# Patient Record
Sex: Male | Born: 1950 | Race: Black or African American | Hispanic: No | Marital: Single | Smoking: Former smoker
Health system: Southern US, Community
[De-identification: ages and names within clinical notes are randomized; demographics above are authoritative.]

## PROBLEM LIST (undated history)

## (undated) DIAGNOSIS — I219 Acute myocardial infarction, unspecified: Secondary | ICD-10-CM

## (undated) DIAGNOSIS — E785 Hyperlipidemia, unspecified: Secondary | ICD-10-CM

## (undated) DIAGNOSIS — I509 Heart failure, unspecified: Secondary | ICD-10-CM

## (undated) DIAGNOSIS — Z992 Dependence on renal dialysis: Secondary | ICD-10-CM

## (undated) DIAGNOSIS — M109 Gout, unspecified: Secondary | ICD-10-CM

## (undated) DIAGNOSIS — I10 Essential (primary) hypertension: Secondary | ICD-10-CM

## (undated) DIAGNOSIS — F419 Anxiety disorder, unspecified: Secondary | ICD-10-CM

## (undated) DIAGNOSIS — N189 Chronic kidney disease, unspecified: Secondary | ICD-10-CM

## (undated) DIAGNOSIS — Z89511 Acquired absence of right leg below knee: Secondary | ICD-10-CM

## (undated) DIAGNOSIS — D649 Anemia, unspecified: Secondary | ICD-10-CM

## (undated) DIAGNOSIS — Z972 Presence of dental prosthetic device (complete) (partial): Secondary | ICD-10-CM

## (undated) DIAGNOSIS — G709 Myoneural disorder, unspecified: Secondary | ICD-10-CM

## (undated) DIAGNOSIS — I739 Peripheral vascular disease, unspecified: Secondary | ICD-10-CM

---

## 2008-05-09 DIAGNOSIS — I219 Acute myocardial infarction, unspecified: Secondary | ICD-10-CM

## 2008-05-09 HISTORY — DX: Acute myocardial infarction, unspecified: I21.9

## 2009-06-12 ENCOUNTER — Inpatient Hospital Stay: Payer: Self-pay | Admitting: Internal Medicine

## 2009-06-24 ENCOUNTER — Emergency Department: Payer: Self-pay | Admitting: Emergency Medicine

## 2009-06-29 ENCOUNTER — Inpatient Hospital Stay: Payer: Self-pay | Admitting: Internal Medicine

## 2009-07-09 ENCOUNTER — Emergency Department: Payer: Self-pay | Admitting: Emergency Medicine

## 2009-07-31 ENCOUNTER — Emergency Department: Payer: Self-pay | Admitting: Emergency Medicine

## 2009-08-10 ENCOUNTER — Emergency Department: Payer: Self-pay | Admitting: Emergency Medicine

## 2009-08-18 ENCOUNTER — Inpatient Hospital Stay: Payer: Self-pay | Admitting: Internal Medicine

## 2009-08-25 ENCOUNTER — Inpatient Hospital Stay: Payer: Self-pay | Admitting: Internal Medicine

## 2009-09-17 ENCOUNTER — Ambulatory Visit: Payer: Self-pay | Admitting: Vascular Surgery

## 2009-09-24 ENCOUNTER — Ambulatory Visit: Payer: Self-pay | Admitting: Vascular Surgery

## 2009-10-14 ENCOUNTER — Emergency Department: Payer: Self-pay | Admitting: Emergency Medicine

## 2009-10-17 ENCOUNTER — Emergency Department: Payer: Self-pay | Admitting: Internal Medicine

## 2009-10-21 ENCOUNTER — Ambulatory Visit: Payer: Self-pay | Admitting: Vascular Surgery

## 2009-12-22 ENCOUNTER — Inpatient Hospital Stay: Payer: Self-pay | Admitting: Internal Medicine

## 2010-01-08 ENCOUNTER — Emergency Department: Payer: Self-pay | Admitting: Emergency Medicine

## 2010-01-21 ENCOUNTER — Ambulatory Visit: Payer: Self-pay | Admitting: Internal Medicine

## 2010-06-17 ENCOUNTER — Ambulatory Visit: Payer: Self-pay | Admitting: Vascular Surgery

## 2010-08-31 ENCOUNTER — Ambulatory Visit: Payer: Self-pay | Admitting: Vascular Surgery

## 2010-11-14 ENCOUNTER — Emergency Department: Payer: Self-pay | Admitting: Internal Medicine

## 2010-12-06 ENCOUNTER — Ambulatory Visit: Payer: Self-pay | Admitting: Vascular Surgery

## 2011-01-19 ENCOUNTER — Inpatient Hospital Stay: Payer: Self-pay | Admitting: Internal Medicine

## 2011-02-17 ENCOUNTER — Ambulatory Visit: Payer: Self-pay | Admitting: Vascular Surgery

## 2011-06-11 ENCOUNTER — Emergency Department: Payer: Self-pay | Admitting: Emergency Medicine

## 2011-06-11 LAB — BASIC METABOLIC PANEL
Anion Gap: 15 (ref 7–16)
BUN: 44 mg/dL — ABNORMAL HIGH (ref 7–18)
Chloride: 102 mmol/L (ref 98–107)
Creatinine: 8.77 mg/dL — ABNORMAL HIGH (ref 0.60–1.30)
EGFR (Non-African Amer.): 7 — ABNORMAL LOW
Glucose: 97 mg/dL (ref 65–99)
Potassium: 4.5 mmol/L (ref 3.5–5.1)
Sodium: 139 mmol/L (ref 136–145)

## 2011-06-11 LAB — PROTIME-INR
INR: 1
Prothrombin Time: 13.6 secs (ref 11.5–14.7)

## 2011-06-11 LAB — CBC WITH DIFFERENTIAL/PLATELET
Basophil #: 0 10*3/uL (ref 0.0–0.1)
Eosinophil #: 0 10*3/uL (ref 0.0–0.7)
Eosinophil %: 0.5 %
HCT: 29.6 % — ABNORMAL LOW (ref 40.0–52.0)
HGB: 9.9 g/dL — ABNORMAL LOW (ref 13.0–18.0)
Lymphocyte %: 7.8 %
MCH: 32.7 pg (ref 26.0–34.0)
MCHC: 33.3 g/dL (ref 32.0–36.0)
MCV: 98 fL (ref 80–100)
Monocyte #: 0.7 10*3/uL (ref 0.0–0.7)
Monocyte %: 7.4 %
Neutrophil #: 8.5 10*3/uL — ABNORMAL HIGH (ref 1.4–6.5)
Neutrophil %: 84.1 %
RDW: 16.1 % — ABNORMAL HIGH (ref 11.5–14.5)
WBC: 10.1 10*3/uL (ref 3.8–10.6)

## 2011-06-19 ENCOUNTER — Ambulatory Visit: Payer: Self-pay | Admitting: Nephrology

## 2011-06-20 LAB — CBC WITH DIFFERENTIAL/PLATELET
Basophil #: 0.1 10*3/uL (ref 0.0–0.1)
Eosinophil #: 0.2 10*3/uL (ref 0.0–0.7)
HCT: 26.1 % — ABNORMAL LOW (ref 40.0–52.0)
Lymphocyte #: 1.3 10*3/uL (ref 1.0–3.6)
Lymphocyte %: 18.2 %
MCH: 31.7 pg (ref 26.0–34.0)
MCV: 97 fL (ref 80–100)
Monocyte %: 9.6 %
Platelet: 238 10*3/uL (ref 150–440)
RBC: 2.71 10*6/uL — ABNORMAL LOW (ref 4.40–5.90)
RDW: 15.8 % — ABNORMAL HIGH (ref 11.5–14.5)
WBC: 7.3 10*3/uL (ref 3.8–10.6)

## 2011-06-20 LAB — COMPREHENSIVE METABOLIC PANEL
Alkaline Phosphatase: 48 U/L — ABNORMAL LOW (ref 50–136)
Anion Gap: 14 (ref 7–16)
Bilirubin,Total: 0.3 mg/dL (ref 0.2–1.0)
Chloride: 101 mmol/L (ref 98–107)
Co2: 23 mmol/L (ref 21–32)
Creatinine: 7.94 mg/dL — ABNORMAL HIGH (ref 0.60–1.30)
EGFR (African American): 9 — ABNORMAL LOW
EGFR (Non-African Amer.): 7 — ABNORMAL LOW
Osmolality: 281 (ref 275–301)
Potassium: 3.6 mmol/L (ref 3.5–5.1)
SGOT(AST): 33 U/L (ref 15–37)
SGPT (ALT): 22 U/L
Total Protein: 7.9 g/dL (ref 6.4–8.2)

## 2011-06-20 LAB — URIC ACID: Uric Acid: 2.8 mg/dL — ABNORMAL LOW (ref 3.5–7.2)

## 2011-06-21 ENCOUNTER — Inpatient Hospital Stay: Payer: Self-pay | Admitting: Internal Medicine

## 2011-06-21 LAB — VANCOMYCIN, RANDOM: Vancomycin, Random: 39 ug/mL

## 2011-06-22 LAB — CBC WITH DIFFERENTIAL/PLATELET
Basophil %: 0.9 %
Eosinophil #: 0.2 10*3/uL (ref 0.0–0.7)
HCT: 28 % — ABNORMAL LOW (ref 40.0–52.0)
Lymphocyte #: 1.8 10*3/uL (ref 1.0–3.6)
Lymphocyte %: 22.9 %
MCH: 31.5 pg (ref 26.0–34.0)
MCV: 97 fL (ref 80–100)
Monocyte #: 0.8 10*3/uL — ABNORMAL HIGH (ref 0.0–0.7)
Monocyte %: 10 %
Neutrophil %: 63.1 %
Platelet: 289 10*3/uL (ref 150–440)
RDW: 16 % — ABNORMAL HIGH (ref 11.5–14.5)
WBC: 8 10*3/uL (ref 3.8–10.6)

## 2011-06-22 LAB — PHOSPHORUS: Phosphorus: 6.6 mg/dL — ABNORMAL HIGH (ref 2.5–4.9)

## 2011-06-24 LAB — PHOSPHORUS: Phosphorus: 4.1 mg/dL (ref 2.5–4.9)

## 2011-06-26 LAB — CULTURE, BLOOD (SINGLE)

## 2011-07-21 ENCOUNTER — Ambulatory Visit: Payer: Self-pay | Admitting: Vascular Surgery

## 2011-08-02 ENCOUNTER — Encounter: Payer: Self-pay | Admitting: Nephrology

## 2011-08-08 ENCOUNTER — Encounter: Payer: Self-pay | Admitting: Nephrology

## 2011-09-07 ENCOUNTER — Encounter: Payer: Self-pay | Admitting: Nephrology

## 2011-12-13 ENCOUNTER — Ambulatory Visit: Payer: Self-pay | Admitting: Vascular Surgery

## 2012-01-31 ENCOUNTER — Ambulatory Visit: Payer: Self-pay | Admitting: Vascular Surgery

## 2012-09-15 ENCOUNTER — Emergency Department: Payer: Self-pay | Admitting: Emergency Medicine

## 2012-09-18 ENCOUNTER — Ambulatory Visit: Payer: Self-pay | Admitting: Vascular Surgery

## 2012-10-09 DIAGNOSIS — I10 Essential (primary) hypertension: Secondary | ICD-10-CM | POA: Diagnosis present

## 2012-10-09 DIAGNOSIS — N186 End stage renal disease: Secondary | ICD-10-CM

## 2012-10-09 DIAGNOSIS — M109 Gout, unspecified: Secondary | ICD-10-CM | POA: Diagnosis present

## 2013-02-19 ENCOUNTER — Ambulatory Visit: Payer: Self-pay | Admitting: Vascular Surgery

## 2013-12-24 ENCOUNTER — Ambulatory Visit: Payer: Self-pay | Admitting: Vascular Surgery

## 2014-01-10 ENCOUNTER — Ambulatory Visit: Payer: Self-pay | Admitting: Vascular Surgery

## 2014-01-16 ENCOUNTER — Inpatient Hospital Stay: Payer: Self-pay | Admitting: Internal Medicine

## 2014-01-16 LAB — RENAL FUNCTION PANEL
ANION GAP: 13 (ref 7–16)
Albumin: 3.1 g/dL — ABNORMAL LOW (ref 3.4–5.0)
BUN: 98 mg/dL — ABNORMAL HIGH (ref 7–18)
Calcium, Total: 8.6 mg/dL (ref 8.5–10.1)
Chloride: 107 mmol/L (ref 98–107)
Co2: 19 mmol/L — ABNORMAL LOW (ref 21–32)
Creatinine: 18.35 mg/dL — ABNORMAL HIGH (ref 0.60–1.30)
EGFR (African American): 3 — ABNORMAL LOW
GFR CALC NON AF AMER: 2 — AB
Glucose: 121 mg/dL — ABNORMAL HIGH (ref 65–99)
Osmolality: 309 (ref 275–301)
POTASSIUM: 6.2 mmol/L — AB (ref 3.5–5.1)
Phosphorus: 5.6 mg/dL — ABNORMAL HIGH (ref 2.5–4.9)
SODIUM: 139 mmol/L (ref 136–145)

## 2014-01-16 LAB — CBC WITH DIFFERENTIAL/PLATELET
Basophil #: 0 10*3/uL (ref 0.0–0.1)
Basophil %: 0.4 %
EOS ABS: 0 10*3/uL (ref 0.0–0.7)
Eosinophil %: 0.2 %
HCT: 30.2 % — AB (ref 40.0–52.0)
HGB: 9.5 g/dL — ABNORMAL LOW (ref 13.0–18.0)
LYMPHS ABS: 0.6 10*3/uL — AB (ref 1.0–3.6)
Lymphocyte %: 5.6 %
MCH: 29.6 pg (ref 26.0–34.0)
MCHC: 31.4 g/dL — ABNORMAL LOW (ref 32.0–36.0)
MCV: 94 fL (ref 80–100)
MONOS PCT: 0.8 %
Monocyte #: 0.1 x10 3/mm — ABNORMAL LOW (ref 0.2–1.0)
Neutrophil #: 9.1 10*3/uL — ABNORMAL HIGH (ref 1.4–6.5)
Neutrophil %: 93 %
PLATELETS: 229 10*3/uL (ref 150–440)
RBC: 3.21 10*6/uL — AB (ref 4.40–5.90)
RDW: 14.9 % — AB (ref 11.5–14.5)
WBC: 9.8 10*3/uL (ref 3.8–10.6)

## 2014-01-16 LAB — POTASSIUM
POTASSIUM: 6.3 mmol/L — AB (ref 3.5–5.1)
POTASSIUM: 4 mmol/L (ref 3.5–5.1)
Potassium: 7.3 mmol/L (ref 3.5–5.1)

## 2014-01-17 LAB — BASIC METABOLIC PANEL
Anion Gap: 10 (ref 7–16)
BUN: 62 mg/dL — ABNORMAL HIGH (ref 7–18)
CREATININE: 12.3 mg/dL — AB (ref 0.60–1.30)
Calcium, Total: 8.2 mg/dL — ABNORMAL LOW (ref 8.5–10.1)
Chloride: 102 mmol/L (ref 98–107)
Co2: 27 mmol/L (ref 21–32)
EGFR (African American): 4 — ABNORMAL LOW
GFR CALC NON AF AMER: 4 — AB
Glucose: 101 mg/dL — ABNORMAL HIGH (ref 65–99)
OSMOLALITY: 295 (ref 275–301)
Potassium: 4.8 mmol/L (ref 3.5–5.1)
Sodium: 139 mmol/L (ref 136–145)

## 2014-01-17 LAB — CBC WITH DIFFERENTIAL/PLATELET
BASOS PCT: 0.5 %
Basophil #: 0.1 10*3/uL (ref 0.0–0.1)
Eosinophil #: 0 10*3/uL (ref 0.0–0.7)
Eosinophil %: 0.1 %
HCT: 27.9 % — ABNORMAL LOW (ref 40.0–52.0)
HGB: 8.8 g/dL — ABNORMAL LOW (ref 13.0–18.0)
LYMPHS ABS: 1.5 10*3/uL (ref 1.0–3.6)
LYMPHS PCT: 12.1 %
MCH: 29.4 pg (ref 26.0–34.0)
MCHC: 31.5 g/dL — AB (ref 32.0–36.0)
MCV: 93 fL (ref 80–100)
MONO ABS: 1.2 x10 3/mm — AB (ref 0.2–1.0)
Monocyte %: 10 %
Neutrophil #: 9.4 10*3/uL — ABNORMAL HIGH (ref 1.4–6.5)
Neutrophil %: 77.3 %
PLATELETS: 212 10*3/uL (ref 150–440)
RBC: 3 10*6/uL — AB (ref 4.40–5.90)
RDW: 15.4 % — AB (ref 11.5–14.5)
WBC: 12.2 10*3/uL — AB (ref 3.8–10.6)

## 2014-05-27 ENCOUNTER — Ambulatory Visit: Payer: Self-pay | Admitting: Vascular Surgery

## 2014-08-26 NOTE — Op Note (Signed)
PATIENT NAME:  Johnny [name], Johnny [name] MR#:  [id number] DATE OF BIRTH:  Jun 21, 1950  DATE OF PROCEDURE:  12/13/2011  PREOPERATIVE DIAGNOSES:  1. Complication AV dialysis fistula.  2. Stricture stenosis AV fistula, left arm.  3. End-stage renal disease requiring hemodialysis.   POSTOPERATIVE DIAGNOSES: 1. Complication AV dialysis fistula.  2. Stricture stenosis AV fistula, left arm.  3. End-stage renal disease requiring hemodialysis.   PROCEDURES PERFORMED:  1. Contrast injection left brachiocephalic fistula.  2. Percutaneous transluminal angioplasty arterial anastomosis, left arm brachiocephalic fistula.   PROCEDURE PERFORMED BY: Katha Cabal, MD   SEDATION: Versed 3 mg plus fentanyl 100 mcg administered IV pain. Continuous ECG, pulse oximetry, and cardiopulmonary monitoring was performed throughout the entire procedure by the interventional radiology nurse. Total sedation time is 30 minutes.   ACCESS: 6 French sheath initially antegrade and then flipped retrograde.   CONTRAST USED: Isovue 34 mL.   FLUORO TIME: 2.7 minutes.   INDICATIONS: [name] is a 64 year old gentleman who has been having increasing difficulties at his dialysis center with poor flows from his fistula. He was sent back to the office where duplex ultrasound demonstrated a recurrence of his stricture and he is, therefore, undergoing angiography to confirm this and hopefully for intervention. Risks and benefits were reviewed. All questions answered. The patient agrees to proceed.   PROCEDURE: The patient is taken to Special Procedures and placed in the supine position. After adequate sedation is achieved, he is positioned with his left arm extended palm upward. Left arm is prepped and draped in a sterile fashion. 1% lidocaine is infiltrated in the soft tissues overlying the fistula and a micropuncture needle is used to access the fistula in an antegrade direction, microwire followed by micro sheath, J-wire followed by 6  French sheath. Hand injection of contrast is then used to demonstrate the fistula as well as the central veins. Both an AP as well as oblique view of the cephalic confluence is obtained. There does not appear to be any flow limiting stenosis at the cephalic confluence although there is some irregularity. This is re-examined in the steep oblique projection. Subsequently, compression is held upon the fistula and reflux imaging is obtained which demonstrates high-grade greater than 75% stenosis at the arterial anastomosis. This does correlate with the ultrasound and the velocities of 700 cm/sec.   Using a stiff angled Glidewire and a rim catheter, the 6 French sheath is flipped into the retrograde direction. Magic torque wire is then advanced out the distal brachial artery. 3000 units of heparin is given and a 6 x 6 Dorado balloon is advanced across the area of stenosis. Inflation is then made to 16 to 18 atmospheres for one minute. Follow-up angiography with reflux imaging and proximal compression demonstrates significant improvement with a lumen of approximately 5 to 6 mm throughout its course more than adequate as inflow for the fistula.   Purse-string suture is placed around the sheath. Sheath is pulled, pressure is held, and there are no immediate complications.   INTERPRETATION: Initial images of the fistula demonstrate the two areas of access for the fistula are mildly dilated measuring approximately 12 mm in diameter. There are no strictures within the cephalic vein itself. At the cephalic subclavian confluence there is some mild irregularity but there appears to be rapid flow without any evidence of a high-grade stricture or flow-limiting stenosis. Central veins are widely patent. Reflux images demonstrate high-grade inflow stenosis which would account for the low flows they have been seeing at dialysis.  Following angioplasty to 6 mm there is significant resolution with luminal gain to approximately 5.5  mm.   SUMMARY: Successful salvage of left arm brachiocephalic fistula with angioplasty to 6 mm at the arterial anastomosis.   ____________________________ Katha Cabal, MD ggs:drc D: 12/13/2011 10:51:30 ET T: 12/13/2011 13:23:48 ET JOB#: [id number]  cc: Katha Cabal, MD, <Dictator>, [name], MD, Munsoor Lilian Kapur, MD Katha Cabal MD ELECTRONICALLY SIGNED 12/18/2011 13:21

## 2014-08-26 NOTE — Op Note (Signed)
PATIENT NAME:  Johnny [name], Johnny [name] MR#:  [id number] DATE OF BIRTH:  July 16, 1950  DATE OF PROCEDURE:  01/31/2012  PREOPERATIVE DIAGNOSES:  1. Poorly functioning dialysis access with inability to cannulate.  2. End-stage renal disease requiring hemodialysis.   POSTOPERATIVE DIAGNOSES:  1. Poorly functioning dialysis access with inability to cannulate.  2. End-stage renal disease requiring hemodialysis.   PROCEDURES PERFORMED:  1. Contrast injection left arm brachiocephalic fistula.  2. Percutaneous transluminal angioplasty of the arterial anastomosis to 8 mm.  3. Percutaneous transluminal angioplasty of the venous portion of the fistula to 8 mm.  4. Percutaneous transluminal angioplasty of the cephalic confluence with the subclavian vein and the mid subclavian vein to 8 mm.  PROCEDURE PERFORMED BY: Katha Cabal, MD   SEDATION: Versed 4 mg plus fentanyl 150 mcg administered IV. Continuous ECG, pulse oximetry, and cardiopulmonary monitoring was performed throughout the entire procedure by the interventional radiology nurse. Total sedation time was one hour.   ACCESS:  1. 7 French sheath, left arm brachiocephalic fistula, retrograde direction.  2. 6 French sheath, left arm brachiocephalic fistula, antegrade direction.   CONTRAST USED: Isovue 65 mL.   FLUORO TIME: 5.0 minutes.   INDICATIONS: Johnny [name] is a 64 year old gentleman who is maintained on hemodialysis via a left arm brachiocephalic fistula. Recently they have been having increasing problems with cannulation and they have requested evaluation. Duplex ultrasound suggests high-grade strictures at both the arterial as well as venous portions and a suspicious area within the midportion of the fistula. The risks and benefits for angiography and intervention were reviewed. All questions answered. The patient agrees to proceed.   PROCEDURE: The patient is taken to Special Procedures and placed in the supine position. After adequate  sedation is achieved, he is positioned supine with his left arm extended palm upward. Left arm is prepped and draped in a sterile fashion. High up on the arm in a retrograde direction 1% lidocaine is infiltrated in the soft tissues and a micropuncture needle is used to access the graft, microwire followed by micro sheath, J-wire followed by 6 French sheath is inserted. The Magic torque wire is then negotiated into the brachial artery with the KMP catheter. KMP catheter is then used positioned at the anastomosis to demonstrate the arterial portion of the graft and the anastomosis itself. There is a recurrent high-grade stricture now measuring less than 3 mm in diameter at the level of the anastomosis and extending several centimeters into the fistula. A 7 x 6 Dorado balloon is then advanced over a wire which has been introduced through the KMP catheter. 3000 units of heparin has been given. Balloon inflation is to 16 to 18 atmospheres. Follow-up demonstrates modest improvement, however, it still appears to be quite undersized and, therefore, an 8 x 6 balloon is advanced through the 6 French sheath and inflated again extending from the anastomosis which is somewhat bulbous across this high-grade stricture and treating the arterial portion. Follow-up angiography with the KMP catheter reintroduced demonstrates wide patency now with the general diameter of this area to be 6 mm.   Imaging now more proximally on the arm within the zone of cannulation demonstrates a high-grade stricture and attempts at reintroducing the 8 mm balloon through the 6 French sheath are unsuccessful and, therefore, the 6 [place] sheath is exchanged for a 7 [place] sheath and the 8 mm balloon is reintroduced and this area between what appears to be the arterial cannulation, the venous cannulation is postdilated to 8 mm. Result  is quite good and attempts flipping the sheath from retrograde to antegrade are made, however, this does not work and  control of the puncture site for this sheath is obtained with an interrupted Monocryl suture.   Site is selected more distally on the arm just at the beginning of the pseudoaneurysm for the arterial cannulation and lidocaine is infiltrated. Micro sheath is inserted, microwire followed by micro sheath, J-wire followed by 6 French sheath is inserted in the antegrade direction.   Two more strictures are noted within the venous cannulation zone and these are treated with the 8 mm balloon. There is an area which appears to be stenotic at the confluence of the cephalic vein with the subclavian and this is treated by advancing the 8 x 6 balloon into the subclavian, crossing the confluence, and inflating it to 20 atmospheres for one minute. Follow-up angiography demonstrates wide patency of all these areas that have been angioplastied and palpation demonstrates a soft continuous thrill with minimal pulsatility. It should be noted the graft was extremely pulsatile by palpation at the beginning of the procedure.   After reviewing all the images, this second sheath is pulled after purse-string suture of Monocryl is placed. There are no immediate complications.   INTERPRETATION: As noted, the initial puncture was in the retrograde direction with the KMP catheter and the brachial artery images are obtained demonstrating recurrence at the arterial anastomosis. This is treated up to 8 mm. Areas within the zone of cannulation are then treated with the 8 mm balloon. Area within the confluence is treated with 8 mm inflation.   Central veins are all widely patent as are the visualized portions of the brachial, radial, and ulnar arteries.   SUMMARY: Successful salvage of the left arm brachiocephalic fistula with angioplasty at multiple sites as described above.   ____________________________ Katha Cabal, MD ggs:drc D: 01/31/2012 18:05:17 ET T: 02/01/2012 11:42:00 ET JOB#: [id number]  cc: Katha Cabal, MD,  <Dictator> [name], MD Katha Cabal MD ELECTRONICALLY SIGNED 02/07/2012 14:06

## 2014-08-29 NOTE — Op Note (Signed)
PATIENT NAME:  Johnny [name], Johnny [name] Johnny#:  [id number] DATE OF BIRTH:  1951-04-15  DATE OF PROCEDURE:  02/19/2013  PREOPERATIVE DIAGNOSES: 1.  Complication left arm arteriovenous fistula.  2.  End-stage renal disease requiring hemodialysis.   POSTOPERATIVE DIAGNOSES: 1.  Complication left arm arteriovenous fistula.  2.  End-stage renal disease requiring hemodialysis.   PROCEDURES PERFORMED: 1.  Contrast injection, left brachiocephalic fistula.  2.  Percutaneous transluminal angioplasty, left brachiocephalic fistula to 7 mm at the cephalic subclavian confluence.  3.  Placement of a Viabahn stent for failed angioplasty subclavian cephalic confluence.   SURGEON:  Hortencia Pilar, MD  SEDATION:  Versed 4 mg plus fentanyl 150 mcg administered IV. Continuous ECG, pulse oximetry and cardiopulmonary monitoring is performed throughout the entire procedure by the interventional radiology nurse. Total sedation time was one hour.   ACCESS: A 7 French sheath left brachiocephalic fistula.   CONTRAST USED: Isovue 24 mL.   FLUOROSCOPY TIME: 2.9 minutes.   INDICATIONS: Johnny [name] is a 64 year old gentleman who has been having increasing problems at dialysis with prolonged bleeding as well as poor dialysis runs. The risks and benefits for angiography and possible intervention were reviewed. All questions answered. The patient has agreed to proceed.   DESCRIPTION OF PROCEDURE: The patient is taken to special procedures and placed in the supine position. After adequate sedation is achieved, he is positioned supine with his left arm extended palm upward. The left arm is prepped and draped in sterile fashion. 1% lidocaine is infiltrated in the soft tissue and access to the fistula is obtained with a micropuncture needle, microwire followed by micro sheath, J-wire followed by a 6 French sheath.   Hand injection of contrast is then utilized to demonstrate the fistula itself as well as the central venous anatomy. Of  note, there is a greater than 90% stenosis at the subclavian cephalic confluence. Central veins are widely patent otherwise. The previously placed fluency stent in the venous portion of his fistula is widely patent without evidence of restenosis at either end.  Pseudoaneurysm is noted of the arterial cannulation site.   Heparin 3000 units is given. A Magic torque wire is then advanced into the central venous anatomy, and a 7 x 6 balloon is advanced across the lesion at the subclavian cephalic confluence and inflated to 16 atmospheres for 1 minute. Follow-up angiography demonstrates absolutely no change in the appearance of the lesion. Clinical examination of the fistula demonstrates the fistula remains strongly pulsatile with a staccato thrill to it.   The sheath is then upsized to a 7 [place] sheath, and the wire is exchanged through a KMP catheter for a 0.018 wire. An 8 x 50 Viabahn stent is then advanced across the lesion into the subclavian cephalic confluence deployed without difficulty, and an 8 x 40 Dorado balloon is used to postdilate the stenotic area. In order to fully expand the stent a 28 atmosphere inflation is needed and it is left inflated for approximately 1 minute. Followup angiography now demonstrates complete resolution of this stricture with good apposition of the stent and widely patent cephalic subclavian confluence. With manual pressure, reflux imaging is used to demonstrate the arterial anastomosis.   Pursestring suture, 4-0 Monocryl was placed, sheath is removed. Light pressure is held. There are no immediate complications.   INTERPRETATION: Initial images as described above. There is a previously placed fluency stent, which is widely patent. No evidence of recurrent stenosis at either end.  There is pseudoaneurysmal dilatation at the arterial cannulation  site and on reflux images, there is some scarring of the arterial portion at the level of the anastomosis, but this does not appear  to be flow-limiting at this time. Visualized portions of the brachial artery are widely patent. The subclavian cephalic confluence has a greater than 90% stenosis. This fails to respond to angioplasty and, therefore, Viabahn stent is placed and a Dorado balloon is utilized. Central veins are widely patent.   SUMMARY: Successful salvage of left brachiocephalic fistula with placement of a Viabahn stent at the subclavian cephalic confluence.     ____________________________ Katha Cabal, MD ggs:dp D: 02/20/2013 10:13:00 ET T: 02/20/2013 10:43:47 ET JOB#: [id number]  cc: [name], MD Katha Cabal, MD, <Dictator>  Katha Cabal MD ELECTRONICALLY SIGNED 03/15/2013 8:13

## 2014-08-29 NOTE — Op Note (Signed)
PATIENT NAME:  Johnny [name], Johnny [name] MR#:  [id number] DATE OF BIRTH:  03-Mar-1951  DATE OF PROCEDURE:  09/18/2012  PREOPERATIVE DIAGNOSES:  1. Complication of arteriovenous dialysis device.  2. End-stage renal disease requiring hemodialysis.  3. Painful left upper extremity.   POSTOPERATIVE DIAGNOSES:  1. Complication of arteriovenous dialysis device.  2. End-stage renal disease requiring hemodialysis.  3. Painful left upper extremity.   PROCEDURES PERFORMED:  1. Contrast injection, left brachiocephalic fistula.  2. Percutaneous transluminal angioplasty venous portion left brachiocephalic fistula.  3. Placement of a FLAIR stent for failed angioplasty of the left brachiocephalic fistula venous portion. 4. Percutaneous transluminal angioplasty of the cephalic subclavian confluence.   SURGEON: Katha Cabal, MD   SEDATION: Versed 4 mg plus fentanyl 150 mcg administered IV. Continuous ECG, pulse oximetry and cardiopulmonary monitoring is performed throughout the entire procedure by the interventional radiology nurse. Total sedation time was 45 minutes.   ACCESS: A 7-French sheath left brachiocephalic fistula antegrade direction.   CONTRAST USED: Isovue 35 mL.   FLUOROSCOPY TIME: 6.2 minutes.   INDICATIONS: Mr. Stjean is a 64 year old gentleman who has been having increasing difficulty at dialysis with difficult cannulations, poor KT/v and increasing pain in his arm while on dialysis. Risks and benefits for angiography with the hope for intervention and salvage of his fistula is reviewed. All questions answered. The patient agrees to proceed.   DESCRIPTION OF PROCEDURE: The patient is taken to special procedures and placed in the supine position. After adequate sedation is achieved, he is positioned supine with his left arm extended palm upward. The left arm is prepped and draped in sterile fashion. 1% lidocaine is infiltrated into the soft tissues overlying the palpable fistula near the  arterial anastomosis and in an antegrade direction, a micro needle is inserted, micro wire, followed by a MicroSheath, J-wire followed by 6-French sheath. Hand injection of contrast is then utilized to demonstrate the fistula as well as the central veins. Two separate and distinct lesions are noted, one in the venous cannulation site which appears to be 4 to 5 cm in length and approximately 90 degrees, the other at the cephalic confluence which appears to be focal, likely a sclerotic valve.   Heparin 3,000 units is given. Magic Torque wire is negotiated into the central venous system. Initially Glidewire, oblique view and a KMP catheter are required to get past the venous lesion.   A 7 x 6 Dorado balloon is then inflated across the venous lesion. There is very little improvement and therefore an 8 x 70 straight Flair stent is advanced over the wire. across the lesion and deployed without difficulty. It is then post dilated using initially an 8 mm Rival balloon, but then an 8 mm Dorado balloon.   Follow-up angiography demonstrates complete resolution of the stenotic area and subsequently, the 7 mm balloon is reintroduced and advanced up into the subclavian vein at the cephalic confluence and angioplasty is performed to 18 atmospheres for 1 full minute. Follow-up angiography demonstrates complete resolution of this lesion as well.   By palpation, there is now a continuous soft thrill with minimal pulsatility significantly improved clinically over the initial status of the fistula.   Pursestring suture of 4-0 Monocryl is placed and the sheath is removed. There are no immediate complications.   INTERPRETATION: Initial views demonstrate there is a 90% stenosis in the venous cannulation zone of the brachiocephalic fistula extending for 4 to 5 cm. There are several pseudoaneurysms identified as well. The more proximal  cephalic vein is widely patent, cephalic confluence has an 80% stricture that appears to be a  sclerotic valve, central veins are widely patent. Following angioplasty, stent placement and then post dilatation to 8 mm, there is resolution of the venous lesion. Following angioplasty of 7 mm of the cephalic confluence, there is resolution of this lesion as well.   SUMMARY: Successful salvage of left arm brachiocephalic fistula as described above.    ____________________________ Katha Cabal, MD ggs:es D: 09/18/2012 10:24:32 ET T: 09/18/2012 10:54:32 ET JOB#: [id number]  cc: Katha Cabal, MD, <Dictator> Munsoor Lilian Kapur, MD [name], MD Katha Cabal MD ELECTRONICALLY SIGNED 09/25/2012 9:26

## 2014-08-30 NOTE — Op Note (Signed)
PATIENT NAME:  Johnny [name], Johnny [name] MR#:  [id number] DATE OF BIRTH:  Sep 23, 1950  DATE OF PROCEDURE:  01/16/2014  PREOPERATIVE DIAGNOSIS:    1.  End-stage renal disease. 2.  Hyperkalemia precluding dialysis access declot. 3.  Hypertension.  POSTOPERATIVE DIAGNOSIS:  1.  End-stage renal disease. 2.  Hyperkalemia precluding dialysis access declot. 3.  Hypertension.  PROCEDURES:  1. Ultrasound guidance for vascular access to the right femoral vein.  2. Placement of non-tunneled dialysis catheter into the right femoral vein.  SURGEON:  Algernon Huxley, M.D.   ANESTHESIA: Local.   BLOOD LOSS: Minimal.   INDICATION FOR PROCEDURE: 64 year old African American gentleman who was scheduled for dialysis fistula declot today.  His potassium, however, was greater than 7 which precluded safe access declot.  He will have to have a temporary catheter placed and get emergent dialysis to lower his potassium and a declot will be scheduled at a later time.    DESCRIPTION OF THE PROCEDURE: The patient's right groin was sterilely prepped and draped and a sterile surgical field was created. The right femoral vein was visualized with ultrasound and found to be patent. It was then accessed under direct ultrasound guidance without difficulty with a Seldinger needle and a permanent image was recorded. A J-wire was placed after skin nick and dilatation. The 30 cm Trialysis dialysis catheter was then placed over the wire and the wire was removed. All lumens withdrew blood well and flushed easily with sterile saline. It was secured to the skin with 3 nylon sutures. A sterile dressing was placed. The patient tolerated the procedure well and remained in his bed in a stable condition.    ____________________________ Algernon Huxley, MD jsd:nr D: 01/16/2014 15:56:56 ET T: 01/16/2014 23:49:15 ET JOB#: [id number]  cc: Algernon Huxley, MD, <Dictator> Algernon Huxley MD ELECTRONICALLY SIGNED 01/20/2014 15:06

## 2014-08-30 NOTE — Op Note (Signed)
PATIENT NAME:  Johnny [name], SALONE MR#:  [id number] DATE OF BIRTH:  05-23-1950  DATE OF PROCEDURE:  01/10/2014  PREOPERATIVE DIAGNOSES:  1.  Poorly functioning dialysis access.  2.  End-stage renal disease requiring hemodialysis.    POSTOPERATIVE DIAGNOSES:  1.  Poorly functioning dialysis access.  2.  End-stage renal disease requiring hemodialysis.    PROCEDURES PERFORMED:  1.  Contrast injection, left brachiocephalic fistula.  2.  Percutaneous transluminal angioplasty and stent placement of the cephalic subclavian confluence.  3.  Percutaneous transluminal angioplasty of the arterial portion.   SURGEON:  Katha Cabal, MD    SEDATION:  Versed 4 mg plus fentanyl 150 mcg administered IV. Continuous ECG, pulse oximetry and cardiopulmonary monitoring was performed throughout the entire procedure by interventional radiology nurse. Total sedation time was 1 hour.   ACCESS:  A 7 French sheath, antegrade direction, left arm brachiocephalic fistula.   FLUOROSCOPY TIME: 6.6 minutes.   CONTRAST USED: Isovue 45 mL.   INDICATIONS: Johnny [name] is a 64 year old gentleman who was sent from dialysis with the presumption of thrombosis of his AV fistula. Upon evaluation the patient has a strongly pulsatile fistula, but it does not appear to be thrombosed. The risks and benefits for angiography and intervention were reviewed. All questions answered. The patient agrees to proceed.   DESCRIPTION OF PROCEDURE: The patient is taken to the special procedure suite, placed in the supine position. After adequate sedation is achieved, the patient is positioned supine with his left arm extended palm upward. The left arm is prepped and draped in a sterile fashion. Appropriate timeout is called.    Lidocaine 1% is infiltrated in the soft tissues overlying the arterial portion and a micropuncture needle is inserted, microwire followed by microsheath. Hand injection of contrast is used to verify patency of the fistula  and subsequently a wire is advanced, and a 6 [place] sheath is inserted. Formal hand injection is then utilized to demonstrate the entire fistula as well as the central veins.   The fistula demonstrates two lesions therefore 3000 units of heparin is given. The first lesion noted is at the cephalic subclavian confluence within the previously placed stent. This area was angioplastied approximately 1 month ago but I elected at that time not to introduce a second stent.   A second area was the leading edge of the previously placed Flair stents within the arterial portion of the graft approximately 2 cm above the anastomosis with the brachial artery. This appears to have been crushed or deformed.   Given the recent failed angioplasty, I will primarily stent  the cephalic subclavian confluence, therefore, a 6 French sheath is exchanged for a 7 French sheath and a 8 x 100 Viabahn stent is opened onto the field. Catheter is advanced over the wire after the 7 French sheath has been inserted and a V-18 wire is exchanged. Stent is then advanced over the V-18, positioned and deployed completely covering the previous stent. It is post dilated with an 8 x 6 Dorado. Followup imaging demonstrates complete resolution of this narrowing with treatment of the proximal lesion.   Attention is then turned to the area of stent deformation in the arterial portion. Initially the 8 mm Dorado balloon is used, but this is inadequate and subsequently a 9 x 2 balloon is advanced across this area and inflated to 30 atmospheres for 2 minutes. Followup imaging demonstrates significant improvement in the area. There now appears to be adequate re-expansion of the stent.   The  8 x 6 balloon is then used to just angioplasty the remaining portion of the Flair/Fluency stents to fully expand them as well.   Followup imaging demonstrates that there is now rapid flow through the fistula with excellent filling and therefore the wire is removed,  pursestring suture is placed around the sheath and the sheath is removed. There are no immediate complications.   INTERPRETATION: Initial views demonstrate a high-grade critical stenosis at the subclavian cephalic confluence and this is effectively treated with a second stent and then expanded or post dilated with an 8 mm Dorado balloon. Central veins are otherwise widely patent. The previously stented venous portion appears patent; however, in the arterial portion there appears to be a significant deformation of the leading edge of the stent and this is treated first with an 8 mm, but then a 9 mm balloon for fully re-expanding the stent. At the conclusion of the procedure there is a widely patent access.   ____________________________ Katha Cabal, MD ggs:aw D: 01/10/2014 17:36:39 ET T: 01/10/2014 23:12:39 ET JOB#: [id number]  cc: Katha Cabal, MD, <Dictator> Katha Cabal MD ELECTRONICALLY SIGNED 01/14/2014 22:40

## 2014-08-30 NOTE — Op Note (Signed)
PATIENT NAME:  Johnny [name], Johnny [name] MR#:  [id number] DATE OF BIRTH:  07/17/1950  DATE OF PROCEDURE:  12/24/2013  PREOPERATIVE DIAGNOSES:  1. Complication arteriovenous dialysis device with poor flows and difficult access.  2. 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  3. End-stage renal disease requiring hemodialysis.   POSTOPERATIVE DIAGNOSIS:  1. Complication arteriovenous dialysis device with poor flows and difficult access.  2. 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  3. End-stage renal disease requiring hemodialysis.   PROCEDURES PERFORMED:  1. Contrast injection, left brachiocephalic fistula.  2. Percutaneous transluminal angioplasty and stent placement of the arterial portion for treatment of a stricture stenosis combined with aneurysmal disease.  3. Percutaneous transluminal angioplasty of the subclavian cephalic confluence.   SURGEON: Katha Cabal, MD.  SEDATION: Versed plus fentanyl. Continuous ECG, pulse oximetry and cardiopulmonary monitoring is performed throughout the entire procedure by the interventional radiology nurse.   TOTAL SEDATION TIME: 1 hour.   ACCESS: A 7 French sheath, antegrade direction, left arm fistula.   CONTRAST USED: Isovue 40 mL.   FLUOROSCOPY TIME: 6.2 minutes.   INDICATIONS: Johnny [name] is a 64 year old gentleman who has been having increasing difficulties and more problems at dialysis related to his access. Risks and benefits for angiography and intervention for salvage of his access were reviewed. All questions answered. The patient agrees to proceed.   DESCRIPTION OF PROCEDURE: The patient is taken to Special Procedures Room and placed in the supine position. After adequate sedation is achieved, he is positioned supine with his left arm extended palm upward and left arm is prepped and draped in a sterile fashion. Appropriate timeout is called.   Lidocaine 1% is infiltrated in the soft tissues overlying  the fistula, just above the anastomosis and access with a micropuncture needle is obtained in an antegrade direction, microwire followed by micro sheath, J-wire followed by a 6=French sheath and hand injection of contrast is utilized to demonstrate the fistula. Multiple abnormalities are noted. There is a large aneurysm, which on physical examination does have deterioration of the skin in the arterial portion. This is associated with strictures, both pre- and post aneurysm. At the cephalic confluence in the previously stented segment, there is also a greater than 80% stenosis. Central veins are otherwise widely patent. Arterial anastomosis is imaged and it is widely patent as well.   Heparin 3000 units is given and a Magic torque wire is negotiated through the fistula. A total of 2 straight 8 mm diameter FLAIR stents are then deployed. One is an 8 x 70, 1 is an 8 x 40, and they completely cover and exclude the aneurysm. There are post dilated with an 8 mm Dorado balloon inflated up to 40 atmospheres. Inflations are for 1 minute. Several inflations are required. Attention is then turned to the subclavian cephalic lesion and this is angioplastied with an 8 mm Dorado balloon. Again, inflation is to approximately 30 atmospheres for 2 full minutes. Follow-up imaging now demonstrates that the aneurysm has been successfully occluded and the associated strictures have been well treated. At the subclavian cephalic confluence, there is now resolution of the lesion as well and the central veins remain widely patent. Pursestring suture is placed around the 7 French sheath, which was inserted after the stents were deployed and subsequently light pressure is held. There are no immediate complications.   INTERPRETATION: Initial images demonstrate multiple abnormalities, aneurysmal deterioration in the arterial portion associated with strictures, as well as a subcritical stenosis at the subclavian  cephalic confluence. Both of  these are treated as described above, with excellent result.   SUMMARY: Successful salvage of left brachiocephalic fistula.    ____________________________ Katha Cabal, MD ggs:ls D: 12/27/2013 07:55:54 ET T: 12/27/2013 08:16:03 ET JOB#: [id number]  cc: Katha Cabal, MD, <Dictator> Katha Cabal MD ELECTRONICALLY SIGNED 01/14/2014 22:37

## 2014-08-30 NOTE — H&P (Signed)
PATIENT NAME:  Johnny [name], Johnny [name] MR#:  [id number] DATE OF BIRTH:  1950-08-26  DATE OF ADMISSION:  01/16/2014  PRIMARY CARE PHYSICIAN: Dr. Holley Raring and Dr. Candiss Norse  NEPHROLOGIST: Dr. Candiss Norse   CARDIOLOGIST: Dr. Clayborn Bigness   CHIEF COMPLAINT: Hyperkalemia.   REFERRING PHYSICIAN: Dr. Lucky Cowboy  HISTORY OF PRESENT ILLNESS: This is a 64 year old male with history of cardiomyopathy, end-stage renal disease on hemodialysis for 6 years, hypertension and anemia of chronic disease. His dialysis days are Monday, Wednesday and Friday. Last dialysis was last week, Friday. On last Monday, which was 4 days ago, he went for hemodialysis and at the center they could not get any blood returned from his AV fistula. They tried to lyse the thrombus at the dialysis center but were not successful to get any blood returned so sent him home and advised him to get an appointment with vascular to have fistulogram, which he got an appointment today and so came to the hospital in the morning to have the procedure, for fistulogram done. He denies any shortness of breath, leg swelling or increase in the weight, was just feeling somewhat nauseous this morning, but denies any muscle weakness. Today, when he came for the procedure, before the procedure, vascular did his potassium level which was 7.3 and so they spoke to nephrology and instead of doing fistulogram just placed a temporary dialysis catheter in his right groin and suggested to get him admitted and start him on hemodialysis.   REVIEW OF SYSTEMS: CONSTITUTIONAL: Negative for fever, fatigue, weakness, pain or weight loss.  EYES: No blurring or double vision, discharge or redness.  EARS, NOSE, THROAT: No tinnitus, ear pain or hearing loss.  RESPIRATORY: No cough, wheezing, hemoptysis or shortness of breath.  CARDIOVASCULAR: No chest pain, orthopnea, edema, arrhythmia, palpitations.  GASTROINTESTINAL: No nausea, vomiting, diarrhea or abdominal pain.  GENITOURINARY: No dysuria, hematuria,  or increased frequency.  ENDOCRINE: No heat or cold intolerance.  SKIN: No acne, rashes, or lesions.  MUSCULOSKELETAL: No pain or swelling in the joints.  NEUROLOGIC: No numbness, weakness, tremor or vertigo.  PSYCHIATRIC: No anxiety, insomnia or bipolar disorder.   PAST MEDICAL HISTORY: 1.  C. diff in the past.  2.  Cardiomyopathy with ejection fraction 35%.  3.  Nonobstructive coronary artery disease. Cardiac catheterization in February 2011 showed ejection fraction 35% with some blockages, suggested to do medical management.  4.  Hypertension.  5.  End-stage renal disease on hemodialysis Monday, Wednesday an Friday for the last 6 years.  6.  Gout.  7.  Anemia of chronic disease.  8.  Secondary hyperparathyroidism.   PAST SURGICAL HISTORY: Left upper extremity fistula creation.   SOCIAL HISTORY: Denies drinking, smoking or illicit drug use. He is on disability. He used to be a Presenter, broadcasting.   FAMILY HISTORY: Positive for hypertension and heart disease in father. Mother had high blood pressure. Father died of unknown reason.  HOME MEDICATIONS:  1.  Sensipar 60 mg oral tablet once a day. 2.  Renvela 800 mg 3 tablets 3 times a day. 3.  Lamictal 2.5 mg oral once a day.  4.  Hydralazine 50 mg oral once a day.  5.  Dialysis vitamin C 1000 oral tablet once a day.  6.  Colcrys 0.6 mg oral tablet once a day. 7.  Clonazepam 0.5 mg oral tablet as needed to initiate sleep.  8.  Calcium acetate 667 mg oral tablet 2 times a day. 9.  Aspirin 81 mg once a day.  10.  Allopurinol  100 mg oral once a day.   PHYSICAL EXAMINATION: VITAL SIGNS: Temperature 97.9, pulse 67, respiration 18, blood pressure 215/99, and pulse ox 97 at rest on room air.  GENERAL: The patient is fully alert and oriented to time, place and person. Does not appear in any acute distress.  HEENT: Head and neck atraumatic. Conjunctivae pink. Oral mucosa moist.  NECK: Supple. No JVD.  LUNGS: Bilateral equal and clear air  entry.  HEART: S1 and S2 present, regular. No murmur.  ABDOMEN: Soft, nontender. Bowel sounds present. No organomegaly.  SKIN: No rashes.  LEGS: No edema.  NEUROLOGIC: Power 5/5. Follows commands. No tremor or rigidity.  JOINTS: No swelling or tenderness.  PSYCHIATRIC: Does not appear in any acute psychiatric illness at this time.  DIAGNOSTIC DATA: Important lab results: Potassium level 7.3, per vascular lab report. WBC 9.8, hemoglobin 9.5, platelet count 229,000, and MCV 94. All other lab reports are still pending.   ASSESSMENT AND PLAN: A 64 year old male with history of hypertension, end-stage renal disease on hemodialysis and coronary artery disease who came to the hospital to have fistulogram as his fistula was not working properly for dialysis and was found to have potassium of 7.3 so admitted for having urgent hemodialysis under medical service.  1.  Hyperkalemia. The patient is having temporary hemodialysis and started on hemodialysis during my examination. Currently having no complaints. Nephrology is following for this issue. Continue monitoring.  2.  End-stage renal disease on hemodialysis. As mentioned above, AV fistula is malfunctioning so will need to have evaluation of that likely tomorrow by vascular.  3.  Accelerated hypertension. Blood pressure is elevated, but did not receive medications in dialysis so that can explain. Currently receiving hemodialysis and pressure came under control.  4.  Anemia of chronic disease, so we will have to continue monitoring. 5.  Gout. Continue colchicine. 6.  History of hypertension, essential hypertension, with accelerated. Will continue Ramipril and hydralazine and we will give hydralazine IV as needed basis.   TOTAL TIME SPENT ON THIS ADMISSION: 50 minutes.  ____________________________ Ceasar Lund Anselm Jungling, MD vgv:sb D: 01/16/2014 15:33:35 ET T: 01/16/2014 16:42:33 ET JOB#: [id number]  cc: Ceasar Lund. Anselm Jungling, MD,  <Dictator> Mamie Levers, MD Vaughan Basta MD ELECTRONICALLY SIGNED 02/08/2014 12:46

## 2014-08-30 NOTE — Op Note (Signed)
PATIENT NAME:  Johnny [name], Johnny [name] MR#:  [id number] DATE OF BIRTH:  [birth year]  DATE OF PROCEDURE:  01/17/2014  PREOPERATIVE DIAGNOSES:  1.  Thrombosed left brachiocephalic fistula.  2.  Complication of vascular stent.  3.  End-stage renal disease requiring hemodialysis.   POSTOPERATIVE DIAGNOSES:  1.  Thrombosed left brachiocephalic fistula.  2.  Complication of vascular stent.  3.  End-stage renal disease requiring hemodialysis.   PROCEDURES PERFORMED:  1.  Contrast injection of left brachiocephalic fistula.  2.  Mechanical thrombectomy of arteriovenous fistula.  3.  Percutaneous transluminal angioplasty and stent placement of the arterial portion, left brachiocephalic fistula.  4.  Percutaneous transluminal angioplasty of the subclavian cephalic vein at the confluence.  5.  Percutaneous transluminal angioplasty of the venous portion at the proximal of the stent, venous access zone.   SURGEON: Katha Cabal, MD  SEDATION: Versed plus fentanyl. Continuous ECG, pulse oximetry, and cardiopulmonary monitoring was performed throughout the entire procedure by the interventional radiology nurse. Total sedation time is 1 hour 40 minutes.   ACCESS:  1.  A 7-French sheath, retrograde direction, left arm brachiocephalic fistula.  2.  A 6-French sheath, antegrade direction, left arm brachiocephalic fistula.   CONTRAST USED: Isovue 65 mL.   FLUOROSCOPY TIME: 21.3 minutes.   INDICATIONS: [name] is a 64 year old gentleman who has undergone several interventions on his fistula in the last month. He presented to dialysis and was found to have thrombosis and referred in for treatment. Risks and benefits are reviewed. All questions answered. The patient agrees to proceed.   DESCRIPTION OF PROCEDURE: The patient was taken to special procedures and placed in the supine position. After adequate sedation was achieved, the left arm was extended palm upward and prepped and draped in a sterile  fashion. Appropriate timeout was called.   Lidocaine 1% was infiltrated in the soft tissues overlying the pulsatile area of the fistula near the arterial anastomosis; however, nothing else could be felt in the fistula more proximally to this level. A microneedle was inserted, microwire, followed by a microsheath, J-wire followed by a 6-French sheath. Hand injection of contrast demonstrated thrombus within the fistula. Beyond the stents, excluding the aneurysm, the cephalic vein was patent. The Viabahn stents at the cephalic subclavian confluence were also patent, but there appeared to be a narrowing or filling defect at this level as well. Central veins were patent.   Heparin 4000 units was given. Ultrasound was placed in the sleeve and the vein was identified more proximally in the arm. Lidocaine 1% was infiltrated and micropuncture needle was inserted, microwire, followed by microsheath, J-wire, followed by a 7-French sheath. A floppy Glidewire and Kumpe catheter then negotiated down into the brachial artery and imaging was obtained demonstrating the arterial anastomosis was patent. There was a small cul-de-sac but the remaining portion of the vein between the arterial anastomosis and the stent was thrombosed. There also appeared to be an area within the stent at its very leading-most edge which was crushed as well.   A Trerotola device was then used to perform mechanical thrombectomy throughout the arterial portion of the fistula as well as the previously stented segments. Once the thrombus had been removed a V-18 wire was inserted and negotiated into the arterial system. An 8 x 50 Viabahn stent was then positioned from the arterial anastomosis more proximally bridging the leading edge of the previously placed stent and extending into the existing stent by approximately 1 cm was deployed and then postdilated 1st 8  mm with a Dorado balloon and subsequently the area of deformity previously noted was  postdilated to 9 mm with a Dorado balloon. The previously placed antegrade sheath was removed at the time of deployment.   Contrast injections now were once again performed demonstrating the arterial portion has now been well treated. There was no residual thrombus within the stented segment; however, there appeared to be residual narrowing at the proximal end of the stent. There was also the aforementioned narrowing at the subclavian cephalic confluence.   Once again, a microneedle was inserted into the stented segment in a new location in an antegrade direction, microwire, followed by microsheath, J-wire, followed by a 6-French sheath and an 8 Dorado balloon was used to angioplasty the outflow portion in the venous access zone and then the 9 balloon was used to angioplasty the subclavian cephalic confluence extending into the subclavian. Followup imaging now demonstrates rapid flow of contrast throughout the entire system. No evidence of residual stenosis or residual thrombus.   Sheaths were then removed after 4-0 pursestring sutures of Monocryl were placed. There were no immediate complications.   INTERPRETATION: Initial views of the AV fistula demonstrate there is thrombus extending from the arterial anastomosis through the stented segment. There appears to be deformity of the stent at its leading edge in the arterial portion. There is also identified a narrowing in the venous outflow as well as at the subclavian cephalic confluence.   Following mechanical thrombectomy and subsequent stent placement in the arterial portion with dilation to a maximum of 9, dilatation of the venous portion to 8, and dilatation of the subclavian cephalic confluence to 9, there is now wide patency with an excellent flow pattern. Continuous thrill is noted by palpation.   SUMMARY: Successful salvage of left arm brachiocephalic fistula with the addition of yet another stent, as well as angioplasty in the venous portions in  2 locations.   ____________________________ Katha Cabal, MD ggs:ts D: 01/19/2014 16:19:59 ET T: 01/19/2014 17:45:17 ET JOB#: [id number]  cc: Katha Cabal, MD, <Dictator> Katha Cabal MD ELECTRONICALLY SIGNED 02/24/2014 20:41

## 2014-08-30 NOTE — Discharge Summary (Signed)
PATIENT NAME:  Johnny [name], Johnny [name] MR#:  [id number] DATE OF BIRTH:  [birth year]  DATE OF ADMISSION:  01/16/2014 DATE OF DISCHARGE:  01/18/2014   ADMITTING PHYSICIAN: Ceasar Lund. Anselm Jungling, MD  DISCHARGING PHYSICIAN: Gladstone Lighter, MD  [place]:  1. Nephrology consultation by Dr. Juleen China. 2. Vascular consultation by Dr. Delana Meyer.  PRIMARY NEPHROLOGIST: Tama High, MD  PRIMARY CARDIOLOGIST: Dwayne D. Callwood, MD  DISCHARGE DIAGNOSES:  1. Hyperkalemia. 2. Clotting of his arteriovenous fistula.  3. Congestive heart failure, ejection fraction of [id number], systolic congestive heart failure.  4. Nonischemic cardiomyopathy with a normal cardiac catheterization in February 2011.  5. Hypertension.  6. End-stage renal disease, on Monday, Wednesday, Friday hemodialysis.  7. Gout.  8. Anemia of chronic disease.  9. Secondary hyperparathyroidism.   DISCHARGE MEDICATIONS: 1. Allopurinol 100 mg p.o. daily.  2. Aspirin 81 mg p.o. daily.  3. Renvela 800 mg tablet 3 tablets 3 times a day with meals.  4. Colcrys 0.6 mg p.o. daily.  5. PhosLo 1 capsule p.o. twice a day. 6. Dialyvite vitamin 1 tablet p.o. daily.  7. Klonopin 0.5 mg daily p.r.n. at bedtime for sleeping.  8. Ramipril 2.5 mg p.o. daily.  9. Hydralazine 50 mg p.o. daily.  10. Sensipar 60 mg p.o. daily.   DISCHARGE DIET: Renal diet.   DISCHARGE ACTIVITY: As tolerated.    FOLLOWUP INSTRUCTIONS:  1. Follow up for dialysis on Monday as per schedule.  2. PCP followup in 1 to 2 weeks. The patient's primary care physician is Dr. Frederich Cha.  LABORATORIES AND IMAGING STUDIES PRIOR TO DISCHARGE:  WBC 12.2, hemoglobin 8.8, hematocrit 27.9, platelet count 212.  Sodium 139, potassium 4.8, chloride 102, bicarbonate 27, BUN 62, creatinine 12.3, glucose of 101 and calcium of 8.2. Potassium on admission was 7.3.   BRIEF HOSPITAL COURSE: Johnny [name] is a 64 year old African-American male with past medical history significant for end-stage renal disease, on hemodialysis, nonischemic cardiomyopathy, systolic CHF, EF of [id number], hypertension and gout, who presented to the hospital secondary to poor functioning of dialysis access. The patient had a clotted AV fistula and needed to be declotted and had his procedure scheduled 01/16/2014; however, routine labs on that day revealed that his potassium was 7.3, so he was called to hospital for urgent hemodialysis.   1. Hyperkalemia secondary to missing dialysis from clotted AV fistula. Was admitted, had a temporary dialysis catheter placed. He was dialyzed, and his potassium has normalized. While he was here, he also had declotting of his AV fistula procedure done by Dr. Delana Meyer and had  dialysis after that using that AV fistula with minimal issues. The patient is asymptomatic. He was monitored on telemetry and is being discharged home.  2. Hypertension. His home medications of low-dose ramipril and hydralazine are being continued. 3. End-stage renal disease, on hemodialysis. As mentioned above, was dialyzed here. Last day of dialysis was 01/17/2014, and next dialysis is 01/20/2014 as outpatient. He was on supplements here. 4. His course has been otherwise uneventful in the hospital.   DISCHARGE CONDITION: Stable.   DISCHARGE DISPOSITION: Home.   TIME SPENT ON DISCHARGE: 40 minutes.   ____________________________ Gladstone Lighter, MD rk:lb D: 01/18/2014 11:18:24 ET T: 01/18/2014 11:41:28 ET JOB#: [id number]  cc: Gladstone Lighter, MD, <Dictator> [name], MD [place] Lilian Kapur, MD Katha Cabal, MD Dwayne D. Clayborn Bigness, MD Gladstone Lighter MD ELECTRONICALLY SIGNED 01/19/2014 11:09

## 2014-08-31 NOTE — Consult Note (Signed)
PATIENT NAME:  Johnny [name], Johnny [name] MR#:  [id number] DATE OF BIRTH:  28-Sep-1950  DATE OF CONSULTATION:  06/21/2011  REFERRING PHYSICIAN:  Cherre Huger, MD CONSULTING PHYSICIAN:  Heinz Knuckles. Theda Payer, MD  REASON FOR CONSULTATION: Possible septic arthritis.   HISTORY OF PRESENT ILLNESS: The patient is a 64 year old black man with a past history significant for hypertension, end-stage renal disease on hemodialysis, and gout who was admitted today with approximately one-week history of increasing swelling and pain in the left hand and wrist. The patient initially noticed some mild discomfort in his hand with moving his fingers. Over the course of the week, the pain increased and is mainly in the wrist. He describes the pain as being more in the dorsum than the palmar aspect of the wrist and hand. He has had some swelling as well. He has not noted significant erythema. He states that this sensation has been throbbing and achy in nature. It is getting worse over the course of the week. He feels that it is similar to his prior gout episodes but these have never occurred in his hand and have only occurred in his knees, ankles, and feet. He has had no trouble with dialysis, specifically he has had no episodes of hypotension recently. He has had no problems with his graft, although he says occasionally the tech has trouble cannulating the vessel. When the swelling was noticed in dialysis, he was given a dose of vancomycin last week, but it is unclear which day he actually received this. He has had a MRI which shows evidence for inflammation within the hand and wrist area. He has not had fevers, chills, or sweats. He denies any dizziness or confusion.   ALLERGIES: Shellfish.   PAST MEDICAL HISTORY:  1. Gout. 2. Hypertension.  3. End-stage renal disease, on hemodialysis.  4. Secondary hyperparathyroidism.  5. Clostridium difficile colitis.   SOCIAL HISTORY: The patient does not smoke. He does not drink. No  injecting drug use.   FAMILY HISTORY: Positive for hypertension and coronary artery disease.  REVIEW OF SYSTEMS: GENERAL: No fevers, chills, sweats, or malaise. HEENT: No headaches. No sinus congestion. No sore throat. NECK: No stiffness. No swollen glands. RESPIRATORY: No cough or shortness of breath. No sputum production. CARDIAC: No chest pain or palpitations. No peripheral edema, other than in the left hand. GI: No nausea, no vomiting, no abdominal pain, and no change in his bowels. GENITOURINARY: No new complaints. MUSCULOSKELETAL: He has had pain and swelling in the left hand. Pain is worse with moving his fingers or moving his wrist. He states the pain is mainly over the dorsum of the left wrist. No other joints have been bothering him. He has not had any recent gouty flares. SKIN: No rashes. NEUROLOGIC: No focal weakness. PSYCHIATRIC: No complaints. All other systems are negative.   PHYSICAL EXAMINATION:   VITAL SIGNS: T-max 98.6, pulse 91, blood pressure 164/86, and saturation 100% on room air.   GENERAL: A 64 year old black man in no acute distress.   HEENT: Normocephalic, atraumatic. Pupils equal and reactive to light. Extraocular motion intact. Sclerae, conjunctivae, and lids are without evidence for emboli or petechiae. Oropharynx shows no erythema or exudate. Teeth and gums are in fair condition.   NECK: Supple. Full range of motion. Midline trachea. No lymphadenopathy. No thyromegaly.   LUNGS: Clear to auscultation bilaterally with good air movement. No focal consolidation.   HEART: Regular rate and rhythm without murmur, rub, or gallop.   ABDOMEN: Soft, nontender, and nondistended. No  hepatosplenomegaly. No hernia is noted.   EXTREMITIES: He has a graft in place in the left upper extremity. There was an well-appreciated thrill. There was no erythema or drainage from the graft site. More distally in the left hand there was swelling compared to the right hand, all five fingers as  well and the dorsum of the hand and into the wrist. The area was warm to touch compared to the right and there was tenderness with palpation as well as with any movement of the left hand. All other joints were within normal limits.   SKIN: There is no significant rash. The area over the left wrist was not particularly red. He had no stigmata of endocarditis, specifically no Janeway lesions or Osler nodes.   NEUROLOGIC: The patient was awake and interactive, moving all four extremities.   PSYCHIATRIC: Mood and affect appeared normal.  LABS/STUDIES: BUN 25, creatinine 7.94, bicarbonate 23, and anion gap 14. LFTs were unremarkable. White count was 7.3 with a hemoglobin 8.6, platelet count 238, and ANC of 5.0. He had a white count drawn on 06/11/2011, which was 10.1, with an ANC of 8.5.   Blood cultures were obtained today and are pending.   An ultrasound obtained on 06/11/2011 showed the left cephalic vein had a nonocclusive thrombus. The dialysis graft was patent.  A MRI performed on 06/19/2011 showed moderate joint effusions involving the distal radial and ulnar joint, radiocarpal joint and mid carpal joint. There was mild marrow edema throughout the carpal bones which was concerning for septic arthritis. There was fluid present in the extensor tendon sheaths consistent with tenosynovitis. There was also a longitudinal split pair of the extensor carpi ulnaris and soft tissue edema along the dorsal aspect of the wrist within the subcutaneous fat.   IMPRESSION: A 64 year old black man with a history of hypertension, end-stage renal disease on hemodialysis and gout who was admitted with an inflammatory process of the left hand.   RECOMMENDATIONS:  1. This could be infection versus an inflammatory arthropathy. He has had gout in the past, but only in his lower extremities. The MRI is concerning for possible septic joint. I would obtain an aspirate to look for gout crystals and get cultures.  2. I  agree with using vancomycin for now as staph and strep species are most likely organisms. He has received a dose already and will not need further dosing for several days. The fact that he has been on antibiotics for several days prior to either an aspiration or the blood cultures may make these culture less likely to be positive.  3. If this is infectious, he will need recurrent aspirations or an open irrigation. Orthopedics is currently seeing him.  4. We will await the results of his aspiration.       This is a moderately complex infectious disease consult. Thank you very much for involving me in Mr. Singleton care.  ____________________________ Heinz Knuckles. Rozell Kettlewell, MD meb:slb D: 06/21/2011 14:23:43 ET T: 06/21/2011 14:37:08 ET JOB#: [id number]  cc: Heinz Knuckles. Aubriana Ravelo, MD, <Dictator> Britteney Ayotte E Emilyn Ruble MD ELECTRONICALLY SIGNED 06/22/2011 11:43

## 2014-08-31 NOTE — Consult Note (Signed)
PATIENT NAME:  Johnny [name], Johnny [name] MR#:  [id number] DATE OF BIRTH:  [birth year]  DATE OF CONSULTATION:  06/21/2011  REFERRING PHYSICIAN:  Dr. Fritzi Mandes  CONSULTING PHYSICIAN:  Laurice Record. Holley Bouche., MD  CHIEF COMPLAINT: Left hand and wrist pain and swelling.   HISTORY OF PRESENT ILLNESS: The patient is a 64 year old male who noted the onset of some stiffness and discomfort to the fingers of the left hand last Monday following his hemodialysis. The next several days he had the increase in swelling to the dorsum of the hand and wrist with increased pain. He reported pain with attempting to make a fist along with flexion or extension of the wrist. He denies any fevers, chills, or sweats. He denies any gross erythema to the site although has noted some slight increased warmth. He apparently received a dose of vancomycin at the dialysis center, although he is uncertain of the exact date. Pain and swelling persisted despite the dose of vancomycin and Dr. Holley Raring, his nephrologist, ordered an MRI of the left wrist and hand which demonstrated edema and some reactive changes to the carpus. He was admitted last night through the Emergency Room due to the persistent swelling and pain.   PAST MEDICAL HISTORY:  1. Syncope.  2. C. difficile. 3. Cardiomyopathy (ejection fraction of 35%). 4. Nonobstructive coronary artery disease. 5. Hypertension. 6. End-stage renal disease on dialysis. 7. Gout. 8. Anemia of chronic disease.  9. Secondary hyperparathyroidism.  10. Status post left upper extremity fistula.    ALLERGIES: Shellfish.   ADMISSION MEDICATIONS:  1. Allopurinol 100 mg daily.  2. Aspirin 81 mg daily.  3. Cinacalcet 30 mg daily.  4. Nephro-Vite daily.  5. Ramipril 2.5 mg daily.  6. Renvela 800 mg 3 tablets 3 times a day.  7. Percocet 1 to 2 tablets every six hours as needed for pain.   FAMILY HISTORY: Positive for coronary artery disease and hypertension in father and hypertension in mother.    SOCIAL HISTORY: Patient denies any current tobacco use, alcohol use, or use of illicit drugs. He is on disability. He was previously a Presenter, broadcasting.   REVIEW OF SYSTEMS: CONSTITUTIONAL: No fevers, chills, weakness, fatigue, or night sweats. HEENT: No blurred vision, double vision, tinnitus, ear pain, hearing loss. RESPIRATORY: No cough, wheeze, hemoptysis, or dyspnea. CARDIOVASCULAR: No chest pain, palpitations, dyspnea on exertion. GASTROINTESTINAL: No nausea, vomiting, diarrhea, melena, bright red blood per rectum, or abdominal pain. GENITOURINARY: No dysuria or hematuria. MUSCULOSKELETAL: Positive for pain to the left wrist joint and hand. Patient does have a history of gout but denies any recent flares. NEUROLOGIC: No CVA, transient ischemic attacks, numbness, weakness.   PHYSICAL EXAMINATION:  GENERAL: Patient is a pleasant, well-developed, well-nourished male seen in no acute distress.   HEENT: Atraumatic, normocephalic. Pupils are equal and reactive. Sclerae are clear. Extraocular motion is intact. Oropharynx is clear.  NECK: Supple, nontender, with good range of motion.   LUNGS: Clear to auscultation bilaterally.   CARDIAC: Regular rate and rhythm with normal S1, S2. No appreciable murmurs.   MUSCULOSKELETAL: Examination is notable for findings of the left upper extremity. There is soft tissue swelling to the dorsum of the left hand and wrist with tenderness to palpation along the carpus and radial ulnar joint. Pronation and supination is well tolerated. Pain is elicited with attempts at flexion or extension of the wrist. Some swelling is noted to the digits. No erythema, although slight increased warmth is noted. Kanavel  signs are negative. Good capillary refill  is appreciated.   NEUROLOGIC: Awake, alert, and oriented. Sensory function is grossly intact. Motor strength is grossly intact. No clonus or tremor.   LABORATORY, DIAGNOSTIC AND RADIOLOGICAL DATA: CBC obtained last night  demonstrated a white count 7.3 with a normal differential. Uric acid was 2.8.   MRI of the left wrist from [facility] dated 06/19/2011 was reviewed. Generalized soft tissue edema is noted within the subcutaneous soft tissues especially on the dorsum of the hand and wrist. Some mild marrow signal changes noted within the carpus felt to be secondary to reactive changes. Moderate joint effusion was noted to the radial carpal joint as well as to the carpal joints. Flexor tendons appear to be intact.   IMPRESSION: Left wrist and hand pain and swelling. Inflammatory versus infectious etiology.   PLAN: Findings were discussed in detail with the patient. He has been afebrile and lab studies are relatively benign with regards to his CBC. Blood cultures are pending at this time, although given the fact that he has already received vancomycin it is doubtful that the cultures will be of significant benefit.   Given the findings on the MRI, I had suggested attempt at aspiration of the left wrist. Risks and benefits were discussed with the patient. He expressed his understanding of the risks and benefits and agree with attempted aspiration. The dorsum of the left wrist was cleaned and prepped with Betadine. Skin was anesthetized with ethyl chloride. An 18-gauge needle was introduced dorsally and there was palpable penetration of the capsule. Attempted aspiration failed to deliver any substantial fluid. Needle was withdrawn and dressing was applied. Good hemostasis was noted.   It may be worthwhile to see if radiology may be able to attempt aspiration under ultrasound guidance. If so, a specimen should be submitted for cell count, culture, and evaluation for crystals. I have suggested continuation of ice and elevation.   ____________________________ Laurice Record. Holley Bouche., MD jph:cms D: 06/21/2011 22:54:43 ET T: 06/22/2011 09:22:29 ET JOB#: [id number]  cc: Laurice Record. Holley Bouche., MD,  <Dictator> Laurice Record Holley Bouche MD ELECTRONICALLY SIGNED 06/22/2011 19:01

## 2014-08-31 NOTE — Consult Note (Signed)
Impression: 64yo BM w/ h/o HTN, ESRD on HD, gout admitted with inflammatory process of the left hand.  This could infection vs an inflammatory arthropathy.  He has had gout in the past, but only in his lower extremities.  Would obtain an aspirate to look for gout crystals and get cultures. Agree with vancomycin for now as Staph and Strep spp are the most likely organisms.  He has received a dose already and will not need further dosing for several days. If this is infectious, he will either need recurrent aspirations or open irrigation.  Ortho is seeing him. Will await the results of his aspiration.  Electronic Signatures: Amilyah Nack, Heinz Knuckles (MD)  (Signed on 12-Feb-13 12:53)  Authored  Last Updated: 12-Feb-13 12:53 by Desmon Hitchner, Heinz Knuckles (MD)

## 2014-08-31 NOTE — Discharge Summary (Signed)
PATIENT NAME:  Johnny [name], Johnny [name] MR#:  [id number] DATE OF BIRTH:  April 02, 1951  DATE OF ADMISSION:  06/21/2011 DATE OF DISCHARGE:  06/24/2011  DIAGNOSES: 1. Left hand swelling and pain, possibly due to underlying inflammatory arthritis. 2. End-stage renal disease. 3. Accelerated hypertension. 4. Coronary artery disease with history of cardiomyopathy, ejection fraction 25% to 50%. 5. Gout. 6. Anemia of chronic disease. 7. Secondary hyperparathyroidism.   DISPOSITION: The patient is being discharged home. Follow up with primary care physician, Dr. Ellin Goodie, in 1 to 2 weeks after discharge. Follow up with nephrologist, Dr. Anthonette Legato in 1 to 2 weeks after discharge.   DIET: Low sodium, low phosphorus, no bananas, oranges, or orange juice.   ACTIVITY: As tolerated.   DISCHARGE MEDICATIONS:  1. Prednisone taper as prescribed.  2. The patient has been advised to restart allopurinol after completing steroid taper. Allopurinol 100 mg daily.  3. Aspirin 81 mg daily.  4. Sensipar 30 mg daily.  5. Nephro-Vite 1 tablet daily.  6. Renvela 800 mg 3 tablets t.i.d.  7. Ramipril 2.5 mg daily.   RESULTS: Blood cultures have been negative so far.  PTH 129, elevated.  Hepatitis B surface antigen negative. MRI of the left hand showed moderate joint effusion involving the distal radial ulnar joint, radiocarpal joint, and metacarpal joint, mild marrow edema throughout the carpal bone which could be reactive secondary to infectious and inflammatory etiology versus underlying degenerative joint disease. Hemoglobin 8.6 to 9.1. Normal white count, normal hemoglobin.  Creatinine 7.94.  Rest of labs were normal.   CONSULTATIONS:  Nephrology consultation with Dr Holley Raring.  ID consultation with Dr Clayborn Bigness.  Orthopedic consultation with Dr Marry Guan.    HOSPITAL COURSE: The patient is a 64 year old male with past medical history of anemia of chronic disease, nonobstructive coronary artery disease, cardiomyopathy with  ejection fraction of 25% to 30%, end-stage renal disease who presented with left hand pain and swelling. He was receiving vancomycin with dialysis as an outpatient by his primary nephrologist without any significant improvement in the symptoms.   An MRI showed inflammation and tenosynovitis.  Patient was continued on IV vancomycin with dialysis.  Orthopedic consultation with Dr. Marry Guan was obtained. Dr. Marry Guan tried to do a bedside joint aspiration; however, no fluid could be aspirated.  As per Dr. Marry Guan there was no evidence of pus and not enough fluid could be reaspirated even on the radiology without the risk of introducing infection. He felt this was more likely inflammatory arthritis rather than infectious.  Patient was also evaluated by Dr. Clayborn Bigness since patient was afebrile with normal white count, and her blood cultures were negative. In view of poor response to IV antibiotics, Dr. Clayborn Bigness also felt that the patient had inflammatory arthritis. After consultation with Dr Holley Raring, the patient was started on prednisone with significant improvement in the patient's presenting symptoms. The patient is being discharged home on tapering doses of steroids. He has been advised to restart allopurinol after completing the steroid taper. The patient had accelerated hypertension on presentation, which was possibly due to pain.  His blood pressure has been well controlled. He is being discharged home in a stable condition.   TIME SPENT: 45 minutes.      ____________________________ Cherre Huger, MD sp:vtd D: 06/24/2011 15:57:03 ET T: 06/25/2011 14:34:18 ET JOB#: [id number]  cc: Cherre Huger, MD, <Dictator> [name], MD Cherre Huger MD ELECTRONICALLY SIGNED 06/28/2011 11:26

## 2014-08-31 NOTE — H&P (Signed)
PATIENT NAME:  TRASE, Johnny [name] MR#:  [id number] DATE OF BIRTH:  1950-09-13  DATE OF ADMISSION:  06/21/2011  PRIMARY CARE PHYSICIAN: Kingsley Spittle, MD  NEPHROLOGIST: Anthonette Legato, MD / Murlean Iba, MD  CHIEF COMPLAINT: Left arm/wrist swelling and pain for several days.   HISTORY OF PRESENT ILLNESS: [name] is a 64 year old African American gentleman with history of end-stage renal disease on hemodialysis Monday, Wednesday, and Friday for the last 1-1/2 years who presents to the Emergency Room for direct admission, as suggested by Dr. Holley Raring. The patient has been having swelling of his left forearm, mainly at the wrist area, in his left hand, which he has not been able to move and is having significant pain in the joint. He denies any fever, nausea, or vomiting. He denies any shortness of breath. He denies any injury to the joint. The patient was started on vancomycin at the dialysis center. The pain and swelling was not improving, hence he was asked to come to the Emergency Room. Dr. Marry Guan from orthopedics has been notified, along with Dr. Holley Raring who knows the patient is being admitted to the hospital. The patient received IV vancomycin on arrival to the Emergency Room. His blood cultures were drawn.   PAST MEDICAL HISTORY:  1. History of syncope.  2. History of C. difficile.  3. Cardiomyopathy with ejection fraction of 35%.  4. Nonobstructive coronary artery disease, cardiac catheterization in February 2011 showed ejection fraction of 25 to 35%. Circumflex has 25% stenosis. Mid RCA has 25% stenosis.  5. History of hypertension.  6. End-stage renal disease on dialysis Monday, Wednesday, and Friday at the [facility].  7. Gout. 8. History of anemia of chronic disease.  9. Secondary hyperparathyroidism.   PAST SURGICAL HISTORY: Left upper extremity fistula creation.  ALLERGIES:  Shellfish.   MEDICATIONS:  1. Allopurinol 100 mg daily.  2. Aspirin 81 mg daily.   3. Cinacalcet 30 mg daily.  4. Nephro-Vite p.o. daily.  5. Ramipril 2.5 mg daily.  6. Renvela 800 mg 3 tablets three times daily. 7. Percocet 5/325 mg 1 to 2 every 6 hours p.r.n.   SOCIAL HISTORY: He denies drinking, smoking, or use of illicit drugs. He is on disability. He used to be a Presenter, broadcasting.  FAMILY HISTORY: Positive for hypertension and heart disease in father. Mother has high blood pressure. Father died of unknown reason.   REVIEW OF SYSTEMS: CONSTITUTIONAL: No fever, fatigue, or weakness. Positive for pain in the forearm.  EYES: No blurred or double vision. ENT: No tinnitus, ear pain, or hearing loss. RESPIRATORY: No cough, wheeze, hemoptysis, or dyspnea. CARDIOVASCULAR: No chest pain, orthopnea, or edema. GASTROINTESTINAL: No nausea, vomiting, diarrhea, or abdominal pain. GU: No dysuria or hematuria. ENDOCRINE: No polyuria or nocturia. HEMATOLOGY: Positive for chronic anemia. SKIN: No acne or rash. MUSCULOSKELETAL: Positive for pain in the left wrist joint and left hand. NEUROLOGIC: No cerebrovascular accident or transient ischemic attack. PSYCH: No anxiety or depression. All other systems are reviewed and negative.   PHYSICAL EXAMINATION:   GENERAL: The patient is awake, alert, and oriented x3. He is in mild to moderate distress due to left arm pain.   VITALS: He is afebrile, pulse 78, blood pressure 182/88, and saturations are 98% on room air.   HEENT: Atraumatic, normocephalic. Pupils equal, round, and reactive to light and accommodation. Extraocular movements intact. Oral mucosa is moist.   NECK: Supple. No JVD. No carotid bruit.   RESPIRATORY: Clear to auscultation bilaterally. No rales, rhonchi,  respiratory distress, or labored breathing.   HEART: Both heart sounds are normal. Rate and rhythm is regular. PMI is not lateralized. Chest is nontender.   EXTREMITIES: The patient does have significant swelling at the left wrist along with swelling in the left hand with  decreased range of motion of the left wrist which appears to be painful. Overlying cellulitis cannot be appreciated due to the patient's dark skin color. Appears to be mildly warm to touch.   LOWER EXTREMITIES: Good pedal pulses. Good femoral pulses. No lower extremity edema.   ABDOMEN: Soft, benign, and nontender. No organomegaly.   NEUROLOGIC: Cranial nerves II through XII grossly intact. No motor or sensory deficits.   PSYCH: The patient is awake, alert, and oriented x3.   LABS/STUDIES: Cardiac enzymes, first set negative. White count is 7.3, hemoglobin and hematocrit is 8.6 and 26.1, and platelet count 238. Glucose 109, BUN 25, creatinine 7.94, sodium 138, potassium 3.6, chloride 101, bicarbonate 23, and calcium 9.5. LFTs are within normal limits. Uric acid 2.8.   MRI of the left wrist, performed on 06/18/2012, showed moderate joint effusion involving the distal radial ulnar joint, radial carpal joint, and mid carpal joint. There is mild marrow edema throughout the carpal bones, which may be reactive secondary to infectious or inflammatory etiology versus underlying degenerative change. Given constellation of findings, overall appearance is concerning for septic arthritis. There is fluid present within the extensor tendon sheath concerning for tenosynovitis, which may be secondary to infectious or inflammatory etiology. Longitudinal split tear of extensor carpi ulnaris. There is soft tissue edema along the dorsal aspects of the wrist within the subcutaneous fat which may represent reactive edema versus cellulitis.   ASSESSMENT: 64 year old [name] with:  1. Left arm swelling and pain which appears to be suspected septic arthritis versus inflammatory joint disease. The patient has been on vancomycin since last Friday. MRI of the left wrist, as noted above. No fever and white count is normal. Blood culture is pending.  2. End-stage renal disease on hemodialysis Monday, Wednesday, and Friday.   3. Accelerated hypertension, more so precipitated by pain in the left wrist.  4. Coronary artery disease with history of cardiomyopathy, ejection fraction of 25 to 30%.   PLAN:  1. Admit the patient to the medical floor.  2. Renal diet.  3. IV fluids at low rate of 50 mL an hour.  4. We will continue vancomycin. The patient already received a dose in the Emergency Room today. We will have pharmacy dose.  5. Follow blood cultures and CBC.  6. Orthopedic consultation with Dr. Marry Guan. The ER physician has discussed the case with Dr. Marry Guan.  7. We will continue dialysis in-house. Dr. Holley Raring will be consulted.  8. Continue all home medications.  9. PRN morphine and Percocet for pain.  10. Heparin subcutaneous twice a day for deep vein thrombosis prophylaxis.   Further work-up according to the patient's clinical course. The hospital admission plan was discussed with the patient. The patient was explained there is a possibility the joint effusion will be tapped, arthrocentesis will be done by orthopedics; however, we will await their recommendations.  TIME SPENT: 50 minutes. ____________________________ Hart Rochester Posey Pronto, MD sap:slb D: 06/21/2011 00:26:51 ET T: 06/21/2011 07:33:48 ET JOB#: [id number]  cc: Srah Ake A. Posey Pronto, MD, <Dictator> [name], MD Ilda Basset MD ELECTRONICALLY SIGNED 06/21/2011 13:27

## 2014-08-31 NOTE — Consult Note (Signed)
Brief Consult Note: Diagnosis: left wrist swelling.   Patient was seen by consultant.   Comments: Patient seen & labs reviewed. No pain with pronation/supination. No erythema/cellulitis. STS to dorsum of hand & wrist. CBC normal. Blood cultures pending. MRI reviewed. Cold therapy and elevation to left wrist. Consult ID. Inflammatory vs infectious etiology.  FULL NOTE TO FOLLOW.  Electronic Signatures: Dereck Leep (MD)  (Signed 12-Feb-13 08:30)  Authored: Brief Consult Note   Last Updated: 12-Feb-13 08:30 by Dereck Leep (MD)

## 2014-08-31 NOTE — Op Note (Signed)
PATIENT NAME:  Johnny [name], Johnny [name] MR#:  [id number] DATE OF BIRTH:  Aug 25, 1950  DATE OF PROCEDURE:  07/21/2011  PREOPERATIVE DIAGNOSES:  1. End-stage renal disease.  2. Left upper extremity fistula which is poorly functioning.  3. Hypertension.   POSTOPERATIVE DIAGNOSES:  1. End-stage renal disease.  2. Left upper extremity fistula which is poorly functioning.  3. Hypertension.   PROCEDURES: 1. Ultrasound guidance for vascular access, left upper extremity brachiocephalic AV fistula.  2. Left upper extremity fistulogram.  3. Percutaneous transluminal angioplasty of cephalic vein stenosis with 7 and 8 mm diameter angioplasty balloon.   SURGEON: Algernon Huxley, MD  ANESTHESIA: Local with moderate conscious sedation.   ESTIMATED BLOOD LOSS: Minimal.   INDICATION FOR PROCEDURE: This is a 64 year old African American male with end-stage renal disease. We have followed him for some time for his dialysis access. A recent noninvasive study had showed a frozen vein valve with some stenosis and surrounding thrombus and his fistula flows have decreased. He is brought in for fistulogram with intervention. Risks and benefits are discussed. Informed consent was obtained.   DESCRIPTION OF PROCEDURE: Patient brought to the vascular interventional radiology suite. Left upper extremity was sterilely prepped and draped, sterile surgical field was created. Ultrasound was used to access the cephalic vein just beyond the brachiocephalic anastomosis which was done without difficulty. Imaging was then performed through the micropuncture sheath and when a significant stenosis was seen we upsized to a 6 French sheath and gave a small dose of intravenous heparin. There was an area between his arterial and venous access sites with a frozen vein valve and a moderate stenosis likely in the 60% range with what appeared to be some local thrombus. We crossed this lesion without difficulty with a Magic torque wire and ballooned it  with a 7 and then an 8 mm diameter angioplasty balloon with good angiographic result and less than 30% residual stenosis. At this point, I elected to terminate the procedure. The sheath was removed and 4-0 Monocryl pursestring suture was placed and pressure was held. Sterile dressing was placed. The patient tolerated procedure well and was taken to the recovery room in stable condition.   ____________________________ Algernon Huxley, MD jsd:cms D: 07/22/2011 16:22:10 ET T: 07/22/2011 16:47:54 ET JOB#: [id number]  cc: Algernon Huxley, MD, <Dictator> Algernon Huxley MD ELECTRONICALLY SIGNED 07/25/2011 17:18

## 2014-09-07 NOTE — Op Note (Signed)
PATIENT NAME:  Johnny [name], Johnny [name] MR#:  [id number] DATE OF BIRTH:  Mar 04, 1951  DATE OF PROCEDURE:  05/27/2014  PREOPERATIVE DIAGNOSES:  1. Complication of dialysis device.  2. End-stage renal disease requiring hemodialysis.   POSTOPERATIVE DIAGNOSES:  1. Complication of dialysis device.  2. End-stage renal disease requiring hemodialysis.   PROCEDURES PERFORMED:  1. Contrast injection of left arm brachiocephalic fistula.  2. Percutaneous transluminal angioplasty of the venous cannulation site, left arm brachiocephalic fistula.  3. Percutaneous transluminal angioplasty of the cephalic confluence, separate and distinct lesion.   SURGEON:  Katha Cabal, MD.    SEDATION:  Versed plus fentanyl.   CONTRAST USED: Isovue 35 mL.   FLUOROSCOPY TIME: 3.7 minutes.   INDICATIONS: Johnny [name] is a 64 year old gentleman who is having increasing problems with recirculation of bleeding post dialysis. Risks and benefits for angiography and possible intervention are reviewed. All questions answered. The patient agrees to proceed.   DESCRIPTION OF PROCEDURE: The patient is taken to special procedures and placed in the supine position. After adequate sedation is achieved, his left arm is prepped and draped in a sterile fashion and appropriate timeout is called.   Lidocaine 1% is infiltrated in the soft tissues near the arterial anastomosis in an antegrade direction.  A micropuncture needle is inserted, microwire followed by micro sheath, J-wire followed by a 6 French sheath. Hand injection of contrast is then utilized to image the fistula as well as central veins. Multiple stenoses are noted in the area of venous cannulation. There is also narrowing a greater than 80% associated with the cephalic confluence in the previously placed stent. These are separate and distinct lesions.   Initially, a 6 mm Lutonix balloon is used to dilate the venous cannulation site. Ultimately, an 8 mm Dorado balloon is used to  angioplasty this area as well with an excellent result and therefore, stenting is not performed. In a similar fashion, an 8 mm balloon is used to dilate the cephalic confluence with excellent result and therefore, no further stenting is performed.   Follow-up images demonstrate an excellent result. Pursestring suture is placed around the sheath. The sheath is removed, light pressure is held, and there are no immediate complications.   INTERPRETATION: Initial views of the fistula demonstrate problems within the venous cannulation site, several string signs noted and a second separate and distinct area at the cephalic confluence is also identified. Central veins are widely patent.  Arterial anastomosis is widely patent. Following angioplasty, there is near complete resolution of both lesions and therefore, no further stents are placed.    ____________________________ Katha Cabal, MD ggs:by D: 05/28/2014 17:42:54 ET T: 05/28/2014 20:46:53 ET JOB#: [id number]  cc: Katha Cabal, MD, <Dictator> Katha Cabal MD ELECTRONICALLY SIGNED 06/11/2014 17:42

## 2015-08-24 ENCOUNTER — Other Ambulatory Visit: Payer: Self-pay | Admitting: Vascular Surgery

## 2015-09-01 ENCOUNTER — Ambulatory Visit
Admission: RE | Admit: 2015-09-01 | Discharge: 2015-09-01 | Disposition: A | Discharge status: Home / Self Care | Payer: Medicare Other | Source: Ambulatory Visit | Attending: Vascular Surgery | Admitting: Vascular Surgery

## 2015-09-01 ENCOUNTER — Encounter: Payer: Self-pay | Admitting: *Deleted

## 2015-09-01 ENCOUNTER — Encounter
Admission: RE | Disposition: A | Discharge status: Home / Self Care | Payer: Self-pay | Source: Ambulatory Visit | Attending: Vascular Surgery

## 2015-09-01 DIAGNOSIS — Z79899 Other long term (current) drug therapy: Secondary | ICD-10-CM | POA: Insufficient documentation

## 2015-09-01 DIAGNOSIS — M109 Gout, unspecified: Secondary | ICD-10-CM | POA: Diagnosis not present

## 2015-09-01 DIAGNOSIS — N186 End stage renal disease: Secondary | ICD-10-CM | POA: Insufficient documentation

## 2015-09-01 DIAGNOSIS — I10 Essential (primary) hypertension: Secondary | ICD-10-CM | POA: Insufficient documentation

## 2015-09-01 DIAGNOSIS — I120 Hypertensive chronic kidney disease with stage 5 chronic kidney disease or end stage renal disease: Secondary | ICD-10-CM | POA: Insufficient documentation

## 2015-09-01 DIAGNOSIS — Y832 Surgical operation with anastomosis, bypass or graft as the cause of abnormal reaction of the patient, or of later complication, without mention of misadventure at the time of the procedure: Secondary | ICD-10-CM | POA: Diagnosis not present

## 2015-09-01 DIAGNOSIS — Z91013 Allergy to seafood: Secondary | ICD-10-CM | POA: Diagnosis not present

## 2015-09-01 DIAGNOSIS — Z992 Dependence on renal dialysis: Secondary | ICD-10-CM | POA: Diagnosis not present

## 2015-09-01 DIAGNOSIS — T82898A Other specified complication of vascular prosthetic devices, implants and grafts, initial encounter: Secondary | ICD-10-CM | POA: Diagnosis present

## 2015-09-01 HISTORY — PX: PERIPHERAL VASCULAR CATHETERIZATION: SHX172C

## 2015-09-01 HISTORY — DX: Chronic kidney disease, unspecified: N18.9

## 2015-09-01 HISTORY — DX: Essential (primary) hypertension: I10

## 2015-09-01 HISTORY — DX: Gout, unspecified: M10.9

## 2015-09-01 LAB — POTASSIUM (ARMC VASCULAR LAB ONLY): POTASSIUM (ARMC VASCULAR LAB): 5 (ref 3.5–5.1)

## 2015-09-01 SURGERY — A/V SHUNTOGRAM/FISTULAGRAM
Anesthesia: Moderate Sedation | Laterality: Left

## 2015-09-01 MED ORDER — HEPARIN (PORCINE) IN NACL 2-0.9 UNIT/ML-% IJ SOLN
INTRAMUSCULAR | Status: AC
  Filled 2015-09-01: qty 1000

## 2015-09-01 MED ORDER — METHYLPREDNISOLONE SODIUM SUCC 125 MG IJ SOLR
125.0000 mg | INTRAMUSCULAR | Status: DC | PRN
  Administered 2015-09-01: 125 mg via INTRAVENOUS

## 2015-09-01 MED ORDER — LIDOCAINE HCL (PF) 1 % IJ SOLN
INTRAMUSCULAR | Status: AC
  Filled 2015-09-01: qty 30

## 2015-09-01 MED ORDER — METHYLPREDNISOLONE SODIUM SUCC 125 MG IJ SOLR
INTRAMUSCULAR | Status: AC
  Filled 2015-09-01: qty 2

## 2015-09-01 MED ORDER — IOPAMIDOL (ISOVUE-300) INJECTION 61%
INTRAVENOUS | Status: DC | PRN
  Administered 2015-09-01: 15 mL via INTRA_ARTERIAL

## 2015-09-01 MED ORDER — HEPARIN SODIUM (PORCINE) 1000 UNIT/ML IJ SOLN
INTRAMUSCULAR | Status: AC
  Filled 2015-09-01: qty 1

## 2015-09-01 MED ORDER — FENTANYL CITRATE (PF) 100 MCG/2ML IJ SOLN
INTRAMUSCULAR | Status: AC
  Filled 2015-09-01: qty 2

## 2015-09-01 MED ORDER — ONDANSETRON HCL 4 MG/2ML IJ SOLN: 4.0000 mg | Freq: Four times a day (QID) | INTRAMUSCULAR | Status: DC | PRN

## 2015-09-01 MED ORDER — HYDROMORPHONE HCL 1 MG/ML IJ SOLN: 1.0000 mg | Freq: Once | INTRAMUSCULAR | Status: DC

## 2015-09-01 MED ORDER — FAMOTIDINE 20 MG PO TABS: 20.0000 mg | ORAL_TABLET | Freq: Once | ORAL | Status: DC

## 2015-09-01 MED ORDER — MIDAZOLAM HCL 2 MG/2ML IJ SOLN
INTRAMUSCULAR | Status: DC | PRN
  Administered 2015-09-01: 2 mg via INTRAVENOUS
  Administered 2015-09-01: 1 mg via INTRAVENOUS

## 2015-09-01 MED ORDER — METHYLPREDNISOLONE SODIUM SUCC 125 MG IJ SOLR: 125.0000 mg | Freq: Once | INTRAMUSCULAR | Status: DC

## 2015-09-01 MED ORDER — FAMOTIDINE 20 MG PO TABS
40.0000 mg | ORAL_TABLET | ORAL | Status: DC | PRN
  Administered 2015-09-01: 40 mg via ORAL

## 2015-09-01 MED ORDER — SODIUM CHLORIDE 0.9 % IV SOLN
INTRAVENOUS | Status: DC
  Administered 2015-09-01: 10:00:00 via INTRAVENOUS

## 2015-09-01 MED ORDER — FENTANYL CITRATE (PF) 100 MCG/2ML IJ SOLN
INTRAMUSCULAR | Status: DC | PRN
  Administered 2015-09-01 (×2): 50 ug via INTRAVENOUS

## 2015-09-01 MED ORDER — FAMOTIDINE 20 MG PO TABS
ORAL_TABLET | ORAL | Status: AC
  Filled 2015-09-01: qty 1

## 2015-09-01 MED ORDER — MIDAZOLAM HCL 5 MG/5ML IJ SOLN
INTRAMUSCULAR | Status: AC
  Filled 2015-09-01: qty 5

## 2015-09-01 MED ORDER — DEXTROSE 5 % IV SOLN
1.5000 g | INTRAVENOUS | Status: AC
  Administered 2015-09-01: 1.5 g via INTRAVENOUS

## 2015-09-01 SURGICAL SUPPLY — 5 items
DRAPE BRACHIAL (DRAPES) ×2 IMPLANT
PACK ANGIOGRAPHY (CUSTOM PROCEDURE TRAY) ×2 IMPLANT
SET INTRO CAPELLA COAXIAL (SET/KITS/TRAYS/PACK) ×2 IMPLANT
SHEATH BRITE TIP 6FRX5.5 (SHEATH) ×2 IMPLANT
TOWEL OR 17X26 4PK STRL BLUE (TOWEL DISPOSABLE) ×2 IMPLANT

## 2015-09-01 NOTE — Op Note (Signed)
OPERATIVE NOTE   PROCEDURE: 1. Contrast injection left brachiocephalic fistula  PRE-OPERATIVE DIAGNOSIS: Complication of dialysis access                                                       End Stage Renal Disease  POST-OPERATIVE DIAGNOSIS: same as above   SURGEON: Katha Cabal, M.D.  ANESTHESIA: Conscious sedation was administered under my direct supervision. IV Versed plus fentanyl were utilized. Continuous ECG, pulse oximetry and blood pressure was monitored throughout the entire procedure. A total of 3 milligrams of Versed and 100 micrograms of fentanyl were utilized.  Conscious sedation was for a total of 35 minutes.  ESTIMATED BLOOD LOSS: minimal  FINDING(S): 1. Occlusion of the cephalic vein in its proximal half  SPECIMEN(S):  None  CONTRAST: 15 cc  FLUOROSCOPY TIME: 0.9 minutes  INDICATIONS: Johnny [name]. is a 65 y.o. male who  presents with malfunctioning left brachiocephalic AV access.  The patient is scheduled for angiography with possible intervention of the AV access.  The patient is aware the risks include but are not limited to: bleeding, infection, thrombosis of the cannulated access, and possible anaphylactic reaction to the contrast.  The patient acknowledges if the access can not be salvaged a tunneled catheter will be needed and will be placed during this procedure.  The patient is aware of the risks of the procedure and elects to proceed with the angiogram and intervention.  DESCRIPTION: After full informed written consent was obtained, the patient was brought back to the Special Procedure suite and placed supine position.  Appropriate cardiopulmonary monitors were placed.  The left arm was prepped and draped in the standard fashion.  Appropriate timeout is called. The left brachiocephalic fistula  was cannulated with a micropuncture needle.  The microwire was advanced and the needle was exchanged for  a microsheath.  The J-wire was then advanced and a 6  Fr sheath inserted.  Hand injections were completed to image the access from the arterial anastomosis through the entire access.  The central venous structures were also imaged by hand injections.  INTERPRETATION: Based on the images,  there is occlusion of the proximal half of the cephalic vein including the previously stented subclavian cephalic confluence. The previously stented portions of the fistula which is the zone that is been accessed remains patent and the outflow was through several collaterals that then reconstituted a normal axillary vein. Subclavian vein is patent Viabahn stent extends only 2-3 mm into the lumen and is not flow limiting. The innominate and superior vena cava are widely patent. Reflux imaging demonstrates there are 2-3 cm of normal-appearing vein at the level of the anastomosis with the brachial artery.    A 4-0 Monocryl purse-string suture was sewn around the sheath.  The sheath was removed and light pressure was applied.  A sterile bandage was applied to the puncture site.  Based on the above images the patient is a good candidate for revision of his fistula into a brachial axillary AV graft. The origin of the existing fistula can be used 2 anastomosis to the new graft and the axillary vein appears more than adequate as the outflow area this will be discussed with the patient and I will plan for surgical revision and conversion of his existing access to a brachial axillary graft  COMPLICATIONS: None  CONDITION: Johnny [name], M.D [facility] Office: [phone]  09/01/2015 1:39 PM

## 2015-09-01 NOTE — H&P (Signed)
[facility] Admission History & Physical  MRN : [id number]  Johnny [name]. is a 65 y.o. (1950-07-17) male who presents with chief complaint of problems with my dialysis access.  History of Present Illness: The patient is sent for my evaluation by his dialysis center secondary to problems with the function of his dialysis access. There are having increasing problems with cannulation and his volume flow is markedly diminished. At this point he has not missed any dialysis runs but his dialysis parameters are all deteriorating. The access itself has had multiple interventions and has multiple stents placed.  Patient denies fever chills at home or while on dialysis. No pain or tenderness overlying his access. He denies symptoms consistent with steal syndrome. In particular he denies hand pain.  Current Facility-Administered Medications  Medication Dose Route Frequency Provider Last Rate Last Dose  . 0.9 %  sodium chloride infusion   Intravenous Continuous Janalyn Harder Stegmayer, PA-C 10 mL/hr at 09/01/15 0956    . cefUROXime (ZINACEF) 1.5 g in dextrose 5 % 50 mL IVPB  1.5 g Intravenous 30 min Pre-Op Kimberly A Stegmayer, PA-C      . famotidine (PEPCID) 20 MG tablet           . famotidine (PEPCID) tablet 20 mg  20 mg Oral Once Katha Cabal, MD      . famotidine (PEPCID) tablet 40 mg  40 mg Oral PRN Janalyn Harder Stegmayer, PA-C   40 mg at 09/01/15 0957  . HYDROmorphone (DILAUDID) injection 1 mg  1 mg Intravenous Once [employer], PA-C      . methylPREDNISolone sodium succinate (SOLU-MEDROL) 125 mg/2 mL injection 125 mg  125 mg Intravenous PRN Janalyn Harder Stegmayer, PA-C   125 mg at 09/01/15 0956  . methylPREDNISolone sodium succinate (SOLU-MEDROL) 125 mg/2 mL injection 125 mg  125 mg Intravenous Once Katha Cabal, MD      . methylPREDNISolone sodium succinate (SOLU-MEDROL) 125 mg/2 mL injection           . ondansetron (ZOFRAN) injection 4 mg  4 mg Intravenous  Q6H PRN Sela Hua, PA-C        Past Medical History  Diagnosis Date  . Hypertension   . Chronic kidney disease   . Gout     Past Surgical History  Procedure Laterality Date  . Dialysis fistula creation      Social History Social History  Substance Use Topics  . Smoking status: Not on file  . Smokeless tobacco: Not on file  . Alcohol Use: Not on file    Family History No family history on file. No family history of bleeding clotting disorders, porphyria, autoimmune disease  Allergies  Allergen Reactions  . Shellfish Allergy Swelling     REVIEW OF SYSTEMS (Negative unless checked)  Constitutional: [] Weight loss  [] Fever  [] Chills Cardiac: [] Chest pain   [] Chest pressure   [] Palpitations   [] Shortness of breath when laying flat   [] Shortness of breath at rest   [] Shortness of breath with exertion. Vascular:  [] Pain in legs with walking   [] Pain in legs at rest   [] Pain in legs when laying flat   [] Claudication   [] Pain in feet when walking  [] Pain in feet at rest  [] Pain in feet when laying flat   [] History of DVT   [] Phlebitis   [] Swelling in legs   [] Varicose veins   [] Non-healing ulcers Pulmonary:   [] Uses home oxygen   [] Productive cough   []   Hemoptysis   [] Wheeze  [] COPD   [] Asthma Neurologic:  [] Dizziness  [] Blackouts   [] Seizures   [] History of stroke   [] History of TIA  [] Aphasia   [] Temporary blindness   [] Dysphagia   [] Weakness or numbness in arms   [] Weakness or numbness in legs Musculoskeletal:  [] Arthritis   [] Joint swelling   [] Joint pain   [] Low back pain Hematologic:  [] Easy bruising  [] Easy bleeding   [] Hypercoagulable state   [] Anemic  [] Hepatitis Gastrointestinal:  [] Blood in stool   [] Vomiting blood  [] Gastroesophageal reflux/heartburn   [] Difficulty swallowing. Genitourinary:  [] Chronic kidney disease   [] Difficult urination  [] Frequent urination  [] Burning with urination   [] Blood in urine Skin:  [] Rashes   [] Ulcers   [] Wounds Psychological:   [] History of anxiety   []  History of major depression.  Physical Examination  Filed Vitals:   09/01/15 0934  BP: 148/98  Resp: 16  Height: 5\' 8"  (1.727 m)  Weight: 90.719 kg (200 lb)  SpO2: 100%   Body mass index is 30.42 kg/(m^2). Gen: WD/WN, NAD Head: [place]/AT, No temporalis wasting. Prominent temp pulse not noted. Ear/Nose/Throat: Hearing grossly intact, nares w/o erythema or drainage, oropharynx w/o Erythema/Exudate,  Eyes: PERRLA, EOMI.  Neck: Supple, no nuchal rigidity.  No bruit or JVD.  Pulmonary:  Good air movement, clear to auscultation bilaterally, no use of accessory muscles.  Cardiac: RRR, normal S1, S2, no Murmurs, rubs or gallops. Vascular: Left brachiocephalic fistula with extensive scarring moderate aneurysmal deterioration and significant skin changes. There is a soft thrill and a soft bruit noted. Vessel Right Left  Radial Palpable Palpable  Ulnar Not Palpable Not Palpable  Brachial Palpable Palpable  Gastrointestinal: soft, non-tender/non-distended. No guarding/reflex.  Musculoskeletal: M/S 5/5 throughout.  Extremities without ischemic changes.  No deformity or atrophy.  Neurologic: CN 2-12 intact. Pain and light touch intact in extremities.  Symmetrical.  Speech is fluent. Motor exam as listed above. Psychiatric: Judgment intact, Mood & affect appropriate for pt's clinical situation. Dermatologic: No rashes or ulcers noted.  No cellulitis or open wounds. Lymph : No Cervical, Axillary, or Inguinal lymphadenopathy.     CBC Lab Results  Component Value Date   WBC 12.2* 01/17/2014   HGB 8.8* 01/17/2014   HCT 27.9* 01/17/2014   MCV 93 01/17/2014   PLT 212 01/17/2014    BMET    Component Value Date/Time   NA 139 01/17/2014 0502   K 4.8 01/17/2014 0502   CL 102 01/17/2014 0502   CO2 27 01/17/2014 0502   GLUCOSE 101* 01/17/2014 0502   BUN 62* 01/17/2014 0502   CREATININE 12.30* 01/17/2014 0502   CALCIUM 8.2* 01/17/2014 0502   GFRNONAA 4* 01/17/2014  0502   GFRNONAA 7* 06/20/2011 2221   GFRAA 4* 01/17/2014 0502   GFRAA 9* 06/20/2011 2221   CrCl cannot be calculated (Patient has no serum creatinine result on file.).  COAG Lab Results  Component Value Date   INR 1.0 06/11/2011    Radiology No results found.  Assessment/Plan 1.  Complication dialysis device with poor function of his AV access:  Patient's left brachiocephalic dialysis access is not adequate to maintain appropriate hemodialysis. The patient will undergo angiography with the hope of salvage using interventional techniques. Potassium will be drawn to ensure that it is an appropriate level prior to performing thrombectomy. 2.  End-stage renal disease requiring hemodialysis:  Patient will continue dialysis therapy without further interruption if a successful salvage of his fistula is not achieved then a tunneled  catheter will be placed. Dialysis has already been arranged so that the patient doesn't miss any sessions 3.  Hypertension:  Patient will continue medical management; nephrology is following no changes in oral medications.     Schnier, Dolores Lory, MD  09/01/2015 10:43 AM

## 2015-09-02 ENCOUNTER — Encounter: Payer: Self-pay | Admitting: Vascular Surgery

## 2015-09-14 ENCOUNTER — Other Ambulatory Visit: Payer: Self-pay | Admitting: Vascular Surgery

## 2015-09-15 ENCOUNTER — Encounter
Admission: RE | Admit: 2015-09-15 | Discharge: 2015-09-15 | Disposition: A | Discharge status: Home / Self Care | Payer: Medicare Other | Source: Ambulatory Visit | Attending: Vascular Surgery | Admitting: Vascular Surgery

## 2015-09-15 DIAGNOSIS — Z9889 Other specified postprocedural states: Secondary | ICD-10-CM | POA: Diagnosis not present

## 2015-09-15 DIAGNOSIS — I120 Hypertensive chronic kidney disease with stage 5 chronic kidney disease or end stage renal disease: Secondary | ICD-10-CM | POA: Diagnosis present

## 2015-09-15 DIAGNOSIS — Z91013 Allergy to seafood: Secondary | ICD-10-CM | POA: Diagnosis not present

## 2015-09-15 DIAGNOSIS — N186 End stage renal disease: Secondary | ICD-10-CM | POA: Diagnosis not present

## 2015-09-15 DIAGNOSIS — Z8249 Family history of ischemic heart disease and other diseases of the circulatory system: Secondary | ICD-10-CM | POA: Diagnosis not present

## 2015-09-15 HISTORY — DX: Hyperlipidemia, unspecified: E78.5

## 2015-09-15 HISTORY — DX: Heart failure, unspecified: I50.9

## 2015-09-15 HISTORY — DX: Anemia, unspecified: D64.9

## 2015-09-15 LAB — BASIC METABOLIC PANEL
ANION GAP: 12 (ref 5–15)
BUN: 38 mg/dL — ABNORMAL HIGH (ref 6–20)
CALCIUM: 9.1 mg/dL (ref 8.9–10.3)
CO2: 27 mmol/L (ref 22–32)
CREATININE: 10.84 mg/dL — AB (ref 0.61–1.24)
Chloride: 100 mmol/L — ABNORMAL LOW (ref 101–111)
GFR, EST AFRICAN AMERICAN: 5 mL/min — AB (ref >60)
GFR, EST NON AFRICAN AMERICAN: 4 mL/min — AB (ref >60)
Glucose, Bld: 83 mg/dL (ref 65–99)
Potassium: 5.6 mmol/L — ABNORMAL HIGH (ref 3.5–5.1)
SODIUM: 139 mmol/L (ref 135–145)

## 2015-09-15 LAB — CBC WITH DIFFERENTIAL/PLATELET
BASOS ABS: 0.1 10*3/uL (ref 0–0.1)
BASOS PCT: 2 %
EOS ABS: 0.3 10*3/uL (ref 0–0.7)
Eosinophils Relative: 4 %
HEMATOCRIT: 34.7 % — AB (ref 40.0–52.0)
HEMOGLOBIN: 11.1 g/dL — AB (ref 13.0–18.0)
Lymphocytes Relative: 26 %
Lymphs Abs: 1.9 10*3/uL (ref 1.0–3.6)
MCH: 27.9 pg (ref 26.0–34.0)
MCHC: 31.9 g/dL — AB (ref 32.0–36.0)
MCV: 87.6 fL (ref 80.0–100.0)
MONOS PCT: 9 %
Monocytes Absolute: 0.6 10*3/uL (ref 0.2–1.0)
NEUTROS ABS: 4.4 10*3/uL (ref 1.4–6.5)
NEUTROS PCT: 59 %
Platelets: 153 10*3/uL (ref 150–440)
RBC: 3.96 MIL/uL — AB (ref 4.40–5.90)
RDW: 15.9 % — ABNORMAL HIGH (ref 11.5–14.5)
WBC: 7.4 10*3/uL (ref 3.8–10.6)

## 2015-09-15 LAB — SURGICAL PCR SCREEN
MRSA, PCR: NEGATIVE
Staphylococcus aureus: POSITIVE — AB

## 2015-09-15 LAB — TYPE AND SCREEN
ABO/RH(D): B NEG
ANTIBODY SCREEN: NEGATIVE

## 2015-09-15 LAB — ABO/RH: ABO/RH(D): B NEG

## 2015-09-15 LAB — PROTIME-INR
INR: 1.04
PROTHROMBIN TIME: 13.8 s (ref 11.4–15.0)

## 2015-09-15 LAB — APTT: aPTT: 31 seconds (ref 24–36)

## 2015-09-15 NOTE — Pre-Procedure Instructions (Signed)
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

## 2015-09-15 NOTE — Patient Instructions (Signed)
Your procedure is scheduled on: Friday 09/18/15 Report to Day Surgery. 2ND FLOOR MEDICAL MALL ENTRANCE To find out your arrival time please call [phone] between 1PM - 3PM on Thursday 09/17/15.  Remember: Instructions that are not followed completely may result in serious medical risk, up to and including death, or upon the discretion of your surgeon and anesthesiologist your surgery may need to be rescheduled.    __X__ 1. Do not eat food or drink liquids after midnight. No gum chewing or hard candies.     __X__ 2. No Alcohol for 24 hours before or after surgery.   ____ 3. Bring all medications with you on the day of surgery if instructed.    __X__ 4. Notify your doctor if there is any change in your medical condition     (cold, fever, infections).     Do not wear jewelry, make-up, hairpins, clips or nail polish.  Do not wear lotions, powders, or perfumes.   Do not shave 48 hours prior to surgery. Men may shave face and neck.  Do not bring valuables to the hospital.    [facility] is not responsible for any belongings or valuables.               Contacts, dentures or bridgework may not be worn into surgery.  Leave your suitcase in the car. After surgery it may be brought to your room.  For patients admitted to the hospital, discharge time is determined by your                treatment team.   Patients discharged the day of surgery will not be allowed to drive home.   Please read over the following fact sheets that you were given:   MRSA Information and Surgical Site Infection Prevention   __X__ Take these medicines the morning of surgery with A SIP OF WATER:    1. HYDRALAZINE  2. METOPROLOL  3. RAMIPRIL  4.  5.  6.  ____ Fleet Enema (as directed)   __X__ Use CHG Soap as directed  ____ Use inhalers on the day of surgery  ____ Stop metformin 2 days prior to surgery    ____ Take 1/2 of usual insulin dose the night before surgery and none on the morning of surgery.    ____ Stop Coumadin/Plavix/aspirin on   ____ Stop Anti-inflammatories on    ____ Stop supplements until after surgery.    ____ Bring C-Pap to the hospital.

## 2015-09-15 NOTE — Pre-Procedure Instructions (Signed)
Faxed labs and pcr screen to Dr Delana Meyer office

## 2015-09-18 ENCOUNTER — Ambulatory Visit
Admission: RE | Admit: 2015-09-18 | Discharge: 2015-09-18 | Disposition: A | Discharge status: Home / Self Care | Payer: Medicare Other | Source: Ambulatory Visit | Attending: Vascular Surgery | Admitting: Vascular Surgery

## 2015-09-18 ENCOUNTER — Encounter
Admission: RE | Disposition: A | Discharge status: Home / Self Care | Payer: Self-pay | Source: Ambulatory Visit | Attending: Vascular Surgery

## 2015-09-18 ENCOUNTER — Ambulatory Visit: Payer: Medicare Other | Admitting: Certified Registered"

## 2015-09-18 ENCOUNTER — Encounter: Payer: Self-pay | Admitting: *Deleted

## 2015-09-18 DIAGNOSIS — Z8249 Family history of ischemic heart disease and other diseases of the circulatory system: Secondary | ICD-10-CM | POA: Insufficient documentation

## 2015-09-18 DIAGNOSIS — I120 Hypertensive chronic kidney disease with stage 5 chronic kidney disease or end stage renal disease: Secondary | ICD-10-CM | POA: Insufficient documentation

## 2015-09-18 DIAGNOSIS — Z9889 Other specified postprocedural states: Secondary | ICD-10-CM | POA: Insufficient documentation

## 2015-09-18 DIAGNOSIS — N186 End stage renal disease: Secondary | ICD-10-CM | POA: Insufficient documentation

## 2015-09-18 DIAGNOSIS — Z91013 Allergy to seafood: Secondary | ICD-10-CM | POA: Insufficient documentation

## 2015-09-18 HISTORY — PX: AV FISTULA PLACEMENT: SHX1204

## 2015-09-18 LAB — POCT I-STAT 4, (NA,K, GLUC, HGB,HCT)
GLUCOSE: 79 mg/dL (ref 65–99)
HCT: 36 % — ABNORMAL LOW (ref 39.0–52.0)
HEMOGLOBIN: 12.2 g/dL — AB (ref 13.0–17.0)
POTASSIUM: 5.3 mmol/L — AB (ref 3.5–5.1)
Sodium: 139 mmol/L (ref 135–145)

## 2015-09-18 SURGERY — INSERTION OF ARTERIOVENOUS (AV) GORE-TEX GRAFT ARM
Anesthesia: General | Laterality: Left | Wound class: Clean

## 2015-09-18 MED ORDER — MAGNESIUM SULFATE 50 % IJ SOLN
INTRAMUSCULAR | Status: DC | PRN
  Administered 2015-09-18: 1 g via INTRAVENOUS

## 2015-09-18 MED ORDER — CEFAZOLIN SODIUM-DEXTROSE 2-4 GM/100ML-% IV SOLN
INTRAVENOUS | Status: AC
  Administered 2015-09-18: 2 g via INTRAVENOUS
  Filled 2015-09-18: qty 100

## 2015-09-18 MED ORDER — FAMOTIDINE 20 MG PO TABS
ORAL_TABLET | ORAL | Status: AC
  Administered 2015-09-18: 20 mg via ORAL
  Filled 2015-09-18: qty 1

## 2015-09-18 MED ORDER — CEFAZOLIN SODIUM-DEXTROSE 2-4 GM/100ML-% IV SOLN
2.0000 g | INTRAVENOUS | Status: AC
  Administered 2015-09-18: 2 g via INTRAVENOUS

## 2015-09-18 MED ORDER — HYDROMORPHONE HCL 1 MG/ML IJ SOLN
INTRAMUSCULAR | Status: AC
  Administered 2015-09-18: 0.25 mg via INTRAVENOUS
  Filled 2015-09-18: qty 1

## 2015-09-18 MED ORDER — SODIUM CHLORIDE 0.9 % IV SOLN
10000.0000 ug | INTRAVENOUS | Status: DC | PRN
  Administered 2015-09-18: 30 ug/min via INTRAVENOUS

## 2015-09-18 MED ORDER — ONDANSETRON HCL 4 MG/2ML IJ SOLN
INTRAMUSCULAR | Status: DC | PRN
  Administered 2015-09-18: 4 mg via INTRAVENOUS

## 2015-09-18 MED ORDER — FENTANYL CITRATE (PF) 100 MCG/2ML IJ SOLN
INTRAMUSCULAR | Status: DC | PRN
  Administered 2015-09-18 (×5): 50 ug via INTRAVENOUS

## 2015-09-18 MED ORDER — BUPIVACAINE HCL (PF) 0.5 % IJ SOLN
INTRAMUSCULAR | Status: AC
  Filled 2015-09-18: qty 30

## 2015-09-18 MED ORDER — HEPARIN SODIUM (PORCINE) 5000 UNIT/ML IJ SOLN
INTRAMUSCULAR | Status: AC
  Filled 2015-09-18: qty 1

## 2015-09-18 MED ORDER — HYDROMORPHONE HCL 1 MG/ML IJ SOLN
0.2500 mg | INTRAMUSCULAR | Status: DC | PRN
  Administered 2015-09-18 (×2): 0.25 mg via INTRAVENOUS

## 2015-09-18 MED ORDER — DEXAMETHASONE SODIUM PHOSPHATE 10 MG/ML IJ SOLN
8.0000 mg | Freq: Once | INTRAMUSCULAR | Status: AC
  Administered 2015-09-18: 8 mg via INTRAVENOUS

## 2015-09-18 MED ORDER — METOCLOPRAMIDE HCL 5 MG/ML IJ SOLN
10.0000 mg | Freq: Once | INTRAMUSCULAR | Status: AC | PRN
  Administered 2015-09-18: 10 mg via INTRAVENOUS

## 2015-09-18 MED ORDER — METOCLOPRAMIDE HCL 5 MG/ML IJ SOLN
INTRAMUSCULAR | Status: AC
  Administered 2015-09-18: 10 mg via INTRAVENOUS
  Filled 2015-09-18: qty 2

## 2015-09-18 MED ORDER — SODIUM CHLORIDE FLUSH 0.9 % IV SOLN
INTRAVENOUS | Status: AC
  Administered 2015-09-18: 10 mL
  Filled 2015-09-18: qty 6

## 2015-09-18 MED ORDER — FAMOTIDINE 20 MG PO TABS
20.0000 mg | ORAL_TABLET | Freq: Once | ORAL | Status: AC
  Administered 2015-09-18: 20 mg via ORAL

## 2015-09-18 MED ORDER — SODIUM CHLORIDE FLUSH 0.9 % IV SOLN
INTRAVENOUS | Status: AC
  Filled 2015-09-18: qty 6

## 2015-09-18 MED ORDER — SODIUM CHLORIDE 0.9 % IJ SOLN
INTRAMUSCULAR | Status: AC
  Administered 2015-09-18: 10 mL
  Filled 2015-09-18: qty 20

## 2015-09-18 MED ORDER — PROPOFOL 10 MG/ML IV BOLUS
INTRAVENOUS | Status: DC | PRN
  Administered 2015-09-18: 180 mg via INTRAVENOUS

## 2015-09-18 MED ORDER — BUPIVACAINE HCL (PF) 0.5 % IJ SOLN
INTRAMUSCULAR | Status: DC | PRN
  Administered 2015-09-18: 17 mL

## 2015-09-18 MED ORDER — PHENYLEPHRINE HCL 10 MG/ML IJ SOLN
INTRAMUSCULAR | Status: DC | PRN
  Administered 2015-09-18 (×2): 200 ug via INTRAVENOUS
  Administered 2015-09-18: 300 ug via INTRAVENOUS
  Administered 2015-09-18 (×2): 100 ug via INTRAVENOUS

## 2015-09-18 MED ORDER — DEXAMETHASONE SODIUM PHOSPHATE 10 MG/ML IJ SOLN
INTRAMUSCULAR | Status: AC
  Administered 2015-09-18: 8 mg via INTRAVENOUS
  Filled 2015-09-18: qty 1

## 2015-09-18 MED ORDER — ONDANSETRON HCL 4 MG/2ML IJ SOLN
INTRAMUSCULAR | Status: AC
  Filled 2015-09-18: qty 2

## 2015-09-18 MED ORDER — LIDOCAINE HCL (CARDIAC) 20 MG/ML IV SOLN
INTRAVENOUS | Status: DC | PRN
  Administered 2015-09-18: 60 mg via INTRAVENOUS

## 2015-09-18 MED ORDER — SODIUM CHLORIDE 0.9 % IV SOLN
INTRAVENOUS | Status: DC
  Administered 2015-09-18: 07:00:00 via INTRAVENOUS

## 2015-09-18 MED ORDER — PAPAVERINE HCL 30 MG/ML IJ SOLN
INTRAMUSCULAR | Status: AC
  Filled 2015-09-18: qty 2

## 2015-09-18 MED ORDER — EPHEDRINE SULFATE 50 MG/ML IJ SOLN
INTRAMUSCULAR | Status: DC | PRN
  Administered 2015-09-18 (×2): 10 mg via INTRAVENOUS

## 2015-09-18 MED ORDER — OXYCODONE-ACETAMINOPHEN 5-325 MG PO TABS: 1.0000 | ORAL_TABLET | Freq: Four times a day (QID) | ORAL | Status: DC | PRN

## 2015-09-18 MED ORDER — ONDANSETRON HCL 4 MG/2ML IJ SOLN: 4.0000 mg | Freq: Once | INTRAMUSCULAR | Status: DC

## 2015-09-18 SURGICAL SUPPLY — 57 items
APPLIER CLIP 11 MED OPEN (CLIP)
APPLIER CLIP 9.375 SM OPEN (CLIP)
BAG COUNTER SPONGE EZ (MISCELLANEOUS) IMPLANT
BAG DECANTER FOR FLEXI CONT (MISCELLANEOUS) ×2 IMPLANT
BLADE SURG SZ11 CARB STEEL (BLADE) ×2 IMPLANT
BOOT SUTURE AID YELLOW STND (SUTURE) ×2 IMPLANT
BRUSH SCRUB 4% CHG (MISCELLANEOUS) ×2 IMPLANT
CANISTER SUCT 1200ML W/VALVE (MISCELLANEOUS) ×2 IMPLANT
CHLORAPREP W/TINT 26ML (MISCELLANEOUS) ×2 IMPLANT
CLIP APPLIE 11 MED OPEN (CLIP) IMPLANT
CLIP APPLIE 9.375 SM OPEN (CLIP) IMPLANT
DRESSING SURGICEL FIBRLLR 1X2 (HEMOSTASIS) ×1 IMPLANT
DRSG SURGICEL FIBRILLAR 1X2 (HEMOSTASIS) ×2
ELECT CAUTERY BLADE 6.4 (BLADE) ×2 IMPLANT
ELECT REM PT RETURN 9FT ADLT (ELECTROSURGICAL) ×2
ELECTRODE REM PT RTRN 9FT ADLT (ELECTROSURGICAL) ×1 IMPLANT
GLOVE BIO SURGEON STRL SZ7 (GLOVE) ×6 IMPLANT
GLOVE INDICATOR 7.5 STRL GRN (GLOVE) ×4 IMPLANT
GLOVE SURG SYN 8.0 (GLOVE) ×2 IMPLANT
GOWN STRL REUS W/ TWL LRG LVL3 (GOWN DISPOSABLE) ×2 IMPLANT
GOWN STRL REUS W/ TWL XL LVL3 (GOWN DISPOSABLE) ×1 IMPLANT
GOWN STRL REUS W/TWL LRG LVL3 (GOWN DISPOSABLE) ×2
GOWN STRL REUS W/TWL XL LVL3 (GOWN DISPOSABLE) ×1
GRAFT VASC ACUSEAL 4-7X45 (Vascular Products) ×2 IMPLANT
IV NS 500ML (IV SOLUTION) ×1
IV NS 500ML BAXH (IV SOLUTION) ×1 IMPLANT
KIT RM TURNOVER STRD PROC AR (KITS) ×2 IMPLANT
LABEL OR SOLS (LABEL) ×2 IMPLANT
LIQUID BAND (GAUZE/BANDAGES/DRESSINGS) ×2 IMPLANT
LOOP RED MAXI  1X406MM (MISCELLANEOUS) ×1
LOOP VESSEL MAXI 1X406 RED (MISCELLANEOUS) ×1 IMPLANT
LOOP VESSEL MINI 0.8X406 BLUE (MISCELLANEOUS) ×2 IMPLANT
LOOPS BLUE MINI 0.8X406MM (MISCELLANEOUS) ×2
NEEDLE FILTER BLUNT 18X 1/2SAF (NEEDLE) ×1
NEEDLE FILTER BLUNT 18X1 1/2 (NEEDLE) ×1 IMPLANT
NS IRRIG 500ML POUR BTL (IV SOLUTION) ×2 IMPLANT
PACK EXTREMITY ARMC (MISCELLANEOUS) ×2 IMPLANT
PAD PREP 24X41 OB/GYN DISP (PERSONAL CARE ITEMS) IMPLANT
PUNCH SURGICAL ROTATE 2.7MM (MISCELLANEOUS) IMPLANT
STOCKINETTE STRL 4IN 9604848 (GAUZE/BANDAGES/DRESSINGS) ×2 IMPLANT
SUT ETHIBOND CT1 BRD #0 30IN (SUTURE) ×2 IMPLANT
SUT GORETEX CV-6TTC-13 36IN (SUTURE) ×2 IMPLANT
SUT GTX CV-6 30 (SUTURE) ×6 IMPLANT
SUT MNCRL+ 5-0 UNDYED PC-3 (SUTURE) ×1 IMPLANT
SUT MONOCRYL 5-0 (SUTURE) ×1
SUT PROLENE 6 0 BV (SUTURE) ×4 IMPLANT
SUT SILK 2 0 (SUTURE) ×1
SUT SILK 2 0 SH (SUTURE) ×2 IMPLANT
SUT SILK 2-0 18XBRD TIE 12 (SUTURE) ×1 IMPLANT
SUT SILK 3 0 (SUTURE) ×1
SUT SILK 3-0 18XBRD TIE 12 (SUTURE) ×1 IMPLANT
SUT SILK 4 0 (SUTURE) ×1
SUT SILK 4-0 18XBRD TIE 12 (SUTURE) ×1 IMPLANT
SUT VIC AB 3-0 SH 27 (SUTURE) ×3
SUT VIC AB 3-0 SH 27X BRD (SUTURE) ×3 IMPLANT
SYR 20CC LL (SYRINGE) ×2 IMPLANT
SYR 3ML LL SCALE MARK (SYRINGE) ×2 IMPLANT

## 2015-09-18 NOTE — OR Nursing (Signed)
Dialysis appt scheduled for patient for tomorrow.

## 2015-09-18 NOTE — Anesthesia Preprocedure Evaluation (Signed)
Anesthesia Evaluation  Patient identified by MRN, date of birth, ID band Patient awake    Reviewed: Allergy & Precautions, NPO status , Patient's Chart, lab work & pertinent test results  Airway Mallampati: III  TM Distance: >3 FB Neck ROM: Limited    Dental  (+) Upper Dentures, Lower Dentures   Pulmonary former smoker,    Pulmonary exam normal        Cardiovascular Exercise Tolerance: Poor hypertension, Pt. on medications and Pt. on home beta blockers +CHF  Normal cardiovascular exam     Neuro/Psych    GI/Hepatic   Endo/Other    Renal/GU ESRF and DialysisRenal disease     Musculoskeletal   Abdominal (+) + obese,  Abdomen: soft.    Peds  Hematology  (+) anemia , Hb 11.1.   Anesthesia Other Findings   Reproductive/Obstetrics                             Anesthesia Physical Anesthesia Plan  ASA: IV  Anesthesia Plan: General   Post-op Pain Management:    Induction: Intravenous  Airway Management Planned: LMA  Additional Equipment:   Intra-op Plan:   Post-operative Plan: Extubation in OR  Informed Consent: I have reviewed the patients History and Physical, chart, labs and discussed the procedure including the risks, benefits and alternatives for the proposed anesthesia with the patient or authorized representative who has indicated his/her understanding and acceptance.     Plan Discussed with: CRNA  Anesthesia Plan Comments:         Anesthesia Quick Evaluation

## 2015-09-18 NOTE — Anesthesia Procedure Notes (Signed)
Procedure Name: LMA Insertion Performed by: Lance Muss Pre-anesthesia Checklist: Emergency Drugs available, Patient identified, Suction available, Patient being monitored and Timeout performed Patient Re-evaluated:Patient Re-evaluated prior to inductionOxygen Delivery Method: Circle system utilized Preoxygenation: Pre-oxygenation with 100% oxygen Intubation Type: IV induction LMA: LMA inserted LMA Size: 4.0 Number of attempts: 1 Tube secured with: Tape Dental Injury: Teeth and Oropharynx as per pre-operative assessment

## 2015-09-18 NOTE — Anesthesia Postprocedure Evaluation (Signed)
Anesthesia Post Note  Patient: Johnny [name].  Procedure(s) Performed: Procedure(s) (LRB): INSERTION OF ARTERIOVENOUS (AV) GORE-TEX GRAFT ARM ( BRACH/AXILLARY GRAFT W/ INSTANT STICK GRAFT ) (Left)  Patient location during evaluation: PACU Anesthesia Type: General Level of consciousness: awake and alert Pain management: pain level controlled Vital Signs Assessment: post-procedure vital signs reviewed and stable Respiratory status: spontaneous breathing Cardiovascular status: blood pressure returned to baseline Postop Assessment: no headache Anesthetic complications: no    Last Vitals:  Filed Vitals:   09/18/15 1130 09/18/15 1145  BP: 134/71   Pulse: 61 63  Temp:    Resp: 14 12    Last Pain:  Filed Vitals:   09/18/15 1148  PainSc: 5                  Yudit Modesitt M

## 2015-09-18 NOTE — Op Note (Signed)
     OPERATIVE NOTE   PROCEDURE: left brachial axillary AV graft placement; Gore AcuSeal  PRE-OPERATIVE DIAGNOSIS: End Stage Renal Disease  POST-OPERATIVE DIAGNOSIS: End Stage Renal Disease  SURGEON: Civil engineer, contracting, Dolores Lory  ASSISTANT(S): Ms. Hezzie Bump  ANESTHESIA: general  ESTIMATED BLOOD LOSS: <50 cc  FINDING(S): Axillary vein proximally 10 mm  SPECIMEN(S):  none  INDICATIONS:   Denver Kibbey. is a 65 y.o. male who presents with end stage renal disease.  The patient is scheduled for left brachial axillary arteriovenous graft placement.  The patient is aware the risks include but are not limited to: bleeding, infection, steal syndrome, nerve damage, ischemic monomelic neuropathy, failure to mature, and need for additional procedures.  The patient is aware of the risks of the procedure and elects to proceed forward.  DESCRIPTION: After full informed written consent was obtained from the patient, the patient was brought back to the operating room and placed supine upon the operating table.  Prior to induction, the patient received IV antibiotics.   After obtaining adequate anesthesia, the patient was then prepped and draped in the standard fashion for a left arm access procedure.    A linear incision was then created through the previous incisional scar in the antecubital fossa. The anastomosis of the brachiocephalic fistula was then identified and dissected circumferentially. It was marked with a surgical marker.    The axillary vein was then exposed through a linear incision the anterior axillary line and dissected circumferentially. It was then looped with Silastic Vesseloops.  At this point, I reset my exposure of the brachial artery and controlled the artery with vessel loops proximally and distally.    Gore tunneler was then used to create a subcutaneous path and a 4-7 mm tapered AcuSeal graft was pulled subcutaneously.  Attention was then returned to the arterial  portion area did 0 Ethibond was used to ligate the fistula above the level of the presumed anastomosis. Profunda femoris clamp was then used to clamp the vein at the suture line and the vein was transected with Potts scissors. The AcuSeal graft was then beveled and an end graft to end vein anastomosis was then fashioned using running CV 6 suture. Flushing maneuvers were performed and the graft with subsequent irrigated with heparinized saline. Under arterial pressure of the graft was checked along its course and found to be easily palpable with a smooth contour.  The graft was then approximated to the axillary vein beveled axillary vein was controlled with Silastic Vesseloops venotomy was made extended with Potts scissors area 6-0 Prolene stay sutures were placed. End graft to side axillary vein anastomosis was then fashioned with running CV 6 suture. Flushing maneuvers were performed and flow was established through the graft. Excellent thrill was noted.  There was good  thrill in the venous outflow, and there was 1+ palpable radial pulse.  At this point, I irrigated out the surgical wound.  There was no further active bleeding.  The subcutaneous tissue was reapproximated with a running stitch of 3-0 Vicryl.  The skin was then reapproximated with a running subcuticular stitch of 4-0 Vicryl.  The skin was then cleaned, dried, and reinforced with Dermabond.    The patient tolerated this procedure well.   COMPLICATIONS: None  CONDITION: [facility]  Office: [phone]   09/18/2015, 10:02 AM

## 2015-09-18 NOTE — Transfer of Care (Signed)
Immediate Anesthesia Transfer of Care Note  Patient: Johnny [name].  Procedure(s) Performed: Procedure(s): INSERTION OF ARTERIOVENOUS (AV) GORE-TEX GRAFT ARM ( BRACH/AXILLARY GRAFT W/ INSTANT STICK GRAFT ) (Left)  Patient Location: PACU  Anesthesia Type:General  Level of Consciousness: awake, alert  and responds to stimulation  Airway & Oxygen Therapy: Patient Spontanous Breathing and Patient connected to face mask oxygen  Post-op Assessment: Report given to RN and Post -op Vital signs reviewed and stable  Post vital signs: Reviewed and stable  Last Vitals:  Filed Vitals:   09/18/15 0615 09/18/15 1021  BP: 185/88 165/87  Pulse: 69 66  Temp: 35.7 C   Resp: 16 17    Last Pain: There were no vitals filed for this visit.       Complications: No apparent anesthesia complications

## 2015-09-18 NOTE — Discharge Instructions (Signed)

## 2015-09-18 NOTE — H&P (Signed)
[facility] History & Physical Update  The patient was interviewed and re-examined.  The patient's previous History and Physical has been reviewed and is unchanged.  There is no change in the plan of care. We plan to proceed with the scheduled procedure.  Schnier, Dolores Lory, MD  09/18/2015, 7:32 AM

## 2015-09-28 ENCOUNTER — Inpatient Hospital Stay
Admission: EM | Admit: 2015-09-28 | Discharge: 2015-10-02 | DRG: 252 | Disposition: A | Discharge status: Home / Self Care | Payer: Medicare Other | Attending: Internal Medicine | Admitting: Internal Medicine

## 2015-09-28 ENCOUNTER — Emergency Department: Payer: Medicare Other

## 2015-09-28 ENCOUNTER — Encounter: Payer: Self-pay | Admitting: Emergency Medicine

## 2015-09-28 DIAGNOSIS — Z79899 Other long term (current) drug therapy: Secondary | ICD-10-CM

## 2015-09-28 DIAGNOSIS — I25110 Atherosclerotic heart disease of native coronary artery with unstable angina pectoris: Secondary | ICD-10-CM | POA: Diagnosis present

## 2015-09-28 DIAGNOSIS — T8249XA Other complication of vascular dialysis catheter, initial encounter: Secondary | ICD-10-CM

## 2015-09-28 DIAGNOSIS — T827XXA Infection and inflammatory reaction due to other cardiac and vascular devices, implants and grafts, initial encounter: Principal | ICD-10-CM | POA: Diagnosis present

## 2015-09-28 DIAGNOSIS — R778 Other specified abnormalities of plasma proteins: Secondary | ICD-10-CM

## 2015-09-28 DIAGNOSIS — Z8249 Family history of ischemic heart disease and other diseases of the circulatory system: Secondary | ICD-10-CM | POA: Diagnosis not present

## 2015-09-28 DIAGNOSIS — I255 Ischemic cardiomyopathy: Secondary | ICD-10-CM | POA: Diagnosis present

## 2015-09-28 DIAGNOSIS — Z992 Dependence on renal dialysis: Secondary | ICD-10-CM | POA: Diagnosis not present

## 2015-09-28 DIAGNOSIS — I5032 Chronic diastolic (congestive) heart failure: Secondary | ICD-10-CM | POA: Diagnosis present

## 2015-09-28 DIAGNOSIS — N186 End stage renal disease: Secondary | ICD-10-CM

## 2015-09-28 DIAGNOSIS — I214 Non-ST elevation (NSTEMI) myocardial infarction: Secondary | ICD-10-CM | POA: Diagnosis present

## 2015-09-28 DIAGNOSIS — Y848 Other medical procedures as the cause of abnormal reaction of the patient, or of later complication, without mention of misadventure at the time of the procedure: Secondary | ICD-10-CM | POA: Diagnosis present

## 2015-09-28 DIAGNOSIS — I10 Essential (primary) hypertension: Secondary | ICD-10-CM | POA: Diagnosis present

## 2015-09-28 DIAGNOSIS — R0789 Other chest pain: Secondary | ICD-10-CM | POA: Diagnosis present

## 2015-09-28 DIAGNOSIS — I132 Hypertensive heart and chronic kidney disease with heart failure and with stage 5 chronic kidney disease, or end stage renal disease: Secondary | ICD-10-CM | POA: Diagnosis present

## 2015-09-28 DIAGNOSIS — M109 Gout, unspecified: Secondary | ICD-10-CM | POA: Diagnosis present

## 2015-09-28 DIAGNOSIS — E785 Hyperlipidemia, unspecified: Secondary | ICD-10-CM | POA: Diagnosis present

## 2015-09-28 DIAGNOSIS — R079 Chest pain, unspecified: Secondary | ICD-10-CM

## 2015-09-28 DIAGNOSIS — Z87891 Personal history of nicotine dependence: Secondary | ICD-10-CM | POA: Diagnosis not present

## 2015-09-28 DIAGNOSIS — N2581 Secondary hyperparathyroidism of renal origin: Secondary | ICD-10-CM | POA: Diagnosis present

## 2015-09-28 DIAGNOSIS — D631 Anemia in chronic kidney disease: Secondary | ICD-10-CM | POA: Diagnosis present

## 2015-09-28 DIAGNOSIS — R7989 Other specified abnormal findings of blood chemistry: Secondary | ICD-10-CM

## 2015-09-28 LAB — CBC WITH DIFFERENTIAL/PLATELET
BASOS ABS: 0 10*3/uL (ref 0–0.1)
EOS ABS: 0.1 10*3/uL (ref 0–0.7)
Eosinophils Relative: 2 %
HEMATOCRIT: 31 % — AB (ref 40.0–52.0)
HEMOGLOBIN: 9.9 g/dL — AB (ref 13.0–18.0)
Lymphocytes Relative: 12 %
Lymphs Abs: 0.9 10*3/uL — ABNORMAL LOW (ref 1.0–3.6)
MCH: 27.7 pg (ref 26.0–34.0)
MCHC: 31.8 g/dL — AB (ref 32.0–36.0)
MCV: 87.1 fL (ref 80.0–100.0)
Monocytes Absolute: 1 10*3/uL (ref 0.2–1.0)
Monocytes Relative: 14 %
NEUTROS ABS: 5.4 10*3/uL (ref 1.4–6.5)
Platelets: 135 10*3/uL — ABNORMAL LOW (ref 150–440)
RBC: 3.56 MIL/uL — ABNORMAL LOW (ref 4.40–5.90)
RDW: 16.6 % — AB (ref 11.5–14.5)
WBC: 7.5 10*3/uL (ref 3.8–10.6)

## 2015-09-28 LAB — BASIC METABOLIC PANEL
Anion gap: 12 (ref 5–15)
BUN: 26 mg/dL — AB (ref 6–20)
CALCIUM: 9.8 mg/dL (ref 8.9–10.3)
CHLORIDE: 107 mmol/L (ref 101–111)
CO2: 21 mmol/L — ABNORMAL LOW (ref 22–32)
CREATININE: 8.51 mg/dL — AB (ref 0.61–1.24)
GFR calc non Af Amer: 6 mL/min — ABNORMAL LOW (ref >60)
GFR, EST AFRICAN AMERICAN: 7 mL/min — AB (ref >60)
Glucose, Bld: 69 mg/dL (ref 65–99)
Potassium: 5.3 mmol/L — ABNORMAL HIGH (ref 3.5–5.1)
SODIUM: 140 mmol/L (ref 135–145)

## 2015-09-28 LAB — PROTIME-INR
INR: 1.18
PROTHROMBIN TIME: 15.2 s — AB (ref 11.4–15.0)

## 2015-09-28 LAB — TROPONIN I: TROPONIN I: 0.07 ng/mL — AB (ref <0.031)

## 2015-09-28 LAB — APTT: APTT: 38 s — AB (ref 24–36)

## 2015-09-28 MED ORDER — HEPARIN BOLUS VIA INFUSION
3000.0000 [IU] | Freq: Once | INTRAVENOUS | Status: AC
  Administered 2015-09-28: 3000 [IU] via INTRAVENOUS
  Filled 2015-09-28: qty 3000

## 2015-09-28 MED ORDER — METHYLPREDNISOLONE SODIUM SUCC 125 MG IJ SOLR
125.0000 mg | Freq: Once | INTRAMUSCULAR | Status: AC
  Administered 2015-09-28: 125 mg via INTRAVENOUS
  Filled 2015-09-28: qty 2

## 2015-09-28 MED ORDER — CINACALCET HCL 30 MG PO TABS
90.0000 mg | ORAL_TABLET | Freq: Two times a day (BID) | ORAL | Status: DC
  Administered 2015-09-29 – 2015-10-02 (×6): 90 mg via ORAL
  Filled 2015-09-28 (×8): qty 3

## 2015-09-28 MED ORDER — SODIUM CHLORIDE 0.9% FLUSH
3.0000 mL | Freq: Two times a day (BID) | INTRAVENOUS | Status: DC
  Administered 2015-09-28 – 2015-10-01 (×7): 3 mL via INTRAVENOUS

## 2015-09-28 MED ORDER — CLONAZEPAM 0.5 MG PO TABS
0.5000 mg | ORAL_TABLET | Freq: Every day | ORAL | Status: DC
  Administered 2015-09-28 – 2015-10-01 (×4): 0.5 mg via ORAL
  Filled 2015-09-28 (×4): qty 1

## 2015-09-28 MED ORDER — HEPARIN (PORCINE) IN NACL 100-0.45 UNIT/ML-% IJ SOLN
1100.0000 [IU]/h | INTRAMUSCULAR | Status: DC
  Administered 2015-09-28: 1100 [IU]/h via INTRAVENOUS
  Filled 2015-09-28 (×2): qty 250

## 2015-09-28 MED ORDER — HYDRALAZINE HCL 25 MG PO TABS
25.0000 mg | ORAL_TABLET | Freq: Two times a day (BID) | ORAL | Status: DC
  Administered 2015-09-28 – 2015-10-01 (×7): 25 mg via ORAL
  Filled 2015-09-28 (×7): qty 1

## 2015-09-28 MED ORDER — SEVELAMER CARBONATE 800 MG PO TABS
2400.0000 mg | ORAL_TABLET | Freq: Three times a day (TID) | ORAL | Status: DC
  Administered 2015-09-29 – 2015-10-02 (×8): 2400 mg via ORAL
  Filled 2015-09-28 (×8): qty 3

## 2015-09-28 MED ORDER — COLCHICINE 0.6 MG PO TABS: 0.6000 mg | ORAL_TABLET | Freq: Every day | ORAL | Status: DC | PRN

## 2015-09-28 MED ORDER — ACETAMINOPHEN 325 MG PO TABS: 650.0000 mg | ORAL_TABLET | Freq: Four times a day (QID) | ORAL | Status: DC | PRN

## 2015-09-28 MED ORDER — MORPHINE SULFATE (PF) 2 MG/ML IV SOLN: 2.0000 mg | INTRAVENOUS | Status: DC | PRN

## 2015-09-28 MED ORDER — LABETALOL HCL 5 MG/ML IV SOLN
10.0000 mg | INTRAVENOUS | Status: DC | PRN
  Administered 2015-09-28: 10 mg via INTRAVENOUS
  Filled 2015-09-28: qty 4

## 2015-09-28 MED ORDER — ONDANSETRON HCL 4 MG/2ML IJ SOLN: 4.0000 mg | Freq: Four times a day (QID) | INTRAMUSCULAR | Status: DC | PRN

## 2015-09-28 MED ORDER — METOPROLOL TARTRATE 50 MG PO TABS
50.0000 mg | ORAL_TABLET | Freq: Two times a day (BID) | ORAL | Status: DC
  Administered 2015-09-28 – 2015-10-01 (×7): 50 mg via ORAL
  Filled 2015-09-28 (×7): qty 1

## 2015-09-28 MED ORDER — DIPHENHYDRAMINE HCL 50 MG/ML IJ SOLN
50.0000 mg | Freq: Once | INTRAMUSCULAR | Status: AC
  Administered 2015-09-28: 50 mg via INTRAVENOUS
  Filled 2015-09-28: qty 1

## 2015-09-28 MED ORDER — ASPIRIN 81 MG PO CHEW
324.0000 mg | CHEWABLE_TABLET | Freq: Once | ORAL | Status: AC
  Administered 2015-09-28: 324 mg via ORAL
  Filled 2015-09-28: qty 4

## 2015-09-28 MED ORDER — NITROGLYCERIN 0.4 MG SL SUBL
0.4000 mg | SUBLINGUAL_TABLET | SUBLINGUAL | Status: DC | PRN
  Administered 2015-09-28: 0.4 mg via SUBLINGUAL
  Filled 2015-09-28: qty 1

## 2015-09-28 MED ORDER — CALCIUM ACETATE (PHOS BINDER) 667 MG PO CAPS
1334.0000 mg | ORAL_CAPSULE | Freq: Three times a day (TID) | ORAL | Status: DC
  Administered 2015-09-29 – 2015-10-02 (×8): 1334 mg via ORAL
  Filled 2015-09-28 (×8): qty 2

## 2015-09-28 MED ORDER — RAMIPRIL 10 MG PO CAPS
10.0000 mg | ORAL_CAPSULE | Freq: Every day | ORAL | Status: DC
  Administered 2015-09-28 – 2015-10-01 (×4): 10 mg via ORAL
  Filled 2015-09-28 (×4): qty 1

## 2015-09-28 MED ORDER — FUROSEMIDE 40 MG PO TABS
40.0000 mg | ORAL_TABLET | ORAL | Status: DC
  Administered 2015-09-29 – 2015-10-01 (×2): 40 mg via ORAL
  Filled 2015-09-28 (×2): qty 1

## 2015-09-28 MED ORDER — ACETAMINOPHEN 650 MG RE SUPP
650.0000 mg | Freq: Four times a day (QID) | RECTAL | Status: DC | PRN
Start: 2015-09-28 — End: 2015-10-02

## 2015-09-28 MED ORDER — IOPAMIDOL (ISOVUE-370) INJECTION 76%
75.0000 mL | Freq: Once | INTRAVENOUS | Status: AC | PRN
  Administered 2015-09-28: 75 mL via INTRAVENOUS

## 2015-09-28 MED ORDER — ONDANSETRON HCL 4 MG PO TABS: 4.0000 mg | ORAL_TABLET | Freq: Four times a day (QID) | ORAL | Status: DC | PRN

## 2015-09-28 MED ORDER — ALLOPURINOL 100 MG PO TABS
100.0000 mg | ORAL_TABLET | Freq: Every day | ORAL | Status: DC
  Administered 2015-09-29 – 2015-10-02 (×4): 100 mg via ORAL
  Filled 2015-09-28 (×4): qty 1

## 2015-09-28 NOTE — ED Notes (Signed)
Patient transported to Ultrasound 

## 2015-09-28 NOTE — H&P (Signed)
[facility] [place] NAME: Johnny [name]    MR#:  [id number]  DATE OF BIRTH:  04/06/1951  DATE OF ADMISSION:  09/28/2015  PRIMARY CARE PHYSICIAN: [name], MD   REQUESTING/REFERRING PHYSICIAN: Joni Fears, MD  CHIEF COMPLAINT:   Chief Complaint  Patient presents with  . Fever    HISTORY OF PRESENT ILLNESS:  Johnny [name]  is a 65 y.o. male who presents with Chest pain. Patient states his pain has been ongoing for several days, steadily getting worse. It is central chest discomfort, it is exertional related. It is not radiating. It is not associated with nausea or vomiting, though he does state he may have had some episodes of diaphoresis. He is an end-stage renal disease patient on dialysis, with a known history of cardiac disease. On evaluation in the ED today he had some T-wave changes on his EKG, and a mildly elevated troponin 0.07. Given his symptoms and EKG changes he was started on IV heparin and hospitalists were called for admission.  PAST MEDICAL HISTORY:   Past Medical History  Diagnosis Date  . Hypertension   . Chronic kidney disease   . Gout   . Hyperlipidemia   . CHF (congestive heart failure) ([place])   . Anemia     PAST SURGICAL HISTORY:   Past Surgical History  Procedure Laterality Date  . Dialysis fistula creation    . Peripheral vascular catheterization Left 09/01/2015    Procedure: A/V Shuntogram/Fistulagram;  Surgeon: Katha Cabal, MD;  Location: [facility];  Service: Cardiovascular;  Laterality: Left;  . Av fistula placement Left 09/18/2015    Procedure: INSERTION OF ARTERIOVENOUS (AV) GORE-TEX GRAFT ARM ( BRACH/AXILLARY GRAFT W/ INSTANT STICK GRAFT );  Surgeon: Katha Cabal, MD;  Location: [facility];  Service: Vascular;  Laterality: Left;    SOCIAL HISTORY:   Social History  Substance Use Topics  . Smoking status: Former Research scientist (life sciences)  . Smokeless tobacco: Never Used  . Alcohol  Use: No    FAMILY HISTORY:   Family History  Problem Relation Age of Onset  . Hypertension    . Heart disease      DRUG ALLERGIES:   Allergies  Allergen Reactions  . Shellfish Allergy Anaphylaxis    MEDICATIONS AT HOME:   Prior to Admission medications   Medication Sig Start Date End Date Taking? Authorizing Provider  allopurinol (ZYLOPRIM) 100 MG tablet Take 100 mg by mouth daily.   Yes Historical Provider, MD  calcium acetate (PHOSLO) 667 MG capsule Take 1,334 mg by mouth 3 (three) times daily with meals.   Yes Historical Provider, MD  cinacalcet (SENSIPAR) 90 MG tablet Take 90 mg by mouth 2 (two) times daily.    Yes Historical Provider, MD  clonazePAM (KLONOPIN) 0.5 MG tablet Take 0.5 mg by mouth at bedtime.   Yes Historical Provider, MD  colchicine 0.6 MG tablet Take 0.6 mg by mouth daily as needed (for gout flares).    Yes Historical Provider, MD  folic acid-vitamin b complex-vitamin c-selenium-zinc (DIALYVITE) 3 MG TABS tablet Take 1 tablet by mouth every Monday, Wednesday, and Friday.   Yes Historical Provider, MD  furosemide (LASIX) 40 MG tablet Take 40 mg by mouth every Tuesday, Thursday, Saturday, and Sunday.    Yes Historical Provider, MD  hydrALAZINE (APRESOLINE) 25 MG tablet Take 25 mg by mouth 2 (two) times daily.    Yes Historical Provider, MD  metoprolol (LOPRESSOR) 50 MG tablet  Take 50 mg by mouth 2 (two) times daily.   Yes Historical Provider, MD  ramipril (ALTACE) 10 MG capsule Take 10 mg by mouth at bedtime.    Yes Historical Provider, MD  sevelamer carbonate (RENVELA) 800 MG tablet Take 2,400 mg by mouth 3 (three) times daily with meals.    Yes Historical Provider, MD  oxyCODONE-acetaminophen (ROXICET) 5-325 MG tablet Take 1-2 tablets by mouth every 6 (six) hours as needed for moderate pain or severe pain. Patient not taking: Reported on 09/28/2015 09/18/15   Katha Cabal, MD    REVIEW OF SYSTEMS:  Review of Systems  Constitutional: Negative for fever,  chills, weight loss and malaise/fatigue.  HENT: Negative for ear pain, hearing loss and tinnitus.   Eyes: Negative for blurred vision, double vision, pain and redness.  Respiratory: Positive for shortness of breath. Negative for cough and hemoptysis.   Cardiovascular: Positive for chest pain. Negative for palpitations, orthopnea and leg swelling.  Gastrointestinal: Negative for nausea, vomiting, abdominal pain, diarrhea and constipation.  Genitourinary: Negative for dysuria, frequency and hematuria.  Musculoskeletal: Negative for back pain, joint pain and neck pain.  Skin:       No acne, rash, or lesions  Neurological: Negative for dizziness, tremors, focal weakness and weakness.  Endo/Heme/Allergies: Negative for polydipsia. Does not bruise/bleed easily.  Psychiatric/Behavioral: Negative for depression. The patient is not nervous/anxious and does not have insomnia.      VITAL SIGNS:   Filed Vitals:   09/28/15 2030 09/28/15 2100 09/28/15 2115 09/28/15 2145  BP:  164/85    Pulse:  85 92 88  Temp:      TempSrc:      Resp: 17 19 21 20   Height:      Weight:      SpO2:  94% 98% 97%   Wt Readings from Last 3 Encounters:  09/28/15 88.451 kg (195 lb)  09/18/15 89.359 kg (197 lb)  09/15/15 89.359 kg (197 lb)    PHYSICAL EXAMINATION:  Physical Exam  Vitals reviewed. Constitutional: He is oriented to person, place, and time. He appears well-developed and well-nourished. No distress.  HENT:  Head: Normocephalic and atraumatic.  Mouth/Throat: Oropharynx is clear and moist.  Eyes: Conjunctivae and EOM are normal. Pupils are equal, round, and reactive to light. No scleral icterus.  Neck: Normal range of motion. Neck supple. No JVD present. No thyromegaly present.  Cardiovascular: Normal rate, regular rhythm and intact distal pulses.  Exam reveals no gallop and no friction rub.   No murmur heard. Respiratory: Effort normal and breath sounds normal. No respiratory distress. He has no  wheezes. He has no rales.  GI: Soft. Bowel sounds are normal. He exhibits no distension. There is no tenderness.  Musculoskeletal: Normal range of motion. He exhibits no edema.  No arthritis, no gout  Lymphadenopathy:    He has no cervical adenopathy.  Neurological: He is alert and oriented to person, place, and time. No cranial nerve deficit.  No dysarthria, no aphasia  Skin: Skin is warm and dry. No rash noted. No erythema.  Psychiatric: He has a normal mood and affect. His behavior is normal. Judgment and thought content normal.    LABORATORY PANEL:   CBC  Recent Labs Lab 09/28/15 1630  WBC 7.5  HGB 9.9*  HCT 31.0*  PLT 135*   ------------------------------------------------------------------------------------------------------------------  Chemistries   Recent Labs Lab 09/28/15 1630  NA 140  K 5.3*  CL 107  CO2 21*  GLUCOSE 69  BUN 26*  CREATININE 8.51*  CALCIUM 9.8   ------------------------------------------------------------------------------------------------------------------  Cardiac Enzymes  Recent Labs Lab 09/28/15 1630  TROPONINI 0.07*   ------------------------------------------------------------------------------------------------------------------  RADIOLOGY:  Dg Chest 2 View  09/28/2015  CLINICAL DATA:  Cough.  On dialysis. EXAM: CHEST  2 VIEW COMPARISON:  None. FINDINGS: Left jugular dual-lumen dialysis catheter in the upper right atrium in satisfactory position. Cardiac enlargement. Negative for edema or effusion. Negative for pneumonia. Left axillary stent IMPRESSION: Cardiac enlargement.  Negative for edema or pneumonia. Electronically Signed   By: Franchot Gallo M.D.   On: 09/28/2015 13:10   Ct Angio Chest Pe W/cm &/or Wo Cm  09/28/2015  CLINICAL DATA:  Intermittent cough with chest pain and fever. Hemodialysis patient. Evaluate for pulmonary embolism. EXAM: CT ANGIOGRAPHY CHEST WITH CONTRAST TECHNIQUE: Multidetector CT imaging of the  chest was performed using the standard protocol during bolus administration of intravenous contrast. Multiplanar CT image reconstructions and MIPs were obtained to evaluate the vascular anatomy. CONTRAST:  75 ml Isovue 370. COMPARISON:  Chest radiographs 09/28/2015.  Abdominal CT 11/14/2010. FINDINGS: Mediastinum: The pulmonary arteries are well opacified with contrast. There is no evidence of acute pulmonary embolism. There is atherosclerosis of the aorta, great vessels and coronary arteries.The heart is mildly enlarged. There is no pericardial effusion. Dialysis catheters extend to the lower SVC. There are no enlarged mediastinal, hilar or axillary lymph nodes. The thyroid gland, trachea and esophagus demonstrate no significant findings. Lungs/Pleura: There is no pleural effusion.Mild dependent atelectasis at both lung bases. There is no confluent airspace opacity, endobronchial lesion or suspicious pulmonary nodule. Upper abdomen: No suspicious findings within the visualized upper abdomen. 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 Bilateral gynecomastia noted. Review of the MIP images confirms the above findings. IMPRESSION: 1. No evidence of acute pulmonary embolism or other acute chest process. 2. Moderate atherosclerosis and mild cardiomegaly. 3. Mild dependent atelectasis at both lung bases. Electronically Signed   By: Richardean Sale M.D.   On: 09/28/2015 18:02    EKG:   Orders placed or performed during the hospital encounter of 09/28/15  . ED EKG  . ED EKG  . EKG 12-Lead  . EKG 12-Lead    IMPRESSION AND PLAN:  Principal Problem:   Unstable angina (HCC) - mildly elevated troponin, but this in setting of end-stage renal disease. However, given his typical symptoms, known history of cardiac disease, and EKG changes he is being treated for likely ACS. We will continue trending his cardiac enzymes  tonight. We'll get an echocardiogram and cardiology consult in the morning. Pain resolved with sublingual nitroglycerin, we'll keep this available when necessary. Active Problems:   ESRD on dialysis [facility]) - nephrology consult for dialysis support. Patient has a dialysis catheter in place right now given prior malfunction of his fistula.   Chronic diastolic CHF (congestive heart failure) (HCC) - continue home meds   HTN (hypertension) - currently elevated, continue home meds and we'll use when necessary additional antihypertensives with a goal blood pressure less than 160/100   Gout - continue home prophylactic meds  All the records are reviewed and case discussed with ED provider. Management plans discussed with the patient and/or family.  DVT PROPHYLAXIS: Systemic anticoagulation  GI PROPHYLAXIS: None  ADMISSION STATUS: Inpatient  CODE STATUS: Full Code Status History    This patient does not have a recorded code status. Please follow your organizational policy for patients in this situation.  TOTAL TIME TAKING CARE OF THIS PATIENT: 45 minutes.    Johnny [name] Alpha 09/28/2015, 9:59 PM  [facility]  Office  [phone]  CC: Primary care physician; [name], MD

## 2015-09-28 NOTE — Progress Notes (Signed)
ANTICOAGULATION CONSULT NOTE - Initial Consult  Pharmacy Consult for heparin drip Indication: chest pain/ACS  Allergies  Allergen Reactions  . Shellfish Allergy Anaphylaxis    Patient Measurements: Height: 5\' 8"  (172.7 cm) Weight: 195 lb (88.451 kg) IBW/kg (Calculated) : 68.4 Heparin Dosing Weight: 86 kg  Vital Signs: Temp: 98.8 F (37.1 C) (05/22 1929) Temp Source: Oral (05/22 1929) BP: 170/81 mmHg (05/22 1927) Pulse Rate: 81 (05/22 1927)  Labs:  Recent Labs  09/28/15 1630  HGB 9.9*  HCT 31.0*  PLT 135*  CREATININE 8.51*  TROPONINI 0.07*   Estimated Creatinine Clearance: 9.2 mL/min (by C-G formula based on Cr of 8.51).  Medical History: Past Medical History  Diagnosis Date  . Hypertension   . Chronic kidney disease   . Gout   . Hyperlipidemia   . CHF (congestive heart failure) ([place])   . Anemia    Assessment: Pharmacy consulted to dose and monitor heparin drip in this 65 year old male admitted with ACS/chest pain. Patient was not taking anticoagulants prior to admission. Patient is on HD.  Baseline labs: Hgb 9.9 Plt 135  Troponin 0.07  INR & APTT ordered   Goal of Therapy:  Heparin level 0.3-0.7 units/ml Monitor platelets by anticoagulation protocol: Yes   Plan:  Give 3000 units bolus x 1  Start heparin infusion at 1100 units/hr (~13 units/kg/hr) Check anti-Xa level in 8 hours and daily while on heparin Continue to monitor H&H and platelets  Lenis Noon, PharmD Clinical Pharmacist 09/28/2015,8:19 PM

## 2015-09-28 NOTE — ED Notes (Signed)
Says went to dialysis and has fever.  Has had recetn cough that comes and goes.  Also had a fistulagram on 5/12 and there was swelling of area--left arm.  They put in temporary dialysis cath until the fistula heals.

## 2015-09-28 NOTE — ED Provider Notes (Signed)
[facility] Emergency Department Provider Note  ____________________________________________  Time seen: 4:40 PM  I have reviewed the triage vital signs and the nursing notes.   HISTORY  Chief Complaint Fever    HPI Johnny Brilla. is a 65 y.o. male brought to the ED due to chest pain. The chest pain started about a week to 10 days ago after he had a fistulogram of his left upper extremity due to decreased flow and inability to dialyze out of it. He had a tunneled left IJ catheter placed for dialysis access for now. Since then is having sharp chest pain which is constant, pleuritic, nonradiating. Hurts to cough and breathe. Also feel short of breath.      Past Medical History  Diagnosis Date  . Hypertension   . Chronic kidney disease   . Gout   . Hyperlipidemia   . CHF (congestive heart failure) ([place])   . Anemia      There are no active problems to display for this patient.    Past Surgical History  Procedure Laterality Date  . Dialysis fistula creation    . Peripheral vascular catheterization Left 09/01/2015    Procedure: A/V Shuntogram/Fistulagram;  Surgeon: Katha Cabal, MD;  Location: [place] [facility];  Service: Cardiovascular;  Laterality: Left;  . Av fistula placement Left 09/18/2015    Procedure: INSERTION OF ARTERIOVENOUS (AV) GORE-TEX GRAFT ARM ( BRACH/AXILLARY GRAFT W/ INSTANT STICK GRAFT );  Surgeon: Katha Cabal, MD;  Location: [facility] ORS;  Service: Vascular;  Laterality: Left;     Current Outpatient Rx  Name  Route  Sig  Dispense  Refill  . allopurinol (ZYLOPRIM) 100 MG tablet   Oral   Take 100 mg by mouth daily.         . calcium acetate (PHOSLO) 667 MG capsule   Oral   Take 1,334 mg by mouth 3 (three) times daily with meals.         . cinacalcet (SENSIPAR) 90 MG tablet   Oral   Take 90 mg by mouth 2 (two) times daily.          . clonazePAM (KLONOPIN) 0.5 MG tablet   Oral   Take 0.5 mg by mouth at  bedtime.         . colchicine 0.6 MG tablet   Oral   Take 0.6 mg by mouth daily as needed (for gout flares).          . folic acid-vitamin b complex-vitamin c-selenium-zinc (DIALYVITE) 3 MG TABS tablet   Oral   Take 1 tablet by mouth every Monday, Wednesday, and Friday.         . furosemide (LASIX) 40 MG tablet   Oral   Take 40 mg by mouth every Tuesday, Thursday, Saturday, and Sunday.          . hydrALAZINE (APRESOLINE) 25 MG tablet   Oral   Take 25 mg by mouth 2 (two) times daily.          . metoprolol (LOPRESSOR) 50 MG tablet   Oral   Take 50 mg by mouth 2 (two) times daily.         . ramipril (ALTACE) 10 MG capsule   Oral   Take 10 mg by mouth at bedtime.          . sevelamer carbonate (RENVELA) 800 MG tablet   Oral   Take 2,400 mg by mouth 3 (three) times daily with meals.          [name]  oxyCODONE-acetaminophen (ROXICET) 5-325 MG tablet   Oral   Take 1-2 tablets by mouth every 6 (six) hours as needed for moderate pain or severe pain. Patient not taking: Reported on 09/28/2015   50 tablet   0      Allergies Shellfish allergy   No family history on file.  Social History Social History  Substance Use Topics  . Smoking status: Former Research scientist (life sciences)  . Smokeless tobacco: Never Used  . Alcohol Use: No    Review of Systems  Constitutional:   No fever or chills.  Eyes:   No vision changes.  ENT:   No sore throat. No rhinorrhea. Cardiovascular:   Positive chest pain. Respiratory:   Positive shortness of breath, no cough. Gastrointestinal:   Negative for abdominal pain, vomiting and diarrhea.  Genitourinary:   Negative for dysuria or difficulty urinating. Musculoskeletal:   Left arm swelling and pain Neurological:   Negative for headaches 10-point ROS otherwise negative.  ____________________________________________   PHYSICAL EXAM:  VITAL SIGNS: ED Triage Vitals  Enc Vitals Group     BP 09/28/15 1159 183/87 mmHg     Pulse Rate 09/28/15 1159  80     Resp 09/28/15 1159 18     Temp 09/28/15 1159 98.7 F (37.1 C)     Temp Source 09/28/15 1159 Oral     SpO2 09/28/15 1159 100 %     Weight 09/28/15 1159 195 lb (88.451 kg)     Height 09/28/15 1159 5\' 8"  (1.727 m)     Head Cir --      Peak Flow --      Pain Score 09/28/15 1217 3     Pain Loc --      Pain Edu? --      Excl. in [place]? --     Vital signs reviewed, nursing assessments reviewed.   Constitutional:   Alert and oriented. Well appearing and in no distress. Eyes:   No scleral icterus. No conjunctival pallor. PERRL. EOMI.  No nystagmus. ENT   Head:   Normocephalic and atraumatic.   Nose:   No congestion/rhinnorhea. No septal hematoma   Mouth/Throat:   MMM, no pharyngeal erythema. No peritonsillar mass.    Neck:   No stridor. No SubQ emphysema. No meningismus. Hematological/Lymphatic/Immunilogical:   No cervical lymphadenopathy. Cardiovascular:   RRR. Symmetric bilateral radial and DP pulses.  No murmurs.  Respiratory:   Normal work of breathing. Tachypnea.. Breath sounds are clear and equal bilaterally. No wheezes/rales/rhonchi. Gastrointestinal:   Soft and nontender. Non distended. There is no CVA tenderness.  No rebound, rigidity, or guarding. Genitourinary:   deferred Musculoskeletal:   Diffuse swelling of the left upper extremity. There is a weak thrill in the AV fistula in the upper left arm. There is a positive radial pulse and the hand is warm. Compartments are soft. Other extremities unremarkable. Full range of motion. Neurologic:   Normal speech and language.  CN 2-10 normal. Motor grossly intact. No gross focal neurologic deficits are appreciated.  Skin:    Skin is warm, dry and intact. There is a tunneled left internal jugular dialysis catheter in the left upper chest. The skin penetration site and subcutaneous tissue areas are noninflamed. There is no purulent drainage. Nontender. No rash noted.  No petechiae, purpura, or  bullae.  ____________________________________________    LABS (pertinent positives/negatives) (all labs ordered are listed, but only abnormal results are displayed) Labs Reviewed  BASIC METABOLIC PANEL - Abnormal; Notable for the following:  Potassium 5.3 (*)    CO2 21 (*)    BUN 26 (*)    Creatinine, Ser 8.51 (*)    GFR calc non Af Amer 6 (*)    GFR calc Af Amer 7 (*)    All other components within normal limits  CBC WITH DIFFERENTIAL/PLATELET - Abnormal; Notable for the following:    RBC 3.56 (*)    Hemoglobin 9.9 (*)    HCT 31.0 (*)    MCHC 31.8 (*)    RDW 16.6 (*)    Platelets 135 (*)    Lymphs Abs 0.9 (*)    All other components within normal limits  TROPONIN I - Abnormal; Notable for the following:    Troponin I 0.07 (*)    All other components within normal limits  CBC WITH DIFFERENTIAL/PLATELET  APTT  PROTIME-INR   ____________________________________________   EKG  Interpreted by me Normal sinus rhythm rate of 83, right axis, normal intervals. Poor R-wave progression in anterior precordial leads. Normal ST segments. T wave inversions in 1 and aVL. The T-wave inversions are new compared to 09/15/2015.  ____________________________________________    RADIOLOGY  CT angiogram of the chest unremarkable  ____________________________________________   PROCEDURES   ____________________________________________   INITIAL IMPRESSION / ASSESSMENT AND PLAN / ED COURSE  Pertinent labs & imaging results that were available during my care of the patient were reviewed by me and considered in my medical decision making (see chart for details).  Patient well appearing no acute distress, but presents with pleuritic chest pain after recent evaluation of malfunctioning AV fistula in the left arm. He also swelling in the area concerning for DVT. CT angiogram of the chest was performed which was negative for PE. Troponin was elevated at 0.07. No prior recent troponin  to compare to. EKG shows new T-wave inversions in high lateral leads, new compared to 2 weeks ago. Given this, I recommended admission and the patient is agreeable. I'll start him on heparin and give aspirin for possible non-ST elevation MI. Chest pain was resolved with sublingual nitroglycerin. Case discussed with hospitalist.     ____________________________________________   FINAL CLINICAL IMPRESSION(S) / ED DIAGNOSES  Final diagnoses:  NSTEMI (non-ST elevated myocardial infarction) ([place])  Chest pain, unspecified chest pain type       Portions of this note were generated with dragon dictation software. Dictation errors may occur despite best attempts at proofreading.   Carrie Mew, MD 09/28/15 2024

## 2015-09-29 ENCOUNTER — Inpatient Hospital Stay
Admit: 2015-09-29 | Discharge: 2015-09-29 | Disposition: A | Discharge status: Home / Self Care | Payer: Medicare Other | Attending: Internal Medicine | Admitting: Internal Medicine

## 2015-09-29 ENCOUNTER — Encounter
Admission: EM | Disposition: A | Discharge status: Home / Self Care | Payer: Self-pay | Source: Home / Self Care | Attending: Internal Medicine

## 2015-09-29 ENCOUNTER — Inpatient Hospital Stay: Admit: 2015-09-29 | Payer: Medicare Other

## 2015-09-29 LAB — BASIC METABOLIC PANEL
ANION GAP: 10 (ref 5–15)
BUN: 40 mg/dL — ABNORMAL HIGH (ref 6–20)
CO2: 22 mmol/L (ref 22–32)
CREATININE: 9.81 mg/dL — AB (ref 0.61–1.24)
Calcium: 9.7 mg/dL (ref 8.9–10.3)
Chloride: 105 mmol/L (ref 101–111)
GFR calc non Af Amer: 5 mL/min — ABNORMAL LOW (ref >60)
GFR, EST AFRICAN AMERICAN: 6 mL/min — AB (ref >60)
GLUCOSE: 148 mg/dL — AB (ref 65–99)
Potassium: 5 mmol/L (ref 3.5–5.1)
Sodium: 137 mmol/L (ref 135–145)

## 2015-09-29 LAB — CBC
HEMATOCRIT: 27.9 % — AB (ref 40.0–52.0)
HEMOGLOBIN: 8.9 g/dL — AB (ref 13.0–18.0)
MCH: 28.3 pg (ref 26.0–34.0)
MCHC: 32.1 g/dL (ref 32.0–36.0)
MCV: 88.2 fL (ref 80.0–100.0)
Platelets: 140 10*3/uL — ABNORMAL LOW (ref 150–440)
RBC: 3.16 MIL/uL — AB (ref 4.40–5.90)
RDW: 16.7 % — ABNORMAL HIGH (ref 11.5–14.5)
WBC: 8 10*3/uL (ref 3.8–10.6)

## 2015-09-29 LAB — HEPARIN LEVEL (UNFRACTIONATED): Heparin Unfractionated: 0.42 IU/mL (ref 0.30–0.70)

## 2015-09-29 LAB — TROPONIN I
TROPONIN I: 0.05 ng/mL — AB (ref <0.031)
TROPONIN I: 0.06 ng/mL — AB (ref <0.031)
TROPONIN I: 0.06 ng/mL — AB (ref <0.031)

## 2015-09-29 SURGERY — DIALYSIS/PERMA CATHETER REMOVAL
Anesthesia: LOCAL

## 2015-09-29 MED ORDER — VANCOMYCIN HCL IN DEXTROSE 1-5 GM/200ML-% IV SOLN
1000.0000 mg | Freq: Once | INTRAVENOUS | Status: AC
  Administered 2015-09-29: 1000 mg via INTRAVENOUS
  Filled 2015-09-29: qty 200

## 2015-09-29 MED ORDER — COLCHICINE 0.6 MG PO TABS
0.6000 mg | ORAL_TABLET | ORAL | Status: AC
  Administered 2015-09-29: 0.6 mg via ORAL
  Filled 2015-09-29: qty 1

## 2015-09-29 MED ORDER — OXYCODONE-ACETAMINOPHEN 5-325 MG PO TABS: 1.0000 | ORAL_TABLET | Freq: Four times a day (QID) | ORAL | Status: DC | PRN

## 2015-09-29 MED ORDER — VANCOMYCIN HCL IN DEXTROSE 750-5 MG/150ML-% IV SOLN
750.0000 mg | INTRAVENOUS | Status: DC
  Administered 2015-09-30 – 2015-10-02 (×2): 750 mg via INTRAVENOUS
  Filled 2015-09-29 (×4): qty 150

## 2015-09-29 MED ORDER — HYDROCODONE-ACETAMINOPHEN 5-325 MG PO TABS: 1.0000 | ORAL_TABLET | ORAL | Status: DC | PRN

## 2015-09-29 MED ORDER — SODIUM CHLORIDE 0.9 % IV SOLN: INTRAVENOUS | Status: DC

## 2015-09-29 MED ORDER — VANCOMYCIN HCL 10 G IV SOLR
2000.0000 mg | Freq: Once | INTRAVENOUS | Status: DC
  Filled 2015-09-29: qty 2000

## 2015-09-29 MED ORDER — HEPARIN SODIUM (PORCINE) 5000 UNIT/ML IJ SOLN
5000.0000 [IU] | Freq: Three times a day (TID) | INTRAMUSCULAR | Status: DC
  Administered 2015-09-29 – 2015-10-02 (×7): 5000 [IU] via SUBCUTANEOUS
  Filled 2015-09-29 (×7): qty 1

## 2015-09-29 MED ORDER — CHLORHEXIDINE GLUCONATE CLOTH 2 % EX PADS
6.0000 | MEDICATED_PAD | Freq: Once | CUTANEOUS | Status: AC
  Administered 2015-09-29: 6 via TOPICAL

## 2015-09-29 NOTE — Progress Notes (Signed)
A & O. Ambulated around the nurses station and tolerated it well. Room air. NSR. Pt reports no pain. Takes meds ok. Heparin gtt was d/c. Possible permcath removal 5/24. Pt has no further concerns at this time.

## 2015-09-29 NOTE — Consult Note (Signed)
[place]  CARDIOLOGY CONSULT NOTE  Patient ID: Johnny [name]. MRN: [id number] DOB/AGE: 01-16-1951 65 y.o.  Admit date: 09/28/2015 Referring Physician Dr. Tressia Miners Primary Physician  DR. Harriett Burns Primary Cardiologist Dr. 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 Circumflex has 25% stenosis. Mid RCA has 25% stenosis who was admitted with complaints of chest pain. Patient described as occurring for several days. He stated was worse when breathing deeply. He had a Port-A-Cath placed in his left chest and symptoms worsened at that time. He has some minimal troponin elevation of 0.07. He has no further chest pain. Electrocautery gram showed no ischemic changes. Patient had some chest pain during dialysis. He dilates on Monday Wednesday and Friday. Pain was nonexertional. Functional study done less than a year [facility] revealed ejection fraction of 58% with no evidence of ischemia. Patient is compliant with his medications including Klonopin, aspirin, furosemide, labetalol, hydralazine, ramipril. Patient was placed on IV heparin overnight. His troponins remained unremarkable secondary to renal insufficiency.  Review of Systems  Constitutional: Negative.   HENT: Negative.   Eyes: Negative.   Respiratory: Positive for shortness of breath.   Cardiovascular: Positive for chest pain.  Gastrointestinal: Negative.   Genitourinary: Negative.   Musculoskeletal: Positive for myalgias.  Skin: Negative.   Neurological: Negative.   Endo/Heme/Allergies: Negative.   Psychiatric/Behavioral: Negative.     Past Medical History  Diagnosis Date  . Hypertension   . Chronic kidney  disease   . Gout   . Hyperlipidemia   . CHF (congestive heart failure) ([place])   . Anemia     Family History  Problem Relation Age of Onset  . Hypertension    . Heart disease      Social History   Social History  . Marital Status: Single    Spouse Name: N/A  . Number of Children: N/A  . Years of Education: N/A   Occupational History  . Not on file.   Social History Main Topics  . Smoking status: Former Research scientist (life sciences)  . Smokeless tobacco: Never Used  . Alcohol Use: No  . Drug Use: No  . Sexual Activity: Not on file   Other Topics Concern  . Not on file   Social History Narrative    Past Surgical History  Procedure Laterality Date  . Dialysis fistula creation    . Peripheral vascular catheterization Left 09/01/2015    Procedure: A/V Shuntogram/Fistulagram;  Surgeon: Katha Cabal, MD;  Location: [place] [facility];  Service: Cardiovascular;  Laterality: Left;  . Av fistula placement Left 09/18/2015    Procedure: INSERTION OF ARTERIOVENOUS (AV) GORE-TEX GRAFT ARM ( BRACH/AXILLARY GRAFT W/ INSTANT STICK GRAFT );  Surgeon: Katha Cabal, MD;  Location: [facility] ORS;  Service: Vascular;  Laterality: Left;     Prescriptions prior to admission  Medication Sig Dispense Refill Last Dose  . allopurinol (ZYLOPRIM) 100 MG tablet Take 100 mg by mouth daily.   09/28/2015 at Unknown time  . calcium acetate (PHOSLO) 667 MG capsule Take 1,334 mg by mouth 3 (three) times daily with meals.   09/28/2015 at Unknown time  . cinacalcet (SENSIPAR) 90 MG tablet Take 90 mg by mouth 2 (two) times daily.    09/28/2015 at  Unknown time  . clonazePAM (KLONOPIN) 0.5 MG tablet Take 0.5 mg by mouth at bedtime.   09/27/2015 at Unknown time  . colchicine 0.6 MG tablet Take 0.6 mg by mouth daily as needed (for gout flares).    09/27/2015 at Unknown time  . folic acid-vitamin b complex-vitamin c-selenium-zinc (DIALYVITE) 3 MG TABS tablet Take 1 tablet by mouth every Monday, Wednesday, and Friday.   09/28/2015 at  Unknown time  . furosemide (LASIX) 40 MG tablet Take 40 mg by mouth every Tuesday, Thursday, Saturday, and Sunday.    09/27/2015 at Unknown time  . hydrALAZINE (APRESOLINE) 25 MG tablet Take 25 mg by mouth 2 (two) times daily.    09/28/2015 at Unknown time  . metoprolol (LOPRESSOR) 50 MG tablet Take 50 mg by mouth 2 (two) times daily.   09/28/2015 at 0430  . ramipril (ALTACE) 10 MG capsule Take 10 mg by mouth at bedtime.    09/27/2015 at Unknown time  . sevelamer carbonate (RENVELA) 800 MG tablet Take 2,400 mg by mouth 3 (three) times daily with meals.    09/28/2015 at Unknown time  . oxyCODONE-acetaminophen (ROXICET) 5-325 MG tablet Take 1-2 tablets by mouth every 6 (six) hours as needed for moderate pain or severe pain. (Patient not taking: Reported on 09/28/2015) 50 tablet 0     Physical Exam: Blood pressure 131/54, pulse 62, temperature 98 F (36.7 C), temperature source Oral, resp. rate 20, height 5\' 8"  (1.727 m), weight 89.812 kg (198 lb), SpO2 96 %.   Wt Readings from Last 1 Encounters:  09/29/15 89.812 kg (198 lb)     General appearance: alert and cooperative Resp: clear to auscultation bilaterally Chest wall: left sided chest wall tenderness Cardio: regular rate and rhythm GI: soft, non-tender; bowel sounds normal; no masses,  no organomegaly Extremities: extremities normal, atraumatic, no cyanosis or edema Neurologic: Grossly normal  Labs:   Lab Results  Component Value Date   WBC 8.0 09/29/2015   HGB 8.9* 09/29/2015   HCT 27.9* 09/29/2015   MCV 88.2 09/29/2015   PLT 140* 09/29/2015    Recent Labs Lab 09/29/15 0448  NA 137  K 5.0  CL 105  CO2 22  BUN 40*  CREATININE 9.81*  CALCIUM 9.7  GLUCOSE 148*   Lab Results  Component Value Date   CKTOTAL 77 06/20/2011   CKMB 0.9 06/20/2011   TROPONINI 0.05* 09/29/2015      Radiology: Chest x-ray showed cardiac enlargement with no edema or pneumonia. Chest CT revealed no pulmonary embolism or other acute chest process.  There was mild cardiomegaly. No evidence of deep vein thrombosis on left upper extremity ultrasound. EKG: EKG revealed ectopic atrial rhythm with no obvious ischemia.  ASSESSMENT AND PLAN:  Patient is a 65 year old male with history of insignificant coronary disease by cardiac catheterization lab showing less than 25% stenosis in the right coronary artery and left circumflex. He has a history of end-stage renal disease on hemodialysis. He had a functional study done less than one year ago showing anemia with EF of 58%. He had a reported cardiomyopathy with an EF of 35% previously however this appears to have improved. Chest pain was atypical and had a pleuritic component. Chest CT was negative for pulmonary embolus. He recently had a procedure in his left chest which did not result in any deep vein thromboses by ultrasound. His pain appears to be atypical ischemia. His elevated troponin 0.07. Be secondary to renal insufficiency. Will discontinue heparin and ambulate the patient  on current medical regimen. Would agree with treatment with colchicine for possible inflammatory chest wall disease, pericarditis or costochondritis. We continue with metoprolol at 50 mg twice daily, hydralazine 25 mg twice daily. If stable would consider discharge with outpatient follow-up per Dr. Towanda Malkin. Signed: Teodoro Spray MD, [facility] 09/29/2015, 8:43 AM

## 2015-09-29 NOTE — Progress Notes (Signed)
ANTICOAGULATION CONSULT NOTE - Initial Consult  Pharmacy Consult for heparin drip Indication: chest pain/ACS  Allergies  Allergen Reactions  . Shellfish Allergy Anaphylaxis    Patient Measurements: Height: 5\' 8"  (172.7 cm) Weight: 199 lb (90.266 kg) IBW/kg (Calculated) : 68.4 Heparin Dosing Weight: 86 kg  Vital Signs: Temp: 98 F (36.7 C) (05/23 0431) Temp Source: Oral (05/22 2307) BP: 116/73 mmHg (05/23 0431) Pulse Rate: 70 (05/23 0431)  Labs:  Recent Labs  09/28/15 1630 09/28/15 2344 09/29/15 0448 09/29/15 0450  HGB 9.9*  --   --  8.9*  HCT 31.0*  --   --  27.9*  PLT 135*  --   --  140*  APTT 38*  --   --   --   LABPROT 15.2*  --   --   --   INR 1.18  --   --   --   HEPARINUNFRC  --   --  0.42  --   CREATININE 8.51*  --   --   --   TROPONINI 0.07* 0.06*  --   --    Estimated Creatinine Clearance: 9.3 mL/min (by C-G formula based on Cr of 8.51).  Medical History: Past Medical History  Diagnosis Date  . Hypertension   . Chronic kidney disease   . Gout   . Hyperlipidemia   . CHF (congestive heart failure) ([place])   . Anemia    Assessment: Pharmacy consulted to dose and monitor heparin drip in this 65 year old male admitted with ACS/chest pain. Patient was not taking anticoagulants prior to admission. Patient is on HD.  Baseline labs: Hgb 9.9 Plt 135  Troponin 0.07  INR & APTT ordered   Goal of Therapy:  Heparin level 0.3-0.7 units/ml Monitor platelets by anticoagulation protocol: Yes   Plan:  First heparin level therapeutic. Continue current rate. Will recheck heparin level in 8 hours.  Laural Benes, Pharm.D., BCPS Clinical Pharmacist 09/29/2015,5:34 AM

## 2015-09-29 NOTE — Progress Notes (Signed)
Patient arrived to 2A Room 246. Patient denies pain and all questions answered. Patient oriented to unit and Fall Safety Plan signed. Skin assessment completed with Lexi RN and skin intact. A&Ox4, VSS, and NSR on verified tele box #[id number]. Nursing staff will continue to monitor. Earleen Reaper, RN

## 2015-09-29 NOTE — Consult Note (Signed)
Pharmacy Antibiotic Note  Johnny [name]. is a 65 y.o. male admitted on 09/28/2015 with fever during HD.  Pharmacy has been consulted for vancomycin dosing.  Plan: pt recieved 1g of vancomycin yesterday at HD according to nephrology. Will give additional g today for a total of 2g load. Will then give 750mg  after HD on MWF Will draw level level prior to the third HD session on 5/29 Height: 5\' 8"  (172.7 cm) Weight: 198 lb (89.812 kg) IBW/kg (Calculated) : 68.4  Temp (24hrs), Avg:98.1 F (36.7 C), Min:97.6 F (36.4 C), Max:98.8 F (37.1 C)   Recent Labs Lab 09/28/15 1630 09/29/15 0448 09/29/15 0450  WBC 7.5  --  8.0  CREATININE 8.51* 9.81*  --     Estimated Creatinine Clearance: 8.1 mL/min (by C-G formula based on Cr of 9.81).    Allergies  Allergen Reactions  . Shellfish Allergy Anaphylaxis    Antimicrobials this admission: vancomycin 5/22 >>    Dose adjustments this admission:   Microbiology results: 5/23 BCx: pending  5/9 MRSA PCR: positive  Thank you for allowing pharmacy to be a part of this patient's care.  Ramond Dial, Pharm.D Clinical Pharmacist   09/29/2015 2:17 PM

## 2015-09-29 NOTE — Progress Notes (Signed)
[facility] [place] NAME: Johnny [name]    MR#:  [id number]  DATE OF BIRTH:  July 13, 1950  SUBJECTIVE:  CHIEF COMPLAINT:   Chief Complaint  Patient presents with  . Fever   -Dialysis patient sent in after dialysis for fever and chills, also having chest pain. -Pleuritic chest pain has resolved. Troponins are stable. Heparin drip discontinued. -Blood cultures pending. No further fevers here  REVIEW OF SYSTEMS:  Review of Systems  Constitutional: Positive for fever and chills.  HENT: Negative for ear discharge, ear pain and nosebleeds.   Eyes: Negative for blurred vision and double vision.  Respiratory: Negative for cough, shortness of breath and wheezing.   Cardiovascular: Positive for chest pain. Negative for palpitations and leg swelling.       Chest pain is resolved  Gastrointestinal: Negative for nausea, vomiting, abdominal pain, diarrhea and constipation.  Genitourinary: Negative for dysuria and urgency.  Musculoskeletal: Negative for myalgias, back pain and joint pain.  Neurological: Negative for dizziness, sensory change, speech change, focal weakness, seizures and headaches.  Psychiatric/Behavioral: Negative for depression.    DRUG ALLERGIES:   Allergies  Allergen Reactions  . Shellfish Allergy Anaphylaxis    VITALS:  Blood pressure 136/69, pulse 73, temperature 97.6 F (36.4 C), temperature source Oral, resp. rate 19, height 5\' 8"  (1.727 m), weight 89.812 kg (198 lb), SpO2 97 %.  PHYSICAL EXAMINATION:  Physical Exam  GENERAL:  65 y.o.-year-old patient lying in the bed with no acute distress.  EYES: Pupils equal, round, reactive to light and accommodation. No scleral icterus. Extraocular muscles intact.  HEENT: Head atraumatic, normocephalic. Oropharynx and nasopharynx clear.  NECK:  Supple, no jugular venous distention. No thyroid enlargement, no tenderness.  LUNGS: Normal breath sounds bilaterally, no  wheezing, rales,rhonchi or crepitation. No use of accessory muscles of respiration.  CARDIOVASCULAR: S1, S2 normal. No murmurs, rubs, or gallops.  Patient has a left chest permacath present. ABDOMEN: Soft, nontender, nondistended. Bowel sounds present. No organomegaly or mass.  EXTREMITIES: No pedal edema, cyanosis, or clubbing.  Right forearm has AV fistula with palpable thrill. Some swelling around the fistula noted. No tenderness. NEUROLOGIC: Cranial nerves II through XII are intact. Muscle strength 5/5 in all extremities. Sensation intact. Gait not checked.  PSYCHIATRIC: The patient is alert and oriented x 3.  SKIN: No obvious rash, lesion, or ulcer.    LABORATORY PANEL:   CBC  Recent Labs Lab 09/29/15 0450  WBC 8.0  HGB 8.9*  HCT 27.9*  PLT 140*   ------------------------------------------------------------------------------------------------------------------  Chemistries   Recent Labs Lab 09/29/15 0448  NA 137  K 5.0  CL 105  CO2 22  GLUCOSE 148*  BUN 40*  CREATININE 9.81*  CALCIUM 9.7   ------------------------------------------------------------------------------------------------------------------  Cardiac Enzymes  Recent Labs Lab 09/29/15 1118  TROPONINI 0.06*   ------------------------------------------------------------------------------------------------------------------  RADIOLOGY:  Dg Chest 2 View  09/28/2015  CLINICAL DATA:  Cough.  On dialysis. EXAM: CHEST  2 VIEW COMPARISON:  None. FINDINGS: Left jugular dual-lumen dialysis catheter in the upper right atrium in satisfactory position. Cardiac enlargement. Negative for edema or effusion. Negative for pneumonia. Left axillary stent IMPRESSION: Cardiac enlargement.  Negative for edema or pneumonia. Electronically Signed   By: Franchot Gallo M.D.   On: 09/28/2015 13:10   Ct Angio Chest Pe W/cm &/or Wo Cm  09/28/2015  CLINICAL DATA:  Intermittent cough with chest pain and fever. Hemodialysis  patient. Evaluate for pulmonary embolism. EXAM: CT ANGIOGRAPHY CHEST WITH  CONTRAST TECHNIQUE: Multidetector CT imaging of the chest was performed using the standard protocol during bolus administration of intravenous contrast. Multiplanar CT image reconstructions and MIPs were obtained to evaluate the vascular anatomy. CONTRAST:  75 ml Isovue 370. COMPARISON:  Chest radiographs 09/28/2015.  Abdominal CT 11/14/2010. FINDINGS: Mediastinum: The pulmonary arteries are well opacified with contrast. There is no evidence of acute pulmonary embolism. There is atherosclerosis of the aorta, great vessels and coronary arteries.The heart is mildly enlarged. There is no pericardial effusion. Dialysis catheters extend to the lower SVC. There are no enlarged mediastinal, hilar or axillary lymph nodes. The thyroid gland, trachea and esophagus demonstrate no significant findings. Lungs/Pleura: There is no pleural effusion.Mild dependent atelectasis at both lung bases. There is no confluent airspace opacity, endobronchial lesion or suspicious pulmonary nodule. Upper abdomen: No suspicious findings within the visualized upper abdomen. 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 Bilateral gynecomastia noted. Review of the MIP images confirms the above findings. IMPRESSION: 1. No evidence of acute pulmonary embolism or other acute chest process. 2. Moderate atherosclerosis and mild cardiomegaly. 3. Mild dependent atelectasis at both lung bases. Electronically Signed   By: Richardean Sale M.D.   On: 09/28/2015 18:02   US Venous Img Upper Uni Left  09/28/2015  CLINICAL DATA:  Left upper extremity pain and swelling 10 days with fever. Fistulogram 09/18/2015. Patient on heparin. Diabetic. EXAM: LEFT UPPER EXTREMITY VENOUS DOPPLER ULTRASOUND TECHNIQUE: Gray-scale sonography with graded compression, as well as color Doppler and duplex  ultrasound were performed to evaluate the upper extremity deep venous system from the level of the subclavian vein and including the jugular, axillary, basilic, radial, ulnar and upper cephalic vein. Spectral Doppler was utilized to evaluate flow at rest and with distal augmentation maneuvers. COMPARISON:  None. FINDINGS: Examination somewhat difficult due to patient's restlessness as well as scarring from prior dialysis graft. Patient has tunnel left IJ catheter for dialysis. Contralateral Subclavian Vein: Respiratory phasicity is normal and symmetric with the symptomatic side. No evidence of thrombus. Normal compressibility. Internal Jugular Vein: No evidence of thrombus. Normal compressibility, respiratory phasicity and response to augmentation. Subclavian Vein: No evidence of thrombus. Normal compressibility, respiratory phasicity and response to augmentation. Axillary Vein: No evidence of thrombus. Normal compressibility, respiratory phasicity and response to augmentation. Cephalic Vein: No evidence of thrombus. Normal compressibility, respiratory phasicity and response to augmentation. Basilic Vein: No evidence of thrombus. Normal compressibility, respiratory phasicity and response to augmentation. Brachial Veins: No evidence of thrombus. Normal compressibility, respiratory phasicity and response to augmentation. Radial Veins: No evidence of thrombus. Normal compressibility, respiratory phasicity and response to augmentation. Ulnar Veins: No evidence of thrombus. Normal compressibility, respiratory phasicity and response to augmentation. Venous Reflux:  None visualized. Other Findings:  None visualized. IMPRESSION: No evidence of deep venous thrombosis. Electronically Signed   By: Marin Olp M.D.   On: 09/28/2015 23:02    EKG:   Orders placed or performed during the hospital encounter of 09/28/15  . ED EKG  . ED EKG  . EKG 12-Lead  . EKG 12-Lead    ASSESSMENT AND PLAN:   65 year old male with  past medical history significant for end-stage renal disease on Monday Wednesday Friday hemodialysis, known ischemic cardiomyopathy with EF of [id number], diastolic CHF, hypertension sent in from dialysis for chest pain and also fever.  #1 chest pain-likely musculoskeletal chest pain. Ruled out for MI -Discontinue heparin drip. Last stress test was in August  2016 which was negative. -Appreciate cardiology consult. Chest pain has resolved at this time. Continue colchicine when necessary for any inflammatory myocarditis  #2 fever-patient had a recent procedure done for declotting his AV fistula. Some swelling is present on the left arm. -No further fevers in the hospital. -Blood cultures were drawn yesterday from dialysis center. Repeat blood cultures today ordered. -Vascular consult for this same. Received a dose of vancomycin yesterday with dialysis.  #3 end-stage renal disease on Monday Wednesday Friday hemodialysis-last dialysis yesterday. Patient is stable. -Nephrology consulted. Due for dialysis tomorrow per schedule  #4 hypertension-continue home medications. Patient on metoprolol, Altace, hydralazine and Lasix  #5 DVT prophylaxis-start subcutaneous heparin    All the records are reviewed and case discussed with Care Management/Social Workerr. Management plans discussed with the patient, family and they are in agreement.  CODE STATUS: Full code  TOTAL TIME TAKING CARE OF THIS PATIENT: 37 minutes.   POSSIBLE D/C IN 1-2 DAYS, DEPENDING ON CLINICAL CONDITION.   Gladstone Lighter M.D on 09/29/2015 at 1:29 PM  Between 7am to 6pm - Pager - [phone]  After 6pm go to [email or web address] - password [email or web address] [facility]  Office  [phone]  CC: Primary care physician; [name], MD

## 2015-09-29 NOTE — Progress Notes (Signed)
[facility]  ROUNDING NOTE   Subjective:  Patient well known to [name] as an outpatient. He had recent left upper extremity quick stick graft placed. He also has a left internal jugular PermCath. He was sent over for dialysis for fever and chills. He also was having some left-sided chest pain and CT chest PE protocol was negative.  Objective:  Vital signs in last 24 hours:  Temp:  [97.6 F (36.4 C)-98.8 F (37.1 C)] 97.6 F (36.4 C) (05/23 1144) Pulse Rate:  [62-92] 73 (05/23 1144) Resp:  [15-28] 19 (05/23 1144) BP: (116-215)/(54-90) 136/69 mmHg (05/23 1144) SpO2:  [94 %-100 %] 97 % (05/23 1144) Weight:  [89.812 kg (198 lb)-90.266 kg (199 lb)] 89.812 kg (198 lb) (05/23 0431)  Weight change:  Filed Weights   09/28/15 1159 09/28/15 2310 09/29/15 0431  Weight: 88.451 kg (195 lb) 90.266 kg (199 lb) 89.812 kg (198 lb)    Intake/Output:     Intake/Output this shift:     Physical Exam: General: NAD, laying comfortably in bed  Head: Normocephalic, atraumatic. Moist oral mucosal membranes  Eyes: Anicteric, PERRL  Neck: Supple, trachea midline  Lungs:  Clear to auscultation normal effort  Heart: Regular rate and rhythm no rubs  Abdomen:  Soft, nontender, BS present  Extremities: trace peripheral edema.  Neurologic: Nonfocal, moving all four extremities  Skin: No lesions  Access: L IJ PC, no drainage from tract, Left upper swelling noted.    Basic Metabolic Panel:  Recent Labs Lab 09/28/15 1630 09/29/15 0448  NA 140 137  K 5.3* 5.0  CL 107 105  CO2 21* 22  GLUCOSE 69 148*  BUN 26* 40*  CREATININE 8.51* 9.81*  CALCIUM 9.8 9.7    Liver Function Tests: No results for input(s): AST, ALT, ALKPHOS, BILITOT, PROT, ALBUMIN in the last 168 hours. No results for input(s): LIPASE, AMYLASE in the last 168 hours. No results for input(s): AMMONIA in the last 168 hours.  CBC:  Recent Labs Lab 09/28/15 1630 09/29/15 0450  WBC 7.5 8.0  NEUTROABS 5.4  --    HGB 9.9* 8.9*  HCT 31.0* 27.9*  MCV 87.1 88.2  PLT 135* 140*    Cardiac Enzymes:  Recent Labs Lab 09/28/15 1630 09/28/15 2344 09/29/15 0448 09/29/15 1118  TROPONINI 0.07* 0.06* 0.05* 0.06*    BNP: Invalid input(s): POCBNP  CBG: No results for input(s): GLUCAP in the last 168 hours.  Microbiology: Results for orders placed or performed during the hospital encounter of 09/15/15  Surgical pcr screen     Status: Abnormal   Collection Time: 09/15/15 11:59 AM  Result Value Ref Range Status   MRSA, PCR NEGATIVE NEGATIVE Final   Staphylococcus aureus POSITIVE (A) NEGATIVE Final    Coagulation Studies:  Recent Labs  09/28/15 1630  LABPROT 15.2*  INR 1.18    Urinalysis: No results for input(s): COLORURINE, LABSPEC, PHURINE, GLUCOSEU, HGBUR, BILIRUBINUR, KETONESUR, PROTEINUR, UROBILINOGEN, NITRITE, LEUKOCYTESUR in the last 72 hours.  Invalid input(s): APPERANCEUR    Imaging: Dg Chest 2 View  09/28/2015  CLINICAL DATA:  Cough.  On dialysis. EXAM: CHEST  2 VIEW COMPARISON:  None. FINDINGS: Left jugular dual-lumen dialysis catheter in the upper right atrium in satisfactory position. Cardiac enlargement. Negative for edema or effusion. Negative for pneumonia. Left axillary stent IMPRESSION: Cardiac enlargement.  Negative for edema or pneumonia. Electronically Signed   By: Franchot Gallo M.D.   On: 09/28/2015 13:10   Ct Angio Chest Pe W/cm &/or Wo Cm  09/28/2015  CLINICAL DATA:  Intermittent cough with chest pain and fever. Hemodialysis patient. Evaluate for pulmonary embolism. EXAM: CT ANGIOGRAPHY CHEST WITH CONTRAST TECHNIQUE: Multidetector CT imaging of the chest was performed using the standard protocol during bolus administration of intravenous contrast. Multiplanar CT image reconstructions and MIPs were obtained to evaluate the vascular anatomy. CONTRAST:  75 ml Isovue 370. COMPARISON:  Chest radiographs 09/28/2015.  Abdominal CT 11/14/2010. FINDINGS: Mediastinum: The  pulmonary arteries are well opacified with contrast. There is no evidence of acute pulmonary embolism. There is atherosclerosis of the aorta, great vessels and coronary arteries.The heart is mildly enlarged. There is no pericardial effusion. Dialysis catheters extend to the lower SVC. There are no enlarged mediastinal, hilar or axillary lymph nodes. The thyroid gland, trachea and esophagus demonstrate no significant findings. Lungs/Pleura: There is no pleural effusion.Mild dependent atelectasis at both lung bases. There is no confluent airspace opacity, endobronchial lesion or suspicious pulmonary nodule. Upper abdomen: No suspicious findings within the visualized upper abdomen. 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 Bilateral gynecomastia noted. Review of the MIP images confirms the above findings. IMPRESSION: 1. No evidence of acute pulmonary embolism or other acute chest process. 2. Moderate atherosclerosis and mild cardiomegaly. 3. Mild dependent atelectasis at both lung bases. Electronically Signed   By: Richardean Sale M.D.   On: 09/28/2015 18:02   US Venous Img Upper Uni Left  09/28/2015  CLINICAL DATA:  Left upper extremity pain and swelling 10 days with fever. Fistulogram 09/18/2015. Patient on heparin. Diabetic. EXAM: LEFT UPPER EXTREMITY VENOUS DOPPLER ULTRASOUND TECHNIQUE: Gray-scale sonography with graded compression, as well as color Doppler and duplex ultrasound were performed to evaluate the upper extremity deep venous system from the level of the subclavian vein and including the jugular, axillary, basilic, radial, ulnar and upper cephalic vein. Spectral Doppler was utilized to evaluate flow at rest and with distal augmentation maneuvers. COMPARISON:  None. FINDINGS: Examination somewhat difficult due to patient's restlessness as well as scarring from prior dialysis graft. Patient has tunnel  left IJ catheter for dialysis. Contralateral Subclavian Vein: Respiratory phasicity is normal and symmetric with the symptomatic side. No evidence of thrombus. Normal compressibility. Internal Jugular Vein: No evidence of thrombus. Normal compressibility, respiratory phasicity and response to augmentation. Subclavian Vein: No evidence of thrombus. Normal compressibility, respiratory phasicity and response to augmentation. Axillary Vein: No evidence of thrombus. Normal compressibility, respiratory phasicity and response to augmentation. Cephalic Vein: No evidence of thrombus. Normal compressibility, respiratory phasicity and response to augmentation. Basilic Vein: No evidence of thrombus. Normal compressibility, respiratory phasicity and response to augmentation. Brachial Veins: No evidence of thrombus. Normal compressibility, respiratory phasicity and response to augmentation. Radial Veins: No evidence of thrombus. Normal compressibility, respiratory phasicity and response to augmentation. Ulnar Veins: No evidence of thrombus. Normal compressibility, respiratory phasicity and response to augmentation. Venous Reflux:  None visualized. Other Findings:  None visualized. IMPRESSION: No evidence of deep venous thrombosis. Electronically Signed   By: Marin Olp M.D.   On: 09/28/2015 23:02     Medications:   . sodium chloride     . allopurinol  100 mg Oral Daily  . calcium acetate  1,334 mg Oral TID WC  . Chlorhexidine Gluconate Cloth  6 each Topical Once  . cinacalcet  90 mg Oral BID WC  . clonazePAM  0.5 mg Oral QHS  . furosemide  40 mg Oral Q T,Th,S,Su  . heparin subcutaneous  5,000  Units Subcutaneous Q8H  . hydrALAZINE  25 mg Oral BID  . metoprolol  50 mg Oral BID  . ramipril  10 mg Oral QHS  . sevelamer carbonate  2,400 mg Oral TID WC  . sodium chloride flush  3 mL Intravenous Q12H   acetaminophen **OR** acetaminophen, colchicine, HYDROcodone-acetaminophen, labetalol, morphine injection,  nitroGLYCERIN, ondansetron **OR** ondansetron (ZOFRAN) IV  Assessment/ Plan:  65 y.o. male with pmhx of ESRD, cardiomyopathy, hypertension, anemia of ckd, shpth, e. coli bacteremia 9/12, left hand inflammatory arthritis resolved, angioplasty cephalic vein [id number], access declot 9/15. colonoscopy 02/2015, admitted for fever/chills 09/28/15.  [facility]/MWF  1. End-stage renal disease on hemodialysis Monday, Wednesday, Friday.  Patient completed hemodialysis yesterday. He tolerated this well. No acute indication for dialysis today.  2. Fevers. Patient was found to have fever yesterday. He was administered vancomycin 1 g IV. We will request pharmacy to continue this for now until blood cultures are back. His PermCath does not appear to be obviously infected however we have consulted with vascular surgery and this will likely be removed.  3. Anemia of chronic kidney disease. Hemoglobin currently 8.9. We will start the patient on Epogen 10,000 units IV with dialysis.  4. Secondary hyperparathyroidism. Continue Renvela as well as Sensipar. Follow-up PTH as well as phosphorus tomorrow with dialysis.     LOS: 1 Adeja Sarratt 5/23/20171:41 PM

## 2015-09-30 ENCOUNTER — Encounter
Admission: EM | Disposition: A | Discharge status: Home / Self Care | Payer: Self-pay | Source: Home / Self Care | Attending: Internal Medicine

## 2015-09-30 ENCOUNTER — Encounter: Payer: Self-pay | Admitting: Vascular Surgery

## 2015-09-30 HISTORY — PX: PERIPHERAL VASCULAR CATHETERIZATION: SHX172C

## 2015-09-30 LAB — ECHOCARDIOGRAM COMPLETE
Height: 68 in
Weight: 3168 oz

## 2015-09-30 LAB — PHOSPHORUS: PHOSPHORUS: 6.5 mg/dL — AB (ref 2.5–4.6)

## 2015-09-30 SURGERY — A/V SHUNT INTERVENTION

## 2015-09-30 MED ORDER — PENTAFLUOROPROP-TETRAFLUOROETH EX AERO
1.0000 "application " | INHALATION_SPRAY | CUTANEOUS | Status: DC | PRN
  Filled 2015-09-30: qty 30

## 2015-09-30 MED ORDER — MIDAZOLAM HCL 5 MG/5ML IJ SOLN
INTRAMUSCULAR | Status: AC
  Filled 2015-09-30: qty 5

## 2015-09-30 MED ORDER — FENTANYL CITRATE (PF) 100 MCG/2ML IJ SOLN
INTRAMUSCULAR | Status: AC
  Filled 2015-09-30: qty 2

## 2015-09-30 MED ORDER — LIDOCAINE-EPINEPHRINE (PF) 1 %-1:200000 IJ SOLN
INTRAMUSCULAR | Status: AC
  Filled 2015-09-30: qty 30

## 2015-09-30 MED ORDER — DEXTROSE 5 % IV SOLN
INTRAVENOUS | Status: AC
  Filled 2015-09-30 (×27): qty 1.5

## 2015-09-30 MED ORDER — SODIUM CHLORIDE 0.9 % IV SOLN: 100.0000 mL | INTRAVENOUS | Status: DC | PRN

## 2015-09-30 MED ORDER — IOPAMIDOL (ISOVUE-300) INJECTION 61%
INTRAVENOUS | Status: DC | PRN
  Administered 2015-09-30: 30 mL via INTRA_ARTERIAL

## 2015-09-30 MED ORDER — EPOETIN ALFA 10000 UNIT/ML IJ SOLN
10000.0000 [IU] | INTRAMUSCULAR | Status: DC
  Administered 2015-10-02: 10000 [IU] via INTRAVENOUS

## 2015-09-30 MED ORDER — MIDAZOLAM HCL 2 MG/2ML IJ SOLN
INTRAMUSCULAR | Status: DC | PRN
  Administered 2015-09-30: 2 mg via INTRAVENOUS

## 2015-09-30 MED ORDER — LIDOCAINE-EPINEPHRINE (PF) 1 %-1:200000 IJ SOLN
INTRAMUSCULAR | Status: DC | PRN
  Administered 2015-09-30: 10 mL via INTRADERMAL

## 2015-09-30 MED ORDER — HEPARIN SODIUM (PORCINE) 1000 UNIT/ML IJ SOLN
INTRAMUSCULAR | Status: DC | PRN
  Administered 2015-09-30: 3000 [IU] via INTRAVENOUS

## 2015-09-30 MED ORDER — ALTEPLASE 2 MG IJ SOLR: 2.0000 mg | Freq: Once | INTRAMUSCULAR | Status: DC | PRN

## 2015-09-30 MED ORDER — HEPARIN (PORCINE) IN NACL 2-0.9 UNIT/ML-% IJ SOLN
INTRAMUSCULAR | Status: AC
  Filled 2015-09-30: qty 1000

## 2015-09-30 MED ORDER — LIDOCAINE HCL (PF) 1 % IJ SOLN
5.0000 mL | INTRAMUSCULAR | Status: DC | PRN
  Filled 2015-09-30: qty 5

## 2015-09-30 MED ORDER — HEPARIN SODIUM (PORCINE) 1000 UNIT/ML IJ SOLN
INTRAMUSCULAR | Status: AC
  Filled 2015-09-30: qty 1

## 2015-09-30 MED ORDER — FENTANYL CITRATE (PF) 100 MCG/2ML IJ SOLN
INTRAMUSCULAR | Status: DC | PRN
  Administered 2015-09-30 (×2): 50 ug via INTRAVENOUS

## 2015-09-30 MED ORDER — METHYLPREDNISOLONE SODIUM SUCC 125 MG IJ SOLR
INTRAMUSCULAR | Status: AC
  Administered 2015-09-30: 125 mg
  Filled 2015-09-30: qty 2

## 2015-09-30 MED ORDER — HEPARIN SODIUM (PORCINE) 1000 UNIT/ML DIALYSIS
1000.0000 [IU] | INTRAMUSCULAR | Status: DC | PRN
  Filled 2015-09-30: qty 1

## 2015-09-30 MED ORDER — FAMOTIDINE 20 MG PO TABS
ORAL_TABLET | ORAL | Status: AC
  Administered 2015-09-30: 40 mg
  Filled 2015-09-30: qty 2

## 2015-09-30 MED ORDER — LIDOCAINE-PRILOCAINE 2.5-2.5 % EX CREA
1.0000 "application " | TOPICAL_CREAM | CUTANEOUS | Status: DC | PRN
  Filled 2015-09-30: qty 5

## 2015-09-30 SURGICAL SUPPLY — 12 items
BALLN ARMADA 12X60X80 (BALLOONS) ×3
BALLOON ARMADA 12X60X80 (BALLOONS) ×2 IMPLANT
CANNULA 5F STIFF (CANNULA) ×3 IMPLANT
DEVICE PRESTO INFLATION (MISCELLANEOUS) ×3 IMPLANT
DRAPE BRACHIAL (DRAPES) ×3 IMPLANT
PACK ANGIOGRAPHY (CUSTOM PROCEDURE TRAY) ×3 IMPLANT
SHEATH BRITE TIP 6FRX5.5 (SHEATH) ×6 IMPLANT
SUT MNCRL 4-0 (SUTURE) ×1
SUT MNCRL 4-0 27XMFL (SUTURE) ×2
SUTURE MNCRL 4-0 27XMF (SUTURE) ×2 IMPLANT
TOWEL OR 17X26 4PK STRL BLUE (TOWEL DISPOSABLE) ×3 IMPLANT
WIRE MAGIC TOR.035 180C (WIRE) ×3 IMPLANT

## 2015-09-30 NOTE — Progress Notes (Signed)
Pre Dialysis 

## 2015-09-30 NOTE — Progress Notes (Signed)
Post dialysis 

## 2015-09-30 NOTE — Care Management Note (Signed)
Patient is active at [facility].  MWF 1st shift.  Admission records are being sent and adttional records will be sent at discharge.  Iran Sizer  Dialysis Coordinator  [phone]

## 2015-09-30 NOTE — Op Note (Signed)
[facility]    OPERATIVE NOTE   PROCEDURE: 1.  Left brachial artery to axillary vein arteriovenous graft cannulation under ultrasound guidance 2.  Left arm shuntogram 3.  Percutaneous transluminal angioplasty of left innominate vein with 12 mm diameter by 6 cm length angioplasty balloon 4. Removal of left jugular PermCath  PRE-OPERATIVE DIAGNOSIS: 1. ESRD 2. Malfunctioning left arm arteriovenous graft was marked arm swelling 3. Fever with suspected PermCath infection  POST-OPERATIVE DIAGNOSIS: same as above   SURGEON: Leotis Pain, MD  ANESTHESIA: local with MCS  ESTIMATED BLOOD LOSS: Minimal  FINDING(S): 1. 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  SPECIMEN(S):  None  CONTRAST: 30 cc  FLUORO TIME: 1.5 minutes  MODERATE CONSCIOUS SEDATION TIME:  Approximately 30 minutes using 2 mg of Versed and 100 mcg of Fentanyl  INDICATIONS: Johnny [name]. is a 65 y.o. male who presents with malfunctioning left brachial artery to axillary vein arteriovenous graft. He has marked arm swelling. He also has fever and it is suspected that his PermCath may be infected. The patient is scheduled for  removal of his PermCath as well as left arm shuntogram.  The patient is aware the risks include but are not limited to: bleeding, infection, thrombosis of the cannulated access, and possible anaphylactic reaction to the contrast.  The patient is aware of the risks of the procedure and elects to proceed forward.  DESCRIPTION: After full informed written consent was obtained, the patient was brought back to the angiography suite and placed supine upon the angiography table.  The patient was connected to monitoring equipment. Moderate conscious sedation was administered during a face to face encounter throughout the procedure with my supervision of the RN administering medicines and monitoring the patient's vital signs, pulse oximetry,  telemetry and mental status throughout from the start of the procedure until the patient was taken to the recovery room The  left chest neck and existing catheter and left arm was prepped and draped in the standard fashion for a percutaneous access intervention.  Under ultrasound guidance, the left brachial artery to axillary vein arteriovenous graft was cannulated with a micropuncture needle under direct ultrasound guidance and a permanent image was performed.  The microwire was advanced into the fistula and the needle was exchanged for the a microsheath.  I then upsized to a 6 Fr Sheath and imaging was performed.  Hand injections were completed to image the access including the central venous system as well as the arterial anastomosis with compression of the graft. 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  Based on the images, this patient will need angioplasty of the central venous stenosis and I plan to do this after the catheter was removed. I then turned my attention to the existing catheter. The area around the catheter was anesthetized with 1% lidocaine in the pursestring suture was removed. The catheter was then dissected free and the minimal fiber sheath that was adherent to the cuff was removed easily with gentle traction. The catheter was then removed in its entirety without difficulty and the catheter tip sent for culture.  I then gave the patient 3000 units of intravenous heparin. I then crossed the stenosis with a Magic Tourqe wire.  Based on the imaging, a 12 mm x 6 cm  standard angioplasty balloon was selected.  The balloon was centered around the left innominate vein stenosis and inflated to 8 ATM  for 1 minute(s).  On completion imaging, a 20-25 % residual stenosis was present.    Based on the completion imaging, no further intervention is necessary.  The wire and balloon were removed from the sheath.  A 4-0 Monocryl purse-string  suture was sewn around the sheath.  The sheath was removed while tying down the suture.  A sterile bandage was applied to the puncture site.  COMPLICATIONS: None  CONDITION: Stable   Johnny [name]  09/30/2015 2:47 PM

## 2015-09-30 NOTE — Care Management Important Message (Signed)
Important Message  Patient Details  Name: Johnny [name]. MRN: [id number] Date of Birth: 12/04/1950   Medicare Important Message Given:  Yes    [name] 09/30/2015, 11:50 AM

## 2015-09-30 NOTE — Progress Notes (Signed)
Pre dialysis  

## 2015-09-30 NOTE — Progress Notes (Signed)
Dialysis complete

## 2015-09-30 NOTE — H&P (Signed)
  [facility] History & Physical Update  The patient was interviewed and re-examined.  The patient's previous History and Physical has been reviewed and is unchanged.  There is no change in the plan of care. We plan to proceed with the scheduled procedure.  Glynda Soliday, MD  09/30/2015, 2:04 PM

## 2015-09-30 NOTE — Consult Note (Signed)
[facility] Consult Note  MRN : [id number]  Johnny Jha. is a 65 y.o. (07-29-1950) male who presents with chief complaint of  Chief Complaint  Patient presents with  . Fever  .  History of Present Illness: I am asked by Dr. Holley Raring and nephrology to see the patient regarding his dialysis access and fever. He has been admitted with fever worsening with dialysis which would be concerning for a PermCath infection. He had an AV graft placed about 2 weeks ago which has not yet been used for dialysis. His left arm is significantly swollen and no postoperative evaluation had been done on the graft. Given his fever, with PermCath infection being the most likely source we are asked to consider removal of the catheter. He has some mild fatigue and generalized malaise but otherwise no major symptoms other than the fever.  Current Facility-Administered Medications  Medication Dose Route Frequency Provider Last Rate Last Dose  . 0.9 %  sodium chloride infusion   Intravenous Continuous Algernon Huxley, MD      . Doug Sou Hold] acetaminophen (TYLENOL) tablet 650 mg  650 mg Oral Q6H PRN Lance Coon, MD       Or  . Doug Sou Hold] acetaminophen (TYLENOL) suppository 650 mg  650 mg Rectal Q6H PRN Lance Coon, MD      . Doug Sou Hold] allopurinol (ZYLOPRIM) tablet 100 mg  100 mg Oral Daily Lance Coon, MD   100 mg at 09/29/15 0953  . [MAR Hold] calcium acetate (PHOSLO) capsule 1,334 mg  1,334 mg Oral TID WC Lance Coon, MD   1,334 mg at 09/29/15 1700  . [MAR Hold] cinacalcet (SENSIPAR) tablet 90 mg  90 mg Oral BID WC Lance Coon, MD   90 mg at 09/29/15 1700  . [MAR Hold] clonazePAM (KLONOPIN) tablet 0.5 mg  0.5 mg Oral QHS Lance Coon, MD   0.5 mg at 09/29/15 2112  . [MAR Hold] colchicine tablet 0.6 mg  0.6 mg Oral Daily PRN Lance Coon, MD      . Doug Sou Hold] epoetin alfa (EPOGEN,PROCRIT) injection 10,000 Units  10,000 Units Intravenous Q M,W,F-HD Munsoor Lateef, MD      . Doug Sou Hold]  furosemide (LASIX) tablet 40 mg  40 mg Oral Q T,Th,S,Su Lance Coon, MD   40 mg at 09/29/15 0954  . [MAR Hold] heparin injection 5,000 Units  5,000 Units Subcutaneous Q8H Gladstone Lighter, MD   5,000 Units at 09/29/15 2112  . [MAR Hold] hydrALAZINE (APRESOLINE) tablet 25 mg  25 mg Oral BID Lance Coon, MD   25 mg at 09/29/15 2112  . [MAR Hold] HYDROcodone-acetaminophen (NORCO/VICODIN) 5-325 MG per tablet 1 tablet  1 tablet Oral Q4H PRN Gladstone Lighter, MD      . Doug Sou Hold] labetalol (NORMODYNE,TRANDATE) injection 10 mg  10 mg Intravenous Q2H PRN Lance Coon, MD   10 mg at 09/28/15 2350  . [MAR Hold] metoprolol (LOPRESSOR) tablet 50 mg  50 mg Oral BID Lance Coon, MD   50 mg at 09/29/15 2112  . [MAR Hold] morphine 2 MG/ML injection 2 mg  2 mg Intravenous Q4H PRN Lance Coon, MD      . Doug Sou Hold] nitroGLYCERIN (NITROSTAT) SL tablet 0.4 mg  0.4 mg Sublingual Q5 min PRN Carrie Mew, MD   0.4 mg at 09/28/15 1740  . [MAR Hold] ondansetron (ZOFRAN) tablet 4 mg  4 mg Oral Q6H PRN Lance Coon, MD       Or  . Doug Sou  Hold] ondansetron (ZOFRAN) injection 4 mg  4 mg Intravenous Q6H PRN Lance Coon, MD      . Doug Sou Hold] ramipril (ALTACE) capsule 10 mg  10 mg Oral QHS Lance Coon, MD   10 mg at 09/29/15 2113  . [MAR Hold] sevelamer carbonate (RENVELA) tablet 2,400 mg  2,400 mg Oral TID WC Lance Coon, MD   2,400 mg at 09/29/15 1700  . [MAR Hold] sodium chloride flush (NS) 0.9 % injection 3 mL  3 mL Intravenous Q12H Lance Coon, MD   3 mL at 09/30/15 1000  . [MAR Hold] vancomycin (VANCOCIN) IVPB 750 mg/150 ml premix  750 mg Intravenous Q M,W,F-HD Gladstone Lighter, MD   750 mg at 09/30/15 1128    Past Medical History  Diagnosis Date  . Hypertension   . Chronic kidney disease   . Gout   . Hyperlipidemia   . CHF (congestive heart failure) ([place])   . Anemia     Past Surgical History  Procedure Laterality Date  . Dialysis fistula creation    . Peripheral vascular catheterization Left  09/01/2015    Procedure: A/V Shuntogram/Fistulagram;  Surgeon: Katha Cabal, MD;  Location: [facility];  Service: Cardiovascular;  Laterality: Left;  . Av fistula placement Left 09/18/2015    Procedure: INSERTION OF ARTERIOVENOUS (AV) GORE-TEX GRAFT ARM ( BRACH/AXILLARY GRAFT W/ INSTANT STICK GRAFT );  Surgeon: Katha Cabal, MD;  Location: [facility];  Service: Vascular;  Laterality: Left;    Social History Social History  Substance Use Topics  . Smoking status: Former Research scientist (life sciences)  . Smokeless tobacco: Never Used  . Alcohol Use: No  No IV drug use  Family History Family History  Problem Relation Age of Onset  . Hypertension    . Heart disease    No bleeding disorders, clotting disorders, or autoimmune diseases  Allergies  Allergen Reactions  . Shellfish Allergy Anaphylaxis     REVIEW OF SYSTEMS (Negative unless checked)  Constitutional: [] Weight loss  [x] Fever  [x] Chills Cardiac: [] Chest pain   [] Chest pressure   [] Palpitations   [] Shortness of breath when laying flat   [] Shortness of breath at rest   [] Shortness of breath with exertion. Vascular:  [] Pain in legs with walking   [] Pain in legs at rest   [] Pain in legs when laying flat   [] Claudication   [] Pain in feet when walking  [] Pain in feet at rest  [] Pain in feet when laying flat   [] History of DVT   [] Phlebitis   [x] Swelling in legs   [] Varicose veins   [] Non-healing ulcers Pulmonary:   [] Uses home oxygen   [] Productive cough   [] Hemoptysis   [] Wheeze  [] COPD   [] Asthma Neurologic:  [] Dizziness  [] Blackouts   [] Seizures   [] History of stroke   [] History of TIA  [] Aphasia   [] Temporary blindness   [] Dysphagia   [] Weakness or numbness in arms   [] Weakness or numbness in legs Musculoskeletal:  [] Arthritis   [] Joint swelling   [] Joint pain   [] Low back pain Hematologic:  [] Easy bruising  [] Easy bleeding   [] Hypercoagulable state   [] Anemic  [] Hepatitis Gastrointestinal:  [] Blood in stool   [] Vomiting blood   [] Gastroesophageal reflux/heartburn   [] Difficulty swallowing. Genitourinary:  [x] Chronic kidney disease   [] Difficult urination  [] Frequent urination  [] Burning with urination   [] Blood in urine Skin:  [] Rashes   [] Ulcers   [] Wounds Psychological:  [] History of anxiety   []  History of major depression.  Physical Examination  Filed Vitals:   09/30/15 1230 09/30/15 1233 09/30/15 1236 09/30/15 1311  BP: 112/64 133/67 127/69 155/68  Pulse: 65 66 62 64  Temp:  97.5 F (36.4 C)  97.5 F (36.4 C)  TempSrc:  Oral    Resp: 23 13 22 15   Height:      Weight:   90.3 kg (199 lb 1.2 oz)   SpO2: 100% 93% 99% 100%   Body mass index is 30.28 kg/(m^2). Gen:  WD/WN, NAD Head: [place]/AT, No temporalis wasting. Prominent temp pulse not noted. Ear/Nose/Throat: Hearing grossly intact, nares w/o erythema or drainage, oropharynx w/o Erythema/Exudate Eyes: PERRLA, EOMI.  Neck: Supple, no nuchal rigidity.  No JVD.  Pulmonary:  Good air movement, equal bilaterally.  Cardiac: RRR, normal S1, S2 Vascular: PermCath present in the left neck. AV graft in left arm with good bruit. Thrill not as easy to palpate due to swelling. Vessel Right Left  Radial Palpable Palpable                                   Gastrointestinal: soft, non-tender/non-distended. No guarding/reflex. No masses, surgical incisions, or scars. Musculoskeletal: M/S 5/5 throughout.  Extremities without ischemic changes.  No deformity or atrophy. Left arm with 2+ edema. Neurologic: CN 2-12 intact. Pain and light touch intact in extremities.  Symmetrical.  Speech is fluent. Motor exam as listed above. Psychiatric: Judgment intact, Mood & affect appropriate for pt's clinical situation. Dermatologic: No rashes or ulcers noted.  No cellulitis or open wounds. Lymph : No Cervical, Axillary, or Inguinal lymphadenopathy.       CBC Lab Results  Component Value Date   WBC 8.0 09/29/2015   HGB 8.9* 09/29/2015   HCT 27.9* 09/29/2015   MCV  88.2 09/29/2015   PLT 140* 09/29/2015    BMET    Component Value Date/Time   NA 137 09/29/2015 0448   NA 139 01/17/2014 0502   K 5.0 09/29/2015 0448   K 4.8 01/17/2014 0502   CL 105 09/29/2015 0448   CL 102 01/17/2014 0502   CO2 22 09/29/2015 0448   CO2 27 01/17/2014 0502   GLUCOSE 148* 09/29/2015 0448   GLUCOSE 101* 01/17/2014 0502   BUN 40* 09/29/2015 0448   BUN 62* 01/17/2014 0502   CREATININE 9.81* 09/29/2015 0448   CREATININE 12.30* 01/17/2014 0502   CALCIUM 9.7 09/29/2015 0448   CALCIUM 8.2* 01/17/2014 0502   GFRNONAA 5* 09/29/2015 0448   GFRNONAA 4* 01/17/2014 0502   GFRNONAA 7* 06/20/2011 2221   GFRAA 6* 09/29/2015 0448   GFRAA 4* 01/17/2014 0502   GFRAA 9* 06/20/2011 2221   Estimated Creatinine Clearance: 8.1 mL/min (by C-G formula based on Cr of 9.81).  COAG Lab Results  Component Value Date   INR 1.18 09/28/2015   INR 1.04 09/15/2015   INR 1.0 06/11/2011    Radiology Dg Chest 2 View  09/28/2015  CLINICAL DATA:  Cough.  On dialysis. EXAM: CHEST  2 VIEW COMPARISON:  None. FINDINGS: Left jugular dual-lumen dialysis catheter in the upper right atrium in satisfactory position. Cardiac enlargement. Negative for edema or effusion. Negative for pneumonia. Left axillary stent IMPRESSION: Cardiac enlargement.  Negative for edema or pneumonia. Electronically Signed   By: Franchot Gallo M.D.   On: 09/28/2015 13:10   Ct Angio Chest Pe W/cm &/or Wo Cm  09/28/2015  CLINICAL DATA:  Intermittent cough with chest pain and fever. Hemodialysis patient. Evaluate  for pulmonary embolism. EXAM: CT ANGIOGRAPHY CHEST WITH CONTRAST TECHNIQUE: Multidetector CT imaging of the chest was performed using the standard protocol during bolus administration of intravenous contrast. Multiplanar CT image reconstructions and MIPs were obtained to evaluate the vascular anatomy. CONTRAST:  75 ml Isovue 370. COMPARISON:  Chest radiographs 09/28/2015.  Abdominal CT 11/14/2010. FINDINGS: Mediastinum: The  pulmonary arteries are well opacified with contrast. There is no evidence of acute pulmonary embolism. There is atherosclerosis of the aorta, great vessels and coronary arteries.The heart is mildly enlarged. There is no pericardial effusion. Dialysis catheters extend to the lower SVC. There are no enlarged mediastinal, hilar or axillary lymph nodes. The thyroid gland, trachea and esophagus demonstrate no significant findings. Lungs/Pleura: There is no pleural effusion.Mild dependent atelectasis at both lung bases. There is no confluent airspace opacity, endobronchial lesion or suspicious pulmonary nodule. Upper abdomen: No suspicious findings within the visualized upper abdomen. 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 Bilateral gynecomastia noted. Review of the MIP images confirms the above findings. IMPRESSION: 1. No evidence of acute pulmonary embolism or other acute chest process. 2. Moderate atherosclerosis and mild cardiomegaly. 3. Mild dependent atelectasis at both lung bases. Electronically Signed   By: Richardean Sale M.D.   On: 09/28/2015 18:02   US Venous Img Upper Uni Left  09/28/2015  CLINICAL DATA:  Left upper extremity pain and swelling 10 days with fever. Fistulogram 09/18/2015. Patient on heparin. Diabetic. EXAM: LEFT UPPER EXTREMITY VENOUS DOPPLER ULTRASOUND TECHNIQUE: Gray-scale sonography with graded compression, as well as color Doppler and duplex ultrasound were performed to evaluate the upper extremity deep venous system from the level of the subclavian vein and including the jugular, axillary, basilic, radial, ulnar and upper cephalic vein. Spectral Doppler was utilized to evaluate flow at rest and with distal augmentation maneuvers. COMPARISON:  None. FINDINGS: Examination somewhat difficult due to patient's restlessness as well as scarring from prior dialysis graft. Patient has tunnel  left IJ catheter for dialysis. Contralateral Subclavian Vein: Respiratory phasicity is normal and symmetric with the symptomatic side. No evidence of thrombus. Normal compressibility. Internal Jugular Vein: No evidence of thrombus. Normal compressibility, respiratory phasicity and response to augmentation. Subclavian Vein: No evidence of thrombus. Normal compressibility, respiratory phasicity and response to augmentation. Axillary Vein: No evidence of thrombus. Normal compressibility, respiratory phasicity and response to augmentation. Cephalic Vein: No evidence of thrombus. Normal compressibility, respiratory phasicity and response to augmentation. Basilic Vein: No evidence of thrombus. Normal compressibility, respiratory phasicity and response to augmentation. Brachial Veins: No evidence of thrombus. Normal compressibility, respiratory phasicity and response to augmentation. Radial Veins: No evidence of thrombus. Normal compressibility, respiratory phasicity and response to augmentation. Ulnar Veins: No evidence of thrombus. Normal compressibility, respiratory phasicity and response to augmentation. Venous Reflux:  None visualized. Other Findings:  None visualized. IMPRESSION: No evidence of deep venous thrombosis. Electronically Signed   By: Marin Olp M.D.   On: 09/28/2015 23:02      Assessment/Plan 1. Fever. PermCath infection is the most likely source although it is not obviously purulent or erythematous. At this point, I believe the PermCath should be removed and we will do this today. I have discussed with Dr. Holley Raring from nephrology and giving him his dialysis through the catheter they will allow [name] to be catheter free for several days. In addition, a shuntogram will be performed to evaluate the graft and see if this would be usable which may also avoid  another catheter. 2. End-stage renal disease. Status post hemodialysis today. Access issues as described above. 3. Hypertension. Stable. On  outpatient medications.   DEW,JASON, MD  09/30/2015 2:10 PM

## 2015-09-30 NOTE — Progress Notes (Signed)
Tx started 

## 2015-09-30 NOTE — Progress Notes (Signed)
[facility]  ROUNDING NOTE   Subjective:  Patient completed dialysis today. Dialysis catheter to be removed by vascular surgery today.  Objective:  Vital signs in last 24 hours:  Temp:  [97.5 F (36.4 C)-98 F (36.7 C)] 97.5 F (36.4 C) (05/24 1233) Pulse Rate:  [59-66] 62 (05/24 1236) Resp:  [0-26] 22 (05/24 1236) BP: (112-153)/(61-75) 127/69 mmHg (05/24 1236) SpO2:  [93 %-100 %] 99 % (05/24 1236) Weight:  [90.3 kg (199 lb 1.2 oz)-91.8 kg (202 lb 6.1 oz)] 90.3 kg (199 lb 1.2 oz) (05/24 1236)  Weight change:  Filed Weights   09/29/15 0431 09/30/15 0959 09/30/15 1236  Weight: 89.812 kg (198 lb) 91.8 kg (202 lb 6.1 oz) 90.3 kg (199 lb 1.2 oz)    Intake/Output: I/O last 3 completed shifts: In: 240 [P.O.:240] Out: 0    Intake/Output this shift:  Total I/O In: -  Out: 1500 [Other:1500]  Physical Exam: General: NAD, laying comfortably in bed  Head: Normocephalic, atraumatic. Moist oral mucosal membranes  Eyes: Anicteric  Neck: Supple, trachea midline  Lungs:  Clear to auscultation normal effort  Heart: Regular rate and rhythm no rubs  Abdomen:  Soft, nontender, BS present  Extremities: trace peripheral edema.  Neurologic: Nonfocal, moving all four extremities  Skin: No lesions  Access: L IJ PC, Left upper swelling noted.    Basic Metabolic Panel:  Recent Labs Lab 09/28/15 1630 09/29/15 0448 09/30/15 1000  NA 140 137  --   K 5.3* 5.0  --   CL 107 105  --   CO2 21* 22  --   GLUCOSE 69 148*  --   BUN 26* 40*  --   CREATININE 8.51* 9.81*  --   CALCIUM 9.8 9.7  --   PHOS  --   --  6.5*    Liver Function Tests: No results for input(s): AST, ALT, ALKPHOS, BILITOT, PROT, ALBUMIN in the last 168 hours. No results for input(s): LIPASE, AMYLASE in the last 168 hours. No results for input(s): AMMONIA in the last 168 hours.  CBC:  Recent Labs Lab 09/28/15 1630 09/29/15 0450  WBC 7.5 8.0  NEUTROABS 5.4  --   HGB 9.9* 8.9*  HCT 31.0* 27.9*   MCV 87.1 88.2  PLT 135* 140*    Cardiac Enzymes:  Recent Labs Lab 09/28/15 1630 09/28/15 2344 09/29/15 0448 09/29/15 1118  TROPONINI 0.07* 0.06* 0.05* 0.06*    BNP: Invalid input(s): POCBNP  CBG: No results for input(s): GLUCAP in the last 168 hours.  Microbiology: Results for orders placed or performed during the hospital encounter of 09/28/15  CULTURE, BLOOD (ROUTINE X 2) w Reflex to ID Panel     Status: None (Preliminary result)   Collection Time: 09/29/15 11:18 AM  Result Value Ref Range Status   Specimen Description BLOOD RIGHT AC  Final   Special Requests   Final    BOTTLES DRAWN AEROBIC AND ANAEROBIC AER 10ML ANA 5ML   Culture NO GROWTH < 24 HOURS  Final   Report Status PENDING  Incomplete  CULTURE, BLOOD (ROUTINE X 2) w Reflex to ID Panel     Status: None (Preliminary result)   Collection Time: 09/29/15 11:23 AM  Result Value Ref Range Status   Specimen Description BLOOD RIGHT WRIST  Final   Special Requests   Final    BOTTLES DRAWN AEROBIC AND ANAEROBIC AER 8ML ANA 4ML   Culture NO GROWTH < 24 HOURS  Final   Report Status PENDING  Incomplete    Coagulation Studies:  Recent Labs  09/28/15 1630  LABPROT 15.2*  INR 1.18    Urinalysis: No results for input(s): COLORURINE, LABSPEC, PHURINE, GLUCOSEU, HGBUR, BILIRUBINUR, KETONESUR, PROTEINUR, UROBILINOGEN, NITRITE, LEUKOCYTESUR in the last 72 hours.  Invalid input(s): APPERANCEUR    Imaging: Ct Angio Chest Pe W/cm &/or Wo Cm  09/28/2015  CLINICAL DATA:  Intermittent cough with chest pain and fever. Hemodialysis patient. Evaluate for pulmonary embolism. EXAM: CT ANGIOGRAPHY CHEST WITH CONTRAST TECHNIQUE: Multidetector CT imaging of the chest was performed using the standard protocol during bolus administration of intravenous contrast. Multiplanar CT image reconstructions and MIPs were obtained to evaluate the vascular anatomy. CONTRAST:  75 ml Isovue 370. COMPARISON:  Chest radiographs 09/28/2015.   Abdominal CT 11/14/2010. FINDINGS: Mediastinum: The pulmonary arteries are well opacified with contrast. There is no evidence of acute pulmonary embolism. There is atherosclerosis of the aorta, great vessels and coronary arteries.The heart is mildly enlarged. There is no pericardial effusion. Dialysis catheters extend to the lower SVC. There are no enlarged mediastinal, hilar or axillary lymph nodes. The thyroid gland, trachea and esophagus demonstrate no significant findings. Lungs/Pleura: There is no pleural effusion.Mild dependent atelectasis at both lung bases. There is no confluent airspace opacity, endobronchial lesion or suspicious pulmonary nodule. Upper abdomen: No suspicious findings within the visualized upper abdomen. 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 Bilateral gynecomastia noted. Review of the MIP images confirms the above findings. IMPRESSION: 1. No evidence of acute pulmonary embolism or other acute chest process. 2. Moderate atherosclerosis and mild cardiomegaly. 3. Mild dependent atelectasis at both lung bases. Electronically Signed   By: Richardean Sale M.D.   On: 09/28/2015 18:02   US Venous Img Upper Uni Left  09/28/2015  CLINICAL DATA:  Left upper extremity pain and swelling 10 days with fever. Fistulogram 09/18/2015. Patient on heparin. Diabetic. EXAM: LEFT UPPER EXTREMITY VENOUS DOPPLER ULTRASOUND TECHNIQUE: Gray-scale sonography with graded compression, as well as color Doppler and duplex ultrasound were performed to evaluate the upper extremity deep venous system from the level of the subclavian vein and including the jugular, axillary, basilic, radial, ulnar and upper cephalic vein. Spectral Doppler was utilized to evaluate flow at rest and with distal augmentation maneuvers. COMPARISON:  None. FINDINGS: Examination somewhat difficult due to patient's restlessness as well as  scarring from prior dialysis graft. Patient has tunnel left IJ catheter for dialysis. Contralateral Subclavian Vein: Respiratory phasicity is normal and symmetric with the symptomatic side. No evidence of thrombus. Normal compressibility. Internal Jugular Vein: No evidence of thrombus. Normal compressibility, respiratory phasicity and response to augmentation. Subclavian Vein: No evidence of thrombus. Normal compressibility, respiratory phasicity and response to augmentation. Axillary Vein: No evidence of thrombus. Normal compressibility, respiratory phasicity and response to augmentation. Cephalic Vein: No evidence of thrombus. Normal compressibility, respiratory phasicity and response to augmentation. Basilic Vein: No evidence of thrombus. Normal compressibility, respiratory phasicity and response to augmentation. Brachial Veins: No evidence of thrombus. Normal compressibility, respiratory phasicity and response to augmentation. Radial Veins: No evidence of thrombus. Normal compressibility, respiratory phasicity and response to augmentation. Ulnar Veins: No evidence of thrombus. Normal compressibility, respiratory phasicity and response to augmentation. Venous Reflux:  None visualized. Other Findings:  None visualized. IMPRESSION: No evidence of deep venous thrombosis. Electronically Signed   By: Marin Olp M.D.   On: 09/28/2015 23:02     Medications:   . sodium chloride     .  allopurinol  100 mg Oral Daily  . calcium acetate  1,334 mg Oral TID WC  . cinacalcet  90 mg Oral BID WC  . clonazePAM  0.5 mg Oral QHS  . furosemide  40 mg Oral Q T,Th,S,Su  . heparin subcutaneous  5,000 Units Subcutaneous Q8H  . hydrALAZINE  25 mg Oral BID  . metoprolol  50 mg Oral BID  . ramipril  10 mg Oral QHS  . sevelamer carbonate  2,400 mg Oral TID WC  . sodium chloride flush  3 mL Intravenous Q12H  . vancomycin  750 mg Intravenous Q M,W,F-HD   acetaminophen **OR** acetaminophen, colchicine,  HYDROcodone-acetaminophen, labetalol, morphine injection, nitroGLYCERIN, ondansetron **OR** ondansetron (ZOFRAN) IV  Assessment/ Plan:  64 y.o. male with pmhx of ESRD, cardiomyopathy, hypertension, anemia of ckd, shpth, e. coli bacteremia 9/12, left hand inflammatory arthritis resolved, angioplasty cephalic vein [id number], access declot 9/15. colonoscopy 02/2015, admitted for fever/chills 09/28/15.  [facility]/MWF  1. End-stage renal disease on hemodialysis Monday, Wednesday, Friday.  Patient had short hemodialysis treatment today to accommodate [facility] surgery removing dialysis catheter. We will attempt to keep the patientcatheter free for short period of time.  So far however blood cultures are negative.  2. Fevers. Blood cultures remained negativeat the moment.  Continue vancomycin as ordered for now.  3. Anemia of chronic kidney disease. Continue Epogen 10,000 units IV with dialysis.  4. Secondary hyperparathyroidism. Phosphorus 6.5.  Continue Renvela as well as Sensipar.     LOS: 2 Cloe Sockwell 5/24/20171:05 PM

## 2015-09-30 NOTE — Progress Notes (Signed)
[facility] [place] NAME: Johnny [name]    MR#:  [id number]  DATE OF BIRTH:  [birth year]  SUBJECTIVE:  CHIEF COMPLAINT:   Chief Complaint  Patient presents with  . Fever   -Dialysis patient sent in after dialysis for fever and chills, also having chest pain. -Pleuritic chest pain has resolved. Troponins are stable. Heparin drip discontinued. -Patient was seen and examined during hemodialysis today. No fevers overnight. No other complaints  REVIEW OF SYSTEMS:  Review of Systems  Constitutional: Positive for fever and chills.  HENT: Negative for ear discharge, ear pain and nosebleeds.   Eyes: Negative for blurred vision and double vision.  Respiratory: Negative for cough, shortness of breath and wheezing.   Cardiovascular: Negative for chest pain, palpitations and leg swelling.       Chest pain is resolved  Gastrointestinal: Negative for nausea, vomiting, abdominal pain, diarrhea and constipation.  Genitourinary: Negative for dysuria and urgency.  Musculoskeletal: Negative for myalgias, back pain and joint pain.  Neurological: Negative for dizziness, sensory change, speech change, focal weakness, seizures and headaches.  Psychiatric/Behavioral: Negative for depression.    DRUG ALLERGIES:   Allergies  Allergen Reactions  . Shellfish Allergy Anaphylaxis    VITALS:  Blood pressure 162/81, pulse 69, temperature 97.5 F (36.4 C), temperature source Oral, resp. rate 10, height 5\' 8"  (1.727 m), weight 90.3 kg (199 lb 1.2 oz), SpO2 100 %.  PHYSICAL EXAMINATION:  Physical Exam  GENERAL:  65 y.o.-year-old patient lying in the bed with no acute distress.  EYES: Pupils equal, round, reactive to light and accommodation. No scleral icterus. Extraocular muscles intact.  HEENT: Head atraumatic, normocephalic. Oropharynx and nasopharynx clear.  NECK:  Supple, no jugular venous distention. No thyroid enlargement, no tenderness.  LUNGS:  Normal breath sounds bilaterally, no wheezing, rales,rhonchi or crepitation. No use of accessory muscles of respiration.  CARDIOVASCULAR: S1, S2 normal. No murmurs, rubs, or gallops.  Patient has a left chest permacath present. ABDOMEN: Soft, nontender, nondistended. Bowel sounds present. No organomegaly or mass.  EXTREMITIES: No pedal edema, cyanosis, or clubbing. Permacath on the left upper extremity erythematous and tender Right forearm has AV fistula with palpable thrill. Some swelling around the fistula noted. No tenderness. NEUROLOGIC: Cranial nerves II through XII are intact. Muscle strength 5/5 in all extremities. Sensation intact. Gait not checked.  PSYCHIATRIC: The patient is alert and oriented x 3.  SKIN: No obvious rash, lesion, or ulcer.    LABORATORY PANEL:   CBC  Recent Labs Lab 09/29/15 0450  WBC 8.0  HGB 8.9*  HCT 27.9*  PLT 140*   ------------------------------------------------------------------------------------------------------------------  Chemistries   Recent Labs Lab 09/29/15 0448  NA 137  K 5.0  CL 105  CO2 22  GLUCOSE 148*  BUN 40*  CREATININE 9.81*  CALCIUM 9.7   ------------------------------------------------------------------------------------------------------------------  Cardiac Enzymes  Recent Labs Lab 09/29/15 1118  TROPONINI 0.06*   ------------------------------------------------------------------------------------------------------------------  RADIOLOGY:  Ct Angio Chest Pe W/cm &/or Wo Cm  09/28/2015  CLINICAL DATA:  Intermittent cough with chest pain and fever. Hemodialysis patient. Evaluate for pulmonary embolism. EXAM: CT ANGIOGRAPHY CHEST WITH CONTRAST TECHNIQUE: Multidetector CT imaging of the chest was performed using the standard protocol during bolus administration of intravenous contrast. Multiplanar CT image reconstructions and MIPs were obtained to evaluate the vascular anatomy. CONTRAST:  75 ml Isovue 370.  COMPARISON:  Chest radiographs 09/28/2015.  Abdominal CT 11/14/2010. FINDINGS: Mediastinum: The pulmonary arteries are well opacified with contrast. There  is no evidence of acute pulmonary embolism. There is atherosclerosis of the aorta, great vessels and coronary arteries.The heart is mildly enlarged. There is no pericardial effusion. Dialysis catheters extend to the lower SVC. There are no enlarged mediastinal, hilar or axillary lymph nodes. The thyroid gland, trachea and esophagus demonstrate no significant findings. Lungs/Pleura: There is no pleural effusion.Mild dependent atelectasis at both lung bases. There is no confluent airspace opacity, endobronchial lesion or suspicious pulmonary nodule. Upper abdomen: No suspicious findings within the visualized upper abdomen. 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 Bilateral gynecomastia noted. Review of the MIP images confirms the above findings. IMPRESSION: 1. No evidence of acute pulmonary embolism or other acute chest process. 2. Moderate atherosclerosis and mild cardiomegaly. 3. Mild dependent atelectasis at both lung bases. Electronically Signed   By: Richardean Sale M.D.   On: 09/28/2015 18:02   US Venous Img Upper Uni Left  09/28/2015  CLINICAL DATA:  Left upper extremity pain and swelling 10 days with fever. Fistulogram 09/18/2015. Patient on heparin. Diabetic. EXAM: LEFT UPPER EXTREMITY VENOUS DOPPLER ULTRASOUND TECHNIQUE: Gray-scale sonography with graded compression, as well as color Doppler and duplex ultrasound were performed to evaluate the upper extremity deep venous system from the level of the subclavian vein and including the jugular, axillary, basilic, radial, ulnar and upper cephalic vein. Spectral Doppler was utilized to evaluate flow at rest and with distal augmentation maneuvers. COMPARISON:  None. FINDINGS: Examination somewhat difficult  due to patient's restlessness as well as scarring from prior dialysis graft. Patient has tunnel left IJ catheter for dialysis. Contralateral Subclavian Vein: Respiratory phasicity is normal and symmetric with the symptomatic side. No evidence of thrombus. Normal compressibility. Internal Jugular Vein: No evidence of thrombus. Normal compressibility, respiratory phasicity and response to augmentation. Subclavian Vein: No evidence of thrombus. Normal compressibility, respiratory phasicity and response to augmentation. Axillary Vein: No evidence of thrombus. Normal compressibility, respiratory phasicity and response to augmentation. Cephalic Vein: No evidence of thrombus. Normal compressibility, respiratory phasicity and response to augmentation. Basilic Vein: No evidence of thrombus. Normal compressibility, respiratory phasicity and response to augmentation. Brachial Veins: No evidence of thrombus. Normal compressibility, respiratory phasicity and response to augmentation. Radial Veins: No evidence of thrombus. Normal compressibility, respiratory phasicity and response to augmentation. Ulnar Veins: No evidence of thrombus. Normal compressibility, respiratory phasicity and response to augmentation. Venous Reflux:  None visualized. Other Findings:  None visualized. IMPRESSION: No evidence of deep venous thrombosis. Electronically Signed   By: Marin Olp M.D.   On: 09/28/2015 23:02    EKG:   Orders placed or performed during the hospital encounter of 09/28/15  . ED EKG  . ED EKG  . EKG 12-Lead  . EKG 12-Lead    ASSESSMENT AND PLAN:   65 year old male with past medical history significant for end-stage renal disease on Monday Wednesday Friday hemodialysis, known ischemic cardiomyopathy with EF of [id number], diastolic CHF, hypertension sent in from dialysis for chest pain and also fever.   # fever-patient had a recent procedure done for declotting his AV fistula. Some swelling is present on the left  arm. -Mostly from infected dialysis catheter. Vascular surgery is recommending removal of the left jugular permacath -Left arm shuntogram is scheduled for today -Appreciate nephrology and vascular surgery recommendations -Blood cultures were drawn from 5/23 have revealed no growth so far -continue vancomycin during hemodialysis  # chest pain-likely musculoskeletal chest pain. Ruled out for MI -Discontinued heparin  drip. Last stress test was in August 2016 which was negative. -Appreciate cardiology consult. Chest pain has resolved at this time. Continue colchicine when necessary for any residual  inflammatory myocarditis  # end-stage renal disease on Monday Wednesday Friday hemodialysis-last dialysis yesterday. Patient is stable. -Nephrology consulted. had hemodialysis today   # hypertension-continue home medications. Patient on metoprolol, Altace, hydralazine and Lasix  # DVT prophylaxis-start subcutaneous heparin    All the records are reviewed and case discussed with Care Management/Social Workerr. Management plans discussed with the patient, family and they are in agreement.  CODE STATUS: Full code  TOTAL TIME TAKING CARE OF THIS PATIENT: 37 minutes.   POSSIBLE D/C IN 1-2 DAYS, DEPENDING ON CLINICAL CONDITION.   Nicholes Mango M.D on 09/30/2015 at 3:25 PM  Between 7am to 6pm - Pager - [phone]  After 6pm go to [email or web address] - password [email or web address] [facility]  Office  [phone]  CC: Primary care physician; [name], MD

## 2015-10-01 ENCOUNTER — Encounter: Payer: Self-pay | Admitting: Vascular Surgery

## 2015-10-01 LAB — CBC WITH DIFFERENTIAL/PLATELET
BASOS ABS: 0 10*3/uL (ref 0–0.1)
EOS ABS: 0 10*3/uL (ref 0–0.7)
Eosinophils Relative: 0 %
HEMATOCRIT: 29 % — AB (ref 40.0–52.0)
HEMOGLOBIN: 9.1 g/dL — AB (ref 13.0–18.0)
Lymphocytes Relative: 7 %
Lymphs Abs: 1 10*3/uL (ref 1.0–3.6)
MCH: 27.4 pg (ref 26.0–34.0)
MCHC: 31.3 g/dL — AB (ref 32.0–36.0)
MCV: 87.5 fL (ref 80.0–100.0)
Monocytes Absolute: 0.7 10*3/uL (ref 0.2–1.0)
NEUTROS ABS: 13 10*3/uL — AB (ref 1.4–6.5)
Platelets: 194 10*3/uL (ref 150–440)
RBC: 3.31 MIL/uL — ABNORMAL LOW (ref 4.40–5.90)
RDW: 16.9 % — ABNORMAL HIGH (ref 11.5–14.5)
WBC: 14.8 10*3/uL — AB (ref 3.8–10.6)

## 2015-10-01 LAB — BASIC METABOLIC PANEL
ANION GAP: 13 (ref 5–15)
BUN: 43 mg/dL — ABNORMAL HIGH (ref 6–20)
CALCIUM: 8.5 mg/dL — AB (ref 8.9–10.3)
CHLORIDE: 101 mmol/L (ref 101–111)
CO2: 24 mmol/L (ref 22–32)
CREATININE: 9.64 mg/dL — AB (ref 0.61–1.24)
GFR calc non Af Amer: 5 mL/min — ABNORMAL LOW (ref >60)
GFR, EST AFRICAN AMERICAN: 6 mL/min — AB (ref >60)
Glucose, Bld: 107 mg/dL — ABNORMAL HIGH (ref 65–99)
Potassium: 4.9 mmol/L (ref 3.5–5.1)
SODIUM: 138 mmol/L (ref 135–145)

## 2015-10-01 LAB — HEPATITIS B SURFACE ANTIGEN: Hepatitis B Surface Ag: NEGATIVE

## 2015-10-01 LAB — PARATHYROID HORMONE, INTACT (NO CA): PTH: 553 pg/mL — ABNORMAL HIGH (ref 15–65)

## 2015-10-01 NOTE — Progress Notes (Signed)
[facility] [place] NAME: Johnny [name]    MR#:  [id number]  DATE OF BIRTH:  25-Apr-1951  SUBJECTIVE:  CHIEF COMPLAINT:   Chief Complaint  Patient presents with  . Fever   -Dialysis patient sent in after dialysis for fever and chills, also having chest pain. -Pleuritic chest pain has resolved. Troponins are stable. Heparin drip discontinued. -Patient was seen and examined during hemodialysis today. No fevers overnight. No other complaints  REVIEW OF SYSTEMS:  Review of Systems  Constitutional: Positive for fever and chills.  HENT: Negative for ear discharge, ear pain and nosebleeds.   Eyes: Negative for blurred vision and double vision.  Respiratory: Negative for cough, shortness of breath and wheezing.   Cardiovascular: Negative for chest pain, palpitations and leg swelling.       Chest pain is resolved  Gastrointestinal: Negative for nausea, vomiting, abdominal pain, diarrhea and constipation.  Genitourinary: Negative for dysuria and urgency.  Musculoskeletal: Negative for myalgias, back pain and joint pain.  Neurological: Negative for dizziness, sensory change, speech change, focal weakness, seizures and headaches.  Psychiatric/Behavioral: Negative for depression.    DRUG ALLERGIES:   Allergies  Allergen Reactions  . Shellfish Allergy Anaphylaxis    VITALS:  Blood pressure 137/50, pulse 62, temperature 97.3 F (36.3 C), temperature source Oral, resp. rate 20, height 5\' 8"  (1.727 m), weight 91.037 kg (200 lb 11.2 oz), SpO2 97 %.  PHYSICAL EXAMINATION:  Physical Exam  GENERAL:  65 y.o.-year-old patient lying in the bed with no acute distress.  EYES: Pupils equal, round, reactive to light and accommodation. No scleral icterus. Extraocular muscles intact.  HEENT: Head atraumatic, normocephalic. Oropharynx and nasopharynx clear.  NECK:  Supple, no jugular venous distention. No thyroid enlargement, no tenderness.  LUNGS:  Normal breath sounds bilaterally, no wheezing, rales,rhonchi or crepitation. No use of accessory muscles of respiration.  CARDIOVASCULAR: S1, S2 normal. No murmurs, rubs, or gallops.  Patient has a left chest permacath present. ABDOMEN: Soft, nontender, nondistended. Bowel sounds present. No organomegaly or mass.  EXTREMITIES: No pedal edema, cyanosis, or clubbing. Permacath on the left upper chest is removed  Right forearm has AV fistula with palpable thrill. Some swelling around the fistula noted. No tenderness. NEUROLOGIC: Cranial nerves II through XII are intact. Muscle strength 5/5 in all extremities. Sensation intact. Gait not checked.  PSYCHIATRIC: The patient is alert and oriented x 3.  SKIN: No obvious rash, lesion, or ulcer.    LABORATORY PANEL:   CBC  Recent Labs Lab 10/01/15 0515  WBC 14.8*  HGB 9.1*  HCT 29.0*  PLT 194   ------------------------------------------------------------------------------------------------------------------  Chemistries   Recent Labs Lab 10/01/15 0515  NA 138  K 4.9  CL 101  CO2 24  GLUCOSE 107*  BUN 43*  CREATININE 9.64*  CALCIUM 8.5*   ------------------------------------------------------------------------------------------------------------------  Cardiac Enzymes  Recent Labs Lab 09/29/15 1118  TROPONINI 0.06*   ------------------------------------------------------------------------------------------------------------------  RADIOLOGY:  No results found.  EKG:   Orders placed or performed during the hospital encounter of 09/28/15  . ED EKG  . ED EKG  . EKG 12-Lead  . EKG 12-Lead    ASSESSMENT AND PLAN:   65 year old male with past medical history significant for end-stage renal disease on Monday Wednesday Friday hemodialysis, known ischemic cardiomyopathy with EF of [id number], diastolic CHF, hypertension sent in from dialysis for chest pain and also fever.   # fever-patient had a recent procedure done for  declotting his AV fistula.  - -  Mostly from infected dialysis catheter. Vascular surgery has removed the left jugular permacath removed from left upper chest on 5/24 -Left arm shuntogram is done yesterday on May 24 and okay to use left upper extremity AV graft -Nephrology is planning to do dialysis tomorrow using AV graft and if there are no technical difficulties planning to discharge in after dialysis -Follow up cultures from the dialysis catheter which is pending -Appreciate nephrology and vascular surgery recommendations -Blood cultures were drawn from 5/23 have revealed no growth so far -continue vancomycin during hemodialysis  # chest pain-likely musculoskeletal chest pain. Ruled out for MI -Discontinued heparin drip. Last stress test was in August 2016 which was negative. -Appreciate cardiology consult. Chest pain has resolved at this time. Continue colchicine when necessary for any residual  inflammatory myocarditis  # end-stage renal disease on Monday Wednesday Friday hemodialysis-last dialysis yesterday. Patient is stable. -Nephrology consulted. had hemodialysis today   # hypertension-continue home medications. Patient on metoprolol, Altace, hydralazine and Lasix  # DVT prophylaxis-start subcutaneous heparin    All the records are reviewed and case discussed with Care Management/Social Workerr. Management plans discussed with the patient, family and they are in agreement.  CODE STATUS: Full code  TOTAL TIME TAKING CARE OF THIS PATIENT: 37 minutes.   POSSIBLE D/C IN 1 DAYS, DEPENDING ON CLINICAL CONDITION.   Nicholes Mango M.D on 10/01/2015 at 4:22 PM  Between 7am to 6pm - Pager - [phone]  After 6pm go to [email or web address] - password [email or web address] [facility]  Office  [phone]  CC: Primary care physician; [name], MD

## 2015-10-01 NOTE — Progress Notes (Signed)
[facility]  ROUNDING NOTE   Subjective:  Pt seen at bedside.  Permcath removed. Blood cultures negative.  Had HD yesterday. Hopefully we can use his LUE AVG tomorrow with success.  Objective:  Vital signs in last 24 hours:  Temp:  [97.5 F (36.4 C)-98 F (36.7 C)] 98 F (36.7 C) (05/25 0415) Pulse Rate:  [59-97] 63 (05/25 0735) Resp:  [10-23] 18 (05/25 0735) BP: (112-175)/(64-81) 146/64 mmHg (05/25 0735) SpO2:  [64 %-100 %] 99 % (05/25 0735) Weight:  [90.3 kg (199 lb 1.2 oz)-91.037 kg (200 lb 11.2 oz)] 91.037 kg (200 lb 11.2 oz) (05/25 0415)  Weight change:  Filed Weights   09/30/15 0959 09/30/15 1236 10/01/15 0415  Weight: 91.8 kg (202 lb 6.1 oz) 90.3 kg (199 lb 1.2 oz) 91.037 kg (200 lb 11.2 oz)    Intake/Output: I/O last 3 completed shifts: In: 240 [P.O.:240] Out: 1500 [Other:1500]   Intake/Output this shift:     Physical Exam: General: NAD, laying comfortably in bed  Head: Normocephalic, atraumatic. Moist oral mucosal membranes  Eyes: Anicteric  Neck: Supple, trachea midline  Lungs:  Clear to auscultation normal effort  Heart: Regular rate and rhythm no rubs  Abdomen:  Soft, nontender, BS present  Extremities: trace peripheral edema.  Neurologic: Nonfocal, moving all four extremities  Skin: No lesions  Access: L IJ PC, Left UE AVG    Basic Metabolic Panel:  Recent Labs Lab 09/28/15 1630 09/29/15 0448 09/30/15 1000 10/01/15 0515  NA 140 137  --  138  K 5.3* 5.0  --  4.9  CL 107 105  --  101  CO2 21* 22  --  24  GLUCOSE 69 148*  --  107*  BUN 26* 40*  --  43*  CREATININE 8.51* 9.81*  --  9.64*  CALCIUM 9.8 9.7  --  8.5*  PHOS  --   --  6.5*  --     Liver Function Tests: No results for input(s): AST, ALT, ALKPHOS, BILITOT, PROT, ALBUMIN in the last 168 hours. No results for input(s): LIPASE, AMYLASE in the last 168 hours. No results for input(s): AMMONIA in the last 168 hours.  CBC:  Recent Labs Lab 09/28/15 1630  09/29/15 0450 10/01/15 0515  WBC 7.5 8.0 14.8*  NEUTROABS 5.4  --  13.0*  HGB 9.9* 8.9* 9.1*  HCT 31.0* 27.9* 29.0*  MCV 87.1 88.2 87.5  PLT 135* 140* 194    Cardiac Enzymes:  Recent Labs Lab 09/28/15 1630 09/28/15 2344 09/29/15 0448 09/29/15 1118  TROPONINI 0.07* 0.06* 0.05* 0.06*    BNP: Invalid input(s): POCBNP  CBG: No results for input(s): GLUCAP in the last 168 hours.  Microbiology: Results for orders placed or performed during the hospital encounter of 09/28/15  CULTURE, BLOOD (ROUTINE X 2) w Reflex to ID Panel     Status: None (Preliminary result)   Collection Time: 09/29/15 11:18 AM  Result Value Ref Range Status   Specimen Description BLOOD RIGHT AC  Final   Special Requests   Final    BOTTLES DRAWN AEROBIC AND ANAEROBIC AER 10ML ANA 5ML   Culture NO GROWTH 2 DAYS  Final   Report Status PENDING  Incomplete  CULTURE, BLOOD (ROUTINE X 2) w Reflex to ID Panel     Status: None (Preliminary result)   Collection Time: 09/29/15 11:23 AM  Result Value Ref Range Status   Specimen Description BLOOD RIGHT WRIST  Final   Special Requests   Final  BOTTLES DRAWN AEROBIC AND ANAEROBIC AER 8ML ANA 4ML   Culture NO GROWTH 2 DAYS  Final   Report Status PENDING  Incomplete  Cath Tip Culture     Status: None (Preliminary result)   Collection Time: 09/30/15  2:58 PM  Result Value Ref Range Status   Specimen Description CATH TIP  Final   Special Requests NONE  Final   Culture NO GROWTH < 24 HOURS  Final   Report Status PENDING  Incomplete    Coagulation Studies:  Recent Labs  09/28/15 1630  LABPROT 15.2*  INR 1.18    Urinalysis: No results for input(s): COLORURINE, LABSPEC, PHURINE, GLUCOSEU, HGBUR, BILIRUBINUR, KETONESUR, PROTEINUR, UROBILINOGEN, NITRITE, LEUKOCYTESUR in the last 72 hours.  Invalid input(s): APPERANCEUR    Imaging: No results found.   Medications:     . allopurinol  100 mg Oral Daily  . calcium acetate  1,334 mg Oral TID WC  .  cinacalcet  90 mg Oral BID WC  . clonazePAM  0.5 mg Oral QHS  . [START ON 10/02/2015] epoetin (EPOGEN/PROCRIT) injection  10,000 Units Intravenous Q M,W,F-HD  . furosemide  40 mg Oral Q T,Th,S,Su  . heparin subcutaneous  5,000 Units Subcutaneous Q8H  . hydrALAZINE  25 mg Oral BID  . metoprolol  50 mg Oral BID  . ramipril  10 mg Oral QHS  . sevelamer carbonate  2,400 mg Oral TID WC  . sodium chloride flush  3 mL Intravenous Q12H  . vancomycin  750 mg Intravenous Q M,W,F-HD   acetaminophen **OR** acetaminophen, colchicine, HYDROcodone-acetaminophen, labetalol, morphine injection, nitroGLYCERIN, ondansetron **OR** ondansetron (ZOFRAN) IV  Assessment/ Plan:  65 y.o. male with pmhx of ESRD, cardiomyopathy, hypertension, anemia of ckd, shpth, e. coli bacteremia 9/12, left hand inflammatory arthritis resolved, angioplasty cephalic vein [id number], access declot 9/15. colonoscopy 02/2015, admitted for fever/chills 09/28/15.  [facility]/MWF  1. End-stage renal disease on hemodialysis Monday, Wednesday, Friday.  Patient completed hemodialysis yesterday. No acute indication for dialysis today. We will plan to perform dialysis tomorrow using his left upper extremity AV graft. If this is functional he may likely be discharged tomorrow.  2. Fevers. Blood cultures remain negative at this time. We will maintain the patient on vancomycin for now.  3. Anemia of chronic kidney disease. Hemoglobin 9.1 at last check. Continue Epogen 10,000 units IV with dialysis.  4. Secondary hyperparathyroidism. Phosphorus 6.5.  Continue Renvela as well as Sensipar.  Continue to periodically monitor phosphorus.     LOS: 3 Nusayba Cadenas 5/25/201711:00 AM

## 2015-10-02 DIAGNOSIS — R778 Other specified abnormalities of plasma proteins: Secondary | ICD-10-CM

## 2015-10-02 DIAGNOSIS — R7989 Other specified abnormal findings of blood chemistry: Secondary | ICD-10-CM

## 2015-10-02 DIAGNOSIS — T8249XA Other complication of vascular dialysis catheter, initial encounter: Secondary | ICD-10-CM

## 2015-10-02 LAB — CBC
HCT: 27.8 % — ABNORMAL LOW (ref 40.0–52.0)
Hemoglobin: 8.7 g/dL — ABNORMAL LOW (ref 13.0–18.0)
MCH: 27.6 pg (ref 26.0–34.0)
MCHC: 31.3 g/dL — ABNORMAL LOW (ref 32.0–36.0)
MCV: 88.1 fL (ref 80.0–100.0)
PLATELETS: 164 10*3/uL (ref 150–440)
RBC: 3.15 MIL/uL — ABNORMAL LOW (ref 4.40–5.90)
RDW: 17.3 % — AB (ref 11.5–14.5)
WBC: 11.4 10*3/uL — AB (ref 3.8–10.6)

## 2015-10-02 LAB — BASIC METABOLIC PANEL
Anion gap: 12 (ref 5–15)
BUN: 54 mg/dL — AB (ref 6–20)
CALCIUM: 8 mg/dL — AB (ref 8.9–10.3)
CO2: 25 mmol/L (ref 22–32)
CREATININE: 11.56 mg/dL — AB (ref 0.61–1.24)
Chloride: 102 mmol/L (ref 101–111)
GFR calc Af Amer: 5 mL/min — ABNORMAL LOW (ref >60)
GFR, EST NON AFRICAN AMERICAN: 4 mL/min — AB (ref >60)
Glucose, Bld: 79 mg/dL (ref 65–99)
Potassium: 4.6 mmol/L (ref 3.5–5.1)
SODIUM: 139 mmol/L (ref 135–145)

## 2015-10-02 NOTE — Discharge Summary (Signed)
[facility] NAME: Johnny [name]    MR#:  [id number]  DATE OF BIRTH:  Dec 22, 1950  DATE OF ADMISSION:  09/28/2015 ADMITTING PHYSICIAN: Lance Coon, MD  DATE OF DISCHARGE: 10/02/2015  2:38 PM  PRIMARY CARE PHYSICIAN: [name], MD     ADMISSION DIAGNOSIS:  NSTEMI (non-ST elevated myocardial infarction) ([place]) [I21.4] Chest pain, unspecified chest pain type [R07.9]  DISCHARGE DIAGNOSIS:  Principal Problem:   Musculoskeletal chest pain Active Problems:   Chronic diastolic CHF (congestive heart failure) (HCC)   Elevated troponin   Complications, dialysis, catheter, mechanical (HCC)   ESRD on dialysis ([place])   HTN (hypertension)   Gout   SECONDARY DIAGNOSIS:   Past Medical History  Diagnosis Date  . Hypertension   . Chronic kidney disease   . Gout   . Hyperlipidemia   . CHF (congestive heart failure) ([place])   . Anemia     .pro HOSPITAL COURSE:   The patient is 65 year old male with past medical history significant for ESRD, HTN, Hyperlipidemia, gout, anemia of CKD who presented with complaints of Chest pain. In ER he was noted to have minimal changes of T waves,  Mild elevation of troponin, patient was admitted. He was initiated on heparin intravenously and Echo was performed, which was found to be normal. However, it appeared that patient had been febrile, chilly. His hemodialysis catheter was removed. Blood , catheter tip culture remained negative. Patient received a few doses of vancomycin while awaiting for culture results, but then it was felt that vancomycin should not be continued due to negative blood cultures. The patient underwent Left brachial artery to axillary vein arteriovenous graft cannulation under ultrasound guidance,Left arm shuntogram, Percutaneous transluminal angioplasty of left innominate vein with 12 mm diameter by 6 cm length angioplasty balloon, Removal of left jugular PermaCath due to  malfunctioning left arm arteriovenous graft with marked arm swelling . He had dialysis done via AV shunt 10/02/15 and was felt to be stable to be discharged home. 1. Fever, likely infected dialysis catheter, now removed, no bacteremia noted, patient received few doses of antibiotics, not recommended to be continued, discussed with DR Lateef 2. Dialysis catheter complications, s/p PTCA [id number] by DR Lucky Cowboy, patient had dialysis on the day of discharge via AV shunt, no complications, uneventful 3. Musculoskeletal chest pain, likely due to infection, resolved by the day of discharge, normal echo 4. Essential HTN, well controled, continue home medications. DISCHARGE CONDITIONS:   stable  CONSULTS OBTAINED:  Treatment Team:  Teodoro Spray, MD Anthonette Legato, MD Nicholes Mango, MD  DRUG ALLERGIES:   Allergies  Allergen Reactions  . Shellfish Allergy Anaphylaxis    DISCHARGE MEDICATIONS:   Discharge Medication List as of 10/02/2015  2:15 PM    CONTINUE these medications which have CHANGED   Details  oxyCODONE-acetaminophen (ROXICET) 5-325 MG tablet Take 1 tablet by mouth every 6 (six) hours as needed for moderate pain or severe pain., Starting 09/29/2015, Until Discontinued, Print      CONTINUE these medications which have NOT CHANGED   Details  allopurinol (ZYLOPRIM) 100 MG tablet Take 100 mg by mouth daily., Until Discontinued, Historical Med    calcium acetate (PHOSLO) 667 MG capsule Take 1,334 mg by mouth 3 (three) times daily with meals., Until Discontinued, Historical Med    cinacalcet (SENSIPAR) 90 MG tablet Take 90 mg by mouth 2 (two) times daily. , Until Discontinued, Historical Med    clonazePAM (  KLONOPIN) 0.5 MG tablet Take 0.5 mg by mouth at bedtime., Until Discontinued, Historical Med    colchicine 0.6 MG tablet Take 0.6 mg by mouth daily as needed (for gout flares). , Until Discontinued, Historical Med    folic acid-vitamin b complex-vitamin c-selenium-zinc (DIALYVITE) 3 MG  TABS tablet Take 1 tablet by mouth every Monday, Wednesday, and Friday., Until Discontinued, Historical Med    furosemide (LASIX) 40 MG tablet Take 40 mg by mouth every Tuesday, Thursday, Saturday, and Sunday. , Until Discontinued, Historical Med    hydrALAZINE (APRESOLINE) 25 MG tablet Take 25 mg by mouth 2 (two) times daily. , Until Discontinued, Historical Med    metoprolol (LOPRESSOR) 50 MG tablet Take 50 mg by mouth 2 (two) times daily., Until Discontinued, Historical Med    ramipril (ALTACE) 10 MG capsule Take 10 mg by mouth at bedtime. , Until Discontinued, Historical Med    sevelamer carbonate (RENVELA) 800 MG tablet Take 2,400 mg by mouth 3 (three) times daily with meals. , Until Discontinued, Historical Med         DISCHARGE INSTRUCTIONS:    Patient is to folow up with PCP, nephrology, hd center as previously schedulled  If you experience worsening of your admission symptoms, develop shortness of breath, life threatening emergency, suicidal or homicidal thoughts you must seek medical attention immediately by calling 911 or calling your MD immediately  if symptoms less severe.  You Must read complete instructions/literature along with all the possible adverse reactions/side effects for all the Medicines you take and that have been prescribed to you. Take any new Medicines after you have completely understood and accept all the possible adverse reactions/side effects.   Please note  You were cared for by a hospitalist during your hospital stay. If you have any questions about your discharge medications or the care you received while you were in the hospital after you are discharged, you can call the unit and asked to speak with the hospitalist on call if the hospitalist that took care of you is not available. Once you are discharged, your primary care physician will handle any further medical issues. Please note that NO REFILLS for any discharge medications will be authorized once  you are discharged, as it is imperative that you return to your primary care physician (or establish a relationship with a primary care physician if you do not have one) for your aftercare needs so that they can reassess your need for medications and monitor your lab values.    Today   CHIEF COMPLAINT:   Chief Complaint  Patient presents with  . Fever    HISTORY OF PRESENT ILLNESS:  Johnny [name]  is a 65 y.o. male with a known history of ESRD, HTN, Hyperlipidemia, gout, anemia of CKD who presented with complaints of Chest pain. In ER he was noted to have minimal changes of T waves,  Mild elevation of troponin, patient was admitted. He was initiated on heparin intravenously and Echo was performed, which was found to be normal. However, it appeared that patient had been febrile, chilly. His hemodialysis catheter was removed. Blood , catheter tip culture remained negative. Patient received a few doses of vancomycin while awaiting for culture results, but then it was felt that vancomycin should not be continued due to negative blood cultures. The patient underwent Left brachial artery to axillary vein arteriovenous graft cannulation under ultrasound guidance,Left arm shuntogram, Percutaneous transluminal angioplasty of left innominate vein with 12 mm diameter by 6 cm length angioplasty  balloon, Removal of left jugular PermaCath due to malfunctioning left arm arteriovenous graft with marked arm swelling . He had dialysis done via AV shunt 10/02/15 and was felt to be stable to be discharged home. 1. Fever, likely infected dialysis catheter, now removed, no bacteremia noted, patient received few doses of antibiotics, not recommended to be continued, discussed with DR Lateef 2. Dialysis catheter complications, s/p PTCA [id number] by DR Lucky Cowboy, patient had dialysis on the day of discharge via AV shunt, no complications, uneventful 3. Musculoskeletal chest pain, likely due to infection, resolved by the day of  discharge, normal echo 4. Essential HTN, well controled, continue home medications.    VITAL SIGNS:  Blood pressure 115/61, pulse 72, temperature 97.4 F (36.3 C), temperature source Oral, resp. rate 21, height 5\' 8"  (1.727 m), weight 90.7 kg (199 lb 15.3 oz), SpO2 100 %.  I/O:   Intake/Output Summary (Last 24 hours) at 10/02/15 1740 Last data filed at 10/02/15 1331  Gross per 24 hour  Intake      0 ml  Output   2500 ml  Net  -2500 ml    PHYSICAL EXAMINATION:  GENERAL:  65 y.o.-year-old patient lying in the bed with no acute distress.  EYES: Pupils equal, round, reactive to light and accommodation. No scleral icterus. Extraocular muscles intact.  HEENT: Head atraumatic, normocephalic. Oropharynx and nasopharynx clear.  NECK:  Supple, no jugular venous distention. No thyroid enlargement, no tenderness.  LUNGS: Normal breath sounds bilaterally, no wheezing, rales,rhonchi or crepitation. No use of accessory muscles of respiration. Left upper chest dialysis catheter entrance site is covered with gauze and Duoderm, no swelling, bleeding, drainage, pain on palpation CARDIOVASCULAR: S1, S2 normal. No murmurs, rubs, or gallops.  ABDOMEN: Soft, non-tender, non-distended. Bowel sounds present. No organomegaly or mass.  EXTREMITIES: No pedal edema, cyanosis, or clubbing. AV shunt in LUE, receiving dialysis during my evaluation. NEUROLOGIC: Cranial nerves II through XII are intact. Muscle strength 5/5 in all extremities. Sensation intact. Gait not checked.  PSYCHIATRIC: The patient is alert and oriented x 3.  SKIN: No obvious rash, lesion, or ulcer.   DATA REVIEW:   CBC  Recent Labs Lab 10/02/15 0512  WBC 11.4*  HGB 8.7*  HCT 27.8*  PLT 164    Chemistries   Recent Labs Lab 10/02/15 0512  NA 139  K 4.6  CL 102  CO2 25  GLUCOSE 79  BUN 54*  CREATININE 11.56*  CALCIUM 8.0*    Cardiac Enzymes  Recent Labs Lab 09/29/15 1118  TROPONINI 0.06*    Microbiology Results   Results for orders placed or performed during the hospital encounter of 09/28/15  CULTURE, BLOOD (ROUTINE X 2) w Reflex to ID Panel     Status: None (Preliminary result)   Collection Time: 09/29/15 11:18 AM  Result Value Ref Range Status   Specimen Description BLOOD RIGHT AC  Final   Special Requests   Final    BOTTLES DRAWN AEROBIC AND ANAEROBIC AER 10ML ANA 5ML   Culture NO GROWTH 3 DAYS  Final   Report Status PENDING  Incomplete  CULTURE, BLOOD (ROUTINE X 2) w Reflex to ID Panel     Status: None (Preliminary result)   Collection Time: 09/29/15 11:23 AM  Result Value Ref Range Status   Specimen Description BLOOD RIGHT WRIST  Final   Special Requests   Final    BOTTLES DRAWN AEROBIC AND ANAEROBIC AER 8ML ANA 4ML   Culture NO GROWTH 3 DAYS  Final  Report Status PENDING  Incomplete  Cath Tip Culture     Status: None (Preliminary result)   Collection Time: 09/30/15  2:58 PM  Result Value Ref Range Status   Specimen Description CATH TIP  Final   Special Requests NONE  Final   Culture NO GROWTH 2 DAYS  Final   Report Status PENDING  Incomplete    RADIOLOGY:  No results found.  EKG:   Orders placed or performed during the hospital encounter of 09/28/15  . ED EKG  . ED EKG  . EKG 12-Lead  . EKG 12-Lead      Management plans discussed with the patient, family and they are in agreement.  CODE STATUS:  Code Status History    Date Active Date Inactive Code Status Order ID Comments User Context   09/28/2015 11:06 PM 10/02/2015  5:38 PM Full Code [id number]  Lance Coon, MD Inpatient      TOTAL TIME TAKING CARE OF THIS PATIENT: 40  minutes.    Theodoro Grist M.D on 10/02/2015 at 5:40 PM  Between 7am to 6pm - Pager - [phone]  After 6pm go to [email or web address] - password [email or web address] [facility]  Office  [phone]  CC: Primary care physician; [name], MD

## 2015-10-02 NOTE — Progress Notes (Signed)
Per Dr. Clayton Bibles okay for patient to discharge home. Patient received discharge instructions, pt verbalized understanding. IVs was removed with no signs of infection. Dressing clean, dry intact. No skin tears or wounds present. Prescription was printed and given to patient. Patient was escorted out with staff member via wheelchair via private auto. Patient had no complaints after dialysis. No further needs from care management team.

## 2015-10-02 NOTE — Progress Notes (Signed)
Pre Dialysis 

## 2015-10-02 NOTE — Progress Notes (Signed)
Dialysis started 

## 2015-10-02 NOTE — Progress Notes (Signed)
[facility]  ROUNDING NOTE   Subjective:  Patient seen and evaluated during hemodialysis.  He appears to be toleratingwell. His left upper extremity AV graft is functioning well.   Objective:  Vital signs in last 24 hours:  Temp:  [97.5 F (36.4 C)-97.9 F (36.6 C)] 97.5 F (36.4 C) (05/26 0946) Pulse Rate:  [61-81] 64 (05/26 1130) Resp:  [14-24] 17 (05/26 1130) BP: (105-180)/(57-75) 139/65 mmHg (05/26 1130) SpO2:  [96 %-100 %] 97 % (05/26 1130) Weight:  [90.402 kg (199 lb 4.8 oz)-93.2 kg (205 lb 7.5 oz)] 93.2 kg (205 lb 7.5 oz) (05/26 0946)  Weight change: -1.398 kg (-3 lb 1.3 oz) Filed Weights   10/01/15 0415 10/02/15 0551 10/02/15 0946  Weight: 91.037 kg (200 lb 11.2 oz) 90.402 kg (199 lb 4.8 oz) 93.2 kg (205 lb 7.5 oz)    Intake/Output:     Intake/Output this shift:     Physical Exam: General: NAD, laying comfortably in bed  Head: Normocephalic, atraumatic. Moist oral mucosal membranes  Eyes: Anicteric  Neck: Supple, trachea midline  Lungs:  Clear to auscultation normal effort  Heart: Regular rate and rhythm no rubs  Abdomen:  Soft, nontender, BS present  Extremities: trace peripheral edema.  Neurologic: Nonfocal, moving all four extremities  Skin: No lesions  Access: Left UE AVG    Basic Metabolic Panel:  Recent Labs Lab 09/28/15 1630 09/29/15 0448 09/30/15 1000 10/01/15 0515 10/02/15 0512  NA 140 137  --  138 139  K 5.3* 5.0  --  4.9 4.6  CL 107 105  --  101 102  CO2 21* 22  --  24 25  GLUCOSE 69 148*  --  107* 79  BUN 26* 40*  --  43* 54*  CREATININE 8.51* 9.81*  --  9.64* 11.56*  CALCIUM 9.8 9.7  --  8.5* 8.0*  PHOS  --   --  6.5*  --   --     Liver Function Tests: No results for input(s): AST, ALT, ALKPHOS, BILITOT, PROT, ALBUMIN in the last 168 hours. No results for input(s): LIPASE, AMYLASE in the last 168 hours. No results for input(s): AMMONIA in the last 168 hours.  CBC:  Recent Labs Lab 09/28/15 1630  09/29/15 0450 10/01/15 0515 10/02/15 0512  WBC 7.5 8.0 14.8* 11.4*  NEUTROABS 5.4  --  13.0*  --   HGB 9.9* 8.9* 9.1* 8.7*  HCT 31.0* 27.9* 29.0* 27.8*  MCV 87.1 88.2 87.5 88.1  PLT 135* 140* 194 164    Cardiac Enzymes:  Recent Labs Lab 09/28/15 1630 09/28/15 2344 09/29/15 0448 09/29/15 1118  TROPONINI 0.07* 0.06* 0.05* 0.06*    BNP: Invalid input(s): POCBNP  CBG: No results for input(s): GLUCAP in the last 168 hours.  Microbiology: Results for orders placed or performed during the hospital encounter of 09/28/15  CULTURE, BLOOD (ROUTINE X 2) w Reflex to ID Panel     Status: None (Preliminary result)   Collection Time: 09/29/15 11:18 AM  Result Value Ref Range Status   Specimen Description BLOOD RIGHT AC  Final   Special Requests   Final    BOTTLES DRAWN AEROBIC AND ANAEROBIC AER 10ML ANA 5ML   Culture NO GROWTH 2 DAYS  Final   Report Status PENDING  Incomplete  CULTURE, BLOOD (ROUTINE X 2) w Reflex to ID Panel     Status: None (Preliminary result)   Collection Time: 09/29/15 11:23 AM  Result Value Ref Range Status   Specimen Description  BLOOD RIGHT WRIST  Final   Special Requests   Final    BOTTLES DRAWN AEROBIC AND ANAEROBIC AER 8ML ANA 4ML   Culture NO GROWTH 2 DAYS  Final   Report Status PENDING  Incomplete  Cath Tip Culture     Status: None (Preliminary result)   Collection Time: 09/30/15  2:58 PM  Result Value Ref Range Status   Specimen Description CATH TIP  Final   Special Requests NONE  Final   Culture NO GROWTH 2 DAYS  Final   Report Status PENDING  Incomplete    Coagulation Studies: No results for input(s): LABPROT, INR in the last 72 hours.  Urinalysis: No results for input(s): COLORURINE, LABSPEC, PHURINE, GLUCOSEU, HGBUR, BILIRUBINUR, KETONESUR, PROTEINUR, UROBILINOGEN, NITRITE, LEUKOCYTESUR in the last 72 hours.  Invalid input(s): APPERANCEUR    Imaging: No results found.   Medications:     . allopurinol  100 mg Oral Daily  .  calcium acetate  1,334 mg Oral TID WC  . cinacalcet  90 mg Oral BID WC  . clonazePAM  0.5 mg Oral QHS  . epoetin (EPOGEN/PROCRIT) injection  10,000 Units Intravenous Q M,W,F-HD  . furosemide  40 mg Oral Q T,Th,S,Su  . heparin subcutaneous  5,000 Units Subcutaneous Q8H  . hydrALAZINE  25 mg Oral BID  . metoprolol  50 mg Oral BID  . ramipril  10 mg Oral QHS  . sevelamer carbonate  2,400 mg Oral TID WC  . sodium chloride flush  3 mL Intravenous Q12H  . vancomycin  750 mg Intravenous Q M,W,F-HD   acetaminophen **OR** acetaminophen, colchicine, HYDROcodone-acetaminophen, labetalol, morphine injection, nitroGLYCERIN, ondansetron **OR** ondansetron (ZOFRAN) IV  Assessment/ Plan:  65 y.o. male with pmhx of ESRD, cardiomyopathy, hypertension, anemia of ckd, shpth, e. coli bacteremia 9/12, left hand inflammatory arthritis resolved, angioplasty cephalic vein [id number], access declot 9/15. colonoscopy 02/2015, admitted for fever/chills 09/28/15.  [facility]/MWF  1. End-stage renal disease on hemodialysis Monday, Wednesday, Friday.  Patient seen and evaluated during dialysis.  His left upper extremity AV graft appears to be functioning well.  Complete dialysis today.  2. Fevers. Blood cultures and catheter tip culture are all negative.  Therefore we don't feel he needs any further antibiotic treatment as it was empiric in nature  3. Anemia of chronic kidney disease. continue Epogen with dialysis.  4. Secondary hyperparathyroidism. Continue to monitor PTH, calcium, and phosphorus as an outpatient.  Continue the patient on Renvela as well as Sensipar.    LOS: 4 Johnny [name] 5/26/201711:46 AM

## 2015-10-04 LAB — CULTURE, BLOOD (ROUTINE X 2)
CULTURE: NO GROWTH
Culture: NO GROWTH

## 2015-10-04 LAB — CATH TIP CULTURE: CULTURE: NO GROWTH

## 2015-12-02 ENCOUNTER — Other Ambulatory Visit: Payer: Self-pay | Admitting: Vascular Surgery

## 2015-12-02 ENCOUNTER — Other Ambulatory Visit
Admission: RE | Admit: 2015-12-02 | Discharge: 2015-12-02 | Disposition: A | Discharge status: Home / Self Care | Payer: Medicare Other | Source: Ambulatory Visit | Attending: Internal Medicine | Admitting: Internal Medicine

## 2015-12-02 DIAGNOSIS — N185 Chronic kidney disease, stage 5: Secondary | ICD-10-CM | POA: Insufficient documentation

## 2015-12-02 LAB — POTASSIUM: Potassium: 4.5 mmol/L (ref 3.5–5.1)

## 2015-12-03 ENCOUNTER — Encounter
Admission: RE | Disposition: A | Discharge status: Home / Self Care | Payer: Self-pay | Source: Ambulatory Visit | Attending: Vascular Surgery

## 2015-12-03 ENCOUNTER — Ambulatory Visit
Admission: RE | Admit: 2015-12-03 | Discharge: 2015-12-03 | Disposition: A | Discharge status: Home / Self Care | Payer: Medicare Other | Source: Ambulatory Visit | Attending: Vascular Surgery | Admitting: Vascular Surgery

## 2015-12-03 DIAGNOSIS — I132 Hypertensive heart and chronic kidney disease with heart failure and with stage 5 chronic kidney disease, or end stage renal disease: Secondary | ICD-10-CM | POA: Insufficient documentation

## 2015-12-03 DIAGNOSIS — Z91013 Allergy to seafood: Secondary | ICD-10-CM | POA: Diagnosis not present

## 2015-12-03 DIAGNOSIS — I509 Heart failure, unspecified: Secondary | ICD-10-CM | POA: Insufficient documentation

## 2015-12-03 DIAGNOSIS — D649 Anemia, unspecified: Secondary | ICD-10-CM | POA: Diagnosis not present

## 2015-12-03 DIAGNOSIS — Y832 Surgical operation with anastomosis, bypass or graft as the cause of abnormal reaction of the patient, or of later complication, without mention of misadventure at the time of the procedure: Secondary | ICD-10-CM | POA: Insufficient documentation

## 2015-12-03 DIAGNOSIS — T82858A Stenosis of vascular prosthetic devices, implants and grafts, initial encounter: Secondary | ICD-10-CM | POA: Insufficient documentation

## 2015-12-03 DIAGNOSIS — E785 Hyperlipidemia, unspecified: Secondary | ICD-10-CM | POA: Insufficient documentation

## 2015-12-03 DIAGNOSIS — Z992 Dependence on renal dialysis: Secondary | ICD-10-CM | POA: Insufficient documentation

## 2015-12-03 DIAGNOSIS — M109 Gout, unspecified: Secondary | ICD-10-CM | POA: Insufficient documentation

## 2015-12-03 DIAGNOSIS — Z87891 Personal history of nicotine dependence: Secondary | ICD-10-CM | POA: Diagnosis not present

## 2015-12-03 DIAGNOSIS — N186 End stage renal disease: Secondary | ICD-10-CM | POA: Insufficient documentation

## 2015-12-03 DIAGNOSIS — Z8249 Family history of ischemic heart disease and other diseases of the circulatory system: Secondary | ICD-10-CM | POA: Diagnosis not present

## 2015-12-03 HISTORY — PX: PERIPHERAL VASCULAR CATHETERIZATION: SHX172C

## 2015-12-03 LAB — POTASSIUM (ARMC VASCULAR LAB ONLY): Potassium (ARMC vascular lab): 4.8 (ref 3.5–5.1)

## 2015-12-03 SURGERY — THROMBECTOMY
Anesthesia: Moderate Sedation | Site: Arm Upper | Laterality: Left

## 2015-12-03 MED ORDER — ASPIRIN EC 81 MG PO TBEC: 81.0000 mg | DELAYED_RELEASE_TABLET | Freq: Every day | ORAL | 2 refills | Status: DC

## 2015-12-03 MED ORDER — HYDROMORPHONE HCL 1 MG/ML IJ SOLN: 1.0000 mg | Freq: Once | INTRAMUSCULAR | Status: DC

## 2015-12-03 MED ORDER — IOPAMIDOL (ISOVUE-300) INJECTION 61%
INTRAVENOUS | Status: DC | PRN
  Administered 2015-12-03: 65 mL via INTRA_ARTERIAL

## 2015-12-03 MED ORDER — CLOPIDOGREL BISULFATE 75 MG PO TABS: 75.0000 mg | ORAL_TABLET | Freq: Once | ORAL | Status: DC

## 2015-12-03 MED ORDER — METHYLPREDNISOLONE SODIUM SUCC 125 MG IJ SOLR
125.0000 mg | INTRAMUSCULAR | Status: DC | PRN
  Administered 2015-12-03: 125 mg via INTRAVENOUS

## 2015-12-03 MED ORDER — ALTEPLASE 2 MG IJ SOLR
INTRAMUSCULAR | Status: AC
  Filled 2015-12-03: qty 4

## 2015-12-03 MED ORDER — CLOPIDOGREL BISULFATE 75 MG PO TABS
ORAL_TABLET | ORAL | Status: AC
  Filled 2015-12-03: qty 1

## 2015-12-03 MED ORDER — HEPARIN SODIUM (PORCINE) 1000 UNIT/ML IJ SOLN
INTRAMUSCULAR | Status: DC | PRN
  Administered 2015-12-03: 4000 [IU] via INTRAVENOUS

## 2015-12-03 MED ORDER — SODIUM CHLORIDE 0.9 % IV SOLN
INTRAVENOUS | Status: DC
  Administered 2015-12-03: 15:00:00 via INTRAVENOUS

## 2015-12-03 MED ORDER — MIDAZOLAM HCL 2 MG/2ML IJ SOLN
INTRAMUSCULAR | Status: AC
  Filled 2015-12-03: qty 4

## 2015-12-03 MED ORDER — DEXTROSE 5 % IV SOLN
INTRAVENOUS | Status: AC
  Filled 2015-12-03: qty 1.5

## 2015-12-03 MED ORDER — FENTANYL CITRATE (PF) 100 MCG/2ML IJ SOLN
INTRAMUSCULAR | Status: AC
  Filled 2015-12-03: qty 2

## 2015-12-03 MED ORDER — ONDANSETRON HCL 4 MG/2ML IJ SOLN: 4.0000 mg | Freq: Four times a day (QID) | INTRAMUSCULAR | Status: DC | PRN

## 2015-12-03 MED ORDER — FENTANYL CITRATE (PF) 100 MCG/2ML IJ SOLN
INTRAMUSCULAR | Status: DC | PRN
  Administered 2015-12-03: 50 ug via INTRAVENOUS
  Administered 2015-12-03: 25 ug via INTRAVENOUS

## 2015-12-03 MED ORDER — FAMOTIDINE 20 MG PO TABS
40.0000 mg | ORAL_TABLET | ORAL | Status: DC | PRN
  Administered 2015-12-03: 40 mg via ORAL

## 2015-12-03 MED ORDER — HEPARIN SODIUM (PORCINE) 1000 UNIT/ML IJ SOLN
INTRAMUSCULAR | Status: AC
  Filled 2015-12-03: qty 1

## 2015-12-03 MED ORDER — ALTEPLASE 2 MG IJ SOLR
INTRAMUSCULAR | Status: DC | PRN
  Administered 2015-12-03: 4 mg

## 2015-12-03 MED ORDER — METHYLPREDNISOLONE SODIUM SUCC 125 MG IJ SOLR
INTRAMUSCULAR | Status: AC
  Administered 2015-12-03: 125 mg via INTRAVENOUS
  Filled 2015-12-03: qty 2

## 2015-12-03 MED ORDER — HEPARIN (PORCINE) IN NACL 2-0.9 UNIT/ML-% IJ SOLN
INTRAMUSCULAR | Status: AC
  Filled 2015-12-03: qty 1000

## 2015-12-03 MED ORDER — MIDAZOLAM HCL 2 MG/2ML IJ SOLN
INTRAMUSCULAR | Status: DC | PRN
  Administered 2015-12-03: 2 mg via INTRAVENOUS
  Administered 2015-12-03: 1 mg via INTRAVENOUS

## 2015-12-03 MED ORDER — CLOPIDOGREL BISULFATE 75 MG PO TABS: 75.0000 mg | ORAL_TABLET | Freq: Every day | ORAL | 11 refills | Status: DC

## 2015-12-03 MED ORDER — LIDOCAINE HCL (PF) 1 % IJ SOLN
INTRAMUSCULAR | Status: AC
  Filled 2015-12-03: qty 30

## 2015-12-03 MED ORDER — FAMOTIDINE 20 MG PO TABS
ORAL_TABLET | ORAL | Status: AC
  Administered 2015-12-03: 40 mg via ORAL
  Filled 2015-12-03: qty 1

## 2015-12-03 SURGICAL SUPPLY — 17 items
BALLN DORADO 7X120X80 (BALLOONS) ×2 IMPLANT
BALLN ULTRVRSE 8X60X75C (BALLOONS) ×2
BALLOON ULTRVRSE 8X60X75C (BALLOONS) ×1 IMPLANT
CANNULA 5F STIFF (CANNULA) ×2 IMPLANT
CATH EMBOLECTOMY 5FR (BALLOONS) ×2 IMPLANT
CATH TORCON 5FR 0.38 (CATHETERS) ×2 IMPLANT
DEVICE PRESTO INFLATION (MISCELLANEOUS) ×2 IMPLANT
GLIDEWIRE ADV .035X180CM (WIRE) ×2 IMPLANT
PACK ANGIOGRAPHY (CUSTOM PROCEDURE TRAY) ×2 IMPLANT
SET AVX THROMB ULT (MISCELLANEOUS) ×2 IMPLANT
SHEATH BRITE TIP 6FRX5.5 (SHEATH) ×4 IMPLANT
SHEATH BRITE TIP 7FRX5.5 (SHEATH) ×2 IMPLANT
STENT VIABAHN 8X50X120 (Permanent Stent) ×4 IMPLANT
SUT MNCRL AB 4-0 PS2 18 (SUTURE) ×2 IMPLANT
TOWEL OR 17X26 4PK STRL BLUE (TOWEL DISPOSABLE) ×2 IMPLANT
WIRE G 018X200 V18 (WIRE) ×2 IMPLANT
WIRE MAGIC TOR.035 180C (WIRE) ×4 IMPLANT

## 2015-12-03 NOTE — Op Note (Signed)
[facility]    OPERATIVE NOTE   PROCEDURE: 1.  Left brachial artery to axillary vein arteriovenous graft cannulation under ultrasound guidance in both a retrograde and then antegrade fashion crossing 2.  Left arm shuntogram and central venogram 3.  Catheter directed thrombolysis with 4 mg of TPA delivered with the AngioJet AVX catheter 4.  Mechanical rheolytic thrombectomy to the left arm AV graft and axillary vein with the AngioJet AVX catheter 5.  Fogarty embolectomy for residual arterial plug 6.  Percutaneous transluminal angioplasty of venous anastomosis with 7 mm diameter by 12 cm length high pressure angioplasty balloon 7.  Percutaneous transluminal angioplasty of the mid graft with 7 mm diameter by 12 cm length high pressure angioplasty balloon 8.  Viabahn covered stent placement to the venous anastomosis for residual stenosis and thrombus after angioplasty with an 8 mm diameter by 5 cm length stent 9.  Viabahn covered stent placement to the mid graft for residual stenosis and thrombus after angioplasty with 67m diameter by 5 cm length stent  PRE-OPERATIVE DIAGNOSIS: 1. ESRD 2.  Thrombosed left brachial artery to axillary vein arteriovenous graft  POST-OPERATIVE DIAGNOSIS: same as above   SURGEON: JLeotis Pain MD  ANESTHESIA: local with Moderate Conscious Sedation for approximately 45 minutes using 3 mg of Versed and 75 mcg of Fentanyl  ESTIMATED BLOOD LOSS: Minimal  FINDING(S): 1. Near occlusive venous anastomotic stenosis with thrombosis of the graft  SPECIMEN(S):  None  CONTRAST: 65 cc  FLUORO TIME:  7.4 minutes  INDICATIONS: Patient is a 65y.o.male who presents with a thrombosed left brachial artery to axillary vein arteriovenous graft.  The patient is scheduled for an attempted declot and shuntogram.  The patient is aware the risks include but are not limited to: bleeding, infection, thrombosis of the cannulated access, and possible  anaphylactic reaction to the contrast.  The patient is aware of the risks of the procedure and elects to proceed forward.  DESCRIPTION: After full informed written consent was obtained, the patient was brought back to the angiography suite and placed supine upon the angiography table.  The patient was connected to monitoring equipment. Moderate conscious sedation was administered with a face to face encounter with the patient throughout the procedure with my supervision of the RN administering medicines and monitoring the patient's vital signs, pulse oximetry, telemetry and mental status throughout from the start of the procedure until the patient was taken to the recovery room. The left arm was prepped and draped in the standard fashion for a percutaneous access intervention.  Under ultrasound guidance, the left brachial artery to axillary vein arteriovenous graft was cannulated with a micropuncture needle under direct ultrasound guidance due to the pulseless nature of the graft in both an antegrade and a retrograde fashion crossing, and permanent images were performed.  The microwire was advanced and the needle was exchanged for the a microsheath.  I then upsized to a 6 Fr Sheath and imaging was performed.  Hand injections were completed to image the access including the central venous system. This demonstrated no flow within the AV graft.  Based on the images, this patient will need extensive treatment to salvage the graft. I then gave the patient 4000 units of intravenous heparin.  I then placed a Magic torque wire into the brachial artery from the retrograde sheath and into the axillary vein from the antegrade sheath. A Kumpe catheter and the advantage wire were required to get into the axillary vein due to the  near occlusive stenosis at the venous anastomosis. 4 mg of TPA were deployed throughout the entirety of the graft and into the axillary vein. This was allowed to dwell for 10-15 minutes. Mechanical  rheolytic thrombectomy was then performed throughout the graft and into the axillary vein. This uncovered significant residual thrombus in the mid graft that was flow-limiting as well as stenosis and thrombus at the venous anastomosis that also appeared flow limiting.  A residual arterial plug was also seen at the arterial anastomosis. An attempt to clear the arterial plug was done with 3 passes of the Fogarty embolectomy balloon. This resulted in resolution of the arterial plug, and clearance of the arterial side of the graft. The arterial outflow was seen to be intact through the radial and ulnar arteries as well on these images. The retrograde sheath was removed. I then turned my attention to the thrombus in the distal graft and the axillary vein. Mechanical rheolytic thrombectomy was performed. This resulted in no change from the above findings.  I then elected to treat this with angioplasty. I used a 7 mm diameter by 12 cm length high pressure angioplasty balloon first at the venous anastomosis and into the axillary vein. This was inflated to 14 atm for 1 minute. Was then used to treat the mid and distal graft and inflated to 20 atm for 1 minute. Completion ol angiogram flowing this showed very little improvement and so I elected to place covered stents..  As these areas were significantly separated, I elected to place 2 shorter covered stents. I upsized to a 7 [place] sheath and exchanged for a 0.018 wire. The venous anastomotic stent was deployed with its proximal extent about 1-1/2 cm into the axillary vein. This was an 8 mm diameter by 5 cm length stent This encompassed the lesion and thrombus. It was postdilated with an 8 mm balloon. The mid graft lesion was also treated with an 8 mm diameter by 5 cm length stent. This was postdilated with an 8 mm balloon as well. After these treatments, no significant residual thrombus or stenosis was identified and brisk flow was seen through the graft.   Based on the  completion imaging, no further intervention is necessary.  The wire and balloon were removed from the sheath.  A 4-0 Monocryl purse-string suture was sewn around the sheath.  The sheath was removed while tying down the suture.  A sterile bandage was applied to the puncture site.  COMPLICATIONS: None  CONDITION: Stable   Tabetha Haraway 12/03/2015 4:37 PM

## 2015-12-03 NOTE — H&P (Signed)
[facility] Admission History & Physical  MRN : [id number]  Johnny [name]. is a 65 y.o. (1951-04-11) male who presents with chief complaint of No chief complaint on file. [name]  History of Present Illness: patient sent from dialysis center with clotted access.  No other complaints.  Was working fine until this week.  Denies fever or chills  Current Facility-Administered Medications  Medication Dose Route Frequency Provider Last Rate Last Dose  . 0.9 %  sodium chloride infusion   Intravenous Continuous Kimberly A Stegmayer, PA-C      . famotidine (PEPCID) tablet 40 mg  40 mg Oral PRN Janalyn Harder Stegmayer, PA-C      . HYDROmorphone (DILAUDID) injection 1 mg  1 mg Intravenous Once [employer], PA-C      . methylPREDNISolone sodium succinate (SOLU-MEDROL) 125 mg/2 mL injection 125 mg  125 mg Intravenous PRN Kimberly A Stegmayer, PA-C      . ondansetron (ZOFRAN) injection 4 mg  4 mg Intravenous Q6H PRN Sela Hua, PA-C        Past Medical History:  Diagnosis Date  . Anemia   . CHF (congestive heart failure) ([place])   . Chronic kidney disease   . Gout   . Hyperlipidemia   . Hypertension     Past Surgical History:  Procedure Laterality Date  . AV FISTULA PLACEMENT Left 09/18/2015   Procedure: INSERTION OF ARTERIOVENOUS (AV) GORE-TEX GRAFT ARM ( BRACH/AXILLARY GRAFT W/ INSTANT STICK GRAFT );  Surgeon: Katha Cabal, MD;  Location: [facility];  Service: Vascular;  Laterality: Left;  . DIALYSIS FISTULA CREATION    . PERIPHERAL VASCULAR CATHETERIZATION Left 09/01/2015   Procedure: A/V Shuntogram/Fistulagram;  Surgeon: Katha Cabal, MD;  Location: [place] [facility];  Service: Cardiovascular;  Laterality: Left;  . PERIPHERAL VASCULAR CATHETERIZATION N/A 09/30/2015   Procedure: A/V Shuntogram/Fistulagram with perm cathether removal;  Surgeon: Algernon Huxley, MD;  Location: [place] [facility];  Service: Cardiovascular;  Laterality: N/A;  .  PERIPHERAL VASCULAR CATHETERIZATION Left 09/30/2015   Procedure: A/V Shunt Intervention;  Surgeon: Algernon Huxley, MD;  Location: [facility];  Service: Cardiovascular;  Laterality: Left;    Social History Social History  Substance Use Topics  . Smoking status: Former Research scientist (life sciences)  . Smokeless tobacco: Never Used  . Alcohol use No    Family History Family History  Problem Relation Age of Onset  . Hypertension    . Heart disease    no bleeding disorders, clotting disorders, or autoimmune diseases  Allergies  Allergen Reactions  . Shellfish Allergy Anaphylaxis     REVIEW OF SYSTEMS (Negative unless checked)  Constitutional: [] Weight loss  [] Fever  [] Chills Cardiac: [] Chest pain   [] Chest pressure   [] Palpitations   [] Shortness of breath when laying flat   [] Shortness of breath at rest   [] Shortness of breath with exertion. Vascular:  [] Pain in legs with walking   [] Pain in legs at rest   [] Pain in legs when laying flat   [] Claudication   [] Pain in feet when walking  [] Pain in feet at rest  [] Pain in feet when laying flat   [] History of DVT   [] Phlebitis   [] Swelling in legs   [] Varicose veins   [] Non-healing ulcers Pulmonary:   [] Uses home oxygen   [] Productive cough   [] Hemoptysis   [] Wheeze  [] COPD   [] Asthma Neurologic:  [] Dizziness  [] Blackouts   [] Seizures   [] History of stroke   []   History of TIA  [] Aphasia   [] Temporary blindness   [] Dysphagia   [] Weakness or numbness in arms   [] Weakness or numbness in legs Musculoskeletal:  [] Arthritis   [] Joint swelling   [] Joint pain   [] Low back pain Hematologic:  [] Easy bruising  [] Easy bleeding   [] Hypercoagulable state   [] Anemic  [] Hepatitis Gastrointestinal:  [] Blood in stool   [] Vomiting blood  [] Gastroesophageal reflux/heartburn   [] Difficulty swallowing. Genitourinary:  [x] Chronic kidney disease   [] Difficult urination  [] Frequent urination  [] Burning with urination   [] Blood in urine Skin:  [] Rashes   [] Ulcers    [] Wounds Psychological:  [] History of anxiety   []  History of major depression.  Physical Examination  Vitals:   12/03/15 1423  BP: (!) 154/86  Pulse: 65  Resp: 17  Temp: 98.3 F (36.8 C)  TempSrc: Oral  SpO2: 97%  Weight: 81.6 kg (180 lb)  Height: 5\' 8"  (1.727 m)   Body mass index is 27.37 kg/m. Gen: WD/WN, NAD Head: [place]/AT, No temporalis wasting. Prominent temp pulse not noted. Ear/Nose/Throat: Hearing grossly intact, nares w/o erythema or drainage, oropharynx w/o Erythema/Exudate,  Eyes: PERRLA, EOMI.  Neck: Supple, no nuchal rigidity.  No JVD.  Pulmonary:  Good air movement, no use of accessory muscles.  Cardiac: RRR, normal S1, S2 Vascular:  Vessel Right Left  Radial Palpable Palpable                                   Gastrointestinal: soft, non-tender/non-distended. No guarding/reflex.  Musculoskeletal: M/S 5/5 throughout.  Extremities without ischemic changes.  No deformity or atrophy.  Neurologic: CN 2-12 intact. Pain and light touch intact in extremities.  Symmetrical.  Speech is fluent. Motor exam as listed above. Psychiatric: Judgment intact, Mood & affect appropriate for pt's clinical situation. Dermatologic: No rashes or ulcers noted.  No cellulitis or open wounds. Lymph : No Cervical, Axillary, or Inguinal lymphadenopathy.     CBC Lab Results  Component Value Date   WBC 11.4 (H) 10/02/2015   HGB 8.7 (L) 10/02/2015   HCT 27.8 (L) 10/02/2015   MCV 88.1 10/02/2015   PLT 164 10/02/2015    BMET    Component Value Date/Time   NA 139 10/02/2015 0512   NA 139 01/17/2014 0502   K 4.5 12/02/2015 0809   K 4.8 01/17/2014 0502   CL 102 10/02/2015 0512   CL 102 01/17/2014 0502   CO2 25 10/02/2015 0512   CO2 27 01/17/2014 0502   GLUCOSE 79 10/02/2015 0512   GLUCOSE 101 (H) 01/17/2014 0502   BUN 54 (H) 10/02/2015 0512   BUN 62 (H) 01/17/2014 0502   CREATININE 11.56 (H) 10/02/2015 0512   CREATININE 12.30 (H) 01/17/2014 0502   CALCIUM 8.0 (L)  10/02/2015 0512   CALCIUM 8.2 (L) 01/17/2014 0502   GFRNONAA 4 (L) 10/02/2015 0512   GFRNONAA 4 (L) 01/17/2014 0502   GFRAA 5 (L) 10/02/2015 0512   GFRAA 4 (L) 01/17/2014 0502   CrCl cannot be calculated (Patient's most recent lab result is older than the maximum 21 days allowed.).  COAG Lab Results  Component Value Date   INR 1.18 09/28/2015   INR 1.04 09/15/2015   INR 1.0 06/11/2011    Radiology No results found.    Assessment/Plan 1. Dysfunction of dialysis access.  Will try to salvage with declot today 2. ESRD. Long standing 3. HTN. Stable on outpatient medications.   Ramsey Guadamuz,  MD  12/03/2015 2:25 PM

## 2015-12-03 NOTE — Discharge Instructions (Signed)

## 2015-12-03 NOTE — OR Nursing (Signed)
ekg changes, axis change and increased BBB, (EKG saved) noted during angiojet, pt arousable and denies discomfort. Dr Lucky Cowboy aware.

## 2015-12-04 ENCOUNTER — Encounter: Payer: Self-pay | Admitting: Vascular Surgery

## 2016-01-27 ENCOUNTER — Other Ambulatory Visit: Payer: Self-pay | Admitting: Vascular Surgery

## 2016-01-27 MED ORDER — DEXTROSE 5 % IV SOLN
1.5000 g | INTRAVENOUS | Status: AC
  Administered 2016-01-28: 1.5 g via INTRAVENOUS

## 2016-01-28 ENCOUNTER — Encounter
Admission: RE | Disposition: A | Discharge status: Home / Self Care | Payer: Self-pay | Source: Ambulatory Visit | Attending: Vascular Surgery

## 2016-01-28 ENCOUNTER — Ambulatory Visit
Admission: RE | Admit: 2016-01-28 | Discharge: 2016-01-28 | Disposition: A | Discharge status: Home / Self Care | Payer: Medicare Other | Source: Ambulatory Visit | Attending: Vascular Surgery | Admitting: Vascular Surgery

## 2016-01-28 DIAGNOSIS — E785 Hyperlipidemia, unspecified: Secondary | ICD-10-CM | POA: Insufficient documentation

## 2016-01-28 DIAGNOSIS — D649 Anemia, unspecified: Secondary | ICD-10-CM | POA: Insufficient documentation

## 2016-01-28 DIAGNOSIS — Y832 Surgical operation with anastomosis, bypass or graft as the cause of abnormal reaction of the patient, or of later complication, without mention of misadventure at the time of the procedure: Secondary | ICD-10-CM | POA: Insufficient documentation

## 2016-01-28 DIAGNOSIS — T82868A Thrombosis of vascular prosthetic devices, implants and grafts, initial encounter: Secondary | ICD-10-CM | POA: Insufficient documentation

## 2016-01-28 DIAGNOSIS — I132 Hypertensive heart and chronic kidney disease with heart failure and with stage 5 chronic kidney disease, or end stage renal disease: Secondary | ICD-10-CM | POA: Insufficient documentation

## 2016-01-28 DIAGNOSIS — M109 Gout, unspecified: Secondary | ICD-10-CM | POA: Diagnosis not present

## 2016-01-28 DIAGNOSIS — Z7982 Long term (current) use of aspirin: Secondary | ICD-10-CM | POA: Diagnosis not present

## 2016-01-28 DIAGNOSIS — N186 End stage renal disease: Secondary | ICD-10-CM | POA: Diagnosis not present

## 2016-01-28 DIAGNOSIS — Z91013 Allergy to seafood: Secondary | ICD-10-CM | POA: Diagnosis not present

## 2016-01-28 DIAGNOSIS — Z87891 Personal history of nicotine dependence: Secondary | ICD-10-CM | POA: Insufficient documentation

## 2016-01-28 DIAGNOSIS — I509 Heart failure, unspecified: Secondary | ICD-10-CM | POA: Diagnosis not present

## 2016-01-28 DIAGNOSIS — Z8249 Family history of ischemic heart disease and other diseases of the circulatory system: Secondary | ICD-10-CM | POA: Insufficient documentation

## 2016-01-28 DIAGNOSIS — Z992 Dependence on renal dialysis: Secondary | ICD-10-CM | POA: Insufficient documentation

## 2016-01-28 HISTORY — PX: PERIPHERAL VASCULAR CATHETERIZATION: SHX172C

## 2016-01-28 LAB — POTASSIUM (ARMC VASCULAR LAB ONLY): POTASSIUM (ARMC VASCULAR LAB): 5.2 — AB (ref 3.5–5.1)

## 2016-01-28 SURGERY — THROMBECTOMY
Anesthesia: Moderate Sedation | Site: Arm Upper

## 2016-01-28 MED ORDER — FAMOTIDINE 20 MG PO TABS
ORAL_TABLET | ORAL | Status: AC
  Filled 2016-01-28: qty 2

## 2016-01-28 MED ORDER — METHYLPREDNISOLONE SODIUM SUCC 125 MG IJ SOLR
INTRAMUSCULAR | Status: AC
  Administered 2016-01-28: 125 mg via INTRAVENOUS
  Filled 2016-01-28: qty 2

## 2016-01-28 MED ORDER — HEPARIN SODIUM (PORCINE) 1000 UNIT/ML IJ SOLN
INTRAMUSCULAR | Status: AC
  Filled 2016-01-28: qty 1

## 2016-01-28 MED ORDER — SODIUM CHLORIDE 0.9 % IV SOLN
INTRAVENOUS | Status: DC
  Administered 2016-01-28: 10:00:00 via INTRAVENOUS

## 2016-01-28 MED ORDER — IOPAMIDOL (ISOVUE-300) INJECTION 61%
INTRAVENOUS | Status: DC | PRN
  Administered 2016-01-28: 30 mL via INTRA_ARTERIAL

## 2016-01-28 MED ORDER — LIDOCAINE-EPINEPHRINE (PF) 1 %-1:200000 IJ SOLN
INTRAMUSCULAR | Status: AC
  Filled 2016-01-28: qty 30

## 2016-01-28 MED ORDER — SODIUM CHLORIDE 0.9 % IV SOLN: 500.0000 mL | Freq: Once | INTRAVENOUS | Status: DC | PRN

## 2016-01-28 MED ORDER — LABETALOL HCL 5 MG/ML IV SOLN: 10.0000 mg | INTRAVENOUS | Status: DC | PRN

## 2016-01-28 MED ORDER — METOPROLOL TARTRATE 5 MG/5ML IV SOLN
2.0000 mg | INTRAVENOUS | Status: DC | PRN
Start: 2016-01-28 — End: 2016-01-28

## 2016-01-28 MED ORDER — HEPARIN SODIUM (PORCINE) 1000 UNIT/ML IJ SOLN
INTRAMUSCULAR | Status: DC | PRN
  Administered 2016-01-28: 3000 [IU] via INTRAVENOUS

## 2016-01-28 MED ORDER — ONDANSETRON HCL 4 MG/2ML IJ SOLN: 4.0000 mg | Freq: Four times a day (QID) | INTRAMUSCULAR | Status: DC | PRN

## 2016-01-28 MED ORDER — FENTANYL CITRATE (PF) 100 MCG/2ML IJ SOLN
INTRAMUSCULAR | Status: AC
  Filled 2016-01-28: qty 2

## 2016-01-28 MED ORDER — PHENOL 1.4 % MT LIQD: 1.0000 | OROMUCOSAL | Status: DC | PRN

## 2016-01-28 MED ORDER — MIDAZOLAM HCL 5 MG/5ML IJ SOLN
INTRAMUSCULAR | Status: AC
  Filled 2016-01-28: qty 5

## 2016-01-28 MED ORDER — HYDRALAZINE HCL 20 MG/ML IJ SOLN
INTRAMUSCULAR | Status: AC
  Filled 2016-01-28: qty 1

## 2016-01-28 MED ORDER — ALTEPLASE 2 MG IJ SOLR
INTRAMUSCULAR | Status: AC
  Filled 2016-01-28: qty 4

## 2016-01-28 MED ORDER — HEPARIN (PORCINE) IN NACL 2-0.9 UNIT/ML-% IJ SOLN
INTRAMUSCULAR | Status: AC
  Filled 2016-01-28: qty 1500

## 2016-01-28 MED ORDER — FAMOTIDINE 20 MG PO TABS: 40.0000 mg | ORAL_TABLET | ORAL | Status: DC | PRN

## 2016-01-28 MED ORDER — HYDRALAZINE HCL 20 MG/ML IJ SOLN: 5.0000 mg | INTRAMUSCULAR | Status: DC | PRN

## 2016-01-28 MED ORDER — FENTANYL CITRATE (PF) 100 MCG/2ML IJ SOLN
INTRAMUSCULAR | Status: DC | PRN
  Administered 2016-01-28: 50 ug via INTRAVENOUS
  Administered 2016-01-28: 25 ug via INTRAVENOUS
  Administered 2016-01-28: 50 ug via INTRAVENOUS

## 2016-01-28 MED ORDER — MIDAZOLAM HCL 2 MG/2ML IJ SOLN
INTRAMUSCULAR | Status: DC | PRN
  Administered 2016-01-28: 1 mg via INTRAVENOUS
  Administered 2016-01-28: 2 mg via INTRAVENOUS
  Administered 2016-01-28 (×2): 1 mg via INTRAVENOUS

## 2016-01-28 MED ORDER — HYDROMORPHONE HCL 1 MG/ML IJ SOLN
1.0000 mg | Freq: Once | INTRAMUSCULAR | Status: DC
Start: 2016-01-28 — End: 2016-01-28

## 2016-01-28 MED ORDER — HYDROMORPHONE HCL 1 MG/ML IJ SOLN: 0.5000 mg | INTRAMUSCULAR | Status: DC | PRN

## 2016-01-28 MED ORDER — METHYLPREDNISOLONE SODIUM SUCC 125 MG IJ SOLR
125.0000 mg | INTRAMUSCULAR | Status: DC | PRN
  Administered 2016-01-28: 125 mg via INTRAVENOUS

## 2016-01-28 MED ORDER — HYDRALAZINE HCL 20 MG/ML IJ SOLN
INTRAMUSCULAR | Status: DC | PRN
  Administered 2016-01-28: 20 mg via INTRAVENOUS

## 2016-01-28 MED ORDER — ACETAMINOPHEN 325 MG PO TABS: 325.0000 mg | ORAL_TABLET | ORAL | Status: DC | PRN

## 2016-01-28 MED ORDER — OXYCODONE-ACETAMINOPHEN 5-325 MG PO TABS: 1.0000 | ORAL_TABLET | ORAL | Status: DC | PRN

## 2016-01-28 MED ORDER — GUAIFENESIN-DM 100-10 MG/5ML PO SYRP: 15.0000 mL | ORAL_SOLUTION | ORAL | Status: DC | PRN

## 2016-01-28 MED ORDER — ACETAMINOPHEN 325 MG RE SUPP
325.0000 mg | RECTAL | Status: DC | PRN
Start: 2016-01-28 — End: 2016-01-28
  Filled 2016-01-28: qty 2

## 2016-01-28 SURGICAL SUPPLY — 16 items
BALLN LUTONIX DCB 7X60X130 (BALLOONS) ×3
BALLN ULTRVRSE 8X60X75C (BALLOONS) ×3
BALLOON LUTONIX DCB 7X60X130 (BALLOONS) ×2 IMPLANT
BALLOON ULTRVRSE 8X60X75C (BALLOONS) ×2 IMPLANT
CANNULA 5F STIFF (CANNULA) ×3 IMPLANT
CATH TORCON 5FR 0.38 (CATHETERS) ×3 IMPLANT
DEVICE PRESTO INFLATION (MISCELLANEOUS) ×3 IMPLANT
PACK ANGIOGRAPHY (CUSTOM PROCEDURE TRAY) ×3 IMPLANT
SET AVX THROMB ULT (MISCELLANEOUS) ×3 IMPLANT
SHEATH BRITE TIP 6FRX5.5 (SHEATH) ×3 IMPLANT
SHEATH BRITE TIP 7FRX5.5 (SHEATH) ×3 IMPLANT
STENT VIABAHN 8X50X120 (Permanent Stent) ×6 IMPLANT
STENT VIABAHN5X120X8X (Permanent Stent) ×4 IMPLANT
TOWEL OR 17X26 4PK STRL BLUE (TOWEL DISPOSABLE) ×3 IMPLANT
WIRE G 018X200 V18 (WIRE) ×3 IMPLANT
WIRE MAGIC TORQUE 260C (WIRE) ×3 IMPLANT

## 2016-01-28 NOTE — H&P (Signed)
[facility] History & Physical Update  The patient was interviewed and re-examined.  The patient's previous History and Physical has been reviewed and is unchanged.  There is no change in the plan of care. We plan to proceed with the scheduled procedure.  Payne Garske, MD  01/28/2016, 9:11 AM

## 2016-01-28 NOTE — Op Note (Signed)
[facility]    OPERATIVE NOTE   PROCEDURE: 1.  Left upper arm arteriovenous graft cannulation under ultrasound guidance in an antegrade fashion crossing 2.  Left arm shuntogram and central venogram 3.  Catheter directed thrombolysis with 4 mg of TPA delivered with the AngioJet AVX catheter 4.  Mechanical rheolytic thrombectomy to the left upper arm AV graft and axillary vein with the AngioJet AVX catheter 5.  Percutaneous transluminal angioplasty of the mid graft and venous anastomosis with 7 mm diameter angioplasty balloon including a Lutonix angioplasty balloon at the venous anastomosis 6.  Viabahn covered stent placement at the venous anastomosis and at the mid graft for residual stenosis and thrombus after above procedures using 28 mm diameter by 5 cm length stents  PRE-OPERATIVE DIAGNOSIS: 1. ESRD 2.  Thrombosed left upper arm arteriovenous graft  POST-OPERATIVE DIAGNOSIS: same as above   SURGEON: Leotis Pain, MD  ANESTHESIA: local with Moderate Conscious Sedation for approximately 35 minutes using 5 mg of Versed and 125 mcg of Fentanyl  ESTIMATED BLOOD LOSS: 10 cc  FINDING(S): 1. Thrombus causing high-grade stenosis at the venous anastomosis as well as thrombus in the mid graft stent with very little flow around this.  SPECIMEN(S):  None  CONTRAST: 30 cc  FLUORO TIME:  4.4 minutes  INDICATIONS: Patient is a 65 y.o.male who presents with a thrombosed left upper arm arteriovenous graft.  The patient is scheduled for an attempted declot and shuntogram.  The patient is aware the risks include but are not limited to: bleeding, infection, thrombosis of the cannulated access, and possible anaphylactic reaction to the contrast.  The patient is aware of the risks of the procedure and elects to proceed forward.  DESCRIPTION: After full informed written consent was obtained, the patient was brought back to the angiography suite and placed supine upon the  angiography table.  The patient was connected to monitoring equipment. Moderate conscious sedation was administered with a face to face encounter with the patient throughout the procedure with my supervision of the RN administering medicines and monitoring the patient's vital signs, pulse oximetry, telemetry and mental status throughout from the start of the procedure until the patient was taken to the recovery room. The left arm was prepped and draped in the standard fashion for a percutaneous access intervention.  Under ultrasound guidance, the left arteriovenous graft was cannulated with a micropuncture needle under direct ultrasound guidance in an antegrade fashion near the arterial anastomosis and permanent images were performed.  The microwire was advanced and the needle was exchanged for the a microsheath.  I then upsized to a 6 Fr Sheath and imaging was performed.  Hand injections were completed to image the access including the central venous system. This demonstrated thrombus causing high-grade stenosis at the venous anastomosis as well as thrombus in the mid graft stent with very little flow around this.  Based on the images, this patient will need extensive treatment to salvage the graft. I then gave the patient 3000 units of intravenous heparin.  I then placed a Magic torque wire into the axillary/subclavian vein from the antegrade sheath. 4 mg of TPA were deployed throughout the entirety of the graft and into the axillary vein. This was allowed to dwell for 5 minutes. Mechanical rheolytic thrombectomy was then performed throughout the graft and into the axillary vein. This uncovered residual thrombus and stenosis both at the venous anastomosis as well as in the stent at its leading edge in the mid graft.  Flow limitation was still seen worse in the mid graft but at both locations. I treated both areas with angioplasty. A 7 mm diameter by 6 cm length Lutonix drug-coated angioplasty balloon was  selected. This was first inflated at the venous anastomosis to 12 atm for 1 minute. Was then pulled back and used to treat the mid graft thrombus and stenosis. It was inflated to 10-12 atm for 1 minute as well. This resulted in minimal improvement in both locations with residual thrombus and stenosis seen.  I then elected to treat this with a covered stent in each location. I exchanged for a 7 French sheath and a 0.018 wire. An 8 mm diameter by 5 cm length stent was deployed first in the venous anastomosis going just back into the graft to encompass the thrombus and stenosis. Another 40m diameter by 5 cm length Viabahn stent was then deployed in the mid graft going back into the graft and covering the thrombus and stenosis at the leading edge of the previous stent. These were then postdilated with an 8 mm balloon with excellent angiographic completion result and less than 10% residual stenosis and no intraluminal thrombus identified.   Based on the completion imaging, no further intervention is necessary.  The wire and balloon were removed from the sheath.  A 4-0 Monocryl purse-string suture was sewn around the sheath.  The sheath was removed while tying down the suture.  A sterile bandage was applied to the puncture site.  COMPLICATIONS: None  CONDITION: Stable   Johnny [name] 01/28/2016 10:48 AM

## 2016-01-29 ENCOUNTER — Encounter: Payer: Self-pay | Admitting: Vascular Surgery

## 2016-02-03 NOTE — H&P (Signed)
[facility] Admission History & Physical  MRN : [id number]  Johnny [name]. is a 65 y.o. (17-May-1950) male who presents with chief complaint of No chief complaint on file. [name]  History of Present Illness: Patient with ESRD send from his dialysis access center with a clotted access.  No other complaints  No current facility-administered medications for this encounter.    Current Outpatient Prescriptions  Medication Sig Dispense Refill  . allopurinol (ZYLOPRIM) 100 MG tablet Take 100 mg by mouth daily.    [name] aspirin EC 81 MG tablet Take 1 tablet (81 mg total) by mouth daily. 150 tablet 2  . calcium acetate (PHOSLO) 667 MG capsule Take 1,334 mg by mouth 3 (three) times daily with meals.    . cinacalcet (SENSIPAR) 90 MG tablet Take 90 mg by mouth 2 (two) times daily.     . clonazePAM (KLONOPIN) 0.5 MG tablet Take 0.5 mg by mouth at bedtime.    . clopidogrel (PLAVIX) 75 MG tablet Take 1 tablet (75 mg total) by mouth daily. 30 tablet 11  . colchicine 0.6 MG tablet Take 0.6 mg by mouth daily as needed (for gout flares).     . folic acid-vitamin b complex-vitamin c-selenium-zinc (DIALYVITE) 3 MG TABS tablet Take 1 tablet by mouth every Monday, Wednesday, and Friday.    . furosemide (LASIX) 40 MG tablet Take 40 mg by mouth every Tuesday, Thursday, Saturday, and Sunday.     . hydrALAZINE (APRESOLINE) 25 MG tablet Take 25 mg by mouth 2 (two) times daily.     . metoprolol (LOPRESSOR) 50 MG tablet Take 50 mg by mouth 2 (two) times daily.    [name] oxyCODONE-acetaminophen (ROXICET) 5-325 MG tablet Take 1 tablet by mouth every 6 (six) hours as needed for moderate pain or severe pain. 20 tablet 0  . ramipril (ALTACE) 10 MG capsule Take 10 mg by mouth at bedtime.     . sevelamer carbonate (RENVELA) 800 MG tablet Take 2,400 mg by mouth 3 (three) times daily with meals.       Past Medical History:  Diagnosis Date  . Anemia   . CHF (congestive heart failure) ([place])   . Chronic kidney  disease   . Gout   . Hyperlipidemia   . Hypertension     Past Surgical History:  Procedure Laterality Date  . AV FISTULA PLACEMENT Left 09/18/2015   Procedure: INSERTION OF ARTERIOVENOUS (AV) GORE-TEX GRAFT ARM ( BRACH/AXILLARY GRAFT W/ INSTANT STICK GRAFT );  Surgeon: Katha Cabal, MD;  Location: [facility];  Service: Vascular;  Laterality: Left;  . DIALYSIS FISTULA CREATION    . PERIPHERAL VASCULAR CATHETERIZATION Left 09/01/2015   Procedure: A/V Shuntogram/Fistulagram;  Surgeon: Katha Cabal, MD;  Location: [place] [facility];  Service: Cardiovascular;  Laterality: Left;  . PERIPHERAL VASCULAR CATHETERIZATION N/A 09/30/2015   Procedure: A/V Shuntogram/Fistulagram with perm cathether removal;  Surgeon: Algernon Huxley, MD;  Location: [place] [facility];  Service: Cardiovascular;  Laterality: N/A;  . PERIPHERAL VASCULAR CATHETERIZATION Left 09/30/2015   Procedure: A/V Shunt Intervention;  Surgeon: Algernon Huxley, MD;  Location: [place] [facility];  Service: Cardiovascular;  Laterality: Left;  . PERIPHERAL VASCULAR CATHETERIZATION Left 12/03/2015   Procedure: Thrombectomy;  Surgeon: Algernon Huxley, MD;  Location: [place] [facility];  Service: Cardiovascular;  Laterality: Left;  . PERIPHERAL VASCULAR CATHETERIZATION Left 01/28/2016   Procedure: Thrombectomy;  Surgeon: Algernon Huxley, MD;  Location: [place] [facility];  Service:  Cardiovascular;  Laterality: Left;  . PERIPHERAL VASCULAR CATHETERIZATION N/A 01/28/2016   Procedure: A/V Shuntogram/Fistulagram;  Surgeon: Algernon Huxley, MD;  Location: [place] [facility];  Service: Cardiovascular;  Laterality: N/A;    Social History Social History  Substance Use Topics  . Smoking status: Former Research scientist (life sciences)  . Smokeless tobacco: Never Used  . Alcohol use No    Family History Family History  Problem Relation Age of Onset  . Hypertension    . Heart disease    no bleeding disorders, clotting disorders, or autoimmune diseases  Allergies  Allergen  Reactions  . Shellfish Allergy Anaphylaxis     REVIEW OF SYSTEMS (Negative unless checked)  Constitutional: [] Weight loss  [] Fever  [] Chills Cardiac: [] Chest pain   [] Chest pressure   [] Palpitations   [] Shortness of breath when laying flat   [] Shortness of breath at rest   [] Shortness of breath with exertion. Vascular:  [] Pain in legs with walking   [] Pain in legs at rest   [] Pain in legs when laying flat   [] Claudication   [] Pain in feet when walking  [] Pain in feet at rest  [] Pain in feet when laying flat   [] History of DVT   [] Phlebitis   [] Swelling in legs   [] Varicose veins   [] Non-healing ulcers Pulmonary:   [] Uses home oxygen   [] Productive cough   [] Hemoptysis   [] Wheeze  [] COPD   [] Asthma Neurologic:  [] Dizziness  [] Blackouts   [] Seizures   [] History of stroke   [] History of TIA  [] Aphasia   [] Temporary blindness   [] Dysphagia   [] Weakness or numbness in arms   [] Weakness or numbness in legs Musculoskeletal:  [] Arthritis   [] Joint swelling   [] Joint pain   [] Low back pain Hematologic:  [] Easy bruising  [] Easy bleeding   [] Hypercoagulable state   [] Anemic  [] Hepatitis Gastrointestinal:  [] Blood in stool   [] Vomiting blood  [] Gastroesophageal reflux/heartburn   [] Difficulty swallowing. Genitourinary:  [x] Chronic kidney disease   [] Difficult urination  [] Frequent urination  [] Burning with urination   [] Blood in urine Skin:  [] Rashes   [] Ulcers   [] Wounds Psychological:  [] History of anxiety   []  History of major depression.  Physical Examination  Vitals:   01/28/16 1050 01/28/16 1100 01/28/16 1115 01/28/16 1130  BP: 134/72 129/74 (!) 153/78 (!) 167/88  Pulse: 79 72 80 78  Resp: (!) 26 (!) 23 (!) 26 20  Temp:      TempSrc:      SpO2: 99% 99% 98% 100%  Weight:      Height:       Body mass index is 27.37 kg/m. Gen: WD/WN, NAD Head: [place]/AT, No temporalis wasting. Prominent temp pulse not noted. Ear/Nose/Throat: Hearing grossly intact, nares w/o erythema or drainage, oropharynx  w/o Erythema/Exudate,  Eyes: PERRLA, EOMI.  Neck: Supple, no nuchal rigidity.  No bruit or JVD.  Pulmonary:  Good air movement, clear to auscultation bilaterally, no use of accessory muscles.  Cardiac: RRR, normal S1, S2, no Murmurs, rubs or gallops. Vascular: no bruit or thrill present, is somewhat pulsatile near the arterial anastomosis Vessel Right Left  Radial Palpable Palpable                                   Gastrointestinal: soft, non-tender/non-distended. No guarding/reflex.  Musculoskeletal: M/S 5/5 throughout.  Extremities without ischemic changes.  No deformity or atrophy.  Neurologic: CN 2-12 intact. Pain and light touch  intact in extremities.  Symmetrical.  Speech is fluent. Motor exam as listed above. Psychiatric: Judgment intact, Mood & affect appropriate for pt's clinical situation. Dermatologic: No rashes or ulcers noted.  No cellulitis or open wounds. Lymph : No Cervical, Axillary, or Inguinal lymphadenopathy.     CBC Lab Results  Component Value Date   WBC 11.4 (H) 10/02/2015   HGB 8.7 (L) 10/02/2015   HCT 27.8 (L) 10/02/2015   MCV 88.1 10/02/2015   PLT 164 10/02/2015    BMET    Component Value Date/Time   NA 139 10/02/2015 0512   NA 139 01/17/2014 0502   K 4.5 12/02/2015 0809   K 4.8 01/17/2014 0502   CL 102 10/02/2015 0512   CL 102 01/17/2014 0502   CO2 25 10/02/2015 0512   CO2 27 01/17/2014 0502   GLUCOSE 79 10/02/2015 0512   GLUCOSE 101 (H) 01/17/2014 0502   BUN 54 (H) 10/02/2015 0512   BUN 62 (H) 01/17/2014 0502   CREATININE 11.56 (H) 10/02/2015 0512   CREATININE 12.30 (H) 01/17/2014 0502   CALCIUM 8.0 (L) 10/02/2015 0512   CALCIUM 8.2 (L) 01/17/2014 0502   GFRNONAA 4 (L) 10/02/2015 0512   GFRNONAA 4 (L) 01/17/2014 0502   GFRAA 5 (L) 10/02/2015 0512   GFRAA 4 (L) 01/17/2014 0502   CrCl cannot be calculated (Patient's most recent lab result is older than the maximum 21 days allowed.).  COAG Lab Results  Component Value Date    INR 1.18 09/28/2015   INR 1.04 09/15/2015   INR 1.0 06/11/2011    Radiology No results found.    Assessment/Plan 1. Clotted access.  Will plan angiogram and attempt at salvage 2. ESRD. Needs access as above 3. HTN. Stable on outpatient medications   DEW,JASON, MD  02/03/2016 4:01 PM

## 2016-02-11 ENCOUNTER — Encounter (INDEPENDENT_AMBULATORY_CARE_PROVIDER_SITE_OTHER): Payer: Self-pay

## 2016-02-11 ENCOUNTER — Ambulatory Visit (INDEPENDENT_AMBULATORY_CARE_PROVIDER_SITE_OTHER): Payer: Self-pay | Admitting: Vascular Surgery

## 2016-03-07 ENCOUNTER — Other Ambulatory Visit (INDEPENDENT_AMBULATORY_CARE_PROVIDER_SITE_OTHER): Payer: Self-pay | Admitting: Vascular Surgery

## 2016-03-07 DIAGNOSIS — Z992 Dependence on renal dialysis: Secondary | ICD-10-CM

## 2016-03-07 DIAGNOSIS — N186 End stage renal disease: Secondary | ICD-10-CM

## 2016-03-07 DIAGNOSIS — T829XXA Unspecified complication of cardiac and vascular prosthetic device, implant and graft, initial encounter: Secondary | ICD-10-CM

## 2016-03-08 ENCOUNTER — Ambulatory Visit (INDEPENDENT_AMBULATORY_CARE_PROVIDER_SITE_OTHER): Payer: Medicare Other

## 2016-03-08 ENCOUNTER — Ambulatory Visit (INDEPENDENT_AMBULATORY_CARE_PROVIDER_SITE_OTHER): Payer: Medicare Other | Admitting: Vascular Surgery

## 2016-03-08 ENCOUNTER — Encounter (INDEPENDENT_AMBULATORY_CARE_PROVIDER_SITE_OTHER): Payer: Self-pay | Admitting: Vascular Surgery

## 2016-03-08 VITALS — BP 153/74 | HR 74 | Resp 16 | Ht 69.0 in | Wt 186.0 lb

## 2016-03-08 DIAGNOSIS — Z992 Dependence on renal dialysis: Secondary | ICD-10-CM | POA: Diagnosis not present

## 2016-03-08 DIAGNOSIS — T829XXA Unspecified complication of cardiac and vascular prosthetic device, implant and graft, initial encounter: Secondary | ICD-10-CM

## 2016-03-08 DIAGNOSIS — T8249XD Other complication of vascular dialysis catheter, subsequent encounter: Secondary | ICD-10-CM

## 2016-03-08 DIAGNOSIS — N186 End stage renal disease: Secondary | ICD-10-CM | POA: Diagnosis not present

## 2016-03-08 DIAGNOSIS — I10 Essential (primary) hypertension: Secondary | ICD-10-CM

## 2016-03-08 NOTE — Progress Notes (Signed)
Subjective:    Patient ID: Johnny [name]., male    DOB: May 02, 1951, 65 y.o.   MRN: [id number] Chief Complaint  Patient presents with  . Re-evaluation    Ultrasound follow up    Patient presents for his first post-procedure follow up. He is s/p thrombectomy with stent placement to his graft on 01/28/16. His post-procedure course has been unremarkable. He is without complaint. States improvement in the function of his graft. The patient denies any issues with hemodialysis such as cannulation problems, increased bleeding, decrease in doppler flow or recirculation. The patient also denies any graft skin breakdown, pain, edema, pallor or ulceration of the arm / hand. The patient underwent a duplex ultrasound of the AV access which was notable for a patent graft / stent without any significant hemodynamic stenosis.    Review of Systems  Constitutional: Negative.   HENT: Negative.   Eyes: Negative.   Respiratory: Negative.   Cardiovascular: Negative.   Gastrointestinal: Negative.   Genitourinary:       ESRD  Musculoskeletal: Negative.   Skin: Negative.   Allergic/Immunologic: Negative.   Neurological: Negative.   Hematological: Negative.   Psychiatric/Behavioral: Negative.        Objective:   Physical Exam  Constitutional: He is oriented to person, place, and time. He appears well-developed and well-nourished.  HENT:  Head: Normocephalic and atraumatic.  Eyes: Conjunctivae and EOM are normal. Pupils are equal, round, and reactive to light.  Neck: Normal range of motion.  Cardiovascular: Normal rate, regular rhythm, normal heart sounds and intact distal pulses.   Pulses:      Radial pulses are 2+ on the right side, and 2+ on the left side.       Dorsalis pedis pulses are 1+ on the right side, and 1+ on the left side.       Posterior tibial pulses are 1+ on the right side, and 1+ on the left side.  Left Upper Extremity Graft: Good bruit and thrill  Pulmonary/Chest: Effort normal  and breath sounds normal.  Abdominal: Soft. Bowel sounds are normal.  Musculoskeletal: Normal range of motion. He exhibits no edema.  Neurological: He is alert and oriented to person, place, and time.  Skin: Skin is warm and dry.  Psychiatric: He has a normal mood and affect. His behavior is normal. Judgment and thought content normal.   BP (!) 153/74 (BP Location: Right Arm)   Pulse 74   Resp 16   Ht 5\' 9"  (1.753 m)   Wt 186 lb (84.4 kg)   BMI 27.47 kg/m   Past Medical History:  Diagnosis Date  . Anemia   . CHF (congestive heart failure) ([place])   . Chronic kidney disease   . Gout   . Hyperlipidemia   . Hypertension    Social History   Social History  . Marital status: Single    Spouse name: N/A  . Number of children: N/A  . Years of education: N/A   Occupational History  . Not on file.   Social History Main Topics  . Smoking status: Former Research scientist (life sciences)  . Smokeless tobacco: Never Used  . Alcohol use No  . Drug use: No  . Sexual activity: Not on file   Other Topics Concern  . Not on file   Social History Narrative  . No narrative on file   Past Surgical History:  Procedure Laterality Date  . AV FISTULA PLACEMENT Left 09/18/2015   Procedure: INSERTION OF ARTERIOVENOUS (AV) GORE-TEX GRAFT  ARM ( BRACH/AXILLARY GRAFT W/ INSTANT STICK GRAFT );  Surgeon: Katha Cabal, MD;  Location: [facility];  Service: Vascular;  Laterality: Left;  . DIALYSIS FISTULA CREATION    . PERIPHERAL VASCULAR CATHETERIZATION Left 09/01/2015   Procedure: A/V Shuntogram/Fistulagram;  Surgeon: Katha Cabal, MD;  Location: [place] [facility];  Service: Cardiovascular;  Laterality: Left;  . PERIPHERAL VASCULAR CATHETERIZATION N/A 09/30/2015   Procedure: A/V Shuntogram/Fistulagram with perm cathether removal;  Surgeon: Algernon Huxley, MD;  Location: [place] [facility];  Service: Cardiovascular;  Laterality: N/A;  . PERIPHERAL VASCULAR CATHETERIZATION Left 09/30/2015   Procedure: A/V Shunt  Intervention;  Surgeon: Algernon Huxley, MD;  Location: [place] [facility];  Service: Cardiovascular;  Laterality: Left;  . PERIPHERAL VASCULAR CATHETERIZATION Left 12/03/2015   Procedure: Thrombectomy;  Surgeon: Algernon Huxley, MD;  Location: [place] [facility];  Service: Cardiovascular;  Laterality: Left;  . PERIPHERAL VASCULAR CATHETERIZATION Left 01/28/2016   Procedure: Thrombectomy;  Surgeon: Algernon Huxley, MD;  Location: [place] [facility];  Service: Cardiovascular;  Laterality: Left;  . PERIPHERAL VASCULAR CATHETERIZATION N/A 01/28/2016   Procedure: A/V Shuntogram/Fistulagram;  Surgeon: Algernon Huxley, MD;  Location: [place] [facility];  Service: Cardiovascular;  Laterality: N/A;   Family History  Problem Relation Age of Onset  . Hypertension    . Heart disease     Allergies  Allergen Reactions  . Shellfish Allergy Anaphylaxis      Assessment & Plan:  Patient presents for his first post-procedure follow up. He is s/p thrombectomy with stent placement to his graft on 01/28/16. His post-procedure course has been unremarkable. He is without complaint. States improvement in the function of his graft. The patient denies any issues with hemodialysis such as cannulation problems, increased bleeding, decrease in doppler flow or recirculation. The patient also denies any graft skin breakdown, pain, edema, pallor or ulceration of the arm / hand. The patient underwent a duplex ultrasound of the AV access which was notable for a patent graft / stent without any significant hemodynamic stenosis.   1. Complications, mechanical, catheter, dialysis, subsequent encounter ([place]) - Improved Improved s/p intervention.   2. ESRD on dialysis ([place]) - Stable The patient is doing well and currently has adequate dialysis access. Duplex ultrasound of the AV access shows a patent access with no evidence of hemodynamically significant strictures or stenosis.  The patient should continue to have duplex ultrasounds of the  dialysis access every six months. The patient was instructed to call the office in the interim if any issues with dialysis access / doppler flow, pain, edema, pallor, fistula skin breakdown or ulceration of the arm / hand occur. The patient expressed their understanding.  3. Hypertension, unspecified type - Stable Encouraged good control as its slows the progression of atherosclerotic disease  Current Outpatient Prescriptions on File Prior to Visit  Medication Sig Dispense Refill  . allopurinol (ZYLOPRIM) 100 MG tablet Take 100 mg by mouth daily.    [name] aspirin EC 81 MG tablet Take 1 tablet (81 mg total) by mouth daily. 150 tablet 2  . calcium acetate (PHOSLO) 667 MG capsule Take 1,334 mg by mouth 3 (three) times daily with meals.    . cinacalcet (SENSIPAR) 90 MG tablet Take 90 mg by mouth 2 (two) times daily.     . clonazePAM (KLONOPIN) 0.5 MG tablet Take 0.5 mg by mouth at bedtime.    . clopidogrel (PLAVIX) 75 MG tablet Take 1 tablet (75 mg  total) by mouth daily. 30 tablet 11  . colchicine 0.6 MG tablet Take 0.6 mg by mouth daily as needed (for gout flares).     . folic acid-vitamin b complex-vitamin c-selenium-zinc (DIALYVITE) 3 MG TABS tablet Take 1 tablet by mouth every Monday, Wednesday, and Friday.    . furosemide (LASIX) 40 MG tablet Take 40 mg by mouth every Tuesday, Thursday, Saturday, and Sunday.     . hydrALAZINE (APRESOLINE) 25 MG tablet Take 25 mg by mouth 2 (two) times daily.     . metoprolol (LOPRESSOR) 50 MG tablet Take 50 mg by mouth 2 (two) times daily.    . ramipril (ALTACE) 10 MG capsule Take 10 mg by mouth at bedtime.     . sevelamer carbonate (RENVELA) 800 MG tablet Take 2,400 mg by mouth 3 (three) times daily with meals.      No current facility-administered medications on file prior to visit.     There are no Patient Instructions on file for this visit. Return for Six Month Follow Up.   Philomena Buttermore A Arali Somera, PA-C

## 2016-07-12 ENCOUNTER — Ambulatory Visit (INDEPENDENT_AMBULATORY_CARE_PROVIDER_SITE_OTHER): Payer: Self-pay | Admitting: Vascular Surgery

## 2016-07-12 ENCOUNTER — Encounter (INDEPENDENT_AMBULATORY_CARE_PROVIDER_SITE_OTHER): Payer: Self-pay

## 2016-09-01 ENCOUNTER — Encounter (INDEPENDENT_AMBULATORY_CARE_PROVIDER_SITE_OTHER): Payer: Self-pay | Admitting: Vascular Surgery

## 2016-09-01 ENCOUNTER — Ambulatory Visit (INDEPENDENT_AMBULATORY_CARE_PROVIDER_SITE_OTHER): Payer: Medicare Other | Admitting: Vascular Surgery

## 2016-09-01 ENCOUNTER — Ambulatory Visit (INDEPENDENT_AMBULATORY_CARE_PROVIDER_SITE_OTHER): Payer: Medicare Other

## 2016-09-01 VITALS — BP 149/77 | HR 59 | Resp 16 | Ht 68.0 in | Wt 181.0 lb

## 2016-09-01 DIAGNOSIS — N186 End stage renal disease: Secondary | ICD-10-CM

## 2016-09-01 DIAGNOSIS — T8249XD Other complication of vascular dialysis catheter, subsequent encounter: Secondary | ICD-10-CM | POA: Diagnosis not present

## 2016-09-01 DIAGNOSIS — I10 Essential (primary) hypertension: Secondary | ICD-10-CM

## 2016-09-01 DIAGNOSIS — Z992 Dependence on renal dialysis: Secondary | ICD-10-CM | POA: Diagnosis not present

## 2016-10-31 NOTE — Progress Notes (Signed)
Subjective:    Patient ID: Johnny Bucco., male    DOB: 25-Sep-1950, 66 y.o.   MRN: [id number] Chief Complaint  Patient presents with  . Re-evaluation    6 month ultrasound follow up     Patient presents for a 6 month hemodialysis access follow up. The patient underwent a duplex ultrasound of the AV access which was notable for a patent graft without any significant hemodynamic stenosis. Patient reports his hemodialysis doppler flow is unchanged. The patient denies any issues with hemodialysis such as cannulation problems, increased bleeding, decrease in doppler flow or recirculation. The patient also denies any fistula skin breakdown, pain, edema, pallor or ulceration of the arm / hand.    Review of Systems  Constitutional: Negative.   HENT: Negative.   Eyes: Negative.   Respiratory: Negative.   Cardiovascular: Negative.   Gastrointestinal: Negative.   Endocrine: Negative.   Genitourinary: Negative.   Musculoskeletal: Negative.   Skin: Negative.   Allergic/Immunologic: Negative.   Neurological: Negative.   Hematological: Negative.   Psychiatric/Behavioral: Negative.        Objective:   Physical Exam  Constitutional: He is oriented to person, place, and time. He appears well-developed and well-nourished. No distress.  HENT:  Head: Normocephalic and atraumatic.  Eyes: Conjunctivae are normal. Pupils are equal, round, and reactive to light.  Neck: Normal range of motion.  Cardiovascular: Normal rate, regular rhythm, normal heart sounds and intact distal pulses.   Pulses:      Radial pulses are 2+ on the right side, and 2+ on the left side.  Left upper extremity access good bruit and thrill. Skin is intact.  Pulmonary/Chest: Effort normal.  Musculoskeletal: Normal range of motion. He exhibits no edema.  Neurological: He is alert and oriented to person, place, and time.  Skin: Skin is warm and dry. He is not diaphoretic.  Psychiatric: He has a normal mood and affect. His  behavior is normal. Judgment and thought content normal.  Vitals reviewed.  BP (!) 149/77 (BP Location: Right Arm)   Pulse (!) 59   Resp 16   Ht 5\' 8"  (1.727 m)   Wt 181 lb (82.1 kg)   BMI 27.52 kg/m   Past Medical History:  Diagnosis Date  . Anemia   . CHF (congestive heart failure) ([place])   . Chronic kidney disease   . Gout   . Hyperlipidemia   . Hypertension     Social History   Social History  . Marital status: Single    Spouse name: N/A  . Number of children: N/A  . Years of education: N/A   Occupational History  . Not on file.   Social History Main Topics  . Smoking status: Former Research scientist (life sciences)  . Smokeless tobacco: Never Used  . Alcohol use No  . Drug use: No  . Sexual activity: Not on file   Other Topics Concern  . Not on file   Social History Narrative  . No narrative on file    Past Surgical History:  Procedure Laterality Date  . AV FISTULA PLACEMENT Left 09/18/2015   Procedure: INSERTION OF ARTERIOVENOUS (AV) GORE-TEX GRAFT ARM ( BRACH/AXILLARY GRAFT W/ INSTANT STICK GRAFT );  Surgeon: Katha Cabal, MD;  Location: [facility];  Service: Vascular;  Laterality: Left;  . DIALYSIS FISTULA CREATION    . PERIPHERAL VASCULAR CATHETERIZATION Left 09/01/2015   Procedure: A/V Shuntogram/Fistulagram;  Surgeon: Katha Cabal, MD;  Location: [place] [facility];  Service: Cardiovascular;  Laterality:  Left;  . PERIPHERAL VASCULAR CATHETERIZATION N/A 09/30/2015   Procedure: A/V Shuntogram/Fistulagram with perm cathether removal;  Surgeon: Algernon Huxley, MD;  Location: [place] [facility];  Service: Cardiovascular;  Laterality: N/A;  . PERIPHERAL VASCULAR CATHETERIZATION Left 09/30/2015   Procedure: A/V Shunt Intervention;  Surgeon: Algernon Huxley, MD;  Location: [place] [facility];  Service: Cardiovascular;  Laterality: Left;  . PERIPHERAL VASCULAR CATHETERIZATION Left 12/03/2015   Procedure: Thrombectomy;  Surgeon: Algernon Huxley, MD;  Location: [place] [facility];   Service: Cardiovascular;  Laterality: Left;  . PERIPHERAL VASCULAR CATHETERIZATION Left 01/28/2016   Procedure: Thrombectomy;  Surgeon: Algernon Huxley, MD;  Location: [place] [facility];  Service: Cardiovascular;  Laterality: Left;  . PERIPHERAL VASCULAR CATHETERIZATION N/A 01/28/2016   Procedure: A/V Shuntogram/Fistulagram;  Surgeon: Algernon Huxley, MD;  Location: [place] [facility];  Service: Cardiovascular;  Laterality: N/A;    Family History  Problem Relation Age of Onset  . Hypertension Unknown   . Heart disease Unknown     Allergies  Allergen Reactions  . Shellfish Allergy Anaphylaxis       Assessment & Plan:  Patient presents for a 6 month hemodialysis access follow up. The patient underwent a duplex ultrasound of the AV access which was notable for a patent graft without any significant hemodynamic stenosis. Patient reports his hemodialysis doppler flow is unchanged. The patient denies any issues with hemodialysis such as cannulation problems, increased bleeding, decrease in doppler flow or recirculation. The patient also denies any fistula skin breakdown, pain, edema, pallor or ulceration of the arm / hand.   1. ESRD on dialysis ([place]) - Stable Studies reviewed with patient. The patient is doing well and currently has adequate dialysis access. Duplex ultrasound of the AV access shows a patent access with no evidence of hemodynamically significant strictures or stenosis.  The patient should continue to have duplex ultrasounds of the dialysis access every 6 months. The patient was instructed to call the office in the interim if any issues with dialysis access / doppler flow, pain, edema, pallor, fistula skin breakdown or ulceration of the arm / hand occur. The patient expressed their understanding.  - VAS US DUPLEX DIALYSIS ACCESS (AVF,AVG); Future  2. Hypertension, unspecified type - stable Encouraged good control as its slows the progression of atherosclerotic  disease   Current Outpatient Prescriptions on File Prior to Visit  Medication Sig Dispense Refill  . allopurinol (ZYLOPRIM) 100 MG tablet Take 100 mg by mouth daily.    [name] aspirin EC 81 MG tablet Take 1 tablet (81 mg total) by mouth daily. 150 tablet 2  . calcium acetate (PHOSLO) 667 MG capsule Take 1,334 mg by mouth 3 (three) times daily with meals.    . cinacalcet (SENSIPAR) 90 MG tablet Take 90 mg by mouth 2 (two) times daily.     . clonazePAM (KLONOPIN) 0.5 MG tablet Take 0.5 mg by mouth at bedtime.    . clopidogrel (PLAVIX) 75 MG tablet Take 1 tablet (75 mg total) by mouth daily. 30 tablet 11  . colchicine 0.6 MG tablet Take 0.6 mg by mouth daily as needed (for gout flares).     . folic acid-vitamin b complex-vitamin c-selenium-zinc (DIALYVITE) 3 MG TABS tablet Take 1 tablet by mouth every Monday, Wednesday, and Friday.    . furosemide (LASIX) 40 MG tablet Take 40 mg by mouth every Tuesday, Thursday, Saturday, and Sunday.     . hydrALAZINE (APRESOLINE) 25 MG tablet Take 25  mg by mouth 2 (two) times daily.     . metoprolol (LOPRESSOR) 50 MG tablet Take 50 mg by mouth 2 (two) times daily.    . ramipril (ALTACE) 10 MG capsule Take 10 mg by mouth at bedtime.     . sevelamer carbonate (RENVELA) 800 MG tablet Take 2,400 mg by mouth 3 (three) times daily with meals.      No current facility-administered medications on file prior to visit.     There are no Patient Instructions on file for this visit. No Follow-up on file.   KIMBERLY A STEGMAYER, PA-C

## 2017-01-03 DIAGNOSIS — R253 Fasciculation: Secondary | ICD-10-CM | POA: Insufficient documentation

## 2017-03-07 ENCOUNTER — Encounter (INDEPENDENT_AMBULATORY_CARE_PROVIDER_SITE_OTHER): Payer: Self-pay | Admitting: Vascular Surgery

## 2017-03-07 ENCOUNTER — Ambulatory Visit (INDEPENDENT_AMBULATORY_CARE_PROVIDER_SITE_OTHER): Payer: Medicare Other

## 2017-03-07 ENCOUNTER — Ambulatory Visit (INDEPENDENT_AMBULATORY_CARE_PROVIDER_SITE_OTHER): Payer: Medicare Other | Admitting: Vascular Surgery

## 2017-03-07 VITALS — BP 229/97 | HR 74 | Resp 16 | Ht 68.0 in | Wt 185.0 lb

## 2017-03-07 DIAGNOSIS — Z992 Dependence on renal dialysis: Secondary | ICD-10-CM | POA: Diagnosis not present

## 2017-03-07 DIAGNOSIS — N186 End stage renal disease: Secondary | ICD-10-CM | POA: Diagnosis not present

## 2017-03-07 DIAGNOSIS — I10 Essential (primary) hypertension: Secondary | ICD-10-CM | POA: Diagnosis not present

## 2017-03-07 NOTE — Patient Instructions (Signed)
Vascular Access for Hemodialysis A vascular access is a connection between two blood vessels that allows blood to be easily removed from the body and returned to the body during hemodialysis. Hemodialysis is a procedure in which a machine outside of the body filters the blood. There are three types of vascular accesses:  Arteriovenous fistula. This is a connection between an artery and a vein (usually in the arm) that is made by sewing them together. Blood in the artery flows directly into the vein, causing it to get larger over time. This makes it easier for the vein to be used for hemodialysis. An arteriovenous fistula takes 1-6 months to develop after surgery.  Arteriovenous graft. This is a connection between an artery and a vein in the arm that is made with a tube. An arteriovenous graft can be used within 2-3 weeks of surgery.  Venous catheter. This is a thin, flexible tube that is placed in a large vein (usually in the neck, chest, or groin). A venous catheter for hemodialysis contains two tubes that come out of the skin. A venous catheter can be used right away. It is usually used as a temporary access if you need hemodialysis before a fistula or graft has developed. It may also be used as a permanent access if a fistula or graft cannot be created.  Which type of access is best for me? The type of access that is best for you depends on the size and strength of your veins. A fistula is usually the preferred type of access. It can last several years and is less likely than the other types of accesses to become infected or to cause blood clots within a blood vessel (thrombosis). However, a fistula is not an option for everyone. If your veins are not the right size, a graft may be used instead. Grafts require you to have strong veins. If your veins are not strong enough for a graft, a catheter may be used. Catheters are more likely than fistulas and grafts to become infected or to have  thrombosis. Sometimes, only one type of access is an option. Your health care provider will help you determine which type of access is best for you. How is a vascular access used? The way the access is used depends on the type of access:  If the access is a fistula or graft, two needles are inserted through the skin into the access before each hemodialysis session. Blood leaves the body through one of the needles and travels through a tube to the hemodialysis machine (dialyzer). It then flows through another tube and returns to the body through the second needle.  If the access is a catheter, one tube is connected directly to the tube that leads to the dialyzer and the other is connected to a tube that leads away from the dialyzer. Blood leaves the body through one tube and returns to the body through the other.  What kind of problems can occur with vascular accesses?  Blood clots within a blood vessel (thrombosis). Thrombosis can lead to a narrowing of a blood vessel or tube (stenosis). If thrombosis occurs frequently, another access site may be created as a backup.  Infection. These problems are most likely to occur with a venous catheter and least likely to occur with an arteriovenous fistula. How do I care for my vascular access? Wear a medical alert bracelet. This tells health care providers that you are a dialysis patient in the case of an emergency and   allows them to care for your veins appropriately. If you have a graft or fistula:  A "bruit" is a noise that is heard with a stethoscope and a "thrill" is a vibration felt over the graft or fistula. The presence of the bruit and thrill indicates that the access is working. You will be taught to feel for the thrill each day. If this is not felt, the access may be clotted. Call your health care provider.  You may use the arm where your vascular access is located freely after the site heals. Keep the following in mind: ? Avoid pressure on the  arm. ? Avoid lifting heavy objects with the arm. ? Avoid sleeping on the arm. ? Avoid wearing tight-sleeved shirts or jewelry around the graft or fistula.  Do not allow blood pressure monitoring or needle punctures on the side where the graft or fistula is located.  With permission from your health care provider, you may do exercises to help with blood flow through a fistula. These exercises involve squeezing a rubber ball or other soft objects as instructed.  Contact a health care provider if:  Chills develop.  You have an oral temperature above 102 F (38.9 C).  Swelling around the graft or fistula gets worse.  New pain develops.  Pus or other fluid (drainage) is seen at the vascular access site.  Skin redness or red streaking is seen on the skin around, above, or below the vascular access. Get help right away if:  Pain, numbness, or an unusual pale skin color develops in the hand on the side of your fistula.  Dizziness or weakness develops that you have not had before.  The vascular access has bleeding that cannot be easily controlled. This information is not intended to replace advice given to you by your health care provider. Make sure you discuss any questions you have with your health care provider. Document Released: 07/16/2002 Document Revised: 10/01/2015 Document Reviewed: 09/11/2012 Elsevier Interactive Patient Education  2017 [employer]  

## 2017-03-07 NOTE — Progress Notes (Signed)
MRN : [id number]  Johnny [name]. is a 66 y.o. (1951/04/16) male who presents with chief complaint of  Chief Complaint  Patient presents with  . Follow-up    9mos HDA  .  History of Present Illness: Patient returns today in follow up of his dialysis access.  He says this is working fine at dialysis.  They occasionally have some mild difficulties with access, but the flow rates are good and there has not been prolonged bleeding.  His duplex today does show elevated velocities at the venous anastomosis that would follow at least in the moderate range.  There is also some questionable elevated velocities in his arterial circuit.  Current Outpatient Prescriptions  Medication Sig Dispense Refill  . allopurinol (ZYLOPRIM) 100 MG tablet Take 100 mg by mouth daily.    [name] aspirin EC 81 MG tablet Take 1 tablet (81 mg total) by mouth daily. 150 tablet 2  . calcium acetate (PHOSLO) 667 MG capsule Take 1,334 mg by mouth 3 (three) times daily with meals.    . cinacalcet (SENSIPAR) 90 MG tablet Take 90 mg by mouth 2 (two) times daily.     . clonazePAM (KLONOPIN) 0.5 MG tablet Take 0.5 mg by mouth at bedtime.    . clopidogrel (PLAVIX) 75 MG tablet Take 1 tablet (75 mg total) by mouth daily. 30 tablet 11  . colchicine 0.6 MG tablet Take 0.6 mg by mouth daily as needed (for gout flares).     . folic acid-vitamin b complex-vitamin c-selenium-zinc (DIALYVITE) 3 MG TABS tablet Take 1 tablet by mouth every Monday, Wednesday, and Friday.    . furosemide (LASIX) 40 MG tablet Take 40 mg by mouth every Tuesday, Thursday, Saturday, and Sunday.     . hydrALAZINE (APRESOLINE) 25 MG tablet Take 25 mg by mouth 2 (two) times daily.     . metoprolol (LOPRESSOR) 50 MG tablet Take 50 mg by mouth 2 (two) times daily.    . ramipril (ALTACE) 10 MG capsule Take 10 mg by mouth at bedtime.     . sevelamer carbonate (RENVELA) 800 MG tablet Take 2,400 mg by mouth 3 (three) times daily with meals.      No current  facility-administered medications for this visit.     Past Medical History:  Diagnosis Date  . Anemia   . CHF (congestive heart failure) ([place])   . Chronic kidney disease   . Gout   . Hyperlipidemia   . Hypertension     Past Surgical History:  Procedure Laterality Date  . AV FISTULA PLACEMENT Left 09/18/2015   Procedure: INSERTION OF ARTERIOVENOUS (AV) GORE-TEX GRAFT ARM ( BRACH/AXILLARY GRAFT W/ INSTANT STICK GRAFT );  Surgeon: Katha Cabal, MD;  Location: [facility];  Service: Vascular;  Laterality: Left;  . DIALYSIS FISTULA CREATION    . PERIPHERAL VASCULAR CATHETERIZATION Left 09/01/2015   Procedure: A/V Shuntogram/Fistulagram;  Surgeon: Katha Cabal, MD;  Location: [place] [facility];  Service: Cardiovascular;  Laterality: Left;  . PERIPHERAL VASCULAR CATHETERIZATION N/A 09/30/2015   Procedure: A/V Shuntogram/Fistulagram with perm cathether removal;  Surgeon: Algernon Huxley, MD;  Location: [place] [facility];  Service: Cardiovascular;  Laterality: N/A;  . PERIPHERAL VASCULAR CATHETERIZATION Left 09/30/2015   Procedure: A/V Shunt Intervention;  Surgeon: Algernon Huxley, MD;  Location: [place] [facility];  Service: Cardiovascular;  Laterality: Left;  . PERIPHERAL VASCULAR CATHETERIZATION Left 12/03/2015   Procedure: Thrombectomy;  Surgeon: Algernon Huxley, MD;  Location: [place]  [facility];  Service: Cardiovascular;  Laterality: Left;  . PERIPHERAL VASCULAR CATHETERIZATION Left 01/28/2016   Procedure: Thrombectomy;  Surgeon: Algernon Huxley, MD;  Location: [place] [facility];  Service: Cardiovascular;  Laterality: Left;  . PERIPHERAL VASCULAR CATHETERIZATION N/A 01/28/2016   Procedure: A/V Shuntogram/Fistulagram;  Surgeon: Algernon Huxley, MD;  Location: [place] [facility];  Service: Cardiovascular;  Laterality: N/A;    Social History Social History  Substance Use Topics  . Smoking status: Former Research scientist (life sciences)  . Smokeless tobacco: Never Used  . Alcohol use No     Family History Family  History  Problem Relation Age of Onset  . Hypertension Unknown   . Heart disease Unknown      Allergies  Allergen Reactions  . Shellfish Allergy Anaphylaxis     REVIEW OF SYSTEMS (Negative unless checked)  Constitutional: [] Weight loss  [] Fever  [] Chills Cardiac: [] Chest pain   [] Chest pressure   [] Palpitations   [] Shortness of breath when laying flat   [] Shortness of breath at rest   [] Shortness of breath with exertion. Vascular:  [] Pain in legs with walking   [] Pain in legs at rest   [] Pain in legs when laying flat   [] Claudication   [] Pain in feet when walking  [] Pain in feet at rest  [] Pain in feet when laying flat   [] History of DVT   [] Phlebitis   [] Swelling in legs   [] Varicose veins   [] Non-healing ulcers Pulmonary:   [] Uses home oxygen   [] Productive cough   [] Hemoptysis   [] Wheeze  [] COPD   [] Asthma Neurologic:  [] Dizziness  [] Blackouts   [] Seizures   [] History of stroke   [] History of TIA  [] Aphasia   [] Temporary blindness   [] Dysphagia   [] Weakness or numbness in arms   [] Weakness or numbness in legs Musculoskeletal:  [x] Arthritis   [] Joint swelling   [] Joint pain   [] Low back pain Hematologic:  [] Easy bruising  [] Easy bleeding   [] Hypercoagulable state   [x] Anemic   Gastrointestinal:  [] Blood in stool   [] Vomiting blood  [] Gastroesophageal reflux/heartburn   [] Abdominal pain Genitourinary:  [x] Chronic kidney disease   [] Difficult urination  [] Frequent urination  [] Burning with urination   [] Hematuria Skin:  [] Rashes   [] Ulcers   [] Wounds Psychological:  [] History of anxiety   []  History of major depression.  Physical Examination  BP (!) 229/97 (BP Location: Right Arm)   Pulse 74   Resp 16   Ht 5\' 8"  (1.727 m)   Wt 83.9 kg (185 lb)   BMI 28.13 kg/m  Gen:  WD/WN, NAD Head: [place]/AT, No temporalis wasting. Ear/Nose/Throat: Hearing grossly intact, nares w/o erythema or drainage, trachea midline Eyes: Conjunctiva clear. Sclera non-icteric Neck: Supple.  No JVD.    Pulmonary:  Good air movement, no use of accessory muscles.  Cardiac: RRR, normal S1, S2 Vascular: Good thrill in left upper arm AV graft Vessel Right Left  Radial Palpable Palpable                                    Musculoskeletal: M/S 5/5 throughout.  No deformity or atrophy. 1+ BLE edema. Neurologic: Sensation grossly intact in extremities.  Symmetrical.  Speech is fluent.  Psychiatric: Judgment intact, Mood & affect appropriate for pt's clinical situation. Dermatologic: No rashes or ulcers noted.  No cellulitis or open wounds.       Labs No results found for this or  any previous visit (from the past 2160 hour(s)).  Radiology No results found.    Assessment/Plan  HTN (hypertension) Quite high today.  His control is suboptimal and blood pressure control important in reducing the progression of atherosclerotic disease. On appropriate oral medications.   ESRD on dialysis [facility]) His duplex today does show elevated velocities at the venous anastomosis that would follow at least in the moderate range.  There is also some questionable elevated velocities in his arterial circuit. Clinically, his axis is working okay but with these elevated velocities I believe we need to shorten his follow-up.  I will plan to see him back in about 3-4 months with a duplex.  Should clinical issues develop in the interim, a shuntogram with intervention would be recommended.    Leotis Pain, MD  03/07/2017 1:02 PM    This note was created with Dragon medical transcription system.  Any errors from dictation are purely unintentional

## 2017-03-07 NOTE — Assessment & Plan Note (Signed)
Quite high today.  His control is suboptimal and blood pressure control important in reducing the progression of atherosclerotic disease. On appropriate oral medications.

## 2017-03-07 NOTE — Assessment & Plan Note (Signed)
His duplex today does show elevated velocities at the venous anastomosis that would follow at least in the moderate range.  There is also some questionable elevated velocities in his arterial circuit. Clinically, his axis is working okay but with these elevated velocities I believe we need to shorten his follow-up.  I will plan to see him back in about 3-4 months with a duplex.  Should clinical issues develop in the interim, a shuntogram with intervention would be recommended.

## 2017-06-02 ENCOUNTER — Encounter (INDEPENDENT_AMBULATORY_CARE_PROVIDER_SITE_OTHER): Payer: Self-pay

## 2017-06-07 ENCOUNTER — Other Ambulatory Visit (INDEPENDENT_AMBULATORY_CARE_PROVIDER_SITE_OTHER): Payer: Self-pay | Admitting: Vascular Surgery

## 2017-06-14 MED ORDER — CEFAZOLIN SODIUM-DEXTROSE 1-4 GM/50ML-% IV SOLN: 1.0000 g | Freq: Once | INTRAVENOUS | Status: DC

## 2017-06-15 ENCOUNTER — Encounter (INDEPENDENT_AMBULATORY_CARE_PROVIDER_SITE_OTHER): Payer: Self-pay

## 2017-06-15 ENCOUNTER — Other Ambulatory Visit (INDEPENDENT_AMBULATORY_CARE_PROVIDER_SITE_OTHER): Payer: Self-pay | Admitting: Vascular Surgery

## 2017-06-20 ENCOUNTER — Ambulatory Visit (INDEPENDENT_AMBULATORY_CARE_PROVIDER_SITE_OTHER): Payer: Medicare Other | Admitting: Vascular Surgery

## 2017-06-20 ENCOUNTER — Encounter (INDEPENDENT_AMBULATORY_CARE_PROVIDER_SITE_OTHER): Payer: Medicare Other

## 2017-06-21 ENCOUNTER — Encounter: Payer: Self-pay | Admitting: *Deleted

## 2017-06-21 ENCOUNTER — Ambulatory Visit
Admission: RE | Admit: 2017-06-21 | Discharge: 2017-06-21 | Disposition: A | Discharge status: Home / Self Care | Payer: Medicare Other | Source: Ambulatory Visit | Attending: Vascular Surgery | Admitting: Vascular Surgery

## 2017-06-21 ENCOUNTER — Encounter
Admission: RE | Disposition: A | Discharge status: Home / Self Care | Payer: Self-pay | Source: Ambulatory Visit | Attending: Vascular Surgery

## 2017-06-21 DIAGNOSIS — N186 End stage renal disease: Secondary | ICD-10-CM | POA: Diagnosis not present

## 2017-06-21 DIAGNOSIS — Z87891 Personal history of nicotine dependence: Secondary | ICD-10-CM | POA: Insufficient documentation

## 2017-06-21 DIAGNOSIS — Z992 Dependence on renal dialysis: Secondary | ICD-10-CM | POA: Diagnosis not present

## 2017-06-21 DIAGNOSIS — T82858A Stenosis of vascular prosthetic devices, implants and grafts, initial encounter: Secondary | ICD-10-CM | POA: Diagnosis present

## 2017-06-21 DIAGNOSIS — I509 Heart failure, unspecified: Secondary | ICD-10-CM | POA: Insufficient documentation

## 2017-06-21 DIAGNOSIS — Z91013 Allergy to seafood: Secondary | ICD-10-CM | POA: Insufficient documentation

## 2017-06-21 DIAGNOSIS — Y832 Surgical operation with anastomosis, bypass or graft as the cause of abnormal reaction of the patient, or of later complication, without mention of misadventure at the time of the procedure: Secondary | ICD-10-CM | POA: Diagnosis not present

## 2017-06-21 DIAGNOSIS — M109 Gout, unspecified: Secondary | ICD-10-CM | POA: Diagnosis not present

## 2017-06-21 DIAGNOSIS — I10 Essential (primary) hypertension: Secondary | ICD-10-CM | POA: Diagnosis not present

## 2017-06-21 DIAGNOSIS — I132 Hypertensive heart and chronic kidney disease with heart failure and with stage 5 chronic kidney disease, or end stage renal disease: Secondary | ICD-10-CM | POA: Diagnosis not present

## 2017-06-21 DIAGNOSIS — E785 Hyperlipidemia, unspecified: Secondary | ICD-10-CM | POA: Insufficient documentation

## 2017-06-21 DIAGNOSIS — Z8249 Family history of ischemic heart disease and other diseases of the circulatory system: Secondary | ICD-10-CM | POA: Diagnosis not present

## 2017-06-21 DIAGNOSIS — T82868A Thrombosis of vascular prosthetic devices, implants and grafts, initial encounter: Secondary | ICD-10-CM | POA: Diagnosis not present

## 2017-06-21 DIAGNOSIS — Z9889 Other specified postprocedural states: Secondary | ICD-10-CM | POA: Diagnosis not present

## 2017-06-21 HISTORY — PX: A/V SHUNTOGRAM: CATH118297

## 2017-06-21 LAB — POTASSIUM (ARMC VASCULAR LAB ONLY): POTASSIUM (ARMC VASCULAR LAB): 3.5 (ref 3.5–5.1)

## 2017-06-21 SURGERY — A/V SHUNTOGRAM
Anesthesia: Moderate Sedation | Laterality: Left

## 2017-06-21 MED ORDER — FAMOTIDINE 20 MG PO TABS
ORAL_TABLET | ORAL | Status: AC
  Filled 2017-06-21: qty 2

## 2017-06-21 MED ORDER — CEFAZOLIN SODIUM-DEXTROSE 1-4 GM/50ML-% IV SOLN
INTRAVENOUS | Status: DC
Start: 2017-06-21 — End: 2017-06-21
  Filled 2017-06-21: qty 50

## 2017-06-21 MED ORDER — SODIUM CHLORIDE 0.9 % IV SOLN
INTRAVENOUS | Status: DC
  Administered 2017-06-21: 11:00:00 via INTRAVENOUS

## 2017-06-21 MED ORDER — FENTANYL CITRATE (PF) 100 MCG/2ML IJ SOLN
INTRAMUSCULAR | Status: AC
  Filled 2017-06-21: qty 2

## 2017-06-21 MED ORDER — FENTANYL CITRATE (PF) 100 MCG/2ML IJ SOLN
INTRAMUSCULAR | Status: DC | PRN
  Administered 2017-06-21: 50 ug via INTRAVENOUS
  Administered 2017-06-21: 25 ug via INTRAVENOUS

## 2017-06-21 MED ORDER — HYDROMORPHONE HCL 1 MG/ML IJ SOLN: 1.0000 mg | Freq: Once | INTRAMUSCULAR | Status: DC | PRN

## 2017-06-21 MED ORDER — IOPAMIDOL (ISOVUE-300) INJECTION 61%
INTRAVENOUS | Status: DC | PRN
  Administered 2017-06-21: 25 mL via INTRA_ARTERIAL

## 2017-06-21 MED ORDER — MIDAZOLAM HCL 5 MG/5ML IJ SOLN
INTRAMUSCULAR | Status: AC
  Filled 2017-06-21: qty 5

## 2017-06-21 MED ORDER — HEPARIN SODIUM (PORCINE) 1000 UNIT/ML IJ SOLN
INTRAMUSCULAR | Status: AC
  Filled 2017-06-21: qty 1

## 2017-06-21 MED ORDER — LIDOCAINE HCL (PF) 1 % IJ SOLN
INTRAMUSCULAR | Status: AC
  Filled 2017-06-21: qty 30

## 2017-06-21 MED ORDER — METHYLPREDNISOLONE SODIUM SUCC 125 MG IJ SOLR
125.0000 mg | INTRAMUSCULAR | Status: DC | PRN
  Administered 2017-06-21: 125 mg via INTRAVENOUS

## 2017-06-21 MED ORDER — HEPARIN SODIUM (PORCINE) 1000 UNIT/ML IJ SOLN
INTRAMUSCULAR | Status: DC | PRN
  Administered 2017-06-21: 3000 [IU] via INTRAVENOUS

## 2017-06-21 MED ORDER — HEPARIN (PORCINE) IN NACL 2-0.9 UNIT/ML-% IJ SOLN
INTRAMUSCULAR | Status: AC
  Filled 2017-06-21: qty 1000

## 2017-06-21 MED ORDER — FAMOTIDINE 20 MG PO TABS
40.0000 mg | ORAL_TABLET | ORAL | Status: DC | PRN
  Administered 2017-06-21: 40 mg via ORAL

## 2017-06-21 MED ORDER — ONDANSETRON HCL 4 MG/2ML IJ SOLN: 4.0000 mg | Freq: Four times a day (QID) | INTRAMUSCULAR | Status: DC | PRN

## 2017-06-21 MED ORDER — MIDAZOLAM HCL 2 MG/2ML IJ SOLN
INTRAMUSCULAR | Status: DC | PRN
  Administered 2017-06-21: 2 mg via INTRAVENOUS
  Administered 2017-06-21: 1 mg via INTRAVENOUS

## 2017-06-21 MED ORDER — METHYLPREDNISOLONE SODIUM SUCC 125 MG IJ SOLR
INTRAMUSCULAR | Status: AC
  Filled 2017-06-21: qty 2

## 2017-06-21 SURGICAL SUPPLY — 17 items
BALLN DORADO 9X40X80 (BALLOONS) ×2
BALLN LUTONIX AV 12X40X75 (BALLOONS) ×2
BALLOON DORADO 9X40X80 (BALLOONS) ×1 IMPLANT
BALLOON LUTONIX AV 12X40X75 (BALLOONS) ×1 IMPLANT
DEVICE PRESTO INFLATION (MISCELLANEOUS) ×2 IMPLANT
DRAPE BRACHIAL (DRAPES) ×2 IMPLANT
NEEDLE ENTRY 21GA 7CM ECHOTIP (NEEDLE) ×2 IMPLANT
PACK ANGIOGRAPHY (CUSTOM PROCEDURE TRAY) ×2 IMPLANT
SET INTRO CAPELLA COAXIAL (SET/KITS/TRAYS/PACK) ×2 IMPLANT
SHEATH BRITE TIP 6FRX5.5 (SHEATH) ×2 IMPLANT
SHEATH BRITE TIP 7FRX5.5 (SHEATH) ×4 IMPLANT
SHEATH BRITE TIP 8FRX11 (SHEATH) ×2 IMPLANT
STENT COVERA FLARED 9X60X80 (Permanent Stent) ×2 IMPLANT
SUT MNCRL AB 4-0 PS2 18 (SUTURE) ×2 IMPLANT
TOWEL OR 17X26 4PK STRL BLUE (TOWEL DISPOSABLE) ×2 IMPLANT
WIRE MAGIC TOR.035 180C (WIRE) ×2 IMPLANT
WIRE MAGIC TORQUE 260C (WIRE) ×2 IMPLANT

## 2017-06-21 NOTE — Op Note (Signed)
OPERATIVE NOTE   PROCEDURE: 1. Contrast injection left brachial axillary AV access 2. Percutaneous transluminal angioplasty and stent placement peripheral segment to 9 mm 3. Percutaneous transluminal angioplasty central venous segment to 12 mm with a Lutonix drug-eluting balloon  PRE-OPERATIVE DIAGNOSIS: Complication of dialysis access                                                       End Stage Renal Disease  POST-OPERATIVE DIAGNOSIS: same as above   SURGEON: Katha Cabal, M.D.  ANESTHESIA: Conscious sedation was administered under my direct supervision by the interventional radiology RN. IV Versed plus fentanyl were utilized. Continuous ECG, pulse oximetry and blood pressure was monitored throughout the entire procedure.  Conscious sedation was for a total of 31.  ESTIMATED BLOOD LOSS: minimal  FINDING(S): Stricture of the AV graft within the peripheral segment as well as a stricture within the central venous portion  SPECIMEN(S):  None  CONTRAST: 30 cc  FLUOROSCOPY TIME: 3 minutes  INDICATIONS: Johnny [name]. is a 67 y.o. male who  presents with malfunctioning left arm AV access.  The patient is scheduled for angiography with possible intervention of the AV access.  The patient is aware the risks include but are not limited to: bleeding, infection, thrombosis of the cannulated access, and possible anaphylactic reaction to the contrast.  The patient acknowledges if the access can not be salvaged a tunneled catheter will be needed and will be placed during this procedure.  The patient is aware of the risks of the procedure and elects to proceed with the angiogram and intervention.  DESCRIPTION: After full informed written consent was obtained, the patient was brought back to the Special Procedure suite and placed supine position.  Appropriate cardiopulmonary monitors were placed.  The left arm was prepped and draped in the standard fashion.  Appropriate timeout is  called. The left brachial axillary AV graft was cannulated with a micropuncture needle in an antegrade direction.  Cannulation was performed with ultrasound guidance. Ultrasound was placed in a sterile sleeve, the AV access was interrogated and noted to be echolucent and compressible indicating patency. Image was recorded for the permanent record. The puncture is performed under continuous ultrasound visualization.   The microwire was advanced and the needle was exchanged for  a microsheath.  The J-wire was then advanced and a 6 Fr sheath inserted.  Hand injections were completed to image the access from the arterial anastomosis through the entire access.  The central venous structures were also imaged by hand injections.  Based on the images,  3000 units of heparin was given and a wire was negotiated through the strictures within the venous portion of the graft as well as the central stenosis. The sheath was then upsized to a 7 [place] sheath and subsequently an 8 [place] sheath later in the case.  The 90% stricture of the venous outflow of the AV graft was addressed first.  A 9 mm x 60 mm Covera flared stent was then deployed across the venous outflow.  An 9 mm x 40 mm [place] balloon was used.  Inflation was to 26 atm for 1 minute.  The detector was then repositioned over the central portion 9 mm [place] balloon balloon was used to treat the stricture within the innominate vein.  Follow-up imaging demonstrated greater  than 50% residual stenosis and it was at this point that the sheath was upsized to an 8 [place] sheath.  A 12 mm x 40 mm Lutonix drug-eluting balloon was then advanced across the left innominate vein.  Inflation was to 12 atm for 2 minutes.  Follow-up imaging demonstrates significant improvement with a marked reduction of the strictures with less than 20% residual stenosis within the left innominate and less than 5% residual stenosis at the venous outflow.  There is now rapid flow of contrast through  the graft and the central veins.   A 4-0 Monocryl purse-string suture was sewn around the sheath.  The sheath was removed and light pressure was applied.  A sterile bandage was applied to the puncture site.    COMPLICATIONS: None  CONDITION: Johnny [name], M.D [facility] Office: [phone]  06/21/2017 1:21 PM

## 2017-06-21 NOTE — Discharge Instructions (Signed)

## 2017-06-21 NOTE — H&P (Signed)
[facility] Admission History & Physical  MRN : [id number]  Johnny [name]. is a 67 y.o. ([birth year]) male who presents with chief complaint of No chief complaint on file. [name]  History of Present Illness: I am asked to evaluate the patient by the dialysis center. The patient was sent here because they were unable to achieve adequate dialysis this morning. Furthermore the Center states there is very poor thrill and bruit. The patient states there there have been increasing problems with the access, such as "pulling clots" during dialysis and prolonged bleeding after decannulation. The patient estimates these problems have been going on for several weeks. The patient is unaware of any other change.  Patient denies pain or tenderness overlying the access.  There is no pain with dialysis.  The patient denies hand pain or finger pain consistent with steal syndrome.   There have been several past interventions and declots of this access.  The patient is not chronically hypotensive on dialysis.  Current Facility-Administered Medications  Medication Dose Route Frequency Provider Last Rate Last Dose  . ceFAZolin (ANCEF) 1-4 GM/50ML-% IVPB           . famotidine (PEPCID) 20 MG tablet           . methylPREDNISolone sodium succinate (SOLU-MEDROL) 125 mg/2 mL injection           . 0.9 %  sodium chloride infusion   Intravenous Continuous Stegmayer, Kimberly A, PA-C 10 mL/hr at 06/21/17 1118    . famotidine (PEPCID) tablet 40 mg  40 mg Oral PRN Stegmayer, Kimberly A, PA-C   40 mg at 06/21/17 1117  . HYDROmorphone (DILAUDID) injection 1 mg  1 mg Intravenous Once PRN Stegmayer, Kimberly A, PA-C      . methylPREDNISolone sodium succinate (SOLU-MEDROL) 125 mg/2 mL injection 125 mg  125 mg Intravenous PRN Stegmayer, Kimberly A, PA-C   125 mg at 06/21/17 1117  . ondansetron (ZOFRAN) injection 4 mg  4 mg Intravenous Q6H PRN Stegmayer, Janalyn Harder, PA-C        Past Medical History:   Diagnosis Date  . Anemia   . CHF (congestive heart failure) ([place])   . Chronic kidney disease   . Gout   . Hyperlipidemia   . Hypertension     Past Surgical History:  Procedure Laterality Date  . AV FISTULA PLACEMENT Left 09/18/2015   Procedure: INSERTION OF ARTERIOVENOUS (AV) GORE-TEX GRAFT ARM ( BRACH/AXILLARY GRAFT W/ INSTANT STICK GRAFT );  Surgeon: Katha Cabal, MD;  Location: [facility];  Service: Vascular;  Laterality: Left;  . DIALYSIS FISTULA CREATION    . PERIPHERAL VASCULAR CATHETERIZATION Left 09/01/2015   Procedure: A/V Shuntogram/Fistulagram;  Surgeon: Katha Cabal, MD;  Location: [place] [facility];  Service: Cardiovascular;  Laterality: Left;  . PERIPHERAL VASCULAR CATHETERIZATION N/A 09/30/2015   Procedure: A/V Shuntogram/Fistulagram with perm cathether removal;  Surgeon: Algernon Huxley, MD;  Location: [place] [facility];  Service: Cardiovascular;  Laterality: N/A;  . PERIPHERAL VASCULAR CATHETERIZATION Left 09/30/2015   Procedure: A/V Shunt Intervention;  Surgeon: Algernon Huxley, MD;  Location: [place] [facility];  Service: Cardiovascular;  Laterality: Left;  . PERIPHERAL VASCULAR CATHETERIZATION Left 12/03/2015   Procedure: Thrombectomy;  Surgeon: Algernon Huxley, MD;  Location: [place] [facility];  Service: Cardiovascular;  Laterality: Left;  . PERIPHERAL VASCULAR CATHETERIZATION Left 01/28/2016   Procedure: Thrombectomy;  Surgeon: Algernon Huxley, MD;  Location: [place] [facility];  Service: Cardiovascular;  Laterality: Left;  . PERIPHERAL VASCULAR CATHETERIZATION N/A 01/28/2016   Procedure: A/V Shuntogram/Fistulagram;  Surgeon: Algernon Huxley, MD;  Location: [place] [facility];  Service: Cardiovascular;  Laterality: N/A;    Social History Social History   Tobacco Use  . Smoking status: Former Research scientist (life sciences)  . Smokeless tobacco: Never Used  Substance Use Topics  . Alcohol use: No  . Drug use: No    Family History Family History  Problem Relation Age of Onset  .  Hypertension Unknown   . Heart disease Unknown     No family history of bleeding or clotting disorders, autoimmune disease or porphyria  Allergies  Allergen Reactions  . Shellfish Allergy Anaphylaxis     REVIEW OF SYSTEMS (Negative unless checked)  Constitutional: [] Weight loss  [] Fever  [] Chills Cardiac: [] Chest pain   [] Chest pressure   [] Palpitations   [] Shortness of breath when laying flat   [] Shortness of breath at rest   [x] Shortness of breath with exertion. Vascular:  [] Pain in legs with walking   [] Pain in legs at rest   [] Pain in legs when laying flat   [] Claudication   [] Pain in feet when walking  [] Pain in feet at rest  [] Pain in feet when laying flat   [] History of DVT   [] Phlebitis   [] Swelling in legs   [] Varicose veins   [] Non-healing ulcers Pulmonary:   [] Uses home oxygen   [] Productive cough   [] Hemoptysis   [] Wheeze  [] COPD   [] Asthma Neurologic:  [] Dizziness  [] Blackouts   [] Seizures   [] History of stroke   [] History of TIA  [] Aphasia   [] Temporary blindness   [] Dysphagia   [] Weakness or numbness in arms   [] Weakness or numbness in legs Musculoskeletal:  [] Arthritis   [] Joint swelling   [] Joint pain   [] Low back pain Hematologic:  [] Easy bruising  [] Easy bleeding   [] Hypercoagulable state   [] Anemic  [] Hepatitis Gastrointestinal:  [] Blood in stool   [] Vomiting blood  [] Gastroesophageal reflux/heartburn   [] Difficulty swallowing. Genitourinary:  [x] Chronic kidney disease   [] Difficult urination  [] Frequent urination  [] Burning with urination   [] Blood in urine Skin:  [] Rashes   [] Ulcers   [] Wounds Psychological:  [] History of anxiety   []  History of major depression.  Physical Examination  Vitals:   06/21/17 1045  BP: (!) 174/77  Pulse: 73  Resp: 20  Temp: 97.9 F (36.6 C)  TempSrc: Oral  SpO2: 99%  Weight: 83.9 kg (185 lb)  Height: 5\' 8"  (1.727 m)   Body mass index is 28.13 kg/m. Gen: WD/WN, NAD Head: [place]/AT, No temporalis wasting. Prominent temp pulse not  noted. Ear/Nose/Throat: Hearing grossly intact, nares w/o erythema or drainage, oropharynx w/o Erythema/Exudate,  Eyes: Conjunctiva clear, sclera non-icteric Neck: Trachea midline.  No JVD.  Pulmonary:  Good air movement, respirations not labored, no use of accessory muscles.  Cardiac: RRR, normal S1, S2. Vascular: Left arm brachial axillary AV graft poor thrill poor bruit Vessel Right Left  Radial Palpable Palpable  Ulnar Not Palpable Not Palpable  Brachial Palpable Palpable  Carotid Palpable, without bruit Palpable, without bruit  Gastrointestinal: soft, non-tender/non-distended. No guarding/reflex.  Musculoskeletal: M/S 5/5 throughout.  Extremities without ischemic changes.  No deformity or atrophy.  Neurologic: Sensation grossly intact in extremities.  Symmetrical.  Speech is fluent. Motor exam as listed above. Psychiatric: Judgment intact, Mood & affect appropriate for pt's clinical situation. Dermatologic: No rashes or ulcers noted.  No cellulitis or open wounds. Lymph : No Cervical, Axillary, or  Inguinal lymphadenopathy.   CBC Lab Results  Component Value Date   WBC 11.4 (H) 10/02/2015   HGB 8.7 (L) 10/02/2015   HCT 27.8 (L) 10/02/2015   MCV 88.1 10/02/2015   PLT 164 10/02/2015    BMET    Component Value Date/Time   NA 139 10/02/2015 0512   NA 139 01/17/2014 0502   K 4.5 12/02/2015 0809   K 4.8 01/17/2014 0502   CL 102 10/02/2015 0512   CL 102 01/17/2014 0502   CO2 25 10/02/2015 0512   CO2 27 01/17/2014 0502   GLUCOSE 79 10/02/2015 0512   GLUCOSE 101 (H) 01/17/2014 0502   BUN 54 (H) 10/02/2015 0512   BUN 62 (H) 01/17/2014 0502   CREATININE 11.56 (H) 10/02/2015 0512   CREATININE 12.30 (H) 01/17/2014 0502   CALCIUM 8.0 (L) 10/02/2015 0512   CALCIUM 8.2 (L) 01/17/2014 0502   GFRNONAA 4 (L) 10/02/2015 0512   GFRNONAA 4 (L) 01/17/2014 0502   GFRAA 5 (L) 10/02/2015 0512   GFRAA 4 (L) 01/17/2014 0502   CrCl cannot be calculated (Patient's most recent lab result  is older than the maximum 21 days allowed.).  COAG Lab Results  Component Value Date   INR 1.18 09/28/2015   INR 1.04 09/15/2015   INR 1.0 06/11/2011    Radiology No results found.  Assessment/Plan 1.  Complication dialysis device with thrombosis AV access:  Patient's left arm brachial axillary dialysis access is malfunctioning. The patient will undergo angiography and correction of any problems using interventional techniques with the hope of restoring function to the access.  The risks and benefits were described to the patient.  All questions were answered.  The patient agrees to proceed with angiography and intervention. Potassium will be drawn to ensure that it is an appropriate level prior to performing intervention. 2.  End-stage renal disease requiring hemodialysis:  Patient will continue dialysis therapy without further interruption if a successful intervention is not achieved then a tunneled catheter will be placed. Dialysis has already been arranged. 3.  Hypertension:  Patient will continue medical management; nephrology is following no changes in oral medications. 4.  Congestive heart failure:  EKG will be monitored. Nitrates will be used if needed. The patient's oral cardiac medications will be continued.    Hortencia Pilar, MD  06/21/2017 11:48 AM

## 2017-06-21 NOTE — Progress Notes (Signed)
Patient clinically stable post procedure. Eating lunch, discharge instructions given with questions answered. Dr Delana Meyer out to speak with patient with questions answered.

## 2017-06-23 ENCOUNTER — Encounter: Payer: Self-pay | Admitting: Vascular Surgery

## 2017-07-13 ENCOUNTER — Encounter (INDEPENDENT_AMBULATORY_CARE_PROVIDER_SITE_OTHER): Payer: Self-pay | Admitting: Vascular Surgery

## 2017-07-13 ENCOUNTER — Ambulatory Visit (INDEPENDENT_AMBULATORY_CARE_PROVIDER_SITE_OTHER): Payer: Medicare Other | Admitting: Vascular Surgery

## 2017-07-13 ENCOUNTER — Ambulatory Visit (INDEPENDENT_AMBULATORY_CARE_PROVIDER_SITE_OTHER): Payer: Medicare Other

## 2017-07-13 VITALS — BP 190/87 | HR 78 | Resp 17 | Ht 68.0 in | Wt 178.0 lb

## 2017-07-13 DIAGNOSIS — T8249XD Other complication of vascular dialysis catheter, subsequent encounter: Secondary | ICD-10-CM | POA: Diagnosis not present

## 2017-07-13 DIAGNOSIS — N186 End stage renal disease: Secondary | ICD-10-CM | POA: Diagnosis not present

## 2017-07-13 DIAGNOSIS — Z992 Dependence on renal dialysis: Secondary | ICD-10-CM | POA: Diagnosis not present

## 2017-07-13 NOTE — Progress Notes (Signed)
Subjective:    Patient ID: Johnny [name]., male    DOB: Jan 17, 1951, 67 y.o.   MRN: [id number] Chief Complaint  Patient presents with  . Follow-up    2 week HDA   The patient presents for his first post procedure follow-up.  The patient is status post a left upper extremity fistulogram for poorly functioning dialysis access.  The patient presents today without complaint.  The patient notes improvement in the function of his dialysis access since his recent intervention.  The patient underwent a left upper extremity HDA which was notable for a flow volume of 1731.  Patent left brachial axillary graft without evidence of narrowing or stenosis.  Patent stent. The patient denies any issues with hemodialysis such as cannulation problems, increased bleeding, decrease in doppler flow or recirculation. The patient also denies any fistula skin breakdown, pain, edema, pallor or ulceration of the arm / hand.   Review of Systems  Constitutional: Negative.   HENT: Negative.   Eyes: Negative.   Respiratory: Negative.   Cardiovascular: Negative.   Gastrointestinal: Negative.   Endocrine: Negative.   Genitourinary:       ESRD  Musculoskeletal: Negative.   Skin: Negative.   Allergic/Immunologic: Negative.   Neurological: Negative.   Hematological: Negative.   Psychiatric/Behavioral: Negative.       Objective:   Physical Exam  Constitutional: He is oriented to person, place, and time. He appears well-developed and well-nourished. No distress.  HENT:  Head: Normocephalic and atraumatic.  Eyes: Conjunctivae are normal. Pupils are equal, round, and reactive to light.  Neck: Normal range of motion.  Cardiovascular: Normal rate, regular rhythm, normal heart sounds and intact distal pulses.  Pulses:      Radial pulses are 2+ on the right side, and 2+ on the left side.  Left upper extremity dialysis access: Good bruit and thrill noted on exam.  Skin is intact.  Pulmonary/Chest: Effort normal and  breath sounds normal.  Musculoskeletal: Normal range of motion. He exhibits no edema.  Neurological: He is alert and oriented to person, place, and time.  Skin: Skin is warm and dry. He is not diaphoretic.  Psychiatric: He has a normal mood and affect. His behavior is normal. Judgment and thought content normal.  Vitals reviewed.  BP (!) 190/87 (BP Location: Right Arm, Patient Position: Sitting)   Pulse 78   Resp 17   Ht 5\' 8"  (1.727 m)   Wt 178 lb (80.7 kg)   BMI 27.06 kg/m   Past Medical History:  Diagnosis Date  . Anemia   . CHF (congestive heart failure) ([place])   . Chronic kidney disease   . Gout   . Hyperlipidemia   . Hypertension    Social History   Socioeconomic History  . Marital status: Single    Spouse name: Not on file  . Number of children: Not on file  . Years of education: Not on file  . Highest education level: Not on file  Social Needs  . Financial resource strain: Not on file  . Food insecurity - worry: Not on file  . Food insecurity - inability: Not on file  . Transportation needs - medical: Not on file  . Transportation needs - non-medical: Not on file  Occupational History  . Not on file  Tobacco Use  . Smoking status: Former Research scientist (life sciences)  . Smokeless tobacco: Never Used  Substance and Sexual Activity  . Alcohol use: No  . Drug use: No  . Sexual activity:  Not on file  Other Topics Concern  . Not on file  Social History Narrative  . Not on file   Past Surgical History:  Procedure Laterality Date  . A/V SHUNTOGRAM Left 06/21/2017   Procedure: A/V SHUNTOGRAM;  Surgeon: Katha Cabal, MD;  Location: [facility];  Service: Cardiovascular;  Laterality: Left;  . AV FISTULA PLACEMENT Left 09/18/2015   Procedure: INSERTION OF ARTERIOVENOUS (AV) GORE-TEX GRAFT ARM ( BRACH/AXILLARY GRAFT W/ INSTANT STICK GRAFT );  Surgeon: Katha Cabal, MD;  Location: [facility];  Service: Vascular;  Laterality: Left;  . DIALYSIS FISTULA CREATION    .  PERIPHERAL VASCULAR CATHETERIZATION Left 09/01/2015   Procedure: A/V Shuntogram/Fistulagram;  Surgeon: Katha Cabal, MD;  Location: [facility] [facility];  Service: Cardiovascular;  Laterality: Left;  . PERIPHERAL VASCULAR CATHETERIZATION N/A 09/30/2015   Procedure: A/V Shuntogram/Fistulagram with perm cathether removal;  Surgeon: Algernon Huxley, MD;  Location: [facility];  Service: Cardiovascular;  Laterality: N/A;  . PERIPHERAL VASCULAR CATHETERIZATION Left 09/30/2015   Procedure: A/V Shunt Intervention;  Surgeon: Algernon Huxley, MD;  Location: [place] [facility];  Service: Cardiovascular;  Laterality: Left;  . PERIPHERAL VASCULAR CATHETERIZATION Left 12/03/2015   Procedure: Thrombectomy;  Surgeon: Algernon Huxley, MD;  Location: [facility];  Service: Cardiovascular;  Laterality: Left;  . PERIPHERAL VASCULAR CATHETERIZATION Left 01/28/2016   Procedure: Thrombectomy;  Surgeon: Algernon Huxley, MD;  Location: [place] [facility];  Service: Cardiovascular;  Laterality: Left;  . PERIPHERAL VASCULAR CATHETERIZATION N/A 01/28/2016   Procedure: A/V Shuntogram/Fistulagram;  Surgeon: Algernon Huxley, MD;  Location: [place] [facility];  Service: Cardiovascular;  Laterality: N/A;   Family History  Problem Relation Age of Onset  . Hypertension Unknown   . Heart disease Unknown    Allergies  Allergen Reactions  . Shellfish Allergy Anaphylaxis      Assessment & Plan:  The patient presents for his first post procedure follow-up.  The patient is status post a left upper extremity fistulogram for poorly functioning dialysis access.  The patient presents today without complaint.  The patient notes improvement in the function of his dialysis access since his recent intervention.  The patient underwent a left upper extremity HDA which was notable for a flow volume of 1731.  Patent left brachial axillary graft without evidence of narrowing or stenosis.  Patent stent. The patient denies any issues with  hemodialysis such as cannulation problems, increased bleeding, decrease in doppler flow or recirculation. The patient also denies any fistula skin breakdown, pain, edema, pallor or ulceration of the arm / hand.  1. ESRD on dialysis ([place]) - Stable Studies reviewed with patient. The patient is doing well and currently has adequate dialysis access. Duplex ultrasound of the AV access shows a patent access with no evidence of hemodynamically significant strictures or stenosis.  The patient should continue to have duplex ultrasounds of the dialysis access every three to four months. The patient was instructed to call the office in the interim if any issues with dialysis access / doppler flow, pain, edema, pallor, fistula skin breakdown or ulceration of the arm / hand occur. The patient expressed their understanding.  - VAS US DUPLEX DIALYSIS ACCESS (AVF,AVG); Future  2. Complications, mechanical, catheter, dialysis, subsequent encounter ([place]) - Stable As above  Current Outpatient Medications on File Prior to Visit  Medication Sig Dispense Refill  . allopurinol (ZYLOPRIM) 100 MG tablet Take 100 mg by mouth daily.    [name]  amLODipine (NORVASC) 10 MG tablet Take 10 mg by mouth.    [name] aspirin EC 81 MG tablet Take 1 tablet (81 mg total) by mouth daily. 150 tablet 2  . B-Complex-C-Biotin-Fe & FA (DIALYVITE 800/IRON PO) Take by mouth.    . calcium acetate (PHOSLO) 667 MG capsule Take 1,334 mg by mouth 3 (three) times daily with meals.    . cinacalcet (SENSIPAR) 90 MG tablet Take 90 mg by mouth daily.     . clonazePAM (KLONOPIN) 0.5 MG tablet Take 0.5 mg by mouth at bedtime.    . clopidogrel (PLAVIX) 75 MG tablet Take 1 tablet (75 mg total) by mouth daily. 30 tablet 11  . colchicine 0.6 MG tablet Take 0.6 mg by mouth daily as needed (for gout flares).     . furosemide (LASIX) 40 MG tablet Take 40 mg by mouth See admin instructions. Take 40 mg once daily on non dialysis days Tues, Fri, Sat, and Sun    .  gabapentin (NEURONTIN) 100 MG capsule Take 200 mg by mouth 2 (two) times daily.    . hydrALAZINE (APRESOLINE) 25 MG tablet Take 25 mg by mouth daily.     [name] ibuprofen (ADVIL,MOTRIN) 600 MG tablet Take 600 mg by mouth.    . irbesartan (AVAPRO) 150 MG tablet     . lidocaine-prilocaine (EMLA) cream Apply 1 application topically as needed (port access).    . metoprolol succinate (TOPROL-XL) 50 MG 24 hr tablet Take by mouth.    . ramipril (ALTACE) 10 MG capsule Take 10 mg by mouth at bedtime.     . sevelamer carbonate (RENVELA) 800 MG tablet Take 2,400 mg by mouth 3 (three) times daily with meals.     . Calcium Gluconate 50 MG TABS Take 50 mg by mouth.     No current facility-administered medications on file prior to visit.    There are no Patient Instructions on file for this visit. No Follow-up on file.  Lanijah Warzecha A Mildred Bollard, PA-C

## 2017-07-27 ENCOUNTER — Ambulatory Visit (INDEPENDENT_AMBULATORY_CARE_PROVIDER_SITE_OTHER): Payer: Medicare Other | Admitting: Vascular Surgery

## 2017-07-27 ENCOUNTER — Encounter (INDEPENDENT_AMBULATORY_CARE_PROVIDER_SITE_OTHER): Payer: Medicare Other

## 2017-10-07 HISTORY — PX: DIALYSIS FISTULA CREATION: SHX611

## 2017-10-14 ENCOUNTER — Emergency Department: Payer: Medicare Other

## 2017-10-14 ENCOUNTER — Emergency Department
Admission: EM | Admit: 2017-10-14 | Discharge: 2017-10-15 | Disposition: A | Discharge status: Home / Self Care | Payer: Medicare Other | Attending: Emergency Medicine | Admitting: Emergency Medicine

## 2017-10-14 ENCOUNTER — Encounter: Payer: Self-pay | Admitting: Emergency Medicine

## 2017-10-14 DIAGNOSIS — Z87891 Personal history of nicotine dependence: Secondary | ICD-10-CM | POA: Diagnosis not present

## 2017-10-14 DIAGNOSIS — Z992 Dependence on renal dialysis: Secondary | ICD-10-CM | POA: Diagnosis not present

## 2017-10-14 DIAGNOSIS — N186 End stage renal disease: Secondary | ICD-10-CM | POA: Diagnosis not present

## 2017-10-14 DIAGNOSIS — L03116 Cellulitis of left lower limb: Secondary | ICD-10-CM | POA: Diagnosis not present

## 2017-10-14 DIAGNOSIS — Z7982 Long term (current) use of aspirin: Secondary | ICD-10-CM | POA: Diagnosis not present

## 2017-10-14 DIAGNOSIS — M79672 Pain in left foot: Secondary | ICD-10-CM

## 2017-10-14 DIAGNOSIS — Z79899 Other long term (current) drug therapy: Secondary | ICD-10-CM | POA: Insufficient documentation

## 2017-10-14 DIAGNOSIS — Z7902 Long term (current) use of antithrombotics/antiplatelets: Secondary | ICD-10-CM | POA: Diagnosis not present

## 2017-10-14 DIAGNOSIS — I5032 Chronic diastolic (congestive) heart failure: Secondary | ICD-10-CM | POA: Diagnosis not present

## 2017-10-14 DIAGNOSIS — I132 Hypertensive heart and chronic kidney disease with heart failure and with stage 5 chronic kidney disease, or end stage renal disease: Secondary | ICD-10-CM | POA: Diagnosis not present

## 2017-10-14 NOTE — ED Notes (Signed)
Patient to ED for foot pain x 1 week. States it is swollen and "feels like something is in there like it's moving around."

## 2017-10-14 NOTE — ED Triage Notes (Signed)
Patient with complaint of left foot pain times one week. Patient reports that the pain has become worse. Patient does not recall any injury.

## 2017-10-14 NOTE — ED Notes (Signed)
Outer aspect of left foot has some numbness and sharp pain intermittently x 1 week. Worse when walk.

## 2017-10-15 MED ORDER — HYDROCODONE-ACETAMINOPHEN 5-325 MG PO TABS
ORAL_TABLET | ORAL | Status: AC
  Filled 2017-10-15: qty 1

## 2017-10-15 MED ORDER — HYDROCODONE-ACETAMINOPHEN 5-325 MG PO TABS: 1.0000 | ORAL_TABLET | ORAL | 0 refills | Status: DC | PRN

## 2017-10-15 MED ORDER — CEPHALEXIN 500 MG PO CAPS
500.0000 mg | ORAL_CAPSULE | Freq: Every day | ORAL | 0 refills | Status: AC
Start: 2017-10-15 — End: 2017-10-22

## 2017-10-15 MED ORDER — CEPHALEXIN 500 MG PO CAPS
500.0000 mg | ORAL_CAPSULE | Freq: Once | ORAL | Status: AC
  Administered 2017-10-15: 500 mg via ORAL

## 2017-10-15 MED ORDER — CEPHALEXIN 500 MG PO CAPS
ORAL_CAPSULE | ORAL | Status: AC
  Filled 2017-10-15: qty 1

## 2017-10-15 MED ORDER — HYDROCODONE-ACETAMINOPHEN 5-325 MG PO TABS
1.0000 | ORAL_TABLET | Freq: Once | ORAL | Status: AC
  Administered 2017-10-15: 1 via ORAL

## 2017-10-15 NOTE — Discharge Instructions (Signed)
Please take your antibiotic as prescribed and pain medication as written, as needed.  Return to the emergency department for any fever or worsening pain.  Otherwise please follow-up with your primary care doctor as well as podiatrist.

## 2017-10-15 NOTE — ED Provider Notes (Signed)
[facility] Emergency Department Provider Note  Time seen: 12:14 AM  I have reviewed the triage vital signs and the nursing notes.   HISTORY  Chief Complaint Foot Pain    HPI Johnny [name]. is a 67 y.o. male with a past medical history of anemia, CHF, end-stage renal disease on hemodialysis Monday, Wednesday, Friday, hypertension, hyperlipidemia presents to the emergency department for left foot pain.  According to the patient for the past week or so he has been experiencing left foot pain but the pain has become worse over the past 2 to 3 days.  Patient denies any fever, nausea or vomiting.  Patient states her small lesion to his little toe which only showed up over the past few days.  Describes the pain as moderate aching pain.   Past Medical History:  Diagnosis Date  . Anemia   . CHF (congestive heart failure) ([place])   . Chronic kidney disease   . Gout   . Hyperlipidemia   . Hypertension     Patient Active Problem List   Diagnosis Date Noted  . Elevated troponin 10/02/2015  . Complications, dialysis, catheter, mechanical ([place]) 10/02/2015  . Musculoskeletal chest pain 09/28/2015  . ESRD on dialysis ([place]) 09/28/2015  . HTN (hypertension) 09/28/2015  . Chronic diastolic CHF (congestive heart failure) ([place]) 09/28/2015  . Gout 09/28/2015    Past Surgical History:  Procedure Laterality Date  . A/V SHUNTOGRAM Left 06/21/2017   Procedure: A/V SHUNTOGRAM;  Surgeon: Katha Cabal, MD;  Location: [place] [facility];  Service: Cardiovascular;  Laterality: Left;  . AV FISTULA PLACEMENT Left 09/18/2015   Procedure: INSERTION OF ARTERIOVENOUS (AV) GORE-TEX GRAFT ARM ( BRACH/AXILLARY GRAFT W/ INSTANT STICK GRAFT );  Surgeon: Katha Cabal, MD;  Location: [facility];  Service: Vascular;  Laterality: Left;  . DIALYSIS FISTULA CREATION    . PERIPHERAL VASCULAR CATHETERIZATION Left 09/01/2015   Procedure: A/V Shuntogram/Fistulagram;  Surgeon: Katha Cabal, MD;  Location: [place] [facility];  Service: Cardiovascular;  Laterality: Left;  . PERIPHERAL VASCULAR CATHETERIZATION N/A 09/30/2015   Procedure: A/V Shuntogram/Fistulagram with perm cathether removal;  Surgeon: Algernon Huxley, MD;  Location: [place] [facility];  Service: Cardiovascular;  Laterality: N/A;  . PERIPHERAL VASCULAR CATHETERIZATION Left 09/30/2015   Procedure: A/V Shunt Intervention;  Surgeon: Algernon Huxley, MD;  Location: [place] [facility];  Service: Cardiovascular;  Laterality: Left;  . PERIPHERAL VASCULAR CATHETERIZATION Left 12/03/2015   Procedure: Thrombectomy;  Surgeon: Algernon Huxley, MD;  Location: [place] [facility];  Service: Cardiovascular;  Laterality: Left;  . PERIPHERAL VASCULAR CATHETERIZATION Left 01/28/2016   Procedure: Thrombectomy;  Surgeon: Algernon Huxley, MD;  Location: [facility];  Service: Cardiovascular;  Laterality: Left;  . PERIPHERAL VASCULAR CATHETERIZATION N/A 01/28/2016   Procedure: A/V Shuntogram/Fistulagram;  Surgeon: Algernon Huxley, MD;  Location: [place] [facility];  Service: Cardiovascular;  Laterality: N/A;    Prior to Admission medications   Medication Sig Start Date End Date Taking? Authorizing Provider  allopurinol (ZYLOPRIM) 100 MG tablet Take 100 mg by mouth daily.    [provider]  amLODipine (NORVASC) 10 MG tablet Take 10 mg by mouth.    [provider]  aspirin EC 81 MG tablet Take 1 tablet (81 mg total) by mouth daily. 12/03/15   Algernon Huxley, MD  B-Complex-C-Biotin-Fe & FA (DIALYVITE 800/IRON PO) Take by mouth.    [provider]  calcium acetate (PHOSLO) [phone]  MG capsule Take 1,334 mg by mouth 3 (three) times daily with meals.    [provider]  Calcium Gluconate 50 MG TABS Take 50 mg by mouth. 05/19/11   [provider]  cinacalcet (SENSIPAR) 90 MG tablet Take 90 mg by mouth daily.     [provider]  clonazePAM (KLONOPIN) 0.5 MG tablet Take 0.5 mg by mouth at bedtime.     [provider]  clopidogrel (PLAVIX) 75 MG tablet Take 1 tablet (75 mg total) by mouth daily. 12/03/15   Algernon Huxley, MD  colchicine 0.6 MG tablet Take 0.6 mg by mouth daily as needed (for gout flares).     [provider]  furosemide (LASIX) 40 MG tablet Take 40 mg by mouth See admin instructions. Take 40 mg once daily on non dialysis days Tues, Fri, Sat, and Sun    [provider]  gabapentin (NEURONTIN) 100 MG capsule Take 200 mg by mouth 2 (two) times daily.    [provider]  hydrALAZINE (APRESOLINE) 25 MG tablet Take 25 mg by mouth daily.     [provider]  ibuprofen (ADVIL,MOTRIN) 600 MG tablet Take 600 mg by mouth. 02/28/13   [provider]  irbesartan (AVAPRO) 150 MG tablet  05/31/17   [provider]  lidocaine-prilocaine (EMLA) cream Apply 1 application topically as needed (port access).    [provider]  metoprolol succinate (TOPROL-XL) 50 MG 24 hr tablet Take by mouth.    [provider]  ramipril (ALTACE) 10 MG capsule Take 10 mg by mouth at bedtime.     [provider]  sevelamer carbonate (RENVELA) 800 MG tablet Take 2,400 mg by mouth 3 (three) times daily with meals.     [provider]    Allergies  Allergen Reactions  . Shellfish Allergy Anaphylaxis    Family History  Problem Relation Age of Onset  . Hypertension Unknown   . Heart disease Unknown     Social History Social History   Tobacco Use  . Smoking status: Former Research scientist (life sciences)  . Smokeless tobacco: Never Used  Substance Use Topics  . Alcohol use: No  . Drug use: No    Review of Systems Constitutional: Negative for fever. Cardiovascular: Negative for chest pain. Respiratory: Negative for shortness of breath. Gastrointestinal: Negative for abdominal pain, vomiting Musculoskeletal: Left foot pain Skin: Small lesion to left little toe. Neurological: Negative for headache All other ROS  negative  ____________________________________________   PHYSICAL EXAM:  VITAL SIGNS: ED Triage Vitals  Enc Vitals Group     BP 10/14/17 2025 (!) 248/93     Pulse Rate 10/14/17 2023 88     Resp 10/14/17 2023 18     Temp 10/14/17 2023 98.4 F (36.9 C)     Temp Source 10/14/17 2023 Oral     SpO2 10/14/17 2023 100 %     Weight 10/14/17 2024 175 lb (79.4 kg)     Height 10/14/17 2024 5\' 8"  (1.727 m)     Head Circumference --      Peak Flow --      Pain Score 10/14/17 2024 10     Pain Loc --      Pain Edu? --      Excl. in [place]? --    Constitutional: Alert and oriented. Well appearing and in no distress. Eyes: Normal exam ENT   Head: Normocephalic and atraumatic.   Mouth/Throat: Mucous membranes are moist. Cardiovascular: Normal rate, regular rhythm.  No murmur Respiratory: Normal respiratory effort without tachypnea nor retractions. Breath sounds are clear  Gastrointestinal: Soft and nontender. No distention.  Musculoskeletal: Patient has a lesion to his left little toe that looks consistent with a mild ulceration.  Has tenderness along the lateral aspect of the left foot where there is also a small lesion more consistent with a skin plaque.  No edema noted.  No sniffing erythema noted. Neurologic:  Normal speech and language. No gross focal neurologic deficits Skin:  Skin is warm, dry and intact.  Psychiatric: Mood and affect are normal.   ____________________________________________   RADIOLOGY  X-ray negative  ____________________________________________   INITIAL IMPRESSION / ASSESSMENT AND PLAN / ED COURSE  Pertinent labs & imaging results that were available during my care of the patient were reviewed by me and considered in my medical decision making (see chart for details).  Patient presents emergency department for left foot pain ongoing for the past 1 to 2 weeks but worse over the past 2 to 3 days.  On examination patient does have a small ulceration to  his left little toe which he states just happened within the past several days.  He also has a small plaque to his left lateral foot that is tender to palpation.  Patient has an appointment with podiatry on the 20th.  Given the likely mild infection we will place the patient on antibiotics as well as pain medication.  We will have the patient follow-up with his primary care doctor this week and with podiatry next week as scheduled.  Overall the patient appears well.  Afebrile with no red flags on examination.  ____________________________________________   FINAL CLINICAL IMPRESSION(S) / ED DIAGNOSES  Cellulitis Ulceration    Harvest Dark, MD 10/15/17 0030

## 2017-11-07 ENCOUNTER — Ambulatory Visit (INDEPENDENT_AMBULATORY_CARE_PROVIDER_SITE_OTHER): Payer: Medicare Other | Admitting: Vascular Surgery

## 2017-11-07 ENCOUNTER — Encounter (INDEPENDENT_AMBULATORY_CARE_PROVIDER_SITE_OTHER): Payer: Self-pay | Admitting: Vascular Surgery

## 2017-11-07 ENCOUNTER — Encounter (INDEPENDENT_AMBULATORY_CARE_PROVIDER_SITE_OTHER): Payer: Self-pay

## 2017-11-07 VITALS — BP 238/95 | HR 85 | Resp 16 | Ht 68.0 in | Wt 171.8 lb

## 2017-11-07 DIAGNOSIS — Z992 Dependence on renal dialysis: Secondary | ICD-10-CM

## 2017-11-07 DIAGNOSIS — N186 End stage renal disease: Secondary | ICD-10-CM | POA: Diagnosis not present

## 2017-11-07 DIAGNOSIS — I70212 Atherosclerosis of native arteries of extremities with intermittent claudication, left leg: Secondary | ICD-10-CM | POA: Diagnosis not present

## 2017-11-07 DIAGNOSIS — I7025 Atherosclerosis of native arteries of other extremities with ulceration: Secondary | ICD-10-CM | POA: Insufficient documentation

## 2017-11-07 DIAGNOSIS — I10 Essential (primary) hypertension: Secondary | ICD-10-CM

## 2017-11-07 NOTE — Assessment & Plan Note (Signed)
Access working well.  Increases the risk of wound healing issues.

## 2017-11-07 NOTE — Patient Instructions (Signed)

## 2017-11-07 NOTE — Assessment & Plan Note (Signed)
blood pressure control important in reducing the progression of atherosclerotic disease. On appropriate oral medications.  

## 2017-11-07 NOTE — Progress Notes (Signed)
MRN : [id number]  Johnny [name]. is a 67 y.o. (22-Sep-1950) male who presents with chief complaint of  Chief Complaint  Patient presents with  . Follow-up    ref Burns Left foot ulcer  .  History of Present Illness: Patient returns today in follow up.  He is well-known to [name] for his dialysis access needs.  Recently, he has developed a nonhealing ulceration on the left lateral foot just in front of the heel.  No trauma or injury or cause of the wound.  The patient denies any fevers or chills.  The wound has been poorly healing.  He describes symptoms consistent with rest pain in the left foot having to dangle his foot at night.  He has a long-standing history of renal failure and other atherosclerotic risk factors. No right leg symptoms.  Does have claudication as well in the left leg. No fever or chills.  Current Outpatient Medications  Medication Sig Dispense Refill  . allopurinol (ZYLOPRIM) 100 MG tablet Take 100 mg by mouth daily.    [name] amLODipine (NORVASC) 10 MG tablet Take 10 mg by mouth.    [name] aspirin EC 81 MG tablet Take 1 tablet (81 mg total) by mouth daily. 150 tablet 2  . B-Complex-C-Biotin-Fe & FA (DIALYVITE 800/IRON PO) Take by mouth.    . calcium acetate (PHOSLO) 667 MG capsule Take 1,334 mg by mouth 3 (three) times daily with meals.    . Calcium Gluconate 50 MG TABS Take 50 mg by mouth.    . cinacalcet (SENSIPAR) 90 MG tablet Take 90 mg by mouth daily.     . clonazePAM (KLONOPIN) 0.5 MG tablet Take 0.5 mg by mouth at bedtime.    . clopidogrel (PLAVIX) 75 MG tablet Take 1 tablet (75 mg total) by mouth daily. 30 tablet 11  . colchicine 0.6 MG tablet Take 0.6 mg by mouth daily as needed (for gout flares).     . furosemide (LASIX) 40 MG tablet Take 40 mg by mouth See admin instructions. Take 40 mg once daily on non dialysis days Tues, Fri, Sat, and Sun    . gabapentin (NEURONTIN) 100 MG capsule Take 200 mg by mouth 2 (two) times daily.    . hydrALAZINE (APRESOLINE) 25 MG tablet  Take 25 mg by mouth daily.     [name] HYDROcodone-acetaminophen (NORCO/VICODIN) 5-325 MG tablet Take 1 tablet by mouth every 4 (four) hours as needed. 10 tablet 0  . ibuprofen (ADVIL,MOTRIN) 600 MG tablet Take 600 mg by mouth.    . irbesartan (AVAPRO) 150 MG tablet     . lidocaine-prilocaine (EMLA) cream Apply 1 application topically as needed (port access).    . metoprolol succinate (TOPROL-XL) 50 MG 24 hr tablet Take by mouth.    . ramipril (ALTACE) 10 MG capsule Take 10 mg by mouth at bedtime.     . sevelamer carbonate (RENVELA) 800 MG tablet Take 2,400 mg by mouth 3 (three) times daily with meals.      No current facility-administered medications for this visit.     Past Medical History:  Diagnosis Date  . Anemia   . CHF (congestive heart failure) ([place])   . Chronic kidney disease   . Gout   . Hyperlipidemia   . Hypertension     Past Surgical History:  Procedure Laterality Date  . A/V SHUNTOGRAM Left 06/21/2017   Procedure: A/V SHUNTOGRAM;  Surgeon: Katha Cabal, MD;  Location: [facility];  Service: Cardiovascular;  Laterality: Left;  . AV FISTULA PLACEMENT Left 09/18/2015   Procedure: INSERTION OF ARTERIOVENOUS (AV) GORE-TEX GRAFT ARM ( BRACH/AXILLARY GRAFT W/ INSTANT STICK GRAFT );  Surgeon: Katha Cabal, MD;  Location: [facility];  Service: Vascular;  Laterality: Left;  . DIALYSIS FISTULA CREATION    . PERIPHERAL VASCULAR CATHETERIZATION Left 09/01/2015   Procedure: A/V Shuntogram/Fistulagram;  Surgeon: Katha Cabal, MD;  Location: [facility];  Service: Cardiovascular;  Laterality: Left;  . PERIPHERAL VASCULAR CATHETERIZATION N/A 09/30/2015   Procedure: A/V Shuntogram/Fistulagram with perm cathether removal;  Surgeon: Algernon Huxley, MD;  Location: [facility];  Service: Cardiovascular;  Laterality: N/A;  . PERIPHERAL VASCULAR CATHETERIZATION Left 09/30/2015   Procedure: A/V Shunt Intervention;  Surgeon: Algernon Huxley, MD;  Location: [facility];  Service: Cardiovascular;  Laterality: Left;  . PERIPHERAL VASCULAR CATHETERIZATION Left 12/03/2015   Procedure: Thrombectomy;  Surgeon: Algernon Huxley, MD;  Location: [facility];  Service: Cardiovascular;  Laterality: Left;  . PERIPHERAL VASCULAR CATHETERIZATION Left 01/28/2016   Procedure: Thrombectomy;  Surgeon: Algernon Huxley, MD;  Location: [facility];  Service: Cardiovascular;  Laterality: Left;  . PERIPHERAL VASCULAR CATHETERIZATION N/A 01/28/2016   Procedure: A/V Shuntogram/Fistulagram;  Surgeon: Algernon Huxley, MD;  Location: [facility];  Service: Cardiovascular;  Laterality: N/A;    Social History Social History   Tobacco Use  . Smoking status: Former Research scientist (life sciences)  . Smokeless tobacco: Never Used  Substance Use Topics  . Alcohol use: No  . Drug use: No     Family History Family History  Problem Relation Age of Onset  . Hypertension Unknown   . Heart disease Unknown   no bleeding disorders, clotting disorders, aneurysms, or autoimmune diseases  Allergies  Allergen Reactions  . Shellfish Allergy Anaphylaxis     REVIEW OF SYSTEMS (Negative unless checked)  Constitutional: [] Weight loss  [] Fever  [] Chills Cardiac: [] Chest pain   [] Chest pressure   [] Palpitations   [] Shortness of breath when laying flat   [] Shortness of breath at rest   [] Shortness of breath with exertion. Vascular:  [x] Pain in legs with walking   [x] Pain in legs at rest   [] Pain in legs when laying flat   [] Claudication   [] Pain in feet when walking  [] Pain in feet at rest  [] Pain in feet when laying flat   [] History of DVT   [] Phlebitis   [] Swelling in legs   [] Varicose veins   [x] Non-healing ulcers Pulmonary:   [] Uses home oxygen   [] Productive cough   [] Hemoptysis   [] Wheeze  [] COPD   [] Asthma Neurologic:  [] Dizziness  [] Blackouts   [] Seizures   [] History of stroke   [] History of TIA  [] Aphasia   [] Temporary blindness   [] Dysphagia   [] Weakness or numbness in arms   [] Weakness or  numbness in legs Musculoskeletal:  [x] Arthritis   [] Joint swelling   [] Joint pain   [] Low back pain Hematologic:  [] Easy bruising  [] Easy bleeding   [] Hypercoagulable state   [x] Anemic   Gastrointestinal:  [] Blood in stool   [] Vomiting blood  [] Gastroesophageal reflux/heartburn   [] Abdominal pain Genitourinary:  [x] Chronic kidney disease   [] Difficult urination  [] Frequent urination  [] Burning with urination   [] Hematuria Skin:  [] Rashes   [x] Ulcers   [x] Wounds Psychological:  [] History of anxiety   []  History of major depression.     Physical Examination  BP (!) 238/95 (BP Location: Right Arm)   Pulse  85   Resp 16   Ht 5\' 8"  (1.727 m)   Wt 171 lb 12.8 oz (77.9 kg)   BMI 26.12 kg/m  Gen:  WD/WN, NAD Head: [place]/AT, No temporalis wasting. Ear/Nose/Throat: Hearing grossly intact, nares w/o erythema or drainage Eyes: Conjunctiva clear. Sclera non-icteric Neck: Supple.  Trachea midline Pulmonary:  Good air movement, no use of accessory muscles.  Cardiac: irregular Vascular: good thrill in left arm access Vessel Right Left  Radial Palpable Palpable                          PT 1+ Palpable Not Palpable  DP Not Palpable Trace Palpable   Gastrointestinal: soft, non-tender/non-distended. No guarding/reflex.  Musculoskeletal: M/S 5/5 throughout.  Roughly 1 cm circular ulceration on the lateral left foot just distal to the heel.  This is below the ankle.  This is pale without significant granulation tissue.  No redness or drainage.  Mild swelling Neurologic: Sensation grossly intact in extremities.  Symmetrical.  Speech is fluent.  Psychiatric: Judgment intact, Mood & affect appropriate for pt's clinical situation. Dermatologic: Left foot ulceration as above       Labs No results found for this or any previous visit (from the past 2160 hour(s)).  Radiology Dg Foot Complete Left  Result Date: 10/14/2017 CLINICAL DATA:  Acute pain laterally for 1 week, no known injury EXAM: LEFT  FOOT - COMPLETE 3+ VIEW COMPARISON:  None FINDINGS: Osseous demineralization. Advanced degenerative changes first MTP joint with joint space narrowing and spur formation. No acute fracture, dislocation, or bone destruction. Extensive small vessel vascular calcification question due to renal failure or diabetes mellitus. IMPRESSION: Degenerative changes first MTP joint. No acute abnormalities. Electronically Signed   By: Lavonia Dana M.D.   On: 10/14/2017 23:49    Assessment/Plan  HTN (hypertension) blood pressure control important in reducing the progression of atherosclerotic disease. On appropriate oral medications.   ESRD on dialysis [facility]) Access working well.  Increases the risk of wound healing issues.   Atherosclerosis of native arteries of extremity with intermittent claudication (HCC)  Recommend:  The patient has evidence of severe atherosclerotic changes of the left lower extremity associated with ulceration and tissue loss of the foot.  This represents a limb threatening ischemia and places the patient at the risk for limb loss.  Patient should undergo angiography of the lower extremities with the hope for intervention for limb salvage.  The risks and benefits as well as the alternative therapies was discussed in detail with the patient.  All questions were answered.  Patient agrees to proceed with angiography.  The patient will follow up with me in the office after the procedure.      Leotis Pain, MD  11/07/2017 10:30 AM    This note was created with Dragon medical transcription system.  Any errors from dictation are purely unintentional

## 2017-11-07 NOTE — Assessment & Plan Note (Signed)
Recommend:  The patient has evidence of severe atherosclerotic changes of the left lower extremity associated with ulceration and tissue loss of the foot.  This represents a limb threatening ischemia and places the patient at the risk for limb loss.  Patient should undergo angiography of the lower extremities with the hope for intervention for limb salvage.  The risks and benefits as well as the alternative therapies was discussed in detail with the patient.  All questions were answered.  Patient agrees to proceed with angiography.  The patient will follow up with me in the office after the procedure.

## 2017-11-13 ENCOUNTER — Other Ambulatory Visit (INDEPENDENT_AMBULATORY_CARE_PROVIDER_SITE_OTHER): Payer: Self-pay | Admitting: Vascular Surgery

## 2017-11-15 ENCOUNTER — Inpatient Hospital Stay: Admission: RE | Admit: 2017-11-15 | Payer: Medicare Other | Source: Ambulatory Visit

## 2017-11-16 ENCOUNTER — Encounter
Admission: RE | Disposition: A | Discharge status: Home / Self Care | Payer: Self-pay | Source: Ambulatory Visit | Attending: Vascular Surgery

## 2017-11-16 ENCOUNTER — Ambulatory Visit
Admission: RE | Admit: 2017-11-16 | Discharge: 2017-11-16 | Disposition: A | Discharge status: Home / Self Care | Payer: Medicare Other | Source: Ambulatory Visit | Attending: Vascular Surgery | Admitting: Vascular Surgery

## 2017-11-16 ENCOUNTER — Other Ambulatory Visit: Payer: Self-pay

## 2017-11-16 ENCOUNTER — Other Ambulatory Visit (INDEPENDENT_AMBULATORY_CARE_PROVIDER_SITE_OTHER): Payer: Self-pay

## 2017-11-16 DIAGNOSIS — Z992 Dependence on renal dialysis: Secondary | ICD-10-CM | POA: Insufficient documentation

## 2017-11-16 DIAGNOSIS — Z7901 Long term (current) use of anticoagulants: Secondary | ICD-10-CM | POA: Diagnosis not present

## 2017-11-16 DIAGNOSIS — I7025 Atherosclerosis of native arteries of other extremities with ulceration: Secondary | ICD-10-CM | POA: Insufficient documentation

## 2017-11-16 DIAGNOSIS — Z8249 Family history of ischemic heart disease and other diseases of the circulatory system: Secondary | ICD-10-CM | POA: Diagnosis not present

## 2017-11-16 DIAGNOSIS — Z9889 Other specified postprocedural states: Secondary | ICD-10-CM | POA: Insufficient documentation

## 2017-11-16 DIAGNOSIS — I70299 Other atherosclerosis of native arteries of extremities, unspecified extremity: Secondary | ICD-10-CM

## 2017-11-16 DIAGNOSIS — I509 Heart failure, unspecified: Secondary | ICD-10-CM | POA: Diagnosis not present

## 2017-11-16 DIAGNOSIS — Z87891 Personal history of nicotine dependence: Secondary | ICD-10-CM | POA: Diagnosis not present

## 2017-11-16 DIAGNOSIS — Z79899 Other long term (current) drug therapy: Secondary | ICD-10-CM | POA: Insufficient documentation

## 2017-11-16 DIAGNOSIS — I132 Hypertensive heart and chronic kidney disease with heart failure and with stage 5 chronic kidney disease, or end stage renal disease: Secondary | ICD-10-CM | POA: Diagnosis not present

## 2017-11-16 DIAGNOSIS — I70212 Atherosclerosis of native arteries of extremities with intermittent claudication, left leg: Secondary | ICD-10-CM

## 2017-11-16 DIAGNOSIS — Z7982 Long term (current) use of aspirin: Secondary | ICD-10-CM | POA: Insufficient documentation

## 2017-11-16 DIAGNOSIS — E785 Hyperlipidemia, unspecified: Secondary | ICD-10-CM | POA: Insufficient documentation

## 2017-11-16 DIAGNOSIS — M109 Gout, unspecified: Secondary | ICD-10-CM | POA: Diagnosis not present

## 2017-11-16 DIAGNOSIS — I70248 Atherosclerosis of native arteries of left leg with ulceration of other part of lower left leg: Secondary | ICD-10-CM | POA: Diagnosis not present

## 2017-11-16 DIAGNOSIS — L97521 Non-pressure chronic ulcer of other part of left foot limited to breakdown of skin: Secondary | ICD-10-CM | POA: Insufficient documentation

## 2017-11-16 DIAGNOSIS — N186 End stage renal disease: Secondary | ICD-10-CM | POA: Diagnosis not present

## 2017-11-16 DIAGNOSIS — Z91013 Allergy to seafood: Secondary | ICD-10-CM | POA: Insufficient documentation

## 2017-11-16 DIAGNOSIS — L97909 Non-pressure chronic ulcer of unspecified part of unspecified lower leg with unspecified severity: Secondary | ICD-10-CM

## 2017-11-16 HISTORY — PX: LOWER EXTREMITY ANGIOGRAPHY: CATH118251

## 2017-11-16 SURGERY — LOWER EXTREMITY ANGIOGRAPHY
Anesthesia: Moderate Sedation | Laterality: Left

## 2017-11-16 MED ORDER — HYDRALAZINE HCL 20 MG/ML IJ SOLN: 5.0000 mg | INTRAMUSCULAR | Status: DC | PRN

## 2017-11-16 MED ORDER — LABETALOL HCL 5 MG/ML IV SOLN: 10.0000 mg | INTRAVENOUS | Status: DC | PRN

## 2017-11-16 MED ORDER — ACETAMINOPHEN 325 MG PO TABS: 650.0000 mg | ORAL_TABLET | ORAL | Status: DC | PRN

## 2017-11-16 MED ORDER — FENTANYL CITRATE (PF) 100 MCG/2ML IJ SOLN
INTRAMUSCULAR | Status: AC
  Filled 2017-11-16: qty 2

## 2017-11-16 MED ORDER — ATORVASTATIN CALCIUM 10 MG PO TABS: 10.0000 mg | ORAL_TABLET | Freq: Every day | ORAL | Status: DC

## 2017-11-16 MED ORDER — MIDAZOLAM HCL 2 MG/2ML IJ SOLN
INTRAMUSCULAR | Status: AC
  Filled 2017-11-16: qty 2

## 2017-11-16 MED ORDER — HEPARIN SODIUM (PORCINE) 1000 UNIT/ML IJ SOLN
INTRAMUSCULAR | Status: AC
  Filled 2017-11-16: qty 1

## 2017-11-16 MED ORDER — HYDROMORPHONE HCL 1 MG/ML IJ SOLN: 1.0000 mg | Freq: Once | INTRAMUSCULAR | Status: DC | PRN

## 2017-11-16 MED ORDER — SODIUM CHLORIDE 0.9 % IV SOLN
INTRAVENOUS | Status: DC
  Administered 2017-11-16: 10:00:00 via INTRAVENOUS

## 2017-11-16 MED ORDER — HEPARIN (PORCINE) IN NACL 1000-0.9 UT/500ML-% IV SOLN
INTRAVENOUS | Status: AC
  Filled 2017-11-16: qty 1000

## 2017-11-16 MED ORDER — FAMOTIDINE 20 MG PO TABS: 40.0000 mg | ORAL_TABLET | ORAL | Status: DC | PRN

## 2017-11-16 MED ORDER — SODIUM CHLORIDE 0.9 % IV SOLN: 250.0000 mL | INTRAVENOUS | Status: DC | PRN

## 2017-11-16 MED ORDER — MIDAZOLAM HCL 2 MG/2ML IJ SOLN
INTRAMUSCULAR | Status: DC | PRN
  Administered 2017-11-16 (×3): 2 mg via INTRAVENOUS

## 2017-11-16 MED ORDER — METHYLPREDNISOLONE SODIUM SUCC 125 MG IJ SOLR
INTRAMUSCULAR | Status: AC
  Filled 2017-11-16: qty 2

## 2017-11-16 MED ORDER — ONDANSETRON HCL 4 MG/2ML IJ SOLN: 4.0000 mg | Freq: Four times a day (QID) | INTRAMUSCULAR | Status: DC | PRN

## 2017-11-16 MED ORDER — IOPAMIDOL (ISOVUE-300) INJECTION 61%
INTRAVENOUS | Status: DC | PRN
  Administered 2017-11-16: 95 mL via INTRAVENOUS

## 2017-11-16 MED ORDER — CEFAZOLIN SODIUM-DEXTROSE 2-4 GM/100ML-% IV SOLN
INTRAVENOUS | Status: AC
  Filled 2017-11-16: qty 100

## 2017-11-16 MED ORDER — ATORVASTATIN CALCIUM 10 MG PO TABS: 10.0000 mg | ORAL_TABLET | Freq: Every day | ORAL | 11 refills | Status: DC

## 2017-11-16 MED ORDER — DIPHENHYDRAMINE HCL 50 MG/ML IJ SOLN
INTRAMUSCULAR | Status: AC
  Filled 2017-11-16: qty 1

## 2017-11-16 MED ORDER — CEFAZOLIN SODIUM-DEXTROSE 2-4 GM/100ML-% IV SOLN
2.0000 g | Freq: Once | INTRAVENOUS | Status: AC
  Administered 2017-11-16: 2 g via INTRAVENOUS

## 2017-11-16 MED ORDER — DIPHENHYDRAMINE HCL 50 MG/ML IJ SOLN
25.0000 mg | Freq: Once | INTRAMUSCULAR | Status: AC
  Administered 2017-11-16: 25 mg via INTRAVENOUS

## 2017-11-16 MED ORDER — METHYLPREDNISOLONE SODIUM SUCC 125 MG IJ SOLR: 125.0000 mg | INTRAMUSCULAR | Status: DC | PRN

## 2017-11-16 MED ORDER — MIDAZOLAM HCL 2 MG/2ML IJ SOLN
INTRAMUSCULAR | Status: AC
  Filled 2017-11-16: qty 4

## 2017-11-16 MED ORDER — SODIUM CHLORIDE 0.9 % IV SOLN: INTRAVENOUS | Status: DC

## 2017-11-16 MED ORDER — SODIUM CHLORIDE 0.9% FLUSH: 3.0000 mL | INTRAVENOUS | Status: DC | PRN

## 2017-11-16 MED ORDER — SODIUM CHLORIDE 0.9% FLUSH: 3.0000 mL | Freq: Two times a day (BID) | INTRAVENOUS | Status: DC

## 2017-11-16 MED ORDER — FENTANYL CITRATE (PF) 100 MCG/2ML IJ SOLN
INTRAMUSCULAR | Status: DC | PRN
  Administered 2017-11-16 (×4): 50 ug via INTRAVENOUS

## 2017-11-16 MED ORDER — SODIUM CHLORIDE FLUSH 0.9 % IV SOLN
INTRAVENOUS | Status: AC
  Filled 2017-11-16: qty 10

## 2017-11-16 MED ORDER — LIDOCAINE-EPINEPHRINE (PF) 1 %-1:200000 IJ SOLN
INTRAMUSCULAR | Status: AC
  Filled 2017-11-16: qty 30

## 2017-11-16 MED ORDER — LABETALOL HCL 5 MG/ML IV SOLN
INTRAVENOUS | Status: DC | PRN
  Administered 2017-11-16 (×3): 10 mg via INTRAVENOUS

## 2017-11-16 MED ORDER — METHYLPREDNISOLONE SODIUM SUCC 125 MG IJ SOLR
125.0000 mg | Freq: Once | INTRAMUSCULAR | Status: AC
  Administered 2017-11-16: 125 mg via INTRAVENOUS

## 2017-11-16 MED ORDER — HEPARIN SODIUM (PORCINE) 1000 UNIT/ML IJ SOLN
INTRAMUSCULAR | Status: DC | PRN
  Administered 2017-11-16: 5000 [IU] via INTRAVENOUS

## 2017-11-16 MED ORDER — LABETALOL HCL 5 MG/ML IV SOLN
INTRAVENOUS | Status: AC
  Filled 2017-11-16: qty 4

## 2017-11-16 SURGICAL SUPPLY — 32 items
BALLN DORADO 5X200X135 (BALLOONS) ×2
BALLN LUTONIX 018 4X100X130 (BALLOONS) ×2
BALLN LUTONIX 018 5X150X130 (BALLOONS) ×2
BALLN LUTONIX 018 5X220X130 (BALLOONS) ×2
BALLN LUTONIX DCB 6X80X130 (BALLOONS) ×2
BALLN ULTRVRSE 2.5X300X150 (BALLOONS) ×2
BALLN ULTRVRSE 2X300X150 (BALLOONS) ×1
BALLN ULTRVRSE 2X300X150 OTW (BALLOONS) ×1
BALLOON DORADO 5X200X135 (BALLOONS) ×1 IMPLANT
BALLOON LUTONIX 018 4X100X130 (BALLOONS) ×1 IMPLANT
BALLOON LUTONIX 018 5X150X130 (BALLOONS) ×1 IMPLANT
BALLOON LUTONIX 018 5X220X130 (BALLOONS) ×1 IMPLANT
BALLOON LUTONIX DCB 6X80X130 (BALLOONS) ×1 IMPLANT
BALLOON ULTRVRSE 2.5X300X150 (BALLOONS) ×1 IMPLANT
BALLOON ULTRVRSE 2X300X150 OTW (BALLOONS) ×1 IMPLANT
CATH BEACON 5 .038 100 VERT TP (CATHETERS) ×2 IMPLANT
CATH CXI SUPP ANG 2.6FR 150CM (CATHETERS) ×2 IMPLANT
CATH CXI SUPP ANG 4FR 135 (CATHETERS) ×1 IMPLANT
CATH CXI SUPP ANG 4FR 135CM (CATHETERS) ×2
CATH PIG 70CM (CATHETERS) ×2 IMPLANT
DEVICE PRESTO INFLATION (MISCELLANEOUS) ×2 IMPLANT
DEVICE STARCLOSE SE CLOSURE (Vascular Products) ×2 IMPLANT
GLIDEWIRE ADV .035X260CM (WIRE) ×2 IMPLANT
GUIDEWIRE PFTE-COATED .018X300 (WIRE) ×2 IMPLANT
PACK ANGIOGRAPHY (CUSTOM PROCEDURE TRAY) ×2 IMPLANT
SHEATH ANL2 6FRX45 HC (SHEATH) ×2 IMPLANT
SHEATH BRITE TIP 5FRX11 (SHEATH) ×2 IMPLANT
STENT VIABAHN 6X250X120 (Permanent Stent) ×2 IMPLANT
SYR MEDRAD MARK V 150ML (SYRINGE) ×2 IMPLANT
TUBING CONTRAST HIGH PRESS 72 (TUBING) ×2 IMPLANT
WIRE G V18X300CM (WIRE) ×4 IMPLANT
WIRE J 3MM .035X145CM (WIRE) ×2 IMPLANT

## 2017-11-16 NOTE — Op Note (Signed)
[facility]  Percutaneous Study/Intervention Procedural Note   Date of Surgery: 11/16/2017  Surgeon(s):DEW,JASON    Assistants:none  Pre-operative Diagnosis: PAD with ulceration left lower extremity  Post-operative diagnosis:  Same  Procedure(s) Performed:             1.  Ultrasound guidance for vascular access right femoral artery             2.  Catheter placement into left anterior tibial artery and left posterior tibial artery from right femoral approach             3.  Aortogram and selective left lower extremity angiogram             4.  Percutaneous transluminal angioplasty of left anterior tibial artery with 2 mm diameter by 30 cm length angioplasty balloon             5.   Percutaneous transluminal angioplasty of the left popliteal artery and SFA with 4 mm diameter by 10 cm length Lutonix drug-coated angioplasty balloon a 5 mm diameter by 22 cm length and a 5 mm diameter by 15 cm length Lutonix drug-coated angioplasty balloon  6.  Percutaneous transluminal angioplasty of the left common femoral artery with a 6 mm diameter by 8 cm length Lutonix drug-coated angioplasty balloon  7.  Percutaneous transluminal angioplasty of the left tibioperoneal trunk and posterior tibial artery with 2 inflations with a 2.5 mm diameter by 30 cm length angioplasty balloon  8.  Viabahn stent placement to the left distal SFA and popliteal artery for greater than 50% residual stenosis after angioplasty using a 6 mm diameter by 25 cm length stent             9.  StarClose closure device right femoral artery  EBL: 10 cc  Contrast: 95 cc  Fluoro Time: 21.6 minutes  Moderate Conscious Sedation Time: approximately 75 minutes using 6 mg of Versed and 200 mcg of Fentanyl              Indications:  Patient is a 67 y.o.male with ulceration and gangrenous changes to the left foot. The patient is brought in for angiography for further evaluation and potential treatment.  Due to the  limb threatening nature of the situation, angiogram was performed for attempted limb salvage. The patient is aware that if the procedure fails, amputation would be expected.  The patient also understands that even with successful revascularization, amputation may still be required due to the severity of the situation.  Risks and benefits are discussed and informed consent is obtained.   Procedure:  The patient was identified and appropriate procedural time out was performed.  The patient was then placed supine on the table and prepped and draped in the usual sterile fashion. Moderate conscious sedation was administered during a face to face encounter with the patient throughout the procedure with my supervision of the RN administering medicines and monitoring the patient's vital signs, pulse oximetry, telemetry and mental status throughout from the start of the procedure until the patient was taken to the recovery room. Ultrasound was used to evaluate the right common femoral artery.  It was patent and highly calcific.  A digital ultrasound image was acquired.  A Seldinger needle was used to access the right common femoral artery under direct ultrasound guidance and a permanent image was performed.  A 0.035 J wire was advanced without resistance and a 5Fr sheath was placed.  Pigtail catheter was placed into  the aorta and an AP aortogram was performed. This demonstrated normal renal arteries and normal aorta and iliac segments without significant stenosis. I then crossed the aortic bifurcation and advanced to the left femoral head. Selective left lower extremity angiogram was then performed. This demonstrated a heavily calcific and diseased common femoral artery with about an 80 to 85% stenosis.  The profunda femoris artery was calcific and small.  The SFA was diffusely diseased with a high-grade stenosis about 5 cm beyond its origin, and then multiple greater than 70% lesions down to a distal SFA occlusion.  The  below-knee popliteal artery then reconstituted feet.  There were then heavily diseased anterior tibial artery with multiple areas of occlusion, a chronically occluded peroneal artery, and a left posterior tibial artery which was the best runoff but had about an 80% stenosis near its origin, another stenosis of about 70 to 75% in the mid segment, and a greater than 80% stenosis in the mid to distal segment. The patient was systemically heparinized and a 6 [place] Ansell sheath was then placed over the [employer] wire. I then used a Kumpe catheter and the advantage wire to navigate down into the SFA and across the SFA and popliteal occlusions confirming intraluminal flow in the proximal anterior tibial artery.  I then exchanged for a 0.018 wire and tried to cross the occlusions and stenosis in the anterior tibial artery.  I was able to gain access to the mid to distal anterior tibial artery but could not get a catheter to track any further.  I used a 2 mm diameter by 30 cm length angioplasty balloon from the mid to distal anterior tibial artery up through its origin inflating this to 12 atm for 1 minute.  Although the area treated was improved with less than 50% residual stenosis, there remained distal occlusion and I was never able to gain access to the distal anterior tibial artery.  Angioplasty was then performed in the SFA and popliteal lesions.  A 5 mm diameter by 22 cm length Lutonix drug-coated angioplasty balloon was used to treat much of the SFA.  This was inflated to 10 atm for 1 minute.  A 5 mm diameter by 15 cm length Lutonix drug-coated angioplasty balloon was used to treat the distal SFA and popliteal artery to the knee.  This was inflated to 8 atm for 1 minute.  A 4 mm diameter by 10 cm length Lutonix drug-coated angioplasty balloon was inflated to 12 atm in the below-knee popliteal artery.  Completion angiogram showed the proximal and mid SFA to be markedly improved with less than 20% residual  stenosis but there remained high-grade residual stenosis and irregularity throughout the Hunter canal and above-knee popliteal artery region.  The below-knee popliteal artery had less than 10% residual stenosis.  Using the CXI catheter and the 0.018 wire I was able to navigate into the posterior tibial artery with a mild amount of difficulty.  I was then able to cross the lesion confirming intraluminal flow in the distal posterior tibial artery with the CXI catheter before removing this.  A 2.5 mm diameter by 30 cm length angioplasty balloon was then inflated from the distal posterior tibial artery at the ankle up to the proximal posterior tibial artery.  A second inflation in the tibioperoneal trunk and proximal posterior tibial artery was used to treat the proximal portion.  The distal inflation was 12 atm for 1 minute and the proximal inflation was 14 atm for 1 minute less  than 30% residual stenosis was seen in the posterior tibial artery after treatment.  I then stented the distal SFA and popliteal artery down to just below the knee with a 6 mm diameter by 25 cm length Viabahn stent.  This was postdilated with a 5 mm diameter high-pressure angioplasty balloon with less than 10% residual stenosis.  I elected to treat the common femoral lesion with a 6 mm diameter by 8 cm length Lutonix drug-coated angioplasty balloon inflated to 10 atm for 1 minute.  He was a poor surgical candidate with his renal failure and multiple comorbidities, and I felt treating this lesion was reasonable for limb salvage.  Completion angiogram after this inflation showed only about a 30% residual stenosis in the common femoral artery and origin of the SFA which was not flow-limiting and I was pleased with the result. I elected to terminate the procedure. The sheath was removed and StarClose closure device was deployed in the right femoral artery with excellent hemostatic result. The patient was taken to the recovery room in stable  condition having tolerated the procedure well.  Findings:               Aortogram:  Normal renal arteries although the flow was somewhat sluggish with his chronic dialysis dependent renal failure.  There were not focal proximal stenoses.  The aorta and iliac arteries were somewhat calcific but no stenosis was identified             Left lower Extremity:  There was a heavily calcific and diseased common femoral artery with about an 80 to 85% stenosis.  The profunda femoris artery was calcific and small.  The SFA was diffusely diseased with a high-grade stenosis about 5 cm beyond its origin, and then multiple greater than 70% lesions down to a distal SFA occlusion.  The below-knee popliteal artery then reconstituted feet.  There were then heavily diseased anterior tibial artery with multiple areas of occlusion, a chronically occluded peroneal artery, and a left posterior tibial artery which was the best runoff but had about an 80% stenosis near its origin, another stenosis of about 70 to 75% in the mid segment, and a greater than 80% stenosis in the mid to distal segment.   Disposition: Patient was taken to the recovery room in stable condition having tolerated the procedure well.  Complications: None  Leotis Pain 11/16/2017 11:55 AM   This note was created with Dragon Medical transcription system. Any errors in dictation are purely unintentional.

## 2017-11-16 NOTE — H&P (Signed)
[facility] History & Physical Update  The patient was interviewed and re-examined.  The patient's previous History and Physical has been reviewed and is unchanged.  There is no change in the plan of care. We plan to proceed with the scheduled procedure.  Leotis Pain, MD  11/16/2017, 9:34 AM

## 2017-11-16 NOTE — Discharge Instructions (Signed)
Femoral Site Care Refer to this sheet in the next few weeks. These instructions provide you with information about caring for yourself after your procedure. Your health care provider may also give you more specific instructions. Your treatment has been planned according to current medical practices, but problems sometimes occur. Call your health care provider if you have any problems or questions after your procedure. What can I expect after the procedure? After your procedure, it is typical to have the following:  Bruising at the site that usually fades within 1-2 weeks.  Blood collecting in the tissue (hematoma) that may be painful to the touch. It should usually decrease in size and tenderness within 1-2 weeks.  Follow these instructions at home:   Take medicines only as directed by your health care provider.  If you take Metformin, hold for 48hours after your procedure.  The x-ray dye causes you to pass a considerate amount of urine.  For this reason, you will be asked to drink plenty of liquids after the procedure to prevent dehydration.  You may resume you regular diet.  Avoid caffeine products.    You may shower 24 hours after procedure. Leave the bandage on your access site.  You may wash around your dressing but do not rub the site, this may cause bleeding.  Pat the area dry with a clean towel.   Do not take baths, swim, or use a hot tub for 7 days.  Check your insertion site every day for redness, swelling, or drainage.  If you lose feeling or develop tingling or pain in your leg or foot after the procedure, please walk around first.  If the discomfort does not improve , contact your physician and proceed to the nearest emergency room.  Loss of feeling in your leg might mean that a blockage has formed in the artery and this can be appropriately treated.  Limit your activity for the next two days after your procedure.  Avoid stooping, bending, heavy lifting or exertion as this may put  pressure on the insertion site.  Resume normal activities in 48 hours.   check the insertion site occasionally.  If any oozing occurs or there is apparent swelling, firm pressure over the site will prevent a bruise from forming.  You can not hurt anything by pressing directly on the site.  The pressure stops the bleeding by allowing a small clot to form.  If the bleeding continues after the pressure has been applied for more than 15 minutes, call 911 or go to the nearest emergency room.     Apply pressure to access site if you have to laugh, cough, or sneeze.  Do not apply powder or lotion to the site.  Limit use of stairs to twice a day for the first 2-3 days or as directed by your health care provider.  Do not squat for the first 2-3 days or as directed by your health care provider.  Do not lift over 10 lb (4.5 kg) for 5 days after your procedure or as directed by your health care provider.  Ask your health care provider when it is okay to: ? Return to work or school. ? Resume usual physical activities or sports. ? Resume sexual activity.  Do not drive home if you are discharged the same day as the procedure. Have someone else drive you.  You may drive 48 hours after the procedure unless otherwise instructed by your health care provider.  Do not operate machinery or power tools  for 24 hours after the procedure or as directed by your health care provider.  If your procedure was done as an outpatient procedure, which means that you went home the same day as your procedure, a responsible adult should be with you for the first 24 hours after you arrive home.  Keep all follow-up visits as directed by your health care provider. This is important. Contact a health care provider if:  You have a fever.  You have chills.  You have increased bleeding from the site. Hold pressure on the site. Get help right away if:  You have unusual pain at the site.  You have redness, warmth, or  swelling at the site.  You have drainage (other than a small amount of blood on the dressing) from the site.  The site is bleeding, and the bleeding does not stop after 30 minutes of holding steady pressure on the site.  Your leg or foot becomes pale, cool, tingly, or numb. This information is not intended to replace advice given to you by your health care provider. Make sure you discuss any questions you have with your health care provider. Document Released: 12/27/2013 Document Revised: 10/01/2015 Document Reviewed: 11/12/2013 Elsevier Interactive Patient Education  2018 [employer]Femoral Site Care Refer to this sheet in the next few weeks. These instructions provide you with information about caring for yourself after your procedure. Your health care provider may also give you more specific instructions. Your treatment has been planned according to current medical practices, but problems sometimes occur. Call your health care provider if you have any problems or questions after your procedure. What can I expect after the procedure? After your procedure, it is typical to have the following:  Bruising at the site that usually fades within 1-2 weeks.  Blood collecting in the tissue (hematoma) that may be painful to the touch. It should usually decrease in size and tenderness within 1-2 weeks.  Follow these instructions at home:   Take medicines only as directed by your health care provider.  If you take Metformin, hold for 48hours after your procedure.  The x-ray dye causes you to pass a considerate amount of urine.  For this reason, you will be asked to drink plenty of liquids after the procedure to prevent dehydration.  You may resume you regular diet.  Avoid caffeine products.    You may shower 24 hours after procedure. Leave the bandage on your access site.  You may wash around your dressing but do not rub the site, this may cause bleeding.  Pat the area dry with a clean towel.    Do not take baths, swim, or use a hot tub for 7 days.  Check your insertion site every day for redness, swelling, or drainage.  If you lose feeling or develop tingling or pain in your leg or foot after the procedure, please walk around first.  If the discomfort does not improve , contact your physician and proceed to the nearest emergency room.  Loss of feeling in your leg might mean that a blockage has formed in the artery and this can be appropriately treated.  Limit your activity for the next two days after your procedure.  Avoid stooping, bending, heavy lifting or exertion as this may put pressure on the insertion site.  Resume normal activities in 48 hours.   check the insertion site occasionally.  If any oozing occurs or there is apparent swelling, firm pressure over the site will prevent a bruise from  forming.  You can not hurt anything by pressing directly on the site.  The pressure stops the bleeding by allowing a small clot to form.  If the bleeding continues after the pressure has been applied for more than 15 minutes, call 911 or go to the nearest emergency room.     Apply pressure to access site if you have to laugh, cough, or sneeze.  Do not apply powder or lotion to the site.  Limit use of stairs to twice a day for the first 2-3 days or as directed by your health care provider.  Do not squat for the first 2-3 days or as directed by your health care provider.  Do not lift over 10 lb (4.5 kg) for 5 days after your procedure or as directed by your health care provider.  Ask your health care provider when it is okay to: ? Return to work or school. ? Resume usual physical activities or sports. ? Resume sexual activity.  Do not drive home if you are discharged the same day as the procedure. Have someone else drive you.  You may drive 48 hours after the procedure unless otherwise instructed by your health care provider.  Do not operate machinery or power tools for 24 hours  after the procedure or as directed by your health care provider.  If your procedure was done as an outpatient procedure, which means that you went home the same day as your procedure, a responsible adult should be with you for the first 24 hours after you arrive home.  Keep all follow-up visits as directed by your health care provider. This is important. Contact a health care provider if:  You have a fever.  You have chills.  You have increased bleeding from the site. Hold pressure on the site. Get help right away if:  You have unusual pain at the site.  You have redness, warmth, or swelling at the site.  You have drainage (other than a small amount of blood on the dressing) from the site.  The site is bleeding, and the bleeding does not stop after 30 minutes of holding steady pressure on the site.  Your leg or foot becomes pale, cool, tingly, or numb. This information is not intended to replace advice given to you by your health care provider. Make sure you discuss any questions you have with your health care provider. Document Released: 12/27/2013 Document Revised: 10/01/2015 Document Reviewed: 11/12/2013 Elsevier Interactive Patient Education  [employer].

## 2017-11-17 ENCOUNTER — Other Ambulatory Visit: Payer: Self-pay

## 2017-11-17 ENCOUNTER — Encounter: Payer: Self-pay | Admitting: Vascular Surgery

## 2017-11-17 ENCOUNTER — Emergency Department
Admission: EM | Admit: 2017-11-17 | Discharge: 2017-11-18 | Disposition: A | Discharge status: Home / Self Care | Payer: Medicare Other | Attending: Emergency Medicine | Admitting: Emergency Medicine

## 2017-11-17 DIAGNOSIS — I5032 Chronic diastolic (congestive) heart failure: Secondary | ICD-10-CM | POA: Insufficient documentation

## 2017-11-17 DIAGNOSIS — Z7982 Long term (current) use of aspirin: Secondary | ICD-10-CM | POA: Insufficient documentation

## 2017-11-17 DIAGNOSIS — Z79899 Other long term (current) drug therapy: Secondary | ICD-10-CM | POA: Insufficient documentation

## 2017-11-17 DIAGNOSIS — Z87891 Personal history of nicotine dependence: Secondary | ICD-10-CM | POA: Diagnosis not present

## 2017-11-17 DIAGNOSIS — I130 Hypertensive heart and chronic kidney disease with heart failure and stage 1 through stage 4 chronic kidney disease, or unspecified chronic kidney disease: Secondary | ICD-10-CM | POA: Insufficient documentation

## 2017-11-17 DIAGNOSIS — N189 Chronic kidney disease, unspecified: Secondary | ICD-10-CM | POA: Insufficient documentation

## 2017-11-17 DIAGNOSIS — M79605 Pain in left leg: Secondary | ICD-10-CM | POA: Insufficient documentation

## 2017-11-17 NOTE — ED Triage Notes (Addendum)
Pt had two stents placed in left leg on Thursday for arterial clot. Pt complains of left leg pain from thigh to ankle. Pt with warm left foot, no palpable pedal pulse, but does have a palpable posterior tibial, marked. Pt with dialysis shunt to left arm.

## 2017-11-18 ENCOUNTER — Emergency Department: Payer: Medicare Other

## 2017-11-18 LAB — BASIC METABOLIC PANEL
Anion gap: 13 (ref 5–15)
BUN: 22 mg/dL (ref 8–23)
CO2: 28 mmol/L (ref 22–32)
CREATININE: 6.32 mg/dL — AB (ref 0.61–1.24)
Calcium: 8.9 mg/dL (ref 8.9–10.3)
Chloride: 97 mmol/L — ABNORMAL LOW (ref 98–111)
GFR, EST AFRICAN AMERICAN: 9 mL/min — AB (ref >60)
GFR, EST NON AFRICAN AMERICAN: 8 mL/min — AB (ref >60)
Glucose, Bld: 86 mg/dL (ref 70–99)
POTASSIUM: 4.5 mmol/L (ref 3.5–5.1)
SODIUM: 138 mmol/L (ref 135–145)

## 2017-11-18 LAB — CBC
HCT: 30 % — ABNORMAL LOW (ref 40.0–52.0)
Hemoglobin: 10.1 g/dL — ABNORMAL LOW (ref 13.0–18.0)
MCH: 31 pg (ref 26.0–34.0)
MCHC: 33.6 g/dL (ref 32.0–36.0)
MCV: 92.3 fL (ref 80.0–100.0)
Platelets: 155 10*3/uL (ref 150–440)
RBC: 3.25 MIL/uL — AB (ref 4.40–5.90)
RDW: 14.9 % — ABNORMAL HIGH (ref 11.5–14.5)
WBC: 9.3 10*3/uL (ref 3.8–10.6)

## 2017-11-18 MED ORDER — MORPHINE SULFATE (PF) 4 MG/ML IV SOLN
4.0000 mg | Freq: Once | INTRAVENOUS | Status: AC
  Administered 2017-11-18: 4 mg via INTRAVENOUS

## 2017-11-18 MED ORDER — CLINDAMYCIN HCL 150 MG PO CAPS
300.0000 mg | ORAL_CAPSULE | Freq: Once | ORAL | Status: AC
  Administered 2017-11-18: 300 mg via ORAL
  Filled 2017-11-18: qty 2

## 2017-11-18 MED ORDER — ONDANSETRON HCL 4 MG/2ML IJ SOLN
4.0000 mg | Freq: Once | INTRAMUSCULAR | Status: AC
  Administered 2017-11-18: 4 mg via INTRAVENOUS
  Filled 2017-11-18: qty 2

## 2017-11-18 MED ORDER — MORPHINE SULFATE (PF) 4 MG/ML IV SOLN
4.0000 mg | Freq: Once | INTRAVENOUS | Status: AC
  Administered 2017-11-18: 4 mg via INTRAVENOUS
  Filled 2017-11-18: qty 1

## 2017-11-18 MED ORDER — TRAMADOL HCL 50 MG PO TABS: 50.0000 mg | ORAL_TABLET | Freq: Four times a day (QID) | ORAL | 0 refills | Status: DC | PRN

## 2017-11-18 MED ORDER — MORPHINE SULFATE (PF) 4 MG/ML IV SOLN
INTRAVENOUS | Status: AC
  Filled 2017-11-18: qty 1

## 2017-11-18 NOTE — Discharge Instructions (Addendum)
Your blood work and ultrasounds are unremarkable.  Dr. Lucky Cowboy believes that this pain is due to reperfusion.  Please follow-up in the vascular surgery office for further evaluation of your leg pain.

## 2017-11-18 NOTE — ED Notes (Signed)
Butch, rn unsuccessful x2. Shanon Brow, rn to attempt ultrasound guided iv insertion.

## 2017-11-18 NOTE — ED Notes (Signed)
Patient transported to Ultrasound 

## 2017-11-18 NOTE — ED Notes (Addendum)
Butch, rn in to attempt ultrasound guided iv insertion. Eliezer Lofts, rn attempt iv insertion x1 without success. Pt receives dialysis MWF, last dialysis 11/17/2017. Not able to doppler pedal pulse to left foot. Pt with ulceration, size of a dime,  noted to left lateral foot with white wound bed noted, no drainage noted from ulcer.pt states area around ulcer is painful to touch. Pt states blister is painful.

## 2017-11-18 NOTE — ED Notes (Signed)
2 unsuccessful US-guided PIV attempts by this RN (right AC and right forearm); blood collection successful with samples sent to lab.

## 2017-11-18 NOTE — ED Provider Notes (Signed)
[facility] Emergency Department Provider Note   ____________________________________________   First MD Initiated Contact with Patient 11/18/17 0005     (approximate)  I have reviewed the triage vital signs and the nursing notes.   HISTORY  Chief Complaint Leg Pain    HPI [place]. is a 67 y.o. male who comes into the hospital today after having stents placed in his left femoral and popliteal arteries yesterday.  The patient states that since then he is developed some sharp pain in his left leg.  The patient has a blister to his left foot and he states that that is where the pain is primarily located but he did tell the EMS and the nurse that he was having pain up and down his entire left leg.  The patient denies having pain there previously.  He states that the pain is sharp and he has had some fever at home although he has not taken his temperature.  The patient had dialysis this morning and he did take his blood pressure medicines.  He was also concerned because his blood pressure is high.  The patient rates his pain a 9 out of 10 in intensity.   Past Medical History:  Diagnosis Date  . Anemia   . CHF (congestive heart failure) ([place])   . Chronic kidney disease   . Gout   . Hyperlipidemia   . Hypertension     Patient Active Problem List   Diagnosis Date Noted  . Atherosclerosis of native arteries of extremity with intermittent claudication ([place]) 11/07/2017  . Elevated troponin 10/02/2015  . Complications, dialysis, catheter, mechanical ([place]) 10/02/2015  . Musculoskeletal chest pain 09/28/2015  . ESRD on dialysis ([place]) 09/28/2015  . HTN (hypertension) 09/28/2015  . Chronic diastolic CHF (congestive heart failure) ([place]) 09/28/2015  . Gout 09/28/2015    Past Surgical History:  Procedure Laterality Date  . A/V SHUNTOGRAM Left 06/21/2017   Procedure: A/V SHUNTOGRAM;  Surgeon: Katha Cabal, MD;  Location: [place] [facility];   Service: Cardiovascular;  Laterality: Left;  . AV FISTULA PLACEMENT Left 09/18/2015   Procedure: INSERTION OF ARTERIOVENOUS (AV) GORE-TEX GRAFT ARM ( BRACH/AXILLARY GRAFT W/ INSTANT STICK GRAFT );  Surgeon: Katha Cabal, MD;  Location: [facility] OR[facility];  Service: Vascular;  Laterality: Left;  . DIALYSIS FISTULA CREATION    . LOWER EXTREMITY ANGIOGRAPHY Left 11/16/2017   Procedure: LOWER EXTREMITY ANGIOGRAPHY;  Surgeon: Algernon Huxley, MD;  Location: [place] [facility];  Service: Cardiovascular;  Laterality: Left;  . PERIPHERAL VASCULAR CATHETERIZATION Left 09/01/2015   Procedure: A/V Shuntogram/Fistulagram;  Surgeon: Katha Cabal, MD;  Location: [place] [facility];  Service: Cardiovascular;  Laterality: Left;  . PERIPHERAL VASCULAR CATHETERIZATION N/A 09/30/2015   Procedure: A/V Shuntogram/Fistulagram with perm cathether removal;  Surgeon: Algernon Huxley, MD;  Location: [place] [facility];  Service: Cardiovascular;  Laterality: N/A;  . PERIPHERAL VASCULAR CATHETERIZATION Left 09/30/2015   Procedure: A/V Shunt Intervention;  Surgeon: Algernon Huxley, MD;  Location: [place] [facility];  Service: Cardiovascular;  Laterality: Left;  . PERIPHERAL VASCULAR CATHETERIZATION Left 12/03/2015   Procedure: Thrombectomy;  Surgeon: Algernon Huxley, MD;  Location: [place] [facility];  Service: Cardiovascular;  Laterality: Left;  . PERIPHERAL VASCULAR CATHETERIZATION Left 01/28/2016   Procedure: Thrombectomy;  Surgeon: Algernon Huxley, MD;  Location: [place] [facility];  Service: Cardiovascular;  Laterality: Left;  . PERIPHERAL VASCULAR CATHETERIZATION N/A 01/28/2016   Procedure: A/V Shuntogram/Fistulagram;  Surgeon: Algernon Huxley, MD;  Location: [place] [facility];  Service: Cardiovascular;  Laterality: N/A;    Prior to Admission medications   Medication Sig Start Date End Date Taking? Authorizing Provider  allopurinol (ZYLOPRIM) 100 MG tablet Take 100 mg by mouth daily.    [provider]  amLODipine  (NORVASC) 10 MG tablet Take 10 mg by mouth.    [provider]  aspirin EC 81 MG tablet Take 1 tablet (81 mg total) by mouth daily. Patient not taking: Reported on 11/16/2017 12/03/15   Algernon Huxley, MD  atorvastatin (LIPITOR) 10 MG tablet Take 1 tablet (10 mg total) by mouth daily. 11/16/17 11/16/18  Algernon Huxley, MD  B-Complex-C-Biotin-Fe & FA (DIALYVITE 800/IRON PO) Take by mouth.    [provider]  calcium acetate (PHOSLO) 667 MG capsule Take 1,334 mg by mouth 3 (three) times daily with meals.    [provider]  Calcium Gluconate 50 MG TABS Take 50 mg by mouth. 05/19/11   [provider]  cinacalcet (SENSIPAR) 90 MG tablet Take 90 mg by mouth daily.     [provider]  clonazePAM (KLONOPIN) 0.5 MG tablet Take 0.5 mg by mouth at bedtime.    [provider]  clopidogrel (PLAVIX) 75 MG tablet Take 1 tablet (75 mg total) by mouth daily. 12/03/15   Algernon Huxley, MD  colchicine 0.6 MG tablet Take 0.6 mg by mouth daily as needed (for gout flares).     [provider]  furosemide (LASIX) 40 MG tablet Take 40 mg by mouth See admin instructions. Take 40 mg once daily on non dialysis days Tues, Fri, Sat, and Sun    [provider]  gabapentin (NEURONTIN) 100 MG capsule Take 200 mg by mouth 2 (two) times daily.    [provider]  hydrALAZINE (APRESOLINE) 25 MG tablet Take 25 mg by mouth daily.     [provider]  HYDROcodone-acetaminophen (NORCO/VICODIN) 5-325 MG tablet Take 1 tablet by mouth every 4 (four) hours as needed. Patient not taking: Reported on 11/16/2017 10/15/17   Harvest Dark, MD  ibuprofen (ADVIL,MOTRIN) 600 MG tablet Take 600 mg by mouth. 02/28/13   [provider]  irbesartan (AVAPRO) 150 MG tablet  05/31/17   [provider]  lidocaine-prilocaine (EMLA) cream Apply 1 application topically as needed (port access).    [provider]  metoprolol succinate (TOPROL-XL) 50 MG  24 hr tablet Take by mouth.    [provider]  ramipril (ALTACE) 10 MG capsule Take 10 mg by mouth at bedtime.     [provider]  sevelamer carbonate (RENVELA) 800 MG tablet Take 2,400 mg by mouth 3 (three) times daily with meals.     [provider]  traMADol (ULTRAM) 50 MG tablet Take 1 tablet (50 mg total) by mouth every 6 (six) hours as needed. 11/18/17   Loney Hering, MD    Allergies Shellfish allergy  Family History  Problem Relation Age of Onset  . Hypertension Unknown   . Heart disease Unknown     Social History Social History   Tobacco Use  . Smoking status: Former Research scientist (life sciences)  . Smokeless tobacco: Never Used  Substance Use Topics  . Alcohol use: No  . Drug use: No    Review of Systems  Constitutional: No fever/chills Eyes: No visual changes. ENT: No sore throat. Cardiovascular: Denies chest pain. Respiratory: Denies shortness of breath. Gastrointestinal: No abdominal pain.  No nausea,  no vomiting.  No diarrhea.  No constipation. Genitourinary: Negative for dysuria. Musculoskeletal: Left leg pain Skin: Ulcer to left lateral foot Neurological: Negative for headaches, focal weakness or numbness.   ____________________________________________   PHYSICAL EXAM:  VITAL SIGNS: ED Triage Vitals  Enc Vitals Group     BP 11/17/17 2357 (!) 210/89     Pulse Rate 11/17/17 2355 95     Resp 11/17/17 2355 20     Temp 11/17/17 2355 99 F (37.2 C)     Temp Source 11/17/17 2355 Oral     SpO2 11/17/17 2355 100 %     Weight 11/17/17 2356 176 lb (79.8 kg)     Height 11/17/17 2356 5\' 8"  (1.727 m)     Head Circumference --      Peak Flow --      Pain Score 11/17/17 2355 9     Pain Loc --      Pain Edu? --      Excl. in [place]? --     Constitutional: Alert and oriented. Well appearing and in moderate distress. Eyes: Conjunctivae are normal. PERRL. EOMI. Head: Atraumatic. Nose: No congestion/rhinnorhea. Mouth/Throat: Mucous membranes are  moist.  Oropharynx non-erythematous. Cardiovascular: Normal rate, regular rhythm.  Systolic murmur.  Unable to palpate a left dorsalis pedis pulse but posterior tibialis intact.  Left leg warm to the touch Respiratory: Normal respiratory effort.  No retractions. Lungs CTAB. Gastrointestinal: Soft and nontender. No distention.  Positive bowel sounds Musculoskeletal: No lower extremity tenderness nor edema.   Neurologic:  Normal speech and language.  Skin:  Skin is warm, dry and intact.  Nonhealing ulcer noted to left lateral foot with no drainage and no redness, also there is no fluctuance or swelling. Psychiatric: Mood and affect are normal.   ____________________________________________   LABS (all labs ordered are listed, but only abnormal results are displayed)  Labs Reviewed  BASIC METABOLIC PANEL - Abnormal; Notable for the following components:      Result Value   Chloride 97 (*)    Creatinine, Ser 6.32 (*)    GFR calc non Af Amer 8 (*)    GFR calc Af Amer 9 (*)    All other components within normal limits  CBC - Abnormal; Notable for the following components:   RBC 3.25 (*)    Hemoglobin 10.1 (*)    HCT 30.0 (*)    RDW 14.9 (*)    All other components within normal limits   ____________________________________________  EKG  none ____________________________________________  RADIOLOGY  ED MD interpretation: Ultrasound left lower extremity arterial: Patent appearing stent on limited exam  Left foot x-ray: No acute abnormality of the left foot, small focus of lateral soft tissue ulceration, unchanged first metatarsophalangeal joint arthritis.  Official radiology report(s): [name] Lower Ext Art Left  Result Date: 11/18/2017 CLINICAL DATA:  67 year old male with left leg pain and swelling. Recent left distal SFA stent placement on 11/16/2017. Evaluate for stent patency. EXAM: Left LOWER EXTREMITY ARTERIAL DUPLEX SCAN TECHNIQUE: Limited Gray-scale sonography as well as color  Doppler and duplex ultrasound was performed to evaluate the lower extremity arterial stent . COMPARISON:  None. FINDINGS: Limited evaluation of the left lower extremity arteries in the region of the stent demonstrate patency of the stent and vascular segment immediately proximal and distal to the stent. The peak systolic velocity within the stent is in the range of 74-115 centimeter/second. There is no parvus tardus. IMPRESSION: Patent appearing stent on limited exam. Electronically Signed  By: Anner Crete M.D.   On: 11/18/2017 03:50   Dg Foot Complete Left  Result Date: 11/18/2017 CLINICAL DATA:  Left foot ulceration EXAM: LEFT FOOT - COMPLETE 3+ VIEW COMPARISON:  None. 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 Extensive vascular calcification. No acute fracture or dislocation. Small focus of soft tissue ulceration along the lateral surface of the foot, at approximately the level of the metatarsal shaft. IMPRESSION: 1. No acute abnormality of the left foot. Small focus of lateral soft tissue ulceration. 2. Unchanged first metatarsophalangeal joint osteoarthrosis. Electronically Signed   By: Ulyses Jarred M.D.   On: 11/18/2017 05:36    ____________________________________________   PROCEDURES  Procedure(s) performed: None  Procedures  Critical Care performed: No  ____________________________________________   INITIAL IMPRESSION / ASSESSMENT AND PLAN / ED COURSE  As part of my medical decision making, I reviewed the following data within the electronic MEDICAL RECORD NUMBER Notes from prior ED visits and [place] Controlled Substance Database   This is a 67 year old male who comes into the hospital today with some leg pain.  My first concern was whether the patient's stents were still patent.  We were able to obtain an arterial ultrasound which showed that the patient stents were patent.  We also check some blood work on the patient which was  unremarkable.  I did give the patient 2 doses of morphine 4 mg IV.  I did contact Dr. Lucky Cowboy since the patient had recent surgery and he recommended that the patient was likely having some reperfusion pain.  He states that the patient had some significant disease and he can have pain with reperfusion.  He does recommend giving the patient tramadol.  The patient did receive a dose of tramadol emergency department.  He will be discharged home.      ____________________________________________   FINAL CLINICAL IMPRESSION(S) / ED DIAGNOSES  Final diagnoses:  Left leg pain     ED Discharge Orders        Ordered    traMADol (ULTRAM) 50 MG tablet  Every 6 hours PRN     11/18/17 0558       Note:  This document was prepared using Dragon voice recognition software and may include unintentional dictation errors.    Loney Hering, MD 11/18/17 [phone]

## 2017-11-18 NOTE — ED Notes (Signed)
Unsuccessful [name] IV attempt x1, patient has sclerotic veins and is not tolerating attempt well.  Primary nurse is putting in an IV team consult.

## 2017-11-18 NOTE — ED Notes (Signed)
Pt requesting additional pain medication. md notified.

## 2017-11-18 NOTE — ED Notes (Signed)
Pt requesting more pain medication. md notified.

## 2017-11-18 NOTE — ED Notes (Signed)
Iv team here 

## 2017-11-18 NOTE — ED Notes (Signed)
X-ray in progress 

## 2017-11-18 NOTE — ED Notes (Signed)
Lab here to venipuncture for cbc.

## 2017-11-19 ENCOUNTER — Emergency Department: Payer: Medicare Other

## 2017-11-19 ENCOUNTER — Other Ambulatory Visit: Payer: Self-pay

## 2017-11-19 ENCOUNTER — Observation Stay
Admission: EM | Admit: 2017-11-19 | Discharge: 2017-11-20 | Disposition: A | Discharge status: Home / Self Care | Payer: Medicare Other | Attending: Specialist | Admitting: Specialist

## 2017-11-19 DIAGNOSIS — N186 End stage renal disease: Secondary | ICD-10-CM | POA: Insufficient documentation

## 2017-11-19 DIAGNOSIS — R0602 Shortness of breath: Secondary | ICD-10-CM | POA: Diagnosis present

## 2017-11-19 DIAGNOSIS — I70244 Atherosclerosis of native arteries of left leg with ulceration of heel and midfoot: Secondary | ICD-10-CM | POA: Diagnosis not present

## 2017-11-19 DIAGNOSIS — D631 Anemia in chronic kidney disease: Secondary | ICD-10-CM | POA: Diagnosis not present

## 2017-11-19 DIAGNOSIS — Z992 Dependence on renal dialysis: Secondary | ICD-10-CM | POA: Diagnosis not present

## 2017-11-19 DIAGNOSIS — Z87891 Personal history of nicotine dependence: Secondary | ICD-10-CM | POA: Insufficient documentation

## 2017-11-19 DIAGNOSIS — M109 Gout, unspecified: Secondary | ICD-10-CM | POA: Insufficient documentation

## 2017-11-19 DIAGNOSIS — L97429 Non-pressure chronic ulcer of left heel and midfoot with unspecified severity: Secondary | ICD-10-CM | POA: Insufficient documentation

## 2017-11-19 DIAGNOSIS — E785 Hyperlipidemia, unspecified: Secondary | ICD-10-CM | POA: Insufficient documentation

## 2017-11-19 DIAGNOSIS — I132 Hypertensive heart and chronic kidney disease with heart failure and with stage 5 chronic kidney disease, or end stage renal disease: Secondary | ICD-10-CM | POA: Diagnosis not present

## 2017-11-19 DIAGNOSIS — I739 Peripheral vascular disease, unspecified: Secondary | ICD-10-CM | POA: Diagnosis present

## 2017-11-19 DIAGNOSIS — I429 Cardiomyopathy, unspecified: Secondary | ICD-10-CM | POA: Diagnosis not present

## 2017-11-19 DIAGNOSIS — Z7902 Long term (current) use of antithrombotics/antiplatelets: Secondary | ICD-10-CM | POA: Diagnosis not present

## 2017-11-19 DIAGNOSIS — R079 Chest pain, unspecified: Secondary | ICD-10-CM | POA: Diagnosis present

## 2017-11-19 DIAGNOSIS — I5042 Chronic combined systolic (congestive) and diastolic (congestive) heart failure: Secondary | ICD-10-CM | POA: Insufficient documentation

## 2017-11-19 DIAGNOSIS — N2581 Secondary hyperparathyroidism of renal origin: Secondary | ICD-10-CM | POA: Diagnosis not present

## 2017-11-19 DIAGNOSIS — Z95820 Peripheral vascular angioplasty status with implants and grafts: Secondary | ICD-10-CM | POA: Insufficient documentation

## 2017-11-19 LAB — CBC WITH DIFFERENTIAL/PLATELET
BASOS ABS: 0 10*3/uL (ref 0–0.1)
BASOS PCT: 0 %
EOS ABS: 0.1 10*3/uL (ref 0–0.7)
EOS PCT: 1 %
HCT: 31.4 % — ABNORMAL LOW (ref 40.0–52.0)
HEMOGLOBIN: 10.6 g/dL — AB (ref 13.0–18.0)
Lymphocytes Relative: 7 %
Lymphs Abs: 0.6 10*3/uL — ABNORMAL LOW (ref 1.0–3.6)
MCH: 31.5 pg (ref 26.0–34.0)
MCHC: 33.8 g/dL (ref 32.0–36.0)
MCV: 92.9 fL (ref 80.0–100.0)
Monocytes Absolute: 1 10*3/uL (ref 0.2–1.0)
Monocytes Relative: 11 %
NEUTROS PCT: 81 %
Neutro Abs: 7.6 10*3/uL — ABNORMAL HIGH (ref 1.4–6.5)
PLATELETS: 169 10*3/uL (ref 150–440)
RBC: 3.38 MIL/uL — ABNORMAL LOW (ref 4.40–5.90)
RDW: 15.2 % — ABNORMAL HIGH (ref 11.5–14.5)
WBC: 9.3 10*3/uL (ref 3.8–10.6)

## 2017-11-19 LAB — COMPREHENSIVE METABOLIC PANEL
ALBUMIN: 3.7 g/dL (ref 3.5–5.0)
ALT: <5 U/L (ref 0–44)
AST: 19 U/L (ref 15–41)
Alkaline Phosphatase: 46 U/L (ref 38–126)
Anion gap: 15 (ref 5–15)
BUN: 35 mg/dL — ABNORMAL HIGH (ref 8–23)
CHLORIDE: 96 mmol/L — AB (ref 98–111)
CO2: 26 mmol/L (ref 22–32)
CREATININE: 9.36 mg/dL — AB (ref 0.61–1.24)
Calcium: 8.8 mg/dL — ABNORMAL LOW (ref 8.9–10.3)
GFR calc non Af Amer: 5 mL/min — ABNORMAL LOW (ref >60)
GFR, EST AFRICAN AMERICAN: 6 mL/min — AB (ref >60)
GLUCOSE: 69 mg/dL — AB (ref 70–99)
Potassium: 4.3 mmol/L (ref 3.5–5.1)
SODIUM: 137 mmol/L (ref 135–145)
Total Bilirubin: 0.6 mg/dL (ref 0.3–1.2)
Total Protein: 7.6 g/dL (ref 6.5–8.1)

## 2017-11-19 LAB — TROPONIN I
Troponin I: 0.08 ng/mL (ref <0.03)
Troponin I: 0.08 ng/mL
Troponin I: 0.09 ng/mL (ref <0.03)
Troponin I: 0.08 ng/mL

## 2017-11-19 LAB — LACTIC ACID, PLASMA
Lactic Acid, Venous: 2 mmol/L (ref 0.5–1.9)
Lactic Acid, Venous: 1.1 mmol/L (ref 0.5–1.9)

## 2017-11-19 LAB — TSH: TSH: 1.466 u[IU]/mL (ref 0.350–4.500)

## 2017-11-19 MED ORDER — ONDANSETRON HCL 4 MG/2ML IJ SOLN
4.0000 mg | Freq: Four times a day (QID) | INTRAMUSCULAR | Status: DC | PRN
  Administered 2017-11-19: 4 mg via INTRAVENOUS
  Filled 2017-11-19: qty 2

## 2017-11-19 MED ORDER — MORPHINE SULFATE (PF) 4 MG/ML IV SOLN
INTRAVENOUS | Status: AC
  Filled 2017-11-19: qty 1

## 2017-11-19 MED ORDER — FUROSEMIDE 40 MG PO TABS
40.0000 mg | ORAL_TABLET | ORAL | Status: DC
  Administered 2017-11-19: 40 mg via ORAL
  Filled 2017-11-19: qty 1

## 2017-11-19 MED ORDER — NITROGLYCERIN 2 % TD OINT
1.0000 [in_us] | TOPICAL_OINTMENT | Freq: Once | TRANSDERMAL | Status: AC
Start: 2017-11-19 — End: 2017-11-19
  Administered 2017-11-19: 1 [in_us] via TOPICAL

## 2017-11-19 MED ORDER — CLONAZEPAM 0.5 MG PO TABS
0.5000 mg | ORAL_TABLET | Freq: Every day | ORAL | Status: DC
  Filled 2017-11-19: qty 1

## 2017-11-19 MED ORDER — LIDOCAINE-PRILOCAINE 2.5-2.5 % EX CREA
1.0000 "application " | TOPICAL_CREAM | CUTANEOUS | Status: DC | PRN
  Filled 2017-11-19: qty 5

## 2017-11-19 MED ORDER — MORPHINE SULFATE (PF) 4 MG/ML IV SOLN
4.0000 mg | Freq: Once | INTRAVENOUS | Status: AC
  Administered 2017-11-19: 4 mg via INTRAVENOUS

## 2017-11-19 MED ORDER — GABAPENTIN 100 MG PO CAPS
200.0000 mg | ORAL_CAPSULE | Freq: Two times a day (BID) | ORAL | Status: DC
  Administered 2017-11-19 – 2017-11-20 (×2): 200 mg via ORAL
  Filled 2017-11-19 (×3): qty 2

## 2017-11-19 MED ORDER — NITROGLYCERIN 2 % TD OINT
TOPICAL_OINTMENT | TRANSDERMAL | Status: AC
  Filled 2017-11-19: qty 1

## 2017-11-19 MED ORDER — COLCHICINE 0.6 MG PO TABS: 0.6000 mg | ORAL_TABLET | Freq: Every day | ORAL | Status: DC | PRN

## 2017-11-19 MED ORDER — MORPHINE SULFATE (PF) 2 MG/ML IV SOLN
1.0000 mg | INTRAVENOUS | Status: DC | PRN
  Administered 2017-11-19: 1 mg via INTRAVENOUS
  Filled 2017-11-19: qty 1

## 2017-11-19 MED ORDER — IRBESARTAN 150 MG PO TABS
300.0000 mg | ORAL_TABLET | Freq: Every day | ORAL | Status: DC
  Administered 2017-11-19 – 2017-11-20 (×2): 300 mg via ORAL
  Filled 2017-11-19 (×3): qty 2

## 2017-11-19 MED ORDER — CALCIUM ACETATE (PHOS BINDER) 667 MG PO CAPS
1334.0000 mg | ORAL_CAPSULE | Freq: Three times a day (TID) | ORAL | Status: DC
  Administered 2017-11-19 – 2017-11-20 (×4): 1334 mg via ORAL
  Filled 2017-11-19 (×4): qty 2

## 2017-11-19 MED ORDER — ALLOPURINOL 100 MG PO TABS
100.0000 mg | ORAL_TABLET | Freq: Every day | ORAL | Status: DC
  Administered 2017-11-19 – 2017-11-20 (×2): 100 mg via ORAL
  Filled 2017-11-19 (×2): qty 1

## 2017-11-19 MED ORDER — FUROSEMIDE 40 MG PO TABS: 40.0000 mg | ORAL_TABLET | ORAL | Status: DC

## 2017-11-19 MED ORDER — CALCIUM GLUCONATE 50 MG PO TABS: 50.0000 mg | ORAL_TABLET | Freq: Every day | ORAL | Status: DC

## 2017-11-19 MED ORDER — DOCUSATE SODIUM 100 MG PO CAPS
100.0000 mg | ORAL_CAPSULE | Freq: Two times a day (BID) | ORAL | Status: DC
  Administered 2017-11-19 – 2017-11-20 (×2): 100 mg via ORAL
  Filled 2017-11-19 (×3): qty 1

## 2017-11-19 MED ORDER — HEPARIN SODIUM (PORCINE) 5000 UNIT/ML IJ SOLN
5000.0000 [IU] | Freq: Three times a day (TID) | INTRAMUSCULAR | Status: DC
  Administered 2017-11-19 – 2017-11-20 (×3): 5000 [IU] via SUBCUTANEOUS
  Filled 2017-11-19 (×4): qty 1

## 2017-11-19 MED ORDER — AMLODIPINE BESYLATE 10 MG PO TABS
10.0000 mg | ORAL_TABLET | Freq: Every day | ORAL | Status: DC
  Administered 2017-11-19 – 2017-11-20 (×2): 10 mg via ORAL
  Filled 2017-11-19 (×2): qty 1

## 2017-11-19 MED ORDER — CLOPIDOGREL BISULFATE 75 MG PO TABS
75.0000 mg | ORAL_TABLET | Freq: Every day | ORAL | Status: DC
  Administered 2017-11-19 – 2017-11-20 (×2): 75 mg via ORAL
  Filled 2017-11-19 (×2): qty 1

## 2017-11-19 MED ORDER — ACETAMINOPHEN 325 MG PO TABS
650.0000 mg | ORAL_TABLET | Freq: Four times a day (QID) | ORAL | Status: DC | PRN
  Administered 2017-11-19: 650 mg via ORAL
  Filled 2017-11-19: qty 2

## 2017-11-19 MED ORDER — RENA-VITE PO TABS
1.0000 | ORAL_TABLET | Freq: Every day | ORAL | Status: DC
  Administered 2017-11-19 – 2017-11-20 (×2): 1 via ORAL
  Filled 2017-11-19 (×2): qty 1

## 2017-11-19 MED ORDER — ONDANSETRON HCL 4 MG PO TABS: 4.0000 mg | ORAL_TABLET | Freq: Four times a day (QID) | ORAL | Status: DC | PRN

## 2017-11-19 MED ORDER — ACETAMINOPHEN 650 MG RE SUPP: 650.0000 mg | Freq: Four times a day (QID) | RECTAL | Status: DC | PRN

## 2017-11-19 MED ORDER — HYDRALAZINE HCL 25 MG PO TABS
25.0000 mg | ORAL_TABLET | Freq: Every day | ORAL | Status: DC
  Administered 2017-11-19 – 2017-11-20 (×2): 25 mg via ORAL
  Filled 2017-11-19 (×2): qty 1

## 2017-11-19 MED ORDER — METOPROLOL SUCCINATE ER 50 MG PO TB24
50.0000 mg | ORAL_TABLET | Freq: Every day | ORAL | Status: DC
  Administered 2017-11-19 – 2017-11-20 (×2): 50 mg via ORAL
  Filled 2017-11-19 (×2): qty 1

## 2017-11-19 MED ORDER — TRAMADOL HCL 50 MG PO TABS
50.0000 mg | ORAL_TABLET | Freq: Four times a day (QID) | ORAL | Status: DC | PRN
  Administered 2017-11-20: 50 mg via ORAL
  Filled 2017-11-19: qty 1

## 2017-11-19 MED ORDER — PREGABALIN 50 MG PO CAPS
50.0000 mg | ORAL_CAPSULE | Freq: Every day | ORAL | Status: DC
  Administered 2017-11-19 – 2017-11-20 (×2): 50 mg via ORAL
  Filled 2017-11-19 (×2): qty 1

## 2017-11-19 MED ORDER — MORPHINE SULFATE (PF) 2 MG/ML IV SOLN
1.0000 mg | Freq: Four times a day (QID) | INTRAVENOUS | Status: DC | PRN
  Administered 2017-11-19 – 2017-11-20 (×3): 1 mg via INTRAVENOUS
  Filled 2017-11-19 (×3): qty 1

## 2017-11-19 MED ORDER — SEVELAMER CARBONATE 800 MG PO TABS
2400.0000 mg | ORAL_TABLET | Freq: Three times a day (TID) | ORAL | Status: DC
  Administered 2017-11-19 – 2017-11-20 (×4): 2400 mg via ORAL
  Filled 2017-11-19 (×4): qty 3

## 2017-11-19 MED ORDER — CINACALCET HCL 30 MG PO TABS
90.0000 mg | ORAL_TABLET | Freq: Every day | ORAL | Status: DC
  Administered 2017-11-19: 90 mg via ORAL
  Filled 2017-11-19 (×3): qty 3

## 2017-11-19 NOTE — ED Notes (Signed)
Critical lactic acid of 2.0 called from lab. Dr. Beather Arbour notified.

## 2017-11-19 NOTE — H&P (Signed)
Johnny [name]. is an 67 y.o. male.   Chief Complaint: Chest pain HPI: The patient with past medical history of CHF, ESRD and hypertension presents to the emergency department complaining of chest pain.  The patient states that he was walking to the restroom when he began to have centralized chest pressure associated with shortness of breath, nausea and diaphoresis.  The patient states that the symptoms have been intermittent but have eased off quite a bit since application of Nitropaste to his chest in the emergency department.  Opponent was noted to be slightly higher than baseline which prompted the emergency department staff to call hospitalist service for admission.  Past Medical History:  Diagnosis Date  . Anemia   . CHF (congestive heart failure) ([place])   . Chronic kidney disease   . Gout   . Hyperlipidemia   . Hypertension     Past Surgical History:  Procedure Laterality Date  . A/V SHUNTOGRAM Left 06/21/2017   Procedure: A/V SHUNTOGRAM;  Surgeon: Katha Cabal, MD;  Location: [place] [facility];  Service: Cardiovascular;  Laterality: Left;  . AV FISTULA PLACEMENT Left 09/18/2015   Procedure: INSERTION OF ARTERIOVENOUS (AV) GORE-TEX GRAFT ARM ( BRACH/AXILLARY GRAFT W/ INSTANT STICK GRAFT );  Surgeon: Katha Cabal, MD;  Location: [facility];  Service: Vascular;  Laterality: Left;  . DIALYSIS FISTULA CREATION    . LOWER EXTREMITY ANGIOGRAPHY Left 11/16/2017   Procedure: LOWER EXTREMITY ANGIOGRAPHY;  Surgeon: Algernon Huxley, MD;  Location: [place] [facility];  Service: Cardiovascular;  Laterality: Left;  . PERIPHERAL VASCULAR CATHETERIZATION Left 09/01/2015   Procedure: A/V Shuntogram/Fistulagram;  Surgeon: Katha Cabal, MD;  Location: [facility] [facility];  Service: Cardiovascular;  Laterality: Left;  . PERIPHERAL VASCULAR CATHETERIZATION N/A 09/30/2015   Procedure: A/V Shuntogram/Fistulagram with perm cathether removal;  Surgeon: Algernon Huxley, MD;  Location: [place]  [facility];  Service: Cardiovascular;  Laterality: N/A;  . PERIPHERAL VASCULAR CATHETERIZATION Left 09/30/2015   Procedure: A/V Shunt Intervention;  Surgeon: Algernon Huxley, MD;  Location: [place] [facility];  Service: Cardiovascular;  Laterality: Left;  . PERIPHERAL VASCULAR CATHETERIZATION Left 12/03/2015   Procedure: Thrombectomy;  Surgeon: Algernon Huxley, MD;  Location: [place] [facility];  Service: Cardiovascular;  Laterality: Left;  . PERIPHERAL VASCULAR CATHETERIZATION Left 01/28/2016   Procedure: Thrombectomy;  Surgeon: Algernon Huxley, MD;  Location: [facility] [facility];  Service: Cardiovascular;  Laterality: Left;  . PERIPHERAL VASCULAR CATHETERIZATION N/A 01/28/2016   Procedure: A/V Shuntogram/Fistulagram;  Surgeon: Algernon Huxley, MD;  Location: [facility] [facility];  Service: Cardiovascular;  Laterality: N/A;    Family History  Problem Relation Age of Onset  . Hypertension Unknown   . Heart disease Unknown    Social History:  reports that he has quit smoking. He has never used smokeless tobacco. He reports that he does not drink alcohol or use drugs.  Allergies:  Allergies  Allergen Reactions  . Shellfish Allergy Anaphylaxis    Medications Prior to Admission  Medication Sig Dispense Refill  . allopurinol (ZYLOPRIM) 100 MG tablet Take 100 mg by mouth daily.    [name] amLODipine (NORVASC) 10 MG tablet Take 10 mg by mouth.    . B-Complex-C-Biotin-Fe & FA (DIALYVITE 800/IRON PO) Take by mouth.    . calcium acetate (PHOSLO) 667 MG capsule Take 1,334 mg by mouth 3 (three) times daily with meals.    . Calcium Gluconate 50 MG TABS Take 50 mg by mouth.    [name]  cinacalcet (SENSIPAR) 90 MG tablet Take 90 mg by mouth daily.     . clonazePAM (KLONOPIN) 0.5 MG tablet Take 0.5 mg by mouth at bedtime.    . clopidogrel (PLAVIX) 75 MG tablet Take 1 tablet (75 mg total) by mouth daily. 30 tablet 11  . colchicine 0.6 MG tablet Take 0.6 mg by mouth daily as needed (for gout flares).     . furosemide (LASIX) 40 MG  tablet Take 40 mg by mouth See admin instructions. Take 40 mg once daily on non dialysis days Tues, Fri, Sat, and Sun    . gabapentin (NEURONTIN) 100 MG capsule Take 200 mg by mouth 2 (two) times daily.    . hydrALAZINE (APRESOLINE) 25 MG tablet Take 25 mg by mouth daily.     . irbesartan (AVAPRO) 150 MG tablet Take 300 mg by mouth daily.     . metoprolol succinate (TOPROL-XL) 50 MG 24 hr tablet Take 50 mg by mouth daily.     . pregabalin (LYRICA) 50 MG capsule Take 50 mg by mouth daily. After each dialysis session    . sevelamer carbonate (RENVELA) 800 MG tablet Take 2,400 mg by mouth 3 (three) times daily with meals.     . traMADol (ULTRAM) 50 MG tablet Take 1 tablet (50 mg total) by mouth every 6 (six) hours as needed. 12 tablet 0  . ibuprofen (ADVIL,MOTRIN) 600 MG tablet Take 600 mg by mouth.    . lidocaine-prilocaine (EMLA) cream Apply 1 application topically as needed (port access).      Results for orders placed or performed during the hospital encounter of 11/19/17 (from the past 48 hour(s))  CBC with Differential     Status: Abnormal   Collection Time: 11/19/17  1:46 AM  Result Value Ref Range   WBC 9.3 3.8 - 10.6 K/uL   RBC 3.38 (L) 4.40 - 5.90 MIL/uL   Hemoglobin 10.6 (L) 13.0 - 18.0 g/dL   HCT 31.4 (L) 40.0 - 52.0 %   MCV 92.9 80.0 - 100.0 fL   MCH 31.5 26.0 - 34.0 pg   MCHC 33.8 32.0 - 36.0 g/dL   RDW 15.2 (H) 11.5 - 14.5 %   Platelets 169 150 - 440 K/uL   Neutrophils Relative % 81 %   Neutro Abs 7.6 (H) 1.4 - 6.5 K/uL   Lymphocytes Relative 7 %   Lymphs Abs 0.6 (L) 1.0 - 3.6 K/uL   Monocytes Relative 11 %   Monocytes Absolute 1.0 0.2 - 1.0 K/uL   Eosinophils Relative 1 %   Eosinophils Absolute 0.1 0 - 0.7 K/uL   Basophils Relative 0 %   Basophils Absolute 0.0 0 - 0.1 K/uL    Comment: Performed at [facility], [address] [place], [place] [address]  Comprehensive metabolic panel     Status: Abnormal   Collection Time: 11/19/17  1:46 AM  Result Value  Ref Range   Sodium 137 135 - 145 mmol/L   Potassium 4.3 3.5 - 5.1 mmol/L   Chloride 96 (L) 98 - 111 mmol/L    Comment: Please note change in reference range.   CO2 26 22 - 32 mmol/L   Glucose, Bld 69 (L) 70 - 99 mg/dL    Comment: Please note change in reference range.   BUN 35 (H) 8 - 23 mg/dL    Comment: Please note change in reference range.   Creatinine, Ser 9.36 (H) 0.61 - 1.24 mg/dL   Calcium 8.8 (L) 8.9 -  10.3 mg/dL   Total Protein 7.6 6.5 - 8.1 g/dL   Albumin 3.7 3.5 - 5.0 g/dL   AST 19 15 - 41 U/L   ALT <5 0 - 44 U/L    Comment: Please note change in reference range.   Alkaline Phosphatase 46 38 - 126 U/L   Total Bilirubin 0.6 0.3 - 1.2 mg/dL   GFR calc non Af Amer 5 (L) >60 mL/min   GFR calc Af Amer 6 (L) >60 mL/min    Comment: (NOTE) The eGFR has been calculated using the CKD EPI equation. This calculation has not been validated in all clinical situations. eGFR's persistently <60 mL/min signify possible Chronic Kidney Disease.    Anion gap 15 5 - 15    Comment: Performed at [facility], [address] [place], [place] [address]  Troponin I     Status: Abnormal   Collection Time: 11/19/17  1:46 AM  Result Value Ref Range   Troponin I 0.08 (HH) <0.03 ng/mL    Comment: CRITICAL RESULT CALLED TO, READ BACK BY AND VERIFIED WITH APRIL BRUMGARD AT 0219 11/19/17.PMH Performed at [facility], [address] [place], [place] [address]   Lactic acid, plasma     Status: Abnormal   Collection Time: 11/19/17  1:46 AM  Result Value Ref Range   Lactic Acid, Venous 2.0 (HH) 0.5 - 1.9 mmol/L    Comment: CRITICAL RESULT CALLED TO, READ BACK BY AND VERIFIED WITH APRIL BRUMGARD AT 0255 11/19/17.PMH Performed at [facility], [place], [place] [address]   Lactic acid, plasma     Status: None   Collection Time: 11/19/17  3:24 AM  Result Value Ref Range   Lactic Acid, Venous 1.1 0.5 - 1.9 mmol/L    Comment: Performed at [facility], [place], [place] [address]   [name] Lower Ext Art Left  Result Date: 11/18/2017 CLINICAL DATA:  67 year old male with left leg pain and swelling. Recent left distal SFA stent placement on 11/16/2017. Evaluate for stent patency. EXAM: Left LOWER EXTREMITY ARTERIAL DUPLEX SCAN TECHNIQUE: Limited Gray-scale sonography as well as color Doppler and duplex ultrasound was performed to evaluate the lower extremity arterial stent . COMPARISON:  None. FINDINGS: Limited evaluation of the left lower extremity arteries in the region of the stent demonstrate patency of the stent and vascular segment immediately proximal and distal to the stent. The peak systolic velocity within the stent is in the range of 74-115 centimeter/second. There is no parvus tardus. IMPRESSION: Patent appearing stent on limited exam. Electronically Signed   By: Anner Crete M.D.   On: 11/18/2017 03:50   Dg Chest Port 1 View  Result Date: 11/19/2017 CLINICAL DATA:  Shortness of breath EXAM: PORTABLE CHEST 1 VIEW COMPARISON:  None. FINDINGS: The heart size and mediastinal contours are within normal limits. Both lungs are clear. The visualized skeletal structures are unremarkable. IMPRESSION: No active disease. Electronically Signed   By: Ulyses Jarred M.D.   On: 11/19/2017 02:39   Dg Foot Complete Left  Result Date: 11/19/2017 CLINICAL DATA:  Foot ulcer EXAM: LEFT FOOT - COMPLETE 3+ VIEW COMPARISON:  11/18/2017 FINDINGS: Unchanged soft tissue ulceration at the lateral aspect of the left hindfoot. No evidence of active osteomyelitis. IMPRESSION: Unchanged examination with soft tissue ulceration at the lateral left hindfoot. No evidence of active infectious osteitis. Electronically Signed   By: Ulyses Jarred M.D.   On: 11/19/2017 02:58   Dg Foot  Complete Left  Result Date: 11/18/2017 CLINICAL DATA:  Left foot ulceration EXAM: LEFT FOOT - COMPLETE 3+ VIEW COMPARISON:  None. 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 Extensive vascular calcification. No acute fracture or dislocation. Small focus of soft tissue ulceration along the lateral surface of the foot, at approximately the level of the metatarsal shaft. IMPRESSION: 1. No acute abnormality of the left foot. Small focus of lateral soft tissue ulceration. 2. Unchanged first metatarsophalangeal joint osteoarthrosis. Electronically Signed   By: Ulyses Jarred M.D.   On: 11/18/2017 05:36    Review of Systems  Constitutional: Positive for diaphoresis. Negative for chills and fever.  HENT: Negative for sore throat and tinnitus.   Eyes: Negative for blurred vision and redness.  Respiratory: Positive for shortness of breath. Negative for cough.   Cardiovascular: Positive for chest pain. Negative for palpitations, orthopnea and PND.  Gastrointestinal: Positive for nausea. Negative for abdominal pain, diarrhea and vomiting.  Genitourinary: Negative for dysuria, frequency and urgency.  Musculoskeletal: Negative for joint pain and myalgias.  Skin: Negative for rash.       No lesions  Neurological: Negative for speech change, focal weakness and weakness.  Endo/Heme/Allergies: Does not bruise/bleed easily.       No temperature intolerance  Psychiatric/Behavioral: Negative for depression and suicidal ideas.    Blood pressure (!) 183/89, pulse 98, temperature 100.2 F (37.9 C), temperature source Oral, resp. rate 18, height _0  (1.727 m), weight 79.8 kg (176 lb), SpO2 100 %. Physical Exam  Vitals reviewed. Constitutional: He is oriented to person, place, and time. He appears well-developed and well-nourished. No distress.  HENT:  Head: Normocephalic and atraumatic.  Mouth/Throat: Oropharynx is clear and moist.  Eyes: Pupils are equal, round, and reactive to light. Conjunctivae and EOM are normal. No scleral icterus.  Neck: Normal range of motion. Neck supple. No JVD present. No tracheal deviation present. No  thyromegaly present.  Cardiovascular: Normal rate and regular rhythm. Exam reveals no gallop and no friction rub.  No murmur heard. Respiratory: Effort normal and breath sounds normal. No respiratory distress.  GI: Soft. Bowel sounds are normal. He exhibits no distension. There is no tenderness.  Genitourinary:  Genitourinary Comments: Deferred  Musculoskeletal: Normal range of motion. He exhibits no edema.  Lymphadenopathy:    He has no cervical adenopathy.  Neurological: He is alert and oriented to person, place, and time. No cranial nerve deficit.  Skin: Skin is warm and dry. No rash noted. No erythema.  Psychiatric: He has a normal mood and affect. His behavior is normal. Judgment and thought content normal.     Assessment/Plan This is a 67 year old male admitted for chest pain. 1.  Chest pain: Elevated troponin and typical anginal symptoms.  Follow cardiac enzymes.  Consult cardiology.  Monitor telemetry. 2.  ESRD: On dialysis Monday Wednesday Friday.  Consult nephrology for continuation of dialysis.  Continue Sensipar, Renvela and PhosLo 3.  PAD: Status post femoral stenting earlier this week.  Continue Lasix 4.  CHF: Chronic; diastolic.  Continue metoprolol and ARB.  Lasix per home regimen.  The patient is also on amlodipine 5.  Gout: Stable; continue allopurinol 6.  DVT prophylaxis: Heparin 7.  GI prophylaxis: None Patient is full code.  Time spent on admission orders and patient care approximately 45 minutes  Harrie Foreman, MD 11/19/2017, 5:49 AM

## 2017-11-19 NOTE — ED Notes (Signed)
Lab to return at 0330 for second set of blood cultures and repeat lactic acid.

## 2017-11-19 NOTE — ED Notes (Signed)
Dr. Diamond in to see pt.  

## 2017-11-19 NOTE — Care Management Obs Status (Signed)
[facility] NOTIFICATION   Patient Details  Name: Johnny [name]. MRN: [id number] Date of Birth: [birth year]   Medicare Observation Status Notification Given:  Yes    Yida Hyams A Pattiann Solanki, RN 11/19/2017, 8:13 AM

## 2017-11-19 NOTE — Progress Notes (Signed)
[facility]  ROUNDING NOTE   Subjective:  Patient well-known to [name] as we follow him for hemodialysis on MWF. He states that he did have dialysis on Monday. He presents now with chest pain. Cardiology has been consulted. Earlier last week he had arterial stent placement in his left lower extremity.   Objective:  Vital signs in last 24 hours:  Temp:  [98.2 F (36.8 C)-100.2 F (37.9 C)] 98.2 F (36.8 C) (07/14 0729) Pulse Rate:  [94-113] 94 (07/14 0729) Resp:  [15-25] 18 (07/14 0507) BP: (145-189)/(70-89) 145/77 (07/14 0729) SpO2:  [99 %-100 %] 100 % (07/14 0729) Weight:  [79.8 kg (176 lb)] 79.8 kg (176 lb) (07/14 0107)  Weight change:  Filed Weights   11/19/17 0107  Weight: 79.8 kg (176 lb)    Intake/Output: No intake/output data recorded.   Intake/Output this shift:  Total I/O In: 120 [P.O.:120] Out: 0   Physical Exam: General: No acute distress  Head: Normocephalic, atraumatic. Moist oral mucosal membranes  Eyes: Anicteric  Neck: Supple, trachea midline  Lungs:  Clear to auscultation, normal effort  Heart: S1S2 no rubs  Abdomen:  Soft, nontender, bowel sounds present  Extremities: No peripheral edema.  Neurologic: Awake, alert, following commands  Skin: Ulceration noted in left foot  Access: LUE AVG    Basic Metabolic Panel: Recent Labs  Lab 11/18/17 0016 11/19/17 0146  NA 138 137  K 4.5 4.3  CL 97* 96*  CO2 28 26  GLUCOSE 86 69*  BUN 22 35*  CREATININE 6.32* 9.36*  CALCIUM 8.9 8.8*    Liver Function Tests: Recent Labs  Lab 11/19/17 0146  AST 19  ALT <5  ALKPHOS 46  BILITOT 0.6  PROT 7.6  ALBUMIN 3.7   No results for input(s): LIPASE, AMYLASE in the last 168 hours. No results for input(s): AMMONIA in the last 168 hours.  CBC: Recent Labs  Lab 11/18/17 0056 11/19/17 0146  WBC 9.3 9.3  NEUTROABS  --  7.6*  HGB 10.1* 10.6*  HCT 30.0* 31.4*  MCV 92.3 92.9  PLT 155 169    Cardiac Enzymes: Recent Labs  Lab  11/19/17 0146 11/19/17 0528 11/19/17 1113  TROPONINI 0.08* 0.08* 0.09*    BNP: Invalid input(s): POCBNP  CBG: No results for input(s): GLUCAP in the last 168 hours.  Microbiology: Results for orders placed or performed during the hospital encounter of 11/19/17  Culture, blood (routine x 2)     Status: None (Preliminary result)   Collection Time: 11/19/17  1:46 AM  Result Value Ref Range Status   Specimen Description BLOOD RIGHT HAND  Final   Special Requests   Final    BOTTLES DRAWN AEROBIC AND ANAEROBIC Blood Culture adequate volume   Culture   Final    NO GROWTH < 12 HOURS Performed at [facility], [address] [place], [place] [address]    Report Status PENDING  Incomplete  Culture, blood (routine x 2)     Status: None (Preliminary result)   Collection Time: 11/19/17  3:24 AM  Result Value Ref Range Status   Specimen Description BLOOD BLOOD RIGHT HAND  Final   Special Requests   Final    BOTTLES DRAWN AEROBIC AND ANAEROBIC Blood Culture adequate volume   Culture   Final    NO GROWTH < 12 HOURS Performed at [facility], [address], [place], [place] [address]    Report Status PENDING  Incomplete    Coagulation Studies: No results  for input(s): LABPROT, INR in the last 72 hours.  Urinalysis: No results for input(s): COLORURINE, LABSPEC, PHURINE, GLUCOSEU, HGBUR, BILIRUBINUR, KETONESUR, PROTEINUR, UROBILINOGEN, NITRITE, LEUKOCYTESUR in the last 72 hours.  Invalid input(s): APPERANCEUR    Imaging: [name] Lower Ext Art Left  Result Date: 11/18/2017 CLINICAL DATA:  67 year old male with left leg pain and swelling. Recent left distal SFA stent placement on 11/16/2017. Evaluate for stent patency. EXAM: Left LOWER EXTREMITY ARTERIAL DUPLEX SCAN TECHNIQUE: Limited Gray-scale sonography as well as color Doppler and duplex ultrasound was performed to evaluate the lower extremity arterial stent . COMPARISON:  None. FINDINGS: Limited evaluation of the  left lower extremity arteries in the region of the stent demonstrate patency of the stent and vascular segment immediately proximal and distal to the stent. The peak systolic velocity within the stent is in the range of 74-115 centimeter/second. There is no parvus tardus. IMPRESSION: Patent appearing stent on limited exam. Electronically Signed   By: Anner Crete M.D.   On: 11/18/2017 03:50   Dg Chest Port 1 View  Result Date: 11/19/2017 CLINICAL DATA:  Shortness of breath EXAM: PORTABLE CHEST 1 VIEW COMPARISON:  None. FINDINGS: The heart size and mediastinal contours are within normal limits. Both lungs are clear. The visualized skeletal structures are unremarkable. IMPRESSION: No active disease. Electronically Signed   By: Ulyses Jarred M.D.   On: 11/19/2017 02:39   Dg Foot Complete Left  Result Date: 11/19/2017 CLINICAL DATA:  Foot ulcer EXAM: LEFT FOOT - COMPLETE 3+ VIEW COMPARISON:  11/18/2017 FINDINGS: Unchanged soft tissue ulceration at the lateral aspect of the left hindfoot. No evidence of active osteomyelitis. IMPRESSION: Unchanged examination with soft tissue ulceration at the lateral left hindfoot. No evidence of active infectious osteitis. Electronically Signed   By: Ulyses Jarred M.D.   On: 11/19/2017 02:58   Dg Foot Complete Left  Result Date: 11/18/2017 CLINICAL DATA:  Left foot ulceration EXAM: LEFT FOOT - COMPLETE 3+ VIEW COMPARISON:  None. 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 Extensive vascular calcification. No acute fracture or dislocation. Small focus of soft tissue ulceration along the lateral surface of the foot, at approximately the level of the metatarsal shaft. IMPRESSION: 1. No acute abnormality of the left foot. Small focus of lateral soft tissue ulceration. 2. Unchanged first metatarsophalangeal joint osteoarthrosis. Electronically Signed   By: Ulyses Jarred M.D.   On: 11/18/2017 05:36     Medications:    .  allopurinol  100 mg Oral Daily  . amLODipine  10 mg Oral Daily  . calcium acetate  1,334 mg Oral TID WC  . cinacalcet  90 mg Oral Q breakfast  . clonazePAM  0.5 mg Oral QHS  . clopidogrel  75 mg Oral Daily  . docusate sodium  100 mg Oral BID  . furosemide  40 mg Oral Once per day on Sun Tue Thu Sat  . gabapentin  200 mg Oral BID  . heparin  5,000 Units Subcutaneous Q8H  . hydrALAZINE  25 mg Oral Daily  . irbesartan  300 mg Oral Daily  . metoprolol succinate  50 mg Oral Daily  . multivitamin  1 tablet Oral Daily  . pregabalin  50 mg Oral Daily  . sevelamer carbonate  2,400 mg Oral TID WC   acetaminophen **OR** acetaminophen, colchicine, lidocaine-prilocaine, morphine injection, ondansetron **OR** ondansetron (ZOFRAN) IV, traMADol  Assessment/ Plan:  67 y.o. male  with pmhx of ESRD, cardiomyopathy, hypertension, anemia of ckd, shpth, e. coli bacteremia  9/12, left hand inflammatory arthritis resolved, angioplasty cephalic vein 3/70, access declot 9/15. colonoscopy 02/2015, admission for chest pain 11/18/17.   [facility]/MWF/LUE AVG/EDW 76.5  1.  ESRD on HD MWF.  Patient due for hemodialysis tomorrow.  No urgent indication for dialysis at the moment as he does not appear to be volume overloaded.  2.  Anemia of chronic kidney disease.  Hemoglobin currently 10.6.  Hold off on Epogen for now.  3.  Secondary hyperparathyroidism.  Check PTH and phosphorus with next dialysis treatment.  Otherwise continue current doses of Renvela as well as Sensipar.  4.  Hypertension.  Continue amlodipine, hydralazine, irbesartan, and metoprolol.   LOS: 0 Kelita Wallis 7/14/201912:46 PM

## 2017-11-19 NOTE — ED Notes (Signed)
Iv team here for iv insertion.

## 2017-11-19 NOTE — Progress Notes (Signed)
[facility] NAME: Johnny [name]    MR#:  [id number]  DATE OF BIRTH:  [birth year]  SUBJECTIVE:   Came in with chest pain and shortness of breath on and off for 1 to 2 days. He had angiogram of his left lower extremity with angioplasty. Complains of some leg pain on the left. No chest pain today REVIEW OF SYSTEMS:   Review of Systems  Constitutional: Negative for chills, fever and weight loss.  HENT: Negative for ear discharge, ear pain and nosebleeds.   Eyes: Negative for blurred vision, pain and discharge.  Respiratory: Negative for sputum production, shortness of breath, wheezing and stridor.   Cardiovascular: Negative for chest pain, palpitations, orthopnea and PND.  Gastrointestinal: Negative for abdominal pain, diarrhea, nausea and vomiting.  Genitourinary: Negative for frequency and urgency.  Musculoskeletal: Negative for back pain and joint pain.  Neurological: Negative for sensory change, speech change, focal weakness and weakness.  Psychiatric/Behavioral: Negative for depression and hallucinations. The patient is not nervous/anxious.    Tolerating Diet:yes Tolerating PT: pending  DRUG ALLERGIES:   Allergies  Allergen Reactions  . Shellfish Allergy Anaphylaxis    VITALS:  Blood pressure (!) 145/77, pulse 94, temperature 98.2 F (36.8 C), temperature source Oral, resp. rate 18, height 5\' 8"  (1.727 m), weight 79.8 kg (176 lb), SpO2 100 %.  PHYSICAL EXAMINATION:   Physical Exam  GENERAL:  67 y.o.-year-old patient lying in the bed with no acute distress.  EYES: Pupils equal, round, reactive to light and accommodation. No scleral icterus. Extraocular muscles intact.  HEENT: Head atraumatic, normocephalic. Oropharynx and nasopharynx clear.  NECK:  Supple, no jugular venous distention. No thyroid enlargement, no tenderness.  LUNGS: Normal breath sounds bilaterally, no wheezing, rales, rhonchi. No use of accessory  muscles of respiration.  CARDIOVASCULAR: S1, S2 normal. No murmurs, rubs, or gallops.  ABDOMEN: Soft, nontender, nondistended. Bowel sounds present. No organomegaly or mass.  EXTREMITIES: No cyanosis, clubbing or edema b/l.    NEUROLOGIC: Cranial nerves II through XII are intact. No focal Motor or sensory deficits b/l.   PSYCHIATRIC:  patient is alert and oriented x 3.  SKIN: No obvious rash, lesion, or ulcer.   LABORATORY PANEL:  CBC Recent Labs  Lab 11/19/17 0146  WBC 9.3  HGB 10.6*  HCT 31.4*  PLT 169    Chemistries  Recent Labs  Lab 11/19/17 0146  NA 137  K 4.3  CL 96*  CO2 26  GLUCOSE 69*  BUN 35*  CREATININE 9.36*  CALCIUM 8.8*  AST 19  ALT <5  ALKPHOS 46  BILITOT 0.6   Cardiac Enzymes Recent Labs  Lab 11/19/17 1113  TROPONINI 0.09*   RADIOLOGY:  [name] Lower Ext Art Left  Result Date: 11/18/2017 CLINICAL DATA:  67 year old male with left leg pain and swelling. Recent left distal SFA stent placement on 11/16/2017. Evaluate for stent patency. EXAM: Left LOWER EXTREMITY ARTERIAL DUPLEX SCAN TECHNIQUE: Limited Gray-scale sonography as well as color Doppler and duplex ultrasound was performed to evaluate the lower extremity arterial stent . COMPARISON:  None. FINDINGS: Limited evaluation of the left lower extremity arteries in the region of the stent demonstrate patency of the stent and vascular segment immediately proximal and distal to the stent. The peak systolic velocity within the stent is in the range of 74-115 centimeter/second. There is no parvus tardus. IMPRESSION: Patent appearing stent on limited exam. Electronically Signed   By: Laren Everts.D.  On: 11/18/2017 03:50   Dg Chest Port 1 View  Result Date: 11/19/2017 CLINICAL DATA:  Shortness of breath EXAM: PORTABLE CHEST 1 VIEW COMPARISON:  None. FINDINGS: The heart size and mediastinal contours are within normal limits. Both lungs are clear. The visualized skeletal structures are unremarkable.  IMPRESSION: No active disease. Electronically Signed   By: Ulyses Jarred M.D.   On: 11/19/2017 02:39   Dg Foot Complete Left  Result Date: 11/19/2017 CLINICAL DATA:  Foot ulcer EXAM: LEFT FOOT - COMPLETE 3+ VIEW COMPARISON:  11/18/2017 FINDINGS: Unchanged soft tissue ulceration at the lateral aspect of the left hindfoot. No evidence of active osteomyelitis. IMPRESSION: Unchanged examination with soft tissue ulceration at the lateral left hindfoot. No evidence of active infectious osteitis. Electronically Signed   By: Ulyses Jarred M.D.   On: 11/19/2017 02:58   Dg Foot Complete Left  Result Date: 11/18/2017 CLINICAL DATA:  Left foot ulceration EXAM: LEFT FOOT - COMPLETE 3+ VIEW COMPARISON:  None. 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 Extensive vascular calcification. No acute fracture or dislocation. Small focus of soft tissue ulceration along the lateral surface of the foot, at approximately the level of the metatarsal shaft. IMPRESSION: 1. No acute abnormality of the left foot. Small focus of lateral soft tissue ulceration. 2. Unchanged first metatarsophalangeal joint osteoarthrosis. Electronically Signed   By: Ulyses Jarred M.D.   On: 11/18/2017 05:36   ASSESSMENT AND PLAN:   67 year old black male with end-stage renal disease peripheral vascular disease nonhealing left leg ulcer recent peripheral vascular disease intervention to help with circulation patient has history of mild cardiomyopathy with congestive heart failure, dialysis for end-stage renal disease has hyperlipidemia and hypertension states that over the past few days he started having chest discomfort shortness of breath tightness dyspnea on exertion with minimal activity.   1.  Chest pain: Elevated troponin and typical anginal symptoms.   -Follow cardiac enzymes. -  Consult cardiology with Dr Clayborn Bigness. myoview stress test tomorrow  2.  ESRD: On dialysis Monday Wednesday Friday.    -Consult nephrology for continuation of dialysis.  Continue Sensipar, Renvela and PhosLo  3.  PAD: Status post femoral stenting earlier last week.  Continue plavix  4.  CHF: Chronic; diastolic.  Continue amlodipine, metoprolol and ARB.  Lasix per home regimen.   5.  Gout: Stable; continue allopurinol  6.  DVT prophylaxis: Heparin  PT to see pt  Case discussed with Care Management/Social Worker. Management plans discussed with the patient, family and they are in agreement.  CODE STATUS: full  DVT Prophylaxis: heparin  TOTAL TIME TAKING CARE OF THIS PATIENT: *30* minutes.  >50% time spent on counselling and coordination of care  POSSIBLE D/C IN *1-2* DAYS, DEPENDING ON CLINICAL CONDITION.  Note: This dictation was prepared with Dragon dictation along with smaller phrase technology. Any transcriptional errors that result from this process are unintentional.  Fritzi Mandes M.D on 11/19/2017 at 12:36 PM  Between 7am to 6pm - Pager - [phone]  After 6pm go to [email or web address] - Proofreader  [facility]  Office  [phone]  CC: Primary care physician; [name], MDPatient ID: [name], male   DOB: 10-Dec-1950, 67 y.o.   MRN: [id number]

## 2017-11-19 NOTE — ED Provider Notes (Signed)
[facility] Emergency Department Provider Note   ____________________________________________   First MD Initiated Contact with Patient 11/19/17 0240     (approximate)  I have reviewed the triage vital signs and the nursing notes.   HISTORY  Chief Complaint Shortness of Breath and Leg Pain    HPI Johnny [name]. is a 67 y.o. male brought to the ED from home via EMS with a chief complaint of chest pain and shortness of breath.  Patient has a history of ESRD on HD M/W/F who was dialyzed on Friday.  Still with a history of PAD status post stent placement this week and his left femoral and popliteal arteries.  He was seen last night in the ED for reperfusion pain in that leg.  Tonight patient reports he was at rest when he felt left-sided chest tightness associated with shortness of breath.  Denies recent fever, chills, abdominal pain, nausea, vomiting, diaphoresis.  Patient is oliguric.   Past Medical History:  Diagnosis Date  . Anemia   . CHF (congestive heart failure) ([place])   . Chronic kidney disease   . Gout   . Hyperlipidemia   . Hypertension     Patient Active Problem List   Diagnosis Date Noted  . Chest pain 11/19/2017  . Atherosclerosis of native arteries of extremity with intermittent claudication ([place]) 11/07/2017  . Elevated troponin 10/02/2015  . Complications, dialysis, catheter, mechanical ([place]) 10/02/2015  . Musculoskeletal chest pain 09/28/2015  . ESRD on dialysis ([place]) 09/28/2015  . HTN (hypertension) 09/28/2015  . Chronic diastolic CHF (congestive heart failure) ([place]) 09/28/2015  . Gout 09/28/2015    Past Surgical History:  Procedure Laterality Date  . A/V SHUNTOGRAM Left 06/21/2017   Procedure: A/V SHUNTOGRAM;  Surgeon: Katha Cabal, MD;  Location: [place] [facility];  Service: Cardiovascular;  Laterality: Left;  . AV FISTULA PLACEMENT Left 09/18/2015   Procedure: INSERTION OF ARTERIOVENOUS (AV) GORE-TEX GRAFT ARM (  BRACH/AXILLARY GRAFT W/ INSTANT STICK GRAFT );  Surgeon: Katha Cabal, MD;  Location: [facility] ORS;  Service: Vascular;  Laterality: Left;  . DIALYSIS FISTULA CREATION    . LOWER EXTREMITY ANGIOGRAPHY Left 11/16/2017   Procedure: LOWER EXTREMITY ANGIOGRAPHY;  Surgeon: Algernon Huxley, MD;  Location: [place] [facility];  Service: Cardiovascular;  Laterality: Left;  . PERIPHERAL VASCULAR CATHETERIZATION Left 09/01/2015   Procedure: A/V Shuntogram/Fistulagram;  Surgeon: Katha Cabal, MD;  Location: [place] [facility];  Service: Cardiovascular;  Laterality: Left;  . PERIPHERAL VASCULAR CATHETERIZATION N/A 09/30/2015   Procedure: A/V Shuntogram/Fistulagram with perm cathether removal;  Surgeon: Algernon Huxley, MD;  Location: [place] [facility];  Service: Cardiovascular;  Laterality: N/A;  . PERIPHERAL VASCULAR CATHETERIZATION Left 09/30/2015   Procedure: A/V Shunt Intervention;  Surgeon: Algernon Huxley, MD;  Location: [place] [facility];  Service: Cardiovascular;  Laterality: Left;  . PERIPHERAL VASCULAR CATHETERIZATION Left 12/03/2015   Procedure: Thrombectomy;  Surgeon: Algernon Huxley, MD;  Location: [place] [facility];  Service: Cardiovascular;  Laterality: Left;  . PERIPHERAL VASCULAR CATHETERIZATION Left 01/28/2016   Procedure: Thrombectomy;  Surgeon: Algernon Huxley, MD;  Location: [place] [facility];  Service: Cardiovascular;  Laterality: Left;  . PERIPHERAL VASCULAR CATHETERIZATION N/A 01/28/2016   Procedure: A/V Shuntogram/Fistulagram;  Surgeon: Algernon Huxley, MD;  Location: [place] [facility];  Service: Cardiovascular;  Laterality: N/A;    Prior to Admission medications   Medication Sig Start Date End Date Taking? Authorizing Provider  allopurinol (  ZYLOPRIM) 100 MG tablet Take 100 mg by mouth daily.   Yes [provider]  amLODipine (NORVASC) 10 MG tablet Take 10 mg by mouth.   Yes [provider]  B-Complex-C-Biotin-Fe & FA (DIALYVITE 800/IRON PO) Take by mouth.   Yes  [provider]  calcium acetate (PHOSLO) 667 MG capsule Take 1,334 mg by mouth 3 (three) times daily with meals.   Yes [provider]  Calcium Gluconate 50 MG TABS Take 50 mg by mouth. 05/19/11  Yes [provider]  cinacalcet (SENSIPAR) 90 MG tablet Take 90 mg by mouth daily.    Yes [provider]  clonazePAM (KLONOPIN) 0.5 MG tablet Take 0.5 mg by mouth at bedtime.   Yes [provider]  clopidogrel (PLAVIX) 75 MG tablet Take 1 tablet (75 mg total) by mouth daily. 12/03/15  Yes Dew, Erskine Squibb, MD  colchicine 0.6 MG tablet Take 0.6 mg by mouth daily as needed (for gout flares).    Yes [provider]  furosemide (LASIX) 40 MG tablet Take 40 mg by mouth See admin instructions. Take 40 mg once daily on non dialysis days Tues, Fri, Sat, and Sun   Yes [provider]  gabapentin (NEURONTIN) 100 MG capsule Take 200 mg by mouth 2 (two) times daily.   Yes [provider]  hydrALAZINE (APRESOLINE) 25 MG tablet Take 25 mg by mouth daily.    Yes [provider]  irbesartan (AVAPRO) 150 MG tablet Take 300 mg by mouth daily.    Yes [provider]  metoprolol succinate (TOPROL-XL) 50 MG 24 hr tablet Take 50 mg by mouth daily.    Yes [provider]  pregabalin (LYRICA) 50 MG capsule Take 50 mg by mouth daily. After each dialysis session   Yes [provider]  ramipril (ALTACE) 10 MG capsule Take 10 mg by mouth at bedtime.    Yes [provider]  sevelamer carbonate (RENVELA) 800 MG tablet Take 2,400 mg by mouth 3 (three) times daily with meals.    Yes [provider]  traMADol (ULTRAM) 50 MG tablet Take 1 tablet (50 mg total) by mouth every 6 (six) hours as needed. 11/18/17  Yes Loney Hering, MD  aspirin EC 81 MG tablet Take 1 tablet (81 mg total) by mouth daily. Patient not taking: Reported on 11/16/2017 12/03/15   Algernon Huxley, MD  atorvastatin (LIPITOR) 10 MG tablet Take 1 tablet  (10 mg total) by mouth daily. Patient not taking: Reported on 11/19/2017 11/16/17 11/16/18  Algernon Huxley, MD  HYDROcodone-acetaminophen (NORCO/VICODIN) 5-325 MG tablet Take 1 tablet by mouth every 4 (four) hours as needed. Patient not taking: Reported on 11/16/2017 10/15/17   Harvest Dark, MD  ibuprofen (ADVIL,MOTRIN) 600 MG tablet Take 600 mg by mouth. 02/28/13   [provider]  lidocaine-prilocaine (EMLA) cream Apply 1 application topically as needed (port access).    [provider]    Allergies Shellfish allergy  Family History  Problem Relation Age of Onset  . Hypertension Unknown   . Heart disease Unknown     Social History Social History   Tobacco Use  . Smoking status: Former Research scientist (life sciences)  . Smokeless tobacco: Never Used  Substance Use Topics  . Alcohol use: No  . Drug use: No    Review of Systems  Constitutional: No fever/chills Eyes: No visual changes. ENT: No sore throat. Cardiovascular: Positive for chest pain. Respiratory: Positive for shortness of breath. Gastrointestinal: No abdominal pain.  No nausea, no vomiting.  No diarrhea.  No constipation. Genitourinary: Negative for dysuria. Musculoskeletal: Negative for back pain. Skin: Negative for rash. Neurological: Negative for headaches, focal weakness or numbness.   ____________________________________________   PHYSICAL EXAM:  VITAL SIGNS: ED Triage Vitals  Enc Vitals Group     BP 11/19/17 0106 (!) 189/86     Pulse Rate 11/19/17 0106 (!) 101     Resp 11/19/17 0106 20     Temp 11/19/17 0108 100.1 F (37.8 C)     Temp Source 11/19/17 0108 Oral     SpO2 11/19/17 0106 100 %     Weight 11/19/17 0107 176 lb (79.8 kg)     Height 11/19/17 0107 5\' 8"  (1.727 m)     Head Circumference --      Peak Flow --      Pain Score 11/19/17 0107 8     Pain Loc --      Pain Edu? --      Excl. in [place]? --     Constitutional: Alert and oriented. Well appearing and in mild acute distress. Eyes:  Conjunctivae are normal. PERRL. EOMI. Head: Atraumatic. Nose: No congestion/rhinnorhea. Mouth/Throat: Mucous membranes are moist.  Oropharynx non-erythematous. Neck: No stridor.   Cardiovascular: Normal rate, regular rhythm.  2/6 systolic ejection murmur.  Good peripheral circulation. Respiratory: Normal respiratory effort.  No retractions. Lungs with faint bibasilar rales. Gastrointestinal: Soft and nontender. No distention. No abdominal bruits. No CVA tenderness. Musculoskeletal: No lower extremity tenderness nor edema.  No joint effusions.  Limbs are symmetrically warm in temperature.  Patient has a dopplerable and palpable PT pulse which is the same as he did last night in the ED and also in the vascular surgery office.  Chronic nonhealing left lateral foot ulcer. Neurologic:  Normal speech and language. No gross focal neurologic deficits are appreciated.  Skin:  Skin is warm, dry and intact. No rash noted. Psychiatric: Mood and affect are normal. Speech and behavior are normal.  ____________________________________________   LABS (all labs ordered are listed, but only abnormal results are displayed)  Labs Reviewed  CBC WITH DIFFERENTIAL/PLATELET - Abnormal; Notable for the following components:      Result Value   RBC 3.38 (*)    Hemoglobin 10.6 (*)    HCT 31.4 (*)    RDW 15.2 (*)    Neutro Abs 7.6 (*)    Lymphs Abs 0.6 (*)    All other components within normal limits  COMPREHENSIVE METABOLIC PANEL - Abnormal; Notable for the following components:   Chloride 96 (*)    Glucose, Bld 69 (*)    BUN 35 (*)    Creatinine, Ser 9.36 (*)    Calcium 8.8 (*)    GFR calc non Af Amer 5 (*)    GFR calc Af Amer 6 (*)    All other components within normal limits  TROPONIN I - Abnormal; Notable for the following components:   Troponin I 0.08 (*)    All other components within normal limits  LACTIC ACID, PLASMA - Abnormal; Notable for the following components:   Lactic Acid, Venous 2.0  (*)    All other components within normal limits  CULTURE, BLOOD (ROUTINE X 2)  CULTURE, BLOOD (ROUTINE X 2)  LACTIC ACID, PLASMA   ____________________________________________  EKG  ED ECG REPORT I, Mendi Constable J, the attending physician, personally viewed and interpreted this ECG.   Date: 11/19/2017  EKG Time: 0112  Rate: 96  Rhythm: normal EKG,  normal sinus rhythm  Axis: Normal  Intervals:none  ST&T Change: Nonspecific  ____________________________________________  RADIOLOGY  ED MD interpretation: No active cardiopulmonary disease; no osteomyelitis  Official radiology report(s): Dg Chest Port 1 View  Result Date: 11/19/2017 CLINICAL DATA:  Shortness of breath EXAM: PORTABLE CHEST 1 VIEW COMPARISON:  None. FINDINGS: The heart size and mediastinal contours are within normal limits. Both lungs are clear. The visualized skeletal structures are unremarkable. IMPRESSION: No active disease. Electronically Signed   By: Ulyses Jarred M.D.   On: 11/19/2017 02:39   Dg Foot Complete Left  Result Date: 11/19/2017 CLINICAL DATA:  Foot ulcer EXAM: LEFT FOOT - COMPLETE 3+ VIEW COMPARISON:  11/18/2017 FINDINGS: Unchanged soft tissue ulceration at the lateral aspect of the left hindfoot. No evidence of active osteomyelitis. IMPRESSION: Unchanged examination with soft tissue ulceration at the lateral left hindfoot. No evidence of active infectious osteitis. Electronically Signed   By: Ulyses Jarred M.D.   On: 11/19/2017 02:58   Dg Foot Complete Left  Result Date: 11/18/2017 CLINICAL DATA:  Left foot ulceration EXAM: LEFT FOOT - COMPLETE 3+ VIEW COMPARISON:  None. 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 Extensive vascular calcification. No acute fracture or dislocation. Small focus of soft tissue ulceration along the lateral surface of the foot, at approximately the level of the metatarsal shaft. IMPRESSION: 1. No acute abnormality of the left  foot. Small focus of lateral soft tissue ulceration. 2. Unchanged first metatarsophalangeal joint osteoarthrosis. Electronically Signed   By: Ulyses Jarred M.D.   On: 11/18/2017 05:36    ____________________________________________   PROCEDURES  Procedure(s) performed: None  Procedures  Critical Care performed:   CRITICAL CARE Performed by: Paulette Blanch   Total critical care time: 30 minutes  Critical care time was exclusive of separately billable procedures and treating other patients.  Critical care was necessary to treat or prevent imminent or life-threatening deterioration.  Critical care was time spent personally by me on the following activities: development of treatment plan with patient and/or surrogate as well as nursing, discussions with consultants, evaluation of patient's response to treatment, examination of patient, obtaining history from patient or surrogate, ordering and performing treatments and interventions, ordering and review of laboratory studies, ordering and review of radiographic studies, pulse oximetry and re-evaluation of patient's condition.  ____________________________________________   INITIAL IMPRESSION / ASSESSMENT AND PLAN / ED COURSE  As part of my medical decision making, I reviewed the following data within the [facility] notes reviewed and incorporated, Labs reviewed, EKG interpreted, Old chart reviewed, Radiograph reviewed, Discussed with admitting physician and Notes from prior ED visits   67 year old male with ESRD, hypertension, PAD who presents with chest pain and shortness of breath. Differential diagnosis includes, but is not limited to, ACS, aortic dissection, pulmonary embolism, cardiac tamponade, pneumothorax, pneumonia, pericarditis, myocarditis, GI-related causes including esophagitis/gastritis, and musculoskeletal chest wall pain.    Patient is chest pain-free after IV morphine and nitroglycerin paste.   Although patient's troponin is chronically elevated most likely secondary to his renal failure, it is more elevated tonight compared to his baseline.  Low-grade temperature without fever; lactic acid is 2.  Will discuss with hospitalist to evaluate patient in the emergency department for admission.  Clinical Course as of Nov 19 498  Sun Nov 19, 2017  0500 Repeat lactate normalized   [JS]    Clinical Course User Index [JS] Paulette Blanch, MD     ____________________________________________   FINAL CLINICAL  IMPRESSION(S) / ED DIAGNOSES  Final diagnoses:  Chest pain, unspecified type  PAD (peripheral artery disease) ([place])  Stage 5 chronic kidney disease on chronic dialysis [facility])     ED Discharge Orders    None       Note:  This document was prepared using Dragon voice recognition software and may include unintentional dictation errors.    Paulette Blanch, MD 11/19/17 0500

## 2017-11-19 NOTE — ED Notes (Signed)
Ulceration noted to left lateral foot unchanged from assessment on 11/18/2017.

## 2017-11-19 NOTE — Consult Note (Signed)
Reason for Consult: Chest pain angina Referring Physician: Dr. Rosilyn Mings hospitalist Cardiologist Dr. Roderic Scarce Johnny [name]. is an 67 y.o. male.  HPI: Patient is a 67 year old black male with end-stage renal disease peripheral vascular disease nonhealing left leg ulcer recent peripheral vascular disease intervention to help with circulation patient has history of mild cardiomyopathy with congestive heart failure with mild systolic dysfunction patient is on dialysis for end-stage renal disease has hyperlipidemia and hypertension states that over the past few days he started having chest discomfort shortness of breath tightness dyspnea on exertion with minimal activity so finally came to the emergency room for further assessment and was advised to be admitted.  Patient still has some mild dyspnea chest pain is improved no leg swelling.  Denies previous cardiac catheterization patient states symptoms are somewhat better after taking nitroglycerin paste  Past Medical History:  Diagnosis Date  . Anemia   . CHF (congestive heart failure) ([place])   . Chronic kidney disease   . Gout   . Hyperlipidemia   . Hypertension     Past Surgical History:  Procedure Laterality Date  . A/V SHUNTOGRAM Left 06/21/2017   Procedure: A/V SHUNTOGRAM;  Surgeon: Katha Cabal, MD;  Location: [place] [facility];  Service: Cardiovascular;  Laterality: Left;  . AV FISTULA PLACEMENT Left 09/18/2015   Procedure: INSERTION OF ARTERIOVENOUS (AV) GORE-TEX GRAFT ARM ( BRACH/AXILLARY GRAFT W/ INSTANT STICK GRAFT );  Surgeon: Katha Cabal, MD;  Location: [facility];  Service: Vascular;  Laterality: Left;  . DIALYSIS FISTULA CREATION    . LOWER EXTREMITY ANGIOGRAPHY Left 11/16/2017   Procedure: LOWER EXTREMITY ANGIOGRAPHY;  Surgeon: Algernon Huxley, MD;  Location: [place] [facility];  Service: Cardiovascular;  Laterality: Left;  . PERIPHERAL VASCULAR CATHETERIZATION Left 09/01/2015   Procedure: A/V  Shuntogram/Fistulagram;  Surgeon: Katha Cabal, MD;  Location: [place] [facility];  Service: Cardiovascular;  Laterality: Left;  . PERIPHERAL VASCULAR CATHETERIZATION N/A 09/30/2015   Procedure: A/V Shuntogram/Fistulagram with perm cathether removal;  Surgeon: Algernon Huxley, MD;  Location: [place] [facility];  Service: Cardiovascular;  Laterality: N/A;  . PERIPHERAL VASCULAR CATHETERIZATION Left 09/30/2015   Procedure: A/V Shunt Intervention;  Surgeon: Algernon Huxley, MD;  Location: [place];  Service: Cardiovascular;  Laterality: Left;  . PERIPHERAL VASCULAR CATHETERIZATION Left 12/03/2015   Procedure: Thrombectomy;  Surgeon: Algernon Huxley, MD;  Location: [place] [facility];  Service: Cardiovascular;  Laterality: Left;  . PERIPHERAL VASCULAR CATHETERIZATION Left 01/28/2016   Procedure: Thrombectomy;  Surgeon: Algernon Huxley, MD;  Location: [place] [facility];  Service: Cardiovascular;  Laterality: Left;  . PERIPHERAL VASCULAR CATHETERIZATION N/A 01/28/2016   Procedure: A/V Shuntogram/Fistulagram;  Surgeon: Algernon Huxley, MD;  Location: [place] [facility];  Service: Cardiovascular;  Laterality: N/A;    Family History  Problem Relation Age of Onset  . Hypertension Unknown   . Heart disease Unknown     Social History:  reports that he has quit smoking. He has never used smokeless tobacco. He reports that he does not drink alcohol or use drugs.  Allergies:  Allergies  Allergen Reactions  . Shellfish Allergy Anaphylaxis    Medications: I have reviewed the patient's current medications.  Results for orders placed or performed during the hospital encounter of 11/19/17 (from the past 48 hour(s))  CBC with Differential     Status: Abnormal   Collection Time: 11/19/17  1:46 AM  Result Value Ref Range  WBC 9.3 3.8 - 10.6 K/uL   RBC 3.38 (L) 4.40 - 5.90 MIL/uL   Hemoglobin 10.6 (L) 13.0 - 18.0 g/dL   HCT 31.4 (L) 40.0 - 52.0 %   MCV 92.9 80.0 - 100.0 fL   MCH 31.5 26.0 - 34.0 pg    MCHC 33.8 32.0 - 36.0 g/dL   RDW 15.2 (H) 11.5 - 14.5 %   Platelets 169 150 - 440 K/uL   Neutrophils Relative % 81 %   Neutro Abs 7.6 (H) 1.4 - 6.5 K/uL   Lymphocytes Relative 7 %   Lymphs Abs 0.6 (L) 1.0 - 3.6 K/uL   Monocytes Relative 11 %   Monocytes Absolute 1.0 0.2 - 1.0 K/uL   Eosinophils Relative 1 %   Eosinophils Absolute 0.1 0 - 0.7 K/uL   Basophils Relative 0 %   Basophils Absolute 0.0 0 - 0.1 K/uL    Comment: Performed at [facility], [address] [place], [place] [address]  Comprehensive metabolic panel     Status: Abnormal   Collection Time: 11/19/17  1:46 AM  Result Value Ref Range   Sodium 137 135 - 145 mmol/L   Potassium 4.3 3.5 - 5.1 mmol/L   Chloride 96 (L) 98 - 111 mmol/L    Comment: Please note change in reference range.   CO2 26 22 - 32 mmol/L   Glucose, Bld 69 (L) 70 - 99 mg/dL    Comment: Please note change in reference range.   BUN 35 (H) 8 - 23 mg/dL    Comment: Please note change in reference range.   Creatinine, Ser 9.36 (H) 0.61 - 1.24 mg/dL   Calcium 8.8 (L) 8.9 - 10.3 mg/dL   Total Protein 7.6 6.5 - 8.1 g/dL   Albumin 3.7 3.5 - 5.0 g/dL   AST 19 15 - 41 U/L   ALT <5 0 - 44 U/L    Comment: Please note change in reference range.   Alkaline Phosphatase 46 38 - 126 U/L   Total Bilirubin 0.6 0.3 - 1.2 mg/dL   GFR calc non Af Amer 5 (L) >60 mL/min   GFR calc Af Amer 6 (L) >60 mL/min    Comment: (NOTE) The eGFR has been calculated using the CKD EPI equation. This calculation has not been validated in all clinical situations. eGFR's persistently <60 mL/min signify possible Chronic Kidney Disease.    Anion gap 15 5 - 15    Comment: Performed at [facility], [address] [place], [place] [address]  Troponin I     Status: Abnormal   Collection Time: 11/19/17  1:46 AM  Result Value Ref Range   Troponin I 0.08 (HH) <0.03 ng/mL    Comment: CRITICAL RESULT CALLED TO, READ BACK BY AND VERIFIED WITH APRIL BRUMGARD AT 0219  11/19/17.PMH Performed at [facility], [address] [place], [place] [address]   Culture, blood (routine x 2)     Status: None (Preliminary result)   Collection Time: 11/19/17  1:46 AM  Result Value Ref Range   Specimen Description BLOOD RIGHT HAND    Special Requests      BOTTLES DRAWN AEROBIC AND ANAEROBIC Blood Culture adequate volume   Culture      NO GROWTH < 12 HOURS Performed at [facility], [address], [place], [place] [address]    Report Status PENDING   Lactic acid, plasma     Status: Abnormal   Collection Time: 11/19/17  1:46 AM  Result Value Ref Range   Lactic Acid, Venous 2.0 (HH) 0.5 - 1.9 mmol/L    Comment: CRITICAL RESULT CALLED TO, READ BACK BY AND VERIFIED WITH APRIL BRUMGARD AT 0255 11/19/17.PMH Performed at [facility], [address] [place], [place] [address]   Culture, blood (routine x 2)     Status: None (Preliminary result)   Collection Time: 11/19/17  3:24 AM  Result Value Ref Range   Specimen Description BLOOD BLOOD RIGHT HAND    Special Requests      BOTTLES DRAWN AEROBIC AND ANAEROBIC Blood Culture adequate volume   Culture      NO GROWTH < 12 HOURS Performed at [facility], [address] [place], [place] [address]    Report Status PENDING   Lactic acid, plasma     Status: None   Collection Time: 11/19/17  3:24 AM  Result Value Ref Range   Lactic Acid, Venous 1.1 0.5 - 1.9 mmol/L    Comment: Performed at [facility], [address] [place], [place] [address]  TSH     Status: None   Collection Time: 11/19/17  5:28 AM  Result Value Ref Range   TSH 1.466 0.350 - 4.500 uIU/mL    Comment: Performed by a 3rd Generation assay with a functional sensitivity of <=0.01 uIU/mL. Performed at [facility], [address] [place], [place] [address]   Troponin I     Status: Abnormal   Collection Time: 11/19/17  5:28 AM  Result Value Ref Range   Troponin I 0.08 (HH) <0.03 ng/mL     Comment: CRITICAL VALUE NOTED. VALUE IS CONSISTENT WITH PREVIOUSLY REPORTED/CALLED VALUE SNJ Performed at [facility], [address] [place], [place] [address]   Troponin I     Status: Abnormal   Collection Time: 11/19/17 11:13 AM  Result Value Ref Range   Troponin I 0.09 (HH) <0.03 ng/mL    Comment: CRITICAL VALUE NOTED. VALUE IS CONSISTENT WITH PREVIOUSLY REPORTED/CALLED VALUE SNJ Performed at [facility], [place], [place] [address]     [name] Lower Ext Art Left  Result Date: 11/18/2017 CLINICAL DATA:  67 year old male with left leg pain and swelling. Recent left distal SFA stent placement on 11/16/2017. Evaluate for stent patency. EXAM: Left LOWER EXTREMITY ARTERIAL DUPLEX SCAN TECHNIQUE: Limited Gray-scale sonography as well as color Doppler and duplex ultrasound was performed to evaluate the lower extremity arterial stent . COMPARISON:  None. FINDINGS: Limited evaluation of the left lower extremity arteries in the region of the stent demonstrate patency of the stent and vascular segment immediately proximal and distal to the stent. The peak systolic velocity within the stent is in the range of 74-115 centimeter/second. There is no parvus tardus. IMPRESSION: Patent appearing stent on limited exam. Electronically Signed   By: Anner Crete M.D.   On: 11/18/2017 03:50   Dg Chest Port 1 View  Result Date: 11/19/2017 CLINICAL DATA:  Shortness of breath EXAM: PORTABLE CHEST 1 VIEW COMPARISON:  None. FINDINGS: The heart size and mediastinal contours are within normal limits. Both lungs are clear. The visualized skeletal structures are unremarkable. IMPRESSION: No active disease. Electronically Signed   By: Ulyses Jarred M.D.   On: 11/19/2017 02:39   Dg Foot Complete Left  Result Date: 11/19/2017 CLINICAL DATA:  Foot ulcer EXAM: LEFT FOOT - COMPLETE 3+ VIEW COMPARISON:  11/18/2017 FINDINGS: Unchanged soft tissue ulceration at the lateral aspect of the left  hindfoot. No evidence of  active osteomyelitis. IMPRESSION: Unchanged examination with soft tissue ulceration at the lateral left hindfoot. No evidence of active infectious osteitis. Electronically Signed   By: Ulyses Jarred M.D.   On: 11/19/2017 02:58   Dg Foot Complete Left  Result Date: 11/18/2017 CLINICAL DATA:  Left foot ulceration EXAM: LEFT FOOT - COMPLETE 3+ VIEW COMPARISON:  None. 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 Extensive vascular calcification. No acute fracture or dislocation. Small focus of soft tissue ulceration along the lateral surface of the foot, at approximately the level of the metatarsal shaft. IMPRESSION: 1. No acute abnormality of the left foot. Small focus of lateral soft tissue ulceration. 2. Unchanged first metatarsophalangeal joint osteoarthrosis. Electronically Signed   By: Ulyses Jarred M.D.   On: 11/18/2017 05:36    Review of Systems  Constitutional: Positive for diaphoresis, malaise/fatigue and weight loss.  HENT: Positive for congestion.   Eyes: Negative.   Respiratory: Positive for shortness of breath.   Cardiovascular: Positive for chest pain and claudication.  Gastrointestinal: Negative.   Genitourinary: Negative.   Musculoskeletal: Positive for myalgias.  Skin: Negative.   Neurological: Positive for weakness.  Endo/Heme/Allergies: Negative.   Psychiatric/Behavioral: Negative.    Blood pressure (!) 145/77, pulse 94, temperature 98.2 F (36.8 C), temperature source Oral, resp. rate 18, height '5\' 8"'  (1.727 m), weight 176 lb (79.8 kg), SpO2 100 %. Physical Exam  Nursing note and vitals reviewed. Constitutional: He is oriented to person, place, and time. He appears well-developed and well-nourished.  HENT:  Head: Normocephalic and atraumatic.  Eyes: Pupils are equal, round, and reactive to light. Conjunctivae and EOM are normal.  Neck: Neck supple.  Cardiovascular: Normal rate, regular rhythm and  normal heart sounds.  Respiratory: Effort normal and breath sounds normal.  GI: Soft. Bowel sounds are normal. There is tenderness.  Musculoskeletal: Normal range of motion.  Neurological: He is alert and oriented to person, place, and time. He has normal reflexes.  Skin: Skin is warm and dry.  Psychiatric: He has a normal mood and affect.    Assessment/Plan: Unstable angina Shortness of breath Peripheral vascular disease Hypertension End-stage renal disease Congestive heart failure systolic Gout Hyperlipidemia Nonhealing left foot ulcer . Plan Agree to admit the telemetry Rule out for myocardial infarction Follow-up troponins EKGs Consider echocardiogram for assessment of LV function Continue blood pressure control Continue hypertension management Recommend continue dialysis treatment Consider functional study possibly Lexiscan Myoview in the morning  Johnny [name] 11/19/2017, 11:48 AM

## 2017-11-19 NOTE — ED Notes (Signed)
Critical troponin of 0.08 called from lab. Dr. Beather Arbour notified.

## 2017-11-19 NOTE — ED Notes (Signed)
Lab here to draw am labs.

## 2017-11-19 NOTE — Progress Notes (Signed)
Pt with medications in pharmacy

## 2017-11-19 NOTE — ED Triage Notes (Signed)
Pt complains of shob. Pt states he stood up at midnight and became shob. Pt also complains of left leg pain and states "that leg gave out on me". Pt was here last pm for left leg pain post leg stent placement for arterial occlusion.

## 2017-11-19 NOTE — ED Notes (Signed)
Pt with ra pox of 88% while sleeping. Pt placed on 2lpm via [place].

## 2017-11-19 NOTE — Plan of Care (Signed)
  Problem: Education: Goal: Knowledge of General Education information will improve Outcome: Progressing   Problem: Clinical Measurements: Goal: Will remain free from infection Outcome: Progressing Goal: Respiratory complications will improve Outcome: Progressing   Problem: Activity: Goal: Risk for activity intolerance will decrease Outcome: Progressing   Problem: Pain Managment: Goal: General experience of comfort will improve Outcome: Progressing   Problem: Safety: Goal: Ability to remain free from injury will improve Outcome: Progressing

## 2017-11-19 NOTE — Care Management Note (Signed)
Case Management Note  Patient Details  Name: Johnny [name]. MRN: [id number] Date of Birth: Jan 25, 1951  Subjective/Objective:  Patient admitted to [facility] under observation status for chest pain. RNCM consulted on patient to provide MOON letter and complete assessment. Patient tells me he currently lives in an apartment community that provides certain services such as transport and assisting with grocery shopping, etc. They however do not provide in home services or physical care. He names it "assisted living" but it is not an organization or company it is just an apartment complex with some additional benefits, none the less, the patient lives alone but does receive help from his neighbor Johnny [name] [phone]. The apartment complex has a Lucianne Lei that provides him with transportation to appointments and dialysis. He is on HD M,W,F. Reports and increase in weakness due to poor sensation in his lower extremities from re occurring vascular issues. PCP is Johnny [name] and he uses the [facility] [facility] and obtains medications without concern.                   Action/Plan: Will notify HD liaison of admission. Patient would like a walker if possible at discharge, will leave sticky note for attending MD. On acute oxygen, primary RN will attempt to wean.  Expected Discharge Date:                  Expected Discharge Plan:     In-House Referral:     Discharge planning Services     Post Acute Care Choice:    Choice offered to:     DME Arranged:    DME Agency:     HH Arranged:    HH Agency:     Status of Service:     If discussed at [employer], dates discussed:    Additional Comments:  Latanya Maudlin, RN 11/19/2017, 8:19 AM

## 2017-11-20 ENCOUNTER — Observation Stay: Payer: Medicare Other

## 2017-11-20 ENCOUNTER — Encounter: Payer: Self-pay | Admitting: *Deleted

## 2017-11-20 ENCOUNTER — Ambulatory Visit: Payer: Medicare Other

## 2017-11-20 LAB — NM MYOCAR MULTI W/SPECT W/WALL MOTION / EF
CHL CUP MPHR: 152 {beats}/min
CHL CUP NUCLEAR SDS: 1
CHL CUP NUCLEAR SRS: 2
CHL CUP NUCLEAR SSS: 3
CSEPED: 1 min
Estimated workload: 1 METS
Exercise duration (sec): 0 s
LV dias vol: 413 mL (ref 62–150)
LV sys vol: 63 mL
Peak HR: 90 {beats}/min
Percent HR: 59 %
Rest HR: 77 {beats}/min
TID: 1.01

## 2017-11-20 LAB — MRSA PCR SCREENING: MRSA by PCR: NEGATIVE

## 2017-11-20 MED ORDER — TECHNETIUM TC 99M TETROFOSMIN IV KIT
30.1000 | PACK | Freq: Once | INTRAVENOUS | Status: AC | PRN
  Administered 2017-11-20: 30.1 via INTRAVENOUS

## 2017-11-20 MED ORDER — TECHNETIUM TC 99M TETROFOSMIN IV KIT
13.0900 | PACK | Freq: Once | INTRAVENOUS | Status: AC | PRN
  Administered 2017-11-20: 13.09 via INTRAVENOUS

## 2017-11-20 MED ORDER — REGADENOSON 0.4 MG/5ML IV SOLN
0.4000 mg | Freq: Once | INTRAVENOUS | Status: AC
  Administered 2017-11-20: 0.4 mg via INTRAVENOUS

## 2017-11-20 NOTE — Progress Notes (Signed)
[facility]  ROUNDING NOTE   Subjective:   Seen and examined on hemodialysis. Patient with decreased blood flows and thrombotic lines.    HEMODIALYSIS FLOWSHEET:  Blood Flow Rate (mL/min): 300 mL/min Arterial Pressure (mmHg): -130 mmHg Venous Pressure (mmHg): 120 mmHg Transmembrane Pressure (mmHg): 80 mmHg Ultrafiltration Rate (mL/min): 1000 mL/min Dialysate Flow Rate (mL/min): 800 ml/min Conductivity: Machine : 13.8 Conductivity: Machine : 13.8 Dialysis Fluid Bolus: Normal Saline Bolus Amount (mL): 250 mL     Objective:  Vital signs in last 24 hours:  Temp:  [97.8 F (36.6 C)-98.6 F (37 C)] 97.8 F (36.6 C) (07/15 1330) Pulse Rate:  [74-87] 86 (07/15 1330) Resp:  [13-22] 22 (07/15 1445) BP: (105-166)/(59-90) 110/90 (07/15 1445) SpO2:  [96 %-100 %] 100 % (07/15 1330) Weight:  [74.9 kg (165 lb 1.6 oz)-74.9 kg (165 lb 2 oz)] 74.9 kg (165 lb 2 oz) (07/15 1330)  Weight change: -4.944 kg (-10 lb 14.4 oz) Filed Weights   11/19/17 0107 11/20/17 0328 11/20/17 1330  Weight: 79.8 kg (176 lb) 74.9 kg (165 lb 1.6 oz) 74.9 kg (165 lb 2 oz)    Intake/Output: I/O last 3 completed shifts: In: 360 [P.O.:360] Out: 3 [Emesis/NG output:3]   Intake/Output this shift:  Total I/O In: 240 [P.O.:240] Out: -   Physical Exam: General: No acute distress  Head: Normocephalic, atraumatic. Moist oral mucosal membranes  Eyes: Anicteric  Neck: Supple, trachea midline  Lungs:  Clear to auscultation, normal effort  Heart: S1S2 no rubs  Abdomen:  Soft, nontender, bowel sounds present  Extremities: No peripheral edema.  Neurologic: Awake, alert, following commands  Skin: Ulceration in left foot  Access: LUE AVG    Basic Metabolic Panel: Recent Labs  Lab 11/18/17 0016 11/19/17 0146  NA 138 137  K 4.5 4.3  CL 97* 96*  CO2 28 26  GLUCOSE 86 69*  BUN 22 35*  CREATININE 6.32* 9.36*  CALCIUM 8.9 8.8*    Liver Function Tests: Recent Labs  Lab 11/19/17 0146  AST  19  ALT <5  ALKPHOS 46  BILITOT 0.6  PROT 7.6  ALBUMIN 3.7   No results for input(s): LIPASE, AMYLASE in the last 168 hours. No results for input(s): AMMONIA in the last 168 hours.  CBC: Recent Labs  Lab 11/18/17 0056 11/19/17 0146  WBC 9.3 9.3  NEUTROABS  --  7.6*  HGB 10.1* 10.6*  HCT 30.0* 31.4*  MCV 92.3 92.9  PLT 155 169    Cardiac Enzymes: Recent Labs  Lab 11/19/17 0146 11/19/17 0528 11/19/17 1113 11/19/17 1945  TROPONINI 0.08* 0.08* 0.09* 0.08*    BNP: Invalid input(s): POCBNP  CBG: No results for input(s): GLUCAP in the last 168 hours.  Microbiology: Results for orders placed or performed during the hospital encounter of 11/19/17  Culture, blood (routine x 2)     Status: None (Preliminary result)   Collection Time: 11/19/17  1:46 AM  Result Value Ref Range Status   Specimen Description BLOOD RIGHT HAND  Final   Special Requests   Final    BOTTLES DRAWN AEROBIC AND ANAEROBIC Blood Culture adequate volume   Culture   Final    NO GROWTH 1 DAY Performed at [facility], [address] [place], [place] [address]    Report Status PENDING  Incomplete  Culture, blood (routine x 2)     Status: None (Preliminary result)   Collection Time: 11/19/17  3:24 AM  Result Value Ref Range Status   Specimen  Description BLOOD BLOOD RIGHT HAND  Final   Special Requests   Final    BOTTLES DRAWN AEROBIC AND ANAEROBIC Blood Culture adequate volume   Culture   Final    NO GROWTH 1 DAY Performed at [facility], [address] [place], [place] [address]    Report Status PENDING  Incomplete  MRSA PCR Screening     Status: None   Collection Time: 11/20/17  3:32 AM  Result Value Ref Range Status   MRSA by PCR NEGATIVE NEGATIVE Final    Comment:        The GeneXpert MRSA Assay (FDA approved for NASAL specimens only), is one component of a comprehensive MRSA colonization surveillance program. It is not intended to diagnose MRSA infection nor to  guide or monitor treatment for MRSA infections. Performed at [facility], [address] [place],  [address]     Coagulation Studies: No results for input(s): LABPROT, INR in the last 72 hours.  Urinalysis: No results for input(s): COLORURINE, LABSPEC, PHURINE, GLUCOSEU, HGBUR, BILIRUBINUR, KETONESUR, PROTEINUR, UROBILINOGEN, NITRITE, LEUKOCYTESUR in the last 72 hours.  Invalid input(s): APPERANCEUR    Imaging: Nm Myocar Multi W/spect W/wall Motion / Ef  Result Date: 11/20/2017  The study is normal.  This is a low risk study.  The left ventricular ejection fraction is normal (55-65%).  Blood pressure demonstrated a normal response to exercise.  There was no ST segment deviation noted during stress.    Dg Chest Port 1 View  Result Date: 11/19/2017 CLINICAL DATA:  Shortness of breath EXAM: PORTABLE CHEST 1 VIEW COMPARISON:  None. FINDINGS: The heart size and mediastinal contours are within normal limits. Both lungs are clear. The visualized skeletal structures are unremarkable. IMPRESSION: No active disease. Electronically Signed   By: Ulyses Jarred M.D.   On: 11/19/2017 02:39   Dg Foot Complete Left  Result Date: 11/19/2017 CLINICAL DATA:  Foot ulcer EXAM: LEFT FOOT - COMPLETE 3+ VIEW COMPARISON:  11/18/2017 FINDINGS: Unchanged soft tissue ulceration at the lateral aspect of the left hindfoot. No evidence of active osteomyelitis. IMPRESSION: Unchanged examination with soft tissue ulceration at the lateral left hindfoot. No evidence of active infectious osteitis. Electronically Signed   By: Ulyses Jarred M.D.   On: 11/19/2017 02:58     Medications:    . allopurinol  100 mg Oral Daily  . amLODipine  10 mg Oral Daily  . calcium acetate  1,334 mg Oral TID WC  . cinacalcet  90 mg Oral Q breakfast  . clonazePAM  0.5 mg Oral QHS  . clopidogrel  75 mg Oral Daily  . docusate sodium  100 mg Oral BID  . furosemide  40 mg Oral Once per day on Sun Tue Thu Sat  .  gabapentin  200 mg Oral BID  . heparin  5,000 Units Subcutaneous Q8H  . hydrALAZINE  25 mg Oral Daily  . irbesartan  300 mg Oral Daily  . metoprolol succinate  50 mg Oral Daily  . multivitamin  1 tablet Oral Daily  . pregabalin  50 mg Oral Daily  . sevelamer carbonate  2,400 mg Oral TID WC   acetaminophen **OR** acetaminophen, colchicine, lidocaine-prilocaine, morphine injection, ondansetron **OR** ondansetron (ZOFRAN) IV, traMADol  Assessment/ Plan:  67 y.o. black male  with end stage renal disease on hemodialysis, hypertension, hyperlipidemia, congestive heart failure, peripheral vascular disease, gout  [facility]/MWF/LUE AVG/EDW 76.5  1.  ESRD on HD MWF.  Seen and examined on hemodialysis.  Issues with blood lines. Will continue treatment until system clots. Patient to get at least 2 hours of treatment.   2.  Anemia of chronic kidney disease.  Hemoglobin 10.6. - EPO as outpatient  3.  Secondary hyperparathyroidism - Continue cinacalcet - Continue calcium acetate and sevelamer with meals.   4.  Hypertension.   - Continue amlodipine, hydralazine, irbesartan, and metoprolol.   LOS: 0 Johnny [name] 7/15/20193:48 PM

## 2017-11-20 NOTE — Progress Notes (Signed)
Home meds returned to pt.

## 2017-11-20 NOTE — Progress Notes (Signed)
Post HD  

## 2017-11-20 NOTE — Care Management (Signed)
Spoke with Dr. Verdell Carmine. States that he will be discharging Mr. Bosket to home today. Patient requested a wheelchair. Leroy Sea, [place] representative updated. Shelbie Ammons RN MSN CCM Care Management [phone]

## 2017-11-20 NOTE — Progress Notes (Signed)
Patient suffers from Severe PVD with ulceration and poor blood flow to his LE which impairs their ability to perform daily activities like dressing, grooming and toileting in the home.  A walker will not resolve  issue with performing activities of daily living. A wheelchair will allow patient to safely perform daily activities. Patient can safely propel the wheelchair in the home or has a caregiver who can provide assistance.  Accessories: elevating leg rests (ELRs), wheel locks, extensions and anti-tippers.

## 2017-11-20 NOTE — Progress Notes (Signed)
HD tx ended 

## 2017-11-20 NOTE — Progress Notes (Signed)
Discharge paperwork gone over with pt, no changes to medication, pt has follow up appointment tomorrow, made pt aware.  IV out, telemetry off. Healthcare power of attorney, here to take pt home. Pt pushed out in personal Wheelchair. All questions answered.   Conley Simmonds, RN, BSN

## 2017-11-20 NOTE — Progress Notes (Signed)
Patient signed consents this morning for hemodialysis, Lexiscan, and stress test.

## 2017-11-20 NOTE — Progress Notes (Signed)
HD tx started  

## 2017-11-21 ENCOUNTER — Ambulatory Visit
Admission: RE | Admit: 2017-11-21 | Discharge: 2017-11-21 | Disposition: A | Discharge status: Home / Self Care | Payer: Medicare Other | Source: Ambulatory Visit | Attending: Vascular Surgery | Admitting: Vascular Surgery

## 2017-11-21 DIAGNOSIS — I70212 Atherosclerosis of native arteries of extremities with intermittent claudication, left leg: Secondary | ICD-10-CM | POA: Diagnosis present

## 2017-11-21 NOTE — Discharge Summary (Signed)
[facility] NAME: Johnny [name]    MR#:  [id number]  DATE OF BIRTH:  1950/06/01  DATE OF ADMISSION:  11/19/2017 ADMITTING PHYSICIAN: Harrie Foreman, MD  DATE OF DISCHARGE: 11/20/2017  9:04 PM  PRIMARY CARE PHYSICIAN: [name], MD    ADMISSION DIAGNOSIS:  SOB (shortness of breath) [R06.02] PAD (peripheral artery disease) (HCC) [I73.9] Chest pain, unspecified type [R07.9] Stage 5 chronic kidney disease on chronic dialysis ([place]) [N18.6, Z99.2]  DISCHARGE DIAGNOSIS:  Active Problems:   Chest pain   SECONDARY DIAGNOSIS:   Past Medical History:  Diagnosis Date  . Anemia   . CHF (congestive heart failure) ([place])   . Chronic kidney disease   . Gout   . Hyperlipidemia   . Hypertension     HOSPITAL COURSE:   67 year old black male with end-stage renal disease peripheral vascular disease nonhealing left leg ulcer recent peripheral vascular disease intervention to help with circulation patient has history of mild cardiomyopathy with congestive heart failure, dialysis for end-stage renal disease has hyperlipidemia and hypertension states that over the past few days he started having chest discomfort shortness of breath tightness dyspnea on exertion with minimal activity.   1. Chest pain: Elevated troponin and typical anginal symptoms. -Patient was observed overnight on telemetry, his troponins did not trend upwards.  He was seen by cardiology who recommended a nuclear medicine stress test.  Patient underwent stress test which showed no evidence of acute ischemia with normal ejection fraction.  Patient is therefore discharged home with follow-up as an outpatient.  He will continue his Plavix, statin, Avapro.    2. ESRD: Patient normally gets dialysis on Monday Wednesday Friday. - He received his dialysis on his schedule as mentioned above.  3.  History of secondary hyperparathyroidism-patient will resume his Sensipar,  PhosLo.  3. PAD: Status post femoral stenting earlier last week.  -Patient will continue his Plavix, statin.  He was complaining of some left leg pain and discussed with vascular surgery which is typical for post reperfusion pain.  Patient is to follow-up with vascular surgery as an outpatient.  4. CHF: Chronic; diastolic. Continue amlodipine, metoprolol and ARB.  He will cont.Lasix per home regimen.   5. Gout: no acute attach and he will continue allopurinol    DISCHARGE CONDITIONS:   stable  CONSULTS OBTAINED:  Treatment Team:  Yolonda Kida, MD Anthonette Legato, MD  DRUG ALLERGIES:   Allergies  Allergen Reactions  . Shellfish Allergy Anaphylaxis    DISCHARGE MEDICATIONS:   Allergies as of 11/20/2017      Reactions   Shellfish Allergy Anaphylaxis      Medication List    TAKE these medications   allopurinol 100 MG tablet Commonly known as:  ZYLOPRIM Take 100 mg by mouth daily.   amLODipine 10 MG tablet Commonly known as:  NORVASC Take 10 mg by mouth.   calcium acetate 667 MG capsule Commonly known as:  PHOSLO Take 1,334 mg by mouth 3 (three) times daily with meals.   Calcium Gluconate 50 MG Tabs Take 50 mg by mouth.   cinacalcet 90 MG tablet Commonly known as:  SENSIPAR Take 90 mg by mouth daily.   clonazePAM 0.5 MG tablet Commonly known as:  KLONOPIN Take 0.5 mg by mouth at bedtime.   clopidogrel 75 MG tablet Commonly known as:  PLAVIX Take 1 tablet (75 mg total) by mouth daily.   colchicine 0.6 MG tablet Take 0.6 mg by  mouth daily as needed (for gout flares).   DIALYVITE 800/IRON PO Take by mouth.   furosemide 40 MG tablet Commonly known as:  LASIX Take 40 mg by mouth See admin instructions. Take 40 mg once daily on non dialysis days Tues, Fri, Sat, and Sun   gabapentin 100 MG capsule Commonly known as:  NEURONTIN Take 200 mg by mouth 2 (two) times daily.   hydrALAZINE 25 MG tablet Commonly known as:  APRESOLINE Take 25 mg  by mouth daily.   ibuprofen 600 MG tablet Commonly known as:  ADVIL,MOTRIN Take 600 mg by mouth.   irbesartan 150 MG tablet Commonly known as:  AVAPRO Take 300 mg by mouth daily.   lidocaine-prilocaine cream Commonly known as:  EMLA Apply 1 application topically as needed (port access).   metoprolol succinate 50 MG 24 hr tablet Commonly known as:  TOPROL-XL Take 50 mg by mouth daily.   pregabalin 50 MG capsule Commonly known as:  LYRICA Take 50 mg by mouth daily. After each dialysis session   sevelamer carbonate 800 MG tablet Commonly known as:  RENVELA Take 2,400 mg by mouth 3 (three) times daily with meals.   traMADol 50 MG tablet Commonly known as:  ULTRAM Take 1 tablet (50 mg total) by mouth every 6 (six) hours as needed.         DISCHARGE INSTRUCTIONS:   DIET:  Cardiac diet and Diabetic diet  DISCHARGE CONDITION:  Good  ACTIVITY:  Activity as tolerated  OXYGEN:  Home Oxygen: No.   Oxygen Delivery: room air  DISCHARGE LOCATION:  home   If you experience worsening of your admission symptoms, develop shortness of breath, life threatening emergency, suicidal or homicidal thoughts you must seek medical attention immediately by calling 911 or calling your MD immediately  if symptoms less severe.  You Must read complete instructions/literature along with all the possible adverse reactions/side effects for all the Medicines you take and that have been prescribed to you. Take any new Medicines after you have completely understood and accpet all the possible adverse reactions/side effects.   Please note  You were cared for by a hospitalist during your hospital stay. If you have any questions about your discharge medications or the care you received while you were in the hospital after you are discharged, you can call the unit and asked to speak with the hospitalist on call if the hospitalist that took care of you is not available. Once you are discharged, your  primary care physician will handle any further medical issues. Please note that NO REFILLS for any discharge medications will be authorized once you are discharged, as it is imperative that you return to your primary care physician (or establish a relationship with a primary care physician if you do not have one) for your aftercare needs so that they can reassess your need for medications and monitor your lab values.     Today   Still complaining of left leg pain.  No chest pain.  Will d/c home after HD today.   VITAL SIGNS:  Blood pressure 114/60, pulse 67, temperature 98.4 F (36.9 C), temperature source Oral, resp. rate 18, height 5\' 8"  (1.727 m), weight 74.1 kg (163 lb 5.8 oz), SpO2 98 %.  I/O:  No intake or output data in the 24 hours ending 11/21/17 1604  PHYSICAL EXAMINATION:   GENERAL:  67 y.o.-year-old patient lying in the bed with no acute distress.  EYES: Pupils equal, round, reactive to light and accommodation.  No scleral icterus. Extraocular muscles intact.  HEENT: Head atraumatic, normocephalic. Oropharynx and nasopharynx clear.  NECK:  Supple, no jugular venous distention. No thyroid enlargement, no tenderness.  LUNGS: Normal breath sounds bilaterally, no wheezing, rales, rhonchi. No use of accessory muscles of respiration.  CARDIOVASCULAR: S1, S2 normal. No murmurs, rubs, or gallops.  ABDOMEN: Soft, nontender, nondistended. Bowel sounds present. No organomegaly or mass.  EXTREMITIES: No cyanosis, clubbing or edema b/l.    NEUROLOGIC: Cranial nerves II through XII are intact. No focal Motor or sensory deficits b/l.   PSYCHIATRIC:  patient is alert and oriented x 3.  SKIN: No obvious rash, lesion, or ulcer.   DATA REVIEW:   CBC Recent Labs  Lab 11/19/17 0146  WBC 9.3  HGB 10.6*  HCT 31.4*  PLT 169    Chemistries  Recent Labs  Lab 11/19/17 0146  NA 137  K 4.3  CL 96*  CO2 26  GLUCOSE 69*  BUN 35*  CREATININE 9.36*  CALCIUM 8.8*  AST 19  ALT <5   ALKPHOS 46  BILITOT 0.6    Cardiac Enzymes Recent Labs  Lab 11/19/17 1945  TROPONINI 0.08*    Microbiology Results  Results for orders placed or performed during the hospital encounter of 11/19/17  Culture, blood (routine x 2)     Status: None (Preliminary result)   Collection Time: 11/19/17  1:46 AM  Result Value Ref Range Status   Specimen Description BLOOD RIGHT HAND  Final   Special Requests   Final    BOTTLES DRAWN AEROBIC AND ANAEROBIC Blood Culture adequate volume   Culture   Final    NO GROWTH 2 DAYS Performed at [facility], [address] [place], [place] [address]    Report Status PENDING  Incomplete  Culture, blood (routine x 2)     Status: None (Preliminary result)   Collection Time: 11/19/17  3:24 AM  Result Value Ref Range Status   Specimen Description BLOOD BLOOD RIGHT HAND  Final   Special Requests   Final    BOTTLES DRAWN AEROBIC AND ANAEROBIC Blood Culture adequate volume   Culture   Final    NO GROWTH 2 DAYS Performed at [facility], [address], [place], [place] [address]    Report Status PENDING  Incomplete  MRSA PCR Screening     Status: None   Collection Time: 11/20/17  3:32 AM  Result Value Ref Range Status   MRSA by PCR NEGATIVE NEGATIVE Final    Comment:        The GeneXpert MRSA Assay (FDA approved for NASAL specimens only), is one component of a comprehensive MRSA colonization surveillance program. It is not intended to diagnose MRSA infection nor to guide or monitor treatment for MRSA infections. Performed at [facility], [address] [place],  [address]     RADIOLOGY:  Nm Myocar Multi W/spect Tamela Oddi Motion / Ef  Result Date: 11/20/2017  The study is normal.  This is a low risk study.  The left ventricular ejection fraction is normal (55-65%).  Blood pressure demonstrated a normal response to exercise.  There was no ST segment deviation noted during stress.       Management  plans discussed with the patient, family and they are in agreement.  CODE STATUS:  Code Status History    Date Active Date Inactive Code Status Order ID Comments User Context   11/19/2017 0507 11/21/2017 0009 Full Code [id number]  Harrie Foreman, MD  Inpatient   TOTAL TIME TAKING CARE OF THIS PATIENT: 40 minutes.    Henreitta Leber M.D on 11/21/2017 at 4:04 PM  Between 7am to 6pm - Pager - [phone]  After 6pm go to [email or web address] - Proofreader  [facility]  Office  [phone]  CC: Primary care physician; [name], MD

## 2017-11-23 ENCOUNTER — Ambulatory Visit (INDEPENDENT_AMBULATORY_CARE_PROVIDER_SITE_OTHER): Payer: Medicare Other | Admitting: Vascular Surgery

## 2017-11-23 ENCOUNTER — Encounter (INDEPENDENT_AMBULATORY_CARE_PROVIDER_SITE_OTHER): Payer: Self-pay | Admitting: Vascular Surgery

## 2017-11-23 VITALS — BP 195/88 | HR 91 | Resp 12 | Ht 66.5 in | Wt 163.0 lb

## 2017-11-23 DIAGNOSIS — I70212 Atherosclerosis of native arteries of extremities with intermittent claudication, left leg: Secondary | ICD-10-CM

## 2017-11-23 DIAGNOSIS — Z992 Dependence on renal dialysis: Secondary | ICD-10-CM | POA: Diagnosis not present

## 2017-11-23 DIAGNOSIS — N186 End stage renal disease: Secondary | ICD-10-CM | POA: Diagnosis not present

## 2017-11-23 MED ORDER — TRAMADOL HCL 50 MG PO TABS: ORAL_TABLET | ORAL | 1 refills | Status: DC

## 2017-11-23 NOTE — Progress Notes (Signed)
Subjective:    Patient ID: Johnny [name]., male    DOB: 04/10/1951, 67 y.o.   MRN: [id number] Chief Complaint  Patient presents with  . Follow-up    ABI U/S follow up   Patient presents for his first post procedure follow-up.  The patient is status post a left lower extremity angiogram with extensive intervention for atherosclerotic disease with claudication on November 16, 2017.  Since his recent intervention the patient has been experiencing increased left lower extremity pain which he sought medical attention at [facility] emergency department.  An ABI and left lower extremity arterial duplex 1 completed on July 13 and one completed on July 16 both with normal ABI and patent stent.  Both duplexes state that they were limited.  The patient denies any rest pain or ulcer formation to the left lower extremity.  The patient denies any pallor or paresthesia of the left lower extremity.  The patient denies any issues with dialysis access at this time.  The patient denies any fever, nausea vomiting.  Review of Systems  Constitutional: Negative.   HENT: Negative.   Eyes: Negative.   Respiratory: Negative.   Cardiovascular:       Left lower extremity pain  Gastrointestinal: Negative.   Endocrine: Negative.   Genitourinary: Negative.   Musculoskeletal: Negative.   Skin: Negative.   Allergic/Immunologic: Negative.   Neurological: Negative.   Hematological: Negative.   Psychiatric/Behavioral: Negative.       Objective:   Physical Exam  Constitutional: He is oriented to person, place, and time. He appears well-developed and well-nourished. No distress.  HENT:  Head: Normocephalic and atraumatic.  Right Ear: External ear normal.  Left Ear: External ear normal.  Eyes: Pupils are equal, round, and reactive to light. Conjunctivae and EOM are normal.  Neck: Normal range of motion.  Cardiovascular: Normal rate, regular rhythm, normal heart sounds and intact distal pulses.   Pulses:      Radial pulses are 2+ on the right side, and 2+ on the left side.       Dorsalis pedis pulses are 2+ on the left side.       Posterior tibial pulses are 2+ on the left side.  Patient with palpable pedal pulses to left lower extremity.  The left foot is warm.  Pulmonary/Chest: Effort normal and breath sounds normal.  Musculoskeletal: Normal range of motion. He exhibits edema (Very mild edema to the left lower extremity).  Neurological: He is alert and oriented to person, place, and time.  Skin: Skin is warm and dry. He is not diaphoretic.  Psychiatric: He has a normal mood and affect. His behavior is normal. Judgment and thought content normal.  Vitals reviewed.  BP (!) 195/88 (BP Location: Right Arm, Patient Position: Sitting)   Pulse 91   Resp 12   Ht 5' 6.5" (1.689 m)   Wt 163 lb (73.9 kg)   BMI 25.91 kg/m   Past Medical History:  Diagnosis Date  . Anemia   . CHF (congestive heart failure) ([place])   . Chronic kidney disease   . Gout   . Hyperlipidemia   . Hypertension    Social History   Socioeconomic History  . Marital status: Single    Spouse name: Not on file  . Number of children: Not on file  . Years of education: Not on file  . Highest education level: Not on file  Occupational History  . Not on file  Social Needs  .  Financial resource strain: Not on file  . Food insecurity:    Worry: Not on file    Inability: Not on file  . Transportation needs:    Medical: Not on file    Non-medical: Not on file  Tobacco Use  . Smoking status: Former Research scientist (life sciences)  . Smokeless tobacco: Never Used  Substance and Sexual Activity  . Alcohol use: No  . Drug use: No  . Sexual activity: Not on file  Lifestyle  . Physical activity:    Days per week: Not on file    Minutes per session: Not on file  . Stress: Not on file  Relationships  . Social connections:    Talks on phone: Not on file    Gets together: Not on file    Attends religious service: Not on file     Active member of club or organization: Not on file    Attends meetings of clubs or organizations: Not on file    Relationship status: Not on file  . Intimate partner violence:    Fear of current or ex partner: Not on file    Emotionally abused: Not on file    Physically abused: Not on file    Forced sexual activity: Not on file  Other Topics Concern  . Not on file  Social History Narrative  . Not on file   Past Surgical History:  Procedure Laterality Date  . A/V SHUNTOGRAM Left 06/21/2017   Procedure: A/V SHUNTOGRAM;  Surgeon: Katha Cabal, MD;  Location: [place] [facility];  Service: Cardiovascular;  Laterality: Left;  . AV FISTULA PLACEMENT Left 09/18/2015   Procedure: INSERTION OF ARTERIOVENOUS (AV) GORE-TEX GRAFT ARM ( BRACH/AXILLARY GRAFT W/ INSTANT STICK GRAFT );  Surgeon: Katha Cabal, MD;  Location: [facility];  Service: Vascular;  Laterality: Left;  . DIALYSIS FISTULA CREATION    . LOWER EXTREMITY ANGIOGRAPHY Left 11/16/2017   Procedure: LOWER EXTREMITY ANGIOGRAPHY;  Surgeon: Algernon Huxley, MD;  Location: [place] [facility];  Service: Cardiovascular;  Laterality: Left;  . PERIPHERAL VASCULAR CATHETERIZATION Left 09/01/2015   Procedure: A/V Shuntogram/Fistulagram;  Surgeon: Katha Cabal, MD;  Location: [place] [facility];  Service: Cardiovascular;  Laterality: Left;  . PERIPHERAL VASCULAR CATHETERIZATION N/A 09/30/2015   Procedure: A/V Shuntogram/Fistulagram with perm cathether removal;  Surgeon: Algernon Huxley, MD;  Location: [place] [facility];  Service: Cardiovascular;  Laterality: N/A;  . PERIPHERAL VASCULAR CATHETERIZATION Left 09/30/2015   Procedure: A/V Shunt Intervention;  Surgeon: Algernon Huxley, MD;  Location: [place] [facility];  Service: Cardiovascular;  Laterality: Left;  . PERIPHERAL VASCULAR CATHETERIZATION Left 12/03/2015   Procedure: Thrombectomy;  Surgeon: Algernon Huxley, MD;  Location: [place] [facility];  Service: Cardiovascular;  Laterality: Left;    . PERIPHERAL VASCULAR CATHETERIZATION Left 01/28/2016   Procedure: Thrombectomy;  Surgeon: Algernon Huxley, MD;  Location: [place] [facility];  Service: Cardiovascular;  Laterality: Left;  . PERIPHERAL VASCULAR CATHETERIZATION N/A 01/28/2016   Procedure: A/V Shuntogram/Fistulagram;  Surgeon: Algernon Huxley, MD;  Location: [place] [facility];  Service: Cardiovascular;  Laterality: N/A;   Family History  Problem Relation Age of Onset  . Hypertension Unknown   . Heart disease Unknown    Allergies  Allergen Reactions  . Shellfish Allergy Anaphylaxis      Assessment & Plan:  Patient presents for his first post procedure follow-up.  The patient is status post a left lower extremity angiogram with extensive intervention for  atherosclerotic disease with claudication on November 16, 2017.  Since his recent intervention the patient has been experiencing increased left lower extremity pain which he sought medical attention at [facility] emergency department.  An ABI and left lower extremity arterial duplex 1 completed on July 13 and one completed on July 16 both with normal ABI and patent stent.  Both duplexes state that they were limited.  The patient denies any rest pain or ulcer formation to the left lower extremity.  The patient denies any pallor or paresthesia of the left lower extremity.  The patient denies any issues with dialysis access at this time.  The patient denies any fever, nausea vomiting.  1. Atherosclerosis of native artery of left lower extremity with intermittent claudication (HCC) - Stable The patient is status post a left lower extremity angiogram with extensive intervention on November 16, 2017 Recent ABI and left lower extremity arterial duplex even though the [facility]'s ultrasound department states the exams were "limited" were notable for normal ABI and patent stents. The patient does have palpable pedal pulses to left lower extremity I  believe the patient is experiencing significant reperfusion syndrome I have prescribed him tramadol 50 mg 1-2 tabs every 6 hours as needed for pain #60 with 1 refill to help with his discomfort Because the test completed at [facility] were "limited" I will bring the patient back to ease his concern and repeat the tests. The patient understands that reperfusion syndrome can last 6 to 8 weeks however should slowly get better.  - VAS [name] ABI WITH/WO TBI; Future - VAS [name] LOWER EXTREMITY ARTERIAL DUPLEX; Future  2. ESRD on dialysis [facility]) - Stable Patient denies any issues with his dialysis access at this time Is followed on a regular basis with an HDA  Current Outpatient Medications on File Prior to Visit  Medication Sig Dispense Refill  . allopurinol (ZYLOPRIM) 100 MG tablet Take 100 mg by mouth daily.    [name] amLODipine (NORVASC) 10 MG tablet Take 10 mg by mouth.    . B-Complex-C-Biotin-Fe & FA (DIALYVITE 800/IRON PO) Take by mouth.    . calcium acetate (PHOSLO) 667 MG capsule Take 1,334 mg by mouth 3 (three) times daily with meals.    . Calcium Gluconate 50 MG TABS Take 50 mg by mouth.    . cinacalcet (SENSIPAR) 90 MG tablet Take 90 mg by mouth daily.     . clonazePAM (KLONOPIN) 0.5 MG tablet Take 0.5 mg by mouth at bedtime.    . clopidogrel (PLAVIX) 75 MG tablet Take 1 tablet (75 mg total) by mouth daily. 30 tablet 11  . colchicine 0.6 MG tablet Take 0.6 mg by mouth daily as needed (for gout flares).     . furosemide (LASIX) 40 MG tablet Take 40 mg by mouth See admin instructions. Take 40 mg once daily on non dialysis days Tues, Fri, Sat, and Sun    . gabapentin (NEURONTIN) 100 MG capsule Take 200 mg by mouth 2 (two) times daily.    . hydrALAZINE (APRESOLINE) 25 MG tablet Take 25 mg by mouth daily.     [name] ibuprofen (ADVIL,MOTRIN) 600 MG tablet Take 600 mg by mouth.    . irbesartan (AVAPRO) 150 MG tablet Take 300 mg by mouth daily.     [name] lidocaine-prilocaine (EMLA) cream  Apply 1 application topically as needed (port access).    . metoprolol succinate (TOPROL-XL) 50 MG 24 hr tablet Take 50 mg by  mouth daily.     . pregabalin (LYRICA) 50 MG capsule Take 50 mg by mouth daily. After each dialysis session    . sevelamer carbonate (RENVELA) 800 MG tablet Take 2,400 mg by mouth 3 (three) times daily with meals.      No current facility-administered medications on file prior to visit.    There are no Patient Instructions on file for this visit. No follow-ups on file.  Ghassan Coggeshall A Rayan Ines, PA-C

## 2017-11-24 LAB — CULTURE, BLOOD (ROUTINE X 2)
Culture: NO GROWTH
Culture: NO GROWTH
SPECIAL REQUESTS: ADEQUATE
SPECIAL REQUESTS: ADEQUATE

## 2017-11-27 ENCOUNTER — Encounter: Payer: Self-pay | Admitting: Emergency Medicine

## 2017-11-27 ENCOUNTER — Emergency Department: Payer: Medicare Other

## 2017-11-27 ENCOUNTER — Other Ambulatory Visit: Payer: Self-pay

## 2017-11-27 ENCOUNTER — Observation Stay
Admission: EM | Admit: 2017-11-27 | Discharge: 2017-12-01 | Disposition: A | Discharge status: Skilled Nursing Facility | Payer: Medicare Other | Attending: Internal Medicine | Admitting: Internal Medicine

## 2017-11-27 DIAGNOSIS — Z9889 Other specified postprocedural states: Secondary | ICD-10-CM | POA: Diagnosis not present

## 2017-11-27 DIAGNOSIS — Z91013 Allergy to seafood: Secondary | ICD-10-CM | POA: Diagnosis not present

## 2017-11-27 DIAGNOSIS — R609 Edema, unspecified: Secondary | ICD-10-CM | POA: Insufficient documentation

## 2017-11-27 DIAGNOSIS — Z992 Dependence on renal dialysis: Secondary | ICD-10-CM | POA: Diagnosis not present

## 2017-11-27 DIAGNOSIS — I132 Hypertensive heart and chronic kidney disease with heart failure and with stage 5 chronic kidney disease, or end stage renal disease: Secondary | ICD-10-CM | POA: Diagnosis not present

## 2017-11-27 DIAGNOSIS — L97529 Non-pressure chronic ulcer of other part of left foot with unspecified severity: Secondary | ICD-10-CM | POA: Insufficient documentation

## 2017-11-27 DIAGNOSIS — E11621 Type 2 diabetes mellitus with foot ulcer: Secondary | ICD-10-CM | POA: Insufficient documentation

## 2017-11-27 DIAGNOSIS — D631 Anemia in chronic kidney disease: Secondary | ICD-10-CM | POA: Diagnosis not present

## 2017-11-27 DIAGNOSIS — L03032 Cellulitis of left toe: Secondary | ICD-10-CM | POA: Diagnosis not present

## 2017-11-27 DIAGNOSIS — Z79899 Other long term (current) drug therapy: Secondary | ICD-10-CM | POA: Insufficient documentation

## 2017-11-27 DIAGNOSIS — Z7902 Long term (current) use of antithrombotics/antiplatelets: Secondary | ICD-10-CM | POA: Insufficient documentation

## 2017-11-27 DIAGNOSIS — L0390 Cellulitis, unspecified: Secondary | ICD-10-CM | POA: Diagnosis present

## 2017-11-27 DIAGNOSIS — Z66 Do not resuscitate: Secondary | ICD-10-CM | POA: Insufficient documentation

## 2017-11-27 DIAGNOSIS — N186 End stage renal disease: Secondary | ICD-10-CM | POA: Insufficient documentation

## 2017-11-27 DIAGNOSIS — M109 Gout, unspecified: Secondary | ICD-10-CM | POA: Diagnosis not present

## 2017-11-27 DIAGNOSIS — R112 Nausea with vomiting, unspecified: Secondary | ICD-10-CM

## 2017-11-27 DIAGNOSIS — M79605 Pain in left leg: Secondary | ICD-10-CM | POA: Diagnosis present

## 2017-11-27 DIAGNOSIS — E785 Hyperlipidemia, unspecified: Secondary | ICD-10-CM | POA: Insufficient documentation

## 2017-11-27 DIAGNOSIS — I5032 Chronic diastolic (congestive) heart failure: Secondary | ICD-10-CM | POA: Diagnosis not present

## 2017-11-27 DIAGNOSIS — L97501 Non-pressure chronic ulcer of other part of unspecified foot limited to breakdown of skin: Secondary | ICD-10-CM

## 2017-11-27 DIAGNOSIS — M79672 Pain in left foot: Secondary | ICD-10-CM | POA: Insufficient documentation

## 2017-11-27 LAB — COMPREHENSIVE METABOLIC PANEL
ALT: 5 U/L (ref 0–44)
AST: 21 U/L (ref 15–41)
Albumin: 3.5 g/dL (ref 3.5–5.0)
Alkaline Phosphatase: 51 U/L (ref 38–126)
Anion gap: 14 (ref 5–15)
BILIRUBIN TOTAL: 0.7 mg/dL (ref 0.3–1.2)
BUN: 19 mg/dL (ref 8–23)
CO2: 30 mmol/L (ref 22–32)
Calcium: 9.2 mg/dL (ref 8.9–10.3)
Chloride: 94 mmol/L — ABNORMAL LOW (ref 98–111)
Creatinine, Ser: 6.89 mg/dL — ABNORMAL HIGH (ref 0.61–1.24)
GFR calc Af Amer: 8 mL/min — ABNORMAL LOW (ref >60)
GFR, EST NON AFRICAN AMERICAN: 7 mL/min — AB (ref >60)
Glucose, Bld: 95 mg/dL (ref 70–99)
POTASSIUM: 3.9 mmol/L (ref 3.5–5.1)
Sodium: 138 mmol/L (ref 135–145)
TOTAL PROTEIN: 7.8 g/dL (ref 6.5–8.1)

## 2017-11-27 LAB — CBC WITH DIFFERENTIAL/PLATELET
Basophils Absolute: 0.1 10*3/uL (ref 0–0.1)
Basophils Relative: 1 %
Eosinophils Absolute: 0.1 10*3/uL (ref 0–0.7)
Eosinophils Relative: 1 %
HCT: 30.1 % — ABNORMAL LOW (ref 40.0–52.0)
Hemoglobin: 10.2 g/dL — ABNORMAL LOW (ref 13.0–18.0)
LYMPHS PCT: 8 %
Lymphs Abs: 1 10*3/uL (ref 1.0–3.6)
MCH: 31.5 pg (ref 26.0–34.0)
MCHC: 33.7 g/dL (ref 32.0–36.0)
MCV: 93.3 fL (ref 80.0–100.0)
Monocytes Absolute: 1 10*3/uL (ref 0.2–1.0)
Monocytes Relative: 8 %
NEUTROS PCT: 82 %
Neutro Abs: 10.2 10*3/uL — ABNORMAL HIGH (ref 1.4–6.5)
PLATELETS: 275 10*3/uL (ref 150–440)
RBC: 3.23 MIL/uL — ABNORMAL LOW (ref 4.40–5.90)
RDW: 15.1 % — ABNORMAL HIGH (ref 11.5–14.5)
WBC: 12.4 10*3/uL — AB (ref 3.8–10.6)

## 2017-11-27 LAB — MRSA PCR SCREENING: MRSA by PCR: NEGATIVE

## 2017-11-27 LAB — SEDIMENTATION RATE: Sed Rate: 67 mm/hr — ABNORMAL HIGH (ref 0–20)

## 2017-11-27 MED ORDER — CINACALCET HCL 30 MG PO TABS
90.0000 mg | ORAL_TABLET | Freq: Every day | ORAL | Status: DC
  Administered 2017-11-28 – 2017-12-01 (×4): 90 mg via ORAL
  Filled 2017-11-27 (×5): qty 3

## 2017-11-27 MED ORDER — IRBESARTAN 150 MG PO TABS
300.0000 mg | ORAL_TABLET | Freq: Every day | ORAL | Status: DC
  Administered 2017-11-28: 300 mg via ORAL
  Filled 2017-11-27: qty 2

## 2017-11-27 MED ORDER — SODIUM CHLORIDE 0.9% FLUSH
3.0000 mL | Freq: Two times a day (BID) | INTRAVENOUS | Status: DC
  Administered 2017-11-27 – 2017-12-01 (×8): 3 mL via INTRAVENOUS

## 2017-11-27 MED ORDER — COLCHICINE 0.6 MG PO TABS
0.6000 mg | ORAL_TABLET | Freq: Every day | ORAL | Status: DC | PRN
  Administered 2017-11-28 – 2017-11-29 (×2): 0.6 mg via ORAL
  Filled 2017-11-27 (×2): qty 1

## 2017-11-27 MED ORDER — HYDRALAZINE HCL 25 MG PO TABS
25.0000 mg | ORAL_TABLET | Freq: Every day | ORAL | Status: DC
  Administered 2017-11-28 – 2017-12-01 (×4): 25 mg via ORAL
  Filled 2017-11-27 (×4): qty 1

## 2017-11-27 MED ORDER — ATORVASTATIN CALCIUM 10 MG PO TABS
10.0000 mg | ORAL_TABLET | Freq: Every day | ORAL | Status: DC
Start: 2017-11-28 — End: 2017-12-01
  Administered 2017-11-28 – 2017-12-01 (×4): 10 mg via ORAL
  Filled 2017-11-27 (×4): qty 1

## 2017-11-27 MED ORDER — MORPHINE SULFATE (PF) 4 MG/ML IV SOLN
4.0000 mg | Freq: Once | INTRAVENOUS | Status: AC
  Administered 2017-11-27: 4 mg via INTRAVENOUS
  Filled 2017-11-27: qty 1

## 2017-11-27 MED ORDER — ACETAMINOPHEN 650 MG RE SUPP: 650.0000 mg | Freq: Four times a day (QID) | RECTAL | Status: DC | PRN

## 2017-11-27 MED ORDER — ONDANSETRON HCL 4 MG/2ML IJ SOLN
4.0000 mg | Freq: Four times a day (QID) | INTRAMUSCULAR | Status: DC | PRN
  Administered 2017-11-29 – 2017-11-30 (×3): 4 mg via INTRAVENOUS
  Filled 2017-11-27 (×3): qty 2

## 2017-11-27 MED ORDER — VANCOMYCIN HCL IN DEXTROSE 1-5 GM/200ML-% IV SOLN: 1000.0000 mg | Freq: Once | INTRAVENOUS | Status: DC

## 2017-11-27 MED ORDER — CALCIUM ACETATE (PHOS BINDER) 667 MG PO CAPS
1334.0000 mg | ORAL_CAPSULE | Freq: Three times a day (TID) | ORAL | Status: DC
  Administered 2017-11-28 – 2017-12-01 (×9): 1334 mg via ORAL
  Filled 2017-11-27 (×8): qty 2

## 2017-11-27 MED ORDER — SODIUM CHLORIDE 0.9 % IV SOLN: 1.0000 g | INTRAVENOUS | Status: DC

## 2017-11-27 MED ORDER — METOPROLOL SUCCINATE ER 50 MG PO TB24
50.0000 mg | ORAL_TABLET | Freq: Every day | ORAL | Status: DC
  Administered 2017-11-27 – 2017-12-01 (×5): 50 mg via ORAL
  Filled 2017-11-27 (×5): qty 1

## 2017-11-27 MED ORDER — VANCOMYCIN VARIABLE DOSE PER UNSTABLE RENAL FUNCTION (PHARMACIST DOSING): Status: DC

## 2017-11-27 MED ORDER — CLOPIDOGREL BISULFATE 75 MG PO TABS
75.0000 mg | ORAL_TABLET | Freq: Every day | ORAL | Status: DC
  Administered 2017-11-28 – 2017-12-01 (×4): 75 mg via ORAL
  Filled 2017-11-27 (×4): qty 1

## 2017-11-27 MED ORDER — SODIUM CHLORIDE 0.9 % IV SOLN: 250.0000 mL | INTRAVENOUS | Status: DC | PRN

## 2017-11-27 MED ORDER — ONDANSETRON HCL 4 MG/2ML IJ SOLN
4.0000 mg | Freq: Once | INTRAMUSCULAR | Status: AC
  Administered 2017-11-27: 4 mg via INTRAVENOUS
  Filled 2017-11-27: qty 2

## 2017-11-27 MED ORDER — HEPARIN SODIUM (PORCINE) 5000 UNIT/ML IJ SOLN
5000.0000 [IU] | Freq: Three times a day (TID) | INTRAMUSCULAR | Status: DC
  Administered 2017-11-27 – 2017-12-01 (×11): 5000 [IU] via SUBCUTANEOUS
  Filled 2017-11-27 (×11): qty 1

## 2017-11-27 MED ORDER — VANCOMYCIN HCL IN DEXTROSE 750-5 MG/150ML-% IV SOLN: 750.0000 mg | INTRAVENOUS | Status: DC

## 2017-11-27 MED ORDER — SEVELAMER CARBONATE 800 MG PO TABS
2400.0000 mg | ORAL_TABLET | Freq: Three times a day (TID) | ORAL | Status: DC
  Administered 2017-11-28 – 2017-12-01 (×9): 2400 mg via ORAL
  Filled 2017-11-27 (×9): qty 3

## 2017-11-27 MED ORDER — AMLODIPINE BESYLATE 10 MG PO TABS
10.0000 mg | ORAL_TABLET | Freq: Every day | ORAL | Status: DC
  Administered 2017-11-28 – 2017-12-01 (×4): 10 mg via ORAL
  Filled 2017-11-27 (×4): qty 1

## 2017-11-27 MED ORDER — TRAMADOL HCL 50 MG PO TABS
50.0000 mg | ORAL_TABLET | Freq: Four times a day (QID) | ORAL | Status: DC | PRN
  Administered 2017-11-27 – 2017-12-01 (×10): 50 mg via ORAL
  Filled 2017-11-27 (×10): qty 1

## 2017-11-27 MED ORDER — GABAPENTIN 100 MG PO CAPS
200.0000 mg | ORAL_CAPSULE | Freq: Two times a day (BID) | ORAL | Status: DC
  Administered 2017-11-27 – 2017-12-01 (×8): 200 mg via ORAL
  Filled 2017-11-27 (×8): qty 2

## 2017-11-27 MED ORDER — ALLOPURINOL 100 MG PO TABS
100.0000 mg | ORAL_TABLET | Freq: Every day | ORAL | Status: DC
  Administered 2017-11-28 – 2017-12-01 (×4): 100 mg via ORAL
  Filled 2017-11-27 (×4): qty 1

## 2017-11-27 MED ORDER — SODIUM CHLORIDE 0.9 % IV SOLN
1.0000 g | Freq: Once | INTRAVENOUS | Status: AC
  Administered 2017-11-27: 1 g via INTRAVENOUS
  Filled 2017-11-27: qty 10

## 2017-11-27 MED ORDER — PREGABALIN 50 MG PO CAPS
50.0000 mg | ORAL_CAPSULE | Freq: Every day | ORAL | Status: DC
  Administered 2017-11-28 – 2017-12-01 (×4): 50 mg via ORAL
  Filled 2017-11-27 (×4): qty 1

## 2017-11-27 MED ORDER — CALCIUM GLUCONATE 50 MG PO TABS: 50.0000 mg | ORAL_TABLET | Freq: Every day | ORAL | Status: DC

## 2017-11-27 MED ORDER — ACETAMINOPHEN 325 MG PO TABS: 650.0000 mg | ORAL_TABLET | Freq: Four times a day (QID) | ORAL | Status: DC | PRN

## 2017-11-27 MED ORDER — FUROSEMIDE 40 MG PO TABS
40.0000 mg | ORAL_TABLET | ORAL | Status: DC
  Administered 2017-11-30: 40 mg via ORAL
  Filled 2017-11-27: qty 1

## 2017-11-27 MED ORDER — SODIUM CHLORIDE 0.9% FLUSH: 3.0000 mL | INTRAVENOUS | Status: DC | PRN

## 2017-11-27 MED ORDER — VANCOMYCIN HCL 10 G IV SOLR: 1.0000 g | Freq: Once | INTRAVENOUS | Status: DC

## 2017-11-27 MED ORDER — ONDANSETRON HCL 4 MG PO TABS: 4.0000 mg | ORAL_TABLET | Freq: Four times a day (QID) | ORAL | Status: DC | PRN

## 2017-11-27 MED ORDER — PIPERACILLIN-TAZOBACTAM 3.375 G IVPB: 3.3750 g | Freq: Two times a day (BID) | INTRAVENOUS | Status: DC

## 2017-11-27 MED ORDER — SENNOSIDES-DOCUSATE SODIUM 8.6-50 MG PO TABS: 1.0000 | ORAL_TABLET | Freq: Every evening | ORAL | Status: DC | PRN

## 2017-11-27 MED ORDER — SODIUM CHLORIDE 0.9 % IV SOLN
1500.0000 mg | Freq: Once | INTRAVENOUS | Status: DC
  Filled 2017-11-27: qty 1500

## 2017-11-27 MED ORDER — CLONAZEPAM 0.5 MG PO TABS
0.5000 mg | ORAL_TABLET | Freq: Every day | ORAL | Status: DC
  Administered 2017-11-27 – 2017-11-30 (×4): 0.5 mg via ORAL
  Filled 2017-11-27 (×4): qty 1

## 2017-11-27 NOTE — ED Notes (Signed)
Patient transported to X-ray 

## 2017-11-27 NOTE — ED Notes (Signed)
Admitting doctor at bedside at this time.

## 2017-11-27 NOTE — Progress Notes (Signed)
Advanced care plan. Purpose of the Encounter: CODE STATUS Parties in Attendance: Patient Patient's Decision Capacity: Good Subjective/Patient's story: Presented to emergency room for an ulcer in the left foot Objective/Medical story Has left foot ulcer and pain Needs IV antibiotics and vascular surgery evaluation Goals of care determination:  Advance care directives and goals of care discussed For now patient wants everything done which includes CPR, intubation and ventilator if the need arises CODE STATUS: Full code Time spent discussing advanced care planning: 16 minutes

## 2017-11-27 NOTE — H&P (Signed)
[name]    MR#:  [id number]  DATE OF BIRTH:  [birth year]  DATE OF ADMISSION:  11/27/2017  PRIMARY CARE PHYSICIAN: Johnny Sia, MD   REQUESTING/REFERRING PHYSICIAN:   CHIEF COMPLAINT:   Chief Complaint  Patient presents with  . Leg Pain    HISTORY OF PRESENT ILLNESS: Johnny [name]  is a 67 y.o. male with a known history of end-stage renal disease on dialysis, congestive heart failure, gout, hyperlipidemia, hypertension, peripheral arterial disease recently had femoral stents placed in the left lower extremity for arterial occlusion.  Patient has left foot ulcer which is nonhealing and has pain in the left lower extremity according to vascular surgery the pain is typical for post reperfusion.  No evidence of any fever.  No complaints of any chest pain, shortness of breath.  PAST MEDICAL HISTORY:   Past Medical History:  Diagnosis Date  . Anemia   . CHF (congestive heart failure) ([place])   . Chronic kidney disease   . Gout   . Hyperlipidemia   . Hypertension     PAST SURGICAL HISTORY:  Past Surgical History:  Procedure Laterality Date  . A/V SHUNTOGRAM Left 06/21/2017   Procedure: A/V SHUNTOGRAM;  Surgeon: Katha Cabal, MD;  Location: [place];  Service: Cardiovascular;  Laterality: Left;  . AV FISTULA PLACEMENT Left 09/18/2015   Procedure: INSERTION OF ARTERIOVENOUS (AV) GORE-TEX GRAFT ARM ( BRACH/AXILLARY GRAFT W/ INSTANT STICK GRAFT );  Surgeon: Katha Cabal, MD;  Location: [facility];  Service: Vascular;  Laterality: Left;  . DIALYSIS FISTULA CREATION    . LOWER EXTREMITY ANGIOGRAPHY Left 11/16/2017   Procedure: LOWER EXTREMITY ANGIOGRAPHY;  Surgeon: Algernon Huxley, MD;  Location: [place] [facility];  Service: Cardiovascular;  Laterality: Left;  . PERIPHERAL VASCULAR CATHETERIZATION Left 09/01/2015   Procedure: A/V Shuntogram/Fistulagram;  Surgeon: Katha Cabal, MD;   Location: [place] [facility];  Service: Cardiovascular;  Laterality: Left;  . PERIPHERAL VASCULAR CATHETERIZATION N/A 09/30/2015   Procedure: A/V Shuntogram/Fistulagram with perm cathether removal;  Surgeon: Algernon Huxley, MD;  Location: [facility] [facility];  Service: Cardiovascular;  Laterality: N/A;  . PERIPHERAL VASCULAR CATHETERIZATION Left 09/30/2015   Procedure: A/V Shunt Intervention;  Surgeon: Algernon Huxley, MD;  Location: [place] [facility];  Service: Cardiovascular;  Laterality: Left;  . PERIPHERAL VASCULAR CATHETERIZATION Left 12/03/2015   Procedure: Thrombectomy;  Surgeon: Algernon Huxley, MD;  Location: [place] [facility];  Service: Cardiovascular;  Laterality: Left;  . PERIPHERAL VASCULAR CATHETERIZATION Left 01/28/2016   Procedure: Thrombectomy;  Surgeon: Algernon Huxley, MD;  Location: [place] [facility];  Service: Cardiovascular;  Laterality: Left;  . PERIPHERAL VASCULAR CATHETERIZATION N/A 01/28/2016   Procedure: A/V Shuntogram/Fistulagram;  Surgeon: Algernon Huxley, MD;  Location: [place] [facility];  Service: Cardiovascular;  Laterality: N/A;    SOCIAL HISTORY:  Social History   Tobacco Use  . Smoking status: Former Research scientist (life sciences)  . Smokeless tobacco: Never Used  Substance Use Topics  . Alcohol use: No    FAMILY HISTORY:  Family History  Problem Relation Age of Onset  . Hypertension Unknown   . Heart disease Unknown     DRUG ALLERGIES:  Allergies  Allergen Reactions  . Shellfish Allergy Anaphylaxis    REVIEW OF SYSTEMS:   CONSTITUTIONAL: No fever, fatigue or weakness.  EYES: No blurred or double vision.  EARS, NOSE, AND THROAT: No  tinnitus or ear pain.  RESPIRATORY: No cough, shortness of breath, wheezing or hemoptysis.  CARDIOVASCULAR: No chest pain, orthopnea, edema.  GASTROINTESTINAL: No nausea, vomiting, diarrhea or abdominal pain.  GENITOURINARY: No dysuria, hematuria.  ENDOCRINE: No polyuria, nocturia,  HEMATOLOGY: No anemia, easy bruising or bleeding SKIN:  Left foot ulcer MUSCULOSKELETAL: Left leg pain NEUROLOGIC: No tingling, numbness, weakness.  PSYCHIATRY: No anxiety or depression.   MEDICATIONS AT HOME:  Prior to Admission medications   Medication Sig Start Date End Date Taking? Authorizing Provider  allopurinol (ZYLOPRIM) 100 MG tablet Take 100 mg by mouth daily.   Yes [provider]  amLODipine (NORVASC) 10 MG tablet Take 10 mg by mouth daily.    Yes [provider]  atorvastatin (LIPITOR) 10 MG tablet Take 1 tablet by mouth daily. 11/23/17  Yes [provider]  calcium acetate (PHOSLO) 667 MG capsule Take 1,334 mg by mouth 3 (three) times daily with meals.   Yes [provider]  Calcium Gluconate 50 MG TABS Take 50 mg by mouth 3 (three) times daily.  05/19/11  Yes [provider]  cinacalcet (SENSIPAR) 90 MG tablet Take 90 mg by mouth daily.    Yes [provider]  clonazePAM (KLONOPIN) 0.5 MG tablet Take 0.5 mg by mouth at bedtime.   Yes [provider]  clopidogrel (PLAVIX) 75 MG tablet Take 1 tablet (75 mg total) by mouth daily. 12/03/15  Yes Dew, Erskine Squibb, MD  furosemide (LASIX) 40 MG tablet Take 40 mg by mouth daily.    Yes [provider]  gabapentin (NEURONTIN) 100 MG capsule Take 200 mg by mouth 2 (two) times daily.   Yes [provider]  hydrALAZINE (APRESOLINE) 25 MG tablet Take 25 mg by mouth daily.    Yes [provider]  irbesartan (AVAPRO) 300 MG tablet Take 300 mg by mouth daily.    Yes [provider]  pregabalin (LYRICA) 50 MG capsule Take 50 mg by mouth daily. After each dialysis session   Yes [provider]  sevelamer carbonate (RENVELA) 800 MG tablet Take 2,400 mg by mouth 3 (three) times daily with meals.    Yes [provider]  colchicine 0.6 MG tablet Take 0.6 mg by mouth daily as needed (for gout flares).     [provider]  lidocaine-prilocaine (EMLA) cream Apply 1 application topically as  needed (port access).    [provider]  traMADol Veatrice Bourbon) 50 MG tablet One to Two Tabs By Mouth Every Six Hours As Needed For Pain 11/23/17   Stegmayer, Joelene Millin A, PA-C      PHYSICAL EXAMINATION:   VITAL SIGNS: Blood pressure (!) 164/72, pulse 92, temperature 98.2 F (36.8 C), temperature source Oral, resp. rate 16, height 5\' 7"  (1.702 m), weight 74.8 kg (165 lb), SpO2 99 %.  GENERAL:  67 y.o.-year-old patient lying in the bed with no acute distress.  EYES: Pupils equal, round, reactive to light and accommodation. No scleral icterus. Extraocular muscles intact.  HEENT: Head atraumatic, normocephalic. Oropharynx and nasopharynx clear.  NECK:  Supple, no jugular venous distention. No thyroid enlargement, no tenderness.  LUNGS: Normal breath sounds bilaterally, no wheezing, rales,rhonchi or crepitation. No use of accessory muscles of respiration.  CARDIOVASCULAR: S1, S2 normal. No murmurs, rubs, or gallops.  ABDOMEN: Soft, nontender, nondistended. Bowel sounds present. No organomegaly or mass.  EXTREMITIES: No pedal edema, cyanosis, or clubbing.  NEUROLOGIC: Cranial nerves II through XII are intact. Muscle strength 5/5 in all extremities. Sensation  intact. Gait not checked.  PSYCHIATRIC: The patient is alert and oriented x 3.  SKIN: left foot ulcer 3/3 cm  LABORATORY PANEL:   CBC Recent Labs  Lab 11/27/17 1526  WBC 12.4*  HGB 10.2*  HCT 30.1*  PLT 275  MCV 93.3  MCH 31.5  MCHC 33.7  RDW 15.1*  LYMPHSABS 1.0  MONOABS 1.0  EOSABS 0.1  BASOSABS 0.1   ------------------------------------------------------------------------------------------------------------------  Chemistries  Recent Labs  Lab 11/27/17 1526  NA 138  K 3.9  CL 94*  CO2 30  GLUCOSE 95  BUN 19  CREATININE 6.89*  CALCIUM 9.2  AST 21  ALT 5  ALKPHOS 51  BILITOT 0.7   ------------------------------------------------------------------------------------------------------------------ estimated  creatinine clearance is 9.6 mL/min (A) (by C-G formula based on SCr of 6.89 mg/dL (H)). ------------------------------------------------------------------------------------------------------------------ No results for input(s): TSH, T4TOTAL, T3FREE, THYROIDAB in the last 72 hours.  Invalid input(s): FREET3   Coagulation profile No results for input(s): INR, PROTIME in the last 168 hours. ------------------------------------------------------------------------------------------------------------------- No results for input(s): DDIMER in the last 72 hours. -------------------------------------------------------------------------------------------------------------------  Cardiac Enzymes No results for input(s): CKMB, TROPONINI, MYOGLOBIN in the last 168 hours.  Invalid input(s): CK ------------------------------------------------------------------------------------------------------------------ Invalid input(s): POCBNP  ---------------------------------------------------------------------------------------------------------------  Urinalysis No results found for: COLORURINE, APPEARANCEUR, LABSPEC, PHURINE, GLUCOSEU, HGBUR, BILIRUBINUR, KETONESUR, PROTEINUR, UROBILINOGEN, NITRITE, LEUKOCYTESUR   RADIOLOGY: Dg Foot Complete Left  Result Date: 11/27/2017 CLINICAL DATA:  Left leg pain and swelling, history of left leg revascularization EXAM: LEFT FOOT - COMPLETE 3+ VIEW COMPARISON:  11/19/2017 FINDINGS: Degenerative changes of the first MTP joint are again seen. No acute fracture or dislocation is noted. Diffuse vascular calcifications are seen. No other gross soft tissue abnormality is noted. IMPRESSION: Chronic changes without acute abnormality. Electronically Signed   By: Inez Catalina M.D.   On: 11/27/2017 15:31    EKG: Orders placed or performed during the hospital encounter of 11/19/17  . EKG 12-Lead  . EKG 12-Lead    IMPRESSION AND PLAN:  67 year old male patient with history  of end-stage renal disease on dialysis, peripheral arterial disease, femoral stent placement, hyperlipidemia, gout, congestive heart failure presented to the emergency room with left lower extremity pain and nonhealing ulcer  -Left lower extremity nonhealing ulcer Vascular surgery follow-up Wound care IV antibiotics  -Left leg pain which could be secondary to reperfusion/cellulitis  -End-stage renal disease on dialysis  nephrology follow-up for dialysis  -Peripheral arterial disease Continue statin and Plavix  All the records are reviewed and case discussed with ED provider. Management plans discussed with the patient, family and they are in agreement.  CODE STATUS:Full code Code Status History    Date Active Date Inactive Code Status Order ID Comments User Context   11/19/2017 0507 11/21/2017 0009 Full Code [id number]  Harrie Foreman, MD Inpatient   11/16/2017 1205 11/16/2017 1717 Full Code [id number]  Algernon Huxley, MD Inpatient   01/28/2016 1050 01/28/2016 1456 Full Code [id number]  Algernon Huxley, MD Inpatient   09/28/2015 2306 10/02/2015 1738 Full Code [id number]  Lance Coon, MD Inpatient    Advance Directive Documentation     Most Recent Value  Type of Advance Directive  Healthcare Power of [place], Living will  Pre-existing out of facility DNR order (yellow form or pink MOST form)  -  "MOST" Form in Place?  -       TOTAL TIME TAKING CARE OF THIS PATIENT: 52 minutes.    Saundra Shelling M.D on 11/27/2017 at 5:25 PM  Between 7am to 6pm -  Pager - [phone] After 6pm go to [email or web address] - password [email or web address] [facility]  Office  [phone]  CC: Primary care physician; Johnny Sia, MD

## 2017-11-27 NOTE — ED Triage Notes (Signed)
Pt arrives via [facility] with complaints of severe left leg pain with swelling. Reports procedure 7/16 in which he had clot removed. Followed up with vascular 7/18, reported his severe pain and they just told him to follow up in 6-8 weeks. Decubitus ulcer noted to lateral aspect of left foot.

## 2017-11-27 NOTE — ED Provider Notes (Signed)
[facility] Emergency Department Provider Note   ____________________________________________   First MD Initiated Contact with Patient 11/27/17 1447     (approximate)  I have reviewed the triage vital signs and the nursing notes.   HISTORY  Chief Complaint Leg Pain    HPI Johnny [name]. is a 67 y.o. male who had clot removed from his leg.  He had increasing pain afterwards and was seen in the office he had a good ankle-brachial index in the office apparently had a good arterial ultrasound and was diagnosed with reperfusion syndrome.  He comes in today with continually increasing pain in the left leg he has developed an ulcer in the lateral side of his left foot the ulcer looks necrotic but the rest of the foot is red and warm read patient reports the pain goes up to his bellybutton.  Pain is increasingly severe and has been present since the surgery on the 16th of the month.   Past Medical History:  Diagnosis Date  . Anemia   . CHF (congestive heart failure) ([place])   . Chronic kidney disease   . Gout   . Hyperlipidemia   . Hypertension     Patient Active Problem List   Diagnosis Date Noted  . Chest pain 11/19/2017  . Atherosclerosis of native arteries of extremity with intermittent claudication ([place]) 11/07/2017  . Elevated troponin 10/02/2015  . Complications, dialysis, catheter, mechanical ([place]) 10/02/2015  . Musculoskeletal chest pain 09/28/2015  . ESRD on dialysis ([place]) 09/28/2015  . HTN (hypertension) 09/28/2015  . Chronic diastolic CHF (congestive heart failure) ([place]) 09/28/2015  . Gout 09/28/2015    Past Surgical History:  Procedure Laterality Date  . A/V SHUNTOGRAM Left 06/21/2017   Procedure: A/V SHUNTOGRAM;  Surgeon: Katha Cabal, MD;  Location: [place] [facility];  Service: Cardiovascular;  Laterality: Left;  . AV FISTULA PLACEMENT Left 09/18/2015   Procedure: INSERTION OF ARTERIOVENOUS (AV) GORE-TEX GRAFT ARM (  BRACH/AXILLARY GRAFT W/ INSTANT STICK GRAFT );  Surgeon: Katha Cabal, MD;  Location: [facility];  Service: Vascular;  Laterality: Left;  . DIALYSIS FISTULA CREATION    . LOWER EXTREMITY ANGIOGRAPHY Left 11/16/2017   Procedure: LOWER EXTREMITY ANGIOGRAPHY;  Surgeon: Algernon Huxley, MD;  Location: [place] [facility];  Service: Cardiovascular;  Laterality: Left;  . PERIPHERAL VASCULAR CATHETERIZATION Left 09/01/2015   Procedure: A/V Shuntogram/Fistulagram;  Surgeon: Katha Cabal, MD;  Location: [place] [facility];  Service: Cardiovascular;  Laterality: Left;  . PERIPHERAL VASCULAR CATHETERIZATION N/A 09/30/2015   Procedure: A/V Shuntogram/Fistulagram with perm cathether removal;  Surgeon: Algernon Huxley, MD;  Location: [place] [facility];  Service: Cardiovascular;  Laterality: N/A;  . PERIPHERAL VASCULAR CATHETERIZATION Left 09/30/2015   Procedure: A/V Shunt Intervention;  Surgeon: Algernon Huxley, MD;  Location: [place] [facility];  Service: Cardiovascular;  Laterality: Left;  . PERIPHERAL VASCULAR CATHETERIZATION Left 12/03/2015   Procedure: Thrombectomy;  Surgeon: Algernon Huxley, MD;  Location: [place] [facility];  Service: Cardiovascular;  Laterality: Left;  . PERIPHERAL VASCULAR CATHETERIZATION Left 01/28/2016   Procedure: Thrombectomy;  Surgeon: Algernon Huxley, MD;  Location: [place] [facility];  Service: Cardiovascular;  Laterality: Left;  . PERIPHERAL VASCULAR CATHETERIZATION N/A 01/28/2016   Procedure: A/V Shuntogram/Fistulagram;  Surgeon: Algernon Huxley, MD;  Location: [place] [facility];  Service: Cardiovascular;  Laterality: N/A;    Prior to Admission medications   Medication Sig Start Date End Date Taking?  Authorizing Provider  allopurinol (ZYLOPRIM) 100 MG tablet Take 100 mg by mouth daily.    [provider]  amLODipine (NORVASC) 10 MG tablet Take 10 mg by mouth.    [provider]  atorvastatin (LIPITOR) 10 MG tablet Take 1 tablet by mouth daily. 11/23/17   [provider]  B-Complex-C-Biotin-Fe & FA (DIALYVITE 800/IRON PO) Take by mouth.    [provider]  calcium acetate (PHOSLO) 667 MG capsule Take 1,334 mg by mouth 3 (three) times daily with meals.    [provider]  Calcium Gluconate 50 MG TABS Take 50 mg by mouth. 05/19/11   [provider]  cinacalcet (SENSIPAR) 90 MG tablet Take 90 mg by mouth daily.     [provider]  clonazePAM (KLONOPIN) 0.5 MG tablet Take 0.5 mg by mouth at bedtime.    [provider]  clopidogrel (PLAVIX) 75 MG tablet Take 1 tablet (75 mg total) by mouth daily. 12/03/15   Algernon Huxley, MD  colchicine 0.6 MG tablet Take 0.6 mg by mouth daily as needed (for gout flares).     [provider]  furosemide (LASIX) 40 MG tablet Take 40 mg by mouth See admin instructions. Take 40 mg once daily on non dialysis days Tues, Fri, Sat, and Sun    [provider]  gabapentin (NEURONTIN) 100 MG capsule Take 200 mg by mouth 2 (two) times daily.    [provider]  hydrALAZINE (APRESOLINE) 25 MG tablet Take 25 mg by mouth daily.     [provider]  ibuprofen (ADVIL,MOTRIN) 600 MG tablet Take 600 mg by mouth. 02/28/13   [provider]  irbesartan (AVAPRO) 150 MG tablet Take 300 mg by mouth daily.     [provider]  lidocaine-prilocaine (EMLA) cream Apply 1 application topically as needed (port access).    [provider]  metoprolol succinate (TOPROL-XL) 50 MG 24 hr tablet Take 50 mg by mouth daily.     [provider]  pregabalin (LYRICA) 50 MG capsule Take 50 mg by mouth daily. After each dialysis session    [provider]  sevelamer carbonate (RENVELA) 800 MG tablet Take 2,400 mg by mouth 3 (three) times daily with meals.     [provider]  traMADol Veatrice Bourbon) 50 MG tablet One to Two Tabs By Mouth Every Six Hours As Needed For Pain 11/23/17   Stegmayer, Joelene Millin A, PA-C    Allergies Shellfish  allergy  Family History  Problem Relation Age of Onset  . Hypertension Unknown   . Heart disease Unknown     Social History Social History   Tobacco Use  . Smoking status: Former Research scientist (life sciences)  . Smokeless tobacco: Never Used  Substance Use Topics  . Alcohol use: No  . Drug use: No    Review of Systems  Constitutional: No fever/chills Eyes: No visual changes. ENT: No sore throat. Cardiovascular: Denies chest pain. Respiratory: Denies shortness of breath. Gastrointestinal: No abdominal pain.  No nausea, no vomiting.  No diarrhea.  No constipation. Genitourinary: Negative for dysuria. Musculoskeletal: Negative for back pain. Skin: Negative for rash. Neurological: Negative for headaches, focal weakness   ____________________________________________   PHYSICAL EXAM:  VITAL SIGNS: ED Triage Vitals  Enc Vitals Group     BP 11/27/17 1446 (!) 164/72     Pulse Rate 11/27/17 1446 92     Resp 11/27/17 1446 16     Temp 11/27/17 1521 98.2 F (36.8 C)  Temp Source 11/27/17 1521 Oral     SpO2 11/27/17 1446 99 %     Weight 11/27/17 1447 165 lb (74.8 kg)     Height 11/27/17 1447 5\' 7"  (1.702 m)     Head Circumference --      Peak Flow --      Pain Score 11/27/17 1447 10     Pain Loc --      Pain Edu? --      Excl. in [place]? --     Constitutional: Alert and oriented. Well appearing and in no acute distress. Eyes: Conjunctivae are normal. Head: Atraumatic. Nose: No congestion/rhinnorhea. Mouth/Throat: Mucous membranes are moist.  Oropharynx non-erythematous. Neck: No stridor.  Cardiovascular: Normal rate, regular rhythm. Grossly normal heart sounds.  Good peripheral circulation. Respiratory: Normal respiratory effort.  No retractions. Lungs CTAB. Gastrointestinal: Soft and nontender. No distention. No abdominal bruits. No CVA tenderness. Musculoskeletal: Good distal pulse left foot is warmer than the right there is redness there as well not sure if this is reperfusion or  cellulitis discussed with Dr. Delana Meyer Neurologic:  Normal speech and language. No gross focal neurologic deficits are appreciated.  Skin:  Skin is warm, dry and intact.  Foot is red and warm there is an ulcer on the side of the foot laterally which is necrotic Psychiatric: Mood and affect are normal. Speech and behavior are normal.  ____________________________________________   LABS (all labs ordered are listed, but only abnormal results are displayed)  Labs Reviewed  COMPREHENSIVE METABOLIC PANEL - Abnormal; Notable for the following components:      Result Value   Chloride 94 (*)    Creatinine, Ser 6.89 (*)    GFR calc non Af Amer 7 (*)    GFR calc Af Amer 8 (*)    All other components within normal limits  CBC WITH DIFFERENTIAL/PLATELET - Abnormal; Notable for the following components:   WBC 12.4 (*)    RBC 3.23 (*)    Hemoglobin 10.2 (*)    HCT 30.1 (*)    RDW 15.1 (*)    Neutro Abs 10.2 (*)    All other components within normal limits  SEDIMENTATION RATE - Abnormal; Notable for the following components:   Sed Rate 67 (*)    All other components within normal limits   ____________________________________________  EKG   ____________________________________________  RADIOLOGY  ED MD interpretation: EKG reviewed by me read by radiology shows no acute disease  Official radiology report(s): Dg Foot Complete Left  Result Date: 11/27/2017 CLINICAL DATA:  Left leg pain and swelling, history of left leg revascularization EXAM: LEFT FOOT - COMPLETE 3+ VIEW COMPARISON:  11/19/2017 FINDINGS: Degenerative changes of the first MTP joint are again seen. No acute fracture or dislocation is noted. Diffuse vascular calcifications are seen. No other gross soft tissue abnormality is noted. IMPRESSION: Chronic changes without acute abnormality. Electronically Signed   By: Inez Catalina M.D.   On: 11/27/2017 15:31     ____________________________________________   PROCEDURES  Procedure(s) performed:   Procedures  Critical Care performed:   ____________________________________________   INITIAL IMPRESSION / ASSESSMENT AND PLAN / ED COURSE  Discussed with Dr. Delana Meyer on-call for vascular.  Dr. Delana Meyer wants me to admit the patient for IV antibiotics he will see him to check on him and possibly get a CT angiogram.  Patient gets dialysis.  He had his dialysis today.      ____________________________________________   FINAL CLINICAL IMPRESSION(S) / ED DIAGNOSES  Final diagnoses:  Left leg pain  Cellulitis of toe of left foot  Ulcer of foot, limited to breakdown of skin, unspecified laterality [facility])     ED Discharge Orders    None       Note:  This document was prepared using Dragon voice recognition software and may include unintentional dictation errors.    Nena Polio, MD 11/27/17 1630

## 2017-11-27 NOTE — Progress Notes (Signed)
Pharmacy Antibiotic Note  Johnny [name]. is a 67 y.o. male admitted on 11/27/2017 with cellulitis.  Pharmacy has been consulted for Zosyn and vancomycin dosing.  Plan: 1. Zosyn 3.375 gm IV Q12H EI 2. Vancomycin 1.5 gm IV x 1 in ED followed by vancomycin 750 mg IV Q-dialysis MWF, will check first level Friday AM.   Height: 5\' 7"  (170.2 cm) Weight: 165 lb (74.8 kg) IBW/kg (Calculated) : 66.1  Temp (24hrs), Avg:98.2 F (36.8 C), Min:98.2 F (36.8 C), Max:98.2 F (36.8 C)  Recent Labs  Lab 11/27/17 1526  WBC 12.4*  CREATININE 6.89*    Estimated Creatinine Clearance: 9.6 mL/min (A) (by C-G formula based on SCr of 6.89 mg/dL (H)).    Allergies  Allergen Reactions  . Shellfish Allergy Anaphylaxis    Antimicrobials this admission:   Dose adjustments this admission:   Microbiology results:  BCx:   UCx:    Sputum:    MRSA PCR:   Thank you for allowing pharmacy to be a part of this patient's care.  Laural Benes, Pharm.D., BCPS Clinical Pharmacist 11/27/2017 4:50 PM

## 2017-11-27 NOTE — H&P (Signed)
[facility] [place] NAME: Johnny [name]    MR#:  [id number]  DATE OF BIRTH:  November 02, 1950  DATE OF ADMISSION:  11/27/2017  PRIMARY CARE PHYSICIAN: [name], MD   REQUESTING/REFERRING PHYSICIAN:   CHIEF COMPLAINT:   Chief Complaint  Patient presents with  . Leg Pain    HISTORY OF PRESENT ILLNESS: Johnny [name]  is a 67 y.o. male with a known history of end-stage renal disease on dialysis, congestive heart failure, gout, hyperlipidemia, hypertension, peripheral arterial disease recently had femoral stents placed in the left lower extremity for arterial occlusion.  Patient has left foot ulcer which is nonhealing and has pain in the left lower extremity according to vascular surgery the pain is typical for post reperfusion.  No evidence of any fever.  No complaints of any chest pain, shortness of breath.  PAST MEDICAL HISTORY:   Past Medical History:  Diagnosis Date  . Anemia   . CHF (congestive heart failure) ([place])   . Chronic kidney disease   . Gout   . Hyperlipidemia   . Hypertension     PAST SURGICAL HISTORY:  Past Surgical History:  Procedure Laterality Date  . A/V SHUNTOGRAM Left 06/21/2017   Procedure: A/V SHUNTOGRAM;  Surgeon: Katha Cabal, MD;  Location: [place] [facility];  Service: Cardiovascular;  Laterality: Left;  . AV FISTULA PLACEMENT Left 09/18/2015   Procedure: INSERTION OF ARTERIOVENOUS (AV) GORE-TEX GRAFT ARM ( BRACH/AXILLARY GRAFT W/ INSTANT STICK GRAFT );  Surgeon: Katha Cabal, MD;  Location: [facility];  Service: Vascular;  Laterality: Left;  . DIALYSIS FISTULA CREATION    . LOWER EXTREMITY ANGIOGRAPHY Left 11/16/2017   Procedure: LOWER EXTREMITY ANGIOGRAPHY;  Surgeon: Algernon Huxley, MD;  Location: [place] [facility];  Service: Cardiovascular;  Laterality: Left;  . PERIPHERAL VASCULAR CATHETERIZATION Left 09/01/2015   Procedure: A/V Shuntogram/Fistulagram;  Surgeon: Katha Cabal, MD;   Location: [place] [facility];  Service: Cardiovascular;  Laterality: Left;  . PERIPHERAL VASCULAR CATHETERIZATION N/A 09/30/2015   Procedure: A/V Shuntogram/Fistulagram with perm cathether removal;  Surgeon: Algernon Huxley, MD;  Location: [place] [facility];  Service: Cardiovascular;  Laterality: N/A;  . PERIPHERAL VASCULAR CATHETERIZATION Left 09/30/2015   Procedure: A/V Shunt Intervention;  Surgeon: Algernon Huxley, MD;  Location: [place] [facility];  Service: Cardiovascular;  Laterality: Left;  . PERIPHERAL VASCULAR CATHETERIZATION Left 12/03/2015   Procedure: Thrombectomy;  Surgeon: Algernon Huxley, MD;  Location: [place] [facility];  Service: Cardiovascular;  Laterality: Left;  . PERIPHERAL VASCULAR CATHETERIZATION Left 01/28/2016   Procedure: Thrombectomy;  Surgeon: Algernon Huxley, MD;  Location: [place] [facility];  Service: Cardiovascular;  Laterality: Left;  . PERIPHERAL VASCULAR CATHETERIZATION N/A 01/28/2016   Procedure: A/V Shuntogram/Fistulagram;  Surgeon: Algernon Huxley, MD;  Location: [place] [facility];  Service: Cardiovascular;  Laterality: N/A;    SOCIAL HISTORY:  Social History   Tobacco Use  . Smoking status: Former Research scientist (life sciences)  . Smokeless tobacco: Never Used  Substance Use Topics  . Alcohol use: No    FAMILY HISTORY:  Family History  Problem Relation Age of Onset  . Hypertension Unknown   . Heart disease Unknown     DRUG ALLERGIES:  Allergies  Allergen Reactions  . Shellfish Allergy Anaphylaxis    REVIEW OF SYSTEMS:   CONSTITUTIONAL: No fever, fatigue or weakness.  EYES: No blurred or double vision.  EARS, NOSE, AND THROAT: No  tinnitus or ear pain.  RESPIRATORY: No cough, shortness of breath, wheezing or hemoptysis.  CARDIOVASCULAR: No chest pain, orthopnea, edema.  GASTROINTESTINAL: No nausea, vomiting, diarrhea or abdominal pain.  GENITOURINARY: No dysuria, hematuria.  ENDOCRINE: No polyuria, nocturia,  HEMATOLOGY: No anemia, easy bruising or bleeding SKIN:  Left foot ulcer MUSCULOSKELETAL: Left leg pain NEUROLOGIC: No tingling, numbness, weakness.  PSYCHIATRY: No anxiety or depression.   MEDICATIONS AT HOME:  Prior to Admission medications   Medication Sig Start Date End Date Taking? Authorizing Provider  allopurinol (ZYLOPRIM) 100 MG tablet Take 100 mg by mouth daily.   Yes [provider]  amLODipine (NORVASC) 10 MG tablet Take 10 mg by mouth daily.    Yes [provider]  atorvastatin (LIPITOR) 10 MG tablet Take 1 tablet by mouth daily. 11/23/17  Yes [provider]  calcium acetate (PHOSLO) 667 MG capsule Take 1,334 mg by mouth 3 (three) times daily with meals.   Yes [provider]  Calcium Gluconate 50 MG TABS Take 50 mg by mouth 3 (three) times daily.  05/19/11  Yes [provider]  cinacalcet (SENSIPAR) 90 MG tablet Take 90 mg by mouth daily.    Yes [provider]  clonazePAM (KLONOPIN) 0.5 MG tablet Take 0.5 mg by mouth at bedtime.   Yes [provider]  clopidogrel (PLAVIX) 75 MG tablet Take 1 tablet (75 mg total) by mouth daily. 12/03/15  Yes Dew, Erskine Squibb, MD  furosemide (LASIX) 40 MG tablet Take 40 mg by mouth daily.    Yes [provider]  gabapentin (NEURONTIN) 100 MG capsule Take 200 mg by mouth 2 (two) times daily.   Yes [provider]  hydrALAZINE (APRESOLINE) 25 MG tablet Take 25 mg by mouth daily.    Yes [provider]  irbesartan (AVAPRO) 300 MG tablet Take 300 mg by mouth daily.    Yes [provider]  pregabalin (LYRICA) 50 MG capsule Take 50 mg by mouth daily. After each dialysis session   Yes [provider]  sevelamer carbonate (RENVELA) 800 MG tablet Take 2,400 mg by mouth 3 (three) times daily with meals.    Yes [provider]  colchicine 0.6 MG tablet Take 0.6 mg by mouth daily as needed (for gout flares).     [provider]  lidocaine-prilocaine (EMLA) cream Apply 1 application topically as  needed (port access).    [provider]  traMADol Veatrice Bourbon) 50 MG tablet One to Two Tabs By Mouth Every Six Hours As Needed For Pain 11/23/17   Stegmayer, Joelene Millin A, PA-C      PHYSICAL EXAMINATION:   VITAL SIGNS: Blood pressure (!) 164/72, pulse 92, temperature 98.2 F (36.8 C), temperature source Oral, resp. rate 16, height 5\' 7"  (1.702 m), weight 74.8 kg (165 lb), SpO2 99 %.  GENERAL:  67 y.o.-year-old patient lying in the bed with no acute distress.  EYES: Pupils equal, round, reactive to light and accommodation. No scleral icterus. Extraocular muscles intact.  HEENT: Head atraumatic, normocephalic. Oropharynx and nasopharynx clear.  NECK:  Supple, no jugular venous distention. No thyroid enlargement, no tenderness.  LUNGS: Normal breath sounds bilaterally, no wheezing, rales,rhonchi or crepitation. No use of accessory muscles of respiration.  CARDIOVASCULAR: S1, S2 normal. No murmurs, rubs, or gallops.  ABDOMEN: Soft, nontender, nondistended. Bowel sounds present. No organomegaly or mass.  EXTREMITIES: No pedal edema, cyanosis, or clubbing.  NEUROLOGIC: Cranial nerves II through XII are intact. Muscle strength 5/5 in all extremities. Sensation  intact. Gait not checked.  PSYCHIATRIC: The patient is alert and oriented x 3.  SKIN: left foot ulcer 3/3 cm  LABORATORY PANEL:   CBC Recent Labs  Lab 11/27/17 1526  WBC 12.4*  HGB 10.2*  HCT 30.1*  PLT 275  MCV 93.3  MCH 31.5  MCHC 33.7  RDW 15.1*  LYMPHSABS 1.0  MONOABS 1.0  EOSABS 0.1  BASOSABS 0.1   ------------------------------------------------------------------------------------------------------------------  Chemistries  Recent Labs  Lab 11/27/17 1526  NA 138  K 3.9  CL 94*  CO2 30  GLUCOSE 95  BUN 19  CREATININE 6.89*  CALCIUM 9.2  AST 21  ALT 5  ALKPHOS 51  BILITOT 0.7   ------------------------------------------------------------------------------------------------------------------ estimated  creatinine clearance is 9.6 mL/min (A) (by C-G formula based on SCr of 6.89 mg/dL (H)). ------------------------------------------------------------------------------------------------------------------ No results for input(s): TSH, T4TOTAL, T3FREE, THYROIDAB in the last 72 hours.  Invalid input(s): FREET3   Coagulation profile No results for input(s): INR, PROTIME in the last 168 hours. ------------------------------------------------------------------------------------------------------------------- No results for input(s): DDIMER in the last 72 hours. -------------------------------------------------------------------------------------------------------------------  Cardiac Enzymes No results for input(s): CKMB, TROPONINI, MYOGLOBIN in the last 168 hours.  Invalid input(s): CK ------------------------------------------------------------------------------------------------------------------ Invalid input(s): POCBNP  ---------------------------------------------------------------------------------------------------------------  Urinalysis No results found for: COLORURINE, APPEARANCEUR, LABSPEC, PHURINE, GLUCOSEU, HGBUR, BILIRUBINUR, KETONESUR, PROTEINUR, UROBILINOGEN, NITRITE, LEUKOCYTESUR   RADIOLOGY: Dg Foot Complete Left  Result Date: 11/27/2017 CLINICAL DATA:  Left leg pain and swelling, history of left leg revascularization EXAM: LEFT FOOT - COMPLETE 3+ VIEW COMPARISON:  11/19/2017 FINDINGS: Degenerative changes of the first MTP joint are again seen. No acute fracture or dislocation is noted. Diffuse vascular calcifications are seen. No other gross soft tissue abnormality is noted. IMPRESSION: Chronic changes without acute abnormality. Electronically Signed   By: Inez Catalina M.D.   On: 11/27/2017 15:31    EKG: Orders placed or performed during the hospital encounter of 11/19/17  . EKG 12-Lead  . EKG 12-Lead    IMPRESSION AND PLAN:  67 year old male patient with history  of end-stage renal disease on dialysis, peripheral arterial disease, femoral stent placement, hyperlipidemia, gout, congestive heart failure presented to the emergency room with left lower extremity pain and nonhealing ulcer  -Left lower extremity nonhealing ulcer Vascular surgery follow-up Wound care IV anabiotics  -Left leg pain which could be secondary to reperfusion/cellulitis  -End-stage renal disease on dialysis  nephrology follow-up for dialysis  -Peripheral arterial disease Continue statin and Plavix  All the records are reviewed and case discussed with ED provider. Management plans discussed with the patient, family and they are in agreement.  CODE STATUS:Full code Code Status History    Date Active Date Inactive Code Status Order ID Comments User Context   11/19/2017 0507 11/21/2017 0009 Full Code [id number]  Harrie Foreman, MD Inpatient   11/16/2017 1205 11/16/2017 1717 Full Code [id number]  Algernon Huxley, MD Inpatient   01/28/2016 1050 01/28/2016 1456 Full Code [id number]  Algernon Huxley, MD Inpatient   09/28/2015 2306 10/02/2015 1738 Full Code [id number]  Lance Coon, MD Inpatient    Advance Directive Documentation     Most Recent Value  Type of Advance Directive  Healthcare Power of [place], Living will  Pre-existing out of facility DNR order (yellow form or pink MOST form)  -  "MOST" Form in Place?  -       TOTAL TIME TAKING CARE OF THIS PATIENT: 52 minutes.    Saundra Shelling M.D on 11/27/2017 at 5:06 PM  Between 7am to 6pm -  Pager - [phone] After 6pm go to [email or web address] - password [email or web address] [facility]  Office  [phone]  CC: Primary care physician; [name], MD

## 2017-11-28 ENCOUNTER — Inpatient Hospital Stay: Payer: Medicare Other

## 2017-11-28 DIAGNOSIS — L03116 Cellulitis of left lower limb: Secondary | ICD-10-CM

## 2017-11-28 DIAGNOSIS — M79605 Pain in left leg: Secondary | ICD-10-CM

## 2017-11-28 DIAGNOSIS — N186 End stage renal disease: Secondary | ICD-10-CM

## 2017-11-28 LAB — BASIC METABOLIC PANEL
ANION GAP: 9 (ref 5–15)
BUN: 22 mg/dL (ref 8–23)
CALCIUM: 9.6 mg/dL (ref 8.9–10.3)
CHLORIDE: 98 mmol/L (ref 98–111)
CO2: 34 mmol/L — AB (ref 22–32)
Creatinine, Ser: 8.33 mg/dL — ABNORMAL HIGH (ref 0.61–1.24)
GFR calc non Af Amer: 6 mL/min — ABNORMAL LOW (ref >60)
GFR, EST AFRICAN AMERICAN: 7 mL/min — AB (ref >60)
GLUCOSE: 92 mg/dL (ref 70–99)
POTASSIUM: 4.5 mmol/L (ref 3.5–5.1)
Sodium: 141 mmol/L (ref 135–145)

## 2017-11-28 LAB — CBC
HCT: 29.9 % — ABNORMAL LOW (ref 40.0–52.0)
HEMOGLOBIN: 10.1 g/dL — AB (ref 13.0–18.0)
MCH: 31.6 pg (ref 26.0–34.0)
MCHC: 33.9 g/dL (ref 32.0–36.0)
MCV: 93.5 fL (ref 80.0–100.0)
Platelets: 248 10*3/uL (ref 150–440)
RBC: 3.2 MIL/uL — AB (ref 4.40–5.90)
RDW: 15.2 % — ABNORMAL HIGH (ref 11.5–14.5)
WBC: 10.3 10*3/uL (ref 3.8–10.6)

## 2017-11-28 MED ORDER — VANCOMYCIN HCL 10 G IV SOLR
1500.0000 mg | Freq: Once | INTRAVENOUS | Status: AC
  Administered 2017-11-28: 1500 mg via INTRAVENOUS
  Filled 2017-11-28: qty 1500

## 2017-11-28 MED ORDER — RENA-VITE PO TABS
1.0000 | ORAL_TABLET | Freq: Every day | ORAL | Status: DC
  Administered 2017-11-28 – 2017-11-30 (×3): 1 via ORAL
  Filled 2017-11-28 (×4): qty 1

## 2017-11-28 MED ORDER — VANCOMYCIN HCL IN DEXTROSE 750-5 MG/150ML-% IV SOLN
750.0000 mg | INTRAVENOUS | Status: DC
Start: 2017-11-29 — End: 2017-12-01
  Administered 2017-11-29: 750 mg via INTRAVENOUS
  Filled 2017-11-28 (×3): qty 150

## 2017-11-28 MED ORDER — COLLAGENASE 250 UNIT/GM EX OINT
TOPICAL_OINTMENT | Freq: Every day | CUTANEOUS | Status: DC
  Administered 2017-11-28 – 2017-12-01 (×4): via TOPICAL
  Filled 2017-11-28: qty 30

## 2017-11-28 MED ORDER — PIPERACILLIN-TAZOBACTAM 3.375 G IVPB
3.3750 g | Freq: Two times a day (BID) | INTRAVENOUS | Status: DC
  Administered 2017-11-28 – 2017-12-01 (×7): 3.375 g via INTRAVENOUS
  Filled 2017-11-28 (×6): qty 50

## 2017-11-28 MED ORDER — NEPRO/CARBSTEADY PO LIQD
237.0000 mL | Freq: Two times a day (BID) | ORAL | Status: DC
Start: 2017-11-28 — End: 2017-12-01
  Administered 2017-11-28 – 2017-12-01 (×4): 237 mL via ORAL

## 2017-11-28 MED ORDER — CHLORHEXIDINE GLUCONATE CLOTH 2 % EX PADS
6.0000 | MEDICATED_PAD | Freq: Every day | CUTANEOUS | Status: DC
  Administered 2017-11-30 – 2017-12-01 (×2): 6 via TOPICAL

## 2017-11-28 MED ORDER — OCUVITE-LUTEIN PO CAPS
1.0000 | ORAL_CAPSULE | Freq: Every day | ORAL | Status: DC
  Administered 2017-11-28 – 2017-11-30 (×2): 1 via ORAL
  Filled 2017-11-28 (×4): qty 1

## 2017-11-28 NOTE — Progress Notes (Signed)
Pharmacy Antibiotic Note  Johnny [name]. is a 67 y.o. male admitted on 11/27/2017 with cellulitis.  Pharmacy has been consulted for Zosyn and vancomycin dosing.  Plan: 1. Zosyn 3.375 g IV Q12H EI 2. Vancomycin 1500 mg IV x 1 followed by vancomycin 750 mg IV Q-dialysis MWF, will check first level next Monday AM before 3rd HD session.   Height: 5\' 7"  (170.2 cm) Weight: 165 lb (74.8 kg) IBW/kg (Calculated) : 66.1  Temp (24hrs), Avg:98.3 F (36.8 C), Min:97.8 F (36.6 C), Max:99.2 F (37.3 C)  Recent Labs  Lab 11/27/17 1526 11/28/17 0425  WBC 12.4* 10.3  CREATININE 6.89* 8.33*    Estimated Creatinine Clearance: 7.9 mL/min (A) (by C-G formula based on SCr of 8.33 mg/dL (H)).    Allergies  Allergen Reactions  . Shellfish Allergy Anaphylaxis    Antimicrobials this admission: Ceftriaxone 7/22>>7/23 Vanc/Zosyn 7/23>>  Dose adjustments this admission:   Microbiology results: MRSA PCR: neg  Thank you for allowing pharmacy to be a part of this patient's care.  Rocky Morel, Pharm.D., BCPS Clinical Pharmacist 11/28/2017 10:55 AM

## 2017-11-28 NOTE — Consult Note (Signed)
[facility] Consult Note  MRN : [id number]  Johnny [name]. is a 67 y.o. (1950/05/29) male who presents with chief complaint of  Chief Complaint  Patient presents with  . Leg Pain  .  History of Present Illness:   I am asked to see the patient by Dr Estanislado Pandy.  The patient is status post angiogram with intervention on 11/16/2017. The patient notes increased pain in the left lower extremity. He states he has rest pain symptoms. Previous wounds have not healed and states he has increased drainage.  No new ulcers or wounds have occurred since the last visit.  There have been no significant changes to the patient's overall health care.  The patient denies amaurosis fugax or recent TIA symptoms. There are no recent neurological changes noted. The patient denies history of DVT, PE or superficial thrombophlebitis. The patient denies recent episodes of angina or shortness of breath.     Current Facility-Administered Medications  Medication Dose Route Frequency Provider Last Rate Last Dose  . 0.9 %  sodium chloride infusion  250 mL Intravenous PRN Pyreddy, Reatha Harps, MD      . acetaminophen (TYLENOL) tablet 650 mg  650 mg Oral Q6H PRN Pyreddy, Reatha Harps, MD       Or  . acetaminophen (TYLENOL) suppository 650 mg  650 mg Rectal Q6H PRN Pyreddy, Reatha Harps, MD      . allopurinol (ZYLOPRIM) tablet 100 mg  100 mg Oral Daily Pyreddy, Reatha Harps, MD   100 mg at 11/28/17 0958  . amLODipine (NORVASC) tablet 10 mg  10 mg Oral Daily Pyreddy, Reatha Harps, MD   10 mg at 11/28/17 0958  . atorvastatin (LIPITOR) tablet 10 mg  10 mg Oral Daily Pyreddy, Reatha Harps, MD   10 mg at 11/28/17 0959  . calcium acetate (PHOSLO) capsule 1,334 mg  1,334 mg Oral TID WC Saundra Shelling, MD   1,334 mg at 11/28/17 1812  . Calcium Gluconate TABS 50 mg  50 mg Oral Daily Pyreddy, Reatha Harps, MD      . Derrill Memo ON 11/29/2017] Chlorhexidine Gluconate Cloth 2 % PADS 6 each  6 each Topical Q0600 Murlean Iba, MD      . cinacalcet  (SENSIPAR) tablet 90 mg  90 mg Oral Daily Pyreddy, Reatha Harps, MD   90 mg at 11/28/17 0958  . clonazePAM (KLONOPIN) tablet 0.5 mg  0.5 mg Oral QHS Pyreddy, Reatha Harps, MD   0.5 mg at 11/27/17 2128  . clopidogrel (PLAVIX) tablet 75 mg  75 mg Oral Daily Saundra Shelling, MD   75 mg at 11/28/17 0958  . colchicine tablet 0.6 mg  0.6 mg Oral Daily PRN Saundra Shelling, MD   0.6 mg at 11/28/17 0017  . collagenase (SANTYL) ointment   Topical Daily Samara Deist, DPM      . feeding supplement (NEPRO CARB STEADY) liquid 237 mL  237 mL Oral BID BM Mody, Sital, MD   237 mL at 11/28/17 1743  . furosemide (LASIX) tablet 40 mg  40 mg Oral See admin instructions Pyreddy, Reatha Harps, MD      . gabapentin (NEURONTIN) capsule 200 mg  200 mg Oral BID Pyreddy, Reatha Harps, MD   200 mg at 11/28/17 0959  . heparin injection 5,000 Units  5,000 Units Subcutaneous Q8H Pyreddy, Reatha Harps, MD   5,000 Units at 11/28/17 1416  . hydrALAZINE (APRESOLINE) tablet 25 mg  25 mg Oral Daily Saundra Shelling, MD   25 mg at 11/28/17 0959  . irbesartan (AVAPRO) tablet 300 mg  300  mg Oral Daily Saundra Shelling, MD   300 mg at 11/28/17 0958  . metoprolol succinate (TOPROL-XL) 24 hr tablet 50 mg  50 mg Oral Daily Pyreddy, Reatha Harps, MD   50 mg at 11/28/17 0958  . multivitamin (RENA-VIT) tablet 1 tablet  1 tablet Oral QHS Mody, Sital, MD      . multivitamin-lutein (OCUVITE-LUTEIN) capsule 1 capsule  1 capsule Oral Daily Bettey Costa, MD   1 capsule at 11/28/17 1743  . ondansetron (ZOFRAN) tablet 4 mg  4 mg Oral Q6H PRN Pyreddy, Reatha Harps, MD       Or  . ondansetron (ZOFRAN) injection 4 mg  4 mg Intravenous Q6H PRN Pyreddy, Pavan, MD      . piperacillin-tazobactam (ZOSYN) IVPB 3.375 g  3.375 g Intravenous Q12H Rocky Morel, RPH   Stopped at 11/28/17 1557  . pregabalin (LYRICA) capsule 50 mg  50 mg Oral Daily Pyreddy, Reatha Harps, MD   50 mg at 11/28/17 0958  . senna-docusate (Senokot-S) tablet 1 tablet  1 tablet Oral QHS PRN Pyreddy, Reatha Harps, MD      . sevelamer carbonate (RENVELA)  tablet 2,400 mg  2,400 mg Oral TID WC Pyreddy, Reatha Harps, MD   2,400 mg at 11/28/17 1812  . sodium chloride flush (NS) 0.9 % injection 3 mL  3 mL Intravenous Q12H Pyreddy, Reatha Harps, MD   3 mL at 11/28/17 1158  . sodium chloride flush (NS) 0.9 % injection 3 mL  3 mL Intravenous PRN Pyreddy, Reatha Harps, MD      . traMADol (ULTRAM) tablet 50 mg  50 mg Oral Q6H PRN Saundra Shelling, MD   50 mg at 11/28/17 1748  . [START ON 11/29/2017] vancomycin (VANCOCIN) IVPB 750 mg/150 ml premix  750 mg Intravenous Q M,W,F-HD Rocky Morel, [facility]        Past Medical History:  Diagnosis Date  . Anemia   . CHF (congestive heart failure) ([place])   . Chronic kidney disease   . Gout   . Hyperlipidemia   . Hypertension     Past Surgical History:  Procedure Laterality Date  . A/V SHUNTOGRAM Left 06/21/2017   Procedure: A/V SHUNTOGRAM;  Surgeon: Katha Cabal, MD;  Location: [place] [facility];  Service: Cardiovascular;  Laterality: Left;  . AV FISTULA PLACEMENT Left 09/18/2015   Procedure: INSERTION OF ARTERIOVENOUS (AV) GORE-TEX GRAFT ARM ( BRACH/AXILLARY GRAFT W/ INSTANT STICK GRAFT );  Surgeon: Katha Cabal, MD;  Location: [facility];  Service: Vascular;  Laterality: Left;  . DIALYSIS FISTULA CREATION    . LOWER EXTREMITY ANGIOGRAPHY Left 11/16/2017   Procedure: LOWER EXTREMITY ANGIOGRAPHY;  Surgeon: Algernon Huxley, MD;  Location: [place] [facility];  Service: Cardiovascular;  Laterality: Left;  . PERIPHERAL VASCULAR CATHETERIZATION Left 09/01/2015   Procedure: A/V Shuntogram/Fistulagram;  Surgeon: Katha Cabal, MD;  Location: [place] [facility];  Service: Cardiovascular;  Laterality: Left;  . PERIPHERAL VASCULAR CATHETERIZATION N/A 09/30/2015   Procedure: A/V Shuntogram/Fistulagram with perm cathether removal;  Surgeon: Algernon Huxley, MD;  Location: [place] [facility];  Service: Cardiovascular;  Laterality: N/A;  . PERIPHERAL VASCULAR CATHETERIZATION Left 09/30/2015   Procedure: A/V Shunt Intervention;  Surgeon:  Algernon Huxley, MD;  Location: [place] [facility];  Service: Cardiovascular;  Laterality: Left;  . PERIPHERAL VASCULAR CATHETERIZATION Left 12/03/2015   Procedure: Thrombectomy;  Surgeon: Algernon Huxley, MD;  Location: [facility];  Service: Cardiovascular;  Laterality: Left;  . PERIPHERAL VASCULAR CATHETERIZATION Left 01/28/2016  Procedure: Thrombectomy;  Surgeon: Algernon Huxley, MD;  Location: [place];  Service: Cardiovascular;  Laterality: Left;  . PERIPHERAL VASCULAR CATHETERIZATION N/A 01/28/2016   Procedure: A/V Shuntogram/Fistulagram;  Surgeon: Algernon Huxley, MD;  Location: [place] [facility];  Service: Cardiovascular;  Laterality: N/A;    Social History Social History   Tobacco Use  . Smoking status: Former Research scientist (life sciences)  . Smokeless tobacco: Never Used  Substance Use Topics  . Alcohol use: No  . Drug use: No    Family History Family History  Problem Relation Age of Onset  . Hypertension Unknown   . Heart disease Unknown   No family history of bleeding/clotting disorders, porphyria or autoimmune disease   Allergies  Allergen Reactions  . Shellfish Allergy Anaphylaxis     REVIEW OF SYSTEMS (Negative unless checked)  Constitutional: [] Weight loss  [] Fever  [] Chills Cardiac: [] Chest pain   [] Chest pressure   [] Palpitations   [] Shortness of breath when laying flat   [] Shortness of breath at rest   [] Shortness of breath with exertion. Vascular:  [x] Pain in legs with walking   [x] Pain in legs at rest   [] Pain in legs when laying flat   [] Claudication   [] Pain in feet when walking  [] Pain in feet at rest  [] Pain in feet when laying flat   [] History of DVT   [] Phlebitis   [] Swelling in legs   [] Varicose veins   [x] Non-healing ulcers Pulmonary:   [] Uses home oxygen   [] Productive cough   [] Hemoptysis   [] Wheeze  [] COPD   [] Asthma Neurologic:  [] Dizziness  [] Blackouts   [] Seizures   [] History of stroke   [] History of TIA  [] Aphasia   [] Temporary blindness   [] Dysphagia    [] Weakness or numbness in arms   [] Weakness or numbness in legs Musculoskeletal:  [] Arthritis   [] Joint swelling   [] Joint pain   [] Low back pain Hematologic:  [] Easy bruising  [] Easy bleeding   [] Hypercoagulable state   [] Anemic  [] Hepatitis Gastrointestinal:  [] Blood in stool   [] Vomiting blood  [] Gastroesophageal reflux/heartburn   [] Difficulty swallowing. Genitourinary:  [x] Chronic kidney disease   [] Difficult urination  [] Frequent urination  [] Burning with urination   [] Blood in urine Skin:  [] Rashes   [x] Ulcers   [x] Wounds Psychological:  [] History of anxiety   []  History of major depression.   Physical Examination  Vitals:   11/28/17 0956 11/28/17 1352 11/28/17 1515 11/28/17 1916  BP: 131/66 (!) 157/74  126/61  Pulse: 70 68  (!) 58  Resp:    20  Temp:  98 F (36.7 C)  98.4 F (36.9 C)  TempSrc:    Oral  SpO2: 98% 100%  99%  Weight:   160 lb 12.8 oz (72.9 kg)   Height:       Body mass index is 25.18 kg/m.  Head: [place]/AT, No temporalis wasting. Prominent temp pulse not noted. Ear/Nose/Throat: Nares w/o erythema or drainage, oropharynx w/o obsrtuction,  Eyes: PERRLA, Sclera nonicteric.  Neck: Supple, no nuchal rigidity.  No bruit or JVD.  Pulmonary:  Breath sounds equal bilaterally, no use of accessory muscles.  Cardiac: RRR, normal S1, S2, no Murmurs, rubs or gallops. Vascular: palpable left PT nonpalpable left DP  Trace palpable right pedal pulses. Left arm AV access good thrill good bruit Gastrointestinal: soft, non-tender, non-distended.  Musculoskeletal: Moves all extremities.  No deformity or atrophy. No edema. Neurologic: CN 2-12 intact. Symmetrical.  Speech is fluent.  Psychiatric: Judgment intact, Mood & affect appropriate  for pt's clinical situation. Dermatologic: No rashes + ulcers noted left lateral ankle.  + cellulitis with left open wounds. Lymph : No Cervical,  or Inguinal lymphadenopathy.      CBC Lab Results  Component Value Date   WBC 10.3 11/28/2017    HGB 10.1 (L) 11/28/2017   HCT 29.9 (L) 11/28/2017   MCV 93.5 11/28/2017   PLT 248 11/28/2017    BMET    Component Value Date/Time   NA 141 11/28/2017 0425   NA 139 01/17/2014 0502   K 4.5 11/28/2017 0425   K 4.8 01/17/2014 0502   CL 98 11/28/2017 0425   CL 102 01/17/2014 0502   CO2 34 (H) 11/28/2017 0425   CO2 27 01/17/2014 0502   GLUCOSE 92 11/28/2017 0425   GLUCOSE 101 (H) 01/17/2014 0502   BUN 22 11/28/2017 0425   BUN 62 (H) 01/17/2014 0502   CREATININE 8.33 (H) 11/28/2017 0425   CREATININE 12.30 (H) 01/17/2014 0502   CALCIUM 9.6 11/28/2017 0425   CALCIUM 8.2 (L) 01/17/2014 0502   GFRNONAA 6 (L) 11/28/2017 0425   GFRNONAA 4 (L) 01/17/2014 0502   GFRAA 7 (L) 11/28/2017 0425   GFRAA 4 (L) 01/17/2014 0502   Estimated Creatinine Clearance: 7.9 mL/min (A) (by C-G formula based on SCr of 8.33 mg/dL (H)).  COAG Lab Results  Component Value Date   INR 1.18 09/28/2015   INR 1.04 09/15/2015   INR 1.0 06/11/2011     Assessment/Plan 1. Leg pain with cellulitis: Continue antibiotics, I will order a MRI to evaluate for deep infection 2. Atherosclerosis bilateral lower extremities: The patient has a strongly palpable left PT pulse and therefore his intervention is open.  No angio or CTA at this time. Continue antiplatelets 3.  End stage renal disease: Continue dialysis as ordered Nephrology on consult AV access working  Hortencia Pilar, MD  11/28/2017 9:15 PM

## 2017-11-28 NOTE — Progress Notes (Signed)
Initial Nutrition Assessment  DOCUMENTATION CODES:   Not applicable  INTERVENTION:   Nepro Shake po BID, each supplement provides 425 kcal and 19 grams protein  Ocuvite daily for wound healing (provides zinc, vitamin A, vitamin C, Vitamin E, copper, and selenium)  Rena-vite daily  Liberalize diet  NUTRITION DIAGNOSIS:   Increased nutrient needs related to wound healing, chronic illness(ESRD on HD) as evidenced by increased estimated needs.  GOAL:   Patient will meet greater than or equal to 90% of their needs  MONITOR:   PO intake, Supplement acceptance, Labs, Weight trends, Skin, I & O's  REASON FOR ASSESSMENT:   Malnutrition Screening Tool    ASSESSMENT:   67 year old male with history of end-stage renal disease on hemodialysis, chronic diastolic heart failure who recently had femoral stent placed in the left lower extremity for PAD who presents with left foot pain.   Met with pt in room today. Pt reports poor appetite and oral intake for several months pta. Pt reports his appetite is improving today. Pt reports eating about 50% of meals. Pt is willing to drink vanilla supplements. Discussed with pt the importance of adequate protein and vitamins needed with HD losses and for wound healing. RD will order Nepro and rena-vite. RD will also liberalize pt's diet to encourage oral intake. Per chart, pt with 25lb(14%) wt loss in 9 months; this is worth noting given pt's history.   Medications reviewed and include: allopurinol, phoslo, calcium gluconate, cinacalcet, plavix, lasix, heparin, renvela, tramadol  Labs reviewed: Hgb 10.1(L), Hct 29.9(L) iPTH- 553(H)- 09/2015  NUTRITION - FOCUSED PHYSICAL EXAM:    Most Recent Value  Orbital Region  No depletion  Upper Arm Region  No depletion  Thoracic and Lumbar Region  No depletion  Buccal Region  No depletion  Temple Region  No depletion  Clavicle Bone Region  Mild depletion  Clavicle and Acromion Bone Region  Mild  depletion  Scapular Bone Region  No depletion  Dorsal Hand  No depletion  Patellar Region  Moderate depletion  Anterior Thigh Region  Moderate depletion  Posterior Calf Region  Moderate depletion  Edema (RD Assessment)  Mild  Hair  Reviewed  Eyes  Reviewed  Mouth  Reviewed  Skin  Reviewed  Nails  Reviewed     Diet Order:   Diet Order           Diet Heart Room service appropriate? Yes; Fluid consistency: Thin  Diet effective now         EDUCATION NEEDS:   Education needs have been addressed  Skin:  Skin Assessment: Reviewed RN Assessment(FUll thickness wound on left lateral foot. 1.4cm x 3cm with depth undetermined due to the presence of nonviable tissue in wound bed)  Last BM:     Height:   Ht Readings from Last 1 Encounters:  11/27/17 '5\' 7"'  (1.702 m)    Weight:   Wt Readings from Last 1 Encounters:  11/28/17 160 lb 12.8 oz (72.9 kg)    Ideal Body Weight:  67 kg  BMI:  Body mass index is 25.18 kg/m.  Estimated Nutritional Needs:   Kcal:  1900-2200kcal/day   Protein:  95-109g/day   Fluid:  per MD  Koleen Distance MS, RD, LDN Pager #- [phone] Office#- [phone] After Hours Pager: [phone]

## 2017-11-28 NOTE — Progress Notes (Signed)
[place], [place] 11/28/17  Subjective:   Patient known to our practice from outpatient dialysis.  He was admitted last week for left leg pain and underwent angioplasty of left leg arteries.  He presents today for worsening left leg pain and also pain and numbness in the right leg Had his normal dialysis treatment yesterday. No shortness of breath   Objective:  Vital signs in last 24 hours:  Temp:  [97.8 F (36.6 C)-99.2 F (37.3 C)] 98 F (36.7 C) (07/23 1352) Pulse Rate:  [68-95] 68 (07/23 1352) Resp:  [16-20] 18 (07/23 0457) BP: (131-164)/(66-87) 157/74 (07/23 1352) SpO2:  [98 %-100 %] 100 % (07/23 1352) Weight:  [74.8 kg (165 lb)] 74.8 kg (165 lb) (07/22 1447)  Weight change:  Filed Weights   11/27/17 1447  Weight: 74.8 kg (165 lb)    Intake/Output:    Intake/Output Summary (Last 24 hours) at 11/28/2017 1411 Last data filed at 11/28/2017 0516 Gross per 24 hour  Intake 160 ml  Output 0 ml  Net 160 ml     Physical Exam: General:  No acute distress, laying in the bed  HEENT  anicteric, moist oral mucous membranes  Neck  supple  Pulm/lungs  normal breathing effort, clear to auscultation  CVS/Heart  regular rhythm   Abdomen:   soft, nontender   Extremities:  no peripheral edema  Neurologic:  Alert, oriented  Skin:  Left ankle ulcer  Access:  Left upper arm AVG       Basic Metabolic Panel:  Recent Labs  Lab 11/27/17 1526 11/28/17 0425  NA 138 141  K 3.9 4.5  CL 94* 98  CO2 30 34*  GLUCOSE 95 92  BUN 19 22  CREATININE 6.89* 8.33*  CALCIUM 9.2 9.6     CBC: Recent Labs  Lab 11/27/17 1526 11/28/17 0425  WBC 12.4* 10.3  NEUTROABS 10.2*  --   HGB 10.2* 10.1*  HCT 30.1* 29.9*  MCV 93.3 93.5  PLT 275 248      Lab Results  Component Value Date   HEPBSAG Negative 09/30/2015      Microbiology:  Recent Results (from the past 240 hour(s))  Culture, blood (routine x 2)     Status: None   Collection Time:  11/19/17  1:46 AM  Result Value Ref Range Status   Specimen Description BLOOD RIGHT HAND  Final   Special Requests   Final    BOTTLES DRAWN AEROBIC AND ANAEROBIC Blood Culture adequate volume   Culture   Final    NO GROWTH 5 DAYS Performed at [facility], [address] [place], [place] [address]    Report Status 11/24/2017 FINAL  Final  Culture, blood (routine x 2)     Status: None   Collection Time: 11/19/17  3:24 AM  Result Value Ref Range Status   Specimen Description BLOOD BLOOD RIGHT HAND  Final   Special Requests   Final    BOTTLES DRAWN AEROBIC AND ANAEROBIC Blood Culture adequate volume   Culture   Final    NO GROWTH 5 DAYS Performed at [facility], [address], [place],  [address]    Report Status 11/24/2017 FINAL  Final  MRSA PCR Screening     Status: None   Collection Time: 11/20/17  3:32 AM  Result Value Ref Range Status   MRSA by PCR NEGATIVE NEGATIVE Final    Comment:        The GeneXpert MRSA Assay (FDA  approved for NASAL specimens only), is one component of a comprehensive MRSA colonization surveillance program. It is not intended to diagnose MRSA infection nor to guide or monitor treatment for MRSA infections. Performed at [facility], [address] [place], [place] [address]   MRSA PCR Screening     Status: None   Collection Time: 11/27/17  6:37 PM  Result Value Ref Range Status   MRSA by PCR NEGATIVE NEGATIVE Final    Comment:        The GeneXpert MRSA Assay (FDA approved for NASAL specimens only), is one component of a comprehensive MRSA colonization surveillance program. It is not intended to diagnose MRSA infection nor to guide or monitor treatment for MRSA infections. Performed at [facility], [address] [place], [place] [address]     Coagulation Studies: No results for input(s): LABPROT, INR in the last 72 hours.  Urinalysis: No results for input(s): COLORURINE, LABSPEC,  PHURINE, GLUCOSEU, HGBUR, BILIRUBINUR, KETONESUR, PROTEINUR, UROBILINOGEN, NITRITE, LEUKOCYTESUR in the last 72 hours.  Invalid input(s): APPERANCEUR    Imaging: Dg Foot Complete Left  Result Date: 11/27/2017 CLINICAL DATA:  Left leg pain and swelling, history of left leg revascularization EXAM: LEFT FOOT - COMPLETE 3+ VIEW COMPARISON:  11/19/2017 FINDINGS: Degenerative changes of the first MTP joint are again seen. No acute fracture or dislocation is noted. Diffuse vascular calcifications are seen. No other gross soft tissue abnormality is noted. IMPRESSION: Chronic changes without acute abnormality. Electronically Signed   By: Inez Catalina M.D.   On: 11/27/2017 15:31     Medications:   . sodium chloride    . piperacillin-tazobactam (ZOSYN)  IV 3.375 g (11/28/17 1157)  . [START ON 11/29/2017] vancomycin     . allopurinol  100 mg Oral Daily  . amLODipine  10 mg Oral Daily  . atorvastatin  10 mg Oral Daily  . calcium acetate  1,334 mg Oral TID WC  . Calcium Gluconate  50 mg Oral Daily  . cinacalcet  90 mg Oral Daily  . clonazePAM  0.5 mg Oral QHS  . clopidogrel  75 mg Oral Daily  . furosemide  40 mg Oral See admin instructions  . gabapentin  200 mg Oral BID  . heparin  5,000 Units Subcutaneous Q8H  . hydrALAZINE  25 mg Oral Daily  . irbesartan  300 mg Oral Daily  . metoprolol succinate  50 mg Oral Daily  . pregabalin  50 mg Oral Daily  . sevelamer carbonate  2,400 mg Oral TID WC  . sodium chloride flush  3 mL Intravenous Q12H   sodium chloride, acetaminophen **OR** acetaminophen, colchicine, ondansetron **OR** ondansetron (ZOFRAN) IV, senna-docusate, sodium chloride flush, traMADol  Assessment/ Plan:  67 y.o. male with pmhx of ESRD, cardiomyopathy, hypertension, anemia of ckd, shpth, e. coli bacteremia 9/12, left hand inflammatory arthritis resolved, angioplasty cephalic vein 6/81, access declot 9/15. colonoscopy 02/2015, admission for chest pain 11/18/17.   [facility]  [place]/MWF/LUE AVG/EDW kg  1.  End-stage renal disease We will arrange for his routine hemodialysis on Wednesday Electrolytes and volume status are acceptable.  No acute indication for dialysis at present 2.  Anemia of chronic kidney disease We will continue Procrit with hemodialysis 3.  Secondary hyperparathyroidism We will monitor phosphorus and calcium during hospitalization 4.  Peripheral arterial disease.  Vascular surgery evaluation in progress     LOS: [place] 7/23/20192:11 PM  [place], [place]  Note: This note was prepared with  Sales executive. Any transcription errors are unintentional

## 2017-11-28 NOTE — Progress Notes (Signed)
[place] [facility] [place] NAME: Johnny [name]    MR#:  [id number]  DATE OF BIRTH:  July 07, 1950  SUBJECTIVE:  Left heel ulcer  REVIEW OF SYSTEMS:    Review of Systems  Constitutional: Negative for fever, chills weight loss HENT: Negative for ear pain, nosebleeds, congestion, facial swelling, rhinorrhea, neck pain, neck stiffness and ear discharge.   Respiratory: Negative for cough, shortness of breath, wheezing  Cardiovascular: Negative for chest pain, palpitations and leg swelling.  Gastrointestinal: Negative for heartburn, abdominal pain, vomiting, diarrhea or consitpation Genitourinary: Negative for dysuria, urgency, frequency, hematuria Musculoskeletal: Negative for back pain or joint pain Neurological: Negative for dizziness, seizures, syncope, focal weakness,  numbness and headaches.  Hematological: Does not bruise/bleed easily.  Psychiatric/Behavioral: Negative for hallucinations, confusion, dysphoric mood    Tolerating Diet: yes      DRUG ALLERGIES:   Allergies  Allergen Reactions  . Shellfish Allergy Anaphylaxis    VITALS:  Blood pressure 131/66, pulse 70, temperature 97.8 F (36.6 C), temperature source Oral, resp. rate 18, height 5\' 7"  (1.702 m), weight 74.8 kg (165 lb), SpO2 98 %.  PHYSICAL EXAMINATION:  Constitutional: Appears well-developed and well-nourished. No distress. HENT: Normocephalic. [name] Oropharynx is clear and moist.  Eyes: Conjunctivae and EOM are normal. PERRLA, no scleral icterus.  Neck: Normal ROM. Neck supple. No JVD. No tracheal deviation. CVS: RRR, S1/S2 +, no murmurs, no gallops, no carotid bruit.  Pulmonary: Effort and breath sounds normal, no stridor, rhonchi, wheezes, rales.  Abdominal: Soft. BS +,  no distension, tenderness, rebound or guarding.  Musculoskeletal: Normal range of motion. No edema and no tenderness.  Neuro: Alert. CN 2-12 grossly intact. No focal deficits. Skin: small left lateral  heel ulcer noted Psychiatric: Normal mood and affect.      LABORATORY PANEL:   CBC Recent Labs  Lab 11/28/17 0425  WBC 10.3  HGB 10.1*  HCT 29.9*  PLT 248   ------------------------------------------------------------------------------------------------------------------  Chemistries  Recent Labs  Lab 11/27/17 1526 11/28/17 0425  NA 138 141  K 3.9 4.5  CL 94* 98  CO2 30 34*  GLUCOSE 95 92  BUN 19 22  CREATININE 6.89* 8.33*  CALCIUM 9.2 9.6  AST 21  --   ALT 5  --   ALKPHOS 51  --   BILITOT 0.7  --    ------------------------------------------------------------------------------------------------------------------  Cardiac Enzymes No results for input(s): TROPONINI in the last 168 hours. ------------------------------------------------------------------------------------------------------------------  RADIOLOGY:  Dg Foot Complete Left  Result Date: 11/27/2017 CLINICAL DATA:  Left leg pain and swelling, history of left leg revascularization EXAM: LEFT FOOT - COMPLETE 3+ VIEW COMPARISON:  11/19/2017 FINDINGS: Degenerative changes of the first MTP joint are again seen. No acute fracture or dislocation is noted. Diffuse vascular calcifications are seen. No other gross soft tissue abnormality is noted. IMPRESSION: Chronic changes without acute abnormality. Electronically Signed   By: Inez Catalina M.D.   On: 11/27/2017 15:31     ASSESSMENT AND PLAN:   67 year old male with history of end-stage renal disease on hemodialysis, chronic diastolic heart failure who recently had femoral stent placed in the left lower extremity for PAD who presents with left foot pain.  1.  Left foot nonhealing ulcer: Dietary and vascular surgery consultation Start Zosyn and vancomycin  2.  End-stage renal disease on hemodialysis: Nephrology consultation  3.  PAD: Continue statin and Plavix  4.  Essential hypertension: Continue Norvasc, hydralazine, Avapro 5.  Chronic diastolic heart  failure with  preserved ejection fraction without signs of exacerbation  Management plans discussed with the patient and he is in agreement.  CODE STATUS: Full  TOTAL TIME TAKING CARE OF THIS PATIENT: 27 minutes.     POSSIBLE D/C 1 to 2 days, DEPENDING ON CLINICAL CONDITION.   Reanna Scoggin M.D on 11/28/2017 at 12:50 PM  Between 7am to 6pm - Pager - [phone] After 6pm go to [email or web address] - password [email or web address] [facility]  Office  [phone]  CC: Primary care physician; [name], MD  Note: This dictation was prepared with Dragon dictation along with smaller phrase technology. Any transcriptional errors that result from this process are unintentional.

## 2017-11-28 NOTE — Care Management (Signed)
Johnny [name] dialysis liaison notified of admission.    

## 2017-11-28 NOTE — Consult Note (Signed)
[facility] Nurse wound consult note Reason for Consult:FUll thickness wound on left lateral foot.  Patient is status post stent placement by Vascular surgery and that service has been requested simutaneously to see patient.  Wound is painful. Wound type: history of arterial insufficiency Pressure Injury POA: NA Measurement: 1.4cm x 3cm with depth undetermined due to the presence of nonviable tissue in wound bed.  Wound IQN:VVYX red wound bed with no moisture Drainage (amount, consistency, odor)  Periwound: Dry, flaking tissue in the periwound area.  Too painful to patient to have this writer gently remove. Dressing procedure/placement/frequency: A conservative POC will be placed until Vascular surgery sees using twice daily xeroform antimicrobial and nonadherent dressings with light securement.   [facility] nursing team will not follow, but will remain available to this patient, the nursing and medical teams.  Please re-consult if needed. Thanks, Maudie Flakes, MSN, RN, [place], Ar[facility] Abbott  Pager# [phone]

## 2017-11-28 NOTE — Consult Note (Signed)
ORTHOPAEDIC CONSULTATION  REQUESTING PHYSICIAN: Bettey Costa, MD  Chief Complaint: Left heel ulcer  HPI: Johnny [name]. is a 67 y.o. male who complains of left heel ulcer.  Has been followed by vascular surgery.  He had intervention recently with continued pain into his left foot.  Podiatry asked to give opinion in regards to the ulceration on the lateral aspect of his left heel.  Past Medical History:  Diagnosis Date  . Anemia   . CHF (congestive heart failure) ([place])   . Chronic kidney disease   . Gout   . Hyperlipidemia   . Hypertension    Past Surgical History:  Procedure Laterality Date  . A/V SHUNTOGRAM Left 06/21/2017   Procedure: A/V SHUNTOGRAM;  Surgeon: Katha Cabal, MD;  Location: [facility];  Service: Cardiovascular;  Laterality: Left;  . AV FISTULA PLACEMENT Left 09/18/2015   Procedure: INSERTION OF ARTERIOVENOUS (AV) GORE-TEX GRAFT ARM ( BRACH/AXILLARY GRAFT W/ INSTANT STICK GRAFT );  Surgeon: Katha Cabal, MD;  Location: [facility];  Service: Vascular;  Laterality: Left;  . DIALYSIS FISTULA CREATION    . LOWER EXTREMITY ANGIOGRAPHY Left 11/16/2017   Procedure: LOWER EXTREMITY ANGIOGRAPHY;  Surgeon: Algernon Huxley, MD;  Location: [facility];  Service: Cardiovascular;  Laterality: Left;  . PERIPHERAL VASCULAR CATHETERIZATION Left 09/01/2015   Procedure: A/V Shuntogram/Fistulagram;  Surgeon: Katha Cabal, MD;  Location: [facility];  Service: Cardiovascular;  Laterality: Left;  . PERIPHERAL VASCULAR CATHETERIZATION N/A 09/30/2015   Procedure: A/V Shuntogram/Fistulagram with perm cathether removal;  Surgeon: Algernon Huxley, MD;  Location: [facility];  Service: Cardiovascular;  Laterality: N/A;  . PERIPHERAL VASCULAR CATHETERIZATION Left 09/30/2015   Procedure: A/V Shunt Intervention;  Surgeon: Algernon Huxley, MD;  Location: [facility];  Service: Cardiovascular;  Laterality: Left;  . PERIPHERAL VASCULAR CATHETERIZATION  Left 12/03/2015   Procedure: Thrombectomy;  Surgeon: Algernon Huxley, MD;  Location: [facility];  Service: Cardiovascular;  Laterality: Left;  . PERIPHERAL VASCULAR CATHETERIZATION Left 01/28/2016   Procedure: Thrombectomy;  Surgeon: Algernon Huxley, MD;  Location: [facility];  Service: Cardiovascular;  Laterality: Left;  . PERIPHERAL VASCULAR CATHETERIZATION N/A 01/28/2016   Procedure: A/V Shuntogram/Fistulagram;  Surgeon: Algernon Huxley, MD;  Location: [facility];  Service: Cardiovascular;  Laterality: N/A;   Social History   Socioeconomic History  . Marital status: Single    Spouse name: Not on file  . Number of children: Not on file  . Years of education: Not on file  . Highest education level: Not on file  Occupational History  . Occupation: retired  Scientific laboratory technician  . Financial resource strain: Not on file  . Food insecurity:    Worry: Not on file    Inability: Not on file  . Transportation needs:    Medical: Not on file    Non-medical: Not on file  Tobacco Use  . Smoking status: Former Research scientist (life sciences)  . Smokeless tobacco: Never Used  Substance and Sexual Activity  . Alcohol use: No  . Drug use: No  . Sexual activity: Not on file  Lifestyle  . Physical activity:    Days per week: Not on file    Minutes per session: Not on file  . Stress: Not on file  Relationships  . Social connections:    Talks on phone: Not on file    Gets together: Not on file    Attends religious  service: Not on file    Active member of club or organization: Not on file    Attends meetings of clubs or organizations: Not on file    Relationship status: Not on file  Other Topics Concern  . Not on file  Social History Narrative  . Not on file   Family History  Problem Relation Age of Onset  . Hypertension Unknown   . Heart disease Unknown    Allergies  Allergen Reactions  . Shellfish Allergy Anaphylaxis   Prior to Admission medications   Medication Sig Start Date End Date  Taking? Authorizing Provider  allopurinol (ZYLOPRIM) 100 MG tablet Take 100 mg by mouth daily.   Yes [provider]  amLODipine (NORVASC) 10 MG tablet Take 10 mg by mouth daily.    Yes [provider]  atorvastatin (LIPITOR) 10 MG tablet Take 1 tablet by mouth daily. 11/23/17  Yes [provider]  calcium acetate (PHOSLO) 667 MG capsule Take 1,334 mg by mouth 3 (three) times daily with meals.   Yes [provider]  Calcium Gluconate 50 MG TABS Take 50 mg by mouth 3 (three) times daily.  05/19/11  Yes [provider]  cinacalcet (SENSIPAR) 90 MG tablet Take 90 mg by mouth daily.    Yes [provider]  clonazePAM (KLONOPIN) 0.5 MG tablet Take 0.5 mg by mouth at bedtime.   Yes [provider]  clopidogrel (PLAVIX) 75 MG tablet Take 1 tablet (75 mg total) by mouth daily. 12/03/15  Yes Dew, Erskine Squibb, MD  furosemide (LASIX) 40 MG tablet Take 40 mg by mouth daily.    Yes [provider]  gabapentin (NEURONTIN) 100 MG capsule Take 200 mg by mouth 2 (two) times daily.   Yes [provider]  hydrALAZINE (APRESOLINE) 25 MG tablet Take 25 mg by mouth daily.    Yes [provider]  irbesartan (AVAPRO) 300 MG tablet Take 300 mg by mouth daily.    Yes [provider]  pregabalin (LYRICA) 50 MG capsule Take 50 mg by mouth daily. After each dialysis session   Yes [provider]  sevelamer carbonate (RENVELA) 800 MG tablet Take 2,400 mg by mouth 3 (three) times daily with meals.    Yes [provider]  colchicine 0.6 MG tablet Take 0.6 mg by mouth daily as needed (for gout flares).     [provider]  lidocaine-prilocaine (EMLA) cream Apply 1 application topically as needed (port access).    [provider]  traMADol Veatrice Bourbon) 50 MG tablet One to Two Tabs By Mouth Every Six Hours As Needed For Pain 11/23/17   Ste[place]ayer, Janalyn Harder, PA-C   Dg Foot Complete Left  Result Date:  11/27/2017 CLINICAL DATA:  Left leg pain and swelling, history of left leg revascularization EXAM: LEFT FOOT - COMPLETE 3+ VIEW COMPARISON:  11/19/2017 FINDINGS: Degenerative changes of the first MTP joint are again seen. No acute fracture or dislocation is noted. Diffuse vascular calcifications are seen. No other gross soft tissue abnormality is noted. IMPRESSION: Chronic changes without acute abnormality. Electronically Signed   By: Inez Catalina M.D.   On: 11/27/2017 15:31    Positive ROS: All other systems have been reviewed and were otherwise negative with the exception of those mentioned in the HPI and as above.  12 point ROS was performed.  Physical Exam: General: Alert and oriented.  No apparent distress.  Vascular:  Left foot:Dorsalis Pedis:  thready Posterior Tibial:  present  Neuro:  Gross sensations intact but diminished light touch and protective sensation.  Derm: On the lateral aspect of the left heel was superficial loose skin overlying a ulcer on the lateral heel.  The central aspect of the ulcer had scant necrotic tissue measured approximately 1 cm in diameter.  Surrounding healthy granular tissue was noted.  No purulence.  This did not probe deep.  No surrounding erythema.  Ortho/MS: He is guarded with range of motion and pressure to his left lower extremity.  He has had continued pain to this region.  Assessment: Superficial necrotic lateral calcaneal ulceration  Plan: The central aspect of the ulcer measures approximately 1 cm in diameter.  I would recommend starting Santyl ointment to superficially enzymatically debride the central aspect.  He is awaiting further evaluation by vascular surgery.  He had a palpable posterior tibial pulse.  His foot was warm.  He was not terribly painful with palpation to the ulceration itself.  Continue to bandage on a regular basis.  I am happy to follow-up outpatient if needed.    Elesa Hacker, DPM Cell [phone]   11/28/2017 5:22 PM

## 2017-11-29 LAB — CBC
HEMATOCRIT: 27.4 % — AB (ref 40.0–52.0)
HEMOGLOBIN: 9.5 g/dL — AB (ref 13.0–18.0)
MCH: 32.6 pg (ref 26.0–34.0)
MCHC: 34.8 g/dL (ref 32.0–36.0)
MCV: 93.6 fL (ref 80.0–100.0)
Platelets: 230 10*3/uL (ref 150–440)
RBC: 2.93 MIL/uL — ABNORMAL LOW (ref 4.40–5.90)
RDW: 14.9 % — ABNORMAL HIGH (ref 11.5–14.5)
WBC: 10.6 10*3/uL (ref 3.8–10.6)

## 2017-11-29 LAB — RENAL FUNCTION PANEL
ANION GAP: 11 (ref 5–15)
Albumin: 2.9 g/dL — ABNORMAL LOW (ref 3.5–5.0)
BUN: 34 mg/dL — ABNORMAL HIGH (ref 8–23)
CO2: 32 mmol/L (ref 22–32)
Calcium: 9.2 mg/dL (ref 8.9–10.3)
Chloride: 97 mmol/L — ABNORMAL LOW (ref 98–111)
Creatinine, Ser: 10.65 mg/dL — ABNORMAL HIGH (ref 0.61–1.24)
GFR calc Af Amer: 5 mL/min — ABNORMAL LOW (ref >60)
GFR calc non Af Amer: 4 mL/min — ABNORMAL LOW (ref >60)
GLUCOSE: 83 mg/dL (ref 70–99)
POTASSIUM: 4.4 mmol/L (ref 3.5–5.1)
Phosphorus: 6.3 mg/dL — ABNORMAL HIGH (ref 2.5–4.6)
SODIUM: 140 mmol/L (ref 135–145)

## 2017-11-29 LAB — HIV ANTIBODY (ROUTINE TESTING W REFLEX): HIV SCREEN 4TH GENERATION: NONREACTIVE

## 2017-11-29 MED ORDER — HEPARIN SODIUM (PORCINE) 1000 UNIT/ML DIALYSIS: 20.0000 [IU]/kg | INTRAMUSCULAR | Status: DC | PRN

## 2017-11-29 MED ORDER — COLLAGENASE 250 UNIT/GM EX OINT: TOPICAL_OINTMENT | Freq: Every day | CUTANEOUS | 0 refills | Status: DC

## 2017-11-29 MED ORDER — IRBESARTAN 150 MG PO TABS
300.0000 mg | ORAL_TABLET | Freq: Every day | ORAL | Status: DC
  Administered 2017-11-29 – 2017-11-30 (×2): 300 mg via ORAL
  Filled 2017-11-29 (×3): qty 2

## 2017-11-29 MED ORDER — TRAMADOL HCL 50 MG PO TABS: ORAL_TABLET | ORAL | 0 refills | Status: DC

## 2017-11-29 MED ORDER — AMOXICILLIN-POT CLAVULANATE 500-125 MG PO TABS: 1.0000 | ORAL_TABLET | Freq: Every day | ORAL | 0 refills | Status: AC

## 2017-11-29 MED ORDER — EPOETIN ALFA 10000 UNIT/ML IJ SOLN
4000.0000 [IU] | INTRAMUSCULAR | Status: DC
  Administered 2017-11-29 – 2017-12-01 (×2): 4000 [IU] via INTRAVENOUS

## 2017-11-29 NOTE — Discharge Summary (Addendum)
[facility] [place] NAME: Johnny [name]    MR#:  [id number]  DATE OF BIRTH:  July 01, 1950  DATE OF ADMISSION:  11/27/2017 ADMITTING PHYSICIAN: Saundra Shelling, MD  DATE OF DISCHARGE: 12/01/2017 PRIMARY CARE PHYSICIAN: [name], MD    ADMISSION DIAGNOSIS:  Left leg pain [M79.605] Cellulitis of toe of left foot [T01.779] Ulcer of foot, limited to breakdown of skin, unspecified laterality ([place]) [L97.501]  DISCHARGE DIAGNOSIS:  Active Problems:   Cellulitis   SECONDARY DIAGNOSIS:   Past Medical History:  Diagnosis Date  . Anemia   . CHF (congestive heart failure) ([place])   . Chronic kidney disease   . Gout   . Hyperlipidemia   . Hypertension     HOSPITAL COURSE:   67 year old male with history of end-stage renal disease on hemodialysis, chronic diastolic heart failure who recently had femoral stent placed in the left lower extremity for PAD who presents with left foot pain.  1.  Left foot nonhealing ulcer: MRI shows no evidence of osteomyelitis.  Patient evaluated by podiatry and vascular surgery. Patient will be discharged on oral AUGMENTIN. He will have outpatient follow-up with vascular surgery and podiatry. Continue Santyl ointment to superficially enzymatically debride the central aspect    2.  End-stage renal disease on hemodialysis: He will continue on outpatient regimen of dialysis  3.  PAD: Continue statin and Plavix  4.  Essential hypertension: Continue Norvasc, hydralazine, Avapro 5.  Chronic diastolic heart failure with preserved ejection fraction without signs of exacerbation     DISCHARGE CONDITIONS AND DIET:   Stable  Renal diet  CONSULTS OBTAINED:  Treatment Team:  Murlean Iba, MD Samara Deist, DPM  DRUG ALLERGIES:   Allergies  Allergen Reactions  . Shellfish Allergy Anaphylaxis    DISCHARGE MEDICATIONS:   Allergies as of 12/01/2017      Reactions   Shellfish Allergy  Anaphylaxis      Medication List    TAKE these medications   allopurinol 100 MG tablet Commonly known as:  ZYLOPRIM Take 100 mg by mouth daily.   amLODipine 10 MG tablet Commonly known as:  NORVASC Take 10 mg by mouth daily.   amoxicillin-clavulanate 500-125 MG tablet Commonly known as:  AUGMENTIN Take 1 tablet (500 mg total) by mouth daily for 7 days.   atorvastatin 10 MG tablet Commonly known as:  LIPITOR Take 1 tablet by mouth daily.   calcium acetate 667 MG capsule Commonly known as:  PHOSLO Take 1,334 mg by mouth 3 (three) times daily with meals.   Calcium Gluconate 50 MG Tabs Take 50 mg by mouth 3 (three) times daily.   cinacalcet 90 MG tablet Commonly known as:  SENSIPAR Take 90 mg by mouth daily.   clonazePAM 0.5 MG tablet Commonly known as:  KLONOPIN Take 0.5 mg by mouth at bedtime.   clopidogrel 75 MG tablet Commonly known as:  PLAVIX Take 1 tablet (75 mg total) by mouth daily.   colchicine 0.6 MG tablet Take 0.6 mg by mouth daily as needed (for gout flares).   collagenase ointment Commonly known as:  SANTYL Apply topically daily.   furosemide 40 MG tablet Commonly known as:  LASIX Take 40 mg by mouth daily.   gabapentin 100 MG capsule Commonly known as:  NEURONTIN Take 200 mg by mouth 2 (two) times daily.   hydrALAZINE 25 MG tablet Commonly known as:  APRESOLINE Take 25 mg by mouth daily.   irbesartan 300 MG tablet  Commonly known as:  AVAPRO Take 300 mg by mouth daily.   lidocaine-prilocaine cream Commonly known as:  EMLA Apply 1 application topically as needed (port access).   ondansetron 4 MG tablet Commonly known as:  ZOFRAN Take 1 tablet (4 mg total) by mouth every 6 (six) hours as needed for nausea.   pregabalin 50 MG capsule Commonly known as:  LYRICA Take 50 mg by mouth daily. After each dialysis session   sevelamer carbonate 800 MG tablet Commonly known as:  RENVELA Take 2,400 mg by mouth 3 (three) times daily with  meals.   traMADol 50 MG tablet Commonly known as:  ULTRAM One to Two Tabs By Mouth Every Six Hours As Needed For Pain            Discharge Care Instructions  (From admission, onward)        Start     Ordered   11/29/17 0000  Discharge wound care:    Comments:  Santyl ointment to superficially enzymatically debride the central aspect   11/29/17 1136        Today   CHIEF COMPLAINT:  Pain better    VITAL SIGNS:  Blood pressure (!) 169/81, pulse 81, temperature 97.9 F (36.6 C), temperature source Oral, resp. rate 18, height 5\' 7"  (1.702 m), weight 154 lb 11.2 oz (70.2 kg), SpO2 100 %.   REVIEW OF SYSTEMS:  Review of Systems  Constitutional: Negative.  Negative for chills, fever and malaise/fatigue.  HENT: Negative.  Negative for ear discharge, ear pain, hearing loss, nosebleeds and sore throat.   Eyes: Negative.  Negative for blurred vision and pain.  Respiratory: Negative.  Negative for cough, hemoptysis, shortness of breath and wheezing.   Cardiovascular: Negative.  Negative for chest pain, palpitations and leg swelling.  Gastrointestinal: Negative.  Negative for abdominal pain, blood in stool, diarrhea, nausea and vomiting.  Genitourinary: Negative.  Negative for dysuria.  Musculoskeletal: Positive for joint pain. Negative for back pain.  Skin: Negative.   Neurological: Negative for dizziness, tremors, speech change, focal weakness, seizures and headaches.  Endo/Heme/Allergies: Negative.  Does not bruise/bleed easily.  Psychiatric/Behavioral: Negative.  Negative for depression, hallucinations and suicidal ideas.     PHYSICAL EXAMINATION:  GENERAL:  67 y.o.-year-old patient lying in the bed with no acute distress.  NECK:  Supple, no jugular venous distention. No thyroid enlargement, no tenderness.  LUNGS: Normal breath sounds bilaterally, no wheezing, rales,rhonchi  No use of accessory muscles of respiration.  CARDIOVASCULAR: S1, S2 normal. No murmurs, rubs,  or gallops.  ABDOMEN: Soft, non-tender, non-distended. Bowel sounds present. No organomegaly or mass.  EXTREMITIES: No pedal edema, cyanosis, or clubbing.  PSYCHIATRIC: The patient is alert and oriented x 3.  SKIN: small ulcer no discharge.   DATA REVIEW:   CBC Recent Labs  Lab 11/29/17 0802  WBC 10.6  HGB 9.5*  HCT 27.4*  PLT 230    Chemistries  Recent Labs  Lab 11/27/17 1526  11/29/17 0802  NA 138   < > 140  K 3.9   < > 4.4  CL 94*   < > 97*  CO2 30   < > 32  GLUCOSE 95   < > 83  BUN 19   < > 34*  CREATININE 6.89*   < > 10.65*  CALCIUM 9.2   < > 9.2  AST 21  --   --   ALT 5  --   --   ALKPHOS 51  --   --  BILITOT 0.7  --   --    < > = values in this interval not displayed.    Cardiac Enzymes No results for input(s): TROPONINI in the last 168 hours.  Microbiology Results  @[email or web address] @  RADIOLOGY:  Dg Abd 1 View  Result Date: 11/30/2017 CLINICAL DATA:  Nausea, vomiting since yesterday EXAM: ABDOMEN - 1 VIEW COMPARISON:  None. FINDINGS: There is a non obstructive bowel gas pattern. No supine evidence of free air. No organomegaly or suspicious calcification. No acute bony abnormality. Vascular calcifications. No visible aneurysm. IMPRESSION: No acute findings.  Extensive vascular calcifications. Electronically Signed   By: Rolm Baptise M.D.   On: 11/30/2017 18:28      Allergies as of 12/01/2017      Reactions   Shellfish Allergy Anaphylaxis      Medication List    TAKE these medications   allopurinol 100 MG tablet Commonly known as:  ZYLOPRIM Take 100 mg by mouth daily.   amLODipine 10 MG tablet Commonly known as:  NORVASC Take 10 mg by mouth daily.   amoxicillin-clavulanate 500-125 MG tablet Commonly known as:  AUGMENTIN Take 1 tablet (500 mg total) by mouth daily for 7 days.   atorvastatin 10 MG tablet Commonly known as:  LIPITOR Take 1 tablet by mouth daily.   calcium acetate 667 MG capsule Commonly known as:  PHOSLO Take 1,334 mg by  mouth 3 (three) times daily with meals.   Calcium Gluconate 50 MG Tabs Take 50 mg by mouth 3 (three) times daily.   cinacalcet 90 MG tablet Commonly known as:  SENSIPAR Take 90 mg by mouth daily.   clonazePAM 0.5 MG tablet Commonly known as:  KLONOPIN Take 0.5 mg by mouth at bedtime.   clopidogrel 75 MG tablet Commonly known as:  PLAVIX Take 1 tablet (75 mg total) by mouth daily.   colchicine 0.6 MG tablet Take 0.6 mg by mouth daily as needed (for gout flares).   collagenase ointment Commonly known as:  SANTYL Apply topically daily.   furosemide 40 MG tablet Commonly known as:  LASIX Take 40 mg by mouth daily.   gabapentin 100 MG capsule Commonly known as:  NEURONTIN Take 200 mg by mouth 2 (two) times daily.   hydrALAZINE 25 MG tablet Commonly known as:  APRESOLINE Take 25 mg by mouth daily.   irbesartan 300 MG tablet Commonly known as:  AVAPRO Take 300 mg by mouth daily.   lidocaine-prilocaine cream Commonly known as:  EMLA Apply 1 application topically as needed (port access).   ondansetron 4 MG tablet Commonly known as:  ZOFRAN Take 1 tablet (4 mg total) by mouth every 6 (six) hours as needed for nausea.   pregabalin 50 MG capsule Commonly known as:  LYRICA Take 50 mg by mouth daily. After each dialysis session   sevelamer carbonate 800 MG tablet Commonly known as:  RENVELA Take 2,400 mg by mouth 3 (three) times daily with meals.   traMADol 50 MG tablet Commonly known as:  ULTRAM One to Two Tabs By Mouth Every Six Hours As Needed For Pain            Discharge Care Instructions  (From admission, onward)        Start     Ordered   11/29/17 0000  Discharge wound care:    Comments:  Santyl ointment to superficially enzymatically debride the central aspect   11/29/17 1136         Management plans discussed with the  patient and he is in agreement. Stable for discharge snf  Patient should follow up with pcp  CODE STATUS:     Code  Status Orders  (From admission, onward)        Start     Ordered   11/27/17 1832  Full code  Continuous     11/27/17 1831    Code Status History    Date Active Date Inactive Code Status Order ID Comments User Context   11/19/2017 0507 11/21/2017 0009 Full Code [id number]  Harrie Foreman, MD Inpatient   11/16/2017 1205 11/16/2017 1717 Full Code [id number]  Algernon Huxley, MD Inpatient   01/28/2016 1050 01/28/2016 1456 Full Code [id number]  Algernon Huxley, MD Inpatient   09/28/2015 2306 10/02/2015 1738 Full Code [id number]  Lance Coon, MD Inpatient    Advance Directive Documentation     Most Recent Value  Type of Advance Directive  Healthcare Power of Attorney  Pre-existing out of facility DNR order (yellow form or pink MOST form)  -  "MOST" Form in Place?  -      TOTAL TIME TAKING CARE OF THIS PATIENT: 38 minutes.    Note: This dictation was prepared with Dragon dictation along with smaller phrase technology. Any transcriptional errors that result from this process are unintentional.  Natalya Domzalski M.D on 12/01/2017 at 11:02 AM  Between 7am to 6pm - Pager - [phone] After 6pm go to [email or web address] - password [email or web address] [facility]  Office  [phone]  CC: Primary care physician; [name], MD

## 2017-11-29 NOTE — Progress Notes (Signed)
[facility] [place] NAME: Johnny [name]    MR#:  [id number]  DATE OF BIRTH:  1951/01/30  SUBJECTIVE:  Patient seen and dialysis.  Pain has improved  REVIEW OF SYSTEMS:    Review of Systems  Constitutional: Negative for fever, chills weight loss HENT: Negative for ear pain, nosebleeds, congestion, facial swelling, rhinorrhea, neck pain, neck stiffness and ear discharge.   Respiratory: Negative for cough, shortness of breath, wheezing  Cardiovascular: Negative for chest pain, palpitations and leg swelling.  Gastrointestinal: Negative for heartburn, abdominal pain, vomiting, diarrhea or consitpation Genitourinary: Negative for dysuria, urgency, frequency, hematuria Musculoskeletal: Negative for back pain or joint pain Neurological: Negative for dizziness, seizures, syncope, focal weakness,  numbness and headaches.  Hematological: Does not bruise/bleed easily.  Psychiatric/Behavioral: Negative for hallucinations, confusion, dysphoric mood    Tolerating Diet: yes      DRUG ALLERGIES:   Allergies  Allergen Reactions  . Shellfish Allergy Anaphylaxis    VITALS:  Blood pressure (!) 112/58, pulse 79, temperature 98.1 F (36.7 C), temperature source Oral, resp. rate 16, height 5\' 7"  (1.702 m), weight 73.1 kg (161 lb 2.5 oz), SpO2 100 %.  PHYSICAL EXAMINATION:  Constitutional: Appears well-developed and well-nourished. No distress. HENT: Normocephalic. [name] Oropharynx is clear and moist.  Eyes: Conjunctivae and EOM are normal. PERRLA, no scleral icterus.  Neck: Normal ROM. Neck supple. No JVD. No tracheal deviation. CVS: RRR, S1/S2 +, no murmurs, no gallops, no carotid bruit.  Pulmonary: Effort and breath sounds normal, no stridor, rhonchi, wheezes, rales.  Abdominal: Soft. BS +,  no distension, tenderness, rebound or guarding.  Musculoskeletal: Normal range of motion. No edema and no tenderness.  Neuro: Alert. CN 2-12 grossly intact. No  focal deficits. Skin: small left lateral heel ulcer noted Psychiatric: Normal mood and affect.      LABORATORY PANEL:   CBC Recent Labs  Lab 11/29/17 0802  WBC 10.6  HGB 9.5*  HCT 27.4*  PLT 230   ------------------------------------------------------------------------------------------------------------------  Chemistries  Recent Labs  Lab 11/27/17 1526  11/29/17 0802  NA 138   < > 140  K 3.9   < > 4.4  CL 94*   < > 97*  CO2 30   < > 32  GLUCOSE 95   < > 83  BUN 19   < > 34*  CREATININE 6.89*   < > 10.65*  CALCIUM 9.2   < > 9.2  AST 21  --   --   ALT 5  --   --   ALKPHOS 51  --   --   BILITOT 0.7  --   --    < > = values in this interval not displayed.   ------------------------------------------------------------------------------------------------------------------  Cardiac Enzymes No results for input(s): TROPONINI in the last 168 hours. ------------------------------------------------------------------------------------------------------------------  RADIOLOGY:  Mr Ankle Left Wo Contrast  Result Date: 11/29/2017 CLINICAL DATA:  Nonhealing left heel ulcer. Evaluate for osteomyelitis. Recent left femoral stent placement for arterial occlusion. EXAM: MRI OF THE LEFT ANKLE WITHOUT CONTRAST TECHNIQUE: Multiplanar, multisequence MR imaging of the ankle was performed. No intravenous contrast was administered. COMPARISON:  Left foot x-rays from yesterday. FINDINGS: TENDONS Peroneal: Peroneal longus tendon intact. Peroneal brevis intact. Posteromedial: Posterior tibial tendon intact. Flexor hallucis longus tendon intact. Flexor digitorum longus tendon intact. Anterior: Tibialis anterior tendon intact. Extensor hallucis longus tendon intact Extensor digitorum longus tendon intact. Achilles:  Intact. Mild thickening of the Achilles tendon. Plantar Fascia: Intact. LIGAMENTS Lateral:  Anterior talofibular ligament intact. Calcaneofibular ligament intact. Posterior talofibular  ligament intact. Anterior and posterior tibiofibular ligaments intact. Medial: Deltoid ligament intact. Spring ligament intact. Lisfranc ligament intact. CARTILAGE Ankle Joint: No joint effusion. Normal ankle mortise. No chondral defect. Subtalar Joints/Sinus Tarsi: Normal subtalar joints. No subtalar joint effusion. Normal sinus tarsi. Bones: No marrow signal abnormality.  No fracture or dislocation. Soft Tissue: Increased T2 signal within the intrinsic muscles of the foot, nonspecific, but possibly related to reperfusion injury. No fluid collection or hematoma. No soft tissue mass. Mild soft tissue swelling over the medial and lateral malleoli. IMPRESSION: 1.  No acute osseous abnormality.  No evidence of osteomyelitis. 2. Mild Achilles tendinosis. 3. Increased edema within the intrinsic muscles of the foot is nonspecific but may be related to reperfusion injury given clinical history. Electronically Signed   By: Titus Dubin M.D.   On: 11/29/2017 10:14   Dg Foot Complete Left  Result Date: 11/27/2017 CLINICAL DATA:  Left leg pain and swelling, history of left leg revascularization EXAM: LEFT FOOT - COMPLETE 3+ VIEW COMPARISON:  11/19/2017 FINDINGS: Degenerative changes of the first MTP joint are again seen. No acute fracture or dislocation is noted. Diffuse vascular calcifications are seen. No other gross soft tissue abnormality is noted. IMPRESSION: Chronic changes without acute abnormality. Electronically Signed   By: Inez Catalina M.D.   On: 11/27/2017 15:31     ASSESSMENT AND PLAN:   67 year old male with history of end-stage renal disease on hemodialysis, chronic diastolic heart failure who recently had femoral stent placed in the left lower extremity for PAD who presents with left foot pain.  1.  Left foot nonhealing ulcer: MRI shows no evidence of osteomyelitis.  Patient evaluated by podiatry and vascular surgery. Patient can be changed to oral antibiotics upon discharge.. Outpatient  follow-up with vascular surgery and podiatry. Continue Santyl ointment to superficially enzymatically debride the central aspect    2.  End-stage renal disease on hemodialysis: He will continue on outpatient regimen of dialysis  3.  PAD: Continue statin and Plavix  4.  Essential hypertension: Continue Norvasc, hydralazine, Avapro 5.  Chronic diastolic heart failure with preserved ejection fraction without signs of exacerbation   PT consultation for discharge planning Management plans discussed with the patient and he is in agreement.  CODE STATUS: Full  TOTAL TIME TAKING CARE OF THIS PATIENT: 24 minutes.     POSSIBLE D/C today, DEPENDING ON CLINICAL CONDITION.   Johnny [name] M.D on 11/29/2017 at 11:31 AM  Between 7am to 6pm - Pager - [phone] After 6pm go to [email or web address] - password [email or web address] [facility]  Office  [phone]  CC: Primary care physician; [name], MD  Note: This dictation was prepared with Dragon dictation along with smaller phrase technology. Any transcriptional errors that result from this process are unintentional.

## 2017-11-29 NOTE — Progress Notes (Signed)
Post HD Assessment    11/29/17 1015  Neurological  Level of Consciousness Alert  Orientation Level Oriented X4  Respiratory  Respiratory Pattern Regular;Unlabored  Chest Assessment Chest expansion symmetrical  Bilateral Breath Sounds Diminished  Cough None  Cardiac  Pulse Regular  Heart Sounds S1, S2  Vascular  R Radial Pulse +2  L Radial Pulse +2  Edema Generalized  Generalized Edema +1  Psychosocial  Psychosocial (WDL) WDL

## 2017-11-29 NOTE — Progress Notes (Signed)
[facility], [place] 11/29/17  Subjective:   Seen during HD Tolerating well Reports nausea this morning. Had small amount of vomiting   HEMODIALYSIS FLOWSHEET:  Blood Flow Rate (mL/min): 400 mL/min     Objective:  Vital signs in last 24 hours:  Temp:  [97.8 F (36.6 C)-98.4 F (36.9 C)] 97.8 F (36.6 C) (07/24 0700) Pulse Rate:  [58-78] 72 (07/24 0753) Resp:  [15-20] 16 (07/24 0753) BP: (126-157)/(61-80) 151/80 (07/24 0746) SpO2:  [96 %-100 %] 99 % (07/24 0753) Weight:  [72.9 kg (160 lb 12.8 oz)-73.1 kg (161 lb 2.5 oz)] 73.1 kg (161 lb 2.5 oz) (07/24 0700)  Weight change: -1.905 kg (-4 lb 3.2 oz) Filed Weights   11/27/17 1447 11/28/17 1515 11/29/17 0700  Weight: 74.8 kg (165 lb) 72.9 kg (160 lb 12.8 oz) 73.1 kg (161 lb 2.5 oz)    Intake/Output:    Intake/Output Summary (Last 24 hours) at 11/29/2017 3557 Last data filed at 11/29/2017 0210 Gross per 24 hour  Intake 351 ml  Output 0 ml  Net 351 ml     Physical Exam: General:  No acute distress, laying in the bed  HEENT  anicteric, moist oral mucous membranes  Neck  supple  Pulm/lungs  normal breathing effort, clear to auscultation  CVS/Heart  regular rhythm   Abdomen:   soft, nontender   Extremities:  no peripheral edema  Neurologic:  Alert, oriented  Skin:  Left lateral ankle ulcer  Access:  Left upper arm AVG       Basic Metabolic Panel:  Recent Labs  Lab 11/27/17 1526 11/28/17 0425  NA 138 141  K 3.9 4.5  CL 94* 98  CO2 30 34*  GLUCOSE 95 92  BUN 19 22  CREATININE 6.89* 8.33*  CALCIUM 9.2 9.6     CBC: Recent Labs  Lab 11/27/17 1526 11/28/17 0425  WBC 12.4* 10.3  NEUTROABS 10.2*  --   HGB 10.2* 10.1*  HCT 30.1* 29.9*  MCV 93.3 93.5  PLT 275 248      Lab Results  Component Value Date   HEPBSAG Negative 09/30/2015      Microbiology:  Recent Results (from the past 240 hour(s))  MRSA PCR Screening     Status: None   Collection Time: 11/20/17   3:32 AM  Result Value Ref Range Status   MRSA by PCR NEGATIVE NEGATIVE Final    Comment:        The GeneXpert MRSA Assay (FDA approved for NASAL specimens only), is one component of a comprehensive MRSA colonization surveillance program. It is not intended to diagnose MRSA infection nor to guide or monitor treatment for MRSA infections. Performed at [facility], [address] [place], [place] [address]   MRSA PCR Screening     Status: None   Collection Time: 11/27/17  6:37 PM  Result Value Ref Range Status   MRSA by PCR NEGATIVE NEGATIVE Final    Comment:        The GeneXpert MRSA Assay (FDA approved for NASAL specimens only), is one component of a comprehensive MRSA colonization surveillance program. It is not intended to diagnose MRSA infection nor to guide or monitor treatment for MRSA infections. Performed at [facility], [address] [place], [place] [address]     Coagulation Studies: No results for input(s): LABPROT, INR in the last 72 hours.  Urinalysis: No results for input(s): COLORURINE, LABSPEC, PHURINE, GLUCOSEU, HGBUR, BILIRUBINUR, KETONESUR, PROTEINUR, UROBILINOGEN, NITRITE, LEUKOCYTESUR in  the last 72 hours.  Invalid input(s): APPERANCEUR    Imaging: Dg Foot Complete Left  Result Date: 11/27/2017 CLINICAL DATA:  Left leg pain and swelling, history of left leg revascularization EXAM: LEFT FOOT - COMPLETE 3+ VIEW COMPARISON:  11/19/2017 FINDINGS: Degenerative changes of the first MTP joint are again seen. No acute fracture or dislocation is noted. Diffuse vascular calcifications are seen. No other gross soft tissue abnormality is noted. IMPRESSION: Chronic changes without acute abnormality. Electronically Signed   By: Inez Catalina M.D.   On: 11/27/2017 15:31     Medications:   . sodium chloride    . piperacillin-tazobactam (ZOSYN)  IV Stopped (11/29/17 0210)  . vancomycin     . allopurinol  100 mg Oral Daily  . amLODipine   10 mg Oral Daily  . atorvastatin  10 mg Oral Daily  . calcium acetate  1,334 mg Oral TID WC  . Calcium Gluconate  50 mg Oral Daily  . Chlorhexidine Gluconate Cloth  6 each Topical Q0600  . cinacalcet  90 mg Oral Daily  . clonazePAM  0.5 mg Oral QHS  . clopidogrel  75 mg Oral Daily  . collagenase   Topical Daily  . epoetin (EPOGEN/PROCRIT) injection  4,000 Units Intravenous Q M,W,F-HD  . feeding supplement (NEPRO CARB STEADY)  237 mL Oral BID BM  . furosemide  40 mg Oral See admin instructions  . gabapentin  200 mg Oral BID  . heparin  5,000 Units Subcutaneous Q8H  . hydrALAZINE  25 mg Oral Daily  . irbesartan  300 mg Oral QHS  . metoprolol succinate  50 mg Oral Daily  . multivitamin  1 tablet Oral QHS  . multivitamin-lutein  1 capsule Oral Daily  . pregabalin  50 mg Oral Daily  . sevelamer carbonate  2,400 mg Oral TID WC  . sodium chloride flush  3 mL Intravenous Q12H   sodium chloride, acetaminophen **OR** acetaminophen, colchicine, heparin, ondansetron **OR** ondansetron (ZOFRAN) IV, senna-docusate, sodium chloride flush, traMADol  Assessment/ Plan:  67 y.o. male with pmhx of ESRD, cardiomyopathy, hypertension, anemia of ckd, shpth, e. coli bacteremia 9/12, left hand inflammatory arthritis resolved, angioplasty cephalic vein 3/29, access declot 9/15. colonoscopy 02/2015, admission for chest pain 11/18/17.   [facility]/MWF/LUE AVG/EDW kg  1.  End-stage renal disease Continue HD as per schedule.  2.  Anemia of chronic kidney disease We will continue Procrit with hemodialysis  3.  Secondary hyperparathyroidism We will monitor phosphorus and calcium during hospitalization  4.  Peripheral arterial disease.  Vascular surgery evaluation in progress MRI of left foot has been done, Results are pending Vanc/Zosyn as per IM team.  5. Ongoing weight loss - patient is requesting to go to rehab as he has no help at home and it is difficult for him to manage by  himself      LOS: 2 Eilis Chestnutt Candiss Norse 7/24/20198:07 AM  [facility], [place]  Note: This note was prepared with Dragon dictation. Any transcription errors are unintentional

## 2017-11-29 NOTE — Progress Notes (Signed)
HD Tx complete, stopped due to clotted system, MD aware. Just under UF goal.    11/29/17 1000  Vital Signs  Pulse Rate 66  Pulse Rate Source Monitor  Resp 15  BP (!) 183/83  BP Location Right Arm  BP Method Automatic  Patient Position (if appropriate) Lying  Oxygen Therapy  SpO2 98 %  O2 Device Room Air  During Hemodialysis Assessment  HD Safety Checks Performed Yes  Dialysis Fluid Bolus Normal Saline  Bolus Amount (mL) 250 mL  Intra-Hemodialysis Comments Tolerated well;Tx completed

## 2017-11-29 NOTE — Evaluation (Signed)
Physical Therapy Evaluation Patient Details Name: Johnny [name]. MRN: [id number] DOB: July 02, 1950 Today's Date: 11/29/2017   History of Present Illness   Abrahim Sargent  is a 67 y.o. male with a known history of end-stage renal disease on dialysis, congestive heart failure, gout, hyperlipidemia, hypertension, peripheral arterial disease recently had femoral stents placed in the left lower extremity for arterial occlusion.  Patient has left foot ulcer which is nonhealing and has pain in the left lower extremity according to vascular surgery the pain is typical for post reperfusion  Clinical Impression  Pt wassupine in bed on PT arrival, and reported feeling "a little tired" and having some pain in LLE ( 7/10). Pt had received dialysis in AM, and had recently received medication from nursing. Pt performed AROM exercises in bed and was engaging in conversation with no reported symptoms. Pt home information was collected ( see note below), and it was determined chair transfers would be integral part of patient ability to d/c home safely. Pt performed supine to sit mod assist and very quickly expressed dizziness and lightheadedness in sitting. Pt was returned to supine per pt request and blood pressure was taken and recorded at 98/57. PT lowered head of bed and monitored pt's symptoms. Pt was able to continue conversing and maintained eyes open, but still complained of the room spinning.  Nursing staff was notified of situation. Pt BP was rechecked and recorded at 81/54. Pt was then moved in to Trendelenburg position and again pt symptoms were monitored. Pt continued to be cognitively alert and aware. Nursing staff arrived and began care for hypotensive event. Pt insisted on ambulating to bathroom toilet for BM, and nurse and therapist gave mod assist 2+ w/RW for sit to/from stand and ambulation to/from toilet. Therapy was then discontinued for today. Further evaluation of pt will be necessary for full therapy  assessment.  Would benefit from skilled PT to address deficits that were abserved. Recommend transition to SNF upon discharge from acute hospitalization.  Follow Up Recommendations SNF    Equipment Recommendations  Rolling walker with 5" wheels    Recommendations for Other Services OT consult     Precautions / Restrictions Precautions Precautions: Fall Precaution Comments: Pt reports hx of 4 falls over past 6 months      Mobility  Bed Mobility Overal bed mobility: Needs Assistance Bed Mobility: Supine to Sit     Supine to sit: Mod assist        Transfers Overall transfer level: Needs assistance Equipment used: Rolling walker (2 wheeled) Transfers: Sit to/from Stand Sit to Stand: Mod assist;+2 physical assistance         General transfer comment: Pt was undergoing hypotensive event-- was not able to accurately assess sit to stand ability due to situation  Ambulation/Gait Ambulation/Gait assistance: Mod assist;+2 physical assistance Gait Distance (Feet): 10 Feet Assistive device: Rolling walker (2 wheeled)       General Gait Details: Pt was undergoing hypotensive event and ambulating to bathroom with help from therapist and nursing staff-- was not able to accurately assess due to situation  Stairs            Wheelchair Mobility    Modified Rankin (Stroke Patients Only)       Balance Overall balance assessment: Mild deficits observed, not formally tested  Pertinent Vitals/Pain Pain Assessment: 0-10 Pain Score: 7  Pain Location: LLE Pain Descriptors / Indicators: Constant;Burning Pain Intervention(s): Limited activity within patient's tolerance;Monitored during session;Premedicated before session;Utilized relaxation techniques    Home Living Family/patient expects to be discharged to:: Private residence Living Arrangements: Alone Available Help at Discharge: Other (Comment)(Comments he  has no friends or family to help) Type of Home: Apartment Home Access: Elevator(Apartment on 3rd floor)     Home Layout: One level Home Equipment: Cane - single point;Wheelchair - manual      Prior Function Level of Independence: Independent with assistive device(s)         Comments: Pt reports using wheelchair in community and for transport to and from elevator, and cane for home ambulation. Reports he does all his own cooking, cleaning, and other ADL's     Hand Dominance        Extremity/Trunk Assessment   Upper Extremity Assessment Upper Extremity Assessment: Overall WFL for tasks assessed    Lower Extremity Assessment Lower Extremity Assessment: Overall WFL for tasks assessed    Cervical / Trunk Assessment Cervical / Trunk Assessment: Normal  Communication   Communication: No difficulties  Cognition Arousal/Alertness: Awake/alert Behavior During Therapy: WFL for tasks assessed/performed Overall Cognitive Status: Within Functional Limits for tasks assessed                                        General Comments General comments (skin integrity, edema, etc.): Not able to assess at this time due to pt's inabilty to sit up or stand secondary to hypotensive event    Exercises Other Exercises Other Exercises: AROM: BUE-shoulder flex/ext, elbow flex/ext; BLE: Hip flex/ext, Knee flex/ext, ankle flex/ext Other Exercises: Bed mobility; Supine to sit mod assist, Trasnfers; sit to stand mod asssit 2+; ambulation: 10 feet mod assist 2+   Assessment/Plan    PT Assessment Patient needs continued PT services  PT Problem List Decreased strength;Decreased mobility;Decreased safety awareness;Decreased range of motion;Decreased activity tolerance;Decreased balance;Pain       PT Treatment Interventions DME instruction;Gait training;Functional mobility training;Balance training;Therapeutic exercise;Therapeutic activities;Patient/family education;Wheelchair  mobility training    PT Goals (Current goals can be found in the Care Plan section)       Frequency Min 2X/week   Barriers to discharge        Co-evaluation               AM-PAC PT "6 Clicks" Daily Activity  Outcome Measure Difficulty turning over in bed (including adjusting bedclothes, sheets and blankets)?: Unable Difficulty moving from lying on back to sitting on the side of the bed? : Unable Difficulty sitting down on and standing up from a chair with arms (e.g., wheelchair, bedside commode, etc,.)?: Unable Help needed moving to and from a bed to chair (including a wheelchair)?: A Lot Help needed walking in hospital room?: A Lot Help needed climbing 3-5 steps with a railing? : Total 6 Click Score: 8    End of Session Equipment Utilized During Treatment: Gait belt Activity Tolerance: Treatment limited secondary to medical complications (Comment)(Pt had orhtostatic hypotensive event, and therapy was discontinued) Patient left: in bed;with bed alarm set;with nursing/sitter in room;with call bell/phone within reach Nurse Communication: Other (comment)(Nurse was called after pt blood pressure dropped precipitously when going supine ot sit, and remained low with return to supine) PT Visit Diagnosis: Unsteadiness on feet (R26.81);Repeated falls (R29.6);Difficulty in walking, not  elsewhere classified (R26.2);History of falling (Z91.81);Pain Pain - Right/Left: Left Pain - part of body: Leg    Time: 9485-4627 PT Time Calculation (min) (ACUTE ONLY): 44 min   Charges:         PT G Codes:        Hortencia Conradi, SPT 11/29/17,5:05 PM

## 2017-11-29 NOTE — Progress Notes (Signed)
Pre HD Assessment    11/29/17 0645  Neurological  Level of Consciousness Alert  Orientation Level Oriented X4  Respiratory  Respiratory Pattern Regular;Unlabored  Chest Assessment Chest expansion symmetrical  Bilateral Breath Sounds Diminished  Cough None  Cardiac  Pulse Regular  Heart Sounds S1, S2  Vascular  R Radial Pulse +2  L Radial Pulse +2  Edema Generalized  Generalized Edema +1  Psychosocial  Psychosocial (WDL) WDL

## 2017-11-29 NOTE — Progress Notes (Signed)
Post HD [facility]    11/29/17 1015  Vital Signs  Temp 98.1 F (36.7 C)  Temp Source Oral  Pulse Rate 77  Pulse Rate Source Monitor  Resp 17  BP (!) 170/79  BP Location Right Arm  BP Method Automatic  Patient Position (if appropriate) Lying  Oxygen Therapy  SpO2 100 %  O2 Device Room Air  Pain Assessment  Pain Scale 0-10  Pain Score 0  Dialysis Weight  Weight 72.3 kg (159 lb 6.3 oz)  Type of Weight Post-Dialysis  Post-Hemodialysis Assessment  Rinseback Volume (mL) 250 mL  Dialyzer Clearance Clotted  Duration of HD Treatment -hour(s) 3 hour(s)  Hemodialysis Intake (mL) 500 mL  UF Total -Machine (mL) 1472 mL  Net UF (mL) 972 mL  Tolerated HD Treatment Yes  AVG/AVF Arterial Site Held (minutes) 10 minutes  AVG/AVF Venous Site Held (minutes) 10 minutes  Fistula / Graft Left Upper arm  No Placement Date or Time found.   Orientation: Left  Access Location: Upper arm  Site Condition No complications  Fistula / Graft Assessment Present;Thrill;Bruit  Status Deaccessed

## 2017-11-29 NOTE — Progress Notes (Signed)
PT was working with the patient and he was feeling dizzy while sitting on the side of the bed.  BP was as low as 81/54. (see VS flow sheet).  Just a few minutes after laying the patient down he insisted on going to the bathroom to have a BM.  I informed him that this was not a safe idea.  He still insisted on getting up.  He continued to feel dizzy while going to and from the bathroom.  BP did come up to 134/70.  Patient vomited 697ml after returning to the bed after leaving the bathroom.  Dr Benjie Karvonen informed and will not be discharging the patient today

## 2017-11-29 NOTE — Progress Notes (Signed)
PT Cancellation Note  Patient Details Name: Johnny [name]. MRN: [id number] DOB: 1950-08-21   Cancelled Treatment:    Reason Eval/Treat Not Completed: Patient at procedure or test/unavailable(Consult received and chart reviewed.  Patient currently off unit for dialysis. Will re-attempt at later time/date as medically appropriate and available.)   Dorothea Yow H. Owens Shark, PT, DPT, NCS 11/29/17, 8:52 AM [phone]

## 2017-11-29 NOTE — Progress Notes (Signed)
Patient arrived recently from dialysis

## 2017-11-29 NOTE — Progress Notes (Signed)
Pre HD Tx    11/29/17 0700  Vital Signs  Temp 97.8 F (36.6 C)  Temp Source Oral  Pulse Rate 66  Pulse Rate Source Monitor  Resp (!) 7  BP 138/76  BP Location Right Arm  BP Method Automatic  Patient Position (if appropriate) Lying  Oxygen Therapy  SpO2 99 %  O2 Device Room Air  Pain Assessment  Pain Scale 0-10  Pain Score 0  Dialysis Weight  Weight 73.1 kg (161 lb 2.5 oz)  Type of Weight Pre-Dialysis  Time-Out for Hemodialysis  What Procedure? HD   Pt Identifiers(min of two) First/Last Name;MRN/Account#  Correct Site? Yes  Correct Side? Yes  Correct Procedure? Yes  Consents Verified? Yes  Rad Studies Available? N/A  Safety Precautions Reviewed? Yes  [employer] Number [id number]  Station Number 1  UF/Alarm Test Passed  Conductivity: Meter 14  Conductivity: Machine  14.2  pH 7.4  Reverse Osmosis Main  Normal Saline Lot Number [id number]  Dialyzer Lot Number [id number]  Disposable Set Lot Number [id number]  Machine Temperature 98.6 F (37 C)  Musician and Audible Yes  Blood Lines Intact and Secured Yes  Pre Treatment Patient Checks  Vascular access used during treatment Fistula  Hepatitis B Surface Antigen Results Negative  Date Hepatitis B Surface Antigen Drawn 04/26/17  Hepatitis B Surface Antibody 37  Date Hepatitis B Surface Antibody Drawn 04/26/17  Hemodialysis Consent Verified Yes  Hemodialysis Standing Orders Initiated Yes  ECG (Telemetry) Monitor On Yes  Prime Ordered Normal Saline  Length of  DialysisTreatment -hour(s) 3.5 Hour(s)  Dialysis Treatment Comments Na 140  Dialyzer Elisio 17H NR  Dialysate 3K, 2.5 Ca  Dialysis Anticoagulant None  Dialysate Flow Ordered 800  Blood Flow Rate Ordered 400 mL/min  Ultrafiltration Goal 1 Liters  Pre Treatment Labs Renal panel;CBC  Dialysis Blood Pressure Support Ordered Normal Saline  Education / Care Plan  Dialysis Education Provided Yes  Documented Education in Care Plan Yes  Fistula / Graft  Left Upper arm  No Placement Date or Time found.   Orientation: Left  Access Location: Upper arm  Site Condition No complications  Fistula / Graft Assessment Present;Thrill;Bruit  Status Patent

## 2017-11-30 ENCOUNTER — Observation Stay: Payer: Medicare Other

## 2017-11-30 MED ORDER — PROMETHAZINE HCL 25 MG/ML IJ SOLN
12.5000 mg | Freq: Three times a day (TID) | INTRAMUSCULAR | Status: DC | PRN
  Administered 2017-11-30: 12.5 mg via INTRAVENOUS
  Filled 2017-11-30: qty 1

## 2017-11-30 MED ORDER — TRAMADOL HCL 50 MG PO TABS: ORAL_TABLET | ORAL | 0 refills | Status: DC

## 2017-11-30 NOTE — Care Management Obs Status (Addendum)
[place] NOTIFICATION   Patient Details  Name: Johnny [name]. MRN: [id number] Date of Birth: [birth year]   Medicare Observation Status Notification Given:  Yes  Form reviewed with patient.  Patient was nauseated at this time.  He requested that RNCM sign form on his behalf.  Copy provided to patient     Beverly Sessions, RN 11/30/2017, 12:21 PM

## 2017-11-30 NOTE — Progress Notes (Signed)
Patient vomited a large amount.  Dr Benjie Karvonen notified and said to order a KUB.  If the KUB is negative then discharge the patient to [place]

## 2017-11-30 NOTE — NC FL2 (Signed)
[place] LEVEL OF CARE SCREENING TOOL     IDENTIFICATION  Patient Name: Johnny [name]. Birthdate: 01/28/1951 Sex: male Admission Date (Current Location): 11/27/2017  [place] and [place] Number:  Engineering geologist and Address:  [facility], [address] [place], [place] [address]      Provider Number: [id number]  Attending Physician Name and Address:  Bettey Costa, MD  Relative Name and Phone Number:       Current Level of Care: Hospital Recommended Level of Care: [place] Prior Approval Number:    Date Approved/Denied:   PASRR Number:    Discharge Plan: SNF    Current Diagnoses: Patient Active Problem List   Diagnosis Date Noted  . Cellulitis 11/27/2017  . Chest pain 11/19/2017  . Atherosclerosis of native arteries of extremity with intermittent claudication ([place]) 11/07/2017  . Elevated troponin 10/02/2015  . Complications, dialysis, catheter, mechanical ([place]) 10/02/2015  . Musculoskeletal chest pain 09/28/2015  . ESRD on dialysis ([place]) 09/28/2015  . HTN (hypertension) 09/28/2015  . Chronic diastolic CHF (congestive heart failure) ([place]) 09/28/2015  . Gout 09/28/2015    Orientation RESPIRATION BLADDER Height & Weight     Self, Time, Situation, Place  Normal Continent Weight: 152 lb 8.9 oz (69.2 kg) Height:  5\' 7"  (170.2 cm)  BEHAVIORAL SYMPTOMS/MOOD NEUROLOGICAL BOWEL NUTRITION STATUS  (none) (none) Continent Diet  AMBULATORY STATUS COMMUNICATION OF NEEDS Skin   Limited Assist Verbally Surgical wounds                       Personal Care Assistance Level of Assistance  Bathing, Dressing Bathing Assistance: Independent   Dressing Assistance: Independent     Functional Limitations Info  (no issues)          SPECIAL CARE FACTORS FREQUENCY  PT (By licensed PT)                    Contractures Contractures Info: Not present    Additional Factors Info  Allergies   Allergies  Info: shellfish           Current Medications (11/30/2017):  This is the current hospital active medication list Current Facility-Administered Medications  Medication Dose Route Frequency Provider Last Rate Last Dose  . 0.9 %  sodium chloride infusion  250 mL Intravenous PRN Pyreddy, Reatha Harps, MD      . acetaminophen (TYLENOL) tablet 650 mg  650 mg Oral Q6H PRN Pyreddy, Reatha Harps, MD       Or  . acetaminophen (TYLENOL) suppository 650 mg  650 mg Rectal Q6H PRN Pyreddy, Reatha Harps, MD      . allopurinol (ZYLOPRIM) tablet 100 mg  100 mg Oral Daily Pyreddy, Reatha Harps, MD   100 mg at 11/29/17 1054  . amLODipine (NORVASC) tablet 10 mg  10 mg Oral Daily Pyreddy, Reatha Harps, MD   10 mg at 11/29/17 1055  . atorvastatin (LIPITOR) tablet 10 mg  10 mg Oral Daily Pyreddy, Reatha Harps, MD   10 mg at 11/29/17 1055  . calcium acetate (PHOSLO) capsule 1,334 mg  1,334 mg Oral TID WC Saundra Shelling, MD   1,334 mg at 11/30/17 0728  . Chlorhexidine Gluconate Cloth 2 % PADS 6 each  6 each Topical Q0600 Murlean Iba, MD   6 each at 11/30/17 0654  . cinacalcet (SENSIPAR) tablet 90 mg  90 mg Oral Daily Pyreddy, Reatha Harps, MD   90 mg at 11/29/17 1057  . clonazePAM (KLONOPIN) tablet 0.5 mg  0.5 mg Oral QHS Saundra Shelling, MD   0.5 mg at 11/29/17 2231  . clopidogrel (PLAVIX) tablet 75 mg  75 mg Oral Daily Pyreddy, Reatha Harps, MD   75 mg at 11/29/17 1054  . colchicine tablet 0.6 mg  0.6 mg Oral Daily PRN Saundra Shelling, MD   0.6 mg at 11/29/17 0528  . collagenase (SANTYL) ointment   Topical Daily Samara Deist, DPM      . epoetin alfa (EPOGEN,PROCRIT) injection 4,000 Units  4,000 Units Intravenous Q M,W,F-HD Murlean Iba, MD   4,000 Units at 11/29/17 0910  . feeding supplement (NEPRO CARB STEADY) liquid 237 mL  237 mL Oral BID BM Mody, Sital, MD   237 mL at 11/29/17 1058  . furosemide (LASIX) tablet 40 mg  40 mg Oral See admin instructions Pyreddy, Reatha Harps, MD      . gabapentin (NEURONTIN) capsule 200 mg  200 mg Oral BID Pyreddy, Reatha Harps, MD   200 mg at  11/29/17 2231  . heparin injection 5,000 Units  5,000 Units Subcutaneous Q8H Pyreddy, Reatha Harps, MD   5,000 Units at 11/30/17 9518  . hydrALAZINE (APRESOLINE) tablet 25 mg  25 mg Oral Daily Pyreddy, Reatha Harps, MD   25 mg at 11/29/17 1055  . irbesartan (AVAPRO) tablet 300 mg  300 mg Oral QHS Murlean Iba, MD   300 mg at 11/29/17 2232  . metoprolol succinate (TOPROL-XL) 24 hr tablet 50 mg  50 mg Oral Daily Pyreddy, Pavan, MD   50 mg at 11/29/17 1055  . multivitamin (RENA-VIT) tablet 1 tablet  1 tablet Oral QHS Bettey Costa, MD   1 tablet at 11/29/17 2231  . multivitamin-lutein (OCUVITE-LUTEIN) capsule 1 capsule  1 capsule Oral Daily Bettey Costa, MD   1 capsule at 11/28/17 1743  . ondansetron (ZOFRAN) tablet 4 mg  4 mg Oral Q6H PRN Saundra Shelling, MD       Or  . ondansetron (ZOFRAN) injection 4 mg  4 mg Intravenous Q6H PRN Saundra Shelling, MD   4 mg at 11/29/17 1754  . piperacillin-tazobactam (ZOSYN) IVPB 3.375 g  3.375 g Intravenous Q12H Rocky Morel, RPH   Stopped at 11/30/17 0230  . pregabalin (LYRICA) capsule 50 mg  50 mg Oral Daily Pyreddy, Pavan, MD   50 mg at 11/29/17 1055  . senna-docusate (Senokot-S) tablet 1 tablet  1 tablet Oral QHS PRN Pyreddy, Reatha Harps, MD      . sevelamer carbonate (RENVELA) tablet 2,400 mg  2,400 mg Oral TID WC Pyreddy, Reatha Harps, MD   2,400 mg at 11/30/17 0728  . sodium chloride flush (NS) 0.9 % injection 3 mL  3 mL Intravenous Q12H Pyreddy, Reatha Harps, MD   3 mL at 11/29/17 2231  . sodium chloride flush (NS) 0.9 % injection 3 mL  3 mL Intravenous PRN Pyreddy, Reatha Harps, MD      . traMADol (ULTRAM) tablet 50 mg  50 mg Oral Q6H PRN Saundra Shelling, MD   50 mg at 11/30/17 0728  . vancomycin (VANCOCIN) IVPB 750 mg/150 ml premix  750 mg Intravenous Q M,W,F-HD Rocky Morel, [facility]   Stopped at 11/29/17 1011     Discharge Medications: Please see discharge summary for a list of discharge medications.  Relevant Imaging Results:  Relevant Lab Results:   Additional Information ss:  [id number]  Shela Leff, LCSW

## 2017-11-30 NOTE — Care Management CC44 (Signed)
Condition Code 44 Documentation Completed  Patient Details  Name: Johnny [name]. MRN: [id number] Date of Birth: Jul 08, 1950   Condition Code 44 given:  Yes Patient signature on Condition Code 44 notice:  Yes Documentation of 2 MD's agreement:  Yes Code 44 added to claim:  Yes    Beverly Sessions, RN 11/30/2017, 12:21 PM

## 2017-11-30 NOTE — Clinical Social Work Note (Signed)
Clinical Social Work Assessment  Patient Details  Name: Johnny [name]. MRN: [id number] Date of Birth: 07/06/1950  Date of referral:  11/30/17               Reason for consult:  Discharge Planning                Permission sought to share information with:  Facility Art therapist granted to share information::  Yes, Verbal Permission Granted  Name::        Agency::     Relationship::     Contact Information:     Housing/Transportation Living arrangements for the past 2 months:  Single Family Home Source of Information:  Patient Patient Interpreter Needed:  None Criminal Activity/Legal Involvement Pertinent to Current Situation/Hospitalization:  No - Comment as needed Significant Relationships:  None Lives with:    Do you feel safe going back to the place where you live?  Yes Need for family participation in patient care:  No (Coment)  Care giving concerns:  Patient resides at home alone.   Social Worker assessment / plan:  Patient since admission has been requesting to go to rehab for various reasons. Mainly because he says he can't cook for himself or get groceries. PT assessed patient and they are recommending short term rehab.   CSW spoke with patient this morning and he is in agreement with placing him for short term rehab. He has chosen [facility] from the bed offers given to him. Tammy at [facility] is aware and will begin authorization with insurance.  Employment status:  Retired Nurse, adult PT Recommendations:  [place] / Referral to community resources:     Patient/Family's Response to care:  Patient expressed appreciation for CSW assistance.  Patient/Family's Understanding of and Emotional Response to Diagnosis, Current Treatment, and Prognosis:  Patient is stating he is weak and needs assistance.  Emotional Assessment Appearance:  Appears stated age Attitude/Demeanor/Rapport:  (pleasant and  cooperative) Affect (typically observed):  Adaptable, Calm Orientation:  Oriented to Self, Oriented to Place, Oriented to  Time, Oriented to Situation Alcohol / Substance use:  Not Applicable Psych involvement (Current and /or in the community):  No (Comment)  Discharge Needs  Concerns to be addressed:  Care Coordination Readmission within the last 30 days:  No Current discharge risk:  None Barriers to Discharge:  No Barriers Identified   Shela Leff, LCSW 11/30/2017, 10:34 AM

## 2017-11-30 NOTE — Progress Notes (Signed)
Dr Benjie Karvonen called to inform me that the KUB was negative.  I let her know that the patient was currently vomiting again.  Patient will not be discharged to [facility].  [facility] notified that the patient would not be coming tonight

## 2017-11-30 NOTE — Progress Notes (Signed)
[facility], [place] 11/30/17  Subjective:   States weakness in legs Ate less than 50% of breakfast    Objective:  Vital signs in last 24 hours:  Temp:  [97.7 F (36.5 C)-98.4 F (36.9 C)] 98.4 F (36.9 C) (07/25 0324) Pulse Rate:  [66-90] 70 (07/25 0324) Resp:  [13-17] 16 (07/25 0324) BP: (88-193)/(56-85) 111/57 (07/25 0324) SpO2:  [98 %-100 %] 99 % (07/25 0324) Weight:  [69.2 kg (152 lb 8.9 oz)-72.3 kg (159 lb 6.3 oz)] 69.2 kg (152 lb 8.9 oz) (07/25 0654)  Weight change: -0.638 kg (-1 lb 6.5 oz) Filed Weights   11/29/17 0700 11/29/17 1015 11/30/17 0654  Weight: 73.1 kg (161 lb 2.5 oz) 72.3 kg (159 lb 6.3 oz) 69.2 kg (152 lb 8.9 oz)    Intake/Output:    Intake/Output Summary (Last 24 hours) at 11/30/2017 0936 Last data filed at 11/30/2017 0324 Gross per 24 hour  Intake 527.29 ml  Output 1573 ml  Net -1045.71 ml     Physical Exam: General:  No acute distress, laying in the bed  HEENT  anicteric, moist oral mucous membranes  Neck  supple  Pulm/lungs  normal breathing effort, clear to auscultation  CVS/Heart  regular rhythm   Abdomen:   soft, nontender   Extremities:  no peripheral edema  Neurologic:  Alert, oriented  Skin:  Left lateral ankle ulcer  Access:  Left upper arm AVG       Basic Metabolic Panel:  Recent Labs  Lab 11/27/17 1526 11/28/17 0425 11/29/17 0802  NA 138 141 140  K 3.9 4.5 4.4  CL 94* 98 97*  CO2 30 34* 32  GLUCOSE 95 92 83  BUN 19 22 34*  CREATININE 6.89* 8.33* 10.65*  CALCIUM 9.2 9.6 9.2  PHOS  --   --  6.3*     CBC: Recent Labs  Lab 11/27/17 1526 11/28/17 0425 11/29/17 0802  WBC 12.4* 10.3 10.6  NEUTROABS 10.2*  --   --   HGB 10.2* 10.1* 9.5*  HCT 30.1* 29.9* 27.4*  MCV 93.3 93.5 93.6  PLT 275 248 230      Lab Results  Component Value Date   HEPBSAG Negative 09/30/2015      Microbiology:  Recent Results (from the past 240 hour(s))  MRSA PCR Screening     Status: None    Collection Time: 11/27/17  6:37 PM  Result Value Ref Range Status   MRSA by PCR NEGATIVE NEGATIVE Final    Comment:        The GeneXpert MRSA Assay (FDA approved for NASAL specimens only), is one component of a comprehensive MRSA colonization surveillance program. It is not intended to diagnose MRSA infection nor to guide or monitor treatment for MRSA infections. Performed at [facility], [address] [place], [place] [address]     Coagulation Studies: No results for input(s): LABPROT, INR in the last 72 hours.  Urinalysis: No results for input(s): COLORURINE, LABSPEC, PHURINE, GLUCOSEU, HGBUR, BILIRUBINUR, KETONESUR, PROTEINUR, UROBILINOGEN, NITRITE, LEUKOCYTESUR in the last 72 hours.  Invalid input(s): APPERANCEUR    Imaging: Mr Ankle Left Wo Contrast  Result Date: 11/29/2017 CLINICAL DATA:  Nonhealing left heel ulcer. Evaluate for osteomyelitis. Recent left femoral stent placement for arterial occlusion. EXAM: MRI OF THE LEFT ANKLE WITHOUT CONTRAST TECHNIQUE: Multiplanar, multisequence MR imaging of the ankle was performed. No intravenous contrast was administered. COMPARISON:  Left foot x-rays from yesterday. FINDINGS: TENDONS Peroneal: Peroneal longus tendon intact. Peroneal brevis  intact. Posteromedial: Posterior tibial tendon intact. Flexor hallucis longus tendon intact. Flexor digitorum longus tendon intact. Anterior: Tibialis anterior tendon intact. Extensor hallucis longus tendon intact Extensor digitorum longus tendon intact. Achilles:  Intact. Mild thickening of the Achilles tendon. Plantar Fascia: Intact. LIGAMENTS Lateral: Anterior talofibular ligament intact. Calcaneofibular ligament intact. Posterior talofibular ligament intact. Anterior and posterior tibiofibular ligaments intact. Medial: Deltoid ligament intact. Spring ligament intact. Lisfranc ligament intact. CARTILAGE Ankle Joint: No joint effusion. Normal ankle mortise. No chondral defect. Subtalar  Joints/Sinus Tarsi: Normal subtalar joints. No subtalar joint effusion. Normal sinus tarsi. Bones: No marrow signal abnormality.  No fracture or dislocation. Soft Tissue: Increased T2 signal within the intrinsic muscles of the foot, nonspecific, but possibly related to reperfusion injury. No fluid collection or hematoma. No soft tissue mass. Mild soft tissue swelling over the medial and lateral malleoli. IMPRESSION: 1.  No acute osseous abnormality.  No evidence of osteomyelitis. 2. Mild Achilles tendinosis. 3. Increased edema within the intrinsic muscles of the foot is nonspecific but may be related to reperfusion injury given clinical history. Electronically Signed   By: Titus Dubin M.D.   On: 11/29/2017 10:14     Medications:   . sodium chloride    . piperacillin-tazobactam (ZOSYN)  IV Stopped (11/30/17 0230)  . vancomycin Stopped (11/29/17 1011)   . allopurinol  100 mg Oral Daily  . amLODipine  10 mg Oral Daily  . atorvastatin  10 mg Oral Daily  . calcium acetate  1,334 mg Oral TID WC  . Chlorhexidine Gluconate Cloth  6 each Topical Q0600  . cinacalcet  90 mg Oral Daily  . clonazePAM  0.5 mg Oral QHS  . clopidogrel  75 mg Oral Daily  . collagenase   Topical Daily  . epoetin (EPOGEN/PROCRIT) injection  4,000 Units Intravenous Q M,W,F-HD  . feeding supplement (NEPRO CARB STEADY)  237 mL Oral BID BM  . furosemide  40 mg Oral See admin instructions  . gabapentin  200 mg Oral BID  . heparin  5,000 Units Subcutaneous Q8H  . hydrALAZINE  25 mg Oral Daily  . irbesartan  300 mg Oral QHS  . metoprolol succinate  50 mg Oral Daily  . multivitamin  1 tablet Oral QHS  . multivitamin-lutein  1 capsule Oral Daily  . pregabalin  50 mg Oral Daily  . sevelamer carbonate  2,400 mg Oral TID WC  . sodium chloride flush  3 mL Intravenous Q12H   sodium chloride, acetaminophen **OR** acetaminophen, colchicine, ondansetron **OR** ondansetron (ZOFRAN) IV, senna-docusate, sodium chloride flush,  traMADol  Assessment/ Plan:  67 y.o. male with pmhx of ESRD, cardiomyopathy, hypertension, anemia of ckd, shpth, e. coli bacteremia 9/12, left hand inflammatory arthritis resolved, angioplasty cephalic vein 3/61, access declot 9/15. colonoscopy 02/2015, admission for chest pain 11/18/17.   [facility]/MWF/LUE AVG/EDW kg  1.  End-stage renal disease Continue HD as per schedule. [facility] tomorrow  2.  Anemia of chronic kidney disease We will continue Procrit with hemodialysis  3.  Secondary hyperparathyroidism We will monitor phosphorus and calcium during hospitalization  4.  Peripheral arterial disease.  Vascular surgery evaluation in progress MRI of left foot shows edema in intrinsic muscles of foot Currently on Abx  5. Ongoing weight loss - patient is requesting to go to rehab as he has no help at home and it is difficult for him to manage by himself    LOS: [place] 7/25/20199:36 AM  [place], [place]  Note: This note was prepared with Dragon dictation. Any transcription errors are unintentional

## 2017-11-30 NOTE — Clinical Social Work Note (Signed)
[facility] has received auth from insurance. MD notified. Tammy at facility is aware and discharge information sent.Patient aware. Shela Leff MSW,LCSW [phone]

## 2017-11-30 NOTE — Progress Notes (Signed)
Patient was nauseated at shift change during bedside report.  It is not time for zofran again.  Dr Marcille Blanco notified and will be ordering something for nausea

## 2017-12-01 LAB — CBC
HCT: 27.6 % — ABNORMAL LOW (ref 40.0–52.0)
HEMOGLOBIN: 9.1 g/dL — AB (ref 13.0–18.0)
MCH: 31.3 pg (ref 26.0–34.0)
MCHC: 33 g/dL (ref 32.0–36.0)
MCV: 94.7 fL (ref 80.0–100.0)
PLATELETS: 285 10*3/uL (ref 150–440)
RBC: 2.91 MIL/uL — AB (ref 4.40–5.90)
RDW: 15.1 % — ABNORMAL HIGH (ref 11.5–14.5)
WBC: 12.5 10*3/uL — AB (ref 3.8–10.6)

## 2017-12-01 LAB — GLUCOSE, CAPILLARY: Glucose-Capillary: 115 mg/dL — ABNORMAL HIGH (ref 70–99)

## 2017-12-01 MED ORDER — ONDANSETRON HCL 4 MG PO TABS: 4.0000 mg | ORAL_TABLET | Freq: Four times a day (QID) | ORAL | 0 refills | Status: DC | PRN

## 2017-12-01 NOTE — Care Management (Signed)
Patient to discharge to SNF today.  CSW facilitating.  Johnny [name] dialysis liaison notified of discharge.  RNCM signing off

## 2017-12-01 NOTE — Progress Notes (Signed)
[place] [facility] [place] NAME: Johnny [name]    MR#:  [id number]  DATE OF BIRTH:  August 15, 1950  SUBJECTIVE:  No issues  REVIEW OF SYSTEMS:    Review of Systems  Constitutional: Negative for fever, chills weight loss HENT: Negative for ear pain, nosebleeds, congestion, facial swelling, rhinorrhea, neck pain, neck stiffness and ear discharge.   Respiratory: Negative for cough, shortness of breath, wheezing  Cardiovascular: Negative for chest pain, palpitations and leg swelling.  Gastrointestinal: Negative for heartburn, abdominal pain, vomiting, diarrhea or consitpation Genitourinary: Negative for dysuria, urgency, frequency, hematuria Musculoskeletal: Negative for back pain or joint pain Neurological: Negative for dizziness, seizures, syncope, focal weakness,  numbness and headaches.  Hematological: Does not bruise/bleed easily.  Psychiatric/Behavioral: Negative for hallucinations, confusion, dysphoric mood    Tolerating Diet: yes      DRUG ALLERGIES:   Allergies  Allergen Reactions  . Shellfish Allergy Anaphylaxis    VITALS:  Blood pressure (!) 169/81, pulse 68, temperature 97.9 F (36.6 C), temperature source Oral, resp. rate 18, height 5\' 7"  (1.702 m), weight 154 lb 11.2 oz (70.2 kg), SpO2 100 %.  PHYSICAL EXAMINATION:  Constitutional: Appears well-developed and well-nourished. No distress. HENT: Normocephalic. [name] Oropharynx is clear and moist.  Eyes: Conjunctivae and EOM are normal. PERRLA, no scleral icterus.  Neck: Normal ROM. Neck supple. No JVD. No tracheal deviation. CVS: RRR, S1/S2 +, no murmurs, no gallops, no carotid bruit.  Pulmonary: Effort and breath sounds normal, no stridor, rhonchi, wheezes, rales.  Abdominal: Soft. BS +,  no distension, tenderness, rebound or guarding.  Musculoskeletal: Normal range of motion. No edema and no tenderness.  Neuro: Alert. CN 2-12 grossly intact. No focal deficits. Skin: small left  lateral heel ulcer noted Psychiatric: Normal mood and affect.      LABORATORY PANEL:   CBC Recent Labs  Lab 11/29/17 0802  WBC 10.6  HGB 9.5*  HCT 27.4*  PLT 230   ------------------------------------------------------------------------------------------------------------------  Chemistries  Recent Labs  Lab 11/27/17 1526  11/29/17 0802  NA 138   < > 140  K 3.9   < > 4.4  CL 94*   < > 97*  CO2 30   < > 32  GLUCOSE 95   < > 83  BUN 19   < > 34*  CREATININE 6.89*   < > 10.65*  CALCIUM 9.2   < > 9.2  AST 21  --   --   ALT 5  --   --   ALKPHOS 51  --   --   BILITOT 0.7  --   --    < > = values in this interval not displayed.   ------------------------------------------------------------------------------------------------------------------  Cardiac Enzymes No results for input(s): TROPONINI in the last 168 hours. ------------------------------------------------------------------------------------------------------------------  RADIOLOGY:  Dg Abd 1 View  Result Date: 11/30/2017 CLINICAL DATA:  Nausea, vomiting since yesterday EXAM: ABDOMEN - 1 VIEW COMPARISON:  None. FINDINGS: There is a non obstructive bowel gas pattern. No supine evidence of free air. No organomegaly or suspicious calcification. No acute bony abnormality. Vascular calcifications. No visible aneurysm. IMPRESSION: No acute findings.  Extensive vascular calcifications. Electronically Signed   By: Rolm Baptise M.D.   On: 11/30/2017 18:28     ASSESSMENT AND PLAN:   67 year old male with history of end-stage renal disease on hemodialysis, chronic diastolic heart failure who recently had femoral stent placed in the left lower extremity for PAD who presents with left foot pain.  1. Left foot nonhealing ulcer: MRI shows no evidence of osteomyelitis. Patient evaluated by podiatry and vascular surgery. Patient will be discharged on oral AUGMENTIN. He will have outpatient follow-up with vascular surgery  and podiatry. ContinueSantyl ointment to superficially enzymatically debride the central aspect    2. End-stage renal disease on hemodialysis: He will continue on outpatient regimen of dialysis  3. PAD: Continue statin and Plavix  4. Essential hypertension: Continue Norvasc, hydralazine, Avapro 5. Chronic diastolic heart failure with preserved ejection fraction without signs of exacerbation   Management plans discussed with the patient and he is in agreement.  CODE STATUS: Full  TOTAL TIME TAKING CARE OF THIS PATIENT: 24 minutes.     POSSIBLE D/C today, DEPENDING ON CLINICAL CONDITION.   Yarieliz Wasser M.D on 12/01/2017 at 8:09 AM  Between 7am to 6pm - Pager - [phone] After 6pm go to [email or web address] - password [email or web address] [facility]  Office  [phone]  CC: Primary care physician; [name], MD  Note: This dictation was prepared with Dragon dictation along with smaller phrase technology. Any transcriptional errors that result from this process are unintentional.

## 2017-12-01 NOTE — Progress Notes (Signed)
This note also relates to the following rows which could not be included: Pulse Rate - Cannot attach notes to unvalidated device data Resp - Cannot attach notes to unvalidated device data BP - Cannot attach notes to unvalidated device data  Hd started  

## 2017-12-01 NOTE — Progress Notes (Signed)
Patient  unable to stand on scale post treatment

## 2017-12-01 NOTE — Progress Notes (Signed)
Post dialysis assessment 

## 2017-12-01 NOTE — Progress Notes (Signed)
Discontinued dialysis 20 mins early due to low bp.

## 2017-12-01 NOTE — Progress Notes (Signed)
[facility], [place] 12/01/17  Subjective:   Discharge cancelled yesterday because patient had vomiting yesterday States no vomiting today Ate less than half of his breakfast Still weak in legs  Objective:  Vital signs in last 24 hours:  Temp:  [97.7 F (36.5 C)-97.9 F (36.6 C)] 97.9 F (36.6 C) (07/26 0546) Pulse Rate:  [68-87] 81 (07/26 0907) Resp:  [16-18] 18 (07/26 0546) BP: (135-169)/(62-81) 169/81 (07/26 0907) SpO2:  [91 %-100 %] 100 % (07/26 0546) Weight:  [70.2 kg (154 lb 11.2 oz)] 70.2 kg (154 lb 11.2 oz) (07/26 0546)  Weight change: -2.129 kg (-4 lb 11.1 oz) Filed Weights   11/29/17 1015 11/30/17 0654 12/01/17 0546  Weight: 72.3 kg (159 lb 6.3 oz) 69.2 kg (152 lb 8.9 oz) 70.2 kg (154 lb 11.2 oz)    Intake/Output:    Intake/Output Summary (Last 24 hours) at 12/01/2017 0954 Last data filed at 12/01/2017 0546 Gross per 24 hour  Intake 413 ml  Output 650 ml  Net -237 ml     Physical Exam: General:  No acute distress, laying in the bed  HEENT  anicteric, moist oral mucous membranes  Neck  supple  Pulm/lungs  normal breathing effort, clear to auscultation  CVS/Heart  regular rhythm   Abdomen:   soft, nontender   Extremities:  no peripheral edema  Neurologic:  Alert, oriented  Skin:  Left lateral ankle ulcer  Access:  Left upper arm AVG       Basic Metabolic Panel:  Recent Labs  Lab 11/27/17 1526 11/28/17 0425 11/29/17 0802  NA 138 141 140  K 3.9 4.5 4.4  CL 94* 98 97*  CO2 30 34* 32  GLUCOSE 95 92 83  BUN 19 22 34*  CREATININE 6.89* 8.33* 10.65*  CALCIUM 9.2 9.6 9.2  PHOS  --   --  6.3*     CBC: Recent Labs  Lab 11/27/17 1526 11/28/17 0425 11/29/17 0802  WBC 12.4* 10.3 10.6  NEUTROABS 10.2*  --   --   HGB 10.2* 10.1* 9.5*  HCT 30.1* 29.9* 27.4*  MCV 93.3 93.5 93.6  PLT 275 248 230      Lab Results  Component Value Date   HEPBSAG Negative 09/30/2015      Microbiology:  Recent Results (from  the past 240 hour(s))  MRSA PCR Screening     Status: None   Collection Time: 11/27/17  6:37 PM  Result Value Ref Range Status   MRSA by PCR NEGATIVE NEGATIVE Final    Comment:        The GeneXpert MRSA Assay (FDA approved for NASAL specimens only), is one component of a comprehensive MRSA colonization surveillance program. It is not intended to diagnose MRSA infection nor to guide or monitor treatment for MRSA infections. Performed at [facility], [address] [place], [place] [address]     Coagulation Studies: No results for input(s): LABPROT, INR in the last 72 hours.  Urinalysis: No results for input(s): COLORURINE, LABSPEC, PHURINE, GLUCOSEU, HGBUR, BILIRUBINUR, KETONESUR, PROTEINUR, UROBILINOGEN, NITRITE, LEUKOCYTESUR in the last 72 hours.  Invalid input(s): APPERANCEUR    Imaging: Dg Abd 1 View  Result Date: 11/30/2017 CLINICAL DATA:  Nausea, vomiting since yesterday EXAM: ABDOMEN - 1 VIEW COMPARISON:  None. FINDINGS: There is a non obstructive bowel gas pattern. No supine evidence of free air. No organomegaly or suspicious calcification. No acute bony abnormality. Vascular calcifications. No visible aneurysm. IMPRESSION: No acute findings.  Extensive vascular calcifications.  Electronically Signed   By: Rolm Baptise M.D.   On: 11/30/2017 18:28     Medications:   . sodium chloride    . piperacillin-tazobactam (ZOSYN)  IV 3.375 g (12/01/17 0909)  . vancomycin Stopped (11/29/17 1011)   . allopurinol  100 mg Oral Daily  . amLODipine  10 mg Oral Daily  . atorvastatin  10 mg Oral Daily  . Chlorhexidine Gluconate Cloth  6 each Topical Q0600  . clonazePAM  0.5 mg Oral QHS  . clopidogrel  75 mg Oral Daily  . collagenase   Topical Daily  . epoetin (EPOGEN/PROCRIT) injection  4,000 Units Intravenous Q M,W,F-HD  . feeding supplement (NEPRO CARB STEADY)  237 mL Oral BID BM  . furosemide  40 mg Oral See admin instructions  . gabapentin  200 mg Oral BID  .  heparin  5,000 Units Subcutaneous Q8H  . hydrALAZINE  25 mg Oral Daily  . irbesartan  300 mg Oral QHS  . metoprolol succinate  50 mg Oral Daily  . multivitamin  1 tablet Oral QHS  . multivitamin-lutein  1 capsule Oral Daily  . pregabalin  50 mg Oral Daily  . sodium chloride flush  3 mL Intravenous Q12H   sodium chloride, acetaminophen **OR** acetaminophen, colchicine, ondansetron **OR** ondansetron (ZOFRAN) IV, promethazine, senna-docusate, sodium chloride flush, traMADol  Assessment/ Plan:  67 y.o. male with pmhx of ESRD, cardiomyopathy, hypertension, anemia of ckd, shpth, e. coli bacteremia 9/12, left hand inflammatory arthritis resolved, angioplasty cephalic vein 4/09, access declot 9/15. colonoscopy 02/2015, admission for chest pain 11/18/17.   [facility]/ [facility]/MWF/LUE AVG/EDW kg  1.  Nausea and vomiting - Hold binders   2.  Anemia of chronic kidney disease We will continue Procrit with hemodialysis  3.  Secondary hyperparathyroidism We will monitor phosphorus and calcium during hospitalization Hold binders for now due to nausea / vomting  4.  Peripheral arterial disease.  Vascular surgery evaluation in progress MRI of left foot shows edema in intrinsic muscles of foot Currently on Abx  5. Ongoing weight loss - d/c planned to [facility]  6. End-stage renal disease Continue HD as per schedule.  HD today    LOS: [place] 7/26/20199:54 AM  [place], [place]  Note: This note was prepared with Dragon dictation. Any transcription errors are unintentional

## 2017-12-01 NOTE — Progress Notes (Signed)
This note also relates to the following rows which could not be included: Pulse Rate - Cannot attach notes to unvalidated device data Resp - Cannot attach notes to unvalidated device data BP - Cannot attach notes to unvalidated device data  Hd completed  

## 2017-12-01 NOTE — Progress Notes (Signed)
Report called to facility, [facility] LPN. Will request transfer as soon as patient is done with HD.

## 2017-12-01 NOTE — Progress Notes (Signed)
Pharmacy Antibiotic Note  Johnny [name]. is a 67 y.o. male admitted on 11/27/2017 with cellulitis.  Pharmacy has been consulted for Zosyn and vancomycin dosing.  Plan: 1. Zosyn 3.375 g IV Q12H EI 2. Vancomycin 1500 mg IV x 1 followed by vancomycin 750 mg IV Q-dialysis MWF, will check first level next Monday AM before 3rd HD session.   Vancomycin Doses: 7/23 - 1500 mg IV x1 loading dose 7/24 - 750 mg dose with HD 7/26 - due for HD today  Height: 5\' 7"  (170.2 cm) Weight: 154 lb 11.2 oz (70.2 kg) IBW/kg (Calculated) : 66.1  Temp (24hrs), Avg:97.8 F (36.6 C), Min:97.7 F (36.5 C), Max:97.9 F (36.6 C)  Recent Labs  Lab 11/27/17 1526 11/28/17 0425 11/29/17 0802  WBC 12.4* 10.3 10.6  CREATININE 6.89* 8.33* 10.65*    Estimated Creatinine Clearance: 6.2 mL/min (A) (by C-G formula based on SCr of 10.65 mg/dL (H)).    Allergies  Allergen Reactions  . Shellfish Allergy Anaphylaxis    Antimicrobials this admission: Ceftriaxone 7/22>>7/23 Vanc/Zosyn 7/23>>  Dose adjustments this admission:   Microbiology results: MRSA PCR: neg  Thank you for allowing pharmacy to be a part of this patient's care.  Rocky Morel, Pharm.D., BCPS Clinical Pharmacist 12/01/2017 10:47 AM

## 2017-12-01 NOTE — Clinical Social Work Note (Signed)
Patient's discharge was held last evening due to patient having vomiting. Facility made aware that plan is for patient to discharge after dialysis. Shela Leff MSW,LCSW [phone]

## 2017-12-07 ENCOUNTER — Encounter (INDEPENDENT_AMBULATORY_CARE_PROVIDER_SITE_OTHER): Payer: Self-pay | Admitting: Vascular Surgery

## 2017-12-07 ENCOUNTER — Ambulatory Visit (INDEPENDENT_AMBULATORY_CARE_PROVIDER_SITE_OTHER): Payer: Medicare Other | Admitting: Vascular Surgery

## 2017-12-07 VITALS — BP 188/86 | HR 83 | Resp 16

## 2017-12-07 DIAGNOSIS — I10 Essential (primary) hypertension: Secondary | ICD-10-CM

## 2017-12-07 DIAGNOSIS — N186 End stage renal disease: Secondary | ICD-10-CM | POA: Diagnosis not present

## 2017-12-07 DIAGNOSIS — I7025 Atherosclerosis of native arteries of other extremities with ulceration: Secondary | ICD-10-CM

## 2017-12-07 DIAGNOSIS — I5032 Chronic diastolic (congestive) heart failure: Secondary | ICD-10-CM

## 2017-12-07 DIAGNOSIS — Z992 Dependence on renal dialysis: Secondary | ICD-10-CM

## 2017-12-07 NOTE — Progress Notes (Signed)
MRN : [id number]  Johnny [name]. is a 67 y.o. (05-Aug-1950) male who presents with chief complaint of  Chief Complaint  Patient presents with  . Follow-up    [facility] 1week   .  History of Present Illness: The patient returns to the office for followup and review status post angiogram with intervention. The patient notes improvement in the lower extremity symptoms. No interval shortening of the patient's claudication distance or rest pain symptoms. Previous wounds have been stable, noninfected.  No new ulcers or wounds have occurred since the last visit.  There have been no significant changes to the patient's overall health care.  The patient denies amaurosis fugax or recent TIA symptoms. There are no recent neurological changes noted. The patient denies history of DVT, PE or superficial thrombophlebitis. The patient denies recent episodes of angina or shortness of breath.     No outpatient medications have been marked as taking for the 12/07/17 encounter (Office Visit) with Delana Meyer, Dolores Lory, MD.    Past Medical History:  Diagnosis Date  . Anemia   . CHF (congestive heart failure) ([place])   . Chronic kidney disease   . Gout   . Hyperlipidemia   . Hypertension     Past Surgical History:  Procedure Laterality Date  . A/V SHUNTOGRAM Left 06/21/2017   Procedure: A/V SHUNTOGRAM;  Surgeon: Katha Cabal, MD;  Location: [place] [facility];  Service: Cardiovascular;  Laterality: Left;  . AV FISTULA PLACEMENT Left 09/18/2015   Procedure: INSERTION OF ARTERIOVENOUS (AV) GORE-TEX GRAFT ARM ( BRACH/AXILLARY GRAFT W/ INSTANT STICK GRAFT );  Surgeon: Katha Cabal, MD;  Location: [facility];  Service: Vascular;  Laterality: Left;  . DIALYSIS FISTULA CREATION    . LOWER EXTREMITY ANGIOGRAPHY Left 11/16/2017   Procedure: LOWER EXTREMITY ANGIOGRAPHY;  Surgeon: Algernon Huxley, MD;  Location: [place] [facility];  Service: Cardiovascular;  Laterality: Left;  . PERIPHERAL VASCULAR  CATHETERIZATION Left 09/01/2015   Procedure: A/V Shuntogram/Fistulagram;  Surgeon: Katha Cabal, MD;  Location: [place] [facility];  Service: Cardiovascular;  Laterality: Left;  . PERIPHERAL VASCULAR CATHETERIZATION N/A 09/30/2015   Procedure: A/V Shuntogram/Fistulagram with perm cathether removal;  Surgeon: Algernon Huxley, MD;  Location: [place] [facility];  Service: Cardiovascular;  Laterality: N/A;  . PERIPHERAL VASCULAR CATHETERIZATION Left 09/30/2015   Procedure: A/V Shunt Intervention;  Surgeon: Algernon Huxley, MD;  Location: [place] [facility];  Service: Cardiovascular;  Laterality: Left;  . PERIPHERAL VASCULAR CATHETERIZATION Left 12/03/2015   Procedure: Thrombectomy;  Surgeon: Algernon Huxley, MD;  Location: [place] [facility];  Service: Cardiovascular;  Laterality: Left;  . PERIPHERAL VASCULAR CATHETERIZATION Left 01/28/2016   Procedure: Thrombectomy;  Surgeon: Algernon Huxley, MD;  Location: [place] [facility];  Service: Cardiovascular;  Laterality: Left;  . PERIPHERAL VASCULAR CATHETERIZATION N/A 01/28/2016   Procedure: A/V Shuntogram/Fistulagram;  Surgeon: Algernon Huxley, MD;  Location: [place] [facility];  Service: Cardiovascular;  Laterality: N/A;    Social History Social History   Tobacco Use  . Smoking status: Former Research scientist (life sciences)  . Smokeless tobacco: Never Used  Substance Use Topics  . Alcohol use: No  . Drug use: No    Family History Family History  Problem Relation Age of Onset  . Hypertension Unknown   . Heart disease Unknown     Allergies  Allergen Reactions  . Shellfish Allergy Anaphylaxis     REVIEW OF SYSTEMS (Negative unless checked)  Constitutional: [] Weight loss  []   Fever  [] Chills Cardiac: [] Chest pain   [] Chest pressure   [] Palpitations   [] Shortness of breath when laying flat   [] Shortness of breath with exertion. Vascular:  [x] Pain in legs with walking   [] Pain in legs at rest  [] History of DVT   [] Phlebitis   [x] Swelling in legs   [] Varicose veins    [x] Non-healing ulcers Pulmonary:   [] Uses home oxygen   [] Productive cough   [] Hemoptysis   [] Wheeze  [] COPD   [] Asthma Neurologic:  [] Dizziness   [] Seizures   [] History of stroke   [] History of TIA  [] Aphasia   [] Vissual changes   [] Weakness or numbness in arm   [] Weakness or numbness in leg Musculoskeletal:   [x] Joint swelling   [] Joint pain   [] Low back pain Hematologic:  [] Easy bruising  [] Easy bleeding   [] Hypercoagulable state   [] Anemic Gastrointestinal:  [] Diarrhea   [] Vomiting  [] Gastroesophageal reflux/heartburn   [] Difficulty swallowing. Genitourinary:  [x] Chronic kidney disease   [] Difficult urination  [] Frequent urination   [] Blood in urine Skin:  [] Rashes   [] Ulcers  Psychological:  [] History of anxiety   []  History of major depression.  Physical Examination  There were no vitals filed for this visit. There is no height or weight on file to calculate BMI. Gen: WD/WN, NAD Head: [place]/AT, No temporalis wasting.  Ear/Nose/Throat: Hearing grossly intact, nares w/o erythema or drainage Eyes: PER, EOMI, sclera nonicteric.  Neck: Supple, no large masses.   Pulmonary:  Good air movement, no audible wheezing bilaterally, no use of accessory muscles.  Cardiac: RRR, no JVD Vascular: Left arm brachial axillary access good thrill good bruit.  3+ edema of the forefoot ankle area left leg Vessel Right Left  Radial Palpable Palpable  Ulnar Palpable Palpable  Brachial Palpable Palpable  PT  not palpable  2+ palpable  DP  not palpable  not palpable  Gastrointestinal: Non-distended. No guarding/no peritoneal signs.  Musculoskeletal: M/S 5/5 throughout.  No deformity or atrophy.  Neurologic: CN 2-12 intact. Symmetrical.  Speech is fluent. Motor exam as listed above. Psychiatric: Judgment intact, Mood & affect appropriate for pt's clinical situation. Dermatologic: Moderate venous rashes left lateral heel ulcers noted with granulation appears mixed arterial and venous in character.  No changes  consistent with cellulitis. Lymph : No lichenification or skin changes of chronic lymphedema.  CBC Lab Results  Component Value Date   WBC 12.5 (H) 12/01/2017   HGB 9.1 (L) 12/01/2017   HCT 27.6 (L) 12/01/2017   MCV 94.7 12/01/2017   PLT 285 12/01/2017    BMET    Component Value Date/Time   NA 140 11/29/2017 0802   NA 139 01/17/2014 0502   K 4.4 11/29/2017 0802   K 4.8 01/17/2014 0502   CL 97 (L) 11/29/2017 0802   CL 102 01/17/2014 0502   CO2 32 11/29/2017 0802   CO2 27 01/17/2014 0502   GLUCOSE 83 11/29/2017 0802   GLUCOSE 101 (H) 01/17/2014 0502   BUN 34 (H) 11/29/2017 0802   BUN 62 (H) 01/17/2014 0502   CREATININE 10.65 (H) 11/29/2017 0802   CREATININE 12.30 (H) 01/17/2014 0502   CALCIUM 9.2 11/29/2017 0802   CALCIUM 8.2 (L) 01/17/2014 0502   GFRNONAA 4 (L) 11/29/2017 0802   GFRNONAA 4 (L) 01/17/2014 0502   GFRAA 5 (L) 11/29/2017 0802   GFRAA 4 (L) 01/17/2014 0502   Estimated Creatinine Clearance: 6.2 mL/min (A) (by C-G formula based on SCr of 10.65 mg/dL (H)).  COAG Lab Results  Component  Value Date   INR 1.18 09/28/2015   INR 1.04 09/15/2015   INR 1.0 06/11/2011    Radiology Dg Abd 1 View  Result Date: 11/30/2017 CLINICAL DATA:  Nausea, vomiting since yesterday EXAM: ABDOMEN - 1 VIEW COMPARISON:  None. FINDINGS: There is a non obstructive bowel gas pattern. No supine evidence of free air. No organomegaly or suspicious calcification. No acute bony abnormality. Vascular calcifications. No visible aneurysm. IMPRESSION: No acute findings.  Extensive vascular calcifications. Electronically Signed   By: Rolm Baptise M.D.   On: 11/30/2017 18:28   Mr Ankle Left Wo Contrast  Result Date: 11/29/2017 CLINICAL DATA:  Nonhealing left heel ulcer. Evaluate for osteomyelitis. Recent left femoral stent placement for arterial occlusion. EXAM: MRI OF THE LEFT ANKLE WITHOUT CONTRAST TECHNIQUE: Multiplanar, multisequence MR imaging of the ankle was performed. No intravenous  contrast was administered. COMPARISON:  Left foot x-rays from yesterday. FINDINGS: TENDONS Peroneal: Peroneal longus tendon intact. Peroneal brevis intact. Posteromedial: Posterior tibial tendon intact. Flexor hallucis longus tendon intact. Flexor digitorum longus tendon intact. Anterior: Tibialis anterior tendon intact. Extensor hallucis longus tendon intact Extensor digitorum longus tendon intact. Achilles:  Intact. Mild thickening of the Achilles tendon. Plantar Fascia: Intact. LIGAMENTS Lateral: Anterior talofibular ligament intact. Calcaneofibular ligament intact. Posterior talofibular ligament intact. Anterior and posterior tibiofibular ligaments intact. Medial: Deltoid ligament intact. Spring ligament intact. Lisfranc ligament intact. CARTILAGE Ankle Joint: No joint effusion. Normal ankle mortise. No chondral defect. Subtalar Joints/Sinus Tarsi: Normal subtalar joints. No subtalar joint effusion. Normal sinus tarsi. Bones: No marrow signal abnormality.  No fracture or dislocation. Soft Tissue: Increased T2 signal within the intrinsic muscles of the foot, nonspecific, but possibly related to reperfusion injury. No fluid collection or hematoma. No soft tissue mass. Mild soft tissue swelling over the medial and lateral malleoli. IMPRESSION: 1.  No acute osseous abnormality.  No evidence of osteomyelitis. 2. Mild Achilles tendinosis. 3. Increased edema within the intrinsic muscles of the foot is nonspecific but may be related to reperfusion injury given clinical history. Electronically Signed   By: Titus Dubin M.D.   On: 11/29/2017 10:14   Nm Myocar Multi W/spect W/wall Motion / Ef  Result Date: 11/20/2017  The study is normal.  This is a low risk study.  The left ventricular ejection fraction is normal (55-65%).  Blood pressure demonstrated a normal response to exercise.  There was no ST segment deviation noted during stress.    US Arterial Abi (screening Lower Extremity)  Result Date:  11/22/2017 CLINICAL DATA:  67 year old male with left lower extremity intermittent claudication EXAM: NONINVASIVE PHYSIOLOGIC VASCULAR STUDY OF LOWER EXTREMITY TECHNIQUE: Evaluation of both lower extremities were performed at rest, including calculation of ankle-brachial indices with single level Doppler, pressure and pulse volume recording. COMPARISON:  None. FINDINGS: Left ABI:  1.1 Left Lower Extremity: The dorsalis pedis is noncompressible. The arterial waveforms are abnormal, blunted and biphasic. IMPRESSION: Very limited evaluation. Although the resting left ankle-brachial index measures within normal range, it may be falsely elevated secondary to underlying poor compressibility of the arteries. The arterial waveforms appear abnormal. If clinical concern persists for lower extremity peripheral arterial disease, recommend further evaluation with CT arteriogram including runoff. Electronically Signed   By: Jacqulynn Cadet M.D.   On: 11/22/2017 07:53   [name] Lower Ext Art Left  Result Date: 11/18/2017 CLINICAL DATA:  67 year old male with left leg pain and swelling. Recent left distal SFA stent placement on 11/16/2017. Evaluate for stent patency. EXAM: Left LOWER EXTREMITY ARTERIAL DUPLEX  SCAN TECHNIQUE: Limited Gray-scale sonography as well as color Doppler and duplex ultrasound was performed to evaluate the lower extremity arterial stent . COMPARISON:  None. FINDINGS: Limited evaluation of the left lower extremity arteries in the region of the stent demonstrate patency of the stent and vascular segment immediately proximal and distal to the stent. The peak systolic velocity within the stent is in the range of 74-115 centimeter/second. There is no parvus tardus. IMPRESSION: Patent appearing stent on limited exam. Electronically Signed   By: Anner Crete M.D.   On: 11/18/2017 03:50   Dg Chest Port 1 View  Result Date: 11/19/2017 CLINICAL DATA:  Shortness of breath EXAM: PORTABLE CHEST 1 VIEW  COMPARISON:  None. FINDINGS: The heart size and mediastinal contours are within normal limits. Both lungs are clear. The visualized skeletal structures are unremarkable. IMPRESSION: No active disease. Electronically Signed   By: Ulyses Jarred M.D.   On: 11/19/2017 02:39   Dg Foot Complete Left  Result Date: 11/27/2017 CLINICAL DATA:  Left leg pain and swelling, history of left leg revascularization EXAM: LEFT FOOT - COMPLETE 3+ VIEW COMPARISON:  11/19/2017 FINDINGS: Degenerative changes of the first MTP joint are again seen. No acute fracture or dislocation is noted. Diffuse vascular calcifications are seen. No other gross soft tissue abnormality is noted. IMPRESSION: Chronic changes without acute abnormality. Electronically Signed   By: Inez Catalina M.D.   On: 11/27/2017 15:31   Dg Foot Complete Left  Result Date: 11/19/2017 CLINICAL DATA:  Foot ulcer EXAM: LEFT FOOT - COMPLETE 3+ VIEW COMPARISON:  11/18/2017 FINDINGS: Unchanged soft tissue ulceration at the lateral aspect of the left hindfoot. No evidence of active osteomyelitis. IMPRESSION: Unchanged examination with soft tissue ulceration at the lateral left hindfoot. No evidence of active infectious osteitis. Electronically Signed   By: Ulyses Jarred M.D.   On: 11/19/2017 02:58   Dg Foot Complete Left  Result Date: 11/18/2017 CLINICAL DATA:  Left foot ulceration EXAM: LEFT FOOT - COMPLETE 3+ VIEW COMPARISON:  None. 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 Extensive vascular calcification. No acute fracture or dislocation. Small focus of soft tissue ulceration along the lateral surface of the foot, at approximately the level of the metatarsal shaft. IMPRESSION: 1. No acute abnormality of the left foot. Small focus of lateral soft tissue ulceration. 2. Unchanged first metatarsophalangeal joint osteoarthrosis. Electronically Signed   By: Ulyses Jarred M.D.   On: 11/18/2017 05:36      Assessment/Plan 1. Atherosclerosis of native arteries of the extremities with ulceration ([place]) Recommend:  The patient is status post successful angiogram with intervention.  The patient reports that the claudication symptoms and leg pain is essentially gone.   The patient denies lifestyle limiting changes at this point in time.  No further invasive studies, angiography or surgery at this time The patient should continue walking and begin a more formal exercise program.  The patient should continue antiplatelet therapy and aggressive treatment of the lipid abnormalities  No further surgery or intervention at this point in time with respect to the left leg.    I have also had a long discussion with the patient regarding venous insufficiency and why it  causes symptoms, specifically venous ulceration . I have discussed with the patient the chronic skin changes that accompany venous insufficiency and the long term sequela such as infection and recurring  ulceration.  Patient will be placed in [employer] which will be changed weekly drainage permitting.  In addition,  behavioral modification including several periods of elevation of the lower extremities during the day will be continued. Achieving a position with the ankles at heart level was stressed to the patient  The patient is instructed to begin routine exercise, especially walking on a daily basis  The patient will be placed in an Unna wrap left leg today  Following the review of the ultrasound the patient will follow up in one week to reassess the degree of swelling and the control that Unna therapy is offering.  The patient can be assessed for graduated compression stockings or wraps as well as a Lymph Pump once the ulcers are healed.  Patient should undergo noninvasive studies as ordered. The patient will follow up with me after the studies.   - VAS [name] LOWER EXTREMITY ARTERIAL DUPLEX; Future - VAS [name] ABI WITH/WO TBI;  Future  2. ESRD on dialysis [facility]) Continue hemodialysis without interruption currently his access is functioning well follow-up regarding surveillance with duplex ultrasound as already arranged.  3. Hypertension, unspecified type Continue antihypertensive medications as already ordered, these medications have been reviewed and there are no changes at this time.   4. Chronic diastolic CHF (congestive heart failure) ([facility]) Continue cardiac and antihypertensive medications as already ordered and reviewed, no changes at this time.  Continue statin as ordered and reviewed, no changes at this time   Hortencia Pilar, MD  12/07/2017 10:15 AM

## 2017-12-08 ENCOUNTER — Encounter (INDEPENDENT_AMBULATORY_CARE_PROVIDER_SITE_OTHER): Payer: Self-pay | Admitting: Vascular Surgery

## 2017-12-13 ENCOUNTER — Encounter (INDEPENDENT_AMBULATORY_CARE_PROVIDER_SITE_OTHER): Payer: Self-pay

## 2017-12-13 ENCOUNTER — Ambulatory Visit (INDEPENDENT_AMBULATORY_CARE_PROVIDER_SITE_OTHER): Payer: Medicare Other | Admitting: Vascular Surgery

## 2017-12-13 DIAGNOSIS — I890 Lymphedema, not elsewhere classified: Secondary | ICD-10-CM | POA: Diagnosis not present

## 2017-12-13 NOTE — Progress Notes (Signed)
History of Present Illness  There is no documented history at this time  Assessments & Plan   There are no diagnoses linked to this encounter.    Additional instructions  Subjective:  Patient presents with venous ulcer of the Left lower extremity.    Procedure:  3 layer unna wrap was placed Left lower extremity.   Plan:   Follow up in one week.  

## 2017-12-18 ENCOUNTER — Other Ambulatory Visit: Payer: Self-pay

## 2017-12-18 ENCOUNTER — Emergency Department: Payer: Medicare Other

## 2017-12-18 ENCOUNTER — Inpatient Hospital Stay
Admission: EM | Admit: 2017-12-18 | Discharge: 2017-12-25 | DRG: 073 | Disposition: A | Discharge status: Skilled Nursing Facility | Payer: Medicare Other | Attending: Internal Medicine | Admitting: Internal Medicine

## 2017-12-18 ENCOUNTER — Encounter: Payer: Self-pay | Admitting: Radiology

## 2017-12-18 DIAGNOSIS — Z833 Family history of diabetes mellitus: Secondary | ICD-10-CM

## 2017-12-18 DIAGNOSIS — I5032 Chronic diastolic (congestive) heart failure: Secondary | ICD-10-CM | POA: Diagnosis present

## 2017-12-18 DIAGNOSIS — E1143 Type 2 diabetes mellitus with diabetic autonomic (poly)neuropathy: Secondary | ICD-10-CM | POA: Diagnosis not present

## 2017-12-18 DIAGNOSIS — Z955 Presence of coronary angioplasty implant and graft: Secondary | ICD-10-CM

## 2017-12-18 DIAGNOSIS — D631 Anemia in chronic kidney disease: Secondary | ICD-10-CM | POA: Diagnosis present

## 2017-12-18 DIAGNOSIS — I132 Hypertensive heart and chronic kidney disease with heart failure and with stage 5 chronic kidney disease, or end stage renal disease: Secondary | ICD-10-CM | POA: Diagnosis present

## 2017-12-18 DIAGNOSIS — L97529 Non-pressure chronic ulcer of other part of left foot with unspecified severity: Secondary | ICD-10-CM | POA: Diagnosis present

## 2017-12-18 DIAGNOSIS — Z992 Dependence on renal dialysis: Secondary | ICD-10-CM

## 2017-12-18 DIAGNOSIS — E785 Hyperlipidemia, unspecified: Secondary | ICD-10-CM | POA: Diagnosis present

## 2017-12-18 DIAGNOSIS — Z87891 Personal history of nicotine dependence: Secondary | ICD-10-CM

## 2017-12-18 DIAGNOSIS — R1033 Periumbilical pain: Secondary | ICD-10-CM

## 2017-12-18 DIAGNOSIS — E43 Unspecified severe protein-calorie malnutrition: Secondary | ICD-10-CM

## 2017-12-18 DIAGNOSIS — B353 Tinea pedis: Secondary | ICD-10-CM | POA: Diagnosis not present

## 2017-12-18 DIAGNOSIS — R112 Nausea with vomiting, unspecified: Secondary | ICD-10-CM | POA: Diagnosis present

## 2017-12-18 DIAGNOSIS — I10 Essential (primary) hypertension: Secondary | ICD-10-CM | POA: Diagnosis present

## 2017-12-18 DIAGNOSIS — R509 Fever, unspecified: Secondary | ICD-10-CM

## 2017-12-18 DIAGNOSIS — R410 Disorientation, unspecified: Secondary | ICD-10-CM

## 2017-12-18 DIAGNOSIS — L97329 Non-pressure chronic ulcer of left ankle with unspecified severity: Secondary | ICD-10-CM | POA: Diagnosis present

## 2017-12-18 DIAGNOSIS — R7989 Other specified abnormal findings of blood chemistry: Secondary | ICD-10-CM

## 2017-12-18 DIAGNOSIS — I429 Cardiomyopathy, unspecified: Secondary | ICD-10-CM | POA: Diagnosis present

## 2017-12-18 DIAGNOSIS — Z7902 Long term (current) use of antithrombotics/antiplatelets: Secondary | ICD-10-CM

## 2017-12-18 DIAGNOSIS — N2581 Secondary hyperparathyroidism of renal origin: Secondary | ICD-10-CM | POA: Diagnosis present

## 2017-12-18 DIAGNOSIS — E11621 Type 2 diabetes mellitus with foot ulcer: Secondary | ICD-10-CM | POA: Diagnosis present

## 2017-12-18 DIAGNOSIS — Z91013 Allergy to seafood: Secondary | ICD-10-CM

## 2017-12-18 DIAGNOSIS — E1122 Type 2 diabetes mellitus with diabetic chronic kidney disease: Secondary | ICD-10-CM | POA: Diagnosis present

## 2017-12-18 DIAGNOSIS — E1151 Type 2 diabetes mellitus with diabetic peripheral angiopathy without gangrene: Secondary | ICD-10-CM | POA: Diagnosis present

## 2017-12-18 DIAGNOSIS — Z79899 Other long term (current) drug therapy: Secondary | ICD-10-CM

## 2017-12-18 DIAGNOSIS — K2970 Gastritis, unspecified, without bleeding: Secondary | ICD-10-CM | POA: Diagnosis present

## 2017-12-18 DIAGNOSIS — Z8249 Family history of ischemic heart disease and other diseases of the circulatory system: Secondary | ICD-10-CM

## 2017-12-18 DIAGNOSIS — N186 End stage renal disease: Secondary | ICD-10-CM

## 2017-12-18 DIAGNOSIS — K5900 Constipation, unspecified: Secondary | ICD-10-CM | POA: Diagnosis present

## 2017-12-18 DIAGNOSIS — M064 Inflammatory polyarthropathy: Secondary | ICD-10-CM | POA: Diagnosis present

## 2017-12-18 DIAGNOSIS — K3184 Gastroparesis: Secondary | ICD-10-CM | POA: Diagnosis present

## 2017-12-18 DIAGNOSIS — I7025 Atherosclerosis of native arteries of other extremities with ulceration: Secondary | ICD-10-CM | POA: Diagnosis present

## 2017-12-18 LAB — CBC WITH DIFFERENTIAL/PLATELET
BASOS ABS: 0.1 10*3/uL (ref 0–0.1)
BASOS PCT: 1 %
Eosinophils Absolute: 0.2 10*3/uL (ref 0–0.7)
Eosinophils Relative: 2 %
HEMATOCRIT: 33.4 % — AB (ref 40.0–52.0)
Hemoglobin: 11 g/dL — ABNORMAL LOW (ref 13.0–18.0)
LYMPHS PCT: 11 %
Lymphs Abs: 0.9 10*3/uL — ABNORMAL LOW (ref 1.0–3.6)
MCH: 31.8 pg (ref 26.0–34.0)
MCHC: 32.8 g/dL (ref 32.0–36.0)
MCV: 97.1 fL (ref 80.0–100.0)
MONO ABS: 0.8 10*3/uL (ref 0.2–1.0)
Monocytes Relative: 11 %
NEUTROS ABS: 5.9 10*3/uL (ref 1.4–6.5)
Neutrophils Relative %: 75 %
PLATELETS: 217 10*3/uL (ref 150–440)
RBC: 3.44 MIL/uL — ABNORMAL LOW (ref 4.40–5.90)
RDW: 17.4 % — AB (ref 11.5–14.5)
WBC: 7.8 10*3/uL (ref 3.8–10.6)

## 2017-12-18 LAB — COMPREHENSIVE METABOLIC PANEL
ALT: 6 U/L (ref 0–44)
AST: 24 U/L (ref 15–41)
Albumin: 3.8 g/dL (ref 3.5–5.0)
Alkaline Phosphatase: 55 U/L (ref 38–126)
Anion gap: 11 (ref 5–15)
BILIRUBIN TOTAL: 0.7 mg/dL (ref 0.3–1.2)
BUN: 11 mg/dL (ref 8–23)
CHLORIDE: 96 mmol/L — AB (ref 98–111)
CO2: 29 mmol/L (ref 22–32)
Calcium: 9.7 mg/dL (ref 8.9–10.3)
Creatinine, Ser: 5.74 mg/dL — ABNORMAL HIGH (ref 0.61–1.24)
GFR calc Af Amer: 10 mL/min — ABNORMAL LOW (ref >60)
GFR, EST NON AFRICAN AMERICAN: 9 mL/min — AB (ref >60)
Glucose, Bld: 91 mg/dL (ref 70–99)
POTASSIUM: 3.5 mmol/L (ref 3.5–5.1)
Sodium: 136 mmol/L (ref 135–145)
Total Protein: 7.8 g/dL (ref 6.5–8.1)

## 2017-12-18 LAB — LIPASE, BLOOD: Lipase: 30 U/L (ref 11–51)

## 2017-12-18 LAB — TROPONIN I: Troponin I: 0.04 ng/mL (ref <0.03)

## 2017-12-18 MED ORDER — IOHEXOL 350 MG/ML SOLN
100.0000 mL | Freq: Once | INTRAVENOUS | Status: AC | PRN
  Administered 2017-12-18: 100 mL via INTRAVENOUS

## 2017-12-18 MED ORDER — ONDANSETRON 4 MG PO TBDP: 4.0000 mg | ORAL_TABLET | Freq: Three times a day (TID) | ORAL | 0 refills | Status: DC | PRN

## 2017-12-18 MED ORDER — LABETALOL HCL 5 MG/ML IV SOLN
10.0000 mg | Freq: Once | INTRAVENOUS | Status: AC
  Administered 2017-12-18: 10 mg via INTRAVENOUS
  Filled 2017-12-18: qty 4

## 2017-12-18 MED ORDER — MORPHINE SULFATE (PF) 2 MG/ML IV SOLN
2.0000 mg | Freq: Once | INTRAVENOUS | Status: AC
  Administered 2017-12-18: 2 mg via INTRAVENOUS
  Filled 2017-12-18: qty 1

## 2017-12-18 NOTE — ED Provider Notes (Addendum)
[facility] Emergency Department Provider Note       Time seen: ----------------------------------------- 7:01 PM on 12/18/2017 -----------------------------------------   I have reviewed the triage vital signs and the nursing notes.  HISTORY   Chief Complaint No chief complaint on file.    HPI Johnny Thang. is a 67 y.o. male with a history of anemia, CHF, chronic kidney disease, gout, hyperlipidemia, hypertension who presents to the ED for abdominal pain and vomiting.  Patient had abdominal pain which has been central in the abdomen and in the periumbilical area on and off for several months.  He is also had some vomiting associated with it and possibly some constipation.  He is on dialysis, last dialysis treatment was normal.  He has not had pain during dialysis.  Family describes significant weight loss.  Past Medical History:  Diagnosis Date  . Anemia   . CHF (congestive heart failure) ([place])   . Chronic kidney disease   . Gout   . Hyperlipidemia   . Hypertension     Patient Active Problem List   Diagnosis Date Noted  . Lymphedema 12/13/2017  . Cellulitis 11/27/2017  . Chest pain 11/19/2017  . Atherosclerosis of native arteries of the extremities with ulceration ([place]) 11/07/2017  . Elevated troponin 10/02/2015  . Complications, dialysis, catheter, mechanical ([place]) 10/02/2015  . Musculoskeletal chest pain 09/28/2015  . ESRD on dialysis ([place]) 09/28/2015  . HTN (hypertension) 09/28/2015  . Chronic diastolic CHF (congestive heart failure) ([place]) 09/28/2015  . Gout 09/28/2015    Past Surgical History:  Procedure Laterality Date  . A/V SHUNTOGRAM Left 06/21/2017   Procedure: A/V SHUNTOGRAM;  Surgeon: Katha Cabal, MD;  Location: [place] [facility];  Service: Cardiovascular;  Laterality: Left;  . AV FISTULA PLACEMENT Left 09/18/2015   Procedure: INSERTION OF ARTERIOVENOUS (AV) GORE-TEX GRAFT ARM ( BRACH/AXILLARY GRAFT W/ INSTANT STICK  GRAFT );  Surgeon: Katha Cabal, MD;  Location: [facility];  Service: Vascular;  Laterality: Left;  . DIALYSIS FISTULA CREATION    . LOWER EXTREMITY ANGIOGRAPHY Left 11/16/2017   Procedure: LOWER EXTREMITY ANGIOGRAPHY;  Surgeon: Algernon Huxley, MD;  Location: [place] [facility];  Service: Cardiovascular;  Laterality: Left;  . PERIPHERAL VASCULAR CATHETERIZATION Left 09/01/2015   Procedure: A/V Shuntogram/Fistulagram;  Surgeon: Katha Cabal, MD;  Location: [place] [facility];  Service: Cardiovascular;  Laterality: Left;  . PERIPHERAL VASCULAR CATHETERIZATION N/A 09/30/2015   Procedure: A/V Shuntogram/Fistulagram with perm cathether removal;  Surgeon: Algernon Huxley, MD;  Location: [place] [facility];  Service: Cardiovascular;  Laterality: N/A;  . PERIPHERAL VASCULAR CATHETERIZATION Left 09/30/2015   Procedure: A/V Shunt Intervention;  Surgeon: Algernon Huxley, MD;  Location: [place] [facility];  Service: Cardiovascular;  Laterality: Left;  . PERIPHERAL VASCULAR CATHETERIZATION Left 12/03/2015   Procedure: Thrombectomy;  Surgeon: Algernon Huxley, MD;  Location: [place] [facility];  Service: Cardiovascular;  Laterality: Left;  . PERIPHERAL VASCULAR CATHETERIZATION Left 01/28/2016   Procedure: Thrombectomy;  Surgeon: Algernon Huxley, MD;  Location: [place] [facility];  Service: Cardiovascular;  Laterality: Left;  . PERIPHERAL VASCULAR CATHETERIZATION N/A 01/28/2016   Procedure: A/V Shuntogram/Fistulagram;  Surgeon: Algernon Huxley, MD;  Location: [place] [facility];  Service: Cardiovascular;  Laterality: N/A;    Allergies Shellfish allergy  Social History Social History   Tobacco Use  . Smoking status: Former Research scientist (life sciences)  . Smokeless tobacco: Never Used  Substance Use Topics  . Alcohol use: No  .  Drug use: No   Review of Systems Constitutional: Negative for fever.  Positive for weight loss Cardiovascular: Negative for chest pain. Respiratory: Negative for shortness of breath. Gastrointestinal:  Positive of her abdominal pain, vomiting Musculoskeletal: Negative for back pain. Skin: Negative for rash. Neurological: Negative for headaches, focal weakness or numbness.  All systems negative/normal/unremarkable except as stated in the HPI  ____________________________________________   PHYSICAL EXAM:  VITAL SIGNS: ED Triage Vitals  Enc Vitals Group     BP      Pulse      Resp      Temp      Temp src      SpO2      Weight      Height      Head Circumference      Peak Flow      Pain Score      Pain Loc      Pain Edu?      Excl. in [place]?    Constitutional: Alert and oriented. Well appearing and in no distress. Eyes: Conjunctivae are normal. Normal extraocular movements. Cardiovascular: Normal rate, regular rhythm. No murmurs, rubs, or gallops. Respiratory: Normal respiratory effort without tachypnea nor retractions. Breath sounds are clear and equal bilaterally. No wheezes/rales/rhonchi. Gastrointestinal: Soft and nontender. Normal bowel sounds Musculoskeletal: Nontender with normal range of motion in extremities. No lower extremity tenderness nor edema. Neurologic:  Normal speech and language. No gross focal neurologic deficits are appreciated.  Skin:  Skin is warm, dry and intact. No rash noted. Psychiatric: Mood and affect are normal. Speech and behavior are normal.  ____________________________________________  ED COURSE:  As part of my medical decision making, I reviewed the following data within the [place] History obtained from family if available, nursing notes, old chart and ekg, as well as notes from prior ED visits. Patient presented for abdominal pain and vomiting which is acute on chronic, we will assess with labs and imaging as indicated at this time. Clinical Course as of Dec 19 2107  Mon Dec 18, 2017  1947 CT Angio Abd/Pel W and/or Wo Contrast [JW]    Clinical Course User Index [JW] Earleen Newport, MD    Procedures ____________________________________________   LABS (pertinent positives/negatives)  Labs Reviewed  CBC WITH DIFFERENTIAL/PLATELET - Abnormal; Notable for the following components:      Result Value   RBC 3.44 (*)    Hemoglobin 11.0 (*)    HCT 33.4 (*)    RDW 17.4 (*)    Lymphs Abs 0.9 (*)    All other components within normal limits  COMPREHENSIVE METABOLIC PANEL - Abnormal; Notable for the following components:   Chloride 96 (*)    Creatinine, Ser 5.74 (*)    GFR calc non Af Amer 9 (*)    GFR calc Af Amer 10 (*)    All other components within normal limits  TROPONIN I - Abnormal; Notable for the following components:   Troponin I 0.04 (*)    All other components within normal limits  LIPASE, BLOOD    RADIOLOGY Images were viewed by me  CT Angio of the abdomen/pelvis IMPRESSION: VASCULAR  1. 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 2. Possible hemodynamically significant stenosis involving the origin of the RIGHT main renal artery. Please note that there is a small accessory RIGHT UPPER pole renal artery which originates from the aorta just above the main renal artery. 3. Severe BILATERAL iliofemoral atherosclerosis with possible hemodynamically  significant stenoses involving both common femoral arteries. 4. 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  NON-VASCULAR  1. No acute abnormalities involving the abdomen or pelvis. 2. 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 3. Large BILATERAL inguinal hernias containing fat. High riding RIGHT testicle versus mass in the RIGHT inguinal canal. Please correlate with physical examination as to the location of the RIGHT testicle.   ____________________________________________  DIFFERENTIAL DIAGNOSIS   Mesenteric ischemia, obstruction, diverticulitis, gastroenteritis,  dehydration  FINAL ASSESSMENT AND PLAN  Abdominal pain, vomiting, end-stage renal disease on dialysis   Plan: The patient had presented for abdominal pain and vomiting. Patient's labs revealed chronic kidney disease but no other acute process. Patient's imaging did display known significant atherosclerosis but no stenosis of the celiac artery or SMA.  I have discussed with nephrology at this time.  Friend is concerned that he has not kept anything down essentially since July 27.  Patient states he does not want anything to eat or drink.  Perhaps we can admit him and have gastroenterology look at him.  I do not see a cause for his intractable vomiting and pain at this time.   Laurence Aly, MD   Note: This note was generated in part or whole with voice recognition software. Voice recognition is usually quite accurate but there are transcription errors that can and very often do occur. I apologize for any typographical errors that were not detected and corrected.     Earleen Newport, MD 12/18/17 2111    Earleen Newport, MD 12/18/17 2134

## 2017-12-18 NOTE — ED Notes (Addendum)
Pt IV blew at CT. This nurse attempted IV sticks twice and Larene Beach attempted IV with success.

## 2017-12-18 NOTE — H&P (Signed)
[facility] NAME: Lynton Crescenzo    MR#:  [id number]  DATE OF BIRTH:  09-Feb-1951  DATE OF ADMISSION:  12/18/2017  PRIMARY CARE PHYSICIAN: [name], MD   REQUESTING/REFERRING PHYSICIAN: Jimmye Norman, MD  CHIEF COMPLAINT:   Chief Complaint  Patient presents with  . Abdominal Pain    HISTORY OF PRESENT ILLNESS:  Stellan Vick  is a 67 y.o. male who presents with chief complaint as above.  Patient states that he has had significant nausea and vomiting for the past 2 weeks.  He had a revascularization procedure in his lower extremities a couple weeks ago with tenting.  Since that time he has had persistent nausea even just with the smell of food or the site of food, and episodes of significant vomiting with bilious emesis and significant retching.  Hospitalist were called for admission and further evaluation  PAST MEDICAL HISTORY:   Past Medical History:  Diagnosis Date  . Anemia   . CHF (congestive heart failure) ([facility])   . Chronic kidney disease   . Gout   . Hyperlipidemia   . Hypertension      PAST SURGICAL HISTORY:   Past Surgical History:  Procedure Laterality Date  . A/V SHUNTOGRAM Left 06/21/2017   Procedure: A/V SHUNTOGRAM;  Surgeon: Katha Cabal, MD;  Location: [facility];  Service: Cardiovascular;  Laterality: Left;  . AV FISTULA PLACEMENT Left 09/18/2015   Procedure: INSERTION OF ARTERIOVENOUS (AV) GORE-TEX GRAFT ARM ( BRACH/AXILLARY GRAFT W/ INSTANT STICK GRAFT );  Surgeon: Katha Cabal, MD;  Location: [facility];  Service: Vascular;  Laterality: Left;  . DIALYSIS FISTULA CREATION    . LOWER EXTREMITY ANGIOGRAPHY Left 11/16/2017   Procedure: LOWER EXTREMITY ANGIOGRAPHY;  Surgeon: Algernon Huxley, MD;  Location: [facility];  Service: Cardiovascular;  Laterality: Left;  . PERIPHERAL VASCULAR CATHETERIZATION Left 09/01/2015   Procedure: A/V Shuntogram/Fistulagram;  Surgeon: Katha Cabal, MD;  Location: [facility];  Service: Cardiovascular;  Laterality: Left;  . PERIPHERAL VASCULAR CATHETERIZATION N/A 09/30/2015   Procedure: A/V Shuntogram/Fistulagram with perm cathether removal;  Surgeon: Algernon Huxley, MD;  Location: [facility];  Service: Cardiovascular;  Laterality: N/A;  . PERIPHERAL VASCULAR CATHETERIZATION Left 09/30/2015   Procedure: A/V Shunt Intervention;  Surgeon: Algernon Huxley, MD;  Location: [facility];  Service: Cardiovascular;  Laterality: Left;  . PERIPHERAL VASCULAR CATHETERIZATION Left 12/03/2015   Procedure: Thrombectomy;  Surgeon: Algernon Huxley, MD;  Location: [place] [facility] [facility];  Service: Cardiovascular;  Laterality: Left;  . PERIPHERAL VASCULAR CATHETERIZATION Left 01/28/2016   Procedure: Thrombectomy;  Surgeon: Algernon Huxley, MD;  Location: [facility];  Service: Cardiovascular;  Laterality: Left;  . PERIPHERAL VASCULAR CATHETERIZATION N/A 01/28/2016   Procedure: A/V Shuntogram/Fistulagram;  Surgeon: Algernon Huxley, MD;  Location: [facility];  Service: Cardiovascular;  Laterality: N/A;     SOCIAL HISTORY:   Social History   Tobacco Use  . Smoking status: Former Research scientist (life sciences)  . Smokeless tobacco: Never Used  Substance Use Topics  . Alcohol use: No     FAMILY HISTORY:   Family History  Problem Relation Age of Onset  . Hypertension Unknown   . Heart disease Unknown      DRUG ALLERGIES:   Allergies  Allergen Reactions  . Shellfish Allergy Anaphylaxis    MEDICATIONS AT HOME:   Prior to Admission medications  Medication Sig Start Date End Date Taking? Authorizing Provider  allopurinol (ZYLOPRIM) 100 MG tablet Take 100 mg by mouth daily.   Yes [provider]  amLODipine (NORVASC) 10 MG tablet Take 10 mg by mouth daily.    Yes [provider]  atorvastatin (LIPITOR) 10 MG tablet Take 1 tablet by mouth daily. 11/23/17  Yes [provider]  calcium acetate (PHOSLO) 667 MG  capsule Take 1,334 mg by mouth 3 (three) times daily with meals.   Yes [provider]  Calcium Gluconate 50 MG TABS Take 50 mg by mouth 3 (three) times daily.  05/19/11  Yes [provider]  cinacalcet (SENSIPAR) 90 MG tablet Take 90 mg by mouth daily.    Yes [provider]  clonazePAM (KLONOPIN) 0.5 MG tablet Take 0.25 mg by mouth at bedtime.    Yes [provider]  clopidogrel (PLAVIX) 75 MG tablet Take 1 tablet (75 mg total) by mouth daily. 12/03/15  Yes Dew, Erskine Squibb, MD  colchicine 0.6 MG tablet Take 0.6 mg by mouth daily as needed (for gout flares).    Yes [provider]  collagenase (SANTYL) ointment Apply topically daily. 11/30/17  Yes Mody, Ulice Bold, MD  furosemide (LASIX) 40 MG tablet Take 40 mg by mouth daily.    Yes [provider]  gabapentin (NEURONTIN) 100 MG capsule Take 200 mg by mouth 2 (two) times daily.   Yes [provider]  hydrALAZINE (APRESOLINE) 25 MG tablet Take 25 mg by mouth daily.    Yes [provider]  irbesartan (AVAPRO) 300 MG tablet Take 300 mg by mouth daily.    Yes [provider]  lidocaine-prilocaine (EMLA) cream Apply 1 application topically as needed (port access).   Yes [provider]  pregabalin (LYRICA) 50 MG capsule Take 50 mg by mouth daily. After each dialysis session   Yes [provider]  sevelamer carbonate (RENVELA) 800 MG tablet Take 2,400 mg by mouth 3 (three) times daily with meals.    Yes [provider]  ondansetron (ZOFRAN ODT) 4 MG disintegrating tablet Take 1 tablet (4 mg total) by mouth every 8 (eight) hours as needed for nausea or vomiting. 12/18/17   Earleen Newport, MD  ondansetron (ZOFRAN) 4 MG tablet Take 1 tablet (4 mg total) by mouth every 6 (six) hours as needed for nausea. 12/01/17   Bettey Costa, MD  traMADol Veatrice Bourbon) 50 MG tablet One to Two Tabs By Mouth Every Six Hours As Needed For Pain 11/30/17   Bettey Costa, MD    REVIEW  OF SYSTEMS:  Review of Systems  Constitutional: Negative for chills, fever, malaise/fatigue and weight loss.  HENT: Negative for ear pain, hearing loss and tinnitus.   Eyes: Negative for blurred vision, double vision, pain and redness.  Respiratory: Negative for cough, hemoptysis and shortness of breath.   Cardiovascular: Negative for chest pain, palpitations, orthopnea and leg swelling.  Gastrointestinal: Positive for nausea and vomiting. Negative for abdominal pain, constipation and diarrhea.  Genitourinary: Negative for dysuria, frequency and hematuria.  Musculoskeletal: Negative for back pain, joint pain and neck pain.  Skin:       No acne, rash, or lesions  Neurological: Negative for dizziness, tremors, focal weakness and weakness.  Endo/Heme/Allergies: Negative for polydipsia. Does not bruise/bleed easily.  Psychiatric/Behavioral: Negative for depression. The patient is not nervous/anxious and does not have insomnia.      VITAL SIGNS:   Vitals:   12/18/17 1916 12/18/17 2034 12/18/17 2200  BP: (!) 197/84 (!) 193/88 (!) 196/87  Pulse: 86 85 82  Resp: (!) 22 14 (!) 7  Temp: 98.3 F (36.8 C)    TempSrc: Oral    SpO2: 100% 97% 100%  Weight: 70 kg    Height: 5\' 8"  (1.727 m)     Wt Readings from Last 3 Encounters:  12/18/17 70 kg  12/01/17 70.2 kg  11/23/17 73.9 kg    PHYSICAL EXAMINATION:  Physical Exam  Vitals reviewed. Constitutional: He is oriented to person, place, and time. He appears well-developed and well-nourished. No distress.  HENT:  Head: Normocephalic and atraumatic.  Dry mucous membranes  Eyes: Pupils are equal, round, and reactive to light. Conjunctivae and EOM are normal. No scleral icterus.  Neck: Normal range of motion. Neck supple. No JVD present. No thyromegaly present.  Cardiovascular: Normal rate, regular rhythm and intact distal pulses. Exam reveals no gallop and no friction rub.  No murmur heard. Respiratory: Effort normal and breath sounds  normal. No respiratory distress. He has no wheezes. He has no rales.  GI: Soft. Bowel sounds are normal. He exhibits no distension. There is no tenderness.  Musculoskeletal: Normal range of motion. He exhibits no edema.  No arthritis, no gout  Lymphadenopathy:    He has no cervical adenopathy.  Neurological: He is alert and oriented to person, place, and time. No cranial nerve deficit.  No dysarthria, no aphasia  Skin: Skin is warm and dry. No rash noted. No erythema.  Psychiatric: He has a normal mood and affect. His behavior is normal. Judgment and thought content normal.    LABORATORY PANEL:   CBC Recent Labs  Lab 12/18/17 1904  WBC 7.8  HGB 11.0*  HCT 33.4*  PLT 217   ------------------------------------------------------------------------------------------------------------------  Chemistries  Recent Labs  Lab 12/18/17 1956  NA 136  K 3.5  CL 96*  CO2 29  GLUCOSE 91  BUN 11  CREATININE 5.74*  CALCIUM 9.7  AST 24  ALT 6  ALKPHOS 55  BILITOT 0.7   ------------------------------------------------------------------------------------------------------------------  Cardiac Enzymes Recent Labs  Lab 12/18/17 1956  TROPONINI 0.04*   ------------------------------------------------------------------------------------------------------------------  RADIOLOGY:  Ct Angio Abd/pel W And/or Wo Contrast  Result Date: 12/18/2017 CLINICAL DATA:  67 year old with end-stage renal disease on hemodialysis with 6 week history of abdominal pain that acutely worsened after dialysis today. Unexplained 30 pound weight loss over the past 1 month. EXAM: CTA ABDOMEN AND PELVIS WITH CONTRAST TECHNIQUE: Multidetector CT imaging of the abdomen and pelvis was performed using the standard protocol during bolus administration of intravenous contrast. Multiplanar reconstructed images and MIPs were obtained and reviewed to evaluate the vascular anatomy. CONTRAST:  116mL OMNIPAQUE IOHEXOL 350  MG/ML IV. COMPARISON:  None. FINDINGS: VASCULAR Aorta: Severe atherosclerosis with calcified and noncalcified plaque. No evidence of aneurysm. Celiac: Atherosclerosis at its origin without evidence of hemodynamically significant stenosis. SMA: Atherosclerosis at its origin and in its descending portion without evidence of significant stenosis. Renals: 2 RIGHT renal arteries with possible hemodynamically significant stenosis at the origin of the RIGHT main renal artery very small accessory UPPER pole renal artery which arises from the aorta just proximal to the main renal artery. Single LEFT renal artery with proximal atherosclerosis though no evidence of hemodynamically significant stenosis. IMA: Patent. Inflow: Severe BILATERAL common iliac artery atherosclerosis without evidence of hemodynamically significant stenosis. Proximal Outflow: Severe BILATERAL common femoral and proximal superficial femoral artery atherosclerosis with possible hemodynamically significant stenoses in common femoral arteries. Veins: Not evaluated. Review of the  MIP images confirms the above findings. NON-VASCULAR Lower chest: Minimal scarring involving the LEFT LOWER LOBE. Visualized lung bases otherwise clear. Heart markedly enlarged with LAD and RIGHT coronary atherosclerosis and mitral annular calcification. Hepatobiliary: Liver normal in size and appearance. Gallbladder normal in appearance without calcified gallstones. No biliary ductal dilation. Pancreas: Normal in appearance without evidence of mass, ductal dilation, or inflammation. Spleen: Normal in size and appearance. Small focus of accessory splenic tissue MEDIAL to the LOWER pole adjacent the pancreatic tail. Adrenals/Urinary Tract: Normal appearing adrenal glands. Both kidneys demonstrate contrast enhancement, though are atrophic and contain numerous cortical cysts. No urinary tract calculi. No hydronephrosis. Urinary bladder unremarkable. Stomach/Bowel: Stomach normal in  appearance for the degree of distention. Normal-appearing small bowel. Sigmoid colon elongated without evidence of diverticulosis. Remainder of the colon normal in appearance. Normal appendix in the RIGHT mid abdomen and RIGHT UPPER pelvis just ANTERIOR to the RIGHT psoas muscle. Lymphatic: No pathologic lymphadenopathy. Reproductive: Moderate prostate gland enlargement, particularly the median lobe. Scattered seminal vesicle calcifications. Other: Large BILATERAL inguinal hernias containing fat. High riding RIGHT testicle versus mass in the RIGHT inguinal canal. Small umbilical hernia containing fat. Musculoskeletal: DISH involving the LOWER thoracic spine. Large Schmorl's nodes involving L1, L2 and L4. Severe facet degenerative changes throughout the lumbar spine. Ankylosis of the sacroiliac joints. No acute findings. IMPRESSION: VASCULAR 1. 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 2. Possible hemodynamically significant stenosis involving the origin of the RIGHT main renal artery. Please note that there is a small accessory RIGHT UPPER pole renal artery which originates from the aorta just above the main renal artery. 3. Severe BILATERAL iliofemoral atherosclerosis with possible hemodynamically significant stenoses involving both common femoral arteries. 4. 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 NON-VASCULAR 1. No acute abnormalities involving the abdomen or pelvis. 2. 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 3. Large BILATERAL inguinal hernias containing fat. High riding RIGHT testicle versus mass in the RIGHT inguinal canal. Please correlate with physical examination as to the location of the RIGHT testicle. Electronically Signed   By: Evangeline Dakin M.D.   On: 12/18/2017 20:55    EKG:   Orders placed or performed during the hospital encounter of 11/19/17  . EKG 12-Lead  .  EKG 12-Lead    IMPRESSION AND PLAN:  Principal Problem:   Intractable nausea and vomiting -unclear etiology, PRN antiemetics, GI consult for further recommendations Active Problems:   ESRD on dialysis [facility]) -nephrology consult for dialysis support, continue home meds   Chronic diastolic CHF (congestive heart failure) ([place]) -continue home medications   HTN (hypertension) -home dose antihypertensives   Atherosclerosis of native arteries of the extremities with ulceration ([place]) -vascular surgery consult the patient states he was scheduled to follow-up with them status post revascularization procedure, and he states that he is still having difficulty ambulating  Chart review performed and case discussed with ED provider. Labs, imaging and/or ECG reviewed by provider and discussed with patient/family. Management plans discussed with the patient and/or family.  DVT PROPHYLAXIS: SubQ heparin  GI PROPHYLAXIS:  None  ADMISSION STATUS: Observation  CODE STATUS: Full Code Status History    Date Active Date Inactive Code Status Order ID Comments User Context   11/27/2017 1831 12/01/2017 2320 Full Code [id number]  Saundra Shelling, MD Inpatient   11/19/2017 0507 11/21/2017 0009 Full Code [id number]  Harrie Foreman, MD Inpatient   11/16/2017 1205 11/16/2017 1717 Full Code [id number]  Dew,  Erskine Squibb, MD Inpatient   01/28/2016 1050 01/28/2016 1456 Full Code [id number]  Algernon Huxley, MD Inpatient   09/28/2015 2306 10/02/2015 1738 Full Code [id number]  Lance Coon, MD Inpatient    Advance Directive Documentation     Most Recent Value  Type of Advance Directive  Healthcare Power of Attorney  Pre-existing out of facility DNR order (yellow form or pink MOST form)  -  "MOST" Form in Place?  -      TOTAL TIME TAKING CARE OF THIS PATIENT: 40 minutes.   Zayli Villafuerte Escalante 12/18/2017, 11:43 PM  [facility]  Office  [phone]  CC: Primary care physician; [name], MD  Note:   This document was prepared using Dragon voice recognition software and may include unintentional dictation errors.

## 2017-12-18 NOTE — ED Notes (Signed)
Patient transported to CT 

## 2017-12-18 NOTE — ED Triage Notes (Signed)
Pt arrived via [facility] from home with c/o abdominal pain. EMS states that pt has had abdominal pain for 6 weeks and had dialysis today that just made him feel worse. EMS states that pt family said that he has lost 30 lbs since July and has not been able to eat. PT given 4mg  zofran enroute.

## 2017-12-19 ENCOUNTER — Observation Stay: Payer: Medicare Other | Admitting: Anesthesiology

## 2017-12-19 ENCOUNTER — Encounter (INDEPENDENT_AMBULATORY_CARE_PROVIDER_SITE_OTHER): Payer: Medicare Other

## 2017-12-19 ENCOUNTER — Encounter
Admission: EM | Disposition: A | Discharge status: Skilled Nursing Facility | Payer: Self-pay | Source: Home / Self Care | Attending: Internal Medicine

## 2017-12-19 ENCOUNTER — Encounter: Payer: Self-pay | Admitting: Anesthesiology

## 2017-12-19 DIAGNOSIS — K2970 Gastritis, unspecified, without bleeding: Secondary | ICD-10-CM

## 2017-12-19 DIAGNOSIS — K3189 Other diseases of stomach and duodenum: Secondary | ICD-10-CM

## 2017-12-19 DIAGNOSIS — R112 Nausea with vomiting, unspecified: Secondary | ICD-10-CM

## 2017-12-19 DIAGNOSIS — K3184 Gastroparesis: Secondary | ICD-10-CM | POA: Diagnosis not present

## 2017-12-19 DIAGNOSIS — E43 Unspecified severe protein-calorie malnutrition: Secondary | ICD-10-CM

## 2017-12-19 HISTORY — PX: ESOPHAGOGASTRODUODENOSCOPY: SHX5428

## 2017-12-19 LAB — BASIC METABOLIC PANEL
ANION GAP: 9 (ref 5–15)
BUN: 12 mg/dL (ref 8–23)
CALCIUM: 9.6 mg/dL (ref 8.9–10.3)
CO2: 31 mmol/L (ref 22–32)
Chloride: 95 mmol/L — ABNORMAL LOW (ref 98–111)
Creatinine, Ser: 6.19 mg/dL — ABNORMAL HIGH (ref 0.61–1.24)
GFR calc Af Amer: 10 mL/min — ABNORMAL LOW (ref >60)
GFR, EST NON AFRICAN AMERICAN: 8 mL/min — AB (ref >60)
Glucose, Bld: 72 mg/dL (ref 70–99)
Potassium: 3.8 mmol/L (ref 3.5–5.1)
Sodium: 135 mmol/L (ref 135–145)

## 2017-12-19 LAB — CBC
HCT: 31.2 % — ABNORMAL LOW (ref 40.0–52.0)
HEMOGLOBIN: 10.4 g/dL — AB (ref 13.0–18.0)
MCH: 32.2 pg (ref 26.0–34.0)
MCHC: 33.2 g/dL (ref 32.0–36.0)
MCV: 97.1 fL (ref 80.0–100.0)
PLATELETS: 208 10*3/uL (ref 150–440)
RBC: 3.22 MIL/uL — AB (ref 4.40–5.90)
RDW: 17.4 % — ABNORMAL HIGH (ref 11.5–14.5)
WBC: 6.9 10*3/uL (ref 3.8–10.6)

## 2017-12-19 LAB — PHOSPHORUS: PHOSPHORUS: 4.6 mg/dL (ref 2.5–4.6)

## 2017-12-19 SURGERY — EGD (ESOPHAGOGASTRODUODENOSCOPY)
Anesthesia: General

## 2017-12-19 MED ORDER — SEVELAMER CARBONATE 800 MG PO TABS
2400.0000 mg | ORAL_TABLET | Freq: Three times a day (TID) | ORAL | Status: DC
  Administered 2017-12-19: 2400 mg via ORAL
  Filled 2017-12-19: qty 3

## 2017-12-19 MED ORDER — CLOPIDOGREL BISULFATE 75 MG PO TABS
75.0000 mg | ORAL_TABLET | Freq: Every day | ORAL | Status: DC
  Administered 2017-12-19 – 2017-12-25 (×7): 75 mg via ORAL
  Filled 2017-12-19 (×8): qty 1

## 2017-12-19 MED ORDER — HYDRALAZINE HCL 25 MG PO TABS
25.0000 mg | ORAL_TABLET | Freq: Every day | ORAL | Status: DC
  Administered 2017-12-19 – 2017-12-25 (×7): 25 mg via ORAL
  Filled 2017-12-19 (×8): qty 1

## 2017-12-19 MED ORDER — GLYCOPYRROLATE 0.2 MG/ML IJ SOLN
INTRAMUSCULAR | Status: DC | PRN
  Administered 2017-12-19: 0.2 mg via INTRAVENOUS

## 2017-12-19 MED ORDER — CALCIUM ACETATE (PHOS BINDER) 667 MG PO CAPS
1334.0000 mg | ORAL_CAPSULE | Freq: Three times a day (TID) | ORAL | Status: DC
  Administered 2017-12-19: 1334 mg via ORAL
  Filled 2017-12-19: qty 2

## 2017-12-19 MED ORDER — CLONAZEPAM 0.125 MG PO TBDP
0.2500 mg | ORAL_TABLET | Freq: Every day | ORAL | Status: DC
  Administered 2017-12-19 – 2017-12-24 (×7): 0.25 mg via ORAL
  Filled 2017-12-19 (×7): qty 2

## 2017-12-19 MED ORDER — IRBESARTAN 150 MG PO TABS
300.0000 mg | ORAL_TABLET | Freq: Every day | ORAL | Status: DC
  Administered 2017-12-19 – 2017-12-25 (×7): 300 mg via ORAL
  Filled 2017-12-19 (×8): qty 2

## 2017-12-19 MED ORDER — METOCLOPRAMIDE HCL 5 MG/ML IJ SOLN
5.0000 mg | Freq: Four times a day (QID) | INTRAMUSCULAR | Status: DC | PRN
Start: 2017-12-19 — End: 2017-12-25

## 2017-12-19 MED ORDER — ONDANSETRON HCL 4 MG/2ML IJ SOLN
4.0000 mg | Freq: Four times a day (QID) | INTRAMUSCULAR | Status: DC | PRN
  Administered 2017-12-19: 4 mg via INTRAVENOUS
  Filled 2017-12-19: qty 2

## 2017-12-19 MED ORDER — FAMOTIDINE IN NACL 20-0.9 MG/50ML-% IV SOLN
20.0000 mg | Freq: Two times a day (BID) | INTRAVENOUS | Status: DC
  Administered 2017-12-19 – 2017-12-20 (×3): 20 mg via INTRAVENOUS
  Filled 2017-12-19 (×3): qty 50

## 2017-12-19 MED ORDER — PROPOFOL 500 MG/50ML IV EMUL
INTRAVENOUS | Status: DC | PRN
  Administered 2017-12-19: 140 ug/kg/min via INTRAVENOUS

## 2017-12-19 MED ORDER — ACETAMINOPHEN 650 MG RE SUPP: 650.0000 mg | Freq: Four times a day (QID) | RECTAL | Status: DC | PRN

## 2017-12-19 MED ORDER — HEPARIN SODIUM (PORCINE) 5000 UNIT/ML IJ SOLN
5000.0000 [IU] | Freq: Three times a day (TID) | INTRAMUSCULAR | Status: DC
  Administered 2017-12-19 – 2017-12-25 (×18): 5000 [IU] via SUBCUTANEOUS
  Filled 2017-12-19 (×18): qty 1

## 2017-12-19 MED ORDER — FUROSEMIDE 40 MG PO TABS
40.0000 mg | ORAL_TABLET | Freq: Every day | ORAL | Status: DC
  Administered 2017-12-19 – 2017-12-25 (×7): 40 mg via ORAL
  Filled 2017-12-19 (×8): qty 1

## 2017-12-19 MED ORDER — MORPHINE SULFATE (PF) 2 MG/ML IV SOLN
2.0000 mg | INTRAVENOUS | Status: DC | PRN
  Administered 2017-12-19 – 2017-12-25 (×7): 2 mg via INTRAVENOUS
  Filled 2017-12-19 (×6): qty 1

## 2017-12-19 MED ORDER — ALLOPURINOL 100 MG PO TABS
100.0000 mg | ORAL_TABLET | Freq: Every day | ORAL | Status: DC
  Administered 2017-12-19 – 2017-12-25 (×7): 100 mg via ORAL
  Filled 2017-12-19 (×8): qty 1

## 2017-12-19 MED ORDER — LIDOCAINE HCL (CARDIAC) PF 100 MG/5ML IV SOSY
PREFILLED_SYRINGE | INTRAVENOUS | Status: DC | PRN
  Administered 2017-12-19: 60 mg via INTRAVENOUS

## 2017-12-19 MED ORDER — ATORVASTATIN CALCIUM 10 MG PO TABS
10.0000 mg | ORAL_TABLET | Freq: Every day | ORAL | Status: DC
  Administered 2017-12-19 – 2017-12-24 (×6): 10 mg via ORAL
  Filled 2017-12-19 (×6): qty 1

## 2017-12-19 MED ORDER — TRAMADOL HCL 50 MG PO TABS
50.0000 mg | ORAL_TABLET | Freq: Two times a day (BID) | ORAL | Status: DC | PRN
  Administered 2017-12-19 – 2017-12-25 (×7): 50 mg via ORAL
  Filled 2017-12-19 (×7): qty 1

## 2017-12-19 MED ORDER — AMLODIPINE BESYLATE 10 MG PO TABS
10.0000 mg | ORAL_TABLET | Freq: Every day | ORAL | Status: DC
  Administered 2017-12-19 – 2017-12-25 (×7): 10 mg via ORAL
  Filled 2017-12-19 (×8): qty 1

## 2017-12-19 MED ORDER — PROMETHAZINE HCL 25 MG/ML IJ SOLN: 12.5000 mg | Freq: Four times a day (QID) | INTRAMUSCULAR | Status: DC | PRN

## 2017-12-19 MED ORDER — SODIUM CHLORIDE 0.9 % IV SOLN
INTRAVENOUS | Status: AC
  Administered 2017-12-19: 02:00:00 via INTRAVENOUS

## 2017-12-19 MED ORDER — ONDANSETRON HCL 4 MG PO TABS
4.0000 mg | ORAL_TABLET | Freq: Four times a day (QID) | ORAL | Status: DC | PRN
  Administered 2017-12-24 – 2017-12-25 (×2): 4 mg via ORAL
  Filled 2017-12-19 (×2): qty 1

## 2017-12-19 MED ORDER — CINACALCET HCL 30 MG PO TABS
90.0000 mg | ORAL_TABLET | Freq: Every day | ORAL | Status: DC
  Administered 2017-12-19: 90 mg via ORAL
  Filled 2017-12-19: qty 3

## 2017-12-19 MED ORDER — PHENYLEPHRINE HCL 10 MG/ML IJ SOLN
INTRAMUSCULAR | Status: DC | PRN
  Administered 2017-12-19 (×3): 200 ug via INTRAVENOUS

## 2017-12-19 MED ORDER — GABAPENTIN 100 MG PO CAPS
200.0000 mg | ORAL_CAPSULE | Freq: Two times a day (BID) | ORAL | Status: DC
  Administered 2017-12-19 – 2017-12-25 (×14): 200 mg via ORAL
  Filled 2017-12-19 (×14): qty 2

## 2017-12-19 MED ORDER — ACETAMINOPHEN 325 MG PO TABS: 650.0000 mg | ORAL_TABLET | Freq: Four times a day (QID) | ORAL | Status: DC | PRN

## 2017-12-19 MED ORDER — PROPOFOL 10 MG/ML IV BOLUS
INTRAVENOUS | Status: DC | PRN
  Administered 2017-12-19: 70 mg via INTRAVENOUS

## 2017-12-19 MED ORDER — SODIUM CHLORIDE 0.9 % IV SOLN
INTRAVENOUS | Status: DC
  Administered 2017-12-19: 12:00:00 via INTRAVENOUS

## 2017-12-19 NOTE — Progress Notes (Signed)
[facility] [place] NAME: Johnny [name]    MR#:  [id number]  DATE OF BIRTH:  26-Feb-1951  SUBJECTIVE:  CHIEF COMPLAINT: Patient is reporting intractable nausea and vomiting for the past 2 weeks.  Reports that he vomits immediately he eats or drinks anything.  Reporting epigastric abdominal pain.  Denies any EGD  in the past, reporting significant weight loss of 30 pounds in the past 2 weeks REVIEW OF SYSTEMS:  CONSTITUTIONAL: No fever, fatigue or weakness.  EYES: No blurred or double vision.  EARS, NOSE, AND THROAT: No tinnitus or ear pain.  RESPIRATORY: No cough, shortness of breath, wheezing or hemoptysis.  CARDIOVASCULAR: No chest pain, orthopnea, edema.  GASTROINTESTINAL: Reporting 2-week history of nausea, vomiting, reporting epigastric abdominal pain, denies hematemesis or diarrhea GENITOURINARY: No dysuria, hematuria.  ENDOCRINE: No polyuria, nocturia,  HEMATOLOGY: No anemia, easy bruising or bleeding SKIN: No rash or lesion. MUSCULOSKELETAL: No joint pain or arthritis.   NEUROLOGIC: No tingling, numbness, weakness.  PSYCHIATRY: No anxiety or depression.   DRUG ALLERGIES:   Allergies  Allergen Reactions  . Shellfish Allergy Anaphylaxis    VITALS:  Blood pressure (!) 159/76, pulse 79, temperature (!) 97.4 F (36.3 C), temperature source Tympanic, resp. rate 20, height 5\' 8"  (1.727 m), weight 70 kg, SpO2 99 %.  PHYSICAL EXAMINATION:  GENERAL:  67 y.o.-year-old patient lying in the bed with no acute distress.  EYES: Pupils equal, round, reactive to light and accommodation. No scleral icterus. Extraocular muscles intact.  HEENT: Head atraumatic, normocephalic. Oropharynx and nasopharynx clear.  NECK:  Supple, no jugular venous distention. No thyroid enlargement, no tenderness.  LUNGS: Normal breath sounds bilaterally, no wheezing, rales,rhonchi or crepitation. No use of accessory muscles of respiration.  CARDIOVASCULAR: S1,  S2 normal. No murmurs, rubs, or gallops.  ABDOMEN: Soft, epigastric tenderness is present, no rebound tenderness, nondistended. Bowel sounds present. EXTREMITIES: No pedal edema, cyanosis, or clubbing.  NEUROLOGIC: Cranial nerves II through XII are intact. Muscle strength 5/5 in all extremities. Sensation intact. Gait not checked.  PSYCHIATRIC: The patient is alert and oriented x 3.  SKIN: No obvious rash, lesion, or ulcer.    LABORATORY PANEL:   CBC Recent Labs  Lab 12/19/17 0219  WBC 6.9  HGB 10.4*  HCT 31.2*  PLT 208   ------------------------------------------------------------------------------------------------------------------  Chemistries  Recent Labs  Lab 12/18/17 1956 12/19/17 0219  NA 136 135  K 3.5 3.8  CL 96* 95*  CO2 29 31  GLUCOSE 91 72  BUN 11 12  CREATININE 5.74* 6.19*  CALCIUM 9.7 9.6  AST 24  --   ALT 6  --   ALKPHOS 55  --   BILITOT 0.7  --    ------------------------------------------------------------------------------------------------------------------  Cardiac Enzymes Recent Labs  Lab 12/18/17 1956  TROPONINI 0.04*   ------------------------------------------------------------------------------------------------------------------  RADIOLOGY:  Ct Angio Abd/pel W And/or Wo Contrast  Result Date: 12/18/2017 CLINICAL DATA:  67 year old with end-stage renal disease on hemodialysis with 6 week history of abdominal pain that acutely worsened after dialysis today. Unexplained 30 pound weight loss over the past 1 month. EXAM: CTA ABDOMEN AND PELVIS WITH CONTRAST TECHNIQUE: Multidetector CT imaging of the abdomen and pelvis was performed using the standard protocol during bolus administration of intravenous contrast. Multiplanar reconstructed images and MIPs were obtained and reviewed to evaluate the vascular anatomy. CONTRAST:  151mL OMNIPAQUE IOHEXOL 350 MG/ML IV. COMPARISON:  None. FINDINGS: VASCULAR Aorta: Severe atherosclerosis with calcified  and noncalcified plaque. No evidence  of aneurysm. Celiac: Atherosclerosis at its origin without evidence of hemodynamically significant stenosis. SMA: Atherosclerosis at its origin and in its descending portion without evidence of significant stenosis. Renals: 2 RIGHT renal arteries with possible hemodynamically significant stenosis at the origin of the RIGHT main renal artery very small accessory UPPER pole renal artery which arises from the aorta just proximal to the main renal artery. Single LEFT renal artery with proximal atherosclerosis though no evidence of hemodynamically significant stenosis. IMA: Patent. Inflow: Severe BILATERAL common iliac artery atherosclerosis without evidence of hemodynamically significant stenosis. Proximal Outflow: Severe BILATERAL common femoral and proximal superficial femoral artery atherosclerosis with possible hemodynamically significant stenoses in common femoral arteries. Veins: Not evaluated. Review of the MIP images confirms the above findings. NON-VASCULAR Lower chest: Minimal scarring involving the LEFT LOWER LOBE. Visualized lung bases otherwise clear. Heart markedly enlarged with LAD and RIGHT coronary atherosclerosis and mitral annular calcification. Hepatobiliary: Liver normal in size and appearance. Gallbladder normal in appearance without calcified gallstones. No biliary ductal dilation. Pancreas: Normal in appearance without evidence of mass, ductal dilation, or inflammation. Spleen: Normal in size and appearance. Small focus of accessory splenic tissue MEDIAL to the LOWER pole adjacent the pancreatic tail. Adrenals/Urinary Tract: Normal appearing adrenal glands. Both kidneys demonstrate contrast enhancement, though are atrophic and contain numerous cortical cysts. No urinary tract calculi. No hydronephrosis. Urinary bladder unremarkable. Stomach/Bowel: Stomach normal in appearance for the degree of distention. Normal-appearing small bowel. Sigmoid colon elongated  without evidence of diverticulosis. Remainder of the colon normal in appearance. Normal appendix in the RIGHT mid abdomen and RIGHT UPPER pelvis just ANTERIOR to the RIGHT psoas muscle. Lymphatic: No pathologic lymphadenopathy. Reproductive: Moderate prostate gland enlargement, particularly the median lobe. Scattered seminal vesicle calcifications. Other: Large BILATERAL inguinal hernias containing fat. High riding RIGHT testicle versus mass in the RIGHT inguinal canal. Small umbilical hernia containing fat. Musculoskeletal: DISH involving the LOWER thoracic spine. Large Schmorl's nodes involving L1, L2 and L4. Severe facet degenerative changes throughout the lumbar spine. Ankylosis of the sacroiliac joints. No acute findings. IMPRESSION: VASCULAR 1. 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 2. Possible hemodynamically significant stenosis involving the origin of the RIGHT main renal artery. Please note that there is a small accessory RIGHT UPPER pole renal artery which originates from the aorta just above the main renal artery. 3. Severe BILATERAL iliofemoral atherosclerosis with possible hemodynamically significant stenoses involving both common femoral arteries. 4. 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 NON-VASCULAR 1. No acute abnormalities involving the abdomen or pelvis. 2. 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 3. Large BILATERAL inguinal hernias containing fat. High riding RIGHT testicle versus mass in the RIGHT inguinal canal. Please correlate with physical examination as to the location of the RIGHT testicle. Electronically Signed   By: Evangeline Dakin M.D.   On: 12/18/2017 20:55    EKG:   Orders placed or performed during the hospital encounter of 11/19/17  . EKG 12-Lead  . EKG 12-Lead    ASSESSMENT AND PLAN:     #Intractable nausea and vomiting with  significant unintentional weight loss N.p.o.,, IV fluids Pepcid IV Seen by GI patient is getting EGD today  #Severe protein calorie malnutrition secondary to acute illness of intractable nausea and vomiting for 2 weeks Currently patient is n.p.o. Consult dietitian  #End-stage renal disease on hemodialysis continue hemodialysis per nephrology Dr. Candiss Norse is following  #Chronic diastolic congestive heart failure not volume overloaded at this  time Currently patient is n.p.o., will resume home medications once patient tolerates diet  #Chronic peripheral vascular disease with venous ulcers Patient is reporting difficulty with ambulation.   Vascular surgery consult placed in regards to the follow-up of revascularization procedure   All the records are reviewed and case discussed with Care Management/Social Workerr. Management plans discussed with the patient, family and they are in agreement.  CODE STATUS:   TOTAL TIME TAKING CARE OF THIS PATIENT: 36  minutes.   POSSIBLE D/C IN 2  DAYS, DEPENDING ON CLINICAL CONDITION.  Note: This dictation was prepared with Dragon dictation along with smaller phrase technology. Any transcriptional errors that result from this process are unintentional.   Nicholes Mango M.D on 12/19/2017 at 3:00 PM  Between 7am to 6pm - Pager - [phone] After 6pm go to [email or web address] - password [email or web address] [facility]  Office  [phone]  CC: Primary care physician; [name], MD

## 2017-12-19 NOTE — Care Management Obs Status (Signed)
[place] NOTIFICATION   Patient Details  Name: Johnny [name]. MRN: [id number] Date of Birth: January 19, 1951   Medicare Observation Status Notification Given:  No(Patient off the floor )    Beverly Sessions, RN 12/19/2017, 3:37 PM

## 2017-12-19 NOTE — Anesthesia Preprocedure Evaluation (Signed)
Anesthesia Evaluation  Patient identified by MRN, date of birth, ID band Patient awake    Reviewed: Allergy & Precautions, NPO status , Patient's Chart, lab work & pertinent test results, reviewed documented beta blocker date and time   Airway Mallampati: II  TM Distance: >3 FB     Dental  (+) Chipped   Pulmonary former smoker,           Cardiovascular hypertension, Pt. on medications + Peripheral Vascular Disease and +CHF       Neuro/Psych    GI/Hepatic   Endo/Other    Renal/GU ESRFRenal disease     Musculoskeletal   Abdominal   Peds  Hematology  (+) anemia ,   Anesthesia Other Findings Gout. EKG ok. Hb 10.4.  Reproductive/Obstetrics                             Anesthesia Physical Anesthesia Plan  ASA: III  Anesthesia Plan: General   Post-op Pain Management:    Induction: Intravenous  PONV Risk Score and Plan:   Airway Management Planned:   Additional Equipment:   Intra-op Plan:   Post-operative Plan:   Informed Consent: I have reviewed the patients History and Physical, chart, labs and discussed the procedure including the risks, benefits and alternatives for the proposed anesthesia with the patient or authorized representative who has indicated his/her understanding and acceptance.     Plan Discussed with: CRNA  Anesthesia Plan Comments:         Anesthesia Quick Evaluation

## 2017-12-19 NOTE — Progress Notes (Signed)
Patient with is well-known to the vascular service.  Patient was most recently seen by me in the office on December 07, 2017 for follow-up.  He has had successful revascularization and maintains a 2+ palpable left posterior tibial pulse.  His difficulty walking and the pain in his foot remains ill-defined and very hard to explain from a vascular standpoint.  Podiatry has also been involved as this appears to be a musculoskeletal issue.  Recent MRI of his foot does not show any pathology except some mild edema  No further vascular interventions at this time.  Full consult to follow

## 2017-12-19 NOTE — Anesthesia Postprocedure Evaluation (Signed)
Anesthesia Post Note  Patient: Johnny [name].  Procedure(s) Performed: ESOPHAGOGASTRODUODENOSCOPY (EGD) (N/A )  Patient location during evaluation: Endoscopy Anesthesia Type: General Level of consciousness: awake and alert Pain management: pain level controlled Vital Signs Assessment: post-procedure vital signs reviewed and stable Respiratory status: spontaneous breathing, nonlabored ventilation, respiratory function stable and patient connected to nasal cannula oxygen Cardiovascular status: blood pressure returned to baseline and stable Postop Assessment: no apparent nausea or vomiting Anesthetic complications: no     Last Vitals:  Vitals:   12/19/17 1544 12/19/17 1915  BP: 137/68 (!) 101/58  Pulse: 96 79  Resp: 17 20  Temp:  36.5 C  SpO2: 100% 97%    Last Pain:  Vitals:   12/19/17 1915  TempSrc: Oral  PainSc:                  [name] S

## 2017-12-19 NOTE — Op Note (Signed)
[facility] Gastroenterology Patient Name: Johnny [name] Procedure Date: 12/19/2017 3:03 PM MRN: [id number] Account #: [id number] Date of Birth: January 29, 1951 Admit Type: Outpatient Age: 67 Room: [facility] ENDO ROOM 1 Gender: Male Note Status: Finalized Procedure:            Upper GI endoscopy Indications:          Upper abdominal pain, Nausea with vomiting Providers:            Lin Landsman MD, MD Medicines:            Monitored Anesthesia Care Complications:        No immediate complications. Estimated blood loss: None. Procedure:            Pre-Anesthesia Assessment:                       - Prior to the procedure, a History and Physical was                        performed, and patient medications and allergies were                        reviewed. The patient is competent. The risks and                        benefits of the procedure and the sedation options and                        risks were discussed with the patient. All questions                        were answered and informed consent was obtained.                        Patient identification and proposed procedure were                        verified by the physician, the nurse, the                        anesthesiologist, the anesthetist and the technician in                        the pre-procedure area in the procedure room in the                        endoscopy suite. Mental Status Examination: alert and                        oriented. Airway Examination: normal oropharyngeal                        airway and neck mobility. Respiratory Examination:                        clear to auscultation. CV Examination: normal.                        Prophylactic Antibiotics: The patient does not require  prophylactic antibiotics. Prior Anticoagulants: The                        patient has taken Plavix (clopidogrel), last dose was                        day of procedure. ASA Grade  Assessment: III - A patient                        with severe systemic disease. After reviewing the risks                        and benefits, the patient was deemed in satisfactory                        condition to undergo the procedure. The anesthesia plan                        was to use monitored anesthesia care (MAC). Immediately                        prior to administration of medications, the patient was                        re-assessed for adequacy to receive sedatives. The                        heart rate, respiratory rate, oxygen saturations, blood                        pressure, adequacy of pulmonary ventilation, and                        response to care were monitored throughout the                        procedure. The physical status of the patient was                        re-assessed after the procedure.                       After obtaining informed consent, the endoscope was                        passed under direct vision. Throughout the procedure,                        the patient's blood pressure, pulse, and oxygen                        saturations were monitored continuously. The Endoscope                        was introduced through the mouth, and advanced to the                        second part of duodenum. The upper GI endoscopy was  accomplished without difficulty. The patient tolerated                        the procedure well. Findings:      The gastroesophageal junction and examined esophagus were normal.      Suspect gastroparesis due to retained gastric contents, moderate amounts       of fluid with pills.      Scattered mild inflammation was found in the gastric fundus, in the       gastric body and in the gastric antrum. Biopsies were taken with a cold       forceps for Helicobacter pylori testing. Likely pill induced gastritis      The duodenal bulb and second portion of the duodenum were normal.      A single  umbilicated lesion measuring 6 mm in diameter was found on the       greater curvature of the gastric antrum c/w pancreatic rest. Impression:           - Normal gastroesophageal junction and esophagus.                       - Gastroparesis, secondary to diabetes mellitus type II.                       - Gastritis. Biopsied.                       - Normal duodenal bulb and second portion of the                        duodenum.                       - A single lesion consistent with aberrant pancreas was                        found in the stomach. Recommendation:       - Await pathology results.                       - Return patient to hospital ward for ongoing care.                       - Diabetic (ADA) diet, low sodium diet and low residue                        diet.                       - Continue present medications.                       - Treat constipation                       - Trial of reglan                       - Return to my office in 4 weeks. 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 All rights reserved. The codes documented in this report are preliminary and upon coder review may  be revised to meet current compliance requirements. Dr. Ulyess Mort Lin Landsman MD, MD 12/19/2017 3:25:25 PM This report has been  signed electronically. Number of Addenda: 0 Note Initiated On: 12/19/2017 3:03 PM      [facility]

## 2017-12-19 NOTE — Progress Notes (Signed)
[facility], [place] 12/19/17  Subjective:   Patient known to our practice from outpatient dialysis This time he is admitted for ongoing nausea and vomiting CT scan of the abdomen done yesterday shows severe vascular disease but no hemodynamically significant stenosis  Patient continues to report nausea and vomiting this morning  Objective:  Vital signs in last 24 hours:  Temp:  [97.5 F (36.4 C)-98.3 F (36.8 C)] 98.2 F (36.8 C) (08/13 1016) Pulse Rate:  [62-86] 82 (08/13 1016) Resp:  [7-22] 16 (08/13 1016) BP: (138-197)/(69-88) 162/84 (08/13 1016) SpO2:  [97 %-100 %] 98 % (08/13 1016) Weight:  [70 kg] 70 kg (08/12 1916)  Weight change:  Filed Weights   12/18/17 1916  Weight: 70 kg    Intake/Output:    Intake/Output Summary (Last 24 hours) at 12/19/2017 1412 Last data filed at 12/19/2017 0551 Gross per 24 hour  Intake 292 ml  Output 0 ml  Net 292 ml     Physical Exam: General:  No acute distress, laying in the bed  HEENT  anicteric, moist oral mucous membranes  Neck  supple  Pulm/lungs  normal breathing effort, clear to auscultation, nasal cannula oxygen  CVS/Heart  regular, no rub  Abdomen:   Soft, mild midepigastric tenderness  Extremities:  Left leg bandaged, right trace edema  Neurologic:  Alert, oriented  Access:  AV fistula       Basic Metabolic Panel:  Recent Labs  Lab 12/18/17 1956 12/19/17 0219  NA 136 135  K 3.5 3.8  CL 96* 95*  CO2 29 31  GLUCOSE 91 72  BUN 11 12  CREATININE 5.74* 6.19*  CALCIUM 9.7 9.6  PHOS  --  4.6     CBC: Recent Labs  Lab 12/18/17 1904 12/19/17 0219  WBC 7.8 6.9  NEUTROABS 5.9  --   HGB 11.0* 10.4*  HCT 33.4* 31.2*  MCV 97.1 97.1  PLT 217 208      Lab Results  Component Value Date   HEPBSAG Negative 09/30/2015      Microbiology:  No results found for this or any previous visit (from the past 240 hour(s)).  Coagulation Studies: No results for input(s): LABPROT,  INR in the last 72 hours.  Urinalysis: No results for input(s): COLORURINE, LABSPEC, PHURINE, GLUCOSEU, HGBUR, BILIRUBINUR, KETONESUR, PROTEINUR, UROBILINOGEN, NITRITE, LEUKOCYTESUR in the last 72 hours.  Invalid input(s): APPERANCEUR    Imaging: Ct Angio Abd/pel W And/or Wo Contrast  Result Date: 12/18/2017 CLINICAL DATA:  67 year old with end-stage renal disease on hemodialysis with 6 week history of abdominal pain that acutely worsened after dialysis today. Unexplained 30 pound weight loss over the past 1 month. EXAM: CTA ABDOMEN AND PELVIS WITH CONTRAST TECHNIQUE: Multidetector CT imaging of the abdomen and pelvis was performed using the standard protocol during bolus administration of intravenous contrast. Multiplanar reconstructed images and MIPs were obtained and reviewed to evaluate the vascular anatomy. CONTRAST:  190mL OMNIPAQUE IOHEXOL 350 MG/ML IV. COMPARISON:  None. FINDINGS: VASCULAR Aorta: Severe atherosclerosis with calcified and noncalcified plaque. No evidence of aneurysm. Celiac: Atherosclerosis at its origin without evidence of hemodynamically significant stenosis. SMA: Atherosclerosis at its origin and in its descending portion without evidence of significant stenosis. Renals: 2 RIGHT renal arteries with possible hemodynamically significant stenosis at the origin of the RIGHT main renal artery very small accessory UPPER pole renal artery which arises from the aorta just proximal to the main renal artery. Single LEFT renal artery with proximal atherosclerosis though no evidence  of hemodynamically significant stenosis. IMA: Patent. Inflow: Severe BILATERAL common iliac artery atherosclerosis without evidence of hemodynamically significant stenosis. Proximal Outflow: Severe BILATERAL common femoral and proximal superficial femoral artery atherosclerosis with possible hemodynamically significant stenoses in common femoral arteries. Veins: Not evaluated. Review of the MIP images confirms  the above findings. NON-VASCULAR Lower chest: Minimal scarring involving the LEFT LOWER LOBE. Visualized lung bases otherwise clear. Heart markedly enlarged with LAD and RIGHT coronary atherosclerosis and mitral annular calcification. Hepatobiliary: Liver normal in size and appearance. Gallbladder normal in appearance without calcified gallstones. No biliary ductal dilation. Pancreas: Normal in appearance without evidence of mass, ductal dilation, or inflammation. Spleen: Normal in size and appearance. Small focus of accessory splenic tissue MEDIAL to the LOWER pole adjacent the pancreatic tail. Adrenals/Urinary Tract: Normal appearing adrenal glands. Both kidneys demonstrate contrast enhancement, though are atrophic and contain numerous cortical cysts. No urinary tract calculi. No hydronephrosis. Urinary bladder unremarkable. Stomach/Bowel: Stomach normal in appearance for the degree of distention. Normal-appearing small bowel. Sigmoid colon elongated without evidence of diverticulosis. Remainder of the colon normal in appearance. Normal appendix in the RIGHT mid abdomen and RIGHT UPPER pelvis just ANTERIOR to the RIGHT psoas muscle. Lymphatic: No pathologic lymphadenopathy. Reproductive: Moderate prostate gland enlargement, particularly the median lobe. Scattered seminal vesicle calcifications. Other: Large BILATERAL inguinal hernias containing fat. High riding RIGHT testicle versus mass in the RIGHT inguinal canal. Small umbilical hernia containing fat. Musculoskeletal: DISH involving the LOWER thoracic spine. Large Schmorl's nodes involving L1, L2 and L4. Severe facet degenerative changes throughout the lumbar spine. Ankylosis of the sacroiliac joints. No acute findings. IMPRESSION: VASCULAR 1. 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 2. Possible hemodynamically significant stenosis involving the origin of the RIGHT main renal artery. Please note that there is a small  accessory RIGHT UPPER pole renal artery which originates from the aorta just above the main renal artery. 3. Severe BILATERAL iliofemoral atherosclerosis with possible hemodynamically significant stenoses involving both common femoral arteries. 4. 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 NON-VASCULAR 1. No acute abnormalities involving the abdomen or pelvis. 2. 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 3. Large BILATERAL inguinal hernias containing fat. High riding RIGHT testicle versus mass in the RIGHT inguinal canal. Please correlate with physical examination as to the location of the RIGHT testicle. Electronically Signed   By: Evangeline Dakin M.D.   On: 12/18/2017 20:55     Medications:   . sodium chloride 100 mL/hr at 12/19/17 1217  . famotidine (PEPCID) IV 20 mg (12/19/17 1225)   . allopurinol  100 mg Oral Daily  . amLODipine  10 mg Oral Daily  . atorvastatin  10 mg Oral Daily  . calcium acetate  1,334 mg Oral TID WC  . cinacalcet  90 mg Oral Q breakfast  . clonazepam  0.25 mg Oral QHS  . clopidogrel  75 mg Oral Daily  . furosemide  40 mg Oral Daily  . gabapentin  200 mg Oral BID  . heparin  5,000 Units Subcutaneous Q8H  . hydrALAZINE  25 mg Oral Daily  . irbesartan  300 mg Oral Daily  . sevelamer carbonate  2,400 mg Oral TID WC   acetaminophen **OR** acetaminophen, morphine injection, ondansetron **OR** ondansetron (ZOFRAN) IV, promethazine, traMADol  Assessment/ Plan:  67 y.o. African-American male ESRD, cardiomyopathy, hypertension, anemia of ckd, shpth, e. coli bacteremia 9/12, left hand inflammatory arthritis resolved, angioplasty cephalic vein 7/90, access declot 9/15. colonoscopy  02/2015, admission for chest pain 11/18/17.  [facility]/ [facility]/MWF/LUE AVG/EDW kg  1.  End-stage renal disease 2.  Nausea and vomiting  3.  Secondary hyperparathyroidism 4.  Anemia chronic kidney  disease 5.  Peripheral vascular disease  Plan: Hemodialysis tomorrow Trial of Nepro Hold sevelamer, calcium acetate and cinacalcet until nausea and vomiting is resolved epo with HD    LOS: 0 Glenisha Gundry 8/13/20192:12 PM  [place], [place]  Note: This note was prepared with Dragon dictation. Any transcription errors are unintentional

## 2017-12-19 NOTE — Progress Notes (Addendum)
Initial Nutrition Assessment  DOCUMENTATION CODES:   Severe malnutrition in context of acute illness/injury  INTERVENTION:   RD will order supplements and vitamins when diet advanced  Recommend Ocuvite daily for wound healing (provides zinc, vitamin A, vitamin C, Vitamin E, copper, and selenium)  Recommend Rena-vite daily  Recommend Ensure Enlive po BID, each supplement provides 350 kcal and 20 grams of protein  Recommend GI soft diet with advancement   NUTRITION DIAGNOSIS:   Severe Malnutrition related to acute illness as evidenced by 12 percent weight loss in 1 month, energy intake < or equal to 50% for > or equal to 5 days.  GOAL:   Patient will meet greater than or equal to 90% of their needs  MONITOR:   Labs, Weight trends, Skin, I & O's, Diet advancement  REASON FOR ASSESSMENT:   Malnutrition Screening Tool    ASSESSMENT:   67 y.o. African-American male with end-stage renal disease on hemodialysis, peripheral artery disease, with femoral stent placement, on Plavix admitted with unintentional weight loss, severe constipation and intractable nausea and vomiting.  Met with pt in room today. RD familiar with this pt from previous admit ~3 weeks ago. Pt eating about 30% of meals prior to last discharge on 7/26. Pt reports that his appetite has just continued to decline and he has developed severe abdominal pain, nausea and vomiting; pt reports he is unable to keep anything down. Pt looks more muscle wasted and weak since last RD visit. Per chart, pt has lost 21lbs(12%) in one month; this is severe weight loss. Pt reports he has gotten so weak, he has to use a walker to get around. Pt has developed severe muscle wasting of his BLE. Pt currently NPO for EGD today. Pt is willing to drink vanilla supplements when diet advanced. RD will order supplements and vitamins once diet advanced to help pt meet his estimated needs and replace losses from HD. Will add ocuvite for wound  healing; pt with non healing wound on his L foot that he reports gets checked once per week. Pt reports intermittent diarrhea pta and only has a BM one time per week that he reports is hard. Recommend GI soft diet once advanced.    Medications reviewed and include: allopurinol, plavix, lasix, heparin, NaCl @100ml/hr, pepcid  Labs reviewed: K 3.8 wnl, P 4.6 wnl Hgb 10.4(L), Hct 31.2(L)  NUTRITION - FOCUSED PHYSICAL EXAM:    Most Recent Value  Orbital Region  No depletion  Upper Arm Region  No depletion  Thoracic and Lumbar Region  No depletion  Buccal Region  No depletion  Temple Region  Mild depletion  Clavicle Bone Region  Mild depletion  Clavicle and Acromion Bone Region  Mild depletion  Scapular Bone Region  No depletion  Dorsal Hand  No depletion  Patellar Region  Severe depletion  Anterior Thigh Region  Severe depletion  Posterior Calf Region  Severe depletion  Edema (RD Assessment)  None  Hair  Reviewed  Eyes  Reviewed  Mouth  Reviewed  Skin  Reviewed  Nails  Reviewed     Diet Order:   Diet Order            Diet NPO time specified  Diet effective now             EDUCATION NEEDS:   Education needs have been addressed  Skin:  Skin Assessment: Reviewed RN Assessment(non healing wound L foot )  Last BM:  pta  Height:   Ht Readings   from Last 1 Encounters:  12/19/17 5' 8" (1.727 m)    Weight:   Wt Readings from Last 1 Encounters:  12/19/17 70 kg    Ideal Body Weight:  70 kg  BMI:  Body mass index is 23.46 kg/m.  Estimated Nutritional Needs:   Kcal:  1900-2200kcal/day   Protein:  95-110g/day   Fluid:  UOP + 1 L    MS, RD, LDN Pager #- [phone] Office#- [phone] After Hours Pager: [phone]  

## 2017-12-19 NOTE — Transfer of Care (Signed)
Immediate Anesthesia Transfer of Care Note  Patient: Johnny [name].  Procedure(s) Performed: ESOPHAGOGASTRODUODENOSCOPY (EGD) (N/A )  Patient Location: PACU  Anesthesia Type:General  Level of Consciousness: sedated  Airway & Oxygen Therapy: Patient Spontanous Breathing and Patient connected to nasal cannula oxygen  Post-op Assessment: Report given to RN and Post -op Vital signs reviewed and stable  Post vital signs: Reviewed and stable  Last Vitals:  Vitals Value Taken Time  BP 95/54 12/19/2017  3:27 PM  Temp 36.2 C 12/19/2017  3:27 PM  Pulse 87 12/19/2017  3:27 PM  Resp 12 12/19/2017  3:27 PM  SpO2 100 % 12/19/2017  3:27 PM    Last Pain:  Vitals:   12/19/17 1524  TempSrc:   PainSc: 0-No pain      Patients Stated Pain Goal: 0 (09/92/78 0044)  Complications: No apparent anesthesia complications

## 2017-12-19 NOTE — Anesthesia Post-op Follow-up Note (Signed)
Anesthesia QCDR form completed.        

## 2017-12-19 NOTE — Care Management (Signed)
Amanda Morris dialysis liaison notified of admission.    

## 2017-12-19 NOTE — OR Nursing (Signed)
Johnny [name] with transportation here to take pt back to his room.

## 2017-12-19 NOTE — Consult Note (Addendum)
Cephas Darby, MD [address]  [place]  [place], [place] [address]  Main: [phone]  Fax: [phone] Pager: [phone]   Consultation  Referring Provider:     No ref. provider found Primary Care Physician:  [name], MD Primary Gastroenterologist:  Dr. Sherri Sear         Reason for Consultation:     Unintentional weight loss, intractable nausea and vomiting  Date of Admission:  12/18/2017 Date of Consultation:  12/19/2017         HPI:   Johnny Kagel. is a 67 y.o. African-American male history of severe peripheral artery disease status post femoral stent placement, on Plavix, end-stage renal disease on hemodialysis who presented from outpatient hemodialysis yesterday due to worsening upper abdominal pain. Reported history of nausea and vomiting and 30 point weight loss July 2019. Patient underwent a CT angiogram protocol yesterday and there was no evidence of significant vascular stenosis to suspect ischemia. He does have diffuse atherosclerosis. Patient reports that his bowel movements are infrequent, goes about once a week and generally stools are hard. Patient has chronic normocytic anemia, no evidence of chronic liver disease. His electrolytes were normal on admission. There is no clinical suspicion for infection. Therefore, GI is consulted for further evaluation   NSAIDs: None  Antiplts/Anticoagulants/Anti thrombotics: Plavix 75 mg daily  GI Procedures: Colonoscopy in 02/2015 at [facility] - Four 6 to 8 mm polyps in the transverse colon and in the   ascending colon. Resected and retrieved.  - One 4 mm polyp in the ascending colon. Resected and   retrieved.  - The distal rectum and anal verge are normal on   retroflexion view.  A: Colon, random, biopsy  - One fragment of adenomatous polyp with high-grade dysplasia (36m) - Multiple fragments of adenomatous polyp  without high-grade dysplasia  [facility] not available Diagnosis: A: Colon, ascending, biopsy - Adenomatous polyp (2 fragments) - Lymphoid nodule (3 fragments)  B: Colon, transverse, biopsy - Adenomatous polyp (2 fragments)  Past Medical History:  Diagnosis Date  . Anemia   . CHF (congestive heart failure) ([place]   . Chronic kidney disease   . Gout   . Hyperlipidemia   . Hypertension     Past Surgical History:  Procedure Laterality Date  . A/V SHUNTOGRAM Left 06/21/2017   Procedure: A/V SHUNTOGRAM;  Surgeon: SKatha Cabal MD;  [place] [facility];  Service: Cardiovascular;  Laterality: Left;  . AV FISTULA PLACEMENT Left 09/18/2015   Procedure: INSERTION OF ARTERIOVENOUS (AV) GORE-TEX GRAFT ARM ( BRACH/AXILLARY GRAFT W/ INSTANT STICK GRAFT );  Surgeon: GKatha Cabal MD;  Location: [facility];  Service: Vascular;  Laterality: Left;  . DIALYSIS FISTULA CREATION    . LOWER EXTREMITY ANGIOGRAPHY Left 11/16/2017   Procedure: LOWER EXTREMITY ANGIOGRAPHY;  Surgeon: DAlgernon Huxley MD;  [facility];  Service: Cardiovascular;  Laterality: Left;  . PERIPHERAL VASCULAR CATHETERIZATION Left 09/01/2015   Procedure: A/V Shuntogram/Fistulagram;  Surgeon: GKatha Cabal MD;  [facility];  Service: Cardiovascular;  Laterality: Left;  . PERIPHERAL VASCULAR CATHETERIZATION N/A 09/30/2015   Procedure: A/V Shuntogram/Fistulagram with perm cathether removal;  Surgeon: JAlgernon Huxley MD;  [facility];  Service: Cardiovascular;  Laterality: N/A;  . PERIPHERAL VASCULAR CATHETERIZATION Left 09/30/2015   Procedure: A/V Shunt Intervention;  Surgeon: JAlgernon Huxley MD;  [facility];  Service: Cardiovascular;  Laterality: Left;  . PERIPHERAL VASCULAR CATHETERIZATION Left 12/03/2015   Procedure: Thrombectomy;  Surgeon: Algernon Huxley, MD;  Location: [place] [facility];  Service: Cardiovascular;   Laterality: Left;  . PERIPHERAL VASCULAR CATHETERIZATION Left 01/28/2016   Procedure: Thrombectomy;  Surgeon: Algernon Huxley, MD;  Location: [place] [facility];  Service: Cardiovascular;  Laterality: Left;  . PERIPHERAL VASCULAR CATHETERIZATION N/A 01/28/2016   Procedure: A/V Shuntogram/Fistulagram;  Surgeon: Algernon Huxley, MD;  Location: [place] [facility];  Service: Cardiovascular;  Laterality: N/A;    Prior to Admission medications   Medication Sig Start Date End Date Taking? Authorizing Provider  allopurinol (ZYLOPRIM) 100 MG tablet Take 100 mg by mouth daily.   Yes [provider]  amLODipine (NORVASC) 10 MG tablet Take 10 mg by mouth daily.    Yes [provider]  atorvastatin (LIPITOR) 10 MG tablet Take 1 tablet by mouth daily. 11/23/17  Yes [provider]  calcium acetate (PHOSLO) 667 MG capsule Take 1,334 mg by mouth 3 (three) times daily with meals.   Yes [provider]  Calcium Gluconate 50 MG TABS Take 50 mg by mouth 3 (three) times daily.  05/19/11  Yes [provider]  cinacalcet (SENSIPAR) 90 MG tablet Take 90 mg by mouth daily.    Yes [provider]  clonazePAM (KLONOPIN) 0.5 MG tablet Take 0.25 mg by mouth at bedtime.    Yes [provider]  clopidogrel (PLAVIX) 75 MG tablet Take 1 tablet (75 mg total) by mouth daily. 12/03/15  Yes Dew, Erskine Squibb, MD  colchicine 0.6 MG tablet Take 0.6 mg by mouth daily as needed (for gout flares).    Yes [provider]  collagenase (SANTYL) ointment Apply topically daily. 11/30/17  Yes Mody, Ulice Bold, MD  furosemide (LASIX) 40 MG tablet Take 40 mg by mouth daily.    Yes [provider]  gabapentin (NEURONTIN) 100 MG capsule Take 200 mg by mouth 2 (two) times daily.   Yes [provider]  hydrALAZINE (APRESOLINE) 25 MG tablet Take 25 mg by mouth daily.    Yes [provider]  irbesartan (AVAPRO) 300 MG tablet Take 300 mg by mouth daily.    Yes [provider]  lidocaine-prilocaine (EMLA) cream Apply 1 application topically as needed (port access).   Yes [provider]  pregabalin (LYRICA) 50 MG capsule Take 50 mg by mouth daily. After each dialysis session   Yes [provider]  sevelamer carbonate (RENVELA) 800 MG tablet Take 2,400 mg by mouth 3 (three) times daily with meals.    Yes [provider]  ondansetron (ZOFRAN ODT) 4 MG disintegrating tablet Take 1 tablet (4 mg total) by mouth every 8 (eight) hours as needed for nausea or vomiting. 12/18/17   Earleen Newport, MD  ondansetron (ZOFRAN) 4 MG tablet Take 1 tablet (4 mg total) by mouth every 6 (six) hours as needed for nausea. 12/01/17   Bettey Costa, MD  traMADol Veatrice Bourbon) 50 MG tablet One to Two Tabs By Mouth Every Six Hours As Needed For Pain 11/30/17   Bettey Costa, MD    Current Facility-Administered Medications:  .  0.9 %  sodium chloride infusion, , Intravenous, Continuous, Gouru, Aruna, MD, Last Rate: 100 mL/hr at 12/19/17 1217 .  acetaminophen (TYLENOL) tablet 650 mg, 650 mg, Oral, Q6H PRN **OR** acetaminophen (TYLENOL) suppository 650 mg, 650 mg, Rectal, Q6H PRN, Lance Coon, MD .  allopurinol (ZYLOPRIM) tablet 100 mg, 100 mg,  Oral, Daily, Lance Coon, MD, 100 mg at 12/19/17 1017 .  amLODipine (NORVASC) tablet 10 mg, 10 mg, Oral, Daily, Lance Coon, MD, 10 mg at 12/19/17 1016 .  atorvastatin (LIPITOR) tablet 10 mg, 10 mg, Oral, Daily, Lance Coon, MD .  clonazePAM Bobbye Charleston) disintegrating tablet 0.25 mg, 0.25 mg, Oral, Corwin Levins, MD, 0.25 mg at 12/19/17 0139 .  clopidogrel (PLAVIX) tablet 75 mg, 75 mg, Oral, Daily, Lance Coon, MD, 75 mg at 12/19/17 1017 .  famotidine (PEPCID) IVPB 20 mg premix, 20 mg, Intravenous, Q12H, Gouru, Aruna, MD, Last Rate: 100 mL/hr at 12/19/17 1225, 20 mg at 12/19/17 1225 .  furosemide (LASIX) tablet 40 mg, 40 mg, Oral, Daily, Lance Coon, MD, 40 mg at 12/19/17 1016 .  gabapentin (NEURONTIN)  capsule 200 mg, 200 mg, Oral, BID, Lance Coon, MD, 200 mg at 12/19/17 1017 .  heparin injection 5,000 Units, 5,000 Units, Subcutaneous, Q8H, Lance Coon, MD, 5,000 Units at 12/19/17 1157 .  hydrALAZINE (APRESOLINE) tablet 25 mg, 25 mg, Oral, Daily, Lance Coon, MD, 25 mg at 12/19/17 1016 .  irbesartan (AVAPRO) tablet 300 mg, 300 mg, Oral, Daily, Lance Coon, MD, 300 mg at 12/19/17 1017 .  morphine 2 MG/ML injection 2 mg, 2 mg, Intravenous, Q4H PRN, Lance Coon, MD, 2 mg at 12/19/17 1217 .  ondansetron (ZOFRAN) tablet 4 mg, 4 mg, Oral, Q6H PRN **OR** ondansetron (ZOFRAN) injection 4 mg, 4 mg, Intravenous, Q6H PRN, Lance Coon, MD, 4 mg at 12/19/17 1025 .  promethazine (PHENERGAN) injection 12.5 mg, 12.5 mg, Intravenous, Q6H PRN, Lance Coon, MD .  traMADol Veatrice Bourbon) tablet 50 mg, 50 mg, Oral, Q12H PRN, Lance Coon, MD  Family History  Problem Relation Age of Onset  . Hypertension Unknown   . Heart disease Unknown      Social History   Tobacco Use  . Smoking status: Former Research scientist (life sciences)  . Smokeless tobacco: Never Used  Substance Use Topics  . Alcohol use: No  . Drug use: No    Allergies as of 12/18/2017 - Review Complete 12/18/2017  Allergen Reaction Noted  . Shellfish allergy Anaphylaxis 09/01/2015    Review of Systems:    All systems reviewed and negative except where noted in HPI.   Physical Exam:  Vital signs in last 24 hours: Temp:  [97.5 F (36.4 C)-98.3 F (36.8 C)] 98.2 F (36.8 C) (08/13 1016) Pulse Rate:  [62-86] 82 (08/13 1016) Resp:  [7-22] 16 (08/13 1016) BP: (138-197)/(69-88) 162/84 (08/13 1016) SpO2:  [97 %-100 %] 98 % (08/13 1016) Weight:  [70 kg] 70 kg (08/12 1916)   General:   Pleasant, cooperative in NAD Head:  Normocephalic and atraumatic. Eyes:   No icterus.   Conjunctiva pink. PERRLA. Ears:  Normal auditory acuity. Neck:  Supple; no masses or thyroidomegaly Lungs: Respirations even and unlabored. Lungs clear to auscultation bilaterally.    No wheezes, crackles, or rhonchi.  Heart:  Regular rate and rhythm;  Without murmur, clicks, rubs or gallops Abdomen:  Soft, mildly distended, dull to percussion, nontender. Normal bowel sounds. No appreciable masses or hepatomegaly.  No rebound or guarding.  Rectal:  Not performed. Msk:  Symmetrical without gross deformities.  Strength bilateral lower extremity weakness  Extremities: Muscle wasting from peripheral artery disease, left lower extremity wrapped in Ace bandage, no edema Neurologic:  Alert and oriented x3;  grossly normal neurologically. Skin:  Intact without significant lesions or rashes. Cervical Nodes:  No significant cervical adenopathy. Psych:  Alert and cooperative. Normal affect.  LAB RESULTS: CBC Latest Ref Rng & Units 12/19/2017 12/18/2017 12/01/2017  WBC 3.8 - 10.6 K/uL 6.9 7.8 12.5(H)  Hemoglobin 13.0 - 18.0 g/dL 10.4(L) 11.0(L) 9.1(L)  Hematocrit 40.0 - 52.0 % 31.2(L) 33.4(L) 27.6(L)  Platelets 150 - 440 K/uL 208 217 285    BMET BMP Latest Ref Rng & Units 12/19/2017 12/18/2017 11/29/2017  Glucose 70 - 99 mg/dL 72 91 83  BUN 8 - 23 mg/dL 12 11 34(H)  Creatinine 0.61 - 1.24 mg/dL 6.19(H) 5.74(H) 10.65(H)  Sodium 135 - 145 mmol/L 135 136 140  Potassium 3.5 - 5.1 mmol/L 3.8 3.5 4.4  Chloride 98 - 111 mmol/L 95(L) 96(L) 97(L)  CO2 22 - 32 mmol/L 31 29 32  Calcium 8.9 - 10.3 mg/dL 9.6 9.7 9.2    LFT Hepatic Function Latest Ref Rng & Units 12/18/2017 11/29/2017 11/27/2017  Total Protein 6.5 - 8.1 g/dL 7.8 - 7.8  Albumin 3.5 - 5.0 g/dL 3.8 2.9(L) 3.5  AST 15 - 41 U/L 24 - 21  ALT 0 - 44 U/L 6 - 5  Alk Phosphatase 38 - 126 U/L 55 - 51  Total Bilirubin 0.3 - 1.2 mg/dL 0.7 - 0.7     STUDIES: Ct Angio Abd/pel W And/or Wo Contrast  Result Date: 12/18/2017 CLINICAL DATA:  67 year old with end-stage renal disease on hemodialysis with 6 week history of abdominal pain that acutely worsened after dialysis today. Unexplained 30 pound weight loss over the past 1 month. EXAM:  CTA ABDOMEN AND PELVIS WITH CONTRAST TECHNIQUE: Multidetector CT imaging of the abdomen and pelvis was performed using the standard protocol during bolus administration of intravenous contrast. Multiplanar reconstructed images and MIPs were obtained and reviewed to evaluate the vascular anatomy. CONTRAST:  158m OMNIPAQUE IOHEXOL 350 MG/ML IV. COMPARISON:  None. FINDINGS: VASCULAR Aorta: Severe atherosclerosis with calcified and noncalcified plaque. No evidence of aneurysm. Celiac: Atherosclerosis at its origin without evidence of hemodynamically significant stenosis. SMA: Atherosclerosis at its origin and in its descending portion without evidence of significant stenosis. Renals: 2 RIGHT renal arteries with possible hemodynamically significant stenosis at the origin of the RIGHT main renal artery very small accessory UPPER pole renal artery which arises from the aorta just proximal to the main renal artery. Single LEFT renal artery with proximal atherosclerosis though no evidence of hemodynamically significant stenosis. IMA: Patent. Inflow: Severe BILATERAL common iliac artery atherosclerosis without evidence of hemodynamically significant stenosis. Proximal Outflow: Severe BILATERAL common femoral and proximal superficial femoral artery atherosclerosis with possible hemodynamically significant stenoses in common femoral arteries. Veins: Not evaluated. Review of the MIP images confirms the above findings. NON-VASCULAR Lower chest: Minimal scarring involving the LEFT LOWER LOBE. Visualized lung bases otherwise clear. Heart markedly enlarged with LAD and RIGHT coronary atherosclerosis and mitral annular calcification. Hepatobiliary: Liver normal in size and appearance. Gallbladder normal in appearance without calcified gallstones. No biliary ductal dilation. Pancreas: Normal in appearance without evidence of mass, ductal dilation, or inflammation. Spleen: Normal in size and appearance. Small focus of accessory  splenic tissue MEDIAL to the LOWER pole adjacent the pancreatic tail. Adrenals/Urinary Tract: Normal appearing adrenal glands. Both kidneys demonstrate contrast enhancement, though are atrophic and contain numerous cortical cysts. No urinary tract calculi. No hydronephrosis. Urinary bladder unremarkable. Stomach/Bowel: Stomach normal in appearance for the degree of distention. Normal-appearing small bowel. Sigmoid colon elongated without evidence of diverticulosis. Remainder of the colon normal in appearance. Normal appendix in the RIGHT mid abdomen and RIGHT UPPER pelvis just ANTERIOR to the RIGHT psoas muscle. Lymphatic:  No pathologic lymphadenopathy. Reproductive: Moderate prostate gland enlargement, particularly the median lobe. Scattered seminal vesicle calcifications. Other: Large BILATERAL inguinal hernias containing fat. High riding RIGHT testicle versus mass in the RIGHT inguinal canal. Small umbilical hernia containing fat. Musculoskeletal: DISH involving the LOWER thoracic spine. Large Schmorl's nodes involving L1, L2 and L4. Severe facet degenerative changes throughout the lumbar spine. Ankylosis of the sacroiliac joints. No acute findings. IMPRESSION: VASCULAR 1. 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 2. Possible hemodynamically significant stenosis involving the origin of the RIGHT main renal artery. Please note that there is a small accessory RIGHT UPPER pole renal artery which originates from the aorta just above the main renal artery. 3. Severe BILATERAL iliofemoral atherosclerosis with possible hemodynamically significant stenoses involving both common femoral arteries. 4. 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 NON-VASCULAR 1. No acute abnormalities involving the abdomen or pelvis. 2. 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 3. Large BILATERAL inguinal hernias  containing fat. High riding RIGHT testicle versus mass in the RIGHT inguinal canal. Please correlate with physical examination as to the location of the RIGHT testicle. Electronically Signed   By: Evangeline Dakin M.D.   On: 12/18/2017 20:55      Impression / Plan:   Johnny Cauthon. is a 67 y.o. African-American male with end-stage renal disease on hemodialysis, peripheral artery disease, with femoral stent placement, on Plavix admitted with unintentional weight loss, severe constipation and intractable nausea and vomiting. Differentials include gastroparesis or H pyloric gastritis or peptic ulcer disease or malignancy or in the setting of severe constipation or  - With history of unintentional weight loss, recommend EGD for further evaluation - If EGD is unremarkable, recommend aggressive bowel regimen and advance diet as tolerated  High-grade dysplasia of the colon polyp: Patient had a colonoscopy in 2016 at [facility] and had random colon biopsies. One of the biopsy fragment had adenomatous polyp with high-grade dysplasia - Therefore, recommend repeat colonoscopy as outpatient. Patient has to be off Plavix for 5 days prior to the procedure  Thank you for involving me in the care of this patient.      LOS: 0 days   Sherri Sear, MD  12/19/2017, 2:25 PM   Note: This dictation was prepared with Dragon dictation along with smaller phrase technology. Any transcriptional errors that result from this process are unintentional.

## 2017-12-19 NOTE — Plan of Care (Signed)
Resting quietly in bed,No complaints voiced at present,will continue planned regimen,callbell within reach.

## 2017-12-20 DIAGNOSIS — M79605 Pain in left leg: Secondary | ICD-10-CM | POA: Diagnosis not present

## 2017-12-20 DIAGNOSIS — R112 Nausea with vomiting, unspecified: Secondary | ICD-10-CM | POA: Diagnosis not present

## 2017-12-20 MED ORDER — RENA-VITE PO TABS
1.0000 | ORAL_TABLET | Freq: Every day | ORAL | Status: DC
  Administered 2017-12-20 – 2017-12-24 (×5): 1 via ORAL
  Filled 2017-12-20 (×5): qty 1

## 2017-12-20 MED ORDER — ENSURE ENLIVE PO LIQD
237.0000 mL | Freq: Two times a day (BID) | ORAL | Status: DC
  Administered 2017-12-20: 237 mL via ORAL

## 2017-12-20 MED ORDER — OCUVITE-LUTEIN PO CAPS
1.0000 | ORAL_CAPSULE | Freq: Every day | ORAL | Status: DC
  Administered 2017-12-20 – 2017-12-25 (×6): 1 via ORAL
  Filled 2017-12-20 (×6): qty 1

## 2017-12-20 MED ORDER — NEPRO/CARBSTEADY PO LIQD
237.0000 mL | Freq: Two times a day (BID) | ORAL | Status: DC
  Administered 2017-12-20 – 2017-12-24 (×7): 237 mL via ORAL

## 2017-12-20 MED ORDER — CHLORHEXIDINE GLUCONATE CLOTH 2 % EX PADS
6.0000 | MEDICATED_PAD | Freq: Every day | CUTANEOUS | Status: DC
  Administered 2017-12-20 – 2017-12-25 (×3): 6 via TOPICAL

## 2017-12-20 MED ORDER — HEPARIN SODIUM (PORCINE) 1000 UNIT/ML DIALYSIS
20.0000 [IU]/kg | INTRAMUSCULAR | Status: DC | PRN
  Filled 2017-12-20: qty 2

## 2017-12-20 MED ORDER — FAMOTIDINE 20 MG PO TABS
20.0000 mg | ORAL_TABLET | Freq: Every day | ORAL | Status: DC
  Administered 2017-12-20 – 2017-12-25 (×6): 20 mg via ORAL
  Filled 2017-12-20 (×7): qty 1

## 2017-12-20 MED ORDER — ACETAMINOPHEN 325 MG PO TABS
650.0000 mg | ORAL_TABLET | Freq: Four times a day (QID) | ORAL | Status: DC
Start: 2017-12-20 — End: 2017-12-25
  Administered 2017-12-20 – 2017-12-25 (×18): 650 mg via ORAL
  Filled 2017-12-20 (×18): qty 2

## 2017-12-20 NOTE — Progress Notes (Signed)
Pre HD assessment    12/20/17 1627  Vital Signs  Temp 98.7 F (37.1 C)  Temp Source Oral  Pulse Rate 88  Pulse Rate Source Monitor  Resp 14  BP (!) 170/68  BP Location Right Arm  BP Method Automatic  Patient Position (if appropriate) Lying  Oxygen Therapy  SpO2 98 %  O2 Device Room Air  Pain Assessment  Pain Scale 0-10  Pain Score 0  Dialysis Weight  Weight 73.2 kg  Type of Weight Pre-Dialysis  Time-Out for Hemodialysis  What Procedure? HD  Pt Identifiers(min of two) First/Last Name;MRN/Account#  Correct Site? Yes  Correct Side? Yes  Correct Procedure? Yes  Consents Verified? Yes  Rad Studies Available? N/A  Safety Precautions Reviewed? Yes  Engineer, civil (consulting) Number  ([id number])  Station Number 1  UF/Alarm Test Passed  Conductivity: Meter 13.8  Conductivity: Machine  13.6  pH 7.6  Reverse Osmosis main  Normal Saline Lot Number '[id number]  Dialyzer Lot Number [id number]  Disposable Set Lot Number [id number]  Machine Temperature 98.6 F (37 C)  Musician and Audible Yes  Blood Lines Intact and Secured Yes  Pre Treatment Patient Checks  Vascular access used during treatment Graft  Hepatitis B Surface Antigen Results Negative  Date Hepatitis B Surface Antigen Drawn 04/26/17  Hepatitis B Surface Antibody  (>10)  Date Hepatitis B Surface Antibody Drawn 04/26/17  Hemodialysis Consent Verified Yes  Hemodialysis Standing Orders Initiated Yes  ECG (Telemetry) Monitor On Yes  Prime Ordered Normal Saline  Length of  DialysisTreatment -hour(s) 3 Hour(s)  Dialyzer Elisio 17H NR  Dialysate 3K, 2.5 Ca  Dialysis Anticoagulant None  Dialysate Flow Ordered 800  Blood Flow Rate Ordered 400 mL/min  Ultrafiltration Goal 0.5 Liters  Dialysis Blood Pressure Support Ordered Normal Saline  Education / Care Plan  Dialysis Education Provided Yes  Documented Education in Care Plan Yes  Fistula / Graft Left Upper arm  No Placement Date or Time found.   Orientation: Left   Access Location: Upper arm  Site Condition No complications  Fistula / Graft Assessment Present;Thrill;Bruit  Drainage Description None

## 2017-12-20 NOTE — Progress Notes (Deleted)
Post HD assessment    12/20/17 1900  Neurological  Level of Consciousness Alert  Orientation Level Oriented to person;Oriented to situation  Respiratory  Respiratory Pattern Regular;Unlabored  Chest Assessment Chest expansion symmetrical  Cardiac  ECG Monitor Yes  Cardiac Rhythm ST  Ectopy Multifocal PVC's  Ectopy Frequency Frequent  Antiarrhythmic device No  Vascular  R Radial Pulse +2  L Radial Pulse +2  Integumentary  Integumentary (WDL) X  Skin Color Appropriate for ethnicity  Musculoskeletal  Musculoskeletal (WDL) X  Generalized Weakness Yes  Assistive Device None  GU Assessment  Genitourinary (WDL) X  Genitourinary Symptoms  (HD)  Psychosocial  Psychosocial (WDL) WDL

## 2017-12-20 NOTE — Progress Notes (Signed)
[facility] NAME: Johnny [name]    MR#:  [id number]  DATE OF BIRTH:  1950-07-11  SUBJECTIVE:  CHIEF COMPLAINT: Patient is tolerating liquid diet fine.  Denies any abdominal pain.  Will advance diet as tolerated REVIEW OF SYSTEMS:  CONSTITUTIONAL: No fever, fatigue or weakness.  EYES: No blurred or double vision.  EARS, NOSE, AND THROAT: No tinnitus or ear pain.  RESPIRATORY: No cough, shortness of breath, wheezing or hemoptysis.  CARDIOVASCULAR: No chest pain, orthopnea, edema.  GASTROINTESTINAL: Reporting 2-week history of nausea, vomiting, reporting epigastric abdominal pain, denies hematemesis or diarrhea GENITOURINARY: No dysuria, hematuria.  ENDOCRINE: No polyuria, nocturia,  HEMATOLOGY: No anemia, easy bruising or bleeding SKIN: No rash or lesion. MUSCULOSKELETAL: No joint pain or arthritis.   NEUROLOGIC: No tingling, numbness, weakness.  PSYCHIATRY: No anxiety or depression.   DRUG ALLERGIES:   Allergies  Allergen Reactions  . Shellfish Allergy Anaphylaxis    VITALS:  Blood pressure (!) 155/62, pulse 72, temperature (!) 97.5 F (36.4 C), temperature source Axillary, resp. rate 17, height 5\' 8"  (1.727 m), weight 70 kg, SpO2 96 %.  PHYSICAL EXAMINATION:  GENERAL:  67 y.o.-year-old patient lying in the bed with no acute distress.  EYES: Pupils equal, round, reactive to light and accommodation. No scleral icterus. Extraocular muscles intact.  HEENT: Head atraumatic, normocephalic. Oropharynx and nasopharynx clear.  NECK:  Supple, no jugular venous distention. No thyroid enlargement, no tenderness.  LUNGS: Normal breath sounds bilaterally, no wheezing, rales,rhonchi or crepitation. No use of accessory muscles of respiration.  CARDIOVASCULAR: S1, S2 normal. No murmurs, rubs, or gallops.  ABDOMEN: Soft, epigastric tenderness is present, no rebound tenderness, nondistended. Bowel sounds present. EXTREMITIES: No pedal  edema, cyanosis, or clubbing.  NEUROLOGIC: Cranial nerves II through XII are intact. Muscle strength 5/5 in all extremities. Sensation intact. Gait not checked.  PSYCHIATRIC: The patient is alert and oriented x 3.  SKIN: No obvious rash, lesion, or ulcer.    LABORATORY PANEL:   CBC Recent Labs  Lab 12/19/17 0219  WBC 6.9  HGB 10.4*  HCT 31.2*  PLT 208   ------------------------------------------------------------------------------------------------------------------  Chemistries  Recent Labs  Lab 12/18/17 1956 12/19/17 0219  NA 136 135  K 3.5 3.8  CL 96* 95*  CO2 29 31  GLUCOSE 91 72  BUN 11 12  CREATININE 5.74* 6.19*  CALCIUM 9.7 9.6  AST 24  --   ALT 6  --   ALKPHOS 55  --   BILITOT 0.7  --    ------------------------------------------------------------------------------------------------------------------  Cardiac Enzymes Recent Labs  Lab 12/18/17 1956  TROPONINI 0.04*   ------------------------------------------------------------------------------------------------------------------  RADIOLOGY:  Ct Angio Abd/pel W And/or Wo Contrast  Result Date: 12/18/2017 CLINICAL DATA:  67 year old with end-stage renal disease on hemodialysis with 6 week history of abdominal pain that acutely worsened after dialysis today. Unexplained 30 pound weight loss over the past 1 month. EXAM: CTA ABDOMEN AND PELVIS WITH CONTRAST TECHNIQUE: Multidetector CT imaging of the abdomen and pelvis was performed using the standard protocol during bolus administration of intravenous contrast. Multiplanar reconstructed images and MIPs were obtained and reviewed to evaluate the vascular anatomy. CONTRAST:  180mL OMNIPAQUE IOHEXOL 350 MG/ML IV. COMPARISON:  None. FINDINGS: VASCULAR Aorta: Severe atherosclerosis with calcified and noncalcified plaque. No evidence of aneurysm. Celiac: Atherosclerosis at its origin without evidence of hemodynamically significant stenosis. SMA: Atherosclerosis at its  origin and in its descending portion without evidence of significant stenosis. Renals: 2 RIGHT  renal arteries with possible hemodynamically significant stenosis at the origin of the RIGHT main renal artery very small accessory UPPER pole renal artery which arises from the aorta just proximal to the main renal artery. Single LEFT renal artery with proximal atherosclerosis though no evidence of hemodynamically significant stenosis. IMA: Patent. Inflow: Severe BILATERAL common iliac artery atherosclerosis without evidence of hemodynamically significant stenosis. Proximal Outflow: Severe BILATERAL common femoral and proximal superficial femoral artery atherosclerosis with possible hemodynamically significant stenoses in common femoral arteries. Veins: Not evaluated. Review of the MIP images confirms the above findings. NON-VASCULAR Lower chest: Minimal scarring involving the LEFT LOWER LOBE. Visualized lung bases otherwise clear. Heart markedly enlarged with LAD and RIGHT coronary atherosclerosis and mitral annular calcification. Hepatobiliary: Liver normal in size and appearance. Gallbladder normal in appearance without calcified gallstones. No biliary ductal dilation. Pancreas: Normal in appearance without evidence of mass, ductal dilation, or inflammation. Spleen: Normal in size and appearance. Small focus of accessory splenic tissue MEDIAL to the LOWER pole adjacent the pancreatic tail. Adrenals/Urinary Tract: Normal appearing adrenal glands. Both kidneys demonstrate contrast enhancement, though are atrophic and contain numerous cortical cysts. No urinary tract calculi. No hydronephrosis. Urinary bladder unremarkable. Stomach/Bowel: Stomach normal in appearance for the degree of distention. Normal-appearing small bowel. Sigmoid colon elongated without evidence of diverticulosis. Remainder of the colon normal in appearance. Normal appendix in the RIGHT mid abdomen and RIGHT UPPER pelvis just ANTERIOR to the RIGHT  psoas muscle. Lymphatic: No pathologic lymphadenopathy. Reproductive: Moderate prostate gland enlargement, particularly the median lobe. Scattered seminal vesicle calcifications. Other: Large BILATERAL inguinal hernias containing fat. High riding RIGHT testicle versus mass in the RIGHT inguinal canal. Small umbilical hernia containing fat. Musculoskeletal: DISH involving the LOWER thoracic spine. Large Schmorl's nodes involving L1, L2 and L4. Severe facet degenerative changes throughout the lumbar spine. Ankylosis of the sacroiliac joints. No acute findings. IMPRESSION: VASCULAR 1. 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 2. Possible hemodynamically significant stenosis involving the origin of the RIGHT main renal artery. Please note that there is a small accessory RIGHT UPPER pole renal artery which originates from the aorta just above the main renal artery. 3. Severe BILATERAL iliofemoral atherosclerosis with possible hemodynamically significant stenoses involving both common femoral arteries. 4. 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 NON-VASCULAR 1. No acute abnormalities involving the abdomen or pelvis. 2. 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 3. Large BILATERAL inguinal hernias containing fat. High riding RIGHT testicle versus mass in the RIGHT inguinal canal. Please correlate with physical examination as to the location of the RIGHT testicle. Electronically Signed   By: Evangeline Dakin M.D.   On: 12/18/2017 20:55    EKG:   Orders placed or performed during the hospital encounter of 11/19/17  . EKG 12-Lead  . EKG 12-Lead    ASSESSMENT AND PLAN:     #Intractable nausea and vomiting with significant unintentional weight loss EGD on 12/19/2017 has revealed gastroparesis and gastritis Nausea vomiting resolved with IV Reglan and Pepcid Tolerating liquid diet will  advance as tolerated IV fluids  #Severe protein calorie malnutrition secondary to acute illness of intractable nausea and vomiting for 2 weeks Seen by dietitian  #End-stage renal disease on hemodialysis continue hemodialysis per nephrology Dr. Candiss Norse is following Today morning patient is confused and dialysis is not done but patient is awake alert and oriented x3 during my examination  #Chronic diastolic congestive heart failure not volume overloaded at this time  resume home medications once patient tolerates diet  #Chronic peripheral vascular disease with venous ulcers Patient is reporting difficulty with ambulation.   Vascular surgery consult placed seen by Dr. Delana Meyer.  Recent MRI looks fine and no surgical interventions at this time.  Patient still feeling cold and cyanotic in his extremities and wants to talk to vascular in regards to his revascularization procedure.  Discussed with Dr. Delana Meyer he will talk to the patient  All the records are reviewed and case discussed with Care Management/Social Workerr. Management plans discussed with the patient, family and they are in agreement.  CODE STATUS:   TOTAL TIME TAKING CARE OF THIS PATIENT: 36  minutes.   POSSIBLE D/C IN 2  DAYS, DEPENDING ON CLINICAL CONDITION.  Note: This dictation was prepared with Dragon dictation along with smaller phrase technology. Any transcriptional errors that result from this process are unintentional.   Nicholes Mango M.D on 12/20/2017 at 2:05 PM  Between 7am to 6pm - Pager - [phone] After 6pm go to [email or web address] - password [email or web address] [facility]  Office  [phone]  CC: Primary care physician; [name], MD

## 2017-12-20 NOTE — Progress Notes (Signed)
[facility]  Daily Progress Note   Subjective  - 1 Day Post-Op  I am asked to evaluate the patient at the patient's insistence.  He continues to have pain in his foot which he describes as pain in the ankle that seems to radiate or include his foot.  He is now been wrapped in an Unna boot by the wound center.  He states that his leg was very swollen when he was home.  Objective Vitals:   12/20/17 0837 12/20/17 0930 12/20/17 0945 12/20/17 1122  BP: (!) 124/58 (!) 143/81 (!) 163/61 (!) 155/62  Pulse: 63 72 79 72  Resp: 20 16 12 17   Temp: (!) 97.4 F (36.3 C) (!) 94.5 F (34.7 C)  (!) 97.5 F (36.4 C)  TempSrc: Oral Axillary  Axillary  SpO2: 100% 100% 94% 96%  Weight:      Height:        Intake/Output Summary (Last 24 hours) at 12/20/2017 1620 Last data filed at 12/20/2017 0914 Gross per 24 hour  Intake 220 ml  Output -  Net 220 ml    PULM  Normal effort , no use of accessory muscles CV  No JVD, RRR Abd      No distended, nontender VASC  the patient maintains a 2+ very strong posterior tibial pulse.  The ankle is wrapped with an Unna boot that extends up to the knee.  There is a small area of drainage noted on the Unna boot over the lateral malleolus where his ulcer is.  His toes are warm with brisk capillary refill his toes and forefoot are also quite wrinkled consistent with someone who was recently very swollen but now after being in the hospital for 2 days has a significant reduction in his edema.  Laboratory CBC    Component Value Date/Time   WBC 6.9 12/19/2017 0219   HGB 10.4 (L) 12/19/2017 0219   HGB 8.8 (L) 01/17/2014 0502   HCT 31.2 (L) 12/19/2017 0219   HCT 27.9 (L) 01/17/2014 0502   PLT 208 12/19/2017 0219   PLT 212 01/17/2014 0502    BMET    Component Value Date/Time   NA 135 12/19/2017 0219   NA 139 01/17/2014 0502   K 3.8 12/19/2017 0219   K 4.8 01/17/2014 0502   CL 95 (L) 12/19/2017 0219   CL 102 01/17/2014 0502   CO2 31  12/19/2017 0219   CO2 27 01/17/2014 0502   GLUCOSE 72 12/19/2017 0219   GLUCOSE 101 (H) 01/17/2014 0502   BUN 12 12/19/2017 0219   BUN 62 (H) 01/17/2014 0502   CREATININE 6.19 (H) 12/19/2017 0219   CREATININE 12.30 (H) 01/17/2014 0502   CALCIUM 9.6 12/19/2017 0219   CALCIUM 8.2 (L) 01/17/2014 0502   GFRNONAA 8 (L) 12/19/2017 0219   GFRNONAA 4 (L) 01/17/2014 0502   GFRAA 10 (L) 12/19/2017 0219   GFRAA 4 (L) 01/17/2014 0502    Assessment/Planning:  Mixed ulceration left lateral malleolus: The patient maintains an excellent pulse his foot is clearly well perfused.  Again I do not see the advantage of vascular intervention at this time.  Given the patient's persistent complaint I did discuss the possibility of angiography to try to recruit the anterior tibial artery which clearly would be a more direct path to the ulcer.  I also discussed that under the vast majority of circumstances having a well-perfused foot requires one good tibial which he does have.  However, I stressed that this  would be more of a plan if his wound healing were an issue.  Considering the degree of swelling that he presented with it seems quite clear that he is not managing his venous insufficiency well at home but he does not acknowledge this.  At the present time an Unna boot seems to be appropriate therapy I did discuss the possibility of a [facility] ulcer care system as an alternative where he is wearing a 20-30 graduated compression stocking over a liner that holds his dressing in place.  Based on our discussion he did not feel this was an acceptable option.  He is admitted with issues unrelated to his leg and when he is ready for discharge based on his intractable nausea I would recommend discharge with follow-up in the office.  He should follow-up with the wound care center regarding wound care and he should follow-up with podiatry regarding his ankle pain.  Again I do not believe his current complaints are related to his  atherosclerotic occlusive disease as he has a palpable pulse and his foot is well-perfused.   Hortencia Pilar  12/20/2017, 4:20 PM

## 2017-12-20 NOTE — Progress Notes (Signed)
Post HD assessment. Pt tolerated tx well without c/o. PT experienced elevated HR and frequent multiform PVCs, with instances of SVT, MD aware. BFR was decreased, UF turned off and MD notified. Net UF -10, goal not met. Pt was non-symptomatic, MD gave verbal orders to d/c IV fluids.    12/20/17 2011  Vital Signs  Temp 99 F (37.2 C)  Temp Source Oral  Pulse Rate (!) 102  Pulse Rate Source Monitor  Resp 13  BP (!) 155/80  BP Location Right Arm  BP Method Automatic  Patient Position (if appropriate) Lying  Oxygen Therapy  SpO2 98 %  O2 Device Room Air  Dialysis Weight  Weight 73.1 kg  Type of Weight Post-Dialysis  Post-Hemodialysis Assessment  Rinseback Volume (mL) 250 mL  KECN 49.2 V  Dialyzer Clearance Lightly streaked  Duration of HD Treatment -hour(s) 3 hour(s)  Hemodialysis Intake (mL) 500 mL  UF Total -Machine (mL) 490 mL  Net UF (mL) -10 mL  Tolerated HD Treatment Yes  AVG/AVF Arterial Site Held (minutes) 10 minutes  AVG/AVF Venous Site Held (minutes) 10 minutes  Fistula / Graft Left Upper arm  No Placement Date or Time found.   Orientation: Left  Access Location: Upper arm  Site Condition No complications  Fistula / Graft Assessment Present;Thrill;Bruit  Status Deaccessed  Drainage Description None

## 2017-12-20 NOTE — Plan of Care (Signed)
Patient arrived to dialysis center. Patient is confused. He is alert but only oriented to self. He refuses dialysis. MD aware. MD states to attempt dialysis after lunch.

## 2017-12-20 NOTE — Progress Notes (Signed)
Post HD assessment    12/20/17 2009  Neurological  Level of Consciousness Alert  Orientation Level Oriented to person;Oriented to situation  Cardiac  ECG Monitor Yes  Cardiac Rhythm ST  Ectopy Multifocal PVC's  Ectopy Frequency Frequent  Antiarrhythmic device No  Vascular  R Radial Pulse +2  L Radial Pulse +2  Integumentary  Integumentary (WDL) X  Skin Color Appropriate for ethnicity  Musculoskeletal  Musculoskeletal (WDL) X  Generalized Weakness Yes  GU Assessment  Genitourinary (WDL) X  Genitourinary Symptoms  (HD)  Psychosocial  Psychosocial (WDL) WDL

## 2017-12-20 NOTE — Progress Notes (Addendum)
Johnny Darby, MD [address]  [place]  [place], [place] [address]  Main: [phone]  Fax: [phone] Pager: [phone]   Subjective: Patient is tolerating clear liquid diet. Underwent EGD yesterday which revealed moderate amount retained gastric contents.   Objective: Vital signs in last 24 hours: Vitals:   12/20/17 0945 12/20/17 1122 12/20/17 1627 12/20/17 1641  BP: (!) 163/61 (!) 155/62 (!) 170/68 (!) 194/83  Pulse: 79 72 88 87  Resp: 12 17 14 13   Temp:  (!) 97.5 F (36.4 C) 98.7 F (37.1 C)   TempSrc:  Axillary Oral   SpO2: 94% 96% 98% 97%  Weight:      Height:       Weight change: 0 kg  Intake/Output Summary (Last 24 hours) at 12/20/2017 1703 Last data filed at 12/20/2017 7893 Gross per 24 hour  Intake 220 ml  Output -  Net 220 ml     Exam: Heart:: Regular rate and rhythm or S1S2 present Lungs: clear to auscultation Abdomen: soft, nontender, normal bowel sounds   Lab Results: @LABTEST2 @ Micro Results: No results found for this or any previous visit (from the past 240 hour(s)). Studies/Results: Ct Angio Abd/pel W And/or Wo Contrast  Result Date: 12/18/2017 CLINICAL DATA:  67 year old with end-stage renal disease on hemodialysis with 6 week history of abdominal pain that acutely worsened after dialysis today. Unexplained 30 pound weight loss over the past 1 month. EXAM: CTA ABDOMEN AND PELVIS WITH CONTRAST TECHNIQUE: Multidetector CT imaging of the abdomen and pelvis was performed using the standard protocol during bolus administration of intravenous contrast. Multiplanar reconstructed images and MIPs were obtained and reviewed to evaluate the vascular anatomy. CONTRAST:  138mL OMNIPAQUE IOHEXOL 350 MG/ML IV. COMPARISON:  None. FINDINGS: VASCULAR Aorta: Severe atherosclerosis with calcified and noncalcified plaque. No evidence of aneurysm. Celiac: Atherosclerosis at its origin without evidence of hemodynamically significant stenosis. SMA:  Atherosclerosis at its origin and in its descending portion without evidence of significant stenosis. Renals: 2 RIGHT renal arteries with possible hemodynamically significant stenosis at the origin of the RIGHT main renal artery very small accessory UPPER pole renal artery which arises from the aorta just proximal to the main renal artery. Single LEFT renal artery with proximal atherosclerosis though no evidence of hemodynamically significant stenosis. IMA: Patent. Inflow: Severe BILATERAL common iliac artery atherosclerosis without evidence of hemodynamically significant stenosis. Proximal Outflow: Severe BILATERAL common femoral and proximal superficial femoral artery atherosclerosis with possible hemodynamically significant stenoses in common femoral arteries. Veins: Not evaluated. Review of the MIP images confirms the above findings. NON-VASCULAR Lower chest: Minimal scarring involving the LEFT LOWER LOBE. Visualized lung bases otherwise clear. Heart markedly enlarged with LAD and RIGHT coronary atherosclerosis and mitral annular calcification. Hepatobiliary: Liver normal in size and appearance. Gallbladder normal in appearance without calcified gallstones. No biliary ductal dilation. Pancreas: Normal in appearance without evidence of mass, ductal dilation, or inflammation. Spleen: Normal in size and appearance. Small focus of accessory splenic tissue MEDIAL to the LOWER pole adjacent the pancreatic tail. Adrenals/Urinary Tract: Normal appearing adrenal glands. Both kidneys demonstrate contrast enhancement, though are atrophic and contain numerous cortical cysts. No urinary tract calculi. No hydronephrosis. Urinary bladder unremarkable. Stomach/Bowel: Stomach normal in appearance for the degree of distention. Normal-appearing small bowel. Sigmoid colon elongated without evidence of diverticulosis. Remainder of the colon normal in appearance. Normal appendix in the RIGHT mid abdomen and RIGHT UPPER pelvis just  ANTERIOR to the RIGHT psoas muscle. Lymphatic: No pathologic lymphadenopathy. Reproductive:  Moderate prostate gland enlargement, particularly the median lobe. Scattered seminal vesicle calcifications. Other: Large BILATERAL inguinal hernias containing fat. High riding RIGHT testicle versus mass in the RIGHT inguinal canal. Small umbilical hernia containing fat. Musculoskeletal: DISH involving the LOWER thoracic spine. Large Schmorl's nodes involving L1, L2 and L4. Severe facet degenerative changes throughout the lumbar spine. Ankylosis of the sacroiliac joints. No acute findings. IMPRESSION: VASCULAR 1. 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 2. Possible hemodynamically significant stenosis involving the origin of the RIGHT main renal artery. Please note that there is a small accessory RIGHT UPPER pole renal artery which originates from the aorta just above the main renal artery. 3. Severe BILATERAL iliofemoral atherosclerosis with possible hemodynamically significant stenoses involving both common femoral arteries. 4. 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 NON-VASCULAR 1. No acute abnormalities involving the abdomen or pelvis. 2. 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 3. Large BILATERAL inguinal hernias containing fat. High riding RIGHT testicle versus mass in the RIGHT inguinal canal. Please correlate with physical examination as to the location of the RIGHT testicle. Electronically Signed   By: Evangeline Dakin M.D.   On: 12/18/2017 20:55   Medications: I have reviewed the patient's current medications. Scheduled Meds: . acetaminophen  650 mg Oral Q6H  . allopurinol  100 mg Oral Daily  . amLODipine  10 mg Oral Daily  . atorvastatin  10 mg Oral Daily  . Chlorhexidine Gluconate Cloth  6 each Topical Q0600  . clonazepam  0.25 mg Oral QHS  . clopidogrel  75 mg Oral Daily  .  famotidine  20 mg Oral Daily  . feeding supplement (NEPRO CARB STEADY)  237 mL Oral BID BM  . furosemide  40 mg Oral Daily  . gabapentin  200 mg Oral BID  . heparin  5,000 Units Subcutaneous Q8H  . hydrALAZINE  25 mg Oral Daily  . irbesartan  300 mg Oral Daily  . multivitamin  1 tablet Oral QHS  . multivitamin-lutein  1 capsule Oral Daily   Continuous Infusions: . sodium chloride 100 mL/hr at 12/19/17 1217   PRN Meds:.metoCLOPramide (REGLAN) injection, morphine injection, ondansetron **OR** ondansetron (ZOFRAN) IV, promethazine, traMADol   Assessment: Principal Problem:   Intractable nausea and vomiting Active Problems:   ESRD on dialysis (HCC)   HTN (hypertension)   Chronic diastolic CHF (congestive heart failure) (HCC)   Atherosclerosis of native arteries of the extremities with ulceration ([place])   Protein-calorie malnutrition, severe  Status post EGD on 12/19/2017 which revealed gastritis and retained gastric contents concerning for gastroparesis  Plan: Continue Zofran and Phenergan every 6-8 hours Reglan if needed Advance diet as tolerated to low residue diet only Recommend gastric emptying study upon discharge Follow up with GI as outpatient  Please call GI back with questions/concerns   LOS: 0 days   Rohini Vanga 12/20/2017, 5:03 PM

## 2017-12-20 NOTE — Progress Notes (Signed)
Per MD okay for RN to stop iv Fluids.

## 2017-12-20 NOTE — Progress Notes (Signed)
HD tx end    12/20/17 2000  Vital Signs  Pulse Rate (!) 110  Pulse Rate Source Monitor  Resp 13  BP (!) 164/91  BP Location Right Arm  BP Method Automatic  Patient Position (if appropriate) Lying  Oxygen Therapy  SpO2 98 %  O2 Device Room Air  During Hemodialysis Assessment  Dialysis Fluid Bolus Normal Saline  Bolus Amount (mL) 250 mL  Intra-Hemodialysis Comments Tx completed

## 2017-12-20 NOTE — Progress Notes (Signed)
HD tx start    12/20/17 1641  Vital Signs  Pulse Rate 87  Pulse Rate Source Monitor  Resp 13  BP (!) 194/83  BP Location Right Arm  BP Method Automatic  Patient Position (if appropriate) Lying  Oxygen Therapy  SpO2 97 %  O2 Device Room Air  During Hemodialysis Assessment  Blood Flow Rate (mL/min) 400 mL/min  Arterial Pressure (mmHg) -190 mmHg  Venous Pressure (mmHg) 190 mmHg  Transmembrane Pressure (mmHg) 70 mmHg  Ultrafiltration Rate (mL/min) 340 mL/min  Dialysate Flow Rate (mL/min) 800 ml/min  Conductivity: Machine  14.3  HD Safety Checks Performed Yes  Dialysis Fluid Bolus Normal Saline  Bolus Amount (mL) 250 mL  Intra-Hemodialysis Comments Tx initiated  Fistula / Graft Left Upper arm  No Placement Date or Time found.   Orientation: Left  Access Location: Upper arm  Status Accessed  Needle Size 15

## 2017-12-20 NOTE — Progress Notes (Signed)
[facility], [place] 12/20/17  Subjective:   Patient known to our practice from outpatient dialysis This time he is admitted for ongoing nausea and vomiting CT scan of the abdomen done yesterday shows severe vascular disease but no hemodynamically significant stenosis  He underwent EGD yesterday.  Found to have gastroparesis and pill induced gastritis This morning when he went down for dialysis, he was confused therefore he was sent back to his room When seen this morning, he was able to answer most questions appropriately.  Objective:  Vital signs in last 24 hours:  Temp:  [94.5 F (34.7 C)-97.7 F (36.5 C)] 94.5 F (34.7 C) (08/14 0930) Pulse Rate:  [63-96] 79 (08/14 0945) Resp:  [12-20] 12 (08/14 0945) BP: (95-163)/(54-81) 163/61 (08/14 0945) SpO2:  [94 %-100 %] 94 % (08/14 0945) Weight:  [70 kg] 70 kg (08/13 1451)  Weight change: 0 kg Filed Weights   12/18/17 1916 12/19/17 1451  Weight: 70 kg 70 kg    Intake/Output:    Intake/Output Summary (Last 24 hours) at 12/20/2017 1059 Last data filed at 12/20/2017 0914 Gross per 24 hour  Intake 1050 ml  Output -  Net 1050 ml     Physical Exam: General:  No acute distress, laying in the bed  HEENT  anicteric, moist oral mucous membranes  Neck  supple  Pulm/lungs  normal breathing effort, clear to auscultation, nasal cannula oxygen  CVS/Heart  regular, no rub  Abdomen:   Soft, mild midepigastric tenderness  Extremities:  Left leg bandaged, right trace edema  Neurologic:  Alert, able to answer questions appropriately  Access:  AV fistula       Basic Metabolic Panel:  Recent Labs  Lab 12/18/17 1956 12/19/17 0219  NA 136 135  K 3.5 3.8  CL 96* 95*  CO2 29 31  GLUCOSE 91 72  BUN 11 12  CREATININE 5.74* 6.19*  CALCIUM 9.7 9.6  PHOS  --  4.6     CBC: Recent Labs  Lab 12/18/17 1904 12/19/17 0219  WBC 7.8 6.9  NEUTROABS 5.9  --   HGB 11.0* 10.4*  HCT 33.4* 31.2*  MCV 97.1  97.1  PLT 217 208      Lab Results  Component Value Date   HEPBSAG Negative 09/30/2015      Microbiology:  No results found for this or any previous visit (from the past 240 hour(s)).  Coagulation Studies: No results for input(s): LABPROT, INR in the last 72 hours.  Urinalysis: No results for input(s): COLORURINE, LABSPEC, PHURINE, GLUCOSEU, HGBUR, BILIRUBINUR, KETONESUR, PROTEINUR, UROBILINOGEN, NITRITE, LEUKOCYTESUR in the last 72 hours.  Invalid input(s): APPERANCEUR    Imaging: Ct Angio Abd/pel W And/or Wo Contrast  Result Date: 12/18/2017 CLINICAL DATA:  67 year old with end-stage renal disease on hemodialysis with 6 week history of abdominal pain that acutely worsened after dialysis today. Unexplained 30 pound weight loss over the past 1 month. EXAM: CTA ABDOMEN AND PELVIS WITH CONTRAST TECHNIQUE: Multidetector CT imaging of the abdomen and pelvis was performed using the standard protocol during bolus administration of intravenous contrast. Multiplanar reconstructed images and MIPs were obtained and reviewed to evaluate the vascular anatomy. CONTRAST:  134mL OMNIPAQUE IOHEXOL 350 MG/ML IV. COMPARISON:  None. FINDINGS: VASCULAR Aorta: Severe atherosclerosis with calcified and noncalcified plaque. No evidence of aneurysm. Celiac: Atherosclerosis at its origin without evidence of hemodynamically significant stenosis. SMA: Atherosclerosis at its origin and in its descending portion without evidence of significant stenosis. Renals: 2 RIGHT renal arteries  with possible hemodynamically significant stenosis at the origin of the RIGHT main renal artery very small accessory UPPER pole renal artery which arises from the aorta just proximal to the main renal artery. Single LEFT renal artery with proximal atherosclerosis though no evidence of hemodynamically significant stenosis. IMA: Patent. Inflow: Severe BILATERAL common iliac artery atherosclerosis without evidence of hemodynamically  significant stenosis. Proximal Outflow: Severe BILATERAL common femoral and proximal superficial femoral artery atherosclerosis with possible hemodynamically significant stenoses in common femoral arteries. Veins: Not evaluated. Review of the MIP images confirms the above findings. NON-VASCULAR Lower chest: Minimal scarring involving the LEFT LOWER LOBE. Visualized lung bases otherwise clear. Heart markedly enlarged with LAD and RIGHT coronary atherosclerosis and mitral annular calcification. Hepatobiliary: Liver normal in size and appearance. Gallbladder normal in appearance without calcified gallstones. No biliary ductal dilation. Pancreas: Normal in appearance without evidence of mass, ductal dilation, or inflammation. Spleen: Normal in size and appearance. Small focus of accessory splenic tissue MEDIAL to the LOWER pole adjacent the pancreatic tail. Adrenals/Urinary Tract: Normal appearing adrenal glands. Both kidneys demonstrate contrast enhancement, though are atrophic and contain numerous cortical cysts. No urinary tract calculi. No hydronephrosis. Urinary bladder unremarkable. Stomach/Bowel: Stomach normal in appearance for the degree of distention. Normal-appearing small bowel. Sigmoid colon elongated without evidence of diverticulosis. Remainder of the colon normal in appearance. Normal appendix in the RIGHT mid abdomen and RIGHT UPPER pelvis just ANTERIOR to the RIGHT psoas muscle. Lymphatic: No pathologic lymphadenopathy. Reproductive: Moderate prostate gland enlargement, particularly the median lobe. Scattered seminal vesicle calcifications. Other: Large BILATERAL inguinal hernias containing fat. High riding RIGHT testicle versus mass in the RIGHT inguinal canal. Small umbilical hernia containing fat. Musculoskeletal: DISH involving the LOWER thoracic spine. Large Schmorl's nodes involving L1, L2 and L4. Severe facet degenerative changes throughout the lumbar spine. Ankylosis of the sacroiliac joints.  No acute findings. IMPRESSION: VASCULAR 1. 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 2. Possible hemodynamically significant stenosis involving the origin of the RIGHT main renal artery. Please note that there is a small accessory RIGHT UPPER pole renal artery which originates from the aorta just above the main renal artery. 3. Severe BILATERAL iliofemoral atherosclerosis with possible hemodynamically significant stenoses involving both common femoral arteries. 4. 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 NON-VASCULAR 1. No acute abnormalities involving the abdomen or pelvis. 2. 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 3. Large BILATERAL inguinal hernias containing fat. High riding RIGHT testicle versus mass in the RIGHT inguinal canal. Please correlate with physical examination as to the location of the RIGHT testicle. Electronically Signed   By: Evangeline Dakin M.D.   On: 12/18/2017 20:55     Medications:   . sodium chloride 100 mL/hr at 12/19/17 1217  . famotidine (PEPCID) IV Stopped (12/20/17 0914)   . acetaminophen  650 mg Oral Q6H  . allopurinol  100 mg Oral Daily  . amLODipine  10 mg Oral Daily  . atorvastatin  10 mg Oral Daily  . Chlorhexidine Gluconate Cloth  6 each Topical Q0600  . clonazepam  0.25 mg Oral QHS  . clopidogrel  75 mg Oral Daily  . feeding supplement (NEPRO CARB STEADY)  237 mL Oral BID BM  . furosemide  40 mg Oral Daily  . gabapentin  200 mg Oral BID  . heparin  5,000 Units Subcutaneous Q8H  . hydrALAZINE  25 mg Oral Daily  . irbesartan  300 mg Oral Daily  .  multivitamin  1 tablet Oral QHS  . multivitamin-lutein  1 capsule Oral Daily   heparin, metoCLOPramide (REGLAN) injection, morphine injection, ondansetron **OR** ondansetron (ZOFRAN) IV, promethazine, traMADol  Assessment/ Plan:  67 y.o. African-American male ESRD, cardiomyopathy,  hypertension, anemia of ckd, shpth, e. coli bacteremia 9/12, left hand inflammatory arthritis resolved, angioplasty cephalic vein 3/14, access declot 9/15. colonoscopy 02/2015, admission for chest pain 11/18/17.  [facility]/ [facility]/MWF/LUE AVG/EDW kg  1.  End-stage renal disease 2.  Nausea and vomiting  3.  Secondary hyperparathyroidism 4.  Anemia chronic kidney disease 5.  Peripheral vascular disease 6.  Gastroparesis and gastritis.  EGD 12/19/2017  Plan: Reattempt hemodialysis later today Trial of Nepro Hold sevelamer, calcium acetate and cinacalcet until nausea and vomiting is resolved epo with HD Currently getting IV metoclopramide for gastroparesis.  Assess response     LOS: 0 Shakeerah Gradel Candiss Norse 8/14/201910:59 AM  [place], [place]  Note: This note was prepared with Dragon dictation. Any transcription errors are unintentional

## 2017-12-20 NOTE — Progress Notes (Signed)
Pre HD assessment    12/20/17 1628  Neurological  Level of Consciousness Alert  Orientation Level Oriented to person;Oriented to situation  Respiratory  Respiratory Pattern Regular;Unlabored  Chest Assessment Chest expansion symmetrical  Cardiac  ECG Monitor Yes  Vascular  R Radial Pulse +2  L Radial Pulse +2  Integumentary  Integumentary (WDL) X  Skin Color Appropriate for ethnicity  Musculoskeletal  Musculoskeletal (WDL) X  Generalized Weakness Yes  Assistive Device None  GU Assessment  Genitourinary (WDL) X  Genitourinary Symptoms  (HD)  Psychosocial  Psychosocial (WDL) WDL

## 2017-12-21 ENCOUNTER — Observation Stay: Payer: Medicare Other

## 2017-12-21 DIAGNOSIS — R410 Disorientation, unspecified: Secondary | ICD-10-CM

## 2017-12-21 LAB — SURGICAL PATHOLOGY

## 2017-12-21 LAB — URIC ACID: Uric Acid, Serum: 2.7 mg/dL — ABNORMAL LOW (ref 3.7–8.6)

## 2017-12-21 LAB — LACTIC ACID, PLASMA: Lactic Acid, Venous: 2.1 mmol/L (ref 0.5–1.9)

## 2017-12-21 LAB — PROCALCITONIN: PROCALCITONIN: 4.51 ng/mL

## 2017-12-21 MED ORDER — HALOPERIDOL LACTATE 5 MG/ML IJ SOLN
1.0000 mg | Freq: Four times a day (QID) | INTRAMUSCULAR | Status: DC | PRN
  Filled 2017-12-21: qty 0.2

## 2017-12-21 MED ORDER — VANCOMYCIN HCL 10 G IV SOLR
1500.0000 mg | Freq: Once | INTRAVENOUS | Status: AC
  Administered 2017-12-21: 1500 mg via INTRAVENOUS
  Filled 2017-12-21: qty 1500

## 2017-12-21 MED ORDER — VANCOMYCIN HCL IN DEXTROSE 750-5 MG/150ML-% IV SOLN
750.0000 mg | INTRAVENOUS | Status: DC
  Administered 2017-12-22: 750 mg via INTRAVENOUS
  Filled 2017-12-21 (×2): qty 150

## 2017-12-21 MED ORDER — PIPERACILLIN-TAZOBACTAM 3.375 G IVPB
3.3750 g | Freq: Two times a day (BID) | INTRAVENOUS | Status: DC
  Administered 2017-12-21 – 2017-12-24 (×6): 3.375 g via INTRAVENOUS
  Filled 2017-12-21 (×7): qty 50

## 2017-12-21 NOTE — Clinical Social Work Note (Signed)
Clinical Social Work Assessment  Patient Details  Name: Johnny [name]. MRN: [id number] Date of Birth: Mar 16, 1951  Date of referral:  12/21/17               Reason for consult:  Discharge Planning                Permission sought to share information with:    Permission granted to share information::     Name::        Agency::     Relationship::     Contact Information:     Housing/Transportation Living arrangements for the past 2 months:  [place] of Information:  Other (Comment Required) Patient Interpreter Needed:  None Criminal Activity/Legal Involvement Pertinent to Current Situation/Hospitalization:  No - Comment as needed Significant Relationships:  Other Family Members Lives with:  Facility Resident Do you feel safe going back to the place where you live?  Yes Need for family participation in patient care:  Yes (Comment)  Care giving concerns:  Patient has been at [facility] for short term rehab.   Social Worker assessment / plan: It was not known that patient was from [facility] until today when nephrology informed us. CSW confirmed with [facility] that is a resident and that he can return but they will have to get re-auth. Patient is confused and unable to complete assessment with patient. CSW contacted patient's contact: [name]: [phone]. She confirmed that patient will return to [facility] at discharge.  Employment status:    Insurance information:    PT Recommendations:    Information / Referral to community resources:     Patient/Family's Response to care:  Patient expressed appreciation for CSW assistance.  Patient/Family's Understanding of and Emotional Response to Diagnosis, Current Treatment, and Prognosis:  Patient's friend was very receptive to call and very pleasant.  Emotional Assessment Appearance:  Appears stated age Attitude/Demeanor/Rapport:  (Patient confused X4) Affect (typically observed):    Orientation:     Alcohol / Substance use:  Not Applicable Psych involvement (Current and /or in the community):  No (Comment)  Discharge Needs  Concerns to be addressed:  Care Coordination Readmission within the last 30 days:  No Current discharge risk:  None Barriers to Discharge:  Continued Medical Work up   [employer], [place] 12/21/2017, 5:07 PM

## 2017-12-21 NOTE — Consult Note (Signed)
[facility] Face-to-Face Psychiatry Consult   Reason for Consult: Consult for this 67 year old man in the hospital with cellulitis and multiple complications of atherosclerosis.  Concern about confusion. Referring Physician: Gouru Patient Identification: Johnny [name]. MRN:  [id number] Principal Diagnosis: Acute delirium Diagnosis:   Patient Active Problem List   Diagnosis Date Noted  . Acute delirium [R41.0] 12/21/2017  . Protein-calorie malnutrition, severe [E43] 12/19/2017  . Intractable nausea and vomiting [R11.2] 12/18/2017  . Lymphedema [I89.0] 12/13/2017  . Cellulitis [L03.90] 11/27/2017  . Chest pain [R07.9] 11/19/2017  . Atherosclerosis of native arteries of the extremities with ulceration ([place]) [I70.25] 11/07/2017  . Elevated troponin [R74.8] 10/02/2015  . Complications, dialysis, catheter, mechanical ([place]) [T82.49XA] 10/02/2015  . Musculoskeletal chest pain [R07.89] 09/28/2015  . ESRD on dialysis ([name]) [N18.6, Z99.2] 09/28/2015  . HTN (hypertension) [I10] 09/28/2015  . Chronic diastolic CHF (congestive heart failure) ([place]) [I50.32] 09/28/2015  . Gout [M10.9] 09/28/2015    Total Time spent with patient: 1 hour  Subjective:   Johnny [name]. is a 67 y.o. male patient admitted with "it was originally about my leg".  HPI: Patient seen chart reviewed.  This is a 67 year old man with multiple medical problems who is being pended for discharge today.  Apparently there was some concern about confusion.  Note from hospitalist describes the patient as being very confused this afternoon.  I saw the patient this evening and he was awake and visiting appropriately with his sister and another visitor.  He was cooperative and pleasant during the interview.  Patient was oriented to his place the time and the situation.  He was able to describe his medical problems reasonably well.  Patient says that he thinks earlier today he was feeling a little confused but claims that now he feels like he  is mentally at his baseline.  Denies that he has had any awareness of any hallucinations.  Denies any depression denies any psychotic symptoms completely denies any suicidal or homicidal ideation.  Does admit that he may have some problems intermittently with his memory.  Social history: Patient lives in [place].  Not married no children.  A sister seems to be a close relative.  Medical history: Long-standing dialysis patient hypertension CHF intractable recent nausea and vomiting cellulitis.  Substance abuse history: Denies alcohol or drug abuse or any past substance abuse problems  Past Psychiatric History: Denies any past psychiatric treatment.  Never seen a psychiatrist therapist or mental health provider.  No history of suicide attempts or violence.  Denies any history of psychiatric medicine.  Risk to Self:   Risk to Others:   Prior Inpatient Therapy:   Prior Outpatient Therapy:    Past Medical History:  Past Medical History:  Diagnosis Date  . Anemia   . CHF (congestive heart failure) ([place])   . Chronic kidney disease   . Gout   . Hyperlipidemia   . Hypertension     Past Surgical History:  Procedure Laterality Date  . A/V SHUNTOGRAM Left 06/21/2017   Procedure: A/V SHUNTOGRAM;  Surgeon: Katha Cabal, MD;  Location: [place] [facility];  Service: Cardiovascular;  Laterality: Left;  . AV FISTULA PLACEMENT Left 09/18/2015   Procedure: INSERTION OF ARTERIOVENOUS (AV) GORE-TEX GRAFT ARM ( BRACH/AXILLARY GRAFT W/ INSTANT STICK GRAFT );  Surgeon: Katha Cabal, MD;  Location: [facility];  Service: Vascular;  Laterality: Left;  . DIALYSIS FISTULA CREATION    . LOWER EXTREMITY ANGIOGRAPHY Left 11/16/2017   Procedure: LOWER EXTREMITY ANGIOGRAPHY;  Surgeon: Algernon Huxley, MD;  Location: [place] [facility];  Service: Cardiovascular;  Laterality: Left;  . PERIPHERAL VASCULAR CATHETERIZATION Left 09/01/2015   Procedure: A/V Shuntogram/Fistulagram;  Surgeon: Katha Cabal, MD;  Location: [place] [facility];  Service: Cardiovascular;  Laterality: Left;  . PERIPHERAL VASCULAR CATHETERIZATION N/A 09/30/2015   Procedure: A/V Shuntogram/Fistulagram with perm cathether removal;  Surgeon: Algernon Huxley, MD;  Location: [place] [facility];  Service: Cardiovascular;  Laterality: N/A;  . PERIPHERAL VASCULAR CATHETERIZATION Left 09/30/2015   Procedure: A/V Shunt Intervention;  Surgeon: Algernon Huxley, MD;  Location: [place] [facility];  Service: Cardiovascular;  Laterality: Left;  . PERIPHERAL VASCULAR CATHETERIZATION Left 12/03/2015   Procedure: Thrombectomy;  Surgeon: Algernon Huxley, MD;  Location: [place] [facility];  Service: Cardiovascular;  Laterality: Left;  . PERIPHERAL VASCULAR CATHETERIZATION Left 01/28/2016   Procedure: Thrombectomy;  Surgeon: Algernon Huxley, MD;  Location: [place] [facility];  Service: Cardiovascular;  Laterality: Left;  . PERIPHERAL VASCULAR CATHETERIZATION N/A 01/28/2016   Procedure: A/V Shuntogram/Fistulagram;  Surgeon: Algernon Huxley, MD;  Location: [place] [facility];  Service: Cardiovascular;  Laterality: N/A;   Family History:  Family History  Problem Relation Age of Onset  . Hypertension Unknown   . Heart disease Unknown    Family Psychiatric  History: Says his father was a drinker. Social History:  Social History   Substance and Sexual Activity  Alcohol Use No     Social History   Substance and Sexual Activity  Drug Use No    Social History   Socioeconomic History  . Marital status: Single    Spouse name: Not on file  . Number of children: Not on file  . Years of education: Not on file  . Highest education level: Not on file  Occupational History  . Occupation: retired  Scientific laboratory technician  . Financial resource strain: Not on file  . Food insecurity:    Worry: Not on file    Inability: Not on file  . Transportation needs:    Medical: Not on file    Non-medical: Not on file  Tobacco Use  . Smoking status: Former  Research scientist (life sciences)  . Smokeless tobacco: Never Used  Substance and Sexual Activity  . Alcohol use: No  . Drug use: No  . Sexual activity: Not on file  Lifestyle  . Physical activity:    Days per week: Not on file    Minutes per session: Not on file  . Stress: Not on file  Relationships  . Social connections:    Talks on phone: Not on file    Gets together: Not on file    Attends religious service: Not on file    Active member of club or organization: Not on file    Attends meetings of clubs or organizations: Not on file    Relationship status: Not on file  Other Topics Concern  . Not on file  Social History Narrative  . Not on file   Additional Social History:    Allergies:   Allergies  Allergen Reactions  . Shellfish Allergy Anaphylaxis    Labs:  Results for orders placed or performed during the hospital encounter of 12/18/17 (from the past 48 hour(s))  Uric acid     Status: Abnormal   Collection Time: 12/21/17  2:03 PM  Result Value Ref Range   Uric Acid, Serum 2.7 (L) 3.7 - 8.6 mg/dL    Comment: Performed at [facility], [place], [place] [address]  Lactic acid, plasma     Status: Abnormal   Collection Time: 12/21/17  2:03 PM  Result Value Ref Range   Lactic Acid, Venous 2.1 (HH) 0.5 - 1.9 mmol/L    Comment: CRITICAL RESULT CALLED TO, READ BACK BY AND VERIFIED WITH GINA BENTON @1511  12/21/17 AKT Performed at [facility], [address] [place], [place] [address]   Procalcitonin - Baseline     Status: None   Collection Time: 12/21/17  2:03 PM  Result Value Ref Range   Procalcitonin 4.51 ng/mL    Comment:        Interpretation: PCT > 2 ng/mL: Systemic infection (sepsis) is likely, unless other causes are known. (NOTE)       Sepsis PCT Algorithm           Lower Respiratory Tract                                      Infection PCT Algorithm    ----------------------------     ----------------------------         PCT < 0.25 ng/mL                 PCT < 0.10 ng/mL         Strongly encourage             Strongly discourage   discontinuation of antibiotics    initiation of antibiotics    ----------------------------     -----------------------------       PCT 0.25 - 0.50 ng/mL            PCT 0.10 - 0.25 ng/mL               OR       >80% decrease in PCT            Discourage initiation of                                            antibiotics      Encourage discontinuation           of antibiotics    ----------------------------     -----------------------------         PCT >= 0.50 ng/mL              PCT 0.26 - 0.50 ng/mL               AND       <80% decrease in PCT              Encourage initiation of                                             antibiotics       Encourage continuation           of antibiotics    ----------------------------     -----------------------------        PCT >= 0.50 ng/mL                  PCT > 0.50 ng/mL  AND         increase in PCT                  Strongly encourage                                      initiation of antibiotics    Strongly encourage escalation           of antibiotics                                     -----------------------------                                           PCT <= 0.25 ng/mL                                                 OR                                        > 80% decrease in PCT                                     Discontinue / Do not initiate                                             antibiotics Performed at [facility], [address] [place], [place] [address]     Current Facility-Administered Medications  Medication Dose Route Frequency Provider Last Rate Last Dose  . acetaminophen (TYLENOL) tablet 650 mg  650 mg Oral Q6H Murlean Iba, MD   650 mg at 12/21/17 1740  . allopurinol (ZYLOPRIM) tablet 100 mg  100 mg Oral Daily Lance Coon, MD   100 mg at 12/21/17 0943  . amLODipine (NORVASC) tablet 10 mg  10 mg  Oral Daily Lance Coon, MD   10 mg at 12/21/17 0943  . atorvastatin (LIPITOR) tablet 10 mg  10 mg Oral Daily Lance Coon, MD   10 mg at 12/21/17 1740  . Chlorhexidine Gluconate Cloth 2 % PADS 6 each  6 each Topical Q0600 Murlean Iba, MD   6 each at 12/20/17 0854  . clonazePAM (KLONOPIN) disintegrating tablet 0.25 mg  0.25 mg Oral Corwin Levins, MD   0.25 mg at 12/20/17 2132  . clopidogrel (PLAVIX) tablet 75 mg  75 mg Oral Daily Lance Coon, MD   75 mg at 12/21/17 0943  . famotidine (PEPCID) tablet 20 mg  20 mg Oral Daily Gouru, Aruna, MD   20 mg at 12/21/17 0943  . feeding supplement (NEPRO CARB STEADY) liquid 237 mL  237 mL Oral BID BM Candiss Norse, Harmeet, MD   237 mL at 12/20/17 1353  . furosemide (LASIX) tablet 40 mg  40 mg Oral Daily Willis,  Shanon Brow, MD   40 mg at 12/21/17 0943  . gabapentin (NEURONTIN) capsule 200 mg  200 mg Oral BID Lance Coon, MD   200 mg at 12/21/17 0944  . heparin injection 5,000 Units  5,000 Units Subcutaneous Driscilla Moats, MD   5,000 Units at 12/21/17 0867  . hydrALAZINE (APRESOLINE) tablet 25 mg  25 mg Oral Daily Lance Coon, MD   25 mg at 12/21/17 0943  . irbesartan (AVAPRO) tablet 300 mg  300 mg Oral Daily Lance Coon, MD   300 mg at 12/21/17 0942  . metoCLOPramide (REGLAN) injection 5 mg  5 mg Intravenous Q6H PRN Gouru, Aruna, MD      . morphine 2 MG/ML injection 2 mg  2 mg Intravenous Q4H PRN Lance Coon, MD   2 mg at 12/19/17 1217  . multivitamin (RENA-VIT) tablet 1 tablet  1 tablet Oral QHS Nicholes Mango, MD   1 tablet at 12/20/17 2131  . multivitamin-lutein (OCUVITE-LUTEIN) capsule 1 capsule  1 capsule Oral Daily Gouru, Aruna, MD   1 capsule at 12/21/17 0943  . ondansetron (ZOFRAN) tablet 4 mg  4 mg Oral Q6H PRN Lance Coon, MD       Or  . ondansetron [facility]) injection 4 mg  4 mg Intravenous Q6H PRN Lance Coon, MD   4 mg at 12/19/17 1025  . piperacillin-tazobactam (ZOSYN) IVPB 3.375 g  3.375 g Intravenous Q12H Hallaji, Dani Gobble, RPH 12.5  mL/hr at 12/21/17 1810 3.375 g at 12/21/17 1810  . promethazine (PHENERGAN) injection 12.5 mg  12.5 mg Intravenous Q6H PRN Lance Coon, MD      . traMADol Veatrice Bourbon) tablet 50 mg  50 mg Oral Q12H PRN Lance Coon, MD   50 mg at 12/21/17 0758  . vancomycin (VANCOCIN) 1,500 mg in sodium chloride 0.9 % 500 mL IVPB  1,500 mg Intravenous Once Pernell Dupre, RPH 250 mL/hr at 12/21/17 1811 1,500 mg at 12/21/17 1811  . [START ON 12/22/2017] vancomycin (VANCOCIN) IVPB 750 mg/150 ml premix  750 mg Intravenous Q M,W,F-HD Hallaji, Dani Gobble, RPH        Musculoskeletal: Strength & Muscle Tone: decreased Gait & Station: unsteady Patient leans: N/A  Psychiatric Specialty Exam: Physical Exam  Nursing note and vitals reviewed. Constitutional: He appears well-developed and well-nourished.  HENT:  Head: Normocephalic and atraumatic.  Eyes: Pupils are equal, round, and reactive to light. Conjunctivae are normal.  Neck: Normal range of motion.  Cardiovascular: Regular rhythm and normal heart sounds.  Respiratory: Effort normal. No respiratory distress.  GI: Soft.  Musculoskeletal: Normal range of motion.  Neurological: He is alert.  Skin: Skin is warm and dry.  Psychiatric: His behavior is normal. Judgment and thought content normal. His mood appears anxious. His speech is delayed. He exhibits abnormal recent memory.    Review of Systems  Constitutional: Negative.   HENT: Negative.   Eyes: Negative.   Respiratory: Negative.   Cardiovascular: Negative.   Gastrointestinal: Negative.   Musculoskeletal: Negative.   Skin: Negative.   Neurological: Negative.   Psychiatric/Behavioral: Positive for memory loss. Negative for depression, hallucinations, substance abuse and suicidal ideas. The patient is nervous/anxious. The patient does not have insomnia.     Blood pressure 126/70, pulse 98, temperature 98 F (36.7 C), temperature source Oral, resp. rate 19, height 5\' 8"  (1.727 m), weight 73.1 kg, SpO2  99 %.Body mass index is 24.5 kg/m.  General Appearance: Fairly Groomed  Eye Contact:  Good  Speech:  Slow  Volume:  Decreased  Mood:  Euthymic  Affect:  Constricted  Thought Process:  Goal Directed  Orientation:  Full (Time, Place, and Person)  Thought Content:  Logical  Suicidal Thoughts:  No  Homicidal Thoughts:  No  Memory:  Immediate;   Fair Recent;   Poor Remote;   Fair  Judgement:  Fair  Insight:  Fair  Psychomotor Activity:  Normal  Concentration:  Concentration: Fair  Recall:  Poor  Fund of Knowledge:  Fair  Language:  Fair  Akathisia:  No  Handed:  Right  AIMS (if indicated):     Assets:  Desire for Improvement Housing Resilience Social Support  ADL's:  Impaired  Cognition:  Impaired,  Mild  Sleep:        Treatment Plan Summary: Daily contact with patient to assess and evaluate symptoms and progress in treatment, Medication management and Plan Patient was showing no signs of delirium during the interview.  Denies any mood symptoms.  No sign of depression or psychotic symptoms.  He does have significantly impaired short-term memory although his overall orientation to his situation is pretty good.  Not sure if he has had memory problems or any concern about dementia in the past.  With his medical problems it would certainly be expected that he would have some degree of vascular dementia.  Current level of dementia or confusion does not seem to be particularly clinically significant.  May have had more confusion earlier in the day due to his illness.  I will go ahead and put in as needed orders for Ativan but I doubt that he will need them.  No other psychiatric intervention.  I will check back if he is still here tomorrow.  Disposition: No evidence of imminent risk to self or others at present.   Patient does not meet criteria for psychiatric inpatient admission. Supportive therapy provided about ongoing stressors.  Alethia Berthold, MD 12/21/2017 8:04 PM

## 2017-12-21 NOTE — Progress Notes (Signed)
Lab unable to get [facility] d/t limited venous access.  Will start antibiotics without getting cultures.

## 2017-12-21 NOTE — Progress Notes (Signed)
[facility], [place] 12/21/17  Subjective:   Patient known to our practice from outpatient dialysis This time he is admitted for ongoing nausea and vomiting CT scan of the abdomen done yesterday shows severe vascular disease but no hemodynamically significant stenosis  He underwent EGD yesterday.  Found to have gastroparesis and pill induced gastritis Doing well today.  States that he is able to eat some food appetite remains low Able to ambulate in the room. Concerned about his belongings.  Objective:  Vital signs in last 24 hours:  Temp:  [97.5 F (36.4 C)-99.9 F (37.7 C)] 98.3 F (36.8 C) (08/15 0343) Pulse Rate:  [72-117] 97 (08/15 0343) Resp:  [10-23] 20 (08/15 0343) BP: (97-201)/(62-166) 132/75 (08/15 0343) SpO2:  [93 %-100 %] 100 % (08/15 0343) Weight:  [73.1 kg-73.2 kg] 73.1 kg (08/14 2011)  Weight change: 3.2 kg Filed Weights   12/19/17 1451 12/20/17 1627 12/20/17 2011  Weight: 70 kg 73.2 kg 73.1 kg    Intake/Output:    Intake/Output Summary (Last 24 hours) at 12/21/2017 1012 Last data filed at 12/20/2017 2011 Gross per 24 hour  Intake -  Output -10 ml  Net 10 ml     Physical Exam: General:  No acute distress, laying in the bed  HEENT  anicteric, moist oral mucous membranes  Neck  supple  Pulm/lungs  normal breathing effort, clear to auscultation, nasal cannula oxygen  CVS/Heart  regular, no rub  Abdomen:   Soft, mild midepigastric tenderness  Extremities:  Left leg bandaged, right trace edema  Neurologic:  Alert, able to answer questions appropriately  Access:  AV fistula       Basic Metabolic Panel:  Recent Labs  Lab 12/18/17 1956 12/19/17 0219  NA 136 135  K 3.5 3.8  CL 96* 95*  CO2 29 31  GLUCOSE 91 72  BUN 11 12  CREATININE 5.74* 6.19*  CALCIUM 9.7 9.6  PHOS  --  4.6     CBC: Recent Labs  Lab 12/18/17 1904 12/19/17 0219  WBC 7.8 6.9  NEUTROABS 5.9  --   HGB 11.0* 10.4*  HCT 33.4* 31.2*  MCV  97.1 97.1  PLT 217 208      Lab Results  Component Value Date   HEPBSAG Negative 09/30/2015      Microbiology:  No results found for this or any previous visit (from the past 240 hour(s)).  Coagulation Studies: No results for input(s): LABPROT, INR in the last 72 hours.  Urinalysis: No results for input(s): COLORURINE, LABSPEC, PHURINE, GLUCOSEU, HGBUR, BILIRUBINUR, KETONESUR, PROTEINUR, UROBILINOGEN, NITRITE, LEUKOCYTESUR in the last 72 hours.  Invalid input(s): APPERANCEUR    Imaging: No results found.   Medications:    . acetaminophen  650 mg Oral Q6H  . allopurinol  100 mg Oral Daily  . amLODipine  10 mg Oral Daily  . atorvastatin  10 mg Oral Daily  . Chlorhexidine Gluconate Cloth  6 each Topical Q0600  . clonazepam  0.25 mg Oral QHS  . clopidogrel  75 mg Oral Daily  . famotidine  20 mg Oral Daily  . feeding supplement (NEPRO CARB STEADY)  237 mL Oral BID BM  . furosemide  40 mg Oral Daily  . gabapentin  200 mg Oral BID  . heparin  5,000 Units Subcutaneous Q8H  . hydrALAZINE  25 mg Oral Daily  . irbesartan  300 mg Oral Daily  . multivitamin  1 tablet Oral QHS  . multivitamin-lutein  1 capsule Oral  Daily   metoCLOPramide (REGLAN) injection, morphine injection, ondansetron **OR** ondansetron (ZOFRAN) IV, promethazine, traMADol  Assessment/ Plan:  67 y.o. African-American male ESRD, cardiomyopathy, hypertension, anemia of ckd, shpth, e. coli bacteremia 9/12, left hand inflammatory arthritis resolved, angioplasty cephalic vein 3/56, access declot 9/15. colonoscopy 02/2015, admission for chest pain 11/18/17.  [facility]/ [facility]/MWF/LUE AVG/EDW kg  1.  Nausea and vomiting 2.  End-stage renal disease 3.  Secondary hyperparathyroidism 4.  Anemia chronic kidney disease 5.  Peripheral vascular disease 6.  Gastroparesis and gastritis.  EGD 12/19/2017  Plan: Next hemodialysis planned for tomorrow if patient is still in the hospital. Trial of Nepro Hold  sevelamer, calcium acetate and cinacalcet until nausea and vomiting is resolved.  We will reevaluate outpatient and restart binders 1 at a time epo with HD      LOS: 0 Mariabella Nilsen Candiss Norse 8/15/201910:12 AM  [place], [place]  Note: This note was prepared with Dragon dictation. Any transcription errors are unintentional

## 2017-12-21 NOTE — Progress Notes (Signed)
[place] at [place] NAME: Johnny [name]    MR#:  [id number]  DATE OF BIRTH:  1950/05/31  SUBJECTIVE:  CHIEF COMPLAINT: Patient is very confused today. REVIEW OF SYSTEMS:  Review of system unobtainable as the patient is confused DRUG ALLERGIES:   Allergies  Allergen Reactions  . Shellfish Allergy Anaphylaxis    VITALS:  Blood pressure 126/70, pulse 98, temperature 98 F (36.7 C), temperature source Oral, resp. rate 19, height 5\' 8"  (1.727 m), weight 73.1 kg, SpO2 99 %.  PHYSICAL EXAMINATION:  GENERAL:  67 y.o.-year-old patient lying in the bed with no acute distress.  EYES: Pupils equal, round, reactive to light and accommodation. No scleral icterus. Extraocular muscles intact.  HEENT: Head atraumatic, normocephalic. Oropharynx and nasopharynx clear.  NECK:  Supple, no jugular venous distention. No thyroid enlargement, no tenderness.  LUNGS: Normal breath sounds bilaterally, no wheezing, rales,rhonchi or crepitation. No use of accessory muscles of respiration.  CARDIOVASCULAR: S1, S2 normal. No murmurs, rubs, or gallops.  ABDOMEN: Soft, epigastric tenderness is present, no rebound tenderness, nondistended. Bowel sounds present. EXTREMITIES: No pedal edema, cyanosis, or clubbing.  NEUROLOGIC: ,Sensation intact. Gait not checked.  PSYCHIATRIC: The patient is alert and oriented x1-2 SKIN: No obvious rash, lesion, or ulcer.    LABORATORY PANEL:   CBC Recent Labs  Lab 12/19/17 0219  WBC 6.9  HGB 10.4*  HCT 31.2*  PLT 208   ------------------------------------------------------------------------------------------------------------------  Chemistries  Recent Labs  Lab 12/18/17 1956 12/19/17 0219  NA 136 135  K 3.5 3.8  CL 96* 95*  CO2 29 31  GLUCOSE 91 72  BUN 11 12  CREATININE 5.74* 6.19*  CALCIUM 9.7 9.6  AST 24  --   ALT 6  --   ALKPHOS 55  --   BILITOT 0.7  --     ------------------------------------------------------------------------------------------------------------------  Cardiac Enzymes Recent Labs  Lab 12/18/17 1956  TROPONINI 0.04*   ------------------------------------------------------------------------------------------------------------------  RADIOLOGY:  No results found.  EKG:   Orders placed or performed during the hospital encounter of 11/19/17  . EKG 12-Lead  . EKG 12-Lead    ASSESSMENT AND PLAN:   #Sepsis with elevated lactic acid, altered mental status and procalcitonin source unclear at this time We will get blood cultures, urine cultures and chest x-ray Wound culture of the left leg wound Wound care consult Broad-spectrum IV antibiotics Zosyn and vancomycin pharmacy to dose   #Intractable nausea and vomiting with significant unintentional weight loss EGD on 12/19/2017 has revealed gastroparesis and gastritis Nausea vomiting resolved with IV Reglan and Pepcid Tolerating advanced diet GI is recommending gastric emptying study upon discharge and follow-up with GI in 1 to 2 weeks after discharge IV fluids  #Severe protein calorie malnutrition secondary to acute illness of intractable nausea and vomiting for 2 weeks Seen by dietitian  #End-stage renal disease on hemodialysis continue hemodialysis per nephrology Dr. Candiss Norse is following Today morning patient is confused and dialysis is not done but patient is awake alert and oriented x3 during my examination  #Chronic diastolic congestive heart failure not volume overloaded at this time  resume home medications once patient tolerates diet  #Chronic peripheral vascular disease with venous ulcers Patient is reporting difficulty with ambulation.   Vascular surgery consult placed seen by Dr. Delana Meyer.  Recent MRI looks fine and no surgical interventions at this time.     All the records are reviewed and case discussed with Care Management/Social  Workerr. Management plans discussed with  the patient, family and they are in agreement.  CODE STATUS:   TOTAL TIME TAKING CARE OF THIS PATIENT: 36  minutes.   POSSIBLE D/C IN 2  DAYS, DEPENDING ON CLINICAL CONDITION.  Note: This dictation was prepared with Dragon dictation along with smaller phrase technology. Any transcriptional errors that result from this process are unintentional.   Nicholes Mango M.D on 12/21/2017 at 3:37 PM  Between 7am to 6pm - Pager - [phone] After 6pm go to [email or web address] - password [email or web address] [facility]  Office  [phone]  CC: Primary care physician; [name], MD

## 2017-12-21 NOTE — Progress Notes (Signed)
CRITICAL VALUE ALERT  Critical Value:  Lactic acid 2.1  Date & Time Notied: 12/21/2017 3:33 PM   Provider Notified: gouru  Orders Received/Actions taken: BC, UC, DG CHEST

## 2017-12-21 NOTE — Consult Note (Signed)
Pharmacy Antibiotic Note  Johnny [name]. is a 67 y.o. male admitted on 12/18/2017 with Intractable nausea and vomiting with significant unintentional weight loss. Patient now diagnosed with Sepsis (elvated lactic acid, AMS, and PCT 4.51).   Pharmacy has been consulted for Vancomycin and Zosyn dosing. Patient had PMH of ESRD and received HD on MWF.   Plan: Will order Vancomycin 1500mg  IV x 1 dose, and Vancomycin 750mg  IV with every HD session. Target Pre-HD level 15-25 mcg/mL. Level ordered prior to 3rd HD level.   Start Zosyn 3.375 IV EI every 12 hours.   Height: 5\' 8"  (172.7 cm) Weight: 161 lb 2.5 oz (73.1 kg) IBW/kg (Calculated) : 68.4  Temp (24hrs), Avg:98.8 F (37.1 C), Min:98 F (36.7 C), Max:99.9 F (37.7 C)  Recent Labs  Lab 12/18/17 1904 12/18/17 1956 12/19/17 0219 12/21/17 1403  WBC 7.8  --  6.9  --   CREATININE  --  5.74* 6.19*  --   LATICACIDVEN  --   --   --  2.1*    Estimated Creatinine Clearance: 10.9 mL/min (A) (by C-G formula based on SCr of 6.19 mg/dL (H)).    Allergies  Allergen Reactions  . Shellfish Allergy Anaphylaxis    Antimicrobials this admission: 8/15 Zosyn  >>  8/15 Vancomycin >>   Microbiology results: 8/15 BCx: Pending  8/15 UCx: Pending   Thank you for allowing pharmacy to be a part of this patient's care.  Pernell Dupre, PharmD, BCPS Clinical Pharmacist 12/21/2017 3:57 PM

## 2017-12-21 NOTE — Care Management (Signed)
Amanda Morris dialysis liaison notified of discharge  

## 2017-12-21 NOTE — NC FL2 (Signed)
[place] LEVEL OF CARE SCREENING TOOL     IDENTIFICATION  Patient Name: Johnny [name]. Birthdate: [birth year] Sex: male Admission Date (Current Location): 12/18/2017  [place] and [place] Number:  Engineering geologist and Address:  [facility], [address], [place], [place] [address]      Provider Number: [id number]  Attending Physician Name and Address:  Nicholes Mango, MD  Relative Name and Phone Number:       Current Level of Care: Hospital Recommended Level of Care: [place] Prior Approval Number:    Date Approved/Denied:   PASRR Number:    Discharge Plan: SNF    Current Diagnoses: Patient Active Problem List   Diagnosis Date Noted  . Protein-calorie malnutrition, severe 12/19/2017  . Intractable nausea and vomiting 12/18/2017  . Lymphedema 12/13/2017  . Cellulitis 11/27/2017  . Chest pain 11/19/2017  . Atherosclerosis of native arteries of the extremities with ulceration ([place]) 11/07/2017  . Elevated troponin 10/02/2015  . Complications, dialysis, catheter, mechanical ([place]) 10/02/2015  . Musculoskeletal chest pain 09/28/2015  . ESRD on dialysis ([place]) 09/28/2015  . HTN (hypertension) 09/28/2015  . Chronic diastolic CHF (congestive heart failure) ([place]) 09/28/2015  . Gout 09/28/2015    Orientation RESPIRATION BLADDER Height & Weight     Self, Time, Situation, Place  Normal Continent Weight: 161 lb 2.5 oz (73.1 kg) Height:  5\' 8"  (172.7 cm)  BEHAVIORAL SYMPTOMS/MOOD NEUROLOGICAL BOWEL NUTRITION STATUS  (none) (none) Incontinent Diet(renal)  AMBULATORY STATUS COMMUNICATION OF NEEDS Skin   Limited Assist Verbally (dehisced wound)                       Personal Care Assistance Level of Assistance  Bathing, Feeding, Dressing Bathing Assistance: Limited assistance Feeding assistance: Limited assistance Dressing Assistance: Limited assistance     Functional Limitations Info  (none)           SPECIAL CARE FACTORS FREQUENCY  PT (By licensed PT)                    Contractures Contractures Info: Not present    Additional Factors Info  Code Status, Allergies Code Status Info: full Allergies Info: shellfish           Current Medications (12/21/2017):  This is the current hospital active medication list Current Facility-Administered Medications  Medication Dose Route Frequency Provider Last Rate Last Dose  . acetaminophen (TYLENOL) tablet 650 mg  650 mg Oral Q6H Murlean Iba, MD   650 mg at 12/21/17 0943  . allopurinol (ZYLOPRIM) tablet 100 mg  100 mg Oral Daily Lance Coon, MD   100 mg at 12/21/17 0943  . amLODipine (NORVASC) tablet 10 mg  10 mg Oral Daily Lance Coon, MD   10 mg at 12/21/17 0943  . atorvastatin (LIPITOR) tablet 10 mg  10 mg Oral Daily Lance Coon, MD   10 mg at 12/20/17 2131  . Chlorhexidine Gluconate Cloth 2 % PADS 6 each  6 each Topical Q0600 Murlean Iba, MD   6 each at 12/20/17 0854  . clonazePAM (KLONOPIN) disintegrating tablet 0.25 mg  0.25 mg Oral Corwin Levins, MD   0.25 mg at 12/20/17 2132  . clopidogrel (PLAVIX) tablet 75 mg  75 mg Oral Daily Lance Coon, MD   75 mg at 12/21/17 0943  . famotidine (PEPCID) tablet 20 mg  20 mg Oral Daily Gouru, Aruna, MD   20 mg at 12/21/17 0943  .  feeding supplement (NEPRO CARB STEADY) liquid 237 mL  237 mL Oral BID BM Candiss Norse, Harmeet, MD   237 mL at 12/20/17 1353  . furosemide (LASIX) tablet 40 mg  40 mg Oral Daily Lance Coon, MD   40 mg at 12/21/17 0943  . gabapentin (NEURONTIN) capsule 200 mg  200 mg Oral BID Lance Coon, MD   200 mg at 12/21/17 0944  . heparin injection 5,000 Units  5,000 Units Subcutaneous Driscilla Moats, MD   5,000 Units at 12/21/17 7253  . hydrALAZINE (APRESOLINE) tablet 25 mg  25 mg Oral Daily Lance Coon, MD   25 mg at 12/21/17 0943  . irbesartan (AVAPRO) tablet 300 mg  300 mg Oral Daily Lance Coon, MD   300 mg at 12/21/17 0942  . metoCLOPramide  (REGLAN) injection 5 mg  5 mg Intravenous Q6H PRN Gouru, Aruna, MD      . morphine 2 MG/ML injection 2 mg  2 mg Intravenous Q4H PRN Lance Coon, MD   2 mg at 12/19/17 1217  . multivitamin (RENA-VIT) tablet 1 tablet  1 tablet Oral QHS Nicholes Mango, MD   1 tablet at 12/20/17 2131  . multivitamin-lutein (OCUVITE-LUTEIN) capsule 1 capsule  1 capsule Oral Daily Gouru, Aruna, MD   1 capsule at 12/21/17 0943  . ondansetron (ZOFRAN) tablet 4 mg  4 mg Oral Q6H PRN Lance Coon, MD       Or  . ondansetron [facility]) injection 4 mg  4 mg Intravenous Q6H PRN Lance Coon, MD   4 mg at 12/19/17 1025  . promethazine (PHENERGAN) injection 12.5 mg  12.5 mg Intravenous Q6H PRN Lance Coon, MD      . traMADol Veatrice Bourbon) tablet 50 mg  50 mg Oral Q12H PRN Lance Coon, MD   50 mg at 12/21/17 6644     Discharge Medications: Please see discharge summary for a list of discharge medications.  Relevant Imaging Results:  Relevant Lab Results:   Additional Information    Shela Leff, LCSW

## 2017-12-22 DIAGNOSIS — N2581 Secondary hyperparathyroidism of renal origin: Secondary | ICD-10-CM | POA: Diagnosis present

## 2017-12-22 DIAGNOSIS — Z8249 Family history of ischemic heart disease and other diseases of the circulatory system: Secondary | ICD-10-CM | POA: Diagnosis not present

## 2017-12-22 DIAGNOSIS — E1143 Type 2 diabetes mellitus with diabetic autonomic (poly)neuropathy: Secondary | ICD-10-CM | POA: Diagnosis present

## 2017-12-22 DIAGNOSIS — I429 Cardiomyopathy, unspecified: Secondary | ICD-10-CM | POA: Diagnosis present

## 2017-12-22 DIAGNOSIS — M064 Inflammatory polyarthropathy: Secondary | ICD-10-CM | POA: Diagnosis present

## 2017-12-22 DIAGNOSIS — R509 Fever, unspecified: Secondary | ICD-10-CM | POA: Diagnosis not present

## 2017-12-22 DIAGNOSIS — Z79899 Other long term (current) drug therapy: Secondary | ICD-10-CM | POA: Diagnosis not present

## 2017-12-22 DIAGNOSIS — N186 End stage renal disease: Secondary | ICD-10-CM | POA: Diagnosis present

## 2017-12-22 DIAGNOSIS — R410 Disorientation, unspecified: Secondary | ICD-10-CM | POA: Diagnosis not present

## 2017-12-22 DIAGNOSIS — I132 Hypertensive heart and chronic kidney disease with heart failure and with stage 5 chronic kidney disease, or end stage renal disease: Secondary | ICD-10-CM | POA: Diagnosis present

## 2017-12-22 DIAGNOSIS — K2970 Gastritis, unspecified, without bleeding: Secondary | ICD-10-CM | POA: Diagnosis present

## 2017-12-22 DIAGNOSIS — E1151 Type 2 diabetes mellitus with diabetic peripheral angiopathy without gangrene: Secondary | ICD-10-CM | POA: Diagnosis present

## 2017-12-22 DIAGNOSIS — Z833 Family history of diabetes mellitus: Secondary | ICD-10-CM | POA: Diagnosis not present

## 2017-12-22 DIAGNOSIS — E11621 Type 2 diabetes mellitus with foot ulcer: Secondary | ICD-10-CM | POA: Diagnosis present

## 2017-12-22 DIAGNOSIS — K5900 Constipation, unspecified: Secondary | ICD-10-CM | POA: Diagnosis present

## 2017-12-22 DIAGNOSIS — L97329 Non-pressure chronic ulcer of left ankle with unspecified severity: Secondary | ICD-10-CM | POA: Diagnosis present

## 2017-12-22 DIAGNOSIS — E43 Unspecified severe protein-calorie malnutrition: Secondary | ICD-10-CM | POA: Diagnosis present

## 2017-12-22 DIAGNOSIS — Z87891 Personal history of nicotine dependence: Secondary | ICD-10-CM | POA: Diagnosis not present

## 2017-12-22 DIAGNOSIS — Z7902 Long term (current) use of antithrombotics/antiplatelets: Secondary | ICD-10-CM | POA: Diagnosis not present

## 2017-12-22 DIAGNOSIS — I5032 Chronic diastolic (congestive) heart failure: Secondary | ICD-10-CM | POA: Diagnosis present

## 2017-12-22 DIAGNOSIS — D631 Anemia in chronic kidney disease: Secondary | ICD-10-CM | POA: Diagnosis present

## 2017-12-22 DIAGNOSIS — R109 Unspecified abdominal pain: Secondary | ICD-10-CM | POA: Diagnosis not present

## 2017-12-22 DIAGNOSIS — Z992 Dependence on renal dialysis: Secondary | ICD-10-CM | POA: Diagnosis not present

## 2017-12-22 DIAGNOSIS — R42 Dizziness and giddiness: Secondary | ICD-10-CM | POA: Diagnosis not present

## 2017-12-22 DIAGNOSIS — E1122 Type 2 diabetes mellitus with diabetic chronic kidney disease: Secondary | ICD-10-CM | POA: Diagnosis present

## 2017-12-22 DIAGNOSIS — E785 Hyperlipidemia, unspecified: Secondary | ICD-10-CM | POA: Diagnosis present

## 2017-12-22 DIAGNOSIS — K3184 Gastroparesis: Secondary | ICD-10-CM | POA: Diagnosis present

## 2017-12-22 DIAGNOSIS — L97529 Non-pressure chronic ulcer of other part of left foot with unspecified severity: Secondary | ICD-10-CM | POA: Diagnosis present

## 2017-12-22 DIAGNOSIS — R1033 Periumbilical pain: Secondary | ICD-10-CM | POA: Diagnosis present

## 2017-12-22 LAB — LACTIC ACID, PLASMA
Lactic Acid, Venous: 1 mmol/L (ref 0.5–1.9)
Lactic Acid, Venous: 0.7 mmol/L (ref 0.5–1.9)

## 2017-12-22 LAB — PROCALCITONIN: PROCALCITONIN: 4.1 ng/mL

## 2017-12-22 MED ORDER — SENNOSIDES-DOCUSATE SODIUM 8.6-50 MG PO TABS
1.0000 | ORAL_TABLET | Freq: Two times a day (BID) | ORAL | Status: DC
  Administered 2017-12-22 – 2017-12-25 (×7): 1 via ORAL
  Filled 2017-12-22 (×7): qty 1

## 2017-12-22 MED ORDER — PENTAFLUOROPROP-TETRAFLUOROETH EX AERO
INHALATION_SPRAY | Freq: Once | CUTANEOUS | Status: DC
  Filled 2017-12-22 (×2): qty 103.5

## 2017-12-22 MED ORDER — BISACODYL 5 MG PO TBEC
10.0000 mg | DELAYED_RELEASE_TABLET | Freq: Every day | ORAL | Status: DC
  Administered 2017-12-22 – 2017-12-25 (×4): 10 mg via ORAL
  Filled 2017-12-22 (×5): qty 2

## 2017-12-22 NOTE — Progress Notes (Signed)
[place] at [place] NAME: Johnny [name]    MR#:  [id number]  DATE OF BIRTH:  [birth year]  SUBJECTIVE:  CHIEF COMPLAINT: Patient is still confused REVIEW OF SYSTEMS:  Review of system unobtainable as the patient is confused DRUG ALLERGIES:   Allergies  Allergen Reactions  . Shellfish Allergy Anaphylaxis    VITALS:  Blood pressure (!) 158/71, pulse 91, temperature 98.4 F (36.9 C), temperature source Oral, resp. rate 16, height 5\' 8"  (1.727 m), weight 73.1 kg, SpO2 100 %.  PHYSICAL EXAMINATION:  GENERAL:  67 y.o.-year-old patient lying in the bed with no acute distress.  EYES: Pupils equal, round, reactive to light and accommodation. No scleral icterus. Extraocular muscles intact.  HEENT: Head atraumatic, normocephalic. Oropharynx and nasopharynx clear.  NECK:  Supple, no jugular venous distention. No thyroid enlargement, no tenderness.  LUNGS: Normal breath sounds bilaterally, no wheezing, rales,rhonchi or crepitation. No use of accessory muscles of respiration.  CARDIOVASCULAR: S1, S2 normal. No murmurs, rubs, or gallops.  ABDOMEN: Soft, epigastric tenderness is present, no rebound tenderness, nondistended. Bowel sounds present. EXTREMITIES: No pedal edema, cyanosis, or clubbing.  NEUROLOGIC: ,Sensation intact. Gait not checked.  PSYCHIATRIC: The patient is alert and oriented x1-2 SKIN: No obvious rash, lesion, or ulcer.    LABORATORY PANEL:   CBC Recent Labs  Lab 12/19/17 0219  WBC 6.9  HGB 10.4*  HCT 31.2*  PLT 208   ------------------------------------------------------------------------------------------------------------------  Chemistries  Recent Labs  Lab 12/18/17 1956 12/19/17 0219  NA 136 135  K 3.5 3.8  CL 96* 95*  CO2 29 31  GLUCOSE 91 72  BUN 11 12  CREATININE 5.74* 6.19*  CALCIUM 9.7 9.6  AST 24  --   ALT 6  --   ALKPHOS 55  --   BILITOT 0.7  --     ------------------------------------------------------------------------------------------------------------------  Cardiac Enzymes Recent Labs  Lab 12/18/17 1956  TROPONINI 0.04*   ------------------------------------------------------------------------------------------------------------------  RADIOLOGY:  Dg Chest 2 View  Result Date: 12/21/2017 CLINICAL DATA:  Elevated lactic acid level EXAM: CHEST - 2 VIEW COMPARISON:  11/19/2017 FINDINGS: Normal heart size and mediastinal contours. There is no edema, consolidation, effusion, or pneumothorax. Extensive atherosclerotic calcification the subclavians. There is venous stenting on the left is patient with dialysis access. IMPRESSION: No evidence of active disease. Electronically Signed   By: Monte Fantasia M.D.   On: 12/21/2017 69:11    EKG:   Orders placed or performed during the hospital encounter of 11/19/17  . EKG 12-Lead  . EKG 12-Lead    ASSESSMENT AND PLAN:   #Sepsis with elevated lactic acid, altered mental status and procalcitonin source unclear at this time We will get blood cultures, urine cultures and chest x-ray Wound culture of the left leg wound Wound care consult Broad-spectrum IV antibiotics Zosyn and vancomycin pharmacy to dose   #Intractable nausea and vomiting with significant unintentional weight loss EGD on 12/19/2017 has revealed gastroparesis and gastritis Nausea vomiting resolved with IV Reglan and Pepcid Tolerating advanced diet GI is recommending gastric emptying study upon discharge and follow-up with GI in 1 to 2 weeks after discharge IV fluids  #Severe protein calorie malnutrition secondary to acute illness of intractable nausea and vomiting for 2 weeks Seen by dietitian  #End-stage renal disease on hemodialysis continue hemodialysis per nephrology Dr. 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   Vascular surgery consult placed seen by Dr. Delana Meyer.  Recent MRI looks fine and no surgical interventions at this time.     All the records are reviewed and case discussed with Care Management/Social Workerr. Management plans discussed with the patient, family and they are in agreement.  CODE STATUS:   TOTAL TIME TAKING CARE OF THIS PATIENT: 36  minutes.   POSSIBLE D/C IN 2  DAYS, DEPENDING ON CLINICAL CONDITION.  Note: This dictation was prepared with Dragon dictation along with smaller phrase technology. Any transcriptional errors that result from this process are unintentional.   Nicholes Mango M.D on 12/22/2017 at 1:13 PM  Between 7am to 6pm - Pager - [phone] After 6pm go to [email or web address] - password [email or web address] [place] [facility]  Office  [phone]  CC: Primary care physician; [name], MD

## 2017-12-22 NOTE — Progress Notes (Signed)
[place] at [place] NAME: Johnny [name]    MR#:  [id number]  DATE OF BIRTH:  11-29-1950  SUBJECTIVE:  CHIEF COMPLAINT: Patient is still confused.  For hemodialysis today REVIEW OF SYSTEMS:  Review of system unobtainable as the patient is confused DRUG ALLERGIES:   Allergies  Allergen Reactions  . Shellfish Allergy Anaphylaxis    VITALS:  Blood pressure (!) 167/80, pulse 88, temperature 98.4 F (36.9 C), temperature source Oral, resp. rate 13, height 5\' 8"  (1.727 m), weight 73.1 kg, SpO2 100 %.  PHYSICAL EXAMINATION:  GENERAL:  67 y.o.-year-old patient lying in the bed with no acute distress.  EYES: Pupils equal, round, reactive to light and accommodation. No scleral icterus. Extraocular muscles intact.  HEENT: Head atraumatic, normocephalic. Oropharynx and nasopharynx clear.  NECK:  Supple, no jugular venous distention. No thyroid enlargement, no tenderness.  LUNGS: Normal breath sounds bilaterally, no wheezing, rales,rhonchi or crepitation. No use of accessory muscles of respiration.  CARDIOVASCULAR: S1, S2 normal. No murmurs, rubs, or gallops.  ABDOMEN: Soft, epigastric tenderness is present, no rebound tenderness, nondistended. Bowel sounds present. EXTREMITIES: No pedal edema, cyanosis, or clubbing.  NEUROLOGIC: ,Sensation intact. Gait not checked.  PSYCHIATRIC: The patient is alert and oriented x1-2 SKIN: No obvious rash, lesion, or ulcer.    LABORATORY PANEL:   CBC Recent Labs  Lab 12/19/17 0219  WBC 6.9  HGB 10.4*  HCT 31.2*  PLT 208   ------------------------------------------------------------------------------------------------------------------  Chemistries  Recent Labs  Lab 12/18/17 1956 12/19/17 0219  NA 136 135  K 3.5 3.8  CL 96* 95*  CO2 29 31  GLUCOSE 91 72  BUN 11 12  CREATININE 5.74* 6.19*  CALCIUM 9.7 9.6  AST 24  --   ALT 6  --   ALKPHOS 55  --   BILITOT 0.7  --     ------------------------------------------------------------------------------------------------------------------  Cardiac Enzymes Recent Labs  Lab 12/18/17 1956  TROPONINI 0.04*   ------------------------------------------------------------------------------------------------------------------  RADIOLOGY:  Dg Chest 2 View  Result Date: 12/21/2017 CLINICAL DATA:  Elevated lactic acid level EXAM: CHEST - 2 VIEW COMPARISON:  11/19/2017 FINDINGS: Normal heart size and mediastinal contours. There is no edema, consolidation, effusion, or pneumothorax. Extensive atherosclerotic calcification the subclavians. There is venous stenting on the left is patient with dialysis access. IMPRESSION: No evidence of active disease. Electronically Signed   By: Monte Fantasia M.D.   On: 12/21/2017 16:11    EKG:   Orders placed or performed during the hospital encounter of 11/19/17  . EKG 12-Lead  . EKG 12-Lead    ASSESSMENT AND PLAN:   #Sepsis with elevated lactic acid, altered mental status and procalcitonin source unclear at this time We will get blood cultures, urine cultures ordered.  Unable to get urine cultures as the patient could not urinate.  Patient is a dialysis patient and does not make much urine  chest x-ray neg Wound culture of the left leg wound with gram-negative rods and gram-positive rods Wound care consult placed Broad-spectrum IV antibiotics Zosyn and vancomycin pharmacy to dose   #Intractable nausea and vomiting with significant unintentional weight loss EGD on 12/19/2017 has revealed gastroparesis and gastritis Nausea vomiting resolved with IV Reglan and Pepcid Tolerating advanced diet GI is recommending gastric emptying study upon discharge and follow-up with GI in 1 to 2 weeks after discharge   #Severe protein calorie malnutrition secondary to acute illness of intractable nausea and vomiting for 2 weeks Seen by dietitian  #End-stage  renal disease on hemodialysis  continue hemodialysis per nephrology Dr. Candiss Norse is following Uric acid levels are below normal range.  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   Vascular surgery consult placed seen by Dr. Delana Meyer.  Recent MRI looks fine and no surgical interventions at this time.     All the records are reviewed and case discussed with Care Management/Social Workerr. Management plans discussed with the patient, family and they are in agreement.  CODE STATUS:   TOTAL TIME TAKING CARE OF THIS PATIENT: 36  minutes.   POSSIBLE D/C IN 2  DAYS, DEPENDING ON CLINICAL CONDITION.  Note: This dictation was prepared with Dragon dictation along with smaller phrase technology. Any transcriptional errors that result from this process are unintentional.   Nicholes Mango M.D on 12/22/2017 at 1:46 PM  Between 7am to 6pm - Pager - [phone] After 6pm go to [email or web address] - password [email or web address] [facility]  Office  [phone]  CC: Primary care physician; [name], MD

## 2017-12-22 NOTE — Consult Note (Signed)
Pharmacy Antibiotic Note  Johnny [name]. is a 67 y.o. male admitted on 12/18/2017 with Intractable nausea and vomiting with significant unintentional weight loss. Patient now diagnosed with Sepsis (elvated lactic acid, AMS, and PCT 4.51).   Pharmacy has been consulted for Vancomycin and Zosyn dosing. Patient had PMH of ESRD and received HD on MWF.   Plan: Will order Vancomycin 1500mg  IV x 1 dose, and Vancomycin 750mg  IV with every HD session. Target Pre-HD level 15-25 mcg/mL. Level ordered prior to 3rd HD level.   Start Zosyn 3.375 IV EI every 12 hours.     Height: 5\' 8"  (172.7 cm) Weight: 161 lb 2.5 oz (73.1 kg) IBW/kg (Calculated) : 68.4  Temp (24hrs), Avg:98.3 F (36.8 C), Min:98 F (36.7 C), Max:98.7 F (37.1 C)  Recent Labs  Lab 12/18/17 1904 12/18/17 1956 12/19/17 0219 12/21/17 1403 12/22/17 0943  WBC 7.8  --  6.9  --   --   CREATININE  --  5.74* 6.19*  --   --   LATICACIDVEN  --   --   --  2.1* 1.0    Estimated Creatinine Clearance: 10.9 mL/min (A) (by C-G formula based on SCr of 6.19 mg/dL (H)).    Allergies  Allergen Reactions  . Shellfish Allergy Anaphylaxis    Antimicrobials this admission: 8/15 Zosyn  >>  8/15 Vancomycin >>   Microbiology results: 8/15 BCx: Pending  8/15 UCx: Pending  8/15 Wound cx= GNR, GPR CXR neg.  Thank you for allowing pharmacy to be a part of this patient's care.  Noralee Space, PharmD, BCPS Clinical Pharmacist 12/22/2017 12:57 PM

## 2017-12-22 NOTE — Consult Note (Signed)
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  Unclear etiology.  Wound type:unclear Pressure Injury POA: NA Measurement: 1.4 cm x 2 cmx 0.3 cm foot Plantar foot at thrid toe: 0.5 cm  nonintact lesion Wound TGA:IDKS adherent fibrin slough.  Most wipes away with cleansing Drainage (amount, consistency, odor) minimal serosanguinous  Periwound:intact   Dry skin to lower legs Dressing procedure/placement/frequency:Cleanse wound to left lateral foot and plantar foot at t oes with NS.  Apply NS Moist Aquacel Ag to wound bed. Will not follow at this time.  Please re-consult if needed.  Domenic Moras RN BSN [place] Pager [phone]

## 2017-12-22 NOTE — Clinical Social Work Note (Addendum)
CSW spoke with Tammy at [facility] and they are still waiting on re auth for patient. She stated that they stated they may get it over the weekend and they will call her. If patient does not get re auth over weekend then patient cannot return. Re Josem Kaufmann has to be received. Shela Leff MSW,LCSW [phone]

## 2017-12-22 NOTE — Progress Notes (Signed)
This note also relates to the following rows which could not be included: Pulse Rate - Cannot attach notes to unvalidated device data Resp - Cannot attach notes to unvalidated device data  Hd completed  

## 2017-12-22 NOTE — Progress Notes (Signed)
Hd started  

## 2017-12-22 NOTE — Progress Notes (Signed)
[place], [place] 12/22/17  Subjective:   Patient known to our practice from outpatient dialysis. This time he is admitted for ongoing nausea and vomiting CT scan of the abdomen done at admission shows severe vascular disease but no hemodynamically significant stenosis  He underwent EGD this admission.  Found to have gastroparesis and pill induced gastritis Discharge held yesterday. Wound culture with some growth.  States he ate breakfast. No nausea or vomiting   Objective:  Vital signs in last 24 hours:  Temp:  [98 F (36.7 C)-98.7 F (37.1 C)] 98 F (36.7 C) (08/16 0434) Pulse Rate:  [89-98] 89 (08/16 1009) Resp:  [19-20] 20 (08/16 0434) BP: (126-174)/(70-88) 174/77 (08/16 1009) SpO2:  [99 %-100 %] 100 % (08/16 0434)  Weight change:  Filed Weights   12/19/17 1451 12/20/17 1627 12/20/17 2011  Weight: 70 kg 73.2 kg 73.1 kg    Intake/Output:    Intake/Output Summary (Last 24 hours) at 12/22/2017 1043 Last data filed at 12/21/2017 1602 Gross per 24 hour  Intake -  Output 0 ml  Net 0 ml     Physical Exam: General:  No acute distress, laying in the bed  HEENT  anicteric, moist oral mucous membranes  Neck  supple  Pulm/lungs  normal breathing effort, clear to auscultation,   CVS/Heart  regular, no rub  Abdomen:   Soft, mild midepigastric tenderness  Extremities:  Left leg bandaged, right trace edema  Neurologic:  Alert, able to answer questions appropriately  Access:  AV fistula       Basic Metabolic Panel:  Recent Labs  Lab 12/18/17 1956 12/19/17 0219  NA 136 135  K 3.5 3.8  CL 96* 95*  CO2 29 31  GLUCOSE 91 72  BUN 11 12  CREATININE 5.74* 6.19*  CALCIUM 9.7 9.6  PHOS  --  4.6     CBC: Recent Labs  Lab 12/18/17 1904 12/19/17 0219  WBC 7.8 6.9  NEUTROABS 5.9  --   HGB 11.0* 10.4*  HCT 33.4* 31.2*  MCV 97.1 97.1  PLT 217 208      Lab Results  Component Value Date   HEPBSAG Negative 09/30/2015       Microbiology:  Recent Results (from the past 240 hour(s))  Aerobic Culture (superficial specimen)     Status: None (Preliminary result)   Collection Time: 12/21/17  5:15 PM  Result Value Ref Range Status   Specimen Description   Final    WOUND Performed at [facility], [address] [place], [place] [address]    Special Requests   Final    Immunocompromised Performed at [facility], [address] [place], [place] [address]    Gram Stain   Final    FEW WBC PRESENT, PREDOMINANTLY PMN FEW GRAM NEGATIVE RODS FEW GRAM POSITIVE RODS Performed at [facility], [place] [address] [place], [place] [address]    Culture PENDING  Incomplete   Report Status PENDING  Incomplete    Coagulation Studies: No results for input(s): LABPROT, INR in the last 72 hours.  Urinalysis: No results for input(s): COLORURINE, LABSPEC, PHURINE, GLUCOSEU, HGBUR, BILIRUBINUR, KETONESUR, PROTEINUR, UROBILINOGEN, NITRITE, LEUKOCYTESUR in the last 72 hours.  Invalid input(s): APPERANCEUR    Imaging: Dg Chest 2 View  Result Date: 12/21/2017 CLINICAL DATA:  Elevated lactic acid level EXAM: CHEST - 2 VIEW COMPARISON:  11/19/2017 FINDINGS: Normal heart size and mediastinal contours. There is no edema, consolidation, effusion, or pneumothorax. Extensive atherosclerotic  calcification the subclavians. There is venous stenting on the left is patient with dialysis access. IMPRESSION: No evidence of active disease. Electronically Signed   By: Monte Fantasia M.D.   On: 12/21/2017 16:11     Medications:   . piperacillin-tazobactam (ZOSYN)  IV 3.375 g (12/22/17 1029)  . vancomycin     . acetaminophen  650 mg Oral Q6H  . allopurinol  100 mg Oral Daily  . amLODipine  10 mg Oral Daily  . atorvastatin  10 mg Oral Daily  . Chlorhexidine Gluconate Cloth  6 each Topical Q0600  . clonazepam  0.25 mg Oral QHS  . clopidogrel  75 mg Oral Daily  . famotidine  20 mg Oral Daily  .  feeding supplement (NEPRO CARB STEADY)  237 mL Oral BID BM  . furosemide  40 mg Oral Daily  . gabapentin  200 mg Oral BID  . heparin  5,000 Units Subcutaneous Q8H  . hydrALAZINE  25 mg Oral Daily  . irbesartan  300 mg Oral Daily  . multivitamin  1 tablet Oral QHS  . multivitamin-lutein  1 capsule Oral Daily   haloperidol lactate, metoCLOPramide (REGLAN) injection, morphine injection, ondansetron **OR** ondansetron (ZOFRAN) IV, promethazine, traMADol  Assessment/ Plan:  67 y.o. African-American male ESRD, cardiomyopathy, hypertension, anemia of ckd, shpth, e. coli bacteremia 9/12, left hand inflammatory arthritis resolved, angioplasty cephalic vein 5/59, access declot 9/15. colonoscopy 02/2015, admission for chest pain 11/18/17.  [facility]/ [facility]/MWF/LUE AVG/EDW kg  1.  Nausea and vomiting 2.  End-stage renal disease 3.  Secondary hyperparathyroidism 4.  Anemia chronic kidney disease 5.  Peripheral vascular disease 6.  Gastroparesis and gastritis.  EGD 12/19/2017  Plan: Dialysis today Trial of Nepro Hold sevelamer, calcium acetate and cinacalcet until nausea and vomiting is resolved.  We will reevaluate outpatient and restart binders one at a time epo with HD Wound management as per hospitalist and vascular team.     LOS: 0 Delisa Finck 8/16/201910:43 AM  [place], [place]  Note: This note was prepared with Dragon dictation. Any transcription errors are unintentional

## 2017-12-23 ENCOUNTER — Inpatient Hospital Stay: Payer: Medicare Other

## 2017-12-23 LAB — CBC
HCT: 27.3 % — ABNORMAL LOW (ref 40.0–52.0)
HEMOGLOBIN: 9.2 g/dL — AB (ref 13.0–18.0)
MCH: 32.4 pg (ref 26.0–34.0)
MCHC: 33.7 g/dL (ref 32.0–36.0)
MCV: 96 fL (ref 80.0–100.0)
Platelets: 176 10*3/uL (ref 150–440)
RBC: 2.84 MIL/uL — ABNORMAL LOW (ref 4.40–5.90)
RDW: 17 % — ABNORMAL HIGH (ref 11.5–14.5)
WBC: 7.1 10*3/uL (ref 3.8–10.6)

## 2017-12-23 LAB — PROCALCITONIN
Procalcitonin: 3.65 ng/mL
Procalcitonin: 3.94 ng/mL

## 2017-12-23 LAB — LACTIC ACID, PLASMA
LACTIC ACID, VENOUS: 1.2 mmol/L (ref 0.5–1.9)
Lactic Acid, Venous: 2.2 mmol/L (ref 0.5–1.9)

## 2017-12-23 LAB — BASIC METABOLIC PANEL
Anion gap: 10 (ref 5–15)
BUN: 15 mg/dL (ref 8–23)
CALCIUM: 9.4 mg/dL (ref 8.9–10.3)
CHLORIDE: 102 mmol/L (ref 98–111)
CO2: 30 mmol/L (ref 22–32)
CREATININE: 5.1 mg/dL — AB (ref 0.61–1.24)
GFR calc non Af Amer: 10 mL/min — ABNORMAL LOW (ref >60)
GFR, EST AFRICAN AMERICAN: 12 mL/min — AB (ref >60)
Glucose, Bld: 77 mg/dL (ref 70–99)
Potassium: 3.6 mmol/L (ref 3.5–5.1)
Sodium: 142 mmol/L (ref 135–145)

## 2017-12-23 NOTE — Clinical Social Work Note (Signed)
The CSW contacted Johnny [name], admissions coordinator from [facility], who confirmed that the [facility] authorization has been approved. The patient can discharge to [facility] when he is medically stable. CSW will continue to follow for discharge facilitation.  Johnny [name], MSW, Latanya Presser [phone]

## 2017-12-23 NOTE — Progress Notes (Signed)
[place], [place] 12/23/17  Subjective:   Patient known to our practice from outpatient dialysis. This time he is admitted for ongoing nausea and vomiting CT scan of the abdomen done at admission shows severe vascular disease but no hemodynamically significant stenosis  He underwent EGD this admission.  Found to have gastroparesis and pill induced gastritis Wound culture with some growth. Now has a fever  No nausea or vomiting reported  Objective:  Vital signs in last 24 hours:  Temp:  [97.4 F (36.3 C)-100.9 F (38.3 C)] 97.4 F (36.3 C) (08/17 0507) Pulse Rate:  [83-96] 84 (08/17 0507) Resp:  [12-20] 20 (08/17 0507) BP: (123-171)/(62-87) 161/62 (08/17 0507) SpO2:  [100 %] 100 % (08/17 0507)  Weight change:  Filed Weights   12/19/17 1451 12/20/17 1627 12/20/17 2011  Weight: 70 kg 73.2 kg 73.1 kg    Intake/Output:    Intake/Output Summary (Last 24 hours) at 12/23/2017 1134 Last data filed at 12/22/2017 1830 Gross per 24 hour  Intake 320 ml  Output 500 ml  Net -180 ml     Physical Exam: General:  No acute distress, laying in the bed  HEENT  anicteric, moist oral mucous membranes  Neck  supple  Pulm/lungs  normal breathing effort, clear to auscultation,   CVS/Heart  regular, no rub  Abdomen:   Soft, mild midepigastric tenderness  Extremities:  Left leg bandaged, right trace edema  Neurologic:  Alert, able to answer questions appropriately  Access:  AV fistula       Basic Metabolic Panel:  Recent Labs  Lab 12/18/17 1956 12/19/17 0219 12/23/17 0341  NA 136 135 142  K 3.5 3.8 3.6  CL 96* 95* 102  CO2 29 31 30   GLUCOSE 91 72 77  BUN 11 12 15   CREATININE 5.74* 6.19* 5.10*  CALCIUM 9.7 9.6 9.4  PHOS  --  4.6  --      CBC: Recent Labs  Lab 12/18/17 1904 12/19/17 0219 12/23/17 0341  WBC 7.8 6.9 7.1  NEUTROABS 5.9  --   --   HGB 11.0* 10.4* 9.2*  HCT 33.4* 31.2* 27.3*  MCV 97.1 97.1 96.0  PLT 217 208 176      Lab  Results  Component Value Date   HEPBSAG Negative 09/30/2015      Microbiology:  Recent Results (from the past 240 hour(s))  Aerobic Culture (superficial specimen)     Status: None (Preliminary result)   Collection Time: 12/21/17  5:15 PM  Result Value Ref Range Status   Specimen Description   Final    WOUND Performed at [facility], [address] [place], [place] [address]    Special Requests   Final    Immunocompromised Performed at [facility], [address] [place], [place] [address]    Gram Stain   Final    FEW WBC PRESENT, PREDOMINANTLY PMN FEW GRAM NEGATIVE RODS FEW GRAM POSITIVE RODS    Culture   Final    CULTURE REINCUBATED FOR BETTER GROWTH Performed at [facility], [place] [address] [place], [place] [address]    Report Status PENDING  Incomplete  CULTURE, BLOOD (ROUTINE X 2) w Reflex to ID Panel     Status: None (Preliminary result)   Collection Time: 12/22/17  4:07 AM  Result Value Ref Range Status   Specimen Description BLOOD BLOOD RIGHT WRIST  Final   Special Requests   Final    BOTTLES DRAWN AEROBIC AND  ANAEROBIC Blood Culture adequate volume   Culture   Final    NO GROWTH 1 DAY Performed at [facility], [address] [place], [place] [address]    Report Status PENDING  Incomplete  CULTURE, BLOOD (ROUTINE X 2) w Reflex to ID Panel     Status: None (Preliminary result)   Collection Time: 12/22/17  4:15 AM  Result Value Ref Range Status   Specimen Description BLOOD BLOOD RIGHT HAND  Final   Special Requests   Final    BOTTLES DRAWN AEROBIC AND ANAEROBIC Blood Culture results may not be optimal due to an excessive volume of blood received in culture bottles   Culture   Final    NO GROWTH 1 DAY Performed at [facility], [address], [place], [place] [address]    Report Status PENDING  Incomplete    Coagulation Studies: No results for input(s): LABPROT, INR in the last 72  hours.  Urinalysis: No results for input(s): COLORURINE, LABSPEC, PHURINE, GLUCOSEU, HGBUR, BILIRUBINUR, KETONESUR, PROTEINUR, UROBILINOGEN, NITRITE, LEUKOCYTESUR in the last 72 hours.  Invalid input(s): APPERANCEUR    Imaging: Dg Chest 2 View  Result Date: 12/21/2017 CLINICAL DATA:  Elevated lactic acid level EXAM: CHEST - 2 VIEW COMPARISON:  11/19/2017 FINDINGS: Normal heart size and mediastinal contours. There is no edema, consolidation, effusion, or pneumothorax. Extensive atherosclerotic calcification the subclavians. There is venous stenting on the left is patient with dialysis access. IMPRESSION: No evidence of active disease. Electronically Signed   By: Monte Fantasia M.D.   On: 12/21/2017 16:11     Medications:   . piperacillin-tazobactam (ZOSYN)  IV 3.375 g (12/23/17 1021)  . vancomycin Stopped (12/22/17 1418)   . acetaminophen  650 mg Oral Q6H  . allopurinol  100 mg Oral Daily  . amLODipine  10 mg Oral Daily  . atorvastatin  10 mg Oral Daily  . bisacodyl  10 mg Oral Daily  . Chlorhexidine Gluconate Cloth  6 each Topical Q0600  . clonazepam  0.25 mg Oral QHS  . clopidogrel  75 mg Oral Daily  . famotidine  20 mg Oral Daily  . feeding supplement (NEPRO CARB STEADY)  237 mL Oral BID BM  . furosemide  40 mg Oral Daily  . gabapentin  200 mg Oral BID  . heparin  5,000 Units Subcutaneous Q8H  . hydrALAZINE  25 mg Oral Daily  . irbesartan  300 mg Oral Daily  . multivitamin  1 tablet Oral QHS  . multivitamin-lutein  1 capsule Oral Daily  . pentafluoroprop-tetrafluoroeth   Topical Once  . senna-docusate  1 tablet Oral BID   haloperidol lactate, metoCLOPramide (REGLAN) injection, morphine injection, ondansetron **OR** ondansetron (ZOFRAN) IV, promethazine, traMADol  Assessment/ Plan:  67 y.o. African-American male ESRD, cardiomyopathy, hypertension, anemia of ckd, shpth, e. coli bacteremia 9/12, left hand inflammatory arthritis resolved, angioplasty cephalic vein 0/24,  access declot 9/15. colonoscopy 02/2015, admission for chest pain 11/18/17.  [facility]/MWF/LUE AVG/EDW kg  1.  End-stage renal disease 2.  Nausea and vomiting 3.  Secondary hyperparathyroidism 4.  Anemia chronic kidney disease 5.  Peripheral vascular disease 6.  Gastroparesis and gastritis.  EGD 12/19/2017 7.  Fever, wound infection  Plan: Next HD on monday Trial of Nepro Hold sevelamer, calcium acetate and cinacalcet until nausea and vomiting is resolved.  We will reevaluate outpatient and restart binders one at a time epo with HD Wound management as per hospitalist and vascular team.     LOS:  [place] 8/17/201911:34 [place], [place]  Note: This note was prepared with Dragon dictation. Any transcription errors are unintentional

## 2017-12-23 NOTE — Progress Notes (Signed)
[place] at [place] NAME: Johnny [name]    MR#:  [id number]  DATE OF BIRTH:  [birth year]  SUBJECTIVE:  CHIEF COMPLAINT: Patient is pleasantly confused today but answers few questions.  Febrile last night while on Zosyn and vancomycin REVIEW OF SYSTEMS:  Review of system unobtainable as the patient is confused DRUG ALLERGIES:   Allergies  Allergen Reactions  . Shellfish Allergy Anaphylaxis    VITALS:  Blood pressure (!) 161/62, pulse 84, temperature (!) 97.4 F (36.3 C), temperature source Oral, resp. rate 20, height 5\' 8"  (1.727 m), weight 73.1 kg, SpO2 100 %.  PHYSICAL EXAMINATION:  GENERAL:  67 y.o.-year-old patient lying in the bed with no acute distress.  EYES: Pupils equal, round, reactive to light and accommodation. No scleral icterus. Extraocular muscles intact.  HEENT: Head atraumatic, normocephalic. Oropharynx and nasopharynx clear.  NECK:  Supple, no jugular venous distention. No thyroid enlargement, no tenderness.  LUNGS: Normal breath sounds bilaterally, no wheezing, rales,rhonchi or crepitation. No use of accessory muscles of respiration.  CARDIOVASCULAR: S1, S2 normal. No murmurs, rubs, or gallops.  ABDOMEN: Soft, epigastric tenderness is present, no rebound tenderness, nondistended. Bowel sounds present. EXTREMITIES: No pedal edema, cyanosis, or clubbing.  NEUROLOGIC: ,Sensation intact. Gait not checked.  PSYCHIATRIC: The patient is alert and oriented x1-2 SKIN: No obvious rash, lesion, or ulcer.    LABORATORY PANEL:   CBC Recent Labs  Lab 12/23/17 0341  WBC 7.1  HGB 9.2*  HCT 27.3*  PLT 176   ------------------------------------------------------------------------------------------------------------------  Chemistries  Recent Labs  Lab 12/18/17 1956  12/23/17 0341  NA 136   < > 142  K 3.5   < > 3.6  CL 96*   < > 102  CO2 29   < > 30  GLUCOSE 91   < > 77  BUN 11   < > 15  CREATININE 5.74*   <  > 5.10*  CALCIUM 9.7   < > 9.4  AST 24  --   --   ALT 6  --   --   ALKPHOS 55  --   --   BILITOT 0.7  --   --    < > = values in this interval not displayed.   ------------------------------------------------------------------------------------------------------------------  Cardiac Enzymes Recent Labs  Lab 12/18/17 1956  TROPONINI 0.04*   ------------------------------------------------------------------------------------------------------------------  RADIOLOGY:  Dg Chest 2 View  Result Date: 12/21/2017 CLINICAL DATA:  Elevated lactic acid level EXAM: CHEST - 2 VIEW COMPARISON:  11/19/2017 FINDINGS: Normal heart size and mediastinal contours. There is no edema, consolidation, effusion, or pneumothorax. Extensive atherosclerotic calcification the subclavians. There is venous stenting on the left is patient with dialysis access. IMPRESSION: No evidence of active disease. Electronically Signed   By: Monte Fantasia M.D.   On: 12/21/2017 16:11    EKG:   Orders placed or performed during the hospital encounter of 11/19/17  . EKG 12-Lead  . EKG 12-Lead    ASSESSMENT AND PLAN:   #Sepsis with elevated lactic acid, altered mental status and procalcitonin source unclear at this time Blood cultures are negative so far.  Urine culture cannot be obtained as the patient is a dialysis patient and makes very little urine Repeat chest x-ray Wound culture of the left leg wound with gram-positive and gram-negative rods Wound care consult Broad-spectrum IV antibiotics Zosyn and vancomycin pharmacy to dose ID consult is placed as the patient is febrile on IV Zosyn and vancomycin   #  Intractable nausea and vomiting with significant unintentional weight loss EGD on 12/19/2017 has revealed gastroparesis and gastritis Nausea vomiting resolved with IV Reglan and Pepcid Tolerating advanced diet GI is recommending gastric emptying study upon discharge and follow-up with GI in 1 to 2 weeks after  discharge   #Severe protein calorie malnutrition secondary to acute illness of intractable nausea and vomiting for 2 weeks Seen by dietitian-dietary supplements  #End-stage renal disease on hemodialysis continue hemodialysis per nephrology Dr. Candiss Norse is following Today morning patient is confused and dialysis is not done but patient is awake alert and oriented x3 during my examination  #Chronic diastolic congestive heart failure not volume overloaded at this time  resume home medications once patient tolerates diet  #Chronic peripheral vascular disease with venous ulcers Patient is reporting difficulty with ambulation.   Vascular surgery consult placed seen by Dr. Delana Meyer.  Recent MRI looks fine and no surgical interventions at this time.     All the records are reviewed and case discussed with Care Management/Social Workerr. Management plans discussed with the patient, family and they are in agreement.  CODE STATUS:   TOTAL TIME TAKING CARE OF THIS PATIENT: 36  minutes.   POSSIBLE D/C IN 2  DAYS, DEPENDING ON CLINICAL CONDITION.  Note: This dictation was prepared with Dragon dictation along with smaller phrase technology. Any transcriptional errors that result from this process are unintentional.   Nicholes Mango M.D on 12/23/2017 at 69:04 PM  Between 7am to 6pm - Pager - [phone] After 6pm go to [email or web address] - password [email or web address]  Office  [phone]  CC: Primary care physician; [name], MD

## 2017-12-24 DIAGNOSIS — I739 Peripheral vascular disease, unspecified: Secondary | ICD-10-CM

## 2017-12-24 DIAGNOSIS — R634 Abnormal weight loss: Secondary | ICD-10-CM

## 2017-12-24 DIAGNOSIS — B353 Tinea pedis: Secondary | ICD-10-CM

## 2017-12-24 DIAGNOSIS — Z87891 Personal history of nicotine dependence: Secondary | ICD-10-CM

## 2017-12-24 DIAGNOSIS — N186 End stage renal disease: Secondary | ICD-10-CM

## 2017-12-24 DIAGNOSIS — I700 Atherosclerosis of aorta: Secondary | ICD-10-CM

## 2017-12-24 DIAGNOSIS — R1033 Periumbilical pain: Secondary | ICD-10-CM

## 2017-12-24 DIAGNOSIS — R42 Dizziness and giddiness: Secondary | ICD-10-CM

## 2017-12-24 DIAGNOSIS — K3184 Gastroparesis: Secondary | ICD-10-CM

## 2017-12-24 DIAGNOSIS — L97529 Non-pressure chronic ulcer of other part of left foot with unspecified severity: Secondary | ICD-10-CM

## 2017-12-24 DIAGNOSIS — K4020 Bilateral inguinal hernia, without obstruction or gangrene, not specified as recurrent: Secondary | ICD-10-CM

## 2017-12-24 DIAGNOSIS — I5032 Chronic diastolic (congestive) heart failure: Secondary | ICD-10-CM

## 2017-12-24 DIAGNOSIS — R509 Fever, unspecified: Secondary | ICD-10-CM

## 2017-12-24 DIAGNOSIS — K2970 Gastritis, unspecified, without bleeding: Secondary | ICD-10-CM

## 2017-12-24 DIAGNOSIS — K5900 Constipation, unspecified: Secondary | ICD-10-CM

## 2017-12-24 DIAGNOSIS — R112 Nausea with vomiting, unspecified: Secondary | ICD-10-CM

## 2017-12-24 DIAGNOSIS — Z992 Dependence on renal dialysis: Secondary | ICD-10-CM

## 2017-12-24 DIAGNOSIS — Z955 Presence of coronary angioplasty implant and graft: Secondary | ICD-10-CM

## 2017-12-24 DIAGNOSIS — Z79899 Other long term (current) drug therapy: Secondary | ICD-10-CM

## 2017-12-24 DIAGNOSIS — R109 Unspecified abdominal pain: Secondary | ICD-10-CM

## 2017-12-24 DIAGNOSIS — R410 Disorientation, unspecified: Secondary | ICD-10-CM

## 2017-12-24 DIAGNOSIS — I2510 Atherosclerotic heart disease of native coronary artery without angina pectoris: Secondary | ICD-10-CM

## 2017-12-24 DIAGNOSIS — R531 Weakness: Secondary | ICD-10-CM

## 2017-12-24 DIAGNOSIS — I132 Hypertensive heart and chronic kidney disease with heart failure and with stage 5 chronic kidney disease, or end stage renal disease: Secondary | ICD-10-CM

## 2017-12-24 LAB — CBC WITH DIFFERENTIAL/PLATELET
Basophils Absolute: 0.1 10*3/uL (ref 0–0.1)
Basophils Relative: 1 %
EOS ABS: 0.3 10*3/uL (ref 0–0.7)
EOS PCT: 4 %
HCT: 26.7 % — ABNORMAL LOW (ref 40.0–52.0)
Hemoglobin: 8.8 g/dL — ABNORMAL LOW (ref 13.0–18.0)
LYMPHS ABS: 1.2 10*3/uL (ref 1.0–3.6)
Lymphocytes Relative: 18 %
MCH: 31.9 pg (ref 26.0–34.0)
MCHC: 33 g/dL (ref 32.0–36.0)
MCV: 96.6 fL (ref 80.0–100.0)
MONO ABS: 0.8 10*3/uL (ref 0.2–1.0)
Monocytes Relative: 11 %
Neutro Abs: 4.4 10*3/uL (ref 1.4–6.5)
Neutrophils Relative %: 66 %
PLATELETS: 185 10*3/uL (ref 150–440)
RBC: 2.76 MIL/uL — AB (ref 4.40–5.90)
RDW: 17.4 % — AB (ref 11.5–14.5)
WBC: 6.7 10*3/uL (ref 3.8–10.6)

## 2017-12-24 LAB — AEROBIC CULTURE  (SUPERFICIAL SPECIMEN)

## 2017-12-24 LAB — AEROBIC CULTURE W GRAM STAIN (SUPERFICIAL SPECIMEN)

## 2017-12-24 LAB — MRSA PCR SCREENING: MRSA BY PCR: POSITIVE — AB

## 2017-12-24 LAB — PROCALCITONIN: Procalcitonin: 4.13 ng/mL

## 2017-12-24 NOTE — Progress Notes (Signed)
[place] at [place] NAME: Johnny [name]    MR#:  [id number]  DATE OF BIRTH:  Apr 23, 1951  SUBJECTIVE:  patient mentation much improved. No fever. Denies any cough.  REVIEW OF SYSTEMS:  Review of system unobtainable as the patient is confused DRUG ALLERGIES:   Allergies  Allergen Reactions  . Shellfish Allergy Anaphylaxis    VITALS:  Blood pressure (!) 174/86, pulse 93, temperature 97.9 F (36.6 C), temperature source Oral, resp. rate 16, height 5\' 8"  (1.727 m), weight 73.1 kg, SpO2 98 %.  PHYSICAL EXAMINATION:  GENERAL:  67 y.o.-year-old patient lying in the bed with no acute distress.  EYES: Pupils equal, round, reactive to light and accommodation. No scleral icterus. Extraocular muscles intact.  HEENT: Head atraumatic, normocephalic. Oropharynx and nasopharynx clear.  NECK:  Supple, no jugular venous distention. No thyroid enlargement, no tenderness.  LUNGS: Normal breath sounds bilaterally, no wheezing, rales,rhonchi or crepitation. No use of accessory muscles of respiration.  CARDIOVASCULAR: S1, S2 normal. No murmurs, rubs, or gallops.  ABDOMEN: Soft, epigastric tenderness is present, no rebound tenderness, nondistended. Bowel sounds present. EXTREMITIES: No pedal edema, cyanosis, or clubbing. Chronic nonhealing ulcers in both lower extremities. No foul smelling discharge. NEUROLOGIC: ,Sensation intact. Gait not checked.  PSYCHIATRIC: The patient is alert and oriented x1-2 SKIN: No obvious rash, lesion, or ulcer.    LABORATORY PANEL:   CBC Recent Labs  Lab 12/24/17 0609  WBC 6.7  HGB 8.8*  HCT 26.7*  PLT 185   ------------------------------------------------------------------------------------------------------------------  Chemistries  Recent Labs  Lab 12/18/17 1956  12/23/17 0341  NA 136   < > 142  K 3.5   < > 3.6  CL 96*   < > 102  CO2 29   < > 30  GLUCOSE 91   < > 77  BUN 11   < > 15  CREATININE  5.74*   < > 5.10*  CALCIUM 9.7   < > 9.4  AST 24  --   --   ALT 6  --   --   ALKPHOS 55  --   --   BILITOT 0.7  --   --    < > = values in this interval not displayed.   ------------------------------------------------------------------------------------------------------------------  Cardiac Enzymes Recent Labs  Lab 12/18/17 1956  TROPONINI 0.04*   ------------------------------------------------------------------------------------------------------------------  RADIOLOGY:  Dg Chest 2 View  Result Date: 12/23/2017 CLINICAL DATA:  Possible sepsis. EXAM: CHEST - 2 VIEW COMPARISON:  12/21/2017 FINDINGS: Enlarged cardiac silhouette.  Mediastinal contours appear intact. Peribronchial airspace consolidation, left more than right lower lobe. No evidence of pneumothorax or pleural effusion. Osseous structures are without acute abnormality. Soft tissues are grossly normal. IMPRESSION: Enlarged cardiac silhouette. Bilateral lower lobe peribronchial airspace consolidation may represent bronchitic changes. Electronically Signed   By: Fidela Salisbury M.D.   On: 12/23/2017 16:13    EKG:   Orders placed or performed during the hospital encounter of 11/19/17  . EKG 12-Lead  . EKG 12-Lead    ASSESSMENT AND PLAN:   #Sepsis with elevated lactic acid, altered mental status and procalcitonin source unclear at this time Blood cultures are negative so far.  Urine culture cannot be obtained as the patient is a dialysis patient and makes very little urine Repeat chest x-ray-- shows bronchitis/bronchopneumonia changes although patient denies any respiratory symptoms Wound culture of the left leg wound with gram-positive and gram-negative rods Wound care consult Broad-spectrum IV antibiotics Zosyn and vancomycin pharmacy  to dose -MRSA PCR pending -will de-escalate antibiotics tomorrow pending culture   #Intractable nausea and vomiting with significant unintentional weight loss EGD on 12/19/2017  has revealed gastroparesis and gastritis Nausea vomiting resolved with IV Reglan and Pepcid Tolerating advanced diet GI is recommending gastric emptying study upon discharge and follow-up with GI in 1 to 2 weeks after discharge   #Severe protein calorie malnutrition secondary to acute illness of intractable nausea and vomiting for 2 weeks Seen by dietitian-dietary supplements  #End-stage renal disease on hemodialysis continue hemodialysis per nephrology Dr. Candiss Norse is following Today morning patient is confused and dialysis is not done but patient is awake alert and oriented x3 during my examination  #Chronic diastolic congestive heart failure not volume overloaded at this time  resume home medications once patient tolerates diet  #Chronic peripheral vascular disease with venous ulcers Patient is reporting difficulty with ambulation.   Vascular surgery consult placed seen by Dr. Delana Meyer.  Recent MRI looks fine and no surgical interventions at this time.   -f/u out pt with Vascular  CSW for d/c planning [facility]--likely on monday  CODE STATUS: Full  TOTAL TIME TAKING CARE OF THIS PATIENT: 30  minutes.   POSSIBLE D/C IN 1-2  DAYS, DEPENDING ON CLINICAL CONDITION.  Note: This dictation was prepared with Dragon dictation along with smaller phrase technology. Any transcriptional errors that result from this process are unintentional.   Fritzi Mandes M.D on 12/24/2017 at 10:52 AM  Between 7am to 6pm - Pager - [phone] After 6pm go to [email or web address] - password [email or web address] [place] [facility]  Office  [phone]  CC: Primary care physician; [name], MD

## 2017-12-24 NOTE — Progress Notes (Signed)
Physical Therapy Treatment Patient Details Name: Johnny [name]. MRN: [id number] DOB: [birth year] Today's Date: 12/24/2017    History of Present Illness Johnny [name]  is a 67 y.o. male who presents with abdominal pain.  Patient states that he has had significant nausea and vomiting for the past 2 weeks.  He had a revascularization procedure in his lower extremities a couple weeks ago with stenting.  Since that time he has had persistent nausea even just with the smell of food or the site of food, and episodes of significant vomiting with bilious emesis and significant retching.  Hospitalist were called for admission and further evaluation. He is currently admitted for sepsis without clear source, intractable nausea/vomiting, and protein malnutrition. PMH includes chronic CHF, PVD with ulcers, HTN, and ESRD on HD.    PT Comments    Pt admitted with above diagnosis. Pt currently with functional limitations due to the deficits listed below (see PT Problem List). No external assistance required for bed mobility. Pt requires a couple attempts initially to transfer but with repeat transfers he performs with greater ease. Steady in standing with UE support on walker. Pt is able to complete a full lap around RN station. VSS throughout ambulation and pt denies DOE. Reciprocal pattern demonstrated and fatigue monitored throughout. No instability noted during ambulation. Pt is safe to return home at discharge with [facility] PT. Pt will benefit from PT services to address deficits in strength, balance, and mobility in order to return to full function at home.    Follow Up Recommendations  Home health PT     Equipment Recommendations  None recommended by PT    Recommendations for Other Services       Precautions / Restrictions Precautions Precautions: Fall Restrictions Weight Bearing Restrictions: No    Mobility  Bed Mobility Overal bed mobility: Independent             General bed mobility  comments: No external assistance required  Transfers Overall transfer level: Needs assistance Equipment used: Rolling walker (2 wheeled) Transfers: Sit to/from Stand Sit to Stand: Min guard         General transfer comment: Pt requires a couple attempts initially but with repeat transfers he performs with more ease. Steady in standing with UE support on walker  Ambulation/Gait Ambulation/Gait assistance: Min guard Gait Distance (Feet): 200 Feet Assistive device: Rolling walker (2 wheeled)       General Gait Details: Pt is able to complete a full lap around RN station. VSS throughout ambulation and pt denies DOE. Reciprocal pattern demonstrated and fatigue monitored throughout. No instability noted during ambulation   Stairs             Wheelchair Mobility    Modified Rankin (Stroke Patients Only)       Balance Overall balance assessment: Needs assistance Sitting-balance support: No upper extremity supported Sitting balance-Leahy Scale: Good     Standing balance support: No upper extremity supported Standing balance-Leahy Scale: Fair Standing balance comment: Able to maintain feet apart/together balance without UE support. Single leg balance is less than 3s on each LE                            Cognition Arousal/Alertness: Awake/alert Behavior During Therapy: WFL for tasks assessed/performed Overall Cognitive Status: Within Functional Limits for tasks assessed  General Comments: Pt does appear somewhat distracted and occasionally slow to respond      Exercises      General Comments        Pertinent Vitals/Pain Pain Assessment: 0-10 Pain Score: 5  Pain Location: R foot Pain Intervention(s): Monitored during session    Home Living Family/patient expects to be discharged to:: Private residence Living Arrangements: Alone Available Help at Discharge: Friend(s);Other (Comment)(Pt states he has a  friend who can help) Type of Home: Apartment Home Access: Elevator(Apartment on third floor)   Home Layout: One level Home Equipment: Cane - single point;Walker - 4 wheels;Shower seat - built in      Prior Function Level of Independence: Independent with assistive device(s)      Comments: Pt reports using single point cane for his ambulation needs. Reports independence with ADLs/IADLs. Reports he does all his own cooking, cleaning, and other ADL's   PT Goals (current goals can now be found in the care plan section) Acute Rehab PT Goals Patient Stated Goal: Return to prior level of function PT Goal Formulation: With patient Time For Goal Achievement: 01/07/18 Potential to Achieve Goals: Good    Frequency    Min 2X/week      PT Plan      Co-evaluation              AM-PAC PT "6 Clicks" Daily Activity  Outcome Measure  Difficulty turning over in bed (including adjusting bedclothes, sheets and blankets)?: None Difficulty moving from lying on back to sitting on the side of the bed? : None Difficulty sitting down on and standing up from a chair with arms (e.g., wheelchair, bedside commode, etc,.)?: A Little Help needed moving to and from a bed to chair (including a wheelchair)?: None Help needed walking in hospital room?: None Help needed climbing 3-5 steps with a railing? : A Little 6 Click Score: 22    End of Session Equipment Utilized During Treatment: Gait belt Activity Tolerance: Patient tolerated treatment well Patient left: in bed;with call bell/phone within reach;with bed alarm set   PT Visit Diagnosis: Unsteadiness on feet (R26.81);Muscle weakness (generalized) (M62.81) Pain - Right/Left: Right Pain - part of body: Ankle and joints of foot     Time: 1210-1220(Split eval: 1411-1425) PT Time Calculation (min) (ACUTE ONLY): 10 min  Charges:  $Gait Training: 8-22 mins                     Lyndel Safe  PT, DPT, GCS    , 12/24/2017, 2:50  PM

## 2017-12-24 NOTE — Consult Note (Signed)
Date of Admission:  12/18/2017                 Reason for Consult: Fever   Referring Provider: Gouru    HPI: Johnny [name]. is a 67 y.o. 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  History obtained from the patient and also chart reviewed.  Patient underwent on 11/16/2017 a procedure on his left leg by vascular surgery.  He had percutaneous transluminal angioplasty of the left popliteal artery, left common femoral artery and also had a stent placement to the left distal SFA and popliteal artery.  [place] [facility]  Since then he was taking a lot of pain medicines for the pain in the leg.  He presented with abdominal pain nausea and vomiting.  He had a CT abdomen which showed severe atherosclerotic disease of the aorta and other arteries.  But there was no other acute abdominal presentation.  He has bilateral inguinal hernias but there was no obstruction.  He was seen by GI and endoscopy was done which showed incomplete gastric emptying.  Also he was being treated for constipation.  Been on 12/21/2017 he was noted to be confused.  Psychiatrist had seen the patient and and there was no acute psychosis noted.  As he was tachycardic with low-grade fever, labs were sent and he had a procalcitonin of 4.51 and lactic acid of 2.1 and it was thought he was having sepsis and he was started on vancomycin and Zosyn.  He has had a chronic left leg wound now for many months.  A culture was sent from the wound as well.  I am asked to see the patient for fever. Patient currently states he is feeling better except for the left leg pain which is constant.  In the hospital he was given morphine, tramadol.  The nausea vomiting and abdominal pain is much better and is able to eat again.   He apparently had lost close to 12 pounds since July.  Past Medical History:  Diagnosis Date  . Anemia   . CHF (congestive heart failure) ([place])   . Chronic kidney disease   . Gout   . Hyperlipidemia   . Hypertension     Past Surgical History:  Procedure Laterality Date  . A/V SHUNTOGRAM Left 06/21/2017   Procedure: A/V SHUNTOGRAM;  Surgeon: Katha Cabal, MD;  Location: [facility];  Service: Cardiovascular;  Laterality: Left;  . AV FISTULA PLACEMENT Left 09/18/2015   Procedure: INSERTION OF ARTERIOVENOUS (AV) GORE-TEX GRAFT ARM ( BRACH/AXILLARY GRAFT W/ INSTANT STICK GRAFT );  Surgeon: Katha Cabal, MD;  Location: [facility] ORS;  Service: Vascular;  Laterality: Left;  . DIALYSIS FISTULA CREATION    . LOWER EXTREMITY ANGIOGRAPHY Left 11/16/2017   Procedure: LOWER EXTREMITY ANGIOGRAPHY;  Surgeon: Algernon Huxley, MD;  Location: [place] [facility];  Service: Cardiovascular;  Laterality: Left;  . PERIPHERAL VASCULAR CATHETERIZATION Left 09/01/2015   Procedure: A/V Shuntogram/Fistulagram;  Surgeon: Katha Cabal, MD;  Location: [place] [facility];  Service: Cardiovascular;  Laterality: Left;  . PERIPHERAL VASCULAR CATHETERIZATION N/A 09/30/2015   Procedure: A/V Shuntogram/Fistulagram with perm cathether removal;  Surgeon: Algernon Huxley, MD;  Location: [place] [facility];  Service: Cardiovascular;  Laterality: N/A;  . PERIPHERAL VASCULAR CATHETERIZATION Left 09/30/2015  Procedure: A/V Shunt Intervention;  Surgeon: Algernon Huxley, MD;  Location: [place] [facility];  Service: Cardiovascular;  Laterality: Left;  . PERIPHERAL VASCULAR CATHETERIZATION Left 12/03/2015   Procedure: Thrombectomy;  Surgeon: Algernon Huxley, MD;  Location: [place] [facility];  Service: Cardiovascular;  Laterality: Left;  . PERIPHERAL VASCULAR CATHETERIZATION Left 01/28/2016   Procedure: Thrombectomy;  Surgeon: Algernon Huxley, MD;  Location: [place] [facility];  Service: Cardiovascular;  Laterality: Left;  .  PERIPHERAL VASCULAR CATHETERIZATION N/A 01/28/2016   Procedure: A/V Shuntogram/Fistulagram;  Surgeon: Algernon Huxley, MD;  Location: [place] [facility] LAB;  Service: Cardiovascular;  Laterality: N/A;   1. Ultrasound guidance for vascular access right femoral artery 2. Catheter placement into left anterior tibial artery and left posterior tibial artery from right femoral approach 3. Aortogram and selective left lower extremity angiogram 4. Percutaneous transluminal angioplasty of left anterior tibial artery with 2 mm diameter by 30 cm length angioplasty balloon 5.  Percutaneous transluminal angioplasty of the left popliteal artery and SFA with 4 mm diameter by 10 cm length Lutonix drug-coated angioplasty balloon a 5 mm diameter by 22 cm length and a 5 mm diameter by 15 cm length Lutonix drug-coated angioplasty balloon             6.  Percutaneous transluminal angioplasty of the left common femoral artery with a 6 mm diameter by 8 cm length Lutonix drug-coated angioplasty balloon             7.  Percutaneous transluminal angioplasty of the left tibioperoneal trunk and posterior tibial artery with 2 inflations with a 2.5 mm diameter by 30 cm length angioplasty balloon             8.  Viabahn stent placement to the left distal SFA and popliteal artery for greater than 50% residual stenosis after angioplasty using a 6 mm diameter by 25 cm length stent 9. StarClose closure device right femoral artery Social History   Tobacco Use  . Smoking status: Former Research scientist (life sciences)  . Smokeless tobacco: Never Used  Substance Use Topics  . Alcohol use: No  . Drug use: No   Family history mother hypertension Sister diabetes mellitus   . acetaminophen  650 mg Oral Q6H  . allopurinol  100 mg Oral Daily  . amLODipine  10 mg Oral Daily  . atorvastatin  10 mg Oral Daily  . bisacodyl  10 mg Oral Daily  . Chlorhexidine Gluconate Cloth  6 each Topical Q0600  .  clonazepam  0.25 mg Oral QHS  . clopidogrel  75 mg Oral Daily  . famotidine  20 mg Oral Daily  . feeding supplement (NEPRO CARB STEADY)  237 mL Oral BID BM  . furosemide  40 mg Oral Daily  . gabapentin  200 mg Oral BID  . heparin  5,000 Units Subcutaneous Q8H  . hydrALAZINE  25 mg Oral Daily  . irbesartan  300 mg Oral Daily  . multivitamin  1 tablet Oral QHS  . multivitamin-lutein  1 capsule Oral Daily  . pentafluoroprop-tetrafluoroeth   Topical Once  . senna-docusate  1 tablet Oral BID      Abtx:  Anti-infectives (From admission, onward)   Start     Dose/Rate Route Frequency Ordered Stop   12/22/17 1200  vancomycin (VANCOCIN) IVPB 750 mg/150 ml premix     750 mg 150 mL/hr over 60 Minutes Intravenous Every M-W-F (Hemodialysis) 12/21/17 1559     12/21/17 1600  piperacillin-tazobactam (ZOSYN) IVPB  3.375 g     3.375 g 12.5 mL/hr over 240 Minutes Intravenous Every 12 hours 12/21/17 1548     12/21/17 1600  vancomycin (VANCOCIN) 1,500 mg in sodium chloride 0.9 % 500 mL IVPB     1,500 mg 250 mL/hr over 120 Minutes Intravenous  Once 12/21/17 1548 12/21/17 2021       Review of Systems: Review of Systems  Constitutional: Positive for malaise/fatigue and weight loss. Negative for chills and fever (he does not experience any fever eventhough a temp of 100.9 was noted in the hospital).  HENT: Negative for congestion and sore throat.   Eyes: Negative for double vision and photophobia.  Respiratory: Negative for cough and shortness of breath.   Cardiovascular: Positive for leg swelling. Negative for chest pain and palpitations.  Gastrointestinal: Positive for abdominal pain, constipation, nausea and vomiting.  Genitourinary: Negative for dysuria (makes very little urine).  Musculoskeletal: Positive for myalgias.  Skin: Negative for itching and rash.  Neurological: Positive for dizziness (confusion intermittently in the hospital) and weakness. Negative for headaches.    Endo/Heme/Allergies: Negative for polydipsia. Does not bruise/bleed easily.    OBJECTIVE: Blood pressure (!) 174/86, pulse 93, temperature 97.9 F (36.6 C), temperature source Oral, resp. rate 16, height 5\' 8"  (1.727 m), weight 73.1 kg, SpO2 98 %.  Physical Exam Constitutional; no distress, well-developed Eyes; full range of eye movements, PERRLA, edematous upper eyelids, prominent bulbar region ENT: Tongue moist  CVS: Q9-U7, 2 x 6 systolic murmur RS: Clear to auscultation no rhonchi or wheezes GI: Soft, no tenderness, bowel sounds present GU: Inguinal area soft, cough impulse present  MSK: Moves all limbs SKIN: Left leg there is ulcer on the lateral aspect of the left foot it measures about 272.5 x 3 cm scored a light pink base, no exudate no erythema The webspaces have superficial ulceration        DP pulse not felt on the left side- rt side ?? Neurologic: Oriented in time, place, person.  Slow to respond but fully oriented, nonfocal, able to stand up and transfer to a chair Psychiatric: Flat affect Lymphatic: No obvious lymphadenopathy   Lab Results CBC    Component Value Date/Time   WBC 6.7 12/24/2017 0609   RBC 2.76 (L) 12/24/2017 0609   HGB 8.8 (L) 12/24/2017 0609   HGB 8.8 (L) 01/17/2014 0502   HCT 26.7 (L) 12/24/2017 0609   HCT 27.9 (L) 01/17/2014 0502   PLT 185 12/24/2017 0609   PLT 212 01/17/2014 0502   MCV 96.6 12/24/2017 0609   MCV 93 01/17/2014 0502   MCH 31.9 12/24/2017 0609   MCHC 33.0 12/24/2017 0609   RDW 17.4 (H) 12/24/2017 0609   RDW 15.4 (H) 01/17/2014 0502   LYMPHSABS 1.2 12/24/2017 0609   LYMPHSABS 1.5 01/17/2014 0502   MONOABS 0.8 12/24/2017 0609   MONOABS 1.2 (H) 01/17/2014 0502   EOSABS 0.3 12/24/2017 0609   EOSABS 0.0 01/17/2014 0502   BASOSABS 0.1 12/24/2017 0609   BASOSABS 0.1 01/17/2014 0502    CMP Latest Ref Rng & Units 12/23/2017 12/19/2017 12/18/2017  Glucose 70 - 99 mg/dL 77 72 91  BUN 8 - 23 mg/dL 15 12 11   Creatinine  0.61 - 1.24 mg/dL 5.10(H) 6.19(H) 5.74(H)  Sodium 135 - 145 mmol/L 142 135 136  Potassium 3.5 - 5.1 mmol/L 3.6 3.8 3.5  Chloride 98 - 111 mmol/L 102 95(L) 96(L)  CO2 22 - 32 mmol/L 30 31 29   Calcium 8.9 - 10.3 mg/dL 9.4  9.6 9.7  Total Protein 6.5 - 8.1 g/dL - - 7.8  Total Bilirubin 0.3 - 1.2 mg/dL - - 0.7  Alkaline Phos 38 - 126 U/L - - 55  AST 15 - 41 U/L - - 24  ALT 0 - 44 U/L - - 6      Microbiology: Recent Results (from the past 240 hour(s))  Aerobic Culture (superficial specimen)     Status: None (Preliminary result)   Collection Time: 12/21/17  5:15 PM  Result Value Ref Range Status   Specimen Description   Final    WOUND Performed at [facility], [address] [place], [place] [address]    Special Requests   Final    Immunocompromised Performed at [facility], [address] [place], [place] [address]    Gram Stain   Final    FEW WBC PRESENT, PREDOMINANTLY PMN FEW GRAM NEGATIVE RODS FEW GRAM POSITIVE RODS    Culture   Final    MODERATE PSEUDOMONAS AERUGINOSA MODERATE ENTEROBACTER SPECIES SUSCEPTIBILITIES TO FOLLOW Performed at [facility], [place] [address], [place], [place] [address]    Report Status PENDING  Incomplete  CULTURE, BLOOD (ROUTINE X 2) w Reflex to ID Panel     Status: None (Preliminary result)   Collection Time: 12/22/17  4:07 AM  Result Value Ref Range Status   Specimen Description BLOOD BLOOD RIGHT WRIST  Final   Special Requests   Final    BOTTLES DRAWN AEROBIC AND ANAEROBIC Blood Culture adequate volume   Culture   Final    NO GROWTH 2 DAYS Performed at [facility], [address], [place], [place] [address]    Report Status PENDING  Incomplete  CULTURE, BLOOD (ROUTINE X 2) w Reflex to ID Panel     Status: None (Preliminary result)   Collection Time: 12/22/17  4:15 AM  Result Value Ref Range Status   Specimen Description BLOOD BLOOD RIGHT HAND  Final   Special Requests   Final    BOTTLES DRAWN  AEROBIC AND ANAEROBIC Blood Culture results may not be optimal due to an excessive volume of blood received in culture bottles   Culture   Final    NO GROWTH 2 DAYS Performed at [facility], [address] [place], [place] [address]    Report Status PENDING  Incomplete    Radiographs and labs were personally reviewed by me.   abdomen 1. 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 2. Possible hemodynamically significant stenosis involving the origin of the RIGHT main renal artery. Please note that there is a small accessory RIGHT UPPER pole renal artery which originates from the aorta just above the main renal artery. 3. Severe BILATERAL iliofemoral atherosclerosis with possible hemodynamically significant stenoses involving both common femoral arteries. 4. 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  NON-VASCULAR  1. No acute abnormalities involving the abdomen or pelvis. 2. 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 3. Large BILATERAL inguinal hernias containing fat. High riding RIGHT testicle versus mass in the RIGHT inguinal canal. Please correlate with physical examination as to the location of the RIGHT Testicle.   Assessment and Plan  67 y.o. 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  History obtained  from the patient and also chart reviewed.  Patient underwent on 11/16/2017 a procedure on his left leg by vascular surgery.  He had percutaneous transluminal angioplasty of the left popliteal artery, left common femoral artery and also had a stent placement to the left distal SFA and popliteal  artery.  Intractable nausea and vomiting and abdominal pain with significant unintentional weight loss: EGD showed gastroparesis and gastritis.  He also has extensive atherosclerotic disease. No acute abdomen.  Would be mesenteric ischemia, tramadol induced gastroparesis?  Confusion/delirium while in the hospital.  Could be related to morphine, tramadol and Klonopin.  Resolved now  Low-grade fever, increase in procalcitonin and very mildly elevated lactate.  Patient clinically does not have any evidence of sepsis.  The left foot ulcer does not look infected.  He does not have pneumonia.  The dialysis site on his left arm which is a graft does not have any tenderness and the blood cultures are negative.  Urine should not be tested in a patient with end-stage renal disease as he does not enough urine to be tested and the sediment falsely indicate a UTI.  procalcitonin is not a reliable marker in end-stage renal disease patient as it is elevated usually in end-stage renal disease. Recommend discontinuing antibiotics and observing patient.  Left foot ulceration secondary to peripheral vascular disease.  Does not look infected.  Colonized with Pseudomonas and Enterobacter.  Does not need antibiotics.  Continue topical care with Aquacel Ag.  Wound care may revisit the patient. He also has athlete's foot between his toes  May benefit from nystatin.  Reconsult wound care please  Severe atherosclerotic vascular disease.  PAD left status post PTCA and stenting.  End-stage renal disease on dialysis  Chronic diastolic CHF /hypertension on Norvasc, Lasix, hydralazine and irbesartan.  Discussed the management with the patient the hospitalist.     Tsosie Billing, MD  12/24/2017, 11:18 AM

## 2017-12-24 NOTE — Progress Notes (Signed)
[place], [place] 12/24/17  Subjective:   Patient known to our practice from outpatient dialysis. This time he is admitted for ongoing nausea and vomiting CT scan of the abdomen done at admission shows severe vascular disease but no hemodynamically significant stenosis  He underwent EGD this admission.  Found to have gastroparesis and pill induced gastritis Wound culture with some growth.  No new fever last 24 hrs No nausea or vomiting reported  Objective:  Vital signs in last 24 hours:  Temp:  [97.5 F (36.4 C)-98.9 F (37.2 C)] 97.9 F (36.6 C) (08/18 0452) Pulse Rate:  [81-97] 93 (08/18 0452) Resp:  [16-18] 16 (08/18 0452) BP: (128-174)/(67-86) 174/86 (08/18 0452) SpO2:  [98 %-100 %] 98 % (08/18 0452)  Weight change:  Filed Weights   12/19/17 1451 12/20/17 1627 12/20/17 2011  Weight: 70 kg 73.2 kg 73.1 kg    Intake/Output:    Intake/Output Summary (Last 24 hours) at 12/24/2017 1147 Last data filed at 12/24/2017 0207 Gross per 24 hour  Intake 983.55 ml  Output -  Net 983.55 ml     Physical Exam: General:  No acute distress, laying in the bed  HEENT  anicteric, moist oral mucous membranes  Neck  supple  Pulm/lungs  normal breathing effort, clear to auscultation,   CVS/Heart  regular, no rub  Abdomen:   Soft, mild midepigastric tenderness  Extremities:  Left leg bandaged, right trace edema  Neurologic:  Alert, able to answer questions appropriately  Access:  AV fistula       Basic Metabolic Panel:  Recent Labs  Lab 12/18/17 1956 12/19/17 0219 12/23/17 0341  NA 136 135 142  K 3.5 3.8 3.6  CL 96* 95* 102  CO2 29 31 30   GLUCOSE 91 72 77  BUN 11 12 15   CREATININE 5.74* 6.19* 5.10*  CALCIUM 9.7 9.6 9.4  PHOS  --  4.6  --      CBC: Recent Labs  Lab 12/18/17 1904 12/19/17 0219 12/23/17 0341 12/24/17 0609  WBC 7.8 6.9 7.1 6.7  NEUTROABS 5.9  --   --  4.4  HGB 11.0* 10.4* 9.2* 8.8*  HCT 33.4* 31.2* 27.3* 26.7*  MCV  97.1 97.1 96.0 96.6  PLT 217 208 176 185      Lab Results  Component Value Date   HEPBSAG Negative 09/30/2015      Microbiology:  Recent Results (from the past 240 hour(s))  Aerobic Culture (superficial specimen)     Status: None (Preliminary result)   Collection Time: 12/21/17  5:15 PM  Result Value Ref Range Status   Specimen Description   Final    WOUND Performed at [facility], [address] [place], [place] [address]    Special Requests   Final    Immunocompromised Performed at [facility], [address] [place], [place] [address]    Gram Stain   Final    FEW WBC PRESENT, PREDOMINANTLY PMN FEW GRAM NEGATIVE RODS FEW GRAM POSITIVE RODS    Culture   Final    MODERATE PSEUDOMONAS AERUGINOSA MODERATE ENTEROBACTER SPECIES SUSCEPTIBILITIES TO FOLLOW Performed at [facility], [place] [address] [place], [place] [address]    Report Status PENDING  Incomplete  CULTURE, BLOOD (ROUTINE X 2) w Reflex to ID Panel     Status: None (Preliminary result)   Collection Time: 12/22/17  4:07 AM  Result Value Ref Range Status   Specimen Description BLOOD BLOOD RIGHT WRIST  Final  Special Requests   Final    BOTTLES DRAWN AEROBIC AND ANAEROBIC Blood Culture adequate volume   Culture   Final    NO GROWTH 2 DAYS Performed at [facility], [address] [place], [place] [address]    Report Status PENDING  Incomplete  CULTURE, BLOOD (ROUTINE X 2) w Reflex to ID Panel     Status: None (Preliminary result)   Collection Time: 12/22/17  4:15 AM  Result Value Ref Range Status   Specimen Description BLOOD BLOOD RIGHT HAND  Final   Special Requests   Final    BOTTLES DRAWN AEROBIC AND ANAEROBIC Blood Culture results may not be optimal due to an excessive volume of blood received in culture bottles   Culture   Final    NO GROWTH 2 DAYS Performed at [facility], [address], [place], [place] [address]    Report Status PENDING   Incomplete    Coagulation Studies: No results for input(s): LABPROT, INR in the last 72 hours.  Urinalysis: No results for input(s): COLORURINE, LABSPEC, PHURINE, GLUCOSEU, HGBUR, BILIRUBINUR, KETONESUR, PROTEINUR, UROBILINOGEN, NITRITE, LEUKOCYTESUR in the last 72 hours.  Invalid input(s): APPERANCEUR    Imaging: Dg Chest 2 View  Result Date: 12/23/2017 CLINICAL DATA:  Possible sepsis. EXAM: CHEST - 2 VIEW COMPARISON:  12/21/2017 FINDINGS: Enlarged cardiac silhouette.  Mediastinal contours appear intact. Peribronchial airspace consolidation, left more than right lower lobe. No evidence of pneumothorax or pleural effusion. Osseous structures are without acute abnormality. Soft tissues are grossly normal. IMPRESSION: Enlarged cardiac silhouette. Bilateral lower lobe peribronchial airspace consolidation may represent bronchitic changes. Electronically Signed   By: Fidela Salisbury M.D.   On: 12/23/2017 16:13     Medications:   . piperacillin-tazobactam (ZOSYN)  IV 3.375 g (12/24/17 1013)  . vancomycin Stopped (12/22/17 1418)   . acetaminophen  650 mg Oral Q6H  . allopurinol  100 mg Oral Daily  . amLODipine  10 mg Oral Daily  . atorvastatin  10 mg Oral Daily  . bisacodyl  10 mg Oral Daily  . Chlorhexidine Gluconate Cloth  6 each Topical Q0600  . clonazepam  0.25 mg Oral QHS  . clopidogrel  75 mg Oral Daily  . famotidine  20 mg Oral Daily  . feeding supplement (NEPRO CARB STEADY)  237 mL Oral BID BM  . furosemide  40 mg Oral Daily  . gabapentin  200 mg Oral BID  . heparin  5,000 Units Subcutaneous Q8H  . hydrALAZINE  25 mg Oral Daily  . irbesartan  300 mg Oral Daily  . multivitamin  1 tablet Oral QHS  . multivitamin-lutein  1 capsule Oral Daily  . pentafluoroprop-tetrafluoroeth   Topical Once  . senna-docusate  1 tablet Oral BID   haloperidol lactate, metoCLOPramide (REGLAN) injection, morphine injection, ondansetron **OR** ondansetron (ZOFRAN) IV, promethazine,  traMADol  Assessment/ Plan:  67 y.o. African-American male ESRD, cardiomyopathy, hypertension, anemia of ckd, shpth, e. coli bacteremia 9/12, left hand inflammatory arthritis resolved, angioplasty cephalic vein 0/08, access declot 9/15. colonoscopy 02/2015, admission for chest pain 11/18/17.  [facility]/ [facility]/MWF/LUE AVG/EDW kg  1.  End-stage renal disease 2.  Nausea and vomiting 3.  Secondary hyperparathyroidism 4.  Anemia chronic kidney disease 5.  Peripheral vascular disease 6.  Gastroparesis and gastritis.  EGD 12/19/2017 7.  Fever, wound infection  Plan: Next HD on monday Trial of Nepro Hold sevelamer, calcium acetate and cinacalcet until nausea and vomiting is resolved.  We will reevaluate outpatient and  restart binders one at a time epo with HD Wound management as per hospitalist and vascular team.     LOS: [place] 8/18/201911:47 [place], [place]  Note: This note was prepared with Dragon dictation. Any transcription errors are unintentional

## 2017-12-25 ENCOUNTER — Encounter: Payer: Self-pay | Admitting: Gastroenterology

## 2017-12-25 LAB — RENAL FUNCTION PANEL
ALBUMIN: 2.9 g/dL — AB (ref 3.5–5.0)
Anion gap: 14 (ref 5–15)
BUN: 31 mg/dL — AB (ref 8–23)
CO2: 27 mmol/L (ref 22–32)
Calcium: 10 mg/dL (ref 8.9–10.3)
Chloride: 101 mmol/L (ref 98–111)
Creatinine, Ser: 9.32 mg/dL — ABNORMAL HIGH (ref 0.61–1.24)
GFR calc Af Amer: 6 mL/min — ABNORMAL LOW (ref >60)
GFR calc non Af Amer: 5 mL/min — ABNORMAL LOW (ref >60)
GLUCOSE: 61 mg/dL — AB (ref 70–99)
PHOSPHORUS: 4.4 mg/dL (ref 2.5–4.6)
POTASSIUM: 4 mmol/L (ref 3.5–5.1)
SODIUM: 142 mmol/L (ref 135–145)

## 2017-12-25 LAB — PROCALCITONIN: Procalcitonin: 4.74 ng/mL

## 2017-12-25 MED ORDER — FAMOTIDINE 20 MG PO TABS: 20.0000 mg | ORAL_TABLET | Freq: Every day | ORAL | 0 refills | Status: DC

## 2017-12-25 MED ORDER — NEPRO/CARBSTEADY PO LIQD: 237.0000 mL | Freq: Two times a day (BID) | ORAL | 0 refills | Status: DC

## 2017-12-25 MED ORDER — OCUVITE-LUTEIN PO CAPS: 1.0000 | ORAL_CAPSULE | Freq: Every day | ORAL | 0 refills | Status: DC

## 2017-12-25 MED ORDER — TRAMADOL HCL 50 MG PO TABS: ORAL_TABLET | ORAL | 0 refills | Status: DC

## 2017-12-25 MED ORDER — EPOETIN ALFA 10000 UNIT/ML IJ SOLN
4000.0000 [IU] | INTRAMUSCULAR | Status: DC
  Administered 2017-12-25: 4000 [IU] via INTRAVENOUS

## 2017-12-25 MED ORDER — RENA-VITE PO TABS: 1.0000 | ORAL_TABLET | Freq: Every day | ORAL | 0 refills | Status: DC

## 2017-12-25 NOTE — Progress Notes (Deleted)
Post HD Treatment    12/25/17 1343  Hand-Off documentation  Report given to (Full Name) Telford Nab, RN  Report received from (Full Name) Stephannie Peters, RN  Vital Signs  Pulse Rate 92  Pulse Rate Source Monitor  Resp 13  BP (!) 156/84  BP Location Right Arm  BP Method Automatic  Patient Position (if appropriate) Lying  Oxygen Therapy  SpO2 100 %  O2 Device Room Air  Post-Hemodialysis Assessment  Rinseback Volume (mL) 250 mL  KECN 79.3 V  Dialyzer Clearance Lightly streaked  Duration of HD Treatment -hour(s) 3.5 hour(s)  Hemodialysis Intake (mL) 500 mL  UF Total -Machine (mL) 2010 mL  Net UF (mL) 1510 mL  Tolerated HD Treatment Yes  Post-Hemodialysis Comments  (Pt awake, speaking with Dialysis Care Manager)  AVG/AVF Arterial Site Held (minutes) 10 minutes  AVG/AVF Venous Site Held (minutes) 10 minutes  Fistula / Graft Left Upper arm  No Placement Date or Time found.   Orientation: Left  Access Location: Upper arm  Site Condition No complications  Fistula / Graft Assessment Present;Thrill;Bruit  Status Deaccessed  Drainage Description None

## 2017-12-25 NOTE — Progress Notes (Signed)
Report called to [facility]. EMS will transport to facility.

## 2017-12-25 NOTE — Clinical Social Work Note (Signed)
[facility] has received reauth and discharge information sent to [facility]. [facility] is aware of discharge after hemodialysis today. Shela Leff MSW,LCSW [phone]

## 2017-12-25 NOTE — Progress Notes (Addendum)
Pre HD Treatment    12/25/17 0917  Vital Signs  Temp 98.5 F (36.9 C)  Temp Source Oral  Pulse Rate 92  Pulse Rate Source Monitor  Resp 13  BP (!) 199/82  BP Location Right Arm  BP Method Automatic  Patient Position (if appropriate) Lying  Oxygen Therapy  SpO2 98 %  O2 Device Room Air  Pain Assessment  Pain Scale 0-10  Pain Score 0  POSS Scale ([place] Opioid Sedation Scale)  POSS *See Group Information* 1-Acceptable,Awake and alert  Dialysis Weight  Weight 70.8 kg  Type of Weight Pre-Dialysis  Time-Out for Hemodialysis  What Procedure? HD  Pt Identifiers(min of two) First/Last Name;MRN/Account#;Pt's DOB(use if MRN/Acct# not available  Correct Site? Yes  Correct Side? Yes  Correct Procedure? Yes  Consents Verified? Yes  Rad Studies Available? N/A  Safety Precautions Reviewed? Yes  Engineer, civil (consulting) Number [phone] (6A)  Station Number 4  UF/Alarm Test Passed  Conductivity: Meter 14  Conductivity: Machine  13.9  pH 7.4  Reverse Osmosis Main  Normal Saline Lot Number [id number]  Dialyzer Lot Number [id number]  Disposable Set Lot Number [id number]  Machine Temperature 98.6 F (37 C)  Musician and Audible Yes  Blood Lines Intact and Secured Yes  Pre Treatment Patient Checks  Vascular access used during treatment Graft  Hepatitis B Surface Antigen Results Negative  Date Hepatitis B Surface Antigen Drawn 04/26/17  Hepatitis B Surface Antibody  (>10)  Date Hepatitis B Surface Antibody Drawn 04/26/17  Hemodialysis Consent Verified Yes  Hemodialysis Standing Orders Initiated Yes  ECG (Telemetry) Monitor On Yes  Prime Ordered Normal Saline  Length of  DialysisTreatment -hour(s) 3.5 Hour(s)  Dialysis Treatment Comments Na 140  Dialyzer Elisio 17H NR  Dialysate 2K, 2.5 Ca  Dialysis Anticoagulant None  Dialysate Flow Ordered 800  Blood Flow Rate Ordered 400 mL/min  Ultrafiltration Goal 1.5 Liters  Pre Treatment Labs Renal panel;Phosphorus  Dialysis Blood  Pressure Support Ordered Normal Saline  Education / Care Plan  Dialysis Education Provided Yes  Documented Education in Care Plan Yes  Fistula / Graft Left Upper arm  No Placement Date or Time found.   Orientation: Left  Access Location: Upper arm  Site Condition No complications  Fistula / Graft Assessment Present;Thrill;Bruit  Drainage Description None

## 2017-12-25 NOTE — Progress Notes (Signed)
Post HD Treatment    12/25/17 1343  Hand-Off documentation  Report given to (Full Name) Telford Nab, RN  Report received from (Full Name) Stephannie Peters, RN  Vital Signs  Temp 98.5 F (36.9 C)  Temp Source Oral  Pulse Rate 92  Pulse Rate Source Monitor  Resp 13  BP (!) 156/84  BP Location Right Arm  BP Method Automatic  Patient Position (if appropriate) Lying  Oxygen Therapy  SpO2 100 %  O2 Device Room Air  Dialysis Weight  Weight 69.7 kg  Type of Weight Post-Dialysis  Post-Hemodialysis Assessment  Rinseback Volume (mL) 250 mL  KECN 79.3 V  Dialyzer Clearance Lightly streaked  Duration of HD Treatment -hour(s) 3.5 hour(s)  Hemodialysis Intake (mL) 500 mL  UF Total -Machine (mL) 2010 mL  Net UF (mL) 1510 mL  Tolerated HD Treatment Yes  Post-Hemodialysis Comments  (Pt awake, speaking with Dialysis Care Manager)  AVG/AVF Arterial Site Held (minutes) 10 minutes  AVG/AVF Venous Site Held (minutes) 10 minutes  Fistula / Graft Left Upper arm  No Placement Date or Time found.   Orientation: Left  Access Location: Upper arm  Site Condition No complications  Fistula / Graft Assessment Present;Thrill;Bruit  Status Deaccessed  Drainage Description None

## 2017-12-25 NOTE — Progress Notes (Signed)
HD Treatment Completed    12/25/17 1325  Vital Signs  Pulse Rate 92  Pulse Rate Source Monitor  Resp 10  BP (!) 177/85  BP Location Right Arm  BP Method Automatic  Patient Position (if appropriate) Lying  Oxygen Therapy  SpO2 100 %  O2 Device Room Air  During Hemodialysis Assessment  Intra-Hemodialysis Comments Tx completed

## 2017-12-25 NOTE — Progress Notes (Signed)
HD Post Assessment    12/25/17 1345  Neurological  Level of Consciousness Alert  Orientation Level Oriented X4  Respiratory  Respiratory Pattern Regular;Unlabored;Symmetrical  Chest Assessment Chest expansion symmetrical  Cardiac  Pulse Regular  Heart Sounds S1, S2  ECG Monitor Yes  Cardiac Rhythm NSR  Vascular  R Radial Pulse +2  L Radial Pulse +2  Edema Generalized  Integumentary  Integumentary (WDL) X  Skin Color Appropriate for ethnicity  Skin Condition Dry;Flaky  Musculoskeletal  Musculoskeletal (WDL) X  Generalized Weakness Yes  Gastrointestinal  Bowel Sounds Assessment Active  GU Assessment  Genitourinary (WDL) X (HD Pt)  Psychosocial  Psychosocial (WDL) WDL

## 2017-12-25 NOTE — Discharge Instructions (Signed)
Resume your HD MWF as before 

## 2017-12-25 NOTE — Discharge Summary (Signed)
[facility] [place] NAME: Johnny [name]    MR#:  [id number]  DATE OF BIRTH:  03-May-1951  DATE OF ADMISSION:  12/18/2017 ADMITTING PHYSICIAN: Nicholes Mango, MD  DATE OF DISCHARGE: 12/25/2017  PRIMARY CARE PHYSICIAN: [name], MD    ADMISSION DIAGNOSIS:  Periumbilical abdominal pain [R10.33] Intractable vomiting with nausea, unspecified vomiting type [R11.2]  DISCHARGE DIAGNOSIS:  *intractable nausea and vomiting resolved-- suspected due to gastroparesis and gastritis * chronic bilateral lower extremity venous ulcers secondary to severe peripheral vascular disease-- follows with vascular surgery * End stage renal disease on hemodialysis SECONDARY DIAGNOSIS:   Past Medical History:  Diagnosis Date  . Anemia   . CHF (congestive heart failure) ([place])   . Chronic kidney disease   . Gout   . Hyperlipidemia   . Hypertension     HOSPITAL COURSE:  # Suspected Sepsis with elevated lactic acid, altered mental status and procalcitonin source unclear at this time--No clinical sepsis so far -Blood cultures are negative so far.  Urine culture cannot be obtained as the patient is a dialysis patient and makes very little urine -Repeat chest x-ray-- shows bronchitis/bronchopneumonia changes although patient denies any respiratory symptoms--no pneumonia clincally -Wound culture of the left leg wound with gram-positive and gram-negative rods--appears chronic and does not look infected. Seen by ID -Wound care consult appreciated. F/u at wound clinic and cont  Care with Cleanse wound to left lateral foot and plantar foot at t oes with NS.  Apply NS Moist Aquacel Ag to wound bed.cover with dry gauze and kerlix/tape. Change daily. -was on Broad-spectrum IV antibiotics Zosyn and vancomycin --discontinued by ID #Intractable nausea and vomiting with significant unintentional weight loss EGD on 12/19/2017 has revealed gastroparesis and  gastritis Nausea vomiting resolved with IV Reglan and Pepcid Tolerating advanced diet GI is recommending gastric emptying study upon discharge and follow-up with GI in 1 to 2 weeks after discharge  #Severe protein calorie malnutrition secondary to acute illness of intractable nausea and vomiting for 2 weeks Seen by dietitian-dietary supplements  #End-stage renal disease on hemodialysis continue hemodialysis per nephrology  -Holding Binders per Dr Keturah Barre recommendation--and will be resumed by  Nephrology one at a time as out pt  #Chronic diastolic congestive heart failure not volume overloaded at this time  resume home medications once patient tolerates diet  #Chronic peripheral vascular disease with venous ulcers  Vascular surgery consult placed seen by Dr. Delana Meyer.  Recent MRI looks fine and no surgical interventions at this time.   -f/u out pt with Vascular  CS[facility] for [facility]--today after HD. Pt agreeable  CONSULTS OBTAINED:  Treatment Team:  Murlean Iba, MD Schnier, Dolores Lory, MD Clapacs, Madie Reno, MD Tsosie Billing, MD Fritzi Mandes, MD  DRUG ALLERGIES:   Allergies  Allergen Reactions  . Shellfish Allergy Anaphylaxis    DISCHARGE MEDICATIONS:   Allergies as of 12/25/2017      Reactions   Shellfish Allergy Anaphylaxis      Medication List    STOP taking these medications   calcium acetate 667 MG capsule Commonly known as:  PHOSLO   cinacalcet 90 MG tablet Commonly known as:  SENSIPAR   ondansetron 4 MG tablet Commonly known as:  ZOFRAN   sevelamer carbonate 800 MG tablet Commonly known as:  RENVELA     TAKE these medications   allopurinol 100 MG tablet Commonly known as:  ZYLOPRIM Take 100 mg by mouth daily.  amLODipine 10 MG tablet Commonly known as:  NORVASC Take 10 mg by mouth daily.   atorvastatin 10 MG tablet Commonly known as:  LIPITOR Take 1 tablet by mouth daily.   Calcium Gluconate 50 MG Tabs Take 50  mg by mouth 3 (three) times daily.   clonazePAM 0.5 MG tablet Commonly known as:  KLONOPIN Take 0.25 mg by mouth at bedtime.   clopidogrel 75 MG tablet Commonly known as:  PLAVIX Take 1 tablet (75 mg total) by mouth daily.   colchicine 0.6 MG tablet Take 0.6 mg by mouth daily as needed (for gout flares).   collagenase ointment Commonly known as:  SANTYL Apply topically daily.   famotidine 20 MG tablet Commonly known as:  PEPCID Take 1 tablet (20 mg total) by mouth daily.   feeding supplement (NEPRO CARB STEADY) Liqd Take 237 mLs by mouth 2 (two) times daily between meals.   furosemide 40 MG tablet Commonly known as:  LASIX Take 40 mg by mouth daily.   gabapentin 100 MG capsule Commonly known as:  NEURONTIN Take 200 mg by mouth 2 (two) times daily.   hydrALAZINE 25 MG tablet Commonly known as:  APRESOLINE Take 25 mg by mouth daily.   irbesartan 300 MG tablet Commonly known as:  AVAPRO Take 300 mg by mouth daily.   lidocaine-prilocaine cream Commonly known as:  EMLA Apply 1 application topically as needed (port access).   multivitamin Tabs tablet Take 1 tablet by mouth at bedtime.   multivitamin-lutein Caps capsule Take 1 capsule by mouth daily.   ondansetron 4 MG disintegrating tablet Commonly known as:  ZOFRAN-ODT Take 1 tablet (4 mg total) by mouth every 8 (eight) hours as needed for nausea or vomiting.   pregabalin 50 MG capsule Commonly known as:  LYRICA Take 50 mg by mouth daily. After each dialysis session   traMADol 50 MG tablet Commonly known as:  ULTRAM One to Two Tabs By Mouth Every 8 Hours As Needed For Pain What changed:  additional instructions       If you experience worsening of your admission symptoms, develop shortness of breath, life threatening emergency, suicidal or homicidal thoughts you must seek medical attention immediately by calling 911 or calling your MD immediately  if symptoms less severe.  You Must read complete  instructions/literature along with all the possible adverse reactions/side effects for all the Medicines you take and that have been prescribed to you. Take any new Medicines after you have completely understood and accept all the possible adverse reactions/side effects.   Please note  You were cared for by a hospitalist during your hospital stay. If you have any questions about your discharge medications or the care you received while you were in the hospital after you are discharged, you can call the unit and asked to speak with the hospitalist on call if the hospitalist that took care of you is not available. Once you are discharged, your primary care physician will handle any further medical issues. Please note that NO REFILLS for any discharge medications will be authorized once you are discharged, as it is imperative that you return to your primary care physician (or establish a relationship with a primary care physician if you do not have one) for your aftercare needs so that they can reassess your need for medications and monitor your lab values. Today   SUBJECTIVE   Overall ok. No fever no vomiting  VITAL SIGNS:  Blood pressure (!) 179/79, pulse 87, temperature 98.5 F (  36.9 C), temperature source Oral, resp. rate 20, height 5\' 8"  (1.727 m), weight 73.1 kg, SpO2 100 %.  I/O:    Intake/Output Summary (Last 24 hours) at 12/25/2017 0849 Last data filed at 12/24/2017 2200 Gross per 24 hour  Intake 240 ml  Output -  Net 240 ml    PHYSICAL EXAMINATION:  GENERAL:  67 y.o.-year-old patient lying in the bed with no acute distress.  EYES: Pupils equal, round, reactive to light and accommodation. No scleral icterus. Extraocular muscles intact.  HEENT: Head atraumatic, normocephalic. Oropharynx and nasopharynx clear.  NECK:  Supple, no jugular venous distention. No thyroid enlargement, no tenderness.  LUNGS: Normal breath sounds bilaterally, no wheezing, rales,rhonchi or crepitation. No use  of accessory muscles of respiration.  CARDIOVASCULAR: S1, S2 normal. No murmurs, rubs, or gallops.  ABDOMEN: Soft, non-tender, non-distended. Bowel sounds present. No organomegaly or mass.  EXTREMITIES: chronic bilateral lower extremity venous ulcer.dressing Present NEUROLOGIC: Cranial nerves II through XII are intact. Muscle strength 5/5 in all extremities. Sensation intact. Gait not checked.  PSYCHIATRIC: The patient is alert and oriented x 3.  SKIN:as above  DATA REVIEW:   CBC  Recent Labs  Lab 12/24/17 0609  WBC 6.7  HGB 8.8*  HCT 26.7*  PLT 185    Chemistries  Recent Labs  Lab 12/18/17 1956  12/23/17 0341  NA 136   < > 142  K 3.5   < > 3.6  CL 96*   < > 102  CO2 29   < > 30  GLUCOSE 91   < > 77  BUN 11   < > 15  CREATININE 5.74*   < > 5.10*  CALCIUM 9.7   < > 9.4  AST 24  --   --   ALT 6  --   --   ALKPHOS 55  --   --   BILITOT 0.7  --   --    < > = values in this interval not displayed.    Microbiology Results   Recent Results (from the past 240 hour(s))  Aerobic Culture (superficial specimen)     Status: None   Collection Time: 12/21/17  5:15 PM  Result Value Ref Range Status   Specimen Description   Final    WOUND Performed at [facility], [address] [place], [place] [address]    Special Requests   Final    Immunocompromised Performed at [facility], [address] [place], [place] [address]    Gram Stain   Final    FEW WBC PRESENT, PREDOMINANTLY PMN FEW GRAM NEGATIVE RODS FEW GRAM POSITIVE RODS Performed at [facility], [place] [address], [place], [place] [address]    Culture   Final    MODERATE PSEUDOMONAS AERUGINOSA MODERATE ENTEROBACTER SPECIES    Report Status 12/24/2017 FINAL  Final   Organism ID, Bacteria PSEUDOMONAS AERUGINOSA  Final   Organism ID, Bacteria ENTEROBACTER SPECIES  Final      Susceptibility   Enterobacter species - MIC*    CEFAZOLIN >=64 RESISTANT Resistant     CEFEPIME <=1 SENSITIVE  Sensitive     CEFTAZIDIME <=1 SENSITIVE Sensitive     CEFTRIAXONE <=1 SENSITIVE Sensitive     CIPROFLOXACIN <=0.25 SENSITIVE Sensitive     GENTAMICIN <=1 SENSITIVE Sensitive     IMIPENEM 0.5 SENSITIVE Sensitive     TRIMETH/SULFA <=20 SENSITIVE Sensitive     PIP/TAZO <=4 SENSITIVE Sensitive     * MODERATE ENTEROBACTER SPECIES  Pseudomonas aeruginosa - MIC*    CEFTAZIDIME <=1 SENSITIVE Sensitive     CIPROFLOXACIN <=0.25 SENSITIVE Sensitive     GENTAMICIN <=1 SENSITIVE Sensitive     IMIPENEM 2 SENSITIVE Sensitive     PIP/TAZO <=4 SENSITIVE Sensitive     CEFEPIME <=1 SENSITIVE Sensitive     * MODERATE PSEUDOMONAS AERUGINOSA  CULTURE, BLOOD (ROUTINE X 2) w Reflex to ID Panel     Status: None (Preliminary result)   Collection Time: 12/22/17  4:07 AM  Result Value Ref Range Status   Specimen Description BLOOD BLOOD RIGHT WRIST  Final   Special Requests   Final    BOTTLES DRAWN AEROBIC AND ANAEROBIC Blood Culture adequate volume   Culture   Final    NO GROWTH 2 DAYS Performed at [facility], [address], [place], [place] [address]    Report Status PENDING  Incomplete  CULTURE, BLOOD (ROUTINE X 2) w Reflex to ID Panel     Status: None (Preliminary result)   Collection Time: 12/22/17  4:15 AM  Result Value Ref Range Status   Specimen Description BLOOD BLOOD RIGHT HAND  Final   Special Requests   Final    BOTTLES DRAWN AEROBIC AND ANAEROBIC Blood Culture results may not be optimal due to an excessive volume of blood received in culture bottles   Culture   Final    NO GROWTH 2 DAYS Performed at [facility], [address] [place], [place] [address]    Report Status PENDING  Incomplete  MRSA PCR Screening     Status: Abnormal   Collection Time: 12/24/17  5:43 PM  Result Value Ref Range Status   MRSA by PCR POSITIVE (A) NEGATIVE Final    Comment:        The GeneXpert MRSA Assay (FDA approved for NASAL specimens only), is one component of a comprehensive  MRSA colonization surveillance program. It is not intended to diagnose MRSA infection nor to guide or monitor treatment for MRSA infections. RESULT CALLED TO, READ BACK BY AND VERIFIED WITH: CHARLOTTE GADDY AT 1936 12/24/17.PMH Performed at [facility], [address] [place], [place] [address]     RADIOLOGY:  Dg Chest 2 View  Result Date: 12/23/2017 CLINICAL DATA:  Possible sepsis. EXAM: CHEST - 2 VIEW COMPARISON:  12/21/2017 FINDINGS: Enlarged cardiac silhouette.  Mediastinal contours appear intact. Peribronchial airspace consolidation, left more than right lower lobe. No evidence of pneumothorax or pleural effusion. Osseous structures are without acute abnormality. Soft tissues are grossly normal. IMPRESSION: Enlarged cardiac silhouette. Bilateral lower lobe peribronchial airspace consolidation may represent bronchitic changes. Electronically Signed   By: Fidela Salisbury M.D.   On: 12/23/2017 16:13     Management plans discussed with the patient, family and they are in agreement.  CODE STATUS:     Code Status Orders  (From admission, onward)         Start     Ordered   12/19/17 0111  Full code  Continuous     12/19/17 0112        Code Status History    Date Active Date Inactive Code Status Order ID Comments User Context   11/27/2017 1831 12/01/2017 2320 Full Code [id number]  Saundra Shelling, MD Inpatient   11/19/2017 0507 11/21/2017 0009 Full Code [id number]  Harrie Foreman, MD Inpatient   11/16/2017 1205 11/16/2017 1717 Full Code [id number]  Algernon Huxley, MD Inpatient   01/28/2016 1050 01/28/2016 1456 Full Code [id number]  Dew,  Erskine Squibb, MD Inpatient   09/28/2015 2306 10/02/2015 1738 Full Code [id number]  Lance Coon, MD Inpatient    Advance Directive Documentation     Most Recent Value  Type of Advance Directive  Healthcare Power of Attorney  Pre-existing out of facility DNR order (yellow form or pink MOST form)  -  "MOST" Form in Place?  -      TOTAL TIME  TAKING CARE OF THIS PATIENT: *40* minutes.    Fritzi Mandes M.D on 12/25/2017 at 8:49 AM  Between 7am to 6pm - Pager - [phone] After 6pm go to [email or web address] - password [email or web address]  Office  [phone]  CC: Primary care physician; [name], MD

## 2017-12-25 NOTE — Progress Notes (Signed)
[facility] [place], [place] 12/25/17  Subjective:  Patient seen and evaluated during hemodialysis. Tolerating well. Blood flow rate 400 with ultrafiltration target of 1.5 kg.   Objective:  Vital signs in last 24 hours:  Temp:  [98.4 F (36.9 C)-98.8 F (37.1 C)] 98.5 F (36.9 C) (08/19 0917) Pulse Rate:  [80-97] 89 (08/19 1200) Resp:  [10-20] 10 (08/19 1200) BP: (133-199)/(68-144) 154/73 (08/19 1200) SpO2:  [97 %-100 %] 99 % (08/19 1200) Weight:  [70.8 kg] 70.8 kg (08/19 0917)  Weight change:  Filed Weights   12/20/17 1627 12/20/17 2011 12/25/17 0917  Weight: 73.2 kg 73.1 kg 70.8 kg    Intake/Output:    Intake/Output Summary (Last 24 hours) at 12/25/2017 1220 Last data filed at 12/24/2017 2200 Gross per 24 hour  Intake 240 ml  Output -  Net 240 ml     Physical Exam: General:  No acute distress, laying in the bed  HEENT  anicteric, moist oral mucous membranes  Neck  supple  Pulm/lungs  normal breathing effort, clear to auscultation  CVS/Heart  regular, no rub  Abdomen:   Soft, NT, BS present  Extremities:  trace LE edema  Neurologic:  resting comfortably  Access:  AV fistula       Basic Metabolic Panel:  Recent Labs  Lab 12/18/17 1956 12/19/17 0219 12/23/17 0341 12/25/17 0607  NA 136 135 142 142  K 3.5 3.8 3.6 4.0  CL 96* 95* 102 101  CO2 29 31 30 27   GLUCOSE 91 72 77 61*  BUN 11 12 15  31*  CREATININE 5.74* 6.19* 5.10* 9.32*  CALCIUM 9.7 9.6 9.4 10.0  PHOS  --  4.6  --  4.4     CBC: Recent Labs  Lab 12/18/17 1904 12/19/17 0219 12/23/17 0341 12/24/17 0609  WBC 7.8 6.9 7.1 6.7  NEUTROABS 5.9  --   --  4.4  HGB 11.0* 10.4* 9.2* 8.8*  HCT 33.4* 31.2* 27.3* 26.7*  MCV 97.1 97.1 96.0 96.6  PLT 217 208 176 185      Lab Results  Component Value Date   HEPBSAG Negative 09/30/2015      Microbiology:  Recent Results (from the past 240 hour(s))  Aerobic Culture (superficial specimen)     Status: None   Collection Time: 12/21/17  5:15 PM  Result Value Ref Range Status   Specimen Description   Final    WOUND Performed at [facility], [address], [place], [place] [address]    Special Requests   Final    Immunocompromised Performed at [facility], [address] [place], [place] [address]    Gram Stain   Final    FEW WBC PRESENT, PREDOMINANTLY PMN FEW GRAM NEGATIVE RODS FEW GRAM POSITIVE RODS Performed at [facility], [place] [address], [place], [place] [address]    Culture   Final    MODERATE PSEUDOMONAS AERUGINOSA MODERATE ENTEROBACTER SPECIES    Report Status 12/24/2017 FINAL  Final   Organism ID, Bacteria PSEUDOMONAS AERUGINOSA  Final   Organism ID, Bacteria ENTEROBACTER SPECIES  Final      Susceptibility   Enterobacter species - MIC*    CEFAZOLIN >=64 RESISTANT Resistant     CEFEPIME <=1 SENSITIVE Sensitive     CEFTAZIDIME <=1 SENSITIVE Sensitive     CEFTRIAXONE <=1 SENSITIVE Sensitive     CIPROFLOXACIN <=0.25 SENSITIVE Sensitive     GENTAMICIN <=1 SENSITIVE Sensitive     IMIPENEM 0.5 SENSITIVE Sensitive  TRIMETH/SULFA <=20 SENSITIVE Sensitive     PIP/TAZO <=4 SENSITIVE Sensitive     * MODERATE ENTEROBACTER SPECIES   Pseudomonas aeruginosa - MIC*    CEFTAZIDIME <=1 SENSITIVE Sensitive     CIPROFLOXACIN <=0.25 SENSITIVE Sensitive     GENTAMICIN <=1 SENSITIVE Sensitive     IMIPENEM 2 SENSITIVE Sensitive     PIP/TAZO <=4 SENSITIVE Sensitive     CEFEPIME <=1 SENSITIVE Sensitive     * MODERATE PSEUDOMONAS AERUGINOSA  CULTURE, BLOOD (ROUTINE X 2) w Reflex to ID Panel     Status: None (Preliminary result)   Collection Time: 12/22/17  4:07 AM  Result Value Ref Range Status   Specimen Description BLOOD BLOOD RIGHT WRIST  Final   Special Requests   Final    BOTTLES DRAWN AEROBIC AND ANAEROBIC Blood Culture adequate volume   Culture   Final    NO GROWTH 3 DAYS Performed at [facility], [address] [place], [place] [address]     Report Status PENDING  Incomplete  CULTURE, BLOOD (ROUTINE X 2) w Reflex to ID Panel     Status: None (Preliminary result)   Collection Time: 12/22/17  4:15 AM  Result Value Ref Range Status   Specimen Description BLOOD BLOOD RIGHT HAND  Final   Special Requests   Final    BOTTLES DRAWN AEROBIC AND ANAEROBIC Blood Culture results may not be optimal due to an excessive volume of blood received in culture bottles   Culture   Final    NO GROWTH 3 DAYS Performed at [facility], [address] [place], [place] [address]    Report Status PENDING  Incomplete  MRSA PCR Screening     Status: Abnormal   Collection Time: 12/24/17  5:43 PM  Result Value Ref Range Status   MRSA by PCR POSITIVE (A) NEGATIVE Final    Comment:        The GeneXpert MRSA Assay (FDA approved for NASAL specimens only), is one component of a comprehensive MRSA colonization surveillance program. It is not intended to diagnose MRSA infection nor to guide or monitor treatment for MRSA infections. RESULT CALLED TO, READ BACK BY AND VERIFIED WITH: CHARLOTTE GADDY AT 1936 12/24/17.PMH Performed at [facility], [address] [place], [place] [address]     Coagulation Studies: No results for input(s): LABPROT, INR in the last 72 hours.  Urinalysis: No results for input(s): COLORURINE, LABSPEC, PHURINE, GLUCOSEU, HGBUR, BILIRUBINUR, KETONESUR, PROTEINUR, UROBILINOGEN, NITRITE, LEUKOCYTESUR in the last 72 hours.  Invalid input(s): APPERANCEUR    Imaging: Dg Chest 2 View  Result Date: 12/23/2017 CLINICAL DATA:  Possible sepsis. EXAM: CHEST - 2 VIEW COMPARISON:  12/21/2017 FINDINGS: Enlarged cardiac silhouette.  Mediastinal contours appear intact. Peribronchial airspace consolidation, left more than right lower lobe. No evidence of pneumothorax or pleural effusion. Osseous structures are without acute abnormality. Soft tissues are grossly normal. IMPRESSION: Enlarged cardiac silhouette.  Bilateral lower lobe peribronchial airspace consolidation may represent bronchitic changes. Electronically Signed   By: Fidela Salisbury M.D.   On: 12/23/2017 16:13     Medications:    . acetaminophen  650 mg Oral Q6H  . allopurinol  100 mg Oral Daily  . amLODipine  10 mg Oral Daily  . atorvastatin  10 mg Oral Daily  . bisacodyl  10 mg Oral Daily  . Chlorhexidine Gluconate Cloth  6 each Topical Q0600  . clonazepam  0.25 mg Oral QHS  . clopidogrel  75 mg Oral Daily  .  famotidine  20 mg Oral Daily  . feeding supplement (NEPRO CARB STEADY)  237 mL Oral BID BM  . furosemide  40 mg Oral Daily  . gabapentin  200 mg Oral BID  . heparin  5,000 Units Subcutaneous Q8H  . hydrALAZINE  25 mg Oral Daily  . irbesartan  300 mg Oral Daily  . multivitamin  1 tablet Oral QHS  . multivitamin-lutein  1 capsule Oral Daily  . pentafluoroprop-tetrafluoroeth   Topical Once  . senna-docusate  1 tablet Oral BID   metoCLOPramide (REGLAN) injection, ondansetron **OR** ondansetron (ZOFRAN) IV, promethazine, traMADol  Assessment/ Plan:  67 y.o. African-American male ESRD, cardiomyopathy, hypertension, anemia of ckd, shpth, e. coli bacteremia 9/12, left hand inflammatory arthritis resolved, angioplasty cephalic vein 1/01, access declot 9/15. colonoscopy 02/2015, admission for chest pain 11/18/17.  [facility]/ [facility]/MWF/LUE AVG/EDW kg  1.  End-stage renal disease 2.  Nausea and vomiting 3.  Secondary hyperparathyroidism, off binders and cinacalcet 4.  Anemia chronic kidney disease, Hgb 8.8 5.  Peripheral vascular disease 6.  Gastroparesis and gastritis.  EGD 12/19/2017 7.  Fever, wound infection  Plan: Patient seen and evaluated during hemodialysis and tolerating well.  Ultrafiltration target 1.5 kg.  Hemoglobin down to 8.8.  Start the patient on Epogen 4000 units IV with dialysis.  Binders and cinacalcet currently on hold given nausea and vomiting.  Management of lower extremity wound as per  hospitalist.    LOS: 3 Michaeleen Down 8/19/201912:20 PM  [place], [place]  Note: This note was prepared with Dragon dictation. Any transcription errors are unintentional

## 2017-12-25 NOTE — Progress Notes (Signed)
Nutrition Follow Up Note   DOCUMENTATION CODES:   Severe malnutrition in context of acute illness/injury  INTERVENTION:   Ocuvite daily for wound healing (provides zinc, vitamin A, vitamin C, Vitamin E, copper, and selenium)  Rena-vite daily  Nepro Shake po BID, each supplement provides 425 kcal and 19 grams protein  NUTRITION DIAGNOSIS:   Severe Malnutrition related to acute illness as evidenced by 12 percent weight loss in 1 month, energy intake < or equal to 50% for > or equal to 5 days.  GOAL:   Patient will meet greater than or equal to 90% of their needs  -progressing   MONITOR:   PO intake, Supplement acceptance, Labs, Weight trends, Skin, I & O's  ASSESSMENT:   67 y.o. African-American male with end-stage renal disease on hemodialysis, peripheral artery disease, with femoral stent placement, on Plavix admitted with unintentional weight loss, severe constipation and intractable nausea and vomiting.  Pt with improved appetite and oral intake; eating 100% of meals and drinking some Nepro. Per chart, pt is weight stable since admit. Pt s/p EGD 8/13; found to have suspected gastroparesis, gastritis and aberrant pancreas. Nausea and vomiting resolved with addition of reglan. GI recommending outpatient gastric emptying study. Recommend continue supplements and vitamins to encourage wound healing. Pt to discharge today.   Medications reviewed and include: allopurinol, dulcolax, plavix, pepcid, heparin, ocuvite, rena-vite, senokot  Labs reviewed: BUN 31(H), creat 9.32(H)  Diet Order:   Diet Order            DIET SOFT Room service appropriate? Yes; Fluid consistency: Thin  Diet effective now             EDUCATION NEEDS:   Education needs have been addressed  Skin:  Skin Assessment: Reviewed RN Assessment(1.4 cm x 2 cmx 0.3 cm LEFT foot)  Last BM:  8/16- TYPE 6  Height:   Ht Readings from Last 1 Encounters:  12/19/17 5\' 8"  (1.727 m)    Weight:   Wt Readings  from Last 1 Encounters:  12/25/17 70.8 kg    Ideal Body Weight:  70 kg  BMI:  Body mass index is 23.73 kg/m.  Estimated Nutritional Needs:   Kcal:  1900-2200kcal/day   Protein:  95-110g/day   Fluid:  UOP + 1 L  Koleen Distance MS, RD, LDN Pager #- [phone] Office#- [phone] After Hours Pager: [phone]

## 2017-12-25 NOTE — Progress Notes (Signed)
HD Treatment Initiated    12/25/17 0947  Vital Signs  Pulse Rate 95  Pulse Rate Source Monitor  Resp 10  BP (!) 159/144  BP Location Right Arm  BP Method Automatic  Patient Position (if appropriate) Lying  Oxygen Therapy  SpO2 100 %  O2 Device Room Air  During Hemodialysis Assessment  Blood Flow Rate (mL/min) 400 mL/min  Arterial Pressure (mmHg) -180 mmHg  Venous Pressure (mmHg) 130 mmHg  Transmembrane Pressure (mmHg) 70 mmHg  Ultrafiltration Rate (mL/min) 570 mL/min  Dialysate Flow Rate (mL/min) 800 ml/min  Conductivity: Machine  13.8  HD Safety Checks Performed Yes  Dialysis Fluid Bolus Normal Saline  Bolus Amount (mL) 250 mL  Intra-Hemodialysis Comments Tx initiated  Education / Care Plan  Dialysis Education Provided Yes  Documented Education in Care Plan Yes  Fistula / Graft Left Upper arm  No Placement Date or Time found.   Orientation: Left  Access Location: Upper arm  Status Accessed  Needle Size 15

## 2017-12-25 NOTE — Progress Notes (Deleted)
Pre HD Treatment    12/25/17 0917  Vital Signs  Temp 98.5 F (36.9 C)  Temp Source Oral  Pulse Rate 92  Pulse Rate Source Monitor  Resp 13  BP (!) 199/82  BP Location Right Arm  BP Method Automatic  Patient Position (if appropriate) Lying  Oxygen Therapy  SpO2 98 %  O2 Device Room Air  Pain Assessment  Pain Scale 0-10  Pain Score 0  POSS Scale ([place] Opioid Sedation Scale)  POSS *See Group Information* 1-Acceptable,Awake and alert  Dialysis Weight  Weight 70.8 kg  Type of Weight Pre-Dialysis  Time-Out for Hemodialysis  What Procedure? HD  Pt Identifiers(min of two) First/Last Name;MRN/Account#;Pt's DOB(use if MRN/Acct# not available  Correct Site? Yes  Correct Side? Yes  Correct Procedure? Yes  Consents Verified? Yes  Rad Studies Available? N/A  Safety Precautions Reviewed? Yes  Engineer, civil (consulting) Number [phone] (6A)  Station Number 4  UF/Alarm Test Passed  Conductivity: Meter 14  Conductivity: Machine  13.9  pH 7.4  Reverse Osmosis Main  Normal Saline Lot Number [id number]  Dialyzer Lot Number [id number]  Disposable Set Lot Number [id number]  Machine Temperature 98.6 F (37 C)  Musician and Audible Yes  Blood Lines Intact and Secured Yes  Pre Treatment Patient Checks  Vascular access used during treatment Graft  Hepatitis B Surface Antigen Results Negative  Date Hepatitis B Surface Antigen Drawn 04/26/17  Hepatitis B Surface Antibody  (>10)  Date Hepatitis B Surface Antibody Drawn 04/26/17  Hemodialysis Consent Verified Yes  Hemodialysis Standing Orders Initiated Yes  ECG (Telemetry) Monitor On Yes  Prime Ordered Normal Saline  Length of  DialysisTreatment -hour(s) 3.5 Hour(s)  Dialysis Treatment Comments Na 140  Dialyzer Elisio 17H NR  Dialysate 2K, 2.5 Ca  Dialysis Anticoagulant None  Dialysate Flow Ordered 800  Blood Flow Rate Ordered 400 mL/min  Ultrafiltration Goal 1.5 Liters  Pre Treatment Labs Renal panel;Phosphorus  Dialysis Blood  Pressure Support Ordered Normal Saline  Fistula / Graft Left Upper arm  No Placement Date or Time found.   Orientation: Left  Access Location: Upper arm  Site Condition No complications  Fistula / Graft Assessment Present;Thrill;Bruit  Drainage Description None

## 2017-12-25 NOTE — Progress Notes (Signed)
HD Assessment    12/25/17 0917  Neurological  Level of Consciousness Alert  Orientation Level Oriented X4  Respiratory  Respiratory Pattern Regular;Unlabored;Symmetrical  Chest Assessment Chest expansion symmetrical  Bilateral Breath Sounds Diminished  Cardiac  Pulse Regular  Heart Sounds S1, S2  ECG Monitor Yes  Cardiac Rhythm NSR  Vascular  R Radial Pulse +2  L Radial Pulse +2  Edema Generalized  Integumentary  Integumentary (WDL) X  Skin Color Appropriate for ethnicity  Skin Condition Dry;Flaky  Musculoskeletal  Musculoskeletal (WDL) X  Generalized Weakness Yes  Gastrointestinal  Bowel Sounds Assessment Active  GU Assessment  Genitourinary (WDL) X (HD pt)  Psychosocial  Psychosocial (WDL) WDL

## 2017-12-26 ENCOUNTER — Encounter (INDEPENDENT_AMBULATORY_CARE_PROVIDER_SITE_OTHER): Payer: Self-pay

## 2017-12-26 ENCOUNTER — Ambulatory Visit (INDEPENDENT_AMBULATORY_CARE_PROVIDER_SITE_OTHER): Payer: Medicare Other | Admitting: Vascular Surgery

## 2017-12-26 VITALS — BP 143/80 | HR 103 | Resp 16 | Wt 153.1 lb

## 2017-12-26 DIAGNOSIS — I890 Lymphedema, not elsewhere classified: Secondary | ICD-10-CM | POA: Diagnosis not present

## 2017-12-26 MED FILL — Epoetin Alfa Inj 10000 Unit/ML: INTRAMUSCULAR | Qty: 1 | Status: AC

## 2017-12-26 NOTE — Progress Notes (Signed)
History of Present Illness  There is no documented history at this time  Assessments & Plan   There are no diagnoses linked to this encounter.    Additional instructions  Subjective:  Patient presents with venous ulcer of the Left lower extremity.    Procedure:  3 layer unna wrap was placed Left lower extremity.   Plan:   Follow up in one week.  

## 2017-12-27 ENCOUNTER — Telehealth: Payer: Self-pay | Admitting: Gastroenterology

## 2017-12-27 LAB — CULTURE, BLOOD (ROUTINE X 2)
Culture: NO GROWTH
Culture: NO GROWTH
Special Requests: ADEQUATE

## 2017-12-27 NOTE — Telephone Encounter (Signed)
Left vm for pt to call office and schedule Fu with Dr. Marius Ditch

## 2017-12-27 NOTE — Telephone Encounter (Signed)
-----   Message from [name], RMA sent at 12/26/2017  4:38 PM EDT ----- Regarding: follow up appt Please call pt and schedule 4 week follow up

## 2017-12-28 ENCOUNTER — Encounter: Payer: Self-pay | Admitting: Gastroenterology

## 2017-12-28 ENCOUNTER — Telehealth: Payer: Self-pay | Admitting: Gastroenterology

## 2017-12-28 NOTE — Telephone Encounter (Signed)
-----   Message from [name], RMA sent at 12/26/2017  4:38 PM EDT ----- Regarding: follow up appt Please call pt and schedule 4 week follow up

## 2017-12-28 NOTE — Telephone Encounter (Signed)
Left vm for pt to call office and schedule fu apt with Dr. Marius Ditch per note

## 2018-01-02 ENCOUNTER — Ambulatory Visit (INDEPENDENT_AMBULATORY_CARE_PROVIDER_SITE_OTHER): Payer: Medicare Other | Admitting: Nurse Practitioner

## 2018-01-02 ENCOUNTER — Encounter (INDEPENDENT_AMBULATORY_CARE_PROVIDER_SITE_OTHER): Payer: Self-pay

## 2018-01-02 DIAGNOSIS — I890 Lymphedema, not elsewhere classified: Secondary | ICD-10-CM | POA: Diagnosis not present

## 2018-01-02 NOTE — Progress Notes (Signed)
History of Present Illness  There is no documented history at this time  Assessments & Plan   There are no diagnoses linked to this encounter.    Additional instructions  Subjective:  Patient presents with venous ulcer of the Left lower extremity.    Procedure:  3 layer unna wrap was placed Left lower extremity.   Plan:   Follow up in one week.  

## 2018-01-05 ENCOUNTER — Encounter (INDEPENDENT_AMBULATORY_CARE_PROVIDER_SITE_OTHER): Payer: Self-pay | Admitting: Nurse Practitioner

## 2018-01-09 ENCOUNTER — Ambulatory Visit (INDEPENDENT_AMBULATORY_CARE_PROVIDER_SITE_OTHER): Payer: Medicare Other | Admitting: Vascular Surgery

## 2018-01-09 ENCOUNTER — Encounter (INDEPENDENT_AMBULATORY_CARE_PROVIDER_SITE_OTHER): Payer: Self-pay | Admitting: Vascular Surgery

## 2018-01-09 ENCOUNTER — Other Ambulatory Visit: Payer: Self-pay

## 2018-01-09 VITALS — BP 105/52 | HR 96 | Ht 68.0 in | Wt 156.0 lb

## 2018-01-09 DIAGNOSIS — I7025 Atherosclerosis of native arteries of other extremities with ulceration: Secondary | ICD-10-CM

## 2018-01-09 DIAGNOSIS — N186 End stage renal disease: Secondary | ICD-10-CM

## 2018-01-09 DIAGNOSIS — I10 Essential (primary) hypertension: Secondary | ICD-10-CM | POA: Diagnosis not present

## 2018-01-09 DIAGNOSIS — I890 Lymphedema, not elsewhere classified: Secondary | ICD-10-CM | POA: Diagnosis not present

## 2018-01-09 DIAGNOSIS — Z992 Dependence on renal dialysis: Secondary | ICD-10-CM

## 2018-01-09 NOTE — Progress Notes (Signed)
MRN : [id number]  Johnny [name]. is a 67 y.o. (1950-11-02) male who presents with chief complaint of  Chief Complaint  Patient presents with  . Follow-up    left and right leg pain and wound. Unna wrap check  .  History of Present Illness: Patient returns today in follow up of PAD and ulceration.  His left foot ulcerations are slowly improving and the swelling is much better with weekly Unna boots.  He has been in and out of the hospital and was seen by my partner last month.  Unfortunately, he has developed ulcerations on the right foot now.  He has one on the right heel/Achilles area and another on the medial aspect of the right heel/midfoot.  He is also having a lot more right foot pain.  The left foot pain and swelling have improved but not entirely resolved.  No fevers or chills. He continues to get dialysis on Mondays, Wednesdays, and Fridays.  His access is currently working well without any major issues.  Current Outpatient Medications  Medication Sig Dispense Refill  . allopurinol (ZYLOPRIM) 100 MG tablet Take 100 mg by mouth daily.    [name] amLODipine (NORVASC) 10 MG tablet Take 10 mg by mouth daily.     [name] atorvastatin (LIPITOR) 10 MG tablet Take 1 tablet by mouth daily.    . Calcium Gluconate 50 MG TABS Take 50 mg by mouth 3 (three) times daily.     . clonazePAM (KLONOPIN) 0.5 MG tablet Take 0.25 mg by mouth at bedtime.     . clopidogrel (PLAVIX) 75 MG tablet Take 1 tablet (75 mg total) by mouth daily. 30 tablet 11  . colchicine 0.6 MG tablet Take 0.6 mg by mouth daily as needed (for gout flares).     . collagenase (SANTYL) ointment Apply topically daily. 15 g 0  . folic acid-vitamin b complex-vitamin c-selenium-zinc (DIALYVITE) 3 MG TABS tablet Take by mouth.    . furosemide (LASIX) 40 MG tablet Take 40 mg by mouth daily.     [name] gabapentin (NEURONTIN) 100 MG capsule Take 200 mg by mouth 2 (two) times daily.    . hydrALAZINE (APRESOLINE) 25 MG tablet Take 25 mg by mouth daily.      . irbesartan (AVAPRO) 300 MG tablet Take 300 mg by mouth daily.     [name] lidocaine-prilocaine (EMLA) cream Apply 1 application topically as needed (port access).    . multivitamin (RENA-VIT) TABS tablet Take 1 tablet by mouth at bedtime. 30 tablet 0  . multivitamin-lutein (OCUVITE-LUTEIN) CAPS capsule Take 1 capsule by mouth daily. 30 capsule 0  . Nutritional Supplements (FEEDING SUPPLEMENT, NEPRO CARB STEADY,) LIQD Take 237 mLs by mouth 2 (two) times daily between meals. 30 Can 0  . ondansetron (ZOFRAN ODT) 4 MG disintegrating tablet Take 1 tablet (4 mg total) by mouth every 8 (eight) hours as needed for nausea or vomiting. 20 tablet 0  . pregabalin (LYRICA) 50 MG capsule Take 50 mg by mouth daily. After each dialysis session    . traMADol (ULTRAM) 50 MG tablet One to Two Tabs By Mouth Every 8 Hours As Needed For Pain 25 tablet 0  . famotidine (PEPCID) 20 MG tablet Take 1 tablet (20 mg total) by mouth daily. 30 tablet 0   No current facility-administered medications for this visit.     Past Medical History:  Diagnosis Date  . Anemia   . CHF (congestive heart failure) ([place])   . Chronic kidney disease   .  Gout   . Hyperlipidemia   . Hypertension     Past Surgical History:  Procedure Laterality Date  . A/V SHUNTOGRAM Left 06/21/2017   Procedure: A/V SHUNTOGRAM;  Surgeon: Katha Cabal, MD;  Location: [place] [facility];  Service: Cardiovascular;  Laterality: Left;  . AV FISTULA PLACEMENT Left 09/18/2015   Procedure: INSERTION OF ARTERIOVENOUS (AV) GORE-TEX GRAFT ARM ( BRACH/AXILLARY GRAFT W/ INSTANT STICK GRAFT );  Surgeon: Katha Cabal, MD;  Location: [facility];  Service: Vascular;  Laterality: Left;  . DIALYSIS FISTULA CREATION    . ESOPHAGOGASTRODUODENOSCOPY N/A 12/19/2017   Procedure: ESOPHAGOGASTRODUODENOSCOPY (EGD);  Surgeon: Lin Landsman, MD;  Location: [facility];  Service: Gastroenterology;  Laterality: N/A;  . LOWER EXTREMITY ANGIOGRAPHY Left 11/16/2017    Procedure: LOWER EXTREMITY ANGIOGRAPHY;  Surgeon: Algernon Huxley, MD;  Location: [place] [facility];  Service: Cardiovascular;  Laterality: Left;  . PERIPHERAL VASCULAR CATHETERIZATION Left 09/01/2015   Procedure: A/V Shuntogram/Fistulagram;  Surgeon: Katha Cabal, MD;  Location: [place] [facility];  Service: Cardiovascular;  Laterality: Left;  . PERIPHERAL VASCULAR CATHETERIZATION N/A 09/30/2015   Procedure: A/V Shuntogram/Fistulagram with perm cathether removal;  Surgeon: Algernon Huxley, MD;  Location: [place] [facility];  Service: Cardiovascular;  Laterality: N/A;  . PERIPHERAL VASCULAR CATHETERIZATION Left 09/30/2015   Procedure: A/V Shunt Intervention;  Surgeon: Algernon Huxley, MD;  Location: [place] [facility];  Service: Cardiovascular;  Laterality: Left;  . PERIPHERAL VASCULAR CATHETERIZATION Left 12/03/2015   Procedure: Thrombectomy;  Surgeon: Algernon Huxley, MD;  Location: [place] [facility];  Service: Cardiovascular;  Laterality: Left;  . PERIPHERAL VASCULAR CATHETERIZATION Left 01/28/2016   Procedure: Thrombectomy;  Surgeon: Algernon Huxley, MD;  Location: [place] [facility];  Service: Cardiovascular;  Laterality: Left;  . PERIPHERAL VASCULAR CATHETERIZATION N/A 01/28/2016   Procedure: A/V Shuntogram/Fistulagram;  Surgeon: Algernon Huxley, MD;  Location: [place] [facility];  Service: Cardiovascular;  Laterality: N/A;    Social History       Tobacco Use  . Smoking status: Former Research scientist (life sciences)  . Smokeless tobacco: Never Used  Substance Use Topics  . Alcohol use: No  . Drug use: No     Family History      Family History  Problem Relation Age of Onset  . Hypertension Unknown   . Heart disease Unknown   no bleeding disorders, clotting disorders, aneurysms, or autoimmune diseases      Allergies  Allergen Reactions  . Shellfish Allergy Anaphylaxis     REVIEW OF SYSTEMS(Negative unless checked)  Constitutional: _0 Weight loss_1 Fever_2 Chills Cardiac:_3 Chest  pain_4 Chest pressure_5 Palpitations _6 Shortness of breath when laying flat _7 Shortness of breath at rest _8 Shortness of breath with exertion. Vascular: _9 Pain in legs with walking_10 Pain in legsat rest_11 Pain in legs when laying flat _12 Claudication _13 Pain in feet when walking _14 Pain in feet at rest _15 Pain in feet when laying flat _16 History of DVT _17 Phlebitis _18 Swelling in legs _19 Varicose veins _20 Non-healing ulcers Pulmonary: _21 Uses home oxygen _22 Productive cough_23 Hemoptysis _24 Wheeze _25 COPD _26 Asthma Neurologic: _27 Dizziness _28 Blackouts _29 Seizures _30 History of stroke _31 History of TIA_32 Aphasia _33 Temporary blindness_34 Dysphagia _35 Weaknessor numbness in arms _36 Weakness or numbnessin legs Musculoskeletal: _37 Arthritis _38 Joint swelling _39 Joint pain _40 Low back pain Hematologic:_41 Easy bruising_42 Easy bleeding _43 Hypercoagulable state _44 Anemic  Gastrointestinal:_45 Blood in stool_46 Vomiting blood_47 Gastroesophageal reflux/heartburn_48 Abdominal pain Genitourinary: _49 Chronic kidney disease _50 Difficulturination _51 Frequenturination _52 Burning with urination_53 Hematuria Skin: _54 Rashes _55 Ulcers _56 Wounds Psychological: _57 History of anxiety_58 History of major depression.    Physical Examination  BP (!) 105/52 (BP Location: Right Arm)   Pulse 96   Ht _59  (1.727  m)   Wt 156 lb (70.8 kg)   BMI 23.72 kg/m  Gen:  WD/WN, NAD Head: [place]/AT, No temporalis wasting. Ear/Nose/Throat: Hearing grossly intact, nares w/o erythema or drainage Eyes: Conjunctiva clear. Sclera non-icteric Neck: Supple.  Trachea midline Pulmonary:  Good air movement, no use of accessory muscles.  Cardiac: Irregular Vascular: thrill present in the left arm access Vessel Right Left  Radial Palpable Palpable                          PT Not Palpable 1+ Palpable  DP Trace Palpable 1+ Palpable   Gastrointestinal: soft,  non-tender/non-distended.  Musculoskeletal: In a wheelchair today.  Scabs on the right medial heel/midfoot as well as the upper heel and Achilles area posterior.  1+ right lower extremity edema.  The left lower extremity edema is now quite mild with the [employer].  The left lateral foot ulceration has decent granulation tissue and is improving.  Scab still present on toes 3 and 5 on the left foot with dark discoloration Neurologic: Sensation managed in the feet, right worse than left.  Symmetrical.  Speech is fluent.  Psychiatric: Judgment intact, Mood & affect appropriate for pt's clinical situation. Dermatologic: No rashes or ulcers noted.  No cellulitis or open wounds.       Labs Recent Results (from the past 2160 hour(s))  Basic metabolic panel     Status: Abnormal   Collection Time: 11/18/17 12:16 AM  Result Value Ref Range   Sodium 138 135 - 145 mmol/L   Potassium 4.5 3.5 - 5.1 mmol/L    Comment: HEMOLYSIS AT THIS LEVEL MAY AFFECT RESULT   Chloride 97 (L) 98 - 111 mmol/L    Comment: Please note change in reference range.   CO2 28 22 - 32 mmol/L   Glucose, Bld 86 70 - 99 mg/dL    Comment: Please note change in reference range.   BUN 22 8 - 23 mg/dL    Comment: Please note change in reference range.   Creatinine, Ser 6.32 (H) 0.61 - 1.24 mg/dL   Calcium 8.9 8.9 - 10.3 mg/dL   GFR calc non Af Amer 8 (L) >60 mL/min   GFR calc Af Amer 9 (L) >60 mL/min    Comment: (NOTE) The eGFR has been calculated using the CKD EPI equation. This calculation has not been validated in all clinical situations. eGFR's persistently <60 mL/min signify possible Chronic Kidney Disease.    Anion gap 13 5 - 15    Comment: Performed at [facility], [address] [place], [place] [address]  CBC     Status: Abnormal   Collection Time: 11/18/17 12:56 AM  Result Value Ref Range   WBC 9.3 3.8 - 10.6 K/uL   RBC 3.25 (L) 4.40 - 5.90 MIL/uL   Hemoglobin 10.1 (L) 13.0 - 18.0 g/dL   HCT 30.0  (L) 40.0 - 52.0 %   MCV 92.3 80.0 - 100.0 fL   MCH 31.0 26.0 - 34.0 pg   MCHC 33.6 32.0 - 36.0 g/dL   RDW 14.9 (H) 11.5 - 14.5 %   Platelets 155 150 - 440 K/uL    Comment: Performed at [facility], [address] [place], [place] [address]  CBC with Differential     Status: Abnormal   Collection Time: 11/19/17  1:46 AM  Result Value Ref Range   WBC 9.3 3.8 - 10.6 K/uL   RBC 3.38 (L) 4.40 -  5.90 MIL/uL   Hemoglobin 10.6 (L) 13.0 - 18.0 g/dL   HCT 31.4 (L) 40.0 - 52.0 %   MCV 92.9 80.0 - 100.0 fL   MCH 31.5 26.0 - 34.0 pg   MCHC 33.8 32.0 - 36.0 g/dL   RDW 15.2 (H) 11.5 - 14.5 %   Platelets 169 150 - 440 K/uL   Neutrophils Relative % 81 %   Neutro Abs 7.6 (H) 1.4 - 6.5 K/uL   Lymphocytes Relative 7 %   Lymphs Abs 0.6 (L) 1.0 - 3.6 K/uL   Monocytes Relative 11 %   Monocytes Absolute 1.0 0.2 - 1.0 K/uL   Eosinophils Relative 1 %   Eosinophils Absolute 0.1 0 - 0.7 K/uL   Basophils Relative 0 %   Basophils Absolute 0.0 0 - 0.1 K/uL    Comment: Performed at [facility], [address] [place], [place] [address]  Comprehensive metabolic panel     Status: Abnormal   Collection Time: 11/19/17  1:46 AM  Result Value Ref Range   Sodium 137 135 - 145 mmol/L   Potassium 4.3 3.5 - 5.1 mmol/L   Chloride 96 (L) 98 - 111 mmol/L    Comment: Please note change in reference range.   CO2 26 22 - 32 mmol/L   Glucose, Bld 69 (L) 70 - 99 mg/dL    Comment: Please note change in reference range.   BUN 35 (H) 8 - 23 mg/dL    Comment: Please note change in reference range.   Creatinine, Ser 9.36 (H) 0.61 - 1.24 mg/dL   Calcium 8.8 (L) 8.9 - 10.3 mg/dL   Total Protein 7.6 6.5 - 8.1 g/dL   Albumin 3.7 3.5 - 5.0 g/dL   AST 19 15 - 41 U/L   ALT <5 0 - 44 U/L    Comment: Please note change in reference range.   Alkaline Phosphatase 46 38 - 126 U/L   Total Bilirubin 0.6 0.3 - 1.2 mg/dL   GFR calc non Af Amer 5 (L) >60 mL/min   GFR calc Af Amer 6 (L) >60 mL/min    Comment:  (NOTE) The eGFR has been calculated using the CKD EPI equation. This calculation has not been validated in all clinical situations. eGFR's persistently <60 mL/min signify possible Chronic Kidney Disease.    Anion gap 15 5 - 15    Comment: Performed at [facility], [address] [place], [place] [address]  Troponin I     Status: Abnormal   Collection Time: 11/19/17  1:46 AM  Result Value Ref Range   Troponin I 0.08 (HH) <0.03 ng/mL    Comment: CRITICAL RESULT CALLED TO, READ BACK BY AND VERIFIED WITH APRIL BRUMGARD AT 0219 11/19/17.PMH Performed at [facility], [address] [place], [place] [address]   Culture, blood (routine x 2)     Status: None   Collection Time: 11/19/17  1:46 AM  Result Value Ref Range   Specimen Description BLOOD RIGHT HAND    Special Requests      BOTTLES DRAWN AEROBIC AND ANAEROBIC Blood Culture adequate volume   Culture      NO GROWTH 5 DAYS Performed at [facility], [address] [place], [place] [address]    Report Status 11/24/2017 FINAL   Lactic acid, plasma     Status: Abnormal   Collection Time: 11/19/17  1:46 AM  Result Value Ref Range   Lactic Acid, Venous 2.0 (HH) 0.5 - 1.9 mmol/L  Comment: CRITICAL RESULT CALLED TO, READ BACK BY AND VERIFIED WITH APRIL BRUMGARD AT 0255 11/19/17.PMH Performed at [facility], [address] [place], [place] [address]   Culture, blood (routine x 2)     Status: None   Collection Time: 11/19/17  3:24 AM  Result Value Ref Range   Specimen Description BLOOD BLOOD RIGHT HAND    Special Requests      BOTTLES DRAWN AEROBIC AND ANAEROBIC Blood Culture adequate volume   Culture      NO GROWTH 5 DAYS Performed at [facility], [address] [place], [place] [address]    Report Status 11/24/2017 FINAL   Lactic acid, plasma     Status: None   Collection Time: 11/19/17  3:24 AM  Result Value Ref Range   Lactic Acid, Venous 1.1 0.5 - 1.9 mmol/L     Comment: Performed at [facility], [address] [place], [place] [address]  TSH     Status: None   Collection Time: 11/19/17  5:28 AM  Result Value Ref Range   TSH 1.466 0.350 - 4.500 uIU/mL    Comment: Performed by a 3rd Generation assay with a functional sensitivity of <=0.01 uIU/mL. Performed at [facility], [address] [place], [place] [address]   Troponin I     Status: Abnormal   Collection Time: 11/19/17  5:28 AM  Result Value Ref Range   Troponin I 0.08 (HH) <0.03 ng/mL    Comment: CRITICAL VALUE NOTED. VALUE IS CONSISTENT WITH PREVIOUSLY REPORTED/CALLED VALUE SNJ Performed at [facility], [address] [place], [place] [address]   Troponin I     Status: Abnormal   Collection Time: 11/19/17 11:13 AM  Result Value Ref Range   Troponin I 0.09 (HH) <0.03 ng/mL    Comment: CRITICAL VALUE NOTED. VALUE IS CONSISTENT WITH PREVIOUSLY REPORTED/CALLED VALUE SNJ Performed at [facility], [address] [place], [place] [address]   Troponin I     Status: Abnormal   Collection Time: 11/19/17  7:45 PM  Result Value Ref Range   Troponin I 0.08 (HH) <0.03 ng/mL    Comment: CRITICAL VALUE NOTED. VALUE IS CONSISTENT WITH PREVIOUSLY REPORTED/CALLED VALUE.PMH Performed at [facility], [address] [place], [place] [address]   MRSA PCR Screening     Status: None   Collection Time: 11/20/17  3:32 AM  Result Value Ref Range   MRSA by PCR NEGATIVE NEGATIVE    Comment:        The GeneXpert MRSA Assay (FDA approved for NASAL specimens only), is one component of a comprehensive MRSA colonization surveillance program. It is not intended to diagnose MRSA infection nor to guide or monitor treatment for MRSA infections. Performed at [facility], [address] [place], [place] [address]   NM Myocar Multi W/Spect Tamela Oddi Motion / EF     Status: None   Collection Time: 11/20/17 12:05 PM  Result Value Ref Range   Rest  HR 77 bpm   Rest BP 152/70 mmHg   Exercise duration (sec) 0 sec   Percent HR 59 %   Exercise duration (min) 1 min   Estimated workload 1.0 METS   Peak HR 90 bpm   Peak BP 137/62 mmHg   MPHR 152 bpm   SSS 3    SRS 2    SDS 1    TID 1.01    LV sys vol 63 mL   LV dias vol  413 62 - 150 mL  Comprehensive metabolic panel     Status: Abnormal   Collection Time: 11/27/17  3:26 PM  Result Value Ref Range   Sodium 138 135 - 145 mmol/L   Potassium 3.9 3.5 - 5.1 mmol/L   Chloride 94 (L) 98 - 111 mmol/L   CO2 30 22 - 32 mmol/L   Glucose, Bld 95 70 - 99 mg/dL   BUN 19 8 - 23 mg/dL   Creatinine, Ser 6.89 (H) 0.61 - 1.24 mg/dL   Calcium 9.2 8.9 - 10.3 mg/dL   Total Protein 7.8 6.5 - 8.1 g/dL   Albumin 3.5 3.5 - 5.0 g/dL   AST 21 15 - 41 U/L   ALT 5 0 - 44 U/L   Alkaline Phosphatase 51 38 - 126 U/L   Total Bilirubin 0.7 0.3 - 1.2 mg/dL   GFR calc non Af Amer 7 (L) >60 mL/min   GFR calc Af Amer 8 (L) >60 mL/min    Comment: (NOTE) The eGFR has been calculated using the CKD EPI equation. This calculation has not been validated in all clinical situations. eGFR's persistently <60 mL/min signify possible Chronic Kidney Disease.    Anion gap 14 5 - 15    Comment: Performed at [facility], [address] [place], [place] [address]  CBC with Differential     Status: Abnormal   Collection Time: 11/27/17  3:26 PM  Result Value Ref Range   WBC 12.4 (H) 3.8 - 10.6 K/uL   RBC 3.23 (L) 4.40 - 5.90 MIL/uL   Hemoglobin 10.2 (L) 13.0 - 18.0 g/dL   HCT 30.1 (L) 40.0 - 52.0 %   MCV 93.3 80.0 - 100.0 fL   MCH 31.5 26.0 - 34.0 pg   MCHC 33.7 32.0 - 36.0 g/dL   RDW 15.1 (H) 11.5 - 14.5 %   Platelets 275 150 - 440 K/uL   Neutrophils Relative % 82 %   Neutro Abs 10.2 (H) 1.4 - 6.5 K/uL   Lymphocytes Relative 8 %   Lymphs Abs 1.0 1.0 - 3.6 K/uL   Monocytes Relative 8 %   Monocytes Absolute 1.0 0.2 - 1.0 K/uL   Eosinophils Relative 1 %   Eosinophils Absolute 0.1 0 - 0.7 K/uL   Basophils  Relative 1 %   Basophils Absolute 0.1 0 - 0.1 K/uL    Comment: Performed at [facility], [address] [place], [place] [address]  Sedimentation rate     Status: Abnormal   Collection Time: 11/27/17  3:26 PM  Result Value Ref Range   Sed Rate 67 (H) 0 - 20 mm/hr    Comment: Performed at [facility], [address], [place], [place] [address]  MRSA PCR Screening     Status: None   Collection Time: 11/27/17  6:37 PM  Result Value Ref Range   MRSA by PCR NEGATIVE NEGATIVE    Comment:        The GeneXpert MRSA Assay (FDA approved for NASAL specimens only), is one component of a comprehensive MRSA colonization surveillance program. It is not intended to diagnose MRSA infection nor to guide or monitor treatment for MRSA infections. Performed at [facility], [address] [place], [place] [address]   HIV antibody (Routine Testing)     Status: None   Collection Time: 11/28/17  4:25 AM  Result Value Ref Range   HIV Screen 4th Generation wRfx Non Reactive Non Reactive    Comment: (NOTE)  Performed At: [facility] [place], [place] [id number] Rush Farmer MD [id number]   Basic metabolic panel     Status: Abnormal   Collection Time: 11/28/17  4:25 AM  Result Value Ref Range   Sodium 141 135 - 145 mmol/L   Potassium 4.5 3.5 - 5.1 mmol/L   Chloride 98 98 - 111 mmol/L   CO2 34 (H) 22 - 32 mmol/L   Glucose, Bld 92 70 - 99 mg/dL   BUN 22 8 - 23 mg/dL   Creatinine, Ser 8.33 (H) 0.61 - 1.24 mg/dL   Calcium 9.6 8.9 - 10.3 mg/dL   GFR calc non Af Amer 6 (L) >60 mL/min   GFR calc Af Amer 7 (L) >60 mL/min    Comment: (NOTE) The eGFR has been calculated using the CKD EPI equation. This calculation has not been validated in all clinical situations. eGFR's persistently <60 mL/min signify possible Chronic Kidney Disease.    Anion gap 9 5 - 15    Comment: Performed at [facility], [address] [place], [place]  [address]  CBC     Status: Abnormal   Collection Time: 11/28/17  4:25 AM  Result Value Ref Range   WBC 10.3 3.8 - 10.6 K/uL   RBC 3.20 (L) 4.40 - 5.90 MIL/uL   Hemoglobin 10.1 (L) 13.0 - 18.0 g/dL   HCT 29.9 (L) 40.0 - 52.0 %   MCV 93.5 80.0 - 100.0 fL   MCH 31.6 26.0 - 34.0 pg   MCHC 33.9 32.0 - 36.0 g/dL   RDW 15.2 (H) 11.5 - 14.5 %   Platelets 248 150 - 440 K/uL    Comment: Performed at [facility], [address] [place], [place] [address]  Renal function panel     Status: Abnormal   Collection Time: 11/29/17  8:02 AM  Result Value Ref Range   Sodium 140 135 - 145 mmol/L   Potassium 4.4 3.5 - 5.1 mmol/L   Chloride 97 (L) 98 - 111 mmol/L   CO2 32 22 - 32 mmol/L   Glucose, Bld 83 70 - 99 mg/dL   BUN 34 (H) 8 - 23 mg/dL   Creatinine, Ser 10.65 (H) 0.61 - 1.24 mg/dL   Calcium 9.2 8.9 - 10.3 mg/dL   Phosphorus 6.3 (H) 2.5 - 4.6 mg/dL   Albumin 2.9 (L) 3.5 - 5.0 g/dL   GFR calc non Af Amer 4 (L) >60 mL/min   GFR calc Af Amer 5 (L) >60 mL/min    Comment: (NOTE) The eGFR has been calculated using the CKD EPI equation. This calculation has not been validated in all clinical situations. eGFR's persistently <60 mL/min signify possible Chronic Kidney Disease.    Anion gap 11 5 - 15    Comment: Performed at [facility], [address] [place], [place] [address]  CBC     Status: Abnormal   Collection Time: 11/29/17  8:02 AM  Result Value Ref Range   WBC 10.6 3.8 - 10.6 K/uL   RBC 2.93 (L) 4.40 - 5.90 MIL/uL   Hemoglobin 9.5 (L) 13.0 - 18.0 g/dL   HCT 27.4 (L) 40.0 - 52.0 %   MCV 93.6 80.0 - 100.0 fL   MCH 32.6 26.0 - 34.0 pg   MCHC 34.8 32.0 - 36.0 g/dL   RDW 14.9 (H) 11.5 - 14.5 %   Platelets 230 150 - 440 K/uL    Comment: Performed at [facility], [address] [place], [place] [address]  CBC     Status: Abnormal   Collection Time: 12/01/17  2:59 PM  Result Value Ref Range   WBC 12.5 (H) 3.8 - 10.6 K/uL   RBC 2.91 (L) 4.40 - 5.90 MIL/uL     Hemoglobin 9.1 (L) 13.0 - 18.0 g/dL   HCT 27.6 (L) 40.0 - 52.0 %   MCV 94.7 80.0 - 100.0 fL   MCH 31.3 26.0 - 34.0 pg   MCHC 33.0 32.0 - 36.0 g/dL   RDW 15.1 (H) 11.5 - 14.5 %   Platelets 285 150 - 440 K/uL    Comment: Performed at [facility], [address] [place], [place] [address]  Glucose, capillary     Status: Abnormal   Collection Time: 12/01/17  4:04 PM  Result Value Ref Range   Glucose-Capillary 115 (H) 70 - 99 mg/dL  CBC with Differential     Status: Abnormal   Collection Time: 12/18/17  7:04 PM  Result Value Ref Range   WBC 7.8 3.8 - 10.6 K/uL   RBC 3.44 (L) 4.40 - 5.90 MIL/uL   Hemoglobin 11.0 (L) 13.0 - 18.0 g/dL   HCT 33.4 (L) 40.0 - 52.0 %   MCV 97.1 80.0 - 100.0 fL   MCH 31.8 26.0 - 34.0 pg   MCHC 32.8 32.0 - 36.0 g/dL   RDW 17.4 (H) 11.5 - 14.5 %   Platelets 217 150 - 440 K/uL   Neutrophils Relative % 75 %   Neutro Abs 5.9 1.4 - 6.5 K/uL   Lymphocytes Relative 11 %   Lymphs Abs 0.9 (L) 1.0 - 3.6 K/uL   Monocytes Relative 11 %   Monocytes Absolute 0.8 0.2 - 1.0 K/uL   Eosinophils Relative 2 %   Eosinophils Absolute 0.2 0 - 0.7 K/uL   Basophils Relative 1 %   Basophils Absolute 0.1 0 - 0.1 K/uL    Comment: Performed at [facility], [address] [place], [place] [address]  Comprehensive metabolic panel     Status: Abnormal   Collection Time: 12/18/17  7:56 PM  Result Value Ref Range   Sodium 136 135 - 145 mmol/L   Potassium 3.5 3.5 - 5.1 mmol/L   Chloride 96 (L) 98 - 111 mmol/L   CO2 29 22 - 32 mmol/L   Glucose, Bld 91 70 - 99 mg/dL   BUN 11 8 - 23 mg/dL   Creatinine, Ser 5.74 (H) 0.61 - 1.24 mg/dL   Calcium 9.7 8.9 - 10.3 mg/dL   Total Protein 7.8 6.5 - 8.1 g/dL   Albumin 3.8 3.5 - 5.0 g/dL   AST 24 15 - 41 U/L   ALT 6 0 - 44 U/L   Alkaline Phosphatase 55 38 - 126 U/L   Total Bilirubin 0.7 0.3 - 1.2 mg/dL   GFR calc non Af Amer 9 (L) >60 mL/min   GFR calc Af Amer 10 (L) >60 mL/min    Comment: (NOTE) The eGFR has been  calculated using the CKD EPI equation. This calculation has not been validated in all clinical situations. eGFR's persistently <60 mL/min signify possible Chronic Kidney Disease.    Anion gap 11 5 - 15    Comment: Performed at [facility], [address] [place], [place] [address]  Lipase, blood     Status: None   Collection Time: 12/18/17  7:56 PM  Result Value Ref Range   Lipase 30 11 - 51 U/L    Comment:  Performed at [facility], [address] [place], [place] [address]  Troponin I     Status: Abnormal   Collection Time: 12/18/17  7:56 PM  Result Value Ref Range   Troponin I 0.04 (HH) <0.03 ng/mL    Comment: CRITICAL RESULT CALLED TO, READ BACK BY AND VERIFIED WITH COLE AMORIELLO 12/18/17 @ 2033  [place] Performed at [facility], [address] [place], [place] [address]   Basic metabolic panel     Status: Abnormal   Collection Time: 12/19/17  2:19 AM  Result Value Ref Range   Sodium 135 135 - 145 mmol/L   Potassium 3.8 3.5 - 5.1 mmol/L   Chloride 95 (L) 98 - 111 mmol/L   CO2 31 22 - 32 mmol/L   Glucose, Bld 72 70 - 99 mg/dL   BUN 12 8 - 23 mg/dL   Creatinine, Ser 6.19 (H) 0.61 - 1.24 mg/dL   Calcium 9.6 8.9 - 10.3 mg/dL   GFR calc non Af Amer 8 (L) >60 mL/min   GFR calc Af Amer 10 (L) >60 mL/min    Comment: (NOTE) The eGFR has been calculated using the CKD EPI equation. This calculation has not been validated in all clinical situations. eGFR's persistently <60 mL/min signify possible Chronic Kidney Disease.    Anion gap 9 5 - 15    Comment: Performed at [facility], [address] [place], [place] [address]  CBC     Status: Abnormal   Collection Time: 12/19/17  2:19 AM  Result Value Ref Range   WBC 6.9 3.8 - 10.6 K/uL   RBC 3.22 (L) 4.40 - 5.90 MIL/uL   Hemoglobin 10.4 (L) 13.0 - 18.0 g/dL   HCT 31.2 (L) 40.0 - 52.0 %   MCV 97.1 80.0 - 100.0 fL   MCH 32.2 26.0 - 34.0 pg   MCHC 33.2 32.0 - 36.0 g/dL   RDW 17.4 (H) 11.5  - 14.5 %   Platelets 208 150 - 440 K/uL    Comment: Performed at [facility], [address] [place], [place] [address]  Phosphorus     Status: None   Collection Time: 12/19/17  2:19 AM  Result Value Ref Range   Phosphorus 4.6 2.5 - 4.6 mg/dL    Comment: Performed at [facility], [address] [place], [place] [address]  Surgical pathology     Status: None   Collection Time: 12/19/17  3:17 PM  Result Value Ref Range   SURGICAL PATHOLOGY      Surgical Pathology CASE: [id number] PATIENT: [name] Surgical Pathology Report     SPECIMEN SUBMITTED: A. Stomach,random;cbx  CLINICAL HISTORY: None provided  PRE-OPERATIVE DIAGNOSIS: Intractable nausea/vomiting  POST-OPERATIVE DIAGNOSIS: Gastric, retained food in stomach     DIAGNOSIS: A. STOMACH; RANDOM COLD BIOPSY: - MILD REACTIVE GASTROPATHY. - NEGATIVE FOR ACTIVE INFLAMMATION, H. PYLORI, INTESTINAL METAPLASIA, DYSPLASIA, AND MALIGNANCY.   GROSS DESCRIPTION: A. Labeled: Cbx random stomach Received: In formalin Tissue fragment(s): 4 Size: 0.1-0.4 cm Description: Shaggy Tan fragments Entirely submitted in one cassette.   Final Diagnosis performed by Hinsch Lemma, MD.   Electronically signed 12/21/2017 5:04:41PM The electronic signature indicates that the named Attending Pathologist has evaluated the specimen  Technical component performed at [facility], [address] [place], [place] [address] Lab: [phone] Dir: Rush Farmer, MD, MM M  Professional component performed at [facility], [facility], [address], [place], [place] [address] Lab: [phone] Dir: Dellia Nims. Reuel Derby, MD  Uric acid     Status: Abnormal   Collection Time: 12/21/17  2:03 PM  Result Value Ref Range   Uric Acid, Serum 2.7 (L) 3.7 - 8.6 mg/dL    Comment: Performed at [facility], [address] [place], [place] [address]  Lactic acid, plasma     Status: Abnormal   Collection  Time: 12/21/17  2:03 PM  Result Value Ref Range   Lactic Acid, Venous 2.1 (HH) 0.5 - 1.9 mmol/L    Comment: CRITICAL RESULT CALLED TO, READ BACK BY AND VERIFIED WITH GINA BENTON _0  12/21/17 AKT Performed at [facility], [address] [place], [place] [address]   Procalcitonin - Baseline     Status: None   Collection Time: 12/21/17  2:03 PM  Result Value Ref Range   Procalcitonin 4.51 ng/mL    Comment:        Interpretation: PCT > 2 ng/mL: Systemic infection (sepsis) is likely, unless other causes are known. (NOTE)       Sepsis PCT Algorithm           Lower Respiratory Tract                                      Infection PCT Algorithm    ----------------------------     ----------------------------         PCT < 0.25 ng/mL                PCT < 0.10 ng/mL         Strongly encourage             Strongly discourage   discontinuation of antibiotics    initiation of antibiotics    ----------------------------     -----------------------------       PCT 0.25 - 0.50 ng/mL            PCT 0.10 - 0.25 ng/mL               OR       >80% decrease in PCT            Discourage initiation of                                            antibiotics      Encourage discontinuation           of antibiotics    ----------------------------     -----------------------------         PCT >= 0.50 ng/mL              PCT 0.26 - 0.50 ng/mL               AND       <80% decrease in PCT              Encourage initiation of                                             antibiotics       Encourage continuation           of antibiotics    ----------------------------     -----------------------------  PCT >= 0.50 ng/mL                  PCT > 0.50 ng/mL               AND         increase in PCT                  Strongly encourage                                      initiation of antibiotics    Strongly encourage escalation           of antibiotics                                      -----------------------------                                           PCT <= 0.25 ng/mL                                                 OR                                        > 80% decrease in PCT                                     Discontinue / Do not initiate                                             antibiotics Performed at [facility], [address], [place], [place] [address]   Aerobic Culture (superficial specimen)     Status: None   Collection Time: 12/21/17  5:15 PM  Result Value Ref Range   Specimen Description      WOUND Performed at [facility], [address], [place], [place] [address]    Special Requests      Immunocompromised Performed at [facility], [place], [place] [address]    Gram Stain      FEW WBC PRESENT, PREDOMINANTLY PMN FEW GRAM NEGATIVE RODS FEW GRAM POSITIVE RODS Performed at [facility], [place] [address], [place], [place] [address]    Culture      MODERATE PSEUDOMONAS AERUGINOSA MODERATE ENTEROBACTER SPECIES    Report Status 12/24/2017 FINAL    Organism ID, Bacteria PSEUDOMONAS AERUGINOSA    Organism ID, Bacteria ENTEROBACTER SPECIES       Susceptibility   Enterobacter species - MIC*    CEFAZOLIN >=64 RESISTANT Resistant     CEFEPIME <=1 SENSITIVE Sensitive     CEFTAZIDIME <=1 SENSITIVE Sensitive     CEFTRIAXONE <=1 SENSITIVE Sensitive     CIPROFLOXACIN <=0.25 SENSITIVE Sensitive  GENTAMICIN <=1 SENSITIVE Sensitive     IMIPENEM 0.5 SENSITIVE Sensitive     TRIMETH/SULFA <=20 SENSITIVE Sensitive     PIP/TAZO <=4 SENSITIVE Sensitive     * MODERATE ENTEROBACTER SPECIES   Pseudomonas aeruginosa - MIC*    CEFTAZIDIME <=1 SENSITIVE Sensitive     CIPROFLOXACIN <=0.25 SENSITIVE Sensitive     GENTAMICIN <=1 SENSITIVE Sensitive     IMIPENEM 2 SENSITIVE Sensitive     PIP/TAZO <=4 SENSITIVE Sensitive     CEFEPIME <=1 SENSITIVE Sensitive     * MODERATE PSEUDOMONAS AERUGINOSA    CULTURE, BLOOD (ROUTINE X 2) w Reflex to ID Panel     Status: None   Collection Time: 12/22/17  4:07 AM  Result Value Ref Range   Specimen Description BLOOD BLOOD RIGHT WRIST    Special Requests      BOTTLES DRAWN AEROBIC AND ANAEROBIC Blood Culture adequate volume   Culture      NO GROWTH 5 DAYS Performed at [facility], [address] [place], [place] [address]    Report Status 12/27/2017 FINAL   Procalcitonin     Status: None   Collection Time: 12/22/17  4:07 AM  Result Value Ref Range   Procalcitonin 4.10 ng/mL    Comment:        Interpretation: PCT > 2 ng/mL: Systemic infection (sepsis) is likely, unless other causes are known. (NOTE)       Sepsis PCT Algorithm           Lower Respiratory Tract                                      Infection PCT Algorithm    ----------------------------     ----------------------------         PCT < 0.25 ng/mL                PCT < 0.10 ng/mL         Strongly encourage             Strongly discourage   discontinuation of antibiotics    initiation of antibiotics    ----------------------------     -----------------------------       PCT 0.25 - 0.50 ng/mL            PCT 0.10 - 0.25 ng/mL               OR       >80% decrease in PCT            Discourage initiation of                                            antibiotics      Encourage discontinuation           of antibiotics    ----------------------------     -----------------------------         PCT >= 0.50 ng/mL              PCT 0.26 - 0.50 ng/mL               AND       <80% decrease in PCT              Encourage initiation of  antibiotics       Encourage continuation           of antibiotics    ----------------------------     -----------------------------        PCT >= 0.50 ng/mL                  PCT > 0.50 ng/mL               AND         increase in PCT                  Strongly encourage                                       initiation of antibiotics    Strongly encourage escalation           of antibiotics                                     -----------------------------                                           PCT <= 0.25 ng/mL                                                 OR                                        > 80% decrease in PCT                                     Discontinue / Do not initiate                                             antibiotics Performed at [facility], [address] [place], [place] [address]   CULTURE, BLOOD (ROUTINE X 2) w Reflex to ID Panel     Status: None   Collection Time: 12/22/17  4:15 AM  Result Value Ref Range   Specimen Description BLOOD BLOOD RIGHT HAND    Special Requests      BOTTLES DRAWN AEROBIC AND ANAEROBIC Blood Culture results may not be optimal due to an excessive volume of blood received in culture bottles   Culture      NO GROWTH 5 DAYS Performed at [facility], [address] [place], [place] [address]    Report Status 12/27/2017 FINAL   Lactic acid, plasma     Status: None   Collection Time: 12/22/17  9:43 AM  Result Value Ref Range   Lactic Acid, Venous 1.0 0.5 - 1.9 mmol/L    Comment: Performed at [facility], [address] [place], [place] [address]  Lactic acid, plasma  Status: None   Collection Time: 12/22/17  3:13 PM  Result Value Ref Range   Lactic Acid, Venous 0.7 0.5 - 1.9 mmol/L    Comment: Performed at [facility], [address] [place],  [address]  Procalcitonin     Status: None   Collection Time: 12/23/17  3:41 AM  Result Value Ref Range   Procalcitonin 3.65 ng/mL    Comment:        Interpretation: PCT > 2 ng/mL: Systemic infection (sepsis) is likely, unless other causes are known. (NOTE)       Sepsis PCT Algorithm           Lower Respiratory Tract                                      Infection PCT Algorithm    ----------------------------      ----------------------------         PCT < 0.25 ng/mL                PCT < 0.10 ng/mL         Strongly encourage             Strongly discourage   discontinuation of antibiotics    initiation of antibiotics    ----------------------------     -----------------------------       PCT 0.25 - 0.50 ng/mL            PCT 0.10 - 0.25 ng/mL               OR       >80% decrease in PCT            Discourage initiation of                                            antibiotics      Encourage discontinuation           of antibiotics    ----------------------------     -----------------------------         PCT >= 0.50 ng/mL              PCT 0.26 - 0.50 ng/mL               AND       <80% decrease in PCT              Encourage initiation of                                             antibiotics       Encourage continuation           of antibiotics    ----------------------------     -----------------------------        PCT >= 0.50 ng/mL                  PCT > 0.50 ng/mL               AND         increase in PCT                  Strongly  encourage                                      initiation of antibiotics    Strongly encourage escalation           of antibiotics                                     -----------------------------                                           PCT <= 0.25 ng/mL                                                 OR                                        > 80% decrease in PCT                                     Discontinue / Do not initiate                                             antibiotics Performed at [facility], [address] [place], [place] [address]   CBC     Status: Abnormal   Collection Time: 12/23/17  3:41 AM  Result Value Ref Range   WBC 7.1 3.8 - 10.6 K/uL   RBC 2.84 (L) 4.40 - 5.90 MIL/uL   Hemoglobin 9.2 (L) 13.0 - 18.0 g/dL   HCT 27.3 (L) 40.0 - 52.0 %   MCV 96.0 80.0 - 100.0 fL   MCH 32.4 26.0 - 34.0 pg   MCHC 33.7 32.0 - 36.0 g/dL     RDW 17.0 (H) 11.5 - 14.5 %   Platelets 176 150 - 440 K/uL    Comment: Performed at [facility], [address] [place], [place] [address]  Basic metabolic panel     Status: Abnormal   Collection Time: 12/23/17  3:41 AM  Result Value Ref Range   Sodium 142 135 - 145 mmol/L   Potassium 3.6 3.5 - 5.1 mmol/L   Chloride 102 98 - 111 mmol/L   CO2 30 22 - 32 mmol/L   Glucose, Bld 77 70 - 99 mg/dL   BUN 15 8 - 23 mg/dL   Creatinine, Ser 5.10 (H) 0.61 - 1.24 mg/dL   Calcium 9.4 8.9 - 10.3 mg/dL   GFR calc non Af Amer 10 (L) >60 mL/min   GFR calc Af Amer 12 (L) >60 mL/min    Comment: (NOTE) The eGFR has been calculated using the CKD EPI equation. This calculation has not been validated in all clinical situations. eGFR's persistently <60 mL/min signify possible Chronic Kidney Disease.    Anion gap 10  5 - 15    Comment: Performed at [facility], [address] [place], [place] [address]  Procalcitonin - Baseline     Status: None   Collection Time: 12/23/17 11:03 AM  Result Value Ref Range   Procalcitonin 3.94 ng/mL    Comment:        Interpretation: PCT > 2 ng/mL: Systemic infection (sepsis) is likely, unless other causes are known. (NOTE)       Sepsis PCT Algorithm           Lower Respiratory Tract                                      Infection PCT Algorithm    ----------------------------     ----------------------------         PCT < 0.25 ng/mL                PCT < 0.10 ng/mL         Strongly encourage             Strongly discourage   discontinuation of antibiotics    initiation of antibiotics    ----------------------------     -----------------------------       PCT 0.25 - 0.50 ng/mL            PCT 0.10 - 0.25 ng/mL               OR       >80% decrease in PCT            Discourage initiation of                                            antibiotics      Encourage discontinuation           of antibiotics    ----------------------------      -----------------------------         PCT >= 0.50 ng/mL              PCT 0.26 - 0.50 ng/mL               AND       <80% decrease in PCT              Encourage initiation of                                             antibiotics       Encourage continuation           of antibiotics    ----------------------------     -----------------------------        PCT >= 0.50 ng/mL                  PCT > 0.50 ng/mL               AND         increase in PCT                  Strongly encourage  initiation of antibiotics    Strongly encourage escalation           of antibiotics                                     -----------------------------                                           PCT <= 0.25 ng/mL                                                 OR                                        > 80% decrease in PCT                                     Discontinue / Do not initiate                                             antibiotics Performed at [facility], [address] [place], [place] [address]   Lactic acid, plasma     Status: None   Collection Time: 12/23/17 11:03 AM  Result Value Ref Range   Lactic Acid, Venous 1.2 0.5 - 1.9 mmol/L    Comment: Performed at [facility], [address] [place], [place] [address]  Lactic acid, plasma     Status: Abnormal   Collection Time: 12/23/17  3:16 PM  Result Value Ref Range   Lactic Acid, Venous 2.2 (HH) 0.5 - 1.9 mmol/L    Comment: CRITICAL RESULT CALLED TO, READ BACK BY AND VERIFIED WITH TERRY TURNER 12/23/17 @ [place] Performed at [facility], [address] [place], [place] [address]   Procalcitonin     Status: None   Collection Time: 12/24/17  6:09 AM  Result Value Ref Range   Procalcitonin 4.13 ng/mL    Comment:        Interpretation: PCT > 2 ng/mL: Systemic infection (sepsis) is likely, unless other causes are known. (NOTE)       Sepsis PCT Algorithm            Lower Respiratory Tract                                      Infection PCT Algorithm    ----------------------------     ----------------------------         PCT < 0.25 ng/mL                PCT < 0.10 ng/mL         Strongly encourage             Strongly discourage   discontinuation of antibiotics  initiation of antibiotics    ----------------------------     -----------------------------       PCT 0.25 - 0.50 ng/mL            PCT 0.10 - 0.25 ng/mL               OR       >80% decrease in PCT            Discourage initiation of                                            antibiotics      Encourage discontinuation           of antibiotics    ----------------------------     -----------------------------         PCT >= 0.50 ng/mL              PCT 0.26 - 0.50 ng/mL               AND       <80% decrease in PCT              Encourage initiation of                                             antibiotics       Encourage continuation           of antibiotics    ----------------------------     -----------------------------        PCT >= 0.50 ng/mL                  PCT > 0.50 ng/mL               AND         increase in PCT                  Strongly encourage                                      initiation of antibiotics    Strongly encourage escalation           of antibiotics                                     -----------------------------                                           PCT <= 0.25 ng/mL                                                 OR                                        >  80% decrease in PCT                                     Discontinue / Do not initiate                                             antibiotics Performed at [facility], [address] [place], [place] [address]   CBC with Differential/Platelet     Status: Abnormal   Collection Time: 12/24/17  6:09 AM  Result Value Ref Range   WBC 6.7 3.8 - 10.6 K/uL   RBC 2.76 (L) 4.40 - 5.90 MIL/uL    Hemoglobin 8.8 (L) 13.0 - 18.0 g/dL   HCT 26.7 (L) 40.0 - 52.0 %   MCV 96.6 80.0 - 100.0 fL   MCH 31.9 26.0 - 34.0 pg   MCHC 33.0 32.0 - 36.0 g/dL   RDW 17.4 (H) 11.5 - 14.5 %   Platelets 185 150 - 440 K/uL   Neutrophils Relative % 66 %   Neutro Abs 4.4 1.4 - 6.5 K/uL   Lymphocytes Relative 18 %   Lymphs Abs 1.2 1.0 - 3.6 K/uL   Monocytes Relative 11 %   Monocytes Absolute 0.8 0.2 - 1.0 K/uL   Eosinophils Relative 4 %   Eosinophils Absolute 0.3 0 - 0.7 K/uL   Basophils Relative 1 %   Basophils Absolute 0.1 0 - 0.1 K/uL    Comment: Performed at [facility], [address] [place], [place] [address]  MRSA PCR Screening     Status: Abnormal   Collection Time: 12/24/17  5:43 PM  Result Value Ref Range   MRSA by PCR POSITIVE (A) NEGATIVE    Comment:        The GeneXpert MRSA Assay (FDA approved for NASAL specimens only), is one component of a comprehensive MRSA colonization surveillance program. It is not intended to diagnose MRSA infection nor to guide or monitor treatment for MRSA infections. RESULT CALLED TO, READ BACK BY AND VERIFIED WITH: CHARLOTTE GADDY AT 1936 12/24/17.PMH Performed at [facility], [address] [place], [place] [address]   Procalcitonin     Status: None   Collection Time: 12/25/17  6:07 AM  Result Value Ref Range   Procalcitonin 4.74 ng/mL    Comment:        Interpretation: PCT > 2 ng/mL: Systemic infection (sepsis) is likely, unless other causes are known. (NOTE)       Sepsis PCT Algorithm           Lower Respiratory Tract                                      Infection PCT Algorithm    ----------------------------     ----------------------------         PCT < 0.25 ng/mL                PCT < 0.10 ng/mL         Strongly encourage             Strongly discourage   discontinuation of antibiotics    initiation of antibiotics    ----------------------------     -----------------------------  PCT 0.25 - 0.50 ng/mL             PCT 0.10 - 0.25 ng/mL               OR       >80% decrease in PCT            Discourage initiation of                                            antibiotics      Encourage discontinuation           of antibiotics    ----------------------------     -----------------------------         PCT >= 0.50 ng/mL              PCT 0.26 - 0.50 ng/mL               AND       <80% decrease in PCT              Encourage initiation of                                             antibiotics       Encourage continuation           of antibiotics    ----------------------------     -----------------------------        PCT >= 0.50 ng/mL                  PCT > 0.50 ng/mL               AND         increase in PCT                  Strongly encourage                                      initiation of antibiotics    Strongly encourage escalation           of antibiotics                                     -----------------------------                                           PCT <= 0.25 ng/mL                                                 OR                                        > 80% decrease in PCT  Discontinue / Do not initiate                                             antibiotics Performed at [facility], [address] [place], [place] [address]   Renal function panel     Status: Abnormal   Collection Time: 12/25/17  6:07 AM  Result Value Ref Range   Sodium 142 135 - 145 mmol/L   Potassium 4.0 3.5 - 5.1 mmol/L   Chloride 101 98 - 111 mmol/L   CO2 27 22 - 32 mmol/L   Glucose, Bld 61 (L) 70 - 99 mg/dL   BUN 31 (H) 8 - 23 mg/dL   Creatinine, Ser 9.32 (H) 0.61 - 1.24 mg/dL   Calcium 10.0 8.9 - 10.3 mg/dL   Phosphorus 4.4 2.5 - 4.6 mg/dL   Albumin 2.9 (L) 3.5 - 5.0 g/dL   GFR calc non Af Amer 5 (L) >60 mL/min   GFR calc Af Amer 6 (L) >60 mL/min    Comment: (NOTE) The eGFR has been calculated using the CKD EPI equation. This calculation has  not been validated in all clinical situations. eGFR's persistently <60 mL/min signify possible Chronic Kidney Disease.    Anion gap 14 5 - 15    Comment: Performed at [facility], [address], [place], [place] [address]    Radiology Dg Chest 2 View  Result Date: 12/23/2017 CLINICAL DATA:  Possible sepsis. EXAM: CHEST - 2 VIEW COMPARISON:  12/21/2017 FINDINGS: Enlarged cardiac silhouette.  Mediastinal contours appear intact. Peribronchial airspace consolidation, left more than right lower lobe. No evidence of pneumothorax or pleural effusion. Osseous structures are without acute abnormality. Soft tissues are grossly normal. IMPRESSION: Enlarged cardiac silhouette. Bilateral lower lobe peribronchial airspace consolidation may represent bronchitic changes. Electronically Signed   By: Fidela Salisbury M.D.   On: 12/23/2017 16:13   Dg Chest 2 View  Result Date: 12/21/2017 CLINICAL DATA:  Elevated lactic acid level EXAM: CHEST - 2 VIEW COMPARISON:  11/19/2017 FINDINGS: Normal heart size and mediastinal contours. There is no edema, consolidation, effusion, or pneumothorax. Extensive atherosclerotic calcification the subclavians. There is venous stenting on the left is patient with dialysis access. IMPRESSION: No evidence of active disease. Electronically Signed   By: Monte Fantasia M.D.   On: 12/21/2017 16:11   Ct Angio Abd/pel W And/or Wo Contrast  Result Date: 12/18/2017 CLINICAL DATA:  67 year old with end-stage renal disease on hemodialysis with 6 week history of abdominal pain that acutely worsened after dialysis today. Unexplained 30 pound weight loss over the past 1 month. EXAM: CTA ABDOMEN AND PELVIS WITH CONTRAST TECHNIQUE: Multidetector CT imaging of the abdomen and pelvis was performed using the standard protocol during bolus administration of intravenous contrast. Multiplanar reconstructed images and MIPs were obtained and reviewed to evaluate the vascular anatomy. CONTRAST:   116m OMNIPAQUE IOHEXOL 350 MG/ML IV. COMPARISON:  None. FINDINGS: VASCULAR Aorta: Severe atherosclerosis with calcified and noncalcified plaque. No evidence of aneurysm. Celiac: Atherosclerosis at its origin without evidence of hemodynamically significant stenosis. SMA: Atherosclerosis at its origin and in its descending portion without evidence of significant stenosis. Renals: 2 RIGHT renal arteries with possible hemodynamically significant stenosis at the origin of the RIGHT main renal artery very small accessory UPPER pole renal artery which arises from the aorta just proximal to the main renal artery. Single LEFT renal  artery with proximal atherosclerosis though no evidence of hemodynamically significant stenosis. IMA: Patent. Inflow: Severe BILATERAL common iliac artery atherosclerosis without evidence of hemodynamically significant stenosis. Proximal Outflow: Severe BILATERAL common femoral and proximal superficial femoral artery atherosclerosis with possible hemodynamically significant stenoses in common femoral arteries. Veins: Not evaluated. Review of the MIP images confirms the above findings. NON-VASCULAR Lower chest: Minimal scarring involving the LEFT LOWER LOBE. Visualized lung bases otherwise clear. Heart markedly enlarged with LAD and RIGHT coronary atherosclerosis and mitral annular calcification. Hepatobiliary: Liver normal in size and appearance. Gallbladder normal in appearance without calcified gallstones. No biliary ductal dilation. Pancreas: Normal in appearance without evidence of mass, ductal dilation, or inflammation. Spleen: Normal in size and appearance. Small focus of accessory splenic tissue MEDIAL to the LOWER pole adjacent the pancreatic tail. Adrenals/Urinary Tract: Normal appearing adrenal glands. Both kidneys demonstrate contrast enhancement, though are atrophic and contain numerous cortical cysts. No urinary tract calculi. No hydronephrosis. Urinary bladder unremarkable.  Stomach/Bowel: Stomach normal in appearance for the degree of distention. Normal-appearing small bowel. Sigmoid colon elongated without evidence of diverticulosis. Remainder of the colon normal in appearance. Normal appendix in the RIGHT mid abdomen and RIGHT UPPER pelvis just ANTERIOR to the RIGHT psoas muscle. Lymphatic: No pathologic lymphadenopathy. Reproductive: Moderate prostate gland enlargement, particularly the median lobe. Scattered seminal vesicle calcifications. Other: Large BILATERAL inguinal hernias containing fat. High riding RIGHT testicle versus mass in the RIGHT inguinal canal. Small umbilical hernia containing fat. Musculoskeletal: DISH involving the LOWER thoracic spine. Large Schmorl's nodes involving L1, L2 and L4. Severe facet degenerative changes throughout the lumbar spine. Ankylosis of the sacroiliac joints. No acute findings. IMPRESSION: VASCULAR 1. 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 2. Possible hemodynamically significant stenosis involving the origin of the RIGHT main renal artery. Please note that there is a small accessory RIGHT UPPER pole renal artery which originates from the aorta just above the main renal artery. 3. Severe BILATERAL iliofemoral atherosclerosis with possible hemodynamically significant stenoses involving both common femoral arteries. 4. 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 NON-VASCULAR 1. No acute abnormalities involving the abdomen or pelvis. 2. 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 3. Large BILATERAL inguinal hernias containing fat. High riding RIGHT testicle versus mass in the RIGHT inguinal canal. Please correlate with physical examination as to the location of the RIGHT testicle. Electronically Signed   By: Evangeline Dakin M.D.   On: 12/18/2017 20:55    Assessment/Plan HTN (hypertension) blood pressure control  important in reducing the progression of atherosclerotic disease. On appropriate oral medications.   ESRD on dialysis [facility]) Access working well.  Increases the risk of wound healing issues.   Lymphedema Continue Unna boots on the left leg.  This is helping the swelling.  Atherosclerosis of native arteries of the extremities with ulceration ([place]) This is now a bilateral problem.  This was discussed with the patient and his family that this is a difficult and limb threatening situation.  His left leg is improving after revascularization but the wounds are still fairly extensive.  He and his wife understand that this could happen on the right leg, and they are interested in proceeding with angiogram to see if perfusion could be improved so that the wounds do not get as bad on the right leg as the left leg.  That certainly sounds like a reasonable option.  Risks and benefits are discussed.  They are agreeable to proceed  Leotis Pain, MD  01/09/2018 2:45 PM    This note was created with Dragon medical transcription system.  Any errors from dictation are purely unintentional

## 2018-01-09 NOTE — Patient Instructions (Signed)

## 2018-01-09 NOTE — Assessment & Plan Note (Signed)
Continue Unna boots on the left leg.  This is helping the swelling.

## 2018-01-09 NOTE — Assessment & Plan Note (Signed)
This is now a bilateral problem.  This was discussed with the patient and his family that this is a difficult and limb threatening situation.  His left leg is improving after revascularization but the wounds are still fairly extensive.  He and his wife understand that this could happen on the right leg, and they are interested in proceeding with angiogram to see if perfusion could be improved so that the wounds do not get as bad on the right leg as the left leg.  That certainly sounds like a reasonable option.  Risks and benefits are discussed.  They are agreeable to proceed

## 2018-01-11 ENCOUNTER — Encounter (INDEPENDENT_AMBULATORY_CARE_PROVIDER_SITE_OTHER): Payer: Self-pay

## 2018-01-12 ENCOUNTER — Other Ambulatory Visit (INDEPENDENT_AMBULATORY_CARE_PROVIDER_SITE_OTHER): Payer: Self-pay | Admitting: Nurse Practitioner

## 2018-01-16 ENCOUNTER — Encounter (INDEPENDENT_AMBULATORY_CARE_PROVIDER_SITE_OTHER): Payer: Medicare Other

## 2018-01-16 ENCOUNTER — Telehealth (INDEPENDENT_AMBULATORY_CARE_PROVIDER_SITE_OTHER): Payer: Self-pay | Admitting: Vascular Surgery

## 2018-01-16 ENCOUNTER — Encounter
Admission: RE | Admit: 2018-01-16 | Discharge: 2018-01-16 | Disposition: A | Discharge status: Home / Self Care | Payer: Medicare Other | Source: Ambulatory Visit | Attending: Vascular Surgery | Admitting: Vascular Surgery

## 2018-01-16 ENCOUNTER — Ambulatory Visit (INDEPENDENT_AMBULATORY_CARE_PROVIDER_SITE_OTHER): Payer: Medicare Other | Admitting: Vascular Surgery

## 2018-01-16 DIAGNOSIS — Z8249 Family history of ischemic heart disease and other diseases of the circulatory system: Secondary | ICD-10-CM | POA: Diagnosis not present

## 2018-01-16 DIAGNOSIS — I70235 Atherosclerosis of native arteries of right leg with ulceration of other part of foot: Secondary | ICD-10-CM | POA: Diagnosis present

## 2018-01-16 DIAGNOSIS — Z91013 Allergy to seafood: Secondary | ICD-10-CM | POA: Diagnosis not present

## 2018-01-16 DIAGNOSIS — Z9889 Other specified postprocedural states: Secondary | ICD-10-CM | POA: Diagnosis not present

## 2018-01-16 DIAGNOSIS — Z87891 Personal history of nicotine dependence: Secondary | ICD-10-CM | POA: Diagnosis not present

## 2018-01-16 DIAGNOSIS — Z7902 Long term (current) use of antithrombotics/antiplatelets: Secondary | ICD-10-CM | POA: Diagnosis not present

## 2018-01-16 DIAGNOSIS — Z992 Dependence on renal dialysis: Secondary | ICD-10-CM | POA: Diagnosis not present

## 2018-01-16 DIAGNOSIS — M109 Gout, unspecified: Secondary | ICD-10-CM | POA: Diagnosis not present

## 2018-01-16 DIAGNOSIS — L97511 Non-pressure chronic ulcer of other part of right foot limited to breakdown of skin: Secondary | ICD-10-CM | POA: Diagnosis not present

## 2018-01-16 DIAGNOSIS — Z79899 Other long term (current) drug therapy: Secondary | ICD-10-CM | POA: Diagnosis not present

## 2018-01-16 DIAGNOSIS — E785 Hyperlipidemia, unspecified: Secondary | ICD-10-CM | POA: Diagnosis not present

## 2018-01-16 DIAGNOSIS — I130 Hypertensive heart and chronic kidney disease with heart failure and stage 1 through stage 4 chronic kidney disease, or unspecified chronic kidney disease: Secondary | ICD-10-CM | POA: Diagnosis not present

## 2018-01-16 DIAGNOSIS — N186 End stage renal disease: Secondary | ICD-10-CM | POA: Diagnosis not present

## 2018-01-16 DIAGNOSIS — I509 Heart failure, unspecified: Secondary | ICD-10-CM | POA: Diagnosis not present

## 2018-01-16 DIAGNOSIS — I890 Lymphedema, not elsewhere classified: Secondary | ICD-10-CM | POA: Diagnosis not present

## 2018-01-16 HISTORY — DX: Anxiety disorder, unspecified: F41.9

## 2018-01-16 HISTORY — DX: Peripheral vascular disease, unspecified: I73.9

## 2018-01-16 NOTE — Pre-Procedure Instructions (Signed)
4 attempts at blood draw were unsuccessful.Patient refused further sticks. Will need BUN Creatinine and K on day of procedure.

## 2018-01-16 NOTE — Telephone Encounter (Signed)
Lucky Cowboy has been informed

## 2018-01-17 MED ORDER — CLINDAMYCIN PHOSPHATE 300 MG/50ML IV SOLN
300.0000 mg | Freq: Once | INTRAVENOUS | Status: AC
  Administered 2018-01-18: 300 mg via INTRAVENOUS

## 2018-01-18 ENCOUNTER — Ambulatory Visit
Admission: RE | Admit: 2018-01-18 | Discharge: 2018-01-18 | Disposition: A | Discharge status: Home / Self Care | Payer: Medicare Other | Source: Ambulatory Visit | Attending: Vascular Surgery | Admitting: Vascular Surgery

## 2018-01-18 ENCOUNTER — Encounter
Admission: RE | Disposition: A | Discharge status: Home / Self Care | Payer: Self-pay | Source: Ambulatory Visit | Attending: Vascular Surgery

## 2018-01-18 ENCOUNTER — Telehealth (INDEPENDENT_AMBULATORY_CARE_PROVIDER_SITE_OTHER): Payer: Self-pay | Admitting: Vascular Surgery

## 2018-01-18 ENCOUNTER — Encounter: Payer: Self-pay | Admitting: *Deleted

## 2018-01-18 DIAGNOSIS — Z8249 Family history of ischemic heart disease and other diseases of the circulatory system: Secondary | ICD-10-CM | POA: Insufficient documentation

## 2018-01-18 DIAGNOSIS — I70235 Atherosclerosis of native arteries of right leg with ulceration of other part of foot: Secondary | ICD-10-CM | POA: Diagnosis not present

## 2018-01-18 DIAGNOSIS — Z79899 Other long term (current) drug therapy: Secondary | ICD-10-CM | POA: Insufficient documentation

## 2018-01-18 DIAGNOSIS — Z9889 Other specified postprocedural states: Secondary | ICD-10-CM | POA: Insufficient documentation

## 2018-01-18 DIAGNOSIS — Z992 Dependence on renal dialysis: Secondary | ICD-10-CM | POA: Insufficient documentation

## 2018-01-18 DIAGNOSIS — N186 End stage renal disease: Secondary | ICD-10-CM | POA: Insufficient documentation

## 2018-01-18 DIAGNOSIS — L97511 Non-pressure chronic ulcer of other part of right foot limited to breakdown of skin: Secondary | ICD-10-CM | POA: Insufficient documentation

## 2018-01-18 DIAGNOSIS — I70238 Atherosclerosis of native arteries of right leg with ulceration of other part of lower right leg: Secondary | ICD-10-CM

## 2018-01-18 DIAGNOSIS — Z87891 Personal history of nicotine dependence: Secondary | ICD-10-CM | POA: Insufficient documentation

## 2018-01-18 DIAGNOSIS — L97909 Non-pressure chronic ulcer of unspecified part of unspecified lower leg with unspecified severity: Secondary | ICD-10-CM

## 2018-01-18 DIAGNOSIS — Z7902 Long term (current) use of antithrombotics/antiplatelets: Secondary | ICD-10-CM | POA: Insufficient documentation

## 2018-01-18 DIAGNOSIS — I509 Heart failure, unspecified: Secondary | ICD-10-CM | POA: Insufficient documentation

## 2018-01-18 DIAGNOSIS — I70299 Other atherosclerosis of native arteries of extremities, unspecified extremity: Secondary | ICD-10-CM

## 2018-01-18 DIAGNOSIS — I890 Lymphedema, not elsewhere classified: Secondary | ICD-10-CM | POA: Insufficient documentation

## 2018-01-18 DIAGNOSIS — E785 Hyperlipidemia, unspecified: Secondary | ICD-10-CM | POA: Insufficient documentation

## 2018-01-18 DIAGNOSIS — Z91013 Allergy to seafood: Secondary | ICD-10-CM | POA: Insufficient documentation

## 2018-01-18 DIAGNOSIS — I130 Hypertensive heart and chronic kidney disease with heart failure and stage 1 through stage 4 chronic kidney disease, or unspecified chronic kidney disease: Secondary | ICD-10-CM | POA: Insufficient documentation

## 2018-01-18 DIAGNOSIS — M109 Gout, unspecified: Secondary | ICD-10-CM | POA: Insufficient documentation

## 2018-01-18 HISTORY — PX: LOWER EXTREMITY ANGIOGRAPHY: CATH118251

## 2018-01-18 SURGERY — LOWER EXTREMITY ANGIOGRAPHY
Anesthesia: Moderate Sedation | Site: Leg Lower | Laterality: Right

## 2018-01-18 MED ORDER — HEPARIN (PORCINE) IN NACL 1000-0.9 UT/500ML-% IV SOLN
INTRAVENOUS | Status: AC
  Filled 2018-01-18: qty 1000

## 2018-01-18 MED ORDER — ONDANSETRON HCL 4 MG/2ML IJ SOLN: 4.0000 mg | Freq: Four times a day (QID) | INTRAMUSCULAR | Status: DC | PRN

## 2018-01-18 MED ORDER — SODIUM CHLORIDE 0.9 % IV SOLN: 250.0000 mL | INTRAVENOUS | Status: DC | PRN

## 2018-01-18 MED ORDER — SODIUM CHLORIDE 0.9 % IV SOLN: INTRAVENOUS | Status: DC

## 2018-01-18 MED ORDER — SODIUM CHLORIDE 0.9 % IJ SOLN
INTRAMUSCULAR | Status: AC
  Filled 2018-01-18: qty 50

## 2018-01-18 MED ORDER — SODIUM CHLORIDE 0.9% FLUSH: 3.0000 mL | Freq: Two times a day (BID) | INTRAVENOUS | Status: DC

## 2018-01-18 MED ORDER — HYDROMORPHONE HCL 1 MG/ML IJ SOLN: 1.0000 mg | Freq: Once | INTRAMUSCULAR | Status: DC | PRN

## 2018-01-18 MED ORDER — FENTANYL CITRATE (PF) 100 MCG/2ML IJ SOLN
INTRAMUSCULAR | Status: AC
  Filled 2018-01-18: qty 2

## 2018-01-18 MED ORDER — ACETAMINOPHEN 325 MG PO TABS: 650.0000 mg | ORAL_TABLET | ORAL | Status: DC | PRN

## 2018-01-18 MED ORDER — METHYLPREDNISOLONE SODIUM SUCC 125 MG IJ SOLR
125.0000 mg | Freq: Once | INTRAMUSCULAR | Status: AC
  Administered 2018-01-18: 125 mg via INTRAVENOUS

## 2018-01-18 MED ORDER — METHYLPREDNISOLONE SODIUM SUCC 125 MG IJ SOLR
INTRAMUSCULAR | Status: AC
  Administered 2018-01-18: 125 mg via INTRAVENOUS
  Filled 2018-01-18: qty 2

## 2018-01-18 MED ORDER — ASPIRIN EC 81 MG PO TBEC: 81.0000 mg | DELAYED_RELEASE_TABLET | Freq: Every day | ORAL | Status: DC

## 2018-01-18 MED ORDER — ASPIRIN EC 81 MG PO TBEC: 81.0000 mg | DELAYED_RELEASE_TABLET | Freq: Every day | ORAL | 2 refills | Status: DC

## 2018-01-18 MED ORDER — LABETALOL HCL 5 MG/ML IV SOLN
INTRAVENOUS | Status: AC
  Filled 2018-01-18: qty 4

## 2018-01-18 MED ORDER — LABETALOL HCL 5 MG/ML IV SOLN: 10.0000 mg | INTRAVENOUS | Status: DC | PRN

## 2018-01-18 MED ORDER — FENTANYL CITRATE (PF) 100 MCG/2ML IJ SOLN
INTRAMUSCULAR | Status: DC | PRN
  Administered 2018-01-18: 50 ug via INTRAVENOUS
  Administered 2018-01-18: 25 ug via INTRAVENOUS
  Administered 2018-01-18 (×2): 50 ug via INTRAVENOUS

## 2018-01-18 MED ORDER — FAMOTIDINE 20 MG PO TABS
40.0000 mg | ORAL_TABLET | Freq: Once | ORAL | Status: AC
  Administered 2018-01-18: 40 mg via ORAL

## 2018-01-18 MED ORDER — SODIUM CHLORIDE 0.9% FLUSH: 3.0000 mL | INTRAVENOUS | Status: DC | PRN

## 2018-01-18 MED ORDER — DIPHENHYDRAMINE HCL 25 MG PO CAPS
ORAL_CAPSULE | ORAL | Status: AC
  Filled 2018-01-18: qty 2

## 2018-01-18 MED ORDER — MIDAZOLAM HCL 2 MG/2ML IJ SOLN
INTRAMUSCULAR | Status: AC
  Filled 2018-01-18: qty 2

## 2018-01-18 MED ORDER — IOPAMIDOL (ISOVUE-300) INJECTION 61%
INTRAVENOUS | Status: DC | PRN
  Administered 2018-01-18: 65 mL via INTRA_ARTERIAL

## 2018-01-18 MED ORDER — LIDOCAINE-EPINEPHRINE (PF) 1 %-1:200000 IJ SOLN
INTRAMUSCULAR | Status: AC
  Filled 2018-01-18: qty 30

## 2018-01-18 MED ORDER — NITROGLYCERIN 1 MG/10 ML FOR IR/CATH LAB
INTRA_ARTERIAL | Status: DC | PRN
  Administered 2018-01-18: 500 ug

## 2018-01-18 MED ORDER — HEPARIN SODIUM (PORCINE) 1000 UNIT/ML IJ SOLN
INTRAMUSCULAR | Status: AC
  Filled 2018-01-18: qty 1

## 2018-01-18 MED ORDER — MIDAZOLAM HCL 5 MG/5ML IJ SOLN
INTRAMUSCULAR | Status: AC
  Filled 2018-01-18: qty 5

## 2018-01-18 MED ORDER — MIDAZOLAM HCL 2 MG/2ML IJ SOLN
INTRAMUSCULAR | Status: DC | PRN
  Administered 2018-01-18 (×2): 1 mg via INTRAVENOUS
  Administered 2018-01-18 (×2): 2 mg via INTRAVENOUS

## 2018-01-18 MED ORDER — DIPHENHYDRAMINE HCL 50 MG/ML IJ SOLN: 50.0000 mg | Freq: Once | INTRAMUSCULAR | Status: AC

## 2018-01-18 MED ORDER — HYDRALAZINE HCL 20 MG/ML IJ SOLN: 5.0000 mg | INTRAMUSCULAR | Status: DC | PRN

## 2018-01-18 MED ORDER — ASPIRIN 81 MG PO CHEW
CHEWABLE_TABLET | ORAL | Status: AC
  Administered 2018-01-18: 81 mg
  Filled 2018-01-18: qty 1

## 2018-01-18 MED ORDER — DIPHENHYDRAMINE HCL 25 MG PO CAPS
50.0000 mg | ORAL_CAPSULE | Freq: Once | ORAL | Status: AC
  Administered 2018-01-18: 50 mg via ORAL

## 2018-01-18 MED ORDER — HEPARIN SODIUM (PORCINE) 1000 UNIT/ML IJ SOLN
INTRAMUSCULAR | Status: DC | PRN
  Administered 2018-01-18: 5000 [IU] via INTRAVENOUS

## 2018-01-18 MED ORDER — CLINDAMYCIN PHOSPHATE 300 MG/50ML IV SOLN
INTRAVENOUS | Status: AC
  Administered 2018-01-18: 300 mg via INTRAVENOUS
  Filled 2018-01-18: qty 50

## 2018-01-18 MED ORDER — FAMOTIDINE 20 MG PO TABS
ORAL_TABLET | ORAL | Status: AC
  Filled 2018-01-18: qty 2

## 2018-01-18 MED ORDER — LABETALOL HCL 5 MG/ML IV SOLN
INTRAVENOUS | Status: DC | PRN
  Administered 2018-01-18 (×2): 20 mg via INTRAVENOUS

## 2018-01-18 SURGICAL SUPPLY — 28 items
BALLN DORADO 5X200X135 (BALLOONS) ×2
BALLN LUTONIX 5X220X130 (BALLOONS) ×4
BALLN ULTRVRSE 3X300X150 (BALLOONS) ×1
BALLN ULTRVRSE 3X300X150 OTW (BALLOONS) ×1
BALLOON DORADO 5X200X135 (BALLOONS) ×1 IMPLANT
BALLOON LUTONIX 5X220X130 (BALLOONS) ×2 IMPLANT
BALLOON ULTRVRSE 3X300X150 OTW (BALLOONS) ×1 IMPLANT
CATH BEACON 5 .035 100 C2 TIP (CATHETERS) ×2 IMPLANT
CATH BEACON 5 .035 65 RIM TIP (CATHETERS) ×2 IMPLANT
CATH BEACON 5 .038 100 VERT TP (CATHETERS) ×2 IMPLANT
CATH CXI SUPP ANG 4FR 135 (CATHETERS) ×1 IMPLANT
CATH CXI SUPP ANG 4FR 135CM (CATHETERS) ×2
COVER PROBE U/S 5X48 (MISCELLANEOUS) ×2 IMPLANT
DEVICE PRESTO INFLATION (MISCELLANEOUS) ×2 IMPLANT
DEVICE SAFEGUARD 24CM (GAUZE/BANDAGES/DRESSINGS) ×2 IMPLANT
DEVICE STARCLOSE SE CLOSURE (Vascular Products) ×2 IMPLANT
DEVICE TORQUE .014-.018 (MISCELLANEOUS) ×1 IMPLANT
DEVICE TORQUE .025-.038 (MISCELLANEOUS) ×2 IMPLANT
GLIDEWIRE ADV .035X260CM (WIRE) ×2 IMPLANT
PACK ANGIOGRAPHY (CUSTOM PROCEDURE TRAY) ×2 IMPLANT
PREP CHG 10.5 TEAL (MISCELLANEOUS) ×2 IMPLANT
SHEATH ANL2 6FRX45 HC (SHEATH) ×2 IMPLANT
SHEATH BRITE TIP 6FRX11 (SHEATH) ×2 IMPLANT
STENT VIABAHN 6X150X120 (Permanent Stent) ×2 IMPLANT
TORQUE DEVICE .014-.018 (MISCELLANEOUS) ×2
TOWEL OR 17X26 4PK STRL BLUE (TOWEL DISPOSABLE) ×2 IMPLANT
WIRE G V18X300CM (WIRE) ×2 IMPLANT
WIRE J 3MM .035X145CM (WIRE) ×2 IMPLANT

## 2018-01-18 NOTE — Discharge Instructions (Signed)

## 2018-01-18 NOTE — H&P (Signed)
[facility] History & Physical Update  The patient was interviewed and re-examined.  The patient's previous History and Physical has been reviewed and is unchanged.  There is no change in the plan of care. We plan to proceed with the scheduled procedure.  Leotis Pain, MD  01/18/2018, 9:42 AM

## 2018-01-18 NOTE — Progress Notes (Signed)
Friend Lauralyn Primes) does not want to take the patient home that there is no one to care for him over night. She is requesting he be admitted.,Dr. Lucky Cowboy notified and stated he had. spoken  to her post  procedure and there was no medical reason to admit.  Discharged home with friend, taken out by wheelchair.

## 2018-01-18 NOTE — Op Note (Signed)
[facility]  Percutaneous Study/Intervention Procedural Note   Date of Surgery: 01/18/2018  Surgeon(s):Burech Mcfarland    Assistants:none  Pre-operative Diagnosis: PAD with ulceration RLE  Post-operative diagnosis:  Same  Procedure(s) Performed:             1.  Ultrasound guidance for vascular access left femoral artery             2.  Catheter placement into right peroneal artery and anterior tibial artery             3.  Aortogram and selective right lower extremity angiogram             4.  Percutaneous transluminal angioplasty of right peroneal artery and TP trunk with 3 mm x 30 cm angioplasty balloon             5.  Percutaneous transluminal angioplasty of right anterior tibial artery with 3 mm x 30 cm angioplasty balloon  6.  Percutaneous transluminal angioplasty of the entire right SFA and above-knee popliteal artery with two 5 mm diameter by 22 cm length Lutonix drug-coated angioplasty balloons  7.  Viabahn stent placement to the right above-knee popliteal artery and distal SFA for greater than 50% residual stenosis in these locations with a 6 mm diameter by 15 cm length stent             8.  StarClose closure device left femoral artery  EBL: 10 cc  Contrast: 65 cc  Fluoro Time: 14 minutes  Moderate Conscious Sedation Time: approximately 75 minutes using 6 mg of Versed and 175 Mcg of Fentanyl              Indications:  Patient is a 67 y.o.male with known severe peripheral arterial disease and nonhealing ulcerations on the right foot which are worsening.  He has already undergone left lower extremity intervention. The patient is brought in for angiography for further evaluation and potential treatment.  Due to the limb threatening nature of the situation, angiogram was performed for attempted limb salvage. The patient is aware that if the procedure fails, amputation would be expected.  The patient also understands that even with successful revascularization,  amputation may still be required due to the severity of the situation. Risks and benefits are discussed and informed consent is obtained.   Procedure:  The patient was identified and appropriate procedural time out was performed.  The patient was then placed supine on the table and prepped and draped in the usual sterile fashion. Moderate conscious sedation was administered during a face to face encounter with the patient throughout the procedure with my supervision of the RN administering medicines and monitoring the patient's vital signs, pulse oximetry, telemetry and mental status throughout from the start of the procedure until the patient was taken to the recovery room. Ultrasound was used to evaluate the left common femoral artery.  It was patent .  A digital ultrasound image was acquired.  A Seldinger needle was used to access the left common femoral artery under direct ultrasound guidance and a permanent image was performed.  A 0.035 J wire was advanced without resistance and a 5Fr sheath was placed.   Aortogram was not performed as one was done within the past several months and no intervention was required in these locations.  I then crossed the aortic bifurcation and advanced to the right femoral head. Selective right lower extremity angiogram was then performed. This demonstrated calcific vessels with no significant stenosis  in the common femoral artery but a very small diseased profunda femoris artery.  The superficial femoral artery was diffusely diseased with about a 70% stenosis proximally, a 60 to 70% stenosis in the proximal to mid segment, and then an occlusion in the mid to distal superficial femoral artery and above-knee popliteal artery.  The below-knee popliteal artery normalized to a typical tibial trifurcation.  The peroneal artery had dense disease in the proximal and mid segments with a short segment occlusion.  The anterior tibial artery had about a 70% stenosis near its origin and then  about a 70 to 80% stenosis in the mid segment.  The posterior tibial artery was chronically occluded. The patient was systemically heparinized and a 6 [place] [employer] sheath was then placed over the [employer] wire. I then used a Kumpe catheter and the advantage wire to navigate through the SFA and popliteal occlusions and confirm intraluminal flow in the below-knee popliteal artery.  I then exchanged for a 0.018 wire and a CXI catheter and proceeded to cross the peroneal artery stenoses and occlusions and confirm intraluminal flow in the mid to distal peroneal artery below the disease.  I then elected to proceed with treatment at this setting.  The diagnostic catheter was removed and the 0.018 wire was replaced.  A 3 mm diameter by 30 cm length angioplasty balloon was then inflated to 10 atm in the tibioperoneal trunk and proximal and mid peroneal arteries to encompass the disease.  To treat the SFA and popliteal arteries, I used a pair of 5 mm diameter by 22 cm length Lutonix drug-coated angioplasty balloons.  These were inflated both 8 to 10 atm for a minute.  Completion imaging showed less than 30% residual stenosis of the peroneal artery and tibioperoneal trunk.  The majority of the SFA intervention had less than 20% stenosis but the distal SFA and above-knee popliteal artery had 2 areas of residual greater than 50% stenosis.  I also wanted to improve his distal runoff and using a C2 catheter and the advantage wire was able to cannulate the anterior tibial artery.  I then exchanged for a 0.018 wire and crossed both areas of stenosis in the anterior tibial artery.  The 3 mm diameter by 30 cm length angioplasty balloon was inflated in the proximal and mid anterior tibial arteries down to the mid to distal anterior tibial artery to encompass both lesions.  This was inflated to 6 atm for 1 minute.  Completion imaging showed less than 25% residual stenosis in the anterior tibial artery.  I then placed a 6 mm  diameter by 15 cm length Viabahn stent in the above-knee popliteal artery up to the distal SFA and postdilated this with a 5 mm diameter high-pressure angioplasty balloon.  Completion imaging of the distal SFA and above-knee popliteal artery showed excellent flow with less than 10% residual stenosis. I elected to terminate the procedure. The sheath was removed and StarClose closure device was deployed in the left femoral artery with excellent hemostatic result. The patient was taken to the recovery room in stable condition having tolerated the procedure well.  Findings:                            Right Lower Extremity:  Highly calcific vessels with no significant stenosis in the common femoral artery but a very small diseased profunda femoris artery.  The superficial femoral artery was diffusely diseased with about a 70% stenosis  proximally, a 60 to 70% stenosis in the proximal to mid segment, and then an occlusion in the mid to distal superficial femoral artery and above-knee popliteal artery.  The below-knee popliteal artery normalized to a typical tibial trifurcation.  The peroneal artery had dense disease in the proximal and mid segments with a short segment occlusion.  The anterior tibial artery had about a 70% stenosis near its origin and then about a 70 to 80% stenosis in the mid segment.  The posterior tibial artery was chronically occluded.   Disposition: Patient was taken to the recovery room in stable condition having tolerated the procedure well.  Complications: None  Leotis Pain 01/18/2018 12:20 PM   This note was created with Dragon Medical transcription system. Any errors in dictation are purely unintentional.

## 2018-01-19 ENCOUNTER — Telehealth (INDEPENDENT_AMBULATORY_CARE_PROVIDER_SITE_OTHER): Payer: Self-pay

## 2018-01-19 NOTE — Telephone Encounter (Signed)
I called Johnny [name] back to give her the new order of the wet-to-dry daily changes to the feet bilaterally daily. I am also going to fax her the same orders over to the office.

## 2018-01-19 NOTE — Telephone Encounter (Signed)
Nurse called to see if there are any specific orders that you would like carried out with this patient since he's had the stents placed in his legs or if he'll need anything at all?  Dressing change? Unna boot? And she would like to know what to use and the duration.

## 2018-01-20 ENCOUNTER — Observation Stay
Admission: EM | Admit: 2018-01-20 | Discharge: 2018-01-24 | Disposition: A | Discharge status: Skilled Nursing Facility | Payer: Medicare Other | Attending: Internal Medicine | Admitting: Internal Medicine

## 2018-01-20 ENCOUNTER — Observation Stay: Payer: Medicare Other

## 2018-01-20 ENCOUNTER — Other Ambulatory Visit: Payer: Self-pay

## 2018-01-20 ENCOUNTER — Emergency Department: Payer: Medicare Other

## 2018-01-20 DIAGNOSIS — I472 Ventricular tachycardia, unspecified: Secondary | ICD-10-CM

## 2018-01-20 DIAGNOSIS — M109 Gout, unspecified: Secondary | ICD-10-CM | POA: Diagnosis not present

## 2018-01-20 DIAGNOSIS — I132 Hypertensive heart and chronic kidney disease with heart failure and with stage 5 chronic kidney disease, or end stage renal disease: Secondary | ICD-10-CM | POA: Insufficient documentation

## 2018-01-20 DIAGNOSIS — D72829 Elevated white blood cell count, unspecified: Secondary | ICD-10-CM | POA: Diagnosis not present

## 2018-01-20 DIAGNOSIS — I5032 Chronic diastolic (congestive) heart failure: Secondary | ICD-10-CM | POA: Insufficient documentation

## 2018-01-20 DIAGNOSIS — R748 Abnormal levels of other serum enzymes: Secondary | ICD-10-CM | POA: Diagnosis not present

## 2018-01-20 DIAGNOSIS — E876 Hypokalemia: Secondary | ICD-10-CM | POA: Insufficient documentation

## 2018-01-20 DIAGNOSIS — Z87891 Personal history of nicotine dependence: Secondary | ICD-10-CM | POA: Insufficient documentation

## 2018-01-20 DIAGNOSIS — R531 Weakness: Secondary | ICD-10-CM | POA: Diagnosis present

## 2018-01-20 DIAGNOSIS — Z79899 Other long term (current) drug therapy: Secondary | ICD-10-CM | POA: Insufficient documentation

## 2018-01-20 DIAGNOSIS — I739 Peripheral vascular disease, unspecified: Secondary | ICD-10-CM | POA: Insufficient documentation

## 2018-01-20 DIAGNOSIS — N2581 Secondary hyperparathyroidism of renal origin: Secondary | ICD-10-CM | POA: Diagnosis not present

## 2018-01-20 DIAGNOSIS — Z7902 Long term (current) use of antithrombotics/antiplatelets: Secondary | ICD-10-CM | POA: Diagnosis not present

## 2018-01-20 DIAGNOSIS — E785 Hyperlipidemia, unspecified: Secondary | ICD-10-CM | POA: Insufficient documentation

## 2018-01-20 DIAGNOSIS — R29898 Other symptoms and signs involving the musculoskeletal system: Secondary | ICD-10-CM | POA: Diagnosis present

## 2018-01-20 DIAGNOSIS — Z992 Dependence on renal dialysis: Secondary | ICD-10-CM | POA: Insufficient documentation

## 2018-01-20 DIAGNOSIS — L97519 Non-pressure chronic ulcer of other part of right foot with unspecified severity: Secondary | ICD-10-CM | POA: Diagnosis not present

## 2018-01-20 DIAGNOSIS — R2689 Other abnormalities of gait and mobility: Secondary | ICD-10-CM | POA: Diagnosis not present

## 2018-01-20 DIAGNOSIS — I248 Other forms of acute ischemic heart disease: Secondary | ICD-10-CM | POA: Insufficient documentation

## 2018-01-20 DIAGNOSIS — N186 End stage renal disease: Secondary | ICD-10-CM | POA: Insufficient documentation

## 2018-01-20 DIAGNOSIS — D631 Anemia in chronic kidney disease: Secondary | ICD-10-CM | POA: Diagnosis not present

## 2018-01-20 LAB — COMPREHENSIVE METABOLIC PANEL
ALT: 8 U/L (ref 0–44)
AST: 26 U/L (ref 15–41)
Albumin: 3.7 g/dL (ref 3.5–5.0)
Alkaline Phosphatase: 55 U/L (ref 38–126)
Anion gap: 13 (ref 5–15)
BUN: 17 mg/dL (ref 8–23)
CHLORIDE: 98 mmol/L (ref 98–111)
CO2: 29 mmol/L (ref 22–32)
CREATININE: 5.66 mg/dL — AB (ref 0.61–1.24)
Calcium: 10.3 mg/dL (ref 8.9–10.3)
GFR calc Af Amer: 11 mL/min — ABNORMAL LOW (ref >60)
GFR calc non Af Amer: 9 mL/min — ABNORMAL LOW (ref >60)
Glucose, Bld: 90 mg/dL (ref 70–99)
POTASSIUM: 3 mmol/L — AB (ref 3.5–5.1)
SODIUM: 140 mmol/L (ref 135–145)
Total Bilirubin: 0.6 mg/dL (ref 0.3–1.2)
Total Protein: 7.9 g/dL (ref 6.5–8.1)

## 2018-01-20 LAB — TROPONIN I
TROPONIN I: 0.17 ng/mL — AB (ref <0.03)
TROPONIN I: 0.17 ng/mL — AB (ref <0.03)
Troponin I: 0.17 ng/mL (ref <0.03)

## 2018-01-20 LAB — GLUCOSE, CAPILLARY: GLUCOSE-CAPILLARY: 68 mg/dL — AB (ref 70–99)

## 2018-01-20 LAB — MRSA PCR SCREENING: MRSA BY PCR: NEGATIVE

## 2018-01-20 LAB — CBC
HEMATOCRIT: 36.4 % — AB (ref 40.0–52.0)
Hemoglobin: 11.7 g/dL — ABNORMAL LOW (ref 13.0–18.0)
MCH: 31.1 pg (ref 26.0–34.0)
MCHC: 32.1 g/dL (ref 32.0–36.0)
MCV: 97 fL (ref 80.0–100.0)
PLATELETS: 205 10*3/uL (ref 150–440)
RBC: 3.75 MIL/uL — ABNORMAL LOW (ref 4.40–5.90)
RDW: 15.3 % — AB (ref 11.5–14.5)
WBC: 11.5 10*3/uL — ABNORMAL HIGH (ref 3.8–10.6)

## 2018-01-20 MED ORDER — IRBESARTAN 150 MG PO TABS
300.0000 mg | ORAL_TABLET | Freq: Every day | ORAL | Status: DC
  Administered 2018-01-20 – 2018-01-24 (×4): 300 mg via ORAL
  Filled 2018-01-20 (×4): qty 2

## 2018-01-20 MED ORDER — CLOPIDOGREL BISULFATE 75 MG PO TABS
75.0000 mg | ORAL_TABLET | Freq: Every day | ORAL | Status: DC
  Administered 2018-01-20 – 2018-01-24 (×5): 75 mg via ORAL
  Filled 2018-01-20 (×5): qty 1

## 2018-01-20 MED ORDER — RENA-VITE PO TABS
1.0000 | ORAL_TABLET | Freq: Every day | ORAL | Status: DC
  Administered 2018-01-20 – 2018-01-23 (×4): 1 via ORAL
  Filled 2018-01-20 (×5): qty 1

## 2018-01-20 MED ORDER — AMLODIPINE BESYLATE 10 MG PO TABS
10.0000 mg | ORAL_TABLET | Freq: Every day | ORAL | Status: DC
  Administered 2018-01-20 – 2018-01-24 (×4): 10 mg via ORAL
  Filled 2018-01-20 (×4): qty 1

## 2018-01-20 MED ORDER — ACETAMINOPHEN 325 MG PO TABS
650.0000 mg | ORAL_TABLET | Freq: Four times a day (QID) | ORAL | Status: DC | PRN
  Administered 2018-01-22: 650 mg via ORAL
  Filled 2018-01-20: qty 2

## 2018-01-20 MED ORDER — GABAPENTIN 100 MG PO CAPS: 200.0000 mg | ORAL_CAPSULE | Freq: Two times a day (BID) | ORAL | Status: DC

## 2018-01-20 MED ORDER — HEPARIN SODIUM (PORCINE) 5000 UNIT/ML IJ SOLN
5000.0000 [IU] | Freq: Three times a day (TID) | INTRAMUSCULAR | Status: DC
  Administered 2018-01-20 – 2018-01-24 (×12): 5000 [IU] via SUBCUTANEOUS
  Filled 2018-01-20 (×12): qty 1

## 2018-01-20 MED ORDER — OCUVITE-LUTEIN PO CAPS
1.0000 | ORAL_CAPSULE | Freq: Every day | ORAL | Status: DC
  Administered 2018-01-21 – 2018-01-23 (×3): 1 via ORAL
  Filled 2018-01-20 (×4): qty 1

## 2018-01-20 MED ORDER — NEPRO/CARBSTEADY PO LIQD
237.0000 mL | Freq: Two times a day (BID) | ORAL | Status: DC
  Administered 2018-01-20 – 2018-01-24 (×7): 237 mL via ORAL

## 2018-01-20 MED ORDER — POLYETHYLENE GLYCOL 3350 17 G PO PACK
17.0000 g | PACK | Freq: Every day | ORAL | Status: DC | PRN
  Filled 2018-01-20: qty 1

## 2018-01-20 MED ORDER — ALLOPURINOL 100 MG PO TABS
100.0000 mg | ORAL_TABLET | Freq: Every day | ORAL | Status: DC
  Administered 2018-01-20 – 2018-01-24 (×5): 100 mg via ORAL
  Filled 2018-01-20 (×5): qty 1

## 2018-01-20 MED ORDER — ACETAMINOPHEN 650 MG RE SUPP: 650.0000 mg | Freq: Four times a day (QID) | RECTAL | Status: DC | PRN

## 2018-01-20 MED ORDER — ASPIRIN EC 81 MG PO TBEC
81.0000 mg | DELAYED_RELEASE_TABLET | Freq: Every day | ORAL | Status: DC
  Administered 2018-01-20 – 2018-01-24 (×5): 81 mg via ORAL
  Filled 2018-01-20 (×5): qty 1

## 2018-01-20 MED ORDER — FUROSEMIDE 40 MG PO TABS
40.0000 mg | ORAL_TABLET | ORAL | Status: DC
  Administered 2018-01-21 – 2018-01-23 (×2): 40 mg via ORAL
  Filled 2018-01-20 (×2): qty 1

## 2018-01-20 MED ORDER — MORPHINE SULFATE (PF) 2 MG/ML IV SOLN
2.0000 mg | INTRAVENOUS | Status: DC | PRN
  Administered 2018-01-20 – 2018-01-21 (×4): 2 mg via INTRAVENOUS
  Filled 2018-01-20 (×5): qty 1

## 2018-01-20 MED ORDER — POTASSIUM CHLORIDE CRYS ER 20 MEQ PO TBCR
40.0000 meq | EXTENDED_RELEASE_TABLET | Freq: Once | ORAL | Status: AC
  Administered 2018-01-20: 40 meq via ORAL
  Filled 2018-01-20: qty 2

## 2018-01-20 MED ORDER — CALCIUM CARBONATE ANTACID 500 MG PO CHEW
200.0000 mg | CHEWABLE_TABLET | Freq: Three times a day (TID) | ORAL | Status: DC
  Administered 2018-01-20 – 2018-01-24 (×11): 200 mg via ORAL
  Filled 2018-01-20 (×11): qty 1

## 2018-01-20 MED ORDER — ONDANSETRON HCL 4 MG PO TABS: 4.0000 mg | ORAL_TABLET | Freq: Four times a day (QID) | ORAL | Status: DC | PRN

## 2018-01-20 MED ORDER — ATORVASTATIN CALCIUM 10 MG PO TABS
10.0000 mg | ORAL_TABLET | Freq: Every day | ORAL | Status: DC
  Administered 2018-01-20 – 2018-01-24 (×4): 10 mg via ORAL
  Filled 2018-01-20 (×5): qty 1

## 2018-01-20 MED ORDER — CLONAZEPAM 0.5 MG PO TABS
0.2500 mg | ORAL_TABLET | Freq: Every evening | ORAL | Status: DC | PRN
  Administered 2018-01-20: 0.25 mg via ORAL
  Filled 2018-01-20: qty 1

## 2018-01-20 MED ORDER — DIALYVITE 3000 3 MG PO TABS: 1.0000 | ORAL_TABLET | Freq: Every day | ORAL | Status: DC

## 2018-01-20 MED ORDER — HYDRALAZINE HCL 25 MG PO TABS
25.0000 mg | ORAL_TABLET | Freq: Every day | ORAL | Status: DC
  Administered 2018-01-20 – 2018-01-24 (×4): 25 mg via ORAL
  Filled 2018-01-20 (×4): qty 1

## 2018-01-20 MED ORDER — PREGABALIN 50 MG PO CAPS: 50.0000 mg | ORAL_CAPSULE | Freq: Every day | ORAL | Status: DC

## 2018-01-20 MED ORDER — FAMOTIDINE 20 MG PO TABS
20.0000 mg | ORAL_TABLET | Freq: Every day | ORAL | Status: DC
  Administered 2018-01-20 – 2018-01-24 (×5): 20 mg via ORAL
  Filled 2018-01-20 (×5): qty 1

## 2018-01-20 MED ORDER — LABETALOL HCL 5 MG/ML IV SOLN: 10.0000 mg | INTRAVENOUS | Status: DC | PRN

## 2018-01-20 MED ORDER — ONDANSETRON HCL 4 MG/2ML IJ SOLN: 4.0000 mg | Freq: Four times a day (QID) | INTRAMUSCULAR | Status: DC | PRN

## 2018-01-20 MED ORDER — CALCIUM GLUCONATE 50 MG PO TABS: 50.0000 mg | ORAL_TABLET | Freq: Three times a day (TID) | ORAL | Status: DC

## 2018-01-20 NOTE — ED Notes (Signed)
Pt had 4 beat run of V tach. Strip printed and given to Dr. Kerman Passey.

## 2018-01-20 NOTE — ED Notes (Signed)
Hunter, RN to transport pt to the floor

## 2018-01-20 NOTE — ED Triage Notes (Signed)
Pt presetns today via [facility] from home. Pt is a dialysis pt and goes MWF. Pt presents weakness. Pt had a catheratzation on the Left leg last weeka nd now has a knot on the right knee. Pt i sA/Ox4 180/81,98 HR, 99% RA.

## 2018-01-20 NOTE — ED Notes (Signed)
Hospitalist at bedside seeing pt

## 2018-01-20 NOTE — H&P (Addendum)
[facility] [place] NAME: Johnny [name]    MR#:  [id number]  DATE OF BIRTH:  [birth year]  DATE OF ADMISSION:  01/20/2018  PRIMARY CARE PHYSICIAN: [name], MD   REQUESTING/REFERRING PHYSICIAN: Harvest Dark, MD  CHIEF COMPLAINT:   Chief Complaint  Patient presents with  . Weakness    HISTORY OF PRESENT ILLNESS:  Johnny [name]  is a 67 y.o. male with a known history of HTN, chronic diastolic heart failure, ESRD on HD, HLD, PAD, anxiety presenting to the ED with generalized weakness starting today. Patient just underwent right lower extremity angiography on 01/18/18 due to a chronic ulcer of the right 5th toe. Per his caregiver, he had generalized weakness and some mild confusion this morning. He denies any chest pain, shortness of breath, nausea, or diaphoresis. At baseline, he ambulates with a cane.  In the ED, labs were significant for trop 0.17. EKG without acute ischemic changes. CXR negative. He did have a 5 beat run of v-tach. Hospitalists were called for admission.  PAST MEDICAL HISTORY:   Past Medical History:  Diagnosis Date  . Anemia   . Anxiety   . CHF (congestive heart failure) ([place])   . Chronic kidney disease   . Gout   . Hyperlipidemia   . Hypertension   . Peripheral vascular disease ([place])     PAST SURGICAL HISTORY:   Past Surgical History:  Procedure Laterality Date  . A/V SHUNTOGRAM Left 06/21/2017   Procedure: A/V SHUNTOGRAM;  Surgeon: Katha Cabal, MD;  Location: [place] [facility];  Service: Cardiovascular;  Laterality: Left;  . AV FISTULA PLACEMENT Left 09/18/2015   Procedure: INSERTION OF ARTERIOVENOUS (AV) GORE-TEX GRAFT ARM ( BRACH/AXILLARY GRAFT W/ INSTANT STICK GRAFT );  Surgeon: Katha Cabal, MD;  Location: [facility] ORS;  Service: Vascular;  Laterality: Left;  . DIALYSIS FISTULA CREATION    . ESOPHAGOGASTRODUODENOSCOPY N/A 12/19/2017   Procedure: ESOPHAGOGASTRODUODENOSCOPY (EGD);   Surgeon: Lin Landsman, MD;  Location: [facility] ENDOSCOPY;  Service: Gastroenterology;  Laterality: N/A;  . LOWER EXTREMITY ANGIOGRAPHY Left 11/16/2017   Procedure: LOWER EXTREMITY ANGIOGRAPHY;  Surgeon: Algernon Huxley, MD;  Location: [place] [facility];  Service: Cardiovascular;  Laterality: Left;  . LOWER EXTREMITY ANGIOGRAPHY Right 01/18/2018   Procedure: LOWER EXTREMITY ANGIOGRAPHY;  Surgeon: Algernon Huxley, MD;  Location: [place] [facility];  Service: Cardiovascular;  Laterality: Right;  . PERIPHERAL VASCULAR CATHETERIZATION Left 09/01/2015   Procedure: A/V Shuntogram/Fistulagram;  Surgeon: Katha Cabal, MD;  Location: [place] [facility];  Service: Cardiovascular;  Laterality: Left;  . PERIPHERAL VASCULAR CATHETERIZATION N/A 09/30/2015   Procedure: A/V Shuntogram/Fistulagram with perm cathether removal;  Surgeon: Algernon Huxley, MD;  Location: [place] [facility];  Service: Cardiovascular;  Laterality: N/A;  . PERIPHERAL VASCULAR CATHETERIZATION Left 09/30/2015   Procedure: A/V Shunt Intervention;  Surgeon: Algernon Huxley, MD;  Location: [place] [facility];  Service: Cardiovascular;  Laterality: Left;  . PERIPHERAL VASCULAR CATHETERIZATION Left 12/03/2015   Procedure: Thrombectomy;  Surgeon: Algernon Huxley, MD;  Location: [place] [facility];  Service: Cardiovascular;  Laterality: Left;  . PERIPHERAL VASCULAR CATHETERIZATION Left 01/28/2016   Procedure: Thrombectomy;  Surgeon: Algernon Huxley, MD;  Location: [place] [facility];  Service: Cardiovascular;  Laterality: Left;  . PERIPHERAL VASCULAR CATHETERIZATION N/A 01/28/2016   Procedure: A/V Shuntogram/Fistulagram;  Surgeon: Algernon Huxley, MD;  Location: [place] [facility];  Service: Cardiovascular;  Laterality:  N/A;    SOCIAL HISTORY:   Social History   Tobacco Use  . Smoking status: Former Research scientist (life sciences)  . Smokeless tobacco: Never Used  Substance Use Topics  . Alcohol use: No    FAMILY HISTORY:   Family History  Problem Relation Age of  Onset  . Hypertension Unknown   . Heart disease Unknown     DRUG ALLERGIES:   Allergies  Allergen Reactions  . Shellfish Allergy Anaphylaxis    REVIEW OF SYSTEMS:   Review of Systems  Constitutional: Negative for chills and fever.  HENT: Negative for congestion and sore throat.   Eyes: Negative for blurred vision and double vision.  Respiratory: Negative for cough and shortness of breath.   Cardiovascular: Negative for chest pain, palpitations and leg swelling.  Gastrointestinal: Negative for abdominal pain, nausea and vomiting.  Genitourinary: Negative for dysuria and frequency.  Musculoskeletal: Negative for back pain, falls and myalgias.  Neurological: Positive for weakness. Negative for dizziness and headaches.  Psychiatric/Behavioral: Negative for depression. The patient is not nervous/anxious.       MEDICATIONS AT HOME:   Prior to Admission medications   Medication Sig Start Date End Date Taking? Authorizing Provider  allopurinol (ZYLOPRIM) 100 MG tablet Take 100 mg by mouth daily.    [provider]  amLODipine (NORVASC) 10 MG tablet Take 10 mg by mouth daily.     [provider]  aspirin EC 81 MG tablet Take 1 tablet (81 mg total) by mouth daily. Patient not taking: Reported on 01/20/2018 01/18/18   Algernon Huxley, MD  atorvastatin (LIPITOR) 10 MG tablet Take 1 tablet by mouth daily. 11/23/17   [provider]  Calcium Gluconate 50 MG TABS Take 50 mg by mouth 3 (three) times daily.  05/19/11   [provider]  clonazePAM (KLONOPIN) 0.5 MG tablet Take 0.25 mg by mouth at bedtime.     [provider]  clopidogrel (PLAVIX) 75 MG tablet Take 1 tablet (75 mg total) by mouth daily. Patient not taking: Reported on 01/20/2018 12/03/15   Algernon Huxley, MD  colchicine 0.6 MG tablet Take 0.6 mg by mouth daily as needed (for gout flares).     [provider]  collagenase (SANTYL) ointment Apply topically daily. Patient not taking:  Reported on 01/20/2018 11/30/17   Bettey Costa, MD  famotidine (PEPCID) 20 MG tablet Take 1 tablet (20 mg total) by mouth daily. Patient not taking: Reported on 01/20/2018 12/25/17   Fritzi Mandes, MD  folic acid-vitamin b complex-vitamin c-selenium-zinc (DIALYVITE) 3 MG TABS tablet Take by mouth.    [provider]  furosemide (LASIX) 40 MG tablet Take 40 mg by mouth daily.     [provider]  gabapentin (NEURONTIN) 100 MG capsule Take 200 mg by mouth 2 (two) times daily.    [provider]  hydrALAZINE (APRESOLINE) 25 MG tablet Take 25 mg by mouth daily.     [provider]  irbesartan (AVAPRO) 300 MG tablet Take 300 mg by mouth daily.     [provider]  lidocaine-prilocaine (EMLA) cream Apply 1 application topically as needed (port access).    [provider]  multivitamin (RENA-VIT) TABS tablet Take 1 tablet by mouth at bedtime. Patient not taking: Reported on 01/20/2018 12/25/17   Fritzi Mandes, MD  multivitamin-lutein [facility]) CAPS capsule Take 1 capsule by mouth daily. Patient not taking: Reported on 01/20/2018 12/25/17   Fritzi Mandes, MD  Nutritional Supplements (FEEDING SUPPLEMENT, NEPRO CARB STEADY,) LIQD  Take 237 mLs by mouth 2 (two) times daily between meals. Patient not taking: Reported on 01/20/2018 12/25/17   Fritzi Mandes, MD  ondansetron (ZOFRAN ODT) 4 MG disintegrating tablet Take 1 tablet (4 mg total) by mouth every 8 (eight) hours as needed for nausea or vomiting. Patient not taking: Reported on 01/20/2018 12/18/17   Earleen Newport, MD  pregabalin (LYRICA) 50 MG capsule Take 50 mg by mouth daily. After each dialysis session    [provider]  traMADol (ULTRAM) 50 MG tablet One to Two Tabs By Mouth Every 8 Hours As Needed For Pain Patient not taking: Reported on 01/20/2018 12/25/17   Fritzi Mandes, MD      VITAL SIGNS:  Blood pressure (!) 201/95, pulse 90, temperature 98.2 F (36.8 C), temperature source Oral, resp.  rate 11, height 5\' 8"  (1.727 m), weight 64 kg, SpO2 99 %.  PHYSICAL EXAMINATION:  Physical Exam  GENERAL:  67 y.o.-year-old patient lying in the bed with no acute distress.  EYES: Pupils equal, round, reactive to light and accommodation. No scleral icterus. Extraocular muscles intact.  HEENT: Head atraumatic, normocephalic. Oropharynx and nasopharynx clear.  NECK:  Supple, no jugular venous distention. No thyroid enlargement, no tenderness.  LUNGS: Normal breath sounds bilaterally, no wheezing, rales, rhonchi or crepitation. No use of accessory muscles of respiration.  CARDIOVASCULAR: S1, S2 normal. No murmurs, rubs, or gallops.  ABDOMEN: Soft, nontender, nondistended. Bowel sounds present. No organomegaly or mass.  EXTREMITIES: +non-pitting edema in the feet bilaterally; no cyanosis, or clubbing. +LUE fistula NEUROLOGIC: Cranial nerves II through XII are intact. Muscle strength 4/5 in the RUE, LUE, and LLE. 2/5 muscle strength in the RLE. Sensation intact. Gait not checked.  PSYCHIATRIC: The patient is alert and oriented x 3.  SKIN: No obvious rash. +ulcer of the right 5th toe without erythema or drainage.  LABORATORY PANEL:   CBC Recent Labs  Lab 01/20/18 0731  WBC 11.5*  HGB 11.7*  HCT 36.4*  PLT 205   ------------------------------------------------------------------------------------------------------------------  Chemistries  Recent Labs  Lab 01/20/18 0925  NA 140  K 3.0*  CL 98  CO2 29  GLUCOSE 90  BUN 17  CREATININE 5.66*  CALCIUM 10.3  AST 26  ALT 8  ALKPHOS 55  BILITOT 0.6   ------------------------------------------------------------------------------------------------------------------  Cardiac Enzymes Recent Labs  Lab 01/20/18 0925  TROPONINI 0.17*   ------------------------------------------------------------------------------------------------------------------  RADIOLOGY:  No results found.    IMPRESSION AND PLAN:   Right lower extremity  weakness- patient describes generalized weakness, but right lower extremity is notably weaker on exam. May be related to recent angiography of that leg. - MRI brain to rule out stroke - continue home aspirin and plavix - PT/OT consult  Elevated troponin- EKG without any ischemic changes and patient not having any active chest pain. May be related to ESRD, although troponin is elevated higher than baseline of 0.08. Follows with Dr. Clayborn Bigness as an outpatient. 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 Lucky Cowboy. No signs of infection. - local wound care  Leukocytosis- WBC 11.5, may be related to recent procedure. No fevers or signs of infection. - monitor  Hypokalemia- K 3.0 - will only give K-dur 60mEq x 1 due to ESRD  Hypertension- BP markedly elevated in the ED. - continue home norvasc, hydralazine, irbesartan -  labetalol IV prn  Hyperlipidemia- stable - continue home lipitor  Chronic diastolic heart failure- stable, no signs of volume overload - continue home lasix 40mg  daily on non-dialysis days  ESRD- on HD MWF, has not missed any recent sessions - consult nephro if still hospitalized on Monday  Peripheral arterial disease- stable s/p RLE angiography 9/12 - continue aspirin and plavix  History of gout- stable, no signs of acute flare - continue allopurinol daily  All the records are reviewed and case discussed with ED provider. Management plans discussed with the patient, family and they are in agreement.  CODE STATUS: Full  TOTAL TIME TAKING CARE OF THIS PATIENT: 40 minutes.    Berna Spare Mayo M.D on 01/20/2018 at 10:57 AM  Between 7am to 6pm - Pager - [phone]  After 6pm go to [email or web address] - Proofreader  [facility] [place] [facility]  Office   [phone]  CC: Primary care physician; [name], MD   Note: This dictation was prepared with Dragon dictation along with smaller phrase technology. Any transcriptional errors that result from this process are unintentional.

## 2018-01-20 NOTE — ED Notes (Signed)
Pedal pulses present bi-lateral. Pt is NAD and VSS.

## 2018-01-20 NOTE — Progress Notes (Signed)
PT Cancellation Note  Patient Details Name: Johnny [name]. MRN: [id number] DOB: 1950/07/28   Cancelled Treatment:    Reason Eval/Treat Not Completed: Patient declined, no reason specified.  Order received.  Chart reviewed.  Pt declined, stating that he feels very weak and shaky and that he needs something to eat.  NA notified and PT will re-attempt when pt is more appropriate.     Roxanne Gates, PT, DPT 01/20/2018, 2:58 PM

## 2018-01-20 NOTE — ED Notes (Signed)
Pt resting at this time. Pt asked if his friend had arrived yet. RN informed him that no one had checked in for him but we would let her back when she got here. Pt denies any needs at this time. RN will continue to monitor pt

## 2018-01-20 NOTE — ED Provider Notes (Signed)
[facility] Emergency Department Provider Note  Time seen: 7:32 AM  I have reviewed the triage vital signs and the nursing notes.   HISTORY  Chief Complaint Weakness    HPI Johnny [name]. is a 67 y.o. male with a past medical history of hypertension, hyperlipidemia, CHF, end-stage renal disorder on hemodialysis Monday/Wednesday/Friday presents to the emergency department for confusion and weakness.  Per EMS report patient's caregiver at home states the patient has had generalized weakness today with mild confusion.  Patient is awake alert he is oriented x4 currently.  States he has been feeling weak all over denies any recent cough, congestion, nausea vomiting or diarrhea.  Denies dysuria.  Patient is on hemodialysis Monday/Wednesday/Friday, did go to dialysis on Friday per patient.  Also had a recent cardiac catheterization on Thursday.  Patient denies any specific complaints besides generalized weakness.   Past Medical History:  Diagnosis Date  . Anemia   . Anxiety   . CHF (congestive heart failure) ([place])   . Chronic kidney disease   . Gout   . Hyperlipidemia   . Hypertension   . Peripheral vascular disease [facility])     Patient Active Problem List   Diagnosis Date Noted  . Fever   . Periumbilical abdominal pain   . Confusion 12/22/2017  . Acute delirium 12/21/2017  . Protein-calorie malnutrition, severe 12/19/2017  . Intractable nausea and vomiting 12/18/2017  . Lymphedema 12/13/2017  . Cellulitis 11/27/2017  . Chest pain 11/19/2017  . Atherosclerosis of native arteries of the extremities with ulceration ([place]) 11/07/2017  . Elevated troponin 10/02/2015  . Complications, dialysis, catheter, mechanical ([place]) 10/02/2015  . Musculoskeletal chest pain 09/28/2015  . ESRD on dialysis ([place]) 09/28/2015  . HTN (hypertension) 09/28/2015  . Chronic diastolic CHF (congestive heart failure) ([place]) 09/28/2015  . Gout 09/28/2015    Past Surgical History:   Procedure Laterality Date  . A/V SHUNTOGRAM Left 06/21/2017   Procedure: A/V SHUNTOGRAM;  Surgeon: Katha Cabal, MD;  Location: [place] [facility];  Service: Cardiovascular;  Laterality: Left;  . AV FISTULA PLACEMENT Left 09/18/2015   Procedure: INSERTION OF ARTERIOVENOUS (AV) GORE-TEX GRAFT ARM ( BRACH/AXILLARY GRAFT W/ INSTANT STICK GRAFT );  Surgeon: Katha Cabal, MD;  Location: [facility] ORS;  Service: Vascular;  Laterality: Left;  . DIALYSIS FISTULA CREATION    . ESOPHAGOGASTRODUODENOSCOPY N/A 12/19/2017   Procedure: ESOPHAGOGASTRODUODENOSCOPY (EGD);  Surgeon: Lin Landsman, MD;  Location: [facility] ENDOSCOPY;  Service: Gastroenterology;  Laterality: N/A;  . LOWER EXTREMITY ANGIOGRAPHY Left 11/16/2017   Procedure: LOWER EXTREMITY ANGIOGRAPHY;  Surgeon: Algernon Huxley, MD;  Location: [place] [facility];  Service: Cardiovascular;  Laterality: Left;  . LOWER EXTREMITY ANGIOGRAPHY Right 01/18/2018   Procedure: LOWER EXTREMITY ANGIOGRAPHY;  Surgeon: Algernon Huxley, MD;  Location: [place] [facility];  Service: Cardiovascular;  Laterality: Right;  . PERIPHERAL VASCULAR CATHETERIZATION Left 09/01/2015   Procedure: A/V Shuntogram/Fistulagram;  Surgeon: Katha Cabal, MD;  Location: [place] [facility];  Service: Cardiovascular;  Laterality: Left;  . PERIPHERAL VASCULAR CATHETERIZATION N/A 09/30/2015   Procedure: A/V Shuntogram/Fistulagram with perm cathether removal;  Surgeon: Algernon Huxley, MD;  Location: [place] [facility];  Service: Cardiovascular;  Laterality: N/A;  . PERIPHERAL VASCULAR CATHETERIZATION Left 09/30/2015   Procedure: A/V Shunt Intervention;  Surgeon: Algernon Huxley, MD;  Location: [place] [facility];  Service: Cardiovascular;  Laterality: Left;  . PERIPHERAL VASCULAR CATHETERIZATION Left 12/03/2015   Procedure: Thrombectomy;  Surgeon: Erskine Squibb  Lucky Cowboy, MD;  Location: [place] [facility];  Service: Cardiovascular;  Laterality: Left;  . PERIPHERAL VASCULAR CATHETERIZATION Left  01/28/2016   Procedure: Thrombectomy;  Surgeon: Algernon Huxley, MD;  Location: [place] [facility];  Service: Cardiovascular;  Laterality: Left;  . PERIPHERAL VASCULAR CATHETERIZATION N/A 01/28/2016   Procedure: A/V Shuntogram/Fistulagram;  Surgeon: Algernon Huxley, MD;  Location: [place] [facility];  Service: Cardiovascular;  Laterality: N/A;    Prior to Admission medications   Medication Sig Start Date End Date Taking? Authorizing Provider  allopurinol (ZYLOPRIM) 100 MG tablet Take 100 mg by mouth daily.    [provider]  amLODipine (NORVASC) 10 MG tablet Take 10 mg by mouth daily.     [provider]  aspirin EC 81 MG tablet Take 1 tablet (81 mg total) by mouth daily. 01/18/18   Algernon Huxley, MD  atorvastatin (LIPITOR) 10 MG tablet Take 1 tablet by mouth daily. 11/23/17   [provider]  Calcium Gluconate 50 MG TABS Take 50 mg by mouth 3 (three) times daily.  05/19/11   [provider]  clonazePAM (KLONOPIN) 0.5 MG tablet Take 0.25 mg by mouth at bedtime.     [provider]  clopidogrel (PLAVIX) 75 MG tablet Take 1 tablet (75 mg total) by mouth daily. 12/03/15   Algernon Huxley, MD  colchicine 0.6 MG tablet Take 0.6 mg by mouth daily as needed (for gout flares).     [provider]  collagenase (SANTYL) ointment Apply topically daily. Patient not taking: Reported on 01/18/2018 11/30/17   Bettey Costa, MD  famotidine (PEPCID) 20 MG tablet Take 1 tablet (20 mg total) by mouth daily. Patient not taking: Reported on 01/18/2018 12/25/17   Fritzi Mandes, MD  folic acid-vitamin b complex-vitamin c-selenium-zinc (DIALYVITE) 3 MG TABS tablet Take by mouth.    [provider]  furosemide (LASIX) 40 MG tablet Take 40 mg by mouth daily.     [provider]  gabapentin (NEURONTIN) 100 MG capsule Take 200 mg by mouth 2 (two) times daily.    [provider]  hydrALAZINE (APRESOLINE) 25 MG tablet Take 25 mg by mouth daily.     [provider]  irbesartan (AVAPRO) 300 MG tablet Take 300 mg by mouth daily.     [provider]  lidocaine-prilocaine (EMLA) cream Apply 1 application topically as needed (port access).    [provider]  multivitamin (RENA-VIT) TABS tablet Take 1 tablet by mouth at bedtime. 12/25/17   Fritzi Mandes, MD  multivitamin-lutein [facility]) CAPS capsule Take 1 capsule by mouth daily. 12/25/17   Fritzi Mandes, MD  Nutritional Supplements (FEEDING SUPPLEMENT, NEPRO CARB STEADY,) LIQD Take 237 mLs by mouth 2 (two) times daily between meals. 12/25/17   Fritzi Mandes, MD  ondansetron (ZOFRAN ODT) 4 MG disintegrating tablet Take 1 tablet (4 mg total) by mouth every 8 (eight) hours as needed for nausea or vomiting. 12/18/17   Earleen Newport, MD  pregabalin (LYRICA) 50 MG capsule Take 50 mg by mouth daily. After each dialysis session    [provider]  traMADol Veatrice Bourbon) 50 MG tablet One to Two Tabs By Mouth Every 8 Hours As Needed For Pain 12/25/17   Fritzi Mandes, MD    Allergies  Allergen Reactions  . Shellfish Allergy Anaphylaxis    Family History  Problem Relation Age of Onset  . Hypertension Unknown   . Heart disease Unknown     Social History Social  History   Tobacco Use  . Smoking status: Former Research scientist (life sciences)  . Smokeless tobacco: Never Used  Substance Use Topics  . Alcohol use: No  . Drug use: No    Review of Systems Constitutional: Negative for fever.  Positive for generalized weakness. ENT: Negative for recent illness/congestion Cardiovascular: Negative for chest pain. Respiratory: Negative for shortness of breath.  Negative for cough. Gastrointestinal: Negative for abdominal pain, vomiting and diarrhea. Genitourinary: Negative for urinary compaints Musculoskeletal: Negative for musculoskeletal complaints Skin: Negative for skin complaints  Neurological: Negative for headache All other ROS  negative  ____________________________________________   PHYSICAL EXAM:  VITAL SIGNS: ED Triage Vitals [01/20/18 0729]  Enc Vitals Group     BP (!) 180/81     Pulse Rate (!) 58     Resp      Temp 98.2 F (36.8 C)     Temp Source Oral     SpO2 99 %     Weight 141 lb (64 kg)     Height 5\' 8"  (1.727 m)     Head Circumference      Peak Flow      Pain Score 0     Pain Loc      Pain Edu?      Excl. in [place]?    Constitutional: Alert and oriented. Well appearing and in no distress. Eyes: Normal exam ENT   Head: Normocephalic and atraumatic.   Mouth/Throat: Mucous membranes are moist. Cardiovascular: Normal rate, regular rhythm.  Respiratory: Normal respiratory effort without tachypnea nor retractions. Breath sounds are clear  Gastrointestinal: Soft and nontender. No distention.  Musculoskeletal: Nontender with normal range of motion in all extremities. Neurologic:  Normal speech and language. No gross focal neurologic deficits  Skin:  Skin is warm, dry and intact.  Psychiatric: Mood and affect are normal.   ____________________________________________    EKG  EKG reviewed and interpreted by myself shows normal sinus rhythm at 94 bpm with a borderline widened QRS, largely normal axis, QTC 503 patient does appear to have lateral ST changes and T wave inversions which appear to be new from prior EKG.  There is some electrical interference, nurse states she attempted multiple times to get this EKG which is the best of the EKG she attempted. ____________________________________________    RADIOLOGY  X-ray negative.  ____________________________________________   INITIAL IMPRESSION / ASSESSMENT AND PLAN / ED COURSE  Pertinent labs & imaging results that were available during my care of the patient were reviewed by me and considered in my medical decision making (see chart for details).  Patient presents to the emergency department for generalized fatigue and weakness  with possible mild confusion.  Awaiting caregiver arrival for further history.  Patient although is alert and oriented continues to state he will have to ask my caregiver why I am here.  He does admit to generalized fatigue and weakness but denies nearly any other complaint.  We will check labs including cardiac enzymes given recent catheterization.  We will continue to closely monitor in the emergency department.  Patient agreeable to plan of care.   Patient did have a 4 beat run of ventricular tachycardia in the emergency department.  Having a very difficult time obtaining lab work, was able to obtain labs but the CMP and troponin hemolyzed.  I looked with an ultrasound as the patient has a left upper extremity fistula I was able to place a right antecubital 20-gauge IV however with flushes well we were not able to  obtain lab work.  Lab is coming for blood collected.  Patient's labs are resulted showing a troponin 0.17 which is elevated compared to his baseline of 0.04-0.08.  Given the patient's weakness today with run of ventricular tachycardia we will admit to the hospitalist service.  ____________________________________________   FINAL CLINICAL IMPRESSION(S) / ED DIAGNOSES  Generalized weakness    Harvest Dark, MD 01/21/18 [phone]

## 2018-01-20 NOTE — Care Management Obs Status (Signed)
[place] NOTIFICATION   Patient Details  Name: Johnny [name]. MRN: [id number] Date of Birth: 1950/08/02   Medicare Observation Status Notification Given:  Yes    Tiffay Pinette A Ercell Razon, RN 01/20/2018, 1:10 PM

## 2018-01-21 LAB — CBC
HCT: 33.9 % — ABNORMAL LOW (ref 40.0–52.0)
HEMOGLOBIN: 11.3 g/dL — AB (ref 13.0–18.0)
MCH: 31.8 pg (ref 26.0–34.0)
MCHC: 33.2 g/dL (ref 32.0–36.0)
MCV: 95.8 fL (ref 80.0–100.0)
PLATELETS: 210 10*3/uL (ref 150–440)
RBC: 3.54 MIL/uL — ABNORMAL LOW (ref 4.40–5.90)
RDW: 15.7 % — AB (ref 11.5–14.5)
WBC: 9.1 10*3/uL (ref 3.8–10.6)

## 2018-01-21 LAB — BASIC METABOLIC PANEL
ANION GAP: 11 (ref 5–15)
BUN: 23 mg/dL (ref 8–23)
CALCIUM: 10 mg/dL (ref 8.9–10.3)
CO2: 30 mmol/L (ref 22–32)
CREATININE: 7.29 mg/dL — AB (ref 0.61–1.24)
Chloride: 102 mmol/L (ref 98–111)
GFR calc Af Amer: 8 mL/min — ABNORMAL LOW (ref >60)
GFR, EST NON AFRICAN AMERICAN: 7 mL/min — AB (ref >60)
Glucose, Bld: 89 mg/dL (ref 70–99)
Potassium: 3.2 mmol/L — ABNORMAL LOW (ref 3.5–5.1)
Sodium: 143 mmol/L (ref 135–145)

## 2018-01-21 LAB — GLUCOSE, CAPILLARY: GLUCOSE-CAPILLARY: 86 mg/dL (ref 70–99)

## 2018-01-21 MED ORDER — MORPHINE SULFATE (PF) 2 MG/ML IV SOLN
2.0000 mg | Freq: Four times a day (QID) | INTRAVENOUS | Status: DC | PRN
  Administered 2018-01-21 – 2018-01-22 (×4): 2 mg via INTRAVENOUS
  Filled 2018-01-21 (×5): qty 1

## 2018-01-21 NOTE — Progress Notes (Signed)
[facility] NAME: Johnny [name]    MR#:  [id number]  DATE OF BIRTH:  1950/10/16  SUBJECTIVE: Patient admitted for right leg weakness, possible stroke.  He feels better today MRI of the brain did not show stroke.  Right leg does hurt but because of recent angiogram and right leg ulcer more than stroke.  CHIEF COMPLAINT:   Chief Complaint  Patient presents with  . Weakness   Right leg pain, nonhealing ulcer.  Had a 5 beat run of V. tach in the emergency room but none after that admission. REVIEW OF SYSTEMS:   ROS CONSTITUTIONAL: No fever, f has fatigue, weakness, poor p.o. intake patient appears chronically ill.  YES: No blurred or double vision.  EARS, NOSE, AND THROAT: No tinnitus or ear pain.  RESPIRATORY: No cough, shortness of breath, wheezing or hemoptysis.  CARDIOVASCULAR: No chest pain, orthopnea, edema.  GASTROINTESTINAL: No nausea, vomiting, diarrhea or abdominal pain.  GENITOURINARY: No dysuria, hematuria.  ENDOCRINE: No polyuria, nocturia,  HEMATOLOGY: No anemia, easy bruising or bleeding SKIN: No rash or lesion. MUSCULOSKELETAL: Patient has bilateral leg dressing present nEUROLOGIC: No tingling, numbness, weakness.  PSYCHIATRY: No anxiety or depression.   DRUG ALLERGIES:   Allergies  Allergen Reactions  . Shellfish Allergy Anaphylaxis    VITALS:  Blood pressure (!) 172/98, pulse 98, temperature 97.7 F (36.5 C), temperature source Oral, resp. rate 20, height 5\' 8"  (1.727 m), weight 62.9 kg, SpO2 100 %.  PHYSICAL EXAMINATION:  GENERAL:  67 y.o.-year-old patient lying in the bed with no acute distress.,  Appears chronically ill. EYES: Pupils equal, round, reactive to light and accommodation. No scleral icterus. Extraocular muscles intact.  HEENT: Head atraumatic, normocephalic. Oropharynx and nasopharynx clear.  NECK:  Supple, no jugular venous distention. No thyroid enlargement, no tenderness.  LUNGS:  Normal breath sounds bilaterally, no wheezing, rales,rhonchi or crepitation. No use of accessory muscles of respiration.  CARDIOVASCULAR: S1, S2 normal. No murmurs, rubs, or gallops.  ABDOMEN: Soft, nontender, nondistended. Bowel sounds present. No organomegaly or mass.  EXTREMITIES: Patient has bilateral pedal edema but does have a right leg dressing, left leg wrapped. NEUROLOGIC: Cranial nerves II through XII are intact. Muscle strength 5/5 in all extremities. Sensation intact. Gait not checked.  PSYCHIATRIC: The patient is alert and oriented x 3.  SKIN: No obvious rash,+ulcer of the right 5th toe without erythema or drainage   LABORATORY PANEL:   CBC Recent Labs  Lab 01/21/18 0556  WBC 9.1  HGB 11.3*  HCT 33.9*  PLT 210   ------------------------------------------------------------------------------------------------------------------  Chemistries  Recent Labs  Lab 01/20/18 0925 01/21/18 0556  NA 140 143  K 3.0* 3.2*  CL 98 102  CO2 29 30  GLUCOSE 90 89  BUN 17 23  CREATININE 5.66* 7.29*  CALCIUM 10.3 10.0  AST 26  --   ALT 8  --   ALKPHOS 55  --   BILITOT 0.6  --    ------------------------------------------------------------------------------------------------------------------  Cardiac Enzymes Recent Labs  Lab 01/20/18 2128  TROPONINI 0.17*   ------------------------------------------------------------------------------------------------------------------  RADIOLOGY:  Mr Brain Wo Contrast  Result Date: 01/20/2018 CLINICAL DATA:  History of hypertension and heart failure. Generalized weakness beginning today. EXAM: MRI HEAD WITHOUT CONTRAST TECHNIQUE: Multiplanar, multiecho pulse sequences of the brain and surrounding structures were obtained without intravenous contrast. COMPARISON:  Head CT 07/09/2009 FINDINGS: Brain: Diffusion imaging does not show any acute or subacute infarction. The brainstem and cerebellum are normal. Cerebral hemispheres  show chronic  small-vessel ischemic changes of the hemispheric white matter and the basal ganglia and thalami right more than left. 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 No cortical or large vessel territory infarction. No mass lesion, hemorrhage, hydrocephalus or extra-axial collection. Vascular: Major vessels at the base of the brain show flow. Skull and upper cervical spine: Negative Sinuses/Orbits: Clear/normal Other: None IMPRESSION: No acute or subacute finding. Chronic small-vessel ischemic changes of the cerebral hemispheric white matter, basal ganglia and thalami. 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 No evidence of acute hemorrhage. Electronically Signed   By: Nelson Chimes M.D.   On: 01/20/2018 14:42   Dg Chest Portable 1 View  Result Date: 01/20/2018 CLINICAL DATA:  Weakness EXAM: PORTABLE CHEST 1 VIEW COMPARISON:  12/23/2017 FINDINGS: Lungs are essentially clear. Mild bibasilar scarring. No focal consolidation. No pleural effusion or pneumothorax. Heart is top-normal in size. IMPRESSION: No evidence of acute cardiopulmonary disease. Electronically Signed   By: Julian Hy M.D.   On: 01/20/2018 11:11    EKG:   Orders placed or performed during the hospital encounter of 01/20/18  . ED EKG  . ED EKG  . EKG 12-Lead  . EKG 12-Lead  . EKG 12-Lead  . EKG 12-Lead    ASSESSMENT AND PLAN:  Right lower extremity weakness- patient describes generalized weakness, but right lower extremity is notably weaker on exam. May be related to recent angiography of that leg. MRI of the brain did not show acute stroke.  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  Follow.  Ultrasound, echocardiogram.  Continue aspirin, Plavix, physical therapy, occupational therapy consult.  2.  Nonsustained V. tach in the emergency room the context of hypokalemia: Potassium being replaced.: D than yesterday but still low.  Id not have further V.  tach, monitor on telemetry Slightly elevated troponins likely demand ischemia in the context of ESRD.  Check echocardiogram. #3 hyperlipidemia: Continue statins 4.  Peripheral vascular disease status post nonhealing onset of the right leg, vascular surgery did angiogram of the right leg on September 12.  Continue dressing changes for the right leg and also dressing changes with the Una wraps of the left leg.  Consult wound care. 5.  ESRD: Nephrology consult for dialysis. Overall he has poor p.o. intake, now with peripheral vascular disease, leg pain may need to go to rehab. All the records are reviewed and case discussed with Care Management/Social Workerr. Management plans discussed with the patient, family and they are in agreement.  CODE STATUS: full  TOTAL TIME TAKING CARE OF THIS PATIENT: 40 minutes.   POSSIBLE D/C IN 1-2 DAYS, DEPENDING ON CLINICAL CONDITION. More than 50% time spent in reviewing the old charts, talking to the patient in counseling coordination of the care.  Epifanio Lesches M.D on 01/21/2018 at 12:13 PM  Between 7am to 6pm - Pager - [phone]  After 6pm go to [email or web address] - password [email or web address] [facility]  Office  [phone]  CC: Primary care physician; [name], MD   Note: This dictation was prepared with Dragon dictation along with smaller phrase technology. Any transcriptional errors that result from this process are unintentional.

## 2018-01-21 NOTE — Progress Notes (Signed)
PT Cancellation Note  Patient Details Name: Johnny [name]. MRN: [id number] DOB: [birth year]   Cancelled Treatment:    Reason Eval/Treat Not Completed: Fatigue/lethargy limiting ability to participate;Pain limiting ability to participate;Medical issues which prohibited therapy.  Order received.  Chart reviewed.  Pt very lethargic upon PT arrival, responding only intermittently.  PT attempted to assist pt to bedside and pt grimaced and stated that his legs were hurting.  Pt's telemetry monitor alerted tachycardia and heart arrhythmia and RN was notified.  Pt assisted pt back to bed and positioned for comfort.  Per RN request, will re-attempt after lunch if pt is appropriate.   Roxanne Gates, PT, DPT 01/21/2018, 10:02 AM

## 2018-01-21 NOTE — Progress Notes (Signed)
PT Cancellation Note  Patient Details Name: Johnny [name]. MRN: [id number] DOB: 30-Oct-1950   Cancelled Treatment:    Reason Eval/Treat Not Completed: Patient's level of consciousness.  Consulted RN and pt has become increasingly lethargic today and is not appropriate for PT.  Will re-attempt tomorrow if pt is appropriate.   Roxanne Gates, PT, DPT 01/21/2018, 3:21 PM

## 2018-01-22 ENCOUNTER — Observation Stay (HOSPITAL_BASED_OUTPATIENT_CLINIC_OR_DEPARTMENT_OTHER)
Admit: 2018-01-22 | Discharge: 2018-01-22 | Disposition: A | Discharge status: Home / Self Care | Payer: Medicare Other | Attending: Internal Medicine | Admitting: Internal Medicine

## 2018-01-22 DIAGNOSIS — R079 Chest pain, unspecified: Secondary | ICD-10-CM | POA: Diagnosis not present

## 2018-01-22 DIAGNOSIS — I70238 Atherosclerosis of native arteries of right leg with ulceration of other part of lower right leg: Secondary | ICD-10-CM | POA: Diagnosis not present

## 2018-01-22 NOTE — Progress Notes (Signed)
HD tx start    01/22/18 1540  Vital Signs  Pulse Rate 96  Pulse Rate Source Monitor  Resp 15  BP 128/74  BP Location Right Arm  BP Method Automatic  Patient Position (if appropriate) Lying  Oxygen Therapy  SpO2 100 %  O2 Device Room Air  During Hemodialysis Assessment  Blood Flow Rate (mL/min) 350 mL/min  Arterial Pressure (mmHg) -250 mmHg  Venous Pressure (mmHg) 200 mmHg  Transmembrane Pressure (mmHg) 70 mmHg  Ultrafiltration Rate (mL/min) 570 mL/min  Dialysate Flow Rate (mL/min) 600 ml/min  Conductivity: Machine  14.1  HD Safety Checks Performed Yes  Dialysis Fluid Bolus Normal Saline  Bolus Amount (mL) 250 mL  Intra-Hemodialysis Comments Tx initiated  Fistula / Graft Left Upper arm  No Placement Date or Time found.   Orientation: Left  Access Location: Upper arm  Status Accessed  Needle Size 15

## 2018-01-22 NOTE — Progress Notes (Signed)
Pre HD assessment    01/22/18 1526  Neurological  Level of Consciousness Alert  Orientation Level Oriented to person;Oriented to place;Oriented to situation;Disoriented to time  Respiratory  Respiratory Pattern Regular;Unlabored  Chest Assessment Chest expansion symmetrical  Cardiac  Pulse Irregular  ECG Monitor Yes  Cardiac Rhythm ST  Vascular  R Radial Pulse +2  L Radial Pulse +2  Edema Generalized;Right lower extremity;Left lower extremity  Integumentary  Integumentary (WDL) X  Skin Color Appropriate for ethnicity  Skin Condition Dry;Flaky  Musculoskeletal  Musculoskeletal (WDL) X  Generalized Weakness Yes  Assistive Device None  GU Assessment  Genitourinary (WDL) X  Genitourinary Symptoms  (HD)  Psychosocial  Psychosocial (WDL) WDL

## 2018-01-22 NOTE — Evaluation (Signed)
Physical Therapy Evaluation Patient Details Name: Johnny [name]. MRN: [id number] DOB: [birth year] Today's Date: 01/22/2018   History of Present Illness  Johnny [name]  is a 67 y.o. male who presents with a known history of HTN, chronic diastolic heart failure, ESRD on HD, HLD, and PAD, presenting with generalized weakness. Pt admitted with generalized weakness and confusion upon arrival. Patient just underwent right lower extremity angiography on 01/18/18. 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   Clinical Impression  Prior to hospitalization pt utilized a SPC at baseline for assistance with basic ambulation in home. Pt also reporting increased use of RW more recently for assistance. Pt currently unable to perform sit to stand transfer at this time due to weakness and R>L LE pain. Strength appears limited compared to baseline based on slow transfers and inability to attempt sit to stand. BP obtained in supine measured 177/91 before treatment. Following treatment BP measured 170/94. HR at 103 and O2sat following supine to sit 95%. Pt with reports of significant fatigue and weakness during bed mobility. Pt confused this date indicating he has walked to the bathroom however it appears based on conversation with nurse that pt has not been up since admission. Pt also having difficulty relaying accurate information about pain levels and AD use at home. Pt would benefit from skilled PT to address limitations in mobility listed above and will likely require continued treatment in short term rehabilitation to return to baseline function.     Follow Up Recommendations SNF    Equipment Recommendations  Rolling walker with 5" wheels    Recommendations for Other Services       Precautions / Restrictions Precautions Precautions: None Restrictions Weight Bearing Restrictions: No      Mobility  Bed Mobility Overal bed mobility: Needs Assistance Bed Mobility: Supine to Sit;Sit to Supine      Supine to sit: Supervision Sit to supine: Supervision   General bed mobility comments: Increased time required for bed mobility, significant cuing required for hand placement and positioning, 9/10 pain levels during bed mobility in RLE.   Transfers                 General transfer comment: Unable to perform sit to stand transfer from bed due to pain.   Ambulation/Gait             General Gait Details: Unable at this time due to pain.   Stairs            Wheelchair Mobility    Modified Rankin (Stroke Patients Only)       Balance Overall balance assessment: Needs assistance Sitting-balance support: Bilateral upper extremity supported;Feet supported Sitting balance-Leahy Scale: Poor Sitting balance - Comments: Pt frequently leaning and having 2 LOB in sitting and requiring min assist to maintain balance.                                      Pertinent Vitals/Pain Pain Assessment: 0-10 Pain Score: 9  Pain Location: R foot and lower leg Pain Descriptors / Indicators: Sharp Pain Intervention(s): Patient requesting pain meds-RN notified;Limited activity within patient's tolerance;Monitored during session    [place] expects to be discharged to:: Private residence Living Arrangements: Alone Available Help at Discharge: Friend(s)           Home Equipment: Kasandra Knudsen - single point;Walker - 2 wheels  Prior Function Level of Independence: Needs assistance         Comments: Pt reports using both a SPC and RW in home. Unclear how much assistance is required from his neighbor due to unable to accurately communicate information.     Hand Dominance        Extremity/Trunk Assessment   Upper Extremity Assessment Upper Extremity Assessment: Generalized weakness;Difficult to assess due to impaired cognition    Lower Extremity Assessment Lower Extremity Assessment: Generalized weakness;Difficult to assess due to impaired  cognition(R knee remains in ~15 deg of flexion and pt appears unable to fully extend)       Communication   Communication: No difficulties  Cognition Arousal/Alertness: Lethargic   Overall Cognitive Status: Impaired/Different from baseline Area of Impairment: Orientation;Following commands                 Orientation Level: Disoriented to;Time             General Comments: Pt requiring increased time to respond to basic questions. Inconsistent about pain levels and current independence level.       General Comments      Exercises     Assessment/Plan    PT Assessment Patient needs continued PT services  PT Problem List Decreased strength;Decreased safety awareness;Decreased mobility;Decreased range of motion;Decreased activity tolerance;Decreased cognition;Decreased skin integrity;Pain;Decreased balance;Cardiopulmonary status limiting activity       PT Treatment Interventions DME instruction;Therapeutic activities;Cognitive remediation;Patient/family education;Therapeutic exercise;Gait training;Stair training;Balance training;Neuromuscular re-education;Functional mobility training    PT Goals (Current goals can be found in the Care Plan section)  Acute Rehab PT Goals Patient Stated Goal: Be pain free and return to independence level PT Goal Formulation: With patient Time For Goal Achievement: 02/05/18 Potential to Achieve Goals: Fair    Frequency Min 2X/week   Barriers to discharge Decreased caregiver support Pt lives alone with only intermittent support available from a neighbor.    Co-evaluation               AM-PAC PT "6 Clicks" Daily Activity  Outcome Measure Difficulty turning over in bed (including adjusting bedclothes, sheets and blankets)?: Unable Difficulty moving from lying on back to sitting on the side of the bed? : Unable Difficulty sitting down on and standing up from a chair with arms (e.g., wheelchair, bedside commode, etc,.)?:  Unable Help needed moving to and from a bed to chair (including a wheelchair)?: Total Help needed walking in hospital room?: Total Help needed climbing 3-5 steps with a railing? : Total 6 Click Score: 6    End of Session Equipment Utilized During Treatment: Gait belt Activity Tolerance: Patient limited by fatigue;Patient limited by pain;Patient limited by lethargy Patient left: with bed alarm set;with call bell/phone within reach Nurse Communication: Mobility status;Patient requests pain meds PT Visit Diagnosis: Muscle weakness (generalized) (M62.81);Difficulty in walking, not elsewhere classified (R26.2);Other abnormalities of gait and mobility (R26.89);Unsteadiness on feet (R26.81)    Time: 6754-4920 PT Time Calculation (min) (ACUTE ONLY): 35 min   Charges:              Ernie Avena, SPT 01/22/2018, 11:10 AM

## 2018-01-22 NOTE — Progress Notes (Signed)
Post HD assessment    01/22/18 1838  Neurological  Level of Consciousness Alert  Orientation Level Oriented to person;Oriented to place;Oriented to situation;Disoriented to time  Respiratory  Respiratory Pattern Regular;Unlabored  Chest Assessment Chest expansion symmetrical  Cardiac  Pulse Irregular  ECG Monitor Yes  Cardiac Rhythm ST  Vascular  R Radial Pulse +2  L Radial Pulse +2  Edema Generalized;Right lower extremity;Left lower extremity  Integumentary  Integumentary (WDL) X  Skin Color Appropriate for ethnicity  Skin Condition Dry;Flaky  Musculoskeletal  Musculoskeletal (WDL) X  Generalized Weakness Yes  Assistive Device None  GU Assessment  Genitourinary (WDL) X  Genitourinary Symptoms  (HD)  Psychosocial  Psychosocial (WDL) WDL

## 2018-01-22 NOTE — Progress Notes (Signed)
Pre HD assessment    01/22/18 1525  Vital Signs  Temp 98.8 F (37.1 C)  Temp Source Oral  Pulse Rate 96  Pulse Rate Source Monitor  Resp 12  BP (!) 142/79  BP Location Right Arm  BP Method Automatic  Patient Position (if appropriate) Lying  Oxygen Therapy  SpO2 100 %  O2 Device Room Air  Pain Assessment  Pain Scale 0-10  Pain Score 0  Dialysis Weight  Weight 64.4 kg  Type of Weight Pre-Dialysis  Time-Out for Hemodialysis  What Procedure? HD  Pt Identifiers(min of two) First/Last Name;MRN/Account#  Correct Site? Yes  Correct Side? Yes  Correct Procedure? Yes  Consents Verified? Yes  Rad Studies Available? N/A  Safety Precautions Reviewed? Yes  Engineer, civil (consulting) Number  (7A)  Station Number 4  UF/Alarm Test Passed  Conductivity: Meter 14  Conductivity: Machine  14.1  pH 7.6  Reverse Osmosis main  Normal Saline Lot Number [id number]  Dialyzer Lot Number [id number]  Disposable Set Lot Number [id number]  Machine Temperature 98.6 F (37 C)  Musician and Audible Yes  Blood Lines Intact and Secured Yes  Pre Treatment Patient Checks  Vascular access used during treatment Graft  Hepatitis B Surface Antigen Results Negative  Date Hepatitis B Surface Antigen Drawn 04/26/17  Hepatitis B Surface Antibody  (>10)  Date Hepatitis B Surface Antibody Drawn 04/26/17  Hemodialysis Consent Verified Yes  Hemodialysis Standing Orders Initiated Yes  ECG (Telemetry) Monitor On Yes  Prime Ordered Normal Saline  Length of  DialysisTreatment -hour(s) 3.5 Hour(s)  Dialyzer Elisio 17H NR  Dialysate 3K, 2.5 Ca  Dialysis Anticoagulant None  Dialysate Flow Ordered 600  Blood Flow Rate Ordered 400 mL/min  Ultrafiltration Goal 1.5 Liters  Pre Treatment Labs  (BMP)  Dialysis Blood Pressure Support Ordered Normal Saline  Education / Care Plan  Dialysis Education Provided Yes  Documented Education in Care Plan Yes  Fistula / Graft Left Upper arm  No Placement Date or Time  found.   Orientation: Left  Access Location: Upper arm  Site Condition No complications  Fistula / Graft Assessment Present;Thrill;Bruit  Drainage Description None

## 2018-01-22 NOTE — Progress Notes (Signed)
HD tx end    01/22/18 1829  Vital Signs  Pulse Rate (!) 102  Pulse Rate Source Monitor  Resp 18  BP (!) 162/94  BP Location Right Arm  BP Method Automatic  Patient Position (if appropriate) Lying  Oxygen Therapy  SpO2 100 %  O2 Device Room Air  During Hemodialysis Assessment  Dialysis Fluid Bolus Normal Saline  Bolus Amount (mL) 250 mL  Intra-Hemodialysis Comments Tx completed

## 2018-01-22 NOTE — Progress Notes (Signed)
[facility]  ROUNDING NOTE   Subjective:   [name] admitted to [facility] on 01/20/2018 for Ventricular tachycardia ([place]) [I47.2] Weakness [R53.1]  Patient's last hemodialysis was Friday.   Patient had angiogram with balloon angioplasty and stent placement on 9/12.   Objective:  Vital signs in last 24 hours:  Temp:  [98.4 F (36.9 C)-98.7 F (37.1 C)] 98.5 F (36.9 C) (09/16 1207) Pulse Rate:  [100-107] 100 (09/16 1207) Resp:  [18-20] 18 (09/16 1207) BP: (134-194)/(75-82) 134/75 (09/16 1207) SpO2:  [98 %-100 %] 100 % (09/16 1207)  Weight change:  Filed Weights   01/20/18 0729 01/20/18 1235  Weight: 64 kg 62.9 kg    Intake/Output: No intake/output data recorded.   Intake/Output this shift:  Total I/O In: 360 [P.O.:360] Out: -   Physical Exam: General: NAD, laying in bed  Head: Normocephalic, atraumatic. Moist oral mucosal membranes  Eyes: Anicteric, PERRL  Neck: Supple, trachea midline  Lungs:  Clear to auscultation  Heart: Regular rate and rhythm  Abdomen:  Soft, nontender,   Extremities: In clean and dry dressings  Neurologic: Nonfocal, moving all four extremities  Skin: No lesions  Access: Left AVG    Basic Metabolic Panel: Recent Labs  Lab 01/20/18 0925 01/21/18 0556  NA 140 143  K 3.0* 3.2*  CL 98 102  CO2 29 30  GLUCOSE 90 89  BUN 17 23  CREATININE 5.66* 7.29*  CALCIUM 10.3 10.0    Liver Function Tests: Recent Labs  Lab 01/20/18 0925  AST 26  ALT 8  ALKPHOS 55  BILITOT 0.6  PROT 7.9  ALBUMIN 3.7   No results for input(s): LIPASE, AMYLASE in the last 168 hours. No results for input(s): AMMONIA in the last 168 hours.  CBC: Recent Labs  Lab 01/20/18 0731 01/21/18 0556  WBC 11.5* 9.1  HGB 11.7* 11.3*  HCT 36.4* 33.9*  MCV 97.0 95.8  PLT 205 210    Cardiac Enzymes: Recent Labs  Lab 01/20/18 0925 01/20/18 1539 01/20/18 2128  TROPONINI 0.17* 0.17* 0.17*    BNP: Invalid input(s):  POCBNP  CBG: Recent Labs  Lab 01/20/18 1502 01/21/18 1609  GLUCAP 68* 27    Microbiology: Results for orders placed or performed during the hospital encounter of 01/20/18  MRSA PCR Screening     Status: None   Collection Time: 01/20/18  1:07 PM  Result Value Ref Range Status   MRSA by PCR NEGATIVE NEGATIVE Final    Comment:        The GeneXpert MRSA Assay (FDA approved for NASAL specimens only), is one component of a comprehensive MRSA colonization surveillance program. It is not intended to diagnose MRSA infection nor to guide or monitor treatment for MRSA infections. Performed at [facility], [address] [place], [place] [address]     Coagulation Studies: No results for input(s): LABPROT, INR in the last 72 hours.  Urinalysis: No results for input(s): COLORURINE, LABSPEC, PHURINE, GLUCOSEU, HGBUR, BILIRUBINUR, KETONESUR, PROTEINUR, UROBILINOGEN, NITRITE, LEUKOCYTESUR in the last 72 hours.  Invalid input(s): APPERANCEUR    Imaging: Mr Brain Wo Contrast  Result Date: 01/20/2018 CLINICAL DATA:  History of hypertension and heart failure. Generalized weakness beginning today. EXAM: MRI HEAD WITHOUT CONTRAST TECHNIQUE: Multiplanar, multiecho pulse sequences of the brain and surrounding structures were obtained without intravenous contrast. COMPARISON:  Head CT 07/09/2009 FINDINGS: Brain: Diffusion imaging does not show any acute or subacute infarction. The brainstem and cerebellum are normal. Cerebral hemispheres show chronic small-vessel  ischemic changes of the hemispheric white matter and the basal ganglia and thalami right more than left. 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 No cortical or large vessel territory infarction. No mass lesion, hemorrhage, hydrocephalus or extra-axial collection. Vascular: Major vessels at the base of the brain show flow. Skull and upper cervical spine: Negative Sinuses/Orbits:  Clear/normal Other: None IMPRESSION: No acute or subacute finding. Chronic small-vessel ischemic changes of the cerebral hemispheric white matter, basal ganglia and thalami. 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 No evidence of acute hemorrhage. Electronically Signed   By: Nelson Chimes M.D.   On: 01/20/2018 14:42     Medications:    . allopurinol  100 mg Oral Daily  . amLODipine  10 mg Oral Daily  . aspirin EC  81 mg Oral Daily  . atorvastatin  10 mg Oral q1800  . calcium carbonate  200 mg of elemental calcium Oral TID  . clopidogrel  75 mg Oral Daily  . famotidine  20 mg Oral Daily  . feeding supplement (NEPRO CARB STEADY)  237 mL Oral BID BM  . furosemide  40 mg Oral Once per day on Sun Tue Thu Sat  . heparin  5,000 Units Subcutaneous Q8H  . hydrALAZINE  25 mg Oral Daily  . irbesartan  300 mg Oral Daily  . multivitamin  1 tablet Oral QHS  . multivitamin-lutein  1 capsule Oral Daily   acetaminophen **OR** acetaminophen, clonazePAM, labetalol, morphine injection, ondansetron **OR** ondansetron (ZOFRAN) IV, polyethylene glycol  Assessment/ Plan:  [name] is a 67 y.o. black male with end stage renal disease on hemodialysis, congestive heart failure, hypertension, peripheral vascular disease  [name]. MWF L AVF  65kg  1. End Stage Renal Disease: hemodialysis for later today. Orders prepared.   2. Hypertension: blood pressure at goal.  - amlodipine, fursoemide, hydralazine, irbesartan.   3. Anemia of chronic kidney disease: hemoglobin 11.3 - no indication for ESA  4. Secondary Hyperparathyroidism: labs from 9/9 PTH 291, calcium 9.8, phosphorus 5.2 - calcium carbonate with meals.   5. Peripheral vascular disease:  - Appreciate vascular input.    LOS: 0 Johnny [name] 9/16/20192:08 PM

## 2018-01-22 NOTE — Evaluation (Signed)
Occupational Therapy Evaluation Patient Details Name: Johnny [name]. MRN: [id number] DOB: 18-Dec-1950 Today's Date: 01/22/2018    History of Present Illness Johnny [name]  is a 67 y.o. male who presents with a known history of HTN, chronic diastolic heart failure, ESRD on HD, HLD, and PAD, presenting with generalized weakness. Pt admitted with generalized weakness and confusion upon arrival. Patient just underwent right lower extremity angiography on 01/18/18. 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    Clinical Impression   Pt seen for OT evaluation this date. Prior to hospital admission, per pt report (intermittent confusion during session so unclear as to accuracy of info provided on PLOF and home layout), pt was ambulating with a RW>SPC, indep with basic ADL and med mgt, and had a neighbor who helped with meals, cleaning, and transportation.  Pt lives in an apartment by himself, with his neighbor who acts as "caretaker" who lives down the hall.  Currently pt demonstrates impairments in cognition (time/process commands, intermittent confusion), generalized weakness, and pain (L worse than R). Pt requires significant assist with basic ADL 2/2 pain. Pt unable to participate in transfers 2/2 pain. Pt would benefit from skilled OT to address noted impairments and functional limitations (see below for any additional details) in order to maximize safety and independence while minimizing falls risk and caregiver burden.  Upon hospital discharge, recommend pt discharge to [place].     Follow Up Recommendations  SNF    Equipment Recommendations  3 in 1 bedside commode;Other (comment)(reacher)    Recommendations for Other Services       Precautions / Restrictions Precautions Precautions: Fall Restrictions Weight Bearing Restrictions: No      Mobility Bed Mobility Overal bed mobility: Needs Assistance Bed Mobility: Supine to Sit;Sit to Supine     Supine to sit: Supervision Sit to  supine: Supervision   General bed mobility comments: + time/effort and significant pain limiting his performance  Transfers                 General transfer comment: unable to attempt 2/2 pain    Balance Overall balance assessment: Needs assistance Sitting-balance support: Bilateral upper extremity supported;Feet supported Sitting balance-Leahy Scale: Poor Sitting balance - Comments: CGA to min assist for sitting balance, 2/2 pain                                   ADL either performed or assessed with clinical judgement   ADL Overall ADL's : Needs assistance/impaired Eating/Feeding: Set up;Bed level Eating/Feeding Details (indicate cue type and reason): set up assist for breakfast as pt was limited by pain Grooming: Bed level;Set up   Upper Body Bathing: Moderate assistance;Minimal assistance;Bed level   Lower Body Bathing: Bed level;Maximal assistance   Upper Body Dressing : Minimal assistance;Moderate assistance;Bed level   Lower Body Dressing: Bed level;Maximal assistance     Toilet Transfer Details (indicate cue type and reason): unable to attempt 2/2 pain           General ADL Comments: pt very pain limited requiring additional assist for ADL     Vision Baseline Vision/History: Wears glasses Wears Glasses: At all times Patient Visual Report: No change from baseline Vision Assessment?: No apparent visual deficits     Perception     Praxis      Pertinent Vitals/Pain Pain Assessment: 0-10 Pain Score: 10-Worst pain ever Pain Location: R foot and  lower leg Pain Descriptors / Indicators: Sharp;Grimacing;Guarding Pain Intervention(s): Limited activity within patient's tolerance;Monitored during session;Premedicated before session;Repositioned     Hand Dominance Right   Extremity/Trunk Assessment Upper Extremity Assessment Upper Extremity Assessment: Generalized weakness;Difficult to assess due to impaired cognition(LUE AV fistula)    Lower Extremity Assessment Lower Extremity Assessment: Defer to PT evaluation;Generalized weakness;Difficult to assess due to impaired cognition       Communication Communication Communication: No difficulties   Cognition Arousal/Alertness: Awake/alert Behavior During Therapy: Restless Overall Cognitive Status: Impaired/Different from baseline Area of Impairment: Orientation;Following commands                 Orientation Level: Disoriented to;Time;Situation     Following Commands: Follows one step commands with increased time       General Comments: appears somewhat confused intermittently during session, + time to respond to questions   General Comments       Exercises     Shoulder Instructions      Home Living Family/patient expects to be discharged to:: Private residence Living Arrangements: Alone Available Help at Discharge: Friend(s);Available PRN/intermittently Type of Home: Apartment       Home Layout: One level               Home Equipment: Cane - single point;Walker - 2 wheels   Additional Comments:  (unsure of reliability of info provided 2/2 pt's intermittent confusion)      Prior Functioning/Environment Level of Independence: Needs assistance  Gait / Transfers Assistance Needed: Pt reports using both a SPC and RW in home.  ADL's / Homemaking Assistance Needed: pt reports indep with med mgt and ADL, neighbor assists with meals, cleaning, and transportation (unsure of reliability of info provided 2/2 pt's intermittent confusion)   Comments: Pt reports using both a SPC and RW in home. Unclear how much assistance is required from his neighbor due to unable to accurately communicate information.        OT Problem List: Decreased strength;Decreased knowledge of use of DME or AE;Decreased range of motion;Pain;Impaired balance (sitting and/or standing)      OT Treatment/Interventions: Self-care/ADL training;Balance training;Therapeutic  exercise;Therapeutic activities;Modalities;Energy conservation;DME and/or AE instruction;Manual therapy;Patient/family education    OT Goals(Current goals can be found in the care plan section) Acute Rehab OT Goals Patient Stated Goal: have less pain and go home OT Goal Formulation: With patient Time For Goal Achievement: 02/05/18 Potential to Achieve Goals: Good ADL Goals Pt Will Perform Lower Body Bathing: with adaptive equipment;with min assist;sit to/from stand Pt Will Perform Lower Body Dressing: with min assist;sit to/from stand;with adaptive equipment Pt Will Transfer to Toilet: with min assist;with mod assist;bedside commode;stand pivot transfer  OT Frequency: Min 1X/week   Barriers to D/C: Decreased caregiver support          Co-evaluation              AM-PAC PT "6 Clicks" Daily Activity     Outcome Measure Help from another person eating meals?: A Little Help from another person taking care of personal grooming?: A Little Help from another person toileting, which includes using toliet, bedpan, or urinal?: A Lot Help from another person bathing (including washing, rinsing, drying)?: A Lot Help from another person to put on and taking off regular upper body clothing?: A Lot Help from another person to put on and taking off regular lower body clothing?: A Lot 6 Click Score: 14   End of Session Nurse Communication: Other (comment)(pt requested info on where his  personal belongings may be located)  Activity Tolerance: Patient limited by pain Patient left: in bed;with call bell/phone within reach;with bed alarm set  OT Visit Diagnosis: Other abnormalities of gait and mobility (R26.89);History of falling (Z91.81);Muscle weakness (generalized) (M62.81);Pain Pain - Right/Left: Left Pain - part of body: Ankle and joints of foot;Leg                Time: 2482-5003 OT Time Calculation (min): 19 min Charges:  OT General Charges $OT Visit: 1 Visit OT Evaluation $OT Eval  Moderate Complexity: 1 Mod  Jeni Salles, MPH, MS, OTR/L ascom [phone] 01/22/18, 11:58 AM

## 2018-01-22 NOTE — Progress Notes (Signed)
[facility] [place] NAME: Johnny [name]    MR#:  [id number]  DATE OF BIRTH:  01-23-1951  SUBJECTIVE: Leg pain is better today, eating better than yesterday.  Spoke with nephrology, he has been declining recently..  CHIEF COMPLAINT:   Chief Complaint  Patient presents with  . Weakness  Complains of 3 out of 10 pain in the legs.  REVIEW OF SYSTEMS:   ROS CONSTITUTIONAL: No fever,  has fatigue, weakness,  YES: No blurred or double vision.  EARS, NOSE, AND THROAT: No tinnitus or ear pain.  RESPIRATORY: No cough, shortness of breath, wheezing or hemoptysis.  CARDIOVASCULAR: No chest pain, orthopnea, edema.  GASTROINTESTINAL: No nausea, vomiting, diarrhea or abdominal pain.  GENITOURINARY: No dysuria, hematuria.  ENDOCRINE: No polyuria, nocturia,  HEMATOLOGY: No anemia, easy bruising or bleeding SKIN: No rash or lesion. MUSCULOSKELETAL: Patient has bilateral leg dressing present nEUROLOGIC: No tingling, numnEss, weakness.  PSYCHIATRY: No anxiety or depression.   DRUG ALLERGIES:   Allergies  Allergen Reactions  . Shellfish Allergy Anaphylaxis    VITALS:  Blood pressure 134/75, pulse 100, temperature 98.5 F (36.9 C), temperature source Oral, resp. rate 18, height 5\' 8"  (1.727 m), weight 62.9 kg, SpO2 100 %.  PHYSICAL EXAMINATION:  GENERAL:  67 y.o.-year-old patient lying in the bed with no acute distress.,  Appears chronically ill. EYES: Pupils equal, round, reactive to light and accommodation. No scleral icterus. Extraocular muscles intact.  HEENT: Head atraumatic, normocephalic. Oropharynx and nasopharynx clear.  NECK:  Supple, no jugular venous distention. No thyroid enlargement, no tenderness.  LUNGS: Normal breath sounds bilaterally, no wheezing, rales,rhonchi or crepitation. No use of accessory muscles of respiration.  CARDIOVASCULAR: S1, S2 normal. No murmurs, rubs, or gallops.  ABDOMEN: Soft, nontender, nondistended.  Bowel sounds present. No organomegaly or mass.  EXTREMITIES: Patient has bilateral pedal edema but does have a right leg dressing, left leg wrapped. NEUROLOGIC: Cranial nerves II through XII are intact. Muscle strength 5/5 in all extremities. Sensation intact. Gait not checked.  PSYCHIATRIC: The patient is alert and oriented x 3.  SKIN: No obvious rash,+ulcer of the right 5th toe without erythema or drainage   LABORATORY PANEL:   CBC Recent Labs  Lab 01/21/18 0556  WBC 9.1  HGB 11.3*  HCT 33.9*  PLT 210   ------------------------------------------------------------------------------------------------------------------  Chemistries  Recent Labs  Lab 01/20/18 0925 01/21/18 0556  NA 140 143  K 3.0* 3.2*  CL 98 102  CO2 29 30  GLUCOSE 90 89  BUN 17 23  CREATININE 5.66* 7.29*  CALCIUM 10.3 10.0  AST 26  --   ALT 8  --   ALKPHOS 55  --   BILITOT 0.6  --    ------------------------------------------------------------------------------------------------------------------  Cardiac Enzymes Recent Labs  Lab 01/20/18 2128  TROPONINI 0.17*   ------------------------------------------------------------------------------------------------------------------  RADIOLOGY:  Mr Brain Wo Contrast  Result Date: 01/20/2018 CLINICAL DATA:  History of hypertension and heart failure. Generalized weakness beginning today. EXAM: MRI HEAD WITHOUT CONTRAST TECHNIQUE: Multiplanar, multiecho pulse sequences of the brain and surrounding structures were obtained without intravenous contrast. COMPARISON:  Head CT 07/09/2009 FINDINGS: Brain: Diffusion imaging does not show any acute or subacute infarction. The brainstem and cerebellum are normal. Cerebral hemispheres show chronic small-vessel ischemic changes of the hemispheric white matter and the basal ganglia and thalami right more than left. 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 No cortical or large  vessel territory infarction. No  mass lesion, hemorrhage, hydrocephalus or extra-axial collection. Vascular: Major vessels at the base of the brain show flow. Skull and upper cervical spine: Negative Sinuses/Orbits: Clear/normal Other: None IMPRESSION: No acute or subacute finding. Chronic small-vessel ischemic changes of the cerebral hemispheric white matter, basal ganglia and thalami. 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 No evidence of acute hemorrhage. Electronically Signed   By: Nelson Chimes M.D.   On: 01/20/2018 14:42    EKG:   Orders placed or performed during the hospital encounter of 01/20/18  . ED EKG  . ED EKG  . EKG 12-Lead  . EKG 12-Lead  . EKG 12-Lead  . EKG 12-Lead    ASSESSMENT AND PLAN:  Right lower extremity weakness- patient describes generalized weakness, but right lower extremity is notably weaker on exam. May be related to recent angiography of that leg. MRI of the brain did not show acute stroke.  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  Continue aspirin, Plavix, patient has generalized weakness due to chronic medical problems.  Likely set up home health physical therapy as patient baseline health condition is poor with chronic use of rolling walker at home not sure how rehab will help him even his chronic leg ulcers. 2.  Nonsustained V. tach in the emergency room the context of hypokalemia: Potassium being replaced.:  Potassium again today. Slightly elevated troponins likely demand ischemia in the context of ESRD. Echocardiogram results.  #3 hyperlipidemia: Continue statins 4.  Peripheral vascular disease status post nonhealing onset of the right leg, vascular surgery did angiogram of the right leg on September 12.  Continue dressing changes for the right leg and also dressing changes with the Una wraps of the left leg.  Consulted wound care. 5.  ESRD: Nephrology consult for dialysis.  Spoke with Dr. Juleen China. #6 essential  hypertension: Controlled, continue hydralazine, irbesartan, amlodipine, continue Lasix on nondialysis days.   multiple medical problems of hypertension, ESRD on hemodialysis, PAD with generalized weakness, patient alert, awake, decreased leg pain today.  Plan to consult wound care today, give at least 1 more day of physical therapy and hopefully he can go home with home health physical therapy, nursing.  All the records are reviewed and case discussed with Care Management/Social Workerr. Management plans discussed with the patient, family and they are in agreement.  CODE STATUS: full  TOTAL TIME TAKING CARE OF THIS PATIENT: 40 minutes.   POSSIBLE D/C IN 1-2 DAYS, DEPENDING ON CLINICAL CONDITION. More than 50% time spent in reviewing the old charts, talking to the patient in counseling coordination of the care.  Epifanio Lesches M.D on 01/22/2018 at 12:15 PM  Between 7am to 6pm - Pager - [phone]  After 6pm go to [email or web address] - password [email or web address] [facility]  Office  [phone]  CC: Primary care physician; [name], MD   Note: This dictation was prepared with Dragon dictation along with smaller phrase technology. Any transcriptional errors that result from this process are unintentional.

## 2018-01-22 NOTE — Progress Notes (Signed)
Post HD assessment. Pt tolerated tx well without c/o or complication. Net UF 1079, pt off tx early per MD orders, goal not met.    01/22/18 1839  Vital Signs  Temp 97.9 F (36.6 C)  Temp Source Oral  Pulse Rate 100  Pulse Rate Source Monitor  Resp 11  BP (!) 152/83  BP Location Right Arm  BP Method Automatic  Patient Position (if appropriate) Lying  Oxygen Therapy  SpO2 100 %  O2 Device Room Air  Dialysis Weight  Weight 63.7 kg  Type of Weight Post-Dialysis  Post-Hemodialysis Assessment  Rinseback Volume (mL) 250 mL  KECN 50.1 V  Dialyzer Clearance Lightly streaked  Duration of HD Treatment -hour(s) 2.5 hour(s)  Hemodialysis Intake (mL) 500 mL  UF Total -Machine (mL) 1579 mL  Net UF (mL) 1079 mL  Tolerated HD Treatment Yes  AVG/AVF Arterial Site Held (minutes) 10 minutes  AVG/AVF Venous Site Held (minutes) 10 minutes  Education / Care Plan  Dialysis Education Provided Yes  Documented Education in Care Plan Yes  Fistula / Graft Left Upper arm  No Placement Date or Time found.   Orientation: Left  Access Location: Upper arm  Site Condition No complications  Fistula / Graft Assessment Present;Thrill;Bruit  Status Deaccessed  Drainage Description None

## 2018-01-22 NOTE — Progress Notes (Signed)
*  PRELIMINARY RESULTS* Echocardiogram 2D Echocardiogram has been performed.  Johnny [name] 01/22/2018, 2:54 PM

## 2018-01-22 NOTE — Progress Notes (Signed)
[facility]  Daily Progress Note   Subjective  - * No surgery found *  Back in hospital Seen while on dialysis Had arrhythmias and weakness Still complains of a lot of foot pain, R>L   Objective Vitals:   01/22/18 1207 01/22/18 1525 01/22/18 1540 01/22/18 1545  BP: 134/75 (!) 142/79 128/74 139/81  Pulse: 100 96 96 94  Resp: 18 12 15 14   Temp: 98.5 F (36.9 C) 98.8 F (37.1 C)    TempSrc: Oral Oral    SpO2: 100% 100% 100% 100%  Weight:  64.4 kg    Height:        Intake/Output Summary (Last 24 hours) at 01/22/2018 1559 Last data filed at 01/22/2018 0950 Gross per 24 hour  Intake 360 ml  Output -  Net 360 ml    PULM  CTAB CV  RRR VASC  2+ right AT pulse, 1+ right PT pulse.  Left foot in an UNNA boot making it harder to feel, but these were palpable last week  Laboratory CBC    Component Value Date/Time   WBC 9.1 01/21/2018 0556   HGB 11.3 (L) 01/21/2018 0556   HGB 8.8 (L) 01/17/2014 0502   HCT 33.9 (L) 01/21/2018 0556   HCT 27.9 (L) 01/17/2014 0502   PLT 210 01/21/2018 0556   PLT 212 01/17/2014 0502    BMET    Component Value Date/Time   NA 143 01/21/2018 0556   NA 139 01/17/2014 0502   K 3.2 (L) 01/21/2018 0556   K 4.8 01/17/2014 0502   CL 102 01/21/2018 0556   CL 102 01/17/2014 0502   CO2 30 01/21/2018 0556   CO2 27 01/17/2014 0502   GLUCOSE 89 01/21/2018 0556   GLUCOSE 101 (H) 01/17/2014 0502   BUN 23 01/21/2018 0556   BUN 62 (H) 01/17/2014 0502   CREATININE 7.29 (H) 01/21/2018 0556   CREATININE 12.30 (H) 01/17/2014 0502   CALCIUM 10.0 01/21/2018 0556   CALCIUM 8.2 (L) 01/17/2014 0502   GFRNONAA 7 (L) 01/21/2018 0556   GFRNONAA 4 (L) 01/17/2014 0502   GFRAA 8 (L) 01/21/2018 0556   GFRAA 4 (L) 01/17/2014 0502    Assessment/Planning: POD #4 s/p RLE revascularization   Has had left leg intervention as well  Perfusion appears to be intact and has good flow distally  Still with ulcers and pain on the feet  Do not  have much more to offer from vascular POV other than amputations at this point.  He see podiatry either here or as an outpatient for follow up of wounds and pain  Will likely need placement on discharge.  Multiple returns to hospital and very little help at home    [facility]  01/22/2018, 3:59 PM

## 2018-01-23 LAB — BASIC METABOLIC PANEL
ANION GAP: 13 (ref 5–15)
BUN: 21 mg/dL (ref 8–23)
CALCIUM: 9.6 mg/dL (ref 8.9–10.3)
CO2: 29 mmol/L (ref 22–32)
CREATININE: 6.27 mg/dL — AB (ref 0.61–1.24)
Chloride: 99 mmol/L (ref 98–111)
GFR calc Af Amer: 9 mL/min — ABNORMAL LOW (ref >60)
GFR, EST NON AFRICAN AMERICAN: 8 mL/min — AB (ref >60)
GLUCOSE: 84 mg/dL (ref 70–99)
Potassium: 3.5 mmol/L (ref 3.5–5.1)
Sodium: 141 mmol/L (ref 135–145)

## 2018-01-23 MED ORDER — OXYCODONE-ACETAMINOPHEN 5-325 MG PO TABS
1.0000 | ORAL_TABLET | Freq: Four times a day (QID) | ORAL | Status: DC | PRN
  Administered 2018-01-23 (×2): 1 via ORAL
  Administered 2018-01-24: 2 via ORAL
  Filled 2018-01-23: qty 2
  Filled 2018-01-23 (×2): qty 1

## 2018-01-23 NOTE — Consult Note (Signed)
[facility] Podiatry                                                      Patient Demographics  Johnny [name], is a 67 y.o. male   MRN: [id number]   DOB - [birth year]  Admit Date - 01/20/2018    Outpatient Primary MD for the patient is [name], MD  Consult requested in the Hospital by Epifanio Lesches, MD, On 01/23/2018    Reason for consult wounds to the right foot   With History of -  Past Medical History:  Diagnosis Date  . Anemia   . Anxiety   . CHF (congestive heart failure) ([place])   . Chronic kidney disease   . Gout   . Hyperlipidemia   . Hypertension   . Peripheral vascular disease [facility])       Past Surgical History:  Procedure Laterality Date  . A/V SHUNTOGRAM Left 06/21/2017   Procedure: A/V SHUNTOGRAM;  Surgeon: Katha Cabal, MD;  Location: [place] CV LAB;  Service: Cardiovascular;  Laterality: Left;  . AV FISTULA PLACEMENT Left 09/18/2015   Procedure: INSERTION OF ARTERIOVENOUS (AV) GORE-TEX GRAFT ARM ( BRACH/AXILLARY GRAFT W/ INSTANT STICK GRAFT );  Surgeon: Katha Cabal, MD;  Location: [facility] ORS;  Service: Vascular;  Laterality: Left;  . DIALYSIS FISTULA CREATION    . ESOPHAGOGASTRODUODENOSCOPY N/A 12/19/2017   Procedure: ESOPHAGOGASTRODUODENOSCOPY (EGD);  Surgeon: Lin Landsman, MD;  Location: [facility] ENDOSCOPY;  Service: Gastroenterology;  Laterality: N/A;  . LOWER EXTREMITY ANGIOGRAPHY Left 11/16/2017   Procedure: LOWER EXTREMITY ANGIOGRAPHY;  Surgeon: Algernon Huxley, MD;  Location: [place] [facility];  Service: Cardiovascular;  Laterality: Left;  . LOWER EXTREMITY ANGIOGRAPHY Right 01/18/2018   Procedure: LOWER EXTREMITY ANGIOGRAPHY;  Surgeon: Algernon Huxley, MD;  Location: [place] [facility];  Service: Cardiovascular;  Laterality: Right;  . PERIPHERAL VASCULAR CATHETERIZATION Left 09/01/2015   Procedure: A/V  Shuntogram/Fistulagram;  Surgeon: Katha Cabal, MD;  Location: [place] [facility] LAB;  Service: Cardiovascular;  Laterality: Left;  . PERIPHERAL VASCULAR CATHETERIZATION N/A 09/30/2015   Procedure: A/V Shuntogram/Fistulagram with perm cathether removal;  Surgeon: Algernon Huxley, MD;  Location: [place] [facility];  Service: Cardiovascular;  Laterality: N/A;  . PERIPHERAL VASCULAR CATHETERIZATION Left 09/30/2015   Procedure: A/V Shunt Intervention;  Surgeon: Algernon Huxley, MD;  Location: [place] [facility];  Service: Cardiovascular;  Laterality: Left;  . PERIPHERAL VASCULAR CATHETERIZATION Left 12/03/2015   Procedure: Thrombectomy;  Surgeon: Algernon Huxley, MD;  Location: [place] [facility] LAB;  Service: Cardiovascular;  Laterality: Left;  . PERIPHERAL VASCULAR CATHETERIZATION Left 01/28/2016   Procedure: Thrombectomy;  Surgeon: Algernon Huxley, MD;  Location: [place] [facility] LAB;  Service: Cardiovascular;  Laterality: Left;  . PERIPHERAL VASCULAR CATHETERIZATION N/A 01/28/2016   Procedure: A/V Shuntogram/Fistulagram;  Surgeon: Algernon Huxley, MD;  Location: [place] [facility];  Service: Cardiovascular;  Laterality: N/A;    in for   Chief Complaint  Patient presents with  . Weakness     HPI  Johnny [name]  is a 67 y.o. male, patient is a 67 year old African-American male history of peripheral arterial disease.  Dr. Leotis Pain has recently done an angioplasty on his right lower extremity to improve arterial flow.  Patient states his leg feels better at this  point.  No history of some nonhealing ulcers to the right fifth toe and the right heel and Achilles regions.    Review of Systems    In addition to the HPI above,  No Fever-chills, No Headache, No changes with Vision or hearing, No problems swallowing food or Liquids, No Chest pain, Cough or Shortness of Breath, No Abdominal pain, No Nausea or Vommitting, Bowel movements are regular, No Blood in stool or Urine, No dysuria, No new skin  rashes or bruises, No new joints pains-aches,  No new weakness, tingling, numbness in any extremity, No recent weight gain or loss, No polyuria, polydypsia or polyphagia, No significant Mental Stressors.  A full 10 point Review of Systems was done, except as stated above, all other Review of Systems were negative.   Social History Social History   Tobacco Use  . Smoking status: Former Research scientist (life sciences)  . Smokeless tobacco: Never Used  Substance Use Topics  . Alcohol use: No    Family History Family History  Problem Relation Age of Onset  . Hypertension Unknown   . Heart disease Unknown     Prior to Admission medications   Medication Sig Start Date End Date Taking? Authorizing Provider  allopurinol (ZYLOPRIM) 100 MG tablet Take 100 mg by mouth daily.    [provider]  amLODipine (NORVASC) 10 MG tablet Take 10 mg by mouth daily.     [provider]  aspirin EC 81 MG tablet Take 1 tablet (81 mg total) by mouth daily. Patient not taking: Reported on 01/20/2018 01/18/18   Algernon Huxley, MD  atorvastatin (LIPITOR) 10 MG tablet Take 1 tablet by mouth daily. 11/23/17   [provider]  Calcium Gluconate 50 MG TABS Take 50 mg by mouth 3 (three) times daily.  05/19/11   [provider]  clonazePAM (KLONOPIN) 0.5 MG tablet Take 0.25 mg by mouth at bedtime.     [provider]  clopidogrel (PLAVIX) 75 MG tablet Take 1 tablet (75 mg total) by mouth daily. Patient not taking: Reported on 01/20/2018 12/03/15   Algernon Huxley, MD  colchicine 0.6 MG tablet Take 0.6 mg by mouth daily as needed (for gout flares).     [provider]  collagenase (SANTYL) ointment Apply topically daily. Patient not taking: Reported on 01/20/2018 11/30/17   Bettey Costa, MD  famotidine (PEPCID) 20 MG tablet Take 1 tablet (20 mg total) by mouth daily. Patient not taking: Reported on 01/20/2018 12/25/17   Fritzi Mandes, MD  folic acid-vitamin b complex-vitamin c-selenium-zinc  (DIALYVITE) 3 MG TABS tablet Take by mouth.    [provider]  furosemide (LASIX) 40 MG tablet Take 40 mg by mouth daily.     [provider]  gabapentin (NEURONTIN) 100 MG capsule Take 200 mg by mouth 2 (two) times daily.    [provider]  hydrALAZINE (APRESOLINE) 25 MG tablet Take 25 mg by mouth daily.     [provider]  irbesartan (AVAPRO) 300 MG tablet Take 300 mg by mouth daily.     [provider]  lidocaine-prilocaine (EMLA) cream Apply 1 application topically as needed (port access).    [provider]  multivitamin (RENA-VIT) TABS tablet Take 1 tablet by mouth at bedtime. Patient not taking: Reported on 01/20/2018 12/25/17   Fritzi Mandes, MD  multivitamin-lutein [facility]) CAPS capsule Take 1 capsule by mouth daily. Patient not taking: Reported on 01/20/2018 12/25/17   Fritzi Mandes, MD  Nutritional Supplements (FEEDING SUPPLEMENT, NEPRO  CARB STEADY,) LIQD Take 237 mLs by mouth 2 (two) times daily between meals. Patient not taking: Reported on 01/20/2018 12/25/17   Fritzi Mandes, MD  ondansetron (ZOFRAN ODT) 4 MG disintegrating tablet Take 1 tablet (4 mg total) by mouth every 8 (eight) hours as needed for nausea or vomiting. Patient not taking: Reported on 01/20/2018 12/18/17   Earleen Newport, MD  pregabalin (LYRICA) 50 MG capsule Take 50 mg by mouth daily. After each dialysis session    [provider]  traMADol (ULTRAM) 50 MG tablet One to Two Tabs By Mouth Every 8 Hours As Needed For Pain Patient not taking: Reported on 01/20/2018 12/25/17   Fritzi Mandes, MD    Anti-infectives (From admission, onward)   None      Scheduled Meds: . allopurinol  100 mg Oral Daily  . amLODipine  10 mg Oral Daily  . aspirin EC  81 mg Oral Daily  . atorvastatin  10 mg Oral q1800  . calcium carbonate  200 mg of elemental calcium Oral TID  . clopidogrel  75 mg Oral Daily  . famotidine  20 mg Oral Daily  . feeding supplement (NEPRO  CARB STEADY)  237 mL Oral BID BM  . furosemide  40 mg Oral Once per day on Sun Tue Thu Sat  . heparin  5,000 Units Subcutaneous Q8H  . hydrALAZINE  25 mg Oral Daily  . irbesartan  300 mg Oral Daily  . multivitamin  1 tablet Oral QHS  . multivitamin-lutein  1 capsule Oral Daily   Continuous Infusions: PRN Meds:.acetaminophen **OR** acetaminophen, clonazePAM, labetalol, morphine injection, ondansetron **OR** ondansetron (ZOFRAN) IV, oxyCODONE-acetaminophen, polyethylene glycol  Allergies  Allergen Reactions  . Shellfish Allergy Anaphylaxis    Physical Exam  Vitals  Blood pressure 138/78, pulse 98, temperature 97.7 F (36.5 C), temperature source Oral, resp. rate 16, height 5\' 8"  (1.727 m), weight 63.7 kg, SpO2 100 %.  Lower Extremity exam:  Vascular: Palpable DP pulse on the right foot.  Left foot leg is wrapped in Unna boot which I will remove at this point.  PT pulses difficult to palpate.  Dermatological: Patient has lost the nail on the fifth toe of the right foot there is a dry scab type material here but no specific ulceration or necrosis is noted the overall color the toe looks okay. There is a couple of ulcerations on the posterior right Achilles area and the posterior medial heel these are fairly superficial and did not appear to penetrate down to the deeper level.  No evidence of redness cellulitis or infection is noted to any of these regions.  Neurological: Likely some peripheral neuropathy  Ortho: Unremarkable at this stage.  Data Review  CBC Recent Labs  Lab 01/20/18 0731 01/21/18 0556  WBC 11.5* 9.1  HGB 11.7* 11.3*  HCT 36.4* 33.9*  PLT 205 210  MCV 97.0 95.8  MCH 31.1 31.8  MCHC 32.1 33.2  RDW 15.3* 15.7*   ------------------------------------------------------------------------------------------------------------------  Chemistries  Recent Labs  Lab 01/20/18 0925 01/21/18 0556 01/23/18 0449  NA 140 143 141  K 3.0* 3.2* 3.5  CL 98 102 99   CO2 29 30 29   GLUCOSE 90 89 84  BUN 17 23 21   CREATININE 5.66* 7.29* 6.27*  CALCIUM 10.3 10.0 9.6  AST 26  --   --   ALT 8  --   --   ALKPHOS 55  --   --   BILITOT 0.6  --   --    -----------------------------------------------------------------------------------  Assessment & Plan: The fifth toe on the right foot looks fairly stable.  Surprisingly has palpable DP pulse.  Not sure about the patency of the posterior tibial artery.  Flow to the posterior heel was always limited as compared to other regions in the foot and lower leg.  Wounds are present on the heel but there is superficial and did not appear to be infected at this point.  Had a recent angioplasty with some success.  Change dressing daily to put a wet-to-dry dressing on the heel and on the fifth toe.  Put extra gauze padding especially on the heel region.  I will write orders for the nursing staff to keep pillows underneath his posterior leg to try to prevent pressure on the heel regions.  Of Naprosyn I will speak with Dr. Lucky Cowboy about his potential plans for the patient rated at all to do anything more I can do for him at this point.  Active Problems:   Weakness of right lower extremity   Family Communication: Plan discussed with patient   Albertine Patricia M.D on 01/23/2018 at 7:27 PM  Thank you for the consult, we will follow the patient with you in the Hospital.

## 2018-01-23 NOTE — Progress Notes (Signed)
[facility]  ROUNDING NOTE   Subjective:   Patient is asking for another opinion about his ulcer and peripheral vascular disease.   Hemodialysis treatment yesterday. Tolerated treatment well. Unfortunately due to another emergency, patient was unable to complete his treatment. Received 2 hours of hemodialysis yesterday.  Complains of confusion.   Objective:  Vital signs in last 24 hours:  Temp:  [97.9 F (36.6 C)-98.9 F (37.2 C)] 98.8 F (37.1 C) (09/17 1342) Pulse Rate:  [94-110] 96 (09/17 1342) Resp:  [11-24] 20 (09/17 0456) BP: (116-162)/(66-94) 116/66 (09/17 1342) SpO2:  [97 %-100 %] 100 % (09/17 1342) Weight:  [63.7 kg-64.4 kg] 63.7 kg (09/16 1839)  Weight change:  Filed Weights   01/20/18 1235 01/22/18 1525 01/22/18 1839  Weight: 62.9 kg 64.4 kg 63.7 kg    Intake/Output: I/O last 3 completed shifts: In: 360 [P.O.:360] Out: 1079 [Other:1079]   Intake/Output this shift:  No intake/output data recorded.  Physical Exam: General: NAD, laying in bed  Head: Normocephalic, atraumatic. Moist oral mucosal membranes  Eyes: Anicteric, PERRL  Neck: Supple, trachea midline  Lungs:  Clear to auscultation  Heart: Regular rate and rhythm  Abdomen:  Soft, nontender,   Extremities: In clean and dry dressings  Neurologic: Nonfocal, moving all four extremities  Skin: No lesions  Access: Left AVG    Basic Metabolic Panel: Recent Labs  Lab 01/20/18 0925 01/21/18 0556 01/23/18 0449  NA 140 143 141  K 3.0* 3.2* 3.5  CL 98 102 99  CO2 29 30 29   GLUCOSE 90 89 84  BUN 17 23 21   CREATININE 5.66* 7.29* 6.27*  CALCIUM 10.3 10.0 9.6    Liver Function Tests: Recent Labs  Lab 01/20/18 0925  AST 26  ALT 8  ALKPHOS 55  BILITOT 0.6  PROT 7.9  ALBUMIN 3.7   No results for input(s): LIPASE, AMYLASE in the last 168 hours. No results for input(s): AMMONIA in the last 168 hours.  CBC: Recent Labs  Lab 01/20/18 0731 01/21/18 0556  WBC 11.5* 9.1  HGB  11.7* 11.3*  HCT 36.4* 33.9*  MCV 97.0 95.8  PLT 205 210    Cardiac Enzymes: Recent Labs  Lab 01/20/18 0925 01/20/18 1539 01/20/18 2128  TROPONINI 0.17* 0.17* 0.17*    BNP: Invalid input(s): POCBNP  CBG: Recent Labs  Lab 01/20/18 1502 01/21/18 1609  GLUCAP 68* 33    Microbiology: Results for orders placed or performed during the hospital encounter of 01/20/18  MRSA PCR Screening     Status: None   Collection Time: 01/20/18  1:07 PM  Result Value Ref Range Status   MRSA by PCR NEGATIVE NEGATIVE Final    Comment:        The GeneXpert MRSA Assay (FDA approved for NASAL specimens only), is one component of a comprehensive MRSA colonization surveillance program. It is not intended to diagnose MRSA infection nor to guide or monitor treatment for MRSA infections. Performed at [facility], [address] [place], [place] [address]     Coagulation Studies: No results for input(s): LABPROT, INR in the last 72 hours.  Urinalysis: No results for input(s): COLORURINE, LABSPEC, PHURINE, GLUCOSEU, HGBUR, BILIRUBINUR, KETONESUR, PROTEINUR, UROBILINOGEN, NITRITE, LEUKOCYTESUR in the last 72 hours.  Invalid input(s): APPERANCEUR    Imaging: No results found.   Medications:    . allopurinol  100 mg Oral Daily  . amLODipine  10 mg Oral Daily  . aspirin EC  81 mg Oral Daily  . atorvastatin  10 mg Oral q1800  . calcium carbonate  200 mg of elemental calcium Oral TID  . clopidogrel  75 mg Oral Daily  . famotidine  20 mg Oral Daily  . feeding supplement (NEPRO CARB STEADY)  237 mL Oral BID BM  . furosemide  40 mg Oral Once per day on Sun Tue Thu Sat  . heparin  5,000 Units Subcutaneous Q8H  . hydrALAZINE  25 mg Oral Daily  . irbesartan  300 mg Oral Daily  . multivitamin  1 tablet Oral QHS  . multivitamin-lutein  1 capsule Oral Daily   acetaminophen **OR** acetaminophen, clonazePAM, labetalol, morphine injection, ondansetron **OR** ondansetron (ZOFRAN)  IV, oxyCODONE-acetaminophen, polyethylene glycol  Assessment/ Plan:  [name] is a 67 y.o. black male with end stage renal disease on hemodialysis, congestive heart failure, hypertension, peripheral vascular disease  [place]. MWF L AVF  65kg  1. End Stage Renal Disease: hemodialysis was discontinued early yesterday. However no acute indication for dialysis today. Plan on hemodialysis for tomorrow.   2. Hypertension: blood pressure at goal.  - amlodipine, fursoemide, hydralazine, irbesartan.   3. Anemia of chronic kidney disease:   - no indication for ESA with ischemia  4. Secondary Hyperparathyroidism: labs from 9/9 PTH 291, calcium 9.8, phosphorus 5.2 - calcium carbonate with meals.   5. Peripheral vascular disease:  - Appreciate vascular input.  - Consult podiatry    LOS: 0 Johnny [name] 9/17/20193:02 PM

## 2018-01-23 NOTE — Care Management (Signed)
[facility] dialysis liaison notified of admission.  Patient open with [facility] for RN, PT, OT, SW, and aide.  PT to see patient again today. At this time patient is declining SNF

## 2018-01-23 NOTE — Clinical Social Work Note (Signed)
Clinical Social Work Assessment  Patient Details  Name: Johnny [name]. MRN: [id number] Date of Birth: 09/22/1950  Date of referral:  01/23/18               Reason for consult:  Discharge Planning                Permission sought to share information with:  Other(Brenda Glass: [phone]) Permission granted to share information::  Yes, Verbal Permission Granted  Name::        Agency::     Relationship::     Contact Information:     Housing/Transportation Living arrangements for the past 2 months:  [place], [place] of Information:  Patient, Friend/Neighbor Patient Interpreter Needed:  None Criminal Activity/Legal Involvement Pertinent to Current Situation/Hospitalization:  No - Comment as needed Significant Relationships:  None Lives with:  Self Do you feel safe going back to the place where you live?  Yes Need for family participation in patient care:  Yes (Comment)  Care giving concerns:  Patient resides at home alone. His neighbor assists him with his care.   Social Worker assessment / plan:  CSW spoke with patient this morning regarding PT recommendations. Patient states he wants to return home and does not see why he cannot. CSW discussed his inability to walk and patient disagrees that he has difficulty walking. Patient was alert and oriented X2 (confused to month, day of week).  Patient gave CSW permission to speak with his neighbor and friend: [name]: [phone]. CSW contacted [name] and she stated that she cannot help him any longer and that he will need to go to rehab and that she would call him to discuss this.   CSW returned to see patient this afternoon and he is confused and unable to answer CSW. CSW spoke with [name] after seeing patient and she stated that she does have HPOA and that is good with patient going to [facility] when time. CSW has notified [facility] to begin [place].   Employment status:    Insurance information:    PT  Recommendations:  [place] / Referral to community resources:     Patient/Family's Response to care:  [name] expressed appreciation for CSW assistance.   Patient/Family's Understanding of and Emotional Response to Diagnosis, Current Treatment, and Prognosis: Patient is not able to understand the full extent of his limitations or what a safe discharge disposition is at this time.  Emotional Assessment Appearance:  Appears older than stated age Attitude/Demeanor/Rapport:  (pleasant and cooperative) Affect (typically observed):  Calm Orientation:  Oriented to Self, Oriented to Place, Oriented to Situation Alcohol / Substance use:  Not Applicable Psych involvement (Current and /or in the community):  No (Comment)  Discharge Needs  Concerns to be addressed:  Care Coordination Readmission within the last 30 days:  Yes Current discharge risk:  None Barriers to Discharge:  No Barriers Identified   Shela Leff, LCSW 01/23/2018, 3:14 PM

## 2018-01-23 NOTE — NC FL2 (Signed)
[place] LEVEL OF CARE SCREENING TOOL     IDENTIFICATION  Patient Name: Johnny [name]. Birthdate: 1950/12/16 Sex: male Admission Date (Current Location): 01/20/2018  [place] and [place] Number:  Engineering geologist and Address:  [facility], [address] [place], [place] [address]      Provider Number: [id number]  Attending Physician Name and Address:  Epifanio Lesches, MD  Relative Name and Phone Number:       Current Level of Care: Hospital Recommended Level of Care: [place] Prior Approval Number:    Date Approved/Denied:   PASRR Number:    Discharge Plan: SNF    Current Diagnoses: Patient Active Problem List   Diagnosis Date Noted  . Weakness of right lower extremity 01/20/2018  . Fever   . Periumbilical abdominal pain   . Confusion 12/22/2017  . Acute delirium 12/21/2017  . Protein-calorie malnutrition, severe 12/19/2017  . Intractable nausea and vomiting 12/18/2017  . Lymphedema 12/13/2017  . Cellulitis 11/27/2017  . Chest pain 11/19/2017  . Atherosclerosis of native arteries of the extremities with ulceration ([place]) 11/07/2017  . Elevated troponin 10/02/2015  . Complications, dialysis, catheter, mechanical ([place]) 10/02/2015  . Musculoskeletal chest pain 09/28/2015  . ESRD on dialysis ([place]) 09/28/2015  . HTN (hypertension) 09/28/2015  . Chronic diastolic CHF (congestive heart failure) ([place]) 09/28/2015  . Gout 09/28/2015    Orientation RESPIRATION BLADDER Height & Weight     Self, Place  Normal Continent Weight: 140 lb 6.9 oz (63.7 kg) Height:  5\' 8"  (172.7 cm)  BEHAVIORAL SYMPTOMS/MOOD NEUROLOGICAL BOWEL NUTRITION STATUS  (none) (none noted) Continent Diet(renal with fluid restriction)  AMBULATORY STATUS COMMUNICATION OF NEEDS Skin   Extensive Assist Verbally Other (Comment)(venous stasis ulcers)                       Personal Care Assistance Level of Assistance  Dressing,  Feeding, Bathing Bathing Assistance: Maximum assistance Feeding assistance: Maximum assistance Dressing Assistance: Maximum assistance     Functional Limitations Info             SPECIAL CARE FACTORS FREQUENCY  PT (By licensed PT)(Hemodialysis)                    Contractures Contractures Info: Not present    Additional Factors Info  Code Status, Allergies Code Status Info: full Allergies Info: shellfish           Current Medications (01/23/2018):  This is the current hospital active medication list Current Facility-Administered Medications  Medication Dose Route Frequency Provider Last Rate Last Dose  . acetaminophen (TYLENOL) tablet 650 mg  650 mg Oral Q6H PRN Sela Hua, MD   650 mg at 01/22/18 0931   Or  . acetaminophen (TYLENOL) suppository 650 mg  650 mg Rectal Q6H PRN Mayo, Pete Pelt, MD      . allopurinol (ZYLOPRIM) tablet 100 mg  100 mg Oral Daily Mayo, Pete Pelt, MD   100 mg at 01/22/18 0931  . amLODipine (NORVASC) tablet 10 mg  10 mg Oral Daily Mayo, Pete Pelt, MD   Stopped at 01/22/18 [phone]  . aspirin EC tablet 81 mg  81 mg Oral Daily Mayo, Pete Pelt, MD   81 mg at 01/22/18 0931  . atorvastatin (LIPITOR) tablet 10 mg  10 mg Oral q1800 Mayo, Pete Pelt, MD   10 mg at 01/21/18 1702  . calcium carbonate (TUMS - dosed in mg elemental calcium)  chewable tablet 200 mg of elemental calcium  200 mg of elemental calcium Oral TID Tawnya Crook, RPH   200 mg of elemental calcium at 01/22/18 2210  . clonazePAM (KLONOPIN) tablet 0.25 mg  0.25 mg Oral QHS PRN Mayo, Pete Pelt, MD   0.25 mg at 01/20/18 2204  . clopidogrel (PLAVIX) tablet 75 mg  75 mg Oral Daily Mayo, Pete Pelt, MD   75 mg at 01/22/18 0931  . famotidine (PEPCID) tablet 20 mg  20 mg Oral Daily Mayo, Pete Pelt, MD   20 mg at 01/22/18 0931  . feeding supplement (NEPRO CARB STEADY) liquid 237 mL  237 mL Oral BID BM Mayo, Pete Pelt, MD   237 mL at 01/22/18 0938  . furosemide (LASIX) tablet 40 mg  40 mg Oral  Once per day on Sun Tue Thu Sat Sela Hua, MD   40 mg at 01/21/18 1037  . heparin injection 5,000 Units  5,000 Units Subcutaneous Q8H Mayo, Pete Pelt, MD   5,000 Units at 01/23/18 0507  . hydrALAZINE (APRESOLINE) tablet 25 mg  25 mg Oral Daily Mayo, Pete Pelt, MD   Stopped at 01/22/18 [phone]  . irbesartan (AVAPRO) tablet 300 mg  300 mg Oral Daily Mayo, Pete Pelt, MD   Stopped at 01/22/18 [phone]  . labetalol (NORMODYNE,TRANDATE) injection 10 mg  10 mg Intravenous Q10 min PRN Mayo, Pete Pelt, MD      . morphine 2 MG/ML injection 2 mg  2 mg Intravenous Q6H PRN Epifanio Lesches, MD   2 mg at 01/22/18 2222  . multivitamin (RENA-VIT) tablet 1 tablet  1 tablet Oral QHS Mayo, Pete Pelt, MD   1 tablet at 01/22/18 2210  . multivitamin-lutein (OCUVITE-LUTEIN) capsule 1 capsule  1 capsule Oral Daily Mayo, Pete Pelt, MD   1 capsule at 01/22/18 0931  . ondansetron (ZOFRAN) tablet 4 mg  4 mg Oral Q6H PRN Mayo, Pete Pelt, MD       Or  . ondansetron [facility]) injection 4 mg  4 mg Intravenous Q6H PRN Mayo, Pete Pelt, MD      . oxyCODONE-acetaminophen (PERCOCET/ROXICET) 5-325 MG per tablet 1-2 tablet  1-2 tablet Oral Q6H PRN Epifanio Lesches, MD      . polyethylene glycol (MIRALAX / GLYCOLAX) packet 17 g  17 g Oral Daily PRN Mayo, Pete Pelt, MD         Discharge Medications: Please see discharge summary for a list of discharge medications.  Relevant Imaging Results:  Relevant Lab Results:   Additional Information ss: [id number]  Shela Leff, LCSW

## 2018-01-23 NOTE — Progress Notes (Signed)
[facility] [facility] [place] NAME: Johnny [name]    MR#:  [id number]  DATE OF BIRTH:  1951/02/23  SUBJECTIVE: Patient admitted for right leg weakness but stroke work-up has been negative.  Patient has chronic right leg ulcer underwent right leg angiogram by Dr. Lucky Cowboy on September 12.  Still complains of severe bilateral leg pain, requesting pain medicines, patient has no IV access.  N.p.o. pain medicines..  CHIEF COMPLAINT:   Chief Complaint  Patient presents with  . Weakness   Patient refused skilled nursing placement wants to go home however he wants to go home tomorrow. REVIEW OF SYSTEMS:   ROS CONSTITUTIONAL: No fever,  has fatigue, weakness, poor p.o. intake patient appears chronically ill.  YES: No blurred or double vision.  EARS, NOSE, AND THROAT: No tinnitus or ear pain.  RESPIRATORY: No cough, shortness of breath, wheezing or hemoptysis.  CARDIOVASCULAR: No chest pain, orthopnea, edema.  GASTROINTESTINAL: No nausea, vomiting, diarrhea or abdominal pain.  GENITOURINARY: No dysuria, hematuria.  ENDOCRINE: No polyuria, nocturia,  HEMATOLOGY: No anemia, easy bruising or bleeding SKIN: No rash or lesion. MUSCULOSKELETAL: Patient has bilateral leg dressing present nEUROLOGIC: No tingling, numbness, weakness.  PSYCHIATRY: No anxiety or depression.   DRUG ALLERGIES:   Allergies  Allergen Reactions  . Shellfish Allergy Anaphylaxis    VITALS:  Blood pressure (!) 157/79, pulse (!) 104, temperature 98.4 F (36.9 C), temperature source Oral, resp. rate 20, height 5\' 8"  (1.727 m), weight 63.7 kg, SpO2 100 %.  PHYSICAL EXAMINATION:  GENERAL:  67 y.o.-year-old patient lying in the bed with no acute distress.,  Appears chronically ill. EYES: Pupils equal, round, reactive to light and accommodation. No scleral icterus. Extraocular muscles intact.  HEENT: Head atraumatic, normocephalic. Oropharynx and nasopharynx clear.  NECK:  Supple, no  jugular venous distention. No thyroid enlargement, no tenderness.  LUNGS: Normal breath sounds bilaterally, no wheezing, rales,rhonchi or crepitation. No use of accessory muscles of respiration.  CARDIOVASCULAR: S1, S2 normal. No murmurs, rubs, or gallops.  ABDOMEN: Soft, nontender, nondistended. Bowel sounds present. No organomegaly or mass.  EXTREMITIES: Patient has bilateral pedal edema but does have a right leg dressing, left leg wrapped. NEUROLOGIC: Cranial nerves II through XII are intact. Muscle strength 5/5 in all extremities. Sensation intact. Gait not checked.  PSYCHIATRIC: The patient is alert and oriented x 3.  SKIN: No obvious rash,+ulcer of the right 5th toe without erythema or drainage   LABORATORY PANEL:   CBC Recent Labs  Lab 01/21/18 0556  WBC 9.1  HGB 11.3*  HCT 33.9*  PLT 210   ------------------------------------------------------------------------------------------------------------------  Chemistries  Recent Labs  Lab 01/20/18 0925  01/23/18 0449  NA 140   < > 141  K 3.0*   < > 3.5  CL 98   < > 99  CO2 29   < > 29  GLUCOSE 90   < > 84  BUN 17   < > 21  CREATININE 5.66*   < > 6.27*  CALCIUM 10.3   < > 9.6  AST 26  --   --   ALT 8  --   --   ALKPHOS 55  --   --   BILITOT 0.6  --   --    < > = values in this interval not displayed.   ------------------------------------------------------------------------------------------------------------------  Cardiac Enzymes Recent Labs  Lab 01/20/18 2128  TROPONINI 0.17*   ------------------------------------------------------------------------------------------------------------------  RADIOLOGY:  No results found.  EKG:  Orders placed or performed during the hospital encounter of 01/20/18  . ED EKG  . ED EKG  . EKG 12-Lead  . EKG 12-Lead  . EKG 12-Lead  . EKG 12-Lead    ASSESSMENT AND PLAN:  Right lower extremity weakness- patient describes generalized weakness, but right lower extremity  is notably weaker on exam. May be related to recent angiography of that leg. MRI of the brain did not show acute stroke.  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  echo Cardiogram is done but results are pending.  Continue aspirin, Plavix, physical therapy, occupational therapy consult.  2.  Nonsustained V. tach in the emergency room the context of hypokalemia: Potassium being replaced.:  Improved.  No further V. tach.  Slightly elevated troponins likely demand ischemia in the context of ESRD.  Check echocardiogram.  Echo is done but results are pending.   #3 hyperlipidemia: Continue statins  4.  Peripheral vascular disease status post nonhealing onset of the right leg, vascular surgery did angiogram of the right leg on September 12.  Continue dressing changes for the right leg and also dressing changes with the Una wraps of the left leg.  In by Dr. Lucky Cowboy from vascular, perfusion appears to be intact with good flow.  Still has right leg ulcers with pain on the feet.  Continue p.o. Percocet.  Refusing placement likely discharge home with home health PT, home health aid tomorrow.   5.  ESRD: On hemodialysis Monday, Wednesday, Friday.  All the records are reviewed and case discussed with Care Management/Social Workerr. Management plans discussed with the patient, family and they are in agreement.  CODE STATUS: full  TOTAL TIME TAKING CARE OF THIS PATIENT: 40 minutes.   POSSIBLE D/C IN 1-2 DAYS, DEPENDING ON CLINICAL CONDITION. More than 50% time spent in reviewing the old charts, talking to the patient in counseling coordination of the care.  Epifanio Lesches M.D on 01/23/2018 at 12:53 PM  Between 7am to 6pm - Pager - [phone]  After 6pm go to [email or web address] - password [email or web address] [facility]  Office  [phone]  CC: Primary care physician; [name], MD   Note: This dictation was prepared with Dragon dictation along with smaller phrase technology. Any transcriptional  errors that result from this process are unintentional.

## 2018-01-24 LAB — ECHOCARDIOGRAM COMPLETE
HEIGHTINCHES: 68 in
WEIGHTICAEL: 2218.71 [oz_av]

## 2018-01-24 MED ORDER — PENTAFLUOROPROP-TETRAFLUOROETH EX AERO
INHALATION_SPRAY | CUTANEOUS | Status: DC | PRN
  Administered 2018-01-24: 09:00:00 via TOPICAL
  Filled 2018-01-24 (×2): qty 30

## 2018-01-24 MED ORDER — OXYCODONE-ACETAMINOPHEN 5-325 MG PO TABS: 1.0000 | ORAL_TABLET | Freq: Four times a day (QID) | ORAL | 0 refills | Status: DC | PRN

## 2018-01-24 MED ORDER — ACETAMINOPHEN 325 MG PO TABS: 650.0000 mg | ORAL_TABLET | Freq: Four times a day (QID) | ORAL | Status: DC | PRN

## 2018-01-24 MED ORDER — CALCIUM CARBONATE ANTACID 500 MG PO CHEW: 200.0000 mg | CHEWABLE_TABLET | Freq: Three times a day (TID) | ORAL | 0 refills | Status: DC

## 2018-01-24 MED ORDER — METOPROLOL SUCCINATE ER 25 MG PO TB24: 25.0000 mg | ORAL_TABLET | Freq: Every day | ORAL | 0 refills | Status: DC

## 2018-01-24 MED ORDER — FLEET ENEMA 7-19 GM/118ML RE ENEM: 1.0000 | ENEMA | RECTAL | Status: DC

## 2018-01-24 MED ORDER — CLONAZEPAM 0.5 MG PO TABS: 0.2500 mg | ORAL_TABLET | Freq: Two times a day (BID) | ORAL | 0 refills | Status: DC | PRN

## 2018-01-24 MED ORDER — POLYETHYLENE GLYCOL 3350 17 G PO PACK: 17.0000 g | PACK | Freq: Every day | ORAL | 0 refills | Status: DC | PRN

## 2018-01-24 NOTE — Discharge Summary (Signed)
[place] [place] NAME: Johnny [name]    MR#:  [id number]  DATE OF BIRTH:  24-Apr-1951  DATE OF ADMISSION:  01/20/2018 ADMITTING PHYSICIAN: Sela Hua, MD  DATE OF DISCHARGE: 01/24/2018  PRIMARY CARE PHYSICIAN: [name], MD    ADMISSION DIAGNOSIS:  Ventricular tachycardia ([place]) [I47.2] Weakness [R53.1]  DISCHARGE DIAGNOSIS:  Active Problems:   Weakness of right lower extremity   SECONDARY DIAGNOSIS:   Past Medical History:  Diagnosis Date  . Anemia   . Anxiety   . CHF (congestive heart failure) ([place])   . Chronic kidney disease   . Gout   . Hyperlipidemia   . Hypertension   . Peripheral vascular disease ([place])     HOSPITAL COURSE:   1.  Right lower extremity weakness and chronic wounds.  Patient had a recent angiographic of the leg.  Stroke work-up negative.  Continue aspirin and Plavix.  Physical therapy recommended rehab. 2.  Nonsustained ventricular tachycardia in the emergency room in the context of hypokalemia.  Potassium replaced.  No further ventricular tachycardia.  Will add low-dose Toprol-XL at night. 3.  Elevated troponin secondary to demand ischemia and likely false positive with end-stage renal disease. 4.  End-stage renal disease on hemodialysis Monday Wednesday and Friday. 5.  Peripheral vascular disease.  Nonhealing ulcerations bilateral lower extremities.  Patient has an Haematologist left leg.  Wet-to-dry dressing on the heel and fifth toe and nonstick dressing over the other wounds on the right leg.  As per Dr. Fenton Foy note high risk for amputation. 6.  History of chronic diastolic congestive heart failure.  Dialysis to remove fluid. 7.  History of gout  DISCHARGE CONDITIONS:   Fair  CONSULTS OBTAINED:  Treatment Team:  Lavonia Dana, MD Algernon Huxley, MD Albertine Patricia, [place]  DRUG ALLERGIES:   Allergies  Allergen Reactions  . Shellfish Allergy Anaphylaxis    DISCHARGE MEDICATIONS:    Allergies as of 01/24/2018      Reactions   Shellfish Allergy Anaphylaxis      Medication List    STOP taking these medications   Calcium Gluconate 50 MG Tabs   collagenase ointment Commonly known as:  SANTYL   famotidine 20 MG tablet Commonly known as:  PEPCID   gabapentin 100 MG capsule Commonly known as:  NEURONTIN   lidocaine-prilocaine cream Commonly known as:  EMLA   ondansetron 4 MG disintegrating tablet Commonly known as:  ZOFRAN-ODT   pregabalin 50 MG capsule Commonly known as:  LYRICA   traMADol 50 MG tablet Commonly known as:  ULTRAM     TAKE these medications   acetaminophen 325 MG tablet Commonly known as:  TYLENOL Take 2 tablets (650 mg total) by mouth every 6 (six) hours as needed for mild pain (or Fever >/= 101).   allopurinol 100 MG tablet Commonly known as:  ZYLOPRIM Take 100 mg by mouth daily.   amLODipine 10 MG tablet Commonly known as:  NORVASC Take 10 mg by mouth daily.   aspirin EC 81 MG tablet Take 1 tablet (81 mg total) by mouth daily.   atorvastatin 10 MG tablet Commonly known as:  LIPITOR Take 1 tablet by mouth daily.   calcium carbonate 500 MG chewable tablet Commonly known as:  TUMS - dosed in mg elemental calcium Chew 1 tablet (200 mg of elemental calcium total) by mouth 3 (three) times daily.   clonazePAM 0.5 MG tablet Commonly known as:  KLONOPIN Take 0.5  tablets (0.25 mg total) by mouth 2 (two) times daily as needed (insomnia). What changed:    when to take this  reasons to take this   clopidogrel 75 MG tablet Commonly known as:  PLAVIX Take 1 tablet (75 mg total) by mouth daily.   colchicine 0.6 MG tablet Take 0.6 mg by mouth daily as needed (for gout flares).   feeding supplement (NEPRO CARB STEADY) Liqd Take 237 mLs by mouth 2 (two) times daily between meals.   folic acid-vitamin b complex-vitamin c-selenium-zinc 3 MG Tabs tablet Take by mouth.   furosemide 40 MG tablet Commonly known as:   LASIX Take 40 mg by mouth daily.   hydrALAZINE 25 MG tablet Commonly known as:  APRESOLINE Take 25 mg by mouth daily.   irbesartan 300 MG tablet Commonly known as:  AVAPRO Take 300 mg by mouth daily.   metoprolol succinate 25 MG 24 hr tablet Commonly known as:  TOPROL-XL Take 1 tablet (25 mg total) by mouth at bedtime.   multivitamin Tabs tablet Take 1 tablet by mouth at bedtime.   multivitamin-lutein Caps capsule Take 1 capsule by mouth daily.   oxyCODONE-acetaminophen 5-325 MG tablet Commonly known as:  PERCOCET/ROXICET Take 1 tablet by mouth every 6 (six) hours as needed for moderate pain or severe pain.   polyethylene glycol packet Commonly known as:  MIRALAX / GLYCOLAX Take 17 g by mouth daily as needed for mild constipation.        DISCHARGE INSTRUCTIONS:   Follow-up with [facility] 1 day Dialysis Monday Wednesday Friday Dr. Lucky Cowboy vascular surgery Dr. Elvina Mattes podiatry  If you experience worsening of your admission symptoms, develop shortness of breath, life threatening emergency, suicidal or homicidal thoughts you must seek medical attention immediately by calling 911 or calling your MD immediately  if symptoms less severe.  You Must read complete instructions/literature along with all the possible adverse reactions/side effects for all the Medicines you take and that have been prescribed to you. Take any new Medicines after you have completely understood and accept all the possible adverse reactions/side effects.   Please note  You were cared for by a hospitalist during your hospital stay. If you have any questions about your discharge medications or the care you received while you were in the hospital after you are discharged, you can call the unit and asked to speak with the hospitalist on call if the hospitalist that took care of you is not available. Once you are discharged, your primary care physician will handle any further medical issues. Please note that  NO REFILLS for any discharge medications will be authorized once you are discharged, as it is imperative that you return to your primary care physician (or establish a relationship with a primary care physician if you do not have one) for your aftercare needs so that they can reassess your need for medications and monitor your lab values.    Today   CHIEF COMPLAINT:   Chief Complaint  Patient presents with  . Weakness    HISTORY OF PRESENT ILLNESS:  Johnny [name]  is a 67 y.o. male came in with weakness   VITAL SIGNS:  Blood pressure 139/85, pulse (!) 105, temperature (!) 97.5 F (36.4 C), temperature source Oral, resp. rate 16, height 5\' 8"  (1.727 m), weight 62.6 kg, SpO2 100 %.    PHYSICAL EXAMINATION:  GENERAL:  67 y.o.-year-old patient lying in the bed with no acute distress.  EYES: Pupils equal, round, reactive to light and  accommodation. No scleral icterus. Extraocular muscles intact.  HEENT: Head atraumatic, normocephalic. Oropharynx and nasopharynx clear.  NECK:  Supple, no jugular venous distention. No thyroid enlargement, no tenderness.  LUNGS: Normal breath sounds bilaterally, no wheezing, rales,rhonchi or crepitation. No use of accessory muscles of respiration.  CARDIOVASCULAR: S1, S2 normal. No murmurs, rubs, or gallops.  ABDOMEN: Soft, non-tender, non-distended. Bowel sounds present. No organomegaly or mass.  EXTREMITIES: No pedal edema, cyanosis, or clubbing.  NEUROLOGIC: Cranial nerves II through XII are intact. Muscle strength 5/5 in all extremities. Sensation intact. Gait not checked.  PSYCHIATRIC: The patient is alert and oriented x 3.  SKIN: Left leg covered with Unna boot.  Right leg chronic ulcerations posterior calf heel and fifth toe.  DATA REVIEW:   CBC Recent Labs  Lab 01/21/18 0556  WBC 9.1  HGB 11.3*  HCT 33.9*  PLT 210    Chemistries  Recent Labs  Lab 01/20/18 0925  01/23/18 0449  NA 140   < > 141  K 3.0*   < > 3.5  CL 98   < > 99   CO2 29   < > 29  GLUCOSE 90   < > 84  BUN 17   < > 21  CREATININE 5.66*   < > 6.27*  CALCIUM 10.3   < > 9.6  AST 26  --   --   ALT 8  --   --   ALKPHOS 55  --   --   BILITOT 0.6  --   --    < > = values in this interval not displayed.    Cardiac Enzymes Recent Labs  Lab 01/20/18 2128  TROPONINI 0.17*    Microbiology Results  Results for orders placed or performed during the hospital encounter of 01/20/18  MRSA PCR Screening     Status: None   Collection Time: 01/20/18  1:07 PM  Result Value Ref Range Status   MRSA by PCR NEGATIVE NEGATIVE Final    Comment:        The GeneXpert MRSA Assay (FDA approved for NASAL specimens only), is one component of a comprehensive MRSA colonization surveillance program. It is not intended to diagnose MRSA infection nor to guide or monitor treatment for MRSA infections. Performed at [facility], [address], [place], [place] [address]       Management plans discussed with the patient, friend that is the POA and they are in agreement.  CODE STATUS:     Code Status Orders  (From admission, onward)         Start     Ordered   01/20/18 1234  Full code  Continuous     01/20/18 1233        Code Status History    Date Active Date Inactive Code Status Order ID Comments User Context   01/18/2018 1230 01/19/2018 1018 Full Code [id number]  Algernon Huxley, MD Inpatient   12/19/2017 0112 12/25/2017 2152 Full Code [id number]  Lance Coon, MD Inpatient   11/27/2017 1831 12/01/2017 2320 Full Code [id number]  Saundra Shelling, MD Inpatient   11/19/2017 0507 11/21/2017 0009 Full Code [id number]  Harrie Foreman, MD Inpatient   11/16/2017 1205 11/16/2017 1717 Full Code [id number]  Algernon Huxley, MD Inpatient   01/28/2016 1050 01/28/2016 1456 Full Code [id number]  Algernon Huxley, MD Inpatient   09/28/2015 2306 10/02/2015 1738 Full Code [id number]  Lance Coon, MD Inpatient      TOTAL TIME TAKING CARE  OF THIS PATIENT: 35 minutes.     Loletha Grayer M.D on 01/24/2018 at 2:06 PM  Between 7am to 6pm - Pager - [phone]  After 6pm go to [email or web address] - password [email or web address]  [facility] Office  [phone]  CC: Primary care physician; [name], MD

## 2018-01-24 NOTE — Progress Notes (Signed)
HD Pre Assessment    01/24/18 0850  Neurological  Level of Consciousness Alert  Orientation Level Oriented to person;Oriented to place;Oriented to situation;Disoriented to time  Respiratory  Respiratory Pattern Regular;Unlabored;Symmetrical  Chest Assessment Chest expansion symmetrical  Bilateral Breath Sounds Clear  Cardiac  Pulse Regular  Heart Sounds S1, S2  Jugular Venous Distention (JVD) No  ECG Monitor Yes  Cardiac Rhythm NSR  Antiarrhythmic device No  Vascular  R Radial Pulse +1  L Radial Pulse +1  Edema Generalized  Generalized Edema +1  Integumentary  Integumentary (WDL) X  Skin Color Appropriate for ethnicity  Skin Condition Dry;Flaky  Musculoskeletal  Musculoskeletal (WDL) X  Generalized Weakness Yes  GU Assessment  Genitourinary (WDL) X (HD pt)  Psychosocial  Psychosocial (WDL) WDL

## 2018-01-24 NOTE — Clinical Social Work Note (Signed)
Patient to discharge today to [facility]. [facility] received [place]. CSW has sent discharge information. MD has spoken to [name], patient's HPOA.  Shela Leff MSW,LCSW [phone]

## 2018-01-24 NOTE — Progress Notes (Signed)
Physical Therapy Treatment Patient Details Name: Johnny [name]. MRN: [id number] DOB: 04-23-1951 Today's Date: 01/24/2018    History of Present Illness Johnny [name]  is a 67 y.o. male who presents with a known history of HTN, chronic diastolic heart failure, ESRD on HD, HLD, and PAD, presenting with generalized weakness. Pt admitted with generalized weakness and confusion upon arrival. Patient just underwent right lower extremity angiography on 01/18/18. 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     PT Comments    Pt with improved effort this date, however demonstrating mild verbal agitation and confusion during treatment.  Pt unable to identify the date and confused about prior knowledge of hospital stay. Pt able to perform basic bed mobility with increased time needed but no physical assist required. During sit to stand, pt unable to follow instruction for hand placement and safety. Although able to complete the transfer successfully it was unsafe and pt unable to demonstrate learning of the proper technique. Pt ambulated 25 feet two times with significant pain and offloading of R leg using UE on RW. Pt reporting he does not like to use a walker because he feels it is more unsafe. Pt is unsafe to perform basic transfers, household ambulation, and ADL's independently and does not have a proper support system to provide assistance. Pt would continue to benefit most in a SNF to improve safety and functional mobility in preparation to return to independency.    Follow Up Recommendations  SNF     Equipment Recommendations       Recommendations for Other Services       Precautions / Restrictions Precautions Precautions: Fall Restrictions Weight Bearing Restrictions: No    Mobility  Bed Mobility Overal bed mobility: Needs Assistance Bed Mobility: Supine to Sit;Sit to Supine     Supine to sit: Supervision Sit to supine: Supervision   General bed mobility comments: increased  time, significant effort and pain noted during transfer, pt requiring significant cueing to initiate movement  Transfers Overall transfer level: Needs assistance Equipment used: Rolling walker (2 wheeled) Transfers: Sit to/from Stand Sit to Stand: Min guard         General transfer comment: able to stand this date, requiring VC for hand placement on walker, unable to following commands to place both hands on bed, unsafe push off through walker but able to achieve standing   Ambulation/Gait Ambulation/Gait assistance: Min guard Gait Distance (Feet): 25 Feet(25x2) Assistive device: Rolling walker (2 wheeled)       General Gait Details: pt with good effort able to tolerate two bouts of 25 foot ambulation, significant use of UE to offload R LE due to pain, decreased step length, cueing required to keep walker on ground, pt having difficulty following safety instruction   Stairs             Wheelchair Mobility    Modified Rankin (Stroke Patients Only)       Balance Overall balance assessment: Mild deficits observed, not formally tested   Sitting balance-Leahy Scale: Fair       Standing balance-Leahy Scale: Poor                              Cognition Arousal/Alertness: Awake/alert Behavior During Therapy: Restless;Agitated;Impulsive Overall Cognitive Status: Impaired/Different from baseline Area of Impairment: Orientation;Attention;Following commands;Safety/judgement;Awareness                 Orientation Level: Disoriented  to;Time;Situation     Following Commands: Follows one step commands with increased time;Follows one step commands inconsistently;Follows multi-step commands inconsistently;Follows multi-step commands with increased time Safety/Judgement: Decreased awareness of safety     General Comments: Confused during session, requiring time to answer questions, becoming verbally agitated about his treatment, impulsive laughing        Exercises      General Comments        Pertinent Vitals/Pain Pain Assessment: 0-10 Pain Score: 9 (Pt reports 9/10 but then states his pain is "not that bad otherwise he would need to reposition") Pain Location: R foot and lower leg Pain Intervention(s): Monitored during session;Limited activity within patient's tolerance    Home Living                      Prior Function            PT Goals (current goals can now be found in the care plan section) Progress towards PT goals: Progressing toward goals    Frequency    Min 2X/week      PT Plan Current plan remains appropriate    Co-evaluation              AM-PAC PT "6 Clicks" Daily Activity  Outcome Measure  Difficulty turning over in bed (including adjusting bedclothes, sheets and blankets)?: A Little Difficulty moving from lying on back to sitting on the side of the bed? : A Little Difficulty sitting down on and standing up from a chair with arms (e.g., wheelchair, bedside commode, etc,.)?: Unable Help needed moving to and from a bed to chair (including a wheelchair)?: A Little Help needed walking in hospital room?: A Little Help needed climbing 3-5 steps with a railing? : Total 6 Click Score: 14    End of Session Equipment Utilized During Treatment: Gait belt Activity Tolerance: Patient limited by fatigue;Patient limited by pain Patient left: with call bell/phone within reach;with bed alarm set;in bed Nurse Communication: Mobility status(pain status) PT Visit Diagnosis: Muscle weakness (generalized) (M62.81);Difficulty in walking, not elsewhere classified (R26.2);Other abnormalities of gait and mobility (R26.89);Unsteadiness on feet (R26.81)     Time: 1430-1455 PT Time Calculation (min) (ACUTE ONLY): 25 min  Charges:                        Ernie Avena, SPT 01/24/2018, 4:25 PM

## 2018-01-24 NOTE — Discharge Instructions (Signed)
Wet-to-dry dressing on the R heel and fifth toe and nonstick dressing over the other wounds on the right leg

## 2018-01-24 NOTE — Progress Notes (Signed)
Post HD Treatment  Patient did not tolerate treatment well. During treatment patient experienced cramping and his blood pressure dropped throughout treatment. Interventions performed per Dr. Juleen China who was made aware. Patient did not meet his UF goal due to not being able to tolerate treatment. Per Dr. Juleen China, ok to turn UF off and BFR down.Pt did require a 100 cc bolus during treatment and MD is aware.  UF remained off most of treatment. Patient's Kt was 34.8. His BVP was 58.9 and his Net UF was 50.     01/24/18 1230  Vital Signs  Temp 97.9 F (36.6 C)  Temp Source Oral  Pulse Rate 99  Pulse Rate Source Monitor  Resp 17  BP (!) 159/88  BP Location Right Arm  BP Method Automatic  Patient Position (if appropriate) Lying  Oxygen Therapy  SpO2 100 %  O2 Device Room Air  Pain Assessment  Pain Scale 0-10  Pain Score 0  Dialysis Weight  Weight 62.6 kg  Type of Weight Post-Dialysis  Post-Hemodialysis Assessment  Rinseback Volume (mL) 250 mL  Dialyzer Clearance Lightly streaked  Duration of HD Treatment -hour(s) 3 hour(s)  Hemodialysis Intake (mL) 600 mL  UF Total -Machine (mL) 650 mL  Net UF (mL) 50 mL  Tolerated HD Treatment No (Comment)  AVG/AVF Arterial Site Held (minutes) 10 minutes  AVG/AVF Venous Site Held (minutes) 10 minutes  Fistula / Graft Left Upper arm  No Placement Date or Time found.   Orientation: Left  Access Location: Upper arm  Site Condition No complications  Fistula / Graft Assessment Present;Thrill;Bruit  Status Deaccessed  Drainage Description None

## 2018-01-24 NOTE — Progress Notes (Signed)
PT Cancellation Note  Patient Details Name: Johnny [name]. MRN: [id number] DOB: [birth year]   Cancelled Treatment:    Reason Eval/Treat Not Completed: Patient at procedure or test/unavailable.  Pt currently off unit at dialysis.  Will re-attempt PT treatment session at a later date/time.  Leitha Bleak, PT 01/24/18, 9:37 AM [phone]

## 2018-01-24 NOTE — Progress Notes (Signed)
Post HD Assessment    01/24/18 1240  Neurological  Level of Consciousness Alert  Orientation Level Oriented to person;Oriented to place;Oriented to situation;Disoriented to time  Cardiac  Pulse Regular  Heart Sounds S1, S2  Jugular Venous Distention (JVD) No  ECG Monitor Yes  Cardiac Rhythm NSR  Ectopy Unifocal PVC's  Ectopy Frequency Frequent  Antiarrhythmic device No  Vascular  R Radial Pulse +1  L Radial Pulse +1  Edema Generalized  Generalized Edema +1  Integumentary  Integumentary (WDL) X  Skin Color Appropriate for ethnicity  Skin Condition Dry;Flaky  Musculoskeletal  Musculoskeletal (WDL) X  Generalized Weakness Yes  GU Assessment  Genitourinary (WDL) X (HD pt)  Psychosocial  Psychosocial (WDL) WDL

## 2018-01-24 NOTE — Progress Notes (Signed)
[facility]  ROUNDING NOTE   Subjective:   Confused. Unable to answer questions.   Seen and examined on hemodialysis. Hypotensive. Unable to tolerate ultrafiltration.     HEMODIALYSIS FLOWSHEET:  Blood Flow Rate (mL/min): 350 mL/min Arterial Pressure (mmHg): -160 mmHg Venous Pressure (mmHg): 190 mmHg Transmembrane Pressure (mmHg): 40 mmHg Ultrafiltration Rate (mL/min): (UF off at 463) Dialysate Flow Rate (mL/min): 600 ml/min Conductivity: Machine : 13.9 Conductivity: Machine : 13.9 Dialysis Fluid Bolus: Normal Saline Bolus Amount (mL): 100 mL    Objective:  Vital signs in last 24 hours:  Temp:  [97.7 F (36.5 C)-98.8 F (37.1 C)] 97.8 F (36.6 C) (09/18 0845) Pulse Rate:  [84-116] 89 (09/18 1100) Resp:  [12-31] 13 (09/18 1100) BP: (87-170)/(64-88) 132/77 (09/18 1100) SpO2:  [100 %] 100 % (09/18 1100) Weight:  [63.1 kg] 63.1 kg (09/18 0845)  Weight change:  Filed Weights   01/22/18 1525 01/22/18 1839 01/24/18 0845  Weight: 64.4 kg 63.7 kg 63.1 kg    Intake/Output: I/O last 3 completed shifts: In: 40 [P.O.:40] Out: 1 [Stool:1]   Intake/Output this shift:  No intake/output data recorded.  Physical Exam: General: NAD, laying in bed  Head: Normocephalic, atraumatic. Moist oral mucosal membranes  Eyes: Anicteric, PERRL  Neck: Supple, trachea midline  Lungs:  Clear to auscultation  Heart: Regular rate and rhythm  Abdomen:  Soft, nontender  Extremities: In clean and dry dressings  Neurologic: Nonfocal, moving all four extremities  Skin: No lesions  Access: Left AVG    Basic Metabolic Panel: Recent Labs  Lab 01/20/18 0925 01/21/18 0556 01/23/18 0449  NA 140 143 141  K 3.0* 3.2* 3.5  CL 98 102 99  CO2 29 30 29   GLUCOSE 90 89 84  BUN 17 23 21   CREATININE 5.66* 7.29* 6.27*  CALCIUM 10.3 10.0 9.6    Liver Function Tests: Recent Labs  Lab 01/20/18 0925  AST 26  ALT 8  ALKPHOS 55  BILITOT 0.6  PROT 7.9  ALBUMIN 3.7   No  results for input(s): LIPASE, AMYLASE in the last 168 hours. No results for input(s): AMMONIA in the last 168 hours.  CBC: Recent Labs  Lab 01/20/18 0731 01/21/18 0556  WBC 11.5* 9.1  HGB 11.7* 11.3*  HCT 36.4* 33.9*  MCV 97.0 95.8  PLT 205 210    Cardiac Enzymes: Recent Labs  Lab 01/20/18 0925 01/20/18 1539 01/20/18 2128  TROPONINI 0.17* 0.17* 0.17*    BNP: Invalid input(s): POCBNP  CBG: Recent Labs  Lab 01/20/18 1502 01/21/18 1609  GLUCAP 68* 22    Microbiology: Results for orders placed or performed during the hospital encounter of 01/20/18  MRSA PCR Screening     Status: None   Collection Time: 01/20/18  1:07 PM  Result Value Ref Range Status   MRSA by PCR NEGATIVE NEGATIVE Final    Comment:        The GeneXpert MRSA Assay (FDA approved for NASAL specimens only), is one component of a comprehensive MRSA colonization surveillance program. It is not intended to diagnose MRSA infection nor to guide or monitor treatment for MRSA infections. Performed at [facility], [address] [place], [place] [address]     Coagulation Studies: No results for input(s): LABPROT, INR in the last 72 hours.  Urinalysis: No results for input(s): COLORURINE, LABSPEC, PHURINE, GLUCOSEU, HGBUR, BILIRUBINUR, KETONESUR, PROTEINUR, UROBILINOGEN, NITRITE, LEUKOCYTESUR in the last 72 hours.  Invalid input(s): APPERANCEUR    Imaging: No results found.  Medications:    . allopurinol  100 mg Oral Daily  . amLODipine  10 mg Oral Daily  . aspirin EC  81 mg Oral Daily  . atorvastatin  10 mg Oral q1800  . calcium carbonate  200 mg of elemental calcium Oral TID  . clopidogrel  75 mg Oral Daily  . famotidine  20 mg Oral Daily  . feeding supplement (NEPRO CARB STEADY)  237 mL Oral BID BM  . furosemide  40 mg Oral Once per day on Sun Tue Thu Sat  . heparin  5,000 Units Subcutaneous Q8H  . hydrALAZINE  25 mg Oral Daily  . irbesartan  300 mg Oral Daily  .  multivitamin  1 tablet Oral QHS  . multivitamin-lutein  1 capsule Oral Daily   acetaminophen **OR** acetaminophen, clonazePAM, labetalol, morphine injection, ondansetron **OR** ondansetron (ZOFRAN) IV, oxyCODONE-acetaminophen, polyethylene glycol  Assessment/ Plan:  [name] is a 67 y.o. black male with end stage renal disease on hemodialysis, congestive heart failure, hypertension, peripheral vascular disease  [place]. MWF L AVF  65kg  1. End Stage Renal Disease: seen and examined on hemodialysis. Hypotensive, unable to reach ultrafiltration.   2. Hypertension: hypotensive on treatment.  - amlodipine, fursoemide, hydralazine, irbesartan on nondialysis days. May consider discontinuing an agent if blood pressure is at goal.   3. Anemia of chronic kidney disease: hemoglobin 11.3 - no indication for ESA with ischemia  4. Secondary Hyperparathyroidism: labs from 9/9 PTH 291, calcium 9.8, phosphorus 5.2 - calcium carbonate with meals.   5. Peripheral vascular disease:  - Appreciate vascular and podiatry input    LOS: 0 Johnny [name] 9/18/201911:31 AM

## 2018-01-24 NOTE — Progress Notes (Signed)
Discharge order received. Patient is alert and oriented. Vital signs stable . No signs of acute distress. Discharge instructions given. Patient verbalized understanding. No other issues noted at this time.   

## 2018-01-24 NOTE — Progress Notes (Signed)
HD Treatment Complete    01/24/18 1228  Vital Signs  Pulse Rate 100  Pulse Rate Source Monitor  Resp (!) 21  BP (!) 152/79  BP Location Right Arm  BP Method Automatic  Patient Position (if appropriate) Lying  Oxygen Therapy  SpO2 100 %  O2 Device Room Air  During Hemodialysis Assessment  Intra-Hemodialysis Comments See progress note;Tx completed

## 2018-01-24 NOTE — Progress Notes (Signed)
Pre HD Treatment    01/24/18 0845  Vital Signs  Temp 97.8 F (36.6 C)  Temp Source Oral  Pulse Rate 91  Pulse Rate Source Monitor  Resp 16  BP (!) 169/85  BP Location Right Arm  BP Method Automatic  Patient Position (if appropriate) Lying  Oxygen Therapy  SpO2 100 %  O2 Device Room Air  Pain Assessment  Pain Scale 0-10  Pain Score 0  POSS Scale ([place] Opioid Sedation Scale)  POSS *See Group Information* 1-Acceptable,Awake and alert  Dialysis Weight  Weight 63.1 kg  Type of Weight Pre-Dialysis  Time-Out for Hemodialysis  What Procedure? HD  Pt Identifiers(min of two) First/Last Name;MRN/Account#;Pt's DOB(use if MRN/Acct# not available  Correct Site? Yes  Correct Side? Yes  Correct Procedure? Yes  Consents Verified? Yes  Rad Studies Available? N/A  Safety Precautions Reviewed? Yes  Engineer, civil (consulting) Number [phone] (1A)  Station Number 2  UF/Alarm Test Passed  Conductivity: Meter 14  Conductivity: Machine  13.9  pH 7.6  Reverse Osmosis Main  Normal Saline Lot Number [id number]  Dialyzer Lot Number [id number]  Disposable Set Lot Number [id number]  Machine Temperature 98.6 F (37 C)  Musician and Audible Yes  Blood Lines Intact and Secured Yes  Pre Treatment Patient Checks  Vascular access used during treatment Graft  Hepatitis B Surface Antigen Results Negative  Date Hepatitis B Surface Antigen Drawn 04/26/17  Isolation Initiated Yes  Hepatitis B Surface Antibody  (37)  Date Hepatitis B Surface Antibody Drawn 04/26/17  Hemodialysis Consent Verified Yes  Hemodialysis Standing Orders Initiated Yes  ECG (Telemetry) Monitor On Yes  Prime Ordered Normal Saline  Length of  DialysisTreatment -hour(s) 3 Hour(s)  Dialyzer Elisio 17H NR  Dialysate 3K, 2.5 Ca  Dialysis Anticoagulant None  Dialysate Flow Ordered 600  Blood Flow Rate Ordered 1.5 mL/min  Pre Treatment Labs Other (Comment)  Dialysis Blood Pressure Support Ordered Normal Saline  Education /  Care Plan  Dialysis Education Provided Yes  Documented Education in Care Plan Yes  Fistula / Graft Left Upper arm  No Placement Date or Time found.   Orientation: Left  Access Location: Upper arm  Site Condition No complications  Fistula / Graft Assessment Present;Thrill;Bruit  Drainage Description None

## 2018-01-24 NOTE — Progress Notes (Signed)
HD Treatment Initiated    01/24/18 0908  Vital Signs  Pulse Rate 89  Resp 12  Oxygen Therapy  SpO2 100 %  During Hemodialysis Assessment  Blood Flow Rate (mL/min) 400 mL/min  Arterial Pressure (mmHg) -110 mmHg  Venous Pressure (mmHg) 190 mmHg  Transmembrane Pressure (mmHg) 50 mmHg  Ultrafiltration Rate (mL/min) 670 mL/min  Dialysate Flow Rate (mL/min) 600 ml/min  Conductivity: Machine  13.9  HD Safety Checks Performed Yes  Dialysis Fluid Bolus Normal Saline  Bolus Amount (mL) 250 mL  Intra-Hemodialysis Comments Tx initiated  Fistula / Graft Left Upper arm  No Placement Date or Time found.   Orientation: Left  Access Location: Upper arm  Status Accessed  Needle Size 15

## 2018-01-29 ENCOUNTER — Emergency Department: Payer: Medicare Other

## 2018-01-29 ENCOUNTER — Encounter: Payer: Self-pay | Admitting: Emergency Medicine

## 2018-01-29 ENCOUNTER — Observation Stay
Admission: EM | Admit: 2018-01-29 | Discharge: 2018-02-01 | Disposition: A | Discharge status: Skilled Nursing Facility | Payer: Medicare Other | Attending: Specialist | Admitting: Specialist

## 2018-01-29 ENCOUNTER — Other Ambulatory Visit: Payer: Self-pay

## 2018-01-29 DIAGNOSIS — R079 Chest pain, unspecified: Secondary | ICD-10-CM | POA: Diagnosis present

## 2018-01-29 DIAGNOSIS — D631 Anemia in chronic kidney disease: Secondary | ICD-10-CM | POA: Diagnosis not present

## 2018-01-29 DIAGNOSIS — F419 Anxiety disorder, unspecified: Secondary | ICD-10-CM | POA: Insufficient documentation

## 2018-01-29 DIAGNOSIS — I700 Atherosclerosis of aorta: Secondary | ICD-10-CM | POA: Diagnosis not present

## 2018-01-29 DIAGNOSIS — Z992 Dependence on renal dialysis: Secondary | ICD-10-CM | POA: Diagnosis not present

## 2018-01-29 DIAGNOSIS — M109 Gout, unspecified: Secondary | ICD-10-CM | POA: Insufficient documentation

## 2018-01-29 DIAGNOSIS — I2510 Atherosclerotic heart disease of native coronary artery without angina pectoris: Secondary | ICD-10-CM | POA: Insufficient documentation

## 2018-01-29 DIAGNOSIS — Z66 Do not resuscitate: Secondary | ICD-10-CM | POA: Diagnosis not present

## 2018-01-29 DIAGNOSIS — Z7902 Long term (current) use of antithrombotics/antiplatelets: Secondary | ICD-10-CM | POA: Insufficient documentation

## 2018-01-29 DIAGNOSIS — L97519 Non-pressure chronic ulcer of other part of right foot with unspecified severity: Secondary | ICD-10-CM | POA: Insufficient documentation

## 2018-01-29 DIAGNOSIS — N62 Hypertrophy of breast: Secondary | ICD-10-CM | POA: Diagnosis not present

## 2018-01-29 DIAGNOSIS — Z79899 Other long term (current) drug therapy: Secondary | ICD-10-CM | POA: Diagnosis not present

## 2018-01-29 DIAGNOSIS — I7025 Atherosclerosis of native arteries of other extremities with ulceration: Secondary | ICD-10-CM | POA: Insufficient documentation

## 2018-01-29 DIAGNOSIS — Z7982 Long term (current) use of aspirin: Secondary | ICD-10-CM | POA: Insufficient documentation

## 2018-01-29 DIAGNOSIS — N186 End stage renal disease: Secondary | ICD-10-CM | POA: Insufficient documentation

## 2018-01-29 DIAGNOSIS — I132 Hypertensive heart and chronic kidney disease with heart failure and with stage 5 chronic kidney disease, or end stage renal disease: Secondary | ICD-10-CM | POA: Diagnosis not present

## 2018-01-29 DIAGNOSIS — R0789 Other chest pain: Principal | ICD-10-CM | POA: Insufficient documentation

## 2018-01-29 DIAGNOSIS — Z8249 Family history of ischemic heart disease and other diseases of the circulatory system: Secondary | ICD-10-CM | POA: Diagnosis not present

## 2018-01-29 DIAGNOSIS — R918 Other nonspecific abnormal finding of lung field: Secondary | ICD-10-CM | POA: Diagnosis not present

## 2018-01-29 DIAGNOSIS — E785 Hyperlipidemia, unspecified: Secondary | ICD-10-CM | POA: Insufficient documentation

## 2018-01-29 DIAGNOSIS — Z91013 Allergy to seafood: Secondary | ICD-10-CM | POA: Diagnosis not present

## 2018-01-29 DIAGNOSIS — L97529 Non-pressure chronic ulcer of other part of left foot with unspecified severity: Secondary | ICD-10-CM | POA: Insufficient documentation

## 2018-01-29 DIAGNOSIS — Z87891 Personal history of nicotine dependence: Secondary | ICD-10-CM | POA: Diagnosis not present

## 2018-01-29 LAB — CBC
HCT: 36.9 % — ABNORMAL LOW (ref 40.0–52.0)
Hemoglobin: 12.3 g/dL — ABNORMAL LOW (ref 13.0–18.0)
MCH: 31.5 pg (ref 26.0–34.0)
MCHC: 33.2 g/dL (ref 32.0–36.0)
MCV: 95 fL (ref 80.0–100.0)
PLATELETS: 361 10*3/uL (ref 150–440)
RBC: 3.89 MIL/uL — AB (ref 4.40–5.90)
RDW: 15.7 % — ABNORMAL HIGH (ref 11.5–14.5)
WBC: 26 10*3/uL — ABNORMAL HIGH (ref 3.8–10.6)

## 2018-01-29 LAB — COMPREHENSIVE METABOLIC PANEL
ALT: 10 U/L (ref 0–44)
ANION GAP: 18 — AB (ref 5–15)
AST: 40 U/L (ref 15–41)
Albumin: 3.5 g/dL (ref 3.5–5.0)
Alkaline Phosphatase: 76 U/L (ref 38–126)
BILIRUBIN TOTAL: 0.9 mg/dL (ref 0.3–1.2)
BUN: 34 mg/dL — ABNORMAL HIGH (ref 8–23)
CALCIUM: 10 mg/dL (ref 8.9–10.3)
CO2: 25 mmol/L (ref 22–32)
CREATININE: 7.68 mg/dL — AB (ref 0.61–1.24)
Chloride: 94 mmol/L — ABNORMAL LOW (ref 98–111)
GFR calc non Af Amer: 6 mL/min — ABNORMAL LOW (ref >60)
GFR, EST AFRICAN AMERICAN: 7 mL/min — AB (ref >60)
Glucose, Bld: 99 mg/dL (ref 70–99)
Potassium: 4.4 mmol/L (ref 3.5–5.1)
Sodium: 137 mmol/L (ref 135–145)
TOTAL PROTEIN: 8.2 g/dL — AB (ref 6.5–8.1)

## 2018-01-29 LAB — TROPONIN I
TROPONIN I: 0.07 ng/mL — AB (ref <0.03)
Troponin I: 0.09 ng/mL (ref <0.03)
Troponin I: 0.08 ng/mL (ref <0.03)
Troponin I: 0.07 ng/mL (ref <0.03)

## 2018-01-29 LAB — PHOSPHORUS: PHOSPHORUS: 3.3 mg/dL (ref 2.5–4.6)

## 2018-01-29 MED ORDER — IRBESARTAN 150 MG PO TABS
300.0000 mg | ORAL_TABLET | Freq: Every day | ORAL | Status: DC
  Administered 2018-01-30 – 2018-02-01 (×3): 300 mg via ORAL
  Filled 2018-01-29 (×3): qty 2

## 2018-01-29 MED ORDER — CLOPIDOGREL BISULFATE 75 MG PO TABS
75.0000 mg | ORAL_TABLET | Freq: Every day | ORAL | Status: DC
  Administered 2018-01-30 – 2018-02-01 (×3): 75 mg via ORAL
  Filled 2018-01-29 (×3): qty 1

## 2018-01-29 MED ORDER — CLONAZEPAM 0.125 MG PO TBDP
0.2500 mg | ORAL_TABLET | Freq: Two times a day (BID) | ORAL | Status: DC | PRN
  Administered 2018-01-30: 0.25 mg via ORAL
  Filled 2018-01-29: qty 2

## 2018-01-29 MED ORDER — RENA-VITE PO TABS
1.0000 | ORAL_TABLET | Freq: Every day | ORAL | Status: DC
  Administered 2018-01-29 – 2018-01-31 (×3): 1 via ORAL
  Filled 2018-01-29 (×5): qty 1

## 2018-01-29 MED ORDER — ONDANSETRON HCL 4 MG/2ML IJ SOLN
4.0000 mg | Freq: Four times a day (QID) | INTRAMUSCULAR | Status: DC | PRN
  Administered 2018-01-30: 4 mg via INTRAVENOUS
  Filled 2018-01-29: qty 2

## 2018-01-29 MED ORDER — DIALYVITE 3000 3 MG PO TABS: 1.0000 | ORAL_TABLET | Freq: Every day | ORAL | Status: DC

## 2018-01-29 MED ORDER — FUROSEMIDE 40 MG PO TABS
40.0000 mg | ORAL_TABLET | Freq: Every day | ORAL | Status: DC
  Administered 2018-01-30 – 2018-02-01 (×3): 40 mg via ORAL
  Filled 2018-01-29 (×3): qty 1

## 2018-01-29 MED ORDER — OCUVITE-LUTEIN PO CAPS
1.0000 | ORAL_CAPSULE | Freq: Every day | ORAL | Status: DC
  Administered 2018-01-30 – 2018-02-01 (×3): 1 via ORAL
  Filled 2018-01-29 (×4): qty 1

## 2018-01-29 MED ORDER — COLCHICINE 0.6 MG PO TABS: 0.6000 mg | ORAL_TABLET | Freq: Every day | ORAL | Status: DC | PRN

## 2018-01-29 MED ORDER — POLYETHYLENE GLYCOL 3350 17 G PO PACK: 17.0000 g | PACK | Freq: Every day | ORAL | Status: DC | PRN

## 2018-01-29 MED ORDER — METOPROLOL SUCCINATE ER 25 MG PO TB24
25.0000 mg | ORAL_TABLET | Freq: Every day | ORAL | Status: DC
  Administered 2018-01-29 – 2018-01-31 (×3): 25 mg via ORAL
  Filled 2018-01-29 (×5): qty 1

## 2018-01-29 MED ORDER — NEPRO/CARBSTEADY PO LIQD
237.0000 mL | Freq: Two times a day (BID) | ORAL | Status: DC
  Administered 2018-01-30 – 2018-02-01 (×5): 237 mL via ORAL

## 2018-01-29 MED ORDER — HEPARIN SODIUM (PORCINE) 1000 UNIT/ML DIALYSIS
20.0000 [IU]/kg | INTRAMUSCULAR | Status: DC | PRN
  Filled 2018-01-29: qty 2

## 2018-01-29 MED ORDER — ENOXAPARIN SODIUM 40 MG/0.4ML ~~LOC~~ SOLN: 40.0000 mg | SUBCUTANEOUS | Status: DC

## 2018-01-29 MED ORDER — AMLODIPINE BESYLATE 10 MG PO TABS
10.0000 mg | ORAL_TABLET | Freq: Every day | ORAL | Status: DC
  Administered 2018-01-30 – 2018-02-01 (×3): 10 mg via ORAL
  Filled 2018-01-29 (×4): qty 1

## 2018-01-29 MED ORDER — ATORVASTATIN CALCIUM 10 MG PO TABS
10.0000 mg | ORAL_TABLET | Freq: Every day | ORAL | Status: DC
  Administered 2018-01-30 – 2018-02-01 (×3): 10 mg via ORAL
  Filled 2018-01-29 (×3): qty 1

## 2018-01-29 MED ORDER — CHLORHEXIDINE GLUCONATE CLOTH 2 % EX PADS: 6.0000 | MEDICATED_PAD | Freq: Every day | CUTANEOUS | Status: DC

## 2018-01-29 MED ORDER — ACETAMINOPHEN 325 MG PO TABS
650.0000 mg | ORAL_TABLET | Freq: Four times a day (QID) | ORAL | Status: DC | PRN
  Administered 2018-01-31: 650 mg via ORAL

## 2018-01-29 MED ORDER — CALCIUM CARBONATE ANTACID 500 MG PO CHEW
200.0000 mg | CHEWABLE_TABLET | Freq: Three times a day (TID) | ORAL | Status: DC
  Administered 2018-01-29 – 2018-02-01 (×7): 200 mg via ORAL
  Filled 2018-01-29 (×8): qty 1

## 2018-01-29 MED ORDER — HEPARIN SODIUM (PORCINE) 5000 UNIT/ML IJ SOLN
5000.0000 [IU] | Freq: Three times a day (TID) | INTRAMUSCULAR | Status: DC
  Administered 2018-01-29 – 2018-02-01 (×9): 5000 [IU] via SUBCUTANEOUS
  Filled 2018-01-29 (×8): qty 1

## 2018-01-29 MED ORDER — ASPIRIN EC 81 MG PO TBEC
81.0000 mg | DELAYED_RELEASE_TABLET | Freq: Every day | ORAL | Status: DC
  Administered 2018-01-30 – 2018-02-01 (×3): 81 mg via ORAL
  Filled 2018-01-29 (×3): qty 1

## 2018-01-29 MED ORDER — HYDRALAZINE HCL 25 MG PO TABS
25.0000 mg | ORAL_TABLET | Freq: Every day | ORAL | Status: DC
  Administered 2018-01-30 – 2018-02-01 (×3): 25 mg via ORAL
  Filled 2018-01-29 (×3): qty 1

## 2018-01-29 MED ORDER — ALLOPURINOL 100 MG PO TABS
100.0000 mg | ORAL_TABLET | Freq: Every day | ORAL | Status: DC
  Administered 2018-01-30 – 2018-02-01 (×3): 100 mg via ORAL
  Filled 2018-01-29 (×4): qty 1

## 2018-01-29 MED ORDER — IOHEXOL 350 MG/ML SOLN
75.0000 mL | Freq: Once | INTRAVENOUS | Status: AC | PRN
  Administered 2018-01-29: 75 mL via INTRAVENOUS

## 2018-01-29 NOTE — Progress Notes (Signed)
Post HD Assessment    01/29/18 2105  Neurological  Level of Consciousness Alert  Orientation Level Oriented to person;Oriented to place;Oriented to situation;Disoriented to time  Respiratory  Respiratory Pattern Regular;Unlabored;Symmetrical  Chest Assessment Chest expansion symmetrical  Bilateral Breath Sounds Clear  Cardiac  Pulse Regular  Heart Sounds S1, S2  Jugular Venous Distention (JVD) No  ECG Monitor Yes  Cardiac Rhythm ST  Ectopy Multifocal PVC's  Ectopy Frequency Occasional  Antiarrhythmic device No  Vascular  R Radial Pulse +1  L Radial Pulse +1  Edema Generalized  Generalized Edema +1  Integumentary  Integumentary (WDL) X  Skin Condition Dry;Flaky  Musculoskeletal  Musculoskeletal (WDL) X  Generalized Weakness Yes  GU Assessment  Genitourinary (WDL) X (HD pt)  Psychosocial  Psychosocial (WDL) X  Patient Behaviors Flat affect

## 2018-01-29 NOTE — Progress Notes (Signed)
PHARMACIST - PHYSICIAN COMMUNICATION  CONCERNING:  Enoxaparin (Lovenox) for DVT Prophylaxis   RECOMMENDATION: Patient was prescribed enoxaprin 40mg  q24 hours for VTE prophylaxis.  Patient has PMH of ESRD on HD.   Estimated Creatinine Clearance: 8.4 mL/min (A) (by C-G formula based on SCr of 7.68 mg/dL (H)).  Enoxaparin has not been FDA approved for use in dialysis patients  DESCRIPTION: Pharmacy has discontinued the order for enoxaparin and entered order for heparin 5000units SQ every 8 hours per [place], PharmD, BCPS Clinical Pharmacist 01/29/2018 2:45 PM

## 2018-01-29 NOTE — ED Triage Notes (Signed)
Began chest pain at end of dialysis run. Continues chest pain 8/10.

## 2018-01-29 NOTE — ED Notes (Signed)
Has had attempt x 1 IV by EMS and x 2 by our staff. Refuses further attempts, IV consult put in. Patient will decide if they will allow them to attempt.

## 2018-01-29 NOTE — Progress Notes (Signed)
Several stasis ulcers present on admission.  4cm x 1.5 cm left lateral foot 2 cm round right medial ankle 10cm xc 10cm healing state right lower leg Right pinky toes is black and toe nail is black Left 3rd and 4th toes have approx 1.5cm round wounds Left posterior calf 3cm x 1.5cm

## 2018-01-29 NOTE — Progress Notes (Signed)
HD Treatment Complete    01/29/18 2059  Vital Signs  Pulse Rate (!) 102  Pulse Rate Source Monitor  Resp (!) 24  BP (!) 160/78  BP Location Right Arm  BP Method Automatic  Patient Position (if appropriate) Lying  Oxygen Therapy  SpO2 100 %  O2 Device Room Air  During Hemodialysis Assessment  Blood Flow Rate (mL/min) 400 mL/min  Arterial Pressure (mmHg) -170 mmHg  Venous Pressure (mmHg) 170 mmHg  Transmembrane Pressure (mmHg) 50 mmHg  Ultrafiltration Rate (mL/min) 200 mL/min  Dialysate Flow Rate (mL/min) 800 ml/min  Conductivity: Machine  13.9  HD Safety Checks Performed Yes  Intra-Hemodialysis Comments Tolerated well;Tx completed (UF 523)  Fistula / Graft Left Upper arm  No Placement Date or Time found.   Orientation: Left  Access Location: Upper arm  Status Deaccessed

## 2018-01-29 NOTE — Progress Notes (Signed)
Post HD Treatment  Pt tolerated HD treatment well. His net UF was -23 and his BVP was 57.2. His KECN was 250 and his Kt is 38.6. Report called and given to [facility], RN.     01/29/18 2100  Hand-Off documentation  Report given to (Full Name) Georgian Co, RN  Report received from (Full Name) Stephannie Peters, RN  Vital Signs  Temp 98.7 F (37.1 C)  Temp Source Oral  Pulse Rate (!) 102  Pulse Rate Source Monitor  Resp (!) 22  BP (!) 141/83  BP Location Right Arm  BP Method Automatic  Patient Position (if appropriate) Lying  Oxygen Therapy  SpO2 100 %  O2 Device Room Air  Pain Assessment  Pain Scale 0-10  Pain Score 0  Dialysis Weight  Weight 61.2 kg  Type of Weight Post-Dialysis  Post-Hemodialysis Assessment  Rinseback Volume (mL) 250 mL  KECN 250 V  Dialyzer Clearance Lightly streaked  Duration of HD Treatment -hour(s) 2.5 hour(s)  Hemodialysis Intake (mL) 523 mL  UF Total -Machine (mL) 500 mL  Net UF (mL) -23 mL  Tolerated HD Treatment Yes  AVG/AVF Arterial Site Held (minutes) 10 minutes  AVG/AVF Venous Site Held (minutes) 10 minutes  Fistula / Graft Left Upper arm  No Placement Date or Time found.   Orientation: Left  Access Location: Upper arm  Site Condition No complications  Fistula / Graft Assessment Present;Thrill;Bruit  Drainage Description None

## 2018-01-29 NOTE — Progress Notes (Signed)
Unable to complete admission profile. Pt is unable to answer questions related to admission. I will continue to assess.

## 2018-01-29 NOTE — Progress Notes (Signed)
[facility], [place] 01/29/18  Subjective:   Patient sent from HD unit for chest pain At present it is resolved Work up so far shows CT chest is negative Cardiology evaluation is undergoing  Objective:  Vital signs in last 24 hours:  Temp:  [97.8 F (36.6 C)-98.3 F (36.8 C)] 98.3 F (36.8 C) (09/23 1315) Pulse Rate:  [92-98] 97 (09/23 1315) Resp:  [18] 18 (09/23 1315) BP: (162-168)/(82-93) 168/93 (09/23 1315) SpO2:  [100 %] 100 % (09/23 1315) Weight:  [65.8 kg] 65.8 kg (09/23 0741)  Weight change:  Filed Weights   01/29/18 0741  Weight: 65.8 kg    Intake/Output:   No intake or output data in the 24 hours ending 01/29/18 1450   Physical Exam: General: NAD  HEENT Anicteric, moist oral mucus membranes  Neck supple  Pulm/lungs Clear b/l  CVS/Heart No rub  Abdomen:  Soft, NT  Extremities: No edema  Neurologic: Alert, follows commands  Skin: No acute rashes  Access: Left arm AVG       Basic Metabolic Panel:  Recent Labs  Lab 01/23/18 0449 01/29/18 0746  NA 141 137  K 3.5 4.4  CL 99 94*  CO2 29 25  GLUCOSE 84 99  BUN 21 34*  CREATININE 6.27* 7.68*  CALCIUM 9.6 10.0     CBC: Recent Labs  Lab 01/29/18 0746  WBC 26.0*  HGB 12.3*  HCT 36.9*  MCV 95.0  PLT 361      Lab Results  Component Value Date   HEPBSAG Negative 09/30/2015      Microbiology:  Recent Results (from the past 240 hour(s))  MRSA PCR Screening     Status: None   Collection Time: 01/20/18  1:07 PM  Result Value Ref Range Status   MRSA by PCR NEGATIVE NEGATIVE Final    Comment:        The GeneXpert MRSA Assay (FDA approved for NASAL specimens only), is one component of a comprehensive MRSA colonization surveillance program. It is not intended to diagnose MRSA infection nor to guide or monitor treatment for MRSA infections. Performed at [facility], [address] [place], [place] [address]     Coagulation Studies: No  results for input(s): LABPROT, INR in the last 72 hours.  Urinalysis: No results for input(s): COLORURINE, LABSPEC, PHURINE, GLUCOSEU, HGBUR, BILIRUBINUR, KETONESUR, PROTEINUR, UROBILINOGEN, NITRITE, LEUKOCYTESUR in the last 72 hours.  Invalid input(s): APPERANCEUR    Imaging: Ct Angio Chest Pe W And/or Wo Contrast  Result Date: 01/29/2018 CLINICAL DATA:  Chest pain and shortness of breath. Renal failure patient receiving dialysis EXAM: CT ANGIOGRAPHY CHEST WITH CONTRAST TECHNIQUE: Multidetector CT imaging of the chest was performed using the standard protocol during bolus administration of intravenous contrast. Multiplanar CT image reconstructions and MIPs were obtained to evaluate the vascular anatomy. CONTRAST:  59mL OMNIPAQUE IOHEXOL 350 MG/ML SOLN COMPARISON:  Chest CT Sep 28, 2015; chest radiograph January 29, 2018 FINDINGS: Cardiovascular: There is no demonstrable pulmonary embolus. There is no thoracic aortic aneurysm or dissection. There are scattered foci of calcification throughout the visualized great vessels with several areas of apparent hemodynamically significant obstruction in both subclavian arteries. There is aortic atherosclerosis as well as foci of coronary artery calcification. There is left ventricular hypertrophy. There is no pericardial effusion or pericardial thickening evident. Mediastinum/Nodes: Visualized thyroid appears unremarkable. There is no appreciable thoracic adenopathy. No esophageal lesions are evident. Lungs/Pleura: There is relatively mild bibasilar atelectatic change. There is no parenchymal lung  edema or consolidation. There is a 2 mm nodular opacity abutting the pleura in the anterior segment right upper lobe seen on axial slice 43 series 6, stable from prior study. No appreciable pleural effusion or pleural thickening evident. Upper Abdomen: There is a 1.2 x 1.2 cm cyst arising from the upper pole of the right kidney somewhat eccentrically. There is aortic  and visualized great vessel atherosclerosis. Visualized upper abdominal structures appear unremarkable. Musculoskeletal: There are syndesmophytes throughout much of the thoracic region. No blastic or lytic bone lesions are evident. There is gynecomastia bilaterally. Review of the MIP images confirms the above findings. IMPRESSION: 1. No demonstrable pulmonary embolus. No thoracic aortic aneurysm or dissection. There is aortic atherosclerosis. There are foci of coronary artery calcification. There is extensive great vessel atherosclerotic calcification with areas of hemodynamically significant obstruction in each mid subclavian artery. 2.  Left ventricular hypertrophy. 3. Areas of atelectatic change. No lung edema or consolidation evident. 2 mm nodular opacity right upper lobe, unchanged from 2017 study. 4.  No evident thoracic adenopathy. 5. Apparent syndesmophytes in the thoracic region. Question underlying seronegative spondyloarthropathy. 6.  Gynecomastia bilaterally. Aortic Atherosclerosis (ICD10-I70.0). Electronically Signed   By: Lowella Grip III M.D.   On: 01/29/2018 11:03   Dg Chest Portable 1 View  Result Date: 01/29/2018 CLINICAL DATA:  Chest pain EXAM: PORTABLE CHEST 1 VIEW COMPARISON:  January 20, 2018 FINDINGS: No edema or consolidation. The heart size and pulmonary vascularity are normal. No adenopathy. No bone lesions. There are stents in the left axillary and left brachial regions. There is right axillary and subclavian artery calcification. IMPRESSION: No edema or consolidation.  Stable cardiac silhouette. Electronically Signed   By: Lowella Grip III M.D.   On: 01/29/2018 08:05     Medications:    . allopurinol  100 mg Oral Daily  . amLODipine  10 mg Oral Daily  . aspirin EC  81 mg Oral Daily  . atorvastatin  10 mg Oral Daily  . calcium carbonate  200 mg of elemental calcium Oral TID  . clopidogrel  75 mg Oral Daily  . feeding supplement (NEPRO CARB STEADY)  237 mL Oral  BID BM  . furosemide  40 mg Oral Daily  . heparin injection (subcutaneous)  5,000 Units Subcutaneous Q8H  . hydrALAZINE  25 mg Oral Daily  . irbesartan  300 mg Oral Daily  . metoprolol succinate  25 mg Oral QHS  . multivitamin  1 tablet Oral QHS  . multivitamin-lutein  1 capsule Oral Daily   acetaminophen, clonazepam, colchicine, ondansetron (ZOFRAN) IV, polyethylene glycol  Assessment/ Plan:  67 y.o. male with end stage renal disease on hemodialysis, congestive heart failure, hypertension, peripheral vascular disease  [place]. MWF L AVF  61kg  1.  End-stage renal disease 2.  Anemia of chronic kidney disease 3.  Secondary hyperparathyroidism 4.  Chest pain  Patient got partial dialysis treatment today.  We will do about 2.5-hour treatment today to complete his treatment Work-up for chest pain is in progress.  It appears to have resolved at present Continue home dose of binders with meals We will follow    LOS: 0 Rmani Kellogg Candiss Norse 9/23/20192:50 PM  [place], [place]  Note: This note was prepared with Dragon dictation. Any transcription errors are unintentional

## 2018-01-29 NOTE — Consult Note (Signed)
[facility] Cardiology  CARDIOLOGY CONSULT NOTE  Patient ID: Johnny [name]. MRN: [id number] DOB/AGE: 01/21/1951 67 y.o.  Admit date: 01/29/2018 Referring Physician [place] Primary Physician [place] Primary Cardiologist [facility] Reason for Consultation chest pain  HPI: 67 year old gentleman referred for evaluation of chest pain.  The patient was undergoing hemodialysis earlier today, complained of chest pain, brought to [facility] emergency room.  ECG revealed sinus rhythm, nonspecific intraventricular conduction delay, lateral T wave inversions, which look similar to prior ECG.  Troponin was 0.09.  Other notable admission labs included BUN and creatinine of 34 and 7.68, respectively.  White count was markedly elevated at 26,000.  On questioning, patient denies current chest pain though appears  confused.  2D echocardiogram 11/21/2017 revealed LVEF of 70 to 75%.  Lexiscan Myoview 11/20/2017 revealed LVEF 55 to 65% without evidence for scar or ischemia.  Review of systems complete and found to be negative unless listed above     Past Medical History:  Diagnosis Date  . Anemia   . Anxiety   . CHF (congestive heart failure) ([place])   . Chronic kidney disease   . Gout   . Hyperlipidemia   . Hypertension   . Peripheral vascular disease [facility])     Past Surgical History:  Procedure Laterality Date  . A/V SHUNTOGRAM Left 06/21/2017   Procedure: A/V SHUNTOGRAM;  Surgeon: Katha Cabal, MD;  Location: [place] [facility];  Service: Cardiovascular;  Laterality: Left;  . AV FISTULA PLACEMENT Left 09/18/2015   Procedure: INSERTION OF ARTERIOVENOUS (AV) GORE-TEX GRAFT ARM ( BRACH/AXILLARY GRAFT W/ INSTANT STICK GRAFT );  Surgeon: Katha Cabal, MD;  Location: [facility] ORS;  Service: Vascular;  Laterality: Left;  . DIALYSIS FISTULA CREATION    . ESOPHAGOGASTRODUODENOSCOPY N/A 12/19/2017   Procedure: ESOPHAGOGASTRODUODENOSCOPY (EGD);  Surgeon: Lin Landsman, MD;  Location: [facility] ENDOSCOPY;  Service:  Gastroenterology;  Laterality: N/A;  . LOWER EXTREMITY ANGIOGRAPHY Left 11/16/2017   Procedure: LOWER EXTREMITY ANGIOGRAPHY;  Surgeon: Algernon Huxley, MD;  Location: [place] [facility];  Service: Cardiovascular;  Laterality: Left;  . LOWER EXTREMITY ANGIOGRAPHY Right 01/18/2018   Procedure: LOWER EXTREMITY ANGIOGRAPHY;  Surgeon: Algernon Huxley, MD;  Location: [place] [facility];  Service: Cardiovascular;  Laterality: Right;  . PERIPHERAL VASCULAR CATHETERIZATION Left 09/01/2015   Procedure: A/V Shuntogram/Fistulagram;  Surgeon: Katha Cabal, MD;  Location: [place] [facility];  Service: Cardiovascular;  Laterality: Left;  . PERIPHERAL VASCULAR CATHETERIZATION N/A 09/30/2015   Procedure: A/V Shuntogram/Fistulagram with perm cathether removal;  Surgeon: Algernon Huxley, MD;  Location: [place] [facility];  Service: Cardiovascular;  Laterality: N/A;  . PERIPHERAL VASCULAR CATHETERIZATION Left 09/30/2015   Procedure: A/V Shunt Intervention;  Surgeon: Algernon Huxley, MD;  Location: [place] [facility];  Service: Cardiovascular;  Laterality: Left;  . PERIPHERAL VASCULAR CATHETERIZATION Left 12/03/2015   Procedure: Thrombectomy;  Surgeon: Algernon Huxley, MD;  Location: [place] [facility];  Service: Cardiovascular;  Laterality: Left;  . PERIPHERAL VASCULAR CATHETERIZATION Left 01/28/2016   Procedure: Thrombectomy;  Surgeon: Algernon Huxley, MD;  Location: [place] [facility];  Service: Cardiovascular;  Laterality: Left;  . PERIPHERAL VASCULAR CATHETERIZATION N/A 01/28/2016   Procedure: A/V Shuntogram/Fistulagram;  Surgeon: Algernon Huxley, MD;  Location: [place] [facility];  Service: Cardiovascular;  Laterality: N/A;    Medications Prior to Admission  Medication Sig Dispense Refill Last Dose  . acetaminophen (TYLENOL) 325 MG tablet Take 2 tablets (650 mg total) by mouth every 6 (six)  hours as needed for mild pain (or Fever >/= 101). (Patient not taking: Reported on 01/29/2018)   Not Taking at Unknown time  .  allopurinol (ZYLOPRIM) 100 MG tablet Take 100 mg by mouth daily.   Not Taking at Unknown time  . amLODipine (NORVASC) 10 MG tablet Take 10 mg by mouth daily.    Not Taking at Unknown time  . aspirin EC 81 MG tablet Take 1 tablet (81 mg total) by mouth daily. (Patient not taking: Reported on 01/20/2018) 150 tablet 2 Not Taking at Unknown time  . atorvastatin (LIPITOR) 10 MG tablet Take 1 tablet by mouth daily.   Not Taking at Unknown time  . calcium carbonate (TUMS - DOSED IN MG ELEMENTAL CALCIUM) 500 MG chewable tablet Chew 1 tablet (200 mg of elemental calcium total) by mouth 3 (three) times daily. (Patient not taking: Reported on 01/29/2018) 90 tablet 0 Not Taking at Unknown time  . clonazePAM (KLONOPIN) 0.5 MG tablet Take 0.5 tablets (0.25 mg total) by mouth 2 (two) times daily as needed (insomnia). (Patient not taking: Reported on 01/29/2018) 30 tablet 0 Not Taking at Unknown time  . clopidogrel (PLAVIX) 75 MG tablet Take 1 tablet (75 mg total) by mouth daily. (Patient not taking: Reported on 01/20/2018) 30 tablet 11 Not Taking at Unknown time  . colchicine 0.6 MG tablet Take 0.6 mg by mouth daily as needed (for gout flares).    Not Taking at Unknown time  . folic acid-vitamin b complex-vitamin c-selenium-zinc (DIALYVITE) 3 MG TABS tablet Take by mouth.   Not Taking at Unknown time  . furosemide (LASIX) 40 MG tablet Take 40 mg by mouth daily.    Not Taking at Unknown time  . hydrALAZINE (APRESOLINE) 25 MG tablet Take 25 mg by mouth daily.    Not Taking at Unknown time  . irbesartan (AVAPRO) 300 MG tablet Take 300 mg by mouth daily.    Not Taking at Unknown time  . metoprolol succinate (TOPROL XL) 25 MG 24 hr tablet Take 1 tablet (25 mg total) by mouth at bedtime. (Patient not taking: Reported on 01/29/2018) 30 tablet 0 Not Taking at Unknown time  . multivitamin (RENA-VIT) TABS tablet Take 1 tablet by mouth at bedtime. (Patient not taking: Reported on 01/20/2018) 30 tablet 0 Not Taking at Unknown time  .  multivitamin-lutein (OCUVITE-LUTEIN) CAPS capsule Take 1 capsule by mouth daily. (Patient not taking: Reported on 01/20/2018) 30 capsule 0 Not Taking at Unknown time  . Nutritional Supplements (FEEDING SUPPLEMENT, NEPRO CARB STEADY,) LIQD Take 237 mLs by mouth 2 (two) times daily between meals. (Patient not taking: Reported on 01/20/2018) 30 Can 0 Not Taking at Unknown time  . oxyCODONE-acetaminophen (PERCOCET/ROXICET) 5-325 MG tablet Take 1 tablet by mouth every 6 (six) hours as needed for moderate pain or severe pain. (Patient not taking: Reported on 01/29/2018) 12 tablet 0 Not Taking at Unknown time  . polyethylene glycol (MIRALAX / GLYCOLAX) packet Take 17 g by mouth daily as needed for mild constipation. (Patient not taking: Reported on 01/29/2018) 14 each 0 Not Taking at Unknown time   Social History   Socioeconomic History  . Marital status: Single    Spouse name: Not on file  . Number of children: Not on file  . Years of education: Not on file  . Highest education level: Not on file  Occupational History  . Occupation: retired  Scientific laboratory technician  . Financial resource strain: Patient refused  . Food insecurity:    Worry:  Patient refused    Inability: Patient refused  . Transportation needs:    Medical: Patient refused    Non-medical: Patient refused  Tobacco Use  . Smoking status: Former Research scientist (life sciences)  . Smokeless tobacco: Never Used  Substance and Sexual Activity  . Alcohol use: No  . Drug use: No  . Sexual activity: Not on file  Lifestyle  . Physical activity:    Days per week: Patient refused    Minutes per session: Patient refused  . Stress: Not on file  Relationships  . Social connections:    Talks on phone: Patient refused    Gets together: Patient refused    Attends religious service: Patient refused    Active member of club or organization: Patient refused    Attends meetings of clubs or organizations: Patient refused    Relationship status: Patient refused  . Intimate  partner violence:    Fear of current or ex partner: Patient refused    Emotionally abused: Patient refused    Physically abused: Patient refused    Forced sexual activity: Patient refused  Other Topics Concern  . Not on file  Social History Narrative  . Not on file    Family History  Problem Relation Age of Onset  . Hypertension Unknown   . Heart disease Unknown       Review of systems complete and found to be negative unless listed above      PHYSICAL EXAM  General: Well developed, well nourished, in no acute distress HEENT:  Normocephalic and atramatic Neck:  No JVD.  Lungs: Clear bilaterally to auscultation and percussion. Heart: HRRR . Normal S1 and S2 without gallops or murmurs.  Abdomen: Bowel sounds are positive, abdomen soft and non-tender  Msk:  Back normal, normal gait. Normal strength and tone for age. Extremities: No clubbing, cyanosis or edema.   Neuro: Alert and oriented X 3. Psych:  Good affect, responds appropriately  Labs:   Lab Results  Component Value Date   WBC 26.0 (H) 01/29/2018   HGB 12.3 (L) 01/29/2018   HCT 36.9 (L) 01/29/2018   MCV 95.0 01/29/2018   PLT 361 01/29/2018    Recent Labs  Lab 01/29/18 0746  NA 137  K 4.4  CL 94*  CO2 25  BUN 34*  CREATININE 7.68*  CALCIUM 10.0  PROT 8.2*  BILITOT 0.9  ALKPHOS 76  ALT 10  AST 40  GLUCOSE 99   Lab Results  Component Value Date   CKTOTAL 77 06/20/2011   CKMB 0.9 06/20/2011   TROPONINI 0.09 (HH) 01/29/2018   No results found for: CHOL No results found for: HDL No results found for: LDLCALC No results found for: TRIG No results found for: CHOLHDL No results found for: LDLDIRECT    Radiology: Ct Angio Chest Pe W And/or Wo Contrast  Result Date: 01/29/2018 CLINICAL DATA:  Chest pain and shortness of breath. Renal failure patient receiving dialysis EXAM: CT ANGIOGRAPHY CHEST WITH CONTRAST TECHNIQUE: Multidetector CT imaging of the chest was performed using the standard protocol  during bolus administration of intravenous contrast. Multiplanar CT image reconstructions and MIPs were obtained to evaluate the vascular anatomy. CONTRAST:  72mL OMNIPAQUE IOHEXOL 350 MG/ML SOLN COMPARISON:  Chest CT Sep 28, 2015; chest radiograph January 29, 2018 FINDINGS: Cardiovascular: There is no demonstrable pulmonary embolus. There is no thoracic aortic aneurysm or dissection. There are scattered foci of calcification throughout the visualized great vessels with several areas of apparent hemodynamically significant obstruction in both subclavian  arteries. There is aortic atherosclerosis as well as foci of coronary artery calcification. There is left ventricular hypertrophy. There is no pericardial effusion or pericardial thickening evident. Mediastinum/Nodes: Visualized thyroid appears unremarkable. There is no appreciable thoracic adenopathy. No esophageal lesions are evident. Lungs/Pleura: There is relatively mild bibasilar atelectatic change. There is no parenchymal lung edema or consolidation. There is a 2 mm nodular opacity abutting the pleura in the anterior segment right upper lobe seen on axial slice 43 series 6, stable from prior study. No appreciable pleural effusion or pleural thickening evident. Upper Abdomen: There is a 1.2 x 1.2 cm cyst arising from the upper pole of the right kidney somewhat eccentrically. There is aortic and visualized great vessel atherosclerosis. Visualized upper abdominal structures appear unremarkable. Musculoskeletal: There are syndesmophytes throughout much of the thoracic region. No blastic or lytic bone lesions are evident. There is gynecomastia bilaterally. Review of the MIP images confirms the above findings. IMPRESSION: 1. No demonstrable pulmonary embolus. No thoracic aortic aneurysm or dissection. There is aortic atherosclerosis. There are foci of coronary artery calcification. There is extensive great vessel atherosclerotic calcification with areas of  hemodynamically significant obstruction in each mid subclavian artery. 2.  Left ventricular hypertrophy. 3. Areas of atelectatic change. No lung edema or consolidation evident. 2 mm nodular opacity right upper lobe, unchanged from 2017 study. 4.  No evident thoracic adenopathy. 5. Apparent syndesmophytes in the thoracic region. Question underlying seronegative spondyloarthropathy. 6.  Gynecomastia bilaterally. Aortic Atherosclerosis (ICD10-I70.0). Electronically Signed   By: Lowella Grip III M.D.   On: 01/29/2018 11:03   Mr Brain Wo Contrast  Result Date: 01/20/2018 CLINICAL DATA:  History of hypertension and heart failure. Generalized weakness beginning today. EXAM: MRI HEAD WITHOUT CONTRAST TECHNIQUE: Multiplanar, multiecho pulse sequences of the brain and surrounding structures were obtained without intravenous contrast. COMPARISON:  Head CT 07/09/2009 FINDINGS: Brain: Diffusion imaging does not show any acute or subacute infarction. The brainstem and cerebellum are normal. Cerebral hemispheres show chronic small-vessel ischemic changes of the hemispheric white matter and the basal ganglia and thalami right more than left. 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 No cortical or large vessel territory infarction. No mass lesion, hemorrhage, hydrocephalus or extra-axial collection. Vascular: Major vessels at the base of the brain show flow. Skull and upper cervical spine: Negative Sinuses/Orbits: Clear/normal Other: None IMPRESSION: No acute or subacute finding. Chronic small-vessel ischemic changes of the cerebral hemispheric white matter, basal ganglia and thalami. 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 No evidence of acute hemorrhage. Electronically Signed   By: Nelson Chimes M.D.   On: 01/20/2018 14:42   Dg Chest Portable 1 View  Result Date: 01/29/2018 CLINICAL DATA:  Chest pain EXAM: PORTABLE CHEST 1  VIEW COMPARISON:  January 20, 2018 FINDINGS: No edema or consolidation. The heart size and pulmonary vascularity are normal. No adenopathy. No bone lesions. There are stents in the left axillary and left brachial regions. There is right axillary and subclavian artery calcification. IMPRESSION: No edema or consolidation.  Stable cardiac silhouette. Electronically Signed   By: Lowella Grip III M.D.   On: 01/29/2018 08:05   Dg Chest Portable 1 View  Result Date: 01/20/2018 CLINICAL DATA:  Weakness EXAM: PORTABLE CHEST 1 VIEW COMPARISON:  12/23/2017 FINDINGS: Lungs are essentially clear. Mild bibasilar scarring. No focal consolidation. No pleural effusion or pneumothorax. Heart is top-normal in size. IMPRESSION: No evidence of acute cardiopulmonary disease. Electronically Signed  By: Julian Hy M.D.   On: 01/20/2018 11:11    EKG: Venous rhythm, nonspecific intraventricular conduction delay, lateral T wave inversions  ASSESSMENT AND PLAN:   1.  Chest pain, which occurred during dialysis, currently chest pain-free, borderline elevated troponin, with no normal left ventricular function, recent negative Lexiscan Myoview, abnormal ECG which looks unchanged compared to most recent ECG, in the setting of markedly elevated white count, and probable sepsis 2.  Markedly elevated white count, probable underlying sepsis  Recommendations  1.  Agree with overall current therapy 2.  Continue to cycle troponins 3.  Defer heparin bolus and drip at this time, in the absence of chest pain 4.  Blood cultures 5.  Broad-spectrum antibiotics  Signed: Isaias Cowman MD,PhD, [facility] 01/29/2018, 1:14 PM

## 2018-01-29 NOTE — ED Provider Notes (Signed)
[facility] Emergency Department Provider Note   ____________________________________________    I have reviewed the triage vital signs and the nursing notes.   HISTORY  Chief Complaint Chest Pain     HPI Johnny [name]. is a 67 y.o. male with a history of CHF, end-stage renal disease, peripheral vascular disease who presents today with chest pain.  Patient reports that he went to dialysis today and during dialysis he developed chest pressure in the center of his chest.  They stopped dialysis and sent him to the emergency department.  He reports prior to dialysis he felt well.  Denies shortness of breath.  No fevers or chills reported.  No calf pain or swelling or pleurisy.  Recent admission for arrhythmia and wounds of the feet which are chronic.  Past Medical History:  Diagnosis Date  . Anemia   . Anxiety   . CHF (congestive heart failure) ([place])   . Chronic kidney disease   . Gout   . Hyperlipidemia   . Hypertension   . Peripheral vascular disease [facility])     Patient Active Problem List   Diagnosis Date Noted  . Weakness of right lower extremity 01/20/2018  . Fever   . Periumbilical abdominal pain   . Confusion 12/22/2017  . Acute delirium 12/21/2017  . Protein-calorie malnutrition, severe 12/19/2017  . Intractable nausea and vomiting 12/18/2017  . Lymphedema 12/13/2017  . Cellulitis 11/27/2017  . Chest pain 11/19/2017  . Atherosclerosis of native arteries of the extremities with ulceration ([place]) 11/07/2017  . Elevated troponin 10/02/2015  . Complications, dialysis, catheter, mechanical ([place]) 10/02/2015  . Musculoskeletal chest pain 09/28/2015  . ESRD on dialysis ([place]) 09/28/2015  . HTN (hypertension) 09/28/2015  . Chronic diastolic CHF (congestive heart failure) ([place]) 09/28/2015  . Gout 09/28/2015    Past Surgical History:  Procedure Laterality Date  . A/V SHUNTOGRAM Left 06/21/2017   Procedure: A/V SHUNTOGRAM;  Surgeon:  Katha Cabal, MD;  Location: [place] CV LAB;  Service: Cardiovascular;  Laterality: Left;  . AV FISTULA PLACEMENT Left 09/18/2015   Procedure: INSERTION OF ARTERIOVENOUS (AV) GORE-TEX GRAFT ARM ( BRACH/AXILLARY GRAFT W/ INSTANT STICK GRAFT );  Surgeon: Katha Cabal, MD;  Location: [facility] ORS;  Service: Vascular;  Laterality: Left;  . DIALYSIS FISTULA CREATION    . ESOPHAGOGASTRODUODENOSCOPY N/A 12/19/2017   Procedure: ESOPHAGOGASTRODUODENOSCOPY (EGD);  Surgeon: Lin Landsman, MD;  Location: [facility] END[facility]COPY;  Service: Gastroenterology;  Laterality: N/A;  . LOWER EXTREMITY ANGIOGRAPHY Left 11/16/2017   Procedure: LOWER EXTREMITY ANGIOGRAPHY;  Surgeon: Algernon Huxley, MD;  Location: [place] CV LAB;  Service: Cardiovascular;  Laterality: Left;  . LOWER EXTREMITY ANGIOGRAPHY Right 01/18/2018   Procedure: LOWER EXTREMITY ANGIOGRAPHY;  Surgeon: Algernon Huxley, MD;  Location: [place] CV LAB;  Service: Cardiovascular;  Laterality: Right;  . PERIPHERAL VASCULAR CATHETERIZATION Left 09/01/2015   Procedure: A/V Shuntogram/Fistulagram;  Surgeon: Katha Cabal, MD;  Location: [place] CV LAB;  Service: Cardiovascular;  Laterality: Left;  . PERIPHERAL VASCULAR CATHETERIZATION N/A 09/30/2015   Procedure: A/V Shuntogram/Fistulagram with perm cathether removal;  Surgeon: Algernon Huxley, MD;  Location: [place] CV LAB;  Service: Cardiovascular;  Laterality: N/A;  . PERIPHERAL VASCULAR CATHETERIZATION Left 09/30/2015   Procedure: A/V Shunt Intervention;  Surgeon: Algernon Huxley, MD;  Location: [place] CV LAB;  Service: Cardiovascular;  Laterality: Left;  . PERIPHERAL VASCULAR CATHETERIZATION Left 12/03/2015   Procedure: Thrombectomy;  Surgeon: Algernon Huxley, MD;  Location:  [place] CV LAB;  Service: Cardiovascular;  Laterality: Left;  . PERIPHERAL VASCULAR CATHETERIZATION Left 01/28/2016   Procedure: Thrombectomy;  Surgeon: Algernon Huxley, MD;  Location: [place] CV LAB;   Service: Cardiovascular;  Laterality: Left;  . PERIPHERAL VASCULAR CATHETERIZATION N/A 01/28/2016   Procedure: A/V Shuntogram/Fistulagram;  Surgeon: Algernon Huxley, MD;  Location: [place] CV LAB;  Service: Cardiovascular;  Laterality: N/A;    Prior to Admission medications   Medication Sig Start Date End Date Taking? Authorizing Provider  acetaminophen (TYLENOL) 325 MG tablet Take 2 tablets (650 mg total) by mouth every 6 (six) hours as needed for mild pain (or Fever >/= 101). Patient not taking: Reported on 01/29/2018 01/24/18   Loletha Grayer, MD  allopurinol (ZYLOPRIM) 100 MG tablet Take 100 mg by mouth daily.    [provider]  amLODipine (NORVASC) 10 MG tablet Take 10 mg by mouth daily.     [provider]  aspirin EC 81 MG tablet Take 1 tablet (81 mg total) by mouth daily. Patient not taking: Reported on 01/20/2018 01/18/18   Algernon Huxley, MD  atorvastatin (LIPITOR) 10 MG tablet Take 1 tablet by mouth daily. 11/23/17   [provider]  calcium carbonate (TUMS - DOSED IN MG ELEMENTAL CALCIUM) 500 MG chewable tablet Chew 1 tablet (200 mg of elemental calcium total) by mouth 3 (three) times daily. Patient not taking: Reported on 01/29/2018 01/24/18   Loletha Grayer, MD  clonazePAM (KLONOPIN) 0.5 MG tablet Take 0.5 tablets (0.25 mg total) by mouth 2 (two) times daily as needed (insomnia). Patient not taking: Reported on 01/29/2018 01/24/18   Loletha Grayer, MD  clopidogrel (PLAVIX) 75 MG tablet Take 1 tablet (75 mg total) by mouth daily. Patient not taking: Reported on 01/20/2018 12/03/15   Algernon Huxley, MD  colchicine 0.6 MG tablet Take 0.6 mg by mouth daily as needed (for gout flares).     [provider]  folic acid-vitamin b complex-vitamin c-selenium-zinc (DIALYVITE) 3 MG TABS tablet Take by mouth.    [provider]  furosemide (LASIX) 40 MG tablet Take 40 mg by mouth daily.     [provider]  hydrALAZINE (APRESOLINE) 25 MG tablet  Take 25 mg by mouth daily.     [provider]  irbesartan (AVAPRO) 300 MG tablet Take 300 mg by mouth daily.     [provider]  metoprolol succinate (TOPROL XL) 25 MG 24 hr tablet Take 1 tablet (25 mg total) by mouth at bedtime. Patient not taking: Reported on 01/29/2018 01/24/18   Loletha Grayer, MD  multivitamin (RENA-VIT) TABS tablet Take 1 tablet by mouth at bedtime. Patient not taking: Reported on 01/20/2018 12/25/17   Fritzi Mandes, MD  multivitamin-lutein [facility]) CAPS capsule Take 1 capsule by mouth daily. Patient not taking: Reported on 01/20/2018 12/25/17   Fritzi Mandes, MD  Nutritional Supplements (FEEDING SUPPLEMENT, NEPRO CARB STEADY,) LIQD Take 237 mLs by mouth 2 (two) times daily between meals. Patient not taking: Reported on 01/20/2018 12/25/17   Fritzi Mandes, MD  oxyCODONE-acetaminophen (PERCOCET/ROXICET) 5-325 MG tablet Take 1 tablet by mouth every 6 (six) hours as needed for moderate pain or severe pain. Patient not taking: Reported on 01/29/2018 01/24/18   Loletha Grayer, MD  polyethylene glycol [facility]) packet Take 17 g by mouth daily as needed for mild constipation. Patient not taking: Reported on 01/29/2018 01/24/18   Loletha Grayer, MD     Allergies Shellfish allergy  Family History  Problem Relation Age of Onset  . Hypertension Unknown   . Heart disease Unknown     Social History Social History   Tobacco Use  . Smoking status: Former Research scientist (life sciences)  . Smokeless tobacco: Never Used  Substance Use Topics  . Alcohol use: No  . Drug use: No    Review of Systems  Constitutional: No fever/chills Eyes: No visual changes.  ENT: No sore throat. Cardiovascular: As above Respiratory: Denies shortness of breath. Gastrointestinal: No abdominal pain.  No nausea, no vomiting.   Genitourinary: Negative for dysuria. Musculoskeletal: Negative for back pain. Skin: As above Neurological: Negative for headaches or  weakness   ____________________________________________   PHYSICAL EXAM:  VITAL SIGNS: ED Triage Vitals  Enc Vitals Group     BP 01/29/18 0740 (!) 162/82     Pulse Rate 01/29/18 0740 92     Resp 01/29/18 0740 18     Temp 01/29/18 0740 97.8 F (36.6 C)     Temp Source 01/29/18 0740 Oral     SpO2 01/29/18 0740 100 %     Weight 01/29/18 0741 65.8 kg (145 lb)     Height 01/29/18 0741 1.727 m (5\' 8" )     Head Circumference --      Peak Flow --      Pain Score --      Pain Loc --      Pain Edu? --      Excl. in [place]? --     Constitutional: Alert and oriented. No acute distress. Pleasant and interactive  Nose: No congestion/rhinnorhea. Mouth/Throat: Mucous membranes are moist.   Neck:  Painless ROM Cardiovascular: Normal rate, regular rhythm. Grossly normal heart sounds.  Good peripheral circulation. Respiratory: Normal respiratory effort.  No retractions. Lungs CTAB. Gastrointestinal: Soft and nontender. No distention.   Genitourinary: deferred Musculoskeletal: No lower extremity tenderness nor edema.   Neurologic:  Normal speech and language. No gross focal neurologic deficits are appreciated.  Skin:  Skin is warm, dry and intact. No rash noted. Psychiatric: Mood and affect are normal. Speech and behavior are normal.  ____________________________________________   LABS (all labs ordered are listed, but only abnormal results are displayed)  Labs Reviewed  CBC - Abnormal; Notable for the following components:      Result Value   WBC 26.0 (*)    RBC 3.89 (*)    Hemoglobin 12.3 (*)    HCT 36.9 (*)    RDW 15.7 (*)    All other components within normal limits  TROPONIN I - Abnormal; Notable for the following components:   Troponin I 0.09 (*)    All other components within normal limits  COMPREHENSIVE METABOLIC PANEL - Abnormal; Notable for the following components:   Chloride 94 (*)    BUN 34 (*)    Creatinine, Ser 7.68 (*)    Total Protein 8.2 (*)    GFR calc non  Af Amer 6 (*)    GFR calc Af Amer 7 (*)    Anion gap 18 (*)    All other components within normal limits   ____________________________________________  EKG  ED ECG REPORT I, Lavonia Drafts, the attending physician, personally viewed and interpreted this ECG.  Date: 01/29/2018  Rhythm: normal sinus rhythm QRS Axis: normal Intervals: normal ST/T Wave abnormalities: ST depressions laterally more significant than prior Narrative Interpretation: Concerning deep ST depressions laterally will discuss with cardiology  ____________________________________________  RADIOLOGY  CT angiography negative for PE ____________________________________________   PROCEDURES  Procedure(s) performed:  yes Angiocath insertion Performed by: Lavonia Drafts  Consent: Verbal consent obtained. Risks and benefits: risks, benefits and alternatives were discussed Time out: Immediately prior to procedure a "time out" was called to verify the correct patient, procedure, equipment, support staff and site/side marked as required.  Preparation: Patient was prepped and draped in the usual sterile fashion.  Vein Location: right AC  Ultrasound Guided  Gauge: 20  Normal blood return and flush without difficulty Patient tolerance: Patient tolerated the procedure well with no immediate complications.     Procedures   Critical Care performed:No ____________________________________________   INITIAL IMPRESSION / ASSESSMENT AND PLAN / ED COURSE  Pertinent labs & imaging results that were available during my care of the patient were reviewed by me and considered in my medical decision making (see chart for details).  Patient with known peripheral vascular disease, end-stage renal disease who presents with chest pressure and deeper T wave inversions.  Discussed with Dr. Saralyn Pilar of cardiology, will obtain labs including potassium and troponin and monitor carefully.  Troponin is mildly elevated  however less than what appears to be his baseline.  He does have a significantly elevated white blood cell count, unclear what is causing this.  Will send for CT angiography given his chest pain and elevated white blood cell count and history of vascular disease   ----------------------------------------- 10:15 AM on 01/29/2018 -----------------------------------------  Multiple nurses and IV team unable to obtain access, I placed an ultrasound IV   ----------------------------------------- 11:29 AM on 01/29/2018 -----------------------------------------  No PE on CT angiography.  Given EKG changes noted above elevated white blood cell count and multiple comorbidities will admit to the hospital service for further evaluation of chest pain    ____________________________________________   FINAL CLINICAL IMPRESSION(S) / ED DIAGNOSES  Final diagnoses:  Chest pain, unspecified type        Note:  This document was prepared using Dragon voice recognition software and may include unintentional dictation errors.    Lavonia Drafts, MD 01/29/18 [phone]

## 2018-01-29 NOTE — Progress Notes (Signed)
Pre HD Treatment    01/29/18 1815  Vital Signs  Temp 99 F (37.2 C)  Temp Source Oral  Pulse Rate 87  Pulse Rate Source Monitor  Resp 19  BP (!) 165/82  BP Location Right Arm  BP Method Automatic  Patient Position (if appropriate) Lying  Oxygen Therapy  SpO2 100 %  O2 Device Room Air  Pain Assessment  Pain Scale 0-10  Pain Score 0  Dialysis Weight  Weight 61.7 kg  Type of Weight Pre-Dialysis  Time-Out for Hemodialysis  What Procedure? HD  Pt Identifiers(min of two) First/Last Name;MRN/Account#;Pt's DOB(use if MRN/Acct# not available  Correct Site? Yes  Correct Side? Yes  Correct Procedure? Yes  Consents Verified? Yes  Rad Studies Available? N/A  Safety Precautions Reviewed? Yes  Engineer, civil (consulting) Number [phone]  Station Number 2  UF/Alarm Test Passed  Conductivity: Meter 14  Conductivity: Machine  14  pH 7.6  Reverse Osmosis Main  Normal Saline Lot Number [id number]  Dialyzer Lot Number [id number]  Disposable Set Lot Number [id number]  Machine Temperature 98.6 F (37 C)  Musician and Audible Yes  Blood Lines Intact and Secured Yes  Pre Treatment Patient Checks  Vascular access used during treatment Graft  Hepatitis B Surface Antigen Results Negative  Date Hepatitis B Surface Antigen Drawn 09/30/15  Isolation Initiated Yes  Hepatitis B Surface Antibody  (unknown)  Date Hepatitis B Surface Antibody Drawn  (unknown)  Hemodialysis Consent Verified Yes  Hemodialysis Standing Orders Initiated Yes  ECG (Telemetry) Monitor On Yes  Prime Ordered Normal Saline  Length of  DialysisTreatment -hour(s) 2.5 Hour(s) (2.5 hours)  Dialyzer Elisio 17H NR  Dialysate 3K, 2.5 Ca  Dialysis Anticoagulant Heparin  Dialysate Flow Ordered 800  Blood Flow Rate Ordered 400 mL/min  Ultrafiltration Goal 0 Liters  Pre Treatment Labs Phosphorus;Other (Comment)  Dialysis Blood Pressure Support Ordered Normal Saline  Education / Care Plan  Dialysis Education Provided Yes   Documented Education in Care Plan Yes  Fistula / Graft Left Upper arm  No Placement Date or Time found.   Orientation: Left  Access Location: Upper arm  Site Condition No complications  Fistula / Graft Assessment Present;Thrill;Bruit  Drainage Description None

## 2018-01-29 NOTE — ED Notes (Signed)
IV team has been unsuccessful at IV site. MD notified for advise.

## 2018-01-29 NOTE — Progress Notes (Signed)
Attempted PIV with ultrasound. Per patient request only attempted once.

## 2018-01-29 NOTE — H&P (Signed)
[facility] [place] NAME: Johnny [name]    MR#:  [id number]  DATE OF BIRTH:  1950/08/22  DATE OF ADMISSION:  01/29/2018  PRIMARY CARE PHYSICIAN: [name], MD   REQUESTING/REFERRING PHYSICIAN: Dr. Corky Downs  CHIEF COMPLAINT:   Chest pain HISTORY OF PRESENT ILLNESS:  Johnny [name]  is a 67 y.o. male with a known history of end-stage renal disease, hypertension hyperlipidemia, gout, congestive heart failure, peripheral vascular disease, anxiety and other medical problems is presenting to the ED with a chief complaint of chest pain.  Patient went to his regular dialysis today but unable to complete the dialysis in view of chest pressure in the center of his chest.  Dialysis was discontinued in the middle and he was sent over to the emergency department.  Initial troponin at 0.09 but previous troponin was at 0.17. ED physician was concerned about T wave inversions in the lateral leads and hospitalist team is called to admit the patient.  Patient was  resting okay during my examination  PAST MEDICAL HISTORY:   Past Medical History:  Diagnosis Date  . Anemia   . Anxiety   . CHF (congestive heart failure) ([place])   . Chronic kidney disease   . Gout   . Hyperlipidemia   . Hypertension   . Peripheral vascular disease ([place])     PAST SURGICAL HISTOIRY:   Past Surgical History:  Procedure Laterality Date  . A/V SHUNTOGRAM Left 06/21/2017   Procedure: A/V SHUNTOGRAM;  Surgeon: Katha Cabal, MD;  Location: [facility];  Service: Cardiovascular;  Laterality: Left;  . AV FISTULA PLACEMENT Left 09/18/2015   Procedure: INSERTION OF ARTERIOVENOUS (AV) GORE-TEX GRAFT ARM ( BRACH/AXILLARY GRAFT W/ INSTANT STICK GRAFT );  Surgeon: Katha Cabal, MD;  Location: [facility];  Service: Vascular;  Laterality: Left;  . DIALYSIS FISTULA CREATION    . ESOPHAGOGASTRODUODENOSCOPY N/A 12/19/2017   Procedure: ESOPHAGOGASTRODUODENOSCOPY  (EGD);  Surgeon: Lin Landsman, MD;  Location: [facility];  Service: Gastroenterology;  Laterality: N/A;  . LOWER EXTREMITY ANGIOGRAPHY Left 11/16/2017   Procedure: LOWER EXTREMITY ANGIOGRAPHY;  Surgeon: Algernon Huxley, MD;  Location: [facility];  Service: Cardiovascular;  Laterality: Left;  . LOWER EXTREMITY ANGIOGRAPHY Right 01/18/2018   Procedure: LOWER EXTREMITY ANGIOGRAPHY;  Surgeon: Algernon Huxley, MD;  Location: [facility];  Service: Cardiovascular;  Laterality: Right;  . PERIPHERAL VASCULAR CATHETERIZATION Left 09/01/2015   Procedure: A/V Shuntogram/Fistulagram;  Surgeon: Katha Cabal, MD;  Location: [facility];  Service: Cardiovascular;  Laterality: Left;  . PERIPHERAL VASCULAR CATHETERIZATION N/A 09/30/2015   Procedure: A/V Shuntogram/Fistulagram with perm cathether removal;  Surgeon: Algernon Huxley, MD;  Location: [facility];  Service: Cardiovascular;  Laterality: N/A;  . PERIPHERAL VASCULAR CATHETERIZATION Left 09/30/2015   Procedure: A/V Shunt Intervention;  Surgeon: Algernon Huxley, MD;  Location: [facility];  Service: Cardiovascular;  Laterality: Left;  . PERIPHERAL VASCULAR CATHETERIZATION Left 12/03/2015   Procedure: Thrombectomy;  Surgeon: Algernon Huxley, MD;  Location: [facility];  Service: Cardiovascular;  Laterality: Left;  . PERIPHERAL VASCULAR CATHETERIZATION Left 01/28/2016   Procedure: Thrombectomy;  Surgeon: Algernon Huxley, MD;  Location: [facility];  Service: Cardiovascular;  Laterality: Left;  . PERIPHERAL VASCULAR CATHETERIZATION N/A 01/28/2016   Procedure: A/V Shuntogram/Fistulagram;  Surgeon: Algernon Huxley, MD;  Location: [facility];  Service: Cardiovascular;  Laterality: N/A;  SOCIAL HISTORY:   Social History   Tobacco Use  . Smoking status: Former Research scientist (life sciences)  . Smokeless tobacco: Never Used  Substance Use Topics  . Alcohol use: No    FAMILY HISTORY:   Family History  Problem Relation  Age of Onset  . Hypertension Unknown   . Heart disease Unknown     DRUG ALLERGIES:   Allergies  Allergen Reactions  . Shellfish Allergy Anaphylaxis    REVIEW OF SYSTEMS:  CONSTITUTIONAL: No fever, fatigue or weakness.  EYES: No blurred or double vision.  EARS, NOSE, AND THROAT: No tinnitus or ear pain.  RESPIRATORY: No cough, shortness of breath, wheezing or hemoptysis.  CARDIOVASCULAR: No chest pain, orthopnea, edema.  GASTROINTESTINAL: No nausea, vomiting, diarrhea or abdominal pain.  GENITOURINARY: No dysuria, hematuria.  ENDOCRINE: No polyuria, nocturia,  HEMATOLOGY: No anemia, easy bruising or bleeding SKIN: No rash or lesion. MUSCULOSKELETAL: No joint pain or arthritis.   NEUROLOGIC: No tingling, numbness, weakness.  PSYCHIATRY: No anxiety or depression.   MEDICATIONS AT HOME:   Prior to Admission medications   Medication Sig Start Date End Date Taking? Authorizing Provider  acetaminophen (TYLENOL) 325 MG tablet Take 2 tablets (650 mg total) by mouth every 6 (six) hours as needed for mild pain (or Fever >/= 101). Patient not taking: Reported on 01/29/2018 01/24/18   Loletha Grayer, MD  allopurinol (ZYLOPRIM) 100 MG tablet Take 100 mg by mouth daily.    [provider]  amLODipine (NORVASC) 10 MG tablet Take 10 mg by mouth daily.     [provider]  aspirin EC 81 MG tablet Take 1 tablet (81 mg total) by mouth daily. Patient not taking: Reported on 01/20/2018 01/18/18   Algernon Huxley, MD  atorvastatin (LIPITOR) 10 MG tablet Take 1 tablet by mouth daily. 11/23/17   [provider]  calcium carbonate (TUMS - DOSED IN MG ELEMENTAL CALCIUM) 500 MG chewable tablet Chew 1 tablet (200 mg of elemental calcium total) by mouth 3 (three) times daily. Patient not taking: Reported on 01/29/2018 01/24/18   Loletha Grayer, MD  clonazePAM (KLONOPIN) 0.5 MG tablet Take 0.5 tablets (0.25 mg total) by mouth 2 (two) times daily as needed (insomnia). Patient not  taking: Reported on 01/29/2018 01/24/18   Loletha Grayer, MD  clopidogrel (PLAVIX) 75 MG tablet Take 1 tablet (75 mg total) by mouth daily. Patient not taking: Reported on 01/20/2018 12/03/15   Algernon Huxley, MD  colchicine 0.6 MG tablet Take 0.6 mg by mouth daily as needed (for gout flares).     [provider]  folic acid-vitamin b complex-vitamin c-selenium-zinc (DIALYVITE) 3 MG TABS tablet Take by mouth.    [provider]  furosemide (LASIX) 40 MG tablet Take 40 mg by mouth daily.     [provider]  hydrALAZINE (APRESOLINE) 25 MG tablet Take 25 mg by mouth daily.     [provider]  irbesartan (AVAPRO) 300 MG tablet Take 300 mg by mouth daily.     [provider]  metoprolol succinate (TOPROL XL) 25 MG 24 hr tablet Take 1 tablet (25 mg total) by mouth at bedtime. Patient not taking: Reported on 01/29/2018 01/24/18   Loletha Grayer, MD  multivitamin (RENA-VIT) TABS tablet Take 1 tablet by mouth at bedtime. Patient not taking: Reported on 01/20/2018 12/25/17   Fritzi Mandes, MD  multivitamin-lutein [facility]) CAPS capsule Take 1 capsule by mouth daily. Patient not taking: Reported on 01/20/2018 12/25/17   Fritzi Mandes, MD  Nutritional  Supplements (FEEDING SUPPLEMENT, NEPRO CARB STEADY,) LIQD Take 237 mLs by mouth 2 (two) times daily between meals. Patient not taking: Reported on 01/20/2018 12/25/17   Fritzi Mandes, MD  oxyCODONE-acetaminophen (PERCOCET/ROXICET) 5-325 MG tablet Take 1 tablet by mouth every 6 (six) hours as needed for moderate pain or severe pain. Patient not taking: Reported on 01/29/2018 01/24/18   Loletha Grayer, MD  polyethylene glycol [facility] Floria Raveling) packet Take 17 g by mouth daily as needed for mild constipation. Patient not taking: Reported on 01/29/2018 01/24/18   Loletha Grayer, MD      VITAL SIGNS:  Blood pressure (!) 168/93, pulse 97, temperature 98.3 F (36.8 C), temperature source Oral, resp. rate 18, height 5\' 8"   (1.727 m), weight 65.8 kg, SpO2 100 %.  PHYSICAL EXAMINATION:  GENERAL:  67 y.o.-year-old patient lying in the bed with no acute distress.  EYES: Pupils equal, round, reactive to light and accommodation. No scleral icterus. Extraocular muscles intact.  HEENT: Head atraumatic, normocephalic. Oropharynx and nasopharynx clear.  NECK:  Supple, no jugular venous distention. No thyroid enlargement, no tenderness.  LUNGS: Normal breath sounds bilaterally, no wheezing, rales,rhonchi or crepitation. No use of accessory muscles of respiration.  No anterior chest wall tenderness on palpation CARDIOVASCULAR: S1, S2 normal. No murmurs, rubs, or gallops.  ABDOMEN: Soft, nontender, nondistended. Bowel sounds present.  EXTREMITIES: No pedal edema, cyanosis, or clubbing.  Chronic peripheral vascular changes in the bilateral lower extremities NEUROLOGIC: Awake and alert and oriented x3 sensation intact. Gait not checked.  PSYCHIATRIC: The patient is alert and oriented x 3.  SKIN: No obvious rash, lesion, or ulcer.   LABORATORY PANEL:   CBC Recent Labs  Lab 01/29/18 0746  WBC 26.0*  HGB 12.3*  HCT 36.9*  PLT 361   ------------------------------------------------------------------------------------------------------------------  Chemistries  Recent Labs  Lab 01/29/18 0746  NA 137  K 4.4  CL 94*  CO2 25  GLUCOSE 99  BUN 34*  CREATININE 7.68*  CALCIUM 10.0  AST 40  ALT 10  ALKPHOS 76  BILITOT 0.9   ------------------------------------------------------------------------------------------------------------------  Cardiac Enzymes Recent Labs  Lab 01/29/18 0746  TROPONINI 0.09*   ------------------------------------------------------------------------------------------------------------------  RADIOLOGY:  Ct Angio Chest Pe W And/or Wo Contrast  Result Date: 01/29/2018 CLINICAL DATA:  Chest pain and shortness of breath. Renal failure patient receiving dialysis EXAM: CT ANGIOGRAPHY  CHEST WITH CONTRAST TECHNIQUE: Multidetector CT imaging of the chest was performed using the standard protocol during bolus administration of intravenous contrast. Multiplanar CT image reconstructions and MIPs were obtained to evaluate the vascular anatomy. CONTRAST:  80mL OMNIPAQUE IOHEXOL 350 MG/ML SOLN COMPARISON:  Chest CT Sep 28, 2015; chest radiograph January 29, 2018 FINDINGS: Cardiovascular: There is no demonstrable pulmonary embolus. There is no thoracic aortic aneurysm or dissection. There are scattered foci of calcification throughout the visualized great vessels with several areas of apparent hemodynamically significant obstruction in both subclavian arteries. There is aortic atherosclerosis as well as foci of coronary artery calcification. There is left ventricular hypertrophy. There is no pericardial effusion or pericardial thickening evident. Mediastinum/Nodes: Visualized thyroid appears unremarkable. There is no appreciable thoracic adenopathy. No esophageal lesions are evident. Lungs/Pleura: There is relatively mild bibasilar atelectatic change. There is no parenchymal lung edema or consolidation. There is a 2 mm nodular opacity abutting the pleura in the anterior segment right upper lobe seen on axial slice 43 series 6, stable from prior study. No appreciable pleural effusion or pleural thickening evident. Upper Abdomen: There is a 1.2 x 1.2 cm cyst  arising from the upper pole of the right kidney somewhat eccentrically. There is aortic and visualized great vessel atherosclerosis. Visualized upper abdominal structures appear unremarkable. Musculoskeletal: There are syndesmophytes throughout much of the thoracic region. No blastic or lytic bone lesions are evident. There is gynecomastia bilaterally. Review of the MIP images confirms the above findings. IMPRESSION: 1. No demonstrable pulmonary embolus. No thoracic aortic aneurysm or dissection. There is aortic atherosclerosis. There are foci of  coronary artery calcification. There is extensive great vessel atherosclerotic calcification with areas of hemodynamically significant obstruction in each mid subclavian artery. 2.  Left ventricular hypertrophy. 3. Areas of atelectatic change. No lung edema or consolidation evident. 2 mm nodular opacity right upper lobe, unchanged from 2017 study. 4.  No evident thoracic adenopathy. 5. Apparent syndesmophytes in the thoracic region. Question underlying seronegative spondyloarthropathy. 6.  Gynecomastia bilaterally. Aortic Atherosclerosis (ICD10-I70.0). Electronically Signed   By: Lowella Grip III M.D.   On: 01/29/2018 11:03   Dg Chest Portable 1 View  Result Date: 01/29/2018 CLINICAL DATA:  Chest pain EXAM: PORTABLE CHEST 1 VIEW COMPARISON:  January 20, 2018 FINDINGS: No edema or consolidation. The heart size and pulmonary vascularity are normal. No adenopathy. No bone lesions. There are stents in the left axillary and left brachial regions. There is right axillary and subclavian artery calcification. IMPRESSION: No edema or consolidation.  Stable cardiac silhouette. Electronically Signed   By: Lowella Grip III M.D.   On: 01/29/2018 08:05    EKG:   Orders placed or performed during the hospital encounter of 01/29/18  . EKG 12-Lead  . EKG 12-Lead  . ED EKG within 10 minutes  . ED EKG within 10 minutes  . EKG 12-Lead  . EKG 12-Lead    IMPRESSION AND PLAN:    #Chest pain with inverted T wave  Admit to telemetry Troponin 0 0.09 but previous troponins were at around 0.17 in the setting of end-stage renal disease EKG with T wave inversions in the lateral leads but no significant change from prior EKG Cycle troponins Previous echocardiogram 11/2017 with ejection fraction 70 to 75% Recent [facility] July 2019 with no evidence of ischemia Continue home medication aspirin, Plavix Cardiology consult placed to [facility]   #End-stage renal disease Patient could not complete his  hemodialysis today in view of chest pain Nephrology consult placed, seen by Dr. Candiss Norse , patient is ordered to get 2-1/2 hours of treatment today to complete his treatment of hemodialysis  #Anemia of chronic kidney disease Continue close monitoring of hemoglobin  #Gout-continue home medications allopurinol and colchicine  #Hyperlipidemia continue Lipitor   #Peripheral vascular disease continue current treatment and outpatient follow-up with vascular surgery as recommended  All the records are reviewed and case discussed with ED provider. Management plans discussed with the patient, family and they are in agreement.  CODE STATUS: FC   TOTAL TIME TAKING CARE OF THIS PATIENT: 43  minutes.   Note: This dictation was prepared with Dragon dictation along with smaller phrase technology. Any transcriptional errors that result from this process are unintentional.  Nicholes Mango M.D on 01/29/2018 at 3:31 PM  Between 7am to 6pm - Pager - [phone]  After 6pm go to [email or web address] - password [email or web address] [facility]  Office  [phone]  CC: Primary care physician; [name], MD

## 2018-01-29 NOTE — Progress Notes (Signed)
Pre HD Assessment     01/29/18 1817  Neurological  Level of Consciousness Alert  Orientation Level Oriented to person;Oriented to place;Oriented to situation;Disoriented to time  Respiratory  Respiratory Pattern Regular;Unlabored;Symmetrical  Chest Assessment Chest expansion symmetrical  Bilateral Breath Sounds Clear  Cough Non-productive  Cardiac  Pulse Regular  Heart Sounds S1, S2  Jugular Venous Distention (JVD) No  ECG Monitor Yes  Cardiac Rhythm NSR  Ectopy Multifocal PVC's  Ectopy Frequency Frequent  Antiarrhythmic device No  Vascular  R Radial Pulse +1  L Radial Pulse +1  Edema Generalized  Generalized Edema +1  Integumentary  Integumentary (WDL) X  Skin Condition Dry;Flaky  Musculoskeletal  Musculoskeletal (WDL) X  Generalized Weakness Yes  GU Assessment  Genitourinary (WDL) X (HD pt)  Psychosocial  Psychosocial (WDL) X  Patient Behaviors Flat affect

## 2018-01-29 NOTE — Progress Notes (Signed)
HD Treatment Initiated    01/29/18 1821  Vital Signs  Pulse Rate 86  Resp 19  Oxygen Therapy  SpO2 100 %  O2 Device Room Air  During Hemodialysis Assessment  Blood Flow Rate (mL/min) 400 mL/min  Arterial Pressure (mmHg) -140 mmHg  Venous Pressure (mmHg) 160 mmHg  Transmembrane Pressure (mmHg) 50 mmHg  Ultrafiltration Rate (mL/min) 200 mL/min  Dialysate Flow Rate (mL/min) 800 ml/min  Conductivity: Machine  14  HD Safety Checks Performed Yes  Dialysis Fluid Bolus Normal Saline  Bolus Amount (mL) 250 mL  Intra-Hemodialysis Comments Tx initiated;Progressing as prescribed  Fistula / Graft Left Upper arm  No Placement Date or Time found.   Orientation: Left  Access Location: Upper arm  Status Accessed  Needle Size 15

## 2018-01-29 NOTE — ED Notes (Signed)
Pt transported to 250

## 2018-01-29 NOTE — Progress Notes (Signed)
Family Meeting Note  Advance Directive:yes  Today a meeting took place with the Patient.    The following clinical team members were present during this meeting:MD  The following were discussed:Patient's diagnosis: Chest pain, elevated troponin, other comorbidities as documented below, treatment plan of care discussed in detail with the patient.  He verbalized understanding of the plan.    End-stage renal disease Secondary hyperparathyroidism Anemia of chronic kidney disease Gout Hyperlipidemia Hypertension Peripheral vascular disease    patient's progosis: Unable to determine and Goals for treatment: Full Code, sister [employer]  Additional follow-up to be provided: Hospitalist, nephrology  Time spent during discussion:17 min  Nicholes Mango, MD

## 2018-01-30 LAB — CBC
HCT: 33.9 % — ABNORMAL LOW (ref 40.0–52.0)
Hemoglobin: 11.1 g/dL — ABNORMAL LOW (ref 13.0–18.0)
MCH: 31.1 pg (ref 26.0–34.0)
MCHC: 32.8 g/dL (ref 32.0–36.0)
MCV: 95 fL (ref 80.0–100.0)
Platelets: 318 10*3/uL (ref 150–440)
RBC: 3.57 MIL/uL — ABNORMAL LOW (ref 4.40–5.90)
RDW: 16.2 % — AB (ref 11.5–14.5)
WBC: 10.4 10*3/uL (ref 3.8–10.6)

## 2018-01-30 NOTE — Care Management Obs Status (Signed)
[facility] NOTIFICATION   Patient Details  Name: Johnny [name]. MRN: [id number] Date of Birth: May 22, 1950   Medicare Observation Status Notification Given:  Yes(Signature would not work on laptop)    Elza Rafter, RN 01/30/2018, 3:12 PM

## 2018-01-30 NOTE — Plan of Care (Signed)
  Problem: Clinical Measurements: Goal: Cardiovascular complication will be avoided Outcome: Progressing   Problem: Education: Goal: Knowledge of General Education information will improve Description Including pain rating scale, medication(s)/side effects and non-pharmacologic comfort measures Outcome: Not Progressing   Problem: Nutrition: Goal: Adequate nutrition will be maintained Outcome: Not Progressing  Pt not eating any of his meal trays today, took 3 nepro drinks, and good water intake.

## 2018-01-30 NOTE — Consult Note (Signed)
[facility] Nurse wound consult note Reason for Consult: ulcers legs and feet  Wound type: arterial ulcerations right 5th toe, right heel unstageable pressure injury in the presence of arterial compromise, arterial ulceration left 3rd toe, left lateral foot and left medial ankle.  PAD bilateral LE, followed by vascular and has been seen by podiatry.  I have reviewed their notes as well Pressure Injury POA: NA Measurement: multiple intact eschar, right heel aprox. 2cm x 4cm x 0cm; left lateral 1cm x 5cm x 0cm ; left medial 2cm x 3cm x 0cm area of scattered eschar; left 3rd toe 0.3cm 0.3cm x 0cm Wound bed:all the wounds are stable, dry 100% eschar, no fluctuance  Drainage (amount, consistency, odor) none, however there is some blood on the sheet, he moves his legs around a lot and throws his feet out of the bed, no active drainage appreciated at the time of my assessment Periwound: intact, no edema, weak DP pulses  Dressing procedure/placement/frequency: Betadine to keep dry and stable, allow to air dry.  Pad right heel ulcer with 4x4 gauze and wrap both with kerlix.  Float heels with pillows, considered Prevalon boots but patient moves around so much even though not ambulatory do not feel they will stay in place.   Explained need to keep heel off the bed with pillows to patient or hanging off the edge of the bed can keep the pressure off.  Follow up with vascular as scheduled.   Discussed POC with patient and bedside nurse.  Re consult if needed, will not follow at this time. Thanks  Jerrye Seebeck [employer], RN,CWOCN, CNS, [place] [phone])

## 2018-01-30 NOTE — Progress Notes (Signed)
[facility] [place] NAME: Johnny [name]    MR#:  [id number]  DATE OF BIRTH:  12-26-1950  SUBJECTIVE:   Patient presented to the hospital due to atypical chest pain at dialysis.  This has resolved now patient's Trop. Are flat.  Leukocytosis has resolved.   REVIEW OF SYSTEMS:    Review of Systems  Constitutional: Negative for chills and fever.  HENT: Negative for congestion and tinnitus.   Eyes: Negative for blurred vision and double vision.  Respiratory: Negative for cough, shortness of breath and wheezing.   Cardiovascular: Negative for chest pain, orthopnea and PND.  Gastrointestinal: Negative for abdominal pain, diarrhea, nausea and vomiting.  Genitourinary: Negative for dysuria and hematuria.  Neurological: Negative for dizziness, sensory change and focal weakness.  All other systems reviewed and are negative.   Nutrition: Renal  Tolerating Diet: yes Tolerating PT: Await Eval.   DRUG ALLERGIES:   Allergies  Allergen Reactions  . Shellfish Allergy Anaphylaxis    VITALS:  Blood pressure 135/64, pulse 92, temperature 98.1 F (36.7 C), temperature source Oral, resp. rate 18, height 5\' 8"  (1.727 m), weight 60.2 kg, SpO2 100 %.  PHYSICAL EXAMINATION:   Physical Exam  GENERAL:  67 y.o.-year-old patient lying in bed lethargic but follows simple commands.   EYES: Pupils equal, round, reactive to light and accommodation. No scleral icterus. Extraocular muscles intact.  HEENT: Head atraumatic, normocephalic. Oropharynx and nasopharynx clear.  NECK:  Supple, no jugular venous distention. No thyroid enlargement, no tenderness.  LUNGS: Normal breath sounds bilaterally, no wheezing, rales, rhonchi. No use of accessory muscles of respiration.  CARDIOVASCULAR: S1, S2 normal. No murmurs, rubs, or gallops.  ABDOMEN: Soft, nontender, nondistended. Bowel sounds present. No organomegaly or mass.  EXTREMITIES: No cyanosis, clubbing or edema  b/l.    NEUROLOGIC: Cranial nerves II through XII are intact. No focal Motor or sensory deficits b/l. Globally weak   PSYCHIATRIC: The patient is alert and oriented x 3.  SKIN: No obvious rash, lesion, or ulcer.   Left Upper extremity AV fistula with good bruit and thrill.  LABORATORY PANEL:   CBC Recent Labs  Lab 01/30/18 0937  WBC 10.4  HGB 11.1*  HCT 33.9*  PLT 318   ------------------------------------------------------------------------------------------------------------------  Chemistries  Recent Labs  Lab 01/29/18 0746  NA 137  K 4.4  CL 94*  CO2 25  GLUCOSE 99  BUN 34*  CREATININE 7.68*  CALCIUM 10.0  AST 40  ALT 10  ALKPHOS 76  BILITOT 0.9   ------------------------------------------------------------------------------------------------------------------  Cardiac Enzymes Recent Labs  Lab 01/29/18 2257  TROPONINI 0.07*   ------------------------------------------------------------------------------------------------------------------  RADIOLOGY:  Ct Angio Chest Pe W And/or Wo Contrast  Result Date: 01/29/2018 CLINICAL DATA:  Chest pain and shortness of breath. Renal failure patient receiving dialysis EXAM: CT ANGIOGRAPHY CHEST WITH CONTRAST TECHNIQUE: Multidetector CT imaging of the chest was performed using the standard protocol during bolus administration of intravenous contrast. Multiplanar CT image reconstructions and MIPs were obtained to evaluate the vascular anatomy. CONTRAST:  6mL OMNIPAQUE IOHEXOL 350 MG/ML SOLN COMPARISON:  Chest CT Sep 28, 2015; chest radiograph January 29, 2018 FINDINGS: Cardiovascular: There is no demonstrable pulmonary embolus. There is no thoracic aortic aneurysm or dissection. There are scattered foci of calcification throughout the visualized great vessels with several areas of apparent hemodynamically significant obstruction in both subclavian arteries. There is aortic atherosclerosis as well as foci of coronary artery  calcification. There is left ventricular hypertrophy. There is no  pericardial effusion or pericardial thickening evident. Mediastinum/Nodes: Visualized thyroid appears unremarkable. There is no appreciable thoracic adenopathy. No esophageal lesions are evident. Lungs/Pleura: There is relatively mild bibasilar atelectatic change. There is no parenchymal lung edema or consolidation. There is a 2 mm nodular opacity abutting the pleura in the anterior segment right upper lobe seen on axial slice 43 series 6, stable from prior study. No appreciable pleural effusion or pleural thickening evident. Upper Abdomen: There is a 1.2 x 1.2 cm cyst arising from the upper pole of the right kidney somewhat eccentrically. There is aortic and visualized great vessel atherosclerosis. Visualized upper abdominal structures appear unremarkable. Musculoskeletal: There are syndesmophytes throughout much of the thoracic region. No blastic or lytic bone lesions are evident. There is gynecomastia bilaterally. Review of the MIP images confirms the above findings. IMPRESSION: 1. No demonstrable pulmonary embolus. No thoracic aortic aneurysm or dissection. There is aortic atherosclerosis. There are foci of coronary artery calcification. There is extensive great vessel atherosclerotic calcification with areas of hemodynamically significant obstruction in each mid subclavian artery. 2.  Left ventricular hypertrophy. 3. Areas of atelectatic change. No lung edema or consolidation evident. 2 mm nodular opacity right upper lobe, unchanged from 2017 study. 4.  No evident thoracic adenopathy. 5. Apparent syndesmophytes in the thoracic region. Question underlying seronegative spondyloarthropathy. 6.  Gynecomastia bilaterally. Aortic Atherosclerosis (ICD10-I70.0). Electronically Signed   By: Lowella Grip III M.D.   On: 01/29/2018 11:03   Dg Chest Portable 1 View  Result Date: 01/29/2018 CLINICAL DATA:  Chest pain EXAM: PORTABLE CHEST 1 VIEW  COMPARISON:  January 20, 2018 FINDINGS: No edema or consolidation. The heart size and pulmonary vascularity are normal. No adenopathy. No bone lesions. There are stents in the left axillary and left brachial regions. There is right axillary and subclavian artery calcification. IMPRESSION: No edema or consolidation.  Stable cardiac silhouette. Electronically Signed   By: Lowella Grip III M.D.   On: 01/29/2018 08:05     ASSESSMENT AND PLAN:   67 year old male with past medical history of hypertension, hyperlipidemia, gout, peripheral vascular disease, stage renal disease on hemodialysis, anxiety who presents to the hospital due to chest pain.  1.  Chest pain- patient symptoms are somewhat atypical and his pain has resolved.  He does have significant cardiac risk factors therefore was observed in the hospital overnight.  Patient's troponins have remained flat. - Seen by cardiology and no plans for acute intervention.  They did not appreciate any new EKG changes. -Continue aspirin, atorvastatin, metoprolol, Plavix  2.  End-stage renal disease on hemodialysis- patient gets dialysis on Monday Wednesday and Friday.  Had HD yesterday, appreciate nephrology input plan for dialysis tomorrow.  3.  Lower extremity ulcerations on the right foot-secondary to peripheral artery disease. -Seen by wound care and continue local wound care for now.  4.  Essential hypertension-continue metoprolol, hydralazine, Avapro, Norvasc  5.  History of gout-no acute attack.  Continue colchicine.  Physical therapy evaluation and possible discharge after dialysis tomorrow  All the records are reviewed and case discussed with Care Management/Social Worker. Management plans discussed with the patient, family and they are in agreement.  CODE STATUS: full code  DVT Prophylaxis: hep. SQ  TOTAL TIME TAKING CARE OF THIS PATIENT: 30 minutes.   POSSIBLE D/C IN 1-2 DAYS, DEPENDING ON CLINICAL  CONDITION.   Henreitta Leber M.D on 01/30/2018 at 3:19 PM  Between 7am to 6pm - Pager - [phone]  After 6pm go to [email or web address] - [place]  [facility]  Office  [phone]  CC: Primary care physician; [name], MD

## 2018-01-30 NOTE — Plan of Care (Signed)
  Problem: Nutrition: Goal: Adequate nutrition will be maintained Outcome: Progressing Note:  Pt ate half of a [place] sandwich tray   Problem: Coping: Goal: Level of anxiety will decrease Outcome: Progressing   Problem: Pain Managment: Goal: General experience of comfort will improve Outcome: Progressing Note:  No complaints of pain this shift   Problem: Safety: Goal: Ability to remain free from injury will improve Outcome: Progressing   Problem: Skin Integrity: Goal: Risk for impaired skin integrity will decrease Outcome: Not Progressing Note:  Wound consult placed, multiple ulcers on bilateral lower legs & feet.

## 2018-01-30 NOTE — Plan of Care (Signed)

## 2018-01-30 NOTE — Progress Notes (Signed)
[facility], [place] 01/30/18  Subjective:   Patient sent from HD unit for chest pain At present it is resolved Work up so far shows CT chest is negative Cardiology evaluation is ongoing Nursing staff reports periods of confusion Appetite remains poor  Objective:  Vital signs in last 24 hours:  Temp:  [98.2 F (36.8 C)-99 F (37.2 C)] 98.4 F (36.9 C) (09/24 0800) Pulse Rate:  [86-106] 91 (09/24 0800) Resp:  [11-35] 18 (09/24 0800) BP: (141-184)/(74-95) 161/88 (09/24 0800) SpO2:  [100 %] 100 % (09/24 0800) Weight:  [60.2 kg-61.7 kg] 60.2 kg (09/23 2145)  Weight change:  Filed Weights   01/29/18 1815 01/29/18 2100 01/29/18 2145  Weight: 61.7 kg 61.2 kg 60.2 kg    Intake/Output:    Intake/Output Summary (Last 24 hours) at 01/30/2018 1341 Last data filed at 01/29/2018 2301 Gross per 24 hour  Intake -  Output -23 ml  Net 23 ml     Physical Exam: General: NAD, chronically ill appearing  HEENT Anicteric, moist oral mucus membranes  Neck supple  Pulm/lungs Clear b/l  CVS/Heart No rub  Abdomen:  Soft, NT  Extremities: Trace dependent edema, rt foot 5th toe necrotic  Neurologic: Alert, follows commands  Skin: No acute rashes  Access: Left arm AVG       Basic Metabolic Panel:  Recent Labs  Lab 01/29/18 0746 01/29/18 1617  NA 137  --   K 4.4  --   CL 94*  --   CO2 25  --   GLUCOSE 99  --   BUN 34*  --   CREATININE 7.68*  --   CALCIUM 10.0  --   PHOS  --  3.3     CBC: Recent Labs  Lab 01/29/18 0746 01/30/18 0937  WBC 26.0* 10.4  HGB 12.3* 11.1*  HCT 36.9* 33.9*  MCV 95.0 95.0  PLT 361 318      Lab Results  Component Value Date   HEPBSAG Negative 09/30/2015      Microbiology:  Recent Results (from the past 240 hour(s))  Culture, blood (Routine X 2) w Reflex to ID Panel     Status: None (Preliminary result)   Collection Time: 01/29/18 10:56 PM  Result Value Ref Range Status   Specimen Description BLOOD  BLOOD RIGHT HAND  Final   Special Requests   Final    BOTTLES DRAWN AEROBIC AND ANAEROBIC Blood Culture results may not be optimal due to an inadequate volume of blood received in culture bottles   Culture   Final    NO GROWTH < 12 HOURS Performed at [facility], [address] [place], [place] [address]    Report Status PENDING  Incomplete  Culture, blood (Routine X 2) w Reflex to ID Panel     Status: None (Preliminary result)   Collection Time: 01/29/18 10:58 PM  Result Value Ref Range Status   Specimen Description BLOOD BLOOD RIGHT HAND  Final   Special Requests   Final    BOTTLES DRAWN AEROBIC AND ANAEROBIC Blood Culture results may not be optimal due to an inadequate volume of blood received in culture bottles   Culture   Final    NO GROWTH < 12 HOURS Performed at [facility], [address] [place], [place] [address]    Report Status PENDING  Incomplete    Coagulation Studies: No results for input(s): LABPROT, INR in the last 72 hours.  Urinalysis: No results for input(s): COLORURINE,  LABSPEC, PHURINE, GLUCOSEU, HGBUR, BILIRUBINUR, KETONESUR, PROTEINUR, UROBILINOGEN, NITRITE, LEUKOCYTESUR in the last 72 hours.  Invalid input(s): APPERANCEUR    Imaging: Ct Angio Chest Pe W And/or Wo Contrast  Result Date: 01/29/2018 CLINICAL DATA:  Chest pain and shortness of breath. Renal failure patient receiving dialysis EXAM: CT ANGIOGRAPHY CHEST WITH CONTRAST TECHNIQUE: Multidetector CT imaging of the chest was performed using the standard protocol during bolus administration of intravenous contrast. Multiplanar CT image reconstructions and MIPs were obtained to evaluate the vascular anatomy. CONTRAST:  70mL OMNIPAQUE IOHEXOL 350 MG/ML SOLN COMPARISON:  Chest CT Sep 28, 2015; chest radiograph January 29, 2018 FINDINGS: Cardiovascular: There is no demonstrable pulmonary embolus. There is no thoracic aortic aneurysm or dissection. There are scattered foci of  calcification throughout the visualized great vessels with several areas of apparent hemodynamically significant obstruction in both subclavian arteries. There is aortic atherosclerosis as well as foci of coronary artery calcification. There is left ventricular hypertrophy. There is no pericardial effusion or pericardial thickening evident. Mediastinum/Nodes: Visualized thyroid appears unremarkable. There is no appreciable thoracic adenopathy. No esophageal lesions are evident. Lungs/Pleura: There is relatively mild bibasilar atelectatic change. There is no parenchymal lung edema or consolidation. There is a 2 mm nodular opacity abutting the pleura in the anterior segment right upper lobe seen on axial slice 43 series 6, stable from prior study. No appreciable pleural effusion or pleural thickening evident. Upper Abdomen: There is a 1.2 x 1.2 cm cyst arising from the upper pole of the right kidney somewhat eccentrically. There is aortic and visualized great vessel atherosclerosis. Visualized upper abdominal structures appear unremarkable. Musculoskeletal: There are syndesmophytes throughout much of the thoracic region. No blastic or lytic bone lesions are evident. There is gynecomastia bilaterally. Review of the MIP images confirms the above findings. IMPRESSION: 1. No demonstrable pulmonary embolus. No thoracic aortic aneurysm or dissection. There is aortic atherosclerosis. There are foci of coronary artery calcification. There is extensive great vessel atherosclerotic calcification with areas of hemodynamically significant obstruction in each mid subclavian artery. 2.  Left ventricular hypertrophy. 3. Areas of atelectatic change. No lung edema or consolidation evident. 2 mm nodular opacity right upper lobe, unchanged from 2017 study. 4.  No evident thoracic adenopathy. 5. Apparent syndesmophytes in the thoracic region. Question underlying seronegative spondyloarthropathy. 6.  Gynecomastia bilaterally. Aortic  Atherosclerosis (ICD10-I70.0). Electronically Signed   By: Lowella Grip III M.D.   On: 01/29/2018 11:03   Dg Chest Portable 1 View  Result Date: 01/29/2018 CLINICAL DATA:  Chest pain EXAM: PORTABLE CHEST 1 VIEW COMPARISON:  January 20, 2018 FINDINGS: No edema or consolidation. The heart size and pulmonary vascularity are normal. No adenopathy. No bone lesions. There are stents in the left axillary and left brachial regions. There is right axillary and subclavian artery calcification. IMPRESSION: No edema or consolidation.  Stable cardiac silhouette. Electronically Signed   By: Lowella Grip III M.D.   On: 01/29/2018 08:05     Medications:    . allopurinol  100 mg Oral Daily  . amLODipine  10 mg Oral Daily  . aspirin EC  81 mg Oral Daily  . atorvastatin  10 mg Oral Daily  . calcium carbonate  200 mg of elemental calcium Oral TID  . clopidogrel  75 mg Oral Daily  . feeding supplement (NEPRO CARB STEADY)  237 mL Oral BID BM  . furosemide  40 mg Oral Daily  . heparin injection (subcutaneous)  5,000 Units Subcutaneous Q8H  . hydrALAZINE  25 mg Oral  Daily  . irbesartan  300 mg Oral Daily  . metoprolol succinate  25 mg Oral QHS  . multivitamin  1 tablet Oral QHS  . multivitamin-lutein  1 capsule Oral Daily   acetaminophen, clonazepam, colchicine, ondansetron (ZOFRAN) IV, polyethylene glycol  Assessment/ Plan:  67 y.o. male with end stage renal disease on hemodialysis, congestive heart failure, hypertension, peripheral vascular disease  [place]. MWF L AVF  61kg  1.  End-stage renal disease 2.  Anemia of chronic kidney disease- EPO with HD 3.  Secondary hyperparathyroidism- Continue home dose of binders with meals 4.  Chest pain 5.  Multiple arterial and pressure ulcers on b/l feet  Work-up for chest pain is in progress.  It appears to have resolved at present Next HD planned for tomorrow Wounds assessed by wound care nurse We will follow    LOS:  0 Cristoval Teall Candiss Norse 9/24/20191:41 PM  [place], [place]  Note: This note was prepared with Dragon dictation. Any transcription errors are unintentional

## 2018-01-30 NOTE — Progress Notes (Signed)
[facility]  SUBJECTIVE: The patient denies recurrent significant chest pain. He denies shortness of breath at rest lying in bed.   Vitals:   01/29/18 2145 01/30/18 0434 01/30/18 0623 01/30/18 0800  BP: (!) 148/76 (!) 184/74 (!) 156/77 (!) 161/88  Pulse: (!) 104 93 92 91  Resp: 18 20  18   Temp: 98.5 F (36.9 C) 98.4 F (36.9 C)  98.4 F (36.9 C)  TempSrc: Oral Oral  Oral  SpO2: 100% 100%  100%  Weight: 60.2 kg     Height:         Intake/Output Summary (Last 24 hours) at 01/30/2018 1307 Last data filed at 01/29/2018 2301 Gross per 24 hour  Intake -  Output -23 ml  Net 23 ml      PHYSICAL EXAM  General: Chronically ill appearing male lying supine in bed in no acute distress HEENT:  Normocephalic and atramatic Neck:  No JVD.  Lungs: Normal effort of breathing on room air. Heart: HRRR . Normal S1 and S2 without gallops or murmurs.  Abdomen: Abdomen nondistended Msk:  Gait not assessed, no obvious deformity. Extremities: No clubbing, cyanosis or edema.   Neuro: Alert and oriented X 3. Psych:  Flat affect, responds appropriately   LABS: Basic Metabolic Panel: Recent Labs    01/29/18 0746 01/29/18 1617  NA 137  --   K 4.4  --   CL 94*  --   CO2 25  --   GLUCOSE 99  --   BUN 34*  --   CREATININE 7.68*  --   CALCIUM 10.0  --   PHOS  --  3.3   Liver Function Tests: Recent Labs    01/29/18 0746  AST 40  ALT 10  ALKPHOS 76  BILITOT 0.9  PROT 8.2*  ALBUMIN 3.5   No results for input(s): LIPASE, AMYLASE in the last 72 hours. CBC: Recent Labs    01/29/18 0746 01/30/18 0937  WBC 26.0* 10.4  HGB 12.3* 11.1*  HCT 36.9* 33.9*  MCV 95.0 95.0  PLT 361 318   Cardiac Enzymes: Recent Labs    01/29/18 1317 01/29/18 1617 01/29/18 2257  TROPONINI 0.07* 0.08* 0.07*   BNP: Invalid input(s): POCBNP D-Dimer: No results for input(s): DDIMER in the last 72 hours. Hemoglobin A1C: No results for input(s): HGBA1C in the last 72 hours. Fasting Lipid  Panel: No results for input(s): CHOL, HDL, LDLCALC, TRIG, CHOLHDL, LDLDIRECT in the last 72 hours. Thyroid Function Tests: No results for input(s): TSH, T4TOTAL, T3FREE, THYROIDAB in the last 72 hours.  Invalid input(s): FREET3 Anemia Panel: No results for input(s): VITAMINB12, FOLATE, FERRITIN, TIBC, IRON, RETICCTPCT in the last 72 hours.  Ct Angio Chest Pe W And/or Wo Contrast  Result Date: 01/29/2018 CLINICAL DATA:  Chest pain and shortness of breath. Renal failure patient receiving dialysis EXAM: CT ANGIOGRAPHY CHEST WITH CONTRAST TECHNIQUE: Multidetector CT imaging of the chest was performed using the standard protocol during bolus administration of intravenous contrast. Multiplanar CT image reconstructions and MIPs were obtained to evaluate the vascular anatomy. CONTRAST:  36mL OMNIPAQUE IOHEXOL 350 MG/ML SOLN COMPARISON:  Chest CT Sep 28, 2015; chest radiograph January 29, 2018 FINDINGS: Cardiovascular: There is no demonstrable pulmonary embolus. There is no thoracic aortic aneurysm or dissection. There are scattered foci of calcification throughout the visualized great vessels with several areas of apparent hemodynamically significant obstruction in both subclavian arteries. There is aortic atherosclerosis as well as foci of coronary artery calcification. There is left ventricular hypertrophy.  There is no pericardial effusion or pericardial thickening evident. Mediastinum/Nodes: Visualized thyroid appears unremarkable. There is no appreciable thoracic adenopathy. No esophageal lesions are evident. Lungs/Pleura: There is relatively mild bibasilar atelectatic change. There is no parenchymal lung edema or consolidation. There is a 2 mm nodular opacity abutting the pleura in the anterior segment right upper lobe seen on axial slice 43 series 6, stable from prior study. No appreciable pleural effusion or pleural thickening evident. Upper Abdomen: There is a 1.2 x 1.2 cm cyst arising from the upper  pole of the right kidney somewhat eccentrically. There is aortic and visualized great vessel atherosclerosis. Visualized upper abdominal structures appear unremarkable. Musculoskeletal: There are syndesmophytes throughout much of the thoracic region. No blastic or lytic bone lesions are evident. There is gynecomastia bilaterally. Review of the MIP images confirms the above findings. IMPRESSION: 1. No demonstrable pulmonary embolus. No thoracic aortic aneurysm or dissection. There is aortic atherosclerosis. There are foci of coronary artery calcification. There is extensive great vessel atherosclerotic calcification with areas of hemodynamically significant obstruction in each mid subclavian artery. 2.  Left ventricular hypertrophy. 3. Areas of atelectatic change. No lung edema or consolidation evident. 2 mm nodular opacity right upper lobe, unchanged from 2017 study. 4.  No evident thoracic adenopathy. 5. Apparent syndesmophytes in the thoracic region. Question underlying seronegative spondyloarthropathy. 6.  Gynecomastia bilaterally. Aortic Atherosclerosis (ICD10-I70.0). Electronically Signed   By: Lowella Grip III M.D.   On: 01/29/2018 11:03   Dg Chest Portable 1 View  Result Date: 01/29/2018 CLINICAL DATA:  Chest pain EXAM: PORTABLE CHEST 1 VIEW COMPARISON:  January 20, 2018 FINDINGS: No edema or consolidation. The heart size and pulmonary vascularity are normal. No adenopathy. No bone lesions. There are stents in the left axillary and left brachial regions. There is right axillary and subclavian artery calcification. IMPRESSION: No edema or consolidation.  Stable cardiac silhouette. Electronically Signed   By: Lowella Grip III M.D.   On: 01/29/2018 08:05     Echo: LVEF 70-75%  TELEMETRY: Sinus rhythm  ASSESSMENT AND PLAN:  Active Problems:   Chest pain    1. Chest pain, which occurred during dialysis yesterday, without recurrent chest pain, with borderline elevated troponin at  baseline in the setting of renal disease, with normal LV funtion, with negative recent Lexiscan Myoview. ECG abnormal, though appears overall unchanged from previous. Troponin 0.09, 0.07, 0.08, followed by 0.07. 2. Elevated WBC (26 down to 10.4 today) 3. End stage renal disease  Recommendations: 1. Defer further cardiac diagnostics 2. Defer cardiac catheterization in the absence of recurrent chest pain with recent stress test normal 3. Continue aspirin, Plavix, atorvastatin, metoprolol, irbesartan 4. Recommend follow-up as outpatient with Dr. Clayborn Bigness 5. Sign off for now; call with any questions.   Sharolyn Douglas 01/30/2018 1:07 PM

## 2018-01-31 LAB — HEPATITIS B SURFACE ANTIBODY,QUALITATIVE: Hep B S Ab: REACTIVE

## 2018-01-31 MED ORDER — SODIUM CHLORIDE 0.9% FLUSH
3.0000 mL | Freq: Two times a day (BID) | INTRAVENOUS | Status: DC
  Administered 2018-01-31 – 2018-02-01 (×2): 3 mL via INTRAVENOUS

## 2018-01-31 MED ORDER — HYDROCODONE-ACETAMINOPHEN 5-325 MG PO TABS
1.0000 | ORAL_TABLET | ORAL | Status: DC | PRN
  Administered 2018-01-31 – 2018-02-01 (×4): 1 via ORAL
  Filled 2018-01-31 (×6): qty 1

## 2018-01-31 MED ORDER — MORPHINE SULFATE (PF) 2 MG/ML IV SOLN
1.0000 mg | INTRAVENOUS | Status: AC
  Administered 2018-01-31: 1 mg via INTRAVENOUS
  Filled 2018-01-31: qty 1
  Filled 2018-01-31: qty 0.5

## 2018-01-31 NOTE — Progress Notes (Signed)
MD notified pt c/o 10/10 leg pain requesting pain medication, awaiting appropriate order.

## 2018-01-31 NOTE — Progress Notes (Signed)
[facility] [place] NAME: Johnny [name]    MR#:  [id number]  DATE OF BIRTH:  [birth year]  SUBJECTIVE:   Patient denies any chest pain, shortness of breath.  He complains of lower extremity/foot pain.  Patient had dialysis today and tolerated it.  REVIEW OF SYSTEMS:    Review of Systems  Constitutional: Negative for chills and fever.  HENT: Negative for congestion and tinnitus.   Eyes: Negative for blurred vision and double vision.  Respiratory: Negative for cough, shortness of breath and wheezing.   Cardiovascular: Negative for chest pain, orthopnea and PND.  Gastrointestinal: Negative for abdominal pain, diarrhea, nausea and vomiting.  Genitourinary: Negative for dysuria and hematuria.  Neurological: Negative for dizziness, sensory change and focal weakness.  All other systems reviewed and are negative.   Nutrition: Renal  Tolerating Diet: yes Tolerating PT: Await Eval.   DRUG ALLERGIES:   Allergies  Allergen Reactions  . Shellfish Allergy Anaphylaxis    VITALS:  Blood pressure (!) 176/79, pulse 93, temperature 97.6 F (36.4 C), temperature source Oral, resp. rate (!) 21, height 5\' 8"  (1.727 m), weight 62 kg, SpO2 100 %.  PHYSICAL EXAMINATION:   Physical Exam  GENERAL:  67 y.o.-year-old patient lying in bed lethargic but follows simple commands.   EYES: Pupils equal, round, reactive to light and accommodation. No scleral icterus. Extraocular muscles intact.  HEENT: Head atraumatic, normocephalic. Oropharynx and nasopharynx clear.  NECK:  Supple, no jugular venous distention. No thyroid enlargement, no tenderness.  LUNGS: Normal breath sounds bilaterally, no wheezing, rales, rhonchi. No use of accessory muscles of respiration.  CARDIOVASCULAR: S1, S2 normal. No murmurs, rubs, or gallops.  ABDOMEN: Soft, nontender, nondistended. Bowel sounds present. No organomegaly or mass.  EXTREMITIES: No cyanosis, clubbing or edema b/l.     NEUROLOGIC: Cranial nerves II through XII are intact. No focal Motor or sensory deficits b/l. Globally weak   PSYCHIATRIC: The patient is alert and oriented x 3.  SKIN: No obvious rash, lesion, or ulcer. B/l LE/foot ulcers.    Left Upper extremity AV fistula with good bruit and thrill.  LABORATORY PANEL:   CBC Recent Labs  Lab 01/30/18 0937  WBC 10.4  HGB 11.1*  HCT 33.9*  PLT 318   ------------------------------------------------------------------------------------------------------------------  Chemistries  Recent Labs  Lab 01/29/18 0746  NA 137  K 4.4  CL 94*  CO2 25  GLUCOSE 99  BUN 34*  CREATININE 7.68*  CALCIUM 10.0  AST 40  ALT 10  ALKPHOS 76  BILITOT 0.9   ------------------------------------------------------------------------------------------------------------------  Cardiac Enzymes Recent Labs  Lab 01/29/18 2257  TROPONINI 0.07*   ------------------------------------------------------------------------------------------------------------------  RADIOLOGY:  No results found.   ASSESSMENT AND PLAN:   67 year old male with past medical history of hypertension, hyperlipidemia, gout, peripheral vascular disease, stage renal disease on hemodialysis, anxiety who presents to the hospital due to chest pain.  1.  Chest pain- patient symptoms are somewhat atypical and his pain has resolved.  He does have significant cardiac risk factors therefore was observed in the hospital overnight.  Patient's troponins have remained flat. - Seen by cardiology and no plans for acute intervention.  They did not appreciate any new EKG changes. -Continue aspirin, atorvastatin, metoprolol, Plavix  2.  End-stage renal disease on hemodialysis- patient gets dialysis on Monday Wednesday and Friday.  Pt. Had HD today and tolerated it well.   3.  Lower extremity ulcerations on the right foot-secondary to peripheral artery disease. -Seen by wound  care and continue local wound  care for now. Pt. Having some pain and treated with IV morphine, oral Norco.    4.  Essential hypertension-continue metoprolol, hydralazine, Avapro, Norvasc  5.  History of gout-no acute attack.  Continue colchicine.  Pt. Refusing to work with PT.  Called pt's POA and was notified that pt. Is from [facility] and will go back there.  Discussed w/ Social work and they will find out if [facility] can take him back.    All the records are reviewed and case discussed with Care Management/Social Worker. Management plans discussed with the patient, family and they are in agreement.  CODE STATUS: full code  DVT Prophylaxis: hep. SQ  TOTAL TIME TAKING CARE OF THIS PATIENT: 35 minutes.   POSSIBLE D/C IN 1-2 DAYS, DEPENDING ON CLINICAL CONDITION.  Greater than 50% of time spent in coordination of care and discussing with social work, Transport planner.  Henreitta Leber M.D on 01/31/2018 at 3:17 PM  Between 7am to 6pm - Pager - [phone]  After 6pm go to [email or web address] - Proofreader  [facility]  Office  [phone]  CC: Primary care physician; [name], MD

## 2018-01-31 NOTE — Progress Notes (Signed)
Pre HD assessment    01/31/18 0852  Neurological  Level of Consciousness Alert  Orientation Level Oriented to person;Oriented to place;Disoriented to time;Disoriented to situation  Respiratory  Respiratory Pattern Regular;Unlabored  Chest Assessment Chest expansion symmetrical  Cardiac  ECG Monitor Yes  Cardiac Rhythm NSR  Vascular  R Radial Pulse +2  L Radial Pulse +1  Edema Generalized  Integumentary  Integumentary (WDL) X  Skin Color Appropriate for ethnicity  Skin Condition Flaky;Dry  Musculoskeletal  Musculoskeletal (WDL) X  Generalized Weakness Yes  Assistive Device None  GU Assessment  Genitourinary (WDL) X  Genitourinary Symptoms  (HD)  Psychosocial  Psychosocial (WDL) X  Patient Behaviors Cooperative;Calm;Irritable  Needs Expressed Physical;Educational  Emotional support given Given to patient

## 2018-01-31 NOTE — Plan of Care (Signed)
  Problem: Health Behavior/Discharge Planning: Goal: Ability to manage health-related needs will improve Outcome: Not Progressing   Problem: Activity: Goal: Risk for activity intolerance will decrease Outcome: Not Progressing   Problem: Nutrition: Goal: Adequate nutrition will be maintained Outcome: Not Progressing   

## 2018-01-31 NOTE — Progress Notes (Signed)
Post HD assessment. Pt tolerated tx well without c/o or complication. Net UF 5, goal met.    01/31/18 1251  Vital Signs  Temp 97.6 F (36.4 C)  Temp Source Oral  Pulse Rate 91  Pulse Rate Source Monitor  Resp 15  BP (!) 176/79  BP Location Right Arm  BP Method Automatic  Patient Position (if appropriate) Lying  Oxygen Therapy  SpO2 98 %  O2 Device Room Air  Dialysis Weight  Weight 62 kg  Type of Weight Post-Dialysis  Post-Hemodialysis Assessment  Rinseback Volume (mL) 250 mL  KECN 79.8 V  Dialyzer Clearance Lightly streaked  Duration of HD Treatment -hour(s) 3.5 hour(s)  Hemodialysis Intake (mL) 500 mL  UF Total -Machine (mL) 505 mL  Net UF (mL) 5 mL  Tolerated HD Treatment Yes  AVG/AVF Arterial Site Held (minutes) 10 minutes  AVG/AVF Venous Site Held (minutes) 10 minutes  Education / Care Plan  Dialysis Education Provided Yes  Documented Education in Care Plan Yes  Fistula / Graft Left Upper arm  No Placement Date or Time found.   Orientation: Left  Access Location: Upper arm  Site Condition No complications  Fistula / Graft Assessment Present;Thrill;Bruit  Status Deaccessed  Drainage Description None

## 2018-01-31 NOTE — Progress Notes (Signed)
[facility], [place] 01/31/18  Subjective:   Patient sent from HD unit for chest pain At present it is resolved Work up so far shows CT chest is negative Cardiology evaluation - medical management  Appetite remains poor   HEMODIALYSIS FLOWSHEET:  Blood Flow Rate (mL/min): 400 mL/min Arterial Pressure (mmHg): -180 mmHg Venous Pressure (mmHg): 180 mmHg Transmembrane Pressure (mmHg): 50 mmHg Ultrafiltration Rate (mL/min): 140 mL/min Dialysate Flow Rate (mL/min): 800 ml/min Conductivity: Machine : 13.5 Conductivity: Machine : 13.5 Dialysis Fluid Bolus: Normal Saline Bolus Amount (mL): 250 mL    Objective:  Vital signs in last 24 hours:  Temp:  [97.5 F (36.4 C)-98.9 F (37.2 C)] 97.5 F (36.4 C) (09/25 0851) Pulse Rate:  [84-98] 91 (09/25 1100) Resp:  [11-23] 17 (09/25 1100) BP: (135-185)/(64-91) 151/84 (09/25 1100) SpO2:  [95 %-100 %] 100 % (09/25 1100) Weight:  [60.6 kg-62.1 kg] 62.1 kg (09/25 0851)  Weight change: -5.216 kg Filed Weights   01/29/18 2145 01/31/18 0420 01/31/18 0851  Weight: 60.2 kg 60.6 kg 62.1 kg    Intake/Output:    Intake/Output Summary (Last 24 hours) at 01/31/2018 1111 Last data filed at 01/31/2018 0426 Gross per 24 hour  Intake 605 ml  Output 0 ml  Net 605 ml     Physical Exam: General: NAD, chronically ill appearing  HEENT Anicteric, moist oral mucus membranes  Neck supple  Pulm/lungs Clear b/l  CVS/Heart No rub  Abdomen:  Soft, NT  Extremities: Trace dependent edema, rt foot 5th toe necrotic  Neurologic: Alert, follows commands  Skin: No acute rashes  Access: Left arm AVG       Basic Metabolic Panel:  Recent Labs  Lab 01/29/18 0746 01/29/18 1617  NA 137  --   K 4.4  --   CL 94*  --   CO2 25  --   GLUCOSE 99  --   BUN 34*  --   CREATININE 7.68*  --   CALCIUM 10.0  --   PHOS  --  3.3     CBC: Recent Labs  Lab 01/29/18 0746 01/30/18 0937  WBC 26.0* 10.4  HGB 12.3* 11.1*  HCT  36.9* 33.9*  MCV 95.0 95.0  PLT 361 318      Lab Results  Component Value Date   HEPBSAG Negative 09/30/2015   HEPBSAB Reactive 01/29/2018      Microbiology:  Recent Results (from the past 240 hour(s))  Culture, blood (Routine X 2) w Reflex to ID Panel     Status: None (Preliminary result)   Collection Time: 01/29/18 10:56 PM  Result Value Ref Range Status   Specimen Description BLOOD BLOOD RIGHT HAND  Final   Special Requests   Final    BOTTLES DRAWN AEROBIC AND ANAEROBIC Blood Culture results may not be optimal due to an inadequate volume of blood received in culture bottles   Culture   Final    NO GROWTH 2 DAYS Performed at [facility], [address], [place], [place] [address]    Report Status PENDING  Incomplete  Culture, blood (Routine X 2) w Reflex to ID Panel     Status: None (Preliminary result)   Collection Time: 01/29/18 10:58 PM  Result Value Ref Range Status   Specimen Description BLOOD BLOOD RIGHT HAND  Final   Special Requests   Final    BOTTLES DRAWN AEROBIC AND ANAEROBIC Blood Culture results may not be optimal due to an inadequate volume of blood received  in culture bottles   Culture   Final    NO GROWTH 2 DAYS Performed at [facility], [address] [place], [place] [address]    Report Status PENDING  Incomplete    Coagulation Studies: No results for input(s): LABPROT, INR in the last 72 hours.  Urinalysis: No results for input(s): COLORURINE, LABSPEC, PHURINE, GLUCOSEU, HGBUR, BILIRUBINUR, KETONESUR, PROTEINUR, UROBILINOGEN, NITRITE, LEUKOCYTESUR in the last 72 hours.  Invalid input(s): APPERANCEUR    Imaging: No results found.   Medications:    . allopurinol  100 mg Oral Daily  . amLODipine  10 mg Oral Daily  . aspirin EC  81 mg Oral Daily  . atorvastatin  10 mg Oral Daily  . calcium carbonate  200 mg of elemental calcium Oral TID  . clopidogrel  75 mg Oral Daily  . feeding supplement (NEPRO CARB STEADY)  237  mL Oral BID BM  . furosemide  40 mg Oral Daily  . heparin injection (subcutaneous)  5,000 Units Subcutaneous Q8H  . hydrALAZINE  25 mg Oral Daily  . irbesartan  300 mg Oral Daily  . metoprolol succinate  25 mg Oral QHS  . multivitamin  1 tablet Oral QHS  . multivitamin-lutein  1 capsule Oral Daily   acetaminophen, clonazepam, colchicine, HYDROcodone-acetaminophen, ondansetron (ZOFRAN) IV, polyethylene glycol  Assessment/ Plan:  67 y.o.African American male with end stage renal disease on hemodialysis, congestive heart failure, hypertension, peripheral vascular disease  [place]. MWF L AVF  61kg  1.  Uremia, End-stage renal disease 2.  Anemia of chronic kidney disease- EPO with HD 3.  Secondary hyperparathyroidism- Continue home dose of binders with meals 4.  Chest pain 5.  Multiple arterial and pressure ulcers on b/l feet 6. HTN  Patient seen during dialysis Tolerating well  Wounds assessed by wound care nurse BP control is variable. Continue home meds We will follow    LOS: 0 Johnny [name] Johnny [name] 9/25/201911:11 AM  [place], [place]  Note: This note was prepared with Dragon dictation. Any transcription errors are unintentional

## 2018-01-31 NOTE — Progress Notes (Signed)
HD tx end    01/31/18 1240  Vital Signs  Pulse Rate 95  Pulse Rate Source Monitor  Resp 20  BP (!) 174/83  BP Location Right Arm  BP Method Automatic  Patient Position (if appropriate) Lying  Oxygen Therapy  SpO2 100 %  O2 Device Room Air  During Hemodialysis Assessment  Dialysis Fluid Bolus Normal Saline  Bolus Amount (mL) 250 mL  Intra-Hemodialysis Comments Tx completed

## 2018-01-31 NOTE — Clinical Social Work Note (Signed)
Physician is ready to discharge patient today however, patient is [employer] and will require re-auth. Patient can return once re-auth received. Shela Leff MSW,LCSW [phone]

## 2018-01-31 NOTE — Progress Notes (Signed)
PT Cancellation Note  Patient Details Name: Johnny [name]. MRN: [id number] DOB: Jul 04, 1950   Cancelled Treatment:    Reason Eval/Treat Not Completed: Patient declined, no reason specified.  Pt refused to work with therapy.  This therapist explained the role of therapy and the importance of not delaying mobility; however, the pt continued to adamantly refuse and became upset and verbally rude toward this therapist.      Collie Siad PT, DPT 01/31/2018, 2:21 PM

## 2018-01-31 NOTE — Progress Notes (Signed)
Pre HD assessment    01/31/18 0851  Vital Signs  Temp (!) 97.5 F (36.4 C)  Temp Source Oral  Pulse Rate 94  Pulse Rate Source Monitor  Resp 14  BP (!) 159/87  BP Location Right Arm  BP Method Automatic  Patient Position (if appropriate) Lying  Oxygen Therapy  SpO2 100 %  O2 Device Room Air  Pain Assessment  Pain Scale 0-10  Pain Score 0  Dialysis Weight  Weight 62.1 kg  Type of Weight Pre-Dialysis  Time-Out for Hemodialysis  What Procedure? HD  Pt Identifiers(min of two) First/Last Name;MRN/Account#  Correct Site? Yes  Correct Side? Yes  Correct Procedure? Yes  Consents Verified? Yes  Rad Studies Available? N/A  Safety Precautions Reviewed? Yes  Engineer, civil (consulting) Number  ([id number])  Station Number 1  UF/Alarm Test Passed  Conductivity: Meter 13.6  Conductivity: Machine  13.9  pH 7.6  Reverse Osmosis main  Normal Saline Lot Number [id number]  Dialyzer Lot Number [id number]  Disposable Set Lot Number [id number]  Machine Temperature 98.6 F (37 C)  Musician and Audible Yes  Blood Lines Intact and Secured Yes  Pre Treatment Patient Checks  Vascular access used during treatment Graft  Hepatitis B Surface Antigen Results Negative  Date Hepatitis B Surface Antigen Drawn 09/30/15  Hepatitis B Surface Antibody  (>10)  Date Hepatitis B Surface Antibody Drawn 01/29/18  Hemodialysis Consent Verified Yes  Hemodialysis Standing Orders Initiated Yes  ECG (Telemetry) Monitor On Yes  Prime Ordered Normal Saline  Length of  DialysisTreatment -hour(s) 3.5 Hour(s)  Dialyzer Elisio 17H NR  Dialysate 3K, 2.5 Ca  Dialysis Anticoagulant None  Dialysate Flow Ordered 800  Blood Flow Rate Ordered 400 mL/min  Ultrafiltration Goal 0 Liters  Dialysis Blood Pressure Support Ordered Normal Saline  Education / Care Plan  Dialysis Education Provided Yes  Documented Education in Care Plan Yes  Fistula / Graft Left Upper arm  No Placement Date or Time found.   Orientation: Left   Access Location: Upper arm  Site Condition No complications  Fistula / Graft Assessment Present;Thrill;Bruit  Drainage Description None

## 2018-01-31 NOTE — Progress Notes (Signed)
HD tx start    01/31/18 0903  Vital Signs  Pulse Rate 88  Pulse Rate Source Monitor  Resp 17  BP (!) 171/83  BP Location Right Arm  BP Method Automatic  Patient Position (if appropriate) Lying  Oxygen Therapy  SpO2 100 %  O2 Device Room Air  During Hemodialysis Assessment  Blood Flow Rate (mL/min) 400 mL/min  Arterial Pressure (mmHg) -160 mmHg  Venous Pressure (mmHg) 190 mmHg  Transmembrane Pressure (mmHg) 50 mmHg  Ultrafiltration Rate (mL/min) 140 mL/min  Dialysate Flow Rate (mL/min) 800 ml/min  Conductivity: Machine  13.6  HD Safety Checks Performed Yes  Dialysis Fluid Bolus Normal Saline  Bolus Amount (mL) 250 mL  Intra-Hemodialysis Comments Tx initiated  Fistula / Graft Left Upper arm  No Placement Date or Time found.   Orientation: Left  Access Location: Upper arm  Status Accessed  Needle Size 15

## 2018-01-31 NOTE — Progress Notes (Signed)
Post HD assessment    01/31/18 1249  Neurological  Level of Consciousness Alert  Orientation Level Oriented to person;Oriented to place;Disoriented to time;Disoriented to situation  Respiratory  Respiratory Pattern Regular;Unlabored  Chest Assessment Chest expansion symmetrical  Cardiac  ECG Monitor Yes  Cardiac Rhythm NSR  Vascular  R Radial Pulse +2  L Radial Pulse +1  Edema Generalized  Integumentary  Integumentary (WDL) X  Skin Color Appropriate for ethnicity  Skin Condition Flaky;Dry  Musculoskeletal  Musculoskeletal (WDL) X  Generalized Weakness Yes  Assistive Device None  GU Assessment  Genitourinary (WDL) X  Genitourinary Symptoms  (HD)  Psychosocial  Psychosocial (WDL) WDL  Patient Behaviors Cooperative;Calm  Needs Expressed Physical  Emotional support given Given to patient

## 2018-02-01 MED ORDER — HYDROCODONE-ACETAMINOPHEN 5-325 MG PO TABS: 1.0000 | ORAL_TABLET | ORAL | 0 refills | Status: DC | PRN

## 2018-02-01 NOTE — Progress Notes (Signed)
[facility], [place] 02/01/18  Subjective:   Patient sent from HD unit for chest pain, Work up so far shows CT chest is negative Cardiology evaluation - medical management Reports pain in the feet today Denies SOB    Objective:  Vital signs in last 24 hours:  Temp:  [97.7 F (36.5 C)-98.4 F (36.9 C)] 97.7 F (36.5 C) (09/26 0601) Pulse Rate:  [93-99] 93 (09/26 0603) BP: (110-183)/(61-94) 183/92 (09/26 0603) SpO2:  [99 %-100 %] 100 % (09/26 0603) Weight:  [59.6 kg] 59.6 kg (09/26 0603)  Weight change: 1.545 kg Filed Weights   01/31/18 0851 01/31/18 1251 02/01/18 0603  Weight: 62.1 kg 62 kg 59.6 kg    Intake/Output:    Intake/Output Summary (Last 24 hours) at 02/01/2018 1256 Last data filed at 02/01/2018 0900 Gross per 24 hour  Intake 360 ml  Output 50 ml  Net 310 ml     Physical Exam: General: NAD, chronically ill appearing  HEENT Anicteric, moist oral mucus membranes  Neck supple  Pulm/lungs Clear b/l  CVS/Heart No rub  Abdomen:  Soft, NT  Extremities: Trace dependent edema, rt foot 5th toe necrotic  Neurologic: Alert, follows commands  Skin: No acute rashes  Access: Left arm AVG       Basic Metabolic Panel:  Recent Labs  Lab 01/29/18 0746 01/29/18 1617  NA 137  --   K 4.4  --   CL 94*  --   CO2 25  --   GLUCOSE 99  --   BUN 34*  --   CREATININE 7.68*  --   CALCIUM 10.0  --   PHOS  --  3.3     CBC: Recent Labs  Lab 01/29/18 0746 01/30/18 0937  WBC 26.0* 10.4  HGB 12.3* 11.1*  HCT 36.9* 33.9*  MCV 95.0 95.0  PLT 361 318      Lab Results  Component Value Date   HEPBSAG Negative 09/30/2015   HEPBSAB Reactive 01/29/2018      Microbiology:  Recent Results (from the past 240 hour(s))  Culture, blood (Routine X 2) w Reflex to ID Panel     Status: None (Preliminary result)   Collection Time: 01/29/18 10:56 PM  Result Value Ref Range Status   Specimen Description BLOOD BLOOD RIGHT HAND  Final   Special Requests   Final    BOTTLES DRAWN AEROBIC AND ANAEROBIC Blood Culture results may not be optimal due to an inadequate volume of blood received in culture bottles   Culture   Final    NO GROWTH 3 DAYS Performed at [facility], [address], [place], [place] [address]    Report Status PENDING  Incomplete  Culture, blood (Routine X 2) w Reflex to ID Panel     Status: None (Preliminary result)   Collection Time: 01/29/18 10:58 PM  Result Value Ref Range Status   Specimen Description BLOOD BLOOD RIGHT HAND  Final   Special Requests   Final    BOTTLES DRAWN AEROBIC AND ANAEROBIC Blood Culture results may not be optimal due to an inadequate volume of blood received in culture bottles   Culture   Final    NO GROWTH 3 DAYS Performed at [facility], [address] [place],  [address]    Report Status PENDING  Incomplete    Coagulation Studies: No results for input(s): LABPROT, INR in the last 72 hours.  Urinalysis: No results for input(s): COLORURINE, LABSPEC, [place], [place], [place], [place],  KETONESUR, PROTEINUR, UROBILINOGEN, NITRITE, LEUKOCYTESUR in the last 72 hours.  Invalid input(s): APPERANCEUR    Imaging: No results found.   Medications:    . allopurinol  100 mg Oral Daily  . amLODipine  10 mg Oral Daily  . aspirin EC  81 mg Oral Daily  . atorvastatin  10 mg Oral Daily  . calcium carbonate  200 mg of elemental calcium Oral TID  . clopidogrel  75 mg Oral Daily  . feeding supplement (NEPRO CARB STEADY)  237 mL Oral BID BM  . furosemide  40 mg Oral Daily  . heparin injection (subcutaneous)  5,000 Units Subcutaneous Q8H  . hydrALAZINE  25 mg Oral Daily  . irbesartan  300 mg Oral Daily  . metoprolol succinate  25 mg Oral QHS  . multivitamin  1 tablet Oral QHS  . multivitamin-lutein  1 capsule Oral Daily  . sodium chloride flush  3 mL Intravenous Q12H   acetaminophen, clonazepam, colchicine, HYDROcodone-acetaminophen,  ondansetron (ZOFRAN) IV, polyethylene glycol  Assessment/ Plan:  67 y.o.African American male with end stage renal disease on hemodialysis, congestive heart failure, hypertension, peripheral vascular disease  [place]. MWF L AVF  61kg  1.  Uremia, End-stage renal disease 2.  Anemia of chronic kidney disease- EPO with HD 3.  Secondary hyperparathyroidism- Continue home dose of binders with meals 4.  Chest pain 5.  Multiple arterial and pressure ulcers on b/l feet 6.  HTN  Patient expected to be discharged. Next HD as outpatient tomorrow Wounds assessed by wound care nurse BP control is variable. Continue home meds We will follow    LOS: 0 Rocko Fesperman Candiss Norse 9/26/201912:56 [facility] [place], [place]  Note: This note was prepared with Dragon dictation. Any transcription errors are unintentional

## 2018-02-01 NOTE — Clinical Social Work Note (Signed)
Clinical Social Work Assessment  Patient Details  Name: Johnny [name]. MRN: [id number] Date of Birth: [birth year]  Date of referral:  02/01/18               Reason for consult:  Other (Comment Required)(From [facility] )                Permission sought to share information with:  Facility Art therapist granted to share information::  Yes, Verbal Permission Granted  Name::      [facility]   Agency::     Relationship::     Contact Information:     Housing/Transportation Living arrangements for the past 2 months:  [place], [place] of Information:  Patient, Friend/Neighbor Patient Interpreter Needed:  None Criminal Activity/Legal Involvement Pertinent to Current Situation/Hospitalization:  No - Comment as needed Significant Relationships:  Friend Lives with:  Self, Facility Resident Do you feel safe going back to the place where you live?  Yes Need for family participation in patient care:  Yes (Comment)  Care giving concerns:  Patient is a short term rehab resident at [facility].    Social Worker assessment / plan:  Holiday representative (CSW) reviewed chart and noted that patient is from [facility]. Per [name] [facility] liaison patient can return to [facility] once she gets [facility] SN[facility] authorization. Per [name] she started authorization yesterday. CSW met with patient alone at bedside and made him aware of above. Patient is agreeable to return to [facility]. Per patient he was living alone in [place] prior to going to [facility].   Per [facility] authorization has been received and patient can come today to room 706. Patient is medically stable for D/C to [facility] today. RN will call report and arrange EMS for transport. CSW sent D/C orders to [facility] via [facility]. Patient is aware of above. CSW contacted patient's friend [name] and made her aware of above. Please reconsult if future social work needs arise. CSW signing off.   Employment status:  Disabled (Comment on  whether or not currently receiving Disability), Retired Nurse, adult PT Recommendations:  [place] / Referral to community resources:  [place]  Patient/Family's Response to care:  Patient is agreeable to D/C to [facility] today.   Patient/Family's Understanding of and Emotional Response to Diagnosis, Current Treatment, and Prognosis:  Patient was very pleasant and thanked CSW for assistance.   Emotional Assessment Appearance:  Appears stated age Attitude/Demeanor/Rapport:    Affect (typically observed):  Accepting, Adaptable, Pleasant Orientation:  Oriented to Self, Oriented to Place, Fluctuating Orientation (Suspected and/or reported Sundowners) Alcohol / Substance use:  Not Applicable Psych involvement (Current and /or in the community):  No (Comment)  Discharge Needs  Concerns to be addressed:  No discharge needs identified Readmission within the last 30 days:  No Current discharge risk:  None Barriers to Discharge:  No Barriers Identified   [name], [name], LCSW 02/01/2018, 4:35 PM

## 2018-02-01 NOTE — Discharge Summary (Signed)
[place] [facility] [place] NAME: Johnny [name]    MR#:  [id number]  DATE OF BIRTH:  05/23/1950  DATE OF ADMISSION:  01/29/2018 ADMITTING PHYSICIAN: Nicholes Mango, MD  DATE OF DISCHARGE: 02/01/2018  PRIMARY CARE PHYSICIAN: [name], MD    ADMISSION DIAGNOSIS:  Chest pain, unspecified type [R07.9]  DISCHARGE DIAGNOSIS:  Active Problems:   Chest pain   SECONDARY DIAGNOSIS:   Past Medical History:  Diagnosis Date  . Anemia   . Anxiety   . CHF (congestive heart failure) ([place])   . Chronic kidney disease   . Gout   . Hyperlipidemia   . Hypertension   . Peripheral vascular disease [facility])     HOSPITAL COURSE:   67 year old male with past medical history of hypertension, hyperlipidemia, gout, peripheral vascular disease, stage renal disease on hemodialysis, anxiety who presents to the hospital due to chest pain.  1.  Chest pain- patient symptoms were somewhat atypical and they have resolved.  He did have significant cardiac risk factors therefore was observed in the hospital.   Patient's troponins have remained flat. - He was Seen by cardiology and no plans for acute intervention.  They did not appreciate any new EKG changes. -pt. Will Continue aspirin, atorvastatin, metoprolol, Plavix  2.  End-stage renal disease on hemodialysis- patient gets dialysis on Monday Wednesday and Friday. - Patient received hemodialysis on his schedule days as mentioned above and will resume that.  3.  Lower extremity ulcerations on the right foot-secondary to peripheral artery disease. -Seen by wound care and continue local wound care for now.   Continue oral Norco as needed for pain.    4.  Essential hypertension-pt. Will continue metoprolol, hydralazine, Avapro, Norvasc  5.  History of gout-no acute attack. He will Continue colchicine.  Pt. Is being discharged back to SNF today.   DISCHARGE CONDITIONS:   Stable.   CONSULTS OBTAINED:  Treatment  Team:  Murlean Iba, MD  DRUG ALLERGIES:   Allergies  Allergen Reactions  . Shellfish Allergy Anaphylaxis    DISCHARGE MEDICATIONS:   Allergies as of 02/01/2018      Reactions   Shellfish Allergy Anaphylaxis      Medication List    STOP taking these medications   acetaminophen 325 MG tablet Commonly known as:  TYLENOL   aspirin EC 81 MG tablet   calcium carbonate 500 MG chewable tablet Commonly known as:  TUMS - dosed in mg elemental calcium   oxyCODONE-acetaminophen 5-325 MG tablet Commonly known as:  PERCOCET/ROXICET     TAKE these medications   allopurinol 100 MG tablet Commonly known as:  ZYLOPRIM Take 100 mg by mouth daily.   amLODipine 10 MG tablet Commonly known as:  NORVASC Take 10 mg by mouth daily.   atorvastatin 10 MG tablet Commonly known as:  LIPITOR Take 1 tablet by mouth daily.   clonazePAM 0.5 MG tablet Commonly known as:  KLONOPIN Take 0.5 tablets (0.25 mg total) by mouth 2 (two) times daily as needed (insomnia).   clopidogrel 75 MG tablet Commonly known as:  PLAVIX Take 1 tablet (75 mg total) by mouth daily.   colchicine 0.6 MG tablet Take 0.6 mg by mouth daily as needed (for gout flares).   feeding supplement (NEPRO CARB STEADY) Liqd Take 237 mLs by mouth 2 (two) times daily between meals.   folic acid-vitamin b complex-vitamin c-selenium-zinc 3 MG Tabs tablet Take by mouth.   furosemide 40 MG tablet Commonly known  as:  LASIX Take 40 mg by mouth daily.   hydrALAZINE 25 MG tablet Commonly known as:  APRESOLINE Take 25 mg by mouth daily.   HYDROcodone-acetaminophen 5-325 MG tablet Commonly known as:  NORCO/VICODIN Take 1 tablet by mouth every 4 (four) hours as needed for moderate pain.   irbesartan 300 MG tablet Commonly known as:  AVAPRO Take 300 mg by mouth daily.   metoprolol succinate 25 MG 24 hr tablet Commonly known as:  TOPROL-XL Take 1 tablet (25 mg total) by mouth at bedtime.   multivitamin Tabs tablet Take  1 tablet by mouth at bedtime.   multivitamin-lutein Caps capsule Take 1 capsule by mouth daily.   polyethylene glycol packet Commonly known as:  MIRALAX / GLYCOLAX Take 17 g by mouth daily as needed for mild constipation.         DISCHARGE INSTRUCTIONS:   DIET:  Cardiac diet and Renal diet  DISCHARGE CONDITION:  Stable  ACTIVITY:  Activity as tolerated  OXYGEN:  Home Oxygen: No.   Oxygen Delivery: room air  DISCHARGE LOCATION:  nursing home   If you experience worsening of your admission symptoms, develop shortness of breath, life threatening emergency, suicidal or homicidal thoughts you must seek medical attention immediately by calling 911 or calling your MD immediately  if symptoms less severe.  You Must read complete instructions/literature along with all the possible adverse reactions/side effects for all the Medicines you take and that have been prescribed to you. Take any new Medicines after you have completely understood and accpet all the possible adverse reactions/side effects.   Please note  You were cared for by a hospitalist during your hospital stay. If you have any questions about your discharge medications or the care you received while you were in the hospital after you are discharged, you can call the unit and asked to speak with the hospitalist on call if the hospitalist that took care of you is not available. Once you are discharged, your primary care physician will handle any further medical issues. Please note that NO REFILLS for any discharge medications will be authorized once you are discharged, as it is imperative that you return to your primary care physician (or establish a relationship with a primary care physician if you do not have one) for your aftercare needs so that they can reassess your need for medications and monitor your lab values.     Today   No acute events overnight, denies any chest pains or shortness of breath.  He does  complain of lower extremity/foot pain secondary to his ulcers.  Patient worked with physical therapy today.  We have received insurance authorization the patient is being discharged back to his rehab.  VITAL SIGNS:  Blood pressure (!) 183/92, pulse 93, temperature 97.7 F (36.5 C), temperature source Oral, resp. rate (!) 21, height 5\' 8"  (1.727 m), weight 59.6 kg, SpO2 100 %.  I/O:    Intake/Output Summary (Last 24 hours) at 02/01/2018 1523 Last data filed at 02/01/2018 0900 Gross per 24 hour  Intake 360 ml  Output 50 ml  Net 310 ml    PHYSICAL EXAMINATION:   GENERAL:  67 y.o.-year-old patient lying in bed lethargic but follows simple commands.   EYES: Pupils equal, round, reactive to light and accommodation. No scleral icterus. Extraocular muscles intact.  HEENT: Head atraumatic, normocephalic. Oropharynx and nasopharynx clear.  NECK:  Supple, no jugular venous distention. No thyroid enlargement, no tenderness.  LUNGS: Normal breath sounds bilaterally, no wheezing,  rales, rhonchi. No use of accessory muscles of respiration.  CARDIOVASCULAR: S1, S2 normal. No murmurs, rubs, or gallops.  ABDOMEN: Soft, nontender, nondistended. Bowel sounds present. No organomegaly or mass.  EXTREMITIES: No cyanosis, clubbing or edema b/l.    NEUROLOGIC: Cranial nerves II through XII are intact. No focal Motor or sensory deficits b/l. Globally weak   PSYCHIATRIC: The patient is alert and oriented x 3.  SKIN: No obvious rash, lesion, or ulcer. B/l LE/foot ulcers and they are wrapped in kerlex  Left Upper extremity AV fistula with good bruit and thrill.  DATA REVIEW:   CBC Recent Labs  Lab 01/30/18 0937  WBC 10.4  HGB 11.1*  HCT 33.9*  PLT 318    Chemistries  Recent Labs  Lab 01/29/18 0746  NA 137  K 4.4  CL 94*  CO2 25  GLUCOSE 99  BUN 34*  CREATININE 7.68*  CALCIUM 10.0  AST 40  ALT 10  ALKPHOS 76  BILITOT 0.9    Cardiac Enzymes Recent Labs  Lab 01/29/18 2257  TROPONINI  0.07*    Microbiology Results  Results for orders placed or performed during the hospital encounter of 01/29/18  Culture, blood (Routine X 2) w Reflex to ID Panel     Status: None (Preliminary result)   Collection Time: 01/29/18 10:56 PM  Result Value Ref Range Status   Specimen Description BLOOD BLOOD RIGHT HAND  Final   Special Requests   Final    BOTTLES DRAWN AEROBIC AND ANAEROBIC Blood Culture results may not be optimal due to an inadequate volume of blood received in culture bottles   Culture   Final    NO GROWTH 3 DAYS Performed at [facility], [address], [place], [place] [address]    Report Status PENDING  Incomplete  Culture, blood (Routine X 2) w Reflex to ID Panel     Status: None (Preliminary result)   Collection Time: 01/29/18 10:58 PM  Result Value Ref Range Status   Specimen Description BLOOD BLOOD RIGHT HAND  Final   Special Requests   Final    BOTTLES DRAWN AEROBIC AND ANAEROBIC Blood Culture results may not be optimal due to an inadequate volume of blood received in culture bottles   Culture   Final    NO GROWTH 3 DAYS Performed at [facility], [address] [place], [place] [address]    Report Status PENDING  Incomplete    RADIOLOGY:  No results found.    Management plans discussed with the patient, family and they are in agreement.  CODE STATUS:     Code Status Orders  (From admission, onward)         Start     Ordered   01/29/18 1317  Full code  Continuous     01/29/18 1316      Advance Directive Documentation     Most Recent Value  Type of Advance Directive  Healthcare Power of Attorney  Pre-existing out of facility DNR order (yellow form or pink MOST form)  -  "MOST" Form in Place?  -      TOTAL TIME TAKING CARE OF THIS PATIENT: 40 minutes.    Henreitta Leber M.D on 02/01/2018 at 3:23 PM  Between 7am to 6pm - Pager - [phone]  After 6pm go to [email or web address] - Proofreader  [employer]  [facility]  Office  [phone]  CC: Primary care physician; [name], MD

## 2018-02-01 NOTE — Plan of Care (Signed)
Patient complained of feet pain before and after dressing change. Patient refuses any solid food, encourage frequent snacks throughout the day. Patient will drink juice. Dietary consult will be placed.

## 2018-02-01 NOTE — NC FL2 (Addendum)
[place] LEVEL OF CARE SCREENING TOOL     IDENTIFICATION  Patient Name: Johnny [name]. Birthdate: 11-Jun-1950 Sex: male Admission Date (Current Location): 01/29/2018  [place] and [place] Number:  Engineering geologist and Address:  [facility], [address] [place], [place] [address]      Provider Number: [id number]  Attending Physician Name and Address:  Henreitta Leber, MD  Relative Name and Phone Number:       Current Level of Care: Hospital Recommended Level of Care: [place] Prior Approval Number:    Date Approved/Denied:   PASRR Number: [id number])  Discharge Plan: SNF    Current Diagnoses: Patient Active Problem List   Diagnosis Date Noted  . Weakness of right lower extremity 01/20/2018  . Fever   . Periumbilical abdominal pain   . Confusion 12/22/2017  . Acute delirium 12/21/2017  . Protein-calorie malnutrition, severe 12/19/2017  . Intractable nausea and vomiting 12/18/2017  . Lymphedema 12/13/2017  . Cellulitis 11/27/2017  . Chest pain 11/19/2017  . Atherosclerosis of native arteries of the extremities with ulceration ([place]) 11/07/2017  . Elevated troponin 10/02/2015  . Complications, dialysis, catheter, mechanical ([place]) 10/02/2015  . Musculoskeletal chest pain 09/28/2015  . ESRD on dialysis ([place]) 09/28/2015  . HTN (hypertension) 09/28/2015  . Chronic diastolic CHF (congestive heart failure) ([place]) 09/28/2015  . Gout 09/28/2015    Orientation RESPIRATION BLADDER Height & Weight     Self, Time, Situation, Place  Normal Continent Weight: 131 lb 4.8 oz (59.6 kg) Height:  5\' 8"  (172.7 cm)  BEHAVIORAL SYMPTOMS/MOOD NEUROLOGICAL BOWEL NUTRITION STATUS      Continent Diet(Diet: Renal )  AMBULATORY STATUS COMMUNICATION OF NEEDS Skin   Extensive Assist Verbally Other (Comment)(ulcers legs and feet)                       Personal Care Assistance Level of Assistance  Bathing, Feeding,  Dressing Bathing Assistance: Limited assistance Feeding assistance: Independent Dressing Assistance: Limited assistance     Functional Limitations Info  Sight, Hearing, Speech Sight Info: Adequate Hearing Info: Adequate Speech Info: Adequate    SPECIAL CARE FACTORS FREQUENCY  PT (By licensed PT), OT (By licensed OT)   Dialysis [facility] [facility]. MWF      PT Frequency: (5) OT Frequency: (5)            Contractures      Additional Factors Info  Code Status, Allergies, Isolation Precautions Code Status Info: (Full Code. ) Allergies Info: (Shellfish Allergy)     Isolation Precautions Info: (MRSA Nasal Swab. )     Current Medications (02/01/2018):  This is the current hospital active medication list Current Facility-Administered Medications  Medication Dose Route Frequency Provider Last Rate Last Dose  . acetaminophen (TYLENOL) tablet 650 mg  650 mg Oral Q6H PRN Gouru, Aruna, MD   650 mg at 01/31/18 1140  . allopurinol (ZYLOPRIM) tablet 100 mg  100 mg Oral Daily Gouru, Aruna, MD   100 mg at 02/01/18 0918  . amLODipine (NORVASC) tablet 10 mg  10 mg Oral Daily Gouru, Aruna, MD   10 mg at 02/01/18 0918  . aspirin EC tablet 81 mg  81 mg Oral Daily Gouru, Aruna, MD   81 mg at 02/01/18 0919  . atorvastatin (LIPITOR) tablet 10 mg  10 mg Oral Daily Gouru, Aruna, MD   10 mg at 02/01/18 0919  . calcium carbonate (TUMS - dosed in mg elemental calcium)  chewable tablet 200 mg of elemental calcium  200 mg of elemental calcium Oral TID Gouru, Aruna, MD   200 mg of elemental calcium at 02/01/18 1507  . clonazePAM (KLONOPIN) disintegrating tablet 0.25 mg  0.25 mg Oral BID PRN Gouru, Aruna, MD   0.25 mg at 01/30/18 2308  . clopidogrel (PLAVIX) tablet 75 mg  75 mg Oral Daily Gouru, Aruna, MD   75 mg at 02/01/18 0919  . colchicine tablet 0.6 mg  0.6 mg Oral Daily PRN Gouru, Aruna, MD      . feeding supplement (NEPRO CARB STEADY) liquid 237 mL  237 mL Oral BID BM Gouru, Aruna, MD   237 mL at  02/01/18 1507  . furosemide (LASIX) tablet 40 mg  40 mg Oral Daily Gouru, Aruna, MD   40 mg at 02/01/18 0920  . heparin injection 5,000 Units  5,000 Units Subcutaneous Q8H Hallaji, Sheema M, RPH   5,000 Units at 02/01/18 1507  . hydrALAZINE (APRESOLINE) tablet 25 mg  25 mg Oral Daily Gouru, Aruna, MD   25 mg at 02/01/18 0920  . HYDROcodone-acetaminophen (NORCO/VICODIN) 5-325 MG per tablet 1 tablet  1 tablet Oral Q4H PRN Henreitta Leber, MD   1 tablet at 02/01/18 1033  . irbesartan (AVAPRO) tablet 300 mg  300 mg Oral Daily Gouru, Aruna, MD   300 mg at 02/01/18 0921  . metoprolol succinate (TOPROL-XL) 24 hr tablet 25 mg  25 mg Oral QHS Gouru, Illene Silver, MD   25 mg at 01/31/18 2143  . multivitamin (RENA-VIT) tablet 1 tablet  1 tablet Oral QHS Nicholes Mango, MD   1 tablet at 01/31/18 2208  . multivitamin-lutein (OCUVITE-LUTEIN) capsule 1 capsule  1 capsule Oral Daily Gouru, Aruna, MD   1 capsule at 02/01/18 0921  . ondansetron (ZOFRAN) injection 4 mg  4 mg Intravenous Q6H PRN Gouru, Aruna, MD   4 mg at 01/30/18 0956  . polyethylene glycol (MIRALAX / GLYCOLAX) packet 17 g  17 g Oral Daily PRN Gouru, Aruna, MD      . sodium chloride flush (NS) 0.9 % injection 3 mL  3 mL Intravenous Q12H Henreitta Leber, MD   3 mL at 02/01/18 7035     Discharge Medications: Please see discharge summary for a list of discharge medications.  Relevant Imaging Results:  Relevant Lab Results:   Additional Information (SSN: [id number])  Imogen Maddalena, [name], LCSW

## 2018-02-01 NOTE — Progress Notes (Signed)
Per [facility] liaison [employer] SN[facility] authorization is still pending.   Shantele Reller, LCSW [phone]  

## 2018-02-01 NOTE — Discharge Planning (Signed)
Patient IV and tele removed.  RN assessment and VS revealed stability to go to [facility]. Report called and s/w Armandina Stammer, RN.  Discharge papers printed and placed in facility packet, along with signed pain script.  Informed of suggested FU appts and appts made.  EMS contacted to transport to room 706. Awaiting arrival.

## 2018-02-01 NOTE — Evaluation (Signed)
Physical Therapy Evaluation Patient Details Name: Johnny [name]. MRN: [id number] DOB: 1950/08/05 Today's Date: 02/01/2018   History of Present Illness  Pt is a 67 y/o M who presented with chest pain while at dialysis.  Chest pain resolved and pt seen by cardiology with no plans for acute interventinos, no new EKG changes.  Pt with LE ulcerations on R foot, beeing seen by wound care.  Pt's PMH includes CHF, gout, anemia, PVD.     Clinical Impression  Pt admitted with above diagnosis. Pt currently with functional limitations due to the deficits listed below (see PT Problem List). Johnny [name] was agreeable to therapy today but required max encouragement throughout session, ultimately putting forth effort toward mobility.  He currently requires up to mod assist with bed mobility.  He required max assist for ~75% sit>stand but did not stand fully, limited by pain Bil feet.  Given pt's current mobility status, recommending SNF at d/c.  Pt will benefit from skilled PT to increase their independence and safety with mobility to allow discharge to the venue listed below.      Follow Up Recommendations SNF    Equipment Recommendations  Other (comment)(TBD at next venue of care)    Recommendations for Other Services       Precautions / Restrictions Precautions Precautions: Fall Restrictions Weight Bearing Restrictions: No      Mobility  Bed Mobility Overal bed mobility: Needs Assistance Bed Mobility: Supine to Sit;Sit to Supine     Supine to sit: Mod assist Sit to supine: Min guard   General bed mobility comments: Max increased time with max encouragement provided and ultimately required mod assist to bring BLEs EOB for supine>sit.  Pt able to return from sitting to supine with min guard for safety.   Transfers Overall transfer level: Needs assistance Equipment used: Rolling walker (2 wheeled) Transfers: Sit to/from Stand Sit to Stand: Max assist;From elevated surface          General transfer comment: Pt repeating, "I can't do this, my feet hurt".  Pt with minimal effort on first few unsuccessful attempts.  Bed raised and on second attempt the pt rises to ~75% of standing before reporting pain causing him to sit despite encouragement to remain standing.   Ambulation/Gait             General Gait Details: Pt refused to attempt at this time due to pain  Stairs            Wheelchair Mobility    Modified Rankin (Stroke Patients Only)       Balance Overall balance assessment: Needs assistance Sitting-balance support: No upper extremity supported;Feet supported Sitting balance-Leahy Scale: Good Sitting balance - Comments: Pt able to sit EOB and participate in doffing socks.                                       Pertinent Vitals/Pain Pain Assessment: Faces Faces Pain Scale: Hurts whole lot Pain Location: Bil feet Pain Descriptors / Indicators: Grimacing;Guarding;Moaning Pain Intervention(s): Limited activity within patient's tolerance;Monitored during session    Home Living Family/patient expects to be discharged to:: Skilled nursing facility                 Additional Comments: Pt from [facility]    Prior Function Level of Independence: Needs assistance   Gait / Transfers Assistance Needed: Pt reports using RW at all  times when ambulating. Denies any recent falls.   ADL's / Homemaking Assistance Needed: Pt did not provide this information        Hand Dominance        Extremity/Trunk Assessment   Upper Extremity Assessment Upper Extremity Assessment: Overall WFL for tasks assessed    Lower Extremity Assessment Lower Extremity Assessment: RLE deficits/detail;LLE deficits/detail RLE Deficits / Details: Wounds to Bil feet with bandages.  Functionally strength appears to be ~3/5 BLEs.  LLE Deficits / Details: Wounds to Bil feet with bandages.  Functionally strength appears to be ~3/5 BLEs.         Communication   Communication: No difficulties  Cognition Arousal/Alertness: Awake/alert Behavior During Therapy: Agitated;Flat affect Overall Cognitive Status: Difficult to assess                                        General Comments      Exercises Other Exercises Other Exercises: Sitting EOB pt doffing socks Bil.  Requires min assist to initiate Bil due to pt pulling at bandage in addition to sock.  Min assist provided to completely doff R sock.    Assessment/Plan    PT Assessment Patient needs continued PT services  PT Problem List Decreased strength;Decreased activity tolerance;Decreased balance;Decreased mobility;Decreased knowledge of use of DME;Decreased safety awareness;Pain       PT Treatment Interventions DME instruction;Gait training;Stair training;Functional mobility training;Therapeutic activities;Therapeutic exercise;Balance training;Neuromuscular re-education;Patient/family education;Wheelchair mobility training    PT Goals (Current goals can be found in the Care Plan section)  Acute Rehab PT Goals Patient Stated Goal: decreased pain PT Goal Formulation: With patient Time For Goal Achievement: 02/15/18 Potential to Achieve Goals: Fair    Frequency Min 2X/week   Barriers to discharge Decreased caregiver support Per past therapy notes: Pt lives alone with only intermittent support available from a neighbor.    Co-evaluation               AM-PAC PT "6 Clicks" Daily Activity  Outcome Measure Difficulty turning over in bed (including adjusting bedclothes, sheets and blankets)?: A Little Difficulty moving from lying on back to sitting on the side of the bed? : Unable Difficulty sitting down on and standing up from a chair with arms (e.g., wheelchair, bedside commode, etc,.)?: Unable Help needed moving to and from a bed to chair (including a wheelchair)?: A Lot Help needed walking in hospital room?: Total Help needed climbing 3-5 steps  with a railing? : Total 6 Click Score: 9    End of Session Equipment Utilized During Treatment: Gait belt Activity Tolerance: Patient limited by fatigue;Patient limited by pain;Treatment limited secondary to agitation Patient left: in bed;with call bell/phone within reach;with bed alarm set Nurse Communication: Mobility status PT Visit Diagnosis: Pain;Unsteadiness on feet (R26.81);Other abnormalities of gait and mobility (R26.89);Muscle weakness (generalized) (M62.81);Difficulty in walking, not elsewhere classified (R26.2) Pain - Right/Left: Right(and L ) Pain - part of body: (feet)    Time: 0998-3382 PT Time Calculation (min) (ACUTE ONLY): 30 min   Charges:   PT Evaluation $PT Eval Moderate Complexity: 1 Mod PT Treatments $Therapeutic Activity: 8-22 mins        Collie Siad PT, DPT 02/01/2018, 9:05 AM

## 2018-02-03 LAB — CULTURE, BLOOD (ROUTINE X 2)
CULTURE: NO GROWTH
CULTURE: NO GROWTH

## 2018-02-13 ENCOUNTER — Encounter (INDEPENDENT_AMBULATORY_CARE_PROVIDER_SITE_OTHER): Payer: Medicare Other

## 2018-02-13 ENCOUNTER — Telehealth (INDEPENDENT_AMBULATORY_CARE_PROVIDER_SITE_OTHER): Payer: Self-pay

## 2018-02-13 NOTE — Telephone Encounter (Signed)
Patient friend Ms [name] called concerning about patient foot swelling because she has been getting bigger size shoes for the patient to wear but she was asking should she get him orthopedic shoes.I spoke with Dew and he advise for the patient to wear compression stockings,elevate,or maybe be put in wraps to control swelling.

## 2018-02-20 ENCOUNTER — Encounter (INDEPENDENT_AMBULATORY_CARE_PROVIDER_SITE_OTHER): Payer: Medicare Other

## 2018-02-27 ENCOUNTER — Encounter (INDEPENDENT_AMBULATORY_CARE_PROVIDER_SITE_OTHER): Payer: Medicare Other

## 2018-02-27 ENCOUNTER — Other Ambulatory Visit (INDEPENDENT_AMBULATORY_CARE_PROVIDER_SITE_OTHER): Payer: Self-pay | Admitting: Vascular Surgery

## 2018-02-27 ENCOUNTER — Ambulatory Visit (INDEPENDENT_AMBULATORY_CARE_PROVIDER_SITE_OTHER): Payer: Medicare Other

## 2018-02-27 ENCOUNTER — Ambulatory Visit (INDEPENDENT_AMBULATORY_CARE_PROVIDER_SITE_OTHER): Payer: Medicare Other | Admitting: Vascular Surgery

## 2018-02-27 ENCOUNTER — Encounter (INDEPENDENT_AMBULATORY_CARE_PROVIDER_SITE_OTHER): Payer: Self-pay | Admitting: Vascular Surgery

## 2018-02-27 VITALS — BP 175/77 | HR 92 | Resp 18 | Ht 67.0 in | Wt 135.0 lb

## 2018-02-27 DIAGNOSIS — Z95820 Peripheral vascular angioplasty status with implants and grafts: Secondary | ICD-10-CM | POA: Diagnosis not present

## 2018-02-27 DIAGNOSIS — I7025 Atherosclerosis of native arteries of other extremities with ulceration: Secondary | ICD-10-CM

## 2018-02-27 DIAGNOSIS — L97519 Non-pressure chronic ulcer of other part of right foot with unspecified severity: Secondary | ICD-10-CM

## 2018-02-27 DIAGNOSIS — Z992 Dependence on renal dialysis: Secondary | ICD-10-CM

## 2018-02-27 DIAGNOSIS — I70239 Atherosclerosis of native arteries of right leg with ulceration of unspecified site: Secondary | ICD-10-CM | POA: Diagnosis not present

## 2018-02-27 DIAGNOSIS — E785 Hyperlipidemia, unspecified: Secondary | ICD-10-CM | POA: Diagnosis not present

## 2018-02-27 DIAGNOSIS — L97411 Non-pressure chronic ulcer of right heel and midfoot limited to breakdown of skin: Secondary | ICD-10-CM

## 2018-02-27 DIAGNOSIS — E119 Type 2 diabetes mellitus without complications: Secondary | ICD-10-CM | POA: Insufficient documentation

## 2018-02-27 DIAGNOSIS — E7800 Pure hypercholesterolemia, unspecified: Secondary | ICD-10-CM | POA: Insufficient documentation

## 2018-02-27 DIAGNOSIS — L97211 Non-pressure chronic ulcer of right calf limited to breakdown of skin: Secondary | ICD-10-CM

## 2018-02-27 DIAGNOSIS — E1169 Type 2 diabetes mellitus with other specified complication: Secondary | ICD-10-CM

## 2018-02-27 DIAGNOSIS — N186 End stage renal disease: Secondary | ICD-10-CM | POA: Diagnosis not present

## 2018-02-27 DIAGNOSIS — E1122 Type 2 diabetes mellitus with diabetic chronic kidney disease: Secondary | ICD-10-CM

## 2018-02-27 NOTE — Progress Notes (Signed)
Subjective:    Patient ID: Johnny Bucco., male    DOB: 02/23/1951, 67 y.o.   MRN: [id number] Chief Complaint  Patient presents with  . Follow-up    1 month LE Arterial   Patient presents for his first post procedure follow-up.  Patient was seen with his family friend.  On January 18, 2018 the patient underwent an ultrasound guidance for vascular access left femoral artery, catheter placement into right peroneal artery and anterior tibial artery, aortogram and selective right lower extremity angiogram, percutaneous transluminal angioplasty of right peroneal artery and TP trunk with 3 mm x 30 cm angioplasty balloon, percutaneous transluminal angioplasty of right anterior tibial artery with 3 mm x 30 cm angioplasty balloon, percutaneous transluminal angioplasty of the entire right SFA and above-knee popliteal artery with two 5 mm diameter by 22 cm length Lutonix drug-coated angioplasty balloons, viabahn stent placement to the right above-knee popliteal artery and distal SFA for greater than 50% residual stenosis in these locations with a 6 mm diameter by 15 cm length stent with StarClose closure device left femoral artery.  The patient presents today with continued pain to the right lower extremity.  The patient notes that he is unable to walk on his right leg. The patient also notes that he is experiencing swelling to the right lower extremity.  [facility] and has not received any local wound care to the large distal calf/heel ulceration which was present before his angiogram.  The patient has also not been back to the podiatrist for the ulceration on his right fifth toe.  The patient's family friend is upset stating that we have not done anything for the patient.  The patient and his family friend note a foul odor from his right lower extremity.  Patient denies any fever, nausea or vomiting.  The patient underwent a bilateral ABI which was notable for biphasic waveforms to the  right tibial arteries as well as the left tibial arteries.  Compared to the previous ABI there has been a market improvement in the arterial perfusion to the right lower extremity.  Review of Systems  Constitutional: Negative.   HENT: Negative.   Eyes: Negative.   Respiratory: Negative.   Cardiovascular: Positive for leg swelling.       Right lower extremity wound  Gastrointestinal: Negative.   Endocrine: Negative.   Genitourinary: Negative.   Musculoskeletal: Negative.   Skin: Negative.   Allergic/Immunologic: Negative.   Neurological: Negative.   Hematological: Negative.   Psychiatric/Behavioral: Negative.       Objective:   Physical Exam  Constitutional: He is oriented to person, place, and time. He appears well-developed and well-nourished. No distress.  HENT:  Head: Normocephalic and atraumatic.  Right Ear: External ear normal.  Left Ear: External ear normal.  Eyes: Pupils are equal, round, and reactive to light. Conjunctivae and EOM are normal.  Neck: Normal range of motion.  Cardiovascular: Normal rate, regular rhythm, normal heart sounds and intact distal pulses.  Pulses:      Radial pulses are 2+ on the right side, and 2+ on the left side.  Hard to palpate pedal pulses however the foot is warm. Right lower extremity: Large wound to the distal right calf with eschar.  Most distal aspect with fibrinous exudate.  From what I can see there does look like to be a healthy wound bed underneath.  There is a foul odor from this area.  The patient's fifth toe on his right foot  is completely dry and gangrenous.  There is moderate edema to the right foot.  Pulmonary/Chest: Effort normal and breath sounds normal.  Musculoskeletal: Normal range of motion. He exhibits edema.  Neurological: He is alert and oriented to person, place, and time.  Skin: He is not diaphoretic.  Psychiatric: He has a normal mood and affect. His behavior is normal. Judgment and thought content normal.    Vitals reviewed.  BP (!) 175/77 (BP Location: Left Arm, Patient Position: Sitting)   Pulse 92   Resp 18   Ht 5\' 7"  (1.702 m)   Wt 135 lb (61.2 kg)   BMI 21.14 kg/m   Past Medical History:  Diagnosis Date  . Anemia   . Anxiety   . CHF (congestive heart failure) ([place])   . Chronic kidney disease   . Gout   . Hyperlipidemia   . Hypertension   . Peripheral vascular disease ([place])    Social History   Socioeconomic History  . Marital status: Single    Spouse name: Not on file  . Number of children: Not on file  . Years of education: Not on file  . Highest education level: Not on file  Occupational History  . Occupation: retired  Scientific laboratory technician  . Financial resource strain: Patient refused  . Food insecurity:    Worry: Patient refused    Inability: Patient refused  . Transportation needs:    Medical: Patient refused    Non-medical: Patient refused  Tobacco Use  . Smoking status: Former Research scientist (life sciences)  . Smokeless tobacco: Never Used  Substance and Sexual Activity  . Alcohol use: No  . Drug use: No  . Sexual activity: Not on file  Lifestyle  . Physical activity:    Days per week: Patient refused    Minutes per session: Patient refused  . Stress: Not on file  Relationships  . Social connections:    Talks on phone: Patient refused    Gets together: Patient refused    Attends religious service: Patient refused    Active member of club or organization: Patient refused    Attends meetings of clubs or organizations: Patient refused    Relationship status: Patient refused  . Intimate partner violence:    Fear of current or ex partner: Patient refused    Emotionally abused: Patient refused    Physically abused: Patient refused    Forced sexual activity: Patient refused  Other Topics Concern  . Not on file  Social History Narrative  . Not on file   Past Surgical History:  Procedure Laterality Date  . A/V SHUNTOGRAM Left 06/21/2017   Procedure: A/V SHUNTOGRAM;  Surgeon:  Katha Cabal, MD;  Location: [facility];  Service: Cardiovascular;  Laterality: Left;  . AV FISTULA PLACEMENT Left 09/18/2015   Procedure: INSERTION OF ARTERIOVENOUS (AV) GORE-TEX GRAFT ARM ( BRACH/AXILLARY GRAFT W/ INSTANT STICK GRAFT );  Surgeon: Katha Cabal, MD;  Location: [facility];  Service: Vascular;  Laterality: Left;  . DIALYSIS FISTULA CREATION    . ESOPHAGOGASTRODUODENOSCOPY N/A 12/19/2017   Procedure: ESOPHAGOGASTRODUODENOSCOPY (EGD);  Surgeon: Lin Landsman, MD;  Location: [facility];  Service: Gastroenterology;  Laterality: N/A;  . LOWER EXTREMITY ANGIOGRAPHY Left 11/16/2017   Procedure: LOWER EXTREMITY ANGIOGRAPHY;  Surgeon: Algernon Huxley, MD;  Location: [facility];  Service: Cardiovascular;  Laterality: Left;  . LOWER EXTREMITY ANGIOGRAPHY Right 01/18/2018   Procedure: LOWER EXTREMITY ANGIOGRAPHY;  Surgeon: Algernon Huxley, MD;  Location: [facility];  Service: Cardiovascular;  Laterality: Right;  . PERIPHERAL VASCULAR CATHETERIZATION Left 09/01/2015   Procedure: A/V Shuntogram/Fistulagram;  Surgeon: Katha Cabal, MD;  Location: [facility];  Service: Cardiovascular;  Laterality: Left;  . PERIPHERAL VASCULAR CATHETERIZATION N/A 09/30/2015   Procedure: A/V Shuntogram/Fistulagram with perm cathether removal;  Surgeon: Algernon Huxley, MD;  Location: [facility];  Service: Cardiovascular;  Laterality: N/A;  . PERIPHERAL VASCULAR CATHETERIZATION Left 09/30/2015   Procedure: A/V Shunt Intervention;  Surgeon: Algernon Huxley, MD;  Location: [facility];  Service: Cardiovascular;  Laterality: Left;  . PERIPHERAL VASCULAR CATHETERIZATION Left 12/03/2015   Procedure: Thrombectomy;  Surgeon: Algernon Huxley, MD;  Location: [facility];  Service: Cardiovascular;  Laterality: Left;  . PERIPHERAL VASCULAR CATHETERIZATION Left 01/28/2016   Procedure: Thrombectomy;  Surgeon: Algernon Huxley, MD;  Location: [facility];   Service: Cardiovascular;  Laterality: Left;  . PERIPHERAL VASCULAR CATHETERIZATION N/A 01/28/2016   Procedure: A/V Shuntogram/Fistulagram;  Surgeon: Algernon Huxley, MD;  Location: [facility];  Service: Cardiovascular;  Laterality: N/A;   Family History  Problem Relation Age of Onset  . Hypertension Unknown   . Heart disease Unknown    Allergies  Allergen Reactions  . Shellfish Allergy Anaphylaxis      Assessment & Plan:  Patient presents for his first post procedure follow-up.  Patient was seen with his family friend.  On January 18, 2018 the patient underwent an ultrasound guidance for vascular access left femoral artery, catheter placement into right peroneal artery and anterior tibial artery, aortogram and selective right lower extremity angiogram, percutaneous transluminal angioplasty of right peroneal artery and TP trunk with 3 mm x 30 cm angioplasty balloon, percutaneous transluminal angioplasty of right anterior tibial artery with 3 mm x 30 cm angioplasty balloon, percutaneous transluminal angioplasty of the entire right SFA and above-knee popliteal artery with two 5 mm diameter by 22 cm length Lutonix drug-coated angioplasty balloons, viabahn stent placement to the right above-knee popliteal artery and distal SFA for greater than 50% residual stenosis in these locations with a 6 mm diameter by 15 cm length stent with StarClose closure device left femoral artery.  The patient presents today with continued pain to the right lower extremity.  The patient notes that he is unable to walk on his right leg. The patient also notes that he is experiencing swelling to the right lower extremity.  [facility] and has not received any local wound care to the large distal calf/heel ulceration which was present before his angiogram.  The patient has also not been back to the podiatrist for the ulceration on his right fifth toe.  The patient's family friend is upset stating that  we have not done anything for the patient.  The patient and his family friend note a foul odor from his right lower extremity.  Patient denies any fever, nausea or vomiting.  The patient underwent a bilateral ABI which was notable for biphasic waveforms to the right tibial arteries as well as the left tibial arteries.  Compared to the previous ABI there has been a market improvement in the arterial perfusion to the right lower extremity.  1. Atherosclerosis of native arteries of the extremities with ulceration ([place]) - Stable Patient presents for his first post procedure follow-up The patient is status post a right lower extremity angiogram with angioplasty and stent placement The [facility] is [facility] and has  received no local wound care to the wound on the back of his right calf, heel and fifth pinky toe. The patient has not been to the wound center or back to the podiatrist. Patient underwent ABI today which is shown markedly improved blood flow to the right lower extremity The patient has a large amount of eschar/fibrinous exudate to the wound located to the back of his calf/heel.  In order for the patient to be able to heal this area the eschar and the fibrinous eschar exudate must be debrided. The patient will also level most likely need to have his right fifth digit removed This was all explained to the patient and his family friend however they felt my explanation was not appropriate and demanded to see Dr. Lucky Cowboy Dr. Lucky Cowboy saw and examined the patient, agreed with my assesment and plan and will move forward with debridement to the right lower extremity distal calf, heel and amputation of the fifth right digit. Procedure, risks and benefits were explained to the patient and his family friend All questions were answered Patient would like to move forward with Dr. Lucky Cowboy completing all of the procedures.  2. ESRD on dialysis ([place]) - Stable The patient denies having any issues with his  dialysis access at this time The patient should continue his normal HDA follow-up  3. Hyperlipidemia, unspecified hyperlipidemia type - Stable Encouraged good control as its slows the progression of atherosclerotic disease  4. Type 2 diabetes mellitus with other specified complication, unspecified whether long term insulin use ([facility]) - Stable Encouraged good control as its slows the progression of atherosclerotic disease  Current Outpatient Medications on File Prior to Visit  Medication Sig Dispense Refill  . allopurinol (ZYLOPRIM) 100 MG tablet Take 100 mg by mouth daily.    [name] amLODipine (NORVASC) 10 MG tablet Take 10 mg by mouth daily.     [name] atorvastatin (LIPITOR) 10 MG tablet Take 1 tablet by mouth daily.    . colchicine 0.6 MG tablet Take 0.6 mg by mouth daily as needed (for gout flares).     . folic acid-vitamin b complex-vitamin c-selenium-zinc (DIALYVITE) 3 MG TABS tablet Take by mouth.    . furosemide (LASIX) 40 MG tablet Take 40 mg by mouth daily.     . hydrALAZINE (APRESOLINE) 25 MG tablet Take 25 mg by mouth daily.     [name] HYDROcodone-acetaminophen (NORCO/VICODIN) 5-325 MG tablet Take 1 tablet by mouth every 4 (four) hours as needed for moderate pain. 20 tablet 0  . irbesartan (AVAPRO) 300 MG tablet Take 300 mg by mouth daily.     . clonazePAM (KLONOPIN) 0.5 MG tablet Take 0.5 tablets (0.25 mg total) by mouth 2 (two) times daily as needed (insomnia). (Patient not taking: Reported on 02/27/2018) 30 tablet 0  . clopidogrel (PLAVIX) 75 MG tablet Take 1 tablet (75 mg total) by mouth daily. (Patient not taking: Reported on 01/20/2018) 30 tablet 11  . metoprolol succinate (TOPROL XL) 25 MG 24 hr tablet Take 1 tablet (25 mg total) by mouth at bedtime. (Patient not taking: Reported on 01/29/2018) 30 tablet 0  . multivitamin (RENA-VIT) TABS tablet Take 1 tablet by mouth at bedtime. (Patient not taking: Reported on 01/20/2018) 30 tablet 0  . multivitamin-lutein (OCUVITE-LUTEIN) CAPS capsule Take 1  capsule by mouth daily. (Patient not taking: Reported on 01/20/2018) 30 capsule 0  . Nutritional Supplements (FEEDING SUPPLEMENT, NEPRO CARB STEADY,) LIQD Take 237 mLs by mouth 2 (two) times daily between meals. (Patient not taking: Reported  on 01/20/2018) 30 Can 0  . polyethylene glycol (MIRALAX / GLYCOLAX) packet Take 17 g by mouth daily as needed for mild constipation. (Patient not taking: Reported on 01/29/2018) 14 each 0   No current facility-administered medications on file prior to visit.    There are no Patient Instructions on file for this visit. No follow-ups on file.  Melbert Botelho A Lilou Kneip, PA-C

## 2018-02-28 ENCOUNTER — Emergency Department: Payer: Medicare Other

## 2018-02-28 ENCOUNTER — Other Ambulatory Visit: Payer: Self-pay

## 2018-02-28 ENCOUNTER — Emergency Department
Admission: EM | Admit: 2018-02-28 | Discharge: 2018-02-28 | Disposition: A | Discharge status: Home / Self Care | Payer: Medicare Other | Attending: Emergency Medicine | Admitting: Emergency Medicine

## 2018-02-28 DIAGNOSIS — I5032 Chronic diastolic (congestive) heart failure: Secondary | ICD-10-CM | POA: Diagnosis not present

## 2018-02-28 DIAGNOSIS — Z79899 Other long term (current) drug therapy: Secondary | ICD-10-CM | POA: Diagnosis not present

## 2018-02-28 DIAGNOSIS — E785 Hyperlipidemia, unspecified: Secondary | ICD-10-CM | POA: Insufficient documentation

## 2018-02-28 DIAGNOSIS — R531 Weakness: Secondary | ICD-10-CM | POA: Diagnosis present

## 2018-02-28 DIAGNOSIS — I132 Hypertensive heart and chronic kidney disease with heart failure and with stage 5 chronic kidney disease, or end stage renal disease: Secondary | ICD-10-CM | POA: Diagnosis not present

## 2018-02-28 DIAGNOSIS — N186 End stage renal disease: Secondary | ICD-10-CM | POA: Insufficient documentation

## 2018-02-28 DIAGNOSIS — Z87891 Personal history of nicotine dependence: Secondary | ICD-10-CM | POA: Diagnosis not present

## 2018-02-28 DIAGNOSIS — Z7902 Long term (current) use of antithrombotics/antiplatelets: Secondary | ICD-10-CM | POA: Insufficient documentation

## 2018-02-28 DIAGNOSIS — Z992 Dependence on renal dialysis: Secondary | ICD-10-CM | POA: Insufficient documentation

## 2018-02-28 DIAGNOSIS — E1122 Type 2 diabetes mellitus with diabetic chronic kidney disease: Secondary | ICD-10-CM | POA: Insufficient documentation

## 2018-02-28 LAB — CBC WITH DIFFERENTIAL/PLATELET
ABS IMMATURE GRANULOCYTES: 0.1 10*3/uL — AB (ref 0.00–0.07)
BASOS ABS: 0.1 10*3/uL (ref 0.0–0.1)
Basophils Relative: 1 %
EOS ABS: 0.2 10*3/uL (ref 0.0–0.5)
Eosinophils Relative: 1 %
HCT: 32.5 % — ABNORMAL LOW (ref 39.0–52.0)
HEMOGLOBIN: 10.1 g/dL — AB (ref 13.0–17.0)
IMMATURE GRANULOCYTES: 1 %
LYMPHS ABS: 1.4 10*3/uL (ref 0.7–4.0)
LYMPHS PCT: 10 %
MCH: 30.9 pg (ref 26.0–34.0)
MCHC: 31.1 g/dL (ref 30.0–36.0)
MCV: 99.4 fL (ref 80.0–100.0)
MONOS PCT: 9 %
Monocytes Absolute: 1.2 10*3/uL — ABNORMAL HIGH (ref 0.1–1.0)
NEUTROS PCT: 78 %
NRBC: 0 % (ref 0.0–0.2)
Neutro Abs: 11 10*3/uL — ABNORMAL HIGH (ref 1.7–7.7)
Platelets: 235 10*3/uL (ref 150–400)
RBC: 3.27 MIL/uL — ABNORMAL LOW (ref 4.22–5.81)
RDW: 17.6 % — ABNORMAL HIGH (ref 11.5–15.5)
WBC: 14.1 10*3/uL — ABNORMAL HIGH (ref 4.0–10.5)

## 2018-02-28 LAB — COMPREHENSIVE METABOLIC PANEL
ALBUMIN: 2.8 g/dL — AB (ref 3.5–5.0)
ALT: 14 U/L (ref 0–44)
ANION GAP: 10 (ref 5–15)
AST: 30 U/L (ref 15–41)
Alkaline Phosphatase: 72 U/L (ref 38–126)
BUN: 19 mg/dL (ref 8–23)
CALCIUM: 8.4 mg/dL — AB (ref 8.9–10.3)
CHLORIDE: 100 mmol/L (ref 98–111)
CO2: 27 mmol/L (ref 22–32)
Creatinine, Ser: 4.34 mg/dL — ABNORMAL HIGH (ref 0.61–1.24)
GFR calc Af Amer: 15 mL/min — ABNORMAL LOW (ref >60)
GFR calc non Af Amer: 13 mL/min — ABNORMAL LOW (ref >60)
GLUCOSE: 111 mg/dL — AB (ref 70–99)
Potassium: 3.7 mmol/L (ref 3.5–5.1)
SODIUM: 137 mmol/L (ref 135–145)
Total Bilirubin: 0.5 mg/dL (ref 0.3–1.2)
Total Protein: 6.2 g/dL — ABNORMAL LOW (ref 6.5–8.1)

## 2018-02-28 LAB — TROPONIN I
TROPONIN I: 0.05 ng/mL — AB (ref <0.03)
Troponin I: 0.05 ng/mL (ref <0.03)

## 2018-02-28 LAB — INFLUENZA PANEL BY PCR (TYPE A & B)
Influenza A By PCR: NEGATIVE
Influenza B By PCR: NEGATIVE

## 2018-02-28 LAB — VITAMIN B12: Vitamin B-12: 266 pg/mL (ref 180–914)

## 2018-02-28 MED ORDER — HYDROCODONE-ACETAMINOPHEN 5-325 MG PO TABS
ORAL_TABLET | ORAL | Status: AC
  Administered 2018-02-28: 1 via ORAL
  Filled 2018-02-28: qty 1

## 2018-02-28 MED ORDER — HYDROCODONE-ACETAMINOPHEN 5-325 MG PO TABS
1.0000 | ORAL_TABLET | ORAL | Status: AC
  Administered 2018-02-28: 1 via ORAL

## 2018-02-28 NOTE — ED Notes (Signed)
Pt stuck 3 times with no success. Lab notified and is coming to draw labs. Lab sending SST tubes.

## 2018-02-28 NOTE — ED Notes (Signed)
Right leg and right foot dressing changed-sterile gauze and coban used as how patient arrived with dressing that was done yesterday at Dr. Bunnie Domino office.   Pt left 4th toe wrapped with sterile gauze per request of family. Yellow and foul smelling drainage from both wounds-right posterior leg and left 4th digit.

## 2018-02-28 NOTE — ED Notes (Signed)
Date and time results received: 02/28/18 1520   (use smartphrase ".now" to insert current time)  Test: troponin Critical Value: 0.05  Name of Provider Notified: Malinda  Orders Received? Or Actions Taken?: MD made aware.

## 2018-02-28 NOTE — ED Notes (Signed)
Lab called for repeat troponin draw at this time. Lab verified and said they would come.

## 2018-02-28 NOTE — ED Provider Notes (Signed)
[facility] Emergency Department Provider Note   ____________________________________________   First MD Initiated Contact with Patient 02/28/18 1359     (approximate)  I have reviewed the triage vital signs and the nursing notes.   HISTORY  Chief Complaint Weakness    HPI Johnny [name]. is a 67 y.o. male who reports he has been having burning in the back of his leg since Dr. do did a vascular bypass in both groins in September 12.  He has follow-up scheduled with Dr. do for that.  He also reports that today he was sitting at home getting ready to eat when his caregivers thought he looked tired and kind of out of it and took his temperature.  Not sure if he had a fever that or not.  He was sent here because he looked tired and felt tired.  Patient reports he is feeling somewhat better now.  He does not feel anything like he felt at home.  This was after having dialysis today.  he is not having a headache chest pain belly pain shortness of breath fever nausea vomiting or any other complaints except for the burning in the backs of his legs.   Past Medical History:  Diagnosis Date  . Anemia   . Anxiety   . CHF (congestive heart failure) ([place])   . Chronic kidney disease   . Gout   . Hyperlipidemia   . Hypertension   . Peripheral vascular disease [facility])     Patient Active Problem List   Diagnosis Date Noted  . Hyperlipidemia 02/27/2018  . Diabetes ([place]) 02/27/2018  . Weakness of right lower extremity 01/20/2018  . Fever   . Periumbilical abdominal pain   . Confusion 12/22/2017  . Acute delirium 12/21/2017  . Protein-calorie malnutrition, severe 12/19/2017  . Intractable nausea and vomiting 12/18/2017  . Lymphedema 12/13/2017  . Cellulitis 11/27/2017  . Chest pain 11/19/2017  . Atherosclerosis of native arteries of the extremities with ulceration ([place]) 11/07/2017  . Elevated troponin 10/02/2015  . Complications, dialysis, catheter, mechanical  ([place]) 10/02/2015  . Musculoskeletal chest pain 09/28/2015  . ESRD on dialysis ([place]) 09/28/2015  . HTN (hypertension) 09/28/2015  . Chronic diastolic CHF (congestive heart failure) ([place]) 09/28/2015  . Gout 09/28/2015    Past Surgical History:  Procedure Laterality Date  . A/V SHUNTOGRAM Left 06/21/2017   Procedure: A/V SHUNTOGRAM;  Surgeon: Katha Cabal, MD;  Location: [place] CV LAB;  Service: Cardiovascular;  Laterality: Left;  . AV FISTULA PLACEMENT Left 09/18/2015   Procedure: INSERTION OF ARTERIOVENOUS (AV) GORE-TEX GRAFT ARM ( BRACH/AXILLARY GRAFT W/ INSTANT STICK GRAFT );  Surgeon: Katha Cabal, MD;  Location: [facility] ORS;  Service: Vascular;  Laterality: Left;  . DIALYSIS FISTULA CREATION    . ESOPHAGOGASTRODUODENOSCOPY N/A 12/19/2017   Procedure: ESOPHAGOGASTRODUODENOSCOPY (EGD);  Surgeon: Lin Landsman, MD;  Location: [facility] ENDOSCOPY;  Service: Gastroenterology;  Laterality: N/A;  . LOWER EXTREMITY ANGIOGRAPHY Left 11/16/2017   Procedure: LOWER EXTREMITY ANGIOGRAPHY;  Surgeon: Algernon Huxley, MD;  Location: [place] CV LAB;  Service: Cardiovascular;  Laterality: Left;  . LOWER EXTREMITY ANGIOGRAPHY Right 01/18/2018   Procedure: LOWER EXTREMITY ANGIOGRAPHY;  Surgeon: Algernon Huxley, MD;  Location: [place] CV LAB;  Service: Cardiovascular;  Laterality: Right;  . PERIPHERAL VASCULAR CATHETERIZATION Left 09/01/2015   Procedure: A/V Shuntogram/Fistulagram;  Surgeon: Katha Cabal, MD;  Location: [place] CV LAB;  Service: Cardiovascular;  Laterality: Left;  . PERIPHERAL VASCULAR CATHETERIZATION N/A  09/30/2015   Procedure: A/V Shuntogram/Fistulagram with perm cathether removal;  Surgeon: Algernon Huxley, MD;  Location: [place] CV LAB;  Service: Cardiovascular;  Laterality: N/A;  . PERIPHERAL VASCULAR CATHETERIZATION Left 09/30/2015   Procedure: A/V Shunt Intervention;  Surgeon: Algernon Huxley, MD;  Location: [place] CV LAB;  Service: Cardiovascular;   Laterality: Left;  . PERIPHERAL VASCULAR CATHETERIZATION Left 12/03/2015   Procedure: Thrombectomy;  Surgeon: Algernon Huxley, MD;  Location: [place] CV LAB;  Service: Cardiovascular;  Laterality: Left;  . PERIPHERAL VASCULAR CATHETERIZATION Left 01/28/2016   Procedure: Thrombectomy;  Surgeon: Algernon Huxley, MD;  Location: [place] CV LAB;  Service: Cardiovascular;  Laterality: Left;  . PERIPHERAL VASCULAR CATHETERIZATION N/A 01/28/2016   Procedure: A/V Shuntogram/Fistulagram;  Surgeon: Algernon Huxley, MD;  Location: [place] CV LAB;  Service: Cardiovascular;  Laterality: N/A;    Prior to Admission medications   Medication Sig Start Date End Date Taking? Authorizing Provider  allopurinol (ZYLOPRIM) 100 MG tablet Take 100 mg by mouth daily.    [provider]  amLODipine (NORVASC) 10 MG tablet Take 10 mg by mouth daily.     [provider]  atorvastatin (LIPITOR) 10 MG tablet Take 1 tablet by mouth daily. 11/23/17   [provider]  clonazePAM (KLONOPIN) 0.5 MG tablet Take 0.5 tablets (0.25 mg total) by mouth 2 (two) times daily as needed (insomnia). Patient not taking: Reported on 02/27/2018 01/24/18   Loletha Grayer, MD  clopidogrel (PLAVIX) 75 MG tablet Take 1 tablet (75 mg total) by mouth daily. Patient not taking: Reported on 01/20/2018 12/03/15   Algernon Huxley, MD  colchicine 0.6 MG tablet Take 0.6 mg by mouth daily as needed (for gout flares).     [provider]  folic acid-vitamin b complex-vitamin c-selenium-zinc (DIALYVITE) 3 MG TABS tablet Take by mouth.    [provider]  furosemide (LASIX) 40 MG tablet Take 40 mg by mouth daily.     [provider]  hydrALAZINE (APRESOLINE) 25 MG tablet Take 25 mg by mouth daily.     [provider]  HYDROcodone-acetaminophen (NORCO/VICODIN) 5-325 MG tablet Take 1 tablet by mouth every 4 (four) hours as needed for moderate pain. 02/01/18   Henreitta Leber, MD  irbesartan (AVAPRO) 300  MG tablet Take 300 mg by mouth daily.     [provider]  metoprolol succinate (TOPROL XL) 25 MG 24 hr tablet Take 1 tablet (25 mg total) by mouth at bedtime. Patient not taking: Reported on 01/29/2018 01/24/18   Loletha Grayer, MD  multivitamin (RENA-VIT) TABS tablet Take 1 tablet by mouth at bedtime. Patient not taking: Reported on 01/20/2018 12/25/17   Fritzi Mandes, MD  multivitamin-lutein [facility]) CAPS capsule Take 1 capsule by mouth daily. Patient not taking: Reported on 01/20/2018 12/25/17   Fritzi Mandes, MD  Nutritional Supplements (FEEDING SUPPLEMENT, NEPRO CARB STEADY,) LIQD Take 237 mLs by mouth 2 (two) times daily between meals. Patient not taking: Reported on 01/20/2018 12/25/17   Fritzi Mandes, MD  polyethylene glycol [facility] Floria Raveling) packet Take 17 g by mouth daily as needed for mild constipation. Patient not taking: Reported on 01/29/2018 01/24/18   Loletha Grayer, MD    Allergies Shellfish allergy  Family History  Problem Relation Age of Onset  . Hypertension Unknown   . Heart disease Unknown     Social History Social History   Tobacco Use  . Smoking status: Former Research scientist (life sciences)  . Smokeless tobacco: Never Used  Substance Use Topics  . Alcohol use: No  . Drug use: No    Review of Systems  Constitutional: No fever/chills Eyes: No visual changes. ENT: No sore throat. Cardiovascular: Denies chest pain. Respiratory: Denies shortness of breath. Gastrointestinal: No abdominal pain.  No nausea, no vomiting.  No diarrhea.  No constipation. Genitourinary: Negative for dysuria. Musculoskeletal: Negative for back pain. Skin: Negative for rash. Neurological: Negative for headaches, focal weakness   ____________________________________________   PHYSICAL EXAM:  VITAL SIGNS: ED Triage Vitals  Enc Vitals Group     BP 02/28/18 1401 111/62     Pulse Rate 02/28/18 1401 91     Resp 02/28/18 1401 (!) 22     Temp 02/28/18 1401 98.4 F (36.9 C)     Temp  Source 02/28/18 1401 Oral     SpO2 02/28/18 1401 100 %     Weight 02/28/18 1358 134 lb 14.7 oz (61.2 kg)     Height 02/28/18 1358 5\' 7"  (1.702 m)     Head Circumference --      Peak Flow --      Pain Score 02/28/18 1358 10     Pain Loc --      Pain Edu? --      Excl. in [place]? --     Constitutional: Alert and oriented. Well appearing and in no acute distress. Eyes: Conjunctivae are normal.  Head: Atraumatic. Nose: No congestion/rhinnorhea. Mouth/Throat: Mucous membranes are moist.  Oropharynx non-erythematous. Neck: No stridor. Cardiovascular: Normal rate, regular rhythm. Grossly normal heart sounds.  Feet are warm but I cannot palpate a pulse on either foot. Respiratory: Normal respiratory effort.  No retractions. Lungs CTAB. Gastrointestinal: Soft and nontender. No distention. No abdominal bruits. No CVA tenderness. Musculoskeletal: No lower extremity tenderness patient has a necrotic area on his left foot and the fourth toe and the posterior of his right lower leg he is got a necrotic eschar which is starting to peel off exposing red/pink tissue underneath Neurologic:  Normal speech and language.  Patient able to move all 4 extremities equally and well Skin: Please see musculoskeletal regarding the legs Psychiatric: Mood and affect are normal. Speech and behavior are normal.  ____________________________________________   LABS (all labs ordered are listed, but only abnormal results are displayed)  Labs Reviewed  COMPREHENSIVE METABOLIC PANEL - Abnormal; Notable for the following components:      Result Value   Glucose, Bld 111 (*)    Creatinine, Ser 4.34 (*)    Calcium 8.4 (*)    Total Protein 6.2 (*)    Albumin 2.8 (*)    GFR calc non Af Amer 13 (*)    GFR calc Af Amer 15 (*)    All other components within normal limits  CBC WITH DIFFERENTIAL/PLATELET - Abnormal; Notable for the following components:   WBC 14.1 (*)    RBC 3.27 (*)    Hemoglobin 10.1 (*)    HCT 32.5 (*)     RDW 17.6 (*)    Neutro Abs 11.0 (*)    Monocytes Absolute 1.2 (*)    Abs Immature Granulocytes 0.10 (*)    All other components within normal limits  TROPONIN I - Abnormal; Notable for the following components:   Troponin I 0.05 (*)    All other components within normal limits  TROPONIN I - Abnormal; Notable for the following components:   Troponin I 0.05 (*)    All other components within normal limits  INFLUENZA PANEL BY PCR (  TYPE A & B)  VITAMIN B12  FOLATE RBC   ____________________________________________  EKG   ____________________________________________  RADIOLOGY  ED MD interpretation:   Official radiology report(s): Dg Chest 2 View  Result Date: 02/28/2018 CLINICAL DATA:  Weakness EXAM: CHEST - 2 VIEW COMPARISON:  01/29/2018, CT and chest radiograph FINDINGS: No pleural effusion. Streaky atelectasis at the left base. Mild cardiomegaly with aortic atherosclerosis. No pneumothorax. Vascular stents in the left upper extremity and axillary region. IMPRESSION: No active cardiopulmonary disease. Cardiomegaly with streaky atelectasis at the left lung base. Electronically Signed   By: Donavan Foil M.D.   On: 02/28/2018 15:26    ____________________________________________   PROCEDURES  Procedure(s) performed:   Procedures  Critical Care performed:   ____________________________________________   INITIAL IMPRESSION / ASSESSMENT AND PLAN / ED COURSE  Patient's troponin is stable at 0.05.  This is slightly lower than it has been.  Patient's white blood count is elevated little bit.  This could easily be explained by the eschar that separating on the right posterior lower leg or by the toe.  Patient's caregiver reports that Dr. Delana Meyer is planning on debriding the leg next week and amputating the toe.  She was not here initially when the patient was saying he did not know what was going on.  Caregiver thinks patient is looking quite well now.  He has not been  coughing at all wise he is been here and he feels better 2.  I have not found any other thing that needs to be treated at this point.  I will let them go they will return if worse.      ____________________________________________   FINAL CLINICAL IMPRESSION(S) / ED DIAGNOSES  Final diagnoses:  Weakness     ED Discharge Orders    None       Note:  This document was prepared using Dragon voice recognition software and may include unintentional dictation errors.    Nena Polio, MD 02/28/18 1758

## 2018-02-28 NOTE — ED Triage Notes (Signed)
Pt to ED via [facility]. Per ems pt stated he went to dialysis this morning and after felt more weak and tired than normal. Pt c/o burnign sensation in bilateral legs. Pt has infected right little toe that is due to be amputated sometime next week. Pts vitals per ems 100/60, 84 hr, 99% RA.

## 2018-02-28 NOTE — Discharge Instructions (Addendum)
Please call Dr. Lucky Cowboy or Dr. Delana Meyer tomorrow.  Dr. Delana Meyer will be in the office tomorrow.  Just double check on the plan with them.  Please return here for fever vomiting feeling sicker or worse at all.  Change the dressing on the leg every day.  If there is any problem with it please return here and will help you.

## 2018-03-01 ENCOUNTER — Telehealth (INDEPENDENT_AMBULATORY_CARE_PROVIDER_SITE_OTHER): Payer: Self-pay | Admitting: Vascular Surgery

## 2018-03-01 LAB — FOLATE RBC
FOLATE, HEMOLYSATE: 486 ng/mL
Folate, RBC: 1730 ng/mL (ref >498)
Hematocrit: 28.1 % — ABNORMAL LOW (ref 37.5–51.0)

## 2018-03-01 NOTE — Telephone Encounter (Signed)
The patient will need have cardiac clearance before I can schedule his surgery. I am working on getting that from his cardiologist at [facility].

## 2018-03-06 ENCOUNTER — Ambulatory Visit (INDEPENDENT_AMBULATORY_CARE_PROVIDER_SITE_OTHER): Payer: Medicare Other | Admitting: Vascular Surgery

## 2018-03-14 ENCOUNTER — Other Ambulatory Visit (INDEPENDENT_AMBULATORY_CARE_PROVIDER_SITE_OTHER): Payer: Self-pay | Admitting: Nurse Practitioner

## 2018-03-14 ENCOUNTER — Encounter (INDEPENDENT_AMBULATORY_CARE_PROVIDER_SITE_OTHER): Payer: Self-pay

## 2018-03-20 ENCOUNTER — Encounter: Payer: Medicare Other | Attending: Physician Assistant | Admitting: Physician Assistant

## 2018-03-20 ENCOUNTER — Telehealth (INDEPENDENT_AMBULATORY_CARE_PROVIDER_SITE_OTHER): Payer: Self-pay | Admitting: Vascular Surgery

## 2018-03-20 DIAGNOSIS — Z79899 Other long term (current) drug therapy: Secondary | ICD-10-CM | POA: Diagnosis not present

## 2018-03-20 DIAGNOSIS — L97822 Non-pressure chronic ulcer of other part of left lower leg with fat layer exposed: Secondary | ICD-10-CM | POA: Insufficient documentation

## 2018-03-20 DIAGNOSIS — Z7901 Long term (current) use of anticoagulants: Secondary | ICD-10-CM | POA: Insufficient documentation

## 2018-03-20 DIAGNOSIS — N186 End stage renal disease: Secondary | ICD-10-CM | POA: Diagnosis not present

## 2018-03-20 DIAGNOSIS — Z992 Dependence on renal dialysis: Secondary | ICD-10-CM | POA: Diagnosis not present

## 2018-03-20 DIAGNOSIS — I120 Hypertensive chronic kidney disease with stage 5 chronic kidney disease or end stage renal disease: Secondary | ICD-10-CM | POA: Insufficient documentation

## 2018-03-20 DIAGNOSIS — Z86718 Personal history of other venous thrombosis and embolism: Secondary | ICD-10-CM | POA: Diagnosis not present

## 2018-03-20 DIAGNOSIS — E11621 Type 2 diabetes mellitus with foot ulcer: Secondary | ICD-10-CM | POA: Diagnosis present

## 2018-03-20 DIAGNOSIS — E1122 Type 2 diabetes mellitus with diabetic chronic kidney disease: Secondary | ICD-10-CM | POA: Insufficient documentation

## 2018-03-20 DIAGNOSIS — L97522 Non-pressure chronic ulcer of other part of left foot with fat layer exposed: Secondary | ICD-10-CM | POA: Diagnosis not present

## 2018-03-20 DIAGNOSIS — M109 Gout, unspecified: Secondary | ICD-10-CM | POA: Insufficient documentation

## 2018-03-20 NOTE — Telephone Encounter (Signed)
Patient was seen at the [facility] today by Jeri Cos, PA-C.  Spoke with wound center PA in regard to patient's right fifth toe.  During the patient's visit to the wound clinic today, the fifth toe looks to be viable in his opinion.  Distal calf still in need of debridement.  Patient is scheduled to undergo surgery (distal calf debridement with VAC placement, amputation of right fifth toe) on March 28, 2018.    Before surgery we will examine the patient's right foot to assess if his fifth toe does need to be amputated.    The patient was recently found to have osteo to the fourth toe on the left foot.  Patient has undergone a left lower extremity angiogram with intervention on November 16, 2017.  Bilateral ABI on February 27, 2018, Left: Biphasic waveforms with ABI of 0.56.  An x-ray of the left foot on February 28, 2018 was notable for "Subtle osteolysis at the medial head of the 4th proximal phalanx since July radiographs highly suspicious for Acute Osteomyelitis. 2. No other acute osseous abnormality identified. No soft tissue gas."  In the setting osteomyelitis with known peripheral artery disease I will put the patient on Dr. Bunnie Domino schedule to undergo a left lower extremity angiogram with possible intervention in an attempt to assess the patient's anatomy, degree of peripheral artery disease and if appropriate an attempt to revascularize the leg can be made at that time.

## 2018-03-21 ENCOUNTER — Other Ambulatory Visit (INDEPENDENT_AMBULATORY_CARE_PROVIDER_SITE_OTHER): Payer: Self-pay | Admitting: Vascular Surgery

## 2018-03-21 ENCOUNTER — Encounter (INDEPENDENT_AMBULATORY_CARE_PROVIDER_SITE_OTHER): Payer: Self-pay

## 2018-03-22 ENCOUNTER — Encounter
Admission: RE | Admit: 2018-03-22 | Discharge: 2018-03-22 | Disposition: A | Discharge status: Home / Self Care | Payer: Medicare Other | Source: Ambulatory Visit | Attending: Vascular Surgery | Admitting: Vascular Surgery

## 2018-03-22 ENCOUNTER — Inpatient Hospital Stay: Admission: RE | Admit: 2018-03-22 | Payer: Medicare Other | Source: Ambulatory Visit

## 2018-03-22 ENCOUNTER — Other Ambulatory Visit: Payer: Self-pay

## 2018-03-22 DIAGNOSIS — Z01812 Encounter for preprocedural laboratory examination: Secondary | ICD-10-CM | POA: Diagnosis present

## 2018-03-22 LAB — BASIC METABOLIC PANEL
ANION GAP: 12 (ref 5–15)
BUN: 19 mg/dL (ref 8–23)
CALCIUM: 9.9 mg/dL (ref 8.9–10.3)
CO2: 30 mmol/L (ref 22–32)
Chloride: 99 mmol/L (ref 98–111)
Creatinine, Ser: 4.95 mg/dL — ABNORMAL HIGH (ref 0.61–1.24)
GFR, EST AFRICAN AMERICAN: 13 mL/min — AB (ref >60)
GFR, EST NON AFRICAN AMERICAN: 11 mL/min — AB (ref >60)
GLUCOSE: 68 mg/dL — AB (ref 70–99)
POTASSIUM: 3.8 mmol/L (ref 3.5–5.1)
Sodium: 141 mmol/L (ref 135–145)

## 2018-03-22 LAB — CBC WITH DIFFERENTIAL/PLATELET
ABS IMMATURE GRANULOCYTES: 0.05 10*3/uL (ref 0.00–0.07)
BASOS ABS: 0.1 10*3/uL (ref 0.0–0.1)
BASOS PCT: 1 %
EOS ABS: 0.2 10*3/uL (ref 0.0–0.5)
Eosinophils Relative: 2 %
HCT: 42.6 % (ref 39.0–52.0)
Hemoglobin: 12.7 g/dL — ABNORMAL LOW (ref 13.0–17.0)
IMMATURE GRANULOCYTES: 1 %
Lymphocytes Relative: 24 %
Lymphs Abs: 2.3 10*3/uL (ref 0.7–4.0)
MCH: 30.6 pg (ref 26.0–34.0)
MCHC: 29.8 g/dL — ABNORMAL LOW (ref 30.0–36.0)
MCV: 102.7 fL — ABNORMAL HIGH (ref 80.0–100.0)
MONOS PCT: 9 %
Monocytes Absolute: 0.9 10*3/uL (ref 0.1–1.0)
NEUTROS ABS: 5.9 10*3/uL (ref 1.7–7.7)
NEUTROS PCT: 63 %
NRBC: 0 % (ref 0.0–0.2)
PLATELETS: 234 10*3/uL (ref 150–400)
RBC: 4.15 MIL/uL — ABNORMAL LOW (ref 4.22–5.81)
RDW: 17.1 % — AB (ref 11.5–15.5)
WBC: 9.4 10*3/uL (ref 4.0–10.5)

## 2018-03-22 LAB — TYPE AND SCREEN
ABO/RH(D): B NEG
ANTIBODY SCREEN: NEGATIVE

## 2018-03-22 LAB — PROTIME-INR
INR: 1.1
PROTHROMBIN TIME: 14.1 s (ref 11.4–15.2)

## 2018-03-22 LAB — APTT: APTT: 36 s (ref 24–36)

## 2018-03-22 NOTE — Progress Notes (Signed)
Johnny [name] ([id number]) Visit Report for 03/20/2018 Abuse/Suicide Risk Screen Details Patient Name: Johnny [name] Date of Service: 03/20/2018 10:30 AM Medical Record Number: [id number] Patient Account Number: [id number] Date of Birth/Sex: 04-Jul-1950 (67 y.o. M) Treating RN: Cornell Barman Primary Care Taylorann Tkach: Kingsley Spittle Other Clinician: Referring Nastassja Witkop: Kingsley Spittle Treating Jamile Rekowski/Extender: Melburn Hake, HOYT Weeks in Treatment: 0 Abuse/Suicide Risk Screen Items Answer ABUSE/SUICIDE RISK SCREEN: Has anyone close to you tried to hurt or harm you recentlyo No Do you feel uncomfortable with anyone in your familyo No Has anyone forced you do things that you didnot want to doo No Do you have any thoughts of harming yourselfo No Patient displays signs or symptoms of abuse and/or neglect. No Electronic Signature(s) Signed: 03/20/2018 4:48:54 PM By: Gretta Cool, BSN, RN, CWS, Kim RN, BSN Entered By: Gretta Cool, BSN, RN, CWS, Kim on 03/20/2018 10:46:08 Johnny [name] ([id number]) -------------------------------------------------------------------------------- Activities of Daily Living Details Patient Name: Johnny [name] Date of Service: 03/20/2018 10:30 AM Medical Record Number: [id number] Patient Account Number: [id number] Date of Birth/Sex: 1951-04-07 (67 y.o. M) Treating RN: Cornell Barman Primary Care Jerick Khachatryan: Kingsley Spittle Other Clinician: Referring Khale Nigh: Kingsley Spittle Treating Jeanetta Alonzo/Extender: Melburn Hake, HOYT Weeks in Treatment: 0 Activities of Daily Living Items Answer Activities of Daily Living (Please select one for each item) Drive Automobile Not Able Take Medications Completely Able Use Telephone Completely Able Care for Appearance Need Assistance Use Toilet Need Assistance Bath / Shower Need Assistance Dress Self Need Assistance Feed Self Completely Able Walk Need Assistance Get In / Out Bed Need Assistance Housework Need Assistance Prepare Meals Need  Assistance Handle Money Need Assistance Shop for Self Need Assistance Electronic Signature(s) Signed: 03/20/2018 4:48:54 PM By: Gretta Cool, BSN, RN, CWS, Kim RN, BSN Entered By: Gretta Cool, BSN, RN, CWS, Kim on 03/20/2018 10:46:50 Johnny [name] ([id number]) -------------------------------------------------------------------------------- Education Assessment Details Patient Name: Johnny [name] Date of Service: 03/20/2018 10:30 AM Medical Record Number: [id number] Patient Account Number: [id number] Date of Birth/Sex: [birth year] (67 y.o. M) Treating RN: Cornell Barman Primary Care Keiandra Sullenger: Kingsley Spittle Other Clinician: Referring Jerrald Doverspike: Kingsley Spittle Treating Tredarius Cobern/Extender: Melburn Hake, HOYT Weeks in Treatment: 0 Learning Preferences/Education Level/Primary Language Learning Preference: Explanation Highest Education Level: High School Preferred Language: English Cognitive Barrier Assessment/Beliefs Language Barrier: No Translator Needed: No Memory Deficit: No Emotional Barrier: No Cultural/Religious Beliefs Affecting Medical Care: No Physical Barrier Assessment Impaired Vision: Yes Glasses Impaired Hearing: No Decreased Hand dexterity: No Knowledge/Comprehension Assessment Knowledge Level: High Comprehension Level: High Ability to understand written High instructions: Ability to understand verbal High instructions: Motivation Assessment Anxiety Level: Calm Cooperation: Cooperative Education Importance: Acknowledges Need Interest in Health Problems: Asks Questions Perception: Coherent Willingness to Engage in Self- High Management Activities: Readiness to Engage in Self- High Management Activities: Electronic Signature(s) Signed: 03/20/2018 4:48:54 PM By: Gretta Cool, BSN, RN, CWS, Kim RN, BSN Entered By: Gretta Cool, BSN, RN, CWS, Kim on 03/20/2018 10:47:23 Johnny [name] ([id number]) -------------------------------------------------------------------------------- Fall Risk  Assessment Details Patient Name: Johnny [name] Date of Service: 03/20/2018 10:30 AM Medical Record Number: [id number] Patient Account Number: [id number] Date of Birth/Sex: 01-Nov-1950 (67 y.o. M) Treating RN: Cornell Barman Primary Care Annelyse Rey: Kingsley Spittle Other Clinician: Referring Candiss Galeana: Kingsley Spittle Treating Kadeidra Coryell/Extender: Melburn Hake, HOYT Weeks in Treatment: 0 Fall Risk Assessment Items Have you had 2 or more falls in the last 12 monthso 0 No Have you had any fall that resulted in injury in the last 12 monthso 0 No FALL RISK ASSESSMENT: History of falling - immediate or within 3 months 0 No Secondary diagnosis 0  No Ambulatory aid None/bed rest/wheelchair/nurse 0 Yes Crutches/cane/walker 0 No Furniture 0 No IV Access/Saline Lock 0 No Gait/Training Normal/bed rest/immobile 0 No Weak 10 Yes Impaired 0 No Mental Status Oriented to own ability 0 Yes Electronic Signature(s) Signed: 03/20/2018 4:48:54 PM By: Gretta Cool, BSN, RN, CWS, Kim RN, BSN Entered By: Gretta Cool, BSN, RN, CWS, Kim on 03/20/2018 10:47:51 Johnny [name] ([id number]) -------------------------------------------------------------------------------- Foot Assessment Details Patient Name: Johnny [name] Date of Service: 03/20/2018 10:30 AM Medical Record Number: [id number] Patient Account Number: [id number] Date of Birth/Sex: 1950/11/01 (67 y.o. M) Treating RN: Cornell Barman Primary Care Lindley Hiney: Kingsley Spittle Other Clinician: Referring Naren Benally: Kingsley Spittle Treating Bridgette Wolden/Extender: Melburn Hake, HOYT Weeks in Treatment: 0 Foot Assessment Items Site Locations + = Sensation present, - = Sensation absent, C = Callus, U = Ulcer R = Redness, W = Warmth, M = Maceration, PU = Pre-ulcerative lesion F = Fissure, S = Swelling, D = Dryness Assessment Right: Left: Other Deformity: No No Prior Foot Ulcer: No No Prior Amputation: No No Charcot Joint: No No Ambulatory Status: Non-ambulatory Assistance Device:  Wheelchair Gait: Engineer, maintenance) Signed: 03/20/2018 4:48:54 PM By: Gretta Cool, BSN, RN, CWS, Kim RN, BSN Entered By: Gretta Cool, BSN, RN, CWS, Kim on 03/20/2018 10:49:59 Johnny [name] ([id number]) -------------------------------------------------------------------------------- Nutrition Risk Assessment Details Patient Name: Johnny [name] Date of Service: 03/20/2018 10:30 AM Medical Record Number: [id number] Patient Account Number: [id number] Date of Birth/Sex: 1950/10/14 (67 y.o. M) Treating RN: Cornell Barman Primary Care Helina Hullum: Kingsley Spittle Other Clinician: Referring Treena Cosman: Kingsley Spittle Treating Grayson Pfefferle/Extender: Melburn Hake, HOYT Weeks in Treatment: 0 Height (in): 68 Weight (lbs): 137 Body Mass Index (BMI): 20.8 Nutrition Risk Assessment Items NUTRITION RISK SCREEN: I have an illness or condition that made me change the kind and/or amount of 0 No food I eat I eat fewer than two meals per day 0 No I eat few fruits and vegetables, or milk products 2 Yes I have three or more drinks of beer, liquor or wine almost every day 0 No I have tooth or mouth problems that make it hard for me to eat 0 No I don't always have enough money to buy the food I need 0 No I eat alone most of the time 0 No I take three or more different prescribed or over-the-counter drugs a day 1 Yes Without wanting to, I have lost or gained 10 pounds in the last six months 0 No I am not always physically able to shop, cook and/or feed myself 0 No Nutrition Protocols Good Risk Protocol Provide education on elevated blood sugars and Moderate Risk Protocol 0 impact on wound healing, as applicable Electronic Signature(s) Signed: 03/20/2018 4:48:54 PM By: Gretta Cool, BSN, RN, CWS, Kim RN, BSN Entered By: Gretta Cool, BSN, RN, CWS, Kim on 03/20/2018 10:48:33

## 2018-03-22 NOTE — Progress Notes (Addendum)
KHAYMAN, KIRSCH ([id number]) Visit Report for 03/20/2018 Allergy List Details Patient Name: Johnny [name], Johnny [name] Date of Service: 03/20/2018 10:30 AM Medical Record Number: [id number] Patient Account Number: [id number] Date of Birth/Sex: 03-16-1951 (67 y.o. M) Treating RN: Cornell Barman Primary Care Diem Dicocco: Kingsley Spittle Other Clinician: Referring Yashar Inclan: Kingsley Spittle Treating Erek Kowal/Extender: Melburn Hake, HOYT Weeks in Treatment: 0 Allergies Active Allergies shellfish derived Allergy Notes Electronic Signature(s) Signed: 03/20/2018 4:48:54 PM By: Gretta Cool, BSN, RN, CWS, Kim RN, BSN Entered By: Gretta Cool, BSN, RN, CWS, Kim on 03/20/2018 10:34:12 Johnny [name] ([id number]) -------------------------------------------------------------------------------- Arrival Information Details Patient Name: Johnny [name] Date of Service: 03/20/2018 10:30 AM Medical Record Number: [id number] Patient Account Number: [id number] Date of Birth/Sex: [birth year] (67 y.o. M) Treating RN: Montey Hora Primary Care Terril Amaro: Kingsley Spittle Other Clinician: Referring Harman Ferrin: Kingsley Spittle Treating Nicey Krah/Extender: Melburn Hake, HOYT Weeks in Treatment: 0 Visit Information Patient Arrived: Wheel Chair Arrival Time: 10:20 Accompanied By: friend Transfer Assistance: None Patient Identification Verified: Yes Secondary Verification Process Completed: Yes Electronic Signature(s) Signed: 03/20/2018 2:49:09 PM By: Lorine Bears RCP, RRT, CHT Entered By: Lorine Bears on 03/20/2018 10:25:21 Johnny [name] ([id number]) -------------------------------------------------------------------------------- Clinic Level of Care Assessment Details Patient Name: Johnny [name] Date of Service: 03/20/2018 10:30 AM Medical Record Number: [id number] Patient Account Number: [id number] Date of Birth/Sex: [birth year] (67 y.o. M) Treating RN: Montey Hora Primary Care Twila Rappa: Kingsley Spittle  Other Clinician: Referring Edilson Vital: Kingsley Spittle Treating Sherley Leser/Extender: Melburn Hake, HOYT Weeks in Treatment: 0 Clinic Level of Care Assessment Items TOOL 1 Quantity Score []  - Use when EandM and Procedure is performed on INITIAL visit 0 ASSESSMENTS - Nursing Assessment / Reassessment X - General Physical Exam (combine w/ comprehensive assessment (listed just below) when 1 20 performed on new pt. evals) X- 1 25 Comprehensive Assessment (HX, ROS, Risk Assessments, Wounds Hx, etc.) ASSESSMENTS - Wound and Skin Assessment / Reassessment []  - Dermatologic / Skin Assessment (not related to wound area) 0 ASSESSMENTS - Ostomy and/or Continence Assessment and Care []  - Incontinence Assessment and Management 0 []  - 0 Ostomy Care Assessment and Management (repouching, etc.) PROCESS - Coordination of Care X - Simple Patient / Family Education for ongoing care 1 15 []  - 0 Complex (extensive) Patient / Family Education for ongoing care X- 1 10 Staff obtains Programmer, systems, Records, Test Results / Process Orders []  - 0 Staff telephones HHA, Nursing Homes / Clarify orders / etc []  - 0 Routine Transfer to another Facility (non-emergent condition) []  - 0 Routine Hospital Admission (non-emergent condition) X- 1 15 New Admissions / Biomedical engineer / Ordering NPWT, Apligraf, etc. []  - 0 Emergency Hospital Admission (emergent condition) PROCESS - Special Needs []  - Pediatric / Minor Patient Management 0 []  - 0 Isolation Patient Management []  - 0 Hearing / Language / Visual special needs []  - 0 Assessment of Community assistance (transportation, D/C planning, etc.) []  - 0 Additional assistance / Altered mentation []  - 0 Support Surface(s) Assessment (bed, cushion, seat, etc.) Johnny [name], Johnny [name] ([id number]) INTERVENTIONS - Miscellaneous []  - External ear exam 0 []  - 0 Patient Transfer (multiple staff / Civil Service fast streamer / Similar devices) []  - 0 Simple Staple / Suture removal (25 or  less) []  - 0 Complex Staple / Suture removal (26 or more) []  - 0 Hypo/Hyperglycemic Management (do not check if billed separately) []  - 0 Ankle / Brachial Index (ABI) - do not check if billed separately Has the patient been seen at the hospital within the last three years: Yes Total Score: 85 Level Of  Care: New/Established - Level 3 Electronic Signature(s) Signed: 03/20/2018 5:03:03 PM By: Montey Hora Entered By: Montey Hora on 03/20/2018 11:51:54 Johnny [name] ([id number]) -------------------------------------------------------------------------------- Lower Extremity Assessment Details Patient Name: Johnny [name] Date of Service: 03/20/2018 10:30 AM Medical Record Number: [id number] Patient Account Number: [id number] Date of Birth/Sex: [birth year] (67 y.o. M) Treating RN: Cornell Barman Primary Care Taccara Bushnell: Kingsley Spittle Other Clinician: Referring Aydin Hink: Kingsley Spittle Treating Bhavya Grand/Extender: Melburn Hake, HOYT Weeks in Treatment: 0 Vascular Assessment Pulses: Dorsalis Pedis Palpable: [Left:No] Doppler Audible: [Left:Yes] Posterior Tibial Palpable: [Left:No] Doppler Audible: [Left:Yes] Extremity colors, hair growth, and conditions: Extremity Color: [Left:Dusky] Hair Growth on Extremity: [Left:No] Temperature of Extremity: [Left:Warm] Capillary Refill: [Left:> 3 seconds] Toe Nail Assessment Left: Right: Thick: Yes Discolored: Yes Deformed: Yes Improper Length and Hygiene: Yes Notes ABI in EPIC from [facility] Electronic Signature(s) Signed: 03/20/2018 4:48:54 PM By: Gretta Cool, BSN, RN, CWS, Kim RN, BSN Entered By: Gretta Cool, BSN, RN, CWS, Kim on 03/20/2018 11:12:45 Johnny [name] ([id number]) -------------------------------------------------------------------------------- Multi Wound Chart Details Patient Name: Johnny [name] Date of Service: 03/20/2018 10:30 AM Medical Record Number: [id number] Patient Account Number: [id number] Date of Birth/Sex: 1950/06/12 (67  y.o. M) Treating RN: Montey Hora Primary Care Vashaun Osmon: Kingsley Spittle Other Clinician: Referring Andra Matsuo: Kingsley Spittle Treating Orissa Arreaga/Extender: Melburn Hake, HOYT Weeks in Treatment: 0 Vital Signs Height(in): 68 Pulse(bpm): 104 Weight(lbs): 137 Blood Pressure(mmHg): 124/78 Body Mass Index(BMI): 21 Temperature(F): 98.4 Respiratory Rate 16 (breaths/min): Photos: [1:No Photos] [2:No Photos] [N/A:N/A] Wound Location: [1:Left Toe Fourth - Anterior] [2:Left Lower Leg - Posterior] [N/A:N/A] Wounding Event: [1:Gradually Appeared] [2:Gradually Appeared] [N/A:N/A] Primary Etiology: [1:Diabetic Wound/Ulcer of the Lower Extremity] [2:Diabetic Wound/Ulcer of the Lower Extremity] [N/A:N/A] Secondary Etiology: [1:Arterial Insufficiency Ulcer] [2:N/A] [N/A:N/A] Comorbid History: [1:Deep Vein Thrombosis, Hypertension, Peripheral Venous Disease, Type II Diabetes, History of pressure wounds, Gout, Neuropathy] [2:Deep Vein Thrombosis, Hypertension, Peripheral Venous Disease, Type II Diabetes, History of pressure  wounds, Gout, Neuropathy] [N/A:N/A] Date Acquired: [1:09/06/2017] [2:09/06/2017] [N/A:N/A] Weeks of Treatment: [1:0] [2:0] [N/A:N/A] Wound Status: [1:Open] [2:Open] [N/A:N/A] Pending Amputation on [1:Yes] [2:No] [N/A:N/A] Presentation: Measurements L x W x D [1:1x0.9x0.4] [2:2.4x1x0.1] [N/A:N/A] (cm) Area (cm) : [1:0.707] [2:1.885] [N/A:N/A] Volume (cm) : [1:0.283] [2:0.188] [N/A:N/A] Classification: [1:Grade 2] [2:Grade 1] [N/A:N/A] Exudate Amount: [1:Medium] [2:None Present] [N/A:N/A] Exudate Type: [1:Purulent] [2:N/A] [N/A:N/A] Exudate Color: [1:yellow, brown, green] [2:N/A] [N/A:N/A] Wound Margin: [1:Flat and Intact] [2:Indistinct, nonvisible] [N/A:N/A] Granulation Amount: [1:None Present (0%)] [2:Medium (34-66%)] [N/A:N/A] Granulation Quality: [1:N/A] [2:Pink] [N/A:N/A] Necrotic Amount: [1:Large (67-100%)] [2:Medium (34-66%)] [N/A:N/A] Necrotic Tissue: [1:Eschar, Adherent  Slough] [2:Eschar] [N/A:N/A] Exposed Structures: [1:Fat Layer (Subcutaneous Tissue) Exposed: Yes Fascia: No Tendon: No Muscle: No Joint: No Bone: No] [2:Fascia: No Fat Layer (Subcutaneous Tissue) Exposed: No Tendon: No Muscle: No Joint: No Bone: No Limited to Skin Breakdown] [N/A:N/A] Epithelialization: None None N/A Periwound Skin Texture: No Abnormalities Noted Excoriation: No N/A Induration: No Callus: No Crepitus: No Rash: No Scarring: No Periwound Skin Moisture: No Abnormalities Noted Dry/Scaly: Yes N/A Maceration: No Periwound Skin Color: No Abnormalities Noted Atrophie Blanche: No N/A Cyanosis: No Ecchymosis: No Erythema: No Hemosiderin Staining: No Mottled: No Pallor: No Rubor: No Temperature: No Abnormality N/A N/A Tenderness on Palpation: Yes No N/A Wound Preparation: Ulcer Cleansing: Ulcer Cleansing: N/A Rinsed/Irrigated with Saline Rinsed/Irrigated with Saline Topical Anesthetic Applied: Topical Anesthetic Applied: Other: lidocaine 4% Other: lidocaine 4% Treatment Notes Electronic Signature(s) Signed: 03/20/2018 5:03:03 PM By: Montey Hora Entered By: Montey Hora on 03/20/2018 11:30:32 Johnny [name] ([id number]) -------------------------------------------------------------------------------- [place] Details Patient Name: Johnny [name] Date of Service: 03/20/2018  10:30 AM Medical Record Number: [id number] Patient Account Number: [id number] Date of Birth/Sex: 1951-02-28 (67 y.o. M) Treating RN: Montey Hora Primary Care Tavi Hoogendoorn: Kingsley Spittle Other Clinician: Referring Colisha Redler: Kingsley Spittle Treating Detavious Rinn/Extender: Melburn Hake, HOYT Weeks in Treatment: 0 Active Inactive Electronic Signature(s) Signed: 04/04/2018 2:27:05 PM By: Gretta Cool, BSN, RN, CWS, Kim RN, BSN Signed: 07/16/2018 10:46:12 AM By: Montey Hora Previous Signature: 03/20/2018 5:03:03 PM Version By: Montey Hora Entered By: Gretta Cool BSN, RN, CWS, Kim on  04/04/2018 14:27:05 Johnny [name], Johnny [name] ([id number]) -------------------------------------------------------------------------------- Pain Assessment Details Patient Name: Johnny [name] Date of Service: 03/20/2018 10:30 AM Medical Record Number: [id number] Patient Account Number: [id number] Date of Birth/Sex: [birth year] (67 y.o. M) Treating RN: Montey Hora Primary Care Jozie Wulf: Kingsley Spittle Other Clinician: Referring Shatora Weatherbee: Kingsley Spittle Treating Maynor Mwangi/Extender: Melburn Hake, HOYT Weeks in Treatment: 0 Active Problems Location of Pain Severity and Description of Pain Patient Has Paino Yes Site Locations Rate the pain. Current Pain Level: 8 Pain Management and Medication Current Pain Management: Electronic Signature(s) Signed: 03/20/2018 2:49:09 PM By: Lorine Bears RCP, RRT, CHT Signed: 03/20/2018 5:03:03 PM By: Montey Hora Entered By: Lorine Bears on 03/20/2018 10:25:38 Johnny [name], Johnny [name] ([id number]) -------------------------------------------------------------------------------- Patient/Caregiver Education Details Patient Name: Johnny [name] Date of Service: 03/20/2018 10:30 AM Medical Record Number: [id number] Patient Account Number: [id number] Date of Birth/Gender: [birth year] (67 y.o. M) Treating RN: Montey Hora Primary Care Physician: Kingsley Spittle Other Clinician: Referring Physician: Kingsley Spittle Treating Physician/Extender: Sharalyn Ink in Treatment: 0 Education Assessment Education Provided To: Patient and Caregiver Education Topics Provided Wound/Skin Impairment: Handouts: Other: wound care as ordered Methods: Explain/Verbal Responses: State content correctly Electronic Signature(s) Signed: 03/20/2018 5:03:03 PM By: Montey Hora Entered By: Montey Hora on 03/20/2018 11:52:37 Johnny [name] ([id number]) -------------------------------------------------------------------------------- Wound Assessment  Details Patient Name: Johnny [name] Date of Service: 03/20/2018 10:30 AM Medical Record Number: [id number] Patient Account Number: [id number] Date of Birth/Sex: 1950/11/30 (67 y.o. M) Treating RN: Cornell Barman Primary Care Keiyon Plack: Kingsley Spittle Other Clinician: Referring Keylen Uzelac: Kingsley Spittle Treating Eretria Manternach/Extender: Melburn Hake, HOYT Weeks in Treatment: 0 Wound Status Wound Number: 1 Primary Diabetic Wound/Ulcer of the Lower Extremity Etiology: Wound Location: Left Toe Fourth - Anterior Secondary Arterial Insufficiency Ulcer Wounding Event: Gradually Appeared Etiology: Date Acquired: 09/06/2017 Wound Open Weeks Of Treatment: 0 Status: Clustered Wound: No Comorbid Deep Vein Thrombosis, Hypertension, Pending Amputation On Presentation History: Peripheral Venous Disease, Type II Diabetes, History of pressure wounds, Gout, Neuropathy Photos Photo Uploaded By: Gretta Cool, BSN, RN, CWS, Kim on 03/21/2018 11:05:18 Wound Measurements Length: (cm) 1 Width: (cm) 0.9 Depth: (cm) 0.4 Area: (cm) 0.707 Volume: (cm) 0.283 % Reduction in Area: % Reduction in Volume: Epithelialization: None Tunneling: No Undermining: No Wound Description Classification: Grade 2 Foul Odor Wound Margin: Flat and Intact Slough/Fi Exudate Amount: Medium Exudate Type: Purulent Exudate Color: yellow, brown, green After Cleansing: No brino Yes Wound Bed Granulation Amount: None Present (0%) Exposed Structure Necrotic Amount: Large (67-100%) Fascia Exposed: No Necrotic Quality: Eschar, Adherent Slough Fat Layer (Subcutaneous Tissue) Exposed: Yes Tendon Exposed: No Muscle Exposed: No Joint Exposed: No Bone Exposed: No Johnny [name], Johnny [name] ([id number]) Periwound Skin Texture Texture Color No Abnormalities Noted: No No Abnormalities Noted: No Moisture Temperature / Pain No Abnormalities Noted: No Temperature: No Abnormality Tenderness on Palpation: Yes Wound Preparation Ulcer Cleansing:  Rinsed/Irrigated with Saline Topical Anesthetic Applied: Other: lidocaine 4%, Electronic Signature(s) Signed: 03/20/2018 4:48:54 PM By: Gretta Cool, BSN, RN, CWS, Kim RN, BSN Entered By: Gretta Cool, BSN, RN, CWS, Kim on 03/20/2018 11:04:21 Johnny [name] ([id number]) --------------------------------------------------------------------------------  Wound Assessment Details Patient Name: Johnny [name], Johnny [name] Date of Service: 03/20/2018 10:30 AM Medical Record Number: [id number] Patient Account Number: [id number] Date of Birth/Sex: 06-04-1950 (67 y.o. M) Treating RN: Cornell Barman Primary Care Kyuss Hale: Kingsley Spittle Other Clinician: Referring Nedra Mcinnis: Kingsley Spittle Treating Solara Goodchild/Extender: Melburn Hake, HOYT Weeks in Treatment: 0 Wound Status Wound Number: 2 Primary Diabetic Wound/Ulcer of the Lower Extremity Etiology: Wound Location: Left Lower Leg - Posterior Wound Open Wounding Event: Gradually Appeared Status: Date Acquired: 09/06/2017 Comorbid Deep Vein Thrombosis, Hypertension, Weeks Of Treatment: 0 History: Peripheral Venous Disease, Type II Diabetes, Clustered Wound: No History of pressure wounds, Gout, Neuropathy Photos Photo Uploaded By: Gretta Cool, BSN, RN, CWS, Kim on 03/21/2018 11:06:50 Wound Measurements Length: (cm) 2.4 Width: (cm) 1 Depth: (cm) 0.1 Area: (cm) 1.885 Volume: (cm) 0.188 % Reduction in Area: % Reduction in Volume: Epithelialization: None Tunneling: No Undermining: No Wound Description Classification: Grade 1 Foul Odor Wound Margin: Indistinct, nonvisible Slough/Fi Exudate Amount: None Present After Cleansing: No brino No Wound Bed Granulation Amount: Medium (34-66%) Exposed Structure Granulation Quality: Pink Fascia Exposed: No Necrotic Amount: Medium (34-66%) Fat Layer (Subcutaneous Tissue) Exposed: No Necrotic Quality: Eschar Tendon Exposed: No Muscle Exposed: No Joint Exposed: No Bone Exposed: No Limited to Skin Breakdown Periwound Skin  Texture Texture Color Johnny [name], Johnny [name] ([id number]) No Abnormalities Noted: No No Abnormalities Noted: No Callus: No Atrophie Blanche: No Crepitus: No Cyanosis: No Excoriation: No Ecchymosis: No Induration: No Erythema: No Rash: No Hemosiderin Staining: No Scarring: No Mottled: No Pallor: No Moisture Rubor: No No Abnormalities Noted: No Dry / Scaly: Yes Maceration: No Wound Preparation Ulcer Cleansing: Rinsed/Irrigated with Saline Topical Anesthetic Applied: Other: lidocaine 4%, Electronic Signature(s) Signed: 03/20/2018 4:48:54 PM By: Gretta Cool, BSN, RN, CWS, Kim RN, BSN Entered By: Gretta Cool, BSN, RN, CWS, Kim on 03/20/2018 11:06:15 Johnny [name] ([id number]) -------------------------------------------------------------------------------- Vitals Details Patient Name: Johnny [name] Date of Service: 03/20/2018 10:30 AM Medical Record Number: [id number] Patient Account Number: [id number] Date of Birth/Sex: Nov 06, 1950 (67 y.o. M) Treating RN: Montey Hora Primary Care Jovanni Rash: Kingsley Spittle Other Clinician: Referring Devarious Pavek: Kingsley Spittle Treating Alveda Vanhorne/Extender: Melburn Hake, HOYT Weeks in Treatment: 0 Vital Signs Time Taken: 10:20 Temperature (F): 98.4 Height (in): 68 Pulse (bpm): 104 Source: Stated Respiratory Rate (breaths/min): 16 Weight (lbs): 137 Blood Pressure (mmHg): 124/78 Source: Measured Reference Range: 80 - 120 mg / dl Body Mass Index (BMI): 20.8 Electronic Signature(s) Signed: 03/20/2018 2:49:09 PM By: Lorine Bears RCP, RRT, CHT Entered By: Becky Sax, Amado Nash on 03/20/2018 10:29:48

## 2018-03-22 NOTE — Patient Instructions (Signed)
Your procedure is scheduled on: Wednesday, March 28, 2018 Report to Day Surgery on the 2nd floor of the [facility]. To find out your arrival time, please call [phone] between 1PM - 3PM on: Tuesday, March 27, 2018  REMEMBER: Instructions that are not followed completely may result in serious medical risk, up to and including death; or upon the discretion of your surgeon and anesthesiologist your surgery may need to be rescheduled.  Do not eat food after midnight the night before surgery.  No gum chewing, lozengers or hard candies.  You may however, drink CLEAR liquids up to 2 hours before you are scheduled to arrive for your surgery. Do not drink anything within 2 hours of the start of your surgery.  Clear liquids include: - water  - apple juice without pulp - gatorade - black coffee or tea (Do NOT add milk or creamers to the coffee or tea) Do NOT drink anything that is not on this list.  No Alcohol for 24 hours before or after surgery.  No Smoking including e-cigarettes for 24 hours prior to surgery.  No chewable tobacco products for at least 6 hours prior to surgery.  No nicotine patches on the day of surgery.  On the morning of surgery brush your teeth with toothpaste and water, you may rinse your mouth with mouthwash if you wish. Do not swallow any toothpaste or mouthwash.  Notify your doctor if there is any change in your medical condition (cold, fever, infection).  Do not wear jewelry, make-up, hairpins, clips or nail polish.  Do not wear lotions, powders, or perfumes.   Do not shave 48 hours prior to surgery.   Contacts and dentures may not be worn into surgery.  Do not bring valuables to the hospital, including drivers license, insurance or credit cards.  [facility] is not responsible for any belongings or valuables.   TAKE THESE MEDICATIONS THE MORNING OF SURGERY:  1.  Amlodipine 2.  Hydrocodone (if needed for pain)  Use CHG wipes as directed on  instruction sheet.  NOW!  Stop Anti-inflammatories (NSAIDS) such as Advil, Aleve, Ibuprofen, Motrin, Naproxen, Naprosyn and Aspirin based products such as Excedrin, Goodys Powder, BC Powder. (May take Tylenol or Acetaminophen if needed.)  NOW!  Stop ANY OVER THE COUNTER supplements until after surgery. (May continue multivitamin.)  Wear comfortable clothing (specific to your surgery type) to the hospital.  Plan for stool softeners for home use.  If you are being admitted to the hospital overnight, leave your suitcase in the car. After surgery it may be brought to your room.  If you are being discharged the day of surgery, you will not be allowed to drive home. You will need a responsible adult to drive you home and stay with you that night.   If you are taking public transportation, you will need to have a responsible adult with you. Please confirm with your physician that it is acceptable to use public transportation.   Please call [phone] if you have any questions about these instructions.

## 2018-03-22 NOTE — Progress Notes (Signed)
BRENTLEE, DELAGE ([id number]) Visit Report for 03/20/2018 Chief Complaint Document Details Patient Name: Johnny [name], Johnny [name] Date of Service: 03/20/2018 10:30 AM Medical Record Number: [id number] Patient Account Number: [id number] Date of Birth/Sex: 06-24-1950 (67 y.o. M) Treating RN: Montey Hora Primary Care Provider: Kingsley Spittle Other Clinician: Referring Provider: Kingsley Spittle Treating Provider/Extender: Melburn Hake, Samiyah Stupka Weeks in Treatment: 0 Information Obtained from: Patient Chief Complaint Left LE Ulcers Electronic Signature(s) Signed: 03/20/2018 5:50:45 PM By: Worthy Keeler PA-C Entered By: Worthy Keeler on 03/20/2018 17:29:23 ABHISHEK, LEVESQUE ([id number]) -------------------------------------------------------------------------------- Debridement Details Patient Name: Johnny [name] Date of Service: 03/20/2018 10:30 AM Medical Record Number: [id number] Patient Account Number: [id number] Date of Birth/Sex: 1950/06/28 (67 y.o. M) Treating RN: Montey Hora Primary Care Provider: Kingsley Spittle Other Clinician: Referring Provider: Kingsley Spittle Treating Provider/Extender: Melburn Hake, Jabaree Mercado Weeks in Treatment: 0 Debridement Performed for Wound #2 Left,Posterior Lower Leg Assessment: Performed By: Physician STONE III, Judyann Casasola E., PA-C Debridement Type: Debridement Severity of Tissue Pre Fat layer exposed Debridement: Level of Consciousness (Pre- Awake and Alert procedure): Pre-procedure Verification/Time Yes - 11:39 Out Taken: Start Time: 11:39 Pain Control: Lidocaine Total Area Debrided (L x W): 1.2 (cm) x 1.1 (cm) = 1.32 (cm) Tissue and other material Non-Viable, Eschar, Slough, Subcutaneous, Slough debrided: Level: Skin/Subcutaneous Tissue Debridement Description: Excisional Instrument: Curette Bleeding: Minimum Hemostasis Achieved: Pressure End Time: 11:40 Procedural Pain: 3 Post Procedural Pain: 3 Response to Treatment: Procedure was tolerated  well Level of Consciousness Awake and Alert (Post-procedure): Post Debridement Measurements of Total Wound Length: (cm) 1.2 Width: (cm) 1.1 Depth: (cm) 0.2 Volume: (cm) 0.207 Character of Wound/Ulcer Post Debridement: Stable Severity of Tissue Post Debridement: Fat layer exposed Post Procedure Diagnosis Same as Pre-procedure Electronic Signature(s) Signed: 03/20/2018 5:03:03 PM By: Montey Hora Signed: 03/20/2018 5:50:45 PM By: Worthy Keeler PA-C Entered By: Montey Hora on 03/20/2018 11:41:20 Johnny [name] ([id number]) -------------------------------------------------------------------------------- HPI Details Patient Name: Johnny [name] Date of Service: 03/20/2018 10:30 AM Medical Record Number: [id number] Patient Account Number: [id number] Date of Birth/Sex: 1950-11-11 (67 y.o. M) Treating RN: Montey Hora Primary Care Provider: Kingsley Spittle Other Clinician: Referring Provider: Kingsley Spittle Treating Provider/Extender: Melburn Hake, Brittin Janik Weeks in Treatment: 0 History of Present Illness HPI Description: 03/20/18 patient presents today for initial evaluation and our clinic as result of issues that he has been having with his lower extremities. With that being said he actually is a patient of Dr. dew at [facility] as well. Subsequently he is actually scheduled for surgery with Dr. dew next week in regard to his right lower extremity where he does require and need a significant debridement of the posterior/lateral aspect of his leg. For that reason we really are not doing anything today to aid in this regard with treatment and are not establishing care for the right lower extremity since is gonna be undergoing debridement and is under the care of another physician for this. Subsequently part of the surgery on Wednesday is post involve the right fifth toe for amputation. With that being said we did look back in epic and I do not see any evidence of  x-rays that would indicate osteomyelitis at the site. For that reason I did actually get in touch with Dr. Bunnie Domino office and spoke with Joelene Millin the mid-level provider there. Subsequently, it does appear that the fifth toe is scheduled for invitation. With that being said I explained that on my evaluation today it did not appear that the patient has any evidence of infection at the site  and there was no suggestion of gangrene at this time. The toenail was somewhat damaged and likely needs to be trimmed by podiatry. With that being said I do not see any evidence that this likely is going to require invitation unless there is further documentation of osteomyelitis or something worse that I have not seen at this point. She appreciated the information being relayed and states that she will follow-up with Dr. dew in order to check and see what they need to do as far as reevaluating the patient to ensure that nothing needs to be done for that toe was for this application is concerned. I discussed this with the patient today although I have not spoken with Dr. Bunnie Domino office at the time of the appointment this was later in the day. With that being said the patient does have an extensive vascular history including having had a most recent TBI examination on 02/27/18. The revealed that the patient had biphasic waveforms bilaterally in the foot along with a TBI of 0.93 on the right and a TBI of 0.56 on the left. This was obviously good news. His TBI on the left is a bit lower but still sounds to be better than what it was previous when he was being evaluated by Dr. dew. Again that was prior to the stenting. According to the records that were reviewed from the patient's primary care provider and following the history there it appears that the patient has been under the chair of Dr. dew for a number of years even at some point has seen Dr. Delana Meyer as well. He also sees Dr. Ellin Goodie at the [place] [facility]. It does appear that she recently has had the patient on doxycycline due to what was felt to be an infection. The patient was referred to wound care as things were becoming more complicated than what Dr. Quay Burow felt could be handled at her clinic. Lastly upon reviewing some of the notes through epic and the patient's appointments at the emergency department I could not find anything that indicated an x-ray of his right foot at any point in the recent past. All the x-rays actually pointed to the left foot one was November 27, 2017 subsequently this was repeated on 02/28/18. Subsequently this shows that there was subtle osteolysis at the medial head of the fourth proximal phalanx which was suspicious for acute osteomyelitis and was not noted in the July radiographs. The patient does have no other osseous abnormality identified. With that being said I do not see any of the notes reviewed were this has been addressed in particular other than at the emergency department. No MRI has been ordered at this time. Electronic Signature(s) Signed: 03/20/2018 5:50:45 PM By: Worthy Keeler PA-C Entered By: Worthy Keeler on 03/20/2018 17:37:39 Johnny [name], Johnny [name] ([id number]) -------------------------------------------------------------------------------- Physical Exam Details Patient Name: Johnny [name] Date of Service: 03/20/2018 10:30 AM Medical Record Number: [id number] Patient Account Number: [id number] Date of Birth/Sex: 1951-04-02 (67 y.o. M) Treating RN: Montey Hora Primary Care Provider: Kingsley Spittle Other Clinician: Referring Provider: Kingsley Spittle Treating Provider/Extender: Melburn Hake, Asahel Risden Weeks in Treatment: 0 Constitutional sitting or standing blood pressure is within target range for patient.. pulse regular and within target range for patient.[name] respirations regular, non-labored and within target range for patient.[name] temperature within target range for patient..  Well- nourished and well-hydrated in no acute distress. Eyes conjunctiva clear no eyelid edema noted. pupils equal round and reactive to light and accommodation. Ears, Nose,  Mouth, and Throat no gross abnormality of ear auricles or external auditory canals. normal hearing noted during conversation. mucus membranes moist. Respiratory normal breathing without difficulty. clear to auscultation bilaterally. Cardiovascular regular rate and rhythm with normal S1, S2. Absent posterior tibial and dorsalis pedis pulses bilateral lower extremities. trace pitting edema of the bilateral lower extremities. Gastrointestinal (GI) soft, non-tender, non-distended, +BS. no ventral hernia noted. Musculoskeletal unsteady while walking. Psychiatric this patient is able to make decisions and demonstrates good insight into disease process. Alert and Oriented x 3. pleasant and cooperative. Notes Upon inspection today again although we did not establish care for the right lower extremity I do not feel that there's anything going on with the right fifth toe that would warrant or necessitate amputation at this point just based on visual examination. Subsequently in regard to the left fourth toe that is a different scenario where I do believe there's likely osteomyelitis and potentially a reason that amputation could be considered at this site. Especially considering the patient's comorbidities which are gonna make hyperbaric oxygen therapy somewhat unlikely. Nonetheless he probably needs to have an MRI to fully identify how extensive the osteomyelitis may be interviewed from in fact that it is indeed osteomyelitis in this left foot. In regard to the posterior left lower extremity there was slight debridement that I performed to clear away some necrotic material to hopefully aid this in healing appropriately. I think that Santyl would likely be good for this area as an ongoing treatment. Electronic  Signature(s) Signed: 03/20/2018 5:50:45 PM By: Worthy Keeler PA-C Entered By: Worthy Keeler on 03/20/2018 17:40:25 Johnny [name] ([id number]) -------------------------------------------------------------------------------- Physician Orders Details Patient Name: Johnny [name] Date of Service: 03/20/2018 10:30 AM Medical Record Number: [id number] Patient Account Number: [id number] Date of Birth/Sex: 15-Mar-1951 (67 y.o. M) Treating RN: Montey Hora Primary Care Provider: Kingsley Spittle Other Clinician: Referring Provider: Kingsley Spittle Treating Provider/Extender: Melburn Hake, Eh Sesay Weeks in Treatment: 0 Verbal / Phone Orders: No Diagnosis Coding Wound Cleansing Wound #1 Left,Anterior Toe Fourth o Clean wound with Normal Saline. o Cleanse wound with mild soap and water Wound #2 Left,Posterior Lower Leg o Clean wound with Normal Saline. o Cleanse wound with mild soap and water Anesthetic (add to Medication List) Wound #1 Left,Anterior Toe Fourth o Topical Lidocaine 4% cream applied to wound bed prior to debridement (In Clinic Only). Wound #2 Left,Posterior Lower Leg o Topical Lidocaine 4% cream applied to wound bed prior to debridement (In Clinic Only). Primary Wound Dressing Wound #1 Left,Anterior Toe Fourth o Iodoflex Wound #2 Left,Posterior Lower Leg o Santyl Ointment Secondary Dressing Wound #1 Left,Anterior Toe Fourth o Other - coverlet or bandaid Wound #2 Left,Posterior Lower Leg o Boardered Foam Dressing Dressing Change Frequency Wound #1 Left,Anterior Toe Fourth o Change dressing every day. Wound #2 Left,Posterior Lower Leg o Change dressing every day. Home Health Wound #1 [place] Visits - [name] is not currently treating the right leg. Please continue current wound care orders for the right sided wounds. o Home Health Nurse may visit PRN to address patientos wound care  needs. Johnny [name], Johnny [name] ([id number]) o FACE TO FACE ENCOUNTER: MEDICARE and MEDICAID PATIENTS: I certify that this patient is under my care and that I had a face-to-face encounter that meets the physician face-to-face encounter requirements with this patient on this date. The encounter with the patient was in whole or in part for the following MEDICAL CONDITION: (primary reason for [place]) MEDICAL  NECESSITY: I certify, that based on my findings, NURSING services are a medically necessary home health service. HOME BOUND STATUS: I certify that my clinical findings support that this patient is homebound (i.e., Due to illness or injury, pt requires aid of supportive devices such as crutches, cane, wheelchairs, walkers, the use of special transportation or the assistance of another person to leave their place of residence. There is a normal inability to leave the home and doing so requires considerable and taxing effort. Other absences are for medical reasons / religious services and are infrequent or of short duration when for other reasons). o If current dressing causes regression in wound condition, may D/C ordered dressing product/s and apply Normal Saline Moist Dressing daily until next [place] / Other MD appointment. [place] of regression in wound condition at [phone]. o Please direct any NON-WOUND related issues/requests for orders to patient's Primary Care Physician Wound #2 [place] Visits - [facility] is not currently treating the right leg. Please continue current wound care orders for the right sided wounds. o Home Health Nurse may visit PRN to address patientos wound care needs. o FACE TO FACE ENCOUNTER: MEDICARE and MEDICAID PATIENTS: I certify that this patient is under my care and that I had a face-to-face encounter that meets the physician face-to-face encounter requirements  with this patient on this date. The encounter with the patient was in whole or in part for the following MEDICAL CONDITION: (primary reason for [place]) MEDICAL NECESSITY: I certify, that based on my findings, NURSING services are a medically necessary home health service. HOME BOUND STATUS: I certify that my clinical findings support that this patient is homebound (i.e., Due to illness or injury, pt requires aid of supportive devices such as crutches, cane, wheelchairs, walkers, the use of special transportation or the assistance of another person to leave their place of residence. There is a normal inability to leave the home and doing so requires considerable and taxing effort. Other absences are for medical reasons / religious services and are infrequent or of short duration when for other reasons). o If current dressing causes regression in wound condition, may D/C ordered dressing product/s and apply Normal Saline Moist Dressing daily until next [place] / Other MD appointment. [place] of regression in wound condition at [phone]. o Please direct any NON-WOUND related issues/requests for orders to patient's Primary Care Physician Patient Medications Allergies: shellfish derived Notifications Medication Indication Start End Santyl 03/20/2018 DOSE 0 - topical 250 unit/gram ointment - ointment topical applied nickel thick to the wound bed and then cover with a dressing as directed Electronic Signature(s) Signed: 03/20/2018 5:41:36 PM By: Worthy Keeler PA-C Previous Signature: 03/20/2018 5:03:03 PM Version By: Montey Hora Entered By: Worthy Keeler on 03/20/2018 17:41:35 Johnny [name] ([id number]) -------------------------------------------------------------------------------- Problem List Details Patient Name: Johnny [name] Date of Service: 03/20/2018 10:30 AM Medical Record Number: [id number] Patient Account Number:  [id number] Date of Birth/Sex: 1950-10-08 (67 y.o. M) Treating RN: Montey Hora Primary Care Provider: Kingsley Spittle Other Clinician: Referring Provider: Kingsley Spittle Treating Provider/Extender: Melburn Hake, Jarrett Chicoine Weeks in Treatment: 0 Active Problems ICD-10 Evaluated Encounter Code Description Active Date Today Diagnosis E11.621 Type 2 diabetes mellitus with foot ulcer 03/20/2018 No Yes L97.522 Non-pressure chronic ulcer of other part of left foot with fat 03/20/2018 No Yes layer exposed L97.822 Non-pressure chronic ulcer of other part of left lower leg with 03/20/2018 No  Yes fat layer exposed N18.6 End stage renal disease 03/20/2018 No Yes Z99.2 Dependence on renal dialysis 03/20/2018 No Yes I73.89 Other specified peripheral vascular diseases 03/20/2018 No Yes Z79.01 Long term (current) use of anticoagulants 03/20/2018 No Yes Inactive Problems Resolved Problems Electronic Signature(s) Signed: 03/20/2018 5:50:45 PM By: Worthy Keeler PA-C Entered By: Worthy Keeler on 03/20/2018 17:29:02 Johnny [name] ([id number]) -------------------------------------------------------------------------------- Progress Note Details Patient Name: Johnny [name] Date of Service: 03/20/2018 10:30 AM Medical Record Number: [id number] Patient Account Number: [id number] Date of Birth/Sex: 24-Apr-1951 (67 y.o. M) Treating RN: Montey Hora Primary Care Provider: Kingsley Spittle Other Clinician: Referring Provider: Kingsley Spittle Treating Provider/Extender: Melburn Hake, Jayleah Garbers Weeks in Treatment: 0 Subjective Chief Complaint Information obtained from Patient Left LE Ulcers History of Present Illness (HPI) 03/20/18 patient presents today for initial evaluation and our clinic as result of issues that he has been having with his lower extremities. With that being said he actually is a patient of Dr. dew at [facility] as well. Subsequently he is actually scheduled for surgery with Dr.  dew next week in regard to his right lower extremity where he does require and need a significant debridement of the posterior/lateral aspect of his leg. For that reason we really are not doing anything today to aid in this regard with treatment and are not establishing care for the right lower extremity since is gonna be undergoing debridement and is under the care of another physician for this. Subsequently part of the surgery on Wednesday is post involve the right fifth toe for amputation. With that being said we did look back in epic and I do not see any evidence of x-rays that would indicate osteomyelitis at the site. For that reason I did actually get in touch with Dr. Bunnie Domino office and spoke with Joelene Millin the mid-level provider there. Subsequently, it does appear that the fifth toe is scheduled for invitation. With that being said I explained that on my evaluation today it did not appear that the patient has any evidence of infection at the site and there was no suggestion of gangrene at this time. The toenail was somewhat damaged and likely needs to be trimmed by podiatry. With that being said I do not see any evidence that this likely is going to require invitation unless there is further documentation of osteomyelitis or something worse that I have not seen at this point. She appreciated the information being relayed and states that she will follow-up with Dr. dew in order to check and see what they need to do as far as reevaluating the patient to ensure that nothing needs to be done for that toe was for this application is concerned. I discussed this with the patient today although I have not spoken with Dr. Bunnie Domino office at the time of the appointment this was later in the day. With that being said the patient does have an extensive vascular history including having had a most recent TBI examination on 02/27/18. The revealed that the patient had biphasic waveforms bilaterally in the foot  along with a TBI of 0.93 on the right and a TBI of 0.56 on the left. This was obviously good news. His TBI on the left is a bit lower but still sounds to be better than what it was previous when he was being evaluated by Dr. dew. Again that was prior to the stenting. According to the records that were reviewed from the patient's primary care provider and following the history  there it appears that the patient has been under the chair of Dr. dew for a number of years even at some point has seen Dr. Delana Meyer as well. He also sees Dr. Ellin Goodie at the [facility]. It does appear that she recently has had the patient on doxycycline due to what was felt to be an infection. The patient was referred to wound care as things were becoming more complicated than what Dr. Quay Burow felt could be handled at her clinic. Lastly upon reviewing some of the notes through epic and the patient's appointments at the emergency department I could not find anything that indicated an x-ray of his right foot at any point in the recent past. All the x-rays actually pointed to the left foot one was November 27, 2017 subsequently this was repeated on 02/28/18. Subsequently this shows that there was subtle osteolysis at the medial head of the fourth proximal phalanx which was suspicious for acute osteomyelitis and was not noted in the July radiographs. The patient does have no other osseous abnormality identified. With that being said I do not see any of the notes reviewed were this has been addressed in particular other than at the emergency department. No MRI has been ordered at this time. Wound History Patient presents with 4 open wounds that have been present for approximately 5 months. Patient has been treating wounds in the following manner: wet to dry. Laboratory tests have been performed in the last month. Patient reportedly has not tested positive for an antibiotic resistant organism. Patient  reportedly has not tested positive for osteomyelitis. Patient reportedly has had testing performed to evaluate circulation in the legs. Patient experiences the following problems associated with their SAGE, KOPERA ([id number]) wounds: infection. Patient History Information obtained from Patient, Caregiver, Chart. Allergies shellfish derived Family History Diabetes - Siblings, Hypertension - Siblings, Kidney Disease - Siblings, No family history of Cancer, Heart Disease, Lung Disease, Seizures, Stroke, Thyroid Problems, Tuberculosis. Social History Former smoker, Marital Status - Divorced, Alcohol Use - Never, Drug Use - No History, Caffeine Use - Rarely. Medical History Eyes Denies history of Cataracts, Glaucoma, Optic Neuritis Ear/Nose/Mouth/Throat Denies history of Chronic sinus problems/congestion, Middle ear problems Hematologic/Lymphatic Denies history of Anemia, Hemophilia, Human Immunodeficiency Virus, Lymphedema, Sickle Cell Disease Respiratory Denies history of Aspiration, Asthma, Chronic Obstructive Pulmonary Disease (COPD), Pneumothorax, Sleep Apnea, Tuberculosis Cardiovascular Patient has history of Deep Vein Thrombosis, Hypertension, Peripheral Venous Disease Denies history of Angina, Arrhythmia, Congestive Heart Failure, Coronary Artery Disease, Hypotension, Myocardial Infarction, Peripheral Arterial Disease, Phlebitis, Vasculitis Gastrointestinal Denies history of Cirrhosis , Colitis, Crohn s, Hepatitis A, Hepatitis B, Hepatitis C Endocrine Patient has history of Type II Diabetes Denies history of Type I Diabetes Immunological Denies history of Lupus Erythematosus, Raynaud s, Scleroderma Integumentary (Skin) Patient has history of History of pressure wounds Denies history of History of Burn Musculoskeletal Patient has history of Gout Denies history of Rheumatoid Arthritis, Osteoarthritis, Osteomyelitis Neurologic Patient has history of Neuropathy Denies  history of Dementia, Quadriplegia, Paraplegia, Seizure Disorder Oncologic Denies history of Received Chemotherapy, Received Radiation Psychiatric Denies history of Anorexia/bulimia, Confinement Anxiety Patient is treated with Oral Agents. Hospitalization/Surgery History - 01/29/2018, [facility], Review of Systems (ROS) Constitutional Symptoms (General Health) Complains or has symptoms of Fatigue, Fever, Chills, Marked Weight Change - 47 lbs. Eyes Complains or has symptoms of Glasses / Contacts. Johnny [name], Johnny [name] ([id number]) Denies complaints or symptoms of Dry Eyes, Vision Changes. Ear/Nose/Mouth/Throat The patient has no complaints or symptoms. Hematologic/Lymphatic The patient has  no complaints or symptoms. Respiratory The patient has no complaints or symptoms. Cardiovascular Complains or has symptoms of LE edema - Right. Denies complaints or symptoms of Chest pain. Gastrointestinal The patient has no complaints or symptoms. Endocrine The patient has no complaints or symptoms. Immunological Complains or has symptoms of Itching. Integumentary (Skin) Complains or has symptoms of Wounds, Bleeding or bruising tendency. Denies complaints or symptoms of Breakdown, Swelling. Musculoskeletal Complains or has symptoms of Muscle Weakness. Neurologic The patient has no complaints or symptoms. Oncologic The patient has no complaints or symptoms. Psychiatric Complains or has symptoms of Anxiety. Objective Constitutional sitting or standing blood pressure is within target range for patient.. pulse regular and within target range for patient.[name] respirations regular, non-labored and within target range for patient.[name] temperature within target range for patient.. Well- nourished and well-hydrated in no acute distress. Vitals Time Taken: 10:20 AM, Height: 68 in, Source: Stated, Weight: 137 lbs, Source: Measured, BMI: 20.8, Temperature: 98.4 F, Pulse: 104 bpm, Respiratory Rate: 16 breaths/min,  Blood Pressure: 124/78 mmHg. Eyes conjunctiva clear no eyelid edema noted. pupils equal round and reactive to light and accommodation. Ears, Nose, Mouth, and Throat no gross abnormality of ear auricles or external auditory canals. normal hearing noted during conversation. mucus membranes moist. Respiratory normal breathing without difficulty. clear to auscultation bilaterally. Cardiovascular regular rate and rhythm with normal S1, S2. Absent posterior tibial and dorsalis pedis pulses bilateral lower extremities. trace Johnny [name], Johnny [name] ([id number]) pitting edema of the bilateral lower extremities. Gastrointestinal (GI) soft, non-tender, non-distended, +BS. no ventral hernia noted. Musculoskeletal unsteady while walking. Psychiatric this patient is able to make decisions and demonstrates good insight into disease process. Alert and Oriented x 3. pleasant and cooperative. General Notes: Upon inspection today again although we did not establish care for the right lower extremity I do not feel that there's anything going on with the right fifth toe that would warrant or necessitate amputation at this point just based on visual examination. Subsequently in regard to the left fourth toe that is a different scenario where I do believe there's likely osteomyelitis and potentially a reason that amputation could be considered at this site. Especially considering the patient's comorbidities which are gonna make hyperbaric oxygen therapy somewhat unlikely. Nonetheless he probably needs to have an MRI to fully identify how extensive the osteomyelitis may be interviewed from in fact that it is indeed osteomyelitis in this left foot. In regard to the posterior left lower extremity there was slight debridement that I performed to clear away some necrotic material to hopefully aid this in healing appropriately. I think that Santyl would likely be good for this area as an ongoing treatment. Integumentary  (Hair, Skin) Wound #1 status is Open. Original cause of wound was Gradually Appeared. The wound is located on the Left,Anterior Toe Fourth. The wound measures 1cm length x 0.9cm width x 0.4cm depth; 0.707cm^2 area and 0.283cm^3 volume. There is Fat Layer (Subcutaneous Tissue) Exposed exposed. There is no tunneling or undermining noted. There is a medium amount of purulent drainage noted. The wound margin is flat and intact. There is no granulation within the wound bed. There is a large (67-100%) amount of necrotic tissue within the wound bed including Eschar and Adherent Slough. Periwound temperature was noted as No Abnormality. The periwound has tenderness on palpation. Wound #2 status is Open. Original cause of wound was Gradually Appeared. The wound is located on the Left,Posterior Lower Leg. The wound measures 2.4cm length x 1cm width x 0.1cm depth;  1.885cm^2 area and 0.188cm^3 volume. The wound is limited to skin breakdown. There is no tunneling or undermining noted. There is a none present amount of drainage noted. The wound margin is indistinct and nonvisible. There is medium (34-66%) pink granulation within the wound bed. There is a medium (34-66%) amount of necrotic tissue within the wound bed including Eschar. The periwound skin appearance exhibited: Dry/Scaly. The periwound skin appearance did not exhibit: Callus, Crepitus, Excoriation, Induration, Rash, Scarring, Maceration, Atrophie Blanche, Cyanosis, Ecchymosis, Hemosiderin Staining, Mottled, Pallor, Rubor, Erythema. 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 There was a Excisional Skin/Subcutaneous Tissue Debridement with a total area of 1.32 sq cm performed by STONE III, Greidys Deland E., PA-C. With the following instrument(s): Curette to remove Non-Viable tissue/material. Material removed includes Eschar, Subcutaneous Tissue, and Slough after achieving pain control using Lidocaine. No specimens were taken. A time out was conducted at 11:39, prior to the start of the procedure. A Minimum amount of bleeding was controlled with Pressure. The procedure was tolerated well with a pain level of 3 throughout and a pain level of 3 following the procedure. Post Debridement Measurements: 1.2cm length x 1.1cm width x 0.2cm depth; 0.207cm^3 volume. Character of Wound/Ulcer Post Debridement is stable. Severity of Tissue Post Debridement is: Fat layer exposed. Post procedure Diagnosis Wound #2: Same as Pre-Procedure Plan Wound Cleansing: Wound #1 Left,Anterior Toe Fourth: Clean wound with Normal Saline. Cleanse wound with mild soap and water Wound #2 Left,Posterior Lower Leg: Clean wound with Normal Saline. Cleanse wound with mild soap and water Anesthetic (add to Medication List): Wound #1 Left,Anterior Toe Fourth: Topical Lidocaine 4% cream applied to wound bed prior to debridement (In Clinic Only). Wound #2 Left,Posterior Lower Leg: Topical Lidocaine 4% cream applied to wound bed prior to debridement (In Clinic Only). Primary Wound Dressing: Wound #1 Left,Anterior Toe Fourth: Iodoflex Wound #2 Left,Posterior Lower Leg: Santyl Ointment Secondary Dressing: Wound #1 Left,Anterior Toe Fourth: Other - coverlet or bandaid Wound #2 Left,Posterior Lower Leg: Boardered Foam Dressing Dressing Change Frequency: Wound #1 Left,Anterior Toe  Fourth: Change dressing every day. Wound #2 Left,Posterior Lower Leg: Change dressing every day. Home Health: Wound #1 Left,Anterior Toe Fourth: [place] [facility]its - [facility] is not currently treating the right leg. Please continue current wound care orders for the right sided wounds. Home Health Nurse may visit PRN to address patient s wound care needs. FACE TO FACE ENCOUNTER: MEDICARE and MEDICAID PATIENTS: I certify that this patient is under my care and that I had a face-to-face encounter that meets the physician face-to-face encounter requirements with this patient on this date. The Johnny [name], Johnny [name] ([id number]) encounter with the patient was in whole or in part for the following MEDICAL CONDITION: (primary reason for [place]) MEDICAL NECESSITY: I certify, that based on my findings, NURSING services are a medically necessary home health service. HOME BOUND STATUS: I certify that my clinical findings support that this patient is homebound (i.e., Due to illness or injury, pt requires aid  of supportive devices such as crutches, cane, wheelchairs, walkers, the use of special transportation or the assistance of another person to leave their place of residence. There is a normal inability to leave the home and doing so requires considerable and taxing effort. Other absences are for medical reasons / religious services and are infrequent or of short duration when for other reasons). If current dressing causes regression in wound condition, may D/C ordered dressing product/s and apply Normal Saline Moist Dressing daily until next [place] / Other MD appointment. [place] of regression in wound condition at [phone]. Please direct any NON-WOUND related issues/requests for orders to patient's Primary Care Physician Wound #2 Left,Posterior Lower Leg: [place] [facility] - [facility] is not currently treating  the right leg. Please continue current wound care orders for the right sided wounds. Home Health Nurse may visit PRN to address patient s wound care needs. FACE TO FACE ENCOUNTER: MEDICARE and MEDICAID PATIENTS: I certify that this patient is under my care and that I had a face-to-face encounter that meets the physician face-to-face encounter requirements with this patient on this date. The encounter with the patient was in whole or in part for the following MEDICAL CONDITION: (primary reason for [place]) MEDICAL NECESSITY: I certify, that based on my findings, NURSING services are a medically necessary home health service. HOME BOUND STATUS: I certify that my clinical findings support that this patient is homebound (i.e., Due to illness or injury, pt requires aid of supportive devices such as crutches, cane, wheelchairs, walkers, the use of special transportation or the assistance of another person to leave their place of residence. There is a normal inability to leave the home and doing so requires considerable and taxing effort. Other absences are for medical reasons / religious services and are infrequent or of short duration when for other reasons). If current dressing causes regression in wound condition, may D/C ordered dressing product/s and apply Normal Saline Moist Dressing daily until next [place] / Other MD appointment. [place] of regression in wound condition at [phone]. Please direct any NON-WOUND related issues/requests for orders to patient's Primary Care Physician The following medication(s) was prescribed: Santyl topical 250 unit/gram ointment 0 ointment topical applied nickel thick to the wound bed and then cover with a dressing as directed starting 03/20/2018 Currently my suggestion is going to be that we initiate the above wound care measures for the left lower extremity. I am going to continue to follow and see how things fall out  as far as our treatment plan is concerned with [facility] as well. I do believe they are going to reevaluate whether or not the right fit toe requires equitation which appears most likely does not again unless there is some and Romie Minus rather studies that I do not have access to the indicate otherwise. However that does not seem to be the case. In regard to the left fourth toe that may need to be considered as well as far as invitation is concerned but again I'm not sure how quickly that may or may not be undertaken. I'm gonna see were things stand next week. Please see above for specific wound care orders. We will see patient for re-evaluation in 1 week(s) here in the clinic. If anything worsens or changes patient will contact our office for additional recommendations. Electronic Signature(s) Signed: 03/20/2018 5:50:45 PM By: Worthy Keeler PA-C Entered By: Melburn Hake,  Zakhai Meisinger on 03/20/2018 17:42:34 Johnny [name], Johnny [name] ([id number]) -------------------------------------------------------------------------------- ROS/PFSH Details Patient Name: Johnny [name], Johnny [name] Date of Service: 03/20/2018 10:30 AM Medical Record Number: [id number] Patient Account Number: [id number] Date of Birth/Sex: [birth year] (67 y.o. M) Treating RN: Cornell Barman Primary Care Provider: Kingsley Spittle Other Clinician: Referring Provider: Kingsley Spittle Treating Provider/Extender: Melburn Hake, Emrick Hensch Weeks in Treatment: 0 Information Obtained From Patient Caregiver Chart Wound History Do you currently have one or more open woundso Yes How many open wounds do you currently haveo 4 Approximately how long have you had your woundso 5 months How have you been treating your wound(s) until nowo wet to dry Has your wound(s) ever healed and then re-openedo No Have you had any lab work done in the past montho Yes Have you tested positive for an antibiotic resistant organism (MRSA, VRE)o No Have you tested positive for osteomyelitis  (bone infection)o No Have you had any tests for circulation on your legso Yes Who ordered the [facility] Where was the test doneo [facility] Have you had other problems associated with your woundso Infection Constitutional Symptoms (General Health) Complaints and Symptoms: Positive for: Fatigue; Fever; Chills; Marked Weight Change - 47 lbs Eyes Complaints and Symptoms: Positive for: Glasses / Contacts Negative for: Dry Eyes; Vision Changes Medical History: Negative for: Cataracts; Glaucoma; Optic Neuritis Cardiovascular Complaints and Symptoms: Positive for: LE edema - Right Negative for: Chest pain Medical History: Positive for: Deep Vein Thrombosis; Hypertension; Peripheral Venous Disease Negative for: Angina; Arrhythmia; Congestive Heart Failure; Coronary Artery Disease; Hypotension; Myocardial Infarction; Peripheral Arterial Disease; Phlebitis; Vasculitis Gastrointestinal Complaints and Symptoms: No Complaints or Symptoms Complaints and Symptoms: Negative for: Frequent diarrhea; Nausea; Vomiting Givans, Oley ([id number]) Medical History: Negative for: Cirrhosis ; Colitis; Crohnos; Hepatitis A; Hepatitis B; Hepatitis C Endocrine Complaints and Symptoms: No Complaints or Symptoms Complaints and Symptoms: Negative for: Hepatitis; Thyroid disease; Polydypsia (Excessive Thirst) Medical History: Positive for: Type II Diabetes Negative for: Type I Diabetes Treated with: Oral agents Immunological Complaints and Symptoms: Positive for: Itching Medical History: Negative for: Lupus Erythematosus; Raynaudos; Scleroderma Integumentary (Skin) Complaints and Symptoms: Positive for: Wounds; Bleeding or bruising tendency Negative for: Breakdown; Swelling Medical History: Positive for: History of pressure wounds Negative for: History of Burn Musculoskeletal Complaints and Symptoms: Positive for: Muscle Weakness Medical History: Positive for: Gout Negative for: Rheumatoid  Arthritis; Osteoarthritis; Osteomyelitis Psychiatric Complaints and Symptoms: Positive for: Anxiety Medical History: Negative for: Anorexia/bulimia; Confinement Anxiety Ear/Nose/Mouth/Throat Complaints and Symptoms: No Complaints or Symptoms Medical History: Negative for: Chronic sinus problems/congestion; Middle ear problems Egle, Thai ([id number]) Hematologic/Lymphatic Complaints and Symptoms: No Complaints or Symptoms Medical History: Negative for: Anemia; Hemophilia; Human Immunodeficiency Virus; Lymphedema; Sickle Cell Disease Respiratory Complaints and Symptoms: No Complaints or Symptoms Medical History: Negative for: Aspiration; Asthma; Chronic Obstructive Pulmonary Disease (COPD); Pneumothorax; Sleep Apnea; Tuberculosis Neurologic Complaints and Symptoms: No Complaints or Symptoms Medical History: Positive for: Neuropathy Negative for: Dementia; Quadriplegia; Paraplegia; Seizure Disorder Oncologic Complaints and Symptoms: No Complaints or Symptoms Medical History: Negative for: Received Chemotherapy; Received Radiation Immunizations Pneumococcal Vaccine: Received Pneumococcal Vaccination: Yes Implantable Devices Hospitalization / Surgery History Name of Hospital Purpose of Hospitalization/Surgery Date [facility] 01/29/2018 Family and Social History Cancer: No; Diabetes: Yes - Siblings; Heart Disease: No; Hypertension: Yes - Siblings; Kidney Disease: Yes - Siblings; Lung Disease: No; Seizures: No; Stroke: No; Thyroid Problems: No; Tuberculosis: No; Former smoker; Marital Status - Divorced; Alcohol Use: Never; Drug Use: No History; Caffeine Use: Rarely; Advanced Directives: Yes (Not Provided); Patient does not want information on Advanced Directives;  Do not resuscitate: No; Living Will: Yes; Medical Power of Attorney: Yes Electronic Signature(s) Signed: 03/20/2018 4:48:54 PM By: Gretta Cool, BSN, RN, CWS, Kim RN, BSN Signed: 03/20/2018 5:50:45 PM By: Worthy Keeler  PA-C Entered By: Gretta Cool, BSN, RN, CWS, Kim on 03/20/2018 10:45:48 Johnny [name] ([id number]) -------------------------------------------------------------------------------- SuperBill Details Patient Name: Johnny [name] Date of Service: 03/20/2018 Medical Record Number: [id number] Patient Account Number: [id number] Date of Birth/Sex: November 27, 1950 (67 y.o. M) Treating RN: Montey Hora Primary Care Provider: Kingsley Spittle Other Clinician: Referring Provider: Kingsley Spittle Treating Provider/Extender: Melburn Hake, Cambrie Sonnenfeld Weeks in Treatment: 0 Diagnosis Coding ICD-10 Codes Code Description E11.621 Type 2 diabetes mellitus with foot ulcer L97.522 Non-pressure chronic ulcer of other part of left foot with fat layer exposed L97.822 Non-pressure chronic ulcer of other part of left lower leg with fat layer exposed N18.6 End stage renal disease Z99.2 Dependence on renal dialysis I73.89 Other specified peripheral vascular diseases Z79.01 Long term (current) use of anticoagulants Facility Procedures CPT4 Code Description: 41937902 99213 - WOUND CARE VISIT-LEV 3 EST PT Modifier: Quantity: 1 CPT4 Code Description: [id number] 11042 - DEB SUBQ TISSUE 20 SQ CM/< ICD-10 Diagnosis Description L97.822 Non-pressure chronic ulcer of other part of left lower leg with Modifier: fat layer expos Quantity: 1 ed Physician Procedures CPT4 Code Description: [id number] 34196 - WC PHYS LEVEL 4 - NEW PT ICD-10 Diagnosis Description E11.621 Type 2 diabetes mellitus with foot ulcer L97.522 Non-pressure chronic ulcer of other part of left foot with fat L97.822 Non-pressure chronic ulcer of  other part of left lower leg with N18.6 End stage renal disease Modifier: 25 layer exposed fat layer expos Quantity: 1 ed CPT4 Code Description: [id number] 92119 - WC PHYS SUBQ TISS 20 SQ CM ICD-10 Diagnosis Description L97.822 Non-pressure chronic ulcer of other part of left lower leg with Modifier: fat layer expos Quantity: 1  ed Electronic Signature(s) Signed: 03/20/2018 5:50:45 PM By: Worthy Keeler PA-C Entered By: Worthy Keeler on 03/20/2018 17:42:51

## 2018-03-26 DIAGNOSIS — T8241XA Breakdown (mechanical) of vascular dialysis catheter, initial encounter: Secondary | ICD-10-CM | POA: Insufficient documentation

## 2018-03-28 ENCOUNTER — Encounter: Admission: RE | Payer: Self-pay | Source: Ambulatory Visit

## 2018-03-28 ENCOUNTER — Telehealth (INDEPENDENT_AMBULATORY_CARE_PROVIDER_SITE_OTHER): Payer: Self-pay

## 2018-03-28 ENCOUNTER — Inpatient Hospital Stay: Admission: RE | Admit: 2018-03-28 | Payer: Medicare Other | Source: Ambulatory Visit | Admitting: Vascular Surgery

## 2018-03-28 SURGERY — DEBRIDEMENT, WOUND
Anesthesia: General | Laterality: Right

## 2018-03-28 NOTE — Telephone Encounter (Signed)
Spoke to the patient's wife and she wants to know if the amputation and debridement is going to be rescheduled? She says that they will make it to the appointment for the angiogram on Monday, but that he was scheduled for two totally different procedures and this is concerning her. She is awaiting a phone call about rescheduling?

## 2018-03-28 NOTE — Telephone Encounter (Signed)
Patient can arrive at his scheduled time for his angiogram.

## 2018-03-28 NOTE — Telephone Encounter (Signed)
Patient called and stated that they will be here for their scheduled appointment on Monday at 8:15, but she would like to know if the appointment that they missed today will be rescheduled?  I looked at the appointment screen and it looks like she's referring to the Pre-admit testing, they missed that today.

## 2018-03-29 NOTE — Telephone Encounter (Signed)
Spoke with the patient's advocate [name] and she was informed that he will be rescheduled for his surgery after his procedure on 04/02/18.

## 2018-04-01 MED ORDER — CEFAZOLIN SODIUM-DEXTROSE 2-4 GM/100ML-% IV SOLN
2.0000 g | INTRAVENOUS | Status: AC
  Administered 2018-04-02: 2 g via INTRAVENOUS

## 2018-04-02 ENCOUNTER — Other Ambulatory Visit: Payer: Self-pay

## 2018-04-02 ENCOUNTER — Other Ambulatory Visit (INDEPENDENT_AMBULATORY_CARE_PROVIDER_SITE_OTHER): Payer: Self-pay | Admitting: Nurse Practitioner

## 2018-04-02 ENCOUNTER — Ambulatory Visit
Admission: RE | Admit: 2018-04-02 | Discharge: 2018-04-02 | Disposition: A | Discharge status: Home / Self Care | Payer: Medicare Other | Source: Ambulatory Visit | Attending: Vascular Surgery | Admitting: Vascular Surgery

## 2018-04-02 ENCOUNTER — Encounter
Admission: RE | Disposition: A | Discharge status: Home / Self Care | Payer: Self-pay | Source: Ambulatory Visit | Attending: Vascular Surgery

## 2018-04-02 ENCOUNTER — Telehealth (INDEPENDENT_AMBULATORY_CARE_PROVIDER_SITE_OTHER): Payer: Self-pay

## 2018-04-02 DIAGNOSIS — E785 Hyperlipidemia, unspecified: Secondary | ICD-10-CM

## 2018-04-02 DIAGNOSIS — Z8249 Family history of ischemic heart disease and other diseases of the circulatory system: Secondary | ICD-10-CM

## 2018-04-02 DIAGNOSIS — R6510 Systemic inflammatory response syndrome (SIRS) of non-infectious origin without acute organ dysfunction: Secondary | ICD-10-CM | POA: Diagnosis not present

## 2018-04-02 DIAGNOSIS — I509 Heart failure, unspecified: Secondary | ICD-10-CM | POA: Insufficient documentation

## 2018-04-02 DIAGNOSIS — N19 Unspecified kidney failure: Secondary | ICD-10-CM | POA: Diagnosis not present

## 2018-04-02 DIAGNOSIS — I132 Hypertensive heart and chronic kidney disease with heart failure and with stage 5 chronic kidney disease, or end stage renal disease: Secondary | ICD-10-CM | POA: Insufficient documentation

## 2018-04-02 DIAGNOSIS — Z91013 Allergy to seafood: Secondary | ICD-10-CM

## 2018-04-02 DIAGNOSIS — M869 Osteomyelitis, unspecified: Secondary | ICD-10-CM

## 2018-04-02 DIAGNOSIS — Z955 Presence of coronary angioplasty implant and graft: Secondary | ICD-10-CM

## 2018-04-02 DIAGNOSIS — M109 Gout, unspecified: Secondary | ICD-10-CM

## 2018-04-02 DIAGNOSIS — L97518 Non-pressure chronic ulcer of other part of right foot with other specified severity: Secondary | ICD-10-CM

## 2018-04-02 DIAGNOSIS — E11621 Type 2 diabetes mellitus with foot ulcer: Secondary | ICD-10-CM | POA: Insufficient documentation

## 2018-04-02 DIAGNOSIS — Z9889 Other specified postprocedural states: Secondary | ICD-10-CM

## 2018-04-02 DIAGNOSIS — Z833 Family history of diabetes mellitus: Secondary | ICD-10-CM

## 2018-04-02 DIAGNOSIS — Z992 Dependence on renal dialysis: Secondary | ICD-10-CM | POA: Insufficient documentation

## 2018-04-02 DIAGNOSIS — N186 End stage renal disease: Secondary | ICD-10-CM

## 2018-04-02 DIAGNOSIS — L97413 Non-pressure chronic ulcer of right heel and midfoot with necrosis of muscle: Secondary | ICD-10-CM

## 2018-04-02 DIAGNOSIS — I70219 Atherosclerosis of native arteries of extremities with intermittent claudication, unspecified extremity: Secondary | ICD-10-CM

## 2018-04-02 DIAGNOSIS — Z87891 Personal history of nicotine dependence: Secondary | ICD-10-CM | POA: Insufficient documentation

## 2018-04-02 DIAGNOSIS — I70202 Unspecified atherosclerosis of native arteries of extremities, left leg: Secondary | ICD-10-CM

## 2018-04-02 DIAGNOSIS — E1122 Type 2 diabetes mellitus with diabetic chronic kidney disease: Secondary | ICD-10-CM | POA: Insufficient documentation

## 2018-04-02 DIAGNOSIS — L97418 Non-pressure chronic ulcer of right heel and midfoot with other specified severity: Secondary | ICD-10-CM

## 2018-04-02 DIAGNOSIS — E1151 Type 2 diabetes mellitus with diabetic peripheral angiopathy without gangrene: Secondary | ICD-10-CM

## 2018-04-02 DIAGNOSIS — I70292 Other atherosclerosis of native arteries of extremities, left leg: Secondary | ICD-10-CM | POA: Insufficient documentation

## 2018-04-02 HISTORY — PX: LOWER EXTREMITY ANGIOGRAPHY: CATH118251

## 2018-04-02 LAB — POTASSIUM (ARMC VASCULAR LAB ONLY): Potassium (ARMC vascular lab): 4.8 (ref 3.5–5.1)

## 2018-04-02 SURGERY — LOWER EXTREMITY ANGIOGRAPHY
Anesthesia: Moderate Sedation | Laterality: Left

## 2018-04-02 MED ORDER — MIDAZOLAM HCL 5 MG/5ML IJ SOLN
INTRAMUSCULAR | Status: AC
  Filled 2018-04-02: qty 5

## 2018-04-02 MED ORDER — MIDAZOLAM HCL 2 MG/2ML IJ SOLN
INTRAMUSCULAR | Status: DC | PRN
  Administered 2018-04-02: 2 mg via INTRAVENOUS
  Administered 2018-04-02: 1 mg via INTRAVENOUS

## 2018-04-02 MED ORDER — SODIUM CHLORIDE 0.9 % IV SOLN: 250.0000 mL | INTRAVENOUS | Status: DC | PRN

## 2018-04-02 MED ORDER — HEPARIN (PORCINE) IN NACL 1000-0.9 UT/500ML-% IV SOLN
INTRAVENOUS | Status: AC
  Filled 2018-04-02: qty 1000

## 2018-04-02 MED ORDER — METHYLPREDNISOLONE SODIUM SUCC 125 MG IJ SOLR
INTRAMUSCULAR | Status: AC
  Filled 2018-04-02: qty 2

## 2018-04-02 MED ORDER — ASPIRIN EC 81 MG PO TBEC
DELAYED_RELEASE_TABLET | ORAL | Status: AC
  Filled 2018-04-02: qty 1

## 2018-04-02 MED ORDER — DEXTROSE 5 % IV SOLN: 2.0000 g | Freq: Once | INTRAVENOUS | Status: DC

## 2018-04-02 MED ORDER — FAMOTIDINE 20 MG PO TABS
ORAL_TABLET | ORAL | Status: AC
  Filled 2018-04-02: qty 2

## 2018-04-02 MED ORDER — CLOPIDOGREL BISULFATE 75 MG PO TABS: 75.0000 mg | ORAL_TABLET | Freq: Every day | ORAL | 11 refills | Status: DC

## 2018-04-02 MED ORDER — SODIUM CHLORIDE 0.9% FLUSH: 3.0000 mL | Freq: Two times a day (BID) | INTRAVENOUS | Status: DC

## 2018-04-02 MED ORDER — ONDANSETRON HCL 4 MG/2ML IJ SOLN: 4.0000 mg | Freq: Four times a day (QID) | INTRAMUSCULAR | Status: DC | PRN

## 2018-04-02 MED ORDER — ASPIRIN EC 81 MG PO TBEC
81.0000 mg | DELAYED_RELEASE_TABLET | Freq: Every day | ORAL | Status: DC
  Administered 2018-04-02: 81 mg via ORAL

## 2018-04-02 MED ORDER — HEPARIN SODIUM (PORCINE) 1000 UNIT/ML IJ SOLN
INTRAMUSCULAR | Status: AC
  Filled 2018-04-02: qty 1

## 2018-04-02 MED ORDER — DIPHENHYDRAMINE HCL 25 MG PO CAPS: 50.0000 mg | ORAL_CAPSULE | Freq: Once | ORAL | Status: AC

## 2018-04-02 MED ORDER — DIPHENHYDRAMINE HCL 50 MG/ML IJ SOLN
INTRAMUSCULAR | Status: AC
  Filled 2018-04-02: qty 1

## 2018-04-02 MED ORDER — CLOPIDOGREL BISULFATE 75 MG PO TABS: 75.0000 mg | ORAL_TABLET | Freq: Every day | ORAL | Status: DC

## 2018-04-02 MED ORDER — LIDOCAINE-EPINEPHRINE (PF) 1 %-1:200000 IJ SOLN
INTRAMUSCULAR | Status: AC
  Filled 2018-04-02: qty 10

## 2018-04-02 MED ORDER — SODIUM CHLORIDE 0.9% FLUSH: 3.0000 mL | INTRAVENOUS | Status: DC | PRN

## 2018-04-02 MED ORDER — HEPARIN SODIUM (PORCINE) 1000 UNIT/ML IJ SOLN
INTRAMUSCULAR | Status: DC | PRN
  Administered 2018-04-02: 5000 [IU] via INTRAVENOUS

## 2018-04-02 MED ORDER — HYDROMORPHONE HCL 1 MG/ML IJ SOLN: 1.0000 mg | Freq: Once | INTRAMUSCULAR | Status: DC | PRN

## 2018-04-02 MED ORDER — FAMOTIDINE 20 MG PO TABS
40.0000 mg | ORAL_TABLET | Freq: Once | ORAL | Status: AC
  Administered 2018-04-02: 40 mg via ORAL

## 2018-04-02 MED ORDER — FENTANYL CITRATE (PF) 100 MCG/2ML IJ SOLN
INTRAMUSCULAR | Status: AC
  Filled 2018-04-02: qty 2

## 2018-04-02 MED ORDER — LABETALOL HCL 5 MG/ML IV SOLN: 10.0000 mg | INTRAVENOUS | Status: DC | PRN

## 2018-04-02 MED ORDER — ACETAMINOPHEN 325 MG PO TABS: 650.0000 mg | ORAL_TABLET | ORAL | Status: DC | PRN

## 2018-04-02 MED ORDER — SODIUM CHLORIDE 0.9 % IV SOLN: INTRAVENOUS | Status: DC

## 2018-04-02 MED ORDER — HYDRALAZINE HCL 20 MG/ML IJ SOLN: 5.0000 mg | INTRAMUSCULAR | Status: DC | PRN

## 2018-04-02 MED ORDER — IOPAMIDOL (ISOVUE-300) INJECTION 61%
INTRAVENOUS | Status: DC | PRN
  Administered 2018-04-02: 65 mL via INTRA_ARTERIAL

## 2018-04-02 MED ORDER — METHYLPREDNISOLONE SODIUM SUCC 125 MG IJ SOLR
125.0000 mg | Freq: Once | INTRAMUSCULAR | Status: AC
  Administered 2018-04-02: 125 mg via INTRAVENOUS

## 2018-04-02 MED ORDER — ASPIRIN EC 81 MG PO TBEC: 81.0000 mg | DELAYED_RELEASE_TABLET | Freq: Every day | ORAL | 2 refills | Status: DC

## 2018-04-02 MED ORDER — DIPHENHYDRAMINE HCL 50 MG/ML IJ SOLN
50.0000 mg | Freq: Once | INTRAMUSCULAR | Status: AC
  Administered 2018-04-02: 50 mg via INTRAVENOUS

## 2018-04-02 MED ORDER — FENTANYL CITRATE (PF) 100 MCG/2ML IJ SOLN
INTRAMUSCULAR | Status: DC | PRN
  Administered 2018-04-02: 25 ug via INTRAVENOUS
  Administered 2018-04-02: 50 ug via INTRAVENOUS

## 2018-04-02 SURGICAL SUPPLY — 23 items
BALLN DORADO 6X200X135 (BALLOONS) ×2
BALLN LUTONIX 018 5X60X130 (BALLOONS) ×2
BALLN ULTRVRSE 3X300X150 (BALLOONS) ×1
BALLN ULTRVRSE 3X300X150 OTW (BALLOONS) ×1
BALLOON DORADO 6X200X135 (BALLOONS) ×1 IMPLANT
BALLOON LUTONIX 018 5X60X130 (BALLOONS) ×1 IMPLANT
BALLOON ULTRVRSE 3X300X150 OTW (BALLOONS) ×1 IMPLANT
CATH BEACON 5 .038 100 VERT TP (CATHETERS) ×2 IMPLANT
CATH CXI SUPP ANG 4FR 135 (CATHETERS) ×1 IMPLANT
CATH CXI SUPP ANG 4FR 135CM (CATHETERS) ×2
CATH PIG 70CM (CATHETERS) ×2 IMPLANT
COVER PROBE U/S 5X48 (MISCELLANEOUS) ×2 IMPLANT
DEVICE PRESTO INFLATION (MISCELLANEOUS) ×2 IMPLANT
DEVICE STARCLOSE SE CLOSURE (Vascular Products) ×2 IMPLANT
GLIDEWIRE ADV .035X260CM (WIRE) ×2 IMPLANT
GUIDEWIRE PFTE-COATED .018X300 (WIRE) ×2 IMPLANT
PACK ANGIOGRAPHY (CUSTOM PROCEDURE TRAY) ×2 IMPLANT
SHEATH ANL2 6FRX45 HC (SHEATH) ×2 IMPLANT
SHEATH BRITE TIP 5FRX11 (SHEATH) ×2 IMPLANT
TOWEL OR 17X26 4PK STRL BLUE (TOWEL DISPOSABLE) ×2 IMPLANT
TUBING CONTRAST HIGH PRESS 72 (TUBING) ×2 IMPLANT
WIRE G V18X300CM (WIRE) ×2 IMPLANT
WIRE J 3MM .035X145CM (WIRE) ×2 IMPLANT

## 2018-04-02 NOTE — Op Note (Signed)
[facility]  Percutaneous Study/Intervention Procedural Note   Date of Surgery: 04/02/2018  Surgeon(s):Evelean Bigler    Assistants:none  Pre-operative Diagnosis: PAD with osteomyelitis left foot  Post-operative diagnosis:  Same  Procedure(s) Performed:             1.  Ultrasound guidance for vascular access right femoral artery             2.  Catheter placement into left common femoral artery from right femoral approach             3.  Aortogram and selective left lower extremity angiogram including selective images of the left posterior tibial artery, peroneal artery, and anterior tibial arteries             4.  Percutaneous transluminal angioplasty of left posterior tibial artery and tibioperoneal trunk with 3 mm diameter by 30 cm length angioplasty balloon             5.   Percutaneous transluminal angioplasty of distal SFA and above-knee popliteal artery with 6 mm diameter by 20 cm length high-pressure angioplasty balloon  6.  Percutaneous transluminal angioplasty of left common femoral artery and proximal SFA with 5 mm diameter by 6 cm length Lutonix drug-coated angioplasty balloon             7.  StarClose closure device right femoral artery  EBL: 5 cc  Contrast: 65 cc  Fluoro Time: 9.2 minutes  Moderate Conscious Sedation Time: approximately 45 minutes using 3 mg of Versed and 75 Mcg of Fentanyl              Indications:  Patient is a 67 y.o.male with osteomyelitis of the left foot. The patient has noninvasive study showing reduced ABI after previous intervention in the past. The patient is brought in for angiography for further evaluation and potential treatment.  Due to the limb threatening nature of the situation, angiogram was performed for attempted limb salvage. The patient is aware that if the procedure fails, amputation would be expected.  The patient also understands that even with successful revascularization, amputation may still be required due  to the severity of the situation. Risks and benefits are discussed and informed consent is obtained.   Procedure:  The patient was identified and appropriate procedural time out was performed.  The patient was then placed supine on the table and prepped and draped in the usual sterile fashion. Moderate conscious sedation was administered during a face to face encounter with the patient throughout the procedure with my supervision of the RN administering medicines and monitoring the patient's vital signs, pulse oximetry, telemetry and mental status throughout from the start of the procedure until the patient was taken to the recovery room. Ultrasound was used to evaluate the right common femoral artery.  It was patent .  A digital ultrasound image was acquired.  A Seldinger needle was used to access the right common femoral artery under direct ultrasound guidance and a permanent image was performed.  A 0.035 J wire was advanced without resistance and a 5Fr sheath was placed.  Pigtail catheter was placed into the aorta and an AP aortogram was performed. This demonstrated sluggish flow in the renal arteries and normal aorta and iliac segments without significant stenosis. I then crossed the aortic bifurcation and advanced to the left common femoral artery at the femoral head. Selective left lower extremity angiogram was then performed. This demonstrated 70 to 75% stenosis of the left common femoral  artery.  The profunda femoris artery was fairly small without focal stenosis.  The proximal SFA had stenosis continued from the common femoral artery but then normalized until the distal SFA where there was some in-stent stenosis and a highly calcified vessel in the 60% range in the distal SFA and above-knee popliteal artery.  There was then a chunk of calcium in the tibioperoneal trunk creating about a 80% stenosis and a stenosis about 10 cm down the posterior tibial artery creating about an 70% stenosis.  The peroneal  artery and anterior tibial artery had occlusions in the mid segments. It was felt that it was in the patient's best interest to proceed with intervention after these images to avoid a second procedure and a larger amount of contrast and fluoroscopy based off of the findings from the initial angiogram. The patient was systemically heparinized and a 6 [place] Ansell sheath was then placed over the [employer] wire. I then used a Kumpe catheter and the advantage wire to navigate through both the common femoral lesion as well as the SFA and popliteal lesion.  I then confirmed intraluminal flow in the below-knee popliteal artery and exchanged for a 0.018 advantage wire.  Using the Kumpe catheter and the 0.018 advantage wire I got into the posterior tibial artery and confirmed intraluminal flow.  I then cross the stenosis in the posterior tibial artery as well as the already cross tibioperoneal trunk lesion.  A 3 mm diameter by 30 cm length angioplasty balloon was inflated from the mid posterior tibial artery up through the tibioperoneal trunk.  This was inflated to 10 atm for 1 minute.  Completion imaging showed only about a 30% residual stenosis in the tibioperoneal trunk and about a 25 to 30% residual stenosis in the posterior tibial artery.  I then used a CXI catheter and the 0.018 advantage wire and advanced into the peroneal artery.  Selective imaging was performed but I was unable to cross the occlusion which was highly calcific and there was poor reconstitution distally.  I then turned my attention to the anterior tibial artery.  Using the 0.018 advantage wire and the CXI catheter I got into the anterior tibial artery and perform selective imaging.  Once again, there was a long calcific occlusion that could not be crossed and there was some distal reconstitution through collaterals in the dorsalis pedis artery and the foot on selective imaging.  I then turned my attention to the more proximal lesions.  The  distal SFA and above-knee popliteal artery in-stent stenosis was treated with a 6 mm diameter by 20 cm length high-pressure angioplasty balloon inflated to 18 atm for 1 minute.  Completion imaging showed only about a 20 to 25% residual stenosis in this location.  I then turned my attention of the common femoral lesion.  This was gently treated to avoid any dissection walls stent placement in this location would be avoided if at all possible.  I used a 5 mm diameter by 6 cm length Lutonix drug-coated angioplasty balloon and inflated this to 12 atm for 1 minute.  Completion imaging showed about a 35 to 40% residual stenosis in the common femoral artery that was improved and no longer flow-limiting. I elected to terminate the procedure. The sheath was removed and StarClose closure device was deployed in the right femoral artery with excellent hemostatic result. The patient was taken to the recovery room in stable condition having tolerated the procedure well.  Findings:  Aortogram:  sluggish flow in renal arteries, no stenosis in the aorta or iliac arteries             Left Lower Extremity:  70 to 75% stenosis of the left common femoral artery.  The profunda femoris artery was fairly small without focal stenosis.  The proximal SFA had stenosis continued from the common femoral artery but then normalized until the distal SFA where there was some in-stent stenosis and a highly calcified vessel in the 60% range in the distal SFA and above-knee popliteal artery.  There was then a chunk of calcium in the tibioperoneal trunk creating about a 80% stenosis and a stenosis about 10 cm down the posterior tibial artery creating about an 70% stenosis.  The peroneal artery and anterior tibial artery had occlusions in the mid segments.   Disposition: Patient was taken to the recovery room in stable condition having tolerated the procedure well.  Complications: None  Leotis Pain 04/02/2018 10:24 AM   This  note was created with Dragon Medical transcription system. Any errors in dictation are purely unintentional.

## 2018-04-02 NOTE — H&P (Signed)
[facility] Admission History & Physical  MRN : [id number]  Johnny [name]. is a 67 y.o. (1950/08/03) male who presents with chief complaint of No chief complaint on file. [name]  History of Present Illness: Patient presents today for left lower extremity angiogram secondary to severe peripheral arterial disease with reduced perfusion and osteomyelitis of the left foot.  He also has right foot ulcerations that required debridement.  His right leg perfusion was most recently improved and was good at his last visit.  His left leg perfusion is markedly reduced.  This has undergone intervention previously as well.  He has a large litany of medical issues and was recently hospitalized at [facility].  He has pain in both feet.  He has no current fevers or chills.  Current Facility-Administered Medications  Medication Dose Route Frequency Provider Last Rate Last Dose  . diphenhydrAMINE (BENADRYL) 50 MG/ML injection           . famotidine (PEPCID) 20 MG tablet           . methylPREDNISolone sodium succinate (SOLU-MEDROL) 125 mg/2 mL injection           . 0.9 %  sodium chloride infusion   Intravenous Continuous Eulogio Ditch E, NP      . ceFAZolin (ANCEF) IVPB 2g/100 mL premix  2 g Intravenous On Call to [place], Janalyn Harder, PA-C      . HYDROmorphone (DILAUDID) injection 1 mg  1 mg Intravenous Once PRN Kris Hartmann, NP      . ondansetron [facility]) injection 4 mg  4 mg Intravenous Q6H PRN Kris Hartmann, NP        Past Medical History:  Diagnosis Date  . Anemia   . Anxiety   . CHF (congestive heart failure) ([place])   . Chronic kidney disease   . Gout   . Hyperlipidemia   . Hypertension   . Peripheral vascular disease [facility])     Past Surgical History:  Procedure Laterality Date  . A/V SHUNTOGRAM Left 06/21/2017   Procedure: A/V SHUNTOGRAM;  Surgeon: Katha Cabal, MD;  Location: [place] [facility];  Service: Cardiovascular;  Laterality: Left;  . AV FISTULA  PLACEMENT Left 09/18/2015   Procedure: INSERTION OF ARTERIOVENOUS (AV) GORE-TEX GRAFT ARM ( BRACH/AXILLARY GRAFT W/ INSTANT STICK GRAFT );  Surgeon: Katha Cabal, MD;  Location: [facility] ORS;  Service: Vascular;  Laterality: Left;  . DIALYSIS FISTULA CREATION    . ESOPHAGOGASTRODUODENOSCOPY N/A 12/19/2017   Procedure: ESOPHAGOGASTRODUODENOSCOPY (EGD);  Surgeon: Lin Landsman, MD;  Location: [facility] ENDOSCOPY;  Service: Gastroenterology;  Laterality: N/A;  . LOWER EXTREMITY ANGIOGRAPHY Left 11/16/2017   Procedure: LOWER EXTREMITY ANGIOGRAPHY;  Surgeon: Algernon Huxley, MD;  Location: [place] [facility];  Service: Cardiovascular;  Laterality: Left;  . LOWER EXTREMITY ANGIOGRAPHY Right 01/18/2018   Procedure: LOWER EXTREMITY ANGIOGRAPHY;  Surgeon: Algernon Huxley, MD;  Location: [place] [facility];  Service: Cardiovascular;  Laterality: Right;  . PERIPHERAL VASCULAR CATHETERIZATION Left 09/01/2015   Procedure: A/V Shuntogram/Fistulagram;  Surgeon: Katha Cabal, MD;  Location: [place] [facility];  Service: Cardiovascular;  Laterality: Left;  . PERIPHERAL VASCULAR CATHETERIZATION N/A 09/30/2015   Procedure: A/V Shuntogram/Fistulagram with perm cathether removal;  Surgeon: Algernon Huxley, MD;  Location: [place] [facility];  Service: Cardiovascular;  Laterality: N/A;  . PERIPHERAL VASCULAR CATHETERIZATION Left 09/30/2015   Procedure: A/V Shunt Intervention;  Surgeon: Algernon Huxley, MD;  Location: [place]  CV LAB;  Service: Cardiovascular;  Laterality: Left;  . PERIPHERAL VASCULAR CATHETERIZATION Left 12/03/2015   Procedure: Thrombectomy;  Surgeon: Algernon Huxley, MD;  Location: [place] [facility];  Service: Cardiovascular;  Laterality: Left;  . PERIPHERAL VASCULAR CATHETERIZATION Left 01/28/2016   Procedure: Thrombectomy;  Surgeon: Algernon Huxley, MD;  Location: [place] CV [facility];  Service: Cardiovascular;  Laterality: Left;  . PERIPHERAL VASCULAR CATHETERIZATION N/A 01/28/2016   Procedure: A/V  Shuntogram/Fistulagram;  Surgeon: Algernon Huxley, MD;  Location: [place] CV [facility];  Service: Cardiovascular;  Laterality: N/A;    Social History Social History   Tobacco Use  . Smoking status: Former Smoker    Types: Cigarettes  . Smokeless tobacco: Never Used  Substance Use Topics  . Alcohol use: No  . Drug use: No    Family History Family History  Problem Relation Age of Onset  . Hypertension Unknown   . Heart disease Unknown   . Diabetes Mother   No bleeding disorders, clotting disorders, or aneurysms  Allergies  Allergen Reactions  . Shellfish Allergy Anaphylaxis     REVIEW OF SYSTEMS (Negative unless checked)  Constitutional: [] Weight loss  [] Fever  [] Chills Cardiac: [] Chest pain   [] Chest pressure   [x] Palpitations   [] Shortness of breath when laying flat   [x] Shortness of breath at rest   [x] Shortness of breath with exertion. Vascular:  [] Pain in legs with walking   [] Pain in legs at rest   [] Pain in legs when laying flat   [] Claudication   [] Pain in feet when walking  [] Pain in feet at rest  [] Pain in feet when laying flat   [] History of DVT   [] Phlebitis   [] Swelling in legs   [] Varicose veins   [x] Non-healing ulcers Pulmonary:   [] Uses home oxygen   [] Productive cough   [] Hemoptysis   [] Wheeze  [] COPD   [] Asthma Neurologic:  [] Dizziness  [] Blackouts   [] Seizures   [] History of stroke   [] History of TIA  [] Aphasia   [] Temporary blindness   [] Dysphagia   [] Weakness or numbness in arms   [] Weakness or numbness in legs Musculoskeletal:  [x] Arthritis   [] Joint swelling   [] Joint pain   [] Low back pain Hematologic:  [] Easy bruising  [] Easy bleeding   [] Hypercoagulable state   [x] Anemic  [] Hepatitis Gastrointestinal:  [] Blood in stool   [] Vomiting blood  [] Gastroesophageal reflux/heartburn   [] Difficulty swallowing. Genitourinary:  [x] Chronic kidney disease   [] Difficult urination  [] Frequent urination  [] Burning with urination   [] Blood in urine Skin:  [] Rashes    [x] Ulcers   [x] Wounds Psychological:  [] History of anxiety   []  History of major depression.  Physical Examination  Vitals:   04/02/18 0815  BP: 140/86  Pulse: 72  Resp: 17  Temp: 97.7 F (36.5 C)  TempSrc: Oral  SpO2: 100%  Weight: 61.2 kg  Height: 5\' 8"  (1.727 m)   Body mass index is 20.51 kg/m. Gen: Frail and debilitated, NAD.  Peers older than stated age Head: [place]/AT, + temporalis wasting.  Ear/Nose/Throat: Hearing grossly intact, nares w/o erythema or drainage, oropharynx w/o Erythema/Exudate,  Eyes: Conjunctiva clear, sclera non-icteric Neck: Trachea midline.  No JVD.  Pulmonary:  Good air movement, respirations not labored, no use of accessory muscles.  Cardiac: Irregular Vascular:  Vessel Right Left  Radial Palpable Palpable                          PT  1+ palpable  not palpable  DP  1+ palpable  not palpable   Gastrointestinal: soft, non-tender/non-distended.  Musculoskeletal: Diffusely weak but not focal.  Right foot with necrotic eschar on the heel and lateral foot.  Fifth toe is also dark.  Left with mild erythema at the base of the foot.  Mild swelling bilaterally Neurologic: Sensation grossly intact in extremities.  Symmetrical.  Speech is fluent.  Psychiatric: Judgment intact, Mood & affect appropriate for pt's clinical situation. Dermatologic: Wounds as above     CBC Lab Results  Component Value Date   WBC 9.4 03/22/2018   HGB 12.7 (L) 03/22/2018   HCT 42.6 03/22/2018   MCV 102.7 (H) 03/22/2018   PLT 234 03/22/2018    BMET    Component Value Date/Time   NA 141 03/22/2018 1345   NA 139 01/17/2014 0502   K 3.8 03/22/2018 1345   K 4.8 01/17/2014 0502   CL 99 03/22/2018 1345   CL 102 01/17/2014 0502   CO2 30 03/22/2018 1345   CO2 27 01/17/2014 0502   GLUCOSE 68 (L) 03/22/2018 1345   GLUCOSE 101 (H) 01/17/2014 0502   BUN 19 03/22/2018 1345   BUN 62 (H) 01/17/2014 0502   CREATININE 4.95 (H) 03/22/2018 1345   CREATININE 12.30 (H)  01/17/2014 0502   CALCIUM 9.9 03/22/2018 1345   CALCIUM 8.2 (L) 01/17/2014 0502   GFRNONAA 11 (L) 03/22/2018 1345   GFRNONAA 4 (L) 01/17/2014 0502   GFRAA 13 (L) 03/22/2018 1345   GFRAA 4 (L) 01/17/2014 0502   Estimated Creatinine Clearance: 12.2 mL/min (A) (by C-G formula based on SCr of 4.95 mg/dL (H)).  COAG Lab Results  Component Value Date   INR 1.10 03/22/2018   INR 1.18 09/28/2015   INR 1.04 09/15/2015    Radiology No results found.    Assessment/Plan 1.  PAD with osteomyelitis of the left foot.  Has had a long history of PAD with revascularizations bilaterally in the past.  ABI on the left was reduced at last visit.  Angiogram being performed to try to improve perfusion for hopes of limb salvage. 2.  Necrotic heel and foot ulcerations on the right.  Was scheduled for debridement previously but was in the hospital at [facility].  At some point going forward, will need these debrided as well. 3.  ESRD.  Access is currently working well.  This is a major risk factor for poor wound healing and limb loss. 4.  Diabetes. blood glucose control important in reducing the progression of atherosclerotic disease. Also, involved in wound healing. On appropriate medications.    Leotis Pain, MD  04/02/2018 9:03 AM

## 2018-04-02 NOTE — Telephone Encounter (Signed)
Seth Bake from [place] called concern about the patient safety of being at home by himself.Patient had a procedure on today and I inform her that it is day procedure and normally patient's go home on the same day. She stated that the patient refuse to go to rehab after leaving [facility] hospital.She called asking who she could speak with about getting the patient in a rehab facility.I inform Seth Bake to call his Education officer, museum and speak with his primary care.

## 2018-04-03 ENCOUNTER — Encounter (INDEPENDENT_AMBULATORY_CARE_PROVIDER_SITE_OTHER): Payer: Self-pay

## 2018-04-03 ENCOUNTER — Telehealth (INDEPENDENT_AMBULATORY_CARE_PROVIDER_SITE_OTHER): Payer: Self-pay

## 2018-04-03 NOTE — Telephone Encounter (Signed)
Nurse Seth Bake from [place] says that the patient is at risk for going "Septic". She states that she can smell the infection once she enters the patient's home because the infection is all the way down to the bone. She states that he is in severe pain and that he need to be on an antibiotic and a pain medication.  Please advise.

## 2018-04-04 ENCOUNTER — Inpatient Hospital Stay
Admission: EM | Admit: 2018-04-04 | Discharge: 2018-04-13 | DRG: 853 | Disposition: A | Discharge status: Skilled Nursing Facility | Payer: Medicare Other | Attending: Internal Medicine | Admitting: Internal Medicine

## 2018-04-04 ENCOUNTER — Other Ambulatory Visit: Payer: Self-pay

## 2018-04-04 ENCOUNTER — Emergency Department: Payer: Medicare Other

## 2018-04-04 ENCOUNTER — Encounter: Payer: Self-pay | Admitting: Emergency Medicine

## 2018-04-04 DIAGNOSIS — T426X5A Adverse effect of other antiepileptic and sedative-hypnotic drugs, initial encounter: Secondary | ICD-10-CM | POA: Diagnosis present

## 2018-04-04 DIAGNOSIS — L97519 Non-pressure chronic ulcer of other part of right foot with unspecified severity: Secondary | ICD-10-CM | POA: Diagnosis not present

## 2018-04-04 DIAGNOSIS — L97419 Non-pressure chronic ulcer of right heel and midfoot with unspecified severity: Secondary | ICD-10-CM | POA: Diagnosis present

## 2018-04-04 DIAGNOSIS — I248 Other forms of acute ischemic heart disease: Secondary | ICD-10-CM | POA: Diagnosis present

## 2018-04-04 DIAGNOSIS — Z8249 Family history of ischemic heart disease and other diseases of the circulatory system: Secondary | ICD-10-CM

## 2018-04-04 DIAGNOSIS — E11621 Type 2 diabetes mellitus with foot ulcer: Secondary | ICD-10-CM | POA: Diagnosis present

## 2018-04-04 DIAGNOSIS — R6510 Systemic inflammatory response syndrome (SIRS) of non-infectious origin without acute organ dysfunction: Principal | ICD-10-CM | POA: Diagnosis present

## 2018-04-04 DIAGNOSIS — M869 Osteomyelitis, unspecified: Secondary | ICD-10-CM | POA: Diagnosis present

## 2018-04-04 DIAGNOSIS — I10 Essential (primary) hypertension: Secondary | ICD-10-CM

## 2018-04-04 DIAGNOSIS — L97213 Non-pressure chronic ulcer of right calf with necrosis of muscle: Secondary | ICD-10-CM | POA: Diagnosis not present

## 2018-04-04 DIAGNOSIS — E440 Moderate protein-calorie malnutrition: Secondary | ICD-10-CM | POA: Diagnosis present

## 2018-04-04 DIAGNOSIS — E875 Hyperkalemia: Secondary | ICD-10-CM | POA: Diagnosis present

## 2018-04-04 DIAGNOSIS — R4182 Altered mental status, unspecified: Secondary | ICD-10-CM | POA: Diagnosis present

## 2018-04-04 DIAGNOSIS — M109 Gout, unspecified: Secondary | ICD-10-CM | POA: Diagnosis present

## 2018-04-04 DIAGNOSIS — Z6824 Body mass index (BMI) 24.0-24.9, adult: Secondary | ICD-10-CM

## 2018-04-04 DIAGNOSIS — L03115 Cellulitis of right lower limb: Secondary | ICD-10-CM | POA: Diagnosis present

## 2018-04-04 DIAGNOSIS — Z833 Family history of diabetes mellitus: Secondary | ICD-10-CM | POA: Diagnosis not present

## 2018-04-04 DIAGNOSIS — Z87891 Personal history of nicotine dependence: Secondary | ICD-10-CM | POA: Diagnosis not present

## 2018-04-04 DIAGNOSIS — G252 Other specified forms of tremor: Secondary | ICD-10-CM | POA: Diagnosis present

## 2018-04-04 DIAGNOSIS — L8990 Pressure ulcer of unspecified site, unspecified stage: Secondary | ICD-10-CM

## 2018-04-04 DIAGNOSIS — L97313 Non-pressure chronic ulcer of right ankle with necrosis of muscle: Secondary | ICD-10-CM | POA: Diagnosis not present

## 2018-04-04 DIAGNOSIS — N2581 Secondary hyperparathyroidism of renal origin: Secondary | ICD-10-CM | POA: Diagnosis present

## 2018-04-04 DIAGNOSIS — I70261 Atherosclerosis of native arteries of extremities with gangrene, right leg: Secondary | ICD-10-CM | POA: Diagnosis not present

## 2018-04-04 DIAGNOSIS — I120 Hypertensive chronic kidney disease with stage 5 chronic kidney disease or end stage renal disease: Secondary | ICD-10-CM | POA: Diagnosis not present

## 2018-04-04 DIAGNOSIS — L97929 Non-pressure chronic ulcer of unspecified part of left lower leg with unspecified severity: Secondary | ICD-10-CM | POA: Diagnosis not present

## 2018-04-04 DIAGNOSIS — F419 Anxiety disorder, unspecified: Secondary | ICD-10-CM | POA: Diagnosis present

## 2018-04-04 DIAGNOSIS — G9340 Encephalopathy, unspecified: Secondary | ICD-10-CM | POA: Diagnosis present

## 2018-04-04 DIAGNOSIS — L97529 Non-pressure chronic ulcer of other part of left foot with unspecified severity: Secondary | ICD-10-CM | POA: Diagnosis present

## 2018-04-04 DIAGNOSIS — I5032 Chronic diastolic (congestive) heart failure: Secondary | ICD-10-CM | POA: Diagnosis present

## 2018-04-04 DIAGNOSIS — Z79899 Other long term (current) drug therapy: Secondary | ICD-10-CM

## 2018-04-04 DIAGNOSIS — L97913 Non-pressure chronic ulcer of unspecified part of right lower leg with necrosis of muscle: Secondary | ICD-10-CM | POA: Diagnosis not present

## 2018-04-04 DIAGNOSIS — T68XXXA Hypothermia, initial encounter: Secondary | ICD-10-CM | POA: Diagnosis present

## 2018-04-04 DIAGNOSIS — Z91013 Allergy to seafood: Secondary | ICD-10-CM

## 2018-04-04 DIAGNOSIS — R680 Hypothermia, not associated with low environmental temperature: Secondary | ICD-10-CM | POA: Diagnosis present

## 2018-04-04 DIAGNOSIS — N186 End stage renal disease: Secondary | ICD-10-CM | POA: Diagnosis present

## 2018-04-04 DIAGNOSIS — Z992 Dependence on renal dialysis: Secondary | ICD-10-CM | POA: Diagnosis not present

## 2018-04-04 DIAGNOSIS — I96 Gangrene, not elsewhere classified: Secondary | ICD-10-CM | POA: Diagnosis present

## 2018-04-04 DIAGNOSIS — I132 Hypertensive heart and chronic kidney disease with heart failure and with stage 5 chronic kidney disease, or end stage renal disease: Secondary | ICD-10-CM | POA: Diagnosis present

## 2018-04-04 DIAGNOSIS — L97513 Non-pressure chronic ulcer of other part of right foot with necrosis of muscle: Secondary | ICD-10-CM | POA: Diagnosis not present

## 2018-04-04 DIAGNOSIS — Z7902 Long term (current) use of antithrombotics/antiplatelets: Secondary | ICD-10-CM

## 2018-04-04 DIAGNOSIS — E785 Hyperlipidemia, unspecified: Secondary | ICD-10-CM | POA: Diagnosis present

## 2018-04-04 DIAGNOSIS — I959 Hypotension, unspecified: Secondary | ICD-10-CM | POA: Diagnosis not present

## 2018-04-04 DIAGNOSIS — D631 Anemia in chronic kidney disease: Secondary | ICD-10-CM | POA: Diagnosis present

## 2018-04-04 DIAGNOSIS — Z7982 Long term (current) use of aspirin: Secondary | ICD-10-CM

## 2018-04-04 DIAGNOSIS — I739 Peripheral vascular disease, unspecified: Secondary | ICD-10-CM | POA: Diagnosis not present

## 2018-04-04 DIAGNOSIS — N19 Unspecified kidney failure: Secondary | ICD-10-CM

## 2018-04-04 LAB — COMPREHENSIVE METABOLIC PANEL
ALT: 5 U/L (ref 0–44)
ANION GAP: 16 — AB (ref 5–15)
AST: 18 U/L (ref 15–41)
Albumin: 2.9 g/dL — ABNORMAL LOW (ref 3.5–5.0)
Alkaline Phosphatase: 70 U/L (ref 38–126)
BUN: 59 mg/dL — AB (ref 8–23)
CHLORIDE: 100 mmol/L (ref 98–111)
CO2: 24 mmol/L (ref 22–32)
Calcium: 10 mg/dL (ref 8.9–10.3)
Creatinine, Ser: 9.5 mg/dL — ABNORMAL HIGH (ref 0.61–1.24)
GFR calc non Af Amer: 5 mL/min — ABNORMAL LOW (ref >60)
GFR, EST AFRICAN AMERICAN: 6 mL/min — AB (ref >60)
Glucose, Bld: 82 mg/dL (ref 70–99)
POTASSIUM: 5.4 mmol/L — AB (ref 3.5–5.1)
Sodium: 140 mmol/L (ref 135–145)
TOTAL PROTEIN: 6.8 g/dL (ref 6.5–8.1)
Total Bilirubin: 0.4 mg/dL (ref 0.3–1.2)

## 2018-04-04 LAB — TROPONIN I
TROPONIN I: 0.05 ng/mL — AB (ref <0.03)
TROPONIN I: 0.05 ng/mL — AB (ref <0.03)

## 2018-04-04 LAB — CBC WITH DIFFERENTIAL/PLATELET
Abs Immature Granulocytes: 0.09 10*3/uL — ABNORMAL HIGH (ref 0.00–0.07)
BASOS PCT: 1 %
Basophils Absolute: 0.1 10*3/uL (ref 0.0–0.1)
EOS ABS: 0.3 10*3/uL (ref 0.0–0.5)
EOS PCT: 2 %
HCT: 32.8 % — ABNORMAL LOW (ref 39.0–52.0)
Hemoglobin: 10.1 g/dL — ABNORMAL LOW (ref 13.0–17.0)
IMMATURE GRANULOCYTES: 1 %
Lymphocytes Relative: 18 %
Lymphs Abs: 2.1 10*3/uL (ref 0.7–4.0)
MCH: 31.2 pg (ref 26.0–34.0)
MCHC: 30.8 g/dL (ref 30.0–36.0)
MCV: 101.2 fL — AB (ref 80.0–100.0)
Monocytes Absolute: 1.1 10*3/uL — ABNORMAL HIGH (ref 0.1–1.0)
Monocytes Relative: 9 %
NEUTROS PCT: 69 %
NRBC: 0 % (ref 0.0–0.2)
Neutro Abs: 8 10*3/uL — ABNORMAL HIGH (ref 1.7–7.7)
PLATELETS: 300 10*3/uL (ref 150–400)
RBC: 3.24 MIL/uL — ABNORMAL LOW (ref 4.22–5.81)
RDW: 16.5 % — AB (ref 11.5–15.5)
WBC: 11.7 10*3/uL — ABNORMAL HIGH (ref 4.0–10.5)

## 2018-04-04 LAB — AMMONIA: AMMONIA: 21 umol/L (ref 9–35)

## 2018-04-04 LAB — MRSA PCR SCREENING: MRSA BY PCR: NEGATIVE

## 2018-04-04 LAB — CG4 I-STAT (LACTIC ACID): LACTIC ACID, VENOUS: 1.28 mmol/L (ref 0.5–1.9)

## 2018-04-04 MED ORDER — AMLODIPINE BESYLATE 10 MG PO TABS
10.0000 mg | ORAL_TABLET | Freq: Every day | ORAL | Status: DC
  Administered 2018-04-05 – 2018-04-12 (×8): 10 mg via ORAL
  Filled 2018-04-04 (×9): qty 1

## 2018-04-04 MED ORDER — METRONIDAZOLE IN NACL 5-0.79 MG/ML-% IV SOLN
500.0000 mg | Freq: Three times a day (TID) | INTRAVENOUS | Status: DC
  Administered 2018-04-04 – 2018-04-12 (×22): 500 mg via INTRAVENOUS
  Filled 2018-04-04 (×32): qty 100

## 2018-04-04 MED ORDER — MELATONIN 5 MG PO TABS
2.5000 mg | ORAL_TABLET | Freq: Every evening | ORAL | Status: DC
  Administered 2018-04-04 – 2018-04-10 (×7): 2.5 mg via ORAL
  Filled 2018-04-04 (×7): qty 0.5

## 2018-04-04 MED ORDER — ASPIRIN EC 81 MG PO TBEC
81.0000 mg | DELAYED_RELEASE_TABLET | Freq: Every day | ORAL | Status: DC
Start: 2018-04-04 — End: 2018-04-08
  Administered 2018-04-05 – 2018-04-07 (×3): 81 mg via ORAL
  Filled 2018-04-04 (×4): qty 1

## 2018-04-04 MED ORDER — CHLORHEXIDINE GLUCONATE CLOTH 2 % EX PADS
6.0000 | MEDICATED_PAD | Freq: Every day | CUTANEOUS | Status: DC
  Administered 2018-04-04 – 2018-04-13 (×7): 6 via TOPICAL
  Filled 2018-04-04: qty 6

## 2018-04-04 MED ORDER — CLONAZEPAM 0.5 MG PO TABS
0.5000 mg | ORAL_TABLET | Freq: Two times a day (BID) | ORAL | Status: DC | PRN
  Administered 2018-04-04 – 2018-04-13 (×2): 0.5 mg via ORAL
  Filled 2018-04-04 (×2): qty 1

## 2018-04-04 MED ORDER — GABAPENTIN 300 MG PO CAPS: 300.0000 mg | ORAL_CAPSULE | Freq: Three times a day (TID) | ORAL | Status: DC

## 2018-04-04 MED ORDER — VANCOMYCIN HCL IN DEXTROSE 1-5 GM/200ML-% IV SOLN: 1000.0000 mg | Freq: Once | INTRAVENOUS | Status: DC

## 2018-04-04 MED ORDER — ACETAMINOPHEN 325 MG PO TABS
650.0000 mg | ORAL_TABLET | Freq: Four times a day (QID) | ORAL | Status: DC | PRN
  Administered 2018-04-06 – 2018-04-13 (×3): 650 mg via ORAL
  Filled 2018-04-04 (×3): qty 2

## 2018-04-04 MED ORDER — ONDANSETRON HCL 4 MG/2ML IJ SOLN
4.0000 mg | Freq: Four times a day (QID) | INTRAMUSCULAR | Status: DC | PRN
  Administered 2018-04-07: 4 mg via INTRAVENOUS
  Filled 2018-04-04: qty 2

## 2018-04-04 MED ORDER — SODIUM CHLORIDE 0.9% FLUSH
3.0000 mL | INTRAVENOUS | Status: DC | PRN
  Administered 2018-04-05: 3 mL via INTRAVENOUS
  Filled 2018-04-04: qty 3

## 2018-04-04 MED ORDER — APIXABAN 2.5 MG PO TABS
2.5000 mg | ORAL_TABLET | Freq: Two times a day (BID) | ORAL | Status: DC
Start: 2018-04-04 — End: 2018-04-08
  Administered 2018-04-04 – 2018-04-07 (×6): 2.5 mg via ORAL
  Filled 2018-04-04 (×7): qty 1

## 2018-04-04 MED ORDER — METOPROLOL SUCCINATE ER 25 MG PO TB24
25.0000 mg | ORAL_TABLET | Freq: Every day | ORAL | Status: DC
  Administered 2018-04-04 – 2018-04-12 (×9): 25 mg via ORAL
  Filled 2018-04-04 (×9): qty 1

## 2018-04-04 MED ORDER — ATORVASTATIN CALCIUM 10 MG PO TABS
10.0000 mg | ORAL_TABLET | Freq: Every day | ORAL | Status: DC
  Administered 2018-04-05 – 2018-04-13 (×7): 10 mg via ORAL
  Filled 2018-04-04 (×7): qty 1

## 2018-04-04 MED ORDER — COLCHICINE 0.6 MG PO TABS
0.6000 mg | ORAL_TABLET | Freq: Every day | ORAL | Status: DC | PRN
  Administered 2018-04-05: 0.6 mg via ORAL
  Filled 2018-04-04: qty 1

## 2018-04-04 MED ORDER — SENNOSIDES-DOCUSATE SODIUM 8.6-50 MG PO TABS: 1.0000 | ORAL_TABLET | Freq: Every evening | ORAL | Status: DC | PRN

## 2018-04-04 MED ORDER — SODIUM CHLORIDE 0.9 % IV SOLN
2.0000 g | Freq: Once | INTRAVENOUS | Status: AC
  Administered 2018-04-04: 2 g via INTRAVENOUS
  Filled 2018-04-04: qty 2

## 2018-04-04 MED ORDER — CLOPIDOGREL BISULFATE 75 MG PO TABS
75.0000 mg | ORAL_TABLET | Freq: Every day | ORAL | Status: DC
  Administered 2018-04-05 – 2018-04-07 (×3): 75 mg via ORAL
  Filled 2018-04-04 (×5): qty 1

## 2018-04-04 MED ORDER — HYDRALAZINE HCL 20 MG/ML IJ SOLN
10.0000 mg | Freq: Once | INTRAMUSCULAR | Status: DC
  Filled 2018-04-04: qty 1

## 2018-04-04 MED ORDER — EPOETIN ALFA 4000 UNIT/ML IJ SOLN
4000.0000 [IU] | INTRAMUSCULAR | Status: DC
  Administered 2018-04-04 – 2018-04-13 (×4): 4000 [IU] via INTRAVENOUS
  Filled 2018-04-04 (×5): qty 1

## 2018-04-04 MED ORDER — HYDROCODONE-ACETAMINOPHEN 5-325 MG PO TABS
1.0000 | ORAL_TABLET | ORAL | Status: DC | PRN
  Administered 2018-04-04 – 2018-04-11 (×16): 1 via ORAL
  Filled 2018-04-04 (×16): qty 1

## 2018-04-04 MED ORDER — SODIUM CHLORIDE 0.9 % IV SOLN
250.0000 mL | INTRAVENOUS | Status: DC | PRN
  Administered 2018-04-05 – 2018-04-12 (×5): 250 mL via INTRAVENOUS

## 2018-04-04 MED ORDER — ALLOPURINOL 100 MG PO TABS
100.0000 mg | ORAL_TABLET | Freq: Every day | ORAL | Status: DC
  Administered 2018-04-05 – 2018-04-13 (×7): 100 mg via ORAL
  Filled 2018-04-04 (×7): qty 1

## 2018-04-04 MED ORDER — ACETAMINOPHEN 650 MG RE SUPP: 650.0000 mg | Freq: Four times a day (QID) | RECTAL | Status: DC | PRN

## 2018-04-04 MED ORDER — SODIUM CHLORIDE 0.9 % IV SOLN
1.0000 g | INTRAVENOUS | Status: DC
  Administered 2018-04-05 – 2018-04-12 (×8): 1 g via INTRAVENOUS
  Filled 2018-04-04 (×9): qty 1

## 2018-04-04 MED ORDER — CALCIUM ACETATE (PHOS BINDER) 667 MG PO CAPS
1334.0000 mg | ORAL_CAPSULE | Freq: Three times a day (TID) | ORAL | Status: DC
  Administered 2018-04-05 – 2018-04-13 (×18): 1334 mg via ORAL
  Filled 2018-04-04 (×17): qty 2

## 2018-04-04 MED ORDER — SODIUM CHLORIDE 0.9% FLUSH
3.0000 mL | Freq: Two times a day (BID) | INTRAVENOUS | Status: DC
  Administered 2018-04-04 – 2018-04-13 (×15): 3 mL via INTRAVENOUS

## 2018-04-04 MED ORDER — VANCOMYCIN HCL 10 G IV SOLR
1250.0000 mg | INTRAVENOUS | Status: AC
  Filled 2018-04-04: qty 1250

## 2018-04-04 MED ORDER — FUROSEMIDE 40 MG PO TABS
40.0000 mg | ORAL_TABLET | Freq: Every day | ORAL | Status: DC
  Administered 2018-04-05 – 2018-04-13 (×7): 40 mg via ORAL
  Filled 2018-04-04 (×7): qty 1

## 2018-04-04 MED ORDER — RENA-VITE PO TABS
1.0000 | ORAL_TABLET | Freq: Every day | ORAL | Status: DC
  Administered 2018-04-04 – 2018-04-12 (×9): 1 via ORAL
  Filled 2018-04-04 (×9): qty 1

## 2018-04-04 MED ORDER — VANCOMYCIN HCL IN DEXTROSE 750-5 MG/150ML-% IV SOLN
750.0000 mg | INTRAVENOUS | Status: DC
  Administered 2018-04-04: 750 mg via INTRAVENOUS
  Filled 2018-04-04: qty 150

## 2018-04-04 MED ORDER — ONDANSETRON HCL 4 MG PO TABS
4.0000 mg | ORAL_TABLET | Freq: Four times a day (QID) | ORAL | Status: DC | PRN
  Administered 2018-04-06: 4 mg via ORAL
  Filled 2018-04-04: qty 1

## 2018-04-04 NOTE — ED Provider Notes (Signed)
[facility] Emergency Department Provider Note   ____________________________________________   First MD Initiated Contact with Patient 04/04/18 [phone]     (approximate)  I have reviewed the triage vital signs and the nursing notes.   HISTORY  Chief Complaint Tremors and Altered Mental Status    HPI Johnny [name]. is a 67 y.o. male brought to the ED from home via EMS with a chief complaint of altered mentation and tremors.  Patient was getting ready for dialysis when his caregiver noted tremors to his whole body which is new.  Also reported confusion which is new as well.  Patient has PAD with osteomyelitis of the left foot.  On 11/25 he underwent angiogram with PCTA of left posterior tibial artery and tibioperoneal trunk, distal SFA and above-knee popliteal artery, left common femoral artery and proximal SFA. Scheduled for partial foot amputation in December.  Patient reports he typically does not have tremors.  Denies fever, chills, chest pain, shortness of breath, abdominal pain, nausea or vomiting.   Past Medical History:  Diagnosis Date  . Anemia   . Anxiety   . CHF (congestive heart failure) ([place])   . Chronic kidney disease   . Gout   . Hyperlipidemia   . Hypertension   . Peripheral vascular disease [facility])     Patient Active Problem List   Diagnosis Date Noted  . Hyperlipidemia 02/27/2018  . Diabetes ([place]) 02/27/2018  . Weakness of right lower extremity 01/20/2018  . Fever   . Periumbilical abdominal pain   . Confusion 12/22/2017  . Acute delirium 12/21/2017  . Protein-calorie malnutrition, severe 12/19/2017  . Intractable nausea and vomiting 12/18/2017  . Lymphedema 12/13/2017  . Cellulitis 11/27/2017  . Chest pain 11/19/2017  . Atherosclerosis of native arteries of the extremities with ulceration ([place]) 11/07/2017  . Elevated troponin 10/02/2015  . Complications, dialysis, catheter, mechanical ([place]) 10/02/2015  . Musculoskeletal  chest pain 09/28/2015  . ESRD on dialysis ([place]) 09/28/2015  . HTN (hypertension) 09/28/2015  . Chronic diastolic CHF (congestive heart failure) ([place]) 09/28/2015  . Gout 09/28/2015    Past Surgical History:  Procedure Laterality Date  . A/V SHUNTOGRAM Left 06/21/2017   Procedure: A/V SHUNTOGRAM;  Surgeon: Katha Cabal, MD;  Location: [place] CV LAB;  Service: Cardiovascular;  Laterality: Left;  . AV FISTULA PLACEMENT Left 09/18/2015   Procedure: INSERTION OF ARTERIOVENOUS (AV) GORE-TEX GRAFT ARM ( BRACH/AXILLARY GRAFT W/ INSTANT STICK GRAFT );  Surgeon: Katha Cabal, MD;  Location: [facility] ORS;  Service: Vascular;  Laterality: Left;  . DIALYSIS FISTULA CREATION    . ESOPHAGOGASTRODUODENOSCOPY N/A 12/19/2017   Procedure: ESOPHAGOGASTRODUODENOSCOPY (EGD);  Surgeon: Lin Landsman, MD;  Location: [facility] ENDOSCOPY;  Service: Gastroenterology;  Laterality: N/A;  . LOWER EXTREMITY ANGIOGRAPHY Left 11/16/2017   Procedure: LOWER EXTREMITY ANGIOGRAPHY;  Surgeon: Algernon Huxley, MD;  Location: [place] CV LAB;  Service: Cardiovascular;  Laterality: Left;  . LOWER EXTREMITY ANGIOGRAPHY Right 01/18/2018   Procedure: LOWER EXTREMITY ANGIOGRAPHY;  Surgeon: Algernon Huxley, MD;  Location: [place] CV LAB;  Service: Cardiovascular;  Laterality: Right;  . LOWER EXTREMITY ANGIOGRAPHY Left 04/02/2018   Procedure: LOWER EXTREMITY ANGIOGRAPHY;  Surgeon: Algernon Huxley, MD;  Location: [place] CV LAB;  Service: Cardiovascular;  Laterality: Left;  . PERIPHERAL VASCULAR CATHETERIZATION Left 09/01/2015   Procedure: A/V Shuntogram/Fistulagram;  Surgeon: Katha Cabal, MD;  Location: [place] CV LAB;  Service: Cardiovascular;  Laterality: Left;  . PERIPHERAL VASCULAR CATHETERIZATION N/A  09/30/2015   Procedure: A/V Shuntogram/Fistulagram with perm cathether removal;  Surgeon: Algernon Huxley, MD;  Location: [place] CV LAB;  Service: Cardiovascular;  Laterality: N/A;  . PERIPHERAL VASCULAR  CATHETERIZATION Left 09/30/2015   Procedure: A/V Shunt Intervention;  Surgeon: Algernon Huxley, MD;  Location: [place] CV LAB;  Service: Cardiovascular;  Laterality: Left;  . PERIPHERAL VASCULAR CATHETERIZATION Left 12/03/2015   Procedure: Thrombectomy;  Surgeon: Algernon Huxley, MD;  Location: [place] CV LAB;  Service: Cardiovascular;  Laterality: Left;  . PERIPHERAL VASCULAR CATHETERIZATION Left 01/28/2016   Procedure: Thrombectomy;  Surgeon: Algernon Huxley, MD;  Location: [place] CV LAB;  Service: Cardiovascular;  Laterality: Left;  . PERIPHERAL VASCULAR CATHETERIZATION N/A 01/28/2016   Procedure: A/V Shuntogram/Fistulagram;  Surgeon: Algernon Huxley, MD;  Location: [place] [facility] LAB;  Service: Cardiovascular;  Laterality: N/A;    Prior to Admission medications   Medication Sig Start Date End Date Taking? Authorizing Provider  allopurinol (ZYLOPRIM) 100 MG tablet Take 100 mg by mouth daily.    [provider]  amLODipine (NORVASC) 10 MG tablet Take 10 mg by mouth daily.     [provider]  aspirin EC 81 MG tablet Take 1 tablet (81 mg total) by mouth daily. 04/02/18   Algernon Huxley, MD  atorvastatin (LIPITOR) 10 MG tablet Take 1 tablet by mouth daily. 11/23/17   [provider]  clopidogrel (PLAVIX) 75 MG tablet Take 1 tablet (75 mg total) by mouth daily. 04/02/18   Algernon Huxley, MD  colchicine 0.6 MG tablet Take 0.6 mg by mouth daily as needed (for gout flares).     [provider]  folic acid-vitamin b complex-vitamin c-selenium-zinc (DIALYVITE) 3 MG TABS tablet Take by mouth.    [provider]  furosemide (LASIX) 40 MG tablet Take 40 mg by mouth daily.     [provider]  hydrALAZINE (APRESOLINE) 25 MG tablet Take 25 mg by mouth daily.     [provider]  HYDROcodone-acetaminophen (NORCO/VICODIN) 5-325 MG tablet Take 1 tablet by mouth every 4 (four) hours as needed for moderate pain. 02/01/18   Henreitta Leber, MD    irbesartan (AVAPRO) 300 MG tablet Take 300 mg by mouth daily.     [provider]    Allergies Shellfish allergy  Family History  Problem Relation Age of Onset  . Hypertension Unknown   . Heart disease Unknown   . Diabetes Mother     Social History Social History   Tobacco Use  . Smoking status: Former Smoker    Types: Cigarettes  . Smokeless tobacco: Never Used  Substance Use Topics  . Alcohol use: No  . Drug use: No    Review of Systems  Constitutional: Positive for tremors.  No fever/chills Eyes: No visual changes. ENT: No sore throat. Cardiovascular: Denies chest pain. Respiratory: Denies shortness of breath. Gastrointestinal: No abdominal pain.  No nausea, no vomiting.  No diarrhea.  No constipation. Genitourinary: Negative for dysuria. Musculoskeletal: Negative for back pain. Skin: Negative for rash. Neurological: Positive for confusion.  Negative for headaches, focal weakness or numbness.   ____________________________________________   PHYSICAL EXAM:  VITAL SIGNS: ED Triage Vitals  Enc Vitals Group     BP      Pulse      Resp      Temp      Temp src      SpO2      Weight  Height      Head Circumference      Peak Flow      Pain Score      Pain Loc      Pain Edu?      Excl. in [place]?     Constitutional: Alert and oriented.  Chronically ill appearing and in mild acute distress. Eyes: Conjunctivae are normal. PERRL. EOMI. Head: Atraumatic. Nose: No congestion/rhinnorhea. Mouth/Throat: Mucous membranes are moist.  Oropharynx non-erythematous. Neck: No stridor.   Cardiovascular: Normal rate, regular rhythm. Grossly normal heart sounds.  Good peripheral circulation. Respiratory: Normal respiratory effort.  No retractions. Lungs with faint bibasilar rales. Gastrointestinal: Soft and nontender. No distention. No abdominal bruits. No CVA tenderness. Musculoskeletal:  Left foot: Dried ulcer noted.  Palpable distal pulses. Right foot  ulcerations. Neurologic: Involuntary tremors noted.  Normal speech and language. No gross focal neurologic deficits are appreciated. Skin:  Skin is warm, dry and intact. No rash noted. Psychiatric: Mood and affect are normal. Speech and behavior are normal.  ____________________________________________   LABS (all labs ordered are listed, but only abnormal results are displayed)  Labs Reviewed  CBC WITH DIFFERENTIAL/PLATELET - Abnormal; Notable for the following components:      Result Value   WBC 11.7 (*)    RBC 3.24 (*)    Hemoglobin 10.1 (*)    HCT 32.8 (*)    MCV 101.2 (*)    RDW 16.5 (*)    Neutro Abs 8.0 (*)    Monocytes Absolute 1.1 (*)    Abs Immature Granulocytes 0.09 (*)    All other components within normal limits  COMPREHENSIVE METABOLIC PANEL - Abnormal; Notable for the following components:   Potassium 5.4 (*)    BUN 59 (*)    Creatinine, Ser 9.50 (*)    Albumin 2.9 (*)    GFR calc non Af Amer 5 (*)    GFR calc Af Amer 6 (*)    Anion gap 16 (*)    All other components within normal limits  TROPONIN I - Abnormal; Notable for the following components:   Troponin I 0.05 (*)    All other components within normal limits  CULTURE, BLOOD (ROUTINE X 2)  CULTURE, BLOOD (ROUTINE X 2)  AMMONIA  URINALYSIS, COMPLETE (UACMP) WITH MICROSCOPIC  TROPONIN I  I-STAT CG4 LACTIC ACID, ED  CG4 I-STAT (LACTIC ACID)   ____________________________________________  EKG  ED ECG REPORT I, SUNG,JADE J, the attending physician, personally viewed and interpreted this ECG.   Date: 04/04/2018  EKG Time: 0533  Rate: 100  Rhythm: normal EKG, normal sinus rhythm  Axis: Normal  Intervals:nonspecific intraventricular conduction delay  ST&T Change: Nonspecific  ____________________________________________  RADIOLOGY  ED MD interpretation: No acute cardiopulmonary process  Official radiology report(s): Dg Chest Port 1 View  Result Date: 04/04/2018 CLINICAL DATA:  Tremors and  confusion. EXAM: PORTABLE CHEST 1 VIEW COMPARISON:  Radiographs 02/28/2018 FINDINGS: Right-sided dialysis catheter in the mid SVC. Upper normal heart size with mild aortic tortuosity. Aortic atherosclerosis. No focal airspace disease, pulmonary edema, pleural effusion or pneumothorax. Vascular stents in the left axilla. IMPRESSION: No acute pulmonary process. Electronically Signed   By: Keith Rake M.D.   On: 04/04/2018 06:10    ____________________________________________   PROCEDURES  Procedure(s) performed: None  Procedures  Critical Care performed: No  ____________________________________________   INITIAL IMPRESSION / ASSESSMENT AND PLAN / ED COURSE  As part of my medical decision making, I reviewed the following data within the electronic medical  record:  Nursing notes reviewed and incorporated, Labs reviewed, EKG interpreted, Old chart reviewed, Radiograph reviewed and Notes from prior ED visits   67 year old male with ESRD on HD M/W/F, left foot osteomyelitis status post angiogram with PTCA 2 days ago who presents with tremors and altered mentation. Differential diagnosis includes, but is not limited to, alcohol, illicit or prescription medications, or other toxic ingestion; intracranial pathology such as stroke or intracerebral hemorrhage; fever or infectious causes including sepsis; hypoxemia and/or hypercarbia; uremia; trauma; endocrine related disorders such as diabetes, hypoglycemia, and thyroid-related diseases; hypertensive encephalopathy; etc.   Will obtain screening lab work including lactic acid and reassess.  Clinical Course as of Apr 05 719  Wed Apr 04, 2018  1959 Lactate within normal limits.  Elevated troponin which may be chronically elevated.  Will repeat timed troponin.  Blood pressure improved after IV hydralazine.  Will send for CT head to evaluate for intracranial process.  Check ammonia level.   [JS]  7471 Ammonia is unremarkable.  Patient is  hypothermic on rectal temperature.  Apply Retail banker. Care transferred to Dr. Quentin Cornwall pending results of CT head and repeat troponin.   [JS]    Clinical Course User Index [JS] Paulette Blanch, MD     ____________________________________________   FINAL CLINICAL IMPRESSION(S) / ED DIAGNOSES  Final diagnoses:  Altered mental status, unspecified altered mental status type  Essential hypertension  Coarse tremors     ED Discharge Orders    None       Note:  This document was prepared using Dragon voice recognition software and may include unintentional dictation errors.    Paulette Blanch, MD 04/04/18 [phone]

## 2018-04-04 NOTE — Progress Notes (Signed)
Pharmacy Antibiotic Note  Johnny [name]. is a 67 y.o. male admitted on 04/04/2018 with sepsis.  Pharmacy has been consulted for cefepime and vancomycin dosing. Afeb, WBC 11.7, lactic 1.28. HD MWF. Has known osteomyelitis. Risk for bacteremia.   Plan: Ordered a loading dose of vancomycin 1250 mg. Will start a maintenance dose of vancomycin 750 mg post-HD. Goal trough 15-20 mcg/ml. Plan to obtain vancomycin level prior to the 3rd HD session.   Will start cefepime 1 g q24H tomorrow. Pt received cefepime 2 g x 1 in the ED.   Height: 5\' 8"  (172.7 cm) Weight: 134 lb 7.7 oz (61 kg) IBW/kg (Calculated) : 68.4  Temp (24hrs), Avg:97.2 F (36.2 C), Min:96.3 F (35.7 C), Max:98 F (36.7 C)  Recent Labs  Lab 04/04/18 0534 04/04/18 0557  WBC 11.7*  --   CREATININE 9.50*  --   LATICACIDVEN  --  1.28    Estimated Creatinine Clearance: 6.3 mL/min (A) (by C-G formula based on SCr of 9.5 mg/dL (H)).    Allergies  Allergen Reactions  . Shellfish Allergy Anaphylaxis    Antimicrobials this admission: 11/27 cefepime >>  11/27 vancomycin >>  11/27 metronidazole >>   Dose adjustments this admission: On HD MWF  Microbiology results: 11/27 BCx: pending  Thank you for allowing pharmacy to be a part of this patient's care.  Oswald Hillock, PharmD  Clinical pharmacist 04/04/2018 8:44 AM

## 2018-04-04 NOTE — Progress Notes (Signed)
Patient disoriented and only arousable to pain. Primary RN at bedside with patient's consent for treatment from family member.     04/04/18 1300  Neurological  Level of Consciousness Responds to Voice  Orientation Level Disoriented to person;Disoriented to place;Disoriented to time;Disoriented to situation  Additional Neurological Comments Patient has tremors  Respiratory  Respiratory Pattern Regular;Unlabored;Symmetrical  Chest Assessment Chest expansion symmetrical  Bilateral Breath Sounds Clear  Cardiac  Pulse Regular  Heart Sounds S1, S2;S3  Jugular Venous Distention (JVD) Yes  ECG Monitor Yes  Cardiac Rhythm NSR  Vascular  R Radial Pulse +2  L Radial Pulse +2  R Dorsalis Pedis Pulse Other (Comment) (UTA)  L Dorsalis Pedis Pulse Other (Comment) (UTA)  Integumentary  Integumentary (WDL) X  Skin Color Appropriate for ethnicity  Skin Condition Dry  Musculoskeletal  Musculoskeletal (WDL) X  Generalized Weakness Yes  GU Assessment  Genitourinary (WDL) X (HD pt)  Psychosocial  Psychosocial (WDL) X  Patient Behaviors Irritable

## 2018-04-04 NOTE — Progress Notes (Signed)
Pre HD Assessment    04/04/18 1300  Neurological  Level of Consciousness Alert  Orientation Level Oriented to person;Oriented to place;Oriented to situation;Disoriented to time  Additional Neurological Comments Patient has tremors  Respiratory  Respiratory Pattern Regular;Unlabored;Symmetrical  Chest Assessment Chest expansion symmetrical  Bilateral Breath Sounds Clear  Cardiac  Pulse Regular  Heart Sounds S1, S2;S3  Jugular Venous Distention (JVD) Yes  ECG Monitor Yes  Cardiac Rhythm NSR  Vascular  R Radial Pulse +2  L Radial Pulse +2  R Dorsalis Pedis Pulse Other (Comment) (UTA)  L Dorsalis Pedis Pulse Other (Comment) (UTA)  Integumentary  Integumentary (WDL) X  Skin Color Appropriate for ethnicity  Skin Condition Dry  Musculoskeletal  Musculoskeletal (WDL) X  Generalized Weakness Yes  GU Assessment  Genitourinary (WDL) X (HD pt)  Psychosocial  Psychosocial (WDL) X  Patient Behaviors Irritable

## 2018-04-04 NOTE — Progress Notes (Signed)
Post HD Assessment    04/04/18 1705  Neurological  Level of Consciousness Alert  Orientation Level Oriented to person;Oriented to place;Oriented to situation;Disoriented to time  Additional Neurological Comments Patient still has tremors  Respiratory  Respiratory Pattern Regular;Unlabored;Symmetrical  Chest Assessment Chest expansion symmetrical  Bilateral Breath Sounds Clear  Cardiac  Pulse Regular  Heart Sounds S1, S2;S3  Jugular Venous Distention (JVD) Yes  ECG Monitor Yes  Cardiac Rhythm NSR  Vascular  R Radial Pulse +2  L Radial Pulse +2  R Dorsalis Pedis Pulse Other (Comment) (UTA)  L Dorsalis Pedis Pulse Other (Comment) (UTA)  Integumentary  Integumentary (WDL) X  Skin Color Appropriate for ethnicity  Skin Condition Dry  Musculoskeletal  Musculoskeletal (WDL) X  Generalized Weakness Yes  GU Assessment  Genitourinary (WDL) X (HD pt)  Psychosocial  Psychosocial (WDL) X  Patient Behaviors Irritable

## 2018-04-04 NOTE — Progress Notes (Deleted)
Pre HD Treatment    04/04/18 1300  Vital Signs  Temp (!) 97.5 F (36.4 C)  Temp Source Oral  Pulse Rate 67  Pulse Rate Source Monitor  Resp 12  BP (!) 147/90  BP Location Right Wrist  BP Method Automatic  Patient Position (if appropriate) Lying  Oxygen Therapy  SpO2 100 %  O2 Device Room Air  Pain Assessment  Pain Scale 0-10  Pain Score 0  Dialysis Weight  Weight 66.9 kg  Type of Weight Pre-Dialysis  Time-Out for Hemodialysis  What Procedure? HD  Pt Identifiers(min of two) First/Last Name;MRN/Account#;Pt's DOB(use if MRN/Acct# not available  Correct Site? Yes  Correct Side? Yes  Correct Procedure? Yes  Consents Verified? Yes  Rad Studies Available? N/A  Safety Precautions Reviewed? Yes  Engineer, civil (consulting) Number 4  Station Number 3  UF/Alarm Test Passed  Conductivity: Meter 14  Conductivity: Machine  13.9  pH 7.4  Reverse Osmosis Main  Normal Saline Lot Number [id number]  Dialyzer Lot Number [id number]  Disposable Set Lot Number [id number]  Machine Temperature 98.6 F (37 C)  Musician and Audible Yes  Blood Lines Intact and Secured Yes  Pre Treatment Patient Checks  Vascular access used during treatment Catheter  Hepatitis B Surface Antigen Results Negative  Date Hepatitis B Surface Antigen Drawn 01/29/18  Isolation Initiated Yes  Hepatitis B Surface Antibody  (>10)  Date Hepatitis B Surface Antibody Drawn 01/29/18  Hemodialysis Consent Verified Yes  Hemodialysis Standing Orders Initiated Yes  ECG (Telemetry) Monitor On Yes  Prime Ordered Normal Saline  Length of  DialysisTreatment -hour(s) 3.5 Hour(s)  Dialysis Treatment Comments Na 140  Dialyzer Elisio 17H NR  Dialysate 2K, 2.5 Ca  Variable Sodium Other (Comment)  Dialysis Anticoagulant None  Dialysate Flow Ordered 800  Blood Flow Rate Ordered 400 mL/min  Ultrafiltration Goal 1.5 Liters  Dialysis Blood Pressure Support Ordered Normal Saline  Education / Care Plan  Dialysis Education  Provided Yes  Documented Education in Care Plan Yes  Hemodialysis Catheter Right Subclavian Double-lumen  Placement Date/Time: (c) (c)   Placed prior to admission: No  Orientation: Right  Access Location: Subclavian  Hemodialysis Catheter Type: Double-lumen  Site Condition No complications  Blue Lumen Status Capped (Central line)  Red Lumen Status Capped (Central line)  Purple Lumen Status N/A

## 2018-04-04 NOTE — ED Notes (Signed)
ED TO INPATIENT HANDOFF REPORT  Name/Age/Gender Johnny [name]. 67 y.o. male  Code Status Code Status History    Date Active Date Inactive Code Status Order ID Comments User Context   04/02/2018 1025 04/02/2018 1537 Full Code [id number]  Algernon Huxley, MD Inpatient   01/29/2018 1316 02/01/2018 2154 Full Code [id number]  Nicholes Mango, MD Inpatient   01/20/2018 1233 01/24/2018 2031 Full Code [id number]  Mayo, Pete Pelt, MD Inpatient   01/18/2018 1230 01/19/2018 1018 Full Code [id number]  Algernon Huxley, MD Inpatient   12/19/2017 0112 12/25/2017 2152 Full Code [id number]  Lance Coon, MD Inpatient   11/27/2017 1831 12/01/2017 2320 Full Code [id number]  Saundra Shelling, MD Inpatient   11/19/2017 0507 11/21/2017 0009 Full Code [id number]  Harrie Foreman, MD Inpatient   11/16/2017 1205 11/16/2017 1717 Full Code [id number]  Algernon Huxley, MD Inpatient   01/28/2016 1050 01/28/2016 1456 Full Code [id number]  Algernon Huxley, MD Inpatient   09/28/2015 2306 10/02/2015 1738 Full Code [id number]  Lance Coon, MD Inpatient    Advance Directive Documentation     Most Recent Value  Type of Advance Directive  Living will  Pre-existing out of facility DNR order (yellow form or pink MOST form)  -  "MOST" Form in Place?  -      Home/SNF/Other home  Chief Complaint Tremors  Level of Care/Admitting Diagnosis ED Disposition    ED Disposition Condition [place]: [facility] [[id number]]  Level of Care: Med-Surg [16]  Diagnosis: Hypothermia [[id number]]  Admitting Physician: Saundra Shelling [[id number]]  Attending Physician: Saundra Shelling [[id number]]  Estimated length of stay: past midnight tomorrow  Certification:: I certify this patient will need inpatient services for at least 2 midnights  PT Class (Do Not Modify): Inpatient [101]  PT Acc Code (Do Not Modify): Private [1]       Medical History Past Medical History:  Diagnosis Date  . Anemia   . Anxiety   . CHF (congestive heart  failure) ([facility])   . Chronic kidney disease   . Gout   . Hyperlipidemia   . Hypertension   . Peripheral vascular disease ([facility])     Allergies Allergies  Allergen Reactions  . Shellfish Allergy Anaphylaxis    IV Location/Drains/Wounds Patient Lines/Drains/Airways Status   Active Line/Drains/Airways    Name:   Placement date:   Placement time:   Site:   Days:   Peripheral IV 04/04/18 Left Hand   04/04/18    0531    Hand   less than 1   Peripheral IV 04/04/18 Right Wrist   04/04/18    0546    Wrist   less than 1   Fistula / Graft Left Arteriovenous fistula   -    -    -      Fistula / Graft Left Upper arm   -    -    Upper arm      Sheath 01/18/18 Left Femoral;Arterial   01/18/18    1114    Femoral;Arterial   76   Wound / Incision (Open or Dehisced) 11/19/17 Other (Comment) Anterior;Left 1 cm x 1.5 cm   11/19/17    0656    -   136   Wound / Incision (Open or Dehisced) 01/20/18 Venous stasis ulcer Tibial Posterior;Right venous stasis ulcer, mostly healed   01/20/18    1230    Tibial   74   Wound / Incision (Open  or Dehisced) 01/20/18 Non-pressure wound Toe (Comment  which one) Anterior;Right ulcer on outer surface of toe, purulent drainage   01/20/18    1230    Toe (Comment  which one)   74   Wound / Incision (Open or Dehisced) 01/20/18 Venous stasis ulcer Heel Right   01/20/18    1300    Heel   74   Wound / Incision (Open or Dehisced) 01/20/18 Venous stasis ulcer Ankle Posterior;Right   01/20/18    1300    Ankle   74          Labs/Imaging Results for orders placed or performed during the hospital encounter of 04/04/18 (from the past 48 hour(s))  CBC with Differential     Status: Abnormal   Collection Time: 04/04/18  5:34 AM  Result Value Ref Range   WBC 11.7 (H) 4.0 - 10.5 K/uL   RBC 3.24 (L) 4.22 - 5.81 MIL/uL   Hemoglobin 10.1 (L) 13.0 - 17.0 g/dL   HCT 32.8 (L) 39.0 - 52.0 %   MCV 101.2 (H) 80.0 - 100.0 fL   MCH 31.2 26.0 - 34.0 pg   MCHC 30.8 30.0 - 36.0 g/dL   RDW 16.5 (H)  11.5 - 15.5 %   Platelets 300 150 - 400 K/uL   nRBC 0.0 0.0 - 0.2 %   Neutrophils Relative % 69 %   Neutro Abs 8.0 (H) 1.7 - 7.7 K/uL   Lymphocytes Relative 18 %   Lymphs Abs 2.1 0.7 - 4.0 K/uL   Monocytes Relative 9 %   Monocytes Absolute 1.1 (H) 0.1 - 1.0 K/uL   Eosinophils Relative 2 %   Eosinophils Absolute 0.3 0.0 - 0.5 K/uL   Basophils Relative 1 %   Basophils Absolute 0.1 0.0 - 0.1 K/uL   Immature Granulocytes 1 %   Abs Immature Granulocytes 0.09 (H) 0.00 - 0.07 K/uL    Comment: Performed at [facility], [address] [place], [place] [address]  Comprehensive metabolic panel     Status: Abnormal   Collection Time: 04/04/18  5:34 AM  Result Value Ref Range   Sodium 140 135 - 145 mmol/L   Potassium 5.4 (H) 3.5 - 5.1 mmol/L   Chloride 100 98 - 111 mmol/L   CO2 24 22 - 32 mmol/L   Glucose, Bld 82 70 - 99 mg/dL   BUN 59 (H) 8 - 23 mg/dL   Creatinine, Ser 9.50 (H) 0.61 - 1.24 mg/dL   Calcium 10.0 8.9 - 10.3 mg/dL   Total Protein 6.8 6.5 - 8.1 g/dL   Albumin 2.9 (L) 3.5 - 5.0 g/dL   AST 18 15 - 41 U/L   ALT 5 0 - 44 U/L   Alkaline Phosphatase 70 38 - 126 U/L   Total Bilirubin 0.4 0.3 - 1.2 mg/dL   GFR calc non Af Amer 5 (L) >60 mL/min   GFR calc Af Amer 6 (L) >60 mL/min   Anion gap 16 (H) 5 - 15    Comment: Performed at [facility], [address] [place], [place] [address]  Troponin I - Once     Status: Abnormal   Collection Time: 04/04/18  5:34 AM  Result Value Ref Range   Troponin I 0.05 (HH) <0.03 ng/mL    Comment: CRITICAL RESULT CALLED TO, READ BACK BY AND VERIFIED WITH HENRY RIVERA ON 04/04/18 AT 3220 QSD Performed at [facility], [address], [place],  [address]   CG4 I-STAT (  Lactic acid)     Status: None   Collection Time: 04/04/18  5:57 AM  Result Value Ref Range   Lactic Acid, Venous 1.28 0.5 - 1.9 mmol/L  Ammonia     Status: None   Collection Time: 04/04/18  6:24 AM  Result Value Ref Range   Ammonia 21 9 -  35 umol/L    Comment: Performed at [facility], [address] [place],  [address]  Troponin I - Once-Timed     Status: Abnormal   Collection Time: 04/04/18  8:30 AM  Result Value Ref Range   Troponin I 0.05 (HH) <0.03 ng/mL    Comment: Performed at [facility], [place],  [address]   Ct Head Wo Contrast  Result Date: 04/04/2018 CLINICAL DATA:  Tremors and confusion EXAM: CT HEAD WITHOUT CONTRAST TECHNIQUE: Contiguous axial images were obtained from the base of the skull through the vertex without intravenous contrast. COMPARISON:  01/20/2018 MRI FINDINGS: Brain: Mild atrophic changes and chronic white matter ischemic changes are noted similar to that seen on prior exam and commensurate with the patient's given age. Vascular: No hyperdense vessel or unexpected calcification. Skull: Normal. Negative for fracture or focal lesion. Sinuses/Orbits: No acute finding. Other: None. IMPRESSION: Chronic atrophic and ischemic changes without acute intracranial abnormality Electronically Signed   By: Inez Catalina M.D.   On: 04/04/2018 07:26   Dg Chest Port 1 View  Result Date: 04/04/2018 CLINICAL DATA:  Tremors and confusion. EXAM: PORTABLE CHEST 1 VIEW COMPARISON:  Radiographs 02/28/2018 FINDINGS: Right-sided dialysis catheter in the mid SVC. Upper normal heart size with mild aortic tortuosity. Aortic atherosclerosis. No focal airspace disease, pulmonary edema, pleural effusion or pneumothorax. Vascular stents in the left axilla. IMPRESSION: No acute pulmonary process. Electronically Signed   By: Keith Rake M.D.   On: 04/04/2018 06:10    Pending Labs Unresulted Labs (From admission, onward)    Start     Ordered   04/09/18 0500  Vancomycin, random  Once-Timed,   STAT     04/04/18 0908   04/04/18 0526  Urinalysis, Complete w Microscopic  ONCE - STAT,   STAT     04/04/18 0525   04/04/18 0526  Culture, blood (routine x 2)  BLOOD CULTURE X 2,   STAT      04/04/18 0525   Signed and Held  Basic metabolic panel  Tomorrow morning,   R     Signed and Held   Signed and Held  CBC  Tomorrow morning,   R     Signed and Held          Vitals/Pain Today's Vitals   04/04/18 0730 04/04/18 0800 04/04/18 0830 04/04/18 0900  BP: 131/68 113/77 129/70 (!) 116/96  Pulse:   74 77  Resp: 11 12 (!) 9 (!) 21  Temp:      TempSrc:      SpO2:   99% 99%  Weight:      Height:      PainSc:        Isolation Precautions No active isolations  Medications Medications  hydrALAZINE (APRESOLINE) injection 10 mg (10 mg Intravenous Not Given 04/04/18 0809)  metroNIDAZOLE (FLAGYL) IVPB 500 mg (500 mg Intravenous Transfusing/Transfer 04/04/18 0906)  Chlorhexidine Gluconate Cloth 2 % PADS 6 each (has no administration in time range)  vancomycin (VANCOCIN) 1,250 mg in sodium chloride 0.9 % 250 mL IVPB (has no administration in time range)  vancomycin (VANCOCIN) IVPB 750 mg/150  ml premix (has no administration in time range)  ceFEPIme (MAXIPIME) 1 g in sodium chloride 0.9 % 100 mL IVPB (has no administration in time range)  epoetin alfa (EPOGEN,PROCRIT) injection 4,000 Units (has no administration in time range)  ceFEPIme (MAXIPIME) 2 g in sodium chloride 0.9 % 100 mL IVPB ( Intravenous Stopped 04/04/18 0855)    Mobility Fall risk

## 2018-04-04 NOTE — ED Provider Notes (Signed)
Patient received in signout from Dr. Beather Arbour.  Patient does have persistent tremor and was fasciculations.  Low-grade temperature with no leukocytosis does have known osteomyelitis with severe PAD.  Will start broad-spectrum antibiotics due to concern for infection and as he is on dialysis that high risk for bacteremia and sepsis.  CT imaging shows no acute abnormality.  Tremor seems metabolic.  Will consult nephrology.  Will consult hospitalist for admission the hospital for further medical management.   Merlyn Lot, MD 04/04/18 [phone]

## 2018-04-04 NOTE — Care Management (Signed)
Elvera Bicker dialysis liaison notified of admission.   Patient open with [facility] health.  Malachy Mood with [facility] notified of admission. PT consult requested.  Home health agency has concerns of patient returning home alone

## 2018-04-04 NOTE — ED Triage Notes (Signed)
Pt arrived to the ED via EMS from home for complaints of tremors and confusion. Pt is a dialysis Pt and scheduled for it today. Pt reports that he can not stop shaking his upper body and is confused at moments. Pt is also scheduled for amputation of a toe on his right leg. Pt is alert answering questions upon arrival.

## 2018-04-04 NOTE — Progress Notes (Signed)
[facility], [place] 04/04/18  Subjective:   Patient known to our practice from outpatient dialysis.  He presents for generalized weakness, nausea, vomiting this morning, feeling lethargic.  He has neuromuscular twitching. Temperature 96.3 WBC count mildly elevated at 11.7 Reviewed his discharge summary from [facility] indicates gabapentin 300 mg 3 times per day  Objective:  Vital signs in last 24 hours:  Temp:  [96.3 F (35.7 C)-98 F (36.7 C)] 96.3 F (35.7 C) (11/27 0705) Pulse Rate:  [71-76] 74 (11/27 0830) Resp:  [9-18] 9 (11/27 0830) BP: (113-158)/(68-113) 129/70 (11/27 0830) SpO2:  [98 %-100 %] 99 % (11/27 0830) Weight:  [61 kg] 61 kg (11/27 0522)  Weight change:  Filed Weights   04/04/18 0522  Weight: 61 kg    Intake/Output:    Intake/Output Summary (Last 24 hours) at 04/04/2018 0904 Last data filed at 04/04/2018 0857 Gross per 24 hour  Intake 101.53 ml  Output -  Net 101.53 ml     Physical Exam: General:  No acute distress, laying in the bed  HEENT  moist oral mucous membranes  Neck  supple  Pulm/lungs  normal breathing effort, clear  CVS/Heart  irregular rhythm  Abdomen:   Soft, nontender  Extremities:  No edema, right leg bandage  Neurologic:  Lethargic but able to answer simple questions  Skin:  Skin  Access:  Right IJ PermCath       Basic Metabolic Panel:  Recent Labs  Lab 04/04/18 0534  NA 140  K 5.4*  CL 100  CO2 24  GLUCOSE 82  BUN 59*  CREATININE 9.50*  CALCIUM 10.0     CBC: Recent Labs  Lab 04/04/18 0534  WBC 11.7*  NEUTROABS 8.0*  HGB 10.1*  HCT 32.8*  MCV 101.2*  PLT 300      Lab Results  Component Value Date   HEPBSAG Negative 09/30/2015   HEPBSAB Reactive 01/29/2018      Microbiology:  No results found for this or any previous visit (from the past 240 hour(s)).  Coagulation Studies: No results for input(s): LABPROT, INR in the last 72 hours.  Urinalysis: No results for  input(s): COLORURINE, LABSPEC, PHURINE, GLUCOSEU, HGBUR, BILIRUBINUR, KETONESUR, PROTEINUR, UROBILINOGEN, NITRITE, LEUKOCYTESUR in the last 72 hours.  Invalid input(s): APPERANCEUR    Imaging: Ct Head Wo Contrast  Result Date: 04/04/2018 CLINICAL DATA:  Tremors and confusion EXAM: CT HEAD WITHOUT CONTRAST TECHNIQUE: Contiguous axial images were obtained from the base of the skull through the vertex without intravenous contrast. COMPARISON:  01/20/2018 MRI FINDINGS: Brain: Mild atrophic changes and chronic white matter ischemic changes are noted similar to that seen on prior exam and commensurate with the patient's given age. Vascular: No hyperdense vessel or unexpected calcification. Skull: Normal. Negative for fracture or focal lesion. Sinuses/Orbits: No acute finding. Other: None. IMPRESSION: Chronic atrophic and ischemic changes without acute intracranial abnormality Electronically Signed   By: Inez Catalina M.D.   On: 04/04/2018 07:26   Dg Chest Port 1 View  Result Date: 04/04/2018 CLINICAL DATA:  Tremors and confusion. EXAM: PORTABLE CHEST 1 VIEW COMPARISON:  Radiographs 02/28/2018 FINDINGS: Right-sided dialysis catheter in the mid SVC. Upper normal heart size with mild aortic tortuosity. Aortic atherosclerosis. No focal airspace disease, pulmonary edema, pleural effusion or pneumothorax. Vascular stents in the left axilla. IMPRESSION: No acute pulmonary process. Electronically Signed   By: Keith Rake M.D.   On: 04/04/2018 06:10     Medications:   . [START ON 04/05/2018] ceFEPime (  MAXIPIME) IV    . metronidazole 500 mg (04/04/18 0837)  . vancomycin    . vancomycin     . Chlorhexidine Gluconate Cloth  6 each Topical Q0600  . hydrALAZINE  10 mg Intravenous Once     Assessment/ Plan:  67 y.o. African-American male with end stage renal disease on hemodialysis, congestive heart failure, hypertension, peripheral vascular disease  [place]. MWF L AVF 61kg  1.   End-stage renal disease with hyperkalemia 2.  Anemia of chronic kidney disease- EPO with HD 3.  Secondary hyperparathyroidism- Continue home dose of binders with meals 4.  Altered mental status  We will plan for hemodialysis today for hyperkalemia as well as routine hemodialysis Altered mental status could be possibly related to gabapentin toxicity as he was getting high-dose Received IV antibiotics empirically We will follow    LOS: 0 Stpehen Petitjean Candiss Norse 11/27/20199:04 AM  [place], [place]  Note: This note was prepared with Dragon dictation. Any transcription errors are unintentional

## 2018-04-04 NOTE — ED Notes (Signed)
Critical high troponin of 0.05 Dr. Beather Arbour was notified.

## 2018-04-04 NOTE — Telephone Encounter (Signed)
Called in augmentin 875mg  bid #20 for 10 days with no refills. I called the nurse and the family friend back to let them know. The nurse stated that he was found unresponsive this morning.

## 2018-04-04 NOTE — Progress Notes (Signed)
Advanced care plan.  Purpose of the Encounter: CODE STATUS  Parties in Attendance: Patient is  Patient's Decision Capacity: Good  Subjective/Patient's story: Presented to emergency room for tremors and lethargy   Objective/Medical story Has change in mental status Is hypothermic Needs evaluation and dialysis   Goals of care determination:  Advance care directives and goals of care discussed Patient wants everything done which includes cpr, intubation, ventilator if need arises   CODE STATUS: Full code.   Time spent discussing advanced care planning: 16 minutes

## 2018-04-04 NOTE — Progress Notes (Signed)
Post HD Treatment    04/04/18 1705  Hand-Off documentation  Report given to (Full Name) Lawson Fiscal, RN  Report received from (Full Name) Stephannie Peters, RN  Vital Signs  Temp 97.9 F (36.6 C)  Temp Source Oral  Pulse Rate (!) 176  Pulse Rate Source Monitor  Resp 12  BP (!) 147/134  BP Location Right Wrist  BP Method Automatic  Oxygen Therapy  SpO2 94 %  O2 Device Room Air  Pain Assessment  Pain Scale 0-10  Pain Score 0  Dialysis Weight  Weight 65.1 kg  Type of Weight Post-Dialysis  Post-Hemodialysis Assessment  Rinseback Volume (mL) 250 mL  KECN 258 V  Dialyzer Clearance Lightly streaked  Duration of HD Treatment -hour(s) 3.5 hour(s)  Hemodialysis Intake (mL) 600 mL  UF Total -Machine (mL) 2126 mL  Net UF (mL) 1526 mL  Tolerated HD Treatment Yes  Post-Hemodialysis Comments Pt tolerated treatment well  Hemodialysis Catheter Right Subclavian Double-lumen  Placement Date/Time: (c) (c)   Placed prior to admission: No  Orientation: Right  Access Location: Subclavian  Hemodialysis Catheter Type: Double-lumen  Site Condition No complications  Blue Lumen Status Capped (Central line)  Red Lumen Status Capped (Central line)  Purple Lumen Status N/A  Catheter fill solution Heparin 1000 units/ml  Catheter fill volume (Arterial) 1.8 cc  Catheter fill volume (Venous) 1.9  Dressing Type Biopatch;Occlusive  Dressing Status Clean;Dry;Intact;Dressing changed;Antimicrobial disc changed  Interventions New dressing  Drainage Description None  Dressing Change Due 04/11/18  Post treatment catheter status Capped and Clamped

## 2018-04-04 NOTE — Progress Notes (Signed)
HD Treatment Complete    04/04/18 1700  Vital Signs  Pulse Rate 87  Pulse Rate Source Monitor  Resp 12  BP (!) 128/118  BP Location Right Wrist  BP Method Automatic  Patient Position (if appropriate) Lying  Oxygen Therapy  SpO2 100 %  O2 Device Room Air  During Hemodialysis Assessment  Blood Flow Rate (mL/min) 400 mL/min  Arterial Pressure (mmHg) -210 mmHg  Venous Pressure (mmHg) 180 mmHg  Transmembrane Pressure (mmHg) 50 mmHg  Ultrafiltration Rate (mL/min) 700 mL/min  Dialysate Flow Rate (mL/min) 800 ml/min  Conductivity: Machine  14  HD Safety Checks Performed Yes  Intra-Hemodialysis Comments Tolerated well;Tx completed (UF 2126)

## 2018-04-04 NOTE — Progress Notes (Addendum)
HD Treatment Initiated    04/04/18 1315  Vital Signs  Pulse Rate 69  Pulse Rate Source Monitor  Resp 12  BP (!) 165/102  BP Location Right Wrist  BP Method Automatic  Patient Position (if appropriate) Lying  Oxygen Therapy  SpO2 100 %  O2 Device Room Air  During Hemodialysis Assessment  Blood Flow Rate (mL/min) 400 mL/min  Arterial Pressure (mmHg) -140 mmHg  Venous Pressure (mmHg) 150 mmHg  Transmembrane Pressure (mmHg) 60 mmHg  Ultrafiltration Rate (mL/min) 570 mL/min  Dialysate Flow Rate (mL/min) 800 ml/min  Conductivity: Machine  13.9  HD Safety Checks Performed Yes  Dialysis Fluid Bolus Normal Saline  Bolus Amount (mL) 250 mL  Intra-Hemodialysis Comments Tx initiated;Progressing as prescribed  Education / Care Plan  Dialysis Education Provided Yes  Documented Education in Care Plan Yes  Note  Observations Pt asleep  Hemodialysis Catheter Right Subclavian Double-lumen  Placement Date/Time: (c) (c)   Placed prior to admission: No  Orientation: Right  Access Location: Subclavian  Hemodialysis Catheter Type: Double-lumen  Blue Lumen Status Infusing  Red Lumen Status Infusing

## 2018-04-04 NOTE — H&P (Addendum)
[name]    MR#:  [id number]  DATE OF BIRTH:  04/07/1951  DATE OF ADMISSION:  04/04/2018  PRIMARY CARE PHYSICIAN: [name], MD   REQUESTING/REFERRING PHYSICIAN:   CHIEF COMPLAINT:   Chief Complaint  Patient presents with  . Tremors  . Altered Mental Status    HISTORY OF PRESENT ILLNESS: Johnny [name]  is a 67 y.o. male with a known history of pretension, hyperlipidemia, peripheral vascular disease, chronic congestive heart failure, end-stage renal disease on dialysis presented to the emergency room for lethargy and confusion.  Patient also complaining of increased tremors in the upper extremities.  He was evaluated in the emergency room found to have low body temperature.  Patient was put on warming blanket.  Patient has history of peripheral arterial disease with osteomyelitis of the left foot.  On 04/02/2018 he underwent angiogram with PCTA of the left posterior tibial artery and tibioperoneal trunk distal SFA and above-knee popliteal artery left common femoral artery and proximal SFA.  Patient has been scheduled for partial foot amputation in December.  Hospitalist service was consulted.  PAST MEDICAL HISTORY:   Past Medical History:  Diagnosis Date  . Anemia   . Anxiety   . CHF (congestive heart failure) ([place])   . Chronic kidney disease   . Gout   . Hyperlipidemia   . Hypertension   . Peripheral vascular disease ([place])     PAST SURGICAL HISTORY:  Past Surgical History:  Procedure Laterality Date  . A/V SHUNTOGRAM Left 06/21/2017   Procedure: A/V SHUNTOGRAM;  Surgeon: Katha Cabal, MD;  Location: [facility];  Service: Cardiovascular;  Laterality: Left;  . AV FISTULA PLACEMENT Left 09/18/2015   Procedure: INSERTION OF ARTERIOVENOUS (AV) GORE-TEX GRAFT ARM ( BRACH/AXILLARY GRAFT W/ INSTANT STICK GRAFT );  Surgeon: Katha Cabal, MD;  Location: [facility];  Service:  Vascular;  Laterality: Left;  . DIALYSIS FISTULA CREATION    . ESOPHAGOGASTRODUODENOSCOPY N/A 12/19/2017   Procedure: ESOPHAGOGASTRODUODENOSCOPY (EGD);  Surgeon: Lin Landsman, MD;  Location: [facility];  Service: Gastroenterology;  Laterality: N/A;  . LOWER EXTREMITY ANGIOGRAPHY Left 11/16/2017   Procedure: LOWER EXTREMITY ANGIOGRAPHY;  Surgeon: Algernon Huxley, MD;  Location: [facility];  Service: Cardiovascular;  Laterality: Left;  . LOWER EXTREMITY ANGIOGRAPHY Right 01/18/2018   Procedure: LOWER EXTREMITY ANGIOGRAPHY;  Surgeon: Algernon Huxley, MD;  Location: [place] [facility];  Service: Cardiovascular;  Laterality: Right;  . LOWER EXTREMITY ANGIOGRAPHY Left 04/02/2018   Procedure: LOWER EXTREMITY ANGIOGRAPHY;  Surgeon: Algernon Huxley, MD;  Location: [facility];  Service: Cardiovascular;  Laterality: Left;  . PERIPHERAL VASCULAR CATHETERIZATION Left 09/01/2015   Procedure: A/V Shuntogram/Fistulagram;  Surgeon: Katha Cabal, MD;  Location: [place] [facility];  Service: Cardiovascular;  Laterality: Left;  . PERIPHERAL VASCULAR CATHETERIZATION N/A 09/30/2015   Procedure: A/V Shuntogram/Fistulagram with perm cathether removal;  Surgeon: Algernon Huxley, MD;  Location: [place] [facility];  Service: Cardiovascular;  Laterality: N/A;  . PERIPHERAL VASCULAR CATHETERIZATION Left 09/30/2015   Procedure: A/V Shunt Intervention;  Surgeon: Algernon Huxley, MD;  Location: [place];  Service: Cardiovascular;  Laterality: Left;  . PERIPHERAL VASCULAR CATHETERIZATION Left 12/03/2015   Procedure: Thrombectomy;  Surgeon: Algernon Huxley, MD;  Location: [place] [facility];  Service: Cardiovascular;  Laterality: Left;  . PERIPHERAL VASCULAR CATHETERIZATION Left 01/28/2016   Procedure: Thrombectomy;  Surgeon: Algernon Huxley, MD;  Location: [facility];  Service: Cardiovascular;  Laterality: Left;  . PERIPHERAL VASCULAR CATHETERIZATION N/A 01/28/2016   Procedure: A/V  Shuntogram/Fistulagram;  Surgeon: Algernon Huxley, MD;  Location: [facility];  Service: Cardiovascular;  Laterality: N/A;    SOCIAL HISTORY:  Social History   Tobacco Use  . Smoking status: Former Smoker    Types: Cigarettes  . Smokeless tobacco: Never Used  Substance Use Topics  . Alcohol use: No    FAMILY HISTORY:  Family History  Problem Relation Age of Onset  . Hypertension Unknown   . Heart disease Unknown   . Diabetes Mother     DRUG ALLERGIES:  Allergies  Allergen Reactions  . Shellfish Allergy Anaphylaxis    REVIEW OF SYSTEMS:   CONSTITUTIONAL: No fever, has fatigue and weakness.  EYES: No blurred or double vision.  EARS, NOSE, AND THROAT: No tinnitus or ear pain.  RESPIRATORY: No cough, shortness of breath, wheezing or hemoptysis.  CARDIOVASCULAR: No chest pain, orthopnea, edema.  GASTROINTESTINAL: No nausea, vomiting, diarrhea or abdominal pain.  GENITOURINARY: No dysuria, hematuria.  ENDOCRINE: No polyuria, nocturia,  HEMATOLOGY: No anemia, easy bruising or bleeding SKIN: right leg wound MUSCULOSKELETAL: No joint pain or arthritis.   Tremors NEUROLOGIC: No tingling, numbness, weakness.  PSYCHIATRY: No anxiety or depression.   MEDICATIONS AT HOME:  Prior to Admission medications   Medication Sig Start Date End Date Taking? Authorizing Provider  allopurinol (ZYLOPRIM) 100 MG tablet Take 100 mg by mouth daily.   Yes [provider]  amLODipine (NORVASC) 10 MG tablet Take 10 mg by mouth at bedtime.    Yes [provider]  apixaban (ELIQUIS) 2.5 MG TABS tablet Take 2.5 mg by mouth 2 (two) times daily. 03/28/18 05/27/18 Yes [provider]  aspirin EC 81 MG tablet Take 1 tablet (81 mg total) by mouth daily. 04/02/18  Yes Dew, Erskine Squibb, MD  atorvastatin (LIPITOR) 10 MG tablet Take 1 tablet by mouth daily. 11/23/17  Yes [provider]  calcium acetate (PHOSLO) 667 MG capsule Take 1,334 mg by mouth 3 (three) times daily  with meals.   Yes [provider]  clonazePAM (KLONOPIN) 0.5 MG tablet Take 0.5 mg by mouth as needed. 03/15/18  Yes [provider]  clopidogrel (PLAVIX) 75 MG tablet Take 1 tablet (75 mg total) by mouth daily. 04/02/18  Yes Dew, Erskine Squibb, MD  colchicine 0.6 MG tablet Take 0.6 mg by mouth daily as needed (for gout flares).    Yes [provider]  folic acid-vitamin b complex-vitamin c-selenium-zinc (DIALYVITE) 3 MG TABS tablet Take by mouth.   Yes [provider]  gabapentin (NEURONTIN) 100 MG capsule Take 300 mg by mouth 3 (three) times daily. 03/28/18  Yes [provider]  HYDROcodone-acetaminophen (NORCO/VICODIN) 5-325 MG tablet Take 1 tablet by mouth every 4 (four) hours as needed for moderate pain. 02/01/18  Yes Henreitta Leber, MD  Melatonin 3 MG TABS Take 3 mg by mouth every evening. 03/28/18 05/27/18 Yes [provider]  metoprolol succinate (TOPROL-XL) 25 MG 24 hr tablet Take 25 mg by mouth at bedtime. 03/02/18  Yes [provider]  furosemide (LASIX) 40 MG tablet Take 40 mg by mouth daily.     [provider]      PHYSICAL EXAMINATION:   VITAL SIGNS: Blood pressure (!) 165/102, pulse 69, temperature 98 F (36.7 C), temperature source Oral, resp. rate 12, height 5\' 8"  (1.727  m), weight 61 kg, SpO2 100 %.  GENERAL:  67 y.o.-year-old patient lying in the bed with no acute distress.  EYES: Pupils equal, round, reactive to light and accommodation. No scleral icterus. Extraocular muscles intact.  HEENT: Head atraumatic, normocephalic. Oropharynx dry and nasopharynx clear.  NECK:  Supple, no jugular venous distention. No thyroid enlargement, no tenderness.  LUNGS: Normal breath sounds bilaterally, no wheezing, rales,rhonchi or crepitation. No use of accessory muscles of respiration.  CARDIOVASCULAR: S1, S2 normal. No murmurs, rubs, or gallops.  ABDOMEN: Soft, nontender, nondistended. Bowel sounds present. No organomegaly  or mass.  EXTREMITIES: No pedal edema, cyanosis, or clubbing.  right leg wound NEUROLOGIC: Cranial nerves II through XII are intact. Muscle strength 5/5 in all extremities. Sensation intact. Gait not checked.  PSYCHIATRIC: The patient is alert and oriented x 2.  SKIN: No obvious rash, lesion, or ulcer.   LABORATORY PANEL:   CBC Recent Labs  Lab 04/04/18 0534  WBC 11.7*  HGB 10.1*  HCT 32.8*  PLT 300  MCV 101.2*  MCH 31.2  MCHC 30.8  RDW 16.5*  LYMPHSABS 2.1  MONOABS 1.1*  EOSABS 0.3  BASOSABS 0.1   ------------------------------------------------------------------------------------------------------------------  Chemistries  Recent Labs  Lab 04/04/18 0534  NA 140  K 5.4*  CL 100  CO2 24  GLUCOSE 82  BUN 59*  CREATININE 9.50*  CALCIUM 10.0  AST 18  ALT 5  ALKPHOS 70  BILITOT 0.4   ------------------------------------------------------------------------------------------------------------------ estimated creatinine clearance is 6.3 mL/min (A) (by C-G formula based on SCr of 9.5 mg/dL (H)). ------------------------------------------------------------------------------------------------------------------ No results for input(s): TSH, T4TOTAL, T3FREE, THYROIDAB in the last 72 hours.  Invalid input(s): FREET3   Coagulation profile No results for input(s): INR, PROTIME in the last 168 hours. ------------------------------------------------------------------------------------------------------------------- No results for input(s): DDIMER in the last 72 hours. -------------------------------------------------------------------------------------------------------------------  Cardiac Enzymes Recent Labs  Lab 04/04/18 0534 04/04/18 0830  TROPONINI 0.05* 0.05*   ------------------------------------------------------------------------------------------------------------------ Invalid input(s):  POCBNP  ---------------------------------------------------------------------------------------------------------------  Urinalysis No results found for: COLORURINE, APPEARANCEUR, LABSPEC, PHURINE, GLUCOSEU, HGBUR, BILIRUBINUR, KETONESUR, PROTEINUR, UROBILINOGEN, NITRITE, LEUKOCYTESUR   RADIOLOGY: Ct Head Wo Contrast  Result Date: 04/04/2018 CLINICAL DATA:  Tremors and confusion EXAM: CT HEAD WITHOUT CONTRAST TECHNIQUE: Contiguous axial images were obtained from the base of the skull through the vertex without intravenous contrast. COMPARISON:  01/20/2018 MRI FINDINGS: Brain: Mild atrophic changes and chronic white matter ischemic changes are noted similar to that seen on prior exam and commensurate with the patient's given age. Vascular: No hyperdense vessel or unexpected calcification. Skull: Normal. Negative for fracture or focal lesion. Sinuses/Orbits: No acute finding. Other: None. IMPRESSION: Chronic atrophic and ischemic changes without acute intracranial abnormality Electronically Signed   By: Inez Catalina M.D.   On: 04/04/2018 07:26   Dg Chest Port 1 View  Result Date: 04/04/2018 CLINICAL DATA:  Tremors and confusion. EXAM: PORTABLE CHEST 1 VIEW COMPARISON:  Radiographs 02/28/2018 FINDINGS: Right-sided dialysis catheter in the mid SVC. Upper normal heart size with mild aortic tortuosity. Aortic atherosclerosis. No focal airspace disease, pulmonary edema, pleural effusion or pneumothorax. Vascular stents in the left axilla. IMPRESSION: No acute pulmonary process. Electronically Signed   By: Keith Rake M.D.   On: 04/04/2018 06:10    EKG: Orders placed or performed during the hospital encounter of 04/04/18  . ED EKG  . ED EKG    IMPRESSION AND PLAN:  67 year old male patient with history of peripheral arterial disease, osteomyelitis of the left foot, recent angiogram and left common femoral artery and  proximal SFA, hypertension, gout, chronic congestive heart failure,  peripheral vascular disease presented to the emergency room for lethargy  -Altered mental status and tremors Probably secondary to gabapentin toxicity Hold Neurontin  -Hypothermia Possible SIRS Warming blanket Start patient on Vanco and cefepime broad-spectrum IV antibiotics Check for cultures  -Mild hyperkalemia Dialysis as per nephrology  -End-stage renal disease Continue dialysis as per nephrology   -DVT prophylaxis On oral Eliquis for anticoagulation  -Peripheral vascular disease Continue aspirin and statin  -Right lower extremity wound Vascular surgery follow-up as outpatient   All the records are reviewed and case discussed with ED provider. Management plans discussed with the patient, family and they are in agreement.  CODE STATUS:Full code    Code Status Orders  (From admission, onward)         Start     Ordered   04/04/18 1027  Full code  Continuous     04/04/18 1027        Code Status History    Date Active Date Inactive Code Status Order ID Comments User Context   04/02/2018 1025 04/02/2018 1537 Full Code [id number]  Algernon Huxley, MD Inpatient   01/29/2018 1316 02/01/2018 2154 Full Code [id number]  Nicholes Mango, MD Inpatient   01/20/2018 1233 01/24/2018 2031 Full Code [id number]  MayoPete Pelt, MD Inpatient   01/18/2018 1230 01/19/2018 1018 Full Code [id number]  Algernon Huxley, MD Inpatient   12/19/2017 0112 12/25/2017 2152 Full Code [id number]  Lance Coon, MD Inpatient   11/27/2017 1831 12/01/2017 2320 Full Code [id number]  Saundra Shelling, MD Inpatient   11/19/2017 0507 11/21/2017 0009 Full Code [id number]  Harrie Foreman, MD Inpatient   11/16/2017 1205 11/16/2017 1717 Full Code [id number]  Algernon Huxley, MD Inpatient   01/28/2016 1050 01/28/2016 1456 Full Code [id number]  Algernon Huxley, MD Inpatient   09/28/2015 2306 10/02/2015 1738 Full Code [id number]  Lance Coon, MD Inpatient    Advance Directive Documentation     Most Recent Value  Type of Advance Directive   Living will  Pre-existing out of facility DNR order (yellow form or pink MOST form)  -  "MOST" Form in Place?  -       TOTAL TIME TAKING CARE OF THIS PATIENT: 52 minutes.    Saundra Shelling M.D on 04/04/2018 at 1:33 PM  Between 7am to 6pm - Pager - [phone]  After 6pm go to [email or web address] - password [email or web address] [facility]  Office  [phone]  CC: Primary care physician; [name], MD

## 2018-04-05 LAB — BASIC METABOLIC PANEL
Anion gap: 12 (ref 5–15)
BUN: 24 mg/dL — AB (ref 8–23)
CO2: 26 mmol/L (ref 22–32)
CREATININE: 5.44 mg/dL — AB (ref 0.61–1.24)
Calcium: 9.1 mg/dL (ref 8.9–10.3)
Chloride: 100 mmol/L (ref 98–111)
GFR, EST AFRICAN AMERICAN: 11 mL/min — AB (ref >60)
GFR, EST NON AFRICAN AMERICAN: 10 mL/min — AB (ref >60)
Glucose, Bld: 68 mg/dL — ABNORMAL LOW (ref 70–99)
Potassium: 4.3 mmol/L (ref 3.5–5.1)
SODIUM: 138 mmol/L (ref 135–145)

## 2018-04-05 LAB — CBC
HCT: 32.6 % — ABNORMAL LOW (ref 39.0–52.0)
Hemoglobin: 9.9 g/dL — ABNORMAL LOW (ref 13.0–17.0)
MCH: 30.5 pg (ref 26.0–34.0)
MCHC: 30.4 g/dL (ref 30.0–36.0)
MCV: 100.3 fL — ABNORMAL HIGH (ref 80.0–100.0)
NRBC: 0 % (ref 0.0–0.2)
Platelets: 216 10*3/uL (ref 150–400)
RBC: 3.25 MIL/uL — ABNORMAL LOW (ref 4.22–5.81)
RDW: 16.4 % — AB (ref 11.5–15.5)
WBC: 12.4 10*3/uL — AB (ref 4.0–10.5)

## 2018-04-05 MED ORDER — GABAPENTIN 300 MG PO CAPS
300.0000 mg | ORAL_CAPSULE | Freq: Every day | ORAL | Status: DC
  Administered 2018-04-05 – 2018-04-12 (×8): 300 mg via ORAL
  Filled 2018-04-05 (×9): qty 1

## 2018-04-05 NOTE — Progress Notes (Signed)
[facility], [place] 04/05/18  Subjective:   Doing much better this morning.  Fully alert and mental status is back to baseline Tolerated hemodialysis well yesterday.  1500 cc of fluid was removed  Objective:  Vital signs in last 24 hours:  Temp:  [97.5 F (36.4 C)-98.5 F (36.9 C)] 98.2 F (36.8 C) (11/28 0610) Pulse Rate:  [48-176] 84 (11/28 0610) Resp:  [12-22] 20 (11/28 0610) BP: (96-181)/(59-152) 119/65 (11/28 0610) SpO2:  [84 %-100 %] 100 % (11/28 0610) Weight:  [65.1 kg-66.9 kg] 65.1 kg (11/27 1705)  Weight change: 5.9 kg Filed Weights   04/04/18 0522 04/04/18 1300 04/04/18 1705  Weight: 61 kg 66.9 kg 65.1 kg    Intake/Output:    Intake/Output Summary (Last 24 hours) at 04/05/2018 1144 Last data filed at 04/05/2018 0948 Gross per 24 hour  Intake 60 ml  Output 1526 ml  Net -1466 ml     Physical Exam: General:  No acute distress, laying in the bed  HEENT  moist oral mucous membranes  Neck  supple  Pulm/lungs  normal breathing effort, clear  CVS/Heart  irregular rhythm  Abdomen:   Soft, nontender  Extremities:  No edema,  Rt leg bandage  Neurologic:  alert oriented  Skin:  Skin  Access:  Right IJ PermCath       Basic Metabolic Panel:  Recent Labs  Lab 04/04/18 0534 04/05/18 0530  NA 140 138  K 5.4* 4.3  CL 100 100  CO2 24 26  GLUCOSE 82 68*  BUN 59* 24*  CREATININE 9.50* 5.44*  CALCIUM 10.0 9.1     CBC: Recent Labs  Lab 04/04/18 0534 04/05/18 0530  WBC 11.7* 12.4*  NEUTROABS 8.0*  --   HGB 10.1* 9.9*  HCT 32.8* 32.6*  MCV 101.2* 100.3*  PLT 300 216      Lab Results  Component Value Date   HEPBSAG Negative 09/30/2015   HEPBSAB Reactive 01/29/2018      Microbiology:  Recent Results (from the past 240 hour(s))  Culture, blood (routine x 2)     Status: None (Preliminary result)   Collection Time: 04/04/18  5:34 AM  Result Value Ref Range Status   Specimen Description BLOOD BLOOD RIGHT HAND   Final   Special Requests   Final    BOTTLES DRAWN AEROBIC AND ANAEROBIC Blood Culture adequate volume   Culture   Final    NO GROWTH 1 DAY Performed at [facility], [address], [place], [place] [address]    Report Status PENDING  Incomplete  Culture, blood (routine x 2)     Status: None (Preliminary result)   Collection Time: 04/04/18  5:35 AM  Result Value Ref Range Status   Specimen Description BLOOD BLOOD LEFT HAND  Final   Special Requests   Final    BOTTLES DRAWN AEROBIC AND ANAEROBIC Blood Culture adequate volume   Culture   Final    NO GROWTH 1 DAY Performed at [facility], [address] [place], [place] [address]    Report Status PENDING  Incomplete  MRSA PCR Screening     Status: None   Collection Time: 04/04/18  6:22 PM  Result Value Ref Range Status   MRSA by PCR NEGATIVE NEGATIVE Final    Comment:        The GeneXpert MRSA Assay (FDA approved for NASAL specimens only), is one component of a comprehensive MRSA colonization surveillance program. It is not intended to diagnose  MRSA infection nor to guide or monitor treatment for MRSA infections. Performed at [facility], [address] [place], [place] [address]     Coagulation Studies: No results for input(s): LABPROT, INR in the last 72 hours.  Urinalysis: No results for input(s): COLORURINE, LABSPEC, PHURINE, GLUCOSEU, HGBUR, BILIRUBINUR, KETONESUR, PROTEINUR, UROBILINOGEN, NITRITE, LEUKOCYTESUR in the last 72 hours.  Invalid input(s): APPERANCEUR    Imaging: Ct Head Wo Contrast  Result Date: 04/04/2018 CLINICAL DATA:  Tremors and confusion EXAM: CT HEAD WITHOUT CONTRAST TECHNIQUE: Contiguous axial images were obtained from the base of the skull through the vertex without intravenous contrast. COMPARISON:  01/20/2018 MRI FINDINGS: Brain: Mild atrophic changes and chronic white matter ischemic changes are noted similar to that seen on prior exam and commensurate with  the patient's given age. Vascular: No hyperdense vessel or unexpected calcification. Skull: Normal. Negative for fracture or focal lesion. Sinuses/Orbits: No acute finding. Other: None. IMPRESSION: Chronic atrophic and ischemic changes without acute intracranial abnormality Electronically Signed   By: Inez Catalina M.D.   On: 04/04/2018 07:26   Dg Chest Port 1 View  Result Date: 04/04/2018 CLINICAL DATA:  Tremors and confusion. EXAM: PORTABLE CHEST 1 VIEW COMPARISON:  Radiographs 02/28/2018 FINDINGS: Right-sided dialysis catheter in the mid SVC. Upper normal heart size with mild aortic tortuosity. Aortic atherosclerosis. No focal airspace disease, pulmonary edema, pleural effusion or pneumothorax. Vascular stents in the left axilla. IMPRESSION: No acute pulmonary process. Electronically Signed   By: Keith Rake M.D.   On: 04/04/2018 06:10     Medications:   . sodium chloride 250 mL (04/05/18 0814)  . ceFEPime (MAXIPIME) IV 1 g (04/05/18 1011)  . metronidazole 500 mg (04/05/18 0817)   . allopurinol  100 mg Oral Daily  . amLODipine  10 mg Oral QHS  . apixaban  2.5 mg Oral BID  . aspirin EC  81 mg Oral Daily  . atorvastatin  10 mg Oral Daily  . calcium acetate  1,334 mg Oral TID WC  . Chlorhexidine Gluconate Cloth  6 each Topical Q0600  . clopidogrel  75 mg Oral Daily  . epoetin (EPOGEN/PROCRIT) injection  4,000 Units Intravenous Q M,W,F-HD  . furosemide  40 mg Oral Daily  . gabapentin  300 mg Oral QHS  . hydrALAZINE  10 mg Intravenous Once  . Melatonin  2.5 mg Oral QPM  . metoprolol succinate  25 mg Oral QHS  . multivitamin  1 tablet Oral QHS  . sodium chloride flush  3 mL Intravenous Q12H     Assessment/ Plan:  67 y.o. African-American male with end stage renal disease on hemodialysis, congestive heart failure, hypertension, peripheral vascular disease  [place]. MWF L AVF 61kg  1.  End-stage renal disease with hyperkalemia 2.  Anemia of chronic kidney  disease- EPO with HD 3.  Secondary hyperparathyroidism- Continue home dose of binders with meals 4.  Altered mental status  Potassium level is now corrected Patient mental status appears to be back to baseline Next hemodialysis to be scheduled for tomorrow    LOS: 1 Johnny [name] 11/28/201911:44 AM  [place], [place]  Note: This note was prepared with Dragon dictation. Any transcription errors are unintentional

## 2018-04-05 NOTE — Progress Notes (Addendum)
[facility] [place] NAME: Johnny [name]    MR#:  [id number]  DATE OF BIRTH:  Dec 28, 1950  SUBJECTIVE:  CHIEF COMPLAINT:   Chief Complaint  Patient presents with  . Tremors  . Altered Mental Status  Been seen and evaluated today Decreased tremors from upper extremity Mental status back to baseline No confusion No complaints of any chest pain  REVIEW OF SYSTEMS:    ROS  CONSTITUTIONAL: No documented fever.  Decreased fatigue, weakness. No weight gain, no weight loss.  EYES: No blurry or double vision.  ENT: No tinnitus. No postnasal drip. No redness of the oropharynx.  RESPIRATORY: No cough, no wheeze, no hemoptysis. No dyspnea.  CARDIOVASCULAR: No chest pain. No orthopnea. No palpitations. No syncope.  GASTROINTESTINAL: No nausea, no vomiting or diarrhea. No abdominal pain. No melena or hematochezia.  GENITOURINARY: No dysuria or hematuria.  ENDOCRINE: No polyuria or nocturia. No heat or cold intolerance.  HEMATOLOGY: No anemia. No bruising. No bleeding.  INTEGUMENTARY: No rashes. No lesions.  MUSCULOSKELETAL: No arthritis. No swelling. No gout.  right leg wound NEUROLOGIC: No numbness, tingling, or ataxia. No seizure-type activity.  PSYCHIATRIC: No anxiety. No insomnia. No ADD.   DRUG ALLERGIES:   Allergies  Allergen Reactions  . Shellfish Allergy Anaphylaxis    VITALS:  Blood pressure 119/65, pulse 84, temperature 98.2 F (36.8 C), temperature source Oral, resp. rate 20, height 5\' 8"  (1.727 m), weight 65.1 kg, SpO2 100 %.  PHYSICAL EXAMINATION:   Physical Exam  GENERAL:  67 y.o.-year-old patient lying in the bed with no acute distress.  EYES: Pupils equal, round, reactive to light and accommodation. No scleral icterus. Extraocular muscles intact.  HEENT: Head atraumatic, normocephalic. Oropharynx and nasopharynx clear.  NECK:  Supple, no jugular venous distention. No thyroid enlargement, no tenderness.  LUNGS: Normal  breath sounds bilaterally, no wheezing, rales, rhonchi. No use of accessory muscles of respiration.  CARDIOVASCULAR: S1, S2 normal. No murmurs, rubs, or gallops.  ABDOMEN: Soft, nontender, nondistended. Bowel sounds present. No organomegaly or mass.  EXTREMITIES: No cyanosis, clubbing or edema b/l.    right leg wound with discharge NEUROLOGIC: Cranial nerves II through XII are intact. No focal Motor or sensory deficits b/l.   PSYCHIATRIC: The patient is alert and oriented x 3.  SKIN: No obvious rash, lesion, or ulcer.   LABORATORY PANEL:   CBC Recent Labs  Lab 04/05/18 0530  WBC 12.4*  HGB 9.9*  HCT 32.6*  PLT 216   ------------------------------------------------------------------------------------------------------------------ Chemistries  Recent Labs  Lab 04/04/18 0534 04/05/18 0530  NA 140 138  K 5.4* 4.3  CL 100 100  CO2 24 26  GLUCOSE 82 68*  BUN 59* 24*  CREATININE 9.50* 5.44*  CALCIUM 10.0 9.1  AST 18  --   ALT 5  --   ALKPHOS 70  --   BILITOT 0.4  --    ------------------------------------------------------------------------------------------------------------------  Cardiac Enzymes Recent Labs  Lab 04/04/18 0830  TROPONINI 0.05*   ------------------------------------------------------------------------------------------------------------------  RADIOLOGY:  Ct Head Wo Contrast  Result Date: 04/04/2018 CLINICAL DATA:  Tremors and confusion EXAM: CT HEAD WITHOUT CONTRAST TECHNIQUE: Contiguous axial images were obtained from the base of the skull through the vertex without intravenous contrast. COMPARISON:  01/20/2018 MRI FINDINGS: Brain: Mild atrophic changes and chronic white matter ischemic changes are noted similar to that seen on prior exam and commensurate with the patient's given age. Vascular: No hyperdense vessel or unexpected calcification. Skull: Normal. Negative for fracture  or focal lesion. Sinuses/Orbits: No acute finding. Other: None.  IMPRESSION: Chronic atrophic and ischemic changes without acute intracranial abnormality Electronically Signed   By: Inez Catalina M.D.   On: 04/04/2018 07:26   Dg Chest Port 1 View  Result Date: 04/04/2018 CLINICAL DATA:  Tremors and confusion. EXAM: PORTABLE CHEST 1 VIEW COMPARISON:  Radiographs 02/28/2018 FINDINGS: Right-sided dialysis catheter in the mid SVC. Upper normal heart size with mild aortic tortuosity. Aortic atherosclerosis. No focal airspace disease, pulmonary edema, pleural effusion or pneumothorax. Vascular stents in the left axilla. IMPRESSION: No acute pulmonary process. Electronically Signed   By: Keith Rake M.D.   On: 04/04/2018 06:10     ASSESSMENT AND PLAN:  67 year old male patient with history of peripheral arterial disease, osteomyelitis of the left foot, recent angiogram and left common femoral artery and proximal SFA, hypertension, gout, chronic congestive heart failure, peripheral vascular disease currently under hospitalist service  -Altered mental status and tremors improved Probably secondary to gabapentin toxicity Decreased dose and resume gabapentin 300 mg nightly  -Hypothermia improved Off warming blanket  -Leukocytosis could be from the right leg wound infection Cultures did not reveal any growth so far MRSA PCR negative Discontinue vancomycin Continue IV cefepime antibiotic and follow-up WBC count Wound care consult  -Mild hyperkalemia improved after dialysis   -End-stage renal disease Continue dialysis as per nephrology   -DVT prophylaxis On oral Eliquis for anticoagulation  -Peripheral vascular disease Continue aspirin and statin  -Mildly elevated troponin probably secondary to demand ischemia  -Disposition Discharge patient in a.m. to home with home health services   All the records are reviewed and case discussed with Care Management/Social Worker. Management plans discussed with the patient, family and they are in  agreement.  CODE STATUS: Full code  DVT Prophylaxis: SCDs  TOTAL TIME TAKING CARE OF THIS PATIENT: 35 minutes.   POSSIBLE D/C IN 1 DAYS, DEPENDING ON CLINICAL CONDITION.  Saundra Shelling M.D on 04/05/2018 at 11:35 AM  Between 7am to 6pm - Pager - [phone]  After 6pm go to [email or web address] - password [email or web address] [facility]  Office  [phone]  CC: Primary care physician; [name], MD  Note: This dictation was prepared with Dragon dictation along with smaller phrase technology. Any transcriptional errors that result from this process are unintentional.

## 2018-04-06 DIAGNOSIS — I120 Hypertensive chronic kidney disease with stage 5 chronic kidney disease or end stage renal disease: Secondary | ICD-10-CM

## 2018-04-06 DIAGNOSIS — Z87891 Personal history of nicotine dependence: Secondary | ICD-10-CM

## 2018-04-06 DIAGNOSIS — L8990 Pressure ulcer of unspecified site, unspecified stage: Secondary | ICD-10-CM

## 2018-04-06 DIAGNOSIS — N186 End stage renal disease: Secondary | ICD-10-CM

## 2018-04-06 DIAGNOSIS — L97929 Non-pressure chronic ulcer of unspecified part of left lower leg with unspecified severity: Secondary | ICD-10-CM

## 2018-04-06 DIAGNOSIS — Z992 Dependence on renal dialysis: Secondary | ICD-10-CM

## 2018-04-06 LAB — CBC
HCT: 33.1 % — ABNORMAL LOW (ref 39.0–52.0)
HEMOGLOBIN: 10.1 g/dL — AB (ref 13.0–17.0)
MCH: 30.7 pg (ref 26.0–34.0)
MCHC: 30.5 g/dL (ref 30.0–36.0)
MCV: 100.6 fL — ABNORMAL HIGH (ref 80.0–100.0)
PLATELETS: 238 10*3/uL (ref 150–400)
RBC: 3.29 MIL/uL — AB (ref 4.22–5.81)
RDW: 16.1 % — ABNORMAL HIGH (ref 11.5–15.5)
WBC: 14 10*3/uL — AB (ref 4.0–10.5)
nRBC: 0 % (ref 0.0–0.2)

## 2018-04-06 MED ORDER — VANCOMYCIN HCL IN DEXTROSE 750-5 MG/150ML-% IV SOLN
750.0000 mg | INTRAVENOUS | Status: DC
  Administered 2018-04-06 – 2018-04-09 (×2): 750 mg via INTRAVENOUS
  Filled 2018-04-06 (×4): qty 150

## 2018-04-06 NOTE — Consult Note (Signed)
[place] nurse consulted for LE necrotic wounds. Patient has been followed by vascular recently and has bee followed at [facility] for the same. After review of the chart it appears patient is high risk of limb loss.   I will order conservative wound care at this time until it can be determined follow up plans for surgical intervention for the LE wounds.   Consider consultation with vascular while inpatient.   Saline moist gauze dressings to LE wounds, change daily. Secure with kelix or conform gauze.     Re consult if needed, will not follow at this time. Thanks  Arisbel Maione [employer], RN,CWOCN, CNS, [place] [phone])

## 2018-04-06 NOTE — Consult Note (Signed)
[facility] [place]  Patient name: Johnny [name]. MRN: [id number] DOB: 1950/10/27 Sex: male   REQUESTING PROVIDER:    Hospital service   REASON FOR CONSULT:    Leg wound  HISTORY OF PRESENT ILLNESS:   Johnny Marschall. is a 66 y.o. male, who is s/p left leg angiogram by Dr. Lucky Cowboy on 04/02/2018.  He underdent PTA of the left SFA/Pop and CFA for left leg ulcer.  He presented to the ED on 11-27 with lethargy and confusion.  He was hypothermic.  The patient has a history of ESRD on HD.  He is medically managed for hypertension.  He is on a statin for hypercholesterolemia.  He also has a history of CHF  PAST MEDICAL HISTORY    Past Medical History:  Diagnosis Date  . Anemia   . Anxiety   . CHF (congestive heart failure) ([place])   . Chronic kidney disease   . Gout   . Hyperlipidemia   . Hypertension   . Peripheral vascular disease ([place])      FAMILY HISTORY   Family History  Problem Relation Age of Onset  . Hypertension Unknown   . Heart disease Unknown   . Diabetes Mother     SOCIAL HISTORY:   Social History   Socioeconomic History  . Marital status: Single    Spouse name: Not on file  . Number of children: Not on file  . Years of education: Not on file  . Highest education level: Not on file  Occupational History  . Occupation: retired  Scientific laboratory technician  . Financial resource strain: Patient refused  . Food insecurity:    Worry: Patient refused    Inability: Patient refused  . Transportation needs:    Medical: Patient refused    Non-medical: Patient refused  Tobacco Use  . Smoking status: Former Smoker    Types: Cigarettes  . Smokeless tobacco: Never Used  Substance and Sexual Activity  . Alcohol use: No  . Drug use: No  . Sexual activity: Not on file  Lifestyle  . Physical activity:    Days per week: Patient refused    Minutes per session: Patient refused  . Stress: Not on file  Relationships    . Social connections:    Talks on phone: Patient refused    Gets together: Patient refused    Attends religious service: Patient refused    Active member of club or organization: Patient refused    Attends meetings of clubs or organizations: Patient refused    Relationship status: Patient refused  . Intimate partner violence:    Fear of current or ex partner: Patient refused    Emotionally abused: Patient refused    Physically abused: Patient refused    Forced sexual activity: Patient refused  Other Topics Concern  . Not on file  Social History Narrative  . Not on file    ALLERGIES:    Allergies  Allergen Reactions  . Shellfish Allergy Anaphylaxis    CURRENT MEDICATIONS:    Current Facility-Administered Medications  Medication Dose Route Frequency Provider Last Rate Last Dose  . 0.9 %  sodium chloride infusion  250 mL Intravenous PRN Saundra Shelling, MD   Stopped at 04/05/18 1628  . acetaminophen (TYLENOL) tablet 650 mg  650 mg Oral Q6H PRN Saundra Shelling, MD   650 mg at 04/06/18 2135   Or  . acetaminophen (TYLENOL) suppository 650 mg  650 mg Rectal Q6H PRN Saundra Shelling, MD      .  allopurinol (ZYLOPRIM) tablet 100 mg  100 mg Oral Daily Pyreddy, Reatha Harps, MD   100 mg at 04/06/18 1708  . amLODipine (NORVASC) tablet 10 mg  10 mg Oral QHS Saundra Shelling, MD   10 mg at 04/06/18 2124  . apixaban (ELIQUIS) tablet 2.5 mg  2.5 mg Oral BID Saundra Shelling, MD   2.5 mg at 04/06/18 2124  . aspirin EC tablet 81 mg  81 mg Oral Daily Pyreddy, Reatha Harps, MD   81 mg at 04/06/18 1709  . atorvastatin (LIPITOR) tablet 10 mg  10 mg Oral Daily Pyreddy, Reatha Harps, MD   10 mg at 04/06/18 1708  . calcium acetate (PHOSLO) capsule 1,334 mg  1,334 mg Oral TID WC Saundra Shelling, MD   1,334 mg at 04/06/18 1709  . ceFEPIme (MAXIPIME) 1 g in sodium chloride 0.9 % 100 mL IVPB  1 g Intravenous Q24H Oswald Hillock, RPH 200 mL/hr at 04/06/18 1721 1 g at 04/06/18 1721  . Chlorhexidine Gluconate Cloth 2 % PADS 6 each  6  each Topical Q0600 Murlean Iba, MD   6 each at 04/06/18 0503  . clonazePAM (KLONOPIN) tablet 0.5 mg  0.5 mg Oral BID PRN Saundra Shelling, MD   0.5 mg at 04/04/18 2206  . clopidogrel (PLAVIX) tablet 75 mg  75 mg Oral Daily Pyreddy, Reatha Harps, MD   75 mg at 04/06/18 1708  . colchicine tablet 0.6 mg  0.6 mg Oral Daily PRN Saundra Shelling, MD   0.6 mg at 04/05/18 1218  . epoetin alfa (EPOGEN,PROCRIT) injection 4,000 Units  4,000 Units Intravenous Q M,W,F-HD Murlean Iba, MD   4,000 Units at 04/06/18 1410  . furosemide (LASIX) tablet 40 mg  40 mg Oral Daily Pyreddy, Reatha Harps, MD   40 mg at 04/06/18 1708  . gabapentin (NEURONTIN) capsule 300 mg  300 mg Oral QHS Pyreddy, Reatha Harps, MD   300 mg at 04/06/18 2124  . hydrALAZINE (APRESOLINE) injection 10 mg  10 mg Intravenous Once Paulette Blanch, MD      . HYDROcodone-acetaminophen (NORCO/VICODIN) 5-325 MG per tablet 1 tablet  1 tablet Oral Q4H PRN Saundra Shelling, MD   1 tablet at 04/06/18 1708  . Melatonin TABS 2.5 mg  2.5 mg Oral QPM Pyreddy, Reatha Harps, MD   2.5 mg at 04/06/18 2124  . metoprolol succinate (TOPROL-XL) 24 hr tablet 25 mg  25 mg Oral QHS Saundra Shelling, MD   25 mg at 04/06/18 2124  . metroNIDAZOLE (FLAGYL) IVPB 500 mg  500 mg Intravenous Q8H Merlyn Lot, MD 100 mL/hr at 04/06/18 1715 500 mg at 04/06/18 1715  . multivitamin (RENA-VIT) tablet 1 tablet  1 tablet Oral QHS Saundra Shelling, MD   1 tablet at 04/06/18 2124  . ondansetron (ZOFRAN) tablet 4 mg  4 mg Oral Q6H PRN Saundra Shelling, MD   4 mg at 04/06/18 1159   Or  . ondansetron (ZOFRAN) injection 4 mg  4 mg Intravenous Q6H PRN Pyreddy, Reatha Harps, MD      . senna-docusate (Senokot-S) tablet 1 tablet  1 tablet Oral QHS PRN Pyreddy, Reatha Harps, MD      . sodium chloride flush (NS) 0.9 % injection 3 mL  3 mL Intravenous Q12H Saundra Shelling, MD   3 mL at 04/06/18 2125  . vancomycin (VANCOCIN) IVPB 750 mg/150 ml premix  750 mg Intravenous Q M,W,F-HD Saundra Shelling, MD   Stopped at 04/06/18 1350    REVIEW OF  SYSTEMS:   [X]  denotes positive finding, [ ]  denotes negative finding Cardiac  Comments:  Chest pain or chest pressure:    Shortness of breath upon exertion:    Short of breath when lying flat:    Irregular heart rhythm:        Vascular    Pain in calf, thigh, or hip brought on by ambulation:    Pain in feet at night that wakes you up from your sleep:     Blood clot in your veins:    Leg swelling:  x       Pulmonary    Oxygen at home:    Productive cough:     Wheezing:         Neurologic    Sudden weakness in arms or legs:     Sudden numbness in arms or legs:     Sudden onset of difficulty speaking or slurred speech:    Temporary loss of vision in one eye:     Problems with dizziness:         Gastrointestinal    Blood in stool:      Vomited blood:         Genitourinary    Burning when urinating:     Blood in urine:        Psychiatric    Major depression:         Hematologic    Bleeding problems:    Problems with blood clotting too easily:        Skin    Rashes or ulcers: x       Constitutional    Fever or chills:     PHYSICAL EXAM:   Vitals:   04/06/18 1420 04/06/18 1425 04/06/18 1443 04/06/18 2004  BP: 140/76 (!) 145/77 (!) 128/51 (!) 122/59  Pulse: 80  92 83  Resp: 15 17 18 16   Temp:   99 F (37.2 C) 98.6 F (37 C)  TempSrc:   Oral Oral  SpO2:   93% 100%  Weight:      Height:        GENERAL: The patient is a well-nourished male, in no acute distress. The vital signs are documented above. CARDIAC: There is a regular rate and rhythm.  VASCULAR: non-palpable left pedla pulse PULMONARY: Nonlabored respirations ABDOMEN: Soft and non-tender with normal pitched bowel sounds.  MUSCULOSKELETAL: There are no major deformities or cyanosis. NEUROLOGIC: No focal weakness or paresthesias are detected. SKIN: left leg ulcer PSYCHIATRIC: The patient has a normal affect.  STUDIES:   none  ASSESSMENT and PLAN   The patient was seen in HD.  I did not  completely remove his wound dressing to the left leg.  He appears to have an ischemic ulcer without erythema.  I will be able to get a better evaluation of his wound tomorrow at the bedside and also check his vascular status.  He knows he is at risk for amputation.  Further recommendations will be made after a better exam when he is not in HD   Annamarie Major, MD [facility] [phone] Pager [phone]

## 2018-04-06 NOTE — Progress Notes (Signed)
HD Treatment Completed    04/06/18 1410  Vital Signs  Pulse Rate Source Monitor  Resp 14  BP (!) 147/82  BP Location Left Arm  BP Method Automatic  Patient Position (if appropriate) Lying  Oxygen Therapy  SpO2 100 %  O2 Device Room Air  During Hemodialysis Assessment  Blood Flow Rate (mL/min) 400 mL/min  Arterial Pressure (mmHg) -200 mmHg  Venous Pressure (mmHg) 170 mmHg  Transmembrane Pressure (mmHg) 50 mmHg  Ultrafiltration Rate (mL/min) 690 mL/min  Dialysate Flow Rate (mL/min) 800 ml/min  Conductivity: Machine  14  HD Safety Checks Performed Yes  Intra-Hemodialysis Comments Tolerated well;Tx completed

## 2018-04-06 NOTE — Progress Notes (Signed)
Post HD Treatment  Patient more alert now, but still has intermittent confusion. Report called to primary RN. Patient's Net UF was 1516 and his BVP was 80.5. Vancomycin completed. All blood was returned to patient.     04/06/18 1415  Hand-Off documentation  Report given to (Full Name) Lawson Fiscal, RN  Report received from (Full Name) Stephannie Peters, RN  Vital Signs  Temp 98.4 F (36.9 C)  Temp Source Oral  Pulse Rate 70  Pulse Rate Source Monitor  Resp 13  BP 134/75  BP Location Left Arm  BP Method Automatic  Patient Position (if appropriate) Lying  Oxygen Therapy  SpO2 100 %  O2 Device Room Air  Pain Assessment  Pain Scale 0-10  Pain Score 0  Dialysis Weight  Weight 64 kg  Type of Weight Post-Dialysis  Post-Hemodialysis Assessment  Rinseback Volume (mL) 250 mL  KECN 270 V  Dialyzer Clearance Lightly streaked  Duration of HD Treatment -hour(s) 3.5 hour(s)  Hemodialysis Intake (mL) 600 mL  UF Total -Machine (mL) 2116 mL  Net UF (mL) 1516 mL  Tolerated HD Treatment Yes  Post-Hemodialysis Comments Pt tolerated   Fistula / Graft Left Arteriovenous fistula  No Placement Date or Time found.   Placed prior to admission: Yes  Orientation: Left  Access Type: Arteriovenous fistula  Site Condition Other (Comment)  Fistula / Graft Assessment Absent ;Thrill;Bruit  Drainage Description None  Hemodialysis Catheter Right Subclavian Double-lumen  Placement Date/Time: (c) (c)   Placed prior to admission: No  Orientation: Right  Access Location: Subclavian  Hemodialysis Catheter Type: Double-lumen  Site Condition No complications  Blue Lumen Status Capped (Central line)  Red Lumen Status Capped (Central line)  Purple Lumen Status N/A  Catheter fill solution Heparin 1000 units/ml  Catheter fill volume (Arterial) 1.8 cc  Catheter fill volume (Venous) 1.9  Dressing Type Biopatch;Occlusive  Dressing Status Clean;Dry;Intact  Drainage Description None  Post treatment catheter status  Capped and Clamped

## 2018-04-06 NOTE — Care Management Important Message (Signed)
Copy of signed Medicare IM left in patient's room (out for procedure).  

## 2018-04-06 NOTE — Progress Notes (Signed)
HD Treatment Initiated    04/06/18 1021  Vital Signs  Pulse Rate 81  Resp 13  Oxygen Therapy  SpO2 100 %  During Hemodialysis Assessment  Blood Flow Rate (mL/min) 400 mL/min  Arterial Pressure (mmHg) -190 mmHg  Venous Pressure (mmHg) 160 mmHg  Transmembrane Pressure (mmHg) 40 mmHg  Ultrafiltration Rate (mL/min) 570 mL/min  Dialysate Flow Rate (mL/min) 800 ml/min  Conductivity: Machine  14  HD Safety Checks Performed Yes  Intra-Hemodialysis Comments Tx initiated;Progressing as prescribed  Hemodialysis Catheter Right Subclavian Double-lumen  Placement Date/Time: (c) (c)   Placed prior to admission: No  Orientation: Right  Access Location: Subclavian  Hemodialysis Catheter Type: Double-lumen  Blue Lumen Status Infusing  Red Lumen Status Infusing  Purple Lumen Status N/A

## 2018-04-06 NOTE — Progress Notes (Signed)
Pharmacy Antibiotic Note  Johnny [name]. is a 67 y.o. male admitted on 04/04/2018 with sepsis.  Pharmacy has been consulted for cefepime and vancomycin dosing. Afeb, WBC 11.7, lactic 1.28.  HD MWF. Has known osteomyelitis. Risk for bacteremia.   Vancomycin d/c 04/05/18,  Then restarted 11/29  Plan:  Day 3-  cefepime 1 g IV q24H -Hemodialysis dosing.   Day 3- Vancomcyin 750 mg Qhemodialysis- M,W,F. Started 04/04/18. Last dose given 11/27 after HD. TRough ordered prior to 3rd HD on 12/2.   Height: 5\' 8"  (172.7 cm) Weight: 143 lb 8.3 oz (65.1 kg) IBW/kg (Calculated) : 68.4  Temp (24hrs), Avg:98.4 F (36.9 C), Min:98 F (36.7 C), Max:98.6 F (37 C)  Recent Labs  Lab 04/04/18 0534 04/04/18 0557 04/05/18 0530 04/06/18 0342  WBC 11.7*  --  12.4* 14.0*  CREATININE 9.50*  --  5.44*  --   LATICACIDVEN  --  1.28  --   --     Estimated Creatinine Clearance: 11.8 mL/min (A) (by C-G formula based on SCr of 5.44 mg/dL (H)).    Allergies  Allergen Reactions  . Shellfish Allergy Anaphylaxis    Antimicrobials this admission: 11/27 cefepime >>  11/27 vancomycin >>  11/27 metronidazole >>   Dose adjustments this admission: On HD MWF  Microbiology results: 11/27 BCx: pending MRSA PCR neg.  Thank you for allowing pharmacy to be a part of this patient's care.  Noralee Space, PharmD  Clinical pharmacist 04/06/2018 9:42 AM

## 2018-04-06 NOTE — Progress Notes (Signed)
Pharmacy Antibiotic Note  Johnny [name]. is a 67 y.o. male admitted on 04/04/2018 with sepsis.  Pharmacy has been consulted for cefepime and vancomycin dosing. Afeb, WBC 11.7, lactic 1.28.  HD MWF. Has known osteomyelitis. Risk for bacteremia.   Vancomycin d/c 04/05/18  Plan:  Day 3-  cefepime 1 g IV q24H -Hemodialysis dosing.    Height: 5\' 8"  (172.7 cm) Weight: 143 lb 8.3 oz (65.1 kg) IBW/kg (Calculated) : 68.4  Temp (24hrs), Avg:98.4 F (36.9 C), Min:98 F (36.7 C), Max:98.6 F (37 C)  Recent Labs  Lab 04/04/18 0534 04/04/18 0557 04/05/18 0530 04/06/18 0342  WBC 11.7*  --  12.4* 14.0*  CREATININE 9.50*  --  5.44*  --   LATICACIDVEN  --  1.28  --   --     Estimated Creatinine Clearance: 11.8 mL/min (A) (by C-G formula based on SCr of 5.44 mg/dL (H)).    Allergies  Allergen Reactions  . Shellfish Allergy Anaphylaxis    Antimicrobials this admission: 11/27 cefepime >>  11/27 vancomycin >> 11/28 11/27 metronidazole >>   Dose adjustments this admission: On HD MWF  Microbiology results: 11/27 BCx: pending MRSA PCR neg.  Thank you for allowing pharmacy to be a part of this patient's care.  Noralee Space, PharmD  Clinical pharmacist 04/06/2018 8:30 AM

## 2018-04-06 NOTE — Progress Notes (Signed)
Post HD Assessment    04/06/18 1420  Neurological  Level of Consciousness Alert  Orientation Level Oriented to person;Oriented to situation;Disoriented to place;Disoriented to time  Respiratory  Respiratory Pattern Regular;Unlabored;Symmetrical (pt is apneic when sleeping)  Chest Assessment Chest expansion symmetrical  Bilateral Breath Sounds Clear  Cardiac  Pulse Regular  Heart Sounds S1, S2  Jugular Venous Distention (JVD) No  ECG Monitor Yes  Cardiac Rhythm NSR;BBB  Vascular  R Radial Pulse +2  L Radial Pulse +2  R Dorsalis Pedis Pulse +1  L Dorsalis Pedis Pulse +1  Integumentary  Integumentary (WDL) X  Skin Color Appropriate for ethnicity  Skin Condition Dry;Flaky  Skin Integrity Abrasion  Musculoskeletal  Musculoskeletal (WDL) X  Generalized Weakness Yes  GU Assessment  Genitourinary (WDL) X (HD pt)  Psychosocial  Psychosocial (WDL) WDL

## 2018-04-06 NOTE — Progress Notes (Signed)
Pre HD Treatment    04/06/18 1015  Vital Signs  Temp 98.1 F (36.7 C)  Temp Source Oral  Pulse Rate 81  Pulse Rate Source Monitor  Resp 19  BP 140/75  BP Location Right Arm  BP Method Automatic  Patient Position (if appropriate) Lying  Oxygen Therapy  SpO2 100 %  O2 Device Room Air  Pain Assessment  Pain Scale 0-10  Pain Score 0  Dialysis Weight  Weight 65.4 kg  Type of Weight Pre-Dialysis  Time-Out for Hemodialysis  What Procedure? HD  Pt Identifiers(min of two) First/Last Name;MRN/Account#;Pt's DOB(use if MRN/Acct# not available  Correct Site? Yes  Correct Side? Yes  Correct Procedure? Yes  Consents Verified? Yes  Rad Studies Available? N/A  Safety Precautions Reviewed? Yes  Engineer, civil (consulting) Number 5  Station Number 3  UF/Alarm Test Passed  Conductivity: Meter 14  Conductivity: Machine  13.8  pH 7.4  Reverse Osmosis Main  Normal Saline Lot Number [id number]  Dialyzer Lot Number [id number]  Machine Temperature 98.6 F (37 C)  Musician and Audible Yes  Blood Lines Intact and Secured Yes  Pre Treatment Patient Checks  Vascular access used during treatment Catheter  Hepatitis B Surface Antigen Results Negative  Date Hepatitis B Surface Antigen Drawn 01/29/18  Isolation Initiated Yes  Hepatitis B Surface Antibody  (>10)  Date Hepatitis B Surface Antibody Drawn 01/29/18  Hemodialysis Consent Verified Yes  Hemodialysis Standing Orders Initiated Yes  ECG (Telemetry) Monitor On Yes  Prime Ordered Normal Saline  Length of  DialysisTreatment -hour(s) 3.5 Hour(s)  Dialysis Treatment Comments Na 140  Dialyzer Elisio 17H NR  Dialysate 2K, 2.5 Ca  Variable Sodium Other (Comment)  Dialysis Anticoagulant None  Dialysate Flow Ordered 800  Blood Flow Rate Ordered 400 mL/min  Ultrafiltration Goal 1.5 Liters  Pre Treatment Labs Renal panel;CBC  Dialysis Blood Pressure Support Ordered Normal Saline  Education / Care Plan  Dialysis Education Provided Yes   Documented Education in Care Plan Yes  Fistula / Graft Left Arteriovenous fistula  No Placement Date or Time found.   Placed prior to admission: Yes  Orientation: Left  Access Type: Arteriovenous fistula  Site Condition Other (Comment)  Fistula / Graft Assessment Absent ;Thrill;Bruit  Drainage Description None  Hemodialysis Catheter Right Subclavian Double-lumen  Placement Date/Time: (c) (c)   Placed prior to admission: No  Orientation: Right  Access Location: Subclavian  Hemodialysis Catheter Type: Double-lumen  Site Condition No complications  Blue Lumen Status Capped (Central line)  Red Lumen Status Capped (Central line)  Dressing Type Biopatch;Occlusive  Dressing Status Clean;Dry;Intact

## 2018-04-06 NOTE — Progress Notes (Signed)
Pre HD Assessment    04/06/18 1014  Neurological  Level of Consciousness Alert  Orientation Level Oriented to person;Oriented to situation;Disoriented to place;Disoriented to time  Respiratory  Respiratory Pattern Regular;Unlabored;Symmetrical  Chest Assessment Chest expansion symmetrical  Bilateral Breath Sounds Clear  Cardiac  Pulse Regular  Heart Sounds S1, S2  Jugular Venous Distention (JVD) No  ECG Monitor Yes  Cardiac Rhythm NSR;BBB  Vascular  R Radial Pulse +2  L Radial Pulse +2  R Dorsalis Pedis Pulse +1  L Dorsalis Pedis Pulse +1  Integumentary  Integumentary (WDL) X  Skin Color Appropriate for ethnicity  Skin Condition Dry;Flaky  Skin Integrity Abrasion  Musculoskeletal  Musculoskeletal (WDL) X  Generalized Weakness Yes  GU Assessment  Genitourinary (WDL) X (HD pt)  Psychosocial  Psychosocial (WDL) WDL

## 2018-04-06 NOTE — Progress Notes (Deleted)
Post HD Assessment    04/06/18 1415  Hand-Off documentation  Report given to (Full Name) Lawson Fiscal, RN  Report received from (Full Name) Stephannie Peters, RN  Vital Signs  Temp 98.4 F (36.9 C)  Temp Source Oral  Pulse Rate 70  Pulse Rate Source Monitor  Resp 13  BP 134/75  BP Location Left Arm  BP Method Automatic  Patient Position (if appropriate) Lying  Oxygen Therapy  SpO2 100 %  O2 Device Room Air  Pain Assessment  Pain Scale 0-10  Pain Score 0  Dialysis Weight  Weight 64 kg  Type of Weight Post-Dialysis  Post-Hemodialysis Assessment  Rinseback Volume (mL) 250 mL  KECN 270 V  Dialyzer Clearance Lightly streaked  Duration of HD Treatment -hour(s) 3.5 hour(s)  Hemodialysis Intake (mL) 600 mL  UF Total -Machine (mL) 2116 mL  Net UF (mL) 1516 mL  Tolerated HD Treatment Yes  Post-Hemodialysis Comments Pt tolerated   Fistula / Graft Left Arteriovenous fistula  No Placement Date or Time found.   Placed prior to admission: Yes  Orientation: Left  Access Type: Arteriovenous fistula  Site Condition Other (Comment)  Fistula / Graft Assessment Absent ;Thrill;Bruit  Drainage Description None  Hemodialysis Catheter Right Subclavian Double-lumen  Placement Date/Time: (c) (c)   Placed prior to admission: No  Orientation: Right  Access Location: Subclavian  Hemodialysis Catheter Type: Double-lumen  Site Condition No complications  Blue Lumen Status Capped (Central line)  Red Lumen Status Capped (Central line)  Purple Lumen Status N/A  Catheter fill solution Heparin 1000 units/ml  Catheter fill volume (Arterial) 1.8 cc  Catheter fill volume (Venous) 1.9  Dressing Type Biopatch;Occlusive  Dressing Status Clean;Dry;Intact  Drainage Description None  Post treatment catheter status Capped and Clamped

## 2018-04-06 NOTE — Progress Notes (Signed)
[place], [place] 04/06/18  Subjective:   Seen during dialysis.  Tolerating well Denies any acute complaints although somewhat lethargic today   HEMODIALYSIS FLOWSHEET:  Blood Flow Rate (mL/min): 400 mL/min Arterial Pressure (mmHg): -200 mmHg Venous Pressure (mmHg): 170 mmHg Transmembrane Pressure (mmHg): 40 mmHg Ultrafiltration Rate (mL/min): 570 mL/min Dialysate Flow Rate (mL/min): 800 ml/min Conductivity: Machine : 13.6 Conductivity: Machine : 13.6 Dialysis Fluid Bolus: Normal Saline Bolus Amount (mL): 50 mL    Objective:  Vital signs in last 24 hours:  Temp:  [98 F (36.7 C)-98.6 F (37 C)] 98.1 F (36.7 C) (11/29 1015) Pulse Rate:  [72-82] 80 (11/29 1100) Resp:  [13-19] 16 (11/29 1100) BP: (102-171)/(66-96) 171/96 (11/29 1100) SpO2:  [99 %-100 %] 99 % (11/29 1100) Weight:  [65.4 kg] 65.4 kg (11/29 1015)  Weight change:  Filed Weights   04/04/18 1300 04/04/18 1705 04/06/18 1015  Weight: 66.9 kg 65.1 kg 65.4 kg    Intake/Output:    Intake/Output Summary (Last 24 hours) at 04/06/2018 1110 Last data filed at 04/06/2018 0949 Gross per 24 hour  Intake 735.96 ml  Output 0 ml  Net 735.96 ml     Physical Exam: General:  No acute distress, laying in the bed  HEENT  moist oral mucous membranes  Neck  supple  Pulm/lungs  normal breathing effort, clear  CVS/Heart  irregular rhythm  Abdomen:   Soft, nontender  Extremities:  No edema,  Rt leg bandage  Neurologic:  Lethargic, but able to answer questions  Skin:  Skin  Access:  Right IJ PermCath       Basic Metabolic Panel:  Recent Labs  Lab 04/04/18 0534 04/05/18 0530  NA 140 138  K 5.4* 4.3  CL 100 100  CO2 24 26  GLUCOSE 82 68*  BUN 59* 24*  CREATININE 9.50* 5.44*  CALCIUM 10.0 9.1     CBC: Recent Labs  Lab 04/04/18 0534 04/05/18 0530 04/06/18 0342  WBC 11.7* 12.4* 14.0*  NEUTROABS 8.0*  --   --   HGB 10.1* 9.9* 10.1*  HCT 32.8* 32.6* 33.1*  MCV 101.2*  100.3* 100.6*  PLT 300 216 238      Lab Results  Component Value Date   HEPBSAG Negative 09/30/2015   HEPBSAB Reactive 01/29/2018      Microbiology:  Recent Results (from the past 240 hour(s))  Culture, blood (routine x 2)     Status: None (Preliminary result)   Collection Time: 04/04/18  5:34 AM  Result Value Ref Range Status   Specimen Description BLOOD BLOOD RIGHT HAND  Final   Special Requests   Final    BOTTLES DRAWN AEROBIC AND ANAEROBIC Blood Culture adequate volume   Culture   Final    NO GROWTH 2 DAYS Performed at [facility], [address] [place], [place] [address]    Report Status PENDING  Incomplete  Culture, blood (routine x 2)     Status: None (Preliminary result)   Collection Time: 04/04/18  5:35 AM  Result Value Ref Range Status   Specimen Description BLOOD BLOOD LEFT HAND  Final   Special Requests   Final    BOTTLES DRAWN AEROBIC AND ANAEROBIC Blood Culture adequate volume   Culture   Final    NO GROWTH 2 DAYS Performed at [facility], [address], [place], [place] [address]    Report Status PENDING  Incomplete  MRSA PCR Screening     Status: None  Collection Time: 04/04/18  6:22 PM  Result Value Ref Range Status   MRSA by PCR NEGATIVE NEGATIVE Final    Comment:        The GeneXpert MRSA Assay (FDA approved for NASAL specimens only), is one component of a comprehensive MRSA colonization surveillance program. It is not intended to diagnose MRSA infection nor to guide or monitor treatment for MRSA infections. Performed at [facility], [address] [place], [place] [address]     Coagulation Studies: No results for input(s): LABPROT, INR in the last 72 hours.  Urinalysis: No results for input(s): COLORURINE, LABSPEC, PHURINE, GLUCOSEU, HGBUR, BILIRUBINUR, KETONESUR, PROTEINUR, UROBILINOGEN, NITRITE, LEUKOCYTESUR in the last 72 hours.  Invalid input(s): APPERANCEUR    Imaging: No results  found.   Medications:   . sodium chloride Stopped (04/05/18 1628)  . ceFEPime (MAXIPIME) IV Stopped (04/05/18 1041)  . metronidazole Stopped (04/06/18 0029)  . vancomycin     . allopurinol  100 mg Oral Daily  . amLODipine  10 mg Oral QHS  . apixaban  2.5 mg Oral BID  . aspirin EC  81 mg Oral Daily  . atorvastatin  10 mg Oral Daily  . calcium acetate  1,334 mg Oral TID WC  . Chlorhexidine Gluconate Cloth  6 each Topical Q0600  . clopidogrel  75 mg Oral Daily  . epoetin (EPOGEN/PROCRIT) injection  4,000 Units Intravenous Q M,W,F-HD  . furosemide  40 mg Oral Daily  . gabapentin  300 mg Oral QHS  . hydrALAZINE  10 mg Intravenous Once  . Melatonin  2.5 mg Oral QPM  . metoprolol succinate  25 mg Oral QHS  . multivitamin  1 tablet Oral QHS  . sodium chloride flush  3 mL Intravenous Q12H     Assessment/ Plan:  67 y.o. African-American male with end stage renal disease on hemodialysis, congestive heart failure, hypertension, peripheral vascular disease  [place]. MWF L AVF 61kg  1.  End-stage renal disease with hyperkalemia 2.  Anemia of chronic kidney disease- EPO with HD 3.  Secondary hyperparathyroidism- Continue home dose of binders with meals 4.  Altered mental status 5.  Left foot wound  Mental status improved after reducing the dose of gabapentin.  Now adjusted to 300 mg at night.  Currently undergoing dialysis.  Tolerating well.  Currently getting antibiotics for concern of left foot infection/osteomyelitis.    LOS: [place] 11/29/201911:10 AM  [place], [place]  Note: This note was prepared with Dragon dictation. Any transcription errors are unintentional

## 2018-04-06 NOTE — Progress Notes (Addendum)
[place] at [place] NAME: Johnny [name]    MR#:  [id number]  DATE OF BIRTH:  Apr 23, 1951  SUBJECTIVE:  CHIEF COMPLAINT:   Chief Complaint  Patient presents with  . Tremors  . Altered Mental Status  Been seen and evaluated today Decreased tremors in the extremities Mental status back to baseline No confusion No complaints of any chest pain Has foul smelling odor from left leg wound  REVIEW OF SYSTEMS:    ROS  CONSTITUTIONAL: No documented fever.  Decreased fatigue, weakness. No weight gain, no weight loss.  EYES: No blurry or double vision.  ENT: No tinnitus. No postnasal drip. No redness of the oropharynx.  RESPIRATORY: No cough, no wheeze, no hemoptysis. No dyspnea.  CARDIOVASCULAR: No chest pain. No orthopnea. No palpitations. No syncope.  GASTROINTESTINAL: No nausea, no vomiting or diarrhea. No abdominal pain. No melena or hematochezia.  GENITOURINARY: No dysuria or hematuria.  ENDOCRINE: No polyuria or nocturia. No heat or cold intolerance.  HEMATOLOGY: No anemia. No bruising. No bleeding.  INTEGUMENTARY: No rashes. No lesions.  MUSCULOSKELETAL: right leg wound (POA) NEUROLOGIC: No numbness, tingling, or ataxia. No seizure-type activity.  PSYCHIATRIC: No anxiety. No insomnia. No ADD.   DRUG ALLERGIES:   Allergies  Allergen Reactions  . Shellfish Allergy Anaphylaxis    VITALS:  Blood pressure (!) 143/82, pulse 80, temperature 98.6 F (37 C), temperature source Oral, resp. rate 16, height 5\' 8"  (1.727 m), weight 65.1 kg, SpO2 100 %.  PHYSICAL EXAMINATION:   Physical Exam  GENERAL:  67 y.o.-year-old patient lying in the bed with no acute distress.  EYES: Pupils equal, round, reactive to light and accommodation. No scleral icterus. Extraocular muscles intact.  HEENT: Head atraumatic, normocephalic. Oropharynx and nasopharynx clear.  NECK:  Supple, no jugular venous distention. No thyroid enlargement, no tenderness.   LUNGS: Normal breath sounds bilaterally, no wheezing, rales, rhonchi. No use of accessory muscles of respiration.  CARDIOVASCULAR: S1, S2 normal. No murmurs, rubs, or gallops.  ABDOMEN: Soft, nontender, nondistended. Bowel sounds present. No organomegaly or mass.  EXTREMITIES: No cyanosis, clubbing or edema b/l.    right leg bandage noted NEUROLOGIC: Cranial nerves II through XII are intact. No focal Motor or sensory deficits b/l.   PSYCHIATRIC: The patient is alert and oriented x 3.  SKIN: No obvious rash, lesion, or ulcer.   LABORATORY PANEL:   CBC Recent Labs  Lab 04/06/18 0342  WBC 14.0*  HGB 10.1*  HCT 33.1*  PLT 238   ------------------------------------------------------------------------------------------------------------------ Chemistries  Recent Labs  Lab 04/04/18 0534 04/05/18 0530  NA 140 138  K 5.4* 4.3  CL 100 100  CO2 24 26  GLUCOSE 82 68*  BUN 59* 24*  CREATININE 9.50* 5.44*  CALCIUM 10.0 9.1  AST 18  --   ALT 5  --   ALKPHOS 70  --   BILITOT 0.4  --    ------------------------------------------------------------------------------------------------------------------  Cardiac Enzymes Recent Labs  Lab 04/04/18 0830  TROPONINI 0.05*   ------------------------------------------------------------------------------------------------------------------  RADIOLOGY:  No results found.   ASSESSMENT AND PLAN:  67 year old male patient with history of peripheral arterial disease, osteomyelitis of the left foot, recent angiogram and left common femoral artery and proximal SFA, hypertension, gout, chronic congestive heart failure, peripheral vascular disease currently under hospitalist service  -Altered mental status and tremors improved Probably secondary to gabapentin toxicity Decreased dose and resume gabapentin 300 mg nightly and patient better  -Hypothermia improved Off warming blanket  -Leukocytosis worsening  probably from leg wound Systemic  inflammatory response syndrome Add iv vancomcyn Continue IV cefepime antibiotic and follow-up WBC count  -Right leg wound nonhealing Wound care consultation Vascular surgery evaluation for debridement and further management of the wound  -Mild hyperkalemia improved after dialysis   -End-stage renal disease Continue dialysis as per nephrology   -DVT prophylaxis On oral Eliquis for anticoagulation  -Peripheral vascular disease Continue aspirin and statin  -Mildly elevated troponin probably secondary to demand ischemia  -Disposition Will be decided once the left lower extremity wound has been taking care of   All the records are reviewed and case discussed with Care Management/Social Worker. Management plans discussed with the patient, family and they are in agreement.  CODE STATUS: Full code  DVT Prophylaxis: SCDs  TOTAL TIME TAKING CARE OF THIS PATIENT: 35 minutes.   POSSIBLE D/C IN 1 DAYS, DEPENDING ON CLINICAL CONDITION.  Saundra Shelling M.D on 04/06/2018 at 10:44 AM  Between 7am to 6pm - Pager - [phone]  After 6pm go to [email or web address] - password [email or web address] [facility]  Office  [phone]  CC: Primary care physician; [name], MD  Note: This dictation was prepared with Dragon dictation along with smaller phrase technology. Any transcriptional errors that result from this process are unintentional.

## 2018-04-07 LAB — CBC
HEMATOCRIT: 33.6 % — AB (ref 39.0–52.0)
Hemoglobin: 10.2 g/dL — ABNORMAL LOW (ref 13.0–17.0)
MCH: 30.5 pg (ref 26.0–34.0)
MCHC: 30.4 g/dL (ref 30.0–36.0)
MCV: 100.6 fL — ABNORMAL HIGH (ref 80.0–100.0)
Platelets: 247 10*3/uL (ref 150–400)
RBC: 3.34 MIL/uL — ABNORMAL LOW (ref 4.22–5.81)
RDW: 15.9 % — AB (ref 11.5–15.5)
WBC: 13.3 10*3/uL — AB (ref 4.0–10.5)
nRBC: 0 % (ref 0.0–0.2)

## 2018-04-07 LAB — BASIC METABOLIC PANEL
Anion gap: 10 (ref 5–15)
BUN: 15 mg/dL (ref 8–23)
CALCIUM: 8.9 mg/dL (ref 8.9–10.3)
CO2: 30 mmol/L (ref 22–32)
CREATININE: 4.36 mg/dL — AB (ref 0.61–1.24)
Chloride: 97 mmol/L — ABNORMAL LOW (ref 98–111)
GFR calc non Af Amer: 13 mL/min — ABNORMAL LOW (ref >60)
GFR, EST AFRICAN AMERICAN: 15 mL/min — AB (ref >60)
GLUCOSE: 82 mg/dL (ref 70–99)
Potassium: 3.6 mmol/L (ref 3.5–5.1)
Sodium: 137 mmol/L (ref 135–145)

## 2018-04-07 NOTE — Progress Notes (Addendum)
[place] at [place] NAME: Johnny [name]    MR#:  [id number]  DATE OF BIRTH:  [birth year]  SUBJECTIVE:  CHIEF COMPLAINT:   Chief Complaint  Patient presents with  . Tremors  . Altered Mental Status  Been seen and evaluated today No tremors in the upper extremities Mental status back to baseline No confusion No complaints of any chest pain No fever  REVIEW OF SYSTEMS:    ROS  CONSTITUTIONAL: No documented fever.  Decreased fatigue, weakness. No weight gain, no weight loss.  EYES: No blurry or double vision.  ENT: No tinnitus. No postnasal drip. No redness of the oropharynx.  RESPIRATORY: No cough, no wheeze, no hemoptysis. No dyspnea.  CARDIOVASCULAR: No chest pain. No orthopnea. No palpitations. No syncope.  GASTROINTESTINAL: No nausea, no vomiting or diarrhea. No abdominal pain. No melena or hematochezia.  GENITOURINARY: No dysuria or hematuria.  ENDOCRINE: No polyuria or nocturia. No heat or cold intolerance.  HEMATOLOGY: No anemia. No bruising. No bleeding.  INTEGUMENTARY: No rashes. No lesions.  MUSCULOSKELETAL: right leg wound (POA) NEUROLOGIC: No numbness, tingling, or ataxia. No seizure-type activity.  PSYCHIATRIC: No anxiety. No insomnia. No ADD.   DRUG ALLERGIES:   Allergies  Allergen Reactions  . Shellfish Allergy Anaphylaxis    VITALS:  Blood pressure 124/60, pulse 67, temperature 98 F (36.7 C), temperature source Oral, resp. rate 18, height 5\' 8"  (1.727 m), weight 64 kg, SpO2 100 %.  PHYSICAL EXAMINATION:   Physical Exam  GENERAL:  67 y.o.-year-old patient lying in the bed with no acute distress.  EYES: Pupils equal, round, reactive to light and accommodation. No scleral icterus. Extraocular muscles intact.  HEENT: Head atraumatic, normocephalic. Oropharynx and nasopharynx clear.  NECK:  Supple, no jugular venous distention. No thyroid enlargement, no tenderness.  LUNGS: Normal breath sounds bilaterally, no  wheezing, rales, rhonchi. No use of accessory muscles of respiration.  CARDIOVASCULAR: S1, S2 normal. No murmurs, rubs, or gallops.  ABDOMEN: Soft, nontender, nondistended. Bowel sounds present. No organomegaly or mass.  EXTREMITIES: No cyanosis, clubbing or edema b/l.    right leg bandage noted Wound present NEUROLOGIC: Cranial nerves II through XII are intact. No focal Motor or sensory deficits b/l.   PSYCHIATRIC: The patient is alert and oriented x 3.  SKIN: No obvious rash, lesion, or ulcer.   LABORATORY PANEL:   CBC Recent Labs  Lab 04/07/18 0539  WBC 13.3*  HGB 10.2*  HCT 33.6*  PLT 247   ------------------------------------------------------------------------------------------------------------------ Chemistries  Recent Labs  Lab 04/04/18 0534  04/07/18 0539  NA 140   < > 137  K 5.4*   < > 3.6  CL 100   < > 97*  CO2 24   < > 30  GLUCOSE 82   < > 82  BUN 59*   < > 15  CREATININE 9.50*   < > 4.36*  CALCIUM 10.0   < > 8.9  AST 18  --   --   ALT 5  --   --   ALKPHOS 70  --   --   BILITOT 0.4  --   --    < > = values in this interval not displayed.   ------------------------------------------------------------------------------------------------------------------  Cardiac Enzymes Recent Labs  Lab 04/04/18 0830  TROPONINI 0.05*   ------------------------------------------------------------------------------------------------------------------  RADIOLOGY:  No results found.   ASSESSMENT AND PLAN:  67 year old male patient with history of peripheral arterial disease, osteomyelitis of the left foot, recent angiogram and  left common femoral artery and proximal SFA, hypertension, gout, chronic congestive heart failure, peripheral vascular disease currently under hospitalist service  -Acute encephalopathy resolved Probably secondary to gabapentin toxicity Decreased dose and resume gabapentin 300 mg nightly and patient better  -Right leg cellulitis and wound  infection Systemic inflammatory response syndrome improving Continue IV vancomycin, IV cefepime and IV Flagyl antibiotics  -Right leg wound nonhealing Wound care consultation Vascular surgery evaluation for debridement and further management of the wound if needed Vascular surgery follow-up done today Angiogram of the lower extremity on Monday  -Mild hyperkalemia improved after dialysis   -End-stage renal disease Continue dialysis as per nephrology   -DVT prophylaxis On oral Eliquis for anticoagulation  -Peripheral vascular disease Continue aspirin and statin  -Mildly elevated troponin probably secondary to demand ischemia  -Disposition Once her left lower extremity circulation has been taken care of by vascular surgery and patient can be discharged home with home health services   All the records are reviewed and case discussed with Care Management/Social Worker. Management plans discussed with the patient, family and they are in agreement.  CODE STATUS: Full code  DVT Prophylaxis: SCDs  TOTAL TIME TAKING CARE OF THIS PATIENT: 33 minutes.   POSSIBLE D/C IN 1 DAYS, DEPENDING ON CLINICAL CONDITION.  Saundra Shelling M.D on 04/07/2018 at 1:15 PM  Between 7am to 6pm - Pager - [phone]  After 6pm go to [email or web address] - password [email or web address] [facility]  Office  [phone]  CC: Primary care physician; [name], MD  Note: This dictation was prepared with Dragon dictation along with smaller phrase technology. Any transcriptional errors that result from this process are unintentional.

## 2018-04-07 NOTE — Progress Notes (Signed)
Subjective  -   No acute events   Physical Exam:  Brisk DP/PT left leg signals Monophasic right PT, brisk right AT  Left 4th toe dry ulcer Large right calf wound         Assessment/Plan:    Based on dampened right pedal signals, the patient will be scheduled for angiogram of the right leg with possible intervention on Monday.  I discussed this with the patient and his family.  He will need wound care as an outpatient.  NPO after midnight on Sunday.  Continue abx and wound care to right leg  Wells Brabham 04/07/2018 1:44 PM --  Vitals:   04/07/18 0623 04/07/18 1131  BP: 110/64 124/60  Pulse: 76 67  Resp: 18 18  Temp: 98.2 F (36.8 C) 98 F (36.7 C)  SpO2: 100% 100%    Intake/Output Summary (Last 24 hours) at 04/07/2018 1344 Last data filed at 04/07/2018 0952 Gross per 24 hour  Intake 952.33 ml  Output 1516 ml  Net -563.67 ml     Laboratory CBC    Component Value Date/Time   WBC 13.3 (H) 04/07/2018 0539   HGB 10.2 (L) 04/07/2018 0539   HGB 8.8 (L) 01/17/2014 0502   HCT 33.6 (L) 04/07/2018 0539   HCT 28.1 (L) 02/28/2018 1432   PLT 247 04/07/2018 0539   PLT 212 01/17/2014 0502    BMET    Component Value Date/Time   NA 137 04/07/2018 0539   NA 139 01/17/2014 0502   K 3.6 04/07/2018 0539   K 4.8 01/17/2014 0502   CL 97 (L) 04/07/2018 0539   CL 102 01/17/2014 0502   CO2 30 04/07/2018 0539   CO2 27 01/17/2014 0502   GLUCOSE 82 04/07/2018 0539   GLUCOSE 101 (H) 01/17/2014 0502   BUN 15 04/07/2018 0539   BUN 62 (H) 01/17/2014 0502   CREATININE 4.36 (H) 04/07/2018 0539   CREATININE 12.30 (H) 01/17/2014 0502   CALCIUM 8.9 04/07/2018 0539   CALCIUM 8.2 (L) 01/17/2014 0502   GFRNONAA 13 (L) 04/07/2018 0539   GFRNONAA 4 (L) 01/17/2014 0502   GFRAA 15 (L) 04/07/2018 0539   GFRAA 4 (L) 01/17/2014 0502    COAG Lab Results  Component Value Date   INR 1.10 03/22/2018   INR 1.18 09/28/2015   INR 1.04 09/15/2015   No results found for:  PTT  Antibiotics Anti-infectives (From admission, onward)   Start     Dose/Rate Route Frequency Ordered Stop   04/06/18 1200  vancomycin (VANCOCIN) IVPB 750 mg/150 ml premix     750 mg 150 mL/hr over 60 Minutes Intravenous Every M-W-F (Hemodialysis) 04/06/18 0942     04/05/18 1000  ceFEPIme (MAXIPIME) 1 g in sodium chloride 0.9 % 100 mL IVPB     1 g 200 mL/hr over 30 Minutes Intravenous Every 24 hours 04/04/18 0859     04/04/18 1200  vancomycin (VANCOCIN) IVPB 750 mg/150 ml premix  Status:  Discontinued     75 0 mg 150 mL/hr over 60 Minutes Intravenous Every M-W-F (Hemodialysis) 04/04/18 0859 04/05/18 1140   04/04/18 0900  vancomycin (VANCOCIN) 1,250 mg in sodium chloride 0.9 % 250 mL IVPB     1,250 mg 166.7 mL/hr over 90 Minutes Intravenous STAT 04/04/18 0852 04/05/18 0900   04/04/18 0800  ceFEPIme (MAXIPIME) 2 g in sodium chloride 0.9 % 100 mL IVPB     2 g 200 mL/hr over 30 Minutes Intravenous  Once 04/04/18 0753 04/04/18 0855  04/04/18 0800  metroNIDAZOLE (FLAGYL) IVPB 500 mg     500 mg 100 mL/hr over 60 Minutes Intravenous Every 8 hours 04/04/18 0753     04/04/18 0800  vancomycin (VANCOCIN) IVPB 1000 mg/200 mL premix  Status:  Discontinued     1,000 mg 200 mL/hr over 60 Minutes Intravenous  Once 04/04/18 0753 04/04/18 0852       V. Leia Alf, M.D. [facility] Office: [phone] Pager:  [phone]

## 2018-04-07 NOTE — Progress Notes (Signed)
Patient's Sister and Nephew c/o patient's cellphone being missing. Family, this RN, and tech all searched the room and did not find it. Family stated they would check with the rest of the family to see if one of them took it home for safekeeping. Per Tech, she did not see the cellphone at all yesterday.

## 2018-04-07 NOTE — Progress Notes (Signed)
[facility], [place] 04/07/18  Subjective:   Patient is doing fair Somewhat lethargic again today Denies any acute complaints, nausea or vomiting   Objective:  Vital signs in last 24 hours:  Temp:  [98 F (36.7 C)-99 F (37.2 C)] 98 F (36.7 C) (11/30 1131) Pulse Rate:  [51-92] 67 (11/30 1131) Resp:  [13-18] 18 (11/30 1131) BP: (110-147)/(51-84) 124/60 (11/30 1131) SpO2:  [83 %-100 %] 100 % (11/30 1131) Weight:  [64 kg] 64 kg (11/29 1415)  Weight change:  Filed Weights   04/04/18 1705 04/06/18 1015 04/06/18 1415  Weight: 65.1 kg 65.4 kg 64 kg    Intake/Output:    Intake/Output Summary (Last 24 hours) at 04/07/2018 1325 Last data filed at 04/07/2018 2725 Gross per 24 hour  Intake 952.33 ml  Output 1516 ml  Net -563.67 ml     Physical Exam: General:  No acute distress, laying in the bed  HEENT  moist oral mucous membranes  Neck  supple  Pulm/lungs  normal breathing effort, clear  CVS/Heart  irregular rhythm  Abdomen:   Soft, nontender  Extremities:  No edema,  Rt leg bandage  Neurologic:  Lethargic, but able to answer questions  Skin:  Skin  Access:  Right IJ PermCath       Basic Metabolic Panel:  Recent Labs  Lab 04/04/18 0534 04/05/18 0530 04/07/18 0539  NA 140 138 137  K 5.4* 4.3 3.6  CL 100 100 97*  CO2 24 26 30   GLUCOSE 82 68* 82  BUN 59* 24* 15  CREATININE 9.50* 5.44* 4.36*  CALCIUM 10.0 9.1 8.9     CBC: Recent Labs  Lab 04/04/18 0534 04/05/18 0530 04/06/18 0342 04/07/18 0539  WBC 11.7* 12.4* 14.0* 13.3*  NEUTROABS 8.0*  --   --   --   HGB 10.1* 9.9* 10.1* 10.2*  HCT 32.8* 32.6* 33.1* 33.6*  MCV 101.2* 100.3* 100.6* 100.6*  PLT 300 216 238 247      Lab Results  Component Value Date   HEPBSAG Negative 09/30/2015   HEPBSAB Reactive 01/29/2018      Microbiology:  Recent Results (from the past 240 hour(s))  Culture, blood (routine x 2)     Status: None (Preliminary result)   Collection  Time: 04/04/18  5:34 AM  Result Value Ref Range Status   Specimen Description BLOOD BLOOD RIGHT HAND  Final   Special Requests   Final    BOTTLES DRAWN AEROBIC AND ANAEROBIC Blood Culture adequate volume   Culture   Final    NO GROWTH 3 DAYS Performed at [facility], [address] [place], [place] [address]    Report Status PENDING  Incomplete  Culture, blood (routine x 2)     Status: None (Preliminary result)   Collection Time: 04/04/18  5:35 AM  Result Value Ref Range Status   Specimen Description BLOOD BLOOD LEFT HAND  Final   Special Requests   Final    BOTTLES DRAWN AEROBIC AND ANAEROBIC Blood Culture adequate volume   Culture   Final    NO GROWTH 3 DAYS Performed at [facility], [address] [place], [place] [address]    Report Status PENDING  Incomplete  MRSA PCR Screening     Status: None   Collection Time: 04/04/18  6:22 PM  Result Value Ref Range Status   MRSA by PCR NEGATIVE NEGATIVE Final    Comment:        The GeneXpert MRSA Assay (  FDA approved for NASAL specimens only), is one component of a comprehensive MRSA colonization surveillance program. It is not intended to diagnose MRSA infection nor to guide or monitor treatment for MRSA infections. Performed at [facility], [address] [place], [place] [address]     Coagulation Studies: No results for input(s): LABPROT, INR in the last 72 hours.  Urinalysis: No results for input(s): COLORURINE, LABSPEC, PHURINE, GLUCOSEU, HGBUR, BILIRUBINUR, KETONESUR, PROTEINUR, UROBILINOGEN, NITRITE, LEUKOCYTESUR in the last 72 hours.  Invalid input(s): APPERANCEUR    Imaging: No results found.   Medications:   . sodium chloride Stopped (04/05/18 1628)  . ceFEPime (MAXIPIME) IV Stopped (04/06/18 1751)  . metronidazole 500 mg (04/07/18 0900)  . vancomycin Stopped (04/06/18 1350)   . allopurinol  100 mg Oral Daily  . amLODipine  10 mg Oral QHS  . apixaban  2.5 mg Oral BID   . aspirin EC  81 mg Oral Daily  . atorvastatin  10 mg Oral Daily  . calcium acetate  1,334 mg Oral TID WC  . Chlorhexidine Gluconate Cloth  6 each Topical Q0600  . clopidogrel  75 mg Oral Daily  . epoetin (EPOGEN/PROCRIT) injection  4,000 Units Intravenous Q M,W,F-HD  . furosemide  40 mg Oral Daily  . gabapentin  300 mg Oral QHS  . hydrALAZINE  10 mg Intravenous Once  . Melatonin  2.5 mg Oral QPM  . metoprolol succinate  25 mg Oral QHS  . multivitamin  1 tablet Oral QHS  . sodium chloride flush  3 mL Intravenous Q12H     Assessment/ Plan:  67 y.o. African-American male with end stage renal disease on hemodialysis, congestive heart failure, hypertension, peripheral vascular disease  [place]. MWF L AVF 61kg  1.  End-stage renal disease with hyperkalemia 2.  Anemia of chronic kidney disease- EPO with HD 3.  Secondary hyperparathyroidism- Continue home dose of binders with meals 4.  Altered mental status 5.  Left foot wound  Mental status improved after reducing the dose of gabapentin.  Now adjusted to 300 mg at night. Currently getting antibiotics for concern of left foot infection/osteomyelitis. Plan for next routine hemodialysis on Monday Continue Epogen supplementation with hemodialysis    LOS: 3 Johnny [name] 11/30/20191:25 PM  [place], [place]  Note: This note was prepared with Dragon dictation. Any transcription errors are unintentional

## 2018-04-08 NOTE — Progress Notes (Signed)
[facility], [place] 04/08/18  Subjective:   Patient is doing fair Denies any acute complaints, nausea or vomiting Evaluated by vascular surgeon  Objective:  Vital signs in last 24 hours:  Temp:  [98 F (36.7 C)-98.2 F (36.8 C)] 98.1 F (36.7 C) (12/01 0455) Pulse Rate:  [67-77] 71 (12/01 0455) Resp:  [16-18] 16 (12/01 0455) BP: (112-124)/(60-68) 112/68 (12/01 0455) SpO2:  [100 %] 100 % (12/01 0455)  Weight change:  Filed Weights   04/04/18 1705 04/06/18 1015 04/06/18 1415  Weight: 65.1 kg 65.4 kg 64 kg    Intake/Output:    Intake/Output Summary (Last 24 hours) at 04/08/2018 1026 Last data filed at 04/08/2018 0200 Gross per 24 hour  Intake 468.64 ml  Output -  Net 468.64 ml     Physical Exam: General:  No acute distress, laying in the bed  HEENT  moist oral mucous membranes  Neck  supple  Pulm/lungs  normal breathing effort, clear  CVS/Heart  regular rhythm, 2/6 murmur  Abdomen:   Soft, nontender  Extremities:  No edema,  Rt leg bandage  Neurologic:  Alert, able to answer questions  Skin:  Skin  Access:  Right IJ PermCath       Basic Metabolic Panel:  Recent Labs  Lab 04/04/18 0534 04/05/18 0530 04/07/18 0539  NA 140 138 137  K 5.4* 4.3 3.6  CL 100 100 97*  CO2 24 26 30   GLUCOSE 82 68* 82  BUN 59* 24* 15  CREATININE 9.50* 5.44* 4.36*  CALCIUM 10.0 9.1 8.9     CBC: Recent Labs  Lab 04/04/18 0534 04/05/18 0530 04/06/18 0342 04/07/18 0539  WBC 11.7* 12.4* 14.0* 13.3*  NEUTROABS 8.0*  --   --   --   HGB 10.1* 9.9* 10.1* 10.2*  HCT 32.8* 32.6* 33.1* 33.6*  MCV 101.2* 100.3* 100.6* 100.6*  PLT 300 216 238 247      Lab Results  Component Value Date   HEPBSAG Negative 09/30/2015   HEPBSAB Reactive 01/29/2018      Microbiology:  Recent Results (from the past 240 hour(s))  Culture, blood (routine x 2)     Status: None (Preliminary result)   Collection Time: 04/04/18  5:34 AM  Result Value Ref Range  Status   Specimen Description BLOOD BLOOD RIGHT HAND  Final   Special Requests   Final    BOTTLES DRAWN AEROBIC AND ANAEROBIC Blood Culture adequate volume   Culture   Final    NO GROWTH 4 DAYS Performed at [facility], [address] [place], [place] [address]    Report Status PENDING  Incomplete  Culture, blood (routine x 2)     Status: None (Preliminary result)   Collection Time: 04/04/18  5:35 AM  Result Value Ref Range Status   Specimen Description BLOOD BLOOD LEFT HAND  Final   Special Requests   Final    BOTTLES DRAWN AEROBIC AND ANAEROBIC Blood Culture adequate volume   Culture   Final    NO GROWTH 4 DAYS Performed at [facility], [address] [place], [place] [address]    Report Status PENDING  Incomplete  MRSA PCR Screening     Status: None   Collection Time: 04/04/18  6:22 PM  Result Value Ref Range Status   MRSA by PCR NEGATIVE NEGATIVE Final    Comment:        The GeneXpert MRSA Assay (FDA approved for NASAL specimens only), is one component of  a comprehensive MRSA colonization surveillance program. It is not intended to diagnose MRSA infection nor to guide or monitor treatment for MRSA infections. Performed at [facility], [address] [place], [place] [address]     Coagulation Studies: No results for input(s): LABPROT, INR in the last 72 hours.  Urinalysis: No results for input(s): COLORURINE, LABSPEC, PHURINE, GLUCOSEU, HGBUR, BILIRUBINUR, KETONESUR, PROTEINUR, UROBILINOGEN, NITRITE, LEUKOCYTESUR in the last 72 hours.  Invalid input(s): APPERANCEUR    Imaging: No results found.   Medications:   . sodium chloride Stopped (04/05/18 1628)  . ceFEPime (MAXIPIME) IV Stopped (04/07/18 1827)  . metronidazole 500 mg (04/08/18 0947)  . vancomycin Stopped (04/06/18 1350)   . allopurinol  100 mg Oral Daily  . amLODipine  10 mg Oral QHS  . apixaban  2.5 mg Oral BID  . aspirin EC  81 mg Oral Daily  . atorvastatin   10 mg Oral Daily  . calcium acetate  1,334 mg Oral TID WC  . Chlorhexidine Gluconate Cloth  6 each Topical Q0600  . clopidogrel  75 mg Oral Daily  . epoetin (EPOGEN/PROCRIT) injection  4,000 Units Intravenous Q M,W,F-HD  . furosemide  40 mg Oral Daily  . gabapentin  300 mg Oral QHS  . hydrALAZINE  10 mg Intravenous Once  . Melatonin  2.5 mg Oral QPM  . metoprolol succinate  25 mg Oral QHS  . multivitamin  1 tablet Oral QHS  . sodium chloride flush  3 mL Intravenous Q12H     Assessment/ Plan:  67 y.o. African-American male with end stage renal disease on hemodialysis, congestive heart failure, hypertension, peripheral vascular disease  [place]. MWF L AVF 61kg  1.  End-stage renal disease with hyperkalemia 2.  Anemia of chronic kidney disease- EPO with HD 3.  Secondary hyperparathyroidism- Continue home dose of binders with meals 4.  Altered mental status 5.  calf wound- vascular surgery evaluation in progress  Mental status improved after reducing the dose of gabapentin.  Now adjusted to 300 mg at night. Currently getting antibiotics for foot infection/cellulitis/osteomyelitis. Vascular planning angiogram of Rt leg Plan for next routine hemodialysis on Monday Continue Epogen supplementation with hemodialysis    LOS: 4 Jenipher Havel 12/1/201910:26 AM  [place], [place]  Note: This note was prepared with Dragon dictation. Any transcription errors are unintentional

## 2018-04-08 NOTE — Progress Notes (Addendum)
[place] [facility] [place] NAME: Johnny [name]    MR#:  [id number]  DATE OF BIRTH:  April 17, 1951  SUBJECTIVE:  CHIEF COMPLAINT:   Chief Complaint  Patient presents with  . Tremors  . Altered Mental Status  Patient seen and evaluated today Has okay appetite No tremors in the upper extremities No fevers No confusion No complaints of any chest pain  REVIEW OF SYSTEMS:    ROS  CONSTITUTIONAL: No documented fever.  Decreased fatigue, weakness. No weight gain, no weight loss.  EYES: No blurry or double vision.  ENT: No tinnitus. No postnasal drip. No redness of the oropharynx.  RESPIRATORY: No cough, no wheeze, no hemoptysis. No dyspnea.  CARDIOVASCULAR: No chest pain. No orthopnea. No palpitations. No syncope.  GASTROINTESTINAL: No nausea, no vomiting or diarrhea. No abdominal pain. No melena or hematochezia.  GENITOURINARY: No dysuria or hematuria.  ENDOCRINE: No polyuria or nocturia. No heat or cold intolerance.  HEMATOLOGY: No anemia. No bruising. No bleeding.  INTEGUMENTARY: No rashes. No lesions.  MUSCULOSKELETAL: right leg wound (POA) NEUROLOGIC: No numbness, tingling, or ataxia. No seizure-type activity.  PSYCHIATRIC: No anxiety. No insomnia. No ADD.   DRUG ALLERGIES:   Allergies  Allergen Reactions  . Shellfish Allergy Anaphylaxis    VITALS:  Blood pressure 124/69, pulse 74, temperature 98.8 F (37.1 C), temperature source Oral, resp. rate 20, height 5\' 8"  (1.727 m), weight 64 kg, SpO2 100 %.  PHYSICAL EXAMINATION:   Physical Exam  GENERAL:  67 y.o.-year-old patient lying in the bed with no acute distress.  EYES: Pupils equal, round, reactive to light and accommodation. No scleral icterus. Extraocular muscles intact.  HEENT: Head atraumatic, normocephalic. Oropharynx and nasopharynx clear.  NECK:  Supple, no jugular venous distention. No thyroid enlargement, no tenderness.  LUNGS: Normal breath sounds bilaterally, no  wheezing, rales, rhonchi. No use of accessory muscles of respiration.  CARDIOVASCULAR: S1, S2 normal. No murmurs, rubs, or gallops.  ABDOMEN: Soft, nontender, nondistended. Bowel sounds present. No organomegaly or mass.  EXTREMITIES: No cyanosis, clubbing or edema b/l.    right leg bandage noted Wound present NEUROLOGIC: Cranial nerves II through XII are intact. No focal Motor or sensory deficits b/l.   PSYCHIATRIC: The patient is alert and oriented x 3.  SKIN: No obvious rash, lesion, or ulcer.   LABORATORY PANEL:   CBC Recent Labs  Lab 04/07/18 0539  WBC 13.3*  HGB 10.2*  HCT 33.6*  PLT 247   ------------------------------------------------------------------------------------------------------------------ Chemistries  Recent Labs  Lab 04/04/18 0534  04/07/18 0539  NA 140   < > 137  K 5.4*   < > 3.6  CL 100   < > 97*  CO2 24   < > 30  GLUCOSE 82   < > 82  BUN 59*   < > 15  CREATININE 9.50*   < > 4.36*  CALCIUM 10.0   < > 8.9  AST 18  --   --   ALT 5  --   --   ALKPHOS 70  --   --   BILITOT 0.4  --   --    < > = values in this interval not displayed.   ------------------------------------------------------------------------------------------------------------------  Cardiac Enzymes Recent Labs  Lab 04/04/18 0830  TROPONINI 0.05*   ------------------------------------------------------------------------------------------------------------------  RADIOLOGY:  No results found.   ASSESSMENT AND PLAN:  67 year old male patient with history of peripheral arterial disease, osteomyelitis of the left foot, recent angiogram and left common  femoral artery and proximal SFA, hypertension, gout, chronic congestive heart failure, peripheral vascular disease currently under hospitalist service  -Acute encephalopathy resolved Probably secondary to gabapentin toxicity Decreased dose and resume gabapentin 300 mg nightly and patient better  -Right leg cellulitis and wound  infection Systemic inflammatory response syndrome secondary to above improved Continue IV cefepime and IV Flagyl antibiotics MRSA PCR negative Cultures blood negative continue IV vancomycin in view of bad right leg wound, cellulitis and local infection  -Right leg wound nonhealing Wound care consultation appreciated Angiogram tomorrow with further intervention by vascular surgery Will hold Eliquis, aspirin N.p.o. after midnight  -Left fourth toe dry ulcer Vascular surgery and wound care evaluation  -Mild hyperkalemia improved after dialysis   -End-stage renal disease Continue dialysis as per nephrology   -DVT prophylaxis Eliquis on hold for procedure tomorrow morning  -Peripheral vascular disease Continue aspirin and statin  -Mildly elevated troponin probably secondary to demand ischemia  -Disposition Once her left lower extremity circulation has been taken care of by vascular surgery and patient can be discharged home with home health services   All the records are reviewed and case discussed with Care Management/Social Worker. Management plans discussed with the patient, family and they are in agreement.  CODE STATUS: Full code  DVT Prophylaxis: SCDs  TOTAL TIME TAKING CARE OF THIS PATIENT: 33 minutes.   POSSIBLE D/C IN 2 to 3 DAYS, DEPENDING ON CLINICAL CONDITION.  Saundra Shelling M.D on 04/08/2018 at 12:10 PM  Between 7am to 6pm - Pager - [phone]  After 6pm go to [email or web address] - password [email or web address] [facility]  Office  [phone]  CC: Primary care physician; [name], MD  Note: This dictation was prepared with Dragon dictation along with smaller phrase technology. Any transcriptional errors that result from this process are unintentional.

## 2018-04-09 ENCOUNTER — Inpatient Hospital Stay
Admission: EM | Disposition: A | Discharge status: Skilled Nursing Facility | Payer: Self-pay | Source: Home / Self Care | Attending: Internal Medicine

## 2018-04-09 ENCOUNTER — Other Ambulatory Visit (INDEPENDENT_AMBULATORY_CARE_PROVIDER_SITE_OTHER): Payer: Self-pay | Admitting: Vascular Surgery

## 2018-04-09 DIAGNOSIS — L97519 Non-pressure chronic ulcer of other part of right foot with unspecified severity: Secondary | ICD-10-CM

## 2018-04-09 DIAGNOSIS — L97419 Non-pressure chronic ulcer of right heel and midfoot with unspecified severity: Secondary | ICD-10-CM

## 2018-04-09 DIAGNOSIS — I70261 Atherosclerosis of native arteries of extremities with gangrene, right leg: Secondary | ICD-10-CM

## 2018-04-09 HISTORY — PX: LOWER EXTREMITY ANGIOGRAPHY: CATH118251

## 2018-04-09 LAB — CBC
HCT: 33 % — ABNORMAL LOW (ref 39.0–52.0)
Hemoglobin: 10.1 g/dL — ABNORMAL LOW (ref 13.0–17.0)
MCH: 30.8 pg (ref 26.0–34.0)
MCHC: 30.6 g/dL (ref 30.0–36.0)
MCV: 100.6 fL — ABNORMAL HIGH (ref 80.0–100.0)
Platelets: 249 10*3/uL (ref 150–400)
RBC: 3.28 MIL/uL — ABNORMAL LOW (ref 4.22–5.81)
RDW: 16.2 % — AB (ref 11.5–15.5)
WBC: 12.3 10*3/uL — AB (ref 4.0–10.5)
nRBC: 0 % (ref 0.0–0.2)

## 2018-04-09 LAB — BASIC METABOLIC PANEL
Anion gap: 10 (ref 5–15)
BUN: 28 mg/dL — AB (ref 8–23)
CALCIUM: 9.2 mg/dL (ref 8.9–10.3)
CHLORIDE: 101 mmol/L (ref 98–111)
CO2: 27 mmol/L (ref 22–32)
CREATININE: 7.46 mg/dL — AB (ref 0.61–1.24)
GFR calc non Af Amer: 7 mL/min — ABNORMAL LOW (ref >60)
GFR, EST AFRICAN AMERICAN: 8 mL/min — AB (ref >60)
Glucose, Bld: 70 mg/dL (ref 70–99)
Potassium: 3.8 mmol/L (ref 3.5–5.1)
SODIUM: 138 mmol/L (ref 135–145)

## 2018-04-09 LAB — CULTURE, BLOOD (ROUTINE X 2)
CULTURE: NO GROWTH
Culture: NO GROWTH
SPECIAL REQUESTS: ADEQUATE
Special Requests: ADEQUATE

## 2018-04-09 LAB — VANCOMYCIN, TROUGH: VANCOMYCIN TR: 5 ug/mL — AB (ref 15–20)

## 2018-04-09 SURGERY — LOWER EXTREMITY ANGIOGRAPHY
Anesthesia: Moderate Sedation | Laterality: Right

## 2018-04-09 MED ORDER — VANCOMYCIN HCL 10 G IV SOLR
1500.0000 mg | Freq: Once | INTRAVENOUS | Status: AC
  Administered 2018-04-09: 1500 mg via INTRAVENOUS
  Filled 2018-04-09: qty 1500

## 2018-04-09 MED ORDER — METHYLPREDNISOLONE SODIUM SUCC 125 MG IJ SOLR
125.0000 mg | Freq: Once | INTRAMUSCULAR | Status: AC
  Administered 2018-04-09: 125 mg via INTRAVENOUS

## 2018-04-09 MED ORDER — MIDAZOLAM HCL 5 MG/5ML IJ SOLN
INTRAMUSCULAR | Status: AC
  Filled 2018-04-09: qty 5

## 2018-04-09 MED ORDER — HYDROMORPHONE HCL 1 MG/ML IJ SOLN
1.0000 mg | Freq: Once | INTRAMUSCULAR | Status: AC | PRN
  Administered 2018-04-10: 1 mg via INTRAVENOUS
  Filled 2018-04-09: qty 1

## 2018-04-09 MED ORDER — METHYLPREDNISOLONE SODIUM SUCC 125 MG IJ SOLR: 125.0000 mg | INTRAMUSCULAR | Status: DC | PRN

## 2018-04-09 MED ORDER — DIPHENHYDRAMINE HCL 50 MG/ML IJ SOLN: 50.0000 mg | Freq: Once | INTRAMUSCULAR | Status: DC

## 2018-04-09 MED ORDER — HEPARIN (PORCINE) IN NACL 1000-0.9 UT/500ML-% IV SOLN
INTRAVENOUS | Status: AC
  Filled 2018-04-09: qty 1000

## 2018-04-09 MED ORDER — HEPARIN SODIUM (PORCINE) 1000 UNIT/ML IJ SOLN
INTRAMUSCULAR | Status: DC | PRN
  Administered 2018-04-09: 5000 [IU] via INTRAVENOUS

## 2018-04-09 MED ORDER — ONDANSETRON HCL 4 MG/2ML IJ SOLN: 4.0000 mg | Freq: Four times a day (QID) | INTRAMUSCULAR | Status: DC | PRN

## 2018-04-09 MED ORDER — DIPHENHYDRAMINE HCL 50 MG/ML IJ SOLN
INTRAMUSCULAR | Status: AC
  Filled 2018-04-09: qty 1

## 2018-04-09 MED ORDER — MIDAZOLAM HCL 2 MG/2ML IJ SOLN
INTRAMUSCULAR | Status: AC
  Filled 2018-04-09: qty 2

## 2018-04-09 MED ORDER — MIDAZOLAM HCL 2 MG/2ML IJ SOLN
INTRAMUSCULAR | Status: DC | PRN
  Administered 2018-04-09 (×3): 1 mg via INTRAVENOUS
  Administered 2018-04-09: 2 mg via INTRAVENOUS

## 2018-04-09 MED ORDER — IOPAMIDOL (ISOVUE-300) INJECTION 61%
INTRAVENOUS | Status: DC | PRN
  Administered 2018-04-09: 35 mL via INTRA_ARTERIAL

## 2018-04-09 MED ORDER — LIDOCAINE-EPINEPHRINE (PF) 1 %-1:200000 IJ SOLN
INTRAMUSCULAR | Status: AC
  Filled 2018-04-09: qty 10

## 2018-04-09 MED ORDER — METHYLPREDNISOLONE SODIUM SUCC 125 MG IJ SOLR
INTRAMUSCULAR | Status: AC
  Filled 2018-04-09: qty 2

## 2018-04-09 MED ORDER — SODIUM CHLORIDE FLUSH 0.9 % IV SOLN
INTRAVENOUS | Status: AC
  Filled 2018-04-09: qty 10

## 2018-04-09 MED ORDER — FENTANYL CITRATE (PF) 100 MCG/2ML IJ SOLN
INTRAMUSCULAR | Status: DC | PRN
  Administered 2018-04-09: 50 ug via INTRAVENOUS
  Administered 2018-04-09 (×3): 25 ug via INTRAVENOUS

## 2018-04-09 MED ORDER — FENTANYL CITRATE (PF) 100 MCG/2ML IJ SOLN
INTRAMUSCULAR | Status: DC | PRN
  Administered 2018-04-09: 75 ug via INTRAVENOUS

## 2018-04-09 MED ORDER — DIPHENHYDRAMINE HCL 50 MG/ML IJ SOLN
INTRAMUSCULAR | Status: DC | PRN
  Administered 2018-04-09: 50 mg via INTRAVENOUS

## 2018-04-09 MED ORDER — CEFAZOLIN SODIUM-DEXTROSE 2-4 GM/100ML-% IV SOLN: 2.0000 g | INTRAVENOUS | Status: DC

## 2018-04-09 MED ORDER — SODIUM CHLORIDE 0.9 % IV SOLN: INTRAVENOUS | Status: DC

## 2018-04-09 MED ORDER — HEPARIN SODIUM (PORCINE) 1000 UNIT/ML IJ SOLN
INTRAMUSCULAR | Status: AC
  Filled 2018-04-09: qty 1

## 2018-04-09 MED ORDER — FENTANYL CITRATE (PF) 100 MCG/2ML IJ SOLN
INTRAMUSCULAR | Status: AC
  Filled 2018-04-09: qty 2

## 2018-04-09 MED ORDER — FAMOTIDINE 20 MG PO TABS: 40.0000 mg | ORAL_TABLET | ORAL | Status: DC | PRN

## 2018-04-09 SURGICAL SUPPLY — 21 items
BALLN LUTONIX 018 4X220X130 (BALLOONS) ×2
BALLN LUTONIX 018 6X80X130 (BALLOONS) ×2
BALLOON LUTONIX 018 4X220X130 (BALLOONS) ×1 IMPLANT
BALLOON LUTONIX 018 6X80X130 (BALLOONS) ×1 IMPLANT
CATH BEACON 5 .035 100 C2 TIP (CATHETERS) ×2 IMPLANT
CATH BEACON 5 .035 65 RIM TIP (CATHETERS) ×2 IMPLANT
CATH BEACON 5 .038 100 VERT TP (CATHETERS) ×2 IMPLANT
CATH DAV 5FR 125CM (CATHETERS) ×2 IMPLANT
CATH INFINITI JR4 5F (CATHETERS) ×2 IMPLANT
CATH PIG 70CM (CATHETERS) ×2 IMPLANT
DEVICE PRESTO INFLATION (MISCELLANEOUS) ×2 IMPLANT
DEVICE STARCLOSE SE CLOSURE (Vascular Products) ×2 IMPLANT
GLIDEWIRE ADV .035X260CM (WIRE) ×2 IMPLANT
GLIDEWIRE STIFF .35X180X3 HYDR (WIRE) ×2 IMPLANT
GUIDEWIRE PFTE-COATED .018X300 (WIRE) ×2 IMPLANT
PACK ANGIOGRAPHY (CUSTOM PROCEDURE TRAY) ×2 IMPLANT
SHEATH BRITE TIP 5FRX11 (SHEATH) ×2 IMPLANT
SHEATH HIGHFLEX ANSEL 6FRX55 (SHEATH) ×2 IMPLANT
TOWEL OR 17X26 4PK STRL BLUE (TOWEL DISPOSABLE) ×2 IMPLANT
TUBING CONTRAST HIGH PRESS 72 (TUBING) ×2 IMPLANT
WIRE J 3MM .035X145CM (WIRE) ×2 IMPLANT

## 2018-04-09 NOTE — H&P (Signed)
[facility] History & Physical Update  The patient was interviewed and re-examined.  The patient's previous History and Physical has been reviewed and is unchanged.  There is no change in the plan of care. We plan to proceed with the scheduled procedure.  Leotis Pain, MD  04/09/2018, 12:18 PM

## 2018-04-09 NOTE — Progress Notes (Signed)
HD tx start    04/09/18 1516  Vital Signs  Pulse Rate 70  Pulse Rate Source Monitor  Resp 18  BP (!) 141/65  BP Location Right Arm  BP Method Automatic  Patient Position (if appropriate) Lying  Oxygen Therapy  SpO2 98 %  O2 Device Room Air  During Hemodialysis Assessment  Blood Flow Rate (mL/min) 400 mL/min  Arterial Pressure (mmHg) -180 mmHg  Venous Pressure (mmHg) 140 mmHg  Transmembrane Pressure (mmHg) 50 mmHg  Ultrafiltration Rate (mL/min) 570 mL/min  Dialysate Flow Rate (mL/min) 800 ml/min  Conductivity: Machine  14.1  HD Safety Checks Performed Yes  Dialysis Fluid Bolus Normal Saline  Bolus Amount (mL) 250 mL  Intra-Hemodialysis Comments Tx initiated  Hemodialysis Catheter Right Subclavian Double-lumen  Placement Date/Time: (c) (c)   Placed prior to admission: No  Orientation: Right  Access Location: Subclavian  Hemodialysis Catheter Type: Double-lumen  Blue Lumen Status Infusing  Red Lumen Status Infusing

## 2018-04-09 NOTE — Progress Notes (Deleted)
HD tx end    04/09/18 1835  Vital Signs  Pulse Rate 93  Pulse Rate Source Monitor  Resp (!) 26  BP (!) 141/77  BP Location Right Arm  BP Method Automatic  Patient Position (if appropriate) Lying  Oxygen Therapy  SpO2 100 %  O2 Device Room Air  During Hemodialysis Assessment  Dialysis Fluid Bolus Normal Saline  Bolus Amount (mL) 250 mL  Intra-Hemodialysis Comments Tx completed

## 2018-04-09 NOTE — Progress Notes (Addendum)
Pharmacy Antibiotic Note  Johnny [name]. is a 67 y.o. male admitted on 04/04/2018 with sepsis.  Pharmacy has been consulted for cefepime and vancomycin dosing. Afeb, WBC 11.7, lactic 1.28.  HD MWF. Has known osteomyelitis. Risk for bacteremia.   Vancomycin d/c 04/05/18,  Then restarted 11/29  Plan:  Day 6-  cefepime 1 g IV q24H -Hemodialysis dosing.   No loading dose was given, need through it was ordered. Patient is on  Vancomcyin 750 mg Qhemodialysis- M,W,F. Vancomycin level was ordered and returned at 5 mcg/mL. Will load the patient again with vancomycin 1500 mg dose prior to hemodialysis on 12/2 and continue the current maintenance dose.   Height: 5\' 8"  (172.7 cm) Weight: 141 lb 1.5 oz (64 kg) IBW/kg (Calculated) : 68.4  Temp (24hrs), Avg:98.7 F (37.1 C), Min:98.3 F (36.8 C), Max:99 F (37.2 C)  Recent Labs  Lab 04/04/18 0534 04/04/18 0557 04/05/18 0530 04/06/18 0342 04/07/18 0539 04/09/18 0550 04/09/18 0814  WBC 11.7*  --  12.4* 14.0* 13.3* 12.3*  --   CREATININE 9.50*  --  5.44*  --  4.36* 7.46*  --   LATICACIDVEN  --  1.28  --   --   --   --   --   VANCOTROUGH  --   --   --   --   --   --  5*    Estimated Creatinine Clearance: 8.5 mL/min (A) (by C-G formula based on SCr of 7.46 mg/dL (H)).    Allergies  Allergen Reactions  . Shellfish Allergy Anaphylaxis    Antimicrobials this admission: 11/27 cefepime >>  11/27 vancomycin >>  11/27 metronidazole >>   Dose adjustments this admission: On HD MWF  Microbiology results: 11/27 BCx: NGTD  MRSA PCR neg.  Thank you for allowing pharmacy to be a part of this patient's care.  Oswald Hillock, PharmD  Clinical pharmacist 04/09/2018 10:55 AM

## 2018-04-09 NOTE — Progress Notes (Signed)
[facility] Daily Progress Note   Communication Note We will plan on right lower extremity distal calf debridement with VAC placement on Wednesday 04/11/18 with Dr. Lucky Cowboy  Will pre-op.  Marcelle Overlie PA-C 04/09/2018 2:43 PM

## 2018-04-09 NOTE — Progress Notes (Signed)
Pre HD assessment    04/09/18 1508  Vital Signs  Temp 97.6 F (36.4 C)  Temp Source Axillary  Pulse Rate 69  Pulse Rate Source Monitor  Resp 16  BP 131/66  BP Location Right Arm  BP Method Automatic  Patient Position (if appropriate) Lying  Oxygen Therapy  SpO2 100 %  O2 Device Room Air  Pain Assessment  Pain Scale 0-10  Pain Score 0  Dialysis Weight  Weight 63 kg  Type of Weight Pre-Dialysis  Time-Out for Hemodialysis  What Procedure? HD  Pt Identifiers(min of two) First/Last Name;MRN/Account#  Correct Site? Yes  Correct Side? Yes  Correct Procedure? Yes  Consents Verified? Yes  Rad Studies Available? N/A  Safety Precautions Reviewed? Yes  Research scientist (physical sciences)  (1A)  Station Number 1  UF/Alarm Test Passed  Conductivity: Meter 14  Conductivity: Machine  14.1  pH 7.4  Reverse Osmosis main  Normal Saline Lot Number [id number]  Dialyzer Lot Number [id number]  Disposable Set Lot Number [id number]  Machine Temperature 98.6 F (37 C)  Musician and Audible Yes  Blood Lines Intact and Secured Yes  Pre Treatment Patient Checks  Vascular access used during treatment Catheter  Hepatitis B Surface Antigen Results Negative  Date Hepatitis B Surface Antigen Drawn 09/29/17  Hepatitis B Surface Antibody  (>10)  Date Hepatitis B Surface Antibody Drawn 01/29/18  Hemodialysis Consent Verified Yes  Hemodialysis Standing Orders Initiated Yes  ECG (Telemetry) Monitor On Yes  Prime Ordered Normal Saline  Length of  DialysisTreatment -hour(s) 3.5 Hour(s)  Dialyzer Elisio 17H NR  Dialysate 3K, 2.5 Ca  Dialysis Anticoagulant None  Dialysate Flow Ordered 800  Blood Flow Rate Ordered 400 mL/min  Ultrafiltration Goal 1.5 Liters  Pre Treatment Labs Other (Comment) (vancomycin random )  Dialysis Blood Pressure Support Ordered Normal Saline  Education / Care Plan  Dialysis Education Provided Yes  Documented Education in Care Plan Yes  Hemodialysis Catheter Right  Subclavian Double-lumen  Placement Date/Time: (c) (c)   Placed prior to admission: No  Orientation: Right  Access Location: Subclavian  Hemodialysis Catheter Type: Double-lumen  Blue Lumen Status Infusing  Red Lumen Status Infusing

## 2018-04-09 NOTE — Progress Notes (Signed)
Post HD assessment    04/09/18 1911  Neurological  Level of Consciousness Alert  Orientation Level Other (comment) (confusion)  Respiratory  Respiratory Pattern Regular;Unlabored  Chest Assessment Chest expansion symmetrical  Cardiac  ECG Monitor Yes  Cardiac Rhythm NSR  Vascular  R Radial Pulse +2  L Radial Pulse +2  Integumentary  Integumentary (WDL) X  Skin Color Appropriate for ethnicity  Musculoskeletal  Musculoskeletal (WDL) X  Generalized Weakness Yes  Assistive Device None  GU Assessment  Genitourinary (WDL) X  Genitourinary Symptoms  (HD)  Psychosocial  Psychosocial (WDL) WDL

## 2018-04-09 NOTE — Op Note (Signed)
[facility]  Percutaneous Study/Intervention Procedural Note   Date of Surgery: 04/09/2018  Surgeon(s):Tonie Elsey    Assistants:none  Pre-operative Diagnosis: PAD with gangrenous changes to the right foot and a large right heel ulcer  Post-operative diagnosis:  Same  Procedure(s) Performed:             1.  Ultrasound guidance for vascular access left femoral artery             2.  Catheter placement into right SFA from left femoral approach             3.  Selective right lower extremity angiogram             4.  Percutaneous transluminal angioplasty of the right anterior tibial artery with a 4 mm diameter by 22 cm length Lutonix drug-coated angioplasty balloon             5.   Percutaneous transluminal angioplasty of the right popliteal artery with a 6 mm diameter by 8 cm length Lutonix drug-coated angioplasty balloon  6.  StarClose closure device left femoral artery  EBL: 10 cc  Contrast: 35 cc  Fluoro Time: 11.5 minutes  Moderate Conscious Sedation Time: approximately 40 minutes using 5 mg of Versed and 125 Mcg of Fentanyl              Indications:  Patient is a 67 y.o.male with persistent nonhealing ulcerations of the right heel and foot as well as gangrenous changes to the right fifth toe. The patient has recently undergone left lower extremity revascularization for ulcerations and seems to be doing well from that.  The patient is brought in for angiography for further evaluation and potential treatment.  Due to the limb threatening nature of the situation, angiogram was performed for attempted limb salvage. The patient is aware that if the procedure fails, amputation would be expected.  The patient also understands that even with successful revascularization, amputation may still be required due to the severity of the situation. Risks and benefits are discussed and informed consent is obtained.   Procedure:  The patient was identified and appropriate  procedural time out was performed.  The patient was then placed supine on the table and prepped and draped in the usual sterile fashion. Moderate conscious sedation was administered during a face to face encounter with the patient throughout the procedure with my supervision of the RN administering medicines and monitoring the patient's vital signs, pulse oximetry, telemetry and mental status throughout from the start of the procedure until the patient was taken to the recovery room. Ultrasound was used to evaluate the left common femoral artery.  It was patent although calcific and diseased.  A digital ultrasound image was acquired.  A Seldinger needle was used to access the left common femoral artery under direct ultrasound guidance and a permanent image was performed.  A 0.035 J wire was advanced without resistance and a 5Fr sheath was placed.   An aortogram was not performed as one was recently done and no intervention was required in the aorta or iliac segments. I then crossed the aortic bifurcation with a rim catheter and advanced to the right femoral head and then into the proximal to mid right SFA to opacify distally with the help of a Glidewire. Selective right lower extremity angiogram was then performed. This demonstrated  A normal common femoral artery, no focal stenosis within the profunda femoris artery although it was somewhat small and diseased beyond its  primary branches, and a normal proximal superficial femoral artery.  The SFA was very calcific but only had mild stenosis.  The above-knee popliteal artery had an area of in-stent stenosis in the 55 to 60% range that was largely due to a very calcific artery compressing the stent partially.  The anterior tibial artery had about an 80% stenosis at its origin, about a 75% stenosis about 5 cm beyond its origin, and another stenosis in the 70% range about 12 to 15 cm beyond its origin.  The peroneal artery was continuous with only mild stenosis in the  tibioperoneal trunk and proximal peroneal artery.  The posterior tibial artery was chronically occluded with poor distal reconstitution. It was felt that it was in the patient's best interest to proceed with intervention after these images to avoid a second procedure and a larger amount of contrast and fluoroscopy based off of the findings from the initial angiogram. The patient was systemically heparinized and a 6 [place] Ansell sheath was then placed over the [employer] wire. I then used a Kumpe catheter and the advantage wire to the popliteal stenosis and get down to near the anterior tibial origin.  This was a very angulated origin, and multiple catheters were tried with eventually a JR4 catheter and a 0.018 advantage wire being used to cross the stenoses in the anterior tibial artery and parked the wire in the foot.  I had previously treated this artery with a 3 mm balloon and it was a fairly generous artery so I elected to gently use a 4 mm diameter by 22 cm length Lutonix drug-coated angioplasty balloon in the proximal and mid right anterior tibial artery.  This was taken to 6 atm for 1 minute.  Completion imaging showed marked improvement with only about a 20 to 25% residual stenosis in the 2 proximal areas of stenosis and about a 30% residual stenosis in the mid anterior tibial artery.  I then turned my attention to the popliteal lesion.  A 6 mm diameter by 8 cm length Lutonix drug-coated angioplasty balloon was inflated to 12 atm for 1 minute.  Completion imaging showed only about a 25 to 30% residual stenosis. I elected to terminate the procedure. The sheath was removed and StarClose closure device was deployed in the left femoral artery with excellent hemostatic result. The patient was taken to the recovery room in stable condition having tolerated the procedure well.  Findings:                            Right Lower Extremity:  Normal common femoral artery, no focal stenosis within the profunda  femoris artery although it was somewhat small and diseased beyond its primary branches, and a normal proximal superficial femoral artery.  The SFA was very calcific but only had mild stenosis.  The above-knee popliteal artery had an area of in-stent stenosis in the 55 to 60% range that was largely due to a very calcific artery compressing the stent partially.  The anterior tibial artery had about an 80% stenosis at its origin, about a 75% stenosis about 5 cm beyond its origin, and another stenosis in the 70% range about 12 to 15 cm beyond its origin.  The peroneal artery was continuous with only mild stenosis in the tibioperoneal trunk and proximal peroneal artery.  The posterior tibial artery was chronically occluded with poor distal reconstitution.   Disposition: Patient was taken to the recovery room in stable  condition having tolerated the procedure well.  Complications: None  Leotis Pain 04/09/2018 1:31 PM   This note was created with Dragon Medical transcription system. Any errors in dictation are purely unintentional.

## 2018-04-09 NOTE — Progress Notes (Signed)
Johnny [name], [place] 04/09/18  Subjective:  Patient seen and evaluated at bedside. Due for hemodialysis today. Also scheduled for additional vascular procedure.   Objective:  Vital signs in last 24 hours:  Temp:  [98.3 F (36.8 C)-99 F (37.2 C)] 99 F (37.2 C) (12/02 0525) Pulse Rate:  [72-75] 72 (12/02 0525) Resp:  [20] 20 (12/02 0525) BP: (119-142)/(68-74) 119/68 (12/02 0525) SpO2:  [100 %] 100 % (12/02 0525)  Weight change:  Filed Weights   04/04/18 1705 04/06/18 1015 04/06/18 1415  Weight: 65.1 kg 65.4 kg 64 kg    Intake/Output:    Intake/Output Summary (Last 24 hours) at 04/09/2018 1222 Last data filed at 04/08/2018 1337 Gross per 24 hour  Intake 0 ml  Output -  Net 0 ml     Physical Exam: General:  No acute distress  HEENT  moist oral mucous membranes  Neck  supple  Pulm/lungs  normal breathing effort, clear  CVS/Heart  regular rhythm, 2/6 murmur  Abdomen:   Soft, nontender  Extremities:  No edema,  Rt leg bandage  Neurologic:  Alert, able to answer questions  Skin:  RLE wrapped  Access:  Right IJ PermCath       Basic Metabolic Panel:  Recent Labs  Lab 04/04/18 0534 04/05/18 0530 04/07/18 0539 04/09/18 0550  NA 140 138 137 138  K 5.4* 4.3 3.6 3.8  CL 100 100 97* 101  CO2 24 26 30 27   GLUCOSE 82 68* 82 70  BUN 59* 24* 15 28*  CREATININE 9.50* 5.44* 4.36* 7.46*  CALCIUM 10.0 9.1 8.9 9.2     CBC: Recent Labs  Lab 04/04/18 0534 04/05/18 0530 04/06/18 0342 04/07/18 0539 04/09/18 0550  WBC 11.7* 12.4* 14.0* 13.3* 12.3*  NEUTROABS 8.0*  --   --   --   --   HGB 10.1* 9.9* 10.1* 10.2* 10.1*  HCT 32.8* 32.6* 33.1* 33.6* 33.0*  MCV 101.2* 100.3* 100.6* 100.6* 100.6*  PLT 300 216 238 247 249      Lab Results  Component Value Date   HEPBSAG Negative 09/30/2015   HEPBSAB Reactive 01/29/2018      Microbiology:  Recent Results (from the past 240 hour(s))  Culture, blood (routine x 2)     Status: None  (Preliminary result)   Collection Time: 04/04/18  5:34 AM  Result Value Ref Range Status   Specimen Description BLOOD BLOOD RIGHT HAND  Final   Special Requests   Final    BOTTLES DRAWN AEROBIC AND ANAEROBIC Blood Culture adequate volume   Culture   Final    NO GROWTH 4 DAYS Performed at [facility], [address] [place], [place] [address]    Report Status PENDING  Incomplete  Culture, blood (routine x 2)     Status: None (Preliminary result)   Collection Time: 04/04/18  5:35 AM  Result Value Ref Range Status   Specimen Description BLOOD BLOOD LEFT HAND  Final   Special Requests   Final    BOTTLES DRAWN AEROBIC AND ANAEROBIC Blood Culture adequate volume   Culture   Final    NO GROWTH 4 DAYS Performed at [facility], [address], [place], [place] [address]    Report Status PENDING  Incomplete  MRSA PCR Screening     Status: None   Collection Time: 04/04/18  6:22 PM  Result Value Ref Range Status   MRSA by PCR NEGATIVE NEGATIVE Final    Comment:  The GeneXpert MRSA Assay (FDA approved for NASAL specimens only), is one component of a comprehensive MRSA colonization surveillance program. It is not intended to diagnose MRSA infection nor to guide or monitor treatment for MRSA infections. Performed at [facility], [address] [place], [place] [address]     Coagulation Studies: No results for input(s): LABPROT, INR in the last 72 hours.  Urinalysis: No results for input(s): COLORURINE, LABSPEC, PHURINE, GLUCOSEU, HGBUR, BILIRUBINUR, KETONESUR, PROTEINUR, UROBILINOGEN, NITRITE, LEUKOCYTESUR in the last 72 hours.  Invalid input(s): APPERANCEUR    Imaging: No results found.   Medications:   . [MAR Hold] sodium chloride Stopped (04/05/18 1628)  . [MAR Hold] ceFEPime (MAXIPIME) IV Stopped (04/09/18 0048)  . [MAR Hold] metronidazole 500 mg (04/09/18 1006)  . [MAR Hold] vancomycin    . [MAR Hold] vancomycin Stopped  (04/06/18 1350)   . [MAR Hold] allopurinol  100 mg Oral Daily  . [MAR Hold] amLODipine  10 mg Oral QHS  . [MAR Hold] atorvastatin  10 mg Oral Daily  . [MAR Hold] calcium acetate  1,334 mg Oral TID WC  . [MAR Hold] Chlorhexidine Gluconate Cloth  6 each Topical Q0600  . [MAR Hold] epoetin (EPOGEN/PROCRIT) injection  4,000 Units Intravenous Q M,W,F-HD  . [MAR Hold] furosemide  40 mg Oral Daily  . [MAR Hold] gabapentin  300 mg Oral QHS  . [MAR Hold] hydrALAZINE  10 mg Intravenous Once  . [MAR Hold] Melatonin  2.5 mg Oral QPM  . [MAR Hold] metoprolol succinate  25 mg Oral QHS  . [MAR Hold] multivitamin  1 tablet Oral QHS  . [MAR Hold] sodium chloride flush  3 mL Intravenous Q12H     Assessment/ Plan:  67 y.o. African-American male with end stage renal disease on hemodialysis, congestive heart failure, hypertension, peripheral vascular disease  Johnny [name]. MWF L AVF 61kg  1.  End-stage renal disease with hyperkalemia 2.  Anemia of chronic kidney disease- EPO with HD 3.  Secondary hyperparathyroidism- on binders 4.  Altered mental status, improved. 5.  calf wound- vascular surgery evaluation in progress  Plan:  Patient due for hemodialysis today.  Orders have been prepared.  We will continue dialysis on a MWF schedule.  Hemoglobin currently 10.1 and acceptable.  We will maintain the patient on Epogen.  Continue to periodically monitor serum phosphorus as well.   LOS: 5 Johnny [name] 12/2/201912:22 PM  [place], [place]  Note: This note was prepared with Dragon dictation. Any transcription errors are unintentional

## 2018-04-09 NOTE — Progress Notes (Signed)
HD tx end    04/09/18 1906  Vital Signs  Pulse Rate 93  Pulse Rate Source Monitor  Resp (!) 22  BP (!) 148/76  BP Location Right Arm  BP Method Automatic  Patient Position (if appropriate) Lying  Oxygen Therapy  SpO2 100 %  O2 Device Room Air  During Hemodialysis Assessment  Dialysis Fluid Bolus Normal Saline  Bolus Amount (mL) 250 mL  Intra-Hemodialysis Comments Tx completed

## 2018-04-09 NOTE — Progress Notes (Signed)
[place] [facility] [place] NAME: Johnny [name]    MR#:  [id number]  DATE OF BIRTH:  Apr 11, 1951  SUBJECTIVE:  CHIEF COMPLAINT:   Chief Complaint  Patient presents with  . Tremors  . Altered Mental Status  Patient seen and evaluated today Has okay appetite N.p.o. currently for vascular procedure No fevers No confusion No complaints of any chest pain  REVIEW OF SYSTEMS:    ROS  CONSTITUTIONAL: No documented fever.  Decreased fatigue, weakness. No weight gain, no weight loss.  EYES: No blurry or double vision.  ENT: No tinnitus. No postnasal drip. No redness of the oropharynx.  RESPIRATORY: No cough, no wheeze, no hemoptysis. No dyspnea.  CARDIOVASCULAR: No chest pain. No orthopnea. No palpitations. No syncope.  GASTROINTESTINAL: No nausea, no vomiting or diarrhea. No abdominal pain. No melena or hematochezia.  GENITOURINARY: No dysuria or hematuria.  ENDOCRINE: No polyuria or nocturia. No heat or cold intolerance.  HEMATOLOGY: No anemia. No bruising. No bleeding.  INTEGUMENTARY: No rashes. No lesions.  MUSCULOSKELETAL: right leg wound (POA) NEUROLOGIC: No numbness, tingling, or ataxia. No seizure-type activity.  PSYCHIATRIC: No anxiety. No insomnia. No ADD.   DRUG ALLERGIES:   Allergies  Allergen Reactions  . Shellfish Allergy Anaphylaxis    VITALS:  Blood pressure 119/68, pulse 72, temperature 99 F (37.2 C), temperature source Oral, resp. rate 20, height 5\' 8"  (1.727 m), weight 64 kg, SpO2 100 %.  PHYSICAL EXAMINATION:   Physical Exam  GENERAL:  67 y.o.-year-old patient lying in the bed with no acute distress.  EYES: Pupils equal, round, reactive to light and accommodation. No scleral icterus. Extraocular muscles intact.  HEENT: Head atraumatic, normocephalic. Oropharynx and nasopharynx clear.  NECK:  Supple, no jugular venous distention. No thyroid enlargement, no tenderness.  LUNGS: Normal breath sounds bilaterally, no  wheezing, rales, rhonchi. No use of accessory muscles of respiration.  CARDIOVASCULAR: S1, S2 normal. No murmurs, rubs, or gallops.  ABDOMEN: Soft, nontender, nondistended. Bowel sounds present. No organomegaly or mass.  EXTREMITIES: No cyanosis, clubbing or edema b/l.    right leg bandage noted Wound present NEUROLOGIC: Cranial nerves II through XII are intact. No focal Motor or sensory deficits b/l.   PSYCHIATRIC: The patient is alert and oriented x 3.  SKIN: No obvious rash, lesion, or ulcer.   LABORATORY PANEL:   CBC Recent Labs  Lab 04/09/18 0550  WBC 12.3*  HGB 10.1*  HCT 33.0*  PLT 249   ------------------------------------------------------------------------------------------------------------------ Chemistries  Recent Labs  Lab 04/04/18 0534  04/09/18 0550  NA 140   < > 138  K 5.4*   < > 3.8  CL 100   < > 101  CO2 24   < > 27  GLUCOSE 82   < > 70  BUN 59*   < > 28*  CREATININE 9.50*   < > 7.46*  CALCIUM 10.0   < > 9.2  AST 18  --   --   ALT 5  --   --   ALKPHOS 70  --   --   BILITOT 0.4  --   --    < > = values in this interval not displayed.   ------------------------------------------------------------------------------------------------------------------  Cardiac Enzymes Recent Labs  Lab 04/04/18 0830  TROPONINI 0.05*   ------------------------------------------------------------------------------------------------------------------  RADIOLOGY:  No results found.   ASSESSMENT AND PLAN:  67 year old male patient with history of peripheral arterial disease, osteomyelitis of the left foot, recent angiogram and left common femoral  artery and proximal SFA, hypertension, gout, chronic congestive heart failure, peripheral vascular disease currently under hospitalist service  -Acute encephalopathy resolved Probably secondary to gabapentin toxicity Decreased dose and resume gabapentin 300 mg nightly and patient better  -Right leg cellulitis and wound  infection with poor circulation Systemic inflammatory response syndrome secondary to above improving Continue IV cefepime and IV Flagyl antibiotics MRSA PCR negative Cultures blood negative continue IV vancomycin in view of bad right leg wound, cellulitis and local infection  -Right leg wound nonhealing Wound care consultation appreciated Angiogram today with further intervention by vascular surgery Will hold Eliquis, aspirin N.p.o. for the procedure  -Left fourth toe dry ulcer Vascular surgery and wound care evaluation to continue  -Mild hyperkalemia improved after dialysis   -End-stage renal disease Continue dialysis as per nephrology   -DVT prophylaxis Eliquis on hold for procedure tomorrow morning  -Peripheral vascular disease Continue aspirin and statin  -Mildly elevated troponin probably secondary to demand ischemia  -Disposition Once his left lower extremity circulation has been taken care of by vascular surgery and patient can be discharged home with home health services  All the records are reviewed and case discussed with Care Management/Social Worker. Management plans discussed with the patient, family and they are in agreement.  CODE STATUS: Full code  DVT Prophylaxis: SCDs  TOTAL TIME TAKING CARE OF THIS PATIENT: 32 minutes.   POSSIBLE D/C IN 2 to 3 DAYS, DEPENDING ON CLINICAL CONDITION.  Saundra Shelling M.D on 04/09/2018 at 12:51 PM  Between 7am to 6pm - Pager - [phone]  After 6pm go to [email or web address] - password [email or web address] [facility]  Office  [phone]  CC: Primary care physician; [name], MD  Note: This dictation was prepared with Dragon dictation along with smaller phrase technology. Any transcriptional errors that result from this process are unintentional.

## 2018-04-09 NOTE — Care Management Important Message (Signed)
Copy of signed IM left with patient in room.  

## 2018-04-09 NOTE — Progress Notes (Signed)
Dr. Lucky Cowboy came out to bedside and spoke with pt. And his friend [name] re: procedural results. Both verbalize understanding. Left groin clean, dry, intact at present without hematoma, drainage, ecchymosis. 1+ edema to groin site noted post procedure.

## 2018-04-09 NOTE — Progress Notes (Signed)
Patient just returned from vascular and is now going to dialysis

## 2018-04-09 NOTE — Progress Notes (Signed)
Pre HD assessment    04/09/18 1509  Neurological  Level of Consciousness Responds to Voice  Respiratory  Respiratory Pattern Regular;Unlabored  Chest Assessment Chest expansion symmetrical  Cardiac  ECG Monitor Yes  Cardiac Rhythm NSR  Vascular  R Radial Pulse +2  L Radial Pulse +2  Integumentary  Integumentary (WDL) X  Skin Color Appropriate for ethnicity  Musculoskeletal  Musculoskeletal (WDL) X  Generalized Weakness Yes  Assistive Device None  GU Assessment  Genitourinary (WDL) X  Genitourinary Symptoms  (HD)  Psychosocial  Psychosocial (WDL) WDL

## 2018-04-09 NOTE — Care Management (Signed)
Patient is top have angiogram of right leg and potential intervention today.

## 2018-04-09 NOTE — Progress Notes (Signed)
Post HD assessment   04/09/18 1912  Vital Signs  Temp 97.8 F (36.6 C)  Temp Source Oral  Pulse Rate 95  Pulse Rate Source Monitor  Resp 19  BP (!) 160/90  BP Location Right Arm  BP Method Automatic  Patient Position (if appropriate) Lying  Oxygen Therapy  SpO2 100 %  O2 Device Room Air  Dialysis Weight  Weight 61.9 kg  Type of Weight Post-Dialysis  Post-Hemodialysis Assessment  Rinseback Volume (mL) 250 mL  KECN 70.6 V  Dialyzer Clearance Lightly streaked  Duration of HD Treatment -hour(s) 3.5 hour(s)  Hemodialysis Intake (mL) 650 mL  UF Total -Machine (mL) 2273 mL  Net UF (mL) 1623 mL  Tolerated HD Treatment Yes  Education / Care Plan  Dialysis Education Provided Yes  Documented Education in Care Plan Yes  Hemodialysis Catheter Right Subclavian Double-lumen  Placement Date/Time: (c) (c)   Placed prior to admission: No  Orientation: Right  Access Location: Subclavian  Hemodialysis Catheter Type: Double-lumen  Site Condition No complications  Blue Lumen Status Heparin locked  Red Lumen Status Heparin locked  Purple Lumen Status N/A  Catheter fill solution Heparin 1000 units/ml  Catheter fill volume (Arterial) 1.8 cc  Catheter fill volume (Venous) 1.9  Dressing Type Biopatch  Dressing Status Clean;Dry;Intact  Drainage Description None  Post treatment catheter status Capped and Clamped

## 2018-04-09 NOTE — Progress Notes (Signed)
Post HD assessment. Pt tolerated tx well, without c/o or complication. Net UF 1623, goal met.    04/09/18 1912  Vital Signs  Temp 97.8 F (36.6 C)  Temp Source Oral  Pulse Rate 95  Pulse Rate Source Monitor  Resp 19  BP (!) 160/90  BP Location Right Arm  BP Method Automatic  Patient Position (if appropriate) Lying  Oxygen Therapy  SpO2 100 %  O2 Device Room Air  Dialysis Weight  Weight 61.9 kg  Type of Weight Post-Dialysis  Post-Hemodialysis Assessment  Rinseback Volume (mL) 250 mL  KECN 70.6 V  Dialyzer Clearance Lightly streaked  Duration of HD Treatment -hour(s) 3.5 hour(s)  Hemodialysis Intake (mL) 650 mL  UF Total -Machine (mL) 2273 mL  Net UF (mL) 1623 mL  Tolerated HD Treatment Yes  Education / Care Plan  Dialysis Education Provided Yes  Documented Education in Care Plan Yes  Hemodialysis Catheter Right Subclavian Double-lumen  Placement Date/Time: (c) (c)   Placed prior to admission: No  Orientation: Right  Access Location: Subclavian  Hemodialysis Catheter Type: Double-lumen  Site Condition No complications  Blue Lumen Status Heparin locked  Red Lumen Status Heparin locked  Purple Lumen Status N/A  Catheter fill solution Heparin 1000 units/ml  Catheter fill volume (Arterial) 1.8 cc  Catheter fill volume (Venous) 1.9  Dressing Type Biopatch  Dressing Status Clean;Dry;Intact  Drainage Description None  Post treatment catheter status Capped and Clamped

## 2018-04-10 ENCOUNTER — Encounter: Payer: Self-pay | Admitting: Vascular Surgery

## 2018-04-10 LAB — BASIC METABOLIC PANEL
Anion gap: 10 (ref 5–15)
BUN: 19 mg/dL (ref 8–23)
CALCIUM: 8.8 mg/dL — AB (ref 8.9–10.3)
CO2: 26 mmol/L (ref 22–32)
Chloride: 103 mmol/L (ref 98–111)
Creatinine, Ser: 4.97 mg/dL — ABNORMAL HIGH (ref 0.61–1.24)
GFR calc Af Amer: 13 mL/min — ABNORMAL LOW (ref >60)
GFR calc non Af Amer: 11 mL/min — ABNORMAL LOW (ref >60)
Glucose, Bld: 230 mg/dL — ABNORMAL HIGH (ref 70–99)
Potassium: 3.7 mmol/L (ref 3.5–5.1)
Sodium: 139 mmol/L (ref 135–145)

## 2018-04-10 LAB — CBC
HCT: 30.7 % — ABNORMAL LOW (ref 39.0–52.0)
Hemoglobin: 9.7 g/dL — ABNORMAL LOW (ref 13.0–17.0)
MCH: 31.6 pg (ref 26.0–34.0)
MCHC: 31.6 g/dL (ref 30.0–36.0)
MCV: 100 fL (ref 80.0–100.0)
Platelets: 200 10*3/uL (ref 150–400)
RBC: 3.07 MIL/uL — ABNORMAL LOW (ref 4.22–5.81)
RDW: 16.4 % — AB (ref 11.5–15.5)
WBC: 20.9 10*3/uL — ABNORMAL HIGH (ref 4.0–10.5)
nRBC: 0 % (ref 0.0–0.2)

## 2018-04-10 MED ORDER — OCUVITE-LUTEIN PO CAPS
1.0000 | ORAL_CAPSULE | Freq: Every day | ORAL | Status: DC
  Administered 2018-04-12: 1 via ORAL
  Filled 2018-04-10 (×4): qty 1

## 2018-04-10 MED ORDER — MELATONIN 5 MG PO TABS
5.0000 mg | ORAL_TABLET | Freq: Every evening | ORAL | Status: DC
  Administered 2018-04-11 – 2018-04-12 (×2): 5 mg via ORAL
  Filled 2018-04-10 (×3): qty 1

## 2018-04-10 MED ORDER — VITAMIN C 500 MG PO TABS
500.0000 mg | ORAL_TABLET | Freq: Two times a day (BID) | ORAL | Status: DC
  Administered 2018-04-10 – 2018-04-12 (×5): 500 mg via ORAL
  Filled 2018-04-10 (×5): qty 1

## 2018-04-10 MED ORDER — NEPRO/CARBSTEADY PO LIQD
237.0000 mL | Freq: Two times a day (BID) | ORAL | Status: DC
  Administered 2018-04-10 – 2018-04-13 (×4): 237 mL via ORAL

## 2018-04-10 NOTE — NC FL2 (Signed)
[place] LEVEL OF CARE SCREENING TOOL     IDENTIFICATION  Patient Name: Johnny [name]. Birthdate: 05/06/1951 Sex: male Admission Date (Current Location): 04/04/2018  [place] and [place] Number:  Engineering geologist and Address:  [facility], [address], [place], [place] [address]      Provider Number: [id number]  Attending Physician Name and Address:  Saundra Shelling, MD  Relative Name and Phone Number:       Current Level of Care: Hospital Recommended Level of Care: [place] Prior Approval Number:    Date Approved/Denied:   PASRR Number:    Discharge Plan: SNF    Current Diagnoses: Patient Active Problem List   Diagnosis Date Noted  . Pressure injury of skin 04/06/2018  . Altered mental status 04/04/2018  . Hypothermia 04/04/2018  . Hyperlipidemia 02/27/2018  . Diabetes ([place]) 02/27/2018  . Weakness of right lower extremity 01/20/2018  . Fever   . Periumbilical abdominal pain   . Confusion 12/22/2017  . Acute delirium 12/21/2017  . Protein-calorie malnutrition, severe 12/19/2017  . Intractable nausea and vomiting 12/18/2017  . Lymphedema 12/13/2017  . Cellulitis 11/27/2017  . Chest pain 11/19/2017  . Atherosclerosis of native arteries of the extremities with ulceration ([place]) 11/07/2017  . Elevated troponin 10/02/2015  . Complications, dialysis, catheter, mechanical ([place]) 10/02/2015  . Musculoskeletal chest pain 09/28/2015  . ESRD on dialysis ([place]) 09/28/2015  . HTN (hypertension) 09/28/2015  . Chronic diastolic CHF (congestive heart failure) ([place]) 09/28/2015  . Gout 09/28/2015    Orientation RESPIRATION BLADDER Height & Weight     Self, Place  Normal Continent Weight: 136 lb 7.4 oz (61.9 kg) Height:  5\' 8"  (172.7 cm)  BEHAVIORAL SYMPTOMS/MOOD NEUROLOGICAL BOWEL NUTRITION STATUS  (none) (none) Incontinent Diet(renal/carb modified)  AMBULATORY STATUS COMMUNICATION OF NEEDS Skin   Limited  Assist Verbally PU Stage and Appropriate Care                       Personal Care Assistance Level of Assistance  Bathing, Dressing Bathing Assistance: Limited assistance   Dressing Assistance: Limited assistance     Functional Limitations Info  (none)          SPECIAL CARE FACTORS FREQUENCY                       Contractures Contractures Info: Not present    Additional Factors Info  Code Status, Allergies Code Status Info: full Allergies Info: shellfish           Current Medications (04/10/2018):  This is the current hospital active medication list Current Facility-Administered Medications  Medication Dose Route Frequency Provider Last Rate Last Dose  . 0.9 %  sodium chloride infusion  250 mL Intravenous PRN Algernon Huxley, MD   Stopped at 04/05/18 1628  . acetaminophen (TYLENOL) tablet 650 mg  650 mg Oral Q6H PRN Algernon Huxley, MD   650 mg at 04/06/18 2135   Or  . acetaminophen (TYLENOL) suppository 650 mg  650 mg Rectal Q6H PRN Algernon Huxley, MD      . allopurinol (ZYLOPRIM) tablet 100 mg  100 mg Oral Daily Algernon Huxley, MD   100 mg at 04/10/18 0826  . amLODipine (NORVASC) tablet 10 mg  10 mg Oral QHS Algernon Huxley, MD   10 mg at 04/09/18 2056  . atorvastatin (LIPITOR) tablet 10 mg  10 mg Oral Daily Dew, Jason S,  MD   10 mg at 04/10/18 0825  . calcium acetate (PHOSLO) capsule 1,334 mg  1,334 mg Oral TID WC Algernon Huxley, MD   1,334 mg at 04/10/18 0826  . ceFEPIme (MAXIPIME) 1 g in sodium chloride 0.9 % 100 mL IVPB  1 g Intravenous Q24H Algernon Huxley, MD   Stopped at 04/09/18 2128  . Chlorhexidine Gluconate Cloth 2 % PADS 6 each  6 each Topical Q0600 Algernon Huxley, MD   6 each at 04/10/18 0528  . clonazePAM (KLONOPIN) tablet 0.5 mg  0.5 mg Oral BID PRN Algernon Huxley, MD   0.5 mg at 04/04/18 2206  . colchicine tablet 0.6 mg  0.6 mg Oral Daily PRN Algernon Huxley, MD   0.6 mg at 04/05/18 1218  . epoetin alfa (EPOGEN,PROCRIT) injection 4,000 Units  4,000 Units Intravenous  Q M,W,F-HD Algernon Huxley, MD   4,000 Units at 04/09/18 1538  . famotidine (PEPCID) tablet 40 mg  40 mg Oral PRN Stegmayer, Kimberly A, PA-C      . furosemide (LASIX) tablet 40 mg  40 mg Oral Daily Algernon Huxley, MD   40 mg at 04/10/18 0825  . gabapentin (NEURONTIN) capsule 300 mg  300 mg Oral QHS Algernon Huxley, MD   300 mg at 04/09/18 2056  . hydrALAZINE (APRESOLINE) injection 10 mg  10 mg Intravenous Once Algernon Huxley, MD      . HYDROcodone-acetaminophen (NORCO/VICODIN) 5-325 MG per tablet 1 tablet  1 tablet Oral Q4H PRN Algernon Huxley, MD   1 tablet at 04/08/18 1628  . Melatonin TABS 2.5 mg  2.5 mg Oral QPM Algernon Huxley, MD   2.5 mg at 04/09/18 2055  . methylPREDNISolone sodium succinate (SOLU-MEDROL) 125 mg/2 mL injection 125 mg  125 mg Intravenous PRN Stegmayer, Kimberly A, PA-C      . metoprolol succinate (TOPROL-XL) 24 hr tablet 25 mg  25 mg Oral QHS Algernon Huxley, MD   25 mg at 04/09/18 2056  . metroNIDAZOLE (FLAGYL) IVPB 500 mg  500 mg Intravenous Q8H Algernon Huxley, MD 95 mL/hr at 04/10/18 0825 500 mg at 04/10/18 0825  . multivitamin (RENA-VIT) tablet 1 tablet  1 tablet Oral QHS Algernon Huxley, MD   1 tablet at 04/09/18 2056  . ondansetron (ZOFRAN) tablet 4 mg  4 mg Oral Q6H PRN Algernon Huxley, MD   4 mg at 04/06/18 1159   Or  . ondansetron (ZOFRAN) injection 4 mg  4 mg Intravenous Q6H PRN Algernon Huxley, MD   4 mg at 04/07/18 0748  . ondansetron (ZOFRAN) injection 4 mg  4 mg Intravenous Q6H PRN Stegmayer, Kimberly A, PA-C      . senna-docusate (Senokot-S) tablet 1 tablet  1 tablet Oral QHS PRN Algernon Huxley, MD      . sodium chloride flush (NS) 0.9 % injection 3 mL  3 mL Intravenous Q12H Algernon Huxley, MD   3 mL at 04/10/18 0827  . vancomycin (VANCOCIN) IVPB 750 mg/150 ml premix  750 mg Intravenous Q M,W,F-HD Algernon Huxley, MD 150 mL/hr at 04/09/18 1740 750 mg at 04/09/18 1740     Discharge Medications: Please see discharge summary for a list of discharge medications.  Relevant Imaging  Results:  Relevant Lab Results:   Additional Information ss: [id number]  Shela Leff, LCSW

## 2018-04-10 NOTE — Evaluation (Signed)
Physical Therapy Evaluation Patient Details Name: Johnny [name]. MRN: [id number] DOB: 1950/12/27 Today's Date: 04/10/2018   History of Present Illness  Pt is a 67 y/o M who presented with lethargy, confusion, and increased tremors BUEs  suspected to be due to gabapentin toxicity.  Confusion since resolved. Plan is for RLE distal calf debridement on 12/4.  Pt s/p angioplasty RLE 12/2.  Pt s/p angiogram LLE on 04/02/18 and is scheduled for partial foot amputation in Dec per chart review.  Pt's PMH includes CHF, gout, dialysis pt, BLE ulcers.    Clinical Impression  Pt admitted with above diagnosis. Pt currently with functional limitations due to the deficits listed below (see PT Problem List). Mr. Oki mobility is limited by pain from BLE ulcers.  Due to this he requires assist with bed mobility, transfers, and ADLs.  Given pt's current mobility status, recommending SNF at d/c.   Pt will benefit from skilled PT to increase their independence and safety with mobility to allow discharge to the venue listed below.      Follow Up Recommendations SNF    Equipment Recommendations  None recommended by PT    Recommendations for Other Services       Precautions / Restrictions Precautions Precautions: Fall;Other (comment) Precaution Comments: Painful ulcers BLE Restrictions Weight Bearing Restrictions: No      Mobility  Bed Mobility Overal bed mobility: Needs Assistance Bed Mobility: Supine to Sit     Supine to sit: Min guard;HOB elevated     General bed mobility comments: HOB elevated and pt requires increased time and effort  Transfers Overall transfer level: Needs assistance Equipment used: Rolling walker (2 wheeled) Transfers: Sit to/from [facility] Sit to Stand: Min guard Stand pivot transfers: Min guard       General transfer comment: Cues for proper hand placement.  Pt maintains flexed posture and guarded posture with sit>stand and stand pivot  transfer.  Mild instability but no LOB.  Directional cues with stand pivot transfer.    Ambulation/Gait             General Gait Details: Pt declined  Stairs            Wheelchair Mobility    Modified Rankin (Stroke Patients Only)       Balance Overall balance assessment: Needs assistance Sitting-balance support: No upper extremity supported;Feet supported Sitting balance-Leahy Scale: Good     Standing balance support: Bilateral upper extremity supported;During functional activity Standing balance-Leahy Scale: Poor Standing balance comment: Pt relies on BUE support for static and dynamic activities                             Pertinent Vitals/Pain Pain Assessment: Faces Faces Pain Scale: Hurts a little bit Pain Location: BLE ulcers Pain Descriptors / Indicators: Discomfort Pain Intervention(s): Limited activity within patient's tolerance;Monitored during session;Repositioned    Home Living Family/patient expects to be discharged to:: Private residence Living Arrangements: Alone Available Help at Discharge: Friend(s);Available PRN/intermittently(friend lives at same complex) Type of Home: Apartment Home Access: Elevator     Home Layout: One level Home Equipment: Grab bars - tub/shower;Wheelchair - [employer] - 4 wheels;Cane - single point      Prior Function Level of Independence: Needs assistance   Gait / Transfers Assistance Needed: Pt uses WC and has not beed ambulating.  No falls in the past 6 months.   ADL's / Homemaking Assistance Needed: Pt ind with sponge bathing  and cooking but cooking was difficult as he had a hard time reaching stove from [facility].    Comments: Pt provides contradictory information regarding PLOF and home layout at different times during the evaluation     Hand Dominance        Extremity/Trunk Assessment   Upper Extremity Assessment Upper Extremity Assessment: Overall WFL for tasks assessed    Lower  Extremity Assessment Lower Extremity Assessment: (BLE ulcers and strength grossly 4-/5)    Cervical / Trunk Assessment Cervical / Trunk Assessment: Kyphotic  Communication   Communication: No difficulties  Cognition Arousal/Alertness: Awake/alert Behavior During Therapy: WFL for tasks assessed/performed Overall Cognitive Status: No family/caregiver present to determine baseline cognitive functioning                                 General Comments: A&O x4.  Pt appears to be answering all questions appropriately at start of session but pt provides contradictory information regarding PLOF and home layout at different times during the evaluation      General Comments      Exercises     Assessment/Plan    PT Assessment Patient needs continued PT services  PT Problem List Pain;Decreased strength;Decreased range of motion;Decreased activity tolerance;Decreased balance;Decreased knowledge of use of DME;Decreased safety awareness;Decreased skin integrity       PT Treatment Interventions DME instruction;Gait training;Functional mobility training;Therapeutic activities;Balance training;Therapeutic exercise;Neuromuscular re-education;Patient/family education;Modalities;Wheelchair mobility training    PT Goals (Current goals can be found in the Care Plan section)  Acute Rehab PT Goals Patient Stated Goal: to become more independent PT Goal Formulation: With patient Time For Goal Achievement: 04/24/18 Potential to Achieve Goals: Fair    Frequency Min 2X/week   Barriers to discharge Other (comment) Unsure of amount of assist availabe at d/c.  Likely intermittent    Co-evaluation               AM-PAC PT "6 Clicks" Mobility  Outcome Measure Help needed turning from your back to your side while in a flat bed without using bedrails?: A Little Help needed moving from lying on your back to sitting on the side of a flat bed without using bedrails?: A Little Help  needed moving to and from a bed to a chair (including a wheelchair)?: A Little Help needed standing up from a chair using your arms (e.g., wheelchair or bedside chair)?: A Little Help needed to walk in hospital room?: A Lot Help needed climbing 3-5 steps with a railing? : Total 6 Click Score: 15    End of Session Equipment Utilized During Treatment: Gait belt Activity Tolerance: Patient limited by pain;Patient tolerated treatment well Patient left: in chair;with call bell/phone within reach;with chair alarm set Nurse Communication: Mobility status PT Visit Diagnosis: Pain;Difficulty in walking, not elsewhere classified (R26.2);Other abnormalities of gait and mobility (R26.89);Unsteadiness on feet (R26.81) Pain - Right/Left: Right(and L) Pain - part of body: Leg    Time: 1132-1202 PT Time Calculation (min) (ACUTE ONLY): 30 min   Charges:   PT Evaluation $PT Eval Moderate Complexity: 1 Mod PT Treatments $Therapeutic Activity: 8-22 mins        Session was performed by student PT, Belva Crome, and directed, overseen, and documented by this PT.  Collie Siad PT, DPT 04/10/2018, 2:16 PM

## 2018-04-10 NOTE — Progress Notes (Signed)
Notified dr.diamond of pt refusing his blood draw. Acknowledged and no new orders.

## 2018-04-10 NOTE — Progress Notes (Signed)
[place], [place] 04/10/18  Subjective:  Patient seen at bedside. Completed dialysis yesterday. In good spirits today.  Objective:  Vital signs in last 24 hours:  Temp:  [97.6 F (36.4 C)-98.4 F (36.9 C)] 98.3 F (36.8 C) (12/03 1142) Pulse Rate:  [67-98] 71 (12/03 1142) Resp:  [11-26] 20 (12/03 1142) BP: (110-160)/(63-106) 136/64 (12/03 1142) SpO2:  [95 %-100 %] 98 % (12/03 1142) Weight:  [61.9 kg-63 kg] 61.9 kg (12/02 1912)  Weight change:  Filed Weights   04/06/18 1415 04/09/18 1508 04/09/18 1912  Weight: 64 kg 63 kg 61.9 kg    Intake/Output:    Intake/Output Summary (Last 24 hours) at 04/10/2018 1323 Last data filed at 04/10/2018 0414 Gross per 24 hour  Intake 823.62 ml  Output 1623 ml  Net -799.38 ml     Physical Exam: General:  No acute distress  HEENT  moist oral mucous membranes  Neck  supple  Pulm/lungs  normal breathing effort, clear  CVS/Heart  regular rhythm, 2/6 murmur  Abdomen:   Soft, nontender  Extremities:  No edema,  Rt leg bandage  Neurologic:  Alert, able to answer questions  Skin:  RLE wrapped  Access:  Right IJ PermCath       Basic Metabolic Panel:  Recent Labs  Lab 04/04/18 0534 04/05/18 0530 04/07/18 0539 04/09/18 0550 04/10/18 1043  NA 140 138 137 138 139  K 5.4* 4.3 3.6 3.8 3.7  CL 100 100 97* 101 103  CO2 24 26 30 27 26   GLUCOSE 82 68* 82 70 230*  BUN 59* 24* 15 28* 19  CREATININE 9.50* 5.44* 4.36* 7.46* 4.97*  CALCIUM 10.0 9.1 8.9 9.2 8.8*     CBC: Recent Labs  Lab 04/04/18 0534 04/05/18 0530 04/06/18 0342 04/07/18 0539 04/09/18 0550 04/10/18 1043  WBC 11.7* 12.4* 14.0* 13.3* 12.3* 20.9*  NEUTROABS 8.0*  --   --   --   --   --   HGB 10.1* 9.9* 10.1* 10.2* 10.1* 9.7*  HCT 32.8* 32.6* 33.1* 33.6* 33.0* 30.7*  MCV 101.2* 100.3* 100.6* 100.6* 100.6* 100.0  PLT 300 216 238 247 249 200      Lab Results  Component Value Date   HEPBSAG Negative 09/30/2015   HEPBSAB Reactive  01/29/2018      Microbiology:  Recent Results (from the past 240 hour(s))  Culture, blood (routine x 2)     Status: None   Collection Time: 04/04/18  5:34 AM  Result Value Ref Range Status   Specimen Description BLOOD BLOOD RIGHT HAND  Final   Special Requests   Final    BOTTLES DRAWN AEROBIC AND ANAEROBIC Blood Culture adequate volume   Culture   Final    NO GROWTH 5 DAYS Performed at [facility], [address] [place], [place] [address]    Report Status 04/09/2018 FINAL  Final  Culture, blood (routine x 2)     Status: None   Collection Time: 04/04/18  5:35 AM  Result Value Ref Range Status   Specimen Description BLOOD BLOOD LEFT HAND  Final   Special Requests   Final    BOTTLES DRAWN AEROBIC AND ANAEROBIC Blood Culture adequate volume   Culture   Final    NO GROWTH 5 DAYS Performed at [facility], [address] [place], [place] [address]    Report Status 04/09/2018 FINAL  Final  MRSA PCR Screening     Status: None   Collection Time: 04/04/18  6:22 PM  Result Value Ref Range Status   MRSA by PCR NEGATIVE NEGATIVE Final    Comment:        The GeneXpert MRSA Assay (FDA approved for NASAL specimens only), is one component of a comprehensive MRSA colonization surveillance program. It is not intended to diagnose MRSA infection nor to guide or monitor treatment for MRSA infections. Performed at [facility], [address] [place],  [address]     Coagulation Studies: No results for input(s): LABPROT, INR in the last 72 hours.  Urinalysis: No results for input(s): COLORURINE, LABSPEC, PHURINE, GLUCOSEU, HGBUR, BILIRUBINUR, KETONESUR, PROTEINUR, UROBILINOGEN, NITRITE, LEUKOCYTESUR in the last 72 hours.  Invalid input(s): APPERANCEUR    Imaging: No results found.   Medications:   . sodium chloride Stopped (04/05/18 1628)  . ceFEPime (MAXIPIME) IV Stopped (04/09/18 2128)  . metronidazole 500 mg (04/10/18 0825)  .  vancomycin 750 mg (04/09/18 1740)   . allopurinol  100 mg Oral Daily  . amLODipine  10 mg Oral QHS  . atorvastatin  10 mg Oral Daily  . calcium acetate  1,334 mg Oral TID WC  . Chlorhexidine Gluconate Cloth  6 each Topical Q0600  . epoetin (EPOGEN/PROCRIT) injection  4,000 Units Intravenous Q M,W,F-HD  . furosemide  40 mg Oral Daily  . gabapentin  300 mg Oral QHS  . hydrALAZINE  10 mg Intravenous Once  . Melatonin  2.5 mg Oral QPM  . metoprolol succinate  25 mg Oral QHS  . multivitamin  1 tablet Oral QHS  . sodium chloride flush  3 mL Intravenous Q12H     Assessment/ Plan:  67 y.o. African-American male with end stage renal disease on hemodialysis, congestive heart failure, hypertension, peripheral vascular disease  [place]. MWF L AVF 61kg  1.  End-stage renal disease with hyperkalemia 2.  Anemia of chronic kidney disease- EPO with HD 3.  Secondary hyperparathyroidism- on binders 4.  Altered mental status, improved. 5.  calf wound- vascular surgery evaluation in progress  Plan:  Patient completed hemodialysis yesterday and tolerated well.  We will plan for dialysis tomorrow if still here. Hemoglobin 9.7 at last check.  Continue Epogen 4000 units IV with dialysis treatments.  Recheck serum phosphorus tomorrow if still here.  Otherwise continue calcium acetate with meals as prescribed.   LOS: 6 Adrianah Prophete 12/3/20191:23 PM  [place], [place]  Note: This note was prepared with Dragon dictation. Any transcription errors are unintentional

## 2018-04-10 NOTE — Care Management (Signed)
Plan for debridement of wound on calf and wound vac placement on 12.4. At present, vascular has not stated if vac will be need at discharge.

## 2018-04-10 NOTE — Progress Notes (Signed)
[facility] Daily Progress Note   Communication Note  Subjective: 1 Day Post-Op: Ultrasound guidance for vascular access left femoral artery, Catheter placement into right SFA from left femoral approach, Selective right lower extremity angiogram, Percutaneous transluminal angioplasty of the right anterior tibial artery with a 4 mm diameter by 22 cm length Lutonix drug-coated angioplasty balloon, Percutaneous transluminal angioplasty of the right popliteal artery with a 6 mm diameter by 8 cm length Lutonix drug-coated angioplasty balloon with StarClose closure device left femoral artery  We will plan on debridement to the right lower extremity distal calf, heel and possible amputation of the fifth right digit on Wednesday 04/11/18 with Dr. Lucky Cowboy  Will pre-op.  Marcelle Overlie PA-C 04/10/2018 1:12 PM

## 2018-04-10 NOTE — Progress Notes (Addendum)
Initial Nutrition Assessment  DOCUMENTATION CODES:   Non-severe (moderate) malnutrition in context of chronic illness  INTERVENTION:  Liberalize diet   Ocuvite daily for wound healing (provides zinc, vitamin A, vitamin C, Vitamin E, copper, and selenium)  Nepro Shake po BID, each supplement provides 425 kcal and 19 grams protein  Vitamin C 500 mg BID  Renavite daily  NUTRITION DIAGNOSIS:   Moderate Malnutrition related to chronic illness(CHF and ESRD on HD) as evidenced by severe muscle depletion, moderate fat depletion.   GOAL:   Patient will meet greater than or equal to 90% of their needs   MONITOR:   PO intake, Supplement acceptance, Labs, Weight trends, Skin  REASON FOR ASSESSMENT:   Diagnosis    ASSESSMENT:   Pt with PMH: HTN, ESRD on HD, gout, CHF, anemia. Pt admitted with altered mental status and tremors.    Pt alert, oriented and in good spirits when DI entered the room.   Pt reports good appetitie eating all of breakfast and lunch and reports some nausea with certain foods but wasn't specific. Pt normally eats an egg, bacon and a tomato for breakfast, usually a burger for lunch and for dinner cooks himself vegetables and a pork chop/steak. Pt reports liking Nepro shakes and Ensures since he gets them at dialysis.  Pt stated dry weight was around 130#. Which is consistent with chart: 12/2 136#, and 11/25 134#.   Pt takes multivitamin each day along with binders.   Pt understood the importance of eating meals, protein and vitamins to promote wound healing.   I&D and wound vac placement scheduled for 12/4 for RLE calf.  Medications Reviewed and Include: Allipuronol, calcium acetate, epoetin, lasix, renavite, flagyl, vancomycin  Labs Reviewed:  Na 139 K 3.7 Cl 103 Bun 19 Creatinine 4.97 (H) Calcium 8.8 (L) Hemoglobin 9.7 (L)  HCT 30.7 (L)   NUTRITION - FOCUSED PHYSICAL EXAM:    Most Recent Value  Orbital Region  Moderate depletion  Upper Arm  Region  Moderate depletion  Thoracic and Lumbar Region  Mild depletion  Buccal Region  Mild depletion  Temple Region  Severe depletion  Clavicle Bone Region  Mild depletion  Clavicle and Acromion Bone Region  Mild depletion  Scapular Bone Region  Mild depletion  Dorsal Hand  Mild depletion  Patellar Region  Severe depletion  Anterior Thigh Region  Severe depletion  Posterior Calf Region  Severe depletion  Edema (RD Assessment)  Mild  Hair  Reviewed  Eyes  Reviewed  Mouth  Reviewed  Skin  Reviewed [cork screw hair]  Nails  Reviewed       Diet Order:   Diet Order            Diet NPO time specified Except for: Sips with Meds  Diet effective midnight        Diet regular Room service appropriate? Yes; Fluid consistency: Thin; Fluid restriction: 1200 mL Fluid  Diet effective now              EDUCATION NEEDS:   No education needs have been identified at this time  Skin:  Skin Assessment: Reviewed RN Assessment(stage 2 PI coccyx, wound RLE tibial and ankle s/p angioplasty)  Last BM:  12/1- Type 6  Height:   Ht Readings from Last 1 Encounters:  04/04/18 5\' 8"  (1.727 m)    Weight:   Wt Readings from Last 1 Encounters:  04/09/18 61.9 kg    Ideal Body Weight:  70 kg  BMI:  Body  mass index is 20.75 kg/m.  Estimated Nutritional Needs:   Kcal:  1800-2000  Protein:  85-100 g  Fluid:  1.2 L    Mauricia Area, MS, Dietetic Intern Pager: [phone] After hours Pager: [phone]

## 2018-04-10 NOTE — Progress Notes (Signed)
[facility] [place] NAME: Nehal Shives    MR#:  [id number]  DATE OF BIRTH:  May 01, 1951  SUBJECTIVE:  CHIEF COMPLAINT:   Chief Complaint  Patient presents with  . Tremors  . Altered Mental Status  Patient seen and evaluated today Had vascular intervention procedure yesterday Received 1 dose of IV Solu-Medrol yesterday and WBC count has jumped up Has foul-smelling right leg wound No fevers No confusion No complaints of any chest pain  REVIEW OF SYSTEMS:    ROS  CONSTITUTIONAL: No documented fever.  Decreased fatigue, weakness. No weight gain, no weight loss.  EYES: No blurry or double vision.  ENT: No tinnitus. No postnasal drip. No redness of the oropharynx.  RESPIRATORY: No cough, no wheeze, no hemoptysis. No dyspnea.  CARDIOVASCULAR: No chest pain. No orthopnea. No palpitations. No syncope.  GASTROINTESTINAL: No nausea, no vomiting or diarrhea. No abdominal pain. No melena or hematochezia.  GENITOURINARY: No dysuria or hematuria.  ENDOCRINE: No polyuria or nocturia. No heat or cold intolerance.  HEMATOLOGY: No anemia. No bruising. No bleeding.  INTEGUMENTARY: No rashes. No lesions.  MUSCULOSKELETAL: right leg wound (POA) NEUROLOGIC: No numbness, tingling, or ataxia. No seizure-type activity.  PSYCHIATRIC: No anxiety. No insomnia. No ADD.   DRUG ALLERGIES:   Allergies  Allergen Reactions  . Shellfish Allergy Anaphylaxis    VITALS:  Blood pressure 136/64, pulse 71, temperature 98.3 F (36.8 C), temperature source Oral, resp. rate 20, height 5\' 8"  (1.727 m), weight 61.9 kg, SpO2 98 %.  PHYSICAL EXAMINATION:   Physical Exam  GENERAL:  67 y.o.-year-old patient lying in the bed with no acute distress.  EYES: Pupils equal, round, reactive to light and accommodation. No scleral icterus. Extraocular muscles intact.  HEENT: Head atraumatic, normocephalic. Oropharynx and nasopharynx clear.  NECK:  Supple, no jugular venous  distention. No thyroid enlargement, no tenderness.  LUNGS: Normal breath sounds bilaterally, no wheezing, rales, rhonchi. No use of accessory muscles of respiration.  CARDIOVASCULAR: S1, S2 normal. No murmurs, rubs, or gallops.  ABDOMEN: Soft, nontender, nondistended. Bowel sounds present. No organomegaly or mass.  EXTREMITIES: No cyanosis, clubbing or edema b/l.    right leg bandage noted Wound present NEUROLOGIC: Cranial nerves II through XII are intact. No focal Motor or sensory deficits b/l.   PSYCHIATRIC: The patient is alert and oriented x 3.  SKIN: No obvious rash, lesion, or ulcer.   LABORATORY PANEL:   CBC Recent Labs  Lab 04/10/18 1043  WBC 20.9*  HGB 9.7*  HCT 30.7*  PLT 200   ------------------------------------------------------------------------------------------------------------------ Chemistries  Recent Labs  Lab 04/04/18 0534  04/10/18 1043  NA 140   < > 139  K 5.4*   < > 3.7  CL 100   < > 103  CO2 24   < > 26  GLUCOSE 82   < > 230*  BUN 59*   < > 19  CREATININE 9.50*   < > 4.97*  CALCIUM 10.0   < > 8.8*  AST 18  --   --   ALT 5  --   --   ALKPHOS 70  --   --   BILITOT 0.4  --   --    < > = values in this interval not displayed.   ------------------------------------------------------------------------------------------------------------------  Cardiac Enzymes Recent Labs  Lab 04/04/18 0830  TROPONINI 0.05*   ------------------------------------------------------------------------------------------------------------------  RADIOLOGY:  No results found.   ASSESSMENT AND PLAN:  67 year old male patient with history  of peripheral arterial disease, osteomyelitis of the left foot, recent angiogram and left common femoral artery and proximal SFA, hypertension, gout, chronic congestive heart failure, peripheral vascular disease currently under hospitalist service  -Leukocytosis Probably secondary to IV Solu-Medrol given yesterday As bad wound  infection in the right leg Continue IV vancomycin and IV cefepime and Flagyl antibiotics for now  -Acute encephalopathy resolved Probably secondary to gabapentin toxicity Decreased dose and resume gabapentin 300 mg nightly and patient better  -Right leg cellulitis and wound infection with poor circulation Surgical debridement of right leg wound tomorrow by vascular surgery  -Systemic inflammatory response syndrome secondary to above improving Continue IV cefepime and IV Flagyl antibiotics MRSA PCR negative Cultures blood negative continue IV vancomycin in view of bad right leg wound, cellulitis and local infection during dialysis  -Right leg wound nonhealing Wound care consultation appreciated Angiogram done and vascular surgery intervention done yesterday Will hold Eliquis, aspirin for debridement procedure tomorrow  -Left fourth toe dry ulcer Is post wound care evaluation and vascular surgery evaluation  -Mild hyperkalemia improved after dialysis   -End-stage renal disease Continue dialysis as per nephrology   -DVT prophylaxis Eliquis on hold for procedure tomorrow morning  -Peripheral vascular disease Continue aspirin and statin  -Mildly elevated troponin probably secondary to demand ischemia  -Disposition Once his left lower extremity circulation has been taken care of by vascular surgery and patient can be discharged home with home health services  All the records are reviewed and case discussed with Care Management/Social Worker. Management plans discussed with the patient, family and they are in agreement.  CODE STATUS: Full code  DVT Prophylaxis: SCDs  TOTAL TIME TAKING CARE OF THIS PATIENT: 34 minutes.   POSSIBLE D/C IN 2 to 3 DAYS, DEPENDING ON CLINICAL CONDITION.  Saundra Shelling M.D on 04/10/2018 at 12:53 PM  Between 7am to 6pm - Pager - [phone]  After 6pm go to [email or web address] - password [email or web address] [facility]  Office   [phone]  CC: Primary care physician; [name], MD  Note: This dictation was prepared with Dragon dictation along with smaller phrase technology. Any transcriptional errors that result from this process are unintentional.

## 2018-04-11 ENCOUNTER — Encounter: Payer: Self-pay | Admitting: *Deleted

## 2018-04-11 ENCOUNTER — Inpatient Hospital Stay: Payer: Medicare Other | Admitting: Anesthesiology

## 2018-04-11 ENCOUNTER — Inpatient Hospital Stay: Admit: 2018-04-11 | Payer: Medicare Other | Admitting: Vascular Surgery

## 2018-04-11 ENCOUNTER — Encounter
Admission: EM | Disposition: A | Discharge status: Skilled Nursing Facility | Payer: Self-pay | Source: Home / Self Care | Attending: Internal Medicine

## 2018-04-11 DIAGNOSIS — L97213 Non-pressure chronic ulcer of right calf with necrosis of muscle: Secondary | ICD-10-CM

## 2018-04-11 DIAGNOSIS — E440 Moderate protein-calorie malnutrition: Secondary | ICD-10-CM

## 2018-04-11 DIAGNOSIS — L97513 Non-pressure chronic ulcer of other part of right foot with necrosis of muscle: Secondary | ICD-10-CM

## 2018-04-11 DIAGNOSIS — L97313 Non-pressure chronic ulcer of right ankle with necrosis of muscle: Secondary | ICD-10-CM

## 2018-04-11 DIAGNOSIS — L97913 Non-pressure chronic ulcer of unspecified part of right lower leg with necrosis of muscle: Secondary | ICD-10-CM

## 2018-04-11 HISTORY — PX: APPLICATION OF WOUND VAC: SHX5189

## 2018-04-11 HISTORY — PX: WOUND DEBRIDEMENT: SHX247

## 2018-04-11 LAB — BASIC METABOLIC PANEL
Anion gap: 7 (ref 5–15)
BUN: 27 mg/dL — AB (ref 8–23)
CO2: 30 mmol/L (ref 22–32)
Calcium: 9.3 mg/dL (ref 8.9–10.3)
Chloride: 105 mmol/L (ref 98–111)
Creatinine, Ser: 5.74 mg/dL — ABNORMAL HIGH (ref 0.61–1.24)
GFR calc Af Amer: 11 mL/min — ABNORMAL LOW (ref >60)
GFR calc non Af Amer: 9 mL/min — ABNORMAL LOW (ref >60)
Glucose, Bld: 87 mg/dL (ref 70–99)
Potassium: 3.8 mmol/L (ref 3.5–5.1)
Sodium: 142 mmol/L (ref 135–145)

## 2018-04-11 LAB — CBC
HCT: 29.7 % — ABNORMAL LOW (ref 39.0–52.0)
Hemoglobin: 9.2 g/dL — ABNORMAL LOW (ref 13.0–17.0)
MCH: 31.1 pg (ref 26.0–34.0)
MCHC: 31 g/dL (ref 30.0–36.0)
MCV: 100.3 fL — ABNORMAL HIGH (ref 80.0–100.0)
Platelets: 192 10*3/uL (ref 150–400)
RBC: 2.96 MIL/uL — ABNORMAL LOW (ref 4.22–5.81)
RDW: 16.4 % — ABNORMAL HIGH (ref 11.5–15.5)
WBC: 15.7 10*3/uL — ABNORMAL HIGH (ref 4.0–10.5)
nRBC: 0 % (ref 0.0–0.2)

## 2018-04-11 LAB — TYPE AND SCREEN
ABO/RH(D): B NEG
Antibody Screen: NEGATIVE

## 2018-04-11 LAB — MAGNESIUM: Magnesium: 1.7 mg/dL (ref 1.7–2.4)

## 2018-04-11 LAB — PHOSPHORUS: Phosphorus: 2.9 mg/dL (ref 2.5–4.6)

## 2018-04-11 LAB — APTT: aPTT: 37 seconds — ABNORMAL HIGH (ref 24–36)

## 2018-04-11 LAB — PROTIME-INR
INR: 1.26
Prothrombin Time: 15.7 seconds — ABNORMAL HIGH (ref 11.4–15.2)

## 2018-04-11 LAB — VANCOMYCIN, RANDOM: Vancomycin Rm: 18

## 2018-04-11 SURGERY — DEBRIDEMENT, WOUND
Anesthesia: General | Laterality: Right

## 2018-04-11 MED ORDER — FENTANYL CITRATE (PF) 100 MCG/2ML IJ SOLN
INTRAMUSCULAR | Status: DC | PRN
  Administered 2018-04-11 (×4): 25 ug via INTRAVENOUS

## 2018-04-11 MED ORDER — ACETAMINOPHEN 325 MG PO TABS: 325.0000 mg | ORAL_TABLET | ORAL | Status: DC | PRN

## 2018-04-11 MED ORDER — CEFAZOLIN SODIUM-DEXTROSE 2-4 GM/100ML-% IV SOLN
INTRAVENOUS | Status: AC
  Filled 2018-04-11: qty 100

## 2018-04-11 MED ORDER — PROPOFOL 500 MG/50ML IV EMUL
INTRAVENOUS | Status: DC | PRN
  Administered 2018-04-11: 100 ug/kg/min via INTRAVENOUS

## 2018-04-11 MED ORDER — FENTANYL CITRATE (PF) 100 MCG/2ML IJ SOLN
INTRAMUSCULAR | Status: AC
  Administered 2018-04-11: 50 ug via INTRAVENOUS
  Filled 2018-04-11: qty 2

## 2018-04-11 MED ORDER — CEFAZOLIN SODIUM-DEXTROSE 1-4 GM/50ML-% IV SOLN
INTRAVENOUS | Status: DC | PRN
  Administered 2018-04-11: 2 g via INTRAVENOUS

## 2018-04-11 MED ORDER — PROPOFOL 10 MG/ML IV BOLUS
INTRAVENOUS | Status: AC
  Filled 2018-04-11: qty 20

## 2018-04-11 MED ORDER — FENTANYL CITRATE (PF) 100 MCG/2ML IJ SOLN
25.0000 ug | INTRAMUSCULAR | Status: DC | PRN
  Administered 2018-04-11 (×2): 25 ug via INTRAVENOUS
  Administered 2018-04-11 (×2): 50 ug via INTRAVENOUS

## 2018-04-11 MED ORDER — FENTANYL CITRATE (PF) 100 MCG/2ML IJ SOLN
INTRAMUSCULAR | Status: AC
  Filled 2018-04-11: qty 2

## 2018-04-11 MED ORDER — PROMETHAZINE HCL 25 MG/ML IJ SOLN: 6.2500 mg | INTRAMUSCULAR | Status: DC | PRN

## 2018-04-11 MED ORDER — PROPOFOL 10 MG/ML IV BOLUS
INTRAVENOUS | Status: DC | PRN
  Administered 2018-04-11 (×2): 50 mg via INTRAVENOUS

## 2018-04-11 MED ORDER — HYDROCODONE-ACETAMINOPHEN 5-325 MG PO TABS
2.0000 | ORAL_TABLET | Freq: Four times a day (QID) | ORAL | Status: DC | PRN
  Administered 2018-04-11: 2 via ORAL
  Filled 2018-04-11 (×2): qty 2

## 2018-04-11 MED ORDER — ACETAMINOPHEN 160 MG/5ML PO SOLN
325.0000 mg | ORAL | Status: DC | PRN
  Filled 2018-04-11: qty 20.3

## 2018-04-11 MED ORDER — SODIUM CHLORIDE 0.9 % IV SOLN: INTRAVENOUS | Status: DC

## 2018-04-11 MED ORDER — FENTANYL CITRATE (PF) 100 MCG/2ML IJ SOLN
INTRAMUSCULAR | Status: AC
  Administered 2018-04-11: 25 ug via INTRAVENOUS
  Filled 2018-04-11: qty 2

## 2018-04-11 SURGICAL SUPPLY — 21 items
CANISTER SUCT 1200ML W/VALVE (MISCELLANEOUS) ×2 IMPLANT
COVER WAND RF STERILE (DRAPES) ×2 IMPLANT
DRAPE INCISE IOBAN 66X45 STRL (DRAPES) ×2 IMPLANT
DRAPE LAPAROTOMY 100X77 ABD (DRAPES) ×2 IMPLANT
ELECT REM PT RETURN 9FT ADLT (ELECTROSURGICAL) ×2
ELECTRODE REM PT RTRN 9FT ADLT (ELECTROSURGICAL) ×1 IMPLANT
GLOVE BIO SURGEON STRL SZ7 (GLOVE) ×2 IMPLANT
GOWN STRL REUS W/ TWL LRG LVL3 (GOWN DISPOSABLE) ×1 IMPLANT
GOWN STRL REUS W/ TWL XL LVL3 (GOWN DISPOSABLE) ×1 IMPLANT
GOWN STRL REUS W/TWL LRG LVL3 (GOWN DISPOSABLE) ×1
GOWN STRL REUS W/TWL XL LVL3 (GOWN DISPOSABLE) ×1
HANDLE YANKAUER SUCT BULB TIP (MISCELLANEOUS) ×1 IMPLANT
KIT DRSG VAC SLVR GRANUFM (MISCELLANEOUS) ×1 IMPLANT
KIT TURNOVER KIT A (KITS) ×2 IMPLANT
NS IRRIG 1000ML POUR BTL (IV SOLUTION) ×2 IMPLANT
PACK BASIN MINOR ARMC (MISCELLANEOUS) ×2 IMPLANT
SOL PREP PVP 2OZ (MISCELLANEOUS) ×2
SOLUTION PREP PVP 2OZ (MISCELLANEOUS) ×1 IMPLANT
SPONGE LAP 18X18 RF (DISPOSABLE) ×2 IMPLANT
STOCKINETTE STRL 6IN 960660 (GAUZE/BANDAGES/DRESSINGS) ×1 IMPLANT
WND VAC CANISTER 500ML (MISCELLANEOUS) ×1 IMPLANT

## 2018-04-11 NOTE — Anesthesia Preprocedure Evaluation (Signed)
Anesthesia Evaluation  Patient identified by MRN, date of birth, ID band Patient awake    Reviewed: Allergy & Precautions, H&P , NPO status , reviewed documented beta blocker date and time   Airway Mallampati: II  TM Distance: >3 FB Neck ROM: full    Dental  (+) Upper Dentures, Lower Dentures   Pulmonary former smoker,    Pulmonary exam normal        Cardiovascular hypertension, + Peripheral Vascular Disease and +CHF  Normal cardiovascular exam     Neuro/Psych PSYCHIATRIC DISORDERS Anxiety    GI/Hepatic neg GERD  ,  Endo/Other  diabetes  Renal/GU DialysisRenal diseaseDialyzed Mon, will be dialyzed after OR     Musculoskeletal   Abdominal   Peds  Hematology  (+) Blood dyscrasia, anemia ,   Anesthesia Other Findings Past Medical History: No date: Anemia No date: Anxiety No date: CHF (congestive heart failure) (HCC) No date: Chronic kidney disease No date: Gout No date: Hyperlipidemia No date: Hypertension No date: Peripheral vascular disease [facility])  Past Surgical History: 06/21/2017: A/V SHUNTOGRAM; Left     Comment:  Procedure: A/V SHUNTOGRAM;  Surgeon: Katha Cabal,              MD;  Location: [place] [facility];  Service:               Cardiovascular;  Laterality: Left; 09/18/2015: AV FISTULA PLACEMENT; Left     Comment:  Procedure: INSERTION OF ARTERIOVENOUS (AV) GORE-TEX               GRAFT ARM ( BRACH/AXILLARY GRAFT W/ INSTANT STICK GRAFT               );  Surgeon: Katha Cabal, MD;  Location: [facility] ORS;               Service: Vascular;  Laterality: Left; No date: DIALYSIS FISTULA CREATION 12/19/2017: ESOPHAGOGASTRODUODENOSCOPY; N/A     Comment:  Procedure: ESOPHAGOGASTRODUODENOSCOPY (EGD);  Surgeon:               Lin Landsman, MD;  Location: [facility] ENDOSCOPY;                Service: Gastroenterology;  Laterality: N/A; 11/16/2017: LOWER EXTREMITY ANGIOGRAPHY; Left     Comment:   Procedure: LOWER EXTREMITY ANGIOGRAPHY;  Surgeon: Algernon Huxley, MD;  Location: [place] [facility];  Service:               Cardiovascular;  Laterality: Left; 01/18/2018: LOWER EXTREMITY ANGIOGRAPHY; Right     Comment:  Procedure: LOWER EXTREMITY ANGIOGRAPHY;  Surgeon: Algernon Huxley, MD;  Location: [place] [facility];  Service:               Cardiovascular;  Laterality: Right; 04/02/2018: LOWER EXTREMITY ANGIOGRAPHY; Left     Comment:  Procedure: LOWER EXTREMITY ANGIOGRAPHY;  Surgeon: Algernon Huxley, MD;  Location: [place] [facility];  Service:               Cardiovascular;  Laterality: Left; 04/09/2018: LOWER EXTREMITY ANGIOGRAPHY; Right     Comment:  Procedure: Lower Extremity Angiography with possible               intervention;  Surgeon: Algernon Huxley, MD;  Location: [place] [facility];  Service: Cardiovascular;  Laterality:               Right; 09/01/2015: PERIPHERAL VASCULAR CATHETERIZATION; Left     Comment:  Procedure: A/V Shuntogram/Fistulagram;  Surgeon: Katha Cabal, MD;  Location: [place] [facility];  Service:              Cardiovascular;  Laterality: Left; 09/30/2015: PERIPHERAL VASCULAR CATHETERIZATION; N/A     Comment:  Procedure: A/V Shuntogram/Fistulagram with perm               cathether removal;  Surgeon: Algernon Huxley, MD;  Location:               [place] [facility];  Service: Cardiovascular;                Laterality: N/A; 09/30/2015: PERIPHERAL VASCULAR CATHETERIZATION; Left     Comment:  Procedure: A/V Shunt Intervention;  Surgeon: Algernon Huxley, MD;  Location: [place] [facility];  Service:               Cardiovascular;  Laterality: Left; 12/03/2015: PERIPHERAL VASCULAR CATHETERIZATION; Left     Comment:  Procedure: Thrombectomy;  Surgeon: Algernon Huxley, MD;                Location: [place] [facility];  Service: Cardiovascular;              Laterality: Left; 01/28/2016:  PERIPHERAL VASCULAR CATHETERIZATION; Left     Comment:  Procedure: Thrombectomy;  Surgeon: Algernon Huxley, MD;                Location: [place] [facility];  Service: Cardiovascular;              Laterality: Left; 01/28/2016: PERIPHERAL VASCULAR CATHETERIZATION; N/A     Comment:  Procedure: A/V Shuntogram/Fistulagram;  Surgeon: Algernon Huxley, MD;  Location: [place] [facility];  Service:               Cardiovascular;  Laterality: N/A;  BMI    Body Mass Index:  20.75 kg/m      Reproductive/Obstetrics                             Anesthesia Physical Anesthesia Plan  ASA: IV  Anesthesia Plan: General   Post-op Pain Management:    Induction: Intravenous  PONV Risk Score and Plan: Treatment may vary due to age or medical condition and TIVA  Airway Management Planned: Nasal Cannula and Natural Airway  Additional Equipment:   Intra-op Plan:   Post-operative Plan:   Informed Consent: I have reviewed the patients History and Physical, chart, labs and discussed the procedure including the risks, benefits and alternatives for the proposed anesthesia with the patient or authorized representative who has indicated his/her understanding and acceptance.   Dental Advisory Given  Plan Discussed with: CRNA  Anesthesia Plan Comments:         Anesthesia Quick Evaluation

## 2018-04-11 NOTE — Progress Notes (Signed)
PT Cancellation Note  Patient Details Name: Johnny [name]. MRN: [id number] DOB: 12-15-1950   Cancelled Treatment:    Reason Eval/Treat Not Completed: Patient at procedure or test/unavailable(currently off floor for R calf debridement).  Will continue to follow pt acutely.    Collie Siad PT, DPT 04/11/2018, 2:21 PM

## 2018-04-11 NOTE — H&P (Signed)
[facility] History & Physical Update  The patient was interviewed and re-examined.  The patient's previous History and Physical has been reviewed and is unchanged.  There is no change in the plan of care. We plan to proceed with the scheduled procedure.  Leotis Pain, MD  04/11/2018, 1:10 PM

## 2018-04-11 NOTE — Clinical Social Work Note (Signed)
Clinical Social Work Assessment  Patient Details  Name: Johnny [name]. MRN: [id number] Date of Birth: 05-08-1951  Date of referral:  04/11/18               Reason for consult:  Facility Placement                Permission sought to share information with:    Permission granted to share information::     Name::        Agency::     Relationship::     Contact Information:     Housing/Transportation Living arrangements for the past 2 months:  [place] of Information:  Power of Attorney Patient Interpreter Needed:  None Criminal Activity/Legal Involvement Pertinent to Current Situation/Hospitalization:  No - Comment as needed Significant Relationships:  Friend Lives with:  Self Do you feel safe going back to the place where you live?    Need for family participation in patient care:     Care giving concerns:  Patient is unable to care for himself at this time and has been living alone.    Social Worker assessment / plan:  CSW spoke with patient's power of attorney: Johnny [name]: [phone] this morning as she was waiting for patient in the surgical waiting room. CSW extended the bed offers to her. CSW explained that [facility] was no longer able to offer due to patient consistently coming for rehab and then discharging. They believe he should be long term care but patient never stays. Mrs. Johnny [name] stated she understood. She has chosen [facility] for patient. Johnny [name] at [facility] is beginning prior [place] with [facility].   Employment status:    Insurance informationEducational psychologist PT Recommendations:  [place] / Referral to community resources:     Patient/Family's Response to care:  Mrs. Johnny [name] expressed appreciation for CSW assistance.  Patient/Family's Understanding of and Emotional Response to Diagnosis, Current Treatment, and Prognosis:  Mrs. Johnny [name] states she is very pleased with the care patient has been  receiving and she is hopeful that patient will improve after his procedure.  Emotional Assessment Appearance:  Appears stated age Attitude/Demeanor/Rapport:  (pleasantly confused) Affect (typically observed):    Orientation:  Oriented to Self Alcohol / Substance use:  Not Applicable Psych involvement (Current and /or in the community):  No (Comment)  Discharge Needs  Concerns to be addressed:  Care Coordination Readmission within the last 30 days:  No Current discharge risk:  None Barriers to Discharge:  No Barriers Identified   Johnny Leff, LCSW 04/11/2018, 12:14 PM

## 2018-04-11 NOTE — Progress Notes (Signed)
Pre HD Assessment    04/11/18 1650  Neurological  Level of Consciousness Alert  Orientation Level Oriented X4  Respiratory  Respiratory Pattern Regular  Chest Assessment Chest expansion symmetrical  Bilateral Breath Sounds Clear;Diminished  Cardiac  Pulse Regular  Heart Sounds S1, S2  Jugular Venous Distention (JVD) No  ECG Monitor Yes  Cardiac Rhythm NSR  Vascular  R Radial Pulse +2  L Radial Pulse +2  R Dorsalis Pedis Pulse  (Unable to assess)  L Dorsalis Pedis Pulse Other (Comment) (Unable to assess)  Edema Generalized  Generalized Edema None  Integumentary  Integumentary (WDL) X  Skin Color Appropriate for ethnicity  Skin Condition Dry;Flaky  Skin Integrity Surgical Incision (see LDA)  Musculoskeletal  Musculoskeletal (WDL) X  Generalized Weakness Yes  GU Assessment  Genitourinary (WDL) X (HD pt)  Psychosocial  Psychosocial (WDL) WDL

## 2018-04-11 NOTE — Plan of Care (Signed)

## 2018-04-11 NOTE — Progress Notes (Signed)
HD Treatment Initiated    04/11/18 1654  Vital Signs  Pulse Rate 74  Resp 18  Oxygen Therapy  SpO2 100 %  During Hemodialysis Assessment  Blood Flow Rate (mL/min) 400 mL/min  Arterial Pressure (mmHg) -130 mmHg  Venous Pressure (mmHg) 140 mmHg  Transmembrane Pressure (mmHg) 50 mmHg  Ultrafiltration Rate (mL/min) 800 mL/min  Dialysate Flow Rate (mL/min) 800 ml/min  Conductivity: Machine  13.9  HD Safety Checks Performed Yes  Dialysis Fluid Bolus Normal Saline  Bolus Amount (mL) 250 mL  Intra-Hemodialysis Comments Tx initiated;Progressing as prescribed  Hemodialysis Catheter Right Subclavian Double-lumen  Placement Date/Time: (c) (c)   Placed prior to admission: No  Orientation: Right  Access Location: Subclavian  Hemodialysis Catheter Type: Double-lumen  Blue Lumen Status Infusing  Red Lumen Status Infusing

## 2018-04-11 NOTE — Progress Notes (Addendum)
[name] at [place] NAME: Johnny [name]    MR#:  [id number]  DATE OF BIRTH:  [birth year]  SUBJECTIVE:  CHIEF COMPLAINT:   Chief Complaint  Patient presents with  . Tremors  . Altered Mental Status  Patient seen and evaluated today Due for wound debridement in the right calf today by vascular surgery Leukocytosis improved Has foul-smelling right leg wound No fevers No confusion No complaints of any chest pain  REVIEW OF SYSTEMS:    ROS  CONSTITUTIONAL: No documented fever.  Decreased fatigue, weakness. No weight gain, no weight loss.  EYES: No blurry or double vision.  ENT: No tinnitus. No postnasal drip. No redness of the oropharynx.  RESPIRATORY: No cough, no wheeze, no hemoptysis. No dyspnea.  CARDIOVASCULAR: No chest pain. No orthopnea. No palpitations. No syncope.  GASTROINTESTINAL: No nausea, no vomiting or diarrhea. No abdominal pain. No melena or hematochezia.  GENITOURINARY: No dysuria or hematuria.  ENDOCRINE: No polyuria or nocturia. No heat or cold intolerance.  HEMATOLOGY: No anemia. No bruising. No bleeding.  INTEGUMENTARY: No rashes. No lesions.  MUSCULOSKELETAL: right leg wound (POA) NEUROLOGIC: No numbness, tingling, or ataxia. No seizure-type activity.  PSYCHIATRIC: No anxiety. No insomnia. No ADD.   DRUG ALLERGIES:   Allergies  Allergen Reactions  . Shellfish Allergy Anaphylaxis    VITALS:  Blood pressure 127/70, pulse 66, temperature (!) 97 F (36.1 C), temperature source Tympanic, resp. rate 16, height 5\' 8"  (1.727 m), weight 61.9 kg, SpO2 100 %.  PHYSICAL EXAMINATION:   Physical Exam  GENERAL:  67 y.o.-year-old patient lying in the bed with no acute distress.  EYES: Pupils equal, round, reactive to light and accommodation. No scleral icterus. Extraocular muscles intact.  HEENT: Head atraumatic, normocephalic. Oropharynx and nasopharynx clear.  NECK:  Supple, no jugular venous distention. No thyroid  enlargement, no tenderness.  LUNGS: Normal breath sounds bilaterally, no wheezing, rales, rhonchi. No use of accessory muscles of respiration.  CARDIOVASCULAR: S1, S2 normal. No murmurs, rubs, or gallops.  ABDOMEN: Soft, nontender, nondistended. Bowel sounds present. No organomegaly or mass.  EXTREMITIES: No cyanosis, clubbing or edema b/l.    right leg bandage noted Right leg wound 17 / 16 cm with necrotic slough and discharge NEUROLOGIC: Cranial nerves II through XII are intact. No focal Motor or sensory deficits b/l.   PSYCHIATRIC: The patient is alert and oriented x 3.  SKIN: No obvious rash, lesion, or ulcer.   LABORATORY PANEL:   CBC Recent Labs  Lab 04/11/18 0514  WBC 15.7*  HGB 9.2*  HCT 29.7*  PLT 192   ------------------------------------------------------------------------------------------------------------------ Chemistries  Recent Labs  Lab 04/11/18 0514  NA 142  K 3.8  CL 105  CO2 30  GLUCOSE 87  BUN 27*  CREATININE 5.74*  CALCIUM 9.3  MG 1.7   ------------------------------------------------------------------------------------------------------------------  Cardiac Enzymes No results for input(s): TROPONINI in the last 168 hours. ------------------------------------------------------------------------------------------------------------------  RADIOLOGY:  No results found.   ASSESSMENT AND PLAN:  67 year old male patient with history of peripheral arterial disease, osteomyelitis of the left foot, recent angiogram and left common femoral artery and proximal SFA, hypertension, gout, chronic congestive heart failure, peripheral vascular disease currently under hospitalist service  -Complicated right leg cellulitis and complicated wound infection with poor circulation Surgical debridement of right leg wound today by vascular surgery  -Leukocytosis improved As well as bad wound infection in the right leg Continue IV vancomycin and IV cefepime and  Flagyl antibiotics for now  -Acute  encephalopathy resolved Probably secondary to gabapentin toxicity Decreased dose and resume gabapentin 300 mg nightly and patient better  -Systemic inflammatory response syndrome secondary to above improved Continue IV cefepime and IV Flagyl antibiotics MRSA PCR negative Cultures blood negative continue IV vancomycin in view of bad right leg wound, cellulitis and local infection during dialysis  -Right leg wound nonhealing Wound care consultation appreciated Angiogram done and vascular surgery intervention done yesterday On hold Eliquis, aspirin for debridement procedure today  -Left fourth toe dry ulcer Is post wound care evaluation and vascular surgery evaluation  -Mild hyperkalemia improved after dialysis   -End-stage renal disease Continue dialysis as per nephrology   -DVT prophylaxis Eliquis on hold for procedure tomorrow morning  -Peripheral vascular disease Continue aspirin and statin  -Mildly elevated troponin probably secondary to demand ischemia  -Disposition Probably tomorrow with home health services if no more procedures being planned by vascular surgery  All the records are reviewed and case discussed with Care Management/Social Worker. Management plans discussed with the patient, family and they are in agreement.  CODE STATUS: Full code  DVT Prophylaxis: SCDs  TOTAL TIME TAKING CARE OF THIS PATIENT: 32 minutes.   POSSIBLE D/C IN 2 to 3 DAYS, DEPENDING ON CLINICAL CONDITION.  Saundra Shelling M.D on 04/11/2018 at 2:04 PM  Between 7am to 6pm - Pager - [phone]  After 6pm go to [email or web address] - password [email or web address] [facility]  Office  [phone]  CC: Primary care physician; Johnny Sia, MD  Note: This dictation was prepared with Dragon dictation along with smaller phrase technology. Any transcriptional errors that result from this process are unintentional.

## 2018-04-11 NOTE — Progress Notes (Signed)
[place], [place] 04/11/18  Subjective:  Patient seen at bedside. Due for right lower extremity distal calf wound debridement today. She is also due for dialysis today.  Objective:  Vital signs in last 24 hours:  Temp:  [98.3 F (36.8 C)-98.4 F (36.9 C)] 98.3 F (36.8 C) (12/04 0539) Pulse Rate:  [66-87] 66 (12/04 0539) Resp:  [14-20] 14 (12/04 0539) BP: (112-136)/(64-82) 132/73 (12/04 0539) SpO2:  [98 %-100 %] 100 % (12/04 0539)  Weight change:  Filed Weights   04/06/18 1415 04/09/18 1508 04/09/18 1912  Weight: 64 kg 63 kg 61.9 kg    Intake/Output:    Intake/Output Summary (Last 24 hours) at 04/11/2018 1043 Last data filed at 04/11/2018 5329 Gross per 24 hour  Intake 649.36 ml  Output -  Net 649.36 ml     Physical Exam: General:  No acute distress  HEENT  moist oral mucous membranes  Neck  supple  Pulm/lungs  normal breathing effort, clear  CVS/Heart  regular rhythm, 2/6 murmur  Abdomen:   Soft, nontender  Extremities:  No edema,  Rt leg bandage  Neurologic:  Alert, able to answer questions  Skin:  RLE wrapped  Access:  Right IJ PermCath       Basic Metabolic Panel:  Recent Labs  Lab 04/05/18 0530 04/07/18 0539 04/09/18 0550 04/10/18 1043 04/11/18 0514  NA 138 137 138 139 142  K 4.3 3.6 3.8 3.7 3.8  CL 100 97* 101 103 105  CO2 26 30 27 26 30   GLUCOSE 68* 82 70 230* 87  BUN 24* 15 28* 19 27*  CREATININE 5.44* 4.36* 7.46* 4.97* 5.74*  CALCIUM 9.1 8.9 9.2 8.8* 9.3  MG  --   --   --   --  1.7     CBC: Recent Labs  Lab 04/06/18 0342 04/07/18 0539 04/09/18 0550 04/10/18 1043 04/11/18 0514  WBC 14.0* 13.3* 12.3* 20.9* 15.7*  HGB 10.1* 10.2* 10.1* 9.7* 9.2*  HCT 33.1* 33.6* 33.0* 30.7* 29.7*  MCV 100.6* 100.6* 100.6* 100.0 100.3*  PLT 238 247 249 200 192      Lab Results  Component Value Date   HEPBSAG Negative 09/30/2015   HEPBSAB Reactive 01/29/2018      Microbiology:  Recent Results (from the  past 240 hour(s))  Culture, blood (routine x 2)     Status: None   Collection Time: 04/04/18  5:34 AM  Result Value Ref Range Status   Specimen Description BLOOD BLOOD RIGHT HAND  Final   Special Requests   Final    BOTTLES DRAWN AEROBIC AND ANAEROBIC Blood Culture adequate volume   Culture   Final    NO GROWTH 5 DAYS Performed at [facility], [address] [place], [place] [address]    Report Status 04/09/2018 FINAL  Final  Culture, blood (routine x 2)     Status: None   Collection Time: 04/04/18  5:35 AM  Result Value Ref Range Status   Specimen Description BLOOD BLOOD LEFT HAND  Final   Special Requests   Final    BOTTLES DRAWN AEROBIC AND ANAEROBIC Blood Culture adequate volume   Culture   Final    NO GROWTH 5 DAYS Performed at [facility], [address], [place], [place] [address]    Report Status 04/09/2018 FINAL  Final  MRSA PCR Screening     Status: None   Collection Time: 04/04/18  6:22 PM  Result Value Ref Range Status  MRSA by PCR NEGATIVE NEGATIVE Final    Comment:        The GeneXpert MRSA Assay (FDA approved for NASAL specimens only), is one component of a comprehensive MRSA colonization surveillance program. It is not intended to diagnose MRSA infection nor to guide or monitor treatment for MRSA infections. Performed at [facility], [address] [place], [place] [address]     Coagulation Studies: Recent Labs    04/11/18 0514  LABPROT 15.7*  INR 1.26    Urinalysis: No results for input(s): COLORURINE, LABSPEC, PHURINE, GLUCOSEU, HGBUR, BILIRUBINUR, KETONESUR, PROTEINUR, UROBILINOGEN, NITRITE, LEUKOCYTESUR in the last 72 hours.  Invalid input(s): APPERANCEUR    Imaging: No results found.   Medications:   . sodium chloride Stopped (04/10/18 1910)  . ceFEPime (MAXIPIME) IV Stopped (04/10/18 1639)  . metronidazole 500 mg (04/11/18 0937)  . vancomycin 750 mg (04/09/18 1740)   . allopurinol  100 mg Oral  Daily  . amLODipine  10 mg Oral QHS  . atorvastatin  10 mg Oral Daily  . calcium acetate  1,334 mg Oral TID WC  . Chlorhexidine Gluconate Cloth  6 each Topical Q0600  . epoetin (EPOGEN/PROCRIT) injection  4,000 Units Intravenous Q M,W,F-HD  . feeding supplement (NEPRO CARB STEADY)  237 mL Oral BID BM  . furosemide  40 mg Oral Daily  . gabapentin  300 mg Oral QHS  . hydrALAZINE  10 mg Intravenous Once  . Melatonin  5 mg Oral QPM  . metoprolol succinate  25 mg Oral QHS  . multivitamin  1 tablet Oral QHS  . multivitamin-lutein  1 capsule Oral Daily  . sodium chloride flush  3 mL Intravenous Q12H  . vitamin C  500 mg Oral BID     Assessment/ Plan:  67 y.o. African-American male with end stage renal disease on hemodialysis, congestive heart failure, hypertension, peripheral vascular disease  Johnny [name]. MWF L AVF 61kg  1.  End-stage renal disease with hyperkalemia 2.  Anemia of chronic kidney disease- EPO with HD 3.  Secondary hyperparathyroidism- on binders 4.  Altered mental status, improved. 5.  calf wound- vascular surgery evaluation in progress  Plan:  Patient to have debridement of right lower extremity distal calf wound today.  Thereafter we will plan for hemodialysis as well.  Orders for dialysis have been prepared.  Continue Epogen during dialysis for treatment of anemia of chronic kidney disease.  Follow-up serum phosphorus today as well.  His altered mental status has improved with discontinuation of Neurontin.  We will continue to monitor his progress.   LOS: 7 Johnny [name] 12/4/201910:43 AM  [place], [place]  Note: This note was prepared with Dragon dictation. Any transcription errors are unintentional

## 2018-04-11 NOTE — Anesthesia Procedure Notes (Signed)
Performed by: Rhealyn Cullen, CRNA Pre-anesthesia Checklist: Patient identified, Emergency Drugs available, Suction available, Patient being monitored and Timeout performed Patient Re-evaluated:Patient Re-evaluated prior to induction Oxygen Delivery Method: Simple face mask Induction Type: IV induction       

## 2018-04-11 NOTE — Transfer of Care (Signed)
Immediate Anesthesia Transfer of Care Note  Patient: Johnny [name].  Procedure(s) Performed: DEBRIDEMENT WOUND calf muscle and skin (Right ) APPLICATION OF WOUND VAC (Right )  Patient Location: PACU  Anesthesia Type:General  Level of Consciousness: awake and responds to stimulation  Airway & Oxygen Therapy: Patient Spontanous Breathing and Patient connected to face mask oxygen  Post-op Assessment: Report given to RN and Post -op Vital signs reviewed and stable  Post vital signs: Reviewed and stable  Last Vitals:  Vitals Value Taken Time  BP 137/86 04/11/2018  2:20 PM  Temp    Pulse 77 04/11/2018  2:23 PM  Resp 14 04/11/2018  2:23 PM  SpO2 100 % 04/11/2018  2:23 PM  Vitals shown include unvalidated device data.  Last Pain:  Vitals:   04/11/18 1251  TempSrc: Tympanic  PainSc: 0-No pain      Patients Stated Pain Goal: 0 (50/56/97 9480)  Complications: No apparent anesthesia complications

## 2018-04-11 NOTE — Op Note (Signed)
    OPERATIVE NOTE   PROCEDURE: 1. Irrigation and debridement of skin, soft tissue, and muscle of the right calf and lower leg over approximately 100 cm2 with placement of VAC  PRE-OPERATIVE DIAGNOSIS: Nonviable tissue and infection of right calf and lower leg  POST-OPERATIVE DIAGNOSIS: Same as above  SURGEON: Leotis Pain, MD  ASSISTANT(S): None  ANESTHESIA: MAC  ESTIMATED BLOOD LOSS: 10 cc  FINDING(S): None  SPECIMEN(S):  none  INDICATIONS:   Johnny [name]. is a 67 y.o. male who presents with nonhealing ulcerations of the right posterior calf, lower leg, and down below the ankle near the heel.  He has had an ulceration of the right fifth toe with dark discoloration although this is markedly improved.  He has multiple medical comorbidities including end-stage renal disease.  He is being brought in for wound debridement to remove the dark eschar and the unhealthy tissue to try to promote wound healing.  Risks and benefits are discussed and the patient agrees to proceed.  DESCRIPTION: After obtaining full informed written consent, the patient was brought back to the operating room and placed supine upon the operating table.  The patient received IV antibiotics prior to induction.  After obtaining adequate anesthesia, the patient was prepped and draped in the standard fashion.  The wound was then opened and excisional debridement was performed to the skin, soft tissue, and down to the muscle to remove all clearly non-viable tissue.  The tissue was taken back to bleeding tissue that appeared viable.  The debridement was performed with scalpel and Metzenbaum scissors and encompassed an area of approximately 100 cm2.  The wound was about 16 to 17 cm in its longest length and about 8 to 10 cm across at its widest width although it was more narrow down closer to the foot and heel.  The wound was irrigated copiously with 1 L of pulse lavage saline.  After all clearly non-viable tissue was  removed, a silver VAC sponge was cut to fit the wound.  Strips of [place] were used and an occlusive seal was obtained once it was connected to suction. The patient was then awakened from anesthesia and taken to the recovery room in stable condition having tolerated the procedure well.  COMPLICATIONS: none  CONDITION: stable  Leotis Pain  04/11/2018, 2:25 PM   This note was created with Dragon Medical transcription system. Any errors in dictation are purely unintentional.

## 2018-04-11 NOTE — Progress Notes (Signed)
Pre HD Treatment    04/11/18 1645  Vital Signs  Temp 97.7 F (36.5 C)  Temp Source Oral  Pulse Rate 70  Pulse Rate Source Monitor  Resp 18  BP (!) 177/89  BP Location Right Arm  BP Method Automatic  Patient Position (if appropriate) Lying  Oxygen Therapy  SpO2 100 %  O2 Device Room Air  Pain Assessment  Pain Scale 0-10  Pain Score 5  Pain Type Surgical pain  Pain Location Leg  Pain Orientation Right  Pain Intervention(s) RN made aware  Multiple Pain Sites No  Dialysis Weight  Weight 74.4 kg (Patient has wound vac and SCDs attached post sx)  Time-Out for Hemodialysis  What Procedure? HD  Pt Identifiers(min of two) First/Last Name;MRN/Account#;Pt's DOB(use if MRN/Acct# not available  Correct Site? Yes  Correct Side? Yes  Correct Procedure? Yes  Consents Verified? Yes  Rad Studies Available? N/A  Safety Precautions Reviewed? Yes  Engineer, civil (consulting) Number 4  Station Number 1  UF/Alarm Test Passed  Conductivity: Meter 14  Conductivity: Machine  14.1  pH 7.4  Reverse Osmosis Main  Normal Saline Lot Number [id number]  Dialyzer Lot Number [id number]  Disposable Set Lot Number [id number]  Machine Temperature 98.6 F (37 C)  Musician and Audible Yes  Blood Lines Intact and Secured Yes  Pre Treatment Patient Checks  Vascular access used during treatment Catheter  Hepatitis B Surface Antigen Results Negative  Date Hepatitis B Surface Antigen Drawn 09/29/17  Isolation Initiated Yes  Hepatitis B Surface Antibody  (>10)  Date Hepatitis B Surface Antibody Drawn 01/29/18  Hemodialysis Consent Verified Yes  Hemodialysis Standing Orders Initiated Yes  ECG (Telemetry) Monitor On Yes  Prime Ordered Normal Saline  Length of  DialysisTreatment -hour(s) 3.5 Hour(s)  Dialysis Treatment Comments Na 140  Dialyzer Elisio 17H NR  Dialysate 2K, 2.5 Ca  Variable Sodium Other (Comment)  Dialysis Anticoagulant None  Dialysate Flow Ordered 800  Blood Flow Rate Ordered 400  mL/min  Ultrafiltration Goal 1.5 Liters  Pre Treatment Labs Phosphorus;Other (Comment)  Dialysis Blood Pressure Support Ordered Normal Saline  Education / Care Plan  Dialysis Education Provided Yes  Documented Education in Care Plan Yes  Note  Observations Pt alert and oriented x3  Fistula / Graft Left Arteriovenous fistula  No Placement Date or Time found.   Placed prior to admission: Yes  Orientation: Left  Access Type: Arteriovenous fistula  Site Condition No complications  Hemodialysis Catheter Right Subclavian Double-lumen  Placement Date/Time: (c) (c)   Placed prior to admission: No  Orientation: Right  Access Location: Subclavian  Hemodialysis Catheter Type: Double-lumen  Site Condition No complications  Blue Lumen Status Capped (Central line)  Red Lumen Status Capped (Central line)  Purple Lumen Status N/A  Dressing Type Biopatch;Occlusive  Dressing Status Dry;Intact;Old drainage  Drainage Description Tan

## 2018-04-11 NOTE — Progress Notes (Signed)
Pharmacy Antibiotic Note  Johnny [name]. is a 67 y.o. male admitted on 04/04/2018 with sepsis.  Pharmacy has been consulted for cefepime and vancomycin dosing. Afeb, WBC 11.7, lactic 1.28.  HD MWF. Has known osteomyelitis. Risk for bacteremia. No loading dose was given on 11/27, even through it was ordered.  Vancomycin level was ordered and returned at 5 mcg/mL on 12/2. Pt was attempted to be loaded again on 12/2 and vancomycin 1500 mg dose was ordered. Random level returned today at 18 and will not re-load the patient.  Vancomycin d/c 04/05/18,  Then restarted 11/29  Plan:  Day 6-  cefepime 1 g IV q24H -Hemodialysis dosing.   Continue with currently regimen. Patient is on  Vancomcyin 750 mg Qhemodialysis- M,W,F. Will order level once patient returns from procedure and follow up with abx plan.   Height: 5\' 8"  (172.7 cm) Weight: 136 lb 7.4 oz (61.9 kg) IBW/kg (Calculated) : 68.4  Temp (24hrs), Avg:98.3 F (36.8 C), Min:98.3 F (36.8 C), Max:98.4 F (36.9 C)  Recent Labs  Lab 04/05/18 0530 04/06/18 0342 04/07/18 0539 04/09/18 0550 04/09/18 0814 04/10/18 1043 04/11/18 0514  WBC 12.4* 14.0* 13.3* 12.3*  --  20.9* 15.7*  CREATININE 5.44*  --  4.36* 7.46*  --  4.97* 5.74*  VANCOTROUGH  --   --   --   --  5*  --   --   VANCORANDOM  --   --   --   --   --   --  18    Estimated Creatinine Clearance: 10.6 mL/min (A) (by C-G formula based on SCr of 5.74 mg/dL (H)).    Allergies  Allergen Reactions  . Shellfish Allergy Anaphylaxis    Antimicrobials this admission: 11/27 cefepime >>  11/27 vancomycin >>  11/27 metronidazole >>   Dose adjustments this admission: On HD MWF  Microbiology results: 11/27 BCx: NGTD  MRSA PCR neg.  Thank you for allowing pharmacy to be a part of this patient's care.  Oswald Hillock, PharmD, BCPS  Clinical pharmacist 04/11/2018 9:16 AM

## 2018-04-11 NOTE — Anesthesia Post-op Follow-up Note (Signed)
Anesthesia QCDR form completed.        

## 2018-04-12 ENCOUNTER — Encounter: Payer: Self-pay | Admitting: Vascular Surgery

## 2018-04-12 DIAGNOSIS — I739 Peripheral vascular disease, unspecified: Secondary | ICD-10-CM

## 2018-04-12 LAB — CBC
HCT: 30.6 % — ABNORMAL LOW (ref 39.0–52.0)
HEMOGLOBIN: 9.3 g/dL — AB (ref 13.0–17.0)
MCH: 30.9 pg (ref 26.0–34.0)
MCHC: 30.4 g/dL (ref 30.0–36.0)
MCV: 101.7 fL — ABNORMAL HIGH (ref 80.0–100.0)
Platelets: 142 10*3/uL — ABNORMAL LOW (ref 150–400)
RBC: 3.01 MIL/uL — ABNORMAL LOW (ref 4.22–5.81)
RDW: 16.5 % — ABNORMAL HIGH (ref 11.5–15.5)
WBC: 19.4 10*3/uL — ABNORMAL HIGH (ref 4.0–10.5)
nRBC: 0 % (ref 0.0–0.2)

## 2018-04-12 LAB — PHOSPHORUS: Phosphorus: 2.6 mg/dL (ref 2.5–4.6)

## 2018-04-12 LAB — GLUCOSE, CAPILLARY: Glucose-Capillary: 73 mg/dL (ref 70–99)

## 2018-04-12 MED ORDER — HYDROMORPHONE HCL 1 MG/ML IJ SOLN
1.0000 mg | Freq: Once | INTRAMUSCULAR | Status: AC
  Administered 2018-04-12: 1 mg via INTRAVENOUS
  Filled 2018-04-12: qty 1

## 2018-04-12 MED ORDER — APIXABAN 2.5 MG PO TABS
2.5000 mg | ORAL_TABLET | Freq: Two times a day (BID) | ORAL | Status: DC
  Administered 2018-04-12 – 2018-04-13 (×2): 2.5 mg via ORAL
  Filled 2018-04-12 (×2): qty 1

## 2018-04-12 MED ORDER — LORAZEPAM 0.5 MG PO TABS
0.5000 mg | ORAL_TABLET | Freq: Once | ORAL | Status: AC
  Administered 2018-04-12: 0.5 mg via ORAL
  Filled 2018-04-12: qty 1

## 2018-04-12 MED ORDER — ASPIRIN EC 81 MG PO TBEC
81.0000 mg | DELAYED_RELEASE_TABLET | Freq: Every day | ORAL | Status: DC
  Administered 2018-04-13: 81 mg via ORAL
  Filled 2018-04-12: qty 1

## 2018-04-12 MED ORDER — METOCLOPRAMIDE HCL 5 MG/ML IJ SOLN
5.0000 mg | Freq: Once | INTRAMUSCULAR | Status: AC
  Administered 2018-04-12: 5 mg via INTRAVENOUS
  Filled 2018-04-12: qty 2

## 2018-04-12 MED ORDER — HYDROCODONE-ACETAMINOPHEN 5-325 MG PO TABS
2.0000 | ORAL_TABLET | ORAL | Status: DC | PRN
  Administered 2018-04-12 – 2018-04-13 (×5): 2 via ORAL
  Administered 2018-04-13: 1 via ORAL
  Administered 2018-04-13: 2 via ORAL
  Filled 2018-04-12 (×6): qty 2

## 2018-04-12 NOTE — Progress Notes (Signed)
Late Entry  Patient's net UF was 525 after total treatment. His BVP was 58. Patient did not tolerate treatment well after administration of pain medication. MD aware and ordered interventions performed to address patient's blood pressure. All blood was returned to patient. Report called to primary RN.     04/11/18 2045  Hand-Off documentation  Report given to (Full Name) Valentina Shaggy, RN  Report received from (Full Name) Stephannie Peters, RN  Vital Signs  Temp 98 F (36.7 C)  Temp Source Oral  Pulse Rate 76  Pulse Rate Source Monitor  Resp 20  BP 103/71  BP Location Right Arm  BP Method Automatic  Patient Position (if appropriate) Lying  Oxygen Therapy  SpO2 98 %  O2 Device Room Air  Pain Assessment  Pain Scale 0-10  Pain Score 6  Hemodialysis Catheter Right Subclavian Double-lumen  Placement Date/Time: (c) (c)   Placed prior to admission: No  Orientation: Right  Access Location: Subclavian  Hemodialysis Catheter Type: Double-lumen  Site Condition No complications  Blue Lumen Status Capped (Central line);Heparin locked  Red Lumen Status Capped (Central line);Heparin locked  Purple Lumen Status N/A  Catheter fill solution Heparin 1000 units/ml  Catheter fill volume (Arterial) 1.8 cc  Catheter fill volume (Venous) 1.9  Dressing Type Biopatch;Occlusive  Dressing Status Clean;Dry;Intact  Interventions New dressing  Dressing Change Due 04/18/18  Post treatment catheter status Capped and Clamped

## 2018-04-12 NOTE — Anesthesia Postprocedure Evaluation (Signed)
Anesthesia Post Note  Patient: Johnny [name].  Procedure(s) Performed: DEBRIDEMENT WOUND calf muscle and skin (Right ) APPLICATION OF WOUND VAC (Right )  Patient location during evaluation: PACU Anesthesia Type: General Level of consciousness: awake and alert Pain management: pain level controlled Vital Signs Assessment: post-procedure vital signs reviewed and stable Respiratory status: spontaneous breathing, nonlabored ventilation, respiratory function stable and patient connected to nasal cannula oxygen Cardiovascular status: blood pressure returned to baseline and stable Postop Assessment: no apparent nausea or vomiting Anesthetic complications: no     Last Vitals:  Vitals:   04/12/18 0546 04/12/18 0715  BP: 102/65 139/74  Pulse: 72 75  Resp: 16 17  Temp: 36.6 C 36.8 C  SpO2: 100% 100%    Last Pain:  Vitals:   04/12/18 0715  TempSrc: Other (Comment)  PainSc: 8                  Dakwon Wenberg T Danyal Adorno

## 2018-04-12 NOTE — Progress Notes (Signed)
Late Entry HD Treatment Restarted    04/11/18 2015  Vital Signs  Pulse Rate 96  Pulse Rate Source Monitor  Resp 18  BP 109/71  BP Location Right Arm  BP Method Automatic  Patient Position (if appropriate) Lying  Oxygen Therapy  SpO2 100 %  O2 Device Room Air  Machine Checks  Normal Saline Lot Number [id number]  Dialyzer Lot Number [id number]  Disposable Set Lot Number [id number]  Pre Treatment Patient Checks  Prime Ordered Normal Saline  Dialyzer Elisio 17H NR  Dialysis Blood Pressure Support Ordered Normal Saline  During Hemodialysis Assessment  Blood Flow Rate (mL/min) 400 mL/min  Arterial Pressure (mmHg) -140 mmHg  Venous Pressure (mmHg) 150 mmHg  Transmembrane Pressure (mmHg) 50 mmHg  Ultrafiltration Rate (mL/min) 800 mL/min  Dialysate Flow Rate (mL/min) 800 ml/min  Conductivity: Machine  14  HD Safety Checks Performed Yes  Dialysis Fluid Bolus Normal Saline  Bolus Amount (mL) 250 mL  Intra-Hemodialysis Comments Tx initiated;Progressing as prescribed  Hemodialysis Catheter Right Subclavian Double-lumen  Placement Date/Time: (c) (c)   Placed prior to admission: No  Orientation: Right  Access Location: Subclavian  Hemodialysis Catheter Type: Double-lumen  Blue Lumen Status Infusing;Flushed  Red Lumen Status Infusing;Flushed

## 2018-04-12 NOTE — Progress Notes (Signed)
[facility] Daily Progress Note   Subjective: 1 Day Post-Op: Irrigation and debridement of skin, soft tissue, and muscle of the right calf and lower leg over approximately 100cm2 with placement of VAC  3 Day Post-Op: Ultrasound guidance for vascular access left femoral artery, Catheter placement into right SFA from left femoral approach, Selective right lower extremity angiogram, Percutaneous transluminal angioplasty of the right anterior tibial artery with a 4 mm diameter by 22 cm length Lutonix drug-coated angioplasty balloon, Percutaneous transluminal angioplasty of the right popliteal artery with a 6 mm diameter by 8 cm length Lutonix drug-coated angioplasty balloon with StarClose closure device left femoral artery  Patient with some nausea after breakfast this AM. Vomiting non-bilious food particles this AM while being seen. Otherwise no issues overnight.    Objective: Vitals:   04/11/18 1915 04/11/18 2128 04/12/18 0546 04/12/18 0715  BP: 125/77 (!) 142/79 102/65 139/74  Pulse: 85 86 72 75  Resp: '17 16 16 17  ' Temp:  98.1 F (36.7 C) 97.9 F (36.6 C) 98.2 F (36.8 C)  TempSrc:  Oral Oral Other (Comment)  SpO2: 100% 100% 100% 100%  Weight:      Height:        Intake/Output Summary (Last 24 hours) at 04/12/2018 1018 Last data filed at 04/12/2018 0546 Gross per 24 hour  Intake 520.81 ml  Output 52 ml  Net 468.81 ml   Physical Exam: A&Ox3, NAD CV: RRR Pulmonary: CTA Bilaterally Abdomen: Soft, Nontender, Nondistended, (+) Bowel Sounds Right Groin: No signs of infection, drainage, swelling Vascular:  Right Leg: Thigh soft, calf soft. VAC to seal and suction. Foot warm.     Laboratory: CBC    Component Value Date/Time   WBC 19.4 (H) 04/12/2018 0600   HGB 9.3 (L) 04/12/2018 0600   HGB 8.8 (L) 01/17/2014 0502   HCT 30.6 (L) 04/12/2018 0600   HCT 28.1 (L) 02/28/2018 1432   PLT 142 (L) 04/12/2018 0600   PLT 212 01/17/2014 0502   BMET    Component  Value Date/Time   NA 142 04/11/2018 0514   NA 139 01/17/2014 0502   K 3.8 04/11/2018 0514   K 4.8 01/17/2014 0502   CL 105 04/11/2018 0514   CL 102 01/17/2014 0502   CO2 30 04/11/2018 0514   CO2 27 01/17/2014 0502   GLUCOSE 87 04/11/2018 0514   GLUCOSE 101 (H) 01/17/2014 0502   BUN 27 (H) 04/11/2018 0514   BUN 62 (H) 01/17/2014 0502   CREATININE 5.74 (H) 04/11/2018 0514   CREATININE 12.30 (H) 01/17/2014 0502   CALCIUM 9.3 04/11/2018 0514   CALCIUM 8.2 (L) 01/17/2014 0502   GFRNONAA 9 (L) 04/11/2018 0514   GFRNONAA 4 (L) 01/17/2014 0502   GFRAA 11 (L) 04/11/2018 0514   GFRAA 4 (L) 01/17/2014 0502   Assessment/Planning: The patient is a 67 year old male with a PMHx of PAD and right lower extremity wound s/p RLE angiogram and right wound debridement 1) Right fifth digit did not need to be amputated as originally planned. Exam before surgery showed digit is viable. 2) Successful debridement of right calf / heel yesterday with VAC placement 3) Right lower extremity angiogram with improvement to arterial patency at the end of procedure. "Completion imaging showed marked improvement with only about a 20 to 25% residual stenosis in the 2 proximal areas of stenosis and about a 30% residual stenosis in the mid anterior tibial artery. A 73m diameter by 8cm length Lutonix drug-coated angioplasty  balloon was inflated to 12 atm for 1 minute. Completion imaging showed only about a 25 to 30% residual stenosis (posterior tibial artery)." 4) Patient should be able to heal recent debridement with improved arterial flow. 5) VAC changes M-W-F (will place in discharge information) 6) OK to from vascular standpoint to discharge when medical stable  Discussed with Dr. [facility] PA-C 04/12/2018 10:18 AM

## 2018-04-12 NOTE — Progress Notes (Signed)
Physical Therapy Treatment Patient Details Name: Johnny [name]. MRN: [id number] DOB: [birth year] Today's Date: 04/12/2018    History of Present Illness Pt is a 67 y/o M who presented with lethargy, confusion, and increased tremors BUEs suspected to be due to gabapentin toxicity.  Confusion since resolved. Pt is s/p RLE distal calf debridement on 12/4.  Pt s/p angioplasty RLE 12/2.  Pt s/p angiogram LLE on 04/02/18 and is scheduled for partial foot amputation in Dec per chart review.  Pt's PMH includes CHF, gout, dialysis pt, BLE ulcers.   PT Comments    Johnny [name] made good progress with mobility but requires min assist for sit>stand transfer due to instability.  His ambulatory distance is limited due to R calf pain after debridement the day prior.  Pt provided with AROM ankle pump exercise to be performed throughout the day to gently stretch this region.  SNF remains most appropriate d/c plan at this time.     Follow Up Recommendations  SNF     Equipment Recommendations  None recommended by PT    Recommendations for Other Services       Precautions / Restrictions Precautions Precautions: Fall;Other (comment) Precaution Comments: Painful ulcers BLE, wound vac Restrictions Weight Bearing Restrictions: Yes RLE Weight Bearing: Weight bearing as tolerated(verbally confirmed with Dr. Lucky Cowboy on 12/5)    Mobility  Bed Mobility Overal bed mobility: Needs Assistance Bed Mobility: Supine to Sit     Supine to sit: HOB elevated;Supervision     General bed mobility comments: HOB elevated and increased effort and time  Transfers Overall transfer level: Needs assistance Equipment used: Rolling walker (2 wheeled) Transfers: Sit to/from Stand Sit to Stand: Min assist         General transfer comment: Cues for proper hand placement. Pt with mild LOB standing at bedside at end of sit>stand requiring min assist to steady.   Ambulation/Gait Ambulation/Gait assistance: Min guard Gait  Distance (Feet): 45 Feet Assistive device: Rolling walker (2 wheeled) Gait Pattern/deviations: Step-to pattern;Decreased stance time - right;Decreased weight shift to right;Decreased dorsiflexion - right;Decreased step length - left;Antalgic;Trunk flexed Gait velocity: decreased Gait velocity interpretation: <1.31 ft/sec, indicative of household ambulator General Gait Details: Pt ambulates on forefoot on R with heel not making contact with floor due to pain since debridement.  Flexed and guarded posture with close min guard assist as pt with instability but no LOB.  Very decreased gait speed.  Cues for proper RW managment to not push it too far ahead and to keep feet within BOS of RW.    Stairs             Wheelchair Mobility    Modified Rankin (Stroke Patients Only)       Balance Overall balance assessment: Needs assistance Sitting-balance support: No upper extremity supported;Feet supported Sitting balance-Leahy Scale: Good     Standing balance support: Bilateral upper extremity supported;During functional activity Standing balance-Leahy Scale: Poor Standing balance comment: Pt relies on BUE support for static and dynamic activities                            Cognition Arousal/Alertness: Awake/alert Behavior During Therapy: WFL for tasks assessed/performed Overall Cognitive Status: Within Functional Limits for tasks assessed  Exercises General Exercises - Lower Extremity Ankle Circles/Pumps: AROM;Both;10 reps;Seated(encouraged pt to complete throughout day to dec tightness) Other Exercises Other Exercises: In standing, instruction to attempt to slowly move R heel to floor for gentle stretch of R calf with R hip slightly flexed.  x8    General Comments        Pertinent Vitals/Pain Pain Assessment: Faces Faces Pain Scale: Hurts little more Pain Location: R calf Pain Descriptors / Indicators:  Discomfort;Tightness Pain Intervention(s): Limited activity within patient's tolerance;Monitored during session;Repositioned;Utilized relaxation techniques    Home Living                      Prior Function            PT Goals (current goals can now be found in the care plan section) Acute Rehab PT Goals Patient Stated Goal: to become more independent PT Goal Formulation: With patient Time For Goal Achievement: 04/24/18 Potential to Achieve Goals: Good Progress towards PT goals: Progressing toward goals    Frequency    Min 2X/week      PT Plan Current plan remains appropriate    Co-evaluation              AM-PAC PT "6 Clicks" Mobility   Outcome Measure  Help needed turning from your back to your side while in a flat bed without using bedrails?: A Little Help needed moving from lying on your back to sitting on the side of a flat bed without using bedrails?: A Little Help needed moving to and from a bed to a chair (including a wheelchair)?: A Little Help needed standing up from a chair using your arms (e.g., wheelchair or bedside chair)?: A Little Help needed to walk in hospital room?: A Lot Help needed climbing 3-5 steps with a railing? : Total 6 Click Score: 15    End of Session Equipment Utilized During Treatment: Gait belt Activity Tolerance: Patient limited by pain;Patient tolerated treatment well;Patient limited by fatigue Patient left: with call bell/phone within reach;in bed;with bed alarm set;Other (comment)(sitting EOB) Nurse Communication: Mobility status;Other (comment)(pt sitting EOB) PT Visit Diagnosis: Pain;Difficulty in walking, not elsewhere classified (R26.2);Other abnormalities of gait and mobility (R26.89);Unsteadiness on feet (R26.81) Pain - Right/Left: Right(and L) Pain - part of body: Leg     Time: 1751-0258 PT Time Calculation (min) (ACUTE ONLY): 22 min  Charges:  $Gait Training: 8-22 mins                     Collie Siad PT, DPT 04/12/2018, 12:09 PM

## 2018-04-12 NOTE — Progress Notes (Signed)
Late Entry  Per Dr. Holley Raring, treatment terminated after patient experienced hypotensive episodes. Patient became nauseated and states he felt like he was going to pass out. Interventions performed. Patient is stable.     04/11/18 2042  Vital Signs  Pulse Rate 80  Pulse Rate Source Monitor  Resp (!) 22  BP (!) 86/56  BP Location Right Arm  BP Method Automatic  Patient Position (if appropriate) Lying  Oxygen Therapy  SpO2 99 %  O2 Device Room Air  During Hemodialysis Assessment  Intra-Hemodialysis Comments See progress note;Tx completed (HD treatment terminated UF 127)  Post-Hemodialysis Assessment  Rinseback Volume (mL) 250 mL  Dialyzer Clearance Lightly streaked  Duration of HD Treatment -hour(s) 0.25 hour(s)  Hemodialysis Intake (mL) 750 mL  UF Total -Machine (mL) 127 mL  Net UF (mL) -623 mL  Tolerated HD Treatment No (Comment)  Post-Hemodialysis Comments Treatment terminated due to pateint being unable to tolerate treatment

## 2018-04-12 NOTE — Discharge Instructions (Signed)
Vascular Surgery Wound Care VAC changes M-W-F Clean area with normal saline. Gently dry before reapplying VAC. Settings: 125, continuous

## 2018-04-12 NOTE — Consult Note (Signed)
Infectious Disease     Reason for Consult:Leukocytosis  Referring Physician: Saundra Shelling Date of Admission:  04/04/2018   Active Problems:   Altered mental status   Hypothermia   Pressure injury of skin   Malnutrition of moderate degree   HPI: Johnny [name]. is a 67 y.o. male admitted 11/27 with confusion and lethargy and hypothermia. He has hx ESRD, PAD, CHF and had on 11/25 had angiogram and PTCA on L.  Has been seen by vascular and had debridement of R calf and leg wound with wound vac placement.   He has been afebrile since admission and had initial wbc 11 but it had peaked 12/3 at 22.9 on December 3 and is currently 19.5 on December 4.  He has been on cefepime metronidazole and IV vancomycin from November 27.     Past Medical History:  Diagnosis Date  . Anemia   . Anxiety   . CHF (congestive heart failure) ([place])   . Chronic kidney disease   . Gout   . Hyperlipidemia   . Hypertension   . Peripheral vascular disease [facility])    Past Surgical History:  Procedure Laterality Date  . A/V SHUNTOGRAM Left 06/21/2017   Procedure: A/V SHUNTOGRAM;  Surgeon: Katha Cabal, MD;  Location: [place] [facility];  Service: Cardiovascular;  Laterality: Left;  . APPLICATION OF WOUND VAC Right 04/11/2018   Procedure: APPLICATION OF WOUND VAC;  Surgeon: Algernon Huxley, MD;  Location: [facility] ORS;  Service: Vascular;  Laterality: Right;  . AV FISTULA PLACEMENT Left 09/18/2015   Procedure: INSERTION OF ARTERIOVENOUS (AV) GORE-TEX GRAFT ARM ( BRACH/AXILLARY GRAFT W/ INSTANT STICK GRAFT );  Surgeon: Katha Cabal, MD;  Location: [facility];  Service: Vascular;  Laterality: Left;  . DIALYSIS FISTULA CREATION    . ESOPHAGOGASTRODUODENOSCOPY N/A 12/19/2017   Procedure: ESOPHAGOGASTRODUODENOSCOPY (EGD);  Surgeon: Lin Landsman, MD;  Location: [facility] ENDOS[facility];  Service: Gastroenterology;  Laterality: N/A;  . LOWER EXTREMITY ANGIOGRAPHY Left 11/16/2017   Procedure: LOWER EXTREMITY ANGIOGRAPHY;   Surgeon: Algernon Huxley, MD;  Location: [place] [facility];  Service: Cardiovascular;  Laterality: Left;  . LOWER EXTREMITY ANGIOGRAPHY Right 01/18/2018   Procedure: LOWER EXTREMITY ANGIOGRAPHY;  Surgeon: Algernon Huxley, MD;  Location: [place] [facility];  Service: Cardiovascular;  Laterality: Right;  . LOWER EXTREMITY ANGIOGRAPHY Left 04/02/2018   Procedure: LOWER EXTREMITY ANGIOGRAPHY;  Surgeon: Algernon Huxley, MD;  Location: [place] [facility];  Service: Cardiovascular;  Laterality: Left;  . LOWER EXTREMITY ANGIOGRAPHY Right 04/09/2018   Procedure: Lower Extremity Angiography with possible intervention;  Surgeon: Algernon Huxley, MD;  Location: [place] [facility];  Service: Cardiovascular;  Laterality: Right;  . PERIPHERAL VASCULAR CATHETERIZATION Left 09/01/2015   Procedure: A/V Shuntogram/Fistulagram;  Surgeon: Katha Cabal, MD;  Location: [place] [facility];  Service: Cardiovascular;  Laterality: Left;  . PERIPHERAL VASCULAR CATHETERIZATION N/A 09/30/2015   Procedure: A/V Shuntogram/Fistulagram with perm cathether removal;  Surgeon: Algernon Huxley, MD;  Location: [place] [facility];  Service: Cardiovascular;  Laterality: N/A;  . PERIPHERAL VASCULAR CATHETERIZATION Left 09/30/2015   Procedure: A/V Shunt Intervention;  Surgeon: Algernon Huxley, MD;  Location: [place] [facility];  Service: Cardiovascular;  Laterality: Left;  . PERIPHERAL VASCULAR CATHETERIZATION Left 12/03/2015   Procedure: Thrombectomy;  Surgeon: Algernon Huxley, MD;  Location: [place] [facility];  Service: Cardiovascular;  Laterality: Left;  . PERIPHERAL VASCULAR CATHETERIZATION Left 01/28/2016   Procedure: Thrombectomy;  Surgeon: Corene Cornea  Bunnie Domino, MD;  Location: [place] [facility];  Service: Cardiovascular;  Laterality: Left;  . PERIPHERAL VASCULAR CATHETERIZATION N/A 01/28/2016   Procedure: A/V Shuntogram/Fistulagram;  Surgeon: Algernon Huxley, MD;  Location: [place] [facility];  Service: Cardiovascular;  Laterality: N/A;  . WOUND  DEBRIDEMENT Right 04/11/2018   Procedure: DEBRIDEMENT WOUND calf muscle and skin;  Surgeon: Algernon Huxley, MD;  Location: [facility];  Service: Vascular;  Laterality: Right;   Social History   Tobacco Use  . Smoking status: Former Smoker    Types: Cigarettes  . Smokeless tobacco: Never Used  Substance Use Topics  . Alcohol use: No  . Drug use: No   Family History  Problem Relation Age of Onset  . Hypertension Unknown   . Heart disease Unknown   . Diabetes Mother     Allergies:  Allergies  Allergen Reactions  . Shellfish Allergy Anaphylaxis    Current antibiotics: Antibiotics Given (last 72 hours)    Date/Time Action Medication Dose Rate   04/09/18 1740 New Bag/Given   vancomycin (VANCOCIN) IVPB 750 mg/150 ml premix 750 mg 150 mL/hr   04/09/18 2058 New Bag/Given   ceFEPIme (MAXIPIME) 1 g in sodium chloride 0.9 % 100 mL IVPB 1 g 200 mL/hr   04/10/18 0016 New Bag/Given   metroNIDAZOLE (FLAGYL) IVPB 500 mg 500 mg 100 mL/hr   04/10/18 0825 New Bag/Given   metroNIDAZOLE (FLAGYL) IVPB 500 mg 500 mg 95 mL/hr   04/10/18 1606 New Bag/Given   metroNIDAZOLE (FLAGYL) IVPB 500 mg 500 mg 100 mL/hr   04/10/18 1610 New Bag/Given   ceFEPIme (MAXIPIME) 1 g in sodium chloride 0.9 % 100 mL IVPB 1 g 200 mL/hr   04/11/18 0129 New Bag/Given   metroNIDAZOLE (FLAGYL) IVPB 500 mg 500 mg 100 mL/hr   04/11/18 8338 New Bag/Given   metroNIDAZOLE (FLAGYL) IVPB 500 mg 500 mg 100 mL/hr   04/11/18 1614 New Bag/Given   ceFEPIme (MAXIPIME) 1 g in sodium chloride 0.9 % 100 mL IVPB 1 g 200 mL/hr   04/11/18 2137 New Bag/Given   metroNIDAZOLE (FLAGYL) IVPB 500 mg 500 mg 100 mL/hr   04/12/18 0745 New Bag/Given   metroNIDAZOLE (FLAGYL) IVPB 500 mg 500 mg 100 mL/hr      MEDICATIONS: . allopurinol  100 mg Oral Daily  . amLODipine  10 mg Oral QHS  . apixaban  2.5 mg Oral BID  . [START ON 04/13/2018] aspirin EC  81 mg Oral Daily  . atorvastatin  10 mg Oral Daily  . calcium acetate  1,334 mg Oral TID WC  .  Chlorhexidine Gluconate Cloth  6 each Topical Q0600  . epoetin (EPOGEN/PROCRIT) injection  4,000 Units Intravenous Q M,W,F-HD  . feeding supplement (NEPRO CARB STEADY)  237 mL Oral BID BM  . furosemide  40 mg Oral Daily  . gabapentin  300 mg Oral QHS  . hydrALAZINE  10 mg Intravenous Once  . Melatonin  5 mg Oral QPM  . metoprolol succinate  25 mg Oral QHS  . multivitamin  1 tablet Oral QHS  . multivitamin-lutein  1 capsule Oral Daily  . sodium chloride flush  3 mL Intravenous Q12H  . vitamin C  500 mg Oral BID    Review of Systems - 11 systems reviewed and negative per HPI   OBJECTIVE: Temp:  [97.4 F (36.3 C)-98.2 F (36.8 C)] 98.2 F (36.8 C) (12/05 0715) Pulse Rate:  [69-96] 75 (12/05 0715) Resp:  [12-24] 17 (12/05 0715) BP: (  59-177)/(42-89) 139/74 (12/05 0715) SpO2:  [94 %-100 %] 100 % (12/05 0715) Weight:  [74.4 kg] 74.4 kg (12/04 1645) Physical Exam  Constitutional: He is oriented to person, place, and time. Thin, frail HENT: anicteric Mouth/Throat: Oropharynx is clear and moist. No oropharyngeal exudate.  Cardiovascular: Normal rate, regular rhythm and normal heart sounds.  Pulmonary/Chest: Effort normal and breath sounds normal. No respiratory distress. He has no wheezes.  Abdominal: Soft. Bowel sounds are normal. He exhibits no distension. There is no tenderness.  Lymphadenopathy: He has no cervical adenopathy.  Neurological: He is alert and oriented to person, place, and time.  Skin: R calf with wound vac Psychiatric: He has a normal mood and affect. His behavior is normal.    LABS: Results for orders placed or performed during the hospital encounter of 04/04/18 (from the past 48 hour(s))  Vancomycin, random     Status: None   Collection Time: 04/11/18  5:14 AM  Result Value Ref Range   Vancomycin Rm 18     Comment:        Random Vancomycin therapeutic range is dependent on dosage and time of specimen collection. A peak range is 20.0-40.0 ug/mL A trough  range is 5.0-15.0 ug/mL        Performed at [facility], [address] [place], [place] [address]   CBC     Status: Abnormal   Collection Time: 04/11/18  5:14 AM  Result Value Ref Range   WBC 15.7 (H) 4.0 - 10.5 K/uL   RBC 2.96 (L) 4.22 - 5.81 MIL/uL   Hemoglobin 9.2 (L) 13.0 - 17.0 g/dL   HCT 29.7 (L) 39.0 - 52.0 %   MCV 100.3 (H) 80.0 - 100.0 fL   MCH 31.1 26.0 - 34.0 pg   MCHC 31.0 30.0 - 36.0 g/dL   RDW 16.4 (H) 11.5 - 15.5 %   Platelets 192 150 - 400 K/uL   nRBC 0.0 0.0 - 0.2 %    Comment: Performed at [facility], [address] [place], [place] [address]  Basic metabolic panel     Status: Abnormal   Collection Time: 04/11/18  5:14 AM  Result Value Ref Range   Sodium 142 135 - 145 mmol/L   Potassium 3.8 3.5 - 5.1 mmol/L   Chloride 105 98 - 111 mmol/L   CO2 30 22 - 32 mmol/L   Glucose, Bld 87 70 - 99 mg/dL   BUN 27 (H) 8 - 23 mg/dL   Creatinine, Ser 5.74 (H) 0.61 - 1.24 mg/dL   Calcium 9.3 8.9 - 10.3 mg/dL   GFR calc non Af Amer 9 (L) >60 mL/min   GFR calc Af Amer 11 (L) >60 mL/min   Anion gap 7 5 - 15    Comment: Performed at [facility], [address] [place], [place] [address]  Magnesium     Status: None   Collection Time: 04/11/18  5:14 AM  Result Value Ref Range   Magnesium 1.7 1.7 - 2.4 mg/dL    Comment: Performed at [facility], [address] [place], [place] [address]  Protime-INR     Status: Abnormal   Collection Time: 04/11/18  5:14 AM  Result Value Ref Range   Prothrombin Time 15.7 (H) 11.4 - 15.2 seconds   INR 1.26     Comment: Performed at [facility], [address], [place], [place] [address]  APTT     Status: Abnormal   Collection Time: 04/11/18  5:14 AM  Result Value Ref Range   aPTT 37 (H) 24 - 36 seconds    Comment:        IF BASELINE aPTT IS ELEVATED, SUGGEST PATIENT RISK ASSESSMENT BE USED TO DETERMINE APPROPRIATE ANTICOAGULANT THERAPY. Performed at [facility], [address] [place], [place] [address]   Type and screen     Status: None   Collection Time: 04/11/18  5:15 AM  Result Value Ref Range   ABO/RH(D) B NEG    Antibody Screen NEG    Sample Expiration      04/14/2018 Performed at [facility], [address] [place], [place] [address]   Phosphorus     Status: None   Collection Time: 04/11/18  5:25 PM  Result Value Ref Range   Phosphorus 2.9 2.5 - 4.6 mg/dL    Comment: Performed at [facility], [address] [place], [place] [address]  Phosphorus     Status: None   Collection Time: 04/12/18  6:00 AM  Result Value Ref Range   Phosphorus 2.6 2.5 - 4.6 mg/dL    Comment: Performed at [facility], [address] [place], [place] [address]  CBC     Status: Abnormal   Collection Time: 04/12/18  6:00 AM  Result Value Ref Range   WBC 19.4 (H) 4.0 - 10.5 K/uL   RBC 3.01 (L) 4.22 - 5.81 MIL/uL   Hemoglobin 9.3 (L) 13.0 - 17.0 g/dL   HCT 30.6 (L) 39.0 - 52.0 %   MCV 101.7 (H) 80.0 - 100.0 fL   MCH 30.9 26.0 - 34.0 pg   MCHC 30.4 30.0 - 36.0 g/dL   RDW 16.5 (H) 11.5 - 15.5 %   Platelets 142 (L) 150 - 400 K/uL   nRBC 0.0 0.0 - 0.2 %    Comment: Performed at [facility], [address] [place], [place] [address]  Glucose, capillary     Status: None   Collection Time: 04/12/18  7:21 AM  Result Value Ref Range   Glucose-Capillary 73 70 - 99 mg/dL   No components found for: ESR, C REACTIVE PROTEIN MICRO: Recent Results (from the past 720 hour(s))  Culture, blood (routine x 2)     Status: None   Collection Time: 04/04/18  5:34 AM  Result Value Ref Range Status   Specimen Description BLOOD BLOOD RIGHT HAND  Final   Special Requests   Final    BOTTLES DRAWN AEROBIC AND ANAEROBIC Blood Culture adequate volume   Culture   Final    NO GROWTH 5 DAYS Performed at [facility], [address], [place], [place] [address]    Report Status 04/09/2018 FINAL  Final  Culture, blood (routine x  2)     Status: None   Collection Time: 04/04/18  5:35 AM  Result Value Ref Range Status   Specimen Description BLOOD BLOOD LEFT HAND  Final   Special Requests   Final    BOTTLES DRAWN AEROBIC AND ANAEROBIC Blood Culture adequate volume   Culture   Final    NO GROWTH 5 DAYS Performed at [facility], [address], [place], [place] [address]    Report Status 04/09/2018 FINAL  Final  MRSA PCR Screening     Status: None   Collection Time: 04/04/18  6:22 PM  Result Value Ref Range Status   MRSA by PCR NEGATIVE NEGATIVE Final    Comment:        The  GeneXpert MRSA Assay (FDA approved for NASAL specimens only), is one component of a comprehensive MRSA colonization surveillance program. It is not intended to diagnose MRSA infection nor to guide or monitor treatment for MRSA infections. Performed at [facility], [address] [place], [place] [address]     IMAGING: Ct Head Wo Contrast  Result Date: 04/04/2018 CLINICAL DATA:  Tremors and confusion EXAM: CT HEAD WITHOUT CONTRAST TECHNIQUE: Contiguous axial images were obtained from the base of the skull through the vertex without intravenous contrast. COMPARISON:  01/20/2018 MRI FINDINGS: Brain: Mild atrophic changes and chronic white matter ischemic changes are noted similar to that seen on prior exam and commensurate with the patient's given age. Vascular: No hyperdense vessel or unexpected calcification. Skull: Normal. Negative for fracture or focal lesion. Sinuses/Orbits: No acute finding. Other: None. IMPRESSION: Chronic atrophic and ischemic changes without acute intracranial abnormality Electronically Signed   By: Inez Catalina M.D.   On: 04/04/2018 07:26   Dg Chest Port 1 View  Result Date: 04/04/2018 CLINICAL DATA:  Tremors and confusion. EXAM: PORTABLE CHEST 1 VIEW COMPARISON:  Radiographs 02/28/2018 FINDINGS: Right-sided dialysis catheter in the mid SVC. Upper normal heart size with mild aortic tortuosity.  Aortic atherosclerosis. No focal airspace disease, pulmonary edema, pleural effusion or pneumothorax. Vascular stents in the left axilla. IMPRESSION: No acute pulmonary process. Electronically Signed   By: Keith Rake M.D.   On: 04/04/2018 06:10    Assessment:   Johnny [name]. is a 67 y.o. male  admitted 11/27 with confusion and lethargy and hypothermia. He has hx ESRD, PAD, CHF and had on 11/25 had angiogram and PTCA on L.  Has been seen by vascular and had debridement of R calf and leg wound with wound vac placement.  Also had repeat angioplasty.   He has been afebrile since admission and had initial wbc 11 but it had peaked 12/3 at 22.9 on December 3 and is currently 19.5 on December 4.  He has been on cefepime metronidazole and IV vancomycin from November 27.   No cultures were done on wound but bcx and MRSA PCR negative.   Recommendations At this point I would suggest a 2 week course of oral abx including augmentin to cover broadly given the necrotic tissue present on admission prior to debridement andalso doxy 100 mg bid to cover for MRSA (even with MRSA PCR negative- was + in August). If worsens would culture the wound to ensure no resistant pathogens. Wound vac management per vascular surgery.  Thank you very much for allowing me to participate in the care of this patient. Please call with questions.   Cheral Marker. Ola Spurr, MD

## 2018-04-12 NOTE — Care Management Important Message (Signed)
Copy of Medicare IM left in patient's room.

## 2018-04-12 NOTE — Progress Notes (Signed)
Pharmacy Antibiotic Note  Johnny [name]. is a 67 y.o. male admitted on 04/04/2018 with sepsis.  Pharmacy has been consulted for cefepime and vancomycin dosing. Afeb, WBC 11.7, lactic 1.28.  HD MWF. Has known osteomyelitis. Risk for bacteremia. No loading dose was given on 11/27, even through it was ordered.  Vancomycin level was ordered and returned at 5 mcg/mL on 12/2. Pt was attempted to be loaded again on 12/2 and vancomycin 1500 mg dose was ordered. Random level returned at 18.   Vancomycin d/c 04/05/18,  Then restarted 11/29  Plan:  Day 6-  cefepime 1 g IV q24H -Hemodialysis dosing.   Continue with currently regimen. Patient is on  Vancomcyin 750 mg Qhemodialysis- M,W,F. Will order level for tomorrow prior to HD. Pt did not receive vancomycin dose yesterday.   Height: 5\' 8"  (172.7 cm) Weight: 164 lb 0.4 oz (74.4 kg)(Patient has wound vac and SCDs attached post sx) IBW/kg (Calculated) : 68.4  Temp (24hrs), Avg:97.7 F (36.5 C), Min:97 F (36.1 C), Max:98.2 F (36.8 C)  Recent Labs  Lab 04/07/18 0539 04/09/18 0550 04/09/18 0814 04/10/18 1043 04/11/18 0514 04/12/18 0600  WBC 13.3* 12.3*  --  20.9* 15.7* 19.4*  CREATININE 4.36* 7.46*  --  4.97* 5.74*  --   VANCOTROUGH  --   --  5*  --   --   --   VANCORANDOM  --   --   --   --  18  --     Estimated Creatinine Clearance: 11.8 mL/min (A) (by C-G formula based on SCr of 5.74 mg/dL (H)).    Allergies  Allergen Reactions  . Shellfish Allergy Anaphylaxis    Antimicrobials this admission: 11/27 cefepime >>  11/27 vancomycin >>  11/27 metronidazole >>   Dose adjustments this admission: On HD MWF  Microbiology results: 11/27 BCx: NGTD  MRSA PCR neg.  Thank you for allowing pharmacy to be a part of this patient's care.  Oswald Hillock, PharmD, BCPS  Clinical pharmacist 04/12/2018 9:13 AM

## 2018-04-12 NOTE — Progress Notes (Signed)
[place], [place] 04/12/18  Subjective:  She had an abbreviated dialysis treatment yesterday. Became hypotensive after he received pain medication. Today he is a bit nauseous but otherwise feeling well.  Objective:  Vital signs in last 24 hours:  Temp:  [97 F (36.1 C)-98.2 F (36.8 C)] 98.2 F (36.8 C) (12/05 0715) Pulse Rate:  [66-96] 75 (12/05 0715) Resp:  [12-21] 17 (12/05 0715) BP: (101-177)/(63-95) 139/74 (12/05 0715) SpO2:  [94 %-100 %] 100 % (12/05 0715) Weight:  [74.4 kg] 74.4 kg (12/04 1645)  Weight change:  Filed Weights   04/09/18 1508 04/09/18 1912 04/11/18 1645  Weight: 63 kg 61.9 kg 74.4 kg    Intake/Output:    Intake/Output Summary (Last 24 hours) at 04/12/2018 1121 Last data filed at 04/12/2018 1000 Gross per 24 hour  Intake 757.81 ml  Output 823 ml  Net -65.19 ml     Physical Exam: General:  No acute distress  HEENT  moist oral mucous membranes  Neck  supple  Pulm/lungs  normal breathing effort, clear  CVS/Heart  regular rhythm, 2/6 murmur  Abdomen:   Soft, nontender  Extremities:  No edema,  Rt leg bandage  Neurologic:  Alert, able to answer questions  Skin:  Wound vac RLE.    Access:  Right IJ PermCath       Basic Metabolic Panel:  Recent Labs  Lab 04/07/18 0539 04/09/18 0550 04/10/18 1043 04/11/18 0514 04/11/18 1725 04/12/18 0600  NA 137 138 139 142  --   --   K 3.6 3.8 3.7 3.8  --   --   CL 97* 101 103 105  --   --   CO2 30 27 26 30   --   --   GLUCOSE 82 70 230* 87  --   --   BUN 15 28* 19 27*  --   --   CREATININE 4.36* 7.46* 4.97* 5.74*  --   --   CALCIUM 8.9 9.2 8.8* 9.3  --   --   MG  --   --   --  1.7  --   --   PHOS  --   --   --   --  2.9 2.6     CBC: Recent Labs  Lab 04/07/18 0539 04/09/18 0550 04/10/18 1043 04/11/18 0514 04/12/18 0600  WBC 13.3* 12.3* 20.9* 15.7* 19.4*  HGB 10.2* 10.1* 9.7* 9.2* 9.3*  HCT 33.6* 33.0* 30.7* 29.7* 30.6*  MCV 100.6* 100.6* 100.0 100.3* 101.7*   PLT 247 249 200 192 142*      Lab Results  Component Value Date   HEPBSAG Negative 09/30/2015   HEPBSAB Reactive 01/29/2018      Microbiology:  Recent Results (from the past 240 hour(s))  Culture, blood (routine x 2)     Status: None   Collection Time: 04/04/18  5:34 AM  Result Value Ref Range Status   Specimen Description BLOOD BLOOD RIGHT HAND  Final   Special Requests   Final    BOTTLES DRAWN AEROBIC AND ANAEROBIC Blood Culture adequate volume   Culture   Final    NO GROWTH 5 DAYS Performed at [facility], [address] [place], [place] [address]    Report Status 04/09/2018 FINAL  Final  Culture, blood (routine x 2)     Status: None   Collection Time: 04/04/18  5:35 AM  Result Value Ref Range Status   Specimen Description BLOOD BLOOD LEFT HAND  Final   Special  Requests   Final    BOTTLES DRAWN AEROBIC AND ANAEROBIC Blood Culture adequate volume   Culture   Final    NO GROWTH 5 DAYS Performed at [facility], [address] [place], [place] [address]    Report Status 04/09/2018 FINAL  Final  MRSA PCR Screening     Status: None   Collection Time: 04/04/18  6:22 PM  Result Value Ref Range Status   MRSA by PCR NEGATIVE NEGATIVE Final    Comment:        The GeneXpert MRSA Assay (FDA approved for NASAL specimens only), is one component of a comprehensive MRSA colonization surveillance program. It is not intended to diagnose MRSA infection nor to guide or monitor treatment for MRSA infections. Performed at [facility], [address] [place], [place] [address]     Coagulation Studies: Recent Labs    04/11/18 0514  LABPROT 15.7*  INR 1.26    Urinalysis: No results for input(s): COLORURINE, LABSPEC, PHURINE, GLUCOSEU, HGBUR, BILIRUBINUR, KETONESUR, PROTEINUR, UROBILINOGEN, NITRITE, LEUKOCYTESUR in the last 72 hours.  Invalid input(s): APPERANCEUR    Imaging: No results found.   Medications:   . sodium chloride  Stopped (04/11/18 1614)  . ceFEPime (MAXIPIME) IV 200 mL/hr at 04/11/18 1615  . metronidazole 500 mg (04/12/18 0745)  . vancomycin 750 mg (04/09/18 1740)   . allopurinol  100 mg Oral Daily  . amLODipine  10 mg Oral QHS  . atorvastatin  10 mg Oral Daily  . calcium acetate  1,334 mg Oral TID WC  . Chlorhexidine Gluconate Cloth  6 each Topical Q0600  . epoetin (EPOGEN/PROCRIT) injection  4,000 Units Intravenous Q M,W,F-HD  . feeding supplement (NEPRO CARB STEADY)  237 mL Oral BID BM  . furosemide  40 mg Oral Daily  . gabapentin  300 mg Oral QHS  . hydrALAZINE  10 mg Intravenous Once  . Melatonin  5 mg Oral QPM  . metoCLOPramide (REGLAN) injection  5 mg Intravenous Once  . metoprolol succinate  25 mg Oral QHS  . multivitamin  1 tablet Oral QHS  . multivitamin-lutein  1 capsule Oral Daily  . sodium chloride flush  3 mL Intravenous Q12H  . vitamin C  500 mg Oral BID     Assessment/ Plan:  67 y.o. African-American male with end stage renal disease on hemodialysis, congestive heart failure, hypertension, peripheral vascular disease  [place]. MWF L AVF 61kg  1.  End-stage renal disease with hyperkalemia 2.  Anemia of chronic kidney disease- EPO with HD 3.  Secondary hyperparathyroidism- on binders 4.  Altered mental status, improved. 5.  calf wound- vascular surgery evaluation in progress  Plan:  Patient's dialysis treatment was abbreviated yesterday as he became hypotensive after he was administered pain medication.  Today he appears to be feeling much better.  We will plan for dialysis again tomorrow.  Hemoglobin relatively stable at 9.3.  Patient also has secondary hyperparathyroidism but phosphorus is well controlled at 2.6.  We will continue to monitor his progress.   LOS: 8 Chairty Toman 12/5/201911:21 AM  [place], [place]  Note: This note was prepared with Dragon dictation. Any transcription errors are  unintentional

## 2018-04-12 NOTE — Progress Notes (Signed)
[facility] [place] NAME: Johnny [name]    MR#:  [id number]  DATE OF BIRTH:  [birth year]  SUBJECTIVE:  CHIEF COMPLAINT:   Chief Complaint  Patient presents with  . Tremors  . Altered Mental Status  Patient seen and evaluated today Had wound debridement in the right calf yesterday by vascular surgery Patient had low blood pressure yesterday evening and dialysis had to be terminated Rapid response was called and patient blood pressure later on improved No fever Leukocytosis persisting   REVIEW OF SYSTEMS:    ROS  CONSTITUTIONAL: No documented fever.  Decreased fatigue, weakness. No weight gain, no weight loss.  EYES: No blurry or double vision.  ENT: No tinnitus. No postnasal drip. No redness of the oropharynx.  RESPIRATORY: No cough, no wheeze, no hemoptysis. No dyspnea.  CARDIOVASCULAR: No chest pain. No orthopnea. No palpitations. No syncope.  GASTROINTESTINAL: No nausea, no vomiting or diarrhea. No abdominal pain. No melena or hematochezia.  GENITOURINARY: No dysuria or hematuria.  ENDOCRINE: No polyuria or nocturia. No heat or cold intolerance.  HEMATOLOGY: No anemia. No bruising. No bleeding.  INTEGUMENTARY: No rashes. No lesions.  MUSCULOSKELETAL: right leg wound (POA) Wound VAC noted NEUROLOGIC: No numbness, tingling, or ataxia. No seizure-type activity.  PSYCHIATRIC: No anxiety. No insomnia. No ADD.   DRUG ALLERGIES:   Allergies  Allergen Reactions  . Shellfish Allergy Anaphylaxis    VITALS:  Blood pressure 139/74, pulse 75, temperature 98.2 F (36.8 C), temperature source Other (Comment), resp. rate 17, height 5\' 8"  (1.727 m), weight 74.4 kg, SpO2 100 %.  PHYSICAL EXAMINATION:   Physical Exam  GENERAL:  67 y.o.-year-old patient lying in the bed with no acute distress.  EYES: Pupils equal, round, reactive to light and accommodation. No scleral icterus. Extraocular muscles intact.  HEENT: Head atraumatic,  normocephalic. Oropharynx and nasopharynx clear.  NECK:  Supple, no jugular venous distention. No thyroid enlargement, no tenderness.  LUNGS: Normal breath sounds bilaterally, no wheezing, rales, rhonchi. No use of accessory muscles of respiration.  CARDIOVASCULAR: S1, S2 normal. No murmurs, rubs, or gallops.  ABDOMEN: Soft, nontender, nondistended. Bowel sounds present. No organomegaly or mass.  EXTREMITIES: No cyanosis, clubbing or edema b/l.    right leg bandage noted Right leg wound 17 /16 cm that is post debridement yesterday Currently has a wound VAC NEUROLOGIC: Cranial nerves II through XII are intact. No focal Motor or sensory deficits b/l.   PSYCHIATRIC: The patient is alert and oriented x 3.  SKIN: No obvious rash, lesion, or ulcer.   LABORATORY PANEL:   CBC Recent Labs  Lab 04/12/18 0600  WBC 19.4*  HGB 9.3*  HCT 30.6*  PLT 142*   ------------------------------------------------------------------------------------------------------------------ Chemistries  Recent Labs  Lab 04/11/18 0514  NA 142  K 3.8  CL 105  CO2 30  GLUCOSE 87  BUN 27*  CREATININE 5.74*  CALCIUM 9.3  MG 1.7   ------------------------------------------------------------------------------------------------------------------  Cardiac Enzymes No results for input(s): TROPONINI in the last 168 hours. ------------------------------------------------------------------------------------------------------------------  RADIOLOGY:  No results found.   ASSESSMENT AND PLAN:  67 year old male patient with history of peripheral arterial disease, osteomyelitis of the left foot, recent angiogram and left common femoral artery and proximal SFA, hypertension, gout, chronic congestive heart failure, peripheral vascular disease currently under hospitalist service  -Complicated right leg cellulitis and complicated wound infection with poor circulation Surgical debridement of right leg wound done by  vascular surgery Continue wound VAC and wound care management  -  Leukocytosis persisting Vancomycin, cefepime antibiotic and Flagyl antibiotics Infectious disease consult for expert opinion Cultures reveal no growth so far  -Acute encephalopathy resolved Probably secondary to gabapentin toxicity Decreased dose and resume gabapentin 300 mg nightly and patient better  -Systemic inflammatory response syndrome secondary to above improved No fevers Continue IV cefepime and IV Flagyl antibiotics MRSA PCR negative Cultures blood negative continue IV vancomycin in view of bad right leg wound, cellulitis and local infection during dialysis with elevated WBC count  -End-stage renal disease Continue dialysis as per nephrology   -DVT prophylaxis Resume oral Eliquis  -Peripheral vascular disease Continue aspirin and statin  -Mildly elevated troponin probably secondary to demand ischemia  -Disposition Probably tomorrow with home health services versus skilled nursing facility if WBC improves  All the records are reviewed and case discussed with Care Management/Social Worker. Management plans discussed with the patient, family and they are in agreement.  CODE STATUS: Full code  DVT Prophylaxis: SCDs  TOTAL TIME TAKING CARE OF THIS PATIENT: 33 minutes.   POSSIBLE D/C IN 1 DAYS, DEPENDING ON CLINICAL CONDITION.  Saundra Shelling M.D on 04/12/2018 at 1:48 PM  Between 7am to 6pm - Pager - [phone]  After 6pm go to [email or web address] - password [email or web address]  Office  [phone]  CC: Primary care physician; [name], MD  Note: This dictation was prepared with Dragon dictation along with smaller phrase technology. Any transcriptional errors that result from this process are unintentional.

## 2018-04-13 LAB — CBC
HCT: 29.5 % — ABNORMAL LOW (ref 39.0–52.0)
Hemoglobin: 8.9 g/dL — ABNORMAL LOW (ref 13.0–17.0)
MCH: 31.1 pg (ref 26.0–34.0)
MCHC: 30.2 g/dL (ref 30.0–36.0)
MCV: 103.1 fL — ABNORMAL HIGH (ref 80.0–100.0)
Platelets: 135 10*3/uL — ABNORMAL LOW (ref 150–400)
RBC: 2.86 MIL/uL — ABNORMAL LOW (ref 4.22–5.81)
RDW: 16.7 % — ABNORMAL HIGH (ref 11.5–15.5)
WBC: 16.7 10*3/uL — AB (ref 4.0–10.5)
nRBC: 0 % (ref 0.0–0.2)

## 2018-04-13 LAB — BASIC METABOLIC PANEL
Anion gap: 8 (ref 5–15)
BUN: 20 mg/dL (ref 8–23)
CHLORIDE: 104 mmol/L (ref 98–111)
CO2: 28 mmol/L (ref 22–32)
CREATININE: 5.64 mg/dL — AB (ref 0.61–1.24)
Calcium: 9.1 mg/dL (ref 8.9–10.3)
GFR calc Af Amer: 11 mL/min — ABNORMAL LOW (ref >60)
GFR calc non Af Amer: 9 mL/min — ABNORMAL LOW (ref >60)
Glucose, Bld: 83 mg/dL (ref 70–99)
Potassium: 3.5 mmol/L (ref 3.5–5.1)
Sodium: 140 mmol/L (ref 135–145)

## 2018-04-13 LAB — VANCOMYCIN, RANDOM: Vancomycin Rm: 14

## 2018-04-13 LAB — PHOSPHORUS: Phosphorus: 2.8 mg/dL (ref 2.5–4.6)

## 2018-04-13 MED ORDER — HYDROCODONE-ACETAMINOPHEN 5-325 MG PO TABS: 1.0000 | ORAL_TABLET | ORAL | 0 refills | Status: DC | PRN

## 2018-04-13 MED ORDER — AMOXICILLIN-POT CLAVULANATE 500-125 MG PO TABS
500.0000 mg | ORAL_TABLET | Freq: Every day | ORAL | Status: DC
  Administered 2018-04-13: 500 mg via ORAL
  Filled 2018-04-13: qty 1

## 2018-04-13 MED ORDER — DOXYCYCLINE HYCLATE 100 MG PO TABS: 100.0000 mg | ORAL_TABLET | Freq: Two times a day (BID) | ORAL | 0 refills | Status: AC

## 2018-04-13 MED ORDER — DOXYCYCLINE HYCLATE 100 MG PO TABS
100.0000 mg | ORAL_TABLET | Freq: Two times a day (BID) | ORAL | Status: DC
  Administered 2018-04-13: 100 mg via ORAL
  Filled 2018-04-13: qty 1

## 2018-04-13 MED ORDER — AMOXICILLIN-POT CLAVULANATE 500-125 MG PO TABS: 500.0000 mg | ORAL_TABLET | Freq: Every day | ORAL | 0 refills | Status: AC

## 2018-04-13 MED ORDER — CLONAZEPAM 0.5 MG PO TABS: 0.5000 mg | ORAL_TABLET | ORAL | 0 refills | Status: DC | PRN

## 2018-04-13 NOTE — Progress Notes (Signed)
[name], [place] 04/13/18  Subjective:  Patient seen during dialysis. Tolerating well BFR 350.     Objective:  Vital signs in last 24 hours:  Temp:  [98.1 F (36.7 C)-98.6 F (37 C)] 98.1 F (36.7 C) (12/06 1010) Pulse Rate:  [73-86] 76 (12/06 1015) Resp:  [16-18] 18 (12/06 1015) BP: (104-136)/(60-75) 126/69 (12/06 1015) SpO2:  [99 %-100 %] 99 % (12/06 0728)  Weight change:  Filed Weights   04/09/18 1508 04/09/18 1912 04/11/18 1645  Weight: 63 kg 61.9 kg 74.4 kg    Intake/Output:    Intake/Output Summary (Last 24 hours) at 04/13/2018 1023 Last data filed at 04/13/2018 1000 Gross per 24 hour  Intake 920.03 ml  Output 50 ml  Net 870.03 ml     Physical Exam: General:  No acute distress  HEENT  moist oral mucous membranes  Neck  supple  Pulm/lungs  normal breathing effort, clear  CVS/Heart  regular rhythm, 2/6 murmur  Abdomen:   Soft, nontender  Extremities:  No edema,  Rt leg bandage  Neurologic:  Alert, able to answer questions  Skin:  Wound vac RLE  Access:  Right IJ PermCath       Basic Metabolic Panel:  Recent Labs  Lab 04/07/18 0539 04/09/18 0550 04/10/18 1043 04/11/18 0514 04/11/18 1725 04/12/18 0600 04/13/18 0353  NA 137 138 139 142  --   --  140  K 3.6 3.8 3.7 3.8  --   --  3.5  CL 97* 101 103 105  --   --  104  CO2 30 27 26 30   --   --  28  GLUCOSE 82 70 230* 87  --   --  83  BUN 15 28* 19 27*  --   --  20  CREATININE 4.36* 7.46* 4.97* 5.74*  --   --  5.64*  CALCIUM 8.9 9.2 8.8* 9.3  --   --  9.1  MG  --   --   --  1.7  --   --   --   PHOS  --   --   --   --  2.9 2.6  --      CBC: Recent Labs  Lab 04/09/18 0550 04/10/18 1043 04/11/18 0514 04/12/18 0600 04/13/18 0353  WBC 12.3* 20.9* 15.7* 19.4* 16.7*  HGB 10.1* 9.7* 9.2* 9.3* 8.9*  HCT 33.0* 30.7* 29.7* 30.6* 29.5*  MCV 100.6* 100.0 100.3* 101.7* 103.1*  PLT 249 200 192 142* 135*      Lab Results  Component Value Date   HEPBSAG Negative  09/30/2015   HEPBSAB Reactive 01/29/2018      Microbiology:  Recent Results (from the past 240 hour(s))  Culture, blood (routine x 2)     Status: None   Collection Time: 04/04/18  5:34 AM  Result Value Ref Range Status   Specimen Description BLOOD BLOOD RIGHT HAND  Final   Special Requests   Final    BOTTLES DRAWN AEROBIC AND ANAEROBIC Blood Culture adequate volume   Culture   Final    NO GROWTH 5 DAYS Performed at [facility], [address], [place], [place] [address]    Report Status 04/09/2018 FINAL  Final  Culture, blood (routine x 2)     Status: None   Collection Time: 04/04/18  5:35 AM  Result Value Ref Range Status   Specimen Description BLOOD BLOOD LEFT HAND  Final   Special Requests   Final  BOTTLES DRAWN AEROBIC AND ANAEROBIC Blood Culture adequate volume   Culture   Final    NO GROWTH 5 DAYS Performed at [facility], [address] [place], [place] [address]    Report Status 04/09/2018 FINAL  Final  MRSA PCR Screening     Status: None   Collection Time: 04/04/18  6:22 PM  Result Value Ref Range Status   MRSA by PCR NEGATIVE NEGATIVE Final    Comment:        The GeneXpert MRSA Assay (FDA approved for NASAL specimens only), is one component of a comprehensive MRSA colonization surveillance program. It is not intended to diagnose MRSA infection nor to guide or monitor treatment for MRSA infections. Performed at [facility], [address] [place], [place] [address]     Coagulation Studies: Recent Labs    04/11/18 0514  LABPROT 15.7*  INR 1.26    Urinalysis: No results for input(s): COLORURINE, LABSPEC, PHURINE, GLUCOSEU, HGBUR, BILIRUBINUR, KETONESUR, PROTEINUR, UROBILINOGEN, NITRITE, LEUKOCYTESUR in the last 72 hours.  Invalid input(s): APPERANCEUR    Imaging: No results found.   Medications:   . sodium chloride Stopped (04/12/18 1707)   . allopurinol  100 mg Oral Daily  . amLODipine  10 mg Oral QHS   . amoxicillin-clavulanate  500 mg Oral Daily  . apixaban  2.5 mg Oral BID  . aspirin EC  81 mg Oral Daily  . atorvastatin  10 mg Oral Daily  . calcium acetate  1,334 mg Oral TID WC  . Chlorhexidine Gluconate Cloth  6 each Topical Q0600  . doxycycline  100 mg Oral Q12H  . epoetin (EPOGEN/PROCRIT) injection  4,000 Units Intravenous Q M,W,F-HD  . feeding supplement (NEPRO CARB STEADY)  237 mL Oral BID BM  . furosemide  40 mg Oral Daily  . gabapentin  300 mg Oral QHS  . hydrALAZINE  10 mg Intravenous Once  . Melatonin  5 mg Oral QPM  . metoprolol succinate  25 mg Oral QHS  . multivitamin  1 tablet Oral QHS  . multivitamin-lutein  1 capsule Oral Daily  . sodium chloride flush  3 mL Intravenous Q12H  . vitamin C  500 mg Oral BID     Assessment/ Plan:  67 y.o. African-American male with end stage renal disease on hemodialysis, congestive heart failure, hypertension, peripheral vascular disease  [place] [facility]. MWF L AVF 61kg  1.  End-stage renal disease with hyperkalemia 2.  Anemia of chronic kidney disease- EPO with HD 3.  Secondary hyperparathyroidism- on binders 4.  Altered mental status, improved. 5.  calf wound- vascular surgery evaluation in progress  Plan:  Patient seen and evaluated during hemodialysis.  Blood flow rate 350.  Tolerating dialysis well.  Continue Epogen with dialysis treatment today.  Follow-up serum phosphorus today as well.  Wound VAC in place.  Patient will be discharged to a rehabilitation facility in the near future.   LOS: 9 Kiosha Buchan 12/6/201910:23 [place], [place]  Note: This note was prepared with Dragon dictation. Any transcription errors are unintentional

## 2018-04-13 NOTE — Clinical Social Work Placement (Signed)
   CLINICAL SOCIAL WORK PLACEMENT  NOTE  Date:  04/13/2018  Patient Details  Name: Johnny [name]. MRN: [id number] Date of Birth: 06/20/1950  Clinical Social Work is seeking post-discharge placement for this patient at the [place] level of care (*CSW will initial, date and re-position this form in  chart as items are completed):  Yes   Patient/family provided with [place] Work Department's list of facilities offering this level of care within the geographic area requested by the patient (or if unable, by the patient's family).  Yes   Patient/family informed of their freedom to choose among providers that offer the needed level of care, that participate in Medicare, Medicaid or managed care program needed by the patient, have an available bed and are willing to accept the patient.  Yes   Patient/family informed of 's ownership interest in [facility] and [facility], as well as of the fact that they are under no obligation to receive care at these facilities.  PASRR submitted to EDS on       PASRR number received on       Existing PASRR number confirmed on 04/11/18     FL2 transmitted to all facilities in geographic area requested by pt/family on 04/11/18     FL2 transmitted to all facilities within larger geographic area on       Patient informed that his/her managed care company has contracts with or will negotiate with certain facilities, including the following:        Yes   Patient/family informed of bed offers received.  Patient chooses bed at [facility])     Physician recommends and patient chooses bed at [facility])    Patient to be transferred to [facility]) on 04/13/18.  Patient to be transferred to facility by (EMS)     Patient family notified on 04/13/18 of transfer.  Name of family member notified:  [name])     PHYSICIAN       Additional Comment:     _______________________________________________ Shela Leff, LCSW 04/13/2018, 11:33 AM

## 2018-04-13 NOTE — Progress Notes (Signed)
Facility notified RN stating that they will not be able to have a wound vac for the patient until Monday. On call vascular was notified. Verbal order from Marion PA that pt may have a wet to dry dressing BID until Monday. Health visitor and receiving nurse was made aware of wet to dry dressing BID until Monday until wound vac is available for pt.   Carron Jaggi [employer]

## 2018-04-13 NOTE — Progress Notes (Signed)
This note also relates to the following rows which could not be included: Pulse Rate - Cannot attach notes to unvalidated device data Resp - Cannot attach notes to unvalidated device data  Hd copleted  

## 2018-04-13 NOTE — Progress Notes (Signed)
This note also relates to the following rows which could not be included: Pulse Rate - Cannot attach notes to unvalidated device data Resp - Cannot attach notes to unvalidated device data BP - Cannot attach notes to unvalidated device data  Hd started  

## 2018-04-13 NOTE — Discharge Summary (Signed)
[facility] [place] NAME: Johnny [name]    MR#:  [id number]  DATE OF BIRTH:  [birth year]  DATE OF ADMISSION:  04/04/2018 ADMITTING PHYSICIAN: Saundra Shelling, MD  DATE OF DISCHARGE: 04/13/2018  PRIMARY CARE PHYSICIAN: [name], MD   ADMISSION DIAGNOSIS:  Uremia [N19] Coarse tremors [G25.2] Essential hypertension [I10] Altered mental status, unspecified altered mental status type [R41.82] Hypothermia [T68.XXXA] History of osteomyelitis of the left foot DISCHARGE DIAGNOSIS:  Systemic inflammatory response syndrome Complicated right leg cellulitis with wound infection Complicated right leg wound infection Acute encephalopathy End-stage renal disease on dialysis Peripheral vascular disease Leukocytosis Hyperkalemia History of osteomyelitis of left foot SECONDARY DIAGNOSIS:   Past Medical History:  Diagnosis Date  . Anemia   . Anxiety   . CHF (congestive heart failure) ([place])   . Chronic kidney disease   . Gout   . Hyperlipidemia   . Hypertension   . Peripheral vascular disease [facility])      ADMITTING HISTORY Johnny [name]  is a 67 y.o. male with a known history of pretension, hyperlipidemia, peripheral vascular disease, chronic congestive heart failure, end-stage renal disease on dialysis presented to the emergency room for lethargy and confusion.  Patient also complaining of increased tremors in the upper extremities.  He was evaluated in the emergency room found to have low body temperature.  Patient was put on warming blanket.  Patient has history of peripheral arterial disease with osteomyelitis of the left foot.  On 04/02/2018 he underwent angiogram with PCTA of the left posterior tibial artery and tibioperoneal trunk distal SFA and above-knee popliteal artery left common femoral artery and proximal SFA.  Patient has been scheduled for partial foot amputation in December.  Hospitalist service was consulted.  HOSPITAL COURSE:   Patient was initially admitted to medical floor for hypothermia systemic inflammatory response syndrome.  He had a right leg wound.  He was put on warming blanket and broad-spectrum IV antibiotics.  He received dialysis as per schedule by nephrology.  His mental status improved.  Patient had a prolonged hospitalization.  His WBC count persisted to be high.  Vascular surgery consultation was done for his right lower extremity wound and cellulitis.  Patient received IV vancomycin, cefepime and Flagyl antibiotics during his hospitalization.  MRSA PCR was negative blood cultures did not reveal any growth.  Continue aspirin, statin and Eliquis medication during the hospitalization.  For surgical procedures his aspirin and Eliquis were held along with Plavix.  Patient had surgical debridement of the right leg wound and wound VAC was placed.  Tensive surgical debridement and necrotic slough of the wound was removed by vascular surgery.  According to vascular surgery right fifth digit need not be amputated as the digit is viable.  Successful debridement of right calf, he was done wound VAC placement.  Right lower extremity angiogram was also done during hospitalization with improvement to arterial patency at the end of the procedure.  Patient was evaluated by infectious disease attending to for the elevated WBC count.  Patient received broad-spectrum IV antibiotics with anaerobic coverage during the entire length of hospitalization.  He was transitioned to oral doxycycline and Augmentin antibiotics.  Patient unable to take care of himself at home and needs complex wound care management.  Social service consultation was done and patient has a bed at [facility].  During hospitalization patient was worked up with CT head showed no acute abnormality.  His mental status is back to baseline.  Wound VAC to be changed on Monday Wednesday Friday.  CONSULTS OBTAINED:  Treatment Team:  Murlean Iba,  MD Schnier, Dolores Lory, MD  DRUG ALLERGIES:   Allergies  Allergen Reactions  . Shellfish Allergy Anaphylaxis    DISCHARGE MEDICATIONS:   Allergies as of 04/13/2018      Reactions   Shellfish Allergy Anaphylaxis      Medication List    TAKE these medications   allopurinol 100 MG tablet Commonly known as:  ZYLOPRIM Take 100 mg by mouth daily.   amLODipine 10 MG tablet Commonly known as:  NORVASC Take 10 mg by mouth at bedtime.   amoxicillin-clavulanate 500-125 MG tablet Commonly known as:  AUGMENTIN Take 1 tablet (500 mg total) by mouth daily for 14 days.   apixaban 2.5 MG Tabs tablet Commonly known as:  ELIQUIS Take 2.5 mg by mouth 2 (two) times daily.   aspirin EC 81 MG tablet Take 1 tablet (81 mg total) by mouth daily.   atorvastatin 10 MG tablet Commonly known as:  LIPITOR Take 1 tablet by mouth daily.   calcium acetate 667 MG capsule Commonly known as:  PHOSLO Take 1,334 mg by mouth 3 (three) times daily with meals.   clonazePAM 0.5 MG tablet Commonly known as:  KLONOPIN Take 0.5 mg by mouth as needed.   clopidogrel 75 MG tablet Commonly known as:  PLAVIX Take 1 tablet (75 mg total) by mouth daily.   colchicine 0.6 MG tablet Take 0.6 mg by mouth daily as needed (for gout flares).   doxycycline 100 MG tablet Commonly known as:  VIBRA-TABS Take 1 tablet (100 mg total) by mouth every 12 (twelve) hours for 14 days.   folic acid-vitamin b complex-vitamin c-selenium-zinc 3 MG Tabs tablet Take by mouth.   furosemide 40 MG tablet Commonly known as:  LASIX Take 40 mg by mouth daily.   gabapentin 100 MG capsule Commonly known as:  NEURONTIN Take 300 mg by mouth 3 (three) times daily.   HYDROcodone-acetaminophen 5-325 MG tablet Commonly known as:  NORCO/VICODIN Take 1 tablet by mouth every 4 (four) hours as needed for moderate pain.   Melatonin 3 MG Tabs Take 5 mg by mouth every evening.   metoprolol succinate 25 MG 24 hr tablet Commonly known  as:  TOPROL-XL Take 25 mg by mouth at bedtime.       Today  Patient seen and elevated today No fever No shortness of breath No chest pain Tolerating diet well  VITAL SIGNS:  Blood pressure 92/61, pulse 81, temperature 98.1 F (36.7 C), temperature source Oral, resp. rate 17, height 5\' 8"  (1.727 m), weight 74.4 kg, SpO2 99 %.  I/O:    Intake/Output Summary (Last 24 hours) at 04/13/2018 1049 Last data filed at 04/13/2018 1000 Gross per 24 hour  Intake 920.03 ml  Output 50 ml  Net 870.03 ml    PHYSICAL EXAMINATION:  Physical Exam  GENERAL:  67 y.o.-year-old patient lying in the bed with no acute distress.  LUNGS: Normal breath sounds bilaterally, no wheezing, rales,rhonchi or crepitation. No use of accessory muscles of respiration.  CARDIOVASCULAR: S1, S2 normal. No murmurs, rubs, or gallops.  ABDOMEN: Soft, non-tender, non-distended. Bowel sounds present. No organomegaly or mass.  NEUROLOGIC: Moves all 4 extremities. PSYCHIATRIC: The patient is alert and oriented x 3.  SKIN: No obvious rash, lesion, or ulcer.   DATA REVIEW:   CBC Recent Labs  Lab 04/13/18 0353  WBC 16.7*  HGB 8.9*  HCT 29.5*  PLT  135*    Chemistries  Recent Labs  Lab 04/11/18 0514 04/13/18 0353  NA 142 140  K 3.8 3.5  CL 105 104  CO2 30 28  GLUCOSE 87 83  BUN 27* 20  CREATININE 5.74* 5.64*  CALCIUM 9.3 9.1  MG 1.7  --     Cardiac Enzymes No results for input(s): TROPONINI in the last 168 hours.  Microbiology Results  Results for orders placed or performed during the hospital encounter of 04/04/18  Culture, blood (routine x 2)     Status: None   Collection Time: 04/04/18  5:34 AM  Result Value Ref Range Status   Specimen Description BLOOD BLOOD RIGHT HAND  Final   Special Requests   Final    BOTTLES DRAWN AEROBIC AND ANAEROBIC Blood Culture adequate volume   Culture   Final    NO GROWTH 5 DAYS Performed at [facility], [address] [place], [place] [address]     Report Status 04/09/2018 FINAL  Final  Culture, blood (routine x 2)     Status: None   Collection Time: 04/04/18  5:35 AM  Result Value Ref Range Status   Specimen Description BLOOD BLOOD LEFT HAND  Final   Special Requests   Final    BOTTLES DRAWN AEROBIC AND ANAEROBIC Blood Culture adequate volume   Culture   Final    NO GROWTH 5 DAYS Performed at [facility], [address] [place], [place] [address]    Report Status 04/09/2018 FINAL  Final  MRSA PCR Screening     Status: None   Collection Time: 04/04/18  6:22 PM  Result Value Ref Range Status   MRSA by PCR NEGATIVE NEGATIVE Final    Comment:        The GeneXpert MRSA Assay (FDA approved for NASAL specimens only), is one component of a comprehensive MRSA colonization surveillance program. It is not intended to diagnose MRSA infection nor to guide or monitor treatment for MRSA infections. Performed at [facility], [address], [place], [place] [address]     RADIOLOGY:  No results found.  Follow up with PCP in 1 week.  Management plans discussed with the patient, family and they are in agreement.  CODE STATUS: Full code    Code Status Orders  (From admission, onward)         Start     Ordered   04/04/18 1027  Full code  Continuous     04/04/18 1027        Code Status History    Date Active Date Inactive Code Status Order ID Comments User Context   04/02/2018 1025 04/02/2018 1537 Full Code [id number]  Algernon Huxley, MD Inpatient   01/29/2018 1316 02/01/2018 2154 Full Code [id number]  Nicholes Mango, MD Inpatient   01/20/2018 1233 01/24/2018 2031 Full Code [id number]  Sela Hua, MD Inpatient   01/18/2018 1230 01/19/2018 1018 Full Code [id number]  Algernon Huxley, MD Inpatient   12/19/2017 0112 12/25/2017 2152 Full Code [id number]  Lance Coon, MD Inpatient   11/27/2017 1831 12/01/2017 2320 Full Code [id number]  Saundra Shelling, MD Inpatient   11/19/2017 0507 11/21/2017 0009 Full Code [id number]   Harrie Foreman, MD Inpatient   11/16/2017 1205 11/16/2017 1717 Full Code [id number]  Algernon Huxley, MD Inpatient   01/28/2016 1050 01/28/2016 1456 Full Code [id number]  Algernon Huxley, MD Inpatient   09/28/2015 2306 10/02/2015 1738 Full Code [id number]  Lance Coon, MD Inpatient  Advance Directive Documentation     Most Recent Value  Type of Advance Directive  Living will  Pre-existing out of facility DNR order (yellow form or pink MOST form)  -  "MOST" Form in Place?  -      TOTAL TIME TAKING CARE OF THIS PATIENT ON DAY OF DISCHARGE: more than 35 minutes.   Saundra Shelling M.D on 04/13/2018 at 10:49 AM  Between 7am to 6pm - Pager - [phone]  After 6pm go to [email or web address] - password [email or web address] [facility]  Office  [phone]  CC: Primary care physician; [name], MD  Note: This dictation was prepared with Dragon dictation along with smaller phrase technology. Any transcriptional errors that result from this process are unintentional.

## 2018-04-13 NOTE — Clinical Social Work Note (Signed)
Physician informed CSW that patient will discharge today. Discharge information sent to [facility] and will go after hemodialysis. CSW has informed Magda Paganini that patient may be a later discharge. Shela Leff MSW,LCSW [phone]

## 2018-04-13 NOTE — Care Management (Signed)
Johnny [name] with [facility] and [facility] dialysis liaison notified of discharge to [facility]

## 2018-04-13 NOTE — Progress Notes (Signed)
Pt prepared for d/c to SNF. IV d/c'd. Wound vac dressing removed, wet to dry was placed on wound per verbal order from vascular. Skin intact except as charted in most recent assessments. Vitals are stable. Report called to receiving facility. Pt to be transported by ambulance service.   Charvi Gammage [employer]

## 2018-04-17 DIAGNOSIS — I739 Peripheral vascular disease, unspecified: Secondary | ICD-10-CM | POA: Insufficient documentation

## 2018-04-24 ENCOUNTER — Other Ambulatory Visit: Payer: Self-pay

## 2018-04-24 ENCOUNTER — Encounter

## 2018-04-24 ENCOUNTER — Ambulatory Visit (INDEPENDENT_AMBULATORY_CARE_PROVIDER_SITE_OTHER): Payer: Medicare Other

## 2018-04-24 ENCOUNTER — Ambulatory Visit (INDEPENDENT_AMBULATORY_CARE_PROVIDER_SITE_OTHER): Payer: Medicare Other | Admitting: Vascular Surgery

## 2018-04-24 ENCOUNTER — Other Ambulatory Visit (INDEPENDENT_AMBULATORY_CARE_PROVIDER_SITE_OTHER): Payer: Self-pay | Admitting: Vascular Surgery

## 2018-04-24 ENCOUNTER — Encounter (INDEPENDENT_AMBULATORY_CARE_PROVIDER_SITE_OTHER): Payer: Self-pay | Admitting: Vascular Surgery

## 2018-04-24 VITALS — BP 143/81 | HR 71 | Resp 17 | Ht 68.0 in | Wt 135.0 lb

## 2018-04-24 DIAGNOSIS — Z992 Dependence on renal dialysis: Secondary | ICD-10-CM

## 2018-04-24 DIAGNOSIS — E785 Hyperlipidemia, unspecified: Secondary | ICD-10-CM | POA: Diagnosis not present

## 2018-04-24 DIAGNOSIS — Z9862 Peripheral vascular angioplasty status: Secondary | ICD-10-CM

## 2018-04-24 DIAGNOSIS — I70261 Atherosclerosis of native arteries of extremities with gangrene, right leg: Secondary | ICD-10-CM

## 2018-04-24 DIAGNOSIS — I7025 Atherosclerosis of native arteries of other extremities with ulceration: Secondary | ICD-10-CM | POA: Diagnosis not present

## 2018-04-24 DIAGNOSIS — I10 Essential (primary) hypertension: Secondary | ICD-10-CM

## 2018-04-24 DIAGNOSIS — N186 End stage renal disease: Secondary | ICD-10-CM | POA: Diagnosis not present

## 2018-04-24 DIAGNOSIS — E1169 Type 2 diabetes mellitus with other specified complication: Secondary | ICD-10-CM

## 2018-04-24 DIAGNOSIS — I120 Hypertensive chronic kidney disease with stage 5 chronic kidney disease or end stage renal disease: Secondary | ICD-10-CM

## 2018-04-24 NOTE — Patient Instructions (Signed)

## 2018-04-24 NOTE — Progress Notes (Signed)
MRN : [id number]  Johnny [name]. is a 67 y.o. (November 03, 1950) male who presents with chief complaint of  Chief Complaint  Patient presents with  . Follow-up    wound vac  .  History of Present Illness: Patient returns today in follow up of his leg wounds and PAD.  He has excellent granulation tissue on his posterior calf and lower leg wounds on the right.  He had debridement a couple of weeks ago.  He has a known history of arterial disease but is undergone multiple previous treatments.  The VAC on the right leg precluded adequate ABIs on the right but he has excellent bleeding with VAC removal.  His left ABI is noncompressible but his digit pressure is 108 and his waveforms are biphasic so his perfusion is pretty good on the left leg.  Duplex findings on the right lower extremity did not demonstrate any obvious hemodynamically significant stenosis.  He has stable ulcerations on the right fifth toe and the left fourth toe.  No signs of infection.  Current Outpatient Medications  Medication Sig Dispense Refill  . aspirin EC 81 MG tablet Take 1 tablet (81 mg total) by mouth daily. 150 tablet 2  . atorvastatin (LIPITOR) 10 MG tablet Take 1 tablet by mouth daily.    . calcium acetate (PHOSLO) 667 MG capsule Take 1,334 mg by mouth 3 (three) times daily with meals.    . clonazePAM (KLONOPIN) 0.5 MG tablet Take 1 tablet (0.5 mg total) by mouth as needed. 10 tablet 0  . clopidogrel (PLAVIX) 75 MG tablet Take 1 tablet (75 mg total) by mouth daily. 30 tablet 11  . colchicine 0.6 MG tablet Take 0.6 mg by mouth daily as needed (for gout flares).     [name] doxycycline (VIBRA-TABS) 100 MG tablet Take 1 tablet (100 mg total) by mouth every 12 (twelve) hours for 14 days. 28 tablet 0  . furosemide (LASIX) 40 MG tablet Take 40 mg by mouth daily.     [name] gabapentin (NEURONTIN) 100 MG capsule Take 300 mg by mouth 3 (three) times daily.    [name] HYDROcodone-acetaminophen (NORCO/VICODIN) 5-325 MG tablet Take 1 tablet by  mouth every 4 (four) hours as needed for moderate pain. 20 tablet 0  . Melatonin 3 MG TABS Take 5 mg by mouth every evening.     . metoprolol succinate (TOPROL-XL) 25 MG 24 hr tablet Take 25 mg by mouth at bedtime.    [name] allopurinol (ZYLOPRIM) 100 MG tablet Take 100 mg by mouth daily.    [name] amLODipine (NORVASC) 10 MG tablet Take 10 mg by mouth at bedtime.     [name] amoxicillin-clavulanate (AUGMENTIN) 500-125 MG tablet Take 1 tablet (500 mg total) by mouth daily for 14 days. 14 tablet 0  . apixaban (ELIQUIS) 2.5 MG TABS tablet Take 2.5 mg by mouth 2 (two) times daily.    . folic acid-vitamin b complex-vitamin c-selenium-zinc (DIALYVITE) 3 MG TABS tablet Take by mouth.     No current facility-administered medications for this visit.     Past Medical History:  Diagnosis Date  . Anemia   . Anxiety   . CHF (congestive heart failure) ([place])   . Chronic kidney disease   . Gout   . Hyperlipidemia   . Hypertension   . Peripheral vascular disease [facility])     Past Surgical History:  Procedure Laterality Date  . A/V SHUNTOGRAM Left 06/21/2017   Procedure: A/V SHUNTOGRAM;  Surgeon: Katha Cabal, MD;  Location: [place] [facility];  Service: Cardiovascular;  Laterality: Left;  . APPLICATION OF WOUND VAC Right 04/11/2018   Procedure: APPLICATION OF WOUND VAC;  Surgeon: Algernon Huxley, MD;  Location: [facility] ORS;  Service: Vascular;  Laterality: Right;  . AV FISTULA PLACEMENT Left 09/18/2015   Procedure: INSERTION OF ARTERIOVENOUS (AV) GORE-TEX GRAFT ARM ( BRACH/AXILLARY GRAFT W/ INSTANT STICK GRAFT );  Surgeon: Katha Cabal, MD;  Location: [facility] ORS;  Service: Vascular;  Laterality: Left;  . DIALYSIS FISTULA CREATION    . ESOPHAGOGASTRODUODENOSCOPY N/A 12/19/2017   Procedure: ESOPHAGOGASTRODUODENOSCOPY (EGD);  Surgeon: Lin Landsman, MD;  Location: [facility] ENDOSCOPY;  Service: Gastroenterology;  Laterality: N/A;  . LOWER EXTREMITY ANGIOGRAPHY Left 11/16/2017   Procedure: LOWER EXTREMITY ANGIOGRAPHY;   Surgeon: Algernon Huxley, MD;  Location: [place] [facility];  Service: Cardiovascular;  Laterality: Left;  . LOWER EXTREMITY ANGIOGRAPHY Right 01/18/2018   Procedure: LOWER EXTREMITY ANGIOGRAPHY;  Surgeon: Algernon Huxley, MD;  Location: [place] [facility];  Service: Cardiovascular;  Laterality: Right;  . LOWER EXTREMITY ANGIOGRAPHY Left 04/02/2018   Procedure: LOWER EXTREMITY ANGIOGRAPHY;  Surgeon: Algernon Huxley, MD;  Location: [place] [facility];  Service: Cardiovascular;  Laterality: Left;  . LOWER EXTREMITY ANGIOGRAPHY Right 04/09/2018   Procedure: Lower Extremity Angiography with possible intervention;  Surgeon: Algernon Huxley, MD;  Location: [place] [facility];  Service: Cardiovascular;  Laterality: Right;  . PERIPHERAL VASCULAR CATHETERIZATION Left 09/01/2015   Procedure: A/V Shuntogram/Fistulagram;  Surgeon: Katha Cabal, MD;  Location: [place] [facility];  Service: Cardiovascular;  Laterality: Left;  . PERIPHERAL VASCULAR CATHETERIZATION N/A 09/30/2015   Procedure: A/V Shuntogram/Fistulagram with perm cathether removal;  Surgeon: Algernon Huxley, MD;  Location: [place] [facility];  Service: Cardiovascular;  Laterality: N/A;  . PERIPHERAL VASCULAR CATHETERIZATION Left 09/30/2015   Procedure: A/V Shunt Intervention;  Surgeon: Algernon Huxley, MD;  Location: [place] [facility];  Service: Cardiovascular;  Laterality: Left;  . PERIPHERAL VASCULAR CATHETERIZATION Left 12/03/2015   Procedure: Thrombectomy;  Surgeon: Algernon Huxley, MD;  Location: [place] [facility];  Service: Cardiovascular;  Laterality: Left;  . PERIPHERAL VASCULAR CATHETERIZATION Left 01/28/2016   Procedure: Thrombectomy;  Surgeon: Algernon Huxley, MD;  Location: [place] [facility];  Service: Cardiovascular;  Laterality: Left;  . PERIPHERAL VASCULAR CATHETERIZATION N/A 01/28/2016   Procedure: A/V Shuntogram/Fistulagram;  Surgeon: Algernon Huxley, MD;  Location: [place] [facility];  Service: Cardiovascular;  Laterality: N/A;  . WOUND  DEBRIDEMENT Right 04/11/2018   Procedure: DEBRIDEMENT WOUND calf muscle and skin;  Surgeon: Algernon Huxley, MD;  Location: [facility] ORS;  Service: Vascular;  Laterality: Right;    Social History       Tobacco Use  . Smoking status: Former Research scientist (life sciences)  . Smokeless tobacco: Never Used  Substance Use Topics  . Alcohol use: No  . Drug use: No     Family History      Family History  Problem Relation Age of Onset  . Hypertension Unknown   . Heart disease Unknown   no bleeding disorders, clotting disorders,aneurysms,or autoimmune diseases      Allergies  Allergen Reactions  . Shellfish Allergy Anaphylaxis     REVIEW OF SYSTEMS(Negative unless checked)  Constitutional: '[]' ?Weight loss'[]' ?Fever'[]' ?Chills Cardiac:'[]' ?Chest pain'[]' ?Chest pressure'[]' ?Palpitations '[]' ?Shortness of breath when laying flat '[]' ?Shortness of breath at rest '[]' ?Shortness of breath with exertion. Vascular: '[x]' ?Pain in legs with walking'[x]' ?Pain in legsat rest'[]' ?Pain in legs when laying flat '[]' ?Claudication '[]' ?Pain in feet  when walking '[]' ?Pain in feet at rest '[]' ?Pain in feet when laying flat '[]' ?History of DVT '[]' ?Phlebitis '[]' ?Swelling in legs '[]' ?Varicose veins '[x]' ?Non-healing ulcers Pulmonary: '[]' ?Uses home oxygen '[]' ?Productive cough'[]' ?Hemoptysis '[]' ?Wheeze '[]' ?COPD '[]' ?Asthma Neurologic: '[]' ?Dizziness '[]' ?Blackouts '[]' ?Seizures '[]' ?History of stroke '[]' ?History of TIA'[]' ?Aphasia '[]' ?Temporary blindness'[]' ?Dysphagia '[]' ?Weaknessor numbness in arms '[]' ?Weakness or numbnessin legs Musculoskeletal: '[x]' ?Arthritis '[]' ?Joint swelling '[]' ?Joint pain '[]' ?Low back pain Hematologic:'[]' ?Easy bruising'[]' ?Easy bleeding '[]' ?Hypercoagulable state '[x]' ?Anemic  Gastrointestinal:'[]' ?Blood in stool'[]' ?Vomiting blood'[]' ?Gastroesophageal reflux/heartburn'[]' ?Abdominal pain Genitourinary: '[x]' ?Chronic kidney disease '[]' ?Difficulturination  '[]' ?Frequenturination '[]' ?Burning with urination'[]' ?Hematuria Skin: '[]' ?Rashes '[x]' ?Ulcers '[x]' ?Wounds Psychological: '[]' ?History of anxiety'[]' ?History of major depression.    Physical Examination  BP (!) 143/81   Pulse 71   Resp 17   Ht '5\' 8"'  (1.727 m)   Wt 135 lb (61.2 kg)   BMI 20.53 kg/m  Gen:  WD/WN, NAD Head: [place]/AT, No temporalis wasting. Ear/Nose/Throat: Hearing diminished, nares w/o erythema or drainage Eyes: Conjunctiva clear. Sclera non-icteric Neck: Supple.  Trachea midline Pulmonary:  Good air movement, no use of accessory muscles.  Cardiac: Irregular Vascular: Good thrill in his fistula Vessel Right Left  Radial Palpable Palpable                          PT  trace palpable  1+ palpable  DP  1+ palpable  1+ palpable   Musculoskeletal: M/S 5/5 throughout.  No deformity or atrophy.  Stable ulcerations on the right fifth toe and the left fourth toe.  He has excellent granulation tissue with good bleeding on his posterior leg and calf ulcerations on the right.  Mild bilateral lower extremity edema. Neurologic: Sensation grossly intact in extremities.  Symmetrical.  Speech is fluent.  Psychiatric: Judgment and insight appear to be fair. Dermatologic: Wounds as above       Labs Recent Results (from the past 2160 hour(s))  CBC     Status: Abnormal   Collection Time: 01/29/18  7:46 AM  Result Value Ref Range   WBC 26.0 (H) 3.8 - 10.6 K/uL   RBC 3.89 (L) 4.40 - 5.90 MIL/uL   Hemoglobin 12.3 (L) 13.0 - 18.0 g/dL   HCT 36.9 (L) 40.0 - 52.0 %   MCV 95.0 80.0 - 100.0 fL   MCH 31.5 26.0 - 34.0 pg   MCHC 33.2 32.0 - 36.0 g/dL   RDW 15.7 (H) 11.5 - 14.5 %   Platelets 361 150 - 440 K/uL    Comment: Performed at [facility], [address] [place], [place] [address]  Troponin I     Status: Abnormal   Collection Time: 01/29/18  7:46 AM  Result Value Ref Range   Troponin I 0.09 (HH) <0.03 ng/mL    Comment: CRITICAL RESULT CALLED TO, READ  BACK BY AND VERIFIED WITH KIM GAULT AT 9485 01/29/18 DAS Performed at [facility], [address] [place], [place] [address]   Comprehensive metabolic panel     Status: Abnormal   Collection Time: 01/29/18  7:46 AM  Result Value Ref Range   Sodium 137 135 - 145 mmol/L   Potassium 4.4 3.5 - 5.1 mmol/L    Comment: HEMOLYSIS AT THIS LEVEL MAY AFFECT RESULT   Chloride 94 (L) 98 - 111 mmol/L   CO2 25 22 - 32 mmol/L   Glucose, Bld 99 70 - 99 mg/dL   BUN 34 (H) 8 - 23 mg/dL   Creatinine, Ser 7.68 (H) 0.61 - 1.24 mg/dL   Calcium 10.0 8.9 - 10.3 mg/dL   Total Protein 8.2 (  H) 6.5 - 8.1 g/dL   Albumin 3.5 3.5 - 5.0 g/dL   AST 40 15 - 41 U/L    Comment: HEMOLYSIS AT THIS LEVEL MAY AFFECT RESULT   ALT 10 0 - 44 U/L   Alkaline Phosphatase 76 38 - 126 U/L   Total Bilirubin 0.9 0.3 - 1.2 mg/dL    Comment: HEMOLYSIS AT THIS LEVEL MAY AFFECT RESULT   GFR calc non Af Amer 6 (L) >60 mL/min   GFR calc Af Amer 7 (L) >60 mL/min    Comment: (NOTE) The eGFR has been calculated using the CKD EPI equation. This calculation has not been validated in all clinical situations. eGFR's persistently <60 mL/min signify possible Chronic Kidney Disease.    Anion gap 18 (H) 5 - 15    Comment: Performed at [facility], [address] [place], [place] [address]  Troponin I-serum (0, 3, 6 hours)     Status: Abnormal   Collection Time: 01/29/18  1:17 PM  Result Value Ref Range   Troponin I 0.07 (HH) <0.03 ng/mL    Comment: CRITICAL VALUE NOTED. VALUE IS CONSISTENT WITH PREVIOUSLY REPORTED/CALLED VALUE. JML Performed at [facility], [address] [place], [place] [address]   Troponin I-serum (0, 3, 6 hours)     Status: Abnormal   Collection Time: 01/29/18  4:17 PM  Result Value Ref Range   Troponin I 0.08 (HH) <0.03 ng/mL    Comment: CRITICAL VALUE NOTED. VALUE IS CONSISTENT WITH PREVIOUSLY REPORTED/CALLED VALUE. JML Performed at [facility], [address]  [place], [place] [address]   Phosphorus     Status: None   Collection Time: 01/29/18  4:17 PM  Result Value Ref Range   Phosphorus 3.3 2.5 - 4.6 mg/dL    Comment: Performed at [facility], [address] [place], [place] [address]  Hepatitis B surface antibody     Status: None   Collection Time: 01/29/18  4:17 PM  Result Value Ref Range   Hep B S Ab Reactive     Comment: (NOTE)              Non Reactive: Inconsistent with immunity,                            less than 10 mIU/mL              Reactive:     Consistent with immunity,                            greater than 9.9 mIU/mL Performed At: [facility] [place], [place] [id number] Rush Farmer MD [id number]   Culture, blood (Routine X 2) w Reflex to ID Panel     Status: None   Collection Time: 01/29/18 10:56 PM  Result Value Ref Range   Specimen Description BLOOD BLOOD RIGHT HAND    Special Requests      BOTTLES DRAWN AEROBIC AND ANAEROBIC Blood Culture results may not be optimal due to an inadequate volume of blood received in culture bottles   Culture      NO GROWTH 5 DAYS Performed at [facility], [address], [place], [place] [address]    Report Status 02/03/2018 FINAL   Troponin I-serum (0, 3, 6 hours)     Status: Abnormal   Collection Time: 01/29/18 10:57 PM  Result Value Ref  Range   Troponin I 0.07 (HH) <0.03 ng/mL    Comment: CRITICAL VALUE NOTED. VALUE IS CONSISTENT WITH PREVIOUSLY REPORTED/CALLED VALUE / FLC Performed at [facility], [address] [place], [place] [address]   Culture, blood (Routine X 2) w Reflex to ID Panel     Status: None   Collection Time: 01/29/18 10:58 PM  Result Value Ref Range   Specimen Description BLOOD BLOOD RIGHT HAND    Special Requests      BOTTLES DRAWN AEROBIC AND ANAEROBIC Blood Culture results may not be optimal due to an inadequate volume of blood received in culture bottles   Culture      NO GROWTH 5  DAYS Performed at [facility], [address] [place], [place] [address]    Report Status 02/03/2018 FINAL   CBC     Status: Abnormal   Collection Time: 01/30/18  9:37 AM  Result Value Ref Range   WBC 10.4 3.8 - 10.6 K/uL   RBC 3.57 (L) 4.40 - 5.90 MIL/uL   Hemoglobin 11.1 (L) 13.0 - 18.0 g/dL   HCT 33.9 (L) 40.0 - 52.0 %   MCV 95.0 80.0 - 100.0 fL   MCH 31.1 26.0 - 34.0 pg   MCHC 32.8 32.0 - 36.0 g/dL   RDW 16.2 (H) 11.5 - 14.5 %   Platelets 318 150 - 440 K/uL    Comment: Performed at [facility], [address] [place], [place] [address]  Comprehensive metabolic panel     Status: Abnormal   Collection Time: 02/28/18  2:16 PM  Result Value Ref Range   Sodium 137 135 - 145 mmol/L   Potassium 3.7 3.5 - 5.1 mmol/L    Comment: HEMOLYSIS AT THIS LEVEL MAY AFFECT RESULT   Chloride 100 98 - 111 mmol/L   CO2 27 22 - 32 mmol/L   Glucose, Bld 111 (H) 70 - 99 mg/dL   BUN 19 8 - 23 mg/dL   Creatinine, Ser 4.34 (H) 0.61 - 1.24 mg/dL   Calcium 8.4 (L) 8.9 - 10.3 mg/dL   Total Protein 6.2 (L) 6.5 - 8.1 g/dL   Albumin 2.8 (L) 3.5 - 5.0 g/dL   AST 30 15 - 41 U/L   ALT 14 0 - 44 U/L   Alkaline Phosphatase 72 38 - 126 U/L   Total Bilirubin 0.5 0.3 - 1.2 mg/dL   GFR calc non Af Amer 13 (L) >60 mL/min   GFR calc Af Amer 15 (L) >60 mL/min    Comment: (NOTE) The eGFR has been calculated using the CKD EPI equation. This calculation has not been validated in all clinical situations. eGFR's persistently <60 mL/min signify possible Chronic Kidney Disease.    Anion gap 10 5 - 15    Comment: Performed at [facility], [address] [place], [place] [address]  CBC with Differential     Status: Abnormal   Collection Time: 02/28/18  2:16 PM  Result Value Ref Range   WBC 14.1 (H) 4.0 - 10.5 K/uL   RBC 3.27 (L) 4.22 - 5.81 MIL/uL   Hemoglobin 10.1 (L) 13.0 - 17.0 g/dL   HCT 32.5 (L) 39.0 - 52.0 %   MCV 99.4 80.0 - 100.0 fL   MCH 30.9 26.0 - 34.0 pg   MCHC 31.1  30.0 - 36.0 g/dL   RDW 17.6 (H) 11.5 - 15.5 %   Platelets 235 150 - 400 K/uL   nRBC 0.0 0.0 - 0.2 %  Neutrophils Relative % 78 %   Neutro Abs 11.0 (H) 1.7 - 7.7 K/uL   Lymphocytes Relative 10 %   Lymphs Abs 1.4 0.7 - 4.0 K/uL   Monocytes Relative 9 %   Monocytes Absolute 1.2 (H) 0.1 - 1.0 K/uL   Eosinophils Relative 1 %   Eosinophils Absolute 0.2 0.0 - 0.5 K/uL   Basophils Relative 1 %   Basophils Absolute 0.1 0.0 - 0.1 K/uL   Immature Granulocytes 1 %   Abs Immature Granulocytes 0.10 (H) 0.00 - 0.07 K/uL    Comment: Performed at [facility], [address] [place], [place] [address]  Vitamin B12     Status: None   Collection Time: 02/28/18  2:16 PM  Result Value Ref Range   Vitamin B-12 266 180 - 914 pg/mL    Comment: (NOTE) This assay is not validated for testing neonatal or myeloproliferative syndrome specimens for Vitamin B12 levels. Performed at [facility], [address] [place], [place] [address]   Folate RBC     Status: Abnormal   Collection Time: 02/28/18  2:32 PM  Result Value Ref Range   Folate, Hemolysate 486.0 Not Estab. ng/mL   Hematocrit 28.1 (L) 37.5 - 51.0 %   Folate, RBC 1,730 >498 ng/mL    Comment: (NOTE) Performed At: [facility] [place], [place] [id number] Rush Farmer MD [id number]   Troponin I     Status: Abnormal   Collection Time: 02/28/18  2:32 PM  Result Value Ref Range   Troponin I 0.05 (HH) <0.03 ng/mL    Comment: CRITICAL RESULT CALLED TO, READ BACK BY AND VERIFIED WITH GRACIE [facility] '@1519'  02/28/18 BY FMW Performed at [facility], [address] [place], [place] [address]   Influenza panel by PCR (type A & B)     Status: None   Collection Time: 02/28/18  2:50 PM  Result Value Ref Range   Influenza A By PCR NEGATIVE NEGATIVE   Influenza B By PCR NEGATIVE NEGATIVE    Comment: (NOTE) The Xpert Xpress Flu assay is intended as an aid in the diagnosis of  influenza and should  not be used as a sole basis for treatment.  This  assay is FDA approved for nasopharyngeal swab specimens only. Nasal  washings and aspirates are unacceptable for Xpert Xpress Flu testing. Performed at [facility], [address] [place], [place] [address]   Troponin I     Status: Abnormal   Collection Time: 02/28/18  4:30 PM  Result Value Ref Range   Troponin I 0.05 (HH) <0.03 ng/mL    Comment: CRITICAL VALUE NOTED. VALUE IS CONSISTENT WITH PREVIOUSLY REPORTED/CALLED VALUE KLW Performed at [facility], [address] [place], [place] [address]   APTT     Status: None   Collection Time: 03/22/18  1:45 PM  Result Value Ref Range   aPTT 36 24 - 36 seconds    Comment: Performed at [facility] [address] [place],  [address]  Basic metabolic panel     Status: Abnormal   Collection Time: 03/22/18  1:45 PM  Result Value Ref Range   Sodium 141 135 - 145 mmol/L   Potassium 3.8 3.5 - 5.1 mmol/L   Chloride 99 98 - 111 mmol/L   CO2 30 22 - 32 mmol/L   Glucose, Bld 68 (L) 70 - 99 mg/dL   BUN 19 8 - 23 mg/dL   Creatinine, Ser 4.95 (  H) 0.61 - 1.24 mg/dL   Calcium 9.9 8.9 - 10.3 mg/dL   GFR calc non Af Amer 11 (L) >60 mL/min   GFR calc Af Amer 13 (L) >60 mL/min    Comment: (NOTE) The eGFR has been calculated using the CKD EPI equation. This calculation has not been validated in all clinical situations. eGFR's persistently <60 mL/min signify possible Chronic Kidney Disease.    Anion gap 12 5 - 15    Comment: Performed at [facility], [address] [place], [place] [address]  CBC WITH DIFFERENTIAL     Status: Abnormal   Collection Time: 03/22/18  1:45 PM  Result Value Ref Range   WBC 9.4 4.0 - 10.5 K/uL   RBC 4.15 (L) 4.22 - 5.81 MIL/uL   Hemoglobin 12.7 (L) 13.0 - 17.0 g/dL   HCT 42.6 39.0 - 52.0 %   MCV 102.7 (H) 80.0 - 100.0 fL   MCH 30.6 26.0 - 34.0 pg   MCHC 29.8 (L) 30.0 - 36.0 g/dL   RDW 17.1 (H) 11.5 - 15.5 %    Platelets 234 150 - 400 K/uL   nRBC 0.0 0.0 - 0.2 %   Neutrophils Relative % 63 %   Neutro Abs 5.9 1.7 - 7.7 K/uL   Lymphocytes Relative 24 %   Lymphs Abs 2.3 0.7 - 4.0 K/uL   Monocytes Relative 9 %   Monocytes Absolute 0.9 0.1 - 1.0 K/uL   Eosinophils Relative 2 %   Eosinophils Absolute 0.2 0.0 - 0.5 K/uL   Basophils Relative 1 %   Basophils Absolute 0.1 0.0 - 0.1 K/uL   Immature Granulocytes 1 %   Abs Immature Granulocytes 0.05 0.00 - 0.07 K/uL    Comment: Performed at [facility], [address] [place], [place] [address]  Protime-INR     Status: None   Collection Time: 03/22/18  1:45 PM  Result Value Ref Range   Prothrombin Time 14.1 11.4 - 15.2 seconds   INR 1.10     Comment: Performed at [facility], [address] [place], [place] [address]  Type and screen     Status: None   Collection Time: 03/22/18  1:45 PM  Result Value Ref Range   ABO/RH(D) B NEG    Antibody Screen NEG    Sample Expiration 04/05/2018    Extend sample reason      NO TRANSFUSIONS OR PREGNANCY IN THE PAST 3 MONTHS Performed at [facility], [address] [place], [place] [address]   Potassium [facility] vascular lab only)     Status: None   Collection Time: 04/02/18  8:34 AM  Result Value Ref Range   Potassium [facility] vascular lab) 4.8 3.5 - 5.1    Comment: Performed at [facility], [address] [place], [place] [address]  CBC with Differential     Status: Abnormal   Collection Time: 04/04/18  5:34 AM  Result Value Ref Range   WBC 11.7 (H) 4.0 - 10.5 K/uL   RBC 3.24 (L) 4.22 - 5.81 MIL/uL   Hemoglobin 10.1 (L) 13.0 - 17.0 g/dL   HCT 32.8 (L) 39.0 - 52.0 %   MCV 101.2 (H) 80.0 - 100.0 fL   MCH 31.2 26.0 - 34.0 pg   MCHC 30.8 30.0 - 36.0 g/dL   RDW 16.5 (H) 11.5 - 15.5 %   Platelets 300 150 - 400 K/uL   nRBC 0.0 0.0 - 0.2 %   Neutrophils Relative % 69 %  Neutro Abs 8.0 (H) 1.7 - 7.7 K/uL   Lymphocytes Relative 18 %   Lymphs Abs 2.1 0.7 - 4.0 K/uL    Monocytes Relative 9 %   Monocytes Absolute 1.1 (H) 0.1 - 1.0 K/uL   Eosinophils Relative 2 %   Eosinophils Absolute 0.3 0.0 - 0.5 K/uL   Basophils Relative 1 %   Basophils Absolute 0.1 0.0 - 0.1 K/uL   Immature Granulocytes 1 %   Abs Immature Granulocytes 0.09 (H) 0.00 - 0.07 K/uL    Comment: Performed at [facility], [address] [place], [place] [address]  Comprehensive metabolic panel     Status: Abnormal   Collection Time: 04/04/18  5:34 AM  Result Value Ref Range   Sodium 140 135 - 145 mmol/L   Potassium 5.4 (H) 3.5 - 5.1 mmol/L   Chloride 100 98 - 111 mmol/L   CO2 24 22 - 32 mmol/L   Glucose, Bld 82 70 - 99 mg/dL   BUN 59 (H) 8 - 23 mg/dL   Creatinine, Ser 9.50 (H) 0.61 - 1.24 mg/dL   Calcium 10.0 8.9 - 10.3 mg/dL   Total Protein 6.8 6.5 - 8.1 g/dL   Albumin 2.9 (L) 3.5 - 5.0 g/dL   AST 18 15 - 41 U/L   ALT 5 0 - 44 U/L   Alkaline Phosphatase 70 38 - 126 U/L   Total Bilirubin 0.4 0.3 - 1.2 mg/dL   GFR calc non Af Amer 5 (L) >60 mL/min   GFR calc Af Amer 6 (L) >60 mL/min   Anion gap 16 (H) 5 - 15    Comment: Performed at [facility], [address] [place], [place] [address]  Troponin I - Once     Status: Abnormal   Collection Time: 04/04/18  5:34 AM  Result Value Ref Range   Troponin I 0.05 (HH) <0.03 ng/mL    Comment: CRITICAL RESULT CALLED TO, READ BACK BY AND VERIFIED WITH HENRY RIVERA ON 04/04/18 AT 4562 QSD Performed at [facility], [address] [place], [place] [address]   Culture, blood (routine x 2)     Status: None   Collection Time: 04/04/18  5:34 AM  Result Value Ref Range   Specimen Description BLOOD BLOOD RIGHT HAND    Special Requests      BOTTLES DRAWN AEROBIC AND ANAEROBIC Blood Culture adequate volume   Culture      NO GROWTH 5 DAYS Performed at [facility], [address] [place], [place] [address]    Report Status 04/09/2018 FINAL   Culture, blood (routine x 2)     Status: None    Collection Time: 04/04/18  5:35 AM  Result Value Ref Range   Specimen Description BLOOD BLOOD LEFT HAND    Special Requests      BOTTLES DRAWN AEROBIC AND ANAEROBIC Blood Culture adequate volume   Culture      NO GROWTH 5 DAYS Performed at [facility], [address] [place], [place] [address]    Report Status 04/09/2018 FINAL   CG4 I-STAT (Lactic acid)     Status: None   Collection Time: 04/04/18  5:57 AM  Result Value Ref Range   Lactic Acid, Venous 1.28 0.5 - 1.9 mmol/L  Ammonia     Status: None   Collection Time: 04/04/18  6:24 AM  Result Value Ref Range   Ammonia 21 9 - 35 umol/L    Comment: Performed at [facility],  [place], [place] [address]  Troponin I - Once-Timed     Status: Abnormal   Collection Time: 04/04/18  8:30 AM  Result Value Ref Range   Troponin I 0.05 (HH) <0.03 ng/mL    Comment: CRITICAL VALUE NOTED.  VALUE IS CONSISTENT WITH PREVIOUSLY REPORTED AND CALLED VALUE. TTG Performed at [facility], [address] [place], [place] [address]   MRSA PCR Screening     Status: None   Collection Time: 04/04/18  6:22 PM  Result Value Ref Range   MRSA by PCR NEGATIVE NEGATIVE    Comment:        The GeneXpert MRSA Assay (FDA approved for NASAL specimens only), is one component of a comprehensive MRSA colonization surveillance program. It is not intended to diagnose MRSA infection nor to guide or monitor treatment for MRSA infections. Performed at [facility], [address] [place], [place] [address]   Basic metabolic panel     Status: Abnormal   Collection Time: 04/05/18  5:30 AM  Result Value Ref Range   Sodium 138 135 - 145 mmol/L   Potassium 4.3 3.5 - 5.1 mmol/L   Chloride 100 98 - 111 mmol/L   CO2 26 22 - 32 mmol/L   Glucose, Bld 68 (L) 70 - 99 mg/dL   BUN 24 (H) 8 - 23 mg/dL   Creatinine, Ser 5.44 (H) 0.61 - 1.24 mg/dL   Calcium 9.1 8.9 - 10.3 mg/dL   GFR calc non Af Amer 10 (L) >60 mL/min    GFR calc Af Amer 11 (L) >60 mL/min   Anion gap 12 5 - 15    Comment: Performed at [facility], [address] [place], [place] [address]  CBC     Status: Abnormal   Collection Time: 04/05/18  5:30 AM  Result Value Ref Range   WBC 12.4 (H) 4.0 - 10.5 K/uL   RBC 3.25 (L) 4.22 - 5.81 MIL/uL   Hemoglobin 9.9 (L) 13.0 - 17.0 g/dL   HCT 32.6 (L) 39.0 - 52.0 %   MCV 100.3 (H) 80.0 - 100.0 fL   MCH 30.5 26.0 - 34.0 pg   MCHC 30.4 30.0 - 36.0 g/dL   RDW 16.4 (H) 11.5 - 15.5 %   Platelets 216 150 - 400 K/uL   nRBC 0.0 0.0 - 0.2 %    Comment: Performed at [facility], [address] [place], [place] [address]  CBC     Status: Abnormal   Collection Time: 04/06/18  3:42 AM  Result Value Ref Range   WBC 14.0 (H) 4.0 - 10.5 K/uL   RBC 3.29 (L) 4.22 - 5.81 MIL/uL   Hemoglobin 10.1 (L) 13.0 - 17.0 g/dL   HCT 33.1 (L) 39.0 - 52.0 %   MCV 100.6 (H) 80.0 - 100.0 fL   MCH 30.7 26.0 - 34.0 pg   MCHC 30.5 30.0 - 36.0 g/dL   RDW 16.1 (H) 11.5 - 15.5 %   Platelets 238 150 - 400 K/uL   nRBC 0.0 0.0 - 0.2 %    Comment: Performed at [facility], [address] [place], [place] [address]  CBC     Status: Abnormal   Collection Time: 04/07/18  5:39 AM  Result Value Ref Range   WBC 13.3 (H) 4.0 - 10.5 K/uL   RBC 3.34 (L) 4.22 - 5.81 MIL/uL   Hemoglobin 10.2 (L) 13.0 - 17.0 g/dL   HCT 33.6 (L) 39.0 -  52.0 %   MCV 100.6 (H) 80.0 - 100.0 fL   MCH 30.5 26.0 - 34.0 pg   MCHC 30.4 30.0 - 36.0 g/dL   RDW 15.9 (H) 11.5 - 15.5 %   Platelets 247 150 - 400 K/uL   nRBC 0.0 0.0 - 0.2 %    Comment: Performed at [facility], [address] [place], [place] [address]  Basic metabolic panel     Status: Abnormal   Collection Time: 04/07/18  5:39 AM  Result Value Ref Range   Sodium 137 135 - 145 mmol/L   Potassium 3.6 3.5 - 5.1 mmol/L   Chloride 97 (L) 98 - 111 mmol/L   CO2 30 22 - 32 mmol/L   Glucose, Bld 82 70 - 99 mg/dL   BUN 15 8 - 23 mg/dL   Creatinine, Ser  4.36 (H) 0.61 - 1.24 mg/dL   Calcium 8.9 8.9 - 10.3 mg/dL   GFR calc non Af Amer 13 (L) >60 mL/min   GFR calc Af Amer 15 (L) >60 mL/min   Anion gap 10 5 - 15    Comment: Performed at [facility], [address] [place], [place] [address]  CBC     Status: Abnormal   Collection Time: 04/09/18  5:50 AM  Result Value Ref Range   WBC 12.3 (H) 4.0 - 10.5 K/uL   RBC 3.28 (L) 4.22 - 5.81 MIL/uL   Hemoglobin 10.1 (L) 13.0 - 17.0 g/dL   HCT 33.0 (L) 39.0 - 52.0 %   MCV 100.6 (H) 80.0 - 100.0 fL   MCH 30.8 26.0 - 34.0 pg   MCHC 30.6 30.0 - 36.0 g/dL   RDW 16.2 (H) 11.5 - 15.5 %   Platelets 249 150 - 400 K/uL   nRBC 0.0 0.0 - 0.2 %    Comment: Performed at [facility], [address] [place], [place] [address]  Basic metabolic panel     Status: Abnormal   Collection Time: 04/09/18  5:50 AM  Result Value Ref Range   Sodium 138 135 - 145 mmol/L   Potassium 3.8 3.5 - 5.1 mmol/L   Chloride 101 98 - 111 mmol/L   CO2 27 22 - 32 mmol/L   Glucose, Bld 70 70 - 99 mg/dL   BUN 28 (H) 8 - 23 mg/dL   Creatinine, Ser 7.46 (H) 0.61 - 1.24 mg/dL    Comment: RESULTS VERIFIED BY REPEAT TESTING TTG RESULTS VERIFIED BY REPEAT TESTING    Calcium 9.2 8.9 - 10.3 mg/dL   GFR calc non Af Amer 7 (L) >60 mL/min   GFR calc Af Amer 8 (L) >60 mL/min   Anion gap 10 5 - 15    Comment: Performed at [facility], [address] [place], [place] [address]  Vancomycin, trough     Status: Abnormal   Collection Time: 04/09/18  8:14 AM  Result Value Ref Range   Vancomycin Tr 5 (L) 15 - 20 ug/mL    Comment: Performed at [facility], [address] [place],  [address]  CBC     Status: Abnormal   Collection Time: 04/10/18 10:43 AM  Result Value Ref Range   WBC 20.9 (H) 4.0 - 10.5 K/uL   RBC 3.07 (L) 4.22 - 5.81 MIL/uL   Hemoglobin 9.7 (L) 13.0 - 17.0 g/dL   HCT 30.7 (L) 39.0 - 52.0 %   MCV 100.0 80.0 - 100.0 fL   MCH 31.6 26.0 - 34.0 pg  MCHC 31.6 30.0 - 36.0 g/dL    RDW 16.4 (H) 11.5 - 15.5 %   Platelets 200 150 - 400 K/uL   nRBC 0.0 0.0 - 0.2 %    Comment: Performed at [facility], [address] [place], [place] [address]  Basic metabolic panel     Status: Abnormal   Collection Time: 04/10/18 10:43 AM  Result Value Ref Range   Sodium 139 135 - 145 mmol/L   Potassium 3.7 3.5 - 5.1 mmol/L   Chloride 103 98 - 111 mmol/L   CO2 26 22 - 32 mmol/L   Glucose, Bld 230 (H) 70 - 99 mg/dL   BUN 19 8 - 23 mg/dL   Creatinine, Ser 4.97 (H) 0.61 - 1.24 mg/dL   Calcium 8.8 (L) 8.9 - 10.3 mg/dL   GFR calc non Af Amer 11 (L) >60 mL/min   GFR calc Af Amer 13 (L) >60 mL/min   Anion gap 10 5 - 15    Comment: Performed at [facility], [address] [place], [place] [address]  Vancomycin, random     Status: None   Collection Time: 04/11/18  5:14 AM  Result Value Ref Range   Vancomycin Rm 18     Comment:        Random Vancomycin therapeutic range is dependent on dosage and time of specimen collection. A peak range is 20.0-40.0 ug/mL A trough range is 5.0-15.0 ug/mL        Performed at [facility], [address] [place], [place] [address]   CBC     Status: Abnormal   Collection Time: 04/11/18  5:14 AM  Result Value Ref Range   WBC 15.7 (H) 4.0 - 10.5 K/uL   RBC 2.96 (L) 4.22 - 5.81 MIL/uL   Hemoglobin 9.2 (L) 13.0 - 17.0 g/dL   HCT 29.7 (L) 39.0 - 52.0 %   MCV 100.3 (H) 80.0 - 100.0 fL   MCH 31.1 26.0 - 34.0 pg   MCHC 31.0 30.0 - 36.0 g/dL   RDW 16.4 (H) 11.5 - 15.5 %   Platelets 192 150 - 400 K/uL   nRBC 0.0 0.0 - 0.2 %    Comment: Performed at [facility], [address] [place], [place] [address]  Basic metabolic panel     Status: Abnormal   Collection Time: 04/11/18  5:14 AM  Result Value Ref Range   Sodium 142 135 - 145 mmol/L   Potassium 3.8 3.5 - 5.1 mmol/L   Chloride 105 98 - 111 mmol/L   CO2 30 22 - 32 mmol/L   Glucose, Bld 87 70 - 99 mg/dL   BUN 27 (H) 8 - 23 mg/dL   Creatinine, Ser  5.74 (H) 0.61 - 1.24 mg/dL   Calcium 9.3 8.9 - 10.3 mg/dL   GFR calc non Af Amer 9 (L) >60 mL/min   GFR calc Af Amer 11 (L) >60 mL/min   Anion gap 7 5 - 15    Comment: Performed at [facility], [address] [place], [place] [address]  Magnesium     Status: None   Collection Time: 04/11/18  5:14 AM  Result Value Ref Range   Magnesium 1.7 1.7 - 2.4 mg/dL    Comment: Performed at [facility], [address] [place], [place] [address]  Protime-INR     Status: Abnormal   Collection Time: 04/11/18  5:14 AM  Result Value Ref Range   Prothrombin Time 15.7 (H)  11.4 - 15.2 seconds   INR 1.26     Comment: Performed at [facility], [address] [place], [place] [address]  APTT     Status: Abnormal   Collection Time: 04/11/18  5:14 AM  Result Value Ref Range   aPTT 37 (H) 24 - 36 seconds    Comment:        IF BASELINE aPTT IS ELEVATED, SUGGEST PATIENT RISK ASSESSMENT BE USED TO DETERMINE APPROPRIATE ANTICOAGULANT THERAPY. Performed at [facility], [address] [place], [place] [address]   Type and screen     Status: None   Collection Time: 04/11/18  5:15 AM  Result Value Ref Range   ABO/RH(D) B NEG    Antibody Screen NEG    Sample Expiration      04/14/2018 Performed at [facility], [address] [place], [place] [address]   Phosphorus     Status: None   Collection Time: 04/11/18  5:25 PM  Result Value Ref Range   Phosphorus 2.9 2.5 - 4.6 mg/dL    Comment: Performed at [facility], [address] [place], [place] [address]  Phosphorus     Status: None   Collection Time: 04/12/18  6:00 AM  Result Value Ref Range   Phosphorus 2.6 2.5 - 4.6 mg/dL    Comment: Performed at [facility], [address] [place], [place] [address]  CBC     Status: Abnormal   Collection Time: 04/12/18  6:00 AM  Result Value Ref Range   WBC 19.4 (H) 4.0 - 10.5 K/uL   RBC 3.01 (L) 4.22 - 5.81 MIL/uL   Hemoglobin 9.3  (L) 13.0 - 17.0 g/dL   HCT 30.6 (L) 39.0 - 52.0 %   MCV 101.7 (H) 80.0 - 100.0 fL   MCH 30.9 26.0 - 34.0 pg   MCHC 30.4 30.0 - 36.0 g/dL   RDW 16.5 (H) 11.5 - 15.5 %   Platelets 142 (L) 150 - 400 K/uL   nRBC 0.0 0.0 - 0.2 %    Comment: Performed at [facility], [address] [place], [place] [address]  Glucose, capillary     Status: None   Collection Time: 04/12/18  7:21 AM  Result Value Ref Range   Glucose-Capillary 73 70 - 99 mg/dL  Vancomycin, random     Status: None   Collection Time: 04/13/18  3:53 AM  Result Value Ref Range   Vancomycin Rm 14     Comment:        Random Vancomycin therapeutic range is dependent on dosage and time of specimen collection. A peak range is 20.0-40.0 ug/mL A trough range is 5.0-15.0 ug/mL        Performed at [facility], [address] [place], [place] [address]   CBC     Status: Abnormal   Collection Time: 04/13/18  3:53 AM  Result Value Ref Range   WBC 16.7 (H) 4.0 - 10.5 K/uL   RBC 2.86 (L) 4.22 - 5.81 MIL/uL   Hemoglobin 8.9 (L) 13.0 - 17.0 g/dL   HCT 29.5 (L) 39.0 - 52.0 %   MCV 103.1 (H) 80.0 - 100.0 fL   MCH 31.1 26.0 - 34.0 pg   MCHC 30.2 30.0 - 36.0 g/dL   RDW 16.7 (H) 11.5 - 15.5 %   Platelets 135 (L) 150 - 400 K/uL   nRBC 0.0 0.0 - 0.2 %    Comment: Performed at [facility], [address] [place], [place] [address]  Basic metabolic panel     Status: Abnormal   Collection Time: 04/13/18  3:53 AM  Result Value Ref Range   Sodium 140 135 - 145 mmol/L   Potassium 3.5 3.5 - 5.1 mmol/L   Chloride 104 98 - 111 mmol/L   CO2 28 22 - 32 mmol/L   Glucose, Bld 83 70 - 99 mg/dL   BUN 20 8 - 23 mg/dL   Creatinine, Ser 5.64 (H) 0.61 - 1.24 mg/dL   Calcium 9.1 8.9 - 10.3 mg/dL   GFR calc non Af Amer 9 (L) >60 mL/min   GFR calc Af Amer 11 (L) >60 mL/min   Anion gap 8 5 - 15    Comment: Performed at [facility], [address], [place], [place] [address]  Phosphorus     Status: None    Collection Time: 04/13/18  3:53 AM  Result Value Ref Range   Phosphorus 2.8 2.5 - 4.6 mg/dL    Comment: Performed at [facility], [address] [place], [place] [address]    Radiology Ct Head Wo Contrast  Result Date: 04/04/2018 CLINICAL DATA:  Tremors and confusion EXAM: CT HEAD WITHOUT CONTRAST TECHNIQUE: Contiguous axial images were obtained from the base of the skull through the vertex without intravenous contrast. COMPARISON:  01/20/2018 MRI FINDINGS: Brain: Mild atrophic changes and chronic white matter ischemic changes are noted similar to that seen on prior exam and commensurate with the patient's given age. Vascular: No hyperdense vessel or unexpected calcification. Skull: Normal. Negative for fracture or focal lesion. Sinuses/Orbits: No acute finding. Other: None. IMPRESSION: Chronic atrophic and ischemic changes without acute intracranial abnormality Electronically Signed   By: Inez Catalina M.D.   On: 04/04/2018 07:26   Dg Chest Port 1 View  Result Date: 04/04/2018 CLINICAL DATA:  Tremors and confusion. EXAM: PORTABLE CHEST 1 VIEW COMPARISON:  Radiographs 02/28/2018 FINDINGS: Right-sided dialysis catheter in the mid SVC. Upper normal heart size with mild aortic tortuosity. Aortic atherosclerosis. No focal airspace disease, pulmonary edema, pleural effusion or pneumothorax. Vascular stents in the left axilla. IMPRESSION: No acute pulmonary process. Electronically Signed   By: Keith Rake M.D.   On: 04/04/2018 06:10    Assessment/Plan HTN (hypertension) blood pressure control important in reducing the progression of atherosclerotic disease. On appropriate oral medications.   ESRD on dialysis [facility]) Access working well. Increases the risk of wound healing issues.   Hyperlipidemia lipid control important in reducing the progression of atherosclerotic disease. Continue statin therapy   Diabetes ([place]) blood glucose control important in reducing the progression  of atherosclerotic disease. Also, involved in wound healing. On appropriate medications.   Atherosclerosis of native arteries of the extremities with ulceration ([place]) The VAC on the right leg precluded adequate ABIs on the right but he has excellent bleeding with VAC removal.  His left ABI is noncompressible but his digit pressure is 108 and his waveforms are biphasic so his perfusion is pretty good on the left leg.  Duplex findings on the right lower extremity did not demonstrate any obvious hemodynamically significant stenosis We can recheck his perfusion in 3 months, but we appear to be in a pretty good spot for perfusion at this point.  As for his wounds, a synthetic skin graft would likely speed up the healing process of his reasonably large right calf ulceration and lower leg ulceration.  This is measuring about 12 cm by about 8 to 10 cm at this  point.  No signs of infection.  Continue local wound care on the toe ulcerations.    Leotis Pain, MD  04/24/2018 3:35 PM    This note was created with Dragon medical transcription system.  Any errors from dictation are purely unintentional

## 2018-04-24 NOTE — Assessment & Plan Note (Signed)
blood glucose control important in reducing the progression of atherosclerotic disease. Also, involved in wound healing. On appropriate medications.  

## 2018-04-24 NOTE — Assessment & Plan Note (Signed)
The VAC on the right leg precluded adequate ABIs on the right but he has excellent bleeding with VAC removal.  His left ABI is noncompressible but his digit pressure is 108 and his waveforms are biphasic so his perfusion is pretty good on the left leg.  Duplex findings on the right lower extremity did not demonstrate any obvious hemodynamically significant stenosis We can recheck his perfusion in 3 months, but we appear to be in a pretty good spot for perfusion at this point.  As for his wounds, a synthetic skin graft would likely speed up the healing process of his reasonably large right calf ulceration and lower leg ulceration.  This is measuring about 12 cm by about 8 to 10 cm at this point.  No signs of infection.  Continue local wound care on the toe ulcerations.

## 2018-04-24 NOTE — Assessment & Plan Note (Signed)
lipid control important in reducing the progression of atherosclerotic disease. Continue statin therapy  

## 2018-04-25 ENCOUNTER — Encounter: Payer: Self-pay | Admitting: *Deleted

## 2018-05-03 ENCOUNTER — Ambulatory Visit (INDEPENDENT_AMBULATORY_CARE_PROVIDER_SITE_OTHER): Payer: Medicare Other | Admitting: Nurse Practitioner

## 2018-05-09 DIAGNOSIS — G709 Myoneural disorder, unspecified: Secondary | ICD-10-CM

## 2018-05-09 DIAGNOSIS — N186 End stage renal disease: Secondary | ICD-10-CM | POA: Insufficient documentation

## 2018-05-09 HISTORY — PX: ABOVE KNEE LEG AMPUTATION: SUR20

## 2018-05-09 HISTORY — DX: Myoneural disorder, unspecified: G70.9

## 2018-05-14 ENCOUNTER — Telehealth (INDEPENDENT_AMBULATORY_CARE_PROVIDER_SITE_OTHER): Payer: Self-pay

## 2018-05-14 ENCOUNTER — Telehealth (INDEPENDENT_AMBULATORY_CARE_PROVIDER_SITE_OTHER): Payer: Self-pay | Admitting: Nurse Practitioner

## 2018-05-14 NOTE — Telephone Encounter (Signed)
We are still working on coordinating clearance from the cardiologist, as well with the company that provides the skin grafts.  Once we have everything coordinated, he can expect a call from our surgical scheduler, Mickel Baas.

## 2018-05-14 NOTE — Telephone Encounter (Signed)
Called number left in message below and it is for Johnny [name], per patients chart he has no HIPAA/DPR on file. She stated she was his medical power of attorney, I advised her to bring the paper work to office but until then I can not disclose any information to her. Called patients [phone] (mobile) no answer and left message to call our office back.

## 2018-05-14 NOTE — Telephone Encounter (Signed)
Dr. Lucky Cowboy,  [place]cl[place] from [facility] called wanted to verify most recent wound measurements of patient's calf. She was unsure of what calf she needed the measurement on. Is there any information that you could provide me with to inform her.

## 2018-05-14 NOTE — Telephone Encounter (Signed)
Please advise on below  

## 2018-05-14 NOTE — Telephone Encounter (Signed)
-----   Message from Carlena Bjornstad sent at 05/14/2018  1:19 PM EST ----- Regarding: skin graft Patient said he hasnt heard anything about his skin graft. [phone]

## 2018-05-15 ENCOUNTER — Encounter (INDEPENDENT_AMBULATORY_CARE_PROVIDER_SITE_OTHER): Payer: Self-pay

## 2018-05-15 ENCOUNTER — Telehealth (INDEPENDENT_AMBULATORY_CARE_PROVIDER_SITE_OTHER): Payer: Self-pay

## 2018-05-15 NOTE — Telephone Encounter (Signed)
Attempted to reach Johnny [name] back, no answer, left vm to call back..please see Dr. Bunnie Domino message below should she return call

## 2018-05-15 NOTE — Telephone Encounter (Signed)
I spoke with the patient yesterday and advised that he sign a consent for [name] to receive information and that she bring her POA paperwork in as well. Then we would not need to have a verbal from the patient to speak with [name]. I spoke with [name] regarding the patient's surgery and pre-op day and time. I also let her know the information will be ready for them to pick up on Friday at the patient's appt in our office.

## 2018-05-16 ENCOUNTER — Other Ambulatory Visit (INDEPENDENT_AMBULATORY_CARE_PROVIDER_SITE_OTHER): Payer: Self-pay | Admitting: Nurse Practitioner

## 2018-05-17 ENCOUNTER — Other Ambulatory Visit: Payer: Self-pay

## 2018-05-17 ENCOUNTER — Encounter
Admission: RE | Admit: 2018-05-17 | Discharge: 2018-05-17 | Disposition: A | Discharge status: Home / Self Care | Payer: Medicare Other | Source: Ambulatory Visit | Attending: Vascular Surgery | Admitting: Vascular Surgery

## 2018-05-17 DIAGNOSIS — Z01818 Encounter for other preprocedural examination: Secondary | ICD-10-CM | POA: Insufficient documentation

## 2018-05-17 HISTORY — DX: Acute myocardial infarction, unspecified: I21.9

## 2018-05-17 LAB — CBC WITH DIFFERENTIAL/PLATELET
Abs Immature Granulocytes: 0.02 10*3/uL (ref 0.00–0.07)
Basophils Absolute: 0.1 10*3/uL (ref 0.0–0.1)
Basophils Relative: 1 %
Eosinophils Absolute: 0.2 10*3/uL (ref 0.0–0.5)
Eosinophils Relative: 3 %
HCT: 35.5 % — ABNORMAL LOW (ref 39.0–52.0)
Hemoglobin: 10.5 g/dL — ABNORMAL LOW (ref 13.0–17.0)
Immature Granulocytes: 0 %
Lymphocytes Relative: 20 %
Lymphs Abs: 1.6 10*3/uL (ref 0.7–4.0)
MCH: 31.1 pg (ref 26.0–34.0)
MCHC: 29.6 g/dL — ABNORMAL LOW (ref 30.0–36.0)
MCV: 105 fL — ABNORMAL HIGH (ref 80.0–100.0)
MONO ABS: 0.7 10*3/uL (ref 0.1–1.0)
Monocytes Relative: 9 %
NEUTROS ABS: 5.3 10*3/uL (ref 1.7–7.7)
Neutrophils Relative %: 67 %
Platelets: 266 10*3/uL (ref 150–400)
RBC: 3.38 MIL/uL — ABNORMAL LOW (ref 4.22–5.81)
RDW: 14.9 % (ref 11.5–15.5)
WBC: 8 10*3/uL (ref 4.0–10.5)
nRBC: 0 % (ref 0.0–0.2)

## 2018-05-17 LAB — BASIC METABOLIC PANEL
ANION GAP: 9 (ref 5–15)
BUN: 27 mg/dL — ABNORMAL HIGH (ref 8–23)
CO2: 26 mmol/L (ref 22–32)
Calcium: 10.3 mg/dL (ref 8.9–10.3)
Chloride: 103 mmol/L (ref 98–111)
Creatinine, Ser: 5.3 mg/dL — ABNORMAL HIGH (ref 0.61–1.24)
GFR calc Af Amer: 12 mL/min — ABNORMAL LOW (ref >60)
GFR, EST NON AFRICAN AMERICAN: 10 mL/min — AB (ref >60)
Glucose, Bld: 86 mg/dL (ref 70–99)
Potassium: 3.9 mmol/L (ref 3.5–5.1)
Sodium: 138 mmol/L (ref 135–145)

## 2018-05-17 LAB — TYPE AND SCREEN
ABO/RH(D): B NEG
Antibody Screen: NEGATIVE

## 2018-05-17 LAB — APTT: aPTT: 37 seconds — ABNORMAL HIGH (ref 24–36)

## 2018-05-17 LAB — PROTIME-INR
INR: 1.12
Prothrombin Time: 14.3 seconds (ref 11.4–15.2)

## 2018-05-17 NOTE — Care Management (Signed)
EKG reviewed. Poor R wave progression in anterior leads. No change from previous. No further testing or consults required.

## 2018-05-17 NOTE — Patient Instructions (Signed)
Your procedure is scheduled on: 05/24/18 Report to Day Surgery.MEDICAL [facility] SECOND FLOOR To find out your arrival time please call [phone] between 1PM - 3PM on 05/20/18.  Remember: Instructions that are not followed completely may result in serious medical risk,  up to and including death, or upon the discretion of your surgeon and anesthesiologist your  surgery may need to be rescheduled.     _X__ 1. Do not eat food after midnight the night before your procedure.                 No gum chewing or hard candies. You may drink clear liquids up to 2 hours                 before you are scheduled to arrive for your surgery- DO not drink clear                 liquids within 2 hours of the start of your surgery.                 Clear Liquids include:  water, apple juice without pulp, clear carbohydrate                 drink such as Clearfast of Gatorade, Black Coffee or Tea (Do not add                 anything to coffee or tea).  __X__2.  On the morning of surgery brush your teeth with toothpaste and water, you                may rinse your mouth with mouthwash if you wish.  Do not swallow any toothpaste of mouthwash.     _X__ 3.  No Alcohol for 24 hours before or after surgery.   _X__ 4.  Do Not Smoke or use e-cigarettes For 24 Hours Prior to Your Surgery.                 Do not use any chewable tobacco products for at least 6 hours prior to                 surgery.  ____  5.  Bring all medications with you on the day of surgery if instructed.   ____  6.  Notify your doctor if there is any change in your medical condition      (cold, fever, infections).     Do not wear jewelry, make-up, hairpins, clips or nail polish. Do not wear lotions, powders, or perfumes. You may wear deodorant. Do not shave 48 hours prior to surgery. Men may shave face and neck. Do not bring valuables to the hospital.    [facility] is not responsible for any belongings or  valuables.  Contacts, dentures or bridgework may not be worn into surgery. Leave your suitcase in the car. After surgery it may be brought to your room. For patients admitted to the hospital, discharge time is determined by your treatment team.   Patients discharged the day of surgery will not be allowed to drive home.   Please read over the following fact sheets that you were given:   Surgical Site Infection Prevention    __X__ Take these medicines the morning of surgery with A SIP OF WATER:    1. ALLOPURINOL  2. NEURONTIN  3.   4.  5.  6.  ____ Fleet Enema (as directed)   _X___ Use CHG Soap as directed  ____ Use inhalers on the day of surgery  ____ Stop metformin 2 days prior to surgery    ____ Take 1/2 of usual insulin dose the night before surgery. No insulin the morning          of surgery.   ____ Stop Coumadin/Plavix/aspirin on  ____ Stop Anti-inflammatories on   __X__ Stop supplements until after surgery.   STOP MELATONON  UNTIL AFTER SURGERY  ____ Bring C-Pap to the hospital.      PLEASE CONTACT DR DEW'S OFFICE FOR INSTRUCTION ABOUT ASPIRIN / ELIQUIS/ PLAVIX  [phone]

## 2018-05-17 NOTE — Pre-Procedure Instructions (Signed)
PREOP INSTRUCTIONS FAXED TO [facility]

## 2018-05-18 ENCOUNTER — Other Ambulatory Visit (INDEPENDENT_AMBULATORY_CARE_PROVIDER_SITE_OTHER): Payer: Medicare Other

## 2018-05-18 ENCOUNTER — Ambulatory Visit (INDEPENDENT_AMBULATORY_CARE_PROVIDER_SITE_OTHER): Payer: Medicare Other | Admitting: Nurse Practitioner

## 2018-05-18 ENCOUNTER — Encounter (INDEPENDENT_AMBULATORY_CARE_PROVIDER_SITE_OTHER): Payer: Medicare Other

## 2018-05-18 NOTE — Pre-Procedure Instructions (Signed)
MetB, CBC,  & PTT sent to Dr. Lucky Cowboy and Anesthesia for review.

## 2018-05-22 ENCOUNTER — Other Ambulatory Visit (INDEPENDENT_AMBULATORY_CARE_PROVIDER_SITE_OTHER): Payer: Self-pay | Admitting: Vascular Surgery

## 2018-05-22 DIAGNOSIS — N186 End stage renal disease: Secondary | ICD-10-CM

## 2018-05-23 ENCOUNTER — Ambulatory Visit (INDEPENDENT_AMBULATORY_CARE_PROVIDER_SITE_OTHER): Payer: Medicare Other

## 2018-05-23 ENCOUNTER — Ambulatory Visit (INDEPENDENT_AMBULATORY_CARE_PROVIDER_SITE_OTHER): Payer: Medicare Other | Admitting: Nurse Practitioner

## 2018-05-23 DIAGNOSIS — N186 End stage renal disease: Secondary | ICD-10-CM

## 2018-05-23 MED ORDER — CEFAZOLIN SODIUM-DEXTROSE 1-4 GM/50ML-% IV SOLN
1.0000 g | INTRAVENOUS | Status: AC
  Administered 2018-05-24: 1 g via INTRAVENOUS

## 2018-05-24 ENCOUNTER — Encounter
Admission: RE | Disposition: A | Discharge status: Home / Self Care | Payer: Self-pay | Source: Home / Self Care | Attending: Vascular Surgery

## 2018-05-24 ENCOUNTER — Other Ambulatory Visit: Payer: Self-pay

## 2018-05-24 ENCOUNTER — Ambulatory Visit (INDEPENDENT_AMBULATORY_CARE_PROVIDER_SITE_OTHER): Payer: Medicare Other | Admitting: Nurse Practitioner

## 2018-05-24 ENCOUNTER — Ambulatory Visit
Admission: RE | Admit: 2018-05-24 | Discharge: 2018-05-24 | Disposition: A | Discharge status: Home / Self Care | Payer: Medicare Other | Attending: Vascular Surgery | Admitting: Vascular Surgery

## 2018-05-24 ENCOUNTER — Encounter: Payer: Self-pay | Admitting: Anesthesiology

## 2018-05-24 ENCOUNTER — Encounter (INDEPENDENT_AMBULATORY_CARE_PROVIDER_SITE_OTHER): Payer: Medicare Other

## 2018-05-24 ENCOUNTER — Other Ambulatory Visit (INDEPENDENT_AMBULATORY_CARE_PROVIDER_SITE_OTHER): Payer: Medicare Other

## 2018-05-24 ENCOUNTER — Ambulatory Visit: Payer: Medicare Other | Admitting: Anesthesiology

## 2018-05-24 DIAGNOSIS — X58XXXA Exposure to other specified factors, initial encounter: Secondary | ICD-10-CM | POA: Insufficient documentation

## 2018-05-24 DIAGNOSIS — M109 Gout, unspecified: Secondary | ICD-10-CM | POA: Diagnosis not present

## 2018-05-24 DIAGNOSIS — D631 Anemia in chronic kidney disease: Secondary | ICD-10-CM | POA: Insufficient documentation

## 2018-05-24 DIAGNOSIS — Z91013 Allergy to seafood: Secondary | ICD-10-CM | POA: Diagnosis not present

## 2018-05-24 DIAGNOSIS — E785 Hyperlipidemia, unspecified: Secondary | ICD-10-CM | POA: Insufficient documentation

## 2018-05-24 DIAGNOSIS — N189 Chronic kidney disease, unspecified: Secondary | ICD-10-CM | POA: Insufficient documentation

## 2018-05-24 DIAGNOSIS — Z7982 Long term (current) use of aspirin: Secondary | ICD-10-CM | POA: Diagnosis not present

## 2018-05-24 DIAGNOSIS — F419 Anxiety disorder, unspecified: Secondary | ICD-10-CM | POA: Insufficient documentation

## 2018-05-24 DIAGNOSIS — Z79899 Other long term (current) drug therapy: Secondary | ICD-10-CM | POA: Insufficient documentation

## 2018-05-24 DIAGNOSIS — I130 Hypertensive heart and chronic kidney disease with heart failure and stage 1 through stage 4 chronic kidney disease, or unspecified chronic kidney disease: Secondary | ICD-10-CM | POA: Diagnosis not present

## 2018-05-24 DIAGNOSIS — E1122 Type 2 diabetes mellitus with diabetic chronic kidney disease: Secondary | ICD-10-CM | POA: Insufficient documentation

## 2018-05-24 DIAGNOSIS — I739 Peripheral vascular disease, unspecified: Secondary | ICD-10-CM | POA: Diagnosis not present

## 2018-05-24 DIAGNOSIS — I509 Heart failure, unspecified: Secondary | ICD-10-CM | POA: Insufficient documentation

## 2018-05-24 DIAGNOSIS — Z8249 Family history of ischemic heart disease and other diseases of the circulatory system: Secondary | ICD-10-CM | POA: Diagnosis not present

## 2018-05-24 DIAGNOSIS — S81801A Unspecified open wound, right lower leg, initial encounter: Secondary | ICD-10-CM

## 2018-05-24 DIAGNOSIS — I252 Old myocardial infarction: Secondary | ICD-10-CM | POA: Diagnosis not present

## 2018-05-24 DIAGNOSIS — Z87891 Personal history of nicotine dependence: Secondary | ICD-10-CM | POA: Insufficient documentation

## 2018-05-24 HISTORY — PX: SKIN SPLIT GRAFT: SHX444

## 2018-05-24 LAB — POCT I-STAT 4, (NA,K, GLUC, HGB,HCT)
GLUCOSE: 73 mg/dL (ref 70–99)
HEMATOCRIT: 39 % (ref 39.0–52.0)
Hemoglobin: 13.3 g/dL (ref 13.0–17.0)
Potassium: 3.9 mmol/L (ref 3.5–5.1)
Sodium: 137 mmol/L (ref 135–145)

## 2018-05-24 SURGERY — APPLICATION, GRAFT, SKIN, SPLIT-THICKNESS
Anesthesia: General | Laterality: Right

## 2018-05-24 MED ORDER — FENTANYL CITRATE (PF) 100 MCG/2ML IJ SOLN
INTRAMUSCULAR | Status: AC
  Administered 2018-05-24: 25 ug via INTRAVENOUS
  Filled 2018-05-24: qty 2

## 2018-05-24 MED ORDER — FAMOTIDINE 20 MG PO TABS
ORAL_TABLET | ORAL | Status: AC
  Administered 2018-05-24: 20 mg via ORAL
  Filled 2018-05-24: qty 1

## 2018-05-24 MED ORDER — MIDAZOLAM HCL 2 MG/2ML IJ SOLN
INTRAMUSCULAR | Status: DC | PRN
  Administered 2018-05-24: 1 mg via INTRAVENOUS

## 2018-05-24 MED ORDER — GLYCOPYRROLATE 0.2 MG/ML IJ SOLN
INTRAMUSCULAR | Status: AC
  Filled 2018-05-24: qty 1

## 2018-05-24 MED ORDER — ONDANSETRON HCL 4 MG/2ML IJ SOLN
INTRAMUSCULAR | Status: AC
  Filled 2018-05-24: qty 2

## 2018-05-24 MED ORDER — SODIUM CHLORIDE 0.9 % IV SOLN
INTRAVENOUS | Status: DC
  Administered 2018-05-24: 11:00:00 via INTRAVENOUS

## 2018-05-24 MED ORDER — PROPOFOL 10 MG/ML IV BOLUS
INTRAVENOUS | Status: DC | PRN
  Administered 2018-05-24: 100 mg via INTRAVENOUS

## 2018-05-24 MED ORDER — FENTANYL CITRATE (PF) 100 MCG/2ML IJ SOLN
25.0000 ug | INTRAMUSCULAR | Status: DC | PRN
  Administered 2018-05-24 (×4): 25 ug via INTRAVENOUS

## 2018-05-24 MED ORDER — FENTANYL CITRATE (PF) 100 MCG/2ML IJ SOLN
INTRAMUSCULAR | Status: AC
  Filled 2018-05-24: qty 2

## 2018-05-24 MED ORDER — HYDROCODONE-ACETAMINOPHEN 5-325 MG PO TABS: 1.0000 | ORAL_TABLET | ORAL | 0 refills | Status: DC | PRN

## 2018-05-24 MED ORDER — GLYCOPYRROLATE 0.2 MG/ML IJ SOLN
INTRAMUSCULAR | Status: DC | PRN
  Administered 2018-05-24: 0.2 mg via INTRAVENOUS

## 2018-05-24 MED ORDER — LABETALOL HCL 5 MG/ML IV SOLN
INTRAVENOUS | Status: AC
  Administered 2018-05-24: 5 mg via INTRAVENOUS
  Filled 2018-05-24: qty 4

## 2018-05-24 MED ORDER — FENTANYL CITRATE (PF) 100 MCG/2ML IJ SOLN
INTRAMUSCULAR | Status: DC | PRN
  Administered 2018-05-24 (×4): 25 ug via INTRAVENOUS

## 2018-05-24 MED ORDER — MIDAZOLAM HCL 2 MG/2ML IJ SOLN
INTRAMUSCULAR | Status: AC
  Filled 2018-05-24: qty 2

## 2018-05-24 MED ORDER — HYDROMORPHONE HCL 1 MG/ML IJ SOLN: 1.0000 mg | Freq: Once | INTRAMUSCULAR | Status: DC | PRN

## 2018-05-24 MED ORDER — FAMOTIDINE 20 MG PO TABS
20.0000 mg | ORAL_TABLET | Freq: Once | ORAL | Status: AC
  Administered 2018-05-24: 20 mg via ORAL

## 2018-05-24 MED ORDER — ONDANSETRON HCL 4 MG/2ML IJ SOLN: 4.0000 mg | Freq: Four times a day (QID) | INTRAMUSCULAR | Status: DC | PRN

## 2018-05-24 MED ORDER — PHENYLEPHRINE HCL 10 MG/ML IJ SOLN
INTRAMUSCULAR | Status: DC | PRN
  Administered 2018-05-24: 100 ug via INTRAVENOUS
  Administered 2018-05-24: 150 ug via INTRAVENOUS

## 2018-05-24 MED ORDER — ONDANSETRON HCL 4 MG/2ML IJ SOLN: 4.0000 mg | Freq: Once | INTRAMUSCULAR | Status: DC | PRN

## 2018-05-24 MED ORDER — FAMOTIDINE 20 MG PO TABS
ORAL_TABLET | ORAL | Status: AC
  Filled 2018-05-24: qty 1

## 2018-05-24 MED ORDER — MINERAL OIL LIGHT 100 % EX OIL
TOPICAL_OIL | CUTANEOUS | Status: AC
  Filled 2018-05-24: qty 25

## 2018-05-24 MED ORDER — CEFAZOLIN SODIUM-DEXTROSE 1-4 GM/50ML-% IV SOLN
INTRAVENOUS | Status: AC
  Filled 2018-05-24: qty 50

## 2018-05-24 MED ORDER — LABETALOL HCL 5 MG/ML IV SOLN
5.0000 mg | INTRAVENOUS | Status: AC | PRN
  Administered 2018-05-24 (×2): 5 mg via INTRAVENOUS

## 2018-05-24 MED ORDER — LIDOCAINE HCL (PF) 2 % IJ SOLN
INTRAMUSCULAR | Status: AC
  Filled 2018-05-24: qty 10

## 2018-05-24 MED ORDER — PROPOFOL 10 MG/ML IV BOLUS
INTRAVENOUS | Status: AC
  Filled 2018-05-24: qty 20

## 2018-05-24 MED ORDER — ONDANSETRON HCL 4 MG/2ML IJ SOLN
INTRAMUSCULAR | Status: DC | PRN
  Administered 2018-05-24: 4 mg via INTRAVENOUS

## 2018-05-24 MED ORDER — LIDOCAINE HCL (CARDIAC) PF 100 MG/5ML IV SOSY
PREFILLED_SYRINGE | INTRAVENOUS | Status: DC | PRN
  Administered 2018-05-24: 60 mg via INTRAVENOUS

## 2018-05-24 SURGICAL SUPPLY — 30 items
BLADE CLIPPER SURG (BLADE) ×1 IMPLANT
BLADE DERMATOME SS (BLADE) ×1 IMPLANT
BNDG GAUZE 4.5X4.1 6PLY STRL (MISCELLANEOUS) ×1 IMPLANT
CHLORAPREP W/TINT 26ML (MISCELLANEOUS) ×2 IMPLANT
COVER WAND RF STERILE (DRAPES) ×2 IMPLANT
DERMACARRIER 1-1.5 (MISCELLANEOUS) ×1 IMPLANT
DERMACARRIER 3-1 (MISCELLANEOUS) ×1 IMPLANT
DRAPE EXTREMITY 106X87X128.5 (DRAPES) ×1 IMPLANT
DRAPE IMP U-DRAPE 54X76 (DRAPES) ×1 IMPLANT
DRAPE LAPAROTOMY 100X77 ABD (DRAPES) ×1 IMPLANT
DRAPE SHEET LG 3/4 BI-LAMINATE (DRAPES) ×2 IMPLANT
DRSG EMULSION OIL 3X8 NADH (GAUZE/BANDAGES/DRESSINGS) ×2 IMPLANT
DRSG TEGADERM 6X8 (GAUZE/BANDAGES/DRESSINGS) ×1 IMPLANT
DRSG TELFA 3X8 NADH (GAUZE/BANDAGES/DRESSINGS) IMPLANT
ELECT REM PT RETURN 9FT ADLT (ELECTROSURGICAL) ×2
ELECTRODE REM PT RTRN 9FT ADLT (ELECTROSURGICAL) ×1 IMPLANT
GAUZE SPONGE 4X4 12PLY STRL (GAUZE/BANDAGES/DRESSINGS) ×1 IMPLANT
GLOVE BIO SURGEON STRL SZ7 (GLOVE) ×4 IMPLANT
GLOVE INDICATOR 7.5 STRL GRN (GLOVE) ×2 IMPLANT
GOWN STRL REUS W/ TWL LRG LVL3 (GOWN DISPOSABLE) ×3 IMPLANT
GOWN STRL REUS W/TWL LRG LVL3 (GOWN DISPOSABLE) ×3
GRAFT AMNIOTIC 6.0X3.0CM (Orthopedic Graft) ×3 IMPLANT
KIT TURNOVER KIT A (KITS) ×2 IMPLANT
LABEL OR SOLS (LABEL) ×1 IMPLANT
NS IRRIG 500ML POUR BTL (IV SOLUTION) ×2 IMPLANT
PACK BASIN MINOR ARMC (MISCELLANEOUS) ×2 IMPLANT
PAD DRESSING TELFA 3X8 NADH (GAUZE/BANDAGES/DRESSINGS) ×1 IMPLANT
SPONGE LAP 18X18 RF (DISPOSABLE) ×2 IMPLANT
STOCKINETTE STRL 6IN 960660 (GAUZE/BANDAGES/DRESSINGS) ×1 IMPLANT
SUT ETHILON 5-0 FS-2 18 BLK (SUTURE) ×2 IMPLANT

## 2018-05-24 NOTE — H&P (Signed)
[facility] History & Physical Update  The patient was interviewed and re-examined.  The patient's previous History and Physical has been reviewed and is unchanged.  There is no change in the plan of care. We plan to proceed with the scheduled procedure.  Leotis Pain, MD  05/24/2018, 11:14 AM

## 2018-05-24 NOTE — Op Note (Signed)
[facility]   OPERATIVE NOTE  DATE: 05/24/2018  PRE-OPERATIVE DIAGNOSIS: Right posterior calf wound  POST-OPERATIVE DIAGNOSIS: Same as above  PROCEDURE: 1.   Synthetic skin graft to the right posterior calf using a total of 3 amniotic skin grafts measuring 6 cm x 3 cm in size each (total of 54 cm)  SURGEON: Leotis Pain, MD  ASSISTANT(S): Hezzie Bump  ANESTHESIA: Gen  ESTIMATED BLOOD LOSS: 5 cc  FINDING(S): 1.  Clean healthy wound with good granulation tissue  SPECIMEN(S):  None  INDICATIONS:   Patient is a 68 y.o.male who presents with a large chronic right calf wound. This is clean and healthy and has great granulation tissue. It is ready for skin grafting as it would take an excessive amount of time to expect this to heal on its. To avoid a donor site and to try to promote epithelialization, synthetic skin graft will be used today. Risks and benefits are discussed. An assistant was present during the procedure to help facilitate the exposure and expedite the procedure.  DESCRIPTION: After obtaining full informed written consent, the patient was brought back to the operating room and placed supine upon the operating table.  The patient received IV antibiotics prior to induction.  After obtaining adequate anesthesia, the patient was prepped and draped in the standard fashion. The assistant provided retraction and mobilization to help facilitate exposure and expedite the procedure throughout the entire procedure.  This included following suture, using retractors, and optimizing lighting. The wound was then roughed up with a gauze sponge to promote some mild bleeding. I then began fashioning the Amniox skin grafts to fit the wound.  The skin grafts were each 6 cm x 3 cm in size.  These were slightly fenestrated and a total of 3 of the skin grafts were required to fit the entirety of the wound.  There were very slight gaps between grafts.  These were  stapled down to secure them in place.  2 Adaptic's were then placed over the wound and these were stapled down to help keep the skin grafts in place.  A gauze wrap was then placed around the leg. The patient was taken to the recovery room in stable condition.            COMPLICATIONS: None  CONDITION: Stable  Leotis Pain 05/24/2018 1:25 PM    This note was created with Dragon Medical transcription system. Any errors in dictation are purely unintentional.

## 2018-05-24 NOTE — Anesthesia Preprocedure Evaluation (Addendum)
Anesthesia Evaluation  Patient identified by MRN, date of birth, ID band Patient awake    Reviewed: Allergy & Precautions, NPO status , Patient's Chart, lab work & pertinent test results, reviewed documented beta blocker date and time   Airway Mallampati: II  TM Distance: >3 FB     Dental  (+) Upper Dentures, Lower Dentures   Pulmonary former smoker,           Cardiovascular hypertension, Pt. on medications and Pt. on home beta blockers + Past MI, + Peripheral Vascular Disease and +CHF       Neuro/Psych PSYCHIATRIC DISORDERS Anxiety    GI/Hepatic   Endo/Other  diabetes, Type 2  Renal/GU ESRFRenal disease     Musculoskeletal   Abdominal   Peds  Hematology  (+) anemia ,   Anesthesia Other Findings Gout. EKG ok, poor R wave progression.  Reproductive/Obstetrics                            Anesthesia Physical Anesthesia Plan  ASA: III  Anesthesia Plan: General   Post-op Pain Management:    Induction: Intravenous  PONV Risk Score and Plan:   Airway Management Planned: LMA  Additional Equipment:   Intra-op Plan:   Post-operative Plan:   Informed Consent: I have reviewed the patients History and Physical, chart, labs and discussed the procedure including the risks, benefits and alternatives for the proposed anesthesia with the patient or authorized representative who has indicated his/her understanding and acceptance.       Plan Discussed with: CRNA  Anesthesia Plan Comments:         Anesthesia Quick Evaluation

## 2018-05-24 NOTE — Progress Notes (Signed)
Pt BP elevated 178/105. Dr. Marcello Moores notified. Acknowledged. Orders received.

## 2018-05-24 NOTE — Progress Notes (Signed)
Dr. Marcello Moores notified of Pt BP 170/88 after another 10MG  of Labetalol. Acknowledged. No new orders. Pt can go home.

## 2018-05-24 NOTE — Transfer of Care (Signed)
Immediate Anesthesia Transfer of Care Note  Patient: Johnny [name].  Procedure(s) Performed: SKIN GRAFT SPLIT THICKNESS ( RIGHT CALF) (Right )  Patient Location: PACU  Anesthesia Type:General  Level of Consciousness: drowsy  Airway & Oxygen Therapy: Patient Spontanous Breathing and Patient connected to face mask oxygen  Post-op Assessment: Report given to RN and Post -op Vital signs reviewed and stable  Post vital signs: Reviewed and stable  Last Vitals:  Vitals Value Taken Time  BP 144/91 05/24/2018  1:24 PM  Temp 36.4 C 05/24/2018  1:24 PM  Pulse 80 05/24/2018  1:27 PM  Resp 12 05/24/2018  1:27 PM  SpO2 100 % 05/24/2018  1:27 PM  Vitals shown include unvalidated device data.  Last Pain:  Vitals:   05/24/18 1324  TempSrc:   PainSc: Asleep         Complications: No apparent anesthesia complications

## 2018-05-24 NOTE — Anesthesia Procedure Notes (Signed)
Procedure Name: LMA Insertion Date/Time: 05/24/2018 12:38 PM Performed by: Jonna Clark, CRNA Pre-anesthesia Checklist: Patient identified, Patient being monitored, Timeout performed, Emergency Drugs available and Suction available Patient Re-evaluated:Patient Re-evaluated prior to induction Oxygen Delivery Method: Circle system utilized Preoxygenation: Pre-oxygenation with 100% oxygen Induction Type: IV induction Ventilation: Mask ventilation without difficulty LMA: LMA inserted LMA Size: 4.0 Tube type: Oral Number of attempts: 1 Placement Confirmation: positive ETCO2 and breath sounds checked- equal and bilateral Tube secured with: Tape Dental Injury: Teeth and Oropharynx as per pre-operative assessment

## 2018-05-24 NOTE — Progress Notes (Signed)
Dr. Marcello Moores notified that after 10 MG of Labetalol BP still 194/95. Acknowledged. Orders received.

## 2018-05-24 NOTE — Anesthesia Post-op Follow-up Note (Signed)
Anesthesia QCDR form completed.        

## 2018-05-25 ENCOUNTER — Encounter: Payer: Self-pay | Admitting: Vascular Surgery

## 2018-05-25 NOTE — Anesthesia Postprocedure Evaluation (Signed)
Anesthesia Post Note  Patient: Johnny [name].  Procedure(s) Performed: SKIN GRAFT SPLIT THICKNESS ( RIGHT CALF) (Right )  Patient location during evaluation: PACU Anesthesia Type: General Level of consciousness: awake and alert Pain management: pain level controlled Vital Signs Assessment: post-procedure vital signs reviewed and stable Respiratory status: spontaneous breathing, nonlabored ventilation, respiratory function stable and patient connected to nasal cannula oxygen Cardiovascular status: blood pressure returned to baseline and stable Postop Assessment: no apparent nausea or vomiting Anesthetic complications: no     Last Vitals:  Vitals:   05/24/18 1453 05/24/18 1530  BP: (!) 174/88 (!) 188/88  Pulse: 78 80  Resp:    Temp: (!) 36.3 C 36.8 C  SpO2: 100% 100%    Last Pain:  Vitals:   05/24/18 1530  TempSrc: Temporal  PainSc: 0-No pain                 Alyxander Kollmann S

## 2018-05-30 ENCOUNTER — Emergency Department: Payer: Medicare Other

## 2018-05-30 ENCOUNTER — Emergency Department
Admission: EM | Admit: 2018-05-30 | Discharge: 2018-05-30 | Disposition: A | Discharge status: Home / Self Care | Payer: Medicare Other | Source: Home / Self Care | Attending: Emergency Medicine | Admitting: Emergency Medicine

## 2018-05-30 ENCOUNTER — Emergency Department
Admission: EM | Admit: 2018-05-30 | Discharge: 2018-05-30 | Disposition: A | Discharge status: Home / Self Care | Payer: Medicare Other | Attending: Emergency Medicine | Admitting: Emergency Medicine

## 2018-05-30 ENCOUNTER — Telehealth (INDEPENDENT_AMBULATORY_CARE_PROVIDER_SITE_OTHER): Payer: Self-pay | Admitting: Vascular Surgery

## 2018-05-30 ENCOUNTER — Other Ambulatory Visit: Payer: Self-pay

## 2018-05-30 DIAGNOSIS — R251 Tremor, unspecified: Secondary | ICD-10-CM | POA: Diagnosis present

## 2018-05-30 DIAGNOSIS — Z7982 Long term (current) use of aspirin: Secondary | ICD-10-CM | POA: Insufficient documentation

## 2018-05-30 DIAGNOSIS — R531 Weakness: Secondary | ICD-10-CM | POA: Insufficient documentation

## 2018-05-30 DIAGNOSIS — Z7902 Long term (current) use of antithrombotics/antiplatelets: Secondary | ICD-10-CM | POA: Insufficient documentation

## 2018-05-30 DIAGNOSIS — N186 End stage renal disease: Secondary | ICD-10-CM | POA: Insufficient documentation

## 2018-05-30 DIAGNOSIS — Z992 Dependence on renal dialysis: Secondary | ICD-10-CM | POA: Insufficient documentation

## 2018-05-30 DIAGNOSIS — E1122 Type 2 diabetes mellitus with diabetic chronic kidney disease: Secondary | ICD-10-CM

## 2018-05-30 DIAGNOSIS — T148XXA Other injury of unspecified body region, initial encounter: Secondary | ICD-10-CM

## 2018-05-30 DIAGNOSIS — F419 Anxiety disorder, unspecified: Secondary | ICD-10-CM

## 2018-05-30 DIAGNOSIS — Z7901 Long term (current) use of anticoagulants: Secondary | ICD-10-CM | POA: Insufficient documentation

## 2018-05-30 DIAGNOSIS — Z79899 Other long term (current) drug therapy: Secondary | ICD-10-CM | POA: Insufficient documentation

## 2018-05-30 DIAGNOSIS — E11628 Type 2 diabetes mellitus with other skin complications: Secondary | ICD-10-CM | POA: Insufficient documentation

## 2018-05-30 DIAGNOSIS — R5383 Other fatigue: Secondary | ICD-10-CM | POA: Insufficient documentation

## 2018-05-30 DIAGNOSIS — I5032 Chronic diastolic (congestive) heart failure: Secondary | ICD-10-CM

## 2018-05-30 DIAGNOSIS — L089 Local infection of the skin and subcutaneous tissue, unspecified: Secondary | ICD-10-CM

## 2018-05-30 DIAGNOSIS — I132 Hypertensive heart and chronic kidney disease with heart failure and with stage 5 chronic kidney disease, or end stage renal disease: Secondary | ICD-10-CM

## 2018-05-30 DIAGNOSIS — Z87891 Personal history of nicotine dependence: Secondary | ICD-10-CM | POA: Insufficient documentation

## 2018-05-30 LAB — CBC WITH DIFFERENTIAL/PLATELET
ABS IMMATURE GRANULOCYTES: 0.02 10*3/uL (ref 0.00–0.07)
Basophils Absolute: 0.1 10*3/uL (ref 0.0–0.1)
Basophils Relative: 1 %
Eosinophils Absolute: 0.3 10*3/uL (ref 0.0–0.5)
Eosinophils Relative: 3 %
HCT: 33.8 % — ABNORMAL LOW (ref 39.0–52.0)
HEMOGLOBIN: 10.5 g/dL — AB (ref 13.0–17.0)
Immature Granulocytes: 0 %
Lymphocytes Relative: 11 %
Lymphs Abs: 1 10*3/uL (ref 0.7–4.0)
MCH: 30.9 pg (ref 26.0–34.0)
MCHC: 31.1 g/dL (ref 30.0–36.0)
MCV: 99.4 fL (ref 80.0–100.0)
MONO ABS: 1.3 10*3/uL — AB (ref 0.1–1.0)
Monocytes Relative: 15 %
NEUTROS ABS: 6.3 10*3/uL (ref 1.7–7.7)
Neutrophils Relative %: 70 %
Platelets: 339 10*3/uL (ref 150–400)
RBC: 3.4 MIL/uL — ABNORMAL LOW (ref 4.22–5.81)
RDW: 14 % (ref 11.5–15.5)
WBC: 8.9 10*3/uL (ref 4.0–10.5)
nRBC: 0 % (ref 0.0–0.2)

## 2018-05-30 LAB — BASIC METABOLIC PANEL
Anion gap: 15 (ref 5–15)
BUN: 59 mg/dL — ABNORMAL HIGH (ref 8–23)
CO2: 21 mmol/L — ABNORMAL LOW (ref 22–32)
Calcium: 9.4 mg/dL (ref 8.9–10.3)
Chloride: 105 mmol/L (ref 98–111)
Creatinine, Ser: 10.78 mg/dL — ABNORMAL HIGH (ref 0.61–1.24)
GFR calc Af Amer: 5 mL/min — ABNORMAL LOW (ref >60)
GFR calc non Af Amer: 4 mL/min — ABNORMAL LOW (ref >60)
Glucose, Bld: 97 mg/dL (ref 70–99)
Potassium: 5.4 mmol/L — ABNORMAL HIGH (ref 3.5–5.1)
Sodium: 141 mmol/L (ref 135–145)

## 2018-05-30 LAB — COMPREHENSIVE METABOLIC PANEL
ALT: 9 U/L (ref 0–44)
AST: 25 U/L (ref 15–41)
Albumin: 3.4 g/dL — ABNORMAL LOW (ref 3.5–5.0)
Alkaline Phosphatase: 74 U/L (ref 38–126)
Anion gap: 16 — ABNORMAL HIGH (ref 5–15)
BUN: 24 mg/dL — ABNORMAL HIGH (ref 8–23)
CO2: 26 mmol/L (ref 22–32)
Calcium: 9 mg/dL (ref 8.9–10.3)
Chloride: 96 mmol/L — ABNORMAL LOW (ref 98–111)
Creatinine, Ser: 5.72 mg/dL — ABNORMAL HIGH (ref 0.61–1.24)
GFR calc Af Amer: 11 mL/min — ABNORMAL LOW (ref >60)
GFR calc non Af Amer: 9 mL/min — ABNORMAL LOW (ref >60)
Glucose, Bld: 92 mg/dL (ref 70–99)
Potassium: 4.2 mmol/L (ref 3.5–5.1)
Sodium: 138 mmol/L (ref 135–145)
Total Bilirubin: 0.5 mg/dL (ref 0.3–1.2)
Total Protein: 7.4 g/dL (ref 6.5–8.1)

## 2018-05-30 LAB — PROTIME-INR
INR: 1.2
Prothrombin Time: 15.1 seconds (ref 11.4–15.2)

## 2018-05-30 LAB — HEPATIC FUNCTION PANEL
ALK PHOS: 79 U/L (ref 38–126)
ALT: 8 U/L (ref 0–44)
AST: 21 U/L (ref 15–41)
Albumin: 3.1 g/dL — ABNORMAL LOW (ref 3.5–5.0)
Bilirubin, Direct: 0.1 mg/dL (ref 0.0–0.2)
Indirect Bilirubin: 0.4 mg/dL (ref 0.3–0.9)
Total Bilirubin: 0.5 mg/dL (ref 0.3–1.2)
Total Protein: 6.6 g/dL (ref 6.5–8.1)

## 2018-05-30 LAB — CBC
HCT: 39 % (ref 39.0–52.0)
Hemoglobin: 12.3 g/dL — ABNORMAL LOW (ref 13.0–17.0)
MCH: 31.1 pg (ref 26.0–34.0)
MCHC: 31.5 g/dL (ref 30.0–36.0)
MCV: 98.5 fL (ref 80.0–100.0)
Platelets: 293 10*3/uL (ref 150–400)
RBC: 3.96 MIL/uL — ABNORMAL LOW (ref 4.22–5.81)
RDW: 13.9 % (ref 11.5–15.5)
WBC: 8.6 10*3/uL (ref 4.0–10.5)
nRBC: 0 % (ref 0.0–0.2)

## 2018-05-30 LAB — LACTIC ACID, PLASMA: Lactic Acid, Venous: 1.1 mmol/L (ref 0.5–1.9)

## 2018-05-30 LAB — PROCALCITONIN: Procalcitonin: 2.07 ng/mL

## 2018-05-30 LAB — INFLUENZA PANEL BY PCR (TYPE A & B)
Influenza A By PCR: NEGATIVE
Influenza B By PCR: NEGATIVE

## 2018-05-30 LAB — AMMONIA: Ammonia: 35 umol/L (ref 9–35)

## 2018-05-30 MED ORDER — SODIUM CHLORIDE 0.9 % IV SOLN
1.0000 g | Freq: Once | INTRAVENOUS | Status: AC
  Administered 2018-05-30: 1 g via INTRAVENOUS
  Filled 2018-05-30: qty 10

## 2018-05-30 MED ORDER — LORAZEPAM 2 MG/ML IJ SOLN
2.0000 mg | Freq: Once | INTRAMUSCULAR | Status: AC
  Administered 2018-05-30: 2 mg via INTRAVENOUS
  Filled 2018-05-30: qty 1

## 2018-05-30 MED ORDER — AMOXICILLIN-POT CLAVULANATE 875-125 MG PO TABS: 1.0000 | ORAL_TABLET | Freq: Two times a day (BID) | ORAL | 0 refills | Status: AC

## 2018-05-30 NOTE — ED Provider Notes (Addendum)
[facility] Emergency Department Provider Note  Time seen: 12:52 PM  I have reviewed the triage vital signs and the nursing notes.   HISTORY  Chief Complaint Tremors    HPI Johnny Yordy. is a 68 y.o. male with a past medical history of anxiety, CHF, ESRD on HD, hypertension, presents to the emergency department for tremors.  According to EMS the patient had just finished his session of dialysis when he developed shaking.  Upon arrival to the emergency department the patient is somnolent, but will awaken to voice and answer questions, follow commands then falls back asleep.  Patient was seen in the emergency department overnight last night, states he is very tired.  Patient states it was his "anxiety acting up."  States when he gets very anxious he will start shaking uncontrollably followed by significant fatigue and weakness.  Patient denies any acute complaints.  Denies any chest pain, abdominal pain or shortness of breath.  Largely negative review of systems.   Past Medical History:  Diagnosis Date  . Anemia   . Anxiety   . CHF (congestive heart failure) ([place])   . Chronic kidney disease    esrd dialysis m/w/f  . Gout   . Hyperlipidemia   . Hypertension   . Myocardial infarction ([place])    10 years ago  . Peripheral vascular disease [facility])     Patient Active Problem List   Diagnosis Date Noted  . Malnutrition of moderate degree 04/11/2018  . Pressure injury of skin 04/06/2018  . Altered mental status 04/04/2018  . Hypothermia 04/04/2018  . Hyperlipidemia 02/27/2018  . Diabetes ([place]) 02/27/2018  . Weakness of right lower extremity 01/20/2018  . Fever   . Periumbilical abdominal pain   . Confusion 12/22/2017  . Acute delirium 12/21/2017  . Protein-calorie malnutrition, severe 12/19/2017  . Intractable nausea and vomiting 12/18/2017  . Lymphedema 12/13/2017  . Cellulitis 11/27/2017  . Chest pain 11/19/2017  . 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 Elevated troponin 10/02/2015  . Complications, dialysis, catheter, mechanical ([place]) 10/02/2015  . Musculoskeletal chest pain 09/28/2015  . ESRD on dialysis ([place]) 09/28/2015  . HTN (hypertension) 09/28/2015  . Chronic diastolic CHF (congestive heart failure) ([place]) 09/28/2015  . Gout 09/28/2015    Past Surgical History:  Procedure Laterality Date  . A/V SHUNTOGRAM Left 06/21/2017   Procedure: A/V SHUNTOGRAM;  Surgeon: Katha Cabal, MD;  Location: [place] [facility];  Service: Cardiovascular;  Laterality: Left;  . APPLICATION OF WOUND VAC Right 04/11/2018   Procedure: APPLICATION OF WOUND VAC;  Surgeon: Algernon Huxley, MD;  Location: [facility] ORS;  Service: Vascular;  Laterality: Right;  . AV FISTULA PLACEMENT Left 09/18/2015   Procedure: INSERTION OF ARTERIOVENOUS (AV) GORE-TEX GRAFT ARM ( BRACH/AXILLARY GRAFT W/ INSTANT STICK GRAFT );  Surgeon: Katha Cabal, MD;  Location: [facility] OR[facility];  Service: Vascular;  Laterality: Left;  . DIALYSIS FISTULA CREATION    . ESOPHAGOGASTRODUODENOSCOPY N/A 12/19/2017   Procedure: ESOPHAGOGASTRODUODENOSCOPY (EGD);  Surgeon: Lin Landsman, MD;  Location: [facility];  Service: Gastroenterology;  Laterality: N/A;  . LOWER EXTREMITY ANGIOGRAPHY Left 11/16/2017   Procedure: LOWER EXTREMITY ANGIOGRAPHY;  Surgeon: Algernon Huxley, MD;  Location: [place] [facility];  Service: Cardiovascular;  Laterality: Left;  . LOWER EXTREMITY ANGIOGRAPHY Right 01/18/2018   Procedure: LOWER EXTREMITY ANGIOGRAPHY;  Surgeon: Algernon Huxley, MD;  Location: [place] [facility];  Service: Cardiovascular;  Laterality: Right;  .  LOWER EXTREMITY ANGIOGRAPHY Left 04/02/2018   Procedure: LOWER EXTREMITY ANGIOGRAPHY;  Surgeon: Algernon Huxley, MD;  Location: [place] [facility];  Service: Cardiovascular;  Laterality: Left;  . LOWER EXTREMITY ANGIOGRAPHY Right 04/09/2018   Procedure: Lower Extremity Angiography with possible  intervention;  Surgeon: Algernon Huxley, MD;  Location: [place] [facility];  Service: Cardiovascular;  Laterality: Right;  . PERIPHERAL VASCULAR CATHETERIZATION Left 09/01/2015   Procedure: A/V Shuntogram/Fistulagram;  Surgeon: Katha Cabal, MD;  Location: [place] [facility];  Service: Cardiovascular;  Laterality: Left;  . PERIPHERAL VASCULAR CATHETERIZATION N/A 09/30/2015   Procedure: A/V Shuntogram/Fistulagram with perm cathether removal;  Surgeon: Algernon Huxley, MD;  Location: [place] [facility];  Service: Cardiovascular;  Laterality: N/A;  . PERIPHERAL VASCULAR CATHETERIZATION Left 09/30/2015   Procedure: A/V Shunt Intervention;  Surgeon: Algernon Huxley, MD;  Location: [place] [facility];  Service: Cardiovascular;  Laterality: Left;  . PERIPHERAL VASCULAR CATHETERIZATION Left 12/03/2015   Procedure: Thrombectomy;  Surgeon: Algernon Huxley, MD;  Location: [place] [facility];  Service: Cardiovascular;  Laterality: Left;  . PERIPHERAL VASCULAR CATHETERIZATION Left 01/28/2016   Procedure: Thrombectomy;  Surgeon: Algernon Huxley, MD;  Location: [place] [facility];  Service: Cardiovascular;  Laterality: Left;  . PERIPHERAL VASCULAR CATHETERIZATION N/A 01/28/2016   Procedure: A/V Shuntogram/Fistulagram;  Surgeon: Algernon Huxley, MD;  Location: [place] [facility];  Service: Cardiovascular;  Laterality: N/A;  . SKIN SPLIT GRAFT Right 05/24/2018   Procedure: SKIN GRAFT SPLIT THICKNESS ( RIGHT CALF);  Surgeon: Algernon Huxley, MD;  Location: [facility];  Service: Vascular;  Laterality: Right;  . WOUND DEBRIDEMENT Right 04/11/2018   Procedure: DEBRIDEMENT WOUND calf muscle and skin;  Surgeon: Algernon Huxley, MD;  Location: [facility];  Service: Vascular;  Laterality: Right;    Prior to Admission medications   Medication Sig Start Date End Date Taking? Authorizing Provider  allopurinol (ZYLOPRIM) 100 MG tablet Take 100 mg by mouth daily.    [provider]  amLODipine (NORVASC) 10 MG tablet Take 10 mg by mouth  at bedtime.     [provider]  amoxicillin-clavulanate (AUGMENTIN) 875-125 MG tablet Take 1 tablet by mouth 2 (two) times daily for 10 days. 05/30/18 06/09/18  Darel Hong, MD  apixaban (ELIQUIS) 2.5 MG TABS tablet Take 2.5 mg by mouth 2 (two) times daily. 03/28/18 05/27/18  [provider]  aspirin EC 81 MG tablet Take 1 tablet (81 mg total) by mouth daily. 04/02/18   Algernon Huxley, MD  atorvastatin (LIPITOR) 10 MG tablet Take 1 tablet by mouth daily. 11/23/17   [provider]  calcium acetate (PHOSLO) 667 MG capsule Take 1,334 mg by mouth 3 (three) times daily with meals.    [provider]  clonazePAM (KLONOPIN) 0.5 MG tablet Take 1 tablet (0.5 mg total) by mouth as needed. 04/13/18   Henreitta Leber, MD  clopidogrel (PLAVIX) 75 MG tablet Take 1 tablet (75 mg total) by mouth daily. 04/02/18   Algernon Huxley, MD  colchicine 0.6 MG tablet Take 0.6 mg by mouth daily as needed (for gout flares).     [provider]  folic acid-vitamin b complex-vitamin c-selenium-zinc (DIALYVITE) 3 MG TABS tablet Take by mouth.    [provider]  furosemide (LASIX) 40 MG tablet Take 40 mg by mouth daily.     [provider]  gabapentin (NEURONTIN) 100 MG capsule Take 300 mg by mouth 3 (three) times daily.  03/28/18   [provider]  HYDROcodone-acetaminophen (NORCO/VICODIN) 5-325 MG tablet Take 1 tablet by mouth every 4 (four) hours as needed for moderate pain. 05/24/18   Algernon Huxley, MD  metoprolol succinate (TOPROL-XL) 25 MG 24 hr tablet Take 25 mg by mouth at bedtime. 03/02/18   [provider]    Allergies  Allergen Reactions  . Shellfish Allergy Anaphylaxis    Family History  Problem Relation Age of Onset  . Hypertension Other   . Heart disease Other   . Diabetes Mother     Social History Social History   Tobacco Use  . Smoking status: Former Smoker    Types: Cigarettes    Last attempt to quit: 05/17/2005    Years  since quitting: 13.0  . Smokeless tobacco: Never Used  Substance Use Topics  . Alcohol use: No  . Drug use: No    Review of Systems Constitutional: Negative for fever.  Positive for fatigue Cardiovascular: Negative for chest pain. Respiratory: Negative for shortness of breath. Gastrointestinal: Negative for abdominal pain, vomiting  Genitourinary: Does not make urine. Musculoskeletal: Negative for musculoskeletal complaints Skin: Negative for skin complaints  Neurological: Negative for headache All other ROS negative  ____________________________________________   PHYSICAL EXAM:  VITAL SIGNS: ED Triage Vitals  Enc Vitals Group     BP --      Pulse Rate 05/30/18 1111 84     Resp 05/30/18 1111 14     Temp 05/30/18 1111 97.8 F (36.6 C)     Temp Source 05/30/18 1111 Oral     SpO2 05/30/18 1111 100 %     Weight 05/30/18 1112 140 lb (63.5 kg)     Height 05/30/18 1112 5\' 8"  (1.727 m)     Head Circumference --      Peak Flow --      Pain Score 05/30/18 1111 8     Pain Loc --      Pain Edu? --      Excl. in [place]? --    Constitutional: Somnolent, but awakens to voice answers questions and follows commands, will fall asleep if not actively engaged. Eyes: Normal exam ENT   Head: Normocephalic and atraumatic   Mouth/Throat: Mucous membranes are moist. Cardiovascular: Normal rate, regular rhythm.  Respiratory: Normal respiratory effort without tachypnea nor retractions. Breath sounds are clear  Gastrointestinal: Soft and nontender. No distention. Musculoskeletal: Chronic right lower extremity wound currently with stapled bandage overlying the wound Neurologic:  Normal speech and language. No gross focal neurologic deficits Skin:  Skin is warm, dry and intact.  Psychiatric: Mood and affect are normal.  ____________________________________________  EKG viewed and interpreted by myself shows a normal sinus rhythm 80 bpm with a slightly widened QRS, normal axis, largely  normal intervals, left bundle branch block nonspecific ST changes.  INITIAL IMPRESSION / ASSESSMENT AND PLAN / ED COURSE  Pertinent labs & imaging results that were available during my care of the patient were reviewed by me and considered in my medical decision making (see chart for details).  Patient presents to the emergency department after an episode of shaking and now fatigue.  Patient states this is typical of his anxiety where he will start shaking uncontrollably followed by significant fatigue.  Patient states he was also in the emergency department last night, which is why he is very tired.  We will check labs and closely monitor.  We will let the patient rest in the emergency department and continue to reevaluate.  Labs are largely at the patient's baseline.  No acute abnormality.  Potassium is 4.2.  Patient appears well, and is able to converse well with me, does not appear somnolent any longer.  Patient continues to ask for something for anxiety.  I discussed with the patient that he appears very well and I do not believe he needs anxiety medication at this time but I do believe he should follow-up with his doctor to discuss this further.  Patient then states he is not sure how we can even get home, when asked how he normally gets around he states there is a lady that takes him places but he does not know her name or her number.  We will discussed with the patient was living facility to attempt to facilitate a ride home for the patient.  He appears overall very well.  ____________________________________________   FINAL CLINICAL IMPRESSION(S) / ED DIAGNOSES  Anxiety Weakness   Harvest Dark, MD 05/30/18 1451    Harvest Dark, MD 05/30/18 [phone]

## 2018-05-30 NOTE — ED Provider Notes (Signed)
[facility] [place] Emergency Department Provider Note  ____________________________________________   First MD Initiated Contact with Patient 05/30/18 0113     (approximate)  I have reviewed the triage vital signs and the nursing notes.   HISTORY  Chief Complaint Seizures   HPI Johnny [name]. is a 68 y.o. male who comes to the emergency department via EMS with "shaking" that began earlier today.  The patient says he has a previous diagnosis of "seizures" since November although is not currently taking any seizure medications.  He has a longstanding history of severe anxiety and he says he feels like he is having a panic attack.  He has a past medical history of CHF as well as end-stage renal disease and was most recently dialyzed yesterday.  He denies headache.  His symptoms came on suddenly were severe and nothing seems to make them better or worse.    Past Medical History:  Diagnosis Date  . Anemia   . Anxiety   . CHF (congestive heart failure) ([place])   . Chronic kidney disease    esrd dialysis m/w/f  . Gout   . Hyperlipidemia   . Hypertension   . Myocardial infarction ([place])    10 years ago  . Peripheral vascular disease [facility])     Patient Active Problem List   Diagnosis Date Noted  . Malnutrition of moderate degree 04/11/2018  . Pressure injury of skin 04/06/2018  . Altered mental status 04/04/2018  . Hypothermia 04/04/2018  . Hyperlipidemia 02/27/2018  . Diabetes ([place]) 02/27/2018  . Weakness of right lower extremity 01/20/2018  . Fever   . Periumbilical abdominal pain   . Confusion 12/22/2017  . Acute delirium 12/21/2017  . Protein-calorie malnutrition, severe 12/19/2017  . Intractable nausea and vomiting 12/18/2017  . Lymphedema 12/13/2017  . Cellulitis 11/27/2017  . Chest pain 11/19/2017  . Atherosclerosis of native arteries of the extremities with ulceration ([place]) 11/07/2017  . Elevated troponin 10/02/2015  . Complications, dialysis,  catheter, mechanical ([place]) 10/02/2015  . Musculoskeletal chest pain 09/28/2015  . ESRD on dialysis ([place]) 09/28/2015  . HTN (hypertension) 09/28/2015  . Chronic diastolic CHF (congestive heart failure) ([place]) 09/28/2015  . Gout 09/28/2015    Past Surgical History:  Procedure Laterality Date  . A/V SHUNTOGRAM Left 06/21/2017   Procedure: A/V SHUNTOGRAM;  Surgeon: Katha Cabal, MD;  Location: [place] CV [facility];  Service: Cardiovascular;  Laterality: Left;  . APPLICATION OF WOUND VAC Right 04/11/2018   Procedure: APPLICATION OF WOUND VAC;  Surgeon: Algernon Huxley, MD;  Location: [facility] ORS;  Service: Vascular;  Laterality: Right;  . AV FISTULA PLACEMENT Left 09/18/2015   Procedure: INSERTION OF ARTERIOVENOUS (AV) GORE-TEX GRAFT ARM ( BRACH/AXILLARY GRAFT W/ INSTANT STICK GRAFT );  Surgeon: Katha Cabal, MD;  Location: [facility] OR[facility];  Service: Vascular;  Laterality: Left;  . DIALYSIS FISTULA CREATION    . ESOPHAGOGASTRODUODENOSCOPY N/A 12/19/2017   Procedure: ESOPHAGOGASTRODUODENOSCOPY (EGD);  Surgeon: Lin Landsman, MD;  Location: [facility];  Service: Gastroenterology;  Laterality: N/A;  . LOWER EXTREMITY ANGIOGRAPHY Left 11/16/2017   Procedure: LOWER EXTREMITY ANGIOGRAPHY;  Surgeon: Algernon Huxley, MD;  Location: [place] [facility];  Service: Cardiovascular;  Laterality: Left;  . LOWER EXTREMITY ANGIOGRAPHY Right 01/18/2018   Procedure: LOWER EXTREMITY ANGIOGRAPHY;  Surgeon: Algernon Huxley, MD;  Location: [place] [facility];  Service: Cardiovascular;  Laterality: Right;  . LOWER EXTREMITY ANGIOGRAPHY Left 04/02/2018   Procedure: LOWER EXTREMITY ANGIOGRAPHY;  Surgeon: Leotis Pain  S, MD;  Location: [place] [facility];  Service: Cardiovascular;  Laterality: Left;  . LOWER EXTREMITY ANGIOGRAPHY Right 04/09/2018   Procedure: Lower Extremity Angiography with possible intervention;  Surgeon: Algernon Huxley, MD;  Location: [place] [facility];  Service: Cardiovascular;  Laterality: Right;  .  PERIPHERAL VASCULAR CATHETERIZATION Left 09/01/2015   Procedure: A/V Shuntogram/Fistulagram;  Surgeon: Katha Cabal, MD;  Location: [place] [facility];  Service: Cardiovascular;  Laterality: Left;  . PERIPHERAL VASCULAR CATHETERIZATION N/A 09/30/2015   Procedure: A/V Shuntogram/Fistulagram with perm cathether removal;  Surgeon: Algernon Huxley, MD;  Location: [place] [facility];  Service: Cardiovascular;  Laterality: N/A;  . PERIPHERAL VASCULAR CATHETERIZATION Left 09/30/2015   Procedure: A/V Shunt Intervention;  Surgeon: Algernon Huxley, MD;  Location: [place] [facility];  Service: Cardiovascular;  Laterality: Left;  . PERIPHERAL VASCULAR CATHETERIZATION Left 12/03/2015   Procedure: Thrombectomy;  Surgeon: Algernon Huxley, MD;  Location: [place] [facility];  Service: Cardiovascular;  Laterality: Left;  . PERIPHERAL VASCULAR CATHETERIZATION Left 01/28/2016   Procedure: Thrombectomy;  Surgeon: Algernon Huxley, MD;  Location: [place] [facility];  Service: Cardiovascular;  Laterality: Left;  . PERIPHERAL VASCULAR CATHETERIZATION N/A 01/28/2016   Procedure: A/V Shuntogram/Fistulagram;  Surgeon: Algernon Huxley, MD;  Location: [place] [facility];  Service: Cardiovascular;  Laterality: N/A;  . SKIN SPLIT GRAFT Right 05/24/2018   Procedure: SKIN GRAFT SPLIT THICKNESS ( RIGHT CALF);  Surgeon: Algernon Huxley, MD;  Location: [facility];  Service: Vascular;  Laterality: Right;  . WOUND DEBRIDEMENT Right 04/11/2018   Procedure: DEBRIDEMENT WOUND calf muscle and skin;  Surgeon: Algernon Huxley, MD;  Location: [facility] OR[facility];  Service: Vascular;  Laterality: Right;    Prior to Admission medications   Medication Sig Start Date End Date Taking? Authorizing Provider  allopurinol (ZYLOPRIM) 100 MG tablet Take 100 mg by mouth daily.    [provider]  amLODipine (NORVASC) 10 MG tablet Take 10 mg by mouth daily.    [provider]  amoxicillin-clavulanate (AUGMENTIN) 875-125 MG tablet Take 1 tablet by mouth 2 (two)  times daily for 10 days. 05/30/18 06/09/18  Darel Hong, MD  apixaban (ELIQUIS) 2.5 MG TABS tablet Take 2.5 mg by mouth 2 (two) times daily. 03/28/18 05/27/18  [provider]  aspirin EC 81 MG tablet Take 1 tablet (81 mg total) by mouth daily. 04/02/18   Algernon Huxley, MD  atorvastatin (LIPITOR) 10 MG tablet Take 1 tablet by mouth daily. 11/23/17   [provider]  calcitRIOL (ROCALTROL) 0.5 MCG capsule Take 0.5 mcg by mouth daily.    [provider]  calcium acetate (PHOSLO) 667 MG capsule Take 1,334 mg by mouth 3 (three) times daily with meals.    [provider]  carvedilol (COREG) 6.25 MG tablet Take 6.25 mg by mouth every evening.    [provider]  clonazePAM (KLONOPIN) 0.5 MG tablet Take 1 tablet (0.5 mg total) by mouth as needed. 04/13/18   Henreitta Leber, MD  clopidogrel (PLAVIX) 75 MG tablet Take 1 tablet (75 mg total) by mouth daily. 04/02/18   Algernon Huxley, MD  colchicine 0.6 MG tablet Take 0.6 mg by mouth daily as needed (for gout flares).     [provider]  folic acid-vitamin b complex-vitamin c-selenium-zinc (DIALYVITE) 3 MG TABS tablet Take by mouth.    [provider]  furosemide (LASIX) 40 MG tablet Take 40 mg by mouth daily.  [provider]  gabapentin (NEURONTIN) 100 MG capsule Take 300 mg by mouth 3 (three) times daily. 03/28/18   [provider]  hydrALAZINE (APRESOLINE) 25 MG tablet Take 25 mg by mouth daily.    [provider]  HYDROcodone-acetaminophen (NORCO/VICODIN) 5-325 MG tablet Take 1 tablet by mouth every 4 (four) hours as needed for moderate pain. 05/24/18   Algernon Huxley, MD  irbesartan (AVAPRO) 150 MG tablet Take 150 mg by mouth 2 (two) times daily.    [provider]  metoprolol succinate (TOPROL-XL) 25 MG 24 hr tablet Take 25 mg by mouth at bedtime. 03/02/18   [provider]  pregabalin (LYRICA) 50 MG capsule Take 50 mg by mouth daily.    [provider]  sevelamer carbonate (RENVELA) 800 MG tablet Take 2,400 mg by mouth 3 (three) times daily with meals.    [provider]    Allergies Shellfish allergy  Family History  Problem Relation Age of Onset  . Hypertension Other   . Heart disease Other   . Diabetes Mother     Social History Social History   Tobacco Use  . Smoking status: Former Smoker    Types: Cigarettes    Last attempt to quit: 05/17/2005    Years since quitting: 13.0  . Smokeless tobacco: Never Used  Substance Use Topics  . Alcohol use: No  . Drug use: No    Review of Systems Constitutional: No fever/chills Eyes: No visual changes. ENT: No sore throat. Cardiovascular: Denies chest pain. Respiratory: Denies shortness of breath. Gastrointestinal: No abdominal pain.  No nausea, no vomiting.  No diarrhea.  No constipation. Genitourinary: Negative for dysuria. Musculoskeletal: Negative for back pain. Skin: Negative for rash. Neurological: Positive for tremors   ____________________________________________   PHYSICAL EXAM:  VITAL SIGNS: ED Triage Vitals  Enc Vitals Group     BP --      Pulse Rate 05/30/18 0100 91     Resp 05/30/18 0100 (!) 23     Temp 05/30/18 0100 99.8 F (37.7 C)     Temp Source 05/30/18 0100 Oral     SpO2 05/30/18 0100 97 %     Weight 05/30/18 0101 136 lb 0.4 oz (61.7 kg)     Height 05/30/18 0101 5\' 8"  (1.727 m)     Head Circumference --      Peak Flow --      Pain Score 05/30/18 0100 7     Pain Loc --      Pain Edu? --      Excl. in [place]? --     Constitutional: The patient is not seizing when I see him although he is having diffuse body tremors.  He is able to talk and can clearly elucidate what is going on Eyes: PERRL EOMI. midrange and brisk Head: Atraumatic. Nose: No congestion/rhinnorhea. Mouth/Throat: No trismus Neck: No stridor.  No meningismus Cardiovascular: Normal rate, regular rhythm. Grossly normal heart sounds.  Good peripheral  circulation. Respiratory: Normal respiratory effort.  No retractions. Lungs CTAB and moving good air Gastrointestinal: Soft nontender Musculoskeletal: Right lower extremity reviewed by me with a wound that appears to be infected Neurologic: Moves all 4 and feels all 4. Skin: No signs of necrotizing soft tissue infection but right lower extremity wound does appear infected Psychiatric: Appears quite anxious    ____________________________________________   DIFFERENTIAL includes but not limited to  Panic attack, metabolic derangement, sepsis, urinary tract infection, meningitis ____________________________________________   LABS (all  labs ordered are listed, but only abnormal results are displayed)  Labs Reviewed  CBC WITH DIFFERENTIAL/PLATELET - Abnormal; Notable for the following components:      Result Value   RBC 3.40 (*)    Hemoglobin 10.5 (*)    HCT 33.8 (*)    Monocytes Absolute 1.3 (*)    All other components within normal limits  BASIC METABOLIC PANEL - Abnormal; Notable for the following components:   Potassium 5.4 (*)    CO2 21 (*)    BUN 59 (*)    Creatinine, Ser 10.78 (*)    GFR calc non Af Amer 4 (*)    GFR calc Af Amer 5 (*)    All other components within normal limits  HEPATIC FUNCTION PANEL - Abnormal; Notable for the following components:   Albumin 3.1 (*)    All other components within normal limits  CULTURE, BLOOD (ROUTINE X 2)  PROTIME-INR  LACTIC ACID, PLASMA  AMMONIA  INFLUENZA PANEL BY PCR (TYPE A & B)  PROCALCITONIN    Lab work reviewed by me largely unremarkable.  He does have CKD although no indication for emergent dialysis __________________________________________  EKG  ED ECG REPORT I, Darel Hong, the attending physician, personally viewed and interpreted this ECG.  Date: 06/03/2018 EKG Time:  Rate: 80 Rhythm: normal sinus rhythm QRS Axis: normal Intervals: Leftward axis ST/T Wave abnormalities: normal Narrative  Interpretation: no evidence of acute ischemia.  Left bundle branch block  ____________________________________________  RADIOLOGY  Head CT reviewed by me with no acute disease Chest x-ray reviewed by me with no acute disease Right tibia x-ray reviewed by me with no acute disease ____________________________________________   PROCEDURES  Procedure(s) performed: no  Procedures  Critical Care performed: no  ____________________________________________   INITIAL IMPRESSION / ASSESSMENT AND PLAN / ED COURSE  Pertinent labs & imaging results that were available during my care of the patient were reviewed by me and considered in my medical decision making (see chart for details).   As part of my medical decision making, I reviewed the following data within the [place] History obtained from family if available, nursing notes, old chart and ekg, as well as notes from prior ED visits.  Patient comes to the emergency department quite anxious appearing.  He gives no reported history of seizures however on chart review from his neurology notes it care everywhere he does not have a diagnosis of seizure disorder and carries a diagnosis of tremors.  I gave him 4 mg of Ativan and after which he said he felt significant relief and he is no longer shaking.  He does have a core temperature of 100.7 degrees in the right lower extremity that appears infected.  I did consider briefly meningitis however the patient is behaving normally with a normal neuro exam and he is currently anticoagulated so we could not perform a lumbar puncture anyway.  I think he most recently has a wound infection on his leg and his chronic symptoms are contributing to his tremors.  I will cover him with Augmentin and refer him back to primary care.  Strict return precautions have been given to the patient verbalized understanding and agreement with the plan.       ____________________________________________   FINAL CLINICAL IMPRESSION(S) / ED DIAGNOSES  Final diagnoses:  Tremors of nervous system  Anxiety  Wound infection      NEW MEDICATIONS STARTED DURING THIS VISIT:  Discharge Medication List as of 05/30/2018  5:08  AM    START taking these medications   Details  amoxicillin-clavulanate (AUGMENTIN) 875-125 MG tablet Take 1 tablet by mouth 2 (two) times daily for 10 days., Starting Wed 05/30/2018, Until Sat 06/09/2018, Print         Note:  This document was prepared using Dragon voice recognition software and may include unintentional dictation errors.    Darel Hong, MD 06/03/18 [phone]

## 2018-05-30 NOTE — ED Triage Notes (Addendum)
Pt BIB EMS from home for reported seizure. Pt dx with seizures in November, not currently on any seizure medications . Pt reports feeling anxious, aura reported but no LOC, no incontinence. Only upper body involvement. Pt is alert and oriented in triage.

## 2018-05-30 NOTE — Discharge Instructions (Signed)
Please take your antibiotics as prescribed and follow-up with your primary care physician this coming Monday for recheck.  Return to the emergency department sooner for any concerns whatsoever.  It was a pleasure to take care of you today, and thank you for coming to our emergency department.  If you have any questions or concerns before leaving please ask the nurse to grab me and I'm more than happy to go through your aftercare instructions again.  If you were prescribed any opioid pain medication today such as Norco, Vicodin, Percocet, morphine, hydrocodone, or oxycodone please make sure you do not drive when you are taking this medication as it can alter your ability to drive safely.  If you have any concerns once you are home that you are not improving or are in fact getting worse before you can make it to your follow-up appointment, please do not hesitate to call 911 and come back for further evaluation.  Darel Hong, MD  Results for orders placed or performed during the hospital encounter of 05/30/18  CBC with Differential  Result Value Ref Range   WBC 8.9 4.0 - 10.5 K/uL   RBC 3.40 (L) 4.22 - 5.81 MIL/uL   Hemoglobin 10.5 (L) 13.0 - 17.0 g/dL   HCT 33.8 (L) 39.0 - 52.0 %   MCV 99.4 80.0 - 100.0 fL   MCH 30.9 26.0 - 34.0 pg   MCHC 31.1 30.0 - 36.0 g/dL   RDW 14.0 11.5 - 15.5 %   Platelets 339 150 - 400 K/uL   nRBC 0.0 0.0 - 0.2 %   Neutrophils Relative % 70 %   Neutro Abs 6.3 1.7 - 7.7 K/uL   Lymphocytes Relative 11 %   Lymphs Abs 1.0 0.7 - 4.0 K/uL   Monocytes Relative 15 %   Monocytes Absolute 1.3 (H) 0.1 - 1.0 K/uL   Eosinophils Relative 3 %   Eosinophils Absolute 0.3 0.0 - 0.5 K/uL   Basophils Relative 1 %   Basophils Absolute 0.1 0.0 - 0.1 K/uL   Immature Granulocytes 0 %   Abs Immature Granulocytes 0.02 0.00 - 0.07 K/uL  Protime-INR  Result Value Ref Range   Prothrombin Time 15.1 11.4 - 15.2 seconds   INR 1.20   Lactic acid, plasma  Result Value Ref Range   Lactic Acid, Venous 1.1 0.5 - 1.9 mmol/L  Ammonia  Result Value Ref Range   Ammonia 35 9 - 35 umol/L  Influenza panel by PCR (type A & B)  Result Value Ref Range   Influenza A By PCR NEGATIVE NEGATIVE   Influenza B By PCR NEGATIVE NEGATIVE  Basic metabolic panel  Result Value Ref Range   Sodium 141 135 - 145 mmol/L   Potassium 5.4 (H) 3.5 - 5.1 mmol/L   Chloride 105 98 - 111 mmol/L   CO2 21 (L) 22 - 32 mmol/L   Glucose, Bld 97 70 - 99 mg/dL   BUN 59 (H) 8 - 23 mg/dL   Creatinine, Ser 10.78 (H) 0.61 - 1.24 mg/dL   Calcium 9.4 8.9 - 10.3 mg/dL   GFR calc non Af Amer 4 (L) >60 mL/min   GFR calc Af Amer 5 (L) >60 mL/min   Anion gap 15 5 - 15  Procalcitonin  Result Value Ref Range   Procalcitonin 2.07 ng/mL  Hepatic function panel  Result Value Ref Range   Total Protein 6.6 6.5 - 8.1 g/dL   Albumin 3.1 (L) 3.5 - 5.0 g/dL   AST 21  15 - 41 U/L   ALT 8 0 - 44 U/L   Alkaline Phosphatase 79 38 - 126 U/L   Total Bilirubin 0.5 0.3 - 1.2 mg/dL   Bilirubin, Direct 0.1 0.0 - 0.2 mg/dL   Indirect Bilirubin 0.4 0.3 - 0.9 mg/dL   Dg Tibia/fibula Right  Result Date: 05/30/2018 CLINICAL DATA:  Right lower extremity wound. Evaluate for osteomyelitis EXAM: RIGHT TIBIA AND FIBULA - 2 VIEW COMPARISON:  07/02/2009 FINDINGS: Multiple skin staples are present. No unexpected opaque foreign body, soft tissue emphysema, or bony erosion. There is osteopenia and diffuse atherosclerotic calcification. Remote medial collateral ligament injury. Lower SFA to upper popliteal stenting. IMPRESSION: No evidence of osteomyelitis.  No soft tissue emphysema. Electronically Signed   By: Monte Fantasia M.D.   On: 05/30/2018 04:48   Ct Head Wo Contrast  Result Date: 05/30/2018 CLINICAL DATA:  Seizures EXAM: CT HEAD WITHOUT CONTRAST TECHNIQUE: Contiguous axial images were obtained from the base of the skull through the vertex without intravenous contrast. COMPARISON:  CT brain 04/04/2018 FINDINGS: Brain: No acute  territorial infarction, hemorrhage or intracranial mass. Mild atrophy. Moderate small vessel ischemic changes of the white matter. Stable ventricle size. Vascular: No hyperdense vessels. Vertebral and carotid vascular calcification Skull: Normal. Negative for fracture or focal lesion. Sinuses/Orbits: No acute finding. Other: None IMPRESSION: 1. No CT evidence for acute intracranial abnormality. 2. Atrophy and chronic small vessel ischemic changes of white matter Electronically Signed   By: Donavan Foil M.D.   On: 05/30/2018 02:43   Dg Chest Port 1 View  Result Date: 05/30/2018 CLINICAL DATA:  Fever and cough EXAM: PORTABLE CHEST 1 VIEW COMPARISON:  04/04/2018 FINDINGS: Right-sided central venous catheter tips over the SVC. Streaky atelectasis at the bases. No pleural effusion. Stable cardiomediastinal silhouette. No pneumothorax. IMPRESSION: No active disease. Electronically Signed   By: Donavan Foil M.D.   On: 05/30/2018 03:49   Vas [name] Upper Extremity Arterial Duplex  Result Date: 05/25/2018 UPPER EXTREMITY DUPLEX STUDY Indications: ESRD, ON DIALYSIS, FAILED LEFT AVF. History:     New Vein mapping.  Comparison Study: new vein map Performing Technologist: Concha [place] RVT  Examination Guidelines: A complete evaluation includes B-mode imaging, spectral Doppler, color Doppler, and power Doppler as needed of all accessible portions of each vessel. Bilateral testing is considered an integral part of a complete examination. Limited examinations for reoccurring indications may be performed as noted.  Right Pre-Dialysis Findings: +-----------------------+----------+--------------------+----------+--------+  Location                PSV (cm/s) Intralum. Diam. (cm) Waveform   Comments  +-----------------------+----------+--------------------+----------+--------+  Brachial Antecub. fossa 81         0.83                 monophasic           +-----------------------+----------+--------------------+----------+--------+   Radial Art at Wrist     110        0.22                 monophasic           +-----------------------+----------+--------------------+----------+--------+  Ulnar Art at Wrist      95         0.18                 monophasic           +-----------------------+----------+--------------------+----------+--------+  Summary:  Right: Moderate atherosclerosis seen in distal brachial, radial, and  ulnar arteries. *See table(s) above for measurements and observations. Electronically signed by Leotis Pain MD on 05/25/2018 at 10:12:40 AM.    Final    Vas [name] Upper Extremity Vein Mapping  Result Date: 05/25/2018 UPPER EXTREMITY VEIN MAPPING  Indications: Pre-access. History: New Vein mapping right          Failed left AVF.  Comparison Study: none Performing Technologist: Concha Norway RVT  Examination Guidelines: A complete evaluation includes B-mode imaging, spectral Doppler, color Doppler, and power Doppler as needed of all accessible portions of each vessel. Bilateral testing is considered an integral part of a complete examination. Limited examinations for reoccurring indications may be performed as noted. +-----------------+-------------+----------+--------+  Right Cephalic    Diameter (cm) Depth (cm) Findings  +-----------------+-------------+----------+--------+  Shoulder              0.28                           +-----------------+-------------+----------+--------+  Prox upper arm        0.29                           +-----------------+-------------+----------+--------+  Mid upper arm         0.26                           +-----------------+-------------+----------+--------+  Dist upper arm        0.19                           +-----------------+-------------+----------+--------+  Antecubital fossa     0.19                           +-----------------+-------------+----------+--------+  Prox forearm          0.15                           +-----------------+-------------+----------+--------+  Mid forearm            0.29                           +-----------------+-------------+----------+--------+  Dist forearm          0.23                           +-----------------+-------------+----------+--------+ +-----------------+-------------+----------+---------+  Right Basilic     Diameter (cm) Depth (cm) Findings   +-----------------+-------------+----------+---------+  Prox upper arm        0.56                            +-----------------+-------------+----------+---------+  Mid upper arm         0.39                            +-----------------+-------------+----------+---------+  Dist upper arm        0.38                 branching  +-----------------+-------------+----------+---------+  Antecubital fossa     0.35                            +-----------------+-------------+----------+---------+  Prox forearm          0.27                            +-----------------+-------------+----------+---------+ *See table(s) above for measurements and observations.  Diagnosing physician: Leotis Pain MD Electronically signed by Leotis Pain MD on 05/25/2018 at 10:12:23 AM.    Final

## 2018-05-30 NOTE — Telephone Encounter (Signed)
Someone name Johnny [name] (not seeing her on DPR) called and stated she wanted to let Dr. Lucky Cowboy know that pt went to ER due to tremors and his leg being infected.   I attempted to reach pt no answer left vm to call back

## 2018-05-30 NOTE — ED Notes (Signed)
[employer] taxi company called for discharge transportation

## 2018-05-30 NOTE — ED Notes (Signed)
XR at bedside

## 2018-05-30 NOTE — Telephone Encounter (Signed)
I will make dew aware

## 2018-05-30 NOTE — ED Triage Notes (Signed)
Pt arrived via EMS from dialysis with c/o tremors and generalized pain. Pt was seen at this ED this morning for same and discharged. Pt was recently treated with antibiotics for wound on right leg. Pt is A&O x 4 at this time.

## 2018-05-30 NOTE — ED Notes (Signed)
PT is resting more comfortably following IV ativan, tremor has stopped at this time.

## 2018-05-30 NOTE — ED Notes (Addendum)
Unable to get second set of BC due to poor veins and inability to stick left arm r/t dialysis access. PT also no longer produces urine, unable to collect UA at this time. MD aware.

## 2018-05-30 NOTE — ED Notes (Signed)
Pt verbalized understanding of d/c instructions, rx, and f/u care. No further questions at this time. Pt transported home by [facility].

## 2018-05-30 NOTE — ED Notes (Signed)
Patient transported to CT 

## 2018-05-30 NOTE — ED Notes (Addendum)
Attempted to call [name] per pt request for ride home. She is unable to come get pt. RN will arrange transport.

## 2018-05-31 ENCOUNTER — Telehealth (INDEPENDENT_AMBULATORY_CARE_PROVIDER_SITE_OTHER): Payer: Self-pay | Admitting: Vascular Surgery

## 2018-05-31 NOTE — Telephone Encounter (Signed)
Please advise. See note below.

## 2018-05-31 NOTE — Telephone Encounter (Signed)
Some bleeding is normal, as long as the entire dressing is not soaked with fresh blood.  If there is old dried blood, that is ok and to be expected.

## 2018-05-31 NOTE — Telephone Encounter (Signed)
Spoke with the patient's caregiver and gave her Ronette Deter recommendations. See note below.

## 2018-06-04 LAB — CULTURE, BLOOD (ROUTINE X 2)
Culture: NO GROWTH
Special Requests: ADEQUATE

## 2018-06-06 ENCOUNTER — Telehealth (INDEPENDENT_AMBULATORY_CARE_PROVIDER_SITE_OTHER): Payer: Self-pay

## 2018-06-06 NOTE — Telephone Encounter (Signed)
Johnny [name] (not on dpr) left vm on nurse line stating that pt's leg has a strange odor and that she has noticed an ulcer that has formed. She was unsure of what to do.   Number provided was [phone]

## 2018-06-06 NOTE — Telephone Encounter (Signed)
Please have the patient brought in Friday for Dr. Lucky Cowboy, so we can evaluate the wound

## 2018-06-06 NOTE — Telephone Encounter (Signed)
See below

## 2018-06-08 ENCOUNTER — Ambulatory Visit (INDEPENDENT_AMBULATORY_CARE_PROVIDER_SITE_OTHER): Payer: Medicare Other | Admitting: Vascular Surgery

## 2018-06-08 ENCOUNTER — Encounter (INDEPENDENT_AMBULATORY_CARE_PROVIDER_SITE_OTHER): Payer: Self-pay | Admitting: Vascular Surgery

## 2018-06-08 VITALS — BP 136/88 | HR 98

## 2018-06-08 DIAGNOSIS — I7025 Atherosclerosis of native arteries of other extremities with ulceration: Secondary | ICD-10-CM

## 2018-06-08 DIAGNOSIS — I10 Essential (primary) hypertension: Secondary | ICD-10-CM

## 2018-06-08 DIAGNOSIS — Z992 Dependence on renal dialysis: Secondary | ICD-10-CM

## 2018-06-08 DIAGNOSIS — N186 End stage renal disease: Secondary | ICD-10-CM

## 2018-06-08 DIAGNOSIS — L97211 Non-pressure chronic ulcer of right calf limited to breakdown of skin: Secondary | ICD-10-CM

## 2018-06-08 DIAGNOSIS — I120 Hypertensive chronic kidney disease with stage 5 chronic kidney disease or end stage renal disease: Secondary | ICD-10-CM

## 2018-06-08 NOTE — Progress Notes (Signed)
Patient ID: Johnny Bracey., male   DOB: Jul 12, 1950, 68 y.o.   MRN: [id number]  Chief Complaint  Patient presents with  . Leg Pain    HPI Johnny Cothern. is a 68 y.o. male.  Patient returns in follow-up to evaluate his leg wound.  He is about 2 weeks status post synthetic skin graft to the right lower extremity.  This still hurts.  He has a Scientist, product/process development of medical issues.   Past Medical History:  Diagnosis Date  . Anemia   . Anxiety   . CHF (congestive heart failure) ([place])   . Chronic kidney disease    esrd dialysis m/w/f  . Gout   . Hyperlipidemia   . Hypertension   . Myocardial infarction ([place])    10 years ago  . Peripheral vascular disease [facility])     Past Surgical History:  Procedure Laterality Date  . A/V SHUNTOGRAM Left 06/21/2017   Procedure: A/V SHUNTOGRAM;  Surgeon: Katha Cabal, MD;  Location: [place] CV LAB;  Service: Cardiovascular;  Laterality: Left;  . APPLICATION OF WOUND VAC Right 04/11/2018   Procedure: APPLICATION OF WOUND VAC;  Surgeon: Algernon Huxley, MD;  Location: [facility] ORS;  Service: Vascular;  Laterality: Right;  . AV FISTULA PLACEMENT Left 09/18/2015   Procedure: INSERTION OF ARTERIOVENOUS (AV) GORE-TEX GRAFT ARM ( BRACH/AXILLARY GRAFT W/ INSTANT STICK GRAFT );  Surgeon: Katha Cabal, MD;  Location: [facility] ORS;  Service: Vascular;  Laterality: Left;  . DIALYSIS FISTULA CREATION    . ESOPHAGOGASTRODUODENOSCOPY N/A 12/19/2017   Procedure: ESOPHAGOGASTRODUODENOSCOPY (EGD);  Surgeon: Lin Landsman, MD;  [facility] ENDOSCOPY;  Service: Gastroenterology;  Laterality: N/A;  . LOWER EXTREMITY ANGIOGRAPHY Left 11/16/2017   Procedure: LOWER EXTREMITY ANGIOGRAPHY;  Surgeon: Algernon Huxley, MD;  Location: [place] CV LAB;  Service: Cardiovascular;  Laterality: Left;  . LOWER EXTREMITY ANGIOGRAPHY Right 01/18/2018   Procedure: LOWER EXTREMITY ANGIOGRAPHY;  Surgeon: Algernon Huxley, MD;  Location: [place] CV LAB;  Service: Cardiovascular;   Laterality: Right;  . LOWER EXTREMITY ANGIOGRAPHY Left 04/02/2018   Procedure: LOWER EXTREMITY ANGIOGRAPHY;  Surgeon: Algernon Huxley, MD;  Location: [place] CV LAB;  Service: Cardiovascular;  Laterality: Left;  . LOWER EXTREMITY ANGIOGRAPHY Right 04/09/2018   Procedure: Lower Extremity Angiography with possible intervention;  Surgeon: Algernon Huxley, MD;  Location: [place] CV LAB;  Service: Cardiovascular;  Laterality: Right;  . PERIPHERAL VASCULAR CATHETERIZATION Left 09/01/2015   Procedure: A/V Shuntogram/Fistulagram;  Surgeon: Katha Cabal, MD;  Location: [place] CV LAB;  Service: Cardiovascular;  Laterality: Left;  . PERIPHERAL VASCULAR CATHETERIZATION N/A 09/30/2015   Procedure: A/V Shuntogram/Fistulagram with perm cathether removal;  Surgeon: Algernon Huxley, MD;  Location: [place] CV LAB;  Service: Cardiovascular;  Laterality: N/A;  . PERIPHERAL VASCULAR CATHETERIZATION Left 09/30/2015   Procedure: A/V Shunt Intervention;  Surgeon: Algernon Huxley, MD;  Location: [place] CV LAB;  Service: Cardiovascular;  Laterality: Left;  . PERIPHERAL VASCULAR CATHETERIZATION Left 12/03/2015   Procedure: Thrombectomy;  Surgeon: Algernon Huxley, MD;  Location: [place] CV LAB;  Service: Cardiovascular;  Laterality: Left;  . PERIPHERAL VASCULAR CATHETERIZATION Left 01/28/2016   Procedure: Thrombectomy;  Surgeon: Algernon Huxley, MD;  Location: [place] CV LAB;  Service: Cardiovascular;  Laterality: Left;  . PERIPHERAL VASCULAR CATHETERIZATION N/A 01/28/2016   Procedure: A/V Shuntogram/Fistulagram;  Surgeon: Algernon Huxley, MD;  Location: [place] CV LAB;  Service: Cardiovascular;  Laterality: N/A;  .  SKIN SPLIT GRAFT Right 05/24/2018   Procedure: SKIN GRAFT SPLIT THICKNESS ( RIGHT CALF);  Surgeon: Algernon Huxley, MD;  Location: [facility] ORS;  Service: Vascular;  Laterality: Right;  . WOUND DEBRIDEMENT Right 04/11/2018   Procedure: DEBRIDEMENT WOUND calf muscle and skin;  Surgeon: Algernon Huxley,  MD;  Location: [facility] ORS;  Service: Vascular;  Laterality: Right;      Allergies  Allergen Reactions  . Shellfish Allergy Anaphylaxis    Current Outpatient Medications  Medication Sig Dispense Refill  . allopurinol (ZYLOPRIM) 100 MG tablet Take 100 mg by mouth daily.    [name] amLODipine (NORVASC) 10 MG tablet Take 10 mg by mouth daily.    [name] apixaban (ELIQUIS) 2.5 MG TABS tablet Take 2.5 mg by mouth 2 (two) times daily.    [name] atorvastatin (LIPITOR) 10 MG tablet Take 1 tablet by mouth daily.    . calcitRIOL (ROCALTROL) 0.5 MCG capsule Take 0.5 mcg by mouth daily.    . calcium acetate (PHOSLO) 667 MG capsule Take 1,334 mg by mouth 3 (three) times daily with meals.    . carvedilol (COREG) 6.25 MG tablet Take 6.25 mg by mouth every evening.    . clonazePAM (KLONOPIN) 0.5 MG tablet Take 1 tablet (0.5 mg total) by mouth as needed. 10 tablet 0  . colchicine 0.6 MG tablet Take 0.6 mg by mouth daily as needed (for gout flares).     . folic acid-vitamin b complex-vitamin c-selenium-zinc (DIALYVITE) 3 MG TABS tablet Take by mouth.    . furosemide (LASIX) 40 MG tablet Take 40 mg by mouth daily.     [name] gabapentin (NEURONTIN) 100 MG capsule Take 300 mg by mouth 3 (three) times daily.    . hydrALAZINE (APRESOLINE) 25 MG tablet Take 25 mg by mouth daily.    . irbesartan (AVAPRO) 150 MG tablet Take 150 mg by mouth 2 (two) times daily.    . metoprolol succinate (TOPROL-XL) 25 MG 24 hr tablet Take 25 mg by mouth at bedtime.    . pregabalin (LYRICA) 50 MG capsule Take 50 mg by mouth daily.    . sevelamer carbonate (RENVELA) 800 MG tablet Take 2,400 mg by mouth 3 (three) times daily with meals.    [name] amoxicillin-clavulanate (AUGMENTIN) 875-125 MG tablet Take 1 tablet by mouth 2 (two) times daily for 10 days. (Patient not taking: Reported on 06/08/2018) 20 tablet 0  . aspirin EC 81 MG tablet Take 1 tablet (81 mg total) by mouth daily. (Patient not taking: Reported on 06/08/2018) 150 tablet 2  . clopidogrel (PLAVIX) 75  MG tablet Take 1 tablet (75 mg total) by mouth daily. (Patient not taking: Reported on 06/08/2018) 30 tablet 11  . HYDROcodone-acetaminophen (NORCO/VICODIN) 5-325 MG tablet Take 1 tablet by mouth every 4 (four) hours as needed for moderate pain. (Patient not taking: Reported on 06/08/2018) 20 tablet 0   No current facility-administered medications for this visit.         Physical Exam BP 136/88 (BP Location: Right Arm, Patient Position: Sitting, Cuff Size: Normal)   Pulse 98  Gen:  WD/WN, NAD Skin: The wound measures roughly 8 x 6 cm at this point.  The Adaptic was removed today.  Under this was some soupy fibrinous exudate that was removed.  Once this was removed there was an excellent granulation base with no evidence of infection.     Assessment/Plan:  HTN (hypertension) blood pressure control important in reducing the progression of atherosclerotic disease. On appropriate oral medications.  ESRD on dialysis [facility]) Impairs wound healing and complicates his medical care.  Atherosclerosis of native arteries of the extremities with ulceration ([place]) Perfusion is currently pretty good.  Lower limb ulcer, calf, right, limited to breakdown of skin ([facility]) The Adaptic is removed today.  There appears to be improvement in the wound and it is smaller than when it was grafted 2 weeks ago.  We will see him back in a week or so to reassess his wound.  This will take about 6 weeks to see the full effect of the skin graft.      Leotis Pain 06/08/2018, 11:22 AM   This note was created with Dragon medical transcription system.  Any errors from dictation are unintentional.

## 2018-06-08 NOTE — Assessment & Plan Note (Signed)
The Adaptic is removed today.  There appears to be improvement in the wound and it is smaller than when it was grafted 2 weeks ago.  We will see him back in a week or so to reassess his wound.  This will take about 6 weeks to see the full effect of the skin graft.

## 2018-06-08 NOTE — Assessment & Plan Note (Signed)
Impairs wound healing and complicates his medical care.

## 2018-06-08 NOTE — Assessment & Plan Note (Signed)
Perfusion is currently pretty good.

## 2018-06-08 NOTE — Assessment & Plan Note (Signed)
blood pressure control important in reducing the progression of atherosclerotic disease. On appropriate oral medications.  

## 2018-06-12 ENCOUNTER — Telehealth (INDEPENDENT_AMBULATORY_CARE_PROVIDER_SITE_OTHER): Payer: Self-pay | Admitting: Vascular Surgery

## 2018-06-12 ENCOUNTER — Ambulatory Visit (INDEPENDENT_AMBULATORY_CARE_PROVIDER_SITE_OTHER): Payer: Medicare Other | Admitting: Nurse Practitioner

## 2018-06-12 ENCOUNTER — Encounter (INDEPENDENT_AMBULATORY_CARE_PROVIDER_SITE_OTHER): Payer: Self-pay

## 2018-06-12 VITALS — BP 189/102 | HR 83 | Resp 16

## 2018-06-12 DIAGNOSIS — L97211 Non-pressure chronic ulcer of right calf limited to breakdown of skin: Secondary | ICD-10-CM | POA: Diagnosis not present

## 2018-06-12 NOTE — Telephone Encounter (Signed)
Dr. Lucky Cowboy saw him at the desk and gave him another an RX. This is a one-time Rx.

## 2018-06-12 NOTE — Telephone Encounter (Signed)
Pt came in office today for dressing change and per pt he forgot to ask at his appt on 06/08/18 if he could have a refill of his HYDROcodone-acetaminophen (NORCO/VICODIN) 5-325 MG tablet that was last prescribed by [facility].

## 2018-06-12 NOTE — Progress Notes (Signed)
Pt came for bandage change per provider.

## 2018-06-15 ENCOUNTER — Ambulatory Visit (INDEPENDENT_AMBULATORY_CARE_PROVIDER_SITE_OTHER): Payer: Medicare Other | Admitting: Nurse Practitioner

## 2018-06-15 ENCOUNTER — Encounter (INDEPENDENT_AMBULATORY_CARE_PROVIDER_SITE_OTHER): Payer: Self-pay | Admitting: Nurse Practitioner

## 2018-06-15 VITALS — BP 171/96 | HR 87 | Resp 14

## 2018-06-15 DIAGNOSIS — I10 Essential (primary) hypertension: Secondary | ICD-10-CM

## 2018-06-15 DIAGNOSIS — E785 Hyperlipidemia, unspecified: Secondary | ICD-10-CM | POA: Diagnosis not present

## 2018-06-15 DIAGNOSIS — Z87891 Personal history of nicotine dependence: Secondary | ICD-10-CM | POA: Diagnosis not present

## 2018-06-15 DIAGNOSIS — L97211 Non-pressure chronic ulcer of right calf limited to breakdown of skin: Secondary | ICD-10-CM | POA: Diagnosis not present

## 2018-06-15 NOTE — Progress Notes (Signed)
Subjective:    Patient ID: Johnny [name]., male    DOB: 06-19-1950, 69 y.o.   MRN: [id number] Chief Complaint  Patient presents with  . Follow-up    HPI  [name]. is a 68 y.o. male that presents today for evaluation of his right lower extremity wound and staple removal.  The right lower extremity wound appears to be granulating well.  The patient denies any fever, chills, nausea, vomiting or diarrhea.  He denies any chest pain or shortness of breath.  He states that the staples staying somewhat but otherwise no issues.  Past Medical History:  Diagnosis Date  . Anemia   . Anxiety   . CHF (congestive heart failure) ([place])   . Chronic kidney disease    esrd dialysis m/w/f  . Gout   . Hyperlipidemia   . Hypertension   . Myocardial infarction ([place])    10 years ago  . Peripheral vascular disease [facility])     Past Surgical History:  Procedure Laterality Date  . A/V SHUNTOGRAM Left 06/21/2017   Procedure: A/V SHUNTOGRAM;  Surgeon: Katha Cabal, MD;  Location: [place] CV LAB;  Service: Cardiovascular;  Laterality: Left;  . APPLICATION OF WOUND VAC Right 04/11/2018   Procedure: APPLICATION OF WOUND VAC;  Surgeon: Algernon Huxley, MD;  Location: [facility] ORS;  Service: Vascular;  Laterality: Right;  . AV FISTULA PLACEMENT Left 09/18/2015   Procedure: INSERTION OF ARTERIOVENOUS (AV) GORE-TEX GRAFT ARM ( BRACH/AXILLARY GRAFT W/ INSTANT STICK GRAFT );  Surgeon: Katha Cabal, MD;  Location: [facility] ORS;  Service: Vascular;  Laterality: Left;  . DIALYSIS FISTULA CREATION    . ESOPHAGOGASTRODUODENOSCOPY N/A 12/19/2017   Procedure: ESOPHAGOGASTRODUODENOSCOPY (EGD);  Surgeon: Lin Landsman, MD;  Location: [facility] ENDOSCOPY;  Service: Gastroenterology;  Laterality: N/A;  . LOWER EXTREMITY ANGIOGRAPHY Left 11/16/2017   Procedure: LOWER EXTREMITY ANGIOGRAPHY;  Surgeon: Algernon Huxley, MD;  Location: [place] CV LAB;  Service: Cardiovascular;  Laterality: Left;  . LOWER EXTREMITY  ANGIOGRAPHY Right 01/18/2018   Procedure: LOWER EXTREMITY ANGIOGRAPHY;  Surgeon: Algernon Huxley, MD;  Location: [place] CV LAB;  Service: Cardiovascular;  Laterality: Right;  . LOWER EXTREMITY ANGIOGRAPHY Left 04/02/2018   Procedure: LOWER EXTREMITY ANGIOGRAPHY;  Surgeon: Algernon Huxley, MD;  Location: [place] CV LAB;  Service: Cardiovascular;  Laterality: Left;  . LOWER EXTREMITY ANGIOGRAPHY Right 04/09/2018   Procedure: Lower Extremity Angiography with possible intervention;  Surgeon: Algernon Huxley, MD;  Location: [place] CV LAB;  Service: Cardiovascular;  Laterality: Right;  . PERIPHERAL VASCULAR CATHETERIZATION Left 09/01/2015   Procedure: A/V Shuntogram/Fistulagram;  Surgeon: Katha Cabal, MD;  Location: [place] CV LAB;  Service: Cardiovascular;  Laterality: Left;  . PERIPHERAL VASCULAR CATHETERIZATION N/A 09/30/2015   Procedure: A/V Shuntogram/Fistulagram with perm cathether removal;  Surgeon: Algernon Huxley, MD;  Location: [place] CV LAB;  Service: Cardiovascular;  Laterality: N/A;  . PERIPHERAL VASCULAR CATHETERIZATION Left 09/30/2015   Procedure: A/V Shunt Intervention;  Surgeon: Algernon Huxley, MD;  Location: [place] CV LAB;  Service: Cardiovascular;  Laterality: Left;  . PERIPHERAL VASCULAR CATHETERIZATION Left 12/03/2015   Procedure: Thrombectomy;  Surgeon: Algernon Huxley, MD;  Location: [place] CV LAB;  Service: Cardiovascular;  Laterality: Left;  . PERIPHERAL VASCULAR CATHETERIZATION Left 01/28/2016   Procedure: Thrombectomy;  Surgeon: Algernon Huxley, MD;  Location: [place] CV LAB;  Service: Cardiovascular;  Laterality: Left;  . PERIPHERAL VASCULAR CATHETERIZATION N/A 01/28/2016  Procedure: A/V Shuntogram/Fistulagram;  Surgeon: Algernon Huxley, MD;  Location: [place] CV LAB;  Service: Cardiovascular;  Laterality: N/A;  . SKIN SPLIT GRAFT Right 05/24/2018   Procedure: SKIN GRAFT SPLIT THICKNESS ( RIGHT CALF);  Surgeon: Algernon Huxley, MD;  Location: [facility]  ORS;  Service: Vascular;  Laterality: Right;  . WOUND DEBRIDEMENT Right 04/11/2018   Procedure: DEBRIDEMENT WOUND calf muscle and skin;  Surgeon: Algernon Huxley, MD;  Location: [facility] ORS;  Service: Vascular;  Laterality: Right;    Social History   Socioeconomic History  . Marital status: Single    Spouse name: Not on file  . Number of children: Not on file  . Years of education: Not on file  . Highest education level: Not on file  Occupational History  . Occupation: retired  Scientific laboratory technician  . Financial resource strain: Patient refused  . Food insecurity:    Worry: Patient refused    Inability: Patient refused  . Transportation needs:    Medical: Patient refused    Non-medical: Patient refused  Tobacco Use  . Smoking status: Former Smoker    Types: Cigarettes    Last attempt to quit: 05/17/2005    Years since quitting: 13.0  . Smokeless tobacco: Never Used  Substance and Sexual Activity  . Alcohol use: No  . Drug use: No  . Sexual activity: Not Currently  Lifestyle  . Physical activity:    Days per week: Patient refused    Minutes per session: Patient refused  . Stress: Not on file  Relationships  . Social connections:    Talks on phone: Patient refused    Gets together: Patient refused    Attends religious service: Patient refused    Active member of club or organization: Patient refused    Attends meetings of clubs or organizations: Patient refused    Relationship status: Patient refused  . Intimate partner violence:    Fear of current or ex partner: Patient refused    Emotionally abused: Patient refused    Physically abused: Patient refused    Forced sexual activity: Patient refused  Other Topics Concern  . Not on file  Social History Narrative  . Not on file    Family History  Problem Relation Age of Onset  . Hypertension Other   . Heart disease Other   . Diabetes Mother     Allergies  Allergen Reactions  . Shellfish Allergy Anaphylaxis     Review of  Systems   Review of Systems: Negative Unless Checked Constitutional: [] Weight loss  [] Fever  [] Chills Cardiac: [] Chest pain   []  Atrial Fibrillation  [] Palpitations   [] Shortness of breath when laying flat   [] Shortness of breath with exertion. [] Shortness of breath at rest Vascular:  [] Pain in legs with walking   [] Pain in legs with standing [] Pain in legs when laying flat   [] Claudication    [] Pain in feet when laying flat    [] History of DVT   [] Phlebitis   [] Swelling in legs   [] Varicose veins   [] Non-healing ulcers Pulmonary:   [] Uses home oxygen   [] Productive cough   [] Hemoptysis   [] Wheeze  [] COPD   [] Asthma Neurologic:  [] Dizziness   [] Seizures  [] Blackouts [] History of stroke   [] History of TIA  [] Aphasia   [] Temporary Blindness   [] Weakness or numbness in arm   [] Weakness or numbness in leg Musculoskeletal:   [] Joint swelling   [] Joint pain   [] Low back pain  []  History of  Knee Replacement [] Arthritis [] back Surgeries  []  Spinal Stenosis    Hematologic:  [] Easy bruising  [] Easy bleeding   [] Hypercoagulable state   [] Anemic Gastrointestinal:  [] Diarrhea   [] Vomiting  [] Gastroesophageal reflux/heartburn   [] Difficulty swallowing. [] Abdominal pain Genitourinary:  [x] Chronic kidney disease   [] Difficult urination  [] Anuric   [] Blood in urine [] Frequent urination  [] Burning with urination   [] Hematuria Skin:  [] Rashes   [] Ulcers [x] Wounds Psychological:  [] History of anxiety   []  History of major depression  []  Memory Difficulties     Objective:   Physical Exam  BP (!) 171/96 (BP Location: Right Arm, Patient Position: Sitting)   Pulse 87   Resp 14   Gen: WD/WN, NAD Head: [place]/AT, No temporalis wasting.  Ear/Nose/Throat: Hearing grossly intact, nares w/o erythema or drainage Eyes: PER, EOMI, sclera nonicteric.  Neck: Supple, no masses.  No JVD.  Pulmonary:  Good air movement, no use of accessory muscles.  Cardiac: RRR Vascular:  Vessel Right Left  Radial Palpable Palpable    Gastrointestinal: soft, non-distended. No guarding/no peritoneal signs.  Musculoskeletal: M/S 5/5 throughout.  No deformity or atrophy.  Neurologic: Pain and light touch intact in extremities.  Symmetrical.  Speech is fluent. Motor exam as listed above. Psychiatric: Judgment intact, Mood & affect appropriate for pt's clinical situation. Dermatologic: No Venous rashes. No Ulcers Noted.  No changes consistent with cellulitis. Lymph : No Cervical lymphadenopathy, no lichenification or skin changes of chronic lymphedema.      Assessment & Plan:   1. Lower limb ulcer, calf, right, limited to breakdown of skin ([place]) Wound appears to be healing well after skin graft.  Removed staples today.  Applied Xeroform gauze and wrapped with Kerlix.  Advised the patient that he should change his dressing 2-3 times a week at home.  Gave the patient a list of supplies that were used today that he should be able to find at [facility] or medical supply store.  He will follow-up in 6 weeks in order to evaluate the healing of the wound.  2. Hypertension, unspecified type Continue antihypertensive medications as already ordered, these medications have been reviewed and there are no changes at this time.   3. Hyperlipidemia, unspecified hyperlipidemia type Continue statin as ordered and reviewed, no changes at this time    Current Outpatient Medications on File Prior to Visit  Medication Sig Dispense Refill  . allopurinol (ZYLOPRIM) 100 MG tablet Take 100 mg by mouth daily.    [name] amLODipine (NORVASC) 10 MG tablet Take 10 mg by mouth daily.    [name] aspirin EC 81 MG tablet Take 1 tablet (81 mg total) by mouth daily. 150 tablet 2  . atorvastatin (LIPITOR) 10 MG tablet Take 1 tablet by mouth daily.    . calcitRIOL (ROCALTROL) 0.5 MCG capsule Take 0.5 mcg by mouth daily.    . calcium acetate (PHOSLO) 667 MG capsule Take 1,334 mg by mouth 3 (three) times daily with meals.    . carvedilol (COREG) 6.25 MG tablet Take 6.25 mg  by mouth every evening.    . clonazePAM (KLONOPIN) 0.5 MG tablet Take 1 tablet (0.5 mg total) by mouth as needed. 10 tablet 0  . clopidogrel (PLAVIX) 75 MG tablet Take 1 tablet (75 mg total) by mouth daily. 30 tablet 11  . colchicine 0.6 MG tablet Take 0.6 mg by mouth daily as needed (for gout flares).     . folic acid-vitamin b complex-vitamin c-selenium-zinc (DIALYVITE) 3 MG TABS tablet Take by mouth.    [name]  furosemide (LASIX) 40 MG tablet Take 40 mg by mouth daily.     [name] gabapentin (NEURONTIN) 100 MG capsule Take 300 mg by mouth 3 (three) times daily.    . hydrALAZINE (APRESOLINE) 25 MG tablet Take 25 mg by mouth daily.    [name] HYDROcodone-acetaminophen (NORCO/VICODIN) 5-325 MG tablet Take 1 tablet by mouth every 4 (four) hours as needed for moderate pain. 20 tablet 0  . irbesartan (AVAPRO) 150 MG tablet Take 150 mg by mouth 2 (two) times daily.    . metoprolol succinate (TOPROL-XL) 25 MG 24 hr tablet Take 25 mg by mouth at bedtime.    . pregabalin (LYRICA) 50 MG capsule Take 50 mg by mouth daily.    . sevelamer carbonate (RENVELA) 800 MG tablet Take 2,400 mg by mouth 3 (three) times daily with meals.    [name] apixaban (ELIQUIS) 2.5 MG TABS tablet Take 2.5 mg by mouth 2 (two) times daily.     No current facility-administered medications on file prior to visit.     There are no Patient Instructions on file for this visit. No follow-ups on file.   Kris Hartmann, NP  This note was completed with Sales executive.  Any errors are purely unintentional.

## 2018-07-06 ENCOUNTER — Ambulatory Visit (INDEPENDENT_AMBULATORY_CARE_PROVIDER_SITE_OTHER): Payer: Medicare Other | Admitting: Nurse Practitioner

## 2018-07-06 ENCOUNTER — Encounter (INDEPENDENT_AMBULATORY_CARE_PROVIDER_SITE_OTHER): Payer: Self-pay | Admitting: Nurse Practitioner

## 2018-07-06 VITALS — BP 199/101 | HR 96 | Resp 16

## 2018-07-06 DIAGNOSIS — Z87891 Personal history of nicotine dependence: Secondary | ICD-10-CM

## 2018-07-06 DIAGNOSIS — Z992 Dependence on renal dialysis: Secondary | ICD-10-CM

## 2018-07-06 DIAGNOSIS — N186 End stage renal disease: Secondary | ICD-10-CM | POA: Diagnosis not present

## 2018-07-06 DIAGNOSIS — L97211 Non-pressure chronic ulcer of right calf limited to breakdown of skin: Secondary | ICD-10-CM

## 2018-07-06 DIAGNOSIS — E785 Hyperlipidemia, unspecified: Secondary | ICD-10-CM

## 2018-07-06 DIAGNOSIS — Z79899 Other long term (current) drug therapy: Secondary | ICD-10-CM

## 2018-07-11 ENCOUNTER — Encounter (INDEPENDENT_AMBULATORY_CARE_PROVIDER_SITE_OTHER): Payer: Self-pay

## 2018-07-11 ENCOUNTER — Telehealth (INDEPENDENT_AMBULATORY_CARE_PROVIDER_SITE_OTHER): Payer: Self-pay

## 2018-07-11 NOTE — Telephone Encounter (Signed)
Spoke with Ms. Johnny [name] and gave her the patient's pre-procedure instructions and his surgery day and pre-op day and time.  07/19/2018 surgery and pre-op is 07/17/2018 with 12:45 am arrival time. This information will be mailed out to the patient.

## 2018-07-15 ENCOUNTER — Encounter (INDEPENDENT_AMBULATORY_CARE_PROVIDER_SITE_OTHER): Payer: Self-pay | Admitting: Nurse Practitioner

## 2018-07-15 NOTE — Progress Notes (Signed)
SUBJECTIVE:  Patient ID: Johnny Bucco., male    DOB: [birth year], 68 y.o.   MRN: [id number] Chief Complaint  Patient presents with  . Follow-up    3week wound check    HPI  Johnny Butterfield. is a 68 y.o. male that presents today for evaluation of his previous skin graft on his right lower extremity as well as evaluation for new dialysis access.  Today the wound looks well it is still healing however.  The patient denies any pain within the wound.  He denies any fever, chills, nausea, vomiting or diarrhea.  He denies any purulent drainage.  Previous vein mapping shows adequate access for a right brachial axillary graft.  Past Medical History:  Diagnosis Date  . Anemia   . Anxiety   . CHF (congestive heart failure) ([place])   . Chronic kidney disease    esrd dialysis m/w/f  . Gout   . Hyperlipidemia   . Hypertension   . Myocardial infarction ([place])    10 years ago  . Peripheral vascular disease [facility])     Past Surgical History:  Procedure Laterality Date  . A/V SHUNTOGRAM Left 06/21/2017   Procedure: A/V SHUNTOGRAM;  Surgeon: Katha Cabal, MD;  Location: [place] [facility];  Service: Cardiovascular;  Laterality: Left;  . APPLICATION OF WOUND VAC Right 04/11/2018   Procedure: APPLICATION OF WOUND VAC;  Surgeon: Algernon Huxley, MD;  Location: [facility] ORS;  Service: Vascular;  Laterality: Right;  . AV FISTULA PLACEMENT Left 09/18/2015   Procedure: INSERTION OF ARTERIOVENOUS (AV) GORE-TEX GRAFT ARM ( BRACH/AXILLARY GRAFT W/ INSTANT STICK GRAFT );  Surgeon: Katha Cabal, MD;  Location: [facility] ORS;  Service: Vascular;  Laterality: Left;  . DIALYSIS FISTULA CREATION    . ESOPHAGOGASTRODUODENOSCOPY N/A 12/19/2017   Procedure: ESOPHAGOGASTRODUODENOSCOPY (EGD);  Surgeon: Lin Landsman, MD;  Location: [facility] ENDOS[facility];  Service: Gastroenterology;  Laterality: N/A;  . LOWER EXTREMITY ANGIOGRAPHY Left 11/16/2017   Procedure: LOWER EXTREMITY ANGIOGRAPHY;  Surgeon: Algernon Huxley, MD;   Location: [place] [facility];  Service: Cardiovascular;  Laterality: Left;  . LOWER EXTREMITY ANGIOGRAPHY Right 01/18/2018   Procedure: LOWER EXTREMITY ANGIOGRAPHY;  Surgeon: Algernon Huxley, MD;  Location: [place] [facility];  Service: Cardiovascular;  Laterality: Right;  . LOWER EXTREMITY ANGIOGRAPHY Left 04/02/2018   Procedure: LOWER EXTREMITY ANGIOGRAPHY;  Surgeon: Algernon Huxley, MD;  Location: [place] [facility];  Service: Cardiovascular;  Laterality: Left;  . LOWER EXTREMITY ANGIOGRAPHY Right 04/09/2018   Procedure: Lower Extremity Angiography with possible intervention;  Surgeon: Algernon Huxley, MD;  Location: [place] [facility];  Service: Cardiovascular;  Laterality: Right;  . PERIPHERAL VASCULAR CATHETERIZATION Left 09/01/2015   Procedure: A/V Shuntogram/Fistulagram;  Surgeon: Katha Cabal, MD;  Location: [place] [facility];  Service: Cardiovascular;  Laterality: Left;  . PERIPHERAL VASCULAR CATHETERIZATION N/A 09/30/2015   Procedure: A/V Shuntogram/Fistulagram with perm cathether removal;  Surgeon: Algernon Huxley, MD;  Location: [place] [facility];  Service: Cardiovascular;  Laterality: N/A;  . PERIPHERAL VASCULAR CATHETERIZATION Left 09/30/2015   Procedure: A/V Shunt Intervention;  Surgeon: Algernon Huxley, MD;  Location: [place] [facility];  Service: Cardiovascular;  Laterality: Left;  . PERIPHERAL VASCULAR CATHETERIZATION Left 12/03/2015   Procedure: Thrombectomy;  Surgeon: Algernon Huxley, MD;  Location: [place] [facility];  Service: Cardiovascular;  Laterality: Left;  . PERIPHERAL VASCULAR CATHETERIZATION Left 01/28/2016   Procedure: Thrombectomy;  Surgeon: Algernon Huxley, MD;  Location: [facility]  INV[facility] LAB;  Service: Cardiovascular;  Laterality: Left;  . PERIPHERAL VASCULAR CATHETERIZATION N/A 01/28/2016   Procedure: A/V Shuntogram/Fistulagram;  Surgeon: Algernon Huxley, MD;  Location: [place] [facility];  Service: Cardiovascular;  Laterality: N/A;  . SKIN SPLIT GRAFT Right 05/24/2018    Procedure: SKIN GRAFT SPLIT THICKNESS ( RIGHT CALF);  Surgeon: Algernon Huxley, MD;  Location: [facility] ORS;  Service: Vascular;  Laterality: Right;  . WOUND DEBRIDEMENT Right 04/11/2018   Procedure: DEBRIDEMENT WOUND calf muscle and skin;  Surgeon: Algernon Huxley, MD;  Location: [facility] ORS;  Service: Vascular;  Laterality: Right;    Social History   Socioeconomic History  . Marital status: Single    Spouse name: Not on file  . Number of children: Not on file  . Years of education: Not on file  . Highest education level: Not on file  Occupational History  . Occupation: retired  Scientific laboratory technician  . Financial resource strain: Patient refused  . Food insecurity:    Worry: Patient refused    Inability: Patient refused  . Transportation needs:    Medical: Patient refused    Non-medical: Patient refused  Tobacco Use  . Smoking status: Former Smoker    Types: Cigarettes    Last attempt to quit: 05/17/2005    Years since quitting: 13.1  . Smokeless tobacco: Never Used  Substance and Sexual Activity  . Alcohol use: No  . Drug use: No  . Sexual activity: Not Currently  Lifestyle  . Physical activity:    Days per week: Patient refused    Minutes per session: Patient refused  . Stress: Not on file  Relationships  . Social connections:    Talks on phone: Patient refused    Gets together: Patient refused    Attends religious service: Patient refused    Active member of club or organization: Patient refused    Attends meetings of clubs or organizations: Patient refused    Relationship status: Patient refused  . Intimate partner violence:    Fear of current or ex partner: Patient refused    Emotionally abused: Patient refused    Physically abused: Patient refused    Forced sexual activity: Patient refused  Other Topics Concern  . Not on file  Social History Narrative  . Not on file    Family History  Problem Relation Age of Onset  . Hypertension Other   . Heart disease Other   . Diabetes  Mother     Allergies  Allergen Reactions  . Shellfish Allergy Anaphylaxis     Review of Systems   Review of Systems: Negative Unless Checked Constitutional: [] Weight loss  [] Fever  [] Chills Cardiac: [] Chest pain   []  Atrial Fibrillation  [] Palpitations   [] Shortness of breath when laying flat   [] Shortness of breath with exertion. [] Shortness of breath at rest Vascular:  [] Pain in legs with walking   [] Pain in legs with standing [] Pain in legs when laying flat   [] Claudication    [] Pain in feet when laying flat    [] History of DVT   [] Phlebitis   [] Swelling in legs   [] Varicose veins   [] Non-healing ulcers Pulmonary:   [] Uses home oxygen   [] Productive cough   [] Hemoptysis   [] Wheeze  [] COPD   [] Asthma Neurologic:  [] Dizziness   [] Seizures  [] Blackouts [] History of stroke   [] History of TIA  [] Aphasia   [] Temporary Blindness   [] Weakness or numbness in arm   [x] Weakness or numbness in leg  Musculoskeletal:   [] Joint swelling   [] Joint pain   [] Low back pain  []  History of Knee Replacement [] Arthritis [] back Surgeries  []  Spinal Stenosis    Hematologic:  [] Easy bruising  [] Easy bleeding   [] Hypercoagulable state   [x] Anemic Gastrointestinal:  [] Diarrhea   [] Vomiting  [] Gastroesophageal reflux/heartburn   [] Difficulty swallowing. [] Abdominal pain Genitourinary:  [x] Chronic kidney disease   [] Difficult urination  [] Anuric   [] Blood in urine [] Frequent urination  [] Burning with urination   [] Hematuria Skin:  [] Rashes   [] Ulcers [x] Wounds Psychological:  [] History of anxiety   []  History of major depression  []  Memory Difficulties      OBJECTIVE:   Physical Exam  BP (!) 199/101 (BP Location: Right Arm)   Pulse 96   Resp 16   Gen: WD/WN, NAD Head: [place]/AT, No temporalis wasting.  Ear/Nose/Throat: Hearing grossly intact, nares w/o erythema or drainage Eyes: PER, EOMI, sclera nonicteric.  Neck: Supple, no masses.  No JVD.  Pulmonary:  Good air movement, no use of accessory muscles.    Cardiac: RRR Vascular:  Vessel Right Left  Radial Palpable Palpable   Gastrointestinal: soft, non-distended. No guarding/no peritoneal signs.  Musculoskeletal: Bilateral lower extremity weakness No deformity or atrophy.  Neurologic: Pain and light touch intact in extremities.  Symmetrical.  Speech is fluent. Motor exam as listed above. Psychiatric: Judgment intact, Mood & affect appropriate for pt's clinical situation. Dermatologic: No Venous rashes. No Ulcers Noted.  No changes consistent with cellulitis. Lymph : No Cervical lymphadenopathy, no lichenification or skin changes of chronic lymphedema.       ASSESSMENT AND PLAN:  1. Lower limb ulcer, calf, right, limited to breakdown of skin ([place]) The wound is continuing to heal well.  We will evaluated on his upcoming visit following his dialysis access placement.  2. ESRD on dialysis [facility]) Recommend:  At this time the patient does not have appropriate extremity access for dialysis  Patient should have a brachial axillary graft created.  The risks, benefits and alternative therapies were reviewed in detail with the patient.  All questions were answered.  The patient agrees to proceed with surgery.    3. Hyperlipidemia, unspecified hyperlipidemia type Continue statin as ordered and reviewed, no changes at this time    Current Outpatient Medications on File Prior to Visit  Medication Sig Dispense Refill  . allopurinol (ZYLOPRIM) 100 MG tablet Take 100 mg by mouth daily.    [name] amLODipine (NORVASC) 10 MG tablet Take 10 mg by mouth daily.    [name] aspirin EC 81 MG tablet Take 1 tablet (81 mg total) by mouth daily. 150 tablet 2  . atorvastatin (LIPITOR) 10 MG tablet Take 1 tablet by mouth daily.    . calcitRIOL (ROCALTROL) 0.5 MCG capsule Take 0.5 mcg by mouth daily.    . calcium acetate (PHOSLO) 667 MG capsule Take 1,334 mg by mouth 3 (three) times daily with meals.    . carvedilol (COREG) 6.25 MG tablet Take 6.25 mg by mouth every  evening.    . clonazePAM (KLONOPIN) 0.5 MG tablet Take 1 tablet (0.5 mg total) by mouth as needed. 10 tablet 0  . clopidogrel (PLAVIX) 75 MG tablet Take 1 tablet (75 mg total) by mouth daily. 30 tablet 11  . colchicine 0.6 MG tablet Take 0.6 mg by mouth daily as needed (for gout flares).     . folic acid-vitamin b complex-vitamin c-selenium-zinc (DIALYVITE) 3 MG TABS tablet Take by mouth.    . furosemide (LASIX) 40 MG  tablet Take 40 mg by mouth daily.     [name] gabapentin (NEURONTIN) 100 MG capsule Take 300 mg by mouth 3 (three) times daily.    . hydrALAZINE (APRESOLINE) 25 MG tablet Take 25 mg by mouth daily.    [name] HYDROcodone-acetaminophen (NORCO/VICODIN) 5-325 MG tablet Take 1 tablet by mouth every 4 (four) hours as needed for moderate pain. 20 tablet 0  . irbesartan (AVAPRO) 150 MG tablet Take 150 mg by mouth 2 (two) times daily.    . metoprolol succinate (TOPROL-XL) 25 MG 24 hr tablet Take 25 mg by mouth at bedtime.    . pregabalin (LYRICA) 50 MG capsule Take 50 mg by mouth daily.    . sevelamer carbonate (RENVELA) 800 MG tablet Take 2,400 mg by mouth 3 (three) times daily with meals.    [name] apixaban (ELIQUIS) 2.5 MG TABS tablet Take 2.5 mg by mouth 2 (two) times daily.     No current facility-administered medications on file prior to visit.     There are no Patient Instructions on file for this visit. No follow-ups on file.   Kris Hartmann, NP  This note was completed with Sales executive.  Any errors are purely unintentional.

## 2018-07-16 ENCOUNTER — Other Ambulatory Visit (INDEPENDENT_AMBULATORY_CARE_PROVIDER_SITE_OTHER): Payer: Self-pay | Admitting: Nurse Practitioner

## 2018-07-17 ENCOUNTER — Other Ambulatory Visit: Payer: Self-pay

## 2018-07-17 ENCOUNTER — Encounter
Admission: RE | Admit: 2018-07-17 | Discharge: 2018-07-17 | Disposition: A | Discharge status: Home / Self Care | Payer: Medicare Other | Source: Ambulatory Visit | Attending: Vascular Surgery | Admitting: Vascular Surgery

## 2018-07-17 DIAGNOSIS — Z01818 Encounter for other preprocedural examination: Secondary | ICD-10-CM | POA: Insufficient documentation

## 2018-07-17 HISTORY — DX: Myoneural disorder, unspecified: G70.9

## 2018-07-17 LAB — BASIC METABOLIC PANEL
Anion gap: 16 — ABNORMAL HIGH (ref 5–15)
BUN: 34 mg/dL — ABNORMAL HIGH (ref 8–23)
CHLORIDE: 100 mmol/L (ref 98–111)
CO2: 24 mmol/L (ref 22–32)
Calcium: 9.7 mg/dL (ref 8.9–10.3)
Creatinine, Ser: 7.11 mg/dL — ABNORMAL HIGH (ref 0.61–1.24)
GFR calc Af Amer: 8 mL/min — ABNORMAL LOW (ref >60)
GFR calc non Af Amer: 7 mL/min — ABNORMAL LOW (ref >60)
Glucose, Bld: 87 mg/dL (ref 70–99)
Potassium: 4.1 mmol/L (ref 3.5–5.1)
Sodium: 140 mmol/L (ref 135–145)

## 2018-07-17 LAB — TYPE AND SCREEN
ABO/RH(D): B NEG
Antibody Screen: NEGATIVE

## 2018-07-17 LAB — CBC WITH DIFFERENTIAL/PLATELET
Abs Immature Granulocytes: 0.05 10*3/uL (ref 0.00–0.07)
Basophils Absolute: 0.1 10*3/uL (ref 0.0–0.1)
Basophils Relative: 1 %
Eosinophils Absolute: 0.2 10*3/uL (ref 0.0–0.5)
Eosinophils Relative: 2 %
HEMATOCRIT: 38.4 % — AB (ref 39.0–52.0)
Hemoglobin: 11.9 g/dL — ABNORMAL LOW (ref 13.0–17.0)
Immature Granulocytes: 0 %
Lymphocytes Relative: 12 %
Lymphs Abs: 1.5 10*3/uL (ref 0.7–4.0)
MCH: 29.7 pg (ref 26.0–34.0)
MCHC: 31 g/dL (ref 30.0–36.0)
MCV: 95.8 fL (ref 80.0–100.0)
Monocytes Absolute: 1.2 10*3/uL — ABNORMAL HIGH (ref 0.1–1.0)
Monocytes Relative: 9 %
Neutro Abs: 9.3 10*3/uL — ABNORMAL HIGH (ref 1.7–7.7)
Neutrophils Relative %: 76 %
Platelets: 274 10*3/uL (ref 150–400)
RBC: 4.01 MIL/uL — ABNORMAL LOW (ref 4.22–5.81)
RDW: 13.4 % (ref 11.5–15.5)
WBC: 12.2 10*3/uL — ABNORMAL HIGH (ref 4.0–10.5)
nRBC: 0 % (ref 0.0–0.2)

## 2018-07-17 LAB — PROTIME-INR
INR: 1.1 (ref 0.8–1.2)
Prothrombin Time: 14.1 seconds (ref 11.4–15.2)

## 2018-07-17 LAB — SURGICAL PCR SCREEN
MRSA, PCR: NEGATIVE
Staphylococcus aureus: POSITIVE — AB

## 2018-07-17 LAB — APTT: aPTT: 37 seconds — ABNORMAL HIGH (ref 24–36)

## 2018-07-17 NOTE — Patient Instructions (Signed)
INSTRUCTIONS FOR SURGERY     Your surgery is scheduled for:  Thursday, July 19, 2018      To find out your arrival time for the day of surgery,          please call [phone] between 1 pm and 3 pm on : Wednesday, July 18, 2018     When you arrive for surgery, report to the [facility].       Do NOT stop on the first floor to register.    REMEMBER: Instructions that are not followed completely may result in serious medical risk,  up to and including death, or upon the discretion of your surgeon and anesthesiologist,            your surgery may need to be rescheduled.  __X__ 1. Do not eat food after midnight the night before your procedure.                    No gum, candy, lozenger, tic tacs, tums or hard candies.                  ABSOLUTELY NOTHING SOLID IN YOUR MOUTH AFTER MIDNIGHT                    You may drink unlimited clear liquids up to 2 hours before you are scheduled to arrive for surgery.                   Do not drink anything within those 2 hours unless you need to take medicine, then take the                   smallest amount you need.  Clear liquids include:  water, apple juice without pulp,                   any flavor Gatorade, Black coffee, black tea.  Sugar may be added but no dairy/ honey /lemon.                        Broth and jello is not considered a clear liquid.  __x__  2. On the morning of surgery, please brush your teeth with toothpaste and water. You may rinse with                  mouthwash if you wish but DO NOT SWALLOW TOOTHPASTE OR MOUTHWASH  __X___3. NO alcohol for 24 hours before or after surgery.  __x___ 4.  Do NOT smoke or use e-cigarettes for 24 HOURS PRIOR TO SURGERY.                      DO NOT  Use any chewable tobacco products for at least 6 hours prior to surgery.  __x___ 5. If you start any new medication after this appointment and prior to surgery, please                Bring it with you on the day of surgery.  ___x__ 6. Notify your doctor if there is any change in your medical  condition, such as fever, infection, vomitting, diarrhea.  __x___ 7.  USE the CHG  WIPES as instructed, the night before surgery and the day of surgery.                   Once you have washed with this soap, do NOT use any of the following: Powders, perfumes or lotions.                   Please do not wear make up, hairpins, clips or nail polish. You MAY wear deodorant.                   Men may shave their face and neck.  Women need to shave 48 hours prior to surgery.                   DO NOT wear ANY jewelry on the day of surgery. If there are rings that are too tight to remove easily,                    please address this prior to the surgery day. Piercings need to be removed.                                                                     NO METAL ON YOUR BODY.                    Do NOT bring any valuables.                      If you came to Pre-Admit testing then you will not need license, insurance card or credit card.                      If you will be staying overnight, please either leave your things in the car or have your family be                     responsible for these items.                     [facility] IS NOT RESPONSIBLE FOR BELONGINGS OR VALUABLES.  ___X__ 8. DO NOT wear contact lenses on surgery day.  You may not have dentures,                     Hearing aides, contacts or glasses in the operating room. These items can be                    Placed in the Recovery Room to receive immediately after surgery.  __x___ 9. IF YOU ARE SCHEDULED TO GO HOME ON THE SAME DAY, YOU MUST                   Have someone to drive you home and to stay with you  for the first 24 hours.                    Have an arrangement prior to arriving on surgery day.  ___x__ 10. Take the following medications on the morning of surgery  with a sip of water:                               1. AMLODIPINE/NORVASC                     2. ALLOPURINAL                     3. KLONOPIN                     4.                 _____ 11.  Follow any instructions provided to you by your surgeon.                        Such as enema, clear liquid bowel prep  __X__  12. STOP COUMADIN / PLAVIX / ELIQUIS / ASPIRIN AS OF: TODAY.                      YOU STATED THAT YOU NO LONGER TAKE ANY BLOOD THINNERS                       THIS INCLUDES BC POWDERS / GOODIES POWDER  __x___ 43. STOP Anti-inflammatories as of: TODAY                      This includes IBUPROFEN / MOTRIN / ADVIL / ALEVE/ NAPROXYN                    YOU MAY TAKE TYLENOL ANY TIME PRIOR TO SURGERY.  _____ 14.  Stop supplements until after surgery.                     This includes:N/A                 You may continue taking Vitamin B12 / Vitamin D3 but do not take on the morning of surgery.  _____ 15. Bring your CPAP machine into preop with you on the morning of surgery.  ______16.  Stop Metformin 2 full days prior to surgery.  Stop on: N/A                     TAKE 1/2 OF USUAL INSULIN DOSE ON THE EVENING PRIOR TO SURGERY.                     Do NOT take any diabetes medications on surgery day.  ______17.  Continue to take the following medications but do not take on the morning of surgery:                        AVAPRO / LASIX / PHOSLO / DIALYVITE / RENVELA / CALCITROL  ______18. If staying overnight, please have appropriate shoes to wear to be able to walk around the unit.                   Wear clean and comfortable clothing to the hospital.                 CONTINUE TAKING ALL NIGHT TIME MEDICATIONS AS USUAL.  THIS INCLUDES:  METOPROLOL / COREG / LIPITOR

## 2018-07-17 NOTE — Pre-Procedure Instructions (Signed)
Patient seems to be somewhat confused about his current medication list. He states that he has been stopped from taking Eliquis, Plavix and Aspirin d/t his bleeding times and unable to clot quickly after dialysis.  He also states that he does not take Gabapentin or Lyrica and wants to know what he can take for his neuropathy pain in his right leg.  We discussed using Vicodin as an additional pain mediation source and he states that he has none of that either. Patient also wanted to know what kind of limitations would this surgery pose for his right arm since he does everything with this arm.  Suggested that this be a conversation with Dr. Lucky Cowboy.

## 2018-07-17 NOTE — Pre-Procedure Instructions (Signed)
LABS FAXED TO DR Lucky Cowboy

## 2018-07-18 ENCOUNTER — Other Ambulatory Visit (INDEPENDENT_AMBULATORY_CARE_PROVIDER_SITE_OTHER): Payer: Self-pay | Admitting: Nurse Practitioner

## 2018-07-18 MED ORDER — VANCOMYCIN HCL IN DEXTROSE 1-5 GM/200ML-% IV SOLN
1000.0000 mg | Freq: Once | INTRAVENOUS | Status: AC
  Administered 2018-07-19: 1000 mg via INTRAVENOUS

## 2018-07-18 NOTE — Pre-Procedure Instructions (Signed)
Positive staph faxed to dr dew and ekg faxed to dr Clayborn Bigness. Clearance from 07/10/18  And ekg from 05/28/18 on chart

## 2018-07-19 ENCOUNTER — Ambulatory Visit
Admission: RE | Admit: 2018-07-19 | Discharge: 2018-07-19 | Disposition: A | Discharge status: Home / Self Care | Payer: Medicare Other | Source: Home / Self Care | Attending: Vascular Surgery | Admitting: Vascular Surgery

## 2018-07-19 ENCOUNTER — Other Ambulatory Visit: Payer: Self-pay

## 2018-07-19 ENCOUNTER — Ambulatory Visit: Payer: Medicare Other | Admitting: Anesthesiology

## 2018-07-19 ENCOUNTER — Encounter
Admission: RE | Disposition: A | Discharge status: Home / Self Care | Payer: Self-pay | Source: Home / Self Care | Attending: Vascular Surgery

## 2018-07-19 DIAGNOSIS — I132 Hypertensive heart and chronic kidney disease with heart failure and with stage 5 chronic kidney disease, or end stage renal disease: Secondary | ICD-10-CM | POA: Insufficient documentation

## 2018-07-19 DIAGNOSIS — N186 End stage renal disease: Secondary | ICD-10-CM | POA: Insufficient documentation

## 2018-07-19 DIAGNOSIS — Z7901 Long term (current) use of anticoagulants: Secondary | ICD-10-CM | POA: Insufficient documentation

## 2018-07-19 DIAGNOSIS — Z7902 Long term (current) use of antithrombotics/antiplatelets: Secondary | ICD-10-CM | POA: Insufficient documentation

## 2018-07-19 DIAGNOSIS — M109 Gout, unspecified: Secondary | ICD-10-CM | POA: Insufficient documentation

## 2018-07-19 DIAGNOSIS — E11621 Type 2 diabetes mellitus with foot ulcer: Secondary | ICD-10-CM | POA: Insufficient documentation

## 2018-07-19 DIAGNOSIS — Z992 Dependence on renal dialysis: Secondary | ICD-10-CM

## 2018-07-19 DIAGNOSIS — E785 Hyperlipidemia, unspecified: Secondary | ICD-10-CM | POA: Insufficient documentation

## 2018-07-19 DIAGNOSIS — I739 Peripheral vascular disease, unspecified: Secondary | ICD-10-CM

## 2018-07-19 DIAGNOSIS — Z7982 Long term (current) use of aspirin: Secondary | ICD-10-CM

## 2018-07-19 DIAGNOSIS — L97211 Non-pressure chronic ulcer of right calf limited to breakdown of skin: Secondary | ICD-10-CM | POA: Insufficient documentation

## 2018-07-19 DIAGNOSIS — Z833 Family history of diabetes mellitus: Secondary | ICD-10-CM

## 2018-07-19 DIAGNOSIS — I252 Old myocardial infarction: Secondary | ICD-10-CM

## 2018-07-19 DIAGNOSIS — Z79899 Other long term (current) drug therapy: Secondary | ICD-10-CM

## 2018-07-19 DIAGNOSIS — Z87891 Personal history of nicotine dependence: Secondary | ICD-10-CM | POA: Insufficient documentation

## 2018-07-19 DIAGNOSIS — I5032 Chronic diastolic (congestive) heart failure: Secondary | ICD-10-CM

## 2018-07-19 DIAGNOSIS — S81801A Unspecified open wound, right lower leg, initial encounter: Secondary | ICD-10-CM

## 2018-07-19 DIAGNOSIS — E1122 Type 2 diabetes mellitus with diabetic chronic kidney disease: Secondary | ICD-10-CM | POA: Insufficient documentation

## 2018-07-19 HISTORY — PX: AV FISTULA PLACEMENT: SHX1204

## 2018-07-19 LAB — GLUCOSE, CAPILLARY
Glucose-Capillary: 97 mg/dL (ref 70–99)
Glucose-Capillary: 73 mg/dL (ref 70–99)

## 2018-07-19 LAB — POCT I-STAT 4, (NA,K, GLUC, HGB,HCT)
GLUCOSE: 64 mg/dL — AB (ref 70–99)
HCT: 28 % — ABNORMAL LOW (ref 39.0–52.0)
Hemoglobin: 9.5 g/dL — ABNORMAL LOW (ref 13.0–17.0)
POTASSIUM: 3.4 mmol/L — AB (ref 3.5–5.1)
Sodium: 142 mmol/L (ref 135–145)

## 2018-07-19 SURGERY — INSERTION OF ARTERIOVENOUS (AV) GORE-TEX GRAFT ARM
Anesthesia: General | Laterality: Right

## 2018-07-19 MED ORDER — HEPARIN SODIUM (PORCINE) 5000 UNIT/ML IJ SOLN
INTRAMUSCULAR | Status: AC
  Filled 2018-07-19: qty 1

## 2018-07-19 MED ORDER — FENTANYL CITRATE (PF) 100 MCG/2ML IJ SOLN
INTRAMUSCULAR | Status: AC
  Filled 2018-07-19: qty 2

## 2018-07-19 MED ORDER — FENTANYL CITRATE (PF) 100 MCG/2ML IJ SOLN: 25.0000 ug | INTRAMUSCULAR | Status: DC | PRN

## 2018-07-19 MED ORDER — SODIUM CHLORIDE 0.9 % IV SOLN
INTRAVENOUS | Status: DC
  Administered 2018-07-19: 11:00:00 via INTRAVENOUS

## 2018-07-19 MED ORDER — CHLORHEXIDINE GLUCONATE CLOTH 2 % EX PADS: 6.0000 | MEDICATED_PAD | Freq: Once | CUTANEOUS | Status: DC

## 2018-07-19 MED ORDER — EVICEL 5 ML EX KIT
PACK | CUTANEOUS | Status: DC | PRN
  Administered 2018-07-19: 5 mL

## 2018-07-19 MED ORDER — PROPOFOL 10 MG/ML IV BOLUS
INTRAVENOUS | Status: AC
  Filled 2018-07-19: qty 20

## 2018-07-19 MED ORDER — BUPIVACAINE-EPINEPHRINE (PF) 0.5% -1:200000 IJ SOLN
INTRAMUSCULAR | Status: AC
  Filled 2018-07-19: qty 30

## 2018-07-19 MED ORDER — VANCOMYCIN HCL IN DEXTROSE 1-5 GM/200ML-% IV SOLN
INTRAVENOUS | Status: AC
  Administered 2018-07-19: 1000 mg via INTRAVENOUS
  Filled 2018-07-19: qty 200

## 2018-07-19 MED ORDER — FAMOTIDINE 20 MG PO TABS
ORAL_TABLET | ORAL | Status: AC
  Administered 2018-07-19: 20 mg via ORAL
  Filled 2018-07-19: qty 1

## 2018-07-19 MED ORDER — LACTATED RINGERS IV SOLN: INTRAVENOUS | Status: DC | PRN

## 2018-07-19 MED ORDER — AMLODIPINE BESYLATE 10 MG PO TABS
10.0000 mg | ORAL_TABLET | Freq: Once | ORAL | Status: AC
  Administered 2018-07-19: 10 mg via ORAL
  Filled 2018-07-19: qty 1

## 2018-07-19 MED ORDER — HEPARIN SODIUM (PORCINE) 1000 UNIT/ML IJ SOLN
INTRAMUSCULAR | Status: DC | PRN
  Administered 2018-07-19: 3000 [IU] via INTRAVENOUS

## 2018-07-19 MED ORDER — HYDROCODONE-ACETAMINOPHEN 5-325 MG PO TABS: 1.0000 | ORAL_TABLET | ORAL | 0 refills | Status: DC | PRN

## 2018-07-19 MED ORDER — PROPOFOL 10 MG/ML IV BOLUS
INTRAVENOUS | Status: DC | PRN
  Administered 2018-07-19: 80 mg via INTRAVENOUS

## 2018-07-19 MED ORDER — LIDOCAINE HCL (CARDIAC) PF 100 MG/5ML IV SOSY
PREFILLED_SYRINGE | INTRAVENOUS | Status: DC | PRN
  Administered 2018-07-19: 80 mg via INTRAVENOUS

## 2018-07-19 MED ORDER — ONDANSETRON HCL 4 MG/2ML IJ SOLN
INTRAMUSCULAR | Status: DC | PRN
  Administered 2018-07-19: 4 mg via INTRAVENOUS

## 2018-07-19 MED ORDER — EVICEL 5 ML EX KIT
PACK | CUTANEOUS | Status: AC
  Filled 2018-07-19: qty 1

## 2018-07-19 MED ORDER — ONDANSETRON HCL 4 MG/2ML IJ SOLN: 4.0000 mg | Freq: Once | INTRAMUSCULAR | Status: DC | PRN

## 2018-07-19 MED ORDER — SUGAMMADEX SODIUM 200 MG/2ML IV SOLN
INTRAVENOUS | Status: AC
  Filled 2018-07-19: qty 2

## 2018-07-19 MED ORDER — FENTANYL CITRATE (PF) 100 MCG/2ML IJ SOLN
INTRAMUSCULAR | Status: DC | PRN
  Administered 2018-07-19: 50 ug via INTRAVENOUS
  Administered 2018-07-19: 25 ug via INTRAVENOUS
  Administered 2018-07-19: 50 ug via INTRAVENOUS
  Administered 2018-07-19: 25 ug via INTRAVENOUS
  Administered 2018-07-19: 50 ug via INTRAVENOUS

## 2018-07-19 MED ORDER — FAMOTIDINE 20 MG PO TABS
20.0000 mg | ORAL_TABLET | Freq: Once | ORAL | Status: AC
  Administered 2018-07-19: 20 mg via ORAL

## 2018-07-19 MED ORDER — MIDAZOLAM HCL 2 MG/2ML IJ SOLN
INTRAMUSCULAR | Status: AC
  Filled 2018-07-19: qty 2

## 2018-07-19 SURGICAL SUPPLY — 60 items
BAG DECANTER FOR FLEXI CONT (MISCELLANEOUS) ×2 IMPLANT
BLADE SURG SZ11 CARB STEEL (BLADE) ×2 IMPLANT
BOOT SUTURE AID YELLOW STND (SUTURE) ×2 IMPLANT
BRUSH SCRUB EZ  4% CHG (MISCELLANEOUS) ×1
BRUSH SCRUB EZ 4% CHG (MISCELLANEOUS) ×1 IMPLANT
CANISTER SUCT 1200ML W/VALVE (MISCELLANEOUS) ×2 IMPLANT
CHLORAPREP W/TINT 26 (MISCELLANEOUS) ×2 IMPLANT
CLIP SPRNG 6 S-JAW DBL (CLIP) ×1 IMPLANT
CLIP SPRNG 6MM S-JAW DBL (CLIP) ×2
COVER WAND RF STERILE (DRAPES) ×2 IMPLANT
DECANTER SPIKE VIAL GLASS SM (MISCELLANEOUS) ×2 IMPLANT
DERMABOND ADVANCED (GAUZE/BANDAGES/DRESSINGS) ×1
DERMABOND ADVANCED .7 DNX12 (GAUZE/BANDAGES/DRESSINGS) ×1 IMPLANT
ELECT CAUTERY BLADE 6.4 (BLADE) ×2 IMPLANT
ELECT REM PT RETURN 9FT ADLT (ELECTROSURGICAL) ×2
ELECTRODE REM PT RTRN 9FT ADLT (ELECTROSURGICAL) ×1 IMPLANT
GEL ULTRASOUND 20GR AQUASONIC (MISCELLANEOUS) IMPLANT
GLOVE BIO SURGEON STRL SZ7 (GLOVE) ×4 IMPLANT
GLOVE INDICATOR 7.5 STRL GRN (GLOVE) ×2 IMPLANT
GOWN STRL REUS W/ TWL LRG LVL3 (GOWN DISPOSABLE) ×1 IMPLANT
GOWN STRL REUS W/ TWL XL LVL3 (GOWN DISPOSABLE) ×2 IMPLANT
GOWN STRL REUS W/TWL LRG LVL3 (GOWN DISPOSABLE) ×1
GOWN STRL REUS W/TWL XL LVL3 (GOWN DISPOSABLE) ×2
GRAFT COLLAGEN VASCULAR 7X45 (Vascular Products) ×1 IMPLANT
HEMOSTAT SURGICEL 2X3 (HEMOSTASIS) ×3 IMPLANT
IV NS 500ML (IV SOLUTION) ×1
IV NS 500ML BAXH (IV SOLUTION) ×1 IMPLANT
KIT TURNOVER KIT A (KITS) ×2 IMPLANT
LABEL OR SOLS (LABEL) ×2 IMPLANT
LOOP RED MAXI  1X406MM (MISCELLANEOUS) ×1
LOOP VESSEL MAXI 1X406 RED (MISCELLANEOUS) ×1 IMPLANT
LOOP VESSEL MINI 0.8X406 BLUE (MISCELLANEOUS) ×1 IMPLANT
LOOPS BLUE MINI 0.8X406MM (MISCELLANEOUS) ×1
NDL FILTER BLUNT 18X1 1/2 (NEEDLE) ×1 IMPLANT
NDL HYPO 30X.5 LL (NEEDLE) IMPLANT
NEEDLE FILTER BLUNT 18X 1/2SAF (NEEDLE) ×1
NEEDLE FILTER BLUNT 18X1 1/2 (NEEDLE) ×1 IMPLANT
NEEDLE HYPO 30X.5 LL (NEEDLE) IMPLANT
NS IRRIG 500ML POUR BTL (IV SOLUTION) ×2 IMPLANT
PACK EXTREMITY ARMC (MISCELLANEOUS) ×2 IMPLANT
PAD PREP 24X41 OB/GYN DISP (PERSONAL CARE ITEMS) ×2 IMPLANT
SOLUTION CELL SAVER (CLIP) ×1 IMPLANT
STOCKINETTE 48X4 2 PLY STRL (GAUZE/BANDAGES/DRESSINGS) ×1 IMPLANT
STOCKINETTE STRL 4IN 9604848 (GAUZE/BANDAGES/DRESSINGS) ×2 IMPLANT
SUT GORETEX CV-6TTC-13 36IN (SUTURE) ×4 IMPLANT
SUT MNCRL AB 4-0 PS2 18 (SUTURE) ×2 IMPLANT
SUT PROLENE 6 0 BV (SUTURE) ×8 IMPLANT
SUT SILK 0 SH 30 (SUTURE) ×2 IMPLANT
SUT SILK 2 0 (SUTURE) ×1
SUT SILK 2-0 18XBRD TIE 12 (SUTURE) ×1 IMPLANT
SUT SILK 3 0 (SUTURE) ×1
SUT SILK 3-0 18XBRD TIE 12 (SUTURE) ×1 IMPLANT
SUT SILK 4 0 (SUTURE) ×1
SUT SILK 4-0 18XBRD TIE 12 (SUTURE) ×1 IMPLANT
SUT VIC AB 3-0 SH 27 (SUTURE) ×1
SUT VIC AB 3-0 SH 27X BRD (SUTURE) ×1 IMPLANT
SYR 20CC LL (SYRINGE) ×2 IMPLANT
SYR 3ML LL SCALE MARK (SYRINGE) ×2 IMPLANT
SYR TB 1ML 27GX1/2 LL (SYRINGE) IMPLANT
TOWEL OR 17X26 4PK STRL BLUE (TOWEL DISPOSABLE) ×2 IMPLANT

## 2018-07-19 NOTE — Anesthesia Post-op Follow-up Note (Signed)
Anesthesia QCDR form completed.        

## 2018-07-19 NOTE — OR Nursing (Addendum)
Patient presented with elevated BP this am, Dr Randa Lynn aware and orders received.  Called OR for time to start antibiotic, OR nurse will call out when time to start. Patient currently has wound on right leg that is currently being treated with home health.  Patient afebrile today.

## 2018-07-19 NOTE — Anesthesia Preprocedure Evaluation (Signed)
Anesthesia Evaluation  Patient identified by MRN, date of birth, ID band Patient awake    Reviewed: Allergy & Precautions, NPO status , Patient's Chart, lab work & pertinent test results  History of Anesthesia Complications Negative for: history of anesthetic complications  Airway Mallampati: II  TM Distance: >3 FB Neck ROM: Full    Dental  (+) Upper Dentures, Lower Dentures   Pulmonary neg sleep apnea, neg COPD, former smoker,    breath sounds clear to auscultation- rhonchi (-) wheezing      Cardiovascular hypertension, Pt. on medications + Past MI, + Peripheral Vascular Disease and +CHF  (-) Cardiac Stents and (-) CABG  Rhythm:Regular Rate:Normal - Systolic murmurs and - Diastolic murmurs Echo 5/95/63: - Left ventricle: The cavity size was normal. Wall thickness was   increased in a pattern of severe LVH. Systolic function was   hyperdynamic. The estimated ejection fraction was in the range of   70% to 75%. Wall motion was normal; there were no regional wall   motion abnormalities. Doppler parameters are consistent with   abnormal left ventricular relaxation (grade 1 diastolic   dysfunction). Doppler parameters are consistent with high   ventricular filling pressure. - Mitral valve: Calcified annulus. - Right ventricle: The cavity size was normal. Systolic function   was normal. - Pericardium, extracardiac: A small pericardial effusion was   identified anterior to the heart.   Neuro/Psych neg Seizures PSYCHIATRIC DISORDERS Anxiety    GI/Hepatic negative GI ROS, Neg liver ROS,   Endo/Other  diabetes  Renal/GU ESRF and DialysisRenal disease     Musculoskeletal negative musculoskeletal ROS (+)   Abdominal (+) - obese,   Peds  Hematology  (+) anemia ,   Anesthesia Other Findings Past Medical History: No date: Anemia No date: Anxiety No date: CHF (congestive heart failure) ([facility]) No date: Chronic kidney  disease     Comment:  esrd dialysis m/w/f No date: Gout No date: Hyperlipidemia No date: Hypertension 2010: Myocardial infarction [facility])     Comment:  10 years ago 2020: Neuromuscular disorder ([place])     Comment:  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 No date: Peripheral vascular disease ([place])   Reproductive/Obstetrics                             Anesthesia Physical Anesthesia Plan  ASA: IV  Anesthesia Plan: General   Post-op Pain Management:    Induction: Intravenous  PONV Risk Score and Plan: 1 and Ondansetron  Airway Management Planned: LMA  Additional Equipment:   Intra-op Plan:   Post-operative Plan:   Informed Consent: I have reviewed the patients History and Physical, chart, labs and discussed the procedure including the risks, benefits and alternatives for the proposed anesthesia with the patient or authorized representative who has indicated his/her understanding and acceptance.     Dental advisory given  Plan Discussed with: CRNA and Anesthesiologist  Anesthesia Plan Comments:         Anesthesia Quick Evaluation

## 2018-07-19 NOTE — Op Note (Signed)
[facility]  OPERATIVE NOTE   PROCEDURE:  Right upper arm brachial artery to axillary vein arteriovenous graft  PRE-OPERATIVE DIAGNOSIS: 1. end stage renal disease  2. PAD 3. Chronic wound RLE  POST-OPERATIVE DIAGNOSIS: same  SURGEON: Johnny [name]  ASSISTANT(S): Hezzie Bump, PA-C  ANESTHESIA: general  ESTIMATED BLOOD LOSS: 15 cc  FINDING(S): 1. none  SPECIMEN(S):  None  INDICATIONS:   Johnny [name]. is a 68 y.o. male who presents with end stage renal disease and need for permanent access.  Risk, benefits, and alternatives to access surgery were discussed.  The patient is aware the risks include but are not limited to: bleeding, infection, steal syndrome, nerve damage, ischemic monomelic neuropathy, failure to mature, and need for additional procedures.  The patient is aware of the risks and elects to proceed forward. An assistant was present during the procedure to help facilitate the exposure and expedite the procedure.  DESCRIPTION: After full informed written consent was obtained from the patient, the patient was brought back to the operating room and placed supine upon the operating table.  The patient was given IV antibiotics prior to proceeding.  After obtaining adequate sedation, the patient was prepped and draped in standard fashion for a right arm access procedure.  I turned my attention first to the antecubitum.  The assistant provided retraction and mobilization to help facilitate exposure and expedite the procedure throughout the entire procedure.  This included following suture, using retractors, and optimizing lighting.  I made an incision over the brachial artery, and dissected down through the subcutaneous tissue to the fascia carefully and was able to dissect out the brachial artery.  The artery was about patent and of adequate size to support a graft.  It was controlled proximally and distally with vessel loops and then I turned my  attention to the high bicipital groove in the axilla.  I made an incision and dissected down through the subcutaneous tissue and fascia until I reached the axillary vein.  It was patent and adequate size for graft creation.  I then dissected this vein proximally and distal and prepared it for control with Bulldog clamps.  I took a Dietitian and dissected from the antecubital up to the axillary incision.  Then I delivered the 6 mm Artegraft, through this metal tunneler and then pulled out the metal tunneler leaving the graft in place and making sure the line was up for orientation.  I then gave the patient 3000 units of heparin to gain some anticoagulation.  After waiting 3 minutes, I placed the brachial artery under tension proximally and distally with vessel loops, made an arteriotomy and extended it with a Potts scissor.  I sewed the graft to this arteriotomy with a running stitch of 6-0 Prolene.  At this point, then I completed the anastomosis in the usual fashion.  I released the vessel loops on the inflow and allowed the artery to decompress through the graft. There was good pulsatile bleeding through this graft.  I clamped the graft near its arterial anastomosis and sucked out all the blood in the graft and loaded the graft with heparinized saline.  At this point, I pulled the graft to appropriate length and reset my exposure of the axillary vein.  Then, I controlled the vein with Bulldog clamps.  An anterior wall venotomy was created with an 11 blade and extended with Potts scissors.  I then spatulated the graft to facilitate an end-to- side anastomosis matching the  arteriotomy.  In the process of spatulating, I cut the graft to appropriate length for this anastomosis.  This graft was sewn to the vein in an end-to-side configuration with a running 6-0 Prolene.  Prior to completing this anastomosis, I allowed the vein to back bleed and then I also allowed the artery to bleed in an antegrade fashion.   I completed this anastomosis in the usual fashion, and then irrigated out the high bicipital exposure and then placed Surgicel and Evicel.  I then turned my attention back to the antecubitum.  There was pulsatile flow in the artery beyond the graft.   At this point, I washed out the antecubital incision. Surgicel and Evicel were then placed. There was no more active bleeding.  The subcutaneous tissue was reapproximated with a running stitch of 3-0 Vicryl.  The skin was then reapproximated with a running subcuticular 4-0 Monocryl.  The skin was then cleaned, dried, and Dermabond used to reinforce the skin closure.  We then turned our attention to the axillary incision.  The subcutaneous tissue was repaired with running stitch of 3-0 Vicryl.  The skin was then reapproximated with running subcuticular 4-0 Monocryl.  The skin was then cleaned, dried, and then the skin closure was reinforced with Dermabond.  The patient was then awakened from anesthesia and taken to the recovery room in stable condition having tolerated the procedure well.    COMPLICATIONS: None  CONDITION: Stable   Johnny [name] 07/19/2018 2:11 PM   This note was created with Dragon Medical transcription system. Any errors in dictation are purely unintentional.

## 2018-07-19 NOTE — Transfer of Care (Signed)
Immediate Anesthesia Transfer of Care Note  Patient: Johnny [name].  Procedure(s) Performed: INSERTION OF ARTERIOVENOUS (AV) GORE-TEX GRAFT ARM ( BRACHIAL AXILLARY) (Right )  Patient Location: PACU  Anesthesia Type:General  Level of Consciousness: sedated  Airway & Oxygen Therapy: Patient Spontanous Breathing and Patient connected to face mask oxygen  Post-op Assessment: Report given to RN and Post -op Vital signs reviewed and stable  Post vital signs: Reviewed and stable  Last Vitals:  Vitals Value Taken Time  BP    Temp    Pulse    Resp    SpO2      Last Pain:  Vitals:   07/19/18 1554  TempSrc: Oral  PainSc: 3          Complications: No apparent anesthesia complications

## 2018-07-19 NOTE — Anesthesia Procedure Notes (Signed)
Procedure Name: LMA Insertion Date/Time: 07/19/2018 12:34 PM Performed by: Justus Memory, CRNA Pre-anesthesia Checklist: Patient identified, Patient being monitored, Timeout performed, Emergency Drugs available and Suction available Patient Re-evaluated:Patient Re-evaluated prior to induction Oxygen Delivery Method: Circle system utilized Preoxygenation: Pre-oxygenation with 100% oxygen Induction Type: IV induction Ventilation: Mask ventilation without difficulty LMA: LMA inserted LMA Size: 3.5 Tube type: Oral Number of attempts: 1 Placement Confirmation: positive ETCO2 and breath sounds checked- equal and bilateral Tube secured with: Tape Dental Injury: Teeth and Oropharynx as per pre-operative assessment

## 2018-07-19 NOTE — H&P (Signed)
[facility] History & Physical Update  The patient was interviewed and re-examined.  The patient's previous History and Physical has been reviewed and is unchanged.  There is no change in the plan of care. We plan to proceed with the scheduled procedure.  Leotis Pain, MD  07/19/2018, 11:42 AM

## 2018-07-19 NOTE — Discharge Instructions (Signed)
AV Fistula Placement, Care After  This sheet gives you information about how to care for yourself after your procedure. Your health care provider may also give you more specific instructions. If you have problems or questions, contact your health care provider.  What can I expect after the procedure?  After the procedure, it is common to:  · Feel sore.  · Feel a vibration (thrill) over the fistula.  Follow these instructions at home:  Incision care         · Follow instructions from your health care provider about how to take care of your incision. Make sure you:  ? Wash your hands with soap and water before and after you change your bandage (dressing). If soap and water are not available, use hand sanitizer.  ? Change your dressing as told by your health care provider.  ? Leave stitches (sutures), skin glue, or adhesive strips in place. These skin closures may need to stay in place for 2 weeks or longer. If adhesive strip edges start to loosen and curl up, you may trim the loose edges. Do not remove adhesive strips completely unless your health care provider tells you to do that.  Fistula care  · Check your fistula site every day to make sure the thrill feels the same.  · Check your fistula site every day for signs of infection. Check for:  ? Redness, swelling, or pain.  ? Fluid or blood.  ? Warmth.  ? Pus or a bad smell.  · Raise (elevate) the affected area above the level of your heart while you are sitting or lying down.  · Do not lift anything that is heavier than 10 lb (4.5 kg), or the limit that you are told, until your health care provider says that it is safe.  · Do not lie down on your fistula arm.  · Do not let anyone draw blood or take a blood pressure reading on your fistula arm. This is important.  · Do not wear tight jewelry or clothing over your fistula arm.  Bathing  · Do not take baths, swim, or use a hot tub until your health care provider approves. Ask your health care provider if you may take  showers. You may only be allowed to take sponge baths.  · Keep the area around your incision clean and dry.  Medicines  · Take over-the-counter and prescription medicines only as told by your health care provider.  · Ask your health care provider if any medicine prescribed to you can cause constipation. You may need to take steps to prevent or treat constipation, such as:  ? Drink enough fluid to keep your urine pale yellow.  ? Take over-the-counter or prescription medicines.  ? Eat foods that are high in fiber, such as beans, whole grains, and fresh fruits and vegetables.  ? Limit foods that are high in fat and processed sugars, such as fried or sweet foods.  General instructions  · Rest at home for a day or two.  · Return to your normal activities as told by your health care provider. Ask your health care provider what activities are safe for you.  · Keep all follow-up visits as told by your health care provider. This is important.  Contact a health care provider if:  · You have redness, swelling, or pain around your fistula site.  · Your fistula site feels warm to the touch.  · You have pus or a bad smell coming from your   pain.  Have trouble breathing. Summary  Follow instructions from your health care provider about how to take care of your incision.  Do not let anyone draw blood or take a blood pressure reading on your fistula arm. This is important.  Return to your normal activities as told by your health care provider. Ask your health care provider what activities are safe for you.  Contact a health care provider if you have a change in the thrill or have any signs of infection at your fistula site.  Keep all follow-up visits as told by your health care provider. This is  important. This information is not intended to replace advice given to you by your health care provider. Make sure you discuss any questions you have with your health care provider. Document Released: 04/25/2005 Document Revised: 10/30/2017 Document Reviewed: 10/30/2017 Elsevier Interactive Patient Education  2019 [place]   1) The drugs that you were given will stay in your system until tomorrow so for the next 24 hours you should not:  A) Drive an automobile B) Make any legal decisions C) Drink any alcoholic beverage   2) You may resume regular meals tomorrow.  Today it is better to start with liquids and gradually work up to solid foods.  You may eat anything you prefer, but it is better to start with liquids, then soup and crackers, and gradually work up to solid foods.   3) Please notify your doctor immediately if you have any unusual bleeding, trouble breathing, redness and pain at the surgery site, drainage, fever, or pain not relieved by medication.    4) Additional Instructions:        Please contact your physician with any problems or Same Day Surgery at [phone], Monday through Friday 6 am to 4 pm, or [facility] number at [phone].

## 2018-07-21 ENCOUNTER — Emergency Department: Payer: Medicare Other

## 2018-07-21 ENCOUNTER — Other Ambulatory Visit: Payer: Self-pay

## 2018-07-21 ENCOUNTER — Inpatient Hospital Stay
Admission: EM | Admit: 2018-07-21 | Discharge: 2018-07-26 | DRG: 270 | Disposition: A | Discharge status: Skilled Nursing Facility | Payer: Medicare Other | Attending: Internal Medicine | Admitting: Internal Medicine

## 2018-07-21 DIAGNOSIS — X58XXXA Exposure to other specified factors, initial encounter: Secondary | ICD-10-CM | POA: Diagnosis not present

## 2018-07-21 DIAGNOSIS — M7989 Other specified soft tissue disorders: Secondary | ICD-10-CM | POA: Diagnosis not present

## 2018-07-21 DIAGNOSIS — N2581 Secondary hyperparathyroidism of renal origin: Secondary | ICD-10-CM | POA: Diagnosis present

## 2018-07-21 DIAGNOSIS — Z7902 Long term (current) use of antithrombotics/antiplatelets: Secondary | ICD-10-CM

## 2018-07-21 DIAGNOSIS — T86821 Skin graft (allograft) (autograft) failure: Secondary | ICD-10-CM | POA: Diagnosis present

## 2018-07-21 DIAGNOSIS — Z7982 Long term (current) use of aspirin: Secondary | ICD-10-CM

## 2018-07-21 DIAGNOSIS — Z91013 Allergy to seafood: Secondary | ICD-10-CM | POA: Diagnosis not present

## 2018-07-21 DIAGNOSIS — M109 Gout, unspecified: Secondary | ICD-10-CM | POA: Diagnosis present

## 2018-07-21 DIAGNOSIS — E875 Hyperkalemia: Secondary | ICD-10-CM | POA: Diagnosis present

## 2018-07-21 DIAGNOSIS — I739 Peripheral vascular disease, unspecified: Secondary | ICD-10-CM | POA: Diagnosis not present

## 2018-07-21 DIAGNOSIS — L089 Local infection of the skin and subcutaneous tissue, unspecified: Secondary | ICD-10-CM

## 2018-07-21 DIAGNOSIS — I70261 Atherosclerosis of native arteries of extremities with gangrene, right leg: Principal | ICD-10-CM | POA: Diagnosis present

## 2018-07-21 DIAGNOSIS — S81802A Unspecified open wound, left lower leg, initial encounter: Secondary | ICD-10-CM | POA: Diagnosis not present

## 2018-07-21 DIAGNOSIS — I252 Old myocardial infarction: Secondary | ICD-10-CM | POA: Diagnosis not present

## 2018-07-21 DIAGNOSIS — Z7901 Long term (current) use of anticoagulants: Secondary | ICD-10-CM | POA: Diagnosis not present

## 2018-07-21 DIAGNOSIS — D631 Anemia in chronic kidney disease: Secondary | ICD-10-CM | POA: Diagnosis present

## 2018-07-21 DIAGNOSIS — Z95820 Peripheral vascular angioplasty status with implants and grafts: Secondary | ICD-10-CM | POA: Diagnosis not present

## 2018-07-21 DIAGNOSIS — Z87891 Personal history of nicotine dependence: Secondary | ICD-10-CM | POA: Diagnosis not present

## 2018-07-21 DIAGNOSIS — N186 End stage renal disease: Secondary | ICD-10-CM | POA: Diagnosis present

## 2018-07-21 DIAGNOSIS — I998 Other disorder of circulatory system: Secondary | ICD-10-CM | POA: Diagnosis not present

## 2018-07-21 DIAGNOSIS — I5032 Chronic diastolic (congestive) heart failure: Secondary | ICD-10-CM | POA: Diagnosis present

## 2018-07-21 DIAGNOSIS — L03115 Cellulitis of right lower limb: Secondary | ICD-10-CM | POA: Diagnosis present

## 2018-07-21 DIAGNOSIS — I509 Heart failure, unspecified: Secondary | ICD-10-CM | POA: Diagnosis not present

## 2018-07-21 DIAGNOSIS — F419 Anxiety disorder, unspecified: Secondary | ICD-10-CM | POA: Diagnosis present

## 2018-07-21 DIAGNOSIS — Y832 Surgical operation with anastomosis, bypass or graft as the cause of abnormal reaction of the patient, or of later complication, without mention of misadventure at the time of the procedure: Secondary | ICD-10-CM | POA: Diagnosis present

## 2018-07-21 DIAGNOSIS — Z833 Family history of diabetes mellitus: Secondary | ICD-10-CM | POA: Diagnosis not present

## 2018-07-21 DIAGNOSIS — L97218 Non-pressure chronic ulcer of right calf with other specified severity: Secondary | ICD-10-CM | POA: Diagnosis present

## 2018-07-21 DIAGNOSIS — E785 Hyperlipidemia, unspecified: Secondary | ICD-10-CM | POA: Diagnosis present

## 2018-07-21 DIAGNOSIS — I132 Hypertensive heart and chronic kidney disease with heart failure and with stage 5 chronic kidney disease, or end stage renal disease: Secondary | ICD-10-CM | POA: Diagnosis present

## 2018-07-21 DIAGNOSIS — T148XXA Other injury of unspecified body region, initial encounter: Secondary | ICD-10-CM

## 2018-07-21 DIAGNOSIS — T82856A Stenosis of peripheral vascular stent, initial encounter: Secondary | ICD-10-CM | POA: Diagnosis not present

## 2018-07-21 DIAGNOSIS — I953 Hypotension of hemodialysis: Secondary | ICD-10-CM | POA: Diagnosis not present

## 2018-07-21 DIAGNOSIS — I743 Embolism and thrombosis of arteries of the lower extremities: Secondary | ICD-10-CM | POA: Diagnosis not present

## 2018-07-21 DIAGNOSIS — Z89422 Acquired absence of other left toe(s): Secondary | ICD-10-CM | POA: Diagnosis not present

## 2018-07-21 DIAGNOSIS — I96 Gangrene, not elsewhere classified: Secondary | ICD-10-CM | POA: Diagnosis present

## 2018-07-21 DIAGNOSIS — Z992 Dependence on renal dialysis: Secondary | ICD-10-CM

## 2018-07-21 LAB — COMPREHENSIVE METABOLIC PANEL
ALT: 7 U/L (ref 0–44)
AST: 11 U/L — AB (ref 15–41)
Albumin: 3.2 g/dL — ABNORMAL LOW (ref 3.5–5.0)
Alkaline Phosphatase: 70 U/L (ref 38–126)
Anion gap: 17 — ABNORMAL HIGH (ref 5–15)
BUN: 59 mg/dL — AB (ref 8–23)
CO2: 19 mmol/L — ABNORMAL LOW (ref 22–32)
Calcium: 10.1 mg/dL (ref 8.9–10.3)
Chloride: 103 mmol/L (ref 98–111)
Creatinine, Ser: 10.04 mg/dL — ABNORMAL HIGH (ref 0.61–1.24)
GFR calc Af Amer: 5 mL/min — ABNORMAL LOW (ref >60)
GFR calc non Af Amer: 5 mL/min — ABNORMAL LOW (ref >60)
Glucose, Bld: 88 mg/dL (ref 70–99)
Potassium: 5.2 mmol/L — ABNORMAL HIGH (ref 3.5–5.1)
Sodium: 139 mmol/L (ref 135–145)
Total Bilirubin: 1.1 mg/dL (ref 0.3–1.2)
Total Protein: 8 g/dL (ref 6.5–8.1)

## 2018-07-21 LAB — CBC WITH DIFFERENTIAL/PLATELET
ABS IMMATURE GRANULOCYTES: 0.11 10*3/uL — AB (ref 0.00–0.07)
BASOS PCT: 1 %
Basophils Absolute: 0.1 10*3/uL (ref 0.0–0.1)
Eosinophils Absolute: 0.1 10*3/uL (ref 0.0–0.5)
Eosinophils Relative: 1 %
HCT: 34.4 % — ABNORMAL LOW (ref 39.0–52.0)
HEMOGLOBIN: 10.8 g/dL — AB (ref 13.0–17.0)
Immature Granulocytes: 1 %
Lymphocytes Relative: 8 %
Lymphs Abs: 1.5 10*3/uL (ref 0.7–4.0)
MCH: 30 pg (ref 26.0–34.0)
MCHC: 31.4 g/dL (ref 30.0–36.0)
MCV: 95.6 fL (ref 80.0–100.0)
Monocytes Absolute: 1.7 10*3/uL — ABNORMAL HIGH (ref 0.1–1.0)
Monocytes Relative: 10 %
Neutro Abs: 14.4 10*3/uL — ABNORMAL HIGH (ref 1.7–7.7)
Neutrophils Relative %: 79 %
Platelets: 283 10*3/uL (ref 150–400)
RBC: 3.6 MIL/uL — ABNORMAL LOW (ref 4.22–5.81)
RDW: 13.5 % (ref 11.5–15.5)
WBC: 17.9 10*3/uL — ABNORMAL HIGH (ref 4.0–10.5)
nRBC: 0 % (ref 0.0–0.2)

## 2018-07-21 MED ORDER — FUROSEMIDE 40 MG PO TABS
40.0000 mg | ORAL_TABLET | Freq: Every day | ORAL | Status: DC
  Administered 2018-07-21 – 2018-07-22 (×2): 40 mg via ORAL
  Filled 2018-07-21 (×3): qty 1

## 2018-07-21 MED ORDER — ACETAMINOPHEN 650 MG RE SUPP: 650.0000 mg | Freq: Four times a day (QID) | RECTAL | Status: DC | PRN

## 2018-07-21 MED ORDER — DOCUSATE SODIUM 100 MG PO CAPS
100.0000 mg | ORAL_CAPSULE | Freq: Two times a day (BID) | ORAL | Status: DC
  Administered 2018-07-21 – 2018-07-26 (×9): 100 mg via ORAL
  Filled 2018-07-21 (×9): qty 1

## 2018-07-21 MED ORDER — COLCHICINE 0.6 MG PO TABS: 0.6000 mg | ORAL_TABLET | Freq: Every day | ORAL | Status: DC | PRN

## 2018-07-21 MED ORDER — VANCOMYCIN HCL IN DEXTROSE 1-5 GM/200ML-% IV SOLN
1000.0000 mg | Freq: Once | INTRAVENOUS | Status: DC
  Filled 2018-07-21: qty 200

## 2018-07-21 MED ORDER — BISACODYL 5 MG PO TBEC: 5.0000 mg | DELAYED_RELEASE_TABLET | Freq: Every day | ORAL | Status: DC | PRN

## 2018-07-21 MED ORDER — CLOPIDOGREL BISULFATE 75 MG PO TABS: 75.0000 mg | ORAL_TABLET | Freq: Every day | ORAL | Status: DC

## 2018-07-21 MED ORDER — ALLOPURINOL 100 MG PO TABS
100.0000 mg | ORAL_TABLET | Freq: Every day | ORAL | Status: DC
  Administered 2018-07-22 – 2018-07-25 (×2): 100 mg via ORAL
  Filled 2018-07-21 (×4): qty 1

## 2018-07-21 MED ORDER — PIPERACILLIN-TAZOBACTAM 3.375 G IVPB
3.3750 g | Freq: Two times a day (BID) | INTRAVENOUS | Status: DC
  Administered 2018-07-21 – 2018-07-25 (×7): 3.375 g via INTRAVENOUS
  Filled 2018-07-21 (×7): qty 50

## 2018-07-21 MED ORDER — CALCIUM ACETATE (PHOS BINDER) 667 MG PO CAPS
1334.0000 mg | ORAL_CAPSULE | Freq: Three times a day (TID) | ORAL | Status: DC
  Administered 2018-07-21 – 2018-07-26 (×10): 1334 mg via ORAL
  Filled 2018-07-21 (×11): qty 2

## 2018-07-21 MED ORDER — VANCOMYCIN HCL IN DEXTROSE 750-5 MG/150ML-% IV SOLN
750.0000 mg | INTRAVENOUS | Status: DC
  Administered 2018-07-23: 750 mg via INTRAVENOUS
  Filled 2018-07-21 (×2): qty 150

## 2018-07-21 MED ORDER — HYDRALAZINE HCL 25 MG PO TABS
25.0000 mg | ORAL_TABLET | Freq: Every day | ORAL | Status: DC
  Administered 2018-07-21 – 2018-07-23 (×3): 25 mg via ORAL
  Filled 2018-07-21 (×4): qty 1

## 2018-07-21 MED ORDER — ACETAMINOPHEN 325 MG PO TABS
650.0000 mg | ORAL_TABLET | Freq: Four times a day (QID) | ORAL | Status: DC | PRN
  Administered 2018-07-25: 650 mg via ORAL
  Filled 2018-07-21: qty 2

## 2018-07-21 MED ORDER — VANCOMYCIN HCL 10 G IV SOLR
1250.0000 mg | Freq: Once | INTRAVENOUS | Status: AC
  Administered 2018-07-21: 1250 mg via INTRAVENOUS
  Filled 2018-07-21: qty 1250

## 2018-07-21 MED ORDER — PIPERACILLIN-TAZOBACTAM 3.375 G IVPB 30 MIN
3.3750 g | Freq: Once | INTRAVENOUS | Status: AC
  Administered 2018-07-21: 3.375 g via INTRAVENOUS
  Filled 2018-07-21: qty 50

## 2018-07-21 MED ORDER — GABAPENTIN 300 MG PO CAPS
300.0000 mg | ORAL_CAPSULE | Freq: Three times a day (TID) | ORAL | Status: DC
  Administered 2018-07-21 – 2018-07-22 (×3): 300 mg via ORAL
  Filled 2018-07-21 (×4): qty 1

## 2018-07-21 MED ORDER — ONDANSETRON HCL 4 MG/2ML IJ SOLN
4.0000 mg | Freq: Four times a day (QID) | INTRAMUSCULAR | Status: DC | PRN
  Administered 2018-07-22: 4 mg via INTRAVENOUS
  Filled 2018-07-21 (×2): qty 2

## 2018-07-21 MED ORDER — SODIUM CHLORIDE 0.9 % IV SOLN
2.0000 g | Freq: Once | INTRAVENOUS | Status: DC
  Administered 2018-07-21: 2 g via INTRAVENOUS
  Filled 2018-07-21: qty 20

## 2018-07-21 MED ORDER — HEPARIN SODIUM (PORCINE) 5000 UNIT/ML IJ SOLN: 5000.0000 [IU] | Freq: Three times a day (TID) | INTRAMUSCULAR | Status: DC

## 2018-07-21 MED ORDER — ATORVASTATIN CALCIUM 10 MG PO TABS
10.0000 mg | ORAL_TABLET | Freq: Every day | ORAL | Status: DC
  Administered 2018-07-21 – 2018-07-26 (×5): 10 mg via ORAL
  Filled 2018-07-21 (×5): qty 1

## 2018-07-21 MED ORDER — HYDROCODONE-ACETAMINOPHEN 5-325 MG PO TABS
1.0000 | ORAL_TABLET | ORAL | Status: DC | PRN
  Administered 2018-07-21 – 2018-07-26 (×9): 1 via ORAL
  Filled 2018-07-21 (×10): qty 1

## 2018-07-21 MED ORDER — ONDANSETRON HCL 4 MG PO TABS: 4.0000 mg | ORAL_TABLET | Freq: Four times a day (QID) | ORAL | Status: DC | PRN

## 2018-07-21 MED ORDER — CALCITRIOL 0.25 MCG PO CAPS
0.5000 ug | ORAL_CAPSULE | Freq: Every day | ORAL | Status: DC
  Administered 2018-07-22 – 2018-07-26 (×4): 0.5 ug via ORAL
  Filled 2018-07-21 (×6): qty 2

## 2018-07-21 MED ORDER — APIXABAN 2.5 MG PO TABS
2.5000 mg | ORAL_TABLET | Freq: Two times a day (BID) | ORAL | Status: DC
  Administered 2018-07-21 – 2018-07-22 (×4): 2.5 mg via ORAL
  Filled 2018-07-21 (×5): qty 1

## 2018-07-21 MED ORDER — METOPROLOL SUCCINATE ER 25 MG PO TB24
25.0000 mg | ORAL_TABLET | Freq: Every day | ORAL | Status: DC
  Administered 2018-07-21 – 2018-07-24 (×4): 25 mg via ORAL
  Filled 2018-07-21 (×4): qty 1

## 2018-07-21 MED ORDER — CARVEDILOL 6.25 MG PO TABS
6.2500 mg | ORAL_TABLET | Freq: Every evening | ORAL | Status: DC
  Administered 2018-07-21: 6.25 mg via ORAL
  Filled 2018-07-21 (×3): qty 1

## 2018-07-21 MED ORDER — AMLODIPINE BESYLATE 10 MG PO TABS
10.0000 mg | ORAL_TABLET | Freq: Every day | ORAL | Status: DC
  Administered 2018-07-21 – 2018-07-22 (×2): 10 mg via ORAL
  Filled 2018-07-21: qty 2
  Filled 2018-07-21 (×3): qty 1
  Filled 2018-07-21: qty 2

## 2018-07-21 MED ORDER — SEVELAMER CARBONATE 800 MG PO TABS
2400.0000 mg | ORAL_TABLET | Freq: Three times a day (TID) | ORAL | Status: DC
  Administered 2018-07-21 – 2018-07-26 (×10): 2400 mg via ORAL
  Filled 2018-07-21 (×11): qty 3

## 2018-07-21 MED ORDER — IRBESARTAN 150 MG PO TABS
150.0000 mg | ORAL_TABLET | Freq: Two times a day (BID) | ORAL | Status: DC
  Administered 2018-07-21 – 2018-07-22 (×3): 150 mg via ORAL
  Filled 2018-07-21 (×3): qty 1

## 2018-07-21 MED ORDER — PREGABALIN 50 MG PO CAPS
50.0000 mg | ORAL_CAPSULE | Freq: Every day | ORAL | Status: DC
  Administered 2018-07-22 – 2018-07-26 (×5): 50 mg via ORAL
  Filled 2018-07-21 (×5): qty 1

## 2018-07-21 MED ORDER — FENTANYL CITRATE (PF) 100 MCG/2ML IJ SOLN
100.0000 ug | INTRAMUSCULAR | Status: DC | PRN
  Administered 2018-07-21: 100 ug via INTRAVENOUS
  Filled 2018-07-21: qty 2

## 2018-07-21 MED ORDER — TRAZODONE HCL 50 MG PO TABS
25.0000 mg | ORAL_TABLET | Freq: Every evening | ORAL | Status: DC | PRN
  Administered 2018-07-21 – 2018-07-22 (×2): 25 mg via ORAL
  Filled 2018-07-21 (×2): qty 1

## 2018-07-21 NOTE — ED Notes (Signed)
Pt had a new dialysis port placed a few days ago in right arm. Pt states that left arm fistula has not been accessed or used in any way for "years" and that is where they always get blood from. Pt BP taken in left lower leg.

## 2018-07-21 NOTE — ED Notes (Signed)
Admitting at bedside 

## 2018-07-21 NOTE — H&P (Signed)
[name] NAME: Johnny [name]    MR#:  [id number]  DATE OF BIRTH:  06/24/1950  DATE OF ADMISSION:  07/21/2018  PRIMARY CARE PHYSICIAN: [name], MD   REQUESTING/REFERRING PHYSICIAN: Dr. Merlyn Lot  CHIEF COMPLAINT: Wound infection   Chief Complaint  Patient presents with  . Wound Infection    HISTORY OF PRESENT ILLNESS:  Johnny [name]  is a 68 y.o. male with a known history of ESRD on hemodialysis Monday, Wednesday, Friday comes in because of worsening right leg cellulitis.  Patient gets wound care with home health nurse 3 times a week, home health nurse know he was seen on Thursday felt wound was not infected but when she came today there and she noticed that he has foul-smelling drainage and she recommended that he go to ER.  Patient denies any fever.  Otherwise denies any complaints.  Recently was seen in the emergency room for anxiety, shaking after dialysis and was started on Klonopin.  PAST MEDICAL HISTORY:   Past Medical History:  Diagnosis Date  . Anemia   . Anxiety   . CHF (congestive heart failure) ([place])   . Chronic kidney disease    esrd dialysis m/w/f  . Gout   . Hyperlipidemia   . Hypertension   . Myocardial infarction ([place]) 2010   10 years ago  . 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  . Peripheral vascular disease ([place])     PAST SURGICAL HISTOIRY:   Past Surgical History:  Procedure Laterality Date  . A/V SHUNTOGRAM Left 06/21/2017   Procedure: A/V SHUNTOGRAM;  Surgeon: Katha Cabal, MD;  Location: [place] [facility];  Service: Cardiovascular;  Laterality: Left;  . APPLICATION OF WOUND VAC Right 04/11/2018   Procedure: APPLICATION OF WOUND VAC;  Surgeon: Algernon Huxley, MD;  Location: [facility];  Service: Vascular;  Laterality: Right;  . AV FISTULA PLACEMENT Left 09/18/2015   Procedure: INSERTION OF ARTERIOVENOUS (AV) GORE-TEX GRAFT ARM (  BRACH/AXILLARY GRAFT W/ INSTANT STICK GRAFT );  Surgeon: Katha Cabal, MD;  Location: [facility];  Service: Vascular;  Laterality: Left;  . DIALYSIS FISTULA CREATION Right 10/2017   right chest perm cath  . ESOPHAGOGASTRODUODENOSCOPY N/A 12/19/2017   Procedure: ESOPHAGOGASTRODUODENOSCOPY (EGD);  Surgeon: Lin Landsman, MD;  Location: [facility];  Service: Gastroenterology;  Laterality: N/A;  . LOWER EXTREMITY ANGIOGRAPHY Left 11/16/2017   Procedure: LOWER EXTREMITY ANGIOGRAPHY;  Surgeon: Algernon Huxley, MD;  Location: [place] [facility];  Service: Cardiovascular;  Laterality: Left;  . LOWER EXTREMITY ANGIOGRAPHY Right 01/18/2018   Procedure: LOWER EXTREMITY ANGIOGRAPHY;  Surgeon: Algernon Huxley, MD;  Location: [place] [facility];  Service: Cardiovascular;  Laterality: Right;  . LOWER EXTREMITY ANGIOGRAPHY Left 04/02/2018   Procedure: LOWER EXTREMITY ANGIOGRAPHY;  Surgeon: Algernon Huxley, MD;  Location: [place] [facility];  Service: Cardiovascular;  Laterality: Left;  . LOWER EXTREMITY ANGIOGRAPHY Right 04/09/2018   Procedure: Lower Extremity Angiography with possible intervention;  Surgeon: Algernon Huxley, MD;  Location: [place] [facility];  Service: Cardiovascular;  Laterality: Right;  . PERIPHERAL VASCULAR CATHETERIZATION Left 09/01/2015   Procedure: A/V Shuntogram/Fistulagram;  Surgeon: Katha Cabal, MD;  Location: [place] [facility];  Service: Cardiovascular;  Laterality: Left;  . PERIPHERAL VASCULAR CATHETERIZATION N/A 09/30/2015   Procedure: A/V Shuntogram/Fistulagram with perm cathether removal;  Surgeon: Algernon Huxley, MD;  Location: [place] [facility];  Service: Cardiovascular;  Laterality: N/A;  . PERIPHERAL VASCULAR CATHETERIZATION Left 09/30/2015   Procedure: A/V Shunt Intervention;  Surgeon: Algernon Huxley, MD;  Location: [place] [facility];  Service: Cardiovascular;  Laterality: Left;  . PERIPHERAL VASCULAR CATHETERIZATION Left 12/03/2015   Procedure: Thrombectomy;   Surgeon: Algernon Huxley, MD;  Location: [place] [facility];  Service: Cardiovascular;  Laterality: Left;  . PERIPHERAL VASCULAR CATHETERIZATION Left 01/28/2016   Procedure: Thrombectomy;  Surgeon: Algernon Huxley, MD;  Location: [place];  Service: Cardiovascular;  Laterality: Left;  . PERIPHERAL VASCULAR CATHETERIZATION N/A 01/28/2016   Procedure: A/V Shuntogram/Fistulagram;  Surgeon: Algernon Huxley, MD;  Location: [place] [facility];  Service: Cardiovascular;  Laterality: N/A;  . SKIN SPLIT GRAFT Right 05/24/2018   Procedure: SKIN GRAFT SPLIT THICKNESS ( RIGHT CALF);  Surgeon: Algernon Huxley, MD;  Location: [facility];  Service: Vascular;  Laterality: Right;  . WOUND DEBRIDEMENT Right 04/11/2018   Procedure: DEBRIDEMENT WOUND calf muscle and skin;  Surgeon: Algernon Huxley, MD;  Location: [facility];  Service: Vascular;  Laterality: Right;    SOCIAL HISTORY:   Social History   Tobacco Use  . Smoking status: Former Smoker    Types: Cigarettes    Last attempt to quit: 05/17/2005    Years since quitting: 13.1  . Smokeless tobacco: Never Used  Substance Use Topics  . Alcohol use: No    FAMILY HISTORY:   Family History  Problem Relation Age of Onset  . Hypertension Other   . Heart disease Other   . Diabetes Mother     DRUG ALLERGIES:   Allergies  Allergen Reactions  . Shellfish Allergy Anaphylaxis    REVIEW OF SYSTEMS:  CONSTITUTIONAL: No fever, fatigue or weakness.  EYES: No blurred or double vision.  EARS, NOSE, AND THROAT: No tinnitus or ear pain.  RESPIRATORY: No cough, shortness of breath, wheezing or hemoptysis.  CARDIOVASCULAR: No chest pain, orthopnea, edema.  GASTROINTESTINAL: No nausea, vomiting, diarrhea or abdominal pain.  GENITOURINARY: No dysuria, hematuria.  ENDOCRINE: No polyuria, nocturia,  HEMATOLOGY: No anemia, easy bruising or bleeding SKIN: No rash or lesion. MUSCULOSKELETAL: Right leg cellulitis.   NEUROLOGIC: No tingling, numbness, weakness.   PSYCHIATRY: No anxiety or depression.   MEDICATIONS AT HOME:   Prior to Admission medications   Medication Sig Start Date End Date Taking? Authorizing Provider  allopurinol (ZYLOPRIM) 100 MG tablet Take 100 mg by mouth daily.    [provider]  amLODipine (NORVASC) 10 MG tablet Take 10 mg by mouth daily.    [provider]  apixaban (ELIQUIS) 2.5 MG TABS tablet Take 2.5 mg by mouth 2 (two) times daily. Per pt. This has stopped for a while 03/28/18 06/08/18  [provider]  aspirin EC 81 MG tablet Take 1 tablet (81 mg total) by mouth daily. Patient not taking: Reported on 07/17/2018 04/02/18   Algernon Huxley, MD  atorvastatin (LIPITOR) 10 MG tablet Take 1 tablet by mouth daily. 11/23/17   [provider]  calcitRIOL (ROCALTROL) 0.5 MCG capsule Take 0.5 mcg by mouth daily.    [provider]  calcium acetate (PHOSLO) 667 MG capsule Take 1,334 mg by mouth 3 (three) times daily with meals.    [provider]  carvedilol (COREG) 6.25 MG tablet Take 6.25 mg by mouth every evening.    [provider]  clonazePAM (KLONOPIN) 0.5 MG tablet Take 1 tablet (0.5 mg total) by mouth as needed. 04/13/18  Henreitta Leber, MD  clopidogrel (PLAVIX) 75 MG tablet Take 1 tablet (75 mg total) by mouth daily. Patient taking differently: Take 75 mg by mouth daily. Per patient, this was stopped d/t blood thinning making it difficult to come off of dialysis 04/02/18   Algernon Huxley, MD  colchicine 0.6 MG tablet Take 0.6 mg by mouth daily as needed (for gout flares).     [provider]  folic acid-vitamin b complex-vitamin c-selenium-zinc (DIALYVITE) 3 MG TABS tablet Take by mouth.    [provider]  furosemide (LASIX) 40 MG tablet Take 40 mg by mouth daily.     [provider]  gabapentin (NEURONTIN) 100 MG capsule Take 300 mg by mouth 3 (three) times daily. 03/28/18   [provider]  hydrALAZINE (APRESOLINE) 25 MG tablet  Take 25 mg by mouth daily.    [provider]  HYDROcodone-acetaminophen (NORCO/VICODIN) 5-325 MG tablet Take 1 tablet by mouth every 4 (four) hours as needed for moderate pain. 07/19/18   Algernon Huxley, MD  irbesartan (AVAPRO) 150 MG tablet Take 150 mg by mouth 2 (two) times daily.    [provider]  metoprolol succinate (TOPROL-XL) 25 MG 24 hr tablet Take 25 mg by mouth at bedtime. 03/02/18   [provider]  pregabalin (LYRICA) 50 MG capsule Take 50 mg by mouth daily.    [provider]  sevelamer carbonate (RENVELA) 800 MG tablet Take 2,400 mg by mouth 3 (three) times daily with meals.    [provider]      VITAL SIGNS:  Blood pressure (!) 153/82, pulse (!) 111, temperature 98.4 F (36.9 C), temperature source Oral, resp. rate 18, height 5\' 8"  (1.727 m), weight 66 kg, SpO2 97 %.  PHYSICAL EXAMINATION:  GENERAL:  68 y.o.-year-old patient lying in the bed with no acute distress.  EYES: Pupils equal, round, reactive to light . No scleral icterus. Extraocular muscles intact.  HEENT: Head atraumatic, normocephalic. Oropharynx and nasopharynx clear.  NECK:  Supple, no jugular venous distention. No thyroid enlargement, no tenderness.  LUNGS: Normal breath sounds bilaterally, no wheezing, rales,rhonchi or crepitation. No use of accessory muscles of respiration.  CARDIOVASCULAR: S1, S2 normal. No murmurs, rubs, or gallops.  ABDOMEN: Soft, nontender, nondistended. Bowel sounds present. No organomegaly or mass.  EXTREMITIES: Foul-smelling right leg posterior wound with ulcer.  No associated swelling around the area. NEUROLOGIC: Cranial nerves II through XII are intact. Muscle strength 5/5 in all extremities. Sensation intact. Gait not checked.  PSYCHIATRIC: The patient is alert and oriented x 3.  SKIN: No obvious rash, lesion, or ulcer.   LABORATORY PANEL:   CBC Recent Labs  Lab 07/21/18 1343  WBC 17.9*  HGB 10.8*  HCT 34.4*  PLT 283    ------------------------------------------------------------------------------------------------------------------  Chemistries  Recent Labs  Lab 07/21/18 1343  NA 139  K 5.2*  CL 103  CO2 19*  GLUCOSE 88  BUN 59*  CREATININE 10.04*  CALCIUM 10.1  AST 11*  ALT 7  ALKPHOS 70  BILITOT 1.1   ------------------------------------------------------------------------------------------------------------------  Cardiac Enzymes No results for input(s): TROPONINI in the last 168 hours. ------------------------------------------------------------------------------------------------------------------  RADIOLOGY:  Dg Tibia/fibula Right  Result Date: 07/21/2018 CLINICAL DATA:  Patient with wound on the posterior right lower leg. History of peripheral vascular disease. EXAM: RIGHT TIBIA AND FIBULA - 2 VIEW COMPARISON:  Right lower extremity radiograph 05/30/2018 FINDINGS: Multiple skin staples are present. No radiopaque foreign body, soft tissue emphysema or osseous destruction. Degenerative changes  of the knee. Vascular calcifications. Stent graft material visualized within the distal thigh. IMPRESSION: No radiographic evidence to suggest osteomyelitis. Electronically Signed   By: Lovey Newcomer M.D.   On: 07/21/2018 14:13   Dg Chest Portable 1 View  Result Date: 07/21/2018 CLINICAL DATA:  End-stage renal disease.  Hypertension. EXAM: PORTABLE CHEST 1 VIEW COMPARISON:  May 30, 2018 FINDINGS: Central catheter tips in superior vena cava. No pneumothorax. There is no evident edema or consolidation. Heart size and pulmonary vascularity are normal. No adenopathy. There is aortic atherosclerosis. There are stents in the left axillary brachial regions. No bone lesions. IMPRESSION: Central catheter as described without pneumothorax. No edema or consolidation. Stable cardiac silhouette. Aortic Atherosclerosis (ICD10-I70.0). Electronically Signed   By: Lowella Grip III M.D.   On: 07/21/2018 14:11     EKG:   Orders placed or performed during the hospital encounter of 07/17/18  . EKG test  . EKG test    IMPRESSION AND PLAN:   68 year old male patient with ESRD on hemodialysis, chronic right leg wound, gets home health nurse for dressing changes 3 times a week, peripheral artery disease sent in by home health nurse because of worsening right leg infection. #1. right leg cellulitis.  Admitted to [facility] bed, continue vancomycin, Zosyn, consult wound care, vascular surgery, recent he was given Augmentin in January for right leg infection.. 2.  ESRD on hemodialysis Monday, Wednesday, Friday, epic text message Dr. Murlean Iba. 3.  Anxiety, patient clear was seen in the emergency room on January 22 .  Continue Klonopin   All the records are reviewed and case discussed with ED provider. Management plans discussed with the patient, family and they are in agreement.  CODE STATUS: Full code  TOTAL TIME TAKING CARE OF THIS PATIENT: 74minutes.    Epifanio Lesches M.D on 07/21/2018 at 3:44 PM  Between 7am to 6pm - Pager - [phone]  After 6pm go to [email or web address] - password [email or web address] [facility]  Office  [phone]  CC: Primary care physician; [name], MD  Note: This dictation was prepared with Dragon dictation along with smaller phrase technology. Any transcriptional errors that result from this process are unintentional.

## 2018-07-21 NOTE — ED Notes (Signed)
Per pharmacy, this nurse to stop rocephin (pt received about half) and start zosyn.

## 2018-07-21 NOTE — ED Triage Notes (Signed)
Pt comes from home after home health nurse calls for wound infection. Pt had a skin graft on right lower leg a couple weeks ago to cover a wound. Home health RN says wound was fine on Tuesday but now is red and swollen and pain 10/10. Pt was tachy with EMS and temp was 98.4. CBG 89.

## 2018-07-21 NOTE — Consult Note (Signed)
Pharmacy Antibiotic Note  Johnny [name]. is a 68 y.o. male admitted on 07/21/2018 with cellulitis.  Pharmacy has been consulted for vancomycin and Zosyn dosing. He had a new dialysis port placed a few days ago in right arm by Dr Delana Meyer. He had a skin graft on right lower leg a couple weeks ago to cover a wound which now appears to be infected  Plan: --Vancomycin 750 mg IV every MWF with HD following a 1250 mg loading dose    --Goal pre-HD level 15-25 mcg/mL: level to be drawn prior to 3rd HD session  --Zosyn 3.375g IV q12h  Height: 5\' 8"  (172.7 cm) Weight: 145 lb 8.1 oz (66 kg) IBW/kg (Calculated) : 68.4  Temp (24hrs), Avg:98.4 F (36.9 C), Min:98.4 F (36.9 C), Max:98.4 F (36.9 C)  Recent Labs  Lab 07/17/18 1412 07/21/18 1343  WBC 12.2* 17.9*  CREATININE 7.11* 10.04*    Estimated Creatinine Clearance: 6.5 mL/min (A) (by C-G formula based on SCr of 10.04 mg/dL (H)).    Antimicrobials this admission: vancomycin 3/14 >>  ceftriaxone 3/14 x 1  Zosyn 3/14 >>  Microbiology results: 3/14 WCx: pending  Thank you for allowing pharmacy to be a part of this patient's care.  Paulina Fusi, PharmD, BCPS 07/21/2018 4:27 PM

## 2018-07-21 NOTE — Plan of Care (Signed)
  Problem: Urinary Elimination: Goal: Signs and symptoms of infection will decrease Outcome: Progressing   

## 2018-07-21 NOTE — ED Notes (Signed)
ED TO INPATIENT HANDOFF REPORT  ED Nurse Name and Phone #: Janett Billow [phone]  A Name/Age/Gender Johnny [name]. 68 y.o. male Room/Bed: ED19A/ED19A  Code Status   Code Status: Full Code  Home/SNF/Other Home Patient oriented to: self, place, time and situation Is this baseline? Yes   Triage Complete: Triage complete  Chief Complaint Wound Infection  Triage Note Pt comes from home after home health nurse calls for wound infection. Pt had a skin graft on right lower leg a couple weeks ago to cover a wound. Home health RN says wound was fine on Tuesday but now is red and swollen and pain 10/10. Pt was tachy with EMS and temp was 98.4. CBG 89.   Allergies Allergies  Allergen Reactions  . Shellfish Allergy Anaphylaxis    Level of Care/Admitting Diagnosis ED Disposition    ED Disposition Condition [facility] Area: [place] [[id number]]  Level of Care: Med-Surg [16]  Diagnosis: Cellulitis of right leg [[id number]]  Admitting Physician: Epifanio Lesches [[id number]]  Attending Physician: Epifanio Lesches [[id number]]  Estimated length of stay: past midnight tomorrow  Certification:: I certify this patient will need inpatient services for at least 2 midnights  PT Class (Do Not Modify): Inpatient [101]  PT Acc Code (Do Not Modify): Private [1]       B Medical/Surgery History Past Medical History:  Diagnosis Date  . Anemia   . Anxiety   . CHF (congestive heart failure) ([place])   . Chronic kidney disease    esrd dialysis m/w/f  . Gout   . Hyperlipidemia   . Hypertension   . Myocardial infarction ([place]) 2010   10 years ago  . 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  . Peripheral vascular disease [facility])    Past Surgical History:  Procedure Laterality Date  . A/V SHUNTOGRAM Left 06/21/2017   Procedure: A/V SHUNTOGRAM;  Surgeon: Katha Cabal, MD;  Location: [place] [facility];  Service: Cardiovascular;   Laterality: Left;  . APPLICATION OF WOUND VAC Right 04/11/2018   Procedure: APPLICATION OF WOUND VAC;  Surgeon: Algernon Huxley, MD;  Location: [facility] OR[facility];  Service: Vascular;  Laterality: Right;  . AV FISTULA PLACEMENT Left 09/18/2015   Procedure: INSERTION OF ARTERIOVENOUS (AV) GORE-TEX GRAFT ARM ( BRACH/AXILLARY GRAFT W/ INSTANT STICK GRAFT );  Surgeon: Katha Cabal, MD;  Location: [facility];  Service: Vascular;  Laterality: Left;  . DIALYSIS FISTULA CREATION Right 10/2017   right chest perm cath  . ESOPHAGOGASTRODUODENOSCOPY N/A 12/19/2017   Procedure: ESOPHAGOGASTRODUODENOSCOPY (EGD);  Surgeon: Lin Landsman, MD;  Location: [facility] ENDOS[facility];  Service: Gastroenterology;  Laterality: N/A;  . LOWER EXTREMITY ANGIOGRAPHY Left 11/16/2017   Procedure: LOWER EXTREMITY ANGIOGRAPHY;  Surgeon: Algernon Huxley, MD;  Location: [place] [facility];  Service: Cardiovascular;  Laterality: Left;  . LOWER EXTREMITY ANGIOGRAPHY Right 01/18/2018   Procedure: LOWER EXTREMITY ANGIOGRAPHY;  Surgeon: Algernon Huxley, MD;  Location: [place] [facility];  Service: Cardiovascular;  Laterality: Right;  . LOWER EXTREMITY ANGIOGRAPHY Left 04/02/2018   Procedure: LOWER EXTREMITY ANGIOGRAPHY;  Surgeon: Algernon Huxley, MD;  Location: [place] [facility];  Service: Cardiovascular;  Laterality: Left;  . LOWER EXTREMITY ANGIOGRAPHY Right 04/09/2018   Procedure: Lower Extremity Angiography with possible intervention;  Surgeon: Algernon Huxley, MD;  Location: [place] [facility];  Service: Cardiovascular;  Laterality: Right;  . PERIPHERAL VASCULAR CATHETERIZATION Left 09/01/2015   Procedure: A/V Shuntogram/Fistulagram;  Surgeon: Katha Cabal, MD;  Location: [place] [facility];  Service: Cardiovascular;  Laterality: Left;  . PERIPHERAL VASCULAR CATHETERIZATION N/A 09/30/2015   Procedure: A/V Shuntogram/Fistulagram with perm cathether removal;  Surgeon: Algernon Huxley, MD;  Location: [place] [facility];  Service: Cardiovascular;   Laterality: N/A;  . PERIPHERAL VASCULAR CATHETERIZATION Left 09/30/2015   Procedure: A/V Shunt Intervention;  Surgeon: Algernon Huxley, MD;  Location: [place] [facility];  Service: Cardiovascular;  Laterality: Left;  . PERIPHERAL VASCULAR CATHETERIZATION Left 12/03/2015   Procedure: Thrombectomy;  Surgeon: Algernon Huxley, MD;  Location: [place] [facility];  Service: Cardiovascular;  Laterality: Left;  . PERIPHERAL VASCULAR CATHETERIZATION Left 01/28/2016   Procedure: Thrombectomy;  Surgeon: Algernon Huxley, MD;  Location: [place] [facility];  Service: Cardiovascular;  Laterality: Left;  . PERIPHERAL VASCULAR CATHETERIZATION N/A 01/28/2016   Procedure: A/V Shuntogram/Fistulagram;  Surgeon: Algernon Huxley, MD;  Location: [place] [facility];  Service: Cardiovascular;  Laterality: N/A;  . SKIN SPLIT GRAFT Right 05/24/2018   Procedure: SKIN GRAFT SPLIT THICKNESS ( RIGHT CALF);  Surgeon: Algernon Huxley, MD;  Location: [facility];  Service: Vascular;  Laterality: Right;  . WOUND DEBRIDEMENT Right 04/11/2018   Procedure: DEBRIDEMENT WOUND calf muscle and skin;  Surgeon: Algernon Huxley, MD;  Location: [facility];  Service: Vascular;  Laterality: Right;     A IV Location/Drains/Wounds Patient Lines/Drains/Airways Status   Active Line/Drains/Airways    Name:   Placement date:   Placement time:   Site:   Days:   Peripheral IV 07/21/18 Left Hand   07/21/18    1345    Hand   less than 1   Fistula / Graft Left Arteriovenous fistula   -    -    -      Fistula / Graft Left Upper arm   -    -    Upper arm      Fistula / Graft Right Other (Comment) Arteriovenous vein graft   07/19/18    1324    Other (Comment)   2   Hemodialysis Catheter Right Subclavian Double-lumen   -    -    Subclavian      Incision (Closed) 04/11/18 Leg Right   04/11/18    1410     101   Incision (Closed) 05/24/18 Leg Right   05/24/18    1316     58   Incision (Closed) 07/19/18 Arm Right   07/19/18    1303     2   Pressure Injury 04/05/18 Stage II -   Partial thickness loss of dermis presenting as a shallow open ulcer with a red, pink wound bed without slough. pink with dead skin surrounding injury   04/05/18    2202     107          Intake/Output Last 24 hours  Intake/Output Summary (Last 24 hours) at 07/21/2018 1536 Last data filed at 07/21/2018 1518 Gross per 24 hour  Intake 50 ml  Output -  Net 50 ml    Labs/Imaging Results for orders placed or performed during the hospital encounter of 07/21/18 (from the past 48 hour(s))  CBC with Differential/Platelet     Status: Abnormal   Collection Time: 07/21/18  1:43 PM  Result Value Ref Range   WBC 17.9 (H) 4.0 - 10.5 K/uL   RBC 3.60 (L) 4.22 - 5.81 MIL/uL   Hemoglobin 10.8 (L) 13.0 - 17.0 g/dL  HCT 34.4 (L) 39.0 - 52.0 %   MCV 95.6 80.0 - 100.0 fL   MCH 30.0 26.0 - 34.0 pg   MCHC 31.4 30.0 - 36.0 g/dL   RDW 13.5 11.5 - 15.5 %   Platelets 283 150 - 400 K/uL   nRBC 0.0 0.0 - 0.2 %   Neutrophils Relative % 79 %   Neutro Abs 14.4 (H) 1.7 - 7.7 K/uL   Lymphocytes Relative 8 %   Lymphs Abs 1.5 0.7 - 4.0 K/uL   Monocytes Relative 10 %   Monocytes Absolute 1.7 (H) 0.1 - 1.0 K/uL   Eosinophils Relative 1 %   Eosinophils Absolute 0.1 0.0 - 0.5 K/uL   Basophils Relative 1 %   Basophils Absolute 0.1 0.0 - 0.1 K/uL   Immature Granulocytes 1 %   Abs Immature Granulocytes 0.11 (H) 0.00 - 0.07 K/uL    Comment: Performed at [facility], [address] [place], [place] [address]  Comprehensive metabolic panel     Status: Abnormal   Collection Time: 07/21/18  1:43 PM  Result Value Ref Range   Sodium 139 135 - 145 mmol/L   Potassium 5.2 (H) 3.5 - 5.1 mmol/L    Comment: HEMOLYSIS AT THIS LEVEL MAY AFFECT RESULT   Chloride 103 98 - 111 mmol/L   CO2 19 (L) 22 - 32 mmol/L   Glucose, Bld 88 70 - 99 mg/dL   BUN 59 (H) 8 - 23 mg/dL   Creatinine, Ser 10.04 (H) 0.61 - 1.24 mg/dL   Calcium 10.1 8.9 - 10.3 mg/dL   Total Protein 8.0 6.5 - 8.1 g/dL   Albumin 3.2 (L) 3.5 - 5.0 g/dL    AST 11 (L) 15 - 41 U/L   ALT 7 0 - 44 U/L   Alkaline Phosphatase 70 38 - 126 U/L   Total Bilirubin 1.1 0.3 - 1.2 mg/dL   GFR calc non Af Amer 5 (L) >60 mL/min   GFR calc Af Amer 5 (L) >60 mL/min   Anion gap 17 (H) 5 - 15    Comment: Performed at [facility], [address] [place],  [address]   Dg Tibia/fibula Right  Result Date: 07/21/2018 CLINICAL DATA:  Patient with wound on the posterior right lower leg. History of peripheral vascular disease. EXAM: RIGHT TIBIA AND FIBULA - 2 VIEW COMPARISON:  Right lower extremity radiograph 05/30/2018 FINDINGS: Multiple skin staples are present. No radiopaque foreign body, soft tissue emphysema or osseous destruction. Degenerative changes of the knee. Vascular calcifications. Stent graft material visualized within the distal thigh. IMPRESSION: No radiographic evidence to suggest osteomyelitis. Electronically Signed   By: Lovey Newcomer M.D.   On: 07/21/2018 14:13   Dg Chest Portable 1 View  Result Date: 07/21/2018 CLINICAL DATA:  End-stage renal disease.  Hypertension. EXAM: PORTABLE CHEST 1 VIEW COMPARISON:  May 30, 2018 FINDINGS: Central catheter tips in superior vena cava. No pneumothorax. There is no evident edema or consolidation. Heart size and pulmonary vascularity are normal. No adenopathy. There is aortic atherosclerosis. There are stents in the left axillary brachial regions. No bone lesions. IMPRESSION: Central catheter as described without pneumothorax. No edema or consolidation. Stable cardiac silhouette. Aortic Atherosclerosis (ICD10-I70.0). Electronically Signed   By: Lowella Grip III M.D.   On: 07/21/2018 14:11    Pending Labs Unresulted Labs (From admission, onward)    Start     Ordered   07/22/18 5993  Basic metabolic panel  Tomorrow morning,   STAT  07/21/18 1535   07/22/18 0500  CBC  Tomorrow morning,   STAT     07/21/18 1535   07/21/18 1532  CBC  (heparin)  Once,   STAT    Comments:  Baseline for  heparin therapy IF NOT ALREADY DRAWN.  Notify MD if PLT < 100 K.    07/21/18 1535   07/21/18 1532  Creatinine, serum  (heparin)  Once,   STAT    Comments:  Baseline for heparin therapy IF NOT ALREADY DRAWN.    07/21/18 1535   07/21/18 1427  Wound or Superficial Culture  Once,   STAT     07/21/18 1427          Vitals/Pain Today's Vitals   07/21/18 1322 07/21/18 1330 07/21/18 1445 07/21/18 1457  BP: (!) 168/76 (!) 165/71  (!) 153/82  Pulse: (!) 110 (!) 110  (!) 111  Resp: 18 16  18   Temp: 98.4 F (36.9 C)     TempSrc: Oral     SpO2: 99% 100%  97%  Weight:      Height:      PainSc:   10-Worst pain ever     Isolation Precautions No active isolations  Medications Medications  piperacillin-tazobactam (ZOSYN) IVPB 3.375 g (3.375 g Intravenous New Bag/Given 07/21/18 1521)  vancomycin (VANCOCIN) 1,250 mg in sodium chloride 0.9 % 250 mL IVPB (has no administration in time range)  vancomycin (VANCOCIN) IVPB 750 mg/150 ml premix (has no administration in time range)  allopurinol (ZYLOPRIM) tablet 100 mg (has no administration in time range)  colchicine tablet 0.6 mg (has no administration in time range)  HYDROcodone-acetaminophen (NORCO/VICODIN) 5-325 MG per tablet 1 tablet (has no administration in time range)  furosemide (LASIX) tablet 40 mg (has no administration in time range)  carvedilol (COREG) tablet 6.25 mg (has no administration in time range)  atorvastatin (LIPITOR) tablet 10 mg (has no administration in time range)  amLODipine (NORVASC) tablet 10 mg (has no administration in time range)  metoprolol succinate (TOPROL-XL) 24 hr tablet 25 mg (has no administration in time range)  irbesartan (AVAPRO) tablet 150 mg (has no administration in time range)  hydrALAZINE (APRESOLINE) tablet 25 mg (has no administration in time range)  sevelamer carbonate (RENVELA) tablet 2,400 mg (has no administration in time range)  calcium acetate (PHOSLO) capsule 1,334 mg (has no administration  in time range)  calcitRIOL (ROCALTROL) capsule 0.5 mcg (has no administration in time range)  apixaban (ELIQUIS) tablet 2.5 mg (has no administration in time range)  pregabalin (LYRICA) capsule 50 mg (has no administration in time range)  gabapentin (NEURONTIN) capsule 300 mg (has no administration in time range)  clopidogrel (PLAVIX) tablet 75 mg (has no administration in time range)  heparin injection 5,000 Units (has no administration in time range)  acetaminophen (TYLENOL) tablet 650 mg (has no administration in time range)    Or  acetaminophen (TYLENOL) suppository 650 mg (has no administration in time range)  traZODone (DESYREL) tablet 25 mg (has no administration in time range)  docusate sodium (COLACE) capsule 100 mg (has no administration in time range)  bisacodyl (DULCOLAX) EC tablet 5 mg (has no administration in time range)  ondansetron (ZOFRAN) tablet 4 mg (has no administration in time range)    Or  ondansetron (ZOFRAN) injection 4 mg (has no administration in time range)    Mobility walks with device Low fall risk   Focused Assessments skin- pt has about a 6in long and 4 in wide infected wound  on back/inside of right leg; odor, sloughing, etc; wound culture sent   R Recommendations: See Admitting Provider Note  Report given to:   Additional Notes: one set of blood cultures were sent down to lab before antibiotics were started but no order has been put in for them yet; pt wound was rewrapped at 3:30pm; pt can be stuck in left arm and BP in left leg

## 2018-07-21 NOTE — ED Provider Notes (Signed)
[facility] Emergency Department Provider Note    First MD Initiated Contact with Patient 07/21/18 1320     (approximate)  I have reviewed the triage vital signs and the nursing notes.   HISTORY  Chief Complaint Wound Infection    HPI Johnny Cush. is a 68 y.o. male presents the ER with the below listed past medical history.  Patient end-stage renal disease on dialysis Monday Wednesday Friday.  Did not have dialysis yesterday.  Feels that he is very swollen.  Was sent to the ER after home health nurse came to check on the patient and noted that he had very foul-smelling warm erythematous and draining right lower leg wound.  States it was otherwise well-appearing on Tuesday.  Has not been on any antibiotics.  Denies any chest pain or shortness of breath no cough.  No measured fevers or chills.    Past Medical History:  Diagnosis Date  . Anemia   . Anxiety   . CHF (congestive heart failure) ([place])   . Chronic kidney disease    esrd dialysis m/w/f  . Gout   . Hyperlipidemia   . Hypertension   . Myocardial infarction ([place]) 2010   10 years ago  . 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  . Peripheral vascular disease ([place])    Family History  Problem Relation Age of Onset  . Hypertension Other   . Heart disease Other   . Diabetes Mother    Past Surgical History:  Procedure Laterality Date  . A/V SHUNTOGRAM Left 06/21/2017   Procedure: A/V SHUNTOGRAM;  Surgeon: Katha Cabal, MD;  Location: [place] [facility];  Service: Cardiovascular;  Laterality: Left;  . APPLICATION OF WOUND VAC Right 04/11/2018   Procedure: APPLICATION OF WOUND VAC;  Surgeon: Algernon Huxley, MD;  Location: [facility];  Service: Vascular;  Laterality: Right;  . AV FISTULA PLACEMENT Left 09/18/2015   Procedure: INSERTION OF ARTERIOVENOUS (AV) GORE-TEX GRAFT ARM ( BRACH/AXILLARY GRAFT W/ INSTANT STICK GRAFT );  Surgeon: Katha Cabal, MD;   Location: [facility];  Service: Vascular;  Laterality: Left;  . DIALYSIS FISTULA CREATION Right 10/2017   right chest perm cath  . ESOPHAGOGASTRODUODENOSCOPY N/A 12/19/2017   Procedure: ESOPHAGOGASTRODUODENOSCOPY (EGD);  Surgeon: Lin Landsman, MD;  Location: [facility];  Service: Gastroenterology;  Laterality: N/A;  . LOWER EXTREMITY ANGIOGRAPHY Left 11/16/2017   Procedure: LOWER EXTREMITY ANGIOGRAPHY;  Surgeon: Algernon Huxley, MD;  Location: [place] [facility];  Service: Cardiovascular;  Laterality: Left;  . LOWER EXTREMITY ANGIOGRAPHY Right 01/18/2018   Procedure: LOWER EXTREMITY ANGIOGRAPHY;  Surgeon: Algernon Huxley, MD;  Location: [place] [facility];  Service: Cardiovascular;  Laterality: Right;  . LOWER EXTREMITY ANGIOGRAPHY Left 04/02/2018   Procedure: LOWER EXTREMITY ANGIOGRAPHY;  Surgeon: Algernon Huxley, MD;  Location: [place] [facility];  Service: Cardiovascular;  Laterality: Left;  . LOWER EXTREMITY ANGIOGRAPHY Right 04/09/2018   Procedure: Lower Extremity Angiography with possible intervention;  Surgeon: Algernon Huxley, MD;  Location: [place] [facility];  Service: Cardiovascular;  Laterality: Right;  . PERIPHERAL VASCULAR CATHETERIZATION Left 09/01/2015   Procedure: A/V Shuntogram/Fistulagram;  Surgeon: Katha Cabal, MD;  Location: [place] [facility];  Service: Cardiovascular;  Laterality: Left;  . PERIPHERAL VASCULAR CATHETERIZATION N/A 09/30/2015   Procedure: A/V Shuntogram/Fistulagram with perm cathether removal;  Surgeon: Algernon Huxley, MD;  Location: [place] [facility];  Service: Cardiovascular;  Laterality: N/A;  .  PERIPHERAL VASCULAR CATHETERIZATION Left 09/30/2015   Procedure: A/V Shunt Intervention;  Surgeon: Algernon Huxley, MD;  Location: [place] [facility];  Service: Cardiovascular;  Laterality: Left;  . PERIPHERAL VASCULAR CATHETERIZATION Left 12/03/2015   Procedure: Thrombectomy;  Surgeon: Algernon Huxley, MD;  Location: [place] [facility];  Service: Cardiovascular;   Laterality: Left;  . PERIPHERAL VASCULAR CATHETERIZATION Left 01/28/2016   Procedure: Thrombectomy;  Surgeon: Algernon Huxley, MD;  Location: [place] [facility];  Service: Cardiovascular;  Laterality: Left;  . PERIPHERAL VASCULAR CATHETERIZATION N/A 01/28/2016   Procedure: A/V Shuntogram/Fistulagram;  Surgeon: Algernon Huxley, MD;  Location: [place] [facility];  Service: Cardiovascular;  Laterality: N/A;  . SKIN SPLIT GRAFT Right 05/24/2018   Procedure: SKIN GRAFT SPLIT THICKNESS ( RIGHT CALF);  Surgeon: Algernon Huxley, MD;  Location: [facility];  Service: Vascular;  Laterality: Right;  . WOUND DEBRIDEMENT Right 04/11/2018   Procedure: DEBRIDEMENT WOUND calf muscle and skin;  Surgeon: Algernon Huxley, MD;  Location: [facility];  Service: Vascular;  Laterality: Right;   Patient Active Problem List   Diagnosis Date Noted  . Lower limb ulcer, calf, right, limited to breakdown of skin (Normal) 06/08/2018  . Malnutrition of moderate degree 04/11/2018  . Pressure injury of skin 04/06/2018  . Altered mental status 04/04/2018  . Hypothermia 04/04/2018  . Hyperlipidemia 02/27/2018  . Diabetes ([place]) 02/27/2018  . Weakness of right lower extremity 01/20/2018  . Fever   . Periumbilical abdominal pain   . Confusion 12/22/2017  . Acute delirium 12/21/2017  . Protein-calorie malnutrition, severe 12/19/2017  . Intractable nausea and vomiting 12/18/2017  . Lymphedema 12/13/2017  . Cellulitis 11/27/2017  . Chest pain 11/19/2017  . Atherosclerosis of native arteries of the extremities with ulceration ([place]) 11/07/2017  . Elevated troponin 10/02/2015  . Complications, dialysis, catheter, mechanical ([place]) 10/02/2015  . Musculoskeletal chest pain 09/28/2015  . ESRD on dialysis ([place]) 09/28/2015  . HTN (hypertension) 09/28/2015  . Chronic diastolic CHF (congestive heart failure) ([place]) 09/28/2015  . Gout 09/28/2015      Prior to Admission medications   Medication Sig Start Date End Date Taking? Authorizing Provider   allopurinol (ZYLOPRIM) 100 MG tablet Take 100 mg by mouth daily.    [provider]  amLODipine (NORVASC) 10 MG tablet Take 10 mg by mouth daily.    [provider]  apixaban (ELIQUIS) 2.5 MG TABS tablet Take 2.5 mg by mouth 2 (two) times daily. Per pt. This has stopped for a while 03/28/18 06/08/18  [provider]  aspirin EC 81 MG tablet Take 1 tablet (81 mg total) by mouth daily. Patient not taking: Reported on 07/17/2018 04/02/18   Algernon Huxley, MD  atorvastatin (LIPITOR) 10 MG tablet Take 1 tablet by mouth daily. 11/23/17   [provider]  calcitRIOL (ROCALTROL) 0.5 MCG capsule Take 0.5 mcg by mouth daily.    [provider]  calcium acetate (PHOSLO) 667 MG capsule Take 1,334 mg by mouth 3 (three) times daily with meals.    [provider]  carvedilol (COREG) 6.25 MG tablet Take 6.25 mg by mouth every evening.    [provider]  clonazePAM (KLONOPIN) 0.5 MG tablet Take 1 tablet (0.5 mg total) by mouth as needed. 04/13/18   Henreitta Leber, MD  clopidogrel (PLAVIX) 75 MG tablet Take 1 tablet (75 mg total) by mouth daily. Patient taking differently: Take 75 mg by mouth daily. Per patient, this was stopped d/t blood thinning  making it difficult to come off of dialysis 04/02/18   Algernon Huxley, MD  colchicine 0.6 MG tablet Take 0.6 mg by mouth daily as needed (for gout flares).     [provider]  folic acid-vitamin b complex-vitamin c-selenium-zinc (DIALYVITE) 3 MG TABS tablet Take by mouth.    [provider]  furosemide (LASIX) 40 MG tablet Take 40 mg by mouth daily.     [provider]  gabapentin (NEURONTIN) 100 MG capsule Take 300 mg by mouth 3 (three) times daily. 03/28/18   [provider]  hydrALAZINE (APRESOLINE) 25 MG tablet Take 25 mg by mouth daily.    [provider]  HYDROcodone-acetaminophen (NORCO/VICODIN) 5-325 MG tablet Take 1 tablet by mouth every 4 (four) hours as  needed for moderate pain. 07/19/18   Algernon Huxley, MD  irbesartan (AVAPRO) 150 MG tablet Take 150 mg by mouth 2 (two) times daily.    [provider]  metoprolol succinate (TOPROL-XL) 25 MG 24 hr tablet Take 25 mg by mouth at bedtime. 03/02/18   [provider]  pregabalin (LYRICA) 50 MG capsule Take 50 mg by mouth daily.    [provider]  sevelamer carbonate (RENVELA) 800 MG tablet Take 2,400 mg by mouth 3 (three) times daily with meals.    [provider]    Allergies Shellfish allergy    Social History Social History   Tobacco Use  . Smoking status: Former Smoker    Types: Cigarettes    Last attempt to quit: 05/17/2005    Years since quitting: 13.1  . Smokeless tobacco: Never Used  Substance Use Topics  . Alcohol use: No  . Drug use: No    Review of Systems Patient denies headaches, rhinorrhea, blurry vision, numbness, shortness of breath, chest pain, edema, cough, abdominal pain, nausea, vomiting, diarrhea, dysuria, fevers, rashes or hallucinations unless otherwise stated above in HPI. ____________________________________________   PHYSICAL EXAM:  VITAL SIGNS: Vitals:   07/21/18 1330 07/21/18 1457  BP: (!) 165/71 (!) 153/82  Pulse: (!) 110 (!) 111  Resp: 16 18  Temp:    SpO2: 100% 97%    Constitutional: Alert and oriented.  Eyes: Conjunctivae are normal.  Head: Atraumatic. Nose: No congestion/rhinnorhea. Mouth/Throat: Mucous membranes are moist.   Neck: No stridor. Painless ROM.  Cardiovascular: Normal rate, regular rhythm. Grossly normal heart sounds.  Good peripheral circulation. Respiratory: Normal respiratory effort.  No retractions. Lungs CTAB. Gastrointestinal: Soft and nontender. No distention. No abdominal bruits. No CVA tenderness. Genitourinary:  Musculoskeletal: Right upper stream in the swelling but good distal perfusion.  Has a very foul-smelling right posterior lower leg wound and ulcer with purulent drainage  there is overlying warmth.  No crepitus.   Neurologic:  Normal speech and language. No gross focal neurologic deficits are appreciated. No facial droop Skin:  Skin is warm, dry and intact. No rash noted. Psychiatric: Mood and affect are normal. Speech and behavior are normal.  ____________________________________________   LABS (all labs ordered are listed, but only abnormal results are displayed)  Results for orders placed or performed during the hospital encounter of 07/21/18 (from the past 24 hour(s))  CBC with Differential/Platelet     Status: Abnormal   Collection Time: 07/21/18  1:43 PM  Result Value Ref Range   WBC 17.9 (H) 4.0 - 10.5 K/uL   RBC 3.60 (L) 4.22 - 5.81 MIL/uL   Hemoglobin 10.8 (L) 13.0 - 17.0 g/dL   HCT 34.4 (L) 39.0 - 52.0 %  MCV 95.6 80.0 - 100.0 fL   MCH 30.0 26.0 - 34.0 pg   MCHC 31.4 30.0 - 36.0 g/dL   RDW 13.5 11.5 - 15.5 %   Platelets 283 150 - 400 K/uL   nRBC 0.0 0.0 - 0.2 %   Neutrophils Relative % 79 %   Neutro Abs 14.4 (H) 1.7 - 7.7 K/uL   Lymphocytes Relative 8 %   Lymphs Abs 1.5 0.7 - 4.0 K/uL   Monocytes Relative 10 %   Monocytes Absolute 1.7 (H) 0.1 - 1.0 K/uL   Eosinophils Relative 1 %   Eosinophils Absolute 0.1 0.0 - 0.5 K/uL   Basophils Relative 1 %   Basophils Absolute 0.1 0.0 - 0.1 K/uL   Immature Granulocytes 1 %   Abs Immature Granulocytes 0.11 (H) 0.00 - 0.07 K/uL  Comprehensive metabolic panel     Status: Abnormal   Collection Time: 07/21/18  1:43 PM  Result Value Ref Range   Sodium 139 135 - 145 mmol/L   Potassium 5.2 (H) 3.5 - 5.1 mmol/L   Chloride 103 98 - 111 mmol/L   CO2 19 (L) 22 - 32 mmol/L   Glucose, Bld 88 70 - 99 mg/dL   BUN 59 (H) 8 - 23 mg/dL   Creatinine, Ser 10.04 (H) 0.61 - 1.24 mg/dL   Calcium 10.1 8.9 - 10.3 mg/dL   Total Protein 8.0 6.5 - 8.1 g/dL   Albumin 3.2 (L) 3.5 - 5.0 g/dL   AST 11 (L) 15 - 41 U/L   ALT 7 0 - 44 U/L   Alkaline Phosphatase 70 38 - 126 U/L   Total Bilirubin 1.1 0.3 - 1.2 mg/dL    GFR calc non Af Amer 5 (L) >60 mL/min   GFR calc Af Amer 5 (L) >60 mL/min   Anion gap 17 (H) 5 - 15   ____________________________________________ ____________________________________________  RADIOLOGY  I personally reviewed all radiographic images ordered to evaluate for the above acute complaints and reviewed radiology reports and findings.  These findings were personally discussed with the patient.  Please see medical record for radiology report.  ____________________________________________   PROCEDURES  Procedure(s) performed:  Procedures    Critical Care performed: no ____________________________________________   INITIAL IMPRESSION / ASSESSMENT AND PLAN / ED COURSE  Pertinent labs & imaging results that were available during my care of the patient were reviewed by me and considered in my medical decision making (see chart for details).   DDX: Diabetic foot wound, cellulitis, ischemic leg, osteomyelitis, sepsis  Johnny Kruer. is a 68 y.o. who presents to the ED with symptoms as described above.  Patient's not febrile but is tachycardic with leukocytosis making him sirs positive.  Does have very foul-smelling wound.  Does not show any evidence of acute volume overload.  Potassium 5.2.  Denies any chest pain at this time.  Is having right lower extremity pain.  Does appear perfused.  Will start broad-spectrum antibiotics for wound infection.  Patient will require admission to hospital for IV antibiotics and further hemodynamic monitoring.      As part of my medical decision making, I reviewed the following data within the [place] notes reviewed and incorporated, Labs reviewed, notes from prior ED visits and  Controlled Substance Database   ____________________________________________   FINAL CLINICAL IMPRESSION(S) / ED DIAGNOSES  Final diagnoses:  Cellulitis of right lower extremity  Wound infection      NEW MEDICATIONS  STARTED DURING THIS VISIT:  New  Prescriptions   No medications on file     Note:  This document was prepared using Dragon voice recognition software and may include unintentional dictation errors.    Merlyn Lot, MD 07/21/18 [phone]

## 2018-07-22 ENCOUNTER — Encounter: Payer: Self-pay | Admitting: Vascular Surgery

## 2018-07-22 DIAGNOSIS — I70261 Atherosclerosis of native arteries of extremities with gangrene, right leg: Secondary | ICD-10-CM

## 2018-07-22 LAB — BASIC METABOLIC PANEL
Anion gap: 18 — ABNORMAL HIGH (ref 5–15)
Anion gap: 16 — ABNORMAL HIGH (ref 5–15)
BUN: 69 mg/dL — ABNORMAL HIGH (ref 8–23)
BUN: 75 mg/dL — ABNORMAL HIGH (ref 8–23)
CO2: 20 mmol/L — ABNORMAL LOW (ref 22–32)
CO2: 20 mmol/L — AB (ref 22–32)
Calcium: 9.9 mg/dL (ref 8.9–10.3)
Calcium: 9.4 mg/dL (ref 8.9–10.3)
Chloride: 102 mmol/L (ref 98–111)
Chloride: 105 mmol/L (ref 98–111)
Creatinine, Ser: 10.99 mg/dL — ABNORMAL HIGH (ref 0.61–1.24)
Creatinine, Ser: 12.14 mg/dL — ABNORMAL HIGH (ref 0.61–1.24)
GFR calc Af Amer: 5 mL/min — ABNORMAL LOW (ref >60)
GFR calc Af Amer: 4 mL/min — ABNORMAL LOW (ref >60)
GFR calc non Af Amer: 4 mL/min — ABNORMAL LOW (ref >60)
GFR calc non Af Amer: 4 mL/min — ABNORMAL LOW (ref >60)
Glucose, Bld: 98 mg/dL (ref 70–99)
Glucose, Bld: 107 mg/dL — ABNORMAL HIGH (ref 70–99)
POTASSIUM: 5.2 mmol/L — AB (ref 3.5–5.1)
Potassium: 5 mmol/L (ref 3.5–5.1)
Sodium: 140 mmol/L (ref 135–145)
Sodium: 141 mmol/L (ref 135–145)

## 2018-07-22 LAB — CBC
HCT: 33.7 % — ABNORMAL LOW (ref 39.0–52.0)
HEMOGLOBIN: 10.2 g/dL — AB (ref 13.0–17.0)
MCH: 29.6 pg (ref 26.0–34.0)
MCHC: 30.3 g/dL (ref 30.0–36.0)
MCV: 97.7 fL (ref 80.0–100.0)
Platelets: 283 10*3/uL (ref 150–400)
RBC: 3.45 MIL/uL — ABNORMAL LOW (ref 4.22–5.81)
RDW: 13.7 % (ref 11.5–15.5)
WBC: 15.8 10*3/uL — ABNORMAL HIGH (ref 4.0–10.5)
nRBC: 0 % (ref 0.0–0.2)

## 2018-07-22 LAB — GLUCOSE, CAPILLARY
Glucose-Capillary: 81 mg/dL (ref 70–99)
Glucose-Capillary: 118 mg/dL — ABNORMAL HIGH (ref 70–99)
Glucose-Capillary: 81 mg/dL (ref 70–99)

## 2018-07-22 MED ORDER — PREDNISONE 50 MG PO TABS
50.0000 mg | ORAL_TABLET | Freq: Four times a day (QID) | ORAL | Status: AC
  Administered 2018-07-22 – 2018-07-23 (×2): 50 mg via ORAL
  Filled 2018-07-22 (×2): qty 1

## 2018-07-22 MED ORDER — DIPHENHYDRAMINE HCL 25 MG PO CAPS: 50.0000 mg | ORAL_CAPSULE | Freq: Once | ORAL | Status: DC

## 2018-07-22 MED ORDER — CHLORHEXIDINE GLUCONATE CLOTH 2 % EX PADS
6.0000 | MEDICATED_PAD | Freq: Every day | CUTANEOUS | Status: DC
  Administered 2018-07-23 – 2018-07-26 (×4): 6 via TOPICAL

## 2018-07-22 MED ORDER — GABAPENTIN 300 MG PO CAPS
300.0000 mg | ORAL_CAPSULE | Freq: Every day | ORAL | Status: DC
  Administered 2018-07-23 – 2018-07-24 (×2): 300 mg via ORAL
  Filled 2018-07-22 (×2): qty 1

## 2018-07-22 MED ORDER — SODIUM CHLORIDE 0.9 % IV SOLN
INTRAVENOUS | Status: DC
  Administered 2018-07-22: 18:00:00 via INTRAVENOUS

## 2018-07-22 MED ORDER — DIPHENHYDRAMINE HCL 50 MG/ML IJ SOLN: 50.0000 mg | Freq: Once | INTRAMUSCULAR | Status: DC

## 2018-07-22 MED ORDER — CHLORHEXIDINE GLUCONATE 4 % EX LIQD
1.0000 "application " | Freq: Once | CUTANEOUS | Status: AC
  Administered 2018-07-23: 1 via TOPICAL

## 2018-07-22 NOTE — Progress Notes (Signed)
[place] [facility] [place] NAME: Johnny [name]    MR#:  [id number]  DATE OF BIRTH:  [birth year]  SUBJECTIVE:   patient sent from wound nurse for foul smelling calf ulcer  REVIEW OF SYSTEMS:    Review of Systems  Constitutional: Negative for fever, chills weight loss HENT: Negative for ear pain, nosebleeds, congestion, facial swelling, rhinorrhea, neck pain, neck stiffness and ear discharge.   Respiratory: Negative for cough, shortness of breath, wheezing  Cardiovascular: Negative for chest pain, palpitations and leg swelling.  Gastrointestinal: Negative for heartburn, abdominal pain, vomiting, diarrhea or consitpation Genitourinary: Negative for dysuria, urgency, frequency, hematuria Musculoskeletal: Negative for back pain or joint pain Neurological: Negative for dizziness, seizures, syncope, focal weakness,  numbness and headaches.  Hematological: Does not bruise/bleed easily.  Psychiatric/Behavioral: Negative for hallucinations, confusion, dysphoric mood  SKIN: lower right leg ulcer foul smelling  Tolerating Diet: yes      DRUG ALLERGIES:   Allergies  Allergen Reactions  . Shellfish Allergy Anaphylaxis    VITALS:  Blood pressure (!) 120/48, pulse 87, temperature 97.8 F (36.6 C), temperature source Oral, resp. rate 20, height 5\' 8"  (1.727 m), weight 66 kg, SpO2 100 %.  PHYSICAL EXAMINATION:  Constitutional: Appears well-developed and well-nourished. No distress. HENT: Normocephalic. [name] Oropharynx is clear and moist.  Eyes: Conjunctivae and EOM are normal. PERRLA, no scleral icterus.  Neck: Normal ROM. Neck supple. No JVD. No tracheal deviation. CVS: RRR, S1/S2 +, no murmurs, no gallops, no carotid bruit.  Pulmonary: Effort and breath sounds normal, no stridor, rhonchi, wheezes, rales.  Abdominal: Soft. BS +,  no distension, tenderness, rebound or guarding.  Musculoskeletal: Normal range of motion. No edema and no tenderness.   Neuro: Alert. CN 2-12 grossly intact. No focal deficits. Skin: foul smelling ulcer noted right leg  Psychiatric: Normal mood and affect.      LABORATORY PANEL:   CBC Recent Labs  Lab 07/22/18 0457  WBC 15.8*  HGB 10.2*  HCT 33.7*  PLT 283   ------------------------------------------------------------------------------------------------------------------  Chemistries  Recent Labs  Lab 07/21/18 1343 07/22/18 0457  NA 139 140  K 5.2* 5.2*  CL 103 102  CO2 19* 20*  GLUCOSE 88 98  BUN 59* 69*  CREATININE 10.04* 10.99*  CALCIUM 10.1 9.9  AST 11*  --   ALT 7  --   ALKPHOS 70  --   BILITOT 1.1  --    ------------------------------------------------------------------------------------------------------------------  Cardiac Enzymes No results for input(s): TROPONINI in the last 168 hours. ------------------------------------------------------------------------------------------------------------------  RADIOLOGY:  Dg Tibia/fibula Right  Result Date: 07/21/2018 CLINICAL DATA:  Patient with wound on the posterior right lower leg. History of peripheral vascular disease. EXAM: RIGHT TIBIA AND FIBULA - 2 VIEW COMPARISON:  Right lower extremity radiograph 05/30/2018 FINDINGS: Multiple skin staples are present. No radiopaque foreign body, soft tissue emphysema or osseous destruction. Degenerative changes of the knee. Vascular calcifications. Stent graft material visualized within the distal thigh. IMPRESSION: No radiographic evidence to suggest osteomyelitis. Electronically Signed   By: Lovey Newcomer M.D.   On: 07/21/2018 14:13   Dg Chest Portable 1 View  Result Date: 07/21/2018 CLINICAL DATA:  End-stage renal disease.  Hypertension. EXAM: PORTABLE CHEST 1 VIEW COMPARISON:  May 30, 2018 FINDINGS: Central catheter tips in superior vena cava. No pneumothorax. There is no evident edema or consolidation. Heart size and pulmonary vascularity are normal. No adenopathy. There is aortic  atherosclerosis. There are stents in the left axillary brachial regions. No  bone lesions. IMPRESSION: Central catheter as described without pneumothorax. No edema or consolidation. Stable cardiac silhouette. Aortic Atherosclerosis (ICD10-I70.0). Electronically Signed   By: Lowella Grip III M.D.   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  1.  Lower limb right calf ulcer: Consultation placed to vascular surgery for further evaluation and recommendation. Continue Zosyn and vancomycin. Wound care consultation pending May need MRI to evaluate for underlying osteomyelitis.  2.  End-stage renal disease on hemodialysis: Consultation for nephrology has been placed.  3.  Essential hypertension: Continue Norvasc, hydralazine, Coreg, irbesartan  4.  Hyperlipidemia: Continue statin  5.  History of peripheral vascular disease: Continue Eliquis and statin  6.  History of gout: Continue colchicine  7.  Chronic diastolic heart failure: No signs of exacerbation      Management plans discussed with the patient and he is in agreement.  CODE STATUS: Full  TOTAL TIME TAKING CARE OF THIS PATIENT: 30 minutes.     POSSIBLE D/C 2 to 4 days, DEPENDING ON CLINICAL CONDITION.   Makinzie Considine M.D on 07/22/2018 at 9:42 AM  Between 7am to 6pm - Pager - [phone] After 6pm go to [email or web address] - password [email or web address] [facility]  Office  [phone]  CC: Primary care physician; [name], MD  Note: This dictation was prepared with Dragon dictation along with smaller phrase technology. Any transcriptional errors that result from this process are unintentional.

## 2018-07-22 NOTE — Consult Note (Signed)
Reason for Consult: Nonhealing ulcer Referring Physician: Dr. Horatio Pel Aravind Chrismer. is an 68 y.o. male.  HPI: The patient presents with a chronic right ankle wound for which he has undergone previous interventional treatment.  He also had debridement and skin graft placed.  He had been doing well and seen in the office.  However he was seen by the wound care nurse at home a few days ago.  It was noted that they skin graft was discolored and there was drainage.  The visiting nurse recommended he go to the emergency department for evaluation.  He came into the hospital yesterday for evaluation.  Vascular was consulted to see him today.  The patient did not have a fever or chills.  Past Medical History:  Diagnosis Date  . Anemia   . Anxiety   . CHF (congestive heart failure) ([place])   . Chronic kidney disease    esrd dialysis m/w/f  . Gout   . Hyperlipidemia   . Hypertension   . Myocardial infarction ([place]) 2010   10 years ago  . 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  . Peripheral vascular disease [facility])     Past Surgical History:  Procedure Laterality Date  . A/V SHUNTOGRAM Left 06/21/2017   Procedure: A/V SHUNTOGRAM;  Surgeon: Katha Cabal, MD;  Location: [place] CV LAB;  Service: Cardiovascular;  Laterality: Left;  . APPLICATION OF WOUND VAC Right 04/11/2018   Procedure: APPLICATION OF WOUND VAC;  Surgeon: Algernon Huxley, MD;  Location: [facility] ORS;  Service: Vascular;  Laterality: Right;  . AV FISTULA PLACEMENT Left 09/18/2015   Procedure: INSERTION OF ARTERIOVENOUS (AV) GORE-TEX GRAFT ARM ( BRACH/AXILLARY GRAFT W/ INSTANT STICK GRAFT );  Surgeon: Katha Cabal, MD;  Location: [facility] ORS;  Service: Vascular;  Laterality: Left;  . DIALYSIS FISTULA CREATION Right 10/2017   right chest perm cath  . ESOPHAGOGASTRODUODENOSCOPY N/A 12/19/2017   Procedure: ESOPHAGOGASTRODUODENOSCOPY (EGD);  Surgeon: Lin Landsman, MD;  Location: [facility] ENDOSCOPY;   Service: Gastroenterology;  Laterality: N/A;  . LOWER EXTREMITY ANGIOGRAPHY Left 11/16/2017   Procedure: LOWER EXTREMITY ANGIOGRAPHY;  Surgeon: Algernon Huxley, MD;  Location: [place] CV LAB;  Service: Cardiovascular;  Laterality: Left;  . LOWER EXTREMITY ANGIOGRAPHY Right 01/18/2018   Procedure: LOWER EXTREMITY ANGIOGRAPHY;  Surgeon: Algernon Huxley, MD;  Location: [place] CV LAB;  Service: Cardiovascular;  Laterality: Right;  . LOWER EXTREMITY ANGIOGRAPHY Left 04/02/2018   Procedure: LOWER EXTREMITY ANGIOGRAPHY;  Surgeon: Algernon Huxley, MD;  Location: [place] CV LAB;  Service: Cardiovascular;  Laterality: Left;  . LOWER EXTREMITY ANGIOGRAPHY Right 04/09/2018   Procedure: Lower Extremity Angiography with possible intervention;  Surgeon: Algernon Huxley, MD;  Location: [place] CV LAB;  Service: Cardiovascular;  Laterality: Right;  . PERIPHERAL VASCULAR CATHETERIZATION Left 09/01/2015   Procedure: A/V Shuntogram/Fistulagram;  Surgeon: Katha Cabal, MD;  Location: [place] CV LAB;  Service: Cardiovascular;  Laterality: Left;  . PERIPHERAL VASCULAR CATHETERIZATION N/A 09/30/2015   Procedure: A/V Shuntogram/Fistulagram with perm cathether removal;  Surgeon: Algernon Huxley, MD;  Location: [place] CV LAB;  Service: Cardiovascular;  Laterality: N/A;  . PERIPHERAL VASCULAR CATHETERIZATION Left 09/30/2015   Procedure: A/V Shunt Intervention;  Surgeon: Algernon Huxley, MD;  Location: [place] CV LAB;  Service: Cardiovascular;  Laterality: Left;  . PERIPHERAL VASCULAR CATHETERIZATION Left 12/03/2015   Procedure: Thrombectomy;  Surgeon: Algernon Huxley, MD;  Location: [place] CV LAB;  Service: Cardiovascular;  Laterality: Left;  . PERIPHERAL VASCULAR CATHETERIZATION Left 01/28/2016   Procedure: Thrombectomy;  Surgeon: Algernon Huxley, MD;  Location: [place] CV LAB;  Service: Cardiovascular;  Laterality: Left;  . PERIPHERAL VASCULAR CATHETERIZATION N/A 01/28/2016   Procedure: A/V  Shuntogram/Fistulagram;  Surgeon: Algernon Huxley, MD;  Location: [place] CV LAB;  Service: Cardiovascular;  Laterality: N/A;  . SKIN SPLIT GRAFT Right 05/24/2018   Procedure: SKIN GRAFT SPLIT THICKNESS ( RIGHT CALF);  Surgeon: Algernon Huxley, MD;  Location: [facility] ORS;  Service: Vascular;  Laterality: Right;  . WOUND DEBRIDEMENT Right 04/11/2018   Procedure: DEBRIDEMENT WOUND calf muscle and skin;  Surgeon: Algernon Huxley, MD;  Location: [facility] ORS;  Service: Vascular;  Laterality: Right;    Family History  Problem Relation Age of Onset  . Hypertension Other   . Heart disease Other   . Diabetes Mother     Social History:  reports that he quit smoking about 13 years ago. His smoking use included cigarettes. He has never used smokeless tobacco. He reports that he does not drink alcohol or use drugs.  Allergies:  Allergies  Allergen Reactions  . Shellfish Allergy Anaphylaxis    Medications: I have reviewed the patient's current medications.  Results for orders placed or performed during the hospital encounter of 07/21/18 (from the past 48 hour(s))  CBC with Differential/Platelet     Status: Abnormal   Collection Time: 07/21/18  1:43 PM  Result Value Ref Range   WBC 17.9 (H) 4.0 - 10.5 K/uL   RBC 3.60 (L) 4.22 - 5.81 MIL/uL   Hemoglobin 10.8 (L) 13.0 - 17.0 g/dL   HCT 34.4 (L) 39.0 - 52.0 %   MCV 95.6 80.0 - 100.0 fL   MCH 30.0 26.0 - 34.0 pg   MCHC 31.4 30.0 - 36.0 g/dL   RDW 13.5 11.5 - 15.5 %   Platelets 283 150 - 400 K/uL   nRBC 0.0 0.0 - 0.2 %   Neutrophils Relative % 79 %   Neutro Abs 14.4 (H) 1.7 - 7.7 K/uL   Lymphocytes Relative 8 %   Lymphs Abs 1.5 0.7 - 4.0 K/uL   Monocytes Relative 10 %   Monocytes Absolute 1.7 (H) 0.1 - 1.0 K/uL   Eosinophils Relative 1 %   Eosinophils Absolute 0.1 0.0 - 0.5 K/uL   Basophils Relative 1 %   Basophils Absolute 0.1 0.0 - 0.1 K/uL   Immature Granulocytes 1 %   Abs Immature Granulocytes 0.11 (H) 0.00 - 0.07 K/uL    Comment: Performed at  [facility], [address] [place], [place] [address]  Comprehensive metabolic panel     Status: Abnormal   Collection Time: 07/21/18  1:43 PM  Result Value Ref Range   Sodium 139 135 - 145 mmol/L   Potassium 5.2 (H) 3.5 - 5.1 mmol/L    Comment: HEMOLYSIS AT THIS LEVEL MAY AFFECT RESULT   Chloride 103 98 - 111 mmol/L   CO2 19 (L) 22 - 32 mmol/L   Glucose, Bld 88 70 - 99 mg/dL   BUN 59 (H) 8 - 23 mg/dL   Creatinine, Ser 10.04 (H) 0.61 - 1.24 mg/dL   Calcium 10.1 8.9 - 10.3 mg/dL   Total Protein 8.0 6.5 - 8.1 g/dL   Albumin 3.2 (L) 3.5 - 5.0 g/dL   AST 11 (L) 15 - 41 U/L   ALT 7 0 - 44 U/L   Alkaline Phosphatase 70 38 - 126  U/L   Total Bilirubin 1.1 0.3 - 1.2 mg/dL   GFR calc non Af Amer 5 (L) >60 mL/min   GFR calc Af Amer 5 (L) >60 mL/min   Anion gap 17 (H) 5 - 15    Comment: Performed at [facility], [address] [place], [place] [address]  Wound or Superficial Culture     Status: None (Preliminary result)   Collection Time: 07/21/18  2:40 PM  Result Value Ref Range   Specimen Description      WOUND Performed at [facility], [address], [place], [place] [address]    Special Requests      NONE Performed at [facility], [place], [place] [address]    Gram Stain      FEW WBC PRESENT, PREDOMINANTLY PMN ABUNDANT GRAM NEGATIVE RODS ABUNDANT GRAM POSITIVE COCCI IN PAIRS IN CLUSTERS Performed at [facility], [place] [address], [place], [place] [address]    Culture PENDING    Report Status PENDING   Blood culture (single)     Status: None (Preliminary result)   Collection Time: 07/21/18  5:35 PM  Result Value Ref Range   Specimen Description BLOOD LFOA    Special Requests      BOTTLES DRAWN AEROBIC AND ANAEROBIC Blood Culture adequate volume   Culture      NO GROWTH < 12 HOURS Performed at [facility], [address] [place], [place] [address]    Report Status PENDING   Basic metabolic  panel     Status: Abnormal   Collection Time: 07/22/18  4:57 AM  Result Value Ref Range   Sodium 140 135 - 145 mmol/L   Potassium 5.2 (H) 3.5 - 5.1 mmol/L   Chloride 102 98 - 111 mmol/L   CO2 20 (L) 22 - 32 mmol/L   Glucose, Bld 98 70 - 99 mg/dL   BUN 69 (H) 8 - 23 mg/dL   Creatinine, Ser 10.99 (H) 0.61 - 1.24 mg/dL   Calcium 9.9 8.9 - 10.3 mg/dL   GFR calc non Af Amer 4 (L) >60 mL/min   GFR calc Af Amer 5 (L) >60 mL/min   Anion gap 18 (H) 5 - 15    Comment: Performed at [facility], [address] [place],  [address]  CBC     Status: Abnormal   Collection Time: 07/22/18  4:57 AM  Result Value Ref Range   WBC 15.8 (H) 4.0 - 10.5 K/uL   RBC 3.45 (L) 4.22 - 5.81 MIL/uL   Hemoglobin 10.2 (L) 13.0 - 17.0 g/dL   HCT 33.7 (L) 39.0 - 52.0 %   MCV 97.7 80.0 - 100.0 fL   MCH 29.6 26.0 - 34.0 pg   MCHC 30.3 30.0 - 36.0 g/dL   RDW 13.7 11.5 - 15.5 %   Platelets 283 150 - 400 K/uL   nRBC 0.0 0.0 - 0.2 %    Comment: Performed at [facility], [address] [place], [place] [address]  Glucose, capillary     Status: None   Collection Time: 07/22/18  8:01 AM  Result Value Ref Range   Glucose-Capillary 81 70 - 99 mg/dL   Comment 1 Notify RN     Dg Tibia/fibula Right  Result Date: 07/21/2018 CLINICAL DATA:  Patient with wound on the posterior right lower leg. History of peripheral vascular disease. EXAM: RIGHT TIBIA AND FIBULA - 2 VIEW COMPARISON:  Right lower extremity radiograph  05/30/2018 FINDINGS: Multiple skin staples are present. No radiopaque foreign body, soft tissue emphysema or osseous destruction. Degenerative changes of the knee. Vascular calcifications. Stent graft material visualized within the distal thigh. IMPRESSION: No radiographic evidence to suggest osteomyelitis. Electronically Signed   By: Lovey Newcomer M.D.   On: 07/21/2018 14:13   Dg Chest Portable 1 View  Result Date: 07/21/2018 CLINICAL DATA:  End-stage renal disease.  Hypertension. EXAM:  PORTABLE CHEST 1 VIEW COMPARISON:  May 30, 2018 FINDINGS: Central catheter tips in superior vena cava. No pneumothorax. There is no evident edema or consolidation. Heart size and pulmonary vascularity are normal. No adenopathy. There is aortic atherosclerosis. There are stents in the left axillary brachial regions. No bone lesions. IMPRESSION: Central catheter as described without pneumothorax. No edema or consolidation. Stable cardiac silhouette. Aortic Atherosclerosis (ICD10-I70.0). Electronically Signed   By: Lowella Grip III M.D.   On: 07/21/2018 14:11    Review of Systems  Constitutional: Negative.   HENT: Negative.   Eyes: Negative.   Respiratory: Negative.   Cardiovascular: Positive for leg swelling.  Gastrointestinal: Negative.   Musculoskeletal: Negative.   Skin: Negative.   Neurological: Negative.   Psychiatric/Behavioral: Positive for memory loss.   Blood pressure (!) 120/48, pulse 87, temperature 97.8 F (36.6 C), temperature source Oral, resp. rate 20, height 5\' 8"  (1.727 m), weight 66 kg, SpO2 100 %. Physical Exam  Constitutional: He is oriented to person, place, and time. He appears well-developed.  HENT:  Head: Normocephalic.  Cardiovascular: Normal rate.  Pulses:      Femoral pulses are 2+ on the right side and 2+ on the left side.      Dorsalis pedis pulses are 0 on the right side.       Posterior tibial pulses are 0 on the right side.  Respiratory: Effort normal.  Musculoskeletal: Normal range of motion.        General: Edema present.  Neurological: He is alert and oriented to person, place, and time.  Skin:     Psychiatric: He has a normal mood and affect.    Assessment/Plan: Mr. Orndoff has significant peripheral arterial disease.  He has had multiple angiograms in the past.  He will require further angiography.  I will leave the timing up to Dr. Lucky Cowboy after he returns tomorrow.  The previous skin graft has failed with gangrenous changes present.   Continue with dressing changes using wet-to-dry gauze.  Further recommendations to follow tomorrow.  I will initiate him on premedication for his shellfish allergy in anticipation of angiography.  Juanna Cao 07/22/2018, 10:38 AM

## 2018-07-22 NOTE — Progress Notes (Signed)
[place], [place] 07/22/18  Subjective:   Patient known to our practice from outpatient dialysis.  He underwent right upper brachial artery to axillary vein AV graft placement on March 12. On March 14, patient came in from home after his home health nurse noted worsening and foul-smelling wound on the right lower extremity.  He was admitted for IV antibiotic administration and further management of the wound  Objective:  Vital signs in last 24 hours:  Temp:  [97.8 F (36.6 C)-98.4 F (36.9 C)] 97.8 F (36.6 C) (03/14 2050) Pulse Rate:  [87-111] 87 (03/15 0518) Resp:  [16-20] 20 (03/14 2050) BP: (120-182)/(48-93) 120/48 (03/15 0518) SpO2:  [97 %-100 %] 100 % (03/14 2050) Weight:  [66 kg] 66 kg (03/14 1320)  Weight change:  Filed Weights   07/21/18 1320  Weight: 66 kg    Intake/Output:    Intake/Output Summary (Last 24 hours) at 07/22/2018 1129 Last data filed at 07/22/2018 0423 Gross per 24 hour  Intake 380.12 ml  Output -  Net 380.12 ml     Physical Exam: General:  Chronically ill-appearing, laying in the bed  HEENT  anicteric, moist oral mucous membranes  Neck  supple  Pulm/lungs  normal breathing effort, clear to auscultation  CVS/Heart  no rub  Abdomen:   Soft, nontender  Extremities:  Right arm edema around the new AV graft  Neurologic:  Alert, follows some commands, confused  Skin:  Foul-smelling wound in the right leg and mid shin area  Access:  Right IJ PermCath, right upper arm AV graft-newly placed       Basic Metabolic Panel:  Recent Labs  Lab 07/17/18 1412 07/19/18 1058 07/21/18 1343 07/22/18 0457  NA 140 142 139 140  K 4.1 3.4* 5.2* 5.2*  CL 100  --  103 102  CO2 24  --  19* 20*  GLUCOSE 87 64* 88 98  BUN 34*  --  59* 69*  CREATININE 7.11*  --  10.04* 10.99*  CALCIUM 9.7  --  10.1 9.9     CBC: Recent Labs  Lab 07/17/18 1412 07/19/18 1058 07/21/18 1343 07/22/18 0457  WBC 12.2*  --  17.9* 15.8*   NEUTROABS 9.3*  --  14.4*  --   HGB 11.9* 9.5* 10.8* 10.2*  HCT 38.4* 28.0* 34.4* 33.7*  MCV 95.8  --  95.6 97.7  PLT 274  --  283 283      Lab Results  Component Value Date   HEPBSAG Negative 09/30/2015   HEPBSAB Reactive 01/29/2018      Microbiology:  Recent Results (from the past 240 hour(s))  Surgical pcr screen     Status: Abnormal   Collection Time: 07/17/18  2:49 PM  Result Value Ref Range Status   MRSA, PCR NEGATIVE NEGATIVE Final   Staphylococcus aureus POSITIVE (A) NEGATIVE Final    Comment: (NOTE) The Xpert SA Assay (FDA approved for NASAL specimens in patients 20 years of age and older), is one component of a comprehensive surveillance program. It is not intended to diagnose infection nor to guide or monitor treatment. Performed at [facility], [address] [place], [place] [address]   Wound or Superficial Culture     Status: None (Preliminary result)   Collection Time: 07/21/18  2:40 PM  Result Value Ref Range Status   Specimen Description   Final    WOUND Performed at [facility], [address], [place], [place] [address]    Special  Requests   Final    NONE Performed at [facility], [address] [place], [place] [address]    Gram Stain   Final    FEW WBC PRESENT, PREDOMINANTLY PMN ABUNDANT GRAM NEGATIVE RODS ABUNDANT GRAM POSITIVE COCCI IN PAIRS IN CLUSTERS Performed at [facility], [place] [address], [place], [place] [address]    Culture PENDING  Incomplete   Report Status PENDING  Incomplete  Blood culture (single)     Status: None (Preliminary result)   Collection Time: 07/21/18  5:35 PM  Result Value Ref Range Status   Specimen Description BLOOD LFOA  Final   Special Requests   Final    BOTTLES DRAWN AEROBIC AND ANAEROBIC Blood Culture adequate volume   Culture   Final    NO GROWTH < 12 HOURS Performed at [facility], [address] [place], [place] [address]    Report Status  PENDING  Incomplete    Coagulation Studies: No results for input(s): LABPROT, INR in the last 72 hours.  Urinalysis: No results for input(s): COLORURINE, LABSPEC, PHURINE, GLUCOSEU, HGBUR, BILIRUBINUR, KETONESUR, PROTEINUR, UROBILINOGEN, NITRITE, LEUKOCYTESUR in the last 72 hours.  Invalid input(s): APPERANCEUR    Imaging: Dg Tibia/fibula Right  Result Date: 07/21/2018 CLINICAL DATA:  Patient with wound on the posterior right lower leg. History of peripheral vascular disease. EXAM: RIGHT TIBIA AND FIBULA - 2 VIEW COMPARISON:  Right lower extremity radiograph 05/30/2018 FINDINGS: Multiple skin staples are present. No radiopaque foreign body, soft tissue emphysema or osseous destruction. Degenerative changes of the knee. Vascular calcifications. Stent graft material visualized within the distal thigh. IMPRESSION: No radiographic evidence to suggest osteomyelitis. Electronically Signed   By: Lovey Newcomer M.D.   On: 07/21/2018 14:13   Dg Chest Portable 1 View  Result Date: 07/21/2018 CLINICAL DATA:  End-stage renal disease.  Hypertension. EXAM: PORTABLE CHEST 1 VIEW COMPARISON:  May 30, 2018 FINDINGS: Central catheter tips in superior vena cava. No pneumothorax. There is no evident edema or consolidation. Heart size and pulmonary vascularity are normal. No adenopathy. There is aortic atherosclerosis. There are stents in the left axillary brachial regions. No bone lesions. IMPRESSION: Central catheter as described without pneumothorax. No edema or consolidation. Stable cardiac silhouette. Aortic Atherosclerosis (ICD10-I70.0). Electronically Signed   By: Lowella Grip III M.D.   On: 07/21/2018 14:11     Medications:   . piperacillin-tazobactam (ZOSYN)  IV 3.375 g (07/22/18 0936)  . [START ON 07/23/2018] vancomycin     . allopurinol  100 mg Oral Daily  . amLODipine  10 mg Oral Daily  . apixaban  2.5 mg Oral BID  . atorvastatin  10 mg Oral Daily  . calcitRIOL  0.5 mcg Oral Daily  .  calcium acetate  1,334 mg Oral TID WC  . carvedilol  6.25 mg Oral QPM  . [START ON 07/23/2018] diphenhydrAMINE  50 mg Oral Once   Or  . [START ON 07/23/2018] diphenhydrAMINE  50 mg Intravenous Once  . docusate sodium  100 mg Oral BID  . furosemide  40 mg Oral Daily  . [START ON 07/23/2018] gabapentin  300 mg Oral QHS  . hydrALAZINE  25 mg Oral Daily  . irbesartan  150 mg Oral BID  . metoprolol succinate  25 mg Oral QHS  . [START ON 07/23/2018] predniSONE  50 mg Oral Q6H  . pregabalin  50 mg Oral Daily  . sevelamer carbonate  2,400 mg Oral TID WC   acetaminophen **OR** acetaminophen, bisacodyl,  colchicine, HYDROcodone-acetaminophen, ondansetron **OR** ondansetron (ZOFRAN) IV, traZODone  Assessment/ Plan:  68 y.o. African-American male  with end stage renal disease on hemodialysis, congestive heart failure, hypertension, peripheral vascular disease  [place]. MWF Rt IJ pC 65 kg, new AVG (Dr Lucky Cowboy 07/18/2018)  1. End-stage renal disease with mild hyperkalemia    2. Anemia of chronic kidney disease- EPO with HD 3. Secondary hyperparathyroidism- on binders 4.  Right lower extremity chronic wound  Plan for dialysis on Monday.  Patient's wound has been evaluated by vascular surgery and further angiogram versus other interventions will be considered.  Patient is currently on broad-spectrum antibiotic Zosyn and vancomycin.  Continue home dose of binders with meals.  We will continue Epogen with hemodialysis.  Further plan as hospital course progresses.   LOS: [place] 3/15/202011:29 AM  [place], [place]  Note: This note was prepared with Dragon dictation. Any transcription errors are unintentional

## 2018-07-23 ENCOUNTER — Encounter
Admission: EM | Disposition: A | Discharge status: Skilled Nursing Facility | Payer: Self-pay | Source: Home / Self Care | Attending: Internal Medicine

## 2018-07-23 ENCOUNTER — Other Ambulatory Visit (INDEPENDENT_AMBULATORY_CARE_PROVIDER_SITE_OTHER): Payer: Self-pay | Admitting: Vascular Surgery

## 2018-07-23 ENCOUNTER — Inpatient Hospital Stay: Payer: Medicare Other | Admitting: Anesthesiology

## 2018-07-23 ENCOUNTER — Encounter: Payer: Self-pay | Admitting: Anesthesiology

## 2018-07-23 DIAGNOSIS — T82856A Stenosis of peripheral vascular stent, initial encounter: Secondary | ICD-10-CM

## 2018-07-23 DIAGNOSIS — I743 Embolism and thrombosis of arteries of the lower extremities: Secondary | ICD-10-CM

## 2018-07-23 DIAGNOSIS — I70261 Atherosclerosis of native arteries of extremities with gangrene, right leg: Secondary | ICD-10-CM

## 2018-07-23 HISTORY — PX: LOWER EXTREMITY ANGIOGRAPHY: CATH118251

## 2018-07-23 LAB — RENAL FUNCTION PANEL
Albumin: 2.7 g/dL — ABNORMAL LOW (ref 3.5–5.0)
Anion gap: 18 — ABNORMAL HIGH (ref 5–15)
BUN: 85 mg/dL — ABNORMAL HIGH (ref 8–23)
CO2: 19 mmol/L — ABNORMAL LOW (ref 22–32)
Calcium: 9.8 mg/dL (ref 8.9–10.3)
Chloride: 103 mmol/L (ref 98–111)
Creatinine, Ser: 13.26 mg/dL — ABNORMAL HIGH (ref 0.61–1.24)
GFR calc Af Amer: 4 mL/min — ABNORMAL LOW (ref >60)
GFR calc non Af Amer: 3 mL/min — ABNORMAL LOW (ref >60)
GLUCOSE: 95 mg/dL (ref 70–99)
Phosphorus: 8 mg/dL — ABNORMAL HIGH (ref 2.5–4.6)
Potassium: 6.2 mmol/L — ABNORMAL HIGH (ref 3.5–5.1)
Sodium: 140 mmol/L (ref 135–145)

## 2018-07-23 LAB — CBC
HCT: 34.6 % — ABNORMAL LOW (ref 39.0–52.0)
Hemoglobin: 10.8 g/dL — ABNORMAL LOW (ref 13.0–17.0)
MCH: 30.1 pg (ref 26.0–34.0)
MCHC: 31.2 g/dL (ref 30.0–36.0)
MCV: 96.4 fL (ref 80.0–100.0)
Platelets: 364 10*3/uL (ref 150–400)
RBC: 3.59 MIL/uL — ABNORMAL LOW (ref 4.22–5.81)
RDW: 13.8 % (ref 11.5–15.5)
WBC: 18.8 10*3/uL — ABNORMAL HIGH (ref 4.0–10.5)
nRBC: 0 % (ref 0.0–0.2)

## 2018-07-23 LAB — GLUCOSE, CAPILLARY
GLUCOSE-CAPILLARY: 94 mg/dL (ref 70–99)
GLUCOSE-CAPILLARY: 134 mg/dL — AB (ref 70–99)
Glucose-Capillary: 111 mg/dL — ABNORMAL HIGH (ref 70–99)
Glucose-Capillary: 91 mg/dL (ref 70–99)
Glucose-Capillary: 162 mg/dL — ABNORMAL HIGH (ref 70–99)

## 2018-07-23 LAB — POTASSIUM (ARMC VASCULAR LAB ONLY): Potassium (ARMC vascular lab): 4.5 (ref 3.5–5.1)

## 2018-07-23 SURGERY — LOWER EXTREMITY ANGIOGRAPHY
Anesthesia: Moderate Sedation | Laterality: Right

## 2018-07-23 SURGERY — LOWER EXTREMITY ANGIOGRAPHY
Anesthesia: General | Laterality: Right

## 2018-07-23 MED ORDER — ONDANSETRON HCL 4 MG/2ML IJ SOLN
INTRAMUSCULAR | Status: AC
  Filled 2018-07-23: qty 2

## 2018-07-23 MED ORDER — TIROFIBAN (AGGRASTAT) BOLUS VIA INFUSION
25.0000 ug/kg | Freq: Once | INTRAVENOUS | Status: AC
  Administered 2018-07-23: 1593 ug via INTRAVENOUS
  Filled 2018-07-23: qty 32

## 2018-07-23 MED ORDER — FAMOTIDINE 20 MG PO TABS
ORAL_TABLET | ORAL | Status: AC
  Filled 2018-07-23: qty 1

## 2018-07-23 MED ORDER — DIPHENHYDRAMINE HCL 50 MG/ML IJ SOLN
50.0000 mg | Freq: Once | INTRAMUSCULAR | Status: AC | PRN
  Administered 2018-07-23: 50 mg via INTRAVENOUS

## 2018-07-23 MED ORDER — CEFAZOLIN SODIUM-DEXTROSE 2-4 GM/100ML-% IV SOLN
2.0000 g | INTRAVENOUS | Status: AC
  Administered 2018-07-23: 2 g via INTRAVENOUS

## 2018-07-23 MED ORDER — IRBESARTAN 150 MG PO TABS: 150.0000 mg | ORAL_TABLET | Freq: Two times a day (BID) | ORAL | Status: DC

## 2018-07-23 MED ORDER — DEXTROSE 5 % IV SOLN
INTRAVENOUS | Status: DC
  Administered 2018-07-23: 10:00:00 via INTRAVENOUS

## 2018-07-23 MED ORDER — HEPARIN SODIUM (PORCINE) 1000 UNIT/ML IJ SOLN
INTRAMUSCULAR | Status: DC | PRN
  Administered 2018-07-23: 5000 [IU] via INTRAVENOUS

## 2018-07-23 MED ORDER — SODIUM CHLORIDE 0.9 % IV SOLN: INTRAVENOUS | Status: DC

## 2018-07-23 MED ORDER — TIROFIBAN HCL IN NACL 5-0.9 MG/100ML-% IV SOLN
0.0750 ug/kg/min | INTRAVENOUS | Status: DC
  Administered 2018-07-24: 0.075 ug/kg/min via INTRAVENOUS
  Filled 2018-07-23 (×3): qty 100

## 2018-07-23 MED ORDER — APIXABAN 2.5 MG PO TABS
2.5000 mg | ORAL_TABLET | Freq: Two times a day (BID) | ORAL | Status: DC
  Administered 2018-07-24 – 2018-07-26 (×5): 2.5 mg via ORAL
  Filled 2018-07-23 (×5): qty 1

## 2018-07-23 MED ORDER — ONDANSETRON HCL 4 MG/2ML IJ SOLN
INTRAMUSCULAR | Status: DC | PRN
  Administered 2018-07-23: 4 mg via INTRAVENOUS

## 2018-07-23 MED ORDER — SODIUM CHLORIDE (PF) 0.9 % IJ SOLN
INTRAMUSCULAR | Status: AC
  Filled 2018-07-23: qty 50

## 2018-07-23 MED ORDER — METHYLPREDNISOLONE SODIUM SUCC 125 MG IJ SOLR
INTRAMUSCULAR | Status: AC
  Administered 2018-07-23: 125 mg via INTRAVENOUS
  Filled 2018-07-23: qty 2

## 2018-07-23 MED ORDER — LIDOCAINE HCL (PF) 2 % IJ SOLN
INTRAMUSCULAR | Status: AC
  Filled 2018-07-23: qty 10

## 2018-07-23 MED ORDER — METHYLPREDNISOLONE SODIUM SUCC 125 MG IJ SOLR
125.0000 mg | Freq: Once | INTRAMUSCULAR | Status: AC | PRN
  Administered 2018-07-23: 125 mg via INTRAVENOUS

## 2018-07-23 MED ORDER — DIPHENHYDRAMINE HCL 50 MG/ML IJ SOLN
INTRAMUSCULAR | Status: AC
  Administered 2018-07-23: 50 mg via INTRAVENOUS
  Filled 2018-07-23: qty 1

## 2018-07-23 MED ORDER — FENTANYL CITRATE (PF) 100 MCG/2ML IJ SOLN
INTRAMUSCULAR | Status: AC
  Filled 2018-07-23: qty 2

## 2018-07-23 MED ORDER — METHYLPREDNISOLONE SODIUM SUCC 40 MG IJ SOLR: 40.0000 mg | Freq: Once | INTRAMUSCULAR | Status: DC

## 2018-07-23 MED ORDER — ASPIRIN EC 81 MG PO TBEC
81.0000 mg | DELAYED_RELEASE_TABLET | Freq: Every day | ORAL | Status: DC
  Administered 2018-07-23 – 2018-07-26 (×4): 81 mg via ORAL
  Filled 2018-07-23 (×4): qty 1

## 2018-07-23 MED ORDER — ALTEPLASE 2 MG IJ SOLR
INTRAMUSCULAR | Status: DC | PRN
  Administered 2018-07-23: 6 mg

## 2018-07-23 MED ORDER — CEFAZOLIN SODIUM-DEXTROSE 2-4 GM/100ML-% IV SOLN
INTRAVENOUS | Status: AC
  Filled 2018-07-23: qty 100

## 2018-07-23 MED ORDER — NITROGLYCERIN 5 MG/ML IV SOLN
INTRAVENOUS | Status: AC
  Filled 2018-07-23: qty 10

## 2018-07-23 MED ORDER — PROPOFOL 10 MG/ML IV BOLUS
INTRAVENOUS | Status: DC | PRN
  Administered 2018-07-23: 110 mg via INTRAVENOUS

## 2018-07-23 MED ORDER — ONDANSETRON HCL 4 MG/2ML IJ SOLN
4.0000 mg | Freq: Four times a day (QID) | INTRAMUSCULAR | Status: DC | PRN
  Administered 2018-07-24: 4 mg via INTRAVENOUS
  Filled 2018-07-23: qty 2

## 2018-07-23 MED ORDER — CLOPIDOGREL BISULFATE 75 MG PO TABS
75.0000 mg | ORAL_TABLET | Freq: Every day | ORAL | Status: DC
  Administered 2018-07-23 – 2018-07-26 (×4): 75 mg via ORAL
  Filled 2018-07-23 (×4): qty 1

## 2018-07-23 MED ORDER — FENTANYL CITRATE (PF) 100 MCG/2ML IJ SOLN: 25.0000 ug | INTRAMUSCULAR | Status: DC | PRN

## 2018-07-23 MED ORDER — LIDOCAINE HCL (CARDIAC) PF 100 MG/5ML IV SOSY
PREFILLED_SYRINGE | INTRAVENOUS | Status: DC | PRN
  Administered 2018-07-23: 60 mg via INTRAVENOUS

## 2018-07-23 MED ORDER — FENTANYL CITRATE (PF) 100 MCG/2ML IJ SOLN
INTRAMUSCULAR | Status: DC | PRN
  Administered 2018-07-23 (×2): 25 ug via INTRAVENOUS

## 2018-07-23 MED ORDER — HYDROMORPHONE HCL 1 MG/ML IJ SOLN
1.0000 mg | Freq: Once | INTRAMUSCULAR | Status: AC | PRN
  Administered 2018-07-25: 1 mg via INTRAVENOUS
  Filled 2018-07-23: qty 1

## 2018-07-23 MED ORDER — MIDAZOLAM HCL 2 MG/ML PO SYRP: 8.0000 mg | ORAL_SOLUTION | Freq: Once | ORAL | Status: DC | PRN

## 2018-07-23 MED ORDER — FAMOTIDINE 20 MG PO TABS
40.0000 mg | ORAL_TABLET | Freq: Once | ORAL | Status: AC | PRN
  Administered 2018-07-23: 40 mg via ORAL
  Filled 2018-07-23: qty 2

## 2018-07-23 MED ORDER — LORAZEPAM 2 MG/ML IJ SOLN
0.5000 mg | INTRAMUSCULAR | Status: AC
  Administered 2018-07-23: 0.5 mg via INTRAVENOUS
  Filled 2018-07-23: qty 1

## 2018-07-23 MED ORDER — IOHEXOL 300 MG/ML  SOLN
INTRAMUSCULAR | Status: DC | PRN
  Administered 2018-07-23: 110 mL via INTRAVENOUS

## 2018-07-23 MED ORDER — HEPARIN SODIUM (PORCINE) 1000 UNIT/ML IJ SOLN
INTRAMUSCULAR | Status: AC
  Filled 2018-07-23: qty 1

## 2018-07-23 MED ORDER — SODIUM CHLORIDE 0.9 % IV SOLN
INTRAVENOUS | Status: DC | PRN
  Administered 2018-07-23: 23:00:00 via INTRAVENOUS

## 2018-07-23 MED ORDER — PROPOFOL 10 MG/ML IV BOLUS
INTRAVENOUS | Status: AC
  Filled 2018-07-23: qty 20

## 2018-07-23 SURGICAL SUPPLY — 26 items
BALLN DORADO 5X200X135 (BALLOONS) ×2
BALLN LUTONIX 018 5X300X130 (BALLOONS) ×2
BALLN LUTONIX 018 6X80X130 (BALLOONS) ×2
BALLN ULTRVRSE 2.5X300X150 (BALLOONS) ×2
BALLOON DORADO 5X200X135 (BALLOONS) ×1 IMPLANT
BALLOON LUTONIX 018 5X300X130 (BALLOONS) ×1 IMPLANT
BALLOON LUTONIX 018 6X80X130 (BALLOONS) ×1 IMPLANT
BALLOON ULTRVRSE 2.5X300X150 (BALLOONS) ×1 IMPLANT
CANISTER PENUMBRA ENGINE (MISCELLANEOUS) ×2 IMPLANT
CATH BEACON 5 .038 100 VERT TP (CATHETERS) ×2 IMPLANT
CATH INDIGO CAT6 KIT (CATHETERS) ×2 IMPLANT
CATH PIG 70CM (CATHETERS) ×2 IMPLANT
COVER PROBE U/S 5X48 (MISCELLANEOUS) ×2 IMPLANT
DEVICE PRESTO INFLATION (MISCELLANEOUS) ×2 IMPLANT
DEVICE STARCLOSE SE CLOSURE (Vascular Products) ×2 IMPLANT
GLIDEWIRE ADV .035X260CM (WIRE) ×2 IMPLANT
PACK ANGIOGRAPHY (CUSTOM PROCEDURE TRAY) ×2 IMPLANT
SHEATH ANL2 6FRX45 HC (SHEATH) ×2 IMPLANT
SHEATH BRITE TIP 5FRX11 (SHEATH) ×2 IMPLANT
SHEATH PINNACLE ST 6F 45CM (SHEATH) ×2 IMPLANT
STENT VIABAHN 6X250X120 (Permanent Stent) ×2 IMPLANT
SYR MEDRAD MARK 7 150ML (SYRINGE) ×2 IMPLANT
TOWEL OR 17X26 4PK STRL BLUE (TOWEL DISPOSABLE) ×2 IMPLANT
TUBING CONTRAST HIGH PRESS 72 (TUBING) ×2 IMPLANT
WIRE G V18X300CM (WIRE) ×4 IMPLANT
WIRE J 3MM .035X145CM (WIRE) ×2 IMPLANT

## 2018-07-23 NOTE — Anesthesia Preprocedure Evaluation (Addendum)
Anesthesia Evaluation  Patient identified by MRN, date of birth, ID band Patient awake    Reviewed: Allergy & Precautions, NPO status , Patient's Chart, lab work & pertinent test results  History of Anesthesia Complications Negative for: history of anesthetic complications  Airway Mallampati: III  TM Distance: >3 FB Neck ROM: Full    Dental   Pulmonary neg sleep apnea, neg COPD, former smoker,    breath sounds clear to auscultation- rhonchi (-) wheezing      Cardiovascular hypertension, Pt. on medications + Past MI, + Peripheral Vascular Disease and +CHF  (-) Cardiac Stents and (-) CABG  Rhythm:Regular Rate:Normal - Systolic murmurs and - Diastolic murmurs Echo 4/69/62: - Left ventricle: The cavity size was normal. Wall thickness was   increased in a pattern of severe LVH. Systolic function was   hyperdynamic. The estimated ejection fraction was in the range of   70% to 75%. Wall motion was normal; there were no regional wall   motion abnormalities. Doppler parameters are consistent with   abnormal left ventricular relaxation (grade 1 diastolic   dysfunction). Doppler parameters are consistent with high   ventricular filling pressure. - Mitral valve: Calcified annulus. - Right ventricle: The cavity size was normal. Systolic function   was normal. - Pericardium, extracardiac: A small pericardial effusion was   identified anterior to the heart.   Neuro/Psych neg Seizures PSYCHIATRIC DISORDERS Anxiety    GI/Hepatic negative GI ROS, Neg liver ROS,   Endo/Other  diabetes  Renal/GU ESRF and DialysisRenal disease     Musculoskeletal negative musculoskeletal ROS (+)   Abdominal (+) - obese,   Peds  Hematology  (+) Blood dyscrasia, anemia ,   Anesthesia Other Findings Past Medical History: No date: Anemia No date: Anxiety No date: CHF (congestive heart failure) (HCC) No date: Chronic kidney disease     Comment:  esrd  dialysis m/w/f No date: Gout No date: Hyperlipidemia No date: Hypertension 2010: Myocardial infarction [facility])     Comment:  10 years ago 2020: Neuromuscular disorder ([place])     Comment:  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 No date: Peripheral vascular disease ([place])   Reproductive/Obstetrics                           Anesthesia Physical  Anesthesia Plan  ASA: IV  Anesthesia Plan: General   Post-op Pain Management:    Induction: Intravenous  PONV Risk Score and Plan: Ondansetron  Airway Management Planned: LMA  Additional Equipment:   Intra-op Plan:   Post-operative Plan:   Informed Consent: I have reviewed the patients History and Physical, chart, labs and discussed the procedure including the risks, benefits and alternatives for the proposed anesthesia with the patient or authorized representative who has indicated his/her understanding and acceptance.     Dental advisory given  Plan Discussed with: CRNA and Anesthesiologist  Anesthesia Plan Comments:        Anesthesia Quick Evaluation

## 2018-07-23 NOTE — Progress Notes (Signed)
Pre HD Tx   07/23/18 0950  Hand-Off documentation  Report given to (Full Name) Trellis Paganini RN  Report received from (Full Name) Jordan Hawks RN   Vital Signs  Pulse Rate 85  Pulse Rate Source Monitor  Resp 16  BP 131/62  BP Location Left Arm  BP Method Automatic  Patient Position (if appropriate) Sitting  Oxygen Therapy  SpO2 98 %  O2 Device Room Air  Pain Assessment  Pain Scale 0-10  Pain Score 8  Pain Type Acute pain;Surgical pain  Pain Location Arm  Pain Orientation Right  Pain Descriptors / Indicators Aching  Dialysis Weight  Weight 64.3 kg  Type of Weight Pre-Dialysis  Time-Out for Hemodialysis  What Procedure? Hemodialysis   Pt Identifiers(min of two) First/Last Name;MRN/Account#  Correct Site? Yes  Correct Side? Yes  Correct Procedure? Yes  Consents Verified? Yes  Rad Studies Available? N/A  Safety Precautions Reviewed? Yes  Engineer, civil (consulting) Number 2  Station Number 1  UF/Alarm Test Passed  Conductivity: Meter 14  Conductivity: Machine  14  pH 7.2  Reverse Osmosis main  Normal Saline Lot Number [id number]  Dialyzer Lot Number [id number]  Disposable Set Lot Number [id number]  Machine Temperature 98.6 F (37 C)  Musician and Audible Yes  Blood Lines Intact and Secured Yes  Pre Treatment Patient Checks  Vascular access used during treatment Catheter  Hepatitis B Surface Antigen Results Negative  Date Hepatitis B Surface Antigen Drawn 01/29/18  Hepatitis B Surface Antibody  (>10)  Date Hepatitis B Surface Antibody Drawn 01/29/18  Hemodialysis Consent Verified Yes  Hemodialysis Standing Orders Initiated Yes  ECG (Telemetry) Monitor On Yes  Prime Ordered Normal Saline  Length of  DialysisTreatment -hour(s) 3.5 Hour(s)  Dialysis Treatment Comments Na 140  Dialyzer Elisio 17H NR  Dialysate 2K, 2.5 Ca  Dialysate Flow Ordered 800  Blood Flow Rate Ordered 400 mL/min  Pre Treatment Labs Renal panel;CBC  Dialysis Blood Pressure Support  Ordered Normal Saline  Education / Care Plan  Dialysis Education Provided Yes  Documented Education in Care Plan Yes  Fistula / Graft Right Other (Comment) Arteriovenous vein graft  Placement Date/Time: 07/19/18 1324   Placed prior to admission: No  Orientation: Right  Access Location: (c) Other (Comment)  Access Type: Arteriovenous vein graft  Site Condition No complications  Fistula / Graft Assessment Present;Thrill;Bruit (Noted swelling, angio after HD)  Status  (USED HD PERM CATH FOR DIALYSIS TX)  Drainage Description None

## 2018-07-23 NOTE — Progress Notes (Deleted)
Post HD Tx   07/23/18 1300  Fistula / Graft Right Other (Comment) Arteriovenous vein graft  Placement Date/Time: 07/19/18 1324   Placed prior to admission: No  Orientation: Right  Access Location: (c) Other (Comment)  Access Type: Arteriovenous vein graft  Site Condition No complications  Fistula / Graft Assessment Present;Thrill;Bruit  Status Deaccessed

## 2018-07-23 NOTE — Progress Notes (Signed)
HD Tx End  Tx ended early per MD / Vascular Surgery. Pt to specials for angio. Stable, transporting with 3LO2 d/t desat during tx. Sustaining with 3L above 92 when mouth is closed / educated to breath in through nose.     07/23/18 1245  Hand-Off documentation  Report given to (Full Name) Jordan Hawks RN  Report received from (Full Name) Trellis Paganini RN  Vital Signs  Temp 98 F (36.7 C)  Temp Source Oral  Pulse Rate 87  Pulse Rate Source Monitor  Resp 18  BP (!) 157/80  BP Location Left Arm  BP Method Automatic  Patient Position (if appropriate) Lying  Oxygen Therapy  SpO2 96 %  O2 Device Nasal Cannula  O2 Flow Rate (L/min) 3 L/min  Dialysis Weight  Weight 63.7 kg  Type of Weight Post-Dialysis  During Hemodialysis Assessment  Blood Flow Rate (mL/min) 200 mL/min  Arterial Pressure (mmHg) -100 mmHg  Venous Pressure (mmHg) 120 mmHg  Transmembrane Pressure (mmHg) 40 mmHg  Ultrafiltration Rate (mL/min) 0 mL/min  Dialysate Flow Rate (mL/min) 800 ml/min  Conductivity: Machine  14  HD Safety Checks Performed Yes  KECN 60.2 KECN  Dialysis Fluid Bolus Normal Saline  Bolus Amount (mL) 250 mL  Intra-Hemodialysis Comments Tx completed (3L O2, transported with tank to specials)

## 2018-07-23 NOTE — Progress Notes (Signed)
Pre HD Tx Assessment   07/23/18 0950  Neurological  Level of Consciousness Alert  Orientation Level Oriented X4  Respiratory  Respiratory Pattern Regular  Chest Assessment Chest expansion symmetrical  Bilateral Breath Sounds Diminished  Cardiac  Pulse Regular  ECG Monitor Yes  Cardiac Rhythm NSR  Vascular  R Radial Pulse +1  L Radial Pulse +2  Integumentary  Integumentary (WDL) X  Skin Color Appropriate for ethnicity  Skin Condition Dry  Skin Integrity Surgical Incision (see LDA);Cellulitis  Cellulitis Location Leg  Cellulitis Location Orientation Right;Lower;Posterior  Additional Integumentary Comments  (HD pt)  Musculoskeletal  Musculoskeletal (WDL) X  Generalized Weakness Yes  Psychosocial  Psychosocial (WDL) WDL

## 2018-07-23 NOTE — Anesthesia Postprocedure Evaluation (Signed)
Anesthesia Post Note  Patient: Johnny [name].  Procedure(s) Performed: INSERTION OF ARTERIOVENOUS (AV) GORE-TEX GRAFT ARM ( BRACHIAL AXILLARY) (Right )  Patient location during evaluation: PACU Anesthesia Type: General Level of consciousness: awake and alert and oriented Pain management: pain level controlled Vital Signs Assessment: post-procedure vital signs reviewed and stable Respiratory status: spontaneous breathing, nonlabored ventilation and respiratory function stable Cardiovascular status: blood pressure returned to baseline and stable Postop Assessment: no signs of nausea or vomiting Anesthetic complications: no     Last Vitals:  Vitals:   07/19/18 1554 07/19/18 1639  BP: (!) 171/64 104/74  Pulse: 89 93  Resp: 16 16  Temp: 36.9 C   SpO2: 97% 100%    Last Pain:  Vitals:   07/20/18 0858  TempSrc:   PainSc: 7                  Toshika Parrow

## 2018-07-23 NOTE — Progress Notes (Signed)
Pt came back from procedure around 17:00 confused. Pt refused me to touch him and assess his leg. He was agitated. Pt wanting to go home. Pt" you are  holding me hostage,I will call police on you. I have money, I will pay for a cab. I  do this all the time".Primary nurse educated pt .about situation. Patient insisted.Md was notified and ordered to give ativan.

## 2018-07-23 NOTE — Progress Notes (Signed)
[facility], [place] 07/23/18  Subjective:  Patient seen and evaluated during hemodialysis. Tolerating well. UF turned off as blood pressure a bit low.   Objective:  Vital signs in last 24 hours:  Temp:  [97.8 F (36.6 C)-98.8 F (37.1 C)] 98.8 F (37.1 C) (03/16 0950) Pulse Rate:  [84-97] 97 (03/16 1145) Resp:  [12-20] 20 (03/16 1145) BP: (76-174)/(45-73) 90/57 (03/16 1145) SpO2:  [96 %-100 %] 96 % (03/16 1145) Weight:  [64.3 kg] 64.3 kg (03/16 0950)  Weight change: -1.7 kg Filed Weights   07/21/18 1320 07/23/18 0611 07/23/18 0950  Weight: 66 kg 64.3 kg 64.3 kg    Intake/Output:    Intake/Output Summary (Last 24 hours) at 07/23/2018 1153 Last data filed at 07/23/2018 0973 Gross per 24 hour  Intake 208.16 ml  Output 0 ml  Net 208.16 ml     Physical Exam: General:  Chronically ill-appearing, laying in the bed  HEENT  anicteric, moist oral mucous membranes  Neck  supple  Pulm/lungs  normal breathing effort, clear to auscultation  CVS/Heart  no rub  Abdomen:   Soft, nontender  Extremities:  Right arm edema around the new AV graft  Neurologic:  Alert, follows some commands, confused  Skin:  Foul-smelling wound in the right leg and mid shin area  Access:  Right IJ PermCath, right upper arm AV graft-newly placed       Basic Metabolic Panel:  Recent Labs  Lab 07/17/18 1412 07/19/18 1058 07/21/18 1343 07/22/18 0457 07/22/18 1838  NA 140 142 139 140 141  K 4.1 3.4* 5.2* 5.2* 5.0  CL 100  --  103 102 105  CO2 24  --  19* 20* 20*  GLUCOSE 87 64* 88 98 107*  BUN 34*  --  59* 69* 75*  CREATININE 7.11*  --  10.04* 10.99* 12.14*  CALCIUM 9.7  --  10.1 9.9 9.4     CBC: Recent Labs  Lab 07/17/18 1412 07/19/18 1058 07/21/18 1343 07/22/18 0457  WBC 12.2*  --  17.9* 15.8*  NEUTROABS 9.3*  --  14.4*  --   HGB 11.9* 9.5* 10.8* 10.2*  HCT 38.4* 28.0* 34.4* 33.7*  MCV 95.8  --  95.6 97.7  PLT 274  --  283 283      Lab Results   Component Value Date   HEPBSAG Negative 09/30/2015   HEPBSAB Reactive 01/29/2018      Microbiology:  Recent Results (from the past 240 hour(s))  Surgical pcr screen     Status: Abnormal   Collection Time: 07/17/18  2:49 PM  Result Value Ref Range Status   MRSA, PCR NEGATIVE NEGATIVE Final   Staphylococcus aureus POSITIVE (A) NEGATIVE Final    Comment: (NOTE) The Xpert SA Assay (FDA approved for NASAL specimens in patients 44 years of age and older), is one component of a comprehensive surveillance program. It is not intended to diagnose infection nor to guide or monitor treatment. Performed at [facility], [address] [place], [place] [address]   Wound or Superficial Culture     Status: None (Preliminary result)   Collection Time: 07/21/18  2:40 PM  Result Value Ref Range Status   Specimen Description   Final    WOUND Performed at [facility], [address], [place], [place] [address]    Special Requests   Final    NONE Performed at [facility], [address], [place], [place] [address]    Gram  Stain   Final    FEW WBC PRESENT, PREDOMINANTLY PMN ABUNDANT GRAM NEGATIVE RODS ABUNDANT GRAM POSITIVE COCCI IN PAIRS IN CLUSTERS Performed at [facility], [place] [address] [place], [place] [address]    Culture ABUNDANT GRAM NEGATIVE RODS  Final   Report Status PENDING  Incomplete  Blood culture (single)     Status: None (Preliminary result)   Collection Time: 07/21/18  5:35 PM  Result Value Ref Range Status   Specimen Description BLOOD LFOA  Final   Special Requests   Final    BOTTLES DRAWN AEROBIC AND ANAEROBIC Blood Culture adequate volume   Culture   Final    NO GROWTH 2 DAYS Performed at [facility], [address] [place],  [address]    Report Status PENDING  Incomplete    Coagulation Studies: No results for input(s): LABPROT, INR in the last 72 hours.  Urinalysis: No results for input(s):  COLORURINE, LABSPEC, PHURINE, GLUCOSEU, HGBUR, BILIRUBINUR, KETONESUR, PROTEINUR, UROBILINOGEN, NITRITE, LEUKOCYTESUR in the last 72 hours.  Invalid input(s): APPERANCEUR    Imaging: Dg Tibia/fibula Right  Result Date: 07/21/2018 CLINICAL DATA:  Patient with wound on the posterior right lower leg. History of peripheral vascular disease. EXAM: RIGHT TIBIA AND FIBULA - 2 VIEW COMPARISON:  Right lower extremity radiograph 05/30/2018 FINDINGS: Multiple skin staples are present. No radiopaque foreign body, soft tissue emphysema or osseous destruction. Degenerative changes of the knee. Vascular calcifications. Stent graft material visualized within the distal thigh. IMPRESSION: No radiographic evidence to suggest osteomyelitis. Electronically Signed   By: Lovey Newcomer M.D.   On: 07/21/2018 14:13   Dg Chest Portable 1 View  Result Date: 07/21/2018 CLINICAL DATA:  End-stage renal disease.  Hypertension. EXAM: PORTABLE CHEST 1 VIEW COMPARISON:  May 30, 2018 FINDINGS: Central catheter tips in superior vena cava. No pneumothorax. There is no evident edema or consolidation. Heart size and pulmonary vascularity are normal. No adenopathy. There is aortic atherosclerosis. There are stents in the left axillary brachial regions. No bone lesions. IMPRESSION: Central catheter as described without pneumothorax. No edema or consolidation. Stable cardiac silhouette. Aortic Atherosclerosis (ICD10-I70.0). Electronically Signed   By: Lowella Grip III M.D.   On: 07/21/2018 14:11     Medications:   . sodium chloride 20 mL/hr at 07/22/18 1804  .  ceFAZolin (ANCEF) IV    . dextrose 50 mL/hr at 07/23/18 0933  . piperacillin-tazobactam (ZOSYN)  IV Stopped (07/22/18 2330)  . vancomycin     . allopurinol  100 mg Oral Daily  . amLODipine  10 mg Oral Daily  . apixaban  2.5 mg Oral BID  . atorvastatin  10 mg Oral Daily  . calcitRIOL  0.5 mcg Oral Daily  . calcium acetate  1,334 mg Oral TID WC  . carvedilol  6.25  mg Oral QPM  . Chlorhexidine Gluconate Cloth  6 each Topical Q0600  . diphenhydrAMINE  50 mg Oral Once   Or  . diphenhydrAMINE  50 mg Intravenous Once  . docusate sodium  100 mg Oral BID  . furosemide  40 mg Oral Daily  . gabapentin  300 mg Oral QHS  . hydrALAZINE  25 mg Oral Daily  . irbesartan  150 mg Oral BID  . methylPREDNISolone (SOLU-MEDROL) injection  40 mg Intravenous Once  . metoprolol succinate  25 mg Oral QHS  . predniSONE  50 mg Oral Q6H  . pregabalin  50 mg Oral Daily  . sevelamer carbonate  2,400 mg Oral TID  WC   acetaminophen **OR** acetaminophen, bisacodyl, colchicine, diphenhydrAMINE, famotidine, HYDROcodone-acetaminophen, methylPREDNISolone (SOLU-MEDROL) injection, ondansetron **OR** ondansetron (ZOFRAN) IV, traZODone  Assessment/ Plan:  68 y.o. African-American male  with end stage renal disease on hemodialysis, congestive heart failure, hypertension, peripheral vascular disease  [place]. MWF Rt IJ pC 65 kg, new AVG (Dr Lucky Cowboy 07/18/2018)  1. End-stage renal disease with mild hyperkalemia    2. Anemia of chronic kidney disease- EPO with HD 3. Secondary hyperparathyroidism- on binders 4.  Right lower extremity chronic wound  Plan: Patient seen and evaluated during hemodialysis.  Ultrafiltration placed on hold at the moment given hypotension.  Continue to monitor vital signs during dialysis treatment.  Hemoglobin currently 10.2.  Hold off on Epogen for now.  Maintain the patient on Renvela 3 tablets p.o. 3 times daily with meals otherwise.    LOS: 2 Charliee Krenz 3/16/202011:53 AM  [place], [place]  Note: This note was prepared with Dragon dictation. Any transcription errors are unintentional

## 2018-07-23 NOTE — Progress Notes (Signed)
Post HD Tx   07/23/18 1259  Vital Signs  Pulse Rate 86  Resp 18  BP (!) 162/72  Oxygen Therapy  SpO2 98 %  Post-Hemodialysis Assessment  Rinseback Volume (mL) 250 mL  KECN 60.2 V  Dialyzer Clearance Lightly streaked  Duration of HD Treatment -hour(s) 3.25 hour(s)  Hemodialysis Intake (mL) 500 mL  UF Total -Machine (mL) 1083 mL  Net UF (mL) 583 mL  Post-Hemodialysis Comments  (transported directly to specials for angio)  Fistula / Graft Right Other (Comment) Arteriovenous vein graft  Placement Date/Time: 07/19/18 1324   Placed prior to admission: No  Orientation: Right  Access Location: (c) Other (Comment)  Access Type: Arteriovenous vein graft  Site Condition No complications  Fistula / Graft Assessment Present;Thrill;Bruit  Drainage Description None  Hemodialysis Catheter Right Subclavian Double-lumen  Placement Date/Time: (c) (c)   Placed prior to admission: No  Orientation: Right  Access Location: Subclavian  Hemodialysis Catheter Type: Double-lumen  Site Condition No complications  Blue Lumen Status Flushed;Saline locked;Capped (Central line);Heparin locked  Red Lumen Status Flushed;Saline locked;Capped (Central line);Heparin locked  Purple Lumen Status N/A  Catheter fill solution Heparin 1000 units/ml  Catheter fill volume (Arterial) 1.5 cc  Catheter fill volume (Venous) 1.5  Dressing Type Biopatch  Dressing Status Clean;Dry;Intact  Interventions New dressing;Dressing changed  Drainage Description None  Post treatment catheter status Capped and Clamped

## 2018-07-23 NOTE — Progress Notes (Signed)
Post HD Tx Assessment  Tx ended early per MD / Vascular Surgery. Pt to specials for angio. Stable, transporting with 3LO2 d/t desat during tx. Sustaining with 3L above 92 when mouth is closed / educated to breath in through nose.      07/23/18 1255  Neurological  Level of Consciousness Alert  Orientation Level Oriented X4 (somewhat forgetful)  Respiratory  Respiratory Pattern Regular  Chest Assessment Chest expansion symmetrical  Bilateral Breath Sounds Diminished;Clear  Cardiac  Pulse Regular  ECG Monitor Yes  Cardiac Rhythm NSR  Vascular  R Radial Pulse +1  L Radial Pulse +2  Integumentary  Integumentary (WDL) X  Skin Color Appropriate for ethnicity  Skin Condition Dry  Skin Integrity Surgical Incision (see LDA);Cellulitis  Cellulitis Location Leg  Cellulitis Location Orientation Right;Lower;Posterior  Musculoskeletal  Musculoskeletal (WDL) X  Generalized Weakness Yes  Psychosocial  Psychosocial (WDL) WDL

## 2018-07-23 NOTE — Progress Notes (Signed)
Pharmacist, Corene Cornea consulted regarding Aggrastat, ASA and Plavix; confirmed that Dr. Lucky Cowboy wanted patient to receive all of these and will begin Elaquis tomorrow. Acknowledged; Barbaraann Faster, RN 8:52 PM 07/23/2018

## 2018-07-23 NOTE — Progress Notes (Signed)
Report given to [facility] in specials about pt's prep for CT allergy. Pt is supposed to have solumedrol and benadyrl after dialysis but pt. went straight to specials. Staff in specials made aware.

## 2018-07-23 NOTE — Op Note (Addendum)
[facility]  Percutaneous Study/Intervention Procedural Note   Date of Surgery:  07/23/2018  Surgeon(s):DEW,JASON    Assistants:none  Pre-operative Diagnosis: PAD with gangrenous changes to the right leg  Post-operative diagnosis:  Same  Procedure(s) Performed:             1.  Ultrasound guidance for vascular access left femoral artery             2.  Catheter placement into right common femoral artery from left femoral approach             3.  Aortogram and selective right lower extremity angiogram including selective images of the right posterior tibial and anterior tibial arteries             4.  Percutaneous transluminal angioplasty of right posterior tibial artery and tibioperoneal trunk with 2.5 mm diameter by 30 cm length angioplasty balloon             5.   Percutaneous transluminal angioplasty of the right popliteal artery and distal and mid SFA with 5 mm diameter by 30 cm length Lutonix drug-coated angioplasty balloon  6.  Percutaneous transluminal angioplasty of the proximal SFA with 6 mm diameter by 8 cm length Lutonix drug-coated angioplasty balloon  7.  Catheter directed thrombolytic therapy with 6 mg of TPA to the right SFA, popliteal artery, and posterior tibial arteries  8.  Mechanical thrombectomy with the penumbra cat 6 device to the right SFA, popliteal artery, tibioperoneal trunk, and posterior tibial arteries  9.  Viabahn stent placement to the mid to distal SFA and popliteal artery with 6 mm diameter by 25 cm length stent  10.  Percutaneous transluminal angioplasty of the right anterior tibial artery with 2.5 mm diameter by 30 cm length angioplasty balloon             11.  StarClose closure device left femoral artery  EBL: 150 cc  Contrast: 110 cc  Fluoro Time: 23.2 minutes  Anesthesia: General              Indications:  Patient is a 68 y.o.male with recent gangrenous changes to a wound on his lower extremity.  This has started in the  last week and has dramatically worsened.  He has a previous history of severe peripheral arterial disease and had intervention several months ago.  He underwent an elective AV graft in his arm last week and is possible with hypotension or surgery that his lower extremity interventions have thrombosed. The quality of her noninvasive studies at our hospital is extremely poor so no noninvasive studies are available prior to angiogram.  The angiogram would be diagnostic and then potentially therapeutic. The patient is brought in for angiography for further evaluation and potential treatment.  Due to the limb threatening nature of the situation, angiogram was performed for attempted limb salvage. The patient is aware that if the procedure fails, amputation would be expected.  The patient also understands that even with successful revascularization, amputation may still be required due to the severity of the situation.  Risks and benefits are discussed and informed consent is obtained.   Procedure:  The patient was identified and appropriate procedural time out was performed.  The patient was then placed supine on the table and prepped and draped in the usual sterile fashion.   Anesthesia provided general anesthesia.  Ultrasound was used to evaluate the left common femoral artery.  It was patent .  A digital ultrasound  image was acquired.  A Seldinger needle was used to access the left common femoral artery under direct ultrasound guidance and a permanent image was performed.  A 0.035 J wire was advanced without resistance and a 5Fr sheath was placed.  Pigtail catheter was placed into the aorta and an AP aortogram was performed. This demonstrated sluggish flow but no obvious stenosis of the renal arteries and normal aorta and iliac segments without significant stenosis. I then crossed the aortic bifurcation and advanced to the right femoral head. Selective right lower extremity angiogram was then performed. This  demonstrated very calcific vessels with stenosis of the profunda femoris artery greater than 70%.  The SFA had about a 60-70% stenosis proximally.  The vessel then normalized until the mid SFA where it was occluded above the previously placed stent.  On initial imaging, absolutely no flow seen distally occlusion of all 3 tibial vessels.  It was felt that it was in the patient's best interest to proceed with intervention after these images to avoid a second procedure and a larger amount of contrast and fluoroscopy based off of the findings from the initial angiogram. The patient was systemically heparinized and a 6 [place] Ansell sheath was then placed over the [employer] wire. I then used a Kumpe catheter and the advantage wire to navigate through the occlusion wire went down and the posterior tibial artery.  Selective imaging showed the posterior tibial artery which was extremely diseased with multiple areas of occlusion and possibly some thrombosis as well.  I exchanged for a 0.018 wire.  A 2.5 mm diameter by 30 cm length angioplasty balloon was then inflated from the ankle up to the origin of the tibioperoneal trunk and taken to 10 atm for 1 minute.  I then took a 5 mm diameter by 30 cm length Lutonix drug-coated angioplasty balloon and treated from the distal popliteal artery up through the previously placed stent which was occluded and into the mid SFA.  This was inflated to 10 atm for 1 minute imaging following this showed sluggish flow with clear thrombus present at this point.  I instilled 6 mg of TPA in the right SFA, popliteal artery, and down to the posterior tibial artery and exchanged back for an 035 wire and placed a 6 [place] destination sheath.  I then re-exchanged for a 0.018 wire and used the penumbra cat 6 device to perform mechanical thrombectomy throughout the right SFA, popliteal artery, tibioperoneal trunk, and posterior tibial arteries.  Several passes were made with approximately 100 to  150 cc of effluent returned and some thrombus removed.  There was now a channel on imaging but clear residual thrombus and high-grade stenosis at the proximal edge of the previously placed stent as well as in the popliteal artery just below the previously placed stent.  A 6 mm diameter by 25 cm length Viabahn stent was then deployed from the mid popliteal artery up to the mid SFA and postdilated with a 5 mm balloon.  I also used a 6 mm diameter by 8 cm length Lutonix drug-coated angioplasty balloon to treat the proximal SFA stenosis.  This was inflated to 12 atm for 1 minute.  A 6 mm balloon was also advanced to an area at the mid popliteal artery where there was residual constraint after a 5 mm balloon and inflated to 14 atm there.  Completion imaging showed only about a 20% residual stenosis in the proximal SFA, a less than 20% residual stenosis in the mid  to distal SFA and popliteal arteries, but still very sluggish flow distally.  This did uncover what appeared to be a nearly occlusive stenosis near the origin of the anterior tibial artery as well as moderate stenosis in the mid anterior tibial artery in the 70% range.  The peroneal artery was continuous.  The posterior tibial artery was not continuous to the foot but did not have obvious focal stenosis and it cleared to have significant vasospasm distally.  I then readvanced a V 18 wire with help of the Kumpe catheter across the anterior tibial artery stenoses and confirm intraluminal flow in the mid anterior tibial artery with a selective injection.  The wire was then replaced and the catheter was removed and angioplasty was performed with a 2.5 mm diameter by 30 cm length angioplasty balloon from the mid to distal anterior tibial artery up through the proximal anterior tibial artery and into the popliteal artery.  This was taken to 16 atm for 1 minute.  Completion imaging showed only about a 20% residual stenosis but there remained sluggish flow distally.  At  this point, disadvantaged runoff was all that really remained and I will place him on an Aggrastat drip overnight with no other real option for revascularization.  If his perfusion remains disadvantaged and his wound require amputation. I elected to terminate the procedure. The sheath was removed and StarClose closure device was deployed in the left femoral artery with excellent hemostatic result. The patient was taken to the recovery room in stable condition having tolerated the procedure well.  Findings:               Aortogram:  sluggish renal artery flow but no obvious renal artery stenosis, no aorta or iliac artery stenosis.             Right lower Extremity:  very calcific vessels with stenosis of the profunda femoris artery greater than 70%.  The SFA had about a 60-70% stenosis proximally.  The vessel then normalized until the mid SFA where it was occluded above the previously placed stent.  On initial imaging, absolutely no flow seen distally occlusion of all 3 tibial vessels.   Disposition: Patient was taken to the recovery room in stable condition having tolerated the procedure well.  Complications: None  Leotis Pain 07/23/2018 3:56 PM   This note was created with Dragon Medical transcription system. Any errors in dictation are purely unintentional.

## 2018-07-23 NOTE — Anesthesia Procedure Notes (Signed)
Procedure Name: LMA Insertion Date/Time: 07/23/2018 2:11 PM Performed by: Jonna Clark, CRNA Pre-anesthesia Checklist: Patient identified, Patient being monitored, Timeout performed, Emergency Drugs available and Suction available Patient Re-evaluated:Patient Re-evaluated prior to induction Oxygen Delivery Method: Circle system utilized Preoxygenation: Pre-oxygenation with 100% oxygen Induction Type: IV induction Ventilation: Mask ventilation without difficulty LMA: LMA inserted LMA Size: 4.0 Tube type: Oral Number of attempts: 1 Placement Confirmation: positive ETCO2 and breath sounds checked- equal and bilateral Tube secured with: Tape Dental Injury: Teeth and Oropharynx as per pre-operative assessment

## 2018-07-23 NOTE — Anesthesia Post-op Follow-up Note (Signed)
Anesthesia QCDR form completed.        

## 2018-07-23 NOTE — Progress Notes (Signed)
PT Cancellation Note  Patient Details Name: [name]. MRN: [id number] DOB: 1951/01/17   Cancelled Treatment:    Reason Eval/Treat Not Completed: Patient at procedure or test/unavailable(Consult received and chart reviewed.  Patient currently off unit for procedure. Will re-attempt at later time/date as medically appropriate and available.)   Juda Lajeunesse H. Owens Shark, PT, DPT, NCS 07/23/18, 2:10 PM [phone]

## 2018-07-23 NOTE — Progress Notes (Signed)
[facility] [place] NAME: Johnny [name]    MR#:  [id number]  DATE OF BIRTH:  [birth year]  SUBJECTIVE:   Patient plan for angiogram today.  Going for dialysis today.  Patient is hungry.  REVIEW OF SYSTEMS:    Review of Systems  Constitutional: Negative for fever, chills weight loss HENT: Negative for ear pain, nosebleeds, congestion, facial swelling, rhinorrhea, neck pain, neck stiffness and ear discharge.   Respiratory: Negative for cough, shortness of breath, wheezing  Cardiovascular: Negative for chest pain, palpitations and leg swelling.  Gastrointestinal: Negative for heartburn, abdominal pain, vomiting, diarrhea or consitpation Genitourinary: Negative for dysuria, urgency, frequency, hematuria Musculoskeletal: Negative for back pain or joint pain Neurological: Negative for dizziness, seizures, syncope, focal weakness,  numbness and headaches.  Hematological: Does not bruise/bleed easily.  Psychiatric/Behavioral: Negative for hallucinations, confusion, dysphoric mood  SKIN: lower right leg ulcer foul smelling  Tolerating Diet: npo      DRUG ALLERGIES:   Allergies  Allergen Reactions  . Shellfish Allergy Anaphylaxis    VITALS:  Blood pressure (!) 142/71, pulse 87, temperature 98.8 F (37.1 C), temperature source Oral, resp. rate 17, height 5\' 8"  (1.727 m), weight 64.3 kg, SpO2 99 %.  PHYSICAL EXAMINATION:  Constitutional: Appears well-developed and well-nourished. No distress. HENT: Normocephalic. [name] Oropharynx is clear and moist.  Eyes: Conjunctivae and EOM are normal. PERRLA, no scleral icterus.  Neck: Normal ROM. Neck supple. No JVD. No tracheal deviation. CVS: RRR, S1/S2 +, no murmurs, no gallops, no carotid bruit.  Pulmonary: Effort and breath sounds normal, no stridor, rhonchi, wheezes, rales.  Abdominal: Soft. BS +,  no distension, tenderness, rebound or guarding.  Musculoskeletal: Normal range of motion. No edema  and no tenderness.  Neuro: Alert. CN 2-12 grossly intact. No focal deficits. Skin: foul smelling ulcer noted right leg  Psychiatric: Normal mood and affect.      LABORATORY PANEL:   CBC Recent Labs  Lab 07/23/18 1028  WBC 18.8*  HGB 10.8*  HCT 34.6*  PLT 364   ------------------------------------------------------------------------------------------------------------------  Chemistries  Recent Labs  Lab 07/21/18 1343  07/23/18 1028  NA 139   < > 140  K 5.2*   < > 6.2*  CL 103   < > 103  CO2 19*   < > 19*  GLUCOSE 88   < > 95  BUN 59*   < > 85*  CREATININE 10.04*   < > 13.26*  CALCIUM 10.1   < > 9.8  AST 11*  --   --   ALT 7  --   --   ALKPHOS 70  --   --   BILITOT 1.1  --   --    < > = values in this interval not displayed.   ------------------------------------------------------------------------------------------------------------------  Cardiac Enzymes No results for input(s): TROPONINI in the last 168 hours. ------------------------------------------------------------------------------------------------------------------  RADIOLOGY:  Dg Tibia/fibula Right  Result Date: 07/21/2018 CLINICAL DATA:  Patient with wound on the posterior right lower leg. History of peripheral vascular disease. EXAM: RIGHT TIBIA AND FIBULA - 2 VIEW COMPARISON:  Right lower extremity radiograph 05/30/2018 FINDINGS: Multiple skin staples are present. No radiopaque foreign body, soft tissue emphysema or osseous destruction. Degenerative changes of the knee. Vascular calcifications. Stent graft material visualized within the distal thigh. IMPRESSION: No radiographic evidence to suggest osteomyelitis. Electronically Signed   By: Lovey Newcomer M.D.   On: 07/21/2018 14:13   Dg Chest Portable 1 View  Result Date:  07/21/2018 CLINICAL DATA:  End-stage renal disease.  Hypertension. EXAM: PORTABLE CHEST 1 VIEW COMPARISON:  May 30, 2018 FINDINGS: Central catheter tips in superior vena cava. No  pneumothorax. There is no evident edema or consolidation. Heart size and pulmonary vascularity are normal. No adenopathy. There is aortic atherosclerosis. There are stents in the left axillary brachial regions. No bone lesions. IMPRESSION: Central catheter as described without pneumothorax. No edema or consolidation. Stable cardiac silhouette. Aortic Atherosclerosis (ICD10-I70.0). Electronically Signed   By: Lowella Grip III M.D.   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  1.  Lower limb right calf ulcer with severe peripheral arterial disease: Plan for angiogram this afternoon.   Discussed with Dr. dew  continue Zosyn and vancomycin. . Premedicated due to shellfish allergy 2.  End-stage renal disease on hemodialysis: Continue dialysis Monday, Wednesday and Friday..  3.  Essential hypertension: Continue Norvasc, hydralazine, Coreg, irbesartan  4.  Hyperlipidemia: Continue statin  5.  History of peripheral vascular disease: Continue Eliquis and statin  6.  History of gout: Continue colchicine prn  7.  Chronic diastolic heart failure: No signs of exacerbation      Management plans discussed with the patient and he is in agreement.  CODE STATUS: Full  TOTAL TIME TAKING CARE OF THIS PATIENT: 24 minutes.     POSSIBLE D/C 2 to 4 days, DEPENDING ON CLINICAL CONDITION.   Gwenette Wellons M.D on 07/23/2018 at 12:47 PM  Between 7am to 6pm - Pager - [phone] After 6pm go to [email or web address] - password [email or web address] [email or web address]  Office  [phone]  CC: Primary care physician; [name], MD  Note: This dictation was prepared with Dragon dictation along with smaller phrase technology. Any transcriptional  errors that result from this process are unintentional.

## 2018-07-23 NOTE — Progress Notes (Signed)
HD Tx Start   07/23/18 0951  Vital Signs  Pulse Rate 85  Resp 12  BP 118/65  Oxygen Therapy  SpO2 98 %  During Hemodialysis Assessment  Blood Flow Rate (mL/min) 400 mL/min  Arterial Pressure (mmHg) -180 mmHg  Venous Pressure (mmHg) 180 mmHg  Transmembrane Pressure (mmHg) 40 mmHg  Ultrafiltration Rate (mL/min) 570 mL/min (545mL per HOUR)  Dialysate Flow Rate (mL/min) 800 ml/min  Conductivity: Machine  14  HD Safety Checks Performed Yes  Dialysis Fluid Bolus Normal Saline  Bolus Amount (mL) 250 mL  Intra-Hemodialysis Comments Tx initiated  Hemodialysis Catheter Right Subclavian Double-lumen  Placement Date/Time: (c) (c)   Placed prior to admission: No  Orientation: Right  Access Location: Subclavian  Hemodialysis Catheter Type: Double-lumen  Site Condition No complications  Blue Lumen Status Infusing  Red Lumen Status Infusing  Purple Lumen Status N/A

## 2018-07-23 NOTE — Anesthesia Postprocedure Evaluation (Signed)
Anesthesia Post Note  Patient: Johnny [name].  Procedure(s) Performed: Lower Extremity Angiography (Right )  Patient location during evaluation: PACU Anesthesia Type: General Level of consciousness: awake and alert Pain management: pain level controlled Vital Signs Assessment: post-procedure vital signs reviewed and stable Respiratory status: spontaneous breathing, nonlabored ventilation, respiratory function stable and patient connected to nasal cannula oxygen Cardiovascular status: blood pressure returned to baseline and stable Postop Assessment: no apparent nausea or vomiting Anesthetic complications: no     Last Vitals:  Vitals:   07/23/18 1640 07/23/18 1645  BP: (!) 160/80 (!) 149/80  Pulse: 85 85  Resp: 19 18  Temp:  (!) 36.3 C  SpO2: 100% 99%    Last Pain:  Vitals:   07/23/18 1645  TempSrc:   PainSc: 0-No pain                 Precious Haws Piscitello

## 2018-07-23 NOTE — Care Management (Addendum)
RNCM attempted to meet with patient to complete assessment. Patient off the floor.  Johnny [name] dialysis liaison notified of admission.

## 2018-07-23 NOTE — Progress Notes (Signed)
Present on admission 07/21/2018

## 2018-07-23 NOTE — Transfer of Care (Signed)
Immediate Anesthesia Transfer of Care Note  Patient: Johnny [name].  Procedure(s) Performed: Lower Extremity Angiography (Right )  Patient Location: PACU  Anesthesia Type:General  Level of Consciousness: sedated and responds to stimulation  Airway & Oxygen Therapy: Patient Spontanous Breathing and Patient connected to face mask oxygen  Post-op Assessment: Report given to RN and Post -op Vital signs reviewed and stable  Post vital signs: Reviewed and stable  Last Vitals:  Vitals Value Taken Time  BP 164/80 07/23/2018  4:16 PM  Temp    Pulse 84 07/23/2018  4:16 PM  Resp 18 07/23/2018  4:16 PM  SpO2 100 % 07/23/2018  4:16 PM  Vitals shown include unvalidated device data.  Last Pain:  Vitals:   07/23/18 1329  TempSrc: Oral  PainSc: 2       Patients Stated Pain Goal: 0 (21/11/55 2080)  Complications: No apparent anesthesia complications

## 2018-07-23 NOTE — Consult Note (Signed)
WOC reviewed chart, consulted for RLE wound. Dr. Lucky Cowboy has seen this patient in consultation and has recommended wet to dry dressings. I have entered those for the nursing staff.   Will not consult for that reason.   Re consult if needed, will not follow at this time. Thanks  Leonna Schlee [employer], RN,CWOCN, CNS, [place] [phone])

## 2018-07-23 NOTE — H&P (Signed)
[facility] History & Physical Update  The patient was interviewed and re-examined.  The patient's previous History and Physical has been reviewed and is unchanged.  There is no change in the plan of care. We plan to proceed with the scheduled procedure.  Leotis Pain, MD  07/23/2018, 1:09 PM

## 2018-07-24 ENCOUNTER — Encounter: Payer: Self-pay | Admitting: Vascular Surgery

## 2018-07-24 DIAGNOSIS — Z87891 Personal history of nicotine dependence: Secondary | ICD-10-CM

## 2018-07-24 DIAGNOSIS — M7989 Other specified soft tissue disorders: Secondary | ICD-10-CM

## 2018-07-24 DIAGNOSIS — I509 Heart failure, unspecified: Secondary | ICD-10-CM

## 2018-07-24 DIAGNOSIS — Z992 Dependence on renal dialysis: Secondary | ICD-10-CM

## 2018-07-24 DIAGNOSIS — I998 Other disorder of circulatory system: Secondary | ICD-10-CM

## 2018-07-24 DIAGNOSIS — X58XXXA Exposure to other specified factors, initial encounter: Secondary | ICD-10-CM

## 2018-07-24 DIAGNOSIS — Z95820 Peripheral vascular angioplasty status with implants and grafts: Secondary | ICD-10-CM

## 2018-07-24 DIAGNOSIS — I739 Peripheral vascular disease, unspecified: Secondary | ICD-10-CM

## 2018-07-24 DIAGNOSIS — Z89422 Acquired absence of other left toe(s): Secondary | ICD-10-CM

## 2018-07-24 DIAGNOSIS — N186 End stage renal disease: Secondary | ICD-10-CM

## 2018-07-24 DIAGNOSIS — Z91013 Allergy to seafood: Secondary | ICD-10-CM

## 2018-07-24 DIAGNOSIS — S81802A Unspecified open wound, left lower leg, initial encounter: Secondary | ICD-10-CM

## 2018-07-24 LAB — CBC
HCT: 33.5 % — ABNORMAL LOW (ref 39.0–52.0)
Hemoglobin: 10.3 g/dL — ABNORMAL LOW (ref 13.0–17.0)
MCH: 29.5 pg (ref 26.0–34.0)
MCHC: 30.7 g/dL (ref 30.0–36.0)
MCV: 96 fL (ref 80.0–100.0)
Platelets: 377 10*3/uL (ref 150–400)
RBC: 3.49 MIL/uL — ABNORMAL LOW (ref 4.22–5.81)
RDW: 13.9 % (ref 11.5–15.5)
WBC: 26.6 10*3/uL — AB (ref 4.0–10.5)
nRBC: 0 % (ref 0.0–0.2)

## 2018-07-24 LAB — BASIC METABOLIC PANEL
Anion gap: 14 (ref 5–15)
BUN: 42 mg/dL — ABNORMAL HIGH (ref 8–23)
CO2: 26 mmol/L (ref 22–32)
Calcium: 9 mg/dL (ref 8.9–10.3)
Chloride: 97 mmol/L — ABNORMAL LOW (ref 98–111)
Creatinine, Ser: 7.4 mg/dL — ABNORMAL HIGH (ref 0.61–1.24)
GFR calc Af Amer: 8 mL/min — ABNORMAL LOW (ref >60)
GFR calc non Af Amer: 7 mL/min — ABNORMAL LOW (ref >60)
Glucose, Bld: 121 mg/dL — ABNORMAL HIGH (ref 70–99)
Potassium: 4.6 mmol/L (ref 3.5–5.1)
Sodium: 137 mmol/L (ref 135–145)

## 2018-07-24 LAB — GLUCOSE, CAPILLARY: Glucose-Capillary: 96 mg/dL (ref 70–99)

## 2018-07-24 MED ORDER — HYDRALAZINE HCL 20 MG/ML IJ SOLN: 10.0000 mg | Freq: Four times a day (QID) | INTRAMUSCULAR | Status: DC | PRN

## 2018-07-24 MED ORDER — IRBESARTAN 150 MG PO TABS: 75.0000 mg | ORAL_TABLET | Freq: Two times a day (BID) | ORAL | Status: DC

## 2018-07-24 NOTE — Discharge Instructions (Signed)
Continue daily dressing changes to skin graft site.

## 2018-07-24 NOTE — Progress Notes (Signed)
Per Dr. Lucky Cowboy its okay to get pt up and also for PT to work with him.

## 2018-07-24 NOTE — Progress Notes (Signed)
Vein & Vascular Surgery Daily Progress Note   Subjective: 1 Day Post-Op:  1. Ultrasound guidance for vascular access left femoral artery 2. Catheter placement into right common femoral artery from left femoral approach 3. Aortogram and selective right lower extremity angiogram including selective images of the right posterior tibial and anterior tibial arteries 4. Percutaneous transluminal angioplasty of right posterior tibial artery and tibioperoneal trunk with 2.5 mm diameter by 30 cm length angioplasty balloon 5.  Percutaneous transluminal angioplasty of the right popliteal artery and distal and mid SFA with 5 mm diameter by 30 cm length Lutonix drug-coated angioplasty balloon             6.  Percutaneous transluminal angioplasty of the proximal SFA with 6 mm diameter by 8 cm length Lutonix drug-coated angioplasty balloon             7.  Catheter directed thrombolytic therapy with 6 mg of TPA to the right SFA, popliteal artery, and posterior tibial arteries             8.  Mechanical thrombectomy with the penumbra cat 6 device to the right SFA, popliteal artery, tibioperoneal trunk, and posterior tibial arteries             9.  Viabahn stent placement to the mid to distal SFA and popliteal artery with 6 mm diameter by 25 cm length stent             10.  Percutaneous transluminal angioplasty of the right anterior tibial artery with 2.5 mm diameter by 30 cm length angioplasty balloon 11. StarClose closure device left femoral artery  Findings:  Aortogram: sluggish renal artery flow but no obvious renal artery stenosis, no aorta or iliac artery stenosis. Right lower Extremity: very calcific vessels with stenosis of the profunda femoris artery greater than 70%.  The SFA had about a 60-70% stenosis proximally.  The vessel then normalized until the mid SFA where it was occluded above the  previously placed stent.  On initial imaging, absolutely no flow seen distally occlusion of all 3 tibial vessels.  Patient with improved symptoms today. Aggrestat overnight. Now on ASA / Eliquis.  Objective: Vitals:   07/24/18 0502 07/24/18 0504 07/24/18 0517 07/24/18 0847  BP: (!) 120/53   (!) 95/37  Pulse: 91  88   Resp: 20     Temp:  98 F (36.7 C)    TempSrc:  Oral    SpO2:   96%   Weight:   64.7 kg   Height:        Intake/Output Summary (Last 24 hours) at 07/24/2018 1137 Last data filed at 07/24/2018 0900 Gross per 24 hour  Intake 1200.8 ml  Output 583 ml  Net 617.8 ml   Physical Exam: A&Ox3, NAD CV: RRR Pulmonary: CTA Bilaterally Abdomen: Soft, Nontender, Nondistended, (+) Bowel Sounds Groin: Left Access: no drainage / swelling noted Vascular:  Right lower extremity: thigh soft, calf soft. Extremity warm distally. Hard to palpate pedal pulses.  Right calf wound: Skin graft has necrosed. There is some healthy tissue noted under parts of remaining skin graft   Laboratory: CBC    Component Value Date/Time   WBC 26.6 (H) 07/24/2018 0315   HGB 10.3 (L) 07/24/2018 0315   HGB 8.8 (L) 01/17/2014 0502   HCT 33.5 (L) 07/24/2018 0315   HCT 28.1 (L) 02/28/2018 1432   PLT 377 07/24/2018 0315   PLT 212 01/17/2014 0502   BMET    Component Value  Date/Time   NA 137 07/24/2018 0315   NA 139 01/17/2014 0502   K 4.6 07/24/2018 0315   K 4.8 01/17/2014 0502   CL 97 (L) 07/24/2018 0315   CL 102 01/17/2014 0502   CO2 26 07/24/2018 0315   CO2 27 01/17/2014 0502   GLUCOSE 121 (H) 07/24/2018 0315   GLUCOSE 101 (H) 01/17/2014 0502   BUN 42 (H) 07/24/2018 0315   BUN 62 (H) 01/17/2014 0502   CREATININE 7.40 (H) 07/24/2018 0315   CREATININE 12.30 (H) 01/17/2014 0502   CALCIUM 9.0 07/24/2018 0315   CALCIUM 8.2 (L) 01/17/2014 0502   GFRNONAA 7 (L) 07/24/2018 0315   GFRNONAA 4 (L) 01/17/2014 0502   GFRAA 8 (L) 07/24/2018 0315   GFRAA 4 (L) 01/17/2014 0502    Assessment/Planning: The patient is a 68 year old male with multiple medical issues, including severe PAD s/p endovascular intervention for ischemia POD#1 1) Exhausted options for further endovascular intervention. Patient with improved arterial patency however is at high right for amputation.  2) No plan for debridement in house. Will see patient in one week and assess skin graft site.  3) Patient can be discharged home when medically stable.  4) Continue daily dressing changes.  Discussed with Dr. Ellis Parents Brenetta Penny PA-C 07/24/2018 11:37 AM

## 2018-07-24 NOTE — Progress Notes (Signed)
PT Cancellation Note  Patient Details Name: Johnny [name]. MRN: [id number] DOB: 1950/07/24   Cancelled Treatment:    Reason Eval/Treat Not Completed: Other (comment).  PT consult received.  Chart reviewed.  Upon arrival to pt's room, safeguard pressure assisted device noted to still be in place L anterior hip area.  Discussed with pt's nurse who contacted Dr. Lucky Cowboy.  Will re-attempt PT evaluation this afternoon once device is removed.  Leitha Bleak, PT 07/24/18, 11:39 AM [phone]

## 2018-07-24 NOTE — Care Management Important Message (Signed)
Important Message  Patient Details  Name: Johnny [name]. MRN: [id number] Date of Birth: 01-05-1951   Medicare Important Message Given:  Yes    Dannette Barbara 07/24/2018, 12:05 PM

## 2018-07-24 NOTE — Consult Note (Signed)
Pharmacy Antibiotic Note  Johnny [name]. is a 68 y.o. male admitted on 07/21/2018 with cellulitis.  Pharmacy has been consulted for vancomycin and Zosyn dosing. He had a new dialysis port placed a few days ago in right arm by Dr Delana Meyer. He had a skin graft on right lower leg a couple weeks ago to cover a wound which now appears to be infected with gangrenous changes.  Patient had vascular PAD 3/16  Plan: -- Continue Vancomycin 750 mg IV every MWF with HD following a 1250 mg loading dose    --Goal pre-HD level 15-25 mcg/mL: level to be drawn prior to 3rd HD session (3/20)  --Zosyn 3.375g IV q12h  Pharmacy will continue to monitor microbiology results for possible changes in therapy  Height: 5\' 8"  (172.7 cm) Weight: 142 lb 10.2 oz (64.7 kg) IBW/kg (Calculated) : 68.4  Temp (24hrs), Avg:97.8 F (36.6 C), Min:97.3 F (36.3 C), Max:98.8 F (37.1 C)  Recent Labs  Lab 07/17/18 1412 07/21/18 1343 07/22/18 0457 07/22/18 1838 07/23/18 1028 07/24/18 0315  WBC 12.2* 17.9* 15.8*  --  18.8* 26.6*  CREATININE 7.11* 10.04* 10.99* 12.14* 13.26* 7.40*    Estimated Creatinine Clearance: 8.6 mL/min (A) (by C-G formula based on SCr of 7.4 mg/dL (H)).    Antimicrobials this admission: vancomycin 3/14 >>  ceftriaxone 3/14 x 1  Zosyn 3/14 >>  Microbiology results: 3/14 WCx: Abundant GNR's and GPC's in clusters 3/14 BCx: NG x 2 days Thank you for allowing pharmacy to be a part of this patient's care.  Lu Duffel, PharmD, BCPS Clinical Pharmacist 07/24/2018 7:46 AM

## 2018-07-24 NOTE — Progress Notes (Signed)
[facility] NAME: Johnny [name]    MR#:  [id number]  DATE OF BIRTH:  February 18, 1951  SUBJECTIVE:   Patient underwent angiogram yesterday with stent placement.  REVIEW OF SYSTEMS:    Review of Systems  Constitutional: Negative for fever, chills weight loss HENT: Negative for ear pain, nosebleeds, congestion, facial swelling, rhinorrhea, neck pain, neck stiffness and ear discharge.   Respiratory: Negative for cough, shortness of breath, wheezing  Cardiovascular: Negative for chest pain, palpitations and leg swelling.  Gastrointestinal: Negative for heartburn, abdominal pain, vomiting, diarrhea or consitpation Genitourinary: Negative for dysuria, urgency, frequency, hematuria Musculoskeletal: Negative for back pain or joint pain Neurological: Negative for dizziness, seizures, syncope, focal weakness,  numbness and headaches.  Hematological: Does not bruise/bleed easily.  Psychiatric/Behavioral: Negative for hallucinations, + confusion,no dysphoric mood  SKIN: lower right leg ulcer wrapped  Tolerating Diet: yes     DRUG ALLERGIES:   Allergies  Allergen Reactions  . Shellfish Allergy Anaphylaxis    VITALS:  Blood pressure (!) 95/37, pulse 88, temperature 98 F (36.7 C), temperature source Oral, resp. rate 20, height 5\' 8"  (1.727 m), weight 64.7 kg, SpO2 96 %.  PHYSICAL EXAMINATION:  Constitutional: Appears well-developed and well-nourished. No distress. HENT: Normocephalic. [name] Oropharynx is clear and moist.  Eyes: Conjunctivae and EOM are normal. PERRLA, no scleral icterus.  Neck: Normal ROM. Neck supple. No JVD. No tracheal deviation. CVS: RRR, S1/S2 +, no murmurs, no gallops, no carotid bruit.  Pulmonary: Effort and breath sounds normal, no stridor, rhonchi, wheezes, rales.  Abdominal: Soft. BS +,  no distension, tenderness, rebound or guarding.  Musculoskeletal: Normal range of motion. No edema and no tenderness.  Neuro:  Alert. CN 2-12 grossly intact. No focal deficits. Skin:ulcer is wrapped Psychiatric: normal mood      LABORATORY PANEL:   CBC Recent Labs  Lab 07/24/18 0315  WBC 26.6*  HGB 10.3*  HCT 33.5*  PLT 377   ------------------------------------------------------------------------------------------------------------------  Chemistries  Recent Labs  Lab 07/21/18 1343  07/24/18 0315  NA 139   < > 137  K 5.2*   < > 4.6  CL 103   < > 97*  CO2 19*   < > 26  GLUCOSE 88   < > 121*  BUN 59*   < > 42*  CREATININE 10.04*   < > 7.40*  CALCIUM 10.1   < > 9.0  AST 11*  --   --   ALT 7  --   --   ALKPHOS 70  --   --   BILITOT 1.1  --   --    < > = values in this interval not displayed.   ------------------------------------------------------------------------------------------------------------------  Cardiac Enzymes No results for input(s): TROPONINI in the last 168 hours. ------------------------------------------------------------------------------------------------------------------  RADIOLOGY:  No results found.   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  1.  Lower limb right calf ulcer with severe peripheral arterial disease/gangrene Postoperative day #1 angiogram and stent placement  ID consultation for antibiotics  Continue wound care and management as per vascular surgery.    Continue Zosyn and vancomycin.  .2.  End-stage renal disease on hemodialysis: Continue dialysis Monday, Wednesday and Friday..  3.  Essential hypertension: Blood pressure low this morning.  All blood pressure  medications on hold this morning.  Reevaluate blood pressure later this afternoon.  4.  Hyperlipidemia: Continue statin  5.  History of peripheral vascular  disease: Continue Eliquis and statin  6.  History of gout: Continue colchicine prn  7.  Chronic diastolic heart failure: No signs of exacerbation   Physical therapy consultation for discharge planning   Management plans discussed with the patient and he is in agreement.  CODE STATUS: Full  TOTAL TIME TAKING CARE OF THIS PATIENT: 24 minutes.     POSSIBLE D/C 1-2 days DEPENDING ON CLINICAL CONDITION.   Bettey Costa M.D on 07/24/2018 at 11:49 AM  Between 7am to 6pm - Pager - [phone] After 6pm go to [email or web address] - password [email or web address] [facility]  Office  [phone]  CC: Primary care physician; [name], MD  Note: This dictation was prepared with Dragon dictation along with smaller phrase technology. Any transcriptional errors that result from this process are unintentional.

## 2018-07-24 NOTE — Consult Note (Signed)
NAME: Johnny [name].  DOB: 10-22-1950  MRN: [id number]  Date/Time: 07/24/2018 2:40 PM  REQUESTING PROVIDER: mody Subjective:  REASON FOR CONSULT: rt calf wound ? Johnny [name]. is a 68 y.o. with a history of ESRD, PAD, CHF  Chronic R calf and leg wound due to PAD was admitted to the hospital on  07/21/18 for rt calf wound which was worsening as per his nurse. Pt has had ulcerated calf wound for many months. On 04/10/19 he had PTA/balloon angioplasty  of the rt ATA and  Rt popliteal artery.  On 12/4 he had  Irrigation and debridement of skin, soft tissue, and muscle of the right calf and lower leg over approximately 100 cm2 with placement of VAC. As it was getting better he underwent  amniotic graft application on 12/17/89.     He then underwent on 07/19/18- Right upper arm brachial artery to axillary vein arteriovenous graft.  Was admitted on 3/14 with worsening wound with gangrenous changes.   He underwent  On 07/23/18 PTA balloon angioplastyof rt popliteal , rt posterior Tibial and tibioperoneal trunk,proximal SFA, throbolytic therapy for to rt SFA, popliteal and PTA, mechanical thrombectomy to all the vessles and stent placement to mid SFA and popliteal artery.  Past h/o  He had left foot ulceration last year and underwent   percutaneous transluminal angioplasty of the left popliteal artery, left common femoral artery and also had a stent placement to the left distal SFA and popliteal artery on 11/16/17 and the ulceration healed over a period of time  Past Medical History:  Diagnosis Date  . Anemia   . Anxiety   . CHF (congestive heart failure) ([place])   . Chronic kidney disease    esrd dialysis m/w/f  . Gout   . Hyperlipidemia   . Hypertension   . Myocardial infarction ([place]) 2010   10 years ago  . 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  . Peripheral vascular disease [facility])     Past Surgical History:  Procedure Laterality Date  . A/V SHUNTOGRAM  Left 06/21/2017   Procedure: A/V SHUNTOGRAM;  Surgeon: Katha Cabal, MD;  Location: [place] [facility];  Service: Cardiovascular;  Laterality: Left;  . APPLICATION OF WOUND VAC Right 04/11/2018   Procedure: APPLICATION OF WOUND VAC;  Surgeon: Algernon Huxley, MD;  Location: [facility] OR[facility];  Service: Vascular;  Laterality: Right;  . AV FISTULA PLACEMENT Left 09/18/2015   Procedure: INSERTION OF ARTERIOVENOUS (AV) GORE-TEX GRAFT ARM ( BRACH/AXILLARY GRAFT W/ INSTANT STICK GRAFT );  Surgeon: Katha Cabal, MD;  Location: [facility];  Service: Vascular;  Laterality: Left;  . AV FISTULA PLACEMENT Right 07/19/2018   Procedure: INSERTION OF ARTERIOVENOUS (AV) GORE-TEX GRAFT ARM ( BRACHIAL AXILLARY);  Surgeon: Algernon Huxley, MD;  Location: [facility];  Service: Vascular;  Laterality: Right;  . DIALYSIS FISTULA CREATION Right 10/2017   right chest perm cath  . ESOPHAGOGASTRODUODENOSCOPY N/A 12/19/2017   Procedure: ESOPHAGOGASTRODUODENOSCOPY (EGD);  Surgeon: Lin Landsman, MD;  Location: [facility];  Service: Gastroenterology;  Laterality: N/A;  . LOWER EXTREMITY ANGIOGRAPHY Left 11/16/2017   Procedure: LOWER EXTREMITY ANGIOGRAPHY;  Surgeon: Algernon Huxley, MD;  Location: [place] [facility];  Service: Cardiovascular;  Laterality: Left;  . LOWER EXTREMITY ANGIOGRAPHY Right 01/18/2018   Procedure: LOWER EXTREMITY ANGIOGRAPHY;  Surgeon: Algernon Huxley, MD;  Location: [place] [facility];  Service: Cardiovascular;  Laterality: Right;  . LOWER EXTREMITY ANGIOGRAPHY Left 04/02/2018  Procedure: LOWER EXTREMITY ANGIOGRAPHY;  Surgeon: Algernon Huxley, MD;  Location: [place] [facility];  Service: Cardiovascular;  Laterality: Left;  . LOWER EXTREMITY ANGIOGRAPHY Right 04/09/2018   Procedure: Lower Extremity Angiography with possible intervention;  Surgeon: Algernon Huxley, MD;  Location: [place] [facility];  Service: Cardiovascular;  Laterality: Right;  . LOWER EXTREMITY ANGIOGRAPHY Right 07/23/2018   Procedure: Lower  Extremity Angiography;  Surgeon: Algernon Huxley, MD;  Location: [place] [facility];  Service: Cardiovascular;  Laterality: Right;  . PERIPHERAL VASCULAR CATHETERIZATION Left 09/01/2015   Procedure: A/V Shuntogram/Fistulagram;  Surgeon: Katha Cabal, MD;  Location: [place] [facility];  Service: Cardiovascular;  Laterality: Left;  . PERIPHERAL VASCULAR CATHETERIZATION N/A 09/30/2015   Procedure: A/V Shuntogram/Fistulagram with perm cathether removal;  Surgeon: Algernon Huxley, MD;  Location: [place] [facility];  Service: Cardiovascular;  Laterality: N/A;  . PERIPHERAL VASCULAR CATHETERIZATION Left 09/30/2015   Procedure: A/V Shunt Intervention;  Surgeon: Algernon Huxley, MD;  Location: [place] [facility];  Service: Cardiovascular;  Laterality: Left;  . PERIPHERAL VASCULAR CATHETERIZATION Left 12/03/2015   Procedure: Thrombectomy;  Surgeon: Algernon Huxley, MD;  Location: [place] [facility];  Service: Cardiovascular;  Laterality: Left;  . PERIPHERAL VASCULAR CATHETERIZATION Left 01/28/2016   Procedure: Thrombectomy;  Surgeon: Algernon Huxley, MD;  Location: [place] [facility];  Service: Cardiovascular;  Laterality: Left;  . PERIPHERAL VASCULAR CATHETERIZATION N/A 01/28/2016   Procedure: A/V Shuntogram/Fistulagram;  Surgeon: Algernon Huxley, MD;  Location: [place] [facility];  Service: Cardiovascular;  Laterality: N/A;  . SKIN SPLIT GRAFT Right 05/24/2018   Procedure: SKIN GRAFT SPLIT THICKNESS ( RIGHT CALF);  Surgeon: Algernon Huxley, MD;  Location: [facility];  Service: Vascular;  Laterality: Right;  . WOUND DEBRIDEMENT Right 04/11/2018   Procedure: DEBRIDEMENT WOUND calf muscle and skin;  Surgeon: Algernon Huxley, MD;  Location: [facility];  Service: Vascular;  Laterality: Right;    Social History   Socioeconomic History  . Marital status: Single    Spouse name: Not on file  . Number of children: Not on file  . Years of education: Not on file  . Highest education level: Not on file  Occupational History  .  Occupation: retired    Comment: Medical laboratory scientific officer  Social Needs  . Financial resource strain: Patient refused  . Food insecurity:    Worry: Patient refused    Inability: Patient refused  . Transportation needs:    Medical: Patient refused    Non-medical: Patient refused  Tobacco Use  . Smoking status: Former Smoker    Types: Cigarettes    Last attempt to quit: 05/17/2005    Years since quitting: 13.1  . Smokeless tobacco: Never Used  Substance and Sexual Activity  . Alcohol use: No  . Drug use: No  . Sexual activity: Not Currently  Lifestyle  . Physical activity:    Days per week: Patient refused    Minutes per session: Patient refused  . Stress: Not on file  Relationships  . Social connections:    Talks on phone: Patient refused    Gets together: Patient refused    Attends religious service: Patient refused    Active member of club or organization: Patient refused    Attends meetings of clubs or organizations: Patient refused    Relationship status: Patient refused  . Intimate partner violence:    Fear of current or ex partner: Patient refused  Emotionally abused: Patient refused    Physically abused: Patient refused    Forced sexual activity: Patient refused  Other Topics Concern  . Not on file  Social History Narrative  . Not on file    Family History  Problem Relation Age of Onset  . Hypertension Other   . Heart disease Other   . Diabetes Mother    Allergies  Allergen Reactions  . Shellfish Allergy Anaphylaxis   ? Current Facility-Administered Medications  Medication Dose Route Frequency Provider Last Rate Last Dose  . 0.9 %  sodium chloride infusion   Intravenous PRN Bettey Costa, MD 10 mL/hr at 07/24/18 0537    . acetaminophen (TYLENOL) tablet 650 mg  650 mg Oral Q6H PRN Algernon Huxley, MD       Or  . acetaminophen (TYLENOL) suppository 650 mg  650 mg Rectal Q6H PRN Algernon Huxley, MD      . allopurinol (ZYLOPRIM) tablet 100 mg  100 mg Oral Daily  Algernon Huxley, MD   Stopped at 07/24/18 [phone]  . amLODipine (NORVASC) tablet 10 mg  10 mg Oral Daily Algernon Huxley, MD   Stopped at 07/24/18 [phone]  . apixaban (ELIQUIS) tablet 2.5 mg  2.5 mg Oral BID Dallie Piles, RPH   2.5 mg at 07/24/18 0850  . aspirin EC tablet 81 mg  81 mg Oral Daily Algernon Huxley, MD   81 mg at 07/24/18 0848  . atorvastatin (LIPITOR) tablet 10 mg  10 mg Oral Daily Algernon Huxley, MD   10 mg at 07/24/18 0848  . bisacodyl (DULCOLAX) EC tablet 5 mg  5 mg Oral Daily PRN Algernon Huxley, MD      . calcitRIOL (ROCALTROL) capsule 0.5 mcg  0.5 mcg Oral Daily Algernon Huxley, MD   0.5 mcg at 07/24/18 0851  . calcium acetate (PHOSLO) capsule 1,334 mg  1,334 mg Oral TID WC Algernon Huxley, MD   1,334 mg at 07/24/18 1208  . Chlorhexidine Gluconate Cloth 2 % PADS 6 each  6 each Topical Q0600 Algernon Huxley, MD   6 each at 07/24/18 [phone]  . clopidogrel (PLAVIX) tablet 75 mg  75 mg Oral Daily Algernon Huxley, MD   75 mg at 07/24/18 0849  . colchicine tablet 0.6 mg  0.6 mg Oral Daily PRN Algernon Huxley, MD      . diphenhydrAMINE (BENADRYL) capsule 50 mg  50 mg Oral Once Algernon Huxley, MD       Or  . diphenhydrAMINE (BENADRYL) injection 50 mg  50 mg Intravenous Once Algernon Huxley, MD      . docusate sodium (COLACE) capsule 100 mg  100 mg Oral BID Algernon Huxley, MD   100 mg at 07/24/18 0851  . gabapentin (NEURONTIN) capsule 300 mg  300 mg Oral QHS Algernon Huxley, MD   300 mg at 07/23/18 2106  . hydrALAZINE (APRESOLINE) injection 10 mg  10 mg Intravenous Q6H PRN Mody, Sital, MD      . HYDROcodone-acetaminophen (NORCO/VICODIN) 5-325 MG per tablet 1 tablet  1 tablet Oral Q4H PRN Algernon Huxley, MD   1 tablet at 07/23/18 1905  . HYDROmorphone (DILAUDID) injection 1 mg  1 mg Intravenous Once PRN Algernon Huxley, MD      . methylPREDNISolone sodium succinate (SOLU-MEDROL) 40 mg/mL injection 40 mg  40 mg Intravenous Once Algernon Huxley, MD      . metoprolol succinate (TOPROL-XL) 24 hr  tablet 25 mg  25 mg Oral QHS Algernon Huxley, MD    25 mg at 07/23/18 2107  . ondansetron (ZOFRAN) tablet 4 mg  4 mg Oral Q6H PRN Algernon Huxley, MD       Or  . ondansetron [facility]) injection 4 mg  4 mg Intravenous Q6H PRN Algernon Huxley, MD   4 mg at 07/22/18 0517  . ondansetron (ZOFRAN) injection 4 mg  4 mg Intravenous Q6H PRN Algernon Huxley, MD      . piperacillin-tazobactam (ZOSYN) IVPB 3.375 g  3.375 g Intravenous Q12H Algernon Huxley, MD 12.5 mL/hr at 07/24/18 0537 3.375 g at 07/24/18 0537  . pregabalin (LYRICA) capsule 50 mg  50 mg Oral Daily Algernon Huxley, MD   50 mg at 07/24/18 0849  . sevelamer carbonate (RENVELA) tablet 2,400 mg  2,400 mg Oral TID WC Algernon Huxley, MD   2,400 mg at 07/24/18 1208  . traZODone (DESYREL) tablet 25 mg  25 mg Oral QHS PRN Algernon Huxley, MD   25 mg at 07/22/18 2329  . vancomycin (VANCOCIN) IVPB 750 mg/150 ml premix  750 mg Intravenous Q M,W,F-HD Algernon Huxley, MD   Stopped at 07/23/18 1916     Abtx:  Anti-infectives (From admission, onward)   Start     Dose/Rate Route Frequency Ordered Stop   07/23/18 1400  ceFAZolin (ANCEF) 2-4 GM/100ML-% IVPB    Note to Pharmacy:  Birdie Sons   : cabinet override      07/23/18 1400 07/23/18 1414   07/23/18 1200  vancomycin (VANCOCIN) IVPB 750 mg/150 ml premix     750 mg 150 mL/hr over 60 Minutes Intravenous Every M-W-F (Hemodialysis) 07/21/18 1511     07/23/18 0814  ceFAZolin (ANCEF) IVPB 2g/100 mL premix     2 g 200 mL/hr over 30 Minutes Intravenous 30 min pre-op 07/23/18 0814 07/23/18 1444   07/21/18 2200  piperacillin-tazobactam (ZOSYN) IVPB 3.375 g     3.375 g 12.5 mL/hr over 240 Minutes Intravenous Every 12 hours 07/21/18 1626     07/21/18 1600  vancomycin (VANCOCIN) 1,250 mg in sodium chloride 0.9 % 250 mL IVPB     1,250 mg 166.7 mL/hr over 90 Minutes Intravenous  Once 07/21/18 1511 07/21/18 1837   07/21/18 1515  piperacillin-tazobactam (ZOSYN) IVPB 3.375 g     3.375 g 100 mL/hr over 30 Minutes Intravenous  Once 07/21/18 1511 07/21/18 1608   07/21/18 1500   vancomycin (VANCOCIN) IVPB 1000 mg/200 mL premix  Status:  Discontinued     1,000 mg 200 mL/hr over 60 Minutes Intravenous  Once 07/21/18 1446 07/21/18 1511   07/21/18 1500  cefTRIAXone (ROCEPHIN) 2 g in sodium chloride 0.9 % 100 mL IVPB  Status:  Discontinued     2 g 200 mL/hr over 30 Minutes Intravenous  Once 07/21/18 1446 07/21/18 1512      REVIEW OF SYSTEMS:  Const: negative fever, negative chills, negative weight loss Eyes: negative diplopia or visual changes, negative eye pain ENT: negative coryza, negative sore throat Resp: negative cough, hemoptysis, dyspnea Cards: negative for chest pain, palpitations, lower extremity edema GU: negative for frequency, dysuria and hematuria GI: Negative for abdominal pain, diarrhea, bleeding, constipation Skin: negative for rash and pruritus Heme: negative for easy bruising and gum/nose bleeding MS: negative for myalgias, arthralgias, back pain and muscle weakness Neurolo:negative for headaches, dizziness, vertigo, memory problems  Psych: negative for feelings of anxiety, depression  Allergy/Immunology- shell fish allergy Objective:  VITALS:  BP (!) 105/91 (BP Location: Left Leg)   Pulse 74   Temp 97.7 F (36.5 C)   Resp 20   Ht 5\' 8"  (1.727 m)   Wt 64.7 kg   SpO2 100%   BMI 21.69 kg/m  PHYSICAL EXAM:  General: Alert, cooperative, no distress, appears stated age.  Head: Normocephalic, without obvious abnormality, atraumatic. Eyes: Conjunctivae clear, anicteric sclerae. Pupils are equal ENT Nares normal. No drainage or sinus tenderness. Lips, mucosa, and tongue normal. No Thrush Neck: Supple, symmetrical, no adenopathy, thyroid: non tender no carotid bruit and no JVD. Back: okay Lungs:b/la ir entry Heart:s1s2 Abdomen: Soft,   Extremities: rt arm- surgical incision on the cubital  glued  Swollen rt arm    rt leg -posterior aspect over the calf- ulcerating wound healing in places and look ischemic/dry gangrene in patches does  not look infected.     Dark discoloration Left foot- no ulcer- absent 5th toe Skin: No rashes or lesions. Or bruising Lymph: Cervical, supraclavicular normal. Neurologic: Grossly non-focal Pertinent Labs Lab Results CBC    Component Value Date/Time   WBC 26.6 (H) 07/24/2018 0315   RBC 3.49 (L) 07/24/2018 0315   HGB 10.3 (L) 07/24/2018 0315   HGB 8.8 (L) 01/17/2014 0502   HCT 33.5 (L) 07/24/2018 0315   HCT 28.1 (L) 02/28/2018 1432   PLT 377 07/24/2018 0315   PLT 212 01/17/2014 0502   MCV 96.0 07/24/2018 0315   MCV 93 01/17/2014 0502   MCH 29.5 07/24/2018 0315   MCHC 30.7 07/24/2018 0315   RDW 13.9 07/24/2018 0315   RDW 15.4 (H) 01/17/2014 0502   LYMPHSABS 1.5 07/21/2018 1343   LYMPHSABS 1.5 01/17/2014 0502   MONOABS 1.7 (H) 07/21/2018 1343   MONOABS 1.2 (H) 01/17/2014 0502   EOSABS 0.1 07/21/2018 1343   EOSABS 0.0 01/17/2014 0502   BASOSABS 0.1 07/21/2018 1343   BASOSABS 0.1 01/17/2014 0502    CMP Latest Ref Rng & Units 07/24/2018 07/23/2018 07/22/2018  Glucose 70 - 99 mg/dL 121(H) 95 107(H)  BUN 8 - 23 mg/dL 42(H) 85(H) 75(H)  Creatinine 0.61 - 1.24 mg/dL 7.40(H) 13.26(H) 12.14(H)  Sodium 135 - 145 mmol/L 137 140 141  Potassium 3.5 - 5.1 mmol/L 4.6 6.2(H) 5.0  Chloride 98 - 111 mmol/L 97(L) 103 105  CO2 22 - 32 mmol/L 26 19(L) 20(L)  Calcium 8.9 - 10.3 mg/dL 9.0 9.8 9.4  Total Protein 6.5 - 8.1 g/dL - - -  Total Bilirubin 0.3 - 1.2 mg/dL - - -  Alkaline Phos 38 - 126 U/L - - -  AST 15 - 41 U/L - - -  ALT 0 - 44 U/L - - -      Microbiology: Recent Results (from the past 240 hour(s))  Surgical pcr screen     Status: Abnormal   Collection Time: 07/17/18  2:49 PM  Result Value Ref Range Status   MRSA, PCR NEGATIVE NEGATIVE Final   Staphylococcus aureus POSITIVE (A) NEGATIVE Final    Comment: (NOTE) The Xpert SA Assay (FDA approved for NASAL specimens in patients 50 years of age and older), is one component of a comprehensive surveillance program. It is not  intended to diagnose infection nor to guide or monitor treatment. Performed at [facility], [address] [place], [place] [address]   Wound or Superficial Culture     Status: None (Preliminary result)   Collection Time: 07/21/18  2:40 PM  Result Value Ref Range Status  Specimen Description   Final    WOUND Performed at [facility], [address] [place], [place] [address]    Special Requests   Final    NONE Performed at [facility], [place], [place] [address]    Gram Stain   Final    FEW WBC PRESENT, PREDOMINANTLY PMN ABUNDANT GRAM NEGATIVE RODS ABUNDANT GRAM POSITIVE COCCI IN PAIRS IN CLUSTERS    Culture   Final    ABUNDANT ENTEROBACTER CLOACAE CULTURE REINCUBATED FOR BETTER GROWTH Performed at [facility], [place] [address], [place], [place] [address]    Report Status PENDING  Incomplete   Organism ID, Bacteria ENTEROBACTER CLOACAE  Final      Susceptibility   Enterobacter cloacae - MIC*    CEFAZOLIN >=64 RESISTANT Resistant     CEFEPIME <=1 SENSITIVE Sensitive     CEFTAZIDIME <=1 SENSITIVE Sensitive     CEFTRIAXONE <=1 SENSITIVE Sensitive     CIPROFLOXACIN <=0.25 SENSITIVE Sensitive     GENTAMICIN <=1 SENSITIVE Sensitive     IMIPENEM 1 SENSITIVE Sensitive     TRIMETH/SULFA <=20 SENSITIVE Sensitive     PIP/TAZO 8 SENSITIVE Sensitive     * ABUNDANT ENTEROBACTER CLOACAE  Blood culture (single)     Status: None (Preliminary result)   Collection Time: 07/21/18  5:35 PM  Result Value Ref Range Status   Specimen Description BLOOD LFOA  Final   Special Requests   Final    BOTTLES DRAWN AEROBIC AND ANAEROBIC Blood Culture adequate volume   Culture   Final    NO GROWTH 3 DAYS Performed at [facility], [address], [place], [place] [address]    Report Status PENDING  Incomplete    IMAGING RESULTS: I have personally reviewed the films ? Impression/Recommendation ? ?Peripheral arterial disease Rt  with ulcerating wound on the calf which is not terribly infected but ischemic- s/p vascular intervention yestday with extensive balloon angioplasty, thrombolysis and thrombectomy and stent placement of SFA, polpliteal, tibial Waiting for the wound to demarcate and also improve with the intervention Leucocytosis could be from the ischemia and also aggravated by solumedrol he recieevd yesterday. Enterobacter in the wound HE can get Ceftazidime as OP during dialysis 1-1-2 grams for 2 weeks until follow up with vascular as OP and decision is made regarding need for amputation   Rt arm swelling - expected after graft creation  ESRD on dialysis-  Discussed with vascular, and communicated the antibiotic plan to Nephrology and hospitalist ID will sign off- call if needed ? ___________________________________________________ Discussed with patient, requesting provider Note:  This document was prepared using Dragon voice recognition software and may include unintentional dictation errors.

## 2018-07-24 NOTE — Evaluation (Signed)
Physical Therapy Evaluation Patient Details Name: Johnny [name]. MRN: [id number] DOB: [birth year] Today's Date: 07/24/2018   History of Present Illness  Pt is a 68 y.o. male presenting to hospital 07/21/18 with foul-smelling odor from R LE wound.  Pt with recent R upper arm AV graft 07/19/18.  Pt admitted with R calf lower limb chronic ulcer.  S/p angio 07/23/18 (PT continue at transfer order received).  PMH includes ESRD MWF, anemia, anxiety, CHF, htn, MI, neuromuscular disorder (neuropathy R LE), PVD, AV fistula placement L UE, h/o R calf skin graft 05/24/18.  Clinical Impression  Prior to hospital admission, pt reports using manual w/c at times and other times ambulating with SPC.  Pt reports living in 3rd floor apt with elevator access.  Pt oriented to self and most of date but not to situation or place (except in [place]); general confusion noted and pt intermittently appearing drowsy.  Currently pt requires assist with bed mobility and mod to max assist to stand up to RW from bed.  Pt reporting significant R LE pain with standing and unable to take any steps with walker d/t pain (nurse notified).  Pt would benefit from skilled PT to address noted impairments and functional limitations (see below for any additional details).  Upon hospital discharge, recommend pt discharge to [facility].    Follow Up Recommendations SNF    Equipment Recommendations  Rolling walker with 5" wheels;3in1 (PT);Wheelchair (measurements PT);Wheelchair cushion (measurements PT)    Recommendations for Other Services       Precautions / Restrictions Precautions Precautions: Fall Precaution Comments: No BP B UE's (take on L LE) Restrictions Weight Bearing Restrictions: No      Mobility  Bed Mobility Overal bed mobility: Needs Assistance Bed Mobility: Supine to Sit;Sit to Supine     Supine to sit: Mod assist;HOB elevated Sit to supine: +2 for physical assistance   General bed mobility comments: assist for  trunk and B LE's; vc's for technique; 2 assist to boost up in bed  Transfers Overall transfer level: Needs assistance Equipment used: Rolling walker (2 wheeled) Transfers: Sit to/from Stand Sit to Stand: Mod assist;Max assist         General transfer comment: vc's for UE/LE placement; assist to initiate and come to full stand (up to RW)  Ambulation/Gait             General Gait Details: pt unable to take any steps (even with cueing and walker use) d/t pt reports of significant R LE pain with any WB'ing  Stairs            Wheelchair Mobility    Modified Rankin (Stroke Patients Only)       Balance Overall balance assessment: Needs assistance Sitting-balance support: No upper extremity supported;Feet supported Sitting balance-Leahy Scale: Good Sitting balance - Comments: steady sitting reaching within BOS with L UE   Standing balance support: Bilateral upper extremity supported Standing balance-Leahy Scale: Poor Standing balance comment: pt requiring B UE support for static standing balance                             Pertinent Vitals/Pain Pain Assessment: Faces Pain Score: 4 (10/10 with standing; 4/10 at rest) Pain Location: R lower leg Pain Descriptors / Indicators: Aching;Tender;Sore;Guarding Pain Intervention(s): Limited activity within patient's tolerance;Monitored during session;Repositioned;Other (comment)(RN notified of pt's reports of pain)    Home Living Family/patient expects to be discharged to:: Private residence Living Arrangements: Alone  Type of Home: Apartment(3rd floor) Home Access: Elevator     Home Layout: One level Home Equipment: Grab bars - tub/shower;Wheelchair - [employer] - 4 wheels;Cane - single point;Grab bars - toilet      Prior Function           Comments: Pt inconsistent with information provided but reports sometimes he uses manual w/c and sometimes he walks with SPC.     Hand Dominance         Extremity/Trunk Assessment   Upper Extremity Assessment Upper Extremity Assessment: RUE deficits/detail;LUE deficits/detail RUE Deficits / Details: fair R hand grip strength; at least 2+/5 elbow flexion/extension; shoulder flexion AROM to grossly 60 degrees LUE Deficits / Details: good L hand grip strength; at least 3/5 AROM elbow flexion/extension; shoulder flexion AROM to grossly 80 degrees    Lower Extremity Assessment Lower Extremity Assessment: RLE deficits/detail;LLE deficits/detail RLE Deficits / Details: R DF 1/5; fair R quad set; at least 3-/5 hip flexion; at least 3/5 AROM knee flexion/extension LLE Deficits / Details: L DF at least 3/5 AROM; good L quad set; at least 3/5 hip flexion; at least 3+/5 knee flexion/extension LLE: Unable to fully assess due to pain    Cervical / Trunk Assessment Cervical / Trunk Assessment: Normal  Communication   Communication: No difficulties  Cognition Arousal/Alertness: (Drowsy but woken with vc's) Behavior During Therapy: Flat affect Overall Cognitive Status: No family/caregiver present to determine baseline cognitive functioning                                 General Comments: Oriented to person, [place], month, and year only      General Comments General comments (skin integrity, edema, etc.): R lower leg bandage in place.  Nursing cleared pt for participation in physical therapy.  Pt agreeable to PT session.  PAD now removed and no drainage noted from site beginning and end of session.    Exercises General Exercises - Lower Extremity Long Arc Quad: AROM;Strengthening;Both;10 reps;Seated(pt requiring vc's for R LE technique) Hip Flexion/Marching: AROM;Strengthening;Both;10 reps;Seated   Assessment/Plan    PT Assessment Patient needs continued PT services  PT Problem List Decreased strength;Decreased range of motion;Decreased activity tolerance;Decreased balance;Decreased mobility;Decreased knowledge of use of  DME;Decreased knowledge of precautions;Pain;Decreased skin integrity       PT Treatment Interventions DME instruction;Gait training;Functional mobility training;Therapeutic activities;Therapeutic exercise;Balance training;Patient/family education    PT Goals (Current goals can be found in the Care Plan section)  Acute Rehab PT Goals Patient Stated Goal: to improve mobility and have less pain PT Goal Formulation: With patient Time For Goal Achievement: 08/07/18 Potential to Achieve Goals: Fair    Frequency Min 2X/week   Barriers to discharge Decreased caregiver support      Co-evaluation               AM-PAC PT "6 Clicks" Mobility  Outcome Measure Help needed turning from your back to your side while in a flat bed without using bedrails?: A Little Help needed moving from lying on your back to sitting on the side of a flat bed without using bedrails?: A Lot Help needed moving to and from a bed to a chair (including a wheelchair)?: A Lot Help needed standing up from a chair using your arms (e.g., wheelchair or bedside chair)?: A Lot Help needed to walk in hospital room?: Total Help needed climbing 3-5 steps with a railing? : Total 6 Click Score:  11    End of Session Equipment Utilized During Treatment: Gait belt Activity Tolerance: Patient limited by pain Patient left: in bed;with call bell/phone within reach;with bed alarm set Nurse Communication: Mobility status;Precautions;Other (comment);Patient requests pain meds(pt needing clean-up (small bowel incontinence noted)) PT Visit Diagnosis: Other abnormalities of gait and mobility (R26.89);Muscle weakness (generalized) (M62.81);Difficulty in walking, not elsewhere classified (R26.2);Pain Pain - Right/Left: Right Pain - part of body: Leg    Time: 1435-1455 PT Time Calculation (min) (ACUTE ONLY): 20 min   Charges:   PT Evaluation $PT Eval Low Complexity: 1 Low PT Treatments $Therapeutic Activity: 8-22 mins        Leitha Bleak, PT 07/24/18, 3:26 PM [phone]

## 2018-07-24 NOTE — Progress Notes (Signed)
[facility], [place] 07/24/18  Subjective:  Patient with right lower extremity angiogram yesterday with multiple sites of PTCA. He also completed dialysis yesterday. Hemodialysis in tomorrow.   Objective:  Vital signs in last 24 hours:  Temp:  [97.3 F (36.3 C)-98.3 F (36.8 C)] 97.7 F (36.5 C) (03/17 1151) Pulse Rate:  [74-91] 74 (03/17 1151) Resp:  [16-20] 20 (03/17 0502) BP: (95-166)/(37-92) 105/91 (03/17 1151) SpO2:  [96 %-100 %] 100 % (03/17 1151) Weight:  [64.7 kg] 64.7 kg (03/17 0517)  Weight change: 0 kg Filed Weights   07/23/18 0950 07/23/18 1245 07/24/18 0517  Weight: 64.3 kg 63.7 kg 64.7 kg    Intake/Output:    Intake/Output Summary (Last 24 hours) at 07/24/2018 1320 Last data filed at 07/24/2018 0900 Gross per 24 hour  Intake 1200.8 ml  Output -  Net 1200.8 ml     Physical Exam: General:  Chronically ill-appearing, laying in the bed  HEENT  anicteric, moist oral mucous membranes  Neck  supple  Pulm/lungs  normal breathing effort, clear to auscultation  CVS/Heart  no rub  Abdomen:   Soft, nontender  Extremities:  Right arm edema around the new AV graft  Neurologic:  Alert, follows commands  Skin:  Foul-smelling wound in the right leg and mid shin area  Access:  Right IJ PermCath, right upper arm AV graft-newly placed       Basic Metabolic Panel:  Recent Labs  Lab 07/21/18 1343 07/22/18 0457 07/22/18 1838 07/23/18 1028 07/24/18 0315  NA 139 140 141 140 137  K 5.2* 5.2* 5.0 6.2* 4.6  CL 103 102 105 103 97*  CO2 19* 20* 20* 19* 26  GLUCOSE 88 98 107* 95 121*  BUN 59* 69* 75* 85* 42*  CREATININE 10.04* 10.99* 12.14* 13.26* 7.40*  CALCIUM 10.1 9.9 9.4 9.8 9.0  PHOS  --   --   --  8.0*  --      CBC: Recent Labs  Lab 07/17/18 1412 07/19/18 1058 07/21/18 1343 07/22/18 0457 07/23/18 1028 07/24/18 0315  WBC 12.2*  --  17.9* 15.8* 18.8* 26.6*  NEUTROABS 9.3*  --  14.4*  --   --   --   HGB 11.9* 9.5* 10.8*  10.2* 10.8* 10.3*  HCT 38.4* 28.0* 34.4* 33.7* 34.6* 33.5*  MCV 95.8  --  95.6 97.7 96.4 96.0  PLT 274  --  283 283 364 377      Lab Results  Component Value Date   HEPBSAG Negative 09/30/2015   HEPBSAB Reactive 01/29/2018      Microbiology:  Recent Results (from the past 240 hour(s))  Surgical pcr screen     Status: Abnormal   Collection Time: 07/17/18  2:49 PM  Result Value Ref Range Status   MRSA, PCR NEGATIVE NEGATIVE Final   Staphylococcus aureus POSITIVE (A) NEGATIVE Final    Comment: (NOTE) The Xpert SA Assay (FDA approved for NASAL specimens in patients 36 years of age and older), is one component of a comprehensive surveillance program. It is not intended to diagnose infection nor to guide or monitor treatment. Performed at [facility], [address] [place], [place] [address]   Wound or Superficial Culture     Status: None (Preliminary result)   Collection Time: 07/21/18  2:40 PM  Result Value Ref Range Status   Specimen Description   Final    WOUND Performed at [facility], [address], [place], [place] [address]  Special Requests   Final    NONE Performed at [facility], [address] [place], [place] [address]    Gram Stain   Final    FEW WBC PRESENT, PREDOMINANTLY PMN ABUNDANT GRAM NEGATIVE RODS ABUNDANT GRAM POSITIVE COCCI IN PAIRS IN CLUSTERS    Culture   Final    ABUNDANT ENTEROBACTER CLOACAE CULTURE REINCUBATED FOR BETTER GROWTH Performed at [facility], [place] [address], [place], [place] [address]    Report Status PENDING  Incomplete   Organism ID, Bacteria ENTEROBACTER CLOACAE  Final      Susceptibility   Enterobacter cloacae - MIC*    CEFAZOLIN >=64 RESISTANT Resistant     CEFEPIME <=1 SENSITIVE Sensitive     CEFTAZIDIME <=1 SENSITIVE Sensitive     CEFTRIAXONE <=1 SENSITIVE Sensitive     CIPROFLOXACIN <=0.25 SENSITIVE Sensitive     GENTAMICIN <=1 SENSITIVE Sensitive     IMIPENEM 1  SENSITIVE Sensitive     TRIMETH/SULFA <=20 SENSITIVE Sensitive     PIP/TAZO 8 SENSITIVE Sensitive     * ABUNDANT ENTEROBACTER CLOACAE  Blood culture (single)     Status: None (Preliminary result)   Collection Time: 07/21/18  5:35 PM  Result Value Ref Range Status   Specimen Description BLOOD LFOA  Final   Special Requests   Final    BOTTLES DRAWN AEROBIC AND ANAEROBIC Blood Culture adequate volume   Culture   Final    NO GROWTH 3 DAYS Performed at [facility], [address] [place], [place] [address]    Report Status PENDING  Incomplete    Coagulation Studies: No results for input(s): LABPROT, INR in the last 72 hours.  Urinalysis: No results for input(s): COLORURINE, LABSPEC, PHURINE, GLUCOSEU, HGBUR, BILIRUBINUR, KETONESUR, PROTEINUR, UROBILINOGEN, NITRITE, LEUKOCYTESUR in the last 72 hours.  Invalid input(s): APPERANCEUR    Imaging: No results found.   Medications:   . sodium chloride 10 mL/hr at 07/24/18 0537  . piperacillin-tazobactam (ZOSYN)  IV 3.375 g (07/24/18 0537)  . vancomycin Stopped (07/23/18 1916)   . allopurinol  100 mg Oral Daily  . amLODipine  10 mg Oral Daily  . apixaban  2.5 mg Oral BID  . aspirin EC  81 mg Oral Daily  . atorvastatin  10 mg Oral Daily  . calcitRIOL  0.5 mcg Oral Daily  . calcium acetate  1,334 mg Oral TID WC  . Chlorhexidine Gluconate Cloth  6 each Topical Q0600  . clopidogrel  75 mg Oral Daily  . diphenhydrAMINE  50 mg Oral Once   Or  . diphenhydrAMINE  50 mg Intravenous Once  . docusate sodium  100 mg Oral BID  . gabapentin  300 mg Oral QHS  . methylPREDNISolone (SOLU-MEDROL) injection  40 mg Intravenous Once  . metoprolol succinate  25 mg Oral QHS  . pregabalin  50 mg Oral Daily  . sevelamer carbonate  2,400 mg Oral TID WC   sodium chloride, acetaminophen **OR** acetaminophen, bisacodyl, colchicine, hydrALAZINE, HYDROcodone-acetaminophen, HYDROmorphone (DILAUDID) injection, ondansetron **OR** ondansetron  (ZOFRAN) IV, ondansetron (ZOFRAN) IV, traZODone  Assessment/ Plan:  68 y.o. African-American male  with end stage renal disease on hemodialysis, congestive heart failure, hypertension, peripheral vascular disease  [place]. MWF Rt IJ pC 65 kg, new AVG (Dr Lucky Cowboy 07/18/2018)  1. End-stage renal disease with mild hyperkalemia    2. Anemia of chronic kidney disease- EPO with HD 3. Secondary hyperparathyroidism- on binders 4.  Right lower extremity chronic wound  Plan: patient completed hemodialysis yesterday.  No acute indication for dialysis today.  We will plan for dialysis again tomorrow.  Hemoglobin at last check was 10.3.  Phosphorous was quite high at 8.0.  Patient will be maintained on Renvela 3 tablets by mouth 3 times a day with meals.  We plan to recheck serum phosphorus tomorrow.  Otherwise continue supportive care as the patient had an angiogram performed yesterday.    LOS: 3 Aubri Gathright 3/17/20201:20 PM  [place], [place]  Note: This note was prepared with Dragon dictation. Any transcription errors are unintentional

## 2018-07-25 ENCOUNTER — Encounter (INDEPENDENT_AMBULATORY_CARE_PROVIDER_SITE_OTHER): Payer: Medicare Other

## 2018-07-25 ENCOUNTER — Ambulatory Visit (INDEPENDENT_AMBULATORY_CARE_PROVIDER_SITE_OTHER): Payer: Medicare Other | Admitting: Nurse Practitioner

## 2018-07-25 LAB — CBC
HCT: 36.4 % — ABNORMAL LOW (ref 39.0–52.0)
Hemoglobin: 10.9 g/dL — ABNORMAL LOW (ref 13.0–17.0)
MCH: 29.3 pg (ref 26.0–34.0)
MCHC: 29.9 g/dL — ABNORMAL LOW (ref 30.0–36.0)
MCV: 97.8 fL (ref 80.0–100.0)
NRBC: 0 % (ref 0.0–0.2)
PLATELETS: 321 10*3/uL (ref 150–400)
RBC: 3.72 MIL/uL — ABNORMAL LOW (ref 4.22–5.81)
RDW: 13.8 % (ref 11.5–15.5)
WBC: 19.9 10*3/uL — ABNORMAL HIGH (ref 4.0–10.5)

## 2018-07-25 LAB — BASIC METABOLIC PANEL
Anion gap: 17 — ABNORMAL HIGH (ref 5–15)
BUN: 59 mg/dL — ABNORMAL HIGH (ref 8–23)
CALCIUM: 9.4 mg/dL (ref 8.9–10.3)
CO2: 26 mmol/L (ref 22–32)
CREATININE: 8.76 mg/dL — AB (ref 0.61–1.24)
Chloride: 95 mmol/L — ABNORMAL LOW (ref 98–111)
GFR calc non Af Amer: 6 mL/min — ABNORMAL LOW (ref >60)
GFR, EST AFRICAN AMERICAN: 6 mL/min — AB (ref >60)
Glucose, Bld: 91 mg/dL (ref 70–99)
Potassium: 4.6 mmol/L (ref 3.5–5.1)
Sodium: 138 mmol/L (ref 135–145)

## 2018-07-25 LAB — GLUCOSE, CAPILLARY: Glucose-Capillary: 82 mg/dL (ref 70–99)

## 2018-07-25 LAB — PHOSPHORUS: Phosphorus: 5.8 mg/dL — ABNORMAL HIGH (ref 2.5–4.6)

## 2018-07-25 MED ORDER — SODIUM CHLORIDE 0.9 % IV SOLN
1.0000 g | INTRAVENOUS | Status: DC
  Administered 2018-07-25: 1 g via INTRAVENOUS
  Filled 2018-07-25 (×2): qty 1

## 2018-07-25 MED ORDER — GABAPENTIN 100 MG PO CAPS
100.0000 mg | ORAL_CAPSULE | Freq: Every day | ORAL | Status: DC
  Administered 2018-07-25 – 2018-07-26 (×2): 100 mg via ORAL
  Filled 2018-07-25 (×2): qty 1

## 2018-07-25 MED ORDER — CHLORHEXIDINE GLUCONATE CLOTH 2 % EX PADS
6.0000 | MEDICATED_PAD | Freq: Every day | CUTANEOUS | Status: DC
  Administered 2018-07-25 – 2018-07-26 (×2): 6 via TOPICAL

## 2018-07-25 MED ORDER — MUPIROCIN 2 % EX OINT
1.0000 "application " | TOPICAL_OINTMENT | Freq: Two times a day (BID) | CUTANEOUS | Status: DC
  Administered 2018-07-25 – 2018-07-26 (×3): 1 via NASAL
  Filled 2018-07-25: qty 22

## 2018-07-25 NOTE — Progress Notes (Signed)
Post HD Tx    07/25/18 1315  Vital Signs  Pulse Rate 69  Resp 19  BP (!) 159/69  Oxygen Therapy  SpO2 97 %  Post-Hemodialysis Assessment  Rinseback Volume (mL) 250 mL  Dialyzer Clearance Lightly streaked  Duration of HD Treatment -hour(s) 3.5 hour(s)  Hemodialysis Intake (mL) 500 mL  UF Total -Machine (mL) 2000 mL  Net UF (mL) 1500 mL  Tolerated HD Treatment Yes  Post-Hemodialysis Comments pt stable

## 2018-07-25 NOTE — Progress Notes (Signed)
HD Tx Start   07/25/18 0945  Vital Signs  Pulse Rate 66  Pulse Rate Source Monitor  Resp 16  BP (!) 105/43  BP Location Left Leg  BP Method Automatic  Patient Position (if appropriate) Lying  Oxygen Therapy  SpO2 98 %  O2 Device Room Air  Pain Assessment  Pain Scale 0-10  Pain Score 0  During Hemodialysis Assessment  Blood Flow Rate (mL/min) 400 mL/min  Arterial Pressure (mmHg) -160 mmHg  Venous Pressure (mmHg) 180 mmHg  Transmembrane Pressure (mmHg) 40 mmHg  Ultrafiltration Rate (mL/min) 430 mL/min (426mL removed PER HOUR)  Dialysate Flow Rate (mL/min) 800 ml/min  Conductivity: Machine  14  HD Safety Checks Performed Yes  Dialysis Fluid Bolus Normal Saline  Bolus Amount (mL) 250 mL  Intra-Hemodialysis Comments Tx initiated  Fistula / Graft Right Upper arm Arteriovenous vein graft  Placement Date/Time: 07/19/18 1324   Placed prior to admission: No  Orientation: Right  Access Location: (c) Upper arm  Access Type: Arteriovenous vein graft  Site Condition No complications  Fistula / Graft Assessment Present;Thrill;Bruit  Drainage Description None  Hemodialysis Catheter Right Subclavian Double-lumen  Placement Date/Time: (c) (c)   Placed prior to admission: No  Orientation: Right  Access Location: Subclavian  Hemodialysis Catheter Type: Double-lumen  Site Condition No complications  Blue Lumen Status Infusing  Red Lumen Status Infusing  Purple Lumen Status N/A  Dressing Type Biopatch  Dressing Status Clean;Dry;Intact  Drainage Description None

## 2018-07-25 NOTE — Progress Notes (Signed)
HD Tx End   07/25/18 1312  Hand-Off documentation  Report given to (Full Name) Lum Babe RN  Report received from (Full Name) Trellis Paganini RN  Vital Signs  Temp 97.7 F (36.5 C)  Temp Source Oral  Pulse Rate 80  Pulse Rate Source Monitor  Resp (!) 27  BP (!) 162/74  BP Location Left Leg  BP Method Automatic  Patient Position (if appropriate) Lying  Oxygen Therapy  SpO2 100 %  O2 Device Room Air  Pain Assessment  Pain Scale 0-10  Pain Score 0  Dialysis Weight  Weight 65 kg  Type of Weight Post-Dialysis  During Hemodialysis Assessment  Blood Flow Rate (mL/min) 200 mL/min  Arterial Pressure (mmHg) -180 mmHg  Venous Pressure (mmHg) 160 mmHg  Transmembrane Pressure (mmHg) 40 mmHg  Ultrafiltration Rate (mL/min) 700 mL/min  Dialysate Flow Rate (mL/min) 800 ml/min  Conductivity: Machine  14  HD Safety Checks Performed Yes  Dialysis Fluid Bolus Normal Saline  Bolus Amount (mL) 250 mL  Intra-Hemodialysis Comments Tx completed  Hemodialysis Catheter Right Subclavian Double-lumen  Placement Date/Time: (c) (c)   Placed prior to admission: No  Orientation: Right  Access Location: Subclavian  Hemodialysis Catheter Type: Double-lumen  Site Condition No complications  Blue Lumen Status Flushed;Saline locked;Capped (Central line);Heparin locked  Red Lumen Status Flushed;Saline locked;Capped (Central line);Heparin locked  Catheter fill solution Heparin 1000 units/ml  Catheter fill volume (Arterial) 1.5 cc (1.5)  Catheter fill volume (Venous) 1.5  Dressing Type Biopatch  Dressing Status Clean;Dry;Intact  Post treatment catheter status Capped and Clamped

## 2018-07-25 NOTE — Progress Notes (Signed)
Johnny [name] at [place] NAME: Johnny [name]    MR#:  [id number]  DATE OF BIRTH:  27-Sep-1950  SUBJECTIVE:   Patient see during HD   REVIEW OF SYSTEMS:    Review of Systems  Constitutional: Negative for fever, chills weight loss HENT: Negative for ear pain, nosebleeds, congestion, facial swelling, rhinorrhea, neck pain, neck stiffness and ear discharge.   Respiratory: Negative for cough, shortness of breath, wheezing  Cardiovascular: Negative for chest pain, palpitations and leg swelling.  Gastrointestinal: Negative for heartburn, abdominal pain, vomiting, diarrhea or consitpation Genitourinary: Negative for dysuria, urgency, frequency, hematuria Musculoskeletal: Negative for back pain or joint pain Neurological: Negative for dizziness, seizures, syncope, focal weakness,  numbness and headaches.  Hematological: Does not bruise/bleed easily.  Psychiatric/Behavioral: Negative for hallucinations, + confusion,no dysphoric mood  SKIN: lower right leg ulcer wrapped  Tolerating Diet: yes     DRUG ALLERGIES:   Allergies  Allergen Reactions  . Shellfish Allergy Anaphylaxis    VITALS:  Blood pressure (!) 159/81, pulse 73, temperature 98.5 F (36.9 C), temperature source Oral, resp. rate 10, height 5\' 8"  (1.727 m), weight 66.5 kg, SpO2 97 %.  PHYSICAL EXAMINATION:  Constitutional: Appears well-developed and well-nourished. No distress. HENT: Normocephalic. [name] Oropharynx is clear and moist.  Eyes: Conjunctivae  are normal.  no scleral icterus.  Neck: Normal ROM. Neck supple. No JVD. No tracheal deviation. CVS: RRR, S1/S2 +, no murmurs, no gallops, no carotid bruit.  Pulmonary: Effort and breath sounds normal, no stridor, rhonchi, wheezes, rales.  Abdominal: Soft. BS +,  no distension, tenderness, rebound or guarding.  Musculoskeletal: Normal range of motion.  Chronic right arm swelling Neuro: Alert. CN 2-12 grossly intact. No focal  deficits. Skin:ulcer is wrapped Psychiatric: normal mood      LABORATORY PANEL:   CBC Recent Labs  Lab 07/25/18 0610  WBC 19.9*  HGB 10.9*  HCT 36.4*  PLT 321   ------------------------------------------------------------------------------------------------------------------  Chemistries  Recent Labs  Lab 07/21/18 1343  07/25/18 0610  NA 139   < > 138  K 5.2*   < > 4.6  CL 103   < > 95*  CO2 19*   < > 26  GLUCOSE 88   < > 91  BUN 59*   < > 59*  CREATININE 10.04*   < > 8.76*  CALCIUM 10.1   < > 9.4  AST 11*  --   --   ALT 7  --   --   ALKPHOS 70  --   --   BILITOT 1.1  --   --    < > = values in this interval not displayed.   ------------------------------------------------------------------------------------------------------------------  Cardiac Enzymes No results for input(s): TROPONINI in the last 168 hours. ------------------------------------------------------------------------------------------------------------------  RADIOLOGY:  No results found.   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  1.  Lower limb right calf ulcer with severe peripheral arterial disease/gangrene Postoperative day #2 PTA balloon angioplastyof rt popliteal , rt posterior Tibial and tibioperoneal trunk,proximal SFA, throbolytic therapy for to rt SFA, popliteal and PTA, mechanical thrombectomy to all the vessles and stent placement to mid SFA and popliteal artery.  ID has recommended ceftaz amine as outpatient 2 g for 2 weeks until follow-up with vascular and decision made regarding  need for amputation.  Have discussed with nephrology as well as clinical social worker.  2.  End-stage renal disease on hemodialysis: Continue dialysis Monday, Wednesday and  Friday..  3.  Essential hypertension: Blood pressure low this morning.  All blood pressure medications on hold this morning.  Reevaluate blood pressure later this afternoon.  4.  Hyperlipidemia: Continue statin  5.  History of peripheral vascular disease: Continue Eliquis and statin  6.  History of gout: Continue colchicine prn  7.  Chronic diastolic heart failure: No signs of exacerbation   Patient to be discharged tomorrow to skilled nursing facility once we have approval with insurance.   Management plans discussed with the patient and he is in agreement.  CODE STATUS: Full  TOTAL TIME TAKING CARE OF THIS PATIENT: 24 minutes.     POSSIBLE D/C to[place]  Bettey Costa M.D on 07/25/2018 at 12:10 PM  Between 7am to 6pm - Pager - [phone] After 6pm go to [email or web address] - password [email or web address] [facility]  Office  [phone]  CC: Primary care physician; [name], MD  Note: This dictation was prepared with Dragon dictation along with smaller phrase technology. Any transcriptional errors that result from this process are unintentional.

## 2018-07-25 NOTE — Progress Notes (Signed)
Pre HD Tx   07/25/18 0930  Hand-Off documentation  Report given to (Full Name) Trellis Paganini RN   Report received from (Full Name) Lum Babe RN  Vital Signs  Temp 98.5 F (36.9 C)  Temp Source Oral  Pulse Rate 67  Pulse Rate Source Monitor  Resp 18  BP (!) 102/33  BP Location Left Leg  BP Method Automatic  Patient Position (if appropriate) Lying  Oxygen Therapy  SpO2 97 %  O2 Device Room Air  Pain Assessment  Pain Scale 0-10  Pain Score 0  Dialysis Weight  Weight 66.5 kg  Type of Weight Pre-Dialysis  Time-Out for Hemodialysis  What Procedure? Hemodialysis  Pt Identifiers(min of two) First/Last Name;MRN/Account#  Correct Site? Yes  Correct Side? Yes  Correct Procedure? Yes  Consents Verified? Yes  Rad Studies Available? N/A  Safety Precautions Reviewed? Yes  Engineer, civil (consulting) Number 2  Station Number 1  UF/Alarm Test Passed  Conductivity: Meter 14  Conductivity: Machine  13.8  pH 7.4  Reverse Osmosis main  Normal Saline Lot Number [id number]  Dialyzer Lot Number [id number]  Disposable Set Lot Number [id number]  Machine Temperature 98.6 F (37 C)  Musician and Audible Yes  Blood Lines Intact and Secured Yes  Pre Treatment Patient Checks  Vascular access used during treatment Catheter  Hepatitis B Surface Antigen Results Negative  Date Hepatitis B Surface Antigen Drawn 01/29/18  Hepatitis B Surface Antibody  (>10)  Date Hepatitis B Surface Antibody Drawn 01/29/18  Hemodialysis Consent Verified Yes  Hemodialysis Standing Orders Initiated Yes  ECG (Telemetry) Monitor On Yes  Prime Ordered Normal Saline  Length of  DialysisTreatment -hour(s) 3.5 Hour(s)  Dialysis Treatment Comments Na140  Dialyzer Elisio 17H NR  Dialysate 2K, 2.5 Ca  Dialysate Flow Ordered 800  Blood Flow Rate Ordered 400 mL/min  Ultrafiltration Goal 1.5 Liters  Dialysis Blood Pressure Support Ordered Normal Saline  Education / Care Plan  Dialysis Education Provided Yes   Documented Education in Care Plan Yes  Hemodialysis Catheter Right Subclavian Double-lumen  Placement Date/Time: (c) (c)   Placed prior to admission: No  Orientation: Right  Access Location: Subclavian  Hemodialysis Catheter Type: Double-lumen  Site Condition No complications  Blue Lumen Status Capped (Central line)  Red Lumen Status Capped (Central line)  Purple Lumen Status N/A  Dressing Type Biopatch  Dressing Status Clean;Dry;Intact  Drainage Description None

## 2018-07-25 NOTE — Progress Notes (Signed)
Post HD Assessment  1594mL net removal during HD tx.    07/25/18 1316  Neurological  Level of Consciousness Alert  Orientation Level Disoriented to place;Disoriented to situation;Oriented to time;Oriented to person  Respiratory  Respiratory Pattern Regular  Chest Assessment Chest expansion symmetrical  Bilateral Breath Sounds Clear;Diminished  Cardiac  Pulse Regular  Heart Sounds S1, S2  Jugular Venous Distention (JVD) No  ECG Monitor Yes  Cardiac Rhythm NSR  Vascular  R Radial Pulse +2  L Radial Pulse +2  Edema Right lower extremity;Left upper extremity;Generalized  Integumentary  Integumentary (WDL) X  Skin Color Appropriate for ethnicity  Skin Condition Dry  Skin Integrity Surgical Incision (see LDA);Other (Comment)  Cellulitis Location Leg  Cellulitis Location Orientation Right;Lower  Musculoskeletal  Musculoskeletal (WDL) X  Generalized Weakness Yes  GU Assessment  Genitourinary (WDL) X  Genitourinary Symptoms Other (Comment) (HD pt)  Psychosocial  Psychosocial (WDL) WDL

## 2018-07-25 NOTE — Progress Notes (Signed)
PT Cancellation Note  Patient Details Name: Johnny [name]. MRN: [id number] DOB: 03-12-1951   Cancelled Treatment:    Reason Eval/Treat Not Completed: Patient at procedure or test/unavailable.  Pt currently off unit at dialysis.  Will re-attempt PT treatment session at a later date/time.  Leitha Bleak, PT 07/25/18, 10:03 AM [phone]

## 2018-07-25 NOTE — NC FL2 (Signed)
[place] LEVEL OF CARE SCREENING TOOL     IDENTIFICATION  Patient Name: Johnny [name]. Birthdate: Jan 13, 1951 Sex: male Admission Date (Current Location): 07/21/2018  [place] and [place] Number:  Engineering geologist and Address:  [facility], [address], [place], [place] [address]      Provider Number: [id number]  Attending Physician Name and Address:  Bettey Costa, MD  Relative Name and Phone Number:       Current Level of Care: Hospital Recommended Level of Care: [place] Prior Approval Number:    Date Approved/Denied:   PASRR Number:    Discharge Plan: SNF    Current Diagnoses: Patient Active Problem List   Diagnosis Date Noted  . Cellulitis of right leg 07/21/2018  . Lower limb ulcer, calf, right, limited to breakdown of skin ([place]) 06/08/2018  . Malnutrition of moderate degree 04/11/2018  . Pressure injury of skin 04/06/2018  . Altered mental status 04/04/2018  . Hypothermia 04/04/2018  . Hyperlipidemia 02/27/2018  . Diabetes ([place]) 02/27/2018  . Weakness of right lower extremity 01/20/2018  . Fever   . Periumbilical abdominal pain   . Confusion 12/22/2017  . Acute delirium 12/21/2017  . Protein-calorie malnutrition, severe 12/19/2017  . Intractable nausea and vomiting 12/18/2017  . Lymphedema 12/13/2017  . Cellulitis 11/27/2017  . Chest pain 11/19/2017  . Atherosclerosis of native arteries of the extremities with ulceration ([place]) 11/07/2017  . Elevated troponin 10/02/2015  . Complications, dialysis, catheter, mechanical ([place]) 10/02/2015  . Musculoskeletal chest pain 09/28/2015  . ESRD on dialysis ([place]) 09/28/2015  . HTN (hypertension) 09/28/2015  . Chronic diastolic CHF (congestive heart failure) ([place]) 09/28/2015  . Gout 09/28/2015    Orientation RESPIRATION BLADDER Height & Weight     Self  Normal Incontinent Weight: 146 lb 9.7 oz (66.5 kg) Height:  5\' 8"  (172.7 cm)  BEHAVIORAL  SYMPTOMS/MOOD NEUROLOGICAL BOWEL NUTRITION STATUS  (none) (none) Incontinent Diet(renal carb modified)  AMBULATORY STATUS COMMUNICATION OF NEEDS Skin   Extensive Assist Verbally PU Stage and Appropriate Care(stage 2 on coccyx)                       Personal Care Assistance Level of Assistance  Bathing, Dressing Bathing Assistance: Maximum assistance   Dressing Assistance: Maximum assistance     Functional Limitations Info  (no issues)          SPECIAL CARE FACTORS FREQUENCY  PT (By licensed PT)(outpatient dialysis)                    Contractures Contractures Info: Not present    Additional Factors Info  Code Status Code Status Info: full             Current Medications (07/25/2018):  This is the current hospital active medication list Current Facility-Administered Medications  Medication Dose Route Frequency Provider Last Rate Last Dose  . 0.9 %  sodium chloride infusion   Intravenous PRN Bettey Costa, MD   Stopped at 07/24/18 0910  . acetaminophen (TYLENOL) tablet 650 mg  650 mg Oral Q6H PRN Algernon Huxley, MD       Or  . acetaminophen (TYLENOL) suppository 650 mg  650 mg Rectal Q6H PRN Algernon Huxley, MD      . allopurinol (ZYLOPRIM) tablet 100 mg  100 mg Oral Daily Algernon Huxley, MD   100 mg at 07/25/18 0818  . amLODipine (NORVASC) tablet 10 mg  10 mg  Oral Daily Algernon Huxley, MD   Stopped at 07/24/18 [phone]  . apixaban (ELIQUIS) tablet 2.5 mg  2.5 mg Oral BID Dallie Piles, RPH   2.5 mg at 07/25/18 0818  . aspirin EC tablet 81 mg  81 mg Oral Daily Algernon Huxley, MD   81 mg at 07/25/18 0818  . atorvastatin (LIPITOR) tablet 10 mg  10 mg Oral Daily Algernon Huxley, MD   10 mg at 07/25/18 0819  . bisacodyl (DULCOLAX) EC tablet 5 mg  5 mg Oral Daily PRN Algernon Huxley, MD      . calcitRIOL (ROCALTROL) capsule 0.5 mcg  0.5 mcg Oral Daily Algernon Huxley, MD   0.5 mcg at 07/25/18 0819  . calcium acetate (PHOSLO) capsule 1,334 mg  1,334 mg Oral TID WC Algernon Huxley, MD   1,334  mg at 07/25/18 0817  . cefTAZidime (FORTAZ) 1 g in sodium chloride 0.9 % 100 mL IVPB  1 g Intravenous Q24H Lu Duffel, RPH      . Chlorhexidine Gluconate Cloth 2 % PADS 6 each  6 each Topical Q0600 Algernon Huxley, MD   6 each at 07/25/18 0510  . Chlorhexidine Gluconate Cloth 2 % PADS 6 each  6 each Topical Daily Bettey Costa, MD   6 each at 07/25/18 [phone]  . clopidogrel (PLAVIX) tablet 75 mg  75 mg Oral Daily Algernon Huxley, MD   75 mg at 07/25/18 0818  . colchicine tablet 0.6 mg  0.6 mg Oral Daily PRN Algernon Huxley, MD      . diphenhydrAMINE (BENADRYL) capsule 50 mg  50 mg Oral Once Algernon Huxley, MD       Or  . diphenhydrAMINE (BENADRYL) injection 50 mg  50 mg Intravenous Once Algernon Huxley, MD      . docusate sodium (COLACE) capsule 100 mg  100 mg Oral BID Algernon Huxley, MD   100 mg at 07/25/18 0818  . gabapentin (NEURONTIN) capsule 100 mg  100 mg Oral QHS Mody, Sital, MD      . hydrALAZINE (APRESOLINE) injection 10 mg  10 mg Intravenous Q6H PRN Mody, Sital, MD      . HYDROcodone-acetaminophen (NORCO/VICODIN) 5-325 MG per tablet 1 tablet  1 tablet Oral Q4H PRN Algernon Huxley, MD   1 tablet at 07/24/18 1609  . methylPREDNISolone sodium succinate (SOLU-MEDROL) 40 mg/mL injection 40 mg  40 mg Intravenous Once Algernon Huxley, MD      . metoprolol succinate (TOPROL-XL) 24 hr tablet 25 mg  25 mg Oral QHS Algernon Huxley, MD   25 mg at 07/24/18 2109  . mupirocin ointment (BACTROBAN) 2 % 1 application  1 application Nasal BID Bettey Costa, MD   1 application at 85/46/27 0821  . ondansetron (ZOFRAN) tablet 4 mg  4 mg Oral Q6H PRN Algernon Huxley, MD       Or  . ondansetron [facility]) injection 4 mg  4 mg Intravenous Q6H PRN Algernon Huxley, MD   4 mg at 07/22/18 0517  . ondansetron (ZOFRAN) injection 4 mg  4 mg Intravenous Q6H PRN Algernon Huxley, MD   4 mg at 07/24/18 2109  . pregabalin (LYRICA) capsule 50 mg  50 mg Oral Daily Algernon Huxley, MD   50 mg at 07/25/18 0819  . sevelamer carbonate (RENVELA) tablet 2,400 mg   2,400 mg Oral TID WC Algernon Huxley, MD   2,400 mg at 07/25/18  [id number]  . traZODone (DESYREL) tablet 25 mg  25 mg Oral QHS PRN Algernon Huxley, MD   25 mg at 07/22/18 2329     Discharge Medications: Please see discharge summary for a list of discharge medications.  Relevant Imaging Results:  Relevant Lab Results:   Additional Information ss: [id number]  Shela Leff, LCSW

## 2018-07-25 NOTE — TOC Initial Note (Signed)
Transition of Care (TOC) - Initial/Assessment Note    Patient Details  Name: Johnny [name]. MRN: [id number] Date of Birth: [birth year]  Transition of Care [facility]) CM/SW Contact:    Shela Leff, LCSW Phone Number: 07/25/2018, 4:21 PM  Clinical Narrative:                CSW spoke with patient's representative, [name], whom CSW has talked to in patient's past admissions. [name] was aware that we were going to need to search for short term rehab. Patient has been to [facility] multiple times in the past and they were able to offer a bed this time. Tina at [facility] is aware and will begin [place].    Expected Discharge Plan: Skilled Nursing Facility Barriers to Discharge: Insurance Authorization   Patient Goals and CMS Choice Patient states their goals for this hospitalization and ongoing recovery are:: patient confused  CMS Medicare.gov Compare Post Acute Care list provided to:: Patient Represenative (must comment)(Brenda Glass: [phone]) Choice offered to / list presented to : (patient representative)  Expected Discharge Plan and Services Expected Discharge Plan: [place]     Living arrangements for the past 2 months: Single Family Home Expected Discharge Date: 07/23/18                        Prior Living Arrangements/Services Living arrangements for the past 2 months: Single Family Home Lives with:: Self Patient language and need for interpreter reviewed:: Yes Do you feel safe going back to the place where you live?: Yes      Need for Family Participation in Patient Care: Yes (Comment) Care giver support system in place?: No (comment)   Criminal Activity/Legal Involvement Pertinent to Current Situation/Hospitalization: No - Comment as needed  Activities of Daily Living Home Assistive Devices/Equipment: Blood pressure cuff, Eyeglasses, Walker (specify type), Cane (specify quad or straight), Grab bars in shower, Shower chair without back, Wheelchair, Dentures  (specify type) ADL Screening (condition at time of admission) Patient's cognitive ability adequate to safely complete daily activities?: Yes Is the patient deaf or have difficulty hearing?: No Does the patient have difficulty seeing, even when wearing glasses/contacts?: No Does the patient have difficulty concentrating, remembering, or making decisions?: No Patient able to express need for assistance with ADLs?: Yes Does the patient have difficulty dressing or bathing?: No Independently performs ADLs?: Yes (appropriate for developmental age) Does the patient have difficulty walking or climbing stairs?: Yes Weakness of Legs: Both Weakness of Arms/Hands: None  Permission Sought/Granted                  Emotional Assessment Appearance:: Appears older than stated age     Orientation: : Oriented to Self Alcohol / Substance Use: Not Applicable Psych Involvement: No (comment)  Admission diagnosis:  Wound infection [T14.8XXA, L08.9] Cellulitis of right lower extremity [L03.115] Patient Active Problem List   Diagnosis Date Noted  . Cellulitis of right leg 07/21/2018  . Lower limb ulcer, calf, right, limited to breakdown of skin ([place]) 06/08/2018  . Malnutrition of moderate degree 04/11/2018  . Pressure injury of skin 04/06/2018  . Altered mental status 04/04/2018  . Hypothermia 04/04/2018  . Hyperlipidemia 02/27/2018  . Diabetes ([place]) 02/27/2018  . Weakness of right lower extremity 01/20/2018  . Fever   . Periumbilical abdominal pain   . Confusion 12/22/2017  . Acute delirium 12/21/2017  . Protein-calorie malnutrition, severe 12/19/2017  . Intractable nausea and vomiting 12/18/2017  . Lymphedema 12/13/2017  .  Cellulitis 11/27/2017  . Chest pain 11/19/2017  . Atherosclerosis of native arteries of the extremities with ulceration ([place]) 11/07/2017  . Elevated troponin 10/02/2015  . Complications, dialysis, catheter, mechanical ([place]) 10/02/2015  . Musculoskeletal chest pain  09/28/2015  . ESRD on dialysis ([place]) 09/28/2015  . HTN (hypertension) 09/28/2015  . Chronic diastolic CHF (congestive heart failure) ([place]) 09/28/2015  . Gout 09/28/2015   PCP:  [name], MD Pharmacy:   [place], [place] - [address] [place] [address] Phone: [phone] Fax: [phone]     Social Determinants of Health ([place]) Interventions    Readmission Risk Interventions 30 Day Unplanned Readmission Risk Score     ED to Hosp-Admission (Current) from 07/21/2018 in [place]  30 Day Unplanned Readmission Risk Score (%)  37 Filed at 07/25/2018 1600     This score is the patient's risk of an unplanned readmission within 30 days of being discharged (0 -100%). The score is based on dignosis, age, lab data, medications, orders, and past utilization.   Low:  0-14.9   Medium: 15-21.9   High: 22-29.9   Extreme: 30 and above       No flowsheet data found.

## 2018-07-25 NOTE — Consult Note (Addendum)
Pharmacy Antibiotic Note  Johnny [name]. is a 68 y.o. male admitted on 07/21/2018 with wound infection/cellulitis  Pharmacy has been consulted for Ceftazidime dosing.  Plan: Will administer Ceftazidime IV 1g q24 hours after HD on HD days  Per ID recommendation, may change to 1 1/2g MWF @ HD once discharged.    Height: 5\' 8"  (172.7 cm) Weight: 142 lb 10.2 oz (64.7 kg) IBW/kg (Calculated) : 68.4  Temp (24hrs), Avg:98.1 F (36.7 C), Min:97.7 F (36.5 C), Max:98.9 F (37.2 C)  Recent Labs  Lab 07/21/18 1343 07/22/18 0457 07/22/18 1838 07/23/18 1028 07/24/18 0315 07/25/18 0610  WBC 17.9* 15.8*  --  18.8* 26.6* 19.9*  CREATININE 10.04* 10.99* 12.14* 13.26* 7.40* 8.76*    Estimated Creatinine Clearance: 7.3 mL/min (A) (by C-G formula based on SCr of 8.76 mg/dL (H)).    Allergies  Allergen Reactions  . [place] Allergy Anaphylaxis    Antimicrobials this admission: 3/12 Vancomycin >> 3/18 3/14 Zosyn >> 3/18 3/18 Ceftazadime >>  Dose adjustments this admission: None at this time  Microbiology results: 3/14 BCx: NG x 4 days  3/14 Wound Culture   - Ceftazidime sensitive Abundant Enterobacter Cloacae, Pseudomonas  Thank you for allowing pharmacy to be a part of this patient's care.  Lu Duffel, PharmD, BCPS Clinical Pharmacist 07/25/2018 9:00 AM

## 2018-07-25 NOTE — Progress Notes (Signed)
[facility], [place] 07/25/18  Subjective:  Pt seen at bedside during dialysis. Tolerating well.    HEMODIALYSIS FLOWSHEET:  Blood Flow Rate (mL/min): 400 mL/min Arterial Pressure (mmHg): -160 mmHg Venous Pressure (mmHg): 180 mmHg Transmembrane Pressure (mmHg): 40 mmHg Ultrafiltration Rate (mL/min): 430 mL/min Dialysate Flow Rate (mL/min): 800 ml/min Conductivity: Machine : 14 Conductivity: Machine : 14 Dialysis Fluid Bolus: Normal Saline Bolus Amount (mL): 250 mL    Objective:  Vital signs in last 24 hours:  Temp:  [97.7 F (36.5 C)-98.9 F (37.2 C)] 98.5 F (36.9 C) (03/18 0930) Pulse Rate:  [66-76] 69 (03/18 1000) Resp:  [14-18] 14 (03/18 1000) BP: (102-146)/(33-91) 127/73 (03/18 1000) SpO2:  [97 %-100 %] 97 % (03/18 1000) Weight:  [66.5 kg] 66.5 kg (03/18 0930)  Weight change:  Filed Weights   07/23/18 1245 07/24/18 0517 07/25/18 0930  Weight: 63.7 kg 64.7 kg 66.5 kg    Intake/Output:    Intake/Output Summary (Last 24 hours) at 07/25/2018 1014 Last data filed at 07/25/2018 0516 Gross per 24 hour  Intake 526.33 ml  Output 0 ml  Net 526.33 ml     Physical Exam: General:  Chronically ill-appearing, laying in the bed  HEENT  anicteric, moist oral mucous membranes  Neck  supple  Pulm/lungs  normal breathing effort, clear to auscultation  CVS/Heart  S1S2 rubs  Abdomen:   Soft, nontender, BS present  Extremities:  Right arm edema around the new AV graft  Neurologic:  Alert, follows commands  Skin:  Wound in the right leg and mid shin area  Access:  Right IJ PermCath, right upper arm AV graft-newly placed       Basic Metabolic Panel:  Recent Labs  Lab 07/22/18 0457 07/22/18 1838 07/23/18 1028 07/24/18 0315 07/25/18 0610  NA 140 141 140 137 138  K 5.2* 5.0 6.2* 4.6 4.6  CL 102 105 103 97* 95*  CO2 20* 20* 19* 26 26  GLUCOSE 98 107* 95 121* 91  BUN 69* 75* 85* 42* 59*  CREATININE 10.99* 12.14* 13.26* 7.40* 8.76*   CALCIUM 9.9 9.4 9.8 9.0 9.4  PHOS  --   --  8.0*  --   --      CBC: Recent Labs  Lab 07/21/18 1343 07/22/18 0457 07/23/18 1028 07/24/18 0315 07/25/18 0610  WBC 17.9* 15.8* 18.8* 26.6* 19.9*  NEUTROABS 14.4*  --   --   --   --   HGB 10.8* 10.2* 10.8* 10.3* 10.9*  HCT 34.4* 33.7* 34.6* 33.5* 36.4*  MCV 95.6 97.7 96.4 96.0 97.8  PLT 283 283 364 377 321      Lab Results  Component Value Date   HEPBSAG Negative 09/30/2015   HEPBSAB Reactive 01/29/2018      Microbiology:  Recent Results (from the past 240 hour(s))  Surgical pcr screen     Status: Abnormal   Collection Time: 07/17/18  2:49 PM  Result Value Ref Range Status   MRSA, PCR NEGATIVE NEGATIVE Final   Staphylococcus aureus POSITIVE (A) NEGATIVE Final    Comment: (NOTE) The Xpert SA Assay (FDA approved for NASAL specimens in patients 18 years of age and older), is one component of a comprehensive surveillance program. It is not intended to diagnose infection nor to guide or monitor treatment. Performed at [facility], [address] [place], [place] [address]   Wound or Superficial Culture     Status: None (Preliminary result)   Collection Time: 07/21/18  2:40 PM  Result Value Ref Range Status   Specimen Description   Final    WOUND Performed at [facility], [address] [place], [place] [address]    Special Requests   Final    NONE Performed at [facility], [address] [place], [place] [address]    Gram Stain   Final    FEW WBC PRESENT, PREDOMINANTLY PMN ABUNDANT GRAM NEGATIVE RODS ABUNDANT GRAM POSITIVE COCCI IN PAIRS IN CLUSTERS    Culture   Final    ABUNDANT ENTEROBACTER CLOACAE ABUNDANT PSEUDOMONAS AERUGINOSA SUSCEPTIBILITIES TO FOLLOW Performed at [facility], [place] [address], [place], [place] [address]    Report Status PENDING  Incomplete   Organism ID, Bacteria ENTEROBACTER CLOACAE  Final      Susceptibility   Enterobacter cloacae - MIC*     CEFAZOLIN >=64 RESISTANT Resistant     CEFEPIME <=1 SENSITIVE Sensitive     CEFTAZIDIME <=1 SENSITIVE Sensitive     CEFTRIAXONE <=1 SENSITIVE Sensitive     CIPROFLOXACIN <=0.25 SENSITIVE Sensitive     GENTAMICIN <=1 SENSITIVE Sensitive     IMIPENEM 1 SENSITIVE Sensitive     TRIMETH/SULFA <=20 SENSITIVE Sensitive     PIP/TAZO 8 SENSITIVE Sensitive     * ABUNDANT ENTEROBACTER CLOACAE  Blood culture (single)     Status: None (Preliminary result)   Collection Time: 07/21/18  5:35 PM  Result Value Ref Range Status   Specimen Description BLOOD LFOA  Final   Special Requests   Final    BOTTLES DRAWN AEROBIC AND ANAEROBIC Blood Culture adequate volume   Culture   Final    NO GROWTH 4 DAYS Performed at [facility], [address] [place], [place] [address]    Report Status PENDING  Incomplete    Coagulation Studies: No results for input(s): LABPROT, INR in the last 72 hours.  Urinalysis: No results for input(s): COLORURINE, LABSPEC, PHURINE, GLUCOSEU, HGBUR, BILIRUBINUR, KETONESUR, PROTEINUR, UROBILINOGEN, NITRITE, LEUKOCYTESUR in the last 72 hours.  Invalid input(s): APPERANCEUR    Imaging: No results found.   Medications:   . sodium chloride Stopped (07/24/18 0910)  . cefTAZidime (FORTAZ)  IV     . allopurinol  100 mg Oral Daily  . amLODipine  10 mg Oral Daily  . apixaban  2.5 mg Oral BID  . aspirin EC  81 mg Oral Daily  . atorvastatin  10 mg Oral Daily  . calcitRIOL  0.5 mcg Oral Daily  . calcium acetate  1,334 mg Oral TID WC  . Chlorhexidine Gluconate Cloth  6 each Topical Q0600  . Chlorhexidine Gluconate Cloth  6 each Topical Daily  . clopidogrel  75 mg Oral Daily  . diphenhydrAMINE  50 mg Oral Once   Or  . diphenhydrAMINE  50 mg Intravenous Once  . docusate sodium  100 mg Oral BID  . gabapentin  300 mg Oral QHS  . methylPREDNISolone (SOLU-MEDROL) injection  40 mg Intravenous Once  . metoprolol succinate  25 mg Oral QHS  . mupirocin ointment  1  application Nasal BID  . pregabalin  50 mg Oral Daily  . sevelamer carbonate  2,400 mg Oral TID WC   sodium chloride, acetaminophen **OR** acetaminophen, bisacodyl, colchicine, hydrALAZINE, HYDROcodone-acetaminophen, ondansetron **OR** ondansetron (ZOFRAN) IV, ondansetron (ZOFRAN) IV, traZODone  Assessment/ Plan:  68 y.o. African-American male  with end stage renal disease on hemodialysis, congestive heart failure, hypertension, peripheral vascular disease  [facility] MWF Rt IJ pC 65 kg,  new AVG (Dr Lucky Cowboy 07/18/2018)  1. End-stage renal disease with mild hyperkalemia    2. Anemia of chronic kidney disease- Hgb 10.9 3. Secondary hyperparathyroidism- on binders 4.  Right lower extremity chronic wound  Plan: Patient seen and evaluated during HD.  BFR 400, UF target 1-1.5kg. Hgb currently 10.9, epogen on hold.  Phos was 8.0 at last check, will repeat phos today, but otherwise continue renvela.       LOS: 4 Whitaker Holderman 3/18/202010:14 AM  [place], [place]  Note: This note was prepared with Dragon dictation. Any transcription errors are unintentional

## 2018-07-25 NOTE — Progress Notes (Signed)
Pre HD Assessment   07/25/18 1145  Neurological  Level of Consciousness Alert  Orientation Level Disoriented to place;Disoriented to situation;Oriented to time;Oriented to person  Respiratory  Respiratory Pattern Regular  Chest Assessment Chest expansion symmetrical  Bilateral Breath Sounds Diminished;Clear  Cardiac  Pulse Regular  Heart Sounds S1, S2  Jugular Venous Distention (JVD) No  ECG Monitor Yes  Cardiac Rhythm NSR  Vascular  R Radial Pulse +2  L Radial Pulse +2  Edema Right lower extremity;Left upper extremity;Generalized  Integumentary  Integumentary (WDL) X  Skin Color Appropriate for ethnicity  Skin Condition Dry  Skin Integrity Surgical Incision (see LDA);Other (Comment)  Cellulitis Location Leg  Cellulitis Location Orientation Right;Lower  Additional Integumentary Comments  (HD pt)  Musculoskeletal  Musculoskeletal (WDL) X  Generalized Weakness Yes  Gastrointestinal  Additional Gastrointestinal Comments  (vomited x1 upon entering hemodialysis unit."feels better"now)  GU Assessment  Genitourinary (WDL) X  Genitourinary Symptoms Other (Comment) (HD pt)  Psychosocial  Psychosocial (WDL) WDL

## 2018-07-26 LAB — CULTURE, BLOOD (SINGLE)
Culture: NO GROWTH
Special Requests: ADEQUATE

## 2018-07-26 LAB — AEROBIC CULTURE W GRAM STAIN (SUPERFICIAL SPECIMEN)

## 2018-07-26 LAB — GLUCOSE, CAPILLARY
Glucose-Capillary: 78 mg/dL (ref 70–99)
Glucose-Capillary: 122 mg/dL — ABNORMAL HIGH (ref 70–99)

## 2018-07-26 MED ORDER — CLOPIDOGREL BISULFATE 75 MG PO TABS: 75.0000 mg | ORAL_TABLET | Freq: Every day | ORAL | Status: DC

## 2018-07-26 MED ORDER — GABAPENTIN 100 MG PO CAPS: 100.0000 mg | ORAL_CAPSULE | Freq: Every day | ORAL | Status: DC

## 2018-07-26 MED ORDER — VITAMIN C 500 MG PO TABS
500.0000 mg | ORAL_TABLET | Freq: Two times a day (BID) | ORAL | Status: DC
  Administered 2018-07-26: 500 mg via ORAL
  Filled 2018-07-26: qty 1

## 2018-07-26 MED ORDER — APIXABAN 2.5 MG PO TABS: 2.5000 mg | ORAL_TABLET | Freq: Two times a day (BID) | ORAL | 0 refills | Status: DC

## 2018-07-26 MED ORDER — RENA-VITE PO TABS: 1.0000 | ORAL_TABLET | Freq: Every day | ORAL | Status: DC

## 2018-07-26 MED ORDER — CLONAZEPAM 0.5 MG PO TABS: 0.5000 mg | ORAL_TABLET | ORAL | 0 refills | Status: DC | PRN

## 2018-07-26 MED ORDER — HYDROCODONE-ACETAMINOPHEN 5-325 MG PO TABS: 1.0000 | ORAL_TABLET | ORAL | 0 refills | Status: AC | PRN

## 2018-07-26 MED ORDER — NEPRO/CARBSTEADY PO LIQD
237.0000 mL | Freq: Two times a day (BID) | ORAL | Status: DC
  Administered 2018-07-26: 237 mL via ORAL

## 2018-07-26 MED ORDER — SODIUM CHLORIDE 0.9 % IV SOLN: 1.0000 g | INTRAVENOUS | Status: AC

## 2018-07-26 NOTE — Discharge Summary (Signed)
[facility] [place] NAME: Johnny [name]    MR#:  [id number]  DATE OF BIRTH:  06/11/1950  DATE OF ADMISSION:  07/21/2018 ADMITTING PHYSICIAN: Epifanio Lesches, MD  DATE OF DISCHARGE: 07/26/2018  PRIMARY CARE PHYSICIAN: [name], MD    ADMISSION DIAGNOSIS:  Wound infection [T14.8XXA, L08.9] Cellulitis of right lower extremity [L03.115]  DISCHARGE DIAGNOSIS:  Active Problems:   Cellulitis of right leg   SECONDARY DIAGNOSIS:   Past Medical History:  Diagnosis Date  . Anemia   . Anxiety   . CHF (congestive heart failure) ([place])   . Chronic kidney disease    esrd dialysis m/w/f  . Gout   . Hyperlipidemia   . Hypertension   . Myocardial infarction ([place]) 2010   10 years ago  . 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  . Peripheral vascular disease [facility])     HOSPITAL COURSE:  68 year old male with end-stage renal disease on hemodialysis, essential hypertension, chronic lower limb right calf ulcer status post skin graft who presents to the emergency room after wound care nurse noted foul-smelling odor from the wound.  1.  Lower limb right calf ulcer with severe peripheral arterial disease/gangrene Postoperative day #2 PTA balloon angioplastyof rt popliteal , rt posterior Tibial and tibioperoneal trunk,proximal SFA, throbolytic therapy for to rt SFA, popliteal and PTA, mechanical thrombectomy to all the vessles and stent placement to mid SFA and popliteal artery.  ID has recommended ceftazadime as outpatient for 2 weeks until follow-up with vascular and decision made regarding need for amputation.  This will be given on dialysis days.  I spoke with nephrologist about this.  2.  End-stage renal disease on hemodialysis: Continue dialysis Monday, Wednesday and Friday..  3.  Essential hypertension: Blood pressure has been low/normal.  We stopped all blood pressure medications with exception  of metoprolol at night.  Blood pressure needs to be monitored closely and he can be restarted on medications if indicated.    4.  Hyperlipidemia: Continue statin  5.  History of peripheral vascular disease: Continue Eliquis and statin  6.  History of gout: Continue Lopurin all   7.  Chronic diastolic heart failure: No signs of exacerbation   DISCHARGE CONDITIONS AND DIET:   Stable for discharge on renal diet  CONSULTS OBTAINED:  Treatment Team:  Murlean Iba, MD Juanna Cao, MD Tsosie Billing, MD  DRUG ALLERGIES:   Allergies  Allergen Reactions  . Shellfish Allergy Anaphylaxis    DISCHARGE MEDICATIONS:   Allergies as of 07/26/2018      Reactions   Shellfish Allergy Anaphylaxis      Medication List    STOP taking these medications   amLODipine 10 MG tablet Commonly known as:  NORVASC   aspirin EC 81 MG tablet   carvedilol 6.25 MG tablet Commonly known as:  COREG   colchicine 0.6 MG tablet   irbesartan 150 MG tablet Commonly known as:  AVAPRO     TAKE these medications   allopurinol 100 MG tablet Commonly known as:  ZYLOPRIM Take 100 mg by mouth daily.   apixaban 2.5 MG Tabs tablet Commonly known as:  ELIQUIS Take 1 tablet (2.5 mg total) by mouth 2 (two) times daily. What changed:  additional instructions   atorvastatin 10 MG tablet Commonly known as:  LIPITOR Take 1 tablet by mouth daily.   calcitRIOL 0.5 MCG capsule Commonly known as:  ROCALTROL Take 0.5 mcg by mouth  daily.   calcium acetate 667 MG capsule Commonly known as:  PHOSLO Take 1,334 mg by mouth 3 (three) times daily with meals.   cefTAZidime 1 g in sodium chloride 0.9 % 100 mL Inject 1 g into the vein daily for 14 days. To be give while in dialysis   clonazePAM 0.5 MG tablet Commonly known as:  KLONOPIN Take 1 tablet (0.5 mg total) by mouth as needed.   clopidogrel 75 MG tablet Commonly known as:  Plavix Take 1 tablet (75 mg total) by mouth daily.   folic  acid-vitamin b complex-vitamin c-selenium-zinc 3 MG Tabs tablet Take 1 tablet by mouth daily.   gabapentin 100 MG capsule Commonly known as:  NEURONTIN Take 1 capsule (100 mg total) by mouth at bedtime.   HYDROcodone-acetaminophen 5-325 MG tablet Commonly known as:  NORCO/VICODIN Take 1 tablet by mouth every 4 (four) hours as needed for up to 3 days for moderate pain.   metoprolol succinate 25 MG 24 hr tablet Commonly known as:  TOPROL-XL Take 25 mg by mouth at bedtime.         Today   CHIEF COMPLAINT:  No acute issues overnight   VITAL SIGNS:  Blood pressure (!) 94/54, pulse 93, temperature 98.9 F (37.2 C), temperature source Oral, resp. rate 18, height 5\' 8"  (1.727 m), weight 64.3 kg, SpO2 100 %.   REVIEW OF SYSTEMS:  Review of Systems  Constitutional: Negative.  Negative for chills, fever and malaise/fatigue.  HENT: Negative.  Negative for ear discharge, ear pain, hearing loss, nosebleeds and sore throat.   Eyes: Negative.  Negative for blurred vision and pain.  Respiratory: Negative.  Negative for cough, hemoptysis, shortness of breath and wheezing.   Cardiovascular: Negative.  Negative for chest pain, palpitations and leg swelling.  Gastrointestinal: Negative.  Negative for abdominal pain, blood in stool, diarrhea, nausea and vomiting.  Genitourinary: Negative.  Negative for dysuria.  Musculoskeletal: Negative.  Negative for back pain.  Neurological: Negative for dizziness, tremors, speech change, focal weakness, seizures and headaches.  Endo/Heme/Allergies: Negative.  Does not bruise/bleed easily.  Psychiatric/Behavioral: Negative.  Negative for depression, hallucinations and suicidal ideas.     PHYSICAL EXAMINATION:  GENERAL:  68 y.o.-year-old patient lying in the bed with no acute distress.  NECK:  Supple, no jugular venous distention. No thyroid enlargement, no tenderness.  LUNGS: Normal breath sounds bilaterally, no wheezing, rales,rhonchi  No use of  accessory muscles of respiration.  CARDIOVASCULAR: S1, S2 normal. No murmurs, rubs, or gallops.  ABDOMEN: Soft, non-tender, non-distended. Bowel sounds present. No organomegaly or mass.  EXTREMITIES: No pedal edema, cyanosis, or clubbing.  PSYCHIATRIC: The patient is alert and oriented x 3.  SKIN:ulcer  Wrapped leg Chronic right UE swelling DATA REVIEW:   CBC Recent Labs  Lab 07/25/18 0610  WBC 19.9*  HGB 10.9*  HCT 36.4*  PLT 321    Chemistries  Recent Labs  Lab 07/21/18 1343  07/25/18 0610  NA 139   < > 138  K 5.2*   < > 4.6  CL 103   < > 95*  CO2 19*   < > 26  GLUCOSE 88   < > 91  BUN 59*   < > 59*  CREATININE 10.04*   < > 8.76*  CALCIUM 10.1   < > 9.4  AST 11*  --   --   ALT 7  --   --   ALKPHOS 70  --   --   BILITOT 1.1  --   --    < > =  values in this interval not displayed.    Cardiac Enzymes No results for input(s): TROPONINI in the last 168 hours.  Microbiology Results  @MICRORSLT48 @  RADIOLOGY:  No results found.    Allergies as of 07/26/2018      Reactions   Shellfish Allergy Anaphylaxis      Medication List    STOP taking these medications   amLODipine 10 MG tablet Commonly known as:  NORVASC   aspirin EC 81 MG tablet   carvedilol 6.25 MG tablet Commonly known as:  COREG   colchicine 0.6 MG tablet   irbesartan 150 MG tablet Commonly known as:  AVAPRO     TAKE these medications   allopurinol 100 MG tablet Commonly known as:  ZYLOPRIM Take 100 mg by mouth daily.   apixaban 2.5 MG Tabs tablet Commonly known as:  ELIQUIS Take 1 tablet (2.5 mg total) by mouth 2 (two) times daily. What changed:  additional instructions   atorvastatin 10 MG tablet Commonly known as:  LIPITOR Take 1 tablet by mouth daily.   calcitRIOL 0.5 MCG capsule Commonly known as:  ROCALTROL Take 0.5 mcg by mouth daily.   calcium acetate 667 MG capsule Commonly known as:  PHOSLO Take 1,334 mg by mouth 3 (three) times daily with meals.   cefTAZidime  1 g in sodium chloride 0.9 % 100 mL Inject 1 g into the vein daily for 14 days. To be give while in dialysis   clonazePAM 0.5 MG tablet Commonly known as:  KLONOPIN Take 1 tablet (0.5 mg total) by mouth as needed.   clopidogrel 75 MG tablet Commonly known as:  Plavix Take 1 tablet (75 mg total) by mouth daily.   folic acid-vitamin b complex-vitamin c-selenium-zinc 3 MG Tabs tablet Take 1 tablet by mouth daily.   gabapentin 100 MG capsule Commonly known as:  NEURONTIN Take 1 capsule (100 mg total) by mouth at bedtime.   HYDROcodone-acetaminophen 5-325 MG tablet Commonly known as:  NORCO/VICODIN Take 1 tablet by mouth every 4 (four) hours as needed for up to 3 days for moderate pain.   metoprolol succinate 25 MG 24 hr tablet Commonly known as:  TOPROL-XL Take 25 mg by mouth at bedtime.          Management plans discussed with the patient and he is in agreement. Stable for discharge   Patient should follow up with dr dew  CODE STATUS:     Code Status Orders  (From admission, onward)         Start     Ordered   07/21/18 1534  Full code  Continuous     07/21/18 1535        Code Status History    Date Active Date Inactive Code Status Order ID Comments User Context   04/04/2018 1027 04/13/2018 2210 Full Code [id number]  Saundra Shelling, MD Inpatient   04/02/2018 1025 04/02/2018 1537 Full Code [id number]  Algernon Huxley, MD Inpatient   01/29/2018 1316 02/01/2018 2154 Full Code [id number]  Nicholes Mango, MD Inpatient   01/20/2018 1233 01/24/2018 2031 Full Code [id number]  MayoPete Pelt, MD Inpatient   01/18/2018 1230 01/19/2018 1018 Full Code [id number]  Algernon Huxley, MD Inpatient   12/19/2017 0112 12/25/2017 2152 Full Code [id number]  Lance Coon, MD Inpatient   11/27/2017 1831 12/01/2017 2320 Full Code [id number]  Saundra Shelling, MD Inpatient   11/19/2017 0507 11/21/2017 0009 Full Code [id number]  Harrie Foreman, MD Inpatient   11/16/2017 1205 11/16/2017 1717  Full Code [id number]   Algernon Huxley, MD Inpatient   01/28/2016 1050 01/28/2016 1456 Full Code [id number]  Algernon Huxley, MD Inpatient   09/28/2015 2306 10/02/2015 1738 Full Code [id number]  Lance Coon, MD Inpatient    Advance Directive Documentation     Most Recent Value  Type of Advance Directive  Healthcare Power of Attorney  Pre-existing out of facility DNR order (yellow form or pink MOST form)  -  "MOST" Form in Place?  -      TOTAL TIME TAKING CARE OF THIS PATIENT: 38 minutes.    Note: This dictation was prepared with Dragon dictation along with smaller phrase technology. Any transcriptional errors that result from this process are unintentional.  Bettey Costa M.D on 07/26/2018 at 10:10 AM  Between 7am to 6pm - Pager - [phone] After 6pm go to [email or web address] - password [email or web address] [facility]  Office  [phone]  CC: Primary care physician; [name], MD

## 2018-07-26 NOTE — Progress Notes (Signed)
Report given to Megan at [facility].  All questions answered. [phone]

## 2018-07-26 NOTE — Progress Notes (Signed)
Discharge to EMS. Report given. Patient alert. All questions answered. No further needs. Belongings and discharge instructions to EMS.

## 2018-07-26 NOTE — Progress Notes (Signed)
Physical Therapy Treatment Patient Details Name: Johnny [name]. MRN: [id number] DOB: 17-Oct-1950 Today's Date: 07/26/2018    History of Present Illness Pt is a 68 y.o. male presenting to hospital 07/21/18 with foul-smelling odor from R LE wound.  Pt with recent R upper arm AV graft 07/19/18.  Pt admitted with R calf lower limb chronic ulcer.  S/p angio 07/23/18 (PT continue at transfer order received).  PMH includes ESRD MWF, anemia, anxiety, CHF, htn, MI, neuromuscular disorder (neuropathy R LE), PVD, AV fistula placement L UE, h/o R calf skin graft 05/24/18.    PT Comments    Pt ready for session.  To edge of bed with min assist.  Session focused on squat pivot transfers to recliner at bedside.  Pt stated he relies on wheelchair for mobility in the home and does not walk.  While pt requires min assist for transfers, he continues with decreased safety awareness and remains a fall risk.  Discussed with Care Management who voices concerns over pt's ability to safely care for himself at home due to cognition and medical concerns.  Pt continues to require assist for mobility to ensure safety.  SNF remains appropriate for discharge.   Follow Up Recommendations  SNF     Equipment Recommendations       Recommendations for Other Services       Precautions / Restrictions Precautions Precautions: Fall Precaution Comments: No BP B UE's (take on L LE) Restrictions Weight Bearing Restrictions: Yes RLE Weight Bearing: Weight bearing as tolerated    Mobility  Bed Mobility Overal bed mobility: Needs Assistance Bed Mobility: Supine to Sit;Sit to Supine     Supine to sit: Min assist Sit to supine: Min assist      Transfers Overall transfer level: Needs assistance Equipment used: Rolling walker (2 wheeled) Transfers: Squat Pivot Transfers     Squat pivot transfers: Min assist     General transfer comment: verbal cues for hand placement and techniques  Ambulation/Gait             General Gait Details: pt reports not walking at home and relying on wheelchair for [employer] Mobility    Modified Rankin (Stroke Patients Only)       Balance Overall balance assessment: Needs assistance Sitting-balance support: No upper extremity supported;Feet supported Sitting balance-Leahy Scale: Good     Standing balance support: Bilateral upper extremity supported Standing balance-Leahy Scale: Poor Standing balance comment: pt requiring B UE support for static standing balance                            Cognition Arousal/Alertness: Awake/alert Behavior During Therapy: WFL for tasks assessed/performed;Flat affect Overall Cognitive Status: No family/caregiver present to determine baseline cognitive functioning                                        Exercises Other Exercises Other Exercises: BLE supine arom for SLR and heel slides.      General Comments        Pertinent Vitals/Pain Pain Assessment: Faces Faces Pain Scale: Hurts little more Pain Location: R lower leg Pain Descriptors / Indicators: Aching;Tender;Sore;Guarding Pain Intervention(s): Limited activity within patient's tolerance    Home Living  Prior Function            PT Goals (current goals can now be found in the care plan section) Progress towards PT goals: Progressing toward goals    Frequency    Min 2X/week      PT Plan Current plan remains appropriate    Co-evaluation              AM-PAC PT "6 Clicks" Mobility   Outcome Measure  Help needed turning from your back to your side while in a flat bed without using bedrails?: A Little Help needed moving from lying on your back to sitting on the side of a flat bed without using bedrails?: A Little Help needed moving to and from a bed to a chair (including a wheelchair)?: A Little Help needed standing up from a chair using your arms  (e.g., wheelchair or bedside chair)?: A Lot Help needed to walk in hospital room?: Total Help needed climbing 3-5 steps with a railing? : Total 6 Click Score: 13    End of Session Equipment Utilized During Treatment: Gait belt Activity Tolerance: Patient tolerated treatment well Patient left: with call bell/phone within reach;in chair;with chair alarm set Nurse Communication: Mobility status Pain - Right/Left: Right Pain - part of body: Leg     Time: 5573-2202 PT Time Calculation (min) (ACUTE ONLY): 15 min  Charges:  $Therapeutic Activity: 8-22 mins                     Chesley Noon, PTA 07/26/18, 1:15 PM

## 2018-07-26 NOTE — Progress Notes (Signed)
Vein & Vascular Surgery Daily Progress Note   Subjective: 3 Days Post-Op: 1.Ultrasound guidance for vascular accessleftfemoral artery 2.Catheter placement into right common femoral artery from left femoral approach 3.Aortogram and selectiverightlower extremity angiogramincluding selective images of the right posterior tibial and anterior tibial arteries 4.Percutaneous transluminal angioplasty ofright posterior tibial artery and tibioperoneal trunk with 2.5 mm diameter by 30 cm length angioplasty balloon 5.Percutaneous transluminal angioplasty of the right popliteal artery and distal and mid SFA with 5 mm diameter by 30 cm length Lutonix drug-coated angioplasty balloon 6.Percutaneous transluminal angioplasty of the proximal SFA with 6 mm diameter by 8 cm length Lutonix drug-coated angioplasty balloon 7.Catheter directed thrombolytic therapy with 6 mg of TPA to the right SFA, popliteal artery, and posterior tibial arteries 8.Mechanical thrombectomy with the penumbra cat 6 device to the right SFA, popliteal artery, tibioperoneal trunk, and posterior tibial arteries 9.Viabahn stent placement to the mid to distal SFA and popliteal artery with 6 mm diameter by 25 cm length stent 10.Percutaneous transluminal angioplasty of the right anterior tibial artery with 2.5 mm diameter by 30 cm length angioplasty balloon 11.StarClose closure device leftfemoral artery  Patient is being discharged to rehab today.  Patient states some soreness to the right lower extremity.  Objective: Vitals:   07/26/18 0500 07/26/18 0521 07/26/18 0800 07/26/18 0836  BP:  (!) 156/57 (!) 92/41 (!) 94/54  Pulse:  83 92 93  Resp:  18    Temp:  98.9 F (37.2 C)    TempSrc:  Oral    SpO2:  100% 99% 100%  Weight: 64.3 kg     Height:        Intake/Output  Summary (Last 24 hours) at 07/26/2018 1141 Last data filed at 07/26/2018 0900 Gross per 24 hour  Intake 340 ml  Output 1500 ml  Net -1160 ml   Physical Exam: A&Ox3, NAD CV: RRR Pulmonary: CTA Bilaterally Abdomen: Soft, Nontender, Nondistended, (+) Bowel Sounds Groin: Left Access: no drainage / swelling noted Vascular:             Right lower extremity: thigh soft, calf soft. Extremity warm distally. Hard to palpate pedal pulses.             Right calf wound: Skin graft has necrosed.  At this time, the wound is dry and without infection.  Surrounding skin is not erythematous.   Laboratory: CBC    Component Value Date/Time   WBC 19.9 (H) 07/25/2018 0610   HGB 10.9 (L) 07/25/2018 0610   HGB 8.8 (L) 01/17/2014 0502   HCT 36.4 (L) 07/25/2018 0610   HCT 28.1 (L) 02/28/2018 1432   PLT 321 07/25/2018 0610   PLT 212 01/17/2014 0502   BMET    Component Value Date/Time   NA 138 07/25/2018 0610   NA 139 01/17/2014 0502   K 4.6 07/25/2018 0610   K 4.8 01/17/2014 0502   CL 95 (L) 07/25/2018 0610   CL 102 01/17/2014 0502   CO2 26 07/25/2018 0610   CO2 27 01/17/2014 0502   GLUCOSE 91 07/25/2018 0610   GLUCOSE 101 (H) 01/17/2014 0502   BUN 59 (H) 07/25/2018 0610   BUN 62 (H) 01/17/2014 0502   CREATININE 8.76 (H) 07/25/2018 0610   CREATININE 12.30 (H) 01/17/2014 0502   CALCIUM 9.4 07/25/2018 0610   CALCIUM 8.2 (L) 01/17/2014 0502   GFRNONAA 6 (L) 07/25/2018 0610   GFRNONAA 4 (L) 01/17/2014 0502   GFRAA 6 (L) 07/25/2018 0610   GFRAA 4 (L)  01/17/2014 0502   Assessment/Planning: The patient is a 68 year old male with multiple medical issues, including severe PAD s/p endovascular intervention for ischemia POD#3 1) Exhausted options for further endovascular intervention. Patient with improved arterial patency however is at high right for amputation.  2) No plan for debridement in house. Will see patient in one week and assess skin graft site.  3) Patient can be discharged home when  medically stable.  4) Continue daily dressing changes.  Marcelle Overlie PA-C 07/26/2018 11:41 AM

## 2018-07-26 NOTE — Progress Notes (Signed)
Was admitted to be [facility], [place] 07/26/18  Subjective:  Patient seen at bed side.  Due for Dialysis again tomorrow.    Objective:  Vital signs in last 24 hours:  Temp:  [98.9 F (37.2 C)] 98.9 F (37.2 C) (03/19 0521) Pulse Rate:  [83-93] 93 (03/19 0836) Resp:  [18-19] 18 (03/19 0521) BP: (92-156)/(41-60) 94/54 (03/19 0836) SpO2:  [99 %-100 %] 100 % (03/19 0836) Weight:  [64.3 kg] 64.3 kg (03/19 0500)  Weight change:  Filed Weights   07/25/18 0930 07/25/18 1312 07/26/18 0500  Weight: 66.5 kg 65 kg 64.3 kg    Intake/Output:    Intake/Output Summary (Last 24 hours) at 07/26/2018 1405 Last data filed at 07/26/2018 0900 Gross per 24 hour  Intake 340 ml  Output 0 ml  Net 340 ml     Physical Exam: General:  Chronically ill-appearing, laying in the bed  HEENT  anicteric, moist oral mucous membranes  Neck  supple  Pulm/lungs  normal breathing effort, clear to auscultation  CVS/Heart  S1S2 rubs  Abdomen:   Soft, nontender, BS present  Extremities:  Right arm edema around the new AV graft  Neurologic:  Alert, follows commands  Skin:  Wound in the right leg and mid shin area  Access:  Right IJ PermCath, right upper arm AV graft-newly placed       Basic Metabolic Panel:  Recent Labs  Lab 07/22/18 0457 07/22/18 1838 07/23/18 1028 07/24/18 0315 07/25/18 0610 07/25/18 1050  NA 140 141 140 137 138  --   K 5.2* 5.0 6.2* 4.6 4.6  --   CL 102 105 103 97* 95*  --   CO2 20* 20* 19* 26 26  --   GLUCOSE 98 107* 95 121* 91  --   BUN 69* 75* 85* 42* 59*  --   CREATININE 10.99* 12.14* 13.26* 7.40* 8.76*  --   CALCIUM 9.9 9.4 9.8 9.0 9.4  --   PHOS  --   --  8.0*  --   --  5.8*     CBC: Recent Labs  Lab 07/21/18 1343 07/22/18 0457 07/23/18 1028 07/24/18 0315 07/25/18 0610  WBC 17.9* 15.8* 18.8* 26.6* 19.9*  NEUTROABS 14.4*  --   --   --   --   HGB 10.8* 10.2* 10.8* 10.3* 10.9*  HCT 34.4* 33.7* 34.6* 33.5* 36.4*  MCV 95.6  97.7 96.4 96.0 97.8  PLT 283 283 364 377 321      Lab Results  Component Value Date   HEPBSAG Negative 09/30/2015   HEPBSAB Reactive 01/29/2018      Microbiology:  Recent Results (from the past 240 hour(s))  Surgical pcr screen     Status: Abnormal   Collection Time: 07/17/18  2:49 PM  Result Value Ref Range Status   MRSA, PCR NEGATIVE NEGATIVE Final   Staphylococcus aureus POSITIVE (A) NEGATIVE Final    Comment: (NOTE) The Xpert SA Assay (FDA approved for NASAL specimens in patients 33 years of age and older), is one component of a comprehensive surveillance program. It is not intended to diagnose infection nor to guide or monitor treatment. Performed at [facility], [address] [place], [place] [address]   Wound or Superficial Culture     Status: None   Collection Time: 07/21/18  2:40 PM  Result Value Ref Range Status   Specimen Description   Final    WOUND Performed at [facility], [place],  [place] [address]    Special Requests   Final    NONE Performed at [facility], [place], [place] [address]    Gram Stain   Final    FEW WBC PRESENT, PREDOMINANTLY PMN ABUNDANT GRAM NEGATIVE RODS ABUNDANT GRAM POSITIVE COCCI IN PAIRS IN CLUSTERS Performed at [facility], [place] [address], [place], [place] [address]    Culture   Final    ABUNDANT ENTEROBACTER CLOACAE ABUNDANT PSEUDOMONAS AERUGINOSA    Report Status 07/26/2018 FINAL  Final   Organism ID, Bacteria ENTEROBACTER CLOACAE  Final   Organism ID, Bacteria PSEUDOMONAS AERUGINOSA  Final      Susceptibility   Enterobacter cloacae - MIC*    CEFAZOLIN >=64 RESISTANT Resistant     CEFEPIME <=1 SENSITIVE Sensitive     CEFTAZIDIME <=1 SENSITIVE Sensitive     CEFTRIAXONE <=1 SENSITIVE Sensitive     CIPROFLOXACIN <=0.25 SENSITIVE Sensitive     GENTAMICIN <=1 SENSITIVE Sensitive     IMIPENEM 1 SENSITIVE Sensitive     TRIMETH/SULFA <=20 SENSITIVE  Sensitive     PIP/TAZO 8 SENSITIVE Sensitive     * ABUNDANT ENTEROBACTER CLOACAE   Pseudomonas aeruginosa - MIC*    CEFTAZIDIME 4 SENSITIVE Sensitive     CIPROFLOXACIN <=0.25 SENSITIVE Sensitive     GENTAMICIN <=1 SENSITIVE Sensitive     IMIPENEM 2 SENSITIVE Sensitive     CEFEPIME <=1 SENSITIVE Sensitive     * ABUNDANT PSEUDOMONAS AERUGINOSA  Blood culture (single)     Status: None   Collection Time: 07/21/18  5:35 PM  Result Value Ref Range Status   Specimen Description BLOOD LFOA  Final   Special Requests   Final    BOTTLES DRAWN AEROBIC AND ANAEROBIC Blood Culture adequate volume   Culture   Final    NO GROWTH 5 DAYS Performed at [facility], [address] [place], [place] [address]    Report Status 07/26/2018 FINAL  Final    Coagulation Studies: No results for input(s): LABPROT, INR in the last 72 hours.  Urinalysis: No results for input(s): COLORURINE, LABSPEC, PHURINE, GLUCOSEU, HGBUR, BILIRUBINUR, KETONESUR, PROTEINUR, UROBILINOGEN, NITRITE, LEUKOCYTESUR in the last 72 hours.  Invalid input(s): APPERANCEUR    Imaging: No results found.   Medications:   . sodium chloride Stopped (07/24/18 0910)  . cefTAZidime (FORTAZ)  IV Stopped (07/25/18 1735)   . allopurinol  100 mg Oral Daily  . amLODipine  10 mg Oral Daily  . apixaban  2.5 mg Oral BID  . aspirin EC  81 mg Oral Daily  . atorvastatin  10 mg Oral Daily  . calcitRIOL  0.5 mcg Oral Daily  . calcium acetate  1,334 mg Oral TID WC  . Chlorhexidine Gluconate Cloth  6 each Topical Q0600  . Chlorhexidine Gluconate Cloth  6 each Topical Daily  . clopidogrel  75 mg Oral Daily  . diphenhydrAMINE  50 mg Oral Once   Or  . diphenhydrAMINE  50 mg Intravenous Once  . docusate sodium  100 mg Oral BID  . feeding supplement (NEPRO CARB STEADY)  237 mL Oral BID BM  . gabapentin  100 mg Oral QHS  . methylPREDNISolone (SOLU-MEDROL) injection  40 mg Intravenous Once  . metoprolol succinate  25 mg Oral QHS  .  multivitamin  1 tablet Oral QHS  . mupirocin ointment  1 application Nasal BID  . pregabalin  50 mg Oral Daily  . sevelamer carbonate  2,400 mg Oral TID  WC  . vitamin C  500 mg Oral BID   sodium chloride, acetaminophen **OR** acetaminophen, bisacodyl, colchicine, hydrALAZINE, HYDROcodone-acetaminophen, ondansetron **OR** ondansetron (ZOFRAN) IV, ondansetron (ZOFRAN) IV, traZODone  Assessment/ Plan:  68 y.o. African-American male  with end stage renal disease on hemodialysis, congestive heart failure, hypertension, peripheral vascular disease  [place]. MWF Rt IJ pC 65 kg, new AVG (Dr Lucky Cowboy 07/18/2018)  1. End-stage renal disease with mild hyperkalemia    2. Anemia of chronic kidney disease- Hgb 10.9 3. Secondary hyperparathyroidism- on binders 4.  Right lower extremity chronic wound  Plan: patient completed hemodialysis yesterday.  No acute indication for dialysis today.  We will have her dialysis again tomorrow.   Hemoglobin 10.9 at last check and his stable.  Recheck serum phosphorus tomorrow as well.    LOS: 5 Manasseh [place]ley 3/19/20202:05 PM  [place], [place]  Note: This note was prepared with Dragon dictation. Any transcription errors are unintentional

## 2018-07-26 NOTE — TOC Transition Note (Signed)
Transition of Care [facility]) - CM/SW Discharge Note   Patient Details  Name: Johnny [name]. MRN: [id number] Date of Birth: 08-27-1950  [facility]) CM/SW Contact:  Shela Leff, LCSW Phone Number: 07/26/2018, 1:30 PM   Clinical Narrative:   Patient discharging today. Patient's contact representative is aware: [name]. [facility] has received discharge information.     Final next level of care: Skilled Nursing Facility Barriers to Discharge: No Barriers Identified   Patient Goals and CMS Choice Patient states their goals for this hospitalization and ongoing recovery are:: confused CMS Medicare.gov Compare Post Acute Care list provided to:: Patient Represenative (must comment) Choice offered to / list presented to : (patient representative)  Discharge Placement   Existing PASRR number confirmed : 07/25/18          Patient chooses bed at: ([facility]) Patient to be transferred to facility by: EMS Name of family member notified: [name] Patient and family notified of of transfer: 07/26/18  Discharge Plan and Services                          Social Determinants of Health (SDOH) Interventions     Readmission Risk Interventions No flowsheet data found.

## 2018-08-03 ENCOUNTER — Ambulatory Visit (INDEPENDENT_AMBULATORY_CARE_PROVIDER_SITE_OTHER): Payer: Medicare Other | Admitting: Vascular Surgery

## 2018-08-07 ENCOUNTER — Encounter: Payer: Self-pay | Admitting: Vascular Surgery

## 2018-08-14 ENCOUNTER — Ambulatory Visit (INDEPENDENT_AMBULATORY_CARE_PROVIDER_SITE_OTHER): Payer: Medicare Other | Admitting: Vascular Surgery

## 2018-08-23 ENCOUNTER — Emergency Department: Payer: Medicare Other

## 2018-08-23 ENCOUNTER — Other Ambulatory Visit: Payer: Self-pay

## 2018-08-23 ENCOUNTER — Emergency Department
Admission: EM | Admit: 2018-08-23 | Discharge: 2018-08-23 | Disposition: A | Discharge status: Home / Self Care | Payer: Medicare Other | Attending: Student in an Organized Health Care Education/Training Program | Admitting: Student in an Organized Health Care Education/Training Program

## 2018-08-23 ENCOUNTER — Encounter: Payer: Self-pay | Admitting: Emergency Medicine

## 2018-08-23 DIAGNOSIS — Z87891 Personal history of nicotine dependence: Secondary | ICD-10-CM | POA: Insufficient documentation

## 2018-08-23 DIAGNOSIS — X58XXXS Exposure to other specified factors, sequela: Secondary | ICD-10-CM | POA: Diagnosis not present

## 2018-08-23 DIAGNOSIS — Z992 Dependence on renal dialysis: Secondary | ICD-10-CM | POA: Diagnosis not present

## 2018-08-23 DIAGNOSIS — N186 End stage renal disease: Secondary | ICD-10-CM | POA: Diagnosis not present

## 2018-08-23 DIAGNOSIS — M79661 Pain in right lower leg: Secondary | ICD-10-CM | POA: Diagnosis present

## 2018-08-23 DIAGNOSIS — S81801S Unspecified open wound, right lower leg, sequela: Secondary | ICD-10-CM | POA: Diagnosis not present

## 2018-08-23 DIAGNOSIS — Z7901 Long term (current) use of anticoagulants: Secondary | ICD-10-CM | POA: Diagnosis not present

## 2018-08-23 DIAGNOSIS — Z79899 Other long term (current) drug therapy: Secondary | ICD-10-CM | POA: Insufficient documentation

## 2018-08-23 DIAGNOSIS — L03115 Cellulitis of right lower limb: Secondary | ICD-10-CM | POA: Insufficient documentation

## 2018-08-23 DIAGNOSIS — I132 Hypertensive heart and chronic kidney disease with heart failure and with stage 5 chronic kidney disease, or end stage renal disease: Secondary | ICD-10-CM | POA: Insufficient documentation

## 2018-08-23 DIAGNOSIS — I5032 Chronic diastolic (congestive) heart failure: Secondary | ICD-10-CM | POA: Diagnosis not present

## 2018-08-23 LAB — CBC WITH DIFFERENTIAL/PLATELET
Abs Immature Granulocytes: 0.04 10*3/uL (ref 0.00–0.07)
Basophils Absolute: 0.1 10*3/uL (ref 0.0–0.1)
Basophils Relative: 1 %
Eosinophils Absolute: 0.2 10*3/uL (ref 0.0–0.5)
Eosinophils Relative: 2 %
HCT: 32.4 % — ABNORMAL LOW (ref 39.0–52.0)
Hemoglobin: 9.8 g/dL — ABNORMAL LOW (ref 13.0–17.0)
Immature Granulocytes: 0 %
Lymphocytes Relative: 12 %
Lymphs Abs: 1.2 10*3/uL (ref 0.7–4.0)
MCH: 29.3 pg (ref 26.0–34.0)
MCHC: 30.2 g/dL (ref 30.0–36.0)
MCV: 97 fL (ref 80.0–100.0)
Monocytes Absolute: 1.1 10*3/uL — ABNORMAL HIGH (ref 0.1–1.0)
Monocytes Relative: 11 %
Neutro Abs: 7.2 10*3/uL (ref 1.7–7.7)
Neutrophils Relative %: 74 %
Platelets: 291 10*3/uL (ref 150–400)
RBC: 3.34 MIL/uL — ABNORMAL LOW (ref 4.22–5.81)
RDW: 14.2 % (ref 11.5–15.5)
WBC: 9.8 10*3/uL (ref 4.0–10.5)
nRBC: 0 % (ref 0.0–0.2)

## 2018-08-23 LAB — BASIC METABOLIC PANEL
Anion gap: 12 (ref 5–15)
BUN: 13 mg/dL (ref 8–23)
CO2: 23 mmol/L (ref 22–32)
Calcium: 9.8 mg/dL (ref 8.9–10.3)
Chloride: 99 mmol/L (ref 98–111)
Creatinine, Ser: 5.97 mg/dL — ABNORMAL HIGH (ref 0.61–1.24)
GFR calc Af Amer: 10 mL/min — ABNORMAL LOW (ref >60)
GFR calc non Af Amer: 9 mL/min — ABNORMAL LOW (ref >60)
Glucose, Bld: 101 mg/dL — ABNORMAL HIGH (ref 70–99)
Potassium: 4.5 mmol/L (ref 3.5–5.1)
Sodium: 134 mmol/L — ABNORMAL LOW (ref 135–145)

## 2018-08-23 NOTE — ED Triage Notes (Signed)
PT arrives via ems with concerns over possible infection to right leg. Pt states wound care nurse came out today and noticed increased drainage to area.

## 2018-08-23 NOTE — ED Notes (Signed)
Called [facility] for ttransport to [phone]  [phone]

## 2018-08-23 NOTE — ED Provider Notes (Signed)
[facility] Emergency Department Provider Note    First MD Initiated Contact with Patient 08/23/18 1400     (approximate)  I have reviewed the triage vital signs and the nursing notes.   HISTORY  Chief Complaint Wound Infection    HPI Johnny [name]. is a 68 y.o. male below listed past medical history presents the ER for worsening foul malodorous purulent green drainage from his right chronic leg wound.  Denies any fevers.  Not currently on any antibiotics.  Had dialysis yesterday.  States he does see wound doctor but is not seen him for several weeks.  States he has chronic pain in the right lower extremity.  No new pain.  Denies any trauma.    Past Medical History:  Diagnosis Date  . Anemia   . Anxiety   . CHF (congestive heart failure) ([place])   . Chronic kidney disease    esrd dialysis m/w/f  . Gout   . Hyperlipidemia   . Hypertension   . Myocardial infarction ([place]) 2010   10 years ago  . 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  . Peripheral vascular disease ([place])    Family History  Problem Relation Age of Onset  . Hypertension Other   . Heart disease Other   . Diabetes Mother    Past Surgical History:  Procedure Laterality Date  . A/V SHUNTOGRAM Left 06/21/2017   Procedure: A/V SHUNTOGRAM;  Surgeon: Katha Cabal, MD;  Location: [place] [facility];  Service: Cardiovascular;  Laterality: Left;  . APPLICATION OF WOUND VAC Right 04/11/2018   Procedure: APPLICATION OF WOUND VAC;  Surgeon: Algernon Huxley, MD;  Location: [facility];  Service: Vascular;  Laterality: Right;  . AV FISTULA PLACEMENT Left 09/18/2015   Procedure: INSERTION OF ARTERIOVENOUS (AV) GORE-TEX GRAFT ARM ( BRACH/AXILLARY GRAFT W/ INSTANT STICK GRAFT );  Surgeon: Katha Cabal, MD;  Location: [facility];  Service: Vascular;  Laterality: Left;  . AV FISTULA PLACEMENT Right 07/19/2018   Procedure: INSERTION OF ARTERIOVENOUS (AV) GORE-TEX  GRAFT ARM ( BRACHIAL AXILLARY);  Surgeon: Algernon Huxley, MD;  Location: [facility] OR[facility];  Service: Vascular;  Laterality: Right;  . DIALYSIS FISTULA CREATION Right 10/2017   right chest perm cath  . ESOPHAGOGASTRODUODENOSCOPY N/A 12/19/2017   Procedure: ESOPHAGOGASTRODUODENOSCOPY (EGD);  Surgeon: Lin Landsman, MD;  Location: [facility] ENDOSCOPY;  Service: Gastroenterology;  Laterality: N/A;  . LOWER EXTREMITY ANGIOGRAPHY Left 11/16/2017   Procedure: LOWER EXTREMITY ANGIOGRAPHY;  Surgeon: Algernon Huxley, MD;  Location: [place] [facility];  Service: Cardiovascular;  Laterality: Left;  . LOWER EXTREMITY ANGIOGRAPHY Right 01/18/2018   Procedure: LOWER EXTREMITY ANGIOGRAPHY;  Surgeon: Algernon Huxley, MD;  Location: [place] [facility];  Service: Cardiovascular;  Laterality: Right;  . LOWER EXTREMITY ANGIOGRAPHY Left 04/02/2018   Procedure: LOWER EXTREMITY ANGIOGRAPHY;  Surgeon: Algernon Huxley, MD;  Location: [place] [facility];  Service: Cardiovascular;  Laterality: Left;  . LOWER EXTREMITY ANGIOGRAPHY Right 04/09/2018   Procedure: Lower Extremity Angiography with possible intervention;  Surgeon: Algernon Huxley, MD;  Location: [place] [facility];  Service: Cardiovascular;  Laterality: Right;  . LOWER EXTREMITY ANGIOGRAPHY Right 07/23/2018   Procedure: Lower Extremity Angiography;  Surgeon: Algernon Huxley, MD;  Location: [place] [facility];  Service: Cardiovascular;  Laterality: Right;  . PERIPHERAL VASCULAR CATHETERIZATION Left 09/01/2015   Procedure: A/V Shuntogram/Fistulagram;  Surgeon: Katha Cabal, MD;  Location: [place] [facility];  Service: Cardiovascular;  Laterality: Left;  . PERIPHERAL VASCULAR CATHETERIZATION N/A 09/30/2015   Procedure: A/V Shuntogram/Fistulagram with perm cathether removal;  Surgeon: Algernon Huxley, MD;  Location: [place] [facility];  Service: Cardiovascular;  Laterality: N/A;  . PERIPHERAL VASCULAR CATHETERIZATION Left 09/30/2015   Procedure: A/V Shunt Intervention;  Surgeon: Algernon Huxley, MD;  Location: [place] [facility];  Service: Cardiovascular;  Laterality: Left;  . PERIPHERAL VASCULAR CATHETERIZATION Left 12/03/2015   Procedure: Thrombectomy;  Surgeon: Algernon Huxley, MD;  Location: [place] [facility];  Service: Cardiovascular;  Laterality: Left;  . PERIPHERAL VASCULAR CATHETERIZATION Left 01/28/2016   Procedure: Thrombectomy;  Surgeon: Algernon Huxley, MD;  Location: [place] [facility];  Service: Cardiovascular;  Laterality: Left;  . PERIPHERAL VASCULAR CATHETERIZATION N/A 01/28/2016   Procedure: A/V Shuntogram/Fistulagram;  Surgeon: Algernon Huxley, MD;  Location: [place] [facility];  Service: Cardiovascular;  Laterality: N/A;  . SKIN SPLIT GRAFT Right 05/24/2018   Procedure: SKIN GRAFT SPLIT THICKNESS ( RIGHT CALF);  Surgeon: Algernon Huxley, MD;  Location: [facility];  Service: Vascular;  Laterality: Right;  . WOUND DEBRIDEMENT Right 04/11/2018   Procedure: DEBRIDEMENT WOUND calf muscle and skin;  Surgeon: Algernon Huxley, MD;  Location: [facility];  Service: Vascular;  Laterality: Right;   Patient Active Problem List   Diagnosis Date Noted  . Cellulitis of right leg 07/21/2018  . Lower limb ulcer, calf, right, limited to breakdown of skin ([place]) 06/08/2018  . Malnutrition of moderate degree 04/11/2018  . Pressure injury of skin 04/06/2018  . Altered mental status 04/04/2018  . Hypothermia 04/04/2018  . Hyperlipidemia 02/27/2018  . Diabetes ([place]) 02/27/2018  . Weakness of right lower extremity 01/20/2018  . Fever   . Periumbilical abdominal pain   . Confusion 12/22/2017  . Acute delirium 12/21/2017  . Protein-calorie malnutrition, severe 12/19/2017  . Intractable nausea and vomiting 12/18/2017  . Lymphedema 12/13/2017  . Cellulitis 11/27/2017  . Chest pain 11/19/2017  . Atherosclerosis of native arteries of the extremities with ulceration ([place]) 11/07/2017  . Elevated troponin 10/02/2015  . Complications, dialysis, catheter, mechanical ([place]) 10/02/2015  .  Musculoskeletal chest pain 09/28/2015  . ESRD on dialysis ([place]) 09/28/2015  . HTN (hypertension) 09/28/2015  . Chronic diastolic CHF (congestive heart failure) ([place]) 09/28/2015  . Gout 09/28/2015      Prior to Admission medications   Medication Sig Start Date End Date Taking? Authorizing Provider  allopurinol (ZYLOPRIM) 100 MG tablet Take 100 mg by mouth daily.    [provider]  apixaban (ELIQUIS) 2.5 MG TABS tablet Take 1 tablet (2.5 mg total) by mouth 2 (two) times daily. 07/26/18   Bettey Costa, MD  atorvastatin (LIPITOR) 10 MG tablet Take 1 tablet by mouth daily. 11/23/17   [provider]  calcitRIOL (ROCALTROL) 0.5 MCG capsule Take 0.5 mcg by mouth daily.    [provider]  calcium acetate (PHOSLO) 667 MG capsule Take 1,334 mg by mouth 3 (three) times daily with meals.    [provider]  clonazePAM (KLONOPIN) 0.5 MG tablet Take 1 tablet (0.5 mg total) by mouth as needed. 07/26/18   Bettey Costa, MD  clopidogrel (PLAVIX) 75 MG tablet Take 1 tablet (75 mg total) by mouth daily. Patient not taking: Reported on 07/21/2018 04/02/18   Algernon Huxley, MD  folic acid-vitamin b complex-vitamin c-selenium-zinc (DIALYVITE) 3 MG TABS tablet Take 1 tablet by mouth daily.     [provider]  gabapentin (NEURONTIN)  100 MG capsule Take 1 capsule (100 mg total) by mouth at bedtime. 07/26/18   Bettey Costa, MD  metoprolol succinate (TOPROL-XL) 25 MG 24 hr tablet Take 25 mg by mouth at bedtime. 03/02/18   [provider]    Allergies Shellfish allergy    Social History Social History   Tobacco Use  . Smoking status: Former Smoker    Types: Cigarettes    Last attempt to quit: 05/17/2005    Years since quitting: 13.2  . Smokeless tobacco: Never Used  Substance Use Topics  . Alcohol use: No  . Drug use: No    Review of Systems Patient denies headaches, rhinorrhea, blurry vision, numbness, shortness of breath, chest pain, edema, cough,  abdominal pain, nausea, vomiting, diarrhea, dysuria, fevers, rashes or hallucinations unless otherwise stated above in HPI. ____________________________________________   PHYSICAL EXAM:  VITAL SIGNS: Vitals:   08/23/18 1410  BP: (!) 134/59  Pulse: 91  Resp: 20  Temp: 98.2 F (36.8 C)  SpO2: 99%    Constitutional: Alert and oriented.  Eyes: Conjunctivae are normal.  Head: Atraumatic. Nose: No congestion/rhinnorhea. Mouth/Throat: Mucous membranes are moist.   Neck: No stridor. Painless ROM.  Cardiovascular: Normal rate, regular rhythm. Grossly normal heart sounds.  Good peripheral circulation. Respiratory: Normal respiratory effort.  No retractions. Lungs CTAB. Gastrointestinal: Soft and nontender. No distention. No abdominal bruits. No CVA tenderness. Genitourinary:  Musculoskeletal: Extensive chronic cellulitic ulcerative changes to right lower extremity.  Purulent foul-smelling greenish discharge coming from the lateral wound.  No joint effusions. Neurologic:  Normal speech and language. No gross focal neurologic deficits are appreciated. No facial droop Skin:  Skin is warm, dry and intact. No rash noted. Psychiatric: Mood and affect are normal. Speech and behavior are normal.  ____________________________________________   LABS (all labs ordered are listed, but only abnormal results are displayed)  Results for orders placed or performed during the hospital encounter of 08/23/18 (from the past 24 hour(s))  CBC with Differential/Platelet     Status: Abnormal   Collection Time: 08/23/18  2:13 PM  Result Value Ref Range   WBC 9.8 4.0 - 10.5 K/uL   RBC 3.34 (L) 4.22 - 5.81 MIL/uL   Hemoglobin 9.8 (L) 13.0 - 17.0 g/dL   HCT 32.4 (L) 39.0 - 52.0 %   MCV 97.0 80.0 - 100.0 fL   MCH 29.3 26.0 - 34.0 pg   MCHC 30.2 30.0 - 36.0 g/dL   RDW 14.2 11.5 - 15.5 %   Platelets 291 150 - 400 K/uL   nRBC 0.0 0.0 - 0.2 %   Neutrophils Relative % 74 %   Neutro Abs 7.2 1.7 - 7.7 K/uL    Lymphocytes Relative 12 %   Lymphs Abs 1.2 0.7 - 4.0 K/uL   Monocytes Relative 11 %   Monocytes Absolute 1.1 (H) 0.1 - 1.0 K/uL   Eosinophils Relative 2 %   Eosinophils Absolute 0.2 0.0 - 0.5 K/uL   Basophils Relative 1 %   Basophils Absolute 0.1 0.0 - 0.1 K/uL   Immature Granulocytes 0 %   Abs Immature Granulocytes 0.04 0.00 - 0.07 K/uL  Basic metabolic panel     Status: Abnormal   Collection Time: 08/23/18  2:13 PM  Result Value Ref Range   Sodium 134 (L) 135 - 145 mmol/L   Potassium 4.5 3.5 - 5.1 mmol/L   Chloride 99 98 - 111 mmol/L   CO2 23 22 - 32 mmol/L   Glucose, Bld 101 (H) 70 -  99 mg/dL   BUN 13 8 - 23 mg/dL   Creatinine, Ser 5.97 (H) 0.61 - 1.24 mg/dL   Calcium 9.8 8.9 - 10.3 mg/dL   GFR calc non Af Amer 9 (L) >60 mL/min   GFR calc Af Amer 10 (L) >60 mL/min   Anion gap 12 5 - 15   ____________________________________________ ____________________________________________  RADIOLOGY  I personally reviewed all radiographic images ordered to evaluate for the above acute complaints and reviewed radiology reports and findings.  These findings were personally discussed with the patient.  Please see medical record for radiology report.  ____________________________________________   PROCEDURES  Procedure(s) performed:  Procedures    Critical Care performed: no ____________________________________________   INITIAL IMPRESSION / ASSESSMENT AND PLAN / ED COURSE  Pertinent labs & imaging results that were available during my care of the patient were reviewed by me and considered in my medical decision making (see chart for details).   DDX: chornic wound, granulation tissue, cellulitis, osteo  Johnny [name]. is a 68 y.o. who presents to the ED with symptoms as described above.  Patient nontoxic-appearing no acute distress.  Exam as above.  Does have foul-smelling chronic wound to the right lower extremity but there is no significant warmth he is not febrile.  Will  check blood work and obtain wound culture.  Appears that he is previously been on antibiotics patient states that he otherwise feels well and was only brought to the ER because the home health nurse thought that the drainage was abnormal but he had noticed that previously.  Clinical Course as of Aug 23 1450  Thu Aug 23, 2018  1449 On further discussion patient states that he feels like the wound is actually improving from recent admissions.  Is not currently on any antibiotics.  Is not having fevers.  His blood work is reassuring he does not have any fever or white count.  He does have good peripheral perfusion with Doppler signal to the DP.  At this point I do not see any indication for hospitalization.  He has good outpatient follow-up with both vascular surgery as well as nephrology.  I discussed case with Dr. Holley Raring who states they too are monitoring his wound status.  Have discussed with the patient and available family all diagnostics and treatments performed thus far and all questions were answered to the best of my ability. The patient demonstrates understanding and agreement with plan.    [PR]    Clinical Course User Index [PR] Merlyn Lot, MD    The patient was evaluated in Emergency Department today for the symptoms described in the history of present illness. He/she was evaluated in the context of the global COVID-19 pandemic, which necessitated consideration that the patient might be at risk for infection with the SARS-CoV-2 virus that causes COVID-19. Institutional protocols and algorithms that pertain to the evaluation of patients at risk for COVID-19 are in a state of rapid change based on information released by regulatory bodies including the CDC and federal and state organizations. These policies and algorithms were followed during the patient's care in the ED.  As part of my medical decision making, I reviewed the following data within the [place]  notes reviewed and incorporated, Labs reviewed, notes from prior ED visits and [place] Controlled Substance Database   ____________________________________________   FINAL CLINICAL IMPRESSION(S) / ED DIAGNOSES  Final diagnoses:  Leg wound, right, sequela  Cellulitis of right lower extremity  NEW MEDICATIONS STARTED DURING THIS VISIT:  New Prescriptions   No medications on file     Note:  This document was prepared using Dragon voice recognition software and may include unintentional dictation errors.    Merlyn Lot, MD 08/23/18 1452

## 2018-08-23 NOTE — Discharge Instructions (Addendum)
Please follow-up with PCP as well as nephrology and vascular surgery.  Return immediately if you have any worsening pain or fevers, as that may suggest you need antibiotics.

## 2018-08-24 ENCOUNTER — Telehealth (INDEPENDENT_AMBULATORY_CARE_PROVIDER_SITE_OTHER): Payer: Self-pay

## 2018-08-24 NOTE — Telephone Encounter (Signed)
Peggye Form from [facility] [employer] had called asking if the provider think the patient would be able to care for his wound care or should he continue with assistance .I spoke with Dr Lucky Cowboy and he feels the patient is not capable and patient has missed the last two appointments.She asked if the doctor would be able to do a letter and he advise it would be better to come from PCP or nephrologist.

## 2018-08-27 ENCOUNTER — Telehealth (INDEPENDENT_AMBULATORY_CARE_PROVIDER_SITE_OTHER): Payer: Self-pay | Admitting: Vascular Surgery

## 2018-08-27 NOTE — Telephone Encounter (Signed)
Per Arna Medici please schedule the patient to come in with ABI with Dr. Lucky Cowboy. Thank you

## 2018-08-28 ENCOUNTER — Ambulatory Visit (INDEPENDENT_AMBULATORY_CARE_PROVIDER_SITE_OTHER): Payer: Medicare Other | Admitting: Nurse Practitioner

## 2018-08-28 ENCOUNTER — Encounter (INDEPENDENT_AMBULATORY_CARE_PROVIDER_SITE_OTHER): Payer: Medicare Other

## 2018-08-28 LAB — AEROBIC CULTURE W GRAM STAIN (SUPERFICIAL SPECIMEN)

## 2018-08-29 NOTE — Hospital Discharge Follow-Up (Signed)
ED Antimicrobial Stewardship Positive Culture Follow Up   Johnny [name]. is an 68 y.o. male who presented to [facility] on 08/23/2018 with a chief complaint of  Chief Complaint  Patient presents with  . Wound Infection    Recent Results (from the past 720 hour(s))  Wound or Superficial Culture     Status: None   Collection Time: 08/23/18  2:13 PM  Result Value Ref Range Status   Specimen Description   Final    WOUND Performed at [facility], [address], [place], [place] [address]    Special Requests   Final    Immunocompromised Performed at [facility], [address] [place], [place] [address]    Gram Stain   Final    FEW WBC PRESENT, PREDOMINANTLY PMN FEW GRAM NEGATIVE RODS Performed at [facility], [place] [address], [place], [place] [address]    Culture   Final    MODERATE ENTEROBACTER CLOACAE MODERATE PSEUDOMONAS AERUGINOSA    Report Status 08/28/2018 FINAL  Final   Organism ID, Bacteria ENTEROBACTER CLOACAE  Final   Organism ID, Bacteria PSEUDOMONAS AERUGINOSA  Final      Susceptibility   Enterobacter cloacae - MIC*    CEFAZOLIN >=64 RESISTANT Resistant     CEFEPIME 2 SENSITIVE Sensitive     CEFTAZIDIME >=64 RESISTANT Resistant     CEFTRIAXONE >=64 RESISTANT Resistant     CIPROFLOXACIN <=0.25 SENSITIVE Sensitive     GENTAMICIN <=1 SENSITIVE Sensitive     IMIPENEM <=0.25 SENSITIVE Sensitive     TRIMETH/SULFA <=20 SENSITIVE Sensitive     PIP/TAZO >=128 RESISTANT Resistant     * MODERATE ENTEROBACTER CLOACAE   Pseudomonas aeruginosa - MIC*    CEFTAZIDIME 16 INTERMEDIATE Intermediate     CIPROFLOXACIN <=0.25 SENSITIVE Sensitive     GENTAMICIN <=1 SENSITIVE Sensitive     IMIPENEM 2 SENSITIVE Sensitive     CEFEPIME INTERMEDIATE Intermediate     * MODERATE PSEUDOMONAS AERUGINOSA    [x]  Patient discharged originally without antimicrobial agent and treatment is now indicated  New antibiotic prescription: Ciprofloxacin 500mg  q12h x  1 week  ED Provider: Lenise Arena, MD  Rx called in 04/22 3pm to [place] Cutter, [facility] @ [facility] - pt caregiver aware.   Lu Duffel, PharmD, BCPS Clinical Pharmacist 08/29/2018 3:01 PM

## 2018-08-30 ENCOUNTER — Telehealth (INDEPENDENT_AMBULATORY_CARE_PROVIDER_SITE_OTHER): Payer: Self-pay

## 2018-08-30 NOTE — Telephone Encounter (Signed)
Annette from [facility] called requesting wound orders I spoke with Arna Medici NP and she order for the nurse to use aqaucel ag and kerlix on right leg but I did inform the nurse that we have not seen the wound recently because the patient has miss appointments but he will be coming tomorrow and if orders are change we will contact her

## 2018-08-31 ENCOUNTER — Encounter (INDEPENDENT_AMBULATORY_CARE_PROVIDER_SITE_OTHER): Payer: Self-pay | Admitting: Nurse Practitioner

## 2018-08-31 ENCOUNTER — Ambulatory Visit (INDEPENDENT_AMBULATORY_CARE_PROVIDER_SITE_OTHER): Payer: Medicare Other | Admitting: Nurse Practitioner

## 2018-08-31 ENCOUNTER — Other Ambulatory Visit: Payer: Self-pay

## 2018-08-31 ENCOUNTER — Ambulatory Visit (INDEPENDENT_AMBULATORY_CARE_PROVIDER_SITE_OTHER): Payer: Medicare Other

## 2018-08-31 VITALS — BP 193/66 | HR 88 | Resp 16

## 2018-08-31 DIAGNOSIS — Z95820 Peripheral vascular angioplasty status with implants and grafts: Secondary | ICD-10-CM | POA: Diagnosis not present

## 2018-08-31 DIAGNOSIS — N186 End stage renal disease: Secondary | ICD-10-CM | POA: Diagnosis not present

## 2018-08-31 DIAGNOSIS — I10 Essential (primary) hypertension: Secondary | ICD-10-CM | POA: Diagnosis not present

## 2018-08-31 DIAGNOSIS — I7025 Atherosclerosis of native arteries of other extremities with ulceration: Secondary | ICD-10-CM

## 2018-08-31 DIAGNOSIS — L97211 Non-pressure chronic ulcer of right calf limited to breakdown of skin: Secondary | ICD-10-CM

## 2018-08-31 DIAGNOSIS — Z992 Dependence on renal dialysis: Secondary | ICD-10-CM

## 2018-08-31 DIAGNOSIS — E785 Hyperlipidemia, unspecified: Secondary | ICD-10-CM

## 2018-09-03 ENCOUNTER — Encounter (INDEPENDENT_AMBULATORY_CARE_PROVIDER_SITE_OTHER): Payer: Self-pay | Admitting: Nurse Practitioner

## 2018-09-03 ENCOUNTER — Other Ambulatory Visit (INDEPENDENT_AMBULATORY_CARE_PROVIDER_SITE_OTHER): Payer: Self-pay | Admitting: Nurse Practitioner

## 2018-09-03 ENCOUNTER — Encounter (INDEPENDENT_AMBULATORY_CARE_PROVIDER_SITE_OTHER): Payer: Self-pay

## 2018-09-03 NOTE — Progress Notes (Signed)
SUBJECTIVE:  Patient ID: Johnny [name]., male    DOB: [birth year], 68 y.o.   MRN: [id number] Chief Complaint  Patient presents with  . Follow-up    u/s follow up and wound check    HPI  Johnny [name]. is a 68 y.o. male that presents for noninvasive studies today in the evaluation of his right lower extremity wound.  The patient previously had a skin graft placed on a wound on his right lower extremity.  This wound was healing very nicely, until his recent AV graft placement surgery.  Now the patient has an exposed area on his calf, nearly the entire size of his calf with exposed tendon and muscle.  The patient states that the wound looks better than it has previously.  He believes that it is beginning to heal.  He also mentions an additional problem with his right upper extremity.  He states that it has swollen greatly.  His right arm and hand are approximately two times the size of his left.    Today his ABIs are non-compressible.  His right TBI is 0.93 and his left is 0.88.  Previous studies done on 04/24/2018 had an ABI of 1.14 with a TBI of 0.59.  The right leg did not have ABIs done due to wound vac placement.  There are monophasic waveforms bilaterally.  Past Medical History:  Diagnosis Date  . Anemia   . Anxiety   . CHF (congestive heart failure) ([place])   . Chronic kidney disease    esrd dialysis m/w/f  . Gout   . Hyperlipidemia   . Hypertension   . Myocardial infarction ([place]) 2010   10 years ago  . 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  . Peripheral vascular disease [facility])     Past Surgical History:  Procedure Laterality Date  . A/V SHUNTOGRAM Left 06/21/2017   Procedure: A/V SHUNTOGRAM;  Surgeon: Katha Cabal, MD;  Location: [place] [facility];  Service: Cardiovascular;  Laterality: Left;  . APPLICATION OF WOUND VAC Right 04/11/2018   Procedure: APPLICATION OF WOUND VAC;  Surgeon: Algernon Huxley, MD;  Location: [facility] ORS;   Service: Vascular;  Laterality: Right;  . AV FISTULA PLACEMENT Left 09/18/2015   Procedure: INSERTION OF ARTERIOVENOUS (AV) GORE-TEX GRAFT ARM ( BRACH/AXILLARY GRAFT W/ INSTANT STICK GRAFT );  Surgeon: Katha Cabal, MD;  Location: [facility] OR[facility];  Service: Vascular;  Laterality: Left;  . AV FISTULA PLACEMENT Right 07/19/2018   Procedure: INSERTION OF ARTERIOVENOUS (AV) GORE-TEX GRAFT ARM ( BRACHIAL AXILLARY);  Surgeon: Algernon Huxley, MD;  Location: [facility] OR[facility];  Service: Vascular;  Laterality: Right;  . DIALYSIS FISTULA CREATION Right 10/2017   right chest perm cath  . ESOPHAGOGASTRODUODENOSCOPY N/A 12/19/2017   Procedure: ESOPHAGOGASTRODUODENOSCOPY (EGD);  Surgeon: Lin Landsman, MD;  Location: [facility]COPY;  Service: Gastroenterology;  Laterality: N/A;  . LOWER EXTREMITY ANGIOGRAPHY Left 11/16/2017   Procedure: LOWER EXTREMITY ANGIOGRAPHY;  Surgeon: Algernon Huxley, MD;  Location: [place] [facility];  Service: Cardiovascular;  Laterality: Left;  . LOWER EXTREMITY ANGIOGRAPHY Right 01/18/2018   Procedure: LOWER EXTREMITY ANGIOGRAPHY;  Surgeon: Algernon Huxley, MD;  Location: [place] [facility];  Service: Cardiovascular;  Laterality: Right;  . LOWER EXTREMITY ANGIOGRAPHY Left 04/02/2018   Procedure: LOWER EXTREMITY ANGIOGRAPHY;  Surgeon: Algernon Huxley, MD;  Location: [place] [facility];  Service: Cardiovascular;  Laterality: Left;  . LOWER EXTREMITY ANGIOGRAPHY Right 04/09/2018   Procedure: Lower  Extremity Angiography with possible intervention;  Surgeon: Algernon Huxley, MD;  Location: [place] [facility];  Service: Cardiovascular;  Laterality: Right;  . LOWER EXTREMITY ANGIOGRAPHY Right 07/23/2018   Procedure: Lower Extremity Angiography;  Surgeon: Algernon Huxley, MD;  Location: [place] [facility];  Service: Cardiovascular;  Laterality: Right;  . PERIPHERAL VASCULAR CATHETERIZATION Left 09/01/2015   Procedure: A/V Shuntogram/Fistulagram;  Surgeon: Katha Cabal, MD;  Location: [place] [facility];   Service: Cardiovascular;  Laterality: Left;  . PERIPHERAL VASCULAR CATHETERIZATION N/A 09/30/2015   Procedure: A/V Shuntogram/Fistulagram with perm cathether removal;  Surgeon: Algernon Huxley, MD;  Location: [place] [facility];  Service: Cardiovascular;  Laterality: N/A;  . PERIPHERAL VASCULAR CATHETERIZATION Left 09/30/2015   Procedure: A/V Shunt Intervention;  Surgeon: Algernon Huxley, MD;  Location: [place] [facility];  Service: Cardiovascular;  Laterality: Left;  . PERIPHERAL VASCULAR CATHETERIZATION Left 12/03/2015   Procedure: Thrombectomy;  Surgeon: Algernon Huxley, MD;  Location: [place] [facility];  Service: Cardiovascular;  Laterality: Left;  . PERIPHERAL VASCULAR CATHETERIZATION Left 01/28/2016   Procedure: Thrombectomy;  Surgeon: Algernon Huxley, MD;  Location: [place] [facility];  Service: Cardiovascular;  Laterality: Left;  . PERIPHERAL VASCULAR CATHETERIZATION N/A 01/28/2016   Procedure: A/V Shuntogram/Fistulagram;  Surgeon: Algernon Huxley, MD;  Location: [place] [facility];  Service: Cardiovascular;  Laterality: N/A;  . SKIN SPLIT GRAFT Right 05/24/2018   Procedure: SKIN GRAFT SPLIT THICKNESS ( RIGHT CALF);  Surgeon: Algernon Huxley, MD;  Location: [facility];  Service: Vascular;  Laterality: Right;  . WOUND DEBRIDEMENT Right 04/11/2018   Procedure: DEBRIDEMENT WOUND calf muscle and skin;  Surgeon: Algernon Huxley, MD;  Location: [facility] OR[facility];  Service: Vascular;  Laterality: Right;    Social History   Socioeconomic History  . Marital status: Single    Spouse name: Not on file  . Number of children: Not on file  . Years of education: Not on file  . Highest education level: Not on file  Occupational History  . Occupation: retired    Comment: Medical laboratory scientific officer  Social Needs  . Financial resource strain: Patient refused  . Food insecurity:    Worry: Patient refused    Inability: Patient refused  . Transportation needs:    Medical: Patient refused    Non-medical: Patient refused   Tobacco Use  . Smoking status: Former Smoker    Types: Cigarettes    Last attempt to quit: 05/17/2005    Years since quitting: 13.3  . Smokeless tobacco: Never Used  Substance and Sexual Activity  . Alcohol use: No  . Drug use: No  . Sexual activity: Not Currently  Lifestyle  . Physical activity:    Days per week: Patient refused    Minutes per session: Patient refused  . Stress: Not on file  Relationships  . Social connections:    Talks on phone: Patient refused    Gets together: Patient refused    Attends religious service: Patient refused    Active member of club or organization: Patient refused    Attends meetings of clubs or organizations: Patient refused    Relationship status: Patient refused  . Intimate partner violence:    Fear of current or ex partner: Patient refused    Emotionally abused: Patient refused    Physically abused: Patient refused    Forced sexual activity: Patient refused  Other Topics Concern  . Not on file  Social History Narrative  .  Not on file    Family History  Problem Relation Age of Onset  . Hypertension Other   . Heart disease Other   . Diabetes Mother     Allergies  Allergen Reactions  . Shellfish Allergy Anaphylaxis     Review of Systems   Review of Systems: Negative Unless Checked Constitutional: [] Weight loss  [] Fever  [] Chills Cardiac: [] Chest pain   []  Atrial Fibrillation  [] Palpitations   [] Shortness of breath when laying flat   [] Shortness of breath with exertion. [] Shortness of breath at rest Vascular:  [] Pain in legs with walking   [] Pain in legs with standing [] Pain in legs when laying flat   [] Claudication    [] Pain in feet when laying flat    [] History of DVT   [] Phlebitis   [] Swelling in legs   [] Varicose veins   [] Non-healing ulcers Pulmonary:   [] Uses home oxygen   [] Productive cough   [] Hemoptysis   [] Wheeze  [] COPD   [] Asthma Neurologic:  [] Dizziness   [] Seizures  [] Blackouts [] History of stroke   [] History of TIA   [] Aphasia   [] Temporary Blindness   [] Weakness or numbness in arm   [] Weakness or numbness in leg Musculoskeletal:   [] Joint swelling   [] Joint pain   [] Low back pain  []  History of Knee Replacement [] Arthritis [] back Surgeries  []  Spinal Stenosis    Hematologic:  [] Easy bruising  [] Easy bleeding   [] Hypercoagulable state   [x] Anemic Gastrointestinal:  [] Diarrhea   [] Vomiting  [] Gastroesophageal reflux/heartburn   [] Difficulty swallowing. [] Abdominal pain Genitourinary:  [x] Chronic kidney disease   [] Difficult urination  [] Anuric   [] Blood in urine [] Frequent urination  [] Burning with urination   [] Hematuria Skin:  [] Rashes   [] Ulcers [x] Wounds Psychological:  [] History of anxiety   []  History of major depression  []  Memory Difficulties      OBJECTIVE:   Physical Exam  BP (!) 193/66 (BP Location: Left Arm)   Pulse 88   Resp 16   Gen: WD/WN, NAD Head: [employer]/AT, No temporalis wasting.  Ear/Nose/Throat: Hearing grossly intact, nares w/o erythema or drainage Eyes: PER, EOMI, sclera nonicteric.  Neck: Supple, no masses.  No JVD.  Pulmonary:  Good air movement, no use of accessory muscles.  Cardiac: RRR Vascular: Very extensive wound of calf involving exposed muscle and tendon.  Right upper extremity is twice the size of left with good thrill and bruit.   Vessel Right Left  Radial Palpable Palpable   Gastrointestinal: soft, non-distended. No guarding/no peritoneal signs.  Musculoskeletal: M/S 5/5 throughout.  No deformity or atrophy.  Neurologic: Pain and light touch intact in extremities.  Symmetrical.  Speech is fluent. Motor exam as listed above. Psychiatric: Judgment intact, Mood & affect appropriate for pt's clinical situation. Dermatologic: See above description of wound. No changes consistent with cellulitis. Lymph : No Cervical lymphadenopathy, no lichenification or skin changes of chronic lymphedema.       ASSESSMENT AND PLAN:  1. Lower limb ulcer, calf, right, limited to  breakdown of skin ([place])  Recommend:  The patient has evidence of severe atherosclerotic changes of both lower extremities associated with ulceration and tissue loss of the foot.  This represents a limb threatening ischemia and places the patient at the risk for limb loss.  Patient should undergo angiography of the lower extremities with the hope for intervention for limb salvage.  The risks and benefits as well as the alternative therapies was discussed in detail with the patient.  All questions were answered.  Patient agrees to proceed with angiography.  The patient will follow up with me in the office after the procedure.   We will also refer to wound center.  2. ESRD on dialysis [facility]) Recommend:  The patient is experiencing increasing problems with their dialysis access.  Patient should have a fistulagram with the intention for intervention.  The intention for intervention is to restore appropriate flow and prevent thrombosis and possible loss of the access.  As well as improve the quality of dialysis therapy.  The risks, benefits and alternative therapies were reviewed in detail with the patient.  All questions were answered.  The patient agrees to proceed with angio/intervention.      3. Hypertension, unspecified type Continue antihypertensive medications as already ordered, these medications have been reviewed and there are no changes at this time.   4. Hyperlipidemia, unspecified hyperlipidemia type Continue statin as ordered and reviewed, no changes at this time    Current Outpatient Medications on File Prior to Visit  Medication Sig Dispense Refill  . allopurinol (ZYLOPRIM) 100 MG tablet Take 100 mg by mouth daily.    [name] apixaban (ELIQUIS) 2.5 MG TABS tablet Take 1 tablet (2.5 mg total) by mouth 2 (two) times daily. 60 tablet 0  . atorvastatin (LIPITOR) 10 MG tablet Take 1 tablet by mouth daily.    . calcitRIOL (ROCALTROL) 0.5 MCG capsule Take 0.5 mcg by mouth daily.    .  calcium acetate (PHOSLO) 667 MG capsule Take 1,334 mg by mouth 3 (three) times daily with meals.    . clonazePAM (KLONOPIN) 0.5 MG tablet Take 1 tablet (0.5 mg total) by mouth as needed. 10 tablet 0  . folic acid-vitamin b complex-vitamin c-selenium-zinc (DIALYVITE) 3 MG TABS tablet Take 1 tablet by mouth daily.     [name] gabapentin (NEURONTIN) 100 MG capsule Take 1 capsule (100 mg total) by mouth at bedtime.    . metoprolol succinate (TOPROL-XL) 25 MG 24 hr tablet Take 25 mg by mouth at bedtime.    . clopidogrel (PLAVIX) 75 MG tablet Take 1 tablet (75 mg total) by mouth daily. (Patient not taking: Reported on 07/21/2018) 30 tablet 11   No current facility-administered medications on file prior to visit.     There are no Patient Instructions on file for this visit. No follow-ups on file.   Kris Hartmann, NP  This note was completed with Sales executive.  Any errors are purely unintentional.

## 2018-09-05 MED ORDER — CEFAZOLIN SODIUM-DEXTROSE 1-4 GM/50ML-% IV SOLN
1.0000 g | Freq: Once | INTRAVENOUS | Status: AC
  Administered 2018-09-06: 10:00:00 1 g via INTRAVENOUS

## 2018-09-06 ENCOUNTER — Telehealth (INDEPENDENT_AMBULATORY_CARE_PROVIDER_SITE_OTHER): Payer: Self-pay

## 2018-09-06 ENCOUNTER — Ambulatory Visit
Admission: RE | Admit: 2018-09-06 | Discharge: 2018-09-06 | Disposition: A | Discharge status: Home / Self Care | Payer: Medicare Other | Attending: Vascular Surgery | Admitting: Vascular Surgery

## 2018-09-06 ENCOUNTER — Other Ambulatory Visit: Payer: Self-pay

## 2018-09-06 ENCOUNTER — Other Ambulatory Visit (INDEPENDENT_AMBULATORY_CARE_PROVIDER_SITE_OTHER): Payer: Self-pay | Admitting: Nurse Practitioner

## 2018-09-06 ENCOUNTER — Encounter
Admission: RE | Disposition: A | Discharge status: Home / Self Care | Payer: Self-pay | Source: Home / Self Care | Attending: Vascular Surgery

## 2018-09-06 DIAGNOSIS — M109 Gout, unspecified: Secondary | ICD-10-CM | POA: Insufficient documentation

## 2018-09-06 DIAGNOSIS — I132 Hypertensive heart and chronic kidney disease with heart failure and with stage 5 chronic kidney disease, or end stage renal disease: Secondary | ICD-10-CM | POA: Insufficient documentation

## 2018-09-06 DIAGNOSIS — Z87891 Personal history of nicotine dependence: Secondary | ICD-10-CM | POA: Diagnosis not present

## 2018-09-06 DIAGNOSIS — Z8249 Family history of ischemic heart disease and other diseases of the circulatory system: Secondary | ICD-10-CM | POA: Diagnosis not present

## 2018-09-06 DIAGNOSIS — L97211 Non-pressure chronic ulcer of right calf limited to breakdown of skin: Secondary | ICD-10-CM | POA: Diagnosis not present

## 2018-09-06 DIAGNOSIS — E785 Hyperlipidemia, unspecified: Secondary | ICD-10-CM | POA: Diagnosis not present

## 2018-09-06 DIAGNOSIS — I70232 Atherosclerosis of native arteries of right leg with ulceration of calf: Secondary | ICD-10-CM | POA: Insufficient documentation

## 2018-09-06 DIAGNOSIS — Z992 Dependence on renal dialysis: Secondary | ICD-10-CM

## 2018-09-06 DIAGNOSIS — Z95820 Peripheral vascular angioplasty status with implants and grafts: Secondary | ICD-10-CM | POA: Insufficient documentation

## 2018-09-06 DIAGNOSIS — Y832 Surgical operation with anastomosis, bypass or graft as the cause of abnormal reaction of the patient, or of later complication, without mention of misadventure at the time of the procedure: Secondary | ICD-10-CM | POA: Diagnosis not present

## 2018-09-06 DIAGNOSIS — Z7902 Long term (current) use of antithrombotics/antiplatelets: Secondary | ICD-10-CM | POA: Insufficient documentation

## 2018-09-06 DIAGNOSIS — N186 End stage renal disease: Secondary | ICD-10-CM

## 2018-09-06 DIAGNOSIS — Z7901 Long term (current) use of anticoagulants: Secondary | ICD-10-CM | POA: Diagnosis not present

## 2018-09-06 DIAGNOSIS — G709 Myoneural disorder, unspecified: Secondary | ICD-10-CM | POA: Insufficient documentation

## 2018-09-06 DIAGNOSIS — Z79899 Other long term (current) drug therapy: Secondary | ICD-10-CM | POA: Diagnosis not present

## 2018-09-06 DIAGNOSIS — I252 Old myocardial infarction: Secondary | ICD-10-CM | POA: Insufficient documentation

## 2018-09-06 DIAGNOSIS — T82858A Stenosis of vascular prosthetic devices, implants and grafts, initial encounter: Secondary | ICD-10-CM | POA: Insufficient documentation

## 2018-09-06 DIAGNOSIS — I509 Heart failure, unspecified: Secondary | ICD-10-CM | POA: Insufficient documentation

## 2018-09-06 HISTORY — PX: A/V FISTULAGRAM: CATH118298

## 2018-09-06 LAB — POTASSIUM (ARMC VASCULAR LAB ONLY): Potassium (ARMC vascular lab): 4.4 (ref 3.5–5.1)

## 2018-09-06 SURGERY — A/V FISTULAGRAM
Anesthesia: Moderate Sedation | Laterality: Right

## 2018-09-06 MED ORDER — HEPARIN SODIUM (PORCINE) 1000 UNIT/ML IJ SOLN
INTRAMUSCULAR | Status: DC | PRN
  Administered 2018-09-06: 3000 [IU] via INTRAVENOUS

## 2018-09-06 MED ORDER — HYDRALAZINE HCL 20 MG/ML IJ SOLN
10.0000 mg | Freq: Once | INTRAMUSCULAR | Status: AC
  Administered 2018-09-06: 11:00:00 10 mg via INTRAVENOUS

## 2018-09-06 MED ORDER — FENTANYL CITRATE (PF) 100 MCG/2ML IJ SOLN
INTRAMUSCULAR | Status: AC
  Filled 2018-09-06: qty 2

## 2018-09-06 MED ORDER — HYDROMORPHONE HCL 1 MG/ML IJ SOLN: 1.0000 mg | Freq: Once | INTRAMUSCULAR | Status: DC | PRN

## 2018-09-06 MED ORDER — METHYLPREDNISOLONE SODIUM SUCC 125 MG IJ SOLR
125.0000 mg | Freq: Once | INTRAMUSCULAR | Status: AC | PRN
  Administered 2018-09-06: 09:00:00 125 mg via INTRAVENOUS

## 2018-09-06 MED ORDER — HEPARIN SODIUM (PORCINE) 1000 UNIT/ML IJ SOLN
INTRAMUSCULAR | Status: AC
  Filled 2018-09-06: qty 1

## 2018-09-06 MED ORDER — FAMOTIDINE 20 MG PO TABS
40.0000 mg | ORAL_TABLET | Freq: Once | ORAL | Status: AC | PRN
  Administered 2018-09-06: 40 mg via ORAL

## 2018-09-06 MED ORDER — CEFAZOLIN SODIUM-DEXTROSE 1-4 GM/50ML-% IV SOLN
INTRAVENOUS | Status: AC
  Filled 2018-09-06: qty 50

## 2018-09-06 MED ORDER — METHYLPREDNISOLONE SODIUM SUCC 125 MG IJ SOLR
INTRAMUSCULAR | Status: AC
  Administered 2018-09-06: 09:00:00 125 mg via INTRAVENOUS
  Filled 2018-09-06: qty 2

## 2018-09-06 MED ORDER — FENTANYL CITRATE (PF) 100 MCG/2ML IJ SOLN
INTRAMUSCULAR | Status: DC | PRN
  Administered 2018-09-06: 25 ug via INTRAVENOUS
  Administered 2018-09-06: 50 ug via INTRAVENOUS
  Administered 2018-09-06: 25 ug via INTRAVENOUS

## 2018-09-06 MED ORDER — DIPHENHYDRAMINE HCL 50 MG/ML IJ SOLN
50.0000 mg | Freq: Once | INTRAMUSCULAR | Status: AC | PRN
  Administered 2018-09-06: 09:00:00 50 mg via INTRAVENOUS

## 2018-09-06 MED ORDER — HEPARIN (PORCINE) IN NACL 1000-0.9 UT/500ML-% IV SOLN
INTRAVENOUS | Status: AC
  Filled 2018-09-06: qty 1000

## 2018-09-06 MED ORDER — FAMOTIDINE 20 MG PO TABS
ORAL_TABLET | ORAL | Status: AC
  Administered 2018-09-06: 40 mg via ORAL
  Filled 2018-09-06: qty 2

## 2018-09-06 MED ORDER — MIDAZOLAM HCL 2 MG/2ML IJ SOLN
INTRAMUSCULAR | Status: DC | PRN
  Administered 2018-09-06: 2 mg via INTRAVENOUS

## 2018-09-06 MED ORDER — LIDOCAINE-EPINEPHRINE (PF) 1 %-1:200000 IJ SOLN
INTRAMUSCULAR | Status: AC
  Filled 2018-09-06: qty 30

## 2018-09-06 MED ORDER — MIDAZOLAM HCL 2 MG/2ML IJ SOLN
INTRAMUSCULAR | Status: AC
  Filled 2018-09-06: qty 2

## 2018-09-06 MED ORDER — METOPROLOL SUCCINATE ER 50 MG PO TB24: 25.0000 mg | ORAL_TABLET | Freq: Every day | ORAL | Status: DC

## 2018-09-06 MED ORDER — HYDRALAZINE HCL 20 MG/ML IJ SOLN
INTRAMUSCULAR | Status: AC
  Administered 2018-09-06: 11:00:00 10 mg via INTRAVENOUS
  Filled 2018-09-06: qty 1

## 2018-09-06 MED ORDER — MIDAZOLAM HCL 2 MG/ML PO SYRP: 8.0000 mg | ORAL_SOLUTION | Freq: Once | ORAL | Status: DC | PRN

## 2018-09-06 MED ORDER — SODIUM CHLORIDE 0.9 % IV SOLN
INTRAVENOUS | Status: DC
  Administered 2018-09-06: 09:00:00 via INTRAVENOUS

## 2018-09-06 MED ORDER — ONDANSETRON HCL 4 MG/2ML IJ SOLN: 4.0000 mg | Freq: Four times a day (QID) | INTRAMUSCULAR | Status: DC | PRN

## 2018-09-06 MED ORDER — DIPHENHYDRAMINE HCL 50 MG/ML IJ SOLN
INTRAMUSCULAR | Status: AC
  Administered 2018-09-06: 50 mg via INTRAVENOUS
  Filled 2018-09-06: qty 1

## 2018-09-06 SURGICAL SUPPLY — 8 items
BALLN DORADO 10X80X80 (BALLOONS) ×2
BALLOON DORADO 10X80X80 (BALLOONS) ×1 IMPLANT
CANNULA 5F STIFF (CANNULA) ×2 IMPLANT
DEVICE PRESTO INFLATION (MISCELLANEOUS) ×2 IMPLANT
PACK ANGIOGRAPHY (CUSTOM PROCEDURE TRAY) ×2 IMPLANT
SHEATH BRITE TIP 6FRX5.5 (SHEATH) ×2 IMPLANT
SUT MNCRL AB 4-0 PS2 18 (SUTURE) ×2 IMPLANT
WIRE MAGIC TOR.035 180C (WIRE) ×2 IMPLANT

## 2018-09-06 NOTE — H&P (Signed)
 [facility] History & Physical Update  The patient was interviewed and re-examined.  The patient's previous History and Physical has been reviewed and is unchanged.  There is no change in the plan of care. We plan to proceed with the scheduled procedure.  Leotis Pain, MD  09/06/2018, 8:22 AM

## 2018-09-06 NOTE — Discharge Instructions (Signed)
Moderate Conscious Sedation, Adult, Care After  These instructions provide you with information about caring for yourself after your procedure. Your health care provider may also give you more specific instructions. Your treatment has been planned according to current medical practices, but problems sometimes occur. Call your health care provider if you have any problems or questions after your procedure.  What can I expect after the procedure?  After your procedure, it is common:  · To feel sleepy for several hours.  · To feel clumsy and have poor balance for several hours.  · To have poor judgment for several hours.  · To vomit if you eat too soon.  Follow these instructions at home:  For at least 24 hours after the procedure:    · Do not:  ? Participate in activities where you could fall or become injured.  ? Drive.  ? Use heavy machinery.  ? Drink alcohol.  ? Take sleeping pills or medicines that cause drowsiness.  ? Make important decisions or sign legal documents.  ? Take care of children on your own.  · Rest.  Eating and drinking  · Follow the diet recommended by your health care provider.  · If you vomit:  ? Drink water, juice, or soup when you can drink without vomiting.  ? Make sure you have little or no nausea before eating solid foods.  General instructions  · Have a responsible adult stay with you until you are awake and alert.  · Take over-the-counter and prescription medicines only as told by your health care provider.  · If you smoke, do not smoke without supervision.  · Keep all follow-up visits as told by your health care provider. This is important.  Contact a health care provider if:  · You keep feeling nauseous or you keep vomiting.  · You feel light-headed.  · You develop a rash.  · You have a fever.  Get help right away if:  · You have trouble breathing.  This information is not intended to replace advice given to you by your health care provider. Make sure you discuss any questions you have  with your health care provider.  Document Released: 02/13/2013 Document Revised: 09/28/2015 Document Reviewed: 08/15/2015  Elsevier Interactive Patient Education © 2019 Elsevier Inc[employer]  Dialysis Fistulogram, Care After  This sheet gives you information about how to care for yourself after your procedure. Your health care provider may also give you more specific instructions. If you have problems or questions, contact your health care provider.  What can I expect after the procedure?  After the procedure, it is common to have:  · A small amount of discomfort in the area where the small, thin tube (catheter) was placed for the procedure.  · A small amount of bruising around the fistula.  · Sleepiness and tiredness (fatigue).  Follow these instructions at home:  Activity    · Rest at home and do not lift anything that is heavier than 5 lb (2.3 kg) on the day after your procedure.  · Return to your normal activities as told by your health care provider. Ask your health care provider what activities are safe for you.  · Do not drive or use heavy machinery while taking prescription pain medicine.  · Do not drive for 24 hours if you were given a medicine to help you relax (sedative) during your procedure.  Medicines    · Take over-the-counter and prescription medicines only as told by your health care   provider.  Puncture site care  · Follow instructions from your health care provider about how to take care of the site where catheters were inserted. Make sure you:  ? Wash your hands with soap and water before you change your bandage (dressing). If soap and water are not available, use hand sanitizer.  ? Change your dressing as told by your health care provider.  ? Leave stitches (sutures), skin glue, or adhesive strips in place. These skin closures may need to stay in place for 2 weeks or longer. If adhesive strip edges start to loosen and curl up, you may trim the loose edges. Do not remove adhesive strips completely unless  your health care provider tells you to do that.  · Check your puncture area every day for signs of infection. Check for:  ? Redness, swelling, or pain.  ? Fluid or blood.  ? Warmth.  ? Pus or a bad smell.  General instructions  · Do not take baths, swim, or use a hot tub until your health care provider approves. Ask your health care provider if you may take showers. You may only be allowed to take sponge baths.  · Monitor your dialysis fistula closely. Check to make sure that you can feel a vibration or buzz (a thrill) when you put your fingers over the fistula.  · Prevent damage to your graft or fistula:  ? Do not wear tight-fitting clothing or jewelry on the arm or leg that has your graft or fistula.  ? Tell all your health care providers that you have a dialysis fistula or graft.  ? Do not allow blood draws, IVs, or blood pressure readings to be done in the arm that has your fistula or graft.  ? Do not allow flu shots or vaccinations in the arm with your fistula or graft.  · Keep all follow-up visits as told by your health care provider. This is important.  Contact a health care provider if:  · You have redness, swelling, or pain at the site where the catheter was put in.  · You have fluid or blood coming from the catheter site.  · The catheter site feels warm to the touch.  · You have pus or a bad smell coming from the catheter site.  · You have a fever or chills.  Get help right away if:  · You feel weak.  · You have trouble balancing.  · You have trouble moving your arms or legs.  · You have problems with your speech or vision.  · You can no longer feel a vibration or buzz when you put your fingers over your dialysis fistula.  · The limb that was used for the procedure:  ? Swells.  ? Is painful.  ? Is cold.  ? Is discolored, such as blue or pale white.  · You have chest pain or shortness of breath.  Summary  · After a dialysis fistulogram, it is common to have a small amount of discomfort or bruising in the  area where the small, thin tube (catheter) was placed.  · Rest at home on the day after your procedure. Return to your normal activities as told by your health care provider.  · Take over-the-counter and prescription medicines only as told by your health care provider.  · Follow instructions from your health care provider about how to take care of the site where the catheter was inserted.  · Keep all follow-up visits as told by your health   care provider.  This information is not intended to replace advice given to you by your health care provider. Make sure you discuss any questions you have with your health care provider.  Document Released: 09/09/2013 Document Revised: 05/26/2017 Document Reviewed: 05/26/2017  Elsevier Interactive Patient Education © 2019 Elsevier Inc.

## 2018-09-06 NOTE — Telephone Encounter (Signed)
Freda Munro from [place] had called stating the patient was confused on why he was going to the wound center. I spoke with Arna Medici NP and she advise that the wound is very extensive and need specialize care so we want him to get the best care to help heal the wound.I spoke with patient friend [name] and inform her with Arna Medici NP medical advice

## 2018-09-06 NOTE — Op Note (Signed)
[facility]    OPERATIVE NOTE   PROCEDURE: 1.  Right brachial artery to axillary vein arteriovenous graft cannulation under ultrasound guidance 2.  Right arm shuntogram 3.  Percutaneous transluminal angioplasty of the right innominate vein and SVC with a 10 mm x 8 cm high pressure angioplasty balloon  PRE-OPERATIVE DIAGNOSIS: 1. ESRD 2. Malfunctioning right brachial artery to axillary vein arteriovenous graft  POST-OPERATIVE DIAGNOSIS: same as above   SURGEON: Leotis Pain, MD  ANESTHESIA: local with MCS  ESTIMATED BLOOD LOSS: 3 cc  FINDING(S): 1. The AV graft itself looked good and was patent.  There was mild narrowing at the venous anastomosis only in the 20% range.  The right axillary and subclavian veins looked good.  There was then narrowing around the catheter in the right innominate vein and superior vena cava that appeared to be relatively high-grade in the 80% range.  SPECIMEN(S):  None  CONTRAST: 35 cc  FLUORO TIME: 1.4 minutes  MODERATE CONSCIOUS SEDATION TIME:  Approximately 20 minutes using 2 mg of Versed and 100 mcg of Fentanyl  INDICATIONS: Johnny [name]. is a 68 y.o. male who presents with malfunctioning right arm arteriovenous graft.  His arm is markedly swollen and they have not been able to use the graft yet. The patient is scheduled for right shuntogram.  The patient is aware the risks include but are not limited to: bleeding, infection, thrombosis of the cannulated access, and possible anaphylactic reaction to the contrast.  The patient is aware of the risks of the procedure and elects to proceed forward.  DESCRIPTION: After full informed written consent was obtained, the patient was brought back to the angiography suite and placed supine upon the angiography table.  The patient was connected to monitoring equipment. Moderate conscious sedation was administered during a face to face encounter throughout the procedure with my  supervision of the RN administering medicines and monitoring the patient's vital signs, pulse oximetry, telemetry and mental status throughout from the start of the procedure until the patient was taken to the recovery room The right arm was prepped and draped in the standard fashion for a percutaneous access intervention.  Under ultrasound guidance, the right brachial artery to axillary vein arteriovenous graft was cannulated with a micropuncture needle under direct ultrasound guidance were it was patent and a permanent image was performed.  The microwire was advanced into the graft and the needle was exchanged for the a microsheath.  I then upsized to a 6 Fr Sheath and imaging was performed.  Hand injections were completed to image the access including the central venous system. This demonstrated that the AV graft itself looked good and was patent.  There was mild narrowing at the venous anastomosis only in the 20% range.  The right axillary and subclavian veins looked good.  There was then narrowing around the catheter in the right innominate vein and superior vena cava that appeared to be relatively high-grade in the 80% range.  Based on the images, this patient will need intervention to the central venous stenosis. I then gave the patient 3000 units of intravenous heparin.  I then crossed the stenosis with a Magic Tourqe wire.  Based on the imaging, a 10 mm x 8 cm high-pressure angioplasty balloon was selected.  The balloon was centered around the stenosis and inflated to 14 ATM for 1 minute(s).  On completion imaging, a close to 50% residual stenosis was present but with the catheter in place it is unlikely  that we would be able to improve on this much.  As the graft had not yet been used, I did not feel comfortable taking the catheter out today.  We will ask the dialysis center to use his graft for the next several treatments.  He has an upcoming procedure on his leg in the next week or so and if his graft  works okay, I think we should take his catheter out at that time and potentially balloon the area larger. The wire and balloon were removed from the sheath.  A 4-0 Monocryl purse-string suture was sewn around the sheath.  The sheath was removed while tying down the suture.  A sterile bandage was applied to the puncture site.  COMPLICATIONS: None  CONDITION: Stable   Leotis Pain  09/06/2018 10:19 AM    This note was created with Dragon Medical transcription system. Any errors in dictation are purely unintentional.

## 2018-09-07 ENCOUNTER — Other Ambulatory Visit (INDEPENDENT_AMBULATORY_CARE_PROVIDER_SITE_OTHER): Payer: Self-pay | Admitting: Nurse Practitioner

## 2018-09-11 ENCOUNTER — Telehealth (INDEPENDENT_AMBULATORY_CARE_PROVIDER_SITE_OTHER): Payer: Self-pay | Admitting: Nurse Practitioner

## 2018-09-11 NOTE — Telephone Encounter (Signed)
Annette with [facility] [phone] called stating that patient has cellulitis of R lower leg and that he is showing signs of infection - slight odor, increased light yellow drainage, wound very open. Pt scheduled for debridement on Thursday 09/13/18 at 915.  Spoke with Arna Medici. She verbalized order for antibiotic Keflex 500mg  PO TID X 10 days. Medication called in to [facility] in [place].    Spoke with [name], patient care taker, she is aware of medication being sent into pharmacy.  Spoke with Anne Ng, she is also made aware of patient starting antibiotic. AS, CMA

## 2018-09-12 MED ORDER — CLINDAMYCIN PHOSPHATE 300 MG/50ML IV SOLN
300.0000 mg | Freq: Once | INTRAVENOUS | Status: AC
  Administered 2018-09-13: 300 mg via INTRAVENOUS

## 2018-09-13 ENCOUNTER — Encounter
Admission: RE | Disposition: A | Discharge status: Home / Self Care | Payer: Self-pay | Source: Home / Self Care | Attending: Vascular Surgery

## 2018-09-13 ENCOUNTER — Ambulatory Visit
Admission: RE | Admit: 2018-09-13 | Discharge: 2018-09-13 | Disposition: A | Discharge status: Home / Self Care | Payer: Medicare Other | Attending: Vascular Surgery | Admitting: Vascular Surgery

## 2018-09-13 ENCOUNTER — Ambulatory Visit: Payer: Medicare Other | Admitting: Certified Registered Nurse Anesthetist

## 2018-09-13 ENCOUNTER — Other Ambulatory Visit: Payer: Self-pay

## 2018-09-13 DIAGNOSIS — L97211 Non-pressure chronic ulcer of right calf limited to breakdown of skin: Secondary | ICD-10-CM | POA: Diagnosis not present

## 2018-09-13 DIAGNOSIS — Z7901 Long term (current) use of anticoagulants: Secondary | ICD-10-CM | POA: Insufficient documentation

## 2018-09-13 DIAGNOSIS — N186 End stage renal disease: Secondary | ICD-10-CM

## 2018-09-13 DIAGNOSIS — M109 Gout, unspecified: Secondary | ICD-10-CM | POA: Diagnosis not present

## 2018-09-13 DIAGNOSIS — Z79899 Other long term (current) drug therapy: Secondary | ICD-10-CM | POA: Insufficient documentation

## 2018-09-13 DIAGNOSIS — L97909 Non-pressure chronic ulcer of unspecified part of unspecified lower leg with unspecified severity: Secondary | ICD-10-CM

## 2018-09-13 DIAGNOSIS — I252 Old myocardial infarction: Secondary | ICD-10-CM | POA: Diagnosis not present

## 2018-09-13 DIAGNOSIS — I871 Compression of vein: Secondary | ICD-10-CM | POA: Insufficient documentation

## 2018-09-13 DIAGNOSIS — G709 Myoneural disorder, unspecified: Secondary | ICD-10-CM | POA: Insufficient documentation

## 2018-09-13 DIAGNOSIS — I70299 Other atherosclerosis of native arteries of extremities, unspecified extremity: Secondary | ICD-10-CM

## 2018-09-13 DIAGNOSIS — I70242 Atherosclerosis of native arteries of left leg with ulceration of calf: Secondary | ICD-10-CM | POA: Insufficient documentation

## 2018-09-13 DIAGNOSIS — Z992 Dependence on renal dialysis: Secondary | ICD-10-CM | POA: Insufficient documentation

## 2018-09-13 DIAGNOSIS — I509 Heart failure, unspecified: Secondary | ICD-10-CM | POA: Diagnosis not present

## 2018-09-13 DIAGNOSIS — Z95820 Peripheral vascular angioplasty status with implants and grafts: Secondary | ICD-10-CM | POA: Insufficient documentation

## 2018-09-13 DIAGNOSIS — Z8249 Family history of ischemic heart disease and other diseases of the circulatory system: Secondary | ICD-10-CM | POA: Diagnosis not present

## 2018-09-13 DIAGNOSIS — Z87891 Personal history of nicotine dependence: Secondary | ICD-10-CM | POA: Diagnosis not present

## 2018-09-13 DIAGNOSIS — I7092 Chronic total occlusion of artery of the extremities: Secondary | ICD-10-CM

## 2018-09-13 DIAGNOSIS — E785 Hyperlipidemia, unspecified: Secondary | ICD-10-CM | POA: Diagnosis not present

## 2018-09-13 DIAGNOSIS — Z7902 Long term (current) use of antithrombotics/antiplatelets: Secondary | ICD-10-CM | POA: Insufficient documentation

## 2018-09-13 DIAGNOSIS — I70239 Atherosclerosis of native arteries of right leg with ulceration of unspecified site: Secondary | ICD-10-CM

## 2018-09-13 DIAGNOSIS — I132 Hypertensive heart and chronic kidney disease with heart failure and with stage 5 chronic kidney disease, or end stage renal disease: Secondary | ICD-10-CM | POA: Insufficient documentation

## 2018-09-13 DIAGNOSIS — T82898A Other specified complication of vascular prosthetic devices, implants and grafts, initial encounter: Secondary | ICD-10-CM

## 2018-09-13 DIAGNOSIS — L97919 Non-pressure chronic ulcer of unspecified part of right lower leg with unspecified severity: Secondary | ICD-10-CM

## 2018-09-13 HISTORY — PX: LOWER EXTREMITY VENOGRAPHY: CATH118253

## 2018-09-13 HISTORY — PX: LOWER EXTREMITY ANGIOGRAPHY: CATH118251

## 2018-09-13 HISTORY — PX: DIALYSIS/PERMA CATHETER REMOVAL: CATH118289

## 2018-09-13 LAB — POTASSIUM (ARMC VASCULAR LAB ONLY): Potassium (ARMC vascular lab): 4.2 (ref 3.5–5.1)

## 2018-09-13 SURGERY — LOWER EXTREMITY ANGIOGRAPHY
Anesthesia: General | Site: Leg Lower | Laterality: Right

## 2018-09-13 SURGERY — LOWER EXTREMITY VENOGRAPHY
Anesthesia: General | Laterality: Right

## 2018-09-13 MED ORDER — HYDROCODONE-ACETAMINOPHEN 5-325 MG PO TABS: 1.0000 | ORAL_TABLET | Freq: Four times a day (QID) | ORAL | 0 refills | Status: DC | PRN

## 2018-09-13 MED ORDER — FAMOTIDINE 20 MG PO TABS
40.0000 mg | ORAL_TABLET | Freq: Once | ORAL | Status: AC | PRN
  Administered 2018-09-13: 40 mg via ORAL

## 2018-09-13 MED ORDER — HEPARIN (PORCINE) IN NACL 1000-0.9 UT/500ML-% IV SOLN
INTRAVENOUS | Status: AC
  Filled 2018-09-13: qty 1000

## 2018-09-13 MED ORDER — SODIUM CHLORIDE 0.9 % IV SOLN
INTRAVENOUS | Status: DC
  Administered 2018-09-13: 10:00:00 via INTRAVENOUS

## 2018-09-13 MED ORDER — IOHEXOL 300 MG/ML  SOLN
INTRAMUSCULAR | Status: DC | PRN
  Administered 2018-09-13: 40 mL via INTRAVENOUS

## 2018-09-13 MED ORDER — HYDRALAZINE HCL 20 MG/ML IJ SOLN: 5.0000 mg | INTRAMUSCULAR | Status: DC | PRN

## 2018-09-13 MED ORDER — FENTANYL CITRATE (PF) 100 MCG/2ML IJ SOLN
INTRAMUSCULAR | Status: DC | PRN
  Administered 2018-09-13 (×4): 25 ug via INTRAVENOUS

## 2018-09-13 MED ORDER — LIDOCAINE HCL 2 % IJ SOLN
INTRAMUSCULAR | Status: AC
  Filled 2018-09-13: qty 10

## 2018-09-13 MED ORDER — DIPHENHYDRAMINE HCL 50 MG/ML IJ SOLN
50.0000 mg | Freq: Once | INTRAMUSCULAR | Status: AC | PRN
  Administered 2018-09-13: 50 mg via INTRAVENOUS

## 2018-09-13 MED ORDER — LIDOCAINE HCL (CARDIAC) PF 100 MG/5ML IV SOSY
PREFILLED_SYRINGE | INTRAVENOUS | Status: DC | PRN
  Administered 2018-09-13: 80 mg via INTRAVENOUS

## 2018-09-13 MED ORDER — MIDAZOLAM HCL 2 MG/ML PO SYRP: 8.0000 mg | ORAL_SOLUTION | Freq: Once | ORAL | Status: DC | PRN

## 2018-09-13 MED ORDER — LABETALOL HCL 5 MG/ML IV SOLN
INTRAVENOUS | Status: AC
  Administered 2018-09-13: 20 mg via INTRAVENOUS
  Filled 2018-09-13: qty 4

## 2018-09-13 MED ORDER — PROPOFOL 10 MG/ML IV BOLUS
INTRAVENOUS | Status: AC
  Filled 2018-09-13: qty 20

## 2018-09-13 MED ORDER — CLINDAMYCIN PHOSPHATE 300 MG/50ML IV SOLN
INTRAVENOUS | Status: AC
  Filled 2018-09-13: qty 50

## 2018-09-13 MED ORDER — ONDANSETRON HCL 4 MG/2ML IJ SOLN: 4.0000 mg | Freq: Once | INTRAMUSCULAR | Status: DC | PRN

## 2018-09-13 MED ORDER — HYDROMORPHONE HCL 1 MG/ML IJ SOLN: 1.0000 mg | Freq: Once | INTRAMUSCULAR | Status: DC | PRN

## 2018-09-13 MED ORDER — METHYLPREDNISOLONE SODIUM SUCC 125 MG IJ SOLR
125.0000 mg | Freq: Once | INTRAMUSCULAR | Status: AC | PRN
  Administered 2018-09-13: 125 mg via INTRAVENOUS

## 2018-09-13 MED ORDER — FENTANYL CITRATE (PF) 100 MCG/2ML IJ SOLN
INTRAMUSCULAR | Status: AC
  Filled 2018-09-13: qty 2

## 2018-09-13 MED ORDER — ONDANSETRON HCL 4 MG/2ML IJ SOLN: 4.0000 mg | Freq: Four times a day (QID) | INTRAMUSCULAR | Status: DC | PRN

## 2018-09-13 MED ORDER — SODIUM CHLORIDE 0.9% FLUSH: 3.0000 mL | Freq: Two times a day (BID) | INTRAVENOUS | Status: DC

## 2018-09-13 MED ORDER — LABETALOL HCL 5 MG/ML IV SOLN: 10.0000 mg | INTRAVENOUS | Status: DC | PRN

## 2018-09-13 MED ORDER — SODIUM CHLORIDE 0.9 % IV SOLN: 250.0000 mL | INTRAVENOUS | Status: DC | PRN

## 2018-09-13 MED ORDER — METHYLPREDNISOLONE SODIUM SUCC 125 MG IJ SOLR
INTRAMUSCULAR | Status: AC
  Administered 2018-09-13: 125 mg via INTRAVENOUS
  Filled 2018-09-13: qty 2

## 2018-09-13 MED ORDER — ONDANSETRON HCL 4 MG/2ML IJ SOLN
4.0000 mg | Freq: Four times a day (QID) | INTRAMUSCULAR | Status: DC | PRN
  Administered 2018-09-13: 4 mg via INTRAVENOUS

## 2018-09-13 MED ORDER — SODIUM CHLORIDE 0.9 % IV SOLN: INTRAVENOUS | Status: DC

## 2018-09-13 MED ORDER — LIDOCAINE-EPINEPHRINE (PF) 1 %-1:200000 IJ SOLN
INTRAMUSCULAR | Status: AC
  Filled 2018-09-13: qty 30

## 2018-09-13 MED ORDER — FENTANYL CITRATE (PF) 100 MCG/2ML IJ SOLN: 25.0000 ug | INTRAMUSCULAR | Status: DC | PRN

## 2018-09-13 MED ORDER — ACETAMINOPHEN 325 MG PO TABS: 650.0000 mg | ORAL_TABLET | ORAL | Status: DC | PRN

## 2018-09-13 MED ORDER — SODIUM CHLORIDE 0.9% FLUSH: 3.0000 mL | INTRAVENOUS | Status: DC | PRN

## 2018-09-13 MED ORDER — EPHEDRINE SULFATE 50 MG/ML IJ SOLN
INTRAMUSCULAR | Status: DC | PRN
  Administered 2018-09-13: 5 mg via INTRAVENOUS
  Administered 2018-09-13: 10 mg via INTRAVENOUS
  Administered 2018-09-13 (×3): 5 mg via INTRAVENOUS

## 2018-09-13 MED ORDER — DIPHENHYDRAMINE HCL 50 MG/ML IJ SOLN
INTRAMUSCULAR | Status: AC
  Administered 2018-09-13: 50 mg via INTRAVENOUS
  Filled 2018-09-13: qty 1

## 2018-09-13 MED ORDER — FAMOTIDINE 20 MG PO TABS
ORAL_TABLET | ORAL | Status: AC
  Administered 2018-09-13: 40 mg via ORAL
  Filled 2018-09-13: qty 1

## 2018-09-13 MED ORDER — PROPOFOL 10 MG/ML IV BOLUS
INTRAVENOUS | Status: DC | PRN
  Administered 2018-09-13: 110 mg via INTRAVENOUS

## 2018-09-13 MED ORDER — LABETALOL HCL 5 MG/ML IV SOLN
20.0000 mg | Freq: Once | INTRAVENOUS | Status: AC
  Administered 2018-09-13: 10:00:00 20 mg via INTRAVENOUS

## 2018-09-13 MED ORDER — ONDANSETRON HCL 4 MG/2ML IJ SOLN
INTRAMUSCULAR | Status: AC
  Filled 2018-09-13: qty 2

## 2018-09-13 SURGICAL SUPPLY — 12 items
BALLN ATG 12X6X80 (BALLOONS) ×4
BALLOON ATG 12X6X80 (BALLOONS) ×3 IMPLANT
CATH PIG 70CM (CATHETERS) ×4 IMPLANT
DEVICE PRESTO INFLATION (MISCELLANEOUS) ×4 IMPLANT
DEVICE STARCLOSE SE CLOSURE (Vascular Products) ×4 IMPLANT
GLIDEWIRE ADV .035X260CM (WIRE) ×4 IMPLANT
PACK ANGIOGRAPHY (CUSTOM PROCEDURE TRAY) ×4 IMPLANT
SHEATH 9FRX11 (SHEATH) ×4 IMPLANT
SHEATH BRITE TIP 5FRX11 (SHEATH) ×4 IMPLANT
SYR MEDRAD MARK 7 150ML (SYRINGE) ×4 IMPLANT
TUBING CONTRAST HIGH PRESS 72 (TUBING) ×4 IMPLANT
WIRE J 3MM .035X145CM (WIRE) ×4 IMPLANT

## 2018-09-13 NOTE — H&P (Signed)
[facility] History & Physical Update  The patient was interviewed and re-examined.  The patient's previous History and Physical has been reviewed and is unchanged.  There is no change in the plan of care. We plan to proceed with the scheduled procedure.  Leotis Pain, MD  09/13/2018, 9:22 AM

## 2018-09-13 NOTE — Anesthesia Post-op Follow-up Note (Signed)
Anesthesia QCDR form completed.        

## 2018-09-13 NOTE — Anesthesia Preprocedure Evaluation (Signed)
Anesthesia Evaluation  Patient identified by MRN, date of birth, ID band Patient awake    Reviewed: Allergy & Precautions, NPO status , Patient's Chart, lab work & pertinent test results  History of Anesthesia Complications Negative for: history of anesthetic complications  Airway Mallampati: II  TM Distance: >3 FB Neck ROM: Full    Dental  (+) Upper Dentures, Lower Dentures   Pulmonary neg sleep apnea, neg COPD, former smoker,    breath sounds clear to auscultation- rhonchi (-) wheezing      Cardiovascular hypertension, Pt. on medications + CAD, + Past MI, + Peripheral Vascular Disease and +CHF  (-) Cardiac Stents and (-) CABG  Rhythm:Regular Rate:Normal - Systolic murmurs and - Diastolic murmurs Echo 12/6759:  - Left ventricle: The cavity size was normal. Wall thickness was   increased in a pattern of severe LVH. Systolic function was   hyperdynamic. The estimated ejection fraction was in the range of   70% to 75%. Wall motion was normal; there were no regional wall   motion abnormalities. Doppler parameters are consistent with   abnormal left ventricular relaxation (grade 1 diastolic   dysfunction). Doppler parameters are consistent with high   ventricular filling pressure. - Mitral valve: Calcified annulus. - Right ventricle: The cavity size was normal. Systolic function   was normal. - Pericardium, extracardiac: A small pericardial effusion was   identified anterior to the heart.   Neuro/Psych PSYCHIATRIC DISORDERS Anxiety negative neurological ROS     GI/Hepatic negative GI ROS, Neg liver ROS,   Endo/Other  neg diabetes  Renal/GU Dialysis and ESRFRenal disease     Musculoskeletal negative musculoskeletal ROS (+)   Abdominal (+) - obese,   Peds  Hematology  (+) anemia ,   Anesthesia Other Findings Past Medical History: No date: Anemia No date: Anxiety No date: CHF (congestive heart failure) (HCC) No  date: Chronic kidney disease     Comment:  esrd dialysis m/w/f No date: Gout No date: Hyperlipidemia No date: Hypertension 2010: Myocardial infarction [facility])     Comment:  10 years ago 2020: Neuromuscular disorder (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 No date: Peripheral vascular disease ([place])   Reproductive/Obstetrics                             Anesthesia Physical Anesthesia Plan  ASA: IV  Anesthesia Plan: General   Post-op Pain Management:    Induction: Intravenous  PONV Risk Score and Plan: 1 and Ondansetron  Airway Management Planned: LMA  Additional Equipment:   Intra-op Plan:   Post-operative Plan:   Informed Consent: I have reviewed the patients History and Physical, chart, labs and discussed the procedure including the risks, benefits and alternatives for the proposed anesthesia with the patient or authorized representative who has indicated his/her understanding and acceptance.     Dental advisory given  Plan Discussed with: CRNA and Anesthesiologist  Anesthesia Plan Comments: (Pt has shellfish allergy, premedicating with solumedrol, famotidine and benadryl. No hx of reaction to contrast when premedicated)        Anesthesia Quick Evaluation

## 2018-09-13 NOTE — Transfer of Care (Signed)
Immediate Anesthesia Transfer of Care Note  Patient: Johnny [name].  Procedure(s) Performed: LOWER EXTREMITY ANGIOGRAPHY (Right Leg Lower) LOWER EXTREMITY VENOGRAPHY (Right ) DIALYSIS/PERMA CATHETER REMOVAL (N/A )  Patient Location: PACU  Anesthesia Type:General  Level of Consciousness: drowsy  Airway & Oxygen Therapy: Patient Spontanous Breathing and Patient connected to face mask oxygen  Post-op Assessment: Report given to RN and Post -op Vital signs reviewed and stable  Post vital signs: Reviewed and stable  Last Vitals:  Vitals Value Taken Time  BP 187/85 09/13/2018 12:04 PM  Temp    Pulse 75 09/13/2018 12:05 PM  Resp 20 09/13/2018 12:05 PM  SpO2 100 % 09/13/2018 12:05 PM  Vitals shown include unvalidated device data.  Last Pain:  Vitals:   09/13/18 0912  TempSrc: Oral  PainSc: 0-No pain         Complications: No apparent anesthesia complications

## 2018-09-13 NOTE — Anesthesia Procedure Notes (Signed)
Procedure Name: LMA Insertion Performed by: Demetrius Charity, CRNA Pre-anesthesia Checklist: Patient identified, Patient being monitored, Timeout performed, Emergency Drugs available and Suction available Patient Re-evaluated:Patient Re-evaluated prior to induction Oxygen Delivery Method: Circle system utilized Preoxygenation: Pre-oxygenation with 100% oxygen Induction Type: IV induction Ventilation: Mask ventilation without difficulty LMA: LMA inserted LMA Size: 4.0 Tube type: Oral Number of attempts: 1 Placement Confirmation: positive ETCO2 and breath sounds checked- equal and bilateral Tube secured with: Tape Dental Injury: Teeth and Oropharynx as per pre-operative assessment

## 2018-09-13 NOTE — Op Note (Signed)
[facility]  Percutaneous Study/Intervention Procedural Note   Date of Surgery: 09/13/2018  Surgeon(s):Aaditya Letizia    Assistants:none  Pre-operative Diagnosis: PAD with ulceration right lower extremity  Post-operative diagnosis:  Same  Procedure(s) Performed:             1.  Ultrasound guidance for vascular access left femoral artery             2.  Catheter placement into right common femoral artery from left femoral approach             3.  Aortogram and selective right lower extremity angiogram             4.  StarClose closure device left femoral artery  EBL: 5 cc  Contrast: 40 cc (for both procedures)  Fluoro Time: 4.7 minutes (for both procedures)  Anesthesia: General              Indications:  Patient is a 68 y.o.male with poorly healing ulceration on the right leg and a long history of severe peripheral vascular disease requiring multiple previous interventions. The patient is brought in for angiography for further evaluation and potential treatment.  Due to the limb threatening nature of the situation, angiogram was performed for attempted limb salvage. The patient is aware that if the procedure fails, amputation would be expected.  The patient also understands that even with successful revascularization, amputation may still be required due to the severity of the situation.  Risks and benefits are discussed and informed consent is obtained.   Procedure:  The patient was identified and appropriate procedural time out was performed.  The patient was then placed supine on the table and prepped and draped in the usual sterile fashion. Moderate conscious sedation was administered during a face to face encounter with the patient throughout the procedure with my supervision of the RN administering medicines and monitoring the patient's vital signs, pulse oximetry, telemetry and mental status throughout from the start of the procedure until the patient was taken  to the recovery room. Ultrasound was used to evaluate the left common femoral artery.  It was patent .  A digital ultrasound image was acquired.  A Seldinger needle was used to access the left common femoral artery under direct ultrasound guidance and a permanent image was performed.  A 0.035 J wire was advanced without resistance and a 5Fr sheath was placed.  Pigtail catheter was placed into the aorta and an AP aortogram was performed. This demonstrated very sluggish flow in the renal arteries consistent with longstanding end-stage renal disease.  The circulation time was somewhat slow but the aorta and iliac arteries had no significant stenosis. I then crossed the aortic bifurcation and advanced to the right femoral head in the right common femoral artery. Selective right lower extremity angiogram was then performed. This demonstrated no hemodynamically significant stenosis within the right common femoral artery, SFA, or popliteal artery.  There was then two-vessel runoff with the anterior tibial artery and peroneal arteries with a long chronic occlusion of the posterior tibial artery without distal reconstitution seen.  His perfusion was adequate for wound healing and no revascularization was needed.  I elected to terminate the procedure. The sheath was removed and StarClose closure device was deployed in the left femoral artery with excellent hemostatic result.  The catheter removal and venogram portion of the procedure will be dictated separately.  Findings:  Aortogram:  Very sluggish flow in the renal arteries consistent with longstanding end-stage renal disease.  The circulation time was somewhat slow but the aorta and iliac arteries had no significant stenosis.             Right lower Extremity:  This demonstrated no hemodynamically significant stenosis within the right common femoral artery, SFA, or popliteal artery.  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  His perfusion was adequate for wound healing and no revascularization was needed.   Disposition: Patient was taken to the recovery room in stable condition having tolerated the procedure well.  Complications: None  Leotis Pain 09/13/2018 11:50 AM   This note was created with Dragon Medical transcription system. Any errors in dictation are purely unintentional.

## 2018-09-13 NOTE — Anesthesia Postprocedure Evaluation (Signed)
Anesthesia Post Note  Patient: Johnny [name].  Procedure(s) Performed: LOWER EXTREMITY ANGIOGRAPHY (Right Leg Lower) LOWER EXTREMITY VENOGRAPHY (Right ) DIALYSIS/PERMA CATHETER REMOVAL (N/A )  Patient location during evaluation: PACU Anesthesia Type: General Level of consciousness: awake and alert and oriented Pain management: pain level controlled Vital Signs Assessment: post-procedure vital signs reviewed and stable Respiratory status: spontaneous breathing, nonlabored ventilation and respiratory function stable Cardiovascular status: blood pressure returned to baseline and stable Postop Assessment: no signs of nausea or vomiting Anesthetic complications: no     Last Vitals:  Vitals:   09/13/18 1345 09/13/18 1356  BP: (!) 168/79 (!) 166/76  Pulse: 81 83  Resp: 17 20  Temp:    SpO2: 100% 99%    Last Pain:  Vitals:   09/13/18 1356  TempSrc:   PainSc: 0-No pain                 Britnay Magnussen

## 2018-09-13 NOTE — Op Note (Signed)
Operative Note     Preoperative diagnosis:   1. ESRD with functional permanent access, central venous stenosis around the catheter  Postoperative diagnosis:  1. ESRD with functional permanent access  Procedure:   Removal of right jugular Permcath Right jugular venogram and superior venacavogram Percutaneous transluminal angioplasty of the right jugular vein, innominate vein, and superior vena cava with a 12 mm diameter by 8 cm length angioplasty balloon  Surgeon:  Leotis Pain, MD  Anesthesia: General  EBL:  Minimal  Indication for the Procedure:  The patient has a functional permanent dialysis access and no longer needs their permcath.  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  Risks and benefits are discussed and informed consent is obtained.  Description of the Procedure:  The patient's right neck, chest and existing catheter were sterilely prepped and draped. The area around the catheter was anesthetized copiously with 1% lidocaine.  An advantage wire was then placed through the catheter to keep the venous access and parked in the inferior vena cava.  The catheter was dissected out with curved hemostats until the cuff was freed from the surrounding fibrous sheath. The fiber sheath was transected, and the catheter was then removed in its entirety using gentle traction.  Over the wire, a catheter was taken into the inferior vena cava and a sheath was placed in the right jugular vein.  A right jugular venogram and superior venacavogram were then performed.  This demonstrated high-grade narrowing in the 80% range in the right jugular and innominate vein entering into the superior vena cava.  Over the advantage wire, a 12 mm diameter by 8 cm length angioplasty balloon was inflated to 16 atm for 1 minute encompassing the initial portion of the  superior vena cava, the right innominate vein, and the right jugular vein up to and just above the clavicle.  This was held for 1 minute.  Completion imaging showed the jugular innominate stenosis now to be less than 20%.  The superior vena cava was widely patent with no significant residual stenosis.  The flow was brisk and markedly improved.  The sheath and wire were then removed.  Pressure was held and sterile dressings were placed. The patient tolerated the procedure well and was taken to the recovery room in stable condition.     Leotis Pain  09/13/2018, 12:00 PM This note was created with Dragon Medical transcription system. Any errors in dictation are purely unintentional.

## 2018-09-13 NOTE — Discharge Instructions (Signed)

## 2018-09-17 ENCOUNTER — Encounter: Payer: Self-pay | Admitting: Vascular Surgery

## 2018-10-02 ENCOUNTER — Telehealth (INDEPENDENT_AMBULATORY_CARE_PROVIDER_SITE_OTHER): Payer: Self-pay

## 2018-10-02 NOTE — Telephone Encounter (Signed)
Annette from [facility] left a message informing that the patient is having increase drainage with yellow and gray color and strong odor. I inform the nurse that the patient should be going to the wound center but the patient had stated to the nurse he was waiting on our office to make an appointment. I spoke with Arna Medici NP and she advise for the patient to contact the wound center to set up appointment and also call in Keflex 500mg  BID for 10 days.

## 2018-10-09 ENCOUNTER — Encounter (INDEPENDENT_AMBULATORY_CARE_PROVIDER_SITE_OTHER): Payer: Self-pay | Admitting: Nurse Practitioner

## 2018-10-09 ENCOUNTER — Ambulatory Visit (INDEPENDENT_AMBULATORY_CARE_PROVIDER_SITE_OTHER): Payer: Medicare Other | Admitting: Nurse Practitioner

## 2018-10-09 VITALS — BP 207/100 | HR 108 | Resp 18 | Ht 68.0 in | Wt 152.0 lb

## 2018-10-09 DIAGNOSIS — Z87891 Personal history of nicotine dependence: Secondary | ICD-10-CM

## 2018-10-09 DIAGNOSIS — I120 Hypertensive chronic kidney disease with stage 5 chronic kidney disease or end stage renal disease: Secondary | ICD-10-CM | POA: Diagnosis not present

## 2018-10-09 DIAGNOSIS — E1169 Type 2 diabetes mellitus with other specified complication: Secondary | ICD-10-CM

## 2018-10-09 DIAGNOSIS — E1122 Type 2 diabetes mellitus with diabetic chronic kidney disease: Secondary | ICD-10-CM | POA: Diagnosis not present

## 2018-10-09 DIAGNOSIS — L97211 Non-pressure chronic ulcer of right calf limited to breakdown of skin: Secondary | ICD-10-CM

## 2018-10-09 DIAGNOSIS — N186 End stage renal disease: Secondary | ICD-10-CM | POA: Diagnosis not present

## 2018-10-09 DIAGNOSIS — Z79899 Other long term (current) drug therapy: Secondary | ICD-10-CM

## 2018-10-09 DIAGNOSIS — Z992 Dependence on renal dialysis: Secondary | ICD-10-CM

## 2018-10-09 DIAGNOSIS — I10 Essential (primary) hypertension: Secondary | ICD-10-CM

## 2018-10-09 NOTE — Progress Notes (Signed)
SUBJECTIVE:  Patient ID: Johnny Bucco., male    DOB: 12-06-1950, 68 y.o.   MRN: [id number] Chief Complaint  Patient presents with   Follow-up    right leg wound check    HPI  Johnny Antrim. is a 68 y.o. male that presents today for evaluation of his right lower extremity wound.  The patient initially had an ulceration which required skin grafting.  The wound was healing extremely well until there was a moment of hypotension during a procedure and the graft subsequently failed.  Since his last visit, the wound appears to be healing however it still has a long way to go.  The patient has an upcoming appointment with the wound care center for further treatment of his right lower extremity wound  The patient also recently underwent a right upper extremity venogram in order to repair some central venous stenosis.  Since that time he has been able to utilize his right brachial axillary graft, however he is now reporting hand numbness and discomfort.  He denies any fever, chills, nausea, vomiting or diarrhea.  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   Peripheral vascular disease [facility])     Past Surgical History:  Procedure Laterality Date   A/V FISTULAGRAM Right 09/06/2018   Procedure: A/V FISTULAGRAM;  Surgeon: Algernon Huxley, MD;  Location: [place] [facility];  Service: Cardiovascular;  Laterality: Right;   A/V SHUNTOGRAM Left 06/21/2017   Procedure: A/V SHUNTOGRAM;  Surgeon: Katha Cabal, MD;  Location: [place] [facility];  Service: Cardiovascular;  Laterality: Left;   APPLICATION OF WOUND VAC Right 04/11/2018   Procedure: APPLICATION OF WOUND VAC;  Surgeon: Algernon Huxley, MD;   Location: [facility];  Service: Vascular;  Laterality: Right;   AV FISTULA PLACEMENT Left 09/18/2015   Procedure: INSERTION OF ARTERIOVENOUS (AV) GORE-TEX GRAFT ARM ( BRACH/AXILLARY GRAFT W/ INSTANT STICK GRAFT );  Surgeon: Katha Cabal, MD;  Location: [facility];  Service: Vascular;  Laterality: Left;   AV FISTULA PLACEMENT Right 07/19/2018   Procedure: INSERTION OF ARTERIOVENOUS (AV) GORE-TEX GRAFT ARM ( BRACHIAL AXILLARY);  Surgeon: Algernon Huxley, MD;  Location: [facility];  Service: Vascular;  Laterality: Right;   DIALYSIS FISTULA CREATION Right 10/2017   right chest perm cath   DIALYSIS/PERMA CATHETER REMOVAL N/A 09/13/2018   Procedure: DIALYSIS/PERMA CATHETER REMOVAL;  Surgeon: Algernon Huxley, MD;  Location: [place] [facility];  Service: Cardiovascular;  Laterality: N/A;   ESOPHAGOGASTRODUODENOSCOPY N/A 12/19/2017   Procedure: ESOPHAGOGASTRODUODENOSCOPY (EGD);  Surgeon: Lin Landsman, MD;  Location: [facility] ENDOSCOPY;  Service: Gastroenterology;  Laterality: N/A;   LOWER EXTREMITY ANGIOGRAPHY Left 11/16/2017   Procedure: LOWER EXTREMITY ANGIOGRAPHY;  Surgeon: Algernon Huxley, MD;  Location: [place] [facility];  Service: Cardiovascular;  Laterality: Left;   LOWER EXTREMITY ANGIOGRAPHY Right 01/18/2018   Procedure: LOWER EXTREMITY ANGIOGRAPHY;  Surgeon: Algernon Huxley, MD;  Location: [place] [facility];  Service: Cardiovascular;  Laterality: Right;   LOWER EXTREMITY ANGIOGRAPHY Left 04/02/2018   Procedure: LOWER EXTREMITY ANGIOGRAPHY;  Surgeon: Algernon Huxley, MD;  Location: [place] [facility];  Service:  Cardiovascular;  Laterality: Left;   LOWER EXTREMITY ANGIOGRAPHY Right 04/09/2018   Procedure: Lower Extremity Angiography with possible intervention;  Surgeon: Algernon Huxley, MD;  Location: [place] [facility];  Service: Cardiovascular;  Laterality: Right;   LOWER EXTREMITY ANGIOGRAPHY Right 07/23/2018   Procedure: Lower Extremity Angiography;  Surgeon: Algernon Huxley, MD;  Location: [place] [facility];  Service: Cardiovascular;  Laterality: Right;   LOWER EXTREMITY ANGIOGRAPHY Right 09/13/2018   Procedure: LOWER EXTREMITY ANGIOGRAPHY;  Surgeon: Algernon Huxley, MD;  Location: [place] [facility];  Service: Cardiovascular;  Laterality: Right;   LOWER EXTREMITY VENOGRAPHY Right 09/13/2018   Procedure: LOWER EXTREMITY VENOGRAPHY;  Surgeon: Algernon Huxley, MD;  Location: [place] [facility];  Service: Cardiovascular;  Laterality: Right;   PERIPHERAL VASCULAR CATHETERIZATION Left 09/01/2015   Procedure: A/V Shuntogram/Fistulagram;  Surgeon: Katha Cabal, MD;  Location: [place] [facility];  Service: Cardiovascular;  Laterality: Left;   PERIPHERAL VASCULAR CATHETERIZATION N/A 09/30/2015   Procedure: A/V Shuntogram/Fistulagram with perm cathether removal;  Surgeon: Algernon Huxley, MD;  Location: [place] [facility];  Service: Cardiovascular;  Laterality: N/A;   PERIPHERAL VASCULAR CATHETERIZATION Left 09/30/2015   Procedure: A/V Shunt Intervention;  Surgeon: Algernon Huxley, MD;  Location: [place] [facility];  Service: Cardiovascular;  Laterality: Left;   PERIPHERAL VASCULAR CATHETERIZATION Left 12/03/2015   Procedure: Thrombectomy;  Surgeon: Algernon Huxley, MD;  Location: [place] [facility];  Service: Cardiovascular;  Laterality: Left;   PERIPHERAL VASCULAR CATHETERIZATION Left 01/28/2016   Procedure: Thrombectomy;  Surgeon: Algernon Huxley, MD;  Location: [place] [facility];  Service: Cardiovascular;  Laterality: Left;   PERIPHERAL VASCULAR CATHETERIZATION N/A 01/28/2016   Procedure: A/V Shuntogram/Fistulagram;  Surgeon: Algernon Huxley, MD;  Location: [place] [facility];  Service: Cardiovascular;  Laterality: N/A;   SKIN SPLIT GRAFT Right 05/24/2018   Procedure: SKIN GRAFT SPLIT THICKNESS ( RIGHT CALF);  Surgeon: Algernon Huxley, MD;  Location: [facility];  Service: Vascular;  Laterality: Right;   WOUND DEBRIDEMENT Right 04/11/2018   Procedure: DEBRIDEMENT WOUND calf muscle and skin;  Surgeon:  Algernon Huxley, MD;  Location: [facility];  Service: Vascular;  Laterality: Right;    Social History   Socioeconomic History   Marital status: Single    Spouse name: Not on file   Number of children: Not on file   Years of education: Not on file   Highest education level: Not on file  Occupational History   Occupation: retired    Comment: Medical laboratory scientific officer  Social Needs   Financial resource strain: Patient refused   Food insecurity:    Worry: Patient refused    Inability: Patient refused   Transportation needs:    Medical: Patient refused    Non-medical: Patient refused  Tobacco Use   Smoking status: Former Smoker    Types: Cigarettes    Last attempt to quit: 05/17/2005    Years since quitting: 13.4   Smokeless tobacco: Never Used  Substance and Sexual Activity   Alcohol use: No   Drug use: No   Sexual activity: Not Currently  Lifestyle   Physical activity:    Days per week: Patient refused    Minutes per session: Patient refused   Stress: Not on file  Relationships   Social connections:    Talks on phone: Patient refused    Gets together: Patient refused    Attends religious service: Patient refused    Active  member of club or organization: Patient refused    Attends meetings of clubs or organizations: Patient refused    Relationship status: Patient refused   Intimate partner violence:    Fear of current or ex partner: Patient refused    Emotionally abused: Patient refused    Physically abused: Patient refused    Forced sexual activity: Patient refused  Other Topics Concern   Not on file  Social History Narrative   Not on file    Family History  Problem Relation Age of Onset   Hypertension Other    Heart disease Other    Diabetes Mother     Allergies  Allergen Reactions   Shellfish Allergy Anaphylaxis     Review of Systems   Review of Systems: Negative Unless Checked Constitutional: [] Weight loss  [] Fever   [] Chills Cardiac: [] Chest pain   []  Atrial Fibrillation  [] Palpitations   [] Shortness of breath when laying flat   [] Shortness of breath with exertion. [] Shortness of breath at rest Vascular:  [] Pain in legs with walking   [] Pain in legs with standing [] Pain in legs when laying flat   [] Claudication    [] Pain in feet when laying flat    [] History of DVT   [] Phlebitis   [x] Swelling in legs   [] Varicose veins   [] Non-healing ulcers Pulmonary:   [] Uses home oxygen   [] Productive cough   [] Hemoptysis   [] Wheeze  [] COPD   [] Asthma Neurologic:  [] Dizziness   [] Seizures  [] Blackouts [] History of stroke   [] History of TIA  [] Aphasia   [] Temporary Blindness   [] Weakness or numbness in arm   [] Weakness or numbness in leg Musculoskeletal:   [] Joint swelling   [] Joint pain   [] Low back pain  []  History of Knee Replacement [] Arthritis [] back Surgeries  []  Spinal Stenosis    Hematologic:  [] Easy bruising  [] Easy bleeding   [] Hypercoagulable state   [x] Anemic Gastrointestinal:  [] Diarrhea   [] Vomiting  [] Gastroesophageal reflux/heartburn   [] Difficulty swallowing. [] Abdominal pain Genitourinary:  [x] Chronic kidney disease   [] Difficult urination  [] Anuric   [] Blood in urine [] Frequent urination  [] Burning with urination   [] Hematuria Skin:  [] Rashes   [] Ulcers [x] Wounds Psychological:  [] History of anxiety   []  History of major depression  []  Memory Difficulties      OBJECTIVE:   Physical Exam  BP (!) 207/100    Pulse (!) 108    Resp 18    Ht 5\' 8"  (1.727 m)    Wt 152 lb (68.9 kg)    BMI 23.11 kg/m   Gen: WD/WN, NAD Head: [place]/AT, No temporalis wasting.  Ear/Nose/Throat: Hearing grossly intact, nares w/o erythema or drainage Eyes: PER, EOMI, sclera nonicteric.  Neck: Supple, no masses.  No JVD.  Pulmonary:  Good air movement, no use of accessory muscles.  Cardiac: RRR Vascular:  Good thrill and bruit Vessel Right Left  Radial Palpable Palpable   Gastrointestinal: soft, non-distended. No guarding/no  peritoneal signs.  Musculoskeletal: M/S 5/5 throughout.  No deformity or atrophy.  Neurologic: Pain and light touch intact in extremities.  Symmetrical.  Speech is fluent. Motor exam as listed above. Psychiatric: Judgment intact, Mood & affect appropriate for pt's clinical situation. Dermatologic: Very large wound encompassing nearly the entire calf of the right lower extremity.  Various stages of healing. Lymph : No Cervical lymphadenopathy, no lichenification or skin changes of chronic lymphedema.       ASSESSMENT AND PLAN:  1. ESRD on dialysis [facility]) We will have the  patient return for noninvasive studies to evaluate whether or not he has steal syndrome.  Based upon the noninvasive study results, we will discuss possible options for treatment. - VAS US DUPLEX DIALYSIS ACCESS (AVF, AVG); Future  2. Lower limb ulcer, calf, right, limited to breakdown of skin [facility]) The patient has an upcoming appointment with the wound center.  Due to the extensiveness of the wound and different wound healing modalities that are needed we will defer treatment of the wound to the wound care center.  Recent angiogram reveals that patient has adequate blood flow in order to heal wound.  3. Type 2 diabetes mellitus with other specified complication, unspecified whether long term insulin use ([place]) Continue hypoglycemic medications as already ordered, these medications have been reviewed and there are no changes at this time.  Hgb A1C to be monitored as already arranged by primary service   4. Hypertension, unspecified type Continue antihypertensive medications as already ordered, these medications have been reviewed and there are no changes at this time.  Patient blood pressure is elevated today, however patient reports not taking recent dose.  Advised to take most recent dose and to continue to monitor blood pressure.  If it continues to be over 676 degrees systolic he should report for evaluation at the  emergency room or urgent care.    Current Outpatient Medications on File Prior to Visit  Medication Sig Dispense Refill   allopurinol (ZYLOPRIM) 100 MG tablet Take 100 mg by mouth daily.     apixaban (ELIQUIS) 2.5 MG TABS tablet Take 1 tablet (2.5 mg total) by mouth 2 (two) times daily. 60 tablet 0   atorvastatin (LIPITOR) 10 MG tablet Take 1 tablet by mouth daily.     calcitRIOL (ROCALTROL) 0.5 MCG capsule Take 0.5 mcg by mouth daily.     calcium acetate (PHOSLO) 667 MG capsule Take 1,334 mg by mouth 3 (three) times daily with meals.     clonazePAM (KLONOPIN) 0.5 MG tablet Take 1 tablet (0.5 mg total) by mouth as needed. 10 tablet 0   clopidogrel (PLAVIX) 75 MG tablet Take 1 tablet (75 mg total) by mouth daily. 30 tablet 11   folic acid-vitamin b complex-vitamin c-selenium-zinc (DIALYVITE) 3 MG TABS tablet Take 1 tablet by mouth daily.      gabapentin (NEURONTIN) 100 MG capsule Take 1 capsule (100 mg total) by mouth at bedtime.     HYDROcodone-acetaminophen (NORCO) 5-325 MG tablet Take 1 tablet by mouth every 6 (six) hours as needed for moderate pain. 25 tablet 0   metoprolol succinate (TOPROL-XL) 25 MG 24 hr tablet Take 25 mg by mouth at bedtime.     cephALEXin (KEFLEX) 500 MG capsule      ciprofloxacin (CIPRO) 500 MG tablet      sevelamer carbonate (RENVELA) 800 MG tablet      No current facility-administered medications on file prior to visit.     There are no Patient Instructions on file for this visit. No follow-ups on file.   Kris Hartmann, NP  This note was completed with Sales executive.  Any errors are purely unintentional.

## 2018-10-11 ENCOUNTER — Other Ambulatory Visit: Payer: Self-pay

## 2018-10-11 ENCOUNTER — Other Ambulatory Visit: Payer: Self-pay | Admitting: Physician Assistant

## 2018-10-11 ENCOUNTER — Ambulatory Visit
Admission: RE | Admit: 2018-10-11 | Discharge: 2018-10-11 | Disposition: A | Discharge status: Home / Self Care | Payer: Medicare Other | Source: Ambulatory Visit | Attending: Physician Assistant | Admitting: Physician Assistant

## 2018-10-11 ENCOUNTER — Encounter: Payer: Medicare Other | Attending: Physician Assistant | Admitting: Physician Assistant

## 2018-10-11 DIAGNOSIS — E11622 Type 2 diabetes mellitus with other skin ulcer: Secondary | ICD-10-CM | POA: Insufficient documentation

## 2018-10-11 DIAGNOSIS — B999 Unspecified infectious disease: Secondary | ICD-10-CM

## 2018-10-11 DIAGNOSIS — E11621 Type 2 diabetes mellitus with foot ulcer: Secondary | ICD-10-CM | POA: Diagnosis not present

## 2018-10-11 DIAGNOSIS — I509 Heart failure, unspecified: Secondary | ICD-10-CM | POA: Diagnosis not present

## 2018-10-11 DIAGNOSIS — L97413 Non-pressure chronic ulcer of right heel and midfoot with necrosis of muscle: Secondary | ICD-10-CM | POA: Diagnosis not present

## 2018-10-11 DIAGNOSIS — E1140 Type 2 diabetes mellitus with diabetic neuropathy, unspecified: Secondary | ICD-10-CM | POA: Insufficient documentation

## 2018-10-11 DIAGNOSIS — N186 End stage renal disease: Secondary | ICD-10-CM | POA: Insufficient documentation

## 2018-10-11 DIAGNOSIS — Z86718 Personal history of other venous thrombosis and embolism: Secondary | ICD-10-CM | POA: Insufficient documentation

## 2018-10-11 DIAGNOSIS — Z87891 Personal history of nicotine dependence: Secondary | ICD-10-CM | POA: Diagnosis not present

## 2018-10-11 DIAGNOSIS — M109 Gout, unspecified: Secondary | ICD-10-CM | POA: Diagnosis not present

## 2018-10-11 DIAGNOSIS — E1151 Type 2 diabetes mellitus with diabetic peripheral angiopathy without gangrene: Secondary | ICD-10-CM | POA: Diagnosis not present

## 2018-10-11 DIAGNOSIS — L97812 Non-pressure chronic ulcer of other part of right lower leg with fat layer exposed: Secondary | ICD-10-CM | POA: Diagnosis present

## 2018-10-11 DIAGNOSIS — Z7901 Long term (current) use of anticoagulants: Secondary | ICD-10-CM | POA: Insufficient documentation

## 2018-10-11 DIAGNOSIS — E1122 Type 2 diabetes mellitus with diabetic chronic kidney disease: Secondary | ICD-10-CM | POA: Diagnosis not present

## 2018-10-11 DIAGNOSIS — Z992 Dependence on renal dialysis: Secondary | ICD-10-CM | POA: Diagnosis not present

## 2018-10-11 DIAGNOSIS — L97813 Non-pressure chronic ulcer of other part of right lower leg with necrosis of muscle: Secondary | ICD-10-CM | POA: Diagnosis not present

## 2018-10-11 DIAGNOSIS — I132 Hypertensive heart and chronic kidney disease with heart failure and with stage 5 chronic kidney disease, or end stage renal disease: Secondary | ICD-10-CM | POA: Insufficient documentation

## 2018-10-11 NOTE — Progress Notes (Signed)
Johnny [name], Johnny [name] ([id number]) Visit Report for 10/11/2018 Abuse/Suicide Risk Screen Details Patient Name: Johnny [name], Johnny [name] Date of Service: 10/11/2018 9:45 AM Medical Record Number: [id number] Patient Account Number: [id number] Date of Birth/Sex: [birth year] (67 y.o. M) Treating RN: Montey Hora Primary Care Garry Nicolini: Kingsley Spittle Other Clinician: Referring Ondria Oswald: Kingsley Spittle Treating Oneal Biglow/Extender: Melburn Hake, HOYT Weeks in Treatment: 0 Abuse/Suicide Risk Screen Items Answer ABUSE RISK SCREEN: Has anyone close to you tried to hurt or harm you recentlyo No Do you feel uncomfortable with anyone in your familyo No Has anyone forced you do things that you didnot want to doo No Electronic Signature(s) Signed: 10/11/2018 4:45:05 PM By: Montey Hora Entered By: Montey Hora on 10/11/2018 11:07:18 Johnny [name] ([id number]) -------------------------------------------------------------------------------- Activities of Daily Living Details Patient Name: Johnny [name] Date of Service: 10/11/2018 9:45 AM Medical Record Number: [id number] Patient Account Number: [id number] Date of Birth/Sex: Aug 02, 1950 (67 y.o. M) Treating RN: Montey Hora Primary Care Marieme Mcmackin: Kingsley Spittle Other Clinician: Referring Jewell Haught: Kingsley Spittle Treating Ivy Meriwether/Extender: Melburn Hake, HOYT Weeks in Treatment: 0 Activities of Daily Living Items Answer Activities of Daily Living (Please select one for each item) Drive Automobile Completely Able Take Medications Completely Able Use Telephone Completely Able Care for Appearance Completely Able Use Toilet Completely Able Bath / Shower Completely Able Dress Self Completely Able Feed Self Completely Able Walk Completely Able Get In / Out Bed Completely Able Housework Completely Able Prepare Meals Completely [place] for Self Completely Able Electronic Signature(s) Signed: 10/11/2018 11:39:51 AM By: Montey Hora Entered By: Montey Hora on 10/11/2018 11:39:50 Johnny [name] ([id number]) -------------------------------------------------------------------------------- Education Screening Details Patient Name: Johnny [name] Date of Service: 10/11/2018 9:45 AM Medical Record Number: [id number] Patient Account Number: [id number] Date of Birth/Sex: [birth year] (67 y.o. M) Treating RN: Montey Hora Primary Care [place] Surges: Kingsley Spittle Other Clinician: Referring Amanee Iacovelli: Kingsley Spittle Treating Pawan Knechtel/Extender: Melburn Hake, HOYT Weeks in Treatment: 0 Primary Learner Assessed: Patient Learning Preferences/Education Level/Primary Language Learning Preference: Explanation, Demonstration Highest Education Level: High School Preferred Language: English Cognitive Barrier Language Barrier: No Translator Needed: No Memory Deficit: No Emotional Barrier: No Cultural/Religious Beliefs Affecting Medical Care: No Physical Barrier Impaired Vision: No Impaired Hearing: No Decreased Hand dexterity: No Knowledge/Comprehension Knowledge Level: Medium Comprehension Level: Medium Ability to understand written Medium instructions: Ability to understand verbal Medium instructions: Motivation Anxiety Level: Calm Cooperation: Cooperative Education Importance: Acknowledges Need Interest in Health Problems: Asks Questions Perception: Coherent Willingness to Engage in Self- Medium Management Activities: Readiness to Engage in Self- Medium Management Activities: Electronic Signature(s) Signed: 10/11/2018 11:40:25 AM By: Montey Hora Entered By: Montey Hora on 10/11/2018 11:40:25 Johnny [name] ([id number]) -------------------------------------------------------------------------------- Fall Risk Assessment Details Patient Name: Johnny [name] Date of Service: 10/11/2018 9:45 AM Medical Record Number: [id number] Patient Account Number: [id number] Date of Birth/Sex: 11/12/1950 (67 y.o.  M) Treating RN: Montey Hora Primary Care Shakiara Lukic: Kingsley Spittle Other Clinician: Referring Caleel Kiner: Kingsley Spittle Treating Evy Lutterman/Extender: Melburn Hake, HOYT Weeks in Treatment: 0 Fall Risk Assessment Items Have you had 2 or more falls in the last 12 monthso 0 No Have you had any fall that resulted in injury in the last 12 monthso 0 No FALLS RISK SCREEN History of falling - immediate or within 3 months 0 No Secondary diagnosis (Do you have 2 or more medical diagnoseso) 0 No Ambulatory aid None/bed rest/wheelchair/nurse 0 No Crutches/cane/walker 15 Yes Furniture 0 No Intravenous therapy Access/Saline/Heparin Lock 0 No Gait/Transferring Normal/ bed rest/ wheelchair 0 No Weak (short steps with or without shuffle, stooped but  able to lift head while 10 Yes walking, may seek support from furniture) Impaired (short steps with shuffle, may have difficulty arising from chair, head 0 No down, impaired balance) Mental Status Oriented to own ability 0 Yes Electronic Signature(s) Signed: 10/11/2018 11:40:42 AM By: Montey Hora Entered By: Montey Hora on 10/11/2018 11:40:42 Johnny [name] ([id number]) -------------------------------------------------------------------------------- Foot Assessment Details Patient Name: Johnny [name] Date of Service: 10/11/2018 9:45 AM Medical Record Number: [id number] Patient Account Number: [id number] Date of Birth/Sex: May 22, 1950 (67 y.o. M) Treating RN: Montey Hora Primary Care Katreena Schupp: Kingsley Spittle Other Clinician: Referring Stephfon Bovey: Kingsley Spittle Treating Burnham Trost/Extender: Melburn Hake, HOYT Weeks in Treatment: 0 Foot Assessment Items Site Locations + = Sensation present, - = Sensation absent, C = Callus, U = Ulcer R = Redness, W = Warmth, M = Maceration, PU = Pre-ulcerative lesion F = Fissure, S = Swelling, D = Dryness Assessment Right: Left: Other Deformity: No No Prior Foot Ulcer: No No Prior Amputation: No No Charcot  Joint: No No Ambulatory Status: Ambulatory With Help Assistance Device: Wheelchair Gait: Buyer, retail Signature(s) Signed: 10/11/2018 11:41:09 AM By: Montey Hora Entered By: Montey Hora on 10/11/2018 11:41:09 Johnny [name] ([id number]) -------------------------------------------------------------------------------- Nutrition Risk Screening Details Patient Name: Johnny [name] Date of Service: 10/11/2018 9:45 AM Medical Record Number: [id number] Patient Account Number: [id number] Date of Birth/Sex: [birth year] (67 y.o. M) Treating RN: Montey Hora Primary Care Stefana Lodico: Kingsley Spittle Other Clinician: Referring Shannel Zahm: Kingsley Spittle Treating Christyann Manolis/Extender: Melburn Hake, HOYT Weeks in Treatment: 0 Height (in): 68 Weight (lbs): 154 Body Mass Index (BMI): 23.4 Nutrition Risk Screening Items Score Screening NUTRITION RISK SCREEN: I have an illness or condition that made me change the kind and/or amount of 0 No food I eat I eat fewer than two meals per day 0 No I eat few fruits and vegetables, or milk products 0 No I have three or more drinks of beer, liquor or wine almost every day 0 No I have tooth or mouth problems that make it hard for me to eat 0 No I don't always have enough money to buy the food I need 0 No I eat alone most of the time 0 No I take three or more different prescribed or over-the-counter drugs a day 1 Yes Without wanting to, I have lost or gained 10 pounds in the last six months 0 No I am not always physically able to shop, cook and/or feed myself 0 No Nutrition Protocols Good Risk Protocol 0 No interventions needed Moderate Risk Protocol High Risk Proctocol Risk Level: Good Risk Score: 1 Electronic Signature(s) Signed: 10/11/2018 11:40:50 AM By: Montey Hora Entered By: Montey Hora on 10/11/2018 11:40:49

## 2018-10-12 NOTE — Progress Notes (Signed)
HERBIE, LEHRMANN ([id number]) Visit Report for 10/11/2018 Allergy List Details Patient Name: Johnny [name], Johnny [name] Date of Service: 10/11/2018 9:45 AM Medical Record Number: [id number] Patient Account Number: [id number] Date of Birth/Sex: 1951-02-11 (67 y.o. M) Treating RN: Montey Hora Primary Care Lorissa Kishbaugh: Kingsley Spittle Other Clinician: Referring Estell Dillinger: Kingsley Spittle Treating Corsica Franson/Extender: Melburn Hake, HOYT Weeks in Treatment: 0 Allergies Active Allergies shellfish derived Allergy Notes Electronic Signature(s) Signed: 10/11/2018 4:45:05 PM By: Montey Hora Entered By: Montey Hora on 10/11/2018 10:27:29 Johnny [name] ([id number]) -------------------------------------------------------------------------------- Arrival Information Details Patient Name: Johnny [name] Date of Service: 10/11/2018 9:45 AM Medical Record Number: [id number] Patient Account Number: [id number] Date of Birth/Sex: [birth year] (67 y.o. M) Treating RN: Montey Hora Primary Care Gionna Polak: Kingsley Spittle Other Clinician: Referring Delsy Etzkorn: Kingsley Spittle Treating Blong Busk/Extender: Melburn Hake, HOYT Weeks in Treatment: 0 Visit Information Patient Arrived: Wheel Chair Arrival Time: 10:05 Accompanied By: Self Transfer Assistance: None Patient Identification Verified: Yes Secondary Verification Process Completed: Yes Patient Requires Transmission-Based No Precautions: Patient Has Alerts: Yes Patient Alerts: DM11 History Since Last Visit Any new allergies or adverse reactions: No Had a fall or experienced change in activities of daily living that may affect risk of falls: No Signs or symptoms of abuse/neglect since last visito No Hospitalized since last visit: Yes Implantable device outside of the clinic excluding cellular tissue based products placed in the center since last visit: No Has Dressing in Place as Prescribed: Yes Electronic Signature(s) Signed: 10/11/2018 4:45:05 PM By: Montey Hora Entered By: Montey Hora on 10/11/2018 11:00:20 Johnny [name] ([id number]) -------------------------------------------------------------------------------- Clinic Level of Care Assessment Details Patient Name: Johnny [name] Date of Service: 10/11/2018 9:45 AM Medical Record Number: [id number] Patient Account Number: [id number] Date of Birth/Sex: 11/10/1950 (67 y.o. M) Treating RN: Cornell Barman Primary Care Donzel Romack: Kingsley Spittle Other Clinician: Referring Inaki Vantine: Kingsley Spittle Treating Habiba Treloar/Extender: Melburn Hake, HOYT Weeks in Treatment: 0 Clinic Level of Care Assessment Items TOOL 1 Quantity Score []  - Use when EandM and Procedure is performed on INITIAL visit 0 ASSESSMENTS - Nursing Assessment / Reassessment X - General Physical Exam (combine w/ comprehensive assessment (listed just below) when 1 20 performed on new pt. evals) X- 1 25 Comprehensive Assessment (HX, ROS, Risk Assessments, Wounds Hx, etc.) ASSESSMENTS - Wound and Skin Assessment / Reassessment []  - Dermatologic / Skin Assessment (not related to wound area) 0 ASSESSMENTS - Ostomy and/or Continence Assessment and Care []  - Incontinence Assessment and Management 0 []  - 0 Ostomy Care Assessment and Management (repouching, etc.) PROCESS - Coordination of Care []  - Simple Patient / Family Education for ongoing care 0 X- 1 20 Complex (extensive) Patient / Family Education for ongoing care X- 1 10 Staff obtains Programmer, systems, Records, Test Results / Process Orders []  - 0 Staff telephones HHA, Nursing Homes / Clarify orders / etc []  - 0 Routine Transfer to another Facility (non-emergent condition) []  - 0 Routine Hospital Admission (non-emergent condition) X- 1 15 New Admissions / Biomedical engineer / Ordering NPWT, Apligraf, etc. []  - 0 Emergency Hospital Admission (emergent condition) PROCESS - Special Needs []  - Pediatric / Minor Patient Management 0 []  - 0 Isolation Patient Management []  -  0 Hearing / Language / Visual special needs []  - 0 Assessment of Community assistance (transportation, D/C planning, etc.) []  - 0 Additional assistance / Altered mentation []  - 0 Support Surface(s) Assessment (bed, cushion, seat, etc.) Johnny [name], Johnny [name] ([id number]) INTERVENTIONS - Miscellaneous []  - External ear exam 0 []  - 0 Patient Transfer (multiple staff / Civil Service fast streamer / Similar devices) []  -  0 Simple Staple / Suture removal (25 or less) []  - 0 Complex Staple / Suture removal (26 or more) []  - 0 Hypo/Hyperglycemic Management (do not check if billed separately) []  - 0 Ankle / Brachial Index (ABI) - do not check if billed separately Has the patient been seen at the hospital within the last three years: Yes Total Score: 90 Level Of Care: New/Established - Level 3 Electronic Signature(s) Signed: 10/11/2018 5:05:56 PM By: Gretta Cool, BSN, RN, CWS, Kim RN, BSN Entered By: Gretta Cool, BSN, RN, CWS, Kim on 10/11/2018 11:52:48 Johnny [name] ([id number]) -------------------------------------------------------------------------------- Encounter Discharge Information Details Patient Name: Johnny [name] Date of Service: 10/11/2018 9:45 AM Medical Record Number: [id number] Patient Account Number: [id number] Date of Birth/Sex: July 01, 1950 (67 y.o. M) Treating RN: Harold Barban Primary Care Klint Lezcano: Kingsley Spittle Other Clinician: Referring Imanni Burdine: Kingsley Spittle Treating Waymon Laser/Extender: Melburn Hake, HOYT Weeks in Treatment: 0 Encounter Discharge Information Items Discharge Condition: Stable Ambulatory Status: Ambulatory Discharge Destination: Home Transportation: Private Auto Accompanied By: self Schedule Follow-up Appointment: Yes Clinical Summary of Care: Electronic Signature(s) Signed: 10/11/2018 11:49:23 AM By: Harold Barban Previous Signature: 10/11/2018 10:34:07 AM Version By: Harold Barban Entered By: Harold Barban on 10/11/2018 11:49:23 Johnny [name]  ([id number]) -------------------------------------------------------------------------------- Lower Extremity Assessment Details Patient Name: Johnny [name] Date of Service: 10/11/2018 9:45 AM Medical Record Number: [id number] Patient Account Number: [id number] Date of Birth/Sex: August 30, 1950 (67 y.o. M) Treating RN: Montey Hora Primary Care Lexiana Spindel: Kingsley Spittle Other Clinician: Referring Krishon Adkison: Kingsley Spittle Treating Archimedes Harold/Extender: Melburn Hake, HOYT Weeks in Treatment: 0 Edema Assessment Assessed: [Left: No] [Right: No] Edema: [Left: N] [Right: o] Calf Left: Right: Point of Measurement: 35 cm From Medial Instep cm 36.5 cm Ankle Left: Right: Point of Measurement: 13 cm From Medial Instep cm 27 cm Vascular Assessment Pulses: Dorsalis Pedis Palpable: [Right:Yes] Posterior Tibial Palpable: [Right:Yes] Notes ABI at [facility] on 08/31/2018 Both [place] with TBI L .88 and R .93 Electronic Signature(s) Signed: 10/11/2018 4:45:05 PM By: Montey Hora Entered By: Montey Hora on 10/11/2018 11:04:56 Johnny [name] ([id number]) -------------------------------------------------------------------------------- Multi Wound Chart Details Patient Name: Johnny [name] Date of Service: 10/11/2018 9:45 AM Medical Record Number: [id number] Patient Account Number: [id number] Date of Birth/Sex: March 01, 1951 (67 y.o. M) Treating RN: Cornell Barman Primary Care Karessa Onorato: Kingsley Spittle Other Clinician: Referring Alto Gandolfo: Kingsley Spittle Treating Jann Milkovich/Extender: Melburn Hake, HOYT Weeks in Treatment: 0 Vital Signs Height(in): 68 Pulse(bpm): 94 Weight(lbs): 154 Blood Pressure(mmHg): 162/48 Body Mass Index(BMI): 23 Temperature(F): 98.2 Respiratory Rate 16 (breaths/min): Photos: Wound Location: Right Lower Leg - Posterior Right Calcaneus - Lateral Right Lower Leg - Anterior Wounding Event: Gradually Appeared Gradually Appeared Gradually Appeared Primary Etiology: Diabetic Wound/Ulcer of the  Diabetic Wound/Ulcer of the Diabetic Wound/Ulcer of the Lower Extremity Lower Extremity Lower Extremity Comorbid History: Cataracts, Congestive Heart Cataracts, Congestive Heart Cataracts, Congestive Heart Failure, Deep Vein Failure, Deep Vein Failure, Deep Vein Thrombosis, Hypertension, Thrombosis, Hypertension, Thrombosis, Hypertension, Peripheral Venous Disease, Peripheral Venous Disease, Peripheral Venous Disease, Type II Diabetes, End Stage Type II Diabetes, End Stage Type II Diabetes, End Stage Renal Disease, History of Renal Disease, History of Renal Disease, History of pressure wounds, Gout, pressure wounds, Gout, pressure wounds, Gout, Neuropathy Neuropathy Neuropathy Date Acquired: 10/02/2017 11/06/2017 11/06/2017 Weeks of Treatment: 0 0 0 Wound Status: Open Open Open Pending Amputation on Yes Yes Yes Presentation: Measurements L x W x D 17x22x0.1 2.8x3.9x0.4 10x7.8x0.1 (cm) Area (cm) : 293.739 8.577 61.261 Volume (cm) : 29.374 3.431 6.126 Johnny [name], Johnny [name] ([id number]) % Reduction in Area: N/A N/A 0.00% % Reduction in Volume:  N/A N/A 0.00% Classification: Grade 2 Grade 1 Grade 1 Exudate Amount: Large Medium Small Exudate Type: Purulent Purulent Purulent Exudate Color: yellow, brown, green yellow, brown, green yellow, brown, green Foul Odor After Cleansing: Yes No No Odor Anticipated Due to No N/A N/A Product Use: Wound Margin: Indistinct, nonvisible Flat and Intact Flat and Intact Granulation Amount: Medium (34-66%) None Present (0%) None Present (0%) Necrotic Amount: Medium (34-66%) Large (67-100%) Large (67-100%) Necrotic Tissue: Eschar, Adherent Slough Eschar, Adherent [place] Exposed Structures: Fat Layer (Subcutaneous Fascia: No Fascia: No Tissue) Exposed: Yes Fat Layer (Subcutaneous Fat Layer (Subcutaneous Tendon: Yes Tissue) Exposed: No Tissue) Exposed: No Fascia: No Tendon: No Tendon: No Muscle: No Muscle: No Muscle: No Joint: No Joint: No Joint:  No Bone: No Bone: No Bone: No Limited to Skin Breakdown Limited to Skin Breakdown Epithelialization: None None None Treatment Notes Wound #3 (Right, Posterior Lower Leg) Notes TCA, Non-adherent dressing, conform, tape to secure Electronic Signature(s) Signed: 10/11/2018 11:48:56 AM By: Gretta Cool, BSN, RN, CWS, Kim RN, BSN Entered By: Gretta Cool, BSN, RN, CWS, Kim on 10/11/2018 11:48:55 Johnny [name] ([id number]) -------------------------------------------------------------------------------- Multi-Disciplinary Care Plan Details Patient Name: Johnny [name] Date of Service: 10/11/2018 9:45 AM Medical Record Number: [id number] Patient Account Number: [id number] Date of Birth/Sex: [birth year] (67 y.o. M) Treating RN: Cornell Barman Primary Care Montford Barg: Kingsley Spittle Other Clinician: Referring Devonna Oboyle: Kingsley Spittle Treating Letroy Vazguez/Extender: Melburn Hake, HOYT Weeks in Treatment: 0 Active Inactive Necrotic Tissue Nursing Diagnoses: Impaired tissue integrity related to necrotic/devitalized tissue Knowledge deficit related to management of necrotic/devitalized tissue Goals: Necrotic/devitalized tissue will be minimized in the wound bed Date Initiated: 10/11/2018 Target Resolution Date: 10/18/2018 Goal Status: Active Patient/caregiver will verbalize understanding of reason and process for debridement of necrotic tissue Date Initiated: 10/11/2018 Target Resolution Date: 10/11/2018 Goal Status: Active Interventions: Assess patient pain level pre-, during and post procedure and prior to discharge Provide education on necrotic tissue and debridement process Treatment Activities: Excisional debridement : 10/11/2018 Notes: Orientation to the Wound Care Program Nursing Diagnoses: Knowledge deficit related to the wound healing center program Goals: Patient/caregiver will verbalize understanding of the [place] Program Date Initiated: 10/11/2018 Target Resolution Date: 10/11/2018 Goal Status:  Active Interventions: Provide education on orientation to the wound center Notes: Soft Tissue Infection Nursing Diagnoses: Impaired tissue integrity Johnny [name], Johnny [name] ([id number]) Knowledge deficit related to disease process and management Knowledge deficit related to home infection control: handwashing, handling of soiled dressings, supply storage Goals: Patient will remain free of wound infection Date Initiated: 10/11/2018 Target Resolution Date: 10/18/2018 Goal Status: Active Patient/caregiver will verbalize understanding of or measures to prevent infection and contamination in the home setting Date Initiated: 10/11/2018 Target Resolution Date: 10/18/2018 Goal Status: Active Signs and symptoms of infection will be recognized early to allow for prompt treatment Date Initiated: 10/11/2018 Target Resolution Date: 10/18/2018 Goal Status: Active Interventions: Assess signs and symptoms of infection every visit Notes: Wound/Skin Impairment Nursing Diagnoses: Impaired tissue integrity Goals: Patient/caregiver will verbalize understanding of skin care regimen Date Initiated: 10/11/2018 Target Resolution Date: 10/11/2018 Goal Status: Active Ulcer/skin breakdown will have a volume reduction of 30% by week 4 Date Initiated: 10/11/2018 Target Resolution Date: 11/08/2018 Goal Status: Active Interventions: Provide education on ulcer and skin care Treatment Activities: Patient referred to home care : 10/11/2018 Topical wound management initiated : 10/11/2018 Notes: Electronic Signature(s) Signed: 10/11/2018 11:48:35 AM By: Gretta Cool, BSN, RN, CWS, Kim RN, BSN Entered By: Gretta Cool, BSN, RN, CWS, Kim on 10/11/2018 11:48:34 Johnny [name] ([id number]) -------------------------------------------------------------------------------- Pain Assessment Details  Patient Name: Johnny [name], Johnny [name] Date of Service: 10/11/2018 9:45 AM Medical Record Number: [id number] Patient Account Number: [id number] Date of Birth/Sex: 11/17/1950  (67 y.o. M) Treating RN: Montey Hora Primary Care Justyce Baby: Kingsley Spittle Other Clinician: Referring Nirvi Boehler: Kingsley Spittle Treating Raegyn Renda/Extender: Melburn Hake, HOYT Weeks in Treatment: 0 Active Problems Location of Pain Severity and Description of Pain Patient Has Paino No Site Locations Pain Management and Medication Current Pain Management: Electronic Signature(s) Signed: 10/11/2018 4:45:05 PM By: Montey Hora Entered By: Montey Hora on 10/11/2018 11:00:35 Johnny [name] ([id number]) -------------------------------------------------------------------------------- Patient/Caregiver Education Details Patient Name: Johnny [name] Date of Service: 10/11/2018 9:45 AM Medical Record Number: [id number] Patient Account Number: [id number] Date of Birth/Gender: [birth year] (67 y.o. M) Treating RN: Cornell Barman Primary Care Physician: Kingsley Spittle Other Clinician: Referring Physician: Kingsley Spittle Treating Physician/Extender: Sharalyn Ink in Treatment: 0 Education Assessment Education Provided To: Patient Education Topics Provided Elevated Blood Sugar/ Impact on Healing: Handouts: Elevated Blood Sugars: How Do They Affect Wound Healing Methods: Demonstration, Explain/Verbal Responses: State content correctly Welcome To [facility] [place]: Handouts: Welcome To [facility] [place] Methods: Explain/Verbal Responses: State content correctly Wound Debridement: Handouts: Wound Debridement Methods: Demonstration, Explain/Verbal Responses: Reinforcements needed Wound/Skin Impairment: Handouts: Caring for Your Ulcer Methods: Demonstration, Explain/Verbal Responses: State content correctly Electronic Signature(s) Signed: 10/11/2018 5:05:56 PM By: Gretta Cool, BSN, RN, CWS, Kim RN, BSN Entered By: Gretta Cool, BSN, RN, CWS, Kim on 10/11/2018 11:54:01 Johnny [name] ([id number]) -------------------------------------------------------------------------------- Wound  Assessment Details Patient Name: Johnny [name] Date of Service: 10/11/2018 9:45 AM Medical Record Number: [id number] Patient Account Number: [id number] Date of Birth/Sex: Jun 22, 1950 (67 y.o. M) Treating RN: Montey Hora Primary Care Janai Maudlin: Kingsley Spittle Other Clinician: Referring Anakaren Campion: Kingsley Spittle Treating Terrell Ostrand/Extender: STONE III, HOYT Weeks in Treatment: 0 Wound Status Wound Number: 3 Primary Diabetic Wound/Ulcer of the Lower Extremity Etiology: Wound Location: Right Lower Leg - Posterior Wound Open Wounding Event: Gradually Appeared Status: Date Acquired: 10/02/2017 Comorbid Cataracts, Congestive Heart Failure, Deep Vein Weeks Of Treatment: 0 History: Thrombosis, Hypertension, Peripheral Venous Clustered Wound: No Disease, Type II Diabetes, End Stage Renal Pending Amputation On Presentation Disease, History of pressure wounds, Gout, Neuropathy Photos Wound Measurements Length: (cm) 17 % Reduction Width: (cm) 22 % Reduction Depth: (cm) 0.1 Epithelializ Area: (cm) 293.739 Tunneling: Volume: (cm) 29.374 Undermining in Area: in Volume: ation: None No : No Wound Description Classification: Grade 2 Foul Odor Af Wound Margin: Indistinct, nonvisible Due to Produ Exudate Amount: Large Slough/Fibri Exudate Type: Purulent Exudate Color: yellow, brown, green ter Cleansing: Yes ct Use: No no Yes Wound Bed Granulation Amount: Medium (34-66%) Exposed Structure Granulation Quality: Hyper-granulation Fascia Exposed: No Necrotic Amount: Medium (34-66%) Fat Layer (Subcutaneous Tissue) Exposed: Yes Necrotic Quality: Eschar, Adherent Slough Tendon Exposed: Yes Muscle Exposed: No Joint Exposed: No Bone Exposed: No Johnny [name], Johnny [name] ([id number]) Treatment Notes Wound #3 (Right, Posterior Lower Leg) Notes TCA, Non-adherent dressing, conform, tape to secure Electronic Signature(s) Signed: 10/11/2018 4:45:05 PM By: Montey Hora Entered By: Montey Hora on  10/11/2018 10:35:25 Johnny [name] ([id number]) -------------------------------------------------------------------------------- Wound Assessment Details Patient Name: Johnny [name] Date of Service: 10/11/2018 9:45 AM Medical Record Number: [id number] Patient Account Number: [id number] Date of Birth/Sex: [birth year] (67 y.o. M) Treating RN: Montey Hora Primary Care Kamla Skilton: Kingsley Spittle Other Clinician: Referring Virgene Tirone: Kingsley Spittle Treating Preslynn Bier/Extender: STONE III, HOYT Weeks in Treatment: 0 Wound Status Wound Number: 4 Primary Diabetic Wound/Ulcer of the Lower Extremity Etiology: Wound Location: Right Calcaneus - Lateral Wound Open Wounding Event: Gradually Appeared Status: Date Acquired: 11/06/2017  Comorbid Cataracts, Congestive Heart Failure, Deep Vein Weeks Of Treatment: 0 History: Thrombosis, Hypertension, Peripheral Venous Clustered Wound: No Disease, Type II Diabetes, End Stage Renal Pending Amputation On Presentation Disease, History of pressure wounds, Gout, Neuropathy Photos Wound Measurements Length: (cm) 2.8 % Reduction Width: (cm) 3.9 % Reduction Depth: (cm) 0.4 Epithelializ Area: (cm) 8.577 Tunneling: Volume: (cm) 3.431 Undermining in Area: in Volume: ation: None No : No Wound Description Classification: Grade 1 Foul Odor Af Wound Margin: Flat and Intact Slough/Fibri Exudate Amount: Medium Exudate Type: Purulent Exudate Color: yellow, brown, green ter Cleansing: No no Yes Wound Bed Granulation Amount: None Present (0%) Exposed Structure Necrotic Amount: Large (67-100%) Fascia Exposed: No Necrotic Quality: Eschar, Adherent Slough Fat Layer (Subcutaneous Tissue) Exposed: No Tendon Exposed: No Muscle Exposed: No Joint Exposed: No Bone Exposed: No Limited to Skin Breakdown Johnny [name], Johnny [name] ([id number]) Treatment Notes Wound #4 (Right, Lateral Calcaneus) Notes Dakins soaked gauze, ABD, Conform, tape to secure Electronic  Signature(s) Signed: 10/11/2018 4:45:05 PM By: Montey Hora Entered By: Montey Hora on 10/11/2018 10:38:29 Johnny [name] ([id number]) -------------------------------------------------------------------------------- Wound Assessment Details Patient Name: Johnny [name] Date of Service: 10/11/2018 9:45 AM Medical Record Number: [id number] Patient Account Number: [id number] Date of Birth/Sex: 1951/03/24 (67 y.o. M) Treating RN: Montey Hora Primary Care Nareg Breighner: Kingsley Spittle Other Clinician: Referring Amaya Blakeman: Kingsley Spittle Treating Tamiah Dysart/Extender: STONE III, HOYT Weeks in Treatment: 0 Wound Status Wound Number: 5 Primary Diabetic Wound/Ulcer of the Lower Extremity Etiology: Wound Location: Right Lower Leg - Anterior Wound Open Wounding Event: Gradually Appeared Status: Date Acquired: 11/06/2017 Comorbid Cataracts, Congestive Heart Failure, Deep Vein Weeks Of Treatment: 0 History: Thrombosis, Hypertension, Peripheral Venous Clustered Wound: No Disease, Type II Diabetes, End Stage Renal Pending Amputation On Presentation Disease, History of pressure wounds, Gout, Neuropathy Photos Wound Measurements Length: (cm) 10 % Reduction in Width: (cm) 7.8 % Reduction in Depth: (cm) 0.1 Epithelializat Area: (cm) 61.261 Tunneling: Volume: (cm) 6.126 Undermining: Area: 0% Volume: 0% ion: None No No Wound Description Classification: Grade 1 Foul Odor Afte Wound Margin: Flat and Intact Slough/Fibrino Exudate Amount: Small Exudate Type: Purulent Exudate Color: yellow, brown, green r Cleansing: No No Wound Bed Granulation Amount: None Present (0%) Exposed Structure Necrotic Amount: Large (67-100%) Fascia Exposed: No Necrotic Quality: Eschar Fat Layer (Subcutaneous Tissue) Exposed: No Tendon Exposed: No Muscle Exposed: No Joint Exposed: No Bone Exposed: No Limited to Skin Breakdown Johnny [name], Johnny [name] ([id number]) Treatment Notes Wound #5 (Right, Anterior Lower  Leg) Notes Dakins soaked gauze, ABD, Conform, tape to secure Electronic Signature(s) Signed: 10/11/2018 11:39:22 AM By: Montey Hora Entered By: Montey Hora on 10/11/2018 11:39:21 Johnny [name] ([id number]) -------------------------------------------------------------------------------- [place] Details Patient Name: Johnny [name] Date of Service: 10/11/2018 9:45 AM Medical Record Number: [id number] Patient Account Number: [id number] Date of Birth/Sex: 09/01/1950 (67 y.o. M) Treating RN: Montey Hora Primary Care Kathrina Crosley: Kingsley Spittle Other Clinician: Referring Yuriy Cui: Kingsley Spittle Treating Indica Marcott/Extender: Melburn Hake, HOYT Weeks in Treatment: 0 Vital Signs Time Taken: 10:15 Temperature (F): 98.2 Height (in): 68 Pulse (bpm): 94 Source: Measured Respiratory Rate (breaths/min): 16 Weight (lbs): 154 Blood Pressure (mmHg): 162/48 Source: Measured Reference Range: 80 - 120 mg / dl Body Mass Index (BMI): 23.4 Electronic Signature(s) Signed: 10/11/2018 4:45:05 PM By: Montey Hora Entered By: Montey Hora on 10/11/2018 11:01:47

## 2018-10-12 NOTE — Progress Notes (Signed)
ALASSANE, KALAFUT ([id number]) Visit Report for 10/11/2018 Chief Complaint Document Details Patient Name: Johnny [name], Johnny [name] Date of Service: 10/11/2018 9:45 AM Medical Record Number: [id number] Patient Account Number: [id number] Date of Birth/Sex: 1950/12/02 (68 y.o. M) Treating RN: Cornell Barman Primary Care Provider: Kingsley Spittle Other Clinician: Referring Provider: Kingsley Spittle Treating Provider/Extender: Melburn Hake, Aster Screws Weeks in Treatment: 0 Information Obtained from: Patient Chief Complaint Right LE Ulcers Electronic Signature(s) Signed: 10/12/2018 4:38:37 PM By: Worthy Keeler PA-C Entered By: Worthy Keeler on 10/11/2018 10:51:31 [name] ([id number]) -------------------------------------------------------------------------------- Debridement Details Patient Name: [name] Date of Service: 10/11/2018 9:45 AM Medical Record Number: [id number] Patient Account Number: [id number] Date of Birth/Sex: 1951/01/30 (68 y.o. M) Treating RN: Cornell Barman Primary Care Provider: Kingsley Spittle Other Clinician: Referring Provider: Kingsley Spittle Treating Provider/Extender: Melburn Hake, Graham Doukas Weeks in Treatment: 0 Debridement Performed for Wound #3 Right,Posterior Lower Leg Assessment: Performed By: Physician STONE III, Dwane Andres E., PA-C Debridement Type: Debridement Severity of Tissue Pre Necrosis of muscle Debridement: Level of Consciousness (Pre- Awake and Alert procedure): Pre-procedure Verification/Time Yes - 10:55 Out Taken: Start Time: 10:55 Pain Control: Lidocaine Total Area Debrided (L x W): 17 (cm) x 15 (cm) = 255 (cm) Tissue and other material Viable, Non-Viable, Eschar, Muscle, Slough, Subcutaneous, Fascia , Slough debrided: Level: Skin/Subcutaneous Tissue/Muscle Debridement Description: Excisional Instrument: Forceps, Scissors Bleeding: Minimum Hemostasis Achieved: Pressure End Time: 11:04 Response to Treatment: Procedure was tolerated well Level of  Consciousness Awake and Alert (Post-procedure): Post Debridement Measurements of Total Wound Length: (cm) 17 Width: (cm) 22 Depth: (cm) 0.1 Volume: (cm) 29.374 Character of Wound/Ulcer Post Debridement: Stable Severity of Tissue Post Debridement: Necrosis of muscle Post Procedure Diagnosis Same as Pre-procedure Electronic Signature(s) Signed: 10/11/2018 11:50:59 AM By: Gretta Cool, BSN, RN, CWS, Kim RN, BSN Signed: 10/12/2018 4:38:37 PM By: Worthy Keeler PA-C Entered By: Gretta Cool, BSN, RN, CWS, Kim on 10/11/2018 11:50:58 [name] ([id number]) -------------------------------------------------------------------------------- Debridement Details Patient Name: [name] Date of Service: 10/11/2018 9:45 AM Medical Record Number: [id number] Patient Account Number: [id number] Date of Birth/Sex: 1950/09/09 (68 y.o. M) Treating RN: Cornell Barman Primary Care Provider: Kingsley Spittle Other Clinician: Referring Provider: Kingsley Spittle Treating Provider/Extender: Melburn Hake, Alyviah Crandle Weeks in Treatment: 0 Debridement Performed for Wound #4 Right,Lateral Calcaneus Assessment: Performed By: Physician STONE III, Mikelle Myrick E., PA-C Debridement Type: Debridement Severity of Tissue Pre Fat layer exposed Debridement: Level of Consciousness (Pre- Awake and Alert procedure): Pre-procedure Verification/Time Yes - 10:55 Out Taken: Start Time: 10:55 Pain Control: Lidocaine Total Area Debrided (L x W): 2.8 (cm) x 3.9 (cm) = 10.92 (cm) Tissue and other material Viable, Non-Viable, Eschar, Slough, Subcutaneous, Slough debrided: Level: Skin/Subcutaneous Tissue Debridement Description: Excisional Instrument: Forceps, Scissors Bleeding: Minimum Hemostasis Achieved: Pressure End Time: 11:04 Response to Treatment: Procedure was tolerated well Level of Consciousness Awake and Alert (Post-procedure): Post Debridement Measurements of Total Wound Length: (cm) 2.8 Width: (cm) 3.9 Depth: (cm)  0.5 Volume: (cm) 4.288 Character of Wound/Ulcer Post Debridement: Requires Further Debridement Severity of Tissue Post Debridement: Fat layer exposed Post Procedure Diagnosis Same as Pre-procedure Electronic Signature(s) Signed: 10/11/2018 11:51:52 AM By: Gretta Cool, BSN, RN, CWS, Kim RN, BSN Signed: 10/12/2018 4:38:37 PM By: Worthy Keeler PA-C Entered By: Gretta Cool, BSN, RN, CWS, Kim on 10/11/2018 11:51:51 [name] ([id number]) -------------------------------------------------------------------------------- HPI Details Patient Name: [name] Date of Service: 10/11/2018 9:45 AM Medical Record Number: [id number] Patient Account Number: [id number] Date of Birth/Sex: [birth year] (68 y.o. M) Treating RN: Cornell Barman Primary Care Provider: Kingsley Spittle Other Clinician: Referring Provider:  Burns, Harriett Treating Provider/Extender: Melburn Hake, Demondre Aguas Weeks in Treatment: 0 History of Present Illness HPI Description: 03/20/18 patient presents today for initial evaluation and our clinic as result of issues that he has been having with his lower extremities. With that being said he actually is a patient of Dr. dew at [facility] as well. Subsequently he is actually scheduled for surgery with Dr. dew next week in regard to his right lower extremity where he does require and need a significant debridement of the posterior/lateral aspect of his leg. For that reason we really are not doing anything today to aid in this regard with treatment and are not establishing care for the right lower extremity since is gonna be undergoing debridement and is under the care of another physician for this. Subsequently part of the surgery on Wednesday is post involve the right fifth toe for amputation. With that being said we did look back in epic and I do not see any evidence of x-rays that would indicate osteomyelitis at the site. For that reason I did actually get in touch with Dr. Bunnie Domino office and  spoke with Joelene Millin the mid-level provider there. Subsequently, it does appear that the fifth toe is scheduled for invitation. With that being said I explained that on my evaluation today it did not appear that the patient has any evidence of infection at the site and there was no suggestion of gangrene at this time. The toenail was somewhat damaged and likely needs to be trimmed by podiatry. With that being said I do not see any evidence that this likely is going to require invitation unless there is further documentation of osteomyelitis or something worse that I have not seen at this point. She appreciated the information being relayed and states that she will follow-up with Dr. dew in order to check and see what they need to do as far as reevaluating the patient to ensure that nothing needs to be done for that toe was for this application is concerned. I discussed this with the patient today although I have not spoken with Dr. Bunnie Domino office at the time of the appointment this was later in the day. With that being said the patient does have an extensive vascular history including having had a most recent TBI examination on 02/27/18. The revealed that the patient had biphasic waveforms bilaterally in the foot along with a TBI of 0.93 on the right and a TBI of 0.56 on the left. This was obviously good news. His TBI on the left is a bit lower but still sounds to be better than what it was previous when he was being evaluated by Dr. dew. Again that was prior to the stenting. According to the records that were reviewed from the patient's primary care provider and following the history there it appears that the patient has been under the chair of Dr. dew for a number of years even at some point has seen Dr. Delana Meyer as well. He also sees Dr. Ellin Goodie at the [facility]. It does appear that she recently has had the patient on doxycycline due to what was felt to be an  infection. The patient was referred to wound care as things were becoming more complicated than what Dr. Quay Burow felt could be handled at her clinic. Lastly upon reviewing some of the notes through epic and the patient's appointments at the emergency department I could not find anything that indicated an x-ray of his right  foot at any point in the recent past. All the x-rays actually pointed to the left foot one was November 27, 2017 subsequently this was repeated on 02/28/18. Subsequently this shows that there was subtle osteolysis at the medial head of the fourth proximal phalanx which was suspicious for acute osteomyelitis and was not noted in the July radiographs. The patient does have no other osseous abnormality identified. With that being said I do not see any of the notes reviewed were this has been addressed in particular other than at the emergency department. No MRI has been ordered at this time. Readmission: 10/11/18 upon evaluation today patient actually has evidence of significant necrotic tissue noted over his right lower extremity. This is pretty much a circumferential issue. With that being said it does appear that there is muscle exposed and then muscle is necrotic at this point. He is been a patient of Dr. dew for some time now since I initially saw him back in November 2019. Obviously he's had a lot going on in the interim although it doesn't look like there's been a significant debridement. He most recently did have a arterial study performed on 08/31/18. 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 This was an improvement from the previous TBI of the left which was 0.59. Nonetheless he did undergo an arteriogram on 09/13/18 and it was noted by Dr. dew that his perfusion was adequate for wound healing and no revascularization was needed at that point. All this is excellent news. With that being said the patient is gonna require some MONTAE, STAGER  ([id number]) significant debridement in order to clear away some of the necrotic tissue at this point based on what I'm seeing. No fevers, chills, nausea, or vomiting noted at this time. Electronic Signature(s) Signed: 10/12/2018 4:38:37 PM By: Worthy Keeler PA-C Entered By: Worthy Keeler on 10/11/2018 15:03:01 [name] ([id number]) -------------------------------------------------------------------------------- Otelia Sergeant TISS Details Patient Name: [name] Date of Service: 10/11/2018 9:45 AM Medical Record Number: [id number] Patient Account Number: [id number] Date of Birth/Sex: [birth year] (68 y.o. M) Treating RN: Cornell Barman Primary Care Provider: Kingsley Spittle Other Clinician: Referring Provider: Kingsley Spittle Treating Provider/Extender: Melburn Hake, Omair Dettmer Weeks in Treatment: 0 Procedure Performed for: Wound #3 Right,Posterior Lower Leg Performed By: Physician STONE III, Shanelle Clontz E., PA-C Post Procedure Diagnosis Same as Pre-procedure Notes 8 Silver Nitrate sticks used. Electronic Signature(s) Signed: 10/11/2018 11:54:51 AM By: Gretta Cool, BSN, RN, CWS, Kim RN, BSN Entered By: Gretta Cool, BSN, RN, CWS, Kim on 10/11/2018 11:54:50 MOLLY, MASELLI ([id number]) -------------------------------------------------------------------------------- Physical Exam Details Patient Name: [name] Date of Service: 10/11/2018 9:45 AM Medical Record Number: [id number] Patient Account Number: [id number] Date of Birth/Sex: 1951/03/08 (68 y.o. M) Treating RN: Cornell Barman Primary Care Provider: Kingsley Spittle Other Clinician: Referring Provider: Kingsley Spittle Treating Provider/Extender: Melburn Hake, Newell Frater Weeks in Treatment: 0 Constitutional patient is hypertensive.. pulse regular and within target range for patient.[name] respirations regular, non-labored and within target range for patient.[name] temperature within target range for patient.. Well-nourished and well-hydrated in no acute  distress. Eyes conjunctiva clear no eyelid edema noted. pupils equal round and reactive to light and accommodation. Ears, Nose, Mouth, and Throat no gross abnormality of ear auricles or external auditory canals. normal hearing noted during conversation. mucus membranes moist. Respiratory normal breathing without difficulty. clear to auscultation bilaterally. Cardiovascular regular rate and rhythm with normal S1, S2. 1+ dorsalis pedis/posterior tibialis pulses. 1+ pitting edema of the bilateral  lower extremities. Gastrointestinal (GI) soft, non-tender, non-distended, +BS. no ventral hernia noted. Musculoskeletal normal gait and posture. hammer toes. Psychiatric this patient is able to make decisions and demonstrates good insight into disease process. Alert and Oriented x 3. pleasant and cooperative. Notes Upon evaluation today actually did perform a quite significant debridement of the patient's right lower extremity during the office visit today. Actually removed eschar, necrotic subcutaneous tissue, and necrotic muscle from multiple locations around the circumferential wound pretty much having to debride the entire region. He tolerated this without any discomfort the good news is he doesn't appear to be having any pain. With that being said that I was able to remove a significant portion of the necrotic material that was still some that is gonna require ongoing debridement unfortunately. It is going to be some time for sure and explain to this to the patient today as far as getting the area to completely healed. Post debridement however the wound bed does appear to be doing quite a bit better which is at least good news. Electronic Signature(s) Signed: 10/12/2018 4:38:37 PM By: Worthy Keeler PA-C Entered By: Worthy Keeler on 10/11/2018 15:04:44 [name] ([id number]) -------------------------------------------------------------------------------- Physician Orders  Details Patient Name: [name] Date of Service: 10/11/2018 9:45 AM Medical Record Number: [id number] Patient Account Number: [id number] Date of Birth/Sex: 16-Oct-1950 (68 y.o. 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 Wound #4 Right,Lateral Calcaneus o Clean wound with Normal Saline. Wound #5 Right,Anterior Lower Leg o Clean wound with Normal Saline. Wound #6 Right,Anterior Lower Leg o Clean wound with Normal Saline. Anesthetic (add to Medication List) Wound #3 Right,Posterior Lower Leg o Topical Lidocaine 4% cream applied to wound bed prior to debridement (In Clinic Only). Wound #4 Right,Lateral Calcaneus o Topical Lidocaine 4% cream applied to wound bed prior to debridement (In Clinic Only). Wound #5 Right,Anterior Lower Leg o Topical Lidocaine 4% cream applied to wound bed prior to debridement (In Clinic Only). Wound #6 Right,Anterior Lower Leg o Topical Lidocaine 4% cream applied to wound bed prior to debridement (In Clinic Only). Primary Wound Dressing Wound #3 Right,Posterior Lower Leg o Other: - dakins soaked gauze Wound #4 Right,Lateral Calcaneus o Other: - dakins soaked gauze Ide, Kevyn ([id number]) Wound #5 Right,Anterior Lower Leg o Other: - dakins  soaked gauze Wound #6 Right,Anterior Lower Leg o Other: - dakins soaked gauze Secondary Dressing Wound #3 Right,Posterior Lower Leg o ABD and Kerlix/Conform Wound #4 Right,Lateral Calcaneus o ABD and Kerlix/Conform Wound #5 Right,Anterior Lower Leg o ABD and Kerlix/Conform Wound #6 Right,Anterior Lower Leg o ABD and Kerlix/Conform Dressing Change Frequency Wound #3 Right,Posterior Lower Leg o Change dressing every day. - Patient to change days that [facility] is not scheduled o Change Dressing Tuesday and Thursday. - Home health nurse Wound #4 Right,Lateral Calcaneus o Change dressing every day. - Patient to change days that [facility] is not scheduled o Change Dressing Tuesday  and Thursday. - Home health nurse Wound #5 Right,Anterior Lower Leg o Change dressing every day. - Patient to change days that [facility] is not scheduled o Change Dressing Tuesday and Thursday. - Home health nurse Wound #6 Right,Anterior Lower Leg o Change dressing every day. - Patient to change days that [facility] is not scheduled o Change Dressing Tuesday and Thursday. - Home health nurse Follow-up Appointments Wound #3 Right,Posterior Lower Leg o Return Appointment in 1 week. Wound #4 Right,Lateral Calcaneus o Return Appointment in 1 week. Wound #5 Right,Anterior Lower Leg o Return Appointment in 1 week. Wound #6 Right,Anterior Lower Leg o Return Appointment in 1 week. Home Health Wound #3 [place] Visits - [place] Nurse may visit PRN to address patientos wound care needs. o FACE TO FACE ENCOUNTER: MEDICARE and MEDICAID PATIENTS: I certify that this patient is under my care and that I had a face-to-face encounter that meets the physician face-to-face encounter requirements with this JAECE, DUCHARME ([id number]) patient on this date. The encounter with the patient was in whole or in part for the following MEDICAL CONDITION:  (primary reason for [place]) MEDICAL NECESSITY: I certify, that based on my findings, NURSING services are a medically necessary home health service. HOME BOUND STATUS: I certify that my clinical findings support that this patient is homebound (i.e., Due to illness or injury, pt requires aid of supportive devices such as crutches, cane, wheelchairs, walkers, the use of special transportation or the assistance of another person to leave their place of residence. There is a normal inability to leave the home and doing so requires considerable and taxing effort. Other absences are for medical reasons / religious services and are infrequent or of short duration when for other reasons). o If current dressing causes regression in wound condition, may D/C ordered dressing product/s and apply Normal Saline Moist Dressing daily until next [place] / Other MD appointment. [place] of regression in wound condition at [phone]. o Please direct any NON-WOUND related issues/requests for orders to patient's Primary Care Physician Wound #4 [place] Visits - [place] Nurse may visit PRN to address patientos wound care needs. o FACE TO FACE ENCOUNTER: MEDICARE and MEDICAID PATIENTS: I certify that this patient is under my care and that I had a face-to-face encounter that meets the physician face-to-face encounter requirements with this patient on this date. The encounter with the patient was in whole or in part for the following MEDICAL CONDITION: (primary reason for [place]) MEDICAL NECESSITY: I certify, that based on my findings, NURSING services are a medically necessary home health service. HOME BOUND STATUS: I certify that my clinical findings support that this patient is homebound (i.e., Due to illness or injury, pt requires aid of supportive devices such as crutches, cane, wheelchairs, walkers,  the use of special transportation or the assistance of another person to leave their place of residence. There is a normal inability to leave the home and doing so requires considerable and taxing effort. Other absences are for medical reasons / religious services and are infrequent or of short duration when for other reasons). o If current dressing causes regression in wound condition, may D/C ordered dressing product/s and apply Normal Saline Moist Dressing daily until next [place] / Other MD appointment. [place] of regression in wound condition at [phone]. o Please direct any NON-WOUND related issues/requests for orders  to patient's Primary Care Physician Wound #5 [place] Visits - [place] Nurse may visit PRN to address patientos wound care needs. o FACE TO FACE ENCOUNTER: MEDICARE and MEDICAID PATIENTS: I certify that this patient is under my care and that I had a face-to-face encounter that meets the physician face-to-face encounter requirements with this patient on this date. The encounter with the patient was in whole or in part for the following MEDICAL CONDITION: (primary reason for [place]) MEDICAL NECESSITY: I certify, that based on my findings, NURSING services are a medically necessary home health service. HOME BOUND STATUS: I certify that my clinical findings support that this patient is homebound (i.e., Due to illness or injury, pt requires aid of supportive devices such as crutches, cane, wheelchairs, walkers, the use of special transportation or the assistance of another person to leave their place of residence. There is a normal inability to leave the home and doing so requires considerable and taxing effort. Other absences are for medical reasons / religious services and are infrequent or of short duration when for other reasons). o If current dressing causes  regression in wound condition, may D/C ordered dressing product/s and apply Normal Saline Moist Dressing daily until next [place] / Other MD appointment. [place] of regression in wound condition at [phone]. o Please direct any NON-WOUND related issues/requests for orders to patient's Primary Care Physician Wound #6 [place] Visits - [place] Nurse may visit PRN to address patientos wound care needs. o FACE TO FACE ENCOUNTER: MEDICARE and MEDICAID PATIENTS: I certify that this patient is under my care and that I had a face-to-face encounter that meets the physician face-to-face encounter requirements with this patient on this date. The encounter with the patient was in whole or in part for the following MEDICAL CONDITION: (primary reason for [place]) MEDICAL NECESSITY: I certify, that based on my findings, NURSING services are a medically necessary home health service. HOME BOUND STATUS: I certify that my clinical findings support that this patient is homebound (i.e., Due to illness or injury, pt requires aid of supportive devices such as crutches, cane, wheelchairs, walkers, the use of special transportation or the [place], MERLE ([id number]) assistance of another person to leave their place of residence. There is a normal inability to leave the home and doing so requires considerable and taxing effort. Other absences are for medical reasons / religious services and are infrequent or of short duration when for other reasons). o If current dressing causes regression in wound condition, may D/C ordered dressing product/s and apply Normal Saline Moist Dressing daily until next [place] / Other MD appointment. [place] of regression in wound condition at [phone]. o Please direct any NON-WOUND related issues/requests for orders to patient's Primary Care  Physician Radiology o X-ray, lower leg - Right Tib/Fib Electronic Signature(s) Signed: 10/11/2018 5:05:56 PM By: Gretta Cool, BSN, RN, CWS, Kim RN, BSN Signed: 10/12/2018 4:38:37 PM By: Worthy Keeler PA-C Entered By: Gretta Cool, BSN, RN, CWS, Kim on 10/11/2018 11:20:01 KABEER, HOAGLAND ([id number]) -------------------------------------------------------------------------------- Problem List Details Patient Name: [name] Date of Service: 10/11/2018 9:45 AM Medical Record Number: [id number] Patient Account Number: [id number] Date of Birth/Sex: 1950/05/21 (68 y.o. 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 10/11/2018 No Yes necrosis of muscle N18.6 End stage renal disease 10/11/2018 No Yes Z99.2 Dependence on renal dialysis 10/11/2018 No Yes I73.89 Other specified peripheral vascular diseases 10/11/2018 No Yes Z79.01 Long term (current) use of anticoagulants 10/11/2018 No Yes Inactive Problems Resolved Problems Electronic Signature(s) Signed: 10/12/2018 4:38:37 PM By: Worthy Keeler PA-C Entered By: Worthy Keeler on 10/11/2018 11:20:00 [name] ([id number]) -------------------------------------------------------------------------------- Progress Note Details Patient Name: [name] Date of Service: 10/11/2018 9:45 AM Medical Record Number: [id number] Patient Account Number: [id number] Date of Birth/Sex: Sep 08, 1950 (68 y.o. M) Treating RN: Cornell Barman Primary Care Provider: Kingsley Spittle Other Clinician: Referring  Provider: Kingsley Spittle Treating Provider/Extender: Melburn Hake, Saydi Kobel Weeks in Treatment: 0 Subjective Chief Complaint Information obtained from Patient Right LE Ulcers History of Present Illness (HPI) 03/20/18 patient presents today for initial evaluation and our clinic as result of issues that he has been having with his lower extremities. With that being said he actually is a patient of Dr. dew at [facility] as well. Subsequently he is actually scheduled for surgery with Dr. dew next week in regard to his right lower extremity where he does require and need a significant debridement of the posterior/lateral aspect of his leg. For that reason we really are not doing anything today to aid in this regard with treatment and are not establishing care for the right lower extremity since is gonna be undergoing debridement and is under the care of another physician for this. Subsequently part of the surgery on Wednesday is post involve the right fifth toe for amputation. With that being said we did look back in epic and I do not see any evidence of x-rays that would indicate osteomyelitis at the site. For that reason I did actually get in touch with Dr. Bunnie Domino office and spoke with Joelene Millin the mid-level provider there. Subsequently, it does appear that the fifth toe is scheduled for invitation. With that being said I explained that on my evaluation today it did not appear that the patient has any evidence of infection at the site and there was no suggestion of gangrene at this time. The toenail was somewhat damaged and likely needs to be trimmed by podiatry. With that being said I do not see any evidence that this likely is going to require invitation unless there is further documentation of osteomyelitis or something worse that I have not seen at this point. She appreciated the information being relayed and states that she will follow-up with Dr. dew in order to check and see what  they need to do as far as reevaluating the patient to ensure that nothing needs to be done for that toe was for this application is concerned. I discussed this with the patient today although I have not spoken with Dr. Bunnie Domino office at the time of the appointment this was later in the day. With that being said the patient does have an extensive vascular history including having had a most recent TBI examination on 02/27/18. The revealed that the patient had biphasic waveforms bilaterally in the foot along with a TBI of 0.93 on the right and a TBI of 0.56 on the left. This was obviously good news. His TBI on the  left is a bit lower but still sounds to be better than what it was previous when he was being evaluated by Dr. dew. Again that was prior to the stenting. According to the records that were reviewed from the patient's primary care provider and following the history there it appears that the patient has been under the chair of Dr. dew for a number of years even at some point has seen Dr. Delana Meyer as well. He also sees Dr. Ellin Goodie at the [facility]. It does appear that she recently has had the patient on doxycycline due to what was felt to be an infection. The patient was referred to wound care as things were becoming more complicated than what Dr. Quay Burow felt could be handled at her clinic. Lastly upon reviewing some of the notes through epic and the patient's appointments at the emergency department I could not find anything that indicated an x-ray of his right foot at any point in the recent past. All the x-rays actually pointed to the left foot one was November 27, 2017 subsequently this was repeated on 02/28/18. Subsequently this shows that there was subtle osteolysis at the medial head of the fourth proximal phalanx which was suspicious for acute osteomyelitis and was not noted in the July radiographs. The patient does have no other osseous abnormality identified.  With that being said I do not see any of the notes reviewed were this has been addressed in particular other than at the emergency department. No MRI has been ordered at this time. Readmission: 10/11/18 upon evaluation today patient actually has evidence of significant necrotic tissue noted over his right lower extremity. This is pretty much a circumferential issue. With that being said it does appear that there is muscle exposed and then muscle is necrotic at this point. He is been a patient of Dr. dew for some time now since I initially saw him back in November 2019. Obviously he's had a lot going on in the interim although it doesn't look like there's been a significant debridement. He most Basley, Hassani ([id number]) recently did have a arterial study performed on 08/31/18. 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 This was an improvement from the previous TBI of the left which was 0.59. Nonetheless he did undergo an arteriogram on 09/13/18 and it was noted by Dr. dew that his perfusion was adequate for wound healing and no revascularization was needed at that point. All this is excellent news. With that being said the patient is gonna require some significant debridement in order to clear away some of the necrotic tissue at this point based on what I'm seeing. No fevers, chills, nausea, or vomiting noted at this time. Patient History Information obtained from Patient. Allergies shellfish derived Family History Diabetes - Siblings,Mother, Hypertension - Siblings, Kidney Disease - Siblings,Mother, No family history of Cancer, Heart Disease, Lung Disease, Seizures, Stroke, Thyroid Problems, Tuberculosis. Social History Former smoker, Marital Status - Divorced, Alcohol Use - Never, Drug Use - No History, Caffeine Use - Rarely. Medical History Eyes Patient has history of Cataracts Denies history of Glaucoma, Optic  Neuritis Ear/Nose/Mouth/Throat Denies history of Chronic sinus problems/congestion, Middle ear problems Hematologic/Lymphatic Denies history of Anemia, Hemophilia, Human Immunodeficiency Virus, Lymphedema, Sickle Cell Disease Respiratory Denies history of Aspiration, Asthma, Chronic Obstructive Pulmonary Disease (COPD), Pneumothorax, Sleep Apnea, Tuberculosis Cardiovascular Patient has history of Congestive Heart Failure, Deep Vein Thrombosis, Hypertension, Peripheral Venous Disease  Denies history of Angina, Arrhythmia, Coronary Artery Disease, Hypotension, Myocardial Infarction, Peripheral Arterial Disease, Phlebitis, Vasculitis Gastrointestinal Denies history of Cirrhosis , Colitis, Crohn s, Hepatitis A, Hepatitis B, Hepatitis C Endocrine Patient has history of Type II Diabetes Denies history of Type I Diabetes Genitourinary Patient has history of 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 Immunological Denies history of Lupus Erythematosus, Raynaud s, Scleroderma Integumentary (Skin) Patient has history of History of pressure wounds Denies history of History of Burn Musculoskeletal Patient has history of Gout Denies history of Rheumatoid Arthritis, Osteoarthritis, Osteomyelitis Neurologic Patient has history of Neuropathy - right hand Denies history of Dementia, Quadriplegia, Paraplegia, Seizure Disorder Oncologic Denies history of Received Chemotherapy, Received Radiation Psychiatric Denies history of Anorexia/bulimia, Confinement Anxiety Rafalski, Iniko ([id number]) Review of Systems (ROS) Eyes Denies complaints or symptoms of Dry Eyes, Vision Changes, Glasses / Contacts. Ear/Nose/Mouth/Throat Denies complaints or symptoms of Difficult clearing ears, Sinusitis. Hematologic/Lymphatic Denies complaints or symptoms of Bleeding / Clotting Disorders, Human Immunodeficiency Virus. Respiratory Denies complaints or symptoms of Chronic or frequent coughs, Shortness of  Breath. Cardiovascular Denies complaints or symptoms of Chest pain, LE edema. Gastrointestinal Denies complaints or symptoms of Frequent diarrhea, Nausea, Vomiting. Endocrine Denies complaints or symptoms of Hepatitis, Thyroid disease, Polydypsia (Excessive Thirst). Genitourinary Complains or has symptoms of Kidney failure/ Dialysis. Denies complaints or symptoms of Incontinence/dribbling. Immunological Denies complaints or symptoms of Hives, Itching. Integumentary (Skin) Complains or has symptoms of Wounds. Denies complaints or symptoms of Bleeding or bruising tendency, Breakdown, Swelling. Musculoskeletal Denies complaints or symptoms of Muscle Pain, Muscle Weakness. Neurologic Complains or has symptoms of Numbness/parasthesias - right hand. Denies complaints or symptoms of Focal/Weakness. Psychiatric Denies complaints or symptoms of Anxiety, Claustrophobia. Objective Constitutional patient is hypertensive.. pulse regular and within target range for patient.[name] respirations regular, non-labored and within target range for patient.[name] temperature within target range for patient.. Well-nourished and well-hydrated in no acute distress. Vitals Time Taken: 10:15 AM, Height: 68 in, Source: Measured, Weight: 154 lbs, Source: Measured, BMI: 23.4, Temperature: 98.2 F, Pulse: 94 bpm, Respiratory Rate: 16 breaths/min, Blood Pressure: 162/48 mmHg. Eyes conjunctiva clear no eyelid edema noted. pupils equal round and reactive to light and accommodation. Ears, Nose, Mouth, and Throat no gross abnormality of ear auricles or external auditory canals. normal hearing noted during conversation. mucus membranes moist. Respiratory normal breathing without difficulty. clear to auscultation bilaterally. JAYLEEN, AFONSO ([id number]) Cardiovascular regular rate and rhythm with normal S1, S2. 1+ dorsalis pedis/posterior tibialis pulses. 1+ pitting edema of the bilateral lower extremities. Gastrointestinal  (GI) soft, non-tender, non-distended, +BS. no ventral hernia noted. Musculoskeletal normal gait and posture. hammer toes. Psychiatric this patient is able to make decisions and demonstrates good insight into disease process. Alert and Oriented x 3. pleasant and cooperative. General Notes: Upon evaluation today actually did perform a quite significant debridement of the patient's right lower extremity during the office visit today. Actually removed eschar, necrotic subcutaneous tissue, and necrotic muscle from multiple locations around the circumferential wound pretty much having to debride the entire region. He tolerated this without any discomfort the good news is he doesn't appear to be having any pain. With that being said that I was able to remove a significant portion of the necrotic material that was still some that is gonna require ongoing debridement unfortunately. It is going to be some time for sure and explain to this to the patient today as far as getting the area to completely healed. Post debridement however the wound bed does appear  to be doing quite a bit better which is at least good news. Integumentary (Hair, Skin) Wound #3 status is Open. Original cause of wound was Gradually Appeared. The wound is located on the Right,Posterior Lower Leg. The wound measures 17cm length x 22cm width x 0.1cm depth; 293.739cm^2 area and 29.374cm^3 volume. There is tendon and Fat Layer (Subcutaneous Tissue) Exposed exposed. There is no tunneling or undermining noted. There is a large amount of purulent drainage noted. Foul odor after cleansing was noted. The wound margin is indistinct and nonvisible. There is medium (34-66%) hyper - granulation within the wound bed. There is a medium (34-66%) amount of necrotic tissue within the wound bed including Eschar and Adherent Slough. Wound #4 status is Open. Original cause of wound was Gradually Appeared. The wound is located on the  Right,Lateral Calcaneus. The wound measures 2.8cm length x 3.9cm width x 0.4cm depth; 8.577cm^2 area and 3.431cm^3 volume. The wound is limited to skin breakdown. There is no tunneling or undermining noted. There is a medium amount of purulent drainage noted. The wound margin is flat and intact. There is no granulation within the wound bed. There is a large (67-100%) amount of necrotic tissue within the wound bed including Eschar and Adherent Slough. Wound #5 status is Open. Original cause of wound was Gradually Appeared. The wound is located on the Right,Anterior Lower Leg. The wound measures 10cm length x 7.8cm width x 0.1cm depth; 61.261cm^2 area and 6.126cm^3 volume. The wound is limited to skin breakdown. There is no tunneling or undermining noted. There is a small amount of purulent drainage noted. The wound margin is flat and intact. There is no granulation within the wound bed. There is a large (67-100%) amount of necrotic tissue within the wound bed including Eschar. 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 There was a Excisional Skin/Subcutaneous Tissue/Muscle Debridement with a total area of 255 sq cm performed by STONE III, Jaxson Keener E., PA-C. With the following instrument(s): Forceps, and Scissors to remove Viable and Non-Viable tissue/material. Material removed includes Muscle, Eschar, Subcutaneous Tissue, Slough,  and Fascia after achieving pain control using Lidocaine. No specimens were taken. A time out was conducted at 10:55, prior to the start of the procedure. A Minimum amount of bleeding was controlled with Pressure. The procedure was tolerated well. Post Debridement Measurements: 17cm length x 22cm width x 0.1cm depth; 29.374cm^3 volume. Character of Wound/Ulcer Post Debridement is stable. Severity of Tissue Post Debridement is: Necrosis of muscle. Post procedure Diagnosis Wound #3: Same as Pre-Procedure Pre-procedure diagnosis of Wound #3 is a Diabetic Wound/Ulcer of the Lower Extremity located on the Right,Posterior Lower Leg . An CHEM CAUT GRANULATION TISS procedure was performed by STONE III, Nguyen Butler E., PA-C. Post procedure Diagnosis Wound #3: Same as Pre-Procedure Notes: 8 Silver Nitrate sticks used. Wound #4 Pre-procedure diagnosis of Wound #4 is a Diabetic Wound/Ulcer of the Lower Extremity located on the Right,Lateral Calcaneus .Severity of Tissue Pre Debridement is: Fat layer exposed. There was a Excisional Skin/Subcutaneous Tissue Debridement  with a total area of 10.92 sq cm performed by STONE III, Kaloni Bisaillon E., PA-C. With the following instrument(s): Forceps, and Scissors to remove Viable and Non-Viable tissue/material. Material removed includes Eschar, Subcutaneous Tissue, and Slough after achieving pain control using Lidocaine. No specimens were taken. A time out was conducted at 10:55, prior to the start of the procedure. A Minimum amount of bleeding was controlled with Pressure. The procedure was tolerated well. Post Debridement Measurements: 2.8cm length x 3.9cm width x 0.5cm depth; 4.288cm^3 volume. Character of Wound/Ulcer Post Debridement requires further debridement. Severity of Tissue Post Debridement is: Fat layer exposed. Post procedure Diagnosis Wound #4: Same as Pre-Procedure Plan Wound Cleansing: Wound #3 Right,Posterior Lower Leg: Clean wound with Normal Saline. Wound #4  Right,Lateral Calcaneus: Clean wound with Normal Saline. Wound #5 Right,Anterior Lower Leg: Clean wound with Normal Saline. Wound #6 Right,Anterior Lower Leg: Clean wound with Normal Saline. Anesthetic (add to Medication List): Wound #3 Right,Posterior Lower Leg: Topical Lidocaine 4% cream applied to wound bed prior to debridement (In Clinic Only). Wound #4 Right,Lateral Calcaneus: Andrades, Valentin ([id number]) Topical Lidocaine 4% cream applied to wound bed prior to debridement (In Clinic Only). Wound #5 Right,Anterior Lower Leg: Topical Lidocaine 4% cream applied to wound bed prior to debridement (In Clinic Only). Wound #6 Right,Anterior Lower Leg: Topical Lidocaine 4% cream applied to wound bed prior to debridement (In Clinic Only). Primary Wound Dressing: Wound #3 Right,Posterior Lower Leg: Other: - dakins soaked gauze Wound #4 Right,Lateral Calcaneus: Other: - dakins soaked gauze Wound #5 Right,Anterior Lower Leg: Other: - dakins soaked gauze Wound #6 Right,Anterior Lower Leg: Other: - dakins soaked gauze Secondary Dressing: Wound #3 Right,Posterior Lower Leg: ABD and Kerlix/Conform Wound #4 Right,Lateral Calcaneus: ABD and Kerlix/Conform Wound #5 Right,Anterior Lower Leg: ABD and Kerlix/Conform Wound #6 Right,Anterior Lower Leg: ABD and Kerlix/Conform Dressing Change Frequency: Wound #3 Right,Posterior Lower Leg: Change dressing every day. - Patient to change days that [facility] is not scheduled Change Dressing Tuesday and Thursday. - Home health nurse Wound #4 Right,Lateral Calcaneus: Change dressing every day. - Patient to change days that [facility] is not scheduled Change Dressing Tuesday and Thursday. - Home health nurse Wound #5 Right,Anterior Lower Leg: Change dressing every day. - Patient to change days that [facility] is not scheduled Change Dressing Tuesday and Thursday. - Home health nurse Wound #6 Right,Anterior Lower Leg: Change dressing every day. - Patient to change  days that [facility] is not scheduled Change Dressing Tuesday and Thursday. - Home health nurse Follow-up Appointments: Wound #3 Right,Posterior Lower Leg: Return Appointment in 1 week. Wound #4 Right,Lateral Calcaneus: Return Appointment in 1 week. Wound #5 Right,Anterior Lower Leg: Return Appointment in 1 week. Wound #6 Right,Anterior Lower Leg: Return Appointment in 1 week. Home Health: Wound #3 Right,Posterior Lower Leg: Continue Home Health Visits - [place] Nurse may visit PRN to address patient s wound care needs. FACE TO FACE ENCOUNTER: MEDICARE and MEDICAID PATIENTS: I certify that this patient is under my care and that I had a face-to-face encounter that meets the physician face-to-face encounter requirements with this patient on this date. The encounter with the patient was in whole or in part for the following MEDICAL CONDITION: (primary reason for [place]) MEDICAL NECESSITY: I certify, that based on my findings, NURSING services are a medically necessary home health service. HOME BOUND STATUS: I certify that my clinical findings support that this patient is homebound (i.e., Due to illness or injury, pt requires aid of supportive devices such as  crutches, cane, wheelchairs, walkers, the use of special transportation or the assistance of another person to leave their place of residence. There is a normal inability to leave the home and doing so requires considerable and taxing effort. Other absences are for medical reasons / religious services and are infrequent or of short duration when for other reasons). If current dressing causes regression in wound condition, may D/C ordered dressing product/s and apply Normal Saline Moist Dressing daily until next [place] / Other MD appointment. [place] of regression in [place], [place] ([id number]) wound condition at [phone]. Please direct any NON-WOUND related issues/requests for orders  to patient's Primary Care Physician Wound #4 Right,Lateral Calcaneus: [place] Visits - [place] Nurse may visit PRN to address patient s wound care needs. FACE TO FACE ENCOUNTER: MEDICARE and MEDICAID PATIENTS: I certify that this patient is under my care and that I had a face-to-face encounter that meets the physician face-to-face encounter requirements with this patient on this date. The encounter with the patient was in whole or in part for the following MEDICAL CONDITION: (primary reason for [place]) MEDICAL NECESSITY: I certify, that based on my findings, NURSING services are a medically necessary home health service. HOME BOUND STATUS: I certify that my clinical findings support that this patient is homebound (i.e., Due to illness or injury, pt requires aid of supportive devices such as crutches, cane, wheelchairs, walkers, the use of special transportation or the assistance of another person to leave their place of residence. There is a normal inability to leave the home and doing so requires considerable and taxing effort. Other absences are for medical reasons / religious services and are infrequent or of short duration when for other reasons). If current dressing causes regression in wound condition, may D/C ordered dressing product/s and apply Normal Saline Moist Dressing daily until next [place] / Other MD appointment. [place] of regression in wound condition at [phone]. Please direct any NON-WOUND related issues/requests for orders to patient's Primary Care Physician Wound #5 Right,Anterior Lower Leg: [place] Visits - [place] Nurse may visit PRN to address patient s wound care needs. FACE TO FACE ENCOUNTER: MEDICARE and MEDICAID PATIENTS: I certify that this patient is under my care and that I had a face-to-face encounter that meets the physician face-to-face encounter requirements with  this patient on this date. The encounter with the patient was in whole or in part for the following MEDICAL CONDITION: (primary reason for [place]) MEDICAL NECESSITY: I certify, that based on my findings, NURSING services are a medically necessary home health service. HOME BOUND STATUS: I certify that my clinical findings support that this patient is homebound (i.e., Due to illness or injury, pt requires aid of supportive devices such as crutches, cane, wheelchairs, walkers, the use of special transportation or the assistance of another person to leave their place of residence. There is a normal inability to leave the home and doing so requires considerable and taxing effort. Other absences are for medical reasons / religious services and are infrequent or of short duration when for other reasons). If current dressing causes regression in wound condition, may D/C ordered dressing product/s and apply Normal Saline Moist Dressing daily until next [place] / Other MD appointment. [place] of regression in wound condition at [phone]. Please direct any NON-WOUND related issues/requests for orders to patient's Primary Care Physician Wound #6 Right,Anterior Lower Leg:  Continue Home Health Visits - [place] Nurse may visit PRN to address patient s wound care needs. FACE TO FACE ENCOUNTER: MEDICARE and MEDICAID PATIENTS: I certify that this patient is under my care and that I had a face-to-face encounter that meets the physician face-to-face encounter requirements with this patient on this date. The encounter with the patient was in whole or in part for the following MEDICAL CONDITION: (primary reason for [place]) MEDICAL NECESSITY: I certify, that based on my findings, NURSING services are a medically necessary home health service. HOME BOUND STATUS: I certify that my clinical findings support that this patient is homebound (i.e., Due  to illness or injury, pt requires aid of supportive devices such as crutches, cane, wheelchairs, walkers, the use of special transportation or the assistance of another person to leave their place of residence. There is a normal inability to leave the home and doing so requires considerable and taxing effort. Other absences are for medical reasons / religious services and are infrequent or of short duration when for other reasons). If current dressing causes regression in wound condition, may D/C ordered dressing product/s and apply Normal Saline Moist Dressing daily until next [place] / Other MD appointment. Chance of regression in wound condition at [phone]. Please direct any NON-WOUND related issues/requests for orders to patient's Primary Care Physician Radiology ordered were: X-ray, lower leg - Right Tib/Fib At this point I think that a focus on trying to debride away the necrotic material is still gonna be the biggest thing to focus on at this time. I think that Dakin s soaked gauze dressing will be appropriate although he needs to actually change these two times a day to prevent the wounds from drying out. If done in this manner I think that he will actually do quite well. With that being said I would like to have him have an x-ray of the right tibia/fibula and we will subsequently see were the shows at follow-up. If KEVAUGHN, EWING ([id number]) anything changes or worsens in the meantime he will contact the office and let me know. Otherwise I'll see were things stand when we get follow-up. Please see above for specific wound care orders. We will see patient for re-evaluation in 1 week(s) here in the clinic. If anything worsens or changes patient will contact our office for additional recommendations. Electronic Signature(s) Signed: 10/12/2018 4:38:37 PM By: Worthy Keeler PA-C Entered By: Worthy Keeler on 10/11/2018 15:05:43 WENDLE, KINA  ([id number]) -------------------------------------------------------------------------------- ROS/PFSH Details Patient Name: [name] Date of Service: 10/11/2018 9:45 AM Medical Record Number: [id number] Patient Account Number: [id number] Date of Birth/Sex: 1950-09-17 (68 y.o. M) Treating RN: Montey Hora Primary Care Provider: Kingsley Spittle Other Clinician: Referring Provider: Kingsley Spittle Treating Provider/Extender: Melburn Hake, Luana Tatro Weeks in Treatment: 0 Information Obtained From Patient Eyes Complaints and Symptoms: Negative for: Dry Eyes; Vision Changes; Glasses / Contacts Medical History: Positive for: Cataracts Negative for: Glaucoma; Optic Neuritis Ear/Nose/Mouth/Throat Complaints and Symptoms: Negative for: Difficult clearing ears; Sinusitis Medical History: Negative for: Chronic sinus problems/congestion; Middle ear problems Hematologic/Lymphatic Complaints and Symptoms: Negative for: Bleeding / Clotting Disorders; Human Immunodeficiency Virus Medical History: Negative for: Anemia; Hemophilia; Human Immunodeficiency Virus; Lymphedema; Sickle Cell Disease Respiratory Complaints and Symptoms: Negative for: Chronic or frequent coughs; Shortness of Breath Medical History: Negative for: Aspiration; Asthma; Chronic Obstructive Pulmonary Disease (COPD); Pneumothorax; Sleep Apnea; Tuberculosis Cardiovascular Complaints and Symptoms: Negative for: Chest pain; LE edema Medical History: Positive for: Congestive Heart  Failure; Deep Vein Thrombosis; Hypertension; Peripheral Venous Disease Negative for: Angina; Arrhythmia; Coronary Artery Disease; Hypotension; Myocardial Infarction; Peripheral Arterial Disease; Phlebitis; Vasculitis Gastrointestinal Complaints and Symptoms: Negative for: Frequent diarrhea; Nausea; Vomiting Zarr, Maitland ([id number]) Medical History: Negative for: Cirrhosis ; Colitis; Crohnos; Hepatitis A; Hepatitis B; Hepatitis  C Endocrine Complaints and Symptoms: Negative for: Hepatitis; Thyroid disease; Polydypsia (Excessive Thirst) Medical History: Positive for: Type II Diabetes Negative for: Type I Diabetes Treated with: Oral agents Genitourinary Complaints and Symptoms: Positive for: Kidney failure/ Dialysis Negative for: Incontinence/dribbling Medical History: 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 Immunological Complaints and Symptoms: Negative for: Hives; Itching Medical History: Negative for: Lupus Erythematosus; Raynaudos; Scleroderma Integumentary (Skin) Complaints and Symptoms: Positive for: Wounds Negative for: Bleeding or bruising tendency; Breakdown; Swelling Medical History: Positive for: History of pressure wounds Negative for: History of Burn Musculoskeletal Complaints and Symptoms: Negative for: Muscle Pain; Muscle Weakness Medical History: Positive for: Gout Negative for: Rheumatoid Arthritis; Osteoarthritis; Osteomyelitis Neurologic Complaints and Symptoms: Positive for: Numbness/parasthesias - right hand Negative for: Focal/Weakness Medical History: Positive for: Neuropathy - right hand Amick, Ashtan ([id number]) Negative for: Dementia; Quadriplegia; Paraplegia; Seizure Disorder Psychiatric Complaints and Symptoms: Negative for: Anxiety; Claustrophobia Medical History: Negative for: Anorexia/bulimia; Confinement Anxiety Oncologic Medical History: Negative for: Received Chemotherapy; Received Radiation HBO Extended History Items Eyes: Cataracts Immunizations Pneumococcal Vaccine: Received Pneumococcal Vaccination: Yes Implantable Devices None Family and Social History Cancer: No; Diabetes: Yes - Siblings,Mother; Heart Disease: No; Hypertension: Yes - Siblings; Kidney Disease: Yes - Siblings,Mother; Lung Disease: No; Seizures: No; Stroke: No; Thyroid Problems: No; Tuberculosis: No; Former smoker; Marital Status - Divorced; Alcohol Use: Never; Drug  Use: No History; Caffeine Use: Rarely Electronic Signature(s) Signed: 10/11/2018 4:45:05 PM By: Montey Hora Signed: 10/12/2018 4:38:37 PM By: Worthy Keeler PA-C Entered By: Montey Hora on 10/11/2018 10:29:34 [name] ([id number]) -------------------------------------------------------------------------------- SuperBill Details Patient Name: [name] Date of Service: 10/11/2018 Medical Record Number: [id number] Patient Account Number: [id number] Date of Birth/Sex: 1951-04-12 (68 y.o. 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 N18.6 End stage renal disease Z99.2 Dependence on renal dialysis I73.89 Other specified peripheral vascular diseases Z79.01 Long term (current) use of anticoagulants Facility Procedures CPT4 Code Description: 09381829 99213 - WOUND CARE VISIT-LEV 3 EST PT Modifier: Quantity: 1 CPT4 Code Description: [id number] 11042 - DEB SUBQ TISSUE 20 SQ CM/< ICD-10 Diagnosis Description L97.413 Non-pressure chronic ulcer of right heel and midfoot with necro Modifier: sis of muscle Quantity: 1 CPT4 Code Description: 89381017 11043 - DEB MUSC/FASCIA 20 SQ CM/< ICD-10 Diagnosis Description L97.813 Non-pressure chronic ulcer of other part of right lower leg wit Modifier: h necrosis of mu Quantity: 1 scle CPT4 Code Description: [id number] 11046 - DEB MUSC/FASCIA EA ADDL 20 CM ICD-10 Diagnosis Description L97.813 Non-pressure chronic ulcer of other part of right lower leg wit Modifier: h necrosis of mu Quantity: 12 scle CPT4 Code Description: [id number]  17250 - CHEM CAUT GRANULATION TISS ICD-10 Diagnosis Description L97.813 Non-pressure chronic ulcer of other part of right lower leg wit Modifier: h necrosis of mu Quantity: 1 scle Physician Procedures CPT4 Code Description: 3614431 99214 - WC PHYS LEVEL 4 -  EST PT ICD-10 Diagnosis Description E11.622 Type 2 diabetes mellitus with other skin ulcer L97.813 Non-pressure chronic ulcer of other part of right lower leg with L97.413 Non-pressure chronic  ulcer of right heel and midfoot with necros N18.6 End stage renal disease ROMANI, WILBON ([id number]) Modifier: 25 necrosis of mu is of muscle Quantity: 1 scle Electronic Signature(s) Signed: 10/12/2018 4:38:37 PM By: Worthy Keeler PA-C Entered By: Worthy Keeler on 10/11/2018 15:07:37

## 2018-10-18 ENCOUNTER — Other Ambulatory Visit: Payer: Self-pay

## 2018-10-18 ENCOUNTER — Encounter: Payer: Medicare Other | Admitting: Physician Assistant

## 2018-10-18 DIAGNOSIS — E11622 Type 2 diabetes mellitus with other skin ulcer: Secondary | ICD-10-CM | POA: Diagnosis not present

## 2018-10-22 ENCOUNTER — Inpatient Hospital Stay: Payer: Self-pay

## 2018-10-22 ENCOUNTER — Emergency Department: Payer: Medicare Other

## 2018-10-22 ENCOUNTER — Encounter: Payer: Self-pay | Admitting: Emergency Medicine

## 2018-10-22 ENCOUNTER — Other Ambulatory Visit: Payer: Self-pay

## 2018-10-22 ENCOUNTER — Inpatient Hospital Stay
Admission: EM | Admit: 2018-10-22 | Discharge: 2018-10-30 | DRG: 239 | Disposition: A | Discharge status: Skilled Nursing Facility | Payer: Medicare Other | Attending: Internal Medicine | Admitting: Internal Medicine

## 2018-10-22 DIAGNOSIS — I120 Hypertensive chronic kidney disease with stage 5 chronic kidney disease or end stage renal disease: Secondary | ICD-10-CM | POA: Diagnosis not present

## 2018-10-22 DIAGNOSIS — I5032 Chronic diastolic (congestive) heart failure: Secondary | ICD-10-CM | POA: Diagnosis present

## 2018-10-22 DIAGNOSIS — Z992 Dependence on renal dialysis: Secondary | ICD-10-CM

## 2018-10-22 DIAGNOSIS — I132 Hypertensive heart and chronic kidney disease with heart failure and with stage 5 chronic kidney disease, or end stage renal disease: Secondary | ICD-10-CM | POA: Diagnosis present

## 2018-10-22 DIAGNOSIS — Z79899 Other long term (current) drug therapy: Secondary | ICD-10-CM

## 2018-10-22 DIAGNOSIS — F419 Anxiety disorder, unspecified: Secondary | ICD-10-CM | POA: Diagnosis present

## 2018-10-22 DIAGNOSIS — E875 Hyperkalemia: Secondary | ICD-10-CM | POA: Diagnosis not present

## 2018-10-22 DIAGNOSIS — I252 Old myocardial infarction: Secondary | ICD-10-CM

## 2018-10-22 DIAGNOSIS — E785 Hyperlipidemia, unspecified: Secondary | ICD-10-CM

## 2018-10-22 DIAGNOSIS — T82318A Breakdown (mechanical) of other vascular grafts, initial encounter: Secondary | ICD-10-CM | POA: Diagnosis present

## 2018-10-22 DIAGNOSIS — Z79891 Long term (current) use of opiate analgesic: Secondary | ICD-10-CM

## 2018-10-22 DIAGNOSIS — Z8249 Family history of ischemic heart disease and other diseases of the circulatory system: Secondary | ICD-10-CM | POA: Diagnosis not present

## 2018-10-22 DIAGNOSIS — Z91013 Allergy to seafood: Secondary | ICD-10-CM | POA: Diagnosis not present

## 2018-10-22 DIAGNOSIS — L97912 Non-pressure chronic ulcer of unspecified part of right lower leg with fat layer exposed: Secondary | ICD-10-CM | POA: Diagnosis present

## 2018-10-22 DIAGNOSIS — E1122 Type 2 diabetes mellitus with diabetic chronic kidney disease: Secondary | ICD-10-CM | POA: Diagnosis present

## 2018-10-22 DIAGNOSIS — D72829 Elevated white blood cell count, unspecified: Secondary | ICD-10-CM

## 2018-10-22 DIAGNOSIS — N179 Acute kidney failure, unspecified: Secondary | ICD-10-CM | POA: Diagnosis not present

## 2018-10-22 DIAGNOSIS — Z7902 Long term (current) use of antithrombotics/antiplatelets: Secondary | ICD-10-CM | POA: Diagnosis not present

## 2018-10-22 DIAGNOSIS — S81801A Unspecified open wound, right lower leg, initial encounter: Secondary | ICD-10-CM

## 2018-10-22 DIAGNOSIS — D631 Anemia in chronic kidney disease: Secondary | ICD-10-CM | POA: Diagnosis present

## 2018-10-22 DIAGNOSIS — Z7901 Long term (current) use of anticoagulants: Secondary | ICD-10-CM

## 2018-10-22 DIAGNOSIS — M79641 Pain in right hand: Secondary | ICD-10-CM

## 2018-10-22 DIAGNOSIS — Z87891 Personal history of nicotine dependence: Secondary | ICD-10-CM | POA: Diagnosis not present

## 2018-10-22 DIAGNOSIS — R609 Edema, unspecified: Secondary | ICD-10-CM

## 2018-10-22 DIAGNOSIS — M79601 Pain in right arm: Secondary | ICD-10-CM | POA: Diagnosis not present

## 2018-10-22 DIAGNOSIS — T82898A Other specified complication of vascular prosthetic devices, implants and grafts, initial encounter: Secondary | ICD-10-CM | POA: Diagnosis not present

## 2018-10-22 DIAGNOSIS — N2581 Secondary hyperparathyroidism of renal origin: Secondary | ICD-10-CM | POA: Diagnosis present

## 2018-10-22 DIAGNOSIS — L03115 Cellulitis of right lower limb: Secondary | ICD-10-CM | POA: Diagnosis present

## 2018-10-22 DIAGNOSIS — I871 Compression of vein: Secondary | ICD-10-CM | POA: Diagnosis not present

## 2018-10-22 DIAGNOSIS — Z1159 Encounter for screening for other viral diseases: Secondary | ICD-10-CM

## 2018-10-22 DIAGNOSIS — I739 Peripheral vascular disease, unspecified: Secondary | ICD-10-CM

## 2018-10-22 DIAGNOSIS — M7989 Other specified soft tissue disorders: Secondary | ICD-10-CM | POA: Diagnosis present

## 2018-10-22 DIAGNOSIS — Z833 Family history of diabetes mellitus: Secondary | ICD-10-CM | POA: Diagnosis not present

## 2018-10-22 DIAGNOSIS — R200 Anesthesia of skin: Secondary | ICD-10-CM

## 2018-10-22 DIAGNOSIS — N186 End stage renal disease: Secondary | ICD-10-CM | POA: Diagnosis present

## 2018-10-22 DIAGNOSIS — E1151 Type 2 diabetes mellitus with diabetic peripheral angiopathy without gangrene: Secondary | ICD-10-CM | POA: Diagnosis present

## 2018-10-22 DIAGNOSIS — R600 Localized edema: Secondary | ICD-10-CM

## 2018-10-22 LAB — CBC WITH DIFFERENTIAL/PLATELET
Abs Immature Granulocytes: 0.1 10*3/uL — ABNORMAL HIGH (ref 0.00–0.07)
Basophils Absolute: 0.1 10*3/uL (ref 0.0–0.1)
Basophils Relative: 1 %
Eosinophils Absolute: 0.3 10*3/uL (ref 0.0–0.5)
Eosinophils Relative: 2 %
HCT: 29.8 % — ABNORMAL LOW (ref 39.0–52.0)
Hemoglobin: 8.9 g/dL — ABNORMAL LOW (ref 13.0–17.0)
Immature Granulocytes: 1 %
Lymphocytes Relative: 8 %
Lymphs Abs: 1.4 10*3/uL (ref 0.7–4.0)
MCH: 26.9 pg (ref 26.0–34.0)
MCHC: 29.9 g/dL — ABNORMAL LOW (ref 30.0–36.0)
MCV: 90 fL (ref 80.0–100.0)
Monocytes Absolute: 1.6 10*3/uL — ABNORMAL HIGH (ref 0.1–1.0)
Monocytes Relative: 9 %
Neutro Abs: 13.8 10*3/uL — ABNORMAL HIGH (ref 1.7–7.7)
Neutrophils Relative %: 79 %
Platelets: 249 10*3/uL (ref 150–400)
RBC: 3.31 MIL/uL — ABNORMAL LOW (ref 4.22–5.81)
RDW: 15.4 % (ref 11.5–15.5)
WBC: 17.2 10*3/uL — ABNORMAL HIGH (ref 4.0–10.5)
nRBC: 0 % (ref 0.0–0.2)

## 2018-10-22 LAB — LACTIC ACID, PLASMA
Lactic Acid, Venous: 1.1 mmol/L (ref 0.5–1.9)
Lactic Acid, Venous: 1.4 mmol/L (ref 0.5–1.9)

## 2018-10-22 LAB — COMPREHENSIVE METABOLIC PANEL
ALT: 7 U/L (ref 0–44)
AST: 14 U/L — ABNORMAL LOW (ref 15–41)
Albumin: 2.7 g/dL — ABNORMAL LOW (ref 3.5–5.0)
Alkaline Phosphatase: 73 U/L (ref 38–126)
Anion gap: 16 — ABNORMAL HIGH (ref 5–15)
BUN: 40 mg/dL — ABNORMAL HIGH (ref 8–23)
CO2: 24 mmol/L (ref 22–32)
Calcium: 7.9 mg/dL — ABNORMAL LOW (ref 8.9–10.3)
Chloride: 99 mmol/L (ref 98–111)
Creatinine, Ser: 6.62 mg/dL — ABNORMAL HIGH (ref 0.61–1.24)
GFR calc Af Amer: 9 mL/min — ABNORMAL LOW (ref >60)
GFR calc non Af Amer: 8 mL/min — ABNORMAL LOW (ref >60)
Glucose, Bld: 85 mg/dL (ref 70–99)
Potassium: 4.9 mmol/L (ref 3.5–5.1)
Sodium: 139 mmol/L (ref 135–145)
Total Bilirubin: 0.5 mg/dL (ref 0.3–1.2)
Total Protein: 6.8 g/dL (ref 6.5–8.1)

## 2018-10-22 LAB — MRSA PCR SCREENING: MRSA by PCR: NEGATIVE

## 2018-10-22 MED ORDER — ATORVASTATIN CALCIUM 10 MG PO TABS
10.0000 mg | ORAL_TABLET | Freq: Every day | ORAL | Status: DC
  Administered 2018-10-22 – 2018-10-30 (×9): 10 mg via ORAL
  Filled 2018-10-22 (×10): qty 1

## 2018-10-22 MED ORDER — ONDANSETRON HCL 4 MG/2ML IJ SOLN
4.0000 mg | Freq: Four times a day (QID) | INTRAMUSCULAR | Status: DC | PRN
  Administered 2018-10-29: 4 mg via INTRAVENOUS
  Filled 2018-10-22: qty 2

## 2018-10-22 MED ORDER — SEVELAMER CARBONATE 800 MG PO TABS
2400.0000 mg | ORAL_TABLET | Freq: Three times a day (TID) | ORAL | Status: DC
  Administered 2018-10-23 – 2018-10-29 (×9): 2400 mg via ORAL
  Filled 2018-10-22 (×9): qty 3

## 2018-10-22 MED ORDER — MORPHINE SULFATE (PF) 4 MG/ML IV SOLN
4.0000 mg | Freq: Once | INTRAVENOUS | Status: AC
  Administered 2018-10-22: 4 mg via INTRAMUSCULAR
  Filled 2018-10-22: qty 1

## 2018-10-22 MED ORDER — POLYETHYLENE GLYCOL 3350 17 G PO PACK: 17.0000 g | PACK | Freq: Every day | ORAL | Status: DC | PRN

## 2018-10-22 MED ORDER — GABAPENTIN 100 MG PO CAPS
100.0000 mg | ORAL_CAPSULE | Freq: Two times a day (BID) | ORAL | Status: DC
  Administered 2018-10-22 – 2018-10-30 (×16): 100 mg via ORAL
  Filled 2018-10-22 (×16): qty 1

## 2018-10-22 MED ORDER — HYDROCODONE-ACETAMINOPHEN 5-325 MG PO TABS
1.0000 | ORAL_TABLET | Freq: Four times a day (QID) | ORAL | Status: DC | PRN
  Administered 2018-10-22 – 2018-10-30 (×16): 1 via ORAL
  Filled 2018-10-22 (×16): qty 1

## 2018-10-22 MED ORDER — SODIUM CHLORIDE 0.9 % IV SOLN
2.0000 g | Freq: Once | INTRAVENOUS | Status: DC
  Filled 2018-10-22: qty 2

## 2018-10-22 MED ORDER — LOSARTAN POTASSIUM 50 MG PO TABS
100.0000 mg | ORAL_TABLET | Freq: Every day | ORAL | Status: DC
  Administered 2018-10-22 – 2018-10-30 (×7): 100 mg via ORAL
  Filled 2018-10-22 (×8): qty 2

## 2018-10-22 MED ORDER — HYDRALAZINE HCL 20 MG/ML IJ SOLN: 10.0000 mg | Freq: Four times a day (QID) | INTRAMUSCULAR | Status: DC | PRN

## 2018-10-22 MED ORDER — ACETAMINOPHEN 650 MG RE SUPP: 650.0000 mg | Freq: Four times a day (QID) | RECTAL | Status: DC | PRN

## 2018-10-22 MED ORDER — MORPHINE SULFATE (PF) 2 MG/ML IV SOLN: 2.0000 mg | Freq: Once | INTRAVENOUS | Status: DC

## 2018-10-22 MED ORDER — ALBUTEROL SULFATE (2.5 MG/3ML) 0.083% IN NEBU: 2.5000 mg | INHALATION_SOLUTION | RESPIRATORY_TRACT | Status: DC | PRN

## 2018-10-22 MED ORDER — HEPARIN SODIUM (PORCINE) 5000 UNIT/ML IJ SOLN
5000.0000 [IU] | Freq: Three times a day (TID) | INTRAMUSCULAR | Status: DC
  Administered 2018-10-22 – 2018-10-30 (×19): 5000 [IU] via SUBCUTANEOUS
  Filled 2018-10-22 (×19): qty 1

## 2018-10-22 MED ORDER — ONDANSETRON HCL 4 MG PO TABS: 4.0000 mg | ORAL_TABLET | Freq: Four times a day (QID) | ORAL | Status: DC | PRN

## 2018-10-22 MED ORDER — METOPROLOL TARTRATE 25 MG PO TABS
25.0000 mg | ORAL_TABLET | Freq: Two times a day (BID) | ORAL | Status: DC
  Administered 2018-10-22 – 2018-10-30 (×14): 25 mg via ORAL
  Filled 2018-10-22 (×15): qty 1

## 2018-10-22 MED ORDER — ALLOPURINOL 100 MG PO TABS
100.0000 mg | ORAL_TABLET | Freq: Every day | ORAL | Status: DC
  Administered 2018-10-22 – 2018-10-30 (×9): 100 mg via ORAL
  Filled 2018-10-22 (×9): qty 1

## 2018-10-22 MED ORDER — ACETAMINOPHEN 325 MG PO TABS: 650.0000 mg | ORAL_TABLET | Freq: Four times a day (QID) | ORAL | Status: DC | PRN

## 2018-10-22 MED ORDER — CLOPIDOGREL BISULFATE 75 MG PO TABS
75.0000 mg | ORAL_TABLET | Freq: Every day | ORAL | Status: DC
  Administered 2018-10-22 – 2018-10-30 (×9): 75 mg via ORAL
  Filled 2018-10-22 (×9): qty 1

## 2018-10-22 MED ORDER — CLONAZEPAM 0.5 MG PO TABS
0.2500 mg | ORAL_TABLET | Freq: Two times a day (BID) | ORAL | Status: DC | PRN
  Administered 2018-10-24: 0.25 mg via ORAL
  Filled 2018-10-22 (×3): qty 1

## 2018-10-22 MED ORDER — VANCOMYCIN HCL 10 G IV SOLR
1500.0000 mg | Freq: Once | INTRAVENOUS | Status: DC
  Filled 2018-10-22: qty 1500

## 2018-10-22 NOTE — ED Provider Notes (Addendum)
[facility] Emergency Department Provider Note   ____________________________________________   First MD Initiated Contact with Patient 10/22/18 1440     (approximate)  I have reviewed the triage vital signs and the nursing notes.   HISTORY  Chief Complaint Wound Infection    HPI Johnny [name]. is a 68 y.o. male history of end-stage renal disease on dialysis, completed his HD today he reports  He came at the advice of his physician as he has a worsening pain and redness around a nonhealing right lower extremity wound.  He is also noticed his right upper hand has been swollen around his fistula slight into his fingers for about 5 days.  Sent for further evaluation, he tells me his doctor thinks that he needs to be on antibiotics.  He sees Dr. Lucky Cowboy a vascular surgery   Reports achy pain over the right calf, worsening over the last 4 to 5 days.  No cold or blue foot.  No cold or blue right hand  Reports pain is moderate achy.  Right lower leg.  Past Medical History:  Diagnosis Date  . Anemia   . Anxiety   . CHF (congestive heart failure) ([place])   . Chronic kidney disease    esrd dialysis m/w/f  . Gout   . Hyperlipidemia   . Hypertension   . Myocardial infarction ([place]) 2010   10 years ago  . 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  . Peripheral vascular disease [facility])     Patient Active Problem List   Diagnosis Date Noted  . Cellulitis of right leg 07/21/2018  . Lower limb ulcer, calf, right, limited to breakdown of skin ([place]) 06/08/2018  . Malnutrition of moderate degree 04/11/2018  . Pressure injury of skin 04/06/2018  . Altered mental status 04/04/2018  . Hypothermia 04/04/2018  . Hyperlipidemia 02/27/2018  . Diabetes ([place]) 02/27/2018  . Weakness of right lower extremity 01/20/2018  . Fever   . Periumbilical abdominal pain   . Confusion 12/22/2017  . Acute delirium 12/21/2017  . Protein-calorie  malnutrition, severe 12/19/2017  . Intractable nausea and vomiting 12/18/2017  . Lymphedema 12/13/2017  . Cellulitis 11/27/2017  . Chest pain 11/19/2017  . Atherosclerosis of native arteries of the extremities with ulceration ([place]) 11/07/2017  . Elevated troponin 10/02/2015  . Complications, dialysis, catheter, mechanical ([place]) 10/02/2015  . Musculoskeletal chest pain 09/28/2015  . ESRD on dialysis ([place]) 09/28/2015  . HTN (hypertension) 09/28/2015  . Chronic diastolic CHF (congestive heart failure) ([place]) 09/28/2015  . Gout 09/28/2015    Past Surgical History:  Procedure Laterality Date  . A/V FISTULAGRAM Right 09/06/2018   Procedure: A/V FISTULAGRAM;  Surgeon: Algernon Huxley, MD;  Location: [place] [facility];  Service: Cardiovascular;  Laterality: Right;  . A/V SHUNTOGRAM Left 06/21/2017   Procedure: A/V SHUNTOGRAM;  Surgeon: Katha Cabal, MD;  Location: [place] [facility];  Service: Cardiovascular;  Laterality: Left;  . APPLICATION OF WOUND VAC Right 04/11/2018   Procedure: APPLICATION OF WOUND VAC;  Surgeon: Algernon Huxley, MD;  Location: [facility];  Service: Vascular;  Laterality: Right;  . AV FISTULA PLACEMENT Left 09/18/2015   Procedure: INSERTION OF ARTERIOVENOUS (AV) GORE-TEX GRAFT ARM ( BRACH/AXILLARY GRAFT W/ INSTANT STICK GRAFT );  Surgeon: Katha Cabal, MD;  Location: [facility];  Service: Vascular;  Laterality: Left;  . AV FISTULA PLACEMENT Right 07/19/2018   Procedure: INSERTION OF ARTERIOVENOUS (AV) GORE-TEX GRAFT ARM ( BRACHIAL AXILLARY);  Surgeon: Algernon Huxley, MD;  Location: [facility];  Service: Vascular;  Laterality: Right;  . DIALYSIS FISTULA CREATION Right 10/2017   right chest perm cath  . DIALYSIS/PERMA CATHETER REMOVAL N/A 09/13/2018   Procedure: DIALYSIS/PERMA CATHETER REMOVAL;  Surgeon: Algernon Huxley, MD;  Location: [place] [facility];  Service: Cardiovascular;  Laterality: N/A;  . ESOPHAGOGASTRODUODENOSCOPY N/A 12/19/2017   Procedure:  ESOPHAGOGASTRODUODENOSCOPY (EGD);  Surgeon: Lin Landsman, MD;  Location: [place] [facility];  Service: Gastroenterology;  Laterality: N/A;  . LOWER EXTREMITY ANGIOGRAPHY Left 11/16/2017   Procedure: LOWER EXTREMITY ANGIOGRAPHY;  Surgeon: Algernon Huxley, MD;  Location: [place] [facility];  Service: Cardiovascular;  Laterality: Left;  . LOWER EXTREMITY ANGIOGRAPHY Right 01/18/2018   Procedure: LOWER EXTREMITY ANGIOGRAPHY;  Surgeon: Algernon Huxley, MD;  Location: [place] [facility];  Service: Cardiovascular;  Laterality: Right;  . LOWER EXTREMITY ANGIOGRAPHY Left 04/02/2018   Procedure: LOWER EXTREMITY ANGIOGRAPHY;  Surgeon: Algernon Huxley, MD;  Location: [place] [facility];  Service: Cardiovascular;  Laterality: Left;  . LOWER EXTREMITY ANGIOGRAPHY Right 04/09/2018   Procedure: Lower Extremity Angiography with possible intervention;  Surgeon: Algernon Huxley, MD;  Location: [place] [facility];  Service: Cardiovascular;  Laterality: Right;  . LOWER EXTREMITY ANGIOGRAPHY Right 07/23/2018   Procedure: Lower Extremity Angiography;  Surgeon: Algernon Huxley, MD;  Location: [place] [facility];  Service: Cardiovascular;  Laterality: Right;  . LOWER EXTREMITY ANGIOGRAPHY Right 09/13/2018   Procedure: LOWER EXTREMITY ANGIOGRAPHY;  Surgeon: Algernon Huxley, MD;  Location: [place] [facility];  Service: Cardiovascular;  Laterality: Right;  . LOWER EXTREMITY VENOGRAPHY Right 09/13/2018   Procedure: LOWER EXTREMITY VENOGRAPHY;  Surgeon: Algernon Huxley, MD;  Location: [place] [facility];  Service: Cardiovascular;  Laterality: Right;  . PERIPHERAL VASCULAR CATHETERIZATION Left 09/01/2015   Procedure: A/V Shuntogram/Fistulagram;  Surgeon: Katha Cabal, MD;  Location: [place] [facility];  Service: Cardiovascular;  Laterality: Left;  . PERIPHERAL VASCULAR CATHETERIZATION N/A 09/30/2015   Procedure: A/V Shuntogram/Fistulagram with perm cathether removal;  Surgeon: Algernon Huxley, MD;  Location: [place] [facility];  Service:  Cardiovascular;  Laterality: N/A;  . PERIPHERAL VASCULAR CATHETERIZATION Left 09/30/2015   Procedure: A/V Shunt Intervention;  Surgeon: Algernon Huxley, MD;  Location: [place] [facility];  Service: Cardiovascular;  Laterality: Left;  . PERIPHERAL VASCULAR CATHETERIZATION Left 12/03/2015   Procedure: Thrombectomy;  Surgeon: Algernon Huxley, MD;  Location: [place] [facility];  Service: Cardiovascular;  Laterality: Left;  . PERIPHERAL VASCULAR CATHETERIZATION Left 01/28/2016   Procedure: Thrombectomy;  Surgeon: Algernon Huxley, MD;  Location: [place] [facility];  Service: Cardiovascular;  Laterality: Left;  . PERIPHERAL VASCULAR CATHETERIZATION N/A 01/28/2016   Procedure: A/V Shuntogram/Fistulagram;  Surgeon: Algernon Huxley, MD;  Location: [place] [facility];  Service: Cardiovascular;  Laterality: N/A;  . SKIN SPLIT GRAFT Right 05/24/2018   Procedure: SKIN GRAFT SPLIT THICKNESS ( RIGHT CALF);  Surgeon: Algernon Huxley, MD;  Location: [facility];  Service: Vascular;  Laterality: Right;  . WOUND DEBRIDEMENT Right 04/11/2018   Procedure: DEBRIDEMENT WOUND calf muscle and skin;  Surgeon: Algernon Huxley, MD;  Location: [facility];  Service: Vascular;  Laterality: Right;    Prior to Admission medications   Medication Sig Start Date End Date Taking? Authorizing Provider  allopurinol (ZYLOPRIM) 100 MG tablet Take 100 mg by mouth daily.    [provider]  apixaban (ELIQUIS) 2.5 MG TABS tablet Take 1 tablet (  2.5 mg total) by mouth 2 (two) times daily. 07/26/18   Bettey Costa, MD  atorvastatin (LIPITOR) 10 MG tablet Take 1 tablet by mouth daily. 11/23/17   [provider]  calcitRIOL (ROCALTROL) 0.5 MCG capsule Take 0.5 mcg by mouth daily.    [provider]  calcium acetate (PHOSLO) 667 MG capsule Take 1,334 mg by mouth 3 (three) times daily with meals.    [provider]  cephALEXin (KEFLEX) 500 MG capsule  09/11/18   [provider]  ciprofloxacin (CIPRO) 500 MG tablet  08/29/18    [provider]  clonazePAM (KLONOPIN) 0.5 MG tablet Take 1 tablet (0.5 mg total) by mouth as needed. 07/26/18   Bettey Costa, MD  clopidogrel (PLAVIX) 75 MG tablet Take 1 tablet (75 mg total) by mouth daily. 04/02/18   Algernon Huxley, MD  folic acid-vitamin b complex-vitamin c-selenium-zinc (DIALYVITE) 3 MG TABS tablet Take 1 tablet by mouth daily.     [provider]  gabapentin (NEURONTIN) 100 MG capsule Take 1 capsule (100 mg total) by mouth at bedtime. 07/26/18   Bettey Costa, MD  HYDROcodone-acetaminophen (NORCO) 5-325 MG tablet Take 1 tablet by mouth every 6 (six) hours as needed for moderate pain. 09/13/18   Stegmayer, Joelene Millin A, PA-C  metoprolol succinate (TOPROL-XL) 25 MG 24 hr tablet Take 25 mg by mouth at bedtime. 03/02/18   [provider]  sevelamer carbonate (RENVELA) 800 MG tablet  09/20/18   [provider]    Allergies Shellfish allergy  Family History  Problem Relation Age of Onset  . Hypertension Other   . Heart disease Other   . Diabetes Mother     Social History Social History   Tobacco Use  . Smoking status: Former Smoker    Types: Cigarettes    Quit date: 05/17/2005    Years since quitting: 13.4  . Smokeless tobacco: Never Used  Substance Use Topics  . Alcohol use: No  . Drug use: No    Review of Systems Constitutional: No fever/chills Eyes: No visual changes. ENT: No sore throat. Cardiovascular: Denies chest pain. Respiratory: Denies shortness of breath. Gastrointestinal: No abdominal pain.   Genitourinary: Negative for dysuria. Musculoskeletal: See HPI Skin: Negative for rash. Neurological: Negative for headaches, areas of focal weakness or numbness.    ____________________________________________   PHYSICAL EXAM:  VITAL SIGNS: ED Triage Vitals  Enc Vitals Group     BP 10/22/18 1417 (!) 163/78     Pulse Rate 10/22/18 1417 (!) 106     Resp 10/22/18 1417 20     Temp 10/22/18 1417 98.3 F (36.8 C)     Temp  Source 10/22/18 1417 Oral     SpO2 10/22/18 1417 98 %     Weight 10/22/18 1419 152 lb (68.9 kg)     Height 10/22/18 1419 5\' 8"  (1.727 m)     Head Circumference --      Peak Flow --      Pain Score 10/22/18 1419 5     Pain Loc --      Pain Edu? --      Excl. in [place]? --     Constitutional: Alert and oriented. Well appearing and in no acute distress.  Very pleasant.  Reports pain in the right calf when he moves it. Eyes: Conjunctivae are normal. Head: Atraumatic. Nose: No congestion/rhinnorhea. Mouth/Throat: Mucous membranes are moist. Neck: No stridor.  Cardiovascular: Normal rate, regular rhythm. Grossly normal heart sounds.  Good peripheral circulation.  Respiratory: Normal respiratory effort.  No retractions. Lungs CTAB. Gastrointestinal: Soft and nontender. No distention. Musculoskeletal: No lower extremity tenderness left lower extremity.  Venous stasis wounds bilateral.  Right lower extremity demonstrates a necrotic appearing open wound that appears to be chronic and very poorly healing with some erythema and warmth at the edges over the right posterior gastrocnemius region.  Left upper extremity normal.  Right upper extremity some moderate edema over the fingers right upper forearm without erythema or redness or warmth.  Strong radial pulse. Neurologic:  Normal speech and language. No gross focal neurologic deficits are appreciated.  Skin:  Skin is warm, dry and intact. No rash noted. Psychiatric: Mood and affect are normal. Speech and behavior are normal.  ____________________________________________   LABS (all labs ordered are listed, but only abnormal results are displayed)  Labs Reviewed  CULTURE, BLOOD (ROUTINE X 2)  CULTURE, BLOOD (ROUTINE X 2)  CBC WITH DIFFERENTIAL/PLATELET  LACTIC ACID, PLASMA  LACTIC ACID, PLASMA  CBC WITH DIFFERENTIAL/PLATELET  PROTIME-INR  BASIC METABOLIC PANEL   ____________________________________________  EKG  Reviewed entered by me at  1700 Heart rate 110 QRS 110 QTc 480 Sinus tachycardia, probable left ventricular hypertrophy.  No evidence of acute ischemia ____________________________________________  RADIOLOGY  US Venous Img Lower Unilateral Right  Result Date: 10/22/2018 CLINICAL DATA:  68 year old male with a history EXAM: RIGHT LOWER EXTREMITY VENOUS DOPPLER ULTRASOUND TECHNIQUE: Gray-scale sonography with graded compression, as well as color Doppler and duplex ultrasound were performed to evaluate the lower extremity deep venous systems from the level of the common femoral vein and including the common femoral, femoral, profunda femoral, popliteal and calf veins including the posterior tibial, peroneal and gastrocnemius veins when visible. The superficial great saphenous vein was also interrogated. Spectral Doppler was utilized to evaluate flow at rest and with distal augmentation maneuvers in the common femoral, femoral and popliteal veins. COMPARISON:  None. FINDINGS: Contralateral Common Femoral Vein: Respiratory phasicity is normal and symmetric with the symptomatic side. No evidence of thrombus. Normal compressibility. Common Femoral Vein: No evidence of thrombus. Normal compressibility, respiratory phasicity and response to augmentation. Saphenofemoral Junction: No evidence of thrombus. Normal compressibility and flow on color Doppler imaging. Profunda Femoral Vein: No evidence of thrombus. Normal compressibility and flow on color Doppler imaging. Femoral Vein: No evidence of thrombus. Normal compressibility, respiratory phasicity and response to augmentation. Popliteal Vein: No evidence of thrombus. Normal compressibility, respiratory phasicity and response to augmentation. Calf Veins: Posterior tibial vein and peroneal vein not well visualized. Superficial Great Saphenous Vein: No evidence of thrombus. Normal compressibility and flow on color Doppler imaging. Other Findings: Edema. Lymph nodes of the inguinal region with  typical architecture maintained, borderline enlarged. Vascular atherosclerosis incidentally imaged. IMPRESSION: Sonographic survey of the right lower extremity negative for DVT. Edema. Lymph nodes of the inguinal region, most likely reactive. Electronically Signed   By: Corrie Mckusick D.O.   On: 10/22/2018 16:30   US Venous Img Upper Uni Right  Result Date: 10/22/2018 CLINICAL DATA:  Right upper extremity pain and edema for the past 4-5 days. History upper extremity dialysis AV fistula. EXAM: RIGHT UPPER EXTREMITY VENOUS DOPPLER ULTRASOUND TECHNIQUE: Gray-scale sonography with graded compression, as well as color Doppler and duplex ultrasound were performed to evaluate the upper extremity deep venous system from the level of the subclavian vein and including the jugular, axillary, basilic, radial, ulnar and upper cephalic vein. Spectral Doppler was utilized to evaluate flow at rest and with distal augmentation maneuvers. COMPARISON:  None.  FINDINGS: Contralateral Subclavian Vein: Respiratory phasicity is normal and symmetric with the symptomatic side. No evidence of thrombus. Normal compressibility. Internal Jugular Vein: No evidence of thrombus. Normal compressibility, respiratory phasicity and response to augmentation. Subclavian Vein: No evidence of thrombus. Normal compressibility, respiratory phasicity and response to augmentation. Axillary Vein: No evidence of thrombus. Normal compressibility, respiratory phasicity and response to augmentation. Cephalic Vein: No evidence of thrombus. Normal compressibility, respiratory phasicity and response to augmentation. Basilic Vein: No evidence of thrombus. Normal compressibility, respiratory phasicity and response to augmentation. Brachial Veins: No evidence of thrombus. Normal compressibility, respiratory phasicity and response to augmentation. Radial Veins: No evidence of thrombus. Normal compressibility, respiratory phasicity and response to augmentation. Ulnar  Veins: No evidence of thrombus. Normal compressibility, respiratory phasicity and response to augmentation. Venous Reflux:  None visualized. Other Findings:  None visualized. IMPRESSION: No evidence of DVT within the right upper extremity. Electronically Signed   By: Sandi Mariscal M.D.   On: 10/22/2018 16:29   [name] Ekg Site Rite  Result Date: 10/22/2018 If Site Rite image not attached, placement could not be confirmed due to current cardiac rhythm.   ____________________________________________   PROCEDURES  Procedure(s) performed: None  Procedures  Critical Care performed: No  ____________________________________________   INITIAL IMPRESSION / ASSESSMENT AND PLAN / ED COURSE  Pertinent labs & imaging results that were available during my care of the patient were reviewed by me and considered in my medical decision making (see chart for details).   Case and care discussed with Dr. Corene Cornea dew vascular, he recommends initiate antibiotic and the patient needs admission and consultation with his service as there is a high likelihood the patient will require a possible amputation  New order ultrasound to evaluate for DVT as to cause for swelling in right arm right leg.  Vascular surgery consultation.  Initiate antibiotics, draw blood cultures.  Patient on hemodialysis today.  Tabias Swayze. was evaluated in Emergency Department on 10/22/2018 for the symptoms described in the history of present illness. He was evaluated in the context of the global COVID-19 pandemic, which necessitated consideration that the patient might be at risk for infection with the SARS-CoV-2 virus that causes COVID-19. Institutional protocols and algorithms that pertain to the evaluation of patients at risk for COVID-19 are in a state of rapid change based on information released by regulatory bodies including the CDC and federal and state organizations. These policies and algorithms were followed during the patient's  care in the ED.    Clinical Course as of Oct 22 2122  Mon Oct 22, 2018  1617 Unable to Doppler pulse in the right lower extremity, does have pulses present right femoral arterial region.  Have notified Dr. Lucky Cowboy   [MQ]  1635 Dr. Lucky Cowboy aware of no dopplerable RLE pulse and vascular surgery has already seen and evaluated the patient.  Patient pending admission, vascular following.   [MQ]    Clinical Course User Index [MQ] Delman Kitten, MD    Admitted to hospitalist service, vascular surgery has seen and consulting. ____________________________________________   FINAL CLINICAL IMPRESSION(S) / ED DIAGNOSES  Final diagnoses:  Swelling  Cellulitis of right lower leg  Edema of upper extremity        Note:  This document was prepared using Dragon voice recognition software and may include unintentional dictation errors       Delman Kitten, MD 10/22/18 2124    Delman Kitten, MD 10/29/18 1220    Delman Kitten, MD 11/04/18 [phone]  Delman Kitten, MD 11/15/18 1041

## 2018-10-22 NOTE — ED Notes (Signed)
IV team RN at bedside, states unable to find IV access. Attending MD Sudini notified, orders placed for PICC line placement.

## 2018-10-22 NOTE — Consult Note (Addendum)
[place] [facility] Consult Note  MRN : [id number]  Johnny [name]. is a 68 y.o. (1950/09/02) male who presents with chief complaint of  Chief Complaint  Patient presents with  . Wound Infection   History of Present Illness:  The patient is a 68 year old male well-known to our practice with a past medical history of hypertension, hyperlipidemia, diabetes, congestive heart failure, anemia, myocardial infarction, neuromuscular disorder, end-stage renal disease on chronic hemodialysis, peripheral vascular disease with slow healing right lower extremity wound status post debridement status post skin graft who presents to the [facility] emergency department with a chief complaint of a right lower extremity wound.  The patient is well-known to our practice and has undergone multiple endovascular and open procedures to both his right lower extremity wound as well his his right upper extremity graft.  Patient is unable to explain why he sought medical attention today in the emergency department.  He notes that he was told by his primary care doctor "Dr. Quay Burow" to come to the hospital.  Outside of that information he cannot elaborate any additional symptoms/reasons he sought medical attention.  He does note that his right upper extremity has been "swollen" for approximately 1 week.  He states that his right hand is painful and numb.  He notes that his dialysis access is functioning well.  He denies any ulcer formation to the right hand.  The patient has been receiving wound care services from [facility] wound care center.  From epic notation his last visit was on October 18, 2018.  He was last seen in our office on October 09, 2018.  Both visits note that his wound was showing signs of healing.  Vascular surgery was consulted by ER physician Dr. Jacqualine Code for further recommendations. Current Facility-Administered Medications  Medication Dose  Route Frequency Provider Last Rate Last Dose  . ceFEPIme (MAXIPIME) 2 g in sodium chloride 0.9 % 100 mL IVPB  2 g Intravenous Once Lu Duffel, RPH      . morphine 2 MG/ML injection 2 mg  2 mg Intravenous Once Delman Kitten, MD      . vancomycin (VANCOCIN) 1,500 mg in sodium chloride 0.9 % 500 mL IVPB  1,500 mg Intravenous Once Lu Duffel, [facility]       Current Outpatient Medications  Medication Sig Dispense Refill  . allopurinol (ZYLOPRIM) 100 MG tablet Take 100 mg by mouth daily.    [name] apixaban (ELIQUIS) 2.5 MG TABS tablet Take 1 tablet (2.5 mg total) by mouth 2 (two) times daily. 60 tablet 0  . atorvastatin (LIPITOR) 10 MG tablet Take 1 tablet by mouth daily.    . calcitRIOL (ROCALTROL) 0.5 MCG capsule Take 0.5 mcg by mouth daily.    . calcium acetate (PHOSLO) 667 MG capsule Take 1,334 mg by mouth 3 (three) times daily with meals.    . cephALEXin (KEFLEX) 500 MG capsule     . ciprofloxacin (CIPRO) 500 MG tablet     . clonazePAM (KLONOPIN) 0.5 MG tablet Take 1 tablet (0.5 mg total) by mouth as needed. 10 tablet 0  . clopidogrel (PLAVIX) 75 MG tablet Take 1 tablet (75 mg total) by mouth daily. 30 tablet 11  . folic acid-vitamin b complex-vitamin c-selenium-zinc (DIALYVITE) 3 MG TABS tablet Take 1 tablet by mouth daily.     [name] gabapentin (NEURONTIN) 100 MG capsule Take 1 capsule (100 mg total) by mouth at bedtime.    [name] HYDROcodone-acetaminophen ([place])  5-325 MG tablet Take 1 tablet by mouth every 6 (six) hours as needed for moderate pain. 25 tablet 0  . metoprolol succinate (TOPROL-XL) 25 MG 24 hr tablet Take 25 mg by mouth at bedtime.    . sevelamer carbonate (RENVELA) 800 MG tablet      Past Medical History:  Diagnosis Date  . Anemia   . Anxiety   . CHF (congestive heart failure) ([place])   . Chronic kidney disease    esrd dialysis m/w/f  . Gout   . Hyperlipidemia   . Hypertension   . Myocardial infarction ([place]) 2010   10 years ago  . 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  . Peripheral vascular disease [facility])    Past Surgical History:  Procedure Laterality Date  . A/V FISTULAGRAM Right 09/06/2018   Procedure: A/V FISTULAGRAM;  Surgeon: Algernon Huxley, MD;  Location: [place] [facility];  Service: Cardiovascular;  Laterality: Right;  . A/V SHUNTOGRAM Left 06/21/2017   Procedure: A/V SHUNTOGRAM;  Surgeon: Katha Cabal, MD;  Location: [place] [facility];  Service: Cardiovascular;  Laterality: Left;  . APPLICATION OF WOUND VAC Right 04/11/2018   Procedure: APPLICATION OF WOUND VAC;  Surgeon: Algernon Huxley, MD;  Location: [facility] ORS;  Service: Vascular;  Laterality: Right;  . AV FISTULA PLACEMENT Left 09/18/2015   Procedure: INSERTION OF ARTERIOVENOUS (AV) GORE-TEX GRAFT ARM ( BRACH/AXILLARY GRAFT W/ INSTANT STICK GRAFT );  Surgeon: Katha Cabal, MD;  Location: [facility] ORS;  Service: Vascular;  Laterality: Left;  . AV FISTULA PLACEMENT Right 07/19/2018   Procedure: INSERTION OF ARTERIOVENOUS (AV) GORE-TEX GRAFT ARM ( BRACHIAL AXILLARY);  Surgeon: Algernon Huxley, MD;  Location: [facility] ORS;  Service: Vascular;  Laterality: Right;  . DIALYSIS FISTULA CREATION Right 10/2017   right chest perm cath  . DIALYSIS/PERMA CATHETER REMOVAL N/A 09/13/2018   Procedure: DIALYSIS/PERMA CATHETER REMOVAL;  Surgeon: Algernon Huxley, MD;  Location: [place] [facility];  Service: Cardiovascular;  Laterality: N/A;  . ESOPHAGOGASTRODUODENOSCOPY N/A 12/19/2017   Procedure: ESOPHAGOGASTRODUODENOSCOPY (EGD);  Surgeon: Lin Landsman, MD;  Location: [facility] ENDOSCOPY;  Service: Gastroenterology;  Laterality: N/A;  . LOWER EXTREMITY ANGIOGRAPHY Left 11/16/2017   Procedure: LOWER EXTREMITY ANGIOGRAPHY;  Surgeon: Algernon Huxley, MD;  Location: [place] [facility];  Service: Cardiovascular;  Laterality: Left;  . LOWER EXTREMITY ANGIOGRAPHY Right 01/18/2018   Procedure: LOWER EXTREMITY ANGIOGRAPHY;  Surgeon: Algernon Huxley, MD;  Location: [place] [facility];  Service:  Cardiovascular;  Laterality: Right;  . LOWER EXTREMITY ANGIOGRAPHY Left 04/02/2018   Procedure: LOWER EXTREMITY ANGIOGRAPHY;  Surgeon: Algernon Huxley, MD;  Location: [place] [facility];  Service: Cardiovascular;  Laterality: Left;  . LOWER EXTREMITY ANGIOGRAPHY Right 04/09/2018   Procedure: Lower Extremity Angiography with possible intervention;  Surgeon: Algernon Huxley, MD;  Location: [place] [facility];  Service: Cardiovascular;  Laterality: Right;  . LOWER EXTREMITY ANGIOGRAPHY Right 07/23/2018   Procedure: Lower Extremity Angiography;  Surgeon: Algernon Huxley, MD;  Location: [place] [facility];  Service: Cardiovascular;  Laterality: Right;  . LOWER EXTREMITY ANGIOGRAPHY Right 09/13/2018   Procedure: LOWER EXTREMITY ANGIOGRAPHY;  Surgeon: Algernon Huxley, MD;  Location: [place] [facility];  Service: Cardiovascular;  Laterality: Right;  . LOWER EXTREMITY VENOGRAPHY Right 09/13/2018   Procedure: LOWER EXTREMITY VENOGRAPHY;  Surgeon: Algernon Huxley, MD;  Location: [place] [facility];  Service: Cardiovascular;  Laterality: Right;  . PERIPHERAL VASCULAR CATHETERIZATION  Left 09/01/2015   Procedure: A/V Shuntogram/Fistulagram;  Surgeon: Katha Cabal, MD;  Location: [place] [facility];  Service: Cardiovascular;  Laterality: Left;  . PERIPHERAL VASCULAR CATHETERIZATION N/A 09/30/2015   Procedure: A/V Shuntogram/Fistulagram with perm cathether removal;  Surgeon: Algernon Huxley, MD;  Location: [place] [facility];  Service: Cardiovascular;  Laterality: N/A;  . PERIPHERAL VASCULAR CATHETERIZATION Left 09/30/2015   Procedure: A/V Shunt Intervention;  Surgeon: Algernon Huxley, MD;  Location: [place] [facility];  Service: Cardiovascular;  Laterality: Left;  . PERIPHERAL VASCULAR CATHETERIZATION Left 12/03/2015   Procedure: Thrombectomy;  Surgeon: Algernon Huxley, MD;  Location: [place] [facility];  Service: Cardiovascular;  Laterality: Left;  . PERIPHERAL VASCULAR CATHETERIZATION Left 01/28/2016   Procedure:  Thrombectomy;  Surgeon: Algernon Huxley, MD;  Location: [place] [facility];  Service: Cardiovascular;  Laterality: Left;  . PERIPHERAL VASCULAR CATHETERIZATION N/A 01/28/2016   Procedure: A/V Shuntogram/Fistulagram;  Surgeon: Algernon Huxley, MD;  Location: [place] [facility];  Service: Cardiovascular;  Laterality: N/A;  . SKIN SPLIT GRAFT Right 05/24/2018   Procedure: SKIN GRAFT SPLIT THICKNESS ( RIGHT CALF);  Surgeon: Algernon Huxley, MD;  Location: [facility] ORS;  Service: Vascular;  Laterality: Right;  . WOUND DEBRIDEMENT Right 04/11/2018   Procedure: DEBRIDEMENT WOUND calf muscle and skin;  Surgeon: Algernon Huxley, MD;  Location: [facility] ORS;  Service: Vascular;  Laterality: Right;   Social History Social History   Tobacco Use  . Smoking status: Former Smoker    Types: Cigarettes    Quit date: 05/17/2005    Years since quitting: 13.4  . Smokeless tobacco: Never Used  Substance Use Topics  . Alcohol use: No  . Drug use: No   Family History Family History  Problem Relation Age of Onset  . Hypertension Other   . Heart disease Other   . Diabetes Mother   Denies family history of peripheral artery disease, venous disease and/or renal disease.  Allergies  Allergen Reactions  . Shellfish Allergy Anaphylaxis   REVIEW OF SYSTEMS (Negative unless checked)  Constitutional: [] Weight loss  [] Fever  [] Chills Cardiac: [] Chest pain   [] Chest pressure   [] Palpitations   [] Shortness of breath when laying flat   [] Shortness of breath at rest   [] Shortness of breath with exertion. Vascular:  [] Pain in legs with walking   [] Pain in legs at rest   [] Pain in legs when laying flat   [] Claudication   [] Pain in feet when walking  [] Pain in feet at rest  [] Pain in feet when laying flat   [] History of DVT   [] Phlebitis   [] Swelling in legs   [] Varicose veins   [x] Non-healing ulcers Pulmonary:   [] Uses home oxygen   [] Productive cough   [] Hemoptysis   [] Wheeze  [] COPD   [] Asthma Neurologic:  [] Dizziness  [] Blackouts    [] Seizures   [] History of stroke   [] History of TIA  [] Aphasia   [] Temporary blindness   [] Dysphagia   [] Weakness or numbness in arms   [] Weakness or numbness in legs Musculoskeletal:  [] Arthritis   [] Joint swelling   [] Joint pain   [] Low back pain Hematologic:  [] Easy bruising  [] Easy bleeding   [] Hypercoagulable state   [] Anemic  [] Hepatitis Gastrointestinal:  [] Blood in stool   [] Vomiting blood  [] Gastroesophageal reflux/heartburn   [] Difficulty swallowing. Genitourinary:  [x] Chronic kidney disease   [] Difficult urination  [] Frequent urination  [] Burning with urination   [] Blood in urine Skin:  [] Rashes   [x] Ulcers   [  x]Wounds Psychological:  [] History of anxiety   []  History of major depression.  Physical Examination  Vitals:   10/22/18 1417 10/22/18 1419  BP: (!) 163/78   Pulse: (!) 106   Resp: 20   Temp: 98.3 F (36.8 C)   TempSrc: Oral   SpO2: 98%   Weight:  68.9 kg  Height:  5\' 8"  (1.727 m)   Body mass index is 23.11 kg/m. Gen:  WD/WN, NAD, Lethargic - possibly confused Head: [place]/AT, No temporalis wasting. Prominent temp pulse not noted. Ear/Nose/Throat: Hearing grossly intact, nares w/o erythema or drainage, oropharynx w/o Erythema/Exudate Eyes: Sclera non-icteric, conjunctiva clear Neck: Trachea midline.  No JVD.  Pulmonary:  Good air movement, respirations not labored, equal bilaterally.  Cardiac: RRR, normal S1, S2. Vascular:  Vessel Right Left  Radial Palpable Palpable  Ulnar Palpable Palpable  Brachial Palpable Palpable  Carotid Palpable, without bruit Palpable, without bruit  Aorta Not palpable N/A  Femoral Palpable Palpable  Popliteal Palpable Palpable  PT Non-Palpable Non-Palpable  DP Non-Palpable Non-Palpable   Right Upper Extremity: Moderate swelling to the upper arm/forearm.  Soft.  There is a bruit and palpable thrill to the right graft.  Extremity is warm distally and becomes slightly cooler at the hand and fingers.  2+ radial pulse noted.   Motor/sensory is intact.  No ulceration is noted.  Right Lower Extremity:   10/22/2018 15:14  Attached To:  Hospital Encounter on 10/22/18   Wound with about 40% fibrinous exudate. Dry. No drainage. No odor. Thigh soft. Calf soft. Extremity warm. Unable to palpate pedal pulses. Motor / sensory intact.   Gastrointestinal: soft, non-tender/non-distended. No guarding/reflex.  Musculoskeletal: M/S 5/5 throughout.   Neurologic: Sensation grossly intact in extremities.  Symmetrical.  Speech is fluent. Motor exam as listed above. Psychiatric: Judgment intact, Mood & affect appropriate for pt's clinical situation. Dermatologic: As above Lymph : No Cervical, Axillary, or Inguinal lymphadenopathy.  CBC Lab Results  Component Value Date   WBC 9.8 08/23/2018   HGB 9.8 (L) 08/23/2018   HCT 32.4 (L) 08/23/2018   MCV 97.0 08/23/2018   PLT 291 08/23/2018   BMET    Component Value Date/Time   NA 134 (L) 08/23/2018 1413   NA 139 01/17/2014 0502   K 4.5 08/23/2018 1413   K 4.8 01/17/2014 0502   CL 99 08/23/2018 1413   CL 102 01/17/2014 0502   CO2 23 08/23/2018 1413   CO2 27 01/17/2014 0502   GLUCOSE 101 (H) 08/23/2018 1413   GLUCOSE 101 (H) 01/17/2014 0502   BUN 13 08/23/2018 1413   BUN 62 (H) 01/17/2014 0502   CREATININE 5.97 (H) 08/23/2018 1413   CREATININE 12.30 (H) 01/17/2014 0502   CALCIUM 9.8 08/23/2018 1413   CALCIUM 8.2 (L) 01/17/2014 0502   GFRNONAA 9 (L) 08/23/2018 1413   GFRNONAA 4 (L) 01/17/2014 0502   GFRAA 10 (L) 08/23/2018 1413   GFRAA 4 (L) 01/17/2014 0502   CrCl cannot be calculated (Patient's most recent lab result is older than the maximum 21 days allowed.).  COAG Lab Results  Component Value Date   INR 1.1 07/17/2018   INR 1.20 05/30/2018   INR 1.12 05/17/2018   Radiology Dg Tibia/fibula Right  Result Date: 10/11/2018 CLINICAL DATA:  Infection. EXAM: RIGHT TIBIA AND FIBULA - 2 VIEW COMPARISON:  08/23/2018 FINDINGS: There are degenerative changes of the  knee. A popliteal stent is noted. There is extensive soft tissue swelling about the lower extremity with multiple areas of ulceration. Vascular  calcifications are noted. There is no definite radiographic evidence of osteomyelitis. There are degenerative changes of the mortise joint. IMPRESSION: 1. No acute displaced fracture or dislocation. 2. No definite radiographic evidence for osteomyelitis. 3. Extensive soft tissue swelling about the lower extremity with areas of skin ulceration. These findings are nonspecific but can be seen in patients with an underlying infectious process or chronic vascular (arterial or venous) compromise. Electronically Signed   By: Constance Holster M.D.   On: 10/11/2018 20:53   Assessment/Plan The patient is a 68 year old male well-known to our practice with a past medical history of hypertension, hyperlipidemia, diabetes, congestive heart failure, anemia, myocardial infarction, neuromuscular disorder, end-stage renal disease on chronic hemodialysis, peripheral vascular disease with slow healing right lower extremity wound status post debridement status post skin graft who presents to the [facility] emergency department with a chief complaint of a right lower extremity wound. 1. Right Lower Extremity Wound:   Most recent endovascular intervention on Sep 13, 2018: 1. Ultrasound guidance for vascular access left femoral artery 2. Catheter placement into right common femoral artery from left femoral approach 3. Aortogram and selective right lower extremity angiogram  4. StarClose closure device left femoral artery Findings: Right lower Extremity: This demonstrated no hemodynamically significant stenosis within the right common femoral artery, SFA, or popliteal artery.  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  His perfusion was adequate for wound healing and no revascularization was needed.  At the completion of the patient's last endovascular intervention he was noted to have two-vessel runoff with the anterior tibial artery and peroneal arteries perfusing the extremity.  The patient's wound has been showing very slow improvement even with weekly follow-up to the [facility].  There is about 40% fibrinous exudate noted in the wound bed.  Recommend debriding the wound and applying a VAC and attempt to assist with wound healing.  We will plan on this on Wednesday with Dr. Lucky Cowboy.  2. ESRD on Chronic Dialysis: Patient presents with swelling to the right upper extremity x1 week.  The patient notes that his right hand is numb with progressively worsening pain.  There is a bruit and thrill noted to the access however it is worrisome for possible steal.  No ulcerations are noted on exam.  We will plan on a right upper extremity shuntogram with possible intervention with possible PermCath placement on Wednesday with Dr. Lucky Cowboy. Need to consult nephrology as he will need to dialyze while an inpatient. 3. Hyperlipidemia: On statin for medical management.  Also on Eliquis and Plavix. Encouraged good control as its slows the progression of atherosclerotic disease 4. Diabetes: On appropriate medications. Encouraged good control as its slows the progression of atherosclerotic disease and impedes wound healing. 5. Hypertension: On appropriate medications. Encouraged good control as its slows the progression of atherosclerotic disease  Johnny Zinni A Khadir Roam, PA-C  10/22/2018 3:58 PM  This note was created with Dragon medical transcription system.  Any error is purely unintentional

## 2018-10-22 NOTE — H&P (Signed)
[name] NAME: Johnny [name]    MR#:  [id number]  DATE OF BIRTH:  Dec 11, 1950  DATE OF ADMISSION:  10/22/2018  PRIMARY CARE PHYSICIAN: [name], MD   REQUESTING/REFERRING PHYSICIAN: Dr. Jacqualine Code  CHIEF COMPLAINT:   Chief Complaint  Patient presents with  . Wound Infection    HISTORY OF PRESENT ILLNESS:  Johnny [name]  is a 68 y.o. male with a known history of peripheral arterial disease, chronic right lower extremity ulcer, hypertension, end-stage renal disease on hemodialysis presents to the emergency room due to chronic right lower extremity wound and swelling of right upper extremity.  Patient has had chronic right lower extremity wound which seems to be slowly healing.  Here in the emergency room Dopplers were unable to find good pulses in right lower extremity.  Evaluated by vascular team in the emergency room and scheduled for right lower extremity angiogram and debridement on Wednesday and also the right upper extremity shuntogram. Patient had hemodialysis earlier today.  He tells me he is on an antibiotic at home which is not available through his chart.  No significant pain or discharge. He uses a walker and wheelchair at baseline.  PAST MEDICAL HISTORY:   Past Medical History:  Diagnosis Date  . Anemia   . Anxiety   . CHF (congestive heart failure) ([place])   . Chronic kidney disease    esrd dialysis m/w/f  . Gout   . Hyperlipidemia   . Hypertension   . Myocardial infarction ([place]) 2010   10 years ago  . 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  . Peripheral vascular disease ([place])     PAST SURGICAL HISTORY:   Past Surgical History:  Procedure Laterality Date  . A/V FISTULAGRAM Right 09/06/2018   Procedure: A/V FISTULAGRAM;  Surgeon: Algernon Huxley, MD;  Location: [facility];  Service: Cardiovascular;  Laterality: Right;  . A/V SHUNTOGRAM Left 06/21/2017   Procedure:  A/V SHUNTOGRAM;  Surgeon: Katha Cabal, MD;  Location: [facility];  Service: Cardiovascular;  Laterality: Left;  . APPLICATION OF WOUND VAC Right 04/11/2018   Procedure: APPLICATION OF WOUND VAC;  Surgeon: Algernon Huxley, MD;  Location: [facility];  Service: Vascular;  Laterality: Right;  . AV FISTULA PLACEMENT Left 09/18/2015   Procedure: INSERTION OF ARTERIOVENOUS (AV) GORE-TEX GRAFT ARM ( BRACH/AXILLARY GRAFT W/ INSTANT STICK GRAFT );  Surgeon: Katha Cabal, MD;  Location: [facility];  Service: Vascular;  Laterality: Left;  . AV FISTULA PLACEMENT Right 07/19/2018   Procedure: INSERTION OF ARTERIOVENOUS (AV) GORE-TEX GRAFT ARM ( BRACHIAL AXILLARY);  Surgeon: Algernon Huxley, MD;  Location: [facility];  Service: Vascular;  Laterality: Right;  . DIALYSIS FISTULA CREATION Right 10/2017   right chest perm cath  . DIALYSIS/PERMA CATHETER REMOVAL N/A 09/13/2018   Procedure: DIALYSIS/PERMA CATHETER REMOVAL;  Surgeon: Algernon Huxley, MD;  Location: [facility];  Service: Cardiovascular;  Laterality: N/A;  . ESOPHAGOGASTRODUODENOSCOPY N/A 12/19/2017   Procedure: ESOPHAGOGASTRODUODENOSCOPY (EGD);  Surgeon: Lin Landsman, MD;  Location: [facility];  Service: Gastroenterology;  Laterality: N/A;  . LOWER EXTREMITY ANGIOGRAPHY Left 11/16/2017   Procedure: LOWER EXTREMITY ANGIOGRAPHY;  Surgeon: Algernon Huxley, MD;  Location: [facility];  Service: Cardiovascular;  Laterality: Left;  . LOWER EXTREMITY ANGIOGRAPHY Right 01/18/2018   Procedure: LOWER EXTREMITY ANGIOGRAPHY;  Surgeon: Algernon Huxley, MD;  Location: [facility];  Service: Cardiovascular;  Laterality: Right;  . LOWER EXTREMITY ANGIOGRAPHY Left 04/02/2018   Procedure: LOWER EXTREMITY ANGIOGRAPHY;  Surgeon: Algernon Huxley, MD;  Location: [facility];  Service: Cardiovascular;  Laterality: Left;  . LOWER EXTREMITY ANGIOGRAPHY Right 04/09/2018   Procedure: Lower Extremity Angiography with possible intervention;   Surgeon: Algernon Huxley, MD;  Location: [facility];  Service: Cardiovascular;  Laterality: Right;  . LOWER EXTREMITY ANGIOGRAPHY Right 07/23/2018   Procedure: Lower Extremity Angiography;  Surgeon: Algernon Huxley, MD;  Location: [facility];  Service: Cardiovascular;  Laterality: Right;  . LOWER EXTREMITY ANGIOGRAPHY Right 09/13/2018   Procedure: LOWER EXTREMITY ANGIOGRAPHY;  Surgeon: Algernon Huxley, MD;  Location: [facility];  Service: Cardiovascular;  Laterality: Right;  . LOWER EXTREMITY VENOGRAPHY Right 09/13/2018   Procedure: LOWER EXTREMITY VENOGRAPHY;  Surgeon: Algernon Huxley, MD;  Location: [facility];  Service: Cardiovascular;  Laterality: Right;  . PERIPHERAL VASCULAR CATHETERIZATION Left 09/01/2015   Procedure: A/V Shuntogram/Fistulagram;  Surgeon: Katha Cabal, MD;  Location: [facility];  Service: Cardiovascular;  Laterality: Left;  . PERIPHERAL VASCULAR CATHETERIZATION N/A 09/30/2015   Procedure: A/V Shuntogram/Fistulagram with perm cathether removal;  Surgeon: Algernon Huxley, MD;  Location: [facility];  Service: Cardiovascular;  Laterality: N/A;  . PERIPHERAL VASCULAR CATHETERIZATION Left 09/30/2015   Procedure: A/V Shunt Intervention;  Surgeon: Algernon Huxley, MD;  Location: [facility];  Service: Cardiovascular;  Laterality: Left;  . PERIPHERAL VASCULAR CATHETERIZATION Left 12/03/2015   Procedure: Thrombectomy;  Surgeon: Algernon Huxley, MD;  Location: [facility];  Service: Cardiovascular;  Laterality: Left;  . PERIPHERAL VASCULAR CATHETERIZATION Left 01/28/2016   Procedure: Thrombectomy;  Surgeon: Algernon Huxley, MD;  Location: [facility];  Service: Cardiovascular;  Laterality: Left;  . PERIPHERAL VASCULAR CATHETERIZATION N/A 01/28/2016   Procedure: A/V Shuntogram/Fistulagram;  Surgeon: Algernon Huxley, MD;  Location: [facility];  Service: Cardiovascular;  Laterality: N/A;  . SKIN SPLIT GRAFT Right 05/24/2018    Procedure: SKIN GRAFT SPLIT THICKNESS ( RIGHT CALF);  Surgeon: Algernon Huxley, MD;  Location: [facility];  Service: Vascular;  Laterality: Right;  . WOUND DEBRIDEMENT Right 04/11/2018   Procedure: DEBRIDEMENT WOUND calf muscle and skin;  Surgeon: Algernon Huxley, MD;  Location: [facility];  Service: Vascular;  Laterality: Right;    SOCIAL HISTORY:   Social History   Tobacco Use  . Smoking status: Former Smoker    Types: Cigarettes    Quit date: 05/17/2005    Years since quitting: 13.4  . Smokeless tobacco: Never Used  Substance Use Topics  . Alcohol use: No    FAMILY HISTORY:   Family History  Problem Relation Age of Onset  . Hypertension Other   . Heart disease Other   . Diabetes Mother     DRUG ALLERGIES:   Allergies  Allergen Reactions  . Shellfish Allergy Anaphylaxis    REVIEW OF SYSTEMS:   Review of Systems  Constitutional: Positive for malaise/fatigue. Negative for chills, fever and weight loss.  HENT: Negative for hearing loss and nosebleeds.   Eyes: Negative for blurred vision, double vision and pain.  Respiratory: Negative for cough, hemoptysis, sputum production, shortness of breath and wheezing.   Cardiovascular: Negative for chest pain, palpitations, orthopnea and leg swelling.  Gastrointestinal: Negative for abdominal pain, constipation, diarrhea, nausea and vomiting.  Genitourinary: Negative for dysuria and hematuria.  Musculoskeletal: Negative for  back pain, falls and myalgias.  Skin: Negative for rash.  Neurological: Negative for dizziness, tremors, sensory change, speech change, focal weakness, seizures and headaches.  Endo/Heme/Allergies: Does not bruise/bleed easily.  Psychiatric/Behavioral: Negative for depression and memory loss. The patient is not nervous/anxious.     MEDICATIONS AT HOME:   Prior to Admission medications   Medication Sig Start Date End Date Taking? Authorizing Provider  allopurinol (ZYLOPRIM) 100 MG tablet Take 100 mg by mouth daily.    Yes [provider]  atorvastatin (LIPITOR) 10 MG tablet Take 1 tablet by mouth daily. 11/23/17  Yes [provider]  clonazePAM (KLONOPIN) 0.5 MG tablet Take 1 tablet (0.5 mg total) by mouth as needed. Patient taking differently: Take 0.25 mg by mouth 2 (two) times daily as needed for anxiety.  07/26/18  Yes Mody, Ulice Bold, MD  clopidogrel (PLAVIX) 75 MG tablet Take 1 tablet (75 mg total) by mouth daily. 04/02/18  Yes Dew, Erskine Squibb, MD  folic acid-vitamin b complex-vitamin c-selenium-zinc (DIALYVITE) 3 MG TABS tablet Take 1 tablet by mouth daily.    Yes [provider]  gabapentin (NEURONTIN) 100 MG capsule Take 1 capsule (100 mg total) by mouth at bedtime. Patient taking differently: Take 100 mg by mouth 2 (two) times daily.  07/26/18  Yes Mody, Ulice Bold, MD  HYDROcodone-acetaminophen (NORCO) 5-325 MG tablet Take 1 tablet by mouth every 6 (six) hours as needed for moderate pain. 09/13/18  Yes Stegmayer, Janalyn Harder, PA-C  losartan (COZAAR) 100 MG tablet Take 100 mg by mouth daily.   Yes [provider]  metoprolol tartrate (LOPRESSOR) 25 MG tablet Take 25 mg by mouth every evening.   Yes [provider]  sevelamer carbonate (RENVELA) 800 MG tablet Take 2,400 mg by mouth 3 (three) times daily with meals.  09/20/18  Yes [provider]  apixaban (ELIQUIS) 2.5 MG TABS tablet Take 1 tablet (2.5 mg total) by mouth 2 (two) times daily. Patient not taking: Reported on 10/22/2018 07/26/18   Bettey Costa, MD     VITAL SIGNS:  Blood pressure (!) 199/93, pulse (!) 110, temperature 98.3 F (36.8 C), temperature source Oral, resp. rate (!) 25, height 5\' 8"  (1.727 m), weight 68.9 kg, SpO2 99 %.  PHYSICAL EXAMINATION:  Physical Exam  GENERAL:  68 y.o.-year-old patient lying in the bed with no acute distress.  EYES: Pupils equal, round, reactive to light and accommodation. No scleral icterus. Extraocular muscles intact.  HEENT: Head atraumatic, normocephalic.  Oropharynx and nasopharynx clear. No oropharyngeal erythema, moist oral mucosa  NECK:  Supple, no jugular venous distention. No thyroid enlargement, no tenderness.  LUNGS: Normal breath sounds bilaterally, no wheezing, rales, rhonchi. No use of accessory muscles of respiration.  CARDIOVASCULAR: S1, S2 normal. No murmurs, rubs, or gallops.  ABDOMEN: Soft, nontender, nondistended. Bowel sounds present. No organomegaly or mass.  EXTREMITIES: No pedal edema, cyanosis, or clubbing. + 2 pedal & radial pulses b/l.   NEUROLOGIC: Cranial nerves II through XII are intact. No focal Motor or sensory deficits appreciated b/l PSYCHIATRIC: The patient is alert and oriented x 3. Good affect.  SKIN: Large right lower extremity wound on his right heel and extending up on ventral leg.  Looks dry exposing the fat tissue.  No discharge or foul smell.  Unable to palpate any pulses in his right lower extremity.  LABORATORY PANEL:   CBC No results for input(s): WBC, HGB, HCT, PLT in the last 168 hours. ------------------------------------------------------------------------------------------------------------------  Chemistries  No results for input(s): NA,  K, CL, CO2, GLUCOSE, BUN, CREATININE, CALCIUM, MG, AST, ALT, ALKPHOS, BILITOT in the last 168 hours.  Invalid input(s): GFRCGP ------------------------------------------------------------------------------------------------------------------  Cardiac Enzymes No results for input(s): TROPONINI in the last 168 hours. ------------------------------------------------------------------------------------------------------------------  RADIOLOGY:  US Venous Img Lower Unilateral Right  Result Date: 10/22/2018 CLINICAL DATA:  68 year old male with a history EXAM: RIGHT LOWER EXTREMITY VENOUS DOPPLER ULTRASOUND TECHNIQUE: Gray-scale sonography with graded compression, as well as color Doppler and duplex ultrasound were performed to evaluate the lower extremity deep  venous systems from the level of the common femoral vein and including the common femoral, femoral, profunda femoral, popliteal and calf veins including the posterior tibial, peroneal and gastrocnemius veins when visible. The superficial great saphenous vein was also interrogated. Spectral Doppler was utilized to evaluate flow at rest and with distal augmentation maneuvers in the common femoral, femoral and popliteal veins. COMPARISON:  None. FINDINGS: Contralateral Common Femoral Vein: Respiratory phasicity is normal and symmetric with the symptomatic side. No evidence of thrombus. Normal compressibility. Common Femoral Vein: No evidence of thrombus. Normal compressibility, respiratory phasicity and response to augmentation. Saphenofemoral Junction: No evidence of thrombus. Normal compressibility and flow on color Doppler imaging. Profunda Femoral Vein: No evidence of thrombus. Normal compressibility and flow on color Doppler imaging. Femoral Vein: No evidence of thrombus. Normal compressibility, respiratory phasicity and response to augmentation. Popliteal Vein: No evidence of thrombus. Normal compressibility, respiratory phasicity and response to augmentation. Calf Veins: Posterior tibial vein and peroneal vein not well visualized. Superficial Great Saphenous Vein: No evidence of thrombus. Normal compressibility and flow on color Doppler imaging. Other Findings: Edema. Lymph nodes of the inguinal region with typical architecture maintained, borderline enlarged. Vascular atherosclerosis incidentally imaged. IMPRESSION: Sonographic survey of the right lower extremity negative for DVT. Edema. Lymph nodes of the inguinal region, most likely reactive. Electronically Signed   By: Corrie Mckusick D.O.   On: 10/22/2018 16:30   US Venous Img Upper Uni Right  Result Date: 10/22/2018 CLINICAL DATA:  Right upper extremity pain and edema for the past 4-5 days. History upper extremity dialysis AV fistula. EXAM: RIGHT UPPER  EXTREMITY VENOUS DOPPLER ULTRASOUND TECHNIQUE: Gray-scale sonography with graded compression, as well as color Doppler and duplex ultrasound were performed to evaluate the upper extremity deep venous system from the level of the subclavian vein and including the jugular, axillary, basilic, radial, ulnar and upper cephalic vein. Spectral Doppler was utilized to evaluate flow at rest and with distal augmentation maneuvers. COMPARISON:  None. FINDINGS: Contralateral Subclavian Vein: Respiratory phasicity is normal and symmetric with the symptomatic side. No evidence of thrombus. Normal compressibility. Internal Jugular Vein: No evidence of thrombus. Normal compressibility, respiratory phasicity and response to augmentation. Subclavian Vein: No evidence of thrombus. Normal compressibility, respiratory phasicity and response to augmentation. Axillary Vein: No evidence of thrombus. Normal compressibility, respiratory phasicity and response to augmentation. Cephalic Vein: No evidence of thrombus. Normal compressibility, respiratory phasicity and response to augmentation. Basilic Vein: No evidence of thrombus. Normal compressibility, respiratory phasicity and response to augmentation. Brachial Veins: No evidence of thrombus. Normal compressibility, respiratory phasicity and response to augmentation. Radial Veins: No evidence of thrombus. Normal compressibility, respiratory phasicity and response to augmentation. Ulnar Veins: No evidence of thrombus. Normal compressibility, respiratory phasicity and response to augmentation. Venous Reflux:  None visualized. Other Findings:  None visualized. IMPRESSION: No evidence of DVT within the right upper extremity. Electronically Signed   By: Sandi Mariscal M.D.   On: 10/22/2018 16:29   [name]  Result Date: 10/22/2018 If Site Rite image not attached, placement could not be confirmed due to current cardiac rhythm.    IMPRESSION AND PLAN:   *Right lower extremity chronic  wound with peripheral arterial disease.  Angiogram and debridement planned by vascular surgery on Wednesday.  No need for any antibiotics at this time.  Labs pending.  *Right upper extremity swelling with fistula present.  Shuntogram being scheduled by vascular surgery  *Uncontrolled hypertension.  Will start home medications.  IV hydralazine PRN.  *End-stage renal disease on hemodialysis.  Consulted nephrology.  Patient had a full hemodialysis session earlier today.  *Chronic diastolic CHF.  Stable.  DVT prophylaxis with heparin  All the records are reviewed and case discussed with ED provider. Management plans discussed with the patient, family and they are in agreement.  CODE STATUS: Full code  TOTAL TIME TAKING CARE OF THIS PATIENT: 40 minutes.   Neita Carp M.D on 10/22/2018 at 5:38 PM  Between 7am to 6pm - Pager - [phone]  After 6pm go to [email or web address] - password [email or web address] [place] [facility]  Office  [phone]  CC: Primary care physician; [name], MD  Note: This dictation was prepared with Dragon dictation along with smaller phrase technology. Any transcriptional errors that result from this process are unintentional.

## 2018-10-22 NOTE — ED Notes (Addendum)
New dressing applied to RLE

## 2018-10-22 NOTE — Progress Notes (Signed)
Johnny [name] ([id number]) Visit Report for 10/18/2018 Arrival Information Details Patient Name: Johnny [name], Johnny [name] Date of Service: 10/18/2018 9:15 AM Medical Record Number: [id number] Patient Account Number: [id number] Date of Birth/Sex: [birth year] (68 y.o. M) Treating RN: Army Melia Primary Care Judson Tsan: Kingsley Spittle Other Clinician: Referring Malissa Slay: Kingsley Spittle Treating Christoper Bushey/Extender: Melburn Hake, HOYT Weeks in Treatment: 1 Visit Information History Since Last Visit Added or deleted any medications: No Patient Arrived: Wheel Chair Any new allergies or adverse reactions: No Arrival Time: 09:00 Had a fall or experienced change in No Accompanied By: self activities of daily living that may affect Transfer Assistance: None risk of falls: Patient Requires Transmission-Based No Signs or symptoms of abuse/neglect since last visito No Precautions: Hospitalized since last visit: No Patient Has Alerts: Yes Has Dressing in Place as Prescribed: Yes Patient Alerts: Patient on Blood Pain Present Now: Yes Thinner DMII Eliquis Electronic Signature(s) Signed: 10/18/2018 3:09:24 PM By: Montey Hora Entered By: Montey Hora on 10/18/2018 09:24:50 Johnny [name] ([id number]) -------------------------------------------------------------------------------- Encounter Discharge Information Details Patient Name: Johnny [name] Date of Service: 10/18/2018 9:15 AM Medical Record Number: [id number] Patient Account Number: [id number] Date of Birth/Sex: [birth year] (68 y.o. M) Treating RN: Montey Hora Primary Care Adewale Pucillo: Kingsley Spittle Other Clinician: Referring Hayla Hinger: Kingsley Spittle Treating Sanae Willetts/Extender: Melburn Hake, HOYT Weeks in Treatment: 1 Encounter Discharge Information Items Post Procedure Vitals Discharge Condition: Stable Temperature (F): 98.3 Ambulatory Status: Wheelchair Pulse (bpm): 110 Discharge Destination: Home Respiratory Rate (breaths/min):  16 Transportation: Private Auto Blood Pressure (mmHg): 148/62 Accompanied By: self Schedule Follow-up Appointment: Yes Clinical Summary of Care: Electronic Signature(s) Signed: 10/18/2018 3:09:24 PM By: Montey Hora Entered By: Montey Hora on 10/18/2018 10:06:25 Johnny [name] ([id number]) -------------------------------------------------------------------------------- Lower Extremity Assessment Details Patient Name: Johnny [name] Date of Service: 10/18/2018 9:15 AM Medical Record Number: [id number] Patient Account Number: [id number] Date of Birth/Sex: 09/23/1950 (68 y.o. M) Treating RN: Army Melia Primary Care Burech Mcfarland: Kingsley Spittle Other Clinician: Referring Elena Cothern: Kingsley Spittle Treating Tula Schryver/Extender: Melburn Hake, HOYT Weeks in Treatment: 1 Edema Assessment Assessed: [Left: No] [Right: No] Edema: [Left: N] [Right: o] Calf Left: Right: Point of Measurement: 35 cm From Medial Instep cm 38 cm Ankle Left: Right: Point of Measurement: 13 cm From Medial Instep cm 28 cm Vascular Assessment Pulses: Dorsalis Pedis Palpable: [Right:Yes] Electronic Signature(s) Signed: 10/18/2018 11:23:15 AM By: Army Melia Entered By: Army Melia on 10/18/2018 09:10:24 Johnny [name] ([id number]) -------------------------------------------------------------------------------- Multi Wound Chart Details Patient Name: Johnny [name] Date of Service: 10/18/2018 9:15 AM Medical Record Number: [id number] Patient Account Number: [id number] Date of Birth/Sex: Mar 16, 1951 (68 y.o. M) Treating RN: Montey Hora Primary Care Miela Desjardin: Kingsley Spittle Other Clinician: Referring Benjamyn Hestand: Kingsley Spittle Treating Shikha Bibb/Extender: Melburn Hake, HOYT Weeks in Treatment: 1 Vital Signs Height(in): 68 Pulse(bpm): 110 Weight(lbs): 154 Blood Pressure(mmHg): 148/62 Body Mass Index(BMI): 23 Temperature(F): 98.3 Respiratory Rate 16 (breaths/min): Photos: Wound Location: Right Lower Leg -  Posterior Right Calcaneus - Lateral Right Lower Leg - Anterior Wounding Event: Gradually Appeared Gradually Appeared Gradually Appeared Primary Etiology: Diabetic Wound/Ulcer of the Diabetic Wound/Ulcer of the Diabetic Wound/Ulcer of the Lower Extremity Lower Extremity Lower Extremity Comorbid History: Cataracts, Congestive Heart Cataracts, Congestive Heart Cataracts, Congestive Heart Failure, Deep Vein Failure, Deep Vein Failure, Deep Vein Thrombosis, Hypertension, Thrombosis, Hypertension, Thrombosis, Hypertension, Peripheral Venous Disease, Peripheral Venous Disease, Peripheral Venous Disease, Type II Diabetes, End Stage Type II Diabetes, End Stage Type II Diabetes, End Stage Renal Disease, History of Renal Disease, History of Renal Disease, History of pressure wounds, Gout, pressure wounds, Gout, pressure wounds, Gout,  Neuropathy Neuropathy Neuropathy Date Acquired: 10/02/2017 11/06/2017 11/06/2017 Weeks of Treatment: 1 1 1  Wound Status: Open Open Open Pending Amputation on Yes Yes Yes Presentation: Measurements L x W x D 18x22x0.3 4.7x2.7x0.4 5.5x5.5x0.2 (cm) Area (cm) : 311.018 9.967 23.758 Volume (cm) : 93.305 3.987 4.752 % Reduction in Area: -5.90% -16.20% 61.20% % Reduction in Volume: -217.60% -16.20% 22.40% Classification: Grade 2 Grade 1 Grade 1 Exudate Amount: Large Medium Small Exudate Type: Purulent Purulent Purulent Exudate Color: yellow, brown, green yellow, brown, green yellow, brown, green Foul Odor After Cleansing: Yes No No No N/A N/A Johnny [name] ([id number]) Odor Anticipated Due to Product Use: Wound Margin: Indistinct, nonvisible Flat and Intact Flat and Intact Granulation Amount: Medium (34-66%) None Present (0%) Small (1-33%) Granulation Quality: Hyper-granulation N/A Red Necrotic Amount: Medium (34-66%) Large (67-100%) Large (67-100%) Necrotic Tissue: Eschar, Adherent Slough Eschar, Adherent [place] Exposed Structures: Fat Layer  (Subcutaneous Fascia: No Fascia: No Tissue) Exposed: Yes Fat Layer (Subcutaneous Fat Layer (Subcutaneous Tendon: Yes Tissue) Exposed: No Tissue) Exposed: No Fascia: No Tendon: No Tendon: No Muscle: No Muscle: No Muscle: No Joint: No Joint: No Joint: No Bone: No Bone: No Bone: No Limited to Skin Breakdown Limited to Skin Breakdown Epithelialization: None None None Treatment Notes Electronic Signature(s) Signed: 10/18/2018 3:09:24 PM By: Montey Hora Entered By: Montey Hora on 10/18/2018 09:22:59 Johnny [name] ([id number]) -------------------------------------------------------------------------------- Multi-Disciplinary Care Plan Details Patient Name: Johnny [name] Date of Service: 10/18/2018 9:15 AM Medical Record Number: [id number] Patient Account Number: [id number] Date of Birth/Sex: 20-Aug-1950 (67 y.o. M) Treating RN: Montey Hora Primary Care Olga Seyler: Kingsley Spittle Other Clinician: Referring Oskar Cretella: Kingsley Spittle Treating Milta Croson/Extender: Melburn Hake, HOYT Weeks in Treatment: 1 Active Inactive Necrotic Tissue Nursing Diagnoses: Impaired tissue integrity related to necrotic/devitalized tissue Knowledge deficit related to management of necrotic/devitalized tissue Goals: Necrotic/devitalized tissue will be minimized in the wound bed Date Initiated: 10/11/2018 Target Resolution Date: 10/18/2018 Goal Status: Active Patient/caregiver will verbalize understanding of reason and process for debridement of necrotic tissue Date Initiated: 10/11/2018 Target Resolution Date: 10/11/2018 Goal Status: Active Interventions: Assess patient pain level pre-, during and post procedure and prior to discharge Provide education on necrotic tissue and debridement process Treatment Activities: Excisional debridement : 10/11/2018 Notes: Orientation to the Wound Care Program Nursing Diagnoses: Knowledge deficit related to the wound healing center  program Goals: Patient/caregiver will verbalize understanding of the [employer] [facility] Program Date Initiated: 10/11/2018 Target Resolution Date: 10/11/2018 Goal Status: Active Interventions: Provide education on orientation to the wound center Notes: Soft Tissue Infection Nursing Diagnoses: Impaired tissue integrity Maloney, Gottlieb ([id number]) Knowledge deficit related to disease process and management Knowledge deficit related to home infection control: handwashing, handling of soiled dressings, supply storage Goals: Patient will remain free of wound infection Date Initiated: 10/11/2018 Target Resolution Date: 10/18/2018 Goal Status: Active Patient/caregiver will verbalize understanding of or measures to prevent infection and contamination in the home setting Date Initiated: 10/11/2018 Target Resolution Date: 10/18/2018 Goal Status: Active Signs and symptoms of infection will be recognized early to allow for prompt treatment Date Initiated: 10/11/2018 Target Resolution Date: 10/18/2018 Goal Status: Active Interventions: Assess signs and symptoms of infection every visit Notes: Wound/Skin Impairment Nursing Diagnoses: Impaired tissue integrity Goals: Patient/caregiver will verbalize understanding of skin care regimen Date Initiated: 10/11/2018 Target Resolution Date: 10/11/2018 Goal Status: Active Ulcer/skin breakdown will have a volume reduction of 30% by week 4 Date Initiated: 10/11/2018 Target Resolution Date: 11/08/2018 Goal Status: Active Interventions: Provide education on ulcer and skin care Treatment  Activities: Patient referred to home care : 10/11/2018 Topical wound management initiated : 10/11/2018 Notes: Electronic Signature(s) Signed: 10/18/2018 3:09:24 PM By: Montey Hora Entered By: Montey Hora on 10/18/2018 09:22:45 Johnny [name] ([id number]) -------------------------------------------------------------------------------- Pain Assessment Details Patient Name:  Johnny [name] Date of Service: 10/18/2018 9:15 AM Medical Record Number: [id number] Patient Account Number: [id number] Date of Birth/Sex: 10/27/1950 (67 y.o. M) Treating RN: Army Melia Primary Care Kateri Balch: Kingsley Spittle Other Clinician: Referring Narda Fundora: Kingsley Spittle Treating Raymona Boss/Extender: Melburn Hake, HOYT Weeks in Treatment: 1 Active Problems Location of Pain Severity and Description of Pain Patient Has Paino Yes Site Locations Pain Location: Generalized Pain, Pain in Ulcers Rate the pain. Current Pain Level: 8 Pain Management and Medication Current Pain Management: Electronic Signature(s) Signed: 10/18/2018 11:23:15 AM By: Army Melia Entered By: Army Melia on 10/18/2018 09:00:51 Johnny [name] ([id number]) -------------------------------------------------------------------------------- Patient/Caregiver Education Details Patient Name: Johnny [name] Date of Service: 10/18/2018 9:15 AM Medical Record Number: [id number] Patient Account Number: [id number] Date of Birth/Gender: June 01, 1950 (67 y.o. M) Treating RN: Montey Hora Primary Care Physician: Kingsley Spittle Other Clinician: Referring Physician: Kingsley Spittle Treating Physician/Extender: Sharalyn Ink in Treatment: 1 Education Assessment Education Provided To: Patient Education Topics Provided Wound/Skin Impairment: Handouts: Other: need for more extensive treatment - plastic surgery Methods: Explain/Verbal Responses: State content correctly Electronic Signature(s) Signed: 10/18/2018 3:09:24 PM By: Montey Hora Entered By: Montey Hora on 10/18/2018 10:04:58 Johnny [name] ([id number]) -------------------------------------------------------------------------------- Wound Assessment Details Patient Name: Johnny [name] Date of Service: 10/18/2018 9:15 AM Medical Record Number: [id number] Patient Account Number: [id number] Date of Birth/Sex: 1951/04/07 (67 y.o. M) Treating RN: Army Melia Primary Care Jailee Jaquez: Kingsley Spittle Other Clinician: Referring Thelton Graca: Kingsley Spittle Treating Catalena Stanhope/Extender: Melburn Hake, HOYT Weeks in Treatment: 1 Wound Status Wound Number: 3 Primary Diabetic Wound/Ulcer of the Lower Extremity Etiology: Wound Location: Right Lower Leg - Posterior Wound Open Wounding Event: Gradually Appeared Status: Date Acquired: 10/02/2017 Comorbid Cataracts, Congestive Heart Failure, Deep Vein Weeks Of Treatment: 1 History: Thrombosis, Hypertension, Peripheral Venous Clustered Wound: No Disease, Type II Diabetes, End Stage Renal Pending Amputation On Presentation Disease, History of pressure wounds, Gout, Neuropathy Photos Photo Uploaded By: Army Melia on 10/18/2018 09:18:07 Wound Measurements Length: (cm) 18 Width: (cm) 22 Depth: (cm) 0.3 Area: (cm) 311.018 Volume: (cm) 93.305 % Reduction in Area: -5.9% % Reduction in Volume: -217.6% Epithelialization: None Tunneling: No Undermining: No Wound Description Classification: Grade 2 Wound Margin: Indistinct, nonvisible Exudate Amount: Large Exudate Type: Purulent Exudate Color: yellow, brown, green Foul Odor After Cleansing: Yes Due to Product Use: No Slough/Fibrino Yes Wound Bed Granulation Amount: Medium (34-66%) Exposed Structure Granulation Quality: Hyper-granulation Fascia Exposed: No Necrotic Amount: Medium (34-66%) Fat Layer (Subcutaneous Tissue) Exposed: Yes Necrotic Quality: Eschar, Adherent Slough Tendon Exposed: Yes Muscle Exposed: No Joint Exposed: No Bone Exposed: No Trigo, [place]ie[place] ([id number]) Treatment Notes Wound #3 (Right, Posterior Lower Leg) Notes santyl to slough covered areas, xeroform over entire wound and abd/kerix Electronic Signature(s) Signed: 10/18/2018 11:23:15 AM By: Army Melia Entered By: Army Melia on 10/18/2018 09:08:23 Johnny [name]  ([id number]) -------------------------------------------------------------------------------- Wound Assessment Details Patient Name: Johnny [name] Date of Service: 10/18/2018 9:15 AM Medical Record Number: [id number] Patient Account Number: [id number] Date of Birth/Sex: 12/25/1950 (67 y.o. M) Treating RN: Army Melia Primary Care Magnolia Mattila: Kingsley Spittle Other Clinician: Referring Leandro Berkowitz: Kingsley Spittle Treating Masiel Gentzler/Extender: Melburn Hake, HOYT Weeks in Treatment: 1 Wound Status Wound Number: 4 Primary Diabetic Wound/Ulcer of the Lower Extremity Etiology: Wound Location: Right Calcaneus - Lateral Wound Open Wounding Event: Gradually Appeared  Status: Date Acquired: 11/06/2017 Comorbid Cataracts, Congestive Heart Failure, Deep Vein Weeks Of Treatment: 1 History: Thrombosis, Hypertension, Peripheral Venous Clustered Wound: No Disease, Type II Diabetes, End Stage Renal Pending Amputation On Presentation Disease, History of pressure wounds, Gout, Neuropathy Photos Photo Uploaded By: Army Melia on 10/18/2018 09:18:08 Wound Measurements Length: (cm) 4.7 Width: (cm) 2.7 Depth: (cm) 0.4 Area: (cm) 9.967 Volume: (cm) 3.987 % Reduction in Area: -16.2% % Reduction in Volume: -16.2% Epithelialization: None Tunneling: No Undermining: No Wound Description Classification: Grade 1 Wound Margin: Flat and Intact Exudate Amount: Medium Exudate Type: Purulent Exudate Color: yellow, brown, green Foul Odor After Cleansing: No Slough/Fibrino Yes Wound Bed Granulation Amount: None Present (0%) Exposed Structure Necrotic Amount: Large (67-100%) Fascia Exposed: No Necrotic Quality: Eschar, Adherent Slough Fat Layer (Subcutaneous Tissue) Exposed: No Tendon Exposed: No Muscle Exposed: No Joint Exposed: No Bone Exposed: No Bang, Fread ([id number]) Limited to Skin Breakdown Treatment Notes Wound #4 (Right, Lateral Calcaneus) Notes santyl to slough covered areas, xeroform  over entire wound and abd/kerix Electronic Signature(s) Signed: 10/18/2018 11:23:15 AM By: Army Melia Entered By: Army Melia on 10/18/2018 09:08:37 Johnny [name] ([id number]) -------------------------------------------------------------------------------- Wound Assessment Details Patient Name: Johnny [name] Date of Service: 10/18/2018 9:15 AM Medical Record Number: [id number] Patient Account Number: [id number] Date of Birth/Sex: Jun 13, 1950 (67 y.o. M) Treating RN: Army Melia Primary Care Decari Duggar: Kingsley Spittle Other Clinician: Referring Eloni Darius: Kingsley Spittle Treating Arshdeep Bolger/Extender: Melburn Hake, HOYT Weeks in Treatment: 1 Wound Status Wound Number: 5 Primary Diabetic Wound/Ulcer of the Lower Extremity Etiology: Wound Location: Right Lower Leg - Anterior Wound Open Wounding Event: Gradually Appeared Status: Date Acquired: 11/06/2017 Comorbid Cataracts, Congestive Heart Failure, Deep Vein Weeks Of Treatment: 1 History: Thrombosis, Hypertension, Peripheral Venous Clustered Wound: No Disease, Type II Diabetes, End Stage Renal Pending Amputation On Presentation Disease, History of pressure wounds, Gout, Neuropathy Photos Photo Uploaded By: Army Melia on 10/18/2018 09:18:29 Wound Measurements Length: (cm) 5.5 Width: (cm) 5.5 Depth: (cm) 0.2 Area: (cm) 23.758 Volume: (cm) 4.752 % Reduction in Area: 61.2% % Reduction in Volume: 22.4% Epithelialization: None Tunneling: No Undermining: No Wound Description Classification: Grade 1 Foul Od Wound Margin: Flat and Intact Slough/ Exudate Amount: Small Exudate Type: Purulent Exudate Color: yellow, brown, green or After Cleansing: No Fibrino No Wound Bed Granulation Amount: Small (1-33%) Exposed Structure Granulation Quality: Red Fascia Exposed: No Necrotic Amount: Large (67-100%) Fat Layer (Subcutaneous Tissue) Exposed: No Necrotic Quality: Eschar, Adherent Slough Tendon Exposed: No Muscle Exposed:  No Joint Exposed: No Bone Exposed: No Bobeck, Dominyck ([id number]) Limited to Skin Breakdown Treatment Notes Wound #5 (Right, Anterior Lower Leg) Notes santyl to slough covered areas, xeroform over entire wound and abd/kerix Electronic Signature(s) Signed: 10/18/2018 11:23:15 AM By: Army Melia Entered By: Army Melia on 10/18/2018 09:08:51 Johnny [name] ([id number]) -------------------------------------------------------------------------------- Vitals Details Patient Name: Johnny [name] Date of Service: 10/18/2018 9:15 AM Medical Record Number: [id number] Patient Account Number: [id number] Date of Birth/Sex: 02-23-1951 (67 y.o. M) Treating RN: Army Melia Primary Care Latrell Reitan: Kingsley Spittle Other Clinician: Referring Teron Blais: Kingsley Spittle Treating Jonie Burdell/Extender: Melburn Hake, HOYT Weeks in Treatment: 1 Vital Signs Time Taken: 09:00 Temperature (F): 98.3 Height (in): 68 Pulse (bpm): 110 Weight (lbs): 154 Respiratory Rate (breaths/min): 16 Body Mass Index (BMI): 23.4 Blood Pressure (mmHg): 148/62 Reference Range: 80 - 120 mg / dl Electronic Signature(s) Signed: 10/18/2018 11:23:15 AM By: Army Melia Entered By: Army Melia on 10/18/2018 09:01:08

## 2018-10-22 NOTE — ED Notes (Addendum)
ED TO INPATIENT HANDOFF REPORT  ED Nurse Name and Phone #:  Elmo Putt #[phone]  S Name/Age/Gender Johnny [name]. 68 y.o. male Room/Bed: ED15A/ED15A  Code Status   Code Status: Full Code  Home/SNF/Other Home Patient oriented to: self, place, time and situation Is this baseline? Yes   Triage Complete: Triage complete  Chief Complaint leg pain ems  Triage Note Pt presents to ED via [facility] from home c/o infection to R lower leg. Pt states wound has been present now for 2 yrs and PCP told him to come to ED today. No hx DM. Pt is M/W/F dialysis patient, working fistula to R arm, old fistula to L arm.    Allergies Allergies  Allergen Reactions  . Shellfish Allergy Anaphylaxis    Level of Care/Admitting Diagnosis ED Disposition    ED Disposition Condition [facility]: [place] [[id number]]  Level of Care: Med-Surg [16]  Covid Evaluation: N/A  Diagnosis: Leg ulcer, right, with fat layer exposed [facility]) [[id number]]  Admitting Physician: Hillary Bow [[id number]]  Attending Physician: Hillary Bow [[id number]]  Estimated length of stay: past midnight tomorrow  Certification:: I certify this patient will need inpatient services for at least 2 midnights  PT Class (Do Not Modify): Inpatient [101]  PT Acc Code (Do Not Modify): Private [1]       B Medical/Surgery History Past Medical History:  Diagnosis Date  . Anemia   . Anxiety   . CHF (congestive heart failure) ([place])   . Chronic kidney disease    esrd dialysis m/w/f  . Gout   . Hyperlipidemia   . Hypertension   . Myocardial infarction ([place]) 2010   10 years ago  . 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  . Peripheral vascular disease [facility])    Past Surgical History:  Procedure Laterality Date  . A/V FISTULAGRAM Right 09/06/2018   Procedure: A/V FISTULAGRAM;  Surgeon: Algernon Huxley, MD;  Location: [place] [facility];  Service: Cardiovascular;   Laterality: Right;  . A/V SHUNTOGRAM Left 06/21/2017   Procedure: A/V SHUNTOGRAM;  Surgeon: Katha Cabal, MD;  Location: [place] [facility];  Service: Cardiovascular;  Laterality: Left;  . APPLICATION OF WOUND VAC Right 04/11/2018   Procedure: APPLICATION OF WOUND VAC;  Surgeon: Algernon Huxley, MD;  Location: [facility] ORS;  Service: Vascular;  Laterality: Right;  . AV FISTULA PLACEMENT Left 09/18/2015   Procedure: INSERTION OF ARTERIOVENOUS (AV) GORE-TEX GRAFT ARM ( BRACH/AXILLARY GRAFT W/ INSTANT STICK GRAFT );  Surgeon: Katha Cabal, MD;  Location: [facility] ORS;  Service: Vascular;  Laterality: Left;  . AV FISTULA PLACEMENT Right 07/19/2018   Procedure: INSERTION OF ARTERIOVENOUS (AV) GORE-TEX GRAFT ARM ( BRACHIAL AXILLARY);  Surgeon: Algernon Huxley, MD;  Location: [facility] ORS;  Service: Vascular;  Laterality: Right;  . DIALYSIS FISTULA CREATION Right 10/2017   right chest perm cath  . DIALYSIS/PERMA CATHETER REMOVAL N/A 09/13/2018   Procedure: DIALYSIS/PERMA CATHETER REMOVAL;  Surgeon: Algernon Huxley, MD;  Location: [place] [facility];  Service: Cardiovascular;  Laterality: N/A;  . ESOPHAGOGASTRODUODENOSCOPY N/A 12/19/2017   Procedure: ESOPHAGOGASTRODUODENOSCOPY (EGD);  Surgeon: Lin Landsman, MD;  Location[facility] ENDOSCOPY;  Service: Gastroenterology;  Laterality: N/A;  . LOWER EXTREMITY ANGIOGRAPHY Left 11/16/2017   Procedure: LOWER EXTREMITY ANGIOGRAPHY;  Surgeon: Algernon Huxley, MD;  Location: [place] [facility];  Service: Cardiovascular;  Laterality: Left;  . LOWER EXTREMITY ANGIOGRAPHY Right 01/18/2018  Procedure: LOWER EXTREMITY ANGIOGRAPHY;  Surgeon: Algernon Huxley, MD;  Location: [place] [facility];  Service: Cardiovascular;  Laterality: Right;  . LOWER EXTREMITY ANGIOGRAPHY Left 04/02/2018   Procedure: LOWER EXTREMITY ANGIOGRAPHY;  Surgeon: Algernon Huxley, MD;  Location: [place] [facility];  Service: Cardiovascular;  Laterality: Left;  . LOWER EXTREMITY ANGIOGRAPHY Right 04/09/2018    Procedure: Lower Extremity Angiography with possible intervention;  Surgeon: Algernon Huxley, MD;  Location: [place] [facility];  Service: Cardiovascular;  Laterality: Right;  . LOWER EXTREMITY ANGIOGRAPHY Right 07/23/2018   Procedure: Lower Extremity Angiography;  Surgeon: Algernon Huxley, MD;  Location: [place] [facility];  Service: Cardiovascular;  Laterality: Right;  . LOWER EXTREMITY ANGIOGRAPHY Right 09/13/2018   Procedure: LOWER EXTREMITY ANGIOGRAPHY;  Surgeon: Algernon Huxley, MD;  Location: [place] [facility];  Service: Cardiovascular;  Laterality: Right;  . LOWER EXTREMITY VENOGRAPHY Right 09/13/2018   Procedure: LOWER EXTREMITY VENOGRAPHY;  Surgeon: Algernon Huxley, MD;  Location: [place] [facility];  Service: Cardiovascular;  Laterality: Right;  . PERIPHERAL VASCULAR CATHETERIZATION Left 09/01/2015   Procedure: A/V Shuntogram/Fistulagram;  Surgeon: Katha Cabal, MD;  Location: [place] [facility];  Service: Cardiovascular;  Laterality: Left;  . PERIPHERAL VASCULAR CATHETERIZATION N/A 09/30/2015   Procedure: A/V Shuntogram/Fistulagram with perm cathether removal;  Surgeon: Algernon Huxley, MD;  Location: [place] [facility];  Service: Cardiovascular;  Laterality: N/A;  . PERIPHERAL VASCULAR CATHETERIZATION Left 09/30/2015   Procedure: A/V Shunt Intervention;  Surgeon: Algernon Huxley, MD;  Location: [place] [facility];  Service: Cardiovascular;  Laterality: Left;  . PERIPHERAL VASCULAR CATHETERIZATION Left 12/03/2015   Procedure: Thrombectomy;  Surgeon: Algernon Huxley, MD;  Location: [place] [facility];  Service: Cardiovascular;  Laterality: Left;  . PERIPHERAL VASCULAR CATHETERIZATION Left 01/28/2016   Procedure: Thrombectomy;  Surgeon: Algernon Huxley, MD;  Location: [place] [facility];  Service: Cardiovascular;  Laterality: Left;  . PERIPHERAL VASCULAR CATHETERIZATION N/A 01/28/2016   Procedure: A/V Shuntogram/Fistulagram;  Surgeon: Algernon Huxley, MD;  Location: [place] [facility];  Service:  Cardiovascular;  Laterality: N/A;  . SKIN SPLIT GRAFT Right 05/24/2018   Procedure: SKIN GRAFT SPLIT THICKNESS ( RIGHT CALF);  Surgeon: Algernon Huxley, MD;  Location: [facility] ORS;  Service: Vascular;  Laterality: Right;  . WOUND DEBRIDEMENT Right 04/11/2018   Procedure: DEBRIDEMENT WOUND calf muscle and skin;  Surgeon: Algernon Huxley, MD;  Location: [facility] ORS;  Service: Vascular;  Laterality: Right;     A IV Location/Drains/Wounds Patient Lines/Drains/Airways Status   Active Line/Drains/Airways    Name:   Placement date:   Placement time:   Site:   Days:   Fistula / Graft Right Upper arm Arteriovenous vein graft   07/19/18    1324    Upper arm   95   Hemodialysis Catheter Right Subclavian Double-lumen   -    -    Subclavian      Sheath Left Arterial;Femoral   -    -    Arterial;Femoral      Sheath 09/13/18   09/13/18    1117    -   39   Incision (Closed) 07/19/18 Arm Right   07/19/18    1303     95   Pressure Injury 04/05/18 Stage II -  Partial thickness loss of dermis presenting as a shallow open ulcer with a red, pink wound bed without slough. pink with dead skin surrounding injury  04/05/18    2202     200   Wound / Incision (Open or Dehisced) 07/22/18 Other (Comment) Leg Distal;Right;Posterior ulcer/cellulitis   07/22/18    -    Leg   92          Intake/Output Last 24 hours No intake or output data in the 24 hours ending 10/22/18 1801  Labs/Imaging No results found for this or any previous visit (from the past 17 hour(s)). US Venous Img Lower Unilateral Right  Result Date: 10/22/2018 CLINICAL DATA:  68 year old male with a history EXAM: RIGHT LOWER EXTREMITY VENOUS DOPPLER ULTRASOUND TECHNIQUE: Gray-scale sonography with graded compression, as well as color Doppler and duplex ultrasound were performed to evaluate the lower extremity deep venous systems from the level of the common femoral vein and including the common femoral, femoral, profunda femoral, popliteal and calf veins including  the posterior tibial, peroneal and gastrocnemius veins when visible. The superficial great saphenous vein was also interrogated. Spectral Doppler was utilized to evaluate flow at rest and with distal augmentation maneuvers in the common femoral, femoral and popliteal veins. COMPARISON:  None. FINDINGS: Contralateral Common Femoral Vein: Respiratory phasicity is normal and symmetric with the symptomatic side. No evidence of thrombus. Normal compressibility. Common Femoral Vein: No evidence of thrombus. Normal compressibility, respiratory phasicity and response to augmentation. Saphenofemoral Junction: No evidence of thrombus. Normal compressibility and flow on color Doppler imaging. Profunda Femoral Vein: No evidence of thrombus. Normal compressibility and flow on color Doppler imaging. Femoral Vein: No evidence of thrombus. Normal compressibility, respiratory phasicity and response to augmentation. Popliteal Vein: No evidence of thrombus. Normal compressibility, respiratory phasicity and response to augmentation. Calf Veins: Posterior tibial vein and peroneal vein not well visualized. Superficial Great Saphenous Vein: No evidence of thrombus. Normal compressibility and flow on color Doppler imaging. Other Findings: Edema. Lymph nodes of the inguinal region with typical architecture maintained, borderline enlarged. Vascular atherosclerosis incidentally imaged. IMPRESSION: Sonographic survey of the right lower extremity negative for DVT. Edema. Lymph nodes of the inguinal region, most likely reactive. Electronically Signed   By: Corrie Mckusick D.O.   On: 10/22/2018 16:30   US Venous Img Upper Uni Right  Result Date: 10/22/2018 CLINICAL DATA:  Right upper extremity pain and edema for the past 4-5 days. History upper extremity dialysis AV fistula. EXAM: RIGHT UPPER EXTREMITY VENOUS DOPPLER ULTRASOUND TECHNIQUE: Gray-scale sonography with graded compression, as well as color Doppler and duplex ultrasound were  performed to evaluate the upper extremity deep venous system from the level of the subclavian vein and including the jugular, axillary, basilic, radial, ulnar and upper cephalic vein. Spectral Doppler was utilized to evaluate flow at rest and with distal augmentation maneuvers. COMPARISON:  None. FINDINGS: Contralateral Subclavian Vein: Respiratory phasicity is normal and symmetric with the symptomatic side. No evidence of thrombus. Normal compressibility. Internal Jugular Vein: No evidence of thrombus. Normal compressibility, respiratory phasicity and response to augmentation. Subclavian Vein: No evidence of thrombus. Normal compressibility, respiratory phasicity and response to augmentation. Axillary Vein: No evidence of thrombus. Normal compressibility, respiratory phasicity and response to augmentation. Cephalic Vein: No evidence of thrombus. Normal compressibility, respiratory phasicity and response to augmentation. Basilic Vein: No evidence of thrombus. Normal compressibility, respiratory phasicity and response to augmentation. Brachial Veins: No evidence of thrombus. Normal compressibility, respiratory phasicity and response to augmentation. Radial Veins: No evidence of thrombus. Normal compressibility, respiratory phasicity and response to augmentation. Ulnar Veins: No evidence of thrombus. Normal compressibility, respiratory phasicity and response to augmentation. Venous  Reflux:  None visualized. Other Findings:  None visualized. IMPRESSION: No evidence of DVT within the right upper extremity. Electronically Signed   By: Sandi Mariscal M.D.   On: 10/22/2018 16:29   [name] Ekg Site Rite  Result Date: 10/22/2018 If Site Rite image not attached, placement could not be confirmed due to current cardiac rhythm.   Pending Labs Unresulted Labs (From admission, onward)    Start     Ordered   10/23/18 0277  Basic metabolic panel  Tomorrow morning,   STAT     10/22/18 1730   10/23/18 0500  CBC  Tomorrow morning,    STAT     10/22/18 1730   10/22/18 1552  Novel Coronavirus,NAA,(SEND-OUT TO REF LAB - TAT 24-48 hrs); Hosp Order  (Asymptomatic Patients Labs)  ONCE - STAT,   STAT    Question:  Rule Out  Answer:  Yes   10/22/18 1551   10/22/18 4128  Basic metabolic panel  ONCE - STAT,   STAT     10/22/18 1448   10/22/18 1446  Lactic acid, plasma  Now then every 2 hours,   STAT     10/22/18 1448   10/22/18 1446  CBC WITH DIFFERENTIAL  ONCE - STAT,   STAT     10/22/18 1448   10/22/18 1446  Protime-INR  ONCE - STAT,   STAT     10/22/18 1448   10/22/18 1446  Blood Culture (routine x 2)  BLOOD CULTURE X 2,   STAT     10/22/18 1448          Vitals/Pain Today's Vitals   10/22/18 1500 10/22/18 1654 10/22/18 1700 10/22/18 1723  BP:  (!) 170/90 (!) 199/93   Pulse:   (!) 110   Resp:  (!) 25 (!) 25   Temp:      TempSrc:      SpO2:   99%   Weight:      Height:      PainSc: 5    4     Isolation Precautions No active isolations  Medications Medications  acetaminophen (TYLENOL) tablet 650 mg (has no administration in time range)    Or  acetaminophen (TYLENOL) suppository 650 mg (has no administration in time range)  polyethylene glycol (MIRALAX / GLYCOLAX) packet 17 g (has no administration in time range)  ondansetron (ZOFRAN) tablet 4 mg (has no administration in time range)    Or  ondansetron (ZOFRAN) injection 4 mg (has no administration in time range)  albuterol (PROVENTIL) (2.5 MG/3ML) 0.083% nebulizer solution 2.5 mg (has no administration in time range)  metoprolol tartrate (LOPRESSOR) tablet 25 mg (has no administration in time range)  losartan (COZAAR) tablet 100 mg (has no administration in time range)  hydrALAZINE (APRESOLINE) injection 10 mg (has no administration in time range)  heparin injection 5,000 Units (has no administration in time range)  morphine 4 MG/ML injection 4 mg (4 mg Intramuscular Given 10/22/18 1653)    Mobility walks with person assist Low fall risk   Focused  Assessments Cardiac Assessment Handoff:    Lab Results  Component Value Date   CKTOTAL 77 06/20/2011   CKMB 0.9 06/20/2011   TROPONINI 0.05 ([place]) 04/04/2018   No results found for: DDIMER Does the Patient currently have chest pain? No  , Pulmonary Assessment Handoff:  Lung sounds:  clear bil O2 Device: Room Air   Current fistula R arm, used today. Old non-working fistula to L arm.  Pt has had  PIV attempts by 4 ED RNs and x1 IV team with no success. PICC line ordered by [facility].   R Recommendations: See Admitting Provider Note  Report given to:   Additional Notes:

## 2018-10-22 NOTE — ED Notes (Signed)
Unable to obtain blood, attempted by 2 RNs. IV team consult placed.

## 2018-10-22 NOTE — ED Notes (Addendum)
Attempted to locate pedal pulse with doppler to R foot. Unable to find pulse. 2nd [place] also attempted, also unable to find. [name] tech in room during doppler attempts. [name] tech checked for arterial blood flow and found arteries to be calcified with no flow. Pt has good arterial flow in groin from femoral artery that gradually decreases to slight trickle approximately mid-thigh and stops completely before reaching pt's knee. EDP [place] notified.

## 2018-10-22 NOTE — Progress Notes (Signed)
Advance care planning  Purpose of Encounter RLE wound and PAD  Parties in Attendance Patient  Patients Decisional capacity No documented healthcare power of attorney.  He tells me his friend [name] would make decisions if he is unable to.  No ACP documents found.  Discussed in detail regarding RLE wound and PAD.  Treatment plan , prognosis discussed.  All questions answered  CODE STATUS discussed and patient would like aggressive medical care along with defibrillation/CPR/intubation if needed  Orders entered and CODE STATUS changed  FULL CODE  Time spent - 17 minutes

## 2018-10-22 NOTE — Progress Notes (Signed)
Johnny [name] ([id number]) Visit Report for 10/18/2018 Chief Complaint Document Details Patient Name: Johnny [name], Johnny [name] Date of Service: 10/18/2018 9:15 AM Medical Record Number: [id number] Patient Account Number: [id number] Date of Birth/Sex: 05/28/1950 (68 y.o. M) Treating RN: Johnny [name] Primary Care Provider: Kingsley [name] Other Clinician: Referring Provider: Kingsley [name] Treating Provider/Extender: Johnny [name] Weeks in Treatment: 1 Information Obtained from: Patient Chief Complaint Right LE Ulcers Electronic Signature(s) Signed: 10/22/2018 12:02:24 PM By: Johnny Keeler PA-C Entered By: Johnny [name] on 10/18/2018 09:19:27 Johnny [name] ([id number]) -------------------------------------------------------------------------------- Debridement Details Patient Name: Johnny [name] Date of Service: 10/18/2018 9:15 AM Medical Record Number: [id number] Patient Account Number: [id number] Date of Birth/Sex: [birth year] (68 y.o. M) Treating RN: Johnny [name] Primary Care Provider: Kingsley [name] Other Clinician: Referring Provider: Kingsley [name] Treating Provider/Extender: Johnny [name] Weeks in Treatment: 1 Debridement Performed for Wound #3 Right,Posterior Lower Leg Assessment: Performed By: Clinician Johnny Hora, RN Debridement Type: Chemical/Enzymatic/Mechanical Agent Used: Santyl Severity of Tissue Pre Necrosis of muscle Debridement: Level of Consciousness (Pre- Awake and Alert procedure): Pre-procedure Verification/Time Yes - 09:43 Out Taken: Start Time: 09:43 Pain Control: Lidocaine 4% Topical Solution Instrument: Other : tongue blade Bleeding: None End Time: 09:45 Procedural Pain: 0 Post Procedural Pain: 0 Response to Treatment: Procedure was tolerated well Level of Consciousness Awake and Alert (Post-procedure): Post Debridement Measurements of Total Wound Length: (cm) 18 Width: (cm) 22 Depth: (cm) 0.3 Volume: (cm) 93.305 Character of  Wound/Ulcer Post Debridement: Improved Severity of Tissue Post Debridement: Necrosis of muscle Post Procedure Diagnosis Same as Pre-procedure Electronic Signature(s) Signed: 10/18/2018 3:09:24 PM By: Johnny [name] Signed: 10/22/2018 12:02:24 PM By: Johnny Keeler PA-C Entered By: Johnny [name] on 10/18/2018 09:43:50 Johnny [name] ([id number]) -------------------------------------------------------------------------------- HPI Details Patient Name: Johnny [name] Date of Service: 10/18/2018 9:15 AM Medical Record Number: [id number] Patient Account Number: [id number] Date of Birth/Sex: Jun 18, 1950 (68 y.o. M) Treating RN: Johnny [name] Primary Care Provider: Kingsley [name] Other Clinician: Referring Provider: Kingsley [name] Treating Provider/Extender: Johnny [name] Weeks in Treatment: 1 History of Present Illness HPI Description: 03/20/18 patient presents today for initial evaluation and our clinic as result of issues that he has been having with his lower extremities. With that being said he actually is a patient of Johnny [name] at [facility] as well. Subsequently he is actually scheduled for surgery with Johnny [name] next week in regard to his right lower extremity where he does require and need a significant debridement of the posterior/lateral aspect of his leg. For that reason we really are not doing anything today to aid in this regard with treatment and are not establishing care for the right lower extremity since is gonna be undergoing debridement and is under the care of another physician for this. Subsequently part of the surgery on Wednesday is post involve the right fifth toe for amputation. With that being said we did look back in epic and I do not see any evidence of x-rays that would indicate osteomyelitis at the site. For that reason I did actually get in touch with Johnny [name] office and spoke with Johnny [name] the mid-level provider there. Subsequently, it does appear  that the fifth toe is scheduled for invitation. With that being said I explained that on my evaluation today it did not appear that the patient has any evidence of infection at the site and there was no suggestion of gangrene at this time. The toenail was somewhat damaged and likely needs to be trimmed by podiatry. With that being  said I do not see any evidence that this likely is going to require invitation unless there is further documentation of osteomyelitis or something worse that I have not seen at this point. She appreciated the information being relayed and states that she will follow-up with Johnny [name] in order to check and see what they need to do as far as reevaluating the patient to ensure that nothing needs to be done for that toe was for this application is concerned. I discussed this with the patient today although I have not spoken with Johnny [name] office at the time of the appointment this was later in the day. With that being said the patient does have an extensive vascular history including having had a most recent TBI examination on 02/27/18. The revealed that the patient had biphasic waveforms bilaterally in the foot along with a TBI of 0.93 on the right and a TBI of 0.56 on the left. This was obviously good news. His TBI on the left is a bit lower but still sounds to be better than what it was previous when he was being evaluated by Johnny [name]. Again that was prior to the stenting. According to the records that were reviewed from the patient's primary care provider and following the history there it appears that the patient has been under the chair of Johnny [name] for a number of years even at some point has seen Johnny [name] as well. He also sees Johnny [name] at the [place] center. It does appear that she recently has had the patient on doxycycline due to what was felt to be an infection. The patient was referred to wound care as things were becoming more  complicated than what Johnny [name] felt could be handled at her clinic. Lastly upon reviewing some of the notes through epic and the patient's appointments at the emergency department I could not find anything that indicated an x-ray of his right foot at any point in the recent past. All the x-rays actually pointed to the left foot one was November 27, 2017 subsequently this was repeated on 02/28/18. Subsequently this shows that there was subtle osteolysis at the medial head of the fourth proximal phalanx which was suspicious for acute osteomyelitis and was not noted in the July radiographs. The patient does have no other osseous abnormality identified. With that being said I do not see any of the notes reviewed were this has been addressed in particular other than at the emergency department. No MRI has been ordered at this time. Readmission: 10/11/18 upon evaluation today patient actually has evidence of significant necrotic tissue noted over his right lower extremity. This is pretty much a circumferential issue. With that being said it does appear that there is muscle exposed and then muscle is necrotic at this point. He is been a patient of Johnny [name] for some time now since I initially saw him back in November 2019. Obviously he's had a lot going on in the interim although it doesn't look like there's been a significant debridement. He most recently did have a arterial study performed on 08/31/18. 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 This was an improvement from the previous TBI of the left which was 0.59. Nonetheless he did undergo an arteriogram on 09/13/18 and it was noted by Johnny [name] that his perfusion was adequate for wound healing and no revascularization was needed at that point. All this  is excellent news. With that being said the patient is gonna require some significant debridement in order to clear away some of the necrotic tissue at this point based  on what I'm seeing. No fevers, Johnny [name], Johnny [name] ([id number]) chills, nausea, or vomiting noted at this time. 10/18/18 on evaluation today patient appears to be doing excellent in regard to his lower extremity ulcer all things considering. He still has a significant area of ulceration with significant hyper granular tissue as well as some areas of [place] and aquatic debris on the surface of the wound. With that being said this still looks tremendously better than it did last week. Nonetheless I'm thinking that he may need to see a plastic surgeon for evaluation and treatment of this ulceration. Electronic Signature(s) Signed: 10/22/2018 12:02:24 PM By: Johnny Keeler PA-C Entered By: Johnny [name] on 10/18/2018 23:20:04 Johnny [name], Johnny [name] ([id number]) -------------------------------------------------------------------------------- Physical Exam Details Patient Name: Johnny [name] Date of Service: 10/18/2018 9:15 AM Medical Record Number: [id number] Patient Account Number: [id number] Date of Birth/Sex: 1950/09/15 (67 y.o. M) Treating RN: Johnny [name] Primary Care Provider: Kingsley [name] Other Clinician: Referring Provider: Kingsley [name] Treating Provider/Extender: STONE III, [name] Weeks in Treatment: 1 Constitutional Well-nourished and well-hydrated in no acute distress. Respiratory normal breathing without difficulty. clear to auscultation bilaterally. Cardiovascular regular rate and rhythm with normal S1, S2. Psychiatric this patient is able to make decisions and demonstrates good insight into disease process. Alert and Oriented x 3. pleasant and cooperative. Notes Patient's wound bed today did have some necrotic tissue noted including necrotic muscle but at this point no sharp debridement was performed as again I think that the necrotic tissue is a little bit to adherent and subsequently I believe that utilizing Santyl will likely loosen this up in order for me to be able to clean it  off more effectively in the future. We are therefore gonna go that route. Electronic Signature(s) Signed: 10/22/2018 12:02:24 PM By: Johnny Keeler PA-C Entered By: Johnny [name] on 10/18/2018 23:20:43 Johnny [name], Johnny [name] ([id number]) -------------------------------------------------------------------------------- Physician Orders Details Patient Name: Johnny [name] Date of Service: 10/18/2018 9:15 AM Medical Record Number: [id number] Patient Account Number: [id number] Date of Birth/Sex: June 12, 1950 (67 y.o. 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 Wound #4 Right,Lateral Calcaneus o Clean wound with Normal Saline. Wound #5 Right,Anterior Lower Leg o Clean wound with Normal Saline. Anesthetic (add to Medication List) Wound #3 Right,Posterior Lower Leg o Topical Lidocaine 4% cream applied to wound bed prior to debridement (In Clinic Only). Wound #4 Right,Lateral Calcaneus o Topical Lidocaine 4% cream applied to wound bed prior to debridement (In Clinic Only). Wound #5 Right,Anterior Lower Leg o Topical Lidocaine 4% cream applied to wound bed prior to debridement (In Clinic Only). Primary Wound Dressing Wound #3 Right,Posterior Lower Leg o Santyl Ointment - to slough covered  areas o Xeroform - cover entire wound with xeroform over the santyl Wound #4  Right,Lateral Calcaneus o Santyl Ointment - to slough covered areas o Xeroform - cover entire wound with xeroform over the santyl Wound #5 Right,Anterior Lower Leg o Xeroform Pressly, Teron ([id number]) Secondary Dressing Wound #3 Right,Posterior Lower Leg o ABD and Kerlix/Conform Wound #4 Right,Lateral Calcaneus o ABD and Kerlix/Conform Wound #5 Right,Anterior Lower Leg o ABD and Kerlix/Conform Dressing Change Frequency Wound #3 Right,Posterior Lower Leg o Three times weekly - Tuesdays and Saturdays or Sundays by [facility] and Thursdays at [place] Wound #4 Right,Lateral Calcaneus o Three times weekly - Tuesdays and Saturdays or Sundays by [facility] and Thursdays at [place] Wound #5 Right,Anterior Lower Leg o Three times weekly - Tuesdays and Saturdays or Sundays by [facility] and Thursdays at [place] Follow-up Appointments o Return Appointment in 1 week. Home Health Wound #3 [place] Visits - [place] to visit patient on Tuesdays and Saturdays or Sundays o Home Health Nurse may visit PRN to address patientos wound care needs. o FACE TO FACE ENCOUNTER: MEDICARE and MEDICAID PATIENTS: I certify that this patient is under my care and that I had a face-to-face encounter that meets the physician face-to-face encounter requirements with this patient on this date. The encounter with the patient was in whole or in part for the following MEDICAL CONDITION: (primary reason for [place]) MEDICAL NECESSITY: I certify, that based on my findings, NURSING services are a medically necessary home health service. HOME BOUND STATUS: I certify that my clinical findings support that this patient is homebound (i.e., Due to illness or injury, pt requires aid of supportive devices such as crutches, cane,  wheelchairs, walkers, the use of special transportation or the assistance of another person to leave their place of residence. There is a normal inability to leave the home and doing so requires considerable and taxing effort. Other absences are for medical reasons / religious services and are infrequent or of short duration when for other reasons). o If current dressing causes regression in wound condition, may D/C ordered dressing product/s and apply Normal Saline Moist Dressing daily until next [place] / Other MD appointment. [place] of regression in wound condition at [phone]. o Please direct any NON-WOUND related issues/requests for orders to patient's Primary Care Physician Wound #4 [facility] Visits - [place] to visit patient on Tuesdays and Saturdays or Sundays o Home Health Nurse may visit PRN to address patientos wound care needs. o FACE TO FACE ENCOUNTER: MEDICARE and MEDICAID PATIENTS: I certify that this patient is under my care and that I had a face-to-face encounter that meets the physician face-to-face encounter requirements with this patient on this date. The encounter with the patient was in whole or in part for the following MEDICAL CONDITION: (primary reason for [place]) MEDICAL NECESSITY: I certify, that based on my findings, NURSING services are a medically necessary home health service. HOME BOUND STATUS: I certify that my clinical findings support that this patient is homebound (i.e., Due to illness or injury, pt requires aid of supportive devices such as crutches, cane, wheelchairs, walkers, the use of special transportation or the assistance of another person to leave their place of residence. There is a normal inability to leave the home and doing so requires considerable and taxing effort. Other absences are for medical reasons / religious services and are infrequent or  of short duration when for other reasons). o  If current dressing causes regression in wound condition, may D/C ordered dressing product/s and apply Normal Saline Moist Dressing daily until next [place] / Other MD appointment. [place] of regression in wound condition at [phone]. CHANNING, YEAGER ([id number]) o Please direct any NON-WOUND related issues/requests for orders to patient's Primary Care Physician Wound #5 [place] Visits - [place] to visit patient on Tuesdays and Saturdays or Sundays o Home Health Nurse may visit PRN to address patientos wound care needs. o FACE TO FACE ENCOUNTER: MEDICARE and MEDICAID PATIENTS: I certify that this patient is under my care and that I had a face-to-face encounter that meets the physician face-to-face encounter requirements with this patient on this date. The encounter with the patient was in whole or in part for the following MEDICAL CONDITION: (primary reason for [place]) MEDICAL NECESSITY: I certify, that based on my findings, NURSING services are a medically necessary home health service. HOME BOUND STATUS: I certify that my clinical findings support that this patient is homebound (i.e., Due to illness or injury, pt requires aid of supportive devices such as crutches, cane, wheelchairs, walkers, the use of special transportation or the assistance of another person to leave their place of residence. There is a normal inability to leave the home and doing so requires considerable and taxing effort. Other absences are for medical reasons / religious services and are infrequent or of short duration when for other reasons). o If current dressing causes regression in wound condition, may D/C ordered dressing product/s and apply Normal Saline Moist Dressing daily until next [place] / Other MD appointment. [place] of  regression in wound condition at [phone]. o Please direct any NON-WOUND related issues/requests for orders to patient's Primary Care Physician Consults o Plastic Surgery Patient Medications Allergies: shellfish derived Notifications Medication Indication Start End Santyl 10/18/2018 DOSE topical 250 unit/gram ointment - ointment topical applied nickel thick to the wound bed and then cover with a dressing as directed Electronic Signature(s) Signed: 10/18/2018 1:34:55 PM By: Johnny Keeler PA-C Entered By: Johnny [name] on 10/18/2018 13:34:54 Johnny [name] ([id number]) -------------------------------------------------------------------------------- Problem List Details Patient Name: Johnny [name] Date of Service: 10/18/2018 9:15 AM Medical Record Number: [id number] Patient Account Number: [id number] Date of Birth/Sex: Jul 23, 1950 (67 y.o. 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 Non-pressure chronic ulcer of right heel and midfoot with 10/11/2018 No Yes necrosis of muscle N18.6 End stage renal disease 10/11/2018 No Yes Z99.2 Dependence on renal dialysis 10/11/2018 No Yes I73.89 Other specified peripheral vascular diseases 10/11/2018 No Yes Z79.01 Long term (current) use of anticoagulants 10/11/2018 No Yes Inactive Problems Resolved Problems Electronic Signature(s) Signed: 10/22/2018 12:02:24 PM By: Johnny Keeler PA-C Entered By: Johnny [name] on 10/18/2018 09:19:21 Johnny [name]  ([id number]) -------------------------------------------------------------------------------- Progress Note Details Patient Name: Johnny [name] Date of Service: 10/18/2018 9:15 AM Medical Record Number: [id number] Patient Account Number: [id number] Date of Birth/Sex: 02/26/1951 (67 y.o. M) Treating RN: Johnny [name] Primary Care Provider: Kingsley [name] Other Clinician: Referring Provider: Kingsley [name] Treating Provider/Extender: Johnny [name] Weeks in Treatment: 1 Subjective  Chief Complaint Information obtained from Patient Right LE Ulcers History of Present Illness (HPI) 03/20/18 patient presents today for initial evaluation and our clinic as result of issues that he has been having with his lower extremities. With that being said he actually is a patient of Johnny [name] at [facility] as well. Subsequently he is actually scheduled for surgery with Johnny [name] next week in regard to his right lower extremity where he does require and need a significant debridement of the posterior/lateral aspect of his leg. For that reason we really are not doing anything today to aid in this regard with treatment and are not establishing care for the right lower extremity since is gonna be undergoing debridement and is under the care of another physician for this. Subsequently part of the surgery on Wednesday is post involve the right fifth toe for amputation. With that being said we did look back in epic and I do not see any evidence of x-rays that would indicate osteomyelitis at the site. For that reason I did actually get in touch with Johnny [name] office and spoke with Johnny [name] the mid-level provider there. Subsequently, it does appear that the fifth toe is scheduled for invitation. With that being said I explained that on my evaluation today it did not appear that the patient has any evidence of infection at the site and there was no suggestion of gangrene at this time. The toenail was  somewhat damaged and likely needs to be trimmed by podiatry. With that being said I do not see any evidence that this likely is going to require invitation unless there is further documentation of osteomyelitis or something worse that I have not seen at this point. She appreciated the information being relayed and states that she will follow-up with Johnny [name] in order to check and see what they need to do as far as reevaluating the patient to ensure that nothing needs to be done for that toe was for this application is concerned. I discussed this with the patient today although I have not spoken with Johnny [name] office at the time of the appointment this was later in the day. With that being said the patient does have an extensive vascular history including having had a most recent TBI examination on 02/27/18. The revealed that the patient had biphasic waveforms bilaterally in the foot along with a TBI of 0.93 on the right and a TBI of 0.56 on the left. This was obviously good news. His TBI on the left is a bit lower but still sounds to be better than what it was previous when he was being evaluated by Johnny [name]. Again that was prior to the stenting. According to the records that were reviewed from the patient's primary care provider and following the history there it appears that the patient has been under the chair of Johnny [name] for a number of years even at some point has seen Johnny [name] as well. He also sees Johnny [name] at the [place] center. It does appear that she recently has had the patient on doxycycline due to what was felt to be an infection. The patient was referred to wound care as things were becoming more complicated than what Johnny [name] felt could be handled at her clinic. Lastly upon reviewing some of the notes through epic and the patient's appointments at the emergency department I could not find anything that indicated an x-ray of his right foot at any point  in  the recent past. All the x-rays actually pointed to the left foot one was November 27, 2017 subsequently this was repeated on 02/28/18. Subsequently this shows that there was subtle osteolysis at the medial head of the fourth proximal phalanx which was suspicious for acute osteomyelitis and was not noted in the July radiographs. The patient does have no other osseous abnormality identified. With that being said I do not see any of the notes reviewed were this has been addressed in particular other than at the emergency department. No MRI has been ordered at this time. Readmission: 10/11/18 upon evaluation today patient actually has evidence of significant necrotic tissue noted over his right lower extremity. This is pretty much a circumferential issue. With that being said it does appear that there is muscle exposed and then muscle is necrotic at this point. He is been a patient of Johnny [name] for some time now since I initially saw him back in November 2019. Obviously he's had a lot going on in the interim although it doesn't look like there's been a significant debridement. He most Hamill, Enos ([id number]) recently did have a arterial study performed on 08/31/18. 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 This was an improvement from the previous TBI of the left which was 0.59. Nonetheless he did undergo an arteriogram on 09/13/18 and it was noted by Johnny [name] that his perfusion was adequate for wound healing and no revascularization was needed at that point. All this is excellent news. With that being said the patient is gonna require some significant debridement in order to clear away some of the necrotic tissue at this point based on what I'm seeing. No fevers, chills, nausea, or vomiting noted at this time. 10/18/18 on evaluation today patient appears to be doing excellent in regard to his lower extremity ulcer all things considering. He still has a significant  area of ulceration with significant hyper granular tissue as well as some areas of Slough and aquatic debris on the surface of the wound. With that being said this still looks tremendously better than it did last week. Nonetheless I'm thinking that he may need to see a plastic surgeon for evaluation and treatment of this ulceration. Patient History Information obtained from Patient. Family History Diabetes - Siblings,Mother, Hypertension - Siblings, Kidney Disease - Siblings,Mother, No family history of Cancer, Heart Disease, Lung Disease, Seizures, Stroke, Thyroid Problems, Tuberculosis. Social History Former smoker, Marital Status - Divorced, Alcohol Use - Never, Drug Use - No History, Caffeine Use - Rarely. Medical History Eyes Patient has history of Cataracts Denies history of Glaucoma, Optic Neuritis Ear/Nose/Mouth/Throat Denies history of Chronic sinus problems/congestion, Middle ear problems Hematologic/Lymphatic Denies history of Anemia, Hemophilia, Human Immunodeficiency Virus, Lymphedema, Sickle Cell Disease Respiratory Denies history of Aspiration, Asthma, Chronic Obstructive Pulmonary Disease (COPD), Pneumothorax, Sleep Apnea, Tuberculosis Cardiovascular Patient has history of Congestive Heart Failure, Deep Vein Thrombosis, Hypertension, Peripheral Venous Disease Denies history of Angina, Arrhythmia, Coronary Artery Disease, Hypotension, Myocardial Infarction, Peripheral Arterial Disease, Phlebitis, Vasculitis Gastrointestinal Denies history of Cirrhosis , Colitis, Crohn s, Hepatitis A, Hepatitis B, Hepatitis C Endocrine Patient has history of Type II Diabetes Denies history of Type I Diabetes Genitourinary Patient has history of 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 Immunological Denies history of Lupus Erythematosus, Raynaud s, Scleroderma Integumentary (Skin) Patient has history of History of pressure wounds Denies history of History of Burn Musculoskeletal Patient  has history of  Gout Denies history of Rheumatoid Arthritis, Osteoarthritis, Osteomyelitis Neurologic Patient has history of Neuropathy - right hand Denies history of Dementia, Quadriplegia, Paraplegia, Seizure Disorder Oncologic Denies history of Received Chemotherapy, Received Radiation Psychiatric Denies history of Anorexia/bulimia, Confinement Anxiety Pai, Tanav ([id number]) Review of Systems (ROS) Constitutional Symptoms (General Health) Denies complaints or symptoms of Fatigue, Fever, Chills, Marked Weight Change. Respiratory Denies complaints or symptoms of Chronic or frequent coughs, Shortness of Breath. Cardiovascular Complains or has symptoms of LE edema. Denies complaints or symptoms of Chest pain. Psychiatric Denies complaints or symptoms of Anxiety, Claustrophobia. Objective Constitutional Well-nourished and well-hydrated in no acute distress. Vitals Time Taken: 9:00 AM, Height: 68 in, Weight: 154 lbs, BMI: 23.4, Temperature: 98.3 F, Pulse: 110 bpm, Respiratory Rate: 16 breaths/min, Blood Pressure: 148/62 mmHg. Respiratory normal breathing without difficulty. clear to auscultation bilaterally. Cardiovascular regular rate and rhythm with normal S1, S2. Psychiatric this patient is able to make decisions and demonstrates good insight into disease process. Alert and Oriented x 3. pleasant and cooperative. General Notes: Patient's wound bed today did have some necrotic tissue noted including necrotic muscle but at this point no sharp debridement was performed as again I think that the necrotic tissue is a little bit to adherent and subsequently I believe that utilizing Santyl will likely loosen this up in order for me to be able to clean it off more effectively in the future. We are therefore gonna go that route. Integumentary (Hair, Skin) Wound #3 status is Open. Original cause of wound was Gradually Appeared. The wound is located on the Right,Posterior Lower Leg.  The wound measures 18cm length x 22cm width x 0.3cm depth; 311.018cm^2 area and 93.305cm^3 volume. There is tendon and Fat Layer (Subcutaneous Tissue) Exposed exposed. There is no tunneling or undermining noted. There is a large amount of purulent drainage noted. Foul odor after cleansing was noted. The wound margin is indistinct and nonvisible. There is medium (34-66%) hyper - granulation within the wound bed. There is a medium (34-66%) amount of necrotic tissue within the wound bed including Eschar and Adherent Slough. Wound #4 status is Open. Original cause of wound was Gradually Appeared. The wound is located on the Right,Lateral Calcaneus. The wound measures 4.7cm length x 2.7cm width x 0.4cm depth; 9.967cm^2 area and 3.987cm^3 volume. The wound is limited to skin breakdown. There is no tunneling or undermining noted. There is a medium amount of purulent drainage noted. The wound margin is flat and intact. There is no granulation within the wound bed. There is a large (67-100%) amount of necrotic tissue within the wound bed including Eschar and Adherent Slough. Johnny [name], Johnny [name] ([id number]) Wound #5 status is Open. Original cause of wound was Gradually Appeared. The wound is located on the Right,Anterior Lower Leg. The wound measures 5.5cm length x 5.5cm width x 0.2cm depth; 23.758cm^2 area and 4.752cm^3 volume. The wound is limited to skin breakdown. There is no tunneling or undermining noted. There is a small amount of purulent drainage noted. The wound margin is flat and intact. There is small (1-33%) red granulation within the wound bed. There is a large (67- 100%) amount of necrotic tissue within the wound bed including Eschar and Adherent Slough. 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 There was a Chemical/Enzymatic/Mechanical debridement performed by Johnny Hora, RN. With the following instrument(s): tongue blade after achieving pain control using Lidocaine 4% Topical Solution. Agent used was [employer]. A time out was conducted at 09:43, prior to the start of the procedure. There was no bleeding. The procedure was tolerated well with a pain level of 0 throughout and a pain level of 0 following the procedure. Post Debridement Measurements: 18cm length x 22cm width x 0.3cm depth; 93.305cm^3 volume. Character of Wound/Ulcer Post Debridement is improved. Severity of Tissue Post Debridement is: Necrosis of muscle. Post procedure Diagnosis Wound #3: Same as Pre-Procedure Plan Wound Cleansing: Wound #3 Right,Posterior Lower Leg: Clean wound with Normal Saline. Wound #4 Right,Lateral Calcaneus: Clean wound with Normal Saline. Wound #5 Right,Anterior Lower Leg: Clean wound with Normal Saline. Anesthetic (add to Medication List): Wound #3 Right,Posterior Lower Leg: Topical Lidocaine 4% cream applied to wound bed prior to debridement (In Clinic Only). Wound #4 Right,Lateral Calcaneus: Topical Lidocaine 4% cream applied to wound bed prior to debridement (In Clinic Only). Johnny [name], Johnny [name] ([id number]) Wound #5 Right,Anterior Lower Leg: Topical Lidocaine 4% cream applied to wound bed prior to debridement (In Clinic Only). Primary Wound Dressing: Wound #3 Right,Posterior Lower Leg: Santyl Ointment - to slough covered areas Xeroform - cover entire wound with xeroform over the santyl Wound #4 Right,Lateral Calcaneus: Santyl Ointment  - to slough covered areas Xeroform - cover entire wound with xeroform over the santyl Wound #5 Right,Anterior Lower Leg: Xeroform Secondary Dressing: Wound #3 Right,Posterior Lower Leg: ABD and Kerlix/Conform Wound #4 Right,Lateral Calcaneus: ABD and Kerlix/Conform Wound #5 Right,Anterior Lower Leg: ABD and Kerlix/Conform Dressing Change Frequency: Wound #3 Right,Posterior Lower Leg: Three times weekly - Tuesdays and Saturdays or Sundays by [facility] and Thursdays at [place] Wound #4 Right,Lateral Calcaneus: Three times weekly - Tuesdays and Saturdays or Sundays by [facility] and Thursdays at [place] Wound #5 Right,Anterior Lower Leg: Three times weekly - Tuesdays and Saturdays or Sundays by [facility] and Thursdays at [place] Follow-up Appointments: Return Appointment in 1 week. Home Health: Wound #3 Right,Posterior Lower Leg: Green Visits - [place] to visit patient on Tuesdays and Saturdays or Sundays Home Health Nurse may visit PRN to address patient s wound care needs. FACE TO FACE ENCOUNTER: MEDICARE and MEDICAID PATIENTS: I certify that this patient is under my care and that I had a face-to-face encounter that meets the physician face-to-face encounter requirements with this patient on this date. The encounter with the patient was in whole or in part for the following MEDICAL CONDITION: (primary reason for [place]) MEDICAL NECESSITY: I certify, that based on my findings, NURSING services are a medically necessary home health service. HOME BOUND STATUS: I certify that my clinical findings support that this patient is homebound (i.e., Due to illness or injury, pt requires aid of supportive devices such as crutches, cane, wheelchairs, walkers, the use of special transportation or the assistance of another person to leave their place of residence. There is a normal inability to leave the home and doing so requires  considerable and taxing effort. Other absences are for medical reasons / religious services and are infrequent or of short duration when for other reasons). If current dressing causes regression  in wound condition, may D/C ordered dressing product/s and apply Normal Saline Moist Dressing daily until next [place] / Other MD appointment. [place] of regression in wound condition at [phone]. Please direct any NON-WOUND related issues/requests for orders to patient's Primary Care Physician Wound #4 Right,Lateral Calcaneus: [place] to visit patient on Tuesdays and Saturdays or Sundays Home Health Nurse may visit PRN to address patient s wound care needs. FACE TO FACE ENCOUNTER: MEDICARE and MEDICAID PATIENTS: I certify that this patient is under my care and that I had a face-to-face encounter that meets the physician face-to-face encounter requirements with this patient on this date. The encounter with the patient was in whole or in part for the following MEDICAL CONDITION: (primary reason for [place]) MEDICAL NECESSITY: I certify, that based on my findings, NURSING services are a medically necessary home health service. HOME BOUND STATUS: I certify that my clinical findings support that this patient is homebound (i.e., Due to illness or injury, pt requires aid of supportive devices such as crutches, cane, wheelchairs, walkers, the use of special transportation or the assistance of another person to leave their place of residence. There is a normal inability to leave the home and doing so requires considerable and taxing effort. Other absences are for medical reasons / religious services and are infrequent or of short duration when for other reasons). If current dressing causes regression in wound condition, may D/C ordered dressing product/s and apply Normal Saline Moist Dressing daily until next [place] /  Other MD appointment. [place] of regression in wound condition at [phone]. Please direct any NON-WOUND related issues/requests for orders to patient's Primary Care Physician Johnny [name], Johnny [name] ([id number]) Wound #5 Right,Anterior Lower Leg: [place] to visit patient on Tuesdays and Saturdays or Sundays Home Health Nurse may visit PRN to address patient s wound care needs. FACE TO FACE ENCOUNTER: MEDICARE and MEDICAID PATIENTS: I certify that this patient is under my care and that I had a face-to-face encounter that meets the physician face-to-face encounter requirements with this patient on this date. The encounter with the patient was in whole or in part for the following MEDICAL CONDITION: (primary reason for [place]) MEDICAL NECESSITY: I certify, that based on my findings, NURSING services are a medically necessary home health service. HOME BOUND STATUS: I certify that my clinical findings support that this patient is homebound (i.e., Due to illness or injury, pt requires aid of supportive devices such as crutches, cane, wheelchairs, walkers, the use of special transportation or the assistance of another person to leave their place of residence. There is a normal inability to leave the home and doing so requires considerable and taxing effort. Other absences are for medical reasons / religious services and are infrequent or of short duration when for other reasons). If current dressing causes regression in wound condition, may D/C ordered dressing product/s and apply Normal Saline Moist Dressing daily until next [place] / Other MD appointment. [place] of regression in wound condition at [phone]. Please direct any NON-WOUND related issues/requests for orders to patient's Primary Care Physician Consults ordered were: Plastic Surgery The following medication(s) was prescribed: Santyl topical  250 unit/gram ointment ointment topical applied nickel thick to the wound bed and then cover with a dressing as directed starting 10/18/2018 I really believe that the patient may need to see plastic surgery  as soon as possible to see if there any options they would offer for treatment possibly more complete surgical debridement and then skin grafting. With that being said I do not know that something that they would recommend or not will have to wait and see what they say once we are able to get him into plastic surgery. He's in agreement with going that route. If anything changes worsens meantime he will contact the office and let me know. Please see above for specific wound care orders. We will see patient for re-evaluation in 1 week(s) here in the clinic. If anything worsens or changes patient will contact our office for additional recommendations. Electronic Signature(s) Signed: 10/22/2018 12:02:24 PM By: Johnny Keeler PA-C Entered By: Johnny [name] on 10/18/2018 23:21:02 Johnny [name], Johnny [name] ([id number]) -------------------------------------------------------------------------------- ROS/PFSH Details Patient Name: Johnny [name] Date of Service: 10/18/2018 9:15 AM Medical Record Number: [id number] Patient Account Number: [id number] Date of Birth/Sex: 04-Mar-1951 (67 y.o. M) Treating RN: Johnny [name] Primary Care Provider: Kingsley [name] Other Clinician: Referring Provider: Kingsley [name] Treating Provider/Extender: Johnny [name] Weeks in Treatment: 1 Information Obtained From Patient Constitutional Symptoms (General Health) Complaints and Symptoms: Negative for: Fatigue; Fever; Chills; Marked Weight Change Respiratory Complaints and Symptoms: Negative for: Chronic or frequent coughs; Shortness of Breath Medical History: Negative for: Aspiration; Asthma; Chronic Obstructive Pulmonary Disease (COPD); Pneumothorax; Sleep Apnea; Tuberculosis Cardiovascular Complaints and  Symptoms: Positive for: LE edema Negative for: Chest pain Medical History: Positive for: Congestive Heart Failure; Deep Vein Thrombosis; Hypertension; Peripheral Venous Disease Negative for: Angina; Arrhythmia; Coronary Artery Disease; Hypotension; Myocardial Infarction; Peripheral Arterial Disease; Phlebitis; Vasculitis Psychiatric Complaints and Symptoms: Negative for: Anxiety; Claustrophobia Medical History: Negative for: Anorexia/bulimia; Confinement Anxiety Eyes Medical History: Positive for: Cataracts Negative for: Glaucoma; Optic Neuritis Ear/Nose/Mouth/Throat Medical History: Negative for: Chronic sinus problems/congestion; Middle ear problems Hematologic/Lymphatic Johnny [name], Johnny [name] ([id number]) Medical History: Negative for: Anemia; Hemophilia; Human Immunodeficiency Virus; Lymphedema; Sickle Cell Disease Gastrointestinal Medical History: Negative for: Cirrhosis ; Colitis; Crohnos; Hepatitis A; Hepatitis B; Hepatitis C Endocrine Medical History: Positive for: Type II Diabetes Negative for: Type I Diabetes Treated with: Oral agents Genitourinary Medical History: 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 Immunological Medical History: Negative for: Lupus Erythematosus; Raynaudos; Scleroderma Integumentary (Skin) Medical History: Positive for: History of pressure wounds Negative for: History of Burn Musculoskeletal Medical History: Positive for: Gout Negative for: Rheumatoid Arthritis; Osteoarthritis; Osteomyelitis Neurologic Medical History: Positive for: Neuropathy - right hand Negative for: Dementia; Quadriplegia; Paraplegia; Seizure Disorder Oncologic Medical History: Negative for: Received Chemotherapy; Received Radiation HBO Extended History Items Eyes: Cataracts Immunizations Pneumococcal Vaccine: Received Pneumococcal Vaccination: Yes Implantable Devices Johnny [name], Johnny [name] ([id number]) None Family and Social History Cancer: No; Diabetes:  Yes - Siblings,Mother; Heart Disease: No; Hypertension: Yes - Siblings; Kidney Disease: Yes - Siblings,Mother; Lung Disease: No; Seizures: No; Stroke: No; Thyroid Problems: No; Tuberculosis: No; Former smoker; Marital Status - Divorced; Alcohol Use: Never; Drug Use: No History; Caffeine Use: Rarely Physician Affirmation I have reviewed and agree with the above information. Electronic Signature(s) Signed: 10/19/2018 5:04:52 PM By: Johnny [name] Signed: 10/22/2018 12:02:24 PM By: Johnny Keeler PA-C Entered By: Johnny [name] on 10/18/2018 23:20:31 Johnny [name], Johnny [name] ([id number]) -------------------------------------------------------------------------------- SuperBill Details Patient Name: Johnny [name] Date of Service: 10/18/2018 Medical Record Number: [id number] Patient Account Number: [id number] Date of Birth/Sex: May 28, 1950 (67 y.o. 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 N18.6 End stage renal disease Z99.2 Dependence on renal dialysis I73.89 Other specified peripheral vascular diseases Z79.01 Long term (current) use of anticoagulants Facility Procedures CPT4 Code: [id number] Description: [id number] - [place] W/O ANES NON SELECT Modifier: Quantity: 1 Physician Procedures CPT4 Code Description: [id number] - WC PHYS LEVEL 4 - EST PT ICD-10 Diagnosis Description E11.622 Type 2 diabetes mellitus with other skin ulcer L97.813 Non-pressure chronic ulcer of other part of right lower leg wit L97.413 Non-pressure chronic ulcer  of right heel and midfoot with necro N18.6 End  stage renal disease Modifier: h necrosis of mu sis of muscle Quantity: 1 scle Electronic Signature(s) Signed: 10/22/2018 12:02:24 PM By: Johnny Keeler PA-C Entered By: Johnny [name] on 10/18/2018 23:21:22

## 2018-10-22 NOTE — ED Notes (Signed)
Georgie RN attempted for 22g IV at [facility] fa.

## 2018-10-22 NOTE — ED Notes (Signed)
Amber, RN attempted IV access, unsuccessful. Charge RN to look while waiting for IV team.

## 2018-10-22 NOTE — ED Triage Notes (Signed)
Pt presents to ED via [facility] from home c/o infection to R lower leg. Pt states wound has been present now for 2 yrs and PCP told him to come to ED today. No hx DM. Pt is M/W/F dialysis patient, working fistula to R arm, old fistula to L arm.

## 2018-10-22 NOTE — Progress Notes (Signed)
Consult to start PIV. Patient is ESRD. RUE graft/fistula. LLE assessed with ultrasound. No viable vein assessed to access. Primary nurse notified to consult ED MD for further course.

## 2018-10-23 ENCOUNTER — Other Ambulatory Visit (INDEPENDENT_AMBULATORY_CARE_PROVIDER_SITE_OTHER): Payer: Self-pay | Admitting: Vascular Surgery

## 2018-10-23 ENCOUNTER — Encounter: Payer: Self-pay | Admitting: Anesthesiology

## 2018-10-23 LAB — BASIC METABOLIC PANEL
Anion gap: 13 (ref 5–15)
BUN: 46 mg/dL — ABNORMAL HIGH (ref 8–23)
CO2: 25 mmol/L (ref 22–32)
Calcium: 7.9 mg/dL — ABNORMAL LOW (ref 8.9–10.3)
Chloride: 100 mmol/L (ref 98–111)
Creatinine, Ser: 7.72 mg/dL — ABNORMAL HIGH (ref 0.61–1.24)
GFR calc Af Amer: 7 mL/min — ABNORMAL LOW (ref >60)
GFR calc non Af Amer: 6 mL/min — ABNORMAL LOW (ref >60)
Glucose, Bld: 76 mg/dL (ref 70–99)
Potassium: 5.2 mmol/L — ABNORMAL HIGH (ref 3.5–5.1)
Sodium: 138 mmol/L (ref 135–145)

## 2018-10-23 LAB — PROTIME-INR
INR: 1.3 — ABNORMAL HIGH (ref 0.8–1.2)
Prothrombin Time: 16.2 seconds — ABNORMAL HIGH (ref 11.4–15.2)

## 2018-10-23 LAB — CBC
HCT: 27.5 % — ABNORMAL LOW (ref 39.0–52.0)
Hemoglobin: 8.1 g/dL — ABNORMAL LOW (ref 13.0–17.0)
MCH: 26.8 pg (ref 26.0–34.0)
MCHC: 29.5 g/dL — ABNORMAL LOW (ref 30.0–36.0)
MCV: 91.1 fL (ref 80.0–100.0)
Platelets: 288 10*3/uL (ref 150–400)
RBC: 3.02 MIL/uL — ABNORMAL LOW (ref 4.22–5.81)
RDW: 15.4 % (ref 11.5–15.5)
WBC: 13.8 10*3/uL — ABNORMAL HIGH (ref 4.0–10.5)
nRBC: 0 % (ref 0.0–0.2)

## 2018-10-23 MED ORDER — RENA-VITE PO TABS
1.0000 | ORAL_TABLET | Freq: Every day | ORAL | Status: DC
  Administered 2018-10-23 – 2018-10-29 (×7): 1 via ORAL
  Filled 2018-10-23 (×7): qty 1

## 2018-10-23 MED ORDER — NEPRO/CARBSTEADY PO LIQD
237.0000 mL | Freq: Three times a day (TID) | ORAL | Status: DC
  Administered 2018-10-23 – 2018-10-29 (×13): 237 mL via ORAL

## 2018-10-23 MED ORDER — OCUVITE-LUTEIN PO CAPS
1.0000 | ORAL_CAPSULE | Freq: Every day | ORAL | Status: DC
  Administered 2018-10-24 – 2018-10-29 (×5): 1 via ORAL
  Filled 2018-10-23 (×7): qty 1

## 2018-10-23 MED ORDER — CEFAZOLIN SODIUM-DEXTROSE 1-4 GM/50ML-% IV SOLN
1.0000 g | Freq: Once | INTRAVENOUS | Status: AC
  Administered 2018-10-23: 1 g via INTRAVENOUS
  Filled 2018-10-23: qty 50

## 2018-10-23 MED ORDER — VITAMIN C 500 MG PO TABS
250.0000 mg | ORAL_TABLET | Freq: Two times a day (BID) | ORAL | Status: DC
  Administered 2018-10-23 – 2018-10-30 (×14): 250 mg via ORAL
  Filled 2018-10-23 (×14): qty 1

## 2018-10-23 MED ORDER — MORPHINE SULFATE (PF) 2 MG/ML IV SOLN
2.0000 mg | INTRAVENOUS | Status: DC | PRN
  Administered 2018-10-23 – 2018-10-28 (×10): 2 mg via INTRAVENOUS
  Filled 2018-10-23 (×10): qty 1

## 2018-10-23 NOTE — Progress Notes (Signed)
[facility]  ROUNDING NOTE   Subjective:   [name] admitted to [facility] on 10/22/2018 for Swelling [R60.9] Edema of upper extremity [R60.0] Cellulitis of right lower leg [B14.782]  Last hemodialysis was yesterday.   Objective:  Vital signs in last 24 hours:  Temp:  [97.7 F (36.5 C)-99.2 F (37.3 C)] 98.5 F (36.9 C) (06/16 1211) Pulse Rate:  [93-110] 93 (06/16 1211) Resp:  [15-25] 15 (06/16 1211) BP: (122-199)/(68-93) 187/82 (06/16 1211) SpO2:  [96 %-100 %] 97 % (06/16 1211) Weight:  [68.9 kg-70.5 kg] 70.5 kg (06/15 2052)  Weight change:  Filed Weights   10/22/18 1419 10/22/18 2052  Weight: 68.9 kg 70.5 kg    Intake/Output: No intake/output data recorded.   Intake/Output this shift:  Total I/O In: 120 [P.O.:120] Out: 0   Physical Exam: General: NAD,   Head: Normocephalic, atraumatic. Moist oral mucosal membranes  Eyes: Anicteric, PERRL  Neck: Supple, trachea midline  Lungs:  Clear to auscultation  Heart: Regular rate and rhythm  Abdomen:  Soft, nontender,   Extremities:  + peripheral edema. Right lower extremity in dressings, clean and dry  Neurologic: Nonfocal, moving all four extremities  Skin: No lesions  Access: Right arm AVG    Basic Metabolic Panel: Recent Labs  Lab 10/22/18 1948 10/23/18 0814  NA 139 138  K 4.9 5.2*  CL 99 100  CO2 24 25  GLUCOSE 85 76  BUN 40* 46*  CREATININE 6.62* 7.72*  CALCIUM 7.9* 7.9*    Liver Function Tests: Recent Labs  Lab 10/22/18 1948  AST 14*  ALT 7  ALKPHOS 73  BILITOT 0.5  PROT 6.8  ALBUMIN 2.7*   No results for input(s): LIPASE, AMYLASE in the last 168 hours. No results for input(s): AMMONIA in the last 168 hours.  CBC: Recent Labs  Lab 10/22/18 1948 10/23/18 0814  WBC 17.2* 13.8*  NEUTROABS 13.8*  --   HGB 8.9* 8.1*  HCT 29.8* 27.5*  MCV 90.0 91.1  PLT 249 288    Cardiac Enzymes: No results for input(s): CKTOTAL, CKMB, CKMBINDEX, TROPONINI in the last 168  hours.  BNP: Invalid input(s): POCBNP  CBG: No results for input(s): GLUCAP in the last 168 hours.  Microbiology: Results for orders placed or performed during the hospital encounter of 10/22/18  MRSA PCR Screening     Status: None   Collection Time: 10/22/18  2:16 PM   Specimen: Nasal Mucosa; Nasopharyngeal  Result Value Ref Range Status   MRSA by PCR NEGATIVE NEGATIVE Final    Comment:        The GeneXpert MRSA Assay (FDA approved for NASAL specimens only), is one component of a comprehensive MRSA colonization surveillance program. It is not intended to diagnose MRSA infection nor to guide or monitor treatment for MRSA infections. Performed at [facility], [address] [place], [place] [address]     Coagulation Studies: Recent Labs    10/23/18 0814  LABPROT 16.2*  INR 1.3*    Urinalysis: No results for input(s): COLORURINE, LABSPEC, PHURINE, GLUCOSEU, HGBUR, BILIRUBINUR, KETONESUR, PROTEINUR, UROBILINOGEN, NITRITE, LEUKOCYTESUR in the last 72 hours.  Invalid input(s): APPERANCEUR    Imaging: US Venous Img Lower Unilateral Right  Result Date: 10/22/2018 CLINICAL DATA:  68 year old male with a history EXAM: RIGHT LOWER EXTREMITY VENOUS DOPPLER ULTRASOUND TECHNIQUE: Gray-scale sonography with graded compression, as well as color Doppler and duplex ultrasound were performed to evaluate the lower extremity deep venous systems from the level of the  common femoral vein and including the common femoral, femoral, profunda femoral, popliteal and calf veins including the posterior tibial, peroneal and gastrocnemius veins when visible. The superficial great saphenous vein was also interrogated. Spectral Doppler was utilized to evaluate flow at rest and with distal augmentation maneuvers in the common femoral, femoral and popliteal veins. COMPARISON:  None. FINDINGS: Contralateral Common Femoral Vein: Respiratory phasicity is normal and symmetric with the symptomatic  side. No evidence of thrombus. Normal compressibility. Common Femoral Vein: No evidence of thrombus. Normal compressibility, respiratory phasicity and response to augmentation. Saphenofemoral Junction: No evidence of thrombus. Normal compressibility and flow on color Doppler imaging. Profunda Femoral Vein: No evidence of thrombus. Normal compressibility and flow on color Doppler imaging. Femoral Vein: No evidence of thrombus. Normal compressibility, respiratory phasicity and response to augmentation. Popliteal Vein: No evidence of thrombus. Normal compressibility, respiratory phasicity and response to augmentation. Calf Veins: Posterior tibial vein and peroneal vein not well visualized. Superficial Great Saphenous Vein: No evidence of thrombus. Normal compressibility and flow on color Doppler imaging. Other Findings: Edema. Lymph nodes of the inguinal region with typical architecture maintained, borderline enlarged. Vascular atherosclerosis incidentally imaged. IMPRESSION: Sonographic survey of the right lower extremity negative for DVT. Edema. Lymph nodes of the inguinal region, most likely reactive. Electronically Signed   By: Corrie Mckusick D.O.   On: 10/22/2018 16:30   US Venous Img Upper Uni Right  Result Date: 10/22/2018 CLINICAL DATA:  Right upper extremity pain and edema for the past 4-5 days. History upper extremity dialysis AV fistula. EXAM: RIGHT UPPER EXTREMITY VENOUS DOPPLER ULTRASOUND TECHNIQUE: Gray-scale sonography with graded compression, as well as color Doppler and duplex ultrasound were performed to evaluate the upper extremity deep venous system from the level of the subclavian vein and including the jugular, axillary, basilic, radial, ulnar and upper cephalic vein. Spectral Doppler was utilized to evaluate flow at rest and with distal augmentation maneuvers. COMPARISON:  None. FINDINGS: Contralateral Subclavian Vein: Respiratory phasicity is normal and symmetric with the symptomatic side. No  evidence of thrombus. Normal compressibility. Internal Jugular Vein: No evidence of thrombus. Normal compressibility, respiratory phasicity and response to augmentation. Subclavian Vein: No evidence of thrombus. Normal compressibility, respiratory phasicity and response to augmentation. Axillary Vein: No evidence of thrombus. Normal compressibility, respiratory phasicity and response to augmentation. Cephalic Vein: No evidence of thrombus. Normal compressibility, respiratory phasicity and response to augmentation. Basilic Vein: No evidence of thrombus. Normal compressibility, respiratory phasicity and response to augmentation. Brachial Veins: No evidence of thrombus. Normal compressibility, respiratory phasicity and response to augmentation. Radial Veins: No evidence of thrombus. Normal compressibility, respiratory phasicity and response to augmentation. Ulnar Veins: No evidence of thrombus. Normal compressibility, respiratory phasicity and response to augmentation. Venous Reflux:  None visualized. Other Findings:  None visualized. IMPRESSION: No evidence of DVT within the right upper extremity. Electronically Signed   By: Sandi Mariscal M.D.   On: 10/22/2018 16:29   [name] Ekg Site Rite  Result Date: 10/22/2018 If Site Rite image not attached, placement could not be confirmed due to current cardiac rhythm.    Medications:    . allopurinol  100 mg Oral Daily  . atorvastatin  10 mg Oral Daily  . clopidogrel  75 mg Oral Daily  . gabapentin  100 mg Oral BID  . heparin injection (subcutaneous)  5,000 Units Subcutaneous Q8H  . losartan  100 mg Oral Daily  . metoprolol tartrate  25 mg Oral BID  . sevelamer carbonate  2,400 mg Oral TID  WC   acetaminophen **OR** acetaminophen, albuterol, clonazePAM, hydrALAZINE, HYDROcodone-acetaminophen, morphine injection, ondansetron **OR** ondansetron (ZOFRAN) IV, polyethylene glycol  Assessment/ Plan:  [name] is a 68 y.o. black male with end stage renal  disease on hemodialysis, hypertension, peripheral vascular disease, congestive heart failure  [place] MWF 66kg Right AVG   1. End Stage Renal Disease: on hemodialysis. Last hemodialysis was yesterday. Plan on next treatment for Wednesday.   2. Hypertension: elevated 187/82. Continue metoprolol and losartan.   3. Anemia of chronic kidney disease: hemoglobin 8.1 - EPO with HD treatment  4. Secondary Hyperparathyroidism: labs from 6/8 PTH elevated at 750. Phosphorus 3.8, calcium 9.4 - Continue sevelamer with meals.   5. Peripheral vascular disease with cellulitis - Appreciate vascular input.    LOS: 1 Alois Colgan 6/16/20201:40 PM

## 2018-10-23 NOTE — ED Notes (Signed)
Received call from IV team that no PICC nurse would be available tonight. Reported to Gentry Fitz., RN, oncoming night nurse to pass to attending MD.

## 2018-10-23 NOTE — Progress Notes (Signed)
Established hemodialysis patient known at [facility] MWF 6:00.

## 2018-10-23 NOTE — Anesthesia Preprocedure Evaluation (Deleted)
Anesthesia Evaluation    Airway        Dental   Pulmonary former smoker,           Cardiovascular hypertension,      Neuro/Psych    GI/Hepatic   Endo/Other    Renal/GU      Musculoskeletal   Abdominal   Peds  Hematology   Anesthesia Other Findings Past Medical History: No date: Anemia No date: Anxiety No date: CHF (congestive heart failure) (HCC) No date: Chronic kidney disease     Comment:  esrd dialysis m/w/f No date: Gout No date: Hyperlipidemia No date: Hypertension 2010: Myocardial infarction [facility])     Comment:  10 years ago 2020: Neuromuscular disorder ([place])     Comment:  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 No date: Peripheral vascular disease [facility])   Reproductive/Obstetrics                             Anesthesia Physical Anesthesia Plan Anesthesia Quick Evaluation

## 2018-10-23 NOTE — Progress Notes (Signed)
Spoke with Josh RN, Patient is a dialysis/renal patient. Not a PICC candidate.

## 2018-10-23 NOTE — Progress Notes (Signed)
Initial Nutrition Assessment  DOCUMENTATION CODES:   Not applicable  INTERVENTION:   Nepro Shake po TID, each supplement provides 425 kcal and 19 grams protein  Rena-vite daily   Vitamin C 250mg  po BID  Ocuvite daily for wound healing (provides zinc, vitamin A, vitamin C, Vitamin E, copper, and selenium)  NUTRITION DIAGNOSIS:   Increased nutrient needs related to wound healing, chronic illness(ESRD on HD) as evidenced by increased estimated needs.  GOAL:   Patient will meet greater than or equal to 90% of their needs  MONITOR:   PO intake, Supplement acceptance, Labs, Weight trends, Skin, I & O's  REASON FOR ASSESSMENT:   Consult Wound healing  ASSESSMENT:   68 year old male with a past medical history of hypertension, hyperlipidemia, gastroparesis, diabetes, congestive heart failure, anemia, myocardial infarction, neuromuscular disorder, end-stage renal disease on chronic hemodialysis, peripheral vascular disease with slow healing right lower extremity wound status post debridement and skin graft who presents with lower R extremity wound cellulitis  RD working remotely.  Unable to reach pt by phone. Pt is well known to this RD from multiple previous admits. Pt with fair appetite and oral intake at baseline. Pt generally eats well while in hospital; pt documented to be eating 75% of meals today. 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 Pt also diagnosed with possible gastroparesis last year. RD will add supplements and vitamins to support wound healing and replace losses from HD. Would consider liberalizing diet once electrolytes wnl as renal diet is restrictive. Per chart, pt with weight gain pta.   Pt previously diagnosed with severe malnutrition during previous admit. Pt still remains at high risk for malnutrition but unable to diagnose at this time as NFPE cannot be performed. .   Medications reviewed and include: allopurinol,  plavix, heparin, renvela   Labs reviewed: K 5.2(H), BUN 46(H), creat 7.72(H) Wbc- 13.8(H), Hgb 8.1(L), Hct 27.5(L) iPTH- 553(H)- 2017  Unable to complete Nutrition-Focused physical exam at this time.   Diet Order:   Diet Order            Diet NPO time specified  Diet effective midnight        Diet renal/carb modified with fluid restriction Diet-HS Snack? Nothing; Fluid restriction: 1200 mL Fluid; Room service appropriate? Yes; Fluid consistency: Thin  Diet effective now             EDUCATION NEEDS:   Education needs have been addressed  Skin:  Skin Assessment: Reviewed RN Assessment(chronic venous stasis ulcer R leg)  Last BM:  pta  Height:   Ht Readings from Last 1 Encounters:  10/22/18 5\' 8"  (1.727 m)    Weight:   Wt Readings from Last 1 Encounters:  10/22/18 70.5 kg    Ideal Body Weight:  70 kg  BMI:  Body mass index is 23.63 kg/m.  Estimated Nutritional Needs:   Kcal:  1900-2200kcal/day  Protein:  105-120g/day  Fluid:  UOP + 1L  Koleen Distance MS, RD, LDN Pager #- [phone] Office#- [phone] After Hours Pager: [phone]

## 2018-10-23 NOTE — Consult Note (Signed)
[place] nurse consulted for LE wound. Longstanding history of venous ulceration, followed by both vascular surgeon and wound care center. Vascular PA has seen this patient and they will debride the wound and place NPWT dressing.   Will not consult for this reason.   If NPWT placed and WOC nurse needed for any reason, please re-consult.   Discussed POC with patient and bedside nurse.  Re consult if needed, will not follow at this time. Thanks  Demetre Monaco [employer], RN,CWOCN, CNS, [place] [phone])

## 2018-10-23 NOTE — Progress Notes (Signed)
[facility] Daily Progress Note   Subjective: Patient without complaint. Continue right upper extremity swelling and discomfort. Minimal right lower extremity pain. No issues overnight.   Objective: Vitals:   10/22/18 1930 10/22/18 1945 10/22/18 2052 10/23/18 0433  BP: (!) 165/78  (!) 176/75 122/68  Pulse:  (!) 103 96 (!) 105  Resp: 15 15 18 18   Temp:   97.7 F (36.5 C) 99.2 F (37.3 C)  TempSrc:   Oral Oral  SpO2:  96% 100% 98%  Weight:   70.5 kg   Height:   5\' 8"  (1.727 m)     Intake/Output Summary (Last 24 hours) at 10/23/2018 1132 Last data filed at 10/23/2018 1000 Gross per 24 hour  Intake 120 ml  Output 0 ml  Net 120 ml   Physical Exam: A&Ox3, NAD CV: RRR Pulmonary: CTA Bilaterally Abdomen: Soft, Nontender, Nondistended Vascular:  Right upper extremity: With moderate edema. Bruit and thrill noted. No acute vascular compromise to extremity  Right Lower Extremity: Thigh soft, calf soft. Dressing intact - clean and dry. Unable to palpate pedal pulses. Extremity is warm distally - becomes cooler at ankle. Motor / sensory is intact.    Laboratory: CBC    Component Value Date/Time   WBC 13.8 (H) 10/23/2018 0814   HGB 8.1 (L) 10/23/2018 0814   HGB 8.8 (L) 01/17/2014 0502   HCT 27.5 (L) 10/23/2018 0814   HCT 28.1 (L) 02/28/2018 1432   PLT 288 10/23/2018 0814   PLT 212 01/17/2014 0502   BMET    Component Value Date/Time   NA 138 10/23/2018 0814   NA 139 01/17/2014 0502   K 5.2 (H) 10/23/2018 0814   K 4.8 01/17/2014 0502   CL 100 10/23/2018 0814   CL 102 01/17/2014 0502   CO2 25 10/23/2018 0814   CO2 27 01/17/2014 0502   GLUCOSE 76 10/23/2018 0814   GLUCOSE 101 (H) 01/17/2014 0502   BUN 46 (H) 10/23/2018 0814   BUN 62 (H) 01/17/2014 0502   CREATININE 7.72 (H) 10/23/2018 0814   CREATININE 12.30 (H) 01/17/2014 0502   CALCIUM 7.9 (L) 10/23/2018 0814   CALCIUM 8.2 (L) 01/17/2014 0502   GFRNONAA 6 (L) 10/23/2018 0814   GFRNONAA 4 (L)  01/17/2014 0502   GFRAA 7 (L) 10/23/2018 0814   GFRAA 4 (L) 01/17/2014 0502   Assessment/Planning: The patient is a 68 year old male well-known to our practice with a past medical history of hypertension, hyperlipidemia, diabetes, congestive heart failure, anemia, myocardial infarction, neuromuscular disorder, end-stage renal disease on chronic hemodialysis, peripheral vascular disease with slow healing right lower extremity wound status post debridement status post skin graft who presents to the [facility] emergency department with a chief complaint of a right lower extremity wound / right upper extremity swelling 1) Vascular tech unable to locate arterial flow past femoral artery. Physical exam without any indication of acute arterial occlusion however will have patient undergo a right lower extremity angiogram with possible intervention while in OR for wound debridement tomorrow. Will pre-op. 2) Patient will also need to undergo right upper extremity shuntogram during this inpatient stay. Difficult situation as patient needs general anesthesia to undergo endovascular interventions.  3) Nephrology consulted for dialysis 4) Will consult nutrition to maximize diet / supplements for wound healing  Discussed with Dr. [facility] PA-C 10/23/2018 11:32 AM

## 2018-10-23 NOTE — Progress Notes (Signed)
[facility] [place] NAME: Johnny [name]    MR#:  [id number]  DATE OF BIRTH:  [birth year]  SUBJECTIVE:  CHIEF COMPLAINT:   Chief Complaint  Patient presents with  . Wound Infection   No new complaint this morning.  No fevers.  Notified that patient is scheduled for angiogram and debridement tomorrow.  REVIEW OF SYSTEMS:  Review of Systems  Constitutional: Negative for chills and fever.  HENT: Negative for hearing loss and tinnitus.   Eyes: Negative for blurred vision and double vision.  Respiratory: Negative for cough and shortness of breath.   Cardiovascular: Negative for chest pain and palpitations.  Gastrointestinal: Negative for heartburn, nausea and vomiting.  Genitourinary: Negative for dysuria and urgency.  Musculoskeletal: Negative for myalgias and neck pain.       Some pain in bilateral lower extremity  Skin: Negative for itching and rash.  Neurological: Negative for dizziness and headaches.  Psychiatric/Behavioral: Negative for depression and hallucinations.    DRUG ALLERGIES:   Allergies  Allergen Reactions  . Shellfish Allergy Anaphylaxis   VITALS:  Blood pressure (!) 187/82, pulse 93, temperature 98.5 F (36.9 C), temperature source Oral, resp. rate 15, height 5\' 8"  (1.727 m), weight 70.5 kg, SpO2 97 %. PHYSICAL EXAMINATION:  Physical Exam  GENERAL:  68 y.o.-year-old patient lying in the bed with no acute distress.  EYES: Pupils equal, round, reactive to light and accommodation. No scleral icterus. Extraocular muscles intact.  HEENT: Head atraumatic, normocephalic. Oropharynx and nasopharynx clear. No oropharyngeal erythema, moist oral mucosa  NECK:  Supple, no jugular venous distention. No thyroid enlargement, no tenderness.  LUNGS: Normal breath sounds bilaterally, no wheezing, rales, rhonchi. No use of accessory muscles of respiration.  CARDIOVASCULAR: S1, S2 normal. No murmurs, rubs, or gallops.  ABDOMEN:  Soft, nontender, nondistended. Bowel sounds present. No organomegaly or mass.  EXTREMITIES: No pedal edema, cyanosis, or clubbing. + 2 pedal & radial pulses b/l.   NEUROLOGIC: Cranial nerves II through XII are intact. No focal Motor or sensory deficits appreciated b/l PSYCHIATRIC: The patient is alert and oriented x 3. Good affect.  SKIN: Large right lower extremity wound on his right heel and extending up on ventral leg.  Looks dry exposing the fat tissue.  No discharge or foul smell.   LABORATORY PANEL:  Male CBC Recent Labs  Lab 10/23/18 0814  WBC 13.8*  HGB 8.1*  HCT 27.5*  PLT 288   ------------------------------------------------------------------------------------------------------------------ Chemistries  Recent Labs  Lab 10/22/18 1948 10/23/18 0814  NA 139 138  K 4.9 5.2*  CL 99 100  CO2 24 25  GLUCOSE 85 76  BUN 40* 46*  CREATININE 6.62* 7.72*  CALCIUM 7.9* 7.9*  AST 14*  --   ALT 7  --   ALKPHOS 73  --   BILITOT 0.5  --    RADIOLOGY:  US Venous Img Lower Unilateral Right  Result Date: 10/22/2018 CLINICAL DATA:  68 year old male with a history EXAM: RIGHT LOWER EXTREMITY VENOUS DOPPLER ULTRASOUND TECHNIQUE: Gray-scale sonography with graded compression, as well as color Doppler and duplex ultrasound were performed to evaluate the lower extremity deep venous systems from the level of the common femoral vein and including the common femoral, femoral, profunda femoral, popliteal and calf veins including the posterior tibial, peroneal and gastrocnemius veins when visible. The superficial great saphenous vein was also interrogated. Spectral Doppler was utilized to evaluate flow at rest and with distal augmentation maneuvers in the common femoral,  femoral and popliteal veins. COMPARISON:  None. FINDINGS: Contralateral Common Femoral Vein: Respiratory phasicity is normal and symmetric with the symptomatic side. No evidence of thrombus. Normal compressibility. Common Femoral  Vein: No evidence of thrombus. Normal compressibility, respiratory phasicity and response to augmentation. Saphenofemoral Junction: No evidence of thrombus. Normal compressibility and flow on color Doppler imaging. Profunda Femoral Vein: No evidence of thrombus. Normal compressibility and flow on color Doppler imaging. Femoral Vein: No evidence of thrombus. Normal compressibility, respiratory phasicity and response to augmentation. Popliteal Vein: No evidence of thrombus. Normal compressibility, respiratory phasicity and response to augmentation. Calf Veins: Posterior tibial vein and peroneal vein not well visualized. Superficial Great Saphenous Vein: No evidence of thrombus. Normal compressibility and flow on color Doppler imaging. Other Findings: Edema. Lymph nodes of the inguinal region with typical architecture maintained, borderline enlarged. Vascular atherosclerosis incidentally imaged. IMPRESSION: Sonographic survey of the right lower extremity negative for DVT. Edema. Lymph nodes of the inguinal region, most likely reactive. Electronically Signed   By: Corrie Mckusick D.O.   On: 10/22/2018 16:30   US Venous Img Upper Uni Right  Result Date: 10/22/2018 CLINICAL DATA:  Right upper extremity pain and edema for the past 4-5 days. History upper extremity dialysis AV fistula. EXAM: RIGHT UPPER EXTREMITY VENOUS DOPPLER ULTRASOUND TECHNIQUE: Gray-scale sonography with graded compression, as well as color Doppler and duplex ultrasound were performed to evaluate the upper extremity deep venous system from the level of the subclavian vein and including the jugular, axillary, basilic, radial, ulnar and upper cephalic vein. Spectral Doppler was utilized to evaluate flow at rest and with distal augmentation maneuvers. COMPARISON:  None. FINDINGS: Contralateral Subclavian Vein: Respiratory phasicity is normal and symmetric with the symptomatic side. No evidence of thrombus. Normal compressibility. Internal Jugular Vein:  No evidence of thrombus. Normal compressibility, respiratory phasicity and response to augmentation. Subclavian Vein: No evidence of thrombus. Normal compressibility, respiratory phasicity and response to augmentation. Axillary Vein: No evidence of thrombus. Normal compressibility, respiratory phasicity and response to augmentation. Cephalic Vein: No evidence of thrombus. Normal compressibility, respiratory phasicity and response to augmentation. Basilic Vein: No evidence of thrombus. Normal compressibility, respiratory phasicity and response to augmentation. Brachial Veins: No evidence of thrombus. Normal compressibility, respiratory phasicity and response to augmentation. Radial Veins: No evidence of thrombus. Normal compressibility, respiratory phasicity and response to augmentation. Ulnar Veins: No evidence of thrombus. Normal compressibility, respiratory phasicity and response to augmentation. Venous Reflux:  None visualized. Other Findings:  None visualized. IMPRESSION: No evidence of DVT within the right upper extremity. Electronically Signed   By: Sandi Mariscal M.D.   On: 10/22/2018 16:29   [name] Ekg Site Rite  Result Date: 10/22/2018 If Site Rite image not attached, placement could not be confirmed due to current cardiac rhythm.  ASSESSMENT AND PLAN:   1. Right lower extremity chronic wound with peripheral arterial disease.  Angiogram and debridement planned by vascular surgery on Wednesday.  No need for any antibiotics at this time.  Labs pending.  2.Right upper extremity swelling with fistula present.  Shuntogram being scheduled by vascular surgery  3.Uncontrolled hypertension.  Will start home medications.  IV hydralazine PRN.  4.End-stage renal disease on hemodialysis.   Nephrology service following.  Continue inpatient hemodialysis  5.Chronic diastolic CHF.  Stable.  DVT prophylaxis with heparin   All the records are reviewed and case discussed with Care Management/Social  Worker. Management plans discussed with the patient, family and they are in agreement.  CODE STATUS: Full Code  TOTAL  TIME TAKING CARE OF THIS PATIENT: 38 minutes.   More than 50% of the time was spent in counseling/coordination of care: YES  POSSIBLE D/C IN 3 DAYS, DEPENDING ON CLINICAL CONDITION.   Dalaysia Harms M.D on 10/23/2018 at 4:13 PM  Between 7am to 6pm - Pager - [phone]  After 6pm go to [email or web address] - Proofreader  [facility]  Office  [phone]  CC: Primary care physician; [name], MD  Note: This dictation was prepared with Dragon dictation along with smaller phrase technology. Any transcriptional errors that result from this process are unintentional.

## 2018-10-23 NOTE — Plan of Care (Signed)
  Problem: Clinical Measurements: Goal: Ability to maintain clinical measurements within normal limits will improve Outcome: Progressing Goal: Will remain free from infection Outcome: Progressing Goal: Diagnostic test results will improve Outcome: Progressing Goal: Respiratory complications will improve Outcome: Progressing Goal: Cardiovascular complication will be avoided Outcome: Progressing   Problem: Pain Managment: Goal: General experience of comfort will improve Outcome: Progressing  PRN pain medication has effective.

## 2018-10-24 ENCOUNTER — Encounter
Admission: EM | Disposition: A | Discharge status: Skilled Nursing Facility | Payer: Self-pay | Source: Home / Self Care | Attending: Internal Medicine

## 2018-10-24 ENCOUNTER — Inpatient Hospital Stay: Payer: Medicare Other | Admitting: Anesthesiology

## 2018-10-24 DIAGNOSIS — I871 Compression of vein: Secondary | ICD-10-CM

## 2018-10-24 DIAGNOSIS — N186 End stage renal disease: Secondary | ICD-10-CM

## 2018-10-24 DIAGNOSIS — M79641 Pain in right hand: Secondary | ICD-10-CM

## 2018-10-24 DIAGNOSIS — M79601 Pain in right arm: Secondary | ICD-10-CM

## 2018-10-24 DIAGNOSIS — T82898A Other specified complication of vascular prosthetic devices, implants and grafts, initial encounter: Secondary | ICD-10-CM

## 2018-10-24 DIAGNOSIS — Z992 Dependence on renal dialysis: Secondary | ICD-10-CM

## 2018-10-24 HISTORY — PX: A/V SHUNTOGRAM: CATH118297

## 2018-10-24 HISTORY — PX: UPPER EXTREMITY ANGIOGRAPHY: CATH118270

## 2018-10-24 LAB — NOVEL CORONAVIRUS, NAA (HOSP ORDER, SEND-OUT TO REF LAB; TAT 18-24 HRS): SARS-CoV-2, NAA: NOT DETECTED

## 2018-10-24 SURGERY — A/V SHUNTOGRAM
Anesthesia: Moderate Sedation | Laterality: Right

## 2018-10-24 SURGERY — A/V SHUNTOGRAM
Anesthesia: General | Laterality: Right

## 2018-10-24 SURGERY — UPPER EXTREMITY ANGIOGRAPHY
Anesthesia: General | Laterality: Right

## 2018-10-24 SURGERY — DEBRIDEMENT, WOUND
Anesthesia: General | Laterality: Right

## 2018-10-24 MED ORDER — PROPOFOL 500 MG/50ML IV EMUL
INTRAVENOUS | Status: AC
  Filled 2018-10-24: qty 50

## 2018-10-24 MED ORDER — DEXMEDETOMIDINE HCL 200 MCG/2ML IV SOLN
INTRAVENOUS | Status: DC | PRN
  Administered 2018-10-24: 8 ug via INTRAVENOUS

## 2018-10-24 MED ORDER — FAMOTIDINE 20 MG PO TABS
40.0000 mg | ORAL_TABLET | Freq: Once | ORAL | Status: AC
  Administered 2018-10-24: 40 mg via ORAL

## 2018-10-24 MED ORDER — MIDAZOLAM HCL 5 MG/5ML IJ SOLN
INTRAMUSCULAR | Status: DC | PRN
  Administered 2018-10-24: 1 mg via INTRAVENOUS

## 2018-10-24 MED ORDER — FENTANYL CITRATE (PF) 100 MCG/2ML IJ SOLN: 25.0000 ug | INTRAMUSCULAR | Status: DC | PRN

## 2018-10-24 MED ORDER — PHENYLEPHRINE HCL (PRESSORS) 10 MG/ML IV SOLN
INTRAVENOUS | Status: DC | PRN
  Administered 2018-10-24: 100 ug via INTRAVENOUS

## 2018-10-24 MED ORDER — METHYLPREDNISOLONE SODIUM SUCC 125 MG IJ SOLR
125.0000 mg | Freq: Once | INTRAMUSCULAR | Status: AC
  Administered 2018-10-24: 125 mg via INTRAVENOUS

## 2018-10-24 MED ORDER — CEFAZOLIN SODIUM-DEXTROSE 2-3 GM-%(50ML) IV SOLR
INTRAVENOUS | Status: DC | PRN
  Administered 2018-10-24: 1 g via INTRAVENOUS

## 2018-10-24 MED ORDER — FENTANYL CITRATE (PF) 100 MCG/2ML IJ SOLN
INTRAMUSCULAR | Status: AC
  Filled 2018-10-24: qty 2

## 2018-10-24 MED ORDER — SODIUM CHLORIDE 0.9 % IV SOLN: 250.0000 mg | Freq: Once | INTRAVENOUS | Status: DC

## 2018-10-24 MED ORDER — CEFAZOLIN SODIUM-DEXTROSE 1-4 GM/50ML-% IV SOLN
1.0000 g | Freq: Once | INTRAVENOUS | Status: DC
  Filled 2018-10-24: qty 50

## 2018-10-24 MED ORDER — METHYLPREDNISOLONE SODIUM SUCC 125 MG IJ SOLR
INTRAMUSCULAR | Status: AC
  Filled 2018-10-24: qty 2

## 2018-10-24 MED ORDER — ONDANSETRON HCL 4 MG/2ML IJ SOLN: 4.0000 mg | Freq: Once | INTRAMUSCULAR | Status: DC | PRN

## 2018-10-24 MED ORDER — FAMOTIDINE 20 MG PO TABS
ORAL_TABLET | ORAL | Status: AC
  Filled 2018-10-24: qty 2

## 2018-10-24 MED ORDER — PROPOFOL 500 MG/50ML IV EMUL
INTRAVENOUS | Status: DC | PRN
  Administered 2018-10-24: 25 ug/kg/min via INTRAVENOUS

## 2018-10-24 MED ORDER — IODIXANOL 320 MG/ML IV SOLN
INTRAVENOUS | Status: DC | PRN
  Administered 2018-10-24: 50 mL via INTRAVENOUS

## 2018-10-24 MED ORDER — FENTANYL CITRATE (PF) 100 MCG/2ML IJ SOLN
INTRAMUSCULAR | Status: DC | PRN
  Administered 2018-10-24: 50 ug via INTRAVENOUS
  Administered 2018-10-24 (×2): 25 ug via INTRAVENOUS

## 2018-10-24 MED ORDER — LIDOCAINE-EPINEPHRINE (PF) 1 %-1:200000 IJ SOLN
INTRAMUSCULAR | Status: AC
  Filled 2018-10-24: qty 30

## 2018-10-24 MED ORDER — MIDAZOLAM HCL 2 MG/2ML IJ SOLN
INTRAMUSCULAR | Status: AC
  Filled 2018-10-24: qty 2

## 2018-10-24 MED ORDER — HEPARIN SODIUM (PORCINE) 1000 UNIT/ML IJ SOLN
INTRAMUSCULAR | Status: AC
  Filled 2018-10-24: qty 1

## 2018-10-24 MED ORDER — SODIUM CHLORIDE 0.9 % IV SOLN
INTRAVENOUS | Status: DC | PRN
  Administered 2018-10-24: 15:00:00 via INTRAVENOUS

## 2018-10-24 MED ORDER — HEPARIN SODIUM (PORCINE) 1000 UNIT/ML IJ SOLN
INTRAMUSCULAR | Status: DC | PRN
  Administered 2018-10-24: 3000 [IU] via INTRAVENOUS

## 2018-10-24 SURGICAL SUPPLY — 18 items
BALLN ATG 12X6X80 (BALLOONS) ×4
BALLN ATG 14X4X80 (BALLOONS) ×4
BALLN ULTRVRSE 10X60X75 (BALLOONS) ×4
BALLOON ATG 12X6X80 (BALLOONS) ×3 IMPLANT
BALLOON ATG 14X4X80 (BALLOONS) ×3 IMPLANT
BALLOON ULTRVRSE 10X60X75 (BALLOONS) ×3 IMPLANT
CANNULA 5F STIFF (CANNULA) ×4 IMPLANT
CATH BEACON 5 .035 40 KMP TP (CATHETERS) ×3 IMPLANT
CATH BEACON 5 .038 40 KMP TP (CATHETERS) ×1
DEVICE PRESTO INFLATION (MISCELLANEOUS) ×4 IMPLANT
DRAPE BRACHIAL (DRAPES) ×4 IMPLANT
PACK ANGIOGRAPHY (CUSTOM PROCEDURE TRAY) ×4 IMPLANT
SHEATH BRITE TIP 5FRX11 (SHEATH) ×4 IMPLANT
SHEATH BRITE TIP 6FRX5.5 (SHEATH) ×4 IMPLANT
SHEATH BRITE TIP 7FRX5.5 (SHEATH) ×4 IMPLANT
STENT VENOVO 14X60X80 (Permanent Stent) ×4 IMPLANT
SUT MNCRL AB 4-0 PS2 18 (SUTURE) ×4 IMPLANT
WIRE MAGIC TOR.035 180C (WIRE) ×4 IMPLANT

## 2018-10-24 NOTE — Anesthesia Preprocedure Evaluation (Signed)
Anesthesia Evaluation  Patient identified by MRN, date of birth, ID band Patient awake    Reviewed: Allergy & Precautions, NPO status , Patient's Chart, lab work & pertinent test results, reviewed documented beta blocker date and time   Airway Mallampati: II  TM Distance: >3 FB     Dental  (+) Chipped   Pulmonary former smoker,           Cardiovascular hypertension, Pt. on medications and Pt. on home beta blockers + Past MI, + Peripheral Vascular Disease and +CHF       Neuro/Psych PSYCHIATRIC DISORDERS Anxiety  Neuromuscular disease    GI/Hepatic   Endo/Other  diabetes, Type 2  Renal/GU ESRFRenal disease     Musculoskeletal   Abdominal   Peds  Hematology  (+) anemia ,   Anesthesia Other Findings Gout. Some confusion earlier. EKG shows PACs and poor R waves. Echo 1 yr ago 72-75.  Reproductive/Obstetrics                             Anesthesia Physical Anesthesia Plan  ASA: III  Anesthesia Plan: General   Post-op Pain Management:    Induction: Intravenous  PONV Risk Score and Plan:   Airway Management Planned:   Additional Equipment:   Intra-op Plan:   Post-operative Plan:   Informed Consent: I have reviewed the patients History and Physical, chart, labs and discussed the procedure including the risks, benefits and alternatives for the proposed anesthesia with the patient or authorized representative who has indicated his/her understanding and acceptance.       Plan Discussed with: CRNA  Anesthesia Plan Comments:         Anesthesia Quick Evaluation

## 2018-10-24 NOTE — Progress Notes (Signed)
Pt refused lab draw. MD notified and states can be drawn in HD. Awaiting call from HD.

## 2018-10-24 NOTE — Progress Notes (Signed)
Post HD [facility]    10/24/18 2045  Hand-Off documentation  Report given to (Full Name) Elmer Picker, RN   Report received from (Full Name) Beatris Ship, RN   Vital Signs  Temp 98.1 F (36.7 C)  Temp Source Oral  Pulse Rate 99  Pulse Rate Source Monitor  Resp 18  BP 127/78  BP Location Left Wrist  BP Method Automatic  Patient Position (if appropriate) Lying  Oxygen Therapy  SpO2 95 %  O2 Device Room Air  Pulse Oximetry Type Continuous  Pain Assessment  Pain Scale 0-10  Pain Score 0  Dialysis Weight  Weight 69.8 kg  Type of Weight Post-Dialysis  Post-Hemodialysis Assessment  Rinseback Volume (mL) 250 mL  KECN 60.9 V  Dialyzer Clearance Clear  Duration of HD Treatment -hour(s) 3 hour(s)  Hemodialysis Intake (mL) 750 mL  UF Total -Machine (mL) 1263 mL  Net UF (mL) 513 mL  Tolerated HD Treatment Yes  AVG/AVF Arterial Site Held (minutes) 5 minutes  AVG/AVF Venous Site Held (minutes) 5 minutes  Fistula / Graft Right Upper arm Arteriovenous vein graft  Placement Date/Time: 07/19/18 1324   Placed prior to admission: No  Orientation: Right  Access Location: (c) Upper arm  Access Type: Arteriovenous vein graft  Site Condition No complications  Fistula / Graft Assessment Present;Thrill;Bruit  Status Deaccessed  Drainage Description None

## 2018-10-24 NOTE — Anesthesia Postprocedure Evaluation (Signed)
Anesthesia Post Note  Patient: Johnny [name].  Procedure(s) Performed: Upper Extremity Angiography A/V SHUNTOGRAM (N/A )  Patient location during evaluation: PACU Anesthesia Type: General Level of consciousness: awake and alert Pain management: pain level controlled Vital Signs Assessment: post-procedure vital signs reviewed and stable Respiratory status: spontaneous breathing, nonlabored ventilation and respiratory function stable Cardiovascular status: blood pressure returned to baseline and stable Postop Assessment: no apparent nausea or vomiting Anesthetic complications: no     Last Vitals:  Vitals:   10/24/18 1945 10/24/18 2000  BP: (!) 167/88 (!) 186/97  Pulse: (!) 105 (!) 110  Resp: 11 14  Temp:    SpO2: 100% 100%    Last Pain:  Vitals:   10/24/18 1712  TempSrc: Oral  PainSc:                  Durenda Hurt

## 2018-10-24 NOTE — Progress Notes (Signed)
Pre HD Assessment    10/24/18 1658  Neurological  Level of Consciousness Responds to Voice  Orientation Level Oriented X4  Respiratory  Respiratory Pattern Regular  Chest Assessment Chest expansion symmetrical  Bilateral Breath Sounds Diminished;Clear  Cough None  Cardiac  Pulse Regular  Heart Sounds S1, S2  Jugular Venous Distention (JVD) Yes  ECG Monitor Yes  Cardiac Rhythm NSR  Vascular  R Radial Pulse +2  L Radial Pulse +2  Edema Generalized  Generalized Edema +1  Psychosocial  Psychosocial (WDL) WDL

## 2018-10-24 NOTE — Progress Notes (Signed)
Notified Dr Lucky Cowboy and PA via epic chat of pt's concerns for continuing with surgery as well as report by night shift RN that pt's POA [name] was not sure she wanted to proceed. Requested that [name] be called and discussed to confirm continued plan of care.

## 2018-10-24 NOTE — Progress Notes (Signed)
Pt desating and B/P dropped, pt confused, MD notified, applied O2 at 4L Sat increased from 88 to 95, BFR reduced to 300 and UF turned off, B/P recovering at next interval.

## 2018-10-24 NOTE — Progress Notes (Signed)
[facility]  ROUNDING NOTE   Subjective:   Angiogram for later today. Hemodialysis for after the procedure.   Patient is upset about his diagnosis and plan for amputation  Objective:  Vital signs in last 24 hours:  Temp:  [98.3 F (36.8 C)-98.9 F (37.2 C)] 98.8 F (37.1 C) (06/17 1222) Pulse Rate:  [92-101] 97 (06/17 1222) Resp:  [16-20] 20 (06/17 0358) BP: (104-156)/(64-88) 156/88 (06/17 1222) SpO2:  [96 %-100 %] 96 % (06/17 1222)  Weight change:  Filed Weights   10/22/18 1419 10/22/18 2052  Weight: 68.9 kg 70.5 kg    Intake/Output: I/O last 3 completed shifts: In: 440 [P.O.:440] Out: 0    Intake/Output this shift:  No intake/output data recorded.  Physical Exam: General: NAD,   Head: Normocephalic, atraumatic. Moist oral mucosal membranes  Eyes: Anicteric, PERRL  Neck: Supple, trachea midline  Lungs:  Clear to auscultation  Heart: Regular rate and rhythm  Abdomen:  Soft, nontender,   Extremities:  + peripheral edema. Right lower extremity in dressings, clean and dry  Neurologic: Nonfocal, moving all four extremities  Skin: No lesions  Access: Right arm AVG    Basic Metabolic Panel: Recent Labs  Lab 10/22/18 1948 10/23/18 0814  NA 139 138  K 4.9 5.2*  CL 99 100  CO2 24 25  GLUCOSE 85 76  BUN 40* 46*  CREATININE 6.62* 7.72*  CALCIUM 7.9* 7.9*    Liver Function Tests: Recent Labs  Lab 10/22/18 1948  AST 14*  ALT 7  ALKPHOS 73  BILITOT 0.5  PROT 6.8  ALBUMIN 2.7*   No results for input(s): LIPASE, AMYLASE in the last 168 hours. No results for input(s): AMMONIA in the last 168 hours.  CBC: Recent Labs  Lab 10/22/18 1948 10/23/18 0814  WBC 17.2* 13.8*  NEUTROABS 13.8*  --   HGB 8.9* 8.1*  HCT 29.8* 27.5*  MCV 90.0 91.1  PLT 249 288    Cardiac Enzymes: No results for input(s): CKTOTAL, CKMB, CKMBINDEX, TROPONINI in the last 168 hours.  BNP: Invalid input(s): POCBNP  CBG: No results for input(s): GLUCAP in the  last 168 hours.  Microbiology: Results for orders placed or performed during the hospital encounter of 10/22/18  MRSA PCR Screening     Status: None   Collection Time: 10/22/18  2:16 PM   Specimen: Nasal Mucosa; Nasopharyngeal  Result Value Ref Range Status   MRSA by PCR NEGATIVE NEGATIVE Final    Comment:        The GeneXpert MRSA Assay (FDA approved for NASAL specimens only), is one component of a comprehensive MRSA colonization surveillance program. It is not intended to diagnose MRSA infection nor to guide or monitor treatment for MRSA infections. Performed at [facility], [address] [place], [place] [address]   Novel Coronavirus,NAA,(SEND-OUT TO REF LAB - TAT 24-48 hrs); Hosp Order     Status: None   Collection Time: 10/22/18  3:52 PM   Specimen: Nasopharyngeal Swab; Respiratory  Result Value Ref Range Status   SARS-CoV-2, NAA NOT DETECTED NOT DETECTED Final    Comment: (NOTE) This test was developed and its performance characteristics determined by [employer]. This test has not been FDA cleared or approved. This test has been authorized by FDA under an Emergency Use Authorization (EUA). This test is only authorized for the duration of time the declaration that circumstances exist justifying the authorization of the emergency use of in vitro diagnostic tests for detection of SARS-CoV-2 virus  and/or diagnosis of COVID-19 infection under section 564(b)(1) of the Act, 21 U.S.C. 628ZMO-2(H)(4), unless the authorization is terminated or revoked sooner. When diagnostic testing is negative, the possibility of a false negative result should be considered in the context of a patient's recent exposures and the presence of clinical signs and symptoms consistent with COVID-19. An individual without symptoms of COVID-19 and who is not shedding SARS-CoV-2 virus would expect to have a negative (not detected) result in this assay. Performed  At: [facility] [address] [place], [place] [id number] Rush Farmer MD WS:[id number]    [place]  Final    Comment: Performed at [facility], [address] [place], [place] [address]  Blood Culture (routine x 2)     Status: None (Preliminary result)   Collection Time: 10/23/18  8:15 AM   Specimen: BLOOD  Result Value Ref Range Status   Specimen Description BLOOD BLOOD LEFT HAND  Final   Special Requests   Final    BOTTLES DRAWN AEROBIC AND ANAEROBIC Blood Culture adequate volume   Culture   Final    NO GROWTH < 24 HOURS Performed at [facility], [address], [place], [place] [address]    Report Status PENDING  Incomplete  Blood Culture (routine x 2)     Status: None (Preliminary result)   Collection Time: 10/23/18  8:32 AM   Specimen: BLOOD  Result Value Ref Range Status   Specimen Description BLOOD BLOOD RIGHT WRIST  Final   Special Requests   Final    BOTTLES DRAWN AEROBIC AND ANAEROBIC Blood Culture adequate volume   Culture   Final    NO GROWTH < 24 HOURS Performed at [facility], [address] [place], [place] [address]    Report Status PENDING  Incomplete    Coagulation Studies: Recent Labs    10/23/18 0814  LABPROT 16.2*  INR 1.3*    Urinalysis: No results for input(s): COLORURINE, LABSPEC, PHURINE, GLUCOSEU, HGBUR, BILIRUBINUR, KETONESUR, PROTEINUR, UROBILINOGEN, NITRITE, LEUKOCYTESUR in the last 72 hours.  Invalid input(s): APPERANCEUR    Imaging: US Venous Img Lower Unilateral Right  Result Date: 10/22/2018 CLINICAL DATA:  68 year old male with a history EXAM: RIGHT LOWER EXTREMITY VENOUS DOPPLER ULTRASOUND TECHNIQUE: Gray-scale sonography with graded compression, as well as color Doppler and duplex ultrasound were performed to evaluate the lower extremity deep venous systems from the level of the common femoral vein and including the common femoral, femoral, profunda femoral, popliteal and  calf veins including the posterior tibial, peroneal and gastrocnemius veins when visible. The superficial great saphenous vein was also interrogated. Spectral Doppler was utilized to evaluate flow at rest and with distal augmentation maneuvers in the common femoral, femoral and popliteal veins. COMPARISON:  None. FINDINGS: Contralateral Common Femoral Vein: Respiratory phasicity is normal and symmetric with the symptomatic side. No evidence of thrombus. Normal compressibility. Common Femoral Vein: No evidence of thrombus. Normal compressibility, respiratory phasicity and response to augmentation. Saphenofemoral Junction: No evidence of thrombus. Normal compressibility and flow on color Doppler imaging. Profunda Femoral Vein: No evidence of thrombus. Normal compressibility and flow on color Doppler imaging. Femoral Vein: No evidence of thrombus. Normal compressibility, respiratory phasicity and response to augmentation. Popliteal Vein: No evidence of thrombus. Normal compressibility, respiratory phasicity and response to augmentation. Calf Veins: Posterior tibial vein and peroneal vein not well visualized. Superficial Great Saphenous Vein: No evidence of thrombus. Normal compressibility and flow on color Doppler imaging. Other Findings: Edema. Lymph nodes  of the inguinal region with typical architecture maintained, borderline enlarged. Vascular atherosclerosis incidentally imaged. IMPRESSION: Sonographic survey of the right lower extremity negative for DVT. Edema. Lymph nodes of the inguinal region, most likely reactive. Electronically Signed   By: Corrie Mckusick D.O.   On: 10/22/2018 16:30   US Venous Img Upper Uni Right  Result Date: 10/22/2018 CLINICAL DATA:  Right upper extremity pain and edema for the past 4-5 days. History upper extremity dialysis AV fistula. EXAM: RIGHT UPPER EXTREMITY VENOUS DOPPLER ULTRASOUND TECHNIQUE: Gray-scale sonography with graded compression, as well as color Doppler and duplex  ultrasound were performed to evaluate the upper extremity deep venous system from the level of the subclavian vein and including the jugular, axillary, basilic, radial, ulnar and upper cephalic vein. Spectral Doppler was utilized to evaluate flow at rest and with distal augmentation maneuvers. COMPARISON:  None. FINDINGS: Contralateral Subclavian Vein: Respiratory phasicity is normal and symmetric with the symptomatic side. No evidence of thrombus. Normal compressibility. Internal Jugular Vein: No evidence of thrombus. Normal compressibility, respiratory phasicity and response to augmentation. Subclavian Vein: No evidence of thrombus. Normal compressibility, respiratory phasicity and response to augmentation. Axillary Vein: No evidence of thrombus. Normal compressibility, respiratory phasicity and response to augmentation. Cephalic Vein: No evidence of thrombus. Normal compressibility, respiratory phasicity and response to augmentation. Basilic Vein: No evidence of thrombus. Normal compressibility, respiratory phasicity and response to augmentation. Brachial Veins: No evidence of thrombus. Normal compressibility, respiratory phasicity and response to augmentation. Radial Veins: No evidence of thrombus. Normal compressibility, respiratory phasicity and response to augmentation. Ulnar Veins: No evidence of thrombus. Normal compressibility, respiratory phasicity and response to augmentation. Venous Reflux:  None visualized. Other Findings:  None visualized. IMPRESSION: No evidence of DVT within the right upper extremity. Electronically Signed   By: Sandi Mariscal M.D.   On: 10/22/2018 16:29   [name] Ekg Site Rite  Result Date: 10/22/2018 If Site Rite image not attached, placement could not be confirmed due to current cardiac rhythm.    Medications:    . allopurinol  100 mg Oral Daily  . atorvastatin  10 mg Oral Daily  . clopidogrel  75 mg Oral Daily  . feeding supplement (NEPRO CARB STEADY)  237 mL Oral TID BM   . gabapentin  100 mg Oral BID  . heparin injection (subcutaneous)  5,000 Units Subcutaneous Q8H  . losartan  100 mg Oral Daily  . metoprolol tartrate  25 mg Oral BID  . multivitamin  1 tablet Oral QHS  . multivitamin-lutein  1 capsule Oral Daily  . sevelamer carbonate  2,400 mg Oral TID WC  . vitamin C  250 mg Oral BID   acetaminophen **OR** acetaminophen, albuterol, clonazePAM, hydrALAZINE, HYDROcodone-acetaminophen, morphine injection, ondansetron **OR** ondansetron (ZOFRAN) IV, polyethylene glycol  Assessment/ Plan:  [name] is a 68 y.o. black male with end stage renal disease on hemodialysis, hypertension, peripheral vascular disease, congestive heart failure  [address] MWF 66kg Right AVG   1. End Stage Renal Disease: on hemodialysis.  Hemodialysis scheduled for later today. Orders prepared.  Complication with dialysis access. Concerning for central stenosis causing edema and steal syndrome causing pain and numbness.  Appreciate vascular input. Angiogram for later today.   2. Hypertension: elevated 156/88. Continue metoprolol and losartan.   3. Anemia of chronic kidney disease: hemoglobin 8.1 - EPO with HD treatment  4. Secondary Hyperparathyroidism: labs from 6/8 PTH elevated at 750. Phosphorus 3.8, calcium 9.4 - Continue sevelamer with meals.  5. Peripheral vascular disease with cellulitis - Appreciate vascular input. Scheduled for amputation - plan is for above the knee amputation of right lower extremity by Dr. Lucky Cowboy - empiric cefepime and vanco   LOS: 2 Jericha Bryden 6/17/202012:45 PM

## 2018-10-24 NOTE — Transfer of Care (Signed)
Immediate Anesthesia Transfer of Care Note  Patient: Johnny [name].  Procedure(s) Performed: Upper Extremity Angiography A/V SHUNTOGRAM (N/A )  Patient Location: PACU  Anesthesia Type:General  Level of Consciousness: sedated  Airway & Oxygen Therapy: Patient Spontanous Breathing and Patient connected to face mask oxygen  Post-op Assessment: Report given to RN and Post -op Vital signs reviewed and stable  Post vital signs: Reviewed and stable  Last Vitals:  Vitals Value Taken Time  BP 115/76 10/24/18 1554  Temp 36.7 C 10/24/18 1554  Pulse 101 10/24/18 1555  Resp 33 10/24/18 1555  SpO2 100 % 10/24/18 1555  Vitals shown include unvalidated device data.  Last Pain:  Vitals:   10/24/18 1554  TempSrc:   PainSc: Asleep      Patients Stated Pain Goal: 0 (73/71/06 2694)  Complications: No apparent anesthesia complications

## 2018-10-24 NOTE — Progress Notes (Signed)
HD Tx completed tolerated well    10/24/18 2043  Vital Signs  Pulse Rate 98  Pulse Rate Source Monitor  Resp 18  BP (!) 142/95  BP Location Left Wrist  BP Method Automatic  Patient Position (if appropriate) Lying  Oxygen Therapy  SpO2 95 %  O2 Device Room Air  Pulse Oximetry Type Continuous  During Hemodialysis Assessment  HD Safety Checks Performed Yes  KECN 60.9 KECN  Dialysis Fluid Bolus Normal Saline  Bolus Amount (mL) 250 mL  Intra-Hemodialysis Comments Tx completed;Tolerated well

## 2018-10-24 NOTE — Op Note (Signed)
[facility]    OPERATIVE NOTE   PROCEDURE: 1.  Right brachial artery to axillary vein arteriovenous graft cannulation under ultrasound guidance 2.  Right arm shuntogram 3.  Percutaneous transluminal angioplasty of right innominate vein with 10 and 12 mm diameter angioplasty balloons 4.  Stent placement to the right innominate vein with a 14 mm diameter by 6 cm length Venovo stent for greater than 50% residual stenosis after angioplasty 5.  Catheter placement into right radial artery, right ulnar artery, and proximal right brachial artery from access through the graft 6.  Right upper extremity angiogram  PRE-OPERATIVE DIAGNOSIS: 1. ESRD 2. Malfunctioning right brachial artery to axillary vein arteriovenous graft severe swelling and right hand and arm pain, possible steal syndrome  POST-OPERATIVE DIAGNOSIS: same as above with reasonably good arterial flow to the hand and no severe arterial steal syndrome  SURGEON: Leotis Pain, MD  ANESTHESIA: local with MCS  ESTIMATED BLOOD LOSS: 10 cc  FINDING(S): 1. The AV graft was widely patent itself without any significant stenosis.  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  SPECIMEN(S):  None  CONTRAST: 50 cc  FLUORO TIME: 7.3 minutes  ANESTHESIA: MAC  INDICATIONS: Johnny [name]. is a 68 y.o. male who presents with malfunctioning right brachial artery to axillary vein arteriovenous graft.  His arm is very swollen and he had severe right hand pain.  It was difficult to tell if he had steal syndrome, or the symptoms were largely from the marked arm swelling.  The patient is scheduled for right arm shuntogram and we felt evaluating his right upper extremity arterial circuit while he was in there for steal syndrome would also be appropriate.  The patient is aware the risks include but are not limited to: bleeding, infection, thrombosis of the cannulated access, and possible  anaphylactic reaction to the contrast.  The patient is aware of the risks of the procedure and elects to proceed forward.  DESCRIPTION: After full informed written consent was obtained, the patient was brought back to the angiography suite and placed supine upon the angiography table.  The patient was connected to monitoring equipment. Moderate conscious sedation was administered during a face to face encounter throughout the procedure with my supervision of the RN administering medicines and monitoring the patient's vital signs, pulse oximetry, telemetry and mental status throughout from the start of the procedure until the patient was taken to the recovery room The right arm was prepped and draped in the standard fashion for a percutaneous access intervention.  Under ultrasound guidance, the arterial access site of the arteriovenous graft was cannulated with a micropuncture needle under direct ultrasound guidance in an antegrade fashion were it was patent and a permanent image was performed.  The microwire was advanced into the graft and the needle was exchanged for the a microsheath.  I then upsized to a 6 Fr Sheath and imaging was performed.  Hand injections were completed to image the access including the central venous system. This demonstrated The AV graft was widely patent itself without any significant stenosis.  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  Based on the images, this patient will need intervention to the central venous stenosis. I then gave the patient 3000 units of intravenous heparin.  I then crossed the stenosis with a Magic Tourqe wire.  Based on the imaging, a 10 mm x 6 cm  angioplasty balloon was  selected.  The balloon was centered around the stenosis and inflated to 10 ATM for 1 minute(s).  On completion imaging, a >50% residual stenosis was present.  We upsized to a 7 Fr sheath and then a 12 mm balloon.  This was inflated to 10 atm for one minute  but >50% stenosis remained.  We then selected a 14 mm diameter by 6 cm length Venovo stent and deployed this in the right innominate vein taking care to stay just above the left innominate vein so that a PermCath could be placed on the left in the future if needed.  This came back across the origin of the innominate into the proximal right subclavian vein.  It was postdilated with a 14 mm balloon with excellent angiographic completion result and less than 10% residual stenosis. I then elected to evaluate the arterial perfusion of the right upper extremity.  I was able to flip with a 7 French sheath in a retrograde fashion under fluoroscopic guidance.  I first advanced a Kumpe catheter and the Magic torque wire down into the distal brachial artery and selective imaging showed what appeared to be decent flow through both the radial and ulnar arteries although it was somewhat sluggish with filling of the hand.  I then advanced into the right radial artery with a Kumpe catheter and selective imaging was performed which showed no focal stenosis within the radial artery, a good palmar arch, and good flow into the hand.  The catheter was then pulled back to the brachial artery and readvanced down into the ulnar artery.  Selective right ulnar artery imaging was performed which showed no focal stenosis in the ulnar artery again with reasonably brisk flow into the hand and good cross-filling through the palmar arch although the ulnar artery was slightly smaller than the radial artery.  I then wanted to evaluate how much flow was going out the graft and how much was going distally with a more proximal injection.  A Kumpe catheter and a Magic torque wire was then used to pull back to the arterial anastomosis and then go up into the proximal arm and into the brachial artery in the proximal arm.  Right upper extremity angiogram then showed no stenosis within the proximal, mid, or distal brachial artery with good flow into the AV  graft.  There was reasonably good flow into the radial and ulnar artery distally so that if there was any steal syndrome it was quite mild.  Based on the completion imaging, no further intervention is necessary.  The wire and balloon were removed from the sheath.  A 4-0 Monocryl purse-string suture was sewn around the sheath.  The sheath was removed while tying down the suture.  A sterile bandage was applied to the puncture site.  COMPLICATIONS: None  CONDITION: Stable   Leotis Pain  10/24/2018 3:49 PM    This note was created with Dragon Medical transcription system. Any errors in dictation are purely unintentional.

## 2018-10-24 NOTE — Anesthesia Post-op Follow-up Note (Signed)
Anesthesia QCDR form completed.        

## 2018-10-24 NOTE — Progress Notes (Signed)
Pt B/P and O2 Sat made full recovery, pt resting comfortable, MD gave verbal order to keep dialysis changes through completion. UF will remain off.

## 2018-10-24 NOTE — Progress Notes (Signed)
HD Tx started w/o complication   03/04/24 3664  Hand-Off documentation  Report given to (Full Name) Beatris Ship, RN   Report received from (Full Name) Dara Lords, RN   Vital Signs  Temp 98.1 F (36.7 C)  Temp Source Oral  Pulse Rate 84  Pulse Rate Source Monitor  Resp 14  BP (!) 152/58  BP Location Left Wrist  BP Method Automatic  Patient Position (if appropriate) Lying  Oxygen Therapy  SpO2 95 %  O2 Device Room Air  Pulse Oximetry Type Continuous  Pain Assessment  Pain Scale 0-10  Pain Score 0  Dialysis Weight  Weight 70.5 kg  Time-Out for Hemodialysis  What Procedure? HD  Pt Identifiers(min of two) First/Last Name;MRN/Account#  Correct Site? Yes  Correct Side? Yes  Correct Procedure? Yes  Consents Verified? Yes  Rad Studies Available? N/A  Safety Precautions Reviewed? Yes  Engineer, civil (consulting) Number 2  Station Number 4  UF/Alarm Test Passed  Conductivity: Meter 14  Conductivity: Machine  14.1  pH 7.2  Reverse Osmosis Main  Normal Saline Lot Number [id number]  Dialyzer Lot Number [id number]  Disposable Set Lot Number [id number]  Machine Temperature 98.6 F (37 C)  Musician and Audible Yes  Blood Lines Intact and Secured Yes  Pre Treatment Patient Checks  Vascular access used during treatment Fistula  Hepatitis B Surface Antigen Results Pending  Isolation Initiated Yes  Hepatitis B Surface Antibody  (unk)  Date Hepatitis B Surface Antibody Drawn 10/24/18  Hemodialysis Consent Verified Yes  Hemodialysis Standing Orders Initiated Yes  ECG (Telemetry) Monitor On Yes  Prime Ordered Normal Saline  Length of  DialysisTreatment -hour(s) 3.5 Hour(s)  Dialysis Treatment Comments Na 140  Dialyzer Elisio 17H NR  Dialysate 3K;2.5 Ca  Dialysis Anticoagulant None  Dialysate Flow Ordered 600  Blood Flow Rate Ordered 400 mL/min  Ultrafiltration Goal 2.5 Liters  Pre Treatment Labs Hepatitis B Surface Antigen  Dialysis Blood Pressure Support Ordered Normal  Saline  During Hemodialysis Assessment  Blood Flow Rate (mL/min) 400 mL/min  Arterial Pressure (mmHg) -190 mmHg  Venous Pressure (mmHg) 230 mmHg  Transmembrane Pressure (mmHg) 50 mmHg  Ultrafiltration Rate (mL/min) 1000 mL/min  Dialysate Flow Rate (mL/min) 600 ml/min  Conductivity: Machine  14.2  HD Safety Checks Performed Yes  Dialysis Fluid Bolus Normal Saline  Bolus Amount (mL) 250 mL  Intra-Hemodialysis Comments Tx initiated  Education / Care Plan  Dialysis Education Provided No (Comment)  Documented Education in Care Plan  (Pt medicated, recieved post procedure from PACU )  Fistula / Graft Right Upper arm Arteriovenous vein graft  Placement Date/Time: 07/19/18 1324   Placed prior to admission: No  Orientation: Right  Access Location: (c) Upper arm  Access Type: Arteriovenous vein graft  Site Condition No complications  Fistula / Graft Assessment Present;Thrill;Bruit  Status Accessed  Needle Size 15g  Drainage Description None

## 2018-10-24 NOTE — Progress Notes (Signed)
[facility] [place] NAME: Jerik Falletta    MR#:  [id number]  DATE OF BIRTH:  [birth year]  SUBJECTIVE:  CHIEF COMPLAINT:   Chief Complaint  Patient presents with  . Wound Infection   No new complaint this morning.  No fevers.  REVIEW OF SYSTEMS:  Review of Systems  Constitutional: Negative for chills and fever.  HENT: Negative for hearing loss and tinnitus.   Eyes: Negative for blurred vision and double vision.  Respiratory: Negative for cough and shortness of breath.   Cardiovascular: Negative for chest pain and palpitations.  Gastrointestinal: Negative for heartburn, nausea and vomiting.  Genitourinary: Negative for dysuria and urgency.  Musculoskeletal: Negative for myalgias and neck pain.       Some pain in bilateral lower extremity  Skin: Negative for itching and rash.  Neurological: Negative for dizziness and headaches.  Psychiatric/Behavioral: Negative for depression and hallucinations.    DRUG ALLERGIES:   Allergies  Allergen Reactions  . Shellfish Allergy Anaphylaxis   VITALS:  Blood pressure 138/67, pulse (!) 101, temperature 98.9 F (37.2 C), temperature source Oral, resp. rate 20, height 5\' 8"  (1.727 m), weight 70.5 kg, SpO2 98 %. PHYSICAL EXAMINATION:  Physical Exam  GENERAL:  68 y.o.-year-old patient lying in the bed with no acute distress.  EYES: Pupils equal, round, reactive to light and accommodation. No scleral icterus. Extraocular muscles intact.  HEENT: Head atraumatic, normocephalic. Oropharynx and nasopharynx clear. No oropharyngeal erythema, moist oral mucosa  NECK:  Supple, no jugular venous distention. No thyroid enlargement, no tenderness.  LUNGS: Normal breath sounds bilaterally, no wheezing, rales, rhonchi. No use of accessory muscles of respiration.  CARDIOVASCULAR: S1, S2 normal. No murmurs, rubs, or gallops.  ABDOMEN: Soft, nontender, nondistended. Bowel sounds present. No organomegaly or mass.   EXTREMITIES: No pedal edema, cyanosis, or clubbing. + 2 pedal & radial pulses b/l.   NEUROLOGIC: Cranial nerves II through XII are intact. No focal Motor or sensory deficits appreciated b/l PSYCHIATRIC: The patient is alert and oriented x 3. Good affect.  SKIN: Large right lower extremity wound on his right heel and extending up on ventral leg.  Looks dry exposing the fat tissue.  No discharge or foul smell.   LABORATORY PANEL:  Male CBC Recent Labs  Lab 10/23/18 0814  WBC 13.8*  HGB 8.1*  HCT 27.5*  PLT 288   ------------------------------------------------------------------------------------------------------------------ Chemistries  Recent Labs  Lab 10/22/18 1948 10/23/18 0814  NA 139 138  K 4.9 5.2*  CL 99 100  CO2 24 25  GLUCOSE 85 76  BUN 40* 46*  CREATININE 6.62* 7.72*  CALCIUM 7.9* 7.9*  AST 14*  --   ALT 7  --   ALKPHOS 73  --   BILITOT 0.5  --    RADIOLOGY:  No results found. ASSESSMENT AND PLAN:   1. Right lower extremity chronic wound with peripheral arterial disease. Patient reevaluated by vascular surgery who also discussed with patient and family.  Plan is for right AKA tomorrow.  2.Right upper extremity swelling with fistula present.  Shuntogram scheduled for today by vascular surgery  3.Uncontrolled hypertension.    Blood pressure better controlled.  IV hydralazine PRN.  4.End-stage renal disease on hemodialysis.   Nephrology service following.  Continue inpatient hemodialysis  5.Chronic diastolic CHF.  Stable.  DVT prophylaxis with heparin   All the records are reviewed and case discussed with Care Management/Social Worker. Management plans discussed with the patient, family and they  are in agreement.  CODE STATUS: Full Code  TOTAL TIME TAKING CARE OF THIS PATIENT: 36 minutes.   More than 50% of the time was spent in counseling/coordination of care: YES  POSSIBLE D/C IN 2 DAYS, DEPENDING ON CLINICAL CONDITION.   Shakala Marlatt M.D  on 10/24/2018 at 12:19 PM  Between 7am to 6pm - Pager - [phone]  After 6pm go to [email or web address] - Proofreader  [facility]  Office  [phone]  CC: Primary care physician; [name], MD  Note: This dictation was prepared with Dragon dictation along with smaller phrase technology. Any transcriptional errors that result from this process are unintentional.

## 2018-10-24 NOTE — Progress Notes (Signed)
Post HD Assessment   10/24/18 2045  Neurological  Level of Consciousness Alert  Orientation Level Oriented X4  Respiratory  Respiratory Pattern Regular  Chest Assessment Chest expansion symmetrical  Bilateral Breath Sounds Clear;Diminished  Cough None  Cardiac  Pulse Regular  Heart Sounds S1, S2  Jugular Venous Distention (JVD) Yes  ECG Monitor Yes  Cardiac Rhythm NSR  Vascular  R Radial Pulse +2  L Radial Pulse +2  Edema Generalized  Generalized Edema +1  Psychosocial  Psychosocial (WDL) WDL

## 2018-10-24 NOTE — H&P (Signed)
[facility] History & Physical Update  The patient was interviewed and re-examined.  The patient's previous History and Physical has been reviewed and is unchanged.  Patient and his power of attorney have decided to proceed with an amputation on the right leg so no right leg angiogram will be done.  This will be an AKA and this will be done tomorrow.  Today, we are going to evaluate his right arm AV graft for both swelling and pain.  A shuntogram and possibly a right upper extremity angiogram will be performed.  We plan to proceed with the scheduled procedure.  Leotis Pain, MD  10/24/2018, 2:03 PM

## 2018-10-25 ENCOUNTER — Encounter: Payer: Self-pay | Admitting: Vascular Surgery

## 2018-10-25 ENCOUNTER — Ambulatory Visit: Payer: Medicare Other | Admitting: Physician Assistant

## 2018-10-25 ENCOUNTER — Inpatient Hospital Stay: Payer: Medicare Other | Admitting: Anesthesiology

## 2018-10-25 ENCOUNTER — Encounter
Admission: EM | Disposition: A | Discharge status: Skilled Nursing Facility | Payer: Self-pay | Source: Home / Self Care | Attending: Internal Medicine

## 2018-10-25 DIAGNOSIS — N186 End stage renal disease: Secondary | ICD-10-CM

## 2018-10-25 DIAGNOSIS — S81801A Unspecified open wound, right lower leg, initial encounter: Secondary | ICD-10-CM

## 2018-10-25 DIAGNOSIS — I739 Peripheral vascular disease, unspecified: Secondary | ICD-10-CM

## 2018-10-25 HISTORY — PX: AMPUTATION: SHX166

## 2018-10-25 LAB — BASIC METABOLIC PANEL
Anion gap: 15 (ref 5–15)
BUN: 42 mg/dL — ABNORMAL HIGH (ref 8–23)
CO2: 27 mmol/L (ref 22–32)
Calcium: 8.4 mg/dL — ABNORMAL LOW (ref 8.9–10.3)
Chloride: 97 mmol/L — ABNORMAL LOW (ref 98–111)
Creatinine, Ser: 6.46 mg/dL — ABNORMAL HIGH (ref 0.61–1.24)
GFR calc Af Amer: 9 mL/min — ABNORMAL LOW (ref >60)
GFR calc non Af Amer: 8 mL/min — ABNORMAL LOW (ref >60)
Glucose, Bld: 130 mg/dL — ABNORMAL HIGH (ref 70–99)
Potassium: 5.6 mmol/L — ABNORMAL HIGH (ref 3.5–5.1)
Sodium: 139 mmol/L (ref 135–145)

## 2018-10-25 LAB — CBC
HCT: 36.4 % — ABNORMAL LOW (ref 39.0–52.0)
Hemoglobin: 10.8 g/dL — ABNORMAL LOW (ref 13.0–17.0)
MCH: 26.6 pg (ref 26.0–34.0)
MCHC: 29.7 g/dL — ABNORMAL LOW (ref 30.0–36.0)
MCV: 89.7 fL (ref 80.0–100.0)
Platelets: 384 10*3/uL (ref 150–400)
RBC: 4.06 MIL/uL — ABNORMAL LOW (ref 4.22–5.81)
RDW: 15.5 % (ref 11.5–15.5)
WBC: 24.5 10*3/uL — ABNORMAL HIGH (ref 4.0–10.5)
nRBC: 0 % (ref 0.0–0.2)

## 2018-10-25 LAB — APTT: aPTT: 38 seconds — ABNORMAL HIGH (ref 24–36)

## 2018-10-25 LAB — TYPE AND SCREEN
ABO/RH(D): B NEG
Antibody Screen: NEGATIVE

## 2018-10-25 LAB — MAGNESIUM: Magnesium: 2 mg/dL (ref 1.7–2.4)

## 2018-10-25 LAB — PROTIME-INR
INR: 1.1 (ref 0.8–1.2)
Prothrombin Time: 13.9 seconds (ref 11.4–15.2)

## 2018-10-25 SURGERY — AMPUTATION, ABOVE KNEE
Anesthesia: General | Site: Leg Upper | Laterality: Right

## 2018-10-25 MED ORDER — PROPOFOL 10 MG/ML IV BOLUS
INTRAVENOUS | Status: DC | PRN
  Administered 2018-10-25: 100 mg via INTRAVENOUS

## 2018-10-25 MED ORDER — FENTANYL CITRATE (PF) 100 MCG/2ML IJ SOLN
INTRAMUSCULAR | Status: DC | PRN
  Administered 2018-10-25: 50 ug via INTRAVENOUS
  Administered 2018-10-25: 25 ug via INTRAVENOUS

## 2018-10-25 MED ORDER — MIDAZOLAM HCL 2 MG/2ML IJ SOLN
INTRAMUSCULAR | Status: AC
  Filled 2018-10-25: qty 2

## 2018-10-25 MED ORDER — CEFAZOLIN SODIUM-DEXTROSE 1-4 GM/50ML-% IV SOLN
INTRAVENOUS | Status: DC | PRN
  Administered 2018-10-25: 1 g via INTRAVENOUS

## 2018-10-25 MED ORDER — ACETAMINOPHEN 325 MG PO TABS: 325.0000 mg | ORAL_TABLET | ORAL | Status: DC | PRN

## 2018-10-25 MED ORDER — FENTANYL CITRATE (PF) 100 MCG/2ML IJ SOLN: 25.0000 ug | INTRAMUSCULAR | Status: DC | PRN

## 2018-10-25 MED ORDER — FENTANYL CITRATE (PF) 100 MCG/2ML IJ SOLN
INTRAMUSCULAR | Status: AC
  Filled 2018-10-25: qty 2

## 2018-10-25 MED ORDER — PHENYLEPHRINE HCL (PRESSORS) 10 MG/ML IV SOLN
INTRAVENOUS | Status: DC | PRN
  Administered 2018-10-25: 100 ug via INTRAVENOUS

## 2018-10-25 MED ORDER — MIDAZOLAM HCL 2 MG/2ML IJ SOLN
INTRAMUSCULAR | Status: DC | PRN
  Administered 2018-10-25: 2 mg via INTRAVENOUS

## 2018-10-25 MED ORDER — SODIUM CHLORIDE 0.9 % IV SOLN
INTRAVENOUS | Status: DC | PRN
  Administered 2018-10-25: 14:00:00 via INTRAVENOUS

## 2018-10-25 MED ORDER — PROMETHAZINE HCL 25 MG/ML IJ SOLN: 6.2500 mg | INTRAMUSCULAR | Status: DC | PRN

## 2018-10-25 MED ORDER — CEFAZOLIN SODIUM-DEXTROSE 1-4 GM/50ML-% IV SOLN
INTRAVENOUS | Status: AC
  Filled 2018-10-25: qty 50

## 2018-10-25 MED ORDER — LIDOCAINE HCL (CARDIAC) PF 100 MG/5ML IV SOSY
PREFILLED_SYRINGE | INTRAVENOUS | Status: DC | PRN
  Administered 2018-10-25: 100 mg via INTRAVENOUS

## 2018-10-25 MED ORDER — ACETAMINOPHEN 160 MG/5ML PO SOLN
325.0000 mg | ORAL | Status: DC | PRN
  Filled 2018-10-25: qty 10.2

## 2018-10-25 SURGICAL SUPPLY — 39 items
BANDAGE ELASTIC 6 LF NS (GAUZE/BANDAGES/DRESSINGS) ×2 IMPLANT
BLADE SAGITTAL WIDE XTHICK NO (BLADE) ×2 IMPLANT
BNDG COHESIVE 4X5 TAN STRL (GAUZE/BANDAGES/DRESSINGS) ×3 IMPLANT
BNDG GAUZE 4.5X4.1 6PLY STRL (MISCELLANEOUS) ×4 IMPLANT
BNDG STRETCH 4X75 STRL LF (GAUZE/BANDAGES/DRESSINGS) ×1 IMPLANT
BRUSH SCRUB EZ  4% CHG (MISCELLANEOUS) ×1
BRUSH SCRUB EZ 4% CHG (MISCELLANEOUS) ×1 IMPLANT
CANISTER SUCT 1200ML W/VALVE (MISCELLANEOUS) ×2 IMPLANT
CHLORAPREP W/TINT 26ML (MISCELLANEOUS) ×2 IMPLANT
COVER WAND RF STERILE (DRAPES) ×2 IMPLANT
DRAPE INCISE IOBAN 66X45 STRL (DRAPES) ×2 IMPLANT
DRAPE INCISE IOBAN 66X60 STRL (DRAPES) ×2 IMPLANT
ELECT CAUTERY BLADE 6.4 (BLADE) ×2 IMPLANT
ELECT REM PT RETURN 9FT ADLT (ELECTROSURGICAL) ×2
ELECTRODE REM PT RTRN 9FT ADLT (ELECTROSURGICAL) ×1 IMPLANT
GAUZE SPONGE 4X4 12PLY STRL (GAUZE/BANDAGES/DRESSINGS) ×1 IMPLANT
GAUZE XEROFORM 1X8 LF (GAUZE/BANDAGES/DRESSINGS) ×4 IMPLANT
GLOVE BIO SURGEON STRL SZ7 (GLOVE) ×4 IMPLANT
GLOVE INDICATOR 7.5 STRL GRN (GLOVE) ×2 IMPLANT
GOWN STRL REUS W/ TWL LRG LVL3 (GOWN DISPOSABLE) ×1 IMPLANT
GOWN STRL REUS W/ TWL XL LVL3 (GOWN DISPOSABLE) ×2 IMPLANT
GOWN STRL REUS W/TWL LRG LVL3 (GOWN DISPOSABLE) ×1
GOWN STRL REUS W/TWL XL LVL3 (GOWN DISPOSABLE) ×2
HANDLE YANKAUER SUCT BULB TIP (MISCELLANEOUS) ×2 IMPLANT
KIT TURNOVER KIT A (KITS) ×2 IMPLANT
LABEL OR SOLS (LABEL) ×2 IMPLANT
NS IRRIG 1000ML POUR BTL (IV SOLUTION) ×2 IMPLANT
PACK EXTREMITY ARMC (MISCELLANEOUS) ×2 IMPLANT
PAD ABD DERMACEA PRESS 5X9 (GAUZE/BANDAGES/DRESSINGS) ×3 IMPLANT
PAD PREP 24X41 OB/GYN DISP (PERSONAL CARE ITEMS) ×2 IMPLANT
SPONGE LAP 18X18 RF (DISPOSABLE) ×4 IMPLANT
STAPLER SKIN PROX 35W (STAPLE) ×2 IMPLANT
STOCKINETTE IMPERV 14X48 (MISCELLANEOUS) ×1 IMPLANT
STOCKINETTE M/LG 89821 (MISCELLANEOUS) ×2 IMPLANT
SUT SILK 2 0 (SUTURE) ×1
SUT SILK 2 0 SH (SUTURE) ×4 IMPLANT
SUT SILK 2-0 18XBRD TIE 12 (SUTURE) ×1 IMPLANT
SUT VIC AB 0 CT1 36 (SUTURE) ×4 IMPLANT
SUT VIC AB 2-0 CT1 (SUTURE) ×4 IMPLANT

## 2018-10-25 NOTE — H&P (Signed)
[facility] History & Physical Update  The patient was interviewed and re-examined.  The patient's previous History and Physical has been reviewed and is unchanged.  There is no change in the plan of care. We plan to proceed with the scheduled procedure.  Leotis Pain, MD  10/25/2018, 1:47 PM

## 2018-10-25 NOTE — Progress Notes (Signed)
[facility]  ROUNDING NOTE   Subjective:   Hemodialysis treatment yesterday. Tolerated treatment well. UF of 579mL  Amputation scheduled for today  Objective:  Vital signs in last 24 hours:  Temp:  [97.5 F (36.4 C)-98.4 F (36.9 C)] 98 F (36.7 C) (06/18 1334) Pulse Rate:  [80-117] 80 (06/18 1334) Resp:  [9-27] 16 (06/18 1334) BP: (88-206)/(44-97) 165/48 (06/18 1334) SpO2:  [87 %-100 %] 97 % (06/18 1334) Weight:  [69.3 kg-70.5 kg] 69.3 kg (06/18 0500)  Weight change:  Filed Weights   10/24/18 1710 10/24/18 2045 10/25/18 0500  Weight: 70.5 kg 69.8 kg 69.3 kg    Intake/Output: I/O last 3 completed shifts: In: 220 [P.O.:120; I.V.:100] Out: 513 [Other:513]   Intake/Output this shift:  No intake/output data recorded.  Physical Exam: General: NAD,   Head: Normocephalic, atraumatic. Moist oral mucosal membranes  Eyes: Anicteric, PERRL  Neck: Supple, trachea midline  Lungs:  Clear to auscultation  Heart: Regular rate and rhythm  Abdomen:  Soft, nontender,   Extremities:  + peripheral edema. Right lower extremity in dressings, clean and dry  Neurologic: Nonfocal, moving all four extremities  Skin: No lesions  Access: Right arm AVG    Basic Metabolic Panel: Recent Labs  Lab 10/22/18 1948 10/23/18 0814 10/25/18 0950  NA 139 138 139  K 4.9 5.2* 5.6*  CL 99 100 97*  CO2 24 25 27   GLUCOSE 85 76 130*  BUN 40* 46* 42*  CREATININE 6.62* 7.72* 6.46*  CALCIUM 7.9* 7.9* 8.4*  MG  --   --  2.0    Liver Function Tests: Recent Labs  Lab 10/22/18 1948  AST 14*  ALT 7  ALKPHOS 73  BILITOT 0.5  PROT 6.8  ALBUMIN 2.7*   No results for input(s): LIPASE, AMYLASE in the last 168 hours. No results for input(s): AMMONIA in the last 168 hours.  CBC: Recent Labs  Lab 10/22/18 1948 10/23/18 0814 10/25/18 0950  WBC 17.2* 13.8* 24.5*  NEUTROABS 13.8*  --   --   HGB 8.9* 8.1* 10.8*  HCT 29.8* 27.5* 36.4*  MCV 90.0 91.1 89.7  PLT 249 288 384     Cardiac Enzymes: No results for input(s): CKTOTAL, CKMB, CKMBINDEX, TROPONINI in the last 168 hours.  BNP: Invalid input(s): POCBNP  CBG: No results for input(s): GLUCAP in the last 168 hours.  Microbiology: Results for orders placed or performed during the hospital encounter of 10/22/18  MRSA PCR Screening     Status: None   Collection Time: 10/22/18  2:16 PM   Specimen: Nasal Mucosa; Nasopharyngeal  Result Value Ref Range Status   MRSA by PCR NEGATIVE NEGATIVE Final    Comment:        The GeneXpert MRSA Assay (FDA approved for NASAL specimens only), is one component of a comprehensive MRSA colonization surveillance program. It is not intended to diagnose MRSA infection nor to guide or monitor treatment for MRSA infections. Performed at [facility], [address] [place], [place] [address]   Novel Coronavirus,NAA,(SEND-OUT TO REF LAB - TAT 24-48 hrs); Hosp Order     Status: None   Collection Time: 10/22/18  3:52 PM   Specimen: Nasopharyngeal Swab; Respiratory  Result Value Ref Range Status   SARS-CoV-2, NAA NOT DETECTED NOT DETECTED Final    Comment: (NOTE) This test was developed and its performance characteristics determined by [employer]. This test has not been FDA cleared or approved. This test has been authorized by FDA under an  Emergency Use Authorization (EUA). This test is only authorized for the duration of time the declaration that circumstances exist justifying the authorization of the emergency use of in vitro diagnostic tests for detection of SARS-CoV-2 virus and/or diagnosis of COVID-19 infection under section 564(b)(1) of the Act, 21 U.S.C. 062IRS-8(N)(4), unless the authorization is terminated or revoked sooner. When diagnostic testing is negative, the possibility of a false negative result should be considered in the context of a patient's recent exposures and the presence of clinical signs and symptoms consistent with  COVID-19. An individual without symptoms of COVID-19 and who is not shedding SARS-CoV-2 virus would expect to have a negative (not detected) result in this assay. Performed  At: [facility] [address] [place], [place] [id number] Rush Farmer MD FG:[id number]    [place]  Final    Comment: Performed at [facility], [address] [place], [place] [address]  Blood Culture (routine x 2)     Status: None (Preliminary result)   Collection Time: 10/23/18  8:15 AM   Specimen: BLOOD  Result Value Ref Range Status   Specimen Description BLOOD BLOOD LEFT HAND  Final   Special Requests   Final    BOTTLES DRAWN AEROBIC AND ANAEROBIC Blood Culture adequate volume   Culture   Final    NO GROWTH 2 DAYS Performed at [facility], [address] [place], [place] [address]    Report Status PENDING  Incomplete  Blood Culture (routine x 2)     Status: None (Preliminary result)   Collection Time: 10/23/18  8:32 AM   Specimen: BLOOD  Result Value Ref Range Status   Specimen Description BLOOD BLOOD RIGHT WRIST  Final   Special Requests   Final    BOTTLES DRAWN AEROBIC AND ANAEROBIC Blood Culture adequate volume   Culture   Final    NO GROWTH 2 DAYS Performed at [facility], [address] [place], [place] [address]    Report Status PENDING  Incomplete    Coagulation Studies: Recent Labs    10/23/18 0814 10/25/18 0950  LABPROT 16.2* 13.9  INR 1.3* 1.1    Urinalysis: No results for input(s): COLORURINE, LABSPEC, PHURINE, GLUCOSEU, HGBUR, BILIRUBINUR, KETONESUR, PROTEINUR, UROBILINOGEN, NITRITE, LEUKOCYTESUR in the last 72 hours.  Invalid input(s): APPERANCEUR    Imaging: No results found.   Medications:   . ceFAZolin     . [MAR Hold] allopurinol  100 mg Oral Daily  . [MAR Hold] atorvastatin  10 mg Oral Daily  . [MAR Hold] clopidogrel  75 mg Oral Daily  . [MAR Hold] feeding supplement (NEPRO CARB STEADY)   237 mL Oral TID BM  . [MAR Hold] gabapentin  100 mg Oral BID  . [MAR Hold] heparin injection (subcutaneous)  5,000 Units Subcutaneous Q8H  . [MAR Hold] losartan  100 mg Oral Daily  . [MAR Hold] metoprolol tartrate  25 mg Oral BID  . [MAR Hold] multivitamin  1 tablet Oral QHS  . [MAR Hold] multivitamin-lutein  1 capsule Oral Daily  . [MAR Hold] sevelamer carbonate  2,400 mg Oral TID WC  . [MAR Hold] vitamin C  250 mg Oral BID   [MAR Hold] acetaminophen **OR** [MAR Hold] acetaminophen, [MAR Hold] albuterol, [MAR Hold] clonazePAM, [MAR Hold] hydrALAZINE, [MAR Hold] HYDROcodone-acetaminophen, [MAR Hold]  morphine injection, [MAR Hold] ondansetron **OR** [MAR Hold] ondansetron (ZOFRAN) IV, [MAR Hold] polyethylene glycol  Assessment/ Plan:  [name] is a 68 y.o. black male with end  stage renal disease on hemodialysis, hypertension, peripheral vascular disease, congestive heart failure  [facility] [address] MWF 66kg Right AVG   1. End Stage Renal Disease:  Complication with dialysis access. Concerning for central stenosis causing edema and steal syndrome causing pain and numbness. Status post angiogram 6/18 Dr. Lucky Cowboy with balloon angioplasty  Appreciate vascular input  - MWF schedule  2. Hypertension:  - Continue metoprolol and losartan.   3. Anemia of chronic kidney disease:   - EPO with HD treatment  4. Secondary Hyperparathyroidism: labs from 6/8 PTH elevated at 750. Phosphorus 3.8, calcium 9.4 - Continue sevelamer with meals.   5. Peripheral vascular disease with cellulitis - Appreciate vascular input. Scheduled for amputation - plan is for above the knee amputation of right lower extremity by Dr. Lucky Cowboy - empiric cefepime and vanco   LOS: 3 Annaleigha Woo 6/18/20202:01 PM

## 2018-10-25 NOTE — Care Management Important Message (Signed)
Important Message  Patient Details  Name: Johnny [name]. MRN: [id number] Date of Birth: [birth year]   Medicare Important Message Given:  Yes    Dannette Barbara 10/25/2018, 12:29 PM

## 2018-10-25 NOTE — Transfer of Care (Signed)
Immediate Anesthesia Transfer of Care Note  Patient: Johnny [name].  Procedure(s) Performed: AMPUTATION ABOVE KNEE (Right Leg Upper)  Patient Location: PACU  Anesthesia Type:General  Level of Consciousness: sedated  Airway & Oxygen Therapy: Patient Spontanous Breathing and Patient connected to face mask oxygen  Post-op Assessment: Report given to RN and Post -op Vital signs reviewed and stable  Post vital signs: Reviewed and stable  Last Vitals:  Vitals Value Taken Time  BP 158/59 10/25/18 1514  Temp    Pulse 91 10/25/18 1518  Resp 17 10/25/18 1518  SpO2 100 % 10/25/18 1518  Vitals shown include unvalidated device data.  Last Pain:  Vitals:   10/25/18 1334  TempSrc: Temporal  PainSc: 8       Patients Stated Pain Goal: 0 (66/81/59 4707)  Complications: No apparent anesthesia complications

## 2018-10-25 NOTE — Progress Notes (Signed)
[facility] [place] NAME: Trampas Stettner    MR#:  [id number]  DATE OF BIRTH:  10/28/1950  SUBJECTIVE:  CHIEF COMPLAINT:   Chief Complaint  Patient presents with  . Wound Infection   No new complaint this morning.  No fevers.  Patient scheduled for above-knee amputation today  REVIEW OF SYSTEMS:  Review of Systems  Constitutional: Negative for chills and fever.  HENT: Negative for hearing loss and tinnitus.   Eyes: Negative for blurred vision and double vision.  Respiratory: Negative for cough and shortness of breath.   Cardiovascular: Negative for chest pain and palpitations.  Gastrointestinal: Negative for heartburn, nausea and vomiting.  Genitourinary: Negative for dysuria and urgency.  Musculoskeletal: Negative for myalgias and neck pain.       Some pain in bilateral lower extremity  Skin: Negative for itching and rash.  Neurological: Negative for dizziness and headaches.  Psychiatric/Behavioral: Negative for depression and hallucinations.    DRUG ALLERGIES:   Allergies  Allergen Reactions  . Shellfish Allergy Anaphylaxis   VITALS:  Blood pressure (!) 142/90, pulse 97, temperature (!) 97.5 F (36.4 C), temperature source Oral, resp. rate 16, height 5\' 8"  (1.727 m), weight 69.3 kg, SpO2 99 %. PHYSICAL EXAMINATION:  Physical Exam  GENERAL:  68 y.o.-year-old patient lying in the bed with no acute distress.  EYES: Pupils equal, round, reactive to light and accommodation. No scleral icterus. Extraocular muscles intact.  HEENT: Head atraumatic, normocephalic. Oropharynx and nasopharynx clear. No oropharyngeal erythema, moist oral mucosa  NECK:  Supple, no jugular venous distention. No thyroid enlargement, no tenderness.  LUNGS: Normal breath sounds bilaterally, no wheezing, rales, rhonchi. No use of accessory muscles of respiration.  CARDIOVASCULAR: S1, S2 normal. No murmurs, rubs, or gallops.  ABDOMEN: Soft, nontender,  nondistended. Bowel sounds present. No organomegaly or mass.  EXTREMITIES: Dressing in place to right lower extremity.  No edema.[name]   NEUROLOGIC: Cranial nerves II through XII are intact. No focal Motor or sensory deficits appreciated b/l PSYCHIATRIC: The patient is alert and oriented x 3. Good affect.  SKIN: Large right lower extremity wound on his right heel and extending up on ventral leg.  Looks dry exposing the fat tissue.  No discharge or foul smell.   LABORATORY PANEL:  Male CBC Recent Labs  Lab 10/25/18 0950  WBC 24.5*  HGB 10.8*  HCT 36.4*  PLT 384   ------------------------------------------------------------------------------------------------------------------ Chemistries  Recent Labs  Lab 10/22/18 1948  10/25/18 0950  NA 139   < > 139  K 4.9   < > 5.6*  CL 99   < > 97*  CO2 24   < > 27  GLUCOSE 85   < > 130*  BUN 40*   < > 42*  CREATININE 6.62*   < > 6.46*  CALCIUM 7.9*   < > 8.4*  MG  --   --  2.0  AST 14*  --   --   ALT 7  --   --   ALKPHOS 73  --   --   BILITOT 0.5  --   --    < > = values in this interval not displayed.   RADIOLOGY:  No results found. ASSESSMENT AND PLAN:   1. Right lower extremity chronic wound with peripheral arterial disease. Patient reevaluated by vascular surgery who also discussed with patient and family.  Plan is for right AKA today  2.Right upper extremity swelling with fistula present.  Shuntogram scheduled done on 10/24/2018 by vascular surgery.  Fistula functioning well with reasonably good arterial flow to the hand and no severe arterial steal syndrome.  3.Uncontrolled hypertension.    Blood pressure better controlled.  IV hydralazine PRN.  4.End-stage renal disease on hemodialysis.   Nephrology service following.  Continue inpatient hemodialysis  5.Chronic diastolic CHF.  Stable.  DVT prophylaxis with heparin   All the records are reviewed and case discussed with Care Management/Social Worker. Management  plans discussed with the patient, family and they are in agreement.  CODE STATUS: Full Code  TOTAL TIME TAKING CARE OF THIS PATIENT: 34 minutes.   More than 50% of the time was spent in counseling/coordination of care: YES  POSSIBLE D/C IN 2 DAYS, DEPENDING ON CLINICAL CONDITION.   Aniello Christopoulos M.D on 10/25/2018 at 12:58 PM  Between 7am to 6pm - Pager - [phone]  After 6pm go to [email or web address] - Proofreader  [facility]  Office  [phone]  CC: Primary care physician; [name], MD  Note: This dictation was prepared with Dragon dictation along with smaller phrase technology. Any transcriptional errors that result from this process are unintentional.

## 2018-10-25 NOTE — Op Note (Signed)
  [name]   OPERATIVE NOTE   PROCEDURE:  Right above-the-knee amputation  PRE-OPERATIVE DIAGNOSIS: Right leg wound, PAD, ESRD  POST-OPERATIVE DIAGNOSIS: same as above  SURGEON:  Leotis Pain, MD  ASSISTANT(S): Hezzie Bump, PA-C  ANESTHESIA: general  ESTIMATED BLOOD LOSS: 25 cc  FINDING(S): none  SPECIMEN(S):  Right above-the-knee amputation  INDICATIONS:   Johnny Steve. is a 68 y.o. male who presents with right leg with nonhealing wound despite multiple previous revascularizations.  He has intractable pain.  His wound has been longstanding for over a year without healing.  The patient is scheduled for a right above-the-knee amputation.  I discussed in depth with the patient the risks, benefits, and alternatives to this procedure.  The patient is aware that the risk of this operation included but are not limited to:  bleeding, infection, myocardial infarction, stroke, death, failure to heal amputation wound, and possible need for more proximal amputation.  The patient is aware of the risks and agrees proceed forward with the procedure. An assistant was present during the procedure to help facilitate the exposure and expedite the procedure.  DESCRIPTION: After full informed written consent was obtained from the patient, the patient was taken to the operating room, and placed supine upon the operating table.  Prior to induction, the patient received IV antibiotics.  The patient was then prepped and draped in the standard fashion for a right above-the-knee amputation.  After obtaining adequate anesthesia, the patient was prepped and draped in the standard fashion for a above-the-knee amputation. The assistant provided retraction and mobilization to help facilitate exposure and expedite the procedure throughout the entire procedure.  This included following suture, using retractors, and optimizing lighting. I marked out the anterior and posterior flaps for a  fish-mouth type of amputation.  I made the incisions for these flaps, and then dissected through the subcutaneous tissue, fascia, and muscles circumferentially.  I elevated  the periosteal tissue 4-5 cm more proximal than the anterior skin flap.  I then transected the femur with a power saw at this level.  Then I smoothed out the rough edges of the bone.  At this point, the specimen was passed off the field as the above-the-knee amputation.  At this point, I clamped all visibly bleeding arteries and veins using a combination of suture ligation with silk suture and electrocautery.   Bleeding continued to be controlled with electrocautery and suture ligature.  The stump was washed off with sterile normal saline and no further active bleeding was noted.  I reapproximated the anterior and posterior fascia  with interrupted stitches of 0 Vicryl.  This was completed along the entire length of anterior and posterior fascia until there were no more loose space in the fascial line. The subcutaneous tissue was then approximated with 2-0 vicryl sutures. The skin was then  reapproximated with staples.  The stump was washed off and dried.  The incision was dressed with Xeroform and ABD pads, and  then fluffs were applied.  Kerlix was wrapped around the leg and then gently an ACE wrap was applied.  A large Ioban was then placed over the ACE wrap to secure the dressing. The patient was then awakened and take to the recovery room in stable condition.   COMPLICATIONS: none  CONDITION: stable  Leotis Pain  10/25/2018, 3:02 PM   This note was created with Dragon Medical transcription system. Any errors in dictation are purely unintentional.

## 2018-10-25 NOTE — Progress Notes (Signed)
[facility] notified of surgery time of 13:14. Terrial Rhodes

## 2018-10-25 NOTE — Anesthesia Post-op Follow-up Note (Signed)
Anesthesia QCDR form completed.        

## 2018-10-25 NOTE — Anesthesia Preprocedure Evaluation (Addendum)
Anesthesia Evaluation  Patient identified by MRN, date of birth, ID band Patient awake    Reviewed: Allergy & Precautions, H&P , NPO status , reviewed documented beta blocker date and time   Airway Mallampati: II  TM Distance: >3 FB Neck ROM: full    Dental  (+) Upper Dentures, Lower Dentures   Pulmonary former smoker,    Pulmonary exam normal        Cardiovascular hypertension, + Past MI, + Peripheral Vascular Disease and +CHF  Normal cardiovascular exam  Nml EF on ECHO   Neuro/Psych PSYCHIATRIC DISORDERS Anxiety  Neuromuscular disease    GI/Hepatic   Endo/Other  diabetes  Renal/GU Dialysis and ESRFRenal diseaseDialysis yesterday     Musculoskeletal   Abdominal   Peds  Hematology  (+) Blood dyscrasia, anemia ,   Anesthesia Other Findings Past Medical History: No date: Anemia No date: Anxiety No date: CHF (congestive heart failure) (HCC) No date: Chronic kidney disease     Comment:  esrd dialysis m/w/f No date: Gout No date: Hyperlipidemia No date: Hypertension 2010: Myocardial infarction [facility])     Comment:  10 years ago 2020: Neuromuscular disorder ([place])     Comment:  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 No date: Peripheral vascular disease [facility]) Past Surgical History: 09/06/2018: A/V FISTULAGRAM; Right     Comment:  Procedure: A/V FISTULAGRAM;  Surgeon: Algernon Huxley, MD;               Location: [place] CV LAB;  Service: Cardiovascular;              Laterality: Right; 06/21/2017: A/V SHUNTOGRAM; Left     Comment:  Procedure: A/V SHUNTOGRAM;  Surgeon: Katha Cabal,              MD;  Location: [place] CV LAB;  Service:               Cardiovascular;  Laterality: Left; 10/24/2018: A/V SHUNTOGRAM; N/A     Comment:  Procedure: A/V SHUNTOGRAM;  Surgeon: Algernon Huxley, MD;                Location: [place] [facility] LAB;  Service: Cardiovascular;              Laterality: N/A; 82/08/2351:  APPLICATION OF WOUND VAC; Right     Comment:  Procedure: APPLICATION OF WOUND VAC;  Surgeon: Algernon Huxley, MD;  Location: [facility] ORS;  Service: Vascular;                Laterality: Right; 09/18/2015: AV FISTULA PLACEMENT; Left     Comment:  Procedure: INSERTION OF ARTERIOVENOUS (AV) GORE-TEX               GRAFT ARM ( BRACH/AXILLARY GRAFT W/ INSTANT STICK GRAFT               );  Surgeon: Katha Cabal, MD;  Location: [facility] ORS;               Service: Vascular;  Laterality: Left; 07/19/2018: AV FISTULA PLACEMENT; Right     Comment:  Procedure: INSERTION OF ARTERIOVENOUS (AV) GORE-TEX               GRAFT ARM ( BRACHIAL AXILLARY);  Surgeon: Algernon Huxley,               MD;  Location: [facility] ORS;  Service: Vascular;  Laterality:              Right; 10/2017: DIALYSIS FISTULA CREATION; Right     Comment:  right chest perm cath 09/13/2018: DIALYSIS/PERMA CATHETER REMOVAL; N/A     Comment:  Procedure: DIALYSIS/PERMA CATHETER REMOVAL;  Surgeon:               Algernon Huxley, MD;  Location: [place] [facility] LAB;                Service: Cardiovascular;  Laterality: N/A; 12/19/2017: ESOPHAGOGASTRODUODENOSCOPY; N/A     Comment:  Procedure: ESOPHAGOGASTRODUODENOSCOPY (EGD);  Surgeon:               Lin Landsman, MD;  Location: [facility] ENDOSCOPY;                Service: Gastroenterology;  Laterality: N/A; 11/16/2017: LOWER EXTREMITY ANGIOGRAPHY; Left     Comment:  Procedure: LOWER EXTREMITY ANGIOGRAPHY;  Surgeon: Algernon Huxley, MD;  Location: [place] [facility] LAB;  Service:               Cardiovascular;  Laterality: Left; 01/18/2018: LOWER EXTREMITY ANGIOGRAPHY; Right     Comment:  Procedure: LOWER EXTREMITY ANGIOGRAPHY;  Surgeon: Algernon Huxley, MD;  Location: [place] [facility] LAB;  Service:               Cardiovascular;  Laterality: Right; 04/02/2018: LOWER EXTREMITY ANGIOGRAPHY; Left     Comment:  Procedure: LOWER EXTREMITY ANGIOGRAPHY;  Surgeon: Algernon Huxley, MD;  Location: [place] [facility] LAB;  Service:               Cardiovascular;  Laterality: Left; 04/09/2018: LOWER EXTREMITY ANGIOGRAPHY; Right     Comment:  Procedure: Lower Extremity Angiography with possible               intervention;  Surgeon: Algernon Huxley, MD;  Location: [place] [facility];  Service: Cardiovascular;  Laterality:               Right; 07/23/2018: LOWER EXTREMITY ANGIOGRAPHY; Right     Comment:  Procedure: Lower Extremity Angiography;  Surgeon: Algernon Huxley, MD;  Location: [place] [facility] LAB;  Service:               Cardiovascular;  Laterality: Right; 09/13/2018: LOWER EXTREMITY ANGIOGRAPHY; Right     Comment:  Procedure: LOWER EXTREMITY ANGIOGRAPHY;  Surgeon: Algernon Huxley, MD;  Location: [place] [facility] LAB;  Service:               Cardiovascular;  Laterality: Right; 09/13/2018: LOWER EXTREMITY VENOGRAPHY; Right     Comment:  Procedure: LOWER EXTREMITY VENOGRAPHY;  Surgeon: Algernon Huxley, MD;  Location: [place] [facility];  Service:               Cardiovascular;  Laterality: Right; 09/01/2015: PERIPHERAL VASCULAR CATHETERIZATION; Left  Comment:  Procedure: A/V Shuntogram/Fistulagram;  Surgeon: Katha Cabal, MD;  Location: [place] [facility] LAB;  Service:              Cardiovascular;  Laterality: Left; 09/30/2015: PERIPHERAL VASCULAR CATHETERIZATION; N/A     Comment:  Procedure: A/V Shuntogram/Fistulagram with perm               cathether removal;  Surgeon: Algernon Huxley, MD;  Location:               [place] CV LAB;  Service: Cardiovascular;                Laterality: N/A; 09/30/2015: PERIPHERAL VASCULAR CATHETERIZATION; Left     Comment:  Procedure: A/V Shunt Intervention;  Surgeon: Algernon Huxley, MD;  Location: [place] [facility] LAB;  Service:               Cardiovascular;  Laterality: Left; 12/03/2015: PERIPHERAL VASCULAR CATHETERIZATION; Left     Comment:  Procedure:  Thrombectomy;  Surgeon: Algernon Huxley, MD;                Location: [place] [facility];  Service: Cardiovascular;              Laterality: Left; 01/28/2016: PERIPHERAL VASCULAR CATHETERIZATION; Left     Comment:  Procedure: Thrombectomy;  Surgeon: Algernon Huxley, MD;                Location: [place] [facility];  Service: Cardiovascular;              Laterality: Left; 01/28/2016: PERIPHERAL VASCULAR CATHETERIZATION; N/A     Comment:  Procedure: A/V Shuntogram/Fistulagram;  Surgeon: Algernon Huxley, MD;  Location: [place] [facility] LAB;  Service:               Cardiovascular;  Laterality: N/A; 05/24/2018: SKIN SPLIT GRAFT; Right     Comment:  Procedure: SKIN GRAFT SPLIT THICKNESS ( RIGHT CALF);                Surgeon: Algernon Huxley, MD;  Location: [facility] ORS;  Service:              Vascular;  Laterality: Right; 10/24/2018: UPPER EXTREMITY ANGIOGRAPHY     Comment:  Procedure: Upper Extremity Angiography;  Surgeon: Algernon Huxley, MD;  Location: [place] [facility] LAB;  Service:               Cardiovascular;; 04/11/2018: WOUND DEBRIDEMENT; Right     Comment:  Procedure: DEBRIDEMENT WOUND calf muscle and skin;                Surgeon: Algernon Huxley, MD;  Location: [facility] ORS;  Service:              Vascular;  Laterality: Right; BMI    Body Mass Index: 23.23 kg/m     Reproductive/Obstetrics                          Anesthesia Physical Anesthesia Plan  ASA: IV  Anesthesia Plan: General   Post-op Pain Management:    Induction:  Intravenous  PONV Risk Score and Plan: 2 and Ondansetron, Midazolam and Metaclopromide  Airway Management Planned: LMA  Additional Equipment:   Intra-op Plan:   Post-operative Plan: Extubation in OR  Informed Consent: I have reviewed the patients History and Physical, chart, labs and discussed the procedure including the risks, benefits and alternatives for the proposed anesthesia with the patient or authorized representative  who has indicated his/her understanding and acceptance.     Dental Advisory Given  Plan Discussed with: CRNA  Anesthesia Plan Comments:         Anesthesia Quick Evaluation

## 2018-10-26 ENCOUNTER — Encounter: Payer: Self-pay | Admitting: Vascular Surgery

## 2018-10-26 DIAGNOSIS — Z9889 Other specified postprocedural states: Secondary | ICD-10-CM

## 2018-10-26 DIAGNOSIS — Z89611 Acquired absence of right leg above knee: Secondary | ICD-10-CM

## 2018-10-26 LAB — BASIC METABOLIC PANEL
Anion gap: 16 — ABNORMAL HIGH (ref 5–15)
BUN: 61 mg/dL — ABNORMAL HIGH (ref 8–23)
CO2: 25 mmol/L (ref 22–32)
Calcium: 8.1 mg/dL — ABNORMAL LOW (ref 8.9–10.3)
Chloride: 99 mmol/L (ref 98–111)
Creatinine, Ser: 7.95 mg/dL — ABNORMAL HIGH (ref 0.61–1.24)
GFR calc Af Amer: 7 mL/min — ABNORMAL LOW (ref >60)
GFR calc non Af Amer: 6 mL/min — ABNORMAL LOW (ref >60)
Glucose, Bld: 92 mg/dL (ref 70–99)
Potassium: 5.7 mmol/L — ABNORMAL HIGH (ref 3.5–5.1)
Sodium: 140 mmol/L (ref 135–145)

## 2018-10-26 LAB — CBC
HCT: 28.8 % — ABNORMAL LOW (ref 39.0–52.0)
Hemoglobin: 8.6 g/dL — ABNORMAL LOW (ref 13.0–17.0)
MCH: 26.7 pg (ref 26.0–34.0)
MCHC: 29.9 g/dL — ABNORMAL LOW (ref 30.0–36.0)
MCV: 89.4 fL (ref 80.0–100.0)
Platelets: 403 10*3/uL — ABNORMAL HIGH (ref 150–400)
RBC: 3.22 MIL/uL — ABNORMAL LOW (ref 4.22–5.81)
RDW: 15.6 % — ABNORMAL HIGH (ref 11.5–15.5)
WBC: 21.3 10*3/uL — ABNORMAL HIGH (ref 4.0–10.5)
nRBC: 0 % (ref 0.0–0.2)

## 2018-10-26 LAB — MAGNESIUM: Magnesium: 2 mg/dL (ref 1.7–2.4)

## 2018-10-26 LAB — PHOSPHORUS: Phosphorus: 7.4 mg/dL — ABNORMAL HIGH (ref 2.5–4.6)

## 2018-10-26 LAB — HEPATITIS B SURFACE ANTIGEN: Hepatitis B Surface Ag: NEGATIVE

## 2018-10-26 LAB — HEPATITIS B SURFACE ANTIBODY, QUANTITATIVE: Hep B S AB Quant (Post): 17.7 m[IU]/mL (ref >9.9)

## 2018-10-26 MED ORDER — EPOETIN ALFA 10000 UNIT/ML IJ SOLN
10000.0000 [IU] | INTRAMUSCULAR | Status: DC
  Administered 2018-10-26 – 2018-10-29 (×2): 10000 [IU] via INTRAVENOUS
  Filled 2018-10-26: qty 1

## 2018-10-26 MED ORDER — ASPIRIN EC 81 MG PO TBEC
81.0000 mg | DELAYED_RELEASE_TABLET | Freq: Every day | ORAL | Status: DC
  Administered 2018-10-26 – 2018-10-30 (×5): 81 mg via ORAL
  Filled 2018-10-26 (×5): qty 1

## 2018-10-26 NOTE — TOC Initial Note (Signed)
Transition of Care (TOC) - Initial/Assessment Note    Patient Details  Name: Johnny [name]. 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 Patient is well known to CSW and this CSW has placed patient for short term rehab at least 5 times but he always returns home after rehab. Patient will require rehab again as he has a new amputation. CSW spoke with patient's POA, [name]: [phone], and she is requesting rehab again. Patient typically goes to [facility] but has gone to [facility] in the past. CSW will initiate a bedsearch.            Expected Discharge Plan: Skilled Nursing Facility Barriers to Discharge: No Barriers Identified   Patient Goals and CMS Choice   CMS Medicare.gov Compare Post Acute Care list provided to:: Patient Represenative (must comment) Choice offered to / list presented to : [facility] POA / Guardian  Expected Discharge Plan and Services Expected Discharge Plan: [place]                                              Prior Living Arrangements/Services     Patient language and need for interpreter reviewed:: Yes        Need for Family Participation in Patient Care: Yes (Comment) Care giver support system in place?: No (comment)   Criminal Activity/Legal Involvement Pertinent to Current Situation/Hospitalization: No - Comment as needed  Activities of Daily Living Home Assistive Devices/Equipment: None ADL Screening (condition at time of admission) Patient's cognitive ability adequate to safely complete daily activities?: Yes Is the patient deaf or have difficulty hearing?: No Does the patient have difficulty seeing, even when wearing glasses/contacts?: No Does the patient have difficulty concentrating,  remembering, or making decisions?: No Patient able to express need for assistance with ADLs?: Yes Does the patient have difficulty dressing or bathing?: No Independently performs ADLs?: Yes (appropriate for developmental age) Does the patient have difficulty walking or climbing stairs?: Yes Weakness of Legs: Both Weakness of Arms/Hands: Both  Permission Sought/Granted                  Emotional Assessment Appearance:: Appears stated age     Orientation: : Oriented to Self, Oriented to Place Alcohol / Substance Use: Not Applicable Psych Involvement: No (comment)  Admission diagnosis:  Swelling [R60.9] Edema of upper extremity [R60.0] Cellulitis of right lower leg [L03.115] Patient Active Problem List   Diagnosis Date Noted  . Leg ulcer, right, with fat layer exposed ([place]) 10/22/2018  . Cellulitis of right leg 07/21/2018  . Lower limb ulcer, calf, right, limited to breakdown of skin ([place]) 06/08/2018  . Malnutrition of moderate degree 04/11/2018  . Pressure injury of skin 04/06/2018  . Altered mental status 04/04/2018  . Hypothermia 04/04/2018  . Hyperlipidemia 02/27/2018  . Diabetes ([place]) 02/27/2018  . Weakness of right lower extremity 01/20/2018  . Fever   . Periumbilical abdominal pain   . Confusion 12/22/2017  . Acute delirium 12/21/2017  . Protein-calorie malnutrition, severe 12/19/2017  . Intractable nausea and vomiting 12/18/2017  . Lymphedema 12/13/2017  . Cellulitis 11/27/2017  . Chest pain 11/19/2017  . Atherosclerosis of  native arteries of the extremities with ulceration ([place]) 11/07/2017  . Elevated troponin 10/02/2015  . Complications, dialysis, catheter, mechanical ([place]) 10/02/2015  . Musculoskeletal chest pain 09/28/2015  . ESRD on dialysis ([place]) 09/28/2015  . HTN (hypertension) 09/28/2015  . Chronic diastolic CHF (congestive heart failure) ([place]) 09/28/2015  . Gout 09/28/2015   PCP:  [name], MD Pharmacy:   [place], [place] [address] [place] [address] Phone: [phone] Fax: [phone]     Social Determinants of Health (SDOH) Interventions    Readmission Risk Interventions No flowsheet data found.

## 2018-10-26 NOTE — Progress Notes (Signed)
HD Tx Start   10/26/18 1430  Hand-Off documentation  Report given to (Full Name) Trellis Paganini RN  Report received from (Full Name) Beth 2C RN  Vital Signs  Temp 98 F (36.7 C)  Temp Source Oral  Pulse Rate 75  Pulse Rate Source Monitor  Resp 11  BP (!) 179/58  BP Location Left Arm  BP Method Automatic  Patient Position (if appropriate) Lying  Oxygen Therapy  SpO2 100 %  O2 Device Room Air  Pain Assessment  Pain Scale 0-10  Pain Score 10  Pain Type Surgical pain  Pain Location Leg  Pain Orientation Right  Pain Descriptors / Indicators Sharp  Pain Intervention(s) Repositioned (recently given medication)  Dialysis Weight  Weight 69.3 kg  Type of Weight Pre-Dialysis  Time-Out for Hemodialysis  What Procedure? Hemodialysis  Pt Identifiers(min of two) First/Last Name;MRN/Account#  Correct Site? Yes  Correct Side? Yes  Correct Procedure? Yes  Consents Verified? Yes  Rad Studies Available? N/A  Safety Precautions Reviewed? Yes  Engineer, civil (consulting) Number 1  Station Number 1  UF/Alarm Test Passed  Conductivity: Meter 14  Conductivity: Machine  13.8  pH 7.2  Reverse Osmosis Main  Normal Saline Lot Number [id number]  Dialyzer Lot Number [id number]  Disposable Set Lot Number [id number]  Machine Temperature 98.6 F (37 C)  Musician and Audible Yes  Blood Lines Intact and Secured Yes  Pre Treatment Patient Checks  Vascular access used during treatment Fistula  Hepatitis B Surface Antigen Results Negative  Date Hepatitis B Surface Antigen Drawn 10/25/18  Hepatitis B Surface Antibody 17.7  Date Hepatitis B Surface Antibody Drawn 10/25/18  Hemodialysis Consent Verified Yes  Hemodialysis Standing Orders Initiated Yes  ECG (Telemetry) Monitor On Yes  Prime Ordered Normal Saline  Length of  DialysisTreatment -hour(s) 3 Hour(s)  Dialysis Treatment Comments Na 140  Dialyzer Elisio 17H NR  Dialysate 2K;2.5 Ca  Dialysate Flow Ordered 600  Blood Flow Rate Ordered 400  mL/min  Ultrafiltration Goal 2 Liters  Dialysis Blood Pressure Support Ordered Normal Saline  During Hemodialysis Assessment  Blood Flow Rate (mL/min) 400 mL/min  Arterial Pressure (mmHg) -150 mmHg  Venous Pressure (mmHg) 160 mmHg  Transmembrane Pressure (mmHg) 50 mmHg  Ultrafiltration Rate (mL/min) 830 mL/min (830 mL per HOUR)  Dialysate Flow Rate (mL/min) 600 ml/min  Conductivity: Machine  14  HD Safety Checks Performed Yes  Dialysis Fluid Bolus Normal Saline  Bolus Amount (mL) 250 mL  Intra-Hemodialysis Comments Tx initiated  Fistula / Graft Right Upper arm Arteriovenous vein graft  Placement Date/Time: 07/19/18 1324   Placed prior to admission: No  Orientation: Right  Access Location: (c) Upper arm  Access Type: Arteriovenous vein graft  Site Condition No complications  Fistula / Graft Assessment Present;Thrill;Bruit  Status Accessed  Needle Size 15  Drainage Description None

## 2018-10-26 NOTE — NC FL2 (Signed)
[place] LEVEL OF CARE SCREENING TOOL     IDENTIFICATION  Patient Name: Johnny [name]. Birthdate: 01/04/1951 Sex: male Admission Date (Current Location): 10/22/2018  [place] and [place] Number:  Engineering geologist and Address:  [facility], [address] [place], [place] [address]      Provider Number: [id number]  Attending Physician Name and Address:  Otila Back, MD  Relative Name and Phone Number:       Current Level of Care: Hospital Recommended Level of Care: [place] Prior Approval Number:    Date Approved/Denied:   PASRR Number:    Discharge Plan: SNF    Current Diagnoses: Patient Active Problem List   Diagnosis Date Noted  . Leg ulcer, right, with fat layer exposed ([place]) 10/22/2018  . Cellulitis of right leg 07/21/2018  . Lower limb ulcer, calf, right, limited to breakdown of skin ([place]) 06/08/2018  . Malnutrition of moderate degree 04/11/2018  . Pressure injury of skin 04/06/2018  . Altered mental status 04/04/2018  . Hypothermia 04/04/2018  . Hyperlipidemia 02/27/2018  . Diabetes ([place]) 02/27/2018  . Weakness of right lower extremity 01/20/2018  . Fever   . Periumbilical abdominal pain   . Confusion 12/22/2017  . Acute delirium 12/21/2017  . Protein-calorie malnutrition, severe 12/19/2017  . Intractable nausea and vomiting 12/18/2017  . Lymphedema 12/13/2017  . Cellulitis 11/27/2017  . Chest pain 11/19/2017  . Atherosclerosis of native arteries of the extremities with ulceration ([place]) 11/07/2017  . Elevated troponin 10/02/2015  . Complications, dialysis, catheter, mechanical ([place]) 10/02/2015  . Musculoskeletal chest pain 09/28/2015  . ESRD on dialysis ([place]) 09/28/2015  . HTN (hypertension) 09/28/2015  . Chronic diastolic CHF (congestive heart failure) ([place]) 09/28/2015  . Gout 09/28/2015    Orientation RESPIRATION BLADDER Height & Weight     Self, Place  Normal Continent Weight: 152 lb  12.5 oz (69.3 kg) Height:  5\' 8"  (172.7 cm)  BEHAVIORAL SYMPTOMS/MOOD NEUROLOGICAL BOWEL NUTRITION STATUS  (none) (none) Continent    AMBULATORY STATUS COMMUNICATION OF NEEDS Skin   Total Care Verbally Surgical wounds                       Personal Care Assistance Level of Assistance  Bathing, Dressing Bathing Assistance: Maximum assistance   Dressing Assistance: Maximum assistance     Functional Limitations Info             SPECIAL CARE FACTORS FREQUENCY  PT (By licensed PT)                    Contractures Contractures Info: Not present    Additional Factors Info  (outpatient hemodialysis)               Current Medications (10/26/2018):  This is the current hospital active medication list Current Facility-Administered Medications  Medication Dose Route Frequency Provider Last Rate Last Dose  . acetaminophen (TYLENOL) tablet 650 mg  650 mg Oral Q6H PRN Algernon Huxley, MD       Or  . acetaminophen (TYLENOL) suppository 650 mg  650 mg Rectal Q6H PRN Algernon Huxley, MD      . albuterol (PROVENTIL) (2.5 MG/3ML) 0.083% nebulizer solution 2.5 mg  2.5 mg Nebulization Q2H PRN Algernon Huxley, MD      . allopurinol (ZYLOPRIM) tablet 100 mg  100 mg Oral Daily Algernon Huxley, MD   100 mg at 10/26/18 0851  . aspirin EC  tablet 81 mg  81 mg Oral Daily Stegmayer, Kimberly A, PA-C   81 mg at 10/26/18 1250  . atorvastatin (LIPITOR) tablet 10 mg  10 mg Oral Daily Algernon Huxley, MD   10 mg at 10/26/18 0851  . clonazePAM (KLONOPIN) tablet 0.25 mg  0.25 mg Oral BID PRN Algernon Huxley, MD   0.25 mg at 10/24/18 2302  . clopidogrel (PLAVIX) tablet 75 mg  75 mg Oral Daily Algernon Huxley, MD   75 mg at 10/26/18 0851  . [START ON 10/29/2018] epoetin alfa (EPOGEN) injection 10,000 Units  10,000 Units Intravenous Q M,W,F-HD Kolluru, Sarath, MD      . feeding supplement (NEPRO CARB STEADY) liquid 237 mL  237 mL Oral TID BM Algernon Huxley, MD 0 mL/hr at 10/26/18 0000 237 mL at 10/26/18 1250  .  gabapentin (NEURONTIN) capsule 100 mg  100 mg Oral BID Algernon Huxley, MD   100 mg at 10/26/18 0851  . heparin injection 5,000 Units  5,000 Units Subcutaneous Q8H Algernon Huxley, MD   5,000 Units at 10/26/18 0558  . hydrALAZINE (APRESOLINE) injection 10 mg  10 mg Intravenous Q6H PRN Algernon Huxley, MD      . HYDROcodone-acetaminophen (NORCO/VICODIN) 5-325 MG per tablet 1 tablet  1 tablet Oral Q6H PRN Algernon Huxley, MD   1 tablet at 10/26/18 1250  . losartan (COZAAR) tablet 100 mg  100 mg Oral Daily Algernon Huxley, MD   100 mg at 10/25/18 1007  . metoprolol tartrate (LOPRESSOR) tablet 25 mg  25 mg Oral BID Algernon Huxley, MD   25 mg at 10/25/18 2202  . morphine 2 MG/ML injection 2 mg  2 mg Intravenous Q4H PRN Algernon Huxley, MD   2 mg at 10/26/18 0850  . multivitamin (RENA-VIT) tablet 1 tablet  1 tablet Oral QHS Algernon Huxley, MD   1 tablet at 10/25/18 2202  . multivitamin-lutein (OCUVITE-LUTEIN) capsule 1 capsule  1 capsule Oral Daily Algernon Huxley, MD   1 capsule at 10/26/18 1250  . ondansetron (ZOFRAN) tablet 4 mg  4 mg Oral Q6H PRN Algernon Huxley, MD       Or  . ondansetron (ZOFRAN) injection 4 mg  4 mg Intravenous Q6H PRN Algernon Huxley, MD      . polyethylene glycol (MIRALAX / GLYCOLAX) packet 17 g  17 g Oral Daily PRN Algernon Huxley, MD      . sevelamer carbonate (RENVELA) tablet 2,400 mg  2,400 mg Oral TID WC Algernon Huxley, MD   2,400 mg at 10/26/18 0851  . vitamin C (ASCORBIC ACID) tablet 250 mg  250 mg Oral BID Algernon Huxley, MD   250 mg at 10/26/18 2956     Discharge Medications: Please see discharge summary for a list of discharge medications.  Relevant Imaging Results:  Relevant Lab Results:   Additional Information [id number]  Shela Leff, LCSW

## 2018-10-26 NOTE — Progress Notes (Signed)
Spoke with patient's POA, [name] and updated on patient's condition.  Patient was initially confused this a.m. but then on reassessment once patient was more awake seemed oriented x3.  However this waxes and wanes and patient confused again this afternoon.  Questions answered and [name] voiced appreciation for the update.

## 2018-10-26 NOTE — Progress Notes (Signed)
Post HD Assessment  413mL net fluid removal today. Pt had short episode of hypotension and SV tach into the 120s during HD tx. UF turned off, blood flow rate decreased, and pt rebounded quickly to complete treatment without further fluid removal. Stable, yet in extreme pain related to AKA.     10/26/18 1726  Neurological  Level of Consciousness Responds to Voice  Orientation Level Oriented to person (unable to name day/time/place.only name/DOB)  Respiratory  Respiratory Pattern Regular  Chest Assessment Chest expansion symmetrical  Cardiac  Pulse Regular  Heart Sounds S1, S2  Jugular Venous Distention (JVD) No  ECG Monitor Yes  Cardiac Rhythm NSR  Ectopy Multifocal PVC's  Ectopy Frequency Frequent  Vascular  R Radial Pulse +1  L Radial Pulse +1  Edema Generalized  Integumentary  Integumentary (WDL) X  Skin Color Appropriate for ethnicity  Skin Condition Dry  Skin Integrity Surgical Incision (see LDA)  Musculoskeletal  Musculoskeletal (WDL) X  Generalized Weakness Yes  GU Assessment  Genitourinary (WDL) X  Genitourinary Symptoms Anuria  Psychosocial  Psychosocial (WDL) X  Patient Behaviors Cooperative;Calm (extreme pain made him somewhat agitated (surgical pain))  Needs Expressed Physical  Emotional support given Given to patient

## 2018-10-26 NOTE — Progress Notes (Signed)
Pre HD Assessment   10/26/18 1500  Neurological  Level of Consciousness Responds to Voice  Orientation Level Oriented to person (unable to name day/time/place.only name/DOB)  Respiratory  Respiratory Pattern Regular  Chest Assessment Chest expansion symmetrical  Cardiac  Pulse Regular  Heart Sounds S1, S2  Jugular Venous Distention (JVD) No  ECG Monitor Yes  Cardiac Rhythm NSR  Ectopy Multifocal PVC's  Ectopy Frequency Frequent  Vascular  R Radial Pulse +1  L Radial Pulse +1  Edema Generalized  Integumentary  Integumentary (WDL) X  Skin Color Appropriate for ethnicity  Skin Condition Dry  Skin Integrity Surgical Incision (see LDA)  Musculoskeletal  Musculoskeletal (WDL) X  Generalized Weakness Yes  GU Assessment  Genitourinary (WDL) X  Genitourinary Symptoms Anuria  Psychosocial  Psychosocial (WDL) X  Patient Behaviors Cooperative;Calm (extreme pain made him somewhat agitated (surgical pain))  Needs Expressed Physical  Emotional support given Given to patient

## 2018-10-26 NOTE — Progress Notes (Signed)
[facility]  ROUNDING NOTE   Subjective:   Right above the knee amputation Dr. Lucky Cowboy 6/18  Objective:  Vital signs in last 24 hours:  Temp:  [97.8 F (36.6 C)-98.4 F (36.9 C)] 98 F (36.7 C) (06/19 0500) Pulse Rate:  [72-91] 75 (06/19 0500) Resp:  [12-21] 20 (06/19 0500) BP: (132-165)/(44-72) 142/63 (06/19 0500) SpO2:  [97 %-100 %] 99 % (06/19 0500)  Weight change:  Filed Weights   10/24/18 1710 10/24/18 2045 10/25/18 0500  Weight: 70.5 kg 69.8 kg 69.3 kg    Intake/Output: I/O last 3 completed shifts: In: 300 [I.V.:250; IV Piggyback:50] Out: 220 [Other:513]   Intake/Output this shift:  No intake/output data recorded.  Physical Exam: General: NAD,   Head: Normocephalic, atraumatic. Moist oral mucosal membranes  Eyes: Anicteric, PERRL  Neck: Supple, trachea midline  Lungs:  Clear to auscultation  Heart: Regular rate and rhythm  Abdomen:  Soft, nontender,   Extremities:  wound dressing clean and dry  Neurologic: Nonfocal, moving all four extremities  Skin: No lesions  Access: Right arm AVG    Basic Metabolic Panel: Recent Labs  Lab 10/22/18 1948 10/23/18 0814 10/25/18 0950 10/26/18 1010  NA 139 138 139 140  K 4.9 5.2* 5.6* 5.7*  CL 99 100 97* 99  CO2 24 25 27 25   GLUCOSE 85 76 130* 92  BUN 40* 46* 42* 61*  CREATININE 6.62* 7.72* 6.46* 7.95*  CALCIUM 7.9* 7.9* 8.4* 8.1*  MG  --   --  2.0 2.0  PHOS  --   --   --  7.4*    Liver Function Tests: Recent Labs  Lab 10/22/18 1948  AST 14*  ALT 7  ALKPHOS 73  BILITOT 0.5  PROT 6.8  ALBUMIN 2.7*   No results for input(s): LIPASE, AMYLASE in the last 168 hours. No results for input(s): AMMONIA in the last 168 hours.  CBC: Recent Labs  Lab 10/22/18 1948 10/23/18 0814 10/25/18 0950 10/26/18 1010  WBC 17.2* 13.8* 24.5* 21.3*  NEUTROABS 13.8*  --   --   --   HGB 8.9* 8.1* 10.8* 8.6*  HCT 29.8* 27.5* 36.4* 28.8*  MCV 90.0 91.1 89.7 89.4  PLT 249 288 384 403*    Cardiac  Enzymes: No results for input(s): CKTOTAL, CKMB, CKMBINDEX, TROPONINI in the last 168 hours.  BNP: Invalid input(s): POCBNP  CBG: No results for input(s): GLUCAP in the last 168 hours.  Microbiology: Results for orders placed or performed during the hospital encounter of 10/22/18  MRSA PCR Screening     Status: None   Collection Time: 10/22/18  2:16 PM   Specimen: Nasal Mucosa; Nasopharyngeal  Result Value Ref Range Status   MRSA by PCR NEGATIVE NEGATIVE Final    Comment:        The GeneXpert MRSA Assay (FDA approved for NASAL specimens only), is one component of a comprehensive MRSA colonization surveillance program. It is not intended to diagnose MRSA infection nor to guide or monitor treatment for MRSA infections. Performed at [facility], [address] [place], [place] [address]   Novel Coronavirus,NAA,(SEND-OUT TO REF LAB - TAT 24-48 hrs); Hosp Order     Status: None   Collection Time: 10/22/18  3:52 PM   Specimen: Nasopharyngeal Swab; Respiratory  Result Value Ref Range Status   SARS-CoV-2, NAA NOT DETECTED NOT DETECTED Final    Comment: (NOTE) This test was developed and its performance characteristics determined by [employer]. This test has not been  FDA cleared or approved. This test has been authorized by FDA under an Emergency Use Authorization (EUA). This test is only authorized for the duration of time the declaration that circumstances exist justifying the authorization of the emergency use of in vitro diagnostic tests for detection of SARS-CoV-2 virus and/or diagnosis of COVID-19 infection under section 564(b)(1) of the Act, 21 U.S.C. 935TSV-7(B)(9), unless the authorization is terminated or revoked sooner. When diagnostic testing is negative, the possibility of a false negative result should be considered in the context of a patient's recent exposures and the presence of clinical signs and symptoms consistent with COVID-19. An  individual without symptoms of COVID-19 and who is not shedding SARS-CoV-2 virus would expect to have a negative (not detected) result in this assay. Performed  At: [facility] [address] [place], [place] [id number] Rush Farmer MD RA:[id number]    [place]  Final    Comment: Performed at [facility], [address] [place], [place] [address]  Blood Culture (routine x 2)     Status: None (Preliminary result)   Collection Time: 10/23/18  8:15 AM   Specimen: BLOOD  Result Value Ref Range Status   Specimen Description BLOOD BLOOD LEFT HAND  Final   Special Requests   Final    BOTTLES DRAWN AEROBIC AND ANAEROBIC Blood Culture adequate volume   Culture   Final    NO GROWTH 3 DAYS Performed at [facility], [address] [place], [place] [address]    Report Status PENDING  Incomplete  Blood Culture (routine x 2)     Status: None (Preliminary result)   Collection Time: 10/23/18  8:32 AM   Specimen: BLOOD  Result Value Ref Range Status   Specimen Description BLOOD BLOOD RIGHT WRIST  Final   Special Requests   Final    BOTTLES DRAWN AEROBIC AND ANAEROBIC Blood Culture adequate volume   Culture   Final    NO GROWTH 3 DAYS Performed at [facility], [address], [place], [place] [address]    Report Status PENDING  Incomplete    Coagulation Studies: Recent Labs    10/25/18 0950  LABPROT 13.9  INR 1.1    Urinalysis: No results for input(s): COLORURINE, LABSPEC, PHURINE, GLUCOSEU, HGBUR, BILIRUBINUR, KETONESUR, PROTEINUR, UROBILINOGEN, NITRITE, LEUKOCYTESUR in the last 72 hours.  Invalid input(s): APPERANCEUR    Imaging: No results found.   Medications:    . allopurinol  100 mg Oral Daily  . aspirin EC  81 mg Oral Daily  . atorvastatin  10 mg Oral Daily  . clopidogrel  75 mg Oral Daily  . feeding supplement (NEPRO CARB STEADY)  237 mL Oral TID BM  . gabapentin  100 mg Oral BID  . heparin  injection (subcutaneous)  5,000 Units Subcutaneous Q8H  . losartan  100 mg Oral Daily  . metoprolol tartrate  25 mg Oral BID  . multivitamin  1 tablet Oral QHS  . multivitamin-lutein  1 capsule Oral Daily  . sevelamer carbonate  2,400 mg Oral TID WC  . vitamin C  250 mg Oral BID   acetaminophen **OR** acetaminophen, albuterol, clonazePAM, hydrALAZINE, HYDROcodone-acetaminophen, morphine injection, ondansetron **OR** ondansetron (ZOFRAN) IV, polyethylene glycol  Assessment/ Plan:  [name] is a 68 y.o. black male with end stage renal disease on hemodialysis, hypertension, peripheral vascular disease, congestive heart failure   [address] MWF 66kg Right AVG   1. End Stage Renal Disease:  Complication with  dialysis access. Concerning for central stenosis causing edema and steal syndrome causing pain and numbness. Status post angiogram 6/17 Dr. Lucky Cowboy with balloon angioplasty  Appreciate vascular input  - MWF schedule. Dialysis for later today  2. Hypertension:  - Continue metoprolol and losartan.   3. Anemia of chronic kidney disease: hemoglobin 8.6 - EPO with HD treatment  4. Secondary Hyperparathyroidism: labs from 6/8 PTH elevated at 750. Phosphorus 3.8, calcium 9.4 - Continue sevelamer with meals.   5. Peripheral vascular disease with cellulitis - Appreciate vascular input.  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 Lucky Cowboy - empiric cefepime and vanco   LOS: 4 Johnny [name] 6/19/20201:06 PM

## 2018-10-26 NOTE — Progress Notes (Signed)
PT Cancellation Note  Patient Details Name: Johnny [name]. MRN: [id number] DOB: Aug 11, 1950   Cancelled Treatment:    Reason Eval/Treat Not Completed: Patient at procedure or test/unavailable Pt off floor for dialysis this afternoon, unable to perform PT exam.  Will attempt tomorrow as appropriate.   Kreg Shropshire, DPT 10/26/2018, 3:29 PM

## 2018-10-26 NOTE — Progress Notes (Signed)
[facility] NAME: Johnny [name]    MR#:  [id number]  DATE OF BIRTH:  29-May-1950  SUBJECTIVE:  CHIEF COMPLAINT:   Chief Complaint  Patient presents with  . Wound Infection    No fevers.  Patient right above-knee amputation done on 10/25/2018.  Complaint of pain this morning and was given pain meds.    REVIEW OF SYSTEMS:  Review of Systems  Constitutional: Negative for chills and fever.  HENT: Negative for hearing loss and tinnitus.   Eyes: Negative for blurred vision and double vision.  Respiratory: Negative for cough and shortness of breath.   Cardiovascular: Negative for chest pain and palpitations.  Gastrointestinal: Negative for heartburn, nausea and vomiting.  Genitourinary: Negative for dysuria and urgency.  Musculoskeletal: Negative for myalgias and neck pain.       Some pain in bilateral lower extremity  Skin: Negative for itching and rash.  Neurological: Negative for dizziness and headaches.  Psychiatric/Behavioral: Negative for depression and hallucinations.    DRUG ALLERGIES:   Allergies  Allergen Reactions  . Shellfish Allergy Anaphylaxis   VITALS:  Blood pressure (!) 167/71, pulse 73, temperature 97.9 F (36.6 C), temperature source Oral, resp. rate 20, height 5\' 8"  (1.727 m), weight 69.3 kg, SpO2 99 %. PHYSICAL EXAMINATION:  Physical Exam  GENERAL:  68 y.o.-year-old patient lying in the bed with no acute distress.  EYES: Pupils equal, round, reactive to light and accommodation. No scleral icterus. Extraocular muscles intact.  HEENT: Head atraumatic, normocephalic. Oropharynx and nasopharynx clear. No oropharyngeal erythema, moist oral mucosa  NECK:  Supple, no jugular venous distention. No thyroid enlargement, no tenderness.  LUNGS: Normal breath sounds bilaterally, no wheezing, rales, rhonchi. No use of accessory muscles of respiration.  CARDIOVASCULAR: S1, S2 normal. No murmurs, rubs, or gallops.   ABDOMEN: Soft, nontender, nondistended. Bowel sounds present. No organomegaly or mass.  EXTREMITIES: Patient status post right AKA.  No edema left.[name]   NEUROLOGIC: Cranial nerves II through XII are intact. No focal Motor or sensory deficits appreciated b/l PSYCHIATRIC: The patient is alert and oriented x 3. Good affect.  SKIN: Large right lower extremity wound on his right heel and extending up on ventral leg.  Looks dry exposing the fat tissue.  No discharge or foul smell.   LABORATORY PANEL:  Male CBC Recent Labs  Lab 10/26/18 1010  WBC 21.3*  HGB 8.6*  HCT 28.8*  PLT 403*   ------------------------------------------------------------------------------------------------------------------ Chemistries  Recent Labs  Lab 10/22/18 1948  10/26/18 1010  NA 139   < > 140  K 4.9   < > 5.7*  CL 99   < > 99  CO2 24   < > 25  GLUCOSE 85   < > 92  BUN 40*   < > 61*  CREATININE 6.62*   < > 7.95*  CALCIUM 7.9*   < > 8.1*  MG  --    < > 2.0  AST 14*  --   --   ALT 7  --   --   ALKPHOS 73  --   --   BILITOT 0.5  --   --    < > = values in this interval not displayed.   RADIOLOGY:  No results found. ASSESSMENT AND PLAN:   1. Right lower extremity chronic wound with peripheral arterial disease. Patient reevaluated by vascular surgery who also discussed with patient and family.  Patient status post right AKA 10/25/2018.  2.Right upper extremity swelling with fistula present.  Shuntogram scheduled done on 10/24/2018 by vascular surgery.  Fistula functioning well with reasonably good arterial flow to the hand and no severe arterial steal syndrome.  3.Uncontrolled hypertension.    Blood pressure better controlled.  IV hydralazine PRN.  4.End-stage renal disease on hemodialysis.   Nephrology service following.  Continue inpatient hemodialysis  5.Chronic diastolic CHF.  Stable.  DVT prophylaxis with heparin  All the records are reviewed and case discussed with Care  Management/Social Worker. Management plans discussed with the patient, family and they are in agreement. Called patient's contact listed in the chart Ms. [name]; updated on treatment plans.  All questions answered.  CODE STATUS: Full Code  TOTAL TIME TAKING CARE OF THIS PATIENT: 34 minutes.   More than 50% of the time was spent in counseling/coordination of care: YES  POSSIBLE D/C IN 2 DAYS, DEPENDING ON CLINICAL CONDITION.   Farzad Tibbetts M.D on 10/26/2018 at 2:38 PM  Between 7am to 6pm - Pager - [phone]  After 6pm go to [email or web address] - Proofreader  [facility]  Office  [phone]  CC: Primary care physician; [name], MD  Note: This dictation was prepared with Dragon dictation along with smaller phrase technology. Any transcriptional errors that result from this process are unintentional.

## 2018-10-26 NOTE — Progress Notes (Signed)
[facility] Daily Progress Note   Subjective: 10/25/18: Right above-the-knee amputation  10/24/18: 1.  Right brachial artery to axillary vein arteriovenous graft cannulation under ultrasound guidance 2.  Right arm shuntogram 3.  Percutaneous transluminal angioplasty of right innominate vein with 10 and 12 mm diameter angioplasty balloons 4.  Stent placement to the right innominate vein with a 14 mm diameter by 6 cm length Venovo stent for greater than 50% residual stenosis after angioplasty 5.  Catheter placement into right radial artery, right ulnar artery, and proximal right brachial artery from access through the graft 6.  Right upper extremity angiogram  Patient with improved pain and numbness to right hand s/p intervention. No issues overnight. States stump is "sore".  Objective: Vitals:   10/25/18 1709 10/25/18 1804 10/25/18 2017 10/26/18 0500  BP: 135/63 (!) 149/59 (!) 144/56 (!) 142/63  Pulse: 72 75 80 75  Resp: 20 18 (!) 21 20  Temp: 97.8 F (36.6 C) 98 F (36.7 C) 98.2 F (36.8 C) 98 F (36.7 C)  TempSrc: Oral Oral Oral Oral  SpO2: 98% 98% 100% 99%  Weight:      Height:        Intake/Output Summary (Last 24 hours) at 10/26/2018 1235 Last data filed at 10/26/2018 0426 Gross per 24 hour  Intake 300 ml  Output 0 ml  Net 300 ml   Physical Exam: A&Ox3, NAD CV: RRR Pulmonary: CTA Bilaterally Abdomen: Soft, Nontender, Nondistended Vascular:   Right Upper Extremity: Slightly improved swelling. Upper arm / forearm soft. Extremity warm distally, cooler at the hand. Motor / sensory intact.  Right Lower Extremity: Thigh soft. Stump - OR dressing clean and dry.    Laboratory: CBC    Component Value Date/Time   WBC 21.3 (H) 10/26/2018 1010   HGB 8.6 (L) 10/26/2018 1010   HGB 8.8 (L) 01/17/2014 0502   HCT 28.8 (L) 10/26/2018 1010   HCT 28.1 (L) 02/28/2018 1432   PLT 403 (H) 10/26/2018 1010   PLT 212 01/17/2014 0502   BMET    Component Value  Date/Time   NA 140 10/26/2018 1010   NA 139 01/17/2014 0502   K 5.7 (H) 10/26/2018 1010   K 4.8 01/17/2014 0502   CL 99 10/26/2018 1010   CL 102 01/17/2014 0502   CO2 25 10/26/2018 1010   CO2 27 01/17/2014 0502   GLUCOSE 92 10/26/2018 1010   GLUCOSE 101 (H) 01/17/2014 0502   BUN 61 (H) 10/26/2018 1010   BUN 62 (H) 01/17/2014 0502   CREATININE 7.95 (H) 10/26/2018 1010   CREATININE 12.30 (H) 01/17/2014 0502   CALCIUM 8.1 (L) 10/26/2018 1010   CALCIUM 8.2 (L) 01/17/2014 0502   GFRNONAA 6 (L) 10/26/2018 1010   GFRNONAA 4 (L) 01/17/2014 0502   GFRAA 7 (L) 10/26/2018 1010   GFRAA 4 (L) 01/17/2014 0502   Assessment/Planning: The patient is a 68 year old male well-known to our practice with a past medical history of hypertension, hyperlipidemia, diabetes, congestive heart failure, anemia, myocardial infarction, neuromuscular disorder, end-stage renal disease on chronic hemodialysis, peripheral vascular disease with slow healing right lower extremity wound status post debridement status post skin graft who presents to the [facility] emergency department with a chief complaint of a right lower extremity wound. 1) s/p right AKA: two gram drop in Hbg - asymptomatic. Dressing dry. CBC in AM 2) Improved symptoms to RUE s/p endo intervention 3) Dressing change Sunday / Monday 4) Most likely SNF on dispo  Discussed with Dr. Ellis Parents Lodema Parma PA-C 10/26/2018 12:35 PM

## 2018-10-27 LAB — BASIC METABOLIC PANEL
Anion gap: 14 (ref 5–15)
BUN: 35 mg/dL — ABNORMAL HIGH (ref 8–23)
CO2: 29 mmol/L (ref 22–32)
Calcium: 8.1 mg/dL — ABNORMAL LOW (ref 8.9–10.3)
Chloride: 96 mmol/L — ABNORMAL LOW (ref 98–111)
Creatinine, Ser: 5.52 mg/dL — ABNORMAL HIGH (ref 0.61–1.24)
GFR calc Af Amer: 11 mL/min — ABNORMAL LOW (ref >60)
GFR calc non Af Amer: 10 mL/min — ABNORMAL LOW (ref >60)
Glucose, Bld: 65 mg/dL — ABNORMAL LOW (ref 70–99)
Potassium: 5.2 mmol/L — ABNORMAL HIGH (ref 3.5–5.1)
Sodium: 139 mmol/L (ref 135–145)

## 2018-10-27 LAB — MAGNESIUM: Magnesium: 2 mg/dL (ref 1.7–2.4)

## 2018-10-27 MED ORDER — PATIROMER SORBITEX CALCIUM 8.4 G PO PACK
8.4000 g | PACK | Freq: Once | ORAL | Status: AC
  Administered 2018-10-27: 8.4 g via ORAL
  Filled 2018-10-27: qty 1

## 2018-10-27 NOTE — Evaluation (Signed)
Occupational Therapy Evaluation Patient Details Name: Nyshawn Gowdy. MRN: [id number] DOB: 08-03-1950 Today's Date: 10/27/2018    History of Present Illness Neko Boyajian  is a 68 y.o. male with a known history of peripheral arterial disease, chronic right lower extremity ulcer, hypertension, end-stage renal disease on hemodialysis presents to the emergency room due to chronic right lower extremity wound and swelling of right upper extremity. He is s/p AKA 6/18.   Clinical Impression   Pt admitted with above diagnoses, pain and cognition limiting ability to engage in BADL at desired level of ind. Pt is s/p AKA 6/18, increased post surgical pain this date. Pt limited in participation and mobility due to pain. Unsure of cognitive baseline but pt appears to have deficits at time of eval. He at times becomes irritable stating therapist is "too energized" and "not understanding of pain". He is an overall poor historian and majority of home info taken from recent admission chart review. Pt oriented to self and that he is in [place], but unable to specify location despite cues. He is fixated on finding his wheelchair. Attempted to sit EOB with multiple attempts but pt unable to tolerate mobility and pain past moving pillow out from residual limb. Pt ordered meal during session, needing frequent repeats from staff to follow along in conversation. At this time recommend SNF level of care at d/c. Will continue to follow acutely per POC listed below.     Follow Up Recommendations  SNF;Supervision/Assistance - 24 hour    Equipment Recommendations  Other (comment)(defer to next venue)    Recommendations for Other Services       Precautions / Restrictions Precautions Precautions: Fall Precaution Comments: AKA Restrictions Weight Bearing Restrictions: No      Mobility Bed Mobility               General bed mobility comments: attempted to sit EOB with max VC's for sequencing. Pt limited  by pain despite multiple attempts and efforts to move toward EOB  Transfers                      Balance                                           ADL either performed or assessed with clinical judgement   ADL Overall ADL's : Needs assistance/impaired Eating/Feeding: Set up;Bed level   Grooming: Minimal assistance;Bed level   Upper Body Bathing: Total assistance;Bed level   Lower Body Bathing: Total assistance;Bed level   Upper Body Dressing : Total assistance;Bed level   Lower Body Dressing: Total assistance;Bed level   Toilet Transfer: Total assistance;BSC;RW;Grab bars Toilet Transfer Details (indicate cue type and reason): using bed pan currently Toileting- Clothing Manipulation and Hygiene: Total assistance;Bed level     Tub/Shower Transfer Details (indicate cue type and reason): not applicable per baseline, but anticiapte total A Functional mobility during ADLs: Total assistance General ADL Comments: pt currently a total A with all dynamic BADL, limited by pain     Vision Patient Visual Report: No change from baseline       Perception     Praxis      Pertinent Vitals/Pain Pain Assessment: Faces Faces Pain Scale: Hurts worst Pain Location: R residual limb Pain Descriptors / Indicators: Aching;Sore;Throbbing Pain Intervention(s): Limited activity within patient's tolerance;Monitored during session;Repositioned     Hand Dominance  Extremity/Trunk Assessment Upper Extremity Assessment Upper Extremity Assessment: Generalized weakness   Lower Extremity Assessment Lower Extremity Assessment: Defer to PT evaluation;RLE deficits/detail RLE Deficits / Details: AKA       Communication Communication Communication: No difficulties   Cognition Arousal/Alertness: Awake/alert Behavior During Therapy: WFL for tasks assessed/performed Overall Cognitive Status: No family/caregiver present to determine baseline cognitive  functioning Area of Impairment: Orientation;Memory;Following commands;Safety/judgement;Problem solving                 Orientation Level: Disoriented to;Place;Situation(stated [place], but unable to recall he is in hospital)   Memory: Decreased recall of precautions;Decreased short-term memory Following Commands: Follows one step commands with increased time Safety/Judgement: Decreased awareness of deficits;Decreased awareness of safety   Problem Solving: Slow processing;Decreased initiation;Difficulty sequencing;Requires verbal cues;Requires tactile cues General Comments: pt very slow to respond, stating he "just needs his wheelchair" to feel better. Appears disoriented to time and situation and states "you see my leg is gone"   General Comments       Exercises     Shoulder Instructions      Home Living Family/patient expects to be discharged to:: Private residence Living Arrangements: Alone Available Help at Discharge: Friend(s);Available PRN/intermittently Type of Home: Apartment Home Access: Elevator     Home Layout: One level     Bathroom Shower/Tub: Teacher, early years/pre: Standard     Home Equipment: Grab bars - tub/shower;Wheelchair - [employer] - 4 wheels;Cane - single point;Grab bars - toilet   Additional Comments: home info taken from most recent admission, pt poor historian and stating he lived in a house that had "stairs sometimes"      Prior Functioning/Environment Level of Independence: Needs assistance  Gait / Transfers Assistance Needed: Reports using w/c at baseline ADL's / Homemaking Assistance Needed: unable to provide history, but per chart review he was sponge bathing and cooking was more difficult and needed outside assist            OT Problem List: Decreased strength;Decreased knowledge of use of DME or AE;Decreased knowledge of precautions;Decreased activity tolerance;Decreased cognition;Impaired UE functional  use;Impaired balance (sitting and/or standing);Decreased safety awareness;Pain      OT Treatment/Interventions: Self-care/ADL training;Therapeutic exercise;Patient/family education;Balance training;Energy conservation;Therapeutic activities;DME and/or AE instruction;Cognitive remediation/compensation    OT Goals(Current goals can be found in the care plan section) Acute Rehab OT Goals Patient Stated Goal: to get my wheelchair OT Goal Formulation: With patient Time For Goal Achievement: 11/10/18 Potential to Achieve Goals: Good  OT Frequency: Min 2X/week   Barriers to D/C:            Co-evaluation PT/OT/SLP Co-Evaluation/Treatment: Yes Reason for Co-Treatment: Complexity of the patient's impairments (multi-system involvement);For patient/therapist safety;To address functional/ADL transfers PT goals addressed during session: Mobility/safety with mobility OT goals addressed during session: ADL's and self-care      AM-PAC OT "6 Clicks" Daily Activity     Outcome Measure Help from another person eating meals?: None Help from another person taking care of personal grooming?: A Little Help from another person toileting, which includes using toliet, bedpan, or urinal?: Total Help from another person bathing (including washing, rinsing, drying)?: Total Help from another person to put on and taking off regular upper body clothing?: Total Help from another person to put on and taking off regular lower body clothing?: Total 6 Click Score: 11   End of Session    Activity Tolerance: Patient limited by pain Patient left: in bed;with call bell/phone within reach;with bed alarm set  OT Visit Diagnosis: Other abnormalities of gait and mobility (R26.89);Muscle weakness (generalized) (M62.81);Unsteadiness on feet (R26.81);Pain Pain - Right/Left: Right Pain - part of body: (residual limb)                Time: 4944-9675 OT Time Calculation (min): 27 min Charges:  OT General Charges $OT Visit:  1 Visit OT Evaluation $OT Eval Moderate Complexity: 1 Mod  Zenovia Jarred, MSOT, OTR/L Behavioral Health OT/ Acute Relief OT  Zenovia Jarred 10/27/2018, 4:52 PM

## 2018-10-27 NOTE — Evaluation (Signed)
Physical Therapy Evaluation Patient Details Name: Johnny [name]. MRN: [id number] DOB: 07/07/1950 Today's Date: 10/27/2018   History of Present Illness  Pt is a 68 y.o. male with a known history of peripheral arterial disease, chronic right lower extremity ulcer, hypertension, end-stage renal disease on hemodialysis presents to the emergency room due to chronic right lower extremity wound and swelling of right upper extremity. 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  Clinical Impression  Prior to hospital admission, pt reports using a w/c but pt appearing confused during session (not sure of cognitive baseline).  Pt stating "wait a minute" a lot and then stating that therapist was "too energized" and did not understand pain.  Unable to move towards edge of bed even with giving pt a lot of extra time with attempts at moving towards edge of bed (and 2 assist present to assist); only able to remove pillow from under R residual limb and once unable to works towards edge of bed took a lot of time to eventually place pillow back under R residual limb to elevate (all with 2 assist).  Pt tending to fixate on finding his w/c.  Pt would benefit from skilled PT to address noted impairments and functional limitations (see below for any additional details).  Upon hospital discharge, pt would benefit from STR pending therapy participation levels.    Follow Up Recommendations SNF    Equipment Recommendations  3in1 (PT);Wheelchair (measurements PT);Wheelchair cushion (measurements PT);Hospital bed    Recommendations for Other Services OT consult     Precautions / Restrictions Precautions Precautions: Fall Precaution Comments: R Restrictions Weight Bearing Restrictions: Yes RLE Weight Bearing: Non weight bearing Other Position/Activity Restrictions: R LE AKA      Mobility  Bed Mobility               General bed mobility comments: With max cueing for sequencing and significant extra  time (for sitting edge of bed) unable to perform with 2 assist: pt stating "wait a minute" multiple times and overall limited by R LE pain and unable to move LE's towards edge of bed  Transfers                    Ambulation/Gait                Stairs            Wheelchair Mobility    Modified Rankin (Stroke Patients Only)       Balance                                             Pertinent Vitals/Pain Pain Assessment: Faces Faces Pain Scale: Hurts worst Pain Location: R residual limb Pain Descriptors / Indicators: Aching;Sore;Throbbing Pain Intervention(s): Limited activity within patient's tolerance;Monitored during session;Repositioned    Home Living Family/patient expects to be discharged to:: Private residence Living Arrangements: Alone Available Help at Discharge: Friend(s);Available PRN/intermittently Type of Home: Apartment Home Access: Elevator     Home Layout: One level Home Equipment: Grab bars - tub/shower;Wheelchair - [employer] - 4 wheels;Cane - single point;Grab bars - toilet Additional Comments: Home information taken from most recent admission (pt appearing to be a poor historian and that he lived in a house that had "stairs sometimes"    Prior Function Level of Independence: Needs assistance   Gait /  Transfers Assistance Needed: Pt reports using w/c at baseline  ADL's / Homemaking Assistance Needed: Pt unable to provide history, but per chart review he was sponge bathing and cooking was more difficult and needed outside assist        Hand Dominance        Extremity/Trunk Assessment   Upper Extremity Assessment Upper Extremity Assessment: Defer to OT evaluation    Lower Extremity Assessment Lower Extremity Assessment: LLE deficits/detail RLE Deficits / Details: R LE AKA (able to perform SLR with assist but limited d/t R residual limb pain) LLE Deficits / Details: able to perform L LE hip/knee flexion  AROM in bed but limited range on own (d/t causing R LE pain)       Communication   Communication: No difficulties  Cognition Arousal/Alertness: Awake/alert Behavior During Therapy: WFL for tasks assessed/performed Overall Cognitive Status: No family/caregiver present to determine baseline cognitive functioning Area of Impairment: Orientation;Memory;Following commands;Safety/judgement;Problem solving                 Orientation Level: Disoriented to;Place;Situation   Memory: Decreased recall of precautions;Decreased short-term memory Following Commands: Follows one step commands with increased time Safety/Judgement: Decreased awareness of deficits;Decreased awareness of safety   Problem Solving: Slow processing;Decreased initiation;Difficulty sequencing;Requires verbal cues;Requires tactile cues General Comments: Pt appearing very slow to respond (stating he "just needs his wheelchair" to feel better). Appears disoriented to time and situation and states "you see my leg is gone".      General Comments General comments (skin integrity, edema, etc.): R residual limb dressing in place    Exercises     Assessment/Plan    PT Assessment Patient needs continued PT services  PT Problem List Decreased strength;Decreased range of motion;Decreased activity tolerance;Decreased balance;Decreased mobility;Decreased cognition;Decreased knowledge of use of DME;Decreased safety awareness;Decreased knowledge of precautions;Pain;Decreased skin integrity       PT Treatment Interventions DME instruction;Gait training;Functional mobility training;Therapeutic activities;Therapeutic exercise;Balance training;Patient/family education;Wheelchair mobility training    PT Goals (Current goals can be found in the Care Plan section)  Acute Rehab PT Goals Patient Stated Goal: to get my wheelchair PT Goal Formulation: With patient Time For Goal Achievement: 11/10/18 Potential to Achieve Goals: Fair     Frequency Min 2X/week   Barriers to discharge Decreased caregiver support      Co-evaluation PT/OT/SLP Co-Evaluation/Treatment: Yes Reason for Co-Treatment: For patient/therapist safety;To address functional/ADL transfers PT goals addressed during session: Mobility/safety with mobility OT goals addressed during session: ADL's and self-care       AM-PAC PT "6 Clicks" Mobility  Outcome Measure Help needed turning from your back to your side while in a flat bed without using bedrails?: A Lot Help needed moving from lying on your back to sitting on the side of a flat bed without using bedrails?: Total Help needed moving to and from a bed to a chair (including a wheelchair)?: Total Help needed standing up from a chair using your arms (e.g., wheelchair or bedside chair)?: Total Help needed to walk in hospital room?: Total Help needed climbing 3-5 steps with a railing? : Total 6 Click Score: 7    End of Session   Activity Tolerance: Patient limited by pain Patient left: in bed;with call bell/phone within reach;with bed alarm set(R residual limb elevated on pillow) Nurse Communication: Precautions PT Visit Diagnosis: Other abnormalities of gait and mobility (R26.89);Muscle weakness (generalized) (M62.81);Difficulty in walking, not elsewhere classified (R26.2);Pain Pain - Right/Left: Right Pain - part of body: Leg    Time:  4129-0475 PT Time Calculation (min) (ACUTE ONLY): 25 min   Charges:   PT Evaluation $PT Eval Low Complexity: 1 Low          [place], PT 10/27/18, 6:15 PM [phone]

## 2018-10-27 NOTE — Progress Notes (Signed)
2 Days Post-Op   Subjective/Chief Complaint: Doing Ok. Notes mild pain in right hand and right stump. Overall improved from yesterday.   Objective: Vital signs in last 24 hours: Temp:  [97.8 F (36.6 C)-98.3 F (36.8 C)] 98.2 F (36.8 C) (06/20 0423) Pulse Rate:  [73-128] 89 (06/20 0842) Resp:  [11-28] 17 (06/20 0423) BP: (98-193)/(30-78) 178/78 (06/20 0842) SpO2:  [95 %-100 %] 100 % (06/20 0423) Weight:  [69 kg-70 kg] 70 kg (06/20 0500) Last BM Date: 10/22/18  Intake/Output from previous day: 06/19 0701 - 06/20 0700 In: -  Out: 200  Intake/Output this shift: Total I/O In: 240 [P.O.:240] Out: -   General appearance: alert and no distress Cardio: regular rate and rhythm Extremities: Right Upper Extremity: Slightly improved swelling. Upper arm / forearm soft. Extremity warm distally, cooler at the hand. Motor / sensory intact.  Right stump- warm, soft, dressing- C/D/I  Lab Results:  Recent Labs    10/25/18 0950 10/26/18 1010  WBC 24.5* 21.3*  HGB 10.8* 8.6*  HCT 36.4* 28.8*  PLT 384 403*   BMET Recent Labs    10/26/18 1010 10/27/18 0835  NA 140 139  K 5.7* 5.2*  CL 99 96*  CO2 25 29  GLUCOSE 92 65*  BUN 61* 35*  CREATININE 7.95* 5.52*  CALCIUM 8.1* 8.1*   PT/INR Recent Labs    10/25/18 0950  LABPROT 13.9  INR 1.1   ABG No results for input(s): PHART, HCO3 in the last 72 hours.  Invalid input(s): PCO2, PO2  Studies/Results: No results found.  Anti-infectives: Anti-infectives (From admission, onward)   Start     Dose/Rate Route Frequency Ordered Stop   10/25/18 1339  ceFAZolin (ANCEF) 1-4 GM/50ML-% IVPB    Note to Pharmacy: Milinda Cave   : cabinet override      10/25/18 1339 10/25/18 1414   10/25/18 0700  ceFAZolin (ANCEF) IVPB 1 g/50 mL premix  Status:  Discontinued    Note to Pharmacy: Please send with patient to OR   1 g 100 mL/hr over 30 Minutes Intravenous  Once 10/24/18 1348 10/24/18 1811   10/23/18 2145  ceFAZolin (ANCEF) IVPB 1  g/50 mL premix     1 g 100 mL/hr over 30 Minutes Intravenous  Once 10/23/18 2133 10/24/18 0756   10/22/18 1600  vancomycin (VANCOCIN) 1,500 mg in sodium chloride 0.9 % 500 mL IVPB  Status:  Discontinued     1,500 mg 250 mL/hr over 120 Minutes Intravenous  Once 10/22/18 1546 10/22/18 1739   10/22/18 1600  ceFEPIme (MAXIPIME) 2 g in sodium chloride 0.9 % 100 mL IVPB  Status:  Discontinued     2 g 200 mL/hr over 30 Minutes Intravenous  Once 10/22/18 1547 10/22/18 1739      Assessment/Plan: s/p Procedure(s): AMPUTATION ABOVE KNEE (Right) .  S/P stenting- Innominate Vein Continue supportive care. Will change RIGHT AKA dressing tomorrow. Monitor right Upper extremity.    LOS: 5 days    Johnny [name] A 10/27/2018

## 2018-10-27 NOTE — Progress Notes (Signed)
[facility]  ROUNDING NOTE   Subjective:   Hemodialysis treatment yesterday. Tolerated treatment well. No ultrafiltration.   Objective:  Vital signs in last 24 hours:  Temp:  [97.8 F (36.6 C)-98.6 F (37 C)] 98.6 F (37 C) (06/20 1341) Pulse Rate:  [75-128] 81 (06/20 1341) Resp:  [13-28] 15 (06/20 1341) BP: (98-193)/(30-82) 177/82 (06/20 1341) SpO2:  [95 %-100 %] 100 % (06/20 1341) Weight:  [69 kg-70 kg] 70 kg (06/20 0500)  Weight change:  Filed Weights   10/26/18 1430 10/26/18 1715 10/27/18 0500  Weight: 69.3 kg 69 kg 70 kg    Intake/Output: I/O last 3 completed shifts: In: -  Out: 200 [Other:200]   Intake/Output this shift:  Total I/O In: 240 [P.O.:240] Out: -   Physical Exam: General: NAD,   Head: Normocephalic, atraumatic. Moist oral mucosal membranes  Eyes: Anicteric, PERRL  Neck: Supple, trachea midline  Lungs:  Clear to auscultation  Heart: Regular rate and rhythm  Abdomen:  Soft, nontender,   Extremities:  right above the knee amputation wound dressing clean and dry  Neurologic: Nonfocal, moving all four extremities  Skin: No lesions  Access: Right arm AVG    Basic Metabolic Panel: Recent Labs  Lab 10/22/18 1948 10/23/18 0814 10/25/18 0950 10/26/18 1010 10/27/18 0835  NA 139 138 139 140 139  K 4.9 5.2* 5.6* 5.7* 5.2*  CL 99 100 97* 99 96*  CO2 24 25 27 25 29   GLUCOSE 85 76 130* 92 65*  BUN 40* 46* 42* 61* 35*  CREATININE 6.62* 7.72* 6.46* 7.95* 5.52*  CALCIUM 7.9* 7.9* 8.4* 8.1* 8.1*  MG  --   --  2.0 2.0 2.0  PHOS  --   --   --  7.4*  --     Liver Function Tests: Recent Labs  Lab 10/22/18 1948  AST 14*  ALT 7  ALKPHOS 73  BILITOT 0.5  PROT 6.8  ALBUMIN 2.7*   No results for input(s): LIPASE, AMYLASE in the last 168 hours. No results for input(s): AMMONIA in the last 168 hours.  CBC: Recent Labs  Lab 10/22/18 1948 10/23/18 0814 10/25/18 0950 10/26/18 1010  WBC 17.2* 13.8* 24.5* 21.3*  NEUTROABS 13.8*  --    --   --   HGB 8.9* 8.1* 10.8* 8.6*  HCT 29.8* 27.5* 36.4* 28.8*  MCV 90.0 91.1 89.7 89.4  PLT 249 288 384 403*    Cardiac Enzymes: No results for input(s): CKTOTAL, CKMB, CKMBINDEX, TROPONINI in the last 168 hours.  BNP: Invalid input(s): POCBNP  CBG: No results for input(s): GLUCAP in the last 168 hours.  Microbiology: Results for orders placed or performed during the hospital encounter of 10/22/18  MRSA PCR Screening     Status: None   Collection Time: 10/22/18  2:16 PM   Specimen: Nasal Mucosa; Nasopharyngeal  Result Value Ref Range Status   MRSA by PCR NEGATIVE NEGATIVE Final    Comment:        The GeneXpert MRSA Assay (FDA approved for NASAL specimens only), is one component of a comprehensive MRSA colonization surveillance program. It is not intended to diagnose MRSA infection nor to guide or monitor treatment for MRSA infections. Performed at [facility], [address] [place], [place] [address]   Novel Coronavirus,NAA,(SEND-OUT TO REF LAB - TAT 24-48 hrs); Hosp Order     Status: None   Collection Time: 10/22/18  3:52 PM   Specimen: Nasopharyngeal Swab; Respiratory  Result Value Ref Range Status  SARS-CoV-2, NAA NOT DETECTED NOT DETECTED Final    Comment: (NOTE) This test was developed and its performance characteristics determined by [employer]. This test has not been FDA cleared or approved. This test has been authorized by FDA under an Emergency Use Authorization (EUA). This test is only authorized for the duration of time the declaration that circumstances exist justifying the authorization of the emergency use of in vitro diagnostic tests for detection of SARS-CoV-2 virus and/or diagnosis of COVID-19 infection under section 564(b)(1) of the Act, 21 U.S.C. 884ZYS-0(Y)(3), unless the authorization is terminated or revoked sooner. When diagnostic testing is negative, the possibility of a false negative result should be considered  in the context of a patient's recent exposures and the presence of clinical signs and symptoms consistent with COVID-19. An individual without symptoms of COVID-19 and who is not shedding SARS-CoV-2 virus would expect to have a negative (not detected) result in this assay. Performed  At: [facility] [address] [place], [place] [id number] Rush Farmer MD [id number]    [place]  Final    Comment: Performed at [facility], [address] [place], [place] [address]  Blood Culture (routine x 2)     Status: None (Preliminary result)   Collection Time: 10/23/18  8:15 AM   Specimen: BLOOD  Result Value Ref Range Status   Specimen Description BLOOD BLOOD LEFT HAND  Final   Special Requests   Final    BOTTLES DRAWN AEROBIC AND ANAEROBIC Blood Culture adequate volume   Culture   Final    NO GROWTH 4 DAYS Performed at [facility], [address], [place], [place] [address]    Report Status PENDING  Incomplete  Blood Culture (routine x 2)     Status: None (Preliminary result)   Collection Time: 10/23/18  8:32 AM   Specimen: BLOOD  Result Value Ref Range Status   Specimen Description BLOOD BLOOD RIGHT WRIST  Final   Special Requests   Final    BOTTLES DRAWN AEROBIC AND ANAEROBIC Blood Culture adequate volume   Culture   Final    NO GROWTH 4 DAYS Performed at [facility], [address], [place], [place] [address]    Report Status PENDING  Incomplete    Coagulation Studies: Recent Labs    10/25/18 0950  LABPROT 13.9  INR 1.1    Urinalysis: No results for input(s): COLORURINE, LABSPEC, PHURINE, GLUCOSEU, HGBUR, BILIRUBINUR, KETONESUR, PROTEINUR, UROBILINOGEN, NITRITE, LEUKOCYTESUR in the last 72 hours.  Invalid input(s): APPERANCEUR    Imaging: No results found.   Medications:    . allopurinol  100 mg Oral Daily  . aspirin EC  81 mg Oral Daily  . atorvastatin  10 mg Oral Daily  . clopidogrel  75  mg Oral Daily  . [START ON 10/29/2018] epoetin (EPOGEN/PROCRIT) injection  10,000 Units Intravenous Q M,W,F-HD  . feeding supplement (NEPRO CARB STEADY)  237 mL Oral TID BM  . gabapentin  100 mg Oral BID  . heparin injection (subcutaneous)  5,000 Units Subcutaneous Q8H  . losartan  100 mg Oral Daily  . metoprolol tartrate  25 mg Oral BID  . multivitamin  1 tablet Oral QHS  . multivitamin-lutein  1 capsule Oral Daily  . sevelamer carbonate  2,400 mg Oral TID WC  . vitamin C  250 mg Oral BID   acetaminophen **OR** acetaminophen, albuterol, clonazePAM, hydrALAZINE, HYDROcodone-acetaminophen, morphine injection, ondansetron **OR** ondansetron (ZOFRAN) IV, polyethylene glycol  Assessment/ Plan:  Mr.  Johnny [name]. is a 68 y.o. black male with end stage renal disease on hemodialysis, hypertension, peripheral vascular disease, congestive heart failure  [facility] [address] MWF [phone] Right AVG   1. End Stage Renal Disease: hemodialysis treatment yesterday Complication with dialysis access. Concerning for central stenosis causing edema and steal syndrome causing pain and numbness. Status post angiogram 6/17 Dr. Lucky Cowboy with balloon angioplasty  Appreciate vascular input  - MWF schedule.   2. Hypertension:  - Continue metoprolol and losartan.   3. Anemia of chronic kidney disease: hemoglobin 8.6 - EPO with HD treatment  4. Secondary Hyperparathyroidism: labs from 6/8 PTH elevated at 750. Phosphorus 3.8, calcium 9.4 - Continue sevelamer with meals.   5. Peripheral vascular disease with cellulitis. 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 Lucky Cowboy   LOS: [place] 6/20/20202:41 PM

## 2018-10-27 NOTE — Progress Notes (Signed)
[facility] [place] NAME: Johnny [name]    MR#:  [id number]  DATE OF BIRTH:  [birth year]  SUBJECTIVE:   Patient states he is doing okay this morning.  He endorses continued pain at his surgical site.  He also endorses generalized weakness.  No numbness or tingling.   REVIEW OF SYSTEMS:  Review of Systems  Constitutional: Negative for chills and fever.  HENT: Negative for hearing loss and tinnitus.   Eyes: Negative for blurred vision and double vision.  Respiratory: Negative for cough and shortness of breath.   Cardiovascular: Negative for chest pain and palpitations.  Gastrointestinal: Negative for heartburn, nausea and vomiting.  Genitourinary: Negative for dysuria and urgency.  Musculoskeletal: Positive for joint pain. Negative for myalgias and neck pain.  Skin: Negative for itching and rash.  Neurological: Negative for dizziness and headaches.  Psychiatric/Behavioral: Negative for depression and hallucinations.    DRUG ALLERGIES:   Allergies  Allergen Reactions  . Shellfish Allergy Anaphylaxis   VITALS:  Blood pressure (!) 177/82, pulse 81, temperature 98.6 F (37 C), temperature source Oral, resp. rate 15, height 5\' 8"  (1.727 m), weight 70 kg, SpO2 100 %. PHYSICAL EXAMINATION:  Physical Exam  GENERAL:  68 y.o.-year-old patient lying in the bed with no acute distress.  EYES: Pupils equal, round, reactive to light and accommodation. No scleral icterus. Extraocular muscles intact.  HEENT: Head atraumatic, normocephalic. Oropharynx and nasopharynx clear. No oropharyngeal erythema, moist oral mucosa  NECK:  Supple, no jugular venous distention. No thyroid enlargement, no tenderness.  LUNGS: Normal breath sounds bilaterally, no wheezing, rales, rhonchi. No use of accessory muscles of respiration.  CARDIOVASCULAR: S1, S2 normal. No murmurs, rubs, or gallops.  ABDOMEN: Soft, nontender, nondistended. Bowel sounds present. No  organomegaly or mass.  EXTREMITIES: Patient status post right AKA with dry dressing in place.  No edema on left.   NEUROLOGIC: Cranial nerves II through XII are intact. No focal Motor or sensory deficits. PSYCHIATRIC: The patient is alert and oriented x 3. Good affect.  SKIN:  Right stump dressing as above, no other rashes or lesions appreciated.  Skin on left lower extremity is dry   LABORATORY PANEL:  Male CBC Recent Labs  Lab 10/26/18 1010  WBC 21.3*  HGB 8.6*  HCT 28.8*  PLT 403*   ------------------------------------------------------------------------------------------------------------------ Chemistries  Recent Labs  Lab 10/22/18 1948  10/27/18 0835  NA 139   < > 139  K 4.9   < > 5.2*  CL 99   < > 96*  CO2 24   < > 29  GLUCOSE 85   < > 65*  BUN 40*   < > 35*  CREATININE 6.62*   < > 5.52*  CALCIUM 7.9*   < > 8.1*  MG  --    < > 2.0  AST 14*  --   --   ALT 7  --   --   ALKPHOS 73  --   --   BILITOT 0.5  --   --    < > = values in this interval not displayed.   RADIOLOGY:  No results found. ASSESSMENT AND PLAN:   Right lower extremity chronic wound with PAD s/p right AKI 10/25/18 -Vascular surgery following-we will plan to change dressing tomorrow -PT recommending SNF-awaiting placement  Right upper extremity swelling with AV fistula present -Shuntogram scheduled done on 10/24/2018 by vascular surgery  Uncontrolled hypertension -Continue losartan and metoprolol -IV hydralazine PRN  Hyperkalemia- likely due to ESRD -Will give [place] x 1 today -Recheck potassium in the morning  ESRD on HD MWF -Nephrology following  Chronic diastolic CHF-stable, no signs of acute exacerbation -Volume management per nephrology  Anemia in chronic kidney disease- globin is low, but at baseline. -Management per nephrology  DVT prophylaxis with heparin  All the records are reviewed and case discussed with Care Management/Social Worker. Management plans discussed  with the patient, family and they are in agreement. Called patient's contact listed in the chart Ms. [name]; updated on treatment plans.  All questions answered.  CODE STATUS: Full Code  TOTAL TIME TAKING CARE OF THIS PATIENT: 35 minutes.   More than 50% of the time was spent in counseling/coordination of care: YES  POSSIBLE D/C IN 2-3 DAYS, DEPENDING ON CLINICAL CONDITION.   Berna Spare Adileny Delon M.D on 10/27/2018 at 2:37 PM  Between 7am to 6pm - Pager [phone]  After 6pm go to [email or web address] - Proofreader  [facility]  Office  [phone]  CC: Primary care physician; [name], MD  Note: This dictation was prepared with Dragon dictation along with smaller phrase technology. Any transcriptional errors that result from this process are unintentional.

## 2018-10-28 LAB — RENAL FUNCTION PANEL
Albumin: 2.4 g/dL — ABNORMAL LOW (ref 3.5–5.0)
Anion gap: 14 (ref 5–15)
BUN: 48 mg/dL — ABNORMAL HIGH (ref 8–23)
CO2: 29 mmol/L (ref 22–32)
Calcium: 8.1 mg/dL — ABNORMAL LOW (ref 8.9–10.3)
Chloride: 98 mmol/L (ref 98–111)
Creatinine, Ser: 7.3 mg/dL — ABNORMAL HIGH (ref 0.61–1.24)
GFR calc Af Amer: 8 mL/min — ABNORMAL LOW (ref >60)
GFR calc non Af Amer: 7 mL/min — ABNORMAL LOW (ref >60)
Glucose, Bld: 88 mg/dL (ref 70–99)
Phosphorus: 5.3 mg/dL — ABNORMAL HIGH (ref 2.5–4.6)
Potassium: 4.6 mmol/L (ref 3.5–5.1)
Sodium: 141 mmol/L (ref 135–145)

## 2018-10-28 LAB — CBC
HCT: 28.4 % — ABNORMAL LOW (ref 39.0–52.0)
Hemoglobin: 8.2 g/dL — ABNORMAL LOW (ref 13.0–17.0)
MCH: 26.4 pg (ref 26.0–34.0)
MCHC: 28.9 g/dL — ABNORMAL LOW (ref 30.0–36.0)
MCV: 91.3 fL (ref 80.0–100.0)
Platelets: 345 10*3/uL (ref 150–400)
RBC: 3.11 MIL/uL — ABNORMAL LOW (ref 4.22–5.81)
RDW: 15.7 % — ABNORMAL HIGH (ref 11.5–15.5)
WBC: 13.5 10*3/uL — ABNORMAL HIGH (ref 4.0–10.5)
nRBC: 0 % (ref 0.0–0.2)

## 2018-10-28 LAB — CULTURE, BLOOD (ROUTINE X 2)
Culture: NO GROWTH
Culture: NO GROWTH
Special Requests: ADEQUATE
Special Requests: ADEQUATE

## 2018-10-28 MED ORDER — PATIROMER SORBITEX CALCIUM 8.4 G PO PACK
8.4000 g | PACK | Freq: Once | ORAL | Status: AC
  Administered 2018-10-28: 8.4 g via ORAL
  Filled 2018-10-28: qty 1

## 2018-10-28 MED ORDER — AMLODIPINE BESYLATE 5 MG PO TABS
5.0000 mg | ORAL_TABLET | Freq: Every day | ORAL | Status: DC
  Administered 2018-10-28 – 2018-10-30 (×2): 5 mg via ORAL
  Filled 2018-10-28 (×2): qty 1

## 2018-10-28 MED ORDER — HYDROMORPHONE HCL 1 MG/ML IJ SOLN
0.5000 mg | INTRAMUSCULAR | Status: DC | PRN
  Administered 2018-10-28: 0.5 mg via INTRAVENOUS
  Administered 2018-10-28 – 2018-10-30 (×6): 1 mg via INTRAVENOUS
  Filled 2018-10-28 (×7): qty 1

## 2018-10-28 MED ORDER — POLYETHYLENE GLYCOL 3350 17 G PO PACK
17.0000 g | PACK | Freq: Every day | ORAL | Status: DC
  Administered 2018-10-28 – 2018-10-30 (×3): 17 g via ORAL
  Filled 2018-10-28 (×3): qty 1

## 2018-10-28 NOTE — Progress Notes (Signed)
[facility]  ROUNDING NOTE   Subjective:   Patient states his pain is well controlled.    Objective:  Vital signs in last 24 hours:  Temp:  [97.9 F (36.6 C)-98.6 F (37 C)] 98 F (36.7 C) (06/21 1300) Pulse Rate:  [77-101] 101 (06/21 1300) Resp:  [15-16] 16 (06/21 1300) BP: (169-197)/(75-84) 169/84 (06/21 1300) SpO2:  [88 %-100 %] 98 % (06/21 1300)  Weight change:  Filed Weights   10/26/18 1430 10/26/18 1715 10/27/18 0500  Weight: 69.3 kg 69 kg 70 kg    Intake/Output: I/O last 3 completed shifts: In: 240 [P.O.:240] Out: 0    Intake/Output this shift:  No intake/output data recorded.  Physical Exam: General: NAD,   Head: Normocephalic, atraumatic. Moist oral mucosal membranes  Eyes: Anicteric, PERRL  Neck: Supple, trachea midline  Lungs:  Clear to auscultation  Heart: Regular rate and rhythm  Abdomen:  Soft, nontender,   Extremities:  right above the knee amputation wound dressing clean and dry  Neurologic: Nonfocal, moving all four extremities  Skin: No lesions  Access: Right arm AVG    Basic Metabolic Panel: Recent Labs  Lab 10/23/18 0814 10/25/18 0950 10/26/18 1010 10/27/18 0835 10/28/18 1101  NA 138 139 140 139 141  K 5.2* 5.6* 5.7* 5.2* 4.6  CL 100 97* 99 96* 98  CO2 25 27 25 29 29   GLUCOSE 76 130* 92 65* 88  BUN 46* 42* 61* 35* 48*  CREATININE 7.72* 6.46* 7.95* 5.52* 7.30*  CALCIUM 7.9* 8.4* 8.1* 8.1* 8.1*  MG  --  2.0 2.0 2.0  --   PHOS  --   --  7.4*  --  5.3*    Liver Function Tests: Recent Labs  Lab 10/22/18 1948 10/28/18 1101  AST 14*  --   ALT 7  --   ALKPHOS 73  --   BILITOT 0.5  --   PROT 6.8  --   ALBUMIN 2.7* 2.4*   No results for input(s): LIPASE, AMYLASE in the last 168 hours. No results for input(s): AMMONIA in the last 168 hours.  CBC: Recent Labs  Lab 10/22/18 1948 10/23/18 0814 10/25/18 0950 10/26/18 1010 10/28/18 1101  WBC 17.2* 13.8* 24.5* 21.3* 13.5*  NEUTROABS 13.8*  --   --   --   --    HGB 8.9* 8.1* 10.8* 8.6* 8.2*  HCT 29.8* 27.5* 36.4* 28.8* 28.4*  MCV 90.0 91.1 89.7 89.4 91.3  PLT 249 288 384 403* 345    Cardiac Enzymes: No results for input(s): CKTOTAL, CKMB, CKMBINDEX, TROPONINI in the last 168 hours.  BNP: Invalid input(s): POCBNP  CBG: No results for input(s): GLUCAP in the last 168 hours.  Microbiology: Results for orders placed or performed during the hospital encounter of 10/22/18  MRSA PCR Screening     Status: None   Collection Time: 10/22/18  2:16 PM   Specimen: Nasal Mucosa; Nasopharyngeal  Result Value Ref Range Status   MRSA by PCR NEGATIVE NEGATIVE Final    Comment:        The GeneXpert MRSA Assay (FDA approved for NASAL specimens only), is one component of a comprehensive MRSA colonization surveillance program. It is not intended to diagnose MRSA infection nor to guide or monitor treatment for MRSA infections. Performed at [facility], [address] [place], [place] [address]   Novel Coronavirus,NAA,(SEND-OUT TO REF LAB - TAT 24-48 hrs); Hosp Order     Status: None   Collection Time: 10/22/18  3:52  PM   Specimen: Nasopharyngeal Swab; Respiratory  Result Value Ref Range Status   SARS-CoV-2, NAA NOT DETECTED NOT DETECTED Final    Comment: (NOTE) This test was developed and its performance characteristics determined by [employer]. This test has not been FDA cleared or approved. This test has been authorized by FDA under an Emergency Use Authorization (EUA). This test is only authorized for the duration of time the declaration that circumstances exist justifying the authorization of the emergency use of in vitro diagnostic tests for detection of SARS-CoV-2 virus and/or diagnosis of COVID-19 infection under section 564(b)(1) of the Act, 21 U.S.C. 342AJG-8(T)(1), unless the authorization is terminated or revoked sooner. When diagnostic testing is negative, the possibility of a false negative result should be  considered in the context of a patient's recent exposures and the presence of clinical signs and symptoms consistent with COVID-19. An individual without symptoms of COVID-19 and who is not shedding SARS-CoV-2 virus would expect to have a negative (not detected) result in this assay. Performed  At: [facility] [address] [place], [place] [id number] Rush Farmer MD HR:[id number]    [place]  Final    Comment: Performed at [facility], [address] [place], [place] [address]  Blood Culture (routine x 2)     Status: None   Collection Time: 10/23/18  8:15 AM   Specimen: BLOOD  Result Value Ref Range Status   Specimen Description BLOOD BLOOD LEFT HAND  Final   Special Requests   Final    BOTTLES DRAWN AEROBIC AND ANAEROBIC Blood Culture adequate volume   Culture   Final    NO GROWTH 5 DAYS Performed at [facility], [address] [place], [place] [address]    Report Status 10/28/2018 FINAL  Final  Blood Culture (routine x 2)     Status: None   Collection Time: 10/23/18  8:32 AM   Specimen: BLOOD  Result Value Ref Range Status   Specimen Description BLOOD BLOOD RIGHT WRIST  Final   Special Requests   Final    BOTTLES DRAWN AEROBIC AND ANAEROBIC Blood Culture adequate volume   Culture   Final    NO GROWTH 5 DAYS Performed at [facility], [address], [place], [place] [address]    Report Status 10/28/2018 FINAL  Final    Coagulation Studies: No results for input(s): LABPROT, INR in the last 72 hours.  Urinalysis: No results for input(s): COLORURINE, LABSPEC, PHURINE, GLUCOSEU, HGBUR, BILIRUBINUR, KETONESUR, PROTEINUR, UROBILINOGEN, NITRITE, LEUKOCYTESUR in the last 72 hours.  Invalid input(s): APPERANCEUR    Imaging: No results found.   Medications:    . allopurinol  100 mg Oral Daily  . amLODipine  5 mg Oral Daily  . aspirin EC  81 mg Oral Daily  . atorvastatin  10 mg Oral Daily  .  clopidogrel  75 mg Oral Daily  . [START ON 10/29/2018] epoetin (EPOGEN/PROCRIT) injection  10,000 Units Intravenous Q M,W,F-HD  . feeding supplement (NEPRO CARB STEADY)  237 mL Oral TID BM  . gabapentin  100 mg Oral BID  . heparin injection (subcutaneous)  5,000 Units Subcutaneous Q8H  . losartan  100 mg Oral Daily  . metoprolol tartrate  25 mg Oral BID  . multivitamin  1 tablet Oral QHS  . multivitamin-lutein  1 capsule Oral Daily  . sevelamer carbonate  2,400 mg Oral TID WC  . vitamin C  250 mg Oral BID   acetaminophen **OR** acetaminophen, albuterol,  clonazePAM, hydrALAZINE, HYDROcodone-acetaminophen, HYDROmorphone (DILAUDID) injection, morphine injection, ondansetron **OR** ondansetron (ZOFRAN) IV, polyethylene glycol  Assessment/ Plan:  [name] is a 68 y.o. black male with end stage renal disease on hemodialysis, hypertension, peripheral vascular disease, congestive heart failure  [place] MWF 66kg Right AVG   1. End Stage Renal Disease:   Complication with dialysis access. Concerning for central stenosis causing edema and steal syndrome causing pain and numbness. Status post angiogram 6/17 Dr. Lucky Cowboy with balloon angioplasty  Appreciate vascular input  - MWF schedule.   2. Hypertension:  - Continue metoprolol and losartan.   3. Anemia of chronic kidney disease: hemoglobin 8.2 - EPO with HD treatment  4. Secondary Hyperparathyroidism: labs from 6/8 PTH elevated at 750. Phosphorus 3.8, calcium 9.4 - Continue sevelamer with meals.   5. Peripheral vascular disease with cellulitis. 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 Lucky Cowboy   LOS: 6 Saphire Barnhart 6/21/20201:14 PM

## 2018-10-28 NOTE — Progress Notes (Signed)
[facility] [place] NAME: Johnny [name]    MR#:  [id number]  DATE OF BIRTH:  30-Sep-1950  SUBJECTIVE:   Doing well this morning.  Still having some pain in his stump.  Denies any fevers, chills, cough, shortness of breath.  REVIEW OF SYSTEMS:  Review of Systems  Constitutional: Negative for chills and fever.  HENT: Negative for hearing loss and tinnitus.   Eyes: Negative for blurred vision and double vision.  Respiratory: Negative for cough and shortness of breath.   Cardiovascular: Negative for chest pain and palpitations.  Gastrointestinal: Negative for heartburn, nausea and vomiting.  Genitourinary: Negative for dysuria and urgency.  Musculoskeletal: Positive for joint pain. Negative for myalgias and neck pain.  Skin: Negative for itching and rash.  Neurological: Negative for dizziness and headaches.  Psychiatric/Behavioral: Negative for depression and hallucinations.    DRUG ALLERGIES:   Allergies  Allergen Reactions  . Shellfish Allergy Anaphylaxis   VITALS:  Blood pressure (!) 180/84, pulse 77, temperature 97.9 F (36.6 C), temperature source Oral, resp. rate 16, height 5\' 8"  (1.727 m), weight 70 kg, SpO2 (!) 88 %. PHYSICAL EXAMINATION:  Physical Exam  GENERAL:  68 y.o.-year-old patient lying in the bed with no acute distress.  EYES: Pupils equal, round, reactive to light and accommodation. No scleral icterus. Extraocular muscles intact.  HEENT: Head atraumatic, normocephalic. Oropharynx and nasopharynx clear. No oropharyngeal erythema, moist oral mucosa  NECK:  Supple, no jugular venous distention. No thyroid enlargement, no tenderness.  LUNGS: Normal breath sounds bilaterally, no wheezing, rales, rhonchi. No use of accessory muscles of respiration.  CARDIOVASCULAR: S1, S2 normal. No murmurs, rubs, or gallops.  ABDOMEN: Soft, nontender, nondistended. Bowel sounds present. No organomegaly or mass.  EXTREMITIES: Patient  status post right AKA with dry dressing in place.  No edema on left.   NEUROLOGIC: Cranial nerves II through XII are intact. +global weakness PSYCHIATRIC: The patient is alert and oriented x 3. Good affect.  SKIN:  Right stump dressing as above, no other rashes or lesions appreciated.  Skin on left lower extremity is dry   LABORATORY PANEL:  Male CBC Recent Labs  Lab 10/28/18 1101  WBC 13.5*  HGB 8.2*  HCT 28.4*  PLT 345   ------------------------------------------------------------------------------------------------------------------ Chemistries  Recent Labs  Lab 10/22/18 1948  10/27/18 0835  NA 139   < > 139  K 4.9   < > 5.2*  CL 99   < > 96*  CO2 24   < > 29  GLUCOSE 85   < > 65*  BUN 40*   < > 35*  CREATININE 6.62*   < > 5.52*  CALCIUM 7.9*   < > 8.1*  MG  --    < > 2.0  AST 14*  --   --   ALT 7  --   --   ALKPHOS 73  --   --   BILITOT 0.5  --   --    < > = values in this interval not displayed.   RADIOLOGY:  No results found. ASSESSMENT AND PLAN:   Right lower extremity chronic wound with PAD s/p right AKI 10/25/18. Leukocytosis resolving. -Vascular surgery following- dressing changed today -PT recommending SNF-awaiting placement  Right upper extremity swelling with AV fistula present- resolved -Shuntogram done on 10/24/2018 by vascular surgery  Uncontrolled hypertension -Continue losartan and metoprolol -Add norvasc -IV hydralazine PRN  Hyperkalemia- likely due to ESRD -Will give another dose of  Veltassa today -Recheck potassium in the morning  ESRD on HD MWF -Nephrology following  Chronic diastolic CHF-stable, no signs of acute exacerbation -Volume management per nephrology  Anemia in chronic kidney disease- globin is low, but at baseline. -Management per nephrology  DVT prophylaxis with heparin  Plan for discharge to SNF tomorrow.  All the records are reviewed and case discussed with Care Management/Social Worker. Management plans  discussed with the patient, family and they are in agreement. Called patient's contact listed in the chart Ms. [name]; updated on treatment plans.  All questions answered.  CODE STATUS: Full Code  TOTAL TIME TAKING CARE OF THIS PATIENT: 33 minutes.   More than 50% of the time was spent in counseling/coordination of care: YES  POSSIBLE D/C tomorrow, DEPENDING ON CLINICAL CONDITION.   Berna Spare Mayo M.D on 10/28/2018 at 12:01 PM  Between 7am to 6pm - Pager - [phone]  After 6pm go to [email or web address] - Proofreader  [facility]  Office  [phone]  CC: Primary care physician; [name], MD  Note: This dictation was prepared with Dragon dictation along with smaller phrase technology. Any transcriptional errors that result from this process are unintentional.

## 2018-10-28 NOTE — Progress Notes (Signed)
3 Days Post-Op   Subjective/Chief Complaint: Doing OK. Notes mild pain in right stump. Denies pain in right hand/arm.   Objective: Vital signs in last 24 hours: Temp:  [97.9 F (36.6 C)-98.6 F (37 C)] 97.9 F (36.6 C) (06/21 0440) Pulse Rate:  [81-90] 90 (06/21 0440) Resp:  [15-16] 16 (06/21 0440) BP: (177-197)/(75-82) 197/81 (06/21 0440) SpO2:  [88 %-100 %] 88 % (06/21 0440) Last BM Date: 10/22/18  Intake/Output from previous day: 06/20 0701 - 06/21 0700 In: 240 [P.O.:240] Out: -  Intake/Output this shift: No intake/output data recorded.  General appearance: alert and no distress Cardio: regular rate and rhythm Extremities: .Right Upper Extremity-improved swelling, soft. Lower extremity- stump-dressing changed- warm, soft, incision C/D/I.  Lab Results:  Recent Labs    10/25/18 0950 10/26/18 1010  WBC 24.5* 21.3*  HGB 10.8* 8.6*  HCT 36.4* 28.8*  PLT 384 403*   BMET Recent Labs    10/26/18 1010 10/27/18 0835  NA 140 139  K 5.7* 5.2*  CL 99 96*  CO2 25 29  GLUCOSE 92 65*  BUN 61* 35*  CREATININE 7.95* 5.52*  CALCIUM 8.1* 8.1*   PT/INR Recent Labs    10/25/18 0950  LABPROT 13.9  INR 1.1   ABG No results for input(s): PHART, HCO3 in the last 72 hours.  Invalid input(s): PCO2, PO2  Studies/Results: No results found.  Anti-infectives: Anti-infectives (From admission, onward)   Start     Dose/Rate Route Frequency Ordered Stop   10/25/18 1339  ceFAZolin (ANCEF) 1-4 GM/50ML-% IVPB    Note to Pharmacy: Milinda Cave   : cabinet override      10/25/18 1339 10/25/18 1414   10/25/18 0700  ceFAZolin (ANCEF) IVPB 1 g/50 mL premix  Status:  Discontinued    Note to Pharmacy: Please send with patient to OR   1 g 100 mL/hr over 30 Minutes Intravenous  Once 10/24/18 1348 10/24/18 1811   10/23/18 2145  ceFAZolin (ANCEF) IVPB 1 g/50 mL premix     1 g 100 mL/hr over 30 Minutes Intravenous  Once 10/23/18 2133 10/24/18 0756   10/22/18 1600  vancomycin  (VANCOCIN) 1,500 mg in sodium chloride 0.9 % 500 mL IVPB  Status:  Discontinued     1,500 mg 250 mL/hr over 120 Minutes Intravenous  Once 10/22/18 1546 10/22/18 1739   10/22/18 1600  ceFEPIme (MAXIPIME) 2 g in sodium chloride 0.9 % 100 mL IVPB  Status:  Discontinued     2 g 200 mL/hr over 30 Minutes Intravenous  Once 10/22/18 1547 10/22/18 1739      Assessment/Plan: s/p Procedure(s): AMPUTATION ABOVE KNEE (Right)  Innominate Vein Stenting  Still has elevated WBC- rule out other sources. Stump without evidence of infection. PT/OT       LOS: 6 days    Evaristo Bury 10/28/2018

## 2018-10-29 ENCOUNTER — Inpatient Hospital Stay: Payer: Medicare Other

## 2018-10-29 LAB — CBC
HCT: 26.1 % — ABNORMAL LOW (ref 39.0–52.0)
HCT: 26.3 % — ABNORMAL LOW (ref 39.0–52.0)
Hemoglobin: 7.4 g/dL — ABNORMAL LOW (ref 13.0–17.0)
Hemoglobin: 7.9 g/dL — ABNORMAL LOW (ref 13.0–17.0)
MCH: 25.9 pg — ABNORMAL LOW (ref 26.0–34.0)
MCH: 26.2 pg (ref 26.0–34.0)
MCHC: 28.4 g/dL — ABNORMAL LOW (ref 30.0–36.0)
MCHC: 30 g/dL (ref 30.0–36.0)
MCV: 87.4 fL (ref 80.0–100.0)
MCV: 91.3 fL (ref 80.0–100.0)
Platelets: 363 10*3/uL (ref 150–400)
Platelets: 389 10*3/uL (ref 150–400)
RBC: 2.86 MIL/uL — ABNORMAL LOW (ref 4.22–5.81)
RBC: 3.01 MIL/uL — ABNORMAL LOW (ref 4.22–5.81)
RDW: 15.7 % — ABNORMAL HIGH (ref 11.5–15.5)
RDW: 15.7 % — ABNORMAL HIGH (ref 11.5–15.5)
WBC: 13.4 10*3/uL — ABNORMAL HIGH (ref 4.0–10.5)
WBC: 15.3 10*3/uL — ABNORMAL HIGH (ref 4.0–10.5)
nRBC: 0 % (ref 0.0–0.2)
nRBC: 0 % (ref 0.0–0.2)

## 2018-10-29 LAB — RENAL FUNCTION PANEL
Albumin: 2.1 g/dL — ABNORMAL LOW (ref 3.5–5.0)
Anion gap: 13 (ref 5–15)
BUN: 54 mg/dL — ABNORMAL HIGH (ref 8–23)
CO2: 28 mmol/L (ref 22–32)
Calcium: 8 mg/dL — ABNORMAL LOW (ref 8.9–10.3)
Chloride: 99 mmol/L (ref 98–111)
Creatinine, Ser: 8.35 mg/dL — ABNORMAL HIGH (ref 0.61–1.24)
GFR calc Af Amer: 7 mL/min — ABNORMAL LOW (ref >60)
GFR calc non Af Amer: 6 mL/min — ABNORMAL LOW (ref >60)
Glucose, Bld: 128 mg/dL — ABNORMAL HIGH (ref 70–99)
Phosphorus: 5.7 mg/dL — ABNORMAL HIGH (ref 2.5–4.6)
Potassium: 6.3 mmol/L (ref 3.5–5.1)
Sodium: 140 mmol/L (ref 135–145)

## 2018-10-29 LAB — SURGICAL PATHOLOGY

## 2018-10-29 MED ORDER — SODIUM POLYSTYRENE SULFONATE 15 GM/60ML PO SUSP
15.0000 g | Freq: Once | ORAL | Status: AC
  Administered 2018-10-29: 15 g via ORAL
  Filled 2018-10-29: qty 60

## 2018-10-29 NOTE — Progress Notes (Signed)
Nutrition Follow Up Note   DOCUMENTATION CODES:   Not applicable  INTERVENTION:   Nepro Shake po TID, each supplement provides 425 kcal and 19 grams protein  Rena-vite daily   Vitamin C 258m po BID  Ocuvite daily for wound healing (provides zinc, vitamin A, vitamin C, Vitamin E, copper, and selenium)  Dysphagia 3 diet with Na and K restrictions in health touch  Bowel regimen as needed per MD  NUTRITION DIAGNOSIS:   Increased nutrient needs related to wound healing, chronic illness(ESRD on HD) as evidenced by increased estimated needs.  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  Pt continues to have poor appetite and oral intake; pt eating <50% of meals and is drinking some Nepro. RD will change patient to dysphagia 3 diet and loosen some of the renal restrictions as pt not likely eating enough to exceed nutrient limits. Pt down 8 lbs since admit; RD unsure how much of this is true weight loss as pt now s/p AKA. Recommend continue supplements and vitamins to support wound healing. Consider appetite stimulant if pt's oral intake does not improve as pt will need adequate nutrition to support healing. No BM documented since 6/15; recommend bowel regimen as needed per MD.   Medications reviewed and include: allopurinol, aspirin, plavix, epoetin, heparin, renvela, rena-vite, ocuvite, miralax, vit C  Labs  reviewed: K 6.3(H), BUN 54(H), creat 8.35(H), P 5.7(H) Wbc- 13.4(H), Hgb 7.4(L), Hct 26.1(L), MCH 25.9(L), MCHC 28.4(L)   Diet Order:   Diet Order            DIET DYS 3 Room service appropriate? Yes; Fluid consistency: Thin; Fluid restriction: 1200 mL Fluid  Diet effective now             EDUCATION NEEDS:   Education needs have been addressed  Skin:  Skin Assessment: Reviewed RN Assessment(chronic venous stasis ulcer R leg)  Last BM:  6/15  Height:   Ht Readings from Last 1 Encounters:  10/24/18 '5\' 8"'  (1.727 m)    Weight:   Wt Readings from Last 1 Encounters:  10/29/18 65.7 kg    Ideal Body Weight:  61.6 kg(Adjusted for AKA)  BMI:  Body mass index is 22.02 kg/m.  Estimated Nutritional Needs:   Kcal:  1900-2200kcal/day  Protein:  105-120g/day  Fluid:  UOP + 1L  CKoleen DistanceMS, RD, LDN Pager #- [phone]#- [phone] Hours Pager: [phone]

## 2018-10-29 NOTE — Progress Notes (Signed)
Post HD Assessment 1.0 liter fluid removal. Hypotensive episode led to pt not meeting UF goal of 1.5 liters    10/29/18 1455  Neurological  Level of Consciousness Alert  Orientation Level Oriented to person;Disoriented to place;Disoriented to time;Disoriented to situation  Respiratory  Respiratory Pattern Regular  Chest Assessment Chest expansion symmetrical  Bilateral Breath Sounds Clear;Diminished  Cardiac  Pulse Regular  Jugular Venous Distention (JVD) No  ECG Monitor Yes  Cardiac Rhythm ST  Ectopy Multifocal PVC's  Ectopy Frequency Frequent  Antiarrhythmic device No  Vascular  R Radial Pulse +1  L Radial Pulse +1  Edema Generalized  Generalized Edema +1  RUE Edema +3  RLE Edema +1  Facial Other (Comment) (nonpitting)  Integumentary  Integumentary (WDL) X  Skin Color Appropriate for ethnicity  Skin Condition Dry  Skin Integrity Surgical Incision (see LDA)  Musculoskeletal  Musculoskeletal (WDL) X  Generalized Weakness Yes  GU Assessment  Genitourinary (WDL) X  Genitourinary Symptoms Anuria  Psychosocial  Psychosocial (WDL) X  Patient Behaviors Agitated  Needs Expressed Physical  Emotional support given Given to patient  Wound/Incision (LDAs)  Type of Wound/Incision (LDA) Wound  Incision (Closed) 10/25/18 Thigh Right  Date First Assessed/Time First Assessed: 10/25/18 1433   Location: Thigh  Location Orientation: Right  Dressing Type Other (Comment) (surgical dressing)  Dressing Clean;Dry;Intact  Dressing Change Frequency Other (Comment) (surgical dressing, not accessed by MD yet)  Site / Wound Assessment Dressing in place / Unable to assess  Wound / Incision (Open or Dehisced) 10/25/18 Incision - Open Thigh Anterior;Distal;Right AKA  Date First Assessed: 10/25/18   Wound Type: Incision - Open  Location: Thigh  Location Orientation: Anterior;Distal;Right  Wound Description (Comments): AKA  Present on Admission: No  Dressing Type Gauze (Comment);Compression  wrap;ABD;Transparent dressing  Dressing Status Clean;Dry;Intact

## 2018-10-29 NOTE — Progress Notes (Signed)
Pre Tx Assessment    10/29/18 1025  Neurological  Level of Consciousness Alert  Orientation Level Oriented to person;Disoriented to place;Disoriented to time;Disoriented to situation  Respiratory  Respiratory Pattern Regular  Chest Assessment Chest expansion symmetrical  Bilateral Breath Sounds Clear;Diminished  Cardiac  Pulse Regular  Jugular Venous Distention (JVD) No  ECG Monitor Yes  Cardiac Rhythm ST  Ectopy Multifocal PVC's  Ectopy Frequency Frequent  Antiarrhythmic device No  Vascular  R Radial Pulse +1  L Radial Pulse +1  Edema Generalized  Generalized Edema +1  RUE Edema +3  RLE Edema +1  Facial Other (Comment) (nonpitting)  Integumentary  Integumentary (WDL) X  Skin Color Appropriate for ethnicity  Skin Condition Dry  Skin Integrity Surgical Incision (see LDA)  Musculoskeletal  Musculoskeletal (WDL) X  Generalized Weakness Yes  GU Assessment  Genitourinary (WDL) X  Genitourinary Symptoms Anuria  Psychosocial  Psychosocial (WDL) X  Patient Behaviors Agitated  Needs Expressed Physical  Emotional support given Given to patient  Wound/Incision (LDAs)  Type of Wound/Incision (LDA) Wound  Incision (Closed) 10/25/18 Thigh Right  Date First Assessed/Time First Assessed: 10/25/18 1433   Location: Thigh  Location Orientation: Right  Dressing Type Other (Comment) (surgical dressing)  Dressing Clean;Dry;Intact  Dressing Change Frequency Other (Comment) (surgical dressing, not accessed by MD yet)  Site / Wound Assessment Dressing in place / Unable to assess  Wound / Incision (Open or Dehisced) 10/25/18 Incision - Open Thigh Anterior;Distal;Right AKA  Date First Assessed: 10/25/18   Wound Type: Incision - Open  Location: Thigh  Location Orientation: Anterior;Distal;Right  Wound Description (Comments): AKA  Present on Admission: No  Dressing Type Gauze (Comment);Compression wrap;ABD;Transparent dressing  Dressing Status Clean;Dry;Intact

## 2018-10-29 NOTE — Progress Notes (Signed)
HD Tx End  1.0 liter fluid removal. Hypotensive episode led to pt not meeting UF goal of 1.5 liters.     10/29/18 1345  Hand-Off documentation  Report given to (Full Name) Beth RN 2C  Report received from (Full Name) Trellis Paganini RN  Vital Signs  Temp 98 F (36.7 C)  Temp Source Oral  Pulse Rate 95  Pulse Rate Source Monitor  Resp 10  BP (!) 115/56  BP Location Left Arm  BP Method Automatic  Patient Position (if appropriate) Lying  Oxygen Therapy  SpO2 93 %  O2 Device Room Air  Pain Assessment  Pain Scale 0-10  Pain Score 10  Pain Type Surgical pain  Pain Location Leg  Pain Orientation Right  Pain Descriptors / Indicators Sharp  Pain Intervention(s) RN made aware  Dialysis Weight  Weight 65.7 kg  Type of Weight Post-Dialysis  During Hemodialysis Assessment  Blood Flow Rate (mL/min) 200 mL/min  Arterial Pressure (mmHg) -190 mmHg  Venous Pressure (mmHg) 180 mmHg  Transmembrane Pressure (mmHg) 60 mmHg  Ultrafiltration Rate (mL/min) 0 mL/min  Dialysate Flow Rate (mL/min) 600 ml/min  Conductivity: Machine  14  HD Safety Checks Performed Yes  Dialysis Fluid Bolus Normal Saline  Bolus Amount (mL) 250 mL  Intra-Hemodialysis Comments Tx completed  Post-Hemodialysis Assessment  Rinseback Volume (mL) 250 mL  Dialyzer Clearance Lightly streaked  Duration of HD Treatment -hour(s) 3 hour(s)  Hemodialysis Intake (mL) 500 mL  UF Total -Machine (mL) 1515 mL  Net UF (mL) 1015 mL  Tolerated HD Treatment Yes  AVG/AVF Arterial Site Held (minutes) 12 minutes  AVG/AVF Venous Site Held (minutes) 12 minutes  Fistula / Graft Right Upper arm Arteriovenous vein graft  Placement Date/Time: 07/19/18 1324   Placed prior to admission: No  Orientation: Right  Access Location: (c) Upper arm  Access Type: Arteriovenous vein graft  Site Condition No complications  Fistula / Graft Assessment Present;Thrill;Bruit  Status Deaccessed  Drainage Description None

## 2018-10-29 NOTE — Progress Notes (Signed)
[place] [facility] [place] NAME: Johnny [name]    MR#:  [id number]  DATE OF BIRTH:  November 06, 1950  SUBJECTIVE:   Patient states that he is doing well this morning.  He is still having some stump pain, especially when he moves around.  No other concerns this morning.  He is agreeable to SNF on discharge.  REVIEW OF SYSTEMS:  Review of Systems  Constitutional: Negative for chills and fever.  HENT: Negative for hearing loss and tinnitus.   Eyes: Negative for blurred vision and double vision.  Respiratory: Negative for cough and shortness of breath.   Cardiovascular: Negative for chest pain and palpitations.  Gastrointestinal: Negative for heartburn, nausea and vomiting.  Genitourinary: Negative for dysuria and urgency.  Musculoskeletal: Positive for joint pain. Negative for myalgias and neck pain.  Skin: Negative for itching and rash.  Neurological: Negative for dizziness and headaches.  Psychiatric/Behavioral: Negative for depression and hallucinations.    DRUG ALLERGIES:   Allergies  Allergen Reactions  . Shellfish Allergy Anaphylaxis   VITALS:  Blood pressure (!) 101/54, pulse (!) 102, temperature 98.6 F (37 C), temperature source Oral, resp. rate 18, height 5\' 8"  (1.727 m), weight 66.7 kg, SpO2 98 %. PHYSICAL EXAMINATION:  Physical Exam  GENERAL:  68 y.o.-year-old patient lying in the bed with no acute distress.  EYES: Pupils equal, round, reactive to light and accommodation. No scleral icterus. Extraocular muscles intact.  HEENT: Head atraumatic, normocephalic. Oropharynx and nasopharynx clear. No oropharyngeal erythema, moist oral mucosa  NECK:  Supple, no jugular venous distention. No thyroid enlargement, no tenderness.  LUNGS: Normal breath sounds bilaterally, no wheezing, rales, rhonchi. No use of accessory muscles of respiration.  CARDIOVASCULAR: RRR, S1, S2 normal. No murmurs, rubs, or gallops.  ABDOMEN: Soft, nontender,  nondistended. Bowel sounds present. No organomegaly or mass.  EXTREMITIES: Patient status post right AKA with dry dressing in place.  No edema on left.   NEUROLOGIC: Cranial nerves II through XII are intact. +global weakness PSYCHIATRIC: The patient is alert and oriented x 3. Good affect.  SKIN:  Right stump dressing as above, no other rashes or lesions appreciated.  Skin on left lower extremity is dry   LABORATORY PANEL:  Male CBC Recent Labs  Lab 10/29/18 0017  WBC 15.3*  HGB 7.9*  HCT 26.3*  PLT 389   ------------------------------------------------------------------------------------------------------------------ Chemistries  Recent Labs  Lab 10/22/18 1948  10/27/18 0835  10/29/18 0017  NA 139   < > 139   < > 140  K 4.9   < > 5.2*   < > 6.3*  CL 99   < > 96*   < > 99  CO2 24   < > 29   < > 28  GLUCOSE 85   < > 65*   < > 128*  BUN 40*   < > 35*   < > 54*  CREATININE 6.62*   < > 5.52*   < > 8.35*  CALCIUM 7.9*   < > 8.1*   < > 8.0*  MG  --    < > 2.0  --   --   AST 14*  --   --   --   --   ALT 7  --   --   --   --   ALKPHOS 73  --   --   --   --   BILITOT 0.5  --   --   --   --    < > =  values in this interval not displayed.   RADIOLOGY:  Dg Chest 1 View  Result Date: 10/29/2018 CLINICAL DATA:  68 year old male with leukocytosis. EXAM: CHEST  1 VIEW COMPARISON:  07/21/2018 and earlier. FINDINGS: Portable AP upright view at 0734 hours. Right chest dialysis type catheter has been removed. New right thoracic inlet vascular stent. Chronic left subclavian and axillary vascular stents. Superimposed calcified atherosclerosis in both axilla. Lower lung volumes. Stable mild cardiomegaly and other mediastinal contours. Visualized tracheal air column is within normal limits. Mild veiling opacity at both lung bases and trace fluid in the right minor fissure. No pneumothorax. Stable pulmonary vascularity without overt edema. No air bronchograms. Negative visible bowel gas pattern and  osseous structures. IMPRESSION: Lower lung volumes with small bilateral pleural effusions suspected. Stable pulmonary vascularity without overt edema. Electronically Signed   By: Genevie Ann M.D.   On: 10/29/2018 08:03   ASSESSMENT AND PLAN:   Right lower extremity chronic wound with PAD s/p right AKI 10/25/18.  -Vascular surgery following- dressing changed today -PT recommending SNF-awaiting placement  Right upper extremity swelling with AV fistula present- resolved -Shuntogram done on 10/24/2018 by vascular surgery  Leukocytosis- was trending down, but then worsened from yesterday to today. No infectious symptoms. Per vascular, stump does not appear infected. -Check CXR  Hypertension -Continue losartan, metoprolol, and norvasc -IV hydralazine PRN  Hyperkalemia- likely due to ESRD -HD today -Received Kayexalate early this morning -Recheck potassium in the morning  ESRD on HD MWF -Nephrology following  Chronic diastolic CHF-stable, no signs of acute exacerbation -Volume management per nephrology  Anemia in chronic kidney disease- globin is low, but at baseline. -Management per nephrology  DVT prophylaxis with heparin  Plan for discharge to SNF, likely tomorrow.  All the records are reviewed and case discussed with Care Management/Social Worker. Management plans discussed with the patient, family and they are in agreement. Called patient's contact listed in the chart Ms. [name]; updated on treatment plans.  All questions answered.  CODE STATUS: Full Code  TOTAL TIME TAKING CARE OF THIS PATIENT: 38 minutes.   More than 50% of the time was spent in counseling/coordination of care: YES  POSSIBLE D/C tomorrow, DEPENDING ON CLINICAL CONDITION.   Berna Spare Eupha Lobb M.D on 10/29/2018 at 1:40 PM  Between 7am to 6pm - Pager - [phone]  After 6pm go to [email or web address] - Proofreader  [facility]  Office  [phone]  CC: Primary care physician;  [name], MD  Note: This dictation was prepared with Dragon dictation along with smaller phrase technology. Any transcriptional errors that result from this process are unintentional.

## 2018-10-29 NOTE — TOC Progression Note (Signed)
Transition of Care (TOC) - Progression Note    Patient Details  Name: Johnny [name]. MRN: [id number] Date of Birth: [birth year]  Transition of Care [facility]) CM/SW Contact  Beverly Sessions, RN Phone Number: 10/29/2018, 3:51 PM  Clinical Narrative:    [facility].  Both patient and his friend [name] select [facility].  Accepted in [place]. Tina at [facility] notified and to start insurances auth.  Sent updated clinical    Expected Discharge Plan: Skilled Nursing Facility Barriers to Discharge: No Barriers Identified  Expected Discharge Plan and Services Expected Discharge Plan: [place]                                               Social Determinants of Health (SDOH) Interventions    Readmission Risk Interventions No flowsheet data found.

## 2018-10-29 NOTE — Progress Notes (Signed)
Pre HD Tx    10/29/18 1020  Hand-Off documentation  Report given to (Full Name) Trellis Paganini RN  Report received from (Full Name) Beth RN 2C  Vital Signs  Temp 98.6 F (37 C)  Temp Source Oral  Pulse Rate 90  Pulse Rate Source Monitor  Resp 12  BP 118/61  BP Location Left Arm  BP Method Automatic  Patient Position (if appropriate) Lying  Oxygen Therapy  SpO2 95 %  O2 Device Room Air  Pain Assessment  Pain Scale 0-10  Pain Score 0  Dialysis Weight  Weight 66.7 kg  Type of Weight Pre-Dialysis  Time-Out for Hemodialysis  What Procedure? Hemodialysis   Pt Identifiers(min of two) First/Last Name;MRN/Account#  Correct Site? Yes  Correct Side? Yes  Correct Procedure? Yes  Consents Verified? Yes  Rad Studies Available? N/A  Safety Precautions Reviewed? Yes  Engineer, civil (consulting) Number 2  Station Number 4  UF/Alarm Test Passed  Conductivity: Meter 14  Conductivity: Machine  14  pH 7  Reverse Osmosis Main  Normal Saline Lot Number [id number]  Dialyzer Lot Number [id number]  Disposable Set Lot Number [id number]  Machine Temperature 98.6 F (37 C)  Musician and Audible Yes  Blood Lines Intact and Secured Yes  Pre Treatment Patient Checks  Vascular access used during treatment Fistula  Patient is receiving dialysis in a chair Yes  Hepatitis B Surface Antigen Results Negative  Date Hepatitis B Surface Antigen Drawn 10/29/18  Isolation Initiated Yes  Hepatitis B Surface Antibody 17.7  Date Hepatitis B Surface Antibody Drawn 10/29/18  Hemodialysis Consent Verified Yes  Hemodialysis Standing Orders Initiated Yes  ECG (Telemetry) Monitor On Yes  Prime Ordered Normal Saline  Length of  DialysisTreatment -hour(s) 3 Hour(s)  Dialysis Treatment Comments Na 140  Dialyzer Elisio 17H NR  Dialysate 2K;2.5 Ca  Dialysate Flow Ordered 600  Blood Flow Rate Ordered 400 mL/min  Ultrafiltration Goal 1.5 Liters  Dialysis Blood Pressure Support Ordered Normal Saline  Education  / Care Plan  Dialysis Education Provided Yes  Documented Education in Care Plan Yes  Fistula / Graft Right Upper arm Arteriovenous vein graft  Placement Date/Time: 07/19/18 1324   Placed prior to admission: No  Orientation: Right  Access Location: (c) Upper arm  Access Type: Arteriovenous vein graft  Site Condition No complications  Fistula / Graft Assessment Present;Thrill;Bruit  Status Accessed  Drainage Description None

## 2018-10-29 NOTE — Progress Notes (Signed)
[facility]  ROUNDING NOTE   Subjective:   Patient seen during dialysis Tolerating well    HEMODIALYSIS FLOWSHEET:  Blood Flow Rate (mL/min): 400 mL/min Arterial Pressure (mmHg): -190 mmHg Venous Pressure (mmHg): 190 mmHg Transmembrane Pressure (mmHg): 60 mmHg Ultrafiltration Rate (mL/min): 0 mL/min Dialysate Flow Rate (mL/min): 600 ml/min Conductivity: Machine : 14 Conductivity: Machine : 14 Dialysis Fluid Bolus: Normal Saline Bolus Amount (mL): 250 mL    Objective:  Vital signs in last 24 hours:  Temp:  [98 F (36.7 C)-99.1 F (37.3 C)] 98.6 F (37 C) (06/22 1020) Pulse Rate:  [80-111] 111 (06/22 1200) Resp:  [12-20] 13 (06/22 1200) BP: (96-169)/(53-84) 111/58 (06/22 1200) SpO2:  [93 %-100 %] 93 % (06/22 1200) Weight:  [66.7 kg] 66.7 kg (06/22 1020)  Weight change:  Filed Weights   10/26/18 1715 10/27/18 0500 10/29/18 1020  Weight: 69 kg 70 kg 66.7 kg    Intake/Output: I/O last 3 completed shifts: In: 200 [P.O.:200] Out: 0    Intake/Output this shift:  No intake/output data recorded.  Physical Exam: General: NAD,   Head: Normocephalic, atraumatic. Moist oral mucosal membranes  Eyes: Anicteric,    Neck: Supple,    Lungs:  Clear to auscultation  Heart: Regular rate and rhythm  Abdomen:  Soft, nontender,   Extremities:  right above the knee amputation wound dressing clean and dry  Neurologic:  Resting quietly  Access: Right arm AVG    Basic Metabolic Panel: Recent Labs  Lab 10/25/18 0950 10/26/18 1010 10/27/18 0835 10/28/18 1101 10/29/18 0017  NA 139 140 139 141 140  K 5.6* 5.7* 5.2* 4.6 6.3*  CL 97* 99 96* 98 99  CO2 27 25 29 29 28   GLUCOSE 130* 92 65* 88 128*  BUN 42* 61* 35* 48* 54*  CREATININE 6.46* 7.95* 5.52* 7.30* 8.35*  CALCIUM 8.4* 8.1* 8.1* 8.1* 8.0*  MG 2.0 2.0 2.0  --   --   PHOS  --  7.4*  --  5.3* 5.7*    Liver Function Tests: Recent Labs  Lab 10/22/18 1948 10/28/18 1101 10/29/18 0017  AST 14*  --   --    ALT 7  --   --   ALKPHOS 73  --   --   BILITOT 0.5  --   --   PROT 6.8  --   --   ALBUMIN 2.7* 2.4* 2.1*   No results for input(s): LIPASE, AMYLASE in the last 168 hours. No results for input(s): AMMONIA in the last 168 hours.  CBC: Recent Labs  Lab 10/22/18 1948 10/23/18 0814 10/25/18 0950 10/26/18 1010 10/28/18 1101 10/29/18 0017  WBC 17.2* 13.8* 24.5* 21.3* 13.5* 15.3*  NEUTROABS 13.8*  --   --   --   --   --   HGB 8.9* 8.1* 10.8* 8.6* 8.2* 7.9*  HCT 29.8* 27.5* 36.4* 28.8* 28.4* 26.3*  MCV 90.0 91.1 89.7 89.4 91.3 87.4  PLT 249 288 384 403* 345 389    Cardiac Enzymes: No results for input(s): CKTOTAL, CKMB, CKMBINDEX, TROPONINI in the last 168 hours.  BNP: Invalid input(s): POCBNP  CBG: No results for input(s): GLUCAP in the last 168 hours.  Microbiology: Results for orders placed or performed during the hospital encounter of 10/22/18  MRSA PCR Screening     Status: None   Collection Time: 10/22/18  2:16 PM   Specimen: Nasal Mucosa; Nasopharyngeal  Result Value Ref Range Status   MRSA by PCR NEGATIVE NEGATIVE Final  Comment:        The GeneXpert MRSA Assay (FDA approved for NASAL specimens only), is one component of a comprehensive MRSA colonization surveillance program. It is not intended to diagnose MRSA infection nor to guide or monitor treatment for MRSA infections. Performed at [facility], [address] [place], [place] [address]   Novel Coronavirus,NAA,(SEND-OUT TO REF LAB - TAT 24-48 hrs); Hosp Order     Status: None   Collection Time: 10/22/18  3:52 PM   Specimen: Nasopharyngeal Swab; Respiratory  Result Value Ref Range Status   SARS-CoV-2, NAA NOT DETECTED NOT DETECTED Final    Comment: (NOTE) This test was developed and its performance characteristics determined by [employer]. This test has not been FDA cleared or approved. This test has been authorized by FDA under an Emergency Use Authorization (EUA). This test  is only authorized for the duration of time the declaration that circumstances exist justifying the authorization of the emergency use of in vitro diagnostic tests for detection of SARS-CoV-2 virus and/or diagnosis of COVID-19 infection under section 564(b)(1) of the Act, 21 U.S.C. 270WCB-7(S)(2), unless the authorization is terminated or revoked sooner. When diagnostic testing is negative, the possibility of a false negative result should be considered in the context of a patient's recent exposures and the presence of clinical signs and symptoms consistent with COVID-19. An individual without symptoms of COVID-19 and who is not shedding SARS-CoV-2 virus would expect to have a negative (not detected) result in this assay. Performed  At: [facility] [address] [place], [place] [id number] Rush Farmer MD WV:[id number]    [place]  Final    Comment: Performed at [facility], [address] [place], [place] [address]  Blood Culture (routine x 2)     Status: None   Collection Time: 10/23/18  8:15 AM   Specimen: BLOOD  Result Value Ref Range Status   Specimen Description BLOOD BLOOD LEFT HAND  Final   Special Requests   Final    BOTTLES DRAWN AEROBIC AND ANAEROBIC Blood Culture adequate volume   Culture   Final    NO GROWTH 5 DAYS Performed at [facility], [address] [place], [place] [address]    Report Status 10/28/2018 FINAL  Final  Blood Culture (routine x 2)     Status: None   Collection Time: 10/23/18  8:32 AM   Specimen: BLOOD  Result Value Ref Range Status   Specimen Description BLOOD BLOOD RIGHT WRIST  Final   Special Requests   Final    BOTTLES DRAWN AEROBIC AND ANAEROBIC Blood Culture adequate volume   Culture   Final    NO GROWTH 5 DAYS Performed at [facility], [address] [place], [place] [address]    Report Status 10/28/2018 FINAL  Final    Coagulation Studies: No results for  input(s): LABPROT, INR in the last 72 hours.  Urinalysis: No results for input(s): COLORURINE, LABSPEC, PHURINE, GLUCOSEU, HGBUR, BILIRUBINUR, KETONESUR, PROTEINUR, UROBILINOGEN, NITRITE, LEUKOCYTESUR in the last 72 hours.  Invalid input(s): APPERANCEUR    Imaging: Dg Chest 1 View  Result Date: 10/29/2018 CLINICAL DATA:  68 year old male with leukocytosis. EXAM: CHEST  1 VIEW COMPARISON:  07/21/2018 and earlier. FINDINGS: Portable AP upright view at 0734 hours. Right chest dialysis type catheter has been removed. New right thoracic inlet vascular stent. Chronic left subclavian and axillary vascular stents. Superimposed calcified atherosclerosis in both axilla. Lower lung volumes. Stable mild cardiomegaly and other mediastinal  contours. Visualized tracheal air column is within normal limits. Mild veiling opacity at both lung bases and trace fluid in the right minor fissure. No pneumothorax. Stable pulmonary vascularity without overt edema. No air bronchograms. Negative visible bowel gas pattern and osseous structures. IMPRESSION: Lower lung volumes with small bilateral pleural effusions suspected. Stable pulmonary vascularity without overt edema. Electronically Signed   By: Genevie Ann M.D.   On: 10/29/2018 08:03     Medications:    . allopurinol  100 mg Oral Daily  . amLODipine  5 mg Oral Daily  . aspirin EC  81 mg Oral Daily  . atorvastatin  10 mg Oral Daily  . clopidogrel  75 mg Oral Daily  . epoetin (EPOGEN/PROCRIT) injection  10,000 Units Intravenous Q M,W,F-HD  . feeding supplement (NEPRO CARB STEADY)  237 mL Oral TID BM  . gabapentin  100 mg Oral BID  . heparin injection (subcutaneous)  5,000 Units Subcutaneous Q8H  . losartan  100 mg Oral Daily  . metoprolol tartrate  25 mg Oral BID  . multivitamin  1 tablet Oral QHS  . multivitamin-lutein  1 capsule Oral Daily  . polyethylene glycol  17 g Oral Daily  . sevelamer carbonate  2,400 mg Oral TID WC  . vitamin C  250 mg Oral BID    acetaminophen **OR** acetaminophen, albuterol, clonazePAM, hydrALAZINE, HYDROcodone-acetaminophen, HYDROmorphone (DILAUDID) injection, morphine injection, ondansetron **OR** ondansetron (ZOFRAN) IV  Assessment/ Plan:  [name] is a 68 y.o. black male with end stage renal disease on hemodialysis, hypertension, peripheral vascular disease, congestive heart failure  [address] MWF 66kg Right AVG   1. End Stage Renal Disease:   Complication with dialysis access, Status post angiogram 6/17 Dr. Lucky Cowboy with balloon angioplasty  - MWF schedule.   2.   Anemia of chronic kidney disease: Lab Results  Component Value Date   HGB 7.4 (L) 10/29/2018   - EPO with HD treatment  3. Secondary Hyperparathyroidism: Lab Results  Component Value Date   PTH 553 (H) 09/30/2015   CALCIUM 8.0 (L) 10/29/2018   PHOS 5.7 (H) 10/29/2018   - Continue sevelamer with meals.   4 Peripheral vascular disease with cellulitis.  status post RIGHT amputation (above the knee) 6/18 Dr. Lucky Cowboy    LOS: [place] 6/22/202012:13 PM

## 2018-10-29 NOTE — Care Management Important Message (Signed)
Important Message  Patient Details  Name: Johnny [name]. MRN: [id number] Date of Birth: 21-Dec-1950   Medicare Important Message Given:  Yes     Dannette Barbara 10/29/2018, 11:28 AM

## 2018-10-30 ENCOUNTER — Encounter (INDEPENDENT_AMBULATORY_CARE_PROVIDER_SITE_OTHER): Payer: Medicare Other

## 2018-10-30 ENCOUNTER — Ambulatory Visit (INDEPENDENT_AMBULATORY_CARE_PROVIDER_SITE_OTHER): Payer: Medicare Other | Admitting: Vascular Surgery

## 2018-10-30 LAB — RENAL FUNCTION PANEL
Albumin: 2.5 g/dL — ABNORMAL LOW (ref 3.5–5.0)
Anion gap: 16 — ABNORMAL HIGH (ref 5–15)
BUN: 30 mg/dL — ABNORMAL HIGH (ref 8–23)
CO2: 28 mmol/L (ref 22–32)
Calcium: 8.4 mg/dL — ABNORMAL LOW (ref 8.9–10.3)
Chloride: 97 mmol/L — ABNORMAL LOW (ref 98–111)
Creatinine, Ser: 5.81 mg/dL — ABNORMAL HIGH (ref 0.61–1.24)
GFR calc Af Amer: 11 mL/min — ABNORMAL LOW (ref >60)
GFR calc non Af Amer: 9 mL/min — ABNORMAL LOW (ref >60)
Glucose, Bld: 71 mg/dL (ref 70–99)
Phosphorus: 5 mg/dL — ABNORMAL HIGH (ref 2.5–4.6)
Potassium: 4.5 mmol/L (ref 3.5–5.1)
Sodium: 141 mmol/L (ref 135–145)

## 2018-10-30 LAB — CBC
HCT: 27.5 % — ABNORMAL LOW (ref 39.0–52.0)
Hemoglobin: 7.8 g/dL — ABNORMAL LOW (ref 13.0–17.0)
MCH: 26.4 pg (ref 26.0–34.0)
MCHC: 28.4 g/dL — ABNORMAL LOW (ref 30.0–36.0)
MCV: 93.2 fL (ref 80.0–100.0)
Platelets: 348 10*3/uL (ref 150–400)
RBC: 2.95 MIL/uL — ABNORMAL LOW (ref 4.22–5.81)
RDW: 15.9 % — ABNORMAL HIGH (ref 11.5–15.5)
WBC: 11.8 10*3/uL — ABNORMAL HIGH (ref 4.0–10.5)
nRBC: 0 % (ref 0.0–0.2)

## 2018-10-30 MED ORDER — HYDROCODONE-ACETAMINOPHEN 5-325 MG PO TABS: 1.0000 | ORAL_TABLET | Freq: Four times a day (QID) | ORAL | 0 refills | Status: DC | PRN

## 2018-10-30 MED ORDER — ASPIRIN 81 MG PO TBEC: 81.0000 mg | DELAYED_RELEASE_TABLET | Freq: Every day | ORAL | 0 refills | Status: DC

## 2018-10-30 MED ORDER — AMLODIPINE BESYLATE 5 MG PO TABS: 5.0000 mg | ORAL_TABLET | Freq: Every day | ORAL | 0 refills | Status: DC

## 2018-10-30 NOTE — Discharge Instructions (Signed)
Vascular Surgery Discharge Instructions 1) Change stump dressing daily or sooner if drainage is noted. 2) Kerlix dressing to stump.

## 2018-10-30 NOTE — Progress Notes (Signed)
Patient was just  Picked up by EMS

## 2018-10-30 NOTE — TOC Transition Note (Signed)
Transition of Care [facility]) - CM/SW Discharge Note   Patient Details  Name: Kyo Cocuzza. MRN: [id number] Date of Birth: 04/19/1951  Transition of Care [facility]) CM/SW Contact:  Beverly Sessions, RN Phone Number: 10/30/2018, 10:36 AM   Clinical Narrative:    Patient to be discharged to [facility] today.   Per [name] at [facility] has been obtained and he will be going to room 210  Patient to transport by EMS.  EMS packet placed on chart.  Bedside RN notified.   Elvera Bicker dialysis liaison notified of discharge  Patients friend [name] notified of discharge plan.    Final next level of care: Skilled Nursing Facility Barriers to Discharge: Barriers Resolved   Patient Goals and CMS Choice   CMS Medicare.gov Compare Post Acute Care list provided to:: Patient Represenative (must comment) Choice offered to / list presented to : [place] / [place]  Discharge Placement              Patient chooses bed at: [facility] [place] Patient to be transferred to facility by: EMS Name of family member notified: Elmer Sow Patient and family notified of of transfer: 10/30/18  Discharge Plan and Services                                     Social Determinants of Health (SDOH) Interventions     Readmission Risk Interventions No flowsheet data found.

## 2018-10-30 NOTE — Discharge Summary (Signed)
[facility] [place] NAME: Johnny [name]    MR#:  [id number]  DATE OF BIRTH:  [birth year]  DATE OF ADMISSION:  10/22/2018   ADMITTING PHYSICIAN: Hillary Bow, MD  DATE OF DISCHARGE: 10/30/18  PRIMARY CARE PHYSICIAN: [name], MD   ADMISSION DIAGNOSIS:  Swelling [R60.9] Edema of upper extremity [R60.0] Cellulitis of right lower leg [M84.132] DISCHARGE DIAGNOSIS:  Active Problems:   Leg ulcer, right, with fat layer exposed ([place])  SECONDARY DIAGNOSIS:   Past Medical History:  Diagnosis Date  . Anemia   . Anxiety   . CHF (congestive heart failure) ([place])   . Chronic kidney disease    esrd dialysis m/w/f  . Gout   . Hyperlipidemia   . Hypertension   . Myocardial infarction ([place]) 2010   10 years ago  . 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  . Peripheral vascular disease [facility])    HOSPITAL COURSE:   Johnny [name] is a 68 year old male who presented to the ED with a chronic right lower extremity wound with worsening swelling.  He was admitted for further management.  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 -Management per nephrology  DISCHARGE CONDITIONS:  Right AKA Hypertension ESRD on HD MWF Chronic diastolic congestive heart failure Anemia of chronic kidney disease CONSULTS OBTAINED:  Treatment Team:  Lavonia Dana, MD Algernon Huxley, MD DRUG ALLERGIES:   Allergies   Allergen Reactions  . Shellfish Allergy Anaphylaxis   DISCHARGE MEDICATIONS:   Allergies as of 10/30/2018      Reactions   Shellfish Allergy Anaphylaxis      Medication List    STOP taking these medications   apixaban 2.5 MG Tabs tablet Commonly known as: ELIQUIS     TAKE these medications   allopurinol 100 MG tablet Commonly known as: ZYLOPRIM Take 100 mg by mouth daily.   amLODipine 5 MG tablet Commonly known as: NORVASC Take 1 tablet (5 mg total) by mouth daily.   aspirin 81 MG EC tablet Take 1 tablet (81 mg total) by mouth daily.   atorvastatin 10 MG tablet Commonly known as: LIPITOR Take 1 tablet by mouth daily.   clonazePAM 0.5 MG tablet Commonly known as: KLONOPIN Take 1 tablet (0.5 mg total) by mouth as needed. What changed:  how much to take when to take this reasons to take this   clopidogrel 75 MG tablet Commonly known as: Plavix Take 1 tablet (75 mg total) by mouth daily.   folic acid-vitamin b complex-vitamin c-selenium-zinc 3 MG Tabs tablet Take 1 tablet by mouth daily.   gabapentin 100 MG capsule Commonly known as: NEURONTIN Take 1 capsule (100 mg total) by mouth at bedtime. What changed: when to take this   HYDROcodone-acetaminophen 5-325 MG tablet Commonly known as: Norco Take 1 tablet by mouth every 6 (six) hours as needed for moderate pain.   losartan 100 MG tablet Commonly known as: COZAAR Take 100 mg by mouth daily.  metoprolol tartrate 25 MG tablet Commonly known as: LOPRESSOR Take 25 mg by mouth every evening.   sevelamer carbonate 800 MG tablet Commonly known as: RENVELA Take 2,400 mg by mouth 3 (three) times daily with meals.        DISCHARGE INSTRUCTIONS:  1.  Follow-up with PCP in 5 days 2.  Follow-up with vascular surgery in 1 week DIET:  Cardiac diet and Renal diet DISCHARGE CONDITION:  Stable ACTIVITY:  Activity as tolerated OXYGEN:  Home Oxygen: No.  Oxygen Delivery: room air DISCHARGE LOCATION:   nursing home   If you experience worsening of your admission symptoms, develop shortness of breath, life threatening emergency, suicidal or homicidal thoughts you must seek medical attention immediately by calling 911 or calling your MD immediately  if symptoms less severe.  You Must read complete instructions/literature along with all the possible adverse reactions/side effects for all the Medicines you take and that have been prescribed to you. Take any new Medicines after you have completely understood and accpet all the possible adverse reactions/side effects.   Please note  You were cared for by a hospitalist during your hospital stay. If you have any questions about your discharge medications or the care you received while you were in the hospital after you are discharged, you can call the unit and asked to speak with the hospitalist on call if the hospitalist that took care of you is not available. Once you are discharged, your primary care physician will handle any further medical issues. Please note that NO REFILLS for any discharge medications will be authorized once you are discharged, as it is imperative that you return to your primary care physician (or establish a relationship with a primary care physician if you do not have one) for your aftercare needs so that they can reassess your need for medications and monitor your lab values.    On the day of Discharge:  VITAL SIGNS:  Blood pressure (!) 161/58, pulse 83, temperature 98.5 F (36.9 C), temperature source Oral, resp. rate 20, height 5\' 8"  (1.727 m), weight 65.7 kg, SpO2 95 %. PHYSICAL EXAMINATION:  GENERAL:68 y.o.-year-old patient lying in the bed with no acute distress.  EYES: Pupils equal, round, reactive to light and accommodation. No scleral icterus. Extraocular muscles intact.  HEENT: Head atraumatic, normocephalic. Oropharynx and nasopharynx clear. No oropharyngeal erythema, moist oral mucosa  NECK: Supple, no  jugular venous distention. No thyroid enlargement, no tenderness.  LUNGS: Normal breath sounds bilaterally, no wheezing, rales, rhonchi. No use of accessory muscles of respiration.  CARDIOVASCULAR: RRR, S1, S2 normal. No murmurs, rubs, or gallops.  ABDOMEN: Soft, nontender, nondistended. Bowel sounds present. No organomegaly or mass.  EXTREMITIES: Patient status post right AKA with dry dressing in place.  No edema on left.  NEUROLOGIC: Cranial nerves II through XII are intact. +global weakness PSYCHIATRIC: The patient is alert and oriented x 3. Good affect.  SKIN: Right stump dressing as above, no other rashes or lesions appreciated.  Skin on left lower extremity is dry DATA REVIEW:   CBC Recent Labs  Lab 10/30/18 1041  WBC 11.8*  HGB 7.8*  HCT 27.5*  PLT 348    Chemistries  Recent Labs  Lab 10/27/18 0835  10/30/18 1041  NA 139   < > 141  K 5.2*   < > 4.5  CL 96*   < > 97*  CO2 29   < > 28  GLUCOSE 65*   < > 71  BUN 35*   < >  30*  CREATININE 5.52*   < > 5.81*  CALCIUM 8.1*   < > 8.4*  MG 2.0  --   --    < > = values in this interval not displayed.     Microbiology Results  Results for orders placed or performed during the hospital encounter of 10/22/18  MRSA PCR Screening     Status: None   Collection Time: 10/22/18  2:16 PM   Specimen: Nasal Mucosa; Nasopharyngeal  Result Value Ref Range Status   MRSA by PCR NEGATIVE NEGATIVE Final    Comment:        The GeneXpert MRSA Assay (FDA approved for NASAL specimens only), is one component of a comprehensive MRSA colonization surveillance program. It is not intended to diagnose MRSA infection nor to guide or monitor treatment for MRSA infections. Performed at [facility], [address] [place], [place] [address]   Novel Coronavirus,NAA,(SEND-OUT TO REF LAB - TAT 24-48 hrs); Hosp Order     Status: None   Collection Time: 10/22/18  3:52 PM   Specimen: Nasopharyngeal Swab; Respiratory  Result Value Ref  Range Status   SARS-CoV-2, NAA NOT DETECTED NOT DETECTED Final    Comment: (NOTE) This test was developed and its performance characteristics determined by [employer]. This test has not been FDA cleared or approved. This test has been authorized by FDA under an Emergency Use Authorization (EUA). This test is only authorized for the duration of time the declaration that circumstances exist justifying the authorization of the emergency use of in vitro diagnostic tests for detection of SARS-CoV-2 virus and/or diagnosis of COVID-19 infection under section 564(b)(1) of the Act, 21 U.S.C. 283TDV-7(O)(1), unless the authorization is terminated or revoked sooner. When diagnostic testing is negative, the possibility of a false negative result should be considered in the context of a patient's recent exposures and the presence of clinical signs and symptoms consistent with COVID-19. An individual without symptoms of COVID-19 and who is not shedding SARS-CoV-2 virus would expect to have a negative (not detected) result in this assay. Performed  At: [facility] [address] [place], [place] [id number] Rush Farmer MD IR:[id number]    [place]  Final    Comment: Performed at [facility], [address] [place], [place] [address]  Blood Culture (routine x 2)     Status: None   Collection Time: 10/23/18  8:15 AM   Specimen: BLOOD  Result Value Ref Range Status   Specimen Description BLOOD BLOOD LEFT HAND  Final   Special Requests   Final    BOTTLES DRAWN AEROBIC AND ANAEROBIC Blood Culture adequate volume   Culture   Final    NO GROWTH 5 DAYS Performed at [facility], [address], [place], [place] [address]    Report Status 10/28/2018 FINAL  Final  Blood Culture (routine x 2)     Status: None   Collection Time: 10/23/18  8:32 AM   Specimen: BLOOD  Result Value Ref Range Status   Specimen Description BLOOD BLOOD RIGHT  WRIST  Final   Special Requests   Final    BOTTLES DRAWN AEROBIC AND ANAEROBIC Blood Culture adequate volume   Culture   Final    NO GROWTH 5 DAYS Performed at [facility], [address], [place], [place] [address]    Report Status 10/28/2018 FINAL  Final    RADIOLOGY:  No results found.   Management plans discussed with the patient, family and  they are in agreement.  CODE STATUS: Full Code   TOTAL TIME TAKING CARE OF THIS PATIENT: 45 minutes.    Berna Spare Emslee Lopezmartinez M.D on 10/30/2018 at 11:38 AM  Between 7am to 6pm - Pager - [phone]  After 6pm go to [email or web address] - Proofreader  [facility]  Office  [phone]  CC: Primary care physician; [name], MD   Note: This dictation was prepared with Dragon dictation along with smaller phrase technology. Any transcriptional errors that result from this process are unintentional.

## 2018-10-30 NOTE — Progress Notes (Signed)
[facility]  ROUNDING NOTE   Subjective:   Doing well today.  Much more alert and able to answer questions Denies any nausea or vomiting   Objective:  Vital signs in last 24 hours:  Temp:  [98 F (36.7 C)-99 F (37.2 C)] 99 F (37.2 C) (06/23 1611) Pulse Rate:  [70-97] 70 (06/23 1611) Resp:  [18-20] 18 (06/23 1259) BP: (95-161)/(55-60) 155/55 (06/23 1611) SpO2:  [93 %-100 %] 100 % (06/23 1611)  Weight change:  Filed Weights   10/27/18 0500 10/29/18 1020 10/29/18 1345  Weight: 70 kg 66.7 kg 65.7 kg    Intake/Output: I/O last 3 completed shifts: In: 76 [P.O.:60] Out: 1015 [Other:1015]   Intake/Output this shift:  Total I/O In: 140 [P.O.:140] Out: -   Physical Exam: General: NAD,   Head: Normocephalic, atraumatic. Moist oral mucosal membranes  Eyes: Anicteric,    Neck: Supple,    Lungs:  Clear to auscultation  Heart: Regular rate and rhythm  Abdomen:  Soft, nontender,   Extremities:  right above the knee amputation    Neurologic:  Resting quietly  Access: Right arm AVG    Basic Metabolic Panel: Recent Labs  Lab 10/25/18 0950 10/26/18 1010 10/27/18 0835 10/28/18 1101 10/29/18 0017 10/30/18 1041  NA 139 140 139 141 140 141  K 5.6* 5.7* 5.2* 4.6 6.3* 4.5  CL 97* 99 96* 98 99 97*  CO2 27 25 29 29 28 28   GLUCOSE 130* 92 65* 88 128* 71  BUN 42* 61* 35* 48* 54* 30*  CREATININE 6.46* 7.95* 5.52* 7.30* 8.35* 5.81*  CALCIUM 8.4* 8.1* 8.1* 8.1* 8.0* 8.4*  MG 2.0 2.0 2.0  --   --   --   PHOS  --  7.4*  --  5.3* 5.7* 5.0*    Liver Function Tests: Recent Labs  Lab 10/28/18 1101 10/29/18 0017 10/30/18 1041  ALBUMIN 2.4* 2.1* 2.5*   No results for input(s): LIPASE, AMYLASE in the last 168 hours. No results for input(s): AMMONIA in the last 168 hours.  CBC: Recent Labs  Lab 10/26/18 1010 10/28/18 1101 10/29/18 0017 10/29/18 1222 10/30/18 1041  WBC 21.3* 13.5* 15.3* 13.4* 11.8*  HGB 8.6* 8.2* 7.9* 7.4* 7.8*  HCT 28.8* 28.4* 26.3*  26.1* 27.5*  MCV 89.4 91.3 87.4 91.3 93.2  PLT 403* 345 389 363 348    Cardiac Enzymes: No results for input(s): CKTOTAL, CKMB, CKMBINDEX, TROPONINI in the last 168 hours.  BNP: Invalid input(s): POCBNP  CBG: No results for input(s): GLUCAP in the last 168 hours.  Microbiology: Results for orders placed or performed during the hospital encounter of 10/22/18  MRSA PCR Screening     Status: None   Collection Time: 10/22/18  2:16 PM   Specimen: Nasal Mucosa; Nasopharyngeal  Result Value Ref Range Status   MRSA by PCR NEGATIVE NEGATIVE Final    Comment:        The GeneXpert MRSA Assay (FDA approved for NASAL specimens only), is one component of a comprehensive MRSA colonization surveillance program. It is not intended to diagnose MRSA infection nor to guide or monitor treatment for MRSA infections. Performed at [facility], [address] [place], [place] [address]   Novel Coronavirus,NAA,(SEND-OUT TO REF LAB - TAT 24-48 hrs); Hosp Order     Status: None   Collection Time: 10/22/18  3:52 PM   Specimen: Nasopharyngeal Swab; Respiratory  Result Value Ref Range Status   SARS-CoV-2, NAA NOT DETECTED NOT DETECTED Final  Comment: (NOTE) This test was developed and its performance characteristics determined by [employer]. This test has not been FDA cleared or approved. This test has been authorized by FDA under an Emergency Use Authorization (EUA). This test is only authorized for the duration of time the declaration that circumstances exist justifying the authorization of the emergency use of in vitro diagnostic tests for detection of SARS-CoV-2 virus and/or diagnosis of COVID-19 infection under section 564(b)(1) of the Act, 21 U.S.C. 762UQJ-3(H)(5), unless the authorization is terminated or revoked sooner. When diagnostic testing is negative, the possibility of a false negative result should be considered in the context of a patient's recent exposures  and the presence of clinical signs and symptoms consistent with COVID-19. An individual without symptoms of COVID-19 and who is not shedding SARS-CoV-2 virus would expect to have a negative (not detected) result in this assay. Performed  At: [facility] [address] [place], [place] [id number] Rush Farmer MD HT:[id number]    [place]  Final    Comment: Performed at [facility], [address] [place], [place] [address]  Blood Culture (routine x 2)     Status: None   Collection Time: 10/23/18  8:15 AM   Specimen: BLOOD  Result Value Ref Range Status   Specimen Description BLOOD BLOOD LEFT HAND  Final   Special Requests   Final    BOTTLES DRAWN AEROBIC AND ANAEROBIC Blood Culture adequate volume   Culture   Final    NO GROWTH 5 DAYS Performed at [facility], [address] [place], [place] [address]    Report Status 10/28/2018 FINAL  Final  Blood Culture (routine x 2)     Status: None   Collection Time: 10/23/18  8:32 AM   Specimen: BLOOD  Result Value Ref Range Status   Specimen Description BLOOD BLOOD RIGHT WRIST  Final   Special Requests   Final    BOTTLES DRAWN AEROBIC AND ANAEROBIC Blood Culture adequate volume   Culture   Final    NO GROWTH 5 DAYS Performed at [facility], [address], [place], [place] [address]    Report Status 10/28/2018 FINAL  Final    Coagulation Studies: No results for input(s): LABPROT, INR in the last 72 hours.  Urinalysis: No results for input(s): COLORURINE, LABSPEC, PHURINE, GLUCOSEU, HGBUR, BILIRUBINUR, KETONESUR, PROTEINUR, UROBILINOGEN, NITRITE, LEUKOCYTESUR in the last 72 hours.  Invalid input(s): APPERANCEUR    Imaging: Dg Chest 1 View  Result Date: 10/29/2018 CLINICAL DATA:  68 year old male with leukocytosis. EXAM: CHEST  1 VIEW COMPARISON:  07/21/2018 and earlier. FINDINGS: Portable AP upright view at 0734 hours. Right chest dialysis type catheter has  been removed. New right thoracic inlet vascular stent. Chronic left subclavian and axillary vascular stents. Superimposed calcified atherosclerosis in both axilla. Lower lung volumes. Stable mild cardiomegaly and other mediastinal contours. Visualized tracheal air column is within normal limits. Mild veiling opacity at both lung bases and trace fluid in the right minor fissure. No pneumothorax. Stable pulmonary vascularity without overt edema. No air bronchograms. Negative visible bowel gas pattern and osseous structures. IMPRESSION: Lower lung volumes with small bilateral pleural effusions suspected. Stable pulmonary vascularity without overt edema. Electronically Signed   By: Genevie Ann M.D.   On: 10/29/2018 08:03     Medications:    . allopurinol  100 mg Oral Daily  . amLODipine  5 mg Oral Daily  . aspirin EC  81 mg Oral Daily  . atorvastatin  10 mg Oral Daily  . clopidogrel  75 mg Oral Daily  . epoetin (EPOGEN/PROCRIT) injection  10,000 Units Intravenous Q M,W,F-HD  . feeding supplement (NEPRO CARB STEADY)  237 mL Oral TID BM  . gabapentin  100 mg Oral BID  . heparin injection (subcutaneous)  5,000 Units Subcutaneous Q8H  . losartan  100 mg Oral Daily  . metoprolol tartrate  25 mg Oral BID  . multivitamin  1 tablet Oral QHS  . multivitamin-lutein  1 capsule Oral Daily  . polyethylene glycol  17 g Oral Daily  . sevelamer carbonate  2,400 mg Oral TID WC  . vitamin C  250 mg Oral BID   acetaminophen **OR** acetaminophen, albuterol, clonazePAM, hydrALAZINE, HYDROcodone-acetaminophen, HYDROmorphone (DILAUDID) injection, morphine injection, ondansetron **OR** ondansetron (ZOFRAN) IV  Assessment/ Plan:  [name] is a 68 y.o. black male with end stage renal disease on hemodialysis, hypertension, peripheral vascular disease, congestive heart failure  [address] MWF 66kg Right AVG   1. End Stage Renal Disease:   Complication with dialysis access, Status post angiogram  6/17 Dr. Lucky Cowboy with balloon angioplasty  - MWF schedule.   2.   Anemia of chronic kidney disease: Lab Results  Component Value Date   HGB 7.8 (L) 10/30/2018   - EPO with HD treatment  3. Secondary Hyperparathyroidism: Lab Results  Component Value Date   PTH 553 (H) 09/30/2015   CALCIUM 8.4 (L) 10/30/2018   PHOS 5.0 (H) 10/30/2018   - Continue sevelamer with meals.   4 Peripheral vascular disease with cellulitis.  status post RIGHT amputation (above the knee) 6/18 Dr. Lucky Cowboy    LOS: [place] 6/23/20204:17 PM

## 2018-10-30 NOTE — Anesthesia Postprocedure Evaluation (Signed)
Anesthesia Post Note  Patient: Johnny [name].  Procedure(s) Performed: AMPUTATION ABOVE KNEE (Right Leg Upper)  Patient location during evaluation: PACU Anesthesia Type: General Level of consciousness: awake and alert Pain management: pain level controlled Vital Signs Assessment: post-procedure vital signs reviewed and stable Respiratory status: spontaneous breathing, nonlabored ventilation and respiratory function stable Cardiovascular status: blood pressure returned to baseline and stable Postop Assessment: no apparent nausea or vomiting Anesthetic complications: no     Last Vitals:  Vitals:   10/30/18 1130 10/30/18 1259  BP: (!) 161/58 (!) 95/59  Pulse: 83 79  Resp:    Temp:  36.7 C  SpO2:  99%    Last Pain:  Vitals:   10/30/18 1259  TempSrc: Oral  PainSc:                  Alphonsus Sias

## 2018-10-30 NOTE — Progress Notes (Signed)
Report called to [facility] RN at [facility]. EMS called for transport

## 2018-10-30 NOTE — Progress Notes (Signed)
OT Cancellation Note  Patient Details Name: Johnny [name]. MRN: [id number] DOB: 11-02-1950   Cancelled Treatment:    Reason Eval/Treat Not Completed: Medical issues which prohibited therapy. Chart reviewed. Pt noted to have K+ of 6.3. Contraindicated for participation in therapy at this time. Will continue to follow and re-attempt at later date/time as medically appropriate.   Jeni Salles, MPH, MS, OTR/L ascom [phone] 10/30/18, 9:44 AM

## 2018-11-06 ENCOUNTER — Telehealth (INDEPENDENT_AMBULATORY_CARE_PROVIDER_SITE_OTHER): Payer: Self-pay

## 2018-11-06 NOTE — Telephone Encounter (Signed)
[facility] Therapy manager left a message per patient asking is there something they can use to cover the stump which still have staples in place.I spoke with Dr Lucky Cowboy and he recommended to use a dry dressing.I left a message on [facility] with medical recommendations

## 2018-11-12 ENCOUNTER — Ambulatory Visit (INDEPENDENT_AMBULATORY_CARE_PROVIDER_SITE_OTHER): Payer: Medicare Other | Admitting: Nurse Practitioner

## 2018-11-13 ENCOUNTER — Encounter (INDEPENDENT_AMBULATORY_CARE_PROVIDER_SITE_OTHER): Payer: Self-pay | Admitting: Nurse Practitioner

## 2018-11-13 ENCOUNTER — Other Ambulatory Visit: Payer: Self-pay

## 2018-11-13 ENCOUNTER — Ambulatory Visit (INDEPENDENT_AMBULATORY_CARE_PROVIDER_SITE_OTHER): Payer: Medicare Other | Admitting: Nurse Practitioner

## 2018-11-13 VITALS — BP 177/77 | HR 81 | Resp 10 | Ht 68.0 in | Wt 153.0 lb

## 2018-11-13 DIAGNOSIS — Z79899 Other long term (current) drug therapy: Secondary | ICD-10-CM

## 2018-11-13 DIAGNOSIS — N186 End stage renal disease: Secondary | ICD-10-CM

## 2018-11-13 DIAGNOSIS — Z89611 Acquired absence of right leg above knee: Secondary | ICD-10-CM

## 2018-11-13 DIAGNOSIS — I10 Essential (primary) hypertension: Secondary | ICD-10-CM

## 2018-11-13 DIAGNOSIS — Z992 Dependence on renal dialysis: Secondary | ICD-10-CM

## 2018-11-13 DIAGNOSIS — Z87891 Personal history of nicotine dependence: Secondary | ICD-10-CM

## 2018-11-13 DIAGNOSIS — I120 Hypertensive chronic kidney disease with stage 5 chronic kidney disease or end stage renal disease: Secondary | ICD-10-CM

## 2018-11-13 DIAGNOSIS — S78111A Complete traumatic amputation at level between right hip and knee, initial encounter: Secondary | ICD-10-CM

## 2018-11-13 NOTE — Progress Notes (Signed)
SUBJECTIVE:  Patient ID: Johnny [name]., male    DOB: 24-Dec-1950, 68 y.o.   MRN: [id number] Chief Complaint  Patient presents with  . Hospitalization Follow-up    HPI  Loye Vento. is a 68 y.o. 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  This amputation was due to extensive ulceration of his right lower extremity.  The patient states that he has adequately controlled pain at this time.  His wound is well approximated with little drainage.  No escar present.  He denies any fever, chills, nausea or vomiting.    Past Medical History:  Diagnosis Date  . Anemia   . Anxiety   . CHF (congestive heart failure) ([place])   . Chronic kidney disease    esrd dialysis m/w/f  . Gout   . Hyperlipidemia   . Hypertension   . Myocardial infarction ([place]) 2010   10 years ago  . 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  . Peripheral vascular disease [facility])     Past Surgical History:  Procedure Laterality Date  . A/V FISTULAGRAM Right 09/06/2018   Procedure: A/V FISTULAGRAM;  Surgeon: Algernon Huxley, MD;  Location: [place] [facility];  Service: Cardiovascular;  Laterality: Right;  . A/V SHUNTOGRAM Left 06/21/2017   Procedure: A/V SHUNTOGRAM;  Surgeon: Katha Cabal, MD;  Location: [place] [facility];  Service: Cardiovascular;  Laterality: Left;  . A/V SHUNTOGRAM N/A 10/24/2018   Procedure: A/V SHUNTOGRAM;  Surgeon: Algernon Huxley, MD;  Location: [place] [facility];  Service: Cardiovascular;  Laterality: N/A;  . AMPUTATION Right 10/25/2018   Procedure: AMPUTATION ABOVE KNEE;  Surgeon: Algernon Huxley, MD;  Location: [facility];  Service: General;  Laterality: Right;  . APPLICATION OF WOUND VAC Right 04/11/2018   Procedure: APPLICATION OF WOUND VAC;  Surgeon: Algernon Huxley, MD;  Location: [facility];  Service: Vascular;  Laterality: Right;  . AV FISTULA PLACEMENT Left 09/18/2015   Procedure: INSERTION OF ARTERIOVENOUS  (AV) GORE-TEX GRAFT ARM ( BRACH/AXILLARY GRAFT W/ INSTANT STICK GRAFT );  Surgeon: Katha Cabal, MD;  Location: [facility];  Service: Vascular;  Laterality: Left;  . AV FISTULA PLACEMENT Right 07/19/2018   Procedure: INSERTION OF ARTERIOVENOUS (AV) GORE-TEX GRAFT ARM ( BRACHIAL AXILLARY);  Surgeon: Algernon Huxley, MD;  Location: [facility];  Service: Vascular;  Laterality: Right;  . DIALYSIS FISTULA CREATION Right 10/2017   right chest perm cath  . DIALYSIS/PERMA CATHETER REMOVAL N/A 09/13/2018   Procedure: DIALYSIS/PERMA CATHETER REMOVAL;  Surgeon: Algernon Huxley, MD;  Location: [place] [facility];  Service: Cardiovascular;  Laterality: N/A;  . ESOPHAGOGASTRODUODENOSCOPY N/A 12/19/2017   Procedure: ESOPHAGOGASTRODUODENOSCOPY (EGD);  Surgeon: Lin Landsman, MD;  Location: [facility];  Service: Gastroenterology;  Laterality: N/A;  . LOWER EXTREMITY ANGIOGRAPHY Left 11/16/2017   Procedure: LOWER EXTREMITY ANGIOGRAPHY;  Surgeon: Algernon Huxley, MD;  Location: [place] [facility];  Service: Cardiovascular;  Laterality: Left;  . LOWER EXTREMITY ANGIOGRAPHY Right 01/18/2018   Procedure: LOWER EXTREMITY ANGIOGRAPHY;  Surgeon: Algernon Huxley, MD;  Location: [place] [facility];  Service: Cardiovascular;  Laterality: Right;  . LOWER EXTREMITY ANGIOGRAPHY Left 04/02/2018   Procedure: LOWER EXTREMITY ANGIOGRAPHY;  Surgeon: Algernon Huxley, MD;  Location: [place] [facility];  Service: Cardiovascular;  Laterality: Left;  . LOWER EXTREMITY ANGIOGRAPHY Right 04/09/2018   Procedure: Lower Extremity Angiography with possible intervention;  Surgeon: Algernon Huxley, MD;  Location: [place] [facility];  Service: Cardiovascular;  Laterality: Right;  . LOWER EXTREMITY ANGIOGRAPHY Right 07/23/2018   Procedure: Lower Extremity Angiography;  Surgeon: Algernon Huxley, MD;  Location: [place] [facility];  Service: Cardiovascular;  Laterality: Right;  . LOWER EXTREMITY ANGIOGRAPHY Right 09/13/2018   Procedure: LOWER EXTREMITY  ANGIOGRAPHY;  Surgeon: Algernon Huxley, MD;  Location: [place] [facility];  Service: Cardiovascular;  Laterality: Right;  . LOWER EXTREMITY VENOGRAPHY Right 09/13/2018   Procedure: LOWER EXTREMITY VENOGRAPHY;  Surgeon: Algernon Huxley, MD;  Location: [place] [facility];  Service: Cardiovascular;  Laterality: Right;  . PERIPHERAL VASCULAR CATHETERIZATION Left 09/01/2015   Procedure: A/V Shuntogram/Fistulagram;  Surgeon: Katha Cabal, MD;  Location: [place] [facility];  Service: Cardiovascular;  Laterality: Left;  . PERIPHERAL VASCULAR CATHETERIZATION N/A 09/30/2015   Procedure: A/V Shuntogram/Fistulagram with perm cathether removal;  Surgeon: Algernon Huxley, MD;  Location: [place] [facility];  Service: Cardiovascular;  Laterality: N/A;  . PERIPHERAL VASCULAR CATHETERIZATION Left 09/30/2015   Procedure: A/V Shunt Intervention;  Surgeon: Algernon Huxley, MD;  Location: [place] [facility];  Service: Cardiovascular;  Laterality: Left;  . PERIPHERAL VASCULAR CATHETERIZATION Left 12/03/2015   Procedure: Thrombectomy;  Surgeon: Algernon Huxley, MD;  Location: [place] [facility];  Service: Cardiovascular;  Laterality: Left;  . PERIPHERAL VASCULAR CATHETERIZATION Left 01/28/2016   Procedure: Thrombectomy;  Surgeon: Algernon Huxley, MD;  Location: [place] [facility];  Service: Cardiovascular;  Laterality: Left;  . PERIPHERAL VASCULAR CATHETERIZATION N/A 01/28/2016   Procedure: A/V Shuntogram/Fistulagram;  Surgeon: Algernon Huxley, MD;  Location: [place] [facility];  Service: Cardiovascular;  Laterality: N/A;  . SKIN SPLIT GRAFT Right 05/24/2018   Procedure: SKIN GRAFT SPLIT THICKNESS ( RIGHT CALF);  Surgeon: Algernon Huxley, MD;  Location: [facility];  Service: Vascular;  Laterality: Right;  . UPPER EXTREMITY ANGIOGRAPHY  10/24/2018   Procedure: Upper Extremity Angiography;  Surgeon: Algernon Huxley, MD;  Location: [place] [facility];  Service: Cardiovascular;;  . WOUND DEBRIDEMENT Right 04/11/2018   Procedure: DEBRIDEMENT  WOUND calf muscle and skin;  Surgeon: Algernon Huxley, MD;  Location: [facility];  Service: Vascular;  Laterality: Right;    Social History   Socioeconomic History  . Marital status: Single    Spouse name: Not on file  . Number of children: Not on file  . Years of education: Not on file  . Highest education level: Not on file  Occupational History  . Occupation: retired    Comment: Medical laboratory scientific officer  Social Needs  . Financial resource strain: Patient refused  . Food insecurity    Worry: Patient refused    Inability: Patient refused  . Transportation needs    Medical: Patient refused    Non-medical: Patient refused  Tobacco Use  . Smoking status: Former Smoker    Types: Cigarettes    Quit date: 05/17/2005    Years since quitting: 13.5  . Smokeless tobacco: Never Used  Substance and Sexual Activity  . Alcohol use: No  . Drug use: No  . Sexual activity: Not Currently  Lifestyle  . Physical activity    Days per week: Patient refused    Minutes per session: Patient refused  . Stress: Not on file  Relationships  . Social connections    Talks on phone: Patient refused    Gets together: Patient refused    Attends religious service: Patient refused    Active member  of club or organization: Patient refused    Attends meetings of clubs or organizations: Patient refused    Relationship status: Patient refused  . Intimate partner violence    Fear of current or ex partner: Patient refused    Emotionally abused: Patient refused    Physically abused: Patient refused    Forced sexual activity: Patient refused  Other Topics Concern  . Not on file  Social History Narrative  . Not on file    Family History  Problem Relation Age of Onset  . Hypertension Other   . Heart disease Other   . Diabetes Mother     Allergies  Allergen Reactions  . Shellfish Allergy Anaphylaxis     Review of Systems   Review of Systems: Negative Unless Checked Constitutional: [] Weight loss   [] Fever  [] Chills Cardiac: [] Chest pain   []  Atrial Fibrillation  [] Palpitations   [] Shortness of breath when laying flat   [] Shortness of breath with exertion. [] Shortness of breath at rest Vascular:  [] Pain in legs with walking   [] Pain in legs with standing [] Pain in legs when laying flat   [] Claudication    [] Pain in feet when laying flat    [] History of DVT   [] Phlebitis   [] Swelling in legs   [] Varicose veins   [] Non-healing ulcers Pulmonary:   [] Uses home oxygen   [] Productive cough   [] Hemoptysis   [] Wheeze  [] COPD   [] Asthma Neurologic:  [] Dizziness   [] Seizures  [] Blackouts [] History of stroke   [] History of TIA  [] Aphasia   [] Temporary Blindness   [] Weakness or numbness in arm   [x] Weakness or numbness in leg Musculoskeletal:   [] Joint swelling   [] Joint pain   [] Low back pain  []  History of Knee Replacement [] Arthritis [] back Surgeries  []  Spinal Stenosis    Hematologic:  [] Easy bruising  [] Easy bleeding   [] Hypercoagulable state   [x] Anemic Gastrointestinal:  [] Diarrhea   [] Vomiting  [] Gastroesophageal reflux/heartburn   [] Difficulty swallowing. [] Abdominal pain Genitourinary:  [x] Chronic kidney disease   [] Difficult urination  [] Anuric   [] Blood in urine [] Frequent urination  [] Burning with urination   [] Hematuria Skin:  [] Rashes   [] Ulcers [x] Wounds Psychological:  [] History of anxiety   []  History of major depression  []  Memory Difficulties      OBJECTIVE:   Physical Exam  BP (!) 177/77 (BP Location: Left Arm, Patient Position: Sitting, Cuff Size: Large)   Pulse 81   Resp 10   Ht 5\' 8"  (1.727 m)   Wt 153 lb (69.4 kg)   BMI 23.26 kg/m   Gen: WD/WN, NAD Head: [place]/AT, No temporalis wasting.  Ear/Nose/Throat: Hearing grossly intact, nares w/o erythema or drainage Eyes: PER, EOMI, sclera nonicteric.  Neck: Supple, no masses.  No JVD.  Pulmonary:  Good air movement, no use of accessory muscles.  Cardiac: RRR Vascular: well approximated wound of r aka Vessel Right Left   Radial Palpable Palpable   Gastrointestinal: soft, non-distended. No guarding/no peritoneal signs.  Musculoskeletal: M/S 5/5 throughout. R AKA Neurologic: Pain and light touch intact in extremities.  Symmetrical.  Speech is fluent. Motor exam as listed above. Psychiatric: Judgment intact, Mood & affect appropriate for pt's clinical situation. Dermatologic: No Venous rashes. No Ulcers Noted.  No changes consistent with cellulitis.  Lymph : No Cervical lymphadenopathy, no lichenification or skin changes of chronic lymphedema.       ASSESSMENT AND PLAN:  1. Above knee amputation of right lower extremity (HCC) Wound appears to be healing  well.  Typically staple removal is done at 3-4 weeks, so we will have the patient return to begin staple removal in 1-2 weeks.   2. Hypertension, unspecified type Continue antihypertensive medications as already ordered, these medications have been reviewed and there are no changes at this time.   3. ESRD on dialysis [facility]) Currently doing well with no issue.  Will continue on regular follow up schedule.    Current Outpatient Medications on File Prior to Visit  Medication Sig Dispense Refill  . allopurinol (ZYLOPRIM) 100 MG tablet Take 100 mg by mouth daily.    [name] amLODipine (NORVASC) 5 MG tablet Take 1 tablet (5 mg total) by mouth daily. 30 tablet 0  . aspirin EC 81 MG EC tablet Take 1 tablet (81 mg total) by mouth daily. 30 tablet 0  . atorvastatin (LIPITOR) 10 MG tablet Take 1 tablet by mouth daily.    . clopidogrel (PLAVIX) 75 MG tablet Take 1 tablet (75 mg total) by mouth daily. 30 tablet 11  . folic acid-vitamin b complex-vitamin c-selenium-zinc (DIALYVITE) 3 MG TABS tablet Take 1 tablet by mouth daily.     [name] gabapentin (NEURONTIN) 100 MG capsule Take 1 capsule (100 mg total) by mouth at bedtime. (Patient taking differently: Take 100 mg by mouth 2 (two) times daily. )    . HYDROcodone-acetaminophen (NORCO) 5-325 MG tablet Take 1 tablet by mouth  every 6 (six) hours as needed for moderate pain. 20 tablet 0  . losartan (COZAAR) 100 MG tablet Take 100 mg by mouth daily.    . metoprolol tartrate (LOPRESSOR) 25 MG tablet Take 25 mg by mouth every evening.    . Multiple Vitamin (MULTIVITAMIN) LIQD Take 5 mLs by mouth daily.    . sevelamer carbonate (RENVELA) 800 MG tablet Take 2,400 mg by mouth 3 (three) times daily with meals.     . clonazePAM (KLONOPIN) 0.5 MG tablet Take 1 tablet (0.5 mg total) by mouth as needed. (Patient not taking: Reported on 11/13/2018) 10 tablet 0   No current facility-administered medications on file prior to visit.     There are no Patient Instructions on file for this visit. No follow-ups on file.   Kris Hartmann, NP  This note was completed with Sales executive.  Any errors are purely unintentional.

## 2018-11-16 ENCOUNTER — Encounter (INDEPENDENT_AMBULATORY_CARE_PROVIDER_SITE_OTHER): Payer: Self-pay | Admitting: Nurse Practitioner

## 2018-11-27 ENCOUNTER — Ambulatory Visit (INDEPENDENT_AMBULATORY_CARE_PROVIDER_SITE_OTHER): Payer: Medicare Other | Admitting: Vascular Surgery

## 2018-11-27 ENCOUNTER — Encounter (INDEPENDENT_AMBULATORY_CARE_PROVIDER_SITE_OTHER): Payer: Self-pay | Admitting: Vascular Surgery

## 2018-11-27 ENCOUNTER — Other Ambulatory Visit: Payer: Self-pay

## 2018-11-27 VITALS — BP 190/64 | HR 76 | Resp 16

## 2018-11-27 DIAGNOSIS — Z89611 Acquired absence of right leg above knee: Secondary | ICD-10-CM

## 2018-11-27 DIAGNOSIS — E1169 Type 2 diabetes mellitus with other specified complication: Secondary | ICD-10-CM

## 2018-11-27 DIAGNOSIS — S78111A Complete traumatic amputation at level between right hip and knee, initial encounter: Secondary | ICD-10-CM

## 2018-11-27 DIAGNOSIS — Z79899 Other long term (current) drug therapy: Secondary | ICD-10-CM

## 2018-11-27 DIAGNOSIS — N186 End stage renal disease: Secondary | ICD-10-CM

## 2018-11-27 DIAGNOSIS — Z992 Dependence on renal dialysis: Secondary | ICD-10-CM

## 2018-11-27 DIAGNOSIS — E1122 Type 2 diabetes mellitus with diabetic chronic kidney disease: Secondary | ICD-10-CM

## 2018-11-27 DIAGNOSIS — I120 Hypertensive chronic kidney disease with stage 5 chronic kidney disease or end stage renal disease: Secondary | ICD-10-CM

## 2018-11-27 DIAGNOSIS — I10 Essential (primary) hypertension: Secondary | ICD-10-CM

## 2018-11-27 NOTE — Assessment & Plan Note (Signed)
blood pressure control important in reducing the progression of atherosclerotic disease. On appropriate oral medications.  

## 2018-11-27 NOTE — Assessment & Plan Note (Signed)
blood glucose control important in reducing the progression of atherosclerotic disease. Also, involved in wound healing. On appropriate medications.  

## 2018-11-27 NOTE — Assessment & Plan Note (Signed)
Half the staples were removed today and the wound looks good.  Return to clinic in about 2 weeks to remove the remainder of the staples.

## 2018-11-27 NOTE — Assessment & Plan Note (Signed)
Access is working well after central venous intervention.  Arm is much less swollen.  He may have a mild steal syndrome and we discussed trying to increase the activity in that arm to increase the perfusion.  Continue to use the access

## 2018-11-27 NOTE — Progress Notes (Signed)
Patient ID: Johnny Loren., male   DOB: 11/09/1950, 68 y.o.   MRN: [id number]  Chief Complaint  Patient presents with  . Follow-up    HPI Johnny Demmer. is a 68 y.o. male.  Patient returns about 4 weeks status post right above-knee amputation.  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  His right arm is markedly less swollen.  He does have some pain in his hand and there may be some component of a mild steal syndrome but his access is working well.  His AKA is healing well.  His pain is much improved and he looks better after getting his right leg off for chronic wounds and infection.  We removed about half the staples on the right leg today and will remove the others in a couple of weeks.   Past Medical History:  Diagnosis Date  . Anemia   . Anxiety   . CHF (congestive heart failure) ([place])   . Chronic kidney disease    esrd dialysis m/w/f  . Gout   . Hyperlipidemia   . Hypertension   . Myocardial infarction ([place]) 2010   10 years ago  . 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  . Peripheral vascular disease [facility])     Past Surgical History:  Procedure Laterality Date  . A/V FISTULAGRAM Right 09/06/2018   Procedure: A/V FISTULAGRAM;  Surgeon: Algernon Huxley, MD;  Location: [place] [facility];  Service: Cardiovascular;  Laterality: Right;  . A/V SHUNTOGRAM Left 06/21/2017   Procedure: A/V SHUNTOGRAM;  Surgeon: Katha Cabal, MD;  Location: [place] [facility];  Service: Cardiovascular;  Laterality: Left;  . A/V SHUNTOGRAM N/A 10/24/2018   Procedure: A/V SHUNTOGRAM;  Surgeon: Algernon Huxley, MD;  Location: [place] [facility];  Service: Cardiovascular;  Laterality: N/A;  . AMPUTATION Right 10/25/2018   Procedure: AMPUTATION ABOVE KNEE;  Surgeon: Algernon Huxley, MD;  Location: [facility];  Service: General;  Laterality: Right;  . APPLICATION OF WOUND VAC Right 04/11/2018   Procedure: APPLICATION  OF WOUND VAC;  Surgeon: Algernon Huxley, MD;  Location: [facility];  Service: Vascular;  Laterality: Right;  . AV FISTULA PLACEMENT Left 09/18/2015   Procedure: INSERTION OF ARTERIOVENOUS (AV) GORE-TEX GRAFT ARM ( BRACH/AXILLARY GRAFT W/ INSTANT STICK GRAFT );  Surgeon: Katha Cabal, MD;  Location: [facility];  Service: Vascular;  Laterality: Left;  . AV FISTULA PLACEMENT Right 07/19/2018   Procedure: INSERTION OF ARTERIOVENOUS (AV) GORE-TEX GRAFT ARM ( BRACHIAL AXILLARY);  Surgeon: Algernon Huxley, MD;  Location: [facility];  Service: Vascular;  Laterality: Right;  . DIALYSIS FISTULA CREATION Right 10/2017   right chest perm cath  . DIALYSIS/PERMA CATHETER REMOVAL N/A 09/13/2018   Procedure: DIALYSIS/PERMA CATHETER REMOVAL;  Surgeon: Algernon Huxley, MD;  Location: [place] [facility];  Service: Cardiovascular;  Laterality: N/A;  . ESOPHAGOGASTRODUODENOSCOPY N/A 12/19/2017   Procedure: ESOPHAGOGASTRODUODENOSCOPY (EGD);  Surgeon: Lin Landsman, MD;  Location: [facility] END[facility]COPY;  Service: Gastroenterology;  Laterality: N/A;  . LOWER EXTREMITY ANGIOGRAPHY Left 11/16/2017   Procedure: LOWER EXTREMITY ANGIOGRAPHY;  Surgeon: Algernon Huxley, MD;  Location: [place] [facility];  Service: Cardiovascular;  Laterality: Left;  . LOWER EXTREMITY ANGIOGRAPHY Right 01/18/2018   Procedure: LOWER EXTREMITY ANGIOGRAPHY;  Surgeon: Algernon Huxley, MD;  Location: [place] [facility];  Service: Cardiovascular;  Laterality: Right;  . LOWER EXTREMITY ANGIOGRAPHY Left 04/02/2018  Procedure: LOWER EXTREMITY ANGIOGRAPHY;  Surgeon: Algernon Huxley, MD;  Location: [place] [facility];  Service: Cardiovascular;  Laterality: Left;  . LOWER EXTREMITY ANGIOGRAPHY Right 04/09/2018   Procedure: Lower Extremity Angiography with possible intervention;  Surgeon: Algernon Huxley, MD;  Location: [place] [facility];  Service: Cardiovascular;  Laterality: Right;  . LOWER EXTREMITY ANGIOGRAPHY Right 07/23/2018   Procedure: Lower Extremity Angiography;   Surgeon: Algernon Huxley, MD;  Location: [place] [facility];  Service: Cardiovascular;  Laterality: Right;  . LOWER EXTREMITY ANGIOGRAPHY Right 09/13/2018   Procedure: LOWER EXTREMITY ANGIOGRAPHY;  Surgeon: Algernon Huxley, MD;  Location: [place] [facility];  Service: Cardiovascular;  Laterality: Right;  . LOWER EXTREMITY VENOGRAPHY Right 09/13/2018   Procedure: LOWER EXTREMITY VENOGRAPHY;  Surgeon: Algernon Huxley, MD;  Location: [place] [facility];  Service: Cardiovascular;  Laterality: Right;  . PERIPHERAL VASCULAR CATHETERIZATION Left 09/01/2015   Procedure: A/V Shuntogram/Fistulagram;  Surgeon: Katha Cabal, MD;  Location: [place] [facility];  Service: Cardiovascular;  Laterality: Left;  . PERIPHERAL VASCULAR CATHETERIZATION N/A 09/30/2015   Procedure: A/V Shuntogram/Fistulagram with perm cathether removal;  Surgeon: Algernon Huxley, MD;  Location: [place] [facility];  Service: Cardiovascular;  Laterality: N/A;  . PERIPHERAL VASCULAR CATHETERIZATION Left 09/30/2015   Procedure: A/V Shunt Intervention;  Surgeon: Algernon Huxley, MD;  Location: [place] [facility];  Service: Cardiovascular;  Laterality: Left;  . PERIPHERAL VASCULAR CATHETERIZATION Left 12/03/2015   Procedure: Thrombectomy;  Surgeon: Algernon Huxley, MD;  Location: [place] [facility];  Service: Cardiovascular;  Laterality: Left;  . PERIPHERAL VASCULAR CATHETERIZATION Left 01/28/2016   Procedure: Thrombectomy;  Surgeon: Algernon Huxley, MD;  Location: [place] [facility];  Service: Cardiovascular;  Laterality: Left;  . PERIPHERAL VASCULAR CATHETERIZATION N/A 01/28/2016   Procedure: A/V Shuntogram/Fistulagram;  Surgeon: Algernon Huxley, MD;  Location: [place] [facility];  Service: Cardiovascular;  Laterality: N/A;  . SKIN SPLIT GRAFT Right 05/24/2018   Procedure: SKIN GRAFT SPLIT THICKNESS ( RIGHT CALF);  Surgeon: Algernon Huxley, MD;  Location: [facility];  Service: Vascular;  Laterality: Right;  . UPPER EXTREMITY ANGIOGRAPHY  10/24/2018   Procedure:  Upper Extremity Angiography;  Surgeon: Algernon Huxley, MD;  Location: [place] [facility];  Service: Cardiovascular;;  . WOUND DEBRIDEMENT Right 04/11/2018   Procedure: DEBRIDEMENT WOUND calf muscle and skin;  Surgeon: Algernon Huxley, MD;  Location: [facility];  Service: Vascular;  Laterality: Right;      Allergies  Allergen Reactions  . Shellfish Allergy Anaphylaxis    Current Outpatient Medications  Medication Sig Dispense Refill  . allopurinol (ZYLOPRIM) 100 MG tablet Take 100 mg by mouth daily.    [name] amLODipine (NORVASC) 5 MG tablet Take 1 tablet (5 mg total) by mouth daily. 30 tablet 0  . aspirin EC 81 MG EC tablet Take 1 tablet (81 mg total) by mouth daily. 30 tablet 0  . atorvastatin (LIPITOR) 10 MG tablet Take 1 tablet by mouth daily.    . clopidogrel (PLAVIX) 75 MG tablet Take 1 tablet (75 mg total) by mouth daily. 30 tablet 11  . folic acid-vitamin b complex-vitamin c-selenium-zinc (DIALYVITE) 3 MG TABS tablet Take 1 tablet by mouth daily.     [name] gabapentin (NEURONTIN) 100 MG capsule Take 1 capsule (100 mg total) by mouth at bedtime. (Patient taking differently: Take 100 mg by mouth 2 (two) times daily. )    . HYDROcodone-acetaminophen (NORCO) 5-325 MG tablet Take  1 tablet by mouth every 6 (six) hours as needed for moderate pain. 20 tablet 0  . losartan (COZAAR) 100 MG tablet Take 100 mg by mouth daily.    . metoprolol tartrate (LOPRESSOR) 25 MG tablet Take 25 mg by mouth every evening.    . Multiple Vitamin (MULTIVITAMIN) LIQD Take 5 mLs by mouth daily.    . sevelamer carbonate (RENVELA) 800 MG tablet Take 2,400 mg by mouth 3 (three) times daily with meals.     . clonazePAM (KLONOPIN) 0.5 MG tablet Take 1 tablet (0.5 mg total) by mouth as needed. (Patient not taking: Reported on 11/13/2018) 10 tablet 0   No current facility-administered medications for this visit.         Physical Exam BP (!) 190/64 (BP Location: Left Arm)   Pulse 76   Resp 16  Gen:  WD/WN, NAD Skin: incision  C/D/I     Assessment/Plan:  HTN (hypertension) blood pressure control important in reducing the progression of atherosclerotic disease. On appropriate oral medications.   Diabetes ([place]) blood glucose control important in reducing the progression of atherosclerotic disease. Also, involved in wound healing. On appropriate medications.   ESRD on dialysis [facility]) Access is working well after central venous intervention.  Arm is much less swollen.  He may have a mild steal syndrome and we discussed trying to increase the activity in that arm to increase the perfusion.  Continue to use the access  Above knee amputation of right lower extremity ([place]) Half the staples were removed today and the wound looks good.  Return to clinic in about 2 weeks to remove the remainder of the staples.      Leotis Pain 11/27/2018, 9:18 AM   This note was created with Dragon medical transcription system.  Any errors from dictation are unintentional.

## 2018-12-04 ENCOUNTER — Ambulatory Visit (INDEPENDENT_AMBULATORY_CARE_PROVIDER_SITE_OTHER): Payer: Medicare Other | Admitting: Vascular Surgery

## 2018-12-11 ENCOUNTER — Encounter (INDEPENDENT_AMBULATORY_CARE_PROVIDER_SITE_OTHER): Payer: Self-pay | Admitting: Nurse Practitioner

## 2018-12-11 ENCOUNTER — Other Ambulatory Visit: Payer: Self-pay

## 2018-12-11 ENCOUNTER — Encounter (INDEPENDENT_AMBULATORY_CARE_PROVIDER_SITE_OTHER): Payer: Self-pay

## 2018-12-11 ENCOUNTER — Ambulatory Visit (INDEPENDENT_AMBULATORY_CARE_PROVIDER_SITE_OTHER): Payer: Medicare Other | Admitting: Nurse Practitioner

## 2018-12-11 VITALS — BP 103/63 | HR 66 | Resp 16

## 2018-12-11 DIAGNOSIS — S78111A Complete traumatic amputation at level between right hip and knee, initial encounter: Secondary | ICD-10-CM

## 2018-12-11 DIAGNOSIS — I10 Essential (primary) hypertension: Secondary | ICD-10-CM

## 2018-12-11 DIAGNOSIS — E785 Hyperlipidemia, unspecified: Secondary | ICD-10-CM

## 2018-12-11 NOTE — Progress Notes (Signed)
SUBJECTIVE:  Patient ID: Johnny Bucco., male    DOB: Aug 25, 1950, 68 y.o.   MRN: [id number] Chief Complaint  Patient presents with  . Follow-up    HPI  Johnny Crabbe. is a 68 y.o. male the presents today for staple removal.  The patient states he has had some issues with phantom limb pain however current medication is able to control it.  He denies any drainage from his wound.  He denies any fever, chills, nausea vomiting or diarrhea.  The wound is mostly well approximated except for the medial portion.  This portion is not smooth with rest of the wound and there is still some portions of the wound that are approximating, however there is no area of dehiscence.  Reports no issues with his dialysis access at this time.  Past Medical History:  Diagnosis Date  . Anemia   . Anxiety   . CHF (congestive heart failure) ([place])   . Chronic kidney disease    esrd dialysis m/w/f  . Gout   . Hyperlipidemia   . Hypertension   . Myocardial infarction ([place]) 2010   10 years ago  . 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  . Peripheral vascular disease [facility])     Past Surgical History:  Procedure Laterality Date  . A/V FISTULAGRAM Right 09/06/2018   Procedure: A/V FISTULAGRAM;  Surgeon: Algernon Huxley, MD;  Location: [place] [facility];  Service: Cardiovascular;  Laterality: Right;  . A/V SHUNTOGRAM Left 06/21/2017   Procedure: A/V SHUNTOGRAM;  Surgeon: Katha Cabal, MD;  Location: [place] [facility];  Service: Cardiovascular;  Laterality: Left;  . A/V SHUNTOGRAM N/A 10/24/2018   Procedure: A/V SHUNTOGRAM;  Surgeon: Algernon Huxley, MD;  Location: [place] [facility];  Service: Cardiovascular;  Laterality: N/A;  . AMPUTATION Right 10/25/2018   Procedure: AMPUTATION ABOVE KNEE;  Surgeon: Algernon Huxley, MD;  Location: [facility];  Service: General;  Laterality: Right;  . APPLICATION OF WOUND VAC Right 04/11/2018   Procedure: APPLICATION OF WOUND VAC;   Surgeon: Algernon Huxley, MD;  Location: [facility];  Service: Vascular;  Laterality: Right;  . AV FISTULA PLACEMENT Left 09/18/2015   Procedure: INSERTION OF ARTERIOVENOUS (AV) GORE-TEX GRAFT ARM ( BRACH/AXILLARY GRAFT W/ INSTANT STICK GRAFT );  Surgeon: Katha Cabal, MD;  Location: [facility];  Service: Vascular;  Laterality: Left;  . AV FISTULA PLACEMENT Right 07/19/2018   Procedure: INSERTION OF ARTERIOVENOUS (AV) GORE-TEX GRAFT ARM ( BRACHIAL AXILLARY);  Surgeon: Algernon Huxley, MD;  Location: [facility];  Service: Vascular;  Laterality: Right;  . DIALYSIS FISTULA CREATION Right 10/2017   right chest perm cath  . DIALYSIS/PERMA CATHETER REMOVAL N/A 09/13/2018   Procedure: DIALYSIS/PERMA CATHETER REMOVAL;  Surgeon: Algernon Huxley, MD;  Location: [place] [facility];  Service: Cardiovascular;  Laterality: N/A;  . ESOPHAGOGASTRODUODENOSCOPY N/A 12/19/2017   Procedure: ESOPHAGOGASTRODUODENOSCOPY (EGD);  Surgeon: Lin Landsman, MD;  Location: [facility] ENDOSCOPY;  Service: Gastroenterology;  Laterality: N/A;  . LOWER EXTREMITY ANGIOGRAPHY Left 11/16/2017   Procedure: LOWER EXTREMITY ANGIOGRAPHY;  Surgeon: Algernon Huxley, MD;  Location: [place] [facility];  Service: Cardiovascular;  Laterality: Left;  . LOWER EXTREMITY ANGIOGRAPHY Right 01/18/2018   Procedure: LOWER EXTREMITY ANGIOGRAPHY;  Surgeon: Algernon Huxley, MD;  Location: [place] [facility];  Service: Cardiovascular;  Laterality: Right;  . LOWER EXTREMITY ANGIOGRAPHY Left 04/02/2018   Procedure: LOWER EXTREMITY ANGIOGRAPHY;  Surgeon: Algernon Huxley,  MD;  Location: [place] [facility];  Service: Cardiovascular;  Laterality: Left;  . LOWER EXTREMITY ANGIOGRAPHY Right 04/09/2018   Procedure: Lower Extremity Angiography with possible intervention;  Surgeon: Algernon Huxley, MD;  Location: [place] [facility];  Service: Cardiovascular;  Laterality: Right;  . LOWER EXTREMITY ANGIOGRAPHY Right 07/23/2018   Procedure: Lower Extremity Angiography;  Surgeon: Algernon Huxley, MD;  Location: [place] [facility];  Service: Cardiovascular;  Laterality: Right;  . LOWER EXTREMITY ANGIOGRAPHY Right 09/13/2018   Procedure: LOWER EXTREMITY ANGIOGRAPHY;  Surgeon: Algernon Huxley, MD;  Location: [place] [facility];  Service: Cardiovascular;  Laterality: Right;  . LOWER EXTREMITY VENOGRAPHY Right 09/13/2018   Procedure: LOWER EXTREMITY VENOGRAPHY;  Surgeon: Algernon Huxley, MD;  Location: [place] [facility];  Service: Cardiovascular;  Laterality: Right;  . PERIPHERAL VASCULAR CATHETERIZATION Left 09/01/2015   Procedure: A/V Shuntogram/Fistulagram;  Surgeon: Katha Cabal, MD;  Location: [place] [facility];  Service: Cardiovascular;  Laterality: Left;  . PERIPHERAL VASCULAR CATHETERIZATION N/A 09/30/2015   Procedure: A/V Shuntogram/Fistulagram with perm cathether removal;  Surgeon: Algernon Huxley, MD;  Location: [place] [facility];  Service: Cardiovascular;  Laterality: N/A;  . PERIPHERAL VASCULAR CATHETERIZATION Left 09/30/2015   Procedure: A/V Shunt Intervention;  Surgeon: Algernon Huxley, MD;  Location: [place] [facility];  Service: Cardiovascular;  Laterality: Left;  . PERIPHERAL VASCULAR CATHETERIZATION Left 12/03/2015   Procedure: Thrombectomy;  Surgeon: Algernon Huxley, MD;  Location: [place] [facility];  Service: Cardiovascular;  Laterality: Left;  . PERIPHERAL VASCULAR CATHETERIZATION Left 01/28/2016   Procedure: Thrombectomy;  Surgeon: Algernon Huxley, MD;  Location: [place] [facility];  Service: Cardiovascular;  Laterality: Left;  . PERIPHERAL VASCULAR CATHETERIZATION N/A 01/28/2016   Procedure: A/V Shuntogram/Fistulagram;  Surgeon: Algernon Huxley, MD;  Location: [place] [facility];  Service: Cardiovascular;  Laterality: N/A;  . SKIN SPLIT GRAFT Right 05/24/2018   Procedure: SKIN GRAFT SPLIT THICKNESS ( RIGHT CALF);  Surgeon: Algernon Huxley, MD;  Location: [facility];  Service: Vascular;  Laterality: Right;  . UPPER EXTREMITY ANGIOGRAPHY  10/24/2018   Procedure: Upper Extremity  Angiography;  Surgeon: Algernon Huxley, MD;  Location: [place] [facility];  Service: Cardiovascular;;  . WOUND DEBRIDEMENT Right 04/11/2018   Procedure: DEBRIDEMENT WOUND calf muscle and skin;  Surgeon: Algernon Huxley, MD;  Location: [facility];  Service: Vascular;  Laterality: Right;    Social History   Socioeconomic History  . Marital status: Single    Spouse name: Not on file  . Number of children: Not on file  . Years of education: Not on file  . Highest education level: Not on file  Occupational History  . Occupation: retired    Comment: Medical laboratory scientific officer  Social Needs  . Financial resource strain: Patient refused  . Food insecurity    Worry: Patient refused    Inability: Patient refused  . Transportation needs    Medical: Patient refused    Non-medical: Patient refused  Tobacco Use  . Smoking status: Former Smoker    Types: Cigarettes    Quit date: 05/17/2005    Years since quitting: 13.5  . Smokeless tobacco: Never Used  Substance and Sexual Activity  . Alcohol use: No  . Drug use: No  . Sexual activity: Not Currently  Lifestyle  . Physical activity    Days per week: Patient refused    Minutes per session: Patient refused  . Stress: Not  on file  Relationships  . Social Herbalist on phone: Patient refused    Gets together: Patient refused    Attends religious service: Patient refused    Active member of club or organization: Patient refused    Attends meetings of clubs or organizations: Patient refused    Relationship status: Patient refused  . Intimate partner violence    Fear of current or ex partner: Patient refused    Emotionally abused: Patient refused    Physically abused: Patient refused    Forced sexual activity: Patient refused  Other Topics Concern  . Not on file  Social History Narrative  . Not on file    Family History  Problem Relation Age of Onset  . Hypertension Other   . Heart disease Other   . Diabetes Mother      Allergies  Allergen Reactions  . Shellfish Allergy Anaphylaxis     Review of Systems   Review of Systems: Negative Unless Checked Constitutional: [] Weight loss  [] Fever  [] Chills Cardiac: [] Chest pain   []  Atrial Fibrillation  [] Palpitations   [] Shortness of breath when laying flat   [] Shortness of breath with exertion. [] Shortness of breath at rest Vascular:  [] Pain in legs with walking   [] Pain in legs with standing [] Pain in legs when laying flat   [] Claudication    [] Pain in feet when laying flat    [] History of DVT   [] Phlebitis   [x] Swelling in legs   [] Varicose veins   [] Non-healing ulcers Pulmonary:   [] Uses home oxygen   [] Productive cough   [] Hemoptysis   [] Wheeze  [] COPD   [] Asthma Neurologic:  [] Dizziness   [] Seizures  [] Blackouts [] History of stroke   [] History of TIA  [] Aphasia   [] Temporary Blindness   [] Weakness or numbness in arm   [] Weakness or numbness in leg Musculoskeletal:   [] Joint swelling   [] Joint pain   [] Low back pain  []  History of Knee Replacement [] Arthritis [] back Surgeries  []  Spinal Stenosis    Hematologic:  [] Easy bruising  [] Easy bleeding   [] Hypercoagulable state   [x] Anemic Gastrointestinal:  [] Diarrhea   [] Vomiting  [] Gastroesophageal reflux/heartburn   [] Difficulty swallowing. [] Abdominal pain Genitourinary:  [x] Chronic kidney disease   [] Difficult urination  [] Anuric   [] Blood in urine [] Frequent urination  [] Burning with urination   [] Hematuria Skin:  [] Rashes   [] Ulcers [] Wounds Psychological:  [x] History of anxiety   []  History of major depression  []  Memory Difficulties      OBJECTIVE:   Physical Exam  BP 103/63 (BP Location: Right Arm)   Pulse 66   Resp 16   Gen: WD/WN, NAD Head: [place]/AT, No temporalis wasting.  Ear/Nose/Throat: Hearing grossly intact, nares w/o erythema or drainage Eyes: PER, EOMI, sclera nonicteric.  Neck: Supple, no masses.  No JVD.  Pulmonary:  Good air movement, no use of accessory muscles.  Cardiac: RRR Vascular:   Well approximated right above-knee amputation except for medial portion has an area which is not fully approximated. Vessel Right Left  Radial Palpable Palpable   Gastrointestinal: soft, non-distended. No guarding/no peritoneal signs.  Musculoskeletal: Right below-knee amputation No deformity or atrophy.  Neurologic: Pain and light touch intact in extremities.  Symmetrical.  Speech is fluent. Motor exam as listed above. Psychiatric: Judgment intact, Mood & affect appropriate for pt's clinical situation. Dermatologic: No Venous rashes. No Ulcers Noted.  No changes consistent with cellulitis. Lymph : No Cervical lymphadenopathy, no lichenification or skin changes of chronic lymphedema.  ASSESSMENT AND PLAN:  1. Above knee amputation of right lower extremity ([place]) Patient stump looks well overall.  Area in the middle causes some concern due to the fact is not fully approximated.  We will have the patient return in 6 weeks for wound evaluation.  The patient is instructed to call sooner if the wound opens or he begins to have any purulent discharge or foul smelling odor from the wound.  The patient is also eager to begin the prosthesis process, therefore we will go ahead and place referral so that way he can begin to work with physical therapy and begin the process of obtaining stronger sock as well as prosthesis.  2. Hyperlipidemia, unspecified hyperlipidemia type Continue statin as ordered and reviewed, no changes at this time   3. Hypertension, unspecified type Continue antihypertensive medications as already ordered, these medications have been reviewed and there are no changes at this time.    Current Outpatient Medications on File Prior to Visit  Medication Sig Dispense Refill  . allopurinol (ZYLOPRIM) 100 MG tablet Take 100 mg by mouth daily.    [name] amLODipine (NORVASC) 5 MG tablet Take 1 tablet (5 mg total) by mouth daily. 30 tablet 0  . aspirin EC 81 MG EC tablet Take 1 tablet  (81 mg total) by mouth daily. 30 tablet 0  . atorvastatin (LIPITOR) 10 MG tablet Take 1 tablet by mouth daily.    . clopidogrel (PLAVIX) 75 MG tablet Take 1 tablet (75 mg total) by mouth daily. 30 tablet 11  . folic acid-vitamin b complex-vitamin c-selenium-zinc (DIALYVITE) 3 MG TABS tablet Take 1 tablet by mouth daily.     [name] gabapentin (NEURONTIN) 100 MG capsule Take 1 capsule (100 mg total) by mouth at bedtime. (Patient taking differently: Take 100 mg by mouth 2 (two) times daily. )    . HYDROcodone-acetaminophen (NORCO) 5-325 MG tablet Take 1 tablet by mouth every 6 (six) hours as needed for moderate pain. 20 tablet 0  . losartan (COZAAR) 100 MG tablet Take 100 mg by mouth daily.    . metoprolol tartrate (LOPRESSOR) 25 MG tablet Take 25 mg by mouth every evening.    . Multiple Vitamin (MULTIVITAMIN) LIQD Take 5 mLs by mouth daily.    . sevelamer carbonate (RENVELA) 800 MG tablet Take 2,400 mg by mouth 3 (three) times daily with meals.     . clonazePAM (KLONOPIN) 0.5 MG tablet Take 1 tablet (0.5 mg total) by mouth as needed. (Patient not taking: Reported on 11/13/2018) 10 tablet 0   No current facility-administered medications on file prior to visit.     There are no Patient Instructions on file for this visit. No follow-ups on file.   Kris Hartmann, NP  This note was completed with Sales executive.  Any errors are purely unintentional.

## 2018-12-17 ENCOUNTER — Other Ambulatory Visit (INDEPENDENT_AMBULATORY_CARE_PROVIDER_SITE_OTHER): Payer: Self-pay | Admitting: Vascular Surgery

## 2019-01-01 ENCOUNTER — Telehealth (INDEPENDENT_AMBULATORY_CARE_PROVIDER_SITE_OTHER): Payer: Self-pay

## 2019-01-01 NOTE — Telephone Encounter (Signed)
Patients caregiver [name] called wanting to know why the patient was sent to see a plastic surgeon instead of someone for a prosthesis. The plastic surgeon stated he did not see patients for prosthetics. She would like him to see someone in [place] if possible.

## 2019-01-02 NOTE — Telephone Encounter (Signed)
A message was left for the patient letting him know that a referral has been sent to [place] for the patients prosthetic and they will contact him.

## 2019-01-02 NOTE — Telephone Encounter (Signed)
Based on our records we haven't sent the patient to a plastic surgeon but we have placed a referral to the company we work with, [employer], for his prosthesis.

## 2019-01-02 NOTE — Telephone Encounter (Signed)
Patient returned my call and it was explained to him that his referral was sent to [facility] for his prosthetic

## 2019-01-03 NOTE — Telephone Encounter (Signed)
Patients caregiver called stating that Medicare and transportation will not take the patient to [facility] because there is a place in [place], [employer] that he can go to. The caregiver will call them and have them fax over what they require for the patient to be seen there.

## 2019-01-09 ENCOUNTER — Other Ambulatory Visit
Admission: RE | Admit: 2019-01-09 | Discharge: 2019-01-09 | Disposition: A | Discharge status: Home / Self Care | Payer: Medicare Other | Source: Ambulatory Visit | Attending: Internal Medicine | Admitting: Internal Medicine

## 2019-01-09 DIAGNOSIS — N186 End stage renal disease: Secondary | ICD-10-CM | POA: Diagnosis present

## 2019-01-09 LAB — POTASSIUM: Potassium: 6.8 mmol/L (ref 3.5–5.1)

## 2019-01-22 ENCOUNTER — Encounter (INDEPENDENT_AMBULATORY_CARE_PROVIDER_SITE_OTHER): Payer: Self-pay

## 2019-01-22 ENCOUNTER — Encounter (INDEPENDENT_AMBULATORY_CARE_PROVIDER_SITE_OTHER): Payer: Self-pay | Admitting: Vascular Surgery

## 2019-01-22 ENCOUNTER — Other Ambulatory Visit: Payer: Self-pay

## 2019-01-22 ENCOUNTER — Ambulatory Visit (INDEPENDENT_AMBULATORY_CARE_PROVIDER_SITE_OTHER): Payer: Medicare Other | Admitting: Vascular Surgery

## 2019-01-22 VITALS — BP 192/92 | HR 84 | Resp 12 | Ht 68.0 in | Wt 171.0 lb

## 2019-01-22 DIAGNOSIS — S78111A Complete traumatic amputation at level between right hip and knee, initial encounter: Secondary | ICD-10-CM

## 2019-01-22 DIAGNOSIS — I10 Essential (primary) hypertension: Secondary | ICD-10-CM

## 2019-01-22 DIAGNOSIS — I2510 Atherosclerotic heart disease of native coronary artery without angina pectoris: Secondary | ICD-10-CM | POA: Insufficient documentation

## 2019-01-22 DIAGNOSIS — E1169 Type 2 diabetes mellitus with other specified complication: Secondary | ICD-10-CM

## 2019-01-22 DIAGNOSIS — I429 Cardiomyopathy, unspecified: Secondary | ICD-10-CM | POA: Insufficient documentation

## 2019-01-22 DIAGNOSIS — N186 End stage renal disease: Secondary | ICD-10-CM

## 2019-01-22 DIAGNOSIS — I509 Heart failure, unspecified: Secondary | ICD-10-CM | POA: Insufficient documentation

## 2019-01-22 NOTE — Assessment & Plan Note (Signed)
blood pressure control important in reducing the progression of atherosclerotic disease. On appropriate oral medications.  

## 2019-01-22 NOTE — Assessment & Plan Note (Signed)
blood glucose control important in reducing the progression of atherosclerotic disease. Also, involved in wound healing. On appropriate medications.  

## 2019-01-22 NOTE — Assessment & Plan Note (Signed)
Well healed. Going for prosthesis

## 2019-01-22 NOTE — Progress Notes (Signed)
Patient ID: Johnny [name]., male   DOB: 07-07-1950, 68 y.o.   MRN: [id number]  Chief Complaint  Patient presents with  . Follow-up    HPI Johnny [name]. is a 68 y.o. male.  Patient follows up today for multiple vascular issues.  He is 3 months status post right above-knee amputation this week.  He is doing well with this.  His wound is healed.  He is seeing a prosthetists who is working on getting him a prosthetic leg. His biggest complaint today is of pain, numbness, and coolness in the right hand.  This is particularly prominent with dialysis.  This started after his access was placed but has gradually worsened over time as the fistula has gotten larger.  He reports no difficulties in terms of how the fistula itself functions.   Past Medical History:  Diagnosis Date  . Anemia   . Anxiety   . CHF (congestive heart failure) ([place])   . Chronic kidney disease    esrd dialysis m/w/f  . Gout   . Hyperlipidemia   . Hypertension   . Myocardial infarction ([place]) 2010   10 years ago  . 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  . Peripheral vascular disease [facility])     Past Surgical History:  Procedure Laterality Date  . A/V FISTULAGRAM Right 09/06/2018   Procedure: A/V FISTULAGRAM;  Surgeon: Algernon Huxley, MD;  Location: [place] [facility];  Service: Cardiovascular;  Laterality: Right;  . A/V SHUNTOGRAM Left 06/21/2017   Procedure: A/V SHUNTOGRAM;  Surgeon: Katha Cabal, MD;  Location: [place] [facility];  Service: Cardiovascular;  Laterality: Left;  . A/V SHUNTOGRAM N/A 10/24/2018   Procedure: A/V SHUNTOGRAM;  Surgeon: Algernon Huxley, MD;  Location: [place] [facility];  Service: Cardiovascular;  Laterality: N/A;  . AMPUTATION Right 10/25/2018   Procedure: AMPUTATION ABOVE KNEE;  Surgeon: Algernon Huxley, MD;  Location: [facility];  Service: General;  Laterality: Right;  . APPLICATION OF WOUND VAC Right 04/11/2018   Procedure: APPLICATION OF WOUND  VAC;  Surgeon: Algernon Huxley, MD;  Location: [facility];  Service: Vascular;  Laterality: Right;  . AV FISTULA PLACEMENT Left 09/18/2015   Procedure: INSERTION OF ARTERIOVENOUS (AV) GORE-TEX GRAFT ARM ( BRACH/AXILLARY GRAFT W/ INSTANT STICK GRAFT );  Surgeon: Katha Cabal, MD;  Location: [facility];  Service: Vascular;  Laterality: Left;  . AV FISTULA PLACEMENT Right 07/19/2018   Procedure: INSERTION OF ARTERIOVENOUS (AV) GORE-TEX GRAFT ARM ( BRACHIAL AXILLARY);  Surgeon: Algernon Huxley, MD;  Location: [facility];  Service: Vascular;  Laterality: Right;  . DIALYSIS FISTULA CREATION Right 10/2017   right chest perm cath  . DIALYSIS/PERMA CATHETER REMOVAL N/A 09/13/2018   Procedure: DIALYSIS/PERMA CATHETER REMOVAL;  Surgeon: Algernon Huxley, MD;  Location: [place] [facility];  Service: Cardiovascular;  Laterality: N/A;  . ESOPHAGOGASTRODUODENOSCOPY N/A 12/19/2017   Procedure: ESOPHAGOGASTRODUODENOSCOPY (EGD);  Surgeon: Lin Landsman, MD;  Location: [facility] ENDOSCOPY;  Service: Gastroenterology;  Laterality: N/A;  . LOWER EXTREMITY ANGIOGRAPHY Left 11/16/2017   Procedure: LOWER EXTREMITY ANGIOGRAPHY;  Surgeon: Algernon Huxley, MD;  Location: [place] [facility];  Service: Cardiovascular;  Laterality: Left;  . LOWER EXTREMITY ANGIOGRAPHY Right 01/18/2018   Procedure: LOWER EXTREMITY ANGIOGRAPHY;  Surgeon: Algernon Huxley, MD;  Location: [place] [facility];  Service: Cardiovascular;  Laterality: Right;  . LOWER EXTREMITY ANGIOGRAPHY Left 04/02/2018   Procedure: LOWER EXTREMITY ANGIOGRAPHY;  Surgeon:  Algernon Huxley, MD;  Location: [place] [facility];  Service: Cardiovascular;  Laterality: Left;  . LOWER EXTREMITY ANGIOGRAPHY Right 04/09/2018   Procedure: Lower Extremity Angiography with possible intervention;  Surgeon: Algernon Huxley, MD;  Location: [place] [facility];  Service: Cardiovascular;  Laterality: Right;  . LOWER EXTREMITY ANGIOGRAPHY Right 07/23/2018   Procedure: Lower Extremity Angiography;  Surgeon:  Algernon Huxley, MD;  Location: [place] [facility];  Service: Cardiovascular;  Laterality: Right;  . LOWER EXTREMITY ANGIOGRAPHY Right 09/13/2018   Procedure: LOWER EXTREMITY ANGIOGRAPHY;  Surgeon: Algernon Huxley, MD;  Location: [place] [facility];  Service: Cardiovascular;  Laterality: Right;  . LOWER EXTREMITY VENOGRAPHY Right 09/13/2018   Procedure: LOWER EXTREMITY VENOGRAPHY;  Surgeon: Algernon Huxley, MD;  Location: [place] [facility];  Service: Cardiovascular;  Laterality: Right;  . PERIPHERAL VASCULAR CATHETERIZATION Left 09/01/2015   Procedure: A/V Shuntogram/Fistulagram;  Surgeon: Katha Cabal, MD;  Location: [place] [facility];  Service: Cardiovascular;  Laterality: Left;  . PERIPHERAL VASCULAR CATHETERIZATION N/A 09/30/2015   Procedure: A/V Shuntogram/Fistulagram with perm cathether removal;  Surgeon: Algernon Huxley, MD;  Location: [place] [facility];  Service: Cardiovascular;  Laterality: N/A;  . PERIPHERAL VASCULAR CATHETERIZATION Left 09/30/2015   Procedure: A/V Shunt Intervention;  Surgeon: Algernon Huxley, MD;  Location: [place] [facility];  Service: Cardiovascular;  Laterality: Left;  . PERIPHERAL VASCULAR CATHETERIZATION Left 12/03/2015   Procedure: Thrombectomy;  Surgeon: Algernon Huxley, MD;  Location: [place] [facility];  Service: Cardiovascular;  Laterality: Left;  . PERIPHERAL VASCULAR CATHETERIZATION Left 01/28/2016   Procedure: Thrombectomy;  Surgeon: Algernon Huxley, MD;  Location: [place] [facility];  Service: Cardiovascular;  Laterality: Left;  . PERIPHERAL VASCULAR CATHETERIZATION N/A 01/28/2016   Procedure: A/V Shuntogram/Fistulagram;  Surgeon: Algernon Huxley, MD;  Location: [place] [facility];  Service: Cardiovascular;  Laterality: N/A;  . SKIN SPLIT GRAFT Right 05/24/2018   Procedure: SKIN GRAFT SPLIT THICKNESS ( RIGHT CALF);  Surgeon: Algernon Huxley, MD;  Location: [facility];  Service: Vascular;  Laterality: Right;  . UPPER EXTREMITY ANGIOGRAPHY  10/24/2018   Procedure: Upper  Extremity Angiography;  Surgeon: Algernon Huxley, MD;  Location: [place] [facility];  Service: Cardiovascular;;  . WOUND DEBRIDEMENT Right 04/11/2018   Procedure: DEBRIDEMENT WOUND calf muscle and skin;  Surgeon: Algernon Huxley, MD;  Location: [facility];  Service: Vascular;  Laterality: Right;      Allergies  Allergen Reactions  . Shellfish Allergy Anaphylaxis    Current Outpatient Medications  Medication Sig Dispense Refill  . allopurinol (ZYLOPRIM) 100 MG tablet Take 100 mg by mouth daily.    [name] amLODipine (NORVASC) 5 MG tablet Take 1 tablet (5 mg total) by mouth daily. 30 tablet 0  . atorvastatin (LIPITOR) 10 MG tablet Take 1 tablet by mouth daily.    . clonazePAM (KLONOPIN) 0.5 MG tablet Take 1 tablet (0.5 mg total) by mouth as needed. 10 tablet 0  . clopidogrel (PLAVIX) 75 MG tablet Take 1 tablet (75 mg total) by mouth daily. 30 tablet 11  . folic acid-vitamin b complex-vitamin c-selenium-zinc (DIALYVITE) 3 MG TABS tablet Take 1 tablet by mouth daily.     [name] gabapentin (NEURONTIN) 100 MG capsule Take 1 capsule (100 mg total) by mouth at bedtime. (Patient taking differently: Take 100 mg by mouth 2 (two) times daily. )    . HYDROcodone-acetaminophen (NORCO) 5-325 MG tablet Take 1 tablet by mouth every 6 (  six) hours as needed for moderate pain. 20 tablet 0  . losartan (COZAAR) 100 MG tablet Take 100 mg by mouth daily.    . metoprolol tartrate (LOPRESSOR) 25 MG tablet Take 25 mg by mouth every evening.    . Multiple Vitamin (MULTIVITAMIN) LIQD Take 5 mLs by mouth daily.    . sevelamer carbonate (RENVELA) 800 MG tablet Take 2,400 mg by mouth 3 (three) times daily with meals.      No current facility-administered medications for this visit.         Physical Exam BP (!) 192/92 (BP Location: Left Arm, Patient Position: Sitting, Cuff Size: Normal)   Pulse 84   Resp 12   Ht 5\' 8"  (1.727 m)   Wt 171 lb (77.6 kg)   BMI 26.00 kg/m  Gen:  WD/WN, NAD Skin: incision C/D/I at the AKA site  Ext: Right radial pulse is not palpable. Excellent thrill in right arm AVF     Assessment/Plan:  Hypertension blood pressure control important in reducing the progression of atherosclerotic disease. On appropriate oral medications.   Diabetes ([place]) blood glucose control important in reducing the progression of atherosclerotic disease. Also, involved in wound healing. On appropriate medications.   Above knee amputation of right lower extremity ([place]) Well healed. Going for prosthesis  End stage renal disease ([place]) At this point, the patient still symptoms are becoming much worse in his right hand.  The fistula is getting larger and that is not surprising.  We will plan a right upper extremity angiogram in the near future at his convenience.  He will need anesthesia with the procedure.  Endovascular options may be available at that time, but if not we will have to consider banding of the AV fistula.      Leotis Pain 01/22/2019, 10:03 AM   This note was created with Dragon medical transcription system.  Any errors from dictation are unintentional.

## 2019-01-22 NOTE — Assessment & Plan Note (Signed)
At this point, the patient still symptoms are becoming much worse in his right hand.  The fistula is getting larger and that is not surprising.  We will plan a right upper extremity angiogram in the near future at his convenience.  He will need anesthesia with the procedure.  Endovascular options may be available at that time, but if not we will have to consider banding of the AV fistula.

## 2019-01-25 ENCOUNTER — Other Ambulatory Visit: Payer: Self-pay | Admitting: Internal Medicine

## 2019-01-30 ENCOUNTER — Telehealth (INDEPENDENT_AMBULATORY_CARE_PROVIDER_SITE_OTHER): Payer: Self-pay

## 2019-01-30 NOTE — Telephone Encounter (Signed)
Spoke with the patient' caretaker [name] and the patient is now scheduled with Dr. Lucky Cowboy for angio on 02/14/2019 with a 9:15 am arrival time to the [facility]. Patient will do his Covid test on 02/11/2019 between 12:30-2:30pm at the [facility]. Pre-procedure instructions and times will be mailed to the patient.

## 2019-02-11 ENCOUNTER — Other Ambulatory Visit
Admission: RE | Admit: 2019-02-11 | Discharge: 2019-02-11 | Disposition: A | Discharge status: Home / Self Care | Payer: Medicare Other | Source: Ambulatory Visit | Attending: Vascular Surgery | Admitting: Vascular Surgery

## 2019-02-11 ENCOUNTER — Other Ambulatory Visit: Payer: Self-pay

## 2019-02-11 DIAGNOSIS — Z01818 Encounter for other preprocedural examination: Secondary | ICD-10-CM | POA: Diagnosis present

## 2019-02-11 DIAGNOSIS — Z20828 Contact with and (suspected) exposure to other viral communicable diseases: Secondary | ICD-10-CM | POA: Diagnosis not present

## 2019-02-12 LAB — SARS CORONAVIRUS 2 (TAT 6-24 HRS): SARS Coronavirus 2: NEGATIVE

## 2019-02-13 ENCOUNTER — Other Ambulatory Visit (INDEPENDENT_AMBULATORY_CARE_PROVIDER_SITE_OTHER): Payer: Self-pay | Admitting: Nurse Practitioner

## 2019-02-13 MED ORDER — CEFAZOLIN SODIUM-DEXTROSE 1-4 GM/50ML-% IV SOLN
1.0000 g | Freq: Once | INTRAVENOUS | Status: AC
  Administered 2019-02-14: 1 g via INTRAVENOUS

## 2019-02-14 ENCOUNTER — Encounter
Admission: RE | Disposition: A | Discharge status: Home / Self Care | Payer: Self-pay | Source: Home / Self Care | Attending: Vascular Surgery

## 2019-02-14 ENCOUNTER — Ambulatory Visit
Admission: RE | Admit: 2019-02-14 | Discharge: 2019-02-14 | Disposition: A | Discharge status: Home / Self Care | Payer: Medicare Other | Attending: Vascular Surgery | Admitting: Vascular Surgery

## 2019-02-14 ENCOUNTER — Other Ambulatory Visit: Payer: Self-pay

## 2019-02-14 DIAGNOSIS — Z89611 Acquired absence of right leg above knee: Secondary | ICD-10-CM | POA: Diagnosis not present

## 2019-02-14 DIAGNOSIS — Z992 Dependence on renal dialysis: Secondary | ICD-10-CM | POA: Diagnosis not present

## 2019-02-14 DIAGNOSIS — M109 Gout, unspecified: Secondary | ICD-10-CM | POA: Diagnosis not present

## 2019-02-14 DIAGNOSIS — N186 End stage renal disease: Secondary | ICD-10-CM | POA: Diagnosis not present

## 2019-02-14 DIAGNOSIS — F419 Anxiety disorder, unspecified: Secondary | ICD-10-CM | POA: Diagnosis not present

## 2019-02-14 DIAGNOSIS — I509 Heart failure, unspecified: Secondary | ICD-10-CM | POA: Diagnosis not present

## 2019-02-14 DIAGNOSIS — I132 Hypertensive heart and chronic kidney disease with heart failure and with stage 5 chronic kidney disease, or end stage renal disease: Secondary | ICD-10-CM | POA: Diagnosis not present

## 2019-02-14 DIAGNOSIS — E1140 Type 2 diabetes mellitus with diabetic neuropathy, unspecified: Secondary | ICD-10-CM | POA: Diagnosis not present

## 2019-02-14 DIAGNOSIS — E1122 Type 2 diabetes mellitus with diabetic chronic kidney disease: Secondary | ICD-10-CM | POA: Diagnosis not present

## 2019-02-14 DIAGNOSIS — E785 Hyperlipidemia, unspecified: Secondary | ICD-10-CM | POA: Insufficient documentation

## 2019-02-14 DIAGNOSIS — I252 Old myocardial infarction: Secondary | ICD-10-CM | POA: Insufficient documentation

## 2019-02-14 DIAGNOSIS — Z7902 Long term (current) use of antithrombotics/antiplatelets: Secondary | ICD-10-CM | POA: Diagnosis not present

## 2019-02-14 DIAGNOSIS — G458 Other transient cerebral ischemic attacks and related syndromes: Secondary | ICD-10-CM

## 2019-02-14 DIAGNOSIS — Z79899 Other long term (current) drug therapy: Secondary | ICD-10-CM | POA: Diagnosis not present

## 2019-02-14 DIAGNOSIS — E1151 Type 2 diabetes mellitus with diabetic peripheral angiopathy without gangrene: Secondary | ICD-10-CM | POA: Insufficient documentation

## 2019-02-14 HISTORY — PX: UPPER EXTREMITY ANGIOGRAPHY: CATH118270

## 2019-02-14 LAB — POTASSIUM (ARMC VASCULAR LAB ONLY): Potassium (ARMC vascular lab): 4.4 (ref 3.5–5.1)

## 2019-02-14 SURGERY — UPPER EXTREMITY ANGIOGRAPHY
Anesthesia: Moderate Sedation | Laterality: Right

## 2019-02-14 MED ORDER — HYDROMORPHONE HCL 1 MG/ML IJ SOLN: 1.0000 mg | Freq: Once | INTRAMUSCULAR | Status: DC | PRN

## 2019-02-14 MED ORDER — MIDAZOLAM HCL 2 MG/ML PO SYRP
8.0000 mg | ORAL_SOLUTION | Freq: Once | ORAL | Status: AC | PRN
  Administered 2019-02-14: 8 mg via ORAL

## 2019-02-14 MED ORDER — DIPHENHYDRAMINE HCL 50 MG/ML IJ SOLN
50.0000 mg | Freq: Once | INTRAMUSCULAR | Status: AC | PRN
  Administered 2019-02-14: 10:00:00 50 mg via INTRAVENOUS

## 2019-02-14 MED ORDER — SODIUM CHLORIDE 0.9 % IV SOLN
INTRAVENOUS | Status: DC
  Administered 2019-02-14: 10:00:00 via INTRAVENOUS

## 2019-02-14 MED ORDER — FENTANYL CITRATE (PF) 100 MCG/2ML IJ SOLN
INTRAMUSCULAR | Status: AC
  Filled 2019-02-14: qty 2

## 2019-02-14 MED ORDER — FAMOTIDINE 20 MG PO TABS
ORAL_TABLET | ORAL | Status: AC
  Administered 2019-02-14: 10:00:00 40 mg via ORAL
  Filled 2019-02-14: qty 2

## 2019-02-14 MED ORDER — FAMOTIDINE 20 MG PO TABS
40.0000 mg | ORAL_TABLET | Freq: Once | ORAL | Status: AC | PRN
  Administered 2019-02-14: 10:00:00 40 mg via ORAL

## 2019-02-14 MED ORDER — HEPARIN SODIUM (PORCINE) 1000 UNIT/ML IJ SOLN
INTRAMUSCULAR | Status: AC
  Filled 2019-02-14: qty 1

## 2019-02-14 MED ORDER — MIDAZOLAM HCL 2 MG/2ML IJ SOLN
INTRAMUSCULAR | Status: DC | PRN
  Administered 2019-02-14: 2 mg via INTRAVENOUS
  Administered 2019-02-14: 1 mg via INTRAVENOUS

## 2019-02-14 MED ORDER — METHYLPREDNISOLONE SODIUM SUCC 125 MG IJ SOLR
125.0000 mg | Freq: Once | INTRAMUSCULAR | Status: AC | PRN
  Administered 2019-02-14: 10:00:00 125 mg via INTRAVENOUS

## 2019-02-14 MED ORDER — ASPIRIN EC 81 MG PO TBEC: 81.0000 mg | DELAYED_RELEASE_TABLET | Freq: Every day | ORAL | 2 refills | Status: DC

## 2019-02-14 MED ORDER — HEPARIN SODIUM (PORCINE) 1000 UNIT/ML IJ SOLN
INTRAMUSCULAR | Status: DC | PRN
  Administered 2019-02-14: 4000 [IU] via INTRAVENOUS

## 2019-02-14 MED ORDER — METHYLPREDNISOLONE SODIUM SUCC 125 MG IJ SOLR
INTRAMUSCULAR | Status: AC
  Administered 2019-02-14: 125 mg via INTRAVENOUS
  Filled 2019-02-14: qty 2

## 2019-02-14 MED ORDER — FENTANYL CITRATE (PF) 100 MCG/2ML IJ SOLN
INTRAMUSCULAR | Status: DC | PRN
  Administered 2019-02-14 (×2): 50 ug via INTRAVENOUS

## 2019-02-14 MED ORDER — MIDAZOLAM HCL 2 MG/ML PO SYRP
ORAL_SOLUTION | ORAL | Status: AC
  Filled 2019-02-14: qty 4

## 2019-02-14 MED ORDER — DIPHENHYDRAMINE HCL 50 MG/ML IJ SOLN
INTRAMUSCULAR | Status: AC
  Administered 2019-02-14: 10:00:00 50 mg via INTRAVENOUS
  Filled 2019-02-14: qty 1

## 2019-02-14 MED ORDER — MIDAZOLAM HCL 5 MG/5ML IJ SOLN
INTRAMUSCULAR | Status: AC
  Filled 2019-02-14: qty 5

## 2019-02-14 MED ORDER — ONDANSETRON HCL 4 MG/2ML IJ SOLN: 4.0000 mg | Freq: Four times a day (QID) | INTRAMUSCULAR | Status: DC | PRN

## 2019-02-14 SURGICAL SUPPLY — 23 items
BALLN LUTONIX 7X80X130 (BALLOONS)
BALLN LUTONIX DCB 6X100X130 (BALLOONS) ×2
BALLN LUTONIX DCB 7X60X130 (BALLOONS) ×2
BALLOON LUTONIX 7X80X130 (BALLOONS) IMPLANT
BALLOON LUTONIX DCB 6X100X130 (BALLOONS) IMPLANT
BALLOON LUTONIX DCB 7X60X130 (BALLOONS) IMPLANT
CATH ANGIO 5F 100CM .035 PIG (CATHETERS) ×1 IMPLANT
CATH BEACON 5 .035 100 H1 TIP (CATHETERS) ×1 IMPLANT
CATH CXI SUPP ANG 4FR 135 (CATHETERS) IMPLANT
CATH CXI SUPP ANG 4FR 135CM (CATHETERS) ×2
DEVICE PRESTO INFLATION (MISCELLANEOUS) ×1 IMPLANT
DEVICE STARCLOSE SE CLOSURE (Vascular Products) ×1 IMPLANT
DEVICE TORQUE .025-.038 (MISCELLANEOUS) ×1 IMPLANT
GLIDEWIRE ANGLED SS 035X260CM (WIRE) ×1 IMPLANT
PACK ANGIOGRAPHY (CUSTOM PROCEDURE TRAY) ×1 IMPLANT
SHEATH BRITE TIP 5FRX11 (SHEATH) ×1 IMPLANT
SHEATH SHUTTLE 6FRX80 (SHEATH) ×1 IMPLANT
STENT LIFESTAR 8X60X80 (Permanent Stent) IMPLANT
SYR MEDRAD MARK 7 150ML (SYRINGE) ×1 IMPLANT
TUBING CONTRAST HIGH PRESS 72 (TUBING) ×1 IMPLANT
VALVE CHECKFLO PERFORMER (SHEATH) ×1 IMPLANT
WIRE J 3MM .035X145CM (WIRE) ×1 IMPLANT
WIRE MAGIC TORQUE 260C (WIRE) ×1 IMPLANT

## 2019-02-14 NOTE — H&P (Signed)
[facility] History & Physical Update  The patient was interviewed and re-examined.  The patient's previous History and Physical has been reviewed and is unchanged.  There is no change in the plan of care. We plan to proceed with the scheduled procedure.  Leotis Pain, MD  02/14/2019, 9:11 AM

## 2019-02-14 NOTE — Op Note (Signed)
OPERATIVE REPORT   PREOPERATIVE DIAGNOSIS: 1. End-stage renal disease. 2. 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  POSTOPERATIVE DIAGNOSIS: Same as above  PROCEDURE PERFORMED: 1. Ultrasound guidance vascular access to right femoral artery. 2. Catheter placement to right radial artery and right ulnar arteries  from right femoral approach. 3. Thoracic aortogram and selective right upper extremity angiogram  including selective images of the radial and ulnar arteries. 4.  Percutaneous transluminal angioplasty of the right subclavian and axillary arteries with 6 mm diameter and 7 mm diameter Lutonix drug-coated angioplasty balloons 5. StarClose closure device right femoral artery.  SURGEON: Algernon Huxley, MD  ANESTHESIA: Local with moderate conscious sedation for 40 minutes using 3 mg of Versed and 100 mcg of Fentanyl  BLOOD LOSS: Minimal.  FLUOROSCOPY TIME:11.1 minutes  INDICATION FOR PROCEDURE: This is a 68 y.o.male who presented to our office with steal syndrome. The patient's right arm AV graft is working well, but their hand is numb and painful. To further evaluate this to determine what options would be possible to treat the steal syndrome, angiogram of the left upper extremity is indicated. Risks and benefits are discussed. Informed consent was obtained.  DESCRIPTION OF PROCEDURE: The patient was brought to the vascular suite. Moderate conscious sedation was administered during a face to face encounter with the patient throughout the procedure with my supervision of the RN administering medicines and monitoring the patient's vital signs, pulse oximetry, telemetry and mental status throughout from the start of the procedure until the patient was taken to the recovery room.  Groins were shaved and prepped and sterile surgical field was created. The right femoral head was localized with fluoroscopy and the right femoral artery was then  visualized with ultrasound and found to be widely patent. It was then accessed under direct ultrasound guidance without difficulty with a Seldinger needle and a permanent image was recorded. A J-wire and 5-French sheath were then placed. Pigtail catheter was placed into the ascending aorta and a thoracic aortogram was then performed in the LAO projection. This demonstrated normal origins to the great vessels without significant proximal stenoses and a normal configuration of the great vessels. The patient was given 3000 units of intravenous heparin and a headhunter catheter was used to selectively cannulate the innominate artery and then advanced into the right subclavian artery without difficulty. Selective imaging was performed in the right subclavian, axillary, and brachial arteries at this time.  The right subclavian artery had about an 80% stenosis, the vessel then normalized for several centimeters, and the right axillary artery had about a 65 to 70% stenosis.  The remainder of the brachial artery was calcific but no hemodynamically significant stenosis was seen down to the level of the AV access.  I then exchanged for a CXI catheter and this was then sequentially advanced to the brachial artery and to the brachial bifurcation.  Steal was demonstrated with much of the flow from the artery going into the fistula and not downstream to the hand on images with the catheter proximal to the access. I then advanced a catheter beyond the access and into the radial and ulnar arteries.  The radial artery was entered first, and the CXI catheter was advanced several centimeters into the radial artery.  Selective imaging of the radial artery demonstrates good flow into the hand with diffuse calcific disease but no significant stenosis.  The CXI catheter was then pulled back to the brachial bifurcation and using the Glidewire advanced into the  proximal right ulnar artery.  The right ulnar artery was a little  smaller and a little more calcific with a couple areas of mild stenosis but nothing that appeared to be more than 50%.  There was reasonably good flow in the hand through both vessels.  At this point, I felt that treating the proximal lesion would be of the most benefit in trying to improve his right hand pain.  We placed a long Magic torque wire and a 6 [place] shuttle sheath was placed over the Magic torque wire after the CXI catheter was removed.  The sheath was parked in the proximal right subclavian artery.  I started with a 6 mm diameter by 10 cm length Lutonix drug-coated angioplasty balloon from the mid subclavian artery down to the mid axillary artery to encompass both lesions.  This was inflated to 12 atm for 1 minute.  Completion imaging showed about a 30 to 35% residual stenosis in the axillary artery but a 60% residual stenosis in the subclavian artery.  I then upsized to a 7 mm diameter Lutonix drug-coated angioplasty balloon and inflated this to 10 atm for 1 minute and the right subclavian artery.  Completion imaging showed a roughly 40% residual stenosis.  I considered placing a stent in this location, but we did not have an appropriate size and length stent on the shelf to address this on a long enough working shaft.  As such, I elected to see how the patient did with angioplasty alone and follow him in the office. The diagnostic catheter was removed. Oblique arteriogram was performed of the right femoral artery and StarClose closure device deployed in the usual fashion with excellent hemostatic result. The patient tolerated the procedure well and was taken to the recovery room in stable condition.   Leotis Pain 02/14/2019 11:16 AM

## 2019-02-14 NOTE — Progress Notes (Signed)
Dr. Lucky Cowboy at bedside, speaking with pt. Re: procedural results. Pt. Verbalized understanding of conversation. Pt. Stable for DC home.

## 2019-04-08 ENCOUNTER — Telehealth (INDEPENDENT_AMBULATORY_CARE_PROVIDER_SITE_OTHER): Payer: Self-pay | Admitting: Vascular Surgery

## 2019-04-08 NOTE — Telephone Encounter (Signed)
All paperwork faxed from [facility] has been faxed to them over again. There are no pending orders to be signed on the providers desk.

## 2019-04-08 NOTE — Telephone Encounter (Signed)
BRENDA GLASS AND PATIENT HAVE BEEN ADVISED THAT ALL PAPERWORK HAS BEEN FAXED AGAIN AND NO OTHER PENDING ORDERS TO BE SIGNED ON THE PROVIDERS DESK.

## 2019-04-16 ENCOUNTER — Telehealth (INDEPENDENT_AMBULATORY_CARE_PROVIDER_SITE_OTHER): Payer: Self-pay | Admitting: Vascular Surgery

## 2019-04-17 NOTE — Telephone Encounter (Signed)
The patient has an appt in our office to update his information for his prosthetic.

## 2019-04-23 ENCOUNTER — Encounter (INDEPENDENT_AMBULATORY_CARE_PROVIDER_SITE_OTHER): Payer: Self-pay | Admitting: Nurse Practitioner

## 2019-04-23 ENCOUNTER — Ambulatory Visit (INDEPENDENT_AMBULATORY_CARE_PROVIDER_SITE_OTHER): Payer: Medicare Other | Admitting: Nurse Practitioner

## 2019-04-23 ENCOUNTER — Other Ambulatory Visit: Payer: Self-pay

## 2019-04-23 ENCOUNTER — Encounter (INDEPENDENT_AMBULATORY_CARE_PROVIDER_SITE_OTHER): Payer: Self-pay

## 2019-04-23 VITALS — BP 157/80 | HR 83 | Resp 16

## 2019-04-23 DIAGNOSIS — I739 Peripheral vascular disease, unspecified: Secondary | ICD-10-CM | POA: Diagnosis not present

## 2019-04-23 DIAGNOSIS — S78111A Complete traumatic amputation at level between right hip and knee, initial encounter: Secondary | ICD-10-CM

## 2019-04-23 DIAGNOSIS — N186 End stage renal disease: Secondary | ICD-10-CM

## 2019-04-23 NOTE — Progress Notes (Signed)
SUBJECTIVE:  Patient ID: Johnny Bucco., male    DOB: 08/27/1950, 68 y.o.   MRN: [id number] Chief Complaint  Patient presents with  . Follow-up    HPI  Johnny Johnny [name]. is a 68 y.o. male that presents today for evaluation of his right below-knee amputation.  Currently the patient's right below-knee amputation is well-healed.  The patient had a transfemoral amputation done of the right lower extremity on 10/25/2018.  He is currently heavily motivated to get his first prosthesis.  He denies any fever, chills, nausea, vomiting or diarrhea.  He denies any chest pain or shortness of breath.  He denies any chest pain or shortness of breath.  He also states that dialysis is going very well and that he has very good flow volumes.  He denies any continued signs symptoms of steal syndrome however he does endorse having some numbness of his right hand however it is significantly better than it was previously.  It continues to get better over time.  He denies any pain of his left lower extremity or any lower extremity ulceration.  Past Medical History:  Diagnosis Date  . Anemia   . Anxiety   . CHF (congestive heart failure) ([place])   . Chronic kidney disease    esrd dialysis m/w/f  . Gout   . Hyperlipidemia   . Hypertension   . Myocardial infarction ([place]) 2010   10 years ago  . 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  . Peripheral vascular disease [facility])     Past Surgical History:  Procedure Laterality Date  . A/V FISTULAGRAM Right 09/06/2018   Procedure: A/V FISTULAGRAM;  Surgeon: Algernon Huxley, MD;  Location: [place] [facility];  Service: Cardiovascular;  Laterality: Right;  . A/V SHUNTOGRAM Left 06/21/2017   Procedure: A/V SHUNTOGRAM;  Surgeon: Katha Cabal, MD;  Location: [place] [facility];  Service: Cardiovascular;  Laterality: Left;  . A/V SHUNTOGRAM N/A 10/24/2018   Procedure: A/V SHUNTOGRAM;  Surgeon: Algernon Huxley, MD;  Location: [place] [facility];  Service: Cardiovascular;  Laterality: N/A;  . ABOVE KNEE LEG AMPUTATION Right 2020  . AMPUTATION Right 10/25/2018   Procedure: AMPUTATION ABOVE KNEE;  Surgeon: Algernon Huxley, MD;  Location: [facility] ORS;  Service: General;  Laterality: Right;  . APPLICATION OF WOUND VAC Right 04/11/2018   Procedure: APPLICATION OF WOUND VAC;  Surgeon: Algernon Huxley, MD;  Location: [facility] ORS;  Service: Vascular;  Laterality: Right;  . AV FISTULA PLACEMENT Left 09/18/2015   Procedure: INSERTION OF ARTERIOVENOUS (AV) GORE-TEX GRAFT ARM ( BRACH/AXILLARY GRAFT W/ INSTANT STICK GRAFT );  Surgeon: Katha Cabal, MD;  Location: [facility] ORS;  Service: Vascular;  Laterality: Left;  . AV FISTULA PLACEMENT Right 07/19/2018   Procedure: INSERTION OF ARTERIOVENOUS (AV) GORE-TEX GRAFT ARM ( BRACHIAL AXILLARY);  Surgeon: Algernon Huxley, MD;  Location: [facility] ORS;  Service: Vascular;  Laterality: Right;  . DIALYSIS FISTULA CREATION Right 10/2017   right chest perm cath  . DIALYSIS/PERMA CATHETER REMOVAL N/A 09/13/2018   Procedure: DIALYSIS/PERMA CATHETER REMOVAL;  Surgeon: Algernon Huxley, MD;  Location: [place] [facility];  Service: Cardiovascular;  Laterality: N/A;  . ESOPHAGOGASTRODUODENOSCOPY N/A 12/19/2017   Procedure: ESOPHAGOGASTRODUODENOSCOPY (EGD);  Surgeon: Lin Landsman, MD;  Location: [facility] ENDOSCOPY;  Service: Gastroenterology;  Laterality: N/A;  . LOWER EXTREMITY ANGIOGRAPHY Left 11/16/2017   Procedure: LOWER EXTREMITY ANGIOGRAPHY;  Surgeon: Algernon Huxley, MD;  Location: [place] [facility]  LAB;  Service: Cardiovascular;  Laterality: Left;  . LOWER EXTREMITY ANGIOGRAPHY Right 01/18/2018   Procedure: LOWER EXTREMITY ANGIOGRAPHY;  Surgeon: Algernon Huxley, MD;  Location: [place] [facility];  Service: Cardiovascular;  Laterality: Right;  . LOWER EXTREMITY ANGIOGRAPHY Left 04/02/2018   Procedure: LOWER EXTREMITY ANGIOGRAPHY;  Surgeon: Algernon Huxley, MD;  Location: [place] [facility];  Service: Cardiovascular;   Laterality: Left;  . LOWER EXTREMITY ANGIOGRAPHY Right 04/09/2018   Procedure: Lower Extremity Angiography with possible intervention;  Surgeon: Algernon Huxley, MD;  Location: [place] [facility];  Service: Cardiovascular;  Laterality: Right;  . LOWER EXTREMITY ANGIOGRAPHY Right 07/23/2018   Procedure: Lower Extremity Angiography;  Surgeon: Algernon Huxley, MD;  Location: [place] [facility];  Service: Cardiovascular;  Laterality: Right;  . LOWER EXTREMITY ANGIOGRAPHY Right 09/13/2018   Procedure: LOWER EXTREMITY ANGIOGRAPHY;  Surgeon: Algernon Huxley, MD;  Location: [place] [facility];  Service: Cardiovascular;  Laterality: Right;  . LOWER EXTREMITY VENOGRAPHY Right 09/13/2018   Procedure: LOWER EXTREMITY VENOGRAPHY;  Surgeon: Algernon Huxley, MD;  Location: [place] [facility];  Service: Cardiovascular;  Laterality: Right;  . PERIPHERAL VASCULAR CATHETERIZATION Left 09/01/2015   Procedure: A/V Shuntogram/Fistulagram;  Surgeon: Katha Cabal, MD;  Location: [place] [facility];  Service: Cardiovascular;  Laterality: Left;  . PERIPHERAL VASCULAR CATHETERIZATION N/A 09/30/2015   Procedure: A/V Shuntogram/Fistulagram with perm cathether removal;  Surgeon: Algernon Huxley, MD;  Location: [place] [facility];  Service: Cardiovascular;  Laterality: N/A;  . PERIPHERAL VASCULAR CATHETERIZATION Left 09/30/2015   Procedure: A/V Shunt Intervention;  Surgeon: Algernon Huxley, MD;  Location: [place] [facility];  Service: Cardiovascular;  Laterality: Left;  . PERIPHERAL VASCULAR CATHETERIZATION Left 12/03/2015   Procedure: Thrombectomy;  Surgeon: Algernon Huxley, MD;  Location: [place] [facility];  Service: Cardiovascular;  Laterality: Left;  . PERIPHERAL VASCULAR CATHETERIZATION Left 01/28/2016   Procedure: Thrombectomy;  Surgeon: Algernon Huxley, MD;  Location: [place] [facility];  Service: Cardiovascular;  Laterality: Left;  . PERIPHERAL VASCULAR CATHETERIZATION N/A 01/28/2016   Procedure: A/V Shuntogram/Fistulagram;  Surgeon:  Algernon Huxley, MD;  Location: [place] [facility];  Service: Cardiovascular;  Laterality: N/A;  . SKIN SPLIT GRAFT Right 05/24/2018   Procedure: SKIN GRAFT SPLIT THICKNESS ( RIGHT CALF);  Surgeon: Algernon Huxley, MD;  Location: [facility] ORS;  Service: Vascular;  Laterality: Right;  . UPPER EXTREMITY ANGIOGRAPHY  10/24/2018   Procedure: Upper Extremity Angiography;  Surgeon: Algernon Huxley, MD;  Location: [place] [facility];  Service: Cardiovascular;;  . UPPER EXTREMITY ANGIOGRAPHY Right 02/14/2019   Procedure: UPPER EXTREMITY ANGIOGRAPHY;  Surgeon: Algernon Huxley, MD;  Location: [place] [facility];  Service: Cardiovascular;  Laterality: Right;  . WOUND DEBRIDEMENT Right 04/11/2018   Procedure: DEBRIDEMENT WOUND calf muscle and skin;  Surgeon: Algernon Huxley, MD;  Location: [facility] ORS;  Service: Vascular;  Laterality: Right;    Social History   Socioeconomic History  . Marital status: Single    Spouse name: Not on file  . Number of children: 0  . Years of education: Not on file  . Highest education level: Not on file  Occupational History  . Occupation: retired    Comment: Medical laboratory scientific officer  Tobacco Use  . Smoking status: Former Smoker    Types: Cigarettes    Quit date: 05/17/2005    Years since quitting: 13.9  . Smokeless tobacco: Never Used  Substance and Sexual  Activity  . Alcohol use: No  . Drug use: No  . Sexual activity: Not Currently  Other Topics Concern  . Not on file  Social History Narrative  . Not on file   Social Determinants of Health   Financial Resource Strain:   . Difficulty of Paying Living Expenses: Not on file  Food Insecurity:   . Worried About Charity fundraiser in the Last Year: Not on file  . Ran Out of Food in the Last Year: Not on file  Transportation Needs:   . Lack of Transportation (Medical): Not on file  . Lack of Transportation (Non-Medical): Not on file  Physical Activity:   . Days of Exercise per Week: Not on file  . Minutes of Exercise per  Session: Not on file  Stress:   . Feeling of Stress : Not on file  Social Connections:   . Frequency of Communication with Friends and Family: Not on file  . Frequency of Social Gatherings with Friends and Family: Not on file  . Attends Religious Services: Not on file  . Active Member of Clubs or Organizations: Not on file  . Attends Archivist Meetings: Not on file  . Marital Status: Not on file  Intimate Partner Violence:   . Fear of Current or Ex-Partner: Not on file  . Emotionally Abused: Not on file  . Physically Abused: Not on file  . Sexually Abused: Not on file    Family History  Problem Relation Age of Onset  . Hypertension Other   . Heart disease Other   . Diabetes Mother     Allergies  Allergen Reactions  . Shellfish Allergy Anaphylaxis     Review of Systems   Review of Systems: Negative Unless Checked Constitutional: [] Weight loss  [] Fever  [] Chills Cardiac: [] Chest pain   []  Atrial Fibrillation  [] Palpitations   [] Shortness of breath when laying flat   [] Shortness of breath with exertion. [] Shortness of breath at rest Vascular:  [] Pain in legs with walking   [] Pain in legs with standing [] Pain in legs when laying flat   [] Claudication    [] Pain in feet when laying flat    [] History of DVT   [] Phlebitis   [] Swelling in legs   [] Varicose veins   [] Non-healing ulcers Pulmonary:   [] Uses home oxygen   [] Productive cough   [] Hemoptysis   [] Wheeze  [] COPD   [] Asthma Neurologic:  [] Dizziness   [] Seizures  [] Blackouts [] History of stroke   [] History of TIA  [] Aphasia   [] Temporary Blindness   [] Weakness or numbness in arm   [x] Weakness or numbness in leg Musculoskeletal:   [] Joint swelling   [] Joint pain   [] Low back pain  []  History of Knee Replacement [] Arthritis [] back Surgeries  []  Spinal Stenosis    Hematologic:  [] Easy bruising  [] Easy bleeding   [] Hypercoagulable state   [x] Anemic Gastrointestinal:  [] Diarrhea   [] Vomiting  [] Gastroesophageal  reflux/heartburn   [] Difficulty swallowing. [] Abdominal pain Genitourinary:  [] Chronic kidney disease   [] Difficult urination  [] Anuric   [] Blood in urine [] Frequent urination  [] Burning with urination   [] Hematuria Skin:  [] Rashes   [] Ulcers [] Wounds Psychological:  [] History of anxiety   []  History of major depression  []  Memory Difficulties      OBJECTIVE:   Physical Exam  BP (!) 157/80 (BP Location: Left Arm)   Pulse 83   Resp 16   Gen: WD/WN, NAD Head: [place]/AT, No temporalis wasting.  Ear/Nose/Throat: Hearing grossly intact,  nares w/o erythema or drainage Eyes: PER, EOMI, sclera nonicteric.  Neck: Supple, no masses.  No JVD.  Pulmonary:  Good air movement, no use of accessory muscles.  Cardiac: RRR Vascular:  Well-healed right above-knee amputation.  Good thrill and bruit of right AV fistula Vessel Right Left  Radial Palpable Palpable   Gastrointestinal: soft, non-distended. No guarding/no peritoneal signs.  Musculoskeletal: M/S 5/5 throughout.  Right above-knee amputation Neurologic: Pain and light touch intact in extremities.  Symmetrical.  Speech is fluent. Motor exam as listed above. Psychiatric: Judgment intact, Mood & affect appropriate for pt's clinical situation. Dermatologic: No Venous rashes. No Ulcers Noted.  No changes consistent with cellulitis. Lymph : No Cervical lymphadenopathy, no lichenification or skin changes of chronic lymphedema.       ASSESSMENT AND PLAN:  1. Above knee amputation of right lower extremity (HCC) Currently at this time the patient verbally states that he strongly desires to get his first prosthesis.  This will be for his right transfemoral amputation that was done on 10/25/2018.  Currently the patient utilizes a wheelchair due to his amputation however he is eager to be able to ambulate with a cane or walker once he actually gets his prosthesis.  This will be the patient's first prosthesis.  The patient has multiple comorbidities such as  end-stage renal disease, peripheral arterial disease and diabetes however none of these should affect his ability to function with his prosthesis.  The patient will be considered a K2 level ambulator due to walking inside and outside his home using his prosthesis over low level environmental barriers.  He would also benefit from a hydraulic ankle to improve safety and stability.  We will continue to follow with the patient as we follow his other vascular issues.  2. PVD (peripheral vascular disease) ([place]) Currently patient denies any claudication-like symptoms.  He denies any wounds or rest pain of his left lower extremity.  We will have the patient return in 3 months for noninvasive studies. - VAS [name] ABI WITH/WO TBI; Future  3. End stage renal disease ([place]) Currently the patient states that his dialysis sessions are doing well.  He states that his swallowing is doing well denies any signs or symptoms of steal syndrome.  We will have the patient return in 3 months for an HDA. - VAS US DUPLEX DIALYSIS ACCESS (AVF,AVG); Future   Current Outpatient Medications on File Prior to Visit  Medication Sig Dispense Refill  . allopurinol (ZYLOPRIM) 100 MG tablet Take 100 mg by mouth daily.    [name] amLODipine (NORVASC) 5 MG tablet Take 1 tablet (5 mg total) by mouth daily. 30 tablet 0  . aspirin EC 81 MG tablet Take 1 tablet (81 mg total) by mouth daily. 150 tablet 2  . atorvastatin (LIPITOR) 10 MG tablet Take 1 tablet by mouth daily.    . clonazePAM (KLONOPIN) 0.5 MG tablet Take 1 tablet (0.5 mg total) by mouth as needed. 10 tablet 0  . clopidogrel (PLAVIX) 75 MG tablet Take 1 tablet (75 mg total) by mouth daily. 30 tablet 11  . folic acid-vitamin b complex-vitamin c-selenium-zinc (DIALYVITE) 3 MG TABS tablet Take 1 tablet by mouth daily.     [name] gabapentin (NEURONTIN) 100 MG capsule Take 1 capsule (100 mg total) by mouth at bedtime. (Patient taking differently: Take 100 mg by mouth 2 (two) times daily. )    .  losartan (COZAAR) 100 MG tablet Take 100 mg by mouth daily.    . metoprolol tartrate (  LOPRESSOR) 25 MG tablet Take 25 mg by mouth every evening.    . Multiple Vitamin (MULTIVITAMIN) LIQD Take 5 mLs by mouth daily.    . sevelamer carbonate (RENVELA) 800 MG tablet Take 2,400 mg by mouth 3 (three) times daily with meals.     [name] HYDROcodone-acetaminophen (NORCO) 5-325 MG tablet Take 1 tablet by mouth every 6 (six) hours as needed for moderate pain. (Patient not taking: Reported on 02/14/2019) 20 tablet 0   No current facility-administered medications on file prior to visit.    There are no Patient Instructions on file for this visit. No follow-ups on file.   Kris Hartmann, NP  This note was completed with Sales executive.  Any errors are purely unintentional.

## 2019-05-13 ENCOUNTER — Other Ambulatory Visit (INDEPENDENT_AMBULATORY_CARE_PROVIDER_SITE_OTHER): Payer: Self-pay | Admitting: Nurse Practitioner

## 2019-05-13 ENCOUNTER — Telehealth (INDEPENDENT_AMBULATORY_CARE_PROVIDER_SITE_OTHER): Payer: Self-pay

## 2019-05-13 DIAGNOSIS — S78111A Complete traumatic amputation at level between right hip and knee, initial encounter: Secondary | ICD-10-CM

## 2019-05-13 NOTE — Telephone Encounter (Signed)
Referral sent 

## 2019-05-13 NOTE — Telephone Encounter (Signed)
Spoke with the caregiver and let her know that a referral for physical therapy has been sent for the patient. [facility].

## 2019-05-13 NOTE — Telephone Encounter (Signed)
Johnny [name] the patient's caregiver called stating that [employer] is saying the patient needs physical therapy before being fitted for a prosthetic leg.

## 2019-05-28 ENCOUNTER — Ambulatory Visit: Payer: Medicare Other | Attending: Nurse Practitioner

## 2019-05-28 ENCOUNTER — Other Ambulatory Visit: Payer: Self-pay

## 2019-05-28 DIAGNOSIS — R2681 Unsteadiness on feet: Secondary | ICD-10-CM | POA: Insufficient documentation

## 2019-05-28 DIAGNOSIS — R262 Difficulty in walking, not elsewhere classified: Secondary | ICD-10-CM | POA: Diagnosis not present

## 2019-05-28 NOTE — Therapy (Deleted)
[place] MAIN [facility] SERVICES [address] [place], [place], [address] Phone: [phone]   Fax:  [phone]  Physical Therapy Evaluation  Patient Details  Name: Johnny Gress. MRN: [id number] Date of Birth: [birth year] Referring Provider (PT): Eulogio Ditch, NP    Encounter Date: 05/28/2019  PT End of Session - 05/28/19 1042    Visit Number  1    Number of Visits  17    Date for PT Re-Evaluation  07/23/19    Authorization Type  [employer] Medicare    Authorization Time Period  05/28/19-07/23/19    Authorization - Visit Number  1    Authorization - Number of Visits  10    PT Start Time  0906    PT Stop Time  1000    PT Time Calculation (min)  54 min    Equipment Utilized During Treatment  Gait belt    Activity Tolerance  Patient tolerated treatment well;No increased pain;Patient limited by fatigue    Behavior During Therapy  [facility] for tasks assessed/performed       Past Medical History:  Diagnosis Date  . Anemia   . Anxiety   . CHF (congestive heart failure) ([place])   . Chronic kidney disease    esrd dialysis m/w/f  . Gout   . Hyperlipidemia   . Hypertension   . Myocardial infarction ([place]) 2010   10 years ago  . 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  . Peripheral vascular disease [facility])     Past Surgical History:  Procedure Laterality Date  . A/V FISTULAGRAM Right 09/06/2018   Procedure: A/V FISTULAGRAM;  Surgeon: Algernon Huxley, MD;  Location: [place] CV LAB;  Service: Cardiovascular;  Laterality: Right;  . A/V SHUNTOGRAM Left 06/21/2017   Procedure: A/V SHUNTOGRAM;  Surgeon: Katha Cabal, MD;  Location: [place] CV LAB;  Service: Cardiovascular;  Laterality: Left;  . A/V SHUNTOGRAM N/A 10/24/2018   Procedure: A/V SHUNTOGRAM;  Surgeon: Algernon Huxley, MD;  Location: [place] CV LAB;  Service: Cardiovascular;  Laterality: N/A;  . ABOVE KNEE LEG AMPUTATION Right 2020  . AMPUTATION  Right 10/25/2018   Procedure: AMPUTATION ABOVE KNEE;  Surgeon: Algernon Huxley, MD;  Location: [facility] ORS;  Service: General;  Laterality: Right;  . APPLICATION OF WOUND VAC Right 04/11/2018   Procedure: APPLICATION OF WOUND VAC;  Surgeon: Algernon Huxley, MD;  Location: [facility] ORS;  Service: Vascular;  Laterality: Right;  . AV FISTULA PLACEMENT Left 09/18/2015   Procedure: INSERTION OF ARTERIOVENOUS (AV) GORE-TEX GRAFT ARM ( BRACH/AXILLARY GRAFT W/ INSTANT STICK GRAFT );  Surgeon: Katha Cabal, MD;  Location: [facility] ORS;  Service: Vascular;  Laterality: Left;  . AV FISTULA PLACEMENT Right 07/19/2018   Procedure: INSERTION OF ARTERIOVENOUS (AV) GORE-TEX GRAFT ARM ( BRACHIAL AXILLARY);  Surgeon: Algernon Huxley, MD;  Location: [facility] ORS;  Service: Vascular;  Laterality: Right;  . DIALYSIS FISTULA CREATION Right 10/2017   right chest perm cath  . DIALYSIS/PERMA CATHETER REMOVAL N/A 09/13/2018   Procedure: DIALYSIS/PERMA CATHETER REMOVAL;  Surgeon: Algernon Huxley, MD;  Location: [place] CV LAB;  Service: Cardiovascular;  Laterality: N/A;  . ESOPHAGOGASTRODUODENOSCOPY N/A 12/19/2017   Procedure: ESOPHAGOGASTRODUODENOSCOPY (EGD);  Surgeon: Lin Landsman, MD;  Location: [facility] ENDOSCOPY;  Service: Gastroenterology;  Laterality: N/A;  . LOWER EXTREMITY ANGIOGRAPHY Left 11/16/2017   Procedure: LOWER EXTREMITY ANGIOGRAPHY;  Surgeon: Algernon Huxley, MD;  Location: [place] CV LAB;  Service: Cardiovascular;  Laterality: Left;  . LOWER EXTREMITY ANGIOGRAPHY Right 01/18/2018   Procedure: LOWER EXTREMITY ANGIOGRAPHY;  Surgeon: Algernon Huxley, MD;  Location: [place] CV LAB;  Service: Cardiovascular;  Laterality: Right;  . LOWER EXTREMITY ANGIOGRAPHY Left 04/02/2018   Procedure: LOWER EXTREMITY ANGIOGRAPHY;  Surgeon: Algernon Huxley, MD;  Location: [place] CV LAB;  Service: Cardiovascular;  Laterality: Left;  . LOWER EXTREMITY ANGIOGRAPHY Right 04/09/2018   Procedure: Lower Extremity Angiography with possible  intervention;  Surgeon: Algernon Huxley, MD;  Location: [place] CV LAB;  Service: Cardiovascular;  Laterality: Right;  . LOWER EXTREMITY ANGIOGRAPHY Right 07/23/2018   Procedure: Lower Extremity Angiography;  Surgeon: Algernon Huxley, MD;  Location: [place] CV LAB;  Service: Cardiovascular;  Laterality: Right;  . LOWER EXTREMITY ANGIOGRAPHY Right 09/13/2018   Procedure: LOWER EXTREMITY ANGIOGRAPHY;  Surgeon: Algernon Huxley, MD;  Location: [place] CV LAB;  Service: Cardiovascular;  Laterality: Right;  . LOWER EXTREMITY VENOGRAPHY Right 09/13/2018   Procedure: LOWER EXTREMITY VENOGRAPHY;  Surgeon: Algernon Huxley, MD;  Location: [place] CV LAB;  Service: Cardiovascular;  Laterality: Right;  . PERIPHERAL VASCULAR CATHETERIZATION Left 09/01/2015   Procedure: A/V Shuntogram/Fistulagram;  Surgeon: Katha Cabal, MD;  Location: [place] CV LAB;  Service: Cardiovascular;  Laterality: Left;  . PERIPHERAL VASCULAR CATHETERIZATION N/A 09/30/2015   Procedure: A/V Shuntogram/Fistulagram with perm cathether removal;  Surgeon: Algernon Huxley, MD;  Location: [place] CV LAB;  Service: Cardiovascular;  Laterality: N/A;  . PERIPHERAL VASCULAR CATHETERIZATION Left 09/30/2015   Procedure: A/V Shunt Intervention;  Surgeon: Algernon Huxley, MD;  Location: [place] CV LAB;  Service: Cardiovascular;  Laterality: Left;  . PERIPHERAL VASCULAR CATHETERIZATION Left 12/03/2015   Procedure: Thrombectomy;  Surgeon: Algernon Huxley, MD;  Location: [place] CV LAB;  Service: Cardiovascular;  Laterality: Left;  . PERIPHERAL VASCULAR CATHETERIZATION Left 01/28/2016   Procedure: Thrombectomy;  Surgeon: Algernon Huxley, MD;  Location: [place] CV LAB;  Service: Cardiovascular;  Laterality: Left;  . PERIPHERAL VASCULAR CATHETERIZATION N/A 01/28/2016   Procedure: A/V Shuntogram/Fistulagram;  Surgeon: Algernon Huxley, MD;  Location: [place] CV LAB;  Service: Cardiovascular;  Laterality: N/A;  . SKIN SPLIT GRAFT Right  05/24/2018   Procedure: SKIN GRAFT SPLIT THICKNESS ( RIGHT CALF);  Surgeon: Algernon Huxley, MD;  Location: [facility] ORS;  Service: Vascular;  Laterality: Right;  . UPPER EXTREMITY ANGIOGRAPHY  10/24/2018   Procedure: Upper Extremity Angiography;  Surgeon: Algernon Huxley, MD;  Location: [place] CV LAB;  Service: Cardiovascular;;  . UPPER EXTREMITY ANGIOGRAPHY Right 02/14/2019   Procedure: UPPER EXTREMITY ANGIOGRAPHY;  Surgeon: Algernon Huxley, MD;  Location: [place] CV LAB;  Service: Cardiovascular;  Laterality: Right;  . WOUND DEBRIDEMENT Right 04/11/2018   Procedure: DEBRIDEMENT WOUND calf muscle and skin;  Surgeon: Algernon Huxley, MD;  Location: [facility] ORS;  Service: Vascular;  Laterality: Right;    There were no vitals filed for this visit.    Objective measurements completed on examination: See above findings.     Plan - 05/28/19 1044    Clinical Impression Statement  Pt presenting for evaluation of AKA amputation and commencement of gait training with prosthesis. Examination revealing of mild contractures about the Rt hip, tightness of short aductor group and loss of ABDCT and Extension ROM. Pt has strong and painfree hip flexion/extension but range insufficiency makes control of the prosthesis extremely challenging in weight bearing scenarios inviting in heavy trunk compensation  for safety and stability. Surgicla scar about the residual limb looks well healed, no signs of scabs, blisters, or bleeding. Silicone limb sleeve fits snuggly without excessive material, however pt arrives with limb loosely in socket, estimated 5-6 inch gap between the residual limb and end of socket visualized throughout strap hole, reduced to 2-3 inches after author assists with donning. Pt able to rise to standing with RW from slight elevated surface and modA, but needs a lot of cuing on positioning of limb, lacks hip extension force required to lock Rt knee. Up until this point pt has been fairly independent with his  transfers, ADL performance, and mobility, but he is eager to return to walking. Pt will benefit from skilled PT intervention to improve RLE ROM, RLE strength, limb/socket interface, transfers training, gait training, and balance training to improve indpeendence/safety in basic mobility, ADL, and IADL performance.    Personal Factors and Comorbidities  Age;Fitness;Past/Current Experience;Education;Transportation    Examination-Activity Limitations  Transfers;Dressing;Squat;Stairs;Stand;Bed Mobility    Examination-Participation Restrictions  Meal Prep;Cleaning;Community Activity;Driving;Yard Work;Laundry;Shop    Stability/Clinical Decision Making  Stable/Uncomplicated    Clinical Decision Making  Low    Rehab Potential  Good    PT Frequency  2x / week    PT Duration  8 weeks    PT Treatment/Interventions  ADLs/Self Care Home Management;Cryotherapy;Electrical Stimulation;DME Instruction;Gait training;Stair training;Functional mobility training;Therapeutic activities;Therapeutic exercise;Balance training;Neuromuscular re-education;Cognitive remediation;Patient/family education;Prosthetic Training;Wheelchair mobility training;Passive range of motion;Dry needling;Scar mobilization;Spinal Manipulations;Joint Manipulations    PT Next Visit Plan  Establish home stretching and strengthening HEP    PT Home Exercise Plan  deferred for time to visit 2    Consulted and Agree with Plan of Care  Patient       Patient will benefit from skilled therapeutic intervention in order to improve the following deficits and impairments:  Decreased activity tolerance, Decreased balance, Decreased mobility, Decreased strength, Decreased cognition, Decreased knowledge of use of DME, Decreased knowledge of precautions, Decreased endurance, Decreased range of motion, Difficulty walking, Decreased safety awareness, Decreased skin integrity, Decreased scar mobility, Hypomobility, Postural dysfunction, Increased edema, Increased  muscle spasms, Increased fascial restricitons, Prosthetic Dependency, Impaired tone, Improper body mechanics  Visit Diagnosis: Difficulty in walking, not elsewhere classified  Unsteadiness on feet     Problem List Patient Active Problem List   Diagnosis Date Noted  . Cardiomyopathy ([place]) 01/22/2019  . CHF (congestive heart failure) ([place]) 01/22/2019  . Coronary disease 01/22/2019  . Above knee amputation of right lower extremity ([place]) 11/13/2018  . Leg ulcer, right, with fat layer exposed ([place]) 10/22/2018  . Cellulitis of right leg 07/21/2018  . Lower limb ulcer, calf, right, limited to breakdown of skin ([place]) 06/08/2018  . PVD (peripheral vascular disease) ([place]) 04/17/2018  . Malnutrition of moderate degree 04/11/2018  . Pressure injury of skin 04/06/2018  . Altered mental status 04/04/2018  . Hypothermia 04/04/2018  . Hemodialysis graft malfunction ([place]) 03/26/2018  . Hypercholesterolemia 02/27/2018  . Diabetes ([place]) 02/27/2018  . Weakness of right lower extremity 01/20/2018  . Fever   . Periumbilical abdominal pain   . Confusion 12/22/2017  . Acute delirium 12/21/2017  . Protein-calorie malnutrition, severe 12/19/2017  . Intractable nausea and vomiting 12/18/2017  . Lymphedema 12/13/2017  . Cellulitis 11/27/2017  . Chest pain 11/19/2017  . Atherosclerosis of native arteries of the extremities with ulceration ([place]) 11/07/2017  . Twitching 01/03/2017  . Elevated troponin 10/02/2015  . Complications, dialysis, catheter, mechanical ([place]) 10/02/2015  . Musculoskeletal chest pain 09/28/2015  . Chronic  diastolic CHF (congestive heart failure) ([place]) 09/28/2015  . End stage renal disease ([place]) 10/09/2012  . Hypertension 10/09/2012  . Gout 10/09/2012    Buccola,Allan C 05/28/2019, 11:51 AM  [place] MAIN [facility] SERVICES [address] [place], [place], [address] Phone: [phone]   Fax:  [phone]  Name: Johnny Myron. MRN:  [id number] Date of Birth: 10/03/1950

## 2019-05-28 NOTE — Therapy (Signed)
[place] MAIN [facility] SERVICES [address] [place], [place], [address] Phone: [phone]   Fax:  [phone]  Physical Therapy Evaluation  Patient Details  Name: Johnny [name]. MRN: [id number] Date of Birth: 1951-04-27 Referring Provider (PT): Eulogio Ditch, NP    Encounter Date: 05/28/2019  PT End of Session - 05/28/19 1042    Visit Number  1    Number of Visits  17    Date for PT Re-Evaluation  07/23/19    Authorization Type  [employer] Medicare    Authorization Time Period  05/28/19-07/23/19    Authorization - Visit Number  1    Authorization - Number of Visits  10    PT Start Time  0906    PT Stop Time  1000    PT Time Calculation (min)  54 min    Equipment Utilized During Treatment  Gait belt    Activity Tolerance  Patient tolerated treatment well;No increased pain;Patient limited by fatigue    Behavior During Therapy  [facility] for tasks assessed/performed       Past Medical History:  Diagnosis Date  . Anemia   . Anxiety   . CHF (congestive heart failure) ([place])   . Chronic kidney disease    esrd dialysis m/w/f  . Gout   . Hyperlipidemia   . Hypertension   . Myocardial infarction ([place]) 2010   10 years ago  . 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  . Peripheral vascular disease [facility])     Past Surgical History:  Procedure Laterality Date  . A/V FISTULAGRAM Right 09/06/2018   Procedure: A/V FISTULAGRAM;  Surgeon: Algernon Huxley, MD;  Location: [place] CV LAB;  Service: Cardiovascular;  Laterality: Right;  . A/V SHUNTOGRAM Left 06/21/2017   Procedure: A/V SHUNTOGRAM;  Surgeon: Katha Cabal, MD;  Location: [place] CV LAB;  Service: Cardiovascular;  Laterality: Left;  . A/V SHUNTOGRAM N/A 10/24/2018   Procedure: A/V SHUNTOGRAM;  Surgeon: Algernon Huxley, MD;  Location: [place] CV LAB;  Service: Cardiovascular;  Laterality: N/A;  . ABOVE KNEE LEG AMPUTATION Right 2020  . AMPUTATION  Right 10/25/2018   Procedure: AMPUTATION ABOVE KNEE;  Surgeon: Algernon Huxley, MD;  Location: [facility] ORS;  Service: General;  Laterality: Right;  . APPLICATION OF WOUND VAC Right 04/11/2018   Procedure: APPLICATION OF WOUND VAC;  Surgeon: Algernon Huxley, MD;  Location: [facility] ORS;  Service: Vascular;  Laterality: Right;  . AV FISTULA PLACEMENT Left 09/18/2015   Procedure: INSERTION OF ARTERIOVENOUS (AV) GORE-TEX GRAFT ARM ( BRACH/AXILLARY GRAFT W/ INSTANT STICK GRAFT );  Surgeon: Katha Cabal, MD;  Location: [facility] ORS;  Service: Vascular;  Laterality: Left;  . AV FISTULA PLACEMENT Right 07/19/2018   Procedure: INSERTION OF ARTERIOVENOUS (AV) GORE-TEX GRAFT ARM ( BRACHIAL AXILLARY);  Surgeon: Algernon Huxley, MD;  Location: [facility] ORS;  Service: Vascular;  Laterality: Right;  . DIALYSIS FISTULA CREATION Right 10/2017   right chest perm cath  . DIALYSIS/PERMA CATHETER REMOVAL N/A 09/13/2018   Procedure: DIALYSIS/PERMA CATHETER REMOVAL;  Surgeon: Algernon Huxley, MD;  Location: [place] CV LAB;  Service: Cardiovascular;  Laterality: N/A;  . ESOPHAGOGASTRODUODENOSCOPY N/A 12/19/2017   Procedure: ESOPHAGOGASTRODUODENOSCOPY (EGD);  Surgeon: Lin Landsman, MD;  Location: [facility] ENDOSCOPY;  Service: Gastroenterology;  Laterality: N/A;  . LOWER EXTREMITY ANGIOGRAPHY Left 11/16/2017   Procedure: LOWER EXTREMITY ANGIOGRAPHY;  Surgeon: Algernon Huxley, MD;  Location: [place] CV LAB;  Service: Cardiovascular;  Laterality: Left;  . LOWER EXTREMITY ANGIOGRAPHY Right 01/18/2018   Procedure: LOWER EXTREMITY ANGIOGRAPHY;  Surgeon: Algernon Huxley, MD;  Location: [place] CV LAB;  Service: Cardiovascular;  Laterality: Right;  . LOWER EXTREMITY ANGIOGRAPHY Left 04/02/2018   Procedure: LOWER EXTREMITY ANGIOGRAPHY;  Surgeon: Algernon Huxley, MD;  Location: [place] CV LAB;  Service: Cardiovascular;  Laterality: Left;  . LOWER EXTREMITY ANGIOGRAPHY Right 04/09/2018   Procedure: Lower Extremity Angiography with possible  intervention;  Surgeon: Algernon Huxley, MD;  Location: [place] CV LAB;  Service: Cardiovascular;  Laterality: Right;  . LOWER EXTREMITY ANGIOGRAPHY Right 07/23/2018   Procedure: Lower Extremity Angiography;  Surgeon: Algernon Huxley, MD;  Location: [place] CV LAB;  Service: Cardiovascular;  Laterality: Right;  . LOWER EXTREMITY ANGIOGRAPHY Right 09/13/2018   Procedure: LOWER EXTREMITY ANGIOGRAPHY;  Surgeon: Algernon Huxley, MD;  Location: [place] CV LAB;  Service: Cardiovascular;  Laterality: Right;  . LOWER EXTREMITY VENOGRAPHY Right 09/13/2018   Procedure: LOWER EXTREMITY VENOGRAPHY;  Surgeon: Algernon Huxley, MD;  Location: [place] CV LAB;  Service: Cardiovascular;  Laterality: Right;  . PERIPHERAL VASCULAR CATHETERIZATION Left 09/01/2015   Procedure: A/V Shuntogram/Fistulagram;  Surgeon: Katha Cabal, MD;  Location: [place] CV LAB;  Service: Cardiovascular;  Laterality: Left;  . PERIPHERAL VASCULAR CATHETERIZATION N/A 09/30/2015   Procedure: A/V Shuntogram/Fistulagram with perm cathether removal;  Surgeon: Algernon Huxley, MD;  Location: [place] CV LAB;  Service: Cardiovascular;  Laterality: N/A;  . PERIPHERAL VASCULAR CATHETERIZATION Left 09/30/2015   Procedure: A/V Shunt Intervention;  Surgeon: Algernon Huxley, MD;  Location: [place] CV LAB;  Service: Cardiovascular;  Laterality: Left;  . PERIPHERAL VASCULAR CATHETERIZATION Left 12/03/2015   Procedure: Thrombectomy;  Surgeon: Algernon Huxley, MD;  Location: [place] CV LAB;  Service: Cardiovascular;  Laterality: Left;  . PERIPHERAL VASCULAR CATHETERIZATION Left 01/28/2016   Procedure: Thrombectomy;  Surgeon: Algernon Huxley, MD;  Location: [place] CV LAB;  Service: Cardiovascular;  Laterality: Left;  . PERIPHERAL VASCULAR CATHETERIZATION N/A 01/28/2016   Procedure: A/V Shuntogram/Fistulagram;  Surgeon: Algernon Huxley, MD;  Location: [place] CV LAB;  Service: Cardiovascular;  Laterality: N/A;  . SKIN SPLIT GRAFT Right  05/24/2018   Procedure: SKIN GRAFT SPLIT THICKNESS ( RIGHT CALF);  Surgeon: Algernon Huxley, MD;  Location: [facility] ORS;  Service: Vascular;  Laterality: Right;  . UPPER EXTREMITY ANGIOGRAPHY  10/24/2018   Procedure: Upper Extremity Angiography;  Surgeon: Algernon Huxley, MD;  Location: [place] CV LAB;  Service: Cardiovascular;;  . UPPER EXTREMITY ANGIOGRAPHY Right 02/14/2019   Procedure: UPPER EXTREMITY ANGIOGRAPHY;  Surgeon: Algernon Huxley, MD;  Location: [place] CV LAB;  Service: Cardiovascular;  Laterality: Right;  . WOUND DEBRIDEMENT Right 04/11/2018   Procedure: DEBRIDEMENT WOUND calf muscle and skin;  Surgeon: Algernon Huxley, MD;  Location: [facility] ORS;  Service: Vascular;  Laterality: Right;    There were no vitals filed for this visit.   Subjective Assessment - 05/28/19 1035    Subjective  Pt presenting to OPPT for evaluation and gait training with a new AKA prosthesis. Pt was fitted for a AKA prosthesis in Jan 2021, has been wearing his prosthesis daily since getting it.    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 Pt DC hospital to SNF for rehab. He has since been back at home. Pt uses a manual WC or power chair for  daily mobility. Pt takes tranportation services to HD MWF. Pt was recently fitted for his AKA prosthesis in Jan 2021.    Limitations  Standing;Walking;House hold activities    How long can you sit comfortably?  No difficulty    How long can you stand comfortably?  1 minute    How long can you walk comfortably?  unable    Currently in Pain?  No/denies         [facility] PT Assessment - 05/28/19 0001      Assessment   Medical Diagnosis  s/p Right AKA amputation    Referring Provider (PT)  Eulogio Ditch, NP     Onset Date/Surgical Date  10/25/18    Prior Therapy  Was at [facility] after AKA      Precautions   Precautions  Fall      Restrictions   Weight Bearing Restrictions  Yes      Balance Screen   Has the  patient fallen in the past 6 months  No    Has the patient had a decrease in activity level because of a fear of falling?   No    Is the patient reluctant to leave their home because of a fear of falling?   No      Home Social worker  Private residence    Living Arrangements  Alone    Available Help at Discharge  Neighbor;Friend(s)    Type of [place] Access  Elevator;Stairs to enter    Entrance Stairs-Number of Steps  [place] scooter;Grab bars - tub/shower;Wheelchair - [employer] - 2 wheels      Prior Function   Level of Independence  Independent with basic ADLs;Independent with household mobility with device;Needs assistance with homemaking;Needs assistance with gait;Independent with community mobility with device    Vocation  On disability      Cognition   Overall Cognitive Status  --   at baseline; mildlimited insight and problem solving     Observation/Other Assessments   Focus on Therapeutic Outcomes (FOTO)   FOTO: 48 (52% impaired)       Observation/Other Assessments-Edema    Edema  --   mild edema in residual limb, ~20% larger than contrlateral s     ROM / Strength   AROM / PROM / Strength  PROM;Strength      PROM   PROM Assessment Site  Hip    Right/Left Hip  Right    Right Hip Extension  -5    Right Hip Flexion  115    Right Hip ABduction  -5   unable to achieve any true ABDCT in frontal plane     Strength   Strength Assessment Site  Hip    Right/Left Hip  Right    Right Hip Flexion  5/5    Right Hip Extension  5/5   at 45* flex; unable to produce force in neutral extension   Right Hip ABduction  --   unable to test d/t contracture/ hip flexor dominant strategy     Flexibility   Soft Tissue Assessment /Muscle Length  yes    Quadriceps  --   Hip flexor tightness limting extension, pt feels in quads     Transfers   Transfers  Sit to Stand;Lateral/Scoot Transfers    Sit  to Stand  3: Mod assist;From elevated surface    Sit  to Stand Details  Manual facilitation for weight shifting;Manual facilitation for placement;Manual facilitation for weight bearing   manually faciliated prosthetic limb placement and knee lock   Lateral/Scoot Transfers  5: Supervision   cues needed for safety; Strong BUE use to elevate buttocks     Ambulation/Gait   Ambulation/Gait  --   Pt has not began gait training with prosthesis as of yewt     Balance   Balance Assessed  Yes      Static Sitting Balance   Static Sitting - Balance Support  No upper extremity supported;Feet supported    Static Sitting - Level of Assistance  7: Independent      Dynamic Sitting Balance   Dynamic Sitting - Balance Support  No upper extremity supported;Feet unsupported    Dynamic Sitting - Level of Assistance  7: Independent      Static Standing Balance   Static Standing - Balance Support  Bilateral upper extremity supported;During functional activity    Static Standing - Level of Assistance  4: Min assist;5: Stand by assistance   authro locks Rt knee, cued for weight shifting as able   Static Standing - Comment/# of Minutes  1         Objective measurements completed on examination: See above findings.     PT Education - 05/28/19 1040    Education Details  Importance of new stretching routine to improve mobility in limb    Person(s) Educated  Patient    Methods  Explanation    Comprehension  Verbalized understanding;Need further instruction       PT Short Term Goals - 05/28/19 1055      PT SHORT TERM GOAL #1   Title  Pt to demonstrate ability to perfrom STS transfer from chair c RW minGuard assist and lock his RLE knee joint without assist.    Baseline  ModA from elevated surface    Time  4    Period  Weeks    Status  New    Target Date  06/25/19      PT SHORT TERM GOAL #2   Title  Pt to demonstrate 5 degrees hip flexion 10 degrees hip abdct ROM.    Baseline  lacks 5 degrees  from neutral in both    Time  4    Period  Weeks    Status  New    Target Date  06/25/19        PT Long Term Goals - 05/28/19 1057      PT LONG TERM GOAL #1   Title  Pt to demonstrates 15 degrees Rt hip ABDCT, 10 degrees Rt hip extension P/ROM to facilitate prosthesis motor control.    Time  8    Period  Weeks    Status  New    Target Date  07/23/19      PT LONG TERM GOAL #2   Title  Pt to demonstrate 5/5 strength hip extension at 0 degrees flexion/extension    Time  8    Period  Weeks    Status  New    Target Date  07/23/19      PT LONG TERM GOAL #3   Title  Pt able to perfrom 5x STS from chair with RW, arms ad lib in <21sec    Time  8    Period  Weeks    Status  New    Target Date  07/23/19  Plan - 05/28/19 1044    Clinical Impression Statement  Pt presenting for evaluation of AKA amputation and commencement of gait training with prosthesis. Examination revealing of mild contractures about the Rt hip, tightness of short aductor group and loss of ABDCT and Extension ROM. Pt has strong and painfree hip flexion/extension but range insufficiency makes control of the prosthesis extremely challenging in weight bearing scenarios inviting in heavy trunk compensation for safety and stability. Surgicla scar about the residual limb looks well healed, no signs of scabs, blisters, or bleeding. Silicone limb sleeve fits snuggly without excessive material, however pt arrives with limb loosely in socket, estimated 5-6 inch gap between the residual limb and end of socket visualized throughout strap hole, reduced to 2-3 inches after author assists with donning. Pt able to rise to standing with RW from slight elevated surface and modA, but needs a lot of cuing on positioning of limb, lacks hip extension force required to lock Rt knee. Up until this point pt has been fairly independent with his transfers, ADL performance, and mobility, but he is eager to return to walking. Pt will  benefit from skilled PT intervention to improve RLE ROM, RLE strength, limb/socket interface, transfers training, gait training, and balance training to improve indpeendence/safety in basic mobility, ADL, and IADL performance.    Personal Factors and Comorbidities  Age;Fitness;Past/Current Experience;Education;Transportation    Examination-Activity Limitations  Transfers;Dressing;Squat;Stairs;Stand;Bed Mobility    Examination-Participation Restrictions  Meal Prep;Cleaning;Community Activity;Driving;Yard Work;Laundry;Shop    Stability/Clinical Decision Making  Stable/Uncomplicated    Clinical Decision Making  Low    Rehab Potential  Good    PT Frequency  2x / week    PT Duration  8 weeks    PT Treatment/Interventions  ADLs/Self Care Home Management;Cryotherapy;Electrical Stimulation;DME Instruction;Gait training;Stair training;Functional mobility training;Therapeutic activities;Therapeutic exercise;Balance training;Neuromuscular re-education;Cognitive remediation;Patient/family education;Prosthetic Training;Wheelchair mobility training;Passive range of motion;Dry needling;Scar mobilization;Spinal Manipulations;Joint Manipulations    PT Next Visit Plan  Establish home stretching and strengthening HEP    PT Home Exercise Plan  deferred for time to visit 2    Consulted and Agree with Plan of Care  Patient       Patient will benefit from skilled therapeutic intervention in order to improve the following deficits and impairments:  Decreased activity tolerance, Decreased balance, Decreased mobility, Decreased strength, Decreased cognition, Decreased knowledge of use of DME, Decreased knowledge of precautions, Decreased endurance, Decreased range of motion, Difficulty walking, Decreased safety awareness, Decreased skin integrity, Decreased scar mobility, Hypomobility, Postural dysfunction, Increased edema, Increased muscle spasms, Increased fascial restricitons, Prosthetic Dependency, Impaired tone, Improper  body mechanics  Visit Diagnosis: Difficulty in walking, not elsewhere classified  Unsteadiness on feet     Problem List Patient Active Problem List   Diagnosis Date Noted  . Cardiomyopathy ([place]) 01/22/2019  . CHF (congestive heart failure) ([place]) 01/22/2019  . Coronary disease 01/22/2019  . Above knee amputation of right lower extremity ([place]) 11/13/2018  . Leg ulcer, right, with fat layer exposed ([place]) 10/22/2018  . Cellulitis of right leg 07/21/2018  . Lower limb ulcer, calf, right, limited to breakdown of skin ([place]) 06/08/2018  . PVD (peripheral vascular disease) ([place]) 04/17/2018  . Malnutrition of moderate degree 04/11/2018  . Pressure injury of skin 04/06/2018  . Altered mental status 04/04/2018  . Hypothermia 04/04/2018  . Hemodialysis graft malfunction ([place]) 03/26/2018  . Hypercholesterolemia 02/27/2018  . Diabetes ([place]) 02/27/2018  . Weakness of right lower extremity 01/20/2018  . Fever   . Periumbilical abdominal pain   .  Confusion 12/22/2017  . Acute delirium 12/21/2017  . Protein-calorie malnutrition, severe 12/19/2017  . Intractable nausea and vomiting 12/18/2017  . Lymphedema 12/13/2017  . Cellulitis 11/27/2017  . Chest pain 11/19/2017  . Atherosclerosis of native arteries of the extremities with ulceration ([place]) 11/07/2017  . Twitching 01/03/2017  . Elevated troponin 10/02/2015  . Complications, dialysis, catheter, mechanical ([place]) 10/02/2015  . Musculoskeletal chest pain 09/28/2015  . Chronic diastolic CHF (congestive heart failure) ([place]) 09/28/2015  . End stage renal disease ([place]) 10/09/2012  . Hypertension 10/09/2012  . Gout 10/09/2012   11:55 AM, 05/28/19 Etta Grandchild, PT, DPT Physical Therapist - [facility]  Outpatient Physical Therapy- [place] [phone]     Etta Grandchild 05/28/2019, 11:53 AM  [place] MAIN [facility] SERVICES [address] [place], [place],  [address] Phone: [phone]   Fax:  [phone]  Name: Johnny [name]. MRN: [id number] Date of Birth: Jul 01, 1950

## 2019-05-30 ENCOUNTER — Ambulatory Visit: Payer: Medicare Other

## 2019-06-04 ENCOUNTER — Other Ambulatory Visit: Payer: Self-pay

## 2019-06-04 ENCOUNTER — Ambulatory Visit: Payer: Medicare Other

## 2019-06-04 DIAGNOSIS — R2681 Unsteadiness on feet: Secondary | ICD-10-CM

## 2019-06-04 DIAGNOSIS — R262 Difficulty in walking, not elsewhere classified: Secondary | ICD-10-CM

## 2019-06-04 NOTE — Therapy (Signed)
[place] MAIN [facility] SERVICES [address] [place], [place], [address] Phone: [phone]   Fax:  [phone]  Physical Therapy Treatment  Patient Details  Name: Johnny [name]. MRN: [id number] Date of Birth: 08-15-1950 Referring Provider (PT): Eulogio Ditch, NP    Encounter Date: 06/04/2019  PT End of Session - 06/04/19 1607    Visit Number  2    Number of Visits  17    Date for PT Re-Evaluation  07/23/19    Authorization Type  [employer] Medicare    Authorization Time Period  05/28/19-07/23/19    Authorization - Visit Number  2    Authorization - Number of Visits  10    PT Start Time  0930    PT Stop Time  1015    PT Time Calculation (min)  45 min    Equipment Utilized During Treatment  Gait belt    Activity Tolerance  Patient tolerated treatment well;No increased pain;Patient limited by fatigue    Behavior During Therapy  [facility] for tasks assessed/performed       Past Medical History:  Diagnosis Date  . Anemia   . Anxiety   . CHF (congestive heart failure) ([place])   . Chronic kidney disease    esrd dialysis m/w/f  . Gout   . Hyperlipidemia   . Hypertension   . Myocardial infarction ([place]) 2010   10 years ago  . 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  . Peripheral vascular disease [facility])     Past Surgical History:  Procedure Laterality Date  . A/V FISTULAGRAM Right 09/06/2018   Procedure: A/V FISTULAGRAM;  Surgeon: Algernon Huxley, MD;  Location: [place] CV LAB;  Service: Cardiovascular;  Laterality: Right;  . A/V SHUNTOGRAM Left 06/21/2017   Procedure: A/V SHUNTOGRAM;  Surgeon: Katha Cabal, MD;  Location: [place] CV LAB;  Service: Cardiovascular;  Laterality: Left;  . A/V SHUNTOGRAM N/A 10/24/2018   Procedure: A/V SHUNTOGRAM;  Surgeon: Algernon Huxley, MD;  Location: [place] CV LAB;  Service: Cardiovascular;  Laterality: N/A;  . ABOVE KNEE LEG AMPUTATION Right 2020  . AMPUTATION Right  10/25/2018   Procedure: AMPUTATION ABOVE KNEE;  Surgeon: Algernon Huxley, MD;  Location: [facility] ORS;  Service: General;  Laterality: Right;  . APPLICATION OF WOUND VAC Right 04/11/2018   Procedure: APPLICATION OF WOUND VAC;  Surgeon: Algernon Huxley, MD;  Location: [facility] ORS;  Service: Vascular;  Laterality: Right;  . AV FISTULA PLACEMENT Left 09/18/2015   Procedure: INSERTION OF ARTERIOVENOUS (AV) GORE-TEX GRAFT ARM ( BRACH/AXILLARY GRAFT W/ INSTANT STICK GRAFT );  Surgeon: Katha Cabal, MD;  Location: [facility] ORS;  Service: Vascular;  Laterality: Left;  . AV FISTULA PLACEMENT Right 07/19/2018   Procedure: INSERTION OF ARTERIOVENOUS (AV) GORE-TEX GRAFT ARM ( BRACHIAL AXILLARY);  Surgeon: Algernon Huxley, MD;  Location: [facility] ORS;  Service: Vascular;  Laterality: Right;  . DIALYSIS FISTULA CREATION Right 10/2017   right chest perm cath  . DIALYSIS/PERMA CATHETER REMOVAL N/A 09/13/2018   Procedure: DIALYSIS/PERMA CATHETER REMOVAL;  Surgeon: Algernon Huxley, MD;  Location: [place] CV LAB;  Service: Cardiovascular;  Laterality: N/A;  . ESOPHAGOGASTRODUODENOSCOPY N/A 12/19/2017   Procedure: ESOPHAGOGASTRODUODENOSCOPY (EGD);  Surgeon: Lin Landsman, MD;  Location: [facility] ENDOSCOPY;  Service: Gastroenterology;  Laterality: N/A;  . LOWER EXTREMITY ANGIOGRAPHY Left 11/16/2017   Procedure: LOWER EXTREMITY ANGIOGRAPHY;  Surgeon: Algernon Huxley, MD;  Location: [place] CV LAB;  Service: Cardiovascular;  Laterality: Left;  . LOWER EXTREMITY ANGIOGRAPHY Right 01/18/2018   Procedure: LOWER EXTREMITY ANGIOGRAPHY;  Surgeon: Algernon Huxley, MD;  Location: [place] CV LAB;  Service: Cardiovascular;  Laterality: Right;  . LOWER EXTREMITY ANGIOGRAPHY Left 04/02/2018   Procedure: LOWER EXTREMITY ANGIOGRAPHY;  Surgeon: Algernon Huxley, MD;  Location: [place] CV LAB;  Service: Cardiovascular;  Laterality: Left;  . LOWER EXTREMITY ANGIOGRAPHY Right 04/09/2018   Procedure: Lower Extremity Angiography with possible  intervention;  Surgeon: Algernon Huxley, MD;  Location: [place] CV LAB;  Service: Cardiovascular;  Laterality: Right;  . LOWER EXTREMITY ANGIOGRAPHY Right 07/23/2018   Procedure: Lower Extremity Angiography;  Surgeon: Algernon Huxley, MD;  Location: [place] CV LAB;  Service: Cardiovascular;  Laterality: Right;  . LOWER EXTREMITY ANGIOGRAPHY Right 09/13/2018   Procedure: LOWER EXTREMITY ANGIOGRAPHY;  Surgeon: Algernon Huxley, MD;  Location: [place] CV LAB;  Service: Cardiovascular;  Laterality: Right;  . LOWER EXTREMITY VENOGRAPHY Right 09/13/2018   Procedure: LOWER EXTREMITY VENOGRAPHY;  Surgeon: Algernon Huxley, MD;  Location: [place] CV LAB;  Service: Cardiovascular;  Laterality: Right;  . PERIPHERAL VASCULAR CATHETERIZATION Left 09/01/2015   Procedure: A/V Shuntogram/Fistulagram;  Surgeon: Katha Cabal, MD;  Location: [place] CV LAB;  Service: Cardiovascular;  Laterality: Left;  . PERIPHERAL VASCULAR CATHETERIZATION N/A 09/30/2015   Procedure: A/V Shuntogram/Fistulagram with perm cathether removal;  Surgeon: Algernon Huxley, MD;  Location: [place] CV LAB;  Service: Cardiovascular;  Laterality: N/A;  . PERIPHERAL VASCULAR CATHETERIZATION Left 09/30/2015   Procedure: A/V Shunt Intervention;  Surgeon: Algernon Huxley, MD;  Location: [place] CV LAB;  Service: Cardiovascular;  Laterality: Left;  . PERIPHERAL VASCULAR CATHETERIZATION Left 12/03/2015   Procedure: Thrombectomy;  Surgeon: Algernon Huxley, MD;  Location: [place] CV LAB;  Service: Cardiovascular;  Laterality: Left;  . PERIPHERAL VASCULAR CATHETERIZATION Left 01/28/2016   Procedure: Thrombectomy;  Surgeon: Algernon Huxley, MD;  Location: [place] CV LAB;  Service: Cardiovascular;  Laterality: Left;  . PERIPHERAL VASCULAR CATHETERIZATION N/A 01/28/2016   Procedure: A/V Shuntogram/Fistulagram;  Surgeon: Algernon Huxley, MD;  Location: [place] CV LAB;  Service: Cardiovascular;  Laterality: N/A;  . SKIN SPLIT GRAFT Right  05/24/2018   Procedure: SKIN GRAFT SPLIT THICKNESS ( RIGHT CALF);  Surgeon: Algernon Huxley, MD;  Location: [facility] ORS;  Service: Vascular;  Laterality: Right;  . UPPER EXTREMITY ANGIOGRAPHY  10/24/2018   Procedure: Upper Extremity Angiography;  Surgeon: Algernon Huxley, MD;  Location: [place] CV LAB;  Service: Cardiovascular;;  . UPPER EXTREMITY ANGIOGRAPHY Right 02/14/2019   Procedure: UPPER EXTREMITY ANGIOGRAPHY;  Surgeon: Algernon Huxley, MD;  Location: [place] CV LAB;  Service: Cardiovascular;  Laterality: Right;  . WOUND DEBRIDEMENT Right 04/11/2018   Procedure: DEBRIDEMENT WOUND calf muscle and skin;  Surgeon: Algernon Huxley, MD;  Location: [facility] ORS;  Service: Vascular;  Laterality: Right;    There were no vitals filed for this visit.  Subjective Assessment - 06/04/19 0920    Subjective  Pt reports that he is doing well today. No specific questions or concerns currently. He has been consistently wearing his R prosthetic with the liner at home.    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 Pt DC hospital to SNF for rehab. He has since been back at home. Pt uses a manual WC or power chair for daily mobility. [facility]. Pt was recently fitted for his AKA prosthesis in Jan 2021.    Limitations  Standing;Walking;House hold activities    How long can you sit comfortably?  No difficulty    How long can you stand comfortably?  1 minute    How long can you walk comfortably?  unable    Currently in Pain?  No/denies   Pt reports bilateral shoulder stiffness but no pain         TREATMENT   Ther-ex  Time spent reviewing the fit of his R prosethesis. Upon arrival pt has approximately a 6" gap between the bottom of his residual limb/liner and the end of his socket. Performed transfer with patient to standing and pulled on strap but pt still has approximately 3" between the bottom of his residual limb  and the end of the socket. He also reports discomfort at the top of his socket where it contacts his skin. Therapist will reach out to [facility] to discuss follow-up for possible modification of his prosthesis; Education about prone positioning, performed with patient with correction required to shift more weight onto R side and increase R hip extension, pt encouraged to perform at home; Prone R hip extension x 10; L sidelying R hip flexor stretch 30s hold x 3; L sidelying R ER/clam with 2 x 10; L sidelying R SLR hip abduction lift with prosthetic and assist by therapist at distal prosthesis; Supine R hip flexion stretch off edge of mat table with L SKTC to avoid anterior pelvic tilt/lumbar extension 30s hold x 2; Supine R SLR with therapist providing assistance at distal prosthesis x 10; Supine manually resisted R hip extension from flexed position with therapist providing resistance at distal prosthesis x 10; Supine R hip abduction stretch (to stretch abductors x 30s; Supine R hip IR/ER stretch x 30s each; Supine bilateral bridges with bolster under L knee and prosthetic knee x 10;   Pt educated throughout session about proper posture and technique with exercises. Improved exercise technique, movement at target joints, use of target muscles after min to mod verbal, visual, tactile cues.    Pt encouraged to perform prone positioning at home to stretch R hip flexors and improve extension. He demonstrates notable lack of R hip extension which will limit his ability to stand with his new prosthesis. He is able to perform additional supine and sidelying exercises today with notable weakness in R hip ER, abduction, and extension. He will need additional HEP which will be initiated at next session. Pt will benefit from PT services to address deficits in strength, balance, and mobility in order to return to full function at home.                        PT Short Term Goals - 05/28/19  1055      PT SHORT TERM GOAL #1   Title  Pt to demonstrate ability to perfrom STS transfer from chair c RW minGuard assist and lock his RLE knee joint without assist.    Baseline  ModA from elevated surface    Time  4    Period  Weeks    Status  New    Target Date  06/25/19      PT SHORT TERM GOAL #2   Title  Pt to demonstrate 5 degrees hip flexion 10 degrees hip abdct ROM.    Baseline  lacks 5 degrees from neutral in both    Time  4  Period  Weeks    Status  New    Target Date  06/25/19        PT Long Term Goals - 05/28/19 1057      PT LONG TERM GOAL #1   Title  Pt to demonstrates 15 degrees Rt hip ABDCT, 10 degrees Rt hip extension P/ROM to facilitate prosthesis motor control.    Time  8    Period  Weeks    Status  New    Target Date  07/23/19      PT LONG TERM GOAL #2   Title  Pt to demonstrate 5/5 strength hip extension at 0 degrees flexion/extension    Time  8    Period  Weeks    Status  New    Target Date  07/23/19      PT LONG TERM GOAL #3   Title  Pt able to perfrom 5x STS from chair with RW, arms ad lib in <21sec    Time  8    Period  Weeks    Status  New    Target Date  07/23/19            Plan - 06/04/19 1609    Clinical Impression Statement  Pt encouraged to perform prone positioning at home to stretch R hip flexors and improve extension. He demonstrates notable lack of R hip extension which will limit his ability to stand with his new prosthesis. He is able to perform additional supine and sidelying exercises today with notable weakness in R hip ER, abduction, and extension. He will need additional HEP which will be initiated at next session. Pt will benefit from PT services to address deficits in strength, balance, and mobility in order to return to full function at home.    Personal Factors and Comorbidities  Age;Fitness;Past/Current Experience;Education;Transportation    Examination-Activity Limitations  Transfers;Dressing;Squat;Stairs;Stand;Bed  Mobility    Examination-Participation Restrictions  Meal Prep;Cleaning;Community Activity;Driving;Yard Work;Laundry;Shop    Stability/Clinical Decision Making  Stable/Uncomplicated    Rehab Potential  Good    PT Frequency  2x / week    PT Duration  8 weeks    PT Treatment/Interventions  ADLs/Self Care Home Management;Cryotherapy;Electrical Stimulation;DME Instruction;Gait training;Stair training;Functional mobility training;Therapeutic activities;Therapeutic exercise;Balance training;Neuromuscular re-education;Cognitive remediation;Patient/family education;Prosthetic Training;Wheelchair mobility training;Passive range of motion;Dry needling;Scar mobilization;Spinal Manipulations;Joint Manipulations    PT Next Visit Plan  Establish home stretching and strengthening HEP    PT Home Exercise Plan  prone positioning to stretch R hip extension, will add additional HEP at next visit    Consulted and Agree with Plan of Care  Patient       Patient will benefit from skilled therapeutic intervention in order to improve the following deficits and impairments:  Decreased activity tolerance, Decreased balance, Decreased mobility, Decreased strength, Decreased cognition, Decreased knowledge of use of DME, Decreased knowledge of precautions, Decreased endurance, Decreased range of motion, Difficulty walking, Decreased safety awareness, Decreased skin integrity, Decreased scar mobility, Hypomobility, Postural dysfunction, Increased edema, Increased muscle spasms, Increased fascial restricitons, Prosthetic Dependency, Impaired tone, Improper body mechanics  Visit Diagnosis: Difficulty in walking, not elsewhere classified  Unsteadiness on feet     Problem List Patient Active Problem List   Diagnosis Date Noted  . Cardiomyopathy ([place]) 01/22/2019  . CHF (congestive heart failure) ([place]) 01/22/2019  . Coronary disease 01/22/2019  . Above knee amputation of right lower extremity ([place]) 11/13/2018  . Leg ulcer,  right, with fat layer exposed ([place]) 10/22/2018  . Cellulitis of right leg  07/21/2018  . Lower limb ulcer, calf, right, limited to breakdown of skin ([place]) 06/08/2018  . PVD (peripheral vascular disease) ([place]) 04/17/2018  . Malnutrition of moderate degree 04/11/2018  . Pressure injury of skin 04/06/2018  . Altered mental status 04/04/2018  . Hypothermia 04/04/2018  . Hemodialysis graft malfunction ([place]) 03/26/2018  . Hypercholesterolemia 02/27/2018  . Diabetes ([place]) 02/27/2018  . Weakness of right lower extremity 01/20/2018  . Fever   . Periumbilical abdominal pain   . Confusion 12/22/2017  . Acute delirium 12/21/2017  . Protein-calorie malnutrition, severe 12/19/2017  . Intractable nausea and vomiting 12/18/2017  . Lymphedema 12/13/2017  . Cellulitis 11/27/2017  . Chest pain 11/19/2017  . Atherosclerosis of native arteries of the extremities with ulceration ([place]) 11/07/2017  . Twitching 01/03/2017  . Elevated troponin 10/02/2015  . Complications, dialysis, catheter, mechanical ([place]) 10/02/2015  . Musculoskeletal chest pain 09/28/2015  . Chronic diastolic CHF (congestive heart failure) ([employer]) 09/28/2015  . End stage renal disease ([place]) 10/09/2012  . Hypertension 10/09/2012  . Gout 10/09/2012   Johnny [name] PT, DPT, GCS  Ryeleigh Santore 06/04/2019, 4:44 PM  [place] MAIN [facility] SERVICES [address] [place], [place], [address] Phone: [phone]   Fax:  [phone]  Name: Labryan Ramakrishnan. MRN: [id number] Date of Birth: 16-Sep-1950

## 2019-06-06 ENCOUNTER — Ambulatory Visit: Payer: Medicare Other

## 2019-06-06 IMAGING — CR DG FOOT COMPLETE 3+V*L*
3 series · 3 of 3 positions shown · non-contrast
Comparison: 11/19/2017

CLINICAL DATA: Left leg pain and swelling, history of left leg
revascularization

EXAM:
LEFT FOOT - COMPLETE 3+ VIEW

[foot ap]
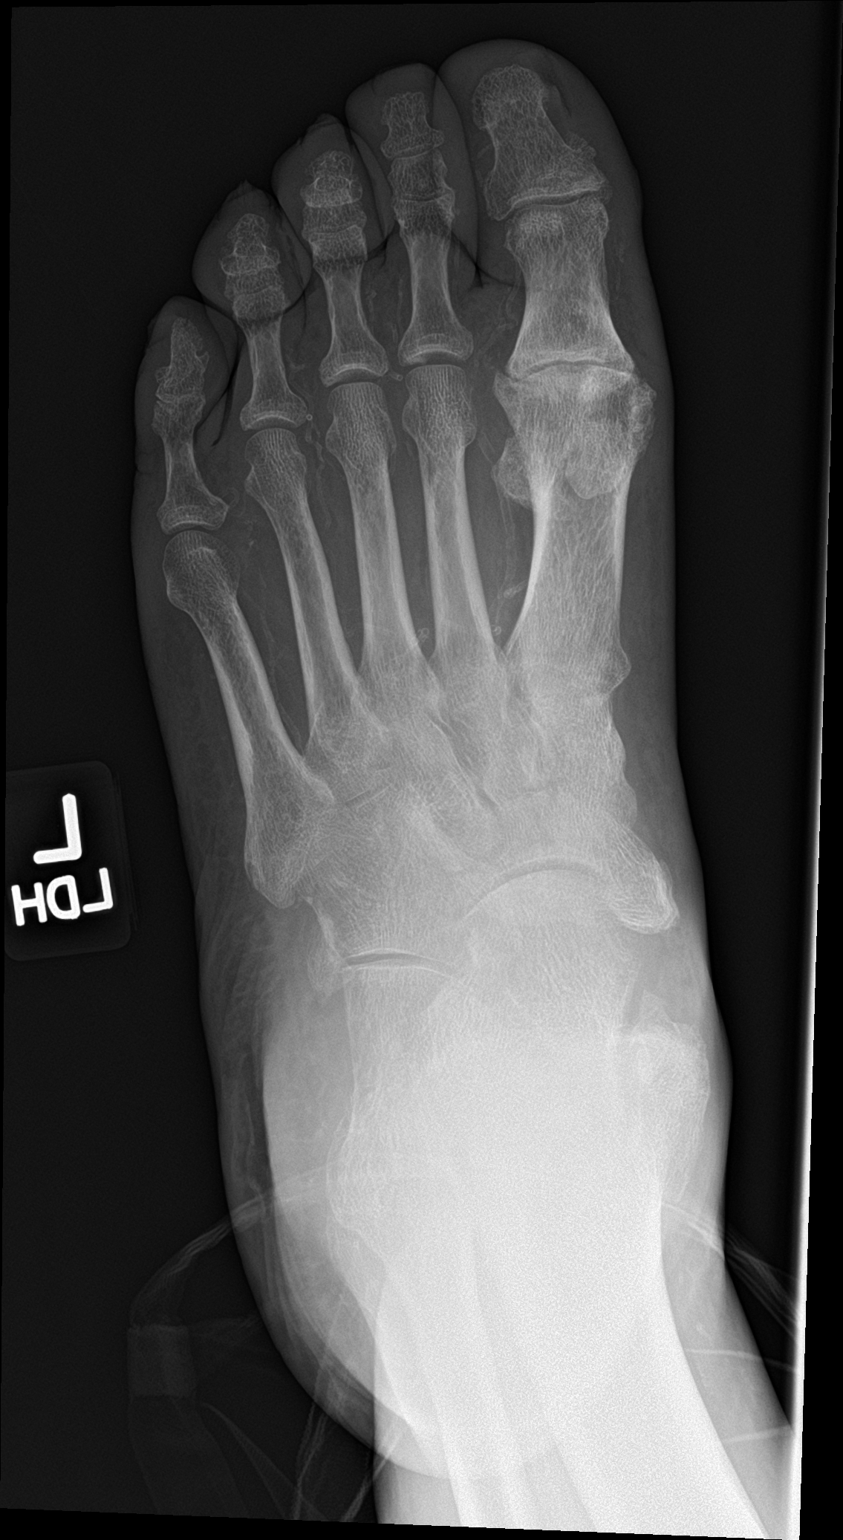

[foot obl]
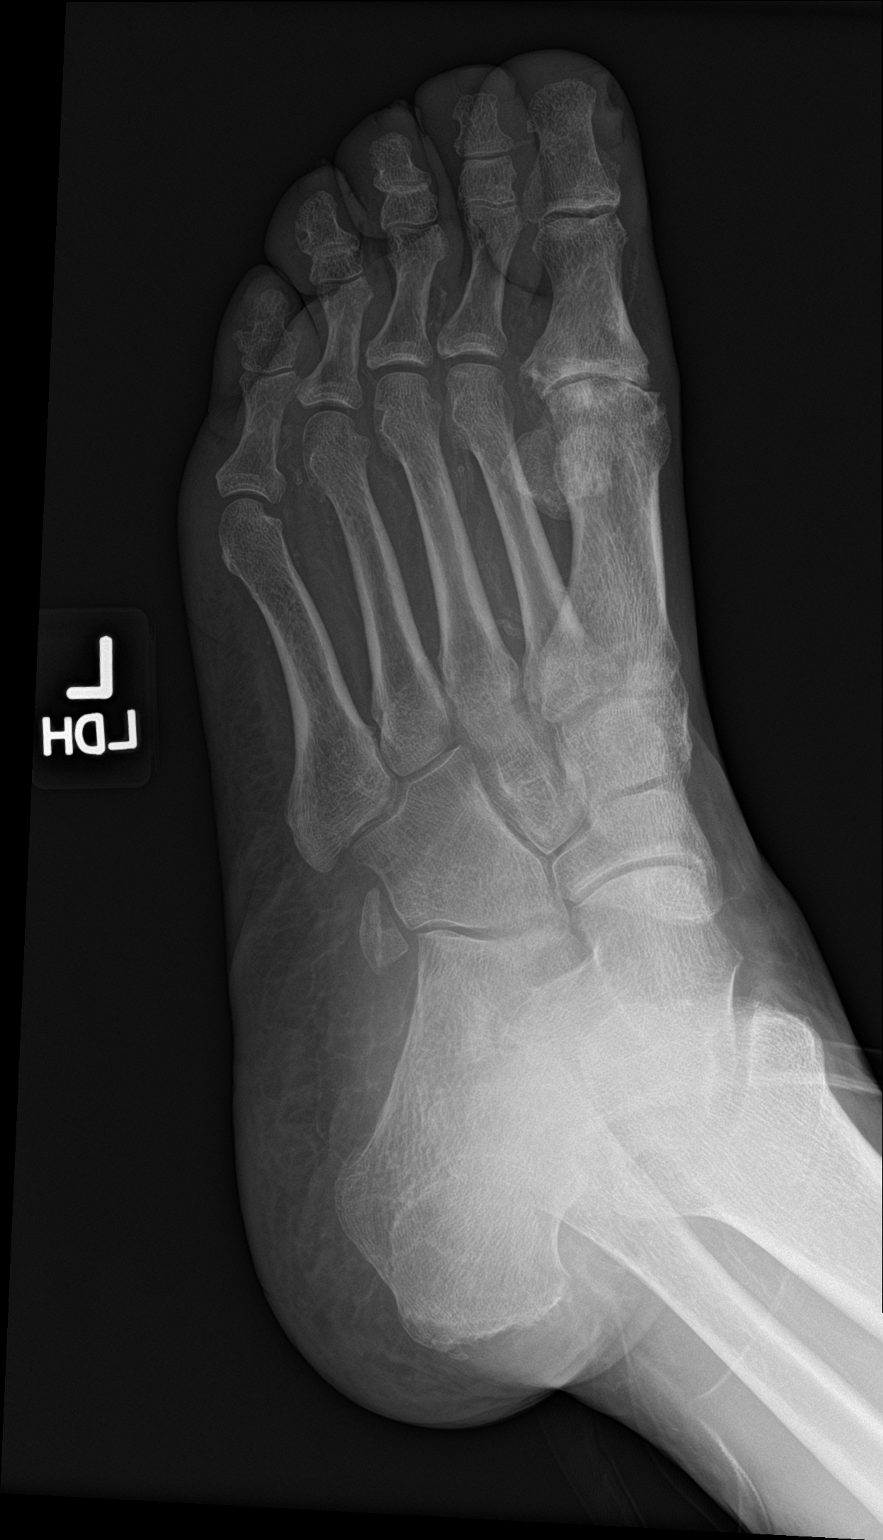

[foot lat]
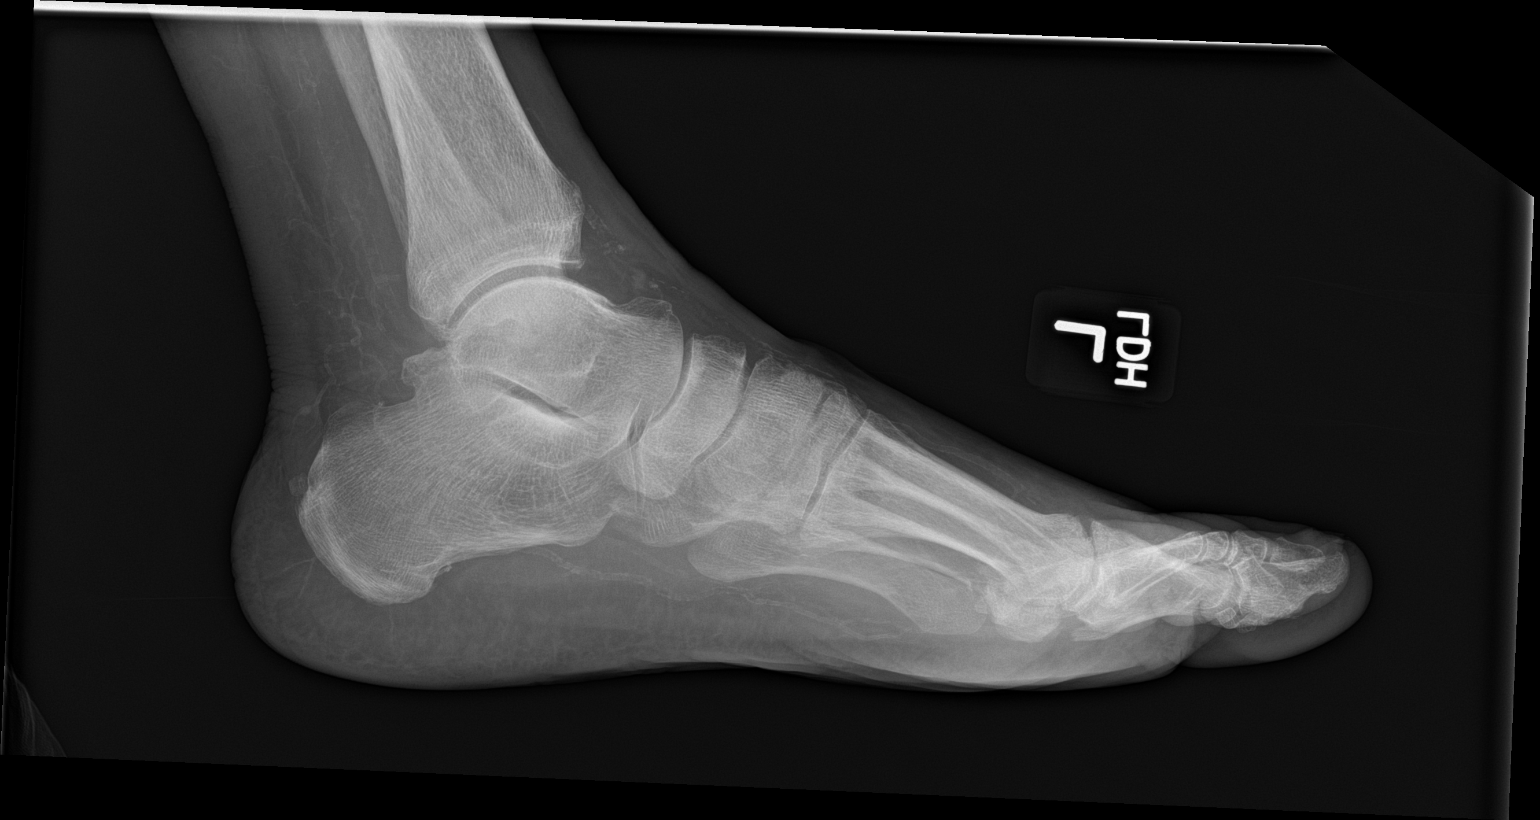

[3 of 3 positions shown; findings below may reference images not displayed]

FINDINGS: Degenerative changes of the first MTP joint are again seen. No acute
fracture or dislocation is noted. Diffuse vascular calcifications
are seen. No other gross soft tissue abnormality is noted.
IMPRESSION: Chronic changes without acute abnormality.

## 2019-06-09 IMAGING — DX DG ABDOMEN 1V
1 series · 1 of 1 positions shown · non-contrast
Comparison: None.

CLINICAL DATA: Nausea, vomiting since yesterday

EXAM:
ABDOMEN - 1 VIEW

[abdomen supine]
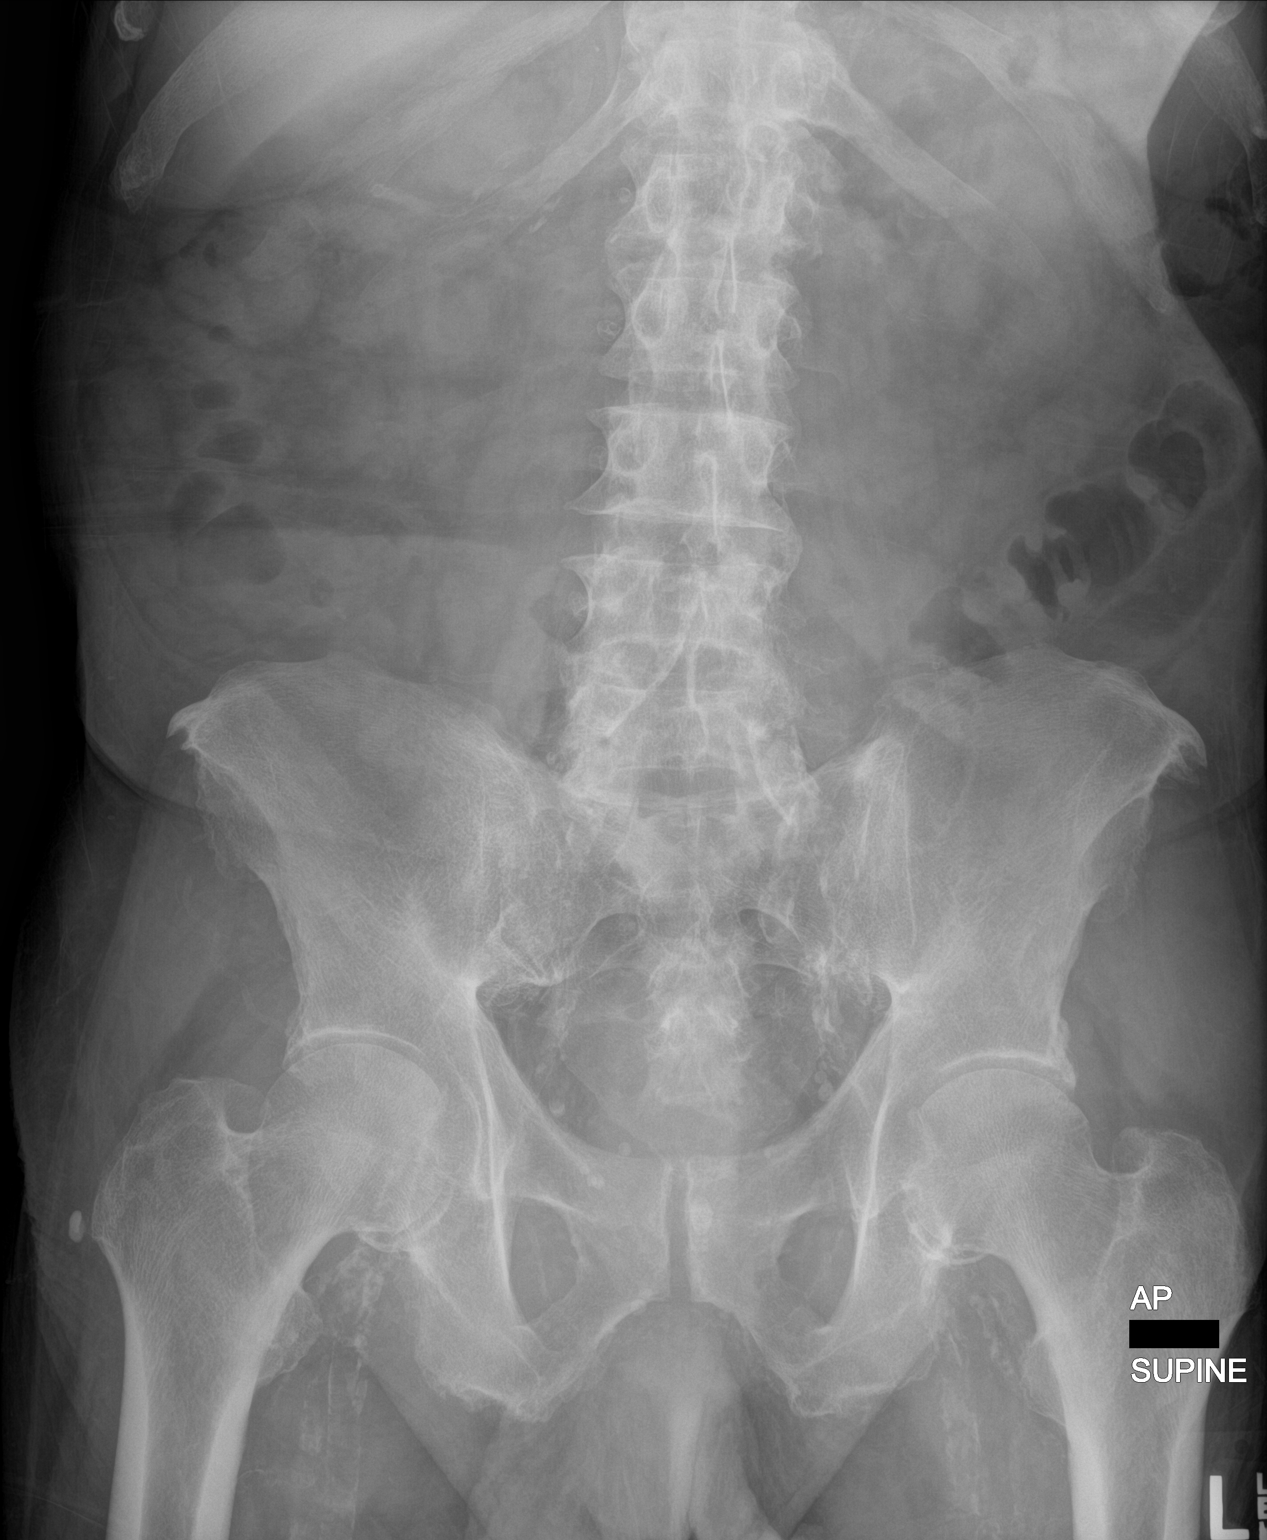

[1 of 1 positions shown; findings below may reference images not displayed]

FINDINGS: There is a non obstructive bowel gas pattern. No supine evidence of
free air. No organomegaly or suspicious calcification. No acute bony
abnormality. Vascular calcifications. No visible aneurysm.
IMPRESSION: No acute findings.  Extensive vascular calcifications.

## 2019-06-11 ENCOUNTER — Ambulatory Visit: Payer: Medicare Other | Attending: Nurse Practitioner

## 2019-06-11 ENCOUNTER — Other Ambulatory Visit: Payer: Self-pay

## 2019-06-11 DIAGNOSIS — R262 Difficulty in walking, not elsewhere classified: Secondary | ICD-10-CM | POA: Diagnosis not present

## 2019-06-11 DIAGNOSIS — R2681 Unsteadiness on feet: Secondary | ICD-10-CM | POA: Diagnosis present

## 2019-06-11 NOTE — Therapy (Signed)
[place] MAIN [facility] SERVICES [address] [place], [place], [address] Phone: [phone]   Fax:  [phone]  Physical Therapy Treatment  Patient Details  Name: Johnny [name]. MRN: [id number] Date of Birth: [birth year] Referring Provider (PT): Eulogio Ditch, NP    Encounter Date: 06/11/2019  PT End of Session - 06/11/19 0944    Visit Number  3    Number of Visits  17    Date for PT Re-Evaluation  07/23/19    Authorization Type  [employer] Medicare    Authorization Time Period  05/28/19-07/23/19    Authorization - Visit Number  3    Authorization - Number of Visits  10    PT Start Time  0927    PT Stop Time  1011    PT Time Calculation (min)  44 min    Equipment Utilized During Treatment  Gait belt    Activity Tolerance  Patient tolerated treatment well;No increased pain;Patient limited by fatigue    Behavior During Therapy  [facility] for tasks assessed/performed       Past Medical History:  Diagnosis Date  . Anemia   . Anxiety   . CHF (congestive heart failure) ([place])   . Chronic kidney disease    esrd dialysis m/w/f  . Gout   . Hyperlipidemia   . Hypertension   . Myocardial infarction ([place]) 2010   10 years ago  . 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  . Peripheral vascular disease [facility])     Past Surgical History:  Procedure Laterality Date  . A/V FISTULAGRAM Right 09/06/2018   Procedure: A/V FISTULAGRAM;  Surgeon: Algernon Huxley, MD;  Location: [place] CV LAB;  Service: Cardiovascular;  Laterality: Right;  . A/V SHUNTOGRAM Left 06/21/2017   Procedure: A/V SHUNTOGRAM;  Surgeon: Katha Cabal, MD;  Location: [place] [facility];  Service: Cardiovascular;  Laterality: Left;  . A/V SHUNTOGRAM N/A 10/24/2018   Procedure: A/V SHUNTOGRAM;  Surgeon: Algernon Huxley, MD;  Location: [place] CV LAB;  Service: Cardiovascular;  Laterality: N/A;  . ABOVE KNEE LEG AMPUTATION Right 2020  . AMPUTATION Right  10/25/2018   Procedure: AMPUTATION ABOVE KNEE;  Surgeon: Algernon Huxley, MD;  Location: [facility] ORS;  Service: General;  Laterality: Right;  . APPLICATION OF WOUND VAC Right 04/11/2018   Procedure: APPLICATION OF WOUND VAC;  Surgeon: Algernon Huxley, MD;  Location: [facility] ORS;  Service: Vascular;  Laterality: Right;  . AV FISTULA PLACEMENT Left 09/18/2015   Procedure: INSERTION OF ARTERIOVENOUS (AV) GORE-TEX GRAFT ARM ( BRACH/AXILLARY GRAFT W/ INSTANT STICK GRAFT );  Surgeon: Katha Cabal, MD;  Location: [facility] ORS;  Service: Vascular;  Laterality: Left;  . AV FISTULA PLACEMENT Right 07/19/2018   Procedure: INSERTION OF ARTERIOVENOUS (AV) GORE-TEX GRAFT ARM ( BRACHIAL AXILLARY);  Surgeon: Algernon Huxley, MD;  Location: [facility] ORS;  Service: Vascular;  Laterality: Right;  . DIALYSIS FISTULA CREATION Right 10/2017   right chest perm cath  . DIALYSIS/PERMA CATHETER REMOVAL N/A 09/13/2018   Procedure: DIALYSIS/PERMA CATHETER REMOVAL;  Surgeon: Algernon Huxley, MD;  Location: [place] CV LAB;  Service: Cardiovascular;  Laterality: N/A;  . ESOPHAGOGASTRODUODENOSCOPY N/A 12/19/2017   Procedure: ESOPHAGOGASTRODUODENOSCOPY (EGD);  Surgeon: Lin Landsman, MD;  Location: [facility] ENDOSCOPY;  Service: Gastroenterology;  Laterality: N/A;  . LOWER EXTREMITY ANGIOGRAPHY Left 11/16/2017   Procedure: LOWER EXTREMITY ANGIOGRAPHY;  Surgeon: Algernon Huxley, MD;  Location: [place] CV LAB;  Service: Cardiovascular;  Laterality: Left;  . LOWER EXTREMITY ANGIOGRAPHY Right 01/18/2018   Procedure: LOWER EXTREMITY ANGIOGRAPHY;  Surgeon: Algernon Huxley, MD;  Location: [place] [facility] LAB;  Service: Cardiovascular;  Laterality: Right;  . LOWER EXTREMITY ANGIOGRAPHY Left 04/02/2018   Procedure: LOWER EXTREMITY ANGIOGRAPHY;  Surgeon: Algernon Huxley, MD;  Location: [place] CV LAB;  Service: Cardiovascular;  Laterality: Left;  . LOWER EXTREMITY ANGIOGRAPHY Right 04/09/2018   Procedure: Lower Extremity Angiography with possible  intervention;  Surgeon: Algernon Huxley, MD;  Location: [place] [facility];  Service: Cardiovascular;  Laterality: Right;  . LOWER EXTREMITY ANGIOGRAPHY Right 07/23/2018   Procedure: Lower Extremity Angiography;  Surgeon: Algernon Huxley, MD;  Location: [place] [facility];  Service: Cardiovascular;  Laterality: Right;  . LOWER EXTREMITY ANGIOGRAPHY Right 09/13/2018   Procedure: LOWER EXTREMITY ANGIOGRAPHY;  Surgeon: Algernon Huxley, MD;  Location: [place] [facility];  Service: Cardiovascular;  Laterality: Right;  . LOWER EXTREMITY VENOGRAPHY Right 09/13/2018   Procedure: LOWER EXTREMITY VENOGRAPHY;  Surgeon: Algernon Huxley, MD;  Location: [place] [facility] LAB;  Service: Cardiovascular;  Laterality: Right;  . PERIPHERAL VASCULAR CATHETERIZATION Left 09/01/2015   Procedure: A/V Shuntogram/Fistulagram;  Surgeon: Katha Cabal, MD;  Location: [place] [facility];  Service: Cardiovascular;  Laterality: Left;  . PERIPHERAL VASCULAR CATHETERIZATION N/A 09/30/2015   Procedure: A/V Shuntogram/Fistulagram with perm cathether removal;  Surgeon: Algernon Huxley, MD;  Location: [place] [facility] LAB;  Service: Cardiovascular;  Laterality: N/A;  . PERIPHERAL VASCULAR CATHETERIZATION Left 09/30/2015   Procedure: A/V Shunt Intervention;  Surgeon: Algernon Huxley, MD;  Location: [place] [facility] LAB;  Service: Cardiovascular;  Laterality: Left;  . PERIPHERAL VASCULAR CATHETERIZATION Left 12/03/2015   Procedure: Thrombectomy;  Surgeon: Algernon Huxley, MD;  Location: [place] [facility];  Service: Cardiovascular;  Laterality: Left;  . PERIPHERAL VASCULAR CATHETERIZATION Left 01/28/2016   Procedure: Thrombectomy;  Surgeon: Algernon Huxley, MD;  Location: [place] [facility];  Service: Cardiovascular;  Laterality: Left;  . PERIPHERAL VASCULAR CATHETERIZATION N/A 01/28/2016   Procedure: A/V Shuntogram/Fistulagram;  Surgeon: Algernon Huxley, MD;  Location: [place] [facility];  Service: Cardiovascular;  Laterality: N/A;  . SKIN SPLIT GRAFT Right  05/24/2018   Procedure: SKIN GRAFT SPLIT THICKNESS ( RIGHT CALF);  Surgeon: Algernon Huxley, MD;  Location: [facility] ORS;  Service: Vascular;  Laterality: Right;  . UPPER EXTREMITY ANGIOGRAPHY  10/24/2018   Procedure: Upper Extremity Angiography;  Surgeon: Algernon Huxley, MD;  Location: [place] [facility] LAB;  Service: Cardiovascular;;  . UPPER EXTREMITY ANGIOGRAPHY Right 02/14/2019   Procedure: UPPER EXTREMITY ANGIOGRAPHY;  Surgeon: Algernon Huxley, MD;  Location: [place] [facility];  Service: Cardiovascular;  Laterality: Right;  . WOUND DEBRIDEMENT Right 04/11/2018   Procedure: DEBRIDEMENT WOUND calf muscle and skin;  Surgeon: Algernon Huxley, MD;  Location: [facility] ORS;  Service: Vascular;  Laterality: Right;    There were no vitals filed for this visit.  Subjective Assessment - 06/11/19 0941    Subjective  Patient reports compliance with wearing shrinker at home. No falls or LOB, still waiting to hear from prosthetist.    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 Pt DC hospital to SNF for rehab. He has since been back at home. Pt uses a manual WC or power chair for daily mobility. Pt takes tranportation services to HD MWF. Pt was recently fitted for his AKA  prosthesis in Jan 2021.    Limitations  Standing;Walking;House hold activities    How long can you sit comfortably?  No difficulty    How long can you stand comfortably?  1 minute    How long can you walk comfortably?  unable    Currently in Pain?  No/denies       Slide transition from power chair to plinth with mod I Supine <.sit requires min/mod A for prosthetic limb.   Prone  hip flexor stretch with PT overpressure RLE 2x 45 seconds.  Education about prone positioning, performed with patient with correction required to shift more weight onto R side and increase R hip extension, pt encouraged to perform at home;  Supine  Bridge: 10x with PT stabilization to feet for stability,  cueing for gluteal activation  RTB abduction single limb at a time 12x ; 2 sets each LE, one LE at a time  RTB marching, cueing for amplitude of movement to hand , challenging for prosthetic RLE. 12x each LE 2 sets.  Straight leg abduction 10x each LE, 2 sets  TrA activation pressing swiss ball between knees and hands 10x 3 second holds x 2 sets  hooklying adduction ball squeeze 10x 3 second holds, 2 sets   seated:  marching to PT hand 10x each LE, use of arms on plinth table for support 4lb bar: row 15x, straight arm raise 15x     Pt educated throughout session about proper posture and technique with exercises. Improved exercise technique, movement at target joints, use of target muscles after min to mod verbal, visual, tactile cues.                        PT Education - 06/11/19 0943    Education Details  exercise technique, body mechanics,    Person(s) Educated  Patient    Methods  Explanation;Demonstration;Tactile cues;Verbal cues    Comprehension  Verbalized understanding;Returned demonstration;Verbal cues required;Tactile cues required       PT Short Term Goals - 05/28/19 1055      PT SHORT TERM GOAL #1   Title  Pt to demonstrate ability to perfrom STS transfer from chair c RW minGuard assist and lock his RLE knee joint without assist.    Baseline  ModA from elevated surface    Time  4    Period  Weeks    Status  New    Target Date  06/25/19      PT SHORT TERM GOAL #2   Title  Pt to demonstrate 5 degrees hip flexion 10 degrees hip abdct ROM.    Baseline  lacks 5 degrees from neutral in both    Time  4    Period  Weeks    Status  New    Target Date  06/25/19        PT Long Term Goals - 05/28/19 1057      PT LONG TERM GOAL #1   Title  Pt to demonstrates 15 degrees Rt hip ABDCT, 10 degrees Rt hip extension P/ROM to facilitate prosthesis motor control.    Time  8    Period  Weeks    Status  New    Target Date  07/23/19      PT LONG TERM GOAL #2    Title  Pt to demonstrate 5/5 strength hip extension at 0 degrees flexion/extension    Time  8    Period  Weeks  Status  New    Target Date  07/23/19      PT LONG TERM GOAL #3   Title  Pt able to perfrom 5x STS from chair with RW, arms ad lib in <21sec    Time  8    Period  Weeks    Status  New    Target Date  07/23/19            Plan - 06/11/19 1018    Clinical Impression Statement  Patient tolerated strengthening interventions in all positions with good motivation throughout session. Patient challenged with stabilization of trunk at this time with need for UE support when moving LE's. Patient is unable to perform standing tasks at this time due to improper prosthetic fit. Waiting for prosthetist call back to primary therapist for fitting. Pt will benefit from PT services to address deficits in strength, balance, and mobility in order to return to full function at home.    Personal Factors and Comorbidities  Age;Fitness;Past/Current Experience;Education;Transportation    Examination-Activity Limitations  Transfers;Dressing;Squat;Stairs;Stand;Bed Mobility    Examination-Participation Restrictions  Meal Prep;Cleaning;Community Activity;Driving;Yard Work;Laundry;Shop    Stability/Clinical Decision Making  Stable/Uncomplicated    Rehab Potential  Good    PT Frequency  2x / week    PT Duration  8 weeks    PT Treatment/Interventions  ADLs/Self Care Home Management;Cryotherapy;Electrical Stimulation;DME Instruction;Gait training;Stair training;Functional mobility training;Therapeutic activities;Therapeutic exercise;Balance training;Neuromuscular re-education;Cognitive remediation;Patient/family education;Prosthetic Training;Wheelchair mobility training;Passive range of motion;Dry needling;Scar mobilization;Spinal Manipulations;Joint Manipulations    PT Next Visit Plan  Establish home stretching and strengthening HEP    PT Home Exercise Plan  prone positioning to stretch R hip extension,  will add additional HEP at next visit    Consulted and Agree with Plan of Care  Patient       Patient will benefit from skilled therapeutic intervention in order to improve the following deficits and impairments:  Decreased activity tolerance, Decreased balance, Decreased mobility, Decreased strength, Decreased cognition, Decreased knowledge of use of DME, Decreased knowledge of precautions, Decreased endurance, Decreased range of motion, Difficulty walking, Decreased safety awareness, Decreased skin integrity, Decreased scar mobility, Hypomobility, Postural dysfunction, Increased edema, Increased muscle spasms, Increased fascial restricitons, Prosthetic Dependency, Impaired tone, Improper body mechanics  Visit Diagnosis: Difficulty in walking, not elsewhere classified  Unsteadiness on feet     Problem List Patient Active Problem List   Diagnosis Date Noted  . Cardiomyopathy ([place]) 01/22/2019  . CHF (congestive heart failure) ([place]) 01/22/2019  . Coronary disease 01/22/2019  . Above knee amputation of right lower extremity ([place]) 11/13/2018  . Leg ulcer, right, with fat layer exposed ([place]) 10/22/2018  . Cellulitis of right leg 07/21/2018  . Lower limb ulcer, calf, right, limited to breakdown of skin ([place]) 06/08/2018  . PVD (peripheral vascular disease) ([place]) 04/17/2018  . Malnutrition of moderate degree 04/11/2018  . Pressure injury of skin 04/06/2018  . Altered mental status 04/04/2018  . Hypothermia 04/04/2018  . Hemodialysis graft malfunction ([place]) 03/26/2018  . Hypercholesterolemia 02/27/2018  . Diabetes ([place]) 02/27/2018  . Weakness of right lower extremity 01/20/2018  . Fever   . Periumbilical abdominal pain   . Confusion 12/22/2017  . Acute delirium 12/21/2017  . Protein-calorie malnutrition, severe 12/19/2017  . Intractable nausea and vomiting 12/18/2017  . Lymphedema 12/13/2017  . Cellulitis 11/27/2017  . Chest pain 11/19/2017  . Atherosclerosis of native arteries of  the extremities with ulceration ([place]) 11/07/2017  . Twitching 01/03/2017  . Elevated troponin 10/02/2015  . Complications, dialysis,  catheter, mechanical ([place]) 10/02/2015  . Musculoskeletal chest pain 09/28/2015  . Chronic diastolic CHF (congestive heart failure) ([place]) 09/28/2015  . End stage renal disease ([place]) 10/09/2012  . Hypertension 10/09/2012  . Gout 10/09/2012   Janna Arch, PT, DPT   06/11/2019, 10:21 AM  [place] MAIN [facility] SERVICES [address] [place], [place], [address] Phone: [phone]   Fax:  [phone]  Name: Johnny [name]. MRN: [id number] Date of Birth: April 14, 1951

## 2019-06-13 ENCOUNTER — Other Ambulatory Visit: Payer: Self-pay

## 2019-06-13 ENCOUNTER — Ambulatory Visit: Payer: Medicare Other

## 2019-06-13 DIAGNOSIS — R262 Difficulty in walking, not elsewhere classified: Secondary | ICD-10-CM

## 2019-06-13 DIAGNOSIS — R2681 Unsteadiness on feet: Secondary | ICD-10-CM

## 2019-06-13 NOTE — Therapy (Signed)
[place] MAIN [facility] SERVICES [address] [place], [place], [address] Phone: [phone]   Fax:  [phone]  Physical Therapy Treatment  Patient Details  Name: Johnny [name]. MRN: [id number] Date of Birth: June 08, 1950 Referring Provider (PT): Eulogio Ditch, NP    Encounter Date: 06/13/2019  PT End of Session - 06/13/19 1326    Visit Number  4    Number of Visits  17    Date for PT Re-Evaluation  07/23/19    Authorization Type  [employer] Medicare    Authorization Time Period  05/28/19-07/23/19    Authorization - Visit Number  4    Authorization - Number of Visits  10    PT Start Time  [id number]    PT Stop Time  0943    PT Time Calculation (min)  44 min    Equipment Utilized During Treatment  Gait belt    Activity Tolerance  Patient tolerated treatment well;No increased pain;Patient limited by fatigue    Behavior During Therapy  [facility] for tasks assessed/performed       Past Medical History:  Diagnosis Date  . Anemia   . Anxiety   . CHF (congestive heart failure) ([place])   . Chronic kidney disease    esrd dialysis m/w/f  . Gout   . Hyperlipidemia   . Hypertension   . Myocardial infarction ([place]) 2010   10 years ago  . 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  . Peripheral vascular disease [facility])     Past Surgical History:  Procedure Laterality Date  . A/V FISTULAGRAM Right 09/06/2018   Procedure: A/V FISTULAGRAM;  Surgeon: Algernon Huxley, MD;  Location: [place] [facility];  Service: Cardiovascular;  Laterality: Right;  . A/V SHUNTOGRAM Left 06/21/2017   Procedure: A/V SHUNTOGRAM;  Surgeon: Katha Cabal, MD;  Location: [place] [facility];  Service: Cardiovascular;  Laterality: Left;  . A/V SHUNTOGRAM N/A 10/24/2018   Procedure: A/V SHUNTOGRAM;  Surgeon: Algernon Huxley, MD;  Location: [place] [facility];  Service: Cardiovascular;  Laterality: N/A;  . ABOVE KNEE LEG AMPUTATION Right 2020  . AMPUTATION Right  10/25/2018   Procedure: AMPUTATION ABOVE KNEE;  Surgeon: Algernon Huxley, MD;  Location: [facility] ORS;  Service: General;  Laterality: Right;  . APPLICATION OF WOUND VAC Right 04/11/2018   Procedure: APPLICATION OF WOUND VAC;  Surgeon: Algernon Huxley, MD;  Location: [facility] ORS;  Service: Vascular;  Laterality: Right;  . AV FISTULA PLACEMENT Left 09/18/2015   Procedure: INSERTION OF ARTERIOVENOUS (AV) GORE-TEX GRAFT ARM ( BRACH/AXILLARY GRAFT W/ INSTANT STICK GRAFT );  Surgeon: Katha Cabal, MD;  Location: [facility] ORS;  Service: Vascular;  Laterality: Left;  . AV FISTULA PLACEMENT Right 07/19/2018   Procedure: INSERTION OF ARTERIOVENOUS (AV) GORE-TEX GRAFT ARM ( BRACHIAL AXILLARY);  Surgeon: Algernon Huxley, MD;  Location: [facility] ORS;  Service: Vascular;  Laterality: Right;  . DIALYSIS FISTULA CREATION Right 10/2017   right chest perm cath  . DIALYSIS/PERMA CATHETER REMOVAL N/A 09/13/2018   Procedure: DIALYSIS/PERMA CATHETER REMOVAL;  Surgeon: Algernon Huxley, MD;  Location: [place] [facility];  Service: Cardiovascular;  Laterality: N/A;  . ESOPHAGOGASTRODUODENOSCOPY N/A 12/19/2017   Procedure: ESOPHAGOGASTRODUODENOSCOPY (EGD);  Surgeon: Lin Landsman, MD;  Location: [facility] ENDOSCOPY;  Service: Gastroenterology;  Laterality: N/A;  . LOWER EXTREMITY ANGIOGRAPHY Left 11/16/2017   Procedure: LOWER EXTREMITY ANGIOGRAPHY;  Surgeon: Algernon Huxley, MD;  Location: [place] [facility] LAB;  Service: Cardiovascular;  Laterality: Left;  . LOWER EXTREMITY ANGIOGRAPHY Right 01/18/2018   Procedure: LOWER EXTREMITY ANGIOGRAPHY;  Surgeon: Algernon Huxley, MD;  Location: [place] [facility];  Service: Cardiovascular;  Laterality: Right;  . LOWER EXTREMITY ANGIOGRAPHY Left 04/02/2018   Procedure: LOWER EXTREMITY ANGIOGRAPHY;  Surgeon: Algernon Huxley, MD;  Location: [place] [facility];  Service: Cardiovascular;  Laterality: Left;  . LOWER EXTREMITY ANGIOGRAPHY Right 04/09/2018   Procedure: Lower Extremity Angiography with possible  intervention;  Surgeon: Algernon Huxley, MD;  Location: [place] [facility];  Service: Cardiovascular;  Laterality: Right;  . LOWER EXTREMITY ANGIOGRAPHY Right 07/23/2018   Procedure: Lower Extremity Angiography;  Surgeon: Algernon Huxley, MD;  Location: [place] [facility];  Service: Cardiovascular;  Laterality: Right;  . LOWER EXTREMITY ANGIOGRAPHY Right 09/13/2018   Procedure: LOWER EXTREMITY ANGIOGRAPHY;  Surgeon: Algernon Huxley, MD;  Location: [place] [facility];  Service: Cardiovascular;  Laterality: Right;  . LOWER EXTREMITY VENOGRAPHY Right 09/13/2018   Procedure: LOWER EXTREMITY VENOGRAPHY;  Surgeon: Algernon Huxley, MD;  Location: [place] [facility];  Service: Cardiovascular;  Laterality: Right;  . PERIPHERAL VASCULAR CATHETERIZATION Left 09/01/2015   Procedure: A/V Shuntogram/Fistulagram;  Surgeon: Katha Cabal, MD;  Location: [place] [facility];  Service: Cardiovascular;  Laterality: Left;  . PERIPHERAL VASCULAR CATHETERIZATION N/A 09/30/2015   Procedure: A/V Shuntogram/Fistulagram with perm cathether removal;  Surgeon: Algernon Huxley, MD;  Location: [place] [facility];  Service: Cardiovascular;  Laterality: N/A;  . PERIPHERAL VASCULAR CATHETERIZATION Left 09/30/2015   Procedure: A/V Shunt Intervention;  Surgeon: Algernon Huxley, MD;  Location: [place] [facility];  Service: Cardiovascular;  Laterality: Left;  . PERIPHERAL VASCULAR CATHETERIZATION Left 12/03/2015   Procedure: Thrombectomy;  Surgeon: Algernon Huxley, MD;  Location: [place] [facility];  Service: Cardiovascular;  Laterality: Left;  . PERIPHERAL VASCULAR CATHETERIZATION Left 01/28/2016   Procedure: Thrombectomy;  Surgeon: Algernon Huxley, MD;  Location: [place] [facility];  Service: Cardiovascular;  Laterality: Left;  . PERIPHERAL VASCULAR CATHETERIZATION N/A 01/28/2016   Procedure: A/V Shuntogram/Fistulagram;  Surgeon: Algernon Huxley, MD;  Location: [place] [facility];  Service: Cardiovascular;  Laterality: N/A;  . SKIN SPLIT GRAFT Right  05/24/2018   Procedure: SKIN GRAFT SPLIT THICKNESS ( RIGHT CALF);  Surgeon: Algernon Huxley, MD;  Location: [facility] ORS;  Service: Vascular;  Laterality: Right;  . UPPER EXTREMITY ANGIOGRAPHY  10/24/2018   Procedure: Upper Extremity Angiography;  Surgeon: Algernon Huxley, MD;  Location: [place] [facility];  Service: Cardiovascular;;  . UPPER EXTREMITY ANGIOGRAPHY Right 02/14/2019   Procedure: UPPER EXTREMITY ANGIOGRAPHY;  Surgeon: Algernon Huxley, MD;  Location: [place] [facility];  Service: Cardiovascular;  Laterality: Right;  . WOUND DEBRIDEMENT Right 04/11/2018   Procedure: DEBRIDEMENT WOUND calf muscle and skin;  Surgeon: Algernon Huxley, MD;  Location: [facility] ORS;  Service: Vascular;  Laterality: Right;    There were no vitals filed for this visit.  Subjective Assessment - 06/13/19 1322    Subjective  Patient reports he is wearing his shrinker at home. No falls or LOB since last session. Has been doing some of his exercises.    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 Pt DC hospital to SNF for rehab. He has since been back at home. Pt uses a manual WC or power chair for daily mobility. Pt takes tranportation services to HD MWF. Pt was  recently fitted for his AKA prosthesis in Jan 2021.    Limitations  Standing;Walking;House hold activities    How long can you sit comfortably?  No difficulty    How long can you stand comfortably?  1 minute    How long can you walk comfortably?  unable    Currently in Pain?  No/denies             Slide transition from power chair to plinth with mod I Supine <.sit requires min/mod A for prosthetic limb.    sidelying  hip flexor stretch with PT overpressure RLE 2x 60 seconds.  Hip extension/flexion arc with PT stabilization at hips and assistance at prosthetic foot 12x; 2 sets Clamshells with PT assistance for stabilization 12x 2 sets .     Supine  Bridge: 15x with PT stabilization to feet for  stability, cueing for gluteal activation  RTB abduction 15x ; 2 sets; second set GTB Rainbow ball adduction squeezes 12x 3 second holds marching, cueing for amplitude of movement to hand challenging for prosthetic RLE. 12x each LE 2 sets.  Straight leg abduction 10x each LE, 2 sets    seated:  marching to PT hand 10x each LE, use of arms on plinth table for support Modified windmills 8x each side airex pad under LLE to neutralize pelvis due to lengthened prosthetic side from improper fit.  -RTB abduction 12x -rainbow ball adduction 10x 3 second holds  -hamstring isometric RLE into plinth table 12x 3 second holds with PT cueing for timing/sequencing  Patient's prosthetist reached out to main therapist and informed them that they would call patient for new fitting of prosthesis, message sent on with this therapist to patient with patient verbalizing understanding.    Pt educated throughout session about proper posture and technique with exercises. Improved exercise technique, movement at target joints, use of target muscles after min to mod verbal, visual, tactile cues.      .              PT Education - 06/13/19 1326    Education Details  exercise technique, body mechanics    Person(s) Educated  Patient    Methods  Explanation;Demonstration;Tactile cues;Verbal cues    Comprehension  Verbalized understanding;Returned demonstration;Verbal cues required;Tactile cues required       PT Short Term Goals - 05/28/19 1055      PT SHORT TERM GOAL #1   Title  Pt to demonstrate ability to perfrom STS transfer from chair c RW minGuard assist and lock his RLE knee joint without assist.    Baseline  ModA from elevated surface    Time  4    Period  Weeks    Status  New    Target Date  06/25/19      PT SHORT TERM GOAL #2   Title  Pt to demonstrate 5 degrees hip flexion 10 degrees hip abdct ROM.    Baseline  lacks 5 degrees from neutral in both    Time  4    Period  Weeks     Status  New    Target Date  06/25/19        PT Long Term Goals - 05/28/19 1057      PT LONG TERM GOAL #1   Title  Pt to demonstrates 15 degrees Rt hip ABDCT, 10 degrees Rt hip extension P/ROM to facilitate prosthesis motor control.    Time  8    Period  Weeks    Status  New    Target Date  07/23/19      PT LONG TERM GOAL #2   Title  Pt to demonstrate 5/5 strength hip extension at 0 degrees flexion/extension    Time  8    Period  Weeks    Status  New    Target Date  07/23/19      PT LONG TERM GOAL #3   Title  Pt able to perfrom 5x STS from chair with RW, arms ad lib in <21sec    Time  8    Period  Weeks    Status  New    Target Date  07/23/19            Plan - 06/13/19 1330    Clinical Impression Statement  Patient session continues to be limited by improper fit of prosthetic. Prosthetist conferred with main therapist and will be contacting patient for new fitting to allow progression of rehab. Prolonged muscle recruitment is challenging for patient with fatigue noted in residual limb. Patient educated on need to wear clothing that is more functional for physical therapy to allow for assessment of prosthesis and residual limb; patient agreeable. Pt will benefit from PT services to address deficits in strength, balance, and mobility in order to return to full function at home    Personal Factors and Comorbidities  Age;Fitness;Past/Current Experience;Education;Transportation    Examination-Activity Limitations  Transfers;Dressing;Squat;Stairs;Stand;Bed Mobility    Examination-Participation Restrictions  Meal Prep;Cleaning;Community Activity;Driving;Yard Work;Laundry;Shop    Stability/Clinical Decision Making  Stable/Uncomplicated    Rehab Potential  Good    PT Frequency  2x / week    PT Duration  8 weeks    PT Treatment/Interventions  ADLs/Self Care Home Management;Cryotherapy;Electrical Stimulation;DME Instruction;Gait training;Stair training;Functional mobility  training;Therapeutic activities;Therapeutic exercise;Balance training;Neuromuscular re-education;Cognitive remediation;Patient/family education;Prosthetic Training;Wheelchair mobility training;Passive range of motion;Dry needling;Scar mobilization;Spinal Manipulations;Joint Manipulations    PT Next Visit Plan  Establish home stretching and strengthening HEP    PT Home Exercise Plan  prone positioning to stretch R hip extension, will add additional HEP at next visit    Consulted and Agree with Plan of Care  Patient       Patient will benefit from skilled therapeutic intervention in order to improve the following deficits and impairments:  Decreased activity tolerance, Decreased balance, Decreased mobility, Decreased strength, Decreased cognition, Decreased knowledge of use of DME, Decreased knowledge of precautions, Decreased endurance, Decreased range of motion, Difficulty walking, Decreased safety awareness, Decreased skin integrity, Decreased scar mobility, Hypomobility, Postural dysfunction, Increased edema, Increased muscle spasms, Increased fascial restricitons, Prosthetic Dependency, Impaired tone, Improper body mechanics  Visit Diagnosis: Difficulty in walking, not elsewhere classified  Unsteadiness on feet     Problem List Patient Active Problem List   Diagnosis Date Noted  . Cardiomyopathy ([place]) 01/22/2019  . CHF (congestive heart failure) ([place]) 01/22/2019  . Coronary disease 01/22/2019  . Above knee amputation of right lower extremity ([place]) 11/13/2018  . Leg ulcer, right, with fat layer exposed ([place]) 10/22/2018  . Cellulitis of right leg 07/21/2018  . Lower limb ulcer, calf, right, limited to breakdown of skin ([place]) 06/08/2018  . PVD (peripheral vascular disease) ([place]) 04/17/2018  . Malnutrition of moderate degree 04/11/2018  . Pressure injury of skin 04/06/2018  . Altered mental status 04/04/2018  . Hypothermia 04/04/2018  . Hemodialysis graft malfunction ([place]) 03/26/2018   . Hypercholesterolemia 02/27/2018  . Diabetes ([place]) 02/27/2018  . Weakness of right lower extremity 01/20/2018  . Fever   . Periumbilical abdominal pain   .  Confusion 12/22/2017  . Acute delirium 12/21/2017  . Protein-calorie malnutrition, severe 12/19/2017  . Intractable nausea and vomiting 12/18/2017  . Lymphedema 12/13/2017  . Cellulitis 11/27/2017  . Chest pain 11/19/2017  . Atherosclerosis of native arteries of the extremities with ulceration ([place]) 11/07/2017  . Twitching 01/03/2017  . Elevated troponin 10/02/2015  . Complications, dialysis, catheter, mechanical ([place]) 10/02/2015  . Musculoskeletal chest pain 09/28/2015  . Chronic diastolic CHF (congestive heart failure) ([place]) 09/28/2015  . End stage renal disease ([employer]) 10/09/2012  . Hypertension 10/09/2012  . Gout 10/09/2012   Janna Arch, PT, DPT   06/13/2019, 1:32 PM  [place] MAIN [facility] SERVICES [address] [place], [place], [address] Phone: [phone]   Fax:  [phone]  Name: Johnny [name]. MRN: [id number] Date of Birth: 1950/07/05

## 2019-06-18 ENCOUNTER — Other Ambulatory Visit: Payer: Self-pay

## 2019-06-18 ENCOUNTER — Ambulatory Visit: Payer: Medicare Other

## 2019-06-18 DIAGNOSIS — R262 Difficulty in walking, not elsewhere classified: Secondary | ICD-10-CM

## 2019-06-18 DIAGNOSIS — R2681 Unsteadiness on feet: Secondary | ICD-10-CM

## 2019-06-18 NOTE — Therapy (Signed)
[place] MAIN [facility] SERVICES [address] [place], [place], [address] Phone: [phone]   Fax:  [phone]  Physical Therapy Treatment  Patient Details  Name: Johnny [name]. MRN: [id number] Date of Birth: 04-Nov-1950 Referring Provider (PT): Eulogio Ditch, NP    Encounter Date: 06/18/2019  PT End of Session - 06/18/19 0939    Visit Number  5    Number of Visits  17    Date for PT Re-Evaluation  07/23/19    Authorization Type  [employer] Medicare    Authorization Time Period  05/28/19-07/23/19    Authorization - Visit Number  5    Authorization - Number of Visits  10    PT Start Time  0846    PT Stop Time  0926    PT Time Calculation (min)  40 min    Equipment Utilized During Treatment  Gait belt    Activity Tolerance  Patient tolerated treatment well;No increased pain;Patient limited by fatigue    Behavior During Therapy  [facility] for tasks assessed/performed       Past Medical History:  Diagnosis Date  . Anemia   . Anxiety   . CHF (congestive heart failure) ([place])   . Chronic kidney disease    esrd dialysis m/w/f  . Gout   . Hyperlipidemia   . Hypertension   . Myocardial infarction ([place]) 2010   10 years ago  . 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  . Peripheral vascular disease [facility])     Past Surgical History:  Procedure Laterality Date  . A/V FISTULAGRAM Right 09/06/2018   Procedure: A/V FISTULAGRAM;  Surgeon: Algernon Huxley, MD;  Location: [place] CV LAB;  Service: Cardiovascular;  Laterality: Right;  . A/V SHUNTOGRAM Left 06/21/2017   Procedure: A/V SHUNTOGRAM;  Surgeon: Katha Cabal, MD;  Location: [place] [facility];  Service: Cardiovascular;  Laterality: Left;  . A/V SHUNTOGRAM N/A 10/24/2018   Procedure: A/V SHUNTOGRAM;  Surgeon: Algernon Huxley, MD;  Location: [place] CV LAB;  Service: Cardiovascular;  Laterality: N/A;  . ABOVE KNEE LEG AMPUTATION Right 2020  . AMPUTATION Right  10/25/2018   Procedure: AMPUTATION ABOVE KNEE;  Surgeon: Algernon Huxley, MD;  Location: [facility] ORS;  Service: General;  Laterality: Right;  . APPLICATION OF WOUND VAC Right 04/11/2018   Procedure: APPLICATION OF WOUND VAC;  Surgeon: Algernon Huxley, MD;  Location: [facility] ORS;  Service: Vascular;  Laterality: Right;  . AV FISTULA PLACEMENT Left 09/18/2015   Procedure: INSERTION OF ARTERIOVENOUS (AV) GORE-TEX GRAFT ARM ( BRACH/AXILLARY GRAFT W/ INSTANT STICK GRAFT );  Surgeon: Katha Cabal, MD;  Location: [facility] ORS;  Service: Vascular;  Laterality: Left;  . AV FISTULA PLACEMENT Right 07/19/2018   Procedure: INSERTION OF ARTERIOVENOUS (AV) GORE-TEX GRAFT ARM ( BRACHIAL AXILLARY);  Surgeon: Algernon Huxley, MD;  Location: [facility] ORS;  Service: Vascular;  Laterality: Right;  . DIALYSIS FISTULA CREATION Right 10/2017   right chest perm cath  . DIALYSIS/PERMA CATHETER REMOVAL N/A 09/13/2018   Procedure: DIALYSIS/PERMA CATHETER REMOVAL;  Surgeon: Algernon Huxley, MD;  Location: [place] CV LAB;  Service: Cardiovascular;  Laterality: N/A;  . ESOPHAGOGASTRODUODENOSCOPY N/A 12/19/2017   Procedure: ESOPHAGOGASTRODUODENOSCOPY (EGD);  Surgeon: Lin Landsman, MD;  Location: [facility] ENDOSCOPY;  Service: Gastroenterology;  Laterality: N/A;  . LOWER EXTREMITY ANGIOGRAPHY Left 11/16/2017   Procedure: LOWER EXTREMITY ANGIOGRAPHY;  Surgeon: Algernon Huxley, MD;  Location: [place] [facility] LAB;  Service: Cardiovascular;  Laterality: Left;  . LOWER EXTREMITY ANGIOGRAPHY Right 01/18/2018   Procedure: LOWER EXTREMITY ANGIOGRAPHY;  Surgeon: Algernon Huxley, MD;  Location: [place] [facility];  Service: Cardiovascular;  Laterality: Right;  . LOWER EXTREMITY ANGIOGRAPHY Left 04/02/2018   Procedure: LOWER EXTREMITY ANGIOGRAPHY;  Surgeon: Algernon Huxley, MD;  Location: [place] [facility];  Service: Cardiovascular;  Laterality: Left;  . LOWER EXTREMITY ANGIOGRAPHY Right 04/09/2018   Procedure: Lower Extremity Angiography with possible  intervention;  Surgeon: Algernon Huxley, MD;  Location: [place] [facility];  Service: Cardiovascular;  Laterality: Right;  . LOWER EXTREMITY ANGIOGRAPHY Right 07/23/2018   Procedure: Lower Extremity Angiography;  Surgeon: Algernon Huxley, MD;  Location: [place] [facility];  Service: Cardiovascular;  Laterality: Right;  . LOWER EXTREMITY ANGIOGRAPHY Right 09/13/2018   Procedure: LOWER EXTREMITY ANGIOGRAPHY;  Surgeon: Algernon Huxley, MD;  Location: [place] [facility];  Service: Cardiovascular;  Laterality: Right;  . LOWER EXTREMITY VENOGRAPHY Right 09/13/2018   Procedure: LOWER EXTREMITY VENOGRAPHY;  Surgeon: Algernon Huxley, MD;  Location: [place] [facility] LAB;  Service: Cardiovascular;  Laterality: Right;  . PERIPHERAL VASCULAR CATHETERIZATION Left 09/01/2015   Procedure: A/V Shuntogram/Fistulagram;  Surgeon: Katha Cabal, MD;  Location: [place] [facility];  Service: Cardiovascular;  Laterality: Left;  . PERIPHERAL VASCULAR CATHETERIZATION N/A 09/30/2015   Procedure: A/V Shuntogram/Fistulagram with perm cathether removal;  Surgeon: Algernon Huxley, MD;  Location: [place] [facility];  Service: Cardiovascular;  Laterality: N/A;  . PERIPHERAL VASCULAR CATHETERIZATION Left 09/30/2015   Procedure: A/V Shunt Intervention;  Surgeon: Algernon Huxley, MD;  Location: [place] [facility];  Service: Cardiovascular;  Laterality: Left;  . PERIPHERAL VASCULAR CATHETERIZATION Left 12/03/2015   Procedure: Thrombectomy;  Surgeon: Algernon Huxley, MD;  Location: [place] [facility];  Service: Cardiovascular;  Laterality: Left;  . PERIPHERAL VASCULAR CATHETERIZATION Left 01/28/2016   Procedure: Thrombectomy;  Surgeon: Algernon Huxley, MD;  Location: [place] [facility];  Service: Cardiovascular;  Laterality: Left;  . PERIPHERAL VASCULAR CATHETERIZATION N/A 01/28/2016   Procedure: A/V Shuntogram/Fistulagram;  Surgeon: Algernon Huxley, MD;  Location: [place] [facility];  Service: Cardiovascular;  Laterality: N/A;  . SKIN SPLIT GRAFT Right  05/24/2018   Procedure: SKIN GRAFT SPLIT THICKNESS ( RIGHT CALF);  Surgeon: Algernon Huxley, MD;  Location: [facility] ORS;  Service: Vascular;  Laterality: Right;  . UPPER EXTREMITY ANGIOGRAPHY  10/24/2018   Procedure: Upper Extremity Angiography;  Surgeon: Algernon Huxley, MD;  Location: [place] [facility];  Service: Cardiovascular;;  . UPPER EXTREMITY ANGIOGRAPHY Right 02/14/2019   Procedure: UPPER EXTREMITY ANGIOGRAPHY;  Surgeon: Algernon Huxley, MD;  Location: [place] [facility];  Service: Cardiovascular;  Laterality: Right;  . WOUND DEBRIDEMENT Right 04/11/2018   Procedure: DEBRIDEMENT WOUND calf muscle and skin;  Surgeon: Algernon Huxley, MD;  Location: [facility] ORS;  Service: Vascular;  Laterality: Right;    There were no vitals filed for this visit.   Min A transfer without slide board from chair to plinth Supine < sit requires Min-Mod A  Sidelying  Hip flexor stretch with PT overpressure RLE, 2x 60 seconds  L sidelying R clam, 2x 10, Mod cueing for prevention of lumbar rotation  L sidelying resisted R hip extension, PT providing resistance at distal prosthesis, supporting RLE, 2x 10  Supine  Bridge, 2x10, PT stabilization to feet and knees for stability, cueing for gluteal activation  Green swiss ball roll out, 2x10, PT  stabilizing RLE on Swiss ball and at prosthetic knee joint  Quad set with towel roll, 10x 5 sec hold; requires double roll of towel  Marching, 8x each LE, PT assist in initiation of RLE movement   Seated Modified windmills, 8x each side  Airex pad under LLE to neutralize pelvis due to lengthened prosthetic side from improper fit: Rainbow ball adduction, 10x RTB adduction, 10x  Patient presented to PT with excellent motivation. This session, he reported fatigue at 7-9/10 with exercises, yet was able to tolerate therapy well. He has improved with muscle activation as noted with clams, quad sets, and marches. Patient continues to be challenged with controlling prosthetic.  Patient would benefit further from skilled PT intervention to improve strength and mobility to gain better control of prosthetic in order to return to full function at home.   Subjective Assessment - 06/18/19 0936    Subjective  Patient reports no pain, but feeling fatigued. No falls or LOB since last session.    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 Pt DC hospital to SNF for rehab. He has since been back at home. Pt uses a manual WC or power chair for daily mobility. Pt takes tranportation services to HD MWF. Pt was recently fitted for his AKA prosthesis in Jan 2021.    Limitations  Standing;Walking;House hold activities    How long can you sit comfortably?  No difficulty    How long can you stand comfortably?  1 minute    How long can you walk comfortably?  unable    Currently in Pain?  No/denies                               PT Education - 06/18/19 0935    Education Details  exercise technique, body mechanics    Person(s) Educated  Patient    Methods  Explanation;Demonstration;Tactile cues;Verbal cues    Comprehension  Verbalized understanding;Returned demonstration;Verbal cues required;Tactile cues required       PT Short Term Goals - 05/28/19 1055      PT SHORT TERM GOAL #1   Title  Pt to demonstrate ability to perfrom STS transfer from chair c RW minGuard assist and lock his RLE knee joint without assist.    Baseline  ModA from elevated surface    Time  4    Period  Weeks    Status  New    Target Date  06/25/19      PT SHORT TERM GOAL #2   Title  Pt to demonstrate 5 degrees hip flexion 10 degrees hip abdct ROM.    Baseline  lacks 5 degrees from neutral in both    Time  4    Period  Weeks    Status  New    Target Date  06/25/19        PT Long Term Goals - 05/28/19 1057      PT LONG TERM GOAL #1   Title  Pt to demonstrates 15 degrees Rt hip ABDCT, 10 degrees Rt hip  extension P/ROM to facilitate prosthesis motor control.    Time  8    Period  Weeks    Status  New    Target Date  07/23/19      PT LONG TERM GOAL #2   Title  Pt to demonstrate 5/5 strength hip extension at 0 degrees flexion/extension  Time  8    Period  Weeks    Status  New    Target Date  07/23/19      PT LONG TERM GOAL #3   Title  Pt able to perfrom 5x STS from chair with RW, arms ad lib in <21sec    Time  8    Period  Weeks    Status  New    Target Date  07/23/19            Plan - 06/18/19 0957    Clinical Impression Statement  Patient presented to PT with excellent motivation. This session, he reported fatigue at 7-9/10 with exercises, yet was able to tolerate therapy well. He has improved with muscle activation as noted with clams, quad sets, and marches. Patient continues to be challenged with controlling prosthetic. Patient would benefit further from skilled PT intervention to improve strength and mobility to gain better control of prosthetic in order to return to full function at home.    Personal Factors and Comorbidities  Age;Fitness;Past/Current Experience;Education;Transportation    Examination-Activity Limitations  Transfers;Dressing;Squat;Stairs;Stand;Bed Mobility    Examination-Participation Restrictions  Meal Prep;Cleaning;Community Activity;Driving;Yard Work;Laundry;Shop    Stability/Clinical Decision Making  Stable/Uncomplicated    Rehab Potential  Good    PT Frequency  2x / week    PT Duration  8 weeks    PT Treatment/Interventions  ADLs/Self Care Home Management;Cryotherapy;Electrical Stimulation;DME Instruction;Gait training;Stair training;Functional mobility training;Therapeutic activities;Therapeutic exercise;Balance training;Neuromuscular re-education;Cognitive remediation;Patient/family education;Prosthetic Training;Wheelchair mobility training;Passive range of motion;Dry needling;Scar mobilization;Spinal Manipulations;Joint Manipulations    PT Next  Visit Plan  Establish home stretching and strengthening HEP    PT Home Exercise Plan  prone positioning to stretch R hip extension, will add additional HEP at next visit    Consulted and Agree with Plan of Care  Patient       Patient will benefit from skilled therapeutic intervention in order to improve the following deficits and impairments:  Decreased activity tolerance, Decreased balance, Decreased mobility, Decreased strength, Decreased cognition, Decreased knowledge of use of DME, Decreased knowledge of precautions, Decreased endurance, Decreased range of motion, Difficulty walking, Decreased safety awareness, Decreased skin integrity, Decreased scar mobility, Hypomobility, Postural dysfunction, Increased edema, Increased muscle spasms, Increased fascial restricitons, Prosthetic Dependency, Impaired tone, Improper body mechanics  Visit Diagnosis: Difficulty in walking, not elsewhere classified  Unsteadiness on feet     Problem List Patient Active Problem List   Diagnosis Date Noted  . Cardiomyopathy ([place]) 01/22/2019  . CHF (congestive heart failure) ([place]) 01/22/2019  . Coronary disease 01/22/2019  . Above knee amputation of right lower extremity ([place]) 11/13/2018  . Leg ulcer, right, with fat layer exposed ([place]) 10/22/2018  . Cellulitis of right leg 07/21/2018  . Lower limb ulcer, calf, right, limited to breakdown of skin ([place]) 06/08/2018  . PVD (peripheral vascular disease) ([place]) 04/17/2018  . Malnutrition of moderate degree 04/11/2018  . Pressure injury of skin 04/06/2018  . Altered mental status 04/04/2018  . Hypothermia 04/04/2018  . Hemodialysis graft malfunction ([place]) 03/26/2018  . Hypercholesterolemia 02/27/2018  . Diabetes ([place]) 02/27/2018  . Weakness of right lower extremity 01/20/2018  . Fever   . Periumbilical abdominal pain   . Confusion 12/22/2017  . Acute delirium 12/21/2017  . Protein-calorie malnutrition, severe 12/19/2017  . Intractable nausea and vomiting  12/18/2017  . Lymphedema 12/13/2017  . Cellulitis 11/27/2017  . Chest pain 11/19/2017  . Atherosclerosis of native arteries of the extremities with ulceration ([place]) 11/07/2017  . Twitching 01/03/2017  .  Elevated troponin 10/02/2015  . Complications, dialysis, catheter, mechanical ([place]) 10/02/2015  . Musculoskeletal chest pain 09/28/2015  . Chronic diastolic CHF (congestive heart failure) ([place]) 09/28/2015  . End stage renal disease ([place]) 10/09/2012  . Hypertension 10/09/2012  . Gout 10/09/2012   Florinda Marker, SPT  This entire session was performed under direct supervision and direction of a licensed therapist/therapist assistant . I have personally read, edited and approve of the note as written.  Janna Arch, PT, DPT   06/18/2019, 9:59 AM  [place] MAIN [facility] SERVICES [address] [place], [place], [address] Phone: [phone]   Fax:  [phone]  Name: Jsutin Bieker. MRN: [id number] Date of Birth: [birth year]

## 2019-06-25 ENCOUNTER — Ambulatory Visit: Payer: Medicare Other

## 2019-06-25 ENCOUNTER — Other Ambulatory Visit: Payer: Self-pay

## 2019-06-25 DIAGNOSIS — R2681 Unsteadiness on feet: Secondary | ICD-10-CM

## 2019-06-25 DIAGNOSIS — R262 Difficulty in walking, not elsewhere classified: Secondary | ICD-10-CM

## 2019-06-25 NOTE — Therapy (Signed)
[place] MAIN [facility] SERVICES [address] [place], [place], [address] Phone: [phone]   Fax:  [phone]  Physical Therapy Treatment  Patient Details  Name: Johnny [name]. MRN: [id number] Date of Birth: [birth year] Referring Provider (PT): Eulogio Ditch, NP    Encounter Date: 06/25/2019  PT End of Session - 06/25/19 0954    Visit Number  6    Number of Visits  17    Date for PT Re-Evaluation  07/23/19    Authorization Type  [employer] Medicare    Authorization Time Period  05/28/19-07/23/19    Authorization - Visit Number  6    Authorization - Number of Visits  10    PT Start Time  0940    PT Stop Time  1018    PT Time Calculation (min)  38 min    Equipment Utilized During Treatment  Gait belt    Activity Tolerance  Patient tolerated treatment well;No increased pain;Patient limited by fatigue    Behavior During Therapy  [facility] for tasks assessed/performed       Past Medical History:  Diagnosis Date  . Anemia   . Anxiety   . CHF (congestive heart failure) ([place])   . Chronic kidney disease    esrd dialysis m/w/f  . Gout   . Hyperlipidemia   . Hypertension   . Myocardial infarction ([place]) 2010   10 years ago  . 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  . Peripheral vascular disease [facility])     Past Surgical History:  Procedure Laterality Date  . A/V FISTULAGRAM Right 09/06/2018   Procedure: A/V FISTULAGRAM;  Surgeon: Algernon Huxley, MD;  Location: [place] CV LAB;  Service: Cardiovascular;  Laterality: Right;  . A/V SHUNTOGRAM Left 06/21/2017   Procedure: A/V SHUNTOGRAM;  Surgeon: Katha Cabal, MD;  Location: [place] CV LAB;  Service: Cardiovascular;  Laterality: Left;  . A/V SHUNTOGRAM N/A 10/24/2018   Procedure: A/V SHUNTOGRAM;  Surgeon: Algernon Huxley, MD;  Location: [place] CV LAB;  Service: Cardiovascular;  Laterality: N/A;  . ABOVE KNEE LEG AMPUTATION Right 2020  . AMPUTATION Right  10/25/2018   Procedure: AMPUTATION ABOVE KNEE;  Surgeon: Algernon Huxley, MD;  Location: [facility] ORS;  Service: General;  Laterality: Right;  . APPLICATION OF WOUND VAC Right 04/11/2018   Procedure: APPLICATION OF WOUND VAC;  Surgeon: Algernon Huxley, MD;  Location: [facility] ORS;  Service: Vascular;  Laterality: Right;  . AV FISTULA PLACEMENT Left 09/18/2015   Procedure: INSERTION OF ARTERIOVENOUS (AV) GORE-TEX GRAFT ARM ( BRACH/AXILLARY GRAFT W/ INSTANT STICK GRAFT );  Surgeon: Katha Cabal, MD;  Location: [facility] ORS;  Service: Vascular;  Laterality: Left;  . AV FISTULA PLACEMENT Right 07/19/2018   Procedure: INSERTION OF ARTERIOVENOUS (AV) GORE-TEX GRAFT ARM ( BRACHIAL AXILLARY);  Surgeon: Algernon Huxley, MD;  Location: [facility] ORS;  Service: Vascular;  Laterality: Right;  . DIALYSIS FISTULA CREATION Right 10/2017   right chest perm cath  . DIALYSIS/PERMA CATHETER REMOVAL N/A 09/13/2018   Procedure: DIALYSIS/PERMA CATHETER REMOVAL;  Surgeon: Algernon Huxley, MD;  Location: [place] CV LAB;  Service: Cardiovascular;  Laterality: N/A;  . ESOPHAGOGASTRODUODENOSCOPY N/A 12/19/2017   Procedure: ESOPHAGOGASTRODUODENOSCOPY (EGD);  Surgeon: Lin Landsman, MD;  Location: [facility] ENDOSCOPY;  Service: Gastroenterology;  Laterality: N/A;  . LOWER EXTREMITY ANGIOGRAPHY Left 11/16/2017   Procedure: LOWER EXTREMITY ANGIOGRAPHY;  Surgeon: Algernon Huxley, MD;  Location: [place] CV LAB;  Service: Cardiovascular;  Laterality: Left;  . LOWER EXTREMITY ANGIOGRAPHY Right 01/18/2018   Procedure: LOWER EXTREMITY ANGIOGRAPHY;  Surgeon: Algernon Huxley, MD;  Location: [place] CV LAB;  Service: Cardiovascular;  Laterality: Right;  . LOWER EXTREMITY ANGIOGRAPHY Left 04/02/2018   Procedure: LOWER EXTREMITY ANGIOGRAPHY;  Surgeon: Algernon Huxley, MD;  Location: [place] CV LAB;  Service: Cardiovascular;  Laterality: Left;  . LOWER EXTREMITY ANGIOGRAPHY Right 04/09/2018   Procedure: Lower Extremity Angiography with possible  intervention;  Surgeon: Algernon Huxley, MD;  Location: [place] CV LAB;  Service: Cardiovascular;  Laterality: Right;  . LOWER EXTREMITY ANGIOGRAPHY Right 07/23/2018   Procedure: Lower Extremity Angiography;  Surgeon: Algernon Huxley, MD;  Location: [place] CV LAB;  Service: Cardiovascular;  Laterality: Right;  . LOWER EXTREMITY ANGIOGRAPHY Right 09/13/2018   Procedure: LOWER EXTREMITY ANGIOGRAPHY;  Surgeon: Algernon Huxley, MD;  Location: [place] CV LAB;  Service: Cardiovascular;  Laterality: Right;  . LOWER EXTREMITY VENOGRAPHY Right 09/13/2018   Procedure: LOWER EXTREMITY VENOGRAPHY;  Surgeon: Algernon Huxley, MD;  Location: [place] CV LAB;  Service: Cardiovascular;  Laterality: Right;  . PERIPHERAL VASCULAR CATHETERIZATION Left 09/01/2015   Procedure: A/V Shuntogram/Fistulagram;  Surgeon: Katha Cabal, MD;  Location: [place] CV LAB;  Service: Cardiovascular;  Laterality: Left;  . PERIPHERAL VASCULAR CATHETERIZATION N/A 09/30/2015   Procedure: A/V Shuntogram/Fistulagram with perm cathether removal;  Surgeon: Algernon Huxley, MD;  Location: [place] CV LAB;  Service: Cardiovascular;  Laterality: N/A;  . PERIPHERAL VASCULAR CATHETERIZATION Left 09/30/2015   Procedure: A/V Shunt Intervention;  Surgeon: Algernon Huxley, MD;  Location: [place] CV LAB;  Service: Cardiovascular;  Laterality: Left;  . PERIPHERAL VASCULAR CATHETERIZATION Left 12/03/2015   Procedure: Thrombectomy;  Surgeon: Algernon Huxley, MD;  Location: [place] CV LAB;  Service: Cardiovascular;  Laterality: Left;  . PERIPHERAL VASCULAR CATHETERIZATION Left 01/28/2016   Procedure: Thrombectomy;  Surgeon: Algernon Huxley, MD;  Location: [place] CV LAB;  Service: Cardiovascular;  Laterality: Left;  . PERIPHERAL VASCULAR CATHETERIZATION N/A 01/28/2016   Procedure: A/V Shuntogram/Fistulagram;  Surgeon: Algernon Huxley, MD;  Location: [place] [facility] LAB;  Service: Cardiovascular;  Laterality: N/A;  . SKIN SPLIT GRAFT Right  05/24/2018   Procedure: SKIN GRAFT SPLIT THICKNESS ( RIGHT CALF);  Surgeon: Algernon Huxley, MD;  Location: [facility] ORS;  Service: Vascular;  Laterality: Right;  . UPPER EXTREMITY ANGIOGRAPHY  10/24/2018   Procedure: Upper Extremity Angiography;  Surgeon: Algernon Huxley, MD;  Location: [place] [facility] LAB;  Service: Cardiovascular;;  . UPPER EXTREMITY ANGIOGRAPHY Right 02/14/2019   Procedure: UPPER EXTREMITY ANGIOGRAPHY;  Surgeon: Algernon Huxley, MD;  Location: [place] CV LAB;  Service: Cardiovascular;  Laterality: Right;  . WOUND DEBRIDEMENT Right 04/11/2018   Procedure: DEBRIDEMENT WOUND calf muscle and skin;  Surgeon: Algernon Huxley, MD;  Location: [facility] ORS;  Service: Vascular;  Laterality: Right;    There were no vitals filed for this visit.  Subjective Assessment - 06/25/19 0953    Subjective  Pt doing well this date, a bit sleepy. Reports no updates since last session. HEP reported to be adequate.    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 Pt DC hospital to SNF for rehab. He has since been back at home. Pt uses a manual WC or power chair for daily mobility. Pt takes tranportation services to HD MWF. Pt was recently fitted for his  AKA prosthesis in Jan 2021.    Currently in Pain?  No/denies       INTERVENTION THIS DATE:  -Left SKTC 3x30sec, towel behind knee for grasp -Supervision level, lateral scoot transfer WC to/from plinth  -RLE hooklying marching to end-range 2x15 -RLE gluteal set (hel on 2 pillows) 2x15x3secH -hooklying bridge 2x10  -Right hip flexor stretch right leg hanging over edge of table, heel supported on stool 1x76minutes - STS c RW/presthesis, sustained standing 5x30sec, rests as needed    PT Short Term Goals - 05/28/19 1055      PT SHORT TERM GOAL #1   Title  Pt to demonstrate ability to perfrom STS transfer from chair c RW minGuard assist and lock his RLE knee joint without assist.    Baseline  ModA  from elevated surface    Time  4    Period  Weeks    Status  New    Target Date  06/25/19      PT SHORT TERM GOAL #2   Title  Pt to demonstrate 5 degrees hip flexion 10 degrees hip abdct ROM.    Baseline  lacks 5 degrees from neutral in both    Time  4    Period  Weeks    Status  New    Target Date  06/25/19        PT Long Term Goals - 05/28/19 1057      PT LONG TERM GOAL #1   Title  Pt to demonstrates 15 degrees Rt hip ABDCT, 10 degrees Rt hip extension P/ROM to facilitate prosthesis motor control.    Time  8    Period  Weeks    Status  New    Target Date  07/23/19      PT LONG TERM GOAL #2   Title  Pt to demonstrate 5/5 strength hip extension at 0 degrees flexion/extension    Time  8    Period  Weeks    Status  New    Target Date  07/23/19      PT LONG TERM GOAL #3   Title  Pt able to perfrom 5x STS from chair with RW, arms ad lib in <21sec    Time  8    Period  Weeks    Status  New    Target Date  07/23/19            Plan - 06/25/19 0958    Clinical Impression Statement  Conitnued to progress basic mobility and motor control of RLE and funcitonal mobility with prostehsis donned. Pt sleepy most of session which limits some of his attention capacity. Included some STS transfers with RW to promote tolerance to socket in load bearing capacity.    Rehab Potential  Good    PT Frequency  2x / week    PT Duration  8 weeks    PT Treatment/Interventions  ADLs/Self Care Home Management;Cryotherapy;Electrical Stimulation;DME Instruction;Gait training;Stair training;Functional mobility training;Therapeutic activities;Therapeutic exercise;Balance training;Neuromuscular re-education;Cognitive remediation;Patient/family education;Prosthetic Training;Wheelchair mobility training;Passive range of motion;Dry needling;Scar mobilization;Spinal Manipulations;Joint Manipulations    PT Next Visit Plan  Establish home stretching and strengthening HEP    PT Home Exercise Plan  prone  positioning to stretch R hip extension, will add additional HEP at next visit    Consulted and Agree with Plan of Care  Patient       Patient will benefit from skilled therapeutic intervention in order to improve the following deficits and impairments:  Decreased activity tolerance,  Decreased balance, Decreased mobility, Decreased strength, Decreased cognition, Decreased knowledge of use of DME, Decreased knowledge of precautions, Decreased endurance, Decreased range of motion, Difficulty walking, Decreased safety awareness, Decreased skin integrity, Decreased scar mobility, Hypomobility, Postural dysfunction, Increased edema, Increased muscle spasms, Increased fascial restricitons, Prosthetic Dependency, Impaired tone, Improper body mechanics  Visit Diagnosis: Difficulty in walking, not elsewhere classified  Unsteadiness on feet     Problem List Patient Active Problem List   Diagnosis Date Noted  . Cardiomyopathy ([place]) 01/22/2019  . CHF (congestive heart failure) ([place]) 01/22/2019  . Coronary disease 01/22/2019  . Above knee amputation of right lower extremity ([place]) 11/13/2018  . Leg ulcer, right, with fat layer exposed ([place]) 10/22/2018  . Cellulitis of right leg 07/21/2018  . Lower limb ulcer, calf, right, limited to breakdown of skin ([place]) 06/08/2018  . PVD (peripheral vascular disease) ([place]) 04/17/2018  . Malnutrition of moderate degree 04/11/2018  . Pressure injury of skin 04/06/2018  . Altered mental status 04/04/2018  . Hypothermia 04/04/2018  . Hemodialysis graft malfunction ([place]) 03/26/2018  . Hypercholesterolemia 02/27/2018  . Diabetes ([place]) 02/27/2018  . Weakness of right lower extremity 01/20/2018  . Fever   . Periumbilical abdominal pain   . Confusion 12/22/2017  . Acute delirium 12/21/2017  . Protein-calorie malnutrition, severe 12/19/2017  . Intractable nausea and vomiting 12/18/2017  . Lymphedema 12/13/2017  . Cellulitis 11/27/2017  . Chest pain 11/19/2017  .  Atherosclerosis of native arteries of the extremities with ulceration ([place]) 11/07/2017  . Twitching 01/03/2017  . Elevated troponin 10/02/2015  . Complications, dialysis, catheter, mechanical ([place]) 10/02/2015  . Musculoskeletal chest pain 09/28/2015  . Chronic diastolic CHF (congestive heart failure) ([place]) 09/28/2015  . End stage renal disease ([place]) 10/09/2012  . Hypertension 10/09/2012  . Gout 10/09/2012   10:09 AM, 06/25/19 Etta Grandchild, PT, DPT Physical Therapist - [facility]  Outpatient Physical Therapy- [place] [phone]     Etta Grandchild 06/25/2019, 10:04 AM  [place] MAIN [facility] SERVICES [address] [place], [place], [address] Phone: [phone]   Fax:  [phone]  Name: Johnny Wint. MRN: [id number] Date of Birth: 20-May-1950

## 2019-06-27 ENCOUNTER — Ambulatory Visit: Payer: Medicare Other

## 2019-07-02 ENCOUNTER — Ambulatory Visit: Payer: Medicare Other

## 2019-07-02 ENCOUNTER — Other Ambulatory Visit: Payer: Self-pay

## 2019-07-02 DIAGNOSIS — R262 Difficulty in walking, not elsewhere classified: Secondary | ICD-10-CM | POA: Diagnosis not present

## 2019-07-02 DIAGNOSIS — R2681 Unsteadiness on feet: Secondary | ICD-10-CM

## 2019-07-02 NOTE — Patient Instructions (Signed)
Access Code: [email or web address] URL: [email or web address] Date: 07/02/2019 Prepared by: Roxana Hires  Exercises Supine Single Leg Bridge with Sound Leg (AKA) - 10 reps - 2 sets - 5s hold - 2x daily - 7x weekly Prone Hip Flexor Stretch with Towel Roll (AKA) - 5-10 minutes hold - 3x daily - 7x weekly Sidelying Hip Abduction (AKA) - 10 reps - 2 sets - 5s hold - 2x daily - 7x weekly Sidelying Isometric Hip Adduction (AKA) - 10 reps - 2 sets - 5s hold - 2x daily - 7x weekly

## 2019-07-02 NOTE — Therapy (Signed)
[place] MAIN [facility] SERVICES [address] [place], [place], [address] Phone: [phone]   Fax:  [phone]  Physical Therapy Treatment  Patient Details  Name: Marcques Weghorst. MRN: [id number] Date of Birth: 27-Jul-1950 Referring Provider (PT): Eulogio Ditch, NP    Encounter Date: 07/02/2019  PT End of Session - 07/02/19 0955    Visit Number  7    Number of Visits  17    Date for PT Re-Evaluation  07/23/19    Authorization Type  [employer] Medicare    Authorization Time Period  05/28/19-07/23/19    Authorization - Visit Number  7    Authorization - Number of Visits  10    PT Start Time  [id number]    PT Stop Time  1015    PT Time Calculation (min)  40 min    Equipment Utilized During Treatment  Gait belt    Activity Tolerance  Patient tolerated treatment well;No increased pain;Patient limited by fatigue    Behavior During Therapy  [name] for tasks assessed/performed       Past Medical History:  Diagnosis Date  . Anemia   . Anxiety   . CHF (congestive heart failure) ([place])   . Chronic kidney disease    esrd dialysis m/w/f  . Gout   . Hyperlipidemia   . Hypertension   . Myocardial infarction ([place]) 2010   10 years ago  . 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  . Peripheral vascular disease [facility])     Past Surgical History:  Procedure Laterality Date  . A/V FISTULAGRAM Right 09/06/2018   Procedure: A/V FISTULAGRAM;  Surgeon: Algernon Huxley, MD;  Location: [place] CV LAB;  Service: Cardiovascular;  Laterality: Right;  . A/V SHUNTOGRAM Left 06/21/2017   Procedure: A/V SHUNTOGRAM;  Surgeon: Katha Cabal, MD;  Location: [place] CV LAB;  Service: Cardiovascular;  Laterality: Left;  . A/V SHUNTOGRAM N/A 10/24/2018   Procedure: A/V SHUNTOGRAM;  Surgeon: Algernon Huxley, MD;  Location: [place] CV LAB;  Service: Cardiovascular;  Laterality: N/A;  . ABOVE KNEE LEG AMPUTATION Right 2020  . AMPUTATION Right  10/25/2018   Procedure: AMPUTATION ABOVE KNEE;  Surgeon: Algernon Huxley, MD;  Location: [facility] ORS;  Service: General;  Laterality: Right;  . APPLICATION OF WOUND VAC Right 04/11/2018   Procedure: APPLICATION OF WOUND VAC;  Surgeon: Algernon Huxley, MD;  Location: [facility] ORS;  Service: Vascular;  Laterality: Right;  . AV FISTULA PLACEMENT Left 09/18/2015   Procedure: INSERTION OF ARTERIOVENOUS (AV) GORE-TEX GRAFT ARM ( BRACH/AXILLARY GRAFT W/ INSTANT STICK GRAFT );  Surgeon: Katha Cabal, MD;  Location: [facility] ORS;  Service: Vascular;  Laterality: Left;  . AV FISTULA PLACEMENT Right 07/19/2018   Procedure: INSERTION OF ARTERIOVENOUS (AV) GORE-TEX GRAFT ARM ( BRACHIAL AXILLARY);  Surgeon: Algernon Huxley, MD;  Location: [facility] ORS;  Service: Vascular;  Laterality: Right;  . DIALYSIS FISTULA CREATION Right 10/2017   right chest perm cath  . DIALYSIS/PERMA CATHETER REMOVAL N/A 09/13/2018   Procedure: DIALYSIS/PERMA CATHETER REMOVAL;  Surgeon: Algernon Huxley, MD;  Location: [place] CV LAB;  Service: Cardiovascular;  Laterality: N/A;  . ESOPHAGOGASTRODUODENOSCOPY N/A 12/19/2017   Procedure: ESOPHAGOGASTRODUODENOSCOPY (EGD);  Surgeon: Lin Landsman, MD;  Location: [facility] ENDOSCOPY;  Service: Gastroenterology;  Laterality: N/A;  . LOWER EXTREMITY ANGIOGRAPHY Left 11/16/2017   Procedure: LOWER EXTREMITY ANGIOGRAPHY;  Surgeon: Algernon Huxley, MD;  Location: [place] CV LAB;  Service: Cardiovascular;  Laterality: Left;  . LOWER EXTREMITY ANGIOGRAPHY Right 01/18/2018   Procedure: LOWER EXTREMITY ANGIOGRAPHY;  Surgeon: Algernon Huxley, MD;  Location: [place] CV LAB;  Service: Cardiovascular;  Laterality: Right;  . LOWER EXTREMITY ANGIOGRAPHY Left 04/02/2018   Procedure: LOWER EXTREMITY ANGIOGRAPHY;  Surgeon: Algernon Huxley, MD;  Location: [place] CV LAB;  Service: Cardiovascular;  Laterality: Left;  . LOWER EXTREMITY ANGIOGRAPHY Right 04/09/2018   Procedure: Lower Extremity Angiography with possible  intervention;  Surgeon: Algernon Huxley, MD;  Location: [place] CV LAB;  Service: Cardiovascular;  Laterality: Right;  . LOWER EXTREMITY ANGIOGRAPHY Right 07/23/2018   Procedure: Lower Extremity Angiography;  Surgeon: Algernon Huxley, MD;  Location: [place] CV LAB;  Service: Cardiovascular;  Laterality: Right;  . LOWER EXTREMITY ANGIOGRAPHY Right 09/13/2018   Procedure: LOWER EXTREMITY ANGIOGRAPHY;  Surgeon: Algernon Huxley, MD;  Location: [place] CV LAB;  Service: Cardiovascular;  Laterality: Right;  . LOWER EXTREMITY VENOGRAPHY Right 09/13/2018   Procedure: LOWER EXTREMITY VENOGRAPHY;  Surgeon: Algernon Huxley, MD;  Location: [place] CV LAB;  Service: Cardiovascular;  Laterality: Right;  . PERIPHERAL VASCULAR CATHETERIZATION Left 09/01/2015   Procedure: A/V Shuntogram/Fistulagram;  Surgeon: Katha Cabal, MD;  Location: [place] CV LAB;  Service: Cardiovascular;  Laterality: Left;  . PERIPHERAL VASCULAR CATHETERIZATION N/A 09/30/2015   Procedure: A/V Shuntogram/Fistulagram with perm cathether removal;  Surgeon: Algernon Huxley, MD;  Location: [place] [facility] LAB;  Service: Cardiovascular;  Laterality: N/A;  . PERIPHERAL VASCULAR CATHETERIZATION Left 09/30/2015   Procedure: A/V Shunt Intervention;  Surgeon: Algernon Huxley, MD;  Location: [place] CV LAB;  Service: Cardiovascular;  Laterality: Left;  . PERIPHERAL VASCULAR CATHETERIZATION Left 12/03/2015   Procedure: Thrombectomy;  Surgeon: Algernon Huxley, MD;  Location: [place] CV LAB;  Service: Cardiovascular;  Laterality: Left;  . PERIPHERAL VASCULAR CATHETERIZATION Left 01/28/2016   Procedure: Thrombectomy;  Surgeon: Algernon Huxley, MD;  Location: [place] CV LAB;  Service: Cardiovascular;  Laterality: Left;  . PERIPHERAL VASCULAR CATHETERIZATION N/A 01/28/2016   Procedure: A/V Shuntogram/Fistulagram;  Surgeon: Algernon Huxley, MD;  Location: [place] CV LAB;  Service: Cardiovascular;  Laterality: N/A;  . SKIN SPLIT GRAFT Right  05/24/2018   Procedure: SKIN GRAFT SPLIT THICKNESS ( RIGHT CALF);  Surgeon: Algernon Huxley, MD;  Location: [facility] ORS;  Service: Vascular;  Laterality: Right;  . UPPER EXTREMITY ANGIOGRAPHY  10/24/2018   Procedure: Upper Extremity Angiography;  Surgeon: Algernon Huxley, MD;  Location: [place] CV LAB;  Service: Cardiovascular;;  . UPPER EXTREMITY ANGIOGRAPHY Right 02/14/2019   Procedure: UPPER EXTREMITY ANGIOGRAPHY;  Surgeon: Algernon Huxley, MD;  Location: [place] CV LAB;  Service: Cardiovascular;  Laterality: Right;  . WOUND DEBRIDEMENT Right 04/11/2018   Procedure: DEBRIDEMENT WOUND calf muscle and skin;  Surgeon: Algernon Huxley, MD;  Location: [facility] ORS;  Service: Vascular;  Laterality: Right;    There were no vitals filed for this visit.  Subjective Assessment - 07/02/19 0949    Subjective  Pt doing well this date. Reports no updates since last session. No pain upon arrival. Pt reports that he doesn't have an HEP but he has been trying to lay on his stomach to stretch his hip.    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 Pt DC hospital to SNF for rehab. He has since been back at home. Pt uses a manual WC or  power chair for daily mobility. Pt takes tranportation services to HD MWF. Pt was recently fitted for his AKA prosthesis in Jan 2021.    Limitations  Standing;Walking;House hold activities    How long can you sit comfortably?  No difficulty    How long can you stand comfortably?  1 minute    Currently in Pain?  No/denies        TREATMENT   Ther-ex  Seated marching 2 x 15 bilateral; Seated clams with manual resistance from therapist 2 x 15 bilateral; Seated adductor squeeze with manual resistance from therapist 2 x 15 bilateral; Standing hip extension/glut sets 2 x 10 with extensive verbal and tactile cues from therapist; Supine R SLR with therapist supporting end of prosethesis x 10; Supine hip abduction with manual  resistance from therapist x 10; Supine hip adduction with manual resistance from therapist x 10; Bridges with bolster under knees and RLE prosthesis donned 3s hold x 10; Education regarding HEP;    Ther-Activity Supervision level, lateral scoot transfer WC to/from plinth; Assist required to help get residual RLE farther into socket. Pt able to get within approximately 2 inches of the bottom of his socket in standing with therapist providing assist. This is an improvement from the last time pt was seen by this therapist.  Sit to stand with rolling walker from elevated mat table x 2, extensive cues from therapist for proper/safe hand placement and sequencing. Once upright pt requires cues/asisst to lock R prosthesis knee joint and to move foot back under trunk for improved hip extension. Pt requiring intermittent cues for full upright posture once in standing; Standing standing endurance with BUE support on rolling walker; Performed lateral weight shifting in standing with patient using rolling walker and CGA. Cues provided for weight transfer and to increase WB through RLE. Performed 2 x 30s;   Pt educated throughout session about proper posture and technique with exercises. Improved exercise technique, movement at target joints, use of target muscles after min to mod verbal, visual, tactile cues.     Pt demonstrates excellent motivation during session today. He continues to have some difficulty getting his residual RLE fully seated into the socket but with assist in standing pt able to get within approximately 2 inches of the bottom of his socket in standing. This is an improvement from the last time pt was seen by this therapist. He is able to complete seated and supine exercises today as well as practice sit to stand transfers and lateral weight shifting. Pt was furnished with a written HEP. Pt will benefit from PT services to address deficits in strength, balance, and mobility in order to return  to full function at home.                       PT Short Term Goals - 05/28/19 1055      PT SHORT TERM GOAL #1   Title  Pt to demonstrate ability to perfrom STS transfer from chair c RW minGuard assist and lock his RLE knee joint without assist.    Baseline  ModA from elevated surface    Time  4    Period  Weeks    Status  New    Target Date  06/25/19      PT SHORT TERM GOAL #2   Title  Pt to demonstrate 5 degrees hip flexion 10 degrees hip abdct ROM.    Baseline  lacks 5 degrees from neutral in both  Time  4    Period  Weeks    Status  New    Target Date  06/25/19        PT Long Term Goals - 05/28/19 1057      PT LONG TERM GOAL #1   Title  Pt to demonstrates 15 degrees Rt hip ABDCT, 10 degrees Rt hip extension P/ROM to facilitate prosthesis motor control.    Time  8    Period  Weeks    Status  New    Target Date  07/23/19      PT LONG TERM GOAL #2   Title  Pt to demonstrate 5/5 strength hip extension at 0 degrees flexion/extension    Time  8    Period  Weeks    Status  New    Target Date  07/23/19      PT LONG TERM GOAL #3   Title  Pt able to perfrom 5x STS from chair with RW, arms ad lib in <21sec    Time  8    Period  Weeks    Status  New    Target Date  07/23/19            Plan - 07/02/19 0955    Clinical Impression Statement  Pt demonstrates excellent motivation during session today. He continues to have some difficulty getting his residual RLE fully seated into the socket but with assist in standing pt able to get within approximately 2 inches of the bottom of his socket in standing. This is an improvement from the last time pt was seen by this therapist. He is able to complete seated and supine exercises today as well as practice sit to stand transfers and lateral weight shifting. Pt was furnished with a written HEP. Pt will benefit from PT services to address deficits in strength, balance, and mobility in order to return to full  function at home.    Personal Factors and Comorbidities  Age;Fitness;Past/Current Experience;Education;Transportation    Examination-Activity Limitations  Transfers;Dressing;Squat;Stairs;Stand;Bed Mobility    Examination-Participation Restrictions  Meal Prep;Cleaning;Community Activity;Driving;Yard Work;Laundry;Shop    Stability/Clinical Decision Making  Stable/Uncomplicated    Clinical Decision Making  Low    Rehab Potential  Good    PT Frequency  2x / week    PT Duration  8 weeks    PT Treatment/Interventions  ADLs/Self Care Home Management;Cryotherapy;Electrical Stimulation;DME Instruction;Gait training;Stair training;Functional mobility training;Therapeutic activities;Therapeutic exercise;Balance training;Neuromuscular re-education;Cognitive remediation;Patient/family education;Prosthetic Training;Wheelchair mobility training;Passive range of motion;Dry needling;Scar mobilization;Spinal Manipulations;Joint Manipulations    PT Next Visit Plan  Medbridge Access Code: [email or web address] (prone positioning), L single leg bridges, L sidelying R hip abduction, hip isometric adduction    PT Home Exercise Plan  prone positioning to stretch R hip extension, will add additional HEP at next visit    Consulted and Agree with Plan of Care  Patient       Patient will benefit from skilled therapeutic intervention in order to improve the following deficits and impairments:  Decreased activity tolerance, Decreased balance, Decreased mobility, Decreased strength, Decreased cognition, Decreased knowledge of use of DME, Decreased knowledge of precautions, Decreased endurance, Decreased range of motion, Difficulty walking, Decreased safety awareness, Decreased skin integrity, Decreased scar mobility, Hypomobility, Postural dysfunction, Increased edema, Increased muscle spasms, Increased fascial restricitons, Prosthetic Dependency, Impaired tone, Improper body mechanics  Visit Diagnosis: Difficulty in walking, not elsewhere  classified  Unsteadiness on feet     Problem List Patient Active Problem List   Diagnosis Date Noted  .  Cardiomyopathy ([place]) 01/22/2019  . CHF (congestive heart failure) ([place]) 01/22/2019  . Coronary disease 01/22/2019  . Above knee amputation of right lower extremity ([place]) 11/13/2018  . Leg ulcer, right, with fat layer exposed ([place]) 10/22/2018  . Cellulitis of right leg 07/21/2018  . Lower limb ulcer, calf, right, limited to breakdown of skin ([place]) 06/08/2018  . PVD (peripheral vascular disease) ([place]) 04/17/2018  . Malnutrition of moderate degree 04/11/2018  . Pressure injury of skin 04/06/2018  . Altered mental status 04/04/2018  . Hypothermia 04/04/2018  . Hemodialysis graft malfunction ([place]) 03/26/2018  . Hypercholesterolemia 02/27/2018  . Diabetes ([place]) 02/27/2018  . Weakness of right lower extremity 01/20/2018  . Fever   . Periumbilical abdominal pain   . Confusion 12/22/2017  . Acute delirium 12/21/2017  . Protein-calorie malnutrition, severe 12/19/2017  . Intractable nausea and vomiting 12/18/2017  . Lymphedema 12/13/2017  . Cellulitis 11/27/2017  . Chest pain 11/19/2017  . Atherosclerosis of native arteries of the extremities with ulceration ([place]) 11/07/2017  . Twitching 01/03/2017  . Elevated troponin 10/02/2015  . Complications, dialysis, catheter, mechanical ([place]) 10/02/2015  . Musculoskeletal chest pain 09/28/2015  . Chronic diastolic CHF (congestive heart failure) ([place]) 09/28/2015  . End stage renal disease ([place]) 10/09/2012  . Hypertension 10/09/2012  . Gout 10/09/2012   Phillips Grout PT, DPT, GCS  Syre Knerr 07/02/2019, 11:17 AM  [place] MAIN [facility] SERVICES [address] [place], [place], [address] Phone: [phone]   Fax:  [phone]  Name: Trayson Zenk. MRN: [id number] Date of Birth: [birth year]

## 2019-07-04 ENCOUNTER — Other Ambulatory Visit: Payer: Self-pay

## 2019-07-04 ENCOUNTER — Ambulatory Visit: Payer: Medicare Other

## 2019-07-04 DIAGNOSIS — R2681 Unsteadiness on feet: Secondary | ICD-10-CM

## 2019-07-04 DIAGNOSIS — R262 Difficulty in walking, not elsewhere classified: Secondary | ICD-10-CM

## 2019-07-04 NOTE — Therapy (Signed)
[place] MAIN [facility] SERVICES [address] [place], [place], [address] Phone: [phone]   Fax:  [phone]  Physical Therapy Treatment  Patient Details  Name: Johnny [name]. MRN: [id number] Date of Birth: 23-Aug-1950 Referring Provider (PT): Eulogio Ditch, NP    Encounter Date: 07/04/2019  PT End of Session - 07/04/19 0928    Visit Number  8    Number of Visits  17    Date for PT Re-Evaluation  07/23/19    Authorization Type  [employer] Medicare    Authorization Time Period  05/28/19-07/23/19    Authorization - Visit Number  8    Authorization - Number of Visits  10    PT Start Time  0930    PT Stop Time  1015    PT Time Calculation (min)  45 min    Equipment Utilized During Treatment  Gait belt    Activity Tolerance  Patient tolerated treatment well;No increased pain;Patient limited by fatigue    Behavior During Therapy  [facility] for tasks assessed/performed       Past Medical History:  Diagnosis Date  . Anemia   . Anxiety   . CHF (congestive heart failure) ([place])   . Chronic kidney disease    esrd dialysis m/w/f  . Gout   . Hyperlipidemia   . Hypertension   . Myocardial infarction ([place]) 2010   10 years ago  . 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  . Peripheral vascular disease [facility])     Past Surgical History:  Procedure Laterality Date  . A/V FISTULAGRAM Right 09/06/2018   Procedure: A/V FISTULAGRAM;  Surgeon: Algernon Huxley, MD;  Location: [place] CV LAB;  Service: Cardiovascular;  Laterality: Right;  . A/V SHUNTOGRAM Left 06/21/2017   Procedure: A/V SHUNTOGRAM;  Surgeon: Katha Cabal, MD;  Location: [place] [facility];  Service: Cardiovascular;  Laterality: Left;  . A/V SHUNTOGRAM N/A 10/24/2018   Procedure: A/V SHUNTOGRAM;  Surgeon: Algernon Huxley, MD;  Location: [place] CV LAB;  Service: Cardiovascular;  Laterality: N/A;  . ABOVE KNEE LEG AMPUTATION Right 2020  . AMPUTATION Right  10/25/2018   Procedure: AMPUTATION ABOVE KNEE;  Surgeon: Algernon Huxley, MD;  Location: [facility] ORS;  Service: General;  Laterality: Right;  . APPLICATION OF WOUND VAC Right 04/11/2018   Procedure: APPLICATION OF WOUND VAC;  Surgeon: Algernon Huxley, MD;  Location: [facility] ORS;  Service: Vascular;  Laterality: Right;  . AV FISTULA PLACEMENT Left 09/18/2015   Procedure: INSERTION OF ARTERIOVENOUS (AV) GORE-TEX GRAFT ARM ( BRACH/AXILLARY GRAFT W/ INSTANT STICK GRAFT );  Surgeon: Katha Cabal, MD;  Location: [facility] ORS;  Service: Vascular;  Laterality: Left;  . AV FISTULA PLACEMENT Right 07/19/2018   Procedure: INSERTION OF ARTERIOVENOUS (AV) GORE-TEX GRAFT ARM ( BRACHIAL AXILLARY);  Surgeon: Algernon Huxley, MD;  Location: [facility] ORS;  Service: Vascular;  Laterality: Right;  . DIALYSIS FISTULA CREATION Right 10/2017   right chest perm cath  . DIALYSIS/PERMA CATHETER REMOVAL N/A 09/13/2018   Procedure: DIALYSIS/PERMA CATHETER REMOVAL;  Surgeon: Algernon Huxley, MD;  Location: [place] [facility];  Service: Cardiovascular;  Laterality: N/A;  . ESOPHAGOGASTRODUODENOSCOPY N/A 12/19/2017   Procedure: ESOPHAGOGASTRODUODENOSCOPY (EGD);  Surgeon: Lin Landsman, MD;  Location: [facility] ENDOSCOPY;  Service: Gastroenterology;  Laterality: N/A;  . LOWER EXTREMITY ANGIOGRAPHY Left 11/16/2017   Procedure: LOWER EXTREMITY ANGIOGRAPHY;  Surgeon: Algernon Huxley, MD;  Location: [place] CV LAB;  Service: Cardiovascular;  Laterality: Left;  . LOWER EXTREMITY ANGIOGRAPHY Right 01/18/2018   Procedure: LOWER EXTREMITY ANGIOGRAPHY;  Surgeon: Algernon Huxley, MD;  Location: [place] CV LAB;  Service: Cardiovascular;  Laterality: Right;  . LOWER EXTREMITY ANGIOGRAPHY Left 04/02/2018   Procedure: LOWER EXTREMITY ANGIOGRAPHY;  Surgeon: Algernon Huxley, MD;  Location: [place] CV LAB;  Service: Cardiovascular;  Laterality: Left;  . LOWER EXTREMITY ANGIOGRAPHY Right 04/09/2018   Procedure: Lower Extremity Angiography with possible  intervention;  Surgeon: Algernon Huxley, MD;  Location: [place] [facility];  Service: Cardiovascular;  Laterality: Right;  . LOWER EXTREMITY ANGIOGRAPHY Right 07/23/2018   Procedure: Lower Extremity Angiography;  Surgeon: Algernon Huxley, MD;  Location: [place] CV LAB;  Service: Cardiovascular;  Laterality: Right;  . LOWER EXTREMITY ANGIOGRAPHY Right 09/13/2018   Procedure: LOWER EXTREMITY ANGIOGRAPHY;  Surgeon: Algernon Huxley, MD;  Location: [place] CV LAB;  Service: Cardiovascular;  Laterality: Right;  . LOWER EXTREMITY VENOGRAPHY Right 09/13/2018   Procedure: LOWER EXTREMITY VENOGRAPHY;  Surgeon: Algernon Huxley, MD;  Location: [place] CV LAB;  Service: Cardiovascular;  Laterality: Right;  . PERIPHERAL VASCULAR CATHETERIZATION Left 09/01/2015   Procedure: A/V Shuntogram/Fistulagram;  Surgeon: Katha Cabal, MD;  Location: [place] CV LAB;  Service: Cardiovascular;  Laterality: Left;  . PERIPHERAL VASCULAR CATHETERIZATION N/A 09/30/2015   Procedure: A/V Shuntogram/Fistulagram with perm cathether removal;  Surgeon: Algernon Huxley, MD;  Location: [place] CV LAB;  Service: Cardiovascular;  Laterality: N/A;  . PERIPHERAL VASCULAR CATHETERIZATION Left 09/30/2015   Procedure: A/V Shunt Intervention;  Surgeon: Algernon Huxley, MD;  Location: [place] CV LAB;  Service: Cardiovascular;  Laterality: Left;  . PERIPHERAL VASCULAR CATHETERIZATION Left 12/03/2015   Procedure: Thrombectomy;  Surgeon: Algernon Huxley, MD;  Location: [place] CV LAB;  Service: Cardiovascular;  Laterality: Left;  . PERIPHERAL VASCULAR CATHETERIZATION Left 01/28/2016   Procedure: Thrombectomy;  Surgeon: Algernon Huxley, MD;  Location: [place] CV LAB;  Service: Cardiovascular;  Laterality: Left;  . PERIPHERAL VASCULAR CATHETERIZATION N/A 01/28/2016   Procedure: A/V Shuntogram/Fistulagram;  Surgeon: Algernon Huxley, MD;  Location: [place] CV LAB;  Service: Cardiovascular;  Laterality: N/A;  . SKIN SPLIT GRAFT Right  05/24/2018   Procedure: SKIN GRAFT SPLIT THICKNESS ( RIGHT CALF);  Surgeon: Algernon Huxley, MD;  Location: [facility] ORS;  Service: Vascular;  Laterality: Right;  . UPPER EXTREMITY ANGIOGRAPHY  10/24/2018   Procedure: Upper Extremity Angiography;  Surgeon: Algernon Huxley, MD;  Location: [place] CV LAB;  Service: Cardiovascular;;  . UPPER EXTREMITY ANGIOGRAPHY Right 02/14/2019   Procedure: UPPER EXTREMITY ANGIOGRAPHY;  Surgeon: Algernon Huxley, MD;  Location: [place] CV LAB;  Service: Cardiovascular;  Laterality: Right;  . WOUND DEBRIDEMENT Right 04/11/2018   Procedure: DEBRIDEMENT WOUND calf muscle and skin;  Surgeon: Algernon Huxley, MD;  Location: [facility] ORS;  Service: Vascular;  Laterality: Right;    There were no vitals filed for this visit.  Subjective Assessment - 07/04/19 0927    Subjective  Pt doing well this date. Reports no updates since last session. No pain upon arrival.    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 Pt DC hospital to SNF for rehab. He has since been back at home. Pt uses a manual WC or power chair for daily mobility. Pt takes tranportation services to HD MWF. Pt was recently fitted for his AKA prosthesis in Jan  2021.    Limitations  Standing;Walking;House hold activities    How long can you sit comfortably?  No difficulty    How long can you stand comfortably?  1 minute    Currently in Pain?  No/denies           TREATMENT   Ther-ex  Seated marching 2 x 15 bilateral; Seated clams with red tband 3s hold x 15 bilateral; Seated adductor isometric ball squeeze 3s hold  x 15 bilateral; Supine R SLR with therapist supporting end of prosethesis x 15; Supine hip abduction with manual resistance from therapist x 15; Supine hip adduction with manual resistance from therapist x 15; Bridges with bolster under knees and RLE prosthesis donned 3s hold x 10; Education regarding HEP;     Ther-Activity Supervision level, lateral scoot transfer WC to/from plinth; Assist required to help get residual RLE farther into socket. Pt able to get within approximately 2 inches of the bottom of his socket in standing with therapist providing assist. Sit to stand with rolling walker from elevated mat table x 10, extensive cues from therapist for proper/safe hand placement and sequencing. Repetitive practice with patient locking and unlocking R knee during transfers. Once upright pt requires cues/asisst to lock R prosthetic knee joint and to move foot back under trunk for improved hip extension. Pt requiring intermittent cues for full upright posture once in standing; Standing standing endurance with BUE support on rolling walker; Standing marches next to mat table in rolling walker with cues required for unlocking RLE when lifting but improves with repetition x 10 bilateral; Lateral stepping next to mat table with rolling walker. Pt is able to perform 3' to the R and 3' to the left. He requires max cues for locking/unlocking R prosthesis to allow for weight acceptance to RLE and stepping respectively;   Pt educated throughout session about proper posture and technique with exercises. Improved exercise technique, movement at target joints, use of target muscles after min to mod verbal, visual, tactile cues.     Pt demonstrates excellent motivation during session today. He continues to have some difficulty getting his residual RLE fully seated into the socket but with assist in standing pt able to get within approximately 2 inches of the bottom of his socket in standing. He is able to perform repeated sit to stands with therapist today and improves his ability to lock/unlock his R prosthetic knee with cues. He is able to perform some limited side stepping with therapist today and will likely but appropriate to attempt a few small forward/backward steps in the parallel bars in the near  future. Pt will benefit from PT services to address deficits in strength, balance, and mobility in order to return to full function at home.                        PT Short Term Goals - 05/28/19 1055      PT SHORT TERM GOAL #1   Title  Pt to demonstrate ability to perfrom STS transfer from chair c RW minGuard assist and lock his RLE knee joint without assist.    Baseline  ModA from elevated surface    Time  4    Period  Weeks    Status  New    Target Date  06/25/19      PT SHORT TERM GOAL #2   Title  Pt to demonstrate 5 degrees hip flexion 10 degrees hip abdct ROM.  Baseline  lacks 5 degrees from neutral in both    Time  4    Period  Weeks    Status  New    Target Date  06/25/19        PT Long Term Goals - 05/28/19 1057      PT LONG TERM GOAL #1   Title  Pt to demonstrates 15 degrees Rt hip ABDCT, 10 degrees Rt hip extension P/ROM to facilitate prosthesis motor control.    Time  8    Period  Weeks    Status  New    Target Date  07/23/19      PT LONG TERM GOAL #2   Title  Pt to demonstrate 5/5 strength hip extension at 0 degrees flexion/extension    Time  8    Period  Weeks    Status  New    Target Date  07/23/19      PT LONG TERM GOAL #3   Title  Pt able to perfrom 5x STS from chair with RW, arms ad lib in <21sec    Time  8    Period  Weeks    Status  New    Target Date  07/23/19            Plan - 07/04/19 [id number]    Clinical Impression Statement  Pt demonstrates excellent motivation during session today. He continues to have some difficulty getting his residual RLE fully seated into the socket but with assist in standing pt able to get within approximately 2 inches of the bottom of his socket in standing. He is able to perform repeated sit to stands with therapist today and improves his ability to lock/unlock his R prosthetic knee with cues. He is able to perform some limited side stepping with therapist today and will likely but appropriate  to attempt a few small forward/backward steps in the parallel bars in the near future. Pt will benefit from PT services to address deficits in strength, balance, and mobility in order to return to full function at home.    Personal Factors and Comorbidities  Age;Fitness;Past/Current Experience;Education;Transportation    Examination-Activity Limitations  Transfers;Dressing;Squat;Stairs;Stand;Bed Mobility    Examination-Participation Restrictions  Meal Prep;Cleaning;Community Activity;Driving;Yard Work;Laundry;Shop    Stability/Clinical Decision Making  Stable/Uncomplicated    Rehab Potential  Good    PT Frequency  2x / week    PT Duration  8 weeks    PT Treatment/Interventions  ADLs/Self Care Home Management;Cryotherapy;Electrical Stimulation;DME Instruction;Gait training;Stair training;Functional mobility training;Therapeutic activities;Therapeutic exercise;Balance training;Neuromuscular re-education;Cognitive remediation;Patient/family education;Prosthetic Training;Wheelchair mobility training;Passive range of motion;Dry needling;Scar mobilization;Spinal Manipulations;Joint Manipulations    PT Next Visit Plan  Strength, balance, transfers, side stepping, consider forward stepping in // bars with close CGA and chair follow;    PT Home Exercise Plan  Medbridge Access Code: [email or web address] (prone positioning), L single leg bridges, L sidelying R hip abduction, hip isometric adduction    Consulted and Agree with Plan of Care  Patient       Patient will benefit from skilled therapeutic intervention in order to improve the following deficits and impairments:  Decreased activity tolerance, Decreased balance, Decreased mobility, Decreased strength, Decreased cognition, Decreased knowledge of use of DME, Decreased knowledge of precautions, Decreased endurance, Decreased range of motion, Difficulty walking, Decreased safety awareness, Decreased skin integrity, Decreased scar mobility, Hypomobility, Postural  dysfunction, Increased edema, Increased muscle spasms, Increased fascial restricitons, Prosthetic Dependency, Impaired tone, Improper body mechanics  Visit Diagnosis: Difficulty in walking, not elsewhere classified  Unsteadiness on feet     Problem List Patient Active Problem List   Diagnosis Date Noted  . Cardiomyopathy ([place]) 01/22/2019  . CHF (congestive heart failure) ([place]) 01/22/2019  . Coronary disease 01/22/2019  . Above knee amputation of right lower extremity ([place]) 11/13/2018  . Leg ulcer, right, with fat layer exposed ([place]) 10/22/2018  . Cellulitis of right leg 07/21/2018  . Lower limb ulcer, calf, right, limited to breakdown of skin ([place]) 06/08/2018  . PVD (peripheral vascular disease) ([place]) 04/17/2018  . Malnutrition of moderate degree 04/11/2018  . Pressure injury of skin 04/06/2018  . Altered mental status 04/04/2018  . Hypothermia 04/04/2018  . Hemodialysis graft malfunction ([place]) 03/26/2018  . Hypercholesterolemia 02/27/2018  . Diabetes ([place]) 02/27/2018  . Weakness of right lower extremity 01/20/2018  . Fever   . Periumbilical abdominal pain   . Confusion 12/22/2017  . Acute delirium 12/21/2017  . Protein-calorie malnutrition, severe 12/19/2017  . Intractable nausea and vomiting 12/18/2017  . Lymphedema 12/13/2017  . Cellulitis 11/27/2017  . Chest pain 11/19/2017  . Atherosclerosis of native arteries of the extremities with ulceration ([place]) 11/07/2017  . Twitching 01/03/2017  . Elevated troponin 10/02/2015  . Complications, dialysis, catheter, mechanical ([place]) 10/02/2015  . Musculoskeletal chest pain 09/28/2015  . Chronic diastolic CHF (congestive heart failure) ([place]) 09/28/2015  . End stage renal disease ([place]) 10/09/2012  . Hypertension 10/09/2012  . Gout 10/09/2012   Phillips Grout PT, DPT, GCS  Bevin Mayall 07/04/2019, 10:26 AM  [place] MAIN [facility] SERVICES [address] [place], [place], [address] Phone:  [phone]   Fax:  [phone]  Name: Johnny [name]. MRN: [id number] Date of Birth: 21-Feb-1951

## 2019-07-16 ENCOUNTER — Other Ambulatory Visit: Payer: Self-pay

## 2019-07-16 ENCOUNTER — Ambulatory Visit: Payer: Medicare Other | Attending: Nurse Practitioner

## 2019-07-16 DIAGNOSIS — R2681 Unsteadiness on feet: Secondary | ICD-10-CM | POA: Insufficient documentation

## 2019-07-16 DIAGNOSIS — R262 Difficulty in walking, not elsewhere classified: Secondary | ICD-10-CM | POA: Insufficient documentation

## 2019-07-16 NOTE — Therapy (Signed)
[place] MAIN [facility] SERVICES [address] [place], [place], [address] Phone: [phone]   Fax:  [phone]  Physical Therapy Treatment  Patient Details  Name: Johnny [name]. MRN: [id number] Date of Birth: 1951-03-05 Referring Provider (PT): Eulogio Ditch, NP    Encounter Date: 07/16/2019  PT End of Session - 07/16/19 0946    Visit Number  9    Number of Visits  17    Date for PT Re-Evaluation  07/23/19    Authorization Type  [employer] Medicare    Authorization Time Period  05/28/19-07/23/19    Authorization - Visit Number  9    Authorization - Number of Visits  10    PT Start Time  8338    PT Stop Time  1015    PT Time Calculation (min)  40 min    Equipment Utilized During Treatment  Gait belt    Activity Tolerance  Patient tolerated treatment well;No increased pain;Patient limited by fatigue    Behavior During Therapy  [facility] for tasks assessed/performed       Past Medical History:  Diagnosis Date  . Anemia   . Anxiety   . CHF (congestive heart failure) ([place])   . Chronic kidney disease    esrd dialysis m/w/f  . Gout   . Hyperlipidemia   . Hypertension   . Myocardial infarction ([place]) 2010   10 years ago  . 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  . Peripheral vascular disease [facility])     Past Surgical History:  Procedure Laterality Date  . A/V FISTULAGRAM Right 09/06/2018   Procedure: A/V FISTULAGRAM;  Surgeon: Algernon Huxley, MD;  Location: [place] CV LAB;  Service: Cardiovascular;  Laterality: Right;  . A/V SHUNTOGRAM Left 06/21/2017   Procedure: A/V SHUNTOGRAM;  Surgeon: Katha Cabal, MD;  Location: [place] CV LAB;  Service: Cardiovascular;  Laterality: Left;  . A/V SHUNTOGRAM N/A 10/24/2018   Procedure: A/V SHUNTOGRAM;  Surgeon: Algernon Huxley, MD;  Location: [place] CV LAB;  Service: Cardiovascular;  Laterality: N/A;  . ABOVE KNEE LEG AMPUTATION Right 2020  . AMPUTATION Right  10/25/2018   Procedure: AMPUTATION ABOVE KNEE;  Surgeon: Algernon Huxley, MD;  Location: [facility] ORS;  Service: General;  Laterality: Right;  . APPLICATION OF WOUND VAC Right 04/11/2018   Procedure: APPLICATION OF WOUND VAC;  Surgeon: Algernon Huxley, MD;  Location: [facility] ORS;  Service: Vascular;  Laterality: Right;  . AV FISTULA PLACEMENT Left 09/18/2015   Procedure: INSERTION OF ARTERIOVENOUS (AV) GORE-TEX GRAFT ARM ( BRACH/AXILLARY GRAFT W/ INSTANT STICK GRAFT );  Surgeon: Katha Cabal, MD;  Location: [facility] ORS;  Service: Vascular;  Laterality: Left;  . AV FISTULA PLACEMENT Right 07/19/2018   Procedure: INSERTION OF ARTERIOVENOUS (AV) GORE-TEX GRAFT ARM ( BRACHIAL AXILLARY);  Surgeon: Algernon Huxley, MD;  Location: [facility] ORS;  Service: Vascular;  Laterality: Right;  . DIALYSIS FISTULA CREATION Right 10/2017   right chest perm cath  . DIALYSIS/PERMA CATHETER REMOVAL N/A 09/13/2018   Procedure: DIALYSIS/PERMA CATHETER REMOVAL;  Surgeon: Algernon Huxley, MD;  Location: [place] CV LAB;  Service: Cardiovascular;  Laterality: N/A;  . ESOPHAGOGASTRODUODENOSCOPY N/A 12/19/2017   Procedure: ESOPHAGOGASTRODUODENOSCOPY (EGD);  Surgeon: Lin Landsman, MD;  Location: [facility];  Service: Gastroenterology;  Laterality: N/A;  . LOWER EXTREMITY ANGIOGRAPHY Left 11/16/2017   Procedure: LOWER EXTREMITY ANGIOGRAPHY;  Surgeon: Algernon Huxley, MD;  Location: [place] CV LAB;  Service: Cardiovascular;  Laterality: Left;  . LOWER EXTREMITY ANGIOGRAPHY Right 01/18/2018   Procedure: LOWER EXTREMITY ANGIOGRAPHY;  Surgeon: Algernon Huxley, MD;  Location: [place] [facility] LAB;  Service: Cardiovascular;  Laterality: Right;  . LOWER EXTREMITY ANGIOGRAPHY Left 04/02/2018   Procedure: LOWER EXTREMITY ANGIOGRAPHY;  Surgeon: Algernon Huxley, MD;  Location: [place] [facility] LAB;  Service: Cardiovascular;  Laterality: Left;  . LOWER EXTREMITY ANGIOGRAPHY Right 04/09/2018   Procedure: Lower Extremity Angiography with possible  intervention;  Surgeon: Algernon Huxley, MD;  Location: [place] [facility] LAB;  Service: Cardiovascular;  Laterality: Right;  . LOWER EXTREMITY ANGIOGRAPHY Right 07/23/2018   Procedure: Lower Extremity Angiography;  Surgeon: Algernon Huxley, MD;  Location: [place] [facility] LAB;  Service: Cardiovascular;  Laterality: Right;  . LOWER EXTREMITY ANGIOGRAPHY Right 09/13/2018   Procedure: LOWER EXTREMITY ANGIOGRAPHY;  Surgeon: Algernon Huxley, MD;  Location: [place] [facility];  Service: Cardiovascular;  Laterality: Right;  . LOWER EXTREMITY VENOGRAPHY Right 09/13/2018   Procedure: LOWER EXTREMITY VENOGRAPHY;  Surgeon: Algernon Huxley, MD;  Location: [place] [facility];  Service: Cardiovascular;  Laterality: Right;  . PERIPHERAL VASCULAR CATHETERIZATION Left 09/01/2015   Procedure: A/V Shuntogram/Fistulagram;  Surgeon: Katha Cabal, MD;  Location: [place] [facility];  Service: Cardiovascular;  Laterality: Left;  . PERIPHERAL VASCULAR CATHETERIZATION N/A 09/30/2015   Procedure: A/V Shuntogram/Fistulagram with perm cathether removal;  Surgeon: Algernon Huxley, MD;  Location: [place] [facility] LAB;  Service: Cardiovascular;  Laterality: N/A;  . PERIPHERAL VASCULAR CATHETERIZATION Left 09/30/2015   Procedure: A/V Shunt Intervention;  Surgeon: Algernon Huxley, MD;  Location: [place] [facility];  Service: Cardiovascular;  Laterality: Left;  . PERIPHERAL VASCULAR CATHETERIZATION Left 12/03/2015   Procedure: Thrombectomy;  Surgeon: Algernon Huxley, MD;  Location: [place] [facility] LAB;  Service: Cardiovascular;  Laterality: Left;  . PERIPHERAL VASCULAR CATHETERIZATION Left 01/28/2016   Procedure: Thrombectomy;  Surgeon: Algernon Huxley, MD;  Location: [place] [facility] LAB;  Service: Cardiovascular;  Laterality: Left;  . PERIPHERAL VASCULAR CATHETERIZATION N/A 01/28/2016   Procedure: A/V Shuntogram/Fistulagram;  Surgeon: Algernon Huxley, MD;  Location: [place] [facility];  Service: Cardiovascular;  Laterality: N/A;  . SKIN SPLIT GRAFT Right  05/24/2018   Procedure: SKIN GRAFT SPLIT THICKNESS ( RIGHT CALF);  Surgeon: Algernon Huxley, MD;  Location: [facility] OR[facility];  Service: Vascular;  Laterality: Right;  . UPPER EXTREMITY ANGIOGRAPHY  10/24/2018   Procedure: Upper Extremity Angiography;  Surgeon: Algernon Huxley, MD;  Location: [place] [facility];  Service: Cardiovascular;;  . UPPER EXTREMITY ANGIOGRAPHY Right 02/14/2019   Procedure: UPPER EXTREMITY ANGIOGRAPHY;  Surgeon: Algernon Huxley, MD;  Location: [place] [facility];  Service: Cardiovascular;  Laterality: Right;  . WOUND DEBRIDEMENT Right 04/11/2018   Procedure: DEBRIDEMENT WOUND calf muscle and skin;  Surgeon: Algernon Huxley, MD;  Location: [facility] OR[facility];  Service: Vascular;  Laterality: Right;    There were no vitals filed for this visit.  Subjective Assessment - 07/16/19 0944    Subjective  Pt doing well today. Has been workin [employer] some. He said the scoket adjustment has improved the stabbing into the groin. Reports tummy time has been rough due to length, author offers recommendation to break 30 minutes into 5 or 10 minute intervals as needed.    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 Pt DC hospital to SNF for rehab. He has since been  back at home. Pt uses a manual WC or power chair for daily mobility. Pt takes tranportation services to HD MWF. Pt was recently fitted for his AKA prosthesis in Jan 2021.    Currently in Pain?  No/denies       INTERVENTION THIS DATE: -RLE Thomas Test hip flexor stretch, hooklying at edge of plinth; 5x60sec (good progression in ROM) -Hooklying RLE marching 45 degrees to end range 2x15 (cued toward shoulder, rather than toward chin) -Hooklying RLE hip extension end range flexion to 45 degrees 2x15, manual resistance  -Hooklying hip flexoin (RLE) neutral to 60 degrees 2x15 (minA after 10x d/t fatigue) -Hooklying hip extension (RLE) 60 degrees to neutral  2x15 (minA after 10x d/t fatigue)  manual resistance  -right pectineus/adductorlongus manual release x3 minutes -butterfly dropout stretch 2x30sec (hooklying)  -Practice STS elevated surface c RW, practice with mechanics needed for prosthetic lock and unlock. 5x  *Author provides intermittent support of prosthesis to help with passive knee joint control.  Johnny [name] has cramping in hip toward end of hooklying exercises, likely due to fatigue     PT Short Term Goals - 05/28/19 1055      PT SHORT TERM GOAL #1   Title  Pt to demonstrate ability to perfrom STS transfer from chair c RW minGuard assist and lock his RLE knee joint without assist.    Baseline  ModA from elevated surface    Time  4    Period  Weeks    Status  New    Target Date  06/25/19      PT SHORT TERM GOAL #2   Title  Pt to demonstrate 5 degrees hip flexion 10 degrees hip abdct ROM.    Baseline  lacks 5 degrees from neutral in both    Time  4    Period  Weeks    Status  New    Target Date  06/25/19        PT Long Term Goals - 05/28/19 1057      PT LONG TERM GOAL #1   Title  Pt to demonstrates 15 degrees Rt hip ABDCT, 10 degrees Rt hip extension P/ROM to facilitate prosthesis motor control.    Time  8    Period  Weeks    Status  New    Target Date  07/23/19      PT LONG TERM GOAL #2   Title  Pt to demonstrate 5/5 strength hip extension at 0 degrees flexion/extension    Time  8    Period  Weeks    Status  New    Target Date  07/23/19      PT LONG TERM GOAL #3   Title  Pt able to perfrom 5x STS from chair with RW, arms ad lib in <21sec    Time  8    Period  Weeks    Status  New    Target Date  07/23/19            Plan - 07/16/19 0947    Clinical Impression Statement  Conitnued to progress mobility and range specific stength. Pt remains motivated and focused. HEP progression and independence conitnue to improve. Pt still struggles to get to Rt hip extension neutral, now about 5 degrees away from 0, but still has not extension range  which would make difficulty AMB and prosthetic knee control. Response to low load long suration stretching is favorable.    Rehab Potential  Good    PT  Frequency  2x / week    PT Duration  8 weeks    PT Treatment/Interventions  ADLs/Self Care Home Management;Cryotherapy;Electrical Stimulation;DME Instruction;Gait training;Stair training;Functional mobility training;Therapeutic activities;Therapeutic exercise;Balance training;Neuromuscular re-education;Cognitive remediation;Patient/family education;Prosthetic Training;Wheelchair mobility training;Passive range of motion;Dry needling;Scar mobilization;Spinal Manipulations;Joint Manipulations    PT Next Visit Plan  Strength, balance, transfers, side stepping, consider forward stepping in // bars with close CGA and chair follow;    PT Home Exercise Plan  Medbridge Access Code: [email or web address] (prone positioning), L single leg bridges, L sidelying R hip abduction, hip isometric adduction    Consulted and Agree with Plan of Care  Patient       Patient will benefit from skilled therapeutic intervention in order to improve the following deficits and impairments:  Decreased activity tolerance, Decreased balance, Decreased mobility, Decreased strength, Decreased cognition, Decreased knowledge of use of DME, Decreased knowledge of precautions, Decreased endurance, Decreased range of motion, Difficulty walking, Decreased safety awareness, Decreased skin integrity, Decreased scar mobility, Hypomobility, Postural dysfunction, Increased edema, Increased muscle spasms, Increased fascial restricitons, Prosthetic Dependency, Impaired tone, Improper body mechanics  Visit Diagnosis: Difficulty in walking, not elsewhere classified  Unsteadiness on feet     Problem List Patient Active Problem List   Diagnosis Date Noted  . Cardiomyopathy ([place]) 01/22/2019  . CHF (congestive heart failure) ([place]) 01/22/2019  . Coronary disease 01/22/2019  . Above knee amputation of right  lower extremity ([place]) 11/13/2018  . Leg ulcer, right, with fat layer exposed ([place]) 10/22/2018  . Cellulitis of right leg 07/21/2018  . Lower limb ulcer, calf, right, limited to breakdown of skin ([place]) 06/08/2018  . PVD (peripheral vascular disease) ([place]) 04/17/2018  . Malnutrition of moderate degree 04/11/2018  . Pressure injury of skin 04/06/2018  . Altered mental status 04/04/2018  . Hypothermia 04/04/2018  . Hemodialysis graft malfunction ([place]) 03/26/2018  . Hypercholesterolemia 02/27/2018  . Diabetes ([place]) 02/27/2018  . Weakness of right lower extremity 01/20/2018  . Fever   . Periumbilical abdominal pain   . Confusion 12/22/2017  . Acute delirium 12/21/2017  . Protein-calorie malnutrition, severe 12/19/2017  . Intractable nausea and vomiting 12/18/2017  . Lymphedema 12/13/2017  . Cellulitis 11/27/2017  . Chest pain 11/19/2017  . Atherosclerosis of native arteries of the extremities with ulceration ([place]) 11/07/2017  . Twitching 01/03/2017  . Elevated troponin 10/02/2015  . Complications, dialysis, catheter, mechanical ([place]) 10/02/2015  . Musculoskeletal chest pain 09/28/2015  . Chronic diastolic CHF (congestive heart failure) ([place]) 09/28/2015  . End stage renal disease ([place]) 10/09/2012  . Hypertension 10/09/2012  . Gout 10/09/2012   10:18 AM, 07/16/19 Etta Grandchild, PT, DPT Physical Therapist - [place] [phone]     Etta Grandchild 07/16/2019, 9:52 AM  [place] MAIN [facility] SERVICES [address] [place], [place], [address] Phone: [phone]   Fax:  [phone]  Name: Johnny [name]. MRN: [id number] Date of Birth: 01-28-1951

## 2019-07-18 ENCOUNTER — Other Ambulatory Visit: Payer: Self-pay

## 2019-07-18 ENCOUNTER — Ambulatory Visit: Payer: Medicare Other

## 2019-07-18 DIAGNOSIS — R2681 Unsteadiness on feet: Secondary | ICD-10-CM

## 2019-07-18 DIAGNOSIS — R262 Difficulty in walking, not elsewhere classified: Secondary | ICD-10-CM

## 2019-07-18 NOTE — Therapy (Signed)
[place] MAIN [facility] SERVICES [address] [place], [place], [address] Phone: [phone]   Fax:  [phone]  Physical Therapy Progress Note   Dates of reporting period  05/28/19   to   07/18/19  Physical Therapy Treatment  Patient Details  Name: Johnny [name]. MRN: [id number] Date of Birth: 1950-11-02 Referring Provider (PT): Eulogio Ditch, NP    Encounter Date: 07/18/2019  PT End of Session - 07/18/19 0810    Visit Number  10    Number of Visits  17    Date for PT Re-Evaluation  07/23/19    Authorization Type  [employer] Medicare    Authorization Time Period  05/28/19-07/23/19    Authorization - Visit Number  10    Authorization - Number of Visits  10    PT Start Time  0800    PT Stop Time  0840    PT Time Calculation (min)  40 min    Equipment Utilized During Treatment  Gait belt    Activity Tolerance  Patient tolerated treatment well;No increased pain;Patient limited by fatigue    Behavior During Therapy  [facility] for tasks assessed/performed       Past Medical History:  Diagnosis Date  . Anemia   . Anxiety   . CHF (congestive heart failure) ([place])   . Chronic kidney disease    esrd dialysis m/w/f  . Gout   . Hyperlipidemia   . Hypertension   . Myocardial infarction ([place]) 2010   10 years ago  . 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  . Peripheral vascular disease [facility])     Past Surgical History:  Procedure Laterality Date  . A/V FISTULAGRAM Right 09/06/2018   Procedure: A/V FISTULAGRAM;  Surgeon: Algernon Huxley, MD;  Location: [place] CV LAB;  Service: Cardiovascular;  Laterality: Right;  . A/V SHUNTOGRAM Left 06/21/2017   Procedure: A/V SHUNTOGRAM;  Surgeon: Katha Cabal, MD;  Location: [place] CV LAB;  Service: Cardiovascular;  Laterality: Left;  . A/V SHUNTOGRAM N/A 10/24/2018   Procedure: A/V SHUNTOGRAM;  Surgeon: Algernon Huxley, MD;  Location: [place] CV LAB;  Service:  Cardiovascular;  Laterality: N/A;  . ABOVE KNEE LEG AMPUTATION Right 2020  . AMPUTATION Right 10/25/2018   Procedure: AMPUTATION ABOVE KNEE;  Surgeon: Algernon Huxley, MD;  Location: [facility] ORS;  Service: General;  Laterality: Right;  . APPLICATION OF WOUND VAC Right 04/11/2018   Procedure: APPLICATION OF WOUND VAC;  Surgeon: Algernon Huxley, MD;  Location: [facility] ORS;  Service: Vascular;  Laterality: Right;  . AV FISTULA PLACEMENT Left 09/18/2015   Procedure: INSERTION OF ARTERIOVENOUS (AV) GORE-TEX GRAFT ARM ( BRACH/AXILLARY GRAFT W/ INSTANT STICK GRAFT );  Surgeon: Katha Cabal, MD;  Location: [facility] ORS;  Service: Vascular;  Laterality: Left;  . AV FISTULA PLACEMENT Right 07/19/2018   Procedure: INSERTION OF ARTERIOVENOUS (AV) GORE-TEX GRAFT ARM ( BRACHIAL AXILLARY);  Surgeon: Algernon Huxley, MD;  Location: [facility] ORS;  Service: Vascular;  Laterality: Right;  . DIALYSIS FISTULA CREATION Right 10/2017   right chest perm cath  . DIALYSIS/PERMA CATHETER REMOVAL N/A 09/13/2018   Procedure: DIALYSIS/PERMA CATHETER REMOVAL;  Surgeon: Algernon Huxley, MD;  Location: [place] CV LAB;  Service: Cardiovascular;  Laterality: N/A;  . ESOPHAGOGASTRODUODENOSCOPY N/A 12/19/2017   Procedure: ESOPHAGOGASTRODUODENOSCOPY (EGD);  Surgeon: Lin Landsman, MD;  Location: [facility] ENDOSCOPY;  Service: Gastroenterology;  Laterality: N/A;  . LOWER EXTREMITY ANGIOGRAPHY Left  11/16/2017   Procedure: LOWER EXTREMITY ANGIOGRAPHY;  Surgeon: Algernon Huxley, MD;  Location: [place] [facility] LAB;  Service: Cardiovascular;  Laterality: Left;  . LOWER EXTREMITY ANGIOGRAPHY Right 01/18/2018   Procedure: LOWER EXTREMITY ANGIOGRAPHY;  Surgeon: Algernon Huxley, MD;  Location: [place] [facility] LAB;  Service: Cardiovascular;  Laterality: Right;  . LOWER EXTREMITY ANGIOGRAPHY Left 04/02/2018   Procedure: LOWER EXTREMITY ANGIOGRAPHY;  Surgeon: Algernon Huxley, MD;  Location: [place] [facility] LAB;  Service: Cardiovascular;  Laterality: Left;  . LOWER  EXTREMITY ANGIOGRAPHY Right 04/09/2018   Procedure: Lower Extremity Angiography with possible intervention;  Surgeon: Algernon Huxley, MD;  Location: [place] [facility] LAB;  Service: Cardiovascular;  Laterality: Right;  . LOWER EXTREMITY ANGIOGRAPHY Right 07/23/2018   Procedure: Lower Extremity Angiography;  Surgeon: Algernon Huxley, MD;  Location: [place] CV LAB;  Service: Cardiovascular;  Laterality: Right;  . LOWER EXTREMITY ANGIOGRAPHY Right 09/13/2018   Procedure: LOWER EXTREMITY ANGIOGRAPHY;  Surgeon: Algernon Huxley, MD;  Location: [place] [facility] LAB;  Service: Cardiovascular;  Laterality: Right;  . LOWER EXTREMITY VENOGRAPHY Right 09/13/2018   Procedure: LOWER EXTREMITY VENOGRAPHY;  Surgeon: Algernon Huxley, MD;  Location: [place] CV LAB;  Service: Cardiovascular;  Laterality: Right;  . PERIPHERAL VASCULAR CATHETERIZATION Left 09/01/2015   Procedure: A/V Shuntogram/Fistulagram;  Surgeon: Katha Cabal, MD;  Location: [place] [facility] LAB;  Service: Cardiovascular;  Laterality: Left;  . PERIPHERAL VASCULAR CATHETERIZATION N/A 09/30/2015   Procedure: A/V Shuntogram/Fistulagram with perm cathether removal;  Surgeon: Algernon Huxley, MD;  Location: [place] [facility];  Service: Cardiovascular;  Laterality: N/A;  . PERIPHERAL VASCULAR CATHETERIZATION Left 09/30/2015   Procedure: A/V Shunt Intervention;  Surgeon: Algernon Huxley, MD;  Location: [place] [facility] LAB;  Service: Cardiovascular;  Laterality: Left;  . PERIPHERAL VASCULAR CATHETERIZATION Left 12/03/2015   Procedure: Thrombectomy;  Surgeon: Algernon Huxley, MD;  Location: [place] CV LAB;  Service: Cardiovascular;  Laterality: Left;  . PERIPHERAL VASCULAR CATHETERIZATION Left 01/28/2016   Procedure: Thrombectomy;  Surgeon: Algernon Huxley, MD;  Location: [place] CV LAB;  Service: Cardiovascular;  Laterality: Left;  . PERIPHERAL VASCULAR CATHETERIZATION N/A 01/28/2016   Procedure: A/V Shuntogram/Fistulagram;  Surgeon: Algernon Huxley, MD;  Location:  [place] CV LAB;  Service: Cardiovascular;  Laterality: N/A;  . SKIN SPLIT GRAFT Right 05/24/2018   Procedure: SKIN GRAFT SPLIT THICKNESS ( RIGHT CALF);  Surgeon: Algernon Huxley, MD;  Location: [facility] ORS;  Service: Vascular;  Laterality: Right;  . UPPER EXTREMITY ANGIOGRAPHY  10/24/2018   Procedure: Upper Extremity Angiography;  Surgeon: Algernon Huxley, MD;  Location: [place] CV LAB;  Service: Cardiovascular;;  . UPPER EXTREMITY ANGIOGRAPHY Right 02/14/2019   Procedure: UPPER EXTREMITY ANGIOGRAPHY;  Surgeon: Algernon Huxley, MD;  Location: [place] CV LAB;  Service: Cardiovascular;  Laterality: Right;  . WOUND DEBRIDEMENT Right 04/11/2018   Procedure: DEBRIDEMENT WOUND calf muscle and skin;  Surgeon: Algernon Huxley, MD;  Location: [facility] ORS;  Service: Vascular;  Laterality: Right;    There were no vitals filed for this visit.  Subjective Assessment - 07/18/19 0808    Subjective  Pt doing ok today, reports to be a bit sore in right hip from last session. Pt reports he tried breaking his stretching regimen into shorter segments with good success.    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 Pt DC hospital to SNF for rehab.  He has since been back at home. Pt uses a manual WC or power chair for daily mobility. Pt takes tranportation services to HD MWF. Pt was recently fitted for his AKA prosthesis in Jan 2021.    Currently in Pain?  No/denies       INTERVENTION THIS DATE:  -Nustep: 3 minutes level 0, 2 minutes level 1; SPM>65; lateral scoot transfers mina for chair alignment, no physical assist during transfer.  *Post Exercise Vitals: 128/63 SpO2 100%, BPM 83  -W/C tricep push ups, 2x10, cues for upright trunk, scapular neutral -LLE LAQ c 5lb AW 2x10  -STS from WC c RW, review of knee lock and unlock maneuvers (several times in session, minguard assist)  -Standing RLE marching c RW and lock-in-stance 2x8, modA for foot  positioning, cues for upright posture -Standing LLE marching c RW 2x15 (3" toe taps) minguard assist at Right tibial block +Rt pelvis -Sequential STS transfers, RW, Chair + 2 airex, heavy-max effort to achieve knee lock, heavy effort and heavy BUE support to rise; 2x3         PT Short Term Goals - 07/18/19 0910      PT SHORT TERM GOAL #1   Title  Pt to demonstrate ability to perfrom STS transfer from chair c RW minGuard assist and lock his RLE knee joint without assist.    Baseline  ModA from elevated surface at eval;   Now able to perform with supervision from chair +2 airexpads.   Time  4    Period  Weeks    Status  Partially Met    Target Date  06/25/19      PT SHORT TERM GOAL #2   Title  Pt to demonstrate 5 degrees hip flexion 10 degrees hip abdct ROM.    Baseline  lacks 5 degrees from neutral in both at eval; At visit 10, has 0 degrees flexion/extension; still lacking 5 degrees ABDCT neutral    Time  4    Period  Weeks    Status  Partially Met    Target Date  06/25/19        PT Long Term Goals - 07/18/19 0912      PT LONG TERM GOAL #1   Title  Pt to demonstrates 15 degrees Rt hip ABDCT, 10 degrees Rt hip extension P/ROM to facilitate prosthesis motor control.    Time  8    Period  Weeks    Status  On-going    Target Date  07/23/19      PT LONG TERM GOAL #2   Title  Pt to demonstrate 5/5 strength hip extension at 0 degrees flexion/extension    Baseline  has 5/5 in some ranges, but not in ranges specific to standing or gait.    Time  8    Period  Weeks    Status  On-going    Target Date  07/23/19      PT LONG TERM GOAL #3   Title  Pt able to perfrom 5x STS from chair with RW, arms ad lib in <21sec    Baseline  At visit 10 requires chair +2 pads +RW    Time  8    Period  Weeks    Status  On-going    Target Date  07/23/19            Plan - 07/18/19 0811    Clinical Impression Statement Trial of Nustep today for reciprocal limb patterning and improving  activity tolerance  needed for pregait training. Pt tolerates session well in general, adequate rest breaks offered as most interventions are moderate intensity in nature. Extensive cues for mechanics needed for transfers- weight shifting, locking, unlocking, correcting foot alignment, but pt has trouble with ST memory, although he follows the cues beautifully. No gross LOB this session. Pt relies heavily on RW for stability, but gradually is able to improve weight shift and loading of prosthesis in single limb scenarios when fatigue is not a limiter. Pt did great with Nustep today for a first attempt. Session went for a full hour since there was an opening in the schedule. STS transfers remain very limited in general, will require some work. Consider transition to // bars next session for continued weight shifting, marching, potentially some FWD AMB.    Stability/Clinical Decision Making  Stable/Uncomplicated    Rehab Potential  Good    PT Frequency  2x / week    PT Duration  8 weeks    PT Treatment/Interventions  ADLs/Self Care Home Management;Cryotherapy;Electrical Stimulation;DME Instruction;Gait training;Stair training;Functional mobility training;Therapeutic activities;Therapeutic exercise;Balance training;Neuromuscular re-education;Cognitive remediation;Patient/family education;Prosthetic Training;Wheelchair mobility training;Passive range of motion;Dry needling;Scar mobilization;Spinal Manipulations;Joint Manipulations    PT Next Visit Plan  Strength, balance, transfers, side stepping, consider forward stepping in // bars with close CGA and chair follow;    PT Home Exercise Plan  Medbridge Access Code: [email or web address] (prone positioning), L single leg bridges, L sidelying R hip abduction, hip isometric adduction    Consulted and Agree with Plan of Care  Patient       Patient will benefit from skilled therapeutic intervention in order to improve the following deficits and impairments:  Decreased activity  tolerance, Decreased balance, Decreased mobility, Decreased strength, Decreased cognition, Decreased knowledge of use of DME, Decreased knowledge of precautions, Decreased endurance, Decreased range of motion, Difficulty walking, Decreased safety awareness, Decreased skin integrity, Decreased scar mobility, Hypomobility, Postural dysfunction, Increased edema, Increased muscle spasms, Increased fascial restricitons, Prosthetic Dependency, Impaired tone, Improper body mechanics  Visit Diagnosis: Difficulty in walking, not elsewhere classified  Unsteadiness on feet     Problem List Patient Active Problem List   Diagnosis Date Noted  . Cardiomyopathy ([place]) 01/22/2019  . CHF (congestive heart failure) ([place]) 01/22/2019  . Coronary disease 01/22/2019  . Above knee amputation of right lower extremity ([place]) 11/13/2018  . Leg ulcer, right, with fat layer exposed ([place]) 10/22/2018  . Cellulitis of right leg 07/21/2018  . Lower limb ulcer, calf, right, limited to breakdown of skin ([place]) 06/08/2018  . PVD (peripheral vascular disease) ([place]) 04/17/2018  . Malnutrition of moderate degree 04/11/2018  . Pressure injury of skin 04/06/2018  . Altered mental status 04/04/2018  . Hypothermia 04/04/2018  . Hemodialysis graft malfunction ([place]) 03/26/2018  . Hypercholesterolemia 02/27/2018  . Diabetes ([place]) 02/27/2018  . Weakness of right lower extremity 01/20/2018  . Fever   . Periumbilical abdominal pain   . Confusion 12/22/2017  . Acute delirium 12/21/2017  . Protein-calorie malnutrition, severe 12/19/2017  . Intractable nausea and vomiting 12/18/2017  . Lymphedema 12/13/2017  . Cellulitis 11/27/2017  . Chest pain 11/19/2017  . 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 Twitching 01/03/2017  . Elevated troponin 10/02/2015  . Complications, dialysis, catheter, mechanical ([place]) 10/02/2015  . Musculoskeletal chest pain 09/28/2015  . Chronic diastolic  CHF (congestive heart failure) ([place]) 09/28/2015  . End stage renal disease ([place]) 10/09/2012  . Hypertension 10/09/2012  . Gout 10/09/2012   9:08 AM, 07/18/19 [place]  Kalman Drape, PT, DPT Physical Therapist - [facility]  Outpatient Physical [place] [phone]     Etta Grandchild 07/18/2019, 8:47 AM  [place] MAIN [facility] SERVICES [address] [place], [place], [address] Phone: [phone]   Fax:  [phone]  Name: Johnny [name]. MRN: [id number] Date of Birth: [birth year]

## 2019-07-23 ENCOUNTER — Ambulatory Visit: Payer: Medicare Other

## 2019-07-23 ENCOUNTER — Other Ambulatory Visit: Payer: Self-pay

## 2019-07-23 DIAGNOSIS — R262 Difficulty in walking, not elsewhere classified: Secondary | ICD-10-CM | POA: Diagnosis not present

## 2019-07-23 DIAGNOSIS — R2681 Unsteadiness on feet: Secondary | ICD-10-CM

## 2019-07-23 NOTE — Therapy (Signed)
[place] MAIN [facility] SERVICES [address] [place], [place], [address] Phone: [phone]   Fax:  [phone]  Physical Therapy Treatment  Patient Details  Name: Johnny [name]. MRN: [id number] Date of Birth: 15-Feb-1951 Referring Provider (PT): Eulogio Ditch, NP    Encounter Date: 07/23/2019  PT End of Session - 07/23/19 0813    Visit Number  11    Number of Visits  17    Date for PT Re-Evaluation  07/23/19    Authorization Type  [employer] Medicare    Authorization Time Period  05/28/19-07/23/19    Authorization - Visit Number  --    Authorization - Number of Visits  --    PT Start Time  0800    PT Stop Time  0845    PT Time Calculation (min)  45 min    Equipment Utilized During Treatment  Gait belt    Activity Tolerance  Patient tolerated treatment well;No increased pain;Patient limited by fatigue    Behavior During Therapy  [facility] for tasks assessed/performed       Past Medical History:  Diagnosis Date  . Anemia   . Anxiety   . CHF (congestive heart failure) ([place])   . Chronic kidney disease    esrd dialysis m/w/f  . Gout   . Hyperlipidemia   . Hypertension   . Myocardial infarction ([place]) 2010   10 years ago  . 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  . Peripheral vascular disease [facility])     Past Surgical History:  Procedure Laterality Date  . A/V FISTULAGRAM Right 09/06/2018   Procedure: A/V FISTULAGRAM;  Surgeon: Algernon Huxley, MD;  Location: [place] CV LAB;  Service: Cardiovascular;  Laterality: Right;  . A/V SHUNTOGRAM Left 06/21/2017   Procedure: A/V SHUNTOGRAM;  Surgeon: Katha Cabal, MD;  Location: [place] CV LAB;  Service: Cardiovascular;  Laterality: Left;  . A/V SHUNTOGRAM N/A 10/24/2018   Procedure: A/V SHUNTOGRAM;  Surgeon: Algernon Huxley, MD;  Location: [place] CV LAB;  Service: Cardiovascular;  Laterality: N/A;  . ABOVE KNEE LEG AMPUTATION Right 2020  . AMPUTATION  Right 10/25/2018   Procedure: AMPUTATION ABOVE KNEE;  Surgeon: Algernon Huxley, MD;  Location: [facility] ORS;  Service: General;  Laterality: Right;  . APPLICATION OF WOUND VAC Right 04/11/2018   Procedure: APPLICATION OF WOUND VAC;  Surgeon: Algernon Huxley, MD;  Location: [facility] ORS;  Service: Vascular;  Laterality: Right;  . AV FISTULA PLACEMENT Left 09/18/2015   Procedure: INSERTION OF ARTERIOVENOUS (AV) GORE-TEX GRAFT ARM ( BRACH/AXILLARY GRAFT W/ INSTANT STICK GRAFT );  Surgeon: Katha Cabal, MD;  Location: [facility] ORS;  Service: Vascular;  Laterality: Left;  . AV FISTULA PLACEMENT Right 07/19/2018   Procedure: INSERTION OF ARTERIOVENOUS (AV) GORE-TEX GRAFT ARM ( BRACHIAL AXILLARY);  Surgeon: Algernon Huxley, MD;  Location: [facility] ORS;  Service: Vascular;  Laterality: Right;  . DIALYSIS FISTULA CREATION Right 10/2017   right chest perm cath  . DIALYSIS/PERMA CATHETER REMOVAL N/A 09/13/2018   Procedure: DIALYSIS/PERMA CATHETER REMOVAL;  Surgeon: Algernon Huxley, MD;  Location: [place] CV LAB;  Service: Cardiovascular;  Laterality: N/A;  . ESOPHAGOGASTRODUODENOSCOPY N/A 12/19/2017   Procedure: ESOPHAGOGASTRODUODENOSCOPY (EGD);  Surgeon: Lin Landsman, MD;  Location: [facility] ENDOSCOPY;  Service: Gastroenterology;  Laterality: N/A;  . LOWER EXTREMITY ANGIOGRAPHY Left 11/16/2017   Procedure: LOWER EXTREMITY ANGIOGRAPHY;  Surgeon: Algernon Huxley, MD;  Location: [place] CV LAB;  Service: Cardiovascular;  Laterality: Left;  . LOWER EXTREMITY ANGIOGRAPHY Right 01/18/2018   Procedure: LOWER EXTREMITY ANGIOGRAPHY;  Surgeon: Algernon Huxley, MD;  Location: [place] CV LAB;  Service: Cardiovascular;  Laterality: Right;  . LOWER EXTREMITY ANGIOGRAPHY Left 04/02/2018   Procedure: LOWER EXTREMITY ANGIOGRAPHY;  Surgeon: Algernon Huxley, MD;  Location: [place] [facility];  Service: Cardiovascular;  Laterality: Left;  . LOWER EXTREMITY ANGIOGRAPHY Right 04/09/2018   Procedure: Lower Extremity Angiography with possible  intervention;  Surgeon: Algernon Huxley, MD;  Location: [place] [facility] LAB;  Service: Cardiovascular;  Laterality: Right;  . LOWER EXTREMITY ANGIOGRAPHY Right 07/23/2018   Procedure: Lower Extremity Angiography;  Surgeon: Algernon Huxley, MD;  Location: [place] CV LAB;  Service: Cardiovascular;  Laterality: Right;  . LOWER EXTREMITY ANGIOGRAPHY Right 09/13/2018   Procedure: LOWER EXTREMITY ANGIOGRAPHY;  Surgeon: Algernon Huxley, MD;  Location: [place] [facility] LAB;  Service: Cardiovascular;  Laterality: Right;  . LOWER EXTREMITY VENOGRAPHY Right 09/13/2018   Procedure: LOWER EXTREMITY VENOGRAPHY;  Surgeon: Algernon Huxley, MD;  Location: [place] CV LAB;  Service: Cardiovascular;  Laterality: Right;  . PERIPHERAL VASCULAR CATHETERIZATION Left 09/01/2015   Procedure: A/V Shuntogram/Fistulagram;  Surgeon: Katha Cabal, MD;  Location: [place] [facility] LAB;  Service: Cardiovascular;  Laterality: Left;  . PERIPHERAL VASCULAR CATHETERIZATION N/A 09/30/2015   Procedure: A/V Shuntogram/Fistulagram with perm cathether removal;  Surgeon: Algernon Huxley, MD;  Location: [place] CV LAB;  Service: Cardiovascular;  Laterality: N/A;  . PERIPHERAL VASCULAR CATHETERIZATION Left 09/30/2015   Procedure: A/V Shunt Intervention;  Surgeon: Algernon Huxley, MD;  Location: [place] [facility] LAB;  Service: Cardiovascular;  Laterality: Left;  . PERIPHERAL VASCULAR CATHETERIZATION Left 12/03/2015   Procedure: Thrombectomy;  Surgeon: Algernon Huxley, MD;  Location: [place] CV LAB;  Service: Cardiovascular;  Laterality: Left;  . PERIPHERAL VASCULAR CATHETERIZATION Left 01/28/2016   Procedure: Thrombectomy;  Surgeon: Algernon Huxley, MD;  Location: [place] [facility] LAB;  Service: Cardiovascular;  Laterality: Left;  . PERIPHERAL VASCULAR CATHETERIZATION N/A 01/28/2016   Procedure: A/V Shuntogram/Fistulagram;  Surgeon: Algernon Huxley, MD;  Location: [place] CV LAB;  Service: Cardiovascular;  Laterality: N/A;  . SKIN SPLIT GRAFT Right  05/24/2018   Procedure: SKIN GRAFT SPLIT THICKNESS ( RIGHT CALF);  Surgeon: Algernon Huxley, MD;  Location: [facility] ORS;  Service: Vascular;  Laterality: Right;  . UPPER EXTREMITY ANGIOGRAPHY  10/24/2018   Procedure: Upper Extremity Angiography;  Surgeon: Algernon Huxley, MD;  Location: [place] [facility] LAB;  Service: Cardiovascular;;  . UPPER EXTREMITY ANGIOGRAPHY Right 02/14/2019   Procedure: UPPER EXTREMITY ANGIOGRAPHY;  Surgeon: Algernon Huxley, MD;  Location: [place] [facility] LAB;  Service: Cardiovascular;  Laterality: Right;  . WOUND DEBRIDEMENT Right 04/11/2018   Procedure: DEBRIDEMENT WOUND calf muscle and skin;  Surgeon: Algernon Huxley, MD;  Location: [facility] ORS;  Service: Vascular;  Laterality: Right;    There were no vitals filed for this visit.  Subjective Assessment - 07/23/19 0812    Subjective  Pt doing ok today, reports to be a bit sore in right hip after last session but not currently. He reports compliance with HEP. No falls since last therapy session.    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 Pt DC hospital to SNF for rehab. He has since been back at home. Pt uses a manual WC or power chair for daily mobility. Pt takes  tranportation services to HD MWF. Pt was recently fitted for his AKA prosthesis in Jan 2021.    Currently in Pain?  No/denies         TREATMENT   Ther-ex  Initial sit to stand from elevated mat table in order for therapist to assist in seated RLE farther down into socket; Seated marches x 20 bilateral; Seated clams with manual resistance x 20 bilateral; Seated adductor squeeze with manual resistance x 20 bilateral; Seated L LAQ with manual resistance from therapist x 10; Repeated sit to stand from elevated mat table with RW, repeated cues with first 3 attempts for safe hand placement during both phases of transfer as well as cues to lock out R knee prosthetic once in standing x 5; Standing weight  shifts in RW x multiple bouts to each side; Standing weight shifting in // bars x multiple bouts to each side; Standing marches in // bars with cues initially to remember to lock RLE prosthetic once in standing x 5 bilateral;   Gait Training Performed gait training with patient in // bars. Education with patient regarding proper sequencing with hands on bars and to initiate by unlocking R knee, utilizing momentum to create heel strike on the right, and to lock out right knee. Then shift weight onto RLE and step with LLE utilizing step-to pattern. Pt requires repeated cues for R hip extension to lock RLE after initial heel strike. Performed 2 lengths of // bars with CGA and second therapist following with wheelchair.    Pt educated throughout session about proper posture and technique with exercises. Improved exercise technique, movement at target joints, use of target muscles after min to mod verbal, visual, tactile cues.    Pt continues to require assistance with seating RLE into prosthesis completely. He demonstrates improved strength with sit to stand transfers but does requires cues for safe hand placement. He is able to initiate gait training in parallel bars with therapist. Education with patient regarding proper sequencing with hands on bars and to initiate by unlocking R knee, utilizing momentum to create heel strike on the right, and to lock out right knee. Then shift weight onto RLE and step with LLE utilizing step-to pattern. Pt requires repeated cues for R hip extension to lock RLE after initial heel strike. Performed 2 lengths of // bars with CGA and second therapist following with wheelchair. Pt reports that he has tried some ambulation at home with his rolling walker but therapist advised him to avoid attempting ambulation at home until he can safely perform with a rolling walker during therapy session with therapist. Pt will benefit from PT services to address deficits in strength,  balance, and mobility in order to return to full function at home.                          PT Short Term Goals - 07/18/19 0910      PT SHORT TERM GOAL #1   Title  Pt to demonstrate ability to perfrom STS transfer from chair c RW minGuard assist and lock his RLE knee joint without assist.    Baseline  ModA from elevated surface at eval;   Now able to perform with supervision from chair +2 airexpads.   Time  4    Period  Weeks    Status  Partially Met    Target Date  06/25/19      PT SHORT TERM GOAL #2   Title  Pt to demonstrate 5 degrees hip flexion 10 degrees hip abdct ROM.    Baseline  lacks 5 degrees from neutral in both at eval; At visit 10, has 0 degrees flexion/extension; still lacking 5 degrees ABDCT neutral    Time  4    Period  Weeks    Status  Partially Met    Target Date  06/25/19        PT Long Term Goals - 07/18/19 0912      PT LONG TERM GOAL #1   Title  Pt to demonstrates 15 degrees Rt hip ABDCT, 10 degrees Rt hip extension P/ROM to facilitate prosthesis motor control.    Time  8    Period  Weeks    Status  On-going    Target Date  07/23/19      PT LONG TERM GOAL #2   Title  Pt to demonstrate 5/5 strength hip extension at 0 degrees flexion/extension    Baseline  has 5/5 in some ranges, but not in ranges specific to standing or gait.    Time  8    Period  Weeks    Status  On-going    Target Date  07/23/19      PT LONG TERM GOAL #3   Title  Pt able to perfrom 5x STS from chair with RW, arms ad lib in <21sec    Baseline  At visit 10 requires chair +2 pads +RW    Time  8    Period  Weeks    Status  On-going    Target Date  07/23/19            Plan - 07/23/19 0814    Clinical Impression Statement  Pt continues to require assistance with seating RLE into prosthesis completely. He demonstrates improved strength with sit to stand transfers but does requires cues for safe hand placement. He is able to initiate gait training in  parallel bars with therapist. Education with patient regarding proper sequencing with hands on bars and to initiate by unlocking R knee, utilizing momentum to create heel strike on the right, and to lock out right knee. Then shift weight onto RLE and step with LLE utilizing step-to pattern. Pt requires repeated cues for R hip extension to lock RLE after initial heel strike. Performed 2 lengths of // bars with CGA and second therapist following with wheelchair. Pt reports that he has tried some ambulation at home with his rolling walker but therapist advised him to avoid attempting ambulation at home until he can safely perform with a rolling walker during therapy session with therapist. Pt will benefit from PT services to address deficits in strength, balance, and mobility in order to return to full function at home.    Personal Factors and Comorbidities  Age;Fitness;Past/Current Experience;Education;Transportation    Examination-Activity Limitations  Transfers;Dressing;Squat;Stairs;Stand;Bed Mobility    Examination-Participation Restrictions  Meal Prep;Cleaning;Community Activity;Driving;Yard Work;Laundry;Shop    Stability/Clinical Decision Making  Stable/Uncomplicated    Rehab Potential  Good    PT Frequency  2x / week    PT Duration  8 weeks    PT Treatment/Interventions  ADLs/Self Care Home Management;Cryotherapy;Electrical Stimulation;DME Instruction;Gait training;Stair training;Functional mobility training;Therapeutic activities;Therapeutic exercise;Balance training;Neuromuscular re-education;Cognitive remediation;Patient/family education;Prosthetic Training;Wheelchair mobility training;Passive range of motion;Dry needling;Scar mobilization;Spinal Manipulations;Joint Manipulations    PT Next Visit Plan  Strength, balance, transfers, side stepping, consider forward stepping in // bars with close CGA and chair follow;    PT Home Exercise Plan  Medbridge Access Code: [email or web address] (prone  positioning), L  single leg bridges, L sidelying R hip abduction, hip isometric adduction    Consulted and Agree with Plan of Care  Patient       Patient will benefit from skilled therapeutic intervention in order to improve the following deficits and impairments:  Decreased activity tolerance, Decreased balance, Decreased mobility, Decreased strength, Decreased cognition, Decreased knowledge of use of DME, Decreased knowledge of precautions, Decreased endurance, Decreased range of motion, Difficulty walking, Decreased safety awareness, Decreased skin integrity, Decreased scar mobility, Hypomobility, Postural dysfunction, Increased edema, Increased muscle spasms, Increased fascial restricitons, Prosthetic Dependency, Impaired tone, Improper body mechanics  Visit Diagnosis: Difficulty in walking, not elsewhere classified  Unsteadiness on feet     Problem List Patient Active Problem List   Diagnosis Date Noted  . Cardiomyopathy ([place]) 01/22/2019  . CHF (congestive heart failure) ([place]) 01/22/2019  . Coronary disease 01/22/2019  . Above knee amputation of right lower extremity ([place]) 11/13/2018  . Leg ulcer, right, with fat layer exposed ([place]) 10/22/2018  . Cellulitis of right leg 07/21/2018  . Lower limb ulcer, calf, right, limited to breakdown of skin ([place]) 06/08/2018  . PVD (peripheral vascular disease) ([place]) 04/17/2018  . Malnutrition of moderate degree 04/11/2018  . Pressure injury of skin 04/06/2018  . Altered mental status 04/04/2018  . Hypothermia 04/04/2018  . Hemodialysis graft malfunction ([place]) 03/26/2018  . Hypercholesterolemia 02/27/2018  . Diabetes ([place]) 02/27/2018  . Weakness of right lower extremity 01/20/2018  . Fever   . Periumbilical abdominal pain   . Confusion 12/22/2017  . Acute delirium 12/21/2017  . Protein-calorie malnutrition, severe 12/19/2017  . Intractable nausea and vomiting 12/18/2017  . Lymphedema 12/13/2017  . Cellulitis 11/27/2017  . Chest pain 11/19/2017  .  Atherosclerosis of native arteries of the extremities with ulceration ([place]) 11/07/2017  . Twitching 01/03/2017  . Elevated troponin 10/02/2015  . Complications, dialysis, catheter, mechanical ([place]) 10/02/2015  . Musculoskeletal chest pain 09/28/2015  . Chronic diastolic CHF (congestive heart failure) ([place]) 09/28/2015  . End stage renal disease ([place]) 10/09/2012  . Hypertension 10/09/2012  . Gout 10/09/2012   Phillips Grout PT, DPT, GCS  Marcedes Tech 07/23/2019, 12:22 PM  [place] MAIN [facility] SERVICES [address] [place], [place], [address] Phone: [phone]   Fax:  [phone]  Name: Johnny [name]. MRN: [id number] Date of Birth: 10-Aug-1950

## 2019-07-25 ENCOUNTER — Ambulatory Visit (INDEPENDENT_AMBULATORY_CARE_PROVIDER_SITE_OTHER): Payer: Medicare Other | Admitting: Nurse Practitioner

## 2019-07-25 ENCOUNTER — Other Ambulatory Visit: Payer: Self-pay

## 2019-07-25 ENCOUNTER — Ambulatory Visit (INDEPENDENT_AMBULATORY_CARE_PROVIDER_SITE_OTHER): Payer: Medicare Other

## 2019-07-25 ENCOUNTER — Encounter (INDEPENDENT_AMBULATORY_CARE_PROVIDER_SITE_OTHER): Payer: Self-pay | Admitting: Nurse Practitioner

## 2019-07-25 VITALS — BP 128/71 | HR 80 | Resp 16

## 2019-07-25 DIAGNOSIS — I7025 Atherosclerosis of native arteries of other extremities with ulceration: Secondary | ICD-10-CM | POA: Diagnosis not present

## 2019-07-25 DIAGNOSIS — N186 End stage renal disease: Secondary | ICD-10-CM

## 2019-07-25 DIAGNOSIS — S78111A Complete traumatic amputation at level between right hip and knee, initial encounter: Secondary | ICD-10-CM

## 2019-07-25 DIAGNOSIS — I739 Peripheral vascular disease, unspecified: Secondary | ICD-10-CM | POA: Diagnosis not present

## 2019-07-25 DIAGNOSIS — Z992 Dependence on renal dialysis: Secondary | ICD-10-CM

## 2019-07-26 ENCOUNTER — Telehealth (INDEPENDENT_AMBULATORY_CARE_PROVIDER_SITE_OTHER): Payer: Self-pay | Admitting: Vascular Surgery

## 2019-07-26 NOTE — Telephone Encounter (Signed)
No he is not.  The patient had good flow within his fistula as well as his leg.  We have no procedures planned.  We will have him follow up in 6 months, unless something changes.  This was also discussed with Johnny [name] as well.

## 2019-07-26 NOTE — Telephone Encounter (Signed)
Ms Johnny [name] has been made aware with information from note below

## 2019-07-29 ENCOUNTER — Encounter (INDEPENDENT_AMBULATORY_CARE_PROVIDER_SITE_OTHER): Payer: Self-pay | Admitting: Nurse Practitioner

## 2019-07-29 NOTE — Progress Notes (Signed)
SUBJECTIVE:  Patient ID: Johnny Bucco., male    DOB: 08/11/1950, 69 y.o.   MRN: [id number] Chief Complaint  Patient presents with  . Follow-up    ultrasound follow up    HPI  Johnny Mozer. is a 69 y.o. male The patient returns to the office for followup of their dialysis access. The function of the access has been stable. The patient denies increased bleeding time or increased recirculation. Patient denies difficulty with cannulation. The patient denies hand pain or other symptoms consistent with steal phenomena.  No significant arm swelling.  The patient returns to the office for followup and review of the noninvasive studies. There have been no interval changes in lower extremity symptoms. No interval shortening of the patient's claudication distance or development of rest pain symptoms. No new ulcers or wounds have occurred since the last visit.  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  He denies any worsening claudication while working with physical therapy.  The patient denies redness or swelling at the access site. The patient denies fever or chills at home or while on dialysis.  The patient denies amaurosis fugax or recent TIA symptoms. There are no recent neurological changes noted. The patient denies claudication symptoms or rest pain symptoms. The patient denies history of DVT, PE or superficial thrombophlebitis. The patient denies recent episodes of angina or shortness of breath.   ABI Rt=n/a and Lt=[place]   (previous ABI's Rt=[place]  and Lt=[place] ) Duplex ultrasound of the left lower extremity has monophasic waveforms in the anterior tibial artery with biphasic waveforms in the posterior tibial artery with good toe waveforms.  The patient has a brachial axillary AV graft with a flow volume of 1218.  AV graft is patent with no areas of significant stenosis.     Past Medical History:    Diagnosis Date  . Anemia   . Anxiety   . CHF (congestive heart failure) ([place])   . Chronic kidney disease    esrd dialysis m/w/f  . Gout   . Hyperlipidemia   . Hypertension   . Myocardial infarction ([place]) 2010   10 years ago  . 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  . Peripheral vascular disease [facility])     Past Surgical History:  Procedure Laterality Date  . A/V FISTULAGRAM Right 09/06/2018   Procedure: A/V FISTULAGRAM;  Surgeon: Algernon Huxley, MD;  Location: [place] [facility];  Service: Cardiovascular;  Laterality: Right;  . A/V SHUNTOGRAM Left 06/21/2017   Procedure: A/V SHUNTOGRAM;  Surgeon: Katha Cabal, MD;  Location: [place] [facility];  Service: Cardiovascular;  Laterality: Left;  . A/V SHUNTOGRAM N/A 10/24/2018   Procedure: A/V SHUNTOGRAM;  Surgeon: Algernon Huxley, MD;  Location: [place] [facility];  Service: Cardiovascular;  Laterality: N/A;  . ABOVE KNEE LEG AMPUTATION Right 2020  . AMPUTATION Right 10/25/2018   Procedure: AMPUTATION ABOVE KNEE;  Surgeon: Algernon Huxley, MD;  Location: [facility];  Service: General;  Laterality: Right;  . APPLICATION OF WOUND VAC Right 04/11/2018   Procedure: APPLICATION OF WOUND VAC;  Surgeon: Algernon Huxley, MD;  Location: [facility];  Service: Vascular;  Laterality: Right;  . AV FISTULA PLACEMENT Left 09/18/2015   Procedure: INSERTION OF ARTERIOVENOUS (AV) GORE-TEX GRAFT ARM ( BRACH/AXILLARY GRAFT W/ INSTANT STICK GRAFT );  Surgeon: Katha Cabal, MD;  Location: [facility];  Service: Vascular;  Laterality: Left;  . AV FISTULA PLACEMENT Right 07/19/2018   Procedure: INSERTION OF ARTERIOVENOUS (AV) GORE-TEX GRAFT ARM ( BRACHIAL AXILLARY);  Surgeon: Algernon Huxley, MD;  Location: [facility];  Service: Vascular;  Laterality: Right;  . DIALYSIS FISTULA CREATION Right 10/2017   right chest perm cath  . DIALYSIS/PERMA CATHETER REMOVAL N/A 09/13/2018   Procedure: DIALYSIS/PERMA CATHETER REMOVAL;  Surgeon: Algernon Huxley, MD;  Location: [place] [facility];  Service: Cardiovascular;  Laterality: N/A;  . ESOPHAGOGASTRODUODENOSCOPY N/A 12/19/2017   Procedure: ESOPHAGOGASTRODUODENOSCOPY (EGD);  Surgeon: Lin Landsman, MD;  Location: [facility];  Service: Gastroenterology;  Laterality: N/A;  . LOWER EXTREMITY ANGIOGRAPHY Left 11/16/2017   Procedure: LOWER EXTREMITY ANGIOGRAPHY;  Surgeon: Algernon Huxley, MD;  Location: [name] [facility];  Service: Cardiovascular;  Laterality: Left;  . LOWER EXTREMITY ANGIOGRAPHY Right 01/18/2018   Procedure: LOWER EXTREMITY ANGIOGRAPHY;  Surgeon: Algernon Huxley, MD;  Location: [place] [facility];  Service: Cardiovascular;  Laterality: Right;  . LOWER EXTREMITY ANGIOGRAPHY Left 04/02/2018   Procedure: LOWER EXTREMITY ANGIOGRAPHY;  Surgeon: Algernon Huxley, MD;  Location: [place] [facility];  Service: Cardiovascular;  Laterality: Left;  . LOWER EXTREMITY ANGIOGRAPHY Right 04/09/2018   Procedure: Lower Extremity Angiography with possible intervention;  Surgeon: Algernon Huxley, MD;  Location: [place] [facility];  Service: Cardiovascular;  Laterality: Right;  . LOWER EXTREMITY ANGIOGRAPHY Right 07/23/2018   Procedure: Lower Extremity Angiography;  Surgeon: Algernon Huxley, MD;  Location: [place] [facility];  Service: Cardiovascular;  Laterality: Right;  . LOWER EXTREMITY ANGIOGRAPHY Right 09/13/2018   Procedure: LOWER EXTREMITY ANGIOGRAPHY;  Surgeon: Algernon Huxley, MD;  Location: [place] [facility];  Service: Cardiovascular;  Laterality: Right;  . LOWER EXTREMITY VENOGRAPHY Right 09/13/2018   Procedure: LOWER EXTREMITY VENOGRAPHY;  Surgeon: Algernon Huxley, MD;  Location: [place] [facility];  Service: Cardiovascular;  Laterality: Right;  . PERIPHERAL VASCULAR CATHETERIZATION Left 09/01/2015   Procedure: A/V Shuntogram/Fistulagram;  Surgeon: Katha Cabal, MD;  Location: [place] [facility];  Service: Cardiovascular;  Laterality: Left;  . PERIPHERAL VASCULAR CATHETERIZATION N/A  09/30/2015   Procedure: A/V Shuntogram/Fistulagram with perm cathether removal;  Surgeon: Algernon Huxley, MD;  Location: [place] [facility];  Service: Cardiovascular;  Laterality: N/A;  . PERIPHERAL VASCULAR CATHETERIZATION Left 09/30/2015   Procedure: A/V Shunt Intervention;  Surgeon: Algernon Huxley, MD;  Location: [place] [facility];  Service: Cardiovascular;  Laterality: Left;  . PERIPHERAL VASCULAR CATHETERIZATION Left 12/03/2015   Procedure: Thrombectomy;  Surgeon: Algernon Huxley, MD;  Location: [place] [facility];  Service: Cardiovascular;  Laterality: Left;  . PERIPHERAL VASCULAR CATHETERIZATION Left 01/28/2016   Procedure: Thrombectomy;  Surgeon: Algernon Huxley, MD;  Location: [place] [facility];  Service: Cardiovascular;  Laterality: Left;  . PERIPHERAL VASCULAR CATHETERIZATION N/A 01/28/2016   Procedure: A/V Shuntogram/Fistulagram;  Surgeon: Algernon Huxley, MD;  Location: [place] [facility];  Service: Cardiovascular;  Laterality: N/A;  . SKIN SPLIT GRAFT Right 05/24/2018   Procedure: SKIN GRAFT SPLIT THICKNESS ( RIGHT CALF);  Surgeon: Algernon Huxley, MD;  Location: [facility];  Service: Vascular;  Laterality: Right;  . UPPER EXTREMITY ANGIOGRAPHY  10/24/2018   Procedure: Upper Extremity Angiography;  Surgeon: Algernon Huxley, MD;  Location: [place] [facility];  Service: Cardiovascular;;  . UPPER EXTREMITY ANGIOGRAPHY Right 02/14/2019   Procedure: UPPER EXTREMITY ANGIOGRAPHY;  Surgeon: Algernon Huxley, MD;  Location: [place] [facility];  Service: Cardiovascular;  Laterality: Right;  . WOUND DEBRIDEMENT Right 04/11/2018   Procedure: DEBRIDEMENT WOUND calf muscle and skin;  Surgeon: Algernon Huxley, MD;  Location: [facility];  Service: Vascular;  Laterality: Right;    Social History   Socioeconomic History  . Marital status: Single    Spouse name: Not on file  . Number of children: 0  . Years of education: Not on file  . Highest education level: Not on file  Occupational History  . Occupation: retired     Comment: Medical laboratory scientific officer  Tobacco Use  . Smoking status: Former Smoker    Types: Cigarettes    Quit date: 05/17/2005    Years since quitting: 14.2  . Smokeless tobacco: Never Used  Substance and Sexual Activity  . Alcohol use: No  . Drug use: No  . Sexual activity: Not Currently  Other Topics Concern  . Not on file  Social History Narrative  . Not on file   Social Determinants of Health   Financial Resource Strain:   . Difficulty of Paying Living Expenses:   Food Insecurity:   . Worried About Charity fundraiser in the Last Year:   . Arboriculturist in the Last Year:   Transportation Needs:   . Film/video editor (Medical):   [name] Lack of Transportation (Non-Medical):   Physical Activity:   . Days of Exercise per Week:   . Minutes of Exercise per Session:   Stress:   . Feeling of Stress :   Social Connections:   . Frequency of Communication with Friends and Family:   . Frequency of Social Gatherings with Friends and Family:   . Attends Religious Services:   . Active Member of Clubs or Organizations:   . Attends Archivist Meetings:   [name] Marital Status:   Intimate Partner Violence:   . Fear of Current or Ex-Partner:   . Emotionally Abused:   [name] Physically Abused:   . Sexually Abused:     Family History  Problem Relation Age of Onset  . Hypertension Other   . Heart disease Other   . Diabetes Mother     Allergies  Allergen Reactions  . Shellfish Allergy Anaphylaxis     Review of Systems   Review of Systems: Negative Unless Checked Constitutional: [] Weight loss  [] Fever  [] Chills Cardiac: [] Chest pain   []  Atrial Fibrillation  [] Palpitations   [] Shortness of breath when laying flat   [] Shortness of breath with exertion. [] Shortness of breath at rest Vascular:  [] Pain in legs with walking   [] Pain in legs with standing [] Pain in legs when laying flat   [] Claudication    [] Pain in feet when laying flat    [] History of DVT   [] Phlebitis    [x] Swelling in legs   [] Varicose veins   [] Non-healing ulcers Pulmonary:   [] Uses home oxygen   [] Productive cough   [] Hemoptysis   [] Wheeze  [] COPD   [] Asthma Neurologic:  [] Dizziness   [] Seizures  [] Blackouts [] History of stroke   [] History of TIA  [] Aphasia   [] Temporary Blindness   [] Weakness or numbness in arm   [x] Weakness or numbness in leg Musculoskeletal:   [] Joint swelling   [] Joint pain   [] Low back pain  []  History of Knee Replacement [] Arthritis [] back Surgeries  []  Spinal Stenosis    Hematologic:  [] Easy bruising  [] Easy bleeding   [] Hypercoagulable state   [x] Anemic Gastrointestinal:  [] Diarrhea   [] Vomiting  [] Gastroesophageal  reflux/heartburn   [] Difficulty swallowing. [] Abdominal pain Genitourinary:  [x] Chronic kidney disease   [] Difficult urination  [] Anuric   [] Blood in urine [] Frequent urination  [] Burning with urination   [] Hematuria Skin:  [] Rashes   [] Ulcers [] Wounds Psychological:  [] History of anxiety   []  History of major depression  []  Memory Difficulties      OBJECTIVE:   Physical Exam  BP 128/71 (BP Location: Left Arm)   Pulse 80   Resp 16   Gen: WD/WN, NAD Head: [place]/AT, No temporalis wasting.  Ear/Nose/Throat: Hearing grossly intact, nares w/o erythema or drainage Eyes: PER, EOMI, sclera nonicteric.  Neck: Supple, no masses.  No JVD.  Pulmonary:  Good air movement, no use of accessory muscles.  Cardiac: RRR Vascular: good thrill and bruit. No welling  Vessel Right Left  Radial Palpable Palpable  Dorsalis Pedis  Palpable  Posterior Tibial  Palpable   Gastrointestinal: soft, non-distended. No guarding/no peritoneal signs.  Musculoskeletal: M/S 5/5 throughout.  No deformity or atrophy.  Neurologic: Pain and light touch intact in extremities.  Symmetrical.  Speech is fluent. Motor exam as listed above. Psychiatric: Judgment intact, Mood & affect appropriate for pt's clinical situation. Dermatologic: No Venous rashes. No Ulcers Noted.  No changes  consistent with cellulitis. Lymph : No Cervical lymphadenopathy, no lichenification or skin changes of chronic lymphedema.       ASSESSMENT AND PLAN:  1. End stage renal disease ([place]) Recommend:  The patient is doing well and currently has adequate dialysis access. The patient's dialysis center is not reporting any access issues. Flow pattern is stable when compared to the prior ultrasound.  The patient should have a duplex ultrasound of the dialysis access in 6 months. The patient will follow-up with me in the office after each ultrasound     2. Atherosclerosis of native arteries of the extremities with ulceration ([place])  Recommend:  The patient has evidence of atherosclerosis of the lower extremities with claudication.  The patient does not voice lifestyle limiting changes at this point in time.  Noninvasive studies do not suggest clinically significant change.  No invasive studies, angiography or surgery at this time The patient should continue walking and begin a more formal exercise program.  The patient should continue antiplatelet therapy and aggressive treatment of the lipid abnormalities  No changes in the patient's medications at this time  The patient should continue wearing graduated compression socks 10-15 mmHg strength to control the mild edema.    3. Above knee amputation of right lower extremity ([place]) Currently working with physical therapy with his prosthesis.  Overall doing well.   Current Outpatient Medications on File Prior to Visit  Medication Sig Dispense Refill  . allopurinol (ZYLOPRIM) 100 MG tablet Take 100 mg by mouth daily.    [name] amLODipine (NORVASC) 5 MG tablet Take 1 tablet (5 mg total) by mouth daily. 30 tablet 0  . aspirin EC 81 MG tablet Take 1 tablet (81 mg total) by mouth daily. 150 tablet 2  . atorvastatin (LIPITOR) 10 MG tablet Take 1 tablet by mouth daily.    . clonazePAM (KLONOPIN) 0.5 MG tablet Take 1 tablet (0.5 mg total) by mouth as  needed. 10 tablet 0  . clopidogrel (PLAVIX) 75 MG tablet Take 1 tablet (75 mg total) by mouth daily. 30 tablet 11  . folic acid-vitamin b complex-vitamin c-selenium-zinc (DIALYVITE) 3 MG TABS tablet Take 1 tablet by mouth daily.     [name] gabapentin (NEURONTIN) 100 MG capsule Take 1 capsule (100  mg total) by mouth at bedtime. (Patient taking differently: Take 100 mg by mouth 2 (two) times daily. )    . losartan (COZAAR) 100 MG tablet Take 100 mg by mouth daily.    . metoprolol tartrate (LOPRESSOR) 25 MG tablet Take 25 mg by mouth every evening.    . Multiple Vitamin (MULTIVITAMIN) LIQD Take 5 mLs by mouth daily.    . sevelamer carbonate (RENVELA) 800 MG tablet Take 2,400 mg by mouth 3 (three) times daily with meals.     [name] HYDROcodone-acetaminophen (NORCO) 5-325 MG tablet Take 1 tablet by mouth every 6 (six) hours as needed for moderate pain. (Patient not taking: Reported on 02/14/2019) 20 tablet 0   No current facility-administered medications on file prior to visit.    There are no Patient Instructions on file for this visit. No follow-ups on file.   Kris Hartmann, NP  This note was completed with Sales executive.  Any errors are purely unintentional.

## 2019-07-30 ENCOUNTER — Other Ambulatory Visit: Payer: Self-pay

## 2019-07-30 ENCOUNTER — Ambulatory Visit: Payer: Medicare Other

## 2019-07-30 DIAGNOSIS — R262 Difficulty in walking, not elsewhere classified: Secondary | ICD-10-CM | POA: Diagnosis not present

## 2019-07-30 DIAGNOSIS — R2681 Unsteadiness on feet: Secondary | ICD-10-CM

## 2019-07-30 NOTE — Therapy (Addendum)
[place] MAIN [facility] SERVICES [address] [place], [place], [address] Phone: [phone]   Fax:  [phone]  Physical Therapy Treatment  Patient Details  Name: Johnny [name]. MRN: [id number] Date of Birth: 04-21-1951 Referring Provider (PT): Eulogio Ditch, NP    Encounter Date: 07/30/2019    PT End of Session - 07/30/19 1106    Visit Number  12    Number of Visits  41   Date for PT Re-Evaluation  10/22/19   Authorization Type  [employer] Medicare    Authorization Time Period  05/28/19-07/23/19    PT Start Time  1015    PT Stop Time  1100    PT Time Calculation (min)  45 min    Equipment Utilized During Treatment  Gait belt    Activity Tolerance  Patient tolerated treatment well;No increased pain;Patient limited by fatigue    Behavior During Therapy  [facility] for tasks assessed/performed       Past Medical History:  Diagnosis Date  . Anemia   . Anxiety   . CHF (congestive heart failure) ([place])   . Chronic kidney disease    esrd dialysis m/w/f  . Gout   . Hyperlipidemia   . Hypertension   . Myocardial infarction ([place]) 2010   10 years ago  . 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  . Peripheral vascular disease [facility])     Past Surgical History:  Procedure Laterality Date  . A/V FISTULAGRAM Right 09/06/2018   Procedure: A/V FISTULAGRAM;  Surgeon: Algernon Huxley, MD;  Location: [place] CV LAB;  Service: Cardiovascular;  Laterality: Right;  . A/V SHUNTOGRAM Left 06/21/2017   Procedure: A/V SHUNTOGRAM;  Surgeon: Katha Cabal, MD;  Location: [place] CV LAB;  Service: Cardiovascular;  Laterality: Left;  . A/V SHUNTOGRAM N/A 10/24/2018   Procedure: A/V SHUNTOGRAM;  Surgeon: Algernon Huxley, MD;  Location: [place] CV LAB;  Service: Cardiovascular;  Laterality: N/A;  . ABOVE KNEE LEG AMPUTATION Right 2020  . AMPUTATION Right 10/25/2018   Procedure: AMPUTATION ABOVE KNEE;  Surgeon: Algernon Huxley, MD;   Location: [facility] ORS;  Service: General;  Laterality: Right;  . APPLICATION OF WOUND VAC Right 04/11/2018   Procedure: APPLICATION OF WOUND VAC;  Surgeon: Algernon Huxley, MD;  Location: [facility] ORS;  Service: Vascular;  Laterality: Right;  . AV FISTULA PLACEMENT Left 09/18/2015   Procedure: INSERTION OF ARTERIOVENOUS (AV) GORE-TEX GRAFT ARM ( BRACH/AXILLARY GRAFT W/ INSTANT STICK GRAFT );  Surgeon: Katha Cabal, MD;  Location: [facility] ORS;  Service: Vascular;  Laterality: Left;  . AV FISTULA PLACEMENT Right 07/19/2018   Procedure: INSERTION OF ARTERIOVENOUS (AV) GORE-TEX GRAFT ARM ( BRACHIAL AXILLARY);  Surgeon: Algernon Huxley, MD;  Location: [facility] ORS;  Service: Vascular;  Laterality: Right;  . DIALYSIS FISTULA CREATION Right 10/2017   right chest perm cath  . DIALYSIS/PERMA CATHETER REMOVAL N/A 09/13/2018   Procedure: DIALYSIS/PERMA CATHETER REMOVAL;  Surgeon: Algernon Huxley, MD;  Location: [place] CV LAB;  Service: Cardiovascular;  Laterality: N/A;  . ESOPHAGOGASTRODUODENOSCOPY N/A 12/19/2017   Procedure: ESOPHAGOGASTRODUODENOSCOPY (EGD);  Surgeon: Lin Landsman, MD;  Location: [facility] ENDOSCOPY;  Service: Gastroenterology;  Laterality: N/A;  . LOWER EXTREMITY ANGIOGRAPHY Left 11/16/2017   Procedure: LOWER EXTREMITY ANGIOGRAPHY;  Surgeon: Algernon Huxley, MD;  Location: [place] CV LAB;  Service: Cardiovascular;  Laterality: Left;  . LOWER EXTREMITY ANGIOGRAPHY Right 01/18/2018   Procedure: LOWER EXTREMITY ANGIOGRAPHY;  Surgeon: Algernon Huxley, MD;  Location: [place] CV LAB;  Service: Cardiovascular;  Laterality: Right;  . LOWER EXTREMITY ANGIOGRAPHY Left 04/02/2018   Procedure: LOWER EXTREMITY ANGIOGRAPHY;  Surgeon: Algernon Huxley, MD;  Location: [place] CV LAB;  Service: Cardiovascular;  Laterality: Left;  . LOWER EXTREMITY ANGIOGRAPHY Right 04/09/2018   Procedure: Lower Extremity Angiography with possible intervention;  Surgeon: Algernon Huxley, MD;  Location: [place] CV LAB;  Service:  Cardiovascular;  Laterality: Right;  . LOWER EXTREMITY ANGIOGRAPHY Right 07/23/2018   Procedure: Lower Extremity Angiography;  Surgeon: Algernon Huxley, MD;  Location: [place] CV LAB;  Service: Cardiovascular;  Laterality: Right;  . LOWER EXTREMITY ANGIOGRAPHY Right 09/13/2018   Procedure: LOWER EXTREMITY ANGIOGRAPHY;  Surgeon: Algernon Huxley, MD;  Location: [place] CV LAB;  Service: Cardiovascular;  Laterality: Right;  . LOWER EXTREMITY VENOGRAPHY Right 09/13/2018   Procedure: LOWER EXTREMITY VENOGRAPHY;  Surgeon: Algernon Huxley, MD;  Location: [place] CV LAB;  Service: Cardiovascular;  Laterality: Right;  . PERIPHERAL VASCULAR CATHETERIZATION Left 09/01/2015   Procedure: A/V Shuntogram/Fistulagram;  Surgeon: Katha Cabal, MD;  Location: [place] CV LAB;  Service: Cardiovascular;  Laterality: Left;  . PERIPHERAL VASCULAR CATHETERIZATION N/A 09/30/2015   Procedure: A/V Shuntogram/Fistulagram with perm cathether removal;  Surgeon: Algernon Huxley, MD;  Location: [place] CV LAB;  Service: Cardiovascular;  Laterality: N/A;  . PERIPHERAL VASCULAR CATHETERIZATION Left 09/30/2015   Procedure: A/V Shunt Intervention;  Surgeon: Algernon Huxley, MD;  Location: [place] CV LAB;  Service: Cardiovascular;  Laterality: Left;  . PERIPHERAL VASCULAR CATHETERIZATION Left 12/03/2015   Procedure: Thrombectomy;  Surgeon: Algernon Huxley, MD;  Location: [place] CV LAB;  Service: Cardiovascular;  Laterality: Left;  . PERIPHERAL VASCULAR CATHETERIZATION Left 01/28/2016   Procedure: Thrombectomy;  Surgeon: Algernon Huxley, MD;  Location: [place] CV LAB;  Service: Cardiovascular;  Laterality: Left;  . PERIPHERAL VASCULAR CATHETERIZATION N/A 01/28/2016   Procedure: A/V Shuntogram/Fistulagram;  Surgeon: Algernon Huxley, MD;  Location: [place] CV LAB;  Service: Cardiovascular;  Laterality: N/A;  . SKIN SPLIT GRAFT Right 05/24/2018   Procedure: SKIN GRAFT SPLIT THICKNESS ( RIGHT CALF);  Surgeon: Algernon Huxley, MD;  Location: [facility] ORS;  Service: Vascular;  Laterality: Right;  . UPPER EXTREMITY ANGIOGRAPHY  10/24/2018   Procedure: Upper Extremity Angiography;  Surgeon: Algernon Huxley, MD;  Location: [place] CV LAB;  Service: Cardiovascular;;  . UPPER EXTREMITY ANGIOGRAPHY Right 02/14/2019   Procedure: UPPER EXTREMITY ANGIOGRAPHY;  Surgeon: Algernon Huxley, MD;  Location: [place] CV LAB;  Service: Cardiovascular;  Laterality: Right;  . WOUND DEBRIDEMENT Right 04/11/2018   Procedure: DEBRIDEMENT WOUND calf muscle and skin;  Surgeon: Algernon Huxley, MD;  Location: [facility] ORS;  Service: Vascular;  Laterality: Right;    There were no vitals filed for this visit.  Subjective Assessment - 07/30/19 1037    Subjective  Pt doing ok today. No pain reported upon arrival. He states that he had some soreness the morning after his last therapy session but it resolved quickly. He reports compliance with HEP. No falls since last therapy session. No specific questions or concers upon arrival.    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 Pt DC hospital to SNF for rehab. He has since been back at home. Pt uses a manual WC or power chair for daily mobility. Pt takes tranportation services to HD  MWF. Pt was recently fitted for his AKA prosthesis in Jan 2021.    Currently in Pain?  No/denies         TREATMENT   Ther-ex  Performed stand pivot transfers x 2 with patient using RW, CGA, and mod to max verbal and tactile cues to transfer wheelchair to/from mat table; Initial sit to stand from elevated mat table in order for therapist to assist in seated RLE farther down into socket, had to perform a second time due to velcro coming loose during strengthening; Seated marches x 20 bilateral; Seated clams with manual resistance x 15 bilateral; Seated adductor squeeze with manual resistance x 15 bilateral; Seated L LAQ with manual resistance from  therapist x 15; Supine hip flexion marching x 15 bilateral; Supine R straight leg manually resisted hip abduction/adduction x 10 each; Hooklying knee bridges with bolster under knee 3s hold x 10;   Gait Training Performed gait training with patient in // bars. Education with patient regarding proper sequencing with hands on bars and to initiate by unlocking R knee, utilizing momentum to create heel strike on the right, and to lock out right knee. Then shift weight onto RLE and step with LLE utilizing step-to pattern. Pt requires repeated cues for R hip extension to lock RLE after initial heel strike. Performed 2 lengths of // bars with CGA and therapy tech following with wheelchair. Performed an additional length of bars with FWW with associated education for stepping using assistive device. Progressed to ambulation outside of parallel bars with FWW, CGA, and wheelchair follow. Pt requires repeated cues for upright posture, to lock R knee, and for proper sequencing with walker. He does have one episode of R prosthesis buckling due to failure to lock complete prior to weight shifting. Pt demonstrates heavy BUE support on walker handles during gait.    Pt educated throughout session about proper posture and technique with exercises. Improved exercise technique, movement at target joints, use of target muscles after min to mod verbal, visual, tactile cues.    Pt continues to require assistance with seating RLE into prosthesis completely. He demonstrates improved strength with sit to stand transfers but does continue to require cues for safe hand placement. He is able to perform gait training in parallel bars with therapist again today and progresses to ambulation with front wheeled walker first inside of bars and then outside of parallel bars. He requires CGA, verbal/tactile cues, and wheelchair follow for all ambulation. During gait with walker outside of parallel bars pt requires repeated cues for  upright posture, to lock R knee, and for proper sequencing with walker. He does have one episode of R prosthesis buckling due to failure to lock complete prior to weight shifting. Pt demonstrates heavy BUE support on walker handles during gait. Pt advised still not to attempt ambulation at home until he is performing with greater safety/consistency during therapy sessions. Pt advised to continue HEP and follow-up as scheduled. He will benefit from PT services to address deficits in strength, balance, and mobility in order to return to full function at home.                       PT Short Term Goals - 08/01/19 1015      PT SHORT TERM GOAL #1   Title  Pt to demonstrate ability to perfrom STS transfer from chair c RW minGuard assist and lock his RLE knee joint without assist.    Baseline  ModA  from elevated surface at eval;   Now able to perform with supervision from chair +2 airexpads.   Time  4    Period  Weeks    Status  Partially Met    Target Date  06/25/19      PT SHORT TERM GOAL #2   Title  Pt to demonstrate 5 degrees hip flexion 10 degrees hip abdct ROM.    Baseline  lacks 5 degrees from neutral in both at eval; At visit 10, has 0 degrees flexion/extension; still lacking 5 degrees ABDCT neutral    Time  4    Period  Weeks    Status  Partially Met    Target Date  06/25/19           PT Long Term Goals - 08/01/19 1015      PT LONG TERM GOAL #1   Title  Pt to demonstrates 15 degrees Rt hip ABDCT, 10 degrees Rt hip extension P/ROM to facilitate prosthesis motor control.    Time  8    Period  Weeks    Status  On-going    Target Date  10/22/19      PT LONG TERM GOAL #2   Title  Pt to demonstrate 5/5 strength hip extension at 0 degrees flexion/extension    Baseline  has 5/5 in some ranges, but not in ranges specific to standing or gait.    Time  8    Period  Weeks    Status  On-going    Target Date  10/22/19      PT LONG TERM GOAL #3   Title  Pt able to  perfrom 5x STS from chair with RW, arms ad lib in <21sec    Baseline  At visit 10 requires chair +2 pads +RW    Time  8    Period  Weeks    Status  On-going    Target Date  10/22/19         Plan - 07/30/19 1106    Clinical Impression Statement  Pt continues to require assistance with seating RLE into prosthesis completely. He demonstrates improved strength with sit to stand transfers but does continue to require cues for safe hand placement. He is able to perform gait training in parallel bars with therapist again today and progresses to ambulation with front wheeled walker first inside of bars and then outside of parallel bars. He requires CGA, verbal/tactile cues, and wheelchair follow for all ambulation. During gait with walker outside of parallel bars pt requires repeated cues for upright posture, to lock R knee, and for proper sequencing with walker. He does have one episode of R prosthesis buckling due to failure to lock complete prior to weight shifting. Pt demonstrates heavy BUE support on walker handles during gait. Pt advised still not to attempt ambulation at home until he is performing with greater safety/consistency during therapy sessions. Pt advised to continue HEP and follow-up as scheduled. He will benefit from PT services to address deficits in strength, balance, and mobility in order to return to full function at home.    Personal Factors and Comorbidities  Age;Fitness;Past/Current Experience;Education;Transportation    Examination-Activity Limitations  Transfers;Dressing;Squat;Stairs;Stand;Bed Mobility    Examination-Participation Restrictions  Meal Prep;Cleaning;Community Activity;Driving;Yard Work;Laundry;Shop    Stability/Clinical Decision Making  Stable/Uncomplicated    Rehab Potential  Good    PT Frequency  2x / week    PT Duration  8 weeks    PT Treatment/Interventions  ADLs/Self Care Home Management;Cryotherapy;Dealer  Stimulation;DME Instruction;Gait training;Stair  training;Functional mobility training;Therapeutic activities;Therapeutic exercise;Balance training;Neuromuscular re-education;Cognitive remediation;Patient/family education;Prosthetic Training;Wheelchair mobility training;Passive range of motion;Dry needling;Scar mobilization;Spinal Manipulations;Joint Manipulations    PT Next Visit Plan  Strength, balance, transfers, side stepping, consider forward stepping in // bars with close CGA and chair follow;    PT Home Exercise Plan  Medbridge Access Code: [email or web address] (prone positioning), L single leg bridges, L sidelying R hip abduction, hip isometric adduction    Consulted and Agree with Plan of Care  Patient       Patient will benefit from skilled therapeutic intervention in order to improve the following deficits and impairments:  Decreased activity tolerance, Decreased balance, Decreased mobility, Decreased strength, Decreased cognition, Decreased knowledge of use of DME, Decreased knowledge of precautions, Decreased endurance, Decreased range of motion, Difficulty walking, Decreased safety awareness, Decreased skin integrity, Decreased scar mobility, Hypomobility, Postural dysfunction, Increased edema, Increased muscle spasms, Increased fascial restricitons, Prosthetic Dependency, Impaired tone, Improper body mechanics  Visit Diagnosis: Difficulty in walking, not elsewhere classified  Unsteadiness on feet     Problem List Patient Active Problem List   Diagnosis Date Noted  . Cardiomyopathy ([place]) 01/22/2019  . CHF (congestive heart failure) ([place]) 01/22/2019  . Coronary disease 01/22/2019  . Above knee amputation of right lower extremity ([place]) 11/13/2018  . Leg ulcer, right, with fat layer exposed ([place]) 10/22/2018  . Cellulitis of right leg 07/21/2018  . Lower limb ulcer, calf, right, limited to breakdown of skin ([place]) 06/08/2018  . PVD (peripheral vascular disease) ([place]) 04/17/2018  . Malnutrition of moderate degree 04/11/2018  . Pressure  injury of skin 04/06/2018  . Altered mental status 04/04/2018  . Hypothermia 04/04/2018  . Hemodialysis graft malfunction ([place]) 03/26/2018  . Hypercholesterolemia 02/27/2018  . Diabetes ([place]) 02/27/2018  . Weakness of right lower extremity 01/20/2018  . Fever   . Periumbilical abdominal pain   . Confusion 12/22/2017  . Acute delirium 12/21/2017  . Protein-calorie malnutrition, severe 12/19/2017  . Intractable nausea and vomiting 12/18/2017  . Lymphedema 12/13/2017  . Cellulitis 11/27/2017  . Chest pain 11/19/2017  . Atherosclerosis of native arteries of the extremities with ulceration ([place]) 11/07/2017  . Twitching 01/03/2017  . Elevated troponin 10/02/2015  . Complications, dialysis, catheter, mechanical ([place]) 10/02/2015  . Musculoskeletal chest pain 09/28/2015  . Chronic diastolic CHF (congestive heart failure) ([place]) 09/28/2015  . End stage renal disease ([place]) 10/09/2012  . Hypertension 10/09/2012  . Gout 10/09/2012   Phillips Grout PT, DPT, GCS-  Neomia Herbel 07/30/2019, 11:14 AM  [place] MAIN [facility] SERVICES [address] [place], [place], [address] Phone: [phone]   Fax:  [phone]  Name: Johnny [name]. MRN: [id number] Date of Birth: 1950/10/27

## 2019-08-01 ENCOUNTER — Other Ambulatory Visit: Payer: Self-pay

## 2019-08-01 ENCOUNTER — Ambulatory Visit: Payer: Medicare Other

## 2019-08-01 DIAGNOSIS — R2681 Unsteadiness on feet: Secondary | ICD-10-CM

## 2019-08-01 DIAGNOSIS — R262 Difficulty in walking, not elsewhere classified: Secondary | ICD-10-CM | POA: Diagnosis not present

## 2019-08-01 NOTE — Addendum Note (Signed)
Addended by: Roxana Hires D on: 08/01/2019 10:20 AM   Modules accepted: Orders

## 2019-08-01 NOTE — Therapy (Signed)
[place] MAIN [facility] SERVICES [address] [place], [place], [address] Phone: [phone]   Fax:  [phone]  Physical Therapy Treatment  Patient Details  Name: Johnny [name]. MRN: [id number] Date of Birth: 1950/07/16 Referring Provider (PT): Eulogio Ditch, NP    Encounter Date: 08/01/2019  PT End of Session - 08/01/19 0902    Visit Number  13    Number of Visits  41    Date for PT Re-Evaluation  10/22/19    Authorization Type  [employer] Medicare    Authorization Time Period  --    PT Start Time  5277    PT Stop Time  0932    PT Time Calculation (min)  45 min    Equipment Utilized During Treatment  Gait belt    Activity Tolerance  Patient tolerated treatment well;No increased pain    Behavior During Therapy  WFL for tasks assessed/performed       Past Medical History:  Diagnosis Date  . Anemia   . Anxiety   . CHF (congestive heart failure) ([place])   . Chronic kidney disease    esrd dialysis m/w/f  . Gout   . Hyperlipidemia   . Hypertension   . Myocardial infarction ([place]) 2010   10 years ago  . 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  . Peripheral vascular disease [facility])     Past Surgical History:  Procedure Laterality Date  . A/V FISTULAGRAM Right 09/06/2018   Procedure: A/V FISTULAGRAM;  Surgeon: Algernon Huxley, MD;  Location: [place] CV LAB;  Service: Cardiovascular;  Laterality: Right;  . A/V SHUNTOGRAM Left 06/21/2017   Procedure: A/V SHUNTOGRAM;  Surgeon: Katha Cabal, MD;  Location: [place] CV LAB;  Service: Cardiovascular;  Laterality: Left;  . A/V SHUNTOGRAM N/A 10/24/2018   Procedure: A/V SHUNTOGRAM;  Surgeon: Algernon Huxley, MD;  Location: [place] CV LAB;  Service: Cardiovascular;  Laterality: N/A;  . ABOVE KNEE LEG AMPUTATION Right 2020  . AMPUTATION Right 10/25/2018   Procedure: AMPUTATION ABOVE KNEE;  Surgeon: Algernon Huxley, MD;  Location: [facility] ORS;  Service: General;   Laterality: Right;  . APPLICATION OF WOUND VAC Right 04/11/2018   Procedure: APPLICATION OF WOUND VAC;  Surgeon: Algernon Huxley, MD;  Location: [facility] ORS;  Service: Vascular;  Laterality: Right;  . AV FISTULA PLACEMENT Left 09/18/2015   Procedure: INSERTION OF ARTERIOVENOUS (AV) GORE-TEX GRAFT ARM ( BRACH/AXILLARY GRAFT W/ INSTANT STICK GRAFT );  Surgeon: Katha Cabal, MD;  Location: [facility] ORS;  Service: Vascular;  Laterality: Left;  . AV FISTULA PLACEMENT Right 07/19/2018   Procedure: INSERTION OF ARTERIOVENOUS (AV) GORE-TEX GRAFT ARM ( BRACHIAL AXILLARY);  Surgeon: Algernon Huxley, MD;  Location: [facility] ORS;  Service: Vascular;  Laterality: Right;  . DIALYSIS FISTULA CREATION Right 10/2017   right chest perm cath  . DIALYSIS/PERMA CATHETER REMOVAL N/A 09/13/2018   Procedure: DIALYSIS/PERMA CATHETER REMOVAL;  Surgeon: Algernon Huxley, MD;  Location: [place] CV LAB;  Service: Cardiovascular;  Laterality: N/A;  . ESOPHAGOGASTRODUODENOSCOPY N/A 12/19/2017   Procedure: ESOPHAGOGASTRODUODENOSCOPY (EGD);  Surgeon: Lin Landsman, MD;  Location: [facility] ENDOSCOPY;  Service: Gastroenterology;  Laterality: N/A;  . LOWER EXTREMITY ANGIOGRAPHY Left 11/16/2017   Procedure: LOWER EXTREMITY ANGIOGRAPHY;  Surgeon: Algernon Huxley, MD;  Location: [place] CV LAB;  Service: Cardiovascular;  Laterality: Left;  . LOWER EXTREMITY ANGIOGRAPHY Right 01/18/2018   Procedure: LOWER EXTREMITY ANGIOGRAPHY;  Surgeon: Lucky Cowboy,  Erskine Squibb, MD;  Location: [place] CV LAB;  Service: Cardiovascular;  Laterality: Right;  . LOWER EXTREMITY ANGIOGRAPHY Left 04/02/2018   Procedure: LOWER EXTREMITY ANGIOGRAPHY;  Surgeon: Algernon Huxley, MD;  Location: [place] CV LAB;  Service: Cardiovascular;  Laterality: Left;  . LOWER EXTREMITY ANGIOGRAPHY Right 04/09/2018   Procedure: Lower Extremity Angiography with possible intervention;  Surgeon: Algernon Huxley, MD;  Location: [place] CV LAB;  Service: Cardiovascular;  Laterality: Right;  .  LOWER EXTREMITY ANGIOGRAPHY Right 07/23/2018   Procedure: Lower Extremity Angiography;  Surgeon: Algernon Huxley, MD;  Location: [place] CV LAB;  Service: Cardiovascular;  Laterality: Right;  . LOWER EXTREMITY ANGIOGRAPHY Right 09/13/2018   Procedure: LOWER EXTREMITY ANGIOGRAPHY;  Surgeon: Algernon Huxley, MD;  Location: [place] CV LAB;  Service: Cardiovascular;  Laterality: Right;  . LOWER EXTREMITY VENOGRAPHY Right 09/13/2018   Procedure: LOWER EXTREMITY VENOGRAPHY;  Surgeon: Algernon Huxley, MD;  Location: [place] CV LAB;  Service: Cardiovascular;  Laterality: Right;  . PERIPHERAL VASCULAR CATHETERIZATION Left 09/01/2015   Procedure: A/V Shuntogram/Fistulagram;  Surgeon: Katha Cabal, MD;  Location: [place] CV LAB;  Service: Cardiovascular;  Laterality: Left;  . PERIPHERAL VASCULAR CATHETERIZATION N/A 09/30/2015   Procedure: A/V Shuntogram/Fistulagram with perm cathether removal;  Surgeon: Algernon Huxley, MD;  Location: [place] CV LAB;  Service: Cardiovascular;  Laterality: N/A;  . PERIPHERAL VASCULAR CATHETERIZATION Left 09/30/2015   Procedure: A/V Shunt Intervention;  Surgeon: Algernon Huxley, MD;  Location: [place] CV LAB;  Service: Cardiovascular;  Laterality: Left;  . PERIPHERAL VASCULAR CATHETERIZATION Left 12/03/2015   Procedure: Thrombectomy;  Surgeon: Algernon Huxley, MD;  Location: [place] CV LAB;  Service: Cardiovascular;  Laterality: Left;  . PERIPHERAL VASCULAR CATHETERIZATION Left 01/28/2016   Procedure: Thrombectomy;  Surgeon: Algernon Huxley, MD;  Location: [place] CV LAB;  Service: Cardiovascular;  Laterality: Left;  . PERIPHERAL VASCULAR CATHETERIZATION N/A 01/28/2016   Procedure: A/V Shuntogram/Fistulagram;  Surgeon: Algernon Huxley, MD;  Location: [place] CV LAB;  Service: Cardiovascular;  Laterality: N/A;  . SKIN SPLIT GRAFT Right 05/24/2018   Procedure: SKIN GRAFT SPLIT THICKNESS ( RIGHT CALF);  Surgeon: Algernon Huxley, MD;  Location: [facility] ORS;  Service:  Vascular;  Laterality: Right;  . UPPER EXTREMITY ANGIOGRAPHY  10/24/2018   Procedure: Upper Extremity Angiography;  Surgeon: Algernon Huxley, MD;  Location: [place] CV LAB;  Service: Cardiovascular;;  . UPPER EXTREMITY ANGIOGRAPHY Right 02/14/2019   Procedure: UPPER EXTREMITY ANGIOGRAPHY;  Surgeon: Algernon Huxley, MD;  Location: [place] CV LAB;  Service: Cardiovascular;  Laterality: Right;  . WOUND DEBRIDEMENT Right 04/11/2018   Procedure: DEBRIDEMENT WOUND calf muscle and skin;  Surgeon: Algernon Huxley, MD;  Location: [facility] ORS;  Service: Vascular;  Laterality: Right;    There were no vitals filed for this visit.  Subjective Assessment - 08/01/19 0857    Subjective  Pt doing ok today. No pain upon arrival today. Mild soreness the day following our last therapy session. He reports compliance with HEP. No falls since last therapy session. No specific questions or concerns upon arrival.    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 Pt DC hospital to SNF for rehab. He has since been back at home. Pt uses a manual WC or power chair for daily mobility. Pt takes tranportation services to HD MWF. Pt was recently fitted for his AKA prosthesis in Jan  2021.    How long can you sit comfortably?  No difficulty    How long can you stand comfortably?  1 minute    Currently in Pain?  No/denies        TREATMENT   Ther-ex Initial sit to stand from elevated mat table in order for therapist to assist in seated RLE farther down into socket Seated marches 2 x 10 bilateral; Seated clams with manual resistance 2 x 10 bilateral; Seated adductor squeeze with manual resistance 2 x 10 bilateral;   Gait Training Performed gait training with patient using rolling walker. Education with patient regarding gait sequencing including advancing walker, unlocking R knee, utilizing momentum to create heel strike on the right, and to lock out right  knee. Then shift weight onto RLE and step with LLE utilizing step-to pattern. Pt requires repeated cues for R hip extension to lock RLE after initial heel strike. CGA and wheelchair follow. Pt requires repeated cues for upright posture, to lock R knee, and for proper sequencing with walker. No episodes of buckling today. He is able to ambulate 40', 30', and 40.' Pt demonstrates heavy BUE support on walker handles during gait but less compared to previous sessions.    Pt educated throughout session about proper posture and technique with exercises. Improved exercise technique, movement at target joints, use of target muscles after min to mod verbal, visual, tactile cues.   Pt is making excellent progress with therapy and continues to demonstrate excellent motivation. He is able to complete all of his seated exercises with therapy today. He continues to require assistance with fitting RLE into prosthesis completely. He is demonstrating improved strength, sequencing, and safety with sit to stand transfers. Performed gait training with patient using rolling walker today. Education with patient regarding gait sequencing including advancing walker, unlocking R knee, utilizing momentum to create heel strike on the right, and to lock out right knee. Then shift weight onto RLE and step with LLE utilizing step-to pattern. Pt requires repeated cues for R hip extension to lock RLE after initial heel strike. CGA and wheelchair follow. Pt requires repeated cues for upright posture, to lock R knee, and for proper sequencing with walker. No episodes of buckling today. He is able to ambulate 40', 30', and 40.' Pt demonstrates heavy BUE support on walker handles during gait but less compared to previous sessions. Pt encouraged to continue HEP. He will benefit from PT services to address deficits in strength, balance, and mobility in order to return to full function at home.                          PT Short Term Goals - 07/18/19 0910      PT SHORT TERM GOAL #1   Title  Pt to demonstrate ability to perfrom STS transfer from chair c RW minGuard assist and lock his RLE knee joint without assist.    Baseline  ModA from elevated surface at eval;   Now able to perform with supervision from chair +2 airexpads.   Time  4    Period  Weeks    Status  Partially Met    Target Date  06/25/19      PT SHORT TERM GOAL #2   Title  Pt to demonstrate 5 degrees hip flexion 10 degrees hip abdct ROM.    Baseline  lacks 5 degrees from neutral in both at eval; At visit 10, has 0 degrees flexion/extension; still lacking  5 degrees ABDCT neutral    Time  4    Period  Weeks    Status  Partially Met    Target Date  06/25/19        PT Long Term Goals - 07/18/19 0912      PT LONG TERM GOAL #1   Title  Pt to demonstrates 15 degrees Rt hip ABDCT, 10 degrees Rt hip extension P/ROM to facilitate prosthesis motor control.    Time  8    Period  Weeks    Status  On-going    Target Date  07/23/19      PT LONG TERM GOAL #2   Title  Pt to demonstrate 5/5 strength hip extension at 0 degrees flexion/extension    Baseline  has 5/5 in some ranges, but not in ranges specific to standing or gait.    Time  8    Period  Weeks    Status  On-going    Target Date  07/23/19      PT LONG TERM GOAL #3   Title  Pt able to perfrom 5x STS from chair with RW, arms ad lib in <21sec    Baseline  At visit 10 requires chair +2 pads +RW    Time  8    Period  Weeks    Status  On-going    Target Date  07/23/19            Plan - 08/01/19 0959    Clinical Impression Statement  Pt is making excellent progress with therapy and continues to demonstrate excellent motivation. He is able to complete all of his seated exercises with therapy today. He continues to require assistance with fitting RLE into prosthesis completely. He is demonstrating improved strength, sequencing, and  safety with sit to stand transfers. Performed gait training with patient using rolling walker today. Education with patient regarding gait sequencing including advancing walker, unlocking R knee, utilizing momentum to create heel strike on the right, and to lock out right knee. Then shift weight onto RLE and step with LLE utilizing step-to pattern. Pt requires repeated cues for R hip extension to lock RLE after initial heel strike. CGA and wheelchair follow. Pt requires repeated cues for upright posture, to lock R knee, and for proper sequencing with walker. No episodes of buckling today. He is able to ambulate 40', 30', and 40.' Pt demonstrates heavy BUE support on walker handles during gait but less compared to previous sessions. Pt encouraged to continue HEP. He will benefit from PT services to address deficits in strength, balance, and mobility in order to return to full function at home.    Personal Factors and Comorbidities  Age;Fitness;Past/Current Experience;Education;Transportation    Examination-Activity Limitations  Transfers;Dressing;Squat;Stairs;Stand;Bed Mobility    Examination-Participation Restrictions  Meal Prep;Cleaning;Community Activity;Driving;Yard Work;Laundry;Shop    Stability/Clinical Decision Making  Stable/Uncomplicated    Rehab Potential  Good    PT Frequency  2x / week    PT Duration  12 weeks    PT Treatment/Interventions  ADLs/Self Care Home Management;Cryotherapy;Electrical Stimulation;DME Instruction;Gait training;Stair training;Functional mobility training;Therapeutic activities;Therapeutic exercise;Balance training;Neuromuscular re-education;Cognitive remediation;Patient/family education;Prosthetic Training;Wheelchair mobility training;Passive range of motion;Dry needling;Scar mobilization;Spinal Manipulations;Joint Manipulations    PT Next Visit Plan  Strength, balance, transfers, side stepping, consider forward stepping in // bars with close CGA and chair follow;    PT  Home Exercise Plan  Medbridge Access Code: [email or web address] (prone positioning), L single leg bridges, L sidelying R hip abduction, hip isometric adduction  Consulted and Agree with Plan of Care  Patient       Patient will benefit from skilled therapeutic intervention in order to improve the following deficits and impairments:  Decreased activity tolerance, Decreased balance, Decreased mobility, Decreased strength, Decreased cognition, Decreased knowledge of use of DME, Decreased knowledge of precautions, Decreased endurance, Decreased range of motion, Difficulty walking, Decreased safety awareness, Decreased skin integrity, Decreased scar mobility, Hypomobility, Postural dysfunction, Increased edema, Increased muscle spasms, Increased fascial restricitons, Prosthetic Dependency, Impaired tone, Improper body mechanics  Visit Diagnosis: Difficulty in walking, not elsewhere classified  Unsteadiness on feet     Problem List Patient Active Problem List   Diagnosis Date Noted  . Cardiomyopathy ([place]) 01/22/2019  . CHF (congestive heart failure) ([place]) 01/22/2019  . Coronary disease 01/22/2019  . Above knee amputation of right lower extremity ([place]) 11/13/2018  . Leg ulcer, right, with fat layer exposed ([place]) 10/22/2018  . Cellulitis of right leg 07/21/2018  . Lower limb ulcer, calf, right, limited to breakdown of skin ([place]) 06/08/2018  . PVD (peripheral vascular disease) ([place]) 04/17/2018  . Malnutrition of moderate degree 04/11/2018  . Pressure injury of skin 04/06/2018  . Altered mental status 04/04/2018  . Hypothermia 04/04/2018  . Hemodialysis graft malfunction ([place]) 03/26/2018  . Hypercholesterolemia 02/27/2018  . Diabetes ([place]) 02/27/2018  . Weakness of right lower extremity 01/20/2018  . Fever   . Periumbilical abdominal pain   . Confusion 12/22/2017  . Acute delirium 12/21/2017  . Protein-calorie malnutrition, severe 12/19/2017  . Intractable nausea and vomiting 12/18/2017  .  Lymphedema 12/13/2017  . Cellulitis 11/27/2017  . Chest pain 11/19/2017  . Atherosclerosis of native arteries of the extremities with ulceration ([place]) 11/07/2017  . Twitching 01/03/2017  . Elevated troponin 10/02/2015  . Complications, dialysis, catheter, mechanical ([place]) 10/02/2015  . Musculoskeletal chest pain 09/28/2015  . Chronic diastolic CHF (congestive heart failure) ([place]) 09/28/2015  . End stage renal disease ([place]) 10/09/2012  . Hypertension 10/09/2012  . Gout 10/09/2012   Phillips Grout PT, DPT, GCS  Elbie Statzer 08/01/2019, 10:14 AM  [place] MAIN [facility] SERVICES [address] [place], [place], [address] Phone: [phone]   Fax:  [phone]  Name: Tyrion Glaude. MRN: [id number] Date of Birth: 03-18-1951

## 2019-08-06 ENCOUNTER — Other Ambulatory Visit: Payer: Self-pay

## 2019-08-06 ENCOUNTER — Ambulatory Visit: Payer: Medicare Other

## 2019-08-06 DIAGNOSIS — R262 Difficulty in walking, not elsewhere classified: Secondary | ICD-10-CM

## 2019-08-06 DIAGNOSIS — R2681 Unsteadiness on feet: Secondary | ICD-10-CM

## 2019-08-06 NOTE — Therapy (Signed)
[place] MAIN [facility] SERVICES [address] [place], [place], [address] Phone: [phone]   Fax:  [phone]  Physical Therapy Treatment  Patient Details  Name: Johnny [name]. MRN: [id number] Date of Birth: 1951/04/11 Referring Provider (PT): Eulogio Ditch, NP    Encounter Date: 08/06/2019  PT End of Session - 08/06/19 0814    Visit Number  14    Number of Visits  41    Date for PT Re-Evaluation  10/22/19    Authorization Type  [employer] Medicare    PT Start Time  0803    PT Stop Time  0845    PT Time Calculation (min)  42 min    Equipment Utilized During Treatment  Gait belt    Activity Tolerance  Patient tolerated treatment well;No increased pain    Behavior During Therapy  WFL for tasks assessed/performed       Past Medical History:  Diagnosis Date  . Anemia   . Anxiety   . CHF (congestive heart failure) ([place])   . Chronic kidney disease    esrd dialysis m/w/f  . Gout   . Hyperlipidemia   . Hypertension   . Myocardial infarction ([place]) 2010   10 years ago  . 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  . Peripheral vascular disease [facility])     Past Surgical History:  Procedure Laterality Date  . A/V FISTULAGRAM Right 09/06/2018   Procedure: A/V FISTULAGRAM;  Surgeon: Algernon Huxley, MD;  Location: [place] [facility];  Service: Cardiovascular;  Laterality: Right;  . A/V SHUNTOGRAM Left 06/21/2017   Procedure: A/V SHUNTOGRAM;  Surgeon: Katha Cabal, MD;  Location: [place] [facility];  Service: Cardiovascular;  Laterality: Left;  . A/V SHUNTOGRAM N/A 10/24/2018   Procedure: A/V SHUNTOGRAM;  Surgeon: Algernon Huxley, MD;  Location: [place] [facility];  Service: Cardiovascular;  Laterality: N/A;  . ABOVE KNEE LEG AMPUTATION Right 2020  . AMPUTATION Right 10/25/2018   Procedure: AMPUTATION ABOVE KNEE;  Surgeon: Algernon Huxley, MD;  Location: [facility];  Service: General;  Laterality: Right;  .  APPLICATION OF WOUND VAC Right 04/11/2018   Procedure: APPLICATION OF WOUND VAC;  Surgeon: Algernon Huxley, MD;  Location: [facility];  Service: Vascular;  Laterality: Right;  . AV FISTULA PLACEMENT Left 09/18/2015   Procedure: INSERTION OF ARTERIOVENOUS (AV) GORE-TEX GRAFT ARM ( BRACH/AXILLARY GRAFT W/ INSTANT STICK GRAFT );  Surgeon: Katha Cabal, MD;  Location: [facility];  Service: Vascular;  Laterality: Left;  . AV FISTULA PLACEMENT Right 07/19/2018   Procedure: INSERTION OF ARTERIOVENOUS (AV) GORE-TEX GRAFT ARM ( BRACHIAL AXILLARY);  Surgeon: Algernon Huxley, MD;  Location: [facility];  Service: Vascular;  Laterality: Right;  . DIALYSIS FISTULA CREATION Right 10/2017   right chest perm cath  . DIALYSIS/PERMA CATHETER REMOVAL N/A 09/13/2018   Procedure: DIALYSIS/PERMA CATHETER REMOVAL;  Surgeon: Algernon Huxley, MD;  Location: [place] [facility];  Service: Cardiovascular;  Laterality: N/A;  . ESOPHAGOGASTRODUODENOSCOPY N/A 12/19/2017   Procedure: ESOPHAGOGASTRODUODENOSCOPY (EGD);  Surgeon: Lin Landsman, MD;  Location: [facility];  Service: Gastroenterology;  Laterality: N/A;  . LOWER EXTREMITY ANGIOGRAPHY Left 11/16/2017   Procedure: LOWER EXTREMITY ANGIOGRAPHY;  Surgeon: Algernon Huxley, MD;  Location: [place] [facility];  Service: Cardiovascular;  Laterality: Left;  . LOWER EXTREMITY ANGIOGRAPHY Right 01/18/2018   Procedure: LOWER EXTREMITY ANGIOGRAPHY;  Surgeon: Algernon Huxley, MD;  Location: [place] [facility];  Service: Cardiovascular;  Laterality: Right;  . LOWER EXTREMITY ANGIOGRAPHY Left 04/02/2018   Procedure: LOWER EXTREMITY ANGIOGRAPHY;  Surgeon: Algernon Huxley, MD;  Location: [place] [facility];  Service: Cardiovascular;  Laterality: Left;  . LOWER EXTREMITY ANGIOGRAPHY Right 04/09/2018   Procedure: Lower Extremity Angiography with possible intervention;  Surgeon: Algernon Huxley, MD;  Location: [place] [facility];  Service: Cardiovascular;  Laterality: Right;  . LOWER EXTREMITY  ANGIOGRAPHY Right 07/23/2018   Procedure: Lower Extremity Angiography;  Surgeon: Algernon Huxley, MD;  Location: [place] [facility];  Service: Cardiovascular;  Laterality: Right;  . LOWER EXTREMITY ANGIOGRAPHY Right 09/13/2018   Procedure: LOWER EXTREMITY ANGIOGRAPHY;  Surgeon: Algernon Huxley, MD;  Location: [place] [facility];  Service: Cardiovascular;  Laterality: Right;  . LOWER EXTREMITY VENOGRAPHY Right 09/13/2018   Procedure: LOWER EXTREMITY VENOGRAPHY;  Surgeon: Algernon Huxley, MD;  Location: [place] [facility];  Service: Cardiovascular;  Laterality: Right;  . PERIPHERAL VASCULAR CATHETERIZATION Left 09/01/2015   Procedure: A/V Shuntogram/Fistulagram;  Surgeon: Katha Cabal, MD;  Location: [place] [facility];  Service: Cardiovascular;  Laterality: Left;  . PERIPHERAL VASCULAR CATHETERIZATION N/A 09/30/2015   Procedure: A/V Shuntogram/Fistulagram with perm cathether removal;  Surgeon: Algernon Huxley, MD;  Location: [place] [facility];  Service: Cardiovascular;  Laterality: N/A;  . PERIPHERAL VASCULAR CATHETERIZATION Left 09/30/2015   Procedure: A/V Shunt Intervention;  Surgeon: Algernon Huxley, MD;  Location: [place] [facility];  Service: Cardiovascular;  Laterality: Left;  . PERIPHERAL VASCULAR CATHETERIZATION Left 12/03/2015   Procedure: Thrombectomy;  Surgeon: Algernon Huxley, MD;  Location: [place] [facility];  Service: Cardiovascular;  Laterality: Left;  . PERIPHERAL VASCULAR CATHETERIZATION Left 01/28/2016   Procedure: Thrombectomy;  Surgeon: Algernon Huxley, MD;  Location: [place] [facility];  Service: Cardiovascular;  Laterality: Left;  . PERIPHERAL VASCULAR CATHETERIZATION N/A 01/28/2016   Procedure: A/V Shuntogram/Fistulagram;  Surgeon: Algernon Huxley, MD;  Location: [place] [facility];  Service: Cardiovascular;  Laterality: N/A;  . SKIN SPLIT GRAFT Right 05/24/2018   Procedure: SKIN GRAFT SPLIT THICKNESS ( RIGHT CALF);  Surgeon: Algernon Huxley, MD;  Location: [facility];  Service: Vascular;   Laterality: Right;  . UPPER EXTREMITY ANGIOGRAPHY  10/24/2018   Procedure: Upper Extremity Angiography;  Surgeon: Algernon Huxley, MD;  Location: [place] [facility];  Service: Cardiovascular;;  . UPPER EXTREMITY ANGIOGRAPHY Right 02/14/2019   Procedure: UPPER EXTREMITY ANGIOGRAPHY;  Surgeon: Algernon Huxley, MD;  Location: [place] [facility];  Service: Cardiovascular;  Laterality: Right;  . WOUND DEBRIDEMENT Right 04/11/2018   Procedure: DEBRIDEMENT WOUND calf muscle and skin;  Surgeon: Algernon Huxley, MD;  Location: [facility];  Service: Vascular;  Laterality: Right;    There were no vitals filed for this visit.  Subjective Assessment - 08/06/19 0812    Subjective  Pt doing ok today. No pain upon arrival today. Moderate soreness the day following our last therapy session but resolved after one day. He reports compliance with HEP. No falls since last therapy session. He did try a little walking at home since our last therapy session but it was difficult. No specific questions or concerns upon arrival.    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 Pt DC hospital to SNF for rehab. He has since been back at home. Pt uses a manual WC or power chair for daily mobility. Pt takes tranportation  services to HD MWF. Pt was recently fitted for his AKA prosthesis in Jan 2021.    How long can you sit comfortably?  No difficulty    How long can you stand comfortably?  1 minute    Currently in Pain?  No/denies         TREATMENT   Ther-ex Initial sit to stand from elevated mat table in order for therapist to assist in seated RLE farther down into socket Seated marches x 20 bilateral; Seated clams with manual resistance x 20bilateral; Seated adductor squeeze with manual resistance x 20 bilateral; L LAQ with manual resistance x 20; L HS curls with green tband x 20; Hooklying bridges with bolster under L knee and prosthetic R knee, 3s hold x  10; Hooklying R hip flexion marching x 10; L sidelying R hip abduction x 10; L sidelying R hip flexor stretch 30s hold x    Gait Training Performed gait training with patient using rolling walker. Education with patient regarding gait sequencing including advancing walker, unlocking R knee, utilizing momentum to create heel strike on the right, and to lock out right knee. Then shift weight onto RLE and step with LLE utilizing step-to pattern. Pt requires repeated cues for R hip extension to lock RLE after initial heel strike. He has three episodes where he does not lock knee properly prior to attempting to shift weight. CGA and wheelchair follow. Pt requires repeated cues for upright posture as well as control step length. He is able to ambulate 42', 20', and 20.' Pt demonstrates heavy BUE support on walker handles during gait. Fatigue monitored by therapist throughout.   Pt educated throughout session about proper posture and technique with exercises. Improved exercise technique, movement at target joints, use of target muscles after min to mod verbal, visual, tactile cues.   Pt is making excellent progress with therapy and continues to demonstrate excellent motivation. He is able to complete all of his seated exercises with therapy today and included some hooklying and sidelying exercises as well. He continues to require assistance with fitting RLE into prosthesis completely. He is demonstrating improved strength, sequencing, and safety with sit to stand transfers but continues to require cues for safe hand placement. Performed gait training with patient using rolling walker. Education with patient regarding gait sequencing including advancing walker, unlocking R knee, utilizing momentum to create heel strike on the right, and to lock out right knee. Then shift weight onto RLE and step with LLE utilizing step-to pattern. Pt requires repeated cues for R hip extension to lock RLE after initial heel  strike. He has three episodes where he does not lock knee properly prior to attempting to shift weight. CGA and wheelchair follow. Pt requires repeated cues for upright posture as well as control step length. He is able to ambulate 42', 20', and 20.' Pt demonstrates heavy BUE support on walker handles during gait. Fatigue monitored by therapist throughout. Pt encouraged to continue HEP. He will benefit from PT services to address deficits in strength, balance, and mobility in order to return to full function at home.                          PT Short Term Goals - 08/01/19 1015      PT SHORT TERM GOAL #1   Title  Pt to demonstrate ability to perfrom STS transfer from chair c RW minGuard assist and lock his RLE knee joint without assist.  Baseline  ModA from elevated surface at eval;   Now able to perform with supervision from chair +2 airexpads.   Time  4    Period  Weeks    Status  Partially Met    Target Date  06/25/19      PT SHORT TERM GOAL #2   Title  Pt to demonstrate 5 degrees hip flexion 10 degrees hip abdct ROM.    Baseline  lacks 5 degrees from neutral in both at eval; At visit 10, has 0 degrees flexion/extension; still lacking 5 degrees ABDCT neutral    Time  4    Period  Weeks    Status  Partially Met    Target Date  06/25/19        PT Long Term Goals - 08/01/19 1015      PT LONG TERM GOAL #1   Title  Pt to demonstrates 15 degrees Rt hip ABDCT, 10 degrees Rt hip extension P/ROM to facilitate prosthesis motor control.    Time  8    Period  Weeks    Status  On-going    Target Date  10/22/19      PT LONG TERM GOAL #2   Title  Pt to demonstrate 5/5 strength hip extension at 0 degrees flexion/extension    Baseline  has 5/5 in some ranges, but not in ranges specific to standing or gait.    Time  8    Period  Weeks    Status  On-going    Target Date  10/22/19      PT LONG TERM GOAL #3   Title  Pt able to perfrom 5x STS from chair with RW, arms ad  lib in <21sec    Baseline  At visit 10 requires chair +2 pads +RW    Time  8    Period  Weeks    Status  On-going    Target Date  10/22/19            Plan - 08/06/19 0818    Clinical Impression Statement  Pt is making excellent progress with therapy and continues to demonstrate excellent motivation. He is able to complete all of his seated exercises with therapy today and included some hooklying and sidelying exercises as well. He continues to require assistance with fitting RLE into prosthesis completely. He is demonstrating improved strength, sequencing, and safety with sit to stand transfers but continues to require cues for safe hand placement. Performed gait training with patient using rolling walker. Education with patient regarding gait sequencing including advancing walker, unlocking R knee, utilizing momentum to create heel strike on the right, and to lock out right knee. Then shift weight onto RLE and step with LLE utilizing step-to pattern. Pt requires repeated cues for R hip extension to lock RLE after initial heel strike. He has three episodes where he does not lock knee properly prior to attempting to shift weight. CGA and wheelchair follow. Pt requires repeated cues for upright posture as well as control step length. He is able to ambulate 42', 20', and 20.' Pt demonstrates heavy BUE support on walker handles during gait. Fatigue monitored by therapist throughout. Pt encouraged to continue HEP. He will benefit from PT services to address deficits in strength, balance, and mobility in order to return to full function at home.    Personal Factors and Comorbidities  Age;Fitness;Past/Current Experience;Education;Transportation    Examination-Activity Limitations  Transfers;Dressing;Squat;Stairs;Stand;Bed Mobility    Examination-Participation Restrictions  Meal Prep;Cleaning;Community Activity;Driving;Yard Work;Laundry;Shop  Stability/Clinical Decision Making  Stable/Uncomplicated     Rehab Potential  Good    PT Frequency  2x / week    PT Duration  12 weeks    PT Treatment/Interventions  ADLs/Self Care Home Management;Cryotherapy;Electrical Stimulation;DME Instruction;Gait training;Stair training;Functional mobility training;Therapeutic activities;Therapeutic exercise;Balance training;Neuromuscular re-education;Cognitive remediation;Patient/family education;Prosthetic Training;Wheelchair mobility training;Passive range of motion;Dry needling;Scar mobilization;Spinal Manipulations;Joint Manipulations    PT Next Visit Plan  Strength, balance, transfers, side stepping, consider forward stepping in // bars with close CGA and chair follow;    PT Home Exercise Plan  Medbridge Access Code: [email or web address] (prone positioning), L single leg bridges, L sidelying R hip abduction, hip isometric adduction    Consulted and Agree with Plan of Care  Patient       Patient will benefit from skilled therapeutic intervention in order to improve the following deficits and impairments:  Decreased activity tolerance, Decreased balance, Decreased mobility, Decreased strength, Decreased cognition, Decreased knowledge of use of DME, Decreased knowledge of precautions, Decreased endurance, Decreased range of motion, Difficulty walking, Decreased safety awareness, Decreased skin integrity, Decreased scar mobility, Hypomobility, Postural dysfunction, Increased edema, Increased muscle spasms, Increased fascial restricitons, Prosthetic Dependency, Impaired tone, Improper body mechanics  Visit Diagnosis: Difficulty in walking, not elsewhere classified  Unsteadiness on feet     Problem List Patient Active Problem List   Diagnosis Date Noted  . Cardiomyopathy ([place]) 01/22/2019  . CHF (congestive heart failure) ([place]) 01/22/2019  . Coronary disease 01/22/2019  . Above knee amputation of right lower extremity ([place]) 11/13/2018  . Leg ulcer, right, with fat layer exposed ([place]) 10/22/2018  . Cellulitis of right leg  07/21/2018  . Lower limb ulcer, calf, right, limited to breakdown of skin ([place]) 06/08/2018  . PVD (peripheral vascular disease) ([place]) 04/17/2018  . Malnutrition of moderate degree 04/11/2018  . Pressure injury of skin 04/06/2018  . Altered mental status 04/04/2018  . Hypothermia 04/04/2018  . Hemodialysis graft malfunction ([place]) 03/26/2018  . Hypercholesterolemia 02/27/2018  . Diabetes ([place]) 02/27/2018  . Weakness of right lower extremity 01/20/2018  . Fever   . Periumbilical abdominal pain   . Confusion 12/22/2017  . Acute delirium 12/21/2017  . Protein-calorie malnutrition, severe 12/19/2017  . Intractable nausea and vomiting 12/18/2017  . Lymphedema 12/13/2017  . Cellulitis 11/27/2017  . Chest pain 11/19/2017  . Atherosclerosis of native arteries of the extremities with ulceration ([place]) 11/07/2017  . Twitching 01/03/2017  . Elevated troponin 10/02/2015  . Complications, dialysis, catheter, mechanical ([place]) 10/02/2015  . Musculoskeletal chest pain 09/28/2015  . Chronic diastolic CHF (congestive heart failure) ([place]) 09/28/2015  . End stage renal disease ([place]) 10/09/2012  . Hypertension 10/09/2012  . Gout 10/09/2012   Phillips Grout PT, DPT, GCS  Elmyra Banwart 08/06/2019, 11:59 AM  [name] MAIN [facility] SERVICES [address] [place], [place], [address] Phone: [phone]   Fax:  [phone]  Name: Johnny [name]. MRN: [id number] Date of Birth: [birth year]

## 2019-08-08 ENCOUNTER — Ambulatory Visit: Payer: Medicare Other | Attending: Nurse Practitioner

## 2019-08-08 ENCOUNTER — Telehealth (INDEPENDENT_AMBULATORY_CARE_PROVIDER_SITE_OTHER): Payer: Self-pay

## 2019-08-08 ENCOUNTER — Other Ambulatory Visit: Payer: Self-pay

## 2019-08-08 ENCOUNTER — Encounter (INDEPENDENT_AMBULATORY_CARE_PROVIDER_SITE_OTHER): Payer: Self-pay | Admitting: Nurse Practitioner

## 2019-08-08 DIAGNOSIS — R262 Difficulty in walking, not elsewhere classified: Secondary | ICD-10-CM | POA: Insufficient documentation

## 2019-08-08 DIAGNOSIS — R2681 Unsteadiness on feet: Secondary | ICD-10-CM | POA: Diagnosis present

## 2019-08-08 IMAGING — DX DG CHEST 1V PORT
1 series · 1 of 1 positions shown · non-contrast
Comparison: January 20, 2018

CLINICAL DATA: Chest pain

EXAM:
PORTABLE CHEST 1 VIEW

[chest ap]
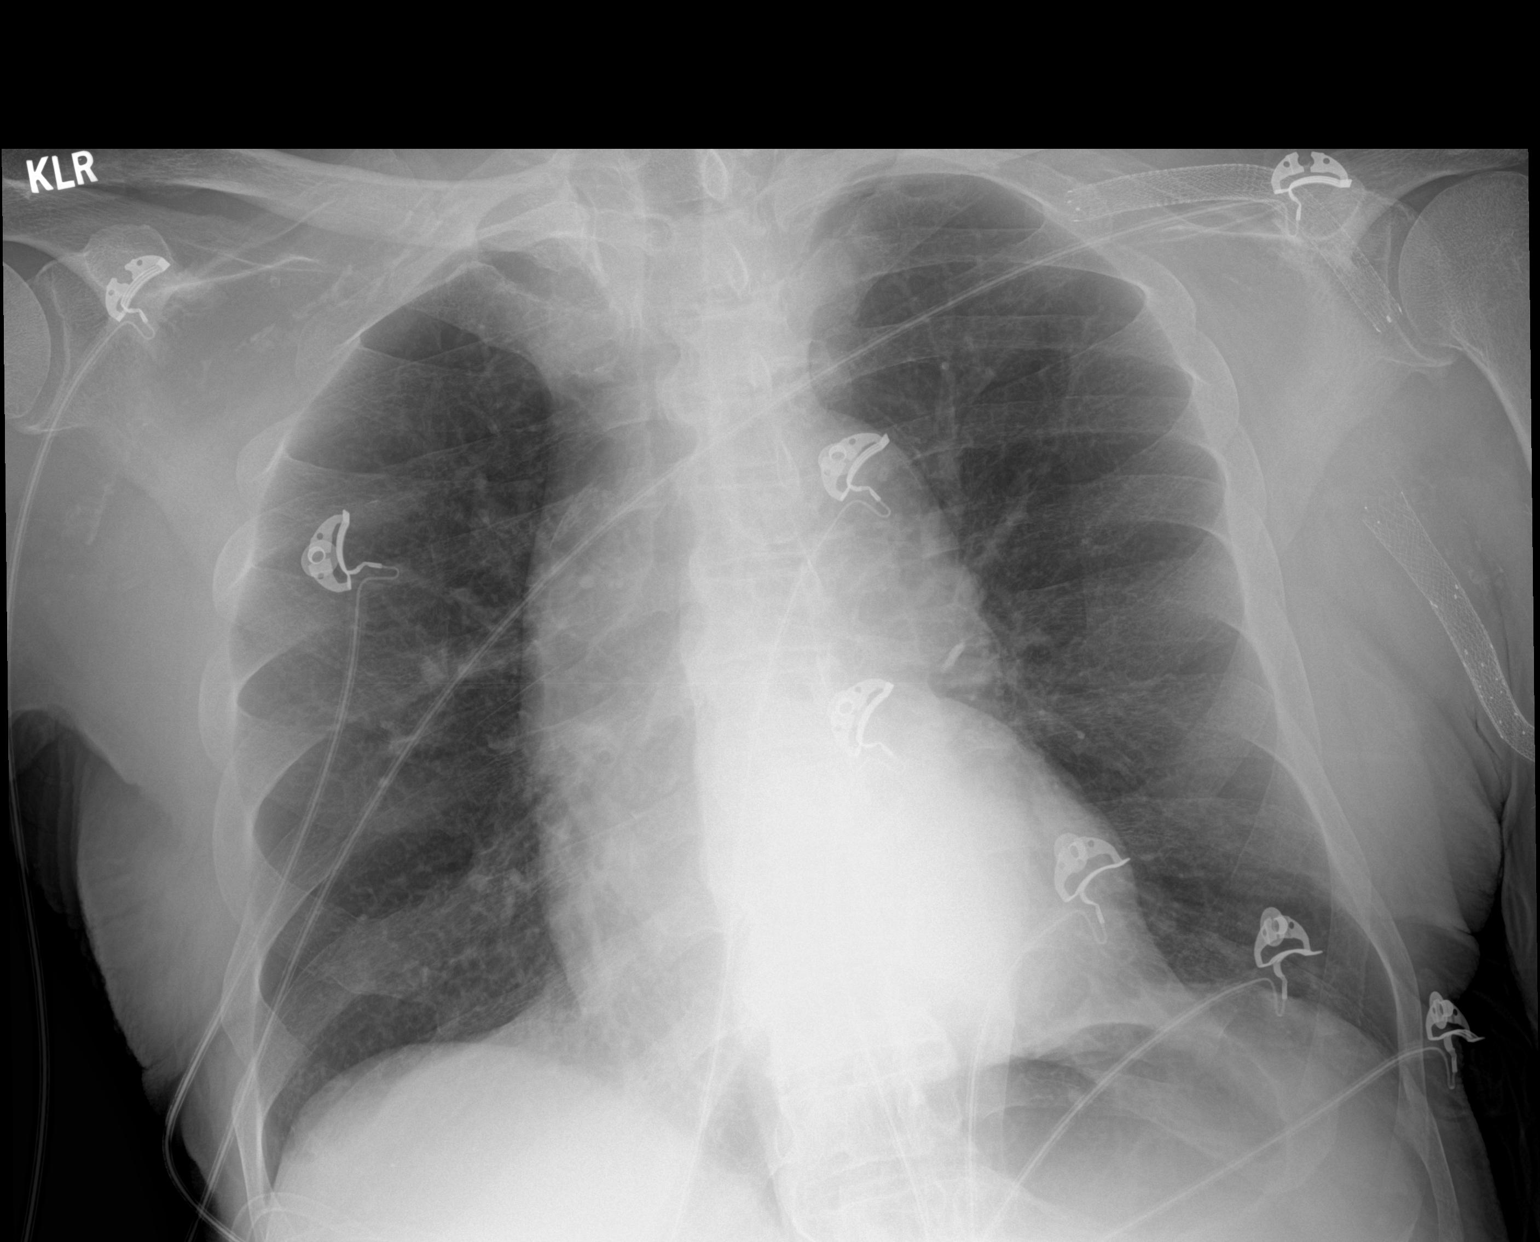

[1 of 1 positions shown; findings below may reference images not displayed]

FINDINGS: No edema or consolidation. The heart size and pulmonary vascularity
are normal. No adenopathy. No bone lesions. There are stents in the
left axillary and left brachial regions. There is right axillary and
subclavian artery calcification.
IMPRESSION: No edema or consolidation.  Stable cardiac silhouette.

## 2019-08-08 NOTE — Telephone Encounter (Signed)
Foye Spurling, Can you make print a note for the information noted above and I will sign it so we can fax it.  Thanks.

## 2019-08-08 NOTE — Therapy (Signed)
[place] MAIN [facility] SERVICES [address] [place], [place], [address] Phone: [phone]   Fax:  [phone]  Physical Therapy Treatment  Patient Details  Name: Johnny [name]. MRN: [id number] Date of Birth: Nov 07, 1950 Referring Provider (PT): Eulogio Ditch, NP    Encounter Date: 08/08/2019  PT End of Session - 08/08/19 0841    Visit Number  15    Number of Visits  41    Date for PT Re-Evaluation  10/22/19    Authorization Type  [employer] Medicare    PT Start Time  0845    PT Stop Time  0930    PT Time Calculation (min)  45 min    Equipment Utilized During Treatment  Gait belt    Activity Tolerance  Patient tolerated treatment well;No increased pain    Behavior During Therapy  WFL for tasks assessed/performed       Past Medical History:  Diagnosis Date  . Anemia   . Anxiety   . CHF (congestive heart failure) ([place])   . Chronic kidney disease    esrd dialysis m/w/f  . Gout   . Hyperlipidemia   . Hypertension   . Myocardial infarction ([place]) 2010   10 years ago  . 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  . Peripheral vascular disease [facility])     Past Surgical History:  Procedure Laterality Date  . A/V FISTULAGRAM Right 09/06/2018   Procedure: A/V FISTULAGRAM;  Surgeon: Algernon Huxley, MD;  Location: [place] CV LAB;  Service: Cardiovascular;  Laterality: Right;  . A/V SHUNTOGRAM Left 06/21/2017   Procedure: A/V SHUNTOGRAM;  Surgeon: Katha Cabal, MD;  Location: [place] CV LAB;  Service: Cardiovascular;  Laterality: Left;  . A/V SHUNTOGRAM N/A 10/24/2018   Procedure: A/V SHUNTOGRAM;  Surgeon: Algernon Huxley, MD;  Location: [place] CV LAB;  Service: Cardiovascular;  Laterality: N/A;  . ABOVE KNEE LEG AMPUTATION Right 2020  . AMPUTATION Right 10/25/2018   Procedure: AMPUTATION ABOVE KNEE;  Surgeon: Algernon Huxley, MD;  Location: [facility] ORS;  Service: General;  Laterality: Right;  . APPLICATION  OF WOUND VAC Right 04/11/2018   Procedure: APPLICATION OF WOUND VAC;  Surgeon: Algernon Huxley, MD;  Location: [facility] ORS;  Service: Vascular;  Laterality: Right;  . AV FISTULA PLACEMENT Left 09/18/2015   Procedure: INSERTION OF ARTERIOVENOUS (AV) GORE-TEX GRAFT ARM ( BRACH/AXILLARY GRAFT W/ INSTANT STICK GRAFT );  Surgeon: Katha Cabal, MD;  Location: [facility] ORS;  Service: Vascular;  Laterality: Left;  . AV FISTULA PLACEMENT Right 07/19/2018   Procedure: INSERTION OF ARTERIOVENOUS (AV) GORE-TEX GRAFT ARM ( BRACHIAL AXILLARY);  Surgeon: Algernon Huxley, MD;  Location: [facility] ORS;  Service: Vascular;  Laterality: Right;  . DIALYSIS FISTULA CREATION Right 10/2017   right chest perm cath  . DIALYSIS/PERMA CATHETER REMOVAL N/A 09/13/2018   Procedure: DIALYSIS/PERMA CATHETER REMOVAL;  Surgeon: Algernon Huxley, MD;  Location: [place] CV LAB;  Service: Cardiovascular;  Laterality: N/A;  . ESOPHAGOGASTRODUODENOSCOPY N/A 12/19/2017   Procedure: ESOPHAGOGASTRODUODENOSCOPY (EGD);  Surgeon: Lin Landsman, MD;  Location: [facility] ENDOSCOPY;  Service: Gastroenterology;  Laterality: N/A;  . LOWER EXTREMITY ANGIOGRAPHY Left 11/16/2017   Procedure: LOWER EXTREMITY ANGIOGRAPHY;  Surgeon: Algernon Huxley, MD;  Location: [place] CV LAB;  Service: Cardiovascular;  Laterality: Left;  . LOWER EXTREMITY ANGIOGRAPHY Right 01/18/2018   Procedure: LOWER EXTREMITY ANGIOGRAPHY;  Surgeon: Algernon Huxley, MD;  Location: [place] INVASIVE CV  LAB;  Service: Cardiovascular;  Laterality: Right;  . LOWER EXTREMITY ANGIOGRAPHY Left 04/02/2018   Procedure: LOWER EXTREMITY ANGIOGRAPHY;  Surgeon: Algernon Huxley, MD;  Location: [place] CV LAB;  Service: Cardiovascular;  Laterality: Left;  . LOWER EXTREMITY ANGIOGRAPHY Right 04/09/2018   Procedure: Lower Extremity Angiography with possible intervention;  Surgeon: Algernon Huxley, MD;  Location: [place] CV LAB;  Service: Cardiovascular;  Laterality: Right;  . LOWER EXTREMITY ANGIOGRAPHY Right  07/23/2018   Procedure: Lower Extremity Angiography;  Surgeon: Algernon Huxley, MD;  Location: [place] CV LAB;  Service: Cardiovascular;  Laterality: Right;  . LOWER EXTREMITY ANGIOGRAPHY Right 09/13/2018   Procedure: LOWER EXTREMITY ANGIOGRAPHY;  Surgeon: Algernon Huxley, MD;  Location: [place] CV LAB;  Service: Cardiovascular;  Laterality: Right;  . LOWER EXTREMITY VENOGRAPHY Right 09/13/2018   Procedure: LOWER EXTREMITY VENOGRAPHY;  Surgeon: Algernon Huxley, MD;  Location: [place] CV LAB;  Service: Cardiovascular;  Laterality: Right;  . PERIPHERAL VASCULAR CATHETERIZATION Left 09/01/2015   Procedure: A/V Shuntogram/Fistulagram;  Surgeon: Katha Cabal, MD;  Location: [place] CV LAB;  Service: Cardiovascular;  Laterality: Left;  . PERIPHERAL VASCULAR CATHETERIZATION N/A 09/30/2015   Procedure: A/V Shuntogram/Fistulagram with perm cathether removal;  Surgeon: Algernon Huxley, MD;  Location: [place] CV LAB;  Service: Cardiovascular;  Laterality: N/A;  . PERIPHERAL VASCULAR CATHETERIZATION Left 09/30/2015   Procedure: A/V Shunt Intervention;  Surgeon: Algernon Huxley, MD;  Location: [place] CV LAB;  Service: Cardiovascular;  Laterality: Left;  . PERIPHERAL VASCULAR CATHETERIZATION Left 12/03/2015   Procedure: Thrombectomy;  Surgeon: Algernon Huxley, MD;  Location: [place] CV LAB;  Service: Cardiovascular;  Laterality: Left;  . PERIPHERAL VASCULAR CATHETERIZATION Left 01/28/2016   Procedure: Thrombectomy;  Surgeon: Algernon Huxley, MD;  Location: [place] CV LAB;  Service: Cardiovascular;  Laterality: Left;  . PERIPHERAL VASCULAR CATHETERIZATION N/A 01/28/2016   Procedure: A/V Shuntogram/Fistulagram;  Surgeon: Algernon Huxley, MD;  Location: [place] CV LAB;  Service: Cardiovascular;  Laterality: N/A;  . SKIN SPLIT GRAFT Right 05/24/2018   Procedure: SKIN GRAFT SPLIT THICKNESS ( RIGHT CALF);  Surgeon: Algernon Huxley, MD;  Location: [facility] ORS;  Service: Vascular;  Laterality: Right;  .  UPPER EXTREMITY ANGIOGRAPHY  10/24/2018   Procedure: Upper Extremity Angiography;  Surgeon: Algernon Huxley, MD;  Location: [place] CV LAB;  Service: Cardiovascular;;  . UPPER EXTREMITY ANGIOGRAPHY Right 02/14/2019   Procedure: UPPER EXTREMITY ANGIOGRAPHY;  Surgeon: Algernon Huxley, MD;  Location: [place] CV LAB;  Service: Cardiovascular;  Laterality: Right;  . WOUND DEBRIDEMENT Right 04/11/2018   Procedure: DEBRIDEMENT WOUND calf muscle and skin;  Surgeon: Algernon Huxley, MD;  Location: [facility] ORS;  Service: Vascular;  Laterality: Right;    There were no vitals filed for this visit.  Subjective Assessment - 08/08/19 0841    Subjective  Pt doing ok today. No pain upon arrival today. Moderate soreness the day following our last therapy session but resolved by today. He reports compliance with HEP. No falls since last therapy session. He has continued walking some at home. He would like to know about driving issues related to using a prosthesis.    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 Pt DC hospital to SNF for rehab. He has since been back at home. Pt uses a manual WC or power chair for daily mobility. Pt takes tranportation services to HD MWF. Pt  was recently fitted for his AKA prosthesis in Jan 2021.    How long can you sit comfortably?  No difficulty    How long can you stand comfortably?  1 minute    Currently in Pain?  No/denies           TREATMENT   Ther-ex Initial sit to stand from elevated mat table in order for therapist to assist in seated RLE farther down into socket Seated marches 2x 15 bilateral; Seated clams with manual resistance2 x 15bilateral; Seated adductor squeeze with manual resistance2 x 15 bilateral; Seated L LAQ with manual resistance 2 x 15; Seated L HS curls with green tband 2 x 15; Education about driving modifications required with prosthesis.   Gait Training Performed gait  training with patientusing rolling walker.Education with patient regarding gait sequencing including advancing walker,unlocking R knee, utilizing momentum to create heel strike on the right, and to lock out right knee. Then shift weight onto RLE and step with LLE utilizing step-to pattern. Pt requires repeated cues for R hip extension to lock RLE after initial heel strike. He does not have any episodes where he doesn't properly lock his knee and no buckling today. CGAand wheelchair follow. Pt requires cues for upright posture as well as control step length/walker distance from body. He is able to ambulate 40 x 2 today. He continues to demonstrate heavy BUE support on walker handles during gait. Fatigue monitored by therapist throughout.   Pt educated throughout session about proper posture and technique with exercises. Improved exercise technique, movement at target joints, use of target muscles after min to mod verbal, visual, tactile cues.   Ptis making excellent progress with therapy and continues to demonstrate excellent motivation. He is able to complete all of his seated exercises with therapy today and included some hooklying and sidelying exercises as well. Hecontinues to require assistance withfittingRLE into prosthesis completely. He is demonstrating improved strength, sequencing, and safety with sit to stand transfers but continues to require cues for safe hand placement.Performed gait training with patientusing rolling walker.Education with patient regarding gait sequencing including advancing walker,unlocking R knee, utilizing momentum to create heel strike on the right, and to lock out right knee. Then shift weight onto RLE and step with LLE utilizing step-to pattern. Pt requires repeated cues for R hip extension to lock RLE after initial heel strike. He does not have any episodes where he doesn't properly lock his knee and no buckling today. CGAand wheelchair follow. Pt  requires cues for upright posture as well as control step length/walker distance from body. He is able to ambulate 40 x 2 today. He continues to demonstrate heavy BUE support on walker handles during gait. Fatigue monitored by therapist throughout. Pt advised that he can try some ambulation at home with CGA by friends/family with wheelchair follow. Pt encouraged to continue HEP. Hewill benefit from PT services to address deficits in strength, balance, and mobility in order to return to full function at home.                        PT Short Term Goals - 08/01/19 1015      PT SHORT TERM GOAL #1   Title  Pt to demonstrate ability to perfrom STS transfer from chair c RW minGuard assist and lock his RLE knee joint without assist.    Baseline  ModA from elevated surface at eval;   Now able to perform with supervision from chair +2 airexpads.  Time  4    Period  Weeks    Status  Partially Met    Target Date  06/25/19      PT SHORT TERM GOAL #2   Title  Pt to demonstrate 5 degrees hip flexion 10 degrees hip abdct ROM.    Baseline  lacks 5 degrees from neutral in both at eval; At visit 10, has 0 degrees flexion/extension; still lacking 5 degrees ABDCT neutral    Time  4    Period  Weeks    Status  Partially Met    Target Date  06/25/19        PT Long Term Goals - 08/01/19 1015      PT LONG TERM GOAL #1   Title  Pt to demonstrates 15 degrees Rt hip ABDCT, 10 degrees Rt hip extension P/ROM to facilitate prosthesis motor control.    Time  8    Period  Weeks    Status  On-going    Target Date  10/22/19      PT LONG TERM GOAL #2   Title  Pt to demonstrate 5/5 strength hip extension at 0 degrees flexion/extension    Baseline  has 5/5 in some ranges, but not in ranges specific to standing or gait.    Time  8    Period  Weeks    Status  On-going    Target Date  10/22/19      PT LONG TERM GOAL #3   Title  Pt able to perfrom 5x STS from chair with RW, arms ad lib in  <21sec    Baseline  At visit 10 requires chair +2 pads +RW    Time  8    Period  Weeks    Status  On-going    Target Date  10/22/19            Plan - 08/08/19 0841    Clinical Impression Statement  Pt is making excellent progress with therapy and continues to demonstrate excellent motivation. He is able to complete all of his seated exercises with therapy today and included some hooklying and sidelying exercises as well. He continues to require assistance with fitting RLE into prosthesis completely. He is demonstrating improved strength, sequencing, and safety with sit to stand transfers but continues to require cues for safe hand placement. Performed gait training with patient using rolling walker. Education with patient regarding gait sequencing including advancing walker, unlocking R knee, utilizing momentum to create heel strike on the right, and to lock out right knee. Then shift weight onto RLE and step with LLE utilizing step-to pattern. Pt requires repeated cues for R hip extension to lock RLE after initial heel strike. He does not have any episodes where he doesn't properly lock his knee and no buckling today. CGA and wheelchair follow. Pt requires cues for upright posture as well as control step length/walker distance from body. He is able to ambulate 40 x 2 today. He continues to demonstrate heavy BUE support on walker handles during gait. Fatigue monitored by therapist throughout. Pt advised that he can try some ambulation at home with CGA by friends/family with wheelchair follow. Pt encouraged to continue HEP. He will benefit from PT services to address deficits in strength, balance, and mobility in order to return to full function at home.    Personal Factors and Comorbidities  Age;Fitness;Past/Current Experience;Education;Transportation    Examination-Activity Limitations  Transfers;Dressing;Squat;Stairs;Stand;Bed Mobility    Examination-Participation Restrictions  Meal  Prep;Cleaning;Community Activity;Driving;Yard Work;Laundry;Shop  Stability/Clinical Decision Making  Stable/Uncomplicated    Rehab Potential  Good    PT Frequency  2x / week    PT Duration  12 weeks    PT Treatment/Interventions  ADLs/Self Care Home Management;Cryotherapy;Electrical Stimulation;DME Instruction;Gait training;Stair training;Functional mobility training;Therapeutic activities;Therapeutic exercise;Balance training;Neuromuscular re-education;Cognitive remediation;Patient/family education;Prosthetic Training;Wheelchair mobility training;Passive range of motion;Dry needling;Scar mobilization;Spinal Manipulations;Joint Manipulations    PT Next Visit Plan  Strength, balance, transfers, side stepping, consider forward stepping in // bars with close CGA and chair follow;    PT Home Exercise Plan  Medbridge Access Code: [email or web address] (prone positioning), L single leg bridges, L sidelying R hip abduction, hip isometric adduction    Consulted and Agree with Plan of Care  Patient       Patient will benefit from skilled therapeutic intervention in order to improve the following deficits and impairments:  Decreased activity tolerance, Decreased balance, Decreased mobility, Decreased strength, Decreased cognition, Decreased knowledge of use of DME, Decreased knowledge of precautions, Decreased endurance, Decreased range of motion, Difficulty walking, Decreased safety awareness, Decreased skin integrity, Decreased scar mobility, Hypomobility, Postural dysfunction, Increased edema, Increased muscle spasms, Increased fascial restricitons, Prosthetic Dependency, Impaired tone, Improper body mechanics  Visit Diagnosis: Difficulty in walking, not elsewhere classified  Unsteadiness on feet     Problem List Patient Active Problem List   Diagnosis Date Noted  . Cardiomyopathy ([place]) 01/22/2019  . CHF (congestive heart failure) ([place]) 01/22/2019  . Coronary disease 01/22/2019  . Above knee amputation of  right lower extremity ([place]) 11/13/2018  . Leg ulcer, right, with fat layer exposed ([place]) 10/22/2018  . Cellulitis of right leg 07/21/2018  . Lower limb ulcer, calf, right, limited to breakdown of skin ([place]) 06/08/2018  . PVD (peripheral vascular disease) ([place]) 04/17/2018  . Malnutrition of moderate degree 04/11/2018  . Pressure injury of skin 04/06/2018  . Altered mental status 04/04/2018  . Hypothermia 04/04/2018  . Hemodialysis graft malfunction ([place]) 03/26/2018  . Hypercholesterolemia 02/27/2018  . Diabetes ([place]) 02/27/2018  . Weakness of right lower extremity 01/20/2018  . Fever   . Periumbilical abdominal pain   . Confusion 12/22/2017  . Acute delirium 12/21/2017  . Protein-calorie malnutrition, severe 12/19/2017  . Intractable nausea and vomiting 12/18/2017  . Lymphedema 12/13/2017  . Cellulitis 11/27/2017  . Chest pain 11/19/2017  . Atherosclerosis of native arteries of the extremities with ulceration ([place]) 11/07/2017  . Twitching 01/03/2017  . Elevated troponin 10/02/2015  . Complications, dialysis, catheter, mechanical ([place]) 10/02/2015  . Musculoskeletal chest pain 09/28/2015  . Chronic diastolic CHF (congestive heart failure) ([place]) 09/28/2015  . End stage renal disease ([place]) 10/09/2012  . Hypertension 10/09/2012  . Gout 10/09/2012   Johnny [name] PT, DPT, GCS  Johnny [name] 08/08/2019, 11:01 AM  [place] MAIN [facility] SERVICES [address] [place], [place], [address] Phone: [phone]   Fax:  [phone]  Name: Nashaun Hillmer. MRN: [id number] Date of Birth: 09-17-1950

## 2019-08-08 NOTE — Telephone Encounter (Signed)
Johnny [name] called asking if we could fax over a note to [facility] were the pt resides saying that he needs to have a residence on the 1st floor instead of his current residence on the 3rd due to the elevator being broken an the pt having to walk down five flights of stairs. Johnny [name] also mentioned  That his DSS case worker saw this as a concern.

## 2019-08-13 ENCOUNTER — Other Ambulatory Visit: Payer: Self-pay

## 2019-08-13 ENCOUNTER — Ambulatory Visit: Payer: Medicare Other

## 2019-08-13 DIAGNOSIS — R2681 Unsteadiness on feet: Secondary | ICD-10-CM

## 2019-08-13 DIAGNOSIS — R262 Difficulty in walking, not elsewhere classified: Secondary | ICD-10-CM

## 2019-08-13 NOTE — Therapy (Signed)
[place] MAIN [facility] SERVICES [address] [place], [place], [address] Phone: [phone]   Fax:  [phone]  Physical Therapy Treatment  Patient Details  Name: Johnny [name]. MRN: [id number] Date of Birth: June 01, 1950 Referring Provider (PT): Eulogio Ditch, NP    Encounter Date: 08/13/2019  PT End of Session - 08/13/19 0926    Visit Number  16    Number of Visits  41    Date for PT Re-Evaluation  10/22/19    Authorization Type  [employer] Medicare    PT Start Time  0929    PT Stop Time  1015    PT Time Calculation (min)  46 min    Equipment Utilized During Treatment  Gait belt    Activity Tolerance  Patient tolerated treatment well;No increased pain    Behavior During Therapy  WFL for tasks assessed/performed       Past Medical History:  Diagnosis Date  . Anemia   . Anxiety   . CHF (congestive heart failure) ([place])   . Chronic kidney disease    esrd dialysis m/w/f  . Gout   . Hyperlipidemia   . Hypertension   . Myocardial infarction ([place]) 2010   10 years ago  . 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  . Peripheral vascular disease [facility])     Past Surgical History:  Procedure Laterality Date  . A/V FISTULAGRAM Right 09/06/2018   Procedure: A/V FISTULAGRAM;  Surgeon: Algernon Huxley, MD;  Location: [place] CV LAB;  Service: Cardiovascular;  Laterality: Right;  . A/V SHUNTOGRAM Left 06/21/2017   Procedure: A/V SHUNTOGRAM;  Surgeon: Katha Cabal, MD;  Location: [place] [facility];  Service: Cardiovascular;  Laterality: Left;  . A/V SHUNTOGRAM N/A 10/24/2018   Procedure: A/V SHUNTOGRAM;  Surgeon: Algernon Huxley, MD;  Location: [place] CV LAB;  Service: Cardiovascular;  Laterality: N/A;  . ABOVE KNEE LEG AMPUTATION Right 2020  . AMPUTATION Right 10/25/2018   Procedure: AMPUTATION ABOVE KNEE;  Surgeon: Algernon Huxley, MD;  Location: [facility] ORS;  Service: General;  Laterality: Right;  . APPLICATION  OF WOUND VAC Right 04/11/2018   Procedure: APPLICATION OF WOUND VAC;  Surgeon: Algernon Huxley, MD;  Location: [facility] ORS;  Service: Vascular;  Laterality: Right;  . AV FISTULA PLACEMENT Left 09/18/2015   Procedure: INSERTION OF ARTERIOVENOUS (AV) GORE-TEX GRAFT ARM ( BRACH/AXILLARY GRAFT W/ INSTANT STICK GRAFT );  Surgeon: Katha Cabal, MD;  Location: [facility] ORS;  Service: Vascular;  Laterality: Left;  . AV FISTULA PLACEMENT Right 07/19/2018   Procedure: INSERTION OF ARTERIOVENOUS (AV) GORE-TEX GRAFT ARM ( BRACHIAL AXILLARY);  Surgeon: Algernon Huxley, MD;  Location: [facility] ORS;  Service: Vascular;  Laterality: Right;  . DIALYSIS FISTULA CREATION Right 10/2017   right chest perm cath  . DIALYSIS/PERMA CATHETER REMOVAL N/A 09/13/2018   Procedure: DIALYSIS/PERMA CATHETER REMOVAL;  Surgeon: Algernon Huxley, MD;  Location: [place] [facility];  Service: Cardiovascular;  Laterality: N/A;  . ESOPHAGOGASTRODUODENOSCOPY N/A 12/19/2017   Procedure: ESOPHAGOGASTRODUODENOSCOPY (EGD);  Surgeon: Lin Landsman, MD;  Location: [facility]COPY;  Service: Gastroenterology;  Laterality: N/A;  . LOWER EXTREMITY ANGIOGRAPHY Left 11/16/2017   Procedure: LOWER EXTREMITY ANGIOGRAPHY;  Surgeon: Algernon Huxley, MD;  Location: [place] [facility];  Service: Cardiovascular;  Laterality: Left;  . LOWER EXTREMITY ANGIOGRAPHY Right 01/18/2018   Procedure: LOWER EXTREMITY ANGIOGRAPHY;  Surgeon: Algernon Huxley, MD;  Location: [place] INVASIVE CV  LAB;  Service: Cardiovascular;  Laterality: Right;  . LOWER EXTREMITY ANGIOGRAPHY Left 04/02/2018   Procedure: LOWER EXTREMITY ANGIOGRAPHY;  Surgeon: Algernon Huxley, MD;  Location: [place] CV LAB;  Service: Cardiovascular;  Laterality: Left;  . LOWER EXTREMITY ANGIOGRAPHY Right 04/09/2018   Procedure: Lower Extremity Angiography with possible intervention;  Surgeon: Algernon Huxley, MD;  Location: [place] CV LAB;  Service: Cardiovascular;  Laterality: Right;  . LOWER EXTREMITY ANGIOGRAPHY Right  07/23/2018   Procedure: Lower Extremity Angiography;  Surgeon: Algernon Huxley, MD;  Location: [place] [facility];  Service: Cardiovascular;  Laterality: Right;  . LOWER EXTREMITY ANGIOGRAPHY Right 09/13/2018   Procedure: LOWER EXTREMITY ANGIOGRAPHY;  Surgeon: Algernon Huxley, MD;  Location: [place] [facility] LAB;  Service: Cardiovascular;  Laterality: Right;  . LOWER EXTREMITY VENOGRAPHY Right 09/13/2018   Procedure: LOWER EXTREMITY VENOGRAPHY;  Surgeon: Algernon Huxley, MD;  Location: [place] [facility];  Service: Cardiovascular;  Laterality: Right;  . PERIPHERAL VASCULAR CATHETERIZATION Left 09/01/2015   Procedure: A/V Shuntogram/Fistulagram;  Surgeon: Katha Cabal, MD;  Location: [place] [facility] LAB;  Service: Cardiovascular;  Laterality: Left;  . PERIPHERAL VASCULAR CATHETERIZATION N/A 09/30/2015   Procedure: A/V Shuntogram/Fistulagram with perm cathether removal;  Surgeon: Algernon Huxley, MD;  Location: [place] [facility] LAB;  Service: Cardiovascular;  Laterality: N/A;  . PERIPHERAL VASCULAR CATHETERIZATION Left 09/30/2015   Procedure: A/V Shunt Intervention;  Surgeon: Algernon Huxley, MD;  Location: [place] [facility] LAB;  Service: Cardiovascular;  Laterality: Left;  . PERIPHERAL VASCULAR CATHETERIZATION Left 12/03/2015   Procedure: Thrombectomy;  Surgeon: Algernon Huxley, MD;  Location: [place] CV LAB;  Service: Cardiovascular;  Laterality: Left;  . PERIPHERAL VASCULAR CATHETERIZATION Left 01/28/2016   Procedure: Thrombectomy;  Surgeon: Algernon Huxley, MD;  Location: [place] CV LAB;  Service: Cardiovascular;  Laterality: Left;  . PERIPHERAL VASCULAR CATHETERIZATION N/A 01/28/2016   Procedure: A/V Shuntogram/Fistulagram;  Surgeon: Algernon Huxley, MD;  Location: [place] [facility];  Service: Cardiovascular;  Laterality: N/A;  . SKIN SPLIT GRAFT Right 05/24/2018   Procedure: SKIN GRAFT SPLIT THICKNESS ( RIGHT CALF);  Surgeon: Algernon Huxley, MD;  Location: [facility] OR[facility];  Service: Vascular;  Laterality: Right;  .  UPPER EXTREMITY ANGIOGRAPHY  10/24/2018   Procedure: Upper Extremity Angiography;  Surgeon: Algernon Huxley, MD;  Location: [place] [facility] LAB;  Service: Cardiovascular;;  . UPPER EXTREMITY ANGIOGRAPHY Right 02/14/2019   Procedure: UPPER EXTREMITY ANGIOGRAPHY;  Surgeon: Algernon Huxley, MD;  Location: [place] [facility] LAB;  Service: Cardiovascular;  Laterality: Right;  . WOUND DEBRIDEMENT Right 04/11/2018   Procedure: DEBRIDEMENT WOUND calf muscle and skin;  Surgeon: Algernon Huxley, MD;  Location: [facility] OR[facility];  Service: Vascular;  Laterality: Right;    There were no vitals filed for this visit.  Subjective Assessment - 08/13/19 1315    Subjective  Patient reports he has been practicing his exercises at home. No falls or LOB since last session. Had a good easter and was able to be with family    Pertinent History  Pt underwent Rt AKA amputation in June 2020 after difficulty with wound healing/infection. Pt DC hospital to SNF for rehab. He has since been back at home. Pt uses a manual WC or power chair for daily mobility. Pt takes tranportation services to HD MWF. Pt was recently fitted for his AKA prosthesis in Jan 2021.    How long can you sit comfortably?  No difficulty  How long can you stand comfortably?  1 minute    Currently in Pain?  No/denies              TREATMENT     Ther-ex  Initial sit to stand from elevated mat table in order for therapist to assist in seated RLE farther down into socket Seated marches 2 x 15 bilateral;second set with RTB resistance around knees.  Seated clams with manual resistance  x 15 bilateral; GTB around knees, one LE at a time 12x , very challenging for stabilizing opp LE.  Seated adductor squeeze with manual resistance  2 x 15 bilateral; GTB around residual limb; resisted hip extension/march RLE 15x 2 sets bosu ball leg press sitting, 15x each LE, focus on LE activation rather than trunk motion.   Sit to stand with green disc under LLE for weight shift,  Min A to R prosthesis for sequencing x12 at varying height of plinth table.   Stand with RW: weight shift onto/off of prosthetic limb 15x   Standing with RW, marching for weight shift, locking/unlocking of prosthetic limb 15x.   Education and performance of stand pivot transfer with focus on walker placement, foot movement/sequencing, and safety awareness.    Gait Training Performed gait training with patient using rolling walker. Education with patient regarding gait sequencing including advancing walker, unlocking R knee, utilizing momentum to create heel strike on the right, and to lock out right knee. Then shift weight onto RLE and step with LLE utilizing step-to pattern. Pt requires repeated cues for R hip extension to lock RLE after initial heel strike. He does not have any episodes where he doesn't properly lock his knee and no buckling today. CGA and wheelchair follow. Pt requires cues for upright posture as well as control step length/walker distance from body. He is able to ambulate 50 today. He continues to demonstrate heavy BUE support on walker handles during gait. Fatigue monitored by therapist throughout.     Pt educated throughout session about proper posture and technique with exercises. Improved exercise technique, movement at target joints, use of target muscles after min to mod verbal, visual, tactile cues.                  PT Education - 08/13/19 0926    Education Details  exercise technique, body mechanics    Person(s) Educated  Patient    Methods  Explanation;Demonstration;Tactile cues;Verbal cues    Comprehension  Verbalized understanding;Returned demonstration;Verbal cues required;Tactile cues required       PT Short Term Goals - 08/01/19 1015      PT SHORT TERM GOAL #1   Title  Pt to demonstrate ability to perfrom STS transfer from chair c RW minGuard assist and lock his RLE knee joint without assist.    Baseline  ModA from elevated surface at eval;    Now able to perform with supervision from chair +2 airexpads.   Time  4    Period  Weeks    Status  Partially Met    Target Date  06/25/19      PT SHORT TERM GOAL #2   Title  Pt to demonstrate 5 degrees hip flexion 10 degrees hip abdct ROM.    Baseline  lacks 5 degrees from neutral in both at eval; At visit 10, has 0 degrees flexion/extension; still lacking 5 degrees ABDCT neutral    Time  4    Period  Weeks    Status  Partially Met  Target Date  06/25/19        PT Long Term Goals - 08/01/19 1015      PT LONG TERM GOAL #1   Title  Pt to demonstrates 15 degrees Rt hip ABDCT, 10 degrees Rt hip extension P/ROM to facilitate prosthesis motor control.    Time  8    Period  Weeks    Status  On-going    Target Date  10/22/19      PT LONG TERM GOAL #2   Title  Pt to demonstrate 5/5 strength hip extension at 0 degrees flexion/extension    Baseline  has 5/5 in some ranges, but not in ranges specific to standing or gait.    Time  8    Period  Weeks    Status  On-going    Target Date  10/22/19      PT LONG TERM GOAL #3   Title  Pt able to perfrom 5x STS from chair with RW, arms ad lib in <21sec    Baseline  At visit 10 requires chair +2 pads +RW    Time  8    Period  Weeks    Status  On-going    Target Date  10/22/19            Plan - 08/13/19 1322    Clinical Impression Statement  Patient is challenged by weight acceptance onto prosthetic limb with transfers and mobility. He requires heavy cueing for weight acceptance once limb is locked due to fear of support. Patient is limited in posterior limb activation of residual limb with compensatory trunk mechanisms with fatigue. Education on gait sequence for safety, body mechanics, and posture required with noticeable fatigue of residual limb resulting in compensatory gait mechanisms. He will benefit from PT services to address deficits in strength, balance, and mobility in order to return to full function at home.    Personal  Factors and Comorbidities  Age;Fitness;Past/Current Experience;Education;Transportation    Examination-Activity Limitations  Transfers;Dressing;Squat;Stairs;Stand;Bed Mobility    Examination-Participation Restrictions  Meal Prep;Cleaning;Community Activity;Driving;Yard Work;Laundry;Shop    Stability/Clinical Decision Making  Stable/Uncomplicated    Rehab Potential  Good    PT Frequency  2x / week    PT Duration  12 weeks    PT Treatment/Interventions  ADLs/Self Care Home Management;Cryotherapy;Electrical Stimulation;DME Instruction;Gait training;Stair training;Functional mobility training;Therapeutic activities;Therapeutic exercise;Balance training;Neuromuscular re-education;Cognitive remediation;Patient/family education;Prosthetic Training;Wheelchair mobility training;Passive range of motion;Dry needling;Scar mobilization;Spinal Manipulations;Joint Manipulations    PT Next Visit Plan  Strength, balance, transfers, side stepping, consider forward stepping in // bars with close CGA and chair follow;    PT Home Exercise Plan  Medbridge Access Code: [email or web address] (prone positioning), L single leg bridges, L sidelying R hip abduction, hip isometric adduction    Consulted and Agree with Plan of Care  Patient       Patient will benefit from skilled therapeutic intervention in order to improve the following deficits and impairments:  Decreased activity tolerance, Decreased balance, Decreased mobility, Decreased strength, Decreased cognition, Decreased knowledge of use of DME, Decreased knowledge of precautions, Decreased endurance, Decreased range of motion, Difficulty walking, Decreased safety awareness, Decreased skin integrity, Decreased scar mobility, Hypomobility, Postural dysfunction, Increased edema, Increased muscle spasms, Increased fascial restricitons, Prosthetic Dependency, Impaired tone, Improper body mechanics  Visit Diagnosis: Difficulty in walking, not elsewhere classified  Unsteadiness on  feet     Problem List Patient Active Problem List   Diagnosis Date Noted  . Cardiomyopathy ([place]) 01/22/2019  . CHF (congestive heart failure) ([place])  01/22/2019  . Coronary disease 01/22/2019  . Above knee amputation of right lower extremity ([place]) 11/13/2018  . Leg ulcer, right, with fat layer exposed ([place]) 10/22/2018  . Cellulitis of right leg 07/21/2018  . Lower limb ulcer, calf, right, limited to breakdown of skin ([place]) 06/08/2018  . PVD (peripheral vascular disease) ([place]) 04/17/2018  . Malnutrition of moderate degree 04/11/2018  . Pressure injury of skin 04/06/2018  . Altered mental status 04/04/2018  . Hypothermia 04/04/2018  . Hemodialysis graft malfunction ([place]) 03/26/2018  . Hypercholesterolemia 02/27/2018  . Diabetes ([place]) 02/27/2018  . Weakness of right lower extremity 01/20/2018  . Fever   . Periumbilical abdominal pain   . Confusion 12/22/2017  . Acute delirium 12/21/2017  . Protein-calorie malnutrition, severe 12/19/2017  . Intractable nausea and vomiting 12/18/2017  . Lymphedema 12/13/2017  . Cellulitis 11/27/2017  . Chest pain 11/19/2017  . Atherosclerosis of native arteries of the extremities with ulceration ([place]) 11/07/2017  . Twitching 01/03/2017  . Elevated troponin 10/02/2015  . Complications, dialysis, catheter, mechanical ([place]) 10/02/2015  . Musculoskeletal chest pain 09/28/2015  . Chronic diastolic CHF (congestive heart failure) ([place]) 09/28/2015  . End stage renal disease ([place]) 10/09/2012  . Hypertension 10/09/2012  . Gout 10/09/2012   Janna Arch, PT, DPT   08/13/2019, 1:24 PM  [place] MAIN [facility] SERVICES [address] [place], [place], [address] Phone: [phone]   Fax:  [phone]  Name: Johnny [name]. MRN: [id number] Date of Birth: December 26, 1950

## 2019-08-15 ENCOUNTER — Other Ambulatory Visit: Payer: Self-pay

## 2019-08-15 ENCOUNTER — Ambulatory Visit: Payer: Medicare Other

## 2019-08-15 DIAGNOSIS — R262 Difficulty in walking, not elsewhere classified: Secondary | ICD-10-CM | POA: Diagnosis not present

## 2019-08-15 DIAGNOSIS — R2681 Unsteadiness on feet: Secondary | ICD-10-CM

## 2019-08-15 NOTE — Therapy (Signed)
[place] MAIN [facility] SERVICES [address] [place], [place], [address] Phone: [phone]   Fax:  [phone]  Physical Therapy Treatment  Patient Details  Name: Johnny [name]. MRN: [id number] Date of Birth: [birth year] Referring Provider (PT): Eulogio Ditch, NP    Encounter Date: 08/15/2019  PT End of Session - 08/15/19 0923    Visit Number  17    Number of Visits  41    Date for PT Re-Evaluation  10/22/19    Authorization Type  [employer] Medicare    Authorization Time Period  05/28/19-07/23/19    PT Start Time  0925    PT Stop Time  1018    PT Time Calculation (min)  53 min    Equipment Utilized During Treatment  Gait belt    Activity Tolerance  Patient tolerated treatment well;No increased pain    Behavior During Therapy  WFL for tasks assessed/performed       Past Medical History:  Diagnosis Date  . Anemia   . Anxiety   . CHF (congestive heart failure) ([place])   . Chronic kidney disease    esrd dialysis m/w/f  . Gout   . Hyperlipidemia   . Hypertension   . Myocardial infarction ([place]) 2010   10 years ago  . 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  . Peripheral vascular disease [facility])     Past Surgical History:  Procedure Laterality Date  . A/V FISTULAGRAM Right 09/06/2018   Procedure: A/V FISTULAGRAM;  Surgeon: Algernon Huxley, MD;  Location: [place] CV LAB;  Service: Cardiovascular;  Laterality: Right;  . A/V SHUNTOGRAM Left 06/21/2017   Procedure: A/V SHUNTOGRAM;  Surgeon: Katha Cabal, MD;  Location: [place] [facility];  Service: Cardiovascular;  Laterality: Left;  . A/V SHUNTOGRAM N/A 10/24/2018   Procedure: A/V SHUNTOGRAM;  Surgeon: Algernon Huxley, MD;  Location: [place] [facility];  Service: Cardiovascular;  Laterality: N/A;  . ABOVE KNEE LEG AMPUTATION Right 2020  . AMPUTATION Right 10/25/2018   Procedure: AMPUTATION ABOVE KNEE;  Surgeon: Algernon Huxley, MD;  Location: [facility] ORS;   Service: General;  Laterality: Right;  . APPLICATION OF WOUND VAC Right 04/11/2018   Procedure: APPLICATION OF WOUND VAC;  Surgeon: Algernon Huxley, MD;  Location: [facility] ORS;  Service: Vascular;  Laterality: Right;  . AV FISTULA PLACEMENT Left 09/18/2015   Procedure: INSERTION OF ARTERIOVENOUS (AV) GORE-TEX GRAFT ARM ( BRACH/AXILLARY GRAFT W/ INSTANT STICK GRAFT );  Surgeon: Katha Cabal, MD;  Location: [facility] ORS;  Service: Vascular;  Laterality: Left;  . AV FISTULA PLACEMENT Right 07/19/2018   Procedure: INSERTION OF ARTERIOVENOUS (AV) GORE-TEX GRAFT ARM ( BRACHIAL AXILLARY);  Surgeon: Algernon Huxley, MD;  Location: [facility] ORS;  Service: Vascular;  Laterality: Right;  . DIALYSIS FISTULA CREATION Right 10/2017   right chest perm cath  . DIALYSIS/PERMA CATHETER REMOVAL N/A 09/13/2018   Procedure: DIALYSIS/PERMA CATHETER REMOVAL;  Surgeon: Algernon Huxley, MD;  Location: [place] [facility] LAB;  Service: Cardiovascular;  Laterality: N/A;  . ESOPHAGOGASTRODUODENOSCOPY N/A 12/19/2017   Procedure: ESOPHAGOGASTRODUODENOSCOPY (EGD);  Surgeon: Lin Landsman, MD;  Location: [facility] ENDOSCOPY;  Service: Gastroenterology;  Laterality: N/A;  . LOWER EXTREMITY ANGIOGRAPHY Left 11/16/2017   Procedure: LOWER EXTREMITY ANGIOGRAPHY;  Surgeon: Algernon Huxley, MD;  Location: [place] [facility];  Service: Cardiovascular;  Laterality: Left;  . LOWER EXTREMITY ANGIOGRAPHY Right 01/18/2018   Procedure: LOWER EXTREMITY ANGIOGRAPHY;  Surgeon: Lucky Cowboy,  Erskine Squibb, MD;  Location: [place] [facility];  Service: Cardiovascular;  Laterality: Right;  . LOWER EXTREMITY ANGIOGRAPHY Left 04/02/2018   Procedure: LOWER EXTREMITY ANGIOGRAPHY;  Surgeon: Algernon Huxley, MD;  Location: [place] [facility];  Service: Cardiovascular;  Laterality: Left;  . LOWER EXTREMITY ANGIOGRAPHY Right 04/09/2018   Procedure: Lower Extremity Angiography with possible intervention;  Surgeon: Algernon Huxley, MD;  Location: [place] [facility];  Service: Cardiovascular;   Laterality: Right;  . LOWER EXTREMITY ANGIOGRAPHY Right 07/23/2018   Procedure: Lower Extremity Angiography;  Surgeon: Algernon Huxley, MD;  Location: [place] [facility];  Service: Cardiovascular;  Laterality: Right;  . LOWER EXTREMITY ANGIOGRAPHY Right 09/13/2018   Procedure: LOWER EXTREMITY ANGIOGRAPHY;  Surgeon: Algernon Huxley, MD;  Location: [place] [facility];  Service: Cardiovascular;  Laterality: Right;  . LOWER EXTREMITY VENOGRAPHY Right 09/13/2018   Procedure: LOWER EXTREMITY VENOGRAPHY;  Surgeon: Algernon Huxley, MD;  Location: [place] [facility];  Service: Cardiovascular;  Laterality: Right;  . PERIPHERAL VASCULAR CATHETERIZATION Left 09/01/2015   Procedure: A/V Shuntogram/Fistulagram;  Surgeon: Katha Cabal, MD;  Location: [place] [facility];  Service: Cardiovascular;  Laterality: Left;  . PERIPHERAL VASCULAR CATHETERIZATION N/A 09/30/2015   Procedure: A/V Shuntogram/Fistulagram with perm cathether removal;  Surgeon: Algernon Huxley, MD;  Location: [place] [facility];  Service: Cardiovascular;  Laterality: N/A;  . PERIPHERAL VASCULAR CATHETERIZATION Left 09/30/2015   Procedure: A/V Shunt Intervention;  Surgeon: Algernon Huxley, MD;  Location: [place] [facility];  Service: Cardiovascular;  Laterality: Left;  . PERIPHERAL VASCULAR CATHETERIZATION Left 12/03/2015   Procedure: Thrombectomy;  Surgeon: Algernon Huxley, MD;  Location: [place] [facility];  Service: Cardiovascular;  Laterality: Left;  . PERIPHERAL VASCULAR CATHETERIZATION Left 01/28/2016   Procedure: Thrombectomy;  Surgeon: Algernon Huxley, MD;  Location: [place] [facility];  Service: Cardiovascular;  Laterality: Left;  . PERIPHERAL VASCULAR CATHETERIZATION N/A 01/28/2016   Procedure: A/V Shuntogram/Fistulagram;  Surgeon: Algernon Huxley, MD;  Location: [place] [facility];  Service: Cardiovascular;  Laterality: N/A;  . SKIN SPLIT GRAFT Right 05/24/2018   Procedure: SKIN GRAFT SPLIT THICKNESS ( RIGHT CALF);  Surgeon: Algernon Huxley, MD;   Location: [facility] ORS;  Service: Vascular;  Laterality: Right;  . UPPER EXTREMITY ANGIOGRAPHY  10/24/2018   Procedure: Upper Extremity Angiography;  Surgeon: Algernon Huxley, MD;  Location: [place] [facility];  Service: Cardiovascular;;  . UPPER EXTREMITY ANGIOGRAPHY Right 02/14/2019   Procedure: UPPER EXTREMITY ANGIOGRAPHY;  Surgeon: Algernon Huxley, MD;  Location: [place] [facility];  Service: Cardiovascular;  Laterality: Right;  . WOUND DEBRIDEMENT Right 04/11/2018   Procedure: DEBRIDEMENT WOUND calf muscle and skin;  Surgeon: Algernon Huxley, MD;  Location: [facility] ORS;  Service: Vascular;  Laterality: Right;    There were no vitals filed for this visit.  Subjective Assessment - 08/15/19 0922    Subjective  Patient reported that he is doing well today, has been doing his exercises at home. No falls/stumbles since last PT visit.    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 Pt DC hospital to SNF for rehab. He has since been back at home. Pt uses a manual WC or power chair for daily mobility. Pt takes tranportation services to HD MWF. Pt was recently fitted for his AKA prosthesis in Jan 2021.    Limitations  Standing;Walking;House hold activities    How long can  you sit comfortably?  No difficulty    How long can you stand comfortably?  1 minute    How long can you walk comfortably?  unable    Currently in Pain?  No/denies       TREATMENT  Functional Activities Stand pivot to the R with modAand RW, significant assistance needed, verbal cues at start of session  Stand pivot to L with minA and RW, improved ability to perform towards L, still needed step by step sequencing and assistance for weight shift and RW management.  Ther-ex Seated marches2x15bilateral;second set with GTB resistance around knees   Seated hip adduction, one leg at a time, PT assist and verbal cues needed due to difficulty stabilizing RLE x15, and then  bilaterally x15  Seated abduction with GTB one leg at a a time, 2x15 with tactile/verbal cues  GTB around residual limb; resisted hip extension/march RLE 15x 2 sets  Sit to stands with standing weight shifts 4 rounds cues for sequencing x10 weight shifts once in standing  Sit to stand with blue foam under LLE for weight shift, Min A for balance and shift t towards prosthesis for x12  Standing with RW, marching for weight shift, locking/unlocking of prosthetic limb 10x.   Education and performance of stand pivot transfer with focus on walker placement, foot movement/sequencing, and safety awareness.   Gait Training Performed gait training with patientusing rolling walker.Education with patient regarding gait sequencing including advancing walker,unlocking R knee, utilizing momentum to create heel strike on the right, and to lock out right knee. Then shift weight onto RLE and step with LLE utilizing step-to pattern. Pt requires repeated cues for R hip extension to lock RLE after initial heel strike.Hedoes not have any episodes where he doesn't properly lock his knee and no buckling today.CGAand wheelchair follow. Pt requires cues for upright postureas well as control step length/walker distance from body.He is able to ambulate 10 today. He continues to demonstrateheavy BUE support on walker handles during gait. Fatigue monitored by therapist throughout.    Pt educated throughout session about proper posture and technique with exercises. Improved exercise technique, movement at target joints, use of target muscles after min to mod verbal, visual, tactile cues.    pt response/clinical impression: The patient reported fatigue at end of session but stated it felt like a good workout. Constant mutlimodal cueing in weight bearing activities to ensure prosthetic locking, weight shift, upright posture and glute activation with fair carryover, more difficulty noted as patient fatigued.  Overall pt did a good job of remembering to unlock knee prior to attempting to sit. The patient would benefit from further skilled PT intervention to continue to progress towards goals.     PT Education - 08/15/19 0076    Education Details  exercise technique, body mechanics    Person(s) Educated  Patient    Methods  Explanation;Demonstration;Tactile cues;Verbal cues    Comprehension  Verbalized understanding;Returned demonstration;Verbal cues required;Tactile cues required;Need further instruction       PT Short Term Goals - 08/01/19 1015      PT SHORT TERM GOAL #1   Title  Pt to demonstrate ability to perfrom STS transfer from chair c RW minGuard assist and lock his RLE knee joint without assist.    Baseline  ModA from elevated surface at eval;   Now able to perform with supervision from chair +2 airexpads.   Time  4    Period  Weeks    Status  Partially Met  Target Date  06/25/19      PT SHORT TERM GOAL #2   Title  Pt to demonstrate 5 degrees hip flexion 10 degrees hip abdct ROM.    Baseline  lacks 5 degrees from neutral in both at eval; At visit 10, has 0 degrees flexion/extension; still lacking 5 degrees ABDCT neutral    Time  4    Period  Weeks    Status  Partially Met    Target Date  06/25/19        PT Long Term Goals - 08/01/19 1015      PT LONG TERM GOAL #1   Title  Pt to demonstrates 15 degrees Rt hip ABDCT, 10 degrees Rt hip extension P/ROM to facilitate prosthesis motor control.    Time  8    Period  Weeks    Status  On-going    Target Date  10/22/19      PT LONG TERM GOAL #2   Title  Pt to demonstrate 5/5 strength hip extension at 0 degrees flexion/extension    Baseline  has 5/5 in some ranges, but not in ranges specific to standing or gait.    Time  8    Period  Weeks    Status  On-going    Target Date  10/22/19      PT LONG TERM GOAL #3   Title  Pt able to perfrom 5x STS from chair with RW, arms ad lib in <21sec    Baseline  At visit 10 requires  chair +2 pads +RW    Time  8    Period  Weeks    Status  On-going    Target Date  10/22/19            Plan - 08/15/19 8657    Clinical Impression Statement  The patient reported fatigue at end of session but stated it felt like a good workout. Constant mutlimodal cueing in weight bearing activities to ensure prosthetic locking, weight shift, upright posture and glute activation with fair carryover, more difficulty noted as patient fatigued. Overall pt did a good job of remembering to unlock knee prior to attempting to sit. The patient would benefit from further skilled PT intervention to continue to progress towards goals.    Personal Factors and Comorbidities  Age;Fitness;Past/Current Experience;Education;Transportation    Examination-Activity Limitations  Transfers;Dressing;Squat;Stairs;Stand;Bed Mobility    Examination-Participation Restrictions  Meal Prep;Cleaning;Community Activity;Driving;Yard Work;Laundry;Shop    Stability/Clinical Decision Making  Stable/Uncomplicated    Rehab Potential  Good    PT Frequency  2x / week    PT Duration  12 weeks    PT Treatment/Interventions  ADLs/Self Care Home Management;Cryotherapy;Electrical Stimulation;DME Instruction;Gait training;Stair training;Functional mobility training;Therapeutic activities;Therapeutic exercise;Balance training;Neuromuscular re-education;Cognitive remediation;Patient/family education;Prosthetic Training;Wheelchair mobility training;Passive range of motion;Dry needling;Scar mobilization;Spinal Manipulations;Joint Manipulations    PT Next Visit Plan  Strength, balance, transfers, side stepping, consider forward stepping in // bars with close CGA and chair follow;    PT Home Exercise Plan  Medbridge Access Code: [email or web address] (prone positioning), L single leg bridges, L sidelying R hip abduction, hip isometric adduction    Consulted and Agree with Plan of Care  Patient       Patient will benefit from skilled therapeutic  intervention in order to improve the following deficits and impairments:  Decreased activity tolerance, Decreased balance, Decreased mobility, Decreased strength, Decreased cognition, Decreased knowledge of use of DME, Decreased knowledge of precautions, Decreased endurance, Decreased range of motion, Difficulty walking, Decreased safety awareness, Decreased  skin integrity, Decreased scar mobility, Hypomobility, Postural dysfunction, Increased edema, Increased muscle spasms, Increased fascial restricitons, Prosthetic Dependency, Impaired tone, Improper body mechanics  Visit Diagnosis: Difficulty in walking, not elsewhere classified  Unsteadiness on feet     Problem List Patient Active Problem List   Diagnosis Date Noted  . Cardiomyopathy ([place]) 01/22/2019  . CHF (congestive heart failure) ([place]) 01/22/2019  . Coronary disease 01/22/2019  . Above knee amputation of right lower extremity ([place]) 11/13/2018  . Leg ulcer, right, with fat layer exposed ([place]) 10/22/2018  . Cellulitis of right leg 07/21/2018  . Lower limb ulcer, calf, right, limited to breakdown of skin ([place]) 06/08/2018  . PVD (peripheral vascular disease) ([place]) 04/17/2018  . Malnutrition of moderate degree 04/11/2018  . Pressure injury of skin 04/06/2018  . Altered mental status 04/04/2018  . Hypothermia 04/04/2018  . Hemodialysis graft malfunction ([place]) 03/26/2018  . Hypercholesterolemia 02/27/2018  . Diabetes ([place]) 02/27/2018  . Weakness of right lower extremity 01/20/2018  . Fever   . Periumbilical abdominal pain   . Confusion 12/22/2017  . Acute delirium 12/21/2017  . Protein-calorie malnutrition, severe 12/19/2017  . Intractable nausea and vomiting 12/18/2017  . Lymphedema 12/13/2017  . Cellulitis 11/27/2017  . Chest pain 11/19/2017  . Atherosclerosis of native arteries of the extremities with ulceration ([place]) 11/07/2017  . Twitching 01/03/2017  . Elevated troponin 10/02/2015  . Complications, dialysis, catheter,  mechanical ([place]) 10/02/2015  . Musculoskeletal chest pain 09/28/2015  . Chronic diastolic CHF (congestive heart failure) ([place]) 09/28/2015  . End stage renal disease ([place]) 10/09/2012  . Hypertension 10/09/2012  . Gout 10/09/2012    Lieutenant Diego PT, DPT 10:30 AM,08/15/19   [place] MAIN [facility] SERVICES [address] [place], [place], [address] Phone: [phone]   Fax:  [phone]  Name: Johnny [name]. MRN: [id number] Date of Birth: 1950/09/26

## 2019-08-20 ENCOUNTER — Ambulatory Visit: Payer: Medicare Other

## 2019-08-20 ENCOUNTER — Other Ambulatory Visit: Payer: Self-pay

## 2019-08-20 DIAGNOSIS — R2681 Unsteadiness on feet: Secondary | ICD-10-CM

## 2019-08-20 DIAGNOSIS — R262 Difficulty in walking, not elsewhere classified: Secondary | ICD-10-CM

## 2019-08-20 NOTE — Therapy (Signed)
[place] MAIN [facility] SERVICES [address] [place], [place], [address] Phone: [phone]   Fax:  [phone]  Physical Therapy Treatment  Patient Details  Name: Johnny [name]. MRN: [id number] Date of Birth: 10-02-1950 Referring Provider (PT): Eulogio Ditch, NP    Encounter Date: 08/20/2019  PT End of Session - 08/20/19 0849    Visit Number  18    Number of Visits  41    Date for PT Re-Evaluation  10/22/19    Authorization Type  [employer] Medicare    Authorization Time Period  --    PT Start Time  0802    PT Stop Time  0846    PT Time Calculation (min)  44 min    Equipment Utilized During Treatment  Gait belt    Activity Tolerance  Patient tolerated treatment well;No increased pain    Behavior During Therapy  WFL for tasks assessed/performed       Past Medical History:  Diagnosis Date  . Anemia   . Anxiety   . CHF (congestive heart failure) ([place])   . Chronic kidney disease    esrd dialysis m/w/f  . Gout   . Hyperlipidemia   . Hypertension   . Myocardial infarction ([place]) 2010   10 years ago  . 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  . Peripheral vascular disease [facility])     Past Surgical History:  Procedure Laterality Date  . A/V FISTULAGRAM Right 09/06/2018   Procedure: A/V FISTULAGRAM;  Surgeon: Algernon Huxley, MD;  Location: [place] CV LAB;  Service: Cardiovascular;  Laterality: Right;  . A/V SHUNTOGRAM Left 06/21/2017   Procedure: A/V SHUNTOGRAM;  Surgeon: Katha Cabal, MD;  Location: [place] CV LAB;  Service: Cardiovascular;  Laterality: Left;  . A/V SHUNTOGRAM N/A 10/24/2018   Procedure: A/V SHUNTOGRAM;  Surgeon: Algernon Huxley, MD;  Location: [place] CV LAB;  Service: Cardiovascular;  Laterality: N/A;  . ABOVE KNEE LEG AMPUTATION Right 2020  . AMPUTATION Right 10/25/2018   Procedure: AMPUTATION ABOVE KNEE;  Surgeon: Algernon Huxley, MD;  Location: [facility] ORS;  Service: General;   Laterality: Right;  . APPLICATION OF WOUND VAC Right 04/11/2018   Procedure: APPLICATION OF WOUND VAC;  Surgeon: Algernon Huxley, MD;  Location: [facility] ORS;  Service: Vascular;  Laterality: Right;  . AV FISTULA PLACEMENT Left 09/18/2015   Procedure: INSERTION OF ARTERIOVENOUS (AV) GORE-TEX GRAFT ARM ( BRACH/AXILLARY GRAFT W/ INSTANT STICK GRAFT );  Surgeon: Katha Cabal, MD;  Location: [facility] ORS;  Service: Vascular;  Laterality: Left;  . AV FISTULA PLACEMENT Right 07/19/2018   Procedure: INSERTION OF ARTERIOVENOUS (AV) GORE-TEX GRAFT ARM ( BRACHIAL AXILLARY);  Surgeon: Algernon Huxley, MD;  Location: [facility] ORS;  Service: Vascular;  Laterality: Right;  . DIALYSIS FISTULA CREATION Right 10/2017   right chest perm cath  . DIALYSIS/PERMA CATHETER REMOVAL N/A 09/13/2018   Procedure: DIALYSIS/PERMA CATHETER REMOVAL;  Surgeon: Algernon Huxley, MD;  Location: [place] CV LAB;  Service: Cardiovascular;  Laterality: N/A;  . ESOPHAGOGASTRODUODENOSCOPY N/A 12/19/2017   Procedure: ESOPHAGOGASTRODUODENOSCOPY (EGD);  Surgeon: Lin Landsman, MD;  Location: [facility] ENDOSCOPY;  Service: Gastroenterology;  Laterality: N/A;  . LOWER EXTREMITY ANGIOGRAPHY Left 11/16/2017   Procedure: LOWER EXTREMITY ANGIOGRAPHY;  Surgeon: Algernon Huxley, MD;  Location: [place] [facility] LAB;  Service: Cardiovascular;  Laterality: Left;  . LOWER EXTREMITY ANGIOGRAPHY Right 01/18/2018   Procedure: LOWER EXTREMITY ANGIOGRAPHY;  Surgeon: Lucky Cowboy,  Erskine Squibb, MD;  Location: [place] CV LAB;  Service: Cardiovascular;  Laterality: Right;  . LOWER EXTREMITY ANGIOGRAPHY Left 04/02/2018   Procedure: LOWER EXTREMITY ANGIOGRAPHY;  Surgeon: Algernon Huxley, MD;  Location: [place] CV LAB;  Service: Cardiovascular;  Laterality: Left;  . LOWER EXTREMITY ANGIOGRAPHY Right 04/09/2018   Procedure: Lower Extremity Angiography with possible intervention;  Surgeon: Algernon Huxley, MD;  Location: [place] [facility] LAB;  Service: Cardiovascular;  Laterality: Right;  .  LOWER EXTREMITY ANGIOGRAPHY Right 07/23/2018   Procedure: Lower Extremity Angiography;  Surgeon: Algernon Huxley, MD;  Location: [place] CV LAB;  Service: Cardiovascular;  Laterality: Right;  . LOWER EXTREMITY ANGIOGRAPHY Right 09/13/2018   Procedure: LOWER EXTREMITY ANGIOGRAPHY;  Surgeon: Algernon Huxley, MD;  Location: [place] CV LAB;  Service: Cardiovascular;  Laterality: Right;  . LOWER EXTREMITY VENOGRAPHY Right 09/13/2018   Procedure: LOWER EXTREMITY VENOGRAPHY;  Surgeon: Algernon Huxley, MD;  Location: [place] [facility];  Service: Cardiovascular;  Laterality: Right;  . PERIPHERAL VASCULAR CATHETERIZATION Left 09/01/2015   Procedure: A/V Shuntogram/Fistulagram;  Surgeon: Katha Cabal, MD;  Location: [name] [facility] LAB;  Service: Cardiovascular;  Laterality: Left;  . PERIPHERAL VASCULAR CATHETERIZATION N/A 09/30/2015   Procedure: A/V Shuntogram/Fistulagram with perm cathether removal;  Surgeon: Algernon Huxley, MD;  Location: [place] [facility] LAB;  Service: Cardiovascular;  Laterality: N/A;  . PERIPHERAL VASCULAR CATHETERIZATION Left 09/30/2015   Procedure: A/V Shunt Intervention;  Surgeon: Algernon Huxley, MD;  Location: [place] [facility] LAB;  Service: Cardiovascular;  Laterality: Left;  . PERIPHERAL VASCULAR CATHETERIZATION Left 12/03/2015   Procedure: Thrombectomy;  Surgeon: Algernon Huxley, MD;  Location: [place] CV LAB;  Service: Cardiovascular;  Laterality: Left;  . PERIPHERAL VASCULAR CATHETERIZATION Left 01/28/2016   Procedure: Thrombectomy;  Surgeon: Algernon Huxley, MD;  Location: [place] CV LAB;  Service: Cardiovascular;  Laterality: Left;  . PERIPHERAL VASCULAR CATHETERIZATION N/A 01/28/2016   Procedure: A/V Shuntogram/Fistulagram;  Surgeon: Algernon Huxley, MD;  Location: [place] CV LAB;  Service: Cardiovascular;  Laterality: N/A;  . SKIN SPLIT GRAFT Right 05/24/2018   Procedure: SKIN GRAFT SPLIT THICKNESS ( RIGHT CALF);  Surgeon: Algernon Huxley, MD;  Location: [facility] ORS;  Service:  Vascular;  Laterality: Right;  . UPPER EXTREMITY ANGIOGRAPHY  10/24/2018   Procedure: Upper Extremity Angiography;  Surgeon: Algernon Huxley, MD;  Location: [place] [facility] LAB;  Service: Cardiovascular;;  . UPPER EXTREMITY ANGIOGRAPHY Right 02/14/2019   Procedure: UPPER EXTREMITY ANGIOGRAPHY;  Surgeon: Algernon Huxley, MD;  Location: [place] CV LAB;  Service: Cardiovascular;  Laterality: Right;  . WOUND DEBRIDEMENT Right 04/11/2018   Procedure: DEBRIDEMENT WOUND calf muscle and skin;  Surgeon: Algernon Huxley, MD;  Location: [facility] ORS;  Service: Vascular;  Laterality: Right;    There were no vitals filed for this visit.  Subjective Assessment - 08/20/19 0800    Subjective  Patient reported that he is doing well today, has been doing his exercises at home. No falls/stumbles since last PT visit. He has been walking at home with assistance. He reports some R lateral hip pain occasionally with activity which is new. He denies any pain at rest. He is concerned that his prosthetic R leg might be too heavy and causing problems.    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 Pt DC hospital to SNF for rehab. He has since been back at home. Pt  uses a manual WC or power chair for daily mobility. Pt takes tranportation services to HD MWF. Pt was recently fitted for his AKA prosthesis in Jan 2021.    Limitations  Standing;Walking;House hold activities    How long can you sit comfortably?  No difficulty    How long can you stand comfortably?  1 minute    How long can you walk comfortably?  unable    Currently in Pain?  No/denies            TREATMENT   Ther-ex Initial sit to stand from elevated mat table in order for therapist to assist in seated RLE farther down into socket Seated marches x 20 bilateral; Seated clams with manual resistancex 20bilateral; Seated adductor squeeze with manual resistancex 20bilateral;   Gait  Training Performed gait training with patientusing rolling walker.Education with patient regarding gait sequencing including advancing walker,unlocking R knee, utilizing momentum to create heel strike on the right, and to lock out right knee. Then shift weight onto RLE and step with LLE utilizing step-to pattern. Pt requires less cues today however his residual limb is fitting farther down his socket making his RLE shorter and causing him to occasionally miss his heel strike. He has only one episode where he doesn't fully lock his knee before stepping.CGAand wheelchair follow today by second PT. Pt requires cues for upright postureas well as control step length/walker distance from body. He is able to ambulate 40', 45', and 35' today with seated rest breaks between bouts. He continues to demonstrate heavy BUE support on walker handles during gait. Fatigue monitored by therapist throughout.   Pt educated throughout session about proper posture and technique with exercises. Improved exercise technique, movement at target joints, use of target muscles after min to mod verbal, visual, tactile cues.   Ptis making excellent progress with therapy and continues to demonstrate excellent motivation. He is able to complete all of his seated exercises with therapy todayand included some hooklying and sidelying exercises as well. Hecontinues to require assistance withfittingRLE into prosthesis completely. He has been wearing his RLE prosthesis longer at home and his residual limb is fitting farther down the socket and is less than 1 inch from the bottom today. R lateral hip pain appears to be muscular and likely related to increased reliance on RLE during more frequent ambulation. Advised pt that he can follow up with prosthetist from [place] but likely no significant weight reduction possible. Performed gait training again with patientusing rolling walker.Education with patient regarding gait sequencing  including advancing walker,unlocking R knee, utilizing momentum to create heel strike on the right, and to lock out right knee. Then shift weight onto RLE and step with LLE utilizing step-to pattern. Pt requires less cues today however his residual limb is fitting farther down his socket making his RLE shorter and causing him to occasionally miss his heel strike. He has only one episode where he doesn't fully lock his knee before stepping.CGAand wheelchair follow today by second PT. Pt requires cues for upright postureas well as control step length/walker distance from body. He is able to ambulate 40', 45', and 35' today with seated rest breaks between bouts. He continues to demonstrate heavy BUE support on walker handles during gait. Pt advised that he can try some ambulation at home with CGA by friends/family with wheelchair follow.Pt encouraged to continue HEP. Hewill benefit from PT services to address deficits in strength, balance, and mobility in order to return to full function at home.  PT Short Term Goals - 08/01/19 1015      PT SHORT TERM GOAL #1   Title  Pt to demonstrate ability to perfrom STS transfer from chair c RW minGuard assist and lock his RLE knee joint without assist.    Baseline  ModA from elevated surface at eval;   Now able to perform with supervision from chair +2 airexpads.   Time  4    Period  Weeks    Status  Partially Met    Target Date  06/25/19      PT SHORT TERM GOAL #2   Title  Pt to demonstrate 5 degrees hip flexion 10 degrees hip abdct ROM.    Baseline  lacks 5 degrees from neutral in both at eval; At visit 10, has 0 degrees flexion/extension; still lacking 5 degrees ABDCT neutral    Time  4    Period  Weeks    Status  Partially Met    Target Date  06/25/19        PT Long Term Goals - 08/01/19 1015      PT LONG TERM GOAL #1   Title  Pt to demonstrates 15 degrees Rt hip ABDCT, 10 degrees Rt hip extension P/ROM  to facilitate prosthesis motor control.    Time  8    Period  Weeks    Status  On-going    Target Date  10/22/19      PT LONG TERM GOAL #2   Title  Pt to demonstrate 5/5 strength hip extension at 0 degrees flexion/extension    Baseline  has 5/5 in some ranges, but not in ranges specific to standing or gait.    Time  8    Period  Weeks    Status  On-going    Target Date  10/22/19      PT LONG TERM GOAL #3   Title  Pt able to perfrom 5x STS from chair with RW, arms ad lib in <21sec    Baseline  At visit 10 requires chair +2 pads +RW    Time  8    Period  Weeks    Status  On-going    Target Date  10/22/19            Plan - 08/20/19 0849    Clinical Impression Statement  Pt is making excellent progress with therapy and continues to demonstrate excellent motivation. He is able to complete all of his seated exercises with therapy today and included some hooklying and sidelying exercises as well. He continues to require assistance with fitting RLE into prosthesis completely. He has been wearing his RLE prosthesis longer at home and his residual limb is fitting farther down the socket and is less than 1 inch from the bottom today. R lateral hip pain appears to be muscular and likely related to increased reliance on RLE during more frequent ambulation. Advised pt that he can follow up with prosthetist from [place] but likely no significant weight reduction possible. Performed gait training again with patient using rolling walker. Education with patient regarding gait sequencing including advancing walker, unlocking R knee, utilizing momentum to create heel strike on the right, and to lock out right knee. Then shift weight onto RLE and step with LLE utilizing step-to pattern. Pt requires less cues today however his residual limb is fitting farther down his socket making his RLE shorter and causing him to occasionally miss his heel strike. He has only one episode where he doesn't fully  lock his  knee before stepping. CGA and wheelchair follow today by second PT. Pt requires cues for upright posture as well as control step length/walker distance from body. He is able to ambulate 40', 45', and 35' today with seated rest breaks between bouts. He continues to demonstrate heavy BUE support on walker handles during gait. Pt advised that he can try some ambulation at home with CGA by friends/family with wheelchair follow. Pt encouraged to continue HEP. He will benefit from PT services to address deficits in strength, balance, and mobility in order to return to full function at home.    Personal Factors and Comorbidities  Age;Fitness;Past/Current Experience;Education;Transportation    Examination-Activity Limitations  Transfers;Dressing;Squat;Stairs;Stand;Bed Mobility    Examination-Participation Restrictions  Meal Prep;Cleaning;Community Activity;Driving;Yard Work;Laundry;Shop    Stability/Clinical Decision Making  Stable/Uncomplicated    Rehab Potential  Good    PT Frequency  2x / week    PT Duration  12 weeks    PT Treatment/Interventions  ADLs/Self Care Home Management;Cryotherapy;Electrical Stimulation;DME Instruction;Gait training;Stair training;Functional mobility training;Therapeutic activities;Therapeutic exercise;Balance training;Neuromuscular re-education;Cognitive remediation;Patient/family education;Prosthetic Training;Wheelchair mobility training;Passive range of motion;Dry needling;Scar mobilization;Spinal Manipulations;Joint Manipulations    PT Next Visit Plan  Strength, balance, transfers, side stepping, consider forward stepping in // bars with close CGA and chair follow;    PT Home Exercise Plan  Medbridge Access Code: [email or web address] (prone positioning), L single leg bridges, L sidelying R hip abduction, hip isometric adduction    Consulted and Agree with Plan of Care  Patient       Patient will benefit from skilled therapeutic intervention in order to improve the following deficits and  impairments:  Decreased activity tolerance, Decreased balance, Decreased mobility, Decreased strength, Decreased cognition, Decreased knowledge of use of DME, Decreased knowledge of precautions, Decreased endurance, Decreased range of motion, Difficulty walking, Decreased safety awareness, Decreased skin integrity, Decreased scar mobility, Hypomobility, Postural dysfunction, Increased edema, Increased muscle spasms, Increased fascial restricitons, Prosthetic Dependency, Impaired tone, Improper body mechanics  Visit Diagnosis: Difficulty in walking, not elsewhere classified  Unsteadiness on feet     Problem List Patient Active Problem List   Diagnosis Date Noted  . Cardiomyopathy ([place]) 01/22/2019  . CHF (congestive heart failure) ([place]) 01/22/2019  . Coronary disease 01/22/2019  . Above knee amputation of right lower extremity ([place]) 11/13/2018  . Leg ulcer, right, with fat layer exposed ([place]) 10/22/2018  . Cellulitis of right leg 07/21/2018  . Lower limb ulcer, calf, right, limited to breakdown of skin ([place]) 06/08/2018  . PVD (peripheral vascular disease) ([place]) 04/17/2018  . Malnutrition of moderate degree 04/11/2018  . Pressure injury of skin 04/06/2018  . Altered mental status 04/04/2018  . Hypothermia 04/04/2018  . Hemodialysis graft malfunction ([place]) 03/26/2018  . Hypercholesterolemia 02/27/2018  . Diabetes ([place]) 02/27/2018  . Weakness of right lower extremity 01/20/2018  . Fever   . Periumbilical abdominal pain   . Confusion 12/22/2017  . Acute delirium 12/21/2017  . Protein-calorie malnutrition, severe 12/19/2017  . Intractable nausea and vomiting 12/18/2017  . Lymphedema 12/13/2017  . Cellulitis 11/27/2017  . Chest pain 11/19/2017  . Atherosclerosis of native arteries of the extremities with ulceration ([place]) 11/07/2017  . Twitching 01/03/2017  . Elevated troponin 10/02/2015  . Complications, dialysis, catheter, mechanical ([place]) 10/02/2015  . Musculoskeletal chest pain  09/28/2015  . Chronic diastolic CHF (congestive heart failure) ([place]) 09/28/2015  . End stage renal disease ([place]) 10/09/2012  . Hypertension 10/09/2012  . Gout 10/09/2012   Lyndel Safe Drevin Ortner PT, DPT, GCS  Haely Leyland  08/20/2019, 10:04 AM  [place] MAIN [facility] SERVICES [address] [place], [place], [address] Phone: [phone]   Fax:  [phone]  Name: Kham Zuckerman. MRN: [id number] Date of Birth: 12/11/1950

## 2019-08-22 ENCOUNTER — Ambulatory Visit: Payer: Medicare Other

## 2019-08-27 ENCOUNTER — Other Ambulatory Visit: Payer: Self-pay

## 2019-08-27 ENCOUNTER — Ambulatory Visit: Payer: Medicare Other

## 2019-08-27 DIAGNOSIS — R262 Difficulty in walking, not elsewhere classified: Secondary | ICD-10-CM | POA: Diagnosis not present

## 2019-08-27 DIAGNOSIS — R2681 Unsteadiness on feet: Secondary | ICD-10-CM

## 2019-08-27 NOTE — Therapy (Signed)
[place] MAIN [facility] SERVICES [address] [place], [place], [address] Phone: [phone]   Fax:  [phone]  Physical Therapy Treatment  Patient Details  Name: Johnny [name]. MRN: [id number] Date of Birth: 1951-01-11 Referring Provider (PT): Eulogio Ditch, NP    Encounter Date: 08/27/2019  PT End of Session - 08/27/19 0955    Visit Number  19    Number of Visits  41    Date for PT Re-Evaluation  10/22/19    Authorization Type  [employer] Medicare    PT Start Time  [phone]    PT Stop Time  1020    PT Time Calculation (min)  43 min    Equipment Utilized During Treatment  Gait belt    Activity Tolerance  Patient tolerated treatment well;No increased pain    Behavior During Therapy  WFL for tasks assessed/performed       Past Medical History:  Diagnosis Date  . Anemia   . Anxiety   . CHF (congestive heart failure) ([place])   . Chronic kidney disease    esrd dialysis m/w/f  . Gout   . Hyperlipidemia   . Hypertension   . Myocardial infarction ([place]) 2010   10 years ago  . 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  . Peripheral vascular disease [facility])     Past Surgical History:  Procedure Laterality Date  . A/V FISTULAGRAM Right 09/06/2018   Procedure: A/V FISTULAGRAM;  Surgeon: Algernon Huxley, MD;  Location: [place] CV LAB;  Service: Cardiovascular;  Laterality: Right;  . A/V SHUNTOGRAM Left 06/21/2017   Procedure: A/V SHUNTOGRAM;  Surgeon: Katha Cabal, MD;  Location: [place] CV LAB;  Service: Cardiovascular;  Laterality: Left;  . A/V SHUNTOGRAM N/A 10/24/2018   Procedure: A/V SHUNTOGRAM;  Surgeon: Algernon Huxley, MD;  Location: [place] CV LAB;  Service: Cardiovascular;  Laterality: N/A;  . ABOVE KNEE LEG AMPUTATION Right 2020  . AMPUTATION Right 10/25/2018   Procedure: AMPUTATION ABOVE KNEE;  Surgeon: Algernon Huxley, MD;  Location: [facility] ORS;  Service: General;  Laterality: Right;  .  APPLICATION OF WOUND VAC Right 04/11/2018   Procedure: APPLICATION OF WOUND VAC;  Surgeon: Algernon Huxley, MD;  Location: [facility] ORS;  Service: Vascular;  Laterality: Right;  . AV FISTULA PLACEMENT Left 09/18/2015   Procedure: INSERTION OF ARTERIOVENOUS (AV) GORE-TEX GRAFT ARM ( BRACH/AXILLARY GRAFT W/ INSTANT STICK GRAFT );  Surgeon: Katha Cabal, MD;  Location: [facility] ORS;  Service: Vascular;  Laterality: Left;  . AV FISTULA PLACEMENT Right 07/19/2018   Procedure: INSERTION OF ARTERIOVENOUS (AV) GORE-TEX GRAFT ARM ( BRACHIAL AXILLARY);  Surgeon: Algernon Huxley, MD;  Location: [facility] ORS;  Service: Vascular;  Laterality: Right;  . DIALYSIS FISTULA CREATION Right 10/2017   right chest perm cath  . DIALYSIS/PERMA CATHETER REMOVAL N/A 09/13/2018   Procedure: DIALYSIS/PERMA CATHETER REMOVAL;  Surgeon: Algernon Huxley, MD;  Location: [place] CV LAB;  Service: Cardiovascular;  Laterality: N/A;  . ESOPHAGOGASTRODUODENOSCOPY N/A 12/19/2017   Procedure: ESOPHAGOGASTRODUODENOSCOPY (EGD);  Surgeon: Lin Landsman, MD;  Location: [facility] ENDOSCOPY;  Service: Gastroenterology;  Laterality: N/A;  . LOWER EXTREMITY ANGIOGRAPHY Left 11/16/2017   Procedure: LOWER EXTREMITY ANGIOGRAPHY;  Surgeon: Algernon Huxley, MD;  Location: [place] CV LAB;  Service: Cardiovascular;  Laterality: Left;  . LOWER EXTREMITY ANGIOGRAPHY Right 01/18/2018   Procedure: LOWER EXTREMITY ANGIOGRAPHY;  Surgeon: Algernon Huxley, MD;  Location: [place] INVASIVE CV  LAB;  Service: Cardiovascular;  Laterality: Right;  . LOWER EXTREMITY ANGIOGRAPHY Left 04/02/2018   Procedure: LOWER EXTREMITY ANGIOGRAPHY;  Surgeon: Algernon Huxley, MD;  Location: [place] CV LAB;  Service: Cardiovascular;  Laterality: Left;  . LOWER EXTREMITY ANGIOGRAPHY Right 04/09/2018   Procedure: Lower Extremity Angiography with possible intervention;  Surgeon: Algernon Huxley, MD;  Location: [place] CV LAB;  Service: Cardiovascular;  Laterality: Right;  . LOWER EXTREMITY  ANGIOGRAPHY Right 07/23/2018   Procedure: Lower Extremity Angiography;  Surgeon: Algernon Huxley, MD;  Location: [place] CV LAB;  Service: Cardiovascular;  Laterality: Right;  . LOWER EXTREMITY ANGIOGRAPHY Right 09/13/2018   Procedure: LOWER EXTREMITY ANGIOGRAPHY;  Surgeon: Algernon Huxley, MD;  Location: [place] CV LAB;  Service: Cardiovascular;  Laterality: Right;  . LOWER EXTREMITY VENOGRAPHY Right 09/13/2018   Procedure: LOWER EXTREMITY VENOGRAPHY;  Surgeon: Algernon Huxley, MD;  Location: [place] CV LAB;  Service: Cardiovascular;  Laterality: Right;  . PERIPHERAL VASCULAR CATHETERIZATION Left 09/01/2015   Procedure: A/V Shuntogram/Fistulagram;  Surgeon: Katha Cabal, MD;  Location: [place] CV LAB;  Service: Cardiovascular;  Laterality: Left;  . PERIPHERAL VASCULAR CATHETERIZATION N/A 09/30/2015   Procedure: A/V Shuntogram/Fistulagram with perm cathether removal;  Surgeon: Algernon Huxley, MD;  Location: [place] CV LAB;  Service: Cardiovascular;  Laterality: N/A;  . PERIPHERAL VASCULAR CATHETERIZATION Left 09/30/2015   Procedure: A/V Shunt Intervention;  Surgeon: Algernon Huxley, MD;  Location: [place] CV LAB;  Service: Cardiovascular;  Laterality: Left;  . PERIPHERAL VASCULAR CATHETERIZATION Left 12/03/2015   Procedure: Thrombectomy;  Surgeon: Algernon Huxley, MD;  Location: [place] CV LAB;  Service: Cardiovascular;  Laterality: Left;  . PERIPHERAL VASCULAR CATHETERIZATION Left 01/28/2016   Procedure: Thrombectomy;  Surgeon: Algernon Huxley, MD;  Location: [place] CV LAB;  Service: Cardiovascular;  Laterality: Left;  . PERIPHERAL VASCULAR CATHETERIZATION N/A 01/28/2016   Procedure: A/V Shuntogram/Fistulagram;  Surgeon: Algernon Huxley, MD;  Location: [place] CV LAB;  Service: Cardiovascular;  Laterality: N/A;  . SKIN SPLIT GRAFT Right 05/24/2018   Procedure: SKIN GRAFT SPLIT THICKNESS ( RIGHT CALF);  Surgeon: Algernon Huxley, MD;  Location: [facility] ORS;  Service: Vascular;   Laterality: Right;  . UPPER EXTREMITY ANGIOGRAPHY  10/24/2018   Procedure: Upper Extremity Angiography;  Surgeon: Algernon Huxley, MD;  Location: [place] CV LAB;  Service: Cardiovascular;;  . UPPER EXTREMITY ANGIOGRAPHY Right 02/14/2019   Procedure: UPPER EXTREMITY ANGIOGRAPHY;  Surgeon: Algernon Huxley, MD;  Location: [place] CV LAB;  Service: Cardiovascular;  Laterality: Right;  . WOUND DEBRIDEMENT Right 04/11/2018   Procedure: DEBRIDEMENT WOUND calf muscle and skin;  Surgeon: Algernon Huxley, MD;  Location: [facility] ORS;  Service: Vascular;  Laterality: Right;    There were no vitals filed for this visit.  Subjective Assessment - 08/27/19 0942    Subjective  Patient reports that he is doing well today, has been doing his exercises at home. He reports that he has been performing prone positioning but it is difficulty to position himself. He had one stumble while walking at home since last therapy session but did not fall. He denies any pain at rest upon arrival today. No specific questions or concerns currently.    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 Pt DC hospital to SNF for rehab. He has since been back at home. Pt uses a manual WC or power chair for daily  mobility. Pt takes tranportation services to HD MWF. Pt was recently fitted for his AKA prosthesis in Jan 2021.    Limitations  Standing;Walking;House hold activities    How long can you sit comfortably?  No difficulty    How long can you stand comfortably?  1 minute    How long can you walk comfortably?  unable    Currently in Pain?  No/denies         TREATMENT   Ther-ex Initial sit to stand from elevated mat table in order for therapist to assist in seated RLE farther down into socket Seated marches2 x 15 bilateral; Seated clams with manual resistance2 x 15bilateral; Seated adductor squeeze with manual resistance2 x 15bilateral; Seated L LAQ with  manual resistance 2 x 15; Seated L HS curls with manual resistance 2 x 15; Supine manually resisted R hip extension x 10; Hooklying bridges with bolster under knees 3s hold at top x 10; L sidelying R hip abduction with prosthetic donned x 10 and therapist providing light assist;   Gait Training Performed gait training with patientusing rolling walker.Education with patient regarding gait sequencing including advancing walker,unlocking R knee, utilizing momentum to create heel strike on the right, and to lock out right knee. Then shift weight onto RLE and step with LLE utilizing step-to pattern. Pt requires more cues today compared to last session. He initially has a couple buckles due to trying to step before R knee has locked. He also has one episode where he tries to step before advancing the walker. CGAand wheelchair follow today by PT tech Pt requires cues for upright postureas well as control step length/walker distance from body. He has a significantly easier time locking R knee when he is cues for upright posture including R hip extension.He is able to ambulate 44' x 45' today with seated rest breaks between bouts. He continues to demonstrateheavy BUE support on walker handles during gait. Fatigue monitored by therapist throughout.   Pt educated throughout session about proper posture and technique with exercises. Improved exercise technique, movement at target joints, use of target muscles after min to mod verbal, visual, tactile cues.   Ptis making excellent progress with therapy and continues to demonstrate excellent motivation. He is able to complete all of his seated exercises with therapy todayand included some hooklying and sidelying exercises as well. Pt requires more cues today compared to last session. He initially has a couple buckles due to trying to step before R knee has locked. He also has one episode where he tries to step before advancing the walker. CGAand  wheelchair follow today by PT tech Pt requires cues for upright postureas well as control step length/walker distance from body. He has a significantly easier time locking R knee when he is cues for upright posture including R hip extension.He is able to ambulate 40' x 45' today with seated rest breaks between bouts. He continues to demonstrateheavy BUE support on walker handles during gait. Fatigue monitored by therapist throughout.Pt advised that he can try some ambulation at home with CGA by friends/family with wheelchair follow.Pt encouraged to continue HEP. Hewill benefit from PT services to address deficits in strength, balance, and mobility in order to return to full function at home.                         PT Short Term Goals - 08/01/19 1015      PT SHORT TERM GOAL #1   Title  Pt to demonstrate ability to perfrom STS transfer from chair c RW minGuard assist and lock his RLE knee joint without assist.    Baseline  ModA from elevated surface at eval;   Now able to perform with supervision from chair +2 airexpads.   Time  4    Period  Weeks    Status  Partially Met    Target Date  06/25/19      PT SHORT TERM GOAL #2   Title  Pt to demonstrate 5 degrees hip flexion 10 degrees hip abdct ROM.    Baseline  lacks 5 degrees from neutral in both at eval; At visit 10, has 0 degrees flexion/extension; still lacking 5 degrees ABDCT neutral    Time  4    Period  Weeks    Status  Partially Met    Target Date  06/25/19        PT Long Term Goals - 08/01/19 1015      PT LONG TERM GOAL #1   Title  Pt to demonstrates 15 degrees Rt hip ABDCT, 10 degrees Rt hip extension P/ROM to facilitate prosthesis motor control.    Time  8    Period  Weeks    Status  On-going    Target Date  10/22/19      PT LONG TERM GOAL #2   Title  Pt to demonstrate 5/5 strength hip extension at 0 degrees flexion/extension    Baseline  has 5/5 in some ranges, but not in ranges specific to  standing or gait.    Time  8    Period  Weeks    Status  On-going    Target Date  10/22/19      PT LONG TERM GOAL #3   Title  Pt able to perfrom 5x STS from chair with RW, arms ad lib in <21sec    Baseline  At visit 10 requires chair +2 pads +RW    Time  8    Period  Weeks    Status  On-going    Target Date  10/22/19            Plan - 08/27/19 1154    Clinical Impression Statement  Pt is making excellent progress with therapy and continues to demonstrate excellent motivation. He is able to complete all of his seated exercises with therapy today and included some hooklying and sidelying exercises as well. Pt requires more cues today compared to last session. He initially has a couple buckles due to trying to step before R knee has locked. He also has one episode where he tries to step before advancing the walker. CGA and wheelchair follow today by PT tech Pt requires cues for upright posture as well as control step length/walker distance from body. He has a significantly easier time locking R knee when he is cues for upright posture including R hip extension. He is able to ambulate 44' x 45' today with seated rest breaks between bouts. He continues to demonstrate heavy BUE support on walker handles during gait. Fatigue monitored by therapist throughout. Pt advised that he can try some ambulation at home with CGA by friends/family with wheelchair follow. Pt encouraged to continue HEP. He will benefit from PT services to address deficits in strength, balance, and mobility in order to return to full function at home.    Personal Factors and Comorbidities  Age;Fitness;Past/Current Experience;Education;Transportation    Examination-Activity Limitations  Transfers;Dressing;Squat;Stairs;Stand;Bed Mobility    Examination-Participation Restrictions  Meal Prep;Cleaning;Community  Activity;Driving;Yard Work;Laundry;Shop    Stability/Clinical Decision Making  Stable/Uncomplicated    Rehab Potential   Good    PT Frequency  2x / week    PT Duration  12 weeks    PT Treatment/Interventions  ADLs/Self Care Home Management;Cryotherapy;Electrical Stimulation;DME Instruction;Gait training;Stair training;Functional mobility training;Therapeutic activities;Therapeutic exercise;Balance training;Neuromuscular re-education;Cognitive remediation;Patient/family education;Prosthetic Training;Wheelchair mobility training;Passive range of motion;Dry needling;Scar mobilization;Spinal Manipulations;Joint Manipulations    PT Next Visit Plan  Update outcome measures, goals, and progress note. Strength, balance, transfers, and gait training with CGA and wheelchair follow    PT Home Exercise Plan  Medbridge Access Code: [email or web address] (prone positioning), L single leg bridges, L sidelying R hip abduction, hip isometric adduction    Consulted and Agree with Plan of Care  Patient       Patient will benefit from skilled therapeutic intervention in order to improve the following deficits and impairments:  Decreased activity tolerance, Decreased balance, Decreased mobility, Decreased strength, Decreased cognition, Decreased knowledge of use of DME, Decreased knowledge of precautions, Decreased endurance, Decreased range of motion, Difficulty walking, Decreased safety awareness, Decreased skin integrity, Decreased scar mobility, Hypomobility, Postural dysfunction, Increased edema, Increased muscle spasms, Increased fascial restricitons, Prosthetic Dependency, Impaired tone, Improper body mechanics  Visit Diagnosis: Difficulty in walking, not elsewhere classified  Unsteadiness on feet     Problem List Patient Active Problem List   Diagnosis Date Noted  . Cardiomyopathy ([place]) 01/22/2019  . CHF (congestive heart failure) ([place]) 01/22/2019  . Coronary disease 01/22/2019  . Above knee amputation of right lower extremity ([place]) 11/13/2018  . Leg ulcer, right, with fat layer exposed ([place]) 10/22/2018  . Cellulitis of right leg  07/21/2018  . Lower limb ulcer, calf, right, limited to breakdown of skin ([place]) 06/08/2018  . PVD (peripheral vascular disease) ([place]) 04/17/2018  . Malnutrition of moderate degree 04/11/2018  . Pressure injury of skin 04/06/2018  . Altered mental status 04/04/2018  . Hypothermia 04/04/2018  . Hemodialysis graft malfunction ([place]) 03/26/2018  . Hypercholesterolemia 02/27/2018  . Diabetes ([place]) 02/27/2018  . Weakness of right lower extremity 01/20/2018  . Fever   . Periumbilical abdominal pain   . Confusion 12/22/2017  . Acute delirium 12/21/2017  . Protein-calorie malnutrition, severe 12/19/2017  . Intractable nausea and vomiting 12/18/2017  . Lymphedema 12/13/2017  . Cellulitis 11/27/2017  . Chest pain 11/19/2017  . Atherosclerosis of native arteries of the extremities with ulceration ([place]) 11/07/2017  . Twitching 01/03/2017  . Elevated troponin 10/02/2015  . Complications, dialysis, catheter, mechanical ([place]) 10/02/2015  . Musculoskeletal chest pain 09/28/2015  . Chronic diastolic CHF (congestive heart failure) ([place]) 09/28/2015  . End stage renal disease ([place]) 10/09/2012  . Hypertension 10/09/2012  . Gout 10/09/2012   Phillips Grout PT, DPT, GCS  Koray Soter 08/27/2019, 12:01 PM  [place] MAIN [facility] SERVICES [address] [place], [place], [address] Phone: [phone]   Fax:  [phone]  Name: Johnny [name]. MRN: [id number] Date of Birth: [birth year]

## 2019-08-29 ENCOUNTER — Ambulatory Visit: Payer: Medicare Other

## 2019-08-29 ENCOUNTER — Other Ambulatory Visit: Payer: Self-pay

## 2019-08-29 DIAGNOSIS — R262 Difficulty in walking, not elsewhere classified: Secondary | ICD-10-CM

## 2019-08-29 DIAGNOSIS — R2681 Unsteadiness on feet: Secondary | ICD-10-CM

## 2019-08-29 NOTE — Therapy (Signed)
[place] MAIN [facility] SERVICES [address] [place], [place], [address] Phone: [phone]   Fax:  [phone]  Physical Therapy Progress Note   Dates of reporting period  07/18/19   to   08/29/19  Patient Details  Name: Johnny [name]. MRN: [id number] Date of Birth: [birth year] Referring Provider (PT): Eulogio Ditch, NP    Encounter Date: 08/29/2019  PT End of Session - 08/29/19 0820    Visit Number  20    Number of Visits  41    Date for PT Re-Evaluation  10/22/19    Authorization Type  [employer] Medicare    PT Start Time  0830    PT Stop Time  0915    PT Time Calculation (min)  45 min    Equipment Utilized During Treatment  Gait belt    Activity Tolerance  Patient tolerated treatment well;No increased pain    Behavior During Therapy  WFL for tasks assessed/performed       Past Medical History:  Diagnosis Date  . Anemia   . Anxiety   . CHF (congestive heart failure) ([place])   . Chronic kidney disease    esrd dialysis m/w/f  . Gout   . Hyperlipidemia   . Hypertension   . Myocardial infarction ([place]) 2010   10 years ago  . 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  . Peripheral vascular disease [facility])     Past Surgical History:  Procedure Laterality Date  . A/V FISTULAGRAM Right 09/06/2018   Procedure: A/V FISTULAGRAM;  Surgeon: Algernon Huxley, MD;  Location: [place] CV LAB;  Service: Cardiovascular;  Laterality: Right;  . A/V SHUNTOGRAM Left 06/21/2017   Procedure: A/V SHUNTOGRAM;  Surgeon: Katha Cabal, MD;  Location: [place] [facility];  Service: Cardiovascular;  Laterality: Left;  . A/V SHUNTOGRAM N/A 10/24/2018   Procedure: A/V SHUNTOGRAM;  Surgeon: Algernon Huxley, MD;  Location: [place] [facility] LAB;  Service: Cardiovascular;  Laterality: N/A;  . ABOVE KNEE LEG AMPUTATION Right 2020  . AMPUTATION Right 10/25/2018   Procedure: AMPUTATION ABOVE KNEE;  Surgeon: Algernon Huxley, MD;  Location: [facility]  ORS;  Service: General;  Laterality: Right;  . APPLICATION OF WOUND VAC Right 04/11/2018   Procedure: APPLICATION OF WOUND VAC;  Surgeon: Algernon Huxley, MD;  Location: [facility] ORS;  Service: Vascular;  Laterality: Right;  . AV FISTULA PLACEMENT Left 09/18/2015   Procedure: INSERTION OF ARTERIOVENOUS (AV) GORE-TEX GRAFT ARM ( BRACH/AXILLARY GRAFT W/ INSTANT STICK GRAFT );  Surgeon: Katha Cabal, MD;  Location: [facility] ORS;  Service: Vascular;  Laterality: Left;  . AV FISTULA PLACEMENT Right 07/19/2018   Procedure: INSERTION OF ARTERIOVENOUS (AV) GORE-TEX GRAFT ARM ( BRACHIAL AXILLARY);  Surgeon: Algernon Huxley, MD;  Location: [facility] ORS;  Service: Vascular;  Laterality: Right;  . DIALYSIS FISTULA CREATION Right 10/2017   right chest perm cath  . DIALYSIS/PERMA CATHETER REMOVAL N/A 09/13/2018   Procedure: DIALYSIS/PERMA CATHETER REMOVAL;  Surgeon: Algernon Huxley, MD;  Location: [place] [facility] LAB;  Service: Cardiovascular;  Laterality: N/A;  . ESOPHAGOGASTRODUODENOSCOPY N/A 12/19/2017   Procedure: ESOPHAGOGASTRODUODENOSCOPY (EGD);  Surgeon: Lin Landsman, MD;  Location: [facility] ENDOSCOPY;  Service: Gastroenterology;  Laterality: N/A;  . LOWER EXTREMITY ANGIOGRAPHY Left 11/16/2017   Procedure: LOWER EXTREMITY ANGIOGRAPHY;  Surgeon: Algernon Huxley, MD;  Location: [place] CV LAB;  Service: Cardiovascular;  Laterality: Left;  . LOWER EXTREMITY ANGIOGRAPHY Right 01/18/2018  Procedure: LOWER EXTREMITY ANGIOGRAPHY;  Surgeon: Algernon Huxley, MD;  Location: [place] [facility] LAB;  Service: Cardiovascular;  Laterality: Right;  . LOWER EXTREMITY ANGIOGRAPHY Left 04/02/2018   Procedure: LOWER EXTREMITY ANGIOGRAPHY;  Surgeon: Algernon Huxley, MD;  Location: [place] [facility] LAB;  Service: Cardiovascular;  Laterality: Left;  . LOWER EXTREMITY ANGIOGRAPHY Right 04/09/2018   Procedure: Lower Extremity Angiography with possible intervention;  Surgeon: Algernon Huxley, MD;  Location: [place] CV LAB;  Service: Cardiovascular;   Laterality: Right;  . LOWER EXTREMITY ANGIOGRAPHY Right 07/23/2018   Procedure: Lower Extremity Angiography;  Surgeon: Algernon Huxley, MD;  Location: [place] [facility] LAB;  Service: Cardiovascular;  Laterality: Right;  . LOWER EXTREMITY ANGIOGRAPHY Right 09/13/2018   Procedure: LOWER EXTREMITY ANGIOGRAPHY;  Surgeon: Algernon Huxley, MD;  Location: [place] [facility] LAB;  Service: Cardiovascular;  Laterality: Right;  . LOWER EXTREMITY VENOGRAPHY Right 09/13/2018   Procedure: LOWER EXTREMITY VENOGRAPHY;  Surgeon: Algernon Huxley, MD;  Location: [place] CV LAB;  Service: Cardiovascular;  Laterality: Right;  . PERIPHERAL VASCULAR CATHETERIZATION Left 09/01/2015   Procedure: A/V Shuntogram/Fistulagram;  Surgeon: Katha Cabal, MD;  Location: [place] [facility] LAB;  Service: Cardiovascular;  Laterality: Left;  . PERIPHERAL VASCULAR CATHETERIZATION N/A 09/30/2015   Procedure: A/V Shuntogram/Fistulagram with perm cathether removal;  Surgeon: Algernon Huxley, MD;  Location: [place] CV LAB;  Service: Cardiovascular;  Laterality: N/A;  . PERIPHERAL VASCULAR CATHETERIZATION Left 09/30/2015   Procedure: A/V Shunt Intervention;  Surgeon: Algernon Huxley, MD;  Location: [place] CV LAB;  Service: Cardiovascular;  Laterality: Left;  . PERIPHERAL VASCULAR CATHETERIZATION Left 12/03/2015   Procedure: Thrombectomy;  Surgeon: Algernon Huxley, MD;  Location: [place] [facility] LAB;  Service: Cardiovascular;  Laterality: Left;  . PERIPHERAL VASCULAR CATHETERIZATION Left 01/28/2016   Procedure: Thrombectomy;  Surgeon: Algernon Huxley, MD;  Location: [place] CV LAB;  Service: Cardiovascular;  Laterality: Left;  . PERIPHERAL VASCULAR CATHETERIZATION N/A 01/28/2016   Procedure: A/V Shuntogram/Fistulagram;  Surgeon: Algernon Huxley, MD;  Location: [place] [facility] LAB;  Service: Cardiovascular;  Laterality: N/A;  . SKIN SPLIT GRAFT Right 05/24/2018   Procedure: SKIN GRAFT SPLIT THICKNESS ( RIGHT CALF);  Surgeon: Algernon Huxley, MD;   Location: [facility] ORS;  Service: Vascular;  Laterality: Right;  . UPPER EXTREMITY ANGIOGRAPHY  10/24/2018   Procedure: Upper Extremity Angiography;  Surgeon: Algernon Huxley, MD;  Location: [place] [facility] LAB;  Service: Cardiovascular;;  . UPPER EXTREMITY ANGIOGRAPHY Right 02/14/2019   Procedure: UPPER EXTREMITY ANGIOGRAPHY;  Surgeon: Algernon Huxley, MD;  Location: [place] [facility];  Service: Cardiovascular;  Laterality: Right;  . WOUND DEBRIDEMENT Right 04/11/2018   Procedure: DEBRIDEMENT WOUND calf muscle and skin;  Surgeon: Algernon Huxley, MD;  Location: [facility] ORS;  Service: Vascular;  Laterality: Right;      There were no vitals filed for this visit.  Subjective Assessment - 08/29/19 0818    Subjective  Patient reports that he is doing well today, has been doing his exercises at home. He has had multiple stumbles while walking at home with assist but no falls. He denies any pain at rest upon arrival today. No specific questions or concerns currently.    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 Pt DC hospital to SNF for rehab. He has since been back at home. Pt uses a manual WC or power chair for daily mobility.  Pt takes tranportation services to HD MWF. Pt was recently fitted for his AKA prosthesis in Jan 2021.    Limitations  Standing;Walking;House hold activities    How long can you sit comfortably?  No difficulty    How long can you stand comfortably?  1 minute    How long can you walk comfortably?  unable    Currently in Pain?  No/denies          TREATMENT   Ther-ex Initial sit to stand from elevated mat table in order for therapist to assist in seated RLE farther down into socket but when attempted to pull down socket the strap broke off the gel liner;  Updated outcome measures and goals with patient: FOTO: 36 (initial evaluation 48), pt struggles to proper interpret questions even with assist from  therapist; Pt performs sit to stand x 2 with therapist with minA+1 from regular height chair with BUE assist and RW; Measurements obtained of R hip with pt lacking 5 degrees of extension before reaching neutral and having approximately 15 degrees of PROM abduction;  RLE prosthesis doffed for all supine exercises; Supine R hip flexion with manual resistance from therapist 2 x 10; L sidelying R hip abduction with manual resistance from therapist 2 x 10; L sidelying R hip extension with manual resistance from therapist 2 x 10; Prone positioning x 5 minutes with education about how to perform properly at home, rolled up towel placed under distal part of residual RLE for stretch;   Pt educated throughout session about proper posture and technique with exercises. Improved exercise technique, movement at target joints, use of target muscles after min to mod verbal, visual, tactile cues.   Ptis making excellent progress with therapy and continues to demonstrate excellent motivation. Unfortunately the adjustment strap on his gel liner broke today when trying to adjust his prosthesis so he is unable to ambulate today. However he has progressed with therapy from being non-ambulatory to now being able to ambulate over 100' in a session broken up into 2 or 3 bouts. He continues to lack R hip extension and abduction ROM and pt provided encouragement today to continue with prone positioning for stretching at home. His FOTO score today decreased however it was clear that pt was struggles to appropriately understand and answer the questions.Pt encouraged to continue HEP and follow-up as scheduled. Hewill benefit from PT services to address deficits in strength, balance, and mobility in order to return to full function at home.                        PT Short Term Goals - 08/29/19 9622      PT SHORT TERM GOAL #1   Title  Pt to demonstrate ability to perfrom STS transfer from chair c  RW minGuard assist and lock his RLE knee joint without assist.    Baseline  ModA from elevated surface at eval; 08/29/19: minA+1 with BUE support from regular height chair with arm rests   Now able to perform with supervision from chair +2 airexpads.   Time  4    Period  Weeks    Status  Partially Met    Target Date  06/25/19      PT SHORT TERM GOAL #2   Title  Pt to demonstrate 5 degrees hip flexion 10 degrees hip abdct ROM.    Baseline  lacks 5 degrees from neutral in both at eval; At visit 10, has 0 degrees  flexion/extension; still lacking 5 degrees ABDCT neutral; 08/29/19: R hip: lacking 5 degrees of extension, 15 degrees abduction    Time  4    Period  Weeks    Status  Achieved    Target Date  06/25/19        PT Long Term Goals - 08/29/19 0843      PT LONG TERM GOAL #1   Title  Pt to demonstrates 15 degrees Rt hip ABDCT, 10 degrees Rt hip extension P/ROM to facilitate prosthesis motor control.    Baseline  08/29/19: R hip: lacking 5 degrees of extension, 15 degrees abduction    Time  8    Period  Weeks    Status  Partially Met    Target Date  10/22/19      PT LONG TERM GOAL #2   Title  Pt to demonstrate 5/5 strength hip extension at 0 degrees flexion/extension    Baseline  has 5/5 in some ranges, but not in ranges specific to standing or gait.; 08/29/19: Unable to get to neutral flexion extension but 5/5 R hip extension in other ranges    Time  8    Period  Weeks    Status  On-going    Target Date  10/22/19      PT LONG TERM GOAL #3   Title  Pt able to perfrom 5x STS from chair with RW, arms ad lib in <21sec    Baseline  At visit 10 requires chair +2 pads +RW; 08/29/19: minA+1 with BUE support from regular height chair with arm rests to stand, unable to perform 5TSTS    Time  8    Period  Weeks    Status  Partially Met    Target Date  10/22/19      PT LONG TERM GOAL #4   Title  Pt will be able to ambulate at least 100' with RW and supervision with proper locking of R  prosthesis in order to demonstrate safe household ambulation in order to improve independence with gait without need for power wheelchair    Baseline  08/29/19: Pt has ambulated 50' with CGA and wheelchair follow with RW but episodes of buckling;    Time  12    Period  Weeks    Status  New    Target Date  10/22/19            Plan - 08/29/19 0820    Clinical Impression Statement  Pt is making excellent progress with therapy and continues to demonstrate excellent motivation. Unfortunately the adjustment strap on his gel liner broke today when trying to adjust his prosthesis so he is unable to ambulate today. However he has progressed with therapy from being non-ambulatory to now being able to ambulate over 100' in a session broken up into 2 or 3 bouts. He continues to lack R hip extension and abduction ROM and pt provided encouragement today to continue with prone positioning for stretching at home. His FOTO score today decreased however it was clear that pt was struggles to appropriately understand and answer the questions. Pt encouraged to continue HEP and follow-up as scheduled. He will benefit from PT services to address deficits in strength, balance, and mobility in order to return to full function at home.    Personal Factors and Comorbidities  Age;Fitness;Past/Current Experience;Education;Transportation    Examination-Activity Limitations  Transfers;Dressing;Squat;Stairs;Stand;Bed Mobility    Examination-Participation Restrictions  Meal Prep;Cleaning;Community Activity;Driving;Yard Work;Laundry;Shop    Stability/Clinical Decision Making  Stable/Uncomplicated  Rehab Potential  Good    PT Frequency  2x / week    PT Duration  12 weeks    PT Treatment/Interventions  ADLs/Self Care Home Management;Cryotherapy;Electrical Stimulation;DME Instruction;Gait training;Stair training;Functional mobility training;Therapeutic activities;Therapeutic exercise;Balance training;Neuromuscular  re-education;Cognitive remediation;Patient/family education;Prosthetic Training;Wheelchair mobility training;Passive range of motion;Dry needling;Scar mobilization;Spinal Manipulations;Joint Manipulations    PT Next Visit Plan  Strength, balance, transfers, and gait training with CGA and wheelchair follow    PT Home Exercise Plan  Medbridge Access Code: [email or web address] (prone positioning), L single leg bridges, L sidelying R hip abduction, hip isometric adduction    Consulted and Agree with Plan of Care  Patient       Patient will benefit from skilled therapeutic intervention in order to improve the following deficits and impairments:  Decreased activity tolerance, Decreased balance, Decreased mobility, Decreased strength, Decreased cognition, Decreased knowledge of use of DME, Decreased knowledge of precautions, Decreased endurance, Decreased range of motion, Difficulty walking, Decreased safety awareness, Decreased skin integrity, Decreased scar mobility, Hypomobility, Postural dysfunction, Increased edema, Increased muscle spasms, Increased fascial restricitons, Prosthetic Dependency, Impaired tone, Improper body mechanics  Visit Diagnosis: Difficulty in walking, not elsewhere classified  Unsteadiness on feet     Problem List Patient Active Problem List   Diagnosis Date Noted  . Cardiomyopathy ([place]) 01/22/2019  . CHF (congestive heart failure) ([place]) 01/22/2019  . Coronary disease 01/22/2019  . Above knee amputation of right lower extremity ([place]) 11/13/2018  . Leg ulcer, right, with fat layer exposed ([place]) 10/22/2018  . Cellulitis of right leg 07/21/2018  . Lower limb ulcer, calf, right, limited to breakdown of skin ([place]) 06/08/2018  . PVD (peripheral vascular disease) ([place]) 04/17/2018  . Malnutrition of moderate degree 04/11/2018  . Pressure injury of skin 04/06/2018  . Altered mental status 04/04/2018  . Hypothermia 04/04/2018  . Hemodialysis graft malfunction ([place]) 03/26/2018  .  Hypercholesterolemia 02/27/2018  . Diabetes ([place]) 02/27/2018  . Weakness of right lower extremity 01/20/2018  . Fever   . Periumbilical abdominal pain   . Confusion 12/22/2017  . Acute delirium 12/21/2017  . Protein-calorie malnutrition, severe 12/19/2017  . Intractable nausea and vomiting 12/18/2017  . Lymphedema 12/13/2017  . Cellulitis 11/27/2017  . Chest pain 11/19/2017  . Atherosclerosis of native arteries of the extremities with ulceration ([place]) 11/07/2017  . Twitching 01/03/2017  . Elevated troponin 10/02/2015  . Complications, dialysis, catheter, mechanical ([place]) 10/02/2015  . Musculoskeletal chest pain 09/28/2015  . Chronic diastolic CHF (congestive heart failure) ([place]) 09/28/2015  . End stage renal disease ([place]) 10/09/2012  . Hypertension 10/09/2012  . Gout 10/09/2012   Phillips Grout PT, DPT, GCS  Sunita Demond 08/29/2019, 3:00 PM  [place] MAIN [facility] SERVICES [address] [place], [place], [address] Phone: [phone]   Fax:  [phone]  Name: Johnny [name]. MRN: [id number] Date of Birth: 1950-07-02

## 2019-09-03 ENCOUNTER — Ambulatory Visit: Payer: Medicare Other

## 2019-09-03 ENCOUNTER — Other Ambulatory Visit: Payer: Self-pay

## 2019-09-03 DIAGNOSIS — R262 Difficulty in walking, not elsewhere classified: Secondary | ICD-10-CM | POA: Diagnosis not present

## 2019-09-03 DIAGNOSIS — R2681 Unsteadiness on feet: Secondary | ICD-10-CM

## 2019-09-03 NOTE — Therapy (Signed)
[place] MAIN [facility] SERVICES [address] [place], [place], [address] Phone: [phone]   Fax:  [phone]  Physical Therapy Treatment  Patient Details  Name: Johnny [name]. MRN: [id number] Date of Birth: [birth year] Referring Provider (PT): Eulogio Ditch, NP    Encounter Date: 09/03/2019  PT End of Session - 09/03/19 1018    Visit Number  21    Number of Visits  41    Date for PT Re-Evaluation  10/22/19    Authorization Type  [employer] Medicare    PT Start Time  0930    PT Stop Time  1012    PT Time Calculation (min)  42 min    Equipment Utilized During Treatment  Gait belt    Activity Tolerance  Patient tolerated treatment well;No increased pain    Behavior During Therapy  WFL for tasks assessed/performed       Past Medical History:  Diagnosis Date  . Anemia   . Anxiety   . CHF (congestive heart failure) ([place])   . Chronic kidney disease    esrd dialysis m/w/f  . Gout   . Hyperlipidemia   . Hypertension   . Myocardial infarction ([place]) 2010   10 years ago  . 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  . Peripheral vascular disease [facility])     Past Surgical History:  Procedure Laterality Date  . A/V FISTULAGRAM Right 09/06/2018   Procedure: A/V FISTULAGRAM;  Surgeon: Algernon Huxley, MD;  Location: [place] CV LAB;  Service: Cardiovascular;  Laterality: Right;  . A/V SHUNTOGRAM Left 06/21/2017   Procedure: A/V SHUNTOGRAM;  Surgeon: Katha Cabal, MD;  Location: [place] CV LAB;  Service: Cardiovascular;  Laterality: Left;  . A/V SHUNTOGRAM N/A 10/24/2018   Procedure: A/V SHUNTOGRAM;  Surgeon: Algernon Huxley, MD;  Location: [place] CV LAB;  Service: Cardiovascular;  Laterality: N/A;  . ABOVE KNEE LEG AMPUTATION Right 2020  . AMPUTATION Right 10/25/2018   Procedure: AMPUTATION ABOVE KNEE;  Surgeon: Algernon Huxley, MD;  Location: [facility] ORS;  Service: General;  Laterality: Right;  .  APPLICATION OF WOUND VAC Right 04/11/2018   Procedure: APPLICATION OF WOUND VAC;  Surgeon: Algernon Huxley, MD;  Location: [facility] ORS;  Service: Vascular;  Laterality: Right;  . AV FISTULA PLACEMENT Left 09/18/2015   Procedure: INSERTION OF ARTERIOVENOUS (AV) GORE-TEX GRAFT ARM ( BRACH/AXILLARY GRAFT W/ INSTANT STICK GRAFT );  Surgeon: Katha Cabal, MD;  Location: [facility] ORS;  Service: Vascular;  Laterality: Left;  . AV FISTULA PLACEMENT Right 07/19/2018   Procedure: INSERTION OF ARTERIOVENOUS (AV) GORE-TEX GRAFT ARM ( BRACHIAL AXILLARY);  Surgeon: Algernon Huxley, MD;  Location: [facility] ORS;  Service: Vascular;  Laterality: Right;  . DIALYSIS FISTULA CREATION Right 10/2017   right chest perm cath  . DIALYSIS/PERMA CATHETER REMOVAL N/A 09/13/2018   Procedure: DIALYSIS/PERMA CATHETER REMOVAL;  Surgeon: Algernon Huxley, MD;  Location: [place] CV LAB;  Service: Cardiovascular;  Laterality: N/A;  . ESOPHAGOGASTRODUODENOSCOPY N/A 12/19/2017   Procedure: ESOPHAGOGASTRODUODENOSCOPY (EGD);  Surgeon: Lin Landsman, MD;  Location: [facility] ENDOSCOPY;  Service: Gastroenterology;  Laterality: N/A;  . LOWER EXTREMITY ANGIOGRAPHY Left 11/16/2017   Procedure: LOWER EXTREMITY ANGIOGRAPHY;  Surgeon: Algernon Huxley, MD;  Location: [place] CV LAB;  Service: Cardiovascular;  Laterality: Left;  . LOWER EXTREMITY ANGIOGRAPHY Right 01/18/2018   Procedure: LOWER EXTREMITY ANGIOGRAPHY;  Surgeon: Algernon Huxley, MD;  Location: [place] INVASIVE CV  LAB;  Service: Cardiovascular;  Laterality: Right;  . LOWER EXTREMITY ANGIOGRAPHY Left 04/02/2018   Procedure: LOWER EXTREMITY ANGIOGRAPHY;  Surgeon: Algernon Huxley, MD;  Location: [place] CV LAB;  Service: Cardiovascular;  Laterality: Left;  . LOWER EXTREMITY ANGIOGRAPHY Right 04/09/2018   Procedure: Lower Extremity Angiography with possible intervention;  Surgeon: Algernon Huxley, MD;  Location: [place] CV LAB;  Service: Cardiovascular;  Laterality: Right;  . LOWER EXTREMITY  ANGIOGRAPHY Right 07/23/2018   Procedure: Lower Extremity Angiography;  Surgeon: Algernon Huxley, MD;  Location: [place] CV LAB;  Service: Cardiovascular;  Laterality: Right;  . LOWER EXTREMITY ANGIOGRAPHY Right 09/13/2018   Procedure: LOWER EXTREMITY ANGIOGRAPHY;  Surgeon: Algernon Huxley, MD;  Location: [place] CV LAB;  Service: Cardiovascular;  Laterality: Right;  . LOWER EXTREMITY VENOGRAPHY Right 09/13/2018   Procedure: LOWER EXTREMITY VENOGRAPHY;  Surgeon: Algernon Huxley, MD;  Location: [place] CV LAB;  Service: Cardiovascular;  Laterality: Right;  . PERIPHERAL VASCULAR CATHETERIZATION Left 09/01/2015   Procedure: A/V Shuntogram/Fistulagram;  Surgeon: Katha Cabal, MD;  Location: [place] CV LAB;  Service: Cardiovascular;  Laterality: Left;  . PERIPHERAL VASCULAR CATHETERIZATION N/A 09/30/2015   Procedure: A/V Shuntogram/Fistulagram with perm cathether removal;  Surgeon: Algernon Huxley, MD;  Location: [place] CV LAB;  Service: Cardiovascular;  Laterality: N/A;  . PERIPHERAL VASCULAR CATHETERIZATION Left 09/30/2015   Procedure: A/V Shunt Intervention;  Surgeon: Algernon Huxley, MD;  Location: [place] CV LAB;  Service: Cardiovascular;  Laterality: Left;  . PERIPHERAL VASCULAR CATHETERIZATION Left 12/03/2015   Procedure: Thrombectomy;  Surgeon: Algernon Huxley, MD;  Location: [place] CV LAB;  Service: Cardiovascular;  Laterality: Left;  . PERIPHERAL VASCULAR CATHETERIZATION Left 01/28/2016   Procedure: Thrombectomy;  Surgeon: Algernon Huxley, MD;  Location: [place] CV LAB;  Service: Cardiovascular;  Laterality: Left;  . PERIPHERAL VASCULAR CATHETERIZATION N/A 01/28/2016   Procedure: A/V Shuntogram/Fistulagram;  Surgeon: Algernon Huxley, MD;  Location: [place] CV LAB;  Service: Cardiovascular;  Laterality: N/A;  . SKIN SPLIT GRAFT Right 05/24/2018   Procedure: SKIN GRAFT SPLIT THICKNESS ( RIGHT CALF);  Surgeon: Algernon Huxley, MD;  Location: [facility] ORS;  Service: Vascular;   Laterality: Right;  . UPPER EXTREMITY ANGIOGRAPHY  10/24/2018   Procedure: Upper Extremity Angiography;  Surgeon: Algernon Huxley, MD;  Location: [place] CV LAB;  Service: Cardiovascular;;  . UPPER EXTREMITY ANGIOGRAPHY Right 02/14/2019   Procedure: UPPER EXTREMITY ANGIOGRAPHY;  Surgeon: Algernon Huxley, MD;  Location: [place] [facility] LAB;  Service: Cardiovascular;  Laterality: Right;  . WOUND DEBRIDEMENT Right 04/11/2018   Procedure: DEBRIDEMENT WOUND calf muscle and skin;  Surgeon: Algernon Huxley, MD;  Location: [facility] ORS;  Service: Vascular;  Laterality: Right;    There were no vitals filed for this visit.  Subjective Assessment - 09/03/19 1018    Subjective  Patient reports that he is doing well today, has been doing his exercises at home. He reports no stumbles or falls since last therapy session. He denies any pain at rest upon arrival today. No specific questions or concerns currently.    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 Pt DC hospital to SNF for rehab. He has since been back at home. Pt uses a manual WC or power chair for daily mobility. Pt takes tranportation services to HD MWF. Pt was recently fitted for his AKA prosthesis in Jan 2021.  Limitations  Standing;Walking;House hold activities    How long can you sit comfortably?  No difficulty    How long can you stand comfortably?  1 minute    How long can you walk comfortably?  unable    Currently in Pain?  No/denies          TREATMENT   Ther-ex Initial sit to stand from elevated mat table in order for therapist to assist in seated RLE farther down into socket Seated marchesx 20bilateral; Seated clams with manual resistancex 20bilateral; Seated adductor squeeze with manual resistancex 20 bilateral;   Gait Training Performed gait training with patientusing rolling walker.Education with patient regarding gait sequencing including advancing  walker,unlocking R knee, utilizing momentum to create heel strike on the right, and to lock out right knee. Then shift weight onto RLE and step with LLE utilizing step-to pattern. Therapist uses teach back method with patient today to reinforce sequencing. He struggles to instruct therapist in the steps for gait. Pt requiresless cues today compared to last session. He does not have any buckles today but does require cues a couple times to lock R knee before shifting weight. CGAand wheelchair followtoday by PT tech Pt requires cues for upright postureas well as control step length/walker distance from body.He is able to ambulate significant farther today and completes 57' followed by 100.' He continues to demonstrateheavy BUE support on walker handles during gait however therapist provided verbal cues to minimize UE support on walker. Fatigue monitored by therapist throughout.   Pt educated throughout session about proper posture and technique with exercises. Improved exercise technique, movement at target joints, use of target muscles after min to mod verbal, visual, tactile cues.   Ptis making excellent progress with therapy and continues to demonstrate excellent motivation. He is able to complete all of his seated exercises with therapy today but most of session today focused on gait training. Performed gait training with patientusing rolling walker.Education with patient regarding gait sequencing including advancing walker,unlocking R knee, utilizing momentum to create heel strike on the right, and to lock out right knee. Then shift weight onto RLE and step with LLE utilizing step-to pattern. Therapist uses teach back method with patient today to reinforce sequencing. He struggles to instruct therapist in the steps for gait. Pt requiresless cues today compared to last session. He does not have any buckles today but does require cues a couple times to lock R knee before shifting weight.  CGAand wheelchair followtoday by PT tech Pt requires cues for upright postureas well as control step length/walker distance from body.He is able to ambulate significant farther today and completes 73' followed by 100.' He continues to demonstrateheavy BUE support on walker handles during gait however therapist provided verbal cues to minimize UE support on walker. Fatigue monitored by therapist throughout.Pt advised that he can try some ambulation at home with CGA by friends/family with wheelchair follow.Pt encouraged to continue HEP. Hewill benefit from PT services to address deficits in strength, balance, and mobility in order to return to full function at home.                       PT Short Term Goals - 08/29/19 4010      PT SHORT TERM GOAL #1   Title  Pt to demonstrate ability to perfrom STS transfer from chair c RW minGuard assist and lock his RLE knee joint without assist.    Baseline  ModA from elevated surface at eval; 08/29/19:  minA+1 with BUE support from regular height chair with arm rests   Now able to perform with supervision from chair +2 airexpads.   Time  4    Period  Weeks    Status  Partially Met    Target Date  06/25/19      PT SHORT TERM GOAL #2   Title  Pt to demonstrate 5 degrees hip flexion 10 degrees hip abdct ROM.    Baseline  lacks 5 degrees from neutral in both at eval; At visit 10, has 0 degrees flexion/extension; still lacking 5 degrees ABDCT neutral; 08/29/19: R hip: lacking 5 degrees of extension, 15 degrees abduction    Time  4    Period  Weeks    Status  Achieved    Target Date  06/25/19        PT Long Term Goals - 08/29/19 0843      PT LONG TERM GOAL #1   Title  Pt to demonstrates 15 degrees Rt hip ABDCT, 10 degrees Rt hip extension P/ROM to facilitate prosthesis motor control.    Baseline  08/29/19: R hip: lacking 5 degrees of extension, 15 degrees abduction    Time  8    Period  Weeks    Status  Partially Met    Target  Date  10/22/19      PT LONG TERM GOAL #2   Title  Pt to demonstrate 5/5 strength hip extension at 0 degrees flexion/extension    Baseline  has 5/5 in some ranges, but not in ranges specific to standing or gait.; 08/29/19: Unable to get to neutral flexion extension but 5/5 R hip extension in other ranges    Time  8    Period  Weeks    Status  On-going    Target Date  10/22/19      PT LONG TERM GOAL #3   Title  Pt able to perfrom 5x STS from chair with RW, arms ad lib in <21sec    Baseline  At visit 10 requires chair +2 pads +RW; 08/29/19: minA+1 with BUE support from regular height chair with arm rests to stand, unable to perform 5TSTS    Time  8    Period  Weeks    Status  Partially Met    Target Date  10/22/19      PT LONG TERM GOAL #4   Title  Pt will be able to ambulate at least 100' with RW and supervision with proper locking of R prosthesis in order to demonstrate safe household ambulation in order to improve independence with gait without need for power wheelchair    Baseline  08/29/19: Pt has ambulated 48' with CGA and wheelchair follow with RW but episodes of buckling;    Time  12    Period  Weeks    Status  New    Target Date  10/22/19            Plan - 09/03/19 1019    Clinical Impression Statement  Pt is making excellent progress with therapy and continues to demonstrate excellent motivation. He is able to complete all of his seated exercises with therapy today but most of session today focused on gait training.  Performed gait training with patient using rolling walker. Education with patient regarding gait sequencing including advancing walker, unlocking R knee, utilizing momentum to create heel strike on the right, and to lock out right knee. Then shift weight onto RLE and step with LLE utilizing step-to  pattern. Therapist uses teach back method with patient today to reinforce sequencing. He struggles to instruct therapist in the steps for gait. Pt requires less cues  today compared to last session. He does not have any buckles today but does require cues a couple times to lock R knee before shifting weight. CGA and wheelchair follow today by PT tech Pt requires cues for upright posture as well as control step length/walker distance from body. He is able to ambulate significant farther today and completes 68' followed by 100.' He continues to demonstrate heavy BUE support on walker handles during gait however therapist provided verbal cues to minimize UE support on walker. Fatigue monitored by therapist throughout.Pt advised that he can try some ambulation at home with CGA by friends/family with wheelchair follow. Pt encouraged to continue HEP. He will benefit from PT services to address deficits in strength, balance, and mobility in order to return to full function at home.    Personal Factors and Comorbidities  Age;Fitness;Past/Current Experience;Education;Transportation    Examination-Activity Limitations  Transfers;Dressing;Squat;Stairs;Stand;Bed Mobility    Examination-Participation Restrictions  Meal Prep;Cleaning;Community Activity;Driving;Yard Work;Laundry;Shop    Stability/Clinical Decision Making  Stable/Uncomplicated    Rehab Potential  Good    PT Frequency  2x / week    PT Duration  12 weeks    PT Treatment/Interventions  ADLs/Self Care Home Management;Cryotherapy;Electrical Stimulation;DME Instruction;Gait training;Stair training;Functional mobility training;Therapeutic activities;Therapeutic exercise;Balance training;Neuromuscular re-education;Cognitive remediation;Patient/family education;Prosthetic Training;Wheelchair mobility training;Passive range of motion;Dry needling;Scar mobilization;Spinal Manipulations;Joint Manipulations    PT Next Visit Plan  Strength, balance, transfers, and gait training with CGA and wheelchair follow    PT Home Exercise Plan  Medbridge Access Code: [email or web address] (prone positioning), L single leg bridges, L sidelying R hip  abduction, hip isometric adduction    Consulted and Agree with Plan of Care  Patient       Patient will benefit from skilled therapeutic intervention in order to improve the following deficits and impairments:  Decreased activity tolerance, Decreased balance, Decreased mobility, Decreased strength, Decreased cognition, Decreased knowledge of use of DME, Decreased knowledge of precautions, Decreased endurance, Decreased range of motion, Difficulty walking, Decreased safety awareness, Decreased skin integrity, Decreased scar mobility, Hypomobility, Postural dysfunction, Increased edema, Increased muscle spasms, Increased fascial restricitons, Prosthetic Dependency, Impaired tone, Improper body mechanics  Visit Diagnosis: Difficulty in walking, not elsewhere classified  Unsteadiness on feet     Problem List Patient Active Problem List   Diagnosis Date Noted  . Cardiomyopathy ([place]) 01/22/2019  . CHF (congestive heart failure) ([place]) 01/22/2019  . Coronary disease 01/22/2019  . Above knee amputation of right lower extremity ([place]) 11/13/2018  . Leg ulcer, right, with fat layer exposed ([place]) 10/22/2018  . Cellulitis of right leg 07/21/2018  . Lower limb ulcer, calf, right, limited to breakdown of skin ([place]) 06/08/2018  . PVD (peripheral vascular disease) ([place]) 04/17/2018  . Malnutrition of moderate degree 04/11/2018  . Pressure injury of skin 04/06/2018  . Altered mental status 04/04/2018  . Hypothermia 04/04/2018  . Hemodialysis graft malfunction ([place]) 03/26/2018  . Hypercholesterolemia 02/27/2018  . Diabetes ([place]) 02/27/2018  . Weakness of right lower extremity 01/20/2018  . Fever   . Periumbilical abdominal pain   . Confusion 12/22/2017  . Acute delirium 12/21/2017  . Protein-calorie malnutrition, severe 12/19/2017  . Intractable nausea and vomiting 12/18/2017  . Lymphedema 12/13/2017  . Cellulitis 11/27/2017  . Chest pain 11/19/2017  . Atherosclerosis of native arteries of the  extremities with ulceration ([place]) 11/07/2017  . Twitching 01/03/2017  .  Elevated troponin 10/02/2015  . Complications, dialysis, catheter, mechanical ([place]) 10/02/2015  . Musculoskeletal chest pain 09/28/2015  . Chronic diastolic CHF (congestive heart failure) ([place]) 09/28/2015  . End stage renal disease ([place]) 10/09/2012  . Hypertension 10/09/2012  . Gout 10/09/2012   Phillips Grout PT, DPT, GCS  Majesta Leichter 09/03/2019, 10:24 AM  [place] MAIN [facility] SERVICES [address] [place], [place], [address] Phone: [phone]   Fax:  [phone]  Name: Oreste Majeed. MRN: [id number] Date of Birth: [birth year]

## 2019-09-05 ENCOUNTER — Ambulatory Visit: Payer: Medicare Other

## 2019-09-05 ENCOUNTER — Other Ambulatory Visit: Payer: Self-pay

## 2019-09-05 DIAGNOSIS — R2681 Unsteadiness on feet: Secondary | ICD-10-CM

## 2019-09-05 DIAGNOSIS — R262 Difficulty in walking, not elsewhere classified: Secondary | ICD-10-CM

## 2019-09-05 NOTE — Therapy (Signed)
[place] MAIN [facility] SERVICES [address] [place], [place], [address] Phone: [phone]   Fax:  [phone]  Physical Therapy Treatment  Patient Details  Name: Johnny [name]. MRN: [id number] Date of Birth: [birth year] Referring Provider (PT): Eulogio Ditch, NP    Encounter Date: 09/05/2019  PT End of Session - 09/05/19 0838    Visit Number  22    Number of Visits  41    Date for PT Re-Evaluation  10/22/19    Authorization Type  [employer] Medicare    PT Start Time  0845    PT Stop Time  0930    PT Time Calculation (min)  45 min    Equipment Utilized During Treatment  Gait belt    Activity Tolerance  Patient tolerated treatment well;No increased pain    Behavior During Therapy  WFL for tasks assessed/performed       Past Medical History:  Diagnosis Date  . Anemia   . Anxiety   . CHF (congestive heart failure) ([place])   . Chronic kidney disease    esrd dialysis m/w/f  . Gout   . Hyperlipidemia   . Hypertension   . Myocardial infarction ([place]) 2010   10 years ago  . 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  . Peripheral vascular disease [facility])     Past Surgical History:  Procedure Laterality Date  . A/V FISTULAGRAM Right 09/06/2018   Procedure: A/V FISTULAGRAM;  Surgeon: Algernon Huxley, MD;  Location: [place] CV LAB;  Service: Cardiovascular;  Laterality: Right;  . A/V SHUNTOGRAM Left 06/21/2017   Procedure: A/V SHUNTOGRAM;  Surgeon: Katha Cabal, MD;  Location: [place] CV LAB;  Service: Cardiovascular;  Laterality: Left;  . A/V SHUNTOGRAM N/A 10/24/2018   Procedure: A/V SHUNTOGRAM;  Surgeon: Algernon Huxley, MD;  Location: [place] CV LAB;  Service: Cardiovascular;  Laterality: N/A;  . ABOVE KNEE LEG AMPUTATION Right 2020  . AMPUTATION Right 10/25/2018   Procedure: AMPUTATION ABOVE KNEE;  Surgeon: Algernon Huxley, MD;  Location: [facility] ORS;  Service: General;  Laterality: Right;  .  APPLICATION OF WOUND VAC Right 04/11/2018   Procedure: APPLICATION OF WOUND VAC;  Surgeon: Algernon Huxley, MD;  Location: [facility] ORS;  Service: Vascular;  Laterality: Right;  . AV FISTULA PLACEMENT Left 09/18/2015   Procedure: INSERTION OF ARTERIOVENOUS (AV) GORE-TEX GRAFT ARM ( BRACH/AXILLARY GRAFT W/ INSTANT STICK GRAFT );  Surgeon: Katha Cabal, MD;  Location: [facility] ORS;  Service: Vascular;  Laterality: Left;  . AV FISTULA PLACEMENT Right 07/19/2018   Procedure: INSERTION OF ARTERIOVENOUS (AV) GORE-TEX GRAFT ARM ( BRACHIAL AXILLARY);  Surgeon: Algernon Huxley, MD;  Location: [facility] ORS;  Service: Vascular;  Laterality: Right;  . DIALYSIS FISTULA CREATION Right 10/2017   right chest perm cath  . DIALYSIS/PERMA CATHETER REMOVAL N/A 09/13/2018   Procedure: DIALYSIS/PERMA CATHETER REMOVAL;  Surgeon: Algernon Huxley, MD;  Location: [place] CV LAB;  Service: Cardiovascular;  Laterality: N/A;  . ESOPHAGOGASTRODUODENOSCOPY N/A 12/19/2017   Procedure: ESOPHAGOGASTRODUODENOSCOPY (EGD);  Surgeon: Lin Landsman, MD;  Location: [facility] ENDOSCOPY;  Service: Gastroenterology;  Laterality: N/A;  . LOWER EXTREMITY ANGIOGRAPHY Left 11/16/2017   Procedure: LOWER EXTREMITY ANGIOGRAPHY;  Surgeon: Algernon Huxley, MD;  Location: [place] CV LAB;  Service: Cardiovascular;  Laterality: Left;  . LOWER EXTREMITY ANGIOGRAPHY Right 01/18/2018   Procedure: LOWER EXTREMITY ANGIOGRAPHY;  Surgeon: Algernon Huxley, MD;  Location: [place] INVASIVE CV  LAB;  Service: Cardiovascular;  Laterality: Right;  . LOWER EXTREMITY ANGIOGRAPHY Left 04/02/2018   Procedure: LOWER EXTREMITY ANGIOGRAPHY;  Surgeon: Algernon Huxley, MD;  Location: [place] CV LAB;  Service: Cardiovascular;  Laterality: Left;  . LOWER EXTREMITY ANGIOGRAPHY Right 04/09/2018   Procedure: Lower Extremity Angiography with possible intervention;  Surgeon: Algernon Huxley, MD;  Location: [place] CV LAB;  Service: Cardiovascular;  Laterality: Right;  . LOWER EXTREMITY  ANGIOGRAPHY Right 07/23/2018   Procedure: Lower Extremity Angiography;  Surgeon: Algernon Huxley, MD;  Location: [place] CV LAB;  Service: Cardiovascular;  Laterality: Right;  . LOWER EXTREMITY ANGIOGRAPHY Right 09/13/2018   Procedure: LOWER EXTREMITY ANGIOGRAPHY;  Surgeon: Algernon Huxley, MD;  Location: [place] CV LAB;  Service: Cardiovascular;  Laterality: Right;  . LOWER EXTREMITY VENOGRAPHY Right 09/13/2018   Procedure: LOWER EXTREMITY VENOGRAPHY;  Surgeon: Algernon Huxley, MD;  Location: [place] CV LAB;  Service: Cardiovascular;  Laterality: Right;  . PERIPHERAL VASCULAR CATHETERIZATION Left 09/01/2015   Procedure: A/V Shuntogram/Fistulagram;  Surgeon: Katha Cabal, MD;  Location: [place] CV LAB;  Service: Cardiovascular;  Laterality: Left;  . PERIPHERAL VASCULAR CATHETERIZATION N/A 09/30/2015   Procedure: A/V Shuntogram/Fistulagram with perm cathether removal;  Surgeon: Algernon Huxley, MD;  Location: [place] CV LAB;  Service: Cardiovascular;  Laterality: N/A;  . PERIPHERAL VASCULAR CATHETERIZATION Left 09/30/2015   Procedure: A/V Shunt Intervention;  Surgeon: Algernon Huxley, MD;  Location: [place] CV LAB;  Service: Cardiovascular;  Laterality: Left;  . PERIPHERAL VASCULAR CATHETERIZATION Left 12/03/2015   Procedure: Thrombectomy;  Surgeon: Algernon Huxley, MD;  Location: [place] CV LAB;  Service: Cardiovascular;  Laterality: Left;  . PERIPHERAL VASCULAR CATHETERIZATION Left 01/28/2016   Procedure: Thrombectomy;  Surgeon: Algernon Huxley, MD;  Location: [place] CV LAB;  Service: Cardiovascular;  Laterality: Left;  . PERIPHERAL VASCULAR CATHETERIZATION N/A 01/28/2016   Procedure: A/V Shuntogram/Fistulagram;  Surgeon: Algernon Huxley, MD;  Location: [place] CV LAB;  Service: Cardiovascular;  Laterality: N/A;  . SKIN SPLIT GRAFT Right 05/24/2018   Procedure: SKIN GRAFT SPLIT THICKNESS ( RIGHT CALF);  Surgeon: Algernon Huxley, MD;  Location: [facility] ORS;  Service: Vascular;   Laterality: Right;  . UPPER EXTREMITY ANGIOGRAPHY  10/24/2018   Procedure: Upper Extremity Angiography;  Surgeon: Algernon Huxley, MD;  Location: [place] CV LAB;  Service: Cardiovascular;;  . UPPER EXTREMITY ANGIOGRAPHY Right 02/14/2019   Procedure: UPPER EXTREMITY ANGIOGRAPHY;  Surgeon: Algernon Huxley, MD;  Location: [place] CV LAB;  Service: Cardiovascular;  Laterality: Right;  . WOUND DEBRIDEMENT Right 04/11/2018   Procedure: DEBRIDEMENT WOUND calf muscle and skin;  Surgeon: Algernon Huxley, MD;  Location: [facility] ORS;  Service: Vascular;  Laterality: Right;    There were no vitals filed for this visit.  Subjective Assessment - 09/05/19 0838    Subjective  Patient reports that he is doing well today, has been doing his exercises at home. He reports no stumbles or falls since last therapy session. He denies any pain at rest upon arrival today. No specific questions or concerns currently.    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 Pt DC hospital to SNF for rehab. He has since been back at home. Pt uses a manual WC or power chair for daily mobility. Pt takes tranportation services to HD MWF. Pt was recently fitted for his AKA prosthesis in Jan 2021.  Limitations  Standing;Walking;House hold activities    How long can you sit comfortably?  No difficulty    How long can you stand comfortably?  1 minute    How long can you walk comfortably?  unable    Currently in Pain?  No/denies           TREATMENT   Ther-ex Initial sit to stand from elevated mat table in order for therapist to assist in seated RLE farther down into socket Seated marchesx 20bilateral; Seated clams with manual resistancex 20bilateral; Seated adductor squeeze with manual resistancex 20 bilateral;   Gait Training Performed gait training with patientusing rolling walker.Education with patient regarding gait sequencing including advancing  walker,unlocking R knee, utilizing momentum to create heel strike on the right, and to lock out right knee. Then shift weight onto RLE and step with LLE utilizing step-to pattern. Pt requiresless cues today compared to last session. He does have one buckle where he fails to lock his R knee prior to stepping and he has one LOB requiring maxA+1 by therapist to correct and prevent him from falling. CGAand wheelchair followtoday by PT tech Pt requires cues for upright postureas well as control step length/walker distance from body.He is able to ambulate 50', 75', and 60.' He continues to demonstrateheavy BUE support on walker handles during gait however therapist provided verbal cues to minimize UE support on walker. Fatigue monitored by therapist throughout.   Pt educated throughout session about proper posture and technique with exercises. Improved exercise technique, movement at target joints, use of target muscles after min to mod verbal, visual, tactile cues.   Ptis making excellent progress with therapy and continues to demonstrate excellent motivation. He is able to complete all of his seated exercises with therapy today but most of session today focused on gait training. Performed gait training with patientusing rolling walker again today. Discussed possible use of single point cane in parallel bars next visit. Education with patient regarding gait sequencing including advancing walker,unlocking R knee, utilizing momentum to create heel strike on the right, and to lock out right knee. Then shift weight onto RLE and step with LLE utilizing step-to pattern. Pt requiresless cues today compared to last session. He does have one buckle where he fails to lock his R knee prior to stepping and he has one LOB requiring maxA+1 by therapist to correct and prevent him from falling. CGAand wheelchair followtoday by PT tech Pt requires cues for upright postureas well as control step length/walker  distance from body.He is able to ambulate 50', 75', and 60.' He continues to demonstrateheavy BUE support on walker handles during gait however therapist provided verbal cues to minimize UE support on walker. Fatigue monitored by therapist throughout.Pt encouraged to continue HEP. Hewill benefit from PT services to address deficits in strength, balance, and mobility in order to return to full function at home.                        PT Short Term Goals - 08/29/19 9326      PT SHORT TERM GOAL #1   Title  Pt to demonstrate ability to perfrom STS transfer from chair c RW minGuard assist and lock his RLE knee joint without assist.    Baseline  ModA from elevated surface at eval; 08/29/19: minA+1 with BUE support from regular height chair with arm rests   Now able to perform with supervision from chair +2 airexpads.   Time  4  Period  Weeks    Status  Partially Met    Target Date  06/25/19      PT SHORT TERM GOAL #2   Title  Pt to demonstrate 5 degrees hip flexion 10 degrees hip abdct ROM.    Baseline  lacks 5 degrees from neutral in both at eval; At visit 10, has 0 degrees flexion/extension; still lacking 5 degrees ABDCT neutral; 08/29/19: R hip: lacking 5 degrees of extension, 15 degrees abduction    Time  4    Period  Weeks    Status  Achieved    Target Date  06/25/19        PT Long Term Goals - 08/29/19 0843      PT LONG TERM GOAL #1   Title  Pt to demonstrates 15 degrees Rt hip ABDCT, 10 degrees Rt hip extension P/ROM to facilitate prosthesis motor control.    Baseline  08/29/19: R hip: lacking 5 degrees of extension, 15 degrees abduction    Time  8    Period  Weeks    Status  Partially Met    Target Date  10/22/19      PT LONG TERM GOAL #2   Title  Pt to demonstrate 5/5 strength hip extension at 0 degrees flexion/extension    Baseline  has 5/5 in some ranges, but not in ranges specific to standing or gait.; 08/29/19: Unable to get to neutral flexion  extension but 5/5 R hip extension in other ranges    Time  8    Period  Weeks    Status  On-going    Target Date  10/22/19      PT LONG TERM GOAL #3   Title  Pt able to perfrom 5x STS from chair with RW, arms ad lib in <21sec    Baseline  At visit 10 requires chair +2 pads +RW; 08/29/19: minA+1 with BUE support from regular height chair with arm rests to stand, unable to perform 5TSTS    Time  8    Period  Weeks    Status  Partially Met    Target Date  10/22/19      PT LONG TERM GOAL #4   Title  Pt will be able to ambulate at least 100' with RW and supervision with proper locking of R prosthesis in order to demonstrate safe household ambulation in order to improve independence with gait without need for power wheelchair    Baseline  08/29/19: Pt has ambulated 56' with CGA and wheelchair follow with RW but episodes of buckling;    Time  12    Period  Weeks    Status  New    Target Date  10/22/19            Plan - 09/05/19 0839    Clinical Impression Statement  Pt is making excellent progress with therapy and continues to demonstrate excellent motivation. He is able to complete all of his seated exercises with therapy today but most of session today focused on gait training.  Performed gait training with patient using rolling walker again today. Discussed possible use of single point cane in parallel bars next visit. Education with patient regarding gait sequencing including advancing walker, unlocking R knee, utilizing momentum to create heel strike on the right, and to lock out right knee. Then shift weight onto RLE and step with LLE utilizing step-to pattern. Pt requires less cues today compared to last session. He does have one buckle where he  fails to lock his R knee prior to stepping and he has one LOB requiring maxA+1 by therapist to correct and prevent him from falling. CGA and wheelchair follow today by PT tech Pt requires cues for upright posture as well as control step  length/walker distance from body. He is able to ambulate 50', 75', and 60.' He continues to demonstrate heavy BUE support on walker handles during gait however therapist provided verbal cues to minimize UE support on walker. Fatigue monitored by therapist throughout. Pt encouraged to continue HEP. He will benefit from PT services to address deficits in strength, balance, and mobility in order to return to full function at home.    Personal Factors and Comorbidities  Age;Fitness;Past/Current Experience;Education;Transportation    Examination-Activity Limitations  Transfers;Dressing;Squat;Stairs;Stand;Bed Mobility    Examination-Participation Restrictions  Meal Prep;Cleaning;Community Activity;Driving;Yard Work;Laundry;Shop    Stability/Clinical Decision Making  Stable/Uncomplicated    Rehab Potential  Good    PT Frequency  2x / week    PT Duration  12 weeks    PT Treatment/Interventions  ADLs/Self Care Home Management;Cryotherapy;Electrical Stimulation;DME Instruction;Gait training;Stair training;Functional mobility training;Therapeutic activities;Therapeutic exercise;Balance training;Neuromuscular re-education;Cognitive remediation;Patient/family education;Prosthetic Training;Wheelchair mobility training;Passive range of motion;Dry needling;Scar mobilization;Spinal Manipulations;Joint Manipulations    PT Next Visit Plan  Strength, balance, transfers, and gait training with CGA and wheelchair follow    PT Home Exercise Plan  Medbridge Access Code: [email or web address] (prone positioning), L single leg bridges, L sidelying R hip abduction, hip isometric adduction    Consulted and Agree with Plan of Care  Patient       Patient will benefit from skilled therapeutic intervention in order to improve the following deficits and impairments:  Decreased activity tolerance, Decreased balance, Decreased mobility, Decreased strength, Decreased cognition, Decreased knowledge of use of DME, Decreased knowledge of precautions,  Decreased endurance, Decreased range of motion, Difficulty walking, Decreased safety awareness, Decreased skin integrity, Decreased scar mobility, Hypomobility, Postural dysfunction, Increased edema, Increased muscle spasms, Increased fascial restricitons, Prosthetic Dependency, Impaired tone, Improper body mechanics  Visit Diagnosis: Difficulty in walking, not elsewhere classified  Unsteadiness on feet     Problem List Patient Active Problem List   Diagnosis Date Noted  . Cardiomyopathy ([place]) 01/22/2019  . CHF (congestive heart failure) ([place]) 01/22/2019  . Coronary disease 01/22/2019  . Above knee amputation of right lower extremity ([place]) 11/13/2018  . Leg ulcer, right, with fat layer exposed ([place]) 10/22/2018  . Cellulitis of right leg 07/21/2018  . Lower limb ulcer, calf, right, limited to breakdown of skin ([place]) 06/08/2018  . PVD (peripheral vascular disease) ([place]) 04/17/2018  . Malnutrition of moderate degree 04/11/2018  . Pressure injury of skin 04/06/2018  . Altered mental status 04/04/2018  . Hypothermia 04/04/2018  . Hemodialysis graft malfunction ([place]) 03/26/2018  . Hypercholesterolemia 02/27/2018  . Diabetes ([place]) 02/27/2018  . Weakness of right lower extremity 01/20/2018  . Fever   . Periumbilical abdominal pain   . Confusion 12/22/2017  . Acute delirium 12/21/2017  . Protein-calorie malnutrition, severe 12/19/2017  . Intractable nausea and vomiting 12/18/2017  . Lymphedema 12/13/2017  . Cellulitis 11/27/2017  . Chest pain 11/19/2017  . Atherosclerosis of native arteries of the extremities with ulceration ([place]) 11/07/2017  . Twitching 01/03/2017  . Elevated troponin 10/02/2015  . Complications, dialysis, catheter, mechanical ([place]) 10/02/2015  . Musculoskeletal chest pain 09/28/2015  . Chronic diastolic CHF (congestive heart failure) ([place]) 09/28/2015  . End stage renal disease ([place]) 10/09/2012  . Hypertension 10/09/2012  . Gout 10/09/2012   Lyndel Safe Brylee Berk  PT, DPT, GCS  Cathern Tahir 09/05/2019, 12:33 PM  [place] MAIN [facility] SERVICES [address] [place], [place], [address] Phone: [phone]   Fax:  [phone]  Name: Johnny [name]. MRN: [id number] Date of Birth: [birth year]

## 2019-09-10 ENCOUNTER — Ambulatory Visit: Payer: Medicare Other | Attending: Nurse Practitioner

## 2019-09-10 ENCOUNTER — Other Ambulatory Visit: Payer: Self-pay

## 2019-09-10 DIAGNOSIS — R2681 Unsteadiness on feet: Secondary | ICD-10-CM | POA: Diagnosis present

## 2019-09-10 DIAGNOSIS — R262 Difficulty in walking, not elsewhere classified: Secondary | ICD-10-CM | POA: Diagnosis not present

## 2019-09-10 NOTE — Therapy (Signed)
[place] MAIN [facility] SERVICES [address] [place], [place], [address] Phone: [phone]   Fax:  [phone]  Physical Therapy Treatment  Patient Details  Name: Johnny [name]. MRN: [id number] Date of Birth: 10/08/1950 Referring Provider (PT): Eulogio Ditch, NP    Encounter Date: 09/10/2019  PT End of Session - 09/10/19 0836    Visit Number  23    Number of Visits  41    Date for PT Re-Evaluation  10/22/19    Authorization Type  [employer] Medicare    PT Start Time  0842    PT Stop Time  0928    PT Time Calculation (min)  46 min    Equipment Utilized During Treatment  Gait belt    Activity Tolerance  Patient tolerated treatment well;No increased pain    Behavior During Therapy  WFL for tasks assessed/performed       Past Medical History:  Diagnosis Date  . Anemia   . Anxiety   . CHF (congestive heart failure) ([place])   . Chronic kidney disease    esrd dialysis m/w/f  . Gout   . Hyperlipidemia   . Hypertension   . Myocardial infarction ([place]) 2010   10 years ago  . 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  . Peripheral vascular disease [facility])     Past Surgical History:  Procedure Laterality Date  . A/V FISTULAGRAM Right 09/06/2018   Procedure: A/V FISTULAGRAM;  Surgeon: Algernon Huxley, MD;  Location: [place] CV LAB;  Service: Cardiovascular;  Laterality: Right;  . A/V SHUNTOGRAM Left 06/21/2017   Procedure: A/V SHUNTOGRAM;  Surgeon: Katha Cabal, MD;  Location: [place] CV LAB;  Service: Cardiovascular;  Laterality: Left;  . A/V SHUNTOGRAM N/A 10/24/2018   Procedure: A/V SHUNTOGRAM;  Surgeon: Algernon Huxley, MD;  Location: [place] CV LAB;  Service: Cardiovascular;  Laterality: N/A;  . ABOVE KNEE LEG AMPUTATION Right 2020  . AMPUTATION Right 10/25/2018   Procedure: AMPUTATION ABOVE KNEE;  Surgeon: Algernon Huxley, MD;  Location: [facility] ORS;  Service: General;  Laterality: Right;  . APPLICATION  OF WOUND VAC Right 04/11/2018   Procedure: APPLICATION OF WOUND VAC;  Surgeon: Algernon Huxley, MD;  Location: [facility] ORS;  Service: Vascular;  Laterality: Right;  . AV FISTULA PLACEMENT Left 09/18/2015   Procedure: INSERTION OF ARTERIOVENOUS (AV) GORE-TEX GRAFT ARM ( BRACH/AXILLARY GRAFT W/ INSTANT STICK GRAFT );  Surgeon: Katha Cabal, MD;  Location: [facility] ORS;  Service: Vascular;  Laterality: Left;  . AV FISTULA PLACEMENT Right 07/19/2018   Procedure: INSERTION OF ARTERIOVENOUS (AV) GORE-TEX GRAFT ARM ( BRACHIAL AXILLARY);  Surgeon: Algernon Huxley, MD;  Location: [facility] ORS;  Service: Vascular;  Laterality: Right;  . DIALYSIS FISTULA CREATION Right 10/2017   right chest perm cath  . DIALYSIS/PERMA CATHETER REMOVAL N/A 09/13/2018   Procedure: DIALYSIS/PERMA CATHETER REMOVAL;  Surgeon: Algernon Huxley, MD;  Location: [place] CV LAB;  Service: Cardiovascular;  Laterality: N/A;  . ESOPHAGOGASTRODUODENOSCOPY N/A 12/19/2017   Procedure: ESOPHAGOGASTRODUODENOSCOPY (EGD);  Surgeon: Lin Landsman, MD;  Location: [facility] ENDOSCOPY;  Service: Gastroenterology;  Laterality: N/A;  . LOWER EXTREMITY ANGIOGRAPHY Left 11/16/2017   Procedure: LOWER EXTREMITY ANGIOGRAPHY;  Surgeon: Algernon Huxley, MD;  Location: [place] CV LAB;  Service: Cardiovascular;  Laterality: Left;  . LOWER EXTREMITY ANGIOGRAPHY Right 01/18/2018   Procedure: LOWER EXTREMITY ANGIOGRAPHY;  Surgeon: Algernon Huxley, MD;  Location: [place] INVASIVE CV  LAB;  Service: Cardiovascular;  Laterality: Right;  . LOWER EXTREMITY ANGIOGRAPHY Left 04/02/2018   Procedure: LOWER EXTREMITY ANGIOGRAPHY;  Surgeon: Algernon Huxley, MD;  Location: [place] CV LAB;  Service: Cardiovascular;  Laterality: Left;  . LOWER EXTREMITY ANGIOGRAPHY Right 04/09/2018   Procedure: Lower Extremity Angiography with possible intervention;  Surgeon: Algernon Huxley, MD;  Location: [place] CV LAB;  Service: Cardiovascular;  Laterality: Right;  . LOWER EXTREMITY ANGIOGRAPHY Right  07/23/2018   Procedure: Lower Extremity Angiography;  Surgeon: Algernon Huxley, MD;  Location: [place] CV LAB;  Service: Cardiovascular;  Laterality: Right;  . LOWER EXTREMITY ANGIOGRAPHY Right 09/13/2018   Procedure: LOWER EXTREMITY ANGIOGRAPHY;  Surgeon: Algernon Huxley, MD;  Location: [place] CV LAB;  Service: Cardiovascular;  Laterality: Right;  . LOWER EXTREMITY VENOGRAPHY Right 09/13/2018   Procedure: LOWER EXTREMITY VENOGRAPHY;  Surgeon: Algernon Huxley, MD;  Location: [place] CV LAB;  Service: Cardiovascular;  Laterality: Right;  . PERIPHERAL VASCULAR CATHETERIZATION Left 09/01/2015   Procedure: A/V Shuntogram/Fistulagram;  Surgeon: Katha Cabal, MD;  Location: [place] CV LAB;  Service: Cardiovascular;  Laterality: Left;  . PERIPHERAL VASCULAR CATHETERIZATION N/A 09/30/2015   Procedure: A/V Shuntogram/Fistulagram with perm cathether removal;  Surgeon: Algernon Huxley, MD;  Location: [place] CV LAB;  Service: Cardiovascular;  Laterality: N/A;  . PERIPHERAL VASCULAR CATHETERIZATION Left 09/30/2015   Procedure: A/V Shunt Intervention;  Surgeon: Algernon Huxley, MD;  Location: [place] CV LAB;  Service: Cardiovascular;  Laterality: Left;  . PERIPHERAL VASCULAR CATHETERIZATION Left 12/03/2015   Procedure: Thrombectomy;  Surgeon: Algernon Huxley, MD;  Location: [place] CV LAB;  Service: Cardiovascular;  Laterality: Left;  . PERIPHERAL VASCULAR CATHETERIZATION Left 01/28/2016   Procedure: Thrombectomy;  Surgeon: Algernon Huxley, MD;  Location: [place] CV LAB;  Service: Cardiovascular;  Laterality: Left;  . PERIPHERAL VASCULAR CATHETERIZATION N/A 01/28/2016   Procedure: A/V Shuntogram/Fistulagram;  Surgeon: Algernon Huxley, MD;  Location: [place] CV LAB;  Service: Cardiovascular;  Laterality: N/A;  . SKIN SPLIT GRAFT Right 05/24/2018   Procedure: SKIN GRAFT SPLIT THICKNESS ( RIGHT CALF);  Surgeon: Algernon Huxley, MD;  Location: [facility] ORS;  Service: Vascular;  Laterality: Right;  .  UPPER EXTREMITY ANGIOGRAPHY  10/24/2018   Procedure: Upper Extremity Angiography;  Surgeon: Algernon Huxley, MD;  Location: [place] CV LAB;  Service: Cardiovascular;;  . UPPER EXTREMITY ANGIOGRAPHY Right 02/14/2019   Procedure: UPPER EXTREMITY ANGIOGRAPHY;  Surgeon: Algernon Huxley, MD;  Location: [place] CV LAB;  Service: Cardiovascular;  Laterality: Right;  . WOUND DEBRIDEMENT Right 04/11/2018   Procedure: DEBRIDEMENT WOUND calf muscle and skin;  Surgeon: Algernon Huxley, MD;  Location: [facility] ORS;  Service: Vascular;  Laterality: Right;    There were no vitals filed for this visit.  Subjective Assessment - 09/10/19 0836    Subjective  Patient reports that he is doing well today, has been doing his exercises at home. He reports no stumbles or falls since last therapy session. He denies any pain at rest upon arrival today. No specific questions or concerns currently.    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 Pt DC hospital to SNF for rehab. He has since been back at home. Pt uses a manual WC or power chair for daily mobility. Pt takes tranportation services to HD MWF. Pt was recently fitted for his AKA prosthesis in Jan 2021.  Limitations  Standing;Walking;House hold activities    How long can you sit comfortably?  No difficulty    How long can you stand comfortably?  1 minute    How long can you walk comfortably?  unable    Currently in Pain?  No/denies         TREATMENT   Ther-ex Initial sit to stand from elevated mat table in order for therapist to assist in seated RLE farther down into socket Seated marchesx 20bilateral; Seated clams with manual resistancex 20bilateral; Seated adductor squeeze with manual resistancex 20 bilateral; Seated L HS curls with green tband x 20; Standing alternating marches x 10 BLE in walker with BUE support, cues to lock R prosthetic prior to shifting weight  initially; Standing mini squats x 10 in walker with BUE support;   Gait Training Performed gait training with patientusing rolling walker.Education with patient regarding gait sequencing including advancing walker,unlocking R knee, utilizing momentum to create heel strike on the right, and to lock out right knee. Then shift weight onto RLE and step with LLE utilizing step-to pattern. Pt requiresless cuestoday compared to last session.He does not have any buckles today. Practiced partial step-through gait with patient today.CGAand wheelchair followtoday byPT tech. Pt requires cues for upright postureas well as control step length/walker distance from body.He is able to ambulate 45', 45', and 70.' He continues to demonstrateheavy BUE support on walker handles during gait however therapist provided verbal cues to minimize UE support on walker. Fatigue monitored by therapist throughout and seated rest breaks provided between reps however rest time is less today compared to previous session.   Pt educated throughout session about proper posture and technique with exercises. Improved exercise technique, movement at target joints, use of target muscles after min to mod verbal, visual, tactile cues.   Ptis making excellent progress with therapy and continues to demonstrate excellent motivation. He is able to complete all of his seated exercises with therapy today and introduced new standing exercises to patient including marches and mini squats. Performed gait training with patientusing rolling walker.Education with patient regarding gait sequencing including advancing walker,unlocking R knee, utilizing momentum to create heel strike on the right, and to lock out right knee. Then shift weight onto RLE and step with LLE utilizing step-to pattern. Pt requiresless cuestoday compared to last session.He does not have any buckles today. Practiced partial step-through gait with patient  today.CGAand wheelchair followtoday byPT tech. Pt requires cues for upright postureas well as control step length/walker distance from body.He is able to ambulate 45', 45', and 70.' He continues to demonstrateheavy BUE support on walker handles during gait however therapist provided verbal cues to minimize UE support on walker. Fatigue monitored by therapist throughout and seated rest breaks provided between reps however rest time is less today compared to previous session. Pt encouraged to continue HEP especially prone stretching. Hewill benefit from PT services to address deficits in strength, balance, and mobility in order to return to full function at home.                          PT Short Term Goals - 08/29/19 6979      PT SHORT TERM GOAL #1   Title  Pt to demonstrate ability to perfrom STS transfer from chair c RW minGuard assist and lock his RLE knee joint without assist.    Baseline  ModA from elevated surface at eval; 08/29/19: minA+1 with BUE support from regular height  chair with arm rests   Now able to perform with supervision from chair +2 airexpads.   Time  4    Period  Weeks    Status  Partially Met    Target Date  06/25/19      PT SHORT TERM GOAL #2   Title  Pt to demonstrate 5 degrees hip flexion 10 degrees hip abdct ROM.    Baseline  lacks 5 degrees from neutral in both at eval; At visit 10, has 0 degrees flexion/extension; still lacking 5 degrees ABDCT neutral; 08/29/19: R hip: lacking 5 degrees of extension, 15 degrees abduction    Time  4    Period  Weeks    Status  Achieved    Target Date  06/25/19        PT Long Term Goals - 08/29/19 0843      PT LONG TERM GOAL #1   Title  Pt to demonstrates 15 degrees Rt hip ABDCT, 10 degrees Rt hip extension P/ROM to facilitate prosthesis motor control.    Baseline  08/29/19: R hip: lacking 5 degrees of extension, 15 degrees abduction    Time  8    Period  Weeks    Status  Partially Met     Target Date  10/22/19      PT LONG TERM GOAL #2   Title  Pt to demonstrate 5/5 strength hip extension at 0 degrees flexion/extension    Baseline  has 5/5 in some ranges, but not in ranges specific to standing or gait.; 08/29/19: Unable to get to neutral flexion extension but 5/5 R hip extension in other ranges    Time  8    Period  Weeks    Status  On-going    Target Date  10/22/19      PT LONG TERM GOAL #3   Title  Pt able to perfrom 5x STS from chair with RW, arms ad lib in <21sec    Baseline  At visit 10 requires chair +2 pads +RW; 08/29/19: minA+1 with BUE support from regular height chair with arm rests to stand, unable to perform 5TSTS    Time  8    Period  Weeks    Status  Partially Met    Target Date  10/22/19      PT LONG TERM GOAL #4   Title  Pt will be able to ambulate at least 100' with RW and supervision with proper locking of R prosthesis in order to demonstrate safe household ambulation in order to improve independence with gait without need for power wheelchair    Baseline  08/29/19: Pt has ambulated 29' with CGA and wheelchair follow with RW but episodes of buckling;    Time  12    Period  Weeks    Status  New    Target Date  10/22/19            Plan - 09/10/19 0839    Clinical Impression Statement  Pt is making excellent progress with therapy and continues to demonstrate excellent motivation. He is able to complete all of his seated exercises with therapy today and introduced new standing exercises to patient including marches and mini squats.  Performed gait training with patient using rolling walker. Education with patient regarding gait sequencing including advancing walker, unlocking R knee, utilizing momentum to create heel strike on the right, and to lock out right knee. Then shift weight onto RLE and step with LLE utilizing step-to pattern. Pt requires less  cues today compared to last session. He does not have any buckles today. Practiced partial step-through  gait with patient today. CGA and wheelchair follow today by PT tech. Pt requires cues for upright posture as well as control step length/walker distance from body. He is able to ambulate 45', 45', and 70.' He continues to demonstrate heavy BUE support on walker handles during gait however therapist provided verbal cues to minimize UE support on walker. Fatigue monitored by therapist throughout and seated rest breaks provided between reps however rest time is less today compared to previous session. Pt encouraged to continue HEP especially prone stretching. He will benefit from PT services to address deficits in strength, balance, and mobility in order to return to full function at home.    Personal Factors and Comorbidities  Age;Fitness;Past/Current Experience;Education;Transportation    Examination-Activity Limitations  Transfers;Dressing;Squat;Stairs;Stand;Bed Mobility    Examination-Participation Restrictions  Meal Prep;Cleaning;Community Activity;Driving;Yard Work;Laundry;Shop    Stability/Clinical Decision Making  Stable/Uncomplicated    Rehab Potential  Good    PT Frequency  2x / week    PT Duration  12 weeks    PT Treatment/Interventions  ADLs/Self Care Home Management;Cryotherapy;Electrical Stimulation;DME Instruction;Gait training;Stair training;Functional mobility training;Therapeutic activities;Therapeutic exercise;Balance training;Neuromuscular re-education;Cognitive remediation;Patient/family education;Prosthetic Training;Wheelchair mobility training;Passive range of motion;Dry needling;Scar mobilization;Spinal Manipulations;Joint Manipulations    PT Next Visit Plan  Strength, balance, transfers, and gait training with CGA and wheelchair follow    PT Home Exercise Plan  Medbridge Access Code: [email or web address] (prone positioning), L single leg bridges, L sidelying R hip abduction, hip isometric adduction    Consulted and Agree with Plan of Care  Patient       Patient will benefit from skilled  therapeutic intervention in order to improve the following deficits and impairments:  Decreased activity tolerance, Decreased balance, Decreased mobility, Decreased strength, Decreased cognition, Decreased knowledge of use of DME, Decreased knowledge of precautions, Decreased endurance, Decreased range of motion, Difficulty walking, Decreased safety awareness, Decreased skin integrity, Decreased scar mobility, Hypomobility, Postural dysfunction, Increased edema, Increased muscle spasms, Increased fascial restricitons, Prosthetic Dependency, Impaired tone, Improper body mechanics  Visit Diagnosis: Difficulty in walking, not elsewhere classified  Unsteadiness on feet     Problem List Patient Active Problem List   Diagnosis Date Noted  . Cardiomyopathy ([place]) 01/22/2019  . CHF (congestive heart failure) ([place]) 01/22/2019  . Coronary disease 01/22/2019  . Above knee amputation of right lower extremity ([place]) 11/13/2018  . Leg ulcer, right, with fat layer exposed ([place]) 10/22/2018  . Cellulitis of right leg 07/21/2018  . Lower limb ulcer, calf, right, limited to breakdown of skin ([place]) 06/08/2018  . PVD (peripheral vascular disease) ([place]) 04/17/2018  . Malnutrition of moderate degree 04/11/2018  . Pressure injury of skin 04/06/2018  . Altered mental status 04/04/2018  . Hypothermia 04/04/2018  . Hemodialysis graft malfunction ([place]) 03/26/2018  . Hypercholesterolemia 02/27/2018  . Diabetes ([place]) 02/27/2018  . Weakness of right lower extremity 01/20/2018  . Fever   . Periumbilical abdominal pain   . Confusion 12/22/2017  . Acute delirium 12/21/2017  . Protein-calorie malnutrition, severe 12/19/2017  . Intractable nausea and vomiting 12/18/2017  . Lymphedema 12/13/2017  . Cellulitis 11/27/2017  . Chest pain 11/19/2017  . Atherosclerosis of native arteries of the extremities with ulceration ([place]) 11/07/2017  . Twitching 01/03/2017  . Elevated troponin 10/02/2015  . Complications,  dialysis, catheter, mechanical ([place]) 10/02/2015  . Musculoskeletal chest pain 09/28/2015  . Chronic diastolic CHF (congestive heart failure) ([place]) 09/28/2015  . End stage renal  disease ([place]) 10/09/2012  . Hypertension 10/09/2012  . Gout 10/09/2012   Phillips Grout PT, DPT, GCS  Gilbert Manolis 09/10/2019, 9:59 AM  [place] MAIN [facility] SERVICES [address] [place], [place], [address] Phone: [phone]   Fax:  [phone]  Name: Johnny [name]. MRN: [id number] Date of Birth: [birth year]

## 2019-09-12 ENCOUNTER — Ambulatory Visit: Payer: Medicare Other

## 2019-09-12 ENCOUNTER — Other Ambulatory Visit: Payer: Self-pay

## 2019-09-12 DIAGNOSIS — R262 Difficulty in walking, not elsewhere classified: Secondary | ICD-10-CM

## 2019-09-12 DIAGNOSIS — R2681 Unsteadiness on feet: Secondary | ICD-10-CM

## 2019-09-12 NOTE — Therapy (Signed)
[place] MAIN [facility] SERVICES [address] [place], [place], [address] Phone: [phone]   Fax:  [phone]  Physical Therapy Treatment  Patient Details  Name: Johnny [name]. MRN: [id number] Date of Birth: 05-25-1950 Referring Provider (PT): Eulogio Ditch, NP    Encounter Date: 09/12/2019  PT End of Session - 09/12/19 1000    Visit Number  24    Number of Visits  41    Date for PT Re-Evaluation  10/22/19    Authorization Type  [employer] Medicare    PT Start Time  0845    PT Stop Time  0930    PT Time Calculation (min)  45 min    Equipment Utilized During Treatment  Gait belt    Activity Tolerance  Patient tolerated treatment well;No increased pain    Behavior During Therapy  WFL for tasks assessed/performed       Past Medical History:  Diagnosis Date  . Anemia   . Anxiety   . CHF (congestive heart failure) ([place])   . Chronic kidney disease    esrd dialysis m/w/f  . Gout   . Hyperlipidemia   . Hypertension   . Myocardial infarction ([place]) 2010   10 years ago  . 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  . Peripheral vascular disease [facility])     Past Surgical History:  Procedure Laterality Date  . A/V FISTULAGRAM Right 09/06/2018   Procedure: A/V FISTULAGRAM;  Surgeon: Algernon Huxley, MD;  Location: [place] CV LAB;  Service: Cardiovascular;  Laterality: Right;  . A/V SHUNTOGRAM Left 06/21/2017   Procedure: A/V SHUNTOGRAM;  Surgeon: Katha Cabal, MD;  Location: [place] CV LAB;  Service: Cardiovascular;  Laterality: Left;  . A/V SHUNTOGRAM N/A 10/24/2018   Procedure: A/V SHUNTOGRAM;  Surgeon: Algernon Huxley, MD;  Location: [place] CV LAB;  Service: Cardiovascular;  Laterality: N/A;  . ABOVE KNEE LEG AMPUTATION Right 2020  . AMPUTATION Right 10/25/2018   Procedure: AMPUTATION ABOVE KNEE;  Surgeon: Algernon Huxley, MD;  Location: [facility] ORS;  Service: General;  Laterality: Right;  . APPLICATION  OF WOUND VAC Right 04/11/2018   Procedure: APPLICATION OF WOUND VAC;  Surgeon: Algernon Huxley, MD;  Location: [facility] ORS;  Service: Vascular;  Laterality: Right;  . AV FISTULA PLACEMENT Left 09/18/2015   Procedure: INSERTION OF ARTERIOVENOUS (AV) GORE-TEX GRAFT ARM ( BRACH/AXILLARY GRAFT W/ INSTANT STICK GRAFT );  Surgeon: Katha Cabal, MD;  Location: [facility] ORS;  Service: Vascular;  Laterality: Left;  . AV FISTULA PLACEMENT Right 07/19/2018   Procedure: INSERTION OF ARTERIOVENOUS (AV) GORE-TEX GRAFT ARM ( BRACHIAL AXILLARY);  Surgeon: Algernon Huxley, MD;  Location: [facility] ORS;  Service: Vascular;  Laterality: Right;  . DIALYSIS FISTULA CREATION Right 10/2017   right chest perm cath  . DIALYSIS/PERMA CATHETER REMOVAL N/A 09/13/2018   Procedure: DIALYSIS/PERMA CATHETER REMOVAL;  Surgeon: Algernon Huxley, MD;  Location: [place] CV LAB;  Service: Cardiovascular;  Laterality: N/A;  . ESOPHAGOGASTRODUODENOSCOPY N/A 12/19/2017   Procedure: ESOPHAGOGASTRODUODENOSCOPY (EGD);  Surgeon: Lin Landsman, MD;  Location: [facility] ENDOSCOPY;  Service: Gastroenterology;  Laterality: N/A;  . LOWER EXTREMITY ANGIOGRAPHY Left 11/16/2017   Procedure: LOWER EXTREMITY ANGIOGRAPHY;  Surgeon: Algernon Huxley, MD;  Location: [place] CV LAB;  Service: Cardiovascular;  Laterality: Left;  . LOWER EXTREMITY ANGIOGRAPHY Right 01/18/2018   Procedure: LOWER EXTREMITY ANGIOGRAPHY;  Surgeon: Algernon Huxley, MD;  Location: [place] INVASIVE CV  LAB;  Service: Cardiovascular;  Laterality: Right;  . LOWER EXTREMITY ANGIOGRAPHY Left 04/02/2018   Procedure: LOWER EXTREMITY ANGIOGRAPHY;  Surgeon: Algernon Huxley, MD;  Location: [place] CV LAB;  Service: Cardiovascular;  Laterality: Left;  . LOWER EXTREMITY ANGIOGRAPHY Right 04/09/2018   Procedure: Lower Extremity Angiography with possible intervention;  Surgeon: Algernon Huxley, MD;  Location: [place] CV LAB;  Service: Cardiovascular;  Laterality: Right;  . LOWER EXTREMITY ANGIOGRAPHY Right  07/23/2018   Procedure: Lower Extremity Angiography;  Surgeon: Algernon Huxley, MD;  Location: [place] CV LAB;  Service: Cardiovascular;  Laterality: Right;  . LOWER EXTREMITY ANGIOGRAPHY Right 09/13/2018   Procedure: LOWER EXTREMITY ANGIOGRAPHY;  Surgeon: Algernon Huxley, MD;  Location: [place] CV LAB;  Service: Cardiovascular;  Laterality: Right;  . LOWER EXTREMITY VENOGRAPHY Right 09/13/2018   Procedure: LOWER EXTREMITY VENOGRAPHY;  Surgeon: Algernon Huxley, MD;  Location: [place] CV LAB;  Service: Cardiovascular;  Laterality: Right;  . PERIPHERAL VASCULAR CATHETERIZATION Left 09/01/2015   Procedure: A/V Shuntogram/Fistulagram;  Surgeon: Katha Cabal, MD;  Location: [place] CV LAB;  Service: Cardiovascular;  Laterality: Left;  . PERIPHERAL VASCULAR CATHETERIZATION N/A 09/30/2015   Procedure: A/V Shuntogram/Fistulagram with perm cathether removal;  Surgeon: Algernon Huxley, MD;  Location: [place] CV LAB;  Service: Cardiovascular;  Laterality: N/A;  . PERIPHERAL VASCULAR CATHETERIZATION Left 09/30/2015   Procedure: A/V Shunt Intervention;  Surgeon: Algernon Huxley, MD;  Location: [place] CV LAB;  Service: Cardiovascular;  Laterality: Left;  . PERIPHERAL VASCULAR CATHETERIZATION Left 12/03/2015   Procedure: Thrombectomy;  Surgeon: Algernon Huxley, MD;  Location: [place] CV LAB;  Service: Cardiovascular;  Laterality: Left;  . PERIPHERAL VASCULAR CATHETERIZATION Left 01/28/2016   Procedure: Thrombectomy;  Surgeon: Algernon Huxley, MD;  Location: [place] CV LAB;  Service: Cardiovascular;  Laterality: Left;  . PERIPHERAL VASCULAR CATHETERIZATION N/A 01/28/2016   Procedure: A/V Shuntogram/Fistulagram;  Surgeon: Algernon Huxley, MD;  Location: [place] CV LAB;  Service: Cardiovascular;  Laterality: N/A;  . SKIN SPLIT GRAFT Right 05/24/2018   Procedure: SKIN GRAFT SPLIT THICKNESS ( RIGHT CALF);  Surgeon: Algernon Huxley, MD;  Location: [facility] ORS;  Service: Vascular;  Laterality: Right;  .  UPPER EXTREMITY ANGIOGRAPHY  10/24/2018   Procedure: Upper Extremity Angiography;  Surgeon: Algernon Huxley, MD;  Location: [place] CV LAB;  Service: Cardiovascular;;  . UPPER EXTREMITY ANGIOGRAPHY Right 02/14/2019   Procedure: UPPER EXTREMITY ANGIOGRAPHY;  Surgeon: Algernon Huxley, MD;  Location: [place] CV LAB;  Service: Cardiovascular;  Laterality: Right;  . WOUND DEBRIDEMENT Right 04/11/2018   Procedure: DEBRIDEMENT WOUND calf muscle and skin;  Surgeon: Algernon Huxley, MD;  Location: [facility] ORS;  Service: Vascular;  Laterality: Right;      There were no vitals filed for this visit.  Subjective Assessment - 09/12/19 1242    Subjective  Patient reports that he is doing well today, has been doing his exercises at home. He reports no stumbles or falls since last therapy session. He denies any pain at rest upon arrival today. No specific questions or concerns currently.    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 Pt DC hospital to SNF for rehab. He has since been back at home. Pt uses a manual WC or power chair for daily mobility. Pt takes tranportation services to HD MWF. Pt was recently fitted for his AKA prosthesis in Jan 2021.  Limitations  Standing;Walking;House hold activities    How long can you sit comfortably?  No difficulty    How long can you stand comfortably?  1 minute    How long can you walk comfortably?  unable    Currently in Pain?  No/denies         TREATMENT   Ther-ex Initial sit to stand from elevated mat table in order for therapist to assist in seated RLE farther down into socket Seated marchesx 20bilateral; Seated clams with manual resistancex 20bilateral; Seated adductor squeeze with manual resistancex 20 bilateral; Seated L LAQ with manual resistance form therapist x 20; Seated L HS curls with manual resistance from therapist x 20; Standing alternating marches x 10 BLE in walker  with BUE support, cues to lock R prosthetic prior to shifting weight initially; Standing mini squats x 10 in walker with BUE support;   Neuromuscular Re-education  WBOS/NBOS static balance with eyes closed x 30s each; WBOS/NBOS horizontal and vertical head turns x 30s each in both positoins;   Gait Training Performed gait training with patientusing rolling walker.Education with patient regarding gait sequencing including advancing walker,unlocking R knee, utilizing momentum to create heel strike on the right, and to lock out right knee. Then shift weight onto RLE and step with LLE utilizing step-to pattern. Pt requiresless cuestoday compared to last session.He does not have any buckles today. Practiced partial step-through gait with patient today which is improved from last session.CGAand wheelchair followtoday byPT tech. Pt requires cues for upright posturebut demonstrates better coordination with walker management and stepping.He is able to ambulate 102', 65', and 20.' He continues to demonstrateheavy BUE support on walker handles during gait. Fatigue monitored by therapist throughout and seated rest breaks provided between reps.   Pt educated throughout session about proper posture and technique with exercises. Improved exercise technique, movement at target joints, use of target muscles after min to mod verbal, visual, tactile cues.   Ptis making excellent progress with therapy and continues to demonstrate excellent motivation. He is able to complete all of his seated and standing exercises with therapy today. Initiated some standing balance exercises with patient today which will be progressed in the future. Performed gait training with patientusing rolling walker.Education with patient regarding gait sequencing including advancing walker,unlocking R knee, utilizing momentum to create heel strike on the right, and to lock out right knee. Then shift weight onto RLE and  step with LLE utilizing step-to pattern. Pt requiresless cuestoday compared to last session.He does not have any buckles today. Practiced partial step-through gait with patient today which is improved from last session.CGAand wheelchair followtoday byPT tech. Pt requires cues for upright posturebut demonstrates better coordination with walker management and stepping.He is able to ambulate 102', 65', and 20.' He continues to demonstrateheavy BUE support on walker handles during gait. Fatigue monitored by therapist throughout and seated rest breaks provided between reps. Pt encouraged to continue HEP especially prone stretching. Hewill benefit from PT services to address deficits in strength, balance, and mobility in order to return to full function at home.                          PT Short Term Goals - 08/29/19 3235      PT SHORT TERM GOAL #1   Title  Pt to demonstrate ability to perfrom STS transfer from chair c RW minGuard assist and lock his RLE knee joint without assist.    Baseline  ModA  from elevated surface at eval; 08/29/19: minA+1 with BUE support from regular height chair with arm rests   Now able to perform with supervision from chair +2 airexpads.   Time  4    Period  Weeks    Status  Partially Met    Target Date  06/25/19      PT SHORT TERM GOAL #2   Title  Pt to demonstrate 5 degrees hip flexion 10 degrees hip abdct ROM.    Baseline  lacks 5 degrees from neutral in both at eval; At visit 10, has 0 degrees flexion/extension; still lacking 5 degrees ABDCT neutral; 08/29/19: R hip: lacking 5 degrees of extension, 15 degrees abduction    Time  4    Period  Weeks    Status  Achieved    Target Date  06/25/19        PT Long Term Goals - 08/29/19 0843      PT LONG TERM GOAL #1   Title  Pt to demonstrates 15 degrees Rt hip ABDCT, 10 degrees Rt hip extension P/ROM to facilitate prosthesis motor control.    Baseline  08/29/19: R hip: lacking 5 degrees  of extension, 15 degrees abduction    Time  8    Period  Weeks    Status  Partially Met    Target Date  10/22/19      PT LONG TERM GOAL #2   Title  Pt to demonstrate 5/5 strength hip extension at 0 degrees flexion/extension    Baseline  has 5/5 in some ranges, but not in ranges specific to standing or gait.; 08/29/19: Unable to get to neutral flexion extension but 5/5 R hip extension in other ranges    Time  8    Period  Weeks    Status  On-going    Target Date  10/22/19      PT LONG TERM GOAL #3   Title  Pt able to perfrom 5x STS from chair with RW, arms ad lib in <21sec    Baseline  At visit 10 requires chair +2 pads +RW; 08/29/19: minA+1 with BUE support from regular height chair with arm rests to stand, unable to perform 5TSTS    Time  8    Period  Weeks    Status  Partially Met    Target Date  10/22/19      PT LONG TERM GOAL #4   Title  Pt will be able to ambulate at least 100' with RW and supervision with proper locking of R prosthesis in order to demonstrate safe household ambulation in order to improve independence with gait without need for power wheelchair    Baseline  08/29/19: Pt has ambulated 52' with CGA and wheelchair follow with RW but episodes of buckling;    Time  12    Period  Weeks    Status  New    Target Date  10/22/19            Plan - 09/12/19 1242    Clinical Impression Statement  Pt is making excellent progress with therapy and continues to demonstrate excellent motivation. He is able to complete all of his seated and standing exercises with therapy today. Initiated some standing balance exercises with patient today which will be progressed in the future. Performed gait training with patient using rolling walker. Education with patient regarding gait sequencing including advancing walker, unlocking R knee, utilizing momentum to create heel strike on the right, and to lock out  right knee. Then shift weight onto RLE and step with LLE utilizing step-to  pattern. Pt requires less cues today compared to last session. He does not have any buckles today. Practiced partial step-through gait with patient today which is improved from last session. CGA and wheelchair follow today by PT tech. Pt requires cues for upright posture but demonstrates better coordination with walker management and stepping. He is able to ambulate 102', 65', and 20.' He continues to demonstrate heavy BUE support on walker handles during gait. Fatigue monitored by therapist throughout and seated rest breaks provided between reps. Pt encouraged to continue HEP especially prone stretching. He will benefit from PT services to address deficits in strength, balance, and mobility in order to return to full function at home.    Personal Factors and Comorbidities  Age;Fitness;Past/Current Experience;Education;Transportation    Examination-Activity Limitations  Transfers;Dressing;Squat;Stairs;Stand;Bed Mobility    Examination-Participation Restrictions  Meal Prep;Cleaning;Community Activity;Driving;Yard Work;Laundry;Shop    Stability/Clinical Decision Making  Stable/Uncomplicated    Rehab Potential  Good    PT Frequency  2x / week    PT Duration  12 weeks    PT Treatment/Interventions  ADLs/Self Care Home Management;Cryotherapy;Electrical Stimulation;DME Instruction;Gait training;Stair training;Functional mobility training;Therapeutic activities;Therapeutic exercise;Balance training;Neuromuscular re-education;Cognitive remediation;Patient/family education;Prosthetic Training;Wheelchair mobility training;Passive range of motion;Dry needling;Scar mobilization;Spinal Manipulations;Joint Manipulations    PT Next Visit Plan  Strength, balance, transfers, and gait training with CGA and wheelchair follow    PT Home Exercise Plan  Medbridge Access Code: [email or web address] (prone positioning), L single leg bridges, L sidelying R hip abduction, hip isometric adduction    Consulted and Agree with Plan of Care   Patient       Patient will benefit from skilled therapeutic intervention in order to improve the following deficits and impairments:  Decreased activity tolerance, Decreased balance, Decreased mobility, Decreased strength, Decreased cognition, Decreased knowledge of use of DME, Decreased knowledge of precautions, Decreased endurance, Decreased range of motion, Difficulty walking, Decreased safety awareness, Decreased skin integrity, Decreased scar mobility, Hypomobility, Postural dysfunction, Increased edema, Increased muscle spasms, Increased fascial restricitons, Prosthetic Dependency, Impaired tone, Improper body mechanics  Visit Diagnosis: Difficulty in walking, not elsewhere classified  Unsteadiness on feet     Problem List Patient Active Problem List   Diagnosis Date Noted  . Cardiomyopathy ([place]) 01/22/2019  . CHF (congestive heart failure) ([place]) 01/22/2019  . Coronary disease 01/22/2019  . Above knee amputation of right lower extremity ([place]) 11/13/2018  . Leg ulcer, right, with fat layer exposed ([place]) 10/22/2018  . Cellulitis of right leg 07/21/2018  . Lower limb ulcer, calf, right, limited to breakdown of skin ([place]) 06/08/2018  . PVD (peripheral vascular disease) ([place]) 04/17/2018  . Malnutrition of moderate degree 04/11/2018  . Pressure injury of skin 04/06/2018  . Altered mental status 04/04/2018  . Hypothermia 04/04/2018  . Hemodialysis graft malfunction ([place]) 03/26/2018  . Hypercholesterolemia 02/27/2018  . Diabetes ([place]) 02/27/2018  . Weakness of right lower extremity 01/20/2018  . Fever   . Periumbilical abdominal pain   . Confusion 12/22/2017  . Acute delirium 12/21/2017  . Protein-calorie malnutrition, severe 12/19/2017  . Intractable nausea and vomiting 12/18/2017  . Lymphedema 12/13/2017  . Cellulitis 11/27/2017  . Chest pain 11/19/2017  . Atherosclerosis of native arteries of the extremities with ulceration ([place]) 11/07/2017  . Twitching 01/03/2017  .  Elevated troponin 10/02/2015  . Complications, dialysis, catheter, mechanical ([place]) 10/02/2015  . Musculoskeletal chest pain 09/28/2015  . Chronic diastolic CHF (congestive heart failure) ([place]) 09/28/2015  . End  stage renal disease ([place]) 10/09/2012  . Hypertension 10/09/2012  . Gout 10/09/2012   Phillips Grout PT, DPT, GCS  Huprich,Jason 09/12/2019, 12:50 PM  [place] MAIN [facility] SERVICES [address] [place], [place], [address] Phone: [phone]   Fax:  [phone]  Name: Mayjor Ager. MRN: [id number] Date of Birth: 11/02/1950

## 2019-09-17 ENCOUNTER — Ambulatory Visit: Payer: Medicare Other

## 2019-09-17 ENCOUNTER — Other Ambulatory Visit: Payer: Self-pay

## 2019-09-17 DIAGNOSIS — R262 Difficulty in walking, not elsewhere classified: Secondary | ICD-10-CM | POA: Diagnosis not present

## 2019-09-17 DIAGNOSIS — R2681 Unsteadiness on feet: Secondary | ICD-10-CM

## 2019-09-17 NOTE — Therapy (Signed)
[place] MAIN [facility] SERVICES [address] [place], [place], [address] Phone: [phone]   Fax:  [phone]  Physical Therapy Treatment  Patient Details  Name: Johnny [name]. MRN: [id number] Date of Birth: 06-23-1950 Referring Provider (PT): Eulogio Ditch, NP    Encounter Date: 09/17/2019  PT End of Session - 09/17/19 0834    Visit Number  25    Number of Visits  41    Date for PT Re-Evaluation  10/22/19    Authorization Type  [employer] Medicare    PT Start Time  0845    PT Stop Time  0931    PT Time Calculation (min)  46 min    Equipment Utilized During Treatment  Gait belt    Activity Tolerance  Patient tolerated treatment well;No increased pain    Behavior During Therapy  WFL for tasks assessed/performed       Past Medical History:  Diagnosis Date  . Anemia   . Anxiety   . CHF (congestive heart failure) ([place])   . Chronic kidney disease    esrd dialysis m/w/f  . Gout   . Hyperlipidemia   . Hypertension   . Myocardial infarction ([place]) 2010   10 years ago  . 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  . Peripheral vascular disease [facility])     Past Surgical History:  Procedure Laterality Date  . A/V FISTULAGRAM Right 09/06/2018   Procedure: A/V FISTULAGRAM;  Surgeon: Algernon Huxley, MD;  Location: [place] CV LAB;  Service: Cardiovascular;  Laterality: Right;  . A/V SHUNTOGRAM Left 06/21/2017   Procedure: A/V SHUNTOGRAM;  Surgeon: Katha Cabal, MD;  Location: [place] CV LAB;  Service: Cardiovascular;  Laterality: Left;  . A/V SHUNTOGRAM N/A 10/24/2018   Procedure: A/V SHUNTOGRAM;  Surgeon: Algernon Huxley, MD;  Location: [place] CV LAB;  Service: Cardiovascular;  Laterality: N/A;  . ABOVE KNEE LEG AMPUTATION Right 2020  . AMPUTATION Right 10/25/2018   Procedure: AMPUTATION ABOVE KNEE;  Surgeon: Algernon Huxley, MD;  Location: [facility] ORS;  Service: General;  Laterality: Right;  .  APPLICATION OF WOUND VAC Right 04/11/2018   Procedure: APPLICATION OF WOUND VAC;  Surgeon: Algernon Huxley, MD;  Location: [facility] ORS;  Service: Vascular;  Laterality: Right;  . AV FISTULA PLACEMENT Left 09/18/2015   Procedure: INSERTION OF ARTERIOVENOUS (AV) GORE-TEX GRAFT ARM ( BRACH/AXILLARY GRAFT W/ INSTANT STICK GRAFT );  Surgeon: Katha Cabal, MD;  Location: [facility] ORS;  Service: Vascular;  Laterality: Left;  . AV FISTULA PLACEMENT Right 07/19/2018   Procedure: INSERTION OF ARTERIOVENOUS (AV) GORE-TEX GRAFT ARM ( BRACHIAL AXILLARY);  Surgeon: Algernon Huxley, MD;  Location: [facility] ORS;  Service: Vascular;  Laterality: Right;  . DIALYSIS FISTULA CREATION Right 10/2017   right chest perm cath  . DIALYSIS/PERMA CATHETER REMOVAL N/A 09/13/2018   Procedure: DIALYSIS/PERMA CATHETER REMOVAL;  Surgeon: Algernon Huxley, MD;  Location: [place] CV LAB;  Service: Cardiovascular;  Laterality: N/A;  . ESOPHAGOGASTRODUODENOSCOPY N/A 12/19/2017   Procedure: ESOPHAGOGASTRODUODENOSCOPY (EGD);  Surgeon: Lin Landsman, MD;  Location: [facility] ENDOSCOPY;  Service: Gastroenterology;  Laterality: N/A;  . LOWER EXTREMITY ANGIOGRAPHY Left 11/16/2017   Procedure: LOWER EXTREMITY ANGIOGRAPHY;  Surgeon: Algernon Huxley, MD;  Location: [place] CV LAB;  Service: Cardiovascular;  Laterality: Left;  . LOWER EXTREMITY ANGIOGRAPHY Right 01/18/2018   Procedure: LOWER EXTREMITY ANGIOGRAPHY;  Surgeon: Algernon Huxley, MD;  Location: [place] INVASIVE CV  LAB;  Service: Cardiovascular;  Laterality: Right;  . LOWER EXTREMITY ANGIOGRAPHY Left 04/02/2018   Procedure: LOWER EXTREMITY ANGIOGRAPHY;  Surgeon: Algernon Huxley, MD;  Location: [place] CV LAB;  Service: Cardiovascular;  Laterality: Left;  . LOWER EXTREMITY ANGIOGRAPHY Right 04/09/2018   Procedure: Lower Extremity Angiography with possible intervention;  Surgeon: Algernon Huxley, MD;  Location: [place] CV LAB;  Service: Cardiovascular;  Laterality: Right;  . LOWER EXTREMITY  ANGIOGRAPHY Right 07/23/2018   Procedure: Lower Extremity Angiography;  Surgeon: Algernon Huxley, MD;  Location: [place] CV LAB;  Service: Cardiovascular;  Laterality: Right;  . LOWER EXTREMITY ANGIOGRAPHY Right 09/13/2018   Procedure: LOWER EXTREMITY ANGIOGRAPHY;  Surgeon: Algernon Huxley, MD;  Location: [place] CV LAB;  Service: Cardiovascular;  Laterality: Right;  . LOWER EXTREMITY VENOGRAPHY Right 09/13/2018   Procedure: LOWER EXTREMITY VENOGRAPHY;  Surgeon: Algernon Huxley, MD;  Location: [place] CV LAB;  Service: Cardiovascular;  Laterality: Right;  . PERIPHERAL VASCULAR CATHETERIZATION Left 09/01/2015   Procedure: A/V Shuntogram/Fistulagram;  Surgeon: Katha Cabal, MD;  Location: [place] CV LAB;  Service: Cardiovascular;  Laterality: Left;  . PERIPHERAL VASCULAR CATHETERIZATION N/A 09/30/2015   Procedure: A/V Shuntogram/Fistulagram with perm cathether removal;  Surgeon: Algernon Huxley, MD;  Location: [place] CV LAB;  Service: Cardiovascular;  Laterality: N/A;  . PERIPHERAL VASCULAR CATHETERIZATION Left 09/30/2015   Procedure: A/V Shunt Intervention;  Surgeon: Algernon Huxley, MD;  Location: [place] CV LAB;  Service: Cardiovascular;  Laterality: Left;  . PERIPHERAL VASCULAR CATHETERIZATION Left 12/03/2015   Procedure: Thrombectomy;  Surgeon: Algernon Huxley, MD;  Location: [place] CV LAB;  Service: Cardiovascular;  Laterality: Left;  . PERIPHERAL VASCULAR CATHETERIZATION Left 01/28/2016   Procedure: Thrombectomy;  Surgeon: Algernon Huxley, MD;  Location: [place] CV LAB;  Service: Cardiovascular;  Laterality: Left;  . PERIPHERAL VASCULAR CATHETERIZATION N/A 01/28/2016   Procedure: A/V Shuntogram/Fistulagram;  Surgeon: Algernon Huxley, MD;  Location: [place] CV LAB;  Service: Cardiovascular;  Laterality: N/A;  . SKIN SPLIT GRAFT Right 05/24/2018   Procedure: SKIN GRAFT SPLIT THICKNESS ( RIGHT CALF);  Surgeon: Algernon Huxley, MD;  Location: [facility] ORS;  Service: Vascular;   Laterality: Right;  . UPPER EXTREMITY ANGIOGRAPHY  10/24/2018   Procedure: Upper Extremity Angiography;  Surgeon: Algernon Huxley, MD;  Location: [place] CV LAB;  Service: Cardiovascular;;  . UPPER EXTREMITY ANGIOGRAPHY Right 02/14/2019   Procedure: UPPER EXTREMITY ANGIOGRAPHY;  Surgeon: Algernon Huxley, MD;  Location: [place] CV LAB;  Service: Cardiovascular;  Laterality: Right;  . WOUND DEBRIDEMENT Right 04/11/2018   Procedure: DEBRIDEMENT WOUND calf muscle and skin;  Surgeon: Algernon Huxley, MD;  Location: [facility] ORS;  Service: Vascular;  Laterality: Right;    There were no vitals filed for this visit.  Subjective Assessment - 09/17/19 0834    Subjective  Patient reports that he is doing well today, has been doing his exercises at home. He reports no stumbles or falls since last therapy session and states that he has been walking some at home. He denies any pain at rest upon arrival today. No specific questions or concerns currently.    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 Pt DC hospital to SNF for rehab. He has since been back at home. Pt uses a manual WC or power chair for daily mobility. Pt takes tranportation services to HD MWF. Pt was recently fitted  for his AKA prosthesis in Jan 2021.    Limitations  Standing;Walking;House hold activities    How long can you sit comfortably?  No difficulty    How long can you stand comfortably?  1 minute    How long can you walk comfortably?  unable    Currently in Pain?  No/denies           TREATMENT   Ther-ex Initial sit to stand from elevated mat table in order for therapist to assist in seated RLE farther down into socket Seated marchesx 20bilateral; Seated clams with manual resistancex 20bilateral; Seated adductor squeeze with manual resistancex 20 bilateral; Seated L LAQ with manual resistance from therapist x 20; Seated L HS curls with red tband x  20; Sit to stand without walker but with hand support on mat table, practicing locking R knee and balancing once standing x 10; Standing alternating marches x 10 BLE in walker with BUE support, cues to lock R prosthetic prior to shifting weight initially, therapist assisting keeping R foot in line with L foot; Standing mini squats x 10 in walker with BUE support; WBOS static balance with eyes open x 30s; 6" L toe taps with pt standing inside walker, attempted initially without UE support and then single UE support but pt unable to perform. He requires BUE support on walker to perform. Pt encouraged to utilize UE support as little as possible to encourage WB through RLE;   Gait Training Performed gait training with patientusing rolling walker.Minimal education today required with patient regarding gait sequencing including advancing walker,unlocking R knee, utilizing momentum to create heel strike on the right, and to lock out right knee. He demonstrates improved independence today and he does not experience any buckles today. Practiced partial step-through gait with patient today and it is improved from last session.CGAand wheelchair followtoday byPT tech. He continues to require repeated cues for upright posturethroughout gait training.He is able to ambulate80' and 66' completing a full lap through rehab gym and rehab hallway.He continues to demonstrateheavy BUE support on walker handles during gait. Fatigue monitored by therapist throughout and one seated rest breaks provided between reps.   Pt educated throughout session about proper posture and technique with exercises. Improved exercise technique, movement at target joints, use of target muscles after min to mod verbal, visual, tactile cues.   Ptis making excellent progress with therapy and continues to demonstrate excellent motivation. He is able to complete all of his seated and standing exercises with therapy today and  continued to progress strengthening to encourage more R single leg stance. He is unable to standing on RLE in single leg without BUE support however encouraged as little support on walker as necessary. Performed gait training with patientusing rolling walker.Minimal education today required with patient regarding gait sequencing including advancing walker,unlocking R knee, utilizing momentum to create heel strike on the right, and to lock out right knee. He demonstrates improved independence today and he does not experience any buckles today. Practiced partial step-through gait with patient today and it is improved from last session.CGAand wheelchair followtoday byPT tech. He continues to require repeated cues for upright posturethroughout gait training.He is able to ambulate80' and 66' completing a full lap through rehab gym and rehab hallway.He continues to demonstrateheavy BUE support on walker handles during gait. Fatigue monitored by therapist throughout and one seated rest breaks provided between reps. Pt encouraged to continue HEP especially prone stretching. Hewill benefit from PT services to address deficits in strength, balance, and  mobility in order to return to full function at home.                       PT Short Term Goals - 08/29/19 2637      PT SHORT TERM GOAL #1   Title  Pt to demonstrate ability to perfrom STS transfer from chair c RW minGuard assist and lock his RLE knee joint without assist.    Baseline  ModA from elevated surface at eval; 08/29/19: minA+1 with BUE support from regular height chair with arm rests   Now able to perform with supervision from chair +2 airexpads.   Time  4    Period  Weeks    Status  Partially Met    Target Date  06/25/19      PT SHORT TERM GOAL #2   Title  Pt to demonstrate 5 degrees hip flexion 10 degrees hip abdct ROM.    Baseline  lacks 5 degrees from neutral in both at eval; At visit 10, has 0 degrees  flexion/extension; still lacking 5 degrees ABDCT neutral; 08/29/19: R hip: lacking 5 degrees of extension, 15 degrees abduction    Time  4    Period  Weeks    Status  Achieved    Target Date  06/25/19        PT Long Term Goals - 08/29/19 0843      PT LONG TERM GOAL #1   Title  Pt to demonstrates 15 degrees Rt hip ABDCT, 10 degrees Rt hip extension P/ROM to facilitate prosthesis motor control.    Baseline  08/29/19: R hip: lacking 5 degrees of extension, 15 degrees abduction    Time  8    Period  Weeks    Status  Partially Met    Target Date  10/22/19      PT LONG TERM GOAL #2   Title  Pt to demonstrate 5/5 strength hip extension at 0 degrees flexion/extension    Baseline  has 5/5 in some ranges, but not in ranges specific to standing or gait.; 08/29/19: Unable to get to neutral flexion extension but 5/5 R hip extension in other ranges    Time  8    Period  Weeks    Status  On-going    Target Date  10/22/19      PT LONG TERM GOAL #3   Title  Pt able to perfrom 5x STS from chair with RW, arms ad lib in <21sec    Baseline  At visit 10 requires chair +2 pads +RW; 08/29/19: minA+1 with BUE support from regular height chair with arm rests to stand, unable to perform 5TSTS    Time  8    Period  Weeks    Status  Partially Met    Target Date  10/22/19      PT LONG TERM GOAL #4   Title  Pt will be able to ambulate at least 100' with RW and supervision with proper locking of R prosthesis in order to demonstrate safe household ambulation in order to improve independence with gait without need for power wheelchair    Baseline  08/29/19: Pt has ambulated 7' with CGA and wheelchair follow with RW but episodes of buckling;    Time  12    Period  Weeks    Status  New    Target Date  10/22/19            Plan - 09/17/19 8588  Clinical Impression Statement  Pt is making excellent progress with therapy and continues to demonstrate excellent motivation. He is able to complete all of his  seated and standing exercises with therapy today and continued to progress strengthening to encourage more R single leg stance. He is unable to standing on RLE in single leg without BUE support however encouraged as little support on walker as necessary. Performed gait training with patient using rolling walker. Minimal education today required with patient regarding gait sequencing including advancing walker, unlocking R knee, utilizing momentum to create heel strike on the right, and to lock out right knee. He demonstrates improved independence today and he does not experience any buckles today. Practiced partial step-through gait with patient today and it is improved from last session. CGA and wheelchair follow today by PT tech. He continues to require repeated cues for upright posture throughout gait training. He is able to ambulate 52' and 66' completing a full lap through rehab gym and rehab hallway. He continues to demonstrate heavy BUE support on walker handles during gait. Fatigue monitored by therapist throughout and one seated rest breaks provided between reps. Pt encouraged to continue HEP especially prone stretching. He will benefit from PT services to address deficits in strength, balance, and mobility in order to return to full function at home.    Personal Factors and Comorbidities  Age;Fitness;Past/Current Experience;Education;Transportation    Examination-Activity Limitations  Transfers;Dressing;Squat;Stairs;Stand;Bed Mobility    Examination-Participation Restrictions  Meal Prep;Cleaning;Community Activity;Driving;Yard Work;Laundry;Shop    Stability/Clinical Decision Making  Stable/Uncomplicated    Rehab Potential  Good    PT Frequency  2x / week    PT Duration  12 weeks    PT Treatment/Interventions  ADLs/Self Care Home Management;Cryotherapy;Electrical Stimulation;DME Instruction;Gait training;Stair training;Functional mobility training;Therapeutic activities;Therapeutic exercise;Balance  training;Neuromuscular re-education;Cognitive remediation;Patient/family education;Prosthetic Training;Wheelchair mobility training;Passive range of motion;Dry needling;Scar mobilization;Spinal Manipulations;Joint Manipulations    PT Next Visit Plan  Strength, balance, transfers, and gait training with CGA and wheelchair follow    PT Home Exercise Plan  Medbridge Access Code: [email or web address] (prone positioning), L single leg bridges, L sidelying R hip abduction, hip isometric adduction    Consulted and Agree with Plan of Care  Patient       Patient will benefit from skilled therapeutic intervention in order to improve the following deficits and impairments:  Decreased activity tolerance, Decreased balance, Decreased mobility, Decreased strength, Decreased cognition, Decreased knowledge of use of DME, Decreased knowledge of precautions, Decreased endurance, Decreased range of motion, Difficulty walking, Decreased safety awareness, Decreased skin integrity, Decreased scar mobility, Hypomobility, Postural dysfunction, Increased edema, Increased muscle spasms, Increased fascial restricitons, Prosthetic Dependency, Impaired tone, Improper body mechanics  Visit Diagnosis: Difficulty in walking, not elsewhere classified  Unsteadiness on feet     Problem List Patient Active Problem List   Diagnosis Date Noted  . Cardiomyopathy ([place]) 01/22/2019  . CHF (congestive heart failure) ([place]) 01/22/2019  . Coronary disease 01/22/2019  . Above knee amputation of right lower extremity ([place]) 11/13/2018  . Leg ulcer, right, with fat layer exposed ([place]) 10/22/2018  . Cellulitis of right leg 07/21/2018  . Lower limb ulcer, calf, right, limited to breakdown of skin ([place]) 06/08/2018  . PVD (peripheral vascular disease) ([place]) 04/17/2018  . Malnutrition of moderate degree 04/11/2018  . Pressure injury of skin 04/06/2018  . Altered mental status 04/04/2018  . Hypothermia 04/04/2018  . Hemodialysis graft malfunction  ([place]) 03/26/2018  . Hypercholesterolemia 02/27/2018  . Diabetes ([place]) 02/27/2018  . Weakness of right lower extremity  01/20/2018  . Fever   . Periumbilical abdominal pain   . Confusion 12/22/2017  . Acute delirium 12/21/2017  . Protein-calorie malnutrition, severe 12/19/2017  . Intractable nausea and vomiting 12/18/2017  . Lymphedema 12/13/2017  . Cellulitis 11/27/2017  . Chest pain 11/19/2017  . Atherosclerosis of native arteries of the extremities with ulceration ([place]) 11/07/2017  . Twitching 01/03/2017  . Elevated troponin 10/02/2015  . Complications, dialysis, catheter, mechanical ([place]) 10/02/2015  . Musculoskeletal chest pain 09/28/2015  . Chronic diastolic CHF (congestive heart failure) ([place]) 09/28/2015  . End stage renal disease ([place]) 10/09/2012  . Hypertension 10/09/2012  . Gout 10/09/2012   Phillips Grout PT, DPT, GCS  Johnny [name] 09/17/2019, 12:44 PM  [place] MAIN [facility] SERVICES [address] [place], [place], [address] Phone: [phone]   Fax:  [phone]  Name: Johnny [name]. MRN: [id number] Date of Birth: February 07, 1951

## 2019-09-19 ENCOUNTER — Other Ambulatory Visit: Payer: Self-pay

## 2019-09-19 ENCOUNTER — Ambulatory Visit: Payer: Medicare Other | Admitting: Physical Therapy

## 2019-09-19 DIAGNOSIS — R262 Difficulty in walking, not elsewhere classified: Secondary | ICD-10-CM

## 2019-09-19 DIAGNOSIS — R2681 Unsteadiness on feet: Secondary | ICD-10-CM

## 2019-09-19 NOTE — Therapy (Signed)
[place] MAIN [facility] SERVICES [address] [place], [place], [address] Phone: [phone]   Fax:  [phone]  Physical Therapy Treatment  Patient Details  Name: Johnny [name]. MRN: [id number] Date of Birth: 02-Aug-1950 Referring Provider (PT): Eulogio Ditch, NP    Encounter Date: 09/19/2019  PT End of Session - 09/19/19 0851    Visit Number  26    Number of Visits  41    Date for PT Re-Evaluation  10/22/19    Authorization Type  [employer] Medicare    PT Start Time  0845    PT Stop Time  0925    PT Time Calculation (min)  40 min    Equipment Utilized During Treatment  Gait belt    Activity Tolerance  Patient tolerated treatment well;No increased pain    Behavior During Therapy  WFL for tasks assessed/performed       Past Medical History:  Diagnosis Date  . Anemia   . Anxiety   . CHF (congestive heart failure) ([place])   . Chronic kidney disease    esrd dialysis m/w/f  . Gout   . Hyperlipidemia   . Hypertension   . Myocardial infarction ([place]) 2010   10 years ago  . 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  . Peripheral vascular disease [facility])     Past Surgical History:  Procedure Laterality Date  . A/V FISTULAGRAM Right 09/06/2018   Procedure: A/V FISTULAGRAM;  Surgeon: Algernon Huxley, MD;  Location: [place] [facility];  Service: Cardiovascular;  Laterality: Right;  . A/V SHUNTOGRAM Left 06/21/2017   Procedure: A/V SHUNTOGRAM;  Surgeon: Katha Cabal, MD;  Location: [place] [facility];  Service: Cardiovascular;  Laterality: Left;  . A/V SHUNTOGRAM N/A 10/24/2018   Procedure: A/V SHUNTOGRAM;  Surgeon: Algernon Huxley, MD;  Location: [place] [facility] LAB;  Service: Cardiovascular;  Laterality: N/A;  . ABOVE KNEE LEG AMPUTATION Right 2020  . AMPUTATION Right 10/25/2018   Procedure: AMPUTATION ABOVE KNEE;  Surgeon: Algernon Huxley, MD;  Location: [facility] ORS;  Service: General;  Laterality: Right;  .  APPLICATION OF WOUND VAC Right 04/11/2018   Procedure: APPLICATION OF WOUND VAC;  Surgeon: Algernon Huxley, MD;  Location: [facility] ORS;  Service: Vascular;  Laterality: Right;  . AV FISTULA PLACEMENT Left 09/18/2015   Procedure: INSERTION OF ARTERIOVENOUS (AV) GORE-TEX GRAFT ARM ( BRACH/AXILLARY GRAFT W/ INSTANT STICK GRAFT );  Surgeon: Katha Cabal, MD;  Location: [facility] ORS;  Service: Vascular;  Laterality: Left;  . AV FISTULA PLACEMENT Right 07/19/2018   Procedure: INSERTION OF ARTERIOVENOUS (AV) GORE-TEX GRAFT ARM ( BRACHIAL AXILLARY);  Surgeon: Algernon Huxley, MD;  Location: [facility] ORS;  Service: Vascular;  Laterality: Right;  . DIALYSIS FISTULA CREATION Right 10/2017   right chest perm cath  . DIALYSIS/PERMA CATHETER REMOVAL N/A 09/13/2018   Procedure: DIALYSIS/PERMA CATHETER REMOVAL;  Surgeon: Algernon Huxley, MD;  Location: [place] [facility];  Service: Cardiovascular;  Laterality: N/A;  . ESOPHAGOGASTRODUODENOSCOPY N/A 12/19/2017   Procedure: ESOPHAGOGASTRODUODENOSCOPY (EGD);  Surgeon: Lin Landsman, MD;  Location: [facility] ENDOSCOPY;  Service: Gastroenterology;  Laterality: N/A;  . LOWER EXTREMITY ANGIOGRAPHY Left 11/16/2017   Procedure: LOWER EXTREMITY ANGIOGRAPHY;  Surgeon: Algernon Huxley, MD;  Location: [place] [facility];  Service: Cardiovascular;  Laterality: Left;  . LOWER EXTREMITY ANGIOGRAPHY Right 01/18/2018   Procedure: LOWER EXTREMITY ANGIOGRAPHY;  Surgeon: Algernon Huxley, MD;  Location: [place] INVASIVE CV  LAB;  Service: Cardiovascular;  Laterality: Right;  . LOWER EXTREMITY ANGIOGRAPHY Left 04/02/2018   Procedure: LOWER EXTREMITY ANGIOGRAPHY;  Surgeon: Algernon Huxley, MD;  Location: [place] [facility];  Service: Cardiovascular;  Laterality: Left;  . LOWER EXTREMITY ANGIOGRAPHY Right 04/09/2018   Procedure: Lower Extremity Angiography with possible intervention;  Surgeon: Algernon Huxley, MD;  Location: [place] [facility];  Service: Cardiovascular;  Laterality: Right;  . LOWER EXTREMITY  ANGIOGRAPHY Right 07/23/2018   Procedure: Lower Extremity Angiography;  Surgeon: Algernon Huxley, MD;  Location: [place] [facility];  Service: Cardiovascular;  Laterality: Right;  . LOWER EXTREMITY ANGIOGRAPHY Right 09/13/2018   Procedure: LOWER EXTREMITY ANGIOGRAPHY;  Surgeon: Algernon Huxley, MD;  Location: [place] [facility];  Service: Cardiovascular;  Laterality: Right;  . LOWER EXTREMITY VENOGRAPHY Right 09/13/2018   Procedure: LOWER EXTREMITY VENOGRAPHY;  Surgeon: Algernon Huxley, MD;  Location: [place] [facility] LAB;  Service: Cardiovascular;  Laterality: Right;  . PERIPHERAL VASCULAR CATHETERIZATION Left 09/01/2015   Procedure: A/V Shuntogram/Fistulagram;  Surgeon: Katha Cabal, MD;  Location: [place] [facility];  Service: Cardiovascular;  Laterality: Left;  . PERIPHERAL VASCULAR CATHETERIZATION N/A 09/30/2015   Procedure: A/V Shuntogram/Fistulagram with perm cathether removal;  Surgeon: Algernon Huxley, MD;  Location: [place] [facility];  Service: Cardiovascular;  Laterality: N/A;  . PERIPHERAL VASCULAR CATHETERIZATION Left 09/30/2015   Procedure: A/V Shunt Intervention;  Surgeon: Algernon Huxley, MD;  Location: [place] [facility] LAB;  Service: Cardiovascular;  Laterality: Left;  . PERIPHERAL VASCULAR CATHETERIZATION Left 12/03/2015   Procedure: Thrombectomy;  Surgeon: Algernon Huxley, MD;  Location: [place] [facility];  Service: Cardiovascular;  Laterality: Left;  . PERIPHERAL VASCULAR CATHETERIZATION Left 01/28/2016   Procedure: Thrombectomy;  Surgeon: Algernon Huxley, MD;  Location: [place] [facility];  Service: Cardiovascular;  Laterality: Left;  . PERIPHERAL VASCULAR CATHETERIZATION N/A 01/28/2016   Procedure: A/V Shuntogram/Fistulagram;  Surgeon: Algernon Huxley, MD;  Location: [place] [facility];  Service: Cardiovascular;  Laterality: N/A;  . SKIN SPLIT GRAFT Right 05/24/2018   Procedure: SKIN GRAFT SPLIT THICKNESS ( RIGHT CALF);  Surgeon: Algernon Huxley, MD;  Location: [facility] OR[facility];  Service: Vascular;   Laterality: Right;  . UPPER EXTREMITY ANGIOGRAPHY  10/24/2018   Procedure: Upper Extremity Angiography;  Surgeon: Algernon Huxley, MD;  Location: [place] [facility] LAB;  Service: Cardiovascular;;  . UPPER EXTREMITY ANGIOGRAPHY Right 02/14/2019   Procedure: UPPER EXTREMITY ANGIOGRAPHY;  Surgeon: Algernon Huxley, MD;  Location: [place] [facility];  Service: Cardiovascular;  Laterality: Right;  . WOUND DEBRIDEMENT Right 04/11/2018   Procedure: DEBRIDEMENT WOUND calf muscle and skin;  Surgeon: Algernon Huxley, MD;  Location: [facility] OR[facility];  Service: Vascular;  Laterality: Right;    There were no vitals filed for this visit.  Subjective Assessment - 09/19/19 0849    Subjective  Patient reports that he is doing well today, has been doing his exercises at home, marching and sleeping on his stomach. . He reports no stumbles or falls since last therapy session and states that he has been walking some at home. He denies any pain at rest upon arrival today. No specific questions or concerns currently.    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 Pt DC hospital to SNF for rehab. He has since been back at home. Pt uses a manual WC or power chair for daily mobility. Pt takes tranportation services  to HD MWF. Pt was recently fitted for his AKA prosthesis in Jan 2021.    Limitations  Standing;Walking;House hold activities    How long can you sit comfortably?  No difficulty    How long can you stand comfortably?  1 minute    How long can you walk comfortably?  unable    Currently in Pain?  No/denies    Multiple Pain Sites  No      Treatment: Patient educated about prone lying at home.  Patient performs prone R hip extension with 2/5 strength  Mobility training sit to supine and supine <> prone  Standing WB on on RLE with assist to keep Right hip in extension and knee in extension  sit to stand with Rw and R prosthesis x 5 mins- strap broke during standing  trying to get the stump to drop down into the socket                           PT Education - 09/19/19 0850    Education Details  exercise technique, safety    Person(s) Educated  Patient    Methods  Explanation    Comprehension  Verbalized understanding;Need further instruction       PT Short Term Goals - 08/29/19 0826      PT SHORT TERM GOAL #1   Title  Pt to demonstrate ability to perfrom STS transfer from chair c RW minGuard assist and lock his RLE knee joint without assist.    Baseline  ModA from elevated surface at eval; 08/29/19: minA+1 with BUE support from regular height chair with arm rests   Now able to perform with supervision from chair +2 airexpads.   Time  4    Period  Weeks    Status  Partially Met    Target Date  06/25/19      PT SHORT TERM GOAL #2   Title  Pt to demonstrate 5 degrees hip flexion 10 degrees hip abdct ROM.    Baseline  lacks 5 degrees from neutral in both at eval; At visit 10, has 0 degrees flexion/extension; still lacking 5 degrees ABDCT neutral; 08/29/19: R hip: lacking 5 degrees of extension, 15 degrees abduction    Time  4    Period  Weeks    Status  Achieved    Target Date  06/25/19        PT Long Term Goals - 08/29/19 0843      PT LONG TERM GOAL #1   Title  Pt to demonstrates 15 degrees Rt hip ABDCT, 10 degrees Rt hip extension P/ROM to facilitate prosthesis motor control.    Baseline  08/29/19: R hip: lacking 5 degrees of extension, 15 degrees abduction    Time  8    Period  Weeks    Status  Partially Met    Target Date  10/22/19      PT LONG TERM GOAL #2   Title  Pt to demonstrate 5/5 strength hip extension at 0 degrees flexion/extension    Baseline  has 5/5 in some ranges, but not in ranges specific to standing or gait.; 08/29/19: Unable to get to neutral flexion extension but 5/5 R hip extension in other ranges    Time  8    Period  Weeks    Status  On-going    Target Date  10/22/19      PT LONG TERM  GOAL #3   Title  Pt able to perfrom 5x STS from chair with RW, arms ad lib in <21sec    Baseline  At visit 10 requires chair +2 pads +RW; 08/29/19: minA+1 with BUE support from regular height chair with arm rests to stand, unable to perform 5TSTS    Time  8    Period  Weeks    Status  Partially Met    Target Date  10/22/19      PT LONG TERM GOAL #4   Title  Pt will be able to ambulate at least 100' with RW and supervision with proper locking of R prosthesis in order to demonstrate safe household ambulation in order to improve independence with gait without need for power wheelchair    Baseline  08/29/19: Pt has ambulated 86' with CGA and wheelchair follow with RW but episodes of buckling;    Time  12    Period  Weeks    Status  New    Target Date  10/22/19            Plan - 09/19/19 0944    Clinical Impression Statement  Patient was educated about HEP and demonstrated prone hip extension exercises with assist to perform correctly. Patient performed sit to stand transfers x 3 with WB on RLE with assist to keep R hip in extension. He is not able to get his RLE stump fully into his prosthesis and then his strap broke. He transferred scooter <> mat with sliding board. Patient needs to go see the prosthetist to have his prosthetic leg made bigger and to fix the strap. Patient will benefit from skilled PT to improve mobility and strength.     Personal Factors and Comorbidities  Age;Fitness;Past/Current Experience;Education;Transportation    Examination-Activity Limitations  Transfers;Dressing;Squat;Stairs;Stand;Bed Mobility    Examination-Participation Restrictions  Meal Prep;Cleaning;Community Activity;Driving;Yard Work;Laundry;Shop    Stability/Clinical Decision Making  Stable/Uncomplicated    Rehab Potential  Good    PT Frequency  2x / week    PT Duration  12 weeks    PT Treatment/Interventions  ADLs/Self Care Home Management;Cryotherapy;Electrical Stimulation;DME Instruction;Gait  training;Stair training;Functional mobility training;Therapeutic activities;Therapeutic exercise;Balance training;Neuromuscular re-education;Cognitive remediation;Patient/family education;Prosthetic Training;Wheelchair mobility training;Passive range of motion;Dry needling;Scar mobilization;Spinal Manipulations;Joint Manipulations    PT Next Visit Plan  Strength, balance, transfers, and gait training with CGA and wheelchair follow    PT Home Exercise Plan  Medbridge Access Code: [email or web address] (prone positioning), L single leg bridges, L sidelying R hip abduction, hip isometric adduction    Consulted and Agree with Plan of Care  Patient       Patient will benefit from skilled therapeutic intervention in order to improve the following deficits and impairments:  Decreased activity tolerance, Decreased balance, Decreased mobility, Decreased strength, Decreased cognition, Decreased knowledge of use of DME, Decreased knowledge of precautions, Decreased endurance, Decreased range of motion, Difficulty walking, Decreased safety awareness, Decreased skin integrity, Decreased scar mobility, Hypomobility, Postural dysfunction, Increased edema, Increased muscle spasms, Increased fascial restricitons, Prosthetic Dependency, Impaired tone, Improper body mechanics  Visit Diagnosis: Difficulty in walking, not elsewhere classified  Unsteadiness on feet     Problem List Patient Active Problem List   Diagnosis Date Noted  . Cardiomyopathy ([place]) 01/22/2019  . CHF (congestive heart failure) ([place]) 01/22/2019  . Coronary disease 01/22/2019  . Above knee amputation of right lower extremity ([place]) 11/13/2018  . Leg ulcer, right, with fat layer exposed ([place]) 10/22/2018  . Cellulitis of right leg 07/21/2018  . Lower limb ulcer, calf, right, limited to breakdown of  skin ([place]) 06/08/2018  . PVD (peripheral vascular disease) ([place]) 04/17/2018  . Malnutrition of moderate degree 04/11/2018  . Pressure injury of skin  04/06/2018  . Altered mental status 04/04/2018  . Hypothermia 04/04/2018  . Hemodialysis graft malfunction ([place]) 03/26/2018  . Hypercholesterolemia 02/27/2018  . Diabetes ([place]) 02/27/2018  . Weakness of right lower extremity 01/20/2018  . Fever   . Periumbilical abdominal pain   . Confusion 12/22/2017  . Acute delirium 12/21/2017  . Protein-calorie malnutrition, severe 12/19/2017  . Intractable nausea and vomiting 12/18/2017  . Lymphedema 12/13/2017  . Cellulitis 11/27/2017  . Chest pain 11/19/2017  . Atherosclerosis of native arteries of the extremities with ulceration ([place]) 11/07/2017  . Twitching 01/03/2017  . Elevated troponin 10/02/2015  . Complications, dialysis, catheter, mechanical ([place]) 10/02/2015  . Musculoskeletal chest pain 09/28/2015  . Chronic diastolic CHF (congestive heart failure) ([place]) 09/28/2015  . End stage renal disease ([place]) 10/09/2012  . Hypertension 10/09/2012  . Gout 10/09/2012    Alanson Puls, PT DTP 09/19/2019, 9:45 AM  [place] MAIN [facility] SERVICES [address] [place], [place], [address] Phone: [phone]   Fax:  [phone]  Name: Johnny [name]. MRN: [id number] Date of Birth: [birth year]

## 2019-09-24 ENCOUNTER — Other Ambulatory Visit: Payer: Self-pay

## 2019-09-24 ENCOUNTER — Ambulatory Visit: Payer: Medicare Other

## 2019-09-24 DIAGNOSIS — R2681 Unsteadiness on feet: Secondary | ICD-10-CM

## 2019-09-24 DIAGNOSIS — R262 Difficulty in walking, not elsewhere classified: Secondary | ICD-10-CM | POA: Diagnosis not present

## 2019-09-24 NOTE — Therapy (Signed)
[place] MAIN [facility] SERVICES [address] [place], [place], [address] Phone: [phone]   Fax:  [phone]  Physical Therapy Treatment  Patient Details  Name: Johnny [name]. MRN: [id number] Date of Birth: 1950-08-23 Referring Provider (PT): Eulogio Ditch, NP    Encounter Date: 09/24/2019  PT End of Session - 09/24/19 1142    Visit Number  27    Number of Visits  41    Date for PT Re-Evaluation  10/22/19    Authorization Type  [employer] Medicare    PT Start Time  0845    PT Stop Time  0930    PT Time Calculation (min)  45 min    Equipment Utilized During Treatment  Gait belt    Activity Tolerance  Patient tolerated treatment well;No increased pain    Behavior During Therapy  WFL for tasks assessed/performed       Past Medical History:  Diagnosis Date  . Anemia   . Anxiety   . CHF (congestive heart failure) ([place])   . Chronic kidney disease    esrd dialysis m/w/f  . Gout   . Hyperlipidemia   . Hypertension   . Myocardial infarction ([place]) 2010   10 years ago  . 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  . Peripheral vascular disease [facility])     Past Surgical History:  Procedure Laterality Date  . A/V FISTULAGRAM Right 09/06/2018   Procedure: A/V FISTULAGRAM;  Surgeon: Algernon Huxley, MD;  Location: [place] CV LAB;  Service: Cardiovascular;  Laterality: Right;  . A/V SHUNTOGRAM Left 06/21/2017   Procedure: A/V SHUNTOGRAM;  Surgeon: Katha Cabal, MD;  Location: [place] CV LAB;  Service: Cardiovascular;  Laterality: Left;  . A/V SHUNTOGRAM N/A 10/24/2018   Procedure: A/V SHUNTOGRAM;  Surgeon: Algernon Huxley, MD;  Location: [place] CV LAB;  Service: Cardiovascular;  Laterality: N/A;  . ABOVE KNEE LEG AMPUTATION Right 2020  . AMPUTATION Right 10/25/2018   Procedure: AMPUTATION ABOVE KNEE;  Surgeon: Algernon Huxley, MD;  Location: [facility] ORS;  Service: General;  Laterality: Right;  .  APPLICATION OF WOUND VAC Right 04/11/2018   Procedure: APPLICATION OF WOUND VAC;  Surgeon: Algernon Huxley, MD;  Location: [facility] ORS;  Service: Vascular;  Laterality: Right;  . AV FISTULA PLACEMENT Left 09/18/2015   Procedure: INSERTION OF ARTERIOVENOUS (AV) GORE-TEX GRAFT ARM ( BRACH/AXILLARY GRAFT W/ INSTANT STICK GRAFT );  Surgeon: Katha Cabal, MD;  Location: [facility] ORS;  Service: Vascular;  Laterality: Left;  . AV FISTULA PLACEMENT Right 07/19/2018   Procedure: INSERTION OF ARTERIOVENOUS (AV) GORE-TEX GRAFT ARM ( BRACHIAL AXILLARY);  Surgeon: Algernon Huxley, MD;  Location: [facility] ORS;  Service: Vascular;  Laterality: Right;  . DIALYSIS FISTULA CREATION Right 10/2017   right chest perm cath  . DIALYSIS/PERMA CATHETER REMOVAL N/A 09/13/2018   Procedure: DIALYSIS/PERMA CATHETER REMOVAL;  Surgeon: Algernon Huxley, MD;  Location: [place] CV LAB;  Service: Cardiovascular;  Laterality: N/A;  . ESOPHAGOGASTRODUODENOSCOPY N/A 12/19/2017   Procedure: ESOPHAGOGASTRODUODENOSCOPY (EGD);  Surgeon: Lin Landsman, MD;  Location: [facility] ENDOSCOPY;  Service: Gastroenterology;  Laterality: N/A;  . LOWER EXTREMITY ANGIOGRAPHY Left 11/16/2017   Procedure: LOWER EXTREMITY ANGIOGRAPHY;  Surgeon: Algernon Huxley, MD;  Location: [place] CV LAB;  Service: Cardiovascular;  Laterality: Left;  . LOWER EXTREMITY ANGIOGRAPHY Right 01/18/2018   Procedure: LOWER EXTREMITY ANGIOGRAPHY;  Surgeon: Algernon Huxley, MD;  Location: [place] INVASIVE CV  LAB;  Service: Cardiovascular;  Laterality: Right;  . LOWER EXTREMITY ANGIOGRAPHY Left 04/02/2018   Procedure: LOWER EXTREMITY ANGIOGRAPHY;  Surgeon: Algernon Huxley, MD;  Location: [place] CV LAB;  Service: Cardiovascular;  Laterality: Left;  . LOWER EXTREMITY ANGIOGRAPHY Right 04/09/2018   Procedure: Lower Extremity Angiography with possible intervention;  Surgeon: Algernon Huxley, MD;  Location: [place] CV LAB;  Service: Cardiovascular;  Laterality: Right;  . LOWER EXTREMITY  ANGIOGRAPHY Right 07/23/2018   Procedure: Lower Extremity Angiography;  Surgeon: Algernon Huxley, MD;  Location: [place] CV LAB;  Service: Cardiovascular;  Laterality: Right;  . LOWER EXTREMITY ANGIOGRAPHY Right 09/13/2018   Procedure: LOWER EXTREMITY ANGIOGRAPHY;  Surgeon: Algernon Huxley, MD;  Location: [place] [facility] LAB;  Service: Cardiovascular;  Laterality: Right;  . LOWER EXTREMITY VENOGRAPHY Right 09/13/2018   Procedure: LOWER EXTREMITY VENOGRAPHY;  Surgeon: Algernon Huxley, MD;  Location: [place] CV LAB;  Service: Cardiovascular;  Laterality: Right;  . PERIPHERAL VASCULAR CATHETERIZATION Left 09/01/2015   Procedure: A/V Shuntogram/Fistulagram;  Surgeon: Katha Cabal, MD;  Location: [place] [facility] LAB;  Service: Cardiovascular;  Laterality: Left;  . PERIPHERAL VASCULAR CATHETERIZATION N/A 09/30/2015   Procedure: A/V Shuntogram/Fistulagram with perm cathether removal;  Surgeon: Algernon Huxley, MD;  Location: [place] [facility] LAB;  Service: Cardiovascular;  Laterality: N/A;  . PERIPHERAL VASCULAR CATHETERIZATION Left 09/30/2015   Procedure: A/V Shunt Intervention;  Surgeon: Algernon Huxley, MD;  Location: [place] CV LAB;  Service: Cardiovascular;  Laterality: Left;  . PERIPHERAL VASCULAR CATHETERIZATION Left 12/03/2015   Procedure: Thrombectomy;  Surgeon: Algernon Huxley, MD;  Location: [place] [facility];  Service: Cardiovascular;  Laterality: Left;  . PERIPHERAL VASCULAR CATHETERIZATION Left 01/28/2016   Procedure: Thrombectomy;  Surgeon: Algernon Huxley, MD;  Location: [place] [facility] LAB;  Service: Cardiovascular;  Laterality: Left;  . PERIPHERAL VASCULAR CATHETERIZATION N/A 01/28/2016   Procedure: A/V Shuntogram/Fistulagram;  Surgeon: Algernon Huxley, MD;  Location: [place] [facility] LAB;  Service: Cardiovascular;  Laterality: N/A;  . SKIN SPLIT GRAFT Right 05/24/2018   Procedure: SKIN GRAFT SPLIT THICKNESS ( RIGHT CALF);  Surgeon: Algernon Huxley, MD;  Location: [facility] ORS;  Service: Vascular;   Laterality: Right;  . UPPER EXTREMITY ANGIOGRAPHY  10/24/2018   Procedure: Upper Extremity Angiography;  Surgeon: Algernon Huxley, MD;  Location: [place] CV LAB;  Service: Cardiovascular;;  . UPPER EXTREMITY ANGIOGRAPHY Right 02/14/2019   Procedure: UPPER EXTREMITY ANGIOGRAPHY;  Surgeon: Algernon Huxley, MD;  Location: [place] CV LAB;  Service: Cardiovascular;  Laterality: Right;  . WOUND DEBRIDEMENT Right 04/11/2018   Procedure: DEBRIDEMENT WOUND calf muscle and skin;  Surgeon: Algernon Huxley, MD;  Location: [facility] ORS;  Service: Vascular;  Laterality: Right;    There were no vitals filed for this visit.  Subjective Assessment - 09/24/19 0843    Subjective  Patient reports that he is doing well today, has been doing his exercises at home, marching and sleeping on his stomach. . He reports no stumbles or falls since last therapy session and states that he has been walking some at home. He denies any pain at rest upon arrival today. No specific questions or concerns currently.    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 Pt DC hospital to SNF for rehab. He has since been back at home. Pt uses a manual WC or power chair for daily mobility. Pt takes tranportation services  to HD MWF. Pt was recently fitted for his AKA prosthesis in Jan 2021.    Limitations  Standing;Walking;House hold activities    How long can you sit comfortably?  No difficulty    How long can you stand comfortably?  1 minute    How long can you walk comfortably?  unable    Currently in Pain?  No/denies             TREATMENT   Ther-ex Initial sit to stand from elevated mat table in order for therapist to assist in seated RLE farther down into socket Sit to stand in // bars with BUE support, practicing locking R knee and balancing once standing x 10; Seated marchesx 20bilateral; Seated clams with green tbandx 20bilateral; Seated adductor squeeze  with manual resistancex 20 bilateral; Seated L LAQ with manual resistance from therapist x 20; Seated L HS curls with manual resistance x 20;   Gait Training Performed gait training with patientusing rolling walker.Minimal education today required with patient regarding gait sequencing including advancing walker,unlocking R knee, utilizing momentum to create heel strike on the right, and to lock out right knee. He demonstrates improved independence today and he does not experience any buckles today. Improved R heel strike noted today and overall speed is improved as well. Practiced partial step-through gait with patient today and it is improved from last session.CGAand wheelchair follow. He requires some cues for upright posture but less than previous session.Marland KitchenHe is able to ambulate150' x 2 completing two full laps around the rehab gym/hallway.He continues to demonstrateheavy BUE support on walker handles during gait. Fatigue monitored by therapist throughout and one seated rest breaks provided between reps.   Pt educated throughout session about proper posture and technique with exercises. Improved exercise technique, movement at target joints, use of target muscles after min to mod verbal, visual, tactile cues.   Ptis making excellent progress with therapy and continues to demonstrate excellent motivation. He is able to complete all of his seated and standing exercises with therapy today and continued to progress strengthening to encourage more R single leg stance. Performed gait training with patientusing rolling walker.Minimal education today required with patient regarding gait sequencing including advancing walker,unlocking R knee, utilizing momentum to create heel strike on the right, and to lock out right knee. He demonstrates improved independence today and he does not experience any buckles today. Improved R heel strike noted today and overall speed is improved as well.  Practiced partial step-through gait with patient today and it is improved from last session.CGAand wheelchair follow. He requires some cues for upright posture but less than previous session.Marland KitchenHe is able to ambulate150' x 2 completing two full laps around the rehab gym/hallway.He continues to demonstrateheavy BUE support on walker handles during gait. Fatigue monitored by therapist throughout and one seated rest breaks provided between reps. Pt encouraged to continue HEP especially prone stretching. Hewill benefit from PT services to address deficits in strength, balance, and mobility in order to return to full function at home.                                    PT Short Term Goals - 08/29/19 5053      PT SHORT TERM GOAL #1   Title  Pt to demonstrate ability to perfrom STS transfer from chair c RW minGuard assist and lock his RLE knee joint without assist.    Baseline  ModA from elevated surface at eval; 08/29/19: minA+1 with BUE support from regular height chair with arm rests   Now able to perform with supervision from chair +2 airexpads.   Time  4    Period  Weeks    Status  Partially Met    Target Date  06/25/19      PT SHORT TERM GOAL #2   Title  Pt to demonstrate 5 degrees hip flexion 10 degrees hip abdct ROM.    Baseline  lacks 5 degrees from neutral in both at eval; At visit 10, has 0 degrees flexion/extension; still lacking 5 degrees ABDCT neutral; 08/29/19: R hip: lacking 5 degrees of extension, 15 degrees abduction    Time  4    Period  Weeks    Status  Achieved    Target Date  06/25/19        PT Long Term Goals - 08/29/19 0843      PT LONG TERM GOAL #1   Title  Pt to demonstrates 15 degrees Rt hip ABDCT, 10 degrees Rt hip extension P/ROM to facilitate prosthesis motor control.    Baseline  08/29/19: R hip: lacking 5 degrees of extension, 15 degrees abduction    Time  8    Period  Weeks    Status  Partially Met    Target Date  10/22/19       PT LONG TERM GOAL #2   Title  Pt to demonstrate 5/5 strength hip extension at 0 degrees flexion/extension    Baseline  has 5/5 in some ranges, but not in ranges specific to standing or gait.; 08/29/19: Unable to get to neutral flexion extension but 5/5 R hip extension in other ranges    Time  8    Period  Weeks    Status  On-going    Target Date  10/22/19      PT LONG TERM GOAL #3   Title  Pt able to perfrom 5x STS from chair with RW, arms ad lib in <21sec    Baseline  At visit 10 requires chair +2 pads +RW; 08/29/19: minA+1 with BUE support from regular height chair with arm rests to stand, unable to perform 5TSTS    Time  8    Period  Weeks    Status  Partially Met    Target Date  10/22/19      PT LONG TERM GOAL #4   Title  Pt will be able to ambulate at least 100' with RW and supervision with proper locking of R prosthesis in order to demonstrate safe household ambulation in order to improve independence with gait without need for power wheelchair    Baseline  08/29/19: Pt has ambulated 28' with CGA and wheelchair follow with RW but episodes of buckling;    Time  12    Period  Weeks    Status  New    Target Date  10/22/19            Plan - 09/24/19 1138    Clinical Impression Statement  Pt is making excellent progress with therapy and continues to demonstrate excellent motivation. He is able to complete all of his seated and standing exercises with therapy today and continued to progress strengthening to encourage more R single leg stance. Performed gait training with patient using rolling walker. Minimal education today required with patient regarding gait sequencing including advancing walker, unlocking R knee, utilizing momentum to create heel strike on the right, and to lock  out right knee. He demonstrates improved independence today and he does not experience any buckles today. Improved R heel strike noted today and overall speed is improved as well. Practiced partial  step-through gait with patient today and it is improved from last session. CGA and wheelchair follow. He requires some cues for upright posture but less than previous session.[name] He is able to ambulate 150' x 2 completing two full laps around the rehab gym/hallway. He continues to demonstrate heavy BUE support on walker handles during gait. Fatigue monitored by therapist throughout and one seated rest breaks provided between reps. Pt encouraged to continue HEP especially prone stretching. He will benefit from PT services to address deficits in strength, balance, and mobility in order to return to full function at home.    Personal Factors and Comorbidities  Age;Fitness;Past/Current Experience;Education;Transportation    Examination-Activity Limitations  Transfers;Dressing;Squat;Stairs;Stand;Bed Mobility    Examination-Participation Restrictions  Meal Prep;Cleaning;Community Activity;Driving;Yard Work;Laundry;Shop    Stability/Clinical Decision Making  Stable/Uncomplicated    Rehab Potential  Good    PT Frequency  2x / week    PT Duration  12 weeks    PT Treatment/Interventions  ADLs/Self Care Home Management;Cryotherapy;Electrical Stimulation;DME Instruction;Gait training;Stair training;Functional mobility training;Therapeutic activities;Therapeutic exercise;Balance training;Neuromuscular re-education;Cognitive remediation;Patient/family education;Prosthetic Training;Wheelchair mobility training;Passive range of motion;Dry needling;Scar mobilization;Spinal Manipulations;Joint Manipulations    PT Next Visit Plan  Strength, balance, transfers, and gait training with CGA and wheelchair follow    PT Home Exercise Plan  Medbridge Access Code: [email or web address] (prone positioning), L single leg bridges, L sidelying R hip abduction, hip isometric adduction    Consulted and Agree with Plan of Care  Patient       Patient will benefit from skilled therapeutic intervention in order to improve the following deficits and  impairments:  Decreased activity tolerance, Decreased balance, Decreased mobility, Decreased strength, Decreased cognition, Decreased knowledge of use of DME, Decreased knowledge of precautions, Decreased endurance, Decreased range of motion, Difficulty walking, Decreased safety awareness, Decreased skin integrity, Decreased scar mobility, Hypomobility, Postural dysfunction, Increased edema, Increased muscle spasms, Increased fascial restricitons, Prosthetic Dependency, Impaired tone, Improper body mechanics  Visit Diagnosis: Difficulty in walking, not elsewhere classified  Unsteadiness on feet     Problem List Patient Active Problem List   Diagnosis Date Noted  . Cardiomyopathy ([place]) 01/22/2019  . CHF (congestive heart failure) ([place]) 01/22/2019  . Coronary disease 01/22/2019  . Above knee amputation of right lower extremity ([place]) 11/13/2018  . Leg ulcer, right, with fat layer exposed ([place]) 10/22/2018  . Cellulitis of right leg 07/21/2018  . Lower limb ulcer, calf, right, limited to breakdown of skin ([place]) 06/08/2018  . PVD (peripheral vascular disease) ([place]) 04/17/2018  . Malnutrition of moderate degree 04/11/2018  . Pressure injury of skin 04/06/2018  . Altered mental status 04/04/2018  . Hypothermia 04/04/2018  . Hemodialysis graft malfunction ([place]) 03/26/2018  . Hypercholesterolemia 02/27/2018  . Diabetes ([place]) 02/27/2018  . Weakness of right lower extremity 01/20/2018  . Fever   . Periumbilical abdominal pain   . Confusion 12/22/2017  . Acute delirium 12/21/2017  . Protein-calorie malnutrition, severe 12/19/2017  . Intractable nausea and vomiting 12/18/2017  . Lymphedema 12/13/2017  . Cellulitis 11/27/2017  . Chest pain 11/19/2017  . Atherosclerosis of native arteries of the extremities with ulceration ([place]) 11/07/2017  . Twitching 01/03/2017  . Elevated troponin 10/02/2015  . Complications, dialysis, catheter, mechanical ([place]) 10/02/2015  . Musculoskeletal chest pain  09/28/2015  . Chronic diastolic CHF (congestive heart failure) ([place]) 09/28/2015  .  End stage renal disease ([place]) 10/09/2012  . Hypertension 10/09/2012  . Gout 10/09/2012   Phillips Grout PT, DPT, GCS  Kahlani Graber 09/24/2019, 11:44 AM  [place] MAIN [facility] SERVICES [address] [place], [place], [address] Phone: [phone]   Fax:  [phone]  Name: Chirstopher Iovino. MRN: [id number] Date of Birth: 08/22/1950

## 2019-09-26 ENCOUNTER — Encounter: Payer: Self-pay | Admitting: Physical Therapy

## 2019-09-26 ENCOUNTER — Other Ambulatory Visit: Payer: Self-pay

## 2019-09-26 ENCOUNTER — Ambulatory Visit: Payer: Medicare Other | Admitting: Physical Therapy

## 2019-09-26 DIAGNOSIS — R262 Difficulty in walking, not elsewhere classified: Secondary | ICD-10-CM

## 2019-09-26 DIAGNOSIS — R2681 Unsteadiness on feet: Secondary | ICD-10-CM

## 2019-09-26 NOTE — Therapy (Signed)
[place] MAIN [facility] SERVICES [address] [place], [place], [address] Phone: [phone]   Fax:  [phone]  Physical Therapy Treatment  Patient Details  Name: Johnny [name]. MRN: [id number] Date of Birth: [birth year] Referring Provider (PT): Eulogio Ditch, NP    Encounter Date: 09/26/2019  PT End of Session - 09/26/19 0857    Visit Number  28    Number of Visits  41    Date for PT Re-Evaluation  10/22/19    Authorization Type  [employer] Medicare    PT Start Time  0840    PT Stop Time  0920    PT Time Calculation (min)  40 min    Equipment Utilized During Treatment  Gait belt    Activity Tolerance  Patient tolerated treatment well;No increased pain    Behavior During Therapy  WFL for tasks assessed/performed       Past Medical History:  Diagnosis Date  . Anemia   . Anxiety   . CHF (congestive heart failure) ([place])   . Chronic kidney disease    esrd dialysis m/w/f  . Gout   . Hyperlipidemia   . Hypertension   . Myocardial infarction ([place]) 2010   10 years ago  . 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  . Peripheral vascular disease [facility])     Past Surgical History:  Procedure Laterality Date  . A/V FISTULAGRAM Right 09/06/2018   Procedure: A/V FISTULAGRAM;  Surgeon: Algernon Huxley, MD;  Location: [place] [facility];  Service: Cardiovascular;  Laterality: Right;  . A/V SHUNTOGRAM Left 06/21/2017   Procedure: A/V SHUNTOGRAM;  Surgeon: Katha Cabal, MD;  Location: [place] [facility];  Service: Cardiovascular;  Laterality: Left;  . A/V SHUNTOGRAM N/A 10/24/2018   Procedure: A/V SHUNTOGRAM;  Surgeon: Algernon Huxley, MD;  Location: [place] [facility];  Service: Cardiovascular;  Laterality: N/A;  . ABOVE KNEE LEG AMPUTATION Right 2020  . AMPUTATION Right 10/25/2018   Procedure: AMPUTATION ABOVE KNEE;  Surgeon: Algernon Huxley, MD;  Location: [facility] OR[facility];  Service: General;  Laterality: Right;  .  APPLICATION OF WOUND VAC Right 04/11/2018   Procedure: APPLICATION OF WOUND VAC;  Surgeon: Algernon Huxley, MD;  Location: [facility] OR[facility];  Service: Vascular;  Laterality: Right;  . AV FISTULA PLACEMENT Left 09/18/2015   Procedure: INSERTION OF ARTERIOVENOUS (AV) GORE-TEX GRAFT ARM ( BRACH/AXILLARY GRAFT W/ INSTANT STICK GRAFT );  Surgeon: Katha Cabal, MD;  Location: [facility] OR[facility];  Service: Vascular;  Laterality: Left;  . AV FISTULA PLACEMENT Right 07/19/2018   Procedure: INSERTION OF ARTERIOVENOUS (AV) GORE-TEX GRAFT ARM ( BRACHIAL AXILLARY);  Surgeon: Algernon Huxley, MD;  Location: [facility];  Service: Vascular;  Laterality: Right;  . DIALYSIS FISTULA CREATION Right 10/2017   right chest perm cath  . DIALYSIS/PERMA CATHETER REMOVAL N/A 09/13/2018   Procedure: DIALYSIS/PERMA CATHETER REMOVAL;  Surgeon: Algernon Huxley, MD;  Location: [place] [facility];  Service: Cardiovascular;  Laterality: N/A;  . ESOPHAGOGASTRODUODENOSCOPY N/A 12/19/2017   Procedure: ESOPHAGOGASTRODUODENOSCOPY (EGD);  Surgeon: Lin Landsman, MD;  Location: [facility] ENDOSCOPY;  Service: Gastroenterology;  Laterality: N/A;  . LOWER EXTREMITY ANGIOGRAPHY Left 11/16/2017   Procedure: LOWER EXTREMITY ANGIOGRAPHY;  Surgeon: Algernon Huxley, MD;  Location: [place] [facility];  Service: Cardiovascular;  Laterality: Left;  . LOWER EXTREMITY ANGIOGRAPHY Right 01/18/2018   Procedure: LOWER EXTREMITY ANGIOGRAPHY;  Surgeon: Algernon Huxley, MD;  Location: [place] INVASIVE CV  LAB;  Service: Cardiovascular;  Laterality: Right;  . LOWER EXTREMITY ANGIOGRAPHY Left 04/02/2018   Procedure: LOWER EXTREMITY ANGIOGRAPHY;  Surgeon: Algernon Huxley, MD;  Location: [place] [facility];  Service: Cardiovascular;  Laterality: Left;  . LOWER EXTREMITY ANGIOGRAPHY Right 04/09/2018   Procedure: Lower Extremity Angiography with possible intervention;  Surgeon: Algernon Huxley, MD;  Location: [place] [facility];  Service: Cardiovascular;  Laterality: Right;  . LOWER EXTREMITY  ANGIOGRAPHY Right 07/23/2018   Procedure: Lower Extremity Angiography;  Surgeon: Algernon Huxley, MD;  Location: [place] [facility];  Service: Cardiovascular;  Laterality: Right;  . LOWER EXTREMITY ANGIOGRAPHY Right 09/13/2018   Procedure: LOWER EXTREMITY ANGIOGRAPHY;  Surgeon: Algernon Huxley, MD;  Location: [place] [facility];  Service: Cardiovascular;  Laterality: Right;  . LOWER EXTREMITY VENOGRAPHY Right 09/13/2018   Procedure: LOWER EXTREMITY VENOGRAPHY;  Surgeon: Algernon Huxley, MD;  Location: [place] [facility];  Service: Cardiovascular;  Laterality: Right;  . PERIPHERAL VASCULAR CATHETERIZATION Left 09/01/2015   Procedure: A/V Shuntogram/Fistulagram;  Surgeon: Katha Cabal, MD;  Location: [place] [facility];  Service: Cardiovascular;  Laterality: Left;  . PERIPHERAL VASCULAR CATHETERIZATION N/A 09/30/2015   Procedure: A/V Shuntogram/Fistulagram with perm cathether removal;  Surgeon: Algernon Huxley, MD;  Location: [place] [facility];  Service: Cardiovascular;  Laterality: N/A;  . PERIPHERAL VASCULAR CATHETERIZATION Left 09/30/2015   Procedure: A/V Shunt Intervention;  Surgeon: Algernon Huxley, MD;  Location: [place] [facility];  Service: Cardiovascular;  Laterality: Left;  . PERIPHERAL VASCULAR CATHETERIZATION Left 12/03/2015   Procedure: Thrombectomy;  Surgeon: Algernon Huxley, MD;  Location: [place] [facility];  Service: Cardiovascular;  Laterality: Left;  . PERIPHERAL VASCULAR CATHETERIZATION Left 01/28/2016   Procedure: Thrombectomy;  Surgeon: Algernon Huxley, MD;  Location: [place] [facility];  Service: Cardiovascular;  Laterality: Left;  . PERIPHERAL VASCULAR CATHETERIZATION N/A 01/28/2016   Procedure: A/V Shuntogram/Fistulagram;  Surgeon: Algernon Huxley, MD;  Location: [place] [facility];  Service: Cardiovascular;  Laterality: N/A;  . SKIN SPLIT GRAFT Right 05/24/2018   Procedure: SKIN GRAFT SPLIT THICKNESS ( RIGHT CALF);  Surgeon: Algernon Huxley, MD;  Location: [facility];  Service: Vascular;   Laterality: Right;  . UPPER EXTREMITY ANGIOGRAPHY  10/24/2018   Procedure: Upper Extremity Angiography;  Surgeon: Algernon Huxley, MD;  Location: [place] [facility];  Service: Cardiovascular;;  . UPPER EXTREMITY ANGIOGRAPHY Right 02/14/2019   Procedure: UPPER EXTREMITY ANGIOGRAPHY;  Surgeon: Algernon Huxley, MD;  Location: [place] [facility];  Service: Cardiovascular;  Laterality: Right;  . WOUND DEBRIDEMENT Right 04/11/2018   Procedure: DEBRIDEMENT WOUND calf muscle and skin;  Surgeon: Algernon Huxley, MD;  Location: [facility];  Service: Vascular;  Laterality: Right;    There were no vitals filed for this visit.  Subjective Assessment - 09/26/19 0855    Subjective  Patient reports that he is doing well today, has been doing his exercises at home, marching and sleeping on his stomach. . He reports no stumbles or falls since last therapy session and states that he has been standing  at home. He denies any pain at rest upon arrival today. No specific questions or concerns currently.    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 Pt DC hospital to SNF for rehab. He has since been back at home. Pt uses a manual WC or power chair for daily mobility. Pt takes tranportation services  to HD MWF. Pt was recently fitted for his AKA prosthesis in Jan 2021.    Limitations  Standing;Walking;House hold activities    How long can you sit comfortably?  No difficulty    How long can you stand comfortably?  1 minute    How long can you walk comfortably?  unable    Currently in Pain?  No/denies    Multiple Pain Sites  No       Treatment: Gait training with RLE prosthesis and parallel bars x 80 feet, 40 feet , 40 feet with cGA and cues for upright posture.  Patient has flexed trunk and hips in standing  Patient performed with instruction, verbal cues, tactile cues of therapist: goal: increase tissue extensibility, promote proper posture, improve  mobility                      PT Education - 09/26/19 0856    Education Details  saety with transfers    Person(s) Educated  Patient    Methods  Explanation    Comprehension  Verbalized understanding       PT Short Term Goals - 08/29/19 0826      PT SHORT TERM GOAL #1   Title  Pt to demonstrate ability to perfrom STS transfer from chair c RW minGuard assist and lock his RLE knee joint without assist.    Baseline  ModA from elevated surface at eval; 08/29/19: minA+1 with BUE support from regular height chair with arm rests   Now able to perform with supervision from chair +2 airexpads.   Time  4    Period  Weeks    Status  Partially Met    Target Date  06/25/19      PT SHORT TERM GOAL #2   Title  Pt to demonstrate 5 degrees hip flexion 10 degrees hip abdct ROM.    Baseline  lacks 5 degrees from neutral in both at eval; At visit 10, has 0 degrees flexion/extension; still lacking 5 degrees ABDCT neutral; 08/29/19: R hip: lacking 5 degrees of extension, 15 degrees abduction    Time  4    Period  Weeks    Status  Achieved    Target Date  06/25/19        PT Long Term Goals - 08/29/19 0843      PT LONG TERM GOAL #1   Title  Pt to demonstrates 15 degrees Rt hip ABDCT, 10 degrees Rt hip extension P/ROM to facilitate prosthesis motor control.    Baseline  08/29/19: R hip: lacking 5 degrees of extension, 15 degrees abduction    Time  8    Period  Weeks    Status  Partially Met    Target Date  10/22/19      PT LONG TERM GOAL #2   Title  Pt to demonstrate 5/5 strength hip extension at 0 degrees flexion/extension    Baseline  has 5/5 in some ranges, but not in ranges specific to standing or gait.; 08/29/19: Unable to get to neutral flexion extension but 5/5 R hip extension in other ranges    Time  8    Period  Weeks    Status  On-going    Target Date  10/22/19      PT LONG TERM GOAL #3   Title  Pt able to perfrom 5x STS from chair with RW, arms ad lib in <21sec     Baseline  At visit 10 requires chair +  2 pads +RW; 08/29/19: minA+1 with BUE support from regular height chair with arm rests to stand, unable to perform 5TSTS    Time  8    Period  Weeks    Status  Partially Met    Target Date  10/22/19      PT LONG TERM GOAL #4   Title  Pt will be able to ambulate at least 100' with RW and supervision with proper locking of R prosthesis in order to demonstrate safe household ambulation in order to improve independence with gait without need for power wheelchair    Baseline  08/29/19: Pt has ambulated 73' with CGA and wheelchair follow with RW but episodes of buckling;    Time  12    Period  Weeks    Status  New    Target Date  10/22/19            Plan - 09/26/19 0857    Clinical Impression Statement  Patient performs gait training with R LE prosthesis in parallel bars with cues for upright posture and sequencing for gait. Patient has fatigue with standing and gait and is getting better with locking the R knee before weight shifting over on the RLE. Patient will continue to benefit from skilled PT to improve mobility and strength.    Personal Factors and Comorbidities  Age;Fitness;Past/Current Experience;Education;Transportation    Examination-Activity Limitations  Transfers;Dressing;Squat;Stairs;Stand;Bed Mobility    Examination-Participation Restrictions  Meal Prep;Cleaning;Community Activity;Driving;Yard Work;Laundry;Shop    Stability/Clinical Decision Making  Stable/Uncomplicated    Rehab Potential  Good    PT Frequency  2x / week    PT Duration  12 weeks    PT Treatment/Interventions  ADLs/Self Care Home Management;Cryotherapy;Electrical Stimulation;DME Instruction;Gait training;Stair training;Functional mobility training;Therapeutic activities;Therapeutic exercise;Balance training;Neuromuscular re-education;Cognitive remediation;Patient/family education;Prosthetic Training;Wheelchair mobility training;Passive range of motion;Dry needling;Scar  mobilization;Spinal Manipulations;Joint Manipulations    PT Next Visit Plan  Strength, balance, transfers, and gait training with CGA and wheelchair follow    PT Home Exercise Plan  Medbridge Access Code: [email or web address] (prone positioning), L single leg bridges, L sidelying R hip abduction, hip isometric adduction    Consulted and Agree with Plan of Care  Patient       Patient will benefit from skilled therapeutic intervention in order to improve the following deficits and impairments:  Decreased activity tolerance, Decreased balance, Decreased mobility, Decreased strength, Decreased cognition, Decreased knowledge of use of DME, Decreased knowledge of precautions, Decreased endurance, Decreased range of motion, Difficulty walking, Decreased safety awareness, Decreased skin integrity, Decreased scar mobility, Hypomobility, Postural dysfunction, Increased edema, Increased muscle spasms, Increased fascial restricitons, Prosthetic Dependency, Impaired tone, Improper body mechanics  Visit Diagnosis: Difficulty in walking, not elsewhere classified  Unsteadiness on feet     Problem List Patient Active Problem List   Diagnosis Date Noted  . Cardiomyopathy ([place]) 01/22/2019  . CHF (congestive heart failure) ([place]) 01/22/2019  . Coronary disease 01/22/2019  . Above knee amputation of right lower extremity ([place]) 11/13/2018  . Leg ulcer, right, with fat layer exposed ([place]) 10/22/2018  . Cellulitis of right leg 07/21/2018  . Lower limb ulcer, calf, right, limited to breakdown of skin ([place]) 06/08/2018  . PVD (peripheral vascular disease) ([place]) 04/17/2018  . Malnutrition of moderate degree 04/11/2018  . Pressure injury of skin 04/06/2018  . Altered mental status 04/04/2018  . Hypothermia 04/04/2018  . Hemodialysis graft malfunction ([place]) 03/26/2018  . Hypercholesterolemia 02/27/2018  . Diabetes ([place]) 02/27/2018  . Weakness of right lower extremity 01/20/2018  . Fever   .  Periumbilical abdominal pain    . Confusion 12/22/2017  . Acute delirium 12/21/2017  . Protein-calorie malnutrition, severe 12/19/2017  . Intractable nausea and vomiting 12/18/2017  . Lymphedema 12/13/2017  . Cellulitis 11/27/2017  . Chest pain 11/19/2017  . 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 Twitching 01/03/2017  . Elevated troponin 10/02/2015  . Complications, dialysis, catheter, mechanical ([place]) 10/02/2015  . Musculoskeletal chest pain 09/28/2015  . Chronic diastolic CHF (congestive heart failure) ([place]) 09/28/2015  . End stage renal disease ([place]) 10/09/2012  . Hypertension 10/09/2012  . Gout 10/09/2012    Alanson Puls, PT DPT 09/26/2019, 8:58 AM  [place] MAIN [facility] SERVICES [address] [place], [place], [address] Phone: [phone]   Fax:  [phone]  Name: Johnny [name]. MRN: [id number] Date of Birth: [birth year]

## 2019-10-01 ENCOUNTER — Other Ambulatory Visit: Payer: Self-pay

## 2019-10-01 ENCOUNTER — Ambulatory Visit: Payer: Medicare Other | Admitting: Physical Therapy

## 2019-10-01 ENCOUNTER — Encounter: Payer: Self-pay | Admitting: Physical Therapy

## 2019-10-01 DIAGNOSIS — R2681 Unsteadiness on feet: Secondary | ICD-10-CM

## 2019-10-01 DIAGNOSIS — R262 Difficulty in walking, not elsewhere classified: Secondary | ICD-10-CM

## 2019-10-01 NOTE — Therapy (Signed)
[place] MAIN [facility] SERVICES [address] [place], [place], [address] Phone: [phone]   Fax:  [phone]  Physical Therapy Treatment  Patient Details  Name: Johnny [name]. MRN: [id number] Date of Birth: [birth year] Referring Provider (PT): Eulogio Ditch, NP    Encounter Date: 10/01/2019  PT End of Session - 10/01/19 0927    Visit Number  29    Number of Visits  41    Date for PT Re-Evaluation  10/22/19    Authorization Type  [employer] Medicare    PT Start Time  0840    PT Stop Time  0935    PT Time Calculation (min)  55 min    Equipment Utilized During Treatment  Gait belt    Activity Tolerance  Patient tolerated treatment well;No increased pain    Behavior During Therapy  WFL for tasks assessed/performed       Past Medical History:  Diagnosis Date  . Anemia   . Anxiety   . CHF (congestive heart failure) ([place])   . Chronic kidney disease    esrd dialysis m/w/f  . Gout   . Hyperlipidemia   . Hypertension   . Myocardial infarction ([place]) 2010   10 years ago  . 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  . Peripheral vascular disease [facility])     Past Surgical History:  Procedure Laterality Date  . A/V FISTULAGRAM Right 09/06/2018   Procedure: A/V FISTULAGRAM;  Surgeon: Algernon Huxley, MD;  Location: [place] CV LAB;  Service: Cardiovascular;  Laterality: Right;  . A/V SHUNTOGRAM Left 06/21/2017   Procedure: A/V SHUNTOGRAM;  Surgeon: Katha Cabal, MD;  Location: [place] CV LAB;  Service: Cardiovascular;  Laterality: Left;  . A/V SHUNTOGRAM N/A 10/24/2018   Procedure: A/V SHUNTOGRAM;  Surgeon: Algernon Huxley, MD;  Location: [place] CV LAB;  Service: Cardiovascular;  Laterality: N/A;  . ABOVE KNEE LEG AMPUTATION Right 2020  . AMPUTATION Right 10/25/2018   Procedure: AMPUTATION ABOVE KNEE;  Surgeon: Algernon Huxley, MD;  Location: [facility] ORS;  Service: General;  Laterality: Right;  .  APPLICATION OF WOUND VAC Right 04/11/2018   Procedure: APPLICATION OF WOUND VAC;  Surgeon: Algernon Huxley, MD;  Location: [facility] ORS;  Service: Vascular;  Laterality: Right;  . AV FISTULA PLACEMENT Left 09/18/2015   Procedure: INSERTION OF ARTERIOVENOUS (AV) GORE-TEX GRAFT ARM ( BRACH/AXILLARY GRAFT W/ INSTANT STICK GRAFT );  Surgeon: Katha Cabal, MD;  Location: [facility] ORS;  Service: Vascular;  Laterality: Left;  . AV FISTULA PLACEMENT Right 07/19/2018   Procedure: INSERTION OF ARTERIOVENOUS (AV) GORE-TEX GRAFT ARM ( BRACHIAL AXILLARY);  Surgeon: Algernon Huxley, MD;  Location: [facility] ORS;  Service: Vascular;  Laterality: Right;  . DIALYSIS FISTULA CREATION Right 10/2017   right chest perm cath  . DIALYSIS/PERMA CATHETER REMOVAL N/A 09/13/2018   Procedure: DIALYSIS/PERMA CATHETER REMOVAL;  Surgeon: Algernon Huxley, MD;  Location: [place] CV LAB;  Service: Cardiovascular;  Laterality: N/A;  . ESOPHAGOGASTRODUODENOSCOPY N/A 12/19/2017   Procedure: ESOPHAGOGASTRODUODENOSCOPY (EGD);  Surgeon: Lin Landsman, MD;  Location: [facility] ENDOSCOPY;  Service: Gastroenterology;  Laterality: N/A;  . LOWER EXTREMITY ANGIOGRAPHY Left 11/16/2017   Procedure: LOWER EXTREMITY ANGIOGRAPHY;  Surgeon: Algernon Huxley, MD;  Location: [place] CV LAB;  Service: Cardiovascular;  Laterality: Left;  . LOWER EXTREMITY ANGIOGRAPHY Right 01/18/2018   Procedure: LOWER EXTREMITY ANGIOGRAPHY;  Surgeon: Algernon Huxley, MD;  Location: [place] INVASIVE CV  LAB;  Service: Cardiovascular;  Laterality: Right;  . LOWER EXTREMITY ANGIOGRAPHY Left 04/02/2018   Procedure: LOWER EXTREMITY ANGIOGRAPHY;  Surgeon: Algernon Huxley, MD;  Location: [place] CV LAB;  Service: Cardiovascular;  Laterality: Left;  . LOWER EXTREMITY ANGIOGRAPHY Right 04/09/2018   Procedure: Lower Extremity Angiography with possible intervention;  Surgeon: Algernon Huxley, MD;  Location: [place] CV LAB;  Service: Cardiovascular;  Laterality: Right;  . LOWER EXTREMITY  ANGIOGRAPHY Right 07/23/2018   Procedure: Lower Extremity Angiography;  Surgeon: Algernon Huxley, MD;  Location: [place] CV LAB;  Service: Cardiovascular;  Laterality: Right;  . LOWER EXTREMITY ANGIOGRAPHY Right 09/13/2018   Procedure: LOWER EXTREMITY ANGIOGRAPHY;  Surgeon: Algernon Huxley, MD;  Location: [place] CV LAB;  Service: Cardiovascular;  Laterality: Right;  . LOWER EXTREMITY VENOGRAPHY Right 09/13/2018   Procedure: LOWER EXTREMITY VENOGRAPHY;  Surgeon: Algernon Huxley, MD;  Location: [place] CV LAB;  Service: Cardiovascular;  Laterality: Right;  . PERIPHERAL VASCULAR CATHETERIZATION Left 09/01/2015   Procedure: A/V Shuntogram/Fistulagram;  Surgeon: Katha Cabal, MD;  Location: [place] CV LAB;  Service: Cardiovascular;  Laterality: Left;  . PERIPHERAL VASCULAR CATHETERIZATION N/A 09/30/2015   Procedure: A/V Shuntogram/Fistulagram with perm cathether removal;  Surgeon: Algernon Huxley, MD;  Location: [place] CV LAB;  Service: Cardiovascular;  Laterality: N/A;  . PERIPHERAL VASCULAR CATHETERIZATION Left 09/30/2015   Procedure: A/V Shunt Intervention;  Surgeon: Algernon Huxley, MD;  Location: [place] CV LAB;  Service: Cardiovascular;  Laterality: Left;  . PERIPHERAL VASCULAR CATHETERIZATION Left 12/03/2015   Procedure: Thrombectomy;  Surgeon: Algernon Huxley, MD;  Location: [place] CV LAB;  Service: Cardiovascular;  Laterality: Left;  . PERIPHERAL VASCULAR CATHETERIZATION Left 01/28/2016   Procedure: Thrombectomy;  Surgeon: Algernon Huxley, MD;  Location: [place] CV LAB;  Service: Cardiovascular;  Laterality: Left;  . PERIPHERAL VASCULAR CATHETERIZATION N/A 01/28/2016   Procedure: A/V Shuntogram/Fistulagram;  Surgeon: Algernon Huxley, MD;  Location: [place] CV LAB;  Service: Cardiovascular;  Laterality: N/A;  . SKIN SPLIT GRAFT Right 05/24/2018   Procedure: SKIN GRAFT SPLIT THICKNESS ( RIGHT CALF);  Surgeon: Algernon Huxley, MD;  Location: [facility] ORS;  Service: Vascular;   Laterality: Right;  . UPPER EXTREMITY ANGIOGRAPHY  10/24/2018   Procedure: Upper Extremity Angiography;  Surgeon: Algernon Huxley, MD;  Location: [place] CV LAB;  Service: Cardiovascular;;  . UPPER EXTREMITY ANGIOGRAPHY Right 02/14/2019   Procedure: UPPER EXTREMITY ANGIOGRAPHY;  Surgeon: Algernon Huxley, MD;  Location: [place] CV LAB;  Service: Cardiovascular;  Laterality: Right;  . WOUND DEBRIDEMENT Right 04/11/2018   Procedure: DEBRIDEMENT WOUND calf muscle and skin;  Surgeon: Algernon Huxley, MD;  Location: [facility] ORS;  Service: Vascular;  Laterality: Right;    There were no vitals filed for this visit.  Subjective Assessment - 10/01/19 0926    Subjective  Patient reports that he is doing well today, has been doing his exercises at home, marching and sleeping on his stomach. . He reports no stumbles or falls since last therapy session and states that he has been standing  at home. He denies any pain at rest upon arrival today. He has appointment with prosthesis on thursday.    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 Pt DC hospital to SNF for rehab. He has since been back at home. Pt uses a manual WC or power chair for daily mobility. Pt takes tranportation  services to HD MWF. Pt was recently fitted for his AKA prosthesis in Jan 2021.    Limitations  Standing;Walking;House hold activities    How long can you sit comfortably?  No difficulty    How long can you stand comfortably?  1 minute    How long can you walk comfortably?  unable    Currently in Pain?  No/denies    Multiple Pain Sites  No       Treatment: Nu-step x 5 mins with assist for RLE prosthesis L2  Standing training with weight shifting left and right and fwd/bwd x 2 mins each direction Gait training with RLE prosthesis and parallel bars x 80 feet, 40 feet , 40 feet with CGA and cues for upright posture. Patient has flexed trunk and hips in standing  Patient  performed with instruction, verbal cues, tactile cues of therapist: goal:increase tissue extensibility, promote proper posture, improve mobility                         PT Education - 10/01/19 0927    Education Details  safety with transfers and gait    Person(s) Educated  Patient    Methods  Explanation    Comprehension  Verbalized understanding       PT Short Term Goals - 08/29/19 0826      PT SHORT TERM GOAL #1   Title  Pt to demonstrate ability to perfrom STS transfer from chair c RW minGuard assist and lock his RLE knee joint without assist.    Baseline  ModA from elevated surface at eval; 08/29/19: minA+1 with BUE support from regular height chair with arm rests   Now able to perform with supervision from chair +2 airexpads.   Time  4    Period  Weeks    Status  Partially Met    Target Date  06/25/19      PT SHORT TERM GOAL #2   Title  Pt to demonstrate 5 degrees hip flexion 10 degrees hip abdct ROM.    Baseline  lacks 5 degrees from neutral in both at eval; At visit 10, has 0 degrees flexion/extension; still lacking 5 degrees ABDCT neutral; 08/29/19: R hip: lacking 5 degrees of extension, 15 degrees abduction    Time  4    Period  Weeks    Status  Achieved    Target Date  06/25/19        PT Long Term Goals - 08/29/19 0843      PT LONG TERM GOAL #1   Title  Pt to demonstrates 15 degrees Rt hip ABDCT, 10 degrees Rt hip extension P/ROM to facilitate prosthesis motor control.    Baseline  08/29/19: R hip: lacking 5 degrees of extension, 15 degrees abduction    Time  8    Period  Weeks    Status  Partially Met    Target Date  10/22/19      PT LONG TERM GOAL #2   Title  Pt to demonstrate 5/5 strength hip extension at 0 degrees flexion/extension    Baseline  has 5/5 in some ranges, but not in ranges specific to standing or gait.; 08/29/19: Unable to get to neutral flexion extension but 5/5 R hip extension in other ranges    Time  8    Period  Weeks     Status  On-going    Target Date  10/22/19      PT LONG TERM GOAL #  3   Title  Pt able to perfrom 5x STS from chair with RW, arms ad lib in <21sec    Baseline  At visit 10 requires chair +2 pads +RW; 08/29/19: minA+1 with BUE support from regular height chair with arm rests to stand, unable to perform 5TSTS    Time  8    Period  Weeks    Status  Partially Met    Target Date  10/22/19      PT LONG TERM GOAL #4   Title  Pt will be able to ambulate at least 100' with RW and supervision with proper locking of R prosthesis in order to demonstrate safe household ambulation in order to improve independence with gait without need for power wheelchair    Baseline  08/29/19: Pt has ambulated 66' with CGA and wheelchair follow with RW but episodes of buckling;    Time  12    Period  Weeks    Status  New    Target Date  10/22/19            Plan - 10/01/19 0928    Clinical Impression Statement  Patient performs gait training with R LE prosthesis in parallel bars with cues for upright posture and sequencing for gait. Patient has fatigue with standing and gait and is getting better with locking the R knee before weight shifting over on the RLE. Patient will continue to benefit from skilled PT to improve mobility and strength.    Personal Factors and Comorbidities  Age;Fitness;Past/Current Experience;Education;Transportation    Examination-Activity Limitations  Transfers;Dressing;Squat;Stairs;Stand;Bed Mobility    Examination-Participation Restrictions  Meal Prep;Cleaning;Community Activity;Driving;Yard Work;Laundry;Shop    Stability/Clinical Decision Making  Stable/Uncomplicated    Rehab Potential  Good    PT Frequency  2x / week    PT Duration  12 weeks    PT Treatment/Interventions  ADLs/Self Care Home Management;Cryotherapy;Electrical Stimulation;DME Instruction;Gait training;Stair training;Functional mobility training;Therapeutic activities;Therapeutic exercise;Balance training;Neuromuscular  re-education;Cognitive remediation;Patient/family education;Prosthetic Training;Wheelchair mobility training;Passive range of motion;Dry needling;Scar mobilization;Spinal Manipulations;Joint Manipulations    PT Next Visit Plan  Strength, balance, transfers, and gait training with CGA and wheelchair follow    PT Home Exercise Plan  Medbridge Access Code: [email or web address] (prone positioning), L single leg bridges, L sidelying R hip abduction, hip isometric adduction    Consulted and Agree with Plan of Care  Patient       Patient will benefit from skilled therapeutic intervention in order to improve the following deficits and impairments:  Decreased activity tolerance, Decreased balance, Decreased mobility, Decreased strength, Decreased cognition, Decreased knowledge of use of DME, Decreased knowledge of precautions, Decreased endurance, Decreased range of motion, Difficulty walking, Decreased safety awareness, Decreased skin integrity, Decreased scar mobility, Hypomobility, Postural dysfunction, Increased edema, Increased muscle spasms, Increased fascial restricitons, Prosthetic Dependency, Impaired tone, Improper body mechanics  Visit Diagnosis: Difficulty in walking, not elsewhere classified  Unsteadiness on feet     Problem List Patient Active Problem List   Diagnosis Date Noted  . Cardiomyopathy ([place]) 01/22/2019  . CHF (congestive heart failure) ([place]) 01/22/2019  . Coronary disease 01/22/2019  . Above knee amputation of right lower extremity ([place]) 11/13/2018  . Leg ulcer, right, with fat layer exposed ([place]) 10/22/2018  . Cellulitis of right leg 07/21/2018  . Lower limb ulcer, calf, right, limited to breakdown of skin ([place]) 06/08/2018  . PVD (peripheral vascular disease) ([place]) 04/17/2018  . Malnutrition of moderate degree 04/11/2018  . Pressure injury of skin 04/06/2018  . Altered mental status 04/04/2018  .  Hypothermia 04/04/2018  . Hemodialysis graft malfunction ([place]) 03/26/2018  .  Hypercholesterolemia 02/27/2018  . Diabetes ([place]) 02/27/2018  . Weakness of right lower extremity 01/20/2018  . Fever   . Periumbilical abdominal pain   . Confusion 12/22/2017  . Acute delirium 12/21/2017  . Protein-calorie malnutrition, severe 12/19/2017  . Intractable nausea and vomiting 12/18/2017  . Lymphedema 12/13/2017  . Cellulitis 11/27/2017  . Chest pain 11/19/2017  . Atherosclerosis of native arteries of the extremities with ulceration ([place]) 11/07/2017  . Twitching 01/03/2017  . Elevated troponin 10/02/2015  . Complications, dialysis, catheter, mechanical ([place]) 10/02/2015  . Musculoskeletal chest pain 09/28/2015  . Chronic diastolic CHF (congestive heart failure) ([place]) 09/28/2015  . End stage renal disease ([place]) 10/09/2012  . Hypertension 10/09/2012  . Gout 10/09/2012    Alanson Puls, PT DPT 10/01/2019, 9:30 AM  [place] MAIN [facility] SERVICES [address] [place], [place], [address] Phone: [phone]   Fax:  [phone]  Name: Johnny [name]. MRN: [id number] Date of Birth: April 24, 1951

## 2019-10-08 ENCOUNTER — Telehealth (INDEPENDENT_AMBULATORY_CARE_PROVIDER_SITE_OTHER): Payer: Self-pay

## 2019-10-08 ENCOUNTER — Ambulatory Visit: Payer: Medicare Other | Admitting: Physical Therapy

## 2019-10-08 NOTE — Telephone Encounter (Signed)
Following the patient's procedure, the swelling was decreased and it was decreased at his most recent visit.  We can bring him in for an HDA

## 2019-10-10 ENCOUNTER — Ambulatory Visit: Payer: Medicare Other | Admitting: Physical Therapy

## 2019-10-14 ENCOUNTER — Other Ambulatory Visit (INDEPENDENT_AMBULATORY_CARE_PROVIDER_SITE_OTHER): Payer: Self-pay | Admitting: Nurse Practitioner

## 2019-10-14 DIAGNOSIS — M7989 Other specified soft tissue disorders: Secondary | ICD-10-CM

## 2019-10-15 ENCOUNTER — Ambulatory Visit: Payer: Medicare Other | Attending: Nurse Practitioner | Admitting: Physical Therapy

## 2019-10-15 ENCOUNTER — Ambulatory Visit (INDEPENDENT_AMBULATORY_CARE_PROVIDER_SITE_OTHER): Payer: Medicare Other

## 2019-10-15 ENCOUNTER — Encounter (INDEPENDENT_AMBULATORY_CARE_PROVIDER_SITE_OTHER): Payer: Self-pay | Admitting: Nurse Practitioner

## 2019-10-15 ENCOUNTER — Ambulatory Visit (INDEPENDENT_AMBULATORY_CARE_PROVIDER_SITE_OTHER): Payer: Medicare Other | Admitting: Nurse Practitioner

## 2019-10-15 ENCOUNTER — Other Ambulatory Visit: Payer: Self-pay

## 2019-10-15 VITALS — BP 196/97 | HR 80 | Resp 16 | Ht 68.0 in | Wt 171.0 lb

## 2019-10-15 DIAGNOSIS — N186 End stage renal disease: Secondary | ICD-10-CM | POA: Diagnosis not present

## 2019-10-15 DIAGNOSIS — M7989 Other specified soft tissue disorders: Secondary | ICD-10-CM

## 2019-10-15 DIAGNOSIS — I739 Peripheral vascular disease, unspecified: Secondary | ICD-10-CM

## 2019-10-15 DIAGNOSIS — R2681 Unsteadiness on feet: Secondary | ICD-10-CM | POA: Insufficient documentation

## 2019-10-15 DIAGNOSIS — R262 Difficulty in walking, not elsewhere classified: Secondary | ICD-10-CM

## 2019-10-15 DIAGNOSIS — I10 Essential (primary) hypertension: Secondary | ICD-10-CM

## 2019-10-15 NOTE — Therapy (Addendum)
[place] MAIN [facility] SERVICES [place], [place], [address] Phone: [phone]   Fax:  [phone]  Physical Therapy Treatment/ Physical Therapy Progress Note   Dates of reporting period 08/29/19   to 10/15/19  Patient Details  Name: Johnny [name]. MRN: [id number] Date of Birth: [birth year] Referring Provider (PT): Eulogio Ditch, NP    Encounter Date: 10/15/2019  PT End of Session - 10/15/19 0933    Visit Number  30    Number of Visits  41    Date for PT Re-Evaluation  10/22/19    Authorization Type  [employer] Medicare    PT Start Time  0900    PT Stop Time  0925    PT Time Calculation (min)  25 min    Equipment Utilized During Treatment  Gait belt    Activity Tolerance  Patient tolerated treatment well;No increased pain    Behavior During Therapy  WFL for tasks assessed/performed       Past Medical History:  Diagnosis Date  . Anemia   . Anxiety   . CHF (congestive heart failure) ([place])   . Chronic kidney disease    esrd dialysis m/w/f  . Gout   . Hyperlipidemia   . Hypertension   . Myocardial infarction ([place]) 2010   10 years ago  . 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  . Peripheral vascular disease [facility])     Past Surgical History:  Procedure Laterality Date  . A/V FISTULAGRAM Right 09/06/2018   Procedure: A/V FISTULAGRAM;  Surgeon: Algernon Huxley, MD;  Location: [place] [facility];  Service: Cardiovascular;  Laterality: Right;  . A/V SHUNTOGRAM Left 06/21/2017   Procedure: A/V SHUNTOGRAM;  Surgeon: Katha Cabal, MD;  Location: [place] [facility];  Service: Cardiovascular;  Laterality: Left;  . A/V SHUNTOGRAM N/A 10/24/2018   Procedure: A/V SHUNTOGRAM;  Surgeon: Algernon Huxley, MD;  Location: [place] [facility];  Service: Cardiovascular;  Laterality: N/A;  . ABOVE KNEE LEG AMPUTATION Right 2020  . AMPUTATION Right 10/25/2018   Procedure: AMPUTATION ABOVE KNEE;  Surgeon: Algernon Huxley, MD;  Location: [facility] ORS;  Service: General;  Laterality: Right;  . APPLICATION OF WOUND VAC Right 04/11/2018   Procedure: APPLICATION OF WOUND VAC;  Surgeon: Algernon Huxley, MD;  Location: [facility] ORS;  Service: Vascular;  Laterality: Right;  . AV FISTULA PLACEMENT Left 09/18/2015   Procedure: INSERTION OF ARTERIOVENOUS (AV) GORE-TEX GRAFT ARM ( BRACH/AXILLARY GRAFT W/ INSTANT STICK GRAFT );  Surgeon: Katha Cabal, MD;  Location: [facility] ORS;  Service: Vascular;  Laterality: Left;  . AV FISTULA PLACEMENT Right 07/19/2018   Procedure: INSERTION OF ARTERIOVENOUS (AV) GORE-TEX GRAFT ARM ( BRACHIAL AXILLARY);  Surgeon: Algernon Huxley, MD;  Location: [facility] ORS;  Service: Vascular;  Laterality: Right;  . DIALYSIS FISTULA CREATION Right 10/2017   right chest perm cath  . DIALYSIS/PERMA CATHETER REMOVAL N/A 09/13/2018   Procedure: DIALYSIS/PERMA CATHETER REMOVAL;  Surgeon: Algernon Huxley, MD;  Location: [place] [facility];  Service: Cardiovascular;  Laterality: N/A;  . ESOPHAGOGASTRODUODENOSCOPY N/A 12/19/2017   Procedure: ESOPHAGOGASTRODUODENOSCOPY (EGD);  Surgeon: Lin [place]man, MD;  Location: [facility];  Service: Gastroenterology;  Laterality: N/A;  . LOWER EXTREMITY ANGIOGRAPHY Left 11/16/2017   Procedure: LOWER EXTREMITY ANGIOGRAPHY;  Surgeon: Algernon Huxley, MD;  Location: [place] [facility];  Service: Cardiovascular;  Laterality: Left;  . LOWER EXTREMITY ANGIOGRAPHY Right 01/18/2018  Procedure: LOWER EXTREMITY ANGIOGRAPHY;  Surgeon: Algernon Huxley, MD;  Location: [place] [facility];  Service: Cardiovascular;  Laterality: Right;  . LOWER EXTREMITY ANGIOGRAPHY Left 04/02/2018   Procedure: LOWER EXTREMITY ANGIOGRAPHY;  Surgeon: Algernon Huxley, MD;  Location: [place] [facility];  Service: Cardiovascular;  Laterality: Left;  . LOWER EXTREMITY ANGIOGRAPHY Right 04/09/2018   Procedure: Lower Extremity Angiography with possible intervention;  Surgeon: Algernon Huxley, MD;  Location: [place] [facility];   Service: Cardiovascular;  Laterality: Right;  . LOWER EXTREMITY ANGIOGRAPHY Right 07/23/2018   Procedure: Lower Extremity Angiography;  Surgeon: Algernon Huxley, MD;  Location: [place] [facility];  Service: Cardiovascular;  Laterality: Right;  . LOWER EXTREMITY ANGIOGRAPHY Right 09/13/2018   Procedure: LOWER EXTREMITY ANGIOGRAPHY;  Surgeon: Algernon Huxley, MD;  Location: [place] [facility];  Service: Cardiovascular;  Laterality: Right;  . LOWER EXTREMITY VENOGRAPHY Right 09/13/2018   Procedure: LOWER EXTREMITY VENOGRAPHY;  Surgeon: Algernon Huxley, MD;  Location: [place] [facility];  Service: Cardiovascular;  Laterality: Right;  . PERIPHERAL VASCULAR CATHETERIZATION Left 09/01/2015   Procedure: A/V Shuntogram/Fistulagram;  Surgeon: Katha Cabal, MD;  Location: [place] [facility];  Service: Cardiovascular;  Laterality: Left;  . PERIPHERAL VASCULAR CATHETERIZATION N/A 09/30/2015   Procedure: A/V Shuntogram/Fistulagram with perm cathether removal;  Surgeon: Algernon Huxley, MD;  Location: [place] [facility];  Service: Cardiovascular;  Laterality: N/A;  . PERIPHERAL VASCULAR CATHETERIZATION Left 09/30/2015   Procedure: A/V Shunt Intervention;  Surgeon: Algernon Huxley, MD;  Location: [place] [facility];  Service: Cardiovascular;  Laterality: Left;  . PERIPHERAL VASCULAR CATHETERIZATION Left 12/03/2015   Procedure: Thrombectomy;  Surgeon: Algernon Huxley, MD;  Location: [place] [facility];  Service: Cardiovascular;  Laterality: Left;  . PERIPHERAL VASCULAR CATHETERIZATION Left 01/28/2016   Procedure: Thrombectomy;  Surgeon: Algernon Huxley, MD;  Location: [place] [facility];  Service: Cardiovascular;  Laterality: Left;  . PERIPHERAL VASCULAR CATHETERIZATION N/A 01/28/2016   Procedure: A/V Shuntogram/Fistulagram;  Surgeon: Algernon Huxley, MD;  Location: [place] [facility];  Service: Cardiovascular;  Laterality: N/A;  . SKIN SPLIT GRAFT Right 05/24/2018   Procedure: SKIN GRAFT SPLIT THICKNESS ( RIGHT CALF);  Surgeon:  Algernon Huxley, MD;  Location: [facility] OR[facility];  Service: Vascular;  Laterality: Right;  . UPPER EXTREMITY ANGIOGRAPHY  10/24/2018   Procedure: Upper Extremity Angiography;  Surgeon: Algernon Huxley, MD;  Location: [place] [facility];  Service: Cardiovascular;;  . UPPER EXTREMITY ANGIOGRAPHY Right 02/14/2019   Procedure: UPPER EXTREMITY ANGIOGRAPHY;  Surgeon: Algernon Huxley, MD;  Location: [place] [facility];  Service: Cardiovascular;  Laterality: Right;  . WOUND DEBRIDEMENT Right 04/11/2018   Procedure: DEBRIDEMENT WOUND calf muscle and skin;  Surgeon: Algernon Huxley, MD;  Location: [facility] OR[facility];  Service: Vascular;  Laterality: Right;    There were no vitals filed for this visit.  Subjective Assessment - 10/15/19 0932    Subjective  Patient had his prosthesis adjusted so that it fits better.    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 Pt DC hospital to SNF for rehab. He has since been back at home. Pt uses a manual WC or power chair for daily mobility. Pt takes tranportation services to HD MWF. Pt was recently fitted for his AKA prosthesis in Jan 2021.    Limitations  Standing;Walking;House hold activities    How long can you sit comfortably?  No difficulty    How long can you stand comfortably?  1 minute    How long can you walk comfortably?  unable    Currently in Pain?  No/denies    Multiple Pain Sites  No       Treatment: Goals and outcome measures were reviewed and discussed.                         PT Education - 10/15/19 0933    Education Details  HEP    Person(s) Educated  Patient    Methods  Explanation    Comprehension  Verbalized understanding;Verbal cues required;Tactile cues required;Need further instruction       PT Short Term Goals - 08/29/19 7035      PT SHORT TERM GOAL #1   Title  Pt to demonstrate ability to perfrom STS transfer from chair c RW minGuard assist and lock his RLE knee joint  without assist.    Baseline  ModA from elevated surface at eval; 08/29/19: minA+1 with BUE support from regular height chair with arm rests   Now able to perform with supervision from chair +2 airexpads.   Time  4    Period  Weeks    Status  Partially Met    Target Date  06/25/19      PT SHORT TERM GOAL #2   Title  Pt to demonstrate 5 degrees hip flexion 10 degrees hip abdct ROM.    Baseline  lacks 5 degrees from neutral in both at eval; At visit 10, has 0 degrees flexion/extension; still lacking 5 degrees ABDCT neutral; 08/29/19: R hip: lacking 5 degrees of extension, 15 degrees abduction    Time  4    Period  Weeks    Status  Achieved    Target Date  06/25/19        PT Long Term Goals - 10/15/19 0934      PT LONG TERM GOAL #1   Title  Pt to demonstrates 15 degrees Rt hip ABDCT, 10 degrees Rt hip extension P/ROM to facilitate prosthesis motor control.    Baseline  08/29/19: R hip: lacking 5 degrees of extension, 15 degrees abduction, 10/15/19= R hip lacking 10 deg extension    Time  8    Period  Weeks    Status  Partially Met    Target Date  12/17/19      PT LONG TERM GOAL #2   Title  Pt to demonstrate 5/5 strength hip extension at 0 degrees flexion/extension    Baseline  has 5/5 in some ranges, but not in ranges specific to standing or gait.; 08/29/19: Unable to get to neutral flexion extension but 5/5 R hip extension in other ranges, 10/15/19= -3/5 right hip extension    Time  8    Period  Weeks    Status  On-going    Target Date  12/17/19      PT LONG TERM GOAL #3   Title  Pt able to perfrom 5x STS from chair with RW, arms ad lib in <21sec    Baseline  At visit 10 requires chair +2 pads +RW; 08/29/19: minA+1 with BUE support from regular height chair with arm rests to stand, unable to perform 5TSTS, 10/15/19=continues to need assist for sit to stand    Time  8    Period  Weeks    Status  Partially Met    Target Date  12/17/19  PT LONG TERM GOAL #4   Title  Pt will be able  to ambulate at least 100' with RW and supervision with proper locking of R prosthesis in order to demonstrate safe household ambulation in order to improve independence with gait without need for power wheelchair    Baseline  08/29/19: Pt has ambulated 52' with CGA and wheelchair follow with RW but episodes of buckling;10/15/19= pt is ambulating in parallel bars due to prosthesis not fiting properly and needs the safety    Time  12    Period  Weeks    Status  Partially Met    Target Date  12/17/19            Plan - 10/15/19 0933    Clinical Impression Statement  Patient's condition has the potential to improve in response to therapy. Maximum improvement is yet to be obtained. The anticipated improvement is attainable and reasonable in a generally predictable time.  Patient reports that he just had his prosthesis fitted better and now is able to stand better. He is doing his HEP. Patient is making progress towards goals 1,2,3,4. Patient will continue to benefit from skilled PT to improve functional mobility.    Personal Factors and Comorbidities  Age;Fitness;Past/Current Experience;Education;Transportation    Examination-Activity Limitations  Transfers;Dressing;Squat;Stairs;Stand;Bed Mobility    Examination-Participation Restrictions  Meal Prep;Cleaning;Community Activity;Driving;Yard Work;Laundry;Shop    Stability/Clinical Decision Making  Stable/Uncomplicated    Rehab Potential  Good    PT Frequency  2x / week    PT Duration  12 weeks    PT Treatment/Interventions  ADLs/Self Care Home Management;Cryotherapy;Electrical Stimulation;DME Instruction;Gait training;Stair training;Functional mobility training;Therapeutic activities;Therapeutic exercise;Balance training;Neuromuscular re-education;Cognitive remediation;Patient/family education;Prosthetic Training;Wheelchair mobility training;Passive range of motion;Dry needling;Scar mobilization;Spinal Manipulations;Joint Manipulations    PT Next Visit  Plan  Strength, balance, transfers, and gait training with CGA and wheelchair follow    PT Home Exercise Plan  Medbridge Access Code: [email or web address] (prone positioning), L single leg bridges, L sidelying R hip abduction, hip isometric adduction    Consulted and Agree with Plan of Care  Patient       Patient will benefit from skilled therapeutic intervention in order to improve the following deficits and impairments:  Decreased activity tolerance, Decreased balance, Decreased mobility, Decreased strength, Decreased cognition, Decreased knowledge of use of DME, Decreased knowledge of precautions, Decreased endurance, Decreased range of motion, Difficulty walking, Decreased safety awareness, Decreased skin integrity, Decreased scar mobility, Hypomobility, Postural dysfunction, Increased edema, Increased muscle spasms, Increased fascial restricitons, Prosthetic Dependency, Impaired tone, Improper body mechanics  Visit Diagnosis: Difficulty in walking, not elsewhere classified  Unsteadiness on feet     Problem List Patient Active Problem List   Diagnosis Date Noted  . Cardiomyopathy ([place]) 01/22/2019  . CHF (congestive heart failure) ([place]) 01/22/2019  . Coronary disease 01/22/2019  . Above knee amputation of right lower extremity ([place]) 11/13/2018  . Leg ulcer, right, with fat layer exposed ([place]) 10/22/2018  . Cellulitis of right leg 07/21/2018  . Lower limb ulcer, calf, right, limited to breakdown of skin ([place]) 06/08/2018  . PVD (peripheral vascular disease) ([place]) 04/17/2018  . Malnutrition of moderate degree 04/11/2018  . Pressure injury of skin 04/06/2018  . Altered mental status 04/04/2018  . Hypothermia 04/04/2018  . Hemodialysis graft malfunction ([place]) 03/26/2018  . Hypercholesterolemia 02/27/2018  . Diabetes ([place]) 02/27/2018  . Weakness of right lower extremity 01/20/2018  . Fever   . Periumbilical abdominal pain   . Confusion 12/22/2017  . Acute delirium 12/21/2017  .  Protein-calorie malnutrition, severe 12/19/2017  . Intractable nausea and vomiting 12/18/2017  . Lymphedema 12/13/2017  . Cellulitis 11/27/2017  . Chest pain 11/19/2017  . 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 Twitching 01/03/2017  . Elevated troponin 10/02/2015  . Complications, dialysis, catheter, mechanical ([place]) 10/02/2015  . Musculoskeletal chest pain 09/28/2015  . Chronic diastolic CHF (congestive heart failure) ([place]) 09/28/2015  . End stage renal disease ([place]) 10/09/2012  . Hypertension 10/09/2012  . Gout 10/09/2012    Alanson Puls, PT DPT 10/15/2019, 9:42 AM  [place] MAIN [facility] SERVICES [address] [place], [place], [address] Phone: [phone]   Fax:  [phone]  Name: Johnny [name]. MRN: [id number] Date of Birth: [birth year]

## 2019-10-16 ENCOUNTER — Encounter (INDEPENDENT_AMBULATORY_CARE_PROVIDER_SITE_OTHER): Payer: Self-pay | Admitting: Nurse Practitioner

## 2019-10-16 NOTE — Progress Notes (Signed)
Subjective:    Patient ID: Johnny Bucco., male    DOB: 10-06-1950, 69 y.o.   MRN: [id number] Chief Complaint  Patient presents with  . Follow-up    ultrasound    The patient presents today after his caretaker contacted our office with concern for swollen right upper extremity.  She notes that the patient's right upper extremity has been swollen since intervention months ago to his central venous system.  Following the intervention the patient's arm swelling greatly decreased during his last office visit the patient did not exhibit any swelling.  Today, the patient has no swelling of his upper extremity present however he does note that he will swell after dialysis treatments from time to time or if he is holding additional fluid he can swell as well.  However the patient notes that even if his right upper extremity swells it generally returns to size within a day or so.  He also reports having no issues with claudications doing well with his prosthetic of his right lower extremity.  He denies any fever, chills, nausea, vomiting or diarrhea.  He denies any chest pain or shortness of breath  Today noninvasive studies show a flow volume of 1788.  His brachioaxillary graft is patent with no evidence of significant stenosis.   Review of Systems  Cardiovascular: Positive for leg swelling.  Neurological: Positive for weakness.  All other systems reviewed and are negative.      Objective:   Physical Exam Vitals reviewed.  HENT:     Head: Normocephalic.  Cardiovascular:     Rate and Rhythm: Normal rate and regular rhythm.     Pulses: Normal pulses.          Radial pulses are 2+ on the right side and 2+ on the left side.     Heart sounds: Normal heart sounds.     Arteriovenous access: right arteriovenous access is present.    Comments: Good thrill and bruit in brachial axillary graft.  No swollen right upper extremity Pulmonary:     Effort: Pulmonary effort is normal.     Breath  sounds: Normal breath sounds.  Musculoskeletal:     Right Lower Extremity: Right leg is amputated above knee.  Neurological:     Mental Status: He is alert and oriented to person, place, and time.  Psychiatric:        Mood and Affect: Mood normal.        Behavior: Behavior normal.        Thought Content: Thought content normal.        Judgment: Judgment normal.     BP (!) 196/97 (BP Location: Left Arm)   Pulse 80   Resp 16   Ht 5\' 8"  (1.727 m)   Wt 171 lb (77.6 kg)   BMI 26.00 kg/m   Past Medical History:  Diagnosis Date  . Anemia   . Anxiety   . CHF (congestive heart failure) ([place])   . Chronic kidney disease    esrd dialysis m/w/f  . Gout   . Hyperlipidemia   . Hypertension   . Myocardial infarction ([place]) 2010   10 years ago  . 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  . Peripheral vascular disease ([place])     Social History   Socioeconomic History  . Marital status: Single    Spouse name: Not on file  . Number of children: 0  . Years of education: Not on file  .  Highest education level: Not on file  Occupational History  . Occupation: retired    Comment: Medical laboratory scientific officer  Tobacco Use  . Smoking status: Former Smoker    Types: Cigarettes    Quit date: 05/17/2005    Years since quitting: 14.4  . Smokeless tobacco: Never Used  Substance and Sexual Activity  . Alcohol use: No  . Drug use: No  . Sexual activity: Not Currently  Other Topics Concern  . Not on file  Social History Narrative  . Not on file   Social Determinants of Health   Financial Resource Strain:   . Difficulty of Paying Living Expenses:   Food Insecurity:   . Worried About Charity fundraiser in the Last Year:   . Arboriculturist in the Last Year:   Transportation Needs:   . Film/video editor (Medical):   [name] Lack of Transportation (Non-Medical):   Physical Activity:   . Days of Exercise per Week:   . Minutes of Exercise per Session:     Stress:   . Feeling of Stress :   Social Connections:   . Frequency of Communication with Friends and Family:   . Frequency of Social Gatherings with Friends and Family:   . Attends Religious Services:   . Active Member of Clubs or Organizations:   . Attends Archivist Meetings:   [name] Marital Status:   Intimate Partner Violence:   . Fear of Current or Ex-Partner:   . Emotionally Abused:   [name] Physically Abused:   . Sexually Abused:     Past Surgical History:  Procedure Laterality Date  . A/V FISTULAGRAM Right 09/06/2018   Procedure: A/V FISTULAGRAM;  Surgeon: Algernon Huxley, MD;  Location: [facility];  Service: Cardiovascular;  Laterality: Right;  . A/V SHUNTOGRAM Left 06/21/2017   Procedure: A/V SHUNTOGRAM;  Surgeon: Katha Cabal, MD;  Location: [facility];  Service: Cardiovascular;  Laterality: Left;  . A/V SHUNTOGRAM N/A 10/24/2018   Procedure: A/V SHUNTOGRAM;  Surgeon: Algernon Huxley, MD;  Location: [facility];  Service: Cardiovascular;  Laterality: N/A;  . ABOVE KNEE LEG AMPUTATION Right 2020  . AMPUTATION Right 10/25/2018   Procedure: AMPUTATION ABOVE KNEE;  Surgeon: Algernon Huxley, MD;  Location: [facility];  Service: General;  Laterality: Right;  . APPLICATION OF WOUND VAC Right 04/11/2018   Procedure: APPLICATION OF WOUND VAC;  Surgeon: Algernon Huxley, MD;  Location: [facility];  Service: Vascular;  Laterality: Right;  . AV FISTULA PLACEMENT Left 09/18/2015   Procedure: INSERTION OF ARTERIOVENOUS (AV) GORE-TEX GRAFT ARM ( BRACH/AXILLARY GRAFT W/ INSTANT STICK GRAFT );  Surgeon: Katha Cabal, MD;  Location: [facility];  Service: Vascular;  Laterality: Left;  . AV FISTULA PLACEMENT Right 07/19/2018   Procedure: INSERTION OF ARTERIOVENOUS (AV) GORE-TEX GRAFT ARM ( BRACHIAL AXILLARY);  Surgeon: Algernon Huxley, MD;  Location: [facility];  Service: Vascular;  Laterality: Right;  . DIALYSIS FISTULA CREATION Right 10/2017   right chest perm cath  .  DIALYSIS/PERMA CATHETER REMOVAL N/A 09/13/2018   Procedure: DIALYSIS/PERMA CATHETER REMOVAL;  Surgeon: Algernon Huxley, MD;  Location: [place] [facility];  Service: Cardiovascular;  Laterality: N/A;  . ESOPHAGOGASTRODUODENOSCOPY N/A 12/19/2017   Procedure: ESOPHAGOGASTRODUODENOSCOPY (EGD);  Surgeon: Lin Landsman, MD;  Location[facility];  Service: Gastroenterology;  Laterality: N/A;  . LOWER EXTREMITY ANGIOGRAPHY Left 11/16/2017   Procedure: LOWER EXTREMITY ANGIOGRAPHY;  Surgeon: Leotis Pain  S, MD;  Location: [place] [facility];  Service: Cardiovascular;  Laterality: Left;  . LOWER EXTREMITY ANGIOGRAPHY Right 01/18/2018   Procedure: LOWER EXTREMITY ANGIOGRAPHY;  Surgeon: Algernon Huxley, MD;  Location: [place] [facility];  Service: Cardiovascular;  Laterality: Right;  . LOWER EXTREMITY ANGIOGRAPHY Left 04/02/2018   Procedure: LOWER EXTREMITY ANGIOGRAPHY;  Surgeon: Algernon Huxley, MD;  Location: [place] [facility];  Service: Cardiovascular;  Laterality: Left;  . LOWER EXTREMITY ANGIOGRAPHY Right 04/09/2018   Procedure: Lower Extremity Angiography with possible intervention;  Surgeon: Algernon Huxley, MD;  Location: [place] [facility];  Service: Cardiovascular;  Laterality: Right;  . LOWER EXTREMITY ANGIOGRAPHY Right 07/23/2018   Procedure: Lower Extremity Angiography;  Surgeon: Algernon Huxley, MD;  Location: [place] [facility];  Service: Cardiovascular;  Laterality: Right;  . LOWER EXTREMITY ANGIOGRAPHY Right 09/13/2018   Procedure: LOWER EXTREMITY ANGIOGRAPHY;  Surgeon: Algernon Huxley, MD;  Location: [place] [facility];  Service: Cardiovascular;  Laterality: Right;  . LOWER EXTREMITY VENOGRAPHY Right 09/13/2018   Procedure: LOWER EXTREMITY VENOGRAPHY;  Surgeon: Algernon Huxley, MD;  Location: [place] [facility];  Service: Cardiovascular;  Laterality: Right;  . PERIPHERAL VASCULAR CATHETERIZATION Left 09/01/2015   Procedure: A/V Shuntogram/Fistulagram;  Surgeon: Katha Cabal, MD;  Location: [place] [facility];  Service: Cardiovascular;  Laterality: Left;  . PERIPHERAL VASCULAR CATHETERIZATION N/A 09/30/2015   Procedure: A/V Shuntogram/Fistulagram with perm cathether removal;  Surgeon: Algernon Huxley, MD;  Location: [place] [facility];  Service: Cardiovascular;  Laterality: N/A;  . PERIPHERAL VASCULAR CATHETERIZATION Left 09/30/2015   Procedure: A/V Shunt Intervention;  Surgeon: Algernon Huxley, MD;  Location: [place] [facility];  Service: Cardiovascular;  Laterality: Left;  . PERIPHERAL VASCULAR CATHETERIZATION Left 12/03/2015   Procedure: Thrombectomy;  Surgeon: Algernon Huxley, MD;  Location: [place] [facility];  Service: Cardiovascular;  Laterality: Left;  . PERIPHERAL VASCULAR CATHETERIZATION Left 01/28/2016   Procedure: Thrombectomy;  Surgeon: Algernon Huxley, MD;  Location: [place] [facility];  Service: Cardiovascular;  Laterality: Left;  . PERIPHERAL VASCULAR CATHETERIZATION N/A 01/28/2016   Procedure: A/V Shuntogram/Fistulagram;  Surgeon: Algernon Huxley, MD;  Location: [place] [facility];  Service: Cardiovascular;  Laterality: N/A;  . SKIN SPLIT GRAFT Right 05/24/2018   Procedure: SKIN GRAFT SPLIT THICKNESS ( RIGHT CALF);  Surgeon: Algernon Huxley, MD;  Location: [facility];  Service: Vascular;  Laterality: Right;  . UPPER EXTREMITY ANGIOGRAPHY  10/24/2018   Procedure: Upper Extremity Angiography;  Surgeon: Algernon Huxley, MD;  Location: [place] [facility];  Service: Cardiovascular;;  . UPPER EXTREMITY ANGIOGRAPHY Right 02/14/2019   Procedure: UPPER EXTREMITY ANGIOGRAPHY;  Surgeon: Algernon Huxley, MD;  Location: [place] [facility];  Service: Cardiovascular;  Laterality: Right;  . WOUND DEBRIDEMENT Right 04/11/2018   Procedure: DEBRIDEMENT WOUND calf muscle and skin;  Surgeon: Algernon Huxley, MD;  Location: [facility];  Service: Vascular;  Laterality: Right;    Family History  Problem Relation Age of Onset  . Hypertension Other   . Heart disease Other   . Diabetes Mother     Allergies  Allergen  Reactions  . Shellfish Allergy Anaphylaxis       Assessment & Plan:   1. End stage renal disease ([place]) Recommend:  The patient is doing well and currently has adequate dialysis access. The patient's dialysis center is not reporting any access issues. Flow pattern is stable when compared to the prior ultrasound.  The patient should have a duplex ultrasound of the dialysis access as previously scheduled in addition to noninvasive studies for his lower extremities.    2. Hypertension, unspecified type Continue antihypertensive medications as already ordered, these medications have been reviewed and there are no changes at this time.   3. PVD (peripheral vascular disease) ([place])  Recommend:  The patient has evidence of atherosclerosis of the lower extremities with claudication.  The patient does not voice lifestyle limiting changes at this point in time.  Noninvasive studies do not suggest clinically significant change.  No invasive studies, angiography or surgery at this time The patient should continue walking and begin a more formal exercise program.  The patient should continue antiplatelet therapy and aggressive treatment of the lipid abnormalities  No changes in the patient's medications at this time  The patient should continue wearing graduated compression socks 10-15 mmHg strength to control the mild edema.    Current Outpatient Medications on File Prior to Visit  Medication Sig Dispense Refill  . allopurinol (ZYLOPRIM) 100 MG tablet Take 100 mg by mouth daily.    [name] amLODipine (NORVASC) 5 MG tablet Take 1 tablet (5 mg total) by mouth daily. 30 tablet 0  . aspirin EC 81 MG tablet Take 1 tablet (81 mg total) by mouth daily. 150 tablet 2  . atorvastatin (LIPITOR) 10 MG tablet Take 1 tablet by mouth daily.    . clonazePAM (KLONOPIN) 0.5 MG tablet Take 1 tablet (0.5 mg total) by mouth as needed. 10 tablet 0  . clopidogrel (PLAVIX) 75 MG tablet Take 1 tablet (75 mg total)  by mouth daily. 30 tablet 11  . folic acid-vitamin b complex-vitamin c-selenium-zinc (DIALYVITE) 3 MG TABS tablet Take 1 tablet by mouth daily.     [name] gabapentin (NEURONTIN) 100 MG capsule Take 1 capsule (100 mg total) by mouth at bedtime. (Patient taking differently: Take 100 mg by mouth 2 (two) times daily. )    . HYDROcodone-acetaminophen (NORCO) 5-325 MG tablet Take 1 tablet by mouth every 6 (six) hours as needed for moderate pain. 20 tablet 0  . losartan (COZAAR) 100 MG tablet Take 100 mg by mouth daily.    . metoprolol tartrate (LOPRESSOR) 25 MG tablet Take 25 mg by mouth every evening.    . Multiple Vitamin (MULTIVITAMIN) LIQD Take 5 mLs by mouth daily.    . sevelamer carbonate (RENVELA) 800 MG tablet Take 2,400 mg by mouth 3 (three) times daily with meals.      No current facility-administered medications on file prior to visit.    There are no Patient Instructions on file for this visit. No follow-ups on file.   Kris Hartmann, NP

## 2019-10-17 ENCOUNTER — Ambulatory Visit: Payer: Medicare Other | Admitting: Physical Therapy

## 2019-10-17 ENCOUNTER — Other Ambulatory Visit: Payer: Self-pay

## 2019-10-17 ENCOUNTER — Encounter: Payer: Self-pay | Admitting: Physical Therapy

## 2019-10-17 DIAGNOSIS — R262 Difficulty in walking, not elsewhere classified: Secondary | ICD-10-CM

## 2019-10-17 DIAGNOSIS — R2681 Unsteadiness on feet: Secondary | ICD-10-CM

## 2019-10-17 NOTE — Therapy (Signed)
[place] MAIN [facility] SERVICES [address] [name], [place], [address] Phone: [phone]   Fax:  [phone]  Physical Therapy Treatment  Patient Details  Name: Johnny [name]. MRN: [id number] Date of Birth: April 10, 1951 Referring Provider (PT): Eulogio Ditch, NP    Encounter Date: 10/17/2019   PT End of Session - 10/17/19 1027    Visit Number 31    Number of Visits 41    Date for PT Re-Evaluation 10/22/19    Authorization Type [employer] Medicare    PT Start Time 0920    PT Stop Time 1000    PT Time Calculation (min) 40 min    Equipment Utilized During Treatment Gait belt    Activity Tolerance Patient tolerated treatment well;No increased pain    Behavior During Therapy WFL for tasks assessed/performed           Past Medical History:  Diagnosis Date   Anemia    Anxiety    CHF (congestive heart failure) ([place])    Chronic kidney disease    esrd dialysis m/w/f   Gout    Hyperlipidemia    Hypertension    Myocardial infarction ([place]) 2010   10 years ago   Neuromuscular disorder ([place]) 2020   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   Peripheral vascular disease [facility])     Past Surgical History:  Procedure Laterality Date   A/V FISTULAGRAM Right 09/06/2018   Procedure: A/V FISTULAGRAM;  Surgeon: Algernon Huxley, MD;  Location: [place] [facility];  Service: Cardiovascular;  Laterality: Right;   A/V SHUNTOGRAM Left 06/21/2017   Procedure: A/V SHUNTOGRAM;  Surgeon: Katha Cabal, MD;  Location: [place] [facility];  Service: Cardiovascular;  Laterality: Left;   A/V SHUNTOGRAM N/A 10/24/2018   Procedure: A/V SHUNTOGRAM;  Surgeon: Algernon Huxley, MD;  Location: [place] [facility];  Service: Cardiovascular;  Laterality: N/A;   ABOVE KNEE LEG AMPUTATION Right 2020   AMPUTATION Right 10/25/2018   Procedure: AMPUTATION ABOVE KNEE;  Surgeon: Algernon Huxley, MD;  Location: [facility] ORS;  Service: General;  Laterality: Right;   APPLICATION  OF WOUND VAC Right 04/11/2018   Procedure: APPLICATION OF WOUND VAC;  Surgeon: Algernon Huxley, MD;  Location: [facility] ORS;  Service: Vascular;  Laterality: Right;   AV FISTULA PLACEMENT Left 09/18/2015   Procedure: INSERTION OF ARTERIOVENOUS (AV) GORE-TEX GRAFT ARM ( BRACH/AXILLARY GRAFT W/ INSTANT STICK GRAFT );  Surgeon: Katha Cabal, MD;  Location: [facility];  Service: Vascular;  Laterality: Left;   AV FISTULA PLACEMENT Right 07/19/2018   Procedure: INSERTION OF ARTERIOVENOUS (AV) GORE-TEX GRAFT ARM ( BRACHIAL AXILLARY);  Surgeon: Algernon Huxley, MD;  Location: [facility];  Service: Vascular;  Laterality: Right;   DIALYSIS FISTULA CREATION Right 10/2017   right chest perm cath   DIALYSIS/PERMA CATHETER REMOVAL N/A 09/13/2018   Procedure: DIALYSIS/PERMA CATHETER REMOVAL;  Surgeon: Algernon Huxley, MD;  Location: [place] [facility];  Service: Cardiovascular;  Laterality: N/A;   ESOPHAGOGASTRODUODENOSCOPY N/A 12/19/2017   Procedure: ESOPHAGOGASTRODUODENOSCOPY (EGD);  Surgeon: Lin Landsman, MD;  Location: [facility] ENDOSCOPY;  Service: Gastroenterology;  Laterality: N/A;   LOWER EXTREMITY ANGIOGRAPHY Left 11/16/2017   Procedure: LOWER EXTREMITY ANGIOGRAPHY;  Surgeon: Algernon Huxley, MD;  Location: [place] [facility];  Service: Cardiovascular;  Laterality: Left;   LOWER EXTREMITY ANGIOGRAPHY Right 01/18/2018   Procedure: LOWER EXTREMITY ANGIOGRAPHY;  Surgeon: Algernon Huxley, MD;  Location: [place] [facility];  Service: Cardiovascular;  Laterality: Right;   LOWER EXTREMITY ANGIOGRAPHY Left 04/02/2018   Procedure: LOWER EXTREMITY ANGIOGRAPHY;  Surgeon: Algernon Huxley, MD;  Location: [place] [facility];  Service: Cardiovascular;  Laterality: Left;   LOWER EXTREMITY ANGIOGRAPHY Right 04/09/2018   Procedure: Lower Extremity Angiography with possible intervention;  Surgeon: Algernon Huxley, MD;  Location: [place] [facility];  Service: Cardiovascular;  Laterality: Right;   LOWER EXTREMITY ANGIOGRAPHY Right  07/23/2018   Procedure: Lower Extremity Angiography;  Surgeon: Algernon Huxley, MD;  Location: [place] [facility];  Service: Cardiovascular;  Laterality: Right;   LOWER EXTREMITY ANGIOGRAPHY Right 09/13/2018   Procedure: LOWER EXTREMITY ANGIOGRAPHY;  Surgeon: Algernon Huxley, MD;  Location: [place] [facility];  Service: Cardiovascular;  Laterality: Right;   LOWER EXTREMITY VENOGRAPHY Right 09/13/2018   Procedure: LOWER EXTREMITY VENOGRAPHY;  Surgeon: Algernon Huxley, MD;  Location: [place] [facility];  Service: Cardiovascular;  Laterality: Right;   PERIPHERAL VASCULAR CATHETERIZATION Left 09/01/2015   Procedure: A/V Shuntogram/Fistulagram;  Surgeon: Katha Cabal, MD;  Location: [place] [facility];  Service: Cardiovascular;  Laterality: Left;   PERIPHERAL VASCULAR CATHETERIZATION N/A 09/30/2015   Procedure: A/V Shuntogram/Fistulagram with perm cathether removal;  Surgeon: Algernon Huxley, MD;  Location: [place] [facility];  Service: Cardiovascular;  Laterality: N/A;   PERIPHERAL VASCULAR CATHETERIZATION Left 09/30/2015   Procedure: A/V Shunt Intervention;  Surgeon: Algernon Huxley, MD;  Location: [place] [facility];  Service: Cardiovascular;  Laterality: Left;   PERIPHERAL VASCULAR CATHETERIZATION Left 12/03/2015   Procedure: Thrombectomy;  Surgeon: Algernon Huxley, MD;  Location: [place] [facility];  Service: Cardiovascular;  Laterality: Left;   PERIPHERAL VASCULAR CATHETERIZATION Left 01/28/2016   Procedure: Thrombectomy;  Surgeon: Algernon Huxley, MD;  Location: [place] [facility];  Service: Cardiovascular;  Laterality: Left;   PERIPHERAL VASCULAR CATHETERIZATION N/A 01/28/2016   Procedure: A/V Shuntogram/Fistulagram;  Surgeon: Algernon Huxley, MD;  Location: [place] [facility];  Service: Cardiovascular;  Laterality: N/A;   SKIN SPLIT GRAFT Right 05/24/2018   Procedure: SKIN GRAFT SPLIT THICKNESS ( RIGHT CALF);  Surgeon: Algernon Huxley, MD;  Location: [facility];  Service: Vascular;  Laterality: Right;    UPPER EXTREMITY ANGIOGRAPHY  10/24/2018   Procedure: Upper Extremity Angiography;  Surgeon: Algernon Huxley, MD;  Location: [place] [facility];  Service: Cardiovascular;;   UPPER EXTREMITY ANGIOGRAPHY Right 02/14/2019   Procedure: UPPER EXTREMITY ANGIOGRAPHY;  Surgeon: Algernon Huxley, MD;  Location: [place] [facility];  Service: Cardiovascular;  Laterality: Right;   WOUND DEBRIDEMENT Right 04/11/2018   Procedure: DEBRIDEMENT WOUND calf muscle and skin;  Surgeon: Algernon Huxley, MD;  Location: [facility] OR[facility];  Service: Vascular;  Laterality: Right;    There were no vitals filed for this visit.   Subjective Assessment - 10/17/19 1027    Subjective Patient had his prosthesis adjusted so that it fits better.    Pertinent History Pt underwent Rt AKA amputation in June 2020 after difficulty with wound healing/infection. Pt DC hospital to SNF for rehab. He has since been back at home. Pt uses a manual WC or power chair for daily mobility. Pt takes tranportation services to HD MWF. Pt was recently fitted for his AKA prosthesis in Jan 2021.    Limitations Standing;Walking;House hold activities    How long can you sit comfortably? No difficulty    How long can you stand comfortably? 1 minute    How long can you walk comfortably? unable  Currently in Pain? No/denies    Multiple Pain Sites No           Gait Training Performed gait training with patientusing rolling walker Minimal education today required with patient regarding gait sequencing including advancing walker,unlocking R knee, utilizing momentum to create heel strike on the right, and to lock out right knee.  He demonstrates improved independence today and he does not experience any buckles today.  Improved R heel strike noted today and overall speed is improved as well.  Practiced partial step-through gait with patient todayand it is improved from last session. CGAand wheelchair follow.  He requires some cues for upright posture but less  than previous session. He is able to ambulate150' x 2 with BRW completing laps in the hallway He continues to demonstrateheavy BUE support on walker handles during gait. Fatigue monitored by therapist throughout and one seated rest breaks provided between reps.    Transfers power wc to standing in the parallel bars and gait training in parallel bars 10 feet x 4 with good heel strike and CGA   Patient performed with instruction, verbal cues, tactile cues of therapist: goal: increase tissue extensibility, promote proper posture, improve mobility                     PT Education - 10/17/19 1027    Education Details HEP    Person(s) Educated Patient    Methods Explanation    Comprehension Verbalized understanding;Need further instruction            PT Short Term Goals - 08/29/19 0826      PT SHORT TERM GOAL #1   Title Pt to demonstrate ability to perfrom STS transfer from chair c RW minGuard assist and lock his RLE knee joint without assist.    Baseline ModA from elevated surface at eval; 08/29/19: minA+1 with BUE support from regular height chair with arm rests   Now able to perform with supervision from chair +2 airexpads.   Time 4    Period Weeks    Status Partially Met    Target Date 06/25/19      PT SHORT TERM GOAL #2   Title Pt to demonstrate 5 degrees hip flexion 10 degrees hip abdct ROM.    Baseline lacks 5 degrees from neutral in both at eval; At visit 10, has 0 degrees flexion/extension; still lacking 5 degrees ABDCT neutral; 08/29/19: R hip: lacking 5 degrees of extension, 15 degrees abduction    Time 4    Period Weeks    Status Achieved    Target Date 06/25/19             PT Long Term Goals - 10/15/19 0934      PT LONG TERM GOAL #1   Title Pt to demonstrates 15 degrees Rt hip ABDCT, 10 degrees Rt hip extension P/ROM to facilitate prosthesis motor control.    Baseline 08/29/19: R hip: lacking 5 degrees of extension, 15 degrees abduction, 10/15/19=  R hip lacking 10 deg extension    Time 8    Period Weeks    Status Partially Met    Target Date 12/17/19      PT LONG TERM GOAL #2   Title Pt to demonstrate 5/5 strength hip extension at 0 degrees flexion/extension    Baseline has 5/5 in some ranges, but not in ranges specific to standing or gait.; 08/29/19: Unable to get to neutral flexion extension but 5/5 R hip extension in other ranges,  10/15/19= -3/5 right hip extension    Time 8    Period Weeks    Status On-going    Target Date 12/17/19      PT LONG TERM GOAL #3   Title Pt able to perfrom 5x STS from chair with RW, arms ad lib in <21sec    Baseline At visit 10 requires chair +2 pads +RW; 08/29/19: minA+1 with BUE support from regular height chair with arm rests to stand, unable to perform 5TSTS, 10/15/19=continues to need assist for sit to stand    Time 8    Period Weeks    Status Partially Met    Target Date 12/17/19      PT LONG TERM GOAL #4   Title Pt will be able to ambulate at least 100' with RW and supervision with proper locking of R prosthesis in order to demonstrate safe household ambulation in order to improve independence with gait without need for power wheelchair    Baseline 08/29/19: Pt has ambulated 58' with CGA and wheelchair follow with RW but episodes of buckling;10/15/19= pt is ambulating in parallel bars due to prosthesis not fiting properly and needs the safety    Time 12    Period Weeks    Status Partially Met    Target Date 12/17/19                 Plan - 10/17/19 1032    Clinical Impression Statement  Pt was able to perform all gait training  today with CGA.[name] Pt was able to perform gait exercises, demonstrating improvements in LE strength and stability.    Pt requires verbal, visual and tactile cues during exercise in order to complete tasks with proper form and technique, as well as to stay on task.  Pt would continue to benefit from skilled PT services in order to further strengthen LE's, improve static  and dynamic balance, and improve coordination in order to increase functional mobility and decrease risk of falls   Personal Factors and Comorbidities Age;Fitness;Past/Current Experience;Education;Transportation    Examination-Activity Limitations Transfers;Dressing;Squat;Stairs;Stand;Bed Mobility    Examination-Participation Restrictions Meal Prep;Cleaning;Community Activity;Driving;Yard Work;Laundry;Shop    Stability/Clinical Decision Making Stable/Uncomplicated    Rehab Potential Good    PT Frequency 2x / week    PT Duration 12 weeks    PT Treatment/Interventions ADLs/Self Care Home Management;Cryotherapy;Electrical Stimulation;DME Instruction;Gait training;Stair training;Functional mobility training;Therapeutic activities;Therapeutic exercise;Balance training;Neuromuscular re-education;Cognitive remediation;Patient/family education;Prosthetic Training;Wheelchair mobility training;Passive range of motion;Dry needling;Scar mobilization;Spinal Manipulations;Joint Manipulations    PT Next Visit Plan Strength, balance, transfers, and gait training with CGA and wheelchair follow    PT Home Exercise Plan Medbridge Access Code: [email or web address] (prone positioning), L single leg bridges, L sidelying R hip abduction, hip isometric adduction    Consulted and Agree with Plan of Care Patient           Patient will benefit from skilled therapeutic intervention in order to improve the following deficits and impairments:  Decreased activity tolerance, Decreased balance, Decreased mobility, Decreased strength, Decreased cognition, Decreased knowledge of use of DME, Decreased knowledge of precautions, Decreased endurance, Decreased range of motion, Difficulty walking, Decreased safety awareness, Decreased skin integrity, Decreased scar mobility, Hypomobility, Postural dysfunction, Increased edema, Increased muscle spasms, Increased fascial restricitons, Prosthetic Dependency, Impaired tone, Improper body  mechanics  Visit Diagnosis: Difficulty in walking, not elsewhere classified  Unsteadiness on feet     Problem List Patient Active Problem List   Diagnosis Date Noted   Cardiomyopathy ([place]) 01/22/2019   CHF (congestive heart  failure) ([place]) 01/22/2019   Coronary disease 01/22/2019   Above knee amputation of right lower extremity ([place]) 11/13/2018   Leg ulcer, right, with fat layer exposed ([place]) 10/22/2018   Cellulitis of right leg 07/21/2018   Lower limb ulcer, calf, right, limited to breakdown of skin ([place]) 06/08/2018   PVD (peripheral vascular disease) ([place]) 04/17/2018   Malnutrition of moderate degree 04/11/2018   Pressure injury of skin 04/06/2018   Altered mental status 04/04/2018   Hypothermia 04/04/2018   Hemodialysis graft malfunction ([place]) 03/26/2018   Hypercholesterolemia 02/27/2018   Diabetes ([place]) 02/27/2018   Weakness of right lower extremity 01/20/2018   Fever    Periumbilical abdominal pain    Confusion 12/22/2017   Acute delirium 12/21/2017   Protein-calorie malnutrition, severe 12/19/2017   Intractable nausea and vomiting 12/18/2017   Lymphedema 12/13/2017   Cellulitis 11/27/2017   Chest pain 11/19/2017   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 [place], [place], [address] Phone: [phone]   Fax:  [phone]  Name: Zealand Boyett. MRN: [id number] Date of Birth: [birth year]

## 2019-10-22 ENCOUNTER — Encounter: Payer: Self-pay | Admitting: Physical Therapy

## 2019-10-22 ENCOUNTER — Ambulatory Visit: Payer: Medicare Other | Admitting: Physical Therapy

## 2019-10-22 ENCOUNTER — Other Ambulatory Visit: Payer: Self-pay

## 2019-10-22 DIAGNOSIS — R2681 Unsteadiness on feet: Secondary | ICD-10-CM

## 2019-10-22 DIAGNOSIS — R262 Difficulty in walking, not elsewhere classified: Secondary | ICD-10-CM | POA: Diagnosis not present

## 2019-10-22 NOTE — Therapy (Signed)
[place] MAIN [facility] SERVICES [address] [place], [place], [address] Phone: [phone]   Fax:  [phone]  Physical Therapy Treatment  Patient Details  Name: Johnny [name]. MRN: [id number] Date of Birth: 1950/07/03 Referring Provider (PT): Eulogio Ditch, NP    Encounter Date: 10/22/2019   PT End of Session - 10/22/19 1038    Visit Number 32    Number of Visits 41    Date for PT Re-Evaluation 10/22/19    Authorization Type [employer] Medicare    PT Start Time 0845    PT Stop Time 0929    PT Time Calculation (min) 44 min    Equipment Utilized During Treatment Gait belt    Activity Tolerance Patient tolerated treatment well;No increased pain    Behavior During Therapy WFL for tasks assessed/performed           Past Medical History:  Diagnosis Date  . Anemia   . Anxiety   . CHF (congestive heart failure) ([place])   . Chronic kidney disease    esrd dialysis m/w/f  . Gout   . Hyperlipidemia   . Hypertension   . Myocardial infarction ([place]) 2010   10 years ago  . 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  . Peripheral vascular disease [facility])     Past Surgical History:  Procedure Laterality Date  . A/V FISTULAGRAM Right 09/06/2018   Procedure: A/V FISTULAGRAM;  Surgeon: Algernon Huxley, MD;  Location: [place] CV LAB;  Service: Cardiovascular;  Laterality: Right;  . A/V SHUNTOGRAM Left 06/21/2017   Procedure: A/V SHUNTOGRAM;  Surgeon: Katha Cabal, MD;  Location: [place] CV LAB;  Service: Cardiovascular;  Laterality: Left;  . A/V SHUNTOGRAM N/A 10/24/2018   Procedure: A/V SHUNTOGRAM;  Surgeon: Algernon Huxley, MD;  Location: [place] CV LAB;  Service: Cardiovascular;  Laterality: N/A;  . ABOVE KNEE LEG AMPUTATION Right 2020  . AMPUTATION Right 10/25/2018   Procedure: AMPUTATION ABOVE KNEE;  Surgeon: Algernon Huxley, MD;  Location: [facility] ORS;  Service: General;  Laterality: Right;  . APPLICATION  OF WOUND VAC Right 04/11/2018   Procedure: APPLICATION OF WOUND VAC;  Surgeon: Algernon Huxley, MD;  Location: [facility] ORS;  Service: Vascular;  Laterality: Right;  . AV FISTULA PLACEMENT Left 09/18/2015   Procedure: INSERTION OF ARTERIOVENOUS (AV) GORE-TEX GRAFT ARM ( BRACH/AXILLARY GRAFT W/ INSTANT STICK GRAFT );  Surgeon: Katha Cabal, MD;  Location: [facility] ORS;  Service: Vascular;  Laterality: Left;  . AV FISTULA PLACEMENT Right 07/19/2018   Procedure: INSERTION OF ARTERIOVENOUS (AV) GORE-TEX GRAFT ARM ( BRACHIAL AXILLARY);  Surgeon: Algernon Huxley, MD;  Location: [facility] ORS;  Service: Vascular;  Laterality: Right;  . DIALYSIS FISTULA CREATION Right 10/2017   right chest perm cath  . DIALYSIS/PERMA CATHETER REMOVAL N/A 09/13/2018   Procedure: DIALYSIS/PERMA CATHETER REMOVAL;  Surgeon: Algernon Huxley, MD;  Location: [place] CV LAB;  Service: Cardiovascular;  Laterality: N/A;  . ESOPHAGOGASTRODUODENOSCOPY N/A 12/19/2017   Procedure: ESOPHAGOGASTRODUODENOSCOPY (EGD);  Surgeon: Lin Landsman, MD;  Location: [facility] ENDOSCOPY;  Service: Gastroenterology;  Laterality: N/A;  . LOWER EXTREMITY ANGIOGRAPHY Left 11/16/2017   Procedure: LOWER EXTREMITY ANGIOGRAPHY;  Surgeon: Algernon Huxley, MD;  Location: [place] CV LAB;  Service: Cardiovascular;  Laterality: Left;  . LOWER EXTREMITY ANGIOGRAPHY Right 01/18/2018   Procedure: LOWER EXTREMITY ANGIOGRAPHY;  Surgeon: Algernon Huxley, MD;  Location: [place] CV LAB;  Service: Cardiovascular;  Laterality: Right;  . LOWER EXTREMITY ANGIOGRAPHY Left 04/02/2018   Procedure: LOWER EXTREMITY ANGIOGRAPHY;  Surgeon: Algernon Huxley, MD;  Location: [place] CV LAB;  Service: Cardiovascular;  Laterality: Left;  . LOWER EXTREMITY ANGIOGRAPHY Right 04/09/2018   Procedure: Lower Extremity Angiography with possible intervention;  Surgeon: Algernon Huxley, MD;  Location: [place] CV LAB;  Service: Cardiovascular;  Laterality: Right;  . LOWER EXTREMITY ANGIOGRAPHY Right  07/23/2018   Procedure: Lower Extremity Angiography;  Surgeon: Algernon Huxley, MD;  Location: [place] CV LAB;  Service: Cardiovascular;  Laterality: Right;  . LOWER EXTREMITY ANGIOGRAPHY Right 09/13/2018   Procedure: LOWER EXTREMITY ANGIOGRAPHY;  Surgeon: Algernon Huxley, MD;  Location: [place] CV LAB;  Service: Cardiovascular;  Laterality: Right;  . LOWER EXTREMITY VENOGRAPHY Right 09/13/2018   Procedure: LOWER EXTREMITY VENOGRAPHY;  Surgeon: Algernon Huxley, MD;  Location: [place] CV LAB;  Service: Cardiovascular;  Laterality: Right;  . PERIPHERAL VASCULAR CATHETERIZATION Left 09/01/2015   Procedure: A/V Shuntogram/Fistulagram;  Surgeon: Katha Cabal, MD;  Location: [place] [facility] LAB;  Service: Cardiovascular;  Laterality: Left;  . PERIPHERAL VASCULAR CATHETERIZATION N/A 09/30/2015   Procedure: A/V Shuntogram/Fistulagram with perm cathether removal;  Surgeon: Algernon Huxley, MD;  Location: [place] CV LAB;  Service: Cardiovascular;  Laterality: N/A;  . PERIPHERAL VASCULAR CATHETERIZATION Left 09/30/2015   Procedure: A/V Shunt Intervention;  Surgeon: Algernon Huxley, MD;  Location: [place] CV LAB;  Service: Cardiovascular;  Laterality: Left;  . PERIPHERAL VASCULAR CATHETERIZATION Left 12/03/2015   Procedure: Thrombectomy;  Surgeon: Algernon Huxley, MD;  Location: [place] CV LAB;  Service: Cardiovascular;  Laterality: Left;  . PERIPHERAL VASCULAR CATHETERIZATION Left 01/28/2016   Procedure: Thrombectomy;  Surgeon: Algernon Huxley, MD;  Location: [place] [facility] LAB;  Service: Cardiovascular;  Laterality: Left;  . PERIPHERAL VASCULAR CATHETERIZATION N/A 01/28/2016   Procedure: A/V Shuntogram/Fistulagram;  Surgeon: Algernon Huxley, MD;  Location: [place] CV LAB;  Service: Cardiovascular;  Laterality: N/A;  . SKIN SPLIT GRAFT Right 05/24/2018   Procedure: SKIN GRAFT SPLIT THICKNESS ( RIGHT CALF);  Surgeon: Algernon Huxley, MD;  Location: [facility] ORS;  Service: Vascular;  Laterality: Right;  .  UPPER EXTREMITY ANGIOGRAPHY  10/24/2018   Procedure: Upper Extremity Angiography;  Surgeon: Algernon Huxley, MD;  Location: [place] CV LAB;  Service: Cardiovascular;;  . UPPER EXTREMITY ANGIOGRAPHY Right 02/14/2019   Procedure: UPPER EXTREMITY ANGIOGRAPHY;  Surgeon: Algernon Huxley, MD;  Location: [place] [facility] LAB;  Service: Cardiovascular;  Laterality: Right;  . WOUND DEBRIDEMENT Right 04/11/2018   Procedure: DEBRIDEMENT WOUND calf muscle and skin;  Surgeon: Algernon Huxley, MD;  Location: [facility] ORS;  Service: Vascular;  Laterality: Right;    There were no vitals filed for this visit.   Subjective Assessment - 10/22/19 1037    Subjective Patient had his prosthesis adjusted so that it fits better.    Pertinent History Pt underwent Rt AKA amputation in June 2020 after difficulty with wound healing/infection. Pt DC hospital to SNF for rehab. He has since been back at home. Pt uses a manual WC or power chair for daily mobility. Pt takes tranportation services to HD MWF. Pt was recently fitted for his AKA prosthesis in Jan 2021.    Limitations Standing;Walking;House hold activities    How long can you sit comfortably? No difficulty    How long can you stand comfortably? 1 minute    How long can you walk comfortably? unable  Currently in Pain? No/denies    Multiple Pain Sites No              Gait Training Performed gait training with patientusing rolling walker Minimal education today required with patient regarding gait sequencing including advancing walker,unlocking R knee, utilizing momentum to create heel strike on the right, and to lock out right knee. Patient continues to need safety for locking wc and for not having uncontrolled sitting with transfer stand to sit in wc.   He demonstrates improved independence today and he does not experience any buckles today. Improved R heel strike noted today and overall speed is improved as well. Practiced partial step-through gait with  patient todayand it is improved from last session. CGAand wheelchair follow.  Herequires some cues for upright posture but less than previous session. He is able to ambulate150'x 4with BRW completinglaps in the hallway He continues to demonstrateheavy BUE support on walker handles during gait. Fatigue monitored by therapist throughout and one seated rest breaks provided between reps.  Transfers power wc to standing in the parallel bars and gait training in parallel bars 10 feet x 4 with good heel strike and CGA  Patient performed with instruction, verbal cues, tactile cues of therapist: goal:increase tissue extensibility, promote proper posture, improve mobility                        PT Education - 10/22/19 1037    Education Details HEP    Person(s) Educated Patient    Methods Explanation    Comprehension Verbalized understanding            PT Short Term Goals - 08/29/19 0826      PT SHORT TERM GOAL #1   Title Pt to demonstrate ability to perfrom STS transfer from chair c RW minGuard assist and lock his RLE knee joint without assist.    Baseline ModA from elevated surface at eval; 08/29/19: minA+1 with BUE support from regular height chair with arm rests   Now able to perform with supervision from chair +2 airexpads.   Time 4    Period Weeks    Status Partially Met    Target Date 06/25/19      PT SHORT TERM GOAL #2   Title Pt to demonstrate 5 degrees hip flexion 10 degrees hip abdct ROM.    Baseline lacks 5 degrees from neutral in both at eval; At visit 10, has 0 degrees flexion/extension; still lacking 5 degrees ABDCT neutral; 08/29/19: R hip: lacking 5 degrees of extension, 15 degrees abduction    Time 4    Period Weeks    Status Achieved    Target Date 06/25/19             PT Long Term Goals - 10/15/19 0934      PT LONG TERM GOAL #1   Title Pt to demonstrates 15 degrees Rt hip ABDCT, 10 degrees Rt hip extension P/ROM to facilitate  prosthesis motor control.    Baseline 08/29/19: R hip: lacking 5 degrees of extension, 15 degrees abduction, 10/15/19= R hip lacking 10 deg extension    Time 8    Period Weeks    Status Partially Met    Target Date 12/17/19      PT LONG TERM GOAL #2   Title Pt to demonstrate 5/5 strength hip extension at 0 degrees flexion/extension    Baseline has 5/5 in some ranges, but not in ranges specific to standing or gait.;  08/29/19: Unable to get to neutral flexion extension but 5/5 R hip extension in other ranges, 10/15/19= -3/5 right hip extension    Time 8    Period Weeks    Status On-going    Target Date 12/17/19      PT LONG TERM GOAL #3   Title Pt able to perfrom 5x STS from chair with RW, arms ad lib in <21sec    Baseline At visit 10 requires chair +2 pads +RW; 08/29/19: minA+1 with BUE support from regular height chair with arm rests to stand, unable to perform 5TSTS, 10/15/19=continues to need assist for sit to stand    Time 8    Period Weeks    Status Partially Met    Target Date 12/17/19      PT LONG TERM GOAL #4   Title Pt will be able to ambulate at least 100' with RW and supervision with proper locking of R prosthesis in order to demonstrate safe household ambulation in order to improve independence with gait without need for power wheelchair    Baseline 08/29/19: Pt has ambulated 33' with CGA and wheelchair follow with RW but episodes of buckling;10/15/19= pt is ambulating in parallel bars due to prosthesis not fiting properly and needs the safety    Time 12    Period Weeks    Status Partially Met    Target Date 12/17/19                 Plan - 10/22/19 1042    Clinical Impression Statement Pt is making improvements in BLE strength and balance as evidenced by improvements in gait and  gait speed.  Patient can use the rollator more and home and increased gait speed.  Patient will benefit from continued skilled PT interventions for improved strength, balance, and QOL   Personal  Factors and Comorbidities Age;Fitness;Past/Current Experience;Education;Transportation    Examination-Activity Limitations Transfers;Dressing;Squat;Stairs;Stand;Bed Mobility    Examination-Participation Restrictions Meal Prep;Cleaning;Community Activity;Driving;Yard Work;Laundry;Shop    Stability/Clinical Decision Making Stable/Uncomplicated    Rehab Potential Good    PT Frequency 2x / week    PT Duration 12 weeks    PT Treatment/Interventions ADLs/Self Care Home Management;Cryotherapy;Electrical Stimulation;DME Instruction;Gait training;Stair training;Functional mobility training;Therapeutic activities;Therapeutic exercise;Balance training;Neuromuscular re-education;Cognitive remediation;Patient/family education;Prosthetic Training;Wheelchair mobility training;Passive range of motion;Dry needling;Scar mobilization;Spinal Manipulations;Joint Manipulations    PT Next Visit Plan Strength, balance, transfers, and gait training with CGA and wheelchair follow    PT Home Exercise Plan Medbridge Access Code: [email or web address] (prone positioning), L single leg bridges, L sidelying R hip abduction, hip isometric adduction    Consulted and Agree with Plan of Care Patient           Patient will benefit from skilled therapeutic intervention in order to improve the following deficits and impairments:  Decreased activity tolerance, Decreased balance, Decreased mobility, Decreased strength, Decreased cognition, Decreased knowledge of use of DME, Decreased knowledge of precautions, Decreased endurance, Decreased range of motion, Difficulty walking, Decreased safety awareness, Decreased skin integrity, Decreased scar mobility, Hypomobility, Postural dysfunction, Increased edema, Increased muscle spasms, Increased fascial restricitons, Prosthetic Dependency, Impaired tone, Improper body mechanics  Visit Diagnosis: Difficulty in walking, not elsewhere classified  Unsteadiness on feet     Problem List Patient Active  Problem List   Diagnosis Date Noted  . Cardiomyopathy ([place]) 01/22/2019  . CHF (congestive heart failure) ([place]) 01/22/2019  . Coronary disease 01/22/2019  . Above knee amputation of right lower extremity ([place]) 11/13/2018  . Leg ulcer, right, with fat layer exposed (  [place]) 10/22/2018  . Cellulitis of right leg 07/21/2018  . Lower limb ulcer, calf, right, limited to breakdown of skin ([place]) 06/08/2018  . PVD (peripheral vascular disease) ([place]) 04/17/2018  . Malnutrition of moderate degree 04/11/2018  . Pressure injury of skin 04/06/2018  . Altered mental status 04/04/2018  . Hypothermia 04/04/2018  . Hemodialysis graft malfunction ([place]) 03/26/2018  . Hypercholesterolemia 02/27/2018  . Diabetes ([place]) 02/27/2018  . Weakness of right lower extremity 01/20/2018  . Fever   . Periumbilical abdominal pain   . Confusion 12/22/2017  . Acute delirium 12/21/2017  . Protein-calorie malnutrition, severe 12/19/2017  . Intractable nausea and vomiting 12/18/2017  . Lymphedema 12/13/2017  . Cellulitis 11/27/2017  . Chest pain 11/19/2017  . Atherosclerosis of native arteries of the extremities with ulceration ([place]) 11/07/2017  . Twitching 01/03/2017  . Elevated troponin 10/02/2015  . Complications, dialysis, catheter, mechanical ([place]) 10/02/2015  . Musculoskeletal chest pain 09/28/2015  . Chronic diastolic CHF (congestive heart failure) ([place]) 09/28/2015  . End stage renal disease ([place]) 10/09/2012  . Hypertension 10/09/2012  . Gout 10/09/2012    Alanson Puls, [place] DPT 10/22/2019, 10:42 AM  [place] MAIN [facility] SERVICES [address] [place], [place], [address] Phone: [phone]   Fax:  [phone]  Name: Johnny [name]. MRN: [id number] Date of Birth: 06-07-1950

## 2019-10-24 ENCOUNTER — Ambulatory Visit: Payer: Medicare Other | Admitting: Physical Therapy

## 2019-10-24 ENCOUNTER — Encounter: Payer: Self-pay | Admitting: Physical Therapy

## 2019-10-24 ENCOUNTER — Other Ambulatory Visit: Payer: Self-pay

## 2019-10-24 DIAGNOSIS — R2681 Unsteadiness on feet: Secondary | ICD-10-CM

## 2019-10-24 DIAGNOSIS — R262 Difficulty in walking, not elsewhere classified: Secondary | ICD-10-CM

## 2019-10-24 NOTE — Therapy (Signed)
[place] MAIN [facility] SERVICES [address] [place], [place], [address] Phone: [phone]   Fax:  [phone]  Physical Therapy Treatment  Patient Details  Name: Johnny [name]. MRN: [id number] Date of Birth: [birth year] Referring Provider (PT): Eulogio Ditch, NP    Encounter Date: 10/24/2019   PT End of Session - 10/24/19 0839    Visit Number 33    Number of Visits 41    Date for PT Re-Evaluation 10/22/19    Authorization Type [employer] Medicare    PT Start Time 0845    PT Stop Time 0925    PT Time Calculation (min) 40 min    Equipment Utilized During Treatment Gait belt    Activity Tolerance Patient tolerated treatment well;No increased pain    Behavior During Therapy WFL for tasks assessed/performed           Past Medical History:  Diagnosis Date  . Anemia   . Anxiety   . CHF (congestive heart failure) ([place])   . Chronic kidney disease    esrd dialysis m/w/f  . Gout   . Hyperlipidemia   . Hypertension   . Myocardial infarction ([place]) 2010   10 years ago  . 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  . Peripheral vascular disease [facility])     Past Surgical History:  Procedure Laterality Date  . A/V FISTULAGRAM Right 09/06/2018   Procedure: A/V FISTULAGRAM;  Surgeon: Algernon Huxley, MD;  Location: [place] [facility];  Service: Cardiovascular;  Laterality: Right;  . A/V SHUNTOGRAM Left 06/21/2017   Procedure: A/V SHUNTOGRAM;  Surgeon: Katha Cabal, MD;  Location: [place] [facility];  Service: Cardiovascular;  Laterality: Left;  . A/V SHUNTOGRAM N/A 10/24/2018   Procedure: A/V SHUNTOGRAM;  Surgeon: Algernon Huxley, MD;  Location: [place] [facility];  Service: Cardiovascular;  Laterality: N/A;  . ABOVE KNEE LEG AMPUTATION Right 2020  . AMPUTATION Right 10/25/2018   Procedure: AMPUTATION ABOVE KNEE;  Surgeon: Algernon Huxley, MD;  Location: [facility] OR[facility];  Service: General;  Laterality: Right;  . APPLICATION  OF WOUND VAC Right 04/11/2018   Procedure: APPLICATION OF WOUND VAC;  Surgeon: Algernon Huxley, MD;  Location: [facility];  Service: Vascular;  Laterality: Right;  . AV FISTULA PLACEMENT Left 09/18/2015   Procedure: INSERTION OF ARTERIOVENOUS (AV) GORE-TEX GRAFT ARM ( BRACH/AXILLARY GRAFT W/ INSTANT STICK GRAFT );  Surgeon: Katha Cabal, MD;  Location: [facility];  Service: Vascular;  Laterality: Left;  . AV FISTULA PLACEMENT Right 07/19/2018   Procedure: INSERTION OF ARTERIOVENOUS (AV) GORE-TEX GRAFT ARM ( BRACHIAL AXILLARY);  Surgeon: Algernon Huxley, MD;  Location: [facility];  Service: Vascular;  Laterality: Right;  . DIALYSIS FISTULA CREATION Right 10/2017   right chest perm cath  . DIALYSIS/PERMA CATHETER REMOVAL N/A 09/13/2018   Procedure: DIALYSIS/PERMA CATHETER REMOVAL;  Surgeon: Algernon Huxley, MD;  Location: [place] [facility];  Service: Cardiovascular;  Laterality: N/A;  . ESOPHAGOGASTRODUODENOSCOPY N/A 12/19/2017   Procedure: ESOPHAGOGASTRODUODENOSCOPY (EGD);  Surgeon: Lin Landsman, MD;  Location: [facility] ENDOSCOPY;  Service: Gastroenterology;  Laterality: N/A;  . LOWER EXTREMITY ANGIOGRAPHY Left 11/16/2017   Procedure: LOWER EXTREMITY ANGIOGRAPHY;  Surgeon: Algernon Huxley, MD;  Location: [place] [facility];  Service: Cardiovascular;  Laterality: Left;  . LOWER EXTREMITY ANGIOGRAPHY Right 01/18/2018   Procedure: LOWER EXTREMITY ANGIOGRAPHY;  Surgeon: Algernon Huxley, MD;  Location: [place] [facility];  Service: Cardiovascular;  Laterality: Right;  . LOWER EXTREMITY ANGIOGRAPHY Left 04/02/2018   Procedure: LOWER EXTREMITY ANGIOGRAPHY;  Surgeon: Algernon Huxley, MD;  Location: [place] [facility];  Service: Cardiovascular;  Laterality: Left;  . LOWER EXTREMITY ANGIOGRAPHY Right 04/09/2018   Procedure: Lower Extremity Angiography with possible intervention;  Surgeon: Algernon Huxley, MD;  Location: [place] [facility];  Service: Cardiovascular;  Laterality: Right;  . LOWER EXTREMITY ANGIOGRAPHY Right  07/23/2018   Procedure: Lower Extremity Angiography;  Surgeon: Algernon Huxley, MD;  Location: [place] [facility];  Service: Cardiovascular;  Laterality: Right;  . LOWER EXTREMITY ANGIOGRAPHY Right 09/13/2018   Procedure: LOWER EXTREMITY ANGIOGRAPHY;  Surgeon: Algernon Huxley, MD;  Location: [place] [facility];  Service: Cardiovascular;  Laterality: Right;  . LOWER EXTREMITY VENOGRAPHY Right 09/13/2018   Procedure: LOWER EXTREMITY VENOGRAPHY;  Surgeon: Algernon Huxley, MD;  Location: [place] [facility];  Service: Cardiovascular;  Laterality: Right;  . PERIPHERAL VASCULAR CATHETERIZATION Left 09/01/2015   Procedure: A/V Shuntogram/Fistulagram;  Surgeon: Katha Cabal, MD;  Location: [place] [facility];  Service: Cardiovascular;  Laterality: Left;  . PERIPHERAL VASCULAR CATHETERIZATION N/A 09/30/2015   Procedure: A/V Shuntogram/Fistulagram with perm cathether removal;  Surgeon: Algernon Huxley, MD;  Location: [place] [facility];  Service: Cardiovascular;  Laterality: N/A;  . PERIPHERAL VASCULAR CATHETERIZATION Left 09/30/2015   Procedure: A/V Shunt Intervention;  Surgeon: Algernon Huxley, MD;  Location: [place] [facility];  Service: Cardiovascular;  Laterality: Left;  . PERIPHERAL VASCULAR CATHETERIZATION Left 12/03/2015   Procedure: Thrombectomy;  Surgeon: Algernon Huxley, MD;  Location: [place] [facility];  Service: Cardiovascular;  Laterality: Left;  . PERIPHERAL VASCULAR CATHETERIZATION Left 01/28/2016   Procedure: Thrombectomy;  Surgeon: Algernon Huxley, MD;  Location: [place] [facility];  Service: Cardiovascular;  Laterality: Left;  . PERIPHERAL VASCULAR CATHETERIZATION N/A 01/28/2016   Procedure: A/V Shuntogram/Fistulagram;  Surgeon: Algernon Huxley, MD;  Location: [place] [facility];  Service: Cardiovascular;  Laterality: N/A;  . SKIN SPLIT GRAFT Right 05/24/2018   Procedure: SKIN GRAFT SPLIT THICKNESS ( RIGHT CALF);  Surgeon: Algernon Huxley, MD;  Location: [facility];  Service: Vascular;  Laterality: Right;  .  UPPER EXTREMITY ANGIOGRAPHY  10/24/2018   Procedure: Upper Extremity Angiography;  Surgeon: Algernon Huxley, MD;  Location: [place] [facility];  Service: Cardiovascular;;  . UPPER EXTREMITY ANGIOGRAPHY Right 02/14/2019   Procedure: UPPER EXTREMITY ANGIOGRAPHY;  Surgeon: Algernon Huxley, MD;  Location: [place] [facility];  Service: Cardiovascular;  Laterality: Right;  . WOUND DEBRIDEMENT Right 04/11/2018   Procedure: DEBRIDEMENT WOUND calf muscle and skin;  Surgeon: Algernon Huxley, MD;  Location: [facility];  Service: Vascular;  Laterality: Right;    There were no vitals filed for this visit.   Subjective Assessment - 10/24/19 0839    Subjective Patient had his prosthesis adjusted so that it fits better.    Pertinent History Pt underwent Rt AKA amputation in June 2020 after difficulty with wound healing/infection. Pt DC hospital to SNF for rehab. He has since been back at home. Pt uses a manual WC or power chair for daily mobility. Pt takes tranportation services to HD MWF. Pt was recently fitted for his AKA prosthesis in Jan 2021.    Limitations Standing;Walking;House hold activities    How long can you sit comfortably? No difficulty    How long can you stand comfortably? 1 minute    How long can you walk comfortably? unable  Currently in Pain? No/denies    Multiple Pain Sites No           Gait Training Performed gait training with patientusing rolling walker Mod education today required for safety. Patient continues to need safety for locking wc and for not having uncontrolled sitting with transfer stand to sit in wc. He stands up without the RW nearby to begin to transfer when equipment is not all the way set up. He demonstrates improved speed with locking brace today and he does not experience any buckles today. Improved R heel strike noted today and overall speed is improved as well. Practiced partial step-through gait with patient todayand it is improved from last session. CGAand  wheelchair follow.  Herequires some cues for upright posture but less than previous session. He is able to ambulate150'x 4with BRWcompletinglaps in the hallway, needs more cues for safety with turning 180 deg for left foot placement He continues to demonstrateheavy BUE support on walker handles during gait. Fatigue monitored by therapist throughout and one seated rest breaks provided between reps.  Patient performed with instruction, verbal cues, tactile cues of therapist: goal:increase tissue extensibility, promote proper posture, improve mobility                          PT Education - 10/24/19 0839    Education Details HEP    Person(s) Educated Patient    Methods Explanation    Comprehension Verbalized understanding            PT Short Term Goals - 08/29/19 0826      PT SHORT TERM GOAL #1   Title Pt to demonstrate ability to perfrom STS transfer from chair c RW minGuard assist and lock his RLE knee joint without assist.    Baseline ModA from elevated surface at eval; 08/29/19: minA+1 with BUE support from regular height chair with arm rests   Now able to perform with supervision from chair +2 airexpads.   Time 4    Period Weeks    Status Partially Met    Target Date 06/25/19      PT SHORT TERM GOAL #2   Title Pt to demonstrate 5 degrees hip flexion 10 degrees hip abdct ROM.    Baseline lacks 5 degrees from neutral in both at eval; At visit 10, has 0 degrees flexion/extension; still lacking 5 degrees ABDCT neutral; 08/29/19: R hip: lacking 5 degrees of extension, 15 degrees abduction    Time 4    Period Weeks    Status Achieved    Target Date 06/25/19             PT Long Term Goals - 10/15/19 0934      PT LONG TERM GOAL #1   Title Pt to demonstrates 15 degrees Rt hip ABDCT, 10 degrees Rt hip extension P/ROM to facilitate prosthesis motor control.    Baseline 08/29/19: R hip: lacking 5 degrees of extension, 15 degrees abduction, 10/15/19= R  hip lacking 10 deg extension    Time 8    Period Weeks    Status Partially Met    Target Date 12/17/19      PT LONG TERM GOAL #2   Title Pt to demonstrate 5/5 strength hip extension at 0 degrees flexion/extension    Baseline has 5/5 in some ranges, but not in ranges specific to standing or gait.; 08/29/19: Unable to get to neutral flexion extension but 5/5 R hip extension in other ranges, 10/15/19= -  3/5 right hip extension    Time 8    Period Weeks    Status On-going    Target Date 12/17/19      PT LONG TERM GOAL #3   Title Pt able to perfrom 5x STS from chair with RW, arms ad lib in <21sec    Baseline At visit 10 requires chair +2 pads +RW; 08/29/19: minA+1 with BUE support from regular height chair with arm rests to stand, unable to perform 5TSTS, 10/15/19=continues to need assist for sit to stand    Time 8    Period Weeks    Status Partially Met    Target Date 12/17/19      PT LONG TERM GOAL #4   Title Pt will be able to ambulate at least 100' with RW and supervision with proper locking of R prosthesis in order to demonstrate safe household ambulation in order to improve independence with gait without need for power wheelchair    Baseline 08/29/19: Pt has ambulated 85' with CGA and wheelchair follow with RW but episodes of buckling;10/15/19= pt is ambulating in parallel bars due to prosthesis not fiting properly and needs the safety    Time 12    Period Weeks    Status Partially Met    Target Date 12/17/19                 Plan - 10/24/19 0840    Clinical Impression Statement  Pt was able to perform all gait training with min assist, and safety with turning.and safety with transfers.  Pt requires verbal, visual and tactile cues during exercise in order to complete tasks with proper form and technique. Pt would continue to benefit from skilled PT services in order to further strengthen LE's, improve static and dynamic balance, and improve coordination in order to increase functional  mobility and decrease risk of falls   Personal Factors and Comorbidities Age;Fitness;Past/Current Experience;Education;Transportation    Examination-Activity Limitations Transfers;Dressing;Squat;Stairs;Stand;Bed Mobility    Examination-Participation Restrictions Meal Prep;Cleaning;Community Activity;Driving;Yard Work;Laundry;Shop    Stability/Clinical Decision Making Stable/Uncomplicated    Rehab Potential Good    PT Frequency 2x / week    PT Duration 12 weeks    PT Treatment/Interventions ADLs/Self Care Home Management;Cryotherapy;Electrical Stimulation;DME Instruction;Gait training;Stair training;Functional mobility training;Therapeutic activities;Therapeutic exercise;Balance training;Neuromuscular re-education;Cognitive remediation;Patient/family education;Prosthetic Training;Wheelchair mobility training;Passive range of motion;Dry needling;Scar mobilization;Spinal Manipulations;Joint Manipulations    PT Next Visit Plan Strength, balance, transfers, and gait training with CGA and wheelchair follow    PT Home Exercise Plan Medbridge Access Code: [email or web address] (prone positioning), L single leg bridges, L sidelying R hip abduction, hip isometric adduction    Consulted and Agree with Plan of Care Patient           Patient will benefit from skilled therapeutic intervention in order to improve the following deficits and impairments:  Decreased activity tolerance, Decreased balance, Decreased mobility, Decreased strength, Decreased cognition, Decreased knowledge of use of DME, Decreased knowledge of precautions, Decreased endurance, Decreased range of motion, Difficulty walking, Decreased safety awareness, Decreased skin integrity, Decreased scar mobility, Hypomobility, Postural dysfunction, Increased edema, Increased muscle spasms, Increased fascial restricitons, Prosthetic Dependency, Impaired tone, Improper body mechanics  Visit Diagnosis: Unsteadiness on feet  Difficulty in walking, not elsewhere  classified     Problem List Patient Active Problem List   Diagnosis Date Noted  . Cardiomyopathy ([place]) 01/22/2019  . CHF (congestive heart failure) ([place]) 01/22/2019  . Coronary disease 01/22/2019  . Above knee amputation of right lower extremity ([place]) 11/13/2018  .  Leg ulcer, right, with fat layer exposed ([place]) 10/22/2018  . Cellulitis of right leg 07/21/2018  . Lower limb ulcer, calf, right, limited to breakdown of skin ([place]) 06/08/2018  . PVD (peripheral vascular disease) ([place]) 04/17/2018  . Malnutrition of moderate degree 04/11/2018  . Pressure injury of skin 04/06/2018  . Altered mental status 04/04/2018  . Hypothermia 04/04/2018  . Hemodialysis graft malfunction ([place]) 03/26/2018  . Hypercholesterolemia 02/27/2018  . Diabetes ([place]) 02/27/2018  . Weakness of right lower extremity 01/20/2018  . Fever   . Periumbilical abdominal pain   . Confusion 12/22/2017  . Acute delirium 12/21/2017  . Protein-calorie malnutrition, severe 12/19/2017  . Intractable nausea and vomiting 12/18/2017  . Lymphedema 12/13/2017  . Cellulitis 11/27/2017  . Chest pain 11/19/2017  . Atherosclerosis of native arteries of the extremities with ulceration ([place]) 11/07/2017  . Twitching 01/03/2017  . Elevated troponin 10/02/2015  . Complications, dialysis, catheter, mechanical ([place]) 10/02/2015  . Musculoskeletal chest pain 09/28/2015  . Chronic diastolic CHF (congestive heart failure) ([place]) 09/28/2015  . End stage renal disease ([place]) 10/09/2012  . Hypertension 10/09/2012  . Gout 10/09/2012    Alanson Puls, [place] DPT 10/24/2019, 8:42 AM  [place] MAIN [facility] SERVICES [address] [place], [place], [address] Phone: [phone]   Fax:  [phone]  Name: Johnny [name]. MRN: [id number] Date of Birth: 07-21-1950

## 2019-10-29 ENCOUNTER — Ambulatory Visit: Payer: Medicare Other

## 2019-10-29 ENCOUNTER — Other Ambulatory Visit: Payer: Self-pay

## 2019-10-29 DIAGNOSIS — R262 Difficulty in walking, not elsewhere classified: Secondary | ICD-10-CM

## 2019-10-29 DIAGNOSIS — R2681 Unsteadiness on feet: Secondary | ICD-10-CM

## 2019-10-29 NOTE — Therapy (Signed)
[place] MAIN [facility] SERVICES [address] [place], [place], [address] Phone: [phone]   Fax:  [phone]  Physical Therapy Treatment  Patient Details  Name: Johnny [name]. MRN: [id number] Date of Birth: [birth year] Referring Provider (PT): Eulogio Ditch, NP    Encounter Date: 10/29/2019   PT End of Session - 10/29/19 0855    Visit Number 34    Number of Visits 41    Date for PT Re-Evaluation 10/22/19    Authorization Type [employer] Medicare    Authorization Time Period 05/28/19-07/23/19    PT Start Time 0845    PT Stop Time 0950    PT Time Calculation (min) 65 min    Equipment Utilized During Treatment Gait belt    Activity Tolerance Patient tolerated treatment well;No increased pain;Patient limited by fatigue    Behavior During Therapy [facility] for tasks assessed/performed           Past Medical History:  Diagnosis Date  . Anemia   . Anxiety   . CHF (congestive heart failure) ([place])   . Chronic kidney disease    esrd dialysis m/w/f  . Gout   . Hyperlipidemia   . Hypertension   . Myocardial infarction ([place]) 2010   10 years ago  . 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  . Peripheral vascular disease [facility])     Past Surgical History:  Procedure Laterality Date  . A/V FISTULAGRAM Right 09/06/2018   Procedure: A/V FISTULAGRAM;  Surgeon: Algernon Huxley, MD;  Location: [place] CV LAB;  Service: Cardiovascular;  Laterality: Right;  . A/V SHUNTOGRAM Left 06/21/2017   Procedure: A/V SHUNTOGRAM;  Surgeon: Katha Cabal, MD;  Location: [place] CV LAB;  Service: Cardiovascular;  Laterality: Left;  . A/V SHUNTOGRAM N/A 10/24/2018   Procedure: A/V SHUNTOGRAM;  Surgeon: Algernon Huxley, MD;  Location: [place] CV LAB;  Service: Cardiovascular;  Laterality: N/A;  . ABOVE KNEE LEG AMPUTATION Right 2020  . AMPUTATION Right 10/25/2018   Procedure: AMPUTATION ABOVE KNEE;  Surgeon: Algernon Huxley, MD;   Location: [facility] ORS;  Service: General;  Laterality: Right;  . APPLICATION OF WOUND VAC Right 04/11/2018   Procedure: APPLICATION OF WOUND VAC;  Surgeon: Algernon Huxley, MD;  Location: [facility] ORS;  Service: Vascular;  Laterality: Right;  . AV FISTULA PLACEMENT Left 09/18/2015   Procedure: INSERTION OF ARTERIOVENOUS (AV) GORE-TEX GRAFT ARM ( BRACH/AXILLARY GRAFT W/ INSTANT STICK GRAFT );  Surgeon: Katha Cabal, MD;  Location: [facility] ORS;  Service: Vascular;  Laterality: Left;  . AV FISTULA PLACEMENT Right 07/19/2018   Procedure: INSERTION OF ARTERIOVENOUS (AV) GORE-TEX GRAFT ARM ( BRACHIAL AXILLARY);  Surgeon: Algernon Huxley, MD;  Location: [facility] ORS;  Service: Vascular;  Laterality: Right;  . DIALYSIS FISTULA CREATION Right 10/2017   right chest perm cath  . DIALYSIS/PERMA CATHETER REMOVAL N/A 09/13/2018   Procedure: DIALYSIS/PERMA CATHETER REMOVAL;  Surgeon: Algernon Huxley, MD;  Location: [place] CV LAB;  Service: Cardiovascular;  Laterality: N/A;  . ESOPHAGOGASTRODUODENOSCOPY N/A 12/19/2017   Procedure: ESOPHAGOGASTRODUODENOSCOPY (EGD);  Surgeon: Lin Landsman, MD;  Location: [facility] ENDOSCOPY;  Service: Gastroenterology;  Laterality: N/A;  . LOWER EXTREMITY ANGIOGRAPHY Left 11/16/2017   Procedure: LOWER EXTREMITY ANGIOGRAPHY;  Surgeon: Algernon Huxley, MD;  Location: [place] [facility] LAB;  Service: Cardiovascular;  Laterality: Left;  . LOWER EXTREMITY ANGIOGRAPHY Right 01/18/2018   Procedure: LOWER EXTREMITY ANGIOGRAPHY;  Surgeon: Algernon Huxley, MD;  Location: [place] [facility] LAB;  Service: Cardiovascular;  Laterality: Right;  . LOWER EXTREMITY ANGIOGRAPHY Left 04/02/2018   Procedure: LOWER EXTREMITY ANGIOGRAPHY;  Surgeon: Algernon Huxley, MD;  Location: [place] CV LAB;  Service: Cardiovascular;  Laterality: Left;  . LOWER EXTREMITY ANGIOGRAPHY Right 04/09/2018   Procedure: Lower Extremity Angiography with possible intervention;  Surgeon: Algernon Huxley, MD;  Location: [place] [facility] LAB;  Service:  Cardiovascular;  Laterality: Right;  . LOWER EXTREMITY ANGIOGRAPHY Right 07/23/2018   Procedure: Lower Extremity Angiography;  Surgeon: Algernon Huxley, MD;  Location: [place] [facility] LAB;  Service: Cardiovascular;  Laterality: Right;  . LOWER EXTREMITY ANGIOGRAPHY Right 09/13/2018   Procedure: LOWER EXTREMITY ANGIOGRAPHY;  Surgeon: Algernon Huxley, MD;  Location: [place] [facility] LAB;  Service: Cardiovascular;  Laterality: Right;  . LOWER EXTREMITY VENOGRAPHY Right 09/13/2018   Procedure: LOWER EXTREMITY VENOGRAPHY;  Surgeon: Algernon Huxley, MD;  Location: [place] [facility] LAB;  Service: Cardiovascular;  Laterality: Right;  . PERIPHERAL VASCULAR CATHETERIZATION Left 09/01/2015   Procedure: A/V Shuntogram/Fistulagram;  Surgeon: Katha Cabal, MD;  Location: [place] [facility] LAB;  Service: Cardiovascular;  Laterality: Left;  . PERIPHERAL VASCULAR CATHETERIZATION N/A 09/30/2015   Procedure: A/V Shuntogram/Fistulagram with perm cathether removal;  Surgeon: Algernon Huxley, MD;  Location: [place] [facility] LAB;  Service: Cardiovascular;  Laterality: N/A;  . PERIPHERAL VASCULAR CATHETERIZATION Left 09/30/2015   Procedure: A/V Shunt Intervention;  Surgeon: Algernon Huxley, MD;  Location: [place] CV LAB;  Service: Cardiovascular;  Laterality: Left;  . PERIPHERAL VASCULAR CATHETERIZATION Left 12/03/2015   Procedure: Thrombectomy;  Surgeon: Algernon Huxley, MD;  Location: [place] [facility] LAB;  Service: Cardiovascular;  Laterality: Left;  . PERIPHERAL VASCULAR CATHETERIZATION Left 01/28/2016   Procedure: Thrombectomy;  Surgeon: Algernon Huxley, MD;  Location: [place] CV LAB;  Service: Cardiovascular;  Laterality: Left;  . PERIPHERAL VASCULAR CATHETERIZATION N/A 01/28/2016   Procedure: A/V Shuntogram/Fistulagram;  Surgeon: Algernon Huxley, MD;  Location: [place] CV LAB;  Service: Cardiovascular;  Laterality: N/A;  . SKIN SPLIT GRAFT Right 05/24/2018   Procedure: SKIN GRAFT SPLIT THICKNESS ( RIGHT CALF);  Surgeon: Algernon Huxley, MD;  Location: [facility] ORS;  Service: Vascular;  Laterality: Right;  . UPPER EXTREMITY ANGIOGRAPHY  10/24/2018   Procedure: Upper Extremity Angiography;  Surgeon: Algernon Huxley, MD;  Location: [place] [facility] LAB;  Service: Cardiovascular;;  . UPPER EXTREMITY ANGIOGRAPHY Right 02/14/2019   Procedure: UPPER EXTREMITY ANGIOGRAPHY;  Surgeon: Algernon Huxley, MD;  Location: [place] [facility] LAB;  Service: Cardiovascular;  Laterality: Right;  . WOUND DEBRIDEMENT Right 04/11/2018   Procedure: DEBRIDEMENT WOUND calf muscle and skin;  Surgeon: Algernon Huxley, MD;  Location: [facility] ORS;  Service: Vascular;  Laterality: Right;    There were no vitals filed for this visit.   Subjective Assessment - 10/29/19 0853    Subjective Pt reports he is going back to prosthetist today to has more of his socket trimmer up. As his residual limb reduces in edema, he is able to fit deeper into the socket which is causing it to dig into his groin area.    Pertinent History Pt underwent Rt AKA amputation in June 2020 after difficulty with wound healing/infection. Pt DC hospital to SNF for rehab. He has since been back at home. Pt uses a manual WC or power chair for daily mobility. Pt takes tranportation services to HD MWF. Pt was recently fitted for his AKA prosthesis  in Jan 2021.    Limitations Standing;Walking;House hold activities    How long can you sit comfortably? No difficulty    How long can you stand comfortably? 1 minute    How long can you walk comfortably? unable    Currently in Pain? No/denies              [facility] PT Assessment - 10/29/19 0001      Assessment   Medical Diagnosis s/p Right AKA amputation    Referring Provider (PT) Eulogio Ditch, NP     Onset Date/Surgical Date 10/25/18    Prior Therapy Was at [facility] after AKA      Observation/Other Assessments   Focus on Therapeutic Outcomes (FOTO)  36/100 on 4/22    48/100 at evaluation     Transfers   Transfers Sit to Stand    Sit to Stand 3: Mod assist     Sit to Stand Details Manual facilitation for weight shifting;Manual facilitation for weight bearing    Five time sit to stand comments  22 sec   chair + airex pad; use of hands ad lib with RW in front   Comments Poor power contribution from RLEduring transfers to standing      Ambulation/Gait   Ambulation Distance (Feet) 80 Feet   2x61f   Assistive device Rolling walker;Prosthesis    Gait Pattern Step-to pattern;Lateral hip instability;Trunk flexed;Wide base of support;Abducted- right;Poor foot clearance - right   cannot lock knee with isolated hip movement (trunk strategy    Gait velocity 4.76cm/sec   10MWT trial x2          INTERVENTION THIS DATE: *prosthesis donned throughout entire session -Nu-step cardiovascular conditioning and reciprocal gait training 6 minutes, level 2  -Overground gait training: 2x418f RW, prosthesis; pt self selects to 3-point gait pattern with prosthetic knee locking phase occupying >25% of temporal component of gait cycle. Pt remains flexed at hips. Pt does not need cues for sequencing or RW use, but is given intermittent reminders to remain upright as flexed posture interferes with swing clearance on RLE. Pt's socket is donned in an externally rotated posture on the residual limb (~20 degrees), likely secondary to weight bearing valgus posturing of right hip in a flexed and adducted passive position of stability. Pt given cues for correction of RLE abducted gait and avoidance of passive internal rotation of the RLE during the gait cycle, but he is unable to correct, suspect adductor magnus dominance of hip extension which in combination of glute max insufficiency leads to increased internal rotation, adduction (valgus) when in mid stance to terminal stance.  -Supine socket fit assessment; rotation of socket is adjusted in supine, educated patient on alignment of velcro strap and toes to 12 oclock position or zero degrees in sagittal plane. Pt still has lots of medial  space between residual limb and inside of socket surface. When socket is corrected, areas of pinching at groin and perineum are corrected and improved. Difficult to determine pilon placement, but appears to be set in slight extension and abduction to the socket which would create limitations in neutral standing posture.  -Supine straight knee RLE extension against YTB 30 degrees to zero degrees 1x20 (cues for correction of abducted limb, unable to demonstrate) -Supine neutral hip RLE external rotation c YTB resistance 1x20      PT Short Term Goals - 10/29/19 1000      PT SHORT TERM GOAL #1   Title Pt to demonstrate ability to perfrom STS  transfer from chair c RW minGuard assist and lock his RLE knee joint without assist.    Baseline ModA from elevated surface at eval; 08/29/19: minA+1 with BUE support from regular height chair with arm rests; still requires elevated surface to perform.    Time 4    Period Weeks    Status Partially Met    Target Date 06/25/19      PT SHORT TERM GOAL #2   Title Pt to demonstrate 5 degrees hip flexion 10 degrees hip abdct ROM.    Baseline lacks 5 degrees from neutral in both at eval; At visit 10, has 0 degrees flexion/extension; still lacking 5 degrees ABDCT neutral; 08/29/19: R hip: lacking 5 degrees of extension, 15 degrees abduction    Time 4    Period Weeks    Status Achieved    Target Date 06/25/19             PT Long Term Goals - 10/29/19 1001      PT LONG TERM GOAL #1   Title Pt to demonstrates 15 degrees Rt hip ABDCT, 10 degrees Rt hip extension P/ROM to facilitate prosthesis motor control.    Baseline 08/29/19: R hip: lacking 5 degrees of extension, 15 degrees abduction, 10/15/19= R hip lacking 10 deg extension    Time 8    Period Weeks    Status On-going    Target Date 01/21/20      PT LONG TERM GOAL #2   Title Pt to demonstrate 5/5 strength hip extension at 0 degrees flexion/extension    Baseline has 5/5 in some ranges, but not in ranges  specific to standing or gait.; 08/29/19: Unable to get to neutral flexion extension but 5/5 R hip extension in other ranges, 10/15/19= -3/5 right hip extension    Time 8    Period Weeks    Status On-going    Target Date 01/21/20      PT LONG TERM GOAL #3   Title Pt able to perfrom 5x STS from chair with RW, arms ad lib in <21sec    Baseline On 6/22 c RW and chair + 1 airex pad: 22.95sec (largely avoids use of RLE for power)    Time 12    Period Weeks    Status On-going    Target Date 01/21/20      PT LONG TERM GOAL #4   Title Pt will be able to ambulate at least 100' with RW and supervision with proper locking of R prosthesis in order to demonstrate safe household ambulation in order to improve independence with gait without need for power wheelchair    Baseline Tolerating distance, but speed is inapropriate for distance for energy conservation and safety. Making progress, but needs more improvement.    Time 12    Period Weeks    Status On-going    Target Date 01/21/20                 Plan - 10/29/19 0910    Clinical Impression Statement Recertification this date. Pt continues to make great strides toward goals, now AMB househodl distances with modI to supervision. Pt continues to struggle with full upright posture and motor control in when outside of sagittal straight plan mobility. Pt still unable to produce Rt prosthesis knee lock as one continuous movement during transition from initital contact to mid stance. Pt also unable ot lock knee with dedicated hip extension, requires isometric hip extension with trunk flexion moment for locking. Pt remains  motivated to continue to work toward indpednence with gait adn transfers andtolerance to longer distance AMB. Gait speed remain inappropriate to more than room to room distances at this time simply from an energy conservation standpoint. Pt will conitnue to benefit from skilled PT intervention to [facility] independence in function and  strength for ADL performance and basic mobility.    Personal Factors and Comorbidities Age;Fitness;Past/Current Experience;Education;Transportation    Examination-Activity Limitations Transfers;Dressing;Squat;Stairs;Stand;Bed Mobility    Examination-Participation Restrictions Meal Prep;Cleaning;Community Activity;Driving;Yard Work;Laundry;Shop    Stability/Clinical Decision Making Stable/Uncomplicated    Clinical Decision Making Low    Rehab Potential Good    PT Frequency 2x / week    PT Duration 12 weeks    PT Treatment/Interventions ADLs/Self Care Home Management;Cryotherapy;Electrical Stimulation;DME Instruction;Gait training;Stair training;Functional mobility training;Therapeutic activities;Therapeutic exercise;Balance training;Neuromuscular re-education;Cognitive remediation;Patient/family education;Prosthetic Training;Wheelchair mobility training;Passive range of motion;Dry needling;Scar mobilization;Spinal Manipulations;Joint Manipulations    PT Next Visit Plan Targeted Rt hip extension strengthening in both end range flexion and end range extension ranges, hip rotation strengthening, standing balance adn postural control. Consider starting single leg legpress bilat   PT Home Exercise Plan Medbridge Access Code: [email or web address] (prone positioning), L single leg bridges, L sidelying R hip abduction, hip isometric adduction    Consulted and Agree with Plan of Care Patient           Patient will benefit from skilled therapeutic intervention in order to improve the following deficits and impairments:  Decreased activity tolerance, Decreased balance, Decreased mobility, Decreased strength, Decreased cognition, Decreased knowledge of use of DME, Decreased knowledge of precautions, Decreased endurance, Decreased range of motion, Difficulty walking, Decreased safety awareness, Decreased skin integrity, Decreased scar mobility, Hypomobility, Postural dysfunction, Increased edema, Increased muscle spasms,  Increased fascial restricitons, Prosthetic Dependency, Impaired tone, Improper body mechanics  Visit Diagnosis: Unsteadiness on feet  Difficulty in walking, not elsewhere classified     Problem List Patient Active Problem List   Diagnosis Date Noted  . Cardiomyopathy ([place]) 01/22/2019  . CHF (congestive heart failure) ([place]) 01/22/2019  . Coronary disease 01/22/2019  . Above knee amputation of right lower extremity ([place]) 11/13/2018  . Leg ulcer, right, with fat layer exposed ([place]) 10/22/2018  . Cellulitis of right leg 07/21/2018  . Lower limb ulcer, calf, right, limited to breakdown of skin ([place]) 06/08/2018  . PVD (peripheral vascular disease) ([place]) 04/17/2018  . Malnutrition of moderate degree 04/11/2018  . Pressure injury of skin 04/06/2018  . Altered mental status 04/04/2018  . Hypothermia 04/04/2018  . Hemodialysis graft malfunction ([place]) 03/26/2018  . Hypercholesterolemia 02/27/2018  . Diabetes ([place]) 02/27/2018  . Weakness of right lower extremity 01/20/2018  . Fever   . Periumbilical abdominal pain   . Confusion 12/22/2017  . Acute delirium 12/21/2017  . Protein-calorie malnutrition, severe 12/19/2017  . Intractable nausea and vomiting 12/18/2017  . Lymphedema 12/13/2017  . Cellulitis 11/27/2017  . Chest pain 11/19/2017  . Atherosclerosis of native arteries of the extremities with ulceration ([place]) 11/07/2017  . Twitching 01/03/2017  . Elevated troponin 10/02/2015  . Complications, dialysis, catheter, mechanical ([place]) 10/02/2015  . Musculoskeletal chest pain 09/28/2015  . Chronic diastolic CHF (congestive heart failure) ([place]) 09/28/2015  . End stage renal disease ([place]) 10/09/2012  . Hypertension 10/09/2012  . Gout 10/09/2012   12:11 PM, 10/29/19 Etta Grandchild, PT, DPT Physical Therapist - [place] [facility] 10/29/2019, 11:44 AM  [place]  MAIN [facility] SERVICES [place], [place], [address] Phone: [phone]   Fax:  [phone]  Name: Johnny [name]. MRN: [id number] Date of Birth: 1950/08/20

## 2019-10-31 ENCOUNTER — Ambulatory Visit: Payer: Medicare Other | Admitting: Physical Therapy

## 2019-10-31 ENCOUNTER — Encounter: Payer: Self-pay | Admitting: Physical Therapy

## 2019-10-31 DIAGNOSIS — R2681 Unsteadiness on feet: Secondary | ICD-10-CM

## 2019-10-31 DIAGNOSIS — R262 Difficulty in walking, not elsewhere classified: Secondary | ICD-10-CM

## 2019-10-31 NOTE — Therapy (Signed)
[place] MAIN [facility] SERVICES [address] [place], [place], [address] Phone: [phone]   Fax:  [phone]  Physical Therapy Treatment  Patient Details  Name: Johnny [name]. MRN: [id number] Date of Birth: [birth year] Referring Provider (PT): Eulogio Ditch, NP    Encounter Date: 10/31/2019   PT End of Session - 10/31/19 1201    Visit Number 35    Number of Visits 41    Date for PT Re-Evaluation 10/22/19    Authorization Type [employer] Medicare    Authorization Time Period 05/28/19-07/23/19    PT Start Time 0850    PT Stop Time 0930    PT Time Calculation (min) 40 min    Equipment Utilized During Treatment Gait belt    Activity Tolerance Patient tolerated treatment well;No increased pain;Patient limited by fatigue    Behavior During Therapy [facility] for tasks assessed/performed           Past Medical History:  Diagnosis Date  . Anemia   . Anxiety   . CHF (congestive heart failure) ([place])   . Chronic kidney disease    esrd dialysis m/w/f  . Gout   . Hyperlipidemia   . Hypertension   . Myocardial infarction ([place]) 2010   10 years ago  . 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  . Peripheral vascular disease [facility])     Past Surgical History:  Procedure Laterality Date  . A/V FISTULAGRAM Right 09/06/2018   Procedure: A/V FISTULAGRAM;  Surgeon: Algernon Huxley, MD;  Location: [place] CV LAB;  Service: Cardiovascular;  Laterality: Right;  . A/V SHUNTOGRAM Left 06/21/2017   Procedure: A/V SHUNTOGRAM;  Surgeon: Katha Cabal, MD;  Location: [place] CV LAB;  Service: Cardiovascular;  Laterality: Left;  . A/V SHUNTOGRAM N/A 10/24/2018   Procedure: A/V SHUNTOGRAM;  Surgeon: Algernon Huxley, MD;  Location: [place] CV LAB;  Service: Cardiovascular;  Laterality: N/A;  . ABOVE KNEE LEG AMPUTATION Right 2020  . AMPUTATION Right 10/25/2018   Procedure: AMPUTATION ABOVE KNEE;  Surgeon: Algernon Huxley, MD;   Location: [facility] ORS;  Service: General;  Laterality: Right;  . APPLICATION OF WOUND VAC Right 04/11/2018   Procedure: APPLICATION OF WOUND VAC;  Surgeon: Algernon Huxley, MD;  Location: [facility] ORS;  Service: Vascular;  Laterality: Right;  . AV FISTULA PLACEMENT Left 09/18/2015   Procedure: INSERTION OF ARTERIOVENOUS (AV) GORE-TEX GRAFT ARM ( BRACH/AXILLARY GRAFT W/ INSTANT STICK GRAFT );  Surgeon: Katha Cabal, MD;  Location: [facility] ORS;  Service: Vascular;  Laterality: Left;  . AV FISTULA PLACEMENT Right 07/19/2018   Procedure: INSERTION OF ARTERIOVENOUS (AV) GORE-TEX GRAFT ARM ( BRACHIAL AXILLARY);  Surgeon: Algernon Huxley, MD;  Location: [facility] ORS;  Service: Vascular;  Laterality: Right;  . DIALYSIS FISTULA CREATION Right 10/2017   right chest perm cath  . DIALYSIS/PERMA CATHETER REMOVAL N/A 09/13/2018   Procedure: DIALYSIS/PERMA CATHETER REMOVAL;  Surgeon: Algernon Huxley, MD;  Location: [place] CV LAB;  Service: Cardiovascular;  Laterality: N/A;  . ESOPHAGOGASTRODUODENOSCOPY N/A 12/19/2017   Procedure: ESOPHAGOGASTRODUODENOSCOPY (EGD);  Surgeon: Lin Landsman, MD;  Location: [facility] ENDOSCOPY;  Service: Gastroenterology;  Laterality: N/A;  . LOWER EXTREMITY ANGIOGRAPHY Left 11/16/2017   Procedure: LOWER EXTREMITY ANGIOGRAPHY;  Surgeon: Algernon Huxley, MD;  Location: [place] CV LAB;  Service: Cardiovascular;  Laterality: Left;  . LOWER EXTREMITY ANGIOGRAPHY Right 01/18/2018   Procedure: LOWER EXTREMITY ANGIOGRAPHY;  Surgeon: Algernon Huxley, MD;  Location: [place] CV LAB;  Service: Cardiovascular;  Laterality: Right;  . LOWER EXTREMITY ANGIOGRAPHY Left 04/02/2018   Procedure: LOWER EXTREMITY ANGIOGRAPHY;  Surgeon: Algernon Huxley, MD;  Location: [place] CV LAB;  Service: Cardiovascular;  Laterality: Left;  . LOWER EXTREMITY ANGIOGRAPHY Right 04/09/2018   Procedure: Lower Extremity Angiography with possible intervention;  Surgeon: Algernon Huxley, MD;  Location: [place] CV LAB;  Service:  Cardiovascular;  Laterality: Right;  . LOWER EXTREMITY ANGIOGRAPHY Right 07/23/2018   Procedure: Lower Extremity Angiography;  Surgeon: Algernon Huxley, MD;  Location: [place] CV LAB;  Service: Cardiovascular;  Laterality: Right;  . LOWER EXTREMITY ANGIOGRAPHY Right 09/13/2018   Procedure: LOWER EXTREMITY ANGIOGRAPHY;  Surgeon: Algernon Huxley, MD;  Location: [place] CV LAB;  Service: Cardiovascular;  Laterality: Right;  . LOWER EXTREMITY VENOGRAPHY Right 09/13/2018   Procedure: LOWER EXTREMITY VENOGRAPHY;  Surgeon: Algernon Huxley, MD;  Location: [place] CV LAB;  Service: Cardiovascular;  Laterality: Right;  . PERIPHERAL VASCULAR CATHETERIZATION Left 09/01/2015   Procedure: A/V Shuntogram/Fistulagram;  Surgeon: Katha Cabal, MD;  Location: [place] CV LAB;  Service: Cardiovascular;  Laterality: Left;  . PERIPHERAL VASCULAR CATHETERIZATION N/A 09/30/2015   Procedure: A/V Shuntogram/Fistulagram with perm cathether removal;  Surgeon: Algernon Huxley, MD;  Location: [place] CV LAB;  Service: Cardiovascular;  Laterality: N/A;  . PERIPHERAL VASCULAR CATHETERIZATION Left 09/30/2015   Procedure: A/V Shunt Intervention;  Surgeon: Algernon Huxley, MD;  Location: [place] CV LAB;  Service: Cardiovascular;  Laterality: Left;  . PERIPHERAL VASCULAR CATHETERIZATION Left 12/03/2015   Procedure: Thrombectomy;  Surgeon: Algernon Huxley, MD;  Location: [place] CV LAB;  Service: Cardiovascular;  Laterality: Left;  . PERIPHERAL VASCULAR CATHETERIZATION Left 01/28/2016   Procedure: Thrombectomy;  Surgeon: Algernon Huxley, MD;  Location: [place] CV LAB;  Service: Cardiovascular;  Laterality: Left;  . PERIPHERAL VASCULAR CATHETERIZATION N/A 01/28/2016   Procedure: A/V Shuntogram/Fistulagram;  Surgeon: Algernon Huxley, MD;  Location: [place] CV LAB;  Service: Cardiovascular;  Laterality: N/A;  . SKIN SPLIT GRAFT Right 05/24/2018   Procedure: SKIN GRAFT SPLIT THICKNESS ( RIGHT CALF);  Surgeon: Algernon Huxley, MD;  Location: [facility] ORS;  Service: Vascular;  Laterality: Right;  . UPPER EXTREMITY ANGIOGRAPHY  10/24/2018   Procedure: Upper Extremity Angiography;  Surgeon: Algernon Huxley, MD;  Location: [place] CV LAB;  Service: Cardiovascular;;  . UPPER EXTREMITY ANGIOGRAPHY Right 02/14/2019   Procedure: UPPER EXTREMITY ANGIOGRAPHY;  Surgeon: Algernon Huxley, MD;  Location: [place] CV LAB;  Service: Cardiovascular;  Laterality: Right;  . WOUND DEBRIDEMENT Right 04/11/2018   Procedure: DEBRIDEMENT WOUND calf muscle and skin;  Surgeon: Algernon Huxley, MD;  Location: [facility] ORS;  Service: Vascular;  Laterality: Right;    There were no vitals filed for this visit.   Subjective Assessment - 10/31/19 1200    Subjective Patient reports that he is feeling good today and he is doing more walking at home.    Pertinent History Pt underwent Rt AKA amputation in June 2020 after difficulty with wound healing/infection. Pt DC hospital to SNF for rehab. He has since been back at home. Pt uses a manual WC or power chair for daily mobility. Pt takes tranportation services to HD MWF. Pt was recently fitted for his AKA prosthesis in Jan 2021.    Limitations Standing;Walking;House hold activities    How long can you sit comfortably? No difficulty    How long can  you stand comfortably? 1 minute    How long can you walk comfortably? unable    Currently in Pain? No/denies    Multiple Pain Sites No              Gait Training Nu-step x 5 mins L 3  Seated LLE hip flex and knee extension with 2 lbs x 20 x 2   Performed gait training with patientusing BRW Minimal education today required with patient regarding gait sequencing including advancing walker,unlocking R knee, utilizing momentum to create heel strike on the right, and to lock out right knee.  He demonstrates improved independence today and has faster gait speed and stepping flow Improved R heel strike noted today and overall speed is improved as  well. Practiced partial step-through gait with patient todayand it is improved from last session. CGAand wheelchair follow.  He continues torequire cues for upright posture He is able to ambulate150'x 2with BRW completinglaps in the hallway He continues to demonstrateheavy BUE support on walker handles during gait. Fatigue monitored by therapist throughout and one seated rest breaks provided between reps.   Patient performed with instruction, verbal cues, tactile cues of therapist: goal:increase tissue extensibility, promote proper posture, improve mobility                         PT Education - 10/31/19 1201    Education Details HEP    Person(s) Educated Patient    Methods Explanation    Comprehension Verbalized understanding;Verbal cues required;Need further instruction            PT Short Term Goals - 10/29/19 1000      PT SHORT TERM GOAL #1   Title Pt to demonstrate ability to perfrom STS transfer from chair c RW minGuard assist and lock his RLE knee joint without assist.    Baseline ModA from elevated surface at eval; 08/29/19: minA+1 with BUE support from regular height chair with arm rests; still requires elevated surface to perform.    Time 4    Period Weeks    Status Partially Met    Target Date 06/25/19      PT SHORT TERM GOAL #2   Title Pt to demonstrate 5 degrees hip flexion 10 degrees hip abdct ROM.    Baseline lacks 5 degrees from neutral in both at eval; At visit 10, has 0 degrees flexion/extension; still lacking 5 degrees ABDCT neutral; 08/29/19: R hip: lacking 5 degrees of extension, 15 degrees abduction    Time 4    Period Weeks    Status Achieved    Target Date 06/25/19             PT Long Term Goals - 10/29/19 1001      PT LONG TERM GOAL #1   Title Pt to demonstrates 15 degrees Rt hip ABDCT, 10 degrees Rt hip extension P/ROM to facilitate prosthesis motor control.    Baseline 08/29/19: R hip: lacking 5 degrees of  extension, 15 degrees abduction, 10/15/19= R hip lacking 10 deg extension    Time 8    Period Weeks    Status On-going    Target Date 01/21/20      PT LONG TERM GOAL #2   Title Pt to demonstrate 5/5 strength hip extension at 0 degrees flexion/extension    Baseline has 5/5 in some ranges, but not in ranges specific to standing or gait.; 08/29/19: Unable to get to neutral flexion extension but 5/5 R hip  extension in other ranges, 10/15/19= -3/5 right hip extension    Time 8    Period Weeks    Status On-going    Target Date 01/21/20      PT LONG TERM GOAL #3   Title Pt able to perfrom 5x STS from chair with RW, arms ad lib in <21sec    Baseline On 6/22 c RW and chair + 1 airex pad: 22.95sec (largely avoids use of RLE for power)    Time 12    Period Weeks    Status On-going    Target Date 01/21/20      PT LONG TERM GOAL #4   Title Pt will be able to ambulate at least 100' with RW and supervision with proper locking of R prosthesis in order to demonstrate safe household ambulation in order to improve independence with gait without need for power wheelchair    Baseline Tolerating distance, but speed is inapropriate for distance for energy conservation and safety. Making progress, but needs more improvement.    Time 12    Period Weeks    Status On-going    Target Date 01/21/20                 Plan - 10/31/19 1202    Clinical Impression Statement Pt was able to perform all gait training with min assist, and safety with turning.and safety with transfers. Pt requires verbal, visual and tactile cues during exercise in order to complete tasks with proper form and technique. Pt would continue to benefit from skilled PT services in order to further strengthen LE's, improve static and dynamic balance, and improve coordination in order to increase functional mobility and decrease risk of falls    Personal Factors and Comorbidities Age;Fitness;Past/Current Experience;Education;Transportation     Examination-Activity Limitations Transfers;Dressing;Squat;Stairs;Stand;Bed Mobility    Examination-Participation Restrictions Meal Prep;Cleaning;Community Activity;Driving;Yard Work;Laundry;Shop    Stability/Clinical Decision Making Stable/Uncomplicated    Rehab Potential Good    PT Frequency 2x / week    PT Duration 12 weeks    PT Treatment/Interventions ADLs/Self Care Home Management;Cryotherapy;Electrical Stimulation;DME Instruction;Gait training;Stair training;Functional mobility training;Therapeutic activities;Therapeutic exercise;Balance training;Neuromuscular re-education;Cognitive remediation;Patient/family education;Prosthetic Training;Wheelchair mobility training;Passive range of motion;Dry needling;Scar mobilization;Spinal Manipulations;Joint Manipulations    PT Next Visit Plan Targeted Rt hip extension strengthening in both end range flexion and end range extension ranges, hip rotation strengthening, standing balance adn postural control.    PT Home Exercise Plan Medbridge Access Code: [email or web address] (prone positioning), L single leg bridges, L sidelying R hip abduction, hip isometric adduction    Consulted and Agree with Plan of Care Patient           Patient will benefit from skilled therapeutic intervention in order to improve the following deficits and impairments:  Decreased activity tolerance, Decreased balance, Decreased mobility, Decreased strength, Decreased cognition, Decreased knowledge of use of DME, Decreased knowledge of precautions, Decreased endurance, Decreased range of motion, Difficulty walking, Decreased safety awareness, Decreased skin integrity, Decreased scar mobility, Hypomobility, Postural dysfunction, Increased edema, Increased muscle spasms, Increased fascial restricitons, Prosthetic Dependency, Impaired tone, Improper body mechanics  Visit Diagnosis: Difficulty in walking, not elsewhere classified  Unsteadiness on feet     Problem List Patient Active  Problem List   Diagnosis Date Noted  . Cardiomyopathy ([place]) 01/22/2019  . CHF (congestive heart failure) ([place]) 01/22/2019  . Coronary disease 01/22/2019  . Above knee amputation of right lower extremity ([place]) 11/13/2018  . Leg ulcer, right, with fat layer exposed ([place]) 10/22/2018  . Cellulitis of  right leg 07/21/2018  . Lower limb ulcer, calf, right, limited to breakdown of skin ([place]) 06/08/2018  . PVD (peripheral vascular disease) ([place]) 04/17/2018  . Malnutrition of moderate degree 04/11/2018  . Pressure injury of skin 04/06/2018  . Altered mental status 04/04/2018  . Hypothermia 04/04/2018  . Hemodialysis graft malfunction ([place]) 03/26/2018  . Hypercholesterolemia 02/27/2018  . Diabetes ([place]) 02/27/2018  . Weakness of right lower extremity 01/20/2018  . Fever   . Periumbilical abdominal pain   . Confusion 12/22/2017  . Acute delirium 12/21/2017  . Protein-calorie malnutrition, severe 12/19/2017  . Intractable nausea and vomiting 12/18/2017  . Lymphedema 12/13/2017  . Cellulitis 11/27/2017  . Chest pain 11/19/2017  . Atherosclerosis of native arteries of the extremities with ulceration ([place]) 11/07/2017  . Twitching 01/03/2017  . Elevated troponin 10/02/2015  . Complications, dialysis, catheter, mechanical ([place]) 10/02/2015  . Musculoskeletal chest pain 09/28/2015  . Chronic diastolic CHF (congestive heart failure) ([place]) 09/28/2015  . End stage renal disease ([place]) 10/09/2012  . Hypertension 10/09/2012  . Gout 10/09/2012    Alanson Puls, [place] DPT 10/31/2019, 12:10 PM  [place] MAIN [facility] SERVICES [address] [place], [place], [address] Phone: [phone]   Fax:  [phone]  Name: Ranier Coach. MRN: [id number] Date of Birth: 20-Jun-1950

## 2019-11-05 ENCOUNTER — Ambulatory Visit: Payer: Medicare Other | Admitting: Physical Therapy

## 2019-11-05 ENCOUNTER — Encounter: Payer: Self-pay | Admitting: Physical Therapy

## 2019-11-05 ENCOUNTER — Other Ambulatory Visit: Payer: Self-pay

## 2019-11-05 DIAGNOSIS — R2681 Unsteadiness on feet: Secondary | ICD-10-CM

## 2019-11-05 DIAGNOSIS — R262 Difficulty in walking, not elsewhere classified: Secondary | ICD-10-CM | POA: Diagnosis not present

## 2019-11-05 NOTE — Therapy (Signed)
[place] MAIN [facility] SERVICES [address] [place], [place], [address] Phone: [phone]   Fax:  [phone]  Physical Therapy Treatment  Patient Details  Name: Johnny [name]. MRN: [id number] Date of Birth: 02/05/1951 Referring Provider (PT): Eulogio Ditch, NP    Encounter Date: 11/05/2019   PT End of Session - 11/05/19 0841    Visit Number 36    Number of Visits 41    Date for PT Re-Evaluation 01/21/20    Authorization Type [employer] Medicare    Authorization Time Period 05/28/19-07/23/19    PT Start Time 0845    PT Stop Time 0925    PT Time Calculation (min) 40 min    Equipment Utilized During Treatment Gait belt    Activity Tolerance Patient tolerated treatment well;No increased pain;Patient limited by fatigue    Behavior During Therapy [facility] for tasks assessed/performed           Past Medical History:  Diagnosis Date  . Anemia   . Anxiety   . CHF (congestive heart failure) ([place])   . Chronic kidney disease    esrd dialysis m/w/f  . Gout   . Hyperlipidemia   . Hypertension   . Myocardial infarction ([place]) 2010   10 years ago  . 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  . Peripheral vascular disease [facility])     Past Surgical History:  Procedure Laterality Date  . A/V FISTULAGRAM Right 09/06/2018   Procedure: A/V FISTULAGRAM;  Surgeon: Algernon Huxley, MD;  Location: [place] CV LAB;  Service: Cardiovascular;  Laterality: Right;  . A/V SHUNTOGRAM Left 06/21/2017   Procedure: A/V SHUNTOGRAM;  Surgeon: Katha Cabal, MD;  Location: [place] [facility] LAB;  Service: Cardiovascular;  Laterality: Left;  . A/V SHUNTOGRAM N/A 10/24/2018   Procedure: A/V SHUNTOGRAM;  Surgeon: Algernon Huxley, MD;  Location: [place] [facility];  Service: Cardiovascular;  Laterality: N/A;  . ABOVE KNEE LEG AMPUTATION Right 2020  . AMPUTATION Right 10/25/2018   Procedure: AMPUTATION ABOVE KNEE;  Surgeon: Algernon Huxley, MD;   Location: [facility] ORS;  Service: General;  Laterality: Right;  . APPLICATION OF WOUND VAC Right 04/11/2018   Procedure: APPLICATION OF WOUND VAC;  Surgeon: Algernon Huxley, MD;  Location: [facility] ORS;  Service: Vascular;  Laterality: Right;  . AV FISTULA PLACEMENT Left 09/18/2015   Procedure: INSERTION OF ARTERIOVENOUS (AV) GORE-TEX GRAFT ARM ( BRACH/AXILLARY GRAFT W/ INSTANT STICK GRAFT );  Surgeon: Katha Cabal, MD;  Location: [facility] OR[facility];  Service: Vascular;  Laterality: Left;  . AV FISTULA PLACEMENT Right 07/19/2018   Procedure: INSERTION OF ARTERIOVENOUS (AV) GORE-TEX GRAFT ARM ( BRACHIAL AXILLARY);  Surgeon: Algernon Huxley, MD;  Location: [facility] ORS;  Service: Vascular;  Laterality: Right;  . DIALYSIS FISTULA CREATION Right 10/2017   right chest perm cath  . DIALYSIS/PERMA CATHETER REMOVAL N/A 09/13/2018   Procedure: DIALYSIS/PERMA CATHETER REMOVAL;  Surgeon: Algernon Huxley, MD;  Location: [place] CV LAB;  Service: Cardiovascular;  Laterality: N/A;  . ESOPHAGOGASTRODUODENOSCOPY N/A 12/19/2017   Procedure: ESOPHAGOGASTRODUODENOSCOPY (EGD);  Surgeon: Lin Landsman, MD;  Location: [facility] ENDOSCOPY;  Service: Gastroenterology;  Laterality: N/A;  . LOWER EXTREMITY ANGIOGRAPHY Left 11/16/2017   Procedure: LOWER EXTREMITY ANGIOGRAPHY;  Surgeon: Algernon Huxley, MD;  Location: [place] [facility] LAB;  Service: Cardiovascular;  Laterality: Left;  . LOWER EXTREMITY ANGIOGRAPHY Right 01/18/2018   Procedure: LOWER EXTREMITY ANGIOGRAPHY;  Surgeon: Algernon Huxley, MD;  Location: [place] [facility];  Service: Cardiovascular;  Laterality: Right;  . LOWER EXTREMITY ANGIOGRAPHY Left 04/02/2018   Procedure: LOWER EXTREMITY ANGIOGRAPHY;  Surgeon: Algernon Huxley, MD;  Location: [place] [facility] LAB;  Service: Cardiovascular;  Laterality: Left;  . LOWER EXTREMITY ANGIOGRAPHY Right 04/09/2018   Procedure: Lower Extremity Angiography with possible intervention;  Surgeon: Algernon Huxley, MD;  Location: [place] [facility] LAB;  Service:  Cardiovascular;  Laterality: Right;  . LOWER EXTREMITY ANGIOGRAPHY Right 07/23/2018   Procedure: Lower Extremity Angiography;  Surgeon: Algernon Huxley, MD;  Location: [place] [facility];  Service: Cardiovascular;  Laterality: Right;  . LOWER EXTREMITY ANGIOGRAPHY Right 09/13/2018   Procedure: LOWER EXTREMITY ANGIOGRAPHY;  Surgeon: Algernon Huxley, MD;  Location: [place] [facility] LAB;  Service: Cardiovascular;  Laterality: Right;  . LOWER EXTREMITY VENOGRAPHY Right 09/13/2018   Procedure: LOWER EXTREMITY VENOGRAPHY;  Surgeon: Algernon Huxley, MD;  Location: [place] [facility] LAB;  Service: Cardiovascular;  Laterality: Right;  . PERIPHERAL VASCULAR CATHETERIZATION Left 09/01/2015   Procedure: A/V Shuntogram/Fistulagram;  Surgeon: Katha Cabal, MD;  Location: [place] [facility];  Service: Cardiovascular;  Laterality: Left;  . PERIPHERAL VASCULAR CATHETERIZATION N/A 09/30/2015   Procedure: A/V Shuntogram/Fistulagram with perm cathether removal;  Surgeon: Algernon Huxley, MD;  Location: [place] [facility];  Service: Cardiovascular;  Laterality: N/A;  . PERIPHERAL VASCULAR CATHETERIZATION Left 09/30/2015   Procedure: A/V Shunt Intervention;  Surgeon: Algernon Huxley, MD;  Location: [place] [facility] LAB;  Service: Cardiovascular;  Laterality: Left;  . PERIPHERAL VASCULAR CATHETERIZATION Left 12/03/2015   Procedure: Thrombectomy;  Surgeon: Algernon Huxley, MD;  Location: [place] [facility];  Service: Cardiovascular;  Laterality: Left;  . PERIPHERAL VASCULAR CATHETERIZATION Left 01/28/2016   Procedure: Thrombectomy;  Surgeon: Algernon Huxley, MD;  Location: [place] [facility] LAB;  Service: Cardiovascular;  Laterality: Left;  . PERIPHERAL VASCULAR CATHETERIZATION N/A 01/28/2016   Procedure: A/V Shuntogram/Fistulagram;  Surgeon: Algernon Huxley, MD;  Location: [place] [facility];  Service: Cardiovascular;  Laterality: N/A;  . SKIN SPLIT GRAFT Right 05/24/2018   Procedure: SKIN GRAFT SPLIT THICKNESS ( RIGHT CALF);  Surgeon: Algernon Huxley, MD;  Location: [facility] OR[facility];  Service: Vascular;  Laterality: Right;  . UPPER EXTREMITY ANGIOGRAPHY  10/24/2018   Procedure: Upper Extremity Angiography;  Surgeon: Algernon Huxley, MD;  Location: [place] [facility] LAB;  Service: Cardiovascular;;  . UPPER EXTREMITY ANGIOGRAPHY Right 02/14/2019   Procedure: UPPER EXTREMITY ANGIOGRAPHY;  Surgeon: Algernon Huxley, MD;  Location: [place] [facility];  Service: Cardiovascular;  Laterality: Right;  . WOUND DEBRIDEMENT Right 04/11/2018   Procedure: DEBRIDEMENT WOUND calf muscle and skin;  Surgeon: Algernon Huxley, MD;  Location: [facility] OR[facility];  Service: Vascular;  Laterality: Right;    There were no vitals filed for this visit.   Subjective Assessment - 11/05/19 0840    Subjective Patient reports that he is feeling good today and he is doing more walking at home.    Pertinent History Pt underwent Rt AKA amputation in June 2020 after difficulty with wound healing/infection. Pt DC hospital to SNF for rehab. He has since been back at home. Pt uses a manual WC or power chair for daily mobility. Pt takes tranportation services to HD MWF. Pt was recently fitted for his AKA prosthesis in Jan 2021.    Limitations Standing;Walking;House hold activities    How long can you sit comfortably? No difficulty    How long can  you stand comfortably? 1 minute    How long can you walk comfortably? unable    Currently in Pain? No/denies    Multiple Pain Sites No               Gait Training Nu-step x 5 mins L 3  Seated LLE hip flex and knee extension with 2 lbs x 20 x 2   Performed gait training with patientusing BRW Minimal education today required with patient regarding gait sequencing including advancing walker,unlocking R knee, utilizing momentum to create heel strike on the right, and to lock out right knee. He demonstrates improved independence today and has faster gait speed and stepping flow Improved R heel strike noted today and overall speed is improved as  well. Practiced partial step-through gait with patient todayand it is improved from last session. CGAand wheelchair follow.  He continues torequire cues for upright posture He is able to ambulate150'x 2with BRWcompletinglaps in the hallway He continues to demonstrateheavy BUE support on walker handles during gait. Fatigue monitored by therapist throughout and one seated rest breaks provided between reps.  Patient performed with instruction, verbal cues, tactile cues of therapist: goal:increase tissue extensibility, promote proper posture, improve mobility                        PT Education - 11/05/19 0840    Education Details HEP    Person(s) Educated Patient    Methods Explanation    Comprehension Verbalized understanding;Returned demonstration;Tactile cues required            PT Short Term Goals - 10/29/19 1000      PT SHORT TERM GOAL #1   Title Pt to demonstrate ability to perfrom STS transfer from chair c RW minGuard assist and lock his RLE knee joint without assist.    Baseline ModA from elevated surface at eval; 08/29/19: minA+1 with BUE support from regular height chair with arm rests; still requires elevated surface to perform.    Time 4    Period Weeks    Status Partially Met    Target Date 06/25/19      PT SHORT TERM GOAL #2   Title Pt to demonstrate 5 degrees hip flexion 10 degrees hip abdct ROM.    Baseline lacks 5 degrees from neutral in both at eval; At visit 10, has 0 degrees flexion/extension; still lacking 5 degrees ABDCT neutral; 08/29/19: R hip: lacking 5 degrees of extension, 15 degrees abduction    Time 4    Period Weeks    Status Achieved    Target Date 06/25/19             PT Long Term Goals - 10/29/19 1001      PT LONG TERM GOAL #1   Title Pt to demonstrates 15 degrees Rt hip ABDCT, 10 degrees Rt hip extension P/ROM to facilitate prosthesis motor control.    Baseline 08/29/19: R hip: lacking 5 degrees of  extension, 15 degrees abduction, 10/15/19= R hip lacking 10 deg extension    Time 8    Period Weeks    Status On-going    Target Date 01/21/20      PT LONG TERM GOAL #2   Title Pt to demonstrate 5/5 strength hip extension at 0 degrees flexion/extension    Baseline has 5/5 in some ranges, but not in ranges specific to standing or gait.; 08/29/19: Unable to get to neutral flexion extension but 5/5 R hip extension in other ranges,  10/15/19= -3/5 right hip extension    Time 8    Period Weeks    Status On-going    Target Date 01/21/20      PT LONG TERM GOAL #3   Title Pt able to perfrom 5x STS from chair with RW, arms ad lib in <21sec    Baseline On 6/22 c RW and chair + 1 airex pad: 22.95sec (largely avoids use of RLE for power)    Time 12    Period Weeks    Status On-going    Target Date 01/21/20      PT LONG TERM GOAL #4   Title Pt will be able to ambulate at least 100' with RW and supervision with proper locking of R prosthesis in order to demonstrate safe household ambulation in order to improve independence with gait without need for power wheelchair    Baseline Tolerating distance, but speed is inapropriate for distance for energy conservation and safety. Making progress, but needs more improvement.    Time 12    Period Weeks    Status On-going    Target Date 01/21/20                 Plan - 11/05/19 0841    Clinical Impression Statement Patient performs gait training with BRW and R prosthesis with min cues for posture and sequencing. He is instructed in transfers from various surface heights with min assist and cues for safety. Patient will continue to benefit from skilled PT to improve mobility and safety.    Personal Factors and Comorbidities Age;Fitness;Past/Current Experience;Education;Transportation    Examination-Activity Limitations Transfers;Dressing;Squat;Stairs;Stand;Bed Mobility    Examination-Participation Restrictions Meal Prep;Cleaning;Community  Activity;Driving;Yard Work;Laundry;Shop    Stability/Clinical Decision Making Stable/Uncomplicated    Rehab Potential Good    PT Frequency 2x / week    PT Duration 12 weeks    PT Treatment/Interventions ADLs/Self Care Home Management;Cryotherapy;Electrical Stimulation;DME Instruction;Gait training;Stair training;Functional mobility training;Therapeutic activities;Therapeutic exercise;Balance training;Neuromuscular re-education;Cognitive remediation;Patient/family education;Prosthetic Training;Wheelchair mobility training;Passive range of motion;Dry needling;Scar mobilization;Spinal Manipulations;Joint Manipulations    PT Next Visit Plan Targeted Rt hip extension strengthening in both end range flexion and end range extension ranges, hip rotation strengthening, standing balance adn postural control.    PT Home Exercise Plan Medbridge Access Code: [email or web address] (prone positioning), L single leg bridges, L sidelying R hip abduction, hip isometric adduction    Consulted and Agree with Plan of Care Patient           Patient will benefit from skilled therapeutic intervention in order to improve the following deficits and impairments:  Decreased activity tolerance, Decreased balance, Decreased mobility, Decreased strength, Decreased cognition, Decreased knowledge of use of DME, Decreased knowledge of precautions, Decreased endurance, Decreased range of motion, Difficulty walking, Decreased safety awareness, Decreased skin integrity, Decreased scar mobility, Hypomobility, Postural dysfunction, Increased edema, Increased muscle spasms, Increased fascial restricitons, Prosthetic Dependency, Impaired tone, Improper body mechanics  Visit Diagnosis: Difficulty in walking, not elsewhere classified  Unsteadiness on feet     Problem List Patient Active Problem List   Diagnosis Date Noted  . Cardiomyopathy ([place]) 01/22/2019  . CHF (congestive heart failure) ([place]) 01/22/2019  . Coronary disease 01/22/2019  .  Above knee amputation of right lower extremity ([place]) 11/13/2018  . Leg ulcer, right, with fat layer exposed ([place]) 10/22/2018  . Cellulitis of right leg 07/21/2018  . Lower limb ulcer, calf, right, limited to breakdown of skin ([place]) 06/08/2018  . PVD (peripheral vascular disease) ([place]) 04/17/2018  . Malnutrition of moderate  degree 04/11/2018  . Pressure injury of skin 04/06/2018  . Altered mental status 04/04/2018  . Hypothermia 04/04/2018  . Hemodialysis graft malfunction ([place]) 03/26/2018  . Hypercholesterolemia 02/27/2018  . Diabetes ([place]) 02/27/2018  . Weakness of right lower extremity 01/20/2018  . Fever   . Periumbilical abdominal pain   . Confusion 12/22/2017  . Acute delirium 12/21/2017  . Protein-calorie malnutrition, severe 12/19/2017  . Intractable nausea and vomiting 12/18/2017  . Lymphedema 12/13/2017  . Cellulitis 11/27/2017  . Chest pain 11/19/2017  . Atherosclerosis of native arteries of the extremities with ulceration ([place]) 11/07/2017  . Twitching 01/03/2017  . Elevated troponin 10/02/2015  . Complications, dialysis, catheter, mechanical ([place]) 10/02/2015  . Musculoskeletal chest pain 09/28/2015  . Chronic diastolic CHF (congestive heart failure) ([place]) 09/28/2015  . End stage renal disease ([place]) 10/09/2012  . Hypertension 10/09/2012  . Gout 10/09/2012    Alanson Puls, PT DPT 11/05/2019, 9:37 AM  [place] MAIN [facility] SERVICES [address] [place], [place], [address] Phone: [phone]   Fax:  [phone]  Name: Johnny [name]. MRN: [id number] Date of Birth: 20-Feb-1951

## 2019-11-07 ENCOUNTER — Ambulatory Visit: Payer: Medicare Other | Attending: Nurse Practitioner | Admitting: Physical Therapy

## 2019-11-07 ENCOUNTER — Encounter: Payer: Self-pay | Admitting: Physical Therapy

## 2019-11-07 ENCOUNTER — Other Ambulatory Visit: Payer: Self-pay

## 2019-11-07 DIAGNOSIS — R2681 Unsteadiness on feet: Secondary | ICD-10-CM | POA: Diagnosis present

## 2019-11-07 DIAGNOSIS — R262 Difficulty in walking, not elsewhere classified: Secondary | ICD-10-CM

## 2019-11-07 NOTE — Therapy (Signed)
[place] MAIN [facility] SERVICES [address] [place], [place], [address] Phone: [phone]   Fax:  [phone]  Physical Therapy Treatment  Patient Details  Name: Johnny [name]. MRN: [id number] Date of Birth: [birth year] Referring Provider (PT): Eulogio Ditch, NP    Encounter Date: 11/07/2019   PT End of Session - 11/07/19 0919    Visit Number 37    Number of Visits 41    Date for PT Re-Evaluation 01/21/20    Authorization Type [employer] Medicare    Authorization Time Period 05/28/19-07/23/19    PT Start Time 0845    PT Stop Time 0930    PT Time Calculation (min) 45 min    Equipment Utilized During Treatment Gait belt    Activity Tolerance Patient tolerated treatment well;No increased pain;Patient limited by fatigue    Behavior During Therapy [facility] for tasks assessed/performed           Past Medical History:  Diagnosis Date  . Anemia   . Anxiety   . CHF (congestive heart failure) ([place])   . Chronic kidney disease    esrd dialysis m/w/f  . Gout   . Hyperlipidemia   . Hypertension   . Myocardial infarction ([place]) 2010   10 years ago  . 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  . Peripheral vascular disease [facility])     Past Surgical History:  Procedure Laterality Date  . A/V FISTULAGRAM Right 09/06/2018   Procedure: A/V FISTULAGRAM;  Surgeon: Algernon Huxley, MD;  Location: [place] CV LAB;  Service: Cardiovascular;  Laterality: Right;  . A/V SHUNTOGRAM Left 06/21/2017   Procedure: A/V SHUNTOGRAM;  Surgeon: Katha Cabal, MD;  Location: [place] CV LAB;  Service: Cardiovascular;  Laterality: Left;  . A/V SHUNTOGRAM N/A 10/24/2018   Procedure: A/V SHUNTOGRAM;  Surgeon: Algernon Huxley, MD;  Location: [place] CV LAB;  Service: Cardiovascular;  Laterality: N/A;  . ABOVE KNEE LEG AMPUTATION Right 2020  . AMPUTATION Right 10/25/2018   Procedure: AMPUTATION ABOVE KNEE;  Surgeon: Algernon Huxley, MD;   Location: [facility] ORS;  Service: General;  Laterality: Right;  . APPLICATION OF WOUND VAC Right 04/11/2018   Procedure: APPLICATION OF WOUND VAC;  Surgeon: Algernon Huxley, MD;  Location: [facility] ORS;  Service: Vascular;  Laterality: Right;  . AV FISTULA PLACEMENT Left 09/18/2015   Procedure: INSERTION OF ARTERIOVENOUS (AV) GORE-TEX GRAFT ARM ( BRACH/AXILLARY GRAFT W/ INSTANT STICK GRAFT );  Surgeon: Katha Cabal, MD;  Location: [facility] ORS;  Service: Vascular;  Laterality: Left;  . AV FISTULA PLACEMENT Right 07/19/2018   Procedure: INSERTION OF ARTERIOVENOUS (AV) GORE-TEX GRAFT ARM ( BRACHIAL AXILLARY);  Surgeon: Algernon Huxley, MD;  Location: [facility] ORS;  Service: Vascular;  Laterality: Right;  . DIALYSIS FISTULA CREATION Right 10/2017   right chest perm cath  . DIALYSIS/PERMA CATHETER REMOVAL N/A 09/13/2018   Procedure: DIALYSIS/PERMA CATHETER REMOVAL;  Surgeon: Algernon Huxley, MD;  Location: [place] CV LAB;  Service: Cardiovascular;  Laterality: N/A;  . ESOPHAGOGASTRODUODENOSCOPY N/A 12/19/2017   Procedure: ESOPHAGOGASTRODUODENOSCOPY (EGD);  Surgeon: Lin Landsman, MD;  Location: [facility] ENDOSCOPY;  Service: Gastroenterology;  Laterality: N/A;  . LOWER EXTREMITY ANGIOGRAPHY Left 11/16/2017   Procedure: LOWER EXTREMITY ANGIOGRAPHY;  Surgeon: Algernon Huxley, MD;  Location: [place] CV LAB;  Service: Cardiovascular;  Laterality: Left;  . LOWER EXTREMITY ANGIOGRAPHY Right 01/18/2018   Procedure: LOWER EXTREMITY ANGIOGRAPHY;  Surgeon: Algernon Huxley, MD;  Location: [place] CV LAB;  Service: Cardiovascular;  Laterality: Right;  . LOWER EXTREMITY ANGIOGRAPHY Left 04/02/2018   Procedure: LOWER EXTREMITY ANGIOGRAPHY;  Surgeon: Algernon Huxley, MD;  Location: [place] CV LAB;  Service: Cardiovascular;  Laterality: Left;  . LOWER EXTREMITY ANGIOGRAPHY Right 04/09/2018   Procedure: Lower Extremity Angiography with possible intervention;  Surgeon: Algernon Huxley, MD;  Location: [place] CV LAB;  Service:  Cardiovascular;  Laterality: Right;  . LOWER EXTREMITY ANGIOGRAPHY Right 07/23/2018   Procedure: Lower Extremity Angiography;  Surgeon: Algernon Huxley, MD;  Location: [place] CV LAB;  Service: Cardiovascular;  Laterality: Right;  . LOWER EXTREMITY ANGIOGRAPHY Right 09/13/2018   Procedure: LOWER EXTREMITY ANGIOGRAPHY;  Surgeon: Algernon Huxley, MD;  Location: [place] CV LAB;  Service: Cardiovascular;  Laterality: Right;  . LOWER EXTREMITY VENOGRAPHY Right 09/13/2018   Procedure: LOWER EXTREMITY VENOGRAPHY;  Surgeon: Algernon Huxley, MD;  Location: [place] CV LAB;  Service: Cardiovascular;  Laterality: Right;  . PERIPHERAL VASCULAR CATHETERIZATION Left 09/01/2015   Procedure: A/V Shuntogram/Fistulagram;  Surgeon: Katha Cabal, MD;  Location: [place] CV LAB;  Service: Cardiovascular;  Laterality: Left;  . PERIPHERAL VASCULAR CATHETERIZATION N/A 09/30/2015   Procedure: A/V Shuntogram/Fistulagram with perm cathether removal;  Surgeon: Algernon Huxley, MD;  Location: [place] CV LAB;  Service: Cardiovascular;  Laterality: N/A;  . PERIPHERAL VASCULAR CATHETERIZATION Left 09/30/2015   Procedure: A/V Shunt Intervention;  Surgeon: Algernon Huxley, MD;  Location: [place] CV LAB;  Service: Cardiovascular;  Laterality: Left;  . PERIPHERAL VASCULAR CATHETERIZATION Left 12/03/2015   Procedure: Thrombectomy;  Surgeon: Algernon Huxley, MD;  Location: [place] CV LAB;  Service: Cardiovascular;  Laterality: Left;  . PERIPHERAL VASCULAR CATHETERIZATION Left 01/28/2016   Procedure: Thrombectomy;  Surgeon: Algernon Huxley, MD;  Location: [place] CV LAB;  Service: Cardiovascular;  Laterality: Left;  . PERIPHERAL VASCULAR CATHETERIZATION N/A 01/28/2016   Procedure: A/V Shuntogram/Fistulagram;  Surgeon: Algernon Huxley, MD;  Location: [place] CV LAB;  Service: Cardiovascular;  Laterality: N/A;  . SKIN SPLIT GRAFT Right 05/24/2018   Procedure: SKIN GRAFT SPLIT THICKNESS ( RIGHT CALF);  Surgeon: Algernon Huxley, MD;  Location: [facility] ORS;  Service: Vascular;  Laterality: Right;  . UPPER EXTREMITY ANGIOGRAPHY  10/24/2018   Procedure: Upper Extremity Angiography;  Surgeon: Algernon Huxley, MD;  Location: [place] CV LAB;  Service: Cardiovascular;;  . UPPER EXTREMITY ANGIOGRAPHY Right 02/14/2019   Procedure: UPPER EXTREMITY ANGIOGRAPHY;  Surgeon: Algernon Huxley, MD;  Location: [place] [facility] LAB;  Service: Cardiovascular;  Laterality: Right;  . WOUND DEBRIDEMENT Right 04/11/2018   Procedure: DEBRIDEMENT WOUND calf muscle and skin;  Surgeon: Algernon Huxley, MD;  Location: [facility] ORS;  Service: Vascular;  Laterality: Right;    There were no vitals filed for this visit.   Subjective Assessment - 11/07/19 0918    Subjective Patient reports that he is feeling good today and he is doing more walking at home.    Pertinent History Pt underwent Rt AKA amputation in June 2020 after difficulty with wound healing/infection. Pt DC hospital to SNF for rehab. He has since been back at home. Pt uses a manual WC or power chair for daily mobility. Pt takes tranportation services to HD MWF. Pt was recently fitted for his AKA prosthesis in Jan 2021.    Limitations Standing;Walking;House hold activities    How long can you sit comfortably? No difficulty    How long can  you stand comfortably? 1 minute    How long can you walk comfortably? unable    Currently in Pain? No/denies    Multiple Pain Sites No             Gait Training Nu-step x 5 mins L 3  Seated LLE hip flex and knee extension with 2 lbs x 20 x 2  Performed gait training with patientusingBRW Minimal education today required with patient regarding gait sequencing including advancing walker,unlocking R knee, utilizing momentum to create heel strike on the right, and to lock out right knee. He demonstrates improved independence today and has faster gait speed and stepping flow Improved R heel strike noted today and overall speed is improved as  well. Practiced partial step-through gait with patient todayand it is improved from last session. CGAand wheelchair follow.  Hecontinues torequire cues for upright posture He is able to ambulate150'x 2with BRWcompletinglaps in the hallway He continues to demonstrateheavy BUE support on walker handles during gait. Fatigue monitored by therapist throughout and one seated rest breaks provided between reps.  Patient performed with instruction, verbal cues, tactile cues of therapist: goal:increase tissue extensibility, promote proper posture, improve mobility                         PT Education - 11/07/19 0918    Education Details HEP    Person(s) Educated Patient    Methods Explanation    Comprehension Verbalized understanding;Tactile cues required;Need further instruction            PT Short Term Goals - 10/29/19 1000      PT SHORT TERM GOAL #1   Title Pt to demonstrate ability to perfrom STS transfer from chair c RW minGuard assist and lock his RLE knee joint without assist.    Baseline ModA from elevated surface at eval; 08/29/19: minA+1 with BUE support from regular height chair with arm rests; still requires elevated surface to perform.    Time 4    Period Weeks    Status Partially Met    Target Date 06/25/19      PT SHORT TERM GOAL #2   Title Pt to demonstrate 5 degrees hip flexion 10 degrees hip abdct ROM.    Baseline lacks 5 degrees from neutral in both at eval; At visit 10, has 0 degrees flexion/extension; still lacking 5 degrees ABDCT neutral; 08/29/19: R hip: lacking 5 degrees of extension, 15 degrees abduction    Time 4    Period Weeks    Status Achieved    Target Date 06/25/19             PT Long Term Goals - 10/29/19 1001      PT LONG TERM GOAL #1   Title Pt to demonstrates 15 degrees Rt hip ABDCT, 10 degrees Rt hip extension P/ROM to facilitate prosthesis motor control.    Baseline 08/29/19: R hip: lacking 5 degrees of  extension, 15 degrees abduction, 10/15/19= R hip lacking 10 deg extension    Time 8    Period Weeks    Status On-going    Target Date 01/21/20      PT LONG TERM GOAL #2   Title Pt to demonstrate 5/5 strength hip extension at 0 degrees flexion/extension    Baseline has 5/5 in some ranges, but not in ranges specific to standing or gait.; 08/29/19: Unable to get to neutral flexion extension but 5/5 R hip extension in other ranges, 10/15/19= -3/5 right  hip extension    Time 8    Period Weeks    Status On-going    Target Date 01/21/20      PT LONG TERM GOAL #3   Title Pt able to perfrom 5x STS from chair with RW, arms ad lib in <21sec    Baseline On 6/22 c RW and chair + 1 airex pad: 22.95sec (largely avoids use of RLE for power)    Time 12    Period Weeks    Status On-going    Target Date 01/21/20      PT LONG TERM GOAL #4   Title Pt will be able to ambulate at least 100' with RW and supervision with proper locking of R prosthesis in order to demonstrate safe household ambulation in order to improve independence with gait without need for power wheelchair    Baseline Tolerating distance, but speed is inapropriate for distance for energy conservation and safety. Making progress, but needs more improvement.    Time 12    Period Weeks    Status On-going    Target Date 01/21/20                 Plan - 11/07/19 0919    Clinical Impression Statement Patient demonstrates decreased gait speed with deviations and requires verbal and tactile cueing to maintain center of gravity during ambulation.  Patient also requires CGA for dynamic standing balance. Patient requires consistent cueing to maintain correct position during gait activities. Patient demonstrates difficulty with gait and increased postural sway while on rollator.  Patient will continue to benefit from skilled therapy in order to improve dynamic standing balance and increase endurance   Personal Factors and Comorbidities  Age;Fitness;Past/Current Experience;Education;Transportation    Examination-Activity Limitations Transfers;Dressing;Squat;Stairs;Stand;Bed Mobility    Examination-Participation Restrictions Meal Prep;Cleaning;Community Activity;Driving;Yard Work;Laundry;Shop    Stability/Clinical Decision Making Stable/Uncomplicated    Rehab Potential Good    PT Frequency 2x / week    PT Duration 12 weeks    PT Treatment/Interventions ADLs/Self Care Home Management;Cryotherapy;Electrical Stimulation;DME Instruction;Gait training;Stair training;Functional mobility training;Therapeutic activities;Therapeutic exercise;Balance training;Neuromuscular re-education;Cognitive remediation;Patient/family education;Prosthetic Training;Wheelchair mobility training;Passive range of motion;Dry needling;Scar mobilization;Spinal Manipulations;Joint Manipulations    PT Next Visit Plan Targeted Rt hip extension strengthening in both end range flexion and end range extension ranges, hip rotation strengthening, standing balance adn postural control.    PT Home Exercise Plan Medbridge Access Code: [email or web address] (prone positioning), L single leg bridges, L sidelying R hip abduction, hip isometric adduction    Consulted and Agree with Plan of Care Patient           Patient will benefit from skilled therapeutic intervention in order to improve the following deficits and impairments:  Decreased activity tolerance, Decreased balance, Decreased mobility, Decreased strength, Decreased cognition, Decreased knowledge of use of DME, Decreased knowledge of precautions, Decreased endurance, Decreased range of motion, Difficulty walking, Decreased safety awareness, Decreased skin integrity, Decreased scar mobility, Hypomobility, Postural dysfunction, Increased edema, Increased muscle spasms, Increased fascial restricitons, Prosthetic Dependency, Impaired tone, Improper body mechanics  Visit Diagnosis: Difficulty in walking, not elsewhere  classified  Unsteadiness on feet     Problem List Patient Active Problem List   Diagnosis Date Noted  . Cardiomyopathy ([place]) 01/22/2019  . CHF (congestive heart failure) ([place]) 01/22/2019  . Coronary disease 01/22/2019  . Above knee amputation of right lower extremity ([place]) 11/13/2018  . Leg ulcer, right, with fat layer exposed ([place]) 10/22/2018  . Cellulitis of right leg 07/21/2018  . Lower limb ulcer,  calf, right, limited to breakdown of skin ([place]) 06/08/2018  . PVD (peripheral vascular disease) ([place]) 04/17/2018  . Malnutrition of moderate degree 04/11/2018  . Pressure injury of skin 04/06/2018  . Altered mental status 04/04/2018  . Hypothermia 04/04/2018  . Hemodialysis graft malfunction ([place]) 03/26/2018  . Hypercholesterolemia 02/27/2018  . Diabetes ([place]) 02/27/2018  . Weakness of right lower extremity 01/20/2018  . Fever   . Periumbilical abdominal pain   . Confusion 12/22/2017  . Acute delirium 12/21/2017  . Protein-calorie malnutrition, severe 12/19/2017  . Intractable nausea and vomiting 12/18/2017  . Lymphedema 12/13/2017  . Cellulitis 11/27/2017  . Chest pain 11/19/2017  . Atherosclerosis of native arteries of the extremities with ulceration ([place]) 11/07/2017  . Twitching 01/03/2017  . Elevated troponin 10/02/2015  . Complications, dialysis, catheter, mechanical ([place]) 10/02/2015  . Musculoskeletal chest pain 09/28/2015  . Chronic diastolic CHF (congestive heart failure) ([place]) 09/28/2015  . End stage renal disease ([place]) 10/09/2012  . Hypertension 10/09/2012  . Gout 10/09/2012    Alanson Puls, PT DPT 11/07/2019, 9:21 AM  [place] MAIN [facility] SERVICES [address] [place], [place], [address] Phone: [phone]   Fax:  [phone]  Name: Zeph Riebel. MRN: [id number] Date of Birth: 1951-02-21

## 2019-11-12 ENCOUNTER — Ambulatory Visit: Payer: Medicare Other | Admitting: Physical Therapy

## 2019-11-14 ENCOUNTER — Ambulatory Visit: Payer: Medicare Other

## 2019-11-14 ENCOUNTER — Other Ambulatory Visit: Payer: Self-pay

## 2019-11-14 DIAGNOSIS — R262 Difficulty in walking, not elsewhere classified: Secondary | ICD-10-CM | POA: Diagnosis not present

## 2019-11-14 DIAGNOSIS — R2681 Unsteadiness on feet: Secondary | ICD-10-CM

## 2019-11-14 NOTE — Therapy (Signed)
[place] MAIN [facility] SERVICES [address] [place], [place], [address] Phone: [phone]   Fax:  [phone]  Physical Therapy Treatment  Patient Details  Name: Johnny [name]. MRN: [id number] Date of Birth: [birth year] Referring Provider (PT): Eulogio Ditch, NP    Encounter Date: 11/14/2019   PT End of Session - 11/14/19 0851    Visit Number 38    Number of Visits 41    Date for PT Re-Evaluation 01/21/20    Authorization Type [employer] Medicare    Authorization Time Period 10/29/19-01/21/20    PT Start Time 0843    PT Stop Time 0943    PT Time Calculation (min) 60 min    Equipment Utilized During Treatment Gait belt    Activity Tolerance Patient tolerated treatment well;No increased pain;Patient limited by fatigue    Behavior During Therapy [facility] for tasks assessed/performed           Past Medical History:  Diagnosis Date  . Anemia   . Anxiety   . CHF (congestive heart failure) ([place])   . Chronic kidney disease    esrd dialysis m/w/f  . Gout   . Hyperlipidemia   . Hypertension   . Myocardial infarction ([place]) 2010   10 years ago  . 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  . Peripheral vascular disease [facility])     Past Surgical History:  Procedure Laterality Date  . A/V FISTULAGRAM Right 09/06/2018   Procedure: A/V FISTULAGRAM;  Surgeon: Algernon Huxley, MD;  Location: [place] CV LAB;  Service: Cardiovascular;  Laterality: Right;  . A/V SHUNTOGRAM Left 06/21/2017   Procedure: A/V SHUNTOGRAM;  Surgeon: Katha Cabal, MD;  Location: [place] CV LAB;  Service: Cardiovascular;  Laterality: Left;  . A/V SHUNTOGRAM N/A 10/24/2018   Procedure: A/V SHUNTOGRAM;  Surgeon: Algernon Huxley, MD;  Location: [place] CV LAB;  Service: Cardiovascular;  Laterality: N/A;  . ABOVE KNEE LEG AMPUTATION Right 2020  . AMPUTATION Right 10/25/2018   Procedure: AMPUTATION ABOVE KNEE;  Surgeon: Algernon Huxley, MD;   Location: [facility] ORS;  Service: General;  Laterality: Right;  . APPLICATION OF WOUND VAC Right 04/11/2018   Procedure: APPLICATION OF WOUND VAC;  Surgeon: Algernon Huxley, MD;  Location: [facility] ORS;  Service: Vascular;  Laterality: Right;  . AV FISTULA PLACEMENT Left 09/18/2015   Procedure: INSERTION OF ARTERIOVENOUS (AV) GORE-TEX GRAFT ARM ( BRACH/AXILLARY GRAFT W/ INSTANT STICK GRAFT );  Surgeon: Katha Cabal, MD;  Location: [facility] ORS;  Service: Vascular;  Laterality: Left;  . AV FISTULA PLACEMENT Right 07/19/2018   Procedure: INSERTION OF ARTERIOVENOUS (AV) GORE-TEX GRAFT ARM ( BRACHIAL AXILLARY);  Surgeon: Algernon Huxley, MD;  Location: [facility] ORS;  Service: Vascular;  Laterality: Right;  . DIALYSIS FISTULA CREATION Right 10/2017   right chest perm cath  . DIALYSIS/PERMA CATHETER REMOVAL N/A 09/13/2018   Procedure: DIALYSIS/PERMA CATHETER REMOVAL;  Surgeon: Algernon Huxley, MD;  Location: [name] CV LAB;  Service: Cardiovascular;  Laterality: N/A;  . ESOPHAGOGASTRODUODENOSCOPY N/A 12/19/2017   Procedure: ESOPHAGOGASTRODUODENOSCOPY (EGD);  Surgeon: Lin Landsman, MD;  Location: [facility] ENDOSCOPY;  Service: Gastroenterology;  Laterality: N/A;  . LOWER EXTREMITY ANGIOGRAPHY Left 11/16/2017   Procedure: LOWER EXTREMITY ANGIOGRAPHY;  Surgeon: Algernon Huxley, MD;  Location: [place] CV LAB;  Service: Cardiovascular;  Laterality: Left;  . LOWER EXTREMITY ANGIOGRAPHY Right 01/18/2018   Procedure: LOWER EXTREMITY ANGIOGRAPHY;  Surgeon: Algernon Huxley, MD;  Location: [place] CV LAB;  Service: Cardiovascular;  Laterality: Right;  . LOWER EXTREMITY ANGIOGRAPHY Left 04/02/2018   Procedure: LOWER EXTREMITY ANGIOGRAPHY;  Surgeon: Algernon Huxley, MD;  Location: [place] CV LAB;  Service: Cardiovascular;  Laterality: Left;  . LOWER EXTREMITY ANGIOGRAPHY Right 04/09/2018   Procedure: Lower Extremity Angiography with possible intervention;  Surgeon: Algernon Huxley, MD;  Location: [place] CV LAB;  Service:  Cardiovascular;  Laterality: Right;  . LOWER EXTREMITY ANGIOGRAPHY Right 07/23/2018   Procedure: Lower Extremity Angiography;  Surgeon: Algernon Huxley, MD;  Location: [place] CV LAB;  Service: Cardiovascular;  Laterality: Right;  . LOWER EXTREMITY ANGIOGRAPHY Right 09/13/2018   Procedure: LOWER EXTREMITY ANGIOGRAPHY;  Surgeon: Algernon Huxley, MD;  Location: [place] CV LAB;  Service: Cardiovascular;  Laterality: Right;  . LOWER EXTREMITY VENOGRAPHY Right 09/13/2018   Procedure: LOWER EXTREMITY VENOGRAPHY;  Surgeon: Algernon Huxley, MD;  Location: [place] CV LAB;  Service: Cardiovascular;  Laterality: Right;  . PERIPHERAL VASCULAR CATHETERIZATION Left 09/01/2015   Procedure: A/V Shuntogram/Fistulagram;  Surgeon: Katha Cabal, MD;  Location: [place] CV LAB;  Service: Cardiovascular;  Laterality: Left;  . PERIPHERAL VASCULAR CATHETERIZATION N/A 09/30/2015   Procedure: A/V Shuntogram/Fistulagram with perm cathether removal;  Surgeon: Algernon Huxley, MD;  Location: [place] CV LAB;  Service: Cardiovascular;  Laterality: N/A;  . PERIPHERAL VASCULAR CATHETERIZATION Left 09/30/2015   Procedure: A/V Shunt Intervention;  Surgeon: Algernon Huxley, MD;  Location: [place] CV LAB;  Service: Cardiovascular;  Laterality: Left;  . PERIPHERAL VASCULAR CATHETERIZATION Left 12/03/2015   Procedure: Thrombectomy;  Surgeon: Algernon Huxley, MD;  Location: [place] CV LAB;  Service: Cardiovascular;  Laterality: Left;  . PERIPHERAL VASCULAR CATHETERIZATION Left 01/28/2016   Procedure: Thrombectomy;  Surgeon: Algernon Huxley, MD;  Location: [place] CV LAB;  Service: Cardiovascular;  Laterality: Left;  . PERIPHERAL VASCULAR CATHETERIZATION N/A 01/28/2016   Procedure: A/V Shuntogram/Fistulagram;  Surgeon: Algernon Huxley, MD;  Location: [place] CV LAB;  Service: Cardiovascular;  Laterality: N/A;  . SKIN SPLIT GRAFT Right 05/24/2018   Procedure: SKIN GRAFT SPLIT THICKNESS ( RIGHT CALF);  Surgeon: Algernon Huxley, MD;  Location: [facility] ORS;  Service: Vascular;  Laterality: Right;  . UPPER EXTREMITY ANGIOGRAPHY  10/24/2018   Procedure: Upper Extremity Angiography;  Surgeon: Algernon Huxley, MD;  Location: [place] CV LAB;  Service: Cardiovascular;;  . UPPER EXTREMITY ANGIOGRAPHY Right 02/14/2019   Procedure: UPPER EXTREMITY ANGIOGRAPHY;  Surgeon: Algernon Huxley, MD;  Location: [place] CV LAB;  Service: Cardiovascular;  Laterality: Right;  . WOUND DEBRIDEMENT Right 04/11/2018   Procedure: DEBRIDEMENT WOUND calf muscle and skin;  Surgeon: Algernon Huxley, MD;  Location: [facility] ORS;  Service: Vascular;  Laterality: Right;    There were no vitals filed for this visit.   Subjective Assessment - 11/14/19 0850    Subjective Pt doing well today. Pt says he had a procedure last week to remove a mole from skin, no complications.    Pertinent History Pt underwent Rt AKA amputation in June 2020 after difficulty with wound healing/infection. Pt DC hospital to SNF for rehab. He has since been back at home. Pt uses a manual WC or power chair for daily mobility. Pt takes tranportation services to HD MWF. Pt was recently fitted for his AKA prosthesis in Jan 2021.    Currently in Pain? No/denies  [facility] PT Assessment - 11/14/19 0001      Observation/Other Assessments   Focus on Therapeutic Outcomes (FOTO)  FOTO: 54/100 (11/14/19)   Eval (Jan)  48/100; 08/29/19: 36/100         INTERVENTION THIS DATE: *prosthesis donned entirety of session  -Nustep x5 minutes level 3, seat 10, arms 10  -seated marching 1x15 0lbs  -seated marching 1x15 c 3lb  -STS from elevated surface 1x10 c RW (primarily LLE)  -SEATED rIGHT HIP EXTENSION C BTB from end range flexion to floor 2x15   -Rt STKC P/ROMN 2x30sec  -hooklying Frog Adductor stretch 2x60 Sec (hips at 75 degrees flexion)  -RLE hip add stretch (hip at 0 degrees flexion) 3x30sec ( -RLE SLR c gait belt mediated TKE 2x15   -Residual limb assessment for  weightbearing potential- (good skin healing, good pressure tolerance, adequate sensation)  -Observation of patient donning sock and prosthesis in sitting, adjustment in standing; appears adequate, questionable safety with stnading adjustmen tdue ot balance impairment and nooed for BUE use; adequate adjustment of tension)  -socket continues to have excessive space at distal end , fair contact with distal lateral limb, too little contact with distal medial limb.  -Gait assessment overground with RW; still forward flexed, improved Rt foot placement at initial contact whereas patient has been habitually using an abducted gait *will contact prosthetist for feedback and questions prior to next session *pt needs updated home stretching program issued     PT Short Term Goals - 10/29/19 1000      PT SHORT TERM GOAL #1   Title Pt to demonstrate ability to perfrom STS transfer from chair c RW minGuard assist and lock his RLE knee joint without assist.    Baseline ModA from elevated surface at eval; 08/29/19: minA+1 with BUE support from regular height chair with arm rests; still requires elevated surface to perform.    Time 4    Period Weeks    Status Partially Met    Target Date 06/25/19      PT SHORT TERM GOAL #2   Title Pt to demonstrate 5 degrees hip flexion 10 degrees hip abdct ROM.    Baseline lacks 5 degrees from neutral in both at eval; At visit 10, has 0 degrees flexion/extension; still lacking 5 degrees ABDCT neutral; 08/29/19: R hip: lacking 5 degrees of extension, 15 degrees abduction    Time 4    Period Weeks    Status Achieved    Target Date 06/25/19             PT Long Term Goals - 10/29/19 1001      PT LONG TERM GOAL #1   Title Pt to demonstrates 15 degrees Rt hip ABDCT, 10 degrees Rt hip extension P/ROM to facilitate prosthesis motor control.    Baseline 08/29/19: R hip: lacking 5 degrees of extension, 15 degrees abduction, 10/15/19= R hip lacking 10 deg extension    Time 8     Period Weeks    Status On-going    Target Date 01/21/20      PT LONG TERM GOAL #2   Title Pt to demonstrate 5/5 strength hip extension at 0 degrees flexion/extension    Baseline has 5/5 in some ranges, but not in ranges specific to standing or gait.; 08/29/19: Unable to get to neutral flexion extension but 5/5 R hip extension in other ranges, 10/15/19= -3/5 right hip extension    Time 8    Period Weeks    Status On-going  Target Date 01/21/20      PT LONG TERM GOAL #3   Title Pt able to perfrom 5x STS from chair with RW, arms ad lib in <21sec    Baseline On 6/22 c RW and chair + 1 airex pad: 22.95sec (largely avoids use of RLE for power)    Time 12    Period Weeks    Status On-going    Target Date 01/21/20      PT LONG TERM GOAL #4   Title Pt will be able to ambulate at least 100' with RW and supervision with proper locking of R prosthesis in order to demonstrate safe household ambulation in order to improve independence with gait without need for power wheelchair    Baseline Tolerating distance, but speed is inapropriate for distance for energy conservation and safety. Making progress, but needs more improvement.    Time 12    Period Weeks    Status On-going    Target Date 01/21/20                 Plan - 11/14/19 0854    Clinical Impression Statement Pt able to complete entire session as planned with rest breaks provided as needed. Pt maintains high level of focus and motivation. Extensive verbal, visual, and tactile cues are provided for most accurate form possible. Author provides minA intermittently for full ROM when needed. End os session with strong focus on limb and prosthesis set up since recent updates to socket. Will contact prosthetist before next visit regarding excessive space at end of socket. Pt no longer having groin pain issues. Overall pt continues to make steady progress toward treatment goals.    Personal Factors and Comorbidities Age;Fitness;Past/Current  Experience;Education;Transportation    Examination-Activity Limitations Transfers;Dressing;Squat;Stairs;Stand;Bed Mobility    Examination-Participation Restrictions Community Activity    Stability/Clinical Decision Making Stable/Uncomplicated    Clinical Decision Making Low    Rehab Potential Good    PT Frequency 2x / week    PT Duration 12 weeks    PT Treatment/Interventions ADLs/Self Care Home Management;Cryotherapy;Electrical Stimulation;DME Instruction;Gait training;Stair training;Functional mobility training;Therapeutic activities;Therapeutic exercise;Balance training;Neuromuscular re-education;Cognitive remediation;Patient/family education;Prosthetic Training;Wheelchair mobility training;Passive range of motion;Dry needling;Scar mobilization;Spinal Manipulations;Joint Manipulations    PT Next Visit Plan Targeted Rt hip extension strengthening in both end range flexion and end range extension ranges, hip rotation strengthening, standing balance adn postural control.    PT Home Exercise Plan Medbridge Access Code: [email or web address] (prone positioning), L single leg bridges, L sidelying R hip abduction, hip isometric adduction    Consulted and Agree with Plan of Care Patient           Patient will benefit from skilled therapeutic intervention in order to improve the following deficits and impairments:  Decreased activity tolerance, Decreased balance, Decreased mobility, Decreased strength, Decreased cognition, Decreased knowledge of use of DME, Decreased knowledge of precautions, Decreased endurance, Decreased range of motion, Difficulty walking, Decreased safety awareness, Decreased skin integrity, Decreased scar mobility, Hypomobility, Postural dysfunction, Increased edema, Increased muscle spasms, Increased fascial restricitons, Prosthetic Dependency, Impaired tone, Improper body mechanics  Visit Diagnosis: Difficulty in walking, not elsewhere classified  Unsteadiness on feet     Problem  List Patient Active Problem List   Diagnosis Date Noted  . Cardiomyopathy ([place]) 01/22/2019  . CHF (congestive heart failure) ([place]) 01/22/2019  . Coronary disease 01/22/2019  . Above knee amputation of right lower extremity ([place]) 11/13/2018  . Leg ulcer, right, with fat layer exposed ([place]) 10/22/2018  . Cellulitis of right leg  07/21/2018  . Lower limb ulcer, calf, right, limited to breakdown of skin ([place]) 06/08/2018  . PVD (peripheral vascular disease) ([place]) 04/17/2018  . Malnutrition of moderate degree 04/11/2018  . Pressure injury of skin 04/06/2018  . Altered mental status 04/04/2018  . Hypothermia 04/04/2018  . Hemodialysis graft malfunction ([place]) 03/26/2018  . Hypercholesterolemia 02/27/2018  . Diabetes ([place]) 02/27/2018  . Weakness of right lower extremity 01/20/2018  . Fever   . Periumbilical abdominal pain   . Confusion 12/22/2017  . Acute delirium 12/21/2017  . Protein-calorie malnutrition, severe 12/19/2017  . Intractable nausea and vomiting 12/18/2017  . Lymphedema 12/13/2017  . Cellulitis 11/27/2017  . Chest pain 11/19/2017  . Atherosclerosis of native arteries of the extremities with ulceration ([place]) 11/07/2017  . Twitching 01/03/2017  . Elevated troponin 10/02/2015  . Complications, dialysis, catheter, mechanical ([place]) 10/02/2015  . Musculoskeletal chest pain 09/28/2015  . Chronic diastolic CHF (congestive heart failure) ([place]) 09/28/2015  . End stage renal disease ([place]) 10/09/2012  . Hypertension 10/09/2012  . Gout 10/09/2012   10:03 AM, 11/14/19 Etta Grandchild, PT, DPT Physical Therapist - [place] [phone]     Etta Grandchild 11/14/2019, 9:26 AM  [place] MAIN [facility] SERVICES [address] [place], [place], [address] Phone: [phone]   Fax:  [phone]  Name: Saifullah Jolley. MRN: [id number] Date of Birth: 01/08/1951

## 2019-11-19 ENCOUNTER — Ambulatory Visit: Payer: Medicare Other | Admitting: Physical Therapy

## 2019-11-19 ENCOUNTER — Other Ambulatory Visit: Payer: Self-pay

## 2019-11-19 ENCOUNTER — Encounter: Payer: Self-pay | Admitting: Physical Therapy

## 2019-11-19 DIAGNOSIS — R2681 Unsteadiness on feet: Secondary | ICD-10-CM

## 2019-11-19 DIAGNOSIS — R262 Difficulty in walking, not elsewhere classified: Secondary | ICD-10-CM

## 2019-11-19 NOTE — Therapy (Signed)
[place] [facility]IN [facility] SERVICES [address] [place], [place], [address] Phone: [phone]   Fax:  [phone]  Physical Therapy Treatment  Patient Details  Name: Johnny [name]. MRN: [id number] Date of Birth: 01-13-1951 Referring Provider (PT): Eulogio Ditch, NP    Encounter Date: 11/19/2019   PT End of Session - 11/19/19 1007    Visit Number 39    Number of Visits 41    Date for PT Re-Evaluation 01/21/20    Authorization Type [employer] Medicare    Authorization Time Period 10/29/19-01/21/20    PT Start Time 0900    PT Stop Time 0945    PT Time Calculation (min) 45 min    Equipment Utilized During Treatment Gait belt    Activity Tolerance Patient tolerated treatment well;No increased pain;Patient limited by fatigue    Behavior During Therapy [facility] for tasks assessed/performed           Past Medical History:  Diagnosis Date  . Anemia   . Anxiety   . CHF (congestive heart failure) ([place])   . Chronic kidney disease    esrd dialysis m/w/f  . Gout   . Hyperlipidemia   . Hypertension   . Myocardial infarction ([place]) 2010   10 years ago  . 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  . Peripheral vascular disease [facility]     Past Surgical History:  Procedure Laterality Date  . A/V FISTULAGRAM Right 09/06/2018   Procedure: A/V FISTULAGRAM;  Surgeon: Algernon Huxley, MD;  Location: [place] [facility];  Service: Cardiovascular;  Laterality: Right;  . A/V SHUNTOGRAM Left 06/21/2017   Procedure: A/V SHUNTOGRAM;  Surgeon: Katha Cabal, MD;  Location: [place] [facility];  Service: Cardiovascular;  Laterality: Left;  . A/V SHUNTOGRAM N/A 10/24/2018   Procedure: A/V SHUNTOGRAM;  Surgeon: Algernon Huxley, MD;  Location: [place] [facility];  Service: Cardiovascular;  Laterality: N/A;  . ABOVE KNEE LEG AMPUTATION Right 2020  . AMPUTATION Right 10/25/2018   Procedure: AMPUTATION ABOVE KNEE;  Surgeon: Algernon Huxley, MD;   Location: [facility] OR[facility];  Service: General;  Laterality: Right;  . APPLICATION OF WOUND VAC Right 04/11/2018   Procedure: APPLICATION OF WOUND VAC;  Surgeon: Algernon Huxley, MD;  Location: [facility] OR[facility];  Service: Vascular;  Laterality: Right;  . AV FISTULA PLACEMENT Left 09/18/2015   Procedure: INSERTION OF ARTERIOVENOUS (AV) GORE-TEX GRAFT ARM ( BRACH/AXILLARY GRAFT W/ INSTANT STICK GRAFT );  Surgeon: Katha Cabal, MD;  Location: [facility] OR[facility];  Service: Vascular;  Laterality: Left;  . AV FISTULA PLACEMENT Right 07/19/2018   Procedure: INSERTION OF ARTERIOVENOUS (AV) GORE-TEX GRAFT ARM ( BRACHIAL AXILLARY);  Surgeon: Algernon Huxley, MD;  Location: [facility] ORS;  Service: Vascular;  Laterality: Right;  . DIALYSIS FISTULA CREATION Right 10/2017   right chest perm cath  . DIALYSIS/PERMA CATHETER REMOVAL N/A 09/13/2018   Procedure: DIALYSIS/PERMA CATHETER REMOVAL;  Surgeon: Algernon Huxley, MD;  Location: [place] [facility];  Service: Cardiovascular;  Laterality: N/A;  . ESOPHAGOGASTRODUODENOSCOPY N/A 12/19/2017   Procedure: ESOPHAGOGASTRODUODENOSCOPY (EGD);  Surgeon: Lin Landsman, MD;  Location: [facility] ENDOSCOPY;  Service: Gastroenterology;  Laterality: N/A;  . LOWER EXTREMITY ANGIOGRAPHY Left 11/16/2017   Procedure: LOWER EXTREMITY ANGIOGRAPHY;  Surgeon: Algernon Huxley, MD;  Location: [place] [facility];  Service: Cardiovascular;  Laterality: Left;  . LOWER EXTREMITY ANGIOGRAPHY Right 01/18/2018   Procedure: LOWER EXTREMITY ANGIOGRAPHY;  Surgeon: Algernon Huxley, MD;  Location: [place] [facility];  Service: Cardiovascular;  Laterality: Right;  . LOWER EXTREMITY ANGIOGRAPHY Left 04/02/2018   Procedure: LOWER EXTREMITY ANGIOGRAPHY;  Surgeon: Algernon Huxley, MD;  Location: [place] [facility];  Service: Cardiovascular;  Laterality: Left;  . LOWER EXTREMITY ANGIOGRAPHY Right 04/09/2018   Procedure: Lower Extremity Angiography with possible intervention;  Surgeon: Algernon Huxley, MD;  Location: [place] [facility];  Service:  Cardiovascular;  Laterality: Right;  . LOWER EXTREMITY ANGIOGRAPHY Right 07/23/2018   Procedure: Lower Extremity Angiography;  Surgeon: Algernon Huxley, MD;  Location: [place] [facility];  Service: Cardiovascular;  Laterality: Right;  . LOWER EXTREMITY ANGIOGRAPHY Right 09/13/2018   Procedure: LOWER EXTREMITY ANGIOGRAPHY;  Surgeon: Algernon Huxley, MD;  Location: [place] [facility];  Service: Cardiovascular;  Laterality: Right;  . LOWER EXTREMITY VENOGRAPHY Right 09/13/2018   Procedure: LOWER EXTREMITY VENOGRAPHY;  Surgeon: Algernon Huxley, MD;  Location: [place] [facility];  Service: Cardiovascular;  Laterality: Right;  . PERIPHERAL VASCULAR CATHETERIZATION Left 09/01/2015   Procedure: A/V Shuntogram/Fistulagram;  Surgeon: Katha Cabal, MD;  Location: [place] [facility];  Service: Cardiovascular;  Laterality: Left;  . PERIPHERAL VASCULAR CATHETERIZATION N/A 09/30/2015   Procedure: A/V Shuntogram/Fistulagram with perm cathether removal;  Surgeon: Algernon Huxley, MD;  Location: [place] [facility];  Service: Cardiovascular;  Laterality: N/A;  . PERIPHERAL VASCULAR CATHETERIZATION Left 09/30/2015   Procedure: A/V Shunt Intervention;  Surgeon: Algernon Huxley, MD;  Location: [place] [facility];  Service: Cardiovascular;  Laterality: Left;  . PERIPHERAL VASCULAR CATHETERIZATION Left 12/03/2015   Procedure: Thrombectomy;  Surgeon: Algernon Huxley, MD;  Location: [place] [facility];  Service: Cardiovascular;  Laterality: Left;  . PERIPHERAL VASCULAR CATHETERIZATION Left 01/28/2016   Procedure: Thrombectomy;  Surgeon: Algernon Huxley, MD;  Location: [place] [facility];  Service: Cardiovascular;  Laterality: Left;  . PERIPHERAL VASCULAR CATHETERIZATION N/A 01/28/2016   Procedure: A/V Shuntogram/Fistulagram;  Surgeon: Algernon Huxley, MD;  Location: [place] [facility];  Service: Cardiovascular;  Laterality: N/A;  . SKIN SPLIT GRAFT Right 05/24/2018   Procedure: SKIN GRAFT SPLIT THICKNESS ( RIGHT CALF);  Surgeon: Algernon Huxley, MD;  Location: [facility] OR[facility];  Service: Vascular;  Laterality: Right;  . UPPER EXTREMITY ANGIOGRAPHY  10/24/2018   Procedure: Upper Extremity Angiography;  Surgeon: Algernon Huxley, MD;  Location: [place] [facility];  Service: Cardiovascular;;  . UPPER EXTREMITY ANGIOGRAPHY Right 02/14/2019   Procedure: UPPER EXTREMITY ANGIOGRAPHY;  Surgeon: Algernon Huxley, MD;  Location: [place] [facility];  Service: Cardiovascular;  Laterality: Right;  . WOUND DEBRIDEMENT Right 04/11/2018   Procedure: DEBRIDEMENT WOUND calf muscle and skin;  Surgeon: Algernon Huxley, MD;  Location: [facility] OR[facility];  Service: Vascular;  Laterality: Right;    There were no vitals filed for this visit.   Subjective Assessment - 11/19/19 1006    Subjective Pt doing well today. No new complaints.    Pertinent History Pt underwent Rt AKA amputation in June 2020 after difficulty with wound healing/infection. Pt DC hospital to SNF for rehab. He has since been back at home. Pt uses a manual WC or power chair for daily mobility. Pt takes tranportation services to HD MWF. Pt was recently fitted for his AKA prosthesis in Jan 2021.    Limitations Standing;Walking;House hold activities    How long can you sit comfortably? No difficulty    How long can you stand comfortably? 1 minute    How  long can you walk comfortably? unable             Gait Training Performed gait training with patientusing rolling walker with CGA x 1  Minimal education today required with patient regarding gait sequencing including advancing walker,unlocking R knee, utilizing momentum to create heel strike on the right, and to lock out right knee.  He demonstrates improved independence today and he does not experience any buckles today. Improved R heel strike noted today and overall speed is improved as well. Practiced partial step-through gait with patient todayand it is improved from last session. CGAand wheelchair follow.  Herequires some cues for upright posture  but less than previous session. He is able to ambulate150'x 2with BRW completinglaps in the hallway He continues to demonstrateheavy BUE support on walker handles during gait. Fatigue monitored by therapist throughout and one seated rest breaks provided between reps.  Transfers power wc to standing in the parallel bars and gait training in parallel bars 10 feet x 4 with good heel strike and CGA  Patient performed with instruction, verbal cues, tactile cues of therapist: goal:increase tissue extensibility, promote proper posture, improve mobility                         PT Education - 11/19/19 1006    Education Details HEP    Person(s) Educated Patient    Methods Explanation    Comprehension Verbalized understanding;Returned demonstration;Verbal cues required;Tactile cues required;Need further instruction            PT Short Term Goals - 10/29/19 1000      PT SHORT TERM GOAL #1   Title Pt to demonstrate ability to perfrom STS transfer from chair c RW minGuard assist and lock his RLE knee joint without assist.    Baseline ModA from elevated surface at eval; 08/29/19: minA+1 with BUE support from regular height chair with arm rests; still requires elevated surface to perform.    Time 4    Period Weeks    Status Partially Met    Target Date 06/25/19      PT SHORT TERM GOAL #2   Title Pt to demonstrate 5 degrees hip flexion 10 degrees hip abdct ROM.    Baseline lacks 5 degrees from neutral in both at eval; At visit 10, has 0 degrees flexion/extension; still lacking 5 degrees ABDCT neutral; 08/29/19: R hip: lacking 5 degrees of extension, 15 degrees abduction    Time 4    Period Weeks    Status Achieved    Target Date 06/25/19             PT Long Term Goals - 10/29/19 1001      PT LONG TERM GOAL #1   Title Pt to demonstrates 15 degrees Rt hip ABDCT, 10 degrees Rt hip extension P/ROM to facilitate prosthesis motor control.    Baseline 08/29/19: R hip:  lacking 5 degrees of extension, 15 degrees abduction, 10/15/19= R hip lacking 10 deg extension    Time 8    Period Weeks    Status On-going    Target Date 01/21/20      PT LONG TERM GOAL #2   Title Pt to demonstrate 5/5 strength hip extension at 0 degrees flexion/extension    Baseline has 5/5 in some ranges, but not in ranges specific to standing or gait.; 08/29/19: Unable to get to neutral flexion extension but 5/5 R hip extension in other ranges, 10/15/19= -3/5 right hip extension  Time 8    Period Weeks    Status On-going    Target Date 01/21/20      PT LONG TERM GOAL #3   Title Pt able to perfrom 5x STS from chair with RW, arms ad lib in <21sec    Baseline On 6/22 c RW and chair + 1 airex pad: 22.95sec (largely avoids use of RLE for power)    Time 12    Period Weeks    Status On-going    Target Date 01/21/20      PT LONG TERM GOAL #4   Title Pt will be able to ambulate at least 100' with RW and supervision with proper locking of R prosthesis in order to demonstrate safe household ambulation in order to improve independence with gait without need for power wheelchair    Baseline Tolerating distance, but speed is inapropriate for distance for energy conservation and safety. Making progress, but needs more improvement.    Time 12    Period Weeks    Status On-going    Target Date 01/21/20                 Plan - 11/19/19 1008    Clinical Impression Statement p    Personal Factors and Comorbidities Age;Fitness;Past/Current Experience;Education;Transportation    Examination-Activity Limitations Transfers;Dressing;Squat;Stairs;Stand;Bed Mobility    Examination-Participation Restrictions Community Activity    Stability/Clinical Decision Making Stable/Uncomplicated    Rehab Potential Good    PT Frequency 2x / week    PT Duration 12 weeks    PT Treatment/Interventions ADLs/Self Care Home Management;Cryotherapy;Electrical Stimulation;DME Instruction;Gait training;Stair  training;Functional mobility training;Therapeutic activities;Therapeutic exercise;Balance training;Neuromuscular re-education;Cognitive remediation;Patient/family education;Prosthetic Training;Wheelchair mobility training;Passive range of motion;Dry needling;Scar mobilization;Spinal Manipulations;Joint Manipulations    PT Next Visit Plan Targeted Rt hip extension strengthening in both end range flexion and end range extension ranges, hip rotation strengthening, standing balance adn postural control.    PT Home Exercise Plan Medbridge Access Code: [email or web address] (prone positioning), L single leg bridges, L sidelying R hip abduction, hip isometric adduction    Consulted and Agree with Plan of Care Patient           Patient will benefit from skilled therapeutic intervention in order to improve the following deficits and impairments:  Decreased activity tolerance, Decreased balance, Decreased mobility, Decreased strength, Decreased cognition, Decreased knowledge of use of DME, Decreased knowledge of precautions, Decreased endurance, Decreased range of motion, Difficulty walking, Decreased safety awareness, Decreased skin integrity, Decreased scar mobility, Hypomobility, Postural dysfunction, Increased edema, Increased muscle spasms, Increased fascial restricitons, Prosthetic Dependency, Impaired tone, Improper body mechanics  Visit Diagnosis: Difficulty in walking, not elsewhere classified  Unsteadiness on feet     Problem List Patient Active Problem List   Diagnosis Date Noted  . Cardiomyopathy ([place]) 01/22/2019  . CHF (congestive heart failure) ([place]) 01/22/2019  . Coronary disease 01/22/2019  . Above knee amputation of right lower extremity ([place]) 11/13/2018  . Leg ulcer, right, with fat layer exposed ([place]) 10/22/2018  . Cellulitis of right leg 07/21/2018  . Lower limb ulcer, calf, right, limited to breakdown of skin ([place]) 06/08/2018  . PVD (peripheral vascular disease) ([place]) 04/17/2018  .  Malnutrition of moderate degree 04/11/2018  . Pressure injury of skin 04/06/2018  . Altered mental status 04/04/2018  . Hypothermia 04/04/2018  . Hemodialysis graft malfunction ([place]) 03/26/2018  . Hypercholesterolemia 02/27/2018  . Diabetes ([place]) 02/27/2018  . Weakness of right lower extremity 01/20/2018  . Fever   . Periumbilical abdominal pain   .  Confusion 12/22/2017  . Acute delirium 12/21/2017  . Protein-calorie malnutrition, severe 12/19/2017  . Intractable nausea and vomiting 12/18/2017  . Lymphedema 12/13/2017  . Cellulitis 11/27/2017  . Chest pain 11/19/2017  . Atherosclerosis of native arteries of the extremities with ulceration ([place]) 11/07/2017  . Twitching 01/03/2017  . Elevated troponin 10/02/2015  . Complications, dialysis, catheter, mechanical ([place]) 10/02/2015  . Musculoskeletal chest pain 09/28/2015  . Chronic diastolic CHF (congestive heart failure) ([place]) 09/28/2015  . End stage renal disease ([place]) 10/09/2012  . Hypertension 10/09/2012  . Gout 10/09/2012    Alanson Puls, PT DPT 11/19/2019, 10:10 AM  [place] [facility]IN [facility] SERVICES [address] [place], [place], [address] Phone: [phone]   Fax:  [phone]  Name: Azlan Hanway. MRN: [id number] Date of Birth: 02/05/1951

## 2019-11-21 ENCOUNTER — Ambulatory Visit: Payer: Medicare Other | Admitting: Physical Therapy

## 2019-11-26 ENCOUNTER — Ambulatory Visit: Payer: Medicare Other

## 2019-11-26 ENCOUNTER — Encounter: Payer: Self-pay | Admitting: Physical Therapy

## 2019-11-26 ENCOUNTER — Other Ambulatory Visit: Payer: Self-pay

## 2019-11-26 DIAGNOSIS — R262 Difficulty in walking, not elsewhere classified: Secondary | ICD-10-CM | POA: Diagnosis not present

## 2019-11-26 DIAGNOSIS — R2681 Unsteadiness on feet: Secondary | ICD-10-CM

## 2019-11-26 NOTE — Therapy (Addendum)
[place] MAIN [facility] SERVICES [address] [place], [place], [address] Phone: [phone]   Fax:  [phone]  Physical Therapy Treatment &  Progress Note   Dates of reporting period   10/15/19  to   11/27/2019   Patient Details  Name: Johnny [name]. MRN: [id number] Date of Birth: [birth year] Referring Provider (PT): Eulogio Ditch, NP    Encounter Date: 11/26/2019   PT End of Session - 11/26/19 1015    Visit Number 40    Number of Visits 41    Date for PT Re-Evaluation 01/21/20    Authorization Type [employer] Medicare    Authorization Time Period 10/29/19-01/21/20: FOTO by PT (assessed 7/20 score 55)    PT Start Time 0905    PT Stop Time 0950    PT Time Calculation (min) 45 min    Equipment Utilized During Treatment Gait belt    Activity Tolerance Patient tolerated treatment well;No increased pain;Patient limited by fatigue    Behavior During Therapy [facility] for tasks assessed/performed           Past Medical History:  Diagnosis Date  . Anemia   . Anxiety   . CHF (congestive heart failure) ([place])   . Chronic kidney disease    esrd dialysis m/w/f  . Gout   . Hyperlipidemia   . Hypertension   . Myocardial infarction ([place]) 2010   10 years ago  . 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  . Peripheral vascular disease [facility])     Past Surgical History:  Procedure Laterality Date  . A/V FISTULAGRAM Right 09/06/2018   Procedure: A/V FISTULAGRAM;  Surgeon: Algernon Huxley, MD;  Location: [place] CV LAB;  Service: Cardiovascular;  Laterality: Right;  . A/V SHUNTOGRAM Left 06/21/2017   Procedure: A/V SHUNTOGRAM;  Surgeon: Katha Cabal, MD;  Location: [place] CV LAB;  Service: Cardiovascular;  Laterality: Left;  . A/V SHUNTOGRAM N/A 10/24/2018   Procedure: A/V SHUNTOGRAM;  Surgeon: Algernon Huxley, MD;  Location: [place] CV LAB;  Service: Cardiovascular;  Laterality: N/A;  . ABOVE KNEE LEG AMPUTATION  Right 2020  . AMPUTATION Right 10/25/2018   Procedure: AMPUTATION ABOVE KNEE;  Surgeon: Algernon Huxley, MD;  Location: [facility] ORS;  Service: General;  Laterality: Right;  . APPLICATION OF WOUND VAC Right 04/11/2018   Procedure: APPLICATION OF WOUND VAC;  Surgeon: Algernon Huxley, MD;  Location: [facility] ORS;  Service: Vascular;  Laterality: Right;  . AV FISTULA PLACEMENT Left 09/18/2015   Procedure: INSERTION OF ARTERIOVENOUS (AV) GORE-TEX GRAFT ARM ( BRACH/AXILLARY GRAFT W/ INSTANT STICK GRAFT );  Surgeon: Katha Cabal, MD;  Location: [facility] ORS;  Service: Vascular;  Laterality: Left;  . AV FISTULA PLACEMENT Right 07/19/2018   Procedure: INSERTION OF ARTERIOVENOUS (AV) GORE-TEX GRAFT ARM ( BRACHIAL AXILLARY);  Surgeon: Algernon Huxley, MD;  Location: [facility] ORS;  Service: Vascular;  Laterality: Right;  . DIALYSIS FISTULA CREATION Right 10/2017   right chest perm cath  . DIALYSIS/PERMA CATHETER REMOVAL N/A 09/13/2018   Procedure: DIALYSIS/PERMA CATHETER REMOVAL;  Surgeon: Algernon Huxley, MD;  Location: [place] CV LAB;  Service: Cardiovascular;  Laterality: N/A;  . ESOPHAGOGASTRODUODENOSCOPY N/A 12/19/2017   Procedure: ESOPHAGOGASTRODUODENOSCOPY (EGD);  Surgeon: Lin Landsman, MD;  Location: [facility] ENDOSCOPY;  Service: Gastroenterology;  Laterality: N/A;  . LOWER EXTREMITY ANGIOGRAPHY Left 11/16/2017   Procedure: LOWER EXTREMITY ANGIOGRAPHY;  Surgeon: Algernon Huxley, MD;  Location: [place] INVASIVE CV  LAB;  Service: Cardiovascular;  Laterality: Left;  . LOWER EXTREMITY ANGIOGRAPHY Right 01/18/2018   Procedure: LOWER EXTREMITY ANGIOGRAPHY;  Surgeon: Algernon Huxley, MD;  Location: [place] CV LAB;  Service: Cardiovascular;  Laterality: Right;  . LOWER EXTREMITY ANGIOGRAPHY Left 04/02/2018   Procedure: LOWER EXTREMITY ANGIOGRAPHY;  Surgeon: Algernon Huxley, MD;  Location: [place] CV LAB;  Service: Cardiovascular;  Laterality: Left;  . LOWER EXTREMITY ANGIOGRAPHY Right 04/09/2018   Procedure: Lower Extremity  Angiography with possible intervention;  Surgeon: Algernon Huxley, MD;  Location: [place] CV LAB;  Service: Cardiovascular;  Laterality: Right;  . LOWER EXTREMITY ANGIOGRAPHY Right 07/23/2018   Procedure: Lower Extremity Angiography;  Surgeon: Algernon Huxley, MD;  Location: [place] CV LAB;  Service: Cardiovascular;  Laterality: Right;  . LOWER EXTREMITY ANGIOGRAPHY Right 09/13/2018   Procedure: LOWER EXTREMITY ANGIOGRAPHY;  Surgeon: Algernon Huxley, MD;  Location: [place] CV LAB;  Service: Cardiovascular;  Laterality: Right;  . LOWER EXTREMITY VENOGRAPHY Right 09/13/2018   Procedure: LOWER EXTREMITY VENOGRAPHY;  Surgeon: Algernon Huxley, MD;  Location: [place] CV LAB;  Service: Cardiovascular;  Laterality: Right;  . PERIPHERAL VASCULAR CATHETERIZATION Left 09/01/2015   Procedure: A/V Shuntogram/Fistulagram;  Surgeon: Katha Cabal, MD;  Location: [place] CV LAB;  Service: Cardiovascular;  Laterality: Left;  . PERIPHERAL VASCULAR CATHETERIZATION N/A 09/30/2015   Procedure: A/V Shuntogram/Fistulagram with perm cathether removal;  Surgeon: Algernon Huxley, MD;  Location: [place] CV LAB;  Service: Cardiovascular;  Laterality: N/A;  . PERIPHERAL VASCULAR CATHETERIZATION Left 09/30/2015   Procedure: A/V Shunt Intervention;  Surgeon: Algernon Huxley, MD;  Location: [place] CV LAB;  Service: Cardiovascular;  Laterality: Left;  . PERIPHERAL VASCULAR CATHETERIZATION Left 12/03/2015   Procedure: Thrombectomy;  Surgeon: Algernon Huxley, MD;  Location: [place] CV LAB;  Service: Cardiovascular;  Laterality: Left;  . PERIPHERAL VASCULAR CATHETERIZATION Left 01/28/2016   Procedure: Thrombectomy;  Surgeon: Algernon Huxley, MD;  Location: [place] CV LAB;  Service: Cardiovascular;  Laterality: Left;  . PERIPHERAL VASCULAR CATHETERIZATION N/A 01/28/2016   Procedure: A/V Shuntogram/Fistulagram;  Surgeon: Algernon Huxley, MD;  Location: [place] CV LAB;  Service: Cardiovascular;  Laterality: N/A;   . SKIN SPLIT GRAFT Right 05/24/2018   Procedure: SKIN GRAFT SPLIT THICKNESS ( RIGHT CALF);  Surgeon: Algernon Huxley, MD;  Location: [facility] ORS;  Service: Vascular;  Laterality: Right;  . UPPER EXTREMITY ANGIOGRAPHY  10/24/2018   Procedure: Upper Extremity Angiography;  Surgeon: Algernon Huxley, MD;  Location: [place] CV LAB;  Service: Cardiovascular;;  . UPPER EXTREMITY ANGIOGRAPHY Right 02/14/2019   Procedure: UPPER EXTREMITY ANGIOGRAPHY;  Surgeon: Algernon Huxley, MD;  Location: [place] CV LAB;  Service: Cardiovascular;  Laterality: Right;  . WOUND DEBRIDEMENT Right 04/11/2018   Procedure: DEBRIDEMENT WOUND calf muscle and skin;  Surgeon: Algernon Huxley, MD;  Location: [facility] ORS;  Service: Vascular;  Laterality: Right;    There were no vitals filed for this visit.   Subjective Assessment - 11/26/19 0955    Subjective Patient stated ne is doing well today, no complaints, no falls/stumbles, no pain    Pertinent History Pt underwent Rt AKA amputation in June 2020 after difficulty with wound healing/infection. Pt DC hospital to SNF for rehab. He has since been back at home. Pt uses a manual WC or power chair for daily mobility. Pt takes tranportation services to HD MWF. Pt was recently fitted for his AKA prosthesis in Jan 2021.  Limitations Standing;Walking;House hold activities    How long can you sit comfortably? No difficulty    How long can you stand comfortably? 1 minute    How long can you walk comfortably? unable    Currently in Pain? No/denies           Goals updated this session: Sit to stand from [facility] with bilateral UE support and RW, CGA. Extended time needed to lock R knee.  5 times sit to stand with bilateral UE support, CGA with RW. The patient was able to perform from standard WC height, 1:09seconds. Attempt from elevated surface next session.  Gait Training Performed gait training with patient using rolling walker with CGA x 1   Minimal education today required with patient  regarding gait sequencing including advancing walker, unlocking R knee, utilizing momentum to create heel strike on the right, and to lock out right knee. CGA and wheelchair follow.  He requires some cues for upright posture.  He is able to ambulate 200' x 2 with BRW completing laps in the hallway He continues to demonstrate heavy BUE support on walker handles during gait.    Pt response/clinical impression: Pt fatigued at end of session, exhibited by poor eccentric control to WC at end of ambulation ,as well as decreased step length, and extended time needed to lock RLE knee joint. Very motivated to continue to therapy. Overall the patient continues to exhibit improvement towards goals as well as abilities with functional activities and would benefit from further skilled PT intervention.     PT Education - 11/26/19 0955    Education Details therex, gait    Person(s) Educated Patient    Methods Explanation;Demonstration;Tactile cues;Verbal cues    Comprehension Verbalized understanding;Returned demonstration;Verbal cues required;Tactile cues required;Need further instruction            PT Short Term Goals - 11/26/19 0956      PT SHORT TERM GOAL #1   Title Pt to demonstrate ability to perfrom STS transfer from chair c RW minGuard assist and lock his RLE knee joint without assist.    Baseline ModA from elevated surface at eval; 08/29/19: minA+1 with BUE support from regular height chair with arm rests; still requires elevated surface to perform. 7/20 able to perform STS from regular WC with RW and CGA, extended time needed to lock R knee joint and CGA for initialy steadying. highly reliant on UE    Time 4    Period Weeks    Status On-going    Target Date 12/24/19      PT SHORT TERM GOAL #2   Title Pt to demonstrate 5 degrees hip flexion 10 degrees hip abdct ROM.    Baseline lacks 5 degrees from neutral in both at eval; At visit 10, has 0 degrees flexion/extension; still lacking 5  degrees ABDCT neutral; 08/29/19: R hip: lacking 5 degrees of extension, 15 degrees abduction    Time 4    Period Weeks    Status Achieved    Target Date 06/25/19             PT Long Term Goals - 11/26/19 0957      PT LONG TERM GOAL #1   Title Pt to demonstrates 15 degrees Rt hip ABDCT, 10 degrees Rt hip extension P/ROM to facilitate prosthesis motor control.    Baseline 08/29/19: R hip: lacking 5 degrees of extension, 15 degrees abduction, 10/15/19= R hip lacking 10 deg extension; 7/20 goal deferred to next session  Time 8    Period Weeks    Status On-going    Target Date 01/21/20      PT LONG TERM GOAL #2   Title Pt to demonstrate 5/5 strength hip extension at 0 degrees flexion/extension    Baseline has 5/5 in some ranges, but not in ranges specific to standing or gait.; 08/29/19: Unable to get to neutral flexion extension but 5/5 R hip extension in other ranges, 10/15/19= -3/5 right hip extension; 7/20 deferred to next session    Time 8    Period Weeks    Status On-going    Target Date 01/21/20      PT LONG TERM GOAL #3   Title Pt able to perfrom 5x STS from chair with RW, arms ad lib in <21sec    Baseline On 6/22 c RW and chair + 1 airex pad: 22.95sec (largely avoids use of RLE for power); 7/20, able to perform from standard WC height, 1:09seconds with BUE use.    Time 12    Period Weeks    Status On-going    Target Date 01/21/20      PT LONG TERM GOAL #4   Title Pt will be able to ambulate at least 100' with RW and supervision with proper locking of R prosthesis in order to demonstrate safe household ambulation in order to improve independence with gait without need for power wheelchair    Baseline Tolerating distance, but speed is inapropriate for distance for energy conservation and safety. Making progress, but needs more improvement.    Time 12    Period Weeks    Status On-going    Target Date 01/21/20      PT LONG TERM GOAL #5   Title Patient will continue to maintain  a FOTO score of 52 or higher to indicate improvement from initial evaluation in functional activities.    Baseline initial score 48, on 7/20 score 55    Time 8    Period Weeks    Status New    Target Date 01/21/20                 Plan - 11/26/19 1007    Clinical Impression Statement Pt fatigued at end of session, exhibited by poor eccentric control to WC at end of ambulation ,as well as decreased step length, and extended time needed to lock RLE knee joint. Very motivated to continue to therapy. Overall the patient continues to exhibit improvement towards goals as well as abilities with functional activities and would benefit from further skilled PT intervention.    Personal Factors and Comorbidities Age;Fitness;Past/Current Experience;Education;Transportation    Examination-Activity Limitations Transfers;Dressing;Squat;Stairs;Stand;Bed Mobility    Examination-Participation Restrictions Community Activity    Stability/Clinical Decision Making Stable/Uncomplicated    Rehab Potential Good    PT Frequency 2x / week    PT Duration 12 weeks    PT Treatment/Interventions ADLs/Self Care Home Management;Cryotherapy;Electrical Stimulation;DME Instruction;Gait training;Stair training;Functional mobility training;Therapeutic activities;Therapeutic exercise;Balance training;Neuromuscular re-education;Cognitive remediation;Patient/family education;Prosthetic Training;Wheelchair mobility training;Passive range of motion;Dry needling;Scar mobilization;Spinal Manipulations;Joint Manipulations    PT Next Visit Plan Targeted Rt hip extension strengthening in both end range flexion and end range extension ranges, hip rotation strengthening, standing balance adn postural control.    PT Home Exercise Plan Medbridge Access Code: [email or web address] (prone positioning), L single leg bridges, L sidelying R hip abduction, hip isometric adduction    Consulted and Agree with Plan of Care Patient           Patient will  benefit from skilled therapeutic intervention in order to improve the following deficits and impairments:  Decreased activity tolerance, Decreased balance, Decreased mobility, Decreased strength, Decreased cognition, Decreased knowledge of use of DME, Decreased knowledge of precautions, Decreased endurance, Decreased range of motion, Difficulty walking, Decreased safety awareness, Decreased skin integrity, Decreased scar mobility, Hypomobility, Postural dysfunction, Increased edema, Increased muscle spasms, Increased fascial restricitons, Prosthetic Dependency, Impaired tone, Improper body mechanics  Visit Diagnosis: Difficulty in walking, not elsewhere classified  Unsteadiness on feet     Problem List Patient Active Problem List   Diagnosis Date Noted  . Cardiomyopathy ([place]) 01/22/2019  . CHF (congestive heart failure) ([place]) 01/22/2019  . Coronary disease 01/22/2019  . Above knee amputation of right lower extremity ([place]) 11/13/2018  . Leg ulcer, right, with fat layer exposed ([place]) 10/22/2018  . Cellulitis of right leg 07/21/2018  . Lower limb ulcer, calf, right, limited to breakdown of skin ([place]) 06/08/2018  . PVD (peripheral vascular disease) ([place]) 04/17/2018  . Malnutrition of moderate degree 04/11/2018  . Pressure injury of skin 04/06/2018  . Altered mental status 04/04/2018  . Hypothermia 04/04/2018  . Hemodialysis graft malfunction ([place]) 03/26/2018  . Hypercholesterolemia 02/27/2018  . Diabetes ([place]) 02/27/2018  . Weakness of right lower extremity 01/20/2018  . Fever   . Periumbilical abdominal pain   . Confusion 12/22/2017  . Acute delirium 12/21/2017  . Protein-calorie malnutrition, severe 12/19/2017  . Intractable nausea and vomiting 12/18/2017  . Lymphedema 12/13/2017  . Cellulitis 11/27/2017  . Chest pain 11/19/2017  . Atherosclerosis of native arteries of the extremities with ulceration ([place]) 11/07/2017  . Twitching 01/03/2017  . Elevated troponin 10/02/2015  .  Complications, dialysis, catheter, mechanical ([place]) 10/02/2015  . Musculoskeletal chest pain 09/28/2015  . Chronic diastolic CHF (congestive heart failure) ([place]) 09/28/2015  . End stage renal disease ([place]) 10/09/2012  . Hypertension 10/09/2012  . Gout 10/09/2012    Lieutenant Diego PT, DPT 10:28 AM,11/26/19    [place] MAIN [facility] SERVICES [address] [place], [place], [address] Phone: [phone]   Fax:  [phone]  Name: Johnny [name]. MRN: [id number] Date of Birth: 08-05-1950

## 2019-11-28 ENCOUNTER — Ambulatory Visit: Payer: Medicare Other

## 2019-11-28 ENCOUNTER — Other Ambulatory Visit: Payer: Self-pay

## 2019-11-28 ENCOUNTER — Encounter: Payer: Self-pay | Admitting: Physical Therapy

## 2019-11-28 DIAGNOSIS — R262 Difficulty in walking, not elsewhere classified: Secondary | ICD-10-CM | POA: Diagnosis not present

## 2019-11-28 DIAGNOSIS — R2681 Unsteadiness on feet: Secondary | ICD-10-CM

## 2019-11-28 NOTE — Therapy (Signed)
[place] [facility] SERVICES [address] [place], [place], [address] Phone: [phone]   Fax:  [phone]  Physical Therapy Treatment  Patient Details  Name: Johnny [name]. MRN: [id number] Date of Birth: 03-21-1951 Referring Provider (PT): Eulogio Ditch, NP    Encounter Date: 11/28/2019   PT End of Session - 11/28/19 0822    Visit Number 41    Number of Visits 41    Date for PT Re-Evaluation 01/21/20    Authorization Type [employer] Medicare    Authorization Time Period 10/29/19-01/21/20: FOTO by PT (assessed 7/20 score 55)    PT Start Time 0825    PT Stop Time 0910    PT Time Calculation (min) 45 min    Equipment Utilized During Treatment Gait belt    Activity Tolerance Patient tolerated treatment well;No increased pain    Behavior During Therapy WFL for tasks assessed/performed           Past Medical History:  Diagnosis Date   Anemia    Anxiety    CHF (congestive heart failure) ([place])    Chronic kidney disease    esrd dialysis m/w/f   Gout    Hyperlipidemia    Hypertension    Myocardial infarction ([place]) 2010   10 years ago   Neuromuscular disorder ([place]) 2020   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   Peripheral vascular disease [facility])     Past Surgical History:  Procedure Laterality Date   A/V FISTULAGRAM Right 09/06/2018   Procedure: A/V FISTULAGRAM;  Surgeon: Algernon Huxley, MD;  Location: [place] [facility];  Service: Cardiovascular;  Laterality: Right;   A/V SHUNTOGRAM Left 06/21/2017   Procedure: A/V SHUNTOGRAM;  Surgeon: Katha Cabal, MD;  Location: [place] [facility];  Service: Cardiovascular;  Laterality: Left;   A/V SHUNTOGRAM N/A 10/24/2018   Procedure: A/V SHUNTOGRAM;  Surgeon: Algernon Huxley, MD;  Location: [place] [facility];  Service: Cardiovascular;  Laterality: N/A;   ABOVE KNEE LEG AMPUTATION Right 2020   AMPUTATION Right 10/25/2018   Procedure: AMPUTATION ABOVE KNEE;  Surgeon: Algernon Huxley, MD;  Location: [facility] OR[facility];  Service: General;  Laterality: Right;   APPLICATION OF WOUND VAC Right 04/11/2018   Procedure: APPLICATION OF WOUND VAC;  Surgeon: Algernon Huxley, MD;  Location: [facility] OR[facility];  Service: Vascular;  Laterality: Right;   AV FISTULA PLACEMENT Left 09/18/2015   Procedure: INSERTION OF ARTERIOVENOUS (AV) GORE-TEX GRAFT ARM ( BRACH/AXILLARY GRAFT W/ INSTANT STICK GRAFT );  Surgeon: Katha Cabal, MD;  Location: [facility];  Service: Vascular;  Laterality: Left;   AV FISTULA PLACEMENT Right 07/19/2018   Procedure: INSERTION OF ARTERIOVENOUS (AV) GORE-TEX GRAFT ARM ( BRACHIAL AXILLARY);  Surgeon: Algernon Huxley, MD;  Location: [facility];  Service: Vascular;  Laterality: Right;   DIALYSIS FISTULA CREATION Right 10/2017   right chest perm cath   DIALYSIS/PERMA CATHETER REMOVAL N/A 09/13/2018   Procedure: DIALYSIS/PERMA CATHETER REMOVAL;  Surgeon: Algernon Huxley, MD;  Location: [place] [facility];  Service: Cardiovascular;  Laterality: N/A;   ESOPHAGOGASTRODUODENOSCOPY N/A 12/19/2017   Procedure: ESOPHAGOGASTRODUODENOSCOPY (EGD);  Surgeon: Lin Landsman, MD;  Location: [facility] ENDOSCOPY;  Service: Gastroenterology;  Laterality: N/A;   LOWER EXTREMITY ANGIOGRAPHY Left 11/16/2017   Procedure: LOWER EXTREMITY ANGIOGRAPHY;  Surgeon: Algernon Huxley, MD;  Location: [place] [facility];  Service: Cardiovascular;  Laterality: Left;   LOWER EXTREMITY ANGIOGRAPHY Right 01/18/2018   Procedure: LOWER EXTREMITY ANGIOGRAPHY;  Surgeon:  Algernon Huxley, MD;  Location: [place] [facility];  Service: Cardiovascular;  Laterality: Right;   LOWER EXTREMITY ANGIOGRAPHY Left 04/02/2018   Procedure: LOWER EXTREMITY ANGIOGRAPHY;  Surgeon: Algernon Huxley, MD;  Location: [place] [facility];  Service: Cardiovascular;  Laterality: Left;   LOWER EXTREMITY ANGIOGRAPHY Right 04/09/2018   Procedure: Lower Extremity Angiography with possible intervention;  Surgeon: Algernon Huxley, MD;  Location: [place] [facility];   Service: Cardiovascular;  Laterality: Right;   LOWER EXTREMITY ANGIOGRAPHY Right 07/23/2018   Procedure: Lower Extremity Angiography;  Surgeon: Algernon Huxley, MD;  Location: [place] [facility];  Service: Cardiovascular;  Laterality: Right;   LOWER EXTREMITY ANGIOGRAPHY Right 09/13/2018   Procedure: LOWER EXTREMITY ANGIOGRAPHY;  Surgeon: Algernon Huxley, MD;  Location: [place] [facility];  Service: Cardiovascular;  Laterality: Right;   LOWER EXTREMITY VENOGRAPHY Right 09/13/2018   Procedure: LOWER EXTREMITY VENOGRAPHY;  Surgeon: Algernon Huxley, MD;  Location: [place] [facility];  Service: Cardiovascular;  Laterality: Right;   PERIPHERAL VASCULAR CATHETERIZATION Left 09/01/2015   Procedure: A/V Shuntogram/Fistulagram;  Surgeon: Katha Cabal, MD;  Location: [place] [facility];  Service: Cardiovascular;  Laterality: Left;   PERIPHERAL VASCULAR CATHETERIZATION N/A 09/30/2015   Procedure: A/V Shuntogram/Fistulagram with perm cathether removal;  Surgeon: Algernon Huxley, MD;  Location: [place] [facility];  Service: Cardiovascular;  Laterality: N/A;   PERIPHERAL VASCULAR CATHETERIZATION Left 09/30/2015   Procedure: A/V Shunt Intervention;  Surgeon: Algernon Huxley, MD;  Location: [place] [facility];  Service: Cardiovascular;  Laterality: Left;   PERIPHERAL VASCULAR CATHETERIZATION Left 12/03/2015   Procedure: Thrombectomy;  Surgeon: Algernon Huxley, MD;  Location: [place] [facility];  Service: Cardiovascular;  Laterality: Left;   PERIPHERAL VASCULAR CATHETERIZATION Left 01/28/2016   Procedure: Thrombectomy;  Surgeon: Algernon Huxley, MD;  Location: [place] [facility];  Service: Cardiovascular;  Laterality: Left;   PERIPHERAL VASCULAR CATHETERIZATION N/A 01/28/2016   Procedure: A/V Shuntogram/Fistulagram;  Surgeon: Algernon Huxley, MD;  Location: [place] [facility];  Service: Cardiovascular;  Laterality: N/A;   SKIN SPLIT GRAFT Right 05/24/2018   Procedure: SKIN GRAFT SPLIT THICKNESS ( RIGHT CALF);  Surgeon:  Algernon Huxley, MD;  Location: [facility];  Service: Vascular;  Laterality: Right;   UPPER EXTREMITY ANGIOGRAPHY  10/24/2018   Procedure: Upper Extremity Angiography;  Surgeon: Algernon Huxley, MD;  Location: [place] [facility];  Service: Cardiovascular;;   UPPER EXTREMITY ANGIOGRAPHY Right 02/14/2019   Procedure: UPPER EXTREMITY ANGIOGRAPHY;  Surgeon: Algernon Huxley, MD;  Location: [place] [facility];  Service: Cardiovascular;  Laterality: Right;   WOUND DEBRIDEMENT Right 04/11/2018   Procedure: DEBRIDEMENT WOUND calf muscle and skin;  Surgeon: Algernon Huxley, MD;  Location: [facility];  Service: Vascular;  Laterality: Right;    There were no vitals filed for this visit.   Subjective Assessment - 11/28/19 0821    Subjective Patient stated that he is good today, no complaints.    Pertinent History Pt underwent Rt AKA amputation in June 2020 after difficulty with wound healing/infection. Pt DC hospital to SNF for rehab. He has since been back at home. Pt uses a manual WC or power chair for daily mobility. Pt takes tranportation services to HD MWF. Pt was recently fitted for his AKA prosthesis in Jan 2021.    Limitations Standing;Walking;House hold activities    How long can you sit comfortably? No difficulty    How long can you stand  comfortably? 1 minute    How long can you walk comfortably? unable    Currently in Pain? No/denies            TREATMENT:   Therex: nustep level 2 seat 11, hands 11. Pt cued for SPM, activity performed for activity tolerance/endurance. X 6 minutes Seated march x10 Seated march with PT resistance 3x10 Seated hip extension resisted by PT  3x10  10 MWT: 1:01seconds : .16 m/s   5 times sit to stand 17 seconds from elevated surface  x10 sit to stands from standard wheelchair, cues for hand placement occasionally. Pt fatigued at end.   Gait Training Performed gait training with patient using rolling walker with CGA x 1   Minimal education today required with  patient regarding gait sequencing including advancing walker, unlocking R knee, utilizing momentum to create heel strike on the right, and to lock out right knee. CGA and wheelchair follow.  He requires some cues for upright posture.   He is able to ambulate 220' x 2 with BRW completing laps in the hallway He continues to demonstrate heavy BUE support on walker handles during gait.      Pt response/clinical impression: Pt demonstrated excellent motivation and tolerance to therapy. Two instances of PT reminders to unlock knee prior to sitting (stand pivot to nustep, and when pt fatigued during sit to stands). No instances of LOB during ambulation today. 10 MWT performed, gait velocity of .3m/s of self selected speed. The patient would benefit from further skilled PT intervention to continue to progress towards goals.        PT Education - 11/28/19 7824    Education Details therex/ gait    Person(s) Educated Patient    Methods Explanation;Demonstration;Tactile cues;Verbal cues    Comprehension Verbalized understanding;Returned demonstration;Verbal cues required;Tactile cues required;Need further instruction            PT Short Term Goals - 11/26/19 0956      PT SHORT TERM GOAL #1   Title Pt to demonstrate ability to perfrom STS transfer from chair c RW minGuard assist and lock his RLE knee joint without assist.    Baseline ModA from elevated surface at eval; 08/29/19: minA+1 with BUE support from regular height chair with arm rests; still requires elevated surface to perform. 7/20 able to perform STS from regular WC with RW and CGA, extended time needed to lock R knee joint and CGA for initialy steadying. highly reliant on UE    Time 4    Period Weeks    Status On-going    Target Date 12/24/19      PT SHORT TERM GOAL #2   Title Pt to demonstrate 5 degrees hip flexion 10 degrees hip abdct ROM.    Baseline lacks 5 degrees from neutral in both at eval; At visit 10, has 0 degrees  flexion/extension; still lacking 5 degrees ABDCT neutral; 08/29/19: R hip: lacking 5 degrees of extension, 15 degrees abduction    Time 4    Period Weeks    Status Achieved    Target Date 06/25/19             PT Long Term Goals - 11/26/19 0957      PT LONG TERM GOAL #1   Title Pt to demonstrates 15 degrees Rt hip ABDCT, 10 degrees Rt hip extension P/ROM to facilitate prosthesis motor control.    Baseline 08/29/19: R hip: lacking 5 degrees of extension, 15 degrees abduction, 10/15/19= R hip lacking 10  deg extension; 7/20 goal deferred to next session    Time 8    Period Weeks    Status On-going    Target Date 01/21/20      PT LONG TERM GOAL #2   Title Pt to demonstrate 5/5 strength hip extension at 0 degrees flexion/extension    Baseline has 5/5 in some ranges, but not in ranges specific to standing or gait.; 08/29/19: Unable to get to neutral flexion extension but 5/5 R hip extension in other ranges, 10/15/19= -3/5 right hip extension; 7/20 deferred to next session    Time 8    Period Weeks    Status On-going    Target Date 01/21/20      PT LONG TERM GOAL #3   Title Pt able to perfrom 5x STS from chair with RW, arms ad lib in <21sec    Baseline On 6/22 c RW and chair + 1 airex pad: 22.95sec (largely avoids use of RLE for power); 7/20, able to perform from standard WC height, 1:09seconds with BUE use.    Time 12    Period Weeks    Status On-going    Target Date 01/21/20      PT LONG TERM GOAL #4   Title Pt will be able to ambulate at least 100' with RW and supervision with proper locking of R prosthesis in order to demonstrate safe household ambulation in order to improve independence with gait without need for power wheelchair    Baseline Tolerating distance, but speed is inapropriate for distance for energy conservation and safety. Making progress, but needs more improvement.    Time 12    Period Weeks    Status On-going    Target Date 01/21/20      PT LONG TERM GOAL #5    Title Patient will continue to maintain a FOTO score of 52 or higher to indicate improvement from initial evaluation in functional activities.    Baseline initial score 48, on 7/20 score 55    Time 8    Period Weeks    Status New    Target Date 01/21/20                 Plan - 11/28/19 9562    Clinical Impression Statement Pt demonstrated excellent motivation and tolerance to therapy. Two instances of PT reminders to unlock knee prior to sitting (stand pivot to nustep, and when pt fatigued during sit to stands). No instances of LOB during ambulation today. 10 MWT performed, gait velocity of .75m/s of self selected speed. The patient would benefit from further skilled PT intervention to continue to progress towards goals.    Personal Factors and Comorbidities Age;Fitness;Past/Current Experience;Education;Transportation    Examination-Activity Limitations Transfers;Dressing;Squat;Stairs;Stand;Bed Mobility    Examination-Participation Restrictions Community Activity    Stability/Clinical Decision Making Stable/Uncomplicated    Rehab Potential Good    PT Frequency 2x / week    PT Duration 12 weeks    PT Treatment/Interventions ADLs/Self Care Home Management;Cryotherapy;Electrical Stimulation;DME Instruction;Gait training;Stair training;Functional mobility training;Therapeutic activities;Therapeutic exercise;Balance training;Neuromuscular re-education;Cognitive remediation;Patient/family education;Prosthetic Training;Wheelchair mobility training;Passive range of motion;Dry needling;Scar mobilization;Spinal Manipulations;Joint Manipulations    PT Next Visit Plan Targeted Rt hip extension strengthening in both end range flexion and end range extension ranges, hip rotation strengthening, standing balance adn postural control.    PT Home Exercise Plan Medbridge Access Code: [email or web address] (prone positioning), L single leg bridges, L sidelying R hip abduction, hip isometric adduction    Consulted and  Agree with Plan of  Care Patient           Patient will benefit from skilled therapeutic intervention in order to improve the following deficits and impairments:  Decreased activity tolerance, Decreased balance, Decreased mobility, Decreased strength, Decreased cognition, Decreased knowledge of use of DME, Decreased knowledge of precautions, Decreased endurance, Decreased range of motion, Difficulty walking, Decreased safety awareness, Decreased skin integrity, Decreased scar mobility, Hypomobility, Postural dysfunction, Increased edema, Increased muscle spasms, Increased fascial restricitons, Prosthetic Dependency, Impaired tone, Improper body mechanics  Visit Diagnosis: Difficulty in walking, not elsewhere classified  Unsteadiness on feet     Problem List Patient Active Problem List   Diagnosis Date Noted   Cardiomyopathy ([place]) 01/22/2019   CHF (congestive heart failure) ([place]) 01/22/2019   Coronary disease 01/22/2019   Above knee amputation of right lower extremity ([place]) 11/13/2018   Leg ulcer, right, with fat layer exposed ([place]) 10/22/2018   Cellulitis of right leg 07/21/2018   Lower limb ulcer, calf, right, limited to breakdown of skin ([place]) 06/08/2018   PVD (peripheral vascular disease) ([place]) 04/17/2018   Malnutrition of moderate degree 04/11/2018   Pressure injury of skin 04/06/2018   Altered mental status 04/04/2018   Hypothermia 04/04/2018   Hemodialysis graft malfunction ([place]) 03/26/2018   Hypercholesterolemia 02/27/2018   Diabetes ([place]) 02/27/2018   Weakness of right lower extremity 01/20/2018   Fever    Periumbilical abdominal pain    Confusion 12/22/2017   Acute delirium 12/21/2017   Protein-calorie malnutrition, severe 12/19/2017   Intractable nausea and vomiting 12/18/2017   Lymphedema 12/13/2017   Cellulitis 11/27/2017   Chest pain 11/19/2017   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 [place], [place], [address] Phone: [phone]   Fax:  [phone]  Name: Sevon Rotert. MRN: [id number] Date of Birth: November 27, 1950

## 2019-12-03 ENCOUNTER — Encounter: Payer: Self-pay | Admitting: Physical Therapy

## 2019-12-03 ENCOUNTER — Ambulatory Visit: Payer: Medicare Other | Admitting: Physical Therapy

## 2019-12-03 ENCOUNTER — Other Ambulatory Visit: Payer: Self-pay

## 2019-12-03 DIAGNOSIS — R2681 Unsteadiness on feet: Secondary | ICD-10-CM

## 2019-12-03 DIAGNOSIS — R262 Difficulty in walking, not elsewhere classified: Secondary | ICD-10-CM | POA: Diagnosis not present

## 2019-12-03 NOTE — Therapy (Signed)
[place] MAIN [facility] SERVICES [address] [place], [place], [address] Phone: [phone]   Fax:  [phone]  Physical Therapy Treatment  Patient Details  Name: Johnny [name]. MRN: [id number] Date of Birth: 02/06/1951 Referring Provider (PT): Eulogio Ditch, NP    Encounter Date: 12/03/2019   PT End of Session - 12/03/19 0859    Visit Number 42    Number of Visits 41    Date for PT Re-Evaluation 01/21/20    Authorization Type [employer] Medicare    Authorization Time Period 10/29/19-01/21/20: FOTO by PT (assessed 7/20 score 55)    PT Start Time 0900    PT Stop Time 0955    PT Time Calculation (min) 55 min    Equipment Utilized During Treatment Gait belt    Activity Tolerance Patient tolerated treatment well;No increased pain    Behavior During Therapy WFL for tasks assessed/performed           Past Medical History:  Diagnosis Date  . Anemia   . Anxiety   . CHF (congestive heart failure) ([place])   . Chronic kidney disease    esrd dialysis m/w/f  . Gout   . Hyperlipidemia   . Hypertension   . Myocardial infarction ([place]) 2010   10 years ago  . 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  . Peripheral vascular disease [facility])     Past Surgical History:  Procedure Laterality Date  . A/V FISTULAGRAM Right 09/06/2018   Procedure: A/V FISTULAGRAM;  Surgeon: Algernon Huxley, MD;  Location: [place] CV LAB;  Service: Cardiovascular;  Laterality: Right;  . A/V SHUNTOGRAM Left 06/21/2017   Procedure: A/V SHUNTOGRAM;  Surgeon: Katha Cabal, MD;  Location: [place] CV LAB;  Service: Cardiovascular;  Laterality: Left;  . A/V SHUNTOGRAM N/A 10/24/2018   Procedure: A/V SHUNTOGRAM;  Surgeon: Algernon Huxley, MD;  Location: [place] CV LAB;  Service: Cardiovascular;  Laterality: N/A;  . ABOVE KNEE LEG AMPUTATION Right 2020  . AMPUTATION Right 10/25/2018   Procedure: AMPUTATION ABOVE KNEE;  Surgeon: Algernon Huxley, MD;  Location: [facility] ORS;  Service: General;  Laterality: Right;  . APPLICATION OF WOUND VAC Right 04/11/2018   Procedure: APPLICATION OF WOUND VAC;  Surgeon: Algernon Huxley, MD;  Location: [facility] ORS;  Service: Vascular;  Laterality: Right;  . AV FISTULA PLACEMENT Left 09/18/2015   Procedure: INSERTION OF ARTERIOVENOUS (AV) GORE-TEX GRAFT ARM ( BRACH/AXILLARY GRAFT W/ INSTANT STICK GRAFT );  Surgeon: Katha Cabal, MD;  Location: [facility] ORS;  Service: Vascular;  Laterality: Left;  . AV FISTULA PLACEMENT Right 07/19/2018   Procedure: INSERTION OF ARTERIOVENOUS (AV) GORE-TEX GRAFT ARM ( BRACHIAL AXILLARY);  Surgeon: Algernon Huxley, MD;  Location: [facility] ORS;  Service: Vascular;  Laterality: Right;  . DIALYSIS FISTULA CREATION Right 10/2017   right chest perm cath  . DIALYSIS/PERMA CATHETER REMOVAL N/A 09/13/2018   Procedure: DIALYSIS/PERMA CATHETER REMOVAL;  Surgeon: Algernon Huxley, MD;  Location: [place] CV LAB;  Service: Cardiovascular;  Laterality: N/A;  . ESOPHAGOGASTRODUODENOSCOPY N/A 12/19/2017   Procedure: ESOPHAGOGASTRODUODENOSCOPY (EGD);  Surgeon: Lin Landsman, MD;  Location: [facility] ENDOSCOPY;  Service: Gastroenterology;  Laterality: N/A;  . LOWER EXTREMITY ANGIOGRAPHY Left 11/16/2017   Procedure: LOWER EXTREMITY ANGIOGRAPHY;  Surgeon: Algernon Huxley, MD;  Location: [place] CV LAB;  Service: Cardiovascular;  Laterality: Left;  . LOWER EXTREMITY ANGIOGRAPHY Right 01/18/2018   Procedure: LOWER EXTREMITY ANGIOGRAPHY;  Surgeon:  Algernon Huxley, MD;  Location: [place] CV LAB;  Service: Cardiovascular;  Laterality: Right;  . LOWER EXTREMITY ANGIOGRAPHY Left 04/02/2018   Procedure: LOWER EXTREMITY ANGIOGRAPHY;  Surgeon: Algernon Huxley, MD;  Location: [place] CV LAB;  Service: Cardiovascular;  Laterality: Left;  . LOWER EXTREMITY ANGIOGRAPHY Right 04/09/2018   Procedure: Lower Extremity Angiography with possible intervention;  Surgeon: Algernon Huxley, MD;  Location: [place] CV LAB;   Service: Cardiovascular;  Laterality: Right;  . LOWER EXTREMITY ANGIOGRAPHY Right 07/23/2018   Procedure: Lower Extremity Angiography;  Surgeon: Algernon Huxley, MD;  Location: [place] CV LAB;  Service: Cardiovascular;  Laterality: Right;  . LOWER EXTREMITY ANGIOGRAPHY Right 09/13/2018   Procedure: LOWER EXTREMITY ANGIOGRAPHY;  Surgeon: Algernon Huxley, MD;  Location: [place] [facility] LAB;  Service: Cardiovascular;  Laterality: Right;  . LOWER EXTREMITY VENOGRAPHY Right 09/13/2018   Procedure: LOWER EXTREMITY VENOGRAPHY;  Surgeon: Algernon Huxley, MD;  Location: [place] [facility] LAB;  Service: Cardiovascular;  Laterality: Right;  . PERIPHERAL VASCULAR CATHETERIZATION Left 09/01/2015   Procedure: A/V Shuntogram/Fistulagram;  Surgeon: Katha Cabal, MD;  Location: [place] [facility];  Service: Cardiovascular;  Laterality: Left;  . PERIPHERAL VASCULAR CATHETERIZATION N/A 09/30/2015   Procedure: A/V Shuntogram/Fistulagram with perm cathether removal;  Surgeon: Algernon Huxley, MD;  Location: [place] [facility] LAB;  Service: Cardiovascular;  Laterality: N/A;  . PERIPHERAL VASCULAR CATHETERIZATION Left 09/30/2015   Procedure: A/V Shunt Intervention;  Surgeon: Algernon Huxley, MD;  Location: [place] [facility] LAB;  Service: Cardiovascular;  Laterality: Left;  . PERIPHERAL VASCULAR CATHETERIZATION Left 12/03/2015   Procedure: Thrombectomy;  Surgeon: Algernon Huxley, MD;  Location: [place] [facility] LAB;  Service: Cardiovascular;  Laterality: Left;  . PERIPHERAL VASCULAR CATHETERIZATION Left 01/28/2016   Procedure: Thrombectomy;  Surgeon: Algernon Huxley, MD;  Location: [place] [facility] LAB;  Service: Cardiovascular;  Laterality: Left;  . PERIPHERAL VASCULAR CATHETERIZATION N/A 01/28/2016   Procedure: A/V Shuntogram/Fistulagram;  Surgeon: Algernon Huxley, MD;  Location: [place] [facility] LAB;  Service: Cardiovascular;  Laterality: N/A;  . SKIN SPLIT GRAFT Right 05/24/2018   Procedure: SKIN GRAFT SPLIT THICKNESS ( RIGHT CALF);  Surgeon:  Algernon Huxley, MD;  Location: [facility] ORS;  Service: Vascular;  Laterality: Right;  . UPPER EXTREMITY ANGIOGRAPHY  10/24/2018   Procedure: Upper Extremity Angiography;  Surgeon: Algernon Huxley, MD;  Location: [place] CV LAB;  Service: Cardiovascular;;  . UPPER EXTREMITY ANGIOGRAPHY Right 02/14/2019   Procedure: UPPER EXTREMITY ANGIOGRAPHY;  Surgeon: Algernon Huxley, MD;  Location: [place] [facility];  Service: Cardiovascular;  Laterality: Right;  . WOUND DEBRIDEMENT Right 04/11/2018   Procedure: DEBRIDEMENT WOUND calf muscle and skin;  Surgeon: Algernon Huxley, MD;  Location: [facility] ORS;  Service: Vascular;  Laterality: Right;    There were no vitals filed for this visit.   Subjective Assessment - 12/03/19 0904    Subjective Patient stated that he is good today, no complaints.    Pertinent History Pt underwent Rt AKA amputation in June 2020 after difficulty with wound healing/infection. Pt DC hospital to SNF for rehab. He has since been back at home. Pt uses a manual WC or power chair for daily mobility. Pt takes tranportation services to HD MWF. Pt was recently fitted for his AKA prosthesis in Jan 2021.    Limitations Standing;Walking;House hold activities    How long can you sit comfortably? No difficulty    How long can you stand  comfortably? 1 minute    How long can you walk comfortably? unable           Treatment: Gait training with RW 100 feet x 3 with cGA and RLE having difficulty with IR Transfer training sit to stand from power wc <> nu-step with cGA Therapeutic exercise: Nu-step x 5 mins L3  Supine: SLR x 15 BLE Hookling marching x 15  Hooklying abd/ER x 15  Bridging x 15 SAQ x 15 LLE  Sidelying: Hip abd x 15 , BLE   Patient performed with instruction, verbal cues, tactile cues of therapist: goal: increase tissue extensibility, promote proper posture, improve mobility                         PT Education - 12/03/19 0859    Education Details gait and  safety    Person(s) Educated Patient    Methods Explanation    Comprehension Returned demonstration;Need further instruction            PT Short Term Goals - 11/26/19 0956      PT SHORT TERM GOAL #1   Title Pt to demonstrate ability to perfrom STS transfer from chair c RW minGuard assist and lock his RLE knee joint without assist.    Baseline ModA from elevated surface at eval; 08/29/19: minA+1 with BUE support from regular height chair with arm rests; still requires elevated surface to perform. 7/20 able to perform STS from regular WC with RW and CGA, extended time needed to lock R knee joint and CGA for initialy steadying. highly reliant on UE    Time 4    Period Weeks    Status On-going    Target Date 12/24/19      PT SHORT TERM GOAL #2   Title Pt to demonstrate 5 degrees hip flexion 10 degrees hip abdct ROM.    Baseline lacks 5 degrees from neutral in both at eval; At visit 10, has 0 degrees flexion/extension; still lacking 5 degrees ABDCT neutral; 08/29/19: R hip: lacking 5 degrees of extension, 15 degrees abduction    Time 4    Period Weeks    Status Achieved    Target Date 06/25/19             PT Long Term Goals - 11/26/19 0957      PT LONG TERM GOAL #1   Title Pt to demonstrates 15 degrees Rt hip ABDCT, 10 degrees Rt hip extension P/ROM to facilitate prosthesis motor control.    Baseline 08/29/19: R hip: lacking 5 degrees of extension, 15 degrees abduction, 10/15/19= R hip lacking 10 deg extension; 7/20 goal deferred to next session    Time 8    Period Weeks    Status On-going    Target Date 01/21/20      PT LONG TERM GOAL #2   Title Pt to demonstrate 5/5 strength hip extension at 0 degrees flexion/extension    Baseline has 5/5 in some ranges, but not in ranges specific to standing or gait.; 08/29/19: Unable to get to neutral flexion extension but 5/5 R hip extension in other ranges, 10/15/19= -3/5 right hip extension; 7/20 deferred to next session    Time 8    Period  Weeks    Status On-going    Target Date 01/21/20      PT LONG TERM GOAL #3   Title Pt able to perfrom 5x STS from chair with RW, arms ad lib in <21sec  Baseline On 6/22 c RW and chair + 1 airex pad: 22.95sec (largely avoids use of RLE for power); 7/20, able to perform from standard WC height, 1:09seconds with BUE use.    Time 12    Period Weeks    Status On-going    Target Date 01/21/20      PT LONG TERM GOAL #4   Title Pt will be able to ambulate at least 100' with RW and supervision with proper locking of R prosthesis in order to demonstrate safe household ambulation in order to improve independence with gait without need for power wheelchair    Baseline Tolerating distance, but speed is inapropriate for distance for energy conservation and safety. Making progress, but needs more improvement.    Time 12    Period Weeks    Status On-going    Target Date 01/21/20      PT LONG TERM GOAL #5   Title Patient will continue to maintain a FOTO score of 52 or higher to indicate improvement from initial evaluation in functional activities.    Baseline initial score 48, on 7/20 score 55    Time 8    Period Weeks    Status New    Target Date 01/21/20                 Plan - 12/03/19 0859    Clinical Impression Statement Patient required min verbal cueing during gait training and strengthening exercises,. Patient required occasional rest breaks between exercises due to fatigue. Patient tolerated exercise well. Patient will continue to benefit from skilled therapy in order to improve dynamic standing balance activities and increase gait speed to reduce risk for falls   Personal Factors and Comorbidities Age;Fitness;Past/Current Experience;Education;Transportation    Examination-Activity Limitations Transfers;Dressing;Squat;Stairs;Stand;Bed Mobility    Examination-Participation Restrictions Community Activity    Stability/Clinical Decision Making Stable/Uncomplicated    Rehab Potential  Good    PT Frequency 2x / week    PT Duration 12 weeks    PT Treatment/Interventions ADLs/Self Care Home Management;Cryotherapy;Electrical Stimulation;DME Instruction;Gait training;Stair training;Functional mobility training;Therapeutic activities;Therapeutic exercise;Balance training;Neuromuscular re-education;Cognitive remediation;Patient/family education;Prosthetic Training;Wheelchair mobility training;Passive range of motion;Dry needling;Scar mobilization;Spinal Manipulations;Joint Manipulations    PT Next Visit Plan Targeted Rt hip extension strengthening in both end range flexion and end range extension ranges, hip rotation strengthening, standing balance adn postural control.    PT Home Exercise Plan Medbridge Access Code: [email or web address] (prone positioning), L single leg bridges, L sidelying R hip abduction, hip isometric adduction    Consulted and Agree with Plan of Care Patient           Patient will benefit from skilled therapeutic intervention in order to improve the following deficits and impairments:  Decreased activity tolerance, Decreased balance, Decreased mobility, Decreased strength, Decreased cognition, Decreased knowledge of use of DME, Decreased knowledge of precautions, Decreased endurance, Decreased range of motion, Difficulty walking, Decreased safety awareness, Decreased skin integrity, Decreased scar mobility, Hypomobility, Postural dysfunction, Increased edema, Increased muscle spasms, Increased fascial restricitons, Prosthetic Dependency, Impaired tone, Improper body mechanics  Visit Diagnosis: Difficulty in walking, not elsewhere classified  Unsteadiness on feet     Problem List Patient Active Problem List   Diagnosis Date Noted  . Cardiomyopathy ([place]) 01/22/2019  . CHF (congestive heart failure) ([place]) 01/22/2019  . Coronary disease 01/22/2019  . Above knee amputation of right lower extremity ([place]) 11/13/2018  . Leg ulcer, right, with fat layer exposed ([place])  10/22/2018  . Cellulitis of right leg 07/21/2018  . Lower limb  ulcer, calf, right, limited to breakdown of skin ([place]) 06/08/2018  . PVD (peripheral vascular disease) ([place]) 04/17/2018  . Malnutrition of moderate degree 04/11/2018  . Pressure injury of skin 04/06/2018  . Altered mental status 04/04/2018  . Hypothermia 04/04/2018  . Hemodialysis graft malfunction ([place]) 03/26/2018  . Hypercholesterolemia 02/27/2018  . Diabetes ([place]) 02/27/2018  . Weakness of right lower extremity 01/20/2018  . Fever   . Periumbilical abdominal pain   . Confusion 12/22/2017  . Acute delirium 12/21/2017  . Protein-calorie malnutrition, severe 12/19/2017  . Intractable nausea and vomiting 12/18/2017  . Lymphedema 12/13/2017  . Cellulitis 11/27/2017  . Chest pain 11/19/2017  . Atherosclerosis of native arteries of the extremities with ulceration ([place]) 11/07/2017  . Twitching 01/03/2017  . Elevated troponin 10/02/2015  . Complications, dialysis, catheter, mechanical ([place]) 10/02/2015  . Musculoskeletal chest pain 09/28/2015  . Chronic diastolic CHF (congestive heart failure) ([place]) 09/28/2015  . End stage renal disease ([place]) 10/09/2012  . Hypertension 10/09/2012  . Gout 10/09/2012    Alanson Puls, PT DPT 12/03/2019, 9:55 AM  [place] MAIN [facility] SERVICES [address] [place], [place], [address] Phone: [phone]   Fax:  [phone]  Name: Mekel Haverstock. MRN: [id number] Date of Birth: June 25, 1950

## 2019-12-05 ENCOUNTER — Ambulatory Visit: Payer: Medicare Other | Admitting: Physical Therapy

## 2019-12-05 ENCOUNTER — Other Ambulatory Visit: Payer: Self-pay

## 2019-12-05 DIAGNOSIS — R2681 Unsteadiness on feet: Secondary | ICD-10-CM

## 2019-12-05 DIAGNOSIS — R262 Difficulty in walking, not elsewhere classified: Secondary | ICD-10-CM

## 2019-12-05 NOTE — Therapy (Signed)
[place] MAIN [facility] SERVICES [address] [place], [place], [address] Phone: [phone]   Fax:  [phone]  Physical Therapy Treatment  Patient Details  Name: Johnny [name]. MRN: [id number] Date of Birth: 08/20/1950 Referring Provider (PT): Eulogio Ditch, NP    Encounter Date: 12/05/2019   PT End of Session - 12/05/19 0944    Visit Number 43    Number of Visits 41    Date for PT Re-Evaluation 01/21/20    Authorization Type [employer] Medicare    Authorization Time Period 10/29/19-01/21/20: FOTO by PT (assessed 7/20 score 55)    PT Start Time 0900    PT Stop Time 1000    PT Time Calculation (min) 60 min    Equipment Utilized During Treatment Gait belt    Activity Tolerance Patient tolerated treatment well;No increased pain    Behavior During Therapy WFL for tasks assessed/performed           Past Medical History:  Diagnosis Date  . Anemia   . Anxiety   . CHF (congestive heart failure) ([place])   . Chronic kidney disease    esrd dialysis m/w/f  . Gout   . Hyperlipidemia   . Hypertension   . Myocardial infarction ([place]) 2010   10 years ago  . 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  . Peripheral vascular disease [facility])     Past Surgical History:  Procedure Laterality Date  . A/V FISTULAGRAM Right 09/06/2018   Procedure: A/V FISTULAGRAM;  Surgeon: Algernon Huxley, MD;  Location: [place] CV LAB;  Service: Cardiovascular;  Laterality: Right;  . A/V SHUNTOGRAM Left 06/21/2017   Procedure: A/V SHUNTOGRAM;  Surgeon: Katha Cabal, MD;  Location: [place] CV LAB;  Service: Cardiovascular;  Laterality: Left;  . A/V SHUNTOGRAM N/A 10/24/2018   Procedure: A/V SHUNTOGRAM;  Surgeon: Algernon Huxley, MD;  Location: [place] CV LAB;  Service: Cardiovascular;  Laterality: N/A;  . ABOVE KNEE LEG AMPUTATION Right 2020  . AMPUTATION Right 10/25/2018   Procedure: AMPUTATION ABOVE KNEE;  Surgeon: Algernon Huxley, MD;  Location: [facility] ORS;  Service: General;  Laterality: Right;  . APPLICATION OF WOUND VAC Right 04/11/2018   Procedure: APPLICATION OF WOUND VAC;  Surgeon: Algernon Huxley, MD;  Location: [facility] ORS;  Service: Vascular;  Laterality: Right;  . AV FISTULA PLACEMENT Left 09/18/2015   Procedure: INSERTION OF ARTERIOVENOUS (AV) GORE-TEX GRAFT ARM ( BRACH/AXILLARY GRAFT W/ INSTANT STICK GRAFT );  Surgeon: Katha Cabal, MD;  Location: [facility] ORS;  Service: Vascular;  Laterality: Left;  . AV FISTULA PLACEMENT Right 07/19/2018   Procedure: INSERTION OF ARTERIOVENOUS (AV) GORE-TEX GRAFT ARM ( BRACHIAL AXILLARY);  Surgeon: Algernon Huxley, MD;  Location: [facility] ORS;  Service: Vascular;  Laterality: Right;  . DIALYSIS FISTULA CREATION Right 10/2017   right chest perm cath  . DIALYSIS/PERMA CATHETER REMOVAL N/A 09/13/2018   Procedure: DIALYSIS/PERMA CATHETER REMOVAL;  Surgeon: Algernon Huxley, MD;  Location: [place] CV LAB;  Service: Cardiovascular;  Laterality: N/A;  . ESOPHAGOGASTRODUODENOSCOPY N/A 12/19/2017   Procedure: ESOPHAGOGASTRODUODENOSCOPY (EGD);  Surgeon: Lin Landsman, MD;  Location: [facility] ENDOSCOPY;  Service: Gastroenterology;  Laterality: N/A;  . LOWER EXTREMITY ANGIOGRAPHY Left 11/16/2017   Procedure: LOWER EXTREMITY ANGIOGRAPHY;  Surgeon: Algernon Huxley, MD;  Location: [place] CV LAB;  Service: Cardiovascular;  Laterality: Left;  . LOWER EXTREMITY ANGIOGRAPHY Right 01/18/2018   Procedure: LOWER EXTREMITY ANGIOGRAPHY;  Surgeon:  Algernon Huxley, MD;  Location: [place] CV LAB;  Service: Cardiovascular;  Laterality: Right;  . LOWER EXTREMITY ANGIOGRAPHY Left 04/02/2018   Procedure: LOWER EXTREMITY ANGIOGRAPHY;  Surgeon: Algernon Huxley, MD;  Location: [place] CV LAB;  Service: Cardiovascular;  Laterality: Left;  . LOWER EXTREMITY ANGIOGRAPHY Right 04/09/2018   Procedure: Lower Extremity Angiography with possible intervention;  Surgeon: Algernon Huxley, MD;  Location: [place] CV LAB;   Service: Cardiovascular;  Laterality: Right;  . LOWER EXTREMITY ANGIOGRAPHY Right 07/23/2018   Procedure: Lower Extremity Angiography;  Surgeon: Algernon Huxley, MD;  Location: [place] CV LAB;  Service: Cardiovascular;  Laterality: Right;  . LOWER EXTREMITY ANGIOGRAPHY Right 09/13/2018   Procedure: LOWER EXTREMITY ANGIOGRAPHY;  Surgeon: Algernon Huxley, MD;  Location: [place] CV LAB;  Service: Cardiovascular;  Laterality: Right;  . LOWER EXTREMITY VENOGRAPHY Right 09/13/2018   Procedure: LOWER EXTREMITY VENOGRAPHY;  Surgeon: Algernon Huxley, MD;  Location: [place] CV LAB;  Service: Cardiovascular;  Laterality: Right;  . PERIPHERAL VASCULAR CATHETERIZATION Left 09/01/2015   Procedure: A/V Shuntogram/Fistulagram;  Surgeon: Katha Cabal, MD;  Location: [place] CV LAB;  Service: Cardiovascular;  Laterality: Left;  . PERIPHERAL VASCULAR CATHETERIZATION N/A 09/30/2015   Procedure: A/V Shuntogram/Fistulagram with perm cathether removal;  Surgeon: Algernon Huxley, MD;  Location: [place] CV LAB;  Service: Cardiovascular;  Laterality: N/A;  . PERIPHERAL VASCULAR CATHETERIZATION Left 09/30/2015   Procedure: A/V Shunt Intervention;  Surgeon: Algernon Huxley, MD;  Location: [place] CV LAB;  Service: Cardiovascular;  Laterality: Left;  . PERIPHERAL VASCULAR CATHETERIZATION Left 12/03/2015   Procedure: Thrombectomy;  Surgeon: Algernon Huxley, MD;  Location: [place] CV LAB;  Service: Cardiovascular;  Laterality: Left;  . PERIPHERAL VASCULAR CATHETERIZATION Left 01/28/2016   Procedure: Thrombectomy;  Surgeon: Algernon Huxley, MD;  Location: [place] CV LAB;  Service: Cardiovascular;  Laterality: Left;  . PERIPHERAL VASCULAR CATHETERIZATION N/A 01/28/2016   Procedure: A/V Shuntogram/Fistulagram;  Surgeon: Algernon Huxley, MD;  Location: [place] CV LAB;  Service: Cardiovascular;  Laterality: N/A;  . SKIN SPLIT GRAFT Right 05/24/2018   Procedure: SKIN GRAFT SPLIT THICKNESS ( RIGHT CALF);  Surgeon:  Algernon Huxley, MD;  Location: [facility] ORS;  Service: Vascular;  Laterality: Right;  . UPPER EXTREMITY ANGIOGRAPHY  10/24/2018   Procedure: Upper Extremity Angiography;  Surgeon: Algernon Huxley, MD;  Location: [place] CV LAB;  Service: Cardiovascular;;  . UPPER EXTREMITY ANGIOGRAPHY Right 02/14/2019   Procedure: UPPER EXTREMITY ANGIOGRAPHY;  Surgeon: Algernon Huxley, MD;  Location: [place] CV LAB;  Service: Cardiovascular;  Laterality: Right;  . WOUND DEBRIDEMENT Right 04/11/2018   Procedure: DEBRIDEMENT WOUND calf muscle and skin;  Surgeon: Algernon Huxley, MD;  Location: [facility] ORS;  Service: Vascular;  Laterality: Right;    There were no vitals filed for this visit.   Subjective Assessment - 12/05/19 0943    Subjective Patient stated that he is good today, no complaints.    Pertinent History Pt underwent Rt AKA amputation in June 2020 after difficulty with wound healing/infection. Pt DC hospital to SNF for rehab. He has since been back at home. Pt uses a manual WC or power chair for daily mobility. Pt takes tranportation services to HD MWF. Pt was recently fitted for his AKA prosthesis in Jan 2021.    Limitations Standing;Walking;House hold activities    How long can you sit comfortably? No difficulty    How long can you stand  comfortably? 1 minute    How long can you walk comfortably? unable    Currently in Pain? No/denies    Multiple Pain Sites No           Treatment: Gait training with RW 100 feet x 2 with cGA and RLE having difficulty with IR Transfer training sit to stand from power wc <> nu-step with CGA Transfer training from power wc <> stand with RW x 5 , cues for sequencing and safety Therapeutic exercise: Nu-step x 5 mins L3  Supine: SLR x 15 BLE Hookling marching x 15  Hooklying abd/ER x 15  Bridging x 15 SAQ x 15 LLE  Sidelying: Hip abd x 15 , BLE   Patient performed with instruction, verbal cues, tactile cues of therapist: goal:increase tissue extensibility,  promote proper posture, improve mobility                           PT Education - 12/05/19 0943    Education Details safety with transfers    Person(s) Educated Patient    Methods Explanation    Comprehension Verbalized understanding;Returned demonstration;Need further instruction            PT Short Term Goals - 11/26/19 0956      PT SHORT TERM GOAL #1   Title Pt to demonstrate ability to perfrom STS transfer from chair c RW minGuard assist and lock his RLE knee joint without assist.    Baseline ModA from elevated surface at eval; 08/29/19: minA+1 with BUE support from regular height chair with arm rests; still requires elevated surface to perform. 7/20 able to perform STS from regular WC with RW and CGA, extended time needed to lock R knee joint and CGA for initialy steadying. highly reliant on UE    Time 4    Period Weeks    Status On-going    Target Date 12/24/19      PT SHORT TERM GOAL #2   Title Pt to demonstrate 5 degrees hip flexion 10 degrees hip abdct ROM.    Baseline lacks 5 degrees from neutral in both at eval; At visit 10, has 0 degrees flexion/extension; still lacking 5 degrees ABDCT neutral; 08/29/19: R hip: lacking 5 degrees of extension, 15 degrees abduction    Time 4    Period Weeks    Status Achieved    Target Date 06/25/19             PT Long Term Goals - 11/26/19 0957      PT LONG TERM GOAL #1   Title Pt to demonstrates 15 degrees Rt hip ABDCT, 10 degrees Rt hip extension P/ROM to facilitate prosthesis motor control.    Baseline 08/29/19: R hip: lacking 5 degrees of extension, 15 degrees abduction, 10/15/19= R hip lacking 10 deg extension; 7/20 goal deferred to next session    Time 8    Period Weeks    Status On-going    Target Date 01/21/20      PT LONG TERM GOAL #2   Title Pt to demonstrate 5/5 strength hip extension at 0 degrees flexion/extension    Baseline has 5/5 in some ranges, but not in ranges specific to standing or  gait.; 08/29/19: Unable to get to neutral flexion extension but 5/5 R hip extension in other ranges, 10/15/19= -3/5 right hip extension; 7/20 deferred to next session    Time 8    Period Weeks    Status On-going  Target Date 01/21/20      PT LONG TERM GOAL #3   Title Pt able to perfrom 5x STS from chair with RW, arms ad lib in <21sec    Baseline On 6/22 c RW and chair + 1 airex pad: 22.95sec (largely avoids use of RLE for power); 7/20, able to perform from standard WC height, 1:09seconds with BUE use.    Time 12    Period Weeks    Status On-going    Target Date 01/21/20      PT LONG TERM GOAL #4   Title Pt will be able to ambulate at least 100' with RW and supervision with proper locking of R prosthesis in order to demonstrate safe household ambulation in order to improve independence with gait without need for power wheelchair    Baseline Tolerating distance, but speed is inapropriate for distance for energy conservation and safety. Making progress, but needs more improvement.    Time 12    Period Weeks    Status On-going    Target Date 01/21/20      PT LONG TERM GOAL #5   Title Patient will continue to maintain a FOTO score of 52 or higher to indicate improvement from initial evaluation in functional activities.    Baseline initial score 48, on 7/20 score 55    Time 8    Period Weeks    Status New    Target Date 01/21/20                 Plan - 12/05/19 0944    Clinical Impression Statement Pt presents with step to gait  as he ambulates with RW at start of session. Patient requires cues for upright posture and forward posture with all exercises but tolerated all exercises well. Pt performed well with cues to exaggerate motions with stepping activity. He will benefit from continued skilled PT interventions for improved posture, strength, balance, and QOL   Personal Factors and Comorbidities Age;Fitness;Past/Current Experience;Education;Transportation    Examination-Activity  Limitations Transfers;Dressing;Squat;Stairs;Stand;Bed Mobility    Examination-Participation Restrictions Community Activity    Stability/Clinical Decision Making Stable/Uncomplicated    Rehab Potential Good    PT Frequency 2x / week    PT Duration 12 weeks    PT Treatment/Interventions ADLs/Self Care Home Management;Cryotherapy;Electrical Stimulation;DME Instruction;Gait training;Stair training;Functional mobility training;Therapeutic activities;Therapeutic exercise;Balance training;Neuromuscular re-education;Cognitive remediation;Patient/family education;Prosthetic Training;Wheelchair mobility training;Passive range of motion;Dry needling;Scar mobilization;Spinal Manipulations;Joint Manipulations    PT Next Visit Plan Targeted Rt hip extension strengthening in both end range flexion and end range extension ranges, hip rotation strengthening, standing balance adn postural control.    PT Home Exercise Plan Medbridge Access Code: [email or web address] (prone positioning), L single leg bridges, L sidelying R hip abduction, hip isometric adduction    Consulted and Agree with Plan of Care Patient           Patient will benefit from skilled therapeutic intervention in order to improve the following deficits and impairments:  Decreased activity tolerance, Decreased balance, Decreased mobility, Decreased strength, Decreased cognition, Decreased knowledge of use of DME, Decreased knowledge of precautions, Decreased endurance, Decreased range of motion, Difficulty walking, Decreased safety awareness, Decreased skin integrity, Decreased scar mobility, Hypomobility, Postural dysfunction, Increased edema, Increased muscle spasms, Increased fascial restricitons, Prosthetic Dependency, Impaired tone, Improper body mechanics  Visit Diagnosis: Difficulty in walking, not elsewhere classified  Unsteadiness on feet     Problem List Patient Active Problem List   Diagnosis Date Noted  . Cardiomyopathy ([place]) 01/22/2019  .  CHF (  congestive heart failure) ([place]) 01/22/2019  . Coronary disease 01/22/2019  . Above knee amputation of right lower extremity ([place]) 11/13/2018  . Leg ulcer, right, with fat layer exposed ([place]) 10/22/2018  . Cellulitis of right leg 07/21/2018  . Lower limb ulcer, calf, right, limited to breakdown of skin ([place]) 06/08/2018  . PVD (peripheral vascular disease) ([place]) 04/17/2018  . Malnutrition of moderate degree 04/11/2018  . Pressure injury of skin 04/06/2018  . Altered mental status 04/04/2018  . Hypothermia 04/04/2018  . Hemodialysis graft malfunction ([place]) 03/26/2018  . Hypercholesterolemia 02/27/2018  . Diabetes ([place]) 02/27/2018  . Weakness of right lower extremity 01/20/2018  . Fever   . Periumbilical abdominal pain   . Confusion 12/22/2017  . Acute delirium 12/21/2017  . Protein-calorie malnutrition, severe 12/19/2017  . Intractable nausea and vomiting 12/18/2017  . Lymphedema 12/13/2017  . Cellulitis 11/27/2017  . Chest pain 11/19/2017  . Atherosclerosis of native arteries of the extremities with ulceration ([place]) 11/07/2017  . Twitching 01/03/2017  . Elevated troponin 10/02/2015  . Complications, dialysis, catheter, mechanical ([place]) 10/02/2015  . Musculoskeletal chest pain 09/28/2015  . Chronic diastolic CHF (congestive heart failure) ([place]) 09/28/2015  . End stage renal disease ([place]) 10/09/2012  . Hypertension 10/09/2012  . Gout 10/09/2012    Alanson Puls, PT DPT 12/05/2019, 9:46 AM  [place] MAIN [facility] SERVICES [address] [place], [place], [address] Phone: [phone]   Fax:  [phone]  Name: Johnny [name]. MRN: [id number] Date of Birth: [birth year]

## 2019-12-10 ENCOUNTER — Other Ambulatory Visit: Payer: Self-pay

## 2019-12-10 ENCOUNTER — Encounter: Payer: Self-pay | Admitting: Physical Therapy

## 2019-12-10 ENCOUNTER — Ambulatory Visit: Payer: Medicare Other | Attending: Nurse Practitioner | Admitting: Physical Therapy

## 2019-12-10 DIAGNOSIS — R2681 Unsteadiness on feet: Secondary | ICD-10-CM | POA: Diagnosis present

## 2019-12-10 DIAGNOSIS — R262 Difficulty in walking, not elsewhere classified: Secondary | ICD-10-CM | POA: Insufficient documentation

## 2019-12-10 NOTE — Therapy (Signed)
[place] MAIN [facility] SERVICES [address] [place], [place], [address] Phone: [phone]   Fax:  [phone]  Physical Therapy Treatment  Patient Details  Name: Johnny [name]. MRN: [id number] Date of Birth: Aug 20, 1950 Referring Provider (PT): Eulogio Ditch, NP    Encounter Date: 12/10/2019   PT End of Session - 12/10/19 1532    Visit Number 44    Number of Visits 41    Date for PT Re-Evaluation 01/21/20    Authorization Type [employer] Medicare    Authorization Time Period 10/29/19-01/21/20: FOTO by PT (assessed 7/20 score 55)    PT Start Time 0900    PT Stop Time 1000    PT Time Calculation (min) 60 min    Equipment Utilized During Treatment Gait belt    Activity Tolerance Patient tolerated treatment well;No increased pain    Behavior During Therapy WFL for tasks assessed/performed           Past Medical History:  Diagnosis Date  . Anemia   . Anxiety   . CHF (congestive heart failure) ([place])   . Chronic kidney disease    esrd dialysis m/w/f  . Gout   . Hyperlipidemia   . Hypertension   . Myocardial infarction ([place]) 2010   10 years ago  . 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  . Peripheral vascular disease [facility])     Past Surgical History:  Procedure Laterality Date  . A/V FISTULAGRAM Right 09/06/2018   Procedure: A/V FISTULAGRAM;  Surgeon: Algernon Huxley, MD;  Location: [place] [facility];  Service: Cardiovascular;  Laterality: Right;  . A/V SHUNTOGRAM Left 06/21/2017   Procedure: A/V SHUNTOGRAM;  Surgeon: Katha Cabal, MD;  Location: [place] [facility];  Service: Cardiovascular;  Laterality: Left;  . A/V SHUNTOGRAM N/A 10/24/2018   Procedure: A/V SHUNTOGRAM;  Surgeon: Algernon Huxley, MD;  Location: [place] [facility];  Service: Cardiovascular;  Laterality: N/A;  . ABOVE KNEE LEG AMPUTATION Right 2020  . AMPUTATION Right 10/25/2018   Procedure: AMPUTATION ABOVE KNEE;  Surgeon: Algernon Huxley,  MD;  Location: [facility];  Service: General;  Laterality: Right;  . APPLICATION OF WOUND VAC Right 04/11/2018   Procedure: APPLICATION OF WOUND VAC;  Surgeon: Algernon Huxley, MD;  Location: [facility];  Service: Vascular;  Laterality: Right;  . AV FISTULA PLACEMENT Left 09/18/2015   Procedure: INSERTION OF ARTERIOVENOUS (AV) GORE-TEX GRAFT ARM ( BRACH/AXILLARY GRAFT W/ INSTANT STICK GRAFT );  Surgeon: Katha Cabal, MD;  Location: [facility];  Service: Vascular;  Laterality: Left;  . AV FISTULA PLACEMENT Right 07/19/2018   Procedure: INSERTION OF ARTERIOVENOUS (AV) GORE-TEX GRAFT ARM ( BRACHIAL AXILLARY);  Surgeon: Algernon Huxley, MD;  Location: [facility];  Service: Vascular;  Laterality: Right;  . DIALYSIS FISTULA CREATION Right 10/2017   right chest perm cath  . DIALYSIS/PERMA CATHETER REMOVAL N/A 09/13/2018   Procedure: DIALYSIS/PERMA CATHETER REMOVAL;  Surgeon: Algernon Huxley, MD;  Location: [place] [facility];  Service: Cardiovascular;  Laterality: N/A;  . ESOPHAGOGASTRODUODENOSCOPY N/A 12/19/2017   Procedure: ESOPHAGOGASTRODUODENOSCOPY (EGD);  Surgeon: Lin Landsman, MD;  Location: [facility];  Service: Gastroenterology;  Laterality: N/A;  . LOWER EXTREMITY ANGIOGRAPHY Left 11/16/2017   Procedure: LOWER EXTREMITY ANGIOGRAPHY;  Surgeon: Algernon Huxley, MD;  Location: [place] [facility];  Service: Cardiovascular;  Laterality: Left;  . LOWER EXTREMITY ANGIOGRAPHY Right 01/18/2018   Procedure: LOWER EXTREMITY ANGIOGRAPHY;  Surgeon:  Algernon Huxley, MD;  Location: [place] [facility];  Service: Cardiovascular;  Laterality: Right;  . LOWER EXTREMITY ANGIOGRAPHY Left 04/02/2018   Procedure: LOWER EXTREMITY ANGIOGRAPHY;  Surgeon: Algernon Huxley, MD;  Location: [place] [facility];  Service: Cardiovascular;  Laterality: Left;  . LOWER EXTREMITY ANGIOGRAPHY Right 04/09/2018   Procedure: Lower Extremity Angiography with possible intervention;  Surgeon: Algernon Huxley, MD;  Location: [place] [facility];   Service: Cardiovascular;  Laterality: Right;  . LOWER EXTREMITY ANGIOGRAPHY Right 07/23/2018   Procedure: Lower Extremity Angiography;  Surgeon: Algernon Huxley, MD;  Location: [place] [facility];  Service: Cardiovascular;  Laterality: Right;  . LOWER EXTREMITY ANGIOGRAPHY Right 09/13/2018   Procedure: LOWER EXTREMITY ANGIOGRAPHY;  Surgeon: Algernon Huxley, MD;  Location: [place] [facility];  Service: Cardiovascular;  Laterality: Right;  . LOWER EXTREMITY VENOGRAPHY Right 09/13/2018   Procedure: LOWER EXTREMITY VENOGRAPHY;  Surgeon: Algernon Huxley, MD;  Location: [place] [facility];  Service: Cardiovascular;  Laterality: Right;  . PERIPHERAL VASCULAR CATHETERIZATION Left 09/01/2015   Procedure: A/V Shuntogram/Fistulagram;  Surgeon: Katha Cabal, MD;  Location: [place] [facility];  Service: Cardiovascular;  Laterality: Left;  . PERIPHERAL VASCULAR CATHETERIZATION N/A 09/30/2015   Procedure: A/V Shuntogram/Fistulagram with perm cathether removal;  Surgeon: Algernon Huxley, MD;  Location: [place] [facility];  Service: Cardiovascular;  Laterality: N/A;  . PERIPHERAL VASCULAR CATHETERIZATION Left 09/30/2015   Procedure: A/V Shunt Intervention;  Surgeon: Algernon Huxley, MD;  Location: [place] [facility];  Service: Cardiovascular;  Laterality: Left;  . PERIPHERAL VASCULAR CATHETERIZATION Left 12/03/2015   Procedure: Thrombectomy;  Surgeon: Algernon Huxley, MD;  Location: [place] [facility];  Service: Cardiovascular;  Laterality: Left;  . PERIPHERAL VASCULAR CATHETERIZATION Left 01/28/2016   Procedure: Thrombectomy;  Surgeon: Algernon Huxley, MD;  Location: [place] [facility];  Service: Cardiovascular;  Laterality: Left;  . PERIPHERAL VASCULAR CATHETERIZATION N/A 01/28/2016   Procedure: A/V Shuntogram/Fistulagram;  Surgeon: Algernon Huxley, MD;  Location: [place] [facility];  Service: Cardiovascular;  Laterality: N/A;  . SKIN SPLIT GRAFT Right 05/24/2018   Procedure: SKIN GRAFT SPLIT THICKNESS ( RIGHT CALF);  Surgeon:  Algernon Huxley, MD;  Location: [facility];  Service: Vascular;  Laterality: Right;  . UPPER EXTREMITY ANGIOGRAPHY  10/24/2018   Procedure: Upper Extremity Angiography;  Surgeon: Algernon Huxley, MD;  Location: [place] [facility];  Service: Cardiovascular;;  . UPPER EXTREMITY ANGIOGRAPHY Right 02/14/2019   Procedure: UPPER EXTREMITY ANGIOGRAPHY;  Surgeon: Algernon Huxley, MD;  Location: [place] [facility];  Service: Cardiovascular;  Laterality: Right;  . WOUND DEBRIDEMENT Right 04/11/2018   Procedure: DEBRIDEMENT WOUND calf muscle and skin;  Surgeon: Algernon Huxley, MD;  Location: [facility];  Service: Vascular;  Laterality: Right;    There were no vitals filed for this visit.   Subjective Assessment - 12/10/19 1531    Subjective Patient stated that he is good today, no complaints.    Pertinent History Pt underwent Rt AKA amputation in June 2020 after difficulty with wound healing/infection. Pt DC hospital to SNF for rehab. He has since been back at home. Pt uses a manual WC or power chair for daily mobility. Pt takes tranportation services to HD MWF. Pt was recently fitted for his AKA prosthesis in Jan 2021.    Limitations Standing;Walking;House hold activities    How long can you sit comfortably? No difficulty    How long can you stand  comfortably? 1 minute    How long can you walk comfortably? unable    Currently in Pain? No/denies    Multiple Pain Sites No           Treatment:   Treatment: Gait training with RW 100 feet x 2 with cGA and RLE having difficulty with IR Transfer training sit to stand from power wc <> nu-step with CGA Transfer training from power wc <> stand with RW x 5 , cues for sequencing and safety Therapeutic exercise: Nu-step x 5 mins L3 Supine: SLR x 15 BLE Hook[place] marching x 15  Sidelying: Hip abd x 15 , BLE  Patient performed with instruction, verbal cues, tactile cues of therapist: goal:increase tissue extensibility, promote proper posture, improve  mobility                        PT Education - 12/10/19 1531    Education Details safety with transfers    Person(s) Educated Patient    Methods Explanation    Comprehension Returned demonstration;Need further instruction            PT Short Term Goals - 11/26/19 0956      PT SHORT TERM GOAL #1   Title Pt to demonstrate ability to perfrom STS transfer from chair c RW minGuard assist and lock his RLE knee joint without assist.    Baseline ModA from elevated surface at eval; 08/29/19: minA+1 with BUE support from regular height chair with arm rests; still requires elevated surface to perform. 7/20 able to perform STS from regular WC with RW and CGA, extended time needed to lock R knee joint and CGA for initialy steadying. highly reliant on UE    Time 4    Period Weeks    Status On-going    Target Date 12/24/19      PT SHORT TERM GOAL #2   Title Pt to demonstrate 5 degrees hip flexion 10 degrees hip abdct ROM.    Baseline lacks 5 degrees from neutral in both at eval; At visit 10, has 0 degrees flexion/extension; still lacking 5 degrees ABDCT neutral; 08/29/19: R hip: lacking 5 degrees of extension, 15 degrees abduction    Time 4    Period Weeks    Status Achieved    Target Date 06/25/19             PT Long Term Goals - 11/26/19 0957      PT LONG TERM GOAL #1   Title Pt to demonstrates 15 degrees Rt hip ABDCT, 10 degrees Rt hip extension P/ROM to facilitate prosthesis motor control.    Baseline 08/29/19: R hip: lacking 5 degrees of extension, 15 degrees abduction, 10/15/19= R hip lacking 10 deg extension; 7/20 goal deferred to next session    Time 8    Period Weeks    Status On-going    Target Date 01/21/20      PT LONG TERM GOAL #2   Title Pt to demonstrate 5/5 strength hip extension at 0 degrees flexion/extension    Baseline has 5/5 in some ranges, but not in ranges specific to standing or gait.; 08/29/19: Unable to get to neutral flexion extension but 5/5  R hip extension in other ranges, 10/15/19= -3/5 right hip extension; 7/20 deferred to next session    Time 8    Period Weeks    Status On-going    Target Date 01/21/20      PT LONG TERM GOAL #3  Title Pt able to perfrom 5x STS from chair with RW, arms ad lib in <21sec    Baseline On 6/22 c RW and chair + 1 airex pad: 22.95sec (largely avoids use of RLE for power); 7/20, able to perform from standard WC height, 1:09seconds with BUE use.    Time 12    Period Weeks    Status On-going    Target Date 01/21/20      PT LONG TERM GOAL #4   Title Pt will be able to ambulate at least 100' with RW and supervision with proper locking of R prosthesis in order to demonstrate safe household ambulation in order to improve independence with gait without need for power wheelchair    Baseline Tolerating distance, but speed is inapropriate for distance for energy conservation and safety. Making progress, but needs more improvement.    Time 12    Period Weeks    Status On-going    Target Date 01/21/20      PT LONG TERM GOAL #5   Title Patient will continue to maintain a FOTO score of 52 or higher to indicate improvement from initial evaluation in functional activities.    Baseline initial score 48, on 7/20 score 55    Time 8    Period Weeks    Status New    Target Date 01/21/20                 Plan - 12/10/19 1532    Clinical Impression Statement Patient instructed in intermediate strengthening and gait exercise.  Patient requires min Vcs for correct exercise technique including to improve LE control during exercise.  Patient would benefit from additional skilled PT intervention to improve balance/gait safety and reduce fall risk.   Personal Factors and Comorbidities Age;Fitness;Past/Current Experience;Education;Transportation    Examination-Activity Limitations Transfers;Dressing;Squat;Stairs;Stand;Bed Mobility    Examination-Participation Restrictions Community Activity    Stability/Clinical  Decision Making Stable/Uncomplicated    Rehab Potential Good    PT Frequency 2x / week    PT Duration 12 weeks    PT Treatment/Interventions ADLs/Self Care Home Management;Cryotherapy;Electrical Stimulation;DME Instruction;Gait training;Stair training;Functional mobility training;Therapeutic activities;Therapeutic exercise;Balance training;Neuromuscular re-education;Cognitive remediation;Patient/family education;Prosthetic Training;Wheelchair mobility training;Passive range of motion;Dry needling;Scar mobilization;Spinal Manipulations;Joint Manipulations    PT Next Visit Plan Targeted Rt hip extension strengthening in both end range flexion and end range extension ranges, hip rotation strengthening, standing balance adn postural control.    PT Home Exercise Plan Medbridge Access Code: [email or web address] (prone positioning), L single leg bridges, L sidelying R hip abduction, hip isometric adduction    Consulted and Agree with Plan of Care Patient           Patient will benefit from skilled therapeutic intervention in order to improve the following deficits and impairments:  Decreased activity tolerance, Decreased balance, Decreased mobility, Decreased strength, Decreased cognition, Decreased knowledge of use of DME, Decreased knowledge of precautions, Decreased endurance, Decreased range of motion, Difficulty walking, Decreased safety awareness, Decreased skin integrity, Decreased scar mobility, Hypomobility, Postural dysfunction, Increased edema, Increased muscle spasms, Increased fascial restricitons, Prosthetic Dependency, Impaired tone, Improper body mechanics  Visit Diagnosis: Difficulty in walking, not elsewhere classified  Unsteadiness on feet     Problem List Patient Active Problem List   Diagnosis Date Noted  . Cardiomyopathy ([place]) 01/22/2019  . CHF (congestive heart failure) ([place]) 01/22/2019  . Coronary disease 01/22/2019  . Above knee amputation of right lower extremity ([place]) 11/13/2018   . Leg ulcer, right, with fat layer exposed ([place]) 10/22/2018  .  Cellulitis of right leg 07/21/2018  . Lower limb ulcer, calf, right, limited to breakdown of skin ([place]) 06/08/2018  . PVD (peripheral vascular disease) ([place]) 04/17/2018  . Malnutrition of moderate degree 04/11/2018  . Pressure injury of skin 04/06/2018  . Altered mental status 04/04/2018  . Hypothermia 04/04/2018  . Hemodialysis graft malfunction ([place]) 03/26/2018  . Hypercholesterolemia 02/27/2018  . Diabetes ([place]) 02/27/2018  . Weakness of right lower extremity 01/20/2018  . Fever   . Periumbilical abdominal pain   . Confusion 12/22/2017  . Acute delirium 12/21/2017  . Protein-calorie malnutrition, severe 12/19/2017  . Intractable nausea and vomiting 12/18/2017  . Lymphedema 12/13/2017  . Cellulitis 11/27/2017  . Chest pain 11/19/2017  . Atherosclerosis of native arteries of the extremities with ulceration ([place]) 11/07/2017  . Twitching 01/03/2017  . Elevated troponin 10/02/2015  . Complications, dialysis, catheter, mechanical ([place]) 10/02/2015  . Musculoskeletal chest pain 09/28/2015  . Chronic diastolic CHF (congestive heart failure) ([place]) 09/28/2015  . End stage renal disease ([place]) 10/09/2012  . Hypertension 10/09/2012  . Gout 10/09/2012    Alanson Puls, PT DPT 12/10/2019, 3:36 PM  [place] MAIN [facility] SERVICES [address] [place], [place], [address] Phone: [phone]   Fax:  [phone]  Name: Johnny [name]. MRN: [id number] Date of Birth: 10-07-1950

## 2019-12-12 ENCOUNTER — Other Ambulatory Visit: Payer: Self-pay

## 2019-12-12 ENCOUNTER — Ambulatory Visit: Payer: Medicare Other | Admitting: Physical Therapy

## 2019-12-12 ENCOUNTER — Encounter: Payer: Self-pay | Admitting: Physical Therapy

## 2019-12-12 DIAGNOSIS — R262 Difficulty in walking, not elsewhere classified: Secondary | ICD-10-CM

## 2019-12-12 DIAGNOSIS — R2681 Unsteadiness on feet: Secondary | ICD-10-CM

## 2019-12-12 NOTE — Therapy (Signed)
[place] MAIN [facility] SERVICES [address] [place], [place], [address] Phone: [phone]   Fax:  [phone]  Physical Therapy Treatment  Patient Details  Name: Johnny [name]. MRN: [id number] Date of Birth: 1951-01-24 Referring Provider (PT): Eulogio Ditch, NP    Encounter Date: 12/12/2019   PT End of Session - 12/12/19 0921    Visit Number 45    Number of Visits 41    Date for PT Re-Evaluation 01/21/20    Authorization Type [employer] Medicare    Authorization Time Period 10/29/19-01/21/20: FOTO by PT (assessed 7/20 score 55)    PT Start Time 0901    PT Stop Time 1000    PT Time Calculation (min) 59 min    Equipment Utilized During Treatment Gait belt    Activity Tolerance Patient tolerated treatment well;No increased pain    Behavior During Therapy WFL for tasks assessed/performed           Past Medical History:  Diagnosis Date  . Anemia   . Anxiety   . CHF (congestive heart failure) ([place])   . Chronic kidney disease    esrd dialysis m/w/f  . Gout   . Hyperlipidemia   . Hypertension   . Myocardial infarction ([place]) 2010   10 years ago  . 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  . Peripheral vascular disease [facility])     Past Surgical History:  Procedure Laterality Date  . A/V FISTULAGRAM Right 09/06/2018   Procedure: A/V FISTULAGRAM;  Surgeon: Algernon Huxley, MD;  Location: [place] CV LAB;  Service: Cardiovascular;  Laterality: Right;  . A/V SHUNTOGRAM Left 06/21/2017   Procedure: A/V SHUNTOGRAM;  Surgeon: Katha Cabal, MD;  Location: [place] [facility] LAB;  Service: Cardiovascular;  Laterality: Left;  . A/V SHUNTOGRAM N/A 10/24/2018   Procedure: A/V SHUNTOGRAM;  Surgeon: Algernon Huxley, MD;  Location: [place] [facility] LAB;  Service: Cardiovascular;  Laterality: N/A;  . ABOVE KNEE LEG AMPUTATION Right 2020  . AMPUTATION Right 10/25/2018   Procedure: AMPUTATION ABOVE KNEE;  Surgeon: Algernon Huxley,  MD;  Location: [facility] ORS;  Service: General;  Laterality: Right;  . APPLICATION OF WOUND VAC Right 04/11/2018   Procedure: APPLICATION OF WOUND VAC;  Surgeon: Algernon Huxley, MD;  Location: [facility] ORS;  Service: Vascular;  Laterality: Right;  . AV FISTULA PLACEMENT Left 09/18/2015   Procedure: INSERTION OF ARTERIOVENOUS (AV) GORE-TEX GRAFT ARM ( BRACH/AXILLARY GRAFT W/ INSTANT STICK GRAFT );  Surgeon: Katha Cabal, MD;  Location: [facility] ORS;  Service: Vascular;  Laterality: Left;  . AV FISTULA PLACEMENT Right 07/19/2018   Procedure: INSERTION OF ARTERIOVENOUS (AV) GORE-TEX GRAFT ARM ( BRACHIAL AXILLARY);  Surgeon: Algernon Huxley, MD;  Location: [facility] ORS;  Service: Vascular;  Laterality: Right;  . DIALYSIS FISTULA CREATION Right 10/2017   right chest perm cath  . DIALYSIS/PERMA CATHETER REMOVAL N/A 09/13/2018   Procedure: DIALYSIS/PERMA CATHETER REMOVAL;  Surgeon: Algernon Huxley, MD;  Location: [place] [facility] LAB;  Service: Cardiovascular;  Laterality: N/A;  . ESOPHAGOGASTRODUODENOSCOPY N/A 12/19/2017   Procedure: ESOPHAGOGASTRODUODENOSCOPY (EGD);  Surgeon: Lin Landsman, MD;  Location: [facility] ENDOSCOPY;  Service: Gastroenterology;  Laterality: N/A;  . LOWER EXTREMITY ANGIOGRAPHY Left 11/16/2017   Procedure: LOWER EXTREMITY ANGIOGRAPHY;  Surgeon: Algernon Huxley, MD;  Location: [place] [facility] LAB;  Service: Cardiovascular;  Laterality: Left;  . LOWER EXTREMITY ANGIOGRAPHY Right 01/18/2018   Procedure: LOWER EXTREMITY ANGIOGRAPHY;  Surgeon:  Algernon Huxley, MD;  Location: [place] [facility];  Service: Cardiovascular;  Laterality: Right;  . LOWER EXTREMITY ANGIOGRAPHY Left 04/02/2018   Procedure: LOWER EXTREMITY ANGIOGRAPHY;  Surgeon: Algernon Huxley, MD;  Location: [place] [facility];  Service: Cardiovascular;  Laterality: Left;  . LOWER EXTREMITY ANGIOGRAPHY Right 04/09/2018   Procedure: Lower Extremity Angiography with possible intervention;  Surgeon: Algernon Huxley, MD;  Location: [place] [facility] LAB;   Service: Cardiovascular;  Laterality: Right;  . LOWER EXTREMITY ANGIOGRAPHY Right 07/23/2018   Procedure: Lower Extremity Angiography;  Surgeon: Algernon Huxley, MD;  Location: [place] [facility];  Service: Cardiovascular;  Laterality: Right;  . LOWER EXTREMITY ANGIOGRAPHY Right 09/13/2018   Procedure: LOWER EXTREMITY ANGIOGRAPHY;  Surgeon: Algernon Huxley, MD;  Location: [place] [facility];  Service: Cardiovascular;  Laterality: Right;  . LOWER EXTREMITY VENOGRAPHY Right 09/13/2018   Procedure: LOWER EXTREMITY VENOGRAPHY;  Surgeon: Algernon Huxley, MD;  Location: [place] [facility] LAB;  Service: Cardiovascular;  Laterality: Right;  . PERIPHERAL VASCULAR CATHETERIZATION Left 09/01/2015   Procedure: A/V Shuntogram/Fistulagram;  Surgeon: Katha Cabal, MD;  Location: [place] [facility];  Service: Cardiovascular;  Laterality: Left;  . PERIPHERAL VASCULAR CATHETERIZATION N/A 09/30/2015   Procedure: A/V Shuntogram/Fistulagram with perm cathether removal;  Surgeon: Algernon Huxley, MD;  Location: [place] [facility];  Service: Cardiovascular;  Laterality: N/A;  . PERIPHERAL VASCULAR CATHETERIZATION Left 09/30/2015   Procedure: A/V Shunt Intervention;  Surgeon: Algernon Huxley, MD;  Location: [place] [facility] LAB;  Service: Cardiovascular;  Laterality: Left;  . PERIPHERAL VASCULAR CATHETERIZATION Left 12/03/2015   Procedure: Thrombectomy;  Surgeon: Algernon Huxley, MD;  Location: [place] [facility] LAB;  Service: Cardiovascular;  Laterality: Left;  . PERIPHERAL VASCULAR CATHETERIZATION Left 01/28/2016   Procedure: Thrombectomy;  Surgeon: Algernon Huxley, MD;  Location: [place] [facility] LAB;  Service: Cardiovascular;  Laterality: Left;  . PERIPHERAL VASCULAR CATHETERIZATION N/A 01/28/2016   Procedure: A/V Shuntogram/Fistulagram;  Surgeon: Algernon Huxley, MD;  Location: [place] [facility];  Service: Cardiovascular;  Laterality: N/A;  . SKIN SPLIT GRAFT Right 05/24/2018   Procedure: SKIN GRAFT SPLIT THICKNESS ( RIGHT CALF);  Surgeon:  Algernon Huxley, MD;  Location: [facility] ORS;  Service: Vascular;  Laterality: Right;  . UPPER EXTREMITY ANGIOGRAPHY  10/24/2018   Procedure: Upper Extremity Angiography;  Surgeon: Algernon Huxley, MD;  Location: [place] [facility] LAB;  Service: Cardiovascular;;  . UPPER EXTREMITY ANGIOGRAPHY Right 02/14/2019   Procedure: UPPER EXTREMITY ANGIOGRAPHY;  Surgeon: Algernon Huxley, MD;  Location: [place] [facility];  Service: Cardiovascular;  Laterality: Right;  . WOUND DEBRIDEMENT Right 04/11/2018   Procedure: DEBRIDEMENT WOUND calf muscle and skin;  Surgeon: Algernon Huxley, MD;  Location: [facility] ORS;  Service: Vascular;  Laterality: Right;    There were no vitals filed for this visit.   Subjective Assessment - 12/12/19 0906    Subjective Patient stated that he is good today, no complaints.    Pertinent History Pt underwent Rt AKA amputation in June 2020 after difficulty with wound healing/infection. Pt DC hospital to SNF for rehab. He has since been back at home. Pt uses a manual WC or power chair for daily mobility. Pt takes tranportation services to HD MWF. Pt was recently fitted for his AKA prosthesis in Jan 2021.    Limitations Standing;Walking;House hold activities    How long can you sit comfortably? No difficulty    How long can you stand  comfortably? 1 minute    How long can you walk comfortably? unable    Currently in Pain? No/denies    Multiple Pain Sites No              Treatment: Gait training with RW 100 feet x2with cGA and RLE having difficulty with IR Transfer training sit to stand from power wc <> nu-step withCGA Transfer training from power wc <> stand with RW x 5 , cues for sequencing and safety Therapeutic exercise: Nu-step x 5 mins L3 Supine: SLR x 15 BLE [place] marching x 15  Sidelying: Hip abd x 15 , BLE  Patient performed with instruction, verbal cues, tactile cues of therapist: goal:increase tissue extensibility, promote proper posture, improve  mobility                         PT Education - 12/12/19 0906    Education Details safety with transfers    Person(s) Educated Patient    Methods Explanation    Comprehension Returned demonstration            PT Short Term Goals - 11/26/19 0956      PT SHORT TERM GOAL #1   Title Pt to demonstrate ability to perfrom STS transfer from chair c RW minGuard assist and lock his RLE knee joint without assist.    Baseline ModA from elevated surface at eval; 08/29/19: minA+1 with BUE support from regular height chair with arm rests; still requires elevated surface to perform. 7/20 able to perform STS from regular WC with RW and CGA, extended time needed to lock R knee joint and CGA for initialy steadying. highly reliant on UE    Time 4    Period Weeks    Status On-going    Target Date 12/24/19      PT SHORT TERM GOAL #2   Title Pt to demonstrate 5 degrees hip flexion 10 degrees hip abdct ROM.    Baseline lacks 5 degrees from neutral in both at eval; At visit 10, has 0 degrees flexion/extension; still lacking 5 degrees ABDCT neutral; 08/29/19: R hip: lacking 5 degrees of extension, 15 degrees abduction    Time 4    Period Weeks    Status Achieved    Target Date 06/25/19             PT Long Term Goals - 11/26/19 0957      PT LONG TERM GOAL #1   Title Pt to demonstrates 15 degrees Rt hip ABDCT, 10 degrees Rt hip extension P/ROM to facilitate prosthesis motor control.    Baseline 08/29/19: R hip: lacking 5 degrees of extension, 15 degrees abduction, 10/15/19= R hip lacking 10 deg extension; 7/20 goal deferred to next session    Time 8    Period Weeks    Status On-going    Target Date 01/21/20      PT LONG TERM GOAL #2   Title Pt to demonstrate 5/5 strength hip extension at 0 degrees flexion/extension    Baseline has 5/5 in some ranges, but not in ranges specific to standing or gait.; 08/29/19: Unable to get to neutral flexion extension but 5/5 R hip extension in  other ranges, 10/15/19= -3/5 right hip extension; 7/20 deferred to next session    Time 8    Period Weeks    Status On-going    Target Date 01/21/20      PT LONG TERM GOAL #3   Title Pt able  to perfrom 5x STS from chair with RW, arms ad lib in <21sec    Baseline On 6/22 c RW and chair + 1 airex pad: 22.95sec (largely avoids use of RLE for power); 7/20, able to perform from standard WC height, 1:09seconds with BUE use.    Time 12    Period Weeks    Status On-going    Target Date 01/21/20      PT LONG TERM GOAL #4   Title Pt will be able to ambulate at least 100' with RW and supervision with proper locking of R prosthesis in order to demonstrate safe household ambulation in order to improve independence with gait without need for power wheelchair    Baseline Tolerating distance, but speed is inapropriate for distance for energy conservation and safety. Making progress, but needs more improvement.    Time 12    Period Weeks    Status On-going    Target Date 01/21/20      PT LONG TERM GOAL #5   Title Patient will continue to maintain a FOTO score of 52 or higher to indicate improvement from initial evaluation in functional activities.    Baseline initial score 48, on 7/20 score 55    Time 8    Period Weeks    Status New    Target Date 01/21/20                 Plan - 12/12/19 6222    Clinical Impression Statement Patient required min verbal cueing during strengthening exercise to correct posture and form. Patient demonstrates ability to perform strengthening exercises with no pain but with moderate fatigue. Patient will continue to benefit from continued skilled therapy in order to improve dynamic standing balance and increase strength in order to decrease fall   Personal Factors and Comorbidities Age;Fitness;Past/Current Experience;Education;Transportation    Examination-Activity Limitations Transfers;Dressing;Squat;Stairs;Stand;Bed Mobility    Examination-Participation  Restrictions Community Activity    Stability/Clinical Decision Making Stable/Uncomplicated    Rehab Potential Good    PT Frequency 2x / week    PT Duration 12 weeks    PT Treatment/Interventions ADLs/Self Care Home Management;Cryotherapy;Electrical Stimulation;DME Instruction;Gait training;Stair training;Functional mobility training;Therapeutic activities;Therapeutic exercise;Balance training;Neuromuscular re-education;Cognitive remediation;Patient/family education;Prosthetic Training;Wheelchair mobility training;Passive range of motion;Dry needling;Scar mobilization;Spinal Manipulations;Joint Manipulations    PT Next Visit Plan Targeted Rt hip extension strengthening in both end range flexion and end range extension ranges, hip rotation strengthening, standing balance adn postural control.    PT Home Exercise Plan Medbridge Access Code: [email or web address] (prone positioning), L single leg bridges, L sidelying R hip abduction, hip isometric adduction    Consulted and Agree with Plan of Care Patient           Patient will benefit from skilled therapeutic intervention in order to improve the following deficits and impairments:  Decreased activity tolerance, Decreased balance, Decreased mobility, Decreased strength, Decreased cognition, Decreased knowledge of use of DME, Decreased knowledge of precautions, Decreased endurance, Decreased range of motion, Difficulty walking, Decreased safety awareness, Decreased skin integrity, Decreased scar mobility, Hypomobility, Postural dysfunction, Increased edema, Increased muscle spasms, Increased fascial restricitons, Prosthetic Dependency, Impaired tone, Improper body mechanics  Visit Diagnosis: Difficulty in walking, not elsewhere classified  Unsteadiness on feet     Problem List Patient Active Problem List   Diagnosis Date Noted  . Cardiomyopathy ([place]) 01/22/2019  . CHF (congestive heart failure) ([place]) 01/22/2019  . Coronary disease 01/22/2019  . Above  knee amputation of right lower extremity ([place]) 11/13/2018  . Leg ulcer, right,  with fat layer exposed ([place]) 10/22/2018  . Cellulitis of right leg 07/21/2018  . Lower limb ulcer, calf, right, limited to breakdown of skin ([place]) 06/08/2018  . PVD (peripheral vascular disease) ([place]) 04/17/2018  . Malnutrition of moderate degree 04/11/2018  . Pressure injury of skin 04/06/2018  . Altered mental status 04/04/2018  . Hypothermia 04/04/2018  . Hemodialysis graft malfunction ([place]) 03/26/2018  . Hypercholesterolemia 02/27/2018  . Diabetes ([place]) 02/27/2018  . Weakness of right lower extremity 01/20/2018  . Fever   . Periumbilical abdominal pain   . Confusion 12/22/2017  . Acute delirium 12/21/2017  . Protein-calorie malnutrition, severe 12/19/2017  . Intractable nausea and vomiting 12/18/2017  . Lymphedema 12/13/2017  . Cellulitis 11/27/2017  . Chest pain 11/19/2017  . Atherosclerosis of native arteries of the extremities with ulceration ([place]) 11/07/2017  . Twitching 01/03/2017  . Elevated troponin 10/02/2015  . Complications, dialysis, catheter, mechanical ([place]) 10/02/2015  . Musculoskeletal chest pain 09/28/2015  . Chronic diastolic CHF (congestive heart failure) ([place]) 09/28/2015  . End stage renal disease ([place]) 10/09/2012  . Hypertension 10/09/2012  . Gout 10/09/2012    Alanson Puls, PT DPT 12/12/2019, 9:24 AM  [place] MAIN [facility] SERVICES [address] [place], [place], [address] Phone: [phone]   Fax:  [phone]  Name: Johnny [name]. MRN: [id number] Date of Birth: 05-27-1950

## 2019-12-17 ENCOUNTER — Other Ambulatory Visit: Payer: Self-pay

## 2019-12-17 ENCOUNTER — Ambulatory Visit: Payer: Medicare Other

## 2019-12-17 DIAGNOSIS — R2681 Unsteadiness on feet: Secondary | ICD-10-CM

## 2019-12-17 DIAGNOSIS — R262 Difficulty in walking, not elsewhere classified: Secondary | ICD-10-CM

## 2019-12-17 NOTE — Therapy (Signed)
[place] MAIN [facility] SERVICES [address] [place], [place], [address] Phone: [phone]   Fax:  [phone]  Physical Therapy Treatment  Patient Details  Name: Johnny [name]. MRN: [id number] Date of Birth: [birth year] Referring Provider (PT): Eulogio Ditch, NP    Encounter Date: 12/17/2019   PT End of Session - 12/17/19 0932    Visit Number 46    Number of Visits 41    Date for PT Re-Evaluation 01/21/20    Authorization Type [employer] Medicare    Authorization Time Period 10/29/19-01/21/20: FOTO by PT (assessed 7/20 score 55)    PT Start Time 0903    PT Stop Time 0947    PT Time Calculation (min) 44 min    Equipment Utilized During Treatment Gait belt    Activity Tolerance Patient tolerated treatment well;No increased pain    Behavior During Therapy WFL for tasks assessed/performed           Past Medical History:  Diagnosis Date  . Anemia   . Anxiety   . CHF (congestive heart failure) ([place])   . Chronic kidney disease    esrd dialysis m/w/f  . Gout   . Hyperlipidemia   . Hypertension   . Myocardial infarction ([place]) 2010   10 years ago  . 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  . Peripheral vascular disease [facility])     Past Surgical History:  Procedure Laterality Date  . A/V FISTULAGRAM Right 09/06/2018   Procedure: A/V FISTULAGRAM;  Surgeon: Algernon Huxley, MD;  Location: [place] CV [facility];  Service: Cardiovascular;  Laterality: Right;  . A/V SHUNTOGRAM Left 06/21/2017   Procedure: A/V SHUNTOGRAM;  Surgeon: Katha Cabal, MD;  Location: [place] [facility];  Service: Cardiovascular;  Laterality: Left;  . A/V SHUNTOGRAM N/A 10/24/2018   Procedure: A/V SHUNTOGRAM;  Surgeon: Algernon Huxley, MD;  Location: [place] [facility];  Service: Cardiovascular;  Laterality: N/A;  . ABOVE KNEE LEG AMPUTATION Right 2020  . AMPUTATION Right 10/25/2018   Procedure: AMPUTATION ABOVE KNEE;  Surgeon: Algernon Huxley, MD;  Location: [facility] ORS;  Service: General;  Laterality: Right;  . APPLICATION OF WOUND VAC Right 04/11/2018   Procedure: APPLICATION OF WOUND VAC;  Surgeon: Algernon Huxley, MD;  Location: [facility] ORS;  Service: Vascular;  Laterality: Right;  . AV FISTULA PLACEMENT Left 09/18/2015   Procedure: INSERTION OF ARTERIOVENOUS (AV) GORE-TEX GRAFT ARM ( BRACH/AXILLARY GRAFT W/ INSTANT STICK GRAFT );  Surgeon: Katha Cabal, MD;  Location: [facility] ORS;  Service: Vascular;  Laterality: Left;  . AV FISTULA PLACEMENT Right 07/19/2018   Procedure: INSERTION OF ARTERIOVENOUS (AV) GORE-TEX GRAFT ARM ( BRACHIAL AXILLARY);  Surgeon: Algernon Huxley, MD;  Location: [facility] ORS;  Service: Vascular;  Laterality: Right;  . DIALYSIS FISTULA CREATION Right 10/2017   right chest perm cath  . DIALYSIS/PERMA CATHETER REMOVAL N/A 09/13/2018   Procedure: DIALYSIS/PERMA CATHETER REMOVAL;  Surgeon: Algernon Huxley, MD;  Location: [place] [facility];  Service: Cardiovascular;  Laterality: N/A;  . ESOPHAGOGASTRODUODENOSCOPY N/A 12/19/2017   Procedure: ESOPHAGOGASTRODUODENOSCOPY (EGD);  Surgeon: Lin Landsman, MD;  Location: [facility] ENDOSCOPY;  Service: Gastroenterology;  Laterality: N/A;  . LOWER EXTREMITY ANGIOGRAPHY Left 11/16/2017   Procedure: LOWER EXTREMITY ANGIOGRAPHY;  Surgeon: Algernon Huxley, MD;  Location: [place] [facility];  Service: Cardiovascular;  Laterality: Left;  . LOWER EXTREMITY ANGIOGRAPHY Right 01/18/2018   Procedure: LOWER EXTREMITY ANGIOGRAPHY;  Surgeon:  Algernon Huxley, MD;  Location: [place] [facility];  Service: Cardiovascular;  Laterality: Right;  . LOWER EXTREMITY ANGIOGRAPHY Left 04/02/2018   Procedure: LOWER EXTREMITY ANGIOGRAPHY;  Surgeon: Algernon Huxley, MD;  Location: [place] [facility];  Service: Cardiovascular;  Laterality: Left;  . LOWER EXTREMITY ANGIOGRAPHY Right 04/09/2018   Procedure: Lower Extremity Angiography with possible intervention;  Surgeon: Algernon Huxley, MD;  Location: [place] [facility];   Service: Cardiovascular;  Laterality: Right;  . LOWER EXTREMITY ANGIOGRAPHY Right 07/23/2018   Procedure: Lower Extremity Angiography;  Surgeon: Algernon Huxley, MD;  Location: [place] [facility];  Service: Cardiovascular;  Laterality: Right;  . LOWER EXTREMITY ANGIOGRAPHY Right 09/13/2018   Procedure: LOWER EXTREMITY ANGIOGRAPHY;  Surgeon: Algernon Huxley, MD;  Location: [place] [facility];  Service: Cardiovascular;  Laterality: Right;  . LOWER EXTREMITY VENOGRAPHY Right 09/13/2018   Procedure: LOWER EXTREMITY VENOGRAPHY;  Surgeon: Algernon Huxley, MD;  Location: [place] [facility];  Service: Cardiovascular;  Laterality: Right;  . PERIPHERAL VASCULAR CATHETERIZATION Left 09/01/2015   Procedure: A/V Shuntogram/Fistulagram;  Surgeon: Katha Cabal, MD;  Location: [place] [facility];  Service: Cardiovascular;  Laterality: Left;  . PERIPHERAL VASCULAR CATHETERIZATION N/A 09/30/2015   Procedure: A/V Shuntogram/Fistulagram with perm cathether removal;  Surgeon: Algernon Huxley, MD;  Location: [place] [facility];  Service: Cardiovascular;  Laterality: N/A;  . PERIPHERAL VASCULAR CATHETERIZATION Left 09/30/2015   Procedure: A/V Shunt Intervention;  Surgeon: Algernon Huxley, MD;  Location: [place] [facility];  Service: Cardiovascular;  Laterality: Left;  . PERIPHERAL VASCULAR CATHETERIZATION Left 12/03/2015   Procedure: Thrombectomy;  Surgeon: Algernon Huxley, MD;  Location: [place] [facility];  Service: Cardiovascular;  Laterality: Left;  . PERIPHERAL VASCULAR CATHETERIZATION Left 01/28/2016   Procedure: Thrombectomy;  Surgeon: Algernon Huxley, MD;  Location: [place] [facility];  Service: Cardiovascular;  Laterality: Left;  . PERIPHERAL VASCULAR CATHETERIZATION N/A 01/28/2016   Procedure: A/V Shuntogram/Fistulagram;  Surgeon: Algernon Huxley, MD;  Location: [place] [facility];  Service: Cardiovascular;  Laterality: N/A;  . SKIN SPLIT GRAFT Right 05/24/2018   Procedure: SKIN GRAFT SPLIT THICKNESS ( RIGHT CALF);  Surgeon:  Algernon Huxley, MD;  Location: [facility] ORS;  Service: Vascular;  Laterality: Right;  . UPPER EXTREMITY ANGIOGRAPHY  10/24/2018   Procedure: Upper Extremity Angiography;  Surgeon: Algernon Huxley, MD;  Location: [place] [facility];  Service: Cardiovascular;;  . UPPER EXTREMITY ANGIOGRAPHY Right 02/14/2019   Procedure: UPPER EXTREMITY ANGIOGRAPHY;  Surgeon: Algernon Huxley, MD;  Location: [place] [facility];  Service: Cardiovascular;  Laterality: Right;  . WOUND DEBRIDEMENT Right 04/11/2018   Procedure: DEBRIDEMENT WOUND calf muscle and skin;  Surgeon: Algernon Huxley, MD;  Location: [facility] ORS;  Service: Vascular;  Laterality: Right;    There were no vitals filed for this visit.   Subjective Assessment - 12/17/19 0905    Subjective Patient stated that he is good today. Patient notes his blood pressure dropped yesterday during dialysis and had to stop with 17 minutes left. First time that has ever hapened. Also reports doing his HEP, but prone exercises are hard. No other concerns or questions at this time.    Pertinent History Pt underwent Rt AKA amputation in June 2020 after difficulty with wound healing/infection. Pt DC hospital to SNF for rehab. He has since been back at home. Pt uses a manual WC or power chair for daily mobility. Pt [facility] tranportation services [facility] MWF. Pt was recently fitted for his AKA prosthesis in Jan 2021.    Limitations Standing;Walking;House hold activities    How long can you sit comfortably? No difficulty    How long can you stand comfortably? 1 minute    How long can you walk comfortably? unable    Currently in Pain? No/denies             TREATMENT   Gait Training Gait training with RW 105 feet and 100 feet with CGA and RLE having difficulty with IR.   Therapeutic Exercise Sitting: Hip marches x 30 each LE, cued to slow down and hold for a couple of seconds Clams with manual resistance x 15 each; Adduction with manual resistance x 15 each;  Supine: Glute  bridges with bolster under knee x 15; SAQ with L LE x 15; Hip abduction with manual resistance x 15 on R leg; Hip adduction with manual resistance x 15 on R leg; R hip flexor stretch 2 x 30 secs, holding L LE to chest and manual force to R LE for a greater stretch.    Patient performed with instruction, verbal cues, tactile cues of therapist.   Patient required min verbal cueing during strengthening exercise to correct posture and form. Patient cued to take a bigger steps with R LE and to focus on locking the knee with heel strike. Patient demonstrates ability to perform strengthening exercises with no pain but with moderate fatigue. Pt encouraged to follow-up as scheduled and continue home program. Patient will continue to benefit from continued skilled therapy in order to improve dynamic standing balance and increase strength in order to decrease fall.                          PT Short Term Goals - 11/26/19 0956      PT SHORT TERM GOAL #1   Title Pt to demonstrate ability to perfrom STS transfer from chair c RW minGuard assist and lock his RLE knee joint without assist.    Baseline ModA from elevated surface at eval; 08/29/19: minA+1 with BUE support from regular height chair with arm rests; still requires elevated surface to perform. 7/20 able to perform STS from regular WC with RW and CGA, extended time needed to lock R knee joint and CGA for initialy steadying. highly reliant on UE    Time 4    Period Weeks    Status On-going    Target Date 12/24/19      PT SHORT TERM GOAL #2   Title Pt to demonstrate 5 degrees hip flexion 10 degrees hip abdct ROM.    Baseline lacks 5 degrees from neutral in both at eval; At visit 10, has 0 degrees flexion/extension; still lacking 5 degrees ABDCT neutral; 08/29/19: R hip: lacking 5 degrees of extension, 15 degrees abduction    Time 4    Period Weeks    Status Achieved    Target Date 06/25/19             PT Long Term Goals  - 11/26/19 0957      PT LONG TERM GOAL #1   Title Pt to demonstrates 15 degrees Rt hip ABDCT, 10 degrees Rt hip extension P/ROM to facilitate prosthesis motor control.    Baseline 08/29/19: R hip: lacking 5 degrees of extension, 15 degrees abduction, 10/15/19= R hip lacking 10 deg extension; 7/20 goal deferred to next session    Time 8  Period Weeks    Status On-going    Target Date 01/21/20      PT LONG TERM GOAL #2   Title Pt to demonstrate 5/5 strength hip extension at 0 degrees flexion/extension    Baseline has 5/5 in some ranges, but not in ranges specific to standing or gait.; 08/29/19: Unable to get to neutral flexion extension but 5/5 R hip extension in other ranges, 10/15/19= -3/5 right hip extension; 7/20 deferred to next session    Time 8    Period Weeks    Status On-going    Target Date 01/21/20      PT LONG TERM GOAL #3   Title Pt able to perfrom 5x STS from chair with RW, arms ad lib in <21sec    Baseline On 6/22 c RW and chair + 1 airex pad: 22.95sec (largely avoids use of RLE for power); 7/20, able to perform from standard WC height, 1:09seconds with BUE use.    Time 12    Period Weeks    Status On-going    Target Date 01/21/20      PT LONG TERM GOAL #4   Title Pt will be able to ambulate at least 100' with RW and supervision with proper locking of R prosthesis in order to demonstrate safe household ambulation in order to improve independence with gait without need for power wheelchair    Baseline Tolerating distance, but speed is inapropriate for distance for energy conservation and safety. Making progress, but needs more improvement.    Time 12    Period Weeks    Status On-going    Target Date 01/21/20      PT LONG TERM GOAL #5   Title Patient will continue to maintain a FOTO score of 52 or higher to indicate improvement from initial evaluation in functional activities.    Baseline initial score 48, on 7/20 score 55    Time 8    Period Weeks    Status New     Target Date 01/21/20                 Plan - 12/17/19 0933    Clinical Impression Statement Patient required min verbal cueing during strengthening exercise to correct posture and form. Patient cued to take a bigger steps with R LE and to focus on locking the knee with heel strike. Patient demonstrates ability to perform strengthening exercises with no pain but with moderate fatigue. Pt encouraged to follow-up as scheduled and continue home program. Patient will continue to benefit from continued skilled therapy in order to improve dynamic standing balance and increase strength in order to decrease fall.    Personal Factors and Comorbidities Age;Fitness;Past/Current Experience;Education;Transportation    Examination-Activity Limitations Transfers;Dressing;Squat;Stairs;Stand;Bed Mobility    Examination-Participation Restrictions Community Activity    Stability/Clinical Decision Making Stable/Uncomplicated    Rehab Potential Good    PT Frequency 2x / week    PT Duration 12 weeks    PT Treatment/Interventions ADLs/Self Care Home Management;Cryotherapy;Electrical Stimulation;DME Instruction;Gait training;Stair training;Functional mobility training;Therapeutic activities;Therapeutic exercise;Balance training;Neuromuscular re-education;Cognitive remediation;Patient/family education;Prosthetic Training;Wheelchair mobility training;Passive range of motion;Dry needling;Scar mobilization;Spinal Manipulations;Joint Manipulations    PT Next Visit Plan Targeted Rt hip extension strengthening in both end range flexion and end range extension ranges, hip rotation strengthening, standing balance adn postural control.    PT Home Exercise Plan Medbridge Access Code: [email or web address] (prone positioning), L single leg bridges, L sidelying R hip abduction, hip isometric adduction    Consulted and Agree with Plan of  Care Patient           Patient will benefit from skilled therapeutic intervention in order to improve  the following deficits and impairments:  Decreased activity tolerance, Decreased balance, Decreased mobility, Decreased strength, Decreased cognition, Decreased knowledge of use of DME, Decreased knowledge of precautions, Decreased endurance, Decreased range of motion, Difficulty walking, Decreased safety awareness, Decreased skin integrity, Decreased scar mobility, Hypomobility, Postural dysfunction, Increased edema, Increased muscle spasms, Increased fascial restricitons, Prosthetic Dependency, Impaired tone, Improper body mechanics  Visit Diagnosis: Difficulty in walking, not elsewhere classified  Unsteadiness on feet     Problem List Patient Active Problem List   Diagnosis Date Noted  . Cardiomyopathy ([place]) 01/22/2019  . CHF (congestive heart failure) ([place]) 01/22/2019  . Coronary disease 01/22/2019  . Above knee amputation of right lower extremity ([place]) 11/13/2018  . Leg ulcer, right, with fat layer exposed ([place]) 10/22/2018  . Cellulitis of right leg 07/21/2018  . Lower limb ulcer, calf, right, limited to breakdown of skin ([place]) 06/08/2018  . PVD (peripheral vascular disease) ([place]) 04/17/2018  . Malnutrition of moderate degree 04/11/2018  . Pressure injury of skin 04/06/2018  . Altered mental status 04/04/2018  . Hypothermia 04/04/2018  . Hemodialysis graft malfunction ([place]) 03/26/2018  . Hypercholesterolemia 02/27/2018  . Diabetes ([place]) 02/27/2018  . Weakness of right lower extremity 01/20/2018  . Fever   . Periumbilical abdominal pain   . Confusion 12/22/2017  . Acute delirium 12/21/2017  . Protein-calorie malnutrition, severe 12/19/2017  . Intractable nausea and vomiting 12/18/2017  . Lymphedema 12/13/2017  . Cellulitis 11/27/2017  . Chest pain 11/19/2017  . Atherosclerosis of native arteries of the extremities with ulceration ([place]) 11/07/2017  . Twitching 01/03/2017  . Elevated troponin 10/02/2015  . Complications, dialysis, catheter, mechanical ([place]) 10/02/2015  .  Musculoskeletal chest pain 09/28/2015  . Chronic diastolic CHF (congestive heart failure) ([place]) 09/28/2015  . End stage renal disease ([place]) 10/09/2012  . Hypertension 10/09/2012  . Gout 10/09/2012    This entire session was performed under direct supervision and direction of a licensed therapist/therapist assistant . I have personally read, edited and approve of the note as written.   Noemi Chapel, SPT [place] Grout PT, DPT, GCS  Huprich,Jason 12/17/2019, 2:18 PM  [place] MAIN [facility] SERVICES [address] [place], [place], [address] Phone: [phone]   Fax:  [phone]  Name: Johnny [name]. MRN: [id number] Date of Birth: 1951/01/13

## 2019-12-19 ENCOUNTER — Other Ambulatory Visit: Payer: Self-pay

## 2019-12-19 ENCOUNTER — Ambulatory Visit: Payer: Medicare Other

## 2019-12-19 DIAGNOSIS — R262 Difficulty in walking, not elsewhere classified: Secondary | ICD-10-CM | POA: Diagnosis not present

## 2019-12-19 DIAGNOSIS — R2681 Unsteadiness on feet: Secondary | ICD-10-CM

## 2019-12-19 NOTE — Therapy (Signed)
[place] MAIN [facility] SERVICES [address] [place], [place], [address] Phone: [phone]   Fax:  [phone]  Physical Therapy Treatment  Patient Details  Name: Johnny [name]. MRN: [id number] Date of Birth: 1950-08-26 Referring Provider (PT): Eulogio Ditch, NP    Encounter Date: 12/19/2019   PT End of Session - 12/19/19 0913    Visit Number 47    Number of Visits 41    Date for PT Re-Evaluation 01/21/20    Authorization Type [employer] Medicare    Authorization Time Period 10/29/19-01/21/20: FOTO by PT (assessed 7/20 score 55)    PT Start Time 0931    PT Stop Time 1015    PT Time Calculation (min) 44 min    Equipment Utilized During Treatment Gait belt    Activity Tolerance Patient tolerated treatment well;No increased pain    Behavior During Therapy WFL for tasks assessed/performed           Past Medical History:  Diagnosis Date  . Anemia   . Anxiety   . CHF (congestive heart failure) ([place])   . Chronic kidney disease    esrd dialysis m/w/f  . Gout   . Hyperlipidemia   . Hypertension   . Myocardial infarction ([place]) 2010   10 years ago  . 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  . Peripheral vascular disease [facility])     Past Surgical History:  Procedure Laterality Date  . A/V FISTULAGRAM Right 09/06/2018   Procedure: A/V FISTULAGRAM;  Surgeon: Algernon Huxley, MD;  Location: [place] [facility];  Service: Cardiovascular;  Laterality: Right;  . A/V SHUNTOGRAM Left 06/21/2017   Procedure: A/V SHUNTOGRAM;  Surgeon: Katha Cabal, MD;  Location: [place] [facility];  Service: Cardiovascular;  Laterality: Left;  . A/V SHUNTOGRAM N/A 10/24/2018   Procedure: A/V SHUNTOGRAM;  Surgeon: Algernon Huxley, MD;  Location: [place] CV LAB;  Service: Cardiovascular;  Laterality: N/A;  . ABOVE KNEE LEG AMPUTATION Right 2020  . AMPUTATION Right 10/25/2018   Procedure: AMPUTATION ABOVE KNEE;  Surgeon: Algernon Huxley, MD;  Location: [facility] ORS;  Service: General;  Laterality: Right;  . APPLICATION OF WOUND VAC Right 04/11/2018   Procedure: APPLICATION OF WOUND VAC;  Surgeon: Algernon Huxley, MD;  Location: [facility] ORS;  Service: Vascular;  Laterality: Right;  . AV FISTULA PLACEMENT Left 09/18/2015   Procedure: INSERTION OF ARTERIOVENOUS (AV) GORE-TEX GRAFT ARM ( BRACH/AXILLARY GRAFT W/ INSTANT STICK GRAFT );  Surgeon: Katha Cabal, MD;  Location: [facility] ORS;  Service: Vascular;  Laterality: Left;  . AV FISTULA PLACEMENT Right 07/19/2018   Procedure: INSERTION OF ARTERIOVENOUS (AV) GORE-TEX GRAFT ARM ( BRACHIAL AXILLARY);  Surgeon: Algernon Huxley, MD;  Location: [facility] ORS;  Service: Vascular;  Laterality: Right;  . DIALYSIS FISTULA CREATION Right 10/2017   right chest perm cath  . DIALYSIS/PERMA CATHETER REMOVAL N/A 09/13/2018   Procedure: DIALYSIS/PERMA CATHETER REMOVAL;  Surgeon: Algernon Huxley, MD;  Location: [place] CV LAB;  Service: Cardiovascular;  Laterality: N/A;  . ESOPHAGOGASTRODUODENOSCOPY N/A 12/19/2017   Procedure: ESOPHAGOGASTRODUODENOSCOPY (EGD);  Surgeon: Lin Landsman, MD;  Location: [facility] ENDOSCOPY;  Service: Gastroenterology;  Laterality: N/A;  . LOWER EXTREMITY ANGIOGRAPHY Left 11/16/2017   Procedure: LOWER EXTREMITY ANGIOGRAPHY;  Surgeon: Algernon Huxley, MD;  Location: [place] CV LAB;  Service: Cardiovascular;  Laterality: Left;  . LOWER EXTREMITY ANGIOGRAPHY Right 01/18/2018   Procedure: LOWER EXTREMITY ANGIOGRAPHY;  Surgeon:  Algernon Huxley, MD;  Location: [place] CV LAB;  Service: Cardiovascular;  Laterality: Right;  . LOWER EXTREMITY ANGIOGRAPHY Left 04/02/2018   Procedure: LOWER EXTREMITY ANGIOGRAPHY;  Surgeon: Algernon Huxley, MD;  Location: [place] CV LAB;  Service: Cardiovascular;  Laterality: Left;  . LOWER EXTREMITY ANGIOGRAPHY Right 04/09/2018   Procedure: Lower Extremity Angiography with possible intervention;  Surgeon: Algernon Huxley, MD;  Location: [place] CV LAB;   Service: Cardiovascular;  Laterality: Right;  . LOWER EXTREMITY ANGIOGRAPHY Right 07/23/2018   Procedure: Lower Extremity Angiography;  Surgeon: Algernon Huxley, MD;  Location: [place] CV LAB;  Service: Cardiovascular;  Laterality: Right;  . LOWER EXTREMITY ANGIOGRAPHY Right 09/13/2018   Procedure: LOWER EXTREMITY ANGIOGRAPHY;  Surgeon: Algernon Huxley, MD;  Location: [place] [facility] LAB;  Service: Cardiovascular;  Laterality: Right;  . LOWER EXTREMITY VENOGRAPHY Right 09/13/2018   Procedure: LOWER EXTREMITY VENOGRAPHY;  Surgeon: Algernon Huxley, MD;  Location: [place] CV LAB;  Service: Cardiovascular;  Laterality: Right;  . PERIPHERAL VASCULAR CATHETERIZATION Left 09/01/2015   Procedure: A/V Shuntogram/Fistulagram;  Surgeon: Katha Cabal, MD;  Location: [place] [facility];  Service: Cardiovascular;  Laterality: Left;  . PERIPHERAL VASCULAR CATHETERIZATION N/A 09/30/2015   Procedure: A/V Shuntogram/Fistulagram with perm cathether removal;  Surgeon: Algernon Huxley, MD;  Location: [place] CV LAB;  Service: Cardiovascular;  Laterality: N/A;  . PERIPHERAL VASCULAR CATHETERIZATION Left 09/30/2015   Procedure: A/V Shunt Intervention;  Surgeon: Algernon Huxley, MD;  Location: [place] CV LAB;  Service: Cardiovascular;  Laterality: Left;  . PERIPHERAL VASCULAR CATHETERIZATION Left 12/03/2015   Procedure: Thrombectomy;  Surgeon: Algernon Huxley, MD;  Location: [place] [facility] LAB;  Service: Cardiovascular;  Laterality: Left;  . PERIPHERAL VASCULAR CATHETERIZATION Left 01/28/2016   Procedure: Thrombectomy;  Surgeon: Algernon Huxley, MD;  Location: [place] CV LAB;  Service: Cardiovascular;  Laterality: Left;  . PERIPHERAL VASCULAR CATHETERIZATION N/A 01/28/2016   Procedure: A/V Shuntogram/Fistulagram;  Surgeon: Algernon Huxley, MD;  Location: [place] CV LAB;  Service: Cardiovascular;  Laterality: N/A;  . SKIN SPLIT GRAFT Right 05/24/2018   Procedure: SKIN GRAFT SPLIT THICKNESS ( RIGHT CALF);  Surgeon:  Algernon Huxley, MD;  Location: [facility] ORS;  Service: Vascular;  Laterality: Right;  . UPPER EXTREMITY ANGIOGRAPHY  10/24/2018   Procedure: Upper Extremity Angiography;  Surgeon: Algernon Huxley, MD;  Location: [place] CV LAB;  Service: Cardiovascular;;  . UPPER EXTREMITY ANGIOGRAPHY Right 02/14/2019   Procedure: UPPER EXTREMITY ANGIOGRAPHY;  Surgeon: Algernon Huxley, MD;  Location: [place] [facility] LAB;  Service: Cardiovascular;  Laterality: Right;  . WOUND DEBRIDEMENT Right 04/11/2018   Procedure: DEBRIDEMENT WOUND calf muscle and skin;  Surgeon: Algernon Huxley, MD;  Location: [facility] ORS;  Service: Vascular;  Laterality: Right;    There were no vitals filed for this visit.   Subjective Assessment - 12/19/19 0912    Subjective Patient stated that he is good today. Patient has no updates since last session. No other concerns or questions at this time.    Pertinent History Pt underwent Rt AKA amputation in June 2020 after difficulty with wound healing/infection. Pt DC hospital to SNF for rehab. He has since been back at home. Pt uses a manual WC or power chair for daily mobility. Pt takes tranportation services to HD MWF. Pt was recently fitted for his AKA prosthesis in Jan 2021.    Limitations Standing;Walking;House hold activities    How long can  you sit comfortably? No difficulty    How long can you stand comfortably? 1 minute    How long can you walk comfortably? unable    Currently in Pain? No/denies              TREATMENT     Gait Training Gait training with RW 171 feet and 162 feet with CGA and RLE having difficulty with IR.     Therapeutic Exercise Sitting: Hip marches x 30 each LE, cued to slow down and hold for a couple of seconds; Clams with GTB x 15 each; L abduction with manual resistance x 5; Adduction with ball squeezes x 15 each;  Standing in // bars: Balancing with no UE support x 30 secs; Weight shifts with L heel raise x 10, patient cued for no UE support; Stepping  with L foot on stepping stones with L UE support 2 x 5, difficult to not use R UE or trunk for support Sit to stands with UE support x 5, patient cued to lock out knee when standing straight and then unlock it to sit down.     Patient performed with instruction, verbal cues, tactile cues of therapist.     Patient demonstrated excellent motivation during session today. He walked more distance today than previous sessions indicating increased strength and endurance. He also took bigger steps with a good heel strike to lock out the knee. Incorporated standing balance and strengthening exercises in the // bars to help with weight shifts and stepping with the L foot. Patient demonstrates ability to perform strengthening exercises with moderate fatigue. In the // bars, he notes some back pain, but able to continue with exercises. Pt encouraged to follow-up as scheduled and continue home program. Patient will continue to benefit from continued skilled therapy in order to improve dynamic standing balance and increase strength in order to decrease fall.             PT Short Term Goals - 11/26/19 0956      PT SHORT TERM GOAL #1   Title Pt to demonstrate ability to perfrom STS transfer from chair c RW minGuard assist and lock his RLE knee joint without assist.    Baseline ModA from elevated surface at eval; 08/29/19: minA+1 with BUE support from regular height chair with arm rests; still requires elevated surface to perform. 7/20 able to perform STS from regular WC with RW and CGA, extended time needed to lock R knee joint and CGA for initialy steadying. highly reliant on UE    Time 4    Period Weeks    Status On-going    Target Date 12/24/19      PT SHORT TERM GOAL #2   Title Pt to demonstrate 5 degrees hip flexion 10 degrees hip abdct ROM.    Baseline lacks 5 degrees from neutral in both at eval; At visit 10, has 0 degrees flexion/extension; still lacking 5 degrees ABDCT neutral; 08/29/19: R hip:  lacking 5 degrees of extension, 15 degrees abduction    Time 4    Period Weeks    Status Achieved    Target Date 06/25/19             PT Long Term Goals - 11/26/19 0957      PT LONG TERM GOAL #1   Title Pt to demonstrates 15 degrees Rt hip ABDCT, 10 degrees Rt hip extension P/ROM to facilitate prosthesis motor control.    Baseline 08/29/19: R hip: lacking 5 degrees  of extension, 15 degrees abduction, 10/15/19= R hip lacking 10 deg extension; 7/20 goal deferred to next session    Time 8    Period Weeks    Status On-going    Target Date 01/21/20      PT LONG TERM GOAL #2   Title Pt to demonstrate 5/5 strength hip extension at 0 degrees flexion/extension    Baseline has 5/5 in some ranges, but not in ranges specific to standing or gait.; 08/29/19: Unable to get to neutral flexion extension but 5/5 R hip extension in other ranges, 10/15/19= -3/5 right hip extension; 7/20 deferred to next session    Time 8    Period Weeks    Status On-going    Target Date 01/21/20      PT LONG TERM GOAL #3   Title Pt able to perfrom 5x STS from chair with RW, arms ad lib in <21sec    Baseline On 6/22 c RW and chair + 1 airex pad: 22.95sec (largely avoids use of RLE for power); 7/20, able to perform from standard WC height, 1:09seconds with BUE use.    Time 12    Period Weeks    Status On-going    Target Date 01/21/20      PT LONG TERM GOAL #4   Title Pt will be able to ambulate at least 100' with RW and supervision with proper locking of R prosthesis in order to demonstrate safe household ambulation in order to improve independence with gait without need for power wheelchair    Baseline Tolerating distance, but speed is inapropriate for distance for energy conservation and safety. Making progress, but needs more improvement.    Time 12    Period Weeks    Status On-going    Target Date 01/21/20      PT LONG TERM GOAL #5   Title Patient will continue to maintain a FOTO score of 52 or higher to  indicate improvement from initial evaluation in functional activities.    Baseline initial score 48, on 7/20 score 55    Time 8    Period Weeks    Status New    Target Date 01/21/20                 Plan - 12/19/19 0913    Clinical Impression Statement Patient demonstrated excellent motivation during session today. He walked more distance today than previous sessions indicating increased strength and endurance. He also took bigger steps with a good heel strike to lock out the knee. Incorporated standing balance and strengthening exercises in the // bars to help with weight shifts and stepping with the L foot. Patient demonstrates ability to perform strengthening exercises with moderate fatigue. In the // bars, he notes some back pain, but able to continue with exercises. Pt encouraged to follow-up as scheduled and continue home program. Patient will continue to benefit from continued skilled therapy in order to improve dynamic standing balance and increase strength in order to decrease fall.    Personal Factors and Comorbidities Age;Fitness;Past/Current Experience;Education;Transportation    Examination-Activity Limitations Transfers;Dressing;Squat;Stairs;Stand;Bed Mobility    Examination-Participation Restrictions Community Activity    Stability/Clinical Decision Making Stable/Uncomplicated    Rehab Potential Good    PT Frequency 2x / week    PT Duration 12 weeks    PT Treatment/Interventions ADLs/Self Care Home Management;Cryotherapy;Electrical Stimulation;DME Instruction;Gait training;Stair training;Functional mobility training;Therapeutic activities;Therapeutic exercise;Balance training;Neuromuscular re-education;Cognitive remediation;Patient/family education;Prosthetic Training;Wheelchair mobility training;Passive range of motion;Dry needling;Scar mobilization;Spinal Manipulations;Joint Manipulations    PT  Next Visit Plan Targeted Rt hip extension strengthening in both end range flexion  and end range extension ranges, hip rotation strengthening, standing balance adn postural control.    PT Home Exercise Plan Medbridge Access Code: [email or web address] (prone positioning), L single leg bridges, L sidelying R hip abduction, hip isometric adduction    Consulted and Agree with Plan of Care Patient           Patient will benefit from skilled therapeutic intervention in order to improve the following deficits and impairments:  Decreased activity tolerance, Decreased balance, Decreased mobility, Decreased strength, Decreased cognition, Decreased knowledge of use of DME, Decreased knowledge of precautions, Decreased endurance, Decreased range of motion, Difficulty walking, Decreased safety awareness, Decreased skin integrity, Decreased scar mobility, Hypomobility, Postural dysfunction, Increased edema, Increased muscle spasms, Increased fascial restricitons, Prosthetic Dependency, Impaired tone, Improper body mechanics  Visit Diagnosis: Difficulty in walking, not elsewhere classified  Unsteadiness on feet     Problem List Patient Active Problem List   Diagnosis Date Noted  . Cardiomyopathy ([place]) 01/22/2019  . CHF (congestive heart failure) ([place]) 01/22/2019  . Coronary disease 01/22/2019  . Above knee amputation of right lower extremity ([place]) 11/13/2018  . Leg ulcer, right, with fat layer exposed ([place]) 10/22/2018  . Cellulitis of right leg 07/21/2018  . Lower limb ulcer, calf, right, limited to breakdown of skin ([place]) 06/08/2018  . PVD (peripheral vascular disease) ([place]) 04/17/2018  . Malnutrition of moderate degree 04/11/2018  . Pressure injury of skin 04/06/2018  . Altered mental status 04/04/2018  . Hypothermia 04/04/2018  . Hemodialysis graft malfunction ([place]) 03/26/2018  . Hypercholesterolemia 02/27/2018  . Diabetes ([place]) 02/27/2018  . Weakness of right lower extremity 01/20/2018  . Fever   . Periumbilical abdominal pain   . Confusion 12/22/2017  . Acute delirium 12/21/2017   . Protein-calorie malnutrition, severe 12/19/2017  . Intractable nausea and vomiting 12/18/2017  . Lymphedema 12/13/2017  . Cellulitis 11/27/2017  . Chest pain 11/19/2017  . Atherosclerosis of native arteries of the extremities with ulceration ([place]) 11/07/2017  . Twitching 01/03/2017  . Elevated troponin 10/02/2015  . Complications, dialysis, catheter, mechanical ([place]) 10/02/2015  . Musculoskeletal chest pain 09/28/2015  . Chronic diastolic CHF (congestive heart failure) ([place]) 09/28/2015  . End stage renal disease ([place]) 10/09/2012  . Hypertension 10/09/2012  . Gout 10/09/2012    This entire session was performed under direct supervision and direction of a licensed therapist/therapist assistant . I have personally read, edited and approve of the note as written.   Noemi Chapel, SPT [facility] PT, DPT, GCS  Huprich,Jason 12/19/2019, 1:58 PM  [place] MAIN [facility] SERVICES [address] [place], [place], [address] Phone: [phone]   Fax:  [phone]  Name: Johnny [name]. MRN: [id number] Date of Birth: 1951/03/04

## 2019-12-23 ENCOUNTER — Ambulatory Visit: Admit: 2019-12-23 | Payer: Medicare Other | Admitting: Ophthalmology

## 2019-12-23 SURGERY — PHACOEMULSIFICATION, CATARACT, WITH IOL INSERTION
Anesthesia: Topical | Laterality: Left

## 2019-12-24 ENCOUNTER — Encounter: Payer: Medicare Other | Admitting: Physical Therapy

## 2019-12-31 ENCOUNTER — Other Ambulatory Visit: Payer: Self-pay

## 2019-12-31 ENCOUNTER — Ambulatory Visit: Payer: Medicare Other | Admitting: Physical Therapy

## 2019-12-31 ENCOUNTER — Encounter: Payer: Self-pay | Admitting: Physical Therapy

## 2019-12-31 DIAGNOSIS — R262 Difficulty in walking, not elsewhere classified: Secondary | ICD-10-CM

## 2019-12-31 DIAGNOSIS — R2681 Unsteadiness on feet: Secondary | ICD-10-CM

## 2019-12-31 NOTE — Therapy (Signed)
[place] MAIN [facility] SERVICES [address] [place], [place], [address] Phone: [phone]   Fax:  [phone]  Physical Therapy Treatment  Patient Details  Name: Johnny [name]. MRN: [id number] Date of Birth: Apr 21, 1951 Referring Provider (PT): Eulogio Ditch, NP    Encounter Date: 12/31/2019   PT End of Session - 12/31/19 0921    Visit Number 48    Number of Visits 41    Date for PT Re-Evaluation 01/21/20    Authorization Type [employer] Medicare    Authorization Time Period 10/29/19-01/21/20: FOTO by PT (assessed 7/20 score 55)    PT Start Time 0910    PT Stop Time 0955    PT Time Calculation (min) 45 min    Equipment Utilized During Treatment Gait belt    Activity Tolerance Patient tolerated treatment well;No increased pain    Behavior During Therapy WFL for tasks assessed/performed           Past Medical History:  Diagnosis Date  . Anemia   . Anxiety   . CHF (congestive heart failure) ([place])   . Chronic kidney disease    esrd dialysis m/w/f  . Gout   . Hyperlipidemia   . Hypertension   . Myocardial infarction ([place]) 2010   10 years ago  . 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  . Peripheral vascular disease [facility])     Past Surgical History:  Procedure Laterality Date  . A/V FISTULAGRAM Right 09/06/2018   Procedure: A/V FISTULAGRAM;  Surgeon: Algernon Huxley, MD;  Location: [place] [facility];  Service: Cardiovascular;  Laterality: Right;  . A/V SHUNTOGRAM Left 06/21/2017   Procedure: A/V SHUNTOGRAM;  Surgeon: Katha Cabal, MD;  Location: [place] [facility];  Service: Cardiovascular;  Laterality: Left;  . A/V SHUNTOGRAM N/A 10/24/2018   Procedure: A/V SHUNTOGRAM;  Surgeon: Algernon Huxley, MD;  Location: [place] [facility];  Service: Cardiovascular;  Laterality: N/A;  . ABOVE KNEE LEG AMPUTATION Right 2020  . AMPUTATION Right 10/25/2018   Procedure: AMPUTATION ABOVE KNEE;  Surgeon: Algernon Huxley, MD;  Location: [facility] ORS;  Service: General;  Laterality: Right;  . APPLICATION OF WOUND VAC Right 04/11/2018   Procedure: APPLICATION OF WOUND VAC;  Surgeon: Algernon Huxley, MD;  Location: [facility] ORS;  Service: Vascular;  Laterality: Right;  . AV FISTULA PLACEMENT Left 09/18/2015   Procedure: INSERTION OF ARTERIOVENOUS (AV) GORE-TEX GRAFT ARM ( BRACH/AXILLARY GRAFT W/ INSTANT STICK GRAFT );  Surgeon: Katha Cabal, MD;  Location: [facility] ORS;  Service: Vascular;  Laterality: Left;  . AV FISTULA PLACEMENT Right 07/19/2018   Procedure: INSERTION OF ARTERIOVENOUS (AV) GORE-TEX GRAFT ARM ( BRACHIAL AXILLARY);  Surgeon: Algernon Huxley, MD;  Location: [facility] ORS;  Service: Vascular;  Laterality: Right;  . DIALYSIS FISTULA CREATION Right 10/2017   right chest perm cath  . DIALYSIS/PERMA CATHETER REMOVAL N/A 09/13/2018   Procedure: DIALYSIS/PERMA CATHETER REMOVAL;  Surgeon: Algernon Huxley, MD;  Location: [place] [facility];  Service: Cardiovascular;  Laterality: N/A;  . ESOPHAGOGASTRODUODENOSCOPY N/A 12/19/2017   Procedure: ESOPHAGOGASTRODUODENOSCOPY (EGD);  Surgeon: Lin Landsman, MD;  Location: [facility] ENDOSCOPY;  Service: Gastroenterology;  Laterality: N/A;  . LOWER EXTREMITY ANGIOGRAPHY Left 11/16/2017   Procedure: LOWER EXTREMITY ANGIOGRAPHY;  Surgeon: Algernon Huxley, MD;  Location: [place] [facility];  Service: Cardiovascular;  Laterality: Left;  . LOWER EXTREMITY ANGIOGRAPHY Right 01/18/2018   Procedure: LOWER EXTREMITY ANGIOGRAPHY;  Surgeon:  Algernon Huxley, MD;  Location: [place] [facility];  Service: Cardiovascular;  Laterality: Right;  . LOWER EXTREMITY ANGIOGRAPHY Left 04/02/2018   Procedure: LOWER EXTREMITY ANGIOGRAPHY;  Surgeon: Algernon Huxley, MD;  Location: [place] [facility];  Service: Cardiovascular;  Laterality: Left;  . LOWER EXTREMITY ANGIOGRAPHY Right 04/09/2018   Procedure: Lower Extremity Angiography with possible intervention;  Surgeon: Algernon Huxley, MD;  Location: [place] [facility];   Service: Cardiovascular;  Laterality: Right;  . LOWER EXTREMITY ANGIOGRAPHY Right 07/23/2018   Procedure: Lower Extremity Angiography;  Surgeon: Algernon Huxley, MD;  Location: [place] [facility];  Service: Cardiovascular;  Laterality: Right;  . LOWER EXTREMITY ANGIOGRAPHY Right 09/13/2018   Procedure: LOWER EXTREMITY ANGIOGRAPHY;  Surgeon: Algernon Huxley, MD;  Location: [place] [facility];  Service: Cardiovascular;  Laterality: Right;  . LOWER EXTREMITY VENOGRAPHY Right 09/13/2018   Procedure: LOWER EXTREMITY VENOGRAPHY;  Surgeon: Algernon Huxley, MD;  Location: [place] [facility];  Service: Cardiovascular;  Laterality: Right;  . PERIPHERAL VASCULAR CATHETERIZATION Left 09/01/2015   Procedure: A/V Shuntogram/Fistulagram;  Surgeon: Katha Cabal, MD;  Location: [place] [facility];  Service: Cardiovascular;  Laterality: Left;  . PERIPHERAL VASCULAR CATHETERIZATION N/A 09/30/2015   Procedure: A/V Shuntogram/Fistulagram with perm cathether removal;  Surgeon: Algernon Huxley, MD;  Location: [place] [facility];  Service: Cardiovascular;  Laterality: N/A;  . PERIPHERAL VASCULAR CATHETERIZATION Left 09/30/2015   Procedure: A/V Shunt Intervention;  Surgeon: Algernon Huxley, MD;  Location: [place] [facility];  Service: Cardiovascular;  Laterality: Left;  . PERIPHERAL VASCULAR CATHETERIZATION Left 12/03/2015   Procedure: Thrombectomy;  Surgeon: Algernon Huxley, MD;  Location: [place] [facility];  Service: Cardiovascular;  Laterality: Left;  . PERIPHERAL VASCULAR CATHETERIZATION Left 01/28/2016   Procedure: Thrombectomy;  Surgeon: Algernon Huxley, MD;  Location: [place] [facility];  Service: Cardiovascular;  Laterality: Left;  . PERIPHERAL VASCULAR CATHETERIZATION N/A 01/28/2016   Procedure: A/V Shuntogram/Fistulagram;  Surgeon: Algernon Huxley, MD;  Location: [place] [facility];  Service: Cardiovascular;  Laterality: N/A;  . SKIN SPLIT GRAFT Right 05/24/2018   Procedure: SKIN GRAFT SPLIT THICKNESS ( RIGHT CALF);  Surgeon:  Algernon Huxley, MD;  Location: [facility] OR[facility];  Service: Vascular;  Laterality: Right;  . UPPER EXTREMITY ANGIOGRAPHY  10/24/2018   Procedure: Upper Extremity Angiography;  Surgeon: Algernon Huxley, MD;  Location: [place] [facility];  Service: Cardiovascular;;  . UPPER EXTREMITY ANGIOGRAPHY Right 02/14/2019   Procedure: UPPER EXTREMITY ANGIOGRAPHY;  Surgeon: Algernon Huxley, MD;  Location: [place] [facility];  Service: Cardiovascular;  Laterality: Right;  . WOUND DEBRIDEMENT Right 04/11/2018   Procedure: DEBRIDEMENT WOUND calf muscle and skin;  Surgeon: Algernon Huxley, MD;  Location: [facility] OR[facility];  Service: Vascular;  Laterality: Right;    There were no vitals filed for this visit.   Subjective Assessment - 12/31/19 0920    Subjective Patient stated that he is good today. Patient has no updates since last session. No other concerns or questions at this time.    Pertinent History Pt underwent Rt AKA amputation in June 2020 after difficulty with wound healing/infection. Pt DC hospital to SNF for rehab. He has since been back at home. Pt uses a manual WC or power chair for daily mobility. Pt takes tranportation services to HD MWF. Pt was recently fitted for his AKA prosthesis in Jan 2021.    Limitations Standing;Walking;House hold activities    How long can  you sit comfortably? No difficulty    How long can you stand comfortably? 1 minute    How long can you walk comfortably? unable    Currently in Pain? No/denies    Multiple Pain Sites No              Treatment: Gait training with RW 100 feet x2with cGA and RLE having difficulty with IR Transfer training sit to stand from power wc <> nu-step withCGA Transfer training from power wc <> stand with RW x 5 , cues for sequencing and safety Therapeutic exercise: Nu-step x 5 mins L3 Supine: Bridges x 10  SLR x 15 BLE [place] marching x 15  Sidelying: Hip abd x 15 , BLE  Patient performed with instruction, verbal cues, tactile cues of therapist:  goal:increase tissue extensibility, promote proper posture, improve mobility                         PT Education - 12/31/19 0921    Education Details safety with transfers    Person(s) Educated Patient    Methods Explanation    Comprehension Verbalized understanding            PT Short Term Goals - 11/26/19 0956      PT SHORT TERM GOAL #1   Title Pt to demonstrate ability to perfrom STS transfer from chair c RW minGuard assist and lock his RLE knee joint without assist.    Baseline ModA from elevated surface at eval; 08/29/19: minA+1 with BUE support from regular height chair with arm rests; still requires elevated surface to perform. 7/20 able to perform STS from regular WC with RW and CGA, extended time needed to lock R knee joint and CGA for initialy steadying. highly reliant on UE    Time 4    Period Weeks    Status On-going    Target Date 12/24/19      PT SHORT TERM GOAL #2   Title Pt to demonstrate 5 degrees hip flexion 10 degrees hip abdct ROM.    Baseline lacks 5 degrees from neutral in both at eval; At visit 10, has 0 degrees flexion/extension; still lacking 5 degrees ABDCT neutral; 08/29/19: R hip: lacking 5 degrees of extension, 15 degrees abduction    Time 4    Period Weeks    Status Achieved    Target Date 06/25/19             PT Long Term Goals - 11/26/19 0957      PT LONG TERM GOAL #1   Title Pt to demonstrates 15 degrees Rt hip ABDCT, 10 degrees Rt hip extension P/ROM to facilitate prosthesis motor control.    Baseline 08/29/19: R hip: lacking 5 degrees of extension, 15 degrees abduction, 10/15/19= R hip lacking 10 deg extension; 7/20 goal deferred to next session    Time 8    Period Weeks    Status On-going    Target Date 01/21/20      PT LONG TERM GOAL #2   Title Pt to demonstrate 5/5 strength hip extension at 0 degrees flexion/extension    Baseline has 5/5 in some ranges, but not in ranges specific to standing or gait.; 08/29/19:  Unable to get to neutral flexion extension but 5/5 R hip extension in other ranges, 10/15/19= -3/5 right hip extension; 7/20 deferred to next session    Time 8    Period Weeks    Status On-going    Target  Date 01/21/20      PT LONG TERM GOAL #3   Title Pt able to perfrom 5x STS from chair with RW, arms ad lib in <21sec    Baseline On 6/22 c RW and chair + 1 airex pad: 22.95sec (largely avoids use of RLE for power); 7/20, able to perform from standard WC height, 1:09seconds with BUE use.    Time 12    Period Weeks    Status On-going    Target Date 01/21/20      PT LONG TERM GOAL #4   Title Pt will be able to ambulate at least 100' with RW and supervision with proper locking of R prosthesis in order to demonstrate safe household ambulation in order to improve independence with gait without need for power wheelchair    Baseline Tolerating distance, but speed is inapropriate for distance for energy conservation and safety. Making progress, but needs more improvement.    Time 12    Period Weeks    Status On-going    Target Date 01/21/20      PT LONG TERM GOAL #5   Title Patient will continue to maintain a FOTO score of 52 or higher to indicate improvement from initial evaluation in functional activities.    Baseline initial score 48, on 7/20 score 55    Time 8    Period Weeks    Status New    Target Date 01/21/20                 Plan - 12/31/19 0936    Clinical Impression Statement Patient instructed in intermediate mobility challenges, transfer training and safety training. Patient required min-mod VCs for correct positioning; Patient had increased difficulty with  ambulation and getting RLE into extension due to hip flex tightness especially in standing.  Patient would benefit from additional skilled PT intervention to improve strength, and gait. .   Personal Factors and Comorbidities Age;Fitness;Past/Current Experience;Education;Transportation    Examination-Activity Limitations  Transfers;Dressing;Squat;Stairs;Stand;Bed Mobility    Examination-Participation Restrictions Community Activity    Stability/Clinical Decision Making Stable/Uncomplicated    Rehab Potential Good    PT Frequency 2x / week    PT Duration 12 weeks    PT Treatment/Interventions ADLs/Self Care Home Management;Cryotherapy;Electrical Stimulation;DME Instruction;Gait training;Stair training;Functional mobility training;Therapeutic activities;Therapeutic exercise;Balance training;Neuromuscular re-education;Cognitive remediation;Patient/family education;Prosthetic Training;Wheelchair mobility training;Passive range of motion;Dry needling;Scar mobilization;Spinal Manipulations;Joint Manipulations    PT Next Visit Plan Targeted Rt hip extension strengthening in both end range flexion and end range extension ranges, hip rotation strengthening, standing balance adn postural control.    PT Home Exercise Plan Medbridge Access Code: [email or web address] (prone positioning), L single leg bridges, L sidelying R hip abduction, hip isometric adduction    Consulted and Agree with Plan of Care Patient           Patient will benefit from skilled therapeutic intervention in order to improve the following deficits and impairments:  Decreased activity tolerance, Decreased balance, Decreased mobility, Decreased strength, Decreased cognition, Decreased knowledge of use of DME, Decreased knowledge of precautions, Decreased endurance, Decreased range of motion, Difficulty walking, Decreased safety awareness, Decreased skin integrity, Decreased scar mobility, Hypomobility, Postural dysfunction, Increased edema, Increased muscle spasms, Increased fascial restricitons, Prosthetic Dependency, Impaired tone, Improper body mechanics  Visit Diagnosis: Difficulty in walking, not elsewhere classified  Unsteadiness on feet     Problem List Patient Active Problem List   Diagnosis Date Noted  . Cardiomyopathy ([place]) 01/22/2019  . CHF  (congestive heart failure) ([place]) 01/22/2019  .  Coronary disease 01/22/2019  . Above knee amputation of right lower extremity ([place]) 11/13/2018  . Leg ulcer, right, with fat layer exposed ([place]) 10/22/2018  . Cellulitis of right leg 07/21/2018  . Lower limb ulcer, calf, right, limited to breakdown of skin ([place]) 06/08/2018  . PVD (peripheral vascular disease) ([place]) 04/17/2018  . Malnutrition of moderate degree 04/11/2018  . Pressure injury of skin 04/06/2018  . Altered mental status 04/04/2018  . Hypothermia 04/04/2018  . Hemodialysis graft malfunction ([place]) 03/26/2018  . Hypercholesterolemia 02/27/2018  . Diabetes ([place]) 02/27/2018  . Weakness of right lower extremity 01/20/2018  . Fever   . Periumbilical abdominal pain   . Confusion 12/22/2017  . Acute delirium 12/21/2017  . Protein-calorie malnutrition, severe 12/19/2017  . Intractable nausea and vomiting 12/18/2017  . Lymphedema 12/13/2017  . Cellulitis 11/27/2017  . Chest pain 11/19/2017  . Atherosclerosis of native arteries of the extremities with ulceration ([place]) 11/07/2017  . Twitching 01/03/2017  . Elevated troponin 10/02/2015  . Complications, dialysis, catheter, mechanical ([place]) 10/02/2015  . Musculoskeletal chest pain 09/28/2015  . Chronic diastolic CHF (congestive heart failure) ([place]) 09/28/2015  . End stage renal disease ([place]) 10/09/2012  . Hypertension 10/09/2012  . Gout 10/09/2012    Alanson Puls, PT DPT 12/31/2019, 9:37 AM  [place] MAIN [facility] SERVICES [address] [place], [place], [address] Phone: [phone]   Fax:  [phone]  Name: Shakir Petrosino. MRN: [id number] Date of Birth: 24-Apr-1951

## 2020-01-02 ENCOUNTER — Ambulatory Visit: Payer: Medicare Other | Admitting: Physical Therapy

## 2020-01-02 ENCOUNTER — Telehealth (INDEPENDENT_AMBULATORY_CARE_PROVIDER_SITE_OTHER): Payer: Self-pay | Admitting: Gastroenterology

## 2020-01-02 ENCOUNTER — Encounter: Payer: Self-pay | Admitting: Physical Therapy

## 2020-01-02 ENCOUNTER — Other Ambulatory Visit: Payer: Self-pay

## 2020-01-02 ENCOUNTER — Ambulatory Visit (INDEPENDENT_AMBULATORY_CARE_PROVIDER_SITE_OTHER): Payer: Medicare Other | Admitting: Nurse Practitioner

## 2020-01-02 ENCOUNTER — Encounter (INDEPENDENT_AMBULATORY_CARE_PROVIDER_SITE_OTHER): Payer: Self-pay | Admitting: Nurse Practitioner

## 2020-01-02 VITALS — BP 170/77 | HR 80 | Resp 16

## 2020-01-02 DIAGNOSIS — Z86010 Personal history of colonic polyps: Secondary | ICD-10-CM

## 2020-01-02 DIAGNOSIS — S78111A Complete traumatic amputation at level between right hip and knee, initial encounter: Secondary | ICD-10-CM | POA: Diagnosis not present

## 2020-01-02 DIAGNOSIS — I739 Peripheral vascular disease, unspecified: Secondary | ICD-10-CM | POA: Diagnosis not present

## 2020-01-02 DIAGNOSIS — R262 Difficulty in walking, not elsewhere classified: Secondary | ICD-10-CM | POA: Diagnosis not present

## 2020-01-02 DIAGNOSIS — N186 End stage renal disease: Secondary | ICD-10-CM

## 2020-01-02 DIAGNOSIS — R2681 Unsteadiness on feet: Secondary | ICD-10-CM

## 2020-01-02 NOTE — Progress Notes (Signed)
Gastroenterology Pre-Procedure Review  Request Date: Tuesday 01/21/20 Requesting Physician: Dr. Marius Ditch  PATIENT REVIEW QUESTIONS: The patient responded to the following health history questions as indicated:    1. Are you having any GI issues? no 2. Do you have a personal history of Polyps? yes (unsure of the date or year but states polyps were present and procedure was performed at [facility]) 3. Do you have a family history of Colon Cancer or Polyps? no 4. Diabetes Mellitus? no 5. Joint replacements in the past 12 months?no 6. Major health problems in the past 3 months?no 7. Any artificial heart valves, MVP, or defibrillator?no    MEDICATIONS & ALLERGIES:    Patient reports the following regarding taking any anticoagulation/antiplatelet therapy:   Plavix, Coumadin, Eliquis, Xarelto, Lovenox, Pradaxa, Brilinta, or Effient? yes (Plavix prescribed by Dr. Kingsley Spittle) Aspirin? yes (81 mg daily)  Patient confirms/reports the following medications:  Current Outpatient Medications  Medication Sig Dispense Refill  . allopurinol (ZYLOPRIM) 100 MG tablet Take 100 mg by mouth daily.    [name] amLODipine (NORVASC) 5 MG tablet Take 1 tablet (5 mg total) by mouth daily. 30 tablet 0  . aspirin EC 81 MG tablet Take 1 tablet (81 mg total) by mouth daily. 150 tablet 2  . atorvastatin (LIPITOR) 10 MG tablet Take 1 tablet by mouth daily.    . clonazePAM (KLONOPIN) 0.5 MG tablet Take 1 tablet (0.5 mg total) by mouth as needed. 10 tablet 0  . clopidogrel (PLAVIX) 75 MG tablet Take 1 tablet (75 mg total) by mouth daily. 30 tablet 11  . folic acid-vitamin b complex-vitamin c-selenium-zinc (DIALYVITE) 3 MG TABS tablet Take 1 tablet by mouth daily.     [name] gabapentin (NEURONTIN) 100 MG capsule Take 1 capsule (100 mg total) by mouth at bedtime. (Patient taking differently: Take 100 mg by mouth 2 (two) times daily. )    . HYDROcodone-acetaminophen (NORCO) 5-325 MG tablet Take 1 tablet by mouth every 6 (six) hours as needed  for moderate pain. 20 tablet 0  . losartan (COZAAR) 100 MG tablet Take 100 mg by mouth daily.    . metoprolol tartrate (LOPRESSOR) 25 MG tablet Take 25 mg by mouth every evening.    . Multiple Vitamin (MULTIVITAMIN) LIQD Take 5 mLs by mouth daily.    . sevelamer carbonate (RENVELA) 800 MG tablet Take 2,400 mg by mouth 3 (three) times daily with meals.     . hydrALAZINE (APRESOLINE) 25 MG tablet Take 25 mg by mouth 2 (two) times daily.     No current facility-administered medications for this visit.    Patient confirms/reports the following allergies:  Allergies  Allergen Reactions  . Shellfish Allergy Anaphylaxis    No orders of the defined types were placed in this encounter.   AUTHORIZATION INFORMATION Primary Insurance: 1D#: Group #:  Secondary Insurance: 1D#: Group #:  SCHEDULE INFORMATION: Date: Tuesday 01/21/20 Time: Location:[facility]

## 2020-01-02 NOTE — Progress Notes (Signed)
Subjective:    Patient ID: Johnny [name]., male    DOB: 1950-12-31, 69 y.o.   MRN: [id number] Chief Complaint  Patient presents with  . Follow-up    eval for new prosthesis    The patient presents today for evaluation for a right lower extremity prosthetic.  The patient notes that his current prosthetic has not been fitting properly and due to this the patient has not been able to ambulate without extensive difficulty.  The patient has a right transfemoral amputation.  The patient has had some volume changes within his stump making it very difficult to utilize his prosthesis.  The patient has been eagerly working with physical therapy in order to gain the strength to walk without a walker.  Currently due to issues with his prosthetic he uses a motorized wheelchair currently.  The patient has communicated a very strong desire to get his new prosthetic socket.  He denies any fever, chills, nausea, vomiting or diarrhea.  He denies any lower extremity wounds.   Review of Systems  Musculoskeletal: Positive for gait problem.  Neurological: Positive for weakness.  All other systems reviewed and are negative.      Objective:   Physical Exam Vitals reviewed.  HENT:     Head: Normocephalic.  Cardiovascular:     Rate and Rhythm: Normal rate and regular rhythm.     Pulses:          Radial pulses are 1+ on the right side.     Arteriovenous access: right arteriovenous access is present.    Comments: Right  AV fistula with good thrill and bruit Pulmonary:     Effort: Pulmonary effort is normal.  Musculoskeletal:     Right Lower Extremity: Right leg is amputated above knee.  Neurological:     Mental Status: He is alert and oriented to person, place, and time.  Psychiatric:        Mood and Affect: Mood normal.        Behavior: Behavior normal.        Thought Content: Thought content normal.        Judgment: Judgment normal.     BP (!) 170/77 (BP Location: Left Arm)   Pulse 80   Resp  16   Past Medical History:  Diagnosis Date  . Anemia   . Anxiety   . CHF (congestive heart failure) ([place])   . Chronic kidney disease    esrd dialysis m/w/f  . Gout   . Hyperlipidemia   . Hypertension   . Myocardial infarction ([place]) 2010   10 years ago  . 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  . Peripheral vascular disease ([place])     Social History   Socioeconomic History  . Marital status: Single    Spouse name: Not on file  . Number of children: 0  . Years of education: Not on file  . Highest education level: Not on file  Occupational History  . Occupation: retired    Comment: Medical laboratory scientific officer  Tobacco Use  . Smoking status: Former Smoker    Types: Cigarettes    Quit date: 05/17/2005    Years since quitting: 14.6  . Smokeless tobacco: Never Used  Vaping Use  . Vaping Use: Never used  Substance and Sexual Activity  . Alcohol use: No  . Drug use: No  . Sexual activity: Not Currently  Other Topics Concern  . Not on file  Social History  Narrative  . Not on file   Social Determinants of Health   Financial Resource Strain:   . Difficulty of Paying Living Expenses: Not on file  Food Insecurity:   . Worried About Charity fundraiser in the Last Year: Not on file  . Ran Out of Food in the Last Year: Not on file  Transportation Needs:   . Lack of Transportation (Medical): Not on file  . Lack of Transportation (Non-Medical): Not on file  Physical Activity:   . Days of Exercise per Week: Not on file  . Minutes of Exercise per Session: Not on file  Stress:   . Feeling of Stress : Not on file  Social Connections:   . Frequency of Communication with Friends and Family: Not on file  . Frequency of Social Gatherings with Friends and Family: Not on file  . Attends Religious Services: Not on file  . Active Member of Clubs or Organizations: Not on file  . Attends Archivist Meetings: Not on file  . Marital Status:  Not on file  Intimate Partner Violence:   . Fear of Current or Ex-Partner: Not on file  . Emotionally Abused: Not on file  . Physically Abused: Not on file  . Sexually Abused: Not on file    Past Surgical History:  Procedure Laterality Date  . A/V FISTULAGRAM Right 09/06/2018   Procedure: A/V FISTULAGRAM;  Surgeon: Algernon Huxley, MD;  Location: [facility];  Service: Cardiovascular;  Laterality: Right;  . A/V SHUNTOGRAM Left 06/21/2017   Procedure: A/V SHUNTOGRAM;  Surgeon: Katha Cabal, MD;  Location: [facility];  Service: Cardiovascular;  Laterality: Left;  . A/V SHUNTOGRAM N/A 10/24/2018   Procedure: A/V SHUNTOGRAM;  Surgeon: Algernon Huxley, MD;  Location: [facility];  Service: Cardiovascular;  Laterality: N/A;  . ABOVE KNEE LEG AMPUTATION Right 2020  . AMPUTATION Right 10/25/2018   Procedure: AMPUTATION ABOVE KNEE;  Surgeon: Algernon Huxley, MD;  Location: [facility];  Service: General;  Laterality: Right;  . APPLICATION OF WOUND VAC Right 04/11/2018   Procedure: APPLICATION OF WOUND VAC;  Surgeon: Algernon Huxley, MD;  Location: [facility];  Service: Vascular;  Laterality: Right;  . AV FISTULA PLACEMENT Left 09/18/2015   Procedure: INSERTION OF ARTERIOVENOUS (AV) GORE-TEX GRAFT ARM ( BRACH/AXILLARY GRAFT W/ INSTANT STICK GRAFT );  Surgeon: Katha Cabal, MD;  Location: [facility];  Service: Vascular;  Laterality: Left;  . AV FISTULA PLACEMENT Right 07/19/2018   Procedure: INSERTION OF ARTERIOVENOUS (AV) GORE-TEX GRAFT ARM ( BRACHIAL AXILLARY);  Surgeon: Algernon Huxley, MD;  Location: [facility];  Service: Vascular;  Laterality: Right;  . DIALYSIS FISTULA CREATION Right 10/2017   right chest perm cath  . DIALYSIS/PERMA CATHETER REMOVAL N/A 09/13/2018   Procedure: DIALYSIS/PERMA CATHETER REMOVAL;  Surgeon: Algernon Huxley, MD;  Location: [facility];  Service: Cardiovascular;  Laterality: N/A;  . ESOPHAGOGASTRODUODENOSCOPY N/A 12/19/2017   Procedure:  ESOPHAGOGASTRODUODENOSCOPY (EGD);  Surgeon: Lin Landsman, MD;  Location: [facility];  Service: Gastroenterology;  Laterality: N/A;  . LOWER EXTREMITY ANGIOGRAPHY Left 11/16/2017   Procedure: LOWER EXTREMITY ANGIOGRAPHY;  Surgeon: Algernon Huxley, MD;  Location: [facility];  Service: Cardiovascular;  Laterality: Left;  . LOWER EXTREMITY ANGIOGRAPHY Right 01/18/2018   Procedure: LOWER EXTREMITY ANGIOGRAPHY;  Surgeon: Algernon Huxley, MD;  Location: [facility];  Service: Cardiovascular;  Laterality: Right;  . LOWER EXTREMITY ANGIOGRAPHY Left 04/02/2018  Procedure: LOWER EXTREMITY ANGIOGRAPHY;  Surgeon: Algernon Huxley, MD;  Location: [facility];  Service: Cardiovascular;  Laterality: Left;  . LOWER EXTREMITY ANGIOGRAPHY Right 04/09/2018   Procedure: Lower Extremity Angiography with possible intervention;  Surgeon: Algernon Huxley, MD;  Location: [facility];  Service: Cardiovascular;  Laterality: Right;  . LOWER EXTREMITY ANGIOGRAPHY Right 07/23/2018   Procedure: Lower Extremity Angiography;  Surgeon: Algernon Huxley, MD;  Location: [facility];  Service: Cardiovascular;  Laterality: Right;  . LOWER EXTREMITY ANGIOGRAPHY Right 09/13/2018   Procedure: LOWER EXTREMITY ANGIOGRAPHY;  Surgeon: Algernon Huxley, MD;  Location: [facility];  Service: Cardiovascular;  Laterality: Right;  . LOWER EXTREMITY VENOGRAPHY Right 09/13/2018   Procedure: LOWER EXTREMITY VENOGRAPHY;  Surgeon: Algernon Huxley, MD;  Location: [facility];  Service: Cardiovascular;  Laterality: Right;  . PERIPHERAL VASCULAR CATHETERIZATION Left 09/01/2015   Procedure: A/V Shuntogram/Fistulagram;  Surgeon: Katha Cabal, MD;  Location: [facility];  Service: Cardiovascular;  Laterality: Left;  . PERIPHERAL VASCULAR CATHETERIZATION N/A 09/30/2015   Procedure: A/V Shuntogram/Fistulagram with perm cathether removal;  Surgeon: Algernon Huxley, MD;  Location: [facility];  Service:  Cardiovascular;  Laterality: N/A;  . PERIPHERAL VASCULAR CATHETERIZATION Left 09/30/2015   Procedure: A/V Shunt Intervention;  Surgeon: Algernon Huxley, MD;  Location: [facility];  Service: Cardiovascular;  Laterality: Left;  . PERIPHERAL VASCULAR CATHETERIZATION Left 12/03/2015   Procedure: Thrombectomy;  Surgeon: Algernon Huxley, MD;  Location: [facility];  Service: Cardiovascular;  Laterality: Left;  . PERIPHERAL VASCULAR CATHETERIZATION Left 01/28/2016   Procedure: Thrombectomy;  Surgeon: Algernon Huxley, MD;  Location: [facility];  Service: Cardiovascular;  Laterality: Left;  . PERIPHERAL VASCULAR CATHETERIZATION N/A 01/28/2016   Procedure: A/V Shuntogram/Fistulagram;  Surgeon: Algernon Huxley, MD;  Location: [facility];  Service: Cardiovascular;  Laterality: N/A;  . SKIN SPLIT GRAFT Right 05/24/2018   Procedure: SKIN GRAFT SPLIT THICKNESS ( RIGHT CALF);  Surgeon: Algernon Huxley, MD;  Location: [facility];  Service: Vascular;  Laterality: Right;  . UPPER EXTREMITY ANGIOGRAPHY  10/24/2018   Procedure: Upper Extremity Angiography;  Surgeon: Algernon Huxley, MD;  Location: [facility];  Service: Cardiovascular;;  . UPPER EXTREMITY ANGIOGRAPHY Right 02/14/2019   Procedure: UPPER EXTREMITY ANGIOGRAPHY;  Surgeon: Algernon Huxley, MD;  Location: [facility];  Service: Cardiovascular;  Laterality: Right;  . WOUND DEBRIDEMENT Right 04/11/2018   Procedure: DEBRIDEMENT WOUND calf muscle and skin;  Surgeon: Algernon Huxley, MD;  Location: [facility];  Service: Vascular;  Laterality: Right;    Family History  Problem Relation Age of Onset  . Hypertension Other   . Heart disease Other   . Diabetes Mother     Allergies  Allergen Reactions  . Shellfish Allergy Anaphylaxis       Assessment & Plan:   1. Above knee amputation of right lower extremity ([place]) The patient is a right transfemoral amputee that has recently had some changes in his residual limb which is making  donning of his prosthetic very difficult.  Mr. Frazzini has verbally communicated a strong desire to get a new prosthetic socket so that he can begin ambulating again.  The patient is a K2 level ambulator and he will benefit from a replacement socket and supplies.  2. End stage renal disease ([place]) The patient denies any issues currently with dialysis.  He denies  any issues with his access.  The patient has an upcoming appointment in 1 month for surveillance of his AV fistula.  3. PVD (peripheral vascular disease) ([place]) She denies any extensive claudication or rest pain like symptoms.  Patient has upcoming appointment in 1 month for surveillance of peripheral arterial disease.   Current Outpatient Medications on File Prior to Visit  Medication Sig Dispense Refill  . allopurinol (ZYLOPRIM) 100 MG tablet Take 100 mg by mouth daily.    [name] amLODipine (NORVASC) 5 MG tablet Take 1 tablet (5 mg total) by mouth daily. 30 tablet 0  . aspirin EC 81 MG tablet Take 1 tablet (81 mg total) by mouth daily. 150 tablet 2  . atorvastatin (LIPITOR) 10 MG tablet Take 1 tablet by mouth daily.    . clonazePAM (KLONOPIN) 0.5 MG tablet Take 1 tablet (0.5 mg total) by mouth as needed. 10 tablet 0  . clopidogrel (PLAVIX) 75 MG tablet Take 1 tablet (75 mg total) by mouth daily. 30 tablet 11  . folic acid-vitamin b complex-vitamin c-selenium-zinc (DIALYVITE) 3 MG TABS tablet Take 1 tablet by mouth daily.     [name] gabapentin (NEURONTIN) 100 MG capsule Take 1 capsule (100 mg total) by mouth at bedtime. (Patient taking differently: Take 100 mg by mouth 2 (two) times daily. )    . hydrALAZINE (APRESOLINE) 25 MG tablet Take 25 mg by mouth 2 (two) times daily.    [name] HYDROcodone-acetaminophen (NORCO) 5-325 MG tablet Take 1 tablet by mouth every 6 (six) hours as needed for moderate pain. 20 tablet 0  . losartan (COZAAR) 100 MG tablet Take 100 mg by mouth daily.    . metoprolol tartrate (LOPRESSOR) 25 MG tablet Take 25 mg by mouth every  evening.    . Multiple Vitamin (MULTIVITAMIN) LIQD Take 5 mLs by mouth daily.    . sevelamer carbonate (RENVELA) 800 MG tablet Take 2,400 mg by mouth 3 (three) times daily with meals.      No current facility-administered medications on file prior to visit.    There are no Patient Instructions on file for this visit. No follow-ups on file.   Kris Hartmann, NP

## 2020-01-02 NOTE — Therapy (Signed)
[place] MAIN [facility] SERVICES [address] [place], [place], [address] Phone: [phone]   Fax:  [phone]  Physical Therapy Treatment  Patient Details  Name: Johnny [name]. MRN: [id number] Date of Birth: [birth year] Referring Provider (PT): Eulogio Ditch, NP    Encounter Date: 01/02/2020   PT End of Session - 01/02/20 1020    Visit Number [id number]    Number of Visits 65    Date for PT Re-Evaluation 01/21/20    Authorization Type [employer] Medicare    Authorization Time Period 10/29/19-01/21/20: FOTO by PT (assessed 7/20 score 55)    PT Start Time 0945    PT Stop Time 1015    PT Time Calculation (min) 30 min    Equipment Utilized During Treatment Gait belt    Activity Tolerance Patient tolerated treatment well;No increased pain    Behavior During Therapy WFL for tasks assessed/performed           Past Medical History:  Diagnosis Date  . Anemia   . Anxiety   . CHF (congestive heart failure) ([place])   . Chronic kidney disease    esrd dialysis m/w/f  . Gout   . Hyperlipidemia   . Hypertension   . Myocardial infarction ([place]) 2010   10 years ago  . 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  . Peripheral vascular disease [facility])     Past Surgical History:  Procedure Laterality Date  . A/V FISTULAGRAM Right 09/06/2018   Procedure: A/V FISTULAGRAM;  Surgeon: Algernon Huxley, MD;  Location: [place] CV LAB;  Service: Cardiovascular;  Laterality: Right;  . A/V SHUNTOGRAM Left 06/21/2017   Procedure: A/V SHUNTOGRAM;  Surgeon: Katha Cabal, MD;  Location: [place] CV LAB;  Service: Cardiovascular;  Laterality: Left;  . A/V SHUNTOGRAM N/A 10/24/2018   Procedure: A/V SHUNTOGRAM;  Surgeon: Algernon Huxley, MD;  Location: [place] CV LAB;  Service: Cardiovascular;  Laterality: N/A;  . ABOVE KNEE LEG AMPUTATION Right 2020  . AMPUTATION Right 10/25/2018   Procedure: AMPUTATION ABOVE KNEE;  Surgeon: Algernon Huxley, MD;  Location: [facility] ORS;  Service: General;  Laterality: Right;  . APPLICATION OF WOUND VAC Right 04/11/2018   Procedure: APPLICATION OF WOUND VAC;  Surgeon: Algernon Huxley, MD;  Location: [facility] ORS;  Service: Vascular;  Laterality: Right;  . AV FISTULA PLACEMENT Left 09/18/2015   Procedure: INSERTION OF ARTERIOVENOUS (AV) GORE-TEX GRAFT ARM ( BRACH/AXILLARY GRAFT W/ INSTANT STICK GRAFT );  Surgeon: Katha Cabal, MD;  Location: [facility] ORS;  Service: Vascular;  Laterality: Left;  . AV FISTULA PLACEMENT Right 07/19/2018   Procedure: INSERTION OF ARTERIOVENOUS (AV) GORE-TEX GRAFT ARM ( BRACHIAL AXILLARY);  Surgeon: Algernon Huxley, MD;  Location: [facility] ORS;  Service: Vascular;  Laterality: Right;  . DIALYSIS FISTULA CREATION Right 10/2017   right chest perm cath  . DIALYSIS/PERMA CATHETER REMOVAL N/A 09/13/2018   Procedure: DIALYSIS/PERMA CATHETER REMOVAL;  Surgeon: Algernon Huxley, MD;  Location: [place] CV LAB;  Service: Cardiovascular;  Laterality: N/A;  . ESOPHAGOGASTRODUODENOSCOPY N/A 12/19/2017   Procedure: ESOPHAGOGASTRODUODENOSCOPY (EGD);  Surgeon: Lin Landsman, MD;  Location: [facility] [facility] ENDOSCOPY;  Service: Gastroenterology;  Laterality: N/A;  . LOWER EXTREMITY ANGIOGRAPHY Left 11/16/2017   Procedure: LOWER EXTREMITY ANGIOGRAPHY;  Surgeon: Algernon Huxley, MD;  Location: [place] CV LAB;  Service: Cardiovascular;  Laterality: Left;  . LOWER EXTREMITY ANGIOGRAPHY Right 01/18/2018   Procedure: LOWER EXTREMITY ANGIOGRAPHY;  Surgeon:  Algernon Huxley, MD;  Location: [place] CV LAB;  Service: Cardiovascular;  Laterality: Right;  . LOWER EXTREMITY ANGIOGRAPHY Left 04/02/2018   Procedure: LOWER EXTREMITY ANGIOGRAPHY;  Surgeon: Algernon Huxley, MD;  Location: [place] CV LAB;  Service: Cardiovascular;  Laterality: Left;  . LOWER EXTREMITY ANGIOGRAPHY Right 04/09/2018   Procedure: Lower Extremity Angiography with possible intervention;  Surgeon: Algernon Huxley, MD;  Location: [place] CV LAB;   Service: Cardiovascular;  Laterality: Right;  . LOWER EXTREMITY ANGIOGRAPHY Right 07/23/2018   Procedure: Lower Extremity Angiography;  Surgeon: Algernon Huxley, MD;  Location: [place] CV LAB;  Service: Cardiovascular;  Laterality: Right;  . LOWER EXTREMITY ANGIOGRAPHY Right 09/13/2018   Procedure: LOWER EXTREMITY ANGIOGRAPHY;  Surgeon: Algernon Huxley, MD;  Location: [place] [facility] LAB;  Service: Cardiovascular;  Laterality: Right;  . LOWER EXTREMITY VENOGRAPHY Right 09/13/2018   Procedure: LOWER EXTREMITY VENOGRAPHY;  Surgeon: Algernon Huxley, MD;  Location: [place] CV LAB;  Service: Cardiovascular;  Laterality: Right;  . PERIPHERAL VASCULAR CATHETERIZATION Left 09/01/2015   Procedure: A/V Shuntogram/Fistulagram;  Surgeon: Katha Cabal, MD;  Location: [place] [facility] LAB;  Service: Cardiovascular;  Laterality: Left;  . PERIPHERAL VASCULAR CATHETERIZATION N/A 09/30/2015   Procedure: A/V Shuntogram/Fistulagram with perm cathether removal;  Surgeon: Algernon Huxley, MD;  Location: [place] CV LAB;  Service: Cardiovascular;  Laterality: N/A;  . PERIPHERAL VASCULAR CATHETERIZATION Left 09/30/2015   Procedure: A/V Shunt Intervention;  Surgeon: Algernon Huxley, MD;  Location: [place] CV LAB;  Service: Cardiovascular;  Laterality: Left;  . PERIPHERAL VASCULAR CATHETERIZATION Left 12/03/2015   Procedure: Thrombectomy;  Surgeon: Algernon Huxley, MD;  Location: [place] CV LAB;  Service: Cardiovascular;  Laterality: Left;  . PERIPHERAL VASCULAR CATHETERIZATION Left 01/28/2016   Procedure: Thrombectomy;  Surgeon: Algernon Huxley, MD;  Location: [place] [facility] LAB;  Service: Cardiovascular;  Laterality: Left;  . PERIPHERAL VASCULAR CATHETERIZATION N/A 01/28/2016   Procedure: A/V Shuntogram/Fistulagram;  Surgeon: Algernon Huxley, MD;  Location: [place] [facility] LAB;  Service: Cardiovascular;  Laterality: N/A;  . SKIN SPLIT GRAFT Right 05/24/2018   Procedure: SKIN GRAFT SPLIT THICKNESS ( RIGHT CALF);  Surgeon:  Algernon Huxley, MD;  Location: [facility] ORS;  Service: Vascular;  Laterality: Right;  . UPPER EXTREMITY ANGIOGRAPHY  10/24/2018   Procedure: Upper Extremity Angiography;  Surgeon: Algernon Huxley, MD;  Location: [place] [facility] LAB;  Service: Cardiovascular;;  . UPPER EXTREMITY ANGIOGRAPHY Right 02/14/2019   Procedure: UPPER EXTREMITY ANGIOGRAPHY;  Surgeon: Algernon Huxley, MD;  Location: [place] [facility] LAB;  Service: Cardiovascular;  Laterality: Right;  . WOUND DEBRIDEMENT Right 04/11/2018   Procedure: DEBRIDEMENT WOUND calf muscle and skin;  Surgeon: Algernon Huxley, MD;  Location: [facility] ORS;  Service: Vascular;  Laterality: Right;    There were no vitals filed for this visit.   Subjective Assessment - 01/02/20 1019    Subjective Patient stated that he is good today. He notes that his new prosthetic will be coming in next week. Patient has no updates since last session. No other concerns or questions at this time.    Pertinent History Pt underwent Rt AKA amputation in June 2020 after difficulty with wound healing/infection. Pt DC hospital to SNF for rehab. He has since been back at home. Pt uses a manual WC or power chair for daily mobility. Pt takes tranportation services to HD MWF. Pt was recently fitted for his AKA prosthesis in Jan 2021.  Limitations Standing;Walking;House hold activities    How long can you sit comfortably? No difficulty    How long can you stand comfortably? 1 minute    How long can you walk comfortably? unable    Currently in Pain? No/denies           TREATMENT     Gait Training Gait training with RW 75 feet x 2 with CGA and RLE having difficulty with IR. Cued to take bigger steps with both LEs and to achieve more L hip extension during gait.   Therapeutic Exercise Supine: Bridges x 10  SLR x 15 BLE [place] marching x 15 each LE, patient cued to slow    Patient performed with instruction, verbal cues, tactile cues of therapist.   Patient demonstrated excellent  motivation during session today. Session was abbreviated today due to schedule mix-up. Patient required min-mod VCs for correct positioning; Patient had increased difficulty with ambulation and getting RLE into extension due to hip flex tightness especially in standing. However, after cueing he took bigger steps with a good heel strike to lock out the knee. Patient demonstrates ability to perform strengthening exercises with moderate fatigue. Pt encouraged to follow-up as scheduled and continue home program. Patient will continue to benefit from continued skilled therapy in order to improve dynamic standing balance and increase strength in order to decrease fall.     PT Short Term Goals - 11/26/19 0956      PT SHORT TERM GOAL #1   Title Pt to demonstrate ability to perfrom STS transfer from chair c RW minGuard assist and lock his RLE knee joint without assist.    Baseline ModA from elevated surface at eval; 08/29/19: minA+1 with BUE support from regular height chair with arm rests; still requires elevated surface to perform. 7/20 able to perform STS from regular WC with RW and CGA, extended time needed to lock R knee joint and CGA for initialy steadying. highly reliant on UE    Time 4    Period Weeks    Status On-going    Target Date 12/24/19      PT SHORT TERM GOAL #2   Title Pt to demonstrate 5 degrees hip flexion 10 degrees hip abdct ROM.    Baseline lacks 5 degrees from neutral in both at eval; At visit 10, has 0 degrees flexion/extension; still lacking 5 degrees ABDCT neutral; 08/29/19: R hip: lacking 5 degrees of extension, 15 degrees abduction    Time 4    Period Weeks    Status Achieved    Target Date 06/25/19             PT Long Term Goals - 11/26/19 0957      PT LONG TERM GOAL #1   Title Pt to demonstrates 15 degrees Rt hip ABDCT, 10 degrees Rt hip extension P/ROM to facilitate prosthesis motor control.    Baseline 08/29/19: R hip: lacking 5 degrees of extension, 15 degrees  abduction, 10/15/19= R hip lacking 10 deg extension; 7/20 goal deferred to next session    Time 8    Period Weeks    Status On-going    Target Date 01/21/20      PT LONG TERM GOAL #2   Title Pt to demonstrate 5/5 strength hip extension at 0 degrees flexion/extension    Baseline has 5/5 in some ranges, but not in ranges specific to standing or gait.; 08/29/19: Unable to get to neutral flexion extension but 5/5 R hip extension in other ranges,  10/15/19= -3/5 right hip extension; 7/20 deferred to next session    Time 8    Period Weeks    Status On-going    Target Date 01/21/20      PT LONG TERM GOAL #3   Title Pt able to perfrom 5x STS from chair with RW, arms ad lib in <21sec    Baseline On 6/22 c RW and chair + 1 airex pad: 22.95sec (largely avoids use of RLE for power); 7/20, able to perform from standard WC height, 1:09seconds with BUE use.    Time 12    Period Weeks    Status On-going    Target Date 01/21/20      PT LONG TERM GOAL #4   Title Pt will be able to ambulate at least 100' with RW and supervision with proper locking of R prosthesis in order to demonstrate safe household ambulation in order to improve independence with gait without need for power wheelchair    Baseline Tolerating distance, but speed is inapropriate for distance for energy conservation and safety. Making progress, but needs more improvement.    Time 12    Period Weeks    Status On-going    Target Date 01/21/20      PT LONG TERM GOAL #5   Title Patient will continue to maintain a FOTO score of 52 or higher to indicate improvement from initial evaluation in functional activities.    Baseline initial score 48, on 7/20 score 55    Time 8    Period Weeks    Status New    Target Date 01/21/20                 Plan - 01/02/20 1020    Clinical Impression Statement Patient demonstrated excellent motivation during session today. Session was abbreviated today due to schedule mix-up. Patient required min-mod  VCs for correct positioning; Patient had increased difficulty with ambulation and getting RLE into extension due to hip flex tightness especially in standing. However, after cueing he took bigger steps with a good heel strike to lock out the knee. Patient demonstrates ability to perform strengthening exercises with moderate fatigue. Pt encouraged to follow-up as scheduled and continue home program. Patient will continue to benefit from continued skilled therapy in order to improve dynamic standing balance and increase strength in order to decrease fall.    Personal Factors and Comorbidities Age;Fitness;Past/Current Experience;Education;Transportation    Examination-Activity Limitations Transfers;Dressing;Squat;Stairs;Stand;Bed Mobility    Examination-Participation Restrictions Community Activity    Stability/Clinical Decision Making Stable/Uncomplicated    Rehab Potential Good    PT Frequency 2x / week    PT Duration 12 weeks    PT Treatment/Interventions ADLs/Self Care Home Management;Cryotherapy;Electrical Stimulation;DME Instruction;Gait training;Stair training;Functional mobility training;Therapeutic activities;Therapeutic exercise;Balance training;Neuromuscular re-education;Cognitive remediation;Patient/family education;Prosthetic Training;Wheelchair mobility training;Passive range of motion;Dry needling;Scar mobilization;Spinal Manipulations;Joint Manipulations    PT Next Visit Plan Targeted Rt hip extension strengthening in both end range flexion and end range extension ranges, hip rotation strengthening, standing balance adn postural control.    PT Home Exercise Plan Medbridge Access Code: [email or web address] (prone positioning), L single leg bridges, L sidelying R hip abduction, hip isometric adduction    Consulted and Agree with Plan of Care Patient           Patient will benefit from skilled therapeutic intervention in order to improve the following deficits and impairments:  Decreased activity  tolerance, Decreased balance, Decreased mobility, Decreased strength, Decreased cognition, Decreased knowledge of use of DME, Decreased knowledge of  precautions, Decreased endurance, Decreased range of motion, Difficulty walking, Decreased safety awareness, Decreased skin integrity, Decreased scar mobility, Hypomobility, Postural dysfunction, Increased edema, Increased muscle spasms, Increased fascial restricitons, Prosthetic Dependency, Impaired tone, Improper body mechanics  Visit Diagnosis: Difficulty in walking, not elsewhere classified  Unsteadiness on feet     Problem List Patient Active Problem List   Diagnosis Date Noted  . Cardiomyopathy ([place]) 01/22/2019  . CHF (congestive heart failure) ([place]) 01/22/2019  . Coronary disease 01/22/2019  . Above knee amputation of right lower extremity ([place]) 11/13/2018  . Leg ulcer, right, with fat layer exposed ([place]) 10/22/2018  . Cellulitis of right leg 07/21/2018  . Lower limb ulcer, calf, right, limited to breakdown of skin ([place]) 06/08/2018  . PVD (peripheral vascular disease) ([place]) 04/17/2018  . Malnutrition of moderate degree 04/11/2018  . Pressure injury of skin 04/06/2018  . Altered mental status 04/04/2018  . Hypothermia 04/04/2018  . Hemodialysis graft malfunction ([place]) 03/26/2018  . Hypercholesterolemia 02/27/2018  . Diabetes ([place]) 02/27/2018  . Weakness of right lower extremity 01/20/2018  . Fever   . Periumbilical abdominal pain   . Confusion 12/22/2017  . Acute delirium 12/21/2017  . Protein-calorie malnutrition, severe 12/19/2017  . Intractable nausea and vomiting 12/18/2017  . Lymphedema 12/13/2017  . Cellulitis 11/27/2017  . Chest pain 11/19/2017  . Atherosclerosis of native arteries of the extremities with ulceration ([place]) 11/07/2017  . Twitching 01/03/2017  . Elevated troponin 10/02/2015  . Complications, dialysis, catheter, mechanical ([place]) 10/02/2015  . Musculoskeletal chest pain 09/28/2015  . Chronic diastolic  CHF (congestive heart failure) ([place]) 09/28/2015  . End stage renal disease ([place]) 10/09/2012  . Hypertension 10/09/2012  . Gout 10/09/2012    Noemi Chapel, SPT This entire session was performed under direct supervision and direction of a licensed therapist/therapist assistant . I have personally read, edited and approve of the note as written.  Trotter,Margaret PT, DPT 01/02/2020, 3:43 PM  [place] MAIN [facility] SERVICES [address] [place], [place], [address] Phone: [phone]   Fax:  [phone]  Name: Dellas Guard. MRN: [id number] Date of Birth: [birth year]

## 2020-01-07 ENCOUNTER — Other Ambulatory Visit: Payer: Self-pay

## 2020-01-07 ENCOUNTER — Encounter: Payer: Self-pay | Admitting: Physical Therapy

## 2020-01-07 ENCOUNTER — Ambulatory Visit: Payer: Medicare Other | Admitting: Physical Therapy

## 2020-01-07 DIAGNOSIS — R262 Difficulty in walking, not elsewhere classified: Secondary | ICD-10-CM

## 2020-01-07 DIAGNOSIS — R2681 Unsteadiness on feet: Secondary | ICD-10-CM

## 2020-01-07 NOTE — Therapy (Signed)
[place] MAIN [facility] SERVICES [address] [place], [place], [address] Phone: [phone]   Fax:  [phone]  Physical Therapy Treatment Physical Therapy Progress Note   Dates of reporting period 11/27/19   to 01/07/20  Patient Details  Name: Johnny [name]. MRN: [id number] Date of Birth: [birth year] Referring Provider (PT): Eulogio Ditch, NP    Encounter Date: 01/07/2020   PT End of Session - 01/07/20 0912    Visit Number 50    Number of Visits 65    Date for PT Re-Evaluation 01/21/20    Authorization Type [employer] Medicare    Authorization Time Period 10/29/19-01/21/20: FOTO by PT (assessed 7/20 score 55)    PT Start Time 0905    PT Stop Time 1000    PT Time Calculation (min) 55 min    Equipment Utilized During Treatment Gait belt    Activity Tolerance Patient tolerated treatment well;No increased pain    Behavior During Therapy WFL for tasks assessed/performed           Past Medical History:  Diagnosis Date  . Anemia   . Anxiety   . CHF (congestive heart failure) ([place])   . Chronic kidney disease    esrd dialysis m/w/f  . Gout   . Hyperlipidemia   . Hypertension   . Myocardial infarction ([place]) 2010   10 years ago  . Neuromuscular disorder (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  . Peripheral vascular disease [facility])     Past Surgical History:  Procedure Laterality Date  . A/V FISTULAGRAM Right 09/06/2018   Procedure: A/V FISTULAGRAM;  Surgeon: Algernon Huxley, MD;  Location: [place] [facility];  Service: Cardiovascular;  Laterality: Right;  . A/V SHUNTOGRAM Left 06/21/2017   Procedure: A/V SHUNTOGRAM;  Surgeon: Katha Cabal, MD;  Location: [place] [facility];  Service: Cardiovascular;  Laterality: Left;  . A/V SHUNTOGRAM N/A 10/24/2018   Procedure: A/V SHUNTOGRAM;  Surgeon: Algernon Huxley, MD;  Location: [place] [facility];  Service: Cardiovascular;  Laterality: N/A;  . ABOVE KNEE LEG AMPUTATION Right 2020  .  AMPUTATION Right 10/25/2018   Procedure: AMPUTATION ABOVE KNEE;  Surgeon: Algernon Huxley, MD;  Location: [facility] OR[facility];  Service: General;  Laterality: Right;  . APPLICATION OF WOUND VAC Right 04/11/2018   Procedure: APPLICATION OF WOUND VAC;  Surgeon: Algernon Huxley, MD;  Location: [facility] OR[facility];  Service: Vascular;  Laterality: Right;  . AV FISTULA PLACEMENT Left 09/18/2015   Procedure: INSERTION OF ARTERIOVENOUS (AV) GORE-TEX GRAFT ARM ( BRACH/AXILLARY GRAFT W/ INSTANT STICK GRAFT );  Surgeon: Katha Cabal, MD;  Location: [facility];  Service: Vascular;  Laterality: Left;  . AV FISTULA PLACEMENT Right 07/19/2018   Procedure: INSERTION OF ARTERIOVENOUS (AV) GORE-TEX GRAFT ARM ( BRACHIAL AXILLARY);  Surgeon: Algernon Huxley, MD;  Location: [facility] OR[facility];  Service: Vascular;  Laterality: Right;  . DIALYSIS FISTULA CREATION Right 10/2017   right chest perm cath  . DIALYSIS/PERMA CATHETER REMOVAL N/A 09/13/2018   Procedure: DIALYSIS/PERMA CATHETER REMOVAL;  Surgeon: Algernon Huxley, MD;  Location: [place] [facility];  Service: Cardiovascular;  Laterality: N/A;  . ESOPHAGOGASTRODUODENOSCOPY N/A 12/19/2017   Procedure: ESOPHAGOGASTRODUODENOSCOPY (EGD);  Surgeon: Lin Landsman, MD;  Location: [facility] ENDOSCOPY;  Service: Gastroenterology;  Laterality: N/A;  . LOWER EXTREMITY ANGIOGRAPHY Left 11/16/2017   Procedure: LOWER EXTREMITY ANGIOGRAPHY;  Surgeon: Algernon Huxley, MD;  Location: [place] [facility];  Service: Cardiovascular;  Laterality: Left;  .  LOWER EXTREMITY ANGIOGRAPHY Right 01/18/2018   Procedure: LOWER EXTREMITY ANGIOGRAPHY;  Surgeon: Algernon Huxley, MD;  Location: [place] [facility];  Service: Cardiovascular;  Laterality: Right;  . LOWER EXTREMITY ANGIOGRAPHY Left 04/02/2018   Procedure: LOWER EXTREMITY ANGIOGRAPHY;  Surgeon: Algernon Huxley, MD;  Location: [place] [facility];  Service: Cardiovascular;  Laterality: Left;  . LOWER EXTREMITY ANGIOGRAPHY Right 04/09/2018   Procedure: Lower Extremity Angiography with  possible intervention;  Surgeon: Algernon Huxley, MD;  Location: [place] [facility];  Service: Cardiovascular;  Laterality: Right;  . LOWER EXTREMITY ANGIOGRAPHY Right 07/23/2018   Procedure: Lower Extremity Angiography;  Surgeon: Algernon Huxley, MD;  Location: [place] [facility];  Service: Cardiovascular;  Laterality: Right;  . LOWER EXTREMITY ANGIOGRAPHY Right 09/13/2018   Procedure: LOWER EXTREMITY ANGIOGRAPHY;  Surgeon: Algernon Huxley, MD;  Location: [place] [facility];  Service: Cardiovascular;  Laterality: Right;  . LOWER EXTREMITY VENOGRAPHY Right 09/13/2018   Procedure: LOWER EXTREMITY VENOGRAPHY;  Surgeon: Algernon Huxley, MD;  Location: [place] [facility];  Service: Cardiovascular;  Laterality: Right;  . PERIPHERAL VASCULAR CATHETERIZATION Left 09/01/2015   Procedure: A/V Shuntogram/Fistulagram;  Surgeon: Katha Cabal, MD;  Location: [place] [facility];  Service: Cardiovascular;  Laterality: Left;  . PERIPHERAL VASCULAR CATHETERIZATION N/A 09/30/2015   Procedure: A/V Shuntogram/Fistulagram with perm cathether removal;  Surgeon: Algernon Huxley, MD;  Location: [place] [facility];  Service: Cardiovascular;  Laterality: N/A;  . PERIPHERAL VASCULAR CATHETERIZATION Left 09/30/2015   Procedure: A/V Shunt Intervention;  Surgeon: Algernon Huxley, MD;  Location: [place] [facility];  Service: Cardiovascular;  Laterality: Left;  . PERIPHERAL VASCULAR CATHETERIZATION Left 12/03/2015   Procedure: Thrombectomy;  Surgeon: Algernon Huxley, MD;  Location: [place] [facility];  Service: Cardiovascular;  Laterality: Left;  . PERIPHERAL VASCULAR CATHETERIZATION Left 01/28/2016   Procedure: Thrombectomy;  Surgeon: Algernon Huxley, MD;  Location: [place] [facility];  Service: Cardiovascular;  Laterality: Left;  . PERIPHERAL VASCULAR CATHETERIZATION N/A 01/28/2016   Procedure: A/V Shuntogram/Fistulagram;  Surgeon: Algernon Huxley, MD;  Location: [place] [facility];  Service: Cardiovascular;  Laterality: N/A;  . SKIN SPLIT  GRAFT Right 05/24/2018   Procedure: SKIN GRAFT SPLIT THICKNESS ( RIGHT CALF);  Surgeon: Algernon Huxley, MD;  Location: [facility];  Service: Vascular;  Laterality: Right;  . UPPER EXTREMITY ANGIOGRAPHY  10/24/2018   Procedure: Upper Extremity Angiography;  Surgeon: Algernon Huxley, MD;  Location: [place] [facility];  Service: Cardiovascular;;  . UPPER EXTREMITY ANGIOGRAPHY Right 02/14/2019   Procedure: UPPER EXTREMITY ANGIOGRAPHY;  Surgeon: Algernon Huxley, MD;  Location: [place] [facility];  Service: Cardiovascular;  Laterality: Right;  . WOUND DEBRIDEMENT Right 04/11/2018   Procedure: DEBRIDEMENT WOUND calf muscle and skin;  Surgeon: Algernon Huxley, MD;  Location: [facility];  Service: Vascular;  Laterality: Right;    There were no vitals filed for this visit.   Subjective Assessment - 01/07/20 0911    Subjective Patient stated that he is good today. He notes that his new prosthetic will be coming in next week. Patient has no updates since last session. No other concerns or questions at this time.    Currently in Pain? No/denies    Pain Type Acute pain           Treatment: 5 x sit to stand and goals reviewed Nu-step x 5 mins L 4  Gait training with RW 100 feet x 2 with  cGA and RLE having difficulty with IR Transfer training sit to stand from power wc <> nu-step with CGA Transfer training from power wc <> stand with RW x 5 , cues for sequencing and safety Therapeutic exercise: Nu-step x 5 mins L3  Supine: SLR x 15 BLE [place] marching x 15  Sidelying: Hip abd x 15 , BLE    Patient performed with instruction, verbal cues, tactile cues of therapist: goal: increase tissue extensibility, promote proper posture, improve mobility                          PT Education - 01/07/20 0911    Education Details Gait safety, safety with transfers    Person(s) Educated Patient    Methods Explanation    Comprehension Verbalized understanding            PT Short Term Goals -  11/26/19 0956      PT SHORT TERM GOAL #1   Title Pt to demonstrate ability to perfrom STS transfer from chair c RW minGuard assist and lock his RLE knee joint without assist.    Baseline ModA from elevated surface at eval; 08/29/19: minA+1 with BUE support from regular height chair with arm rests; still requires elevated surface to perform. 7/20 able to perform STS from regular WC with RW and CGA, extended time needed to lock R knee joint and CGA for initialy steadying. highly reliant on UE    Time 4    Period Weeks    Status On-going    Target Date 12/24/19      PT SHORT TERM GOAL #2   Title Pt to demonstrate 5 degrees hip flexion 10 degrees hip abdct ROM.    Baseline lacks 5 degrees from neutral in both at eval; At visit 10, has 0 degrees flexion/extension; still lacking 5 degrees ABDCT neutral; 08/29/19: R hip: lacking 5 degrees of extension, 15 degrees abduction    Time 4    Period Weeks    Status Achieved    Target Date 06/25/19             PT Long Term Goals - 01/07/20 0935      PT LONG TERM GOAL #1   Title Pt to demonstrates 15 degrees Rt hip ABDCT, 10 degrees Rt hip extension P/ROM to facilitate prosthesis motor control.    Baseline 08/29/19: R hip: lacking 5 degrees of extension, 15 degrees abduction, 10/15/19= R hip lacking 10 deg extension; 7/20 goal deferred to next session  01/07/20+R hip lacking 10 deg extension    Time 8    Period Weeks    Status On-going    Target Date 01/21/20      PT LONG TERM GOAL #2   Title Pt to demonstrate 5/5 strength hip extension at 0 degrees flexion/extension    Baseline has 5/5 in some ranges, but not in ranges specific to standing or gait.; 08/29/19: Unable to get to neutral flexion extension but 5/5 R hip extension in other ranges, 10/15/19= -3/5 right hip extension; 7/20 deferred to next session8/31/21=-3/5 right hip extension    Time 8    Period Weeks    Status On-going    Target Date 01/21/20      PT LONG TERM GOAL #3   Title Pt able  to perfrom 5x STS from chair with RW, arms ad lib in <21sec    Baseline On 6/22 c RW and chair + 1 airex pad: 22.95sec (largely avoids  use of RLE for power); 7/20, able to perform from standard WC height, 1:09seconds with BUE use.01/07/20=    Time 12    Period Weeks    Status On-going    Target Date 01/21/20      PT LONG TERM GOAL #4   Title Pt will be able to ambulate at least 100' with RW and supervision with proper locking of R prosthesis in order to demonstrate safe household ambulation in order to improve independence with gait without need for power wheelchair    Baseline Tolerating distance, but speed is inapropriate for distance for energy conservation and safety. Making progress, but needs more improvement.01/07/20= Patient has B hip flex and R knee flex during ambulation 100 feet with RLE buckling x 1 and min assisst with RW    Time 12    Period Weeks    Status On-going    Target Date 01/21/20      PT LONG TERM GOAL #5   Title Patient will continue to maintain a FOTO score of 52 or higher to indicate improvement from initial evaluation in functional activities.    Baseline initial score 48, on 7/20 score 55, 01/07/20=40.02 sec    Time 8    Period Weeks    Status On-going    Target Date 01/21/20                 Plan - 01/07/20 0913    Clinical Impression Statement Patient's condition has the potential to improve in response to therapy. Maximum improvement is yet to be obtained. The anticipated improvement is attainable and reasonable in a generally predictable time.  Patient reports that his functional mobility depends on how active he is during the day. His outcome measures improved and his goals were reviewed and progressed. Patient will continue to benefit from skilled PT to improve mobility.Patient instructed in intermediate mobility challenges, transfer training and safety training. Patient required min-mod VCs for correct positioning; Patient had increased difficulty with  gait distance challenges and small turns and movements especially in standing.  Patient would benefit from additional skilled PT intervention to improve strength, balance.   Personal Factors and Comorbidities Age;Fitness;Past/Current Experience;Education;Transportation    Examination-Activity Limitations Transfers;Dressing;Squat;Stairs;Stand;Bed Mobility    Examination-Participation Restrictions Community Activity    Stability/Clinical Decision Making Stable/Uncomplicated    Rehab Potential Good    PT Frequency 2x / week    PT Duration 12 weeks    PT Treatment/Interventions ADLs/Self Care Home Management;Cryotherapy;Electrical Stimulation;DME Instruction;Gait training;Stair training;Functional mobility training;Therapeutic activities;Therapeutic exercise;Balance training;Neuromuscular re-education;Cognitive remediation;Patient/family education;Prosthetic Training;Wheelchair mobility training;Passive range of motion;Dry needling;Scar mobilization;Spinal Manipulations;Joint Manipulations    PT Next Visit Plan Targeted Rt hip extension strengthening in both end range flexion and end range extension ranges, hip rotation strengthening, standing balance adn postural control.    PT Home Exercise Plan Medbridge Access Code: [email or web address] (prone positioning), L single leg bridges, L sidelying R hip abduction, hip isometric adduction    Consulted and Agree with Plan of Care Patient           Patient will benefit from skilled therapeutic intervention in order to improve the following deficits and impairments:  Decreased activity tolerance, Decreased balance, Decreased mobility, Decreased strength, Decreased cognition, Decreased knowledge of use of DME, Decreased knowledge of precautions, Decreased endurance, Decreased range of motion, Difficulty walking, Decreased safety awareness, Decreased skin integrity, Decreased scar mobility, Hypomobility, Postural dysfunction, Increased edema, Increased muscle spasms,  Increased fascial restricitons, Prosthetic Dependency, Impaired tone, Improper body mechanics  Visit Diagnosis: Difficulty  in walking, not elsewhere classified  Unsteadiness on feet     Problem List Patient Active Problem List   Diagnosis Date Noted  . Cardiomyopathy ([place]) 01/22/2019  . CHF (congestive heart failure) ([place]) 01/22/2019  . Coronary disease 01/22/2019  . Above knee amputation of right lower extremity ([place]) 11/13/2018  . Leg ulcer, right, with fat layer exposed ([place]) 10/22/2018  . Cellulitis of right leg 07/21/2018  . Lower limb ulcer, calf, right, limited to breakdown of skin ([place]) 06/08/2018  . PVD (peripheral vascular disease) ([place]) 04/17/2018  . Malnutrition of moderate degree 04/11/2018  . Pressure injury of skin 04/06/2018  . Altered mental status 04/04/2018  . Hypothermia 04/04/2018  . Hemodialysis graft malfunction ([place]) 03/26/2018  . Hypercholesterolemia 02/27/2018  . Diabetes ([place]) 02/27/2018  . Weakness of right lower extremity 01/20/2018  . Fever   . Periumbilical abdominal pain   . Confusion 12/22/2017  . Acute delirium 12/21/2017  . Protein-calorie malnutrition, severe 12/19/2017  . Intractable nausea and vomiting 12/18/2017  . Lymphedema 12/13/2017  . Cellulitis 11/27/2017  . Chest pain 11/19/2017  . Atherosclerosis of native arteries of the extremities with ulceration ([place]) 11/07/2017  . Twitching 01/03/2017  . Elevated troponin 10/02/2015  . Complications, dialysis, catheter, mechanical ([place]) 10/02/2015  . Musculoskeletal chest pain 09/28/2015  . Chronic diastolic CHF (congestive heart failure) ([place]) 09/28/2015  . End stage renal disease ([place]) 10/09/2012  . Hypertension 10/09/2012  . Gout 10/09/2012    Alanson Puls , PT DPT 01/07/2020, 10:10 AM  [place] MAIN [facility] SERVICES [address] [place], [place], [address] Phone: [phone]   Fax:  [phone]  Name: Johnny [name]. MRN:  [id number] Date of Birth: March 26, 1951

## 2020-01-09 ENCOUNTER — Ambulatory Visit: Payer: Medicare Other | Attending: Nurse Practitioner

## 2020-01-09 ENCOUNTER — Other Ambulatory Visit: Payer: Self-pay

## 2020-01-09 DIAGNOSIS — R262 Difficulty in walking, not elsewhere classified: Secondary | ICD-10-CM

## 2020-01-09 DIAGNOSIS — R2681 Unsteadiness on feet: Secondary | ICD-10-CM | POA: Diagnosis present

## 2020-01-09 NOTE — Therapy (Signed)
[place] MAIN [facility] SERVICES [address] [place], [place], [address] Phone: [phone]   Fax:  [phone]  Physical Therapy Treatment  Patient Details  Name: Johnny [name]. MRN: [id number] Date of Birth: 1951-05-01 Referring Provider (PT): Eulogio Ditch, NP    Encounter Date: 01/09/2020   PT End of Session - 01/09/20 1026    Visit Number 51    Number of Visits 65    Date for PT Re-Evaluation 01/21/20    Authorization Type [employer] Medicare    Authorization Time Period 10/29/19-01/21/20: FOTO by PT (assessed 7/20 score 55)    PT Start Time 0932    PT Stop Time 1012    PT Time Calculation (min) 40 min    Equipment Utilized During Treatment Gait belt    Activity Tolerance Patient tolerated treatment well;No increased pain    Behavior During Therapy WFL for tasks assessed/performed           Past Medical History:  Diagnosis Date  . Anemia   . Anxiety   . CHF (congestive heart failure) ([place])   . Chronic kidney disease    esrd dialysis m/w/f  . Gout   . Hyperlipidemia   . Hypertension   . Myocardial infarction ([place]) 2010   10 years ago  . 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  . Peripheral vascular disease [facility])     Past Surgical History:  Procedure Laterality Date  . A/V FISTULAGRAM Right 09/06/2018   Procedure: A/V FISTULAGRAM;  Surgeon: Algernon Huxley, MD;  Location: [place] [facility];  Service: Cardiovascular;  Laterality: Right;  . A/V SHUNTOGRAM Left 06/21/2017   Procedure: A/V SHUNTOGRAM;  Surgeon: Katha Cabal, MD;  Location: [place] [facility];  Service: Cardiovascular;  Laterality: Left;  . A/V SHUNTOGRAM N/A 10/24/2018   Procedure: A/V SHUNTOGRAM;  Surgeon: Algernon Huxley, MD;  Location: [place] [facility];  Service: Cardiovascular;  Laterality: N/A;  . ABOVE KNEE LEG AMPUTATION Right 2020  . AMPUTATION Right 10/25/2018   Procedure: AMPUTATION ABOVE KNEE;  Surgeon: Algernon Huxley,  MD;  Location: [facility] ORS;  Service: General;  Laterality: Right;  . APPLICATION OF WOUND VAC Right 04/11/2018   Procedure: APPLICATION OF WOUND VAC;  Surgeon: Algernon Huxley, MD;  Location: [facility] OR[facility];  Service: Vascular;  Laterality: Right;  . AV FISTULA PLACEMENT Left 09/18/2015   Procedure: INSERTION OF ARTERIOVENOUS (AV) GORE-TEX GRAFT ARM ( BRACH/AXILLARY GRAFT W/ INSTANT STICK GRAFT );  Surgeon: Katha Cabal, MD;  Location: [facility];  Service: Vascular;  Laterality: Left;  . AV FISTULA PLACEMENT Right 07/19/2018   Procedure: INSERTION OF ARTERIOVENOUS (AV) GORE-TEX GRAFT ARM ( BRACHIAL AXILLARY);  Surgeon: Algernon Huxley, MD;  Location: [facility] OR[facility];  Service: Vascular;  Laterality: Right;  . DIALYSIS FISTULA CREATION Right 10/2017   right chest perm cath  . DIALYSIS/PERMA CATHETER REMOVAL N/A 09/13/2018   Procedure: DIALYSIS/PERMA CATHETER REMOVAL;  Surgeon: Algernon Huxley, MD;  Location: [place] [facility];  Service: Cardiovascular;  Laterality: N/A;  . ESOPHAGOGASTRODUODENOSCOPY N/A 12/19/2017   Procedure: ESOPHAGOGASTRODUODENOSCOPY (EGD);  Surgeon: Lin Landsman, MD;  Location: [facility] ENDOSCOPY;  Service: Gastroenterology;  Laterality: N/A;  . LOWER EXTREMITY ANGIOGRAPHY Left 11/16/2017   Procedure: LOWER EXTREMITY ANGIOGRAPHY;  Surgeon: Algernon Huxley, MD;  Location: [place] [facility];  Service: Cardiovascular;  Laterality: Left;  . LOWER EXTREMITY ANGIOGRAPHY Right 01/18/2018   Procedure: LOWER EXTREMITY ANGIOGRAPHY;  Surgeon:  Algernon Huxley, MD;  Location: [place] [facility];  Service: Cardiovascular;  Laterality: Right;  . LOWER EXTREMITY ANGIOGRAPHY Left 04/02/2018   Procedure: LOWER EXTREMITY ANGIOGRAPHY;  Surgeon: Algernon Huxley, MD;  Location: [place] [facility];  Service: Cardiovascular;  Laterality: Left;  . LOWER EXTREMITY ANGIOGRAPHY Right 04/09/2018   Procedure: Lower Extremity Angiography with possible intervention;  Surgeon: Algernon Huxley, MD;  Location: [place] [facility];   Service: Cardiovascular;  Laterality: Right;  . LOWER EXTREMITY ANGIOGRAPHY Right 07/23/2018   Procedure: Lower Extremity Angiography;  Surgeon: Algernon Huxley, MD;  Location: [place] [facility];  Service: Cardiovascular;  Laterality: Right;  . LOWER EXTREMITY ANGIOGRAPHY Right 09/13/2018   Procedure: LOWER EXTREMITY ANGIOGRAPHY;  Surgeon: Algernon Huxley, MD;  Location: [place] [facility];  Service: Cardiovascular;  Laterality: Right;  . LOWER EXTREMITY VENOGRAPHY Right 09/13/2018   Procedure: LOWER EXTREMITY VENOGRAPHY;  Surgeon: Algernon Huxley, MD;  Location: [place] [facility];  Service: Cardiovascular;  Laterality: Right;  . PERIPHERAL VASCULAR CATHETERIZATION Left 09/01/2015   Procedure: A/V Shuntogram/Fistulagram;  Surgeon: Katha Cabal, MD;  Location: [place] [facility];  Service: Cardiovascular;  Laterality: Left;  . PERIPHERAL VASCULAR CATHETERIZATION N/A 09/30/2015   Procedure: A/V Shuntogram/Fistulagram with perm cathether removal;  Surgeon: Algernon Huxley, MD;  Location: [place] [facility];  Service: Cardiovascular;  Laterality: N/A;  . PERIPHERAL VASCULAR CATHETERIZATION Left 09/30/2015   Procedure: A/V Shunt Intervention;  Surgeon: Algernon Huxley, MD;  Location: [place] [facility];  Service: Cardiovascular;  Laterality: Left;  . PERIPHERAL VASCULAR CATHETERIZATION Left 12/03/2015   Procedure: Thrombectomy;  Surgeon: Algernon Huxley, MD;  Location: [place] [facility];  Service: Cardiovascular;  Laterality: Left;  . PERIPHERAL VASCULAR CATHETERIZATION Left 01/28/2016   Procedure: Thrombectomy;  Surgeon: Algernon Huxley, MD;  Location: [place] [facility];  Service: Cardiovascular;  Laterality: Left;  . PERIPHERAL VASCULAR CATHETERIZATION N/A 01/28/2016   Procedure: A/V Shuntogram/Fistulagram;  Surgeon: Algernon Huxley, MD;  Location: [place] [facility];  Service: Cardiovascular;  Laterality: N/A;  . SKIN SPLIT GRAFT Right 05/24/2018   Procedure: SKIN GRAFT SPLIT THICKNESS ( RIGHT CALF);  Surgeon:  Algernon Huxley, MD;  Location: [facility];  Service: Vascular;  Laterality: Right;  . UPPER EXTREMITY ANGIOGRAPHY  10/24/2018   Procedure: Upper Extremity Angiography;  Surgeon: Algernon Huxley, MD;  Location: [place] [facility];  Service: Cardiovascular;;  . UPPER EXTREMITY ANGIOGRAPHY Right 02/14/2019   Procedure: UPPER EXTREMITY ANGIOGRAPHY;  Surgeon: Algernon Huxley, MD;  Location: [place] [facility];  Service: Cardiovascular;  Laterality: Right;  . WOUND DEBRIDEMENT Right 04/11/2018   Procedure: DEBRIDEMENT WOUND calf muscle and skin;  Surgeon: Algernon Huxley, MD;  Location: [facility] OR[facility];  Service: Vascular;  Laterality: Right;    There were no vitals filed for this visit.   Subjective Assessment - 01/09/20 1026    Subjective Pt doing well this date. No updates since last visit.    Pertinent History Pt underwent Rt AKA amputation in June 2020 after difficulty with wound healing/infection. Pt DC hospital to SNF for rehab. He has since been back at home. Pt uses a manual WC or power chair for daily mobility. Pt takes tranportation services to HD MWF. Pt was recently fitted for his AKA prosthesis in Jan 2021.    Limitations Standing;Walking;House hold activities    Currently in Pain? No/denies           INTERVENTION  THIS DATE:  *squat pivot transfer power chair to table  Supine mobility work  PROM stretching  -SKTC c contralateral hip extension overpressure 3x30sec bilat -short adductor stretch 6x30sec various positions (SKTC + horizontal Abduction; neutral flexion with abduction/extension) -Manual release to adductor brevis/pectineus x 3-4 minutes  -Bridging in hooklying 2x10 (noted improvement in strength and RLE control of hip)  -Manually resisted hip extension (from end-range flexion) RLE 3x12 -Supine RLE heel slides to marching 3x12 (author assists with prosthetic to allow smooth, full ROM  -supine RLE manually resisted hip ABDCT 2x10 (difficulty performing 2/2 adductor tightness, tends to  use hip flexors despite cues)  -STS from elevated plinth 2x10, RW supported arms; cues and mina to maintain RLE symmetrical with LLE; pt tends to avoid functional loading of RLE for transfers due to continued weakness and motor habits.  -sustained standing, lumbar/thoracic extension over plinth (pt blocked at S2) 2x10x3secH, author provides Rt prosthesis knee block (minGuard assist) due to tightness in Rt hip flexors.      PT Short Term Goals - 11/26/19 0956      PT SHORT TERM GOAL #1   Title Pt to demonstrate ability to perfrom STS transfer from chair c RW minGuard assist and lock his RLE knee joint without assist.    Baseline ModA from elevated surface at eval; 08/29/19: minA+1 with BUE support from regular height chair with arm rests; still requires elevated surface to perform. 7/20 able to perform STS from regular WC with RW and CGA, extended time needed to lock R knee joint and CGA for initialy steadying. highly reliant on UE    Time 4    Period Weeks    Status On-going    Target Date 12/24/19      PT SHORT TERM GOAL #2   Title Pt to demonstrate 5 degrees hip flexion 10 degrees hip abdct ROM.    Baseline lacks 5 degrees from neutral in both at eval; At visit 10, has 0 degrees flexion/extension; still lacking 5 degrees ABDCT neutral; 08/29/19: R hip: lacking 5 degrees of extension, 15 degrees abduction    Time 4    Period Weeks    Status Achieved    Target Date 06/25/19             PT Long Term Goals - 01/07/20 0935      PT LONG TERM GOAL #1   Title Pt to demonstrates 15 degrees Rt hip ABDCT, 10 degrees Rt hip extension P/ROM to facilitate prosthesis motor control.    Baseline 08/29/19: R hip: lacking 5 degrees of extension, 15 degrees abduction, 10/15/19= R hip lacking 10 deg extension; 7/20 goal deferred to next session  01/07/20+R hip lacking 10 deg extension    Time 8    Period Weeks    Status On-going    Target Date 01/21/20      PT LONG TERM GOAL #2   Title Pt to  demonstrate 5/5 strength hip extension at 0 degrees flexion/extension    Baseline has 5/5 in some ranges, but not in ranges specific to standing or gait.; 08/29/19: Unable to get to neutral flexion extension but 5/5 R hip extension in other ranges, 10/15/19= -3/5 right hip extension; 7/20 deferred to next session8/31/21=-3/5 right hip extension    Time 8    Period Weeks    Status On-going    Target Date 01/21/20      PT LONG TERM GOAL #3   Title Pt able to perfrom 5x STS from chair  with RW, arms ad lib in <21sec    Baseline On 6/22 c RW and chair + 1 airex pad: 22.95sec (largely avoids use of RLE for power); 7/20, able to perform from standard WC height, 1:09seconds with BUE use.01/07/20=    Time 12    Period Weeks    Status On-going    Target Date 01/21/20      PT LONG TERM GOAL #4   Title Pt will be able to ambulate at least 100' with RW and supervision with proper locking of R prosthesis in order to demonstrate safe household ambulation in order to improve independence with gait without need for power wheelchair    Baseline Tolerating distance, but speed is inapropriate for distance for energy conservation and safety. Making progress, but needs more improvement.01/07/20= Patient has B hip flex and R knee flex during ambulation 100 feet with RLE buckling x 1 and min assisst with RW    Time 12    Period Weeks    Status On-going    Target Date 01/21/20      PT LONG TERM GOAL #5   Title Patient will continue to maintain a FOTO score of 52 or higher to indicate improvement from initial evaluation in functional activities.    Baseline initial score 48, on 7/20 score 55, 01/07/20=40.02 sec    Time 8    Period Weeks    Status On-going    Target Date 01/21/20                 Plan - 01/09/20 1032    Clinical Impression Statement Continued with moblity work this date. Pt continues to have tightness in short adductors (brevis, pectineus) which continue to limit hip flexion, extension, and  ABD ROM. Author provides manual resistance in supine for hip strengthening in very specific ranges that remain weak and critical for success in transfers, gait, and standing. Pt shows improved strength in hip extension wth exercises, but remains too weak to significantly change motor patterns for transfers.    Stability/Clinical Decision Making Stable/Uncomplicated    Clinical Decision Making Low    Rehab Potential Good    PT Frequency 2x / week    PT Duration 12 weeks    PT Treatment/Interventions ADLs/Self Care Home Management;Cryotherapy;Electrical Stimulation;DME Instruction;Gait training;Stair training;Functional mobility training;Therapeutic activities;Therapeutic exercise;Balance training;Neuromuscular re-education;Cognitive remediation;Patient/family education;Prosthetic Training;Wheelchair mobility training;Passive range of motion;Dry needling;Scar mobilization;Spinal Manipulations;Joint Manipulations    PT Next Visit Plan Targeted Rt hip extension strengthening in both end range flexion and end range extension ranges, hip rotation strengthening, standing balance adn postural control.    PT Home Exercise Plan Medbridge Access Code: [email or web address] (prone positioning), L single leg bridges, L sidelying R hip abduction, hip isometric adduction    Consulted and Agree with Plan of Care Patient           Patient will benefit from skilled therapeutic intervention in order to improve the following deficits and impairments:  Decreased activity tolerance, Decreased balance, Decreased mobility, Decreased strength, Decreased cognition, Decreased knowledge of use of DME, Decreased knowledge of precautions, Decreased endurance, Decreased range of motion, Difficulty walking, Decreased safety awareness, Decreased skin integrity, Decreased scar mobility, Hypomobility, Postural dysfunction, Increased edema, Increased muscle spasms, Increased fascial restricitons, Prosthetic Dependency, Impaired tone, Improper body  mechanics  Visit Diagnosis: Difficulty in walking, not elsewhere classified  Unsteadiness on feet     Problem List Patient Active Problem List   Diagnosis Date Noted  . Cardiomyopathy ([place]) 01/22/2019  . CHF (congestive heart  failure) ([place]) 01/22/2019  . Coronary disease 01/22/2019  . Above knee amputation of right lower extremity ([place]) 11/13/2018  . Leg ulcer, right, with fat layer exposed ([place]) 10/22/2018  . Cellulitis of right leg 07/21/2018  . Lower limb ulcer, calf, right, limited to breakdown of skin ([place]) 06/08/2018  . PVD (peripheral vascular disease) ([place]) 04/17/2018  . Malnutrition of moderate degree 04/11/2018  . Pressure injury of skin 04/06/2018  . Altered mental status 04/04/2018  . Hypothermia 04/04/2018  . Hemodialysis graft malfunction ([place]) 03/26/2018  . Hypercholesterolemia 02/27/2018  . Diabetes ([place]) 02/27/2018  . Weakness of right lower extremity 01/20/2018  . Fever   . Periumbilical abdominal pain   . Confusion 12/22/2017  . Acute delirium 12/21/2017  . Protein-calorie malnutrition, severe 12/19/2017  . Intractable nausea and vomiting 12/18/2017  . Lymphedema 12/13/2017  . Cellulitis 11/27/2017  . Chest pain 11/19/2017  . Atherosclerosis of native arteries of the extremities with ulceration ([place]) 11/07/2017  . Twitching 01/03/2017  . Elevated troponin 10/02/2015  . Complications, dialysis, catheter, mechanical ([place]) 10/02/2015  . Musculoskeletal chest pain 09/28/2015  . Chronic diastolic CHF (congestive heart failure) ([place]) 09/28/2015  . End stage renal disease ([place]) 10/09/2012  . Hypertension 10/09/2012  . Gout 10/09/2012   10:42 AM, 01/09/20 Etta Grandchild, PT, DPT Physical Therapist - [facility]  Outpatient Physical Therapy- [place] [phone]    Etta Grandchild 01/09/2020, 10:41 AM  [place] MAIN [facility] SERVICES [address] [place], [place],  [address] Phone: [phone]   Fax:  [phone]  Name: Johnny [name]. MRN: [id number] Date of Birth: Nov 11, 1950

## 2020-01-14 ENCOUNTER — Ambulatory Visit: Payer: Medicare Other

## 2020-01-14 ENCOUNTER — Other Ambulatory Visit: Payer: Self-pay

## 2020-01-14 DIAGNOSIS — R262 Difficulty in walking, not elsewhere classified: Secondary | ICD-10-CM

## 2020-01-14 DIAGNOSIS — R2681 Unsteadiness on feet: Secondary | ICD-10-CM

## 2020-01-14 NOTE — Therapy (Signed)
[place] MAIN [facility] SERVICES [address] [place], [place], [address] Phone: [phone]   Fax:  [phone]  Physical Therapy Treatment  Patient Details  Name: Johnny [name]. MRN: [id number] Date of Birth: [birth year] Referring Provider (PT): Eulogio Ditch, NP    Encounter Date: 01/14/2020   PT End of Session - 01/14/20 0909    Visit Number 52    Number of Visits 65    Date for PT Re-Evaluation 01/21/20    Authorization Type [employer] Medicare    Authorization Time Period 10/29/19-01/21/20: FOTO by PT (assessed 7/20 score 55)    PT Start Time 0901    PT Stop Time 0945    PT Time Calculation (min) 44 min    Equipment Utilized During Treatment Gait belt    Activity Tolerance Patient tolerated treatment well;No increased pain    Behavior During Therapy WFL for tasks assessed/performed           Past Medical History:  Diagnosis Date  . Anemia   . Anxiety   . CHF (congestive heart failure) ([place])   . Chronic kidney disease    esrd dialysis m/w/f  . Gout   . Hyperlipidemia   . Hypertension   . Myocardial infarction ([place]) 2010   10 years ago  . 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  . Peripheral vascular disease [facility])     Past Surgical History:  Procedure Laterality Date  . A/V FISTULAGRAM Right 09/06/2018   Procedure: A/V FISTULAGRAM;  Surgeon: Algernon Huxley, MD;  Location: [place] [facility];  Service: Cardiovascular;  Laterality: Right;  . A/V SHUNTOGRAM Left 06/21/2017   Procedure: A/V SHUNTOGRAM;  Surgeon: Katha Cabal, MD;  Location: [place] CV LAB;  Service: Cardiovascular;  Laterality: Left;  . A/V SHUNTOGRAM N/A 10/24/2018   Procedure: A/V SHUNTOGRAM;  Surgeon: Algernon Huxley, MD;  Location: [place] CV LAB;  Service: Cardiovascular;  Laterality: N/A;  . ABOVE KNEE LEG AMPUTATION Right 2020  . AMPUTATION Right 10/25/2018   Procedure: AMPUTATION ABOVE KNEE;  Surgeon: Algernon Huxley,  MD;  Location: [facility] ORS;  Service: General;  Laterality: Right;  . APPLICATION OF WOUND VAC Right 04/11/2018   Procedure: APPLICATION OF WOUND VAC;  Surgeon: Algernon Huxley, MD;  Location: [facility] ORS;  Service: Vascular;  Laterality: Right;  . AV FISTULA PLACEMENT Left 09/18/2015   Procedure: INSERTION OF ARTERIOVENOUS (AV) GORE-TEX GRAFT ARM ( BRACH/AXILLARY GRAFT W/ INSTANT STICK GRAFT );  Surgeon: Katha Cabal, MD;  Location: [facility] ORS;  Service: Vascular;  Laterality: Left;  . AV FISTULA PLACEMENT Right 07/19/2018   Procedure: INSERTION OF ARTERIOVENOUS (AV) GORE-TEX GRAFT ARM ( BRACHIAL AXILLARY);  Surgeon: Algernon Huxley, MD;  Location: [facility] ORS;  Service: Vascular;  Laterality: Right;  . DIALYSIS FISTULA CREATION Right 10/2017   right chest perm cath  . DIALYSIS/PERMA CATHETER REMOVAL N/A 09/13/2018   Procedure: DIALYSIS/PERMA CATHETER REMOVAL;  Surgeon: Algernon Huxley, MD;  Location: [place] CV LAB;  Service: Cardiovascular;  Laterality: N/A;  . ESOPHAGOGASTRODUODENOSCOPY N/A 12/19/2017   Procedure: ESOPHAGOGASTRODUODENOSCOPY (EGD);  Surgeon: Lin Landsman, MD;  Location: [facility] ENDOSCOPY;  Service: Gastroenterology;  Laterality: N/A;  . LOWER EXTREMITY ANGIOGRAPHY Left 11/16/2017   Procedure: LOWER EXTREMITY ANGIOGRAPHY;  Surgeon: Algernon Huxley, MD;  Location: [place] CV LAB;  Service: Cardiovascular;  Laterality: Left;  . LOWER EXTREMITY ANGIOGRAPHY Right 01/18/2018   Procedure: LOWER EXTREMITY ANGIOGRAPHY;  Surgeon:  Algernon Huxley, MD;  Location: [place] CV LAB;  Service: Cardiovascular;  Laterality: Right;  . LOWER EXTREMITY ANGIOGRAPHY Left 04/02/2018   Procedure: LOWER EXTREMITY ANGIOGRAPHY;  Surgeon: Algernon Huxley, MD;  Location: [place] CV LAB;  Service: Cardiovascular;  Laterality: Left;  . LOWER EXTREMITY ANGIOGRAPHY Right 04/09/2018   Procedure: Lower Extremity Angiography with possible intervention;  Surgeon: Algernon Huxley, MD;  Location: [place] CV LAB;   Service: Cardiovascular;  Laterality: Right;  . LOWER EXTREMITY ANGIOGRAPHY Right 07/23/2018   Procedure: Lower Extremity Angiography;  Surgeon: Algernon Huxley, MD;  Location: [place] CV LAB;  Service: Cardiovascular;  Laterality: Right;  . LOWER EXTREMITY ANGIOGRAPHY Right 09/13/2018   Procedure: LOWER EXTREMITY ANGIOGRAPHY;  Surgeon: Algernon Huxley, MD;  Location: [place] CV LAB;  Service: Cardiovascular;  Laterality: Right;  . LOWER EXTREMITY VENOGRAPHY Right 09/13/2018   Procedure: LOWER EXTREMITY VENOGRAPHY;  Surgeon: Algernon Huxley, MD;  Location: [place] CV LAB;  Service: Cardiovascular;  Laterality: Right;  . PERIPHERAL VASCULAR CATHETERIZATION Left 09/01/2015   Procedure: A/V Shuntogram/Fistulagram;  Surgeon: Katha Cabal, MD;  Location: [place] CV LAB;  Service: Cardiovascular;  Laterality: Left;  . PERIPHERAL VASCULAR CATHETERIZATION N/A 09/30/2015   Procedure: A/V Shuntogram/Fistulagram with perm cathether removal;  Surgeon: Algernon Huxley, MD;  Location: [place] CV LAB;  Service: Cardiovascular;  Laterality: N/A;  . PERIPHERAL VASCULAR CATHETERIZATION Left 09/30/2015   Procedure: A/V Shunt Intervention;  Surgeon: Algernon Huxley, MD;  Location: [place] CV LAB;  Service: Cardiovascular;  Laterality: Left;  . PERIPHERAL VASCULAR CATHETERIZATION Left 12/03/2015   Procedure: Thrombectomy;  Surgeon: Algernon Huxley, MD;  Location: [place] CV LAB;  Service: Cardiovascular;  Laterality: Left;  . PERIPHERAL VASCULAR CATHETERIZATION Left 01/28/2016   Procedure: Thrombectomy;  Surgeon: Algernon Huxley, MD;  Location: [place] CV LAB;  Service: Cardiovascular;  Laterality: Left;  . PERIPHERAL VASCULAR CATHETERIZATION N/A 01/28/2016   Procedure: A/V Shuntogram/Fistulagram;  Surgeon: Algernon Huxley, MD;  Location: [place] CV LAB;  Service: Cardiovascular;  Laterality: N/A;  . SKIN SPLIT GRAFT Right 05/24/2018   Procedure: SKIN GRAFT SPLIT THICKNESS ( RIGHT CALF);  Surgeon:  Algernon Huxley, MD;  Location: [facility] ORS;  Service: Vascular;  Laterality: Right;  . UPPER EXTREMITY ANGIOGRAPHY  10/24/2018   Procedure: Upper Extremity Angiography;  Surgeon: Algernon Huxley, MD;  Location: [place] CV LAB;  Service: Cardiovascular;;  . UPPER EXTREMITY ANGIOGRAPHY Right 02/14/2019   Procedure: UPPER EXTREMITY ANGIOGRAPHY;  Surgeon: Algernon Huxley, MD;  Location: [place] CV LAB;  Service: Cardiovascular;  Laterality: Right;  . WOUND DEBRIDEMENT Right 04/11/2018   Procedure: DEBRIDEMENT WOUND calf muscle and skin;  Surgeon: Algernon Huxley, MD;  Location: [facility] ORS;  Service: Vascular;  Laterality: Right;    There were no vitals filed for this visit.   Subjective Assessment - 01/14/20 0908    Subjective Pt has no complaints, doing well this session    Pertinent History Pt underwent Rt AKA amputation in June 2020 after difficulty with wound healing/infection. Pt DC hospital to SNF for rehab. He has since been back at home. Pt uses a manual WC or power chair for daily mobility. Pt takes tranportation services to HD MWF. Pt was recently fitted for his AKA prosthesis in Jan 2021.    Limitations Standing;Walking;House hold activities    How long can you sit comfortably? No difficulty    How long can you stand comfortably?  1 minute    How long can you walk comfortably? unable           INTERVENTION THIS DATE:  TREATMENT: Squat pivot transfer to nustep, pt very motivated to use nustep feels its helpful, enjoys it x31mins at level 3, >60 SPM Ambulation with bariatric walker  Supine mobility work:  PROM stretching  -SKTC c contralateral hip extension overpressure 3x30sec bilat Hip flexor stretch bilaterall 3x45seconds Leg extension isometrics in near neutral position as able x10 ea LE    ambulation with bariatric RW x31minutes at patient request, CGA with close chair follow. Cues for heel strike, RW placement.  Pt response/clinical impression: With fatigue the patient  demonstrated decreased ability to lock R knee in reasonable time frame, verbal cues needed, but pt remains highly motivated to continue with therapy. Pt also demonstrated decreased ability to achieve neutral hip positioning in supine due to increased hip flexor tension, would benefit from further skilled PT to continue to progress towards goals.        PT Education - 01/14/20 0908    Education Details gait safety, safety with transfers    Person(s) Educated Patient    Methods Explanation;Demonstration;Tactile cues;Verbal cues    Comprehension Verbalized understanding;Returned demonstration;Verbal cues required;Tactile cues required            PT Short Term Goals - 11/26/19 0956      PT SHORT TERM GOAL #1   Title Pt to demonstrate ability to perfrom STS transfer from chair c RW minGuard assist and lock his RLE knee joint without assist.    Baseline ModA from elevated surface at eval; 08/29/19: minA+1 with BUE support from regular height chair with arm rests; still requires elevated surface to perform. 7/20 able to perform STS from regular WC with RW and CGA, extended time needed to lock R knee joint and CGA for initialy steadying. highly reliant on UE    Time 4    Period Weeks    Status On-going    Target Date 12/24/19      PT SHORT TERM GOAL #2   Title Pt to demonstrate 5 degrees hip flexion 10 degrees hip abdct ROM.    Baseline lacks 5 degrees from neutral in both at eval; At visit 10, has 0 degrees flexion/extension; still lacking 5 degrees ABDCT neutral; 08/29/19: R hip: lacking 5 degrees of extension, 15 degrees abduction    Time 4    Period Weeks    Status Achieved    Target Date 06/25/19             PT Long Term Goals - 01/07/20 0935      PT LONG TERM GOAL #1   Title Pt to demonstrates 15 degrees Rt hip ABDCT, 10 degrees Rt hip extension P/ROM to facilitate prosthesis motor control.    Baseline 08/29/19: R hip: lacking 5 degrees of extension, 15 degrees abduction,  10/15/19= R hip lacking 10 deg extension; 7/20 goal deferred to next session  01/07/20+R hip lacking 10 deg extension    Time 8    Period Weeks    Status On-going    Target Date 01/21/20      PT LONG TERM GOAL #2   Title Pt to demonstrate 5/5 strength hip extension at 0 degrees flexion/extension    Baseline has 5/5 in some ranges, but not in ranges specific to standing or gait.; 08/29/19: Unable to get to neutral flexion extension but 5/5 R hip extension in other ranges, 10/15/19= -3/5 right hip  extension; 7/20 deferred to next session8/31/21=-3/5 right hip extension    Time 8    Period Weeks    Status On-going    Target Date 01/21/20      PT LONG TERM GOAL #3   Title Pt able to perfrom 5x STS from chair with RW, arms ad lib in <21sec    Baseline On 6/22 c RW and chair + 1 airex pad: 22.95sec (largely avoids use of RLE for power); 7/20, able to perform from standard WC height, 1:09seconds with BUE use.01/07/20=    Time 12    Period Weeks    Status On-going    Target Date 01/21/20      PT LONG TERM GOAL #4   Title Pt will be able to ambulate at least 100' with RW and supervision with proper locking of R prosthesis in order to demonstrate safe household ambulation in order to improve independence with gait without need for power wheelchair    Baseline Tolerating distance, but speed is inapropriate for distance for energy conservation and safety. Making progress, but needs more improvement.01/07/20= Patient has B hip flex and R knee flex during ambulation 100 feet with RLE buckling x 1 and min assisst with RW    Time 12    Period Weeks    Status On-going    Target Date 01/21/20      PT LONG TERM GOAL #5   Title Patient will continue to maintain a FOTO score of 52 or higher to indicate improvement from initial evaluation in functional activities.    Baseline initial score 48, on 7/20 score 55, 01/07/20=40.02 sec    Time 8    Period Weeks    Status On-going    Target Date 01/21/20                  Plan - 01/14/20 0909    Clinical Impression Statement With fatigue the patient demonstrated decreased ability to lock R knee in reasonable time frame, verbal cues needed, but pt remains highly motivated to continue with therapy. Pt also demonstrated decreased ability to achieve neutral hip positioning in supine due to increased hip flexortension, would benefit from further skilled PT to continue to progress towards goals.    Personal Factors and Comorbidities Age;Fitness;Past/Current Experience;Education;Transportation    Examination-Activity Limitations Transfers;Dressing;Squat;Stairs;Stand;Bed Mobility    Examination-Participation Restrictions Community Activity    Stability/Clinical Decision Making Stable/Uncomplicated    Rehab Potential Good    PT Frequency 2x / week    PT Duration 12 weeks    PT Treatment/Interventions ADLs/Self Care Home Management;Cryotherapy;Electrical Stimulation;DME Instruction;Gait training;Stair training;Functional mobility training;Therapeutic activities;Therapeutic exercise;Balance training;Neuromuscular re-education;Cognitive remediation;Patient/family education;Prosthetic Training;Wheelchair mobility training;Passive range of motion;Dry needling;Scar mobilization;Spinal Manipulations;Joint Manipulations    PT Next Visit Plan Targeted Rt hip extension strengthening in both end range flexion and end range extension ranges, hip rotation strengthening, standing balance adn postural control.    PT Home Exercise Plan Medbridge Access Code: [email or web address] (prone positioning), L single leg bridges, L sidelying R hip abduction, hip isometric adduction    Consulted and Agree with Plan of Care Patient           Patient will benefit from skilled therapeutic intervention in order to improve the following deficits and impairments:  Decreased activity tolerance, Decreased balance, Decreased mobility, Decreased strength, Decreased cognition, Decreased knowledge of use of  DME, Decreased knowledge of precautions, Decreased endurance, Decreased range of motion, Difficulty walking, Decreased safety awareness, Decreased skin integrity, Decreased scar mobility, Hypomobility, Postural dysfunction, Increased  edema, Increased muscle spasms, Increased fascial restricitons, Prosthetic Dependency, Impaired tone, Improper body mechanics  Visit Diagnosis: Difficulty in walking, not elsewhere classified  Unsteadiness on feet     Problem List Patient Active Problem List   Diagnosis Date Noted  . Cardiomyopathy ([place]) 01/22/2019  . CHF (congestive heart failure) ([place]) 01/22/2019  . Coronary disease 01/22/2019  . Above knee amputation of right lower extremity ([place]) 11/13/2018  . Leg ulcer, right, with fat layer exposed ([place]) 10/22/2018  . Cellulitis of right leg 07/21/2018  . Lower limb ulcer, calf, right, limited to breakdown of skin ([place]) 06/08/2018  . PVD (peripheral vascular disease) ([place]) 04/17/2018  . Malnutrition of moderate degree 04/11/2018  . Pressure injury of skin 04/06/2018  . Altered mental status 04/04/2018  . Hypothermia 04/04/2018  . Hemodialysis graft malfunction ([place]) 03/26/2018  . Hypercholesterolemia 02/27/2018  . Diabetes ([place]) 02/27/2018  . Weakness of right lower extremity 01/20/2018  . Fever   . Periumbilical abdominal pain   . Confusion 12/22/2017  . Acute delirium 12/21/2017  . Protein-calorie malnutrition, severe 12/19/2017  . Intractable nausea and vomiting 12/18/2017  . Lymphedema 12/13/2017  . Cellulitis 11/27/2017  . Chest pain 11/19/2017  . Atherosclerosis of native arteries of the extremities with ulceration ([place]) 11/07/2017  . Twitching 01/03/2017  . Elevated troponin 10/02/2015  . Complications, dialysis, catheter, mechanical ([place]) 10/02/2015  . Musculoskeletal chest pain 09/28/2015  . Chronic diastolic CHF (congestive heart failure) ([place]) 09/28/2015  . End stage renal disease ([place]) 10/09/2012  . Hypertension 10/09/2012   . Gout 10/09/2012    Lieutenant Diego PT, DPT 9:53 AM,01/14/20   [place] MAIN [facility] SERVICES [address] [place], [place], [address] Phone: [phone]   Fax:  [phone]  Name: Johnny [name]. MRN: [id number] Date of Birth: 1950-09-12

## 2020-01-16 ENCOUNTER — Ambulatory Visit: Payer: Medicare Other | Admitting: Physical Therapy

## 2020-01-17 ENCOUNTER — Telehealth: Payer: Self-pay

## 2020-01-17 ENCOUNTER — Other Ambulatory Visit
Admission: RE | Admit: 2020-01-17 | Discharge: 2020-01-17 | Disposition: A | Discharge status: Home / Self Care | Payer: Medicare Other | Source: Ambulatory Visit | Attending: Gastroenterology | Admitting: Gastroenterology

## 2020-01-17 ENCOUNTER — Other Ambulatory Visit: Payer: Self-pay

## 2020-01-17 DIAGNOSIS — Z20822 Contact with and (suspected) exposure to covid-19: Secondary | ICD-10-CM | POA: Diagnosis not present

## 2020-01-17 DIAGNOSIS — Z01812 Encounter for preprocedural laboratory examination: Secondary | ICD-10-CM | POA: Insufficient documentation

## 2020-01-17 LAB — SARS CORONAVIRUS 2 (TAT 6-24 HRS): SARS Coronavirus 2: NEGATIVE

## 2020-01-17 NOTE — Telephone Encounter (Signed)
I spoke to [name] (Pts medical POA) regarding blood thinner advice received from NP Eulogio Ditch advising to stop Plavix 5 days before colonoscopy on 01/21/20.  She said Mr. Effinger is not on Plavix.  I told her I would note this message.  Thanks,  [place], [place]

## 2020-01-20 ENCOUNTER — Encounter: Payer: Self-pay | Admitting: Gastroenterology

## 2020-01-21 ENCOUNTER — Ambulatory Visit
Admission: RE | Admit: 2020-01-21 | Discharge: 2020-01-21 | Disposition: A | Discharge status: Home / Self Care | Payer: Medicare Other | Attending: Gastroenterology | Admitting: Gastroenterology

## 2020-01-21 ENCOUNTER — Ambulatory Visit: Payer: Medicare Other

## 2020-01-21 ENCOUNTER — Telehealth: Payer: Self-pay

## 2020-01-21 ENCOUNTER — Ambulatory Visit: Payer: Medicare Other | Admitting: Certified Registered"

## 2020-01-21 ENCOUNTER — Other Ambulatory Visit: Payer: Self-pay

## 2020-01-21 ENCOUNTER — Encounter
Admission: RE | Disposition: A | Discharge status: Home / Self Care | Payer: Self-pay | Source: Home / Self Care | Attending: Gastroenterology

## 2020-01-21 ENCOUNTER — Encounter: Payer: Self-pay | Admitting: Gastroenterology

## 2020-01-21 DIAGNOSIS — Z5309 Procedure and treatment not carried out because of other contraindication: Secondary | ICD-10-CM | POA: Insufficient documentation

## 2020-01-21 DIAGNOSIS — Z1211 Encounter for screening for malignant neoplasm of colon: Secondary | ICD-10-CM | POA: Diagnosis not present

## 2020-01-21 DIAGNOSIS — Z86010 Personal history of colonic polyps: Secondary | ICD-10-CM | POA: Diagnosis present

## 2020-01-21 LAB — CBC
HCT: 36.3 % — ABNORMAL LOW (ref 39.0–52.0)
Hemoglobin: 11.1 g/dL — ABNORMAL LOW (ref 13.0–17.0)
MCH: 30.2 pg (ref 26.0–34.0)
MCHC: 30.6 g/dL (ref 30.0–36.0)
MCV: 98.9 fL (ref 80.0–100.0)
Platelets: 170 10*3/uL (ref 150–400)
RBC: 3.67 MIL/uL — ABNORMAL LOW (ref 4.22–5.81)
RDW: 14.5 % (ref 11.5–15.5)
WBC: 6.6 10*3/uL (ref 4.0–10.5)
nRBC: 0 % (ref 0.0–0.2)

## 2020-01-21 LAB — BASIC METABOLIC PANEL
Anion gap: 16 — ABNORMAL HIGH (ref 5–15)
BUN: 27 mg/dL — ABNORMAL HIGH (ref 8–23)
CO2: 23 mmol/L (ref 22–32)
Calcium: 9.8 mg/dL (ref 8.9–10.3)
Chloride: 106 mmol/L (ref 98–111)
Creatinine, Ser: 9.15 mg/dL — ABNORMAL HIGH (ref 0.61–1.24)
GFR calc Af Amer: 6 mL/min — ABNORMAL LOW (ref >60)
GFR calc non Af Amer: 5 mL/min — ABNORMAL LOW (ref >60)
Glucose, Bld: 62 mg/dL — ABNORMAL LOW (ref 70–99)
Potassium: 5.6 mmol/L — ABNORMAL HIGH (ref 3.5–5.1)
Sodium: 145 mmol/L (ref 135–145)

## 2020-01-21 SURGERY — COLONOSCOPY WITH PROPOFOL
Anesthesia: General

## 2020-01-21 MED ORDER — HYDRALAZINE HCL 25 MG PO TABS: 25.0000 mg | ORAL_TABLET | Freq: Once | ORAL | Status: DC

## 2020-01-21 MED ORDER — METOPROLOL TARTRATE 25 MG PO TABS: 25.0000 mg | ORAL_TABLET | Freq: Once | ORAL | Status: DC

## 2020-01-21 MED ORDER — AMLODIPINE BESYLATE 5 MG PO TABS: 5.0000 mg | ORAL_TABLET | Freq: Every day | ORAL | Status: DC

## 2020-01-21 MED ORDER — SODIUM CHLORIDE 0.9 % IV SOLN: INTRAVENOUS | Status: DC

## 2020-01-21 NOTE — Anesthesia Preprocedure Evaluation (Signed)
Anesthesia Evaluation    Airway        Dental   Pulmonary former smoker,           Cardiovascular hypertension, + CAD, + Past MI, + Peripheral Vascular Disease and +CHF    04/25/17: Left ventricular hypertrophy - mild   Normal left ventricular systolic function, ejection fraction > 55%   Dilated left atrium - moderate   Diastolic dysfunction - grade I (normal filling pressures)   Mitral annular calcification   Mitral regurgitation - mild   Aortic sclerosis   Normal right ventricular systolic function   Dilated right atrium - mild    Neuro/Psych PSYCHIATRIC DISORDERS Anxiety    GI/Hepatic   Endo/Other  diabetes  Renal/GU ESRF and DialysisRenal disease (CKD)     Musculoskeletal   Abdominal   Peds  Hematology  (+) Blood dyscrasia, anemia ,   Anesthesia Other Findings   Reproductive/Obstetrics                             Anesthesia Physical Anesthesia Plan Anesthesia Quick Evaluation

## 2020-01-21 NOTE — Progress Notes (Signed)
Colonoscopy has been canceled for today as patient's blood pressure is in 220s over 120s.  Also, his potassium is 5.6.  Therefore, anesthesia canceled his procedure.  Please follow-up with nephrology to manage hyperkalemia, goal is potassium less than 4 Anesthesia recommends for patient to continue antihypertensive medication on the day of procedure because of his poorly controlled hypertension of medication Resume Plavix today  Please schedule colonoscopy for later date  Cephas Darby, MD [place]  [place], [place] [address]  Main: [phone]  Fax: [phone] Pager: [phone]

## 2020-01-21 NOTE — Telephone Encounter (Signed)
I contacted [name] patients Medical POA.  She said she is so sorry that Johnny [name]'s blood pressure was elevated.  She said she would like to call the office back at a later time to reschedule once his blood pressure is more controlled.  He is on 3 blood pressure medications right now and still working on getting it controled.  Thanks,  [place], [place]

## 2020-01-23 ENCOUNTER — Encounter: Payer: Self-pay | Admitting: Physical Therapy

## 2020-01-23 ENCOUNTER — Ambulatory Visit: Payer: Medicare Other | Admitting: Physical Therapy

## 2020-01-23 ENCOUNTER — Other Ambulatory Visit: Payer: Self-pay

## 2020-01-23 DIAGNOSIS — R2681 Unsteadiness on feet: Secondary | ICD-10-CM

## 2020-01-23 DIAGNOSIS — R262 Difficulty in walking, not elsewhere classified: Secondary | ICD-10-CM | POA: Diagnosis not present

## 2020-01-23 NOTE — Therapy (Signed)
[place] MAIN [facility] SERVICES [address] [place], [place], [address] Phone: [phone]   Fax:  [phone]  Physical Therapy Treatment  Patient Details  Name: Johnny [name]. MRN: [id number] Date of Birth: 1950-11-09 Referring Provider (PT): Eulogio Ditch, NP    Encounter Date: 01/23/2020   PT End of Session - 01/23/20 0941    Visit Number 53    Number of Visits 65    Date for PT Re-Evaluation 01/21/20    Authorization Type [employer] Medicare    Authorization Time Period 10/29/19-01/21/20: FOTO by PT (assessed 7/20 score 55)    PT Start Time 0845    PT Stop Time 0930    PT Time Calculation (min) 45 min    Equipment Utilized During Treatment Gait belt    Activity Tolerance Patient tolerated treatment well;No increased pain    Behavior During Therapy WFL for tasks assessed/performed           Past Medical History:  Diagnosis Date   Anemia    Anxiety    CHF (congestive heart failure) ([place])    Chronic kidney disease    esrd dialysis m/w/f   Gout    Hyperlipidemia    Hypertension    Myocardial infarction ([place]) 2010   10 years ago   Neuromuscular disorder ([place]) 2020   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   Peripheral vascular disease [facility])     Past Surgical History:  Procedure Laterality Date   A/V FISTULAGRAM Right 09/06/2018   Procedure: A/V FISTULAGRAM;  Surgeon: Algernon Huxley, MD;  Location: [place] [facility];  Service: Cardiovascular;  Laterality: Right;   A/V SHUNTOGRAM Left 06/21/2017   Procedure: A/V SHUNTOGRAM;  Surgeon: Katha Cabal, MD;  Location: [place] [facility];  Service: Cardiovascular;  Laterality: Left;   A/V SHUNTOGRAM N/A 10/24/2018   Procedure: A/V SHUNTOGRAM;  Surgeon: Algernon Huxley, MD;  Location: [place] [facility];  Service: Cardiovascular;  Laterality: N/A;   ABOVE KNEE LEG AMPUTATION Right 2020   AMPUTATION Right 10/25/2018   Procedure: AMPUTATION ABOVE KNEE;  Surgeon: Algernon Huxley, MD;  Location: [facility] OR[facility];  Service: General;  Laterality: Right;   APPLICATION OF WOUND VAC Right 04/11/2018   Procedure: APPLICATION OF WOUND VAC;  Surgeon: Algernon Huxley, MD;  Location: [facility] OR[facility];  Service: Vascular;  Laterality: Right;   AV FISTULA PLACEMENT Left 09/18/2015   Procedure: INSERTION OF ARTERIOVENOUS (AV) GORE-TEX GRAFT ARM ( BRACH/AXILLARY GRAFT W/ INSTANT STICK GRAFT );  Surgeon: Katha Cabal, MD;  Location: [facility] OR[facility];  Service: Vascular;  Laterality: Left;   AV FISTULA PLACEMENT Right 07/19/2018   Procedure: INSERTION OF ARTERIOVENOUS (AV) GORE-TEX GRAFT ARM ( BRACHIAL AXILLARY);  Surgeon: Algernon Huxley, MD;  Location: [facility];  Service: Vascular;  Laterality: Right;   DIALYSIS FISTULA CREATION Right 10/2017   right chest perm cath   DIALYSIS/PERMA CATHETER REMOVAL N/A 09/13/2018   Procedure: DIALYSIS/PERMA CATHETER REMOVAL;  Surgeon: Algernon Huxley, MD;  Location: [place] [facility];  Service: Cardiovascular;  Laterality: N/A;   ESOPHAGOGASTRODUODENOSCOPY N/A 12/19/2017   Procedure: ESOPHAGOGASTRODUODENOSCOPY (EGD);  Surgeon: Lin Landsman, MD;  Location: [facility] ENDOSCOPY;  Service: Gastroenterology;  Laterality: N/A;   LOWER EXTREMITY ANGIOGRAPHY Left 11/16/2017   Procedure: LOWER EXTREMITY ANGIOGRAPHY;  Surgeon: Algernon Huxley, MD;  Location: [place] [facility];  Service: Cardiovascular;  Laterality: Left;   LOWER EXTREMITY ANGIOGRAPHY Right 01/18/2018   Procedure: LOWER EXTREMITY ANGIOGRAPHY;  Surgeon:  Algernon Huxley, MD;  Location: [place] [facility];  Service: Cardiovascular;  Laterality: Right;   LOWER EXTREMITY ANGIOGRAPHY Left 04/02/2018   Procedure: LOWER EXTREMITY ANGIOGRAPHY;  Surgeon: Algernon Huxley, MD;  Location: [place] [facility];  Service: Cardiovascular;  Laterality: Left;   LOWER EXTREMITY ANGIOGRAPHY Right 04/09/2018   Procedure: Lower Extremity Angiography with possible intervention;  Surgeon: Algernon Huxley, MD;  Location: [place] [facility];   Service: Cardiovascular;  Laterality: Right;   LOWER EXTREMITY ANGIOGRAPHY Right 07/23/2018   Procedure: Lower Extremity Angiography;  Surgeon: Algernon Huxley, MD;  Location: [place] [facility];  Service: Cardiovascular;  Laterality: Right;   LOWER EXTREMITY ANGIOGRAPHY Right 09/13/2018   Procedure: LOWER EXTREMITY ANGIOGRAPHY;  Surgeon: Algernon Huxley, MD;  Location: [place] [facility];  Service: Cardiovascular;  Laterality: Right;   LOWER EXTREMITY VENOGRAPHY Right 09/13/2018   Procedure: LOWER EXTREMITY VENOGRAPHY;  Surgeon: Algernon Huxley, MD;  Location: [place] [facility];  Service: Cardiovascular;  Laterality: Right;   PERIPHERAL VASCULAR CATHETERIZATION Left 09/01/2015   Procedure: A/V Shuntogram/Fistulagram;  Surgeon: Katha Cabal, MD;  Location: [place] [facility];  Service: Cardiovascular;  Laterality: Left;   PERIPHERAL VASCULAR CATHETERIZATION N/A 09/30/2015   Procedure: A/V Shuntogram/Fistulagram with perm cathether removal;  Surgeon: Algernon Huxley, MD;  Location: [place] [facility];  Service: Cardiovascular;  Laterality: N/A;   PERIPHERAL VASCULAR CATHETERIZATION Left 09/30/2015   Procedure: A/V Shunt Intervention;  Surgeon: Algernon Huxley, MD;  Location: [place] [facility];  Service: Cardiovascular;  Laterality: Left;   PERIPHERAL VASCULAR CATHETERIZATION Left 12/03/2015   Procedure: Thrombectomy;  Surgeon: Algernon Huxley, MD;  Location: [place] [facility];  Service: Cardiovascular;  Laterality: Left;   PERIPHERAL VASCULAR CATHETERIZATION Left 01/28/2016   Procedure: Thrombectomy;  Surgeon: Algernon Huxley, MD;  Location: [place] [facility];  Service: Cardiovascular;  Laterality: Left;   PERIPHERAL VASCULAR CATHETERIZATION N/A 01/28/2016   Procedure: A/V Shuntogram/Fistulagram;  Surgeon: Algernon Huxley, MD;  Location: [place] [facility];  Service: Cardiovascular;  Laterality: N/A;   SKIN SPLIT GRAFT Right 05/24/2018   Procedure: SKIN GRAFT SPLIT THICKNESS ( RIGHT CALF);  Surgeon:  Algernon Huxley, MD;  Location: [facility];  Service: Vascular;  Laterality: Right;   UPPER EXTREMITY ANGIOGRAPHY  10/24/2018   Procedure: Upper Extremity Angiography;  Surgeon: Algernon Huxley, MD;  Location: [place] [facility];  Service: Cardiovascular;;   UPPER EXTREMITY ANGIOGRAPHY Right 02/14/2019   Procedure: UPPER EXTREMITY ANGIOGRAPHY;  Surgeon: Algernon Huxley, MD;  Location: [place] [facility];  Service: Cardiovascular;  Laterality: Right;   WOUND DEBRIDEMENT Right 04/11/2018   Procedure: DEBRIDEMENT WOUND calf muscle and skin;  Surgeon: Algernon Huxley, MD;  Location: [facility] OR[facility];  Service: Vascular;  Laterality: Right;    There were no vitals filed for this visit.   Subjective Assessment - 01/23/20 0937    Subjective Pt has no complaints, doing well this session    Pertinent History Pt underwent Rt AKA amputation in June 2020 after difficulty with wound healing/infection. Pt DC hospital to SNF for rehab. He has since been back at home. Pt uses a manual WC or power chair for daily mobility. Pt takes tranportation services to HD MWF. Pt was recently fitted for his AKA prosthesis in Jan 2021.    Limitations Standing;Walking;House hold activities    How long can you sit comfortably? No difficulty    How long can you stand comfortably?  1 minute    How long can you walk comfortably? unable    Currently in Pain? No/denies    Pain Score 0-No pain    Multiple Pain Sites No             TREATMENT: Squat pivot transfer to nustep, pt very motivated to use nustep feels its helpful, enjoys it x70mins at level 3, >60 SPM Transfers sit to stand from multi level surfaces x 5 reps and min assist and cues for hand placement.  Supine mobility work:  PROM stretching  -SKTC c contralateral hip extension overpressure 3x30sec bilat Hip flexor stretch bilaterall 3x45seconds Leg extension isometrics in near neutral position as able x10 ea LE   Gait training with BRW 100 feet x 1 , 30 feet x 1 with MI and  cues for posture,Cues for heel strike, RW placement. Patient performed with instruction, verbal cues, tactile cues of therapist: goal: increase tissue extensibility, promote proper posture, improve mobility                         PT Education - 01/23/20 0937    Education Details safety with transfers    Person(s) Educated Patient    Methods Explanation    Comprehension Verbalized understanding;Verbal cues required;Tactile cues required            PT Short Term Goals - 11/26/19 0956      PT SHORT TERM GOAL #1   Title Pt to demonstrate ability to perfrom STS transfer from chair c RW minGuard assist and lock his RLE knee joint without assist.    Baseline ModA from elevated surface at eval; 08/29/19: minA+1 with BUE support from regular height chair with arm rests; still requires elevated surface to perform. 7/20 able to perform STS from regular WC with RW and CGA, extended time needed to lock R knee joint and CGA for initialy steadying. highly reliant on UE    Time 4    Period Weeks    Status On-going    Target Date 12/24/19      PT SHORT TERM GOAL #2   Title Pt to demonstrate 5 degrees hip flexion 10 degrees hip abdct ROM.    Baseline lacks 5 degrees from neutral in both at eval; At visit 10, has 0 degrees flexion/extension; still lacking 5 degrees ABDCT neutral; 08/29/19: R hip: lacking 5 degrees of extension, 15 degrees abduction    Time 4    Period Weeks    Status Achieved    Target Date 06/25/19             PT Long Term Goals - 01/07/20 0935      PT LONG TERM GOAL #1   Title Pt to demonstrates 15 degrees Rt hip ABDCT, 10 degrees Rt hip extension P/ROM to facilitate prosthesis motor control.    Baseline 08/29/19: R hip: lacking 5 degrees of extension, 15 degrees abduction, 10/15/19= R hip lacking 10 deg extension; 7/20 goal deferred to next session  01/07/20+R hip lacking 10 deg extension    Time 8    Period Weeks    Status On-going    Target Date  01/21/20      PT LONG TERM GOAL #2   Title Pt to demonstrate 5/5 strength hip extension at 0 degrees flexion/extension    Baseline has 5/5 in some ranges, but not in ranges specific to standing or gait.; 08/29/19: Unable to get to neutral flexion extension but 5/5 R hip  extension in other ranges, 10/15/19= -3/5 right hip extension; 7/20 deferred to next session8/31/21=-3/5 right hip extension    Time 8    Period Weeks    Status On-going    Target Date 01/21/20      PT LONG TERM GOAL #3   Title Pt able to perfrom 5x STS from chair with RW, arms ad lib in <21sec    Baseline On 6/22 c RW and chair + 1 airex pad: 22.95sec (largely avoids use of RLE for power); 7/20, able to perform from standard WC height, 1:09seconds with BUE use.01/07/20=    Time 12    Period Weeks    Status On-going    Target Date 01/21/20      PT LONG TERM GOAL #4   Title Pt will be able to ambulate at least 100' with RW and supervision with proper locking of R prosthesis in order to demonstrate safe household ambulation in order to improve independence with gait without need for power wheelchair    Baseline Tolerating distance, but speed is inapropriate for distance for energy conservation and safety. Making progress, but needs more improvement.01/07/20= Patient has B hip flex and R knee flex during ambulation 100 feet with RLE buckling x 1 and min assisst with RW    Time 12    Period Weeks    Status On-going    Target Date 01/21/20      PT LONG TERM GOAL #5   Title Patient will continue to maintain a FOTO score of 52 or higher to indicate improvement from initial evaluation in functional activities.    Baseline initial score 48, on 7/20 score 55, 01/07/20=40.02 sec    Time 8    Period Weeks    Status On-going    Target Date 01/21/20                 Plan - 01/23/20 0943    Clinical Impression Statement Patient instructed in gait training with RW with negociating turns, small space navigation and different  speeds with assistive device. Patient performs LE strengthening and mobility challenges without pain behaviors. Patient will continue to benefit from skilled PT to improve mobility and safety.     Personal Factors and Comorbidities Age;Fitness;Past/Current Experience;Education;Transportation    Examination-Activity Limitations Transfers;Dressing;Squat;Stairs;Stand;Bed Mobility    Examination-Participation Restrictions Community Activity    Stability/Clinical Decision Making Stable/Uncomplicated    Rehab Potential Good    PT Frequency 2x / week    PT Duration 12 weeks    PT Treatment/Interventions ADLs/Self Care Home Management;Cryotherapy;Electrical Stimulation;DME Instruction;Gait training;Stair training;Functional mobility training;Therapeutic activities;Therapeutic exercise;Balance training;Neuromuscular re-education;Cognitive remediation;Patient/family education;Prosthetic Training;Wheelchair mobility training;Passive range of motion;Dry needling;Scar mobilization;Spinal Manipulations;Joint Manipulations    PT Next Visit Plan Targeted Rt hip extension strengthening in both end range flexion and end range extension ranges, hip rotation strengthening, standing balance adn postural control.    PT Home Exercise Plan Medbridge Access Code: [email or web address] (prone positioning), L single leg bridges, L sidelying R hip abduction, hip isometric adduction    Consulted and Agree with Plan of Care Patient           Patient will benefit from skilled therapeutic intervention in order to improve the following deficits and impairments:  Decreased activity tolerance, Decreased balance, Decreased mobility, Decreased strength, Decreased cognition, Decreased knowledge of use of DME, Decreased knowledge of precautions, Decreased endurance, Decreased range of motion, Difficulty walking, Decreased safety awareness, Decreased skin integrity, Decreased scar mobility, Hypomobility, Postural dysfunction, Increased edema,  Increased muscle spasms, Increased  fascial restricitons, Prosthetic Dependency, Impaired tone, Improper body mechanics  Visit Diagnosis: Difficulty in walking, not elsewhere classified  Unsteadiness on feet     Problem List Patient Active Problem List   Diagnosis Date Noted   Cardiomyopathy ([place]) 01/22/2019   CHF (congestive heart failure) ([place]) 01/22/2019   Coronary disease 01/22/2019   Above knee amputation of right lower extremity ([place]) 11/13/2018   Leg ulcer, right, with fat layer exposed ([place]) 10/22/2018   Cellulitis of right leg 07/21/2018   Lower limb ulcer, calf, right, limited to breakdown of skin ([place]) 06/08/2018   PVD (peripheral vascular disease) ([place]) 04/17/2018   Malnutrition of moderate degree 04/11/2018   Pressure injury of skin 04/06/2018   Altered mental status 04/04/2018   Hypothermia 04/04/2018   Hemodialysis graft malfunction ([place]) 03/26/2018   Hypercholesterolemia 02/27/2018   Diabetes ([place]) 02/27/2018   Weakness of right lower extremity 01/20/2018   Fever    Periumbilical abdominal pain    Confusion 12/22/2017   Acute delirium 12/21/2017   Protein-calorie malnutrition, severe 12/19/2017   Intractable nausea and vomiting 12/18/2017   Lymphedema 12/13/2017   Cellulitis 11/27/2017   Chest pain 11/19/2017   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) 10/09/2012   Hypertension 10/09/2012   Gout 10/09/2012    Alanson Puls, PT DPT 01/23/2020, 9:53 AM  [place] MAIN [facility] SERVICES [address] [place], [place], [address] Phone: [phone]   Fax:  [phone]  Name:  Georg Ang. MRN: [id number] Date of Birth: [birth year]

## 2020-01-27 NOTE — Addendum Note (Signed)
Addended by: Alanson Puls on: 01/27/2020 03:51 PM   Modules accepted: Orders

## 2020-01-28 ENCOUNTER — Other Ambulatory Visit: Payer: Self-pay

## 2020-01-28 ENCOUNTER — Ambulatory Visit: Payer: Medicare Other

## 2020-01-28 DIAGNOSIS — R262 Difficulty in walking, not elsewhere classified: Secondary | ICD-10-CM | POA: Diagnosis not present

## 2020-01-28 DIAGNOSIS — R2681 Unsteadiness on feet: Secondary | ICD-10-CM

## 2020-01-28 NOTE — Therapy (Signed)
[place] MAIN [facility] SERVICES [address] [place], [place], [address] Phone: [phone]   Fax:  [phone]  Physical Therapy Treatment  Patient Details  Name: Johnny [name]. MRN: [id number] Date of Birth: 02-Mar-1951 Referring Provider (PT): Eulogio Ditch, NP    Encounter Date: 01/28/2020   PT End of Session - 01/28/20 0854    Visit Number 54    Number of Visits 65    Date for PT Re-Evaluation 03/19/20    Authorization Type [employer] Medicare    Authorization Time Period 10/29/19-01/21/20: FOTO by PT (assessed 7/20 score 55)    PT Start Time 0847    PT Stop Time 0930    PT Time Calculation (min) 43 min    Equipment Utilized During Treatment Gait belt    Activity Tolerance Patient tolerated treatment well;No increased pain    Behavior During Therapy WFL for tasks assessed/performed           Past Medical History:  Diagnosis Date  . Anemia   . Anxiety   . CHF (congestive heart failure) ([place])   . Chronic kidney disease    esrd dialysis m/w/f  . Gout   . Hyperlipidemia   . Hypertension   . Myocardial infarction ([place]) 2010   10 years ago  . 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  . Peripheral vascular disease [facility])     Past Surgical History:  Procedure Laterality Date  . A/V FISTULAGRAM Right 09/06/2018   Procedure: A/V FISTULAGRAM;  Surgeon: Algernon Huxley, MD;  Location: [place] [facility];  Service: Cardiovascular;  Laterality: Right;  . A/V SHUNTOGRAM Left 06/21/2017   Procedure: A/V SHUNTOGRAM;  Surgeon: Katha Cabal, MD;  Location: [place] [facility];  Service: Cardiovascular;  Laterality: Left;  . A/V SHUNTOGRAM N/A 10/24/2018   Procedure: A/V SHUNTOGRAM;  Surgeon: Algernon Huxley, MD;  Location: [place] [facility];  Service: Cardiovascular;  Laterality: N/A;  . ABOVE KNEE LEG AMPUTATION Right 2020  . AMPUTATION Right 10/25/2018   Procedure: AMPUTATION ABOVE KNEE;  Surgeon: Algernon Huxley, MD;  Location: [facility] ORS;  Service: General;  Laterality: Right;  . APPLICATION OF WOUND VAC Right 04/11/2018   Procedure: APPLICATION OF WOUND VAC;  Surgeon: Algernon Huxley, MD;  Location: [facility] ORS;  Service: Vascular;  Laterality: Right;  . AV FISTULA PLACEMENT Left 09/18/2015   Procedure: INSERTION OF ARTERIOVENOUS (AV) GORE-TEX GRAFT ARM ( BRACH/AXILLARY GRAFT W/ INSTANT STICK GRAFT );  Surgeon: Katha Cabal, MD;  Location: [facility] ORS;  Service: Vascular;  Laterality: Left;  . AV FISTULA PLACEMENT Right 07/19/2018   Procedure: INSERTION OF ARTERIOVENOUS (AV) GORE-TEX GRAFT ARM ( BRACHIAL AXILLARY);  Surgeon: Algernon Huxley, MD;  Location: [facility] ORS;  Service: Vascular;  Laterality: Right;  . DIALYSIS FISTULA CREATION Right 10/2017   right chest perm cath  . DIALYSIS/PERMA CATHETER REMOVAL N/A 09/13/2018   Procedure: DIALYSIS/PERMA CATHETER REMOVAL;  Surgeon: Algernon Huxley, MD;  Location: [place] [facility];  Service: Cardiovascular;  Laterality: N/A;  . ESOPHAGOGASTRODUODENOSCOPY N/A 12/19/2017   Procedure: ESOPHAGOGASTRODUODENOSCOPY (EGD);  Surgeon: Lin Landsman, MD;  Location: [facility] ENDOSCOPY;  Service: Gastroenterology;  Laterality: N/A;  . LOWER EXTREMITY ANGIOGRAPHY Left 11/16/2017   Procedure: LOWER EXTREMITY ANGIOGRAPHY;  Surgeon: Algernon Huxley, MD;  Location: [place] [facility];  Service: Cardiovascular;  Laterality: Left;  . LOWER EXTREMITY ANGIOGRAPHY Right 01/18/2018   Procedure: LOWER EXTREMITY ANGIOGRAPHY;  Surgeon:  Algernon Huxley, MD;  Location: [place] [facility];  Service: Cardiovascular;  Laterality: Right;  . LOWER EXTREMITY ANGIOGRAPHY Left 04/02/2018   Procedure: LOWER EXTREMITY ANGIOGRAPHY;  Surgeon: Algernon Huxley, MD;  Location: [place] [facility];  Service: Cardiovascular;  Laterality: Left;  . LOWER EXTREMITY ANGIOGRAPHY Right 04/09/2018   Procedure: Lower Extremity Angiography with possible intervention;  Surgeon: Algernon Huxley, MD;  Location: [place] [facility];   Service: Cardiovascular;  Laterality: Right;  . LOWER EXTREMITY ANGIOGRAPHY Right 07/23/2018   Procedure: Lower Extremity Angiography;  Surgeon: Algernon Huxley, MD;  Location: [place] [facility];  Service: Cardiovascular;  Laterality: Right;  . LOWER EXTREMITY ANGIOGRAPHY Right 09/13/2018   Procedure: LOWER EXTREMITY ANGIOGRAPHY;  Surgeon: Algernon Huxley, MD;  Location: [place] [facility];  Service: Cardiovascular;  Laterality: Right;  . LOWER EXTREMITY VENOGRAPHY Right 09/13/2018   Procedure: LOWER EXTREMITY VENOGRAPHY;  Surgeon: Algernon Huxley, MD;  Location: [place] [facility];  Service: Cardiovascular;  Laterality: Right;  . PERIPHERAL VASCULAR CATHETERIZATION Left 09/01/2015   Procedure: A/V Shuntogram/Fistulagram;  Surgeon: Katha Cabal, MD;  Location: [place] [facility];  Service: Cardiovascular;  Laterality: Left;  . PERIPHERAL VASCULAR CATHETERIZATION N/A 09/30/2015   Procedure: A/V Shuntogram/Fistulagram with perm cathether removal;  Surgeon: Algernon Huxley, MD;  Location: [place] [facility];  Service: Cardiovascular;  Laterality: N/A;  . PERIPHERAL VASCULAR CATHETERIZATION Left 09/30/2015   Procedure: A/V Shunt Intervention;  Surgeon: Algernon Huxley, MD;  Location: [place] [facility];  Service: Cardiovascular;  Laterality: Left;  . PERIPHERAL VASCULAR CATHETERIZATION Left 12/03/2015   Procedure: Thrombectomy;  Surgeon: Algernon Huxley, MD;  Location: [place] [facility];  Service: Cardiovascular;  Laterality: Left;  . PERIPHERAL VASCULAR CATHETERIZATION Left 01/28/2016   Procedure: Thrombectomy;  Surgeon: Algernon Huxley, MD;  Location: [place] [facility];  Service: Cardiovascular;  Laterality: Left;  . PERIPHERAL VASCULAR CATHETERIZATION N/A 01/28/2016   Procedure: A/V Shuntogram/Fistulagram;  Surgeon: Algernon Huxley, MD;  Location: [place] [facility];  Service: Cardiovascular;  Laterality: N/A;  . SKIN SPLIT GRAFT Right 05/24/2018   Procedure: SKIN GRAFT SPLIT THICKNESS ( RIGHT CALF);  Surgeon:  Algernon Huxley, MD;  Location: [facility];  Service: Vascular;  Laterality: Right;  . UPPER EXTREMITY ANGIOGRAPHY  10/24/2018   Procedure: Upper Extremity Angiography;  Surgeon: Algernon Huxley, MD;  Location: [place] [facility];  Service: Cardiovascular;;  . UPPER EXTREMITY ANGIOGRAPHY Right 02/14/2019   Procedure: UPPER EXTREMITY ANGIOGRAPHY;  Surgeon: Algernon Huxley, MD;  Location: [place] [facility];  Service: Cardiovascular;  Laterality: Right;  . WOUND DEBRIDEMENT Right 04/11/2018   Procedure: DEBRIDEMENT WOUND calf muscle and skin;  Surgeon: Algernon Huxley, MD;  Location: [facility] OR[facility];  Service: Vascular;  Laterality: Right;    There were no vitals filed for this visit.   Subjective Assessment - 01/28/20 0853    Subjective Pt has no complaints, doing well this session.  Denies any fall since last therapy session.  Patient reports that the prosthetist is currently fashioning a new socket.  No specific questions or concerns.    Pertinent History Pt underwent Rt AKA amputation in June 2020 after difficulty with wound healing/infection. Pt DC hospital to SNF for rehab. He has since been back at home. Pt uses a manual WC or power chair for daily mobility. Pt takes tranportation services to HD MWF. Pt was recently fitted for his AKA prosthesis in Jan 2021.  Limitations Standing;Walking;House hold activities    How long can you sit comfortably? No difficulty    How long can you stand comfortably? 1 minute    How long can you walk comfortably? unable    Currently in Pain? No/denies                 TREATMENT   Gait Training Gait training with RW 166feetand 150 feetwith CGA. Pt provided cues for upright posture and minimal cues for sequencing with walker as well as foot placement. He does not require any external assist for balance. Consistent locking/unlocking of prosthetic without cues from therapist.   TherapeuticExercise Seated exercises: Hip marches x 20 BLE; Clams with manual  resistance from therapist x 20 BLE; Adductor squeeze with manual resistance from therapist x 20 BLE;  Standing in // bars: Balancing with no UE support WBOS x 30 secs; Weight shifts with L heel raise x 10, patient cued for LUE only; Standing marches with BUE support x 10 BLE, cues to bring RLE back before placing following lift; Sit to stands with LUE support x 5, patient cued to lock out knee when standing straight and then unlock it to sit down.   Pt educated throughout session about proper posture and technique with exercises. Improved exercise technique, movement at target joints, use of target muscles after min to mod verbal, visual, tactile cues.    Patient demonstrated excellent motivation during session today. Continued to progress with ambulation distance today. His gait sequencing is improving and pt is more steady. He consistently locks and unlocks knee without cues. He does still require some cues for foot placement as well as posture. Incorporated standing balance and strengthening exercises in the // bars to help with weight shifts and stepping with the L foot. Pt encouraged to follow-up as scheduled and continue home program. Patient will continue to benefit from continued skilled therapy in order to improve dynamic standing balance and increase strength in order to decrease fall.                         PT Short Term Goals - 11/26/19 0956      PT SHORT TERM GOAL #1   Title Pt to demonstrate ability to perfrom STS transfer from chair c RW minGuard assist and lock his RLE knee joint without assist.    Baseline ModA from elevated surface at eval; 08/29/19: minA+1 with BUE support from regular height chair with arm rests; still requires elevated surface to perform. 7/20 able to perform STS from regular WC with RW and CGA, extended time needed to lock R knee joint and CGA for initialy steadying. highly reliant on UE    Time 4    Period Weeks    Status  On-going    Target Date 12/24/19      PT SHORT TERM GOAL #2   Title Pt to demonstrate 5 degrees hip flexion 10 degrees hip abdct ROM.    Baseline lacks 5 degrees from neutral in both at eval; At visit 10, has 0 degrees flexion/extension; still lacking 5 degrees ABDCT neutral; 08/29/19: R hip: lacking 5 degrees of extension, 15 degrees abduction    Time 4    Period Weeks    Status Achieved    Target Date 06/25/19             PT Long Term Goals - 01/07/20 0935      PT LONG TERM GOAL #1   Title  Pt to demonstrates 15 degrees Rt hip ABDCT, 10 degrees Rt hip extension P/ROM to facilitate prosthesis motor control.    Baseline 08/29/19: R hip: lacking 5 degrees of extension, 15 degrees abduction, 10/15/19= R hip lacking 10 deg extension; 7/20 goal deferred to next session  01/07/20+R hip lacking 10 deg extension    Time 8    Period Weeks    Status On-going    Target Date 01/21/20      PT LONG TERM GOAL #2   Title Pt to demonstrate 5/5 strength hip extension at 0 degrees flexion/extension    Baseline has 5/5 in some ranges, but not in ranges specific to standing or gait.; 08/29/19: Unable to get to neutral flexion extension but 5/5 R hip extension in other ranges, 10/15/19= -3/5 right hip extension; 7/20 deferred to next session8/31/21=-3/5 right hip extension    Time 8    Period Weeks    Status On-going    Target Date 01/21/20      PT LONG TERM GOAL #3   Title Pt able to perfrom 5x STS from chair with RW, arms ad lib in <21sec    Baseline On 6/22 c RW and chair + 1 airex pad: 22.95sec (largely avoids use of RLE for power); 7/20, able to perform from standard WC height, 1:09seconds with BUE use.01/07/20=    Time 12    Period Weeks    Status On-going    Target Date 01/21/20      PT LONG TERM GOAL #4   Title Pt will be able to ambulate at least 100' with RW and supervision with proper locking of R prosthesis in order to demonstrate safe household ambulation in order to improve independence  with gait without need for power wheelchair    Baseline Tolerating distance, but speed is inapropriate for distance for energy conservation and safety. Making progress, but needs more improvement.01/07/20= Patient has B hip flex and R knee flex during ambulation 100 feet with RLE buckling x 1 and min assisst with RW    Time 12    Period Weeks    Status On-going    Target Date 01/21/20      PT LONG TERM GOAL #5   Title Patient will continue to maintain a FOTO score of 52 or higher to indicate improvement from initial evaluation in functional activities.    Baseline initial score 48, on 7/20 score 55, 01/07/20=40.02 sec    Time 8    Period Weeks    Status On-going    Target Date 01/21/20                 Plan - 01/28/20 0854    Clinical Impression Statement Patient demonstrated excellent motivation during session today. Continued to progress with ambulation distance today. His gait sequencing is improving and pt is more steady. He consistently locks and unlocks knee without cues. He does still require some cues for foot placement as well as posture. Incorporated standing balance and strengthening exercises in the // bars to help with weight shifts and stepping with the L foot. Pt encouraged to follow-up as scheduled and continue home program. Patient will continue to benefit from continued skilled therapy in order to improve dynamic standing balance and increase strength in order to decrease fall.    Personal Factors and Comorbidities Age;Fitness;Past/Current Experience;Education;Transportation    Examination-Activity Limitations Transfers;Dressing;Squat;Stairs;Stand;Bed Mobility    Examination-Participation Restrictions Community Activity    Stability/Clinical Decision Making Stable/Uncomplicated    Rehab Potential Good  PT Frequency 2x / week    PT Duration 12 weeks    PT Treatment/Interventions ADLs/Self Care Home Management;Cryotherapy;Electrical Stimulation;DME Instruction;Gait  training;Stair training;Functional mobility training;Therapeutic activities;Therapeutic exercise;Balance training;Neuromuscular re-education;Cognitive remediation;Patient/family education;Prosthetic Training;Wheelchair mobility training;Passive range of motion;Dry needling;Scar mobilization;Spinal Manipulations;Joint Manipulations    PT Next Visit Plan Targeted Rt hip extension strengthening in both end range flexion and end range extension ranges, hip rotation strengthening, standing balance adn postural control.    PT Home Exercise Plan Medbridge Access Code: [email or web address] (prone positioning), L single leg bridges, L sidelying R hip abduction, hip isometric adduction    Consulted and Agree with Plan of Care Patient           Patient will benefit from skilled therapeutic intervention in order to improve the following deficits and impairments:  Decreased activity tolerance, Decreased balance, Decreased mobility, Decreased strength, Decreased cognition, Decreased knowledge of use of DME, Decreased knowledge of precautions, Decreased endurance, Decreased range of motion, Difficulty walking, Decreased safety awareness, Decreased skin integrity, Decreased scar mobility, Hypomobility, Postural dysfunction, Increased edema, Increased muscle spasms, Increased fascial restricitons, Prosthetic Dependency, Impaired tone, Improper body mechanics  Visit Diagnosis: Difficulty in walking, not elsewhere classified  Unsteadiness on feet     Problem List Patient Active Problem List   Diagnosis Date Noted  . Cardiomyopathy ([place]) 01/22/2019  . CHF (congestive heart failure) ([place]) 01/22/2019  . Coronary disease 01/22/2019  . Above knee amputation of right lower extremity ([place]) 11/13/2018  . Leg ulcer, right, with fat layer exposed ([place]) 10/22/2018  . Cellulitis of right leg 07/21/2018  . Lower limb ulcer, calf, right, limited to breakdown of skin ([place]) 06/08/2018  . PVD (peripheral vascular disease) ([place])  04/17/2018  . Malnutrition of moderate degree 04/11/2018  . Pressure injury of skin 04/06/2018  . Altered mental status 04/04/2018  . Hypothermia 04/04/2018  . Hemodialysis graft malfunction ([place]) 03/26/2018  . Hypercholesterolemia 02/27/2018  . Diabetes ([place]) 02/27/2018  . Weakness of right lower extremity 01/20/2018  . Fever   . Periumbilical abdominal pain   . Confusion 12/22/2017  . Acute delirium 12/21/2017  . Protein-calorie malnutrition, severe 12/19/2017  . Intractable nausea and vomiting 12/18/2017  . Lymphedema 12/13/2017  . Cellulitis 11/27/2017  . Chest pain 11/19/2017  . Atherosclerosis of native arteries of the extremities with ulceration ([place]) 11/07/2017  . Twitching 01/03/2017  . Elevated troponin 10/02/2015  . Complications, dialysis, catheter, mechanical ([place]) 10/02/2015  . Musculoskeletal chest pain 09/28/2015  . Chronic diastolic CHF (congestive heart failure) ([place]) 09/28/2015  . End stage renal disease ([place]) 10/09/2012  . Hypertension 10/09/2012  . Gout 10/09/2012   Phillips Grout PT, DPT, GCS  Denora Wysocki 01/28/2020, 9:40 AM  [place] MAIN [facility] SERVICES [address] [place], [place], [address] Phone: [phone]   Fax:  [phone]  Name: Johnny [name]. MRN: [id number] Date of Birth: December 03, 1950

## 2020-01-30 ENCOUNTER — Encounter (INDEPENDENT_AMBULATORY_CARE_PROVIDER_SITE_OTHER): Payer: Medicare Other

## 2020-01-30 ENCOUNTER — Ambulatory Visit (INDEPENDENT_AMBULATORY_CARE_PROVIDER_SITE_OTHER): Payer: Medicare Other | Admitting: Nurse Practitioner

## 2020-02-04 ENCOUNTER — Other Ambulatory Visit (INDEPENDENT_AMBULATORY_CARE_PROVIDER_SITE_OTHER): Payer: Self-pay | Admitting: Nurse Practitioner

## 2020-02-04 ENCOUNTER — Other Ambulatory Visit: Payer: Self-pay

## 2020-02-04 ENCOUNTER — Ambulatory Visit: Payer: Medicare Other | Admitting: Physical Therapy

## 2020-02-04 ENCOUNTER — Encounter: Payer: Self-pay | Admitting: Physical Therapy

## 2020-02-04 DIAGNOSIS — N186 End stage renal disease: Secondary | ICD-10-CM

## 2020-02-04 DIAGNOSIS — Z992 Dependence on renal dialysis: Secondary | ICD-10-CM

## 2020-02-04 DIAGNOSIS — R262 Difficulty in walking, not elsewhere classified: Secondary | ICD-10-CM | POA: Diagnosis not present

## 2020-02-04 DIAGNOSIS — R2681 Unsteadiness on feet: Secondary | ICD-10-CM

## 2020-02-04 DIAGNOSIS — I739 Peripheral vascular disease, unspecified: Secondary | ICD-10-CM

## 2020-02-04 NOTE — Therapy (Signed)
[employer] MAIN [facility] SERVICES [address] [place], [place], [address] Phone: [phone]   Fax:  [phone]  Physical Therapy Treatment  Patient Details  Name: Johnny [name]. MRN: [id number] Date of Birth: 12-31-1950 Referring Provider (PT): Eulogio Ditch, NP    Encounter Date: 02/04/2020   PT End of Session - 02/04/20 0933    Visit Number 55    Number of Visits 65    Date for PT Re-Evaluation 03/19/20    Authorization Type [employer] Medicare    Authorization Time Period 10/29/19-01/21/20: FOTO by PT (assessed 7/20 score 55)    PT Start Time 0856    PT Stop Time 0934    PT Time Calculation (min) 38 min    Equipment Utilized During Treatment Gait belt    Activity Tolerance Patient tolerated treatment well;No increased pain    Behavior During Therapy WFL for tasks assessed/performed           Past Medical History:  Diagnosis Date  . Anemia   . Anxiety   . CHF (congestive heart failure) ([place])   . Chronic kidney disease    esrd dialysis m/w/f  . Gout   . Hyperlipidemia   . Hypertension   . Myocardial infarction ([place]) 2010   10 years ago  . 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  . Peripheral vascular disease [facility])     Past Surgical History:  Procedure Laterality Date  . A/V FISTULAGRAM Right 09/06/2018   Procedure: A/V FISTULAGRAM;  Surgeon: Algernon Huxley, MD;  Location: [place] [facility];  Service: Cardiovascular;  Laterality: Right;  . A/V SHUNTOGRAM Left 06/21/2017   Procedure: A/V SHUNTOGRAM;  Surgeon: Katha Cabal, MD;  Location: [place] [facility];  Service: Cardiovascular;  Laterality: Left;  . A/V SHUNTOGRAM N/A 10/24/2018   Procedure: A/V SHUNTOGRAM;  Surgeon: Algernon Huxley, MD;  Location: [place] [facility];  Service: Cardiovascular;  Laterality: N/A;  . ABOVE KNEE LEG AMPUTATION Right 2020  . AMPUTATION Right 10/25/2018   Procedure: AMPUTATION ABOVE KNEE;  Surgeon: Algernon Huxley, MD;  Location: [facility] ORS;  Service: General;  Laterality: Right;  . APPLICATION OF WOUND VAC Right 04/11/2018   Procedure: APPLICATION OF WOUND VAC;  Surgeon: Algernon Huxley, MD;  Location: [facility];  Service: Vascular;  Laterality: Right;  . AV FISTULA PLACEMENT Left 09/18/2015   Procedure: INSERTION OF ARTERIOVENOUS (AV) GORE-TEX GRAFT ARM ( BRACH/AXILLARY GRAFT W/ INSTANT STICK GRAFT );  Surgeon: Katha Cabal, MD;  Location: [facility] OR[facility];  Service: Vascular;  Laterality: Left;  . AV FISTULA PLACEMENT Right 07/19/2018   Procedure: INSERTION OF ARTERIOVENOUS (AV) GORE-TEX GRAFT ARM ( BRACHIAL AXILLARY);  Surgeon: Algernon Huxley, MD;  Location: [facility] OR[facility];  Service: Vascular;  Laterality: Right;  . DIALYSIS FISTULA CREATION Right 10/2017   right chest perm cath  . DIALYSIS/PERMA CATHETER REMOVAL N/A 09/13/2018   Procedure: DIALYSIS/PERMA CATHETER REMOVAL;  Surgeon: Algernon Huxley, MD;  Location: [place] [facility];  Service: Cardiovascular;  Laterality: N/A;  . ESOPHAGOGASTRODUODENOSCOPY N/A 12/19/2017   Procedure: ESOPHAGOGASTRODUODENOSCOPY (EGD);  Surgeon: Lin Landsman, MD;  Location: [facility];  Service: Gastroenterology;  Laterality: N/A;  . LOWER EXTREMITY ANGIOGRAPHY Left 11/16/2017   Procedure: LOWER EXTREMITY ANGIOGRAPHY;  Surgeon: Algernon Huxley, MD;  Location: [place] [facility];  Service: Cardiovascular;  Laterality: Left;  . LOWER EXTREMITY ANGIOGRAPHY Right 01/18/2018   Procedure: LOWER EXTREMITY ANGIOGRAPHY;  Surgeon:  Algernon Huxley, MD;  Location: [place] [facility];  Service: Cardiovascular;  Laterality: Right;  . LOWER EXTREMITY ANGIOGRAPHY Left 04/02/2018   Procedure: LOWER EXTREMITY ANGIOGRAPHY;  Surgeon: Algernon Huxley, MD;  Location: [place] [facility];  Service: Cardiovascular;  Laterality: Left;  . LOWER EXTREMITY ANGIOGRAPHY Right 04/09/2018   Procedure: Lower Extremity Angiography with possible intervention;  Surgeon: Algernon Huxley, MD;  Location: [place] [facility];   Service: Cardiovascular;  Laterality: Right;  . LOWER EXTREMITY ANGIOGRAPHY Right 07/23/2018   Procedure: Lower Extremity Angiography;  Surgeon: Algernon Huxley, MD;  Location: [place] [facility];  Service: Cardiovascular;  Laterality: Right;  . LOWER EXTREMITY ANGIOGRAPHY Right 09/13/2018   Procedure: LOWER EXTREMITY ANGIOGRAPHY;  Surgeon: Algernon Huxley, MD;  Location: [place] [facility];  Service: Cardiovascular;  Laterality: Right;  . LOWER EXTREMITY VENOGRAPHY Right 09/13/2018   Procedure: LOWER EXTREMITY VENOGRAPHY;  Surgeon: Algernon Huxley, MD;  Location: [place] [facility];  Service: Cardiovascular;  Laterality: Right;  . PERIPHERAL VASCULAR CATHETERIZATION Left 09/01/2015   Procedure: A/V Shuntogram/Fistulagram;  Surgeon: Katha Cabal, MD;  Location: [place] [facility];  Service: Cardiovascular;  Laterality: Left;  . PERIPHERAL VASCULAR CATHETERIZATION N/A 09/30/2015   Procedure: A/V Shuntogram/Fistulagram with perm cathether removal;  Surgeon: Algernon Huxley, MD;  Location: [place] [facility];  Service: Cardiovascular;  Laterality: N/A;  . PERIPHERAL VASCULAR CATHETERIZATION Left 09/30/2015   Procedure: A/V Shunt Intervention;  Surgeon: Algernon Huxley, MD;  Location: [place] [facility];  Service: Cardiovascular;  Laterality: Left;  . PERIPHERAL VASCULAR CATHETERIZATION Left 12/03/2015   Procedure: Thrombectomy;  Surgeon: Algernon Huxley, MD;  Location: [place] [facility];  Service: Cardiovascular;  Laterality: Left;  . PERIPHERAL VASCULAR CATHETERIZATION Left 01/28/2016   Procedure: Thrombectomy;  Surgeon: Algernon Huxley, MD;  Location: [place] [facility];  Service: Cardiovascular;  Laterality: Left;  . PERIPHERAL VASCULAR CATHETERIZATION N/A 01/28/2016   Procedure: A/V Shuntogram/Fistulagram;  Surgeon: Algernon Huxley, MD;  Location: [place] [facility];  Service: Cardiovascular;  Laterality: N/A;  . SKIN SPLIT GRAFT Right 05/24/2018   Procedure: SKIN GRAFT SPLIT THICKNESS ( RIGHT CALF);  Surgeon:  Algernon Huxley, MD;  Location: [facility];  Service: Vascular;  Laterality: Right;  . UPPER EXTREMITY ANGIOGRAPHY  10/24/2018   Procedure: Upper Extremity Angiography;  Surgeon: Algernon Huxley, MD;  Location: [place] [facility];  Service: Cardiovascular;;  . UPPER EXTREMITY ANGIOGRAPHY Right 02/14/2019   Procedure: UPPER EXTREMITY ANGIOGRAPHY;  Surgeon: Algernon Huxley, MD;  Location: [place] [facility];  Service: Cardiovascular;  Laterality: Right;  . WOUND DEBRIDEMENT Right 04/11/2018   Procedure: DEBRIDEMENT WOUND calf muscle and skin;  Surgeon: Algernon Huxley, MD;  Location: [facility];  Service: Vascular;  Laterality: Right;    There were no vitals filed for this visit.   Subjective Assessment - 02/04/20 0933    Subjective Pt has no complaints, doing well this session.  Denies any fall since last therapy session.  Patient reports that the prosthetist is currently fashioning a new socket.  No specific questions or concerns.    Pertinent History Pt underwent Rt AKA amputation in June 2020 after difficulty with wound healing/infection. Pt DC hospital to SNF for rehab. He has since been back at home. Pt uses a manual WC or power chair for daily mobility. Pt takes tranportation services to HD MWF. Pt was recently fitted for his AKA prosthesis in Jan 2021.  Limitations Standing;Walking;House hold activities    How long can you sit comfortably? No difficulty    How long can you stand comfortably? 1 minute    How long can you walk comfortably? unable    Currently in Pain? No/denies    Pain Score 0-No pain           TREATMENT: Squat pivot transfer to nustep, pt very motivated to use nustep feels its helpful, enjoys it x5mins at level 3, >60 SPM Transfers sit to stand from multi level surfaces x 5 reps and min assist and cues for hand placement. Gait training with BRW 100 feet x 2 , 30 feet x 1 with MI and cues for posture,Cues for heel strike, RW placement. Patient performed with instruction, verbal  cues, tactile cues of therapist: goal:increase tissue extensibility, promote proper posture, improve mobility    Patient performed with instruction, verbal cues, tactile cues of therapist: goal: increase tissue extensibility, promote proper posture, improve mobility                       PT Education - 02/04/20 0933    Education Details safety with mobility    Person(s) Educated Patient    Methods Explanation    Comprehension Verbalized understanding            PT Short Term Goals - 11/26/19 0956      PT SHORT TERM GOAL #1   Title Pt to demonstrate ability to perfrom STS transfer from chair c RW minGuard assist and lock his RLE knee joint without assist.    Baseline ModA from elevated surface at eval; 08/29/19: minA+1 with BUE support from regular height chair with arm rests; still requires elevated surface to perform. 7/20 able to perform STS from regular WC with RW and CGA, extended time needed to lock R knee joint and CGA for initialy steadying. highly reliant on UE    Time 4    Period Weeks    Status On-going    Target Date 12/24/19      PT SHORT TERM GOAL #2   Title Pt to demonstrate 5 degrees hip flexion 10 degrees hip abdct ROM.    Baseline lacks 5 degrees from neutral in both at eval; At visit 10, has 0 degrees flexion/extension; still lacking 5 degrees ABDCT neutral; 08/29/19: R hip: lacking 5 degrees of extension, 15 degrees abduction    Time 4    Period Weeks    Status Achieved    Target Date 06/25/19             PT Long Term Goals - 01/07/20 0935      PT LONG TERM GOAL #1   Title Pt to demonstrates 15 degrees Rt hip ABDCT, 10 degrees Rt hip extension P/ROM to facilitate prosthesis motor control.    Baseline 08/29/19: R hip: lacking 5 degrees of extension, 15 degrees abduction, 10/15/19= R hip lacking 10 deg extension; 7/20 goal deferred to next session  01/07/20+R hip lacking 10 deg extension    Time 8    Period Weeks    Status On-going     Target Date 01/21/20      PT LONG TERM GOAL #2   Title Pt to demonstrate 5/5 strength hip extension at 0 degrees flexion/extension    Baseline has 5/5 in some ranges, but not in ranges specific to standing or gait.; 08/29/19: Unable to get to neutral flexion extension but 5/5 R hip extension in other ranges, 10/15/19= -  3/5 right hip extension; 7/20 deferred to next session8/31/21=-3/5 right hip extension    Time 8    Period Weeks    Status On-going    Target Date 01/21/20      PT LONG TERM GOAL #3   Title Pt able to perfrom 5x STS from chair with RW, arms ad lib in <21sec    Baseline On 6/22 c RW and chair + 1 airex pad: 22.95sec (largely avoids use of RLE for power); 7/20, able to perform from standard WC height, 1:09seconds with BUE use.01/07/20=    Time 12    Period Weeks    Status On-going    Target Date 01/21/20      PT LONG TERM GOAL #4   Title Pt will be able to ambulate at least 100' with RW and supervision with proper locking of R prosthesis in order to demonstrate safe household ambulation in order to improve independence with gait without need for power wheelchair    Baseline Tolerating distance, but speed is inapropriate for distance for energy conservation and safety. Making progress, but needs more improvement.01/07/20= Patient has B hip flex and R knee flex during ambulation 100 feet with RLE buckling x 1 and min assisst with RW    Time 12    Period Weeks    Status On-going    Target Date 01/21/20      PT LONG TERM GOAL #5   Title Patient will continue to maintain a FOTO score of 52 or higher to indicate improvement from initial evaluation in functional activities.    Baseline initial score 48, on 7/20 score 55, 01/07/20=40.02 sec    Time 8    Period Weeks    Status On-going    Target Date 01/21/20                 Plan - 02/04/20 0935    Clinical Impression Statement Patient  instructed in transfers and gait training  with rest periods sitting. Patient has better  sequencing but continues to have RLE  IR during gait.  Patient will continue to benefit from skilled PT to improve strength, balance and gait. Patient continues to have LE weakness and decreased static and dynamic standing balance.   Personal Factors and Comorbidities Age;Fitness;Past/Current Experience;Education;Transportation    Examination-Activity Limitations Transfers;Dressing;Squat;Stairs;Stand;Bed Mobility    Examination-Participation Restrictions Community Activity    Stability/Clinical Decision Making Stable/Uncomplicated    Rehab Potential Good    PT Frequency 2x / week    PT Duration 12 weeks    PT Treatment/Interventions ADLs/Self Care Home Management;Cryotherapy;Electrical Stimulation;DME Instruction;Gait training;Stair training;Functional mobility training;Therapeutic activities;Therapeutic exercise;Balance training;Neuromuscular re-education;Cognitive remediation;Patient/family education;Prosthetic Training;Wheelchair mobility training;Passive range of motion;Dry needling;Scar mobilization;Spinal Manipulations;Joint Manipulations    PT Next Visit Plan Targeted Rt hip extension strengthening in both end range flexion and end range extension ranges, hip rotation strengthening, standing balance adn postural control.    PT Home Exercise Plan Medbridge Access Code: [email or web address] (prone positioning), L single leg bridges, L sidelying R hip abduction, hip isometric adduction    Consulted and Agree with Plan of Care Patient           Patient will benefit from skilled therapeutic intervention in order to improve the following deficits and impairments:  Decreased activity tolerance, Decreased balance, Decreased mobility, Decreased strength, Decreased cognition, Decreased knowledge of use of DME, Decreased knowledge of precautions, Decreased endurance, Decreased range of motion, Difficulty walking, Decreased safety awareness, Decreased skin integrity, Decreased scar mobility, Hypomobility, Postural  dysfunction, Increased  edema, Increased muscle spasms, Increased fascial restricitons, Prosthetic Dependency, Impaired tone, Improper body mechanics  Visit Diagnosis: Difficulty in walking, not elsewhere classified  Unsteadiness on feet     Problem List Patient Active Problem List   Diagnosis Date Noted  . Cardiomyopathy ([place]) 01/22/2019  . CHF (congestive heart failure) ([place]) 01/22/2019  . Coronary disease 01/22/2019  . Above knee amputation of right lower extremity ([place]) 11/13/2018  . Leg ulcer, right, with fat layer exposed ([place]) 10/22/2018  . Cellulitis of right leg 07/21/2018  . Lower limb ulcer, calf, right, limited to breakdown of skin ([place]) 06/08/2018  . PVD (peripheral vascular disease) ([place]) 04/17/2018  . Malnutrition of moderate degree 04/11/2018  . Pressure injury of skin 04/06/2018  . Altered mental status 04/04/2018  . Hypothermia 04/04/2018  . Hemodialysis graft malfunction ([place]) 03/26/2018  . Hypercholesterolemia 02/27/2018  . Diabetes ([place]) 02/27/2018  . Weakness of right lower extremity 01/20/2018  . Fever   . Periumbilical abdominal pain   . Confusion 12/22/2017  . Acute delirium 12/21/2017  . Protein-calorie malnutrition, severe 12/19/2017  . Intractable nausea and vomiting 12/18/2017  . Lymphedema 12/13/2017  . Cellulitis 11/27/2017  . Chest pain 11/19/2017  . Atherosclerosis of native arteries of the extremities with ulceration ([employer]) 11/07/2017  . Twitching 01/03/2017  . Elevated troponin 10/02/2015  . Complications, dialysis, catheter, mechanical ([place]) 10/02/2015  . Musculoskeletal chest pain 09/28/2015  . Chronic diastolic CHF (congestive heart failure) ([place]) 09/28/2015  . End stage renal disease ([place]) 10/09/2012  . Hypertension 10/09/2012  . Gout 10/09/2012    Alanson Puls, PT DPT 02/04/2020, 9:36 AM  [place] MAIN [facility] SERVICES [address] [place], [place], [address] Phone: [phone]    Fax:  [phone]  Name: Johnny [name]. MRN: [id number] Date of Birth: [birth year]

## 2020-02-06 ENCOUNTER — Ambulatory Visit (INDEPENDENT_AMBULATORY_CARE_PROVIDER_SITE_OTHER): Payer: Medicare Other

## 2020-02-06 ENCOUNTER — Other Ambulatory Visit: Payer: Self-pay

## 2020-02-06 ENCOUNTER — Ambulatory Visit (INDEPENDENT_AMBULATORY_CARE_PROVIDER_SITE_OTHER): Payer: Medicare Other | Admitting: Nurse Practitioner

## 2020-02-06 ENCOUNTER — Encounter (INDEPENDENT_AMBULATORY_CARE_PROVIDER_SITE_OTHER): Payer: Self-pay | Admitting: Nurse Practitioner

## 2020-02-06 ENCOUNTER — Ambulatory Visit: Payer: Medicare Other | Admitting: Physical Therapy

## 2020-02-06 VITALS — BP 160/80 | HR 82 | Resp 16

## 2020-02-06 DIAGNOSIS — I739 Peripheral vascular disease, unspecified: Secondary | ICD-10-CM | POA: Diagnosis not present

## 2020-02-06 DIAGNOSIS — Z992 Dependence on renal dialysis: Secondary | ICD-10-CM

## 2020-02-06 DIAGNOSIS — N186 End stage renal disease: Secondary | ICD-10-CM

## 2020-02-06 DIAGNOSIS — I10 Essential (primary) hypertension: Secondary | ICD-10-CM

## 2020-02-06 DIAGNOSIS — I7025 Atherosclerosis of native arteries of other extremities with ulceration: Secondary | ICD-10-CM | POA: Diagnosis not present

## 2020-02-06 DIAGNOSIS — B351 Tinea unguium: Secondary | ICD-10-CM | POA: Diagnosis not present

## 2020-02-06 NOTE — Progress Notes (Signed)
Subjective:    Patient ID: Johnny Bucco., male    DOB: Jul 13, 1950, 69 y.o.   MRN: [id number] Chief Complaint  Patient presents with  . Follow-up    ultrasound follow up    The patient returns today for follow-up and review of his atherosclerotic disease in addition to his end-stage renal disease.  The patient notes that he denies any lower wounds or ulcerations of his lower extremities.  He denies any rest pain or claudication-like symptoms.  The patient has been working with physical therapy to work with his prosthesis.  The patient notes that his dialysis sessions have been going well however his hand swells from time to time.  It is not always associated with dialysis it seems to be sporadic at times.  He does note however that his hand gets very cold during dialysis however it is tolerable.  The patient does have a previous history of central venous stenosis in addition to steal syndrome.  He notes that the swelling is not consistent and it generally resolves without intervention.  He also notes that when he is not on dialysis his hand is not cold and he does not have any discolorations or ulcerations.  He denies any fever, chills, nausea, vomiting or diarrhea.  He denies chest pain or shortness of breath.  Today noninvasive studies show a noncompressible waveforms in the left lower extremity with a TBI 0.54.  The patient has biphasic tibial artery waveforms with good toe waveforms.  This is consistent with previous study done on 07/25/2019.  The patient's HDA shows a flow volume of 1032 this is decreased from previous flow volume of 1788.  The patient does not have any area of significant stenosis within the graft however the venous outflow pressures are elevated from previous studies.   Review of Systems  Musculoskeletal: Positive for gait problem.       Hand swelling   All other systems reviewed and are negative.      Objective:   Physical Exam Vitals reviewed.  HENT:     Head:  Normocephalic.  Cardiovascular:     Rate and Rhythm: Normal rate.     Pulses:          Radial pulses are 2+ on the right side and 2+ on the left side.     Arteriovenous access: right arteriovenous access is present.    Comments: Good thrill and bruit Pulmonary:     Effort: Pulmonary effort is normal.  Musculoskeletal:     Right Lower Extremity: Right leg is amputated below knee.  Neurological:     Mental Status: He is alert and oriented to person, place, and time.     Gait: Gait abnormal.  Psychiatric:        Mood and Affect: Mood normal.        Behavior: Behavior normal.        Thought Content: Thought content normal.        Judgment: Judgment normal.     BP (!) 160/80 (BP Location: Left Arm)   Pulse 82   Resp 16   Past Medical History:  Diagnosis Date  . Anemia   . Anxiety   . CHF (congestive heart failure) ([place])   . Chronic kidney disease    esrd dialysis m/w/f  . Gout   . Hyperlipidemia   . Hypertension   . Myocardial infarction ([place]) 2010   10 years ago  . 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  . Peripheral vascular disease ([place])     Social History   Socioeconomic History  . Marital status: Single    Spouse name: Not on file  . Number of children: 0  . Years of education: Not on file  . Highest education level: Not on file  Occupational History  . Occupation: retired    Comment: Medical laboratory scientific officer  Tobacco Use  . Smoking status: Former Smoker    Types: Cigarettes    Quit date: 05/17/2005    Years since quitting: 14.7  . Smokeless tobacco: Never Used  Vaping Use  . Vaping Use: Never used  Substance and Sexual Activity  . Alcohol use: No  . Drug use: No  . Sexual activity: Not Currently  Other Topics Concern  . Not on file  Social History Narrative  . Not on file   Social Determinants of Health   Financial Resource Strain:   . Difficulty of Paying Living Expenses: Not on file  Food Insecurity:   .  Worried About Charity fundraiser in the Last Year: Not on file  . Ran Out of Food in the Last Year: Not on file  Transportation Needs:   . Lack of Transportation (Medical): Not on file  . Lack of Transportation (Non-Medical): Not on file  Physical Activity:   . Days of Exercise per Week: Not on file  . Minutes of Exercise per Session: Not on file  Stress:   . Feeling of Stress : Not on file  Social Connections:   . Frequency of Communication with Friends and Family: Not on file  . Frequency of Social Gatherings with Friends and Family: Not on file  . Attends Religious Services: Not on file  . Active Member of Clubs or Organizations: Not on file  . Attends Archivist Meetings: Not on file  . Marital Status: Not on file  Intimate Partner Violence:   . Fear of Current or Ex-Partner: Not on file  . Emotionally Abused: Not on file  . Physically Abused: Not on file  . Sexually Abused: Not on file    Past Surgical History:  Procedure Laterality Date  . A/V FISTULAGRAM Right 09/06/2018   Procedure: A/V FISTULAGRAM;  Surgeon: Algernon Huxley, MD;  Location: [facility];  Service: Cardiovascular;  Laterality: Right;  . A/V SHUNTOGRAM Left 06/21/2017   Procedure: A/V SHUNTOGRAM;  Surgeon: Katha Cabal, MD;  Location: [facility];  Service: Cardiovascular;  Laterality: Left;  . A/V SHUNTOGRAM N/A 10/24/2018   Procedure: A/V SHUNTOGRAM;  Surgeon: Algernon Huxley, MD;  Location: [facility];  Service: Cardiovascular;  Laterality: N/A;  . ABOVE KNEE LEG AMPUTATION Right 2020  . AMPUTATION Right 10/25/2018   Procedure: AMPUTATION ABOVE KNEE;  Surgeon: Algernon Huxley, MD;  Location: [facility];  Service: General;  Laterality: Right;  . APPLICATION OF WOUND VAC Right 04/11/2018   Procedure: APPLICATION OF WOUND VAC;  Surgeon: Algernon Huxley, MD;  Location: [facility];  Service: Vascular;  Laterality: Right;  . AV FISTULA PLACEMENT Left 09/18/2015   Procedure: INSERTION OF  ARTERIOVENOUS (AV) GORE-TEX GRAFT ARM ( BRACH/AXILLARY GRAFT W/ INSTANT STICK GRAFT );  Surgeon: Katha Cabal, MD;  Location: [facility];  Service: Vascular;  Laterality: Left;  . AV FISTULA PLACEMENT Right 07/19/2018   Procedure: INSERTION OF ARTERIOVENOUS (AV) GORE-TEX GRAFT ARM ( BRACHIAL AXILLARY);  Surgeon: Algernon Huxley, MD;  Location: [facility];  Service: Vascular;  Laterality: Right;  . DIALYSIS FISTULA CREATION Right 10/2017   right chest perm cath  . DIALYSIS/PERMA CATHETER REMOVAL N/A 09/13/2018   Procedure: DIALYSIS/PERMA CATHETER REMOVAL;  Surgeon: Algernon Huxley, MD;  Location: [facility];  Service: Cardiovascular;  Laterality: N/A;  . ESOPHAGOGASTRODUODENOSCOPY N/A 12/19/2017   Procedure: ESOPHAGOGASTRODUODENOSCOPY (EGD);  Surgeon: Lin Landsman, MD;  Location: [facility];  Service: Gastroenterology;  Laterality: N/A;  . LOWER EXTREMITY ANGIOGRAPHY Left 11/16/2017   Procedure: LOWER EXTREMITY ANGIOGRAPHY;  Surgeon: Algernon Huxley, MD;  Location: [facility];  Service: Cardiovascular;  Laterality: Left;  . LOWER EXTREMITY ANGIOGRAPHY Right 01/18/2018   Procedure: LOWER EXTREMITY ANGIOGRAPHY;  Surgeon: Algernon Huxley, MD;  Location: [facility];  Service: Cardiovascular;  Laterality: Right;  . LOWER EXTREMITY ANGIOGRAPHY Left 04/02/2018   Procedure: LOWER EXTREMITY ANGIOGRAPHY;  Surgeon: Algernon Huxley, MD;  Location: [facility];  Service: Cardiovascular;  Laterality: Left;  . LOWER EXTREMITY ANGIOGRAPHY Right 04/09/2018   Procedure: Lower Extremity Angiography with possible intervention;  Surgeon: Algernon Huxley, MD;  Location: [facility];  Service: Cardiovascular;  Laterality: Right;  . LOWER EXTREMITY ANGIOGRAPHY Right 07/23/2018   Procedure: Lower Extremity Angiography;  Surgeon: Algernon Huxley, MD;  Location: [facility];  Service: Cardiovascular;  Laterality: Right;  . LOWER EXTREMITY ANGIOGRAPHY Right 09/13/2018   Procedure: LOWER  EXTREMITY ANGIOGRAPHY;  Surgeon: Algernon Huxley, MD;  Location: [facility];  Service: Cardiovascular;  Laterality: Right;  . LOWER EXTREMITY VENOGRAPHY Right 09/13/2018   Procedure: LOWER EXTREMITY VENOGRAPHY;  Surgeon: Algernon Huxley, MD;  Location: [facility];  Service: Cardiovascular;  Laterality: Right;  . PERIPHERAL VASCULAR CATHETERIZATION Left 09/01/2015   Procedure: A/V Shuntogram/Fistulagram;  Surgeon: Katha Cabal, MD;  Location: [facility];  Service: Cardiovascular;  Laterality: Left;  . PERIPHERAL VASCULAR CATHETERIZATION N/A 09/30/2015   Procedure: A/V Shuntogram/Fistulagram with perm cathether removal;  Surgeon: Algernon Huxley, MD;  Location: [facility];  Service: Cardiovascular;  Laterality: N/A;  . PERIPHERAL VASCULAR CATHETERIZATION Left 09/30/2015   Procedure: A/V Shunt Intervention;  Surgeon: Algernon Huxley, MD;  Location: [facility];  Service: Cardiovascular;  Laterality: Left;  . PERIPHERAL VASCULAR CATHETERIZATION Left 12/03/2015   Procedure: Thrombectomy;  Surgeon: Algernon Huxley, MD;  Location: [facility];  Service: Cardiovascular;  Laterality: Left;  . PERIPHERAL VASCULAR CATHETERIZATION Left 01/28/2016   Procedure: Thrombectomy;  Surgeon: Algernon Huxley, MD;  Location: [facility];  Service: Cardiovascular;  Laterality: Left;  . PERIPHERAL VASCULAR CATHETERIZATION N/A 01/28/2016   Procedure: A/V Shuntogram/Fistulagram;  Surgeon: Algernon Huxley, MD;  Location: [facility];  Service: Cardiovascular;  Laterality: N/A;  . SKIN SPLIT GRAFT Right 05/24/2018   Procedure: SKIN GRAFT SPLIT THICKNESS ( RIGHT CALF);  Surgeon: Algernon Huxley, MD;  Location: [facility];  Service: Vascular;  Laterality: Right;  . UPPER EXTREMITY ANGIOGRAPHY  10/24/2018   Procedure: Upper Extremity Angiography;  Surgeon: Algernon Huxley, MD;  Location: [facility];  Service: Cardiovascular;;  . UPPER EXTREMITY ANGIOGRAPHY Right 02/14/2019    Procedure: UPPER EXTREMITY ANGIOGRAPHY;  Surgeon: Algernon Huxley, MD;  Location: [facility];  Service: Cardiovascular;  Laterality: Right;  . WOUND DEBRIDEMENT Right 04/11/2018   Procedure: DEBRIDEMENT WOUND calf muscle and skin;  Surgeon: Algernon Huxley, MD;  Location: [facility];  Service: Vascular;  Laterality: Right;  Family History  Problem Relation Age of Onset  . Hypertension Other   . Heart disease Other   . Diabetes Mother     Allergies  Allergen Reactions  . Shellfish Allergy Anaphylaxis       Assessment & Plan:   1. Atherosclerosis of native arteries of the extremities with ulceration ([place])  Recommend:  The patient has evidence of atherosclerosis of the lower extremities with claudication.  The patient does not voice lifestyle limiting changes at this point in time.  Noninvasive studies do not suggest clinically significant change.  No invasive studies, angiography or surgery at this time The patient should continue walking and begin a more formal exercise program.  The patient should continue antiplatelet therapy and aggressive treatment of the lipid abnormalities  No changes in the patient's medications at this time  The patient should continue wearing graduated compression socks 10-15 mmHg strength to control the mild edema.    2. End stage renal disease ([place]) While the patient did not have an area of hemodynamically significant stenosis in relation to the graft it is noticeable that the venous outflow pressures have increased significantly from the previous studies.  The patient's flow volume is also decreased.  The patient symptoms are somewhat concerning also for some steal syndrome.  I discussed intervention with the patient however at this time he notes that his symptoms are not severe and does not wish to proceed with intervention.  In which case we'll have close follow-up with the patient and return in 3 months with noninvasive studies.  Otherwise the  patient is instructed to contact her office if he begins to have signs of severe steal syndrome or persistent swelling of his upper extremity.  3. Hypertension, unspecified type Continue antihypertensive medications as already ordered, these medications have been reviewed and there are no changes at this time.   4. Onychomycosis The patient does have some thickened toenails on his left lower extremity.  It has been approximately 2 years since he is seeing podiatry.  We'll refer the patient to podiatry for proper foot care. - Ambulatory referral to Podiatry    Current Outpatient Medications on File Prior to Visit  Medication Sig Dispense Refill  . allopurinol (ZYLOPRIM) 100 MG tablet Take 100 mg by mouth daily.    [name] amLODipine (NORVASC) 5 MG tablet Take 1 tablet (5 mg total) by mouth daily. 30 tablet 0  . atorvastatin (LIPITOR) 10 MG tablet Take 1 tablet by mouth daily.    . clonazePAM (KLONOPIN) 0.5 MG tablet Take 1 tablet (0.5 mg total) by mouth as needed. 10 tablet 0  . clopidogrel (PLAVIX) 75 MG tablet Take 1 tablet (75 mg total) by mouth daily. 30 tablet 11  . folic acid-vitamin b complex-vitamin c-selenium-zinc (DIALYVITE) 3 MG TABS tablet Take 1 tablet by mouth daily.     [name] gabapentin (NEURONTIN) 100 MG capsule Take 1 capsule (100 mg total) by mouth at bedtime. (Patient taking differently: Take 100 mg by mouth 2 (two) times daily. )    . hydrALAZINE (APRESOLINE) 25 MG tablet Take 25 mg by mouth 2 (two) times daily.    [name] HYDROcodone-acetaminophen (NORCO) 5-325 MG tablet Take 1 tablet by mouth every 6 (six) hours as needed for moderate pain. 20 tablet 0  . losartan (COZAAR) 100 MG tablet Take 100 mg by mouth daily.    . metoprolol tartrate (LOPRESSOR) 25 MG tablet Take 25 mg by mouth every evening.    . Multiple Vitamin (MULTIVITAMIN) LIQD Take  5 mLs by mouth daily.    . sevelamer carbonate (RENVELA) 800 MG tablet Take 2,400 mg by mouth 3 (three) times daily with meals.     [name] aspirin  EC 81 MG tablet Take 1 tablet (81 mg total) by mouth daily. (Patient not taking: Reported on 01/21/2020) 150 tablet 2   No current facility-administered medications on file prior to visit.    There are no Patient Instructions on file for this visit. No follow-ups on file.   Kris Hartmann, NP

## 2020-02-11 ENCOUNTER — Ambulatory Visit: Payer: Medicare Other | Attending: Nurse Practitioner | Admitting: Physical Therapy

## 2020-02-11 ENCOUNTER — Other Ambulatory Visit: Payer: Self-pay

## 2020-02-11 ENCOUNTER — Encounter: Payer: Self-pay | Admitting: Physical Therapy

## 2020-02-11 DIAGNOSIS — R262 Difficulty in walking, not elsewhere classified: Secondary | ICD-10-CM | POA: Diagnosis present

## 2020-02-11 DIAGNOSIS — R2681 Unsteadiness on feet: Secondary | ICD-10-CM | POA: Diagnosis present

## 2020-02-11 NOTE — Therapy (Signed)
[place] MAIN [facility] SERVICES [address] [place], [place], [address] Phone: [phone]   Fax:  [phone]  Physical Therapy Treatment  Patient Details  Name: Johnny [name]. MRN: [id number] Date of Birth: 1950-12-04 Referring Provider (PT): Eulogio Ditch, NP    Encounter Date: 02/11/2020   PT End of Session - 02/11/20 0935    Visit Number 56    Number of Visits 65    Date for PT Re-Evaluation 03/19/20    Authorization Type [employer] Medicare    Authorization Time Period 10/29/19-01/21/20: FOTO by PT (assessed 7/20 score 55)    PT Start Time 0840    PT Stop Time 0940    PT Time Calculation (min) 60 min    Equipment Utilized During Treatment Gait belt    Activity Tolerance Patient tolerated treatment well;No increased pain    Behavior During Therapy WFL for tasks assessed/performed           Past Medical History:  Diagnosis Date  . Anemia   . Anxiety   . CHF (congestive heart failure) ([place])   . Chronic kidney disease    esrd dialysis m/w/f  . Gout   . Hyperlipidemia   . Hypertension   . Myocardial infarction ([place]) 2010   10 years ago  . 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  . Peripheral vascular disease [facility])     Past Surgical History:  Procedure Laterality Date  . A/V FISTULAGRAM Right 09/06/2018   Procedure: A/V FISTULAGRAM;  Surgeon: Algernon Huxley, MD;  Location: [place] [facility];  Service: Cardiovascular;  Laterality: Right;  . A/V SHUNTOGRAM Left 06/21/2017   Procedure: A/V SHUNTOGRAM;  Surgeon: Katha Cabal, MD;  Location: [place] [facility];  Service: Cardiovascular;  Laterality: Left;  . A/V SHUNTOGRAM N/A 10/24/2018   Procedure: A/V SHUNTOGRAM;  Surgeon: Algernon Huxley, MD;  Location: [place] [facility];  Service: Cardiovascular;  Laterality: N/A;  . ABOVE KNEE LEG AMPUTATION Right 2020  . AMPUTATION Right 10/25/2018   Procedure: AMPUTATION ABOVE KNEE;  Surgeon: Algernon Huxley, MD;  Location: [facility];  Service: General;  Laterality: Right;  . APPLICATION OF WOUND VAC Right 04/11/2018   Procedure: APPLICATION OF WOUND VAC;  Surgeon: Algernon Huxley, MD;  Location: [facility];  Service: Vascular;  Laterality: Right;  . AV FISTULA PLACEMENT Left 09/18/2015   Procedure: INSERTION OF ARTERIOVENOUS (AV) GORE-TEX GRAFT ARM ( BRACH/AXILLARY GRAFT W/ INSTANT STICK GRAFT );  Surgeon: Katha Cabal, MD;  Location: [facility];  Service: Vascular;  Laterality: Left;  . AV FISTULA PLACEMENT Right 07/19/2018   Procedure: INSERTION OF ARTERIOVENOUS (AV) GORE-TEX GRAFT ARM ( BRACHIAL AXILLARY);  Surgeon: Algernon Huxley, MD;  Location: [facility];  Service: Vascular;  Laterality: Right;  . DIALYSIS FISTULA CREATION Right 10/2017   right chest perm cath  . DIALYSIS/PERMA CATHETER REMOVAL N/A 09/13/2018   Procedure: DIALYSIS/PERMA CATHETER REMOVAL;  Surgeon: Algernon Huxley, MD;  Location: [place] [facility];  Service: Cardiovascular;  Laterality: N/A;  . ESOPHAGOGASTRODUODENOSCOPY N/A 12/19/2017   Procedure: ESOPHAGOGASTRODUODENOSCOPY (EGD);  Surgeon: Lin Landsman, MD;  Location: [facility]COPY;  Service: Gastroenterology;  Laterality: N/A;  . LOWER EXTREMITY ANGIOGRAPHY Left 11/16/2017   Procedure: LOWER EXTREMITY ANGIOGRAPHY;  Surgeon: Algernon Huxley, MD;  Location: [place] [facility];  Service: Cardiovascular;  Laterality: Left;  . LOWER EXTREMITY ANGIOGRAPHY Right 01/18/2018   Procedure: LOWER EXTREMITY ANGIOGRAPHY;  Surgeon:  Algernon Huxley, MD;  Location: [place] [facility];  Service: Cardiovascular;  Laterality: Right;  . LOWER EXTREMITY ANGIOGRAPHY Left 04/02/2018   Procedure: LOWER EXTREMITY ANGIOGRAPHY;  Surgeon: Algernon Huxley, MD;  Location: [place] [facility];  Service: Cardiovascular;  Laterality: Left;  . LOWER EXTREMITY ANGIOGRAPHY Right 04/09/2018   Procedure: Lower Extremity Angiography with possible intervention;  Surgeon: Algernon Huxley, MD;  Location: [place] [facility];   Service: Cardiovascular;  Laterality: Right;  . LOWER EXTREMITY ANGIOGRAPHY Right 07/23/2018   Procedure: Lower Extremity Angiography;  Surgeon: Algernon Huxley, MD;  Location: [place] [facility];  Service: Cardiovascular;  Laterality: Right;  . LOWER EXTREMITY ANGIOGRAPHY Right 09/13/2018   Procedure: LOWER EXTREMITY ANGIOGRAPHY;  Surgeon: Algernon Huxley, MD;  Location: [place] [facility];  Service: Cardiovascular;  Laterality: Right;  . LOWER EXTREMITY VENOGRAPHY Right 09/13/2018   Procedure: LOWER EXTREMITY VENOGRAPHY;  Surgeon: Algernon Huxley, MD;  Location: [place] [facility];  Service: Cardiovascular;  Laterality: Right;  . PERIPHERAL VASCULAR CATHETERIZATION Left 09/01/2015   Procedure: A/V Shuntogram/Fistulagram;  Surgeon: Katha Cabal, MD;  Location: [place] [facility];  Service: Cardiovascular;  Laterality: Left;  . PERIPHERAL VASCULAR CATHETERIZATION N/A 09/30/2015   Procedure: A/V Shuntogram/Fistulagram with perm cathether removal;  Surgeon: Algernon Huxley, MD;  Location: [place] [facility];  Service: Cardiovascular;  Laterality: N/A;  . PERIPHERAL VASCULAR CATHETERIZATION Left 09/30/2015   Procedure: A/V Shunt Intervention;  Surgeon: Algernon Huxley, MD;  Location: [place] [facility];  Service: Cardiovascular;  Laterality: Left;  . PERIPHERAL VASCULAR CATHETERIZATION Left 12/03/2015   Procedure: Thrombectomy;  Surgeon: Algernon Huxley, MD;  Location: [place] [facility];  Service: Cardiovascular;  Laterality: Left;  . PERIPHERAL VASCULAR CATHETERIZATION Left 01/28/2016   Procedure: Thrombectomy;  Surgeon: Algernon Huxley, MD;  Location: [place] [facility];  Service: Cardiovascular;  Laterality: Left;  . PERIPHERAL VASCULAR CATHETERIZATION N/A 01/28/2016   Procedure: A/V Shuntogram/Fistulagram;  Surgeon: Algernon Huxley, MD;  Location: [place] [facility];  Service: Cardiovascular;  Laterality: N/A;  . SKIN SPLIT GRAFT Right 05/24/2018   Procedure: SKIN GRAFT SPLIT THICKNESS ( RIGHT CALF);  Surgeon:  Algernon Huxley, MD;  Location: [facility];  Service: Vascular;  Laterality: Right;  . UPPER EXTREMITY ANGIOGRAPHY  10/24/2018   Procedure: Upper Extremity Angiography;  Surgeon: Algernon Huxley, MD;  Location: [place] [facility];  Service: Cardiovascular;;  . UPPER EXTREMITY ANGIOGRAPHY Right 02/14/2019   Procedure: UPPER EXTREMITY ANGIOGRAPHY;  Surgeon: Algernon Huxley, MD;  Location: [place] [facility];  Service: Cardiovascular;  Laterality: Right;  . WOUND DEBRIDEMENT Right 04/11/2018   Procedure: DEBRIDEMENT WOUND calf muscle and skin;  Surgeon: Algernon Huxley, MD;  Location: [facility];  Service: Vascular;  Laterality: Right;    There were no vitals filed for this visit.   Subjective Assessment - 02/11/20 0934    Subjective Pt has no complaints, doing well this session.  Denies any fall since last therapy session.  Patient reports that the prosthetist is currently fashioning a new socket.  No specific questions or concerns.    Pertinent History Pt underwent Rt AKA amputation in June 2020 after difficulty with wound healing/infection. Pt DC hospital to SNF for rehab. He has since been back at home. Pt uses a manual WC or power chair for daily mobility. Pt takes tranportation services to HD MWF. Pt was recently fitted for his AKA prosthesis in Jan 2021.  Limitations Standing;Walking;House hold activities    How long can you sit comfortably? No difficulty    How long can you stand comfortably? 1 minute    How long can you walk comfortably? unable    Currently in Pain? No/denies    Pain Score 0-No pain    Multiple Pain Sites No             Treatment:  Gait training with RW 100 feet x2with cGA and RLE having difficulty with IR Transfer training sit to stand from power wc <> nu-step withCGA Transfer training from power wc <> stand with RW x 5 , cues for sequencing and safety 5 x sit to stand with cues for sequencing and safety  Therapeutic exercise: Nu-step x15 mins L 4  Supine: SLR x 15  LLE Hookling marching x 15  Hooklying ABD/ER L hip abd/ add x 15 Sidelying: Hip abd x 15 , LLE  Patient performed with instruction, verbal cues, tactile cues of therapist: goal:increase tissue extensibility, promote proper posture, improve mobility                         PT Education - 02/11/20 0934    Education Details HEP and safety with transfers    Person(s) Educated Patient    Methods Explanation    Comprehension Verbalized understanding            PT Short Term Goals - 11/26/19 0956      PT SHORT TERM GOAL #1   Title Pt to demonstrate ability to perfrom STS transfer from chair c RW minGuard assist and lock his RLE knee joint without assist.    Baseline ModA from elevated surface at eval; 08/29/19: minA+1 with BUE support from regular height chair with arm rests; still requires elevated surface to perform. 7/20 able to perform STS from regular WC with RW and CGA, extended time needed to lock R knee joint and CGA for initialy steadying. highly reliant on UE    Time 4    Period Weeks    Status On-going    Target Date 12/24/19      PT SHORT TERM GOAL #2   Title Pt to demonstrate 5 degrees hip flexion 10 degrees hip abdct ROM.    Baseline lacks 5 degrees from neutral in both at eval; At visit 10, has 0 degrees flexion/extension; still lacking 5 degrees ABDCT neutral; 08/29/19: R hip: lacking 5 degrees of extension, 15 degrees abduction    Time 4    Period Weeks    Status Achieved    Target Date 06/25/19             PT Long Term Goals - 01/07/20 0935      PT LONG TERM GOAL #1   Title Pt to demonstrates 15 degrees Rt hip ABDCT, 10 degrees Rt hip extension P/ROM to facilitate prosthesis motor control.    Baseline 08/29/19: R hip: lacking 5 degrees of extension, 15 degrees abduction, 10/15/19= R hip lacking 10 deg extension; 7/20 goal deferred to next session  01/07/20+R hip lacking 10 deg extension    Time 8    Period Weeks    Status On-going     Target Date 01/21/20      PT LONG TERM GOAL #2   Title Pt to demonstrate 5/5 strength hip extension at 0 degrees flexion/extension    Baseline has 5/5 in some ranges, but not in ranges specific to standing or gait.; 08/29/19: Karen Kays  to get to neutral flexion extension but 5/5 R hip extension in other ranges, 10/15/19= -3/5 right hip extension; 7/20 deferred to next session8/31/21=-3/5 right hip extension    Time 8    Period Weeks    Status On-going    Target Date 01/21/20      PT LONG TERM GOAL #3   Title Pt able to perfrom 5x STS from chair with RW, arms ad lib in <21sec    Baseline On 6/22 c RW and chair + 1 airex pad: 22.95sec (largely avoids use of RLE for power); 7/20, able to perform from standard WC height, 1:09seconds with BUE use.01/07/20=    Time 12    Period Weeks    Status On-going    Target Date 01/21/20      PT LONG TERM GOAL #4   Title Pt will be able to ambulate at least 100' with RW and supervision with proper locking of R prosthesis in order to demonstrate safe household ambulation in order to improve independence with gait without need for power wheelchair    Baseline Tolerating distance, but speed is inapropriate for distance for energy conservation and safety. Making progress, but needs more improvement.01/07/20= Patient has B hip flex and R knee flex during ambulation 100 feet with RLE buckling x 1 and min assisst with RW    Time 12    Period Weeks    Status On-going    Target Date 01/21/20      PT LONG TERM GOAL #5   Title Patient will continue to maintain a FOTO score of 52 or higher to indicate improvement from initial evaluation in functional activities.    Baseline initial score 48, on 7/20 score 55, 01/07/20=40.02 sec    Time 8    Period Weeks    Status On-going    Target Date 01/21/20                 Plan - 02/11/20 0936    Clinical Impression Statement Patient instructed in gait training with RW with negociating turns, small space navigation and  different speeds with assistive device. Patient performs LE strengthening and mobility challenges without pain behaviors. Patient will continue to benefit from skilled PT to improve mobility and safety    Personal Factors and Comorbidities Age;Fitness;Past/Current Experience;Education;Transportation    Examination-Activity Limitations Transfers;Dressing;Squat;Stairs;Stand;Bed Mobility    Examination-Participation Restrictions Community Activity    Stability/Clinical Decision Making Stable/Uncomplicated    Rehab Potential Good    PT Frequency 2x / week    PT Duration 12 weeks    PT Treatment/Interventions ADLs/Self Care Home Management;Cryotherapy;Electrical Stimulation;DME Instruction;Gait training;Stair training;Functional mobility training;Therapeutic activities;Therapeutic exercise;Balance training;Neuromuscular re-education;Cognitive remediation;Patient/family education;Prosthetic Training;Wheelchair mobility training;Passive range of motion;Dry needling;Scar mobilization;Spinal Manipulations;Joint Manipulations    PT Next Visit Plan Targeted Rt hip extension strengthening in both end range flexion and end range extension ranges, hip rotation strengthening, standing balance adn postural control.    PT Home Exercise Plan Medbridge Access Code: [email or web address] (prone positioning), L single leg bridges, L sidelying R hip abduction, hip isometric adduction    Consulted and Agree with Plan of Care Patient           Patient will benefit from skilled therapeutic intervention in order to improve the following deficits and impairments:  Decreased activity tolerance, Decreased balance, Decreased mobility, Decreased strength, Decreased cognition, Decreased knowledge of use of DME, Decreased knowledge of precautions, Decreased endurance, Decreased range of motion, Difficulty walking, Decreased safety awareness, Decreased skin integrity, Decreased scar mobility, Hypomobility,  Postural dysfunction, Increased edema,  Increased muscle spasms, Increased fascial restricitons, Prosthetic Dependency, Impaired tone, Improper body mechanics  Visit Diagnosis: Difficulty in walking, not elsewhere classified  Unsteadiness on feet     Problem List Patient Active Problem List   Diagnosis Date Noted  . Cardiomyopathy ([place]) 01/22/2019  . CHF (congestive heart failure) ([place]) 01/22/2019  . Coronary disease 01/22/2019  . Above knee amputation of right lower extremity ([place]) 11/13/2018  . Leg ulcer, right, with fat layer exposed ([place]) 10/22/2018  . Cellulitis of right leg 07/21/2018  . Lower limb ulcer, calf, right, limited to breakdown of skin ([place]) 06/08/2018  . PVD (peripheral vascular disease) ([place]) 04/17/2018  . Malnutrition of moderate degree 04/11/2018  . Pressure injury of skin 04/06/2018  . Altered mental status 04/04/2018  . Hypothermia 04/04/2018  . Hemodialysis graft malfunction ([place]) 03/26/2018  . Hypercholesterolemia 02/27/2018  . Diabetes ([place]) 02/27/2018  . Weakness of right lower extremity 01/20/2018  . Fever   . Periumbilical abdominal pain   . Confusion 12/22/2017  . Acute delirium 12/21/2017  . Protein-calorie malnutrition, severe 12/19/2017  . Intractable nausea and vomiting 12/18/2017  . Lymphedema 12/13/2017  . Cellulitis 11/27/2017  . Chest pain 11/19/2017  . Atherosclerosis of native arteries of the extremities with ulceration ([place]) 11/07/2017  . Twitching 01/03/2017  . Elevated troponin 10/02/2015  . Complications, dialysis, catheter, mechanical ([place]) 10/02/2015  . Musculoskeletal chest pain 09/28/2015  . Chronic diastolic CHF (congestive heart failure) ([place]) 09/28/2015  . End stage renal disease ([place]) 10/09/2012  . Hypertension 10/09/2012  . Gout 10/09/2012    Alanson Puls, PT DPT 02/11/2020, 9:58 AM  [place] MAIN [facility] SERVICES [address] [place], [place], [address] Phone: [phone]   Fax:  [phone]  Name:  Johnny [name]. MRN: [id number] Date of Birth: Sep 18, 1950

## 2020-02-13 ENCOUNTER — Ambulatory Visit: Payer: Medicare Other | Admitting: Physical Therapy

## 2020-02-13 ENCOUNTER — Encounter: Payer: Self-pay | Admitting: Physical Therapy

## 2020-02-13 ENCOUNTER — Other Ambulatory Visit: Payer: Self-pay

## 2020-02-13 DIAGNOSIS — R262 Difficulty in walking, not elsewhere classified: Secondary | ICD-10-CM | POA: Diagnosis not present

## 2020-02-13 DIAGNOSIS — R2681 Unsteadiness on feet: Secondary | ICD-10-CM

## 2020-02-13 NOTE — Therapy (Signed)
[place] MAIN [facility] SERVICES [address] [place], [place], [address] Phone: [phone]   Fax:  [phone]  Physical Therapy Treatment  Patient Details  Name: Johnny [name]. MRN: [id number] Date of Birth: 23-Jan-1951 Referring Provider (PT): Eulogio Ditch, NP    Encounter Date: 02/13/2020   PT End of Session - 02/13/20 0944    Visit Number 63    Number of Visits 65    Date for PT Re-Evaluation 03/19/20    Authorization Type [employer] Medicare    Authorization Time Period 10/29/19-01/21/20: FOTO by PT (assessed 7/20 score 55)    PT Start Time 0845    PT Stop Time 0930    PT Time Calculation (min) 45 min    Equipment Utilized During Treatment Gait belt    Activity Tolerance Patient tolerated treatment well;No increased pain    Behavior During Therapy WFL for tasks assessed/performed           Past Medical History:  Diagnosis Date  . Anemia   . Anxiety   . CHF (congestive heart failure) ([place])   . Chronic kidney disease    esrd dialysis m/w/f  . Gout   . Hyperlipidemia   . Hypertension   . Myocardial infarction ([place]) 2010   10 years ago  . 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  . Peripheral vascular disease [facility])     Past Surgical History:  Procedure Laterality Date  . A/V FISTULAGRAM Right 09/06/2018   Procedure: A/V FISTULAGRAM;  Surgeon: Algernon Huxley, MD;  Location: [place] CV LAB;  Service: Cardiovascular;  Laterality: Right;  . A/V SHUNTOGRAM Left 06/21/2017   Procedure: A/V SHUNTOGRAM;  Surgeon: Katha Cabal, MD;  Location: [place] [facility];  Service: Cardiovascular;  Laterality: Left;  . A/V SHUNTOGRAM N/A 10/24/2018   Procedure: A/V SHUNTOGRAM;  Surgeon: Algernon Huxley, MD;  Location: [place] [facility];  Service: Cardiovascular;  Laterality: N/A;  . ABOVE KNEE LEG AMPUTATION Right 2020  . AMPUTATION Right 10/25/2018   Procedure: AMPUTATION ABOVE KNEE;  Surgeon: Algernon Huxley, MD;  Location: [facility] ORS;  Service: General;  Laterality: Right;  . APPLICATION OF WOUND VAC Right 04/11/2018   Procedure: APPLICATION OF WOUND VAC;  Surgeon: Algernon Huxley, MD;  Location: [facility] ORS;  Service: Vascular;  Laterality: Right;  . AV FISTULA PLACEMENT Left 09/18/2015   Procedure: INSERTION OF ARTERIOVENOUS (AV) GORE-TEX GRAFT ARM ( BRACH/AXILLARY GRAFT W/ INSTANT STICK GRAFT );  Surgeon: Katha Cabal, MD;  Location: [facility] ORS;  Service: Vascular;  Laterality: Left;  . AV FISTULA PLACEMENT Right 07/19/2018   Procedure: INSERTION OF ARTERIOVENOUS (AV) GORE-TEX GRAFT ARM ( BRACHIAL AXILLARY);  Surgeon: Algernon Huxley, MD;  Location: [facility] ORS;  Service: Vascular;  Laterality: Right;  . DIALYSIS FISTULA CREATION Right 10/2017   right chest perm cath  . DIALYSIS/PERMA CATHETER REMOVAL N/A 09/13/2018   Procedure: DIALYSIS/PERMA CATHETER REMOVAL;  Surgeon: Algernon Huxley, MD;  Location: [place] [facility];  Service: Cardiovascular;  Laterality: N/A;  . ESOPHAGOGASTRODUODENOSCOPY N/A 12/19/2017   Procedure: ESOPHAGOGASTRODUODENOSCOPY (EGD);  Surgeon: Lin Landsman, MD;  Location: [facility] ENDOSCOPY;  Service: Gastroenterology;  Laterality: N/A;  . LOWER EXTREMITY ANGIOGRAPHY Left 11/16/2017   Procedure: LOWER EXTREMITY ANGIOGRAPHY;  Surgeon: Algernon Huxley, MD;  Location: [place] [facility];  Service: Cardiovascular;  Laterality: Left;  . LOWER EXTREMITY ANGIOGRAPHY Right 01/18/2018   Procedure: LOWER EXTREMITY ANGIOGRAPHY;  Surgeon:  Algernon Huxley, MD;  Location: [place] [facility];  Service: Cardiovascular;  Laterality: Right;  . LOWER EXTREMITY ANGIOGRAPHY Left 04/02/2018   Procedure: LOWER EXTREMITY ANGIOGRAPHY;  Surgeon: Algernon Huxley, MD;  Location: [place] [facility];  Service: Cardiovascular;  Laterality: Left;  . LOWER EXTREMITY ANGIOGRAPHY Right 04/09/2018   Procedure: Lower Extremity Angiography with possible intervention;  Surgeon: Algernon Huxley, MD;  Location: [place] [facility];   Service: Cardiovascular;  Laterality: Right;  . LOWER EXTREMITY ANGIOGRAPHY Right 07/23/2018   Procedure: Lower Extremity Angiography;  Surgeon: Algernon Huxley, MD;  Location: [place] [facility];  Service: Cardiovascular;  Laterality: Right;  . LOWER EXTREMITY ANGIOGRAPHY Right 09/13/2018   Procedure: LOWER EXTREMITY ANGIOGRAPHY;  Surgeon: Algernon Huxley, MD;  Location: [place] [facility];  Service: Cardiovascular;  Laterality: Right;  . LOWER EXTREMITY VENOGRAPHY Right 09/13/2018   Procedure: LOWER EXTREMITY VENOGRAPHY;  Surgeon: Algernon Huxley, MD;  Location: [place] [facility];  Service: Cardiovascular;  Laterality: Right;  . PERIPHERAL VASCULAR CATHETERIZATION Left 09/01/2015   Procedure: A/V Shuntogram/Fistulagram;  Surgeon: Katha Cabal, MD;  Location: [place] [facility];  Service: Cardiovascular;  Laterality: Left;  . PERIPHERAL VASCULAR CATHETERIZATION N/A 09/30/2015   Procedure: A/V Shuntogram/Fistulagram with perm cathether removal;  Surgeon: Algernon Huxley, MD;  Location: [place] [facility];  Service: Cardiovascular;  Laterality: N/A;  . PERIPHERAL VASCULAR CATHETERIZATION Left 09/30/2015   Procedure: A/V Shunt Intervention;  Surgeon: Algernon Huxley, MD;  Location: [place] [facility];  Service: Cardiovascular;  Laterality: Left;  . PERIPHERAL VASCULAR CATHETERIZATION Left 12/03/2015   Procedure: Thrombectomy;  Surgeon: Algernon Huxley, MD;  Location: [place] [facility];  Service: Cardiovascular;  Laterality: Left;  . PERIPHERAL VASCULAR CATHETERIZATION Left 01/28/2016   Procedure: Thrombectomy;  Surgeon: Algernon Huxley, MD;  Location: [place] [facility];  Service: Cardiovascular;  Laterality: Left;  . PERIPHERAL VASCULAR CATHETERIZATION N/A 01/28/2016   Procedure: A/V Shuntogram/Fistulagram;  Surgeon: Algernon Huxley, MD;  Location: [place] [facility];  Service: Cardiovascular;  Laterality: N/A;  . SKIN SPLIT GRAFT Right 05/24/2018   Procedure: SKIN GRAFT SPLIT THICKNESS ( RIGHT CALF);  Surgeon:  Algernon Huxley, MD;  Location: [facility] ORS;  Service: Vascular;  Laterality: Right;  . UPPER EXTREMITY ANGIOGRAPHY  10/24/2018   Procedure: Upper Extremity Angiography;  Surgeon: Algernon Huxley, MD;  Location: [place] [facility];  Service: Cardiovascular;;  . UPPER EXTREMITY ANGIOGRAPHY Right 02/14/2019   Procedure: UPPER EXTREMITY ANGIOGRAPHY;  Surgeon: Algernon Huxley, MD;  Location: [place] [facility];  Service: Cardiovascular;  Laterality: Right;  . WOUND DEBRIDEMENT Right 04/11/2018   Procedure: DEBRIDEMENT WOUND calf muscle and skin;  Surgeon: Algernon Huxley, MD;  Location: [facility] OR[facility];  Service: Vascular;  Laterality: Right;    There were no vitals filed for this visit.   Subjective Assessment - 02/13/20 0943    Subjective Pt has no complaints, doing well this session.  Denies any fall since last therapy session.  Patient reports that the prosthetist is currently fashioning a new socket.  No specific questions or concerns.    Pertinent History Pt underwent Rt AKA amputation in June 2020 after difficulty with wound healing/infection. Pt DC hospital to SNF for rehab. He has since been back at home. Pt uses a manual WC or power chair for daily mobility. Pt takes tranportation services to HD MWF. Pt was recently fitted for his AKA prosthesis in Jan 2021.  Limitations Standing;Walking;House hold activities    How long can you sit comfortably? No difficulty    How long can you stand comfortably? 1 minute    How long can you walk comfortably? unable    Currently in Pain? No/denies    Pain Score 0-No pain           :   Treatment:  Gait training with RW 100 feet x2with cGA and RLE having difficulty with IR Gait training on TM x 1 min at . 1 miles/ hour with flexed trunk asecnding and descending 1 step to TM with mod assist and RW Transfer training sit to stand from power wc <> nu-step withCGA Transfer training from power wc <> stand with RW x 5 , cues for sequencing and safety 5 x sit to  stand with cues for sequencing and safety                        PT Education - 02/13/20 0943    Education Details HEP    Person(s) Educated Patient    Methods Explanation    Comprehension Verbalized understanding            PT Short Term Goals - 11/26/19 0956      PT SHORT TERM GOAL #1   Title Pt to demonstrate ability to perfrom STS transfer from chair c RW minGuard assist and lock his RLE knee joint without assist.    Baseline ModA from elevated surface at eval; 08/29/19: minA+1 with BUE support from regular height chair with arm rests; still requires elevated surface to perform. 7/20 able to perform STS from regular WC with RW and CGA, extended time needed to lock R knee joint and CGA for initialy steadying. highly reliant on UE    Time 4    Period Weeks    Status On-going    Target Date 12/24/19      PT SHORT TERM GOAL #2   Title Pt to demonstrate 5 degrees hip flexion 10 degrees hip abdct ROM.    Baseline lacks 5 degrees from neutral in both at eval; At visit 10, has 0 degrees flexion/extension; still lacking 5 degrees ABDCT neutral; 08/29/19: R hip: lacking 5 degrees of extension, 15 degrees abduction    Time 4    Period Weeks    Status Achieved    Target Date 06/25/19             PT Long Term Goals - 01/07/20 0935      PT LONG TERM GOAL #1   Title Pt to demonstrates 15 degrees Rt hip ABDCT, 10 degrees Rt hip extension P/ROM to facilitate prosthesis motor control.    Baseline 08/29/19: R hip: lacking 5 degrees of extension, 15 degrees abduction, 10/15/19= R hip lacking 10 deg extension; 7/20 goal deferred to next session  01/07/20+R hip lacking 10 deg extension    Time 8    Period Weeks    Status On-going    Target Date 01/21/20      PT LONG TERM GOAL #2   Title Pt to demonstrate 5/5 strength hip extension at 0 degrees flexion/extension    Baseline has 5/5 in some ranges, but not in ranges specific to standing or gait.; 08/29/19: Unable to get to  neutral flexion extension but 5/5 R hip extension in other ranges, 10/15/19= -3/5 right hip extension; 7/20 deferred to next session8/31/21=-3/5 right hip extension    Time 8    Period Weeks  Status On-going    Target Date 01/21/20      PT LONG TERM GOAL #3   Title Pt able to perfrom 5x STS from chair with RW, arms ad lib in <21sec    Baseline On 6/22 c RW and chair + 1 airex pad: 22.95sec (largely avoids use of RLE for power); 7/20, able to perform from standard WC height, 1:09seconds with BUE use.01/07/20=    Time 12    Period Weeks    Status On-going    Target Date 01/21/20      PT LONG TERM GOAL #4   Title Pt will be able to ambulate at least 100' with RW and supervision with proper locking of R prosthesis in order to demonstrate safe household ambulation in order to improve independence with gait without need for power wheelchair    Baseline Tolerating distance, but speed is inapropriate for distance for energy conservation and safety. Making progress, but needs more improvement.01/07/20= Patient has B hip flex and R knee flex during ambulation 100 feet with RLE buckling x 1 and min assisst with RW    Time 12    Period Weeks    Status On-going    Target Date 01/21/20      PT LONG TERM GOAL #5   Title Patient will continue to maintain a FOTO score of 52 or higher to indicate improvement from initial evaluation in functional activities.    Baseline initial score 48, on 7/20 score 55, 01/07/20=40.02 sec    Time 8    Period Weeks    Status On-going    Target Date 01/21/20                 Plan - 02/13/20 0944    Clinical Impression Statement Patient demonstrates decreased gait speed with deviations and requires verbal and tactile cueing to maintain center of gravity during ambulation.  Patient also requires CGA during TM walking at . 1 miles/ hour.. Patient requires consistent cueing to maintain correct position during gait activities. Patient demonstrates difficulty with gait  and slow locking of RLE prosthetic knee while on rollator.  Patient will continue to benefit from skilled therapy in order to improve dynamic standing balance and increase endurance.   Personal Factors and Comorbidities Age;Fitness;Past/Current Experience;Education;Transportation    Examination-Activity Limitations Transfers;Dressing;Squat;Stairs;Stand;Bed Mobility    Examination-Participation Restrictions Community Activity    Stability/Clinical Decision Making Stable/Uncomplicated    Rehab Potential Good    PT Frequency 2x / week    PT Duration 12 weeks    PT Treatment/Interventions ADLs/Self Care Home Management;Cryotherapy;Electrical Stimulation;DME Instruction;Gait training;Stair training;Functional mobility training;Therapeutic activities;Therapeutic exercise;Balance training;Neuromuscular re-education;Cognitive remediation;Patient/family education;Prosthetic Training;Wheelchair mobility training;Passive range of motion;Dry needling;Scar mobilization;Spinal Manipulations;Joint Manipulations    PT Next Visit Plan Targeted Rt hip extension strengthening in both end range flexion and end range extension ranges, hip rotation strengthening, standing balance adn postural control.    PT Home Exercise Plan Medbridge Access Code: [email or web address] (prone positioning), L single leg bridges, L sidelying R hip abduction, hip isometric adduction    Consulted and Agree with Plan of Care Patient           Patient will benefit from skilled therapeutic intervention in order to improve the following deficits and impairments:  Decreased activity tolerance, Decreased balance, Decreased mobility, Decreased strength, Decreased cognition, Decreased knowledge of use of DME, Decreased knowledge of precautions, Decreased endurance, Decreased range of motion, Difficulty walking, Decreased safety awareness, Decreased skin integrity, Decreased scar mobility, Hypomobility, Postural dysfunction, Increased edema, Increased  muscle  spasms, Increased fascial restricitons, Prosthetic Dependency, Impaired tone, Improper body mechanics  Visit Diagnosis: Difficulty in walking, not elsewhere classified  Unsteadiness on feet     Problem List Patient Active Problem List   Diagnosis Date Noted  . Cardiomyopathy ([place]) 01/22/2019  . CHF (congestive heart failure) ([place]) 01/22/2019  . Coronary disease 01/22/2019  . Above knee amputation of right lower extremity ([place]) 11/13/2018  . Leg ulcer, right, with fat layer exposed ([place]) 10/22/2018  . Cellulitis of right leg 07/21/2018  . Lower limb ulcer, calf, right, limited to breakdown of skin ([place]) 06/08/2018  . PVD (peripheral vascular disease) ([place]) 04/17/2018  . Malnutrition of moderate degree 04/11/2018  . Pressure injury of skin 04/06/2018  . Altered mental status 04/04/2018  . Hypothermia 04/04/2018  . Hemodialysis graft malfunction ([place]) 03/26/2018  . Hypercholesterolemia 02/27/2018  . Diabetes ([place]) 02/27/2018  . Weakness of right lower extremity 01/20/2018  . Fever   . Periumbilical abdominal pain   . Confusion 12/22/2017  . Acute delirium 12/21/2017  . Protein-calorie malnutrition, severe 12/19/2017  . Intractable nausea and vomiting 12/18/2017  . Lymphedema 12/13/2017  . Cellulitis 11/27/2017  . Chest pain 11/19/2017  . Atherosclerosis of native arteries of the extremities with ulceration ([place]) 11/07/2017  . Twitching 01/03/2017  . Elevated troponin 10/02/2015  . Complications, dialysis, catheter, mechanical ([place]) 10/02/2015  . Musculoskeletal chest pain 09/28/2015  . Chronic diastolic CHF (congestive heart failure) ([place]) 09/28/2015  . End stage renal disease ([place]) 10/09/2012  . Hypertension 10/09/2012  . Gout 10/09/2012    Alanson Puls, PT DPT 02/13/2020, 9:45 AM  [place] MAIN [facility] SERVICES [address] [place], [place], [address] Phone: [phone]   Fax:  [phone]  Name: Derrian Poli. MRN: [id number] Date of Birth: 1950/07/17

## 2020-02-18 ENCOUNTER — Other Ambulatory Visit: Payer: Self-pay

## 2020-02-18 ENCOUNTER — Ambulatory Visit: Payer: Medicare Other | Admitting: Physical Therapy

## 2020-02-18 ENCOUNTER — Encounter: Payer: Self-pay | Admitting: Physical Therapy

## 2020-02-18 DIAGNOSIS — R262 Difficulty in walking, not elsewhere classified: Secondary | ICD-10-CM

## 2020-02-18 DIAGNOSIS — R2681 Unsteadiness on feet: Secondary | ICD-10-CM

## 2020-02-18 NOTE — Therapy (Signed)
[place] MAIN [facility] SERVICES [address] [place], [place], [address] Phone: [phone]   Fax:  [phone]  Physical Therapy Treatment  Patient Details  Name: Johnny [name]. MRN: [id number] Date of Birth: 1950-09-27 Referring Provider (PT): Eulogio Ditch, NP    Encounter Date: 02/18/2020   PT End of Session - 02/18/20 0924    Visit Number 26    Number of Visits 48    Date for PT Re-Evaluation 03/19/20    Authorization Type [employer] Medicare    Authorization Time Period 10/29/19-01/21/20: FOTO by PT (assessed 7/20 score 29)    PT Start Time 0845    PT Stop Time 0930    PT Time Calculation (min) 45 min           Past Medical History:  Diagnosis Date   Anemia    Anxiety    CHF (congestive heart failure) ([place])    Chronic kidney disease    esrd dialysis m/w/f   Gout    Hyperlipidemia    Hypertension    Myocardial infarction ([place]) 2010   10 years ago   Neuromuscular disorder ([place]) 2020   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   Peripheral vascular disease [facility])     Past Surgical History:  Procedure Laterality Date   A/V FISTULAGRAM Right 09/06/2018   Procedure: A/V FISTULAGRAM;  Surgeon: Algernon Huxley, MD;  Location: [place] CV LAB;  Service: Cardiovascular;  Laterality: Right;   A/V SHUNTOGRAM Left 06/21/2017   Procedure: A/V SHUNTOGRAM;  Surgeon: Katha Cabal, MD;  Location: [place] CV LAB;  Service: Cardiovascular;  Laterality: Left;   A/V SHUNTOGRAM N/A 10/24/2018   Procedure: A/V SHUNTOGRAM;  Surgeon: Algernon Huxley, MD;  Location: [place] CV LAB;  Service: Cardiovascular;  Laterality: N/A;   ABOVE KNEE LEG AMPUTATION Right 2020   AMPUTATION Right 10/25/2018   Procedure: AMPUTATION ABOVE KNEE;  Surgeon: Algernon Huxley, MD;  Location: [facility] ORS;  Service: General;  Laterality: Right;   APPLICATION OF WOUND VAC Right 04/11/2018   Procedure: APPLICATION OF WOUND VAC;  Surgeon: Algernon Huxley, MD;   Location: [facility] ORS;  Service: Vascular;  Laterality: Right;   AV FISTULA PLACEMENT Left 09/18/2015   Procedure: INSERTION OF ARTERIOVENOUS (AV) GORE-TEX GRAFT ARM ( BRACH/AXILLARY GRAFT W/ INSTANT STICK GRAFT );  Surgeon: Katha Cabal, MD;  Location: [facility] ORS;  Service: Vascular;  Laterality: Left;   AV FISTULA PLACEMENT Right 07/19/2018   Procedure: INSERTION OF ARTERIOVENOUS (AV) GORE-TEX GRAFT ARM ( BRACHIAL AXILLARY);  Surgeon: Algernon Huxley, MD;  Location: [facility] ORS;  Service: Vascular;  Laterality: Right;   DIALYSIS FISTULA CREATION Right 10/2017   right chest perm cath   DIALYSIS/PERMA CATHETER REMOVAL N/A 09/13/2018   Procedure: DIALYSIS/PERMA CATHETER REMOVAL;  Surgeon: Algernon Huxley, MD;  Location: [place] CV LAB;  Service: Cardiovascular;  Laterality: N/A;   ESOPHAGOGASTRODUODENOSCOPY N/A 12/19/2017   Procedure: ESOPHAGOGASTRODUODENOSCOPY (EGD);  Surgeon: Lin Landsman, MD;  Location: [facility] ENDOSCOPY;  Service: Gastroenterology;  Laterality: N/A;   LOWER EXTREMITY ANGIOGRAPHY Left 11/16/2017   Procedure: LOWER EXTREMITY ANGIOGRAPHY;  Surgeon: Algernon Huxley, MD;  Location: [place] CV LAB;  Service: Cardiovascular;  Laterality: Left;   LOWER EXTREMITY ANGIOGRAPHY Right 01/18/2018   Procedure: LOWER EXTREMITY ANGIOGRAPHY;  Surgeon: Algernon Huxley, MD;  Location: [place] CV LAB;  Service: Cardiovascular;  Laterality: Right;   LOWER EXTREMITY ANGIOGRAPHY Left 04/02/2018   Procedure: LOWER EXTREMITY ANGIOGRAPHY;  Surgeon: Algernon Huxley, MD;  Location: [place] CV LAB;  Service: Cardiovascular;  Laterality: Left;   LOWER EXTREMITY ANGIOGRAPHY Right 04/09/2018   Procedure: Lower Extremity Angiography with possible intervention;  Surgeon: Algernon Huxley, MD;  Location: [place] CV LAB;  Service: Cardiovascular;  Laterality: Right;   LOWER EXTREMITY ANGIOGRAPHY Right 07/23/2018   Procedure: Lower Extremity Angiography;  Surgeon: Algernon Huxley, MD;  Location: [place] CV LAB;  Service: Cardiovascular;  Laterality: Right;   LOWER EXTREMITY ANGIOGRAPHY Right 09/13/2018   Procedure: LOWER EXTREMITY ANGIOGRAPHY;  Surgeon: Algernon Huxley, MD;  Location: [place] CV LAB;  Service: Cardiovascular;  Laterality: Right;   LOWER EXTREMITY VENOGRAPHY Right 09/13/2018   Procedure: LOWER EXTREMITY VENOGRAPHY;  Surgeon: Algernon Huxley, MD;  Location: [place] CV LAB;  Service: Cardiovascular;  Laterality: Right;   PERIPHERAL VASCULAR CATHETERIZATION Left 09/01/2015   Procedure: A/V Shuntogram/Fistulagram;  Surgeon: Katha Cabal, MD;  Location: [place] CV LAB;  Service: Cardiovascular;  Laterality: Left;   PERIPHERAL VASCULAR CATHETERIZATION N/A 09/30/2015   Procedure: A/V Shuntogram/Fistulagram with perm cathether removal;  Surgeon: Algernon Huxley, MD;  Location: [place] CV LAB;  Service: Cardiovascular;  Laterality: N/A;   PERIPHERAL VASCULAR CATHETERIZATION Left 09/30/2015   Procedure: A/V Shunt Intervention;  Surgeon: Algernon Huxley, MD;  Location: [place] CV LAB;  Service: Cardiovascular;  Laterality: Left;   PERIPHERAL VASCULAR CATHETERIZATION Left 12/03/2015   Procedure: Thrombectomy;  Surgeon: Algernon Huxley, MD;  Location: [place] CV LAB;  Service: Cardiovascular;  Laterality: Left;   PERIPHERAL VASCULAR CATHETERIZATION Left 01/28/2016   Procedure: Thrombectomy;  Surgeon: Algernon Huxley, MD;  Location: [place] CV LAB;  Service: Cardiovascular;  Laterality: Left;   PERIPHERAL VASCULAR CATHETERIZATION N/A 01/28/2016   Procedure: A/V Shuntogram/Fistulagram;  Surgeon: Algernon Huxley, MD;  Location: [place] CV LAB;  Service: Cardiovascular;  Laterality: N/A;   SKIN SPLIT GRAFT Right 05/24/2018   Procedure: SKIN GRAFT SPLIT THICKNESS ( RIGHT CALF);  Surgeon: Algernon Huxley, MD;  Location: [facility] ORS;  Service: Vascular;  Laterality: Right;   UPPER EXTREMITY ANGIOGRAPHY  10/24/2018   Procedure: Upper Extremity Angiography;  Surgeon: Algernon Huxley, MD;  Location: [place] CV LAB;  Service: Cardiovascular;;   UPPER EXTREMITY ANGIOGRAPHY Right 02/14/2019   Procedure: UPPER EXTREMITY ANGIOGRAPHY;  Surgeon: Algernon Huxley, MD;  Location: [place] CV LAB;  Service: Cardiovascular;  Laterality: Right;   WOUND DEBRIDEMENT Right 04/11/2018   Procedure: DEBRIDEMENT WOUND calf muscle and skin;  Surgeon: Algernon Huxley, MD;  Location: [facility] ORS;  Service: Vascular;  Laterality: Right;    There were no vitals filed for this visit.   Subjective Assessment - 02/18/20 0923    Subjective Pt has no complaints, doing well this session.  Denies any fall since last therapy session.  Patient reports that the prosthetist is currently fashioning a new socket.  No specific questions or concerns.    Pertinent History Pt underwent Rt AKA amputation in June 2020 after difficulty with wound healing/infection. Pt DC hospital to SNF for rehab. He has since been back at home. Pt uses a manual WC or power chair for daily mobility. Pt takes tranportation services to HD MWF. Pt was recently fitted for his AKA prosthesis in Jan 2021.    Limitations Standing;Walking;House hold activities    How long can you sit comfortably? No difficulty    How long can you stand comfortably? 1 minute  How long can you walk comfortably? unable              Treatment:  Gait training with RW 100 feet x2with cGA and RLE having difficulty with IR  Transfer training sit to stand from power wc <> nu-step withCGA Transfer training from power wc <> stand with RW x 5 , cues for sequencing and safety 5 x sit to standwith cues for sequencing and safety Patient performed with instruction, verbal cues, tactile cues of therapist: goal: increase tissue extensibility, promote proper posture, improve mobility                         PT Education - 02/18/20 0924    Education Details HEP    Person(s) Educated Patient    Methods Explanation     Comprehension Verbalized understanding            PT Short Term Goals - 11/26/19 0956      PT SHORT TERM GOAL #1   Title Pt to demonstrate ability to perfrom STS transfer from chair c RW minGuard assist and lock his RLE knee joint without assist.    Baseline ModA from elevated surface at eval; 08/29/19: minA+1 with BUE support from regular height chair with arm rests; still requires elevated surface to perform. 7/20 able to perform STS from regular WC with RW and CGA, extended time needed to lock R knee joint and CGA for initialy steadying. highly reliant on UE    Time 4    Period Weeks    Status On-going    Target Date 12/24/19      PT SHORT TERM GOAL #2   Title Pt to demonstrate 5 degrees hip flexion 10 degrees hip abdct ROM.    Baseline lacks 5 degrees from neutral in both at eval; At visit 10, has 0 degrees flexion/extension; still lacking 5 degrees ABDCT neutral; 08/29/19: R hip: lacking 5 degrees of extension, 15 degrees abduction    Time 4    Period Weeks    Status Achieved    Target Date 06/25/19             PT Long Term Goals - 02/18/20 0958      PT LONG TERM GOAL #1   Title Pt to demonstrates 15 degrees Rt hip ABDCT, 10 degrees Rt hip extension P/ROM to facilitate prosthesis motor control.    Baseline 08/29/19: R hip: lacking 5 degrees of extension, 15 degrees abduction, 10/15/19= R hip lacking 10 deg extension; 7/20 goal deferred to next session  01/07/20+R hip lacking 10 deg extension    Time 12    Period Weeks    Status On-going    Target Date 03/19/20      PT LONG TERM GOAL #2   Title Pt to demonstrate 5/5 strength hip extension at 0 degrees flexion/extension    Baseline has 5/5 in some ranges, but not in ranges specific to standing or gait.; 08/29/19: Unable to get to neutral flexion extension but 5/5 R hip extension in other ranges, 10/15/19= -3/5 right hip extension; 7/20 deferred to next session8/31/21=-3/5 right hip extension    Time 12    Period Weeks    Status  On-going    Target Date 03/19/20      PT LONG TERM GOAL #3   Title Pt able to perfrom 5x STS from chair with RW, arms ad lib in <21sec    Baseline On 6/22 c RW and chair +  1 airex pad: 22.95sec (largely avoids use of RLE for power); 7/20, able to perform from standard WC height, 1:09seconds with BUE use.01/07/20=    Time 12    Period Weeks    Status On-going    Target Date 03/19/20      PT LONG TERM GOAL #4   Title Pt will be able to ambulate at least 100' with RW and supervision with proper locking of R prosthesis in order to demonstrate safe household ambulation in order to improve independence with gait without need for power wheelchair    Baseline Tolerating distance, but speed is inapropriate for distance for energy conservation and safety. Making progress, but needs more improvement.01/07/20= Patient has B hip flex and R knee flex during ambulation 100 feet with RLE buckling x 1 and min assisst with RW    Time 12    Period Weeks    Status On-going    Target Date 03/19/20      PT LONG TERM GOAL #5   Title Patient will continue to maintain a FOTO score of 52 or higher to indicate improvement from initial evaluation in functional activities.    Baseline initial score 48, on 7/20 score 55, 01/07/20=40.02 sec    Time 12    Period Weeks    Status On-going    Target Date 03/19/20                 Plan - 02/18/20 0925    Clinical Impression Statement Patient instructed in gait training with RW with negociating turns, small space navigation and different speeds with assistive device. Patient performs LE strengthening and mobility challenges without pain behaviors. Patient will continue to benefit from skilled PT to improve mobility and safety    Personal Factors and Comorbidities Age;Fitness;Past/Current Experience;Education;Transportation    Examination-Activity Limitations Transfers;Dressing;Squat;Stairs;Stand;Bed Mobility    Examination-Participation Restrictions Community  Activity    Stability/Clinical Decision Making Stable/Uncomplicated    Rehab Potential Good    PT Frequency 2x / week    PT Duration 12 weeks    PT Treatment/Interventions ADLs/Self Care Home Management;Cryotherapy;Electrical Stimulation;DME Instruction;Gait training;Stair training;Functional mobility training;Therapeutic activities;Therapeutic exercise;Balance training;Neuromuscular re-education;Cognitive remediation;Patient/family education;Prosthetic Training;Wheelchair mobility training;Passive range of motion;Dry needling;Scar mobilization;Spinal Manipulations;Joint Manipulations    PT Next Visit Plan Targeted Rt hip extension strengthening in both end range flexion and end range extension ranges, hip rotation strengthening, standing balance adn postural control.    PT Home Exercise Plan Medbridge Access Code: [email or web address] (prone positioning), L single leg bridges, L sidelying R hip abduction, hip isometric adduction    Consulted and Agree with Plan of Care Patient           Patient will benefit from skilled therapeutic intervention in order to improve the following deficits and impairments:  Decreased activity tolerance, Decreased balance, Decreased mobility, Decreased strength, Decreased cognition, Decreased knowledge of use of DME, Decreased knowledge of precautions, Decreased endurance, Decreased range of motion, Difficulty walking, Decreased safety awareness, Decreased skin integrity, Decreased scar mobility, Hypomobility, Postural dysfunction, Increased edema, Increased muscle spasms, Increased fascial restricitons, Prosthetic Dependency, Impaired tone, Improper body mechanics  Visit Diagnosis: Difficulty in walking, not elsewhere classified  Unsteadiness on feet     Problem List Patient Active Problem List   Diagnosis Date Noted   Cardiomyopathy ([place]) 01/22/2019   CHF (congestive heart failure) ([place]) 01/22/2019   Coronary disease 01/22/2019   Above knee amputation of right  lower extremity ([place]) 11/13/2018   Leg ulcer, right, with fat layer exposed ([place]) 10/22/2018  Cellulitis of right leg 07/21/2018   Lower limb ulcer, calf, right, limited to breakdown of skin ([place]) 06/08/2018   PVD (peripheral vascular disease) ([place]) 04/17/2018   Malnutrition of moderate degree 04/11/2018   Pressure injury of skin 04/06/2018   Altered mental status 04/04/2018   Hypothermia 04/04/2018   Hemodialysis graft malfunction ([place]) 03/26/2018   Hypercholesterolemia 02/27/2018   Diabetes ([place]) 02/27/2018   Weakness of right lower extremity 01/20/2018   Fever    Periumbilical abdominal pain    Confusion 12/22/2017   Acute delirium 12/21/2017   Protein-calorie malnutrition, severe 12/19/2017   Intractable nausea and vomiting 12/18/2017   Lymphedema 12/13/2017   Cellulitis 11/27/2017   Chest pain 11/19/2017   Atherosclerosis of native arteries of the extremities with ulceration ([place]) 11/07/2017   Twitching 01/03/2017   Elevated troponin 44/31/5400   Complications, dialysis, catheter, mechanical ([facility]) 10/02/2015   Musculoskeletal chest pain 09/28/2015   Chronic diastolic CHF (congestive heart failure) ([place]) 09/28/2015   End stage renal disease ([place]) 10/09/2012   Hypertension 10/09/2012   Gout 10/09/2012    Alanson Puls, PT DPT 02/18/2020, 10:02 AM  [place] [address] [place], [place], [address] Phone: [phone]   Fax:  [phone]  Name: Johnny [name]. MRN: [id number] Date of Birth: July 24, 1950

## 2020-02-20 ENCOUNTER — Encounter: Payer: Self-pay | Admitting: Physical Therapy

## 2020-02-20 ENCOUNTER — Ambulatory Visit: Payer: Medicare Other | Admitting: Physical Therapy

## 2020-02-20 ENCOUNTER — Other Ambulatory Visit: Payer: Self-pay

## 2020-02-20 DIAGNOSIS — R262 Difficulty in walking, not elsewhere classified: Secondary | ICD-10-CM | POA: Diagnosis not present

## 2020-02-20 DIAGNOSIS — R2681 Unsteadiness on feet: Secondary | ICD-10-CM

## 2020-02-20 NOTE — Therapy (Signed)
[place] MAIN [facility] SERVICES [address] [place], [place], [address] Phone: [phone]   Fax:  [phone]  Physical Therapy Treatment  Patient Details  Name: Johnny [name]. MRN: [id number] Date of Birth: [birth year] Referring Provider (PT): Eulogio Ditch, NP    Encounter Date: 02/20/2020   PT End of Session - 02/20/20 0837    Visit Number [id number]    Number of Visits 65    Date for PT Re-Evaluation 03/19/20    Authorization Type [employer] Medicare    Authorization Time Period 10/29/19-01/21/20: FOTO by PT (assessed 7/20 score 55)    PT Start Time 0845    PT Stop Time 0930    PT Time Calculation (min) 45 min           Past Medical History:  Diagnosis Date  . Anemia   . Anxiety   . CHF (congestive heart failure) ([place])   . Chronic kidney disease    esrd dialysis m/w/f  . Gout   . Hyperlipidemia   . Hypertension   . Myocardial infarction ([place]) 2010   10 years ago  . 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  . Peripheral vascular disease [facility])     Past Surgical History:  Procedure Laterality Date  . A/V FISTULAGRAM Right 09/06/2018   Procedure: A/V FISTULAGRAM;  Surgeon: Algernon Huxley, MD;  Location: [place] CV LAB;  Service: Cardiovascular;  Laterality: Right;  . A/V SHUNTOGRAM Left 06/21/2017   Procedure: A/V SHUNTOGRAM;  Surgeon: Katha Cabal, MD;  Location: [place] CV LAB;  Service: Cardiovascular;  Laterality: Left;  . A/V SHUNTOGRAM N/A 10/24/2018   Procedure: A/V SHUNTOGRAM;  Surgeon: Algernon Huxley, MD;  Location: [place] CV LAB;  Service: Cardiovascular;  Laterality: N/A;  . ABOVE KNEE LEG AMPUTATION Right 2020  . AMPUTATION Right 10/25/2018   Procedure: AMPUTATION ABOVE KNEE;  Surgeon: Algernon Huxley, MD;  Location: [facility] ORS;  Service: General;  Laterality: Right;  . APPLICATION OF WOUND VAC Right 04/11/2018   Procedure: APPLICATION OF WOUND VAC;  Surgeon: Algernon Huxley, MD;   Location: [facility] ORS;  Service: Vascular;  Laterality: Right;  . AV FISTULA PLACEMENT Left 09/18/2015   Procedure: INSERTION OF ARTERIOVENOUS (AV) GORE-TEX GRAFT ARM ( BRACH/AXILLARY GRAFT W/ INSTANT STICK GRAFT );  Surgeon: Katha Cabal, MD;  Location: [facility] ORS;  Service: Vascular;  Laterality: Left;  . AV FISTULA PLACEMENT Right 07/19/2018   Procedure: INSERTION OF ARTERIOVENOUS (AV) GORE-TEX GRAFT ARM ( BRACHIAL AXILLARY);  Surgeon: Algernon Huxley, MD;  Location: [facility] ORS;  Service: Vascular;  Laterality: Right;  . DIALYSIS FISTULA CREATION Right 10/2017   right chest perm cath  . DIALYSIS/PERMA CATHETER REMOVAL N/A 09/13/2018   Procedure: DIALYSIS/PERMA CATHETER REMOVAL;  Surgeon: Algernon Huxley, MD;  Location: [place] CV LAB;  Service: Cardiovascular;  Laterality: N/A;  . ESOPHAGOGASTRODUODENOSCOPY N/A 12/19/2017   Procedure: ESOPHAGOGASTRODUODENOSCOPY (EGD);  Surgeon: Lin Landsman, MD;  Location: [facility] ENDOSCOPY;  Service: Gastroenterology;  Laterality: N/A;  . LOWER EXTREMITY ANGIOGRAPHY Left 11/16/2017   Procedure: LOWER EXTREMITY ANGIOGRAPHY;  Surgeon: Algernon Huxley, MD;  Location: [place] CV LAB;  Service: Cardiovascular;  Laterality: Left;  . LOWER EXTREMITY ANGIOGRAPHY Right 01/18/2018   Procedure: LOWER EXTREMITY ANGIOGRAPHY;  Surgeon: Algernon Huxley, MD;  Location: [place] CV LAB;  Service: Cardiovascular;  Laterality: Right;  . LOWER EXTREMITY ANGIOGRAPHY Left 04/02/2018   Procedure: LOWER EXTREMITY ANGIOGRAPHY;  Surgeon: Algernon Huxley, MD;  Location: [place] CV LAB;  Service: Cardiovascular;  Laterality: Left;  . LOWER EXTREMITY ANGIOGRAPHY Right 04/09/2018   Procedure: Lower Extremity Angiography with possible intervention;  Surgeon: Algernon Huxley, MD;  Location: [place] CV LAB;  Service: Cardiovascular;  Laterality: Right;  . LOWER EXTREMITY ANGIOGRAPHY Right 07/23/2018   Procedure: Lower Extremity Angiography;  Surgeon: Algernon Huxley, MD;  Location: [place] CV LAB;  Service: Cardiovascular;  Laterality: Right;  . LOWER EXTREMITY ANGIOGRAPHY Right 09/13/2018   Procedure: LOWER EXTREMITY ANGIOGRAPHY;  Surgeon: Algernon Huxley, MD;  Location: [place] CV LAB;  Service: Cardiovascular;  Laterality: Right;  . LOWER EXTREMITY VENOGRAPHY Right 09/13/2018   Procedure: LOWER EXTREMITY VENOGRAPHY;  Surgeon: Algernon Huxley, MD;  Location: [place] CV LAB;  Service: Cardiovascular;  Laterality: Right;  . PERIPHERAL VASCULAR CATHETERIZATION Left 09/01/2015   Procedure: A/V Shuntogram/Fistulagram;  Surgeon: Katha Cabal, MD;  Location: [place] CV LAB;  Service: Cardiovascular;  Laterality: Left;  . PERIPHERAL VASCULAR CATHETERIZATION N/A 09/30/2015   Procedure: A/V Shuntogram/Fistulagram with perm cathether removal;  Surgeon: Algernon Huxley, MD;  Location: [place] CV LAB;  Service: Cardiovascular;  Laterality: N/A;  . PERIPHERAL VASCULAR CATHETERIZATION Left 09/30/2015   Procedure: A/V Shunt Intervention;  Surgeon: Algernon Huxley, MD;  Location: [place] CV LAB;  Service: Cardiovascular;  Laterality: Left;  . PERIPHERAL VASCULAR CATHETERIZATION Left 12/03/2015   Procedure: Thrombectomy;  Surgeon: Algernon Huxley, MD;  Location: [place] CV LAB;  Service: Cardiovascular;  Laterality: Left;  . PERIPHERAL VASCULAR CATHETERIZATION Left 01/28/2016   Procedure: Thrombectomy;  Surgeon: Algernon Huxley, MD;  Location: [place] CV LAB;  Service: Cardiovascular;  Laterality: Left;  . PERIPHERAL VASCULAR CATHETERIZATION N/A 01/28/2016   Procedure: A/V Shuntogram/Fistulagram;  Surgeon: Algernon Huxley, MD;  Location: [place] CV LAB;  Service: Cardiovascular;  Laterality: N/A;  . SKIN SPLIT GRAFT Right 05/24/2018   Procedure: SKIN GRAFT SPLIT THICKNESS ( RIGHT CALF);  Surgeon: Algernon Huxley, MD;  Location: [facility] ORS;  Service: Vascular;  Laterality: Right;  . UPPER EXTREMITY ANGIOGRAPHY  10/24/2018   Procedure: Upper Extremity Angiography;  Surgeon: Algernon Huxley, MD;  Location: [place] CV LAB;  Service: Cardiovascular;;  . UPPER EXTREMITY ANGIOGRAPHY Right 02/14/2019   Procedure: UPPER EXTREMITY ANGIOGRAPHY;  Surgeon: Algernon Huxley, MD;  Location: [place] CV LAB;  Service: Cardiovascular;  Laterality: Right;  . WOUND DEBRIDEMENT Right 04/11/2018   Procedure: DEBRIDEMENT WOUND calf muscle and skin;  Surgeon: Algernon Huxley, MD;  Location: [facility] ORS;  Service: Vascular;  Laterality: Right;    There were no vitals filed for this visit.    Treatment:  Gait training with RW 100 feet x2with cGA and RLE having difficulty with IR Transfer training sit to stand from power wc <> nu-step withCGA Transfer training from power wc <> stand with RW x 5 , cues for sequencing and safety 5 x sit to stand with cues for sequencing and safety Patient performed with instruction, verbal cues, tactile cues of therapist: goal: increase tissue extensibility, promote proper posture, improve mobility                          PT Education - 02/20/20 0837    Education Details HEP    Person(s) Educated Patient    Methods Explanation    Comprehension Verbalized understanding  PT Short Term Goals - 11/26/19 0956      PT SHORT TERM GOAL #1   Title Pt to demonstrate ability to perfrom STS transfer from chair c RW minGuard assist and lock his RLE knee joint without assist.    Baseline ModA from elevated surface at eval; 08/29/19: minA+1 with BUE support from regular height chair with arm rests; still requires elevated surface to perform. 7/20 able to perform STS from regular WC with RW and CGA, extended time needed to lock R knee joint and CGA for initialy steadying. highly reliant on UE    Time 4    Period Weeks    Status On-going    Target Date 12/24/19      PT SHORT TERM GOAL #2   Title Pt to demonstrate 5 degrees hip flexion 10 degrees hip abdct ROM.    Baseline lacks 5 degrees from neutral in both at eval; At visit  10, has 0 degrees flexion/extension; still lacking 5 degrees ABDCT neutral; 08/29/19: R hip: lacking 5 degrees of extension, 15 degrees abduction    Time 4    Period Weeks    Status Achieved    Target Date 06/25/19             PT Long Term Goals - 02/18/20 0958      PT LONG TERM GOAL #1   Title Pt to demonstrates 15 degrees Rt hip ABDCT, 10 degrees Rt hip extension P/ROM to facilitate prosthesis motor control.    Baseline 08/29/19: R hip: lacking 5 degrees of extension, 15 degrees abduction, 10/15/19= R hip lacking 10 deg extension; 7/20 goal deferred to next session  01/07/20+R hip lacking 10 deg extension    Time 12    Period Weeks    Status On-going    Target Date 03/19/20      PT LONG TERM GOAL #2   Title Pt to demonstrate 5/5 strength hip extension at 0 degrees flexion/extension    Baseline has 5/5 in some ranges, but not in ranges specific to standing or gait.; 08/29/19: Unable to get to neutral flexion extension but 5/5 R hip extension in other ranges, 10/15/19= -3/5 right hip extension; 7/20 deferred to next session8/31/21=-3/5 right hip extension    Time 12    Period Weeks    Status On-going    Target Date 03/19/20      PT LONG TERM GOAL #3   Title Pt able to perfrom 5x STS from chair with RW, arms ad lib in <21sec    Baseline On 6/22 c RW and chair + 1 airex pad: 22.95sec (largely avoids use of RLE for power); 7/20, able to perform from standard WC height, 1:09seconds with BUE use.01/07/20=    Time 12    Period Weeks    Status On-going    Target Date 03/19/20      PT LONG TERM GOAL #4   Title Pt will be able to ambulate at least 100' with RW and supervision with proper locking of R prosthesis in order to demonstrate safe household ambulation in order to improve independence with gait without need for power wheelchair    Baseline Tolerating distance, but speed is inapropriate for distance for energy conservation and safety. Making progress, but needs more improvement.01/07/20=  Patient has B hip flex and R knee flex during ambulation 100 feet with RLE buckling x 1 and min assisst with RW    Time 12    Period Weeks    Status On-going  Target Date 03/19/20      PT LONG TERM GOAL #5   Title Patient will continue to maintain a FOTO score of 52 or higher to indicate improvement from initial evaluation in functional activities.    Baseline initial score 48, on 7/20 score 55, 01/07/20=40.02 sec    Time 12    Period Weeks    Status On-going    Target Date 03/19/20                 Plan - 02/20/20 0838    Clinical Impression Statement Patient performs mobiity training with VC for safety during transfers and with locking R knee during gait. Patient needs cues for upright posture. Patient will continue to benefit from skilled PT to improve mobility and strength.    Personal Factors and Comorbidities Age;Fitness;Past/Current Experience;Education;Transportation    Examination-Activity Limitations Transfers;Dressing;Squat;Stairs;Stand;Bed Mobility    Examination-Participation Restrictions Community Activity    Stability/Clinical Decision Making Stable/Uncomplicated    Rehab Potential Good    PT Frequency 2x / week    PT Duration 12 weeks    PT Treatment/Interventions ADLs/Self Care Home Management;Cryotherapy;Electrical Stimulation;DME Instruction;Gait training;Stair training;Functional mobility training;Therapeutic activities;Therapeutic exercise;Balance training;Neuromuscular re-education;Cognitive remediation;Patient/family education;Prosthetic Training;Wheelchair mobility training;Passive range of motion;Dry needling;Scar mobilization;Spinal Manipulations;Joint Manipulations    PT Next Visit Plan Targeted Rt hip extension strengthening in both end range flexion and end range extension ranges, hip rotation strengthening, standing balance adn postural control.    PT Home Exercise Plan Medbridge Access Code: [email or web address] (prone positioning), L single leg bridges, L  sidelying R hip abduction, hip isometric adduction    Consulted and Agree with Plan of Care Patient           Patient will benefit from skilled therapeutic intervention in order to improve the following deficits and impairments:  Decreased activity tolerance, Decreased balance, Decreased mobility, Decreased strength, Decreased cognition, Decreased knowledge of use of DME, Decreased knowledge of precautions, Decreased endurance, Decreased range of motion, Difficulty walking, Decreased safety awareness, Decreased skin integrity, Decreased scar mobility, Hypomobility, Postural dysfunction, Increased edema, Increased muscle spasms, Increased fascial restricitons, Prosthetic Dependency, Impaired tone, Improper body mechanics  Visit Diagnosis: Difficulty in walking, not elsewhere classified  Unsteadiness on feet     Problem List Patient Active Problem List   Diagnosis Date Noted  . Cardiomyopathy ([place]) 01/22/2019  . CHF (congestive heart failure) ([place]) 01/22/2019  . Coronary disease 01/22/2019  . Above knee amputation of right lower extremity ([place]) 11/13/2018  . Leg ulcer, right, with fat layer exposed ([place]) 10/22/2018  . Cellulitis of right leg 07/21/2018  . Lower limb ulcer, calf, right, limited to breakdown of skin ([place]) 06/08/2018  . PVD (peripheral vascular disease) ([place]) 04/17/2018  . Malnutrition of moderate degree 04/11/2018  . Pressure injury of skin 04/06/2018  . Altered mental status 04/04/2018  . Hypothermia 04/04/2018  . Hemodialysis graft malfunction ([place]) 03/26/2018  . Hypercholesterolemia 02/27/2018  . Diabetes ([place]) 02/27/2018  . Weakness of right lower extremity 01/20/2018  . Fever   . Periumbilical abdominal pain   . Confusion 12/22/2017  . Acute delirium 12/21/2017  . Protein-calorie malnutrition, severe 12/19/2017  . Intractable nausea and vomiting 12/18/2017  . Lymphedema 12/13/2017  . Cellulitis 11/27/2017  . Chest pain 11/19/2017  . 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 Twitching 01/03/2017  . Elevated troponin 10/02/2015  . Complications, dialysis, catheter, mechanical ([place]) 10/02/2015  . Musculoskeletal chest pain 09/28/2015  . Chronic diastolic CHF (congestive heart failure) (  [place]) 09/28/2015  . End stage renal disease ([place]) 10/09/2012  . Hypertension 10/09/2012  . Gout 10/09/2012    Alanson Puls, PT DPT 02/20/2020, 8:39 AM  [place] MAIN [facility] SERVICES [address] [place], [place], [address] Phone: [phone]   Fax:  [phone]  Name: Johnny [name]. MRN: [id number] Date of Birth: November 25, 1950

## 2020-02-25 ENCOUNTER — Ambulatory Visit: Payer: Medicare Other | Admitting: Physical Therapy

## 2020-02-25 ENCOUNTER — Encounter: Payer: Self-pay | Admitting: Physical Therapy

## 2020-02-25 ENCOUNTER — Other Ambulatory Visit: Payer: Self-pay

## 2020-02-25 DIAGNOSIS — R262 Difficulty in walking, not elsewhere classified: Secondary | ICD-10-CM | POA: Diagnosis not present

## 2020-02-25 DIAGNOSIS — R2681 Unsteadiness on feet: Secondary | ICD-10-CM

## 2020-02-25 NOTE — Therapy (Signed)
[place] MAIN [facility] [place] SERVICES [address] [place], [place], [address] Phone: [phone]   Fax:  [phone]  Physical Therapy Treatment Physical Therapy Progress Note   Dates of reporting period  01/07/20   to 02/25/20  Patient Details  Name: Johnny [name]. MRN: [id number] Date of Birth: 1950/09/04 Referring Provider (PT): Eulogio Ditch, NP    Encounter Date: 02/25/2020   PT End of Session - 02/25/20 0923    Visit Number 60    Number of Visits 65    Date for PT Re-Evaluation 03/19/20    Authorization Type [employer] Medicare    Authorization Time Period 10/29/19-01/21/20: FOTO by PT (assessed 7/20 score 55)           Past Medical History:  Diagnosis Date  . Anemia   . Anxiety   . CHF (congestive heart failure) ([place])   . Chronic kidney disease    esrd dialysis m/w/f  . Gout   . Hyperlipidemia   . Hypertension   . Myocardial infarction ([place]) 2010   10 years ago  . 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  . Peripheral vascular disease [facility])     Past Surgical History:  Procedure Laterality Date  . A/V FISTULAGRAM Right 09/06/2018   Procedure: A/V FISTULAGRAM;  Surgeon: Algernon Huxley, MD;  Location: [place] [facility] LAB;  Service: Cardiovascular;  Laterality: Right;  . A/V SHUNTOGRAM Left 06/21/2017   Procedure: A/V SHUNTOGRAM;  Surgeon: Katha Cabal, MD;  Location: [place] [facility] LAB;  Service: Cardiovascular;  Laterality: Left;  . A/V SHUNTOGRAM N/A 10/24/2018   Procedure: A/V SHUNTOGRAM;  Surgeon: Algernon Huxley, MD;  Location: [place] [facility] LAB;  Service: Cardiovascular;  Laterality: N/A;  . ABOVE KNEE LEG AMPUTATION Right 2020  . AMPUTATION Right 10/25/2018   Procedure: AMPUTATION ABOVE KNEE;  Surgeon: Algernon Huxley, MD;  Location: [facility] ORS;  Service: General;  Laterality: Right;  . APPLICATION OF WOUND VAC Right 04/11/2018   Procedure: APPLICATION OF WOUND VAC;  Surgeon: Algernon Huxley, MD;   Location: [facility] ORS;  Service: Vascular;  Laterality: Right;  . AV FISTULA PLACEMENT Left 09/18/2015   Procedure: INSERTION OF ARTERIOVENOUS (AV) GORE-TEX GRAFT ARM ( BRACH/AXILLARY GRAFT W/ INSTANT STICK GRAFT );  Surgeon: Katha Cabal, MD;  Location: [facility] ORS;  Service: Vascular;  Laterality: Left;  . AV FISTULA PLACEMENT Right 07/19/2018   Procedure: INSERTION OF ARTERIOVENOUS (AV) GORE-TEX GRAFT ARM ( BRACHIAL AXILLARY);  Surgeon: Algernon Huxley, MD;  Location: [facility] ORS;  Service: Vascular;  Laterality: Right;  . DIALYSIS FISTULA CREATION Right 10/2017   right chest perm cath  . DIALYSIS/PERMA CATHETER REMOVAL N/A 09/13/2018   Procedure: DIALYSIS/PERMA CATHETER REMOVAL;  Surgeon: Algernon Huxley, MD;  Location: [place] [facility];  Service: Cardiovascular;  Laterality: N/A;  . ESOPHAGOGASTRODUODENOSCOPY N/A 12/19/2017   Procedure: ESOPHAGOGASTRODUODENOSCOPY (EGD);  Surgeon: Lin Landsman, MD;  Location: [facility] ENDOSCOPY;  Service: Gastroenterology;  Laterality: N/A;  . LOWER EXTREMITY ANGIOGRAPHY Left 11/16/2017   Procedure: LOWER EXTREMITY ANGIOGRAPHY;  Surgeon: Algernon Huxley, MD;  Location: [place] [facility] LAB;  Service: Cardiovascular;  Laterality: Left;  . LOWER EXTREMITY ANGIOGRAPHY Right 01/18/2018   Procedure: LOWER EXTREMITY ANGIOGRAPHY;  Surgeon: Algernon Huxley, MD;  Location: [place] [facility];  Service: Cardiovascular;  Laterality: Right;  . LOWER EXTREMITY ANGIOGRAPHY Left 04/02/2018   Procedure: LOWER EXTREMITY ANGIOGRAPHY;  Surgeon: Algernon Huxley, MD;  Location:  [place] [facility];  Service: Cardiovascular;  Laterality: Left;  . LOWER EXTREMITY ANGIOGRAPHY Right 04/09/2018   Procedure: Lower Extremity Angiography with possible intervention;  Surgeon: Algernon Huxley, MD;  Location: [place] [facility];  Service: Cardiovascular;  Laterality: Right;  . LOWER EXTREMITY ANGIOGRAPHY Right 07/23/2018   Procedure: Lower Extremity Angiography;  Surgeon: Algernon Huxley, MD;  Location: [place] [facility];  Service: Cardiovascular;  Laterality: Right;  . LOWER EXTREMITY ANGIOGRAPHY Right 09/13/2018   Procedure: LOWER EXTREMITY ANGIOGRAPHY;  Surgeon: Algernon Huxley, MD;  Location: [place] [facility];  Service: Cardiovascular;  Laterality: Right;  . LOWER EXTREMITY VENOGRAPHY Right 09/13/2018   Procedure: LOWER EXTREMITY VENOGRAPHY;  Surgeon: Algernon Huxley, MD;  Location: [place] [facility] LAB;  Service: Cardiovascular;  Laterality: Right;  . PERIPHERAL VASCULAR CATHETERIZATION Left 09/01/2015   Procedure: A/V Shuntogram/Fistulagram;  Surgeon: Katha Cabal, MD;  Location: [place] [facility];  Service: Cardiovascular;  Laterality: Left;  . PERIPHERAL VASCULAR CATHETERIZATION N/A 09/30/2015   Procedure: A/V Shuntogram/Fistulagram with perm cathether removal;  Surgeon: Algernon Huxley, MD;  Location: [place] [facility];  Service: Cardiovascular;  Laterality: N/A;  . PERIPHERAL VASCULAR CATHETERIZATION Left 09/30/2015   Procedure: A/V Shunt Intervention;  Surgeon: Algernon Huxley, MD;  Location: [place] [facility];  Service: Cardiovascular;  Laterality: Left;  . PERIPHERAL VASCULAR CATHETERIZATION Left 12/03/2015   Procedure: Thrombectomy;  Surgeon: Algernon Huxley, MD;  Location: [place] [facility];  Service: Cardiovascular;  Laterality: Left;  . PERIPHERAL VASCULAR CATHETERIZATION Left 01/28/2016   Procedure: Thrombectomy;  Surgeon: Algernon Huxley, MD;  Location: [place] [facility];  Service: Cardiovascular;  Laterality: Left;  . PERIPHERAL VASCULAR CATHETERIZATION N/A 01/28/2016   Procedure: A/V Shuntogram/Fistulagram;  Surgeon: Algernon Huxley, MD;  Location: [place] [facility];  Service: Cardiovascular;  Laterality: N/A;  . SKIN SPLIT GRAFT Right 05/24/2018   Procedure: SKIN GRAFT SPLIT THICKNESS ( RIGHT CALF);  Surgeon: Algernon Huxley, MD;  Location: [facility] ORS;  Service: Vascular;  Laterality: Right;  . UPPER EXTREMITY ANGIOGRAPHY  10/24/2018   Procedure: Upper Extremity Angiography;  Surgeon: Algernon Huxley, MD;  Location: [place] [facility] LAB;  Service: Cardiovascular;;  . UPPER EXTREMITY ANGIOGRAPHY Right 02/14/2019   Procedure: UPPER EXTREMITY ANGIOGRAPHY;  Surgeon: Algernon Huxley, MD;  Location: [place] [facility];  Service: Cardiovascular;  Laterality: Right;  . WOUND DEBRIDEMENT Right 04/11/2018   Procedure: DEBRIDEMENT WOUND calf muscle and skin;  Surgeon: Algernon Huxley, MD;  Location: [facility] ORS;  Service: Vascular;  Laterality: Right;    There were no vitals filed for this visit.   Subjective Assessment - 02/25/20 0923    Subjective Pt has no complaints, doing well this session.  Denies any fall since last therapy session.  Patient reports that the prosthetist is currently fashioning a new socket.  No specific questions or concerns.    Pertinent History Pt underwent Rt AKA amputation in June 2020 after difficulty with wound healing/infection. Pt DC hospital to SNF for rehab. He has since been back at home. Pt uses a manual WC or power chair for daily mobility. Pt takes tranportation services to HD MWF. Pt was recently fitted for his AKA prosthesis in Jan 2021.    Limitations Standing;Walking;House hold activities    How long can you sit comfortably? No difficulty    How long can you stand comfortably? 1 minute    How long can you walk comfortably?  unable    Currently in Pain? No/denies    Pain Score 0-No pain          Therapeutic activities; Goals reviewed and progressed Nu-step x 10 mins with FOTO being done at the same time, L4  Gait training with RW 100 feet x2with cGA and RLE having difficulty with IR Transfer training sit to stand from power wc <> nu-step withCGA Transfer training from power wc <> stand with RW x 5 , cues for sequencing and safety 5 x sit to standwith cues for sequencing and safety Patient performed with instruction, verbal cues, tactile cues of therapist: goal:increase tissue extensibility, promote proper posture, improve  mobility                          PT Education - 02/25/20 0923    Education Details HEP    Person(s) Educated Patient    Methods Explanation    Comprehension Verbalized understanding;Returned demonstration;Need further instruction            PT Short Term Goals - 11/26/19 0956      PT SHORT TERM GOAL #1   Title Pt to demonstrate ability to perfrom STS transfer from chair c RW minGuard assist and lock his RLE knee joint without assist.    Baseline ModA from elevated surface at eval; 08/29/19: minA+1 with BUE support from regular height chair with arm rests; still requires elevated surface to perform. 7/20 able to perform STS from regular WC with RW and CGA, extended time needed to lock R knee joint and CGA for initialy steadying. highly reliant on UE    Time 4    Period Weeks    Status On-going    Target Date 12/24/19      PT SHORT TERM GOAL #2   Title Pt to demonstrate 5 degrees hip flexion 10 degrees hip abdct ROM.    Baseline lacks 5 degrees from neutral in both at eval; At visit 10, has 0 degrees flexion/extension; still lacking 5 degrees ABDCT neutral; 08/29/19: R hip: lacking 5 degrees of extension, 15 degrees abduction    Time 4    Period Weeks    Status Achieved    Target Date 06/25/19             PT Long Term Goals - 02/25/20 0944      PT LONG TERM GOAL #1   Title Pt to demonstrates 15 degrees Rt hip ABDCT, 10 degrees Rt hip extension P/ROM to facilitate prosthesis motor control.    Baseline 08/29/19: R hip: lacking 5 degrees of extension, 15 degrees abduction, 10/15/19= R hip lacking 10 deg extension; 7/20 goal deferred to next session  01/07/20+R hip lacking 10 deg extension    Time 12    Period Weeks    Status On-going    Target Date 03/19/20      PT LONG TERM GOAL #2   Title Pt to demonstrate 5/5 strength hip extension at 0 degrees flexion/extension    Baseline has 5/5 in some ranges, but not in ranges specific to standing or gait.; 08/29/19:  Unable to get to neutral flexion extension but 5/5 R hip extension in other ranges, 10/15/19= -3/5 right hip extension; 7/20 deferred to next session8/31/21=-3/5 right hip extension    Time 12    Period Weeks    Status On-going    Target Date 03/19/20      PT LONG TERM GOAL #3   Title Pt able  to perfrom 5x STS from chair with RW, arms ad lib in <21sec    Baseline On 6/22 c RW and chair + 1 airex pad: 22.95sec (largely avoids use of RLE for power); 7/20, able to perform from standard WC height, 1:09seconds with BUE use.01/07/20=    Time 12    Period Weeks    Status On-going    Target Date 03/19/20      PT LONG TERM GOAL #4   Title Pt will be able to ambulate at least 100' with RW and supervision with proper locking of R prosthesis in order to demonstrate safe household ambulation in order to improve independence with gait without need for power wheelchair    Baseline Tolerating distance, but speed is inapropriate for distance for energy conservation and safety. Making progress, but needs more improvement.01/07/20= Patient has B hip flex and R knee flex during ambulation 100 feet with RLE buckling x 1 and min assisst with RW    Time 12    Period Weeks    Status On-going    Target Date 03/19/20      PT LONG TERM GOAL #5   Title Patient will continue to maintain a FOTO score of 52 or higher to indicate improvement from initial evaluation in functional activities.    Baseline initial score 48, on 7/20 score 55, 01/07/20=40.02 sec, 02/25/20=52    Time 12    Period Weeks    Status On-going    Target Date 03/19/20                 Plan - 02/25/20 1610    Clinical Impression Statement Patient's condition has the potential to improve in response to therapy. Maximum improvement is yet to be obtained. The anticipated improvement is attainable and reasonable in a generally predictable time.  Patient reports that his functional mobility depends on how active he is during the day. His outcome  measures improved and his goals were reviewed and progressed. Patient will continue to benefit from skilled PT to improve mobility.     Personal Factors and Comorbidities Age;Fitness;Past/Current Experience;Education;Transportation    Examination-Activity Limitations Transfers;Dressing;Squat;Stairs;Stand;Bed Mobility    Examination-Participation Restrictions Community Activity    Stability/Clinical Decision Making Stable/Uncomplicated    Rehab Potential Good    PT Frequency 2x / week    PT Duration 12 weeks    PT Treatment/Interventions ADLs/Self Care Home Management;Cryotherapy;Electrical Stimulation;DME Instruction;Gait training;Stair training;Functional mobility training;Therapeutic activities;Therapeutic exercise;Balance training;Neuromuscular re-education;Cognitive remediation;Patient/family education;Prosthetic Training;Wheelchair mobility training;Passive range of motion;Dry needling;Scar mobilization;Spinal Manipulations;Joint Manipulations    PT Next Visit Plan Targeted Rt hip extension strengthening in both end range flexion and end range extension ranges, hip rotation strengthening, standing balance adn postural control.    PT Home Exercise Plan Medbridge Access Code: [email or web address] (prone positioning), L single leg bridges, L sidelying R hip abduction, hip isometric adduction    Consulted and Agree with Plan of Care Patient           Patient will benefit from skilled therapeutic intervention in order to improve the following deficits and impairments:  Decreased activity tolerance, Decreased balance, Decreased mobility, Decreased strength, Decreased cognition, Decreased knowledge of use of DME, Decreased knowledge of precautions, Decreased endurance, Decreased range of motion, Difficulty walking, Decreased safety awareness, Decreased skin integrity, Decreased scar mobility, Hypomobility, Postural dysfunction, Increased edema, Increased muscle spasms, Increased fascial restricitons, Prosthetic  Dependency, Impaired tone, Improper body mechanics  Visit Diagnosis: Difficulty in walking, not elsewhere classified  Unsteadiness on feet  Problem List Patient Active Problem List   Diagnosis Date Noted  . Cardiomyopathy ([place]) 01/22/2019  . CHF (congestive heart failure) ([place]) 01/22/2019  . Coronary disease 01/22/2019  . Above knee amputation of right lower extremity ([place]) 11/13/2018  . Leg ulcer, right, with fat layer exposed ([place]) 10/22/2018  . Cellulitis of right leg 07/21/2018  . Lower limb ulcer, calf, right, limited to breakdown of skin ([place]) 06/08/2018  . PVD (peripheral vascular disease) ([place]) 04/17/2018  . Malnutrition of moderate degree 04/11/2018  . Pressure injury of skin 04/06/2018  . Altered mental status 04/04/2018  . Hypothermia 04/04/2018  . Hemodialysis graft malfunction ([place]) 03/26/2018  . Hypercholesterolemia 02/27/2018  . Diabetes ([place]) 02/27/2018  . Weakness of right lower extremity 01/20/2018  . Fever   . Periumbilical abdominal pain   . Confusion 12/22/2017  . Acute delirium 12/21/2017  . Protein-calorie malnutrition, severe 12/19/2017  . Intractable nausea and vomiting 12/18/2017  . Lymphedema 12/13/2017  . Cellulitis 11/27/2017  . Chest pain 11/19/2017  . Atherosclerosis of native arteries of the extremities with ulceration ([place]) 11/07/2017  . Twitching 01/03/2017  . Elevated troponin 10/02/2015  . Complications, dialysis, catheter, mechanical ([place]) 10/02/2015  . Musculoskeletal chest pain 09/28/2015  . Chronic diastolic CHF (congestive heart failure) ([place]) 09/28/2015  . End stage renal disease ([place]) 10/09/2012  . Hypertension 10/09/2012  . Gout 10/09/2012    Alanson Puls, PT DPT 02/25/2020, 9:48 AM  [place] MAIN [facility] SERVICES [address] [place], [place], [address] Phone: [phone]   Fax:  [phone]  Name: Johnny [name]. MRN: [id number] Date of Birth: [birth year]

## 2020-02-27 ENCOUNTER — Encounter: Payer: Self-pay | Admitting: Physical Therapy

## 2020-02-27 ENCOUNTER — Other Ambulatory Visit: Payer: Self-pay

## 2020-02-27 ENCOUNTER — Ambulatory Visit: Payer: Medicare Other | Admitting: Physical Therapy

## 2020-02-27 DIAGNOSIS — R262 Difficulty in walking, not elsewhere classified: Secondary | ICD-10-CM | POA: Diagnosis not present

## 2020-02-27 DIAGNOSIS — R2681 Unsteadiness on feet: Secondary | ICD-10-CM

## 2020-02-27 NOTE — Therapy (Signed)
[place] MAIN [facility] SERVICES [address] [place], [place], [address] Phone: [phone]   Fax:  [phone]  Physical Therapy Treatment  Patient Details  Name: Johnny [name]. MRN: [id number] Date of Birth: 08/12/1950 Referring Provider (PT): Eulogio Ditch, NP    Encounter Date: 02/27/2020   PT End of Session - 02/27/20 1221    Visit Number 61    Number of Visits 65    Date for PT Re-Evaluation 03/19/20    Authorization Type [employer] Medicare    Authorization Time Period 10/29/19-01/21/20: FOTO by PT (assessed 7/20 score 55)    PT Start Time 0845    PT Stop Time 0930    PT Time Calculation (min) 45 min    Equipment Utilized During Treatment Gait belt    Activity Tolerance Patient tolerated treatment well;No increased pain    Behavior During Therapy WFL for tasks assessed/performed           Past Medical History:  Diagnosis Date  . Anemia   . Anxiety   . CHF (congestive heart failure) ([place])   . Chronic kidney disease    esrd dialysis m/w/f  . Gout   . Hyperlipidemia   . Hypertension   . Myocardial infarction ([place]) 2010   10 years ago  . 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  . Peripheral vascular disease [facility])     Past Surgical History:  Procedure Laterality Date  . A/V FISTULAGRAM Right 09/06/2018   Procedure: A/V FISTULAGRAM;  Surgeon: Algernon Huxley, MD;  Location: [place] CV LAB;  Service: Cardiovascular;  Laterality: Right;  . A/V SHUNTOGRAM Left 06/21/2017   Procedure: A/V SHUNTOGRAM;  Surgeon: Katha Cabal, MD;  Location: [place] [facility] LAB;  Service: Cardiovascular;  Laterality: Left;  . A/V SHUNTOGRAM N/A 10/24/2018   Procedure: A/V SHUNTOGRAM;  Surgeon: Algernon Huxley, MD;  Location: [place] CV LAB;  Service: Cardiovascular;  Laterality: N/A;  . ABOVE KNEE LEG AMPUTATION Right 2020  . AMPUTATION Right 10/25/2018   Procedure: AMPUTATION ABOVE KNEE;  Surgeon: Algernon Huxley, MD;  Location: [facility] ORS;  Service: General;  Laterality: Right;  . APPLICATION OF WOUND VAC Right 04/11/2018   Procedure: APPLICATION OF WOUND VAC;  Surgeon: Algernon Huxley, MD;  Location: [facility] ORS;  Service: Vascular;  Laterality: Right;  . AV FISTULA PLACEMENT Left 09/18/2015   Procedure: INSERTION OF ARTERIOVENOUS (AV) GORE-TEX GRAFT ARM ( BRACH/AXILLARY GRAFT W/ INSTANT STICK GRAFT );  Surgeon: Katha Cabal, MD;  Location: [facility] ORS;  Service: Vascular;  Laterality: Left;  . AV FISTULA PLACEMENT Right 07/19/2018   Procedure: INSERTION OF ARTERIOVENOUS (AV) GORE-TEX GRAFT ARM ( BRACHIAL AXILLARY);  Surgeon: Algernon Huxley, MD;  Location: [facility] ORS;  Service: Vascular;  Laterality: Right;  . DIALYSIS FISTULA CREATION Right 10/2017   right chest perm cath  . DIALYSIS/PERMA CATHETER REMOVAL N/A 09/13/2018   Procedure: DIALYSIS/PERMA CATHETER REMOVAL;  Surgeon: Algernon Huxley, MD;  Location: [place] CV LAB;  Service: Cardiovascular;  Laterality: N/A;  . ESOPHAGOGASTRODUODENOSCOPY N/A 12/19/2017   Procedure: ESOPHAGOGASTRODUODENOSCOPY (EGD);  Surgeon: Lin Landsman, MD;  Location: [facility] ENDOSCOPY;  Service: Gastroenterology;  Laterality: N/A;  . LOWER EXTREMITY ANGIOGRAPHY Left 11/16/2017   Procedure: LOWER EXTREMITY ANGIOGRAPHY;  Surgeon: Algernon Huxley, MD;  Location: [place] CV LAB;  Service: Cardiovascular;  Laterality: Left;  . LOWER EXTREMITY ANGIOGRAPHY Right 01/18/2018   Procedure: LOWER EXTREMITY ANGIOGRAPHY;  Surgeon:  Algernon Huxley, MD;  Location: [place] [facility] LAB;  Service: Cardiovascular;  Laterality: Right;  . LOWER EXTREMITY ANGIOGRAPHY Left 04/02/2018   Procedure: LOWER EXTREMITY ANGIOGRAPHY;  Surgeon: Algernon Huxley, MD;  Location: [place] [facility] LAB;  Service: Cardiovascular;  Laterality: Left;  . LOWER EXTREMITY ANGIOGRAPHY Right 04/09/2018   Procedure: Lower Extremity Angiography with possible intervention;  Surgeon: Algernon Huxley, MD;  Location: [place] [facility] LAB;   Service: Cardiovascular;  Laterality: Right;  . LOWER EXTREMITY ANGIOGRAPHY Right 07/23/2018   Procedure: Lower Extremity Angiography;  Surgeon: Algernon Huxley, MD;  Location: [place] CV LAB;  Service: Cardiovascular;  Laterality: Right;  . LOWER EXTREMITY ANGIOGRAPHY Right 09/13/2018   Procedure: LOWER EXTREMITY ANGIOGRAPHY;  Surgeon: Algernon Huxley, MD;  Location: [place] [facility] LAB;  Service: Cardiovascular;  Laterality: Right;  . LOWER EXTREMITY VENOGRAPHY Right 09/13/2018   Procedure: LOWER EXTREMITY VENOGRAPHY;  Surgeon: Algernon Huxley, MD;  Location: [place] CV LAB;  Service: Cardiovascular;  Laterality: Right;  . PERIPHERAL VASCULAR CATHETERIZATION Left 09/01/2015   Procedure: A/V Shuntogram/Fistulagram;  Surgeon: Katha Cabal, MD;  Location: [place] [facility] LAB;  Service: Cardiovascular;  Laterality: Left;  . PERIPHERAL VASCULAR CATHETERIZATION N/A 09/30/2015   Procedure: A/V Shuntogram/Fistulagram with perm cathether removal;  Surgeon: Algernon Huxley, MD;  Location: [place] [facility] LAB;  Service: Cardiovascular;  Laterality: N/A;  . PERIPHERAL VASCULAR CATHETERIZATION Left 09/30/2015   Procedure: A/V Shunt Intervention;  Surgeon: Algernon Huxley, MD;  Location: [place] CV LAB;  Service: Cardiovascular;  Laterality: Left;  . PERIPHERAL VASCULAR CATHETERIZATION Left 12/03/2015   Procedure: Thrombectomy;  Surgeon: Algernon Huxley, MD;  Location: [place] [facility] LAB;  Service: Cardiovascular;  Laterality: Left;  . PERIPHERAL VASCULAR CATHETERIZATION Left 01/28/2016   Procedure: Thrombectomy;  Surgeon: Algernon Huxley, MD;  Location: [place] [facility] LAB;  Service: Cardiovascular;  Laterality: Left;  . PERIPHERAL VASCULAR CATHETERIZATION N/A 01/28/2016   Procedure: A/V Shuntogram/Fistulagram;  Surgeon: Algernon Huxley, MD;  Location: [place] [facility] LAB;  Service: Cardiovascular;  Laterality: N/A;  . SKIN SPLIT GRAFT Right 05/24/2018   Procedure: SKIN GRAFT SPLIT THICKNESS ( RIGHT CALF);  Surgeon:  Algernon Huxley, MD;  Location: [facility] ORS;  Service: Vascular;  Laterality: Right;  . UPPER EXTREMITY ANGIOGRAPHY  10/24/2018   Procedure: Upper Extremity Angiography;  Surgeon: Algernon Huxley, MD;  Location: [place] CV LAB;  Service: Cardiovascular;;  . UPPER EXTREMITY ANGIOGRAPHY Right 02/14/2019   Procedure: UPPER EXTREMITY ANGIOGRAPHY;  Surgeon: Algernon Huxley, MD;  Location: [place] [facility] LAB;  Service: Cardiovascular;  Laterality: Right;  . WOUND DEBRIDEMENT Right 04/11/2018   Procedure: DEBRIDEMENT WOUND calf muscle and skin;  Surgeon: Algernon Huxley, MD;  Location: [facility] ORS;  Service: Vascular;  Laterality: Right;    There were no vitals filed for this visit.   Subjective Assessment - 02/27/20 1221    Subjective Pt has no complaints, doing well this session.  Denies any fall since last therapy session.  Patient reports that the prosthetist is currently fashioning a new socket.  No specific questions or concerns.    Pertinent History Pt underwent Rt AKA amputation in June 2020 after difficulty with wound healing/infection. Pt DC hospital to SNF for rehab. He has since been back at home. Pt uses a manual WC or power chair for daily mobility. Pt takes tranportation services to HD MWF. Pt was recently fitted for his AKA prosthesis in Jan 2021.  Limitations Standing;Walking;House hold activities    How long can you sit comfortably? No difficulty    How long can you stand comfortably? 1 minute    How long can you walk comfortably? unable    Currently in Pain? No/denies    Pain Score 0-No pain           Treatment:  Gait training with RW 100 feet x2with cGA and RLE having difficulty with IR difficulty with prosthesis slipping and needing new socks or socket work today at the prosthesis clinic  Transfer training sit to stand from power wc <> nu-step withCGA Transfer training from power wc <> stand with RW x 5 , cues for sequencing and safety 5 x sit to standwith cues for sequencing  and safety Patient performed with instruction, verbal cues, tactile cues of therapist: goal:increase tissue extensibility, promote proper posture, improve mobility                          PT Education - 02/27/20 1221    Education Details HEP    Person(s) Educated Patient    Methods Explanation    Comprehension Verbalized understanding            PT Short Term Goals - 11/26/19 0956      PT SHORT TERM GOAL #1   Title Pt to demonstrate ability to perfrom STS transfer from chair c RW minGuard assist and lock his RLE knee joint without assist.    Baseline ModA from elevated surface at eval; 08/29/19: minA+1 with BUE support from regular height chair with arm rests; still requires elevated surface to perform. 7/20 able to perform STS from regular WC with RW and CGA, extended time needed to lock R knee joint and CGA for initialy steadying. highly reliant on UE    Time 4    Period Weeks    Status On-going    Target Date 12/24/19      PT SHORT TERM GOAL #2   Title Pt to demonstrate 5 degrees hip flexion 10 degrees hip abdct ROM.    Baseline lacks 5 degrees from neutral in both at eval; At visit 10, has 0 degrees flexion/extension; still lacking 5 degrees ABDCT neutral; 08/29/19: R hip: lacking 5 degrees of extension, 15 degrees abduction    Time 4    Period Weeks    Status Achieved    Target Date 06/25/19             PT Long Term Goals - 02/25/20 0944      PT LONG TERM GOAL #1   Title Pt to demonstrates 15 degrees Rt hip ABDCT, 10 degrees Rt hip extension P/ROM to facilitate prosthesis motor control.    Baseline 08/29/19: R hip: lacking 5 degrees of extension, 15 degrees abduction, 10/15/19= R hip lacking 10 deg extension; 7/20 goal deferred to next session  01/07/20+R hip lacking 10 deg extension    Time 12    Period Weeks    Status On-going    Target Date 03/19/20      PT LONG TERM GOAL #2   Title Pt to demonstrate 5/5 strength hip extension at 0 degrees  flexion/extension    Baseline has 5/5 in some ranges, but not in ranges specific to standing or gait.; 08/29/19: Unable to get to neutral flexion extension but 5/5 R hip extension in other ranges, 10/15/19= -3/5 right hip extension; 7/20 deferred to next session8/31/21=-3/5 right hip extension    Time  12    Period Weeks    Status On-going    Target Date 03/19/20      PT LONG TERM GOAL #3   Title Pt able to perfrom 5x STS from chair with RW, arms ad lib in <21sec    Baseline On 6/22 c RW and chair + 1 airex pad: 22.95sec (largely avoids use of RLE for power); 7/20, able to perform from standard WC height, 1:09seconds with BUE use.01/07/20=    Time 12    Period Weeks    Status On-going    Target Date 03/19/20      PT LONG TERM GOAL #4   Title Pt will be able to ambulate at least 100' with RW and supervision with proper locking of R prosthesis in order to demonstrate safe household ambulation in order to improve independence with gait without need for power wheelchair    Baseline Tolerating distance, but speed is inapropriate for distance for energy conservation and safety. Making progress, but needs more improvement.01/07/20= Patient has B hip flex and R knee flex during ambulation 100 feet with RLE buckling x 1 and min assisst with RW    Time 12    Period Weeks    Status On-going    Target Date 03/19/20      PT LONG TERM GOAL #5   Title Patient will continue to maintain a FOTO score of 52 or higher to indicate improvement from initial evaluation in functional activities.    Baseline initial score 48, on 7/20 score 55, 01/07/20=40.02 sec, 02/25/20=52    Time 12    Period Weeks    Status On-going    Target Date 03/19/20                 Plan - 02/27/20 1222    Clinical Impression Statement Patient performs mobiity training with VC for safety during transfers and with locking R knee during gait. Patient needs cues for upright posture. Patient will continue to benefit from skilled PT to  improve mobility and strength.    Personal Factors and Comorbidities Age;Fitness;Past/Current Experience;Education;Transportation    Examination-Activity Limitations Transfers;Dressing;Squat;Stairs;Stand;Bed Mobility    Examination-Participation Restrictions Community Activity    Stability/Clinical Decision Making Stable/Uncomplicated    Rehab Potential Good    PT Frequency 2x / week    PT Duration 12 weeks    PT Treatment/Interventions ADLs/Self Care Home Management;Cryotherapy;Electrical Stimulation;DME Instruction;Gait training;Stair training;Functional mobility training;Therapeutic activities;Therapeutic exercise;Balance training;Neuromuscular re-education;Cognitive remediation;Patient/family education;Prosthetic Training;Wheelchair mobility training;Passive range of motion;Dry needling;Scar mobilization;Spinal Manipulations;Joint Manipulations    PT Next Visit Plan Targeted Rt hip extension strengthening in both end range flexion and end range extension ranges, hip rotation strengthening, standing balance adn postural control.    PT Home Exercise Plan Medbridge Access Code: [email or web address] (prone positioning), L single leg bridges, L sidelying R hip abduction, hip isometric adduction    Consulted and Agree with Plan of Care Patient           Patient will benefit from skilled therapeutic intervention in order to improve the following deficits and impairments:  Decreased activity tolerance, Decreased balance, Decreased mobility, Decreased strength, Decreased cognition, Decreased knowledge of use of DME, Decreased knowledge of precautions, Decreased endurance, Decreased range of motion, Difficulty walking, Decreased safety awareness, Decreased skin integrity, Decreased scar mobility, Hypomobility, Postural dysfunction, Increased edema, Increased muscle spasms, Increased fascial restricitons, Prosthetic Dependency, Impaired tone, Improper body mechanics  Visit Diagnosis: Difficulty in walking, not  elsewhere classified  Unsteadiness on feet     Problem  List Patient Active Problem List   Diagnosis Date Noted  . Cardiomyopathy ([place]) 01/22/2019  . CHF (congestive heart failure) ([place]) 01/22/2019  . Coronary disease 01/22/2019  . Above knee amputation of right lower extremity ([place]) 11/13/2018  . Leg ulcer, right, with fat layer exposed ([place]) 10/22/2018  . Cellulitis of right leg 07/21/2018  . Lower limb ulcer, calf, right, limited to breakdown of skin ([place]) 06/08/2018  . PVD (peripheral vascular disease) ([place]) 04/17/2018  . Malnutrition of moderate degree 04/11/2018  . Pressure injury of skin 04/06/2018  . Altered mental status 04/04/2018  . Hypothermia 04/04/2018  . Hemodialysis graft malfunction ([place]) 03/26/2018  . Hypercholesterolemia 02/27/2018  . Diabetes ([place]) 02/27/2018  . Weakness of right lower extremity 01/20/2018  . Fever   . Periumbilical abdominal pain   . Confusion 12/22/2017  . Acute delirium 12/21/2017  . Protein-calorie malnutrition, severe 12/19/2017  . Intractable nausea and vomiting 12/18/2017  . Lymphedema 12/13/2017  . Cellulitis 11/27/2017  . Chest pain 11/19/2017  . Atherosclerosis of native arteries of the extremities with ulceration ([place]) 11/07/2017  . Twitching 01/03/2017  . Elevated troponin 10/02/2015  . Complications, dialysis, catheter, mechanical ([place]) 10/02/2015  . Musculoskeletal chest pain 09/28/2015  . Chronic diastolic CHF (congestive heart failure) ([place]) 09/28/2015  . End stage renal disease ([place]) 10/09/2012  . Hypertension 10/09/2012  . Gout 10/09/2012    Alanson Puls, PT DPT 02/27/2020, 12:23 PM  [place] MAIN [facility] SERVICES [address] [place], [place], [address] Phone: [phone]   Fax:  [phone]  Name: Johnny [name]. MRN: [id number] Date of Birth: May 10, 1950

## 2020-03-03 ENCOUNTER — Other Ambulatory Visit: Payer: Self-pay

## 2020-03-03 ENCOUNTER — Ambulatory Visit: Payer: Medicare Other | Admitting: Physical Therapy

## 2020-03-03 DIAGNOSIS — R262 Difficulty in walking, not elsewhere classified: Secondary | ICD-10-CM | POA: Diagnosis not present

## 2020-03-03 DIAGNOSIS — R2681 Unsteadiness on feet: Secondary | ICD-10-CM

## 2020-03-03 NOTE — Therapy (Signed)
[place] MAIN [facility] SERVICES [address] [place], [place], [address] Phone: [phone]   Fax:  [phone]  Physical Therapy Treatment  Patient Details  Name: Johnny [name]. MRN: [id number] Date of Birth: 29-Nov-1950 Referring Provider (PT): Eulogio Ditch, NP    Encounter Date: 03/03/2020   PT End of Session - 03/03/20 1029    Visit Number 62    Number of Visits 65    Date for PT Re-Evaluation 03/19/20    Authorization Type [employer] Medicare    Authorization Time Period 10/29/19-01/21/20: FOTO by PT (assessed 7/20 score 55)    PT Start Time 0845    PT Stop Time 0930    PT Time Calculation (min) 45 min    Equipment Utilized During Treatment Gait belt    Activity Tolerance Patient tolerated treatment well;No increased pain    Behavior During Therapy WFL for tasks assessed/performed           Past Medical History:  Diagnosis Date   Anemia    Anxiety    CHF (congestive heart failure) ([place])    Chronic kidney disease    esrd dialysis m/w/f   Gout    Hyperlipidemia    Hypertension    Myocardial infarction ([place]) 2010   10 years ago   Neuromuscular disorder ([place]) 2020   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   Peripheral vascular disease [facility])     Past Surgical History:  Procedure Laterality Date   A/V FISTULAGRAM Right 09/06/2018   Procedure: A/V FISTULAGRAM;  Surgeon: Algernon Huxley, MD;  Location: [place] CV LAB;  Service: Cardiovascular;  Laterality: Right;   A/V SHUNTOGRAM Left 06/21/2017   Procedure: A/V SHUNTOGRAM;  Surgeon: Katha Cabal, MD;  Location: [place] CV LAB;  Service: Cardiovascular;  Laterality: Left;   A/V SHUNTOGRAM N/A 10/24/2018   Procedure: A/V SHUNTOGRAM;  Surgeon: Algernon Huxley, MD;  Location: [place] CV LAB;  Service: Cardiovascular;  Laterality: N/A;   ABOVE KNEE LEG AMPUTATION Right 2020   AMPUTATION Right 10/25/2018   Procedure: AMPUTATION ABOVE KNEE;  Surgeon: Algernon Huxley, MD;  Location: [facility] ORS;  Service: General;  Laterality: Right;   APPLICATION OF WOUND VAC Right 04/11/2018   Procedure: APPLICATION OF WOUND VAC;  Surgeon: Algernon Huxley, MD;  Location: [facility] ORS;  Service: Vascular;  Laterality: Right;   AV FISTULA PLACEMENT Left 09/18/2015   Procedure: INSERTION OF ARTERIOVENOUS (AV) GORE-TEX GRAFT ARM ( BRACH/AXILLARY GRAFT W/ INSTANT STICK GRAFT );  Surgeon: Katha Cabal, MD;  Location: [facility] ORS;  Service: Vascular;  Laterality: Left;   AV FISTULA PLACEMENT Right 07/19/2018   Procedure: INSERTION OF ARTERIOVENOUS (AV) GORE-TEX GRAFT ARM ( BRACHIAL AXILLARY);  Surgeon: Algernon Huxley, MD;  Location: [facility] ORS;  Service: Vascular;  Laterality: Right;   DIALYSIS FISTULA CREATION Right 10/2017   right chest perm cath   DIALYSIS/PERMA CATHETER REMOVAL N/A 09/13/2018   Procedure: DIALYSIS/PERMA CATHETER REMOVAL;  Surgeon: Algernon Huxley, MD;  Location: [place] CV LAB;  Service: Cardiovascular;  Laterality: N/A;   ESOPHAGOGASTRODUODENOSCOPY N/A 12/19/2017   Procedure: ESOPHAGOGASTRODUODENOSCOPY (EGD);  Surgeon: Lin Landsman, MD;  Location: [facility] ENDOSCOPY;  Service: Gastroenterology;  Laterality: N/A;   LOWER EXTREMITY ANGIOGRAPHY Left 11/16/2017   Procedure: LOWER EXTREMITY ANGIOGRAPHY;  Surgeon: Algernon Huxley, MD;  Location: [place] CV LAB;  Service: Cardiovascular;  Laterality: Left;   LOWER EXTREMITY ANGIOGRAPHY Right 01/18/2018   Procedure: LOWER EXTREMITY ANGIOGRAPHY;  Surgeon:  Algernon Huxley, MD;  Location: [place] CV LAB;  Service: Cardiovascular;  Laterality: Right;   LOWER EXTREMITY ANGIOGRAPHY Left 04/02/2018   Procedure: LOWER EXTREMITY ANGIOGRAPHY;  Surgeon: Algernon Huxley, MD;  Location: [place] CV LAB;  Service: Cardiovascular;  Laterality: Left;   LOWER EXTREMITY ANGIOGRAPHY Right 04/09/2018   Procedure: Lower Extremity Angiography with possible intervention;  Surgeon: Algernon Huxley, MD;  Location: [place] CV LAB;   Service: Cardiovascular;  Laterality: Right;   LOWER EXTREMITY ANGIOGRAPHY Right 07/23/2018   Procedure: Lower Extremity Angiography;  Surgeon: Algernon Huxley, MD;  Location: [place] CV LAB;  Service: Cardiovascular;  Laterality: Right;   LOWER EXTREMITY ANGIOGRAPHY Right 09/13/2018   Procedure: LOWER EXTREMITY ANGIOGRAPHY;  Surgeon: Algernon Huxley, MD;  Location: [place] [facility] LAB;  Service: Cardiovascular;  Laterality: Right;   LOWER EXTREMITY VENOGRAPHY Right 09/13/2018   Procedure: LOWER EXTREMITY VENOGRAPHY;  Surgeon: Algernon Huxley, MD;  Location: [place] CV LAB;  Service: Cardiovascular;  Laterality: Right;   PERIPHERAL VASCULAR CATHETERIZATION Left 09/01/2015   Procedure: A/V Shuntogram/Fistulagram;  Surgeon: Katha Cabal, MD;  Location: [place] [facility] LAB;  Service: Cardiovascular;  Laterality: Left;   PERIPHERAL VASCULAR CATHETERIZATION N/A 09/30/2015   Procedure: A/V Shuntogram/Fistulagram with perm cathether removal;  Surgeon: Algernon Huxley, MD;  Location: [place] CV LAB;  Service: Cardiovascular;  Laterality: N/A;   PERIPHERAL VASCULAR CATHETERIZATION Left 09/30/2015   Procedure: A/V Shunt Intervention;  Surgeon: Algernon Huxley, MD;  Location: [place] CV LAB;  Service: Cardiovascular;  Laterality: Left;   PERIPHERAL VASCULAR CATHETERIZATION Left 12/03/2015   Procedure: Thrombectomy;  Surgeon: Algernon Huxley, MD;  Location: [place] CV LAB;  Service: Cardiovascular;  Laterality: Left;   PERIPHERAL VASCULAR CATHETERIZATION Left 01/28/2016   Procedure: Thrombectomy;  Surgeon: Algernon Huxley, MD;  Location: [place] CV LAB;  Service: Cardiovascular;  Laterality: Left;   PERIPHERAL VASCULAR CATHETERIZATION N/A 01/28/2016   Procedure: A/V Shuntogram/Fistulagram;  Surgeon: Algernon Huxley, MD;  Location: [place] CV LAB;  Service: Cardiovascular;  Laterality: N/A;   SKIN SPLIT GRAFT Right 05/24/2018   Procedure: SKIN GRAFT SPLIT THICKNESS ( RIGHT CALF);  Surgeon:  Algernon Huxley, MD;  Location: [facility] ORS;  Service: Vascular;  Laterality: Right;   UPPER EXTREMITY ANGIOGRAPHY  10/24/2018   Procedure: Upper Extremity Angiography;  Surgeon: Algernon Huxley, MD;  Location: [place] CV LAB;  Service: Cardiovascular;;   UPPER EXTREMITY ANGIOGRAPHY Right 02/14/2019   Procedure: UPPER EXTREMITY ANGIOGRAPHY;  Surgeon: Algernon Huxley, MD;  Location: [place] [facility] LAB;  Service: Cardiovascular;  Laterality: Right;   WOUND DEBRIDEMENT Right 04/11/2018   Procedure: DEBRIDEMENT WOUND calf muscle and skin;  Surgeon: Algernon Huxley, MD;  Location: [facility] ORS;  Service: Vascular;  Laterality: Right;    There were no vitals filed for this visit.   Subjective Assessment - 03/03/20 1027    Subjective Pt has no complaints, doing well this session.  Denies any fall since last therapy session.  Patient reports that the prosthetist is currently fashioning a new socket.  No specific questions or concerns.    Pertinent History Pt underwent Rt AKA amputation in June 2020 after difficulty with wound healing/infection. Pt DC hospital to SNF for rehab. He has since been back at home. Pt uses a manual WC or power chair for daily mobility. Pt takes tranportation services to HD MWF. Pt was recently fitted for his AKA prosthesis in Jan 2021.  Limitations Standing;Walking;House hold activities    How long can you sit comfortably? No difficulty    How long can you stand comfortably? 1 minute    How long can you walk comfortably? unable    Currently in Pain? No/denies    Pain Score 0-No pain           Treatment:  Gait training with RW 100 feet x2with CGA and RLE being more stable due to recent fitting at prosthesis clinic, continues to have flexed trunk posture.  Standing hip abd BLE x 10 in parallel bars for support Standing marching x 10 in parallel bars for support Transfer training sit to stand from power wc <> nu-step withCGA Transfer training from power wc <> stand with RW  x 5 , cues for sequencing and safety 5 x sit to standwith cues for sequencing and safety Patient performed with instruction, verbal cues, tactile cues of therapist: goal:increase tissue extensibility, promote proper posture, improve mobility                          PT Education - 03/03/20 1027    Education Details gait secuencing and safety    Person(s) Educated Patient    Methods Explanation    Comprehension Verbalized understanding            PT Short Term Goals - 11/26/19 0956      PT SHORT TERM GOAL #1   Title Pt to demonstrate ability to perfrom STS transfer from chair c RW minGuard assist and lock his RLE knee joint without assist.    Baseline ModA from elevated surface at eval; 08/29/19: minA+1 with BUE support from regular height chair with arm rests; still requires elevated surface to perform. 7/20 able to perform STS from regular WC with RW and CGA, extended time needed to lock R knee joint and CGA for initialy steadying. highly reliant on UE    Time 4    Period Weeks    Status On-going    Target Date 12/24/19      PT SHORT TERM GOAL #2   Title Pt to demonstrate 5 degrees hip flexion 10 degrees hip abdct ROM.    Baseline lacks 5 degrees from neutral in both at eval; At visit 10, has 0 degrees flexion/extension; still lacking 5 degrees ABDCT neutral; 08/29/19: R hip: lacking 5 degrees of extension, 15 degrees abduction    Time 4    Period Weeks    Status Achieved    Target Date 06/25/19             PT Long Term Goals - 02/25/20 0944      PT LONG TERM GOAL #1   Title Pt to demonstrates 15 degrees Rt hip ABDCT, 10 degrees Rt hip extension P/ROM to facilitate prosthesis motor control.    Baseline 08/29/19: R hip: lacking 5 degrees of extension, 15 degrees abduction, 10/15/19= R hip lacking 10 deg extension; 7/20 goal deferred to next session  01/07/20+R hip lacking 10 deg extension    Time 12    Period Weeks    Status On-going    Target Date  03/19/20      PT LONG TERM GOAL #2   Title Pt to demonstrate 5/5 strength hip extension at 0 degrees flexion/extension    Baseline has 5/5 in some ranges, but not in ranges specific to standing or gait.; 08/29/19: Unable to get to neutral flexion extension but 5/5 R hip extension in  other ranges, 10/15/19= -3/5 right hip extension; 7/20 deferred to next session8/31/21=-3/5 right hip extension    Time 12    Period Weeks    Status On-going    Target Date 03/19/20      PT LONG TERM GOAL #3   Title Pt able to perfrom 5x STS from chair with RW, arms ad lib in <21sec    Baseline On 6/22 c RW and chair + 1 airex pad: 22.95sec (largely avoids use of RLE for power); 7/20, able to perform from standard WC height, 1:09seconds with BUE use.01/07/20=    Time 12    Period Weeks    Status On-going    Target Date 03/19/20      PT LONG TERM GOAL #4   Title Pt will be able to ambulate at least 100' with RW and supervision with proper locking of R prosthesis in order to demonstrate safe household ambulation in order to improve independence with gait without need for power wheelchair    Baseline Tolerating distance, but speed is inapropriate for distance for energy conservation and safety. Making progress, but needs more improvement.01/07/20= Patient has B hip flex and R knee flex during ambulation 100 feet with RLE buckling x 1 and min assisst with RW    Time 12    Period Weeks    Status On-going    Target Date 03/19/20      PT LONG TERM GOAL #5   Title Patient will continue to maintain a FOTO score of 52 or higher to indicate improvement from initial evaluation in functional activities.    Baseline initial score 48, on 7/20 score 55, 01/07/20=40.02 sec, 02/25/20=52    Time 12    Period Weeks    Status On-going    Target Date 03/19/20                 Plan - 03/03/20 1029    Clinical Impression Statement Patient performs mobiity training with VC for safety during transfers and with locking R knee  during gait. Patient needs cues for upright posture. Patient will continue to benefit from skilled PT to improve mobility and strength    Personal Factors and Comorbidities Age;Fitness;Past/Current Experience;Education;Transportation    Examination-Activity Limitations Transfers;Dressing;Squat;Stairs;Stand;Bed Mobility    Examination-Participation Restrictions Community Activity    Stability/Clinical Decision Making Stable/Uncomplicated    Rehab Potential Good    PT Frequency 2x / week    PT Duration 12 weeks    PT Treatment/Interventions ADLs/Self Care Home Management;Cryotherapy;Electrical Stimulation;DME Instruction;Gait training;Stair training;Functional mobility training;Therapeutic activities;Therapeutic exercise;Balance training;Neuromuscular re-education;Cognitive remediation;Patient/family education;Prosthetic Training;Wheelchair mobility training;Passive range of motion;Dry needling;Scar mobilization;Spinal Manipulations;Joint Manipulations    PT Next Visit Plan Targeted Rt hip extension strengthening in both end range flexion and end range extension ranges, hip rotation strengthening, standing balance adn postural control.    PT Home Exercise Plan Medbridge Access Code: [email or web address] (prone positioning), L single leg bridges, L sidelying R hip abduction, hip isometric adduction    Consulted and Agree with Plan of Care Patient           Patient will benefit from skilled therapeutic intervention in order to improve the following deficits and impairments:  Decreased activity tolerance, Decreased balance, Decreased mobility, Decreased strength, Decreased cognition, Decreased knowledge of use of DME, Decreased knowledge of precautions, Decreased endurance, Decreased range of motion, Difficulty walking, Decreased safety awareness, Decreased skin integrity, Decreased scar mobility, Hypomobility, Postural dysfunction, Increased edema, Increased muscle spasms, Increased fascial restricitons, Prosthetic  Dependency, Impaired tone, Improper body  mechanics  Visit Diagnosis: Difficulty in walking, not elsewhere classified  Unsteadiness on feet     Problem List Patient Active Problem List   Diagnosis Date Noted   Cardiomyopathy ([place]) 01/22/2019   CHF (congestive heart failure) ([place]) 01/22/2019   Coronary disease 01/22/2019   Above knee amputation of right lower extremity ([place]) 11/13/2018   Leg ulcer, right, with fat layer exposed ([place]) 10/22/2018   Cellulitis of right leg 07/21/2018   Lower limb ulcer, calf, right, limited to breakdown of skin ([place]) 06/08/2018   PVD (peripheral vascular disease) ([place]) 04/17/2018   Malnutrition of moderate degree 04/11/2018   Pressure injury of skin 04/06/2018   Altered mental status 04/04/2018   Hypothermia 04/04/2018   Hemodialysis graft malfunction ([place]) 03/26/2018   Hypercholesterolemia 02/27/2018   Diabetes ([place]) 02/27/2018   Weakness of right lower extremity 01/20/2018   Fever    Periumbilical abdominal pain    Confusion 12/22/2017   Acute delirium 12/21/2017   Protein-calorie malnutrition, severe 12/19/2017   Intractable nausea and vomiting 12/18/2017   Lymphedema 12/13/2017   Cellulitis 11/27/2017   Chest pain 11/19/2017   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 MRN: [id number] Date of Birth: 02/20/1951

## 2020-03-05 ENCOUNTER — Ambulatory Visit: Payer: Medicare Other | Admitting: Physical Therapy

## 2020-03-05 ENCOUNTER — Encounter: Payer: Self-pay | Admitting: Physical Therapy

## 2020-03-05 ENCOUNTER — Other Ambulatory Visit: Payer: Self-pay

## 2020-03-05 DIAGNOSIS — R262 Difficulty in walking, not elsewhere classified: Secondary | ICD-10-CM

## 2020-03-05 DIAGNOSIS — R2681 Unsteadiness on feet: Secondary | ICD-10-CM

## 2020-03-05 NOTE — Therapy (Signed)
[place] MAIN [facility] SERVICES [address] [place], [place], [address] Phone: [phone]   Fax:  [phone]  Physical Therapy Treatment  Patient Details  Name: Johnny [name]. MRN: [id number] Date of Birth: 19-May-1950 Referring Provider (PT): Eulogio Ditch, NP    Encounter Date: 03/05/2020   PT End of Session - 03/05/20 0858    Visit Number 63    Number of Visits 65    Date for PT Re-Evaluation 03/19/20    Authorization Type [employer] Medicare    Authorization Time Period 10/29/19-01/21/20: FOTO by PT (assessed 7/20 score 55)    PT Start Time 0830    PT Stop Time 0910    PT Time Calculation (min) 40 min    Equipment Utilized During Treatment Gait belt    Activity Tolerance Patient tolerated treatment well;No increased pain    Behavior During Therapy WFL for tasks assessed/performed           Past Medical History:  Diagnosis Date  . Anemia   . Anxiety   . CHF (congestive heart failure) ([place])   . Chronic kidney disease    esrd dialysis m/w/f  . Gout   . Hyperlipidemia   . Hypertension   . Myocardial infarction ([place]) 2010   10 years ago  . 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  . Peripheral vascular disease [facility])     Past Surgical History:  Procedure Laterality Date  . A/V FISTULAGRAM Right 09/06/2018   Procedure: A/V FISTULAGRAM;  Surgeon: Algernon Huxley, MD;  Location: [place] [facility];  Service: Cardiovascular;  Laterality: Right;  . A/V SHUNTOGRAM Left 06/21/2017   Procedure: A/V SHUNTOGRAM;  Surgeon: Katha Cabal, MD;  Location: [place] [facility];  Service: Cardiovascular;  Laterality: Left;  . A/V SHUNTOGRAM N/A 10/24/2018   Procedure: A/V SHUNTOGRAM;  Surgeon: Algernon Huxley, MD;  Location: [place] [facility];  Service: Cardiovascular;  Laterality: N/A;  . ABOVE KNEE LEG AMPUTATION Right 2020  . AMPUTATION Right 10/25/2018   Procedure: AMPUTATION ABOVE KNEE;  Surgeon: Algernon Huxley, MD;  Location: [facility] ORS;  Service: General;  Laterality: Right;  . APPLICATION OF WOUND VAC Right 04/11/2018   Procedure: APPLICATION OF WOUND VAC;  Surgeon: Algernon Huxley, MD;  Location: [facility] OR[facility];  Service: Vascular;  Laterality: Right;  . AV FISTULA PLACEMENT Left 09/18/2015   Procedure: INSERTION OF ARTERIOVENOUS (AV) GORE-TEX GRAFT ARM ( BRACH/AXILLARY GRAFT W/ INSTANT STICK GRAFT );  Surgeon: Katha Cabal, MD;  Location: [facility] OR[facility];  Service: Vascular;  Laterality: Left;  . AV FISTULA PLACEMENT Right 07/19/2018   Procedure: INSERTION OF ARTERIOVENOUS (AV) GORE-TEX GRAFT ARM ( BRACHIAL AXILLARY);  Surgeon: Algernon Huxley, MD;  Location: [facility] ORS;  Service: Vascular;  Laterality: Right;  . DIALYSIS FISTULA CREATION Right 10/2017   right chest perm cath  . DIALYSIS/PERMA CATHETER REMOVAL N/A 09/13/2018   Procedure: DIALYSIS/PERMA CATHETER REMOVAL;  Surgeon: Algernon Huxley, MD;  Location: [place] [facility];  Service: Cardiovascular;  Laterality: N/A;  . ESOPHAGOGASTRODUODENOSCOPY N/A 12/19/2017   Procedure: ESOPHAGOGASTRODUODENOSCOPY (EGD);  Surgeon: Lin Landsman, MD;  Location: [facility];  Service: Gastroenterology;  Laterality: N/A;  . LOWER EXTREMITY ANGIOGRAPHY Left 11/16/2017   Procedure: LOWER EXTREMITY ANGIOGRAPHY;  Surgeon: Algernon Huxley, MD;  Location: [place] [facility];  Service: Cardiovascular;  Laterality: Left;  . LOWER EXTREMITY ANGIOGRAPHY Right 01/18/2018   Procedure: LOWER EXTREMITY ANGIOGRAPHY;  Surgeon:  Algernon Huxley, MD;  Location: [place] [facility];  Service: Cardiovascular;  Laterality: Right;  . LOWER EXTREMITY ANGIOGRAPHY Left 04/02/2018   Procedure: LOWER EXTREMITY ANGIOGRAPHY;  Surgeon: Algernon Huxley, MD;  Location: [place] [facility];  Service: Cardiovascular;  Laterality: Left;  . LOWER EXTREMITY ANGIOGRAPHY Right 04/09/2018   Procedure: Lower Extremity Angiography with possible intervention;  Surgeon: Algernon Huxley, MD;  Location: [place] [facility];   Service: Cardiovascular;  Laterality: Right;  . LOWER EXTREMITY ANGIOGRAPHY Right 07/23/2018   Procedure: Lower Extremity Angiography;  Surgeon: Algernon Huxley, MD;  Location: [place] [facility];  Service: Cardiovascular;  Laterality: Right;  . LOWER EXTREMITY ANGIOGRAPHY Right 09/13/2018   Procedure: LOWER EXTREMITY ANGIOGRAPHY;  Surgeon: Algernon Huxley, MD;  Location: [place] [facility];  Service: Cardiovascular;  Laterality: Right;  . LOWER EXTREMITY VENOGRAPHY Right 09/13/2018   Procedure: LOWER EXTREMITY VENOGRAPHY;  Surgeon: Algernon Huxley, MD;  Location: [place] [facility];  Service: Cardiovascular;  Laterality: Right;  . PERIPHERAL VASCULAR CATHETERIZATION Left 09/01/2015   Procedure: A/V Shuntogram/Fistulagram;  Surgeon: Katha Cabal, MD;  Location: [place] [facility];  Service: Cardiovascular;  Laterality: Left;  . PERIPHERAL VASCULAR CATHETERIZATION N/A 09/30/2015   Procedure: A/V Shuntogram/Fistulagram with perm cathether removal;  Surgeon: Algernon Huxley, MD;  Location: [place] [facility];  Service: Cardiovascular;  Laterality: N/A;  . PERIPHERAL VASCULAR CATHETERIZATION Left 09/30/2015   Procedure: A/V Shunt Intervention;  Surgeon: Algernon Huxley, MD;  Location: [place] [facility];  Service: Cardiovascular;  Laterality: Left;  . PERIPHERAL VASCULAR CATHETERIZATION Left 12/03/2015   Procedure: Thrombectomy;  Surgeon: Algernon Huxley, MD;  Location: [place] [facility];  Service: Cardiovascular;  Laterality: Left;  . PERIPHERAL VASCULAR CATHETERIZATION Left 01/28/2016   Procedure: Thrombectomy;  Surgeon: Algernon Huxley, MD;  Location: [place] [facility];  Service: Cardiovascular;  Laterality: Left;  . PERIPHERAL VASCULAR CATHETERIZATION N/A 01/28/2016   Procedure: A/V Shuntogram/Fistulagram;  Surgeon: Algernon Huxley, MD;  Location: [place] [facility];  Service: Cardiovascular;  Laterality: N/A;  . SKIN SPLIT GRAFT Right 05/24/2018   Procedure: SKIN GRAFT SPLIT THICKNESS ( RIGHT CALF);  Surgeon:  Algernon Huxley, MD;  Location: [facility] OR[facility];  Service: Vascular;  Laterality: Right;  . UPPER EXTREMITY ANGIOGRAPHY  10/24/2018   Procedure: Upper Extremity Angiography;  Surgeon: Algernon Huxley, MD;  Location: [place] [facility];  Service: Cardiovascular;;  . UPPER EXTREMITY ANGIOGRAPHY Right 02/14/2019   Procedure: UPPER EXTREMITY ANGIOGRAPHY;  Surgeon: Algernon Huxley, MD;  Location: [place] [facility];  Service: Cardiovascular;  Laterality: Right;  . WOUND DEBRIDEMENT Right 04/11/2018   Procedure: DEBRIDEMENT WOUND calf muscle and skin;  Surgeon: Algernon Huxley, MD;  Location: [facility];  Service: Vascular;  Laterality: Right;    There were no vitals filed for this visit.   Subjective Assessment - 03/05/20 0857    Subjective Pt has no complaints, doing well this session.  Denies any fall since last therapy session.  Patient reports that the prosthetist is currently fashioning a new socket.  No specific questions or concerns.    Pertinent History Pt underwent Rt AKA amputation in June 2020 after difficulty with wound healing/infection. Pt DC hospital to SNF for rehab. He has since been back at home. Pt uses a manual WC or power chair for daily mobility. Pt takes tranportation services to HD MWF. Pt was recently fitted for his AKA prosthesis in Jan 2021.  Limitations Standing;Walking;House hold activities    How long can you sit comfortably? No difficulty    How long can you stand comfortably? 1 minute    How long can you walk comfortably? unable    Currently in Pain? No/denies    Pain Score 0-No pain           Treatment:  Gait training with RW 100 feet x2with CGA and RLE being more stable due to recent fitting at prosthesis clinic, continues to have flexed trunk posture.  Standing hip abd BLE x 10 in parallel bars for support Standing marching x 10 in parallel bars for support Transfer training sit to stand from power wc <> nu-step withCGA Transfer training from power wc <> stand with RW x  5 , cues for sequencing and safety 5 x sit to standwith cues for sequencing and safety Patient performed with instruction, verbal cues, tactile cues of therapist: goal:increase tissue extensibility, promote proper posture, improve mobility                           PT Education - 03/05/20 0858    Education Details Gait sequencing with RW    Person(s) Educated Patient    Methods Explanation    Comprehension Verbalized understanding            PT Short Term Goals - 11/26/19 0956      PT SHORT TERM GOAL #1   Title Pt to demonstrate ability to perfrom STS transfer from chair c RW minGuard assist and lock his RLE knee joint without assist.    Baseline ModA from elevated surface at eval; 08/29/19: minA+1 with BUE support from regular height chair with arm rests; still requires elevated surface to perform. 7/20 able to perform STS from regular WC with RW and CGA, extended time needed to lock R knee joint and CGA for initialy steadying. highly reliant on UE    Time 4    Period Weeks    Status On-going    Target Date 12/24/19      PT SHORT TERM GOAL #2   Title Pt to demonstrate 5 degrees hip flexion 10 degrees hip abdct ROM.    Baseline lacks 5 degrees from neutral in both at eval; At visit 10, has 0 degrees flexion/extension; still lacking 5 degrees ABDCT neutral; 08/29/19: R hip: lacking 5 degrees of extension, 15 degrees abduction    Time 4    Period Weeks    Status Achieved    Target Date 06/25/19             PT Long Term Goals - 02/25/20 0944      PT LONG TERM GOAL #1   Title Pt to demonstrates 15 degrees Rt hip ABDCT, 10 degrees Rt hip extension P/ROM to facilitate prosthesis motor control.    Baseline 08/29/19: R hip: lacking 5 degrees of extension, 15 degrees abduction, 10/15/19= R hip lacking 10 deg extension; 7/20 goal deferred to next session  01/07/20+R hip lacking 10 deg extension    Time 12    Period Weeks    Status On-going    Target Date  03/19/20      PT LONG TERM GOAL #2   Title Pt to demonstrate 5/5 strength hip extension at 0 degrees flexion/extension    Baseline has 5/5 in some ranges, but not in ranges specific to standing or gait.; 08/29/19: Unable to get to neutral flexion extension but 5/5 R hip extension  in other ranges, 10/15/19= -3/5 right hip extension; 7/20 deferred to next session8/31/21=-3/5 right hip extension    Time 12    Period Weeks    Status On-going    Target Date 03/19/20      PT LONG TERM GOAL #3   Title Pt able to perfrom 5x STS from chair with RW, arms ad lib in <21sec    Baseline On 6/22 c RW and chair + 1 airex pad: 22.95sec (largely avoids use of RLE for power); 7/20, able to perform from standard WC height, 1:09seconds with BUE use.01/07/20=    Time 12    Period Weeks    Status On-going    Target Date 03/19/20      PT LONG TERM GOAL #4   Title Pt will be able to ambulate at least 100' with RW and supervision with proper locking of R prosthesis in order to demonstrate safe household ambulation in order to improve independence with gait without need for power wheelchair    Baseline Tolerating distance, but speed is inapropriate for distance for energy conservation and safety. Making progress, but needs more improvement.01/07/20= Patient has B hip flex and R knee flex during ambulation 100 feet with RLE buckling x 1 and min assisst with RW    Time 12    Period Weeks    Status On-going    Target Date 03/19/20      PT LONG TERM GOAL #5   Title Patient will continue to maintain a FOTO score of 52 or higher to indicate improvement from initial evaluation in functional activities.    Baseline initial score 48, on 7/20 score 55, 01/07/20=40.02 sec, 02/25/20=52    Time 12    Period Weeks    Status On-going    Target Date 03/19/20                 Plan - 03/05/20 0859    Clinical Impression Statement Patient performs gait with BRW with vC for sequencing and better step length and better knee  locking into extension. Patient performs standing exercises in parallel bars with cues for posture and technique. Patient will continue to benefit from skilled PT to improve mobility and strength.    Personal Factors and Comorbidities Age;Fitness;Past/Current Experience;Education;Transportation    Examination-Activity Limitations Transfers;Dressing;Squat;Stairs;Stand;Bed Mobility    Examination-Participation Restrictions Community Activity    Stability/Clinical Decision Making Stable/Uncomplicated    Rehab Potential Good    PT Frequency 2x / week    PT Duration 12 weeks    PT Treatment/Interventions ADLs/Self Care Home Management;Cryotherapy;Electrical Stimulation;DME Instruction;Gait training;Stair training;Functional mobility training;Therapeutic activities;Therapeutic exercise;Balance training;Neuromuscular re-education;Cognitive remediation;Patient/family education;Prosthetic Training;Wheelchair mobility training;Passive range of motion;Dry needling;Scar mobilization;Spinal Manipulations;Joint Manipulations    PT Next Visit Plan Targeted Rt hip extension strengthening in both end range flexion and end range extension ranges, hip rotation strengthening, standing balance adn postural control.    PT Home Exercise Plan Medbridge Access Code: [email or web address] (prone positioning), L single leg bridges, L sidelying R hip abduction, hip isometric adduction    Consulted and Agree with Plan of Care Patient           Patient will benefit from skilled therapeutic intervention in order to improve the following deficits and impairments:  Decreased activity tolerance, Decreased balance, Decreased mobility, Decreased strength, Decreased cognition, Decreased knowledge of use of DME, Decreased knowledge of precautions, Decreased endurance, Decreased range of motion, Difficulty walking, Decreased safety awareness, Decreased skin integrity, Decreased scar mobility, Hypomobility, Postural dysfunction, Increased edema,  Increased muscle  spasms, Increased fascial restricitons, Prosthetic Dependency, Impaired tone, Improper body mechanics  Visit Diagnosis: Difficulty in walking, not elsewhere classified  Unsteadiness on feet     Problem List Patient Active Problem List   Diagnosis Date Noted  . Cardiomyopathy ([place]) 01/22/2019  . CHF (congestive heart failure) ([place]) 01/22/2019  . Coronary disease 01/22/2019  . Above knee amputation of right lower extremity ([place]) 11/13/2018  . Leg ulcer, right, with fat layer exposed ([place]) 10/22/2018  . Cellulitis of right leg 07/21/2018  . Lower limb ulcer, calf, right, limited to breakdown of skin ([place]) 06/08/2018  . PVD (peripheral vascular disease) ([place]) 04/17/2018  . Malnutrition of moderate degree 04/11/2018  . Pressure injury of skin 04/06/2018  . Altered mental status 04/04/2018  . Hypothermia 04/04/2018  . Hemodialysis graft malfunction ([place]) 03/26/2018  . Hypercholesterolemia 02/27/2018  . Diabetes ([place]) 02/27/2018  . Weakness of right lower extremity 01/20/2018  . Fever   . Periumbilical abdominal pain   . Confusion 12/22/2017  . Acute delirium 12/21/2017  . Protein-calorie malnutrition, severe 12/19/2017  . Intractable nausea and vomiting 12/18/2017  . Lymphedema 12/13/2017  . Cellulitis 11/27/2017  . Chest pain 11/19/2017  . Atherosclerosis of native arteries of the extremities with ulceration ([place]) 11/07/2017  . Twitching 01/03/2017  . Elevated troponin 10/02/2015  . Complications, dialysis, catheter, mechanical ([place]) 10/02/2015  . Musculoskeletal chest pain 09/28/2015  . Chronic diastolic CHF (congestive heart failure) ([place]) 09/28/2015  . End stage renal disease ([place]) 10/09/2012  . Hypertension 10/09/2012  . Gout 10/09/2012    Alanson Puls, PT DPT 03/05/2020, 8:59 AM  [place] MAIN [facility] SERVICES [address] [place], [place], [address] Phone: [phone]   Fax:  [phone]  Name:  Benjiman Sedgwick. MRN: [id number] Date of Birth: 1950/08/24

## 2020-03-10 ENCOUNTER — Other Ambulatory Visit: Payer: Self-pay

## 2020-03-10 ENCOUNTER — Encounter: Payer: Self-pay | Admitting: Physical Therapy

## 2020-03-10 ENCOUNTER — Ambulatory Visit: Payer: Medicare Other | Attending: Nurse Practitioner | Admitting: Physical Therapy

## 2020-03-10 DIAGNOSIS — R262 Difficulty in walking, not elsewhere classified: Secondary | ICD-10-CM | POA: Diagnosis not present

## 2020-03-10 DIAGNOSIS — R2681 Unsteadiness on feet: Secondary | ICD-10-CM | POA: Diagnosis present

## 2020-03-10 NOTE — Therapy (Signed)
[place] MAIN [facility] SERVICES [address] [place], [place], [address] Phone: [phone]   Fax:  [phone]  Physical Therapy Treatment  Patient Details  Name: Johnny [name]. MRN: [id number] Date of Birth: [birth year] Referring Provider (PT): Eulogio Ditch, NP    Encounter Date: 03/10/2020   PT End of Session - 03/10/20 0947    Visit Number 64    Number of Visits 65    Date for PT Re-Evaluation 03/19/20    Authorization Type [employer] Medicare    Authorization Time Period 10/29/19-01/21/20: FOTO by PT (assessed 7/20 score 55)    PT Start Time 0845    PT Stop Time 1000    PT Time Calculation (min) 75 min    Equipment Utilized During Treatment Gait belt    Activity Tolerance Patient tolerated treatment well;No increased pain    Behavior During Therapy WFL for tasks assessed/performed           Past Medical History:  Diagnosis Date  . Anemia   . Anxiety   . CHF (congestive heart failure) ([place])   . Chronic kidney disease    esrd dialysis m/w/f  . Gout   . Hyperlipidemia   . Hypertension   . Myocardial infarction ([place]) 2010   10 years ago  . 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  . Peripheral vascular disease [facility])     Past Surgical History:  Procedure Laterality Date  . A/V FISTULAGRAM Right 09/06/2018   Procedure: A/V FISTULAGRAM;  Surgeon: Algernon Huxley, MD;  Location: [place] CV LAB;  Service: Cardiovascular;  Laterality: Right;  . A/V SHUNTOGRAM Left 06/21/2017   Procedure: A/V SHUNTOGRAM;  Surgeon: Katha Cabal, MD;  Location: [place] CV LAB;  Service: Cardiovascular;  Laterality: Left;  . A/V SHUNTOGRAM N/A 10/24/2018   Procedure: A/V SHUNTOGRAM;  Surgeon: Algernon Huxley, MD;  Location: [place] CV LAB;  Service: Cardiovascular;  Laterality: N/A;  . ABOVE KNEE LEG AMPUTATION Right 2020  . AMPUTATION Right 10/25/2018   Procedure: AMPUTATION ABOVE KNEE;  Surgeon: Algernon Huxley, MD;  Location: [facility] ORS;  Service: General;  Laterality: Right;  . APPLICATION OF WOUND VAC Right 04/11/2018   Procedure: APPLICATION OF WOUND VAC;  Surgeon: Algernon Huxley, MD;  Location: [facility] ORS;  Service: Vascular;  Laterality: Right;  . AV FISTULA PLACEMENT Left 09/18/2015   Procedure: INSERTION OF ARTERIOVENOUS (AV) GORE-TEX GRAFT ARM ( BRACH/AXILLARY GRAFT W/ INSTANT STICK GRAFT );  Surgeon: Katha Cabal, MD;  Location: [facility] ORS;  Service: Vascular;  Laterality: Left;  . AV FISTULA PLACEMENT Right 07/19/2018   Procedure: INSERTION OF ARTERIOVENOUS (AV) GORE-TEX GRAFT ARM ( BRACHIAL AXILLARY);  Surgeon: Algernon Huxley, MD;  Location: [facility] ORS;  Service: Vascular;  Laterality: Right;  . DIALYSIS FISTULA CREATION Right 10/2017   right chest perm cath  . DIALYSIS/PERMA CATHETER REMOVAL N/A 09/13/2018   Procedure: DIALYSIS/PERMA CATHETER REMOVAL;  Surgeon: Algernon Huxley, MD;  Location: [place] CV LAB;  Service: Cardiovascular;  Laterality: N/A;  . ESOPHAGOGASTRODUODENOSCOPY N/A 12/19/2017   Procedure: ESOPHAGOGASTRODUODENOSCOPY (EGD);  Surgeon: Lin Landsman, MD;  Location: [facility] ENDOSCOPY;  Service: Gastroenterology;  Laterality: N/A;  . LOWER EXTREMITY ANGIOGRAPHY Left 11/16/2017   Procedure: LOWER EXTREMITY ANGIOGRAPHY;  Surgeon: Algernon Huxley, MD;  Location: [place] CV LAB;  Service: Cardiovascular;  Laterality: Left;  . LOWER EXTREMITY ANGIOGRAPHY Right 01/18/2018   Procedure: LOWER EXTREMITY ANGIOGRAPHY;  Surgeon:  Algernon Huxley, MD;  Location: [place] CV LAB;  Service: Cardiovascular;  Laterality: Right;  . LOWER EXTREMITY ANGIOGRAPHY Left 04/02/2018   Procedure: LOWER EXTREMITY ANGIOGRAPHY;  Surgeon: Algernon Huxley, MD;  Location: [place] CV LAB;  Service: Cardiovascular;  Laterality: Left;  . LOWER EXTREMITY ANGIOGRAPHY Right 04/09/2018   Procedure: Lower Extremity Angiography with possible intervention;  Surgeon: Algernon Huxley, MD;  Location: [place] CV LAB;   Service: Cardiovascular;  Laterality: Right;  . LOWER EXTREMITY ANGIOGRAPHY Right 07/23/2018   Procedure: Lower Extremity Angiography;  Surgeon: Algernon Huxley, MD;  Location: [place] [facility] LAB;  Service: Cardiovascular;  Laterality: Right;  . LOWER EXTREMITY ANGIOGRAPHY Right 09/13/2018   Procedure: LOWER EXTREMITY ANGIOGRAPHY;  Surgeon: Algernon Huxley, MD;  Location: [place] [facility] LAB;  Service: Cardiovascular;  Laterality: Right;  . LOWER EXTREMITY VENOGRAPHY Right 09/13/2018   Procedure: LOWER EXTREMITY VENOGRAPHY;  Surgeon: Algernon Huxley, MD;  Location: [place] CV LAB;  Service: Cardiovascular;  Laterality: Right;  . PERIPHERAL VASCULAR CATHETERIZATION Left 09/01/2015   Procedure: A/V Shuntogram/Fistulagram;  Surgeon: Katha Cabal, MD;  Location: [place] [facility] LAB;  Service: Cardiovascular;  Laterality: Left;  . PERIPHERAL VASCULAR CATHETERIZATION N/A 09/30/2015   Procedure: A/V Shuntogram/Fistulagram with perm cathether removal;  Surgeon: Algernon Huxley, MD;  Location: [place] CV LAB;  Service: Cardiovascular;  Laterality: N/A;  . PERIPHERAL VASCULAR CATHETERIZATION Left 09/30/2015   Procedure: A/V Shunt Intervention;  Surgeon: Algernon Huxley, MD;  Location: [place] [facility] LAB;  Service: Cardiovascular;  Laterality: Left;  . PERIPHERAL VASCULAR CATHETERIZATION Left 12/03/2015   Procedure: Thrombectomy;  Surgeon: Algernon Huxley, MD;  Location: [place] CV LAB;  Service: Cardiovascular;  Laterality: Left;  . PERIPHERAL VASCULAR CATHETERIZATION Left 01/28/2016   Procedure: Thrombectomy;  Surgeon: Algernon Huxley, MD;  Location: [place] CV LAB;  Service: Cardiovascular;  Laterality: Left;  . PERIPHERAL VASCULAR CATHETERIZATION N/A 01/28/2016   Procedure: A/V Shuntogram/Fistulagram;  Surgeon: Algernon Huxley, MD;  Location: [place] [facility] LAB;  Service: Cardiovascular;  Laterality: N/A;  . SKIN SPLIT GRAFT Right 05/24/2018   Procedure: SKIN GRAFT SPLIT THICKNESS ( RIGHT CALF);  Surgeon:  Algernon Huxley, MD;  Location: [facility] ORS;  Service: Vascular;  Laterality: Right;  . UPPER EXTREMITY ANGIOGRAPHY  10/24/2018   Procedure: Upper Extremity Angiography;  Surgeon: Algernon Huxley, MD;  Location: [place] [facility] LAB;  Service: Cardiovascular;;  . UPPER EXTREMITY ANGIOGRAPHY Right 02/14/2019   Procedure: UPPER EXTREMITY ANGIOGRAPHY;  Surgeon: Algernon Huxley, MD;  Location: [place] [facility];  Service: Cardiovascular;  Laterality: Right;  . WOUND DEBRIDEMENT Right 04/11/2018   Procedure: DEBRIDEMENT WOUND calf muscle and skin;  Surgeon: Algernon Huxley, MD;  Location: [facility] ORS;  Service: Vascular;  Laterality: Right;    There were no vitals filed for this visit.   Subjective Assessment - 03/10/20 0915    Subjective Pt has no complaints, doing well this session.  Denies any fall since last therapy session.  Patient reports that the prosthetist is currently fashioning a new socket.  No specific questions or concerns.    Pertinent History Pt underwent Rt AKA amputation in June 2020 after difficulty with wound healing/infection. Pt DC hospital to SNF for rehab. He has since been back at home. Pt uses a manual WC or power chair for daily mobility. Pt takes tranportation services to HD MWF. Pt was recently fitted for his AKA prosthesis in Jan 2021.  Limitations Standing;Walking;House hold activities    How long can you sit comfortably? No difficulty    How long can you stand comfortably? 1 minute    How long can you walk comfortably? unable    Currently in Pain? No/denies    Pain Score 0-No pain           Treatment:  Gait training with RW 100 feet x2with cGA and RLE having difficulty with IR difficulty with prosthesis slipping and needing new socks or socket work today at the prosthesis clinic stretching prone x 30 sec x 3 sets with prone on elbows and LLE knee flex  Transfer training sit to stand from power wc <> nu-step withCGA Transfer training from power wc <> stand with RW x 5 ,  cues for sequencing and safety 5 x sit to standwith cues for sequencing and safety Patient performed with instruction, verbal cues, tactile cues of therapist: goal:increase tissue extensibility, promote proper posture, improve mobility                           PT Education - 03/10/20 0915    Education Details HEP    Person(s) Educated Patient    Methods Explanation    Comprehension Verbalized understanding            PT Short Term Goals - 11/26/19 0956      PT SHORT TERM GOAL #1   Title Pt to demonstrate ability to perfrom STS transfer from chair c RW minGuard assist and lock his RLE knee joint without assist.    Baseline ModA from elevated surface at eval; 08/29/19: minA+1 with BUE support from regular height chair with arm rests; still requires elevated surface to perform. 7/20 able to perform STS from regular WC with RW and CGA, extended time needed to lock R knee joint and CGA for initialy steadying. highly reliant on UE    Time 4    Period Weeks    Status On-going    Target Date 12/24/19      PT SHORT TERM GOAL #2   Title Pt to demonstrate 5 degrees hip flexion 10 degrees hip abdct ROM.    Baseline lacks 5 degrees from neutral in both at eval; At visit 10, has 0 degrees flexion/extension; still lacking 5 degrees ABDCT neutral; 08/29/19: R hip: lacking 5 degrees of extension, 15 degrees abduction    Time 4    Period Weeks    Status Achieved    Target Date 06/25/19             PT Long Term Goals - 02/25/20 0944      PT LONG TERM GOAL #1   Title Pt to demonstrates 15 degrees Rt hip ABDCT, 10 degrees Rt hip extension P/ROM to facilitate prosthesis motor control.    Baseline 08/29/19: R hip: lacking 5 degrees of extension, 15 degrees abduction, 10/15/19= R hip lacking 10 deg extension; 7/20 goal deferred to next session  01/07/20+R hip lacking 10 deg extension    Time 12    Period Weeks    Status On-going    Target Date 03/19/20      PT LONG TERM  GOAL #2   Title Pt to demonstrate 5/5 strength hip extension at 0 degrees flexion/extension    Baseline has 5/5 in some ranges, but not in ranges specific to standing or gait.; 08/29/19: Unable to get to neutral flexion extension but 5/5 R hip extension in other ranges,  10/15/19= -3/5 right hip extension; 7/20 deferred to next session8/31/21=-3/5 right hip extension    Time 12    Period Weeks    Status On-going    Target Date 03/19/20      PT LONG TERM GOAL #3   Title Pt able to perfrom 5x STS from chair with RW, arms ad lib in <21sec    Baseline On 6/22 c RW and chair + 1 airex pad: 22.95sec (largely avoids use of RLE for power); 7/20, able to perform from standard WC height, 1:09seconds with BUE use.01/07/20=    Time 12    Period Weeks    Status On-going    Target Date 03/19/20      PT LONG TERM GOAL #4   Title Pt will be able to ambulate at least 100' with RW and supervision with proper locking of R prosthesis in order to demonstrate safe household ambulation in order to improve independence with gait without need for power wheelchair    Baseline Tolerating distance, but speed is inapropriate for distance for energy conservation and safety. Making progress, but needs more improvement.01/07/20= Patient has B hip flex and R knee flex during ambulation 100 feet with RLE buckling x 1 and min assisst with RW    Time 12    Period Weeks    Status On-going    Target Date 03/19/20      PT LONG TERM GOAL #5   Title Patient will continue to maintain a FOTO score of 52 or higher to indicate improvement from initial evaluation in functional activities.    Baseline initial score 48, on 7/20 score 55, 01/07/20=40.02 sec, 02/25/20=52    Time 12    Period Weeks    Status On-going    Target Date 03/19/20                 Plan - 03/10/20 0947    Clinical Impression Statement Patient performs prone stretching for LLE hip flexors and and RLE hip flexors with prone on elbows and LLE knee flex with  prone on elbows. Patient performs bed mobility training and transfer training sit to stand with BRW. Patient ambulates with CGA and VC for safety and BRW. Patient will continue to benefit from skilled PT to improve mobility and gait.    Personal Factors and Comorbidities Age;Fitness;Past/Current Experience;Education;Transportation    Examination-Activity Limitations Transfers;Dressing;Squat;Stairs;Stand;Bed Mobility    Examination-Participation Restrictions Community Activity    Rehab Potential Good    PT Frequency 2x / week    PT Treatment/Interventions ADLs/Self Care Home Management;Cryotherapy;Electrical Stimulation;DME Instruction;Gait training;Stair training;Functional mobility training;Therapeutic activities;Therapeutic exercise;Balance training;Neuromuscular re-education;Cognitive remediation;Patient/family education;Prosthetic Training;Wheelchair mobility training;Passive range of motion;Dry needling;Scar mobilization;Spinal Manipulations;Joint Manipulations    Consulted and Agree with Plan of Care Patient           Patient will benefit from skilled therapeutic intervention in order to improve the following deficits and impairments:  Decreased activity tolerance, Decreased balance, Decreased mobility, Decreased strength, Decreased cognition, Decreased knowledge of use of DME, Decreased knowledge of precautions, Decreased endurance, Decreased range of motion, Difficulty walking, Decreased safety awareness, Decreased skin integrity, Decreased scar mobility, Hypomobility, Postural dysfunction, Increased edema, Increased muscle spasms, Increased fascial restricitons, Prosthetic Dependency, Impaired tone, Improper body mechanics  Visit Diagnosis: Difficulty in walking, not elsewhere classified  Unsteadiness on feet     Problem List Patient Active Problem List   Diagnosis Date Noted  . Cardiomyopathy ([place]) 01/22/2019  . CHF (congestive heart failure) ([place]) 01/22/2019  . Coronary disease  01/22/2019  . Above knee amputation of right lower extremity ([place]) 11/13/2018  . Leg ulcer, right, with fat layer exposed ([place]) 10/22/2018  . Cellulitis of right leg 07/21/2018  . Lower limb ulcer, calf, right, limited to breakdown of skin ([place]) 06/08/2018  . PVD (peripheral vascular disease) ([place]) 04/17/2018  . Malnutrition of moderate degree 04/11/2018  . Pressure injury of skin 04/06/2018  . Altered mental status 04/04/2018  . Hypothermia 04/04/2018  . Hemodialysis graft malfunction ([place]) 03/26/2018  . Hypercholesterolemia 02/27/2018  . Diabetes ([place]) 02/27/2018  . Weakness of right lower extremity 01/20/2018  . Fever   . Periumbilical abdominal pain   . Confusion 12/22/2017  . Acute delirium 12/21/2017  . Protein-calorie malnutrition, severe 12/19/2017  . Intractable nausea and vomiting 12/18/2017  . Lymphedema 12/13/2017  . Cellulitis 11/27/2017  . Chest pain 11/19/2017  . Atherosclerosis of native arteries of the extremities with ulceration ([place]) 11/07/2017  . Twitching 01/03/2017  . Elevated troponin 10/02/2015  . Complications, dialysis, catheter, mechanical ([place]) 10/02/2015  . Musculoskeletal chest pain 09/28/2015  . Chronic diastolic CHF (congestive heart failure) ([place]) 09/28/2015  . End stage renal disease ([place]) 10/09/2012  . Hypertension 10/09/2012  . Gout 10/09/2012    Alanson Puls, PT DPT 03/10/2020, 9:49 AM  [place] MAIN [facility] SERVICES [address] [place], [place], [address] Phone: [phone]   Fax:  [phone]  Name: Johnny [name]. MRN: [id number] Date of Birth: 1950-09-28

## 2020-03-10 NOTE — Therapy (Signed)
[place] MAIN [facility] SERVICES [address] [place], [place], [address] Phone: [phone]   Fax:  [phone]  Physical Therapy Treatment  Patient Details  Name: Johnny [name]. MRN: [id number] Date of Birth: June 08, 1950 Referring Provider (PT): Eulogio Ditch, NP    Encounter Date: 03/03/2020    Past Medical History:  Diagnosis Date  . Anemia   . Anxiety   . CHF (congestive heart failure) ([place])   . Chronic kidney disease    esrd dialysis m/w/f  . Gout   . Hyperlipidemia   . Hypertension   . Myocardial infarction ([place]) 2010   10 years ago  . 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  . Peripheral vascular disease [facility])     Past Surgical History:  Procedure Laterality Date  . A/V FISTULAGRAM Right 09/06/2018   Procedure: A/V FISTULAGRAM;  Surgeon: Algernon Huxley, MD;  Location: [place] [facility];  Service: Cardiovascular;  Laterality: Right;  . A/V SHUNTOGRAM Left 06/21/2017   Procedure: A/V SHUNTOGRAM;  Surgeon: Katha Cabal, MD;  Location: [place] [facility];  Service: Cardiovascular;  Laterality: Left;  . A/V SHUNTOGRAM N/A 10/24/2018   Procedure: A/V SHUNTOGRAM;  Surgeon: Algernon Huxley, MD;  Location: [place] [facility];  Service: Cardiovascular;  Laterality: N/A;  . ABOVE KNEE LEG AMPUTATION Right 2020  . AMPUTATION Right 10/25/2018   Procedure: AMPUTATION ABOVE KNEE;  Surgeon: Algernon Huxley, MD;  Location: [facility] ORS;  Service: General;  Laterality: Right;  . APPLICATION OF WOUND VAC Right 04/11/2018   Procedure: APPLICATION OF WOUND VAC;  Surgeon: Algernon Huxley, MD;  Location: [facility] OR[facility];  Service: Vascular;  Laterality: Right;  . AV FISTULA PLACEMENT Left 09/18/2015   Procedure: INSERTION OF ARTERIOVENOUS (AV) GORE-TEX GRAFT ARM ( BRACH/AXILLARY GRAFT W/ INSTANT STICK GRAFT );  Surgeon: Katha Cabal, MD;  Location: [facility] OR[facility];  Service: Vascular;  Laterality: Left;  . AV FISTULA PLACEMENT  Right 07/19/2018   Procedure: INSERTION OF ARTERIOVENOUS (AV) GORE-TEX GRAFT ARM ( BRACHIAL AXILLARY);  Surgeon: Algernon Huxley, MD;  Location: [facility] OR[facility];  Service: Vascular;  Laterality: Right;  . DIALYSIS FISTULA CREATION Right 10/2017   right chest perm cath  . DIALYSIS/PERMA CATHETER REMOVAL N/A 09/13/2018   Procedure: DIALYSIS/PERMA CATHETER REMOVAL;  Surgeon: Algernon Huxley, MD;  Location: [place] [facility];  Service: Cardiovascular;  Laterality: N/A;  . ESOPHAGOGASTRODUODENOSCOPY N/A 12/19/2017   Procedure: ESOPHAGOGASTRODUODENOSCOPY (EGD);  Surgeon: Lin Landsman, MD;  Location: [facility]OS[facility];  Service: Gastroenterology;  Laterality: N/A;  . LOWER EXTREMITY ANGIOGRAPHY Left 11/16/2017   Procedure: LOWER EXTREMITY ANGIOGRAPHY;  Surgeon: Algernon Huxley, MD;  Location: [place] [facility] LAB;  Service: Cardiovascular;  Laterality: Left;  . LOWER EXTREMITY ANGIOGRAPHY Right 01/18/2018   Procedure: LOWER EXTREMITY ANGIOGRAPHY;  Surgeon: Algernon Huxley, MD;  Location: [place] [facility];  Service: Cardiovascular;  Laterality: Right;  . LOWER EXTREMITY ANGIOGRAPHY Left 04/02/2018   Procedure: LOWER EXTREMITY ANGIOGRAPHY;  Surgeon: Algernon Huxley, MD;  Location: [place] [facility];  Service: Cardiovascular;  Laterality: Left;  . LOWER EXTREMITY ANGIOGRAPHY Right 04/09/2018   Procedure: Lower Extremity Angiography with possible intervention;  Surgeon: Algernon Huxley, MD;  Location: [place] [facility];  Service: Cardiovascular;  Laterality: Right;  . LOWER EXTREMITY ANGIOGRAPHY Right 07/23/2018   Procedure: Lower Extremity Angiography;  Surgeon: Algernon Huxley, MD;  Location: [place] [facility];  Service: Cardiovascular;  Laterality:  Right;  [name] LOWER EXTREMITY ANGIOGRAPHY Right 09/13/2018   Procedure: LOWER EXTREMITY ANGIOGRAPHY;  Surgeon: Algernon Huxley, MD;  Location: [place] [facility];  Service: Cardiovascular;  Laterality: Right;  . LOWER EXTREMITY VENOGRAPHY Right 09/13/2018   Procedure: LOWER EXTREMITY  VENOGRAPHY;  Surgeon: Algernon Huxley, MD;  Location: [place] [facility];  Service: Cardiovascular;  Laterality: Right;  . PERIPHERAL VASCULAR CATHETERIZATION Left 09/01/2015   Procedure: A/V Shuntogram/Fistulagram;  Surgeon: Katha Cabal, MD;  Location: [place] [facility];  Service: Cardiovascular;  Laterality: Left;  . PERIPHERAL VASCULAR CATHETERIZATION N/A 09/30/2015   Procedure: A/V Shuntogram/Fistulagram with perm cathether removal;  Surgeon: Algernon Huxley, MD;  Location: [place] [facility];  Service: Cardiovascular;  Laterality: N/A;  . PERIPHERAL VASCULAR CATHETERIZATION Left 09/30/2015   Procedure: A/V Shunt Intervention;  Surgeon: Algernon Huxley, MD;  Location: [place] [facility];  Service: Cardiovascular;  Laterality: Left;  . PERIPHERAL VASCULAR CATHETERIZATION Left 12/03/2015   Procedure: Thrombectomy;  Surgeon: Algernon Huxley, MD;  Location: [place] [facility];  Service: Cardiovascular;  Laterality: Left;  . PERIPHERAL VASCULAR CATHETERIZATION Left 01/28/2016   Procedure: Thrombectomy;  Surgeon: Algernon Huxley, MD;  Location: [place] [facility];  Service: Cardiovascular;  Laterality: Left;  . PERIPHERAL VASCULAR CATHETERIZATION N/A 01/28/2016   Procedure: A/V Shuntogram/Fistulagram;  Surgeon: Algernon Huxley, MD;  Location: [place] [facility];  Service: Cardiovascular;  Laterality: N/A;  . SKIN SPLIT GRAFT Right 05/24/2018   Procedure: SKIN GRAFT SPLIT THICKNESS ( RIGHT CALF);  Surgeon: Algernon Huxley, MD;  Location: [facility];  Service: Vascular;  Laterality: Right;  . UPPER EXTREMITY ANGIOGRAPHY  10/24/2018   Procedure: Upper Extremity Angiography;  Surgeon: Algernon Huxley, MD;  Location: [place] [facility];  Service: Cardiovascular;;  . UPPER EXTREMITY ANGIOGRAPHY Right 02/14/2019   Procedure: UPPER EXTREMITY ANGIOGRAPHY;  Surgeon: Algernon Huxley, MD;  Location: [place] [facility];  Service: Cardiovascular;  Laterality: Right;  . WOUND DEBRIDEMENT Right 04/11/2018   Procedure: DEBRIDEMENT  WOUND calf muscle and skin;  Surgeon: Algernon Huxley, MD;  Location: [facility] OR[facility];  Service: Vascular;  Laterality: Right;    There were no vitals filed for this visit.                                PT Short Term Goals - 11/26/19 0956      PT SHORT TERM GOAL #1   Title Pt to demonstrate ability to perfrom STS transfer from chair c RW minGuard assist and lock his RLE knee joint without assist.    Baseline ModA from elevated surface at eval; 08/29/19: minA+1 with BUE support from regular height chair with arm rests; still requires elevated surface to perform. 7/20 able to perform STS from regular WC with RW and CGA, extended time needed to lock R knee joint and CGA for initialy steadying. highly reliant on UE    Time 4    Period Weeks    Status On-going    Target Date 12/24/19      PT SHORT TERM GOAL #2   Title Pt to demonstrate 5 degrees hip flexion 10 degrees hip abdct ROM.    Baseline lacks 5 degrees from neutral in both at eval; At visit 10, has 0 degrees flexion/extension; still lacking 5 degrees ABDCT neutral; 08/29/19: R hip: lacking 5 degrees of extension, 15 degrees abduction    Time 4  Period Weeks    Status Achieved    Target Date 06/25/19             PT Long Term Goals - 02/25/20 0944      PT LONG TERM GOAL #1   Title Pt to demonstrates 15 degrees Rt hip ABDCT, 10 degrees Rt hip extension P/ROM to facilitate prosthesis motor control.    Baseline 08/29/19: R hip: lacking 5 degrees of extension, 15 degrees abduction, 10/15/19= R hip lacking 10 deg extension; 7/20 goal deferred to next session  01/07/20+R hip lacking 10 deg extension    Time 12    Period Weeks    Status On-going    Target Date 03/19/20      PT LONG TERM GOAL #2   Title Pt to demonstrate 5/5 strength hip extension at 0 degrees flexion/extension    Baseline has 5/5 in some ranges, but not in ranges specific to standing or gait.; 08/29/19: Unable to get to neutral flexion extension but  5/5 R hip extension in other ranges, 10/15/19= -3/5 right hip extension; 7/20 deferred to next session8/31/21=-3/5 right hip extension    Time 12    Period Weeks    Status On-going    Target Date 03/19/20      PT LONG TERM GOAL #3   Title Pt able to perfrom 5x STS from chair with RW, arms ad lib in <21sec    Baseline On 6/22 c RW and chair + 1 airex pad: 22.95sec (largely avoids use of RLE for power); 7/20, able to perform from standard WC height, 1:09seconds with BUE use.01/07/20=    Time 12    Period Weeks    Status On-going    Target Date 03/19/20      PT LONG TERM GOAL #4   Title Pt will be able to ambulate at least 100' with RW and supervision with proper locking of R prosthesis in order to demonstrate safe household ambulation in order to improve independence with gait without need for power wheelchair    Baseline Tolerating distance, but speed is inapropriate for distance for energy conservation and safety. Making progress, but needs more improvement.01/07/20= Patient has B hip flex and R knee flex during ambulation 100 feet with RLE buckling x 1 and min assisst with RW    Time 12    Period Weeks    Status On-going    Target Date 03/19/20      PT LONG TERM GOAL #5   Title Patient will continue to maintain a FOTO score of 52 or higher to indicate improvement from initial evaluation in functional activities.    Baseline initial score 48, on 7/20 score 55, 01/07/20=40.02 sec, 02/25/20=52    Time 12    Period Weeks    Status On-going    Target Date 03/19/20                  Patient will benefit from skilled therapeutic intervention in order to improve the following deficits and impairments:  Decreased activity tolerance, Decreased balance, Decreased mobility, Decreased strength, Decreased cognition, Decreased knowledge of use of DME, Decreased knowledge of precautions, Decreased endurance, Decreased range of motion, Difficulty walking, Decreased safety awareness, Decreased skin  integrity, Decreased scar mobility, Hypomobility, Postural dysfunction, Increased edema, Increased muscle spasms, Increased fascial restricitons, Prosthetic Dependency, Impaired tone, Improper body mechanics  Visit Diagnosis: Difficulty in walking, not elsewhere classified  Unsteadiness on feet     Problem List Patient Active Problem List   Diagnosis  Date Noted  . Cardiomyopathy ([place]) 01/22/2019  . CHF (congestive heart failure) ([place]) 01/22/2019  . Coronary disease 01/22/2019  . Above knee amputation of right lower extremity ([place]) 11/13/2018  . Leg ulcer, right, with fat layer exposed ([place]) 10/22/2018  . Cellulitis of right leg 07/21/2018  . Lower limb ulcer, calf, right, limited to breakdown of skin ([place]) 06/08/2018  . PVD (peripheral vascular disease) ([place]) 04/17/2018  . Malnutrition of moderate degree 04/11/2018  . Pressure injury of skin 04/06/2018  . Altered mental status 04/04/2018  . Hypothermia 04/04/2018  . Hemodialysis graft malfunction ([place]) 03/26/2018  . Hypercholesterolemia 02/27/2018  . Diabetes ([place]) 02/27/2018  . Weakness of right lower extremity 01/20/2018  . Fever   . Periumbilical abdominal pain   . Confusion 12/22/2017  . Acute delirium 12/21/2017  . Protein-calorie malnutrition, severe 12/19/2017  . Intractable nausea and vomiting 12/18/2017  . Lymphedema 12/13/2017  . Cellulitis 11/27/2017  . Chest pain 11/19/2017  . Atherosclerosis of native arteries of the extremities with ulceration ([place]) 11/07/2017  . Twitching 01/03/2017  . Elevated troponin 10/02/2015  . Complications, dialysis, catheter, mechanical ([place]) 10/02/2015  . Musculoskeletal chest pain 09/28/2015  . Chronic diastolic CHF (congestive heart failure) ([place]) 09/28/2015  . End stage renal disease ([place]) 10/09/2012  . Hypertension 10/09/2012  . Gout 10/09/2012    Johnny [name] 03/10/2020, 10:19 AM  [place] MAIN [facility] SERVICES [address] [place], [place], [address] Phone: [phone]   Fax:  [phone]  Name: Johnny [name]. MRN: [id number] Date of Birth: November 22, 1950

## 2020-03-12 ENCOUNTER — Encounter: Payer: Self-pay | Admitting: Physical Therapy

## 2020-03-12 ENCOUNTER — Ambulatory Visit: Payer: Medicare Other | Admitting: Physical Therapy

## 2020-03-12 ENCOUNTER — Other Ambulatory Visit: Payer: Self-pay

## 2020-03-12 DIAGNOSIS — R262 Difficulty in walking, not elsewhere classified: Secondary | ICD-10-CM | POA: Diagnosis not present

## 2020-03-12 DIAGNOSIS — R2681 Unsteadiness on feet: Secondary | ICD-10-CM

## 2020-03-12 NOTE — Therapy (Signed)
[place] MAIN [facility] SERVICES [address] [place], [place], [address] Phone: [phone]   Fax:  [phone]  Physical Therapy Treatment  Patient Details  Name: Johnny [name]. MRN: [id number] Date of Birth: 18-Mar-1951 Referring Provider (PT): Eulogio Ditch, NP    Encounter Date: 03/12/2020   PT End of Session - 03/12/20 0930    Visit Number 65    Number of Visits 65    Date for PT Re-Evaluation 03/19/20    Authorization Type [employer] Medicare    Authorization Time Period 10/29/19-01/21/20: FOTO by PT (assessed 7/20 score 55)    PT Start Time 0845    PT Stop Time 0925    PT Time Calculation (min) 40 min    Equipment Utilized During Treatment Gait belt    Activity Tolerance Patient tolerated treatment well;No increased pain    Behavior During Therapy WFL for tasks assessed/performed           Past Medical History:  Diagnosis Date  . Anemia   . Anxiety   . CHF (congestive heart failure) ([place])   . Chronic kidney disease    esrd dialysis m/w/f  . Gout   . Hyperlipidemia   . Hypertension   . Myocardial infarction ([place]) 2010   10 years ago  . 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  . Peripheral vascular disease [facility])     Past Surgical History:  Procedure Laterality Date  . A/V FISTULAGRAM Right 09/06/2018   Procedure: A/V FISTULAGRAM;  Surgeon: Algernon Huxley, MD;  Location: [place] CV LAB;  Service: Cardiovascular;  Laterality: Right;  . A/V SHUNTOGRAM Left 06/21/2017   Procedure: A/V SHUNTOGRAM;  Surgeon: Katha Cabal, MD;  Location: [place] [facility];  Service: Cardiovascular;  Laterality: Left;  . A/V SHUNTOGRAM N/A 10/24/2018   Procedure: A/V SHUNTOGRAM;  Surgeon: Algernon Huxley, MD;  Location: [place] [facility];  Service: Cardiovascular;  Laterality: N/A;  . ABOVE KNEE LEG AMPUTATION Right 2020  . AMPUTATION Right 10/25/2018   Procedure: AMPUTATION ABOVE KNEE;  Surgeon: Algernon Huxley, MD;  Location: [facility] ORS;  Service: General;  Laterality: Right;  . APPLICATION OF WOUND VAC Right 04/11/2018   Procedure: APPLICATION OF WOUND VAC;  Surgeon: Algernon Huxley, MD;  Location: [facility] ORS;  Service: Vascular;  Laterality: Right;  . AV FISTULA PLACEMENT Left 09/18/2015   Procedure: INSERTION OF ARTERIOVENOUS (AV) GORE-TEX GRAFT ARM ( BRACH/AXILLARY GRAFT W/ INSTANT STICK GRAFT );  Surgeon: Katha Cabal, MD;  Location: [facility] ORS;  Service: Vascular;  Laterality: Left;  . AV FISTULA PLACEMENT Right 07/19/2018   Procedure: INSERTION OF ARTERIOVENOUS (AV) GORE-TEX GRAFT ARM ( BRACHIAL AXILLARY);  Surgeon: Algernon Huxley, MD;  Location: [facility] ORS;  Service: Vascular;  Laterality: Right;  . DIALYSIS FISTULA CREATION Right 10/2017   right chest perm cath  . DIALYSIS/PERMA CATHETER REMOVAL N/A 09/13/2018   Procedure: DIALYSIS/PERMA CATHETER REMOVAL;  Surgeon: Algernon Huxley, MD;  Location: [place] [facility] LAB;  Service: Cardiovascular;  Laterality: N/A;  . ESOPHAGOGASTRODUODENOSCOPY N/A 12/19/2017   Procedure: ESOPHAGOGASTRODUODENOSCOPY (EGD);  Surgeon: Lin Landsman, MD;  Location[facility] ENDOSCOPY;  Service: Gastroenterology;  Laterality: N/A;  . LOWER EXTREMITY ANGIOGRAPHY Left 11/16/2017   Procedure: LOWER EXTREMITY ANGIOGRAPHY;  Surgeon: Algernon Huxley, MD;  Location: [place] [facility];  Service: Cardiovascular;  Laterality: Left;  . LOWER EXTREMITY ANGIOGRAPHY Right 01/18/2018   Procedure: LOWER EXTREMITY ANGIOGRAPHY;  Surgeon:  Algernon Huxley, MD;  Location: [place] [facility];  Service: Cardiovascular;  Laterality: Right;  . LOWER EXTREMITY ANGIOGRAPHY Left 04/02/2018   Procedure: LOWER EXTREMITY ANGIOGRAPHY;  Surgeon: Algernon Huxley, MD;  Location: [place] [facility];  Service: Cardiovascular;  Laterality: Left;  . LOWER EXTREMITY ANGIOGRAPHY Right 04/09/2018   Procedure: Lower Extremity Angiography with possible intervention;  Surgeon: Algernon Huxley, MD;  Location: [place] [facility];   Service: Cardiovascular;  Laterality: Right;  . LOWER EXTREMITY ANGIOGRAPHY Right 07/23/2018   Procedure: Lower Extremity Angiography;  Surgeon: Algernon Huxley, MD;  Location: [place] [facility];  Service: Cardiovascular;  Laterality: Right;  . LOWER EXTREMITY ANGIOGRAPHY Right 09/13/2018   Procedure: LOWER EXTREMITY ANGIOGRAPHY;  Surgeon: Algernon Huxley, MD;  Location: [place] [facility];  Service: Cardiovascular;  Laterality: Right;  . LOWER EXTREMITY VENOGRAPHY Right 09/13/2018   Procedure: LOWER EXTREMITY VENOGRAPHY;  Surgeon: Algernon Huxley, MD;  Location: [place] [facility];  Service: Cardiovascular;  Laterality: Right;  . PERIPHERAL VASCULAR CATHETERIZATION Left 09/01/2015   Procedure: A/V Shuntogram/Fistulagram;  Surgeon: Katha Cabal, MD;  Location: [place] [facility];  Service: Cardiovascular;  Laterality: Left;  . PERIPHERAL VASCULAR CATHETERIZATION N/A 09/30/2015   Procedure: A/V Shuntogram/Fistulagram with perm cathether removal;  Surgeon: Algernon Huxley, MD;  Location: [place] [facility];  Service: Cardiovascular;  Laterality: N/A;  . PERIPHERAL VASCULAR CATHETERIZATION Left 09/30/2015   Procedure: A/V Shunt Intervention;  Surgeon: Algernon Huxley, MD;  Location: [place] [facility];  Service: Cardiovascular;  Laterality: Left;  . PERIPHERAL VASCULAR CATHETERIZATION Left 12/03/2015   Procedure: Thrombectomy;  Surgeon: Algernon Huxley, MD;  Location: [place] [facility];  Service: Cardiovascular;  Laterality: Left;  . PERIPHERAL VASCULAR CATHETERIZATION Left 01/28/2016   Procedure: Thrombectomy;  Surgeon: Algernon Huxley, MD;  Location: [place] [facility];  Service: Cardiovascular;  Laterality: Left;  . PERIPHERAL VASCULAR CATHETERIZATION N/A 01/28/2016   Procedure: A/V Shuntogram/Fistulagram;  Surgeon: Algernon Huxley, MD;  Location: [place] [facility];  Service: Cardiovascular;  Laterality: N/A;  . SKIN SPLIT GRAFT Right 05/24/2018   Procedure: SKIN GRAFT SPLIT THICKNESS ( RIGHT CALF);  Surgeon:  Algernon Huxley, MD;  Location: [facility] ORS;  Service: Vascular;  Laterality: Right;  . UPPER EXTREMITY ANGIOGRAPHY  10/24/2018   Procedure: Upper Extremity Angiography;  Surgeon: Algernon Huxley, MD;  Location: [place] [facility];  Service: Cardiovascular;;  . UPPER EXTREMITY ANGIOGRAPHY Right 02/14/2019   Procedure: UPPER EXTREMITY ANGIOGRAPHY;  Surgeon: Algernon Huxley, MD;  Location: [place] [facility];  Service: Cardiovascular;  Laterality: Right;  . WOUND DEBRIDEMENT Right 04/11/2018   Procedure: DEBRIDEMENT WOUND calf muscle and skin;  Surgeon: Algernon Huxley, MD;  Location: [facility] ORS;  Service: Vascular;  Laterality: Right;    There were no vitals filed for this visit.   Subjective Assessment - 03/12/20 0930    Subjective Pt has no complaints, doing well this session.  Denies any fall since last therapy session.  Patient reports that the prosthetist is currently fashioning a new socket.  No specific questions or concerns.    Pertinent History Pt underwent Rt AKA amputation in June 2020 after difficulty with wound healing/infection. Pt DC hospital to SNF for rehab. He has since been back at home. Pt uses a manual WC or power chair for daily mobility. Pt takes tranportation services to HD MWF. Pt was recently fitted for his AKA prosthesis in Jan 2021.  Limitations Standing;Walking;House hold activities    How long can you sit comfortably? No difficulty    How long can you stand comfortably? 1 minute    How long can you walk comfortably? unable    Currently in Pain? No/denies    Pain Score 0-No pain           Treatment:  Gait training with RW 100 feet x2with cGA and RLE having difficulty with IR difficulty with prosthesis slipping and needing new socks or socket work today at the prosthesis clinic  Transfer training sit to stand from power wc <> nu-step withCGA Transfer training from power wc <> stand with RW x 5 , cues for sequencing and safety 5 x sit to standwith cues for sequencing  and safety Patient performed with instruction, verbal cues, tactile cues of therapist: goal:increase tissue extensibility, promote proper posture, improve mobility                          PT Education - 03/12/20 0930    Education Details HEP    Person(s) Educated Patient    Methods Explanation    Comprehension Verbalized understanding            PT Short Term Goals - 11/26/19 0956      PT SHORT TERM GOAL #1   Title Pt to demonstrate ability to perfrom STS transfer from chair c RW minGuard assist and lock his RLE knee joint without assist.    Baseline ModA from elevated surface at eval; 08/29/19: minA+1 with BUE support from regular height chair with arm rests; still requires elevated surface to perform. 7/20 able to perform STS from regular WC with RW and CGA, extended time needed to lock R knee joint and CGA for initialy steadying. highly reliant on UE    Time 4    Period Weeks    Status On-going    Target Date 12/24/19      PT SHORT TERM GOAL #2   Title Pt to demonstrate 5 degrees hip flexion 10 degrees hip abdct ROM.    Baseline lacks 5 degrees from neutral in both at eval; At visit 10, has 0 degrees flexion/extension; still lacking 5 degrees ABDCT neutral; 08/29/19: R hip: lacking 5 degrees of extension, 15 degrees abduction    Time 4    Period Weeks    Status Achieved    Target Date 06/25/19             PT Long Term Goals - 02/25/20 0944      PT LONG TERM GOAL #1   Title Pt to demonstrates 15 degrees Rt hip ABDCT, 10 degrees Rt hip extension P/ROM to facilitate prosthesis motor control.    Baseline 08/29/19: R hip: lacking 5 degrees of extension, 15 degrees abduction, 10/15/19= R hip lacking 10 deg extension; 7/20 goal deferred to next session  01/07/20+R hip lacking 10 deg extension    Time 12    Period Weeks    Status On-going    Target Date 03/19/20      PT LONG TERM GOAL #2   Title Pt to demonstrate 5/5 strength hip extension at 0 degrees  flexion/extension    Baseline has 5/5 in some ranges, but not in ranges specific to standing or gait.; 08/29/19: Unable to get to neutral flexion extension but 5/5 R hip extension in other ranges, 10/15/19= -3/5 right hip extension; 7/20 deferred to next session8/31/21=-3/5 right hip extension    Time  12    Period Weeks    Status On-going    Target Date 03/19/20      PT LONG TERM GOAL #3   Title Pt able to perfrom 5x STS from chair with RW, arms ad lib in <21sec    Baseline On 6/22 c RW and chair + 1 airex pad: 22.95sec (largely avoids use of RLE for power); 7/20, able to perform from standard WC height, 1:09seconds with BUE use.01/07/20=    Time 12    Period Weeks    Status On-going    Target Date 03/19/20      PT LONG TERM GOAL #4   Title Pt will be able to ambulate at least 100' with RW and supervision with proper locking of R prosthesis in order to demonstrate safe household ambulation in order to improve independence with gait without need for power wheelchair    Baseline Tolerating distance, but speed is inapropriate for distance for energy conservation and safety. Making progress, but needs more improvement.01/07/20= Patient has B hip flex and R knee flex during ambulation 100 feet with RLE buckling x 1 and min assisst with RW    Time 12    Period Weeks    Status On-going    Target Date 03/19/20      PT LONG TERM GOAL #5   Title Patient will continue to maintain a FOTO score of 52 or higher to indicate improvement from initial evaluation in functional activities.    Baseline initial score 48, on 7/20 score 55, 01/07/20=40.02 sec, 02/25/20=52    Time 12    Period Weeks    Status On-going    Target Date 03/19/20                 Plan - 03/12/20 0935    Clinical Impression Statement Patient performs gait training with BRW and transfer training with VC for safety and sequencing. Patient needs cues to get closer to surface he is transfering to. He performs exercises and stretches  for BLE. Patient will conitnue to benefit from skilled PT to improve mobility and safety.    Personal Factors and Comorbidities Age;Fitness;Past/Current Experience;Education;Transportation    Examination-Activity Limitations Transfers;Dressing;Squat;Stairs;Stand;Bed Mobility    Examination-Participation Restrictions Community Activity    Stability/Clinical Decision Making Stable/Uncomplicated    Rehab Potential Good    PT Frequency 2x / week    PT Duration 12 weeks    PT Treatment/Interventions ADLs/Self Care Home Management;Cryotherapy;Electrical Stimulation;DME Instruction;Gait training;Stair training;Functional mobility training;Therapeutic activities;Therapeutic exercise;Balance training;Neuromuscular re-education;Cognitive remediation;Patient/family education;Prosthetic Training;Wheelchair mobility training;Passive range of motion;Dry needling;Scar mobilization;Spinal Manipulations;Joint Manipulations           Patient will benefit from skilled therapeutic intervention in order to improve the following deficits and impairments:  Decreased activity tolerance, Decreased balance, Decreased mobility, Decreased strength, Decreased cognition, Decreased knowledge of use of DME, Decreased knowledge of precautions, Decreased endurance, Decreased range of motion, Difficulty walking, Decreased safety awareness, Decreased skin integrity, Decreased scar mobility, Hypomobility, Postural dysfunction, Increased edema, Increased muscle spasms, Increased fascial restricitons, Prosthetic Dependency, Impaired tone, Improper body mechanics  Visit Diagnosis: Difficulty in walking, not elsewhere classified  Unsteadiness on feet     Problem List Patient Active Problem List   Diagnosis Date Noted  . Cardiomyopathy ([place]) 01/22/2019  . CHF (congestive heart failure) ([place]) 01/22/2019  . Coronary disease 01/22/2019  . Above knee amputation of right lower extremity ([place]) 11/13/2018  . Leg ulcer, right, with  fat layer exposed ([place]) 10/22/2018  . Cellulitis of right  leg 07/21/2018  . Lower limb ulcer, calf, right, limited to breakdown of skin ([place]) 06/08/2018  . PVD (peripheral vascular disease) ([place]) 04/17/2018  . Malnutrition of moderate degree 04/11/2018  . Pressure injury of skin 04/06/2018  . Altered mental status 04/04/2018  . Hypothermia 04/04/2018  . Hemodialysis graft malfunction ([place]) 03/26/2018  . Hypercholesterolemia 02/27/2018  . Diabetes ([place]) 02/27/2018  . Weakness of right lower extremity 01/20/2018  . Fever   . Periumbilical abdominal pain   . Confusion 12/22/2017  . Acute delirium 12/21/2017  . Protein-calorie malnutrition, severe 12/19/2017  . Intractable nausea and vomiting 12/18/2017  . Lymphedema 12/13/2017  . Cellulitis 11/27/2017  . Chest pain 11/19/2017  . Atherosclerosis of native arteries of the extremities with ulceration ([place]) 11/07/2017  . Twitching 01/03/2017  . Elevated troponin 10/02/2015  . Complications, dialysis, catheter, mechanical ([place]) 10/02/2015  . Musculoskeletal chest pain 09/28/2015  . Chronic diastolic CHF (congestive heart failure) ([place]) 09/28/2015  . End stage renal disease ([place]) 10/09/2012  . Hypertension 10/09/2012  . Gout 10/09/2012    Alanson Puls, PT DPT 03/12/2020, 9:39 AM  [place] MAIN [facility] SERVICES [address] [place], [place], [address] Phone: [phone]   Fax:  [phone]  Name: Sanders Manninen. MRN: [id number] Date of Birth: [birth year]

## 2020-03-17 ENCOUNTER — Other Ambulatory Visit: Payer: Self-pay

## 2020-03-17 ENCOUNTER — Encounter: Payer: Self-pay | Admitting: Physical Therapy

## 2020-03-17 ENCOUNTER — Ambulatory Visit: Payer: Medicare Other | Admitting: Physical Therapy

## 2020-03-17 DIAGNOSIS — R262 Difficulty in walking, not elsewhere classified: Secondary | ICD-10-CM | POA: Diagnosis not present

## 2020-03-17 DIAGNOSIS — R2681 Unsteadiness on feet: Secondary | ICD-10-CM

## 2020-03-17 NOTE — Therapy (Signed)
[place] MAIN [facility] [address] [place], [place], [address] Phone: [phone]   Fax:  [phone]  Physical Therapy Treatment  Patient Details  Name: Johnny [name]. MRN: [id number] Date of Birth: 15-Jun-1950 Referring Provider (PT): Eulogio Ditch, NP    Encounter Date: 03/17/2020   PT End of Session - 03/17/20 0954    Visit Number 65    Number of Visits 81    Date for PT Re-Evaluation 06/11/20    PT Start Time 0845    PT Stop Time 0925    PT Time Calculation (min) 40 min    Equipment Utilized During Treatment Gait belt    Activity Tolerance Patient tolerated treatment well;No increased pain    Behavior During Therapy WFL for tasks assessed/performed           Past Medical History:  Diagnosis Date  . Anemia   . Anxiety   . CHF (congestive heart failure) ([place])   . Chronic kidney disease    esrd dialysis m/w/f  . Gout   . Hyperlipidemia   . Hypertension   . Myocardial infarction ([place]) 2010   10 years ago  . 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  . Peripheral vascular disease [facility])     Past Surgical History:  Procedure Laterality Date  . A/V FISTULAGRAM Right 09/06/2018   Procedure: A/V FISTULAGRAM;  Surgeon: Algernon Huxley, MD;  Location: [place] [facility];  Service: Cardiovascular;  Laterality: Right;  . A/V SHUNTOGRAM Left 06/21/2017   Procedure: A/V SHUNTOGRAM;  Surgeon: Katha Cabal, MD;  Location: [place] [facility];  Service: Cardiovascular;  Laterality: Left;  . A/V SHUNTOGRAM N/A 10/24/2018   Procedure: A/V SHUNTOGRAM;  Surgeon: Algernon Huxley, MD;  Location: [place] [facility];  Service: Cardiovascular;  Laterality: N/A;  . ABOVE KNEE LEG AMPUTATION Right 2020  . AMPUTATION Right 10/25/2018   Procedure: AMPUTATION ABOVE KNEE;  Surgeon: Algernon Huxley, MD;  Location: [facility];  Service: General;  Laterality: Right;  . APPLICATION OF WOUND VAC Right 04/11/2018    Procedure: APPLICATION OF WOUND VAC;  Surgeon: Algernon Huxley, MD;  Location: [facility];  Service: Vascular;  Laterality: Right;  . AV FISTULA PLACEMENT Left 09/18/2015   Procedure: INSERTION OF ARTERIOVENOUS (AV) GORE-TEX GRAFT ARM ( BRACH/AXILLARY GRAFT W/ INSTANT STICK GRAFT );  Surgeon: Katha Cabal, MD;  Location: [facility];  Service: Vascular;  Laterality: Left;  . AV FISTULA PLACEMENT Right 07/19/2018   Procedure: INSERTION OF ARTERIOVENOUS (AV) GORE-TEX GRAFT ARM ( BRACHIAL AXILLARY);  Surgeon: Algernon Huxley, MD;  Location: [facility];  Service: Vascular;  Laterality: Right;  . DIALYSIS FISTULA CREATION Right 10/2017   right chest perm cath  . DIALYSIS/PERMA CATHETER REMOVAL N/A 09/13/2018   Procedure: DIALYSIS/PERMA CATHETER REMOVAL;  Surgeon: Algernon Huxley, MD;  Location: [place] [facility];  Service: Cardiovascular;  Laterality: N/A;  . ESOPHAGOGASTRODUODENOSCOPY N/A 12/19/2017   Procedure: ESOPHAGOGASTRODUODENOSCOPY (EGD);  Surgeon: Lin Landsman, MD;  Location: [facility];  Service: Gastroenterology;  Laterality: N/A;  . LOWER EXTREMITY ANGIOGRAPHY Left 11/16/2017   Procedure: LOWER EXTREMITY ANGIOGRAPHY;  Surgeon: Algernon Huxley, MD;  Location: [place] [facility];  Service: Cardiovascular;  Laterality: Left;  . LOWER EXTREMITY ANGIOGRAPHY Right 01/18/2018   Procedure: LOWER EXTREMITY ANGIOGRAPHY;  Surgeon: Algernon Huxley, MD;  Location: [place] [facility];  Service: Cardiovascular;  Laterality: Right;  . LOWER EXTREMITY ANGIOGRAPHY  Left 04/02/2018   Procedure: LOWER EXTREMITY ANGIOGRAPHY;  Surgeon: Algernon Huxley, MD;  Location: [place] [facility];  Service: Cardiovascular;  Laterality: Left;  . LOWER EXTREMITY ANGIOGRAPHY Right 04/09/2018   Procedure: Lower Extremity Angiography with possible intervention;  Surgeon: Algernon Huxley, MD;  Location: [place] [facility];  Service: Cardiovascular;  Laterality: Right;  . LOWER EXTREMITY ANGIOGRAPHY Right 07/23/2018   Procedure: Lower  Extremity Angiography;  Surgeon: Algernon Huxley, MD;  Location: [place] [facility];  Service: Cardiovascular;  Laterality: Right;  . LOWER EXTREMITY ANGIOGRAPHY Right 09/13/2018   Procedure: LOWER EXTREMITY ANGIOGRAPHY;  Surgeon: Algernon Huxley, MD;  Location: [place] [facility];  Service: Cardiovascular;  Laterality: Right;  . LOWER EXTREMITY VENOGRAPHY Right 09/13/2018   Procedure: LOWER EXTREMITY VENOGRAPHY;  Surgeon: Algernon Huxley, MD;  Location: [place] [facility];  Service: Cardiovascular;  Laterality: Right;  . PERIPHERAL VASCULAR CATHETERIZATION Left 09/01/2015   Procedure: A/V Shuntogram/Fistulagram;  Surgeon: Katha Cabal, MD;  Location: [place] [facility];  Service: Cardiovascular;  Laterality: Left;  . PERIPHERAL VASCULAR CATHETERIZATION N/A 09/30/2015   Procedure: A/V Shuntogram/Fistulagram with perm cathether removal;  Surgeon: Algernon Huxley, MD;  Location: [place] [facility];  Service: Cardiovascular;  Laterality: N/A;  . PERIPHERAL VASCULAR CATHETERIZATION Left 09/30/2015   Procedure: A/V Shunt Intervention;  Surgeon: Algernon Huxley, MD;  Location: [place] [facility];  Service: Cardiovascular;  Laterality: Left;  . PERIPHERAL VASCULAR CATHETERIZATION Left 12/03/2015   Procedure: Thrombectomy;  Surgeon: Algernon Huxley, MD;  Location: [place] [facility];  Service: Cardiovascular;  Laterality: Left;  . PERIPHERAL VASCULAR CATHETERIZATION Left 01/28/2016   Procedure: Thrombectomy;  Surgeon: Algernon Huxley, MD;  Location: [place] [facility];  Service: Cardiovascular;  Laterality: Left;  . PERIPHERAL VASCULAR CATHETERIZATION N/A 01/28/2016   Procedure: A/V Shuntogram/Fistulagram;  Surgeon: Algernon Huxley, MD;  Location: [place] [facility];  Service: Cardiovascular;  Laterality: N/A;  . SKIN SPLIT GRAFT Right 05/24/2018   Procedure: SKIN GRAFT SPLIT THICKNESS ( RIGHT CALF);  Surgeon: Algernon Huxley, MD;  Location: [facility];  Service: Vascular;  Laterality: Right;  . UPPER EXTREMITY ANGIOGRAPHY   10/24/2018   Procedure: Upper Extremity Angiography;  Surgeon: Algernon Huxley, MD;  Location: [place] [facility];  Service: Cardiovascular;;  . UPPER EXTREMITY ANGIOGRAPHY Right 02/14/2019   Procedure: UPPER EXTREMITY ANGIOGRAPHY;  Surgeon: Algernon Huxley, MD;  Location: [place] [facility];  Service: Cardiovascular;  Laterality: Right;  . WOUND DEBRIDEMENT Right 04/11/2018   Procedure: DEBRIDEMENT WOUND calf muscle and skin;  Surgeon: Algernon Huxley, MD;  Location: [facility];  Service: Vascular;  Laterality: Right;    There were no vitals filed for this visit.   Subjective Assessment - 03/17/20 0954    Subjective Pt has no complaints, doing well this session.  Denies any fall since last therapy session.  Patient reports that the prosthetist is currently fashioning a new socket.  No specific questions or concerns.    Pertinent History Pt underwent Rt AKA amputation in June 2020 after difficulty with wound healing/infection. Pt DC hospital to SNF for rehab. He has since been back at home. Pt uses a manual WC or power chair for daily mobility. Pt takes tranportation services to HD MWF. Pt was recently fitted for his AKA prosthesis in Jan 2021.    Limitations Standing;Walking;House hold activities    How long can you sit comfortably? No difficulty    How  long can you stand comfortably? 1 minute    How long can you walk comfortably? unable    Currently in Pain? No/denies           Treatment:  Gait training with RW 100 feet x2with cGA and RLE is having better control with locking and unlocking the knee and getting the timing smooth Transfer training sit to stand from power wc <> nu-step withCGA Transfer training from power wc <> stand with RW x 5 , cues for sequencing and safety 5 x sit to standwith cues for sequencing and safety Patient performed with instruction, verbal cues, tactile cues of therapist: goal:increase tissue extensibility, promote proper posture, improve  mobility                          PT Education - 03/17/20 0954    Education Details HEP    Person(s) Educated Patient    Methods Explanation    Comprehension Verbalized understanding;Need further instruction            PT Short Term Goals - 11/26/19 0956      PT SHORT TERM GOAL #1   Title Pt to demonstrate ability to perfrom STS transfer from chair c RW minGuard assist and lock his RLE knee joint without assist.    Baseline ModA from elevated surface at eval; 08/29/19: minA+1 with BUE support from regular height chair with arm rests; still requires elevated surface to perform. 7/20 able to perform STS from regular WC with RW and CGA, extended time needed to lock R knee joint and CGA for initialy steadying. highly reliant on UE    Time 4    Period Weeks    Status On-going    Target Date 12/24/19      PT SHORT TERM GOAL #2   Title Pt to demonstrate 5 degrees hip flexion 10 degrees hip abdct ROM.    Baseline lacks 5 degrees from neutral in both at eval; At visit 10, has 0 degrees flexion/extension; still lacking 5 degrees ABDCT neutral; 08/29/19: R hip: lacking 5 degrees of extension, 15 degrees abduction    Time 4    Period Weeks    Status Achieved    Target Date 06/25/19             PT Long Term Goals - 03/17/20 1029      PT LONG TERM GOAL #1   Title Pt to demonstrates 15 degrees Rt hip ABDCT, 10 degrees Rt hip extension P/ROM to facilitate prosthesis motor control.    Baseline 08/29/19: R hip: lacking 5 degrees of extension, 15 degrees abduction, 10/15/19= R hip lacking 10 deg extension; 7/20 goal deferred to next session  01/07/20+R hip lacking 10 deg extension    Time 12    Period Weeks    Status On-going    Target Date 06/11/20      PT LONG TERM GOAL #2   Title Pt to demonstrate 5/5 strength hip extension at 0 degrees flexion/extension    Baseline has 5/5 in some ranges, but not in ranges specific to standing or gait.; 08/29/19: Unable to get to  neutral flexion extension but 5/5 R hip extension in other ranges, 10/15/19= -3/5 right hip extension; 7/20 deferred to next session8/31/21=-3/5 right hip extension    Time 12    Period Weeks    Status On-going    Target Date 06/11/20      PT LONG TERM GOAL #3   Title  Pt able to perfrom 5x STS from chair with RW, arms ad lib in <21sec    Baseline On 6/22 c RW and chair + 1 airex pad: 22.95sec (largely avoids use of RLE for power); 7/20, able to perform from standard WC height, 1:09seconds with BUE use.01/07/20=    Time 12    Period Weeks    Status On-going    Target Date 06/11/20      PT LONG TERM GOAL #4   Title Pt will be able to ambulate at least 100' with RW and supervision with proper locking of R prosthesis in order to demonstrate safe household ambulation in order to improve independence with gait without need for power wheelchair    Baseline Tolerating distance, but speed is inapropriate for distance for energy conservation and safety. Making progress, but needs more improvement.01/07/20= Patient has B hip flex and R knee flex during ambulation 100 feet with RLE buckling x 1 and min assisst with RW    Time 12    Period Weeks    Status On-going    Target Date 06/11/20      PT LONG TERM GOAL #5   Title Patient will continue to maintain a FOTO score of 52 or higher to indicate improvement from initial evaluation in functional activities.    Baseline initial score 48, on 7/20 score 55, 01/07/20=40.02 sec, 02/25/20=52    Time 12    Period Weeks    Status On-going    Target Date 06/11/20                 Plan - 03/17/20 0956    Clinical Impression Statement Patient performs mobiity training with VC for safety during transfers and with locking R knee during gait. Patient needs cues for upright posture. Patient will continue to benefit from skilled PT to improve mobility and strength    Personal Factors and Comorbidities Age;Fitness;Past/Current Experience;Education;Transportation     Examination-Activity Limitations Transfers;Dressing;Squat;Stairs;Stand;Bed Mobility    Examination-Participation Restrictions Community Activity    Stability/Clinical Decision Making Stable/Uncomplicated    Rehab Potential Good    PT Frequency 2x / week    PT Duration 12 weeks    PT Treatment/Interventions ADLs/Self Care Home Management;Cryotherapy;Electrical Stimulation;DME Instruction;Gait training;Stair training;Functional mobility training;Therapeutic activities;Therapeutic exercise;Balance training;Neuromuscular re-education;Cognitive remediation;Patient/family education;Prosthetic Training;Wheelchair mobility training;Passive range of motion;Dry needling;Scar mobilization;Spinal Manipulations;Joint Manipulations           Patient will benefit from skilled therapeutic intervention in order to improve the following deficits and impairments:  Decreased activity tolerance, Decreased balance, Decreased mobility, Decreased strength, Decreased cognition, Decreased knowledge of use of DME, Decreased knowledge of precautions, Decreased endurance, Decreased range of motion, Difficulty walking, Decreased safety awareness, Decreased skin integrity, Decreased scar mobility, Hypomobility, Postural dysfunction, Increased edema, Increased muscle spasms, Increased fascial restricitons, Prosthetic Dependency, Impaired tone, Improper body mechanics  Visit Diagnosis: Difficulty in walking, not elsewhere classified - Plan: PT plan of care cert/re-cert  Unsteadiness on feet - Plan: PT plan of care cert/re-cert     Problem List Patient Active Problem List   Diagnosis Date Noted  . Cardiomyopathy ([place]) 01/22/2019  . CHF (congestive heart failure) ([place]) 01/22/2019  . Coronary disease 01/22/2019  . Above knee amputation of right lower extremity ([place]) 11/13/2018  . Leg ulcer, right, with fat layer exposed ([place]) 10/22/2018  . Cellulitis of right leg 07/21/2018  . Lower limb ulcer, calf, right, limited to  breakdown of skin ([place]) 06/08/2018  . PVD (peripheral vascular disease) ([place]) 04/17/2018  . Malnutrition  of moderate degree 04/11/2018  . Pressure injury of skin 04/06/2018  . Altered mental status 04/04/2018  . Hypothermia 04/04/2018  . Hemodialysis graft malfunction ([place]) 03/26/2018  . Hypercholesterolemia 02/27/2018  . Diabetes ([place]) 02/27/2018  . Weakness of right lower extremity 01/20/2018  . Fever   . Periumbilical abdominal pain   . Confusion 12/22/2017  . Acute delirium 12/21/2017  . Protein-calorie malnutrition, severe 12/19/2017  . Intractable nausea and vomiting 12/18/2017  . Lymphedema 12/13/2017  . Cellulitis 11/27/2017  . Chest pain 11/19/2017  . Atherosclerosis of native arteries of the extremities with ulceration ([place]) 11/07/2017  . Twitching 01/03/2017  . Elevated troponin 10/02/2015  . Complications, dialysis, catheter, mechanical ([place]) 10/02/2015  . Musculoskeletal chest pain 09/28/2015  . Chronic diastolic CHF (congestive heart failure) ([place]) 09/28/2015  . End stage renal disease ([place]) 10/09/2012  . Hypertension 10/09/2012  . Gout 10/09/2012    Arelia Sneddon S,PT DPT 03/17/2020, 10:38 AM  [place] MAIN [facility] [address] [place], [place], [address] Phone: [phone]   Fax:  [phone]  Name: Johnny [name]. MRN: [id number] Date of Birth: 1951/03/31

## 2020-03-19 ENCOUNTER — Ambulatory Visit: Payer: Medicare Other | Admitting: Physical Therapy

## 2020-03-24 ENCOUNTER — Encounter: Payer: Self-pay | Admitting: Physical Therapy

## 2020-03-24 ENCOUNTER — Ambulatory Visit: Payer: Medicare Other | Admitting: Physical Therapy

## 2020-03-24 ENCOUNTER — Other Ambulatory Visit: Payer: Self-pay

## 2020-03-24 DIAGNOSIS — R262 Difficulty in walking, not elsewhere classified: Secondary | ICD-10-CM

## 2020-03-24 DIAGNOSIS — R2681 Unsteadiness on feet: Secondary | ICD-10-CM

## 2020-03-24 NOTE — Therapy (Signed)
[place] MAIN [facility] SERVICES [address] [place], [place], [address] Phone: [phone]   Fax:  [phone]  Physical Therapy Treatment  Patient Details  Name: Johnny [name]. MRN: [id number] Date of Birth: [birth year] Referring Provider (PT): Eulogio Ditch, NP    Encounter Date: 03/24/2020   PT End of Session - 03/24/20 0942    Visit Number 85    Number of Visits 81    Date for PT Re-Evaluation 06/11/20    PT Start Time 0840    PT Stop Time 0925    PT Time Calculation (min) 45 min    Equipment Utilized During Treatment Gait belt    Activity Tolerance Patient tolerated treatment well;No increased pain    Behavior During Therapy WFL for tasks assessed/performed           Past Medical History:  Diagnosis Date  . Anemia   . Anxiety   . CHF (congestive heart failure) ([place])   . Chronic kidney disease    esrd dialysis m/w/f  . Gout   . Hyperlipidemia   . Hypertension   . Myocardial infarction ([place]) 2010   10 years ago  . 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  . Peripheral vascular disease [facility])     Past Surgical History:  Procedure Laterality Date  . A/V FISTULAGRAM Right 09/06/2018   Procedure: A/V FISTULAGRAM;  Surgeon: Algernon Huxley, MD;  Location: [place] CV LAB;  Service: Cardiovascular;  Laterality: Right;  . A/V SHUNTOGRAM Left 06/21/2017   Procedure: A/V SHUNTOGRAM;  Surgeon: Katha Cabal, MD;  Location: [place] CV LAB;  Service: Cardiovascular;  Laterality: Left;  . A/V SHUNTOGRAM N/A 10/24/2018   Procedure: A/V SHUNTOGRAM;  Surgeon: Algernon Huxley, MD;  Location: [place] CV LAB;  Service: Cardiovascular;  Laterality: N/A;  . ABOVE KNEE LEG AMPUTATION Right 2020  . AMPUTATION Right 10/25/2018   Procedure: AMPUTATION ABOVE KNEE;  Surgeon: Algernon Huxley, MD;  Location: [facility] ORS;  Service: General;  Laterality: Right;  . APPLICATION OF WOUND VAC Right 04/11/2018    Procedure: APPLICATION OF WOUND VAC;  Surgeon: Algernon Huxley, MD;  Location: [facility] ORS;  Service: Vascular;  Laterality: Right;  . AV FISTULA PLACEMENT Left 09/18/2015   Procedure: INSERTION OF ARTERIOVENOUS (AV) GORE-TEX GRAFT ARM ( BRACH/AXILLARY GRAFT W/ INSTANT STICK GRAFT );  Surgeon: Katha Cabal, MD;  Location: [facility] ORS;  Service: Vascular;  Laterality: Left;  . AV FISTULA PLACEMENT Right 07/19/2018   Procedure: INSERTION OF ARTERIOVENOUS (AV) GORE-TEX GRAFT ARM ( BRACHIAL AXILLARY);  Surgeon: Algernon Huxley, MD;  Location: [facility] ORS;  Service: Vascular;  Laterality: Right;  . DIALYSIS FISTULA CREATION Right 10/2017   right chest perm cath  . DIALYSIS/PERMA CATHETER REMOVAL N/A 09/13/2018   Procedure: DIALYSIS/PERMA CATHETER REMOVAL;  Surgeon: Algernon Huxley, MD;  Location: [place] CV LAB;  Service: Cardiovascular;  Laterality: N/A;  . ESOPHAGOGASTRODUODENOSCOPY N/A 12/19/2017   Procedure: ESOPHAGOGASTRODUODENOSCOPY (EGD);  Surgeon: Lin Landsman, MD;  Location[facility];  Service: Gastroenterology;  Laterality: N/A;  . LOWER EXTREMITY ANGIOGRAPHY Left 11/16/2017   Procedure: LOWER EXTREMITY ANGIOGRAPHY;  Surgeon: Algernon Huxley, MD;  Location: [place] CV LAB;  Service: Cardiovascular;  Laterality: Left;  . LOWER EXTREMITY ANGIOGRAPHY Right 01/18/2018   Procedure: LOWER EXTREMITY ANGIOGRAPHY;  Surgeon: Algernon Huxley, MD;  Location: [place] CV LAB;  Service: Cardiovascular;  Laterality: Right;  . LOWER EXTREMITY ANGIOGRAPHY  Left 04/02/2018   Procedure: LOWER EXTREMITY ANGIOGRAPHY;  Surgeon: Algernon Huxley, MD;  Location: [place] CV LAB;  Service: Cardiovascular;  Laterality: Left;  . LOWER EXTREMITY ANGIOGRAPHY Right 04/09/2018   Procedure: Lower Extremity Angiography with possible intervention;  Surgeon: Algernon Huxley, MD;  Location: [place] [facility] LAB;  Service: Cardiovascular;  Laterality: Right;  . LOWER EXTREMITY ANGIOGRAPHY Right 07/23/2018   Procedure: Lower  Extremity Angiography;  Surgeon: Algernon Huxley, MD;  Location: [place] [facility] LAB;  Service: Cardiovascular;  Laterality: Right;  . LOWER EXTREMITY ANGIOGRAPHY Right 09/13/2018   Procedure: LOWER EXTREMITY ANGIOGRAPHY;  Surgeon: Algernon Huxley, MD;  Location: [place] [facility] LAB;  Service: Cardiovascular;  Laterality: Right;  . LOWER EXTREMITY VENOGRAPHY Right 09/13/2018   Procedure: LOWER EXTREMITY VENOGRAPHY;  Surgeon: Algernon Huxley, MD;  Location: [place] CV LAB;  Service: Cardiovascular;  Laterality: Right;  . PERIPHERAL VASCULAR CATHETERIZATION Left 09/01/2015   Procedure: A/V Shuntogram/Fistulagram;  Surgeon: Katha Cabal, MD;  Location: [place] [facility] LAB;  Service: Cardiovascular;  Laterality: Left;  . PERIPHERAL VASCULAR CATHETERIZATION N/A 09/30/2015   Procedure: A/V Shuntogram/Fistulagram with perm cathether removal;  Surgeon: Algernon Huxley, MD;  Location: [place] [facility];  Service: Cardiovascular;  Laterality: N/A;  . PERIPHERAL VASCULAR CATHETERIZATION Left 09/30/2015   Procedure: A/V Shunt Intervention;  Surgeon: Algernon Huxley, MD;  Location: [name] CV LAB;  Service: Cardiovascular;  Laterality: Left;  . PERIPHERAL VASCULAR CATHETERIZATION Left 12/03/2015   Procedure: Thrombectomy;  Surgeon: Algernon Huxley, MD;  Location: [place] CV LAB;  Service: Cardiovascular;  Laterality: Left;  . PERIPHERAL VASCULAR CATHETERIZATION Left 01/28/2016   Procedure: Thrombectomy;  Surgeon: Algernon Huxley, MD;  Location: [place] CV LAB;  Service: Cardiovascular;  Laterality: Left;  . PERIPHERAL VASCULAR CATHETERIZATION N/A 01/28/2016   Procedure: A/V Shuntogram/Fistulagram;  Surgeon: Algernon Huxley, MD;  Location: [place] CV LAB;  Service: Cardiovascular;  Laterality: N/A;  . SKIN SPLIT GRAFT Right 05/24/2018   Procedure: SKIN GRAFT SPLIT THICKNESS ( RIGHT CALF);  Surgeon: Algernon Huxley, MD;  Location: [facility] ORS;  Service: Vascular;  Laterality: Right;  . UPPER EXTREMITY ANGIOGRAPHY   10/24/2018   Procedure: Upper Extremity Angiography;  Surgeon: Algernon Huxley, MD;  Location: [place] CV LAB;  Service: Cardiovascular;;  . UPPER EXTREMITY ANGIOGRAPHY Right 02/14/2019   Procedure: UPPER EXTREMITY ANGIOGRAPHY;  Surgeon: Algernon Huxley, MD;  Location: [place] CV LAB;  Service: Cardiovascular;  Laterality: Right;  . WOUND DEBRIDEMENT Right 04/11/2018   Procedure: DEBRIDEMENT WOUND calf muscle and skin;  Surgeon: Algernon Huxley, MD;  Location: [facility] ORS;  Service: Vascular;  Laterality: Right;    There were no vitals filed for this visit.   Subjective Assessment - 03/24/20 0941    Subjective Pt has no complaints, doing well this session.  Denies any fall since last therapy session.  Patient reports that the prosthetist is currently fashioning a new socket.  No specific questions or concerns.    Pertinent History Pt underwent Rt AKA amputation in June 2020 after difficulty with wound healing/infection. Pt DC hospital to SNF for rehab. He has since been back at home. Pt uses a manual WC or power chair for daily mobility. Pt takes tranportation services to HD MWF. Pt was recently fitted for his AKA prosthesis in Jan 2021.    Limitations Standing;Walking;House hold activities    How long can you sit comfortably? No difficulty    How  long can you stand comfortably? 1 minute    How long can you walk comfortably? unable    Currently in Pain? No/denies    Pain Score 0-No pain             Treatment: Nu-step x 10 mins , L4 Gait training with RW 100 feet x1with CGA and RLE is having better control with locking and unlocking the knee and getting the timing smooth Transfer training sit to stand from power wc <> nu-step withCGA Transfer training from power wc <> stand with RW x 5 , cues for sequencing and safety 5 x sit to standwith cues for sequencing and safety Patient performed with instruction, verbal cues, tactile cues of therapist: goal:increase tissue extensibility,  promote proper posture, improve mobility                         PT Education - 03/24/20 0941    Education Details HEP- prone stretching    Person(s) Educated Patient    Methods Explanation    Comprehension Verbalized understanding;Need further instruction            PT Short Term Goals - 11/26/19 0956      PT SHORT TERM GOAL #1   Title Pt to demonstrate ability to perfrom STS transfer from chair c RW minGuard assist and lock his RLE knee joint without assist.    Baseline ModA from elevated surface at eval; 08/29/19: minA+1 with BUE support from regular height chair with arm rests; still requires elevated surface to perform. 7/20 able to perform STS from regular WC with RW and CGA, extended time needed to lock R knee joint and CGA for initialy steadying. highly reliant on UE    Time 4    Period Weeks    Status On-going    Target Date 12/24/19      PT SHORT TERM GOAL #2   Title Pt to demonstrate 5 degrees hip flexion 10 degrees hip abdct ROM.    Baseline lacks 5 degrees from neutral in both at eval; At visit 10, has 0 degrees flexion/extension; still lacking 5 degrees ABDCT neutral; 08/29/19: R hip: lacking 5 degrees of extension, 15 degrees abduction    Time 4    Period Weeks    Status Achieved    Target Date 06/25/19             PT Long Term Goals - 03/17/20 1029      PT LONG TERM GOAL #1   Title Pt to demonstrates 15 degrees Rt hip ABDCT, 10 degrees Rt hip extension P/ROM to facilitate prosthesis motor control.    Baseline 08/29/19: R hip: lacking 5 degrees of extension, 15 degrees abduction, 10/15/19= R hip lacking 10 deg extension; 7/20 goal deferred to next session  01/07/20+R hip lacking 10 deg extension    Time 12    Period Weeks    Status On-going    Target Date 06/11/20      PT LONG TERM GOAL #2   Title Pt to demonstrate 5/5 strength hip extension at 0 degrees flexion/extension    Baseline has 5/5 in some ranges, but not in ranges specific to  standing or gait.; 08/29/19: Unable to get to neutral flexion extension but 5/5 R hip extension in other ranges, 10/15/19= -3/5 right hip extension; 7/20 deferred to next session8/31/21=-3/5 right hip extension    Time 12    Period Weeks    Status On-going    Target  Date 06/11/20      PT LONG TERM GOAL #3   Title Pt able to perfrom 5x STS from chair with RW, arms ad lib in <21sec    Baseline On 6/22 c RW and chair + 1 airex pad: 22.95sec (largely avoids use of RLE for power); 7/20, able to perform from standard WC height, 1:09seconds with BUE use.01/07/20=    Time 12    Period Weeks    Status On-going    Target Date 06/11/20      PT LONG TERM GOAL #4   Title Pt will be able to ambulate at least 100' with RW and supervision with proper locking of R prosthesis in order to demonstrate safe household ambulation in order to improve independence with gait without need for power wheelchair    Baseline Tolerating distance, but speed is inapropriate for distance for energy conservation and safety. Making progress, but needs more improvement.01/07/20= Patient has B hip flex and R knee flex during ambulation 100 feet with RLE buckling x 1 and min assisst with RW    Time 12    Period Weeks    Status On-going    Target Date 06/11/20      PT LONG TERM GOAL #5   Title Patient will continue to maintain a FOTO score of 52 or higher to indicate improvement from initial evaluation in functional activities.    Baseline initial score 48, on 7/20 score 55, 01/07/20=40.02 sec, 02/25/20=52    Time 12    Period Weeks    Status On-going    Target Date 06/11/20                 Plan - 03/24/20 0943    Clinical Impression Statement Patient is instructed in gait training with vC for sequencing and VC for safety with transfers from various surface heights. He performs strength training and has no reports of pain. Patient will continue to benefit from skilled PT to improve mobility and safety.    Personal Factors  and Comorbidities Age;Fitness;Past/Current Experience;Education;Transportation    Examination-Activity Limitations Transfers;Dressing;Squat;Stairs;Stand;Bed Mobility    Examination-Participation Restrictions Community Activity    Stability/Clinical Decision Making Stable/Uncomplicated    Rehab Potential Good    PT Frequency 2x / week    PT Duration 12 weeks    PT Treatment/Interventions ADLs/Self Care Home Management;Cryotherapy;Electrical Stimulation;DME Instruction;Gait training;Stair training;Functional mobility training;Therapeutic activities;Therapeutic exercise;Balance training;Neuromuscular re-education;Cognitive remediation;Patient/family education;Prosthetic Training;Wheelchair mobility training;Passive range of motion;Dry needling;Scar mobilization;Spinal Manipulations;Joint Manipulations           Patient will benefit from skilled therapeutic intervention in order to improve the following deficits and impairments:  Decreased activity tolerance, Decreased balance, Decreased mobility, Decreased strength, Decreased cognition, Decreased knowledge of use of DME, Decreased knowledge of precautions, Decreased endurance, Decreased range of motion, Difficulty walking, Decreased safety awareness, Decreased skin integrity, Decreased scar mobility, Hypomobility, Postural dysfunction, Increased edema, Increased muscle spasms, Increased fascial restricitons, Prosthetic Dependency, Impaired tone, Improper body mechanics  Visit Diagnosis: Difficulty in walking, not elsewhere classified  Unsteadiness on feet     Problem List Patient Active Problem List   Diagnosis Date Noted  . Cardiomyopathy ([place]) 01/22/2019  . CHF (congestive heart failure) ([place]) 01/22/2019  . Coronary disease 01/22/2019  . Above knee amputation of right lower extremity ([place]) 11/13/2018  . Leg ulcer, right, with fat layer exposed ([place]) 10/22/2018  . Cellulitis of right leg 07/21/2018  . Lower limb ulcer, calf, right,  limited to breakdown of skin ([place]) 06/08/2018  . PVD (  peripheral vascular disease) ([place]) 04/17/2018  . Malnutrition of moderate degree 04/11/2018  . Pressure injury of skin 04/06/2018  . Altered mental status 04/04/2018  . Hypothermia 04/04/2018  . Hemodialysis graft malfunction ([place]) 03/26/2018  . Hypercholesterolemia 02/27/2018  . Diabetes ([place]) 02/27/2018  . Weakness of right lower extremity 01/20/2018  . Fever   . Periumbilical abdominal pain   . Confusion 12/22/2017  . Acute delirium 12/21/2017  . Protein-calorie malnutrition, severe 12/19/2017  . Intractable nausea and vomiting 12/18/2017  . Lymphedema 12/13/2017  . Cellulitis 11/27/2017  . Chest pain 11/19/2017  . Atherosclerosis of native arteries of the extremities with ulceration ([place]) 11/07/2017  . Twitching 01/03/2017  . Elevated troponin 10/02/2015  . Complications, dialysis, catheter, mechanical ([place]) 10/02/2015  . Musculoskeletal chest pain 09/28/2015  . Chronic diastolic CHF (congestive heart failure) ([place]) 09/28/2015  . End stage renal disease ([place]) 10/09/2012  . Hypertension 10/09/2012  . Gout 10/09/2012    Alanson Puls, PT DPT 03/24/2020, 9:43 AM  [place] MAIN [facility] SERVICES [address] [place], [place], [address] Phone: [phone]   Fax:  [phone]  Name: Johnny [name]. MRN: [id number] Date of Birth: [birth year]

## 2020-03-26 ENCOUNTER — Ambulatory Visit: Payer: Medicare Other | Admitting: Physical Therapy

## 2020-03-31 ENCOUNTER — Ambulatory Visit: Payer: Medicare Other | Admitting: Physical Therapy

## 2020-04-06 ENCOUNTER — Telehealth (INDEPENDENT_AMBULATORY_CARE_PROVIDER_SITE_OTHER): Payer: Self-pay

## 2020-04-06 ENCOUNTER — Other Ambulatory Visit: Admission: RE | Admit: 2020-04-06 | Payer: Medicare Other | Source: Ambulatory Visit

## 2020-04-06 NOTE — Telephone Encounter (Signed)
A fax and a phone call was received from [place] at [facility] regarding the patient needing a RUA thrombectomy due to not being able to have dialysis. I contacted [facility] and was told to have the patient covid tested and he will have his procedure this afternoon. Patient requires anesthesia for all angio procedures. Elta Guadeloupe called back to let me know  That [facility] will not take him to have a covid test as they only pick up and return to designated sites that were planned. Per Crystal in the OR there isn't any OR time available. Elta Guadeloupe was informed of this.

## 2020-04-07 ENCOUNTER — Ambulatory Visit: Payer: Medicare Other

## 2020-04-09 ENCOUNTER — Ambulatory Visit: Payer: Medicare Other

## 2020-04-13 ENCOUNTER — Other Ambulatory Visit (INDEPENDENT_AMBULATORY_CARE_PROVIDER_SITE_OTHER): Payer: Self-pay | Admitting: Vascular Surgery

## 2020-04-13 DIAGNOSIS — R200 Anesthesia of skin: Secondary | ICD-10-CM

## 2020-04-13 DIAGNOSIS — N186 End stage renal disease: Secondary | ICD-10-CM

## 2020-04-14 ENCOUNTER — Ambulatory Visit: Payer: Medicare Other

## 2020-04-14 ENCOUNTER — Telehealth (INDEPENDENT_AMBULATORY_CARE_PROVIDER_SITE_OTHER): Payer: Self-pay

## 2020-04-14 ENCOUNTER — Ambulatory Visit (INDEPENDENT_AMBULATORY_CARE_PROVIDER_SITE_OTHER): Payer: Medicare Other

## 2020-04-14 ENCOUNTER — Ambulatory Visit (INDEPENDENT_AMBULATORY_CARE_PROVIDER_SITE_OTHER): Payer: Medicare Other | Admitting: Nurse Practitioner

## 2020-04-14 ENCOUNTER — Other Ambulatory Visit: Payer: Self-pay

## 2020-04-14 ENCOUNTER — Encounter (INDEPENDENT_AMBULATORY_CARE_PROVIDER_SITE_OTHER): Payer: Self-pay | Admitting: Vascular Surgery

## 2020-04-14 VITALS — BP 148/66 | HR 81 | Resp 16 | Wt 169.0 lb

## 2020-04-14 DIAGNOSIS — N186 End stage renal disease: Secondary | ICD-10-CM

## 2020-04-14 DIAGNOSIS — E785 Hyperlipidemia, unspecified: Secondary | ICD-10-CM | POA: Diagnosis not present

## 2020-04-14 DIAGNOSIS — I10 Essential (primary) hypertension: Secondary | ICD-10-CM | POA: Diagnosis not present

## 2020-04-14 DIAGNOSIS — E1169 Type 2 diabetes mellitus with other specified complication: Secondary | ICD-10-CM

## 2020-04-14 DIAGNOSIS — R200 Anesthesia of skin: Secondary | ICD-10-CM

## 2020-04-14 NOTE — Telephone Encounter (Signed)
Spoke with the patient's caregiver and the patient is schedule for a rue angio with Dr. Lucky Cowboy on 04/20/20 with a 11:15 am arrival time to the [facility]. Covid testing on 04/16/20 between 8-1 pm. I attempted to get anesthesia for this procedure but it was not available.

## 2020-04-16 ENCOUNTER — Ambulatory Visit: Payer: Medicare Other

## 2020-04-16 ENCOUNTER — Other Ambulatory Visit
Admission: RE | Admit: 2020-04-16 | Discharge: 2020-04-16 | Disposition: A | Discharge status: Home / Self Care | Payer: Medicare Other | Source: Ambulatory Visit | Attending: Vascular Surgery | Admitting: Vascular Surgery

## 2020-04-16 ENCOUNTER — Encounter (INDEPENDENT_AMBULATORY_CARE_PROVIDER_SITE_OTHER): Payer: Self-pay | Admitting: Nurse Practitioner

## 2020-04-16 ENCOUNTER — Other Ambulatory Visit: Payer: Self-pay

## 2020-04-16 DIAGNOSIS — Z20822 Contact with and (suspected) exposure to covid-19: Secondary | ICD-10-CM | POA: Insufficient documentation

## 2020-04-16 DIAGNOSIS — Z01812 Encounter for preprocedural laboratory examination: Secondary | ICD-10-CM | POA: Insufficient documentation

## 2020-04-16 LAB — SARS CORONAVIRUS 2 (TAT 6-24 HRS): SARS Coronavirus 2: NEGATIVE

## 2020-04-16 NOTE — Progress Notes (Signed)
Subjective:    Patient ID: Johnny Bucco., male    DOB: [birth year], 69 y.o.   MRN: [id number] Chief Complaint  Patient presents with  . Follow-up    right hand cold and numbness    The patient returns to the office for followup of their dialysis access. The function of the access has been stable. The patient denies increased bleeding time or increased recirculation. Patient denies difficulty with cannulation.  However, the patient notes that he has been having severe right upper extremity numbness.  It is so severe that he is barely able to close his hand.  The patient notes that this started after recent declotting hand carry.  No significant arm swelling.  The patient denies redness or swelling at the access site. The patient denies fever or chills at home or while on dialysis.  The patient denies amaurosis fugax or recent TIA symptoms. There are no recent neurological changes noted. The patient denies claudication symptoms or rest pain symptoms. The patient denies history of DVT, PE or superficial thrombophlebitis. The patient denies recent episodes of angina or shortness of breath.   Today the patient has a flow volume of 903.  The AV graft appears to be patent throughout.  The right radial artery has diminished flow in the proximal and distal segments.  The right axillary artery appears to display heterogeneous calcified plaque.       Review of Systems  Musculoskeletal: Positive for gait problem.  Neurological: Positive for numbness.       Objective:   Physical Exam Vitals reviewed.  HENT:     Head: Normocephalic.  Cardiovascular:     Rate and Rhythm: Normal rate.     Pulses:          Radial pulses are 0 on the right side.     Arteriovenous access: right arteriovenous access is present.    Comments: Good thrill and bruit, right brachial axillary graft Pulmonary:     Effort: Pulmonary effort is normal.  Musculoskeletal:     Right Lower Extremity: Right leg is  amputated below knee.  Skin:    General: Skin is warm and dry.  Neurological:     Mental Status: He is alert and oriented to person, place, and time.     Motor: Weakness present.  Psychiatric:        Mood and Affect: Mood normal.        Behavior: Behavior normal.        Thought Content: Thought content normal.        Judgment: Judgment normal.     BP (!) 148/66 (BP Location: Left Arm)   Pulse 81   Resp 16   Wt 169 lb (76.7 kg)   BMI 25.70 kg/m   Past Medical History:  Diagnosis Date  . Anemia   . Anxiety   . CHF (congestive heart failure) ([place])   . Chronic kidney disease    esrd dialysis m/w/f  . Gout   . Hyperlipidemia   . Hypertension   . Myocardial infarction ([place]) 2010   10 years ago  . 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  . Peripheral vascular disease ([place])     Social History   Socioeconomic History  . Marital status: Single    Spouse name: Not on file  . Number of children: 0  . Years of education: Not on file  . Highest education level: Not on file  Occupational History  .  Occupation: retired    Comment: Medical laboratory scientific officer  Tobacco Use  . Smoking status: Former Smoker    Types: Cigarettes    Quit date: 05/17/2005    Years since quitting: 14.9  . Smokeless tobacco: Never Used  Vaping Use  . Vaping Use: Never used  Substance and Sexual Activity  . Alcohol use: No  . Drug use: No  . Sexual activity: Not Currently  Other Topics Concern  . Not on file  Social History Narrative  . Not on file   Social Determinants of Health   Financial Resource Strain: Not on file  Food Insecurity: Not on file  Transportation Needs: Not on file  Physical Activity: Not on file  Stress: Not on file  Social Connections: Not on file  Intimate Partner Violence: Not on file    Past Surgical History:  Procedure Laterality Date  . A/V FISTULAGRAM Right 09/06/2018   Procedure: A/V FISTULAGRAM;  Surgeon: Algernon Huxley,  MD;  Location: [facility] [facility];  Service: Cardiovascular;  Laterality: Right;  . A/V SHUNTOGRAM Left 06/21/2017   Procedure: A/V SHUNTOGRAM;  Surgeon: Katha Cabal, MD;  Location: [place] [facility];  Service: Cardiovascular;  Laterality: Left;  . A/V SHUNTOGRAM N/A 10/24/2018   Procedure: A/V SHUNTOGRAM;  Surgeon: Algernon Huxley, MD;  Location: [place] [facility];  Service: Cardiovascular;  Laterality: N/A;  . ABOVE KNEE LEG AMPUTATION Right 2020  . AMPUTATION Right 10/25/2018   Procedure: AMPUTATION ABOVE KNEE;  Surgeon: Algernon Huxley, MD;  Location: [facility];  Service: General;  Laterality: Right;  . APPLICATION OF WOUND VAC Right 04/11/2018   Procedure: APPLICATION OF WOUND VAC;  Surgeon: Algernon Huxley, MD;  Location: [facility];  Service: Vascular;  Laterality: Right;  . AV FISTULA PLACEMENT Left 09/18/2015   Procedure: INSERTION OF ARTERIOVENOUS (AV) GORE-TEX GRAFT ARM ( BRACH/AXILLARY GRAFT W/ INSTANT STICK GRAFT );  Surgeon: Katha Cabal, MD;  Location: [facility];  Service: Vascular;  Laterality: Left;  . AV FISTULA PLACEMENT Right 07/19/2018   Procedure: INSERTION OF ARTERIOVENOUS (AV) GORE-TEX GRAFT ARM ( BRACHIAL AXILLARY);  Surgeon: Algernon Huxley, MD;  Location: [facility];  Service: Vascular;  Laterality: Right;  . DIALYSIS FISTULA CREATION Right 10/2017   right chest perm cath  . DIALYSIS/PERMA CATHETER REMOVAL N/A 09/13/2018   Procedure: DIALYSIS/PERMA CATHETER REMOVAL;  Surgeon: Algernon Huxley, MD;  Location: [place] [facility];  Service: Cardiovascular;  Laterality: N/A;  . ESOPHAGOGASTRODUODENOSCOPY N/A 12/19/2017   Procedure: ESOPHAGOGASTRODUODENOSCOPY (EGD);  Surgeon: Lin Landsman, MD;  [facility];  Service: Gastroenterology;  Laterality: N/A;  . LOWER EXTREMITY ANGIOGRAPHY Left 11/16/2017   Procedure: LOWER EXTREMITY ANGIOGRAPHY;  Surgeon: Algernon Huxley, MD;  Location: [place] [facility];  Service: Cardiovascular;  Laterality: Left;  . LOWER EXTREMITY  ANGIOGRAPHY Right 01/18/2018   Procedure: LOWER EXTREMITY ANGIOGRAPHY;  Surgeon: Algernon Huxley, MD;  Location: [place] [facility];  Service: Cardiovascular;  Laterality: Right;  . LOWER EXTREMITY ANGIOGRAPHY Left 04/02/2018   Procedure: LOWER EXTREMITY ANGIOGRAPHY;  Surgeon: Algernon Huxley, MD;  Location: [place] [facility];  Service: Cardiovascular;  Laterality: Left;  . LOWER EXTREMITY ANGIOGRAPHY Right 04/09/2018   Procedure: Lower Extremity Angiography with possible intervention;  Surgeon: Algernon Huxley, MD;  Location: [place] [facility];  Service: Cardiovascular;  Laterality: Right;  . LOWER EXTREMITY ANGIOGRAPHY Right 07/23/2018   Procedure: Lower Extremity Angiography;  Surgeon: Algernon Huxley, MD;  Location: [place] [facility];  Service: Cardiovascular;  Laterality: Right;  . LOWER EXTREMITY ANGIOGRAPHY Right 09/13/2018   Procedure: LOWER EXTREMITY ANGIOGRAPHY;  Surgeon: Algernon Huxley, MD;  Location: [place] [facility];  Service: Cardiovascular;  Laterality: Right;  . LOWER EXTREMITY VENOGRAPHY Right 09/13/2018   Procedure: LOWER EXTREMITY VENOGRAPHY;  Surgeon: Algernon Huxley, MD;  Location: [place] [facility];  Service: Cardiovascular;  Laterality: Right;  . PERIPHERAL VASCULAR CATHETERIZATION Left 09/01/2015   Procedure: A/V Shuntogram/Fistulagram;  Surgeon: Katha Cabal, MD;  Location: [place] [facility];  Service: Cardiovascular;  Laterality: Left;  . PERIPHERAL VASCULAR CATHETERIZATION N/A 09/30/2015   Procedure: A/V Shuntogram/Fistulagram with perm cathether removal;  Surgeon: Algernon Huxley, MD;  Location: [place] [facility];  Service: Cardiovascular;  Laterality: N/A;  . PERIPHERAL VASCULAR CATHETERIZATION Left 09/30/2015   Procedure: A/V Shunt Intervention;  Surgeon: Algernon Huxley, MD;  Location: [place] [facility];  Service: Cardiovascular;  Laterality: Left;  . PERIPHERAL VASCULAR CATHETERIZATION Left 12/03/2015   Procedure: Thrombectomy;  Surgeon: Algernon Huxley, MD;  Location: [place] [facility];  Service: Cardiovascular;  Laterality: Left;  . PERIPHERAL VASCULAR CATHETERIZATION Left 01/28/2016   Procedure: Thrombectomy;  Surgeon: Algernon Huxley, MD;  Location: [place] [facility];  Service: Cardiovascular;  Laterality: Left;  . PERIPHERAL VASCULAR CATHETERIZATION N/A 01/28/2016   Procedure: A/V Shuntogram/Fistulagram;  Surgeon: Algernon Huxley, MD;  Location: [place] [facility];  Service: Cardiovascular;  Laterality: N/A;  . SKIN SPLIT GRAFT Right 05/24/2018   Procedure: SKIN GRAFT SPLIT THICKNESS ( RIGHT CALF);  Surgeon: Algernon Huxley, MD;  Location: [facility];  Service: Vascular;  Laterality: Right;  . UPPER EXTREMITY ANGIOGRAPHY  10/24/2018   Procedure: Upper Extremity Angiography;  Surgeon: Algernon Huxley, MD;  Location: [place] [facility];  Service: Cardiovascular;;  . UPPER EXTREMITY ANGIOGRAPHY Right 02/14/2019   Procedure: UPPER EXTREMITY ANGIOGRAPHY;  Surgeon: Algernon Huxley, MD;  Location: [place] [facility];  Service: Cardiovascular;  Laterality: Right;  . WOUND DEBRIDEMENT Right 04/11/2018   Procedure: DEBRIDEMENT WOUND calf muscle and skin;  Surgeon: Algernon Huxley, MD;  Location: [facility];  Service: Vascular;  Laterality: Right;    Family History  Problem Relation Age of Onset  . Hypertension Other   . Heart disease Other   . Diabetes Mother     Allergies  Allergen Reactions  . Shellfish Allergy Anaphylaxis    CBC Latest Ref Rng & Units 01/21/2020 10/30/2018 10/29/2018  WBC 4.0 - 10.5 K/uL 6.6 11.8(H) 13.4(H)  Hemoglobin 13.0 - 17.0 g/dL 11.1(L) 7.8(L) 7.4(L)  Hematocrit 39.0 - 52.0 % 36.3(L) 27.5(L) 26.1(L)  Platelets 150 - 400 K/uL 170 348 363      CMP     Component Value Date/Time   NA 145 01/21/2020 1056   NA 139 01/17/2014 0502   K 5.6 (H) 01/21/2020 1056   K 4.8 01/17/2014 0502   CL 106 01/21/2020 1056   CL 102 01/17/2014 0502   CO2 23 01/21/2020 1056   CO2 27 01/17/2014 0502   GLUCOSE 62 (L) 01/21/2020 1056   GLUCOSE 101 (H) 01/17/2014  0502   BUN 27 (H) 01/21/2020 1056   BUN 62 (H) 01/17/2014 0502   CREATININE 9.15 (H) 01/21/2020 1056   CREATININE 12.30 (H) 01/17/2014 0502   CALCIUM 9.8 01/21/2020 1056   CALCIUM 8.2 (L) 01/17/2014 0502   PROT 6.8 10/22/2018 1948   PROT 7.9 06/20/2011 2221   ALBUMIN  2.5 (L) 10/30/2018 1041   ALBUMIN 3.1 (L) 01/16/2014 1336   AST 14 (L) 10/22/2018 1948   AST 33 06/20/2011 2221   ALT 7 10/22/2018 1948   ALT 22 06/20/2011 2221   ALKPHOS 73 10/22/2018 1948   ALKPHOS 48 (L) 06/20/2011 2221   BILITOT 0.5 10/22/2018 1948   BILITOT 0.3 06/20/2011 2221   GFRNONAA 5 (L) 01/21/2020 1056   GFRNONAA 4 (L) 01/17/2014 0502   GFRAA 6 (L) 01/21/2020 1056   GFRAA 4 (L) 01/17/2014 0502     No results found.     Assessment & Plan:   1. ESRD (end stage renal disease) ([place]) Recommend:  The patient is experiencing increasing problems with their dialysis access.  Patient should have a right upper extremity angiogram with the intention for intervention.  The intention for intervention is to restore appropriate flow and prevent thrombosis and possible loss of the access.  As well as improve the quality of dialysis therapy.  The risks, benefits and alternative therapies were reviewed in detail with the patient.  All questions were answered.  The patient agrees to proceed with angio/intervention.      2. Hypertension, unspecified type Continue antihypertensive medications as already ordered, these medications have been reviewed and there are no changes at this time.   3. Type 2 diabetes mellitus with other specified complication, unspecified whether long term insulin use ([place]) Continue hypoglycemic medications as already ordered, these medications have been reviewed and there are no changes at this time.  Hgb A1C to be monitored as already arranged by primary service   4. Hyperlipidemia, unspecified hyperlipidemia type Continue statin as ordered and reviewed, no changes at this  time    Current Outpatient Medications on File Prior to Visit  Medication Sig Dispense Refill  . allopurinol (ZYLOPRIM) 100 MG tablet Take 100 mg by mouth daily.    [name] amLODipine (NORVASC) 5 MG tablet Take 1 tablet (5 mg total) by mouth daily. 30 tablet 0  . atorvastatin (LIPITOR) 10 MG tablet Take 1 tablet by mouth daily.    . clonazePAM (KLONOPIN) 0.5 MG tablet Take 1 tablet (0.5 mg total) by mouth as needed. 10 tablet 0  . clopidogrel (PLAVIX) 75 MG tablet Take 1 tablet (75 mg total) by mouth daily. 30 tablet 11  . folic acid-vitamin b complex-vitamin c-selenium-zinc (DIALYVITE) 3 MG TABS tablet Take 1 tablet by mouth daily.     [name] gabapentin (NEURONTIN) 100 MG capsule Take 1 capsule (100 mg total) by mouth at bedtime. (Patient taking differently: Take 100 mg by mouth 2 (two) times daily. )    . hydrALAZINE (APRESOLINE) 25 MG tablet Take 25 mg by mouth 2 (two) times daily.    [name] HYDROcodone-acetaminophen (NORCO) 5-325 MG tablet Take 1 tablet by mouth every 6 (six) hours as needed for moderate pain. 20 tablet 0  . losartan (COZAAR) 100 MG tablet Take 100 mg by mouth daily.    . metoprolol tartrate (LOPRESSOR) 25 MG tablet Take 25 mg by mouth every evening.    . Multiple Vitamin (MULTIVITAMIN) LIQD Take 5 mLs by mouth daily.    . sevelamer carbonate (RENVELA) 800 MG tablet Take 2,400 mg by mouth 3 (three) times daily with meals.     [name] aspirin EC 81 MG tablet Take 1 tablet (81 mg total) by mouth daily. (Patient not taking: Reported on 01/21/2020) 150 tablet 2   No current facility-administered medications on file prior to visit.    There are no Patient  Instructions on file for this visit. No follow-ups on file.   Kris Hartmann, NP

## 2020-04-16 NOTE — H&P (View-Only) (Signed)
Subjective:    Patient ID: Johnny [name]., male    DOB: 04-22-1951, 69 y.o.   MRN: [id number] Chief Complaint  Patient presents with  . Follow-up    right hand cold and numbness    The patient returns to the office for followup of their dialysis access. The function of the access has been stable. The patient denies increased bleeding time or increased recirculation. Patient denies difficulty with cannulation.  However, the patient notes that he has been having severe right upper extremity numbness.  It is so severe that he is barely able to close his hand.  The patient notes that this started after recent declotting hand carry.  No significant arm swelling.  The patient denies redness or swelling at the access site. The patient denies fever or chills at home or while on dialysis.  The patient denies amaurosis fugax or recent TIA symptoms. There are no recent neurological changes noted. The patient denies claudication symptoms or rest pain symptoms. The patient denies history of DVT, PE or superficial thrombophlebitis. The patient denies recent episodes of angina or shortness of breath.   Today the patient has a flow volume of 903.  The AV graft appears to be patent throughout.  The right radial artery has diminished flow in the proximal and distal segments.  The right axillary artery appears to display heterogeneous calcified plaque.       Review of Systems  Musculoskeletal: Positive for gait problem.  Neurological: Positive for numbness.       Objective:   Physical Exam Vitals reviewed.  HENT:     Head: Normocephalic.  Cardiovascular:     Rate and Rhythm: Normal rate.     Pulses:          Radial pulses are 0 on the right side.     Arteriovenous access: right arteriovenous access is present.    Comments: Good thrill and bruit, right brachial axillary graft Pulmonary:     Effort: Pulmonary effort is normal.  Musculoskeletal:     Right Lower Extremity: Right leg is  amputated below knee.  Skin:    General: Skin is warm and dry.  Neurological:     Mental Status: He is alert and oriented to person, place, and time.     Motor: Weakness present.  Psychiatric:        Mood and Affect: Mood normal.        Behavior: Behavior normal.        Thought Content: Thought content normal.        Judgment: Judgment normal.     BP (!) 148/66 (BP Location: Left Arm)   Pulse 81   Resp 16   Wt 169 lb (76.7 kg)   BMI 25.70 kg/m   Past Medical History:  Diagnosis Date  . Anemia   . Anxiety   . CHF (congestive heart failure) ([place])   . Chronic kidney disease    esrd dialysis m/w/f  . Gout   . Hyperlipidemia   . Hypertension   . Myocardial infarction ([place]) 2010   10 years ago  . 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  . Peripheral vascular disease ([place])     Social History   Socioeconomic History  . Marital status: Single    Spouse name: Not on file  . Number of children: 0  . Years of education: Not on file  . Highest education level: Not on file  Occupational History  .  Occupation: retired    Comment: Medical laboratory scientific officer  Tobacco Use  . Smoking status: Former Smoker    Types: Cigarettes    Quit date: 05/17/2005    Years since quitting: 14.9  . Smokeless tobacco: Never Used  Vaping Use  . Vaping Use: Never used  Substance and Sexual Activity  . Alcohol use: No  . Drug use: No  . Sexual activity: Not Currently  Other Topics Concern  . Not on file  Social History Narrative  . Not on file   Social Determinants of Health   Financial Resource Strain: Not on file  Food Insecurity: Not on file  Transportation Needs: Not on file  Physical Activity: Not on file  Stress: Not on file  Social Connections: Not on file  Intimate Partner Violence: Not on file    Past Surgical History:  Procedure Laterality Date  . A/V FISTULAGRAM Right 09/06/2018   Procedure: A/V FISTULAGRAM;  Surgeon: Algernon Huxley,  MD;  Location: [facility];  Service: Cardiovascular;  Laterality: Right;  . A/V SHUNTOGRAM Left 06/21/2017   Procedure: A/V SHUNTOGRAM;  Surgeon: Katha Cabal, MD;  Location: [facility];  Service: Cardiovascular;  Laterality: Left;  . A/V SHUNTOGRAM N/A 10/24/2018   Procedure: A/V SHUNTOGRAM;  Surgeon: Algernon Huxley, MD;  Location: [place] [facility];  Service: Cardiovascular;  Laterality: N/A;  . ABOVE KNEE LEG AMPUTATION Right 2020  . AMPUTATION Right 10/25/2018   Procedure: AMPUTATION ABOVE KNEE;  Surgeon: Algernon Huxley, MD;  Location: [facility];  Service: General;  Laterality: Right;  . APPLICATION OF WOUND VAC Right 04/11/2018   Procedure: APPLICATION OF WOUND VAC;  Surgeon: Algernon Huxley, MD;  Location: [facility];  Service: Vascular;  Laterality: Right;  . AV FISTULA PLACEMENT Left 09/18/2015   Procedure: INSERTION OF ARTERIOVENOUS (AV) GORE-TEX GRAFT ARM ( BRACH/AXILLARY GRAFT W/ INSTANT STICK GRAFT );  Surgeon: Katha Cabal, MD;  Location: [facility];  Service: Vascular;  Laterality: Left;  . AV FISTULA PLACEMENT Right 07/19/2018   Procedure: INSERTION OF ARTERIOVENOUS (AV) GORE-TEX GRAFT ARM ( BRACHIAL AXILLARY);  Surgeon: Algernon Huxley, MD;  Location: [facility];  Service: Vascular;  Laterality: Right;  . DIALYSIS FISTULA CREATION Right 10/2017   right chest perm cath  . DIALYSIS/PERMA CATHETER REMOVAL N/A 09/13/2018   Procedure: DIALYSIS/PERMA CATHETER REMOVAL;  Surgeon: Algernon Huxley, MD;  Location: [place] [facility];  Service: Cardiovascular;  Laterality: N/A;  . ESOPHAGOGASTRODUODENOSCOPY N/A 12/19/2017   Procedure: ESOPHAGOGASTRODUODENOSCOPY (EGD);  Surgeon: Lin Landsman, MD;  Location: [facility];  Service: Gastroenterology;  Laterality: N/A;  . LOWER EXTREMITY ANGIOGRAPHY Left 11/16/2017   Procedure: LOWER EXTREMITY ANGIOGRAPHY;  Surgeon: Algernon Huxley, MD;  Location: [place] [facility];  Service: Cardiovascular;  Laterality: Left;  . LOWER EXTREMITY  ANGIOGRAPHY Right 01/18/2018   Procedure: LOWER EXTREMITY ANGIOGRAPHY;  Surgeon: Algernon Huxley, MD;  Location: [place] [facility];  Service: Cardiovascular;  Laterality: Right;  . LOWER EXTREMITY ANGIOGRAPHY Left 04/02/2018   Procedure: LOWER EXTREMITY ANGIOGRAPHY;  Surgeon: Algernon Huxley, MD;  Location: [place] [facility];  Service: Cardiovascular;  Laterality: Left;  . LOWER EXTREMITY ANGIOGRAPHY Right 04/09/2018   Procedure: Lower Extremity Angiography with possible intervention;  Surgeon: Algernon Huxley, MD;  Location: [place] [facility];  Service: Cardiovascular;  Laterality: Right;  . LOWER EXTREMITY ANGIOGRAPHY Right 07/23/2018   Procedure: Lower Extremity Angiography;  Surgeon: Algernon Huxley, MD;  Location: [place] [facility];  Service: Cardiovascular;  Laterality: Right;  . LOWER EXTREMITY ANGIOGRAPHY Right 09/13/2018   Procedure: LOWER EXTREMITY ANGIOGRAPHY;  Surgeon: Algernon Huxley, MD;  Location: [place] [facility];  Service: Cardiovascular;  Laterality: Right;  . LOWER EXTREMITY VENOGRAPHY Right 09/13/2018   Procedure: LOWER EXTREMITY VENOGRAPHY;  Surgeon: Algernon Huxley, MD;  Location: [place] [facility];  Service: Cardiovascular;  Laterality: Right;  . PERIPHERAL VASCULAR CATHETERIZATION Left 09/01/2015   Procedure: A/V Shuntogram/Fistulagram;  Surgeon: Katha Cabal, MD;  Location: [place] [facility];  Service: Cardiovascular;  Laterality: Left;  . PERIPHERAL VASCULAR CATHETERIZATION N/A 09/30/2015   Procedure: A/V Shuntogram/Fistulagram with perm cathether removal;  Surgeon: Algernon Huxley, MD;  Location: [place] [facility];  Service: Cardiovascular;  Laterality: N/A;  . PERIPHERAL VASCULAR CATHETERIZATION Left 09/30/2015   Procedure: A/V Shunt Intervention;  Surgeon: Algernon Huxley, MD;  Location: [place] [facility];  Service: Cardiovascular;  Laterality: Left;  . PERIPHERAL VASCULAR CATHETERIZATION Left 12/03/2015   Procedure: Thrombectomy;  Surgeon: Algernon Huxley, MD;  Location: [place] [facility];  Service: Cardiovascular;  Laterality: Left;  . PERIPHERAL VASCULAR CATHETERIZATION Left 01/28/2016   Procedure: Thrombectomy;  Surgeon: Algernon Huxley, MD;  Location: [place] [facility];  Service: Cardiovascular;  Laterality: Left;  . PERIPHERAL VASCULAR CATHETERIZATION N/A 01/28/2016   Procedure: A/V Shuntogram/Fistulagram;  Surgeon: Algernon Huxley, MD;  Location: [place] [facility];  Service: Cardiovascular;  Laterality: N/A;  . SKIN SPLIT GRAFT Right 05/24/2018   Procedure: SKIN GRAFT SPLIT THICKNESS ( RIGHT CALF);  Surgeon: Algernon Huxley, MD;  Location: [facility];  Service: Vascular;  Laterality: Right;  . UPPER EXTREMITY ANGIOGRAPHY  10/24/2018   Procedure: Upper Extremity Angiography;  Surgeon: Algernon Huxley, MD;  Location: [place] [facility];  Service: Cardiovascular;;  . UPPER EXTREMITY ANGIOGRAPHY Right 02/14/2019   Procedure: UPPER EXTREMITY ANGIOGRAPHY;  Surgeon: Algernon Huxley, MD;  Location: [place] [facility];  Service: Cardiovascular;  Laterality: Right;  . WOUND DEBRIDEMENT Right 04/11/2018   Procedure: DEBRIDEMENT WOUND calf muscle and skin;  Surgeon: Algernon Huxley, MD;  Location: [facility];  Service: Vascular;  Laterality: Right;    Family History  Problem Relation Age of Onset  . Hypertension Other   . Heart disease Other   . Diabetes Mother     Allergies  Allergen Reactions  . Shellfish Allergy Anaphylaxis    CBC Latest Ref Rng & Units 01/21/2020 10/30/2018 10/29/2018  WBC 4.0 - 10.5 K/uL 6.6 11.8(H) 13.4(H)  Hemoglobin 13.0 - 17.0 g/dL 11.1(L) 7.8(L) 7.4(L)  Hematocrit 39.0 - 52.0 % 36.3(L) 27.5(L) 26.1(L)  Platelets 150 - 400 K/uL 170 348 363      CMP     Component Value Date/Time   NA 145 01/21/2020 1056   NA 139 01/17/2014 0502   K 5.6 (H) 01/21/2020 1056   K 4.8 01/17/2014 0502   CL 106 01/21/2020 1056   CL 102 01/17/2014 0502   CO2 23 01/21/2020 1056   CO2 27 01/17/2014 0502   GLUCOSE 62 (L) 01/21/2020 1056   GLUCOSE 101 (H) 01/17/2014  0502   BUN 27 (H) 01/21/2020 1056   BUN 62 (H) 01/17/2014 0502   CREATININE 9.15 (H) 01/21/2020 1056   CREATININE 12.30 (H) 01/17/2014 0502   CALCIUM 9.8 01/21/2020 1056   CALCIUM 8.2 (L) 01/17/2014 0502   PROT 6.8 10/22/2018 1948   PROT 7.9 06/20/2011 2221   ALBUMIN  2.5 (L) 10/30/2018 1041   ALBUMIN 3.1 (L) 01/16/2014 1336   AST 14 (L) 10/22/2018 1948   AST 33 06/20/2011 2221   ALT 7 10/22/2018 1948   ALT 22 06/20/2011 2221   ALKPHOS 73 10/22/2018 1948   ALKPHOS 48 (L) 06/20/2011 2221   BILITOT 0.5 10/22/2018 1948   BILITOT 0.3 06/20/2011 2221   GFRNONAA 5 (L) 01/21/2020 1056   GFRNONAA 4 (L) 01/17/2014 0502   GFRAA 6 (L) 01/21/2020 1056   GFRAA 4 (L) 01/17/2014 0502     No results found.     Assessment & Plan:   1. ESRD (end stage renal disease) ([place]) Recommend:  The patient is experiencing increasing problems with their dialysis access.  Patient should have a right upper extremity angiogram with the intention for intervention.  The intention for intervention is to restore appropriate flow and prevent thrombosis and possible loss of the access.  As well as improve the quality of dialysis therapy.  The risks, benefits and alternative therapies were reviewed in detail with the patient.  All questions were answered.  The patient agrees to proceed with angio/intervention.      2. Hypertension, unspecified type Continue antihypertensive medications as already ordered, these medications have been reviewed and there are no changes at this time.   3. Type 2 diabetes mellitus with other specified complication, unspecified whether long term insulin use ([place]) Continue hypoglycemic medications as already ordered, these medications have been reviewed and there are no changes at this time.  Hgb A1C to be monitored as already arranged by primary service   4. Hyperlipidemia, unspecified hyperlipidemia type Continue statin as ordered and reviewed, no changes at this  time    Current Outpatient Medications on File Prior to Visit  Medication Sig Dispense Refill  . allopurinol (ZYLOPRIM) 100 MG tablet Take 100 mg by mouth daily.    [name] amLODipine (NORVASC) 5 MG tablet Take 1 tablet (5 mg total) by mouth daily. 30 tablet 0  . atorvastatin (LIPITOR) 10 MG tablet Take 1 tablet by mouth daily.    . clonazePAM (KLONOPIN) 0.5 MG tablet Take 1 tablet (0.5 mg total) by mouth as needed. 10 tablet 0  . clopidogrel (PLAVIX) 75 MG tablet Take 1 tablet (75 mg total) by mouth daily. 30 tablet 11  . folic acid-vitamin b complex-vitamin c-selenium-zinc (DIALYVITE) 3 MG TABS tablet Take 1 tablet by mouth daily.     [name] gabapentin (NEURONTIN) 100 MG capsule Take 1 capsule (100 mg total) by mouth at bedtime. (Patient taking differently: Take 100 mg by mouth 2 (two) times daily. )    . hydrALAZINE (APRESOLINE) 25 MG tablet Take 25 mg by mouth 2 (two) times daily.    [name] HYDROcodone-acetaminophen (NORCO) 5-325 MG tablet Take 1 tablet by mouth every 6 (six) hours as needed for moderate pain. 20 tablet 0  . losartan (COZAAR) 100 MG tablet Take 100 mg by mouth daily.    . metoprolol tartrate (LOPRESSOR) 25 MG tablet Take 25 mg by mouth every evening.    . Multiple Vitamin (MULTIVITAMIN) LIQD Take 5 mLs by mouth daily.    . sevelamer carbonate (RENVELA) 800 MG tablet Take 2,400 mg by mouth 3 (three) times daily with meals.     [name] aspirin EC 81 MG tablet Take 1 tablet (81 mg total) by mouth daily. (Patient not taking: Reported on 01/21/2020) 150 tablet 2   No current facility-administered medications on file prior to visit.    There are no Patient  Instructions on file for this visit. No follow-ups on file.   Kris Hartmann, NP

## 2020-04-20 ENCOUNTER — Ambulatory Visit
Admission: RE | Admit: 2020-04-20 | Discharge: 2020-04-20 | Disposition: A | Discharge status: Home / Self Care | Payer: Medicare Other | Attending: Vascular Surgery | Admitting: Vascular Surgery

## 2020-04-20 ENCOUNTER — Other Ambulatory Visit (INDEPENDENT_AMBULATORY_CARE_PROVIDER_SITE_OTHER): Payer: Self-pay | Admitting: Nurse Practitioner

## 2020-04-20 ENCOUNTER — Encounter: Payer: Self-pay | Admitting: Vascular Surgery

## 2020-04-20 ENCOUNTER — Encounter
Admission: RE | Disposition: A | Discharge status: Home / Self Care | Payer: Self-pay | Source: Home / Self Care | Attending: Vascular Surgery

## 2020-04-20 ENCOUNTER — Other Ambulatory Visit: Payer: Self-pay

## 2020-04-20 DIAGNOSIS — Z79899 Other long term (current) drug therapy: Secondary | ICD-10-CM | POA: Diagnosis not present

## 2020-04-20 DIAGNOSIS — I509 Heart failure, unspecified: Secondary | ICD-10-CM | POA: Diagnosis not present

## 2020-04-20 DIAGNOSIS — Z95828 Presence of other vascular implants and grafts: Secondary | ICD-10-CM | POA: Insufficient documentation

## 2020-04-20 DIAGNOSIS — Z992 Dependence on renal dialysis: Secondary | ICD-10-CM

## 2020-04-20 DIAGNOSIS — Z87891 Personal history of nicotine dependence: Secondary | ICD-10-CM | POA: Diagnosis not present

## 2020-04-20 DIAGNOSIS — I7789 Other specified disorders of arteries and arterioles: Secondary | ICD-10-CM

## 2020-04-20 DIAGNOSIS — Z89611 Acquired absence of right leg above knee: Secondary | ICD-10-CM | POA: Insufficient documentation

## 2020-04-20 DIAGNOSIS — N186 End stage renal disease: Secondary | ICD-10-CM | POA: Insufficient documentation

## 2020-04-20 DIAGNOSIS — Z7902 Long term (current) use of antithrombotics/antiplatelets: Secondary | ICD-10-CM | POA: Diagnosis not present

## 2020-04-20 DIAGNOSIS — T82898A Other specified complication of vascular prosthetic devices, implants and grafts, initial encounter: Secondary | ICD-10-CM

## 2020-04-20 DIAGNOSIS — Y832 Surgical operation with anastomosis, bypass or graft as the cause of abnormal reaction of the patient, or of later complication, without mention of misadventure at the time of the procedure: Secondary | ICD-10-CM | POA: Diagnosis not present

## 2020-04-20 DIAGNOSIS — I132 Hypertensive heart and chronic kidney disease with heart failure and with stage 5 chronic kidney disease, or end stage renal disease: Secondary | ICD-10-CM | POA: Insufficient documentation

## 2020-04-20 DIAGNOSIS — Z7982 Long term (current) use of aspirin: Secondary | ICD-10-CM | POA: Insufficient documentation

## 2020-04-20 DIAGNOSIS — E785 Hyperlipidemia, unspecified: Secondary | ICD-10-CM | POA: Insufficient documentation

## 2020-04-20 DIAGNOSIS — E1122 Type 2 diabetes mellitus with diabetic chronic kidney disease: Secondary | ICD-10-CM | POA: Insufficient documentation

## 2020-04-20 HISTORY — PX: UPPER EXTREMITY ANGIOGRAPHY: CATH118270

## 2020-04-20 LAB — POTASSIUM (ARMC VASCULAR LAB ONLY): Potassium (ARMC vascular lab): 4 (ref 3.5–5.1)

## 2020-04-20 SURGERY — UPPER EXTREMITY ANGIOGRAPHY
Anesthesia: Moderate Sedation | Laterality: Right

## 2020-04-20 MED ORDER — FAMOTIDINE 20 MG PO TABS: 40.0000 mg | ORAL_TABLET | Freq: Once | ORAL | Status: DC | PRN

## 2020-04-20 MED ORDER — HYDROMORPHONE HCL 1 MG/ML IJ SOLN: 1.0000 mg | Freq: Once | INTRAMUSCULAR | Status: DC | PRN

## 2020-04-20 MED ORDER — HEPARIN SODIUM (PORCINE) 1000 UNIT/ML IJ SOLN
INTRAMUSCULAR | Status: AC
  Filled 2020-04-20: qty 1

## 2020-04-20 MED ORDER — HYDRALAZINE HCL 20 MG/ML IJ SOLN
INTRAMUSCULAR | Status: AC
  Filled 2020-04-20: qty 1

## 2020-04-20 MED ORDER — HYDRALAZINE HCL 20 MG/ML IJ SOLN
INTRAMUSCULAR | Status: DC | PRN
  Administered 2020-04-20: 20 mg via INTRAVENOUS

## 2020-04-20 MED ORDER — CEFAZOLIN SODIUM-DEXTROSE 1-4 GM/50ML-% IV SOLN
1.0000 g | Freq: Once | INTRAVENOUS | Status: AC
  Administered 2020-04-20: 1 g via INTRAVENOUS

## 2020-04-20 MED ORDER — METHYLPREDNISOLONE SODIUM SUCC 125 MG IJ SOLR
INTRAMUSCULAR | Status: AC
  Filled 2020-04-20: qty 2

## 2020-04-20 MED ORDER — MIDAZOLAM HCL 2 MG/2ML IJ SOLN
INTRAMUSCULAR | Status: DC | PRN
  Administered 2020-04-20: 2 mg via INTRAVENOUS

## 2020-04-20 MED ORDER — MIDAZOLAM HCL 5 MG/5ML IJ SOLN
INTRAMUSCULAR | Status: AC
  Filled 2020-04-20: qty 5

## 2020-04-20 MED ORDER — FENTANYL CITRATE (PF) 100 MCG/2ML IJ SOLN
INTRAMUSCULAR | Status: DC | PRN
  Administered 2020-04-20: 50 ug via INTRAVENOUS

## 2020-04-20 MED ORDER — FENTANYL CITRATE (PF) 100 MCG/2ML IJ SOLN
INTRAMUSCULAR | Status: AC
  Filled 2020-04-20: qty 2

## 2020-04-20 MED ORDER — DIPHENHYDRAMINE HCL 50 MG/ML IJ SOLN
INTRAMUSCULAR | Status: AC
  Filled 2020-04-20: qty 1

## 2020-04-20 MED ORDER — SODIUM CHLORIDE 0.9 % IV SOLN: INTRAVENOUS | Status: DC

## 2020-04-20 MED ORDER — LABETALOL HCL 5 MG/ML IV SOLN
INTRAVENOUS | Status: DC | PRN
  Administered 2020-04-20: 20 mg via INTRAVENOUS

## 2020-04-20 MED ORDER — LABETALOL HCL 5 MG/ML IV SOLN: 20.0000 mg | Freq: Once | INTRAVENOUS | Status: DC

## 2020-04-20 MED ORDER — LABETALOL HCL 5 MG/ML IV SOLN
INTRAVENOUS | Status: AC
  Filled 2020-04-20: qty 4

## 2020-04-20 MED ORDER — IODIXANOL 320 MG/ML IV SOLN
INTRAVENOUS | Status: DC | PRN
  Administered 2020-04-20: 60 mL

## 2020-04-20 MED ORDER — ONDANSETRON HCL 4 MG/2ML IJ SOLN: 4.0000 mg | Freq: Four times a day (QID) | INTRAMUSCULAR | Status: DC | PRN

## 2020-04-20 MED ORDER — MIDAZOLAM HCL 2 MG/ML PO SYRP: 8.0000 mg | ORAL_SOLUTION | Freq: Once | ORAL | Status: DC | PRN

## 2020-04-20 MED ORDER — DIPHENHYDRAMINE HCL 50 MG/ML IJ SOLN
50.0000 mg | Freq: Once | INTRAMUSCULAR | Status: AC | PRN
  Administered 2020-04-20: 50 mg via INTRAVENOUS

## 2020-04-20 MED ORDER — ASPIRIN EC 81 MG PO TBEC: 81.0000 mg | DELAYED_RELEASE_TABLET | Freq: Every day | ORAL | 2 refills | Status: DC

## 2020-04-20 MED ORDER — METHYLPREDNISOLONE SODIUM SUCC 125 MG IJ SOLR
125.0000 mg | Freq: Once | INTRAMUSCULAR | Status: AC | PRN
  Administered 2020-04-20: 125 mg via INTRAVENOUS

## 2020-04-20 MED ORDER — HEPARIN SODIUM (PORCINE) 1000 UNIT/ML IJ SOLN
INTRAMUSCULAR | Status: DC | PRN
  Administered 2020-04-20: 4000 [IU] via INTRAVENOUS

## 2020-04-20 SURGICAL SUPPLY — 26 items
BALLN LUTONIX 018 6X80X130 (BALLOONS) ×2
BALLN LUTONIX 7X80X130 (BALLOONS) ×4
BALLN ULTRVRSE 2.5X220X150 (BALLOONS) ×2
BALLOON LUTONIX 018 6X80X130 (BALLOONS) ×1 IMPLANT
BALLOON LUTONIX 7X80X130 (BALLOONS) ×2 IMPLANT
BALLOON ULTRVRSE 2.5X220X150 (BALLOONS) ×1 IMPLANT
CATH ANGIO 5F 100CM .035 PIG (CATHETERS) ×2 IMPLANT
CATH BEACON 5 .035 100 H1 TIP (CATHETERS) ×2 IMPLANT
CATH CXI SUPP ANG 4FR 135 (CATHETERS) ×1 IMPLANT
CATH CXI SUPP ANG 4FR 135CM (CATHETERS) ×2
DEVICE STARCLOSE SE CLOSURE (Vascular Products) ×2 IMPLANT
DEVICE TORQUE .025-.038 (MISCELLANEOUS) ×2 IMPLANT
GLIDEWIRE ANGLED SS 035X260CM (WIRE) ×2 IMPLANT
GUIDEWIRE SUPER STIFF .035X180 (WIRE) ×2 IMPLANT
KIT ENCORE 26 ADVANTAGE (KITS) ×2 IMPLANT
PACK ANGIOGRAPHY (CUSTOM PROCEDURE TRAY) ×2 IMPLANT
SHEATH BRITE TIP 4FRX11 (SHEATH) ×2 IMPLANT
SHEATH BRITE TIP 5FRX11 (SHEATH) ×2 IMPLANT
SHEATH SHUTTLE 6FR (SHEATH) ×2 IMPLANT
STENT LIFESTENT 5F 7X60X135 (Permanent Stent) ×2 IMPLANT
SYR MEDRAD MARK 7 150ML (SYRINGE) ×2 IMPLANT
TUBING CONTRAST HIGH PRESS 72 (TUBING) ×2 IMPLANT
VALVE CHECKFLO PERFORMER (SHEATH) ×2 IMPLANT
WIRE G V18X300CM (WIRE) ×2 IMPLANT
WIRE GUIDERIGHT .035X150 (WIRE) ×2 IMPLANT
WIRE MAGIC TORQUE 315CM (WIRE) ×2 IMPLANT

## 2020-04-20 NOTE — Discharge Instructions (Signed)
Femoral Site Care This sheet gives you information about how to care for yourself after your procedure. Your health care provider may also give you more specific instructions. If you have problems or questions, contact your health care provider. What can I expect after the procedure? After the procedure, it is common to have:  Bruising that usually fades within 1-2 weeks.  Tenderness at the site. Follow these instructions at home: Wound care  Follow instructions from your health care provider about how to take care of your insertion site. Make sure you: ? Wash your hands with soap and water before you change your bandage (dressing). If soap and water are not available, use hand sanitizer. ? Change your dressing as told by your health care provider. ? Leave stitches (sutures), skin glue, or adhesive strips in place. These skin closures may need to stay in place for 2 weeks or longer. If adhesive strip edges start to loosen and curl up, you may trim the loose edges. Do not remove adhesive strips completely unless your health care provider tells you to do that.  Do not take baths, swim, or use a hot tub until your health care provider approves.  You may shower 24-48 hours after the procedure or as told by your health care provider. ? Gently wash the site with plain soap and water. ? Pat the area dry with a clean towel. ? Do not rub the site. This may cause bleeding.  Do not apply powder or lotion to the site. Keep the site clean and dry.  Check your femoral site every day for signs of infection. Check for: ? Redness, swelling, or pain. ? Fluid or blood. ? Warmth. ? Pus or a bad smell. Activity  For the first 2-3 days after your procedure, or as long as directed: ? Avoid climbing stairs as much as possible. ? Do not squat.  Do not lift anything that is heavier than 10 lb (4.5 kg), or the limit that you are told, until your health care provider says that it is safe.  Rest as  directed. ? Avoid sitting for a long time without moving. Get up to take short walks every 1-2 hours.  Do not drive for 24 hours if you were given a medicine to help you relax (sedative). General instructions  Take over-the-counter and prescription medicines only as told by your health care provider.  Keep all follow-up visits as told by your health care provider. This is important. Contact a health care provider if you have:  A fever or chills.  You have redness, swelling, or pain around your insertion site. Get help right away if:  The catheter insertion area swells very fast.  You pass out.  You suddenly start to sweat or your skin gets clammy.  The catheter insertion area is bleeding, and the bleeding does not stop when you hold steady pressure on the area.  The area near or just beyond the catheter insertion site becomes pale, cool, tingly, or numb. These symptoms may represent a serious problem that is an emergency. Do not wait to see if the symptoms will go away. Get medical help right away. Call your local emergency services (911 in the [place] Do not drive yourself to the hospital. Summary  After the procedure, it is common to have bruising that usually fades within 1-2 weeks.  Check your femoral site every day for signs of infection.  Do not lift anything that is heavier than 10 lb (4.5 kg), or the   limit that you are told, until your health care provider says that it is safe. This information is not intended to replace advice given to you by your health care provider. Make sure you discuss any questions you have with your health care provider. Document Revised: 05/08/2017 Document Reviewed: 05/08/2017 Elsevier Patient Education  2020 [employer] Angiogram, Care After This sheet gives you information about how to care for yourself after your procedure. Your doctor may also give you more specific instructions. If you have problems or questions, contact your  doctor. Follow these instructions at home: Insertion site care  Follow instructions from your doctor about how to take care of your long, thin tube (catheter) insertion area. Make sure you: ? Wash your hands with soap and water before you change your bandage (dressing). If you cannot use soap and water, use hand sanitizer. ? Change your bandage as told by your doctor. ? Leave stitches (sutures), skin glue, or skin tape (adhesive) strips in place. They may need to stay in place for 2 weeks or longer. If tape strips get loose and curl up, you may trim the loose edges. Do not remove tape strips completely unless your doctor says it is okay.  Do not take baths, swim, or use a hot tub until your doctor says it is okay.  You may shower 24-48 hours after the procedure or as told by your doctor. ? Gently wash the area with plain soap and water. ? Pat the area dry with a clean towel. ? Do not rub the area. This may cause bleeding.  Do not apply powder or lotion to the area. Keep the area clean and dry.  Check your insertion area every day for signs of infection. Check for: ? More redness, swelling, or pain. ? Fluid or blood. ? Warmth. ? Pus or a bad smell. Activity  Rest as told by your doctor, usually for 1-2 days.  Do not lift anything that is heavier than 10 lbs. (4.5 kg) or as told by your doctor.  Do not drive for 24 hours if you were given a medicine to help you relax (sedative).  Do not drive or use heavy machinery while taking prescription pain medicine. General instructions   Go back to your normal activities as told by your doctor, usually in about a week. Ask your doctor what activities are safe for you.  If the insertion area starts to bleed, lie flat and put pressure on the area. If the bleeding does not stop, get help right away. This is an emergency.  Drink enough fluid to keep your pee (urine) clear or pale yellow.  Take over-the-counter and prescription medicines only  as told by your doctor.  Keep all follow-up visits as told by your doctor. This is important. Contact a doctor if:  You have a fever.  You have chills.  You have more redness, swelling, or pain around your insertion area.  You have fluid or blood coming from your insertion area.  The insertion area feels warm to the touch.  You have pus or a bad smell coming from your insertion area.  You have more bruising around the insertion area.  Blood collects in the tissue around the insertion area (hematoma) that may be painful to the touch. Get help right away if:  You have a lot of pain in the insertion area.  The insertion area swells very fast.  The insertion area is bleeding, and the bleeding does not stop after holding steady   pressure on the area.  The area near or just beyond the insertion area becomes pale, cool, tingly, or numb. These symptoms may be an emergency. Do not wait to see if the symptoms will go away. Get medical help right away. Call your local emergency services (911 in the [place] Do not drive yourself to the hospital. Summary  After the procedure, it is common to have bruising and tenderness at the long, thin tube insertion area.  After the procedure, it is important to rest and drink plenty of fluids.  Do not take baths, swim, or use a hot tub until your doctor says it is okay to do so. You may shower 24-48 hours after the procedure or as told by your doctor.  If the insertion area starts to bleed, lie flat and put pressure on the area. If the bleeding does not stop, get help right away. This is an emergency. This information is not intended to replace advice given to you by your health care provider. Make sure you discuss any questions you have with your health care provider. Document Revised: 04/07/2017 Document Reviewed: 04/19/2016 Elsevier Patient Education  2020 [employer].

## 2020-04-20 NOTE — Op Note (Signed)
OPERATIVE REPORT     PREOPERATIVE DIAGNOSIS: 1. End-stage renal disease. 2. 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   POSTOPERATIVE DIAGNOSIS: Same as above   PROCEDURE PERFORMED: 1. Ultrasound guidance vascular access to right femoral artery. 2. Catheter placement to right radial artery and right ulnar arteries     from right femoral approach. 3. Thoracic aortogram and selective right upper extremity angiogram     including selective images of the radial and ulnar arteries. 4.  Percutaneous transluminal angioplasty of the right subclavian artery with 7 mm diameter by 8 cm length Lutonix drug-coated angioplasty balloon 5.  Percutaneous transluminal angioplasty of right ulnar artery with 2.5 mm diameter by 22 cm length angioplasty balloon 6.  Percutaneous transluminal angioplasty of right brachial artery with 6 mm diameter by 8 cm length Lutonix drug-coated angioplasty balloon 7.  Stent placement to the right brachial artery with 7 mm diameter by 6 cm length life stent 8. StarClose closure device right femoral artery.   SURGEON:  Algernon Huxley, MD   ANESTHESIA:  Local with moderate conscious sedation for 64 minutes using 2 mg of Versed and 50 mcg of Fentanyl   BLOOD LOSS:  Minimal.   FLUOROSCOPY TIME: 9.7 minutes   INDICATION FOR PROCEDURE:  This is a 69 y.o.male who presented to our office with steal syndrome.  The patient's right brachial artery to axillary vein is working well, but their hand is numb and painful.  To further evaluate this to determine what options would be possible to treat the steal syndrome, angiogram of the left upper extremity is indicated.  Risks and benefits are discussed.  Informed consent was obtained.   DESCRIPTION OF PROCEDURE:  The patient was brought to the vascular suite.  Moderate conscious sedation was administered during a face to face encounter with the patient throughout the procedure with my  supervision of the RN administering medicines and monitoring the patient's vital signs, pulse oximetry, telemetry and mental status throughout from the start of the procedure until the patient was taken to the recovery room.  Groins were shaved and prepped and sterile surgical field was created.  The right femoral head was localized with fluoroscopy and the right femoral artery was then visualized with ultrasound and found to be widely patent.  It was then accessed under direct ultrasound guidance without difficulty with a Seldinger needle and a permanent image was recorded.  A J-wire and 5-French sheath were then placed.  Pigtail catheter was placed into the ascending aorta and a thoracic aortogram was then performed in the LAO projection. This demonstrated normal origins to the great vessels without significant proximal stenoses and a normal configuration of the great vessels.  The patient was given 4000 units of intravenous heparin and a Headhunter catheter was used to selectively cannulate the innominate artery and then the right subclavian artery without difficulty.  This was then sequentially advanced to the brachial artery and to the brachial bifurcation.  Steal was demonstrated with all of the flow from the artery going into the fistula and not downstream to the hand on images with the catheter proximal to the access. I then advanced a catheter beyond the access and into the radial and ulnar arteries.  The radial artery was entered first, but the catheter only went to the very origin of the radial artery, then exchanged for a CXI catheter to evaluate more distally in the radial artery.  This showed essentially all the flow  going retrograde with occlusion of the distal radial artery.  After this, the catheter was removed and placed into the ulnar artery, this was evaluated.  There was an occlusion in the right ulnar artery in the proximal to mid segment with reconstitution of the mid segment  although it became diseased and small distally at the wrist.  Findings in the right upper extremity showed extensive disease with a greater than 80% stenosis in the right mid brachial artery and about a 65 to 70% stenosis in the right subclavian artery in the mid to distal segment of the subclavian.  There were other areas of more mild disease and was extremely calcific throughout, but these were the other 2 proximal stenoses before the fistula.  At this point, multiple areas were going to be treated to try to improve the flow to the hand.  I started by treating the most proximal lesion in the right subclavian artery.  This was in the mid to distal subclavian artery well beyond the vertebral origin.  Treated with a 7 mm diameter by 8 cm length Lutonix drug-coated angioplasty balloon inflated to 10 atm for 1 minute.  Completion imaging showed a significant improvement with only about a 15 to 20% residual stenosis.  I then advanced this balloon down into the ulnar artery to exchange for a 0.018 wire.  The initial balloon was removed.  I then used a 2.5 mm diameter by 22 cm length angioplasty balloon from the wrist to the proximal ulnar artery.  Completion imaging following this showed the area of occlusion in the proximal to mid ulnar artery to be now widely patent with no significant residual stenosis although the ulnar artery did still occlude in the hand and wrist area.  I then addressed the mid brachial lesion with a 6 mm diameter by 8 cm length Lutonix drug-coated angioplasty balloon inflated to 10 atm for 1 minute.  Completion imaging showed a greater than 50% residual stenosis, we selected a 7 mm diameter by 6 cm length life stent and postdilated this with a 7 mm balloon with about a 10 to 15% residual stenosis.  This point, imaging still showed the majority of the flow going out the graft and not into the hand, but there is really nothing further that I could do from an endovascular standpoint.   The  diagnostic catheter was removed.  Oblique arteriogram was performed of the right femoral artery and StarClose closure device deployed in the usual fashion with excellent hemostatic result. The patient tolerated the procedure well and was taken to the recovery room in stable condition.    Leotis Pain 04/20/2020 1:48 PM

## 2020-04-20 NOTE — Interval H&P Note (Signed)
History and Physical Interval Note:  04/20/2020 10:49 AM  Johnny [name].  has presented today for surgery, with the diagnosis of RT Upper Extremity Angiography   Steal Syndrome   Pt to have Covid test on 12-9.  The various methods of treatment have been discussed with the patient and family. After consideration of risks, benefits and other options for treatment, the patient has consented to  Procedure(s): UPPER EXTREMITY ANGIOGRAPHY (Right) as a surgical intervention.  The patient's history has been reviewed, patient examined, no change in status, stable for surgery.  I have reviewed the patient's chart and labs.  Questions were answered to the patient's satisfaction.     Leotis Pain

## 2020-04-21 ENCOUNTER — Encounter: Payer: Self-pay | Admitting: Vascular Surgery

## 2020-04-21 ENCOUNTER — Ambulatory Visit: Payer: Medicare Other | Attending: Nurse Practitioner

## 2020-04-23 ENCOUNTER — Ambulatory Visit: Payer: Medicare Other

## 2020-04-28 ENCOUNTER — Ambulatory Visit: Payer: Medicare Other

## 2020-04-30 ENCOUNTER — Ambulatory Visit: Payer: Medicare Other

## 2020-05-05 ENCOUNTER — Ambulatory Visit: Payer: Medicare Other

## 2020-05-05 ENCOUNTER — Other Ambulatory Visit (INDEPENDENT_AMBULATORY_CARE_PROVIDER_SITE_OTHER): Payer: Self-pay | Admitting: Nurse Practitioner

## 2020-05-05 ENCOUNTER — Telehealth (INDEPENDENT_AMBULATORY_CARE_PROVIDER_SITE_OTHER): Payer: Self-pay

## 2020-05-05 MED ORDER — HYDROCODONE-ACETAMINOPHEN 5-325 MG PO TABS: 1.0000 | ORAL_TABLET | Freq: Four times a day (QID) | ORAL | 0 refills | Status: DC | PRN

## 2020-05-05 NOTE — Telephone Encounter (Signed)
Ms Johnny [name] has been made aware rx was sent

## 2020-05-05 NOTE — Telephone Encounter (Signed)
sent 

## 2020-05-07 ENCOUNTER — Ambulatory Visit: Payer: Medicare Other

## 2020-05-07 ENCOUNTER — Encounter (INDEPENDENT_AMBULATORY_CARE_PROVIDER_SITE_OTHER): Payer: Medicare Other

## 2020-05-07 ENCOUNTER — Ambulatory Visit (INDEPENDENT_AMBULATORY_CARE_PROVIDER_SITE_OTHER): Payer: Medicare Other | Admitting: Nurse Practitioner

## 2020-05-12 ENCOUNTER — Other Ambulatory Visit: Payer: Self-pay

## 2020-05-12 ENCOUNTER — Ambulatory Visit: Payer: Medicare Other

## 2020-05-12 ENCOUNTER — Encounter (INDEPENDENT_AMBULATORY_CARE_PROVIDER_SITE_OTHER): Payer: Self-pay | Admitting: Vascular Surgery

## 2020-05-12 ENCOUNTER — Ambulatory Visit (INDEPENDENT_AMBULATORY_CARE_PROVIDER_SITE_OTHER): Payer: Medicare Other | Admitting: Vascular Surgery

## 2020-05-12 VITALS — BP 62/38 | HR 80 | Resp 16 | Ht 68.0 in | Wt 168.0 lb

## 2020-05-12 DIAGNOSIS — E7800 Pure hypercholesterolemia, unspecified: Secondary | ICD-10-CM | POA: Diagnosis not present

## 2020-05-12 DIAGNOSIS — S78111A Complete traumatic amputation at level between right hip and knee, initial encounter: Secondary | ICD-10-CM

## 2020-05-12 DIAGNOSIS — I7025 Atherosclerosis of native arteries of other extremities with ulceration: Secondary | ICD-10-CM | POA: Diagnosis not present

## 2020-05-12 DIAGNOSIS — N186 End stage renal disease: Secondary | ICD-10-CM

## 2020-05-12 NOTE — Progress Notes (Signed)
MRN : [id number]  Johnny [name]. is a 70 y.o. ([birth year]) male who presents with chief complaint of  Chief Complaint  Patient presents with  . Follow-up    Post angio  .  History of Present Illness: Patient returns today in follow up of his dialysis access and his steal syndrome in his left hand.  Even after a 3 level intervention on his right arm a few weeks ago, his steal symptoms in the right hand remain persistent and severe.  He has pain with dialysis which is severe and debilitating.  He has pain and weakness in his hand all the time although it worsens with dialysis.  He has very limited access options but he has severe vascular disease in the upper extremity.  He has already used up his reasonable options on the left arm.  Current Outpatient Medications  Medication Sig Dispense Refill  . allopurinol (ZYLOPRIM) 100 MG tablet Take 100 mg by mouth daily.    [name] amLODipine (NORVASC) 5 MG tablet Take 1 tablet (5 mg total) by mouth daily. 30 tablet 0  . aspirin EC 81 MG tablet Take 1 tablet (81 mg total) by mouth daily. 150 tablet 2  . atorvastatin (LIPITOR) 10 MG tablet Take 1 tablet by mouth daily.    . clonazePAM (KLONOPIN) 0.5 MG tablet Take 1 tablet (0.5 mg total) by mouth as needed. 10 tablet 0  . clopidogrel (PLAVIX) 75 MG tablet Take 1 tablet (75 mg total) by mouth daily. 30 tablet 11  . folic acid-vitamin b complex-vitamin c-selenium-zinc (DIALYVITE) 3 MG TABS tablet Take 1 tablet by mouth daily.     [name] gabapentin (NEURONTIN) 100 MG capsule Take 1 capsule (100 mg total) by mouth at bedtime. (Patient taking differently: Take 100 mg by mouth 2 (two) times daily.)    . hydrALAZINE (APRESOLINE) 25 MG tablet Take 25 mg by mouth 2 (two) times daily.    [name] HYDROcodone-acetaminophen (NORCO) 5-325 MG tablet Take 1 tablet by mouth every 6 (six) hours as needed for moderate pain. 30 tablet 0  . losartan (COZAAR) 100 MG tablet Take 100 mg by mouth daily.    . metoprolol tartrate  (LOPRESSOR) 25 MG tablet Take 25 mg by mouth every evening.    . Multiple Vitamin (MULTIVITAMIN) LIQD Take 5 mLs by mouth daily.    . sevelamer carbonate (RENVELA) 800 MG tablet Take 2,400 mg by mouth 3 (three) times daily with meals.     . SPS 15 GM/60ML suspension Take by mouth.     No current facility-administered medications for this visit.    Past Medical History:  Diagnosis Date  . Anemia   . Anxiety   . CHF (congestive heart failure) ([place])   . Chronic kidney disease    esrd dialysis m/w/f  . Gout   . Hyperlipidemia   . Hypertension   . Myocardial infarction ([place]) 2010   10 years ago  . 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  . Peripheral vascular disease [facility])     Past Surgical History:  Procedure Laterality Date  . A/V FISTULAGRAM Right 09/06/2018   Procedure: A/V FISTULAGRAM;  Surgeon: Algernon Huxley, MD;  Location: [place] [facility];  Service: Cardiovascular;  Laterality: Right;  . A/V SHUNTOGRAM Left 06/21/2017   Procedure: A/V SHUNTOGRAM;  Surgeon: Katha Cabal, MD;  Location: [place] [facility];  Service: Cardiovascular;  Laterality: Left;  . A/V SHUNTOGRAM N/A 10/24/2018   Procedure:  A/V SHUNTOGRAM;  Surgeon: Algernon Huxley, MD;  Location: [place] [facility];  Service: Cardiovascular;  Laterality: N/A;  . ABOVE KNEE LEG AMPUTATION Right 2020  . AMPUTATION Right 10/25/2018   Procedure: AMPUTATION ABOVE KNEE;  Surgeon: Algernon Huxley, MD;  Location: [facility];  Service: General;  Laterality: Right;  . APPLICATION OF WOUND VAC Right 04/11/2018   Procedure: APPLICATION OF WOUND VAC;  Surgeon: Algernon Huxley, MD;  Location: [facility];  Service: Vascular;  Laterality: Right;  . AV FISTULA PLACEMENT Left 09/18/2015   Procedure: INSERTION OF ARTERIOVENOUS (AV) GORE-TEX GRAFT ARM ( BRACH/AXILLARY GRAFT W/ INSTANT STICK GRAFT );  Surgeon: Katha Cabal, MD;  Location: [facility];  Service: Vascular;  Laterality: Left;  . AV FISTULA  PLACEMENT Right 07/19/2018   Procedure: INSERTION OF ARTERIOVENOUS (AV) GORE-TEX GRAFT ARM ( BRACHIAL AXILLARY);  Surgeon: Algernon Huxley, MD;  Location: [facility];  Service: Vascular;  Laterality: Right;  . DIALYSIS FISTULA CREATION Right 10/2017   right chest perm cath  . DIALYSIS/PERMA CATHETER REMOVAL N/A 09/13/2018   Procedure: DIALYSIS/PERMA CATHETER REMOVAL;  Surgeon: Algernon Huxley, MD;  Location: [place] [facility];  Service: Cardiovascular;  Laterality: N/A;  . ESOPHAGOGASTRODUODENOSCOPY N/A 12/19/2017   Procedure: ESOPHAGOGASTRODUODENOSCOPY (EGD);  Surgeon: Lin Landsman, MD;  Location: [facility];  Service: Gastroenterology;  Laterality: N/A;  . LOWER EXTREMITY ANGIOGRAPHY Left 11/16/2017   Procedure: LOWER EXTREMITY ANGIOGRAPHY;  Surgeon: Algernon Huxley, MD;  Location: [place] [facility];  Service: Cardiovascular;  Laterality: Left;  . LOWER EXTREMITY ANGIOGRAPHY Right 01/18/2018   Procedure: LOWER EXTREMITY ANGIOGRAPHY;  Surgeon: Algernon Huxley, MD;  Location: [place] [facility];  Service: Cardiovascular;  Laterality: Right;  . LOWER EXTREMITY ANGIOGRAPHY Left 04/02/2018   Procedure: LOWER EXTREMITY ANGIOGRAPHY;  Surgeon: Algernon Huxley, MD;  Location: [place] [facility];  Service: Cardiovascular;  Laterality: Left;  . LOWER EXTREMITY ANGIOGRAPHY Right 04/09/2018   Procedure: Lower Extremity Angiography with possible intervention;  Surgeon: Algernon Huxley, MD;  Location: [place] [facility];  Service: Cardiovascular;  Laterality: Right;  . LOWER EXTREMITY ANGIOGRAPHY Right 07/23/2018   Procedure: Lower Extremity Angiography;  Surgeon: Algernon Huxley, MD;  Location: [place] [facility];  Service: Cardiovascular;  Laterality: Right;  . LOWER EXTREMITY ANGIOGRAPHY Right 09/13/2018   Procedure: LOWER EXTREMITY ANGIOGRAPHY;  Surgeon: Algernon Huxley, MD;  Location: [place] [facility];  Service: Cardiovascular;  Laterality: Right;  . LOWER EXTREMITY VENOGRAPHY Right 09/13/2018   Procedure: LOWER  EXTREMITY VENOGRAPHY;  Surgeon: Algernon Huxley, MD;  Location: [place] [facility];  Service: Cardiovascular;  Laterality: Right;  . PERIPHERAL VASCULAR CATHETERIZATION Left 09/01/2015   Procedure: A/V Shuntogram/Fistulagram;  Surgeon: Katha Cabal, MD;  Location: [place] [facility];  Service: Cardiovascular;  Laterality: Left;  . PERIPHERAL VASCULAR CATHETERIZATION N/A 09/30/2015   Procedure: A/V Shuntogram/Fistulagram with perm cathether removal;  Surgeon: Algernon Huxley, MD;  Location: [place] [facility];  Service: Cardiovascular;  Laterality: N/A;  . PERIPHERAL VASCULAR CATHETERIZATION Left 09/30/2015   Procedure: A/V Shunt Intervention;  Surgeon: Algernon Huxley, MD;  Location: [place] [facility];  Service: Cardiovascular;  Laterality: Left;  . PERIPHERAL VASCULAR CATHETERIZATION Left 12/03/2015   Procedure: Thrombectomy;  Surgeon: Algernon Huxley, MD;  Location: [place] [facility];  Service: Cardiovascular;  Laterality: Left;  . PERIPHERAL VASCULAR CATHETERIZATION Left 01/28/2016   Procedure: Thrombectomy;  Surgeon: Algernon Huxley, MD;  Location: [place]  [facility];  Service: Cardiovascular;  Laterality: Left;  . PERIPHERAL VASCULAR CATHETERIZATION N/A 01/28/2016   Procedure: A/V Shuntogram/Fistulagram;  Surgeon: Algernon Huxley, MD;  Location: [place] [facility];  Service: Cardiovascular;  Laterality: N/A;  . SKIN SPLIT GRAFT Right 05/24/2018   Procedure: SKIN GRAFT SPLIT THICKNESS ( RIGHT CALF);  Surgeon: Algernon Huxley, MD;  Location: [facility];  Service: Vascular;  Laterality: Right;  . UPPER EXTREMITY ANGIOGRAPHY  10/24/2018   Procedure: Upper Extremity Angiography;  Surgeon: Algernon Huxley, MD;  Location: [place] [facility];  Service: Cardiovascular;;  . UPPER EXTREMITY ANGIOGRAPHY Right 02/14/2019   Procedure: UPPER EXTREMITY ANGIOGRAPHY;  Surgeon: Algernon Huxley, MD;  Location: [place] [facility];  Service: Cardiovascular;  Laterality: Right;  . UPPER EXTREMITY ANGIOGRAPHY Right 04/20/2020    Procedure: UPPER EXTREMITY ANGIOGRAPHY;  Surgeon: Algernon Huxley, MD;  Location: [place] [facility];  Service: Cardiovascular;  Laterality: Right;  . WOUND DEBRIDEMENT Right 04/11/2018   Procedure: DEBRIDEMENT WOUND calf muscle and skin;  Surgeon: Algernon Huxley, MD;  Location: [facility];  Service: Vascular;  Laterality: Right;     Social History   Tobacco Use  . Smoking status: Former Smoker    Types: Cigarettes    Quit date: 05/17/2005    Years since quitting: 14.9  . Smokeless tobacco: Never Used  Vaping Use  . Vaping Use: Never used  Substance Use Topics  . Alcohol use: No  . Drug use: No      Family History  Problem Relation Age of Onset  . Hypertension Other   . Heart disease Other   . Diabetes Mother   no bleeding or clotting disorders  Allergies  Allergen Reactions  . Shellfish Allergy Anaphylaxis     REVIEW OF SYSTEMS (Negative unless checked)  Constitutional: [] Weight loss  [] Fever  [] Chills Cardiac: [] Chest pain   [] Chest pressure   [] Palpitations   [] Shortness of breath when laying flat   [x] Shortness of breath at rest   [] Shortness of breath with exertion. Vascular:  [] Pain in legs with walking   [] Pain in legs at rest   [] Pain in legs when laying flat   [] Claudication   [] Pain in feet when walking  [] Pain in feet at rest  [] Pain in feet when laying flat   [] History of DVT   [] Phlebitis   [] Swelling in legs   [] Varicose veins   [] Non-healing ulcers Pulmonary:   [] Uses home oxygen   [] Productive cough   [] Hemoptysis   [] Wheeze  [] COPD   [] Asthma Neurologic:  [] Dizziness  [] Blackouts   [] Seizures   [] History of stroke   [] History of TIA  [] Aphasia   [] Temporary blindness   [] Dysphagia   [] Weakness or numbness in arms   [] Weakness or numbness in legs Musculoskeletal:  [x] Arthritis   [] Joint swelling   [x] Joint pain   [] Low back pain Hematologic:  [] Easy bruising  [] Easy bleeding   [] Hypercoagulable state   [x] Anemic   Gastrointestinal:  [] Blood in stool   [] Vomiting  blood  [] Gastroesophageal reflux/heartburn   [] Abdominal pain Genitourinary:  [x] Chronic kidney disease   [] Difficult urination  [] Frequent urination  [] Burning with urination   [] Hematuria Skin:  [] Rashes   [x] Ulcers   [x] Wounds Psychological:  [x] History of anxiety   []  History of major depression.  Physical Examination  BP (!) 62/38 (BP Location: Right Arm)   Pulse 80   Resp 16   Ht 5\' 8"  (1.727 m)   Wt 168 lb (76.2  kg)   BMI 25.54 kg/m  Gen:  WD/WN, NAD Head: [place]/AT, No temporalis wasting. Ear/Nose/Throat: Hearing grossly intact, nares w/o erythema or drainage Eyes: Conjunctiva clear. Sclera non-icteric Neck: Supple.  Trachea midline Pulmonary:  Good air movement, no use of accessory muscles.  Cardiac: Irregular Vascular: Good thrill in right upper arm AV graft Vessel Right Left  Radial Not Palpable 1+ Palpable               Musculoskeletal: M/S 5/5 throughout.  Right AKA.  Hand is somewhat cool and slightly swollen on the right Neurologic: Sensation grossly intact in extremities.  Symmetrical.   Psychiatric: Judgment intact, Mood & affect appropriate for pt's clinical situation. Dermatologic: No rashes or ulcers noted.  No cellulitis or open wounds.       Labs Recent Results (from the past 2160 hour(s))  SARS CORONAVIRUS 2 (TAT 6-24 HRS) Nasopharyngeal Nasopharyngeal Swab     Status: None   Collection Time: 04/16/20 11:19 AM   Specimen: Nasopharyngeal Swab  Result Value Ref Range   SARS Coronavirus 2 NEGATIVE NEGATIVE    Comment: (NOTE) SARS-CoV-2 target nucleic acids are NOT DETECTED.  The SARS-CoV-2 RNA is generally detectable in upper and lower respiratory specimens during the acute phase of infection. Negative results do not preclude SARS-CoV-2 infection, do not rule out co-infections with other pathogens, and should not be used as the sole basis for treatment or other patient management decisions. Negative results must be combined with clinical  observations, patient history, and epidemiological information. The expected result is Negative.  Fact Sheet for Patients: SugarRoll.be  Fact Sheet for Healthcare Providers: https://www.woods-mathews.com/  This test is not yet approved or cleared by the [place] FDA and  has been authorized for detection and/or diagnosis of SARS-CoV-2 by FDA under an Emergency Use Authorization (EUA). This EUA will remain  in effect (meaning this test can be used) for the duration of the COVID-19 declaration under Se ction 564(b)(1) of the Act, 21 U.S.C. section 360bbb-3(b)(1), unless the authorization is terminated or revoked sooner.  Performed at [facility], [place] [address], [place], [place] [address]   [place] [facility] vascular lab only)     Status: None   Collection Time: 04/20/20 12:00 PM  Result Value Ref Range   [place] [facility] vascular lab) 4.0 3.5 - 5.1    Comment: Performed at [facility], [address] [place], [place] [address]    Radiology PERIPHERAL VASCULAR CATHETERIZATION  Result Date: 04/20/2020 See op note  VAS [name] (AVF,AVG)  Result Date: 04/17/2020 DIALYSIS ACCESS Access Site: Right Upper Extremity. Access Type: Brachial Axillary AVG. Comparison Study: 02/06/2020 Performing Technologist: Almira Coaster RVS  Examination Guidelines: A complete evaluation includes B-mode imaging, spectral Doppler, color Doppler, and power Doppler as needed of all accessible portions of each vessel. Unilateral testing is considered an integral part of a complete examination. Limited examinations for reoccurring indications may be performed as noted.  Findings:   +--------------------+----------+-----------------+--------+ AVG                 PSV (cm/s)Flow Vol (mL/min)Describe +--------------------+----------+-----------------+--------+ Native artery inflow   181           903                 +--------------------+----------+-----------------+--------+ Arterial anastomosis   265                              +--------------------+----------+-----------------+--------+  Prox graft             294                              +--------------------+----------+-----------------+--------+ Mid graft              104                              +--------------------+----------+-----------------+--------+ Distal graft           213                              +--------------------+----------+-----------------+--------+ Venous anastomosis      52                              +--------------------+----------+-----------------+--------+ Venous outflow          40                              +--------------------+----------+-----------------+--------+ +--------------+-------------+---------+---------+----------+------------------+               Diameter (cm)  Depth  BranchingPSV (cm/s)   Flow Volume                                  (cm)                           (ml/min)      +--------------+-------------+---------+---------+----------+------------------+ Rt Rad Art                                       9                        Dist                                                                      +--------------+-------------+---------+---------+----------+------------------+ Rt Rad Art                                       10                       Prox                                                                      +--------------+-------------+---------+---------+----------+------------------+  Summary: The Right Brachial Axillary AVG appears to be patent throughout; Flow Voume appears to be normal. The Right Radial Artery  has diminished flow 10cm/s in Proximal segment and 9cm/s in the distal segment. The Right Axilaary Artery appears to display Hetergeneous, Calcified plaque. Decreased Velocities in the Radial Artery could account  for coldness and numbness in Right Hand.  *See table(s) above for measurements and observations.  Diagnosing physician: Leotis Pain MD Electronically signed by Leotis Pain MD on 04/17/2020 at 10:58:45 AM.    --------------------------------------------------------------------------------   Final     Assessment/Plan  End stage renal disease [facility]) The patient has an extremely difficult access problem.  He has very limited access options and his blood pressure is extremely low keeping an access open has been difficult in the past.  He now has a functional access, but has resulted in significant steal syndrome of the right hand and his right hand is ischemic despite arterial intervention.  His vascular disease is very extensive and I do not think a DRRL procedure has much likelihood of success in the right arm.  That leaves [name] with 3 options.  One is leaving it as is and expecting his right hand to be amputated.  One is to ligate the graft and place a catheter.  The third option would be a banding of the graft which may result in failure of the graft but at least gives [name] a chance of success before condemning him to likely lifetime catheter dependent dialysis.  We will plan on a banding of the graft in the near future at his convenience with the knowledge that this may result in failure of the graft need for a PermCath.  Above knee amputation of right lower extremity ([place]) Doing well from this and has a prosthesis.  He had many revascularizations and debridements before ultimately requiring an amputation.  Atherosclerosis of native arteries of the extremities with ulceration ([place]) Now status post right above-knee amputation  Hypercholesterolemia lipid control important in reducing the progression of atherosclerotic disease. Continue statin therapy     Leotis Pain, MD  05/12/2020 2:54 PM    This note was created with Dragon medical transcription system.  Any errors from dictation are purely  unintentional

## 2020-05-12 NOTE — Assessment & Plan Note (Signed)
Now status post right above-knee amputation

## 2020-05-12 NOTE — H&P (View-Only) (Signed)
MRN : [id number]  Johnny [name]. is a 70 y.o. (1950-09-16) male who presents with chief complaint of  Chief Complaint  Patient presents with  . Follow-up    Post angio  .  History of Present Illness: Patient returns today in follow up of his dialysis access and his steal syndrome in his left hand.  Even after a 3 level intervention on his right arm a few weeks ago, his steal symptoms in the right hand remain persistent and severe.  He has pain with dialysis which is severe and debilitating.  He has pain and weakness in his hand all the time although it worsens with dialysis.  He has very limited access options but he has severe vascular disease in the upper extremity.  He has already used up his reasonable options on the left arm.  Current Outpatient Medications  Medication Sig Dispense Refill  . allopurinol (ZYLOPRIM) 100 MG tablet Take 100 mg by mouth daily.    [name] amLODipine (NORVASC) 5 MG tablet Take 1 tablet (5 mg total) by mouth daily. 30 tablet 0  . aspirin EC 81 MG tablet Take 1 tablet (81 mg total) by mouth daily. 150 tablet 2  . atorvastatin (LIPITOR) 10 MG tablet Take 1 tablet by mouth daily.    . clonazePAM (KLONOPIN) 0.5 MG tablet Take 1 tablet (0.5 mg total) by mouth as needed. 10 tablet 0  . clopidogrel (PLAVIX) 75 MG tablet Take 1 tablet (75 mg total) by mouth daily. 30 tablet 11  . folic acid-vitamin b complex-vitamin c-selenium-zinc (DIALYVITE) 3 MG TABS tablet Take 1 tablet by mouth daily.     [name] gabapentin (NEURONTIN) 100 MG capsule Take 1 capsule (100 mg total) by mouth at bedtime. (Patient taking differently: Take 100 mg by mouth 2 (two) times daily.)    . hydrALAZINE (APRESOLINE) 25 MG tablet Take 25 mg by mouth 2 (two) times daily.    [name] HYDROcodone-acetaminophen (NORCO) 5-325 MG tablet Take 1 tablet by mouth every 6 (six) hours as needed for moderate pain. 30 tablet 0  . losartan (COZAAR) 100 MG tablet Take 100 mg by mouth daily.    . metoprolol tartrate  (LOPRESSOR) 25 MG tablet Take 25 mg by mouth every evening.    . Multiple Vitamin (MULTIVITAMIN) LIQD Take 5 mLs by mouth daily.    . sevelamer carbonate (RENVELA) 800 MG tablet Take 2,400 mg by mouth 3 (three) times daily with meals.     . SPS 15 GM/60ML suspension Take by mouth.     No current facility-administered medications for this visit.    Past Medical History:  Diagnosis Date  . Anemia   . Anxiety   . CHF (congestive heart failure) ([place])   . Chronic kidney disease    esrd dialysis m/w/f  . Gout   . Hyperlipidemia   . Hypertension   . Myocardial infarction ([place]) 2010   10 years ago  . 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  . Peripheral vascular disease [facility])     Past Surgical History:  Procedure Laterality Date  . A/V FISTULAGRAM Right 09/06/2018   Procedure: A/V FISTULAGRAM;  Surgeon: Algernon Huxley, MD;  Location: [place] [facility];  Service: Cardiovascular;  Laterality: Right;  . A/V SHUNTOGRAM Left 06/21/2017   Procedure: A/V SHUNTOGRAM;  Surgeon: Katha Cabal, MD;  Location: [place] [facility];  Service: Cardiovascular;  Laterality: Left;  . A/V SHUNTOGRAM N/A 10/24/2018   Procedure:  A/V SHUNTOGRAM;  Surgeon: Algernon Huxley, MD;  Location: [place] [facility];  Service: Cardiovascular;  Laterality: N/A;  . ABOVE KNEE LEG AMPUTATION Right 2020  . AMPUTATION Right 10/25/2018   Procedure: AMPUTATION ABOVE KNEE;  Surgeon: Algernon Huxley, MD;  Location: [facility];  Service: General;  Laterality: Right;  . APPLICATION OF WOUND VAC Right 04/11/2018   Procedure: APPLICATION OF WOUND VAC;  Surgeon: Algernon Huxley, MD;  Location: [facility];  Service: Vascular;  Laterality: Right;  . AV FISTULA PLACEMENT Left 09/18/2015   Procedure: INSERTION OF ARTERIOVENOUS (AV) GORE-TEX GRAFT ARM ( BRACH/AXILLARY GRAFT W/ INSTANT STICK GRAFT );  Surgeon: Katha Cabal, MD;  Location: [facility];  Service: Vascular;  Laterality: Left;  . AV FISTULA  PLACEMENT Right 07/19/2018   Procedure: INSERTION OF ARTERIOVENOUS (AV) GORE-TEX GRAFT ARM ( BRACHIAL AXILLARY);  Surgeon: Algernon Huxley, MD;  Location: [facility];  Service: Vascular;  Laterality: Right;  . DIALYSIS FISTULA CREATION Right 10/2017   right chest perm cath  . DIALYSIS/PERMA CATHETER REMOVAL N/A 09/13/2018   Procedure: DIALYSIS/PERMA CATHETER REMOVAL;  Surgeon: Algernon Huxley, MD;  Location: [place] [facility];  Service: Cardiovascular;  Laterality: N/A;  . ESOPHAGOGASTRODUODENOSCOPY N/A 12/19/2017   Procedure: ESOPHAGOGASTRODUODENOSCOPY (EGD);  Surgeon: Lin Landsman, MD;  Location: [facility];  Service: Gastroenterology;  Laterality: N/A;  . LOWER EXTREMITY ANGIOGRAPHY Left 11/16/2017   Procedure: LOWER EXTREMITY ANGIOGRAPHY;  Surgeon: Algernon Huxley, MD;  Location: [place] [facility];  Service: Cardiovascular;  Laterality: Left;  . LOWER EXTREMITY ANGIOGRAPHY Right 01/18/2018   Procedure: LOWER EXTREMITY ANGIOGRAPHY;  Surgeon: Algernon Huxley, MD;  Location: [place] [facility];  Service: Cardiovascular;  Laterality: Right;  . LOWER EXTREMITY ANGIOGRAPHY Left 04/02/2018   Procedure: LOWER EXTREMITY ANGIOGRAPHY;  Surgeon: Algernon Huxley, MD;  Location: [place] [facility];  Service: Cardiovascular;  Laterality: Left;  . LOWER EXTREMITY ANGIOGRAPHY Right 04/09/2018   Procedure: Lower Extremity Angiography with possible intervention;  Surgeon: Algernon Huxley, MD;  Location: [place] [facility];  Service: Cardiovascular;  Laterality: Right;  . LOWER EXTREMITY ANGIOGRAPHY Right 07/23/2018   Procedure: Lower Extremity Angiography;  Surgeon: Algernon Huxley, MD;  Location: [place] [facility];  Service: Cardiovascular;  Laterality: Right;  . LOWER EXTREMITY ANGIOGRAPHY Right 09/13/2018   Procedure: LOWER EXTREMITY ANGIOGRAPHY;  Surgeon: Algernon Huxley, MD;  Location: [place] [facility];  Service: Cardiovascular;  Laterality: Right;  . LOWER EXTREMITY VENOGRAPHY Right 09/13/2018   Procedure: LOWER  EXTREMITY VENOGRAPHY;  Surgeon: Algernon Huxley, MD;  Location: [place] [facility];  Service: Cardiovascular;  Laterality: Right;  . PERIPHERAL VASCULAR CATHETERIZATION Left 09/01/2015   Procedure: A/V Shuntogram/Fistulagram;  Surgeon: Katha Cabal, MD;  Location: [place] [facility];  Service: Cardiovascular;  Laterality: Left;  . PERIPHERAL VASCULAR CATHETERIZATION N/A 09/30/2015   Procedure: A/V Shuntogram/Fistulagram with perm cathether removal;  Surgeon: Algernon Huxley, MD;  Location: [place] [facility];  Service: Cardiovascular;  Laterality: N/A;  . PERIPHERAL VASCULAR CATHETERIZATION Left 09/30/2015   Procedure: A/V Shunt Intervention;  Surgeon: Algernon Huxley, MD;  Location: [place] [facility];  Service: Cardiovascular;  Laterality: Left;  . PERIPHERAL VASCULAR CATHETERIZATION Left 12/03/2015   Procedure: Thrombectomy;  Surgeon: Algernon Huxley, MD;  Location: [place] [facility];  Service: Cardiovascular;  Laterality: Left;  . PERIPHERAL VASCULAR CATHETERIZATION Left 01/28/2016   Procedure: Thrombectomy;  Surgeon: Algernon Huxley, MD;  Location: [place]  [facility];  Service: Cardiovascular;  Laterality: Left;  . PERIPHERAL VASCULAR CATHETERIZATION N/A 01/28/2016   Procedure: A/V Shuntogram/Fistulagram;  Surgeon: Algernon Huxley, MD;  Location: [place] [facility];  Service: Cardiovascular;  Laterality: N/A;  . SKIN SPLIT GRAFT Right 05/24/2018   Procedure: SKIN GRAFT SPLIT THICKNESS ( RIGHT CALF);  Surgeon: Algernon Huxley, MD;  Location: [facility];  Service: Vascular;  Laterality: Right;  . UPPER EXTREMITY ANGIOGRAPHY  10/24/2018   Procedure: Upper Extremity Angiography;  Surgeon: Algernon Huxley, MD;  Location: [place] [facility];  Service: Cardiovascular;;  . UPPER EXTREMITY ANGIOGRAPHY Right 02/14/2019   Procedure: UPPER EXTREMITY ANGIOGRAPHY;  Surgeon: Algernon Huxley, MD;  Location: [place] [facility];  Service: Cardiovascular;  Laterality: Right;  . UPPER EXTREMITY ANGIOGRAPHY Right 04/20/2020    Procedure: UPPER EXTREMITY ANGIOGRAPHY;  Surgeon: Algernon Huxley, MD;  Location: [place] [facility];  Service: Cardiovascular;  Laterality: Right;  . WOUND DEBRIDEMENT Right 04/11/2018   Procedure: DEBRIDEMENT WOUND calf muscle and skin;  Surgeon: Algernon Huxley, MD;  Location: [facility];  Service: Vascular;  Laterality: Right;     Social History   Tobacco Use  . Smoking status: Former Smoker    Types: Cigarettes    Quit date: 05/17/2005    Years since quitting: 14.9  . Smokeless tobacco: Never Used  Vaping Use  . Vaping Use: Never used  Substance Use Topics  . Alcohol use: No  . Drug use: No      Family History  Problem Relation Age of Onset  . Hypertension Other   . Heart disease Other   . Diabetes Mother   no bleeding or clotting disorders  Allergies  Allergen Reactions  . Shellfish Allergy Anaphylaxis     REVIEW OF SYSTEMS (Negative unless checked)  Constitutional: [] Weight loss  [] Fever  [] Chills Cardiac: [] Chest pain   [] Chest pressure   [] Palpitations   [] Shortness of breath when laying flat   [x] Shortness of breath at rest   [] Shortness of breath with exertion. Vascular:  [] Pain in legs with walking   [] Pain in legs at rest   [] Pain in legs when laying flat   [] Claudication   [] Pain in feet when walking  [] Pain in feet at rest  [] Pain in feet when laying flat   [] History of DVT   [] Phlebitis   [] Swelling in legs   [] Varicose veins   [] Non-healing ulcers Pulmonary:   [] Uses home oxygen   [] Productive cough   [] Hemoptysis   [] Wheeze  [] COPD   [] Asthma Neurologic:  [] Dizziness  [] Blackouts   [] Seizures   [] History of stroke   [] History of TIA  [] Aphasia   [] Temporary blindness   [] Dysphagia   [] Weakness or numbness in arms   [] Weakness or numbness in legs Musculoskeletal:  [x] Arthritis   [] Joint swelling   [x] Joint pain   [] Low back pain Hematologic:  [] Easy bruising  [] Easy bleeding   [] Hypercoagulable state   [x] Anemic   Gastrointestinal:  [] Blood in stool   [] Vomiting  blood  [] Gastroesophageal reflux/heartburn   [] Abdominal pain Genitourinary:  [x] Chronic kidney disease   [] Difficult urination  [] Frequent urination  [] Burning with urination   [] Hematuria Skin:  [] Rashes   [x] Ulcers   [x] Wounds Psychological:  [x] History of anxiety   []  History of major depression.  Physical Examination  BP (!) 62/38 (BP Location: Right Arm)   Pulse 80   Resp 16   Ht 5\' 8"  (1.727 m)   Wt 168 lb (76.2  kg)   BMI 25.54 kg/m  Gen:  WD/WN, NAD Head: [place]/AT, No temporalis wasting. Ear/Nose/Throat: Hearing grossly intact, nares w/o erythema or drainage Eyes: Conjunctiva clear. Sclera non-icteric Neck: Supple.  Trachea midline Pulmonary:  Good air movement, no use of accessory muscles.  Cardiac: Irregular Vascular: Good thrill in right upper arm AV graft Vessel Right Left  Radial Not Palpable 1+ Palpable               Musculoskeletal: M/S 5/5 throughout.  Right AKA.  Hand is somewhat cool and slightly swollen on the right Neurologic: Sensation grossly intact in extremities.  Symmetrical.   Psychiatric: Judgment intact, Mood & affect appropriate for pt's clinical situation. Dermatologic: No rashes or ulcers noted.  No cellulitis or open wounds.       Labs Recent Results (from the past 2160 hour(s))  SARS CORONAVIRUS 2 (TAT 6-24 HRS) Nasopharyngeal Nasopharyngeal Swab     Status: None   Collection Time: 04/16/20 11:19 AM   Specimen: Nasopharyngeal Swab  Result Value Ref Range   SARS Coronavirus 2 NEGATIVE NEGATIVE    Comment: (NOTE) SARS-CoV-2 target nucleic acids are NOT DETECTED.  The SARS-CoV-2 RNA is generally detectable in upper and lower respiratory specimens during the acute phase of infection. Negative results do not preclude SARS-CoV-2 infection, do not rule out co-infections with other pathogens, and should not be used as the sole basis for treatment or other patient management decisions. Negative results must be combined with clinical  observations, patient history, and epidemiological information. The expected result is Negative.  Fact Sheet for Patients: SugarRoll.be  Fact Sheet for Healthcare Providers: https://www.woods-mathews.com/  This test is not yet approved or cleared by the [place] FDA and  has been authorized for detection and/or diagnosis of SARS-CoV-2 by FDA under an Emergency Use Authorization (EUA). This EUA will remain  in effect (meaning this test can be used) for the duration of the COVID-19 declaration under Se ction 564(b)(1) of the Act, 21 U.S.C. section 360bbb-3(b)(1), unless the authorization is terminated or revoked sooner.  Performed at [facility], [place] [address], [place], [place] [address]   Potassium [facility] vascular lab only)     Status: None   Collection Time: 04/20/20 12:00 PM  Result Value Ref Range   Pot[facility] vascular lab) 4.0 3.5 - 5.1    Comment: Performed at [facility], [address] [place], [place] [address]    Radiology PERIPHERAL VASCULAR CATHETERIZATION  Result Date: 04/20/2020 See op note  VAS [name] (AVF,AVG)  Result Date: 04/17/2020 DIALYSIS ACCESS Access Site: Right Upper Extremity. Access Type: Brachial Axillary AVG. Comparison Study: 02/06/2020 Performing Technologist: Almira Coaster RVS  Examination Guidelines: A complete evaluation includes B-mode imaging, spectral Doppler, color Doppler, and power Doppler as needed of all accessible portions of each vessel. Unilateral testing is considered an integral part of a complete examination. Limited examinations for reoccurring indications may be performed as noted.  Findings:   +--------------------+----------+-----------------+--------+ AVG                 PSV (cm/s)Flow Vol (mL/min)Describe +--------------------+----------+-----------------+--------+ Native artery inflow   181           903                 +--------------------+----------+-----------------+--------+ Arterial anastomosis   265                              +--------------------+----------+-----------------+--------+  Prox graft             294                              +--------------------+----------+-----------------+--------+ Mid graft              104                              +--------------------+----------+-----------------+--------+ Distal graft           213                              +--------------------+----------+-----------------+--------+ Venous anastomosis      52                              +--------------------+----------+-----------------+--------+ Venous outflow          40                              +--------------------+----------+-----------------+--------+ +--------------+-------------+---------+---------+----------+------------------+               Diameter (cm)  Depth  BranchingPSV (cm/s)   Flow Volume                                  (cm)                           (ml/min)      +--------------+-------------+---------+---------+----------+------------------+ Rt Rad Art                                       9                        Dist                                                                      +--------------+-------------+---------+---------+----------+------------------+ Rt Rad Art                                       10                       Prox                                                                      +--------------+-------------+---------+---------+----------+------------------+  Summary: The Right Brachial Axillary AVG appears to be patent throughout; Flow Voume appears to be normal. The Right Radial Artery  has diminished flow 10cm/s in Proximal segment and 9cm/s in the distal segment. The Right Axilaary Artery appears to display Hetergeneous, Calcified plaque. Decreased Velocities in the Radial Artery could account  for coldness and numbness in Right Hand.  *See table(s) above for measurements and observations.  Diagnosing physician: Leotis Pain MD Electronically signed by Leotis Pain MD on 04/17/2020 at 10:58:45 AM.    --------------------------------------------------------------------------------   Final     Assessment/Plan  End stage renal disease [facility]) The patient has an extremely difficult access problem.  He has very limited access options and his blood pressure is extremely low keeping an access open has been difficult in the past.  He now has a functional access, but has resulted in significant steal syndrome of the right hand and his right hand is ischemic despite arterial intervention.  His vascular disease is very extensive and I do not think a DRRL procedure has much likelihood of success in the right arm.  That leaves [name] with 3 options.  One is leaving it as is and expecting his right hand to be amputated.  One is to ligate the graft and place a catheter.  The third option would be a banding of the graft which may result in failure of the graft but at least gives [name] a chance of success before condemning him to likely lifetime catheter dependent dialysis.  We will plan on a banding of the graft in the near future at his convenience with the knowledge that this may result in failure of the graft need for a PermCath.  Above knee amputation of right lower extremity ([place]) Doing well from this and has a prosthesis.  He had many revascularizations and debridements before ultimately requiring an amputation.  Atherosclerosis of native arteries of the extremities with ulceration ([name]) Now status post right above-knee amputation  Hypercholesterolemia lipid control important in reducing the progression of atherosclerotic disease. Continue statin therapy     Leotis Pain, MD  05/12/2020 2:54 PM    This note was created with Dragon medical transcription system.  Any errors from dictation are purely  unintentional

## 2020-05-12 NOTE — Assessment & Plan Note (Signed)
The patient has an extremely difficult access problem.  He has very limited access options and his blood pressure is extremely low keeping an access open has been difficult in the past.  He now has a functional access, but has resulted in significant steal syndrome of the right hand and his right hand is ischemic despite arterial intervention.  His vascular disease is very extensive and I do not think a DRRL procedure has much likelihood of success in the right arm.  That leaves [name] with 3 options.  One is leaving it as is and expecting his right hand to be amputated.  One is to ligate the graft and place a catheter.  The third option would be a banding of the graft which may result in failure of the graft but at least gives [name] a chance of success before condemning him to likely lifetime catheter dependent dialysis.  We will plan on a banding of the graft in the near future at his convenience with the knowledge that this may result in failure of the graft need for a PermCath.

## 2020-05-12 NOTE — Assessment & Plan Note (Signed)
lipid control important in reducing the progression of atherosclerotic disease. Continue statin therapy  

## 2020-05-12 NOTE — Assessment & Plan Note (Signed)
Doing well from this and has a prosthesis.  He had many revascularizations and debridements before ultimately requiring an amputation.

## 2020-05-13 ENCOUNTER — Telehealth (INDEPENDENT_AMBULATORY_CARE_PROVIDER_SITE_OTHER): Payer: Self-pay

## 2020-05-13 NOTE — Telephone Encounter (Signed)
Spoke with the patient's caregiver to schedule the patient for his banding surgery with Dr. Lucky Cowboy. Patient is scheduled with Dr. Lucky Cowboy for his surgery on 05/21/20, pre-op phone call on 05/19/20 between 8-1 pm and covid testing on 05/19/20 between 8-1 pm at the [facility]. Pre-surgical instructions will be mailed. Spoke with the patient's caregiver.

## 2020-05-14 ENCOUNTER — Ambulatory Visit: Payer: Medicare Other

## 2020-05-15 ENCOUNTER — Other Ambulatory Visit (INDEPENDENT_AMBULATORY_CARE_PROVIDER_SITE_OTHER): Payer: Self-pay | Admitting: Nurse Practitioner

## 2020-05-18 ENCOUNTER — Other Ambulatory Visit (INDEPENDENT_AMBULATORY_CARE_PROVIDER_SITE_OTHER): Payer: Self-pay | Admitting: Nurse Practitioner

## 2020-05-18 NOTE — Telephone Encounter (Signed)
Is this ok to refill?  

## 2020-05-19 ENCOUNTER — Other Ambulatory Visit
Admission: RE | Admit: 2020-05-19 | Discharge: 2020-05-19 | Disposition: A | Discharge status: Home / Self Care | Payer: Medicare Other | Source: Ambulatory Visit | Attending: Vascular Surgery | Admitting: Vascular Surgery

## 2020-05-19 ENCOUNTER — Inpatient Hospital Stay: Admission: RE | Admit: 2020-05-19 | Payer: Medicare Other | Source: Ambulatory Visit

## 2020-05-19 ENCOUNTER — Other Ambulatory Visit: Payer: Self-pay

## 2020-05-19 ENCOUNTER — Ambulatory Visit: Payer: Medicare Other

## 2020-05-19 DIAGNOSIS — Z20822 Contact with and (suspected) exposure to covid-19: Secondary | ICD-10-CM | POA: Diagnosis not present

## 2020-05-19 DIAGNOSIS — Z01818 Encounter for other preprocedural examination: Secondary | ICD-10-CM | POA: Insufficient documentation

## 2020-05-19 LAB — CBC WITH DIFFERENTIAL/PLATELET
Abs Immature Granulocytes: 0.01 10*3/uL (ref 0.00–0.07)
Basophils Absolute: 0.1 10*3/uL (ref 0.0–0.1)
Basophils Relative: 1 %
Eosinophils Absolute: 0.2 10*3/uL (ref 0.0–0.5)
Eosinophils Relative: 3 %
HCT: 30.7 % — ABNORMAL LOW (ref 39.0–52.0)
Hemoglobin: 9.7 g/dL — ABNORMAL LOW (ref 13.0–17.0)
Immature Granulocytes: 0 %
Lymphocytes Relative: 26 %
Lymphs Abs: 1.7 10*3/uL (ref 0.7–4.0)
MCH: 31.2 pg (ref 26.0–34.0)
MCHC: 31.6 g/dL (ref 30.0–36.0)
MCV: 98.7 fL (ref 80.0–100.0)
Monocytes Absolute: 0.7 10*3/uL (ref 0.1–1.0)
Monocytes Relative: 10 %
Neutro Abs: 3.9 10*3/uL (ref 1.7–7.7)
Neutrophils Relative %: 60 %
Platelets: 179 10*3/uL (ref 150–400)
RBC: 3.11 MIL/uL — ABNORMAL LOW (ref 4.22–5.81)
RDW: 13.7 % (ref 11.5–15.5)
WBC: 6.5 10*3/uL (ref 4.0–10.5)
nRBC: 0 % (ref 0.0–0.2)

## 2020-05-19 LAB — BASIC METABOLIC PANEL
Anion gap: 11 (ref 5–15)
BUN: 19 mg/dL (ref 8–23)
CO2: 25 mmol/L (ref 22–32)
Calcium: 10.2 mg/dL (ref 8.9–10.3)
Chloride: 100 mmol/L (ref 98–111)
Creatinine, Ser: 8.13 mg/dL — ABNORMAL HIGH (ref 0.61–1.24)
GFR, Estimated: 7 mL/min — ABNORMAL LOW (ref >60)
Glucose, Bld: 77 mg/dL (ref 70–99)
Potassium: 4.1 mmol/L (ref 3.5–5.1)
Sodium: 136 mmol/L (ref 135–145)

## 2020-05-19 LAB — TYPE AND SCREEN
ABO/RH(D): B NEG
Antibody Screen: NEGATIVE

## 2020-05-19 LAB — SARS CORONAVIRUS 2 (TAT 6-24 HRS): SARS Coronavirus 2: NEGATIVE

## 2020-05-19 LAB — PROTIME-INR
INR: 1 (ref 0.8–1.2)
Prothrombin Time: 13.2 seconds (ref 11.4–15.2)

## 2020-05-19 LAB — APTT: aPTT: 31 seconds (ref 24–36)

## 2020-05-19 NOTE — Patient Instructions (Addendum)
Your procedure is scheduled on: 05/21/20 Report to [facility]. To find out your arrival time please call [phone] between 1PM - 3PM on 05/20/20.  Remember: Instructions that are not followed completely may result in serious medical risk, up to and including death, or upon the discretion of your surgeon and anesthesiologist your surgery may need to be rescheduled.     _X__ 1. Do not eat food or drink any liquids after midnight the night before your procedure.                 You may have a sip or two of water with your medications the morning of surgery  __X__2.  On the morning of surgery brush your teeth with toothpaste and water, you                 may rinse your mouth with mouthwash if you wish.  Do not swallow any              toothpaste of mouthwash.     _X__ 3.  No Alcohol for 24 hours before or after surgery.   _X__ 4.  Do Not Smoke or use e-cigarettes For 24 Hours Prior to Your Surgery.                 Do not use any chewable tobacco products for at least 6 hours prior to                 surgery.  ____  5.  Bring all medications with you on the day of surgery if instructed.   __X__  6.  Notify your doctor if there is any change in your medical condition      (cold, fever, infections).     Do not wear jewelry, make-up, hairpins, clips or nail polish. Do not wear lotions, powders, or perfumes.  Do not shave 48 hours prior to surgery. Men may shave face and neck. Do not bring valuables to the hospital.    [facility] is not responsible for any belongings or valuables.  Contacts, dentures/partials or body piercings may not be worn into surgery. Bring a case for your contacts, glasses or hearing aids, a denture cup will be supplied. Leave your suitcase in the car. After surgery it may be brought to your room. For patients admitted to the hospital, discharge time is determined by your treatment team.   Patients discharged  the day of surgery will not be allowed to drive home.   Please read over the following fact sheets that you were given:   MRSA Information, CHG  __X__ Take these medicines the morning of surgery with A SIP OF WATER:    1. allopurinol (ZYLOPRIM) 100 MG tablet  2. amLODipine (NORVASC) 5 MG tablet  3. atorvastatin (LIPITOR) 10 MG tablet  4. gabapentin (NEURONTIN) 100 MG capsule  5. HYDROcodone-acetaminophen (NORCO/VICODIN) 5-325 MG tablet if needed  6.  ____ Fleet Enema (as directed)   __X__ Use CHG Soap/SAGE wipes as directed  ____ Use inhalers on the day of surgery  ____ Stop metformin/Janumet/Farxiga 2 days prior to surgery    ____ Take 1/2 of usual insulin dose the night before surgery. No insulin the morning          of surgery.   __X__ Stop Blood Thinners Coumadin/Plavix/Xarelto/Pleta/Pradaxa/Eliquis/Effient/Aspirin  on   Or contact your Surgeon, Cardiologist or Medical Doctor regarding  ability to stop your blood thinners  __X__ Stop Anti-inflammatories 7 days before surgery such as Advil, Ibuprofen, Motrin,  BC or Goodies Powder, Naprosyn, Naproxen, Aleve, Aspirin    __X__ Stop all herbal supplements, fish oil or vitamin E until after surgery.    ____ Bring C-Pap to the hospital.

## 2020-05-21 ENCOUNTER — Ambulatory Visit: Payer: Medicare Other | Admitting: Anesthesiology

## 2020-05-21 ENCOUNTER — Ambulatory Visit: Payer: Medicare Other

## 2020-05-21 ENCOUNTER — Ambulatory Visit
Admission: RE | Admit: 2020-05-21 | Discharge: 2020-05-21 | Disposition: A | Discharge status: Home / Self Care | Payer: Medicare Other | Attending: Vascular Surgery | Admitting: Vascular Surgery

## 2020-05-21 ENCOUNTER — Other Ambulatory Visit: Payer: Self-pay

## 2020-05-21 ENCOUNTER — Encounter
Admission: RE | Disposition: A | Discharge status: Home / Self Care | Payer: Self-pay | Source: Home / Self Care | Attending: Vascular Surgery

## 2020-05-21 ENCOUNTER — Encounter: Payer: Self-pay | Admitting: Vascular Surgery

## 2020-05-21 DIAGNOSIS — E7800 Pure hypercholesterolemia, unspecified: Secondary | ICD-10-CM | POA: Insufficient documentation

## 2020-05-21 DIAGNOSIS — Z87891 Personal history of nicotine dependence: Secondary | ICD-10-CM | POA: Diagnosis not present

## 2020-05-21 DIAGNOSIS — I120 Hypertensive chronic kidney disease with stage 5 chronic kidney disease or end stage renal disease: Secondary | ICD-10-CM | POA: Insufficient documentation

## 2020-05-21 DIAGNOSIS — E1122 Type 2 diabetes mellitus with diabetic chronic kidney disease: Secondary | ICD-10-CM | POA: Insufficient documentation

## 2020-05-21 DIAGNOSIS — Z7902 Long term (current) use of antithrombotics/antiplatelets: Secondary | ICD-10-CM | POA: Insufficient documentation

## 2020-05-21 DIAGNOSIS — N186 End stage renal disease: Secondary | ICD-10-CM | POA: Insufficient documentation

## 2020-05-21 DIAGNOSIS — T82898A Other specified complication of vascular prosthetic devices, implants and grafts, initial encounter: Secondary | ICD-10-CM | POA: Diagnosis not present

## 2020-05-21 DIAGNOSIS — Z89611 Acquired absence of right leg above knee: Secondary | ICD-10-CM | POA: Insufficient documentation

## 2020-05-21 DIAGNOSIS — Z992 Dependence on renal dialysis: Secondary | ICD-10-CM

## 2020-05-21 DIAGNOSIS — I70209 Unspecified atherosclerosis of native arteries of extremities, unspecified extremity: Secondary | ICD-10-CM | POA: Insufficient documentation

## 2020-05-21 DIAGNOSIS — Z79899 Other long term (current) drug therapy: Secondary | ICD-10-CM | POA: Diagnosis not present

## 2020-05-21 DIAGNOSIS — Y832 Surgical operation with anastomosis, bypass or graft as the cause of abnormal reaction of the patient, or of later complication, without mention of misadventure at the time of the procedure: Secondary | ICD-10-CM | POA: Insufficient documentation

## 2020-05-21 DIAGNOSIS — Z7982 Long term (current) use of aspirin: Secondary | ICD-10-CM | POA: Diagnosis not present

## 2020-05-21 LAB — POCT I-STAT, CHEM 8
BUN: 22 mg/dL (ref 8–23)
Calcium, Ion: 1.08 mmol/L — ABNORMAL LOW (ref 1.15–1.40)
Chloride: 106 mmol/L (ref 98–111)
Creatinine, Ser: 8.9 mg/dL — ABNORMAL HIGH (ref 0.61–1.24)
Glucose, Bld: 75 mg/dL (ref 70–99)
HCT: 35 % — ABNORMAL LOW (ref 39.0–52.0)
Hemoglobin: 11.9 g/dL — ABNORMAL LOW (ref 13.0–17.0)
Potassium: 4.2 mmol/L (ref 3.5–5.1)
Sodium: 138 mmol/L (ref 135–145)
TCO2: 25 mmol/L (ref 22–32)

## 2020-05-21 SURGERY — BANDING HERO GRAFT
Anesthesia: General | Laterality: Right

## 2020-05-21 MED ORDER — LIDOCAINE HCL (PF) 2 % IJ SOLN
INTRAMUSCULAR | Status: AC
  Filled 2020-05-21: qty 5

## 2020-05-21 MED ORDER — HYDRALAZINE HCL 20 MG/ML IJ SOLN
20.0000 mg | Freq: Once | INTRAMUSCULAR | Status: AC
  Filled 2020-05-21: qty 1

## 2020-05-21 MED ORDER — PROPOFOL 10 MG/ML IV BOLUS
INTRAVENOUS | Status: DC | PRN
  Administered 2020-05-21: 80 mg via INTRAVENOUS

## 2020-05-21 MED ORDER — FAMOTIDINE 20 MG PO TABS
ORAL_TABLET | ORAL | Status: AC
  Administered 2020-05-21: 20 mg via ORAL
  Filled 2020-05-21: qty 1

## 2020-05-21 MED ORDER — CHLORHEXIDINE GLUCONATE CLOTH 2 % EX PADS: 6.0000 | MEDICATED_PAD | Freq: Once | CUTANEOUS | Status: DC

## 2020-05-21 MED ORDER — CHLORHEXIDINE GLUCONATE 0.12 % MT SOLN: 15.0000 mL | Freq: Once | OROMUCOSAL | Status: AC

## 2020-05-21 MED ORDER — ONDANSETRON HCL 4 MG/2ML IJ SOLN: 4.0000 mg | Freq: Four times a day (QID) | INTRAMUSCULAR | Status: DC | PRN

## 2020-05-21 MED ORDER — ONDANSETRON HCL 4 MG/2ML IJ SOLN
INTRAMUSCULAR | Status: DC | PRN
  Administered 2020-05-21: 4 mg via INTRAVENOUS

## 2020-05-21 MED ORDER — CEFAZOLIN SODIUM-DEXTROSE 1-4 GM/50ML-% IV SOLN
1.0000 g | INTRAVENOUS | Status: AC
  Administered 2020-05-21: 1 g via INTRAVENOUS

## 2020-05-21 MED ORDER — PHENYLEPHRINE HCL (PRESSORS) 10 MG/ML IV SOLN
INTRAVENOUS | Status: DC | PRN
  Administered 2020-05-21: 200 ug via INTRAVENOUS
  Administered 2020-05-21: 150 ug via INTRAVENOUS
  Administered 2020-05-21 (×2): 200 ug via INTRAVENOUS

## 2020-05-21 MED ORDER — ACETAMINOPHEN 10 MG/ML IV SOLN
INTRAVENOUS | Status: AC
  Filled 2020-05-21: qty 100

## 2020-05-21 MED ORDER — KETAMINE HCL 50 MG/5ML IJ SOSY
PREFILLED_SYRINGE | INTRAMUSCULAR | Status: AC
  Filled 2020-05-21: qty 5

## 2020-05-21 MED ORDER — DEXAMETHASONE SODIUM PHOSPHATE 10 MG/ML IJ SOLN
INTRAMUSCULAR | Status: DC | PRN
  Administered 2020-05-21: 5 mg via INTRAVENOUS

## 2020-05-21 MED ORDER — HYDRALAZINE HCL 20 MG/ML IJ SOLN
INTRAMUSCULAR | Status: AC
  Administered 2020-05-21: 20 mg via INTRAVENOUS
  Filled 2020-05-21: qty 1

## 2020-05-21 MED ORDER — LIDOCAINE HCL (CARDIAC) PF 100 MG/5ML IV SOSY
PREFILLED_SYRINGE | INTRAVENOUS | Status: DC | PRN
  Administered 2020-05-21: 100 mg via INTRAVENOUS

## 2020-05-21 MED ORDER — FENTANYL CITRATE (PF) 100 MCG/2ML IJ SOLN
INTRAMUSCULAR | Status: AC
  Filled 2020-05-21: qty 2

## 2020-05-21 MED ORDER — MIDAZOLAM HCL 2 MG/2ML IJ SOLN
INTRAMUSCULAR | Status: DC | PRN
  Administered 2020-05-21: .5 mg via INTRAVENOUS

## 2020-05-21 MED ORDER — CEFAZOLIN SODIUM-DEXTROSE 1-4 GM/50ML-% IV SOLN
INTRAVENOUS | Status: AC
  Filled 2020-05-21: qty 50

## 2020-05-21 MED ORDER — KETAMINE HCL 10 MG/ML IJ SOLN
INTRAMUSCULAR | Status: DC | PRN
  Administered 2020-05-21: 20 mg via INTRAVENOUS

## 2020-05-21 MED ORDER — MIDAZOLAM HCL 2 MG/2ML IJ SOLN
INTRAMUSCULAR | Status: AC
  Filled 2020-05-21: qty 2

## 2020-05-21 MED ORDER — ONDANSETRON HCL 4 MG/2ML IJ SOLN
INTRAMUSCULAR | Status: AC
  Filled 2020-05-21: qty 2

## 2020-05-21 MED ORDER — CHLORHEXIDINE GLUCONATE 0.12 % MT SOLN
OROMUCOSAL | Status: AC
  Administered 2020-05-21: 15 mL via OROMUCOSAL
  Filled 2020-05-21: qty 15

## 2020-05-21 MED ORDER — BUPIVACAINE-EPINEPHRINE (PF) 0.5% -1:200000 IJ SOLN
INTRAMUSCULAR | Status: DC | PRN
  Administered 2020-05-21: 4 mL via PERINEURAL

## 2020-05-21 MED ORDER — PROPOFOL 500 MG/50ML IV EMUL
INTRAVENOUS | Status: AC
  Filled 2020-05-21: qty 50

## 2020-05-21 MED ORDER — HYDROMORPHONE HCL 1 MG/ML IJ SOLN: 1.0000 mg | Freq: Once | INTRAMUSCULAR | Status: DC | PRN

## 2020-05-21 MED ORDER — ORAL CARE MOUTH RINSE: 15.0000 mL | Freq: Once | OROMUCOSAL | Status: AC

## 2020-05-21 MED ORDER — SODIUM CHLORIDE 0.9 % IV SOLN: INTRAVENOUS | Status: DC

## 2020-05-21 MED ORDER — ACETAMINOPHEN 10 MG/ML IV SOLN
INTRAVENOUS | Status: DC | PRN
  Administered 2020-05-21: 1000 mg via INTRAVENOUS

## 2020-05-21 MED ORDER — FENTANYL CITRATE (PF) 100 MCG/2ML IJ SOLN
INTRAMUSCULAR | Status: DC | PRN
  Administered 2020-05-21: 50 ug via INTRAVENOUS

## 2020-05-21 MED ORDER — DEXAMETHASONE SODIUM PHOSPHATE 10 MG/ML IJ SOLN
INTRAMUSCULAR | Status: AC
  Filled 2020-05-21: qty 1

## 2020-05-21 MED ORDER — HYDROCODONE-ACETAMINOPHEN 5-325 MG PO TABS: ORAL_TABLET | ORAL | 0 refills | Status: DC

## 2020-05-21 MED ORDER — FAMOTIDINE 20 MG PO TABS: 20.0000 mg | ORAL_TABLET | Freq: Once | ORAL | Status: AC

## 2020-05-21 SURGICAL SUPPLY — 38 items
ADH SKN CLS APL DERMABOND .7 (GAUZE/BANDAGES/DRESSINGS) ×1
APL PRP STRL LF DISP 70% ISPRP (MISCELLANEOUS) ×1
BLADE SURG SZ11 CARB STEEL (BLADE) ×2 IMPLANT
BOOT SUTURE AID YELLOW STND (SUTURE) ×2 IMPLANT
BRUSH SCRUB EZ  4% CHG (MISCELLANEOUS) ×1
BRUSH SCRUB EZ 4% CHG (MISCELLANEOUS) ×1 IMPLANT
CANISTER SUCT 1200ML W/VALVE (MISCELLANEOUS) ×2 IMPLANT
CHLORAPREP W/TINT 26 (MISCELLANEOUS) ×2 IMPLANT
COVER WAND RF STERILE (DRAPES) ×2 IMPLANT
DERMABOND ADVANCED (GAUZE/BANDAGES/DRESSINGS) ×1
DERMABOND ADVANCED .7 DNX12 (GAUZE/BANDAGES/DRESSINGS) ×1 IMPLANT
ELECT CAUTERY BLADE 6.4 (BLADE) ×2 IMPLANT
ELECT REM PT RETURN 9FT ADLT (ELECTROSURGICAL) ×2
ELECTRODE REM PT RTRN 9FT ADLT (ELECTROSURGICAL) ×1 IMPLANT
GLOVE INDICATOR 7.5 STRL GRN (GLOVE) ×2 IMPLANT
GLOVE SURG ENC MOIS LTX SZ7 (GLOVE) ×4 IMPLANT
GOWN STRL REUS W/ TWL LRG LVL3 (GOWN DISPOSABLE) ×2 IMPLANT
GOWN STRL REUS W/ TWL XL LVL3 (GOWN DISPOSABLE) ×1 IMPLANT
GOWN STRL REUS W/TWL LRG LVL3 (GOWN DISPOSABLE) ×4
GOWN STRL REUS W/TWL XL LVL3 (GOWN DISPOSABLE) ×2
HEMOSTAT SURGICEL 2X3 (HEMOSTASIS) ×2 IMPLANT
KIT TURNOVER KIT A (KITS) ×2 IMPLANT
LABEL OR SOLS (LABEL) ×2 IMPLANT
LOOP RED MAXI  1X406MM (MISCELLANEOUS) ×1
LOOP VESSEL MAXI 1X406 RED (MISCELLANEOUS) ×1 IMPLANT
LOOP VESSEL MINI 0.8X406 BLUE (MISCELLANEOUS) ×1 IMPLANT
LOOPS BLUE MINI 0.8X406MM (MISCELLANEOUS) ×1
MANIFOLD NEPTUNE II (INSTRUMENTS) ×2 IMPLANT
PACK EXTREMITY ARMC (MISCELLANEOUS) ×2 IMPLANT
PAD PREP 24X41 OB/GYN DISP (PERSONAL CARE ITEMS) ×2 IMPLANT
STOCKINETTE STRL 4IN 9604848 (GAUZE/BANDAGES/DRESSINGS) ×2 IMPLANT
SUT MNCRL AB 4-0 PS2 18 (SUTURE) ×2 IMPLANT
SUT PROLENE 6 0 BV (SUTURE) ×2 IMPLANT
SUT SILK 0 (SUTURE) ×2
SUT SILK 0 30XBRD TIE 6 (SUTURE) ×1 IMPLANT
SUT VIC AB 3-0 SH 27 (SUTURE) ×2
SUT VIC AB 3-0 SH 27X BRD (SUTURE) ×1 IMPLANT
WATER STERILE IRR 500ML POUR (IV SOLUTION) ×2 IMPLANT

## 2020-05-21 NOTE — OR Nursing (Signed)
Right upper arm with good thrill . Right radial pulse is 2+  hand warm with <3 second cap. Refill.

## 2020-05-21 NOTE — Op Note (Signed)
[facility]   OPERATIVE NOTE  DATE: 05/21/2020  PRE-OPERATIVE DIAGNOSIS: End-stage renal disease, steal syndrome right arm with patent right brachial artery to axillary vein AV graft  POST-OPERATIVE DIAGNOSIS: same as above  PROCEDURE: 1.   Banding of right brachial artery to axillary vein AV graft  SURGEON: Leotis Pain, MD  ASSISTANT(S): Hezzie Bump, PA-C  ANESTHESIA: General  ESTIMATED BLOOD LOSS: minimal  FINDING(S): 1.  none  SPECIMEN(S):  None  INDICATIONS:   Patient is a 70 y.o.male who presents with steal syndrome in the right arm with a large patent right brachial artery to axillary vein AV graft.  He has already undergone percutaneous intervention for the arterial disease in that arm but his steal syndrome persists. Risks and benefits were discussed and the patient was agreeable to proceed. An assistant was present during the procedure to help facilitate the exposure and expedite the procedure.  DESCRIPTION: After obtaining full informed written consent, the patient was brought back to the operating room and placed supine upon the operating table.  The patient received IV antibiotics prior to induction.  After obtaining adequate anesthesia, the patient was prepped and draped in the standard fashion. The assistant provided retraction and mobilization to help facilitate exposure and expedite the procedure throughout the entire procedure.  This included following suture, using retractors, and optimizing lighting. I created a small transverse incision in the mid to distal upper arm overlying the AV graft.  The graft was dissected out.  In that area, 2 silk ties were used to band the AV graft and reduce its size to about 3-4 mm.  There was still a thrill present proximal to the banding, but the graft was clearly narrowed and the flow reduced. The wound was then irrigated and closed with a 3-0 Vicryl and a 4-0 Monocryl. Dermabond was placed as a dressing. The  patient was taken to the recovery room in stable condition having tolerated the procedure well.  COMPLICATIONS: None  CONDITION: Stable   Leotis Pain 05/21/2020 1:30 PM  This note was created with Dragon Medical transcription system. Any errors in dictation are purely unintentional.

## 2020-05-21 NOTE — Anesthesia Preprocedure Evaluation (Signed)
Anesthesia Evaluation  Patient identified by MRN, date of birth, ID band Patient awake    Reviewed: Allergy & Precautions, H&P , NPO status , Patient's Chart, lab work & pertinent test results  Airway Mallampati: II       Dental  (+) Upper Dentures, Lower Dentures   Pulmonary neg pulmonary ROS, former smoker,    Pulmonary exam normal breath sounds clear to auscultation       Cardiovascular hypertension, + CAD, + Past MI, + Peripheral Vascular Disease and +CHF  Normal cardiovascular exam Rhythm:Regular Rate:Normal     Neuro/Psych PSYCHIATRIC DISORDERS Anxiety  Neuromuscular disease    GI/Hepatic negative GI ROS, Neg liver ROS,   Endo/Other  diabetes  Renal/GU ESRFRenal disease  negative genitourinary   Musculoskeletal negative musculoskeletal ROS (+)   Abdominal   Peds negative pediatric ROS (+)  Hematology negative hematology ROS (+)   Anesthesia Other Findings Past Medical History: No date: Anemia No date: Anxiety No date: CHF (congestive heart failure) (HCC) No date: Chronic kidney disease     Comment:  esrd dialysis m/w/f No date: Gout No date: Hyperlipidemia No date: Hypertension 2010: Myocardial infarction [facility])     Comment:  10 years ago 2020: Neuromuscular disorder ([place])     Comment:  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 No date: Peripheral vascular disease (HCC)   Reproductive/Obstetrics negative OB ROS                             Anesthesia Physical Anesthesia Plan  ASA: IV  Anesthesia Plan: General   Post-op Pain Management:    Induction: Intravenous  PONV Risk Score and Plan: 2 and Ondansetron and Dexamethasone  Airway Management Planned: LMA  Additional Equipment:   Intra-op Plan:   Post-operative Plan:   Informed Consent: I have reviewed the patients History and Physical, chart, labs and discussed the procedure including the risks, benefits and  alternatives for the proposed anesthesia with the patient or authorized representative who has indicated his/her understanding and acceptance.       Plan Discussed with: CRNA, Anesthesiologist and Surgeon  Anesthesia Plan Comments:         Anesthesia Quick Evaluation

## 2020-05-21 NOTE — Discharge Instructions (Signed)
AMBULATORY SURGERY  DISCHARGE INSTRUCTIONS   1) The drugs that you were given will stay in your system until tomorrow so for the next 24 hours you should not:  A) Drive an automobile B) Make any legal decisions C) Drink any alcoholic beverage   2) You may resume regular meals tomorrow.  Today it is better to start with liquids and gradually work up to solid foods.  You may eat anything you prefer, but it is better to start with liquids, then soup and crackers, and gradually work up to solid foods.   3) Please notify your doctor immediately if you have any unusual bleeding, trouble breathing, redness and pain at the surgery site, drainage, fever, or pain not relieved by medication.  4) Your post-operative visit with Dr.                                     is: Date:                        Time:    Please call to schedule your post-operative visit.  5) Additional Instructions:  

## 2020-05-21 NOTE — Anesthesia Procedure Notes (Signed)
Procedure Name: LMA Insertion Date/Time: 05/21/2020 12:55 PM Performed by: Lia Foyer, CRNA Pre-anesthesia Checklist: Patient identified, Emergency Drugs available, Suction available and Patient being monitored Patient Re-evaluated:Patient Re-evaluated prior to induction Oxygen Delivery Method: Circle system utilized Preoxygenation: Pre-oxygenation with 100% oxygen Induction Type: IV induction Ventilation: Mask ventilation without difficulty LMA: LMA flexible inserted LMA Size: 4.5 Tube type: Oral Number of attempts: 1 Airway Equipment and Method: Oral airway Placement Confirmation: positive ETCO2 and breath sounds checked- equal and bilateral Tube secured with: Tape Dental Injury: Teeth and Oropharynx as per pre-operative assessment

## 2020-05-21 NOTE — Anesthesia Postprocedure Evaluation (Signed)
Anesthesia Post Note  Patient: Johnny [name].  Procedure(s) Performed: BANDING HERO GRAFT (Right )  Patient location during evaluation: PACU Anesthesia Type: General Level of consciousness: awake Pain management: pain level controlled Vital Signs Assessment: post-procedure vital signs reviewed and stable Respiratory status: spontaneous breathing Cardiovascular status: stable Postop Assessment: no apparent nausea or vomiting Anesthetic complications: no   No complications documented.   Last Vitals:  Vitals:   05/21/20 1220 05/21/20 1337  BP: (!) 181/77 (!) 128/51  Pulse: 85 66  Resp: 16 15  Temp: (!) 36.4 C (!) 36.2 C  SpO2: 100% 100%    Last Pain:  Vitals:   05/21/20 1337  TempSrc:   PainSc: Asleep                 Neva Seat

## 2020-05-21 NOTE — Transfer of Care (Signed)
Immediate Anesthesia Transfer of Care Note  Patient: Johnny [name].  Procedure(s) Performed: BANDING HERO GRAFT (Right )  Patient Location: PACU  Anesthesia Type:General  Level of Consciousness: drowsy  Airway & Oxygen Therapy: Patient Spontanous Breathing and Patient connected to face mask oxygen  Post-op Assessment: Report given to RN and Post -op Vital signs reviewed and stable  Post vital signs: Reviewed and stable  Last Vitals:  Vitals Value Taken Time  BP 128/51 05/21/20 1337  Temp 36.2 C 05/21/20 1337  Pulse 65 05/21/20 1339  Resp 15 05/21/20 1339  SpO2 100 % 05/21/20 1339  Vitals shown include unvalidated device data.  Last Pain:  Vitals:   05/21/20 1220  TempSrc: Temporal  PainSc: 8          Complications: No complications documented.

## 2020-05-21 NOTE — Interval H&P Note (Signed)
History and Physical Interval Note:  05/21/2020 11:58 AM  Johnny [name].  has presented today for surgery, with the diagnosis of ESRD.  The various methods of treatment have been discussed with the patient and family. After consideration of risks, benefits and other options for treatment, the patient has consented to  Procedure(s): BANDING HERO GRAFT (Right) as a surgical intervention.  The patient's history has been reviewed, patient examined, no change in status, stable for surgery.  I have reviewed the patient's chart and labs.  Questions were answered to the patient's satisfaction.     Leotis Pain

## 2020-05-26 ENCOUNTER — Ambulatory Visit: Payer: Medicare Other

## 2020-05-26 ENCOUNTER — Telehealth (INDEPENDENT_AMBULATORY_CARE_PROVIDER_SITE_OTHER): Payer: Self-pay

## 2020-05-26 ENCOUNTER — Emergency Department
Admission: EM | Admit: 2020-05-26 | Discharge: 2020-05-26 | Disposition: A | Discharge status: Home / Self Care | Payer: Medicare Other | Attending: Emergency Medicine | Admitting: Emergency Medicine

## 2020-05-26 ENCOUNTER — Other Ambulatory Visit: Payer: Self-pay

## 2020-05-26 ENCOUNTER — Encounter: Payer: Self-pay | Admitting: Emergency Medicine

## 2020-05-26 DIAGNOSIS — E1122 Type 2 diabetes mellitus with diabetic chronic kidney disease: Secondary | ICD-10-CM | POA: Diagnosis not present

## 2020-05-26 DIAGNOSIS — I5032 Chronic diastolic (congestive) heart failure: Secondary | ICD-10-CM | POA: Diagnosis not present

## 2020-05-26 DIAGNOSIS — M792 Neuralgia and neuritis, unspecified: Secondary | ICD-10-CM | POA: Insufficient documentation

## 2020-05-26 DIAGNOSIS — N186 End stage renal disease: Secondary | ICD-10-CM | POA: Insufficient documentation

## 2020-05-26 DIAGNOSIS — Z7982 Long term (current) use of aspirin: Secondary | ICD-10-CM | POA: Diagnosis not present

## 2020-05-26 DIAGNOSIS — Z87891 Personal history of nicotine dependence: Secondary | ICD-10-CM | POA: Diagnosis not present

## 2020-05-26 DIAGNOSIS — R200 Anesthesia of skin: Secondary | ICD-10-CM | POA: Insufficient documentation

## 2020-05-26 DIAGNOSIS — I132 Hypertensive heart and chronic kidney disease with heart failure and with stage 5 chronic kidney disease, or end stage renal disease: Secondary | ICD-10-CM | POA: Diagnosis not present

## 2020-05-26 DIAGNOSIS — Z79899 Other long term (current) drug therapy: Secondary | ICD-10-CM | POA: Diagnosis not present

## 2020-05-26 DIAGNOSIS — G8918 Other acute postprocedural pain: Secondary | ICD-10-CM

## 2020-05-26 MED ORDER — OXYCODONE-ACETAMINOPHEN 5-325 MG PO TABS: 2.0000 | ORAL_TABLET | ORAL | 0 refills | Status: DC | PRN

## 2020-05-26 MED ORDER — OXYCODONE HCL 5 MG PO TABS: 5.0000 mg | ORAL_TABLET | Freq: Four times a day (QID) | ORAL | 0 refills | Status: AC | PRN

## 2020-05-26 MED ORDER — GABAPENTIN 100 MG PO CAPS: 300.0000 mg | ORAL_CAPSULE | ORAL | 0 refills | Status: DC

## 2020-05-26 NOTE — ED Provider Notes (Signed)
[facility] Emergency Department Provider Note  ____________________________________________   Event Date/Time   First MD Initiated Contact with Patient 05/26/20 1603     (approximate)  I have reviewed the triage vital signs and the nursing notes.   HISTORY  Chief Complaint No chief complaint on file.    HPI Johnny [name]. is a 70 y.o. male with past medical history as below here with right hand numbness and tingling.  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  This was with Dr. Laurence Compton.  He states that he initially had mild improvement, but over the last week, has had return of persistent numbness, tingling, and intermittent hot and cold sensations.  He has had cramping in the hand.  He states he has had pain with even light touch.  The pain is worse with any kind of movement.  It is worse in the cold.  No alleviating factors.  No fevers or chills.  No new areas of pain.  No new weakness.  Pain feels essentially similar to before his surgery per his report, though allodynia seems to be improving        Past Medical History:  Diagnosis Date  . Anemia   . Anxiety   . CHF (congestive heart failure) ([place])   . Chronic kidney disease    esrd dialysis m/w/f  . Gout   . Hyperlipidemia   . Hypertension   . Myocardial infarction ([place]) 2010   10 years ago  . 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  . Peripheral vascular disease [facility])     Patient Active Problem List   Diagnosis Date Noted  . Cardiomyopathy ([place]) 01/22/2019  . CHF (congestive heart failure) ([place]) 01/22/2019  . Coronary disease 01/22/2019  . 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 Leg ulcer, right, with fat layer exposed ([place]) 10/22/2018  . Cellulitis of right leg 07/21/2018  . Lower limb ulcer, calf, right, limited to breakdown of skin ([place])  06/08/2018  . PVD (peripheral vascular disease) ([place]) 04/17/2018  . Malnutrition of moderate degree 04/11/2018  . Pressure injury of skin 04/06/2018  . Altered mental status 04/04/2018  . Hypothermia 04/04/2018  . Hemodialysis graft malfunction ([place]) 03/26/2018  . Hypercholesterolemia 02/27/2018  . Diabetes ([place]) 02/27/2018  . Weakness of right lower extremity 01/20/2018  . Fever   . Periumbilical abdominal pain   . Confusion 12/22/2017  . Acute delirium 12/21/2017  . Protein-calorie malnutrition, severe 12/19/2017  . Intractable nausea and vomiting 12/18/2017  . Lymphedema 12/13/2017  . Cellulitis 11/27/2017  . Chest pain 11/19/2017  . Atherosclerosis of native arteries of the extremities with ulceration ([place]) 11/07/2017  . Twitching 01/03/2017  . Elevated troponin 10/02/2015  . Complications, dialysis, catheter, mechanical ([place]) 10/02/2015  . Musculoskeletal chest pain 09/28/2015  . Chronic diastolic CHF (congestive heart failure) ([place]) 09/28/2015  . End stage renal disease ([place]) 10/09/2012  . Hypertension 10/09/2012  . Gout 10/09/2012    Past Surgical History:  Procedure Laterality Date  . A/V FISTULAGRAM Right 09/06/2018   Procedure: A/V FISTULAGRAM;  Surgeon: Algernon Huxley, MD;  Location: [place] CV LAB;  Service: Cardiovascular;  Laterality: Right;  . A/V SHUNTOGRAM Left 06/21/2017   Procedure: A/V SHUNTOGRAM;  Surgeon: Katha Cabal, MD;  Location: [place] CV LAB;  Service: Cardiovascular;  Laterality: Left;  . A/V SHUNTOGRAM N/A 10/24/2018  Procedure: A/V SHUNTOGRAM;  Surgeon: Algernon Huxley, MD;  Location: [place] CV LAB;  Service: Cardiovascular;  Laterality: N/A;  . ABOVE KNEE LEG AMPUTATION Right 2020  . AMPUTATION Right 10/25/2018   Procedure: AMPUTATION ABOVE KNEE;  Surgeon: Algernon Huxley, MD;  Location: [facility] ORS;  Service: General;  Laterality: Right;  . APPLICATION OF WOUND VAC Right 04/11/2018   Procedure: APPLICATION OF WOUND VAC;  Surgeon:  Algernon Huxley, MD;  Location: [facility] ORS;  Service: Vascular;  Laterality: Right;  . AV FISTULA PLACEMENT Left 09/18/2015   Procedure: INSERTION OF ARTERIOVENOUS (AV) GORE-TEX GRAFT ARM ( BRACH/AXILLARY GRAFT W/ INSTANT STICK GRAFT );  Surgeon: Katha Cabal, MD;  Location: [facility] ORS;  Service: Vascular;  Laterality: Left;  . AV FISTULA PLACEMENT Right 07/19/2018   Procedure: INSERTION OF ARTERIOVENOUS (AV) GORE-TEX GRAFT ARM ( BRACHIAL AXILLARY);  Surgeon: Algernon Huxley, MD;  Location: [facility] ORS;  Service: Vascular;  Laterality: Right;  . DIALYSIS FISTULA CREATION Right 10/2017   right chest perm cath  . DIALYSIS/PERMA CATHETER REMOVAL N/A 09/13/2018   Procedure: DIALYSIS/PERMA CATHETER REMOVAL;  Surgeon: Algernon Huxley, MD;  Location: [place] [facility] LAB;  Service: Cardiovascular;  Laterality: N/A;  . ESOPHAGOGASTRODUODENOSCOPY N/A 12/19/2017   Procedure: ESOPHAGOGASTRODUODENOSCOPY (EGD);  Surgeon: Lin Landsman, MD;  Location: [facility];  Service: Gastroenterology;  Laterality: N/A;  . LOWER EXTREMITY ANGIOGRAPHY Left 11/16/2017   Procedure: LOWER EXTREMITY ANGIOGRAPHY;  Surgeon: Algernon Huxley, MD;  Location: [place] CV LAB;  Service: Cardiovascular;  Laterality: Left;  . LOWER EXTREMITY ANGIOGRAPHY Right 01/18/2018   Procedure: LOWER EXTREMITY ANGIOGRAPHY;  Surgeon: Algernon Huxley, MD;  Location: [place] CV LAB;  Service: Cardiovascular;  Laterality: Right;  . LOWER EXTREMITY ANGIOGRAPHY Left 04/02/2018   Procedure: LOWER EXTREMITY ANGIOGRAPHY;  Surgeon: Algernon Huxley, MD;  Location: [place] CV LAB;  Service: Cardiovascular;  Laterality: Left;  . LOWER EXTREMITY ANGIOGRAPHY Right 04/09/2018   Procedure: Lower Extremity Angiography with possible intervention;  Surgeon: Algernon Huxley, MD;  Location: [place] CV LAB;  Service: Cardiovascular;  Laterality: Right;  . LOWER EXTREMITY ANGIOGRAPHY Right 07/23/2018   Procedure: Lower Extremity Angiography;  Surgeon: Algernon Huxley, MD;   Location: [place] CV LAB;  Service: Cardiovascular;  Laterality: Right;  . LOWER EXTREMITY ANGIOGRAPHY Right 09/13/2018   Procedure: LOWER EXTREMITY ANGIOGRAPHY;  Surgeon: Algernon Huxley, MD;  Location: [place] CV LAB;  Service: Cardiovascular;  Laterality: Right;  . LOWER EXTREMITY VENOGRAPHY Right 09/13/2018   Procedure: LOWER EXTREMITY VENOGRAPHY;  Surgeon: Algernon Huxley, MD;  Location: [place] CV LAB;  Service: Cardiovascular;  Laterality: Right;  . PERIPHERAL VASCULAR CATHETERIZATION Left 09/01/2015   Procedure: A/V Shuntogram/Fistulagram;  Surgeon: Katha Cabal, MD;  Location: [place] CV LAB;  Service: Cardiovascular;  Laterality: Left;  . PERIPHERAL VASCULAR CATHETERIZATION N/A 09/30/2015   Procedure: A/V Shuntogram/Fistulagram with perm cathether removal;  Surgeon: Algernon Huxley, MD;  Location: [place] CV LAB;  Service: Cardiovascular;  Laterality: N/A;  . PERIPHERAL VASCULAR CATHETERIZATION Left 09/30/2015   Procedure: A/V Shunt Intervention;  Surgeon: Algernon Huxley, MD;  Location: [place] CV LAB;  Service: Cardiovascular;  Laterality: Left;  . PERIPHERAL VASCULAR CATHETERIZATION Left 12/03/2015   Procedure: Thrombectomy;  Surgeon: Algernon Huxley, MD;  Location: [place] CV LAB;  Service: Cardiovascular;  Laterality: Left;  . PERIPHERAL VASCULAR CATHETERIZATION Left 01/28/2016   Procedure: Thrombectomy;  Surgeon: Algernon Huxley, MD;  Location: [facility]  INVASIVE CV LAB;  Service: Cardiovascular;  Laterality: Left;  . PERIPHERAL VASCULAR CATHETERIZATION N/A 01/28/2016   Procedure: A/V Shuntogram/Fistulagram;  Surgeon: Algernon Huxley, MD;  Location: [place] CV LAB;  Service: Cardiovascular;  Laterality: N/A;  . SKIN SPLIT GRAFT Right 05/24/2018   Procedure: SKIN GRAFT SPLIT THICKNESS ( RIGHT CALF);  Surgeon: Algernon Huxley, MD;  Location: [facility] ORS;  Service: Vascular;  Laterality: Right;  . UPPER EXTREMITY ANGIOGRAPHY  10/24/2018   Procedure: Upper Extremity Angiography;   Surgeon: Algernon Huxley, MD;  Location: [place] CV LAB;  Service: Cardiovascular;;  . UPPER EXTREMITY ANGIOGRAPHY Right 02/14/2019   Procedure: UPPER EXTREMITY ANGIOGRAPHY;  Surgeon: Algernon Huxley, MD;  Location: [place] CV LAB;  Service: Cardiovascular;  Laterality: Right;  . UPPER EXTREMITY ANGIOGRAPHY Right 04/20/2020   Procedure: UPPER EXTREMITY ANGIOGRAPHY;  Surgeon: Algernon Huxley, MD;  Location: [place] CV LAB;  Service: Cardiovascular;  Laterality: Right;  . WOUND DEBRIDEMENT Right 04/11/2018   Procedure: DEBRIDEMENT WOUND calf muscle and skin;  Surgeon: Algernon Huxley, MD;  Location: [facility] ORS;  Service: Vascular;  Laterality: Right;    Prior to Admission medications   Medication Sig Start Date End Date Taking? Authorizing Provider  oxyCODONE (ROXICODONE) 5 MG immediate release tablet Take 1-2 tablets (5-10 mg total) by mouth every 6 (six) hours as needed for severe pain (No more than 6 tabs daily). 05/26/20 05/26/21 Yes Duffy Bruce, MD  allopurinol (ZYLOPRIM) 100 MG tablet Take 100 mg by mouth daily.    [provider]  amLODipine (NORVASC) 5 MG tablet Take 1 tablet (5 mg total) by mouth daily. 10/30/18   Mayo, Pete Pelt, MD  aspirin EC 81 MG tablet Take 1 tablet (81 mg total) by mouth daily. 04/20/20   Algernon Huxley, MD  atorvastatin (LIPITOR) 10 MG tablet Take 1 tablet by mouth daily. 11/23/17   [provider]  clonazePAM (KLONOPIN) 0.5 MG tablet Take 1 tablet (0.5 mg total) by mouth as needed. 07/26/18   Bettey Costa, MD  clopidogrel (PLAVIX) 75 MG tablet Take 1 tablet (75 mg total) by mouth daily. 04/02/18   Algernon Huxley, MD  folic acid-vitamin b complex-vitamin c-selenium-zinc (DIALYVITE) 3 MG TABS tablet Take 1 tablet by mouth daily.     [provider]  gabapentin (NEURONTIN) 100 MG capsule Take 3 capsules (300 mg total) by mouth 3 (three) times a week. After dialysis session 05/27/20   Duffy Bruce, MD  hydrALAZINE (APRESOLINE) 25 MG tablet  Take 25 mg by mouth 2 (two) times daily. 11/26/19   [provider]  losartan (COZAAR) 100 MG tablet Take 100 mg by mouth daily.    [provider]  metoprolol tartrate (LOPRESSOR) 25 MG tablet Take 25 mg by mouth every evening.    [provider]  Multiple Vitamin (MULTIVITAMIN) LIQD Take 5 mLs by mouth daily.    [provider]  sevelamer carbonate (RENVELA) 800 MG tablet Take 2,400 mg by mouth 3 (three) times daily with meals.  09/20/18   [provider]  SPS 15 GM/60ML suspension Take by mouth. Patient not taking: Reported on 05/21/2020 04/06/20   [provider]    Allergies Shellfish allergy  Family History  Problem Relation Age of Onset  . Hypertension Other   . Heart disease Other   . Diabetes Mother     Social History Social History   Tobacco Use  . Smoking status: Former Smoker    Types: Cigarettes  Quit date: 05/17/2005    Years since quitting: 15.0  . Smokeless tobacco: Never Used  Vaping Use  . Vaping Use: Never used  Substance Use Topics  . Alcohol use: No  . Drug use: No    Review of Systems  Review of Systems  Constitutional: Negative for chills and fever.  HENT: Negative for sore throat.   Respiratory: Negative for shortness of breath.   Cardiovascular: Negative for chest pain.  Gastrointestinal: Negative for abdominal pain.  Genitourinary: Negative for flank pain.  Musculoskeletal: Negative for neck pain.  Skin: Negative for rash and wound.  Allergic/Immunologic: Negative for immunocompromised state.  Neurological: Positive for weakness and numbness.  Hematological: Does not bruise/bleed easily.     ____________________________________________  PHYSICAL EXAM:      VITAL SIGNS: ED Triage Vitals  Enc Vitals Group     BP 05/26/20 1304 (!) 161/49     Pulse Rate 05/26/20 1304 80     Resp 05/26/20 1304 16     Temp 05/26/20 1304 98.1 F (36.7 C)     Temp src --      SpO2 05/26/20 1304 97 %      Weight 05/26/20 1302 171 lb 15.3 oz (78 kg)     Height 05/26/20 1302 '5\' 8"'$  (1.727 m)     Head Circumference --      Peak Flow --      Pain Score 05/26/20 1302 8     Pain Loc --      Pain Edu? --      Excl. in [place]? --      Physical Exam Vitals and nursing note reviewed.  Constitutional:      General: He is not in acute distress.    Appearance: He is well-developed.  HENT:     Head: Normocephalic and atraumatic.  Eyes:     Conjunctiva/sclera: Conjunctivae normal.  Cardiovascular:     Rate and Rhythm: Normal rate and regular rhythm.     Heart sounds: Normal heart sounds. No murmur heard. No friction rub.  Pulmonary:     Effort: Pulmonary effort is normal. No respiratory distress.     Breath sounds: Normal breath sounds. No wheezing or rales.  Abdominal:     General: There is no distension.     Palpations: Abdomen is soft.     Tenderness: There is no abdominal tenderness.  Musculoskeletal:     Cervical back: Neck supple.  Skin:    General: Skin is warm.     Capillary Refill: Capillary refill takes less than 2 seconds.  Neurological:     Mental Status: He is alert and oriented to person, place, and time.     Motor: No abnormal muscle tone.      UPPER EXTREMITY EXAM: RIGHT  INSPECTION & PALPATION: Right upper extremity warm and well-perfused throughout.  SENSORY: Sensation is intact to light touch in:  Superficial radial nerve distribution (dorsal first web space) Median nerve distribution (tip of index finger)   Ulnar nerve distribution (tip of small finger)     MOTOR:  + Motor posterior interosseous nerve (thumb IP extension) + Anterior interosseous nerve (thumb IP flexion, index finger DIP flexion) + Radial nerve (wrist extension) + Median nerve (palpable firing thenar mass) + Ulnar nerve (palpable firing of first dorsal interosseous muscle)  VASCULAR: 2+ radial pulse Brisk capillary refill < 2 sec, fingers warm and well-perfused  COMPARTMENTS: Soft, warm,  well-perfused No pain with passive extension No paresthesias   ____________________________________________  LABS (all labs ordered are listed, but only abnormal results are displayed)  Labs Reviewed - No data to display  ____________________________________________  EKG:  ________________________________________  RADIOLOGY All imaging, including plain films, CT scans, and ultrasounds, independently reviewed by me, and interpretations confirmed via formal radiology reads.  ED MD interpretation:     Official radiology report(s): No results found.  ____________________________________________  PROCEDURES   Procedure(s) performed (including Critical Care):  Procedures  ____________________________________________  INITIAL IMPRESSION / MDM / [place] / ED COURSE  As part of my medical decision making, I reviewed the following data within the [place] notes reviewed and incorporated, Old chart reviewed, Notes from prior ED visits, and [place] Controlled Substance Database       *Deveon Ezekiel. was evaluated in Emergency Department on 05/26/2020 for the symptoms described in the history of present illness. He was evaluated in the context of the global COVID-19 pandemic, which necessitated consideration that the patient might be at risk for infection with the SARS-CoV-2 virus that causes COVID-19. Institutional protocols and algorithms that pertain to the evaluation of patients at risk for COVID-19 are in a state of rapid change based on information released by regulatory bodies including the CDC and federal and state organizations. These policies and algorithms were followed during the patient's care in the ED.  Some ED evaluations and interventions may be delayed as a result of limited staffing during the pandemic.*     Medical Decision Making: 69 year old right-hand-dominant male here with right hand pain after recent revascularization  procedure with Dr. Lucky Cowboy.  Clinically, the extremity is warm, well-perfused, and neurologically intact.  He has good capillary refill.  Compartments are soft.  Suspect possible reperfusion related pain, versus recurrence of steal syndrome given his fairly extensive vascular history.  He has been taking his aspirin and Plavix.  No signs of ongoing ischemia.  Will discuss with vascular surgery.  Discussed with Dr. Lucky Cowboy, who is familiar with the patient's care.  Briefly, patient has fairly severe vascular disease and this is likely reperfusion versus just ongoing nerve related damage from his poor vasculature.  At this time, given that the hand is warm, well-perfused, and the patient is otherwise well-appearing, no apparent emergent imaging or work-up needed.  Will increase his gabapentin to 303 times a week with dialysis, with caution about the risks of gabapentin use in dialysis patients.  Will also add on oxycodone as needed.  Have also discussed keeping the hand warm and protected, to minimize allodynia and cold related pain.  He will follow-up with vascular surgery as an outpatient.  Return precautions given.  ____________________________________________  FINAL CLINICAL IMPRESSION(S) / ED DIAGNOSES  Final diagnoses:  Post-operative pain  Neuropathic pain     MEDICATIONS GIVEN DURING THIS VISIT:  Medications - No data to display   ED Discharge Orders         Ordered    gabapentin (NEURONTIN) 100 MG capsule  3 times weekly        05/26/20 1728    oxyCODONE-acetaminophen (PERCOCET) 5-325 MG tablet  Every 4 hours PRN,   Status:  Discontinued        05/26/20 1643    oxyCODONE (ROXICODONE) 5 MG immediate release tablet  Every 6 hours PRN        05/26/20 1728           Note:  This document was prepared using Dragon voice recognition software and may include unintentional dictation errors.  Duffy Bruce, MD 05/26/20 1744

## 2020-05-26 NOTE — ED Triage Notes (Signed)
States had an operation to right hand last Thursday.  Arrives today c/o right hand cold, numb and aching and unable to use hand.  States hand was cold and numb prior to surgery. Dr. Lucky Cowboy did surgery.  Right hand warm to touch.  + radial pulse palpable.  Brisk capillary refill.  NAD

## 2020-05-26 NOTE — Clinical Social Work Note (Signed)
CSW received call that pt was in need of transportation home. Pt has one leg amputation and does not have his wheelchair at hospital. CSW reached out to [employer] after hours who state that they can't do anything with wheelchairs after hours. CSW updated ED staff who will contact EMS for pt to return home. TOC signing off.

## 2020-05-26 NOTE — Telephone Encounter (Addendum)
Patient friend Ms [name] left a voicemail stating the patient has been in the bed since Saturday,was not able to go to dialysis yesterday,weak,nausea,and the patient whole body is in pain.I received a second voicemail from Will an occupational therapist with [facility] that the patient is experiencing 8 out of 10 hand pain since surgery on 05/21/20 and is concern on when he he should follow up. I spoke with Eulogio Ditch NP and she recommend that the patient be evaluated at ER and patient friend has been made aware with medical recommendations.

## 2020-05-26 NOTE — Discharge Instructions (Signed)
As we discussed:  Start the oxycodone for severe pain  We are going to change your GABAPENTIN dose: -Tonight, take 200 mg (2 capsules) -Starting tomorrow, take 300 mg AFTER DIALYSIS three times a week  Take tylenol 1000 mg every 4-6 hours for moderate pain  Keep the hand warm, covered

## 2020-05-28 ENCOUNTER — Ambulatory Visit: Payer: Medicare Other

## 2020-06-02 ENCOUNTER — Ambulatory Visit: Payer: Medicare Other

## 2020-06-02 ENCOUNTER — Other Ambulatory Visit (INDEPENDENT_AMBULATORY_CARE_PROVIDER_SITE_OTHER): Payer: Self-pay | Admitting: Vascular Surgery

## 2020-06-02 DIAGNOSIS — N186 End stage renal disease: Secondary | ICD-10-CM

## 2020-06-04 ENCOUNTER — Other Ambulatory Visit (INDEPENDENT_AMBULATORY_CARE_PROVIDER_SITE_OTHER): Payer: Medicare Other

## 2020-06-04 ENCOUNTER — Ambulatory Visit: Payer: Medicare Other

## 2020-06-04 ENCOUNTER — Ambulatory Visit (INDEPENDENT_AMBULATORY_CARE_PROVIDER_SITE_OTHER): Payer: Medicare Other | Admitting: Nurse Practitioner

## 2020-06-05 ENCOUNTER — Telehealth (INDEPENDENT_AMBULATORY_CARE_PROVIDER_SITE_OTHER): Payer: Self-pay

## 2020-06-05 NOTE — Telephone Encounter (Signed)
Simona Huh from [facility] left a voicemail stating that she went to the patient home for the first time today and the patient was complaining with abdominal pain on a scale of 9. The patient is trying to have an bowel movement but he didi have one yesterday.The nurse spoke with Ms [name] and she stated patient has had this pain over a week. The nurse suggested that patient take some pepto bismol but the patient has already tried this and also milk magnesia,epsom salt,coffee. The patient is able to answer questions but he just wan to lay in bed. The did have dialysis today and nurse knows that this can cause tiredness. I spoke with Eulogio Ditch NP and she advise for the home health nurse to contact the patient PCP. A detailed message has been left on the nurse voicemail.

## 2020-06-08 ENCOUNTER — Emergency Department
Admission: EM | Admit: 2020-06-08 | Discharge: 2020-06-08 | Disposition: A | Discharge status: Home / Self Care | Payer: Medicare Other | Attending: Emergency Medicine | Admitting: Emergency Medicine

## 2020-06-08 ENCOUNTER — Emergency Department: Payer: Medicare Other

## 2020-06-08 ENCOUNTER — Encounter: Payer: Self-pay | Admitting: Emergency Medicine

## 2020-06-08 ENCOUNTER — Other Ambulatory Visit: Payer: Self-pay

## 2020-06-08 DIAGNOSIS — Z87891 Personal history of nicotine dependence: Secondary | ICD-10-CM | POA: Diagnosis not present

## 2020-06-08 DIAGNOSIS — E1151 Type 2 diabetes mellitus with diabetic peripheral angiopathy without gangrene: Secondary | ICD-10-CM | POA: Insufficient documentation

## 2020-06-08 DIAGNOSIS — N186 End stage renal disease: Secondary | ICD-10-CM | POA: Diagnosis not present

## 2020-06-08 DIAGNOSIS — Z992 Dependence on renal dialysis: Secondary | ICD-10-CM | POA: Diagnosis not present

## 2020-06-08 DIAGNOSIS — Z79899 Other long term (current) drug therapy: Secondary | ICD-10-CM | POA: Insufficient documentation

## 2020-06-08 DIAGNOSIS — I2510 Atherosclerotic heart disease of native coronary artery without angina pectoris: Secondary | ICD-10-CM | POA: Insufficient documentation

## 2020-06-08 DIAGNOSIS — R531 Weakness: Secondary | ICD-10-CM

## 2020-06-08 DIAGNOSIS — I5032 Chronic diastolic (congestive) heart failure: Secondary | ICD-10-CM | POA: Diagnosis not present

## 2020-06-08 DIAGNOSIS — Z20822 Contact with and (suspected) exposure to covid-19: Secondary | ICD-10-CM | POA: Diagnosis not present

## 2020-06-08 DIAGNOSIS — E1122 Type 2 diabetes mellitus with diabetic chronic kidney disease: Secondary | ICD-10-CM | POA: Insufficient documentation

## 2020-06-08 DIAGNOSIS — I132 Hypertensive heart and chronic kidney disease with heart failure and with stage 5 chronic kidney disease, or end stage renal disease: Secondary | ICD-10-CM | POA: Diagnosis not present

## 2020-06-08 DIAGNOSIS — R5383 Other fatigue: Secondary | ICD-10-CM | POA: Insufficient documentation

## 2020-06-08 DIAGNOSIS — Z7982 Long term (current) use of aspirin: Secondary | ICD-10-CM | POA: Diagnosis not present

## 2020-06-08 LAB — COMPREHENSIVE METABOLIC PANEL
ALT: 5 U/L (ref 0–44)
AST: 12 U/L — ABNORMAL LOW (ref 15–41)
Albumin: 4 g/dL (ref 3.5–5.0)
Alkaline Phosphatase: 52 U/L (ref 38–126)
Anion gap: 14 (ref 5–15)
BUN: 40 mg/dL — ABNORMAL HIGH (ref 8–23)
CO2: 27 mmol/L (ref 22–32)
Calcium: 11.8 mg/dL — ABNORMAL HIGH (ref 8.9–10.3)
Chloride: 98 mmol/L (ref 98–111)
Creatinine, Ser: 13.25 mg/dL — ABNORMAL HIGH (ref 0.61–1.24)
GFR, Estimated: 4 mL/min — ABNORMAL LOW (ref >60)
Glucose, Bld: 82 mg/dL (ref 70–99)
Potassium: 4.4 mmol/L (ref 3.5–5.1)
Sodium: 139 mmol/L (ref 135–145)
Total Bilirubin: 0.7 mg/dL (ref 0.3–1.2)
Total Protein: 7.8 g/dL (ref 6.5–8.1)

## 2020-06-08 LAB — CBC
HCT: 36.4 % — ABNORMAL LOW (ref 39.0–52.0)
Hemoglobin: 11.2 g/dL — ABNORMAL LOW (ref 13.0–17.0)
MCH: 31.2 pg (ref 26.0–34.0)
MCHC: 30.8 g/dL (ref 30.0–36.0)
MCV: 101.4 fL — ABNORMAL HIGH (ref 80.0–100.0)
Platelets: 248 10*3/uL (ref 150–400)
RBC: 3.59 MIL/uL — ABNORMAL LOW (ref 4.22–5.81)
RDW: 14.1 % (ref 11.5–15.5)
WBC: 8.8 10*3/uL (ref 4.0–10.5)
nRBC: 0 % (ref 0.0–0.2)

## 2020-06-08 LAB — SARS CORONAVIRUS 2 (TAT 6-24 HRS): SARS Coronavirus 2: NEGATIVE

## 2020-06-08 NOTE — ED Provider Notes (Signed)
[facility] Emergency Department Provider Note   ____________________________________________    I have reviewed the triage vital signs and the nursing notes.   HISTORY  Chief Complaint Weakness     HPI Johnny [name]. is a 70 y.o. male with a history of CHF, end-stage renal disease, CAD, hypertension who presents with complaints of generalized fatigue.  Patient reports that he had a hard time getting out of bed this morning because he was more tired than typical.  He went to dialysis and they felt like he "did not look right "so they sent him to the ED for evaluation.  He reports he feels at baseline currently has no complaints.  No neuro deficits.  No headache.  No chest pain.  No palpitations.  Has been vaccinated against COVID-19 but does have some concern about possible Covid exposure.  Past Medical History:  Diagnosis Date  . Anemia   . Anxiety   . CHF (congestive heart failure) ([place])   . Chronic kidney disease    esrd dialysis m/w/f  . Gout   . Hyperlipidemia   . Hypertension   . Myocardial infarction ([place]) 2010   10 years ago  . 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  . Peripheral vascular disease [facility])     Patient Active Problem List   Diagnosis Date Noted  . Cardiomyopathy ([place]) 01/22/2019  . CHF (congestive heart failure) ([place]) 01/22/2019  . Coronary disease 01/22/2019  . 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 Leg ulcer, right, with fat layer exposed ([place]) 10/22/2018  . Cellulitis of right leg 07/21/2018  . Lower limb ulcer, calf, right, limited to breakdown of skin ([place]) 06/08/2018  . PVD (peripheral vascular disease) ([place]) 04/17/2018  . Malnutrition of moderate degree 04/11/2018  . Pressure injury of skin 04/06/2018  . Altered mental status 04/04/2018  . Hypothermia 04/04/2018  . Hemodialysis graft malfunction ([place]) 03/26/2018  . Hypercholesterolemia  02/27/2018  . Diabetes ([place]) 02/27/2018  . Weakness of right lower extremity 01/20/2018  . Fever   . Periumbilical abdominal pain   . Confusion 12/22/2017  . Acute delirium 12/21/2017  . Protein-calorie malnutrition, severe 12/19/2017  . Intractable nausea and vomiting 12/18/2017  . Lymphedema 12/13/2017  . Cellulitis 11/27/2017  . Chest pain 11/19/2017  . Atherosclerosis of native arteries of the extremities with ulceration ([place]) 11/07/2017  . Twitching 01/03/2017  . Elevated troponin 10/02/2015  . Complications, dialysis, catheter, mechanical ([place]) 10/02/2015  . Musculoskeletal chest pain 09/28/2015  . Chronic diastolic CHF (congestive heart failure) ([place]) 09/28/2015  . End stage renal disease ([place]) 10/09/2012  . Hypertension 10/09/2012  . Gout 10/09/2012    Past Surgical History:  Procedure Laterality Date  . A/V FISTULAGRAM Right 09/06/2018   Procedure: A/V FISTULAGRAM;  Surgeon: Algernon Huxley, MD;  Location: [place] [facility];  Service: Cardiovascular;  Laterality: Right;  . A/V SHUNTOGRAM Left 06/21/2017   Procedure: A/V SHUNTOGRAM;  Surgeon: Katha Cabal, MD;  Location: [place] [facility];  Service: Cardiovascular;  Laterality: Left;  . A/V SHUNTOGRAM N/A 10/24/2018   Procedure: A/V SHUNTOGRAM;  Surgeon: Algernon Huxley, MD;  Location: [place] [facility];  Service: Cardiovascular;  Laterality: N/A;  . ABOVE KNEE LEG AMPUTATION Right 2020  . AMPUTATION Right 10/25/2018   Procedure: AMPUTATION ABOVE KNEE;  Surgeon: Algernon Huxley, MD;  Location: [facility];  Service: General;  Laterality: Right;  . APPLICATION OF WOUND  VAC Right 04/11/2018   Procedure: APPLICATION OF WOUND VAC;  Surgeon: Algernon Huxley, MD;  Location: [facility];  Service: Vascular;  Laterality: Right;  . AV FISTULA PLACEMENT Left 09/18/2015   Procedure: INSERTION OF ARTERIOVENOUS (AV) GORE-TEX GRAFT ARM ( BRACH/AXILLARY GRAFT W/ INSTANT STICK GRAFT );  Surgeon: Katha Cabal, MD;  Location: [facility];   Service: Vascular;  Laterality: Left;  . AV FISTULA PLACEMENT Right 07/19/2018   Procedure: INSERTION OF ARTERIOVENOUS (AV) GORE-TEX GRAFT ARM ( BRACHIAL AXILLARY);  Surgeon: Algernon Huxley, MD;  Location: [facility];  Service: Vascular;  Laterality: Right;  . DIALYSIS FISTULA CREATION Right 10/2017   right chest perm cath  . DIALYSIS/PERMA CATHETER REMOVAL N/A 09/13/2018   Procedure: DIALYSIS/PERMA CATHETER REMOVAL;  Surgeon: Algernon Huxley, MD;  Location: [place] [facility];  Service: Cardiovascular;  Laterality: N/A;  . ESOPHAGOGASTRODUODENOSCOPY N/A 12/19/2017   Procedure: ESOPHAGOGASTRODUODENOSCOPY (EGD);  Surgeon: Lin Landsman, MD;  Location: [facility];  Service: Gastroenterology;  Laterality: N/A;  . LOWER EXTREMITY ANGIOGRAPHY Left 11/16/2017   Procedure: LOWER EXTREMITY ANGIOGRAPHY;  Surgeon: Algernon Huxley, MD;  Location: [place] [facility];  Service: Cardiovascular;  Laterality: Left;  . LOWER EXTREMITY ANGIOGRAPHY Right 01/18/2018   Procedure: LOWER EXTREMITY ANGIOGRAPHY;  Surgeon: Algernon Huxley, MD;  Location: [place] [facility];  Service: Cardiovascular;  Laterality: Right;  . LOWER EXTREMITY ANGIOGRAPHY Left 04/02/2018   Procedure: LOWER EXTREMITY ANGIOGRAPHY;  Surgeon: Algernon Huxley, MD;  Location: [place] [facility];  Service: Cardiovascular;  Laterality: Left;  . LOWER EXTREMITY ANGIOGRAPHY Right 04/09/2018   Procedure: Lower Extremity Angiography with possible intervention;  Surgeon: Algernon Huxley, MD;  Location: [place] [facility];  Service: Cardiovascular;  Laterality: Right;  . LOWER EXTREMITY ANGIOGRAPHY Right 07/23/2018   Procedure: Lower Extremity Angiography;  Surgeon: Algernon Huxley, MD;  Location: [place] [facility];  Service: Cardiovascular;  Laterality: Right;  . LOWER EXTREMITY ANGIOGRAPHY Right 09/13/2018   Procedure: LOWER EXTREMITY ANGIOGRAPHY;  Surgeon: Algernon Huxley, MD;  Location: [place] [facility];  Service: Cardiovascular;  Laterality: Right;  . LOWER  EXTREMITY VENOGRAPHY Right 09/13/2018   Procedure: LOWER EXTREMITY VENOGRAPHY;  Surgeon: Algernon Huxley, MD;  Location: [place] [facility];  Service: Cardiovascular;  Laterality: Right;  . PERIPHERAL VASCULAR CATHETERIZATION Left 09/01/2015   Procedure: A/V Shuntogram/Fistulagram;  Surgeon: Katha Cabal, MD;  Location: [place] [facility];  Service: Cardiovascular;  Laterality: Left;  . PERIPHERAL VASCULAR CATHETERIZATION N/A 09/30/2015   Procedure: A/V Shuntogram/Fistulagram with perm cathether removal;  Surgeon: Algernon Huxley, MD;  Location: [place] [facility];  Service: Cardiovascular;  Laterality: N/A;  . PERIPHERAL VASCULAR CATHETERIZATION Left 09/30/2015   Procedure: A/V Shunt Intervention;  Surgeon: Algernon Huxley, MD;  Location: [place] [facility];  Service: Cardiovascular;  Laterality: Left;  . PERIPHERAL VASCULAR CATHETERIZATION Left 12/03/2015   Procedure: Thrombectomy;  Surgeon: Algernon Huxley, MD;  Location: [place] [facility];  Service: Cardiovascular;  Laterality: Left;  . PERIPHERAL VASCULAR CATHETERIZATION Left 01/28/2016   Procedure: Thrombectomy;  Surgeon: Algernon Huxley, MD;  Location: [place] [facility];  Service: Cardiovascular;  Laterality: Left;  . PERIPHERAL VASCULAR CATHETERIZATION N/A 01/28/2016   Procedure: A/V Shuntogram/Fistulagram;  Surgeon: Algernon Huxley, MD;  Location: [place] [facility];  Service: Cardiovascular;  Laterality: N/A;  . SKIN SPLIT GRAFT Right 05/24/2018   Procedure: SKIN GRAFT SPLIT THICKNESS ( RIGHT CALF);  Surgeon: Algernon Huxley,  MD;  Location: [facility];  Service: Vascular;  Laterality: Right;  . UPPER EXTREMITY ANGIOGRAPHY  10/24/2018   Procedure: Upper Extremity Angiography;  Surgeon: Algernon Huxley, MD;  Location: [place] [facility];  Service: Cardiovascular;;  . UPPER EXTREMITY ANGIOGRAPHY Right 02/14/2019   Procedure: UPPER EXTREMITY ANGIOGRAPHY;  Surgeon: Algernon Huxley, MD;  Location: [place] [facility];  Service: Cardiovascular;  Laterality: Right;   . UPPER EXTREMITY ANGIOGRAPHY Right 04/20/2020   Procedure: UPPER EXTREMITY ANGIOGRAPHY;  Surgeon: Algernon Huxley, MD;  Location: [place] [facility];  Service: Cardiovascular;  Laterality: Right;  . WOUND DEBRIDEMENT Right 04/11/2018   Procedure: DEBRIDEMENT WOUND calf muscle and skin;  Surgeon: Algernon Huxley, MD;  Location: [facility];  Service: Vascular;  Laterality: Right;    Prior to Admission medications   Medication Sig Start Date End Date Taking? Authorizing Provider  allopurinol (ZYLOPRIM) 100 MG tablet Take 100 mg by mouth daily.    [provider]  amLODipine (NORVASC) 5 MG tablet Take 1 tablet (5 mg total) by mouth daily. 10/30/18   Mayo, Pete Pelt, MD  aspirin EC 81 MG tablet Take 1 tablet (81 mg total) by mouth daily. 04/20/20   Algernon Huxley, MD  atorvastatin (LIPITOR) 10 MG tablet Take 1 tablet by mouth daily. 11/23/17   [provider]  clonazePAM (KLONOPIN) 0.5 MG tablet Take 1 tablet (0.5 mg total) by mouth as needed. 07/26/18   Bettey Costa, MD  clopidogrel (PLAVIX) 75 MG tablet Take 1 tablet (75 mg total) by mouth daily. 04/02/18   Algernon Huxley, MD  folic acid-vitamin b complex-vitamin c-selenium-zinc (DIALYVITE) 3 MG TABS tablet Take 1 tablet by mouth daily.     [provider]  gabapentin (NEURONTIN) 100 MG capsule Take 3 capsules (300 mg total) by mouth 3 (three) times a week. After dialysis session 05/27/20   Duffy Bruce, MD  hydrALAZINE (APRESOLINE) 25 MG tablet Take 25 mg by mouth 2 (two) times daily. 11/26/19   [provider]  losartan (COZAAR) 100 MG tablet Take 100 mg by mouth daily.    [provider]  metoprolol tartrate (LOPRESSOR) 25 MG tablet Take 25 mg by mouth every evening.    [provider]  Multiple Vitamin (MULTIVITAMIN) LIQD Take 5 mLs by mouth daily.    [provider]  oxyCODONE (ROXICODONE) 5 MG immediate release tablet Take 1-2 tablets (5-10 mg total) by mouth every 6 (six) hours as needed for  severe pain (No more than 6 tabs daily). 05/26/20 05/26/21  Duffy Bruce, MD  sevelamer carbonate (RENVELA) 800 MG tablet Take 2,400 mg by mouth 3 (three) times daily with meals.  09/20/18   [provider]  SPS 15 GM/60ML suspension Take by mouth. Patient not taking: Reported on 05/21/2020 04/06/20   [provider]     Allergies Shellfish allergy  Family History  Problem Relation Age of Onset  . Hypertension Other   . Heart disease Other   . Diabetes Mother     Social History Social History   Tobacco Use  . Smoking status: Former Smoker    Types: Cigarettes    Quit date: 05/17/2005    Years since quitting: 15.0  . Smokeless tobacco: Never Used  Vaping Use  . Vaping Use: Never used  Substance Use Topics  . Alcohol use: No  . Drug use: No    Review of Systems  Constitutional: No fever/chills Eyes: No visual changes.  ENT: No sore  throat. Cardiovascular: Denies chest pain. Respiratory: Denies shortness of breath. Gastrointestinal: No abdominal pain.  Genitourinary: Negative for dysuria. Musculoskeletal: Negative for back pain. Skin: Negative for rash. Neurological: Negative for headaches or weakness   ____________________________________________   PHYSICAL EXAM:  VITAL SIGNS: ED Triage Vitals  Enc Vitals Group     BP 06/08/20 0856 (!) 169/41     Pulse Rate 06/08/20 0856 76     Resp 06/08/20 0856 18     Temp 06/08/20 0856 98.3 F (36.8 C)     Temp Source 06/08/20 0856 Oral     SpO2 06/08/20 0856 100 %     Weight 06/08/20 0701 72.6 kg (160 lb)     Height 06/08/20 0701 1.727 m ('5\' 8"'$ )     Head Circumference --      Peak Flow --      Pain Score 06/08/20 0700 0     Pain Loc --      Pain Edu? --      Excl. in [place]? --     Constitutional: Alert and oriented.  Eyes: Conjunctivae are normal.  Head: Atraumatic. Nose: No congestion/rhinnorhea. Mouth/Throat: Mucous membranes are moist.    Cardiovascular: Normal rate, regular rhythm. Grossly  normal heart sounds.  Good peripheral circulation. Respiratory: Normal respiratory effort.  No retractions. Lungs CTAB. Gastrointestinal: Soft and nontender. No distention.  No CVA tenderness.  Musculoskeletal: No lower extremity tenderness nor edema.  Warm and well perfused Neurologic:  Normal speech and language. No gross focal neurologic deficits are appreciated.  Skin:  Skin is warm, dry and intact. No rash noted. Psychiatric: Mood and affect are normal. Speech and behavior are normal.  ____________________________________________   LABS (all labs ordered are listed, but only abnormal results are displayed)  Labs Reviewed  CBC - Abnormal; Notable for the following components:      Result Value   RBC 3.59 (*)    Hemoglobin 11.2 (*)    HCT 36.4 (*)    MCV 101.4 (*)    All other components within normal limits  COMPREHENSIVE METABOLIC PANEL - Abnormal; Notable for the following components:   BUN 40 (*)    Creatinine, Ser 13.25 (*)    Calcium 11.8 (*)    AST 12 (*)    GFR, Estimated 4 (*)    All other components within normal limits  SARS CORONAVIRUS 2 (TAT 6-24 HRS)  URINALYSIS, COMPLETE (UACMP) WITH MICROSCOPIC  CBG MONITORING, ED   ____________________________________________  EKG  ED ECG REPORT I, Lavonia Drafts, the attending physician, personally viewed and interpreted this ECG.  Date: 06/08/2020  Rhythm: normal sinus rhythm QRS Axis: normal Intervals: AV block ST/T Wave abnormalities: normal Narrative Interpretation: no evidence of acute ischemia  ____________________________________________  RADIOLOGY  Chest x-ray viewed by me, no infiltrate or effusion ____________________________________________   PROCEDURES  Procedure(s) performed: No  Procedures   Critical Care performed: No ____________________________________________   INITIAL IMPRESSION / ASSESSMENT AND PLAN / ED COURSE  Pertinent labs & imaging results that were available during my  care of the patient were reviewed by me and considered in my medical decision making (see chart for details).  Patient presents with fatigue, improved now.  Overall quite well-appearing and in no acute distress.  Exam is unremarkable.  Lab work reviewed by me.  Patient did have dialysis on Friday he is compliant with dialysis.  He will schedule a make up.  We will send Covid swab, appropriate for outpatient follow-up, return precautions discussed  ____________________________________________   FINAL CLINICAL IMPRESSION(S) / ED DIAGNOSES  Final diagnoses:  Generalized weakness        Note:  This document was prepared using Dragon voice recognition software and may include unintentional dictation errors.   Lavonia Drafts, MD 06/08/20 [phone]

## 2020-06-08 NOTE — ED Notes (Signed)
Called lab

## 2020-06-08 NOTE — ED Triage Notes (Signed)
Patient brought in by ems from HD. Patient with complaint of feeling weak, decrease appetite and just feeling bad for a couple of weeks.

## 2020-06-09 ENCOUNTER — Ambulatory Visit: Payer: Medicare Other

## 2020-06-09 ENCOUNTER — Encounter
Admission: RE | Disposition: A | Discharge status: Home / Self Care | Payer: Self-pay | Source: Home / Self Care | Attending: Vascular Surgery

## 2020-06-09 ENCOUNTER — Encounter: Payer: Self-pay | Admitting: Vascular Surgery

## 2020-06-09 ENCOUNTER — Ambulatory Visit
Admission: RE | Admit: 2020-06-09 | Discharge: 2020-06-09 | Disposition: A | Discharge status: Home / Self Care | Payer: Medicare Other | Attending: Vascular Surgery | Admitting: Vascular Surgery

## 2020-06-09 ENCOUNTER — Other Ambulatory Visit (INDEPENDENT_AMBULATORY_CARE_PROVIDER_SITE_OTHER): Payer: Self-pay | Admitting: Nurse Practitioner

## 2020-06-09 DIAGNOSIS — N186 End stage renal disease: Secondary | ICD-10-CM | POA: Insufficient documentation

## 2020-06-09 DIAGNOSIS — N185 Chronic kidney disease, stage 5: Secondary | ICD-10-CM

## 2020-06-09 HISTORY — PX: DIALYSIS/PERMA CATHETER INSERTION: CATH118288

## 2020-06-09 LAB — POTASSIUM (ARMC VASCULAR LAB ONLY): Potassium (ARMC vascular lab): 5.3 — ABNORMAL HIGH (ref 3.5–5.1)

## 2020-06-09 SURGERY — DIALYSIS/PERMA CATHETER INSERTION
Anesthesia: Moderate Sedation

## 2020-06-09 MED ORDER — METHYLPREDNISOLONE SODIUM SUCC 125 MG IJ SOLR
INTRAMUSCULAR | Status: AC
  Filled 2020-06-09: qty 2

## 2020-06-09 MED ORDER — FENTANYL CITRATE (PF) 100 MCG/2ML IJ SOLN
INTRAMUSCULAR | Status: AC
  Filled 2020-06-09: qty 2

## 2020-06-09 MED ORDER — MIDAZOLAM HCL 2 MG/2ML IJ SOLN
INTRAMUSCULAR | Status: AC
  Filled 2020-06-09: qty 2

## 2020-06-09 MED ORDER — METHYLPREDNISOLONE SODIUM SUCC 125 MG IJ SOLR
125.0000 mg | Freq: Once | INTRAMUSCULAR | Status: AC | PRN
  Administered 2020-06-09: 125 mg via INTRAVENOUS

## 2020-06-09 MED ORDER — MIDAZOLAM HCL 2 MG/ML PO SYRP: 8.0000 mg | ORAL_SOLUTION | Freq: Once | ORAL | Status: DC | PRN

## 2020-06-09 MED ORDER — DIPHENHYDRAMINE HCL 50 MG/ML IJ SOLN
INTRAMUSCULAR | Status: AC
  Administered 2020-06-09: 50 mg via INTRAVENOUS
  Filled 2020-06-09: qty 1

## 2020-06-09 MED ORDER — SODIUM CHLORIDE 0.9 % IV SOLN: INTRAVENOUS | Status: DC

## 2020-06-09 MED ORDER — FAMOTIDINE 20 MG PO TABS
ORAL_TABLET | ORAL | Status: AC
  Administered 2020-06-09: 40 mg via ORAL
  Filled 2020-06-09: qty 2

## 2020-06-09 MED ORDER — DIPHENHYDRAMINE HCL 50 MG/ML IJ SOLN: 50.0000 mg | Freq: Once | INTRAMUSCULAR | Status: AC | PRN

## 2020-06-09 MED ORDER — LIDOCAINE-EPINEPHRINE (PF) 1 %-1:200000 IJ SOLN
INTRAMUSCULAR | Status: AC
  Filled 2020-06-09: qty 10

## 2020-06-09 MED ORDER — FENTANYL CITRATE (PF) 100 MCG/2ML IJ SOLN
INTRAMUSCULAR | Status: DC | PRN
  Administered 2020-06-09: 50 ug via INTRAVENOUS

## 2020-06-09 MED ORDER — MIDAZOLAM HCL 2 MG/2ML IJ SOLN
INTRAMUSCULAR | Status: DC | PRN
  Administered 2020-06-09: 1 mg via INTRAVENOUS

## 2020-06-09 MED ORDER — FAMOTIDINE 20 MG PO TABS: 40.0000 mg | ORAL_TABLET | Freq: Once | ORAL | Status: AC | PRN

## 2020-06-09 MED ORDER — CEFAZOLIN SODIUM-DEXTROSE 1-4 GM/50ML-% IV SOLN
1.0000 g | Freq: Once | INTRAVENOUS | Status: AC
  Administered 2020-06-09: 1 g via INTRAVENOUS

## 2020-06-09 SURGICAL SUPPLY — 10 items
CATH CANNON HEMO 15F 50CM (CATHETERS) ×2 IMPLANT
CATH CANNON HEMO 15FR 19 (HEMODIALYSIS SUPPLIES) IMPLANT
DERMABOND ADVANCED (GAUZE/BANDAGES/DRESSINGS) ×1
DERMABOND ADVANCED .7 DNX12 (GAUZE/BANDAGES/DRESSINGS) ×1 IMPLANT
PACK ANGIOGRAPHY (CUSTOM PROCEDURE TRAY) ×2 IMPLANT
SUT MNCRL 4-0 (SUTURE) ×1
SUT MNCRL 4-0 27XMFL (SUTURE) ×1
SUT PROLENE 0 CT 1 30 (SUTURE) ×2 IMPLANT
SUTURE MNCRL 4-0 27XMF (SUTURE) ×1 IMPLANT
TOWEL OR 17X26 4PK STRL BLUE (TOWEL DISPOSABLE) ×2 IMPLANT

## 2020-06-09 NOTE — Progress Notes (Signed)
Pt. Significant. Other Daphane Shepherd phoned & made aware of pt. Status. DC instructions reviewed with pt. & S.O. with verbalized understanding. [facility] transport service called for pt. Ride home. Pt. S.O. to be at pt. Home & will stay with pt. Tonight. Pt. Stable for DC home.

## 2020-06-09 NOTE — Op Note (Signed)
OPERATIVE NOTE    PRE-OPERATIVE DIAGNOSIS: 1. ESRD  POST-OPERATIVE DIAGNOSIS: same as above  PROCEDURE: 1. Ultrasound guidance for vascular access to the right femoral  vein 2. Fluoroscopic guidance for placement of catheter 3. Placement of a 55 cm tip to cuff tunneled hemodialysis catheter via the left femoral vein  SURGEON: Leotis Pain, MD  ANESTHESIA:  Local with moderate conscious sedation for approximately 17 minutes using 1 mg of Versed and 50 mcg of Fentanyl  ESTIMATED BLOOD LOSS: 5 cc  FINDING(S): 1.  Patent left femoral vein   SPECIMEN(S):  None  INDICATIONS:   Patient is a 70 y.o.male who presents with renal failure and a failed access in an arm with severe steal in which we were going to ligate the graft to treat that anyway.   The patient needs long term dialysis access for their ESRD, and a Permcath is necessary.  Risks and benefits are discussed and informed consent is obtained.    DESCRIPTION: After obtaining full informed written consent, the patient was brought back to the vascular suited. Moderate conscious sedation was administered throughout the procedure with a face-to-face encounter with my presence for the entire procedure and with my supervision of the RN monitoring the patient's vital signs, pulse oximetry, telemetry, and mental status throughout the procedure.  The patient has a previous stent in his right subclavian and superior vena cava which precluded right jugular approach and may limit left jugular approach as well.  The patient's left groin was sterilely prepped and draped and a sterile surgical field was created.  The left femoral vein was visualized with ultrasound and found to be patent. It was then accessed under direct ultrasound guidance and a permanent image was recorded. A wire was placed. After skin nick and dilatation, the peel-away sheath was placed over the wire. I then turned my attention to an area about 5 cm inferior and lateral to the access  incision and a small counterincision was created.  I tunneled from the counter  incision to the access site. Using fluoroscopic guidance, a 60 centimeterer tip to cuff tunneled hemodialysis catheter was selected, and tunneled from the counter  incision to the access site. It was then placed through the peel-away sheath and the peel-away sheath was removed. Using fluoroscopic guidance the catheter tips were parked in the vena cava just below the right atrium. The appropriate distal connectors were placed. It withdrew blood well and flushed easily with heparinized saline and a concentrated heparin solution was then placed. It was secured to the leg  with 2 Prolene sutures. The access incision was closed single 4-0 Monocryl. A 4-0 Monocryl pursestring suture was placed around the exit site. Sterile dressings were placed. The patient tolerated the procedure well and was taken to the recovery room in stable condition.  COMPLICATIONS: None  CONDITION: Stable    Leotis Pain 06/09/2020 1:39 PM   This note was created with Dragon Medical transcription system. Any errors in dictation are purely unintentional.

## 2020-06-10 ENCOUNTER — Encounter: Payer: Self-pay | Admitting: Vascular Surgery

## 2020-06-11 ENCOUNTER — Ambulatory Visit: Payer: Medicare Other

## 2020-06-16 ENCOUNTER — Ambulatory Visit: Payer: Medicare Other

## 2020-06-17 NOTE — H&P (Signed)
[facility] History & Physical Update  The patient was interviewed and re-examined. See clinic note from 1/4 for full details. The patient's previous History and Physical has been reviewed and is unchanged.  There is no change in the plan of care. We plan to proceed with the scheduled procedure.  Leotis Pain, MD  06/17/2020, 2:09 PM

## 2020-06-18 ENCOUNTER — Ambulatory Visit: Payer: Medicare Other

## 2020-06-23 ENCOUNTER — Ambulatory Visit: Payer: Medicare Other

## 2020-06-24 ENCOUNTER — Inpatient Hospital Stay
Admission: EM | Admit: 2020-06-24 | Discharge: 2020-06-30 | DRG: 177 | Disposition: A | Discharge status: Home / Self Care | Payer: Medicare Other | Attending: Hospitalist | Admitting: Hospitalist

## 2020-06-24 ENCOUNTER — Other Ambulatory Visit: Payer: Self-pay

## 2020-06-24 ENCOUNTER — Emergency Department: Payer: Medicare Other

## 2020-06-24 DIAGNOSIS — N186 End stage renal disease: Secondary | ICD-10-CM | POA: Diagnosis present

## 2020-06-24 DIAGNOSIS — Z79899 Other long term (current) drug therapy: Secondary | ICD-10-CM

## 2020-06-24 DIAGNOSIS — Z91013 Allergy to seafood: Secondary | ICD-10-CM

## 2020-06-24 DIAGNOSIS — R778 Other specified abnormalities of plasma proteins: Secondary | ICD-10-CM

## 2020-06-24 DIAGNOSIS — E1151 Type 2 diabetes mellitus with diabetic peripheral angiopathy without gangrene: Secondary | ICD-10-CM | POA: Diagnosis present

## 2020-06-24 DIAGNOSIS — Z89511 Acquired absence of right leg below knee: Secondary | ICD-10-CM

## 2020-06-24 DIAGNOSIS — I959 Hypotension, unspecified: Secondary | ICD-10-CM | POA: Diagnosis present

## 2020-06-24 DIAGNOSIS — D631 Anemia in chronic kidney disease: Secondary | ICD-10-CM | POA: Diagnosis present

## 2020-06-24 DIAGNOSIS — G8929 Other chronic pain: Secondary | ICD-10-CM | POA: Diagnosis present

## 2020-06-24 DIAGNOSIS — F419 Anxiety disorder, unspecified: Secondary | ICD-10-CM | POA: Diagnosis present

## 2020-06-24 DIAGNOSIS — E785 Hyperlipidemia, unspecified: Secondary | ICD-10-CM | POA: Diagnosis present

## 2020-06-24 DIAGNOSIS — I2510 Atherosclerotic heart disease of native coronary artery without angina pectoris: Secondary | ICD-10-CM | POA: Diagnosis present

## 2020-06-24 DIAGNOSIS — I132 Hypertensive heart and chronic kidney disease with heart failure and with stage 5 chronic kidney disease, or end stage renal disease: Secondary | ICD-10-CM | POA: Diagnosis present

## 2020-06-24 DIAGNOSIS — U071 COVID-19: Secondary | ICD-10-CM | POA: Diagnosis not present

## 2020-06-24 DIAGNOSIS — Z7982 Long term (current) use of aspirin: Secondary | ICD-10-CM

## 2020-06-24 DIAGNOSIS — Z89611 Acquired absence of right leg above knee: Secondary | ICD-10-CM

## 2020-06-24 DIAGNOSIS — Z7902 Long term (current) use of antithrombotics/antiplatelets: Secondary | ICD-10-CM

## 2020-06-24 DIAGNOSIS — Z992 Dependence on renal dialysis: Secondary | ICD-10-CM

## 2020-06-24 DIAGNOSIS — N2581 Secondary hyperparathyroidism of renal origin: Secondary | ICD-10-CM | POA: Diagnosis present

## 2020-06-24 DIAGNOSIS — Z87892 Personal history of anaphylaxis: Secondary | ICD-10-CM

## 2020-06-24 DIAGNOSIS — E1122 Type 2 diabetes mellitus with diabetic chronic kidney disease: Secondary | ICD-10-CM | POA: Diagnosis present

## 2020-06-24 DIAGNOSIS — I252 Old myocardial infarction: Secondary | ICD-10-CM

## 2020-06-24 DIAGNOSIS — M109 Gout, unspecified: Secondary | ICD-10-CM | POA: Diagnosis present

## 2020-06-24 DIAGNOSIS — Z833 Family history of diabetes mellitus: Secondary | ICD-10-CM

## 2020-06-24 DIAGNOSIS — R0602 Shortness of breath: Secondary | ICD-10-CM | POA: Diagnosis not present

## 2020-06-24 DIAGNOSIS — Z8249 Family history of ischemic heart disease and other diseases of the circulatory system: Secondary | ICD-10-CM

## 2020-06-24 DIAGNOSIS — S78111A Complete traumatic amputation at level between right hip and knee, initial encounter: Secondary | ICD-10-CM

## 2020-06-24 DIAGNOSIS — M549 Dorsalgia, unspecified: Secondary | ICD-10-CM | POA: Diagnosis present

## 2020-06-24 DIAGNOSIS — R0600 Dyspnea, unspecified: Secondary | ICD-10-CM | POA: Diagnosis present

## 2020-06-24 DIAGNOSIS — I953 Hypotension of hemodialysis: Secondary | ICD-10-CM | POA: Diagnosis present

## 2020-06-24 DIAGNOSIS — Z87891 Personal history of nicotine dependence: Secondary | ICD-10-CM

## 2020-06-24 DIAGNOSIS — I429 Cardiomyopathy, unspecified: Secondary | ICD-10-CM | POA: Diagnosis present

## 2020-06-24 DIAGNOSIS — E1140 Type 2 diabetes mellitus with diabetic neuropathy, unspecified: Secondary | ICD-10-CM | POA: Diagnosis present

## 2020-06-24 DIAGNOSIS — I5032 Chronic diastolic (congestive) heart failure: Secondary | ICD-10-CM | POA: Diagnosis present

## 2020-06-24 LAB — CBC WITH DIFFERENTIAL/PLATELET
Abs Immature Granulocytes: 0.03 10*3/uL (ref 0.00–0.07)
Basophils Absolute: 0.1 10*3/uL (ref 0.0–0.1)
Basophils Relative: 1 %
Eosinophils Absolute: 0.2 10*3/uL (ref 0.0–0.5)
Eosinophils Relative: 2 %
HCT: 36.7 % — ABNORMAL LOW (ref 39.0–52.0)
Hemoglobin: 11.4 g/dL — ABNORMAL LOW (ref 13.0–17.0)
Immature Granulocytes: 0 %
Lymphocytes Relative: 20 %
Lymphs Abs: 1.9 10*3/uL (ref 0.7–4.0)
MCH: 31.7 pg (ref 26.0–34.0)
MCHC: 31.1 g/dL (ref 30.0–36.0)
MCV: 101.9 fL — ABNORMAL HIGH (ref 80.0–100.0)
Monocytes Absolute: 0.8 10*3/uL (ref 0.1–1.0)
Monocytes Relative: 9 %
Neutro Abs: 6.3 10*3/uL (ref 1.7–7.7)
Neutrophils Relative %: 68 %
Platelets: 193 10*3/uL (ref 150–400)
RBC: 3.6 MIL/uL — ABNORMAL LOW (ref 4.22–5.81)
RDW: 14.5 % (ref 11.5–15.5)
WBC: 9.3 10*3/uL (ref 4.0–10.5)
nRBC: 0 % (ref 0.0–0.2)

## 2020-06-24 LAB — TROPONIN I (HIGH SENSITIVITY): Troponin I (High Sensitivity): 26 ng/L — ABNORMAL HIGH (ref <18)

## 2020-06-24 LAB — LACTIC ACID, PLASMA: Lactic Acid, Venous: 1.2 mmol/L (ref 0.5–1.9)

## 2020-06-24 LAB — BRAIN NATRIURETIC PEPTIDE: B Natriuretic Peptide: 32.7 pg/mL (ref 0.0–100.0)

## 2020-06-24 MED ORDER — LACTATED RINGERS IV BOLUS
500.0000 mL | Freq: Once | INTRAVENOUS | Status: AC
  Administered 2020-06-24: 500 mL via INTRAVENOUS

## 2020-06-24 MED ORDER — LACTATED RINGERS IV BOLUS
250.0000 mL | Freq: Once | INTRAVENOUS | Status: AC
  Administered 2020-06-24: 250 mL via INTRAVENOUS

## 2020-06-24 NOTE — ED Triage Notes (Signed)
Pt in with co shob that started after dialysis today. No shob noted at this time, states has normal treatment.

## 2020-06-24 NOTE — ED Provider Notes (Signed)
[facility] Emergency Department Provider Note  ____________________________________________   Event Date/Time   First MD Initiated Contact with Patient 06/24/20 2211     (approximate)  I have reviewed the triage vital signs and the nursing notes.   HISTORY  Chief Complaint Shortness of Breath   HPI Johnny [name]. is a 70 y.o. male with a past medical history of CHF, ESRD on HD MWF, and completed a full session today, HTN, HPL, CAD, anemia, anxiety, and DM as well as PVD status post right BKA who presents via EMS from home for assessment of some shortness of breath nausea but developed after he got home from dialysis today.  He states that prior to dialysis he was feeling like in his usual state of health without any other recent fevers, chills, cough, nausea, vomiting, diarrhea, abdominal pain, chest pain rash or extremity pain.  He has he has chronic abdominal pain and has not changed at all today and has not had any recent falls or injuries.  Denies any other clear alleviating or aggravating factors.  He notes he recently had a fistula placed in his left groin about 3 weeks ago for access as his upper extremity fistula is not working.  He denies any other acute complaints at this time.         Past Medical History:  Diagnosis Date  . Anemia   . Anxiety   . CHF (congestive heart failure) ([place])   . Chronic kidney disease    esrd dialysis m/w/f  . Gout   . Hyperlipidemia   . Hypertension   . Myocardial infarction ([place]) 2010   10 years ago  . 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  . Peripheral vascular disease [facility])     Patient Active Problem List   Diagnosis Date Noted  . Cardiomyopathy ([place]) 01/22/2019  . CHF (congestive heart failure) ([place]) 01/22/2019  . Coronary disease 01/22/2019  . Above knee amputation of right lower extremity ([place]) 11/13/2018  . Leg ulcer, right, with fat layer exposed ([place] )  10/22/2018  . Cellulitis of right leg 07/21/2018  . Lower limb ulcer, calf, right, limited to breakdown of skin ([place]) 06/08/2018  . PVD (peripheral vascular disease) ([place]) 04/17/2018  . Malnutrition of moderate degree 04/11/2018  . Pressure injury of skin 04/06/2018  . Altered mental status 04/04/2018  . Hypothermia 04/04/2018  . Hemodialysis graft malfunction ([place]) 03/26/2018  . Hypercholesterolemia 02/27/2018  . Diabetes ([place]) 02/27/2018  . Weakness of right lower extremity 01/20/2018  . Fever   . Periumbilical abdominal pain   . Confusion 12/22/2017  . Acute delirium 12/21/2017  . Protein-calorie malnutrition, severe 12/19/2017  . Intractable nausea and vomiting 12/18/2017  . Lymphedema 12/13/2017  . Cellulitis 11/27/2017  . Chest pain 11/19/2017  . Atherosclerosis of native arteries of the extremities with ulceration ([place]) 11/07/2017  . Twitching 01/03/2017  . Elevated troponin 10/02/2015  . Complications, dialysis, catheter, mechanical ([place]) 10/02/2015  . Musculoskeletal chest pain 09/28/2015  . Chronic diastolic CHF (congestive heart failure) ([place]) 09/28/2015  . End stage renal disease ([place]) 10/09/2012  . Hypertension 10/09/2012  . Gout 10/09/2012    Past Surgical History:  Procedure Laterality Date  . A/V FISTULAGRAM Right 09/06/2018   Procedure: A/V FISTULAGRAM;  Surgeon: Algernon Huxley, MD;  Location: [place] [facility];  Service: Cardiovascular;  Laterality: Right;  . A/V SHUNTOGRAM Left 06/21/2017   Procedure: A/V SHUNTOGRAM;  Surgeon: Katha Cabal, MD;  Location: [place] [facility];  Service: Cardiovascular;  Laterality: Left;  . A/V SHUNTOGRAM N/A 10/24/2018   Procedure: A/V SHUNTOGRAM;  Surgeon: Algernon Huxley, MD;  Location: [place] [facility];  Service: Cardiovascular;  Laterality: N/A;  . ABOVE KNEE LEG AMPUTATION Right 2020  . AMPUTATION Right 10/25/2018   Procedure: AMPUTATION ABOVE KNEE;  Surgeon: Algernon Huxley, MD;  Location: [facility] ORS;  Service:  General;  Laterality: Right;  . APPLICATION OF WOUND VAC Right 04/11/2018   Procedure: APPLICATION OF WOUND VAC;  Surgeon: Algernon Huxley, MD;  Location: [facility] OR[facility];  Service: Vascular;  Laterality: Right;  . AV FISTULA PLACEMENT Left 09/18/2015   Procedure: INSERTION OF ARTERIOVENOUS (AV) GORE-TEX GRAFT ARM ( BRACH/AXILLARY GRAFT W/ INSTANT STICK GRAFT );  Surgeon: Katha Cabal, MD;  Location: [facility];  Service: Vascular;  Laterality: Left;  . AV FISTULA PLACEMENT Right 07/19/2018   Procedure: INSERTION OF ARTERIOVENOUS (AV) GORE-TEX GRAFT ARM ( BRACHIAL AXILLARY);  Surgeon: Algernon Huxley, MD;  Location: [facility];  Service: Vascular;  Laterality: Right;  . DIALYSIS FISTULA CREATION Right 10/2017   right chest perm cath  . DIALYSIS/PERMA CATHETER INSERTION N/A 06/09/2020   Procedure: DIALYSIS/PERMA CATHETER INSERTION;  Surgeon: Algernon Huxley, MD;  Location: [place] [facility];  Service: Cardiovascular;  Laterality: N/A;  . DIALYSIS/PERMA CATHETER REMOVAL N/A 09/13/2018   Procedure: DIALYSIS/PERMA CATHETER REMOVAL;  Surgeon: Algernon Huxley, MD;  Location: [place] [facility];  Service: Cardiovascular;  Laterality: N/A;  . ESOPHAGOGASTRODUODENOSCOPY N/A 12/19/2017   Procedure: ESOPHAGOGASTRODUODENOSCOPY (EGD);  Surgeon: Lin Landsman, MD;  Location: [facility] ENDOSCOPY;  Service: Gastroenterology;  Laterality: N/A;  . LOWER EXTREMITY ANGIOGRAPHY Left 11/16/2017   Procedure: LOWER EXTREMITY ANGIOGRAPHY;  Surgeon: Algernon Huxley, MD;  Location: [place] [facility];  Service: Cardiovascular;  Laterality: Left;  . LOWER EXTREMITY ANGIOGRAPHY Right 01/18/2018   Procedure: LOWER EXTREMITY ANGIOGRAPHY;  Surgeon: Algernon Huxley, MD;  Location: [place] [facility];  Service: Cardiovascular;  Laterality: Right;  . LOWER EXTREMITY ANGIOGRAPHY Left 04/02/2018   Procedure: LOWER EXTREMITY ANGIOGRAPHY;  Surgeon: Algernon Huxley, MD;  Location: [place] [facility];  Service: Cardiovascular;  Laterality: Left;  . LOWER  EXTREMITY ANGIOGRAPHY Right 04/09/2018   Procedure: Lower Extremity Angiography with possible intervention;  Surgeon: Algernon Huxley, MD;  Location: [place] [facility];  Service: Cardiovascular;  Laterality: Right;  . LOWER EXTREMITY ANGIOGRAPHY Right 07/23/2018   Procedure: Lower Extremity Angiography;  Surgeon: Algernon Huxley, MD;  Location: [place] [facility];  Service: Cardiovascular;  Laterality: Right;  . LOWER EXTREMITY ANGIOGRAPHY Right 09/13/2018   Procedure: LOWER EXTREMITY ANGIOGRAPHY;  Surgeon: Algernon Huxley, MD;  Location: [place] [facility];  Service: Cardiovascular;  Laterality: Right;  . LOWER EXTREMITY VENOGRAPHY Right 09/13/2018   Procedure: LOWER EXTREMITY VENOGRAPHY;  Surgeon: Algernon Huxley, MD;  Location: [place] [facility];  Service: Cardiovascular;  Laterality: Right;  . PERIPHERAL VASCULAR CATHETERIZATION Left 09/01/2015   Procedure: A/V Shuntogram/Fistulagram;  Surgeon: Katha Cabal, MD;  Location: [place] [facility];  Service: Cardiovascular;  Laterality: Left;  . PERIPHERAL VASCULAR CATHETERIZATION N/A 09/30/2015   Procedure: A/V Shuntogram/Fistulagram with perm cathether removal;  Surgeon: Algernon Huxley, MD;  Location: [place] [facility];  Service: Cardiovascular;  Laterality: N/A;  . PERIPHERAL VASCULAR CATHETERIZATION Left 09/30/2015   Procedure: A/V Shunt Intervention;  Surgeon: Algernon Huxley, MD;  Location: [place] [facility];  Service: Cardiovascular;  Laterality:  Left;  . PERIPHERAL VASCULAR CATHETERIZATION Left 12/03/2015   Procedure: Thrombectomy;  Surgeon: Algernon Huxley, MD;  Location: [place] [facility];  Service: Cardiovascular;  Laterality: Left;  . PERIPHERAL VASCULAR CATHETERIZATION Left 01/28/2016   Procedure: Thrombectomy;  Surgeon: Algernon Huxley, MD;  Location: [place] [facility];  Service: Cardiovascular;  Laterality: Left;  . PERIPHERAL VASCULAR CATHETERIZATION N/A 01/28/2016   Procedure: A/V Shuntogram/Fistulagram;  Surgeon: Algernon Huxley, MD;  Location:  [place] [facility];  Service: Cardiovascular;  Laterality: N/A;  . SKIN SPLIT GRAFT Right 05/24/2018   Procedure: SKIN GRAFT SPLIT THICKNESS ( RIGHT CALF);  Surgeon: Algernon Huxley, MD;  Location: [facility] ORS;  Service: Vascular;  Laterality: Right;  . UPPER EXTREMITY ANGIOGRAPHY  10/24/2018   Procedure: Upper Extremity Angiography;  Surgeon: Algernon Huxley, MD;  Location: [place] [facility];  Service: Cardiovascular;;  . UPPER EXTREMITY ANGIOGRAPHY Right 02/14/2019   Procedure: UPPER EXTREMITY ANGIOGRAPHY;  Surgeon: Algernon Huxley, MD;  Location: [place] [facility];  Service: Cardiovascular;  Laterality: Right;  . UPPER EXTREMITY ANGIOGRAPHY Right 04/20/2020   Procedure: UPPER EXTREMITY ANGIOGRAPHY;  Surgeon: Algernon Huxley, MD;  Location: [place] [facility];  Service: Cardiovascular;  Laterality: Right;  . WOUND DEBRIDEMENT Right 04/11/2018   Procedure: DEBRIDEMENT WOUND calf muscle and skin;  Surgeon: Algernon Huxley, MD;  Location: [facility] OR[facility];  Service: Vascular;  Laterality: Right;    Prior to Admission medications   Medication Sig Start Date End Date Taking? Authorizing Provider  allopurinol (ZYLOPRIM) 100 MG tablet Take 100 mg by mouth daily.    [provider]  amLODipine (NORVASC) 5 MG tablet Take 1 tablet (5 mg total) by mouth daily. 10/30/18   Mayo, Pete Pelt, MD  aspirin EC 81 MG tablet Take 1 tablet (81 mg total) by mouth daily. 04/20/20   Algernon Huxley, MD  atorvastatin (LIPITOR) 10 MG tablet Take 1 tablet by mouth daily. 11/23/17   [provider]  clonazePAM (KLONOPIN) 0.5 MG tablet Take 1 tablet (0.5 mg total) by mouth as needed. 07/26/18   Bettey Costa, MD  clopidogrel (PLAVIX) 75 MG tablet Take 1 tablet (75 mg total) by mouth daily. 04/02/18   Algernon Huxley, MD  folic acid-vitamin b complex-vitamin c-selenium-zinc (DIALYVITE) 3 MG TABS tablet Take 1 tablet by mouth daily.     [provider]  gabapentin (NEURONTIN) 100 MG capsule Take 3 capsules (300 mg total) by  mouth 3 (three) times a week. After dialysis session 05/27/20   Duffy Bruce, MD  hydrALAZINE (APRESOLINE) 25 MG tablet Take 25 mg by mouth 2 (two) times daily. 11/26/19   [provider]  losartan (COZAAR) 100 MG tablet Take 100 mg by mouth daily.    [provider]  metoprolol tartrate (LOPRESSOR) 25 MG tablet Take 25 mg by mouth every evening.    [provider]  Multiple Vitamin (MULTIVITAMIN) LIQD Take 5 mLs by mouth daily.    [provider]  oxyCODONE (ROXICODONE) 5 MG immediate release tablet Take 1-2 tablets (5-10 mg total) by mouth every 6 (six) hours as needed for severe pain (No more than 6 tabs daily). 05/26/20 05/26/21  Duffy Bruce, MD  sevelamer carbonate (RENVELA) 800 MG tablet Take 2,400 mg by mouth 3 (three) times daily with meals.  09/20/18   [provider]  SPS 15 GM/60ML suspension Take by mouth. Patient not taking: Reported on 05/21/2020 04/06/20   [provider]  Allergies Shellfish allergy  Family History  Problem Relation Age of Onset  . Hypertension Other   . Heart disease Other   . Diabetes Mother     Social History Social History   Tobacco Use  . Smoking status: Former Smoker    Types: Cigarettes    Quit date: 05/17/2005    Years since quitting: 15.1  . Smokeless tobacco: Never Used  Vaping Use  . Vaping Use: Never used  Substance Use Topics  . Alcohol use: No  . Drug use: No    Review of Systems  Review of Systems  Constitutional: Negative for chills and fever.  HENT: Negative for sore throat.   Eyes: Negative for pain.  Respiratory: Positive for shortness of breath. Negative for cough and stridor.   Cardiovascular: Negative for chest pain.  Gastrointestinal: Positive for nausea. Negative for vomiting.  Musculoskeletal: Positive for back pain ( chronic).  Skin: Negative for rash.  Neurological: Negative for seizures, loss of consciousness and headaches.  Psychiatric/Behavioral:  Negative for suicidal ideas.  All other systems reviewed and are negative.     ____________________________________________   PHYSICAL EXAM:  VITAL SIGNS: ED Triage Vitals  Enc Vitals Group     BP      Pulse      Resp      Temp      Temp src      SpO2      Weight      Height      Head Circumference      Peak Flow      Pain Score      Pain Loc      Pain Edu?      Excl. in [place]?    Vitals:   06/24/20 2216 06/24/20 2234  BP: (!) 84/56 (!) 92/56  Pulse: 91 92  Resp: 20 19  Temp: 98.1 F (36.7 C)   SpO2: 98% 99%   Physical Exam Vitals and nursing note reviewed.  Constitutional:      Appearance: He is well-developed and well-nourished.  HENT:     Head: Normocephalic and atraumatic.     Right Ear: External ear normal.     Left Ear: External ear normal.     Nose: Nose normal.  Eyes:     Conjunctiva/sclera: Conjunctivae normal.  Cardiovascular:     Rate and Rhythm: Normal rate and regular rhythm.     Heart sounds: No murmur heard.   Pulmonary:     Effort: Pulmonary effort is normal. No respiratory distress.     Breath sounds: Normal breath sounds.  Abdominal:     Palpations: Abdomen is soft.     Tenderness: There is no abdominal tenderness.  Musculoskeletal:        General: No edema.     Cervical back: Neck supple.     Right Lower Extremity: Right leg is amputated below knee.  Skin:    General: Skin is warm and dry.     Capillary Refill: Capillary refill takes less than 2 seconds.  Neurological:     Mental Status: He is alert and oriented to person, place, and time.  Psychiatric:        Mood and Affect: Mood and affect and mood normal.      ____________________________________________   LABS (all labs ordered are listed, but only abnormal results are displayed)  Labs Reviewed  CBC WITH DIFFERENTIAL/PLATELET - Abnormal; Notable for the following components:      Result Value  RBC 3.60 (*)    Hemoglobin 11.4 (*)    HCT 36.7 (*)    MCV 101.9 (*)     All other components within normal limits  TROPONIN I (HIGH SENSITIVITY) - Abnormal; Notable for the following components:   Troponin I (High Sensitivity) 26 (*)    All other components within normal limits  RESP PANEL BY RT-PCR (FLU A&B, COVID) ARPGX2  BRAIN NATRIURETIC PEPTIDE  LACTIC ACID, PLASMA  COMPREHENSIVE METABOLIC PANEL  PROCALCITONIN   ____________________________________________  EKG  Sinus rhythm with a ventricular of 89, left bundle branch block and some nonspecific ST changes in aVL without other clear evidence of acute ischemia or other significant underlying arrhythmia. ____________________________________________  RADIOLOGY  ED MD interpretation: No overt focal consolidation, overt edema, large effusion, pneumothorax or any other clear acute process.  There is some chronic appearing central vascular congestion.  Official radiology report(s): DG Chest 1 View  Result Date: 06/24/2020 CLINICAL DATA:  Short of breath after dialysis EXAM: CHEST  1 VIEW COMPARISON:  06/08/2020 FINDINGS: Two frontal views of the chest demonstrate unremarkable cardiac silhouette. Radiopaque catheter is overlying the junction of the IVC and right atrium likely reflect an inferior dialysis catheter. There is mild central vascular congestion without airspace disease, effusion, or pneumothorax. Numerous vascular stents are again seen, stable. IMPRESSION: 1. Central vascular congestion without overt edema. 2. Superior extent of a dialysis catheter via IVC approach, tip overlying atriocaval junction. Electronically Signed   By: Randa Ngo M.D.   On: 06/24/2020 22:26    ____________________________________________   PROCEDURES  Procedure(s) performed (including Critical Care):  .1-3 Lead EKG Interpretation Performed by: Lucrezia Starch, MD Authorized by: Lucrezia Starch, MD     Interpretation: abnormal     ECG rate assessment: normal     Rhythm: sinus rhythm     Conduction:  abnormal       ____________________________________________   INITIAL IMPRESSION / ASSESSMENT AND PLAN / ED COURSE      Patient presents with above to history exam for assessment of some shortness of breath that started after dialysis today.  Endorses some chronic back pain but no other acute symptoms.  On arrival he is hypertensive with a BP of 84/56.  He otherwise has stable vital signs on room air.  Differential includes but is not limited to acute infectious process, hypovolemia secondary to excess dialysis removal of fluid versus decreased p.o. intake.  No history of recent GI losses.  No history of recent trauma.  Patient adamantly denies any recent injury or bleeding and have a low suspicion for hemorrhage although it is possible he may have lost some blood during dialysis..  Lower suspicion for cardiogenic etiology as patient denies any chest pain although EKG does show nonspecific left bundle branch block and initial troponin is elevated 26.  Suspect mild demand ischemia versus chronic elevation secondary to ESRD.  I will plan to trend this.  Chest x-ray obtained does not show evidence of overt pulmonary edema and patient does not appear hypoxic.  There is no evidence of pneumonia on chest x-ray and patient has no fever.  CBC obtained shows no leukocytosis and hemoglobin at baseline.  Lactic acid is 1.2 and overall given absence of fever or elevated white blood cell count or clear infectious symptoms or findings on exam I will lower suspicion for acute infectious process.  BNP is not elevated and given low blood pressure and absence of edema on x-ray or overload on exam I  have low suspicion for heart failure.  Suspect patient may have had slightly too much fluid taken off today.  I will plan to gently hydrate pending CMP.  If his blood pressure does improve and his CMP is unremarkable and he is feeling better on reassessment he can likely be safely discharged with plan for close outpatient  PCP follow-up.  Care patient signed over to Dr. Alfred Levins at approximately 2300.  Plan is to follow-up CMP and reassess patient and blood pressure after gentle hydration.         ____________________________________________   FINAL CLINICAL IMPRESSION(S) / ED DIAGNOSES  Final diagnoses:  Hypotension, unspecified hypotension type  ESRD (end stage renal disease) (HCC)  SOB (shortness of breath)  Troponin I above reference range    Medications  lactated ringers bolus 250 mL (has no administration in time range)  lactated ringers bolus 500 mL (500 mLs Intravenous New Bag/Given 06/24/20 2238)     ED Discharge Orders    None       Note:  This document was prepared using Dragon voice recognition software and may include unintentional dictation errors.   Lucrezia Starch, MD 06/24/20 [phone]

## 2020-06-25 ENCOUNTER — Ambulatory Visit: Payer: Medicare Other

## 2020-06-25 ENCOUNTER — Ambulatory Visit (INDEPENDENT_AMBULATORY_CARE_PROVIDER_SITE_OTHER): Payer: Medicare Other | Admitting: Nurse Practitioner

## 2020-06-25 ENCOUNTER — Encounter (INDEPENDENT_AMBULATORY_CARE_PROVIDER_SITE_OTHER): Payer: Medicare Other

## 2020-06-25 DIAGNOSIS — I953 Hypotension of hemodialysis: Secondary | ICD-10-CM

## 2020-06-25 DIAGNOSIS — U071 COVID-19: Secondary | ICD-10-CM

## 2020-06-25 DIAGNOSIS — I959 Hypotension, unspecified: Secondary | ICD-10-CM | POA: Diagnosis present

## 2020-06-25 DIAGNOSIS — I9589 Other hypotension: Secondary | ICD-10-CM | POA: Diagnosis not present

## 2020-06-25 LAB — COMPREHENSIVE METABOLIC PANEL
ALT: 7 U/L (ref 0–44)
AST: 13 U/L — ABNORMAL LOW (ref 15–41)
Albumin: 3.4 g/dL — ABNORMAL LOW (ref 3.5–5.0)
Alkaline Phosphatase: 51 U/L (ref 38–126)
Anion gap: 11 (ref 5–15)
BUN: 14 mg/dL (ref 8–23)
CO2: 25 mmol/L (ref 22–32)
Calcium: 8.8 mg/dL — ABNORMAL LOW (ref 8.9–10.3)
Chloride: 102 mmol/L (ref 98–111)
Creatinine, Ser: 6.54 mg/dL — ABNORMAL HIGH (ref 0.61–1.24)
GFR, Estimated: 8 mL/min — ABNORMAL LOW (ref >60)
Glucose, Bld: 95 mg/dL (ref 70–99)
Potassium: 3.9 mmol/L (ref 3.5–5.1)
Sodium: 138 mmol/L (ref 135–145)
Total Bilirubin: 0.4 mg/dL (ref 0.3–1.2)
Total Protein: 6.7 g/dL (ref 6.5–8.1)

## 2020-06-25 LAB — RESP PANEL BY RT-PCR (FLU A&B, COVID) ARPGX2
Influenza A by PCR: NEGATIVE
Influenza B by PCR: NEGATIVE
SARS Coronavirus 2 by RT PCR: POSITIVE — AB

## 2020-06-25 LAB — D-DIMER, QUANTITATIVE: D-Dimer, Quant: 2.84 ug/mL-FEU — ABNORMAL HIGH (ref 0.00–0.50)

## 2020-06-25 LAB — PROCALCITONIN: Procalcitonin: 3.38 ng/mL

## 2020-06-25 LAB — TROPONIN I (HIGH SENSITIVITY): Troponin I (High Sensitivity): 25 ng/L — ABNORMAL HIGH (ref <18)

## 2020-06-25 MED ORDER — HYDROCOD POLST-CPM POLST ER 10-8 MG/5ML PO SUER: 5.0000 mL | Freq: Two times a day (BID) | ORAL | Status: DC | PRN

## 2020-06-25 MED ORDER — HEPARIN SODIUM (PORCINE) 5000 UNIT/ML IJ SOLN
5000.0000 [IU] | Freq: Three times a day (TID) | INTRAMUSCULAR | Status: DC
  Administered 2020-06-25 – 2020-06-30 (×15): 5000 [IU] via SUBCUTANEOUS
  Filled 2020-06-25 (×15): qty 1

## 2020-06-25 MED ORDER — ALBUTEROL SULFATE HFA 108 (90 BASE) MCG/ACT IN AERS
2.0000 | INHALATION_SPRAY | Freq: Four times a day (QID) | RESPIRATORY_TRACT | Status: DC
  Administered 2020-06-25 – 2020-06-30 (×18): 2 via RESPIRATORY_TRACT
  Filled 2020-06-25 (×2): qty 6.7

## 2020-06-25 MED ORDER — CHLORHEXIDINE GLUCONATE CLOTH 2 % EX PADS
6.0000 | MEDICATED_PAD | Freq: Every day | CUTANEOUS | Status: DC
  Administered 2020-06-26 – 2020-06-30 (×2): 6 via TOPICAL
  Filled 2020-06-25: qty 6

## 2020-06-25 MED ORDER — SODIUM CHLORIDE 0.9 % IV SOLN
100.0000 mg | Freq: Every day | INTRAVENOUS | Status: AC
  Administered 2020-06-26 – 2020-06-29 (×4): 100 mg via INTRAVENOUS
  Filled 2020-06-25: qty 20
  Filled 2020-06-25 (×3): qty 100

## 2020-06-25 MED ORDER — ASCORBIC ACID 500 MG PO TABS
500.0000 mg | ORAL_TABLET | Freq: Every day | ORAL | Status: DC
  Administered 2020-06-25 – 2020-06-29 (×5): 500 mg via ORAL
  Filled 2020-06-25 (×5): qty 1

## 2020-06-25 MED ORDER — ONDANSETRON HCL 4 MG/2ML IJ SOLN: 4.0000 mg | Freq: Four times a day (QID) | INTRAMUSCULAR | Status: DC | PRN

## 2020-06-25 MED ORDER — ACETAMINOPHEN 325 MG PO TABS
650.0000 mg | ORAL_TABLET | Freq: Four times a day (QID) | ORAL | Status: DC | PRN
  Administered 2020-06-26: 650 mg via ORAL
  Filled 2020-06-25: qty 2

## 2020-06-25 MED ORDER — ONDANSETRON HCL 4 MG PO TABS: 4.0000 mg | ORAL_TABLET | Freq: Four times a day (QID) | ORAL | Status: DC | PRN

## 2020-06-25 MED ORDER — SODIUM CHLORIDE 0.9 % IV SOLN
200.0000 mg | Freq: Once | INTRAVENOUS | Status: AC
  Administered 2020-06-25: 200 mg via INTRAVENOUS
  Filled 2020-06-25: qty 200

## 2020-06-25 MED ORDER — LACTATED RINGERS IV BOLUS
500.0000 mL | Freq: Once | INTRAVENOUS | Status: AC
  Administered 2020-06-25: 500 mL via INTRAVENOUS

## 2020-06-25 MED ORDER — ZINC SULFATE 220 (50 ZN) MG PO CAPS
220.0000 mg | ORAL_CAPSULE | Freq: Every day | ORAL | Status: DC
  Administered 2020-06-25 – 2020-06-29 (×5): 220 mg via ORAL
  Filled 2020-06-25 (×5): qty 1

## 2020-06-25 MED ORDER — SODIUM CHLORIDE 0.9% FLUSH
3.0000 mL | Freq: Two times a day (BID) | INTRAVENOUS | Status: DC
  Administered 2020-06-25 – 2020-06-29 (×9): 3 mL via INTRAVENOUS

## 2020-06-25 MED ORDER — GUAIFENESIN-DM 100-10 MG/5ML PO SYRP: 10.0000 mL | ORAL_SOLUTION | ORAL | Status: DC | PRN

## 2020-06-25 NOTE — Progress Notes (Signed)
[facility]  ROUNDING NOTE   Subjective:   [name] was admitted to [facility] on 06/24/2020 for Hypotension [I95.9]  Last hemodialysis treatment was yesterday, 2/16  Patient was diagnosed with COVID-19+   Objective:  Vital signs in last 24 hours:  Temp:  [98.1 F (36.7 C)-98.3 F (36.8 C)] 98.2 F (36.8 C) (02/17 1306) Pulse Rate:  [77-99] 91 (02/17 1306) Resp:  [13-26] 17 (02/17 1306) BP: (79-132)/(56-80) 90/69 (02/17 1306) SpO2:  [95 %-100 %] 98 % (02/17 1306) Weight:  [73.5 kg] 73.5 kg (02/16 2217)  Weight change:  Filed Weights   06/24/20 2217  Weight: 73.5 kg    Intake/Output: No intake/output data recorded.   Intake/Output this shift:  No intake/output data recorded.  Physical Exam: General: NAD,   Head: Normocephalic, atraumatic. Moist oral mucosal membranes  Eyes: Anicteric, PERRL  Neck: Supple, trachea midline  Lungs:  Clear to auscultation  Heart: Regular rate and rhythm  Abdomen:  Soft, nontender,   Extremities: no peripheral edema.  Neurologic: Nonfocal, moving all four extremities  Skin: No lesions  Access: Left femoral permcath    Basic Metabolic Panel: Recent Labs  Lab 06/24/20 2320  NA 138  K 3.9  CL 102  CO2 25  GLUCOSE 95  BUN 14  CREATININE 6.54*  CALCIUM 8.8*    Liver Function Tests: Recent Labs  Lab 06/24/20 2320  AST 13*  ALT 7  ALKPHOS 51  BILITOT 0.4  PROT 6.7  ALBUMIN 3.4*   No results for input(s): LIPASE, AMYLASE in the last 168 hours. No results for input(s): AMMONIA in the last 168 hours.  CBC: Recent Labs  Lab 06/24/20 2229  WBC 9.3  NEUTROABS 6.3  HGB 11.4*  HCT 36.7*  MCV 101.9*  PLT 193    Cardiac Enzymes: No results for input(s): CKTOTAL, CKMB, CKMBINDEX, TROPONINI in the last 168 hours.  BNP: Invalid input(s): POCBNP  CBG: No results for input(s): GLUCAP in the last 168 hours.  Microbiology: Results for orders placed or performed during the hospital encounter of  06/24/20  Resp Panel by RT-PCR (Flu A&B, Covid) Nasopharyngeal Swab     Status: Abnormal   Collection Time: 06/24/20 10:28 PM   Specimen: Nasopharyngeal Swab; Nasopharyngeal(NP) swabs in vial transport medium  Result Value Ref Range Status   SARS Coronavirus 2 by RT PCR POSITIVE (A) NEGATIVE Final    Comment: RESULT CALLED TO, READ BACK BY AND VERIFIED WITH: KANDELL SHEETS '@0001'$  ON 06/25/20 SKL (NOTE) SARS-CoV-2 target nucleic acids are DETECTED.  The SARS-CoV-2 RNA is generally detectable in upper respiratory specimens during the acute phase of infection. Positive results are indicative of the presence of the identified virus, but do not rule out bacterial infection or co-infection with other pathogens not detected by the test. Clinical correlation with patient history and other diagnostic information is necessary to determine patient infection status. The expected result is Negative.  Fact Sheet for Patients: EntrepreneurPulse.com.au  Fact Sheet for Healthcare Providers: IncredibleEmployment.be  This test is not yet approved or cleared by the [place] FDA and  has been authorized for detection and/or diagnosis of SARS-CoV-2 by FDA under an Emergency Use Authorization (EUA).  This EUA will remain in effect (meaning this test can  be used) for the duration of  the COVID-19 declaration under Section 564(b)(1) of the Act, 21 U.S.C. section 360bbb-3(b)(1), unless the authorization is terminated or revoked sooner.     Influenza A by PCR NEGATIVE NEGATIVE Final  Influenza B by PCR NEGATIVE NEGATIVE Final    Comment: (NOTE) The Xpert Xpress SARS-CoV-2/FLU/RSV plus assay is intended as an aid in the diagnosis of influenza from Nasopharyngeal swab specimens and should not be used as a sole basis for treatment. Nasal washings and aspirates are unacceptable for Xpert Xpress SARS-CoV-2/FLU/RSV testing.  Fact Sheet for  Patients: EntrepreneurPulse.com.au  Fact Sheet for Healthcare Providers: IncredibleEmployment.be  This test is not yet approved or cleared by the [place] FDA and has been authorized for detection and/or diagnosis of SARS-CoV-2 by FDA under an Emergency Use Authorization (EUA). This EUA will remain in effect (meaning this test can be used) for the duration of the COVID-19 declaration under Section 564(b)(1) of the Act, 21 U.S.C. section 360bbb-3(b)(1), unless the authorization is terminated or revoked.  Performed at [facility], [address] [place], [place] [address]   CULTURE, BLOOD (ROUTINE X 2) w Reflex to ID Panel     Status: None (Preliminary result)   Collection Time: 06/25/20  5:03 AM   Specimen: BLOOD  Result Value Ref Range Status   Specimen Description BLOOD RIGHT FA  Final   Special Requests   Final    BOTTLES DRAWN AEROBIC AND ANAEROBIC Blood Culture adequate volume   Culture   Final    NO GROWTH <12 HOURS Performed at [facility], [address] [place], [place] [address]    Report Status PENDING  Incomplete  CULTURE, BLOOD (ROUTINE X 2) w Reflex to ID Panel     Status: None (Preliminary result)   Collection Time: 06/25/20  5:03 AM   Specimen: BLOOD  Result Value Ref Range Status   Specimen Description BLOOD LEFT AC  Final   Special Requests   Final    BOTTLES DRAWN AEROBIC AND ANAEROBIC Blood Culture results may not be optimal due to an excessive volume of blood received in culture bottles   Culture   Final    NO GROWTH <12 HOURS Performed at [facility], [address] [place], [place] [address]    Report Status PENDING  Incomplete    Coagulation Studies: No results for input(s): LABPROT, INR in the last 72 hours.  Urinalysis: No results for input(s): COLORURINE, LABSPEC, PHURINE, GLUCOSEU, HGBUR, BILIRUBINUR, KETONESUR, PROTEINUR, UROBILINOGEN, NITRITE, LEUKOCYTESUR in the  last 72 hours.  Invalid input(s): APPERANCEUR    Imaging: DG Chest 1 View  Result Date: 06/24/2020 CLINICAL DATA:  Short of breath after dialysis EXAM: CHEST  1 VIEW COMPARISON:  06/08/2020 FINDINGS: Two frontal views of the chest demonstrate unremarkable cardiac silhouette. Radiopaque catheter is overlying the junction of the IVC and right atrium likely reflect an inferior dialysis catheter. There is mild central vascular congestion without airspace disease, effusion, or pneumothorax. Numerous vascular stents are again seen, stable. IMPRESSION: 1. Central vascular congestion without overt edema. 2. Superior extent of a dialysis catheter via IVC approach, tip overlying atriocaval junction. Electronically Signed   By: Randa Ngo M.D.   On: 06/24/2020 22:26     Medications:   . [START ON 06/26/2020] remdesivir 100 mg in NS 100 mL     . albuterol  2 puff Inhalation Q6H  . vitamin C  500 mg Oral Daily  . Chlorhexidine Gluconate Cloth  6 each Topical Q0600  . heparin  5,000 Units Subcutaneous Q8H  . zinc sulfate  220 mg Oral Daily   acetaminophen, chlorpheniramine-HYDROcodone, guaiFENesin-dextromethorphan, ondansetron **OR** ondansetron (ZOFRAN) IV  Assessment/ Plan:  Mr. Johansel Steiner. is a  70 y.o. black male with end stage renal disease on hemodialysis, peripheral vascular disease status post right AKA, coronary artery disease, hypertension, hyperlipidemia, congestive heart failure who is admitted to [facility] on 06/24/2020 for Hypotension [I95.9] Found to have COVID-19+ with cough and shortness of breath. Patient is vaccinated.   [facility] MWF [place] left femoral permcath 79kg  1. End Stage renal disease: MWF. Dialysis for tomorrow.   2. Hypotension: holding home regimen of hydralazine, metoprolol, losartan.   3. Anemia of chronic kidney disease: hemoglobin 11.4. macrocytic.  - EPO as outpatient.   4. Secondary Hyperparathyroidism:  - sevelamer with meals   LOS:  0 Rashelle Ireland 2/17/20221:23 PM

## 2020-06-25 NOTE — Progress Notes (Signed)
Remdesivir - Pharmacy Brief Note   O:  ALT: 7  CXR:  SpO2: 96 % on RA   A/P:  Remdesivir 200 mg IVPB once followed by 100 mg IVPB daily x 4 days.   Vandella Ord D 06/25/2020 3:35 AM

## 2020-06-25 NOTE — Progress Notes (Addendum)
PROGRESS NOTE    Johnny [name].  [id number] DOB: April 20, 1951 DOA: 06/24/2020 PCP: [name], MD    Assessment & Plan:   Active Problems:   ESRD on hemodialysis (HCC)   Chronic diastolic CHF (congestive heart failure) (HCC)   Above knee amputation of right lower extremity ([place])   COVID-19 virus infection   Hypotension of hemodialysis   Hypotension   Acute dyspnea: much improved. Likely secondary to [place]. CXR shows vascular congestion w/o over edema. Not requiring supplemental oxygen   COVID-19 virus infection: continue on IV remdesivir. Not currently hypoxic so will hold off of steroids at this time and continue to monitor closely.   Hypotension: etiology unclear. Keep MAP >65. Continue to hold metoprolol, losartan & hydralazine. Blood cxs are pending  ESRD: on HD. W/ left femoral vein tunneled dialysis catheter, placed on 06/09/20. Management per nephro   Chronic diastolic CHF: CXR shows vascular congestion but clinically appears euvolemic. Monitor I/Os. Daily weights  PAD: w/ hx of AKA of RLE. Continue on aspirin, statin  DVT prophylaxis: heparin  Code Status: full Family Communication: discussed pt's care w/ pt's MPOA, [name], and answered her questions Disposition Plan: depends on PT/OT recs.  Level of care: Progressive Cardiac   Status is: Observation  The patient remains OBS appropriate and will d/c before 2 midnights.  Dispo: The patient is from: Home              Anticipated d/c is to: SNF              Anticipated d/c date is: 3 days              Patient currently is not medically stable to d/c.   Difficult to place patient Yes    Consultants:     Procedures:    Antimicrobials:   Subjective: Pt c/o fatigue  Objective: Vitals:   06/25/20 0600 06/25/20 0615 06/25/20 0800 06/25/20 0815  BP: 132/68 101/80 105/72 123/75  Pulse: 86 88 88 97  Resp: (!) 24 (!) '25 14 18  '$ Temp:      TempSrc:      SpO2: 97% 98% 96% 100%  Weight:       Height:       No intake or output data in the 24 hours ending 06/25/20 0836 Filed Weights   06/24/20 2217  Weight: 73.5 kg    Examination:  General exam: Appears calm and comfortable  Respiratory system: decreased breath sounds b/l  Cardiovascular system: S1 & S2 +. No rubs, gallops or clicks. Gastrointestinal system: Abdomen is nondistended, soft and nontender.Normal bowel sounds heard. Central nervous system: Alert and oriented. Moves all extremities  Psychiatry: Judgement and insight appear normal. Flat mood and affect    Data Reviewed: I have personally reviewed following labs and imaging studies  CBC: Recent Labs  Lab 06/24/20 2229  WBC 9.3  NEUTROABS 6.3  HGB 11.4*  HCT 36.7*  MCV 101.9*  PLT 0000000   Basic Metabolic Panel: Recent Labs  Lab 06/24/20 2320  NA 138  K 3.9  CL 102  CO2 25  GLUCOSE 95  BUN 14  CREATININE 6.54*  CALCIUM 8.8*   GFR: Estimated Creatinine Clearance: 10 mL/min (A) (by C-G formula based on SCr of 6.54 mg/dL (H)). Liver Function Tests: Recent Labs  Lab 06/24/20 2320  AST 13*  ALT 7  ALKPHOS 51  BILITOT 0.4  PROT 6.7  ALBUMIN 3.4*   No results for input(s): LIPASE, AMYLASE in the  last 168 hours. No results for input(s): AMMONIA in the last 168 hours. Coagulation Profile: No results for input(s): INR, PROTIME in the last 168 hours. Cardiac Enzymes: No results for input(s): CKTOTAL, CKMB, CKMBINDEX, TROPONINI in the last 168 hours. BNP (last 3 results) No results for input(s): PROBNP in the last 8760 hours. HbA1C: No results for input(s): HGBA1C in the last 72 hours. CBG: No results for input(s): GLUCAP in the last 168 hours. Lipid Profile: No results for input(s): CHOL, HDL, LDLCALC, TRIG, CHOLHDL, LDLDIRECT in the last 72 hours. Thyroid Function Tests: No results for input(s): TSH, T4TOTAL, FREET4, T3FREE, THYROIDAB in the last 72 hours. Anemia Panel: No results for input(s): VITAMINB12, FOLATE, FERRITIN, TIBC,  IRON, RETICCTPCT in the last 72 hours. Sepsis Labs: Recent Labs  Lab 06/24/20 2229 06/24/20 2320  PROCALCITON  --  3.38  LATICACIDVEN 1.2  --     Recent Results (from the past 240 hour(s))  Resp Panel by RT-PCR (Flu A&B, Covid) Nasopharyngeal Swab     Status: Abnormal   Collection Time: 06/24/20 10:28 PM   Specimen: Nasopharyngeal Swab; Nasopharyngeal(NP) swabs in vial transport medium  Result Value Ref Range Status   SARS Coronavirus 2 by RT PCR POSITIVE (A) NEGATIVE Final    Comment: RESULT CALLED TO, READ BACK BY AND VERIFIED WITH: KANDELL SHEETS '@0001'$  ON 06/25/20 SKL (NOTE) SARS-CoV-2 target nucleic acids are DETECTED.  The SARS-CoV-2 RNA is generally detectable in upper respiratory specimens during the acute phase of infection. Positive results are indicative of the presence of the identified virus, but do not rule out bacterial infection or co-infection with other pathogens not detected by the test. Clinical correlation with patient history and other diagnostic information is necessary to determine patient infection status. The expected result is Negative.  Fact Sheet for Patients: EntrepreneurPulse.com.au  Fact Sheet for Healthcare Providers: IncredibleEmployment.be  This test is not yet approved or cleared by the [place] FDA and  has been authorized for detection and/or diagnosis of SARS-CoV-2 by FDA under an Emergency Use Authorization (EUA).  This EUA will remain in effect (meaning this test can  be used) for the duration of  the COVID-19 declaration under Section 564(b)(1) of the Act, 21 U.S.C. section 360bbb-3(b)(1), unless the authorization is terminated or revoked sooner.     Influenza A by PCR NEGATIVE NEGATIVE Final   Influenza B by PCR NEGATIVE NEGATIVE Final    Comment: (NOTE) The Xpert Xpress SARS-CoV-2/FLU/RSV plus assay is intended as an aid in the diagnosis of influenza from Nasopharyngeal swab specimens  and should not be used as a sole basis for treatment. Nasal washings and aspirates are unacceptable for Xpert Xpress SARS-CoV-2/FLU/RSV testing.  Fact Sheet for Patients: EntrepreneurPulse.com.au  Fact Sheet for Healthcare Providers: IncredibleEmployment.be  This test is not yet approved or cleared by the [place] FDA and has been authorized for detection and/or diagnosis of SARS-CoV-2 by FDA under an Emergency Use Authorization (EUA). This EUA will remain in effect (meaning this test can be used) for the duration of the COVID-19 declaration under Section 564(b)(1) of the Act, 21 U.S.C. section 360bbb-3(b)(1), unless the authorization is terminated or revoked.  Performed at [facility], [address] [place], [place] [address]   CULTURE, BLOOD (ROUTINE X 2) w Reflex to ID Panel     Status: None (Preliminary result)   Collection Time: 06/25/20  5:03 AM   Specimen: BLOOD  Result Value Ref Range Status   Specimen Description BLOOD RIGHT FA  Final  Special Requests   Final    BOTTLES DRAWN AEROBIC AND ANAEROBIC Blood Culture adequate volume   Culture   Final    NO GROWTH <12 HOURS Performed at [facility], [address] [place], [place] [address]    Report Status PENDING  Incomplete  CULTURE, BLOOD (ROUTINE X 2) w Reflex to ID Panel     Status: None (Preliminary result)   Collection Time: 06/25/20  5:03 AM   Specimen: BLOOD  Result Value Ref Range Status   Specimen Description BLOOD LEFT AC  Final   Special Requests   Final    BOTTLES DRAWN AEROBIC AND ANAEROBIC Blood Culture results may not be optimal due to an excessive volume of blood received in culture bottles   Culture   Final    NO GROWTH <12 HOURS Performed at [facility], [address] [place], [place] [address]    Report Status PENDING  Incomplete         Radiology Studies: DG Chest 1 View  Result Date: 06/24/2020 CLINICAL  DATA:  Short of breath after dialysis EXAM: CHEST  1 VIEW COMPARISON:  06/08/2020 FINDINGS: Two frontal views of the chest demonstrate unremarkable cardiac silhouette. Radiopaque catheter is overlying the junction of the IVC and right atrium likely reflect an inferior dialysis catheter. There is mild central vascular congestion without airspace disease, effusion, or pneumothorax. Numerous vascular stents are again seen, stable. IMPRESSION: 1. Central vascular congestion without overt edema. 2. Superior extent of a dialysis catheter via IVC approach, tip overlying atriocaval junction. Electronically Signed   By: Randa Ngo M.D.   On: 06/24/2020 22:26        Scheduled Meds: . albuterol  2 puff Inhalation Q6H  . vitamin C  500 mg Oral Daily  . Chlorhexidine Gluconate Cloth  6 each Topical Q0600  . heparin  5,000 Units Subcutaneous Q8H  . zinc sulfate  220 mg Oral Daily   Continuous Infusions: . [START ON 06/26/2020] remdesivir 100 mg in NS 100 mL       LOS: 0 days    Time spent: 33 mins    Wyvonnia Dusky, MD [facility] Pager [phone]  If 7PM-7AM, please contact night-coverage 06/25/2020, 8:36 AM

## 2020-06-25 NOTE — ED Notes (Signed)
Pt given sandwich tray and drink after pt stated he was hungry. Pt tolerating well.

## 2020-06-25 NOTE — ED Notes (Signed)
Johnny [name], Called and unable to provide pt with ride. Medical necessity completed for EMS transportation home.

## 2020-06-25 NOTE — Progress Notes (Signed)
Hemodialysis patient known at [facility] MWF, due to being [place] + patient will treat at [place] 12:45pm. Clinic stated they will call transportation to set it up once patient is discharge ready. Please contact me when ready to inform clinic.  Elvera Bicker Dialysis Coordinator [phone]

## 2020-06-25 NOTE — ED Notes (Signed)
Date and time results received: 06/25/20 0001 Test: Covid Critical Value: Positive  Name of Provider Notified: Alfred Levins MD

## 2020-06-25 NOTE — H&P (Signed)
History and Physical    Johnny [name]. WX:[id number] DOB: [birth year] DOA: 06/24/2020  PCP: [name], MD   Patient coming from: home  I have personally briefly reviewed patient's old medical records in [place]  Chief Complaint: Shortness of breath  HPI: Johnny [name]. is a 70 y.o. male with medical history significant for ESRD on HD MWF with recent placement on 06/09/2020 of left femoral vein tunneled dialysis catheter due to problems with AV fistula, as well as history of PAD status post right AKA, HTN, CAD, diastolic heart failure, who presents  for evaluation of shortness of breath. Patient was in his usual state of health  until he got home from dialysis on the day of arrival when he developed shortness of breath associated with a non productive cough. He denied chest pain, fever or chills. Denied vomiting, diarrhea or abdominal pain. ED Course: On arrival in the ED he was noted to be hypotensive at 84/56, with pulse of 91, afebrile, mild tachypnea of 20 with O2 sat 98% on room air. Blood work was for the most part unremarkable with WBC 9300, lactic acid 1.2, BNP 32, troponin 26>25. Procalcitonin was elevated at 3.38 but has been chronically elevated at 2-4 for the past few years. Covid positive, flu negative. EKG as reviewed by me : Sinus rhythm at 89 with no acute ST-T wave changes Imaging: Mild central vascular congestion without overt edema  Patient was given several gentle boluses of 250 mils to 500 mils for total of 1.25 L in the emergency room with only transient improvement in blood pressure. Was about to be discharged when recheck showed systolic in the [id number] at which point he was given an additional fluid bolus and hospitalist consulted for admission.  Review of Systems: As per HPI otherwise all other systems on review of systems negative.    Past Medical History:  Diagnosis Date  . Anemia   . Anxiety   . CHF (congestive heart failure) ([place])   . Chronic  kidney disease    esrd dialysis m/w/f  . Gout   . Hyperlipidemia   . Hypertension   . Myocardial infarction ([place]) 2010   10 years ago  . 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  . Peripheral vascular disease [facility])     Past Surgical History:  Procedure Laterality Date  . A/V FISTULAGRAM Right 09/06/2018   Procedure: A/V FISTULAGRAM;  Surgeon: Algernon Huxley, MD;  Location: [place] [facility];  Service: Cardiovascular;  Laterality: Right;  . A/V SHUNTOGRAM Left 06/21/2017   Procedure: A/V SHUNTOGRAM;  Surgeon: Katha Cabal, MD;  Location: [place] [facility];  Service: Cardiovascular;  Laterality: Left;  . A/V SHUNTOGRAM N/A 10/24/2018   Procedure: A/V SHUNTOGRAM;  Surgeon: Algernon Huxley, MD;  Location: [place] [facility];  Service: Cardiovascular;  Laterality: N/A;  . ABOVE KNEE LEG AMPUTATION Right 2020  . AMPUTATION Right 10/25/2018   Procedure: AMPUTATION ABOVE KNEE;  Surgeon: Algernon Huxley, MD;  Location: [facility];  Service: General;  Laterality: Right;  . APPLICATION OF WOUND VAC Right 04/11/2018   Procedure: APPLICATION OF WOUND VAC;  Surgeon: Algernon Huxley, MD;  Location: [facility];  Service: Vascular;  Laterality: Right;  . AV FISTULA PLACEMENT Left 09/18/2015   Procedure: INSERTION OF ARTERIOVENOUS (AV) GORE-TEX GRAFT ARM ( BRACH/AXILLARY GRAFT W/ INSTANT STICK GRAFT );  Surgeon: Katha Cabal, MD;  Location: [facility];  Service: Vascular;  Laterality: Left;  . AV FISTULA PLACEMENT Right 07/19/2018   Procedure: INSERTION OF ARTERIOVENOUS (AV) GORE-TEX GRAFT ARM ( BRACHIAL AXILLARY);  Surgeon: Algernon Huxley, MD;  Location: [facility];  Service: Vascular;  Laterality: Right;  . DIALYSIS FISTULA CREATION Right 10/2017   right chest perm cath  . DIALYSIS/PERMA CATHETER INSERTION N/A 06/09/2020   Procedure: DIALYSIS/PERMA CATHETER INSERTION;  Surgeon: Algernon Huxley, MD;  Location: [place] [facility];  Service: Cardiovascular;  Laterality: N/A;  .  DIALYSIS/PERMA CATHETER REMOVAL N/A 09/13/2018   Procedure: DIALYSIS/PERMA CATHETER REMOVAL;  Surgeon: Algernon Huxley, MD;  Location: [place] [facility];  Service: Cardiovascular;  Laterality: N/A;  . ESOPHAGOGASTRODUODENOSCOPY N/A 12/19/2017   Procedure: ESOPHAGOGASTRODUODENOSCOPY (EGD);  Surgeon: Lin Landsman, MD;  Location: [facility] END[facility];  Service: Gastroenterology;  Laterality: N/A;  . LOWER EXTREMITY ANGIOGRAPHY Left 11/16/2017   Procedure: LOWER EXTREMITY ANGIOGRAPHY;  Surgeon: Algernon Huxley, MD;  Location: [place] [facility];  Service: Cardiovascular;  Laterality: Left;  . LOWER EXTREMITY ANGIOGRAPHY Right 01/18/2018   Procedure: LOWER EXTREMITY ANGIOGRAPHY;  Surgeon: Algernon Huxley, MD;  Location: [place] [facility];  Service: Cardiovascular;  Laterality: Right;  . LOWER EXTREMITY ANGIOGRAPHY Left 04/02/2018   Procedure: LOWER EXTREMITY ANGIOGRAPHY;  Surgeon: Algernon Huxley, MD;  Location: [place] [facility];  Service: Cardiovascular;  Laterality: Left;  . LOWER EXTREMITY ANGIOGRAPHY Right 04/09/2018   Procedure: Lower Extremity Angiography with possible intervention;  Surgeon: Algernon Huxley, MD;  Location: [place] [facility];  Service: Cardiovascular;  Laterality: Right;  . LOWER EXTREMITY ANGIOGRAPHY Right 07/23/2018   Procedure: Lower Extremity Angiography;  Surgeon: Algernon Huxley, MD;  Location: [place] [facility];  Service: Cardiovascular;  Laterality: Right;  . LOWER EXTREMITY ANGIOGRAPHY Right 09/13/2018   Procedure: LOWER EXTREMITY ANGIOGRAPHY;  Surgeon: Algernon Huxley, MD;  Location: [place] [facility];  Service: Cardiovascular;  Laterality: Right;  . LOWER EXTREMITY VENOGRAPHY Right 09/13/2018   Procedure: LOWER EXTREMITY VENOGRAPHY;  Surgeon: Algernon Huxley, MD;  Location: [place] [facility];  Service: Cardiovascular;  Laterality: Right;  . PERIPHERAL VASCULAR CATHETERIZATION Left 09/01/2015   Procedure: A/V Shuntogram/Fistulagram;  Surgeon: Katha Cabal, MD;  Location: [place] [facility];  Service: Cardiovascular;  Laterality: Left;  . PERIPHERAL VASCULAR CATHETERIZATION N/A 09/30/2015   Procedure: A/V Shuntogram/Fistulagram with perm cathether removal;  Surgeon: Algernon Huxley, MD;  Location: [place] [facility];  Service: Cardiovascular;  Laterality: N/A;  . PERIPHERAL VASCULAR CATHETERIZATION Left 09/30/2015   Procedure: A/V Shunt Intervention;  Surgeon: Algernon Huxley, MD;  Location: [place] [facility];  Service: Cardiovascular;  Laterality: Left;  . PERIPHERAL VASCULAR CATHETERIZATION Left 12/03/2015   Procedure: Thrombectomy;  Surgeon: Algernon Huxley, MD;  Location: [place] [facility];  Service: Cardiovascular;  Laterality: Left;  . PERIPHERAL VASCULAR CATHETERIZATION Left 01/28/2016   Procedure: Thrombectomy;  Surgeon: Algernon Huxley, MD;  Location: [place] [facility];  Service: Cardiovascular;  Laterality: Left;  . PERIPHERAL VASCULAR CATHETERIZATION N/A 01/28/2016   Procedure: A/V Shuntogram/Fistulagram;  Surgeon: Algernon Huxley, MD;  Location: [place] [facility];  Service: Cardiovascular;  Laterality: N/A;  . SKIN SPLIT GRAFT Right 05/24/2018   Procedure: SKIN GRAFT SPLIT THICKNESS ( RIGHT CALF);  Surgeon: Algernon Huxley, MD;  Location: [facility];  Service: Vascular;  Laterality: Right;  . UPPER EXTREMITY ANGIOGRAPHY  10/24/2018   Procedure: Upper Extremity Angiography;  Surgeon: Algernon Huxley, MD;  Location: [place]  [facility];  Service: Cardiovascular;;  . UPPER EXTREMITY ANGIOGRAPHY Right 02/14/2019   Procedure: UPPER EXTREMITY ANGIOGRAPHY;  Surgeon: Algernon Huxley, MD;  Location: [place] [facility];  Service: Cardiovascular;  Laterality: Right;  . UPPER EXTREMITY ANGIOGRAPHY Right 04/20/2020   Procedure: UPPER EXTREMITY ANGIOGRAPHY;  Surgeon: Algernon Huxley, MD;  Location: [place] [facility];  Service: Cardiovascular;  Laterality: Right;  . WOUND DEBRIDEMENT Right 04/11/2018   Procedure: DEBRIDEMENT WOUND calf muscle and skin;  Surgeon: Algernon Huxley, MD;  Location:  [facility];  Service: Vascular;  Laterality: Right;     reports that he quit smoking about 15 years ago. His smoking use included cigarettes. He has never used smokeless tobacco. He reports that he does not drink alcohol and does not use drugs.  Allergies  Allergen Reactions  . Shellfish Allergy Anaphylaxis    Family History  Problem Relation Age of Onset  . Hypertension Other   . Heart disease Other   . Diabetes Mother       Prior to Admission medications   Medication Sig Start Date End Date Taking? Authorizing Provider  allopurinol (ZYLOPRIM) 100 MG tablet Take 100 mg by mouth daily.    [provider]  amLODipine (NORVASC) 5 MG tablet Take 1 tablet (5 mg total) by mouth daily. 10/30/18   Mayo, Pete Pelt, MD  aspirin EC 81 MG tablet Take 1 tablet (81 mg total) by mouth daily. 04/20/20   Algernon Huxley, MD  atorvastatin (LIPITOR) 10 MG tablet Take 1 tablet by mouth daily. 11/23/17   [provider]  clonazePAM (KLONOPIN) 0.5 MG tablet Take 1 tablet (0.5 mg total) by mouth as needed. 07/26/18   Bettey Costa, MD  clopidogrel (PLAVIX) 75 MG tablet Take 1 tablet (75 mg total) by mouth daily. 04/02/18   Algernon Huxley, MD  folic acid-vitamin b complex-vitamin c-selenium-zinc (DIALYVITE) 3 MG TABS tablet Take 1 tablet by mouth daily.     [provider]  gabapentin (NEURONTIN) 100 MG capsule Take 3 capsules (300 mg total) by mouth 3 (three) times a week. After dialysis session 05/27/20   Duffy Bruce, MD  hydrALAZINE (APRESOLINE) 25 MG tablet Take 25 mg by mouth 2 (two) times daily. 11/26/19   [provider]  losartan (COZAAR) 100 MG tablet Take 100 mg by mouth daily.    [provider]  metoprolol tartrate (LOPRESSOR) 25 MG tablet Take 25 mg by mouth every evening.    [provider]  Multiple Vitamin (MULTIVITAMIN) LIQD Take 5 mLs by mouth daily.    [provider]  oxyCODONE (ROXICODONE) 5 MG immediate release tablet Take 1-2  tablets (5-10 mg total) by mouth every 6 (six) hours as needed for severe pain (No more than 6 tabs daily). 05/26/20 05/26/21  Duffy Bruce, MD  sevelamer carbonate (RENVELA) 800 MG tablet Take 2,400 mg by mouth 3 (three) times daily with meals.  09/20/18   [provider]  SPS 15 GM/60ML suspension Take by mouth. Patient not taking: Reported on 05/21/2020 04/06/20   [provider]    Physical Exam: Vitals:   06/25/20 0232 06/25/20 0234 06/25/20 0245 06/25/20 0300  BP: (!) 82/71  (!) 79/69 97/74  Pulse: 92  95 91  Resp:    13  Temp:      TempSrc:      SpO2: 99% 99% 97% 96%  Weight:      Height:         Vitals:  06/25/20 0232 06/25/20 0234 06/25/20 0245 06/25/20 0300  BP: (!) 82/71  (!) 79/69 97/74  Pulse: 92  95 91  Resp:    13  Temp:      TempSrc:      SpO2: 99% 99% 97% 96%  Weight:      Height:          Constitutional: Alert and oriented x 3 . Not in any apparent distress HEENT:      Head: Normocephalic and atraumatic.         Eyes: PERLA, EOMI, Conjunctivae are normal. Sclera is non-icteric.       Mouth/Throat: Mucous membranes are moist.       Neck: Supple with no signs of meningismus. Cardiovascular: Regular rate and rhythm. No murmurs, gallops, or rubs. 2+ symmetrical distal pulses are present . No JVD. No LE edema.  Vascular access left groin appears intact without surrounding erythema Respiratory: Respiratory effort normal .Lungs sounds clear bilaterally. No wheezes, crackles, or rhonchi.  Gastrointestinal: Soft, non tender, and non distended with positive bowel sounds.  Genitourinary: No CVA tenderness. Musculoskeletal: Nontender with normal range of motion in all extremities.  Right AKA. No cyanosis, or erythema of extremities. Neurologic:  Face is symmetric. Moving all extremities. No gross focal neurologic deficits . Skin: Skin is warm, dry.  No rash or ulcers Psychiatric: Mood and affect are normal    Labs on Admission: I have  personally reviewed following labs and imaging studies  CBC: Recent Labs  Lab 06/24/20 2229  WBC 9.3  NEUTROABS 6.3  HGB 11.4*  HCT 36.7*  MCV 101.9*  PLT 0000000   Basic Metabolic Panel: Recent Labs  Lab 06/24/20 2320  NA 138  K 3.9  CL 102  CO2 25  GLUCOSE 95  BUN 14  CREATININE 6.54*  CALCIUM 8.8*   GFR: Estimated Creatinine Clearance: 10 mL/min (A) (by C-G formula based on SCr of 6.54 mg/dL (H)). Liver Function Tests: Recent Labs  Lab 06/24/20 2320  AST 13*  ALT 7  ALKPHOS 51  BILITOT 0.4  PROT 6.7  ALBUMIN 3.4*   No results for input(s): LIPASE, AMYLASE in the last 168 hours. No results for input(s): AMMONIA in the last 168 hours. Coagulation Profile: No results for input(s): INR, PROTIME in the last 168 hours. Cardiac Enzymes: No results for input(s): CKTOTAL, CKMB, CKMBINDEX, TROPONINI in the last 168 hours. BNP (last 3 results) No results for input(s): PROBNP in the last 8760 hours. HbA1C: No results for input(s): HGBA1C in the last 72 hours. CBG: No results for input(s): GLUCAP in the last 168 hours. Lipid Profile: No results for input(s): CHOL, HDL, LDLCALC, TRIG, CHOLHDL, LDLDIRECT in the last 72 hours. Thyroid Function Tests: No results for input(s): TSH, T4TOTAL, FREET4, T3FREE, THYROIDAB in the last 72 hours. Anemia Panel: No results for input(s): VITAMINB12, FOLATE, FERRITIN, TIBC, IRON, RETICCTPCT in the last 72 hours. Urine analysis: No results found for: COLORURINE, APPEARANCEUR, [place], [place], GLUCOSEU, [place], BILIRUBINUR, KETONESUR, PROTEINUR, UROBILINOGEN, NITRITE, LEUKOCYTESUR  Radiological Exams on Admission: DG Chest 1 View  Result Date: 06/24/2020 CLINICAL DATA:  Short of breath after dialysis EXAM: CHEST  1 VIEW COMPARISON:  06/08/2020 FINDINGS: Two frontal views of the chest demonstrate unremarkable cardiac silhouette. Radiopaque catheter is overlying the junction of the IVC and right atrium likely reflect an inferior dialysis  catheter. There is mild central vascular congestion without airspace disease, effusion, or pneumothorax. Numerous vascular stents are again seen, stable. IMPRESSION: 1. Central vascular congestion without  overt edema. 2. Superior extent of a dialysis catheter via IVC approach, tip overlying atriocaval junction. Electronically Signed   By: Randa Ngo M.D.   On: 06/24/2020 22:26     Assessment/Plan 70 year old male with history of ESRD on HD MWF with recent placement on 06/09/2020 of left femoral vein tunneled dialysis catheter due to problems with AV fistula, as well as history of PAD status post right AKA, HTN, CAD, diastolic heart failure, who presents  for evaluation of shortness of breath. Test positive for Covid. Hypotensive in the ER.  Acute dyspnea -Etiology suspect related to Covid, though in the setting of hypotension, recently placed left femoral catheter, vascular congestion on chest x-ray, differential concerning for other potential etiologies including CHF, PE, bacteremia, albeit low suspicion for same -For possible CHF exacerbation and Covid, treat as outlined below -For possible PE: Low suspicion as patient is not hypoxic but follow D-dimer (addendum: D-dimer elevated but still low clinical suspicion for PE.  Patient is not tachycardic or hypoxic but low threshold for rule out with CTA chest if symptoms worsen)  COVID-19 virus infection -Patient symptomatic for cough and shortness of breath -Remdesivir and antitussives. Albuterol as needed, vitamins -Patient is not currently hypoxic so no steroids indicated -Chest x-ray without Covid-typical findings    Hypotension, uncertain etiology -Hypotension, suspect related to dialysis -Low suspicion for hypotension related to sepsis but in view of femoral catheter placement on 2/1, will get blood cultures -Hydrate as tolerated, but monitoring for fluid overload -Consider Reds vest for fluid status monitoring    ESRD on hemodialysis  [facility]) -Nephrology consult for continuation of dialysis  Left femoral vein tunneled dialysis catheter -Placed on 2/1 -Follow blood culture    Chronic diastolic CHF with possible mild exacerbation (congestive heart failure) ([place]) -Patient presents with dyspnea, central pulmonary congestion on chest x-ray but with normal BNP of 32 -Clinically appears euvolemic -Being hydrated as needed to address hypotension -Intake and output monitoring and daily weights  PAD with history of AKA right lower extremity ([place]) -Continue aspirin and statins    DVT prophylaxis: Heparin Code Status: full code  Family Communication:  none  Disposition Plan: Back to previous home environment Consults called: Nephrology Status: Observation    Athena Masse MD [facility]     06/25/2020, 3:34 AM

## 2020-06-25 NOTE — ED Notes (Signed)
EMS at bedside to transport pt home. Pt blood pressure decreased. MD Alfred Levins notified. MD to bedside. EMS notified pt would no longer be discharged to home. Pt to be admitted to hospital due to blood pressure drop again.

## 2020-06-25 NOTE — Discharge Instructions (Signed)
[place]  Follow these instructions at home:  Protecting others To avoid spreading the illness to other people: Quarantine in your home until you have had no cough and fever for 7 days. Household members should also be quarantine for at least 14 days after being exposed to you. Wash your hands often with soap and water. If soap and water are not available, use an alcohol-based hand sanitizer. If you have not cleaned your hands, do not touch your face. Make sure that all people in your household wash their hands well and often. Cover your nose and mouth when you cough or sneeze. Throw away used tissues. Stay home if you have any cold-like or flu-like symptoms. General instructions Go to your local pharmacy and buy a pulse oximeter (this is a machine that measures your oxygen). Check your oxygen levels at least 3 times a day. If your oxygen level is 90% or less return to the emergency room immediately Take over-the-counter and prescription medicines only as told by your health care provider. If you need medication for fever take tylenol or ibuprofen Drink enough fluid to keep your urine pale yellow. Rest at home as directed by your health care provider. Do not give aspirin to a child with the flu, because of the association with Reye's syndrome. Do not use tobacco products, including cigarettes, chewing tobacco, and e-cigarettes. If you need help quitting, ask your health care provider. Keep all follow-up visits as told by your health care provider. This is important. How is this prevented? Avoid areas where an outbreak has been reported. Avoid large groups of people. Keep a safe distance from people who are coughing and sneezing. Do not touch your face if you have not cleaned your hands. When you are around people who are sick or might be sick, wear a mask to protect yourself. Contact a health care provider if: You have symptoms of SARS  (cough, fever, chest pain, shortness of breath) that are not getting better at home. You have a fever. If you have difficulty breathing go to your local ER or call 911   To help boost your immune system against COVID-19, please take:  - Vitamin D3 4,000 IU/day - Vitamin C 500-1,000?mg twice a day - Quercetin 250?mg twice a day - Zinc 100?mg/day - Melatonin 10?mg before bedtime (causes drowsiness) - Aspirin 325?mg/day (unless contraindicated) - Pulse Oximeter Monitoring of oxygen saturation is recommended - check your oxygen 3 times a day if less than 90% return to the ER  These medications are all over-the-counter and do not need a prescription.

## 2020-06-25 NOTE — ED Provider Notes (Addendum)
BP improved with IV hydration. Patient tested + Covid.  Patient has normal work of breathing, normal sats with no signs of hypoxia.  Labs with no indication for emergent dialysis.  EKG unchanged from baseline.  2 high-sensitivity troponin unchanged.  Procalcitonin is elevated however this seems to be a chronic finding for patient due to dialysis.  He has no fever, no white count, and a chest x-ray with no signs of bacterial pneumonia.  We will hold off antibiotics at this time.  Discussed immune system boosting, quarantine, and oxygen monitoring at home.  Discussed my standard return precautions.  Will refer patient to monoclonal antibodies.   _________________________ 2:42 AM on 06/25/2020 -----------------------------------------   EMS is now here for transport and patient's BP is dropping again. BP was 132/73 and now 82/71. Cuff adjusted and pressure taken several times and remains low. Patient has received 750cc bolus. Will give another 500cc bolus and consult hospitalist for admission.    I have personally reviewed the images performed during this visit and I agree with the Radiologist's read.   Interpretation by Radiologist:  DG Chest 1 View  Result Date: 06/24/2020 CLINICAL DATA:  Short of breath after dialysis EXAM: CHEST  1 VIEW COMPARISON:  06/08/2020 FINDINGS: Two frontal views of the chest demonstrate unremarkable cardiac silhouette. Radiopaque catheter is overlying the junction of the IVC and right atrium likely reflect an inferior dialysis catheter. There is mild central vascular congestion without airspace disease, effusion, or pneumothorax. Numerous vascular stents are again seen, stable. IMPRESSION: 1. Central vascular congestion without overt edema. 2. Superior extent of a dialysis catheter via IVC approach, tip overlying atriocaval junction. Electronically Signed   By: Randa Ngo M.D.   On: 06/24/2020 22:26      Rudene Re, MD 06/25/20 [place],  [place], MD 06/25/20 [phone]

## 2020-06-26 DIAGNOSIS — I5032 Chronic diastolic (congestive) heart failure: Secondary | ICD-10-CM | POA: Diagnosis not present

## 2020-06-26 DIAGNOSIS — N186 End stage renal disease: Secondary | ICD-10-CM | POA: Diagnosis not present

## 2020-06-26 DIAGNOSIS — U071 COVID-19: Secondary | ICD-10-CM | POA: Diagnosis not present

## 2020-06-26 DIAGNOSIS — Z992 Dependence on renal dialysis: Secondary | ICD-10-CM | POA: Diagnosis not present

## 2020-06-26 LAB — BASIC METABOLIC PANEL
Anion gap: 14 (ref 5–15)
BUN: 23 mg/dL (ref 8–23)
CO2: 23 mmol/L (ref 22–32)
Calcium: 9.1 mg/dL (ref 8.9–10.3)
Chloride: 103 mmol/L (ref 98–111)
Creatinine, Ser: 9.33 mg/dL — ABNORMAL HIGH (ref 0.61–1.24)
GFR, Estimated: 6 mL/min — ABNORMAL LOW (ref >60)
Glucose, Bld: 77 mg/dL (ref 70–99)
Potassium: 4.4 mmol/L (ref 3.5–5.1)
Sodium: 140 mmol/L (ref 135–145)

## 2020-06-26 LAB — CBC
HCT: 33.5 % — ABNORMAL LOW (ref 39.0–52.0)
Hemoglobin: 10.7 g/dL — ABNORMAL LOW (ref 13.0–17.0)
MCH: 32.2 pg (ref 26.0–34.0)
MCHC: 31.9 g/dL (ref 30.0–36.0)
MCV: 100.9 fL — ABNORMAL HIGH (ref 80.0–100.0)
Platelets: 182 10*3/uL (ref 150–400)
RBC: 3.32 MIL/uL — ABNORMAL LOW (ref 4.22–5.81)
RDW: 14.6 % (ref 11.5–15.5)
WBC: 6.8 10*3/uL (ref 4.0–10.5)
nRBC: 0 % (ref 0.0–0.2)

## 2020-06-26 MED ORDER — METOPROLOL TARTRATE 25 MG PO TABS
25.0000 mg | ORAL_TABLET | Freq: Every day | ORAL | Status: DC
  Administered 2020-06-27 – 2020-06-29 (×3): 25 mg via ORAL
  Filled 2020-06-26 (×3): qty 1

## 2020-06-26 NOTE — Evaluation (Signed)
Occupational Therapy Evaluation Patient Details Name: Johnny [name]. MRN: [id number] DOB: 01-30-1951 Today's Date: 06/26/2020    History of Present Illness Johnny [name] is a 61yoM who comes to [facility] on 2/16 c SOB, nausea after HD session. Pt had a new groin access placed c vasuclar surgery ~3W prior after toruble with his UE fistual. Pt having hypotension in ED. Pt is (+) COVID19. PMH: CHF, ESRD on HD MWF, HTN, CAD, anemia, GAD, DM, PVD s/p Rt BKA s/p AMB c prosthesis x6 months.   Clinical Impression   Pt seen for OT evaluation this date in setting of acute hospitalization with COVID. Pt reports performing all HH ADLs and fxl mobility with MOD I. Pt presents this date with only slightly decreased fxl activity tolerance and strength. Pt currently requires SETUP for seated UB ADLs and CGA for LB ADLs. In addition, pt requires CGA for ADL transfers which he usually completes with MOD I at baseline. Pt could benefit from continued skilled OT in acute setting to assist in maintaining pt strength and tolerance for safety with ADLs. Will continue to follow acutely, but do not anticipate he will require f/u upon d/c from acute setting. Pt left in chair with all needs met and in reach.     Follow Up Recommendations  No OT follow up    Equipment Recommendations  None recommended by OT (pt has all necessary equipment)    Recommendations for Other Services       Precautions / Restrictions Precautions Precautions: Fall Restrictions Weight Bearing Restrictions: No      Mobility Bed Mobility               General bed mobility comments: NT, pt up to chair pre/post OT    Transfers Overall transfer level: Needs assistance Equipment used: Rolling walker (2 wheeled);1 person hand held assist Transfers: Sit to/from [facility] Sit to Stand: Min guard Stand pivot transfers: Min guard            Balance Overall balance assessment: Modified Independent;Mild deficits  observed, not formally tested                                         ADL either performed or assessed with clinical judgement   ADL                                         General ADL Comments: INDEP to SETUP with seated UB ADLs, CGA with RW for sit to stand LB ADLs, SETUP for seated LB ADLs.     Vision Patient Visual Report: No change from baseline       Perception     Praxis      Pertinent Vitals/Pain Pain Assessment: No/denies pain     Hand Dominance Right   Extremity/Trunk Assessment Upper Extremity Assessment Upper Extremity Assessment: Overall WFL for tasks assessed;Generalized weakness (ROM WFL, MMT grossly 4-/5)   Lower Extremity Assessment Lower Extremity Assessment: Overall WFL for tasks assessed;Generalized weakness (h/o R BKA)   Cervical / Trunk Assessment Cervical / Trunk Assessment: Normal   Communication Communication Communication: No difficulties   Cognition Arousal/Alertness: Awake/alert Behavior During Therapy: WFL for tasks assessed/performed Overall Cognitive Status: Within Functional Limits for tasks assessed  General Comments       Exercises Other Exercises Other Exercises: OT educates re: role of OT, importance of OOB activity, benefits of OT particiapation. Pt with good understanding.   Shoulder Instructions      Home Living Family/patient expects to be discharged to:: Private residence Living Arrangements: Alone (neighbors and friends help regularly) Available Help at Discharge: Friend(s);Neighbor Type of Home: Apartment Home Access: Elevator     Home Layout: One level     Bathroom Shower/Tub: Chief Strategy Officer: Wheelchair - power;Walker - 2 wheels;Bedside commode;Shower seat - built in;Grab bars - tub/shower;Hand held shower head;Grab bars - toilet   Additional Comments: broken WC, prosthesis      Prior  Functioning/Environment Level of Independence: Needs assistance  Gait / Transfers Assistance Needed: drives self to grocery store, uses electronic cart ADL's / Homemaking Assistance Needed: MOD I with bathing/dressing.            OT Problem List: Decreased strength;Decreased activity tolerance      OT Treatment/Interventions: Self-care/ADL training;Therapeutic activities;Therapeutic exercise    OT Goals(Current goals can be found in the care plan section) Acute Rehab OT Goals Patient Stated Goal: maintain strength for DC back to home OT Goal Formulation: With patient Time For Goal Achievement: 07/10/20 Potential to Achieve Goals: Good ADL Goals Pt Will Transfer to Toilet: with modified independence;stand pivot transfer;bedside commode Pt/caregiver will Perform Home Exercise Program: Increased strength;Both right and left upper extremity;With Supervision  OT Frequency: Min 1X/week   Barriers to D/C:            Co-evaluation              AM-PAC OT "6 Clicks" Daily Activity     Outcome Measure Help from another person eating meals?: None Help from another person taking care of personal grooming?: None Help from another person toileting, which includes using toliet, bedpan, or urinal?: A Little Help from another person bathing (including washing, rinsing, drying)?: A Little Help from another person to put on and taking off regular upper body clothing?: None Help from another person to put on and taking off regular lower body clothing?: A Little 6 Click Score: 21   End of Session Equipment Utilized During Treatment: Gait belt  Activity Tolerance: Patient tolerated treatment well Patient left: in chair;with call bell/phone within reach  OT Visit Diagnosis: Unsteadiness on feet (R26.81);Muscle weakness (generalized) (M62.81)                Time: 1040-1106 OT Time Calculation (min): 26 min Charges:  OT General Charges $OT Visit: 1 Visit OT Evaluation $OT Eval Low  Complexity: 1 Low OT Treatments $Self Care/Home Management : 8-22 mins  Gerrianne Scale, MS, OTR/L ascom [phone] 06/26/20, 7:04 PM

## 2020-06-26 NOTE — Progress Notes (Addendum)
PROGRESS NOTE   HPI was taken from Dr. Damita Dunnings: Johnny [name]. is a 70 y.o. male with medical history significant for ESRD on HD MWF with recent placement on 06/09/2020 of left femoral vein tunneled dialysis catheter due to problems with AV fistula, as well as history of PAD status post right AKA, HTN, CAD, diastolic heart failure, who presents  for evaluation of shortness of breath. Patient was in his usual state of health  until he got home from dialysis on the day of arrival when he developed shortness of breath associated with a non productive cough. He denied chest pain, fever or chills. Denied vomiting, diarrhea or abdominal pain. ED Course: On arrival in the ED he was noted to be hypotensive at 84/56, with pulse of 91, afebrile, mild tachypnea of 20 with O2 sat 98% on room air. Blood work was for the most part unremarkable with WBC 9300, lactic acid 1.2, BNP 32, troponin 26>25. Procalcitonin was elevated at 3.38 but has been chronically elevated at 2-4 for the past few years. Covid positive, flu negative. EKG as reviewed by me : Sinus rhythm at 89 with no acute ST-T wave changes Imaging: Mild central vascular congestion without overt edema  Patient was given several gentle boluses of 250 mils to 500 mils for total of 1.25 L in the emergency room with only transient improvement in blood pressure. Was about to be discharged when recheck showed systolic in the [id number] at which point he was given an additional fluid bolus and hospitalist consulted for admission.   Hospital course from Dr. Jimmye Norman 2/17-2/18/22: Pt's dyspnea has resolved. Pt is still on IV remdesivir for [place]. Pt is not currently requiring supplemental oxygen. Of note, hypotension has resolved. PT has seen the and recs home health. HH orders were placed. Pt's MPOA, [name], says she unable to take care of pt while he is COVID19 positive as she has a new "grand baby" coming. [name] evidently does/helps with all of his ADLs. Unsure if  the pt will be a safe d/c with his MPOA not being able to help him.      Johnny [name].  WX:[id number] DOB: [birth year] DOA: 06/24/2020 PCP: [name], MD    Assessment & Plan:   Active Problems:   ESRD on hemodialysis (HCC)   Chronic diastolic CHF (congestive heart failure) ([place])   Above knee amputation of right lower extremity ([place])   COVID-19 virus infection   Hypotension of hemodialysis   Hypotension   Acute dyspnea: likely secondary to COVID19. CXR shows vascular congestion w/o overt edema. Not requiring supplemental oxygen. Resolved  COVID-19 virus infection: continue on IV remdesivir. Not currently hypoxic so will hold off of steroids at this time and continue to monitor closely.   Hypotension: etiology unclear. Keep MAP >65. Resolved. Will restart home dose of metoprolol. Will continue to hold losartan & hydralazine. Blood cxs NGTD  ESRD: on HD. W/ left femoral vein tunneled dialysis catheter, placed on 06/09/20. Management per nephro   ACD: likely secondary to ESRD. No transfusion needed currently   Chronic diastolic CHF: CXR shows vascular congestion but clinically appears euvolemic. Monitor I/Os. Daily weights  PAD: w/ hx of AKA of RLE. Continue on aspirin, statin  DVT prophylaxis: heparin  Code Status: full Family Communication: discussed pt's care w/ pt's MPOA, [name], and answered her questions. Please call [name] w/ updates Disposition Plan: PT recs home health. OT still needs to eval   Level of care: Progressive Cardiac   Status  is: Observation  The patient remains OBS appropriate and will d/c before 2 midnights. Unsafe d/c plan.  Unsure if the pt will be a safe d/c with his MPOA not being able to help him.    Dispo: The patient is from: Home              Anticipated d/c is to: likely home health               Anticipated d/c date is: 3 days              Patient currently is not medically stable to d/c.   Difficult to place patient  Yes    Consultants:     Procedures:    Antimicrobials:   Subjective: Pt c/o malaise  Objective: Vitals:   06/25/20 2349 06/26/20 0300 06/26/20 0400 06/26/20 0505  BP: 119/74 115/68    Pulse: 76 81    Resp: 14 15    Temp: 98.6 F (37 C) 98.5 F (36.9 C) 98.6 F (37 C)   TempSrc: Oral Oral Oral   SpO2: 97% 96%    Weight:    76.9 kg  Height:       No intake or output data in the 24 hours ending 06/26/20 0827 Filed Weights   06/24/20 2217 06/25/20 1436 06/26/20 0505  Weight: 73.5 kg 75.4 kg 76.9 kg    Examination:  General exam: Appears comfortable  Respiratory system: diminished breath sounds b/l otherwise clear  Cardiovascular system: S1/S2+. No rubs or clicks  Gastrointestinal system: Abd is soft, NT, ND & hypoactive bowel sounds  Central nervous system: Alert and oriented. Moves all extremities  Psychiatry: Judgement and insight appear normal. Flat mood and affect    Data Reviewed: I have personally reviewed following labs and imaging studies  CBC: Recent Labs  Lab 06/24/20 2229 06/26/20 0732  WBC 9.3 6.8  NEUTROABS 6.3  --   HGB 11.4* 10.7*  HCT 36.7* 33.5*  MCV 101.9* 100.9*  PLT 193 [id number]   Basic Metabolic Panel: Recent Labs  Lab 06/24/20 2320 06/26/20 0732  NA 138 140  K 3.9 4.4  CL 102 103  CO2 25 23  GLUCOSE 95 77  BUN 14 23  CREATININE 6.54* 9.33*  CALCIUM 8.8* 9.1   GFR: Estimated Creatinine Clearance: 7 mL/min (A) (by C-G formula based on SCr of 9.33 mg/dL (H)). Liver Function Tests: Recent Labs  Lab 06/24/20 2320  AST 13*  ALT 7  ALKPHOS 51  BILITOT 0.4  PROT 6.7  ALBUMIN 3.4*   No results for input(s): LIPASE, AMYLASE in the last 168 hours. No results for input(s): AMMONIA in the last 168 hours. Coagulation Profile: No results for input(s): INR, PROTIME in the last 168 hours. Cardiac Enzymes: No results for input(s): CKTOTAL, CKMB, CKMBINDEX, TROPONINI in the last 168 hours. BNP (last 3 results) No results for  input(s): PROBNP in the last 8760 hours. HbA1C: No results for input(s): HGBA1C in the last 72 hours. CBG: No results for input(s): GLUCAP in the last 168 hours. Lipid Profile: No results for input(s): CHOL, HDL, LDLCALC, TRIG, CHOLHDL, LDLDIRECT in the last 72 hours. Thyroid Function Tests: No results for input(s): TSH, T4TOTAL, FREET4, T3FREE, THYROIDAB in the last 72 hours. Anemia Panel: No results for input(s): VITAMINB12, FOLATE, FERRITIN, TIBC, IRON, RETICCTPCT in the last 72 hours. Sepsis Labs: Recent Labs  Lab 06/24/20 2229 06/24/20 2320  PROCALCITON  --  3.38  LATICACIDVEN 1.2  --  Recent Results (from the past 240 hour(s))  Resp Panel by RT-PCR (Flu A&B, Covid) Nasopharyngeal Swab     Status: Abnormal   Collection Time: 06/24/20 10:28 PM   Specimen: Nasopharyngeal Swab; Nasopharyngeal(NP) swabs in vial transport medium  Result Value Ref Range Status   SARS Coronavirus 2 by RT PCR POSITIVE (A) NEGATIVE Final    Comment: RESULT CALLED TO, READ BACK BY AND VERIFIED WITH: KANDELL SHEETS '@0001'$  ON 06/25/20 SKL (NOTE) SARS-CoV-2 target nucleic acids are DETECTED.  The SARS-CoV-2 RNA is generally detectable in upper respiratory specimens during the acute phase of infection. Positive results are indicative of the presence of the identified virus, but do not rule out bacterial infection or co-infection with other pathogens not detected by the test. Clinical correlation with patient history and other diagnostic information is necessary to determine patient infection status. The expected result is Negative.  Fact Sheet for Patients: EntrepreneurPulse.com.au  Fact Sheet for Healthcare Providers: IncredibleEmployment.be  This test is not yet approved or cleared by the [place] FDA and  has been authorized for detection and/or diagnosis of SARS-CoV-2 by FDA under an Emergency Use Authorization (EUA).  This EUA will remain in effect  (meaning this test can  be used) for the duration of  the COVID-19 declaration under Section 564(b)(1) of the Act, 21 U.S.C. section 360bbb-3(b)(1), unless the authorization is terminated or revoked sooner.     Influenza A by PCR NEGATIVE NEGATIVE Final   Influenza B by PCR NEGATIVE NEGATIVE Final    Comment: (NOTE) The Xpert Xpress SARS-CoV-2/FLU/RSV plus assay is intended as an aid in the diagnosis of influenza from Nasopharyngeal swab specimens and should not be used as a sole basis for treatment. Nasal washings and aspirates are unacceptable for Xpert Xpress SARS-CoV-2/FLU/RSV testing.  Fact Sheet for Patients: EntrepreneurPulse.com.au  Fact Sheet for Healthcare Providers: IncredibleEmployment.be  This test is not yet approved or cleared by the [place] FDA and has been authorized for detection and/or diagnosis of SARS-CoV-2 by FDA under an Emergency Use Authorization (EUA). This EUA will remain in effect (meaning this test can be used) for the duration of the COVID-19 declaration under Section 564(b)(1) of the Act, 21 U.S.C. section 360bbb-3(b)(1), unless the authorization is terminated or revoked.  Performed at [facility], [address] [place], [place] [address]   CULTURE, BLOOD (ROUTINE X 2) w Reflex to ID Panel     Status: None (Preliminary result)   Collection Time: 06/25/20  5:03 AM   Specimen: BLOOD  Result Value Ref Range Status   Specimen Description BLOOD RIGHT FA  Final   Special Requests   Final    BOTTLES DRAWN AEROBIC AND ANAEROBIC Blood Culture adequate volume   Culture   Final    NO GROWTH 1 DAY Performed at [facility], [address] [place], [place] [address]    Report Status PENDING  Incomplete  CULTURE, BLOOD (ROUTINE X 2) w Reflex to ID Panel     Status: None (Preliminary result)   Collection Time: 06/25/20  5:03 AM   Specimen: BLOOD  Result Value Ref Range Status   Specimen  Description BLOOD LEFT AC  Final   Special Requests   Final    BOTTLES DRAWN AEROBIC AND ANAEROBIC Blood Culture results may not be optimal due to an excessive volume of blood received in culture bottles   Culture   Final    NO GROWTH 1 DAY Performed at [facility], [address],  [place], [place] [address]    Report Status PENDING  Incomplete         Radiology Studies: DG Chest 1 View  Result Date: 06/24/2020 CLINICAL DATA:  Short of breath after dialysis EXAM: CHEST  1 VIEW COMPARISON:  06/08/2020 FINDINGS: Two frontal views of the chest demonstrate unremarkable cardiac silhouette. Radiopaque catheter is overlying the junction of the IVC and right atrium likely reflect an inferior dialysis catheter. There is mild central vascular congestion without airspace disease, effusion, or pneumothorax. Numerous vascular stents are again seen, stable. IMPRESSION: 1. Central vascular congestion without overt edema. 2. Superior extent of a dialysis catheter via IVC approach, tip overlying atriocaval junction. Electronically Signed   By: Randa Ngo M.D.   On: 06/24/2020 22:26        Scheduled Meds: . albuterol  2 puff Inhalation Q6H  . vitamin C  500 mg Oral Daily  . Chlorhexidine Gluconate Cloth  6 each Topical Q0600  . heparin  5,000 Units Subcutaneous Q8H  . sodium chloride flush  3 mL Intravenous Q12H  . zinc sulfate  220 mg Oral Daily   Continuous Infusions: . remdesivir 100 mg in NS 100 mL       LOS: 0 days    Time spent: 35 mins    Wyvonnia Dusky, MD [facility] Pager [phone]  If 7PM-7AM, please contact night-coverage 06/26/2020, 8:27 AM

## 2020-06-26 NOTE — Progress Notes (Signed)
[facility]  ROUNDING NOTE   Subjective:   Mr. Antonino Schoettle. was admitted to [facility] on 06/24/2020 for SOB (shortness of breath) [R06.02] ESRD (end stage renal disease) ([place]) [N18.6] Hypotension [I95.9] Hypotension, unspecified hypotension type [I95.9] COVID-19 [U07.1]  Last hemodialysis treatment was yesterday, 2/16  Patient was diagnosed with COVID-19+  Patient seen resting quietly in bed No complains of shortness of breath and chest pain Alert and oriented Able to eat meals without nausea No concerns at this time   Objective:  Vital signs in last 24 hours:  Temp:  [98 F (36.7 C)-98.6 F (37 C)] 98 F (36.7 C) (02/18 0800) Pulse Rate:  [76-91] 88 (02/18 0800) Resp:  [11-18] 11 (02/18 0800) BP: (90-136)/(44-84) 133/84 (02/18 0800) SpO2:  [96 %-100 %] 99 % (02/18 0800) Weight:  [75.4 kg-76.9 kg] 76.9 kg (02/18 0505)  Weight change: 1.95 kg Filed Weights   06/24/20 2217 06/25/20 1436 06/26/20 0505  Weight: 73.5 kg 75.4 kg 76.9 kg    Intake/Output: No intake/output data recorded.   Intake/Output this shift:  No intake/output data recorded.  Physical Exam: General: NAD,   Head: Normocephalic, atraumatic. Moist oral mucosal membranes  Eyes: Anicteric, PERRL  Neck: Supple, trachea midline  Lungs:  Clear to auscultation  Heart: Regular rate and rhythm  Abdomen:  Soft, nontender,   Extremities: no peripheral edema.  Neurologic: Nonfocal, moving all four extremities  Skin: No lesions  Access: Left femoral permcath    Basic Metabolic Panel: Recent Labs  Lab 06/24/20 2320 06/26/20 0732  NA 138 140  K 3.9 4.4  CL 102 103  CO2 25 23  GLUCOSE 95 77  BUN 14 23  CREATININE 6.54* 9.33*  CALCIUM 8.8* 9.1    Liver Function Tests: Recent Labs  Lab 06/24/20 2320  AST 13*  ALT 7  ALKPHOS 51  BILITOT 0.4  PROT 6.7  ALBUMIN 3.4*   No results for input(s): LIPASE, AMYLASE in the last 168 hours. No results for input(s): AMMONIA in the last  168 hours.  CBC: Recent Labs  Lab 06/24/20 2229 06/26/20 0732  WBC 9.3 6.8  NEUTROABS 6.3  --   HGB 11.4* 10.7*  HCT 36.7* 33.5*  MCV 101.9* 100.9*  PLT 193 182    Cardiac Enzymes: No results for input(s): CKTOTAL, CKMB, CKMBINDEX, TROPONINI in the last 168 hours.  BNP: Invalid input(s): POCBNP  CBG: No results for input(s): GLUCAP in the last 168 hours.  Microbiology: Results for orders placed or performed during the hospital encounter of 06/24/20  Resp Panel by RT-PCR (Flu A&B, Covid) Nasopharyngeal Swab     Status: Abnormal   Collection Time: 06/24/20 10:28 PM   Specimen: Nasopharyngeal Swab; Nasopharyngeal(NP) swabs in vial transport medium  Result Value Ref Range Status   SARS Coronavirus 2 by RT PCR POSITIVE (A) NEGATIVE Final    Comment: RESULT CALLED TO, READ BACK BY AND VERIFIED WITH: KANDELL SHEETS '@0001'$  ON 06/25/20 SKL (NOTE) SARS-CoV-2 target nucleic acids are DETECTED.  The SARS-CoV-2 RNA is generally detectable in upper respiratory specimens during the acute phase of infection. Positive results are indicative of the presence of the identified virus, but do not rule out bacterial infection or co-infection with other pathogens not detected by the test. Clinical correlation with patient history and other diagnostic information is necessary to determine patient infection status. The expected result is Negative.  Fact Sheet for Patients: EntrepreneurPulse.com.au  Fact Sheet for Healthcare Providers: IncredibleEmployment.be  This test is not yet approved  or cleared by the [place] and  has been authorized for detection and/or diagnosis of SARS-CoV-2 by FDA under an Emergency Use Authorization (EUA).  This EUA will remain in effect (meaning this test can  be used) for the duration of  the COVID-19 declaration under Section 564(b)(1) of the Act, 21 U.S.C. section 360bbb-3(b)(1), unless the authorization  is terminated or revoked sooner.     Influenza A by PCR NEGATIVE NEGATIVE Final   Influenza B by PCR NEGATIVE NEGATIVE Final    Comment: (NOTE) The Xpert Xpress SARS-CoV-2/FLU/RSV plus assay is intended as an aid in the diagnosis of influenza from Nasopharyngeal swab specimens and should not be used as a sole basis for treatment. Nasal washings and aspirates are unacceptable for Xpert Xpress SARS-CoV-2/FLU/RSV testing.  Fact Sheet for Patients: EntrepreneurPulse.com.au  Fact Sheet for Healthcare Providers: IncredibleEmployment.be  This test is not yet approved or cleared by the [place] FDA and has been authorized for detection and/or diagnosis of SARS-CoV-2 by FDA under an Emergency Use Authorization (EUA). This EUA will remain in effect (meaning this test can be used) for the duration of the COVID-19 declaration under Section 564(b)(1) of the Act, 21 U.S.C. section 360bbb-3(b)(1), unless the authorization is terminated or revoked.  Performed at [facility], [address] [place], [place] [address]   CULTURE, BLOOD (ROUTINE X 2) w Reflex to ID Panel     Status: None (Preliminary result)   Collection Time: 06/25/20  5:03 AM   Specimen: BLOOD  Result Value Ref Range Status   Specimen Description BLOOD RIGHT FA  Final   Special Requests   Final    BOTTLES DRAWN AEROBIC AND ANAEROBIC Blood Culture adequate volume   Culture   Final    NO GROWTH 1 DAY Performed at [facility], [address], [place], [place] [address]    Report Status PENDING  Incomplete  CULTURE, BLOOD (ROUTINE X 2) w Reflex to ID Panel     Status: None (Preliminary result)   Collection Time: 06/25/20  5:03 AM   Specimen: BLOOD  Result Value Ref Range Status   Specimen Description BLOOD LEFT AC  Final   Special Requests   Final    BOTTLES DRAWN AEROBIC AND ANAEROBIC Blood Culture results may not be optimal due to an excessive volume of blood  received in culture bottles   Culture   Final    NO GROWTH 1 DAY Performed at [facility], [address], [place], [place] [address]    Report Status PENDING  Incomplete    Coagulation Studies: No results for input(s): LABPROT, INR in the last 72 hours.  Urinalysis: No results for input(s): COLORURINE, LABSPEC, PHURINE, GLUCOSEU, HGBUR, BILIRUBINUR, KETONESUR, PROTEINUR, UROBILINOGEN, NITRITE, LEUKOCYTESUR in the last 72 hours.  Invalid input(s): APPERANCEUR    Imaging: DG Chest 1 View  Result Date: 06/24/2020 CLINICAL DATA:  Short of breath after dialysis EXAM: CHEST  1 VIEW COMPARISON:  06/08/2020 FINDINGS: Two frontal views of the chest demonstrate unremarkable cardiac silhouette. Radiopaque catheter is overlying the junction of the IVC and right atrium likely reflect an inferior dialysis catheter. There is mild central vascular congestion without airspace disease, effusion, or pneumothorax. Numerous vascular stents are again seen, stable. IMPRESSION: 1. Central vascular congestion without overt edema. 2. Superior extent of a dialysis catheter via IVC approach, tip overlying atriocaval junction. Electronically Signed   By: Randa Ngo M.D.   On: 06/24/2020 22:26     Medications:   .  remdesivir 100 mg in NS 100 mL 100 mg (06/26/20 1004)   . albuterol  2 puff Inhalation Q6H  . vitamin C  500 mg Oral Daily  . Chlorhexidine Gluconate Cloth  6 each Topical Q0600  . heparin  5,000 Units Subcutaneous Q8H  . sodium chloride flush  3 mL Intravenous Q12H  . zinc sulfate  220 mg Oral Daily   acetaminophen, chlorpheniramine-HYDROcodone, guaiFENesin-dextromethorphan, ondansetron **OR** ondansetron (ZOFRAN) IV  Assessment/ Plan:  Mr. Cy Barrish. is a 70 y.o. black male with end stage renal disease on hemodialysis, peripheral vascular disease status post right AKA, coronary artery disease, hypertension, hyperlipidemia, congestive heart failure who is admitted to [facility]  on 06/24/2020 for SOB (shortness of breath) [R06.02] ESRD (end stage renal disease) ([place]) [N18.6] Hypotension [I95.9] Hypotension, unspecified hypotension type [I95.9] COVID-19 [U07.1] Found to have COVID-19+ with cough and shortness of breath. Patient is vaccinated.   [facility] MWF [facility] left femoral permcath 79kg  1. End Stage renal disease: MWF.  -patient informed of hospital dialysis covid schedule -he is agreeable to TTS dialysis schedule in hospital -Will plan dialysis for tomorrow  2. Hypotension: holding home regimen of hydralazine, metoprolol, losartan.  BP controlled  3. Anemia of chronic kidney disease: hemoglobin 11.4. macrocytic.  - EPO as outpatient.  -Will monitor this level  4. Secondary Hyperparathyroidism:  - sevelamer with meals TID   LOS: 0 Shantelle Breeze 2/18/202212:30 PM

## 2020-06-26 NOTE — TOC Progression Note (Signed)
Transition of Care (TOC) - Progression Note    Patient Details  Name: Johnny [name]. MRN: [id number] Date of Birth: 08/13/1950  Transition of [facility]) CM/SW Contact  Kerin Salen, RN Phone Number: 06/26/2020, 3:40 PM  Clinical Narrative: Receive a call from [facility] from Dialysis unit that there is a concern that patient will not have a safe discharge, because family will not take him due to his COVID positive Dx. Attending notified to call family to discuss.           Expected Discharge Plan and Services                                                 Social Determinants of Health (SDOH) Interventions    Readmission Risk Interventions No flowsheet data found.

## 2020-06-26 NOTE — Evaluation (Signed)
Physical Therapy Evaluation Patient Details Name: Johnny [name]. MRN: [id number] DOB: [birth year] Today's Date: 06/26/2020   History of Present Illness  Johnny [name] is a 55yoM who comes to [facility] on 2/16 c SOB, nausea after HD session. Pt had a new groin access placed c vasuclar surgery ~3W prior after toruble with his UE fistual. Pt having hypotension in ED. Pt is (+) COVID19. PMH: CHF, ESRD on HD MWF, HTN, CAD, anemia, GAD, DM, PVD s/p Rt BKA s/p AMB c prosthesis x6 months.  Clinical Impression  Pt admitted with above diagnosis. Pt currently with functional limitations due to the deficits listed below (see "PT Problem List"). Upon entry, pt in bed, awake and agreeable to participate. Pt familiar to author from outpatient services. The pt is alert, pleasant, interactive, and able to provide info regarding prior level of function, both in tolerance and independence. No WC or AKA prosthesis are on site, hence full mobility assessment not available at this time, however adequate mobility performed to assess preparedness for return to home. Supervision level bed mobility, STS transfers and stand-pivot transfers with RW. Pt denies any abnormal symptomology associated with these activity aside from generalized weakness. At DC pt can continue with [facility] therapy services with eventual transition back to OPPT when he feels he can safely do this.   Patient's performance this date reveals decreased ability, independence, and tolerance in performing all basic mobility required for performance of activities of daily living. Pt requires additional DME, close physical assistance, and cues for safe participate in mobility. Pt will benefit from skilled PT intervention to increase independence and safety with basic mobility in preparation for discharge to the venue listed below.       Follow Up Recommendations Home health PT    Equipment Recommendations  None recommended by PT    Recommendations for Other  Services       Precautions / Restrictions Precautions Precautions: Fall Restrictions Weight Bearing Restrictions: No      Mobility  Bed Mobility Overal bed mobility: Modified Independent                  Transfers Overall transfer level: Needs assistance Equipment used: Rolling walker (2 wheeled);1 person hand held assist Transfers: Sit to/from Stand;Stand Pivot Transfers Sit to Stand: Min guard Stand pivot transfers: Min guard       General transfer comment: moving very well  Ambulation/Gait Ambulation/Gait assistance:  (deferred; prosthesis not on site)              Stairs            Wheelchair Mobility    Modified Rankin (Stroke Patients Only)       Balance Overall balance assessment: Modified Independent;Mild deficits observed, not formally tested                                           Pertinent Vitals/Pain Pain Assessment: No/denies pain    Home Living Family/patient expects to be discharged to:: Private residence Living Arrangements: Alone (neighbors, friends help regularly.) Available Help at Discharge: Friend(s);Neighbor Type of Home: Apartment Home Access: Elevator     Home Layout: One level Home Equipment: Wheelchair - power;Walker - 2 wheels;Bedside commode;Shower seat - built in;Grab bars - tub/shower;Hand held shower head;Grab bars - toilet Additional Comments: broken WC, prosthesis    Prior Function Level of Independence: Needs assistance  Gait / Transfers Assistance Needed: drives self to grocery store, uses electronic cart  ADL's / Homemaking Assistance Needed: mnodI bathing dressing        Hand Dominance        Extremity/Trunk Assessment   Upper Extremity Assessment Upper Extremity Assessment: Generalized weakness;Overall [facility] for tasks assessed    Lower Extremity Assessment Lower Extremity Assessment: Generalized weakness;Overall [facility] for tasks assessed    Cervical / Trunk  Assessment Cervical / Trunk Assessment: Normal  Communication      Cognition Arousal/Alertness: Awake/alert Behavior During Therapy: WFL for tasks assessed/performed Overall Cognitive Status: Within Functional Limits for tasks assessed                                        General Comments      Exercises Other Exercises Other Exercises: STS from EOB 1x5 x RW Other Exercises: Static standing 1x60sec c HHA Other Exercises: Static standing 1x60sec c RW   Assessment/Plan    PT Assessment Patient needs continued PT services  PT Problem List Decreased strength;Decreased activity tolerance;Decreased mobility;Decreased balance       PT Treatment Interventions Balance training;Stair training;Functional mobility training;Therapeutic activities;Therapeutic exercise;Patient/family education    PT Goals (Current goals can be found in the Care Plan section)  Acute Rehab PT Goals Patient Stated Goal: maintain strength for DC back to home PT Goal Formulation: With patient Time For Goal Achievement: 07/10/20 Potential to Achieve Goals: Good    Frequency Min 2X/week   Barriers to discharge Decreased caregiver support      Co-evaluation               AM-PAC PT "6 Clicks" Mobility  Outcome Measure Help needed turning from your back to your side while in a flat bed without using bedrails?: None Help needed moving from lying on your back to sitting on the side of a flat bed without using bedrails?: None Help needed moving to and from a bed to a chair (including a wheelchair)?: A Little Help needed standing up from a chair using your arms (e.g., wheelchair or bedside chair)?: A Little Help needed to walk in hospital room?: A Lot Help needed climbing 3-5 steps with a railing? : Total 6 Click Score: 17    End of Session   Activity Tolerance: Patient tolerated treatment well;No increased pain Patient left: in chair;with nursing/sitter in room;with call bell/phone  within reach Nurse Communication: Mobility status PT Visit Diagnosis: Difficulty in walking, not elsewhere classified (R26.2);Unsteadiness on feet (R26.81);Other abnormalities of gait and mobility (R26.89);Muscle weakness (generalized) (M62.81)    Time: [id number] PT Time Calculation (min) (ACUTE ONLY): 34 min   Charges:   PT Evaluation $PT Eval Low Complexity: 1 Low PT Treatments $Therapeutic Exercise: 8-22 mins        11:19 AM, 06/26/20 Etta Grandchild, PT, DPT Physical Therapist - [facility]  [phone] ([place])    Keziyah Kneale C 06/26/2020, 11:15 AM

## 2020-06-26 NOTE — Plan of Care (Signed)
  Problem: Respiratory: Goal: Will maintain a patent airway 06/26/2020 0229 by Tawni Millers, RN Outcome: Progressing 06/26/2020 0228 by Tawni Millers, RN Outcome: Progressing  Remains on room air without distress.  Saturations>90%.

## 2020-06-27 DIAGNOSIS — U071 COVID-19: Secondary | ICD-10-CM | POA: Diagnosis not present

## 2020-06-27 DIAGNOSIS — I9589 Other hypotension: Secondary | ICD-10-CM | POA: Diagnosis not present

## 2020-06-27 LAB — CBC
HCT: 32.7 % — ABNORMAL LOW (ref 39.0–52.0)
HCT: 37.8 % — ABNORMAL LOW (ref 39.0–52.0)
Hemoglobin: 10.2 g/dL — ABNORMAL LOW (ref 13.0–17.0)
Hemoglobin: 11.7 g/dL — ABNORMAL LOW (ref 13.0–17.0)
MCH: 31.2 pg (ref 26.0–34.0)
MCH: 31.7 pg (ref 26.0–34.0)
MCHC: 31.2 g/dL (ref 30.0–36.0)
MCHC: 31 g/dL (ref 30.0–36.0)
MCV: 100 fL (ref 80.0–100.0)
MCV: 102.4 fL — ABNORMAL HIGH (ref 80.0–100.0)
Platelets: 174 10*3/uL (ref 150–400)
Platelets: 192 10*3/uL (ref 150–400)
RBC: 3.27 MIL/uL — ABNORMAL LOW (ref 4.22–5.81)
RBC: 3.69 MIL/uL — ABNORMAL LOW (ref 4.22–5.81)
RDW: 14.2 % (ref 11.5–15.5)
RDW: 14.5 % (ref 11.5–15.5)
WBC: 7.3 10*3/uL (ref 4.0–10.5)
WBC: 9.8 10*3/uL (ref 4.0–10.5)
nRBC: 0 % (ref 0.0–0.2)
nRBC: 0 % (ref 0.0–0.2)

## 2020-06-27 LAB — BASIC METABOLIC PANEL
Anion gap: 15 (ref 5–15)
BUN: 31 mg/dL — ABNORMAL HIGH (ref 8–23)
CO2: 23 mmol/L (ref 22–32)
Calcium: 9 mg/dL (ref 8.9–10.3)
Chloride: 102 mmol/L (ref 98–111)
Creatinine, Ser: 10.64 mg/dL — ABNORMAL HIGH (ref 0.61–1.24)
GFR, Estimated: 5 mL/min — ABNORMAL LOW (ref >60)
Glucose, Bld: 81 mg/dL (ref 70–99)
Potassium: 4.3 mmol/L (ref 3.5–5.1)
Sodium: 140 mmol/L (ref 135–145)

## 2020-06-27 MED ORDER — PENTAFLUOROPROP-TETRAFLUOROETH EX AERO
1.0000 "application " | INHALATION_SPRAY | CUTANEOUS | Status: DC | PRN
  Filled 2020-06-27: qty 30

## 2020-06-27 MED ORDER — ALTEPLASE 2 MG IJ SOLR: 2.0000 mg | Freq: Once | INTRAMUSCULAR | Status: DC | PRN

## 2020-06-27 MED ORDER — CHLORHEXIDINE GLUCONATE CLOTH 2 % EX PADS
6.0000 | MEDICATED_PAD | Freq: Every day | CUTANEOUS | Status: DC
  Administered 2020-06-27 – 2020-06-30 (×4): 6 via TOPICAL

## 2020-06-27 MED ORDER — HEPARIN SODIUM (PORCINE) 1000 UNIT/ML DIALYSIS: 1000.0000 [IU] | INTRAMUSCULAR | Status: DC | PRN

## 2020-06-27 MED ORDER — SODIUM CHLORIDE 0.9 % IV SOLN: 100.0000 mL | INTRAVENOUS | Status: DC | PRN

## 2020-06-27 MED ORDER — SEVELAMER CARBONATE 800 MG PO TABS
2400.0000 mg | ORAL_TABLET | Freq: Three times a day (TID) | ORAL | Status: DC
  Administered 2020-06-28 – 2020-06-30 (×7): 2400 mg via ORAL
  Filled 2020-06-27 (×7): qty 3

## 2020-06-27 MED ORDER — GABAPENTIN 100 MG PO CAPS
200.0000 mg | ORAL_CAPSULE | Freq: Two times a day (BID) | ORAL | Status: DC
  Administered 2020-06-27 – 2020-06-29 (×5): 200 mg via ORAL
  Filled 2020-06-27 (×5): qty 2

## 2020-06-27 MED ORDER — LIDOCAINE-PRILOCAINE 2.5-2.5 % EX CREA
1.0000 "application " | TOPICAL_CREAM | CUTANEOUS | Status: DC | PRN
  Filled 2020-06-27: qty 5

## 2020-06-27 MED ORDER — CLOPIDOGREL BISULFATE 75 MG PO TABS
75.0000 mg | ORAL_TABLET | Freq: Every day | ORAL | Status: DC
  Administered 2020-06-28 – 2020-06-29 (×2): 75 mg via ORAL
  Filled 2020-06-27 (×2): qty 1

## 2020-06-27 MED ORDER — CLONAZEPAM 0.5 MG PO TABS
0.5000 mg | ORAL_TABLET | Freq: Two times a day (BID) | ORAL | Status: DC | PRN
  Administered 2020-06-30: 0.5 mg via ORAL
  Filled 2020-06-27: qty 1

## 2020-06-27 MED ORDER — ATORVASTATIN CALCIUM 10 MG PO TABS
10.0000 mg | ORAL_TABLET | Freq: Every day | ORAL | Status: DC
  Administered 2020-06-28 – 2020-06-29 (×2): 10 mg via ORAL
  Filled 2020-06-27 (×2): qty 1

## 2020-06-27 MED ORDER — ALLOPURINOL 100 MG PO TABS
100.0000 mg | ORAL_TABLET | Freq: Every day | ORAL | Status: DC
  Administered 2020-06-28 – 2020-06-29 (×2): 100 mg via ORAL
  Filled 2020-06-27 (×4): qty 1

## 2020-06-27 MED ORDER — LOSARTAN POTASSIUM 50 MG PO TABS
100.0000 mg | ORAL_TABLET | Freq: Every day | ORAL | Status: DC
  Administered 2020-06-28: 100 mg via ORAL
  Filled 2020-06-27: qty 2

## 2020-06-27 MED ORDER — ASPIRIN EC 81 MG PO TBEC
81.0000 mg | DELAYED_RELEASE_TABLET | Freq: Every day | ORAL | Status: DC
  Administered 2020-06-28 – 2020-06-29 (×2): 81 mg via ORAL
  Filled 2020-06-27 (×2): qty 1

## 2020-06-27 MED ORDER — LIDOCAINE HCL (PF) 1 % IJ SOLN
5.0000 mL | INTRAMUSCULAR | Status: DC | PRN
  Filled 2020-06-27: qty 5

## 2020-06-27 NOTE — Progress Notes (Addendum)
[facility]  ROUNDING NOTE   Subjective:   [name] was admitted to [facility] on 06/24/2020 for SOB (shortness of breath) [R06.02] ESRD (end stage renal disease) ([place]) [N18.6] Hypotension [I95.9] Hypotension, unspecified hypotension type [I95.9] COVID-19 [U07.1]  Patient was diagnosed with COVID-19+ (06/24/2020)  Patient resting in bed Alert and oriented Able to eat breakfast without concerns Denies shortness of breath and chest pain    Objective:  Vital signs in last 24 hours:  Temp:  [98.1 F (36.7 C)-98.8 F (37.1 C)] 98.1 F (36.7 C) (02/19 1105) Pulse Rate:  [89-94] 91 (02/19 1105) Resp:  [16-18] 16 (02/19 1105) BP: (130-172)/(56-84) 172/84 (02/19 1105) SpO2:  [96 %-99 %] 99 % (02/19 1105) Weight:  [78.2 kg] 78.2 kg (02/19 0421)  Weight change: 2.812 kg Filed Weights   06/25/20 1436 06/26/20 0505 06/27/20 0421  Weight: 75.4 kg 76.9 kg 78.2 kg    Intake/Output: No intake/output data recorded.   Intake/Output this shift:  No intake/output data recorded.  Physical Exam: General: NAD,   Head: Normocephalic, atraumatic. Moist oral mucosal membranes  Eyes: Anicteric,  Lungs:  Clear to auscultation  Heart: Regular rate and rhythm  Abdomen:  Soft, nontender,   Extremities: no peripheral edema.rt bKA  Neurologic: Nonfocal,alert, oriented  Access: Left femoral permcath    Basic Metabolic Panel: Recent Labs  Lab 06/24/20 2320 06/26/20 0732 06/27/20 0544  NA 138 140 140  K 3.9 4.4 4.3  CL 102 103 102  CO2 '25 23 23  '$ GLUCOSE 95 77 81  BUN 14 23 31*  CREATININE 6.54* 9.33* 10.64*  CALCIUM 8.8* 9.1 9.0    Liver Function Tests: Recent Labs  Lab 06/24/20 2320  AST 13*  ALT 7  ALKPHOS 51  BILITOT 0.4  PROT 6.7  ALBUMIN 3.4*   No results for input(s): LIPASE, AMYLASE in the last 168 hours. No results for input(s): AMMONIA in the last 168 hours.  CBC: Recent Labs  Lab 06/24/20 2229 06/26/20 0732 06/27/20 0544  WBC 9.3  6.8 7.3  NEUTROABS 6.3  --   --   HGB 11.4* 10.7* 10.2*  HCT 36.7* 33.5* 32.7*  MCV 101.9* 100.9* 100.0  PLT 193 182 174    Cardiac Enzymes: No results for input(s): CKTOTAL, CKMB, CKMBINDEX, TROPONINI in the last 168 hours.  BNP: Invalid input(s): POCBNP  CBG: No results for input(s): GLUCAP in the last 168 hours.  Microbiology: Results for orders placed or performed during the hospital encounter of 06/24/20  Resp Panel by RT-PCR (Flu A&B, Covid) Nasopharyngeal Swab     Status: Abnormal   Collection Time: 06/24/20 10:28 PM   Specimen: Nasopharyngeal Swab; Nasopharyngeal(NP) swabs in vial transport medium  Result Value Ref Range Status   SARS Coronavirus 2 by RT PCR POSITIVE (A) NEGATIVE Final    Comment: RESULT CALLED TO, READ BACK BY AND VERIFIED WITH: KANDELL SHEETS '@0001'$  ON 06/25/20 SKL (NOTE) SARS-CoV-2 target nucleic acids are DETECTED.  The SARS-CoV-2 RNA is generally detectable in upper respiratory specimens during the acute phase of infection. Positive results are indicative of the presence of the identified virus, but do not rule out bacterial infection or co-infection with other pathogens not detected by the test. Clinical correlation with patient history and other diagnostic information is necessary to determine patient infection status. The expected result is Negative.  Fact Sheet for Patients: EntrepreneurPulse.com.au  Fact Sheet for Healthcare Providers: IncredibleEmployment.be  This test is not yet approved or cleared by the [place] FDA  and  has been authorized for detection and/or diagnosis of SARS-CoV-2 by FDA under an Emergency Use Authorization (EUA).  This EUA will remain in effect (meaning this test can  be used) for the duration of  the COVID-19 declaration under Section 564(b)(1) of the Act, 21 U.S.C. section 360bbb-3(b)(1), unless the authorization is terminated or revoked sooner.     Influenza A by  PCR NEGATIVE NEGATIVE Final   Influenza B by PCR NEGATIVE NEGATIVE Final    Comment: (NOTE) The Xpert Xpress SARS-CoV-2/FLU/RSV plus assay is intended as an aid in the diagnosis of influenza from Nasopharyngeal swab specimens and should not be used as a sole basis for treatment. Nasal washings and aspirates are unacceptable for Xpert Xpress SARS-CoV-2/FLU/RSV testing.  Fact Sheet for Patients: EntrepreneurPulse.com.au  Fact Sheet for Healthcare Providers: IncredibleEmployment.be  This test is not yet approved or cleared by the [place] FDA and has been authorized for detection and/or diagnosis of SARS-CoV-2 by FDA under an Emergency Use Authorization (EUA). This EUA will remain in effect (meaning this test can be used) for the duration of the COVID-19 declaration under Section 564(b)(1) of the Act, 21 U.S.C. section 360bbb-3(b)(1), unless the authorization is terminated or revoked.  Performed at [facility], [address] [place], [place] [address]   CULTURE, BLOOD (ROUTINE X 2) w Reflex to ID Panel     Status: None (Preliminary result)   Collection Time: 06/25/20  5:03 AM   Specimen: BLOOD  Result Value Ref Range Status   Specimen Description BLOOD RIGHT FA  Final   Special Requests   Final    BOTTLES DRAWN AEROBIC AND ANAEROBIC Blood Culture adequate volume   Culture   Final    NO GROWTH 2 DAYS Performed at [facility], [address], [place], [place] [address]    Report Status PENDING  Incomplete  CULTURE, BLOOD (ROUTINE X 2) w Reflex to ID Panel     Status: None (Preliminary result)   Collection Time: 06/25/20  5:03 AM   Specimen: BLOOD  Result Value Ref Range Status   Specimen Description BLOOD LEFT AC  Final   Special Requests   Final    BOTTLES DRAWN AEROBIC AND ANAEROBIC Blood Culture results may not be optimal due to an excessive volume of blood received in culture bottles   Culture   Final    NO  GROWTH 2 DAYS Performed at [facility], [address] [place], [place] [address]    Report Status PENDING  Incomplete    Coagulation Studies: No results for input(s): LABPROT, INR in the last 72 hours.  Urinalysis: No results for input(s): COLORURINE, LABSPEC, PHURINE, GLUCOSEU, HGBUR, BILIRUBINUR, KETONESUR, PROTEINUR, UROBILINOGEN, NITRITE, LEUKOCYTESUR in the last 72 hours.  Invalid input(s): APPERANCEUR    Imaging: No results found.   Medications:   . remdesivir 100 mg in NS 100 mL 100 mg (06/27/20 0843)   . albuterol  2 puff Inhalation Q6H  . vitamin C  500 mg Oral Daily  . Chlorhexidine Gluconate Cloth  6 each Topical Q0600  . Chlorhexidine Gluconate Cloth  6 each Topical Q0600  . heparin  5,000 Units Subcutaneous Q8H  . metoprolol tartrate  25 mg Oral Daily  . sodium chloride flush  3 mL Intravenous Q12H  . zinc sulfate  220 mg Oral Daily   acetaminophen, chlorpheniramine-HYDROcodone, guaiFENesin-dextromethorphan, ondansetron **OR** ondansetron (ZOFRAN) IV  Assessment/ Plan:  [name] is a 70 y.o. 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 Patient is vaccinated.   [facility] MWF [place] left femoral permcath 79kg  1. End Stage renal disease: MWF.  -patient informed of hospital dialysis covid schedule -he is agreeable to TTS dialysis schedule in hospital -Scheduled for dialysis this afternoon  2. HTN:  Blood pressure control is variable.  Currently getting 25 mg Lopressor daily Home regimen includes hydralazine 25 mg  twice a day, losartan 100 mg daily -We will start losartan in the evening  3. Anemia of chronic kidney disease:  Lab Results  Component Value Date   HGB 10.2 (L) 06/27/2020    - EPO as outpatient.  -Will monitor this level  4. Secondary Hyperparathyroidism:  Lab Results  Component Value Date   PTH 553 (H) 09/30/2015   CALCIUM 9.0 06/27/2020   CAION 1.08 (L) 05/21/2020   PHOS 5.0 (H) 10/30/2018    - sevelamer with meals TID  5. Covid-19 positive - Positive result-06/24/20 IV remdesivir as per hospitalist team 4 doses starting 2/18   LOS: 0 [place]   Patient was seen and evaluated with [place] with her note with edits as above  2/19/20222:37 PM

## 2020-06-27 NOTE — Progress Notes (Signed)
PROGRESS NOTE   HPI was taken from Dr. Damita Dunnings: Johnny [name]. is a 70 y.o. male with medical history significant for ESRD on HD MWF with recent placement on 06/09/2020 of left femoral vein tunneled dialysis catheter due to problems with AV fistula, as well as history of PAD status post right AKA, HTN, CAD, diastolic heart failure, who presents  for evaluation of shortness of breath. Patient was in his usual state of health  until he got home from dialysis on the day of arrival when he developed shortness of breath associated with a non productive cough. He denied chest pain, fever or chills. Denied vomiting, diarrhea or abdominal pain. ED Course: On arrival in the ED he was noted to be hypotensive at 84/56, with pulse of 91, afebrile, mild tachypnea of 20 with O2 sat 98% on room air. Blood work was for the most part unremarkable with WBC 9300, lactic acid 1.2, BNP 32, troponin 26>25. Procalcitonin was elevated at 3.38 but has been chronically elevated at 2-4 for the past few years. Covid positive, flu negative. EKG as reviewed by me : Sinus rhythm at 89 with no acute ST-T wave changes Imaging: Mild central vascular congestion without overt edema  Patient was given several gentle boluses of 250 mils to 500 mils for total of 1.25 L in the emergency room with only transient improvement in blood pressure. Was about to be discharged when recheck showed systolic in the [id number] at which point he was given an additional fluid bolus and hospitalist consulted for admission.   Hospital course from Dr. Jimmye Norman 2/17-2/18/22: Pt's dyspnea has resolved. Pt is still on IV remdesivir for [place]. Pt is not currently requiring supplemental oxygen. Of note, hypotension has resolved. PT has seen the and recs home health. HH orders were placed.    Johnny [name].  [id number] DOB: Dec 20, 1950 DOA: 06/24/2020 PCP: [name], MD    Assessment & Plan:   Active Problems:   ESRD on hemodialysis (H[facility])    Chronic diastolic CHF (congestive heart failure) ([place])   Above knee amputation of right lower extremity ([place])   COVID-19 virus infection   Hypotension of hemodialysis   Hypotension   Acute dyspnea: likely secondary to COVID19. CXR shows vascular congestion w/o overt edema. Not requiring supplemental oxygen. Resolved  COVID-19 virus infection: continue on IV remdesivir. Not currently hypoxic so will hold off of steroids at this time and continue to monitor closely.   Hypotension:  Maybe due to dialysis fluid removal. Today pt has elevated BP. --cont home metop --resume home losartan --hold home hydralazine for now  ESRD: on HD. W/ left femoral vein tunneled dialysis catheter, placed on 06/09/20. Management per nephro  --HD today  ACD: likely secondary to ESRD. No transfusion needed currently   Chronic diastolic CHF: CXR shows vascular congestion but clinically appears euvolemic. Monitor I/Os. Daily weights --volume removal with dialysis  PAD: w/ hx of AKA of RLE.  Continue on aspirin, statin --resume home plavix  Anxiety on chronic benzo --mood stable --resume home Klonopin as 0.5 mg BID PRN   DVT prophylaxis: heparin  Code Status: full Family Communication:  Disposition Plan: Home with HHPT  Level of care: Progressive Cardiac   Status is: Observation   Dispo: The patient is from: Home              Anticipated d/c is to: home with HHPT               Anticipated d/c date  is: tomorrow              Patient currently is medically stable to d/c.   Difficult to place patient No    Consultants:     Procedures:    Antimicrobials:   Subjective: Pt reported doing better.  No dyspnea.  Eating well.    Pt said he lives by himself and has been taking care of all his ADL's by himself for the past 12 years.  the health care POA is a friend who helps him with grocery shopping and paper work, but is not his caregiver. Pt gets around in his wheelchair, and said  transportation comes to get him for dialysis. Pt said he has no issues at all with going back home.  PT rec HHPT.   Objective: Vitals:   06/27/20 0336 06/27/20 0421 06/27/20 0838 06/27/20 1105  BP: (!) 141/69 (!) 130/56 138/70 (!) 172/84  Pulse: 93 94 92 91  Resp: '16 17  16  '$ Temp: 98.4 F (36.9 C) 98.3 F (36.8 C) 98.5 F (36.9 C) 98.1 F (36.7 C)  TempSrc: Oral Oral Oral Oral  SpO2: 99% 98% 98% 99%  Weight:  78.2 kg    Height:        Intake/Output Summary (Last 24 hours) at 06/27/2020 1645 Last data filed at 06/27/2020 1533 Gross per 24 hour  Intake 100 ml  Output --  Net 100 ml   Filed Weights   06/25/20 1436 06/26/20 0505 06/27/20 0421  Weight: 75.4 kg 76.9 kg 78.2 kg    Examination:  Constitutional: NAD, AAOx3 HEENT: conjunctivae and lids normal, EOMI CV: No cyanosis.   RESP: normal respiratory effort, on RA Extremities: No effusions, edema in LLE.  Right AKA. SKIN: warm, dry Neuro: II - XII grossly intact.   Psych: Normal mood and affect.  Appropriate judgement and reason   Data Reviewed: I have personally reviewed following labs and imaging studies  CBC: Recent Labs  Lab 06/24/20 2229 06/26/20 0732 06/27/20 0544  WBC 9.3 6.8 7.3  NEUTROABS 6.3  --   --   HGB 11.4* 10.7* 10.2*  HCT 36.7* 33.5* 32.7*  MCV 101.9* 100.9* 100.0  PLT 193 182 [id number]   Basic Metabolic Panel: Recent Labs  Lab 06/24/20 2320 06/26/20 0732 06/27/20 0544  NA 138 140 140  K 3.9 4.4 4.3  CL 102 103 102  CO2 '25 23 23  '$ GLUCOSE 95 77 81  BUN 14 23 31*  CREATININE 6.54* 9.33* 10.64*  CALCIUM 8.8* 9.1 9.0   GFR: Estimated Creatinine Clearance: 6.2 mL/min (A) (by C-G formula based on SCr of 10.64 mg/dL (H)). Liver Function Tests: Recent Labs  Lab 06/24/20 2320  AST 13*  ALT 7  ALKPHOS 51  BILITOT 0.4  PROT 6.7  ALBUMIN 3.4*   No results for input(s): LIPASE, AMYLASE in the last 168 hours. No results for input(s): AMMONIA in the last 168 hours. Coagulation  Profile: No results for input(s): INR, PROTIME in the last 168 hours. Cardiac Enzymes: No results for input(s): CKTOTAL, CKMB, CKMBINDEX, TROPONINI in the last 168 hours. BNP (last 3 results) No results for input(s): PROBNP in the last 8760 hours. HbA1C: No results for input(s): HGBA1C in the last 72 hours. CBG: No results for input(s): GLUCAP in the last 168 hours. Lipid Profile: No results for input(s): CHOL, HDL, LDLCALC, TRIG, CHOLHDL, LDLDIRECT in the last 72 hours. Thyroid Function Tests: No results for input(s): TSH, T4TOTAL, FREET4, T3FREE, THYROIDAB in the last 72  hours. Anemia Panel: No results for input(s): VITAMINB12, FOLATE, FERRITIN, TIBC, IRON, RETICCTPCT in the last 72 hours. Sepsis Labs: Recent Labs  Lab 06/24/20 2229 06/24/20 2320  PROCALCITON  --  3.38  LATICACIDVEN 1.2  --     Recent Results (from the past 240 hour(s))  Resp Panel by RT-PCR (Flu A&B, Covid) Nasopharyngeal Swab     Status: Abnormal   Collection Time: 06/24/20 10:28 PM   Specimen: Nasopharyngeal Swab; Nasopharyngeal(NP) swabs in vial transport medium  Result Value Ref Range Status   SARS Coronavirus 2 by RT PCR POSITIVE (A) NEGATIVE Final    Comment: RESULT CALLED TO, READ BACK BY AND VERIFIED WITH: KANDELL SHEETS '@0001'$  ON 06/25/20 SKL (NOTE) SARS-CoV-2 target nucleic acids are DETECTED.  The SARS-CoV-2 RNA is generally detectable in upper respiratory specimens during the acute phase of infection. Positive results are indicative of the presence of the identified virus, but do not rule out bacterial infection or co-infection with other pathogens not detected by the test. Clinical correlation with patient history and other diagnostic information is necessary to determine patient infection status. The expected result is Negative.  Fact Sheet for Patients: EntrepreneurPulse.com.au  Fact Sheet for Healthcare Providers: IncredibleEmployment.be  This test  is not yet approved or cleared by the [place] FDA and  has been authorized for detection and/or diagnosis of SARS-CoV-2 by FDA under an Emergency Use Authorization (EUA).  This EUA will remain in effect (meaning this test can  be used) for the duration of  the COVID-19 declaration under Section 564(b)(1) of the Act, 21 U.S.C. section 360bbb-3(b)(1), unless the authorization is terminated or revoked sooner.     Influenza A by PCR NEGATIVE NEGATIVE Final   Influenza B by PCR NEGATIVE NEGATIVE Final    Comment: (NOTE) The Xpert Xpress SARS-CoV-2/FLU/RSV plus assay is intended as an aid in the diagnosis of influenza from Nasopharyngeal swab specimens and should not be used as a sole basis for treatment. Nasal washings and aspirates are unacceptable for Xpert Xpress SARS-CoV-2/FLU/RSV testing.  Fact Sheet for Patients: EntrepreneurPulse.com.au  Fact Sheet for Healthcare Providers: IncredibleEmployment.be  This test is not yet approved or cleared by the [place] FDA and has been authorized for detection and/or diagnosis of SARS-CoV-2 by FDA under an Emergency Use Authorization (EUA). This EUA will remain in effect (meaning this test can be used) for the duration of the COVID-19 declaration under Section 564(b)(1) of the Act, 21 U.S.C. section 360bbb-3(b)(1), unless the authorization is terminated or revoked.  Performed at [facility], [address] [place], [place] [address]   CULTURE, BLOOD (ROUTINE X 2) w Reflex to ID Panel     Status: None (Preliminary result)   Collection Time: 06/25/20  5:03 AM   Specimen: BLOOD  Result Value Ref Range Status   Specimen Description BLOOD RIGHT FA  Final   Special Requests   Final    BOTTLES DRAWN AEROBIC AND ANAEROBIC Blood Culture adequate volume   Culture   Final    NO GROWTH 2 DAYS Performed at [facility], [address], [place], [place] [address]    Report Status  PENDING  Incomplete  CULTURE, BLOOD (ROUTINE X 2) w Reflex to ID Panel     Status: None (Preliminary result)   Collection Time: 06/25/20  5:03 AM   Specimen: BLOOD  Result Value Ref Range Status   Specimen Description BLOOD LEFT [facility]  Final   Special Requests   Final    BOTTLES DRAWN  AEROBIC AND ANAEROBIC Blood Culture results may not be optimal due to an excessive volume of blood received in culture bottles   Culture   Final    NO GROWTH 2 DAYS Performed at [facility], [address] [place], [place] [address]    Report Status PENDING  Incomplete         Radiology Studies: No results found.      Scheduled Meds: . albuterol  2 puff Inhalation Q6H  . vitamin C  500 mg Oral Daily  . Chlorhexidine Gluconate Cloth  6 each Topical Q0600  . Chlorhexidine Gluconate Cloth  6 each Topical Q0600  . heparin  5,000 Units Subcutaneous Q8H  . metoprolol tartrate  25 mg Oral Daily  . sodium chloride flush  3 mL Intravenous Q12H  . zinc sulfate  220 mg Oral Daily   Continuous Infusions: . sodium chloride    . sodium chloride    . remdesivir 100 mg in NS 100 mL 100 mg (06/27/20 0843)     LOS: 0 days     Enzo Bi, MD [facility] Pager [phone]  If 7PM-7AM, please contact night-coverage 06/27/2020, 4:45 PM

## 2020-06-28 DIAGNOSIS — F419 Anxiety disorder, unspecified: Secondary | ICD-10-CM | POA: Diagnosis present

## 2020-06-28 DIAGNOSIS — N186 End stage renal disease: Secondary | ICD-10-CM | POA: Diagnosis present

## 2020-06-28 DIAGNOSIS — G8929 Other chronic pain: Secondary | ICD-10-CM | POA: Diagnosis present

## 2020-06-28 DIAGNOSIS — R0600 Dyspnea, unspecified: Secondary | ICD-10-CM | POA: Diagnosis present

## 2020-06-28 DIAGNOSIS — I2510 Atherosclerotic heart disease of native coronary artery without angina pectoris: Secondary | ICD-10-CM | POA: Diagnosis present

## 2020-06-28 DIAGNOSIS — E1151 Type 2 diabetes mellitus with diabetic peripheral angiopathy without gangrene: Secondary | ICD-10-CM | POA: Diagnosis present

## 2020-06-28 DIAGNOSIS — I953 Hypotension of hemodialysis: Secondary | ICD-10-CM | POA: Diagnosis present

## 2020-06-28 DIAGNOSIS — M549 Dorsalgia, unspecified: Secondary | ICD-10-CM | POA: Diagnosis present

## 2020-06-28 DIAGNOSIS — E1122 Type 2 diabetes mellitus with diabetic chronic kidney disease: Secondary | ICD-10-CM | POA: Diagnosis present

## 2020-06-28 DIAGNOSIS — I252 Old myocardial infarction: Secondary | ICD-10-CM | POA: Diagnosis not present

## 2020-06-28 DIAGNOSIS — Z7982 Long term (current) use of aspirin: Secondary | ICD-10-CM | POA: Diagnosis not present

## 2020-06-28 DIAGNOSIS — Z87892 Personal history of anaphylaxis: Secondary | ICD-10-CM | POA: Diagnosis not present

## 2020-06-28 DIAGNOSIS — E785 Hyperlipidemia, unspecified: Secondary | ICD-10-CM | POA: Diagnosis present

## 2020-06-28 DIAGNOSIS — Z7902 Long term (current) use of antithrombotics/antiplatelets: Secondary | ICD-10-CM | POA: Diagnosis not present

## 2020-06-28 DIAGNOSIS — M109 Gout, unspecified: Secondary | ICD-10-CM | POA: Diagnosis present

## 2020-06-28 DIAGNOSIS — I429 Cardiomyopathy, unspecified: Secondary | ICD-10-CM | POA: Diagnosis present

## 2020-06-28 DIAGNOSIS — Z992 Dependence on renal dialysis: Secondary | ICD-10-CM

## 2020-06-28 DIAGNOSIS — I132 Hypertensive heart and chronic kidney disease with heart failure and with stage 5 chronic kidney disease, or end stage renal disease: Secondary | ICD-10-CM | POA: Diagnosis present

## 2020-06-28 DIAGNOSIS — Z87891 Personal history of nicotine dependence: Secondary | ICD-10-CM | POA: Diagnosis not present

## 2020-06-28 DIAGNOSIS — D631 Anemia in chronic kidney disease: Secondary | ICD-10-CM | POA: Diagnosis present

## 2020-06-28 DIAGNOSIS — I9589 Other hypotension: Secondary | ICD-10-CM | POA: Diagnosis not present

## 2020-06-28 DIAGNOSIS — E1140 Type 2 diabetes mellitus with diabetic neuropathy, unspecified: Secondary | ICD-10-CM | POA: Diagnosis present

## 2020-06-28 DIAGNOSIS — R0602 Shortness of breath: Secondary | ICD-10-CM | POA: Diagnosis present

## 2020-06-28 DIAGNOSIS — N2581 Secondary hyperparathyroidism of renal origin: Secondary | ICD-10-CM | POA: Diagnosis present

## 2020-06-28 DIAGNOSIS — I5032 Chronic diastolic (congestive) heart failure: Secondary | ICD-10-CM | POA: Diagnosis present

## 2020-06-28 DIAGNOSIS — U071 COVID-19: Secondary | ICD-10-CM | POA: Diagnosis present

## 2020-06-28 DIAGNOSIS — Z79899 Other long term (current) drug therapy: Secondary | ICD-10-CM | POA: Diagnosis not present

## 2020-06-28 LAB — BASIC METABOLIC PANEL
Anion gap: 14 (ref 5–15)
BUN: 22 mg/dL (ref 8–23)
CO2: 23 mmol/L (ref 22–32)
Calcium: 8.8 mg/dL — ABNORMAL LOW (ref 8.9–10.3)
Chloride: 97 mmol/L — ABNORMAL LOW (ref 98–111)
Creatinine, Ser: 7.91 mg/dL — ABNORMAL HIGH (ref 0.61–1.24)
GFR, Estimated: 7 mL/min — ABNORMAL LOW (ref >60)
Glucose, Bld: 81 mg/dL (ref 70–99)
Potassium: 4.2 mmol/L (ref 3.5–5.1)
Sodium: 134 mmol/L — ABNORMAL LOW (ref 135–145)

## 2020-06-28 LAB — CBC
HCT: 33.8 % — ABNORMAL LOW (ref 39.0–52.0)
Hemoglobin: 10.9 g/dL — ABNORMAL LOW (ref 13.0–17.0)
MCH: 31.5 pg (ref 26.0–34.0)
MCHC: 32.2 g/dL (ref 30.0–36.0)
MCV: 97.7 fL (ref 80.0–100.0)
Platelets: 162 10*3/uL (ref 150–400)
RBC: 3.46 MIL/uL — ABNORMAL LOW (ref 4.22–5.81)
RDW: 14.1 % (ref 11.5–15.5)
WBC: 7.4 10*3/uL (ref 4.0–10.5)
nRBC: 0 % (ref 0.0–0.2)

## 2020-06-28 LAB — MAGNESIUM: Magnesium: 1.5 mg/dL — ABNORMAL LOW (ref 1.7–2.4)

## 2020-06-28 MED ORDER — GABAPENTIN 100 MG PO CAPS: 200.0000 mg | ORAL_CAPSULE | Freq: Two times a day (BID) | ORAL | Status: DC

## 2020-06-28 MED ORDER — MAGNESIUM SULFATE 2 GM/50ML IV SOLN
2.0000 g | Freq: Once | INTRAVENOUS | Status: AC
  Administered 2020-06-28: 2 g via INTRAVENOUS
  Filled 2020-06-28: qty 50

## 2020-06-28 MED ORDER — LOSARTAN POTASSIUM 50 MG PO TABS: 100.0000 mg | ORAL_TABLET | Freq: Every day | ORAL | Status: DC

## 2020-06-28 MED ORDER — LOSARTAN POTASSIUM 100 MG PO TABS: ORAL_TABLET | ORAL | Status: DC

## 2020-06-28 MED ORDER — HYDRALAZINE HCL 25 MG PO TABS: ORAL_TABLET | ORAL | Status: DC

## 2020-06-28 NOTE — Progress Notes (Signed)
PROGRESS NOTE   HPI was taken from Dr. Damita Dunnings: Johnny [name]. is a 70 y.o. male with medical history significant for ESRD on HD MWF with recent placement on 06/09/2020 of left femoral vein tunneled dialysis catheter due to problems with AV fistula, as well as history of PAD status post right AKA, HTN, CAD, diastolic heart failure, who presents  for evaluation of shortness of breath. Patient was in his usual state of health  until he got home from dialysis on the day of arrival when he developed shortness of breath associated with a non productive cough. He denied chest pain, fever or chills. Denied vomiting, diarrhea or abdominal pain. ED Course: On arrival in the ED he was noted to be hypotensive at 84/56, with pulse of 91, afebrile, mild tachypnea of 20 with O2 sat 98% on room air. Blood work was for the most part unremarkable with WBC 9300, lactic acid 1.2, BNP 32, troponin 26>25. Procalcitonin was elevated at 3.38 but has been chronically elevated at 2-4 for the past few years. Covid positive, flu negative. EKG as reviewed by me : Sinus rhythm at 89 with no acute ST-T wave changes Imaging: Mild central vascular congestion without overt edema  Patient was given several gentle boluses of 250 mils to 500 mils for total of 1.25 L in the emergency room with only transient improvement in blood pressure. Was about to be discharged when recheck showed systolic in the [id number] at which point he was given an additional fluid bolus and hospitalist consulted for admission.   Hospital course from Dr. Jimmye Norman 2/17-2/18/22: Pt's dyspnea has resolved. Pt is still on IV remdesivir for [place]. Pt is not currently requiring supplemental oxygen. Of note, hypotension has resolved. PT has seen the and recs home health. HH orders were placed.    Johnny [name].  [id number] DOB: 09/03/1950 DOA: 06/24/2020 PCP: [name], MD    Assessment & Plan:   Active Problems:   ESRD on hemodialysis (HCC)    Chronic diastolic CHF (congestive heart failure) ([place])   Above knee amputation of right lower extremity ([facility])   COVID-19 virus infection   Hypotension of hemodialysis   Hypotension   Dyspnea   Acute dyspnea: likely secondary to COVID19. CXR shows vascular congestion w/o overt edema. Not requiring supplemental oxygen. Resolved  COVID-19 virus infection: continue on IV remdesivir. Not currently hypoxic so will hold off of steroids at this time and continue to monitor closely.   Hypotension, POA Maybe due to dialysis fluid removal. BP varied. --cont home metop --resume home losartan --hold home hydralazine for now  ESRD: on HD MWF. W/ left femoral vein tunneled dialysis catheter, placed on 06/09/20. Management per nephro  --due to COVID infection, will change dialysis to TTS until 2/26 when pt is out of isolation.  ACD: likely secondary to ESRD. No transfusion needed currently   Chronic diastolic CHF: CXR shows vascular congestion but clinically appears euvolemic. Monitor I/Os. Daily weights --volume removal with dialysis  PAD: w/ hx of AKA of RLE.  Continue on aspirin, statin, plavix  Anxiety on chronic benzo --mood stable --cont home Klonopin as 0.5 mg BID PRN   DVT prophylaxis: heparin  Code Status: full Family Communication:  Disposition Plan: Home with HHPT  Level of care: Med-Surg   Dispo: The patient is from: Home              Anticipated d/c is to: home with HHPT  Anticipated d/c date is: tomorrow              Patient currently is medically stable to d/c.  Can't discharge until transportation to outpatient dialysis at covid facility is secured on Monday.    Difficult to place patient No    Consultants:     Procedures:    Antimicrobials:   Subjective: Pt had no complaint today.  No dyspnea, no pain.  No N/V/D.     Objective: Vitals:   06/27/20 2145 06/28/20 0400 06/28/20 0732 06/28/20 1215  BP:  104/68 118/70   Pulse: 99 (!) 104  99   Resp: (!) 24 (!) 25 18   Temp:  98.6 F (37 C) 99.8 F (37.7 C) 99 F (37.2 C)  TempSrc:   Oral Oral  SpO2: 100% 96% 98% 94%  Weight:  75.3 kg    Height:        Intake/Output Summary (Last 24 hours) at 06/28/2020 1435 Last data filed at 06/27/2020 2130 Gross per 24 hour  Intake 100 ml  Output 1500 ml  Net -1400 ml   Filed Weights   06/26/20 0505 06/27/20 0421 06/28/20 0400  Weight: 76.9 kg 78.2 kg 75.3 kg    Examination:  Constitutional: NAD, AAOx3 HEENT: conjunctivae and lids normal, EOMI CV: No cyanosis.   RESP: normal respiratory effort, on RA Extremities: No effusions, edema in LLE.  Right AKA. SKIN: warm, dry Neuro: II - XII grossly intact.   Psych: Normal mood and affect.     Data Reviewed: I have personally reviewed following labs and imaging studies  CBC: Recent Labs  Lab 06/24/20 2229 06/26/20 0732 06/27/20 0544 06/27/20 1614 06/28/20 0623  WBC 9.3 6.8 7.3 9.8 7.4  NEUTROABS 6.3  --   --   --   --   HGB 11.4* 10.7* 10.2* 11.7* 10.9*  HCT 36.7* 33.5* 32.7* 37.8* 33.8*  MCV 101.9* 100.9* 100.0 102.4* 97.7  PLT 193 182 174 192 0000000   Basic Metabolic Panel: Recent Labs  Lab 06/24/20 2320 06/26/20 0732 06/27/20 0544 06/28/20 0623  NA 138 140 140 134*  K 3.9 4.4 4.3 4.2  CL 102 103 102 97*  CO2 '25 23 23 23  '$ GLUCOSE 95 77 81 81  BUN 14 23 31* 22  CREATININE 6.54* 9.33* 10.64* 7.91*  CALCIUM 8.8* 9.1 9.0 8.8*  MG  --   --   --  1.5*   GFR: Estimated Creatinine Clearance: 8.3 mL/min (A) (by C-G formula based on SCr of 7.91 mg/dL (H)). Liver Function Tests: Recent Labs  Lab 06/24/20 2320  AST 13*  ALT 7  ALKPHOS 51  BILITOT 0.4  PROT 6.7  ALBUMIN 3.4*   No results for input(s): LIPASE, AMYLASE in the last 168 hours. No results for input(s): AMMONIA in the last 168 hours. Coagulation Profile: No results for input(s): INR, PROTIME in the last 168 hours. Cardiac Enzymes: No results for input(s): CKTOTAL, CKMB, CKMBINDEX, TROPONINI  in the last 168 hours. BNP (last 3 results) No results for input(s): PROBNP in the last 8760 hours. HbA1C: No results for input(s): HGBA1C in the last 72 hours. CBG: No results for input(s): GLUCAP in the last 168 hours. Lipid Profile: No results for input(s): CHOL, HDL, LDLCALC, TRIG, CHOLHDL, LDLDIRECT in the last 72 hours. Thyroid Function Tests: No results for input(s): TSH, T4TOTAL, FREET4, T3FREE, THYROIDAB in the last 72 hours. Anemia Panel: No results for input(s): VITAMINB12, FOLATE, FERRITIN, TIBC, IRON, RETICCTPCT in the last 72  hours. Sepsis Labs: Recent Labs  Lab 06/24/20 2229 06/24/20 2320  PROCALCITON  --  3.38  LATICACIDVEN 1.2  --     Recent Results (from the past 240 hour(s))  Resp Panel by RT-PCR (Flu A&B, Covid) Nasopharyngeal Swab     Status: Abnormal   Collection Time: 06/24/20 10:28 PM   Specimen: Nasopharyngeal Swab; Nasopharyngeal(NP) swabs in vial transport medium  Result Value Ref Range Status   SARS Coronavirus 2 by RT PCR POSITIVE (A) NEGATIVE Final    Comment: RESULT CALLED TO, READ BACK BY AND VERIFIED WITH: KANDELL SHEETS '@0001'$  ON 06/25/20 SKL (NOTE) SARS-CoV-2 target nucleic acids are DETECTED.  The SARS-CoV-2 RNA is generally detectable in upper respiratory specimens during the acute phase of infection. Positive results are indicative of the presence of the identified virus, but do not rule out bacterial infection or co-infection with other pathogens not detected by the test. Clinical correlation with patient history and other diagnostic information is necessary to determine patient infection status. The expected result is Negative.  Fact Sheet for Patients: EntrepreneurPulse.com.au  Fact Sheet for Healthcare Providers: IncredibleEmployment.be  This test is not yet approved or cleared by the [place] FDA and  has been authorized for detection and/or diagnosis of SARS-CoV-2 by FDA under an  Emergency Use Authorization (EUA).  This EUA will remain in effect (meaning this test can  be used) for the duration of  the COVID-19 declaration under Section 564(b)(1) of the Act, 21 U.S.C. section 360bbb-3(b)(1), unless the authorization is terminated or revoked sooner.     Influenza A by PCR NEGATIVE NEGATIVE Final   Influenza B by PCR NEGATIVE NEGATIVE Final    Comment: (NOTE) The Xpert Xpress SARS-CoV-2/FLU/RSV plus assay is intended as an aid in the diagnosis of influenza from Nasopharyngeal swab specimens and should not be used as a sole basis for treatment. Nasal washings and aspirates are unacceptable for Xpert Xpress SARS-CoV-2/FLU/RSV testing.  Fact Sheet for Patients: EntrepreneurPulse.com.au  Fact Sheet for Healthcare Providers: IncredibleEmployment.be  This test is not yet approved or cleared by the [place] FDA and has been authorized for detection and/or diagnosis of SARS-CoV-2 by FDA under an Emergency Use Authorization (EUA). This EUA will remain in effect (meaning this test can be used) for the duration of the COVID-19 declaration under Section 564(b)(1) of the Act, 21 U.S.C. section 360bbb-3(b)(1), unless the authorization is terminated or revoked.  Performed at [facility], [address] [place], [place] [address]   CULTURE, BLOOD (ROUTINE X 2) w Reflex to ID Panel     Status: None (Preliminary result)   Collection Time: 06/25/20  5:03 AM   Specimen: BLOOD  Result Value Ref Range Status   Specimen Description BLOOD RIGHT FA  Final   Special Requests   Final    BOTTLES DRAWN AEROBIC AND ANAEROBIC Blood Culture adequate volume   Culture   Final    NO GROWTH 3 DAYS Performed at [facility], [address] [place], [place] [address]    Report Status PENDING  Incomplete  CULTURE, BLOOD (ROUTINE X 2) w Reflex to ID Panel     Status: None (Preliminary result)   Collection Time: 06/25/20  5:03  AM   Specimen: BLOOD  Result Value Ref Range Status   Specimen Description BLOOD LEFT AC  Final   Special Requests   Final    BOTTLES DRAWN AEROBIC AND ANAEROBIC Blood Culture results may not be optimal due to an excessive volume of blood  received in culture bottles   Culture   Final    NO GROWTH 3 DAYS Performed at [facility], [address] [place], [place] [address]    Report Status PENDING  Incomplete         Radiology Studies: No results found.      Scheduled Meds: . albuterol  2 puff Inhalation Q6H  . allopurinol  100 mg Oral Daily  . vitamin C  500 mg Oral Daily  . aspirin EC  81 mg Oral Daily  . atorvastatin  10 mg Oral Daily  . Chlorhexidine Gluconate Cloth  6 each Topical Q0600  . Chlorhexidine Gluconate Cloth  6 each Topical Q0600  . clopidogrel  75 mg Oral Daily  . gabapentin  200 mg Oral BID  . heparin  5,000 Units Subcutaneous Q8H  . losartan  100 mg Oral Daily  . metoprolol tartrate  25 mg Oral Daily  . sevelamer carbonate  2,400 mg Oral TID WC  . sodium chloride flush  3 mL Intravenous Q12H  . zinc sulfate  220 mg Oral Daily   Continuous Infusions: . remdesivir 100 mg in NS 100 mL 100 mg (06/28/20 0931)     LOS: 0 days     Enzo Bi, MD [facility] Pager [phone]  If 7PM-7AM, please contact night-coverage 06/28/2020, 2:35 PM

## 2020-06-28 NOTE — Progress Notes (Signed)
[facility]  ROUNDING NOTE   Subjective:   [name] was admitted to [facility] on 06/24/2020 for SOB (shortness of breath) [R06.02] ESRD (end stage renal disease) ([place]) [N18.6] Hypotension [I95.9] Hypotension, unspecified hypotension type [I95.9] COVID-19 [U07.1] Dyspnea [R06.00]  Patient was diagnosed with COVID-19+ (06/24/2020)  Patient is sitting on side of bed Alert and oriented Preparing to eat breakfast Denies shortness of breath and chest pain Eager for discharge planned today    Objective:  Vital signs in last 24 hours:  Temp:  [98 F (36.7 C)-99.8 F (37.7 C)] 99 F (37.2 C) (02/20 1215) Pulse Rate:  [72-104] 99 (02/20 0732) Resp:  [15-27] 18 (02/20 0732) BP: (104-171)/(68-106) 118/70 (02/20 0732) SpO2:  [94 %-100 %] 94 % (02/20 1215) Weight:  [75.3 kg] 75.3 kg (02/20 0400)  Weight change: -2.948 kg Filed Weights   06/26/20 0505 06/27/20 0421 06/28/20 0400  Weight: 76.9 kg 78.2 kg 75.3 kg    Intake/Output: I/O last 3 completed shifts: In: 100 [IV Piggyback:100] Out: 1500 [Other:1500]   Intake/Output this shift:  No intake/output data recorded.  Physical Exam: General: NAD,   Head: Normocephalic, atraumatic. Moist oral mucosal membranes  Eyes: Anicteric,  Lungs:  Clear to auscultation  Heart: Regular rate and rhythm  Abdomen:  Soft, nontender,   Extremities: no peripheral edema.rt bKA  Neurologic: Nonfocal,alert, oriented  Access: Left femoral permcath    Basic Metabolic Panel: Recent Labs  Lab 06/24/20 2320 06/26/20 0732 06/27/20 0544 06/28/20 0623  NA 138 140 140 134*  K 3.9 4.4 4.3 4.2  CL 102 103 102 97*  CO2 '25 23 23 23  '$ GLUCOSE 95 77 81 81  BUN 14 23 31* 22  CREATININE 6.54* 9.33* 10.64* 7.91*  CALCIUM 8.8* 9.1 9.0 8.8*  MG  --   --   --  1.5*    Liver Function Tests: Recent Labs  Lab 06/24/20 2320  AST 13*  ALT 7  ALKPHOS 51  BILITOT 0.4  PROT 6.7  ALBUMIN 3.4*   No results for input(s):  LIPASE, AMYLASE in the last 168 hours. No results for input(s): AMMONIA in the last 168 hours.  CBC: Recent Labs  Lab 06/24/20 2229 06/26/20 0732 06/27/20 0544 06/27/20 1614 06/28/20 0623  WBC 9.3 6.8 7.3 9.8 7.4  NEUTROABS 6.3  --   --   --   --   HGB 11.4* 10.7* 10.2* 11.7* 10.9*  HCT 36.7* 33.5* 32.7* 37.8* 33.8*  MCV 101.9* 100.9* 100.0 102.4* 97.7  PLT 193 182 174 192 162    Cardiac Enzymes: No results for input(s): CKTOTAL, CKMB, CKMBINDEX, TROPONINI in the last 168 hours.  BNP: Invalid input(s): POCBNP  CBG: No results for input(s): GLUCAP in the last 168 hours.  Microbiology: Results for orders placed or performed during the hospital encounter of 06/24/20  Resp Panel by RT-PCR (Flu A&B, Covid) Nasopharyngeal Swab     Status: Abnormal   Collection Time: 06/24/20 10:28 PM   Specimen: Nasopharyngeal Swab; Nasopharyngeal(NP) swabs in vial transport medium  Result Value Ref Range Status   SARS Coronavirus 2 by RT PCR POSITIVE (A) NEGATIVE Final    Comment: RESULT CALLED TO, READ BACK BY AND VERIFIED WITH: KANDELL SHEETS '@0001'$  ON 06/25/20 SKL (NOTE) SARS-CoV-2 target nucleic acids are DETECTED.  The SARS-CoV-2 RNA is generally detectable in upper respiratory specimens during the acute phase of infection. Positive results are indicative of the presence of the identified virus, but do not rule out bacterial infection  or co-infection with other pathogens not detected by the test. Clinical correlation with patient history and other diagnostic information is necessary to determine patient infection status. The expected result is Negative.  Fact Sheet for Patients: EntrepreneurPulse.com.au  Fact Sheet for Healthcare Providers: IncredibleEmployment.be  This test is not yet approved or cleared by the [place] FDA and  has been authorized for detection and/or diagnosis of SARS-CoV-2 by FDA under an Emergency Use Authorization  (EUA).  This EUA will remain in effect (meaning this test can  be used) for the duration of  the COVID-19 declaration under Section 564(b)(1) of the Act, 21 U.S.C. section 360bbb-3(b)(1), unless the authorization is terminated or revoked sooner.     Influenza A by PCR NEGATIVE NEGATIVE Final   Influenza B by PCR NEGATIVE NEGATIVE Final    Comment: (NOTE) The Xpert Xpress SARS-CoV-2/FLU/RSV plus assay is intended as an aid in the diagnosis of influenza from Nasopharyngeal swab specimens and should not be used as a sole basis for treatment. Nasal washings and aspirates are unacceptable for Xpert Xpress SARS-CoV-2/FLU/RSV testing.  Fact Sheet for Patients: EntrepreneurPulse.com.au  Fact Sheet for Healthcare Providers: IncredibleEmployment.be  This test is not yet approved or cleared by the [place] FDA and has been authorized for detection and/or diagnosis of SARS-CoV-2 by FDA under an Emergency Use Authorization (EUA). This EUA will remain in effect (meaning this test can be used) for the duration of the COVID-19 declaration under Section 564(b)(1) of the Act, 21 U.S.C. section 360bbb-3(b)(1), unless the authorization is terminated or revoked.  Performed at [facility], [address] [place], [place] [address]   CULTURE, BLOOD (ROUTINE X 2) w Reflex to ID Panel     Status: None (Preliminary result)   Collection Time: 06/25/20  5:03 AM   Specimen: BLOOD  Result Value Ref Range Status   Specimen Description BLOOD RIGHT FA  Final   Special Requests   Final    BOTTLES DRAWN AEROBIC AND ANAEROBIC Blood Culture adequate volume   Culture   Final    NO GROWTH 3 DAYS Performed at [facility], [address] [place],  [address]    Report Status PENDING  Incomplete  CULTURE, BLOOD (ROUTINE X 2) w Reflex to ID Panel     Status: None (Preliminary result)   Collection Time: 06/25/20  5:03 AM   Specimen: BLOOD   Result Value Ref Range Status   Specimen Description BLOOD LEFT AC  Final   Special Requests   Final    BOTTLES DRAWN AEROBIC AND ANAEROBIC Blood Culture results may not be optimal due to an excessive volume of blood received in culture bottles   Culture   Final    NO GROWTH 3 DAYS Performed at [facility], [address] [place],  [address]    Report Status PENDING  Incomplete    Coagulation Studies: No results for input(s): LABPROT, INR in the last 72 hours.  Urinalysis: No results for input(s): COLORURINE, LABSPEC, PHURINE, GLUCOSEU, HGBUR, BILIRUBINUR, KETONESUR, PROTEINUR, UROBILINOGEN, NITRITE, LEUKOCYTESUR in the last 72 hours.  Invalid input(s): APPERANCEUR    Imaging: No results found.   Medications:   . remdesivir 100 mg in NS 100 mL 100 mg (06/28/20 0931)   . albuterol  2 puff Inhalation Q6H  . allopurinol  100 mg Oral Daily  . vitamin C  500 mg Oral Daily  . aspirin EC  81 mg Oral Daily  . atorvastatin  10 mg Oral Daily  .  Chlorhexidine Gluconate Cloth  6 each Topical Q0600  . Chlorhexidine Gluconate Cloth  6 each Topical Q0600  . clopidogrel  75 mg Oral Daily  . gabapentin  200 mg Oral BID  . heparin  5,000 Units Subcutaneous Q8H  . losartan  100 mg Oral Daily  . metoprolol tartrate  25 mg Oral Daily  . sevelamer carbonate  2,400 mg Oral TID WC  . sodium chloride flush  3 mL Intravenous Q12H  . zinc sulfate  220 mg Oral Daily   acetaminophen, chlorpheniramine-HYDROcodone, clonazePAM, guaiFENesin-dextromethorphan, ondansetron **OR** ondansetron (ZOFRAN) IV  Assessment/ Plan:  [name] is a 70 y.o. black male with end stage renal disease on hemodialysis, peripheral vascular disease status post right AKA, coronary artery disease, hypertension, hyperlipidemia, congestive heart failure who is admitted to [facility] on 06/24/2020 for SOB (shortness of breath) [R06.02] ESRD (end stage renal disease) ([place]) [N18.6] Hypotension  [I95.9] Hypotension, unspecified hypotension type [I95.9] COVID-19 [U07.1] Dyspnea [R06.00] Found to have COVID-19+ with cough and shortness of breath. Patient is vaccinated.   [facility] MWF [place] left femoral permcath 79kg  1. End Stage renal disease: MWF.  -patient informed of hospital dialysis covid schedule -he is agreeable to TTS dialysis schedule in hospital -patient says he has transportation arranged for [facility] on Tuesday and Thursday, but not Saturday.  -When asked if his transportation knows he is Covid positive and if they will still provide transportation. Patient says no and he wouldn't have transportation if they knew. He receives transportation to his current clinic. Patient agrees to stay one night until we can make transportation arrangement for this week of dialysis.  -[facility] will end on 07/04/20 and patient can return to his regular clinic at that time.  -TOC and primary team informed of this plan  2. HTN:  Blood pressure control is variable.  Currently getting 25 mg Lopressor daily Home regimen includes hydralazine 25 mg twice a day, losartan 100 mg daily -We will start losartan in the evening BP controlled at this time  3. Anemia of chronic kidney disease:  Lab Results  Component Value Date   HGB 10.9 (L) 06/28/2020    - EPO as outpatient.  -Will monitor this level  4. Secondary Hyperparathyroidism:  Lab Results  Component Value Date   PTH 553 (H) 09/30/2015   CALCIUM 8.8 (L) 06/28/2020   CAION 1.08 (L) 05/21/2020   PHOS 5.0 (H) 10/30/2018    - sevelamer with meals TID  5. Covid-19 positive - Positive result-06/24/20 IV remdesivir as per hospitalist team 4 doses starting 2/18   LOS: 0 [place]   Patient was seen and evaluated with [place] with her note with edits as above  2/20/20221:59 PM

## 2020-06-28 NOTE — TOC Progression Note (Addendum)
Transition of Care (TOC) - Progression Note    Patient Details  Name: Johnny [name]. MRN: [id number] Date of Birth: 12/19/1950  Transition of Care [facility]) CM/SW Contact  Zigmund Daniel Dorian Pod, RN Phone Number:[phone] 06/28/2020, 12:40 PM  Clinical Narrative:    *Requested taxi voucher initially-provided. *Contact to pt, [name] [phone] and Vianne Bulls (left voice message) concerning pt's discharge today-Brenda Glass confirm she would be available at this home. *Due to risk via taxis transport TOC RN offered to call EMS for non-emergency to residence-[facility] called for pick up 1:00 PM today. Pt states he had transportation to dialysis on T/TH however issues with the weekends (Saturday) would be a problem getting to [place]. Further investigation pt indicated his previous ride may not be willing to transport him to the [facility] (center for covid pts) due to covid status.Team discussion with Dr. Billie Ruddy, Dr. Candiss Norse, the bedside RN and [facility] RN. Dr. Candiss Norse does not wish to discharge this pt to his residence with no confirmed transportation to the [facility] dialysis center on his dialysis days. Pt will not be discharged today as EMS cancelled and [name] made aware.  TOC supervisors Jolene Provost and Andreas Newport will be made aware to follow up with the [facility] dialysis center Monday 06/29/2020 in an attempt to arrange pt's transportation to the [facility] location over the next 1-2 weeks instead of the [place] location.    No other request or inquires concerning this case. TOC team will continue to follow for discharge needs.     Expected Discharge Plan and Services           Expected Discharge Date: 06/28/20                                     Social Determinants of Health (SDOH) Interventions    Readmission Risk Interventions No flowsheet data found.

## 2020-06-29 ENCOUNTER — Encounter: Payer: Self-pay | Admitting: Hospitalist

## 2020-06-29 DIAGNOSIS — I9589 Other hypotension: Secondary | ICD-10-CM | POA: Diagnosis not present

## 2020-06-29 DIAGNOSIS — U071 COVID-19: Secondary | ICD-10-CM | POA: Diagnosis not present

## 2020-06-29 LAB — BASIC METABOLIC PANEL
Anion gap: 14 (ref 5–15)
BUN: 38 mg/dL — ABNORMAL HIGH (ref 8–23)
CO2: 24 mmol/L (ref 22–32)
Calcium: 8.8 mg/dL — ABNORMAL LOW (ref 8.9–10.3)
Chloride: 97 mmol/L — ABNORMAL LOW (ref 98–111)
Creatinine, Ser: 10.03 mg/dL — ABNORMAL HIGH (ref 0.61–1.24)
GFR, Estimated: 5 mL/min — ABNORMAL LOW (ref >60)
Glucose, Bld: 72 mg/dL (ref 70–99)
Potassium: 4.5 mmol/L (ref 3.5–5.1)
Sodium: 135 mmol/L (ref 135–145)

## 2020-06-29 LAB — CBC
HCT: 30.5 % — ABNORMAL LOW (ref 39.0–52.0)
Hemoglobin: 9.9 g/dL — ABNORMAL LOW (ref 13.0–17.0)
MCH: 31.5 pg (ref 26.0–34.0)
MCHC: 32.5 g/dL (ref 30.0–36.0)
MCV: 97.1 fL (ref 80.0–100.0)
Platelets: 168 10*3/uL (ref 150–400)
RBC: 3.14 MIL/uL — ABNORMAL LOW (ref 4.22–5.81)
RDW: 14 % (ref 11.5–15.5)
WBC: 7.9 10*3/uL (ref 4.0–10.5)
nRBC: 0 % (ref 0.0–0.2)

## 2020-06-29 LAB — MAGNESIUM: Magnesium: 1.8 mg/dL (ref 1.7–2.4)

## 2020-06-29 NOTE — Progress Notes (Signed)
PROGRESS NOTE   HPI was taken from Dr. Damita Dunnings: Johnny [name]. is a 70 y.o. male with medical history significant for ESRD on HD MWF with recent placement on 06/09/2020 of left femoral vein tunneled dialysis catheter due to problems with AV fistula, as well as history of PAD status post right AKA, HTN, CAD, diastolic heart failure, who presents  for evaluation of shortness of breath. Patient was in his usual state of health  until he got home from dialysis on the day of arrival when he developed shortness of breath associated with a non productive cough. He denied chest pain, fever or chills. Denied vomiting, diarrhea or abdominal pain. ED Course: On arrival in the ED he was noted to be hypotensive at 84/56, with pulse of 91, afebrile, mild tachypnea of 20 with O2 sat 98% on room air. Blood work was for the most part unremarkable with WBC 9300, lactic acid 1.2, BNP 32, troponin 26>25. Procalcitonin was elevated at 3.38 but has been chronically elevated at 2-4 for the past few years. Covid positive, flu negative. EKG as reviewed by me : Sinus rhythm at 89 with no acute ST-T wave changes Imaging: Mild central vascular congestion without overt edema  Patient was given several gentle boluses of 250 mils to 500 mils for total of 1.25 L in the emergency room with only transient improvement in blood pressure. Was about to be discharged when recheck showed systolic in the [id number] at which point he was given an additional fluid bolus and hospitalist consulted for admission.   Hospital course from Dr. Jimmye Norman 2/17-2/18/22: Pt's dyspnea has resolved. Pt is still on IV remdesivir for [place]. Pt is not currently requiring supplemental oxygen. Of note, hypotension has resolved. PT has seen the and recs home health. HH orders were placed.    Johnny [name].  [id number] DOB: 09/22/1950 DOA: 06/24/2020 PCP: [name], MD    Assessment & Plan:   Active Problems:   ESRD on hemodialysis (HCC)    Chronic diastolic CHF (congestive heart failure) ([place])   Above knee amputation of right lower extremity ([place])   COVID-19 virus infection   Hypotension of hemodialysis   Hypotension   Dyspnea   Acute dyspnea: likely secondary to COVID19. CXR shows vascular congestion w/o overt edema. Not requiring supplemental oxygen. Resolved  COVID-19 virus infection:  --completed IV remdesivir. Not currently hypoxic so will hold off of steroids at this time and continue to monitor closely.   Hypotension, POA Maybe due to dialysis fluid removal. BP varied. --cont home metop --Hold home losartan for now --hold home hydralazine for now  ESRD: on HD MWF. W/ left femoral vein tunneled dialysis catheter, placed on 06/09/20. Management per nephro  --due to COVID infection, will change dialysis to TTS until 2/26 when pt is out of isolation.  ACD: likely secondary to ESRD. No transfusion needed currently   Chronic diastolic CHF:  CXR shows vascular congestion but clinically appears euvolemic. Monitor I/Os. Daily weights --volume removal with dialysis  PAD: w/ hx of AKA of RLE.  Continue on aspirin, statin, plavix  Anxiety on chronic benzo --mood stable --cont home Klonopin as 0.5 mg BID PRN   DVT prophylaxis: heparin  Code Status: full Family Communication:  Disposition Plan: Home with HHPT  Level of care: Med-Surg   Dispo: The patient is from: Home              Anticipated d/c is to: home with HHPT  Anticipated d/c date is: tomorrow              Patient currently is medically stable to d/c.  Can't discharge until after dialysis tomorrow due to no transportation to outpatient dialysis center.    Difficult to place patient No    Consultants:     Procedures:    Antimicrobials:   Subjective: Pt reported feeling well, no dyspnea.  Pt was ready to go home, however, couldn't discharge due to not being able to set up transportation for outpatient dialysis.      Objective: Vitals:   06/29/20 0524 06/29/20 0817 06/29/20 0900 06/29/20 1231  BP: (!) 81/33 (!) 95/58 (!) 128/48 (!) 134/51  Pulse: 89 90  95  Resp:  18  19  Temp:  97.8 F (36.6 C)  97.9 F (36.6 C)  TempSrc:  Oral  Oral  SpO2:  95%  98%  Weight:      Height:        Intake/Output Summary (Last 24 hours) at 06/29/2020 1642 Last data filed at 06/29/2020 0935 Gross per 24 hour  Intake 410 ml  Output --  Net 410 ml   Filed Weights   06/26/20 0505 06/27/20 0421 06/28/20 0400  Weight: 76.9 kg 78.2 kg 75.3 kg    Examination:  Constitutional: NAD, AAOx3, sitting at side of bed HEENT: conjunctivae and lids normal, EOMI CV: No cyanosis.   RESP: normal respiratory effort, on RA Extremities: No effusions, edema in LLE.  Right AKA. SKIN: warm, dry Neuro: II - XII grossly intact.   Psych: Normal mood and affect.  Appropriate judgement and reason    Data Reviewed: I have personally reviewed following labs and imaging studies  CBC: Recent Labs  Lab 06/24/20 2229 06/26/20 0732 06/27/20 0544 06/27/20 1614 06/28/20 0623 06/29/20 0545  WBC 9.3 6.8 7.3 9.8 7.4 7.9  NEUTROABS 6.3  --   --   --   --   --   HGB 11.4* 10.7* 10.2* 11.7* 10.9* 9.9*  HCT 36.7* 33.5* 32.7* 37.8* 33.8* 30.5*  MCV 101.9* 100.9* 100.0 102.4* 97.7 97.1  PLT 193 182 174 192 162 [id number]   Basic Metabolic Panel: Recent Labs  Lab 06/24/20 2320 06/26/20 0732 06/27/20 0544 06/28/20 0623 06/29/20 0545  NA 138 140 140 134* 135  K 3.9 4.4 4.3 4.2 4.5  CL 102 103 102 97* 97*  CO2 '25 23 23 23 24  '$ GLUCOSE 95 77 81 81 72  BUN 14 23 31* 22 38*  CREATININE 6.54* 9.33* 10.64* 7.91* 10.03*  CALCIUM 8.8* 9.1 9.0 8.8* 8.8*  MG  --   --   --  1.5* 1.8   GFR: Estimated Creatinine Clearance: 6.5 mL/min (A) (by C-G formula based on SCr of 10.03 mg/dL (H)). Liver Function Tests: Recent Labs  Lab 06/24/20 2320  AST 13*  ALT 7  ALKPHOS 51  BILITOT 0.4  PROT 6.7  ALBUMIN 3.4*   No results for input(s):  LIPASE, AMYLASE in the last 168 hours. No results for input(s): AMMONIA in the last 168 hours. Coagulation Profile: No results for input(s): INR, PROTIME in the last 168 hours. Cardiac Enzymes: No results for input(s): CKTOTAL, CKMB, CKMBINDEX, TROPONINI in the last 168 hours. BNP (last 3 results) No results for input(s): PROBNP in the last 8760 hours. HbA1C: No results for input(s): HGBA1C in the last 72 hours. CBG: No results for input(s): GLUCAP in the last 168 hours. Lipid Profile: No results for input(s): CHOL, HDL, LDLCALC,  TRIG, CHOLHDL, LDLDIRECT in the last 72 hours. Thyroid Function Tests: No results for input(s): TSH, T4TOTAL, FREET4, T3FREE, THYROIDAB in the last 72 hours. Anemia Panel: No results for input(s): VITAMINB12, FOLATE, FERRITIN, TIBC, IRON, RETICCTPCT in the last 72 hours. Sepsis Labs: Recent Labs  Lab 06/24/20 2229 06/24/20 2320  PROCALCITON  --  3.38  LATICACIDVEN 1.2  --     Recent Results (from the past 240 hour(s))  Resp Panel by RT-PCR (Flu A&B, Covid) Nasopharyngeal Swab     Status: Abnormal   Collection Time: 06/24/20 10:28 PM   Specimen: Nasopharyngeal Swab; Nasopharyngeal(NP) swabs in vial transport medium  Result Value Ref Range Status   SARS Coronavirus 2 by RT PCR POSITIVE (A) NEGATIVE Final    Comment: RESULT CALLED TO, READ BACK BY AND VERIFIED WITH: KANDELL SHEETS '@0001'$  ON 06/25/20 SKL (NOTE) SARS-CoV-2 target nucleic acids are DETECTED.  The SARS-CoV-2 RNA is generally detectable in upper respiratory specimens during the acute phase of infection. Positive results are indicative of the presence of the identified virus, but do not rule out bacterial infection or co-infection with other pathogens not detected by the test. Clinical correlation with patient history and other diagnostic information is necessary to determine patient infection status. The expected result is Negative.  Fact Sheet for  Patients: EntrepreneurPulse.com.au  Fact Sheet for Healthcare Providers: IncredibleEmployment.be  This test is not yet approved or cleared by the [place] FDA and  has been authorized for detection and/or diagnosis of SARS-CoV-2 by FDA under an Emergency Use Authorization (EUA).  This EUA will remain in effect (meaning this test can  be used) for the duration of  the COVID-19 declaration under Section 564(b)(1) of the Act, 21 U.S.C. section 360bbb-3(b)(1), unless the authorization is terminated or revoked sooner.     Influenza A by PCR NEGATIVE NEGATIVE Final   Influenza B by PCR NEGATIVE NEGATIVE Final    Comment: (NOTE) The Xpert Xpress SARS-CoV-2/FLU/RSV plus assay is intended as an aid in the diagnosis of influenza from Nasopharyngeal swab specimens and should not be used as a sole basis for treatment. Nasal washings and aspirates are unacceptable for Xpert Xpress SARS-CoV-2/FLU/RSV testing.  Fact Sheet for Patients: EntrepreneurPulse.com.au  Fact Sheet for Healthcare Providers: IncredibleEmployment.be  This test is not yet approved or cleared by the [place] FDA and has been authorized for detection and/or diagnosis of SARS-CoV-2 by FDA under an Emergency Use Authorization (EUA). This EUA will remain in effect (meaning this test can be used) for the duration of the COVID-19 declaration under Section 564(b)(1) of the Act, 21 U.S.C. section 360bbb-3(b)(1), unless the authorization is terminated or revoked.  Performed at [facility], [address] [place], [place] [address]   CULTURE, BLOOD (ROUTINE X 2) w Reflex to ID Panel     Status: None (Preliminary result)   Collection Time: 06/25/20  5:03 AM   Specimen: BLOOD  Result Value Ref Range Status   Specimen Description BLOOD RIGHT FA  Final   Special Requests   Final    BOTTLES DRAWN AEROBIC AND ANAEROBIC Blood Culture  adequate volume   Culture   Final    NO GROWTH 4 DAYS Performed at [facility], [address] [place], [place] [address]    Report Status PENDING  Incomplete  CULTURE, BLOOD (ROUTINE X 2) w Reflex to ID Panel     Status: None (Preliminary result)   Collection Time: 06/25/20  5:03 AM   Specimen: BLOOD  Result Value  Ref Range Status   Specimen Description BLOOD LEFT AC  Final   Special Requests   Final    BOTTLES DRAWN AEROBIC AND ANAEROBIC Blood Culture results may not be optimal due to an excessive volume of blood received in culture bottles   Culture   Final    NO GROWTH 4 DAYS Performed at [facility], [address], [place], [place] [address]    Report Status PENDING  Incomplete         Radiology Studies: No results found.      Scheduled Meds: . albuterol  2 puff Inhalation Q6H  . allopurinol  100 mg Oral Daily  . vitamin C  500 mg Oral Daily  . aspirin EC  81 mg Oral Daily  . atorvastatin  10 mg Oral Daily  . Chlorhexidine Gluconate Cloth  6 each Topical Q0600  . Chlorhexidine Gluconate Cloth  6 each Topical Q0600  . clopidogrel  75 mg Oral Daily  . gabapentin  200 mg Oral BID  . heparin  5,000 Units Subcutaneous Q8H  . losartan  100 mg Oral QHS  . metoprolol tartrate  25 mg Oral Daily  . sevelamer carbonate  2,400 mg Oral TID WC  . sodium chloride flush  3 mL Intravenous Q12H  . zinc sulfate  220 mg Oral Daily   Continuous Infusions:    LOS: 1 day     Enzo Bi, MD [facility] Pager [phone]  If 7PM-7AM, please contact night-coverage 06/29/2020, 4:42 PM

## 2020-06-29 NOTE — Progress Notes (Signed)
Physical Therapy Treatment Patient Details Name: Johnny [name]. MRN: [id number] DOB: 1951/02/27 Today's Date: 06/29/2020    History of Present Illness Johnny [name] is a 22yoM who [facility] c SOB, nausea after HD session. Pt had a new groin access placed c vasuclar surgery ~3W prior after toruble with his UE fistual. Pt having hypotension in ED. Pt is (+) COVID19. PMH: CHF, ESRD on HD MWF, HTN, CAD, anemia, GAD, DM, PVD s/p Rt BKA s/p AMB c prosthesis x6 months.    PT Comments    Pt agreeable to PT evaluation. Pt still does not have prosthesis here. BP low during session but nursing aware & pt without c/o adverse symptoms. Pt is able to complete stand pivot transfers with RW & CGA & ambulate up to 5 ft forwards & backwards with RW & min assist with step to pattern with LLE. Pt does require seated rest break between each gait trial 2/2 fatigue. Continue to recommend HHPT f/u upon d/c.    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 (doesn't have prosthesis with him in hospital) Restrictions Weight Bearing Restrictions: No    Mobility  Bed Mobility Overal bed mobility: Modified Independent                  Transfers Overall transfer level: Needs assistance Equipment used: Rolling walker (2 wheeled) Transfers: Sit to/from [facility] Sit to Stand: Min guard Stand pivot transfers: Min guard       General transfer comment: cuing throughout for hand placement to push up/reach back for armrests  Ambulation/Gait Ambulation/Gait assistance: Min assist Gait Distance (Feet):  (3 ft forwards & backwards + 5 ft forwards & backwards) Assistive device: Rolling walker (2 wheeled) Gait Pattern/deviations: Step-to pattern Gait velocity: decreased        Stairs             Wheelchair Mobility    Modified Rankin (Stroke Patients Only)       Balance Overall balance assessment: Mild deficits observed, not formally tested   Sitting balance-Leahy Scale: Good     Standing balance support: During functional activity;Bilateral upper extremity supported Standing balance-Leahy Scale: Fair Standing balance comment: UE reliance on RW during standing without RLE prosthesis                            Cognition Arousal/Alertness: Awake/alert Behavior During Therapy: WFL for tasks assessed/performed Overall Cognitive Status: Within Functional Limits for tasks assessed                                        Exercises General Exercises - Lower Extremity Long Arc Quad: AROM;Strengthening;Left;10 reps;Seated    General Comments General comments (skin integrity, edema, etc.): SPO2 >90% on room air, BP sitting 100/47 mmHg (63), standing 94/69 mmhg (77) denies adverse symptoms      Pertinent Vitals/Pain Pain Assessment: No/denies pain (denies pain, only c/o stiff RUE)    Home Living                      Prior Function  PT Goals (current goals can now be found in the care plan section) Acute Rehab PT Goals Patient Stated Goal: maintain strength for DC back to home PT Goal Formulation: With patient Time For Goal Achievement: 07/10/20 Potential to Achieve Goals: Good Progress towards PT goals: Progressing toward goals    Frequency    Min 2X/week      PT Plan Current plan remains appropriate    Co-evaluation              AM-PAC PT "6 Clicks" Mobility   Outcome Measure  Help needed turning from your back to your side while in a flat bed without using bedrails?: None Help needed moving from lying on your back to sitting on the side of a flat bed without using bedrails?: None Help needed moving to and from a bed to a chair (including a wheelchair)?: A Little Help  needed standing up from a chair using your arms (e.g., wheelchair or bedside chair)?: A Little Help needed to walk in hospital room?: A Little Help needed climbing 3-5 steps with a railing? : Total 6 Click Score: 18    End of Session Equipment Utilized During Treatment: Gait belt Activity Tolerance: Patient tolerated treatment well Patient left: in chair;with call bell/phone within reach;with chair alarm set Nurse Communication: Mobility status PT Visit Diagnosis: Difficulty in walking, not elsewhere classified (R26.2);Unsteadiness on feet (R26.81);Other abnormalities of gait and mobility (R26.89);Muscle weakness (generalized) (M62.81)     Time: 1030-1057 PT Time Calculation (min) (ACUTE ONLY): 27 min  Charges:  $Therapeutic Activity: 23-37 mins                     Johnny [name], PT, DPT 06/29/20, 11:20 AM    Waunita Schooner 06/29/2020, 11:18 AM

## 2020-06-29 NOTE — Progress Notes (Signed)
[facility]  ROUNDING NOTE   Subjective:   [name] was admitted to [facility] on 06/24/2020 for SOB (shortness of breath) [R06.02] ESRD (end stage renal disease) ([place]) [N18.6] Hypotension [I95.9] Hypotension, unspecified hypotension type [I95.9] COVID-19 [U07.1] Dyspnea [R06.00]  Patient was diagnosed with COVID-19+ (06/24/2020)  Patient seen while eating breakfast at the side of bed Says he is feeling well today Denies chest pain and shortness of breath Able to eat meals Feels he is able to discharge    Objective:  Vital signs in last 24 hours:  Temp:  [97.6 F (36.4 C)-99.7 F (37.6 C)] 97.9 F (36.6 C) (02/21 1231) Pulse Rate:  [88-98] 95 (02/21 1231) Resp:  [18-19] 19 (02/21 1231) BP: (81-145)/(33-116) 134/51 (02/21 1231) SpO2:  [95 %-100 %] 98 % (02/21 1231)  Weight change:  Filed Weights   06/26/20 0505 06/27/20 0421 06/28/20 0400  Weight: 76.9 kg 78.2 kg 75.3 kg    Intake/Output: I/O last 3 completed shifts: In: 150 [IV Piggyback:150] Out: 1500 [Other:1500]   Intake/Output this shift:  Total I/O In: 360 [P.O.:360] Out: -   Physical Exam: General: NAD,   Head: Normocephalic, atraumatic. Moist oral mucosal membranes  Eyes: Anicteric,  Lungs:  Clear to auscultation  Heart: Regular rate and rhythm  Abdomen:  Soft, nontender,   Extremities: no peripheral edema.rt bKA  Neurologic: Nonfocal,alert, oriented  Access: Left femoral permcath    Basic Metabolic Panel: Recent Labs  Lab 06/24/20 2320 06/26/20 0732 06/27/20 0544 06/28/20 0623 06/29/20 0545  NA 138 140 140 134* 135  K 3.9 4.4 4.3 4.2 4.5  CL 102 103 102 97* 97*  CO2 '25 23 23 23 24  '$ GLUCOSE 95 77 81 81 72  BUN 14 23 31* 22 38*  CREATININE 6.54* 9.33* 10.64* 7.91* 10.03*  CALCIUM 8.8* 9.1 9.0 8.8* 8.8*  MG  --   --   --  1.5* 1.8    Liver Function Tests: Recent Labs  Lab 06/24/20 2320  AST 13*  ALT 7  ALKPHOS 51  BILITOT 0.4  PROT 6.7  ALBUMIN 3.4*    No results for input(s): LIPASE, AMYLASE in the last 168 hours. No results for input(s): AMMONIA in the last 168 hours.  CBC: Recent Labs  Lab 06/24/20 2229 06/26/20 0732 06/27/20 0544 06/27/20 1614 06/28/20 0623 06/29/20 0545  WBC 9.3 6.8 7.3 9.8 7.4 7.9  NEUTROABS 6.3  --   --   --   --   --   HGB 11.4* 10.7* 10.2* 11.7* 10.9* 9.9*  HCT 36.7* 33.5* 32.7* 37.8* 33.8* 30.5*  MCV 101.9* 100.9* 100.0 102.4* 97.7 97.1  PLT 193 182 174 192 162 168    Cardiac Enzymes: No results for input(s): CKTOTAL, CKMB, CKMBINDEX, TROPONINI in the last 168 hours.  BNP: Invalid input(s): POCBNP  CBG: No results for input(s): GLUCAP in the last 168 hours.  Microbiology: Results for orders placed or performed during the hospital encounter of 06/24/20  Resp Panel by RT-PCR (Flu A&B, Covid) Nasopharyngeal Swab     Status: Abnormal   Collection Time: 06/24/20 10:28 PM   Specimen: Nasopharyngeal Swab; Nasopharyngeal(NP) swabs in vial transport medium  Result Value Ref Range Status   SARS Coronavirus 2 by RT PCR POSITIVE (A) NEGATIVE Final    Comment: RESULT CALLED TO, READ BACK BY AND VERIFIED WITH: KANDELL SHEETS '@0001'$  ON 06/25/20 SKL (NOTE) SARS-CoV-2 target nucleic acids are DETECTED.  The SARS-CoV-2 RNA is generally detectable in upper respiratory specimens during  the acute phase of infection. Positive results are indicative of the presence of the identified virus, but do not rule out bacterial infection or co-infection with other pathogens not detected by the test. Clinical correlation with patient history and other diagnostic information is necessary to determine patient infection status. The expected result is Negative.  Fact Sheet for Patients: EntrepreneurPulse.com.au  Fact Sheet for Healthcare Providers: IncredibleEmployment.be  This test is not yet approved or cleared by the [place] FDA and  has been authorized for detection and/or  diagnosis of SARS-CoV-2 by FDA under an Emergency Use Authorization (EUA).  This EUA will remain in effect (meaning this test can  be used) for the duration of  the COVID-19 declaration under Section 564(b)(1) of the Act, 21 U.S.C. section 360bbb-3(b)(1), unless the authorization is terminated or revoked sooner.     Influenza A by PCR NEGATIVE NEGATIVE Final   Influenza B by PCR NEGATIVE NEGATIVE Final    Comment: (NOTE) The Xpert Xpress SARS-CoV-2/FLU/RSV plus assay is intended as an aid in the diagnosis of influenza from Nasopharyngeal swab specimens and should not be used as a sole basis for treatment. Nasal washings and aspirates are unacceptable for Xpert Xpress SARS-CoV-2/FLU/RSV testing.  Fact Sheet for Patients: EntrepreneurPulse.com.au  Fact Sheet for Healthcare Providers: IncredibleEmployment.be  This test is not yet approved or cleared by the [place] FDA and has been authorized for detection and/or diagnosis of SARS-CoV-2 by FDA under an Emergency Use Authorization (EUA). This EUA will remain in effect (meaning this test can be used) for the duration of the COVID-19 declaration under Section 564(b)(1) of the Act, 21 U.S.C. section 360bbb-3(b)(1), unless the authorization is terminated or revoked.  Performed at [facility] [address] [place], [place] [address]   CULTURE, BLOOD (ROUTINE X 2) w Reflex to ID Panel     Status: None (Preliminary result)   Collection Time: 06/25/20  5:03 AM   Specimen: BLOOD  Result Value Ref Range Status   Specimen Description BLOOD RIGHT FA  Final   Special Requests   Final    BOTTLES DRAWN AEROBIC AND ANAEROBIC Blood Culture adequate volume   Culture   Final    NO GROWTH 4 DAYS Performed at [facility], [address] [place], [place] [address]    Report Status PENDING  Incomplete  CULTURE, BLOOD (ROUTINE X 2) w Reflex to ID Panel     Status: None (Preliminary  result)   Collection Time: 06/25/20  5:03 AM   Specimen: BLOOD  Result Value Ref Range Status   Specimen Description BLOOD LEFT AC  Final   Special Requests   Final    BOTTLES DRAWN AEROBIC AND ANAEROBIC Blood Culture results may not be optimal due to an excessive volume of blood received in culture bottles   Culture   Final    NO GROWTH 4 DAYS Performed at [facility], [address] [place], [place] [address]    Report Status PENDING  Incomplete    Coagulation Studies: No results for input(s): LABPROT, INR in the last 72 hours.  Urinalysis: No results for input(s): COLORURINE, LABSPEC, PHURINE, GLUCOSEU, HGBUR, BILIRUBINUR, KETONESUR, PROTEINUR, UROBILINOGEN, NITRITE, LEUKOCYTESUR in the last 72 hours.  Invalid input(s): APPERANCEUR    Imaging: No results found.   Medications:    . albuterol  2 puff Inhalation Q6H  . allopurinol  100 mg Oral Daily  . vitamin C  500 mg Oral Daily  . aspirin EC  81 mg  Oral Daily  . atorvastatin  10 mg Oral Daily  . Chlorhexidine Gluconate Cloth  6 each Topical Q0600  . Chlorhexidine Gluconate Cloth  6 each Topical Q0600  . clopidogrel  75 mg Oral Daily  . gabapentin  200 mg Oral BID  . heparin  5,000 Units Subcutaneous Q8H  . losartan  100 mg Oral QHS  . metoprolol tartrate  25 mg Oral Daily  . sevelamer carbonate  2,400 mg Oral TID WC  . sodium chloride flush  3 mL Intravenous Q12H  . zinc sulfate  220 mg Oral Daily   acetaminophen, chlorpheniramine-HYDROcodone, clonazePAM, guaiFENesin-dextromethorphan, ondansetron **OR** ondansetron (ZOFRAN) IV  Assessment/ Plan:  [name] is a 70 y.o. black male with end stage renal disease on hemodialysis, peripheral vascular disease status post right AKA, coronary artery disease, hypertension, hyperlipidemia, congestive heart failure who is admitted to [facility] on 06/24/2020 for SOB (shortness of breath) [R06.02] ESRD (end stage renal disease) ([place]) [N18.6] Hypotension  [I95.9] Hypotension, unspecified hypotension type [I95.9] COVID-19 [U07.1] Dyspnea [R06.00] Found to have COVID-19+ with cough and shortness of breath. Patient is vaccinated.   [facility] MWF [place] left femoral permcath 79kg  1. End Stage renal disease: MWF.  -patient informed of hospital dialysis covid schedule -he is agreeable to TTS dialysis schedule in hospital -Transportation has been arranged for Thursday to transport patient to [place].  -Will schedule patient for dialysis tomorrow.  -Once complete, clear to discharge from our stance  2. HTN:  Blood pressure control is variable.  Currently getting 25 mg Lopressor daily Home regimen includes hydralazine 25 mg twice a day, losartan 100 mg daily -soft BP's during the night  3. Anemia of chronic kidney disease:  Lab Results  Component Value Date   HGB 9.9 (L) 06/29/2020    - EPO as outpatient.  -Will monitor this level  4. Secondary Hyperparathyroidism:  Lab Results  Component Value Date   PTH 553 (H) 09/30/2015   CALCIUM 8.8 (L) 06/29/2020   CAION 1.08 (L) 05/21/2020   PHOS 5.0 (H) 10/30/2018    - sevelamer with meals TID  5. Covid-19 positive - Positive result-06/24/20 IV remdesivir as per hospitalist team 4 doses starting 2/18   LOS: 1 [place]   Patient was seen and evaluated with [place] with her note with edits as above  2/21/20222:11 PM

## 2020-06-29 NOTE — TOC Progression Note (Signed)
Transition of Care (TOC) - Progression Note    Patient Details  Name: Johnny [name]. MRN: [id number] Date of Birth: 1950/07/28  Transition of Care [facility]) CM/SW [place], RN Phone Number: 06/29/2020, 1:04 PM  Clinical Narrative:  In an attempt to discharge patient, transportation is needed for temporary [facility] [facility] site due to COVID + Dx. Called [facility], they do no transport COVID + patients and referred me to Emergency Management, spoke with Jacqlyn Larsen [phone] and they only had  Slot for Thursday, however the SW at the [facility] site will need to send referral requesting transportation. Tried calling the [facility] site they did not have patient scheduled, call [facility] and left voice message to call. Elvera Bicker, dialysis coordinator for hospital states that patient will be going temporarily to [facility] and they will not return my call, she spoke with the SW and they will send referral for Transportation, however will not until patient is discharged. Therefore patient will remain her and get Dialyzed tomorrow, Tues and go to the [facility] clinic Thursday and will not be dialyzed Saturday, and return to home dialysis center on Monday. Estill Bamberg informed me that the patient has a Hoveround chair and that Emergency Management needed to be aware to assure that the vehicle can accommodate the chair, I did call back left message for Jacqlyn Larsen to return call.      Barriers to Discharge: Continued Medical Work up,Transportation  Expected Discharge Plan and Services           Expected Discharge Date: 06/28/20                                     Social Determinants of Health (SDOH) Interventions    Readmission Risk Interventions No flowsheet data found.

## 2020-06-30 ENCOUNTER — Ambulatory Visit: Payer: Medicare Other

## 2020-06-30 DIAGNOSIS — R0600 Dyspnea, unspecified: Secondary | ICD-10-CM | POA: Diagnosis not present

## 2020-06-30 DIAGNOSIS — U071 COVID-19: Secondary | ICD-10-CM | POA: Diagnosis not present

## 2020-06-30 LAB — CULTURE, BLOOD (ROUTINE X 2)
Culture: NO GROWTH
Culture: NO GROWTH
Special Requests: ADEQUATE

## 2020-06-30 LAB — CBC
HCT: 29.1 % — ABNORMAL LOW (ref 39.0–52.0)
Hemoglobin: 9.7 g/dL — ABNORMAL LOW (ref 13.0–17.0)
MCH: 31.8 pg (ref 26.0–34.0)
MCHC: 33.3 g/dL (ref 30.0–36.0)
MCV: 95.4 fL (ref 80.0–100.0)
Platelets: 170 10*3/uL (ref 150–400)
RBC: 3.05 MIL/uL — ABNORMAL LOW (ref 4.22–5.81)
RDW: 14.1 % (ref 11.5–15.5)
WBC: 8.3 10*3/uL (ref 4.0–10.5)
nRBC: 0 % (ref 0.0–0.2)

## 2020-06-30 LAB — BASIC METABOLIC PANEL
Anion gap: 14 (ref 5–15)
BUN: 47 mg/dL — ABNORMAL HIGH (ref 8–23)
CO2: 23 mmol/L (ref 22–32)
Calcium: 8.5 mg/dL — ABNORMAL LOW (ref 8.9–10.3)
Chloride: 98 mmol/L (ref 98–111)
Creatinine, Ser: 11.51 mg/dL — ABNORMAL HIGH (ref 0.61–1.24)
GFR, Estimated: 4 mL/min — ABNORMAL LOW (ref >60)
Glucose, Bld: 77 mg/dL (ref 70–99)
Potassium: 4.4 mmol/L (ref 3.5–5.1)
Sodium: 135 mmol/L (ref 135–145)

## 2020-06-30 LAB — MAGNESIUM: Magnesium: 1.9 mg/dL (ref 1.7–2.4)

## 2020-06-30 MED ORDER — MIDODRINE HCL 5 MG PO TABS
10.0000 mg | ORAL_TABLET | Freq: Once | ORAL | Status: AC
  Administered 2020-06-30: 10 mg via ORAL
  Filled 2020-06-30: qty 2

## 2020-06-30 NOTE — Progress Notes (Signed)
[facility]  ROUNDING NOTE   Subjective:   [name] was admitted to [facility] on 06/24/2020 for SOB (shortness of breath) [R06.02] ESRD (end stage renal disease) ([place]) [N18.6] Hypotension [I95.9] Hypotension, unspecified hypotension type [I95.9] COVID-19 [U07.1] Dyspnea [R06.00]  Patient was diagnosed with COVID-19+ (06/24/2020)  Patient seen sitting at the side of the bed States he feels well today Alert and oriented Denies shortness of breath  Patient seen later during dialysis   HEMODIALYSIS FLOWSHEET:  Blood Flow Rate (mL/min): 0 mL/min Arterial Pressure (mmHg): 0 mmHg Venous Pressure (mmHg): 400 mmHg Transmembrane Pressure (mmHg): 10 mmHg Ultrafiltration Rate (mL/min): 0 mL/min Dialysate Flow Rate (mL/min): 500 ml/min Conductivity: Machine : 13.9 Conductivity: Machine : 13.9 Dialysis Fluid Bolus: Normal Saline Bolus Amount (mL): 250 mL     Objective:  Vital signs in last 24 hours:  Temp:  [98 F (36.7 C)-98.9 F (37.2 C)] 98 F (36.7 C) (02/22 1023) Pulse Rate:  [83-101] 85 (02/22 1306) Resp:  [14-20] 20 (02/22 1306) BP: (93-142)/(36-80) 123/77 (02/22 1306) SpO2:  [96 %-100 %] 100 % (02/22 1230) Weight:  [74.8 kg] 74.8 kg (02/22 0428)  Weight change:  Filed Weights   06/27/20 0421 06/28/20 0400 06/30/20 0428  Weight: 78.2 kg 75.3 kg 74.8 kg    Intake/Output: I/O last 3 completed shifts: In: 480 [P.O.:480] Out: -    Intake/Output this shift:  Total I/O In: -  Out: 754 [Other:754]  Physical Exam: General: NAD,   Head: Normocephalic, atraumatic. Moist oral mucosal membranes  Eyes: Anicteric,  Lungs:  Clear to auscultation  Heart: Regular rate and rhythm  Abdomen:  Soft, nontender,   Extremities: no peripheral edema.rt bKA  Neurologic: Nonfocal,alert, oriented  Access: Left femoral permcath    Basic Metabolic Panel: Recent Labs  Lab 06/26/20 0732 06/27/20 0544 06/28/20 0623 06/29/20 0545 06/30/20 0505  NA 140  140 134* 135 135  K 4.4 4.3 4.2 4.5 4.4  CL 103 102 97* 97* 98  CO2 '23 23 23 24 23  '$ GLUCOSE 77 81 81 72 77  BUN 23 31* 22 38* 47*  CREATININE 9.33* 10.64* 7.91* 10.03* 11.51*  CALCIUM 9.1 9.0 8.8* 8.8* 8.5*  MG  --   --  1.5* 1.8 1.9    Liver Function Tests: Recent Labs  Lab 06/24/20 2320  AST 13*  ALT 7  ALKPHOS 51  BILITOT 0.4  PROT 6.7  ALBUMIN 3.4*   No results for input(s): LIPASE, AMYLASE in the last 168 hours. No results for input(s): AMMONIA in the last 168 hours.  CBC: Recent Labs  Lab 06/24/20 2229 06/26/20 0732 06/27/20 0544 06/27/20 1614 06/28/20 0623 06/29/20 0545 06/30/20 0505  WBC 9.3   < > 7.3 9.8 7.4 7.9 8.3  NEUTROABS 6.3  --   --   --   --   --   --   HGB 11.4*   < > 10.2* 11.7* 10.9* 9.9* 9.7*  HCT 36.7*   < > 32.7* 37.8* 33.8* 30.5* 29.1*  MCV 101.9*   < > 100.0 102.4* 97.7 97.1 95.4  PLT 193   < > 174 192 162 168 170   < > = values in this interval not displayed.    Cardiac Enzymes: No results for input(s): CKTOTAL, CKMB, CKMBINDEX, TROPONINI in the last 168 hours.  BNP: Invalid input(s): POCBNP  CBG: No results for input(s): GLUCAP in the last 168 hours.  Microbiology: Results for orders placed or performed during the hospital encounter of  06/24/20  Resp Panel by RT-PCR (Flu A&B, Covid) Nasopharyngeal Swab     Status: Abnormal   Collection Time: 06/24/20 10:28 PM   Specimen: Nasopharyngeal Swab; Nasopharyngeal(NP) swabs in vial transport medium  Result Value Ref Range Status   SARS Coronavirus 2 by RT PCR POSITIVE (A) NEGATIVE Final    Comment: RESULT CALLED TO, READ BACK BY AND VERIFIED WITH: KANDELL SHEETS '@0001'$  ON 06/25/20 SKL (NOTE) SARS-CoV-2 target nucleic acids are DETECTED.  The SARS-CoV-2 RNA is generally detectable in upper respiratory specimens during the acute phase of infection. Positive results are indicative of the presence of the identified virus, but do not rule out bacterial infection or co-infection with  other pathogens not detected by the test. Clinical correlation with patient history and other diagnostic information is necessary to determine patient infection status. The expected result is Negative.  Fact Sheet for Patients: EntrepreneurPulse.com.au  Fact Sheet for Healthcare Providers: IncredibleEmployment.be  This test is not yet approved or cleared by the [place] FDA and  has been authorized for detection and/or diagnosis of SARS-CoV-2 by FDA under an Emergency Use Authorization (EUA).  This EUA will remain in effect (meaning this test can  be used) for the duration of  the COVID-19 declaration under Section 564(b)(1) of the Act, 21 U.S.C. section 360bbb-3(b)(1), unless the authorization is terminated or revoked sooner.     Influenza A by PCR NEGATIVE NEGATIVE Final   Influenza B by PCR NEGATIVE NEGATIVE Final    Comment: (NOTE) The Xpert Xpress SARS-CoV-2/FLU/RSV plus assay is intended as an aid in the diagnosis of influenza from Nasopharyngeal swab specimens and should not be used as a sole basis for treatment. Nasal washings and aspirates are unacceptable for Xpert Xpress SARS-CoV-2/FLU/RSV testing.  Fact Sheet for Patients: EntrepreneurPulse.com.au  Fact Sheet for Healthcare Providers: IncredibleEmployment.be  This test is not yet approved or cleared by the [place] FDA and has been authorized for detection and/or diagnosis of SARS-CoV-2 by FDA under an Emergency Use Authorization (EUA). This EUA will remain in effect (meaning this test can be used) for the duration of the COVID-19 declaration under Section 564(b)(1) of the Act, 21 U.S.C. section 360bbb-3(b)(1), unless the authorization is terminated or revoked.  Performed at [facility], [address] [place], [place] [address]   CULTURE, BLOOD (ROUTINE X 2) w Reflex to ID Panel     Status: None   Collection Time:  06/25/20  5:03 AM   Specimen: BLOOD  Result Value Ref Range Status   Specimen Description BLOOD RIGHT FA  Final   Special Requests   Final    BOTTLES DRAWN AEROBIC AND ANAEROBIC Blood Culture adequate volume   Culture   Final    NO GROWTH 5 DAYS Performed at [facility], [address] [place], [place] [address]    Report Status 06/30/2020 FINAL  Final  CULTURE, BLOOD (ROUTINE X 2) w Reflex to ID Panel     Status: None   Collection Time: 06/25/20  5:03 AM   Specimen: BLOOD  Result Value Ref Range Status   Specimen Description BLOOD LEFT AC  Final   Special Requests   Final    BOTTLES DRAWN AEROBIC AND ANAEROBIC Blood Culture results may not be optimal due to an excessive volume of blood received in culture bottles   Culture   Final    NO GROWTH 5 DAYS Performed at [facility], [address] [place], [place] [address]    Report Status 06/30/2020  FINAL  Final    Coagulation Studies: No results for input(s): LABPROT, INR in the last 72 hours.  Urinalysis: No results for input(s): COLORURINE, LABSPEC, PHURINE, GLUCOSEU, HGBUR, BILIRUBINUR, KETONESUR, PROTEINUR, UROBILINOGEN, NITRITE, LEUKOCYTESUR in the last 72 hours.  Invalid input(s): APPERANCEUR    Imaging: No results found.   Medications:    . albuterol  2 puff Inhalation Q6H  . allopurinol  100 mg Oral Daily  . vitamin C  500 mg Oral Daily  . aspirin EC  81 mg Oral Daily  . atorvastatin  10 mg Oral Daily  . Chlorhexidine Gluconate Cloth  6 each Topical Q0600  . Chlorhexidine Gluconate Cloth  6 each Topical Q0600  . clopidogrel  75 mg Oral Daily  . gabapentin  200 mg Oral BID  . heparin  5,000 Units Subcutaneous Q8H  . metoprolol tartrate  25 mg Oral Daily  . sevelamer carbonate  2,400 mg Oral TID WC  . sodium chloride flush  3 mL Intravenous Q12H  . zinc sulfate  220 mg Oral Daily   acetaminophen, chlorpheniramine-HYDROcodone, clonazePAM, guaiFENesin-dextromethorphan, ondansetron  **OR** ondansetron (ZOFRAN) IV  Assessment/ Plan:  [name] is a 70 y.o. black male with end stage renal disease on hemodialysis, peripheral vascular disease status post right AKA, coronary artery disease, hypertension, hyperlipidemia, congestive heart failure who is admitted to [facility] on 06/24/2020 for SOB (shortness of breath) [R06.02] ESRD (end stage renal disease) ([place]) [N18.6] Hypotension [I95.9] Hypotension, unspecified hypotension type [I95.9] COVID-19 [U07.1] Dyspnea [R06.00] Found to have COVID-19+ with cough and shortness of breath. Patient is vaccinated.   [facility] MWF [place] left femoral permcath 79kg  1. End Stage renal disease: MWF.  -dialysis received today bedside due to COVID status -Transportation has been arranged for Thursday to transport patient to [place].  -patient cleared to discharge once dialysis is complete  2. HTN:  Blood pressure control is variable.  Currently getting 25 mg Lopressor daily Home regimen includes hydralazine 25 mg twice a day, losartan 100 mg daily -BP's stable during dialysis  3. Anemia of chronic kidney disease:  Lab Results  Component Value Date   HGB 9.7 (L) 06/30/2020    - EPO as outpatient.  -Will monitor this level  4. Secondary Hyperparathyroidism:  Lab Results  Component Value Date   PTH 553 (H) 09/30/2015   CALCIUM 8.5 (L) 06/30/2020   CAION 1.08 (L) 05/21/2020   PHOS 5.0 (H) 10/30/2018    - sevelamer with meals TID  5. Covid-19 positive - Positive result-06/24/20 IV remdesivir as per hospitalist team 4 doses starting 2/18   LOS: 2 [place]   Patient was seen and evaluated with [place] with her note with edits as above  2/22/20222:47 PM

## 2020-06-30 NOTE — TOC Transition Note (Signed)
Transition of Care [facility]) - CM/SW Discharge Note   Patient Details  Name: Demarrius Overall. MRN: [id number] Date of Birth: [birth year]  [facility]) CM/SW Contact:  Kerin Salen, RN Phone Number: 06/30/2020, 1:52 PM   Clinical Narrative:   Patient to be discharged today, scheduled for [facility] in [place] on Thursday, will resume dialysis at the [facility] site on Monday, 07/06/20. Per Elvera Bicker Dialysis arrangements completed.    Final next level of care: Home/Self Care Barriers to Discharge: Barriers Resolved   Patient Goals and CMS Choice Patient states their goals for this hospitalization and ongoing recovery are:: To return home   Choice offered to / list presented to : NA  Discharge Placement                Patient to be transferred to facility by: Non-emergent EMS Name of family member notified: [name] Patient and family notified of of transfer: 06/30/20  Discharge Plan and Services                DME Arranged: N/A DME Agency: NA       HH Arranged: NA HH Agency: NA        Social Determinants of Health (SDOH) Interventions     Readmission Risk Interventions No flowsheet data found.

## 2020-06-30 NOTE — Progress Notes (Signed)
Patient provided with patient education as well as information about transportation from house to dialysis weekly. I reviewed this info with patient I received from [employer].

## 2020-06-30 NOTE — Discharge Summary (Signed)
Physician Discharge Summary   Johnny [name].  male DOB: 29-Oct-1950  WX:[id number]  PCP: [name], MD  Admit date: 06/24/2020 Discharge date: 06/30/2020  (discharge was delayed by several days due to inability to schedule transport to outpatient dialysis center designated for COVID pts).  Admitted From: home Disposition:  home Home Health: Yes CODE STATUS: Full code  Discharge Instructions    Discharge instructions   Complete by: As directed    Please hold your blood pressure medication Hydralazine and Losartan until followup with your outpatient doctor because your blood pressure is intermittently low.   Dr. Enzo Bi - -      68 Day Unplanned Readmission Risk Score   Flowsheet Row ED to Hosp-Admission (Current) from 06/24/2020 in [facility]  30 Day Unplanned Readmission Risk Score (%) 20.55 Filed at 06/30/2020 0801     This score is the patient's risk of an unplanned readmission within 30 days of being discharged (0 -100%). The score is based on dignosis, age, lab data, medications, orders, and past utilization.   Low:  0-14.9   Medium: 15-21.9   High: 22-29.9   Extreme: 30 and above         Hospital Course:  For full details, please see H&P, progress notes, consult notes and ancillary notes.  Briefly,  Johnny Pachecois a 70 y.o.malewith medical history significant forESRD on HD MWF with recent placement on 06/09/2020 ofleftfemoral vein tunneled dialysis catheter due to problems with AV fistula, as well as history of PAD status post right AKA, HTN, CAD, diastolic heart failure, who presents for evaluation of shortness of breath.   Patient was in his usual state of health until he got home from dialysis on the day of arrival when he developed shortness of breath associated with a non productive cough. He denied chest pain, fever or chills. Denied vomiting, diarrhea or abdominal pain.  On arrival in the ED he was noted to be  hypotensive at 84/56, with pulse of 91, afebrile, mild tachypnea of 20 with O2 sat 98% on room air. Blood work was for the most part unremarkable with WBC 9300, lactic acid 1.2, BNP 32, troponin26>25.Procalcitonin was elevated at 3.38 but has been chronically elevated at 2-4 for the past few years. Covid positive, flu negative.  Acute dyspnea, resolved likely secondary to [place] showed vascular congestion w/o overt edema. Not requiring supplemental oxygen.  COVID-19 virus infection:  completed IV remdesivir. Not currently hypoxic, so pt was not started on steroid.  Hypotension, POA Maybe due to dialysis fluid removal. --cont home metop --Hold home losartan for now --hold home hydralazine for now  ESRD: on HD MWF. W/ left femoral vein tunneled dialysis catheter, placed on 06/09/20.  --due to COVID infection, needs to change dialysis to TTS at [facility] dialysis center until 2/26 when pt is out of isolation.  Pt received dialysis on Tuesday 2/22 prior to discharge, and transportation has been arranged to take him to dialysis on Thursday.  Nephro is ok with pt skipping Sat dialysis and will resume on his regular dialysis schedule on Monday 2/28.  ACD: likely secondary to ESRD. No transfusion needed currently   Chronic diastolic CHF:  CXR shows vascular congestion but clinically appears euvolemic.   PAD: w/ hx of right AKA  Continue on aspirin, statin, plavix  Anxiety on chronic benzo --mood stable --cont home Klonopin as 0.5 mg BID PRN   Discharge Diagnoses:  Active Problems:   ESRD on  hemodialysis (HCC)   Chronic diastolic CHF (congestive heart failure) (HCC)   Above knee amputation of right lower extremity ([place])   COVID-19 virus infection   Hypotension of hemodialysis   Hypotension   Dyspnea    Discharge Instructions:  Allergies as of 06/30/2020      Reactions   Shellfish Allergy Anaphylaxis      Medication List    TAKE these medications   allopurinol 100  MG tablet Commonly known as: ZYLOPRIM Take 100 mg by mouth daily.   aspirin EC 81 MG tablet Take 1 tablet (81 mg total) by mouth daily.   atorvastatin 10 MG tablet Commonly known as: LIPITOR Take 1 tablet by mouth daily.   clonazePAM 0.5 MG tablet Commonly known as: KLONOPIN Take 1 tablet (0.5 mg total) by mouth as needed. What changed: when to take this   clopidogrel 75 MG tablet Commonly known as: Plavix Take 1 tablet (75 mg total) by mouth daily.   folic acid-vitamin b complex-vitamin c-selenium-zinc 3 MG Tabs tablet Take 1 tablet by mouth daily.   gabapentin 100 MG capsule Commonly known as: NEURONTIN Take 2 capsules (200 mg total) by mouth 2 (two) times daily. After dialysis session   hydrALAZINE 25 MG tablet Commonly known as: APRESOLINE Hold until followup with outpatient doctor due to intermittently low blood pressure. What changed:   how much to take  how to take this  when to take this  additional instructions   losartan 100 MG tablet Commonly known as: COZAAR Hold until followup with outpatient doctor due to intermittently low blood pressure. What changed:   how much to take  how to take this  when to take this  additional instructions   metoprolol tartrate 25 MG tablet Commonly known as: LOPRESSOR Take 25 mg by mouth every evening.   oxyCODONE 5 MG immediate release tablet Commonly known as: Roxicodone Take 1-2 tablets (5-10 mg total) by mouth every 6 (six) hours as needed for severe pain (No more than 6 tabs daily).   sevelamer carbonate 800 MG tablet Commonly known as: RENVELA Take 2,400 mg by mouth 3 (three) times daily with meals.        Follow-up Information    Burns, Harriett P, MD. Schedule an appointment as soon as possible for a visit in 1 week(s).   Specialty: Internal Medicine Contact information: [place] [address] [phone]               Allergies  Allergen Reactions  .  Shellfish Allergy Anaphylaxis     The results of significant diagnostics from this hospitalization (including imaging, microbiology, ancillary and laboratory) are listed below for reference.   Consultations:   Procedures/Studies: DG Chest 1 View  Result Date: 06/24/2020 CLINICAL DATA:  Short of breath after dialysis EXAM: CHEST  1 VIEW COMPARISON:  06/08/2020 FINDINGS: Two frontal views of the chest demonstrate unremarkable cardiac silhouette. Radiopaque catheter is overlying the junction of the IVC and right atrium likely reflect an inferior dialysis catheter. There is mild central vascular congestion without airspace disease, effusion, or pneumothorax. Numerous vascular stents are again seen, stable. IMPRESSION: 1. Central vascular congestion without overt edema. 2. Superior extent of a dialysis catheter via IVC approach, tip overlying atriocaval junction. Electronically Signed   By: Randa Ngo M.D.   On: 06/24/2020 22:26   DG Chest 2 View  Result Date: 06/08/2020 CLINICAL DATA:  Weakness EXAM: CHEST - 2 VIEW COMPARISON:  10/29/2018 chest radiograph. FINDINGS: Multiple  vascular stents overlying the bilateral axilla and right thoracic inlet. Stable cardiomediastinal silhouette with top-normal heart size. No pneumothorax. No pleural effusion. No pulmonary edema. No acute consolidative airspace disease. IMPRESSION: No active cardiopulmonary disease. Electronically Signed   By: Ilona Sorrel M.D.   On: 06/08/2020 10:24   PERIPHERAL VASCULAR CATHETERIZATION  Result Date: 06/09/2020 See op note     Labs: BNP (last 3 results) Recent Labs    06/24/20 2228  BNP [id number]   Basic Metabolic Panel: Recent Labs  Lab 06/26/20 0732 06/27/20 0544 06/28/20 0623 06/29/20 0545 06/30/20 0505  NA 140 140 134* 135 135  K 4.4 4.3 4.2 4.5 4.4  CL 103 102 97* 97* 98  CO2 '23 23 23 24 23  '$ GLUCOSE 77 81 81 72 77  BUN 23 31* 22 38* 47*  CREATININE 9.33* 10.64* 7.91* 10.03* 11.51*  CALCIUM 9.1 9.0  8.8* 8.8* 8.5*  MG  --   --  1.5* 1.8 1.9   Liver Function Tests: Recent Labs  Lab 06/24/20 2320  AST 13*  ALT 7  ALKPHOS 51  BILITOT 0.4  PROT 6.7  ALBUMIN 3.4*   No results for input(s): LIPASE, AMYLASE in the last 168 hours. No results for input(s): AMMONIA in the last 168 hours. CBC: Recent Labs  Lab 06/24/20 2229 06/26/20 0732 06/27/20 0544 06/27/20 1614 06/28/20 0623 06/29/20 0545 06/30/20 0505  WBC 9.3   < > 7.3 9.8 7.4 7.9 8.3  NEUTROABS 6.3  --   --   --   --   --   --   HGB 11.4*   < > 10.2* 11.7* 10.9* 9.9* 9.7*  HCT 36.7*   < > 32.7* 37.8* 33.8* 30.5* 29.1*  MCV 101.9*   < > 100.0 102.4* 97.7 97.1 95.4  PLT 193   < > 174 192 162 168 170   < > = values in this interval not displayed.   Cardiac Enzymes: No results for input(s): CKTOTAL, CKMB, CKMBINDEX, TROPONINI in the last 168 hours. BNP: Invalid input(s): POCBNP CBG: No results for input(s): GLUCAP in the last 168 hours. D-Dimer No results for input(s): DDIMER in the last 72 hours. Hgb A1c No results for input(s): HGBA1C in the last 72 hours. Lipid Profile No results for input(s): CHOL, HDL, LDLCALC, TRIG, CHOLHDL, LDLDIRECT in the last 72 hours. Thyroid function studies No results for input(s): TSH, T4TOTAL, T3FREE, THYROIDAB in the last 72 hours.  Invalid input(s): FREET3 Anemia work up No results for input(s): VITAMINB12, FOLATE, FERRITIN, TIBC, IRON, RETICCTPCT in the last 72 hours. Urinalysis No results found for: COLORURINE, APPEARANCEUR, [place], [place], GLUCOSEU, [place], [place], [place], PROTEINUR, UROBILINOGEN, NITRITE, LEUKOCYTESUR Sepsis Labs Invalid input(s): PROCALCITONIN,  WBC,  LACTICIDVEN Microbiology Recent Results (from the past 240 hour(s))  Resp Panel by RT-PCR (Flu A&B, Covid) Nasopharyngeal Swab     Status: Abnormal   Collection Time: 06/24/20 10:28 PM   Specimen: Nasopharyngeal Swab; Nasopharyngeal(NP) swabs in vial transport medium  Result Value Ref Range Status    SARS Coronavirus 2 by RT PCR POSITIVE (A) NEGATIVE Final    Comment: RESULT CALLED TO, READ BACK BY AND VERIFIED WITH: KANDELL SHEETS '@0001'$  ON 06/25/20 SKL (NOTE) SARS-CoV-2 target nucleic acids are DETECTED.  The SARS-CoV-2 RNA is generally detectable in upper respiratory specimens during the acute phase of infection. Positive results are indicative of the presence of the identified virus, but do not rule out bacterial infection or co-infection with other pathogens not detected by the test. Clinical correlation  with patient history and other diagnostic information is necessary to determine patient infection status. The expected result is Negative.  Fact Sheet for Patients: EntrepreneurPulse.com.au  Fact Sheet for Healthcare Providers: IncredibleEmployment.be  This test is not yet approved or cleared by the [place] FDA and  has been authorized for detection and/or diagnosis of SARS-CoV-2 by FDA under an Emergency Use Authorization (EUA).  This EUA will remain in effect (meaning this test can  be used) for the duration of  the COVID-19 declaration under Section 564(b)(1) of the Act, 21 U.S.C. section 360bbb-3(b)(1), unless the authorization is terminated or revoked sooner.     Influenza A by PCR NEGATIVE NEGATIVE Final   Influenza B by PCR NEGATIVE NEGATIVE Final    Comment: (NOTE) The Xpert Xpress SARS-CoV-2/FLU/RSV plus assay is intended as an aid in the diagnosis of influenza from Nasopharyngeal swab specimens and should not be used as a sole basis for treatment. Nasal washings and aspirates are unacceptable for Xpert Xpress SARS-CoV-2/FLU/RSV testing.  Fact Sheet for Patients: EntrepreneurPulse.com.au  Fact Sheet for Healthcare Providers: IncredibleEmployment.be  This test is not yet approved or cleared by the [place] FDA and has been authorized for detection and/or diagnosis of  SARS-CoV-2 by FDA under an Emergency Use Authorization (EUA). This EUA will remain in effect (meaning this test can be used) for the duration of the COVID-19 declaration under Section 564(b)(1) of the Act, 21 U.S.C. section 360bbb-3(b)(1), unless the authorization is terminated or revoked.  Performed at [facility], [address] [place], [place] [address]   CULTURE, BLOOD (ROUTINE X 2) w Reflex to ID Panel     Status: None   Collection Time: 06/25/20  5:03 AM   Specimen: BLOOD  Result Value Ref Range Status   Specimen Description BLOOD RIGHT FA  Final   Special Requests   Final    BOTTLES DRAWN AEROBIC AND ANAEROBIC Blood Culture adequate volume   Culture   Final    NO GROWTH 5 DAYS Performed at [facility], [address] [place], [place] [address]    Report Status 06/30/2020 FINAL  Final  CULTURE, BLOOD (ROUTINE X 2) w Reflex to ID Panel     Status: None   Collection Time: 06/25/20  5:03 AM   Specimen: BLOOD  Result Value Ref Range Status   Specimen Description BLOOD LEFT AC  Final   Special Requests   Final    BOTTLES DRAWN AEROBIC AND ANAEROBIC Blood Culture results may not be optimal due to an excessive volume of blood received in culture bottles   Culture   Final    NO GROWTH 5 DAYS Performed at [facility], [address], [place], [place] [address]    Report Status 06/30/2020 FINAL  Final     Total time spend on discharging this patient, including the last patient exam, discussing the hospital stay, instructions for ongoing care as it relates to all pertinent caregivers, as well as preparing the medical discharge records, prescriptions, and/or referrals as applicable, is 35 minutes.    Enzo Bi, MD  [facility] 06/30/2020, 8:50 AM

## 2020-06-30 NOTE — Plan of Care (Signed)
Pt Axox4. Calm and cooperative but anxious. BP 90's/50's. Safety measures in place. Will continue to monitor.  Problem: Education: Goal: Knowledge of General Education information will improve Description: Including pain rating scale, medication(s)/side effects and non-pharmacologic comfort measures Outcome: Progressing   Problem: Health Behavior/Discharge Planning: Goal: Ability to manage health-related needs will improve Outcome: Progressing   Problem: Clinical Measurements: Goal: Ability to maintain clinical measurements within normal limits will improve Outcome: Progressing Goal: Will remain free from infection Outcome: Progressing Goal: Diagnostic test results will improve Outcome: Progressing Goal: Respiratory complications will improve Outcome: Progressing Goal: Cardiovascular complication will be avoided Outcome: Progressing   Problem: Activity: Goal: Risk for activity intolerance will decrease Outcome: Progressing   Problem: Nutrition: Goal: Adequate nutrition will be maintained Outcome: Progressing   Problem: Coping: Goal: Level of anxiety will decrease Outcome: Progressing   Problem: Elimination: Goal: Will not experience complications related to bowel motility Outcome: Progressing Goal: Will not experience complications related to urinary retention Outcome: Progressing   Problem: Pain Managment: Goal: General experience of comfort will improve Outcome: Progressing   Problem: Safety: Goal: Ability to remain free from injury will improve Outcome: Progressing   Problem: Skin Integrity: Goal: Risk for impaired skin integrity will decrease Outcome: Progressing   Problem: Education: Goal: Knowledge of risk factors and measures for prevention of condition will improve Outcome: Progressing   Problem: Coping: Goal: Psychosocial and spiritual needs will be supported Outcome: Progressing   Problem: Respiratory: Goal: Will maintain a patent  airway Outcome: Progressing Goal: Complications related to the disease process, condition or treatment will be avoided or minimized Outcome: Progressing

## 2020-07-02 ENCOUNTER — Ambulatory Visit: Payer: Medicare Other

## 2020-07-21 ENCOUNTER — Ambulatory Visit (INDEPENDENT_AMBULATORY_CARE_PROVIDER_SITE_OTHER): Payer: Medicare Other | Admitting: Nurse Practitioner

## 2020-07-21 ENCOUNTER — Ambulatory Visit (INDEPENDENT_AMBULATORY_CARE_PROVIDER_SITE_OTHER): Payer: Medicare Other

## 2020-07-21 ENCOUNTER — Encounter (INDEPENDENT_AMBULATORY_CARE_PROVIDER_SITE_OTHER): Payer: Self-pay | Admitting: Nurse Practitioner

## 2020-07-21 ENCOUNTER — Other Ambulatory Visit: Payer: Self-pay

## 2020-07-21 VITALS — BP 125/97 | HR 69 | Resp 16

## 2020-07-21 DIAGNOSIS — N186 End stage renal disease: Secondary | ICD-10-CM

## 2020-07-21 DIAGNOSIS — E785 Hyperlipidemia, unspecified: Secondary | ICD-10-CM

## 2020-07-21 DIAGNOSIS — I10 Essential (primary) hypertension: Secondary | ICD-10-CM

## 2020-07-21 DIAGNOSIS — I7025 Atherosclerosis of native arteries of other extremities with ulceration: Secondary | ICD-10-CM

## 2020-07-26 ENCOUNTER — Encounter (INDEPENDENT_AMBULATORY_CARE_PROVIDER_SITE_OTHER): Payer: Self-pay | Admitting: Nurse Practitioner

## 2020-07-26 NOTE — H&P (View-Only) (Signed)
Subjective:    Patient ID: Johnny Bucco., male    DOB: 1950-06-29, 70 y.o.   MRN: [id number] Chief Complaint  Patient presents with  . Follow-up    [facility] 2wk post banding hero graft    The patient presents today for follow-up evaluation for banding of his right upper extremity hero graft after having severe steal symptoms.  His procedure occurred on 05/21/2020.  However since that time the patient has had recent hospitalizations including 1 due to COVID-19.  Since that time the patient's graft has occluded.  There is no thrill or bruit.  This is confirmed today on duplex.  Unfortunately he continues to have numbness in his hand.  He denies any wounds or ulcerations on his lower extremities.  He denies any wounds on his hand.    Review of Systems  Musculoskeletal: Positive for gait problem.  Neurological: Positive for numbness.  All other systems reviewed and are negative.      Objective:   Physical Exam Vitals reviewed.  Cardiovascular:     Rate and Rhythm: Normal rate.     Pulses:          Radial pulses are 1+ on the right side.  Pulmonary:     Effort: Pulmonary effort is normal.  Skin:    General: Skin is warm.  Neurological:     Mental Status: He is alert and oriented to person, place, and time.     Motor: Weakness present.     Gait: Gait abnormal.  Psychiatric:        Mood and Affect: Mood normal.        Behavior: Behavior normal.        Thought Content: Thought content normal.        Judgment: Judgment normal.     BP (!) 125/97 (BP Location: Left Arm)   Pulse 69   Resp 16   Past Medical History:  Diagnosis Date  . Anemia   . Anxiety   . CHF (congestive heart failure) ([place])   . Chronic kidney disease    esrd dialysis m/w/f  . Gout   . Hyperlipidemia   . Hypertension   . Myocardial infarction ([place]) 2010   10 years ago  . 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  . Peripheral vascular disease ([place])     Social  History   Socioeconomic History  . Marital status: Single    Spouse name: Not on file  . Number of children: 0  . Years of education: Not on file  . Highest education level: Not on file  Occupational History  . Occupation: retired    Comment: Medical laboratory scientific officer  Tobacco Use  . Smoking status: Former Smoker    Types: Cigarettes    Quit date: 05/17/2005    Years since quitting: 15.2  . Smokeless tobacco: Never Used  Vaping Use  . Vaping Use: Never used  Substance and Sexual Activity  . Alcohol use: No  . Drug use: No  . Sexual activity: Not Currently  Other Topics Concern  . Not on file  Social History Narrative  . Not on file   Social Determinants of Health   Financial Resource Strain: Not on file  Food Insecurity: Not on file  Transportation Needs: Not on file  Physical Activity: Not on file  Stress: Not on file  Social Connections: Not on file  Intimate Partner Violence: Not on file    Past Surgical History:  Procedure Laterality Date  . A/V FISTULAGRAM Right 09/06/2018   Procedure: A/V FISTULAGRAM;  Surgeon: Algernon Huxley, MD;  Location: [place] [facility];  Service: Cardiovascular;  Laterality: Right;  . A/V SHUNTOGRAM Left 06/21/2017   Procedure: A/V SHUNTOGRAM;  Surgeon: Katha Cabal, MD;  Location: [place] [facility];  Service: Cardiovascular;  Laterality: Left;  . A/V SHUNTOGRAM N/A 10/24/2018   Procedure: A/V SHUNTOGRAM;  Surgeon: Algernon Huxley, MD;  Location: [place] [facility];  Service: Cardiovascular;  Laterality: N/A;  . ABOVE KNEE LEG AMPUTATION Right 2020  . AMPUTATION Right 10/25/2018   Procedure: AMPUTATION ABOVE KNEE;  Surgeon: Algernon Huxley, MD;  Location: [facility];  Service: General;  Laterality: Right;  . APPLICATION OF WOUND VAC Right 04/11/2018   Procedure: APPLICATION OF WOUND VAC;  Surgeon: Algernon Huxley, MD;  Location: [facility];  Service: Vascular;  Laterality: Right;  . AV FISTULA PLACEMENT Left 09/18/2015   Procedure: INSERTION  OF ARTERIOVENOUS (AV) GORE-TEX GRAFT ARM ( BRACH/AXILLARY GRAFT W/ INSTANT STICK GRAFT );  Surgeon: Katha Cabal, MD;  Location: [facility];  Service: Vascular;  Laterality: Left;  . AV FISTULA PLACEMENT Right 07/19/2018   Procedure: INSERTION OF ARTERIOVENOUS (AV) GORE-TEX GRAFT ARM ( BRACHIAL AXILLARY);  Surgeon: Algernon Huxley, MD;  Location: [facility];  Service: Vascular;  Laterality: Right;  . DIALYSIS FISTULA CREATION Right 10/2017   right chest perm cath  . DIALYSIS/PERMA CATHETER INSERTION N/A 06/09/2020   Procedure: DIALYSIS/PERMA CATHETER INSERTION;  Surgeon: Algernon Huxley, MD;  Location: [place] [facility];  Service: Cardiovascular;  Laterality: N/A;  . DIALYSIS/PERMA CATHETER REMOVAL N/A 09/13/2018   Procedure: DIALYSIS/PERMA CATHETER REMOVAL;  Surgeon: Algernon Huxley, MD;  Location: [place] [facility];  Service: Cardiovascular;  Laterality: N/A;  . ESOPHAGOGASTRODUODENOSCOPY N/A 12/19/2017   Procedure: ESOPHAGOGASTRODUODENOSCOPY (EGD);  Surgeon: Lin Landsman, MD;  Location: [facility];  Service: Gastroenterology;  Laterality: N/A;  . LOWER EXTREMITY ANGIOGRAPHY Left 11/16/2017   Procedure: LOWER EXTREMITY ANGIOGRAPHY;  Surgeon: Algernon Huxley, MD;  Location: [place] [facility];  Service: Cardiovascular;  Laterality: Left;  . LOWER EXTREMITY ANGIOGRAPHY Right 01/18/2018   Procedure: LOWER EXTREMITY ANGIOGRAPHY;  Surgeon: Algernon Huxley, MD;  Location: [place] [facility];  Service: Cardiovascular;  Laterality: Right;  . LOWER EXTREMITY ANGIOGRAPHY Left 04/02/2018   Procedure: LOWER EXTREMITY ANGIOGRAPHY;  Surgeon: Algernon Huxley, MD;  Location: [place] [facility];  Service: Cardiovascular;  Laterality: Left;  . LOWER EXTREMITY ANGIOGRAPHY Right 04/09/2018   Procedure: Lower Extremity Angiography with possible intervention;  Surgeon: Algernon Huxley, MD;  Location: [place] [facility];  Service: Cardiovascular;  Laterality: Right;  . LOWER EXTREMITY ANGIOGRAPHY Right 07/23/2018    Procedure: Lower Extremity Angiography;  Surgeon: Algernon Huxley, MD;  Location: [place] [facility];  Service: Cardiovascular;  Laterality: Right;  . LOWER EXTREMITY ANGIOGRAPHY Right 09/13/2018   Procedure: LOWER EXTREMITY ANGIOGRAPHY;  Surgeon: Algernon Huxley, MD;  Location: [place] [facility];  Service: Cardiovascular;  Laterality: Right;  . LOWER EXTREMITY VENOGRAPHY Right 09/13/2018   Procedure: LOWER EXTREMITY VENOGRAPHY;  Surgeon: Algernon Huxley, MD;  Location: [place] [facility];  Service: Cardiovascular;  Laterality: Right;  . PERIPHERAL VASCULAR CATHETERIZATION Left 09/01/2015   Procedure: A/V Shuntogram/Fistulagram;  Surgeon: Katha Cabal, MD;  Location: [place] [facility];  Service: Cardiovascular;  Laterality: Left;  . PERIPHERAL VASCULAR CATHETERIZATION N/A 09/30/2015   Procedure: A/V Shuntogram/Fistulagram with perm  cathether removal;  Surgeon: Algernon Huxley, MD;  Location: [place] [facility];  Service: Cardiovascular;  Laterality: N/A;  . PERIPHERAL VASCULAR CATHETERIZATION Left 09/30/2015   Procedure: A/V Shunt Intervention;  Surgeon: Algernon Huxley, MD;  Location: [place] [facility];  Service: Cardiovascular;  Laterality: Left;  . PERIPHERAL VASCULAR CATHETERIZATION Left 12/03/2015   Procedure: Thrombectomy;  Surgeon: Algernon Huxley, MD;  Location: [place] [facility];  Service: Cardiovascular;  Laterality: Left;  . PERIPHERAL VASCULAR CATHETERIZATION Left 01/28/2016   Procedure: Thrombectomy;  Surgeon: Algernon Huxley, MD;  Location: [place] [facility];  Service: Cardiovascular;  Laterality: Left;  . PERIPHERAL VASCULAR CATHETERIZATION N/A 01/28/2016   Procedure: A/V Shuntogram/Fistulagram;  Surgeon: Algernon Huxley, MD;  Location: [place] [facility];  Service: Cardiovascular;  Laterality: N/A;  . SKIN SPLIT GRAFT Right 05/24/2018   Procedure: SKIN GRAFT SPLIT THICKNESS ( RIGHT CALF);  Surgeon: Algernon Huxley, MD;  Location: [facility];  Service: Vascular;  Laterality: Right;  . UPPER  EXTREMITY ANGIOGRAPHY  10/24/2018   Procedure: Upper Extremity Angiography;  Surgeon: Algernon Huxley, MD;  Location: [place] [facility];  Service: Cardiovascular;;  . UPPER EXTREMITY ANGIOGRAPHY Right 02/14/2019   Procedure: UPPER EXTREMITY ANGIOGRAPHY;  Surgeon: Algernon Huxley, MD;  Location: [place] [facility];  Service: Cardiovascular;  Laterality: Right;  . UPPER EXTREMITY ANGIOGRAPHY Right 04/20/2020   Procedure: UPPER EXTREMITY ANGIOGRAPHY;  Surgeon: Algernon Huxley, MD;  Location: [place] [facility];  Service: Cardiovascular;  Laterality: Right;  . WOUND DEBRIDEMENT Right 04/11/2018   Procedure: DEBRIDEMENT WOUND calf muscle and skin;  Surgeon: Algernon Huxley, MD;  Location: [facility];  Service: Vascular;  Laterality: Right;    Family History  Problem Relation Age of Onset  . Hypertension Other   . Heart disease Other   . Diabetes Mother     Allergies  Allergen Reactions  . Shellfish Allergy Anaphylaxis  . Other     CBC Latest Ref Rng & Units 06/30/2020 06/29/2020 06/28/2020  WBC 4.0 - 10.5 K/uL 8.3 7.9 7.4  Hemoglobin 13.0 - 17.0 g/dL 9.7(L) 9.9(L) 10.9(L)  Hematocrit 39.0 - 52.0 % 29.1(L) 30.5(L) 33.8(L)  Platelets 150 - 400 K/uL 170 168 162      CMP     Component Value Date/Time   NA 135 06/30/2020 0505   NA 139 01/17/2014 0502   K 4.4 06/30/2020 0505   K 4.8 01/17/2014 0502   CL 98 06/30/2020 0505   CL 102 01/17/2014 0502   CO2 23 06/30/2020 0505   CO2 27 01/17/2014 0502   GLUCOSE 77 06/30/2020 0505   GLUCOSE 101 (H) 01/17/2014 0502   BUN 47 (H) 06/30/2020 0505   BUN 62 (H) 01/17/2014 0502   CREATININE 11.51 (H) 06/30/2020 0505   CREATININE 12.30 (H) 01/17/2014 0502   CALCIUM 8.5 (L) 06/30/2020 0505   CALCIUM 8.2 (L) 01/17/2014 0502   PROT 6.7 06/24/2020 2320   PROT 7.9 06/20/2011 2221   ALBUMIN 3.4 (L) 06/24/2020 2320   ALBUMIN 3.1 (L) 01/16/2014 1336   AST 13 (L) 06/24/2020 2320   AST 33 06/20/2011 2221   ALT 7 06/24/2020 2320   ALT 22 06/20/2011 2221    ALKPHOS 51 06/24/2020 2320   ALKPHOS 48 (L) 06/20/2011 2221   BILITOT 0.4 06/24/2020 2320   BILITOT 0.3 06/20/2011 2221   GFRNONAA 4 (L) 06/30/2020 0505   GFRNONAA 4 (L) 01/17/2014 0502   GFRAA 6 (L) 01/21/2020 1056  GFRAA 4 (L) 01/17/2014 0502     No results found.     Assessment & Plan:   1. End stage renal disease ([place]) The patient has had access in his left upper extremity and given the severe steal symptoms in his right left upper extremity it would not be in the patient's best interest to try to reopen his right hero graft.  Would also not be in the patient's best interest to plan any upper extremity access.  Given the severe PAD that the patient has a lower extremity graft placement may result in still symptoms or a subsequent amputation.  Based on this, it is felt that it is in the patient's best interest to remain catheter dependent at this time.  2. Atherosclerosis of native arteries of the extremities with ulceration ([place]) The patient has not had noninvasive studies done in his lower extremities in several months.  We will have the patient return in the next several weeks to perform evaluation on his left lower extremity.  3. Hypertension, unspecified type Continue antihypertensive medications as already ordered, these medications have been reviewed and there are no changes at this time.   4. Hyperlipidemia, unspecified hyperlipidemia type Continue statin as ordered and reviewed, no changes at this time    Current Outpatient Medications on File Prior to Visit  Medication Sig Dispense Refill  . allopurinol (ZYLOPRIM) 100 MG tablet Take 100 mg by mouth daily.    [name] aspirin EC 81 MG tablet Take 1 tablet (81 mg total) by mouth daily. 150 tablet 2  . atorvastatin (LIPITOR) 10 MG tablet Take 1 tablet by mouth daily.    . clonazePAM (KLONOPIN) 0.5 MG tablet Take 1 tablet (0.5 mg total) by mouth as needed. (Patient taking differently: Take 0.5 mg by mouth 2 (two) times daily.)  10 tablet 0  . clopidogrel (PLAVIX) 75 MG tablet Take 1 tablet (75 mg total) by mouth daily. 30 tablet 11  . folic acid-vitamin b complex-vitamin c-selenium-zinc (DIALYVITE) 3 MG TABS tablet Take 1 tablet by mouth daily.     [name] gabapentin (NEURONTIN) 100 MG capsule Take 2 capsules (200 mg total) by mouth 2 (two) times daily. After dialysis session    . hydrALAZINE (APRESOLINE) 25 MG tablet Hold until followup with outpatient doctor due to intermittently low blood pressure.    [name] losartan (COZAAR) 100 MG tablet Hold until followup with outpatient doctor due to intermittently low blood pressure.    . metoprolol tartrate (LOPRESSOR) 25 MG tablet Take 25 mg by mouth every evening.    [name] oxyCODONE (ROXICODONE) 5 MG immediate release tablet Take 1-2 tablets (5-10 mg total) by mouth every 6 (six) hours as needed for severe pain (No more than 6 tabs daily). 20 tablet 0  . sevelamer carbonate (RENVELA) 800 MG tablet Take 2,400 mg by mouth 3 (three) times daily with meals.      No current facility-administered medications on file prior to visit.    There are no Patient Instructions on file for this visit. No follow-ups on file.   Kris Hartmann, NP

## 2020-07-26 NOTE — Progress Notes (Signed)
Subjective:    Patient ID: Johnny [name]., male    DOB: Jun 27, 1950, 70 y.o.   MRN: [id number] Chief Complaint  Patient presents with  . Follow-up    [facility] 2wk post banding hero graft    The patient presents today for follow-up evaluation for banding of his right upper extremity hero graft after having severe steal symptoms.  His procedure occurred on 05/21/2020.  However since that time the patient has had recent hospitalizations including 1 due to COVID-19.  Since that time the patient's graft has occluded.  There is no thrill or bruit.  This is confirmed today on duplex.  Unfortunately he continues to have numbness in his hand.  He denies any wounds or ulcerations on his lower extremities.  He denies any wounds on his hand.    Review of Systems  Musculoskeletal: Positive for gait problem.  Neurological: Positive for numbness.  All other systems reviewed and are negative.      Objective:   Physical Exam Vitals reviewed.  Cardiovascular:     Rate and Rhythm: Normal rate.     Pulses:          Radial pulses are 1+ on the right side.  Pulmonary:     Effort: Pulmonary effort is normal.  Skin:    General: Skin is warm.  Neurological:     Mental Status: He is alert and oriented to person, place, and time.     Motor: Weakness present.     Gait: Gait abnormal.  Psychiatric:        Mood and Affect: Mood normal.        Behavior: Behavior normal.        Thought Content: Thought content normal.        Judgment: Judgment normal.     BP (!) 125/97 (BP Location: Left Arm)   Pulse 69   Resp 16   Past Medical History:  Diagnosis Date  . Anemia   . Anxiety   . CHF (congestive heart failure) ([place])   . Chronic kidney disease    esrd dialysis m/w/f  . Gout   . Hyperlipidemia   . Hypertension   . Myocardial infarction ([place]) 2010   10 years ago  . 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  . Peripheral vascular disease ([place])     Social  History   Socioeconomic History  . Marital status: Single    Spouse name: Not on file  . Number of children: 0  . Years of education: Not on file  . Highest education level: Not on file  Occupational History  . Occupation: retired    Comment: Medical laboratory scientific officer  Tobacco Use  . Smoking status: Former Smoker    Types: Cigarettes    Quit date: 05/17/2005    Years since quitting: 15.2  . Smokeless tobacco: Never Used  Vaping Use  . Vaping Use: Never used  Substance and Sexual Activity  . Alcohol use: No  . Drug use: No  . Sexual activity: Not Currently  Other Topics Concern  . Not on file  Social History Narrative  . Not on file   Social Determinants of Health   Financial Resource Strain: Not on file  Food Insecurity: Not on file  Transportation Needs: Not on file  Physical Activity: Not on file  Stress: Not on file  Social Connections: Not on file  Intimate Partner Violence: Not on file    Past Surgical History:  Procedure Laterality Date  . A/V FISTULAGRAM Right 09/06/2018   Procedure: A/V FISTULAGRAM;  Surgeon: Algernon Huxley, MD;  Location: [facility] [facility];  Service: Cardiovascular;  Laterality: Right;  . A/V SHUNTOGRAM Left 06/21/2017   Procedure: A/V SHUNTOGRAM;  Surgeon: Katha Cabal, MD;  Location: [place] [facility];  Service: Cardiovascular;  Laterality: Left;  . A/V SHUNTOGRAM N/A 10/24/2018   Procedure: A/V SHUNTOGRAM;  Surgeon: Algernon Huxley, MD;  Location: [place] [facility];  Service: Cardiovascular;  Laterality: N/A;  . ABOVE KNEE LEG AMPUTATION Right 2020  . AMPUTATION Right 10/25/2018   Procedure: AMPUTATION ABOVE KNEE;  Surgeon: Algernon Huxley, MD;  Location: [facility];  Service: General;  Laterality: Right;  . APPLICATION OF WOUND VAC Right 04/11/2018   Procedure: APPLICATION OF WOUND VAC;  Surgeon: Algernon Huxley, MD;  Location: [facility];  Service: Vascular;  Laterality: Right;  . AV FISTULA PLACEMENT Left 09/18/2015   Procedure: INSERTION  OF ARTERIOVENOUS (AV) GORE-TEX GRAFT ARM ( BRACH/AXILLARY GRAFT W/ INSTANT STICK GRAFT );  Surgeon: Katha Cabal, MD;  Location: [facility];  Service: Vascular;  Laterality: Left;  . AV FISTULA PLACEMENT Right 07/19/2018   Procedure: INSERTION OF ARTERIOVENOUS (AV) GORE-TEX GRAFT ARM ( BRACHIAL AXILLARY);  Surgeon: Algernon Huxley, MD;  Location: [facility];  Service: Vascular;  Laterality: Right;  . DIALYSIS FISTULA CREATION Right 10/2017   right chest perm cath  . DIALYSIS/PERMA CATHETER INSERTION N/A 06/09/2020   Procedure: DIALYSIS/PERMA CATHETER INSERTION;  Surgeon: Algernon Huxley, MD;  Location: [facility] [facility];  Service: Cardiovascular;  Laterality: N/A;  . DIALYSIS/PERMA CATHETER REMOVAL N/A 09/13/2018   Procedure: DIALYSIS/PERMA CATHETER REMOVAL;  Surgeon: Algernon Huxley, MD;  Location: [place] [facility];  Service: Cardiovascular;  Laterality: N/A;  . ESOPHAGOGASTRODUODENOSCOPY N/A 12/19/2017   Procedure: ESOPHAGOGASTRODUODENOSCOPY (EGD);  Surgeon: Lin Landsman, MD;  Location: [facility];  Service: Gastroenterology;  Laterality: N/A;  . LOWER EXTREMITY ANGIOGRAPHY Left 11/16/2017   Procedure: LOWER EXTREMITY ANGIOGRAPHY;  Surgeon: Algernon Huxley, MD;  Location: [facility];  Service: Cardiovascular;  Laterality: Left;  . LOWER EXTREMITY ANGIOGRAPHY Right 01/18/2018   Procedure: LOWER EXTREMITY ANGIOGRAPHY;  Surgeon: Algernon Huxley, MD;  Location: [place] [facility];  Service: Cardiovascular;  Laterality: Right;  . LOWER EXTREMITY ANGIOGRAPHY Left 04/02/2018   Procedure: LOWER EXTREMITY ANGIOGRAPHY;  Surgeon: Algernon Huxley, MD;  Location: [facility];  Service: Cardiovascular;  Laterality: Left;  . LOWER EXTREMITY ANGIOGRAPHY Right 04/09/2018   Procedure: Lower Extremity Angiography with possible intervention;  Surgeon: Algernon Huxley, MD;  Location: [place] [facility];  Service: Cardiovascular;  Laterality: Right;  . LOWER EXTREMITY ANGIOGRAPHY Right 07/23/2018    Procedure: Lower Extremity Angiography;  Surgeon: Algernon Huxley, MD;  Location: [place] [facility];  Service: Cardiovascular;  Laterality: Right;  . LOWER EXTREMITY ANGIOGRAPHY Right 09/13/2018   Procedure: LOWER EXTREMITY ANGIOGRAPHY;  Surgeon: Algernon Huxley, MD;  Location: [facility] [facility];  Service: Cardiovascular;  Laterality: Right;  . LOWER EXTREMITY VENOGRAPHY Right 09/13/2018   Procedure: LOWER EXTREMITY VENOGRAPHY;  Surgeon: Algernon Huxley, MD;  Location: [place] [facility];  Service: Cardiovascular;  Laterality: Right;  . PERIPHERAL VASCULAR CATHETERIZATION Left 09/01/2015   Procedure: A/V Shuntogram/Fistulagram;  Surgeon: Katha Cabal, MD;  Location: [place] [facility];  Service: Cardiovascular;  Laterality: Left;  . PERIPHERAL VASCULAR CATHETERIZATION N/A 09/30/2015   Procedure: A/V Shuntogram/Fistulagram with perm  cathether removal;  Surgeon: Algernon Huxley, MD;  Location: [place] [facility];  Service: Cardiovascular;  Laterality: N/A;  . PERIPHERAL VASCULAR CATHETERIZATION Left 09/30/2015   Procedure: A/V Shunt Intervention;  Surgeon: Algernon Huxley, MD;  Location: [place] [facility];  Service: Cardiovascular;  Laterality: Left;  . PERIPHERAL VASCULAR CATHETERIZATION Left 12/03/2015   Procedure: Thrombectomy;  Surgeon: Algernon Huxley, MD;  Location: [place] [facility];  Service: Cardiovascular;  Laterality: Left;  . PERIPHERAL VASCULAR CATHETERIZATION Left 01/28/2016   Procedure: Thrombectomy;  Surgeon: Algernon Huxley, MD;  Location: [place];  Service: Cardiovascular;  Laterality: Left;  . PERIPHERAL VASCULAR CATHETERIZATION N/A 01/28/2016   Procedure: A/V Shuntogram/Fistulagram;  Surgeon: Algernon Huxley, MD;  Location: [place] [facility];  Service: Cardiovascular;  Laterality: N/A;  . SKIN SPLIT GRAFT Right 05/24/2018   Procedure: SKIN GRAFT SPLIT THICKNESS ( RIGHT CALF);  Surgeon: Algernon Huxley, MD;  Location: [facility];  Service: Vascular;  Laterality: Right;  . UPPER  EXTREMITY ANGIOGRAPHY  10/24/2018   Procedure: Upper Extremity Angiography;  Surgeon: Algernon Huxley, MD;  Location: [place] [facility];  Service: Cardiovascular;;  . UPPER EXTREMITY ANGIOGRAPHY Right 02/14/2019   Procedure: UPPER EXTREMITY ANGIOGRAPHY;  Surgeon: Algernon Huxley, MD;  Location: [facility] [place] [facility];  Service: Cardiovascular;  Laterality: Right;  . UPPER EXTREMITY ANGIOGRAPHY Right 04/20/2020   Procedure: UPPER EXTREMITY ANGIOGRAPHY;  Surgeon: Algernon Huxley, MD;  Location: [place] [facility];  Service: Cardiovascular;  Laterality: Right;  . WOUND DEBRIDEMENT Right 04/11/2018   Procedure: DEBRIDEMENT WOUND calf muscle and skin;  Surgeon: Algernon Huxley, MD;  Location: [facility];  Service: Vascular;  Laterality: Right;    Family History  Problem Relation Age of Onset  . Hypertension Other   . Heart disease Other   . Diabetes Mother     Allergies  Allergen Reactions  . Shellfish Allergy Anaphylaxis  . Other     CBC Latest Ref Rng & Units 06/30/2020 06/29/2020 06/28/2020  WBC 4.0 - 10.5 K/uL 8.3 7.9 7.4  Hemoglobin 13.0 - 17.0 g/dL 9.7(L) 9.9(L) 10.9(L)  Hematocrit 39.0 - 52.0 % 29.1(L) 30.5(L) 33.8(L)  Platelets 150 - 400 K/uL 170 168 162      CMP     Component Value Date/Time   NA 135 06/30/2020 0505   NA 139 01/17/2014 0502   K 4.4 06/30/2020 0505   K 4.8 01/17/2014 0502   CL 98 06/30/2020 0505   CL 102 01/17/2014 0502   CO2 23 06/30/2020 0505   CO2 27 01/17/2014 0502   GLUCOSE 77 06/30/2020 0505   GLUCOSE 101 (H) 01/17/2014 0502   BUN 47 (H) 06/30/2020 0505   BUN 62 (H) 01/17/2014 0502   CREATININE 11.51 (H) 06/30/2020 0505   CREATININE 12.30 (H) 01/17/2014 0502   CALCIUM 8.5 (L) 06/30/2020 0505   CALCIUM 8.2 (L) 01/17/2014 0502   PROT 6.7 06/24/2020 2320   PROT 7.9 06/20/2011 2221   ALBUMIN 3.4 (L) 06/24/2020 2320   ALBUMIN 3.1 (L) 01/16/2014 1336   AST 13 (L) 06/24/2020 2320   AST 33 06/20/2011 2221   ALT 7 06/24/2020 2320   ALT 22 06/20/2011 2221    ALKPHOS 51 06/24/2020 2320   ALKPHOS 48 (L) 06/20/2011 2221   BILITOT 0.4 06/24/2020 2320   BILITOT 0.3 06/20/2011 2221   GFRNONAA 4 (L) 06/30/2020 0505   GFRNONAA 4 (L) 01/17/2014 0502   GFRAA 6 (L) 01/21/2020 1056  GFRAA 4 (L) 01/17/2014 0502     No results found.     Assessment & Plan:   1. End stage renal disease ([place]) The patient has had access in his left upper extremity and given the severe steal symptoms in his right left upper extremity it would not be in the patient's best interest to try to reopen his right hero graft.  Would also not be in the patient's best interest to plan any upper extremity access.  Given the severe PAD that the patient has a lower extremity graft placement may result in still symptoms or a subsequent amputation.  Based on this, it is felt that it is in the patient's best interest to remain catheter dependent at this time.  2. Atherosclerosis of native arteries of the extremities with ulceration ([place]) The patient has not had noninvasive studies done in his lower extremities in several months.  We will have the patient return in the next several weeks to perform evaluation on his left lower extremity.  3. Hypertension, unspecified type Continue antihypertensive medications as already ordered, these medications have been reviewed and there are no changes at this time.   4. Hyperlipidemia, unspecified hyperlipidemia type Continue statin as ordered and reviewed, no changes at this time    Current Outpatient Medications on File Prior to Visit  Medication Sig Dispense Refill  . allopurinol (ZYLOPRIM) 100 MG tablet Take 100 mg by mouth daily.    [name] aspirin EC 81 MG tablet Take 1 tablet (81 mg total) by mouth daily. 150 tablet 2  . atorvastatin (LIPITOR) 10 MG tablet Take 1 tablet by mouth daily.    . clonazePAM (KLONOPIN) 0.5 MG tablet Take 1 tablet (0.5 mg total) by mouth as needed. (Patient taking differently: Take 0.5 mg by mouth 2 (two) times daily.)  10 tablet 0  . clopidogrel (PLAVIX) 75 MG tablet Take 1 tablet (75 mg total) by mouth daily. 30 tablet 11  . folic acid-vitamin b complex-vitamin c-selenium-zinc (DIALYVITE) 3 MG TABS tablet Take 1 tablet by mouth daily.     [name] gabapentin (NEURONTIN) 100 MG capsule Take 2 capsules (200 mg total) by mouth 2 (two) times daily. After dialysis session    . hydrALAZINE (APRESOLINE) 25 MG tablet Hold until followup with outpatient doctor due to intermittently low blood pressure.    [name] losartan (COZAAR) 100 MG tablet Hold until followup with outpatient doctor due to intermittently low blood pressure.    . metoprolol tartrate (LOPRESSOR) 25 MG tablet Take 25 mg by mouth every evening.    [name] oxyCODONE (ROXICODONE) 5 MG immediate release tablet Take 1-2 tablets (5-10 mg total) by mouth every 6 (six) hours as needed for severe pain (No more than 6 tabs daily). 20 tablet 0  . sevelamer carbonate (RENVELA) 800 MG tablet Take 2,400 mg by mouth 3 (three) times daily with meals.      No current facility-administered medications on file prior to visit.    There are no Patient Instructions on file for this visit. No follow-ups on file.   Kris Hartmann, NP

## 2020-07-28 ENCOUNTER — Telehealth (INDEPENDENT_AMBULATORY_CARE_PROVIDER_SITE_OTHER): Payer: Self-pay

## 2020-07-28 NOTE — Telephone Encounter (Signed)
Johnny [name] from [facility] left a voicemail requesting verbal orders to work on ambulation for twice for four weeks and once for four weeks. I called and informed Erline Levine that verbals orders were fine per Dr Lucky Cowboy.

## 2020-07-29 ENCOUNTER — Telehealth (INDEPENDENT_AMBULATORY_CARE_PROVIDER_SITE_OTHER): Payer: Self-pay

## 2020-07-29 NOTE — Telephone Encounter (Signed)
A fax was received from [place]. [employer]. regarding the patient needing a cath exchange. Patient was to be scheduled for today. I contacted the patient's caregiver and she wanted me to contact [place] at [facility]. [place]. I spoke with Elta Guadeloupe and due to the patient having ACTA which will not take a patient 2 days back to back to appt's, the patient will have to go elsewhere to have his cath exchange.

## 2020-07-30 ENCOUNTER — Encounter
Admission: EM | Disposition: A | Discharge status: Home / Self Care | Payer: Self-pay | Source: Home / Self Care | Attending: Emergency Medicine

## 2020-07-30 ENCOUNTER — Other Ambulatory Visit: Payer: Self-pay

## 2020-07-30 ENCOUNTER — Ambulatory Visit
Admission: EM | Admit: 2020-07-30 | Discharge: 2020-07-30 | Disposition: A | Discharge status: Home / Self Care | Payer: Medicare Other | Attending: Vascular Surgery | Admitting: Vascular Surgery

## 2020-07-30 ENCOUNTER — Other Ambulatory Visit (INDEPENDENT_AMBULATORY_CARE_PROVIDER_SITE_OTHER): Payer: Self-pay | Admitting: Vascular Surgery

## 2020-07-30 ENCOUNTER — Emergency Department: Payer: Medicare Other

## 2020-07-30 ENCOUNTER — Emergency Department
Admission: EM | Admit: 2020-07-30 | Discharge: 2020-07-30 | Disposition: A | Discharge status: Home / Self Care | Payer: Medicare Other | Source: Home / Self Care | Attending: Emergency Medicine | Admitting: Emergency Medicine

## 2020-07-30 DIAGNOSIS — T8249XA Other complication of vascular dialysis catheter, initial encounter: Secondary | ICD-10-CM | POA: Diagnosis not present

## 2020-07-30 DIAGNOSIS — I132 Hypertensive heart and chronic kidney disease with heart failure and with stage 5 chronic kidney disease, or end stage renal disease: Secondary | ICD-10-CM | POA: Insufficient documentation

## 2020-07-30 DIAGNOSIS — R269 Unspecified abnormalities of gait and mobility: Secondary | ICD-10-CM | POA: Insufficient documentation

## 2020-07-30 DIAGNOSIS — T82838A Hemorrhage of vascular prosthetic devices, implants and grafts, initial encounter: Secondary | ICD-10-CM

## 2020-07-30 DIAGNOSIS — I509 Heart failure, unspecified: Secondary | ICD-10-CM | POA: Insufficient documentation

## 2020-07-30 DIAGNOSIS — Z992 Dependence on renal dialysis: Secondary | ICD-10-CM | POA: Insufficient documentation

## 2020-07-30 DIAGNOSIS — Z7982 Long term (current) use of aspirin: Secondary | ICD-10-CM | POA: Insufficient documentation

## 2020-07-30 DIAGNOSIS — E1151 Type 2 diabetes mellitus with diabetic peripheral angiopathy without gangrene: Secondary | ICD-10-CM | POA: Diagnosis not present

## 2020-07-30 DIAGNOSIS — Z79899 Other long term (current) drug therapy: Secondary | ICD-10-CM | POA: Insufficient documentation

## 2020-07-30 DIAGNOSIS — Z833 Family history of diabetes mellitus: Secondary | ICD-10-CM | POA: Insufficient documentation

## 2020-07-30 DIAGNOSIS — T82898A Other specified complication of vascular prosthetic devices, implants and grafts, initial encounter: Secondary | ICD-10-CM

## 2020-07-30 DIAGNOSIS — Z8249 Family history of ischemic heart disease and other diseases of the circulatory system: Secondary | ICD-10-CM | POA: Insufficient documentation

## 2020-07-30 DIAGNOSIS — Z91013 Allergy to seafood: Secondary | ICD-10-CM | POA: Insufficient documentation

## 2020-07-30 DIAGNOSIS — I252 Old myocardial infarction: Secondary | ICD-10-CM | POA: Diagnosis not present

## 2020-07-30 DIAGNOSIS — Z95828 Presence of other vascular implants and grafts: Secondary | ICD-10-CM

## 2020-07-30 DIAGNOSIS — T8242XA Displacement of vascular dialysis catheter, initial encounter: Secondary | ICD-10-CM

## 2020-07-30 DIAGNOSIS — E1122 Type 2 diabetes mellitus with diabetic chronic kidney disease: Secondary | ICD-10-CM | POA: Insufficient documentation

## 2020-07-30 DIAGNOSIS — Z8616 Personal history of COVID-19: Secondary | ICD-10-CM | POA: Insufficient documentation

## 2020-07-30 DIAGNOSIS — Z87891 Personal history of nicotine dependence: Secondary | ICD-10-CM | POA: Insufficient documentation

## 2020-07-30 DIAGNOSIS — N186 End stage renal disease: Secondary | ICD-10-CM

## 2020-07-30 DIAGNOSIS — E1169 Type 2 diabetes mellitus with other specified complication: Secondary | ICD-10-CM

## 2020-07-30 DIAGNOSIS — X58XXXA Exposure to other specified factors, initial encounter: Secondary | ICD-10-CM | POA: Diagnosis not present

## 2020-07-30 DIAGNOSIS — Z89422 Acquired absence of other left toe(s): Secondary | ICD-10-CM

## 2020-07-30 HISTORY — PX: DIALYSIS/PERMA CATHETER INSERTION: CATH118288

## 2020-07-30 LAB — BASIC METABOLIC PANEL
Anion gap: 13 (ref 5–15)
Anion gap: 12 (ref 5–15)
BUN: 48 mg/dL — ABNORMAL HIGH (ref 8–23)
BUN: 52 mg/dL — ABNORMAL HIGH (ref 8–23)
CO2: 23 mmol/L (ref 22–32)
CO2: 22 mmol/L (ref 22–32)
Calcium: 9.4 mg/dL (ref 8.9–10.3)
Calcium: 9.1 mg/dL (ref 8.9–10.3)
Chloride: 106 mmol/L (ref 98–111)
Chloride: 108 mmol/L (ref 98–111)
Creatinine, Ser: 10.86 mg/dL — ABNORMAL HIGH (ref 0.61–1.24)
Creatinine, Ser: 11.35 mg/dL — ABNORMAL HIGH (ref 0.61–1.24)
GFR, Estimated: 5 mL/min — ABNORMAL LOW (ref >60)
GFR, Estimated: 4 mL/min — ABNORMAL LOW (ref >60)
Glucose, Bld: 70 mg/dL (ref 70–99)
Glucose, Bld: 113 mg/dL — ABNORMAL HIGH (ref 70–99)
Potassium: 5.4 mmol/L — ABNORMAL HIGH (ref 3.5–5.1)
Potassium: 4.9 mmol/L (ref 3.5–5.1)
Sodium: 142 mmol/L (ref 135–145)
Sodium: 142 mmol/L (ref 135–145)

## 2020-07-30 LAB — CBC
HCT: 36.7 % — ABNORMAL LOW (ref 39.0–52.0)
HCT: 33.6 % — ABNORMAL LOW (ref 39.0–52.0)
Hemoglobin: 11.3 g/dL — ABNORMAL LOW (ref 13.0–17.0)
Hemoglobin: 10.7 g/dL — ABNORMAL LOW (ref 13.0–17.0)
MCH: 31.3 pg (ref 26.0–34.0)
MCH: 32.5 pg (ref 26.0–34.0)
MCHC: 30.8 g/dL (ref 30.0–36.0)
MCHC: 31.8 g/dL (ref 30.0–36.0)
MCV: 101.7 fL — ABNORMAL HIGH (ref 80.0–100.0)
MCV: 102.1 fL — ABNORMAL HIGH (ref 80.0–100.0)
Platelets: 149 10*3/uL — ABNORMAL LOW (ref 150–400)
Platelets: 139 10*3/uL — ABNORMAL LOW (ref 150–400)
RBC: 3.61 MIL/uL — ABNORMAL LOW (ref 4.22–5.81)
RBC: 3.29 MIL/uL — ABNORMAL LOW (ref 4.22–5.81)
RDW: 14.8 % (ref 11.5–15.5)
RDW: 14.6 % (ref 11.5–15.5)
WBC: 7.1 10*3/uL (ref 4.0–10.5)
WBC: 7.6 10*3/uL (ref 4.0–10.5)
nRBC: 0 % (ref 0.0–0.2)
nRBC: 0 % (ref 0.0–0.2)

## 2020-07-30 LAB — PROTIME-INR
INR: 1.1 (ref 0.8–1.2)
Prothrombin Time: 13.6 seconds (ref 11.4–15.2)

## 2020-07-30 LAB — APTT: aPTT: 55 seconds — ABNORMAL HIGH (ref 24–36)

## 2020-07-30 SURGERY — DIALYSIS/PERMA CATHETER INSERTION
Anesthesia: Moderate Sedation

## 2020-07-30 MED ORDER — ONDANSETRON HCL 4 MG/2ML IJ SOLN: 4.0000 mg | Freq: Four times a day (QID) | INTRAMUSCULAR | Status: DC | PRN

## 2020-07-30 MED ORDER — FAMOTIDINE 20 MG PO TABS: 40.0000 mg | ORAL_TABLET | Freq: Once | ORAL | Status: DC | PRN

## 2020-07-30 MED ORDER — SODIUM CHLORIDE 0.9 % IV SOLN: INTRAVENOUS | Status: DC

## 2020-07-30 MED ORDER — CLONAZEPAM 0.5 MG PO TBDP
0.5000 mg | ORAL_TABLET | Freq: Once | ORAL | Status: AC
  Administered 2020-07-30: 0.5 mg via ORAL
  Filled 2020-07-30: qty 1

## 2020-07-30 MED ORDER — HYDROMORPHONE HCL 1 MG/ML IJ SOLN: 1.0000 mg | Freq: Once | INTRAMUSCULAR | Status: DC | PRN

## 2020-07-30 MED ORDER — METHYLPREDNISOLONE SODIUM SUCC 125 MG IJ SOLR: 125.0000 mg | Freq: Once | INTRAMUSCULAR | Status: DC | PRN

## 2020-07-30 MED ORDER — MIDAZOLAM HCL 2 MG/2ML IJ SOLN
INTRAMUSCULAR | Status: DC | PRN
  Administered 2020-07-30: 2 mg via INTRAVENOUS

## 2020-07-30 MED ORDER — FENTANYL CITRATE (PF) 100 MCG/2ML IJ SOLN
INTRAMUSCULAR | Status: AC
  Filled 2020-07-30: qty 2

## 2020-07-30 MED ORDER — MIDAZOLAM HCL 2 MG/2ML IJ SOLN
INTRAMUSCULAR | Status: AC
  Filled 2020-07-30: qty 2

## 2020-07-30 MED ORDER — DIPHENHYDRAMINE HCL 50 MG/ML IJ SOLN: 50.0000 mg | Freq: Once | INTRAMUSCULAR | Status: DC | PRN

## 2020-07-30 MED ORDER — MIDAZOLAM HCL 2 MG/ML PO SYRP: 8.0000 mg | ORAL_SOLUTION | Freq: Once | ORAL | Status: DC | PRN

## 2020-07-30 MED ORDER — CEFAZOLIN SODIUM-DEXTROSE 1-4 GM/50ML-% IV SOLN: 1.0000 g | Freq: Once | INTRAVENOUS | Status: DC

## 2020-07-30 SURGICAL SUPPLY — 10 items
BIOPATCH RED 1 DISK 7.0 (GAUZE/BANDAGES/DRESSINGS) ×2 IMPLANT
CATH PALINDROME-P 44CM KIT (CATHETERS) ×2
GUIDEWIRE SUPER STIFF .035X180 (WIRE) ×2 IMPLANT
KIT CATH CHRNC PALINDROME PRCS (CATHETERS) ×1 IMPLANT
PACK ANGIOGRAPHY (CUSTOM PROCEDURE TRAY) ×2 IMPLANT
SUT MNCRL 4-0 (SUTURE) ×2
SUT MNCRL 4-0 27XMFL (SUTURE) ×1
SUT PROLENE 0 CT 1 30 (SUTURE) ×2 IMPLANT
SUTURE MNCRL 4-0 27XMF (SUTURE) ×1 IMPLANT
TRAY LACERAT/PLASTIC (MISCELLANEOUS) ×2 IMPLANT

## 2020-07-30 NOTE — ED Provider Notes (Signed)
[facility] Emergency Department Provider Note  Time seen: 7:49 PM  I have reviewed the triage vital signs and the nursing notes.   HISTORY  Chief Complaint Vascular Access Problem   HPI Johnny [name]. is a 70 y.o. male with a past medical history of CHF, ESRD on HD, hypertension, hyperlipidemia, presents emergency department for an oozing left femoral vein catheter.  According to the patient record review patient was seen in the emergency department earlier today for a displaced dialysis catheter.  Patient was seen by Dr. Leotis Pain of vascular surgery and had a left femoral vein catheter placed.  Patient ultimately discharged home.  Patient states since going home he has had continued oozing from the catheter insertion site and bled through his dressing so he came back to the emergency department for evaluation.  Here the patient appears well, no distress.  Has no other medical complaints.   Past Medical History:  Diagnosis Date  . Anemia   . Anxiety   . CHF (congestive heart failure) ([place])   . Chronic kidney disease    esrd dialysis m/w/f  . Gout   . Hyperlipidemia   . Hypertension   . Myocardial infarction ([place]) 2010   10 years ago  . 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  . Peripheral vascular disease [facility])     Patient Active Problem List   Diagnosis Date Noted  . Acquired absence of other left toe(s) ([place]) 07/30/2020  . Type 2 diabetes mellitus with other specified complication, unspecified whether long term insulin use ([place]) 07/30/2020  . Dyspnea 06/28/2020  . COVID-19 virus infection 06/25/2020  . Hypotension of hemodialysis 06/25/2020  . Hypotension 06/25/2020  . Cardiomyopathy ([place]) 01/22/2019  . CHF (congestive heart failure) ([place]) 01/22/2019  . Coronary disease 01/22/2019  . 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 Leg ulcer, right, with fat layer exposed ([place]) 10/22/2018  .  Cellulitis of right leg 07/21/2018  . Lower limb ulcer, calf, right, limited to breakdown of skin ([place]) 06/08/2018  . End-stage renal disease ([place]) 05/09/2018  . PVD (peripheral vascular disease) ([place]) 04/17/2018  . Malnutrition of moderate degree 04/11/2018  . Pressure injury of skin 04/06/2018  . Altered mental status 04/04/2018  . Hypothermia 04/04/2018  . Hemodialysis graft malfunction ([place]) 03/26/2018  . Hypercholesterolemia 02/27/2018  . Diabetes ([place]) 02/27/2018  . Weakness of right lower extremity 01/20/2018  . Fever   . Periumbilical abdominal pain   . Confusion 12/22/2017  . Acute delirium 12/21/2017  . Protein-calorie malnutrition, severe 12/19/2017  . Intractable nausea and vomiting 12/18/2017  . Lymphedema 12/13/2017  . Cellulitis 11/27/2017  . Chest pain 11/19/2017  . Atherosclerosis of native arteries of the extremities with ulceration ([place]) 11/07/2017  . Twitching 01/03/2017  . Elevated troponin 10/02/2015  . Complications, dialysis, catheter, mechanical ([place]) 10/02/2015  . Musculoskeletal chest pain 09/28/2015  . Chronic diastolic CHF (congestive heart failure) ([place]) 09/28/2015  . ESRD on hemodialysis ([place]) 10/09/2012  . Hypertension 10/09/2012  . Gout 10/09/2012    Past Surgical History:  Procedure Laterality Date  . A/V FISTULAGRAM Right 09/06/2018   Procedure: A/V FISTULAGRAM;  Surgeon: Algernon Huxley, MD;  Location: [place] [facility];  Service: Cardiovascular;  Laterality: Right;  . A/V SHUNTOGRAM Left 06/21/2017   Procedure: A/V SHUNTOGRAM;  Surgeon: Katha Cabal, MD;  Location: [place] [facility];  Service: Cardiovascular;  Laterality: Left;  . A/V SHUNTOGRAM N/A 10/24/2018  Procedure: A/V SHUNTOGRAM;  Surgeon: Algernon Huxley, MD;  Location: [place] [facility];  Service: Cardiovascular;  Laterality: N/A;  . ABOVE KNEE LEG AMPUTATION Right 2020  . AMPUTATION Right 10/25/2018   Procedure: AMPUTATION ABOVE KNEE;  Surgeon: Algernon Huxley, MD;   Location: [facility] ORS;  Service: General;  Laterality: Right;  . APPLICATION OF WOUND VAC Right 04/11/2018   Procedure: APPLICATION OF WOUND VAC;  Surgeon: Algernon Huxley, MD;  Location: [facility];  Service: Vascular;  Laterality: Right;  . AV FISTULA PLACEMENT Left 09/18/2015   Procedure: INSERTION OF ARTERIOVENOUS (AV) GORE-TEX GRAFT ARM ( BRACH/AXILLARY GRAFT W/ INSTANT STICK GRAFT );  Surgeon: Katha Cabal, MD;  Location: [facility];  Service: Vascular;  Laterality: Left;  . AV FISTULA PLACEMENT Right 07/19/2018   Procedure: INSERTION OF ARTERIOVENOUS (AV) GORE-TEX GRAFT ARM ( BRACHIAL AXILLARY);  Surgeon: Algernon Huxley, MD;  Location: [facility];  Service: Vascular;  Laterality: Right;  . DIALYSIS FISTULA CREATION Right 10/2017   right chest perm cath  . DIALYSIS/PERMA CATHETER INSERTION N/A 06/09/2020   Procedure: DIALYSIS/PERMA CATHETER INSERTION;  Surgeon: Algernon Huxley, MD;  Location: [place] [facility];  Service: Cardiovascular;  Laterality: N/A;  . DIALYSIS/PERMA CATHETER REMOVAL N/A 09/13/2018   Procedure: DIALYSIS/PERMA CATHETER REMOVAL;  Surgeon: Algernon Huxley, MD;  Location: [place] [facility];  Service: Cardiovascular;  Laterality: N/A;  . ESOPHAGOGASTRODUODENOSCOPY N/A 12/19/2017   Procedure: ESOPHAGOGASTRODUODENOSCOPY (EGD);  Surgeon: Lin Landsman, MD;  Location: [facility];  Service: Gastroenterology;  Laterality: N/A;  . LOWER EXTREMITY ANGIOGRAPHY Left 11/16/2017   Procedure: LOWER EXTREMITY ANGIOGRAPHY;  Surgeon: Algernon Huxley, MD;  Location: [place] [facility];  Service: Cardiovascular;  Laterality: Left;  . LOWER EXTREMITY ANGIOGRAPHY Right 01/18/2018   Procedure: LOWER EXTREMITY ANGIOGRAPHY;  Surgeon: Algernon Huxley, MD;  Location: [place] [facility];  Service: Cardiovascular;  Laterality: Right;  . LOWER EXTREMITY ANGIOGRAPHY Left 04/02/2018   Procedure: LOWER EXTREMITY ANGIOGRAPHY;  Surgeon: Algernon Huxley, MD;  Location: [place] [facility];  Service: Cardiovascular;   Laterality: Left;  . LOWER EXTREMITY ANGIOGRAPHY Right 04/09/2018   Procedure: Lower Extremity Angiography with possible intervention;  Surgeon: Algernon Huxley, MD;  Location: [place] [facility];  Service: Cardiovascular;  Laterality: Right;  . LOWER EXTREMITY ANGIOGRAPHY Right 07/23/2018   Procedure: Lower Extremity Angiography;  Surgeon: Algernon Huxley, MD;  Location: [place] [facility];  Service: Cardiovascular;  Laterality: Right;  . LOWER EXTREMITY ANGIOGRAPHY Right 09/13/2018   Procedure: LOWER EXTREMITY ANGIOGRAPHY;  Surgeon: Algernon Huxley, MD;  Location: [place] [facility];  Service: Cardiovascular;  Laterality: Right;  . LOWER EXTREMITY VENOGRAPHY Right 09/13/2018   Procedure: LOWER EXTREMITY VENOGRAPHY;  Surgeon: Algernon Huxley, MD;  Location: [place] [facility];  Service: Cardiovascular;  Laterality: Right;  . PERIPHERAL VASCULAR CATHETERIZATION Left 09/01/2015   Procedure: A/V Shuntogram/Fistulagram;  Surgeon: Katha Cabal, MD;  Location: [place] [facility];  Service: Cardiovascular;  Laterality: Left;  . PERIPHERAL VASCULAR CATHETERIZATION N/A 09/30/2015   Procedure: A/V Shuntogram/Fistulagram with perm cathether removal;  Surgeon: Algernon Huxley, MD;  Location: [place] [facility];  Service: Cardiovascular;  Laterality: N/A;  . PERIPHERAL VASCULAR CATHETERIZATION Left 09/30/2015   Procedure: A/V Shunt Intervention;  Surgeon: Algernon Huxley, MD;  Location: [place] [facility];  Service: Cardiovascular;  Laterality: Left;  . PERIPHERAL VASCULAR CATHETERIZATION Left 12/03/2015   Procedure: Thrombectomy;  Surgeon: Algernon Huxley, MD;  Location: [place] [facility];  Service: Cardiovascular;  Laterality: Left;  . PERIPHERAL VASCULAR CATHETERIZATION Left 01/28/2016   Procedure: Thrombectomy;  Surgeon: Algernon Huxley, MD;  Location: [place] [facility];  Service: Cardiovascular;  Laterality: Left;  . PERIPHERAL VASCULAR CATHETERIZATION N/A 01/28/2016   Procedure: A/V Shuntogram/Fistulagram;  Surgeon:  Algernon Huxley, MD;  Location: [place] [facility];  Service: Cardiovascular;  Laterality: N/A;  . SKIN SPLIT GRAFT Right 05/24/2018   Procedure: SKIN GRAFT SPLIT THICKNESS ( RIGHT CALF);  Surgeon: Algernon Huxley, MD;  Location: [facility];  Service: Vascular;  Laterality: Right;  . UPPER EXTREMITY ANGIOGRAPHY  10/24/2018   Procedure: Upper Extremity Angiography;  Surgeon: Algernon Huxley, MD;  Location: [place] [facility];  Service: Cardiovascular;;  . UPPER EXTREMITY ANGIOGRAPHY Right 02/14/2019   Procedure: UPPER EXTREMITY ANGIOGRAPHY;  Surgeon: Algernon Huxley, MD;  Location: [place] [facility];  Service: Cardiovascular;  Laterality: Right;  . UPPER EXTREMITY ANGIOGRAPHY Right 04/20/2020   Procedure: UPPER EXTREMITY ANGIOGRAPHY;  Surgeon: Algernon Huxley, MD;  Location: [place] [facility];  Service: Cardiovascular;  Laterality: Right;  . WOUND DEBRIDEMENT Right 04/11/2018   Procedure: DEBRIDEMENT WOUND calf muscle and skin;  Surgeon: Algernon Huxley, MD;  Location: [facility];  Service: Vascular;  Laterality: Right;    Prior to Admission medications   Medication Sig Start Date End Date Taking? Authorizing Provider  allopurinol (ZYLOPRIM) 100 MG tablet Take 100 mg by mouth daily.    [provider]  aspirin EC 81 MG tablet Take 1 tablet (81 mg total) by mouth daily. 04/20/20   Algernon Huxley, MD  atorvastatin (LIPITOR) 10 MG tablet Take 1 tablet by mouth daily. 11/23/17   [provider]  clonazePAM (KLONOPIN) 0.5 MG tablet Take 1 tablet (0.5 mg total) by mouth as needed. Patient taking differently: Take 0.5 mg by mouth 2 (two) times daily. 07/26/18   Bettey Costa, MD  clopidogrel (PLAVIX) 75 MG tablet Take 1 tablet (75 mg total) by mouth daily. 04/02/18   Algernon Huxley, MD  folic acid-vitamin b complex-vitamin c-selenium-zinc (DIALYVITE) 3 MG TABS tablet Take 1 tablet by mouth daily.     [provider]  gabapentin (NEURONTIN) 100 MG capsule Take 2 capsules (200 mg total) by mouth 2  (two) times daily. After dialysis session 06/28/20   Enzo Bi, MD  hydrALAZINE (APRESOLINE) 25 MG tablet Hold until followup with outpatient doctor due to intermittently low blood pressure. 06/28/20   Enzo Bi, MD  losartan (COZAAR) 100 MG tablet Hold until followup with outpatient doctor due to intermittently low blood pressure. 06/28/20   Enzo Bi, MD  metoprolol tartrate (LOPRESSOR) 25 MG tablet Take 25 mg by mouth every evening.    [provider]  oxyCODONE (ROXICODONE) 5 MG immediate release tablet Take 1-2 tablets (5-10 mg total) by mouth every 6 (six) hours as needed for severe pain (No more than 6 tabs daily). 05/26/20 05/26/21  Duffy Bruce, MD  sevelamer carbonate (RENVELA) 800 MG tablet Take 2,400 mg by mouth 3 (three) times daily with meals.  09/20/18   [provider]    Allergies  Allergen Reactions  . Shellfish Allergy Anaphylaxis  . Other     Family History  Problem Relation Age of Onset  . Hypertension Other   . Heart disease Other   . Diabetes Mother     Social History Social History   Tobacco Use  . Smoking status: Former Smoker  Types: Cigarettes    Quit date: 05/17/2005    Years since quitting: 15.2  . Smokeless tobacco: Never Used  Vaping Use  . Vaping Use: Never used  Substance Use Topics  . Alcohol use: No  . Drug use: No    Review of Systems Constitutional: Negative for fever. Cardiovascular: Negative for chest pain. Respiratory: Negative for shortness of breath. Gastrointestinal: Negative for abdominal pain, vomiting Musculoskeletal: Bleeding from the left femoral catheter insertion site. Skin: Negative for skin complaints  Neurological: Negative for headache All other ROS negative  ____________________________________________   PHYSICAL EXAM:  VITAL SIGNS: ED Triage Vitals  Enc Vitals Group     BP 07/30/20 1926 (!) 177/85     Pulse Rate 07/30/20 1926 84     Resp 07/30/20 1926 18     Temp 07/30/20 1926 98.4 F  (36.9 C)     Temp Source 07/30/20 1926 Oral     SpO2 07/30/20 1922 99 %     Weight 07/30/20 1926 169 lb 1.5 oz (76.7 kg)     Height 07/30/20 1926 '5\' 8"'$  (1.727 m)     Head Circumference --      Peak Flow --      Pain Score 07/30/20 1926 0     Pain Loc --      Pain Edu? --      Excl. in [place]? --    Constitutional: Alert and oriented. Well appearing and in no distress. Eyes: Normal exam ENT      Head: Normocephalic and atraumatic.      Mouth/Throat: Mucous membranes are moist. Cardiovascular: Normal rate, regular rhythm.  Respiratory: Normal respiratory effort without tachypnea nor retractions. Breath sounds are clear  Gastrointestinal: Soft and nontender. No distention. Musculoskeletal: Nontender with normal range of motion in all extremities.  Patient has a left femoral dialysis catheter present with very minimal oozing around the catheter insertion site as well as the suture sites. Neurologic:  Normal speech and language. No gross focal neurologic deficits Skin:  Skin is warm Psychiatric: Mood and affect are normal.   ____________________________________________   INITIAL IMPRESSION / ASSESSMENT AND PLAN / ED COURSE  Pertinent labs & imaging results that were available during my care of the patient were reviewed by me and considered in my medical decision making (see chart for details).   Patient presents emergency department for oozing from his left femoral dialysis catheter insertion site.  Overall the patient appears well has not had dialysis since Monday scheduled for dialysis tomorrow.  We will check labs including PT/PTT.  I discussed the patient with Dr.Dew who recommends wrapping the catheter in Surgicel and applying a new bandage.  We will wrap the catheter with Surgicel apply a new Biopatch.  Patient agreeable to plan of care.  New dressing applied.  Wound appears well.  Patient will be discharged home.  BMP shows a normal potassium 4.9.  Coags are pending.  As long as  coags are largely within normal limits anticipate discharge.  Rodric Hamper. was evaluated in Emergency Department on 07/30/2020 for the symptoms described in the history of present illness. He was evaluated in the context of the global COVID-19 pandemic, which necessitated consideration that the patient might be at risk for infection with the SARS-CoV-2 virus that causes COVID-19. Institutional protocols and algorithms that pertain to the evaluation of patients at risk for COVID-19 are in a state of rapid change based on information released by regulatory bodies including the CDC and federal  and state organizations. These policies and algorithms were followed during the patient's care in the ED.  ____________________________________________   FINAL CLINICAL IMPRESSION(S) / ED DIAGNOSES  Postoperative bleeding   Harvest Dark, MD 07/30/20 2049

## 2020-07-30 NOTE — ED Provider Notes (Signed)
[facility] Emergency Department Provider Note  ____________________________________________  Time seen: Approximately 2:05 PM  I have reviewed the triage vital signs and the nursing notes.   HISTORY  Chief Complaint Vascular Access Problem    HPI Johnny [name]. is a 70 y.o. male with a history of CHF ESRD on hemodialysis, hypertension who comes to the ED today due to inability to use his dialysis catheter.  Last dialysis was Friday, 1 week ago.  He tried going to dialysis 3 days ago on Monday, and he noted that his catheter seemed to have been pulled back some from its skin entry site.  He went back today and they again refused to do dialysis and said he needed to be evaluated.  He denies any complaints, no palpitations shortness of breath dizziness or syncope.  Does not feel weak.      Past Medical History:  Diagnosis Date  . Anemia   . Anxiety   . CHF (congestive heart failure) ([place])   . Chronic kidney disease    esrd dialysis m/w/f  . Gout   . Hyperlipidemia   . Hypertension   . Myocardial infarction ([place]) 2010   10 years ago  . 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  . Peripheral vascular disease [facility])      Patient Active Problem List   Diagnosis Date Noted  . Acquired absence of other left toe(s) ([place]) 07/30/2020  . Type 2 diabetes mellitus with other specified complication, unspecified whether long term insulin use ([place]) 07/30/2020  . Dyspnea 06/28/2020  . COVID-19 virus infection 06/25/2020  . Hypotension of hemodialysis 06/25/2020  . Hypotension 06/25/2020  . Cardiomyopathy ([place]) 01/22/2019  . CHF (congestive heart failure) ([place]) 01/22/2019  . Coronary disease 01/22/2019  . 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 Leg ulcer, right, with fat layer exposed ([place]) 10/22/2018  . Cellulitis of right leg 07/21/2018  . Lower limb ulcer, calf, right, limited to breakdown of skin  ([place]) 06/08/2018  . End-stage renal disease ([place]) 05/09/2018  . PVD (peripheral vascular disease) ([place]) 04/17/2018  . Malnutrition of moderate degree 04/11/2018  . Pressure injury of skin 04/06/2018  . Altered mental status 04/04/2018  . Hypothermia 04/04/2018  . Hemodialysis graft malfunction ([place]) 03/26/2018  . Hypercholesterolemia 02/27/2018  . Diabetes ([place]) 02/27/2018  . Weakness of right lower extremity 01/20/2018  . Fever   . Periumbilical abdominal pain   . Confusion 12/22/2017  . Acute delirium 12/21/2017  . Protein-calorie malnutrition, severe 12/19/2017  . Intractable nausea and vomiting 12/18/2017  . Lymphedema 12/13/2017  . Cellulitis 11/27/2017  . Chest pain 11/19/2017  . Atherosclerosis of native arteries of the extremities with ulceration ([place]) 11/07/2017  . Twitching 01/03/2017  . Elevated troponin 10/02/2015  . Complications, dialysis, catheter, mechanical ([place]) 10/02/2015  . Musculoskeletal chest pain 09/28/2015  . Chronic diastolic CHF (congestive heart failure) ([place]) 09/28/2015  . ESRD on hemodialysis ([place]) 10/09/2012  . Hypertension 10/09/2012  . Gout 10/09/2012     Past Surgical History:  Procedure Laterality Date  . A/V FISTULAGRAM Right 09/06/2018   Procedure: A/V FISTULAGRAM;  Surgeon: Algernon Huxley, MD;  Location: [place] CV LAB;  Service: Cardiovascular;  Laterality: Right;  . A/V SHUNTOGRAM Left 06/21/2017   Procedure: A/V SHUNTOGRAM;  Surgeon: Katha Cabal, MD;  Location: [place] CV LAB;  Service: Cardiovascular;  Laterality: Left;  . A/V SHUNTOGRAM N/A 10/24/2018   Procedure: A/V SHUNTOGRAM;  Surgeon: Algernon Huxley, MD;  Location: [place] CV LAB;  Service: Cardiovascular;  Laterality: N/A;  . ABOVE KNEE LEG AMPUTATION Right 2020  . AMPUTATION Right 10/25/2018   Procedure: AMPUTATION ABOVE KNEE;  Surgeon: Algernon Huxley, MD;  Location: [facility] ORS;  Service: General;  Laterality: Right;  . APPLICATION OF WOUND VAC Right 04/11/2018    Procedure: APPLICATION OF WOUND VAC;  Surgeon: Algernon Huxley, MD;  Location: [facility] ORS;  Service: Vascular;  Laterality: Right;  . AV FISTULA PLACEMENT Left 09/18/2015   Procedure: INSERTION OF ARTERIOVENOUS (AV) GORE-TEX GRAFT ARM ( BRACH/AXILLARY GRAFT W/ INSTANT STICK GRAFT );  Surgeon: Katha Cabal, MD;  Location: [facility] ORS;  Service: Vascular;  Laterality: Left;  . AV FISTULA PLACEMENT Right 07/19/2018   Procedure: INSERTION OF ARTERIOVENOUS (AV) GORE-TEX GRAFT ARM ( BRACHIAL AXILLARY);  Surgeon: Algernon Huxley, MD;  Location: [facility] ORS;  Service: Vascular;  Laterality: Right;  . DIALYSIS FISTULA CREATION Right 10/2017   right chest perm cath  . DIALYSIS/PERMA CATHETER INSERTION N/A 06/09/2020   Procedure: DIALYSIS/PERMA CATHETER INSERTION;  Surgeon: Algernon Huxley, MD;  Location: [place] CV LAB;  Service: Cardiovascular;  Laterality: N/A;  . DIALYSIS/PERMA CATHETER REMOVAL N/A 09/13/2018   Procedure: DIALYSIS/PERMA CATHETER REMOVAL;  Surgeon: Algernon Huxley, MD;  Location: [place] CV LAB;  Service: Cardiovascular;  Laterality: N/A;  . ESOPHAGOGASTRODUODENOSCOPY N/A 12/19/2017   Procedure: ESOPHAGOGASTRODUODENOSCOPY (EGD);  Surgeon: Lin Landsman, MD;  Location: [facility] ENDOSCOPY;  Service: Gastroenterology;  Laterality: N/A;  . LOWER EXTREMITY ANGIOGRAPHY Left 11/16/2017   Procedure: LOWER EXTREMITY ANGIOGRAPHY;  Surgeon: Algernon Huxley, MD;  Location: [place] CV LAB;  Service: Cardiovascular;  Laterality: Left;  . LOWER EXTREMITY ANGIOGRAPHY Right 01/18/2018   Procedure: LOWER EXTREMITY ANGIOGRAPHY;  Surgeon: Algernon Huxley, MD;  Location: [place] CV LAB;  Service: Cardiovascular;  Laterality: Right;  . LOWER EXTREMITY ANGIOGRAPHY Left 04/02/2018   Procedure: LOWER EXTREMITY ANGIOGRAPHY;  Surgeon: Algernon Huxley, MD;  Location: [place] CV LAB;  Service: Cardiovascular;  Laterality: Left;  . LOWER EXTREMITY ANGIOGRAPHY Right 04/09/2018   Procedure: Lower Extremity Angiography  with possible intervention;  Surgeon: Algernon Huxley, MD;  Location: [place] CV LAB;  Service: Cardiovascular;  Laterality: Right;  . LOWER EXTREMITY ANGIOGRAPHY Right 07/23/2018   Procedure: Lower Extremity Angiography;  Surgeon: Algernon Huxley, MD;  Location: [place] CV LAB;  Service: Cardiovascular;  Laterality: Right;  . LOWER EXTREMITY ANGIOGRAPHY Right 09/13/2018   Procedure: LOWER EXTREMITY ANGIOGRAPHY;  Surgeon: Algernon Huxley, MD;  Location: [place] CV LAB;  Service: Cardiovascular;  Laterality: Right;  . LOWER EXTREMITY VENOGRAPHY Right 09/13/2018   Procedure: LOWER EXTREMITY VENOGRAPHY;  Surgeon: Algernon Huxley, MD;  Location: [place] [facility] LAB;  Service: Cardiovascular;  Laterality: Right;  . PERIPHERAL VASCULAR CATHETERIZATION Left 09/01/2015   Procedure: A/V Shuntogram/Fistulagram;  Surgeon: Katha Cabal, MD;  Location: [place] CV LAB;  Service: Cardiovascular;  Laterality: Left;  . PERIPHERAL VASCULAR CATHETERIZATION N/A 09/30/2015   Procedure: A/V Shuntogram/Fistulagram with perm cathether removal;  Surgeon: Algernon Huxley, MD;  Location: [place] CV LAB;  Service: Cardiovascular;  Laterality: N/A;  . PERIPHERAL VASCULAR CATHETERIZATION Left 09/30/2015   Procedure: A/V Shunt Intervention;  Surgeon: Algernon Huxley, MD;  Location: [place] CV LAB;  Service: Cardiovascular;  Laterality: Left;  . PERIPHERAL VASCULAR CATHETERIZATION Left 12/03/2015   Procedure: Thrombectomy;  Surgeon: Algernon Huxley, MD;  Location: [facility] INVASIVE CV  LAB;  Service: Cardiovascular;  Laterality: Left;  . PERIPHERAL VASCULAR CATHETERIZATION Left 01/28/2016   Procedure: Thrombectomy;  Surgeon: Algernon Huxley, MD;  Location: [place] CV LAB;  Service: Cardiovascular;  Laterality: Left;  . PERIPHERAL VASCULAR CATHETERIZATION N/A 01/28/2016   Procedure: A/V Shuntogram/Fistulagram;  Surgeon: Algernon Huxley, MD;  Location: [place] CV LAB;  Service: Cardiovascular;  Laterality: N/A;  . SKIN SPLIT  GRAFT Right 05/24/2018   Procedure: SKIN GRAFT SPLIT THICKNESS ( RIGHT CALF);  Surgeon: Algernon Huxley, MD;  Location: [facility] ORS;  Service: Vascular;  Laterality: Right;  . UPPER EXTREMITY ANGIOGRAPHY  10/24/2018   Procedure: Upper Extremity Angiography;  Surgeon: Algernon Huxley, MD;  Location: [place] CV LAB;  Service: Cardiovascular;;  . UPPER EXTREMITY ANGIOGRAPHY Right 02/14/2019   Procedure: UPPER EXTREMITY ANGIOGRAPHY;  Surgeon: Algernon Huxley, MD;  Location: [place] CV LAB;  Service: Cardiovascular;  Laterality: Right;  . UPPER EXTREMITY ANGIOGRAPHY Right 04/20/2020   Procedure: UPPER EXTREMITY ANGIOGRAPHY;  Surgeon: Algernon Huxley, MD;  Location: [place] CV LAB;  Service: Cardiovascular;  Laterality: Right;  . WOUND DEBRIDEMENT Right 04/11/2018   Procedure: DEBRIDEMENT WOUND calf muscle and skin;  Surgeon: Algernon Huxley, MD;  Location: [facility] ORS;  Service: Vascular;  Laterality: Right;     Prior to Admission medications   Medication Sig Start Date End Date Taking? Authorizing Provider  allopurinol (ZYLOPRIM) 100 MG tablet Take 100 mg by mouth daily.    [provider]  aspirin EC 81 MG tablet Take 1 tablet (81 mg total) by mouth daily. 04/20/20   Algernon Huxley, MD  atorvastatin (LIPITOR) 10 MG tablet Take 1 tablet by mouth daily. 11/23/17   [provider]  clonazePAM (KLONOPIN) 0.5 MG tablet Take 1 tablet (0.5 mg total) by mouth as needed. Patient taking differently: Take 0.5 mg by mouth 2 (two) times daily. 07/26/18   Bettey Costa, MD  clopidogrel (PLAVIX) 75 MG tablet Take 1 tablet (75 mg total) by mouth daily. 04/02/18   Algernon Huxley, MD  folic acid-vitamin b complex-vitamin c-selenium-zinc (DIALYVITE) 3 MG TABS tablet Take 1 tablet by mouth daily.     [provider]  gabapentin (NEURONTIN) 100 MG capsule Take 2 capsules (200 mg total) by mouth 2 (two) times daily. After dialysis session 06/28/20   Enzo Bi, MD  hydrALAZINE (APRESOLINE) 25 MG tablet Hold  until followup with outpatient doctor due to intermittently low blood pressure. 06/28/20   Enzo Bi, MD  losartan (COZAAR) 100 MG tablet Hold until followup with outpatient doctor due to intermittently low blood pressure. 06/28/20   Enzo Bi, MD  metoprolol tartrate (LOPRESSOR) 25 MG tablet Take 25 mg by mouth every evening.    [provider]  oxyCODONE (ROXICODONE) 5 MG immediate release tablet Take 1-2 tablets (5-10 mg total) by mouth every 6 (six) hours as needed for severe pain (No more than 6 tabs daily). 05/26/20 05/26/21  Duffy Bruce, MD  sevelamer carbonate (RENVELA) 800 MG tablet Take 2,400 mg by mouth 3 (three) times daily with meals.  09/20/18   [provider]     Allergies Shellfish allergy and Other   Family History  Problem Relation Age of Onset  . Hypertension Other   . Heart disease Other   . Diabetes Mother     Social History Social History   Tobacco Use  . Smoking status: Former Smoker    Types: Cigarettes    Quit date: 05/17/2005  Years since quitting: 15.2  . Smokeless tobacco: Never Used  Vaping Use  . Vaping Use: Never used  Substance Use Topics  . Alcohol use: No  . Drug use: No    Review of Systems  Constitutional:   No fever or chills.  ENT:   No sore throat. No rhinorrhea. Cardiovascular:   No chest pain or syncope. Respiratory:   No dyspnea or cough. Gastrointestinal:   Negative for abdominal pain, vomiting and diarrhea.  Musculoskeletal:   Negative for focal pain or swelling All other systems reviewed and are negative except as documented above in ROS and HPI.  ____________________________________________   PHYSICAL EXAM:  VITAL SIGNS: ED Triage Vitals  Enc Vitals Group     BP 07/30/20 1120 (!) 191/93     Pulse Rate 07/30/20 1120 81     Resp 07/30/20 1120 18     Temp 07/30/20 1120 98 F (36.7 C)     Temp Source 07/30/20 1120 Oral     SpO2 07/30/20 1120 97 %     Weight 07/30/20 1120 169 lb (76.7 kg)      Height 07/30/20 1120 '5\' 8"'$  (1.727 m)     Head Circumference --      Peak Flow --      Pain Score 07/30/20 1112 0     Pain Loc --      Pain Edu? --      Excl. in [place]? --     Vital signs reviewed, nursing assessments reviewed.   Constitutional:   Alert and oriented. Non-toxic appearance. Eyes:   Conjunctivae are normal. EOMI. PERRL. ENT      Head:   Normocephalic and atraumatic.      Nose:   Wearing a mask.      Mouth/Throat:   Wearing a mask.      Neck:   No meningismus. Full ROM. Hematological/Lymphatic/Immunilogical:   No cervical lymphadenopathy. Cardiovascular:   RRR. Symmetric bilateral radial and DP pulses.  No murmurs. Cap refill less than 2 seconds.  Left femoral catheter slightly withdrawn but overall in position, no tenderness inflammation or bleeding at the site.   Respiratory:   Normal respiratory effort without tachypnea/retractions. Breath sounds are clear and equal bilaterally. No wheezes/rales/rhonchi. Gastrointestinal:   Soft and nontender. Non distended. There is no CVA tenderness.  No rebound, rigidity, or guarding.  Musculoskeletal:   Normal range of motion in all extremities. No joint effusions.  No lower extremity tenderness.  No edema. Neurologic:   Normal speech and language.  Motor grossly intact. No acute focal neurologic deficits are appreciated.  Skin:    Skin is warm, dry and intact. No rash noted.  No petechiae, purpura, or bullae.  ____________________________________________    LABS (pertinent positives/negatives) (all labs ordered are listed, but only abnormal results are displayed) Labs Reviewed  CBC - Abnormal; Notable for the following components:      Result Value   RBC 3.61 (*)    Hemoglobin 11.3 (*)    HCT 36.7 (*)    MCV 101.7 (*)    Platelets 149 (*)    All other components within normal limits  BASIC METABOLIC PANEL - Abnormal; Notable for the following components:   Potassium 5.4 (*)    BUN 48 (*)    Creatinine, Ser 10.86  (*)    GFR, Estimated 5 (*)    All other components within normal limits   ____________________________________________   EKG    ____________________________________________    RADIOLOGY  No results found.  ____________________________________________   PROCEDURES Procedures  ____________________________________________    CLINICAL IMPRESSION / ASSESSMENT AND PLAN / ED COURSE  Medications ordered in the ED: Medications - No data to display  Pertinent labs & imaging results that were available during my care of the patient were reviewed by me and considered in my medical decision making (see chart for details).  Jaicion Cahn. was evaluated in Emergency Department on 07/30/2020 for the symptoms described in the history of present illness. He was evaluated in the context of the global COVID-19 pandemic, which necessitated consideration that the patient might be at risk for infection with the SARS-CoV-2 virus that causes COVID-19. Institutional protocols and algorithms that pertain to the evaluation of patients at risk for COVID-19 are in a state of rapid change based on information released by regulatory bodies including the CDC and federal and state organizations. These policies and algorithms were followed during the patient's care in the ED.   Patient presents with slightly dislodged left femoral dialysis catheter, withdrawn approximately 5 cm.  No evidence of infection, no bleeding.  Pelvis x-ray viewed and interpreted by me and does show that the catheter is following the expected course of the iIliac vein into the infrarenal IVC.  Presentation discussed with vascular surgery team, Dr. Laurence Compton will evaluate after his current procedure.   Clinical Course as of 07/30/20 1457  Thu Jul 30, 2020  1457 Patient being transported to vascular lab now for evaluation of his catheter.  Patient's routine dialysis schedule is Monday Wednesday Friday, has standing appointment for  tomorrow that he can go to once catheter function is restored. [PS]    Clinical Course User Index [PS] Carrie Mew, MD     ____________________________________________   FINAL CLINICAL IMPRESSION(S) / ED DIAGNOSES    Final diagnoses:  Displacement of vascular dialysis catheter, initial encounter [facility])  Acquired absence of other left toe(s) ([place])  Type 2 diabetes mellitus with other specified complication, unspecified whether long term insulin use ([place])  ESRD on hemodialysis [facility])     ED Discharge Orders    None      Portions of this note were generated with dragon dictation software. Dictation errors may occur despite best attempts at proofreading.   Carrie Mew, MD 07/30/20 [phone]

## 2020-07-30 NOTE — Interval H&P Note (Signed)
History and Physical Interval Note:  07/30/2020 3:17 PM  Johnny [name].  has presented today for surgery, with the diagnosis of ESRD and a PermCath with an exposed cuff that needs to be exchanged.  The various methods of treatment have been discussed with the patient and family. After consideration of risks, benefits and other options for treatment, the patient has consented to  Procedure(s): DIALYSIS/PERMA CATHETER EXCHANGE (N/A) as a surgical intervention.  The patient's history has been reviewed, patient examined, no change in status, stable for surgery.  I have reviewed the patient's chart and labs.  Questions were answered to the patient's satisfaction.     Leotis Pain

## 2020-07-30 NOTE — ED Notes (Signed)
Pt difficult stick.  Lab called

## 2020-07-30 NOTE — Op Note (Signed)
OPERATIVE NOTE    PRE-OPERATIVE DIAGNOSIS: 1. ESRD 2. Non-functional permcath with exposed cuff  POST-OPERATIVE DIAGNOSIS: same as above  PROCEDURE: 1. Placement of a 44 cm tip to cuff tunneled hemodialysis catheter via the left femoral vein and removal of previous catheter  SURGEON: Leotis Pain, MD  ANESTHESIA:  Local with moderate conscious sedation for 12 minutes using 2 mg of Versed   ESTIMATED BLOOD LOSS: 3 cc  FINDING(S): none  SPECIMEN(S):  None  INDICATIONS:   Patient is a 70 y.o.male who presents with non-functional dialysis catheter with an exposed cuff and ESRD.  The patient needs long term dialysis access for their ESRD, and a Permcath is necessary.  Risks and benefits are discussed and informed consent is obtained.    DESCRIPTION: After obtaining full informed written consent, the patient was brought back to the vascular suite. The patient received moderate conscious sedation during a face-to-face encounter with me present throughout the entire procedure and supervising the RN monitoring the vital signs, pulse oximetry, telemetry, and mental status throughout the entire procedure. The patient's existing catheter, left thigh and groin were sterilely prepped and draped in a sterile surgical field was created.  The existing catheter was dissected free from the fibrous sheath securing the cuff with hemostats and blunt dissection.  A wire was placed. The existing catheter was then removed and the wire used to keep venous access. I selected a 44 cm tip to cuff tunneled dialysis catheter.  The appropriate distal connectors were placed. It withdrew blood well and flushed easily with heparinized saline and a concentrated heparin solution was then placed. It was secured to the skin with 2 Prolene sutures. A 4-0 Monocryl pursestring suture was placed around the exit site. Sterile dressings were placed.  Stat x-ray is pending. The patient tolerated the procedure well and was taken to the  recovery room in stable condition.  COMPLICATIONS: None  CONDITION: Stable  Leotis Pain 07/30/2020 3:55 PM   This note was created with Dragon Medical transcription system. Any errors in dictation are purely unintentional.

## 2020-07-30 NOTE — ED Triage Notes (Signed)
Patient arrived via EMS due to consistent bleeding from dialysis catheter in the left upper thigh. Pt was here this morning for issues to the same catheter and when he went home it began bleeding at the site.

## 2020-07-30 NOTE — ED Triage Notes (Signed)
Pt comes into the ED via EMS from home, states he has a dialysis catheter in the left upper thigh , states he has not been in the proper position since Monday and when he went to dialysis today they would not perform dialysis. No bleeding, pain or other complaints at this time.

## 2020-07-30 NOTE — Discharge Instructions (Signed)
Return to the emergency department for any significant bleeding or any other symptom personally concerning to yourself.

## 2020-07-31 ENCOUNTER — Encounter: Payer: Self-pay | Admitting: Vascular Surgery

## 2020-08-19 ENCOUNTER — Telehealth (INDEPENDENT_AMBULATORY_CARE_PROVIDER_SITE_OTHER): Payer: Self-pay | Admitting: Nurse Practitioner

## 2020-08-19 NOTE — Telephone Encounter (Signed)
Johnny [name] called in wanting the provider staff know that patient needed to get fistulagram moved to the right side of his neck. The dialysis center could not get it to dialyzed.  Patient was seen in  [facility].

## 2020-08-21 ENCOUNTER — Emergency Department
Admission: EM | Admit: 2020-08-21 | Discharge: 2020-08-21 | Disposition: A | Discharge status: Home / Self Care | Payer: Medicare Other | Attending: Emergency Medicine | Admitting: Emergency Medicine

## 2020-08-21 ENCOUNTER — Other Ambulatory Visit: Payer: Self-pay

## 2020-08-21 DIAGNOSIS — E1140 Type 2 diabetes mellitus with diabetic neuropathy, unspecified: Secondary | ICD-10-CM | POA: Insufficient documentation

## 2020-08-21 DIAGNOSIS — N186 End stage renal disease: Secondary | ICD-10-CM | POA: Diagnosis not present

## 2020-08-21 DIAGNOSIS — T82838A Hemorrhage of vascular prosthetic devices, implants and grafts, initial encounter: Secondary | ICD-10-CM | POA: Diagnosis not present

## 2020-08-21 DIAGNOSIS — E1151 Type 2 diabetes mellitus with diabetic peripheral angiopathy without gangrene: Secondary | ICD-10-CM | POA: Diagnosis not present

## 2020-08-21 DIAGNOSIS — I5032 Chronic diastolic (congestive) heart failure: Secondary | ICD-10-CM | POA: Diagnosis not present

## 2020-08-21 DIAGNOSIS — Z7902 Long term (current) use of antithrombotics/antiplatelets: Secondary | ICD-10-CM | POA: Diagnosis not present

## 2020-08-21 DIAGNOSIS — Z992 Dependence on renal dialysis: Secondary | ICD-10-CM | POA: Diagnosis not present

## 2020-08-21 DIAGNOSIS — Z8616 Personal history of COVID-19: Secondary | ICD-10-CM | POA: Insufficient documentation

## 2020-08-21 DIAGNOSIS — Z87891 Personal history of nicotine dependence: Secondary | ICD-10-CM | POA: Diagnosis not present

## 2020-08-21 DIAGNOSIS — I2510 Atherosclerotic heart disease of native coronary artery without angina pectoris: Secondary | ICD-10-CM | POA: Diagnosis not present

## 2020-08-21 DIAGNOSIS — Z7982 Long term (current) use of aspirin: Secondary | ICD-10-CM | POA: Insufficient documentation

## 2020-08-21 DIAGNOSIS — I132 Hypertensive heart and chronic kidney disease with heart failure and with stage 5 chronic kidney disease, or end stage renal disease: Secondary | ICD-10-CM | POA: Insufficient documentation

## 2020-08-21 DIAGNOSIS — Z79899 Other long term (current) drug therapy: Secondary | ICD-10-CM | POA: Diagnosis not present

## 2020-08-21 DIAGNOSIS — Y846 Urinary catheterization as the cause of abnormal reaction of the patient, or of later complication, without mention of misadventure at the time of the procedure: Secondary | ICD-10-CM | POA: Insufficient documentation

## 2020-08-21 LAB — CBC WITH DIFFERENTIAL/PLATELET
Abs Immature Granulocytes: 0.02 10*3/uL (ref 0.00–0.07)
Basophils Absolute: 0.1 10*3/uL (ref 0.0–0.1)
Basophils Relative: 1 %
Eosinophils Absolute: 0.4 10*3/uL (ref 0.0–0.5)
Eosinophils Relative: 4 %
HCT: 31.8 % — ABNORMAL LOW (ref 39.0–52.0)
Hemoglobin: 10 g/dL — ABNORMAL LOW (ref 13.0–17.0)
Immature Granulocytes: 0 %
Lymphocytes Relative: 24 %
Lymphs Abs: 2.1 10*3/uL (ref 0.7–4.0)
MCH: 31 pg (ref 26.0–34.0)
MCHC: 31.4 g/dL (ref 30.0–36.0)
MCV: 98.5 fL (ref 80.0–100.0)
Monocytes Absolute: 0.9 10*3/uL (ref 0.1–1.0)
Monocytes Relative: 10 %
Neutro Abs: 5.2 10*3/uL (ref 1.7–7.7)
Neutrophils Relative %: 61 %
Platelets: 172 10*3/uL (ref 150–400)
RBC: 3.23 MIL/uL — ABNORMAL LOW (ref 4.22–5.81)
RDW: 14.8 % (ref 11.5–15.5)
WBC: 8.6 10*3/uL (ref 4.0–10.5)
nRBC: 0 % (ref 0.0–0.2)

## 2020-08-21 LAB — BASIC METABOLIC PANEL
Anion gap: 12 (ref 5–15)
BUN: 41 mg/dL — ABNORMAL HIGH (ref 8–23)
CO2: 23 mmol/L (ref 22–32)
Calcium: 8.1 mg/dL — ABNORMAL LOW (ref 8.9–10.3)
Chloride: 105 mmol/L (ref 98–111)
Creatinine, Ser: 10.24 mg/dL — ABNORMAL HIGH (ref 0.61–1.24)
GFR, Estimated: 5 mL/min — ABNORMAL LOW (ref >60)
Glucose, Bld: 79 mg/dL (ref 70–99)
Potassium: 4.7 mmol/L (ref 3.5–5.1)
Sodium: 140 mmol/L (ref 135–145)

## 2020-08-21 LAB — APTT: aPTT: 31 seconds (ref 24–36)

## 2020-08-21 LAB — PROTIME-INR
INR: 1.1 (ref 0.8–1.2)
Prothrombin Time: 14.2 seconds (ref 11.4–15.2)

## 2020-08-21 MED ORDER — TRANEXAMIC ACID FOR EPISTAXIS
500.0000 mg | Freq: Once | TOPICAL | Status: AC
  Administered 2020-08-21: 500 mg via TOPICAL
  Filled 2020-08-21: qty 10

## 2020-08-21 NOTE — ED Notes (Signed)
medication placed on right gauze around dialysis port, covered with tegaderm.

## 2020-08-21 NOTE — ED Notes (Addendum)
[facility] states if pt given taxi voucher their staff will meet taxi upon arrival and assist pt out into wheel chair. Taxi voucher given to pt and taxi company contacted by this Therapist, [employer]. Taxi will be picking up pt to transport from [facility] to [place] at [address] [place]. Pt d/c'ed and transported to lobby, awaiting taxi arrival.

## 2020-08-21 NOTE — ED Provider Notes (Addendum)
[facility] Emergency Department Provider Note  ____________________________________________   Event Date/Time   First MD Initiated Contact with Patient 08/21/20 [phone]     (approximate)  I have reviewed the triage vital signs and the nursing notes.   HISTORY  Chief Complaint bleeding around dialysis port.     HPI Johnny Igou. is a 70 y.o. male with history of end-stage renal disease on hemodialysis Monday, Wednesday and Friday who presents to the emergency department with EMS from his dialysis center after the technician at the dialysis center noticed there was bleeding around his right tunneled IJ catheter that was placed at [facility] on 08/18/2020.  He states he did not notice any bleeding previous to this.  He denies being on blood thinners other than heparin that he receives at dialysis.  He denies any complaints.  He states he has had other catheters bleed in the past.        Past Medical History:  Diagnosis Date  . Anemia   . Anxiety   . CHF (congestive heart failure) ([place])   . Chronic kidney disease    esrd dialysis m/w/f  . Gout   . Hyperlipidemia   . Hypertension   . Myocardial infarction ([place]) 2010   10 years ago  . 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  . Peripheral vascular disease [facility])     Patient Active Problem List   Diagnosis Date Noted  . Acquired absence of other left toe(s) ([place]) 07/30/2020  . Type 2 diabetes mellitus with other specified complication, unspecified whether long term insulin use ([place]) 07/30/2020  . Dyspnea 06/28/2020  . COVID-19 virus infection 06/25/2020  . Hypotension of hemodialysis 06/25/2020  . Hypotension 06/25/2020  . Cardiomyopathy ([place]) 01/22/2019  . CHF (congestive heart failure) ([place]) 01/22/2019  . Coronary disease 01/22/2019  . 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 Leg ulcer, right, with fat layer exposed ([place]) 10/22/2018  .  Cellulitis of right leg 07/21/2018  . Lower limb ulcer, calf, right, limited to breakdown of skin ([place]) 06/08/2018  . End-stage renal disease ([place]) 05/09/2018  . PVD (peripheral vascular disease) ([place]) 04/17/2018  . Malnutrition of moderate degree 04/11/2018  . Pressure injury of skin 04/06/2018  . Altered mental status 04/04/2018  . Hypothermia 04/04/2018  . Hemodialysis graft malfunction ([place]) 03/26/2018  . Hypercholesterolemia 02/27/2018  . Diabetes ([place]) 02/27/2018  . Weakness of right lower extremity 01/20/2018  . Fever   . Periumbilical abdominal pain   . Confusion 12/22/2017  . Acute delirium 12/21/2017  . Protein-calorie malnutrition, severe 12/19/2017  . Intractable nausea and vomiting 12/18/2017  . Lymphedema 12/13/2017  . Cellulitis 11/27/2017  . Chest pain 11/19/2017  . Atherosclerosis of native arteries of the extremities with ulceration ([place]) 11/07/2017  . Twitching 01/03/2017  . Elevated troponin 10/02/2015  . Complications, dialysis, catheter, mechanical ([place]) 10/02/2015  . Musculoskeletal chest pain 09/28/2015  . Chronic diastolic CHF (congestive heart failure) ([place]) 09/28/2015  . ESRD on hemodialysis ([place]) 10/09/2012  . Hypertension 10/09/2012  . Gout 10/09/2012    Past Surgical History:  Procedure Laterality Date  . A/V FISTULAGRAM Right 09/06/2018   Procedure: A/V FISTULAGRAM;  Surgeon: Algernon Huxley, MD;  Location: [place] [facility];  Service: Cardiovascular;  Laterality: Right;  . A/V SHUNTOGRAM Left 06/21/2017   Procedure: A/V SHUNTOGRAM;  Surgeon: Katha Cabal, MD;  Location: [place] [facility];  Service: Cardiovascular;  Laterality: Left;  .  A/V SHUNTOGRAM N/A 10/24/2018   Procedure: A/V SHUNTOGRAM;  Surgeon: Algernon Huxley, MD;  Location: [place] [facility];  Service: Cardiovascular;  Laterality: N/A;  . ABOVE KNEE LEG AMPUTATION Right 2020  . AMPUTATION Right 10/25/2018   Procedure: AMPUTATION ABOVE KNEE;  Surgeon: Algernon Huxley, MD;   Location: [facility];  Service: General;  Laterality: Right;  . APPLICATION OF WOUND VAC Right 04/11/2018   Procedure: APPLICATION OF WOUND VAC;  Surgeon: Algernon Huxley, MD;  Location: [facility];  Service: Vascular;  Laterality: Right;  . AV FISTULA PLACEMENT Left 09/18/2015   Procedure: INSERTION OF ARTERIOVENOUS (AV) GORE-TEX GRAFT ARM ( BRACH/AXILLARY GRAFT W/ INSTANT STICK GRAFT );  Surgeon: Katha Cabal, MD;  Location: [facility];  Service: Vascular;  Laterality: Left;  . AV FISTULA PLACEMENT Right 07/19/2018   Procedure: INSERTION OF ARTERIOVENOUS (AV) GORE-TEX GRAFT ARM ( BRACHIAL AXILLARY);  Surgeon: Algernon Huxley, MD;  Location: [facility];  Service: Vascular;  Laterality: Right;  . DIALYSIS FISTULA CREATION Right 10/2017   right chest perm cath  . DIALYSIS/PERMA CATHETER INSERTION N/A 06/09/2020   Procedure: DIALYSIS/PERMA CATHETER INSERTION;  Surgeon: Algernon Huxley, MD;  Location: [place] [facility];  Service: Cardiovascular;  Laterality: N/A;  . DIALYSIS/PERMA CATHETER INSERTION N/A 07/30/2020   Procedure: DIALYSIS/PERMA CATHETER EXCHANGE;  Surgeon: Algernon Huxley, MD;  Location: [place] [facility];  Service: Cardiovascular;  Laterality: N/A;  . DIALYSIS/PERMA CATHETER REMOVAL N/A 09/13/2018   Procedure: DIALYSIS/PERMA CATHETER REMOVAL;  Surgeon: Algernon Huxley, MD;  Location: [place] [facility];  Service: Cardiovascular;  Laterality: N/A;  . ESOPHAGOGASTRODUODENOSCOPY N/A 12/19/2017   Procedure: ESOPHAGOGASTRODUODENOSCOPY (EGD);  Surgeon: Lin Landsman, MD;  Location: [facility];  Service: Gastroenterology;  Laterality: N/A;  . LOWER EXTREMITY ANGIOGRAPHY Left 11/16/2017   Procedure: LOWER EXTREMITY ANGIOGRAPHY;  Surgeon: Algernon Huxley, MD;  Location: [place] [facility];  Service: Cardiovascular;  Laterality: Left;  . LOWER EXTREMITY ANGIOGRAPHY Right 01/18/2018   Procedure: LOWER EXTREMITY ANGIOGRAPHY;  Surgeon: Algernon Huxley, MD;  Location: [place] [facility];  Service:  Cardiovascular;  Laterality: Right;  . LOWER EXTREMITY ANGIOGRAPHY Left 04/02/2018   Procedure: LOWER EXTREMITY ANGIOGRAPHY;  Surgeon: Algernon Huxley, MD;  Location: [place] [facility];  Service: Cardiovascular;  Laterality: Left;  . LOWER EXTREMITY ANGIOGRAPHY Right 04/09/2018   Procedure: Lower Extremity Angiography with possible intervention;  Surgeon: Algernon Huxley, MD;  Location: [place] [facility];  Service: Cardiovascular;  Laterality: Right;  . LOWER EXTREMITY ANGIOGRAPHY Right 07/23/2018   Procedure: Lower Extremity Angiography;  Surgeon: Algernon Huxley, MD;  Location: [place] [facility];  Service: Cardiovascular;  Laterality: Right;  . LOWER EXTREMITY ANGIOGRAPHY Right 09/13/2018   Procedure: LOWER EXTREMITY ANGIOGRAPHY;  Surgeon: Algernon Huxley, MD;  Location: [place] [facility];  Service: Cardiovascular;  Laterality: Right;  . LOWER EXTREMITY VENOGRAPHY Right 09/13/2018   Procedure: LOWER EXTREMITY VENOGRAPHY;  Surgeon: Algernon Huxley, MD;  Location: [place] [facility];  Service: Cardiovascular;  Laterality: Right;  . PERIPHERAL VASCULAR CATHETERIZATION Left 09/01/2015   Procedure: A/V Shuntogram/Fistulagram;  Surgeon: Katha Cabal, MD;  Location: [place] [facility];  Service: Cardiovascular;  Laterality: Left;  . PERIPHERAL VASCULAR CATHETERIZATION N/A 09/30/2015   Procedure: A/V Shuntogram/Fistulagram with perm cathether removal;  Surgeon: Algernon Huxley, MD;  Location: [place] [facility];  Service: Cardiovascular;  Laterality: N/A;  . PERIPHERAL VASCULAR CATHETERIZATION Left 09/30/2015   Procedure: A/V Shunt  Intervention;  Surgeon: Algernon Huxley, MD;  Location: [place] [facility];  Service: Cardiovascular;  Laterality: Left;  . PERIPHERAL VASCULAR CATHETERIZATION Left 12/03/2015   Procedure: Thrombectomy;  Surgeon: Algernon Huxley, MD;  Location: [place] [facility];  Service: Cardiovascular;  Laterality: Left;  . PERIPHERAL VASCULAR CATHETERIZATION Left 01/28/2016   Procedure:  Thrombectomy;  Surgeon: Algernon Huxley, MD;  Location: [place] [facility];  Service: Cardiovascular;  Laterality: Left;  . PERIPHERAL VASCULAR CATHETERIZATION N/A 01/28/2016   Procedure: A/V Shuntogram/Fistulagram;  Surgeon: Algernon Huxley, MD;  Location: [place] [facility];  Service: Cardiovascular;  Laterality: N/A;  . SKIN SPLIT GRAFT Right 05/24/2018   Procedure: SKIN GRAFT SPLIT THICKNESS ( RIGHT CALF);  Surgeon: Algernon Huxley, MD;  Location: [facility];  Service: Vascular;  Laterality: Right;  . UPPER EXTREMITY ANGIOGRAPHY  10/24/2018   Procedure: Upper Extremity Angiography;  Surgeon: Algernon Huxley, MD;  Location: [place] [facility];  Service: Cardiovascular;;  . UPPER EXTREMITY ANGIOGRAPHY Right 02/14/2019   Procedure: UPPER EXTREMITY ANGIOGRAPHY;  Surgeon: Algernon Huxley, MD;  Location: [place] [facility];  Service: Cardiovascular;  Laterality: Right;  . UPPER EXTREMITY ANGIOGRAPHY Right 04/20/2020   Procedure: UPPER EXTREMITY ANGIOGRAPHY;  Surgeon: Algernon Huxley, MD;  Location: [place] [facility];  Service: Cardiovascular;  Laterality: Right;  . WOUND DEBRIDEMENT Right 04/11/2018   Procedure: DEBRIDEMENT WOUND calf muscle and skin;  Surgeon: Algernon Huxley, MD;  Location: [facility];  Service: Vascular;  Laterality: Right;    Prior to Admission medications   Medication Sig Start Date End Date Taking? Authorizing Provider  allopurinol (ZYLOPRIM) 100 MG tablet Take 100 mg by mouth daily.    [provider]  aspirin EC 81 MG tablet Take 1 tablet (81 mg total) by mouth daily. 04/20/20   Algernon Huxley, MD  atorvastatin (LIPITOR) 10 MG tablet Take 1 tablet by mouth daily. 11/23/17   [provider]  clonazePAM (KLONOPIN) 0.5 MG tablet Take 1 tablet (0.5 mg total) by mouth as needed. Patient taking differently: Take 0.5 mg by mouth 2 (two) times daily. 07/26/18   Bettey Costa, MD  clopidogrel (PLAVIX) 75 MG tablet Take 1 tablet (75 mg total) by mouth daily. 04/02/18   Algernon Huxley, MD   folic acid-vitamin b complex-vitamin c-selenium-zinc (DIALYVITE) 3 MG TABS tablet Take 1 tablet by mouth daily.     [provider]  gabapentin (NEURONTIN) 100 MG capsule Take 2 capsules (200 mg total) by mouth 2 (two) times daily. After dialysis session 06/28/20   Enzo Bi, MD  hydrALAZINE (APRESOLINE) 25 MG tablet Hold until followup with outpatient doctor due to intermittently low blood pressure. 06/28/20   Enzo Bi, MD  losartan (COZAAR) 100 MG tablet Hold until followup with outpatient doctor due to intermittently low blood pressure. 06/28/20   Enzo Bi, MD  metoprolol tartrate (LOPRESSOR) 25 MG tablet Take 25 mg by mouth every evening.    [provider]  oxyCODONE (ROXICODONE) 5 MG immediate release tablet Take 1-2 tablets (5-10 mg total) by mouth every 6 (six) hours as needed for severe pain (No more than 6 tabs daily). 05/26/20 05/26/21  Duffy Bruce, MD  sevelamer carbonate (RENVELA) 800 MG tablet Take 2,400 mg by mouth 3 (three) times daily with meals.  09/20/18   [provider]    Allergies Shellfish allergy and Other  Family History  Problem Relation Age of Onset  . Hypertension Other   .  Heart disease Other   . Diabetes Mother     Social History Social History   Tobacco Use  . Smoking status: Former Smoker    Types: Cigarettes    Quit date: 05/17/2005    Years since quitting: 15.2  . Smokeless tobacco: Never Used  Vaping Use  . Vaping Use: Never used  Substance Use Topics  . Alcohol use: No  . Drug use: No    Review of Systems Constitutional: No fever. Eyes: No visual changes. ENT: No sore throat. Cardiovascular: Denies chest pain. Respiratory: Denies shortness of breath. Gastrointestinal: No nausea, vomiting, diarrhea. Genitourinary: Negative for dysuria. Musculoskeletal: Negative for back pain. Skin: Negative for rash. Neurological: Negative for focal weakness or  numbness.  ____________________________________________   PHYSICAL EXAM:  VITAL SIGNS: ED Triage Vitals  Enc Vitals Group     BP 08/21/20 0627 (!) 157/84     Pulse Rate 08/21/20 0627 86     Resp 08/21/20 0627 16     Temp 08/21/20 0627 98.4 F (36.9 C)     Temp Source 08/21/20 0627 Oral     SpO2 08/21/20 0627 99 %     Weight 08/21/20 0628 165 lb (74.8 kg)     Height 08/21/20 0628 '5\' 8"'$  (1.727 m)     Head Circumference --      Peak Flow --      Pain Score 08/21/20 0628 0     Pain Loc --      Pain Edu? --      Excl. in [place]? --    CONSTITUTIONAL: Alert and oriented and responds appropriately to questions. Well-appearing; well-nourished HEAD: Normocephalic EYES: Conjunctivae clear, pupils appear equal, EOM appear intact ENT: normal nose; moist mucous membranes NECK: Supple, normal ROM CARD: RRR; S1 and S2 appreciated; no murmurs, no clicks, no rubs, no gallops CHEST: Right tunneled IJ catheter placed in the right chest wall with no bleeding, redness, warmth.  He does have a small amount of ecchymosis and minimal tenderness.  Sutures intact.  No displacement of the catheter. RESP: Normal chest excursion without splinting or tachypnea; breath sounds clear and equal bilaterally; no wheezes, no rhonchi, no rales, no hypoxia or respiratory distress, speaking full sentences ABD/GI: Normal bowel sounds; non-distended; soft, non-tender, no rebound, no guarding, no peritoneal signs, no hepatosplenomegaly BACK: The back appears normal EXT: Normal ROM in all joints; no deformity noted, no edema; no cyanosis SKIN: Normal color for age and race; warm; no rash on exposed skin NEURO: Moves all extremities equally PSYCH: The patient's mood and manner are appropriate.  ____________________________________________   LABS (all labs ordered are listed, but only abnormal results are displayed)  Labs Reviewed  CBC WITH DIFFERENTIAL/PLATELET  BASIC METABOLIC PANEL  PROTIME-INR  APTT    ____________________________________________  EKG  none ____________________________________________  RADIOLOGY I, Ashwika Freels, personally viewed and evaluated these images (plain radiographs) as part of my medical decision making, as well as reviewing the written report by the radiologist.  ED MD interpretation:  none  Official radiology report(s): No results found.  ____________________________________________   PROCEDURES  Procedure(s) performed (including Critical Care):  Procedures  CRITICAL CARE Performed by: Pryor Curia   ____________________________________________   INITIAL IMPRESSION / ASSESSMENT AND PLAN / ED COURSE  As part of my medical decision making, I reviewed the following data within the [place] notes reviewed and incorporated, Labs reviewed , Old chart reviewed and Notes from prior ED visits  Patient here with oozing blood around his catheter.  Bleeding has now completely stopped.  States this area is slightly sore but no active bleeding, signs of infection.  It does not look like it has displaced or dislodged at all.  Will place TXA soaked gauze around this area and a dressing and monitor for any signs of bleeding.  Will check basic labs to ensure no thrombocytopenia, coagulopathy contributing to his symptoms.  States he has had this happen with other catheters in the past.  It appears he was seen in March for bleeding around a left tunneled femoral catheter.  ED PROGRESS  Signed out to oncoming ED physician, Dr. Cheri Fowler, to follow-up on patient's labs and reassess for any signs of bleeding.  If no recurrent bleeding and labs reassuring, anticipate discharge home and encourage patient to go to dialysis today.  I reviewed all nursing notes and pertinent previous records as available.  I have reviewed and interpreted any EKGs, lab and urine results, imaging (as  available).  ____________________________________________   FINAL CLINICAL IMPRESSION(S) / ED DIAGNOSES  Final diagnoses:  Bleeding due to dialysis catheter placement, initial encounter [facility])     ED Discharge Orders    None      *Please note:  Johnny [name]. was evaluated in Emergency Department on 08/21/2020 for the symptoms described in the history of present illness. He was evaluated in the context of the global COVID-19 pandemic, which necessitated consideration that the patient might be at risk for infection with the SARS-CoV-2 virus that causes COVID-19. Institutional protocols and algorithms that pertain to the evaluation of patients at risk for COVID-19 are in a state of rapid change based on information released by regulatory bodies including the CDC and federal and state organizations. These policies and algorithms were followed during the patient's care in the ED.  Some ED evaluations and interventions may be delayed as a result of limited staffing during and the pandemic.*   Note:  This document was prepared using Dragon voice recognition software and may include unintentional dictation errors.      Naylani Bradner, Delice Bison, DO 08/21/20 [phone]

## 2020-08-21 NOTE — ED Notes (Signed)
[facility] contacted to arrange transport back to their facility. [facility] tech asked RN to contact them back after 9 to speak to administration to arrange transport.

## 2020-08-21 NOTE — ED Triage Notes (Addendum)
pt arrived to ed via ems from dialysis. pt did not receive any portion of dialysis. ems states they were called for hemorrhage  from around diaysis port.  Port recently placed 4/12. pt denies pain, swelling or nausea and vomiting. no bleeding noted on arrival. MD present to assess

## 2020-08-21 NOTE — ED Notes (Signed)
Lab contacted to draw blood work. RN made 3 attempts to collect without success

## 2020-09-21 ENCOUNTER — Other Ambulatory Visit (INDEPENDENT_AMBULATORY_CARE_PROVIDER_SITE_OTHER): Payer: Self-pay | Admitting: Nurse Practitioner

## 2020-09-21 DIAGNOSIS — I7025 Atherosclerosis of native arteries of other extremities with ulceration: Secondary | ICD-10-CM

## 2020-09-22 ENCOUNTER — Ambulatory Visit (INDEPENDENT_AMBULATORY_CARE_PROVIDER_SITE_OTHER): Payer: Medicare Other

## 2020-09-22 ENCOUNTER — Encounter (INDEPENDENT_AMBULATORY_CARE_PROVIDER_SITE_OTHER): Payer: Self-pay | Admitting: Nurse Practitioner

## 2020-09-22 ENCOUNTER — Ambulatory Visit (INDEPENDENT_AMBULATORY_CARE_PROVIDER_SITE_OTHER): Payer: Medicare Other | Admitting: Nurse Practitioner

## 2020-09-22 ENCOUNTER — Other Ambulatory Visit: Payer: Self-pay

## 2020-09-22 VITALS — BP 189/104 | HR 69 | Resp 16

## 2020-09-22 DIAGNOSIS — N186 End stage renal disease: Secondary | ICD-10-CM | POA: Diagnosis not present

## 2020-09-22 DIAGNOSIS — I10 Essential (primary) hypertension: Secondary | ICD-10-CM | POA: Diagnosis not present

## 2020-09-22 DIAGNOSIS — I7025 Atherosclerosis of native arteries of other extremities with ulceration: Secondary | ICD-10-CM | POA: Diagnosis not present

## 2020-09-22 DIAGNOSIS — E785 Hyperlipidemia, unspecified: Secondary | ICD-10-CM | POA: Diagnosis not present

## 2020-09-28 ENCOUNTER — Encounter (INDEPENDENT_AMBULATORY_CARE_PROVIDER_SITE_OTHER): Payer: Self-pay | Admitting: Nurse Practitioner

## 2020-09-28 NOTE — Progress Notes (Signed)
Subjective:    Patient ID: Johnny [name]., male    DOB: 26-Aug-1950, 70 y.o.   MRN: [id number] Chief Complaint  Patient presents with  . Follow-up    2 month ultrasound follow up    The patient returns to the office for followup and review of the noninvasive studies. There have been no interval changes in lower extremity symptoms. No interval shortening of the patient's claudication distance or development of rest pain symptoms. No new ulcers or wounds have occurred since the last visit.  There have been no significant changes to the patient's overall health care.  The patient denies amaurosis fugax or recent TIA symptoms. There are no recent neurological changes noted. The patient denies history of DVT, PE or superficial thrombophlebitis. The patient denies recent episodes of angina or shortness of breath.   ABI Rt=aka and Lt=[place]  (previous ABI's Rt=aka and Lt=[place]) Duplex ultrasound of the left tibial arteries show monophasic waveforms.  A left lower extremity arterial duplex shows monophasic waveforms in the common femoral as well as deep femoral arteries.  Biphasic waveforms from the proximal SFA down to the popliteal with monophasic waveforms in the distal tibials.  The posterior tibial however has biphasic waveforms noted.  He does have good toe waveforms noted in the left lower extremity.   Review of Systems  Musculoskeletal: Positive for gait problem.  Skin: Negative for wound.  Neurological: Positive for weakness.  All other systems reviewed and are negative.      Objective:   Physical Exam Vitals reviewed.  HENT:     Head: Normocephalic.  Cardiovascular:     Rate and Rhythm: Normal rate.     Pulses:          Dorsalis pedis pulses are 1+ on the left side.       Posterior tibial pulses are detected w/ Doppler on the left side.  Pulmonary:     Effort: Pulmonary effort is normal.  Musculoskeletal:     Right Lower Extremity: Right leg is amputated above knee.   Neurological:     Mental Status: He is alert and oriented to person, place, and time.     Motor: Weakness present.  Psychiatric:        Mood and Affect: Mood normal.        Behavior: Behavior normal.        Thought Content: Thought content normal.        Judgment: Judgment normal.     BP (!) 189/104 (BP Location: Left Arm)   Pulse 69   Resp 16   Past Medical History:  Diagnosis Date  . Anemia   . Anxiety   . CHF (congestive heart failure) ([place])   . Chronic kidney disease    esrd dialysis m/w/f  . Gout   . Hyperlipidemia   . Hypertension   . Myocardial infarction ([place]) 2010   10 years ago  . 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  . Peripheral vascular disease ([place])     Social History   Socioeconomic History  . Marital status: Single    Spouse name: Not on file  . Number of children: 0  . Years of education: Not on file  . Highest education level: Not on file  Occupational History  . Occupation: retired    Comment: Medical laboratory scientific officer  Tobacco Use  . Smoking status: Former Smoker    Types: Cigarettes    Quit date: 05/17/2005  Years since quitting: 15.3  . Smokeless tobacco: Never Used  Vaping Use  . Vaping Use: Never used  Substance and Sexual Activity  . Alcohol use: No  . Drug use: No  . Sexual activity: Not Currently  Other Topics Concern  . Not on file  Social History Narrative  . Not on file   Social Determinants of Health   Financial Resource Strain: Not on file  Food Insecurity: Not on file  Transportation Needs: Not on file  Physical Activity: Not on file  Stress: Not on file  Social Connections: Not on file  Intimate Partner Violence: Not on file    Past Surgical History:  Procedure Laterality Date  . A/V FISTULAGRAM Right 09/06/2018   Procedure: A/V FISTULAGRAM;  Surgeon: Algernon Huxley, MD;  Location: [facility];  Service: Cardiovascular;  Laterality: Right;  . A/V SHUNTOGRAM Left  06/21/2017   Procedure: A/V SHUNTOGRAM;  Surgeon: Katha Cabal, MD;  Location: [facility];  Service: Cardiovascular;  Laterality: Left;  . A/V SHUNTOGRAM N/A 10/24/2018   Procedure: A/V SHUNTOGRAM;  Surgeon: Algernon Huxley, MD;  Location: [place] [facility];  Service: Cardiovascular;  Laterality: N/A;  . ABOVE KNEE LEG AMPUTATION Right 2020  . AMPUTATION Right 10/25/2018   Procedure: AMPUTATION ABOVE KNEE;  Surgeon: Algernon Huxley, MD;  Location: [facility];  Service: General;  Laterality: Right;  . APPLICATION OF WOUND VAC Right 04/11/2018   Procedure: APPLICATION OF WOUND VAC;  Surgeon: Algernon Huxley, MD;  Location: [facility];  Service: Vascular;  Laterality: Right;  . AV FISTULA PLACEMENT Left 09/18/2015   Procedure: INSERTION OF ARTERIOVENOUS (AV) GORE-TEX GRAFT ARM ( BRACH/AXILLARY GRAFT W/ INSTANT STICK GRAFT );  Surgeon: Katha Cabal, MD;  Location: [facility];  Service: Vascular;  Laterality: Left;  . AV FISTULA PLACEMENT Right 07/19/2018   Procedure: INSERTION OF ARTERIOVENOUS (AV) GORE-TEX GRAFT ARM ( BRACHIAL AXILLARY);  Surgeon: Algernon Huxley, MD;  Location: [facility];  Service: Vascular;  Laterality: Right;  . DIALYSIS FISTULA CREATION Right 10/2017   right chest perm cath  . DIALYSIS/PERMA CATHETER INSERTION N/A 06/09/2020   Procedure: DIALYSIS/PERMA CATHETER INSERTION;  Surgeon: Algernon Huxley, MD;  Location: [place] [facility];  Service: Cardiovascular;  Laterality: N/A;  . DIALYSIS/PERMA CATHETER INSERTION N/A 07/30/2020   Procedure: DIALYSIS/PERMA CATHETER EXCHANGE;  Surgeon: Algernon Huxley, MD;  Location: [place] [facility];  Service: Cardiovascular;  Laterality: N/A;  . DIALYSIS/PERMA CATHETER REMOVAL N/A 09/13/2018   Procedure: DIALYSIS/PERMA CATHETER REMOVAL;  Surgeon: Algernon Huxley, MD;  Location: [place] [facility];  Service: Cardiovascular;  Laterality: N/A;  . ESOPHAGOGASTRODUODENOSCOPY N/A 12/19/2017   Procedure: ESOPHAGOGASTRODUODENOSCOPY (EGD);  Surgeon: Lin Landsman, MD;  Location: [facility];  Service: Gastroenterology;  Laterality: N/A;  . LOWER EXTREMITY ANGIOGRAPHY Left 11/16/2017   Procedure: LOWER EXTREMITY ANGIOGRAPHY;  Surgeon: Algernon Huxley, MD;  Location: [place];  Service: Cardiovascular;  Laterality: Left;  . LOWER EXTREMITY ANGIOGRAPHY Right 01/18/2018   Procedure: LOWER EXTREMITY ANGIOGRAPHY;  Surgeon: Algernon Huxley, MD;  Location: [place] [facility];  Service: Cardiovascular;  Laterality: Right;  . LOWER EXTREMITY ANGIOGRAPHY Left 04/02/2018   Procedure: LOWER EXTREMITY ANGIOGRAPHY;  Surgeon: Algernon Huxley, MD;  Location: [place] [facility];  Service: Cardiovascular;  Laterality: Left;  . LOWER EXTREMITY ANGIOGRAPHY Right 04/09/2018   Procedure: Lower Extremity Angiography with possible intervention;  Surgeon: Algernon Huxley, MD;  Location: [facility];  Service: Cardiovascular;  Laterality: Right;  . LOWER EXTREMITY ANGIOGRAPHY Right 07/23/2018   Procedure: Lower Extremity Angiography;  Surgeon: Algernon Huxley, MD;  Location: [place] [facility];  Service: Cardiovascular;  Laterality: Right;  . LOWER EXTREMITY ANGIOGRAPHY Right 09/13/2018   Procedure: LOWER EXTREMITY ANGIOGRAPHY;  Surgeon: Algernon Huxley, MD;  Location: [place] [facility];  Service: Cardiovascular;  Laterality: Right;  . LOWER EXTREMITY VENOGRAPHY Right 09/13/2018   Procedure: LOWER EXTREMITY VENOGRAPHY;  Surgeon: Algernon Huxley, MD;  Location: [place] [facility];  Service: Cardiovascular;  Laterality: Right;  . PERIPHERAL VASCULAR CATHETERIZATION Left 09/01/2015   Procedure: A/V Shuntogram/Fistulagram;  Surgeon: Katha Cabal, MD;  Location: [place] [facility];  Service: Cardiovascular;  Laterality: Left;  . PERIPHERAL VASCULAR CATHETERIZATION N/A 09/30/2015   Procedure: A/V Shuntogram/Fistulagram with perm cathether removal;  Surgeon: Algernon Huxley, MD;  Location: [place] [facility];  Service: Cardiovascular;  Laterality: N/A;  . PERIPHERAL  VASCULAR CATHETERIZATION Left 09/30/2015   Procedure: A/V Shunt Intervention;  Surgeon: Algernon Huxley, MD;  Location: [place] [facility];  Service: Cardiovascular;  Laterality: Left;  . PERIPHERAL VASCULAR CATHETERIZATION Left 12/03/2015   Procedure: Thrombectomy;  Surgeon: Algernon Huxley, MD;  Location: [place] [facility];  Service: Cardiovascular;  Laterality: Left;  . PERIPHERAL VASCULAR CATHETERIZATION Left 01/28/2016   Procedure: Thrombectomy;  Surgeon: Algernon Huxley, MD;  Location: [place] [facility];  Service: Cardiovascular;  Laterality: Left;  . PERIPHERAL VASCULAR CATHETERIZATION N/A 01/28/2016   Procedure: A/V Shuntogram/Fistulagram;  Surgeon: Algernon Huxley, MD;  Location: [place] [facility];  Service: Cardiovascular;  Laterality: N/A;  . SKIN SPLIT GRAFT Right 05/24/2018   Procedure: SKIN GRAFT SPLIT THICKNESS ( RIGHT CALF);  Surgeon: Algernon Huxley, MD;  Location: [facility];  Service: Vascular;  Laterality: Right;  . UPPER EXTREMITY ANGIOGRAPHY  10/24/2018   Procedure: Upper Extremity Angiography;  Surgeon: Algernon Huxley, MD;  Location: [place] [facility];  Service: Cardiovascular;;  . UPPER EXTREMITY ANGIOGRAPHY Right 02/14/2019   Procedure: UPPER EXTREMITY ANGIOGRAPHY;  Surgeon: Algernon Huxley, MD;  Location: [place] [facility];  Service: Cardiovascular;  Laterality: Right;  . UPPER EXTREMITY ANGIOGRAPHY Right 04/20/2020   Procedure: UPPER EXTREMITY ANGIOGRAPHY;  Surgeon: Algernon Huxley, MD;  Location: [place] [facility];  Service: Cardiovascular;  Laterality: Right;  . WOUND DEBRIDEMENT Right 04/11/2018   Procedure: DEBRIDEMENT WOUND calf muscle and skin;  Surgeon: Algernon Huxley, MD;  Location: [facility];  Service: Vascular;  Laterality: Right;    Family History  Problem Relation Age of Onset  . Hypertension Other   . Heart disease Other   . Diabetes Mother     Allergies  Allergen Reactions  . Shellfish Allergy Anaphylaxis  . Other     CBC Latest Ref Rng & Units 08/21/2020  07/30/2020 07/30/2020  WBC 4.0 - 10.5 K/uL 8.6 7.6 7.1  Hemoglobin 13.0 - 17.0 g/dL 10.0(L) 10.7(L) 11.3(L)  Hematocrit 39.0 - 52.0 % 31.8(L) 33.6(L) 36.7(L)  Platelets 150 - 400 K/uL 172 139(L) 149(L)      CMP     Component Value Date/Time   NA 140 08/21/2020 0713   NA 139 01/17/2014 0502   K 4.7 08/21/2020 0713   K 4.8 01/17/2014 0502   CL 105 08/21/2020 0713   CL 102 01/17/2014 0502   CO2 23 08/21/2020 0713   CO2 27 01/17/2014 0502   GLUCOSE 79 08/21/2020 0713  GLUCOSE 101 (H) 01/17/2014 0502   BUN 41 (H) 08/21/2020 0713   BUN 62 (H) 01/17/2014 0502   CREATININE 10.24 (H) 08/21/2020 0713   CREATININE 12.30 (H) 01/17/2014 0502   CALCIUM 8.1 (L) 08/21/2020 0713   CALCIUM 8.2 (L) 01/17/2014 0502   PROT 6.7 06/24/2020 2320   PROT 7.9 06/20/2011 2221   ALBUMIN 3.4 (L) 06/24/2020 2320   ALBUMIN 3.1 (L) 01/16/2014 1336   AST 13 (L) 06/24/2020 2320   AST 33 06/20/2011 2221   ALT 7 06/24/2020 2320   ALT 22 06/20/2011 2221   ALKPHOS 51 06/24/2020 2320   ALKPHOS 48 (L) 06/20/2011 2221   BILITOT 0.4 06/24/2020 2320   BILITOT 0.3 06/20/2011 2221   GFRNONAA 5 (L) 08/21/2020 0713   GFRNONAA 4 (L) 01/17/2014 0502   GFRAA 6 (L) 01/21/2020 1056   GFRAA 4 (L) 01/17/2014 0502     No results found.     Assessment & Plan:   1. Atherosclerosis of native arteries of the extremities with ulceration ([place])  Recommend:  The patient has evidence of atherosclerosis of the lower extremities with claudication.  The patient does not voice lifestyle limiting changes at this point in time.  Noninvasive studies do not suggest clinically significant change.  No invasive studies, angiography or surgery at this time The patient should continue walking and begin a more formal exercise program.  The patient should continue antiplatelet therapy and aggressive treatment of the lipid abnormalities  No changes in the patient's medications at this time  The patient should continue wearing  graduated compression socks 10-15 mmHg strength to control the mild edema.    Return in 6 months with noninvasive studies. 2. End stage renal disease ([place]) The patient is currently PermCath dependent.  He denies any issues with his PermCath currently.  He notes that was recently replaced.  3. Hypertension, unspecified type Continue antihypertensive medications as already ordered, these medications have been reviewed and there are no changes at this time.   4. Hyperlipidemia, unspecified hyperlipidemia type Continue statin as ordered and reviewed, no changes at this time    Current Outpatient Medications on File Prior to Visit  Medication Sig Dispense Refill  . allopurinol (ZYLOPRIM) 100 MG tablet Take 100 mg by mouth daily.    [name] aspirin EC 81 MG tablet Take 1 tablet (81 mg total) by mouth daily. 150 tablet 2  . atorvastatin (LIPITOR) 10 MG tablet Take 1 tablet by mouth daily.    . clonazePAM (KLONOPIN) 0.5 MG tablet Take 1 tablet (0.5 mg total) by mouth as needed. (Patient taking differently: Take 0.5 mg by mouth 2 (two) times daily.) 10 tablet 0  . clopidogrel (PLAVIX) 75 MG tablet Take 1 tablet (75 mg total) by mouth daily. 30 tablet 11  . folic acid-vitamin b complex-vitamin c-selenium-zinc (DIALYVITE) 3 MG TABS tablet Take 1 tablet by mouth daily.     [name] gabapentin (NEURONTIN) 100 MG capsule Take 2 capsules (200 mg total) by mouth 2 (two) times daily. After dialysis session    . hydrALAZINE (APRESOLINE) 25 MG tablet Hold until followup with outpatient doctor due to intermittently low blood pressure.    [name] losartan (COZAAR) 100 MG tablet Hold until followup with outpatient doctor due to intermittently low blood pressure.    . metoprolol tartrate (LOPRESSOR) 25 MG tablet Take 25 mg by mouth every evening.    [name] oxyCODONE (ROXICODONE) 5 MG immediate release tablet Take 1-2 tablets (5-10 mg total) by mouth every 6 (six)  hours as needed for severe pain (No more than 6 tabs daily). 20 tablet 0   . sevelamer carbonate (RENVELA) 800 MG tablet Take 2,400 mg by mouth 3 (three) times daily with meals.      No current facility-administered medications on file prior to visit.    There are no Patient Instructions on file for this visit. No follow-ups on file.   Kris Hartmann, NP

## 2020-09-29 ENCOUNTER — Telehealth (INDEPENDENT_AMBULATORY_CARE_PROVIDER_SITE_OTHER): Payer: Self-pay | Admitting: Vascular Surgery

## 2020-09-29 NOTE — Telephone Encounter (Signed)
The request is for [facility] outpatient rehab

## 2020-09-29 NOTE — Telephone Encounter (Signed)
Called stating that patient is requesting more PT. Patient was last seen 09/22/20 with arterial and abi studies (FB). Please advise.

## 2020-09-29 NOTE — Telephone Encounter (Signed)
I have no issues with that.  Usually PT reaches out to ask if they can have more sessions if they feel it is warranted.

## 2020-09-30 ENCOUNTER — Other Ambulatory Visit (INDEPENDENT_AMBULATORY_CARE_PROVIDER_SITE_OTHER): Payer: Self-pay | Admitting: Nurse Practitioner

## 2020-09-30 DIAGNOSIS — S78111A Complete traumatic amputation at level between right hip and knee, initial encounter: Secondary | ICD-10-CM

## 2020-09-30 NOTE — Telephone Encounter (Signed)
Referral sent 

## 2020-11-16 ENCOUNTER — Telehealth (INDEPENDENT_AMBULATORY_CARE_PROVIDER_SITE_OTHER): Payer: Self-pay | Admitting: Vascular Surgery

## 2020-11-16 NOTE — Telephone Encounter (Signed)
[name] patients friend called to let us know patient could not do dialysis. Patient is to have surgery tomorrow at [facility].  Documentation purposes only.

## 2021-01-15 ENCOUNTER — Telehealth (INDEPENDENT_AMBULATORY_CARE_PROVIDER_SITE_OTHER): Payer: Self-pay | Admitting: Vascular Surgery

## 2021-01-15 NOTE — Telephone Encounter (Signed)
Called to inform us that a facility in [place] will attempt to perform vascular surgery on patient to remove fistula in chest and place in his harm.  Ms. [name] just wanted to make doctor/nurses aware.   This note is for documentation purposes

## 2021-01-26 ENCOUNTER — Ambulatory Visit: Payer: Medicare Other | Attending: Nurse Practitioner | Admitting: Physical Therapy

## 2021-01-28 ENCOUNTER — Ambulatory Visit: Payer: Medicare Other | Admitting: Physical Therapy

## 2021-02-02 ENCOUNTER — Ambulatory Visit: Payer: Medicare Other | Admitting: Physical Therapy

## 2021-02-04 ENCOUNTER — Ambulatory Visit: Payer: Medicare Other | Admitting: Physical Therapy

## 2021-02-09 ENCOUNTER — Ambulatory Visit: Payer: Medicare Other | Admitting: Physical Therapy

## 2021-02-11 ENCOUNTER — Ambulatory Visit: Payer: Medicare Other | Admitting: Physical Therapy

## 2021-02-16 ENCOUNTER — Ambulatory Visit: Payer: Medicare Other | Admitting: Physical Therapy

## 2021-02-18 ENCOUNTER — Ambulatory Visit: Payer: Medicare Other | Admitting: Physical Therapy

## 2021-02-23 ENCOUNTER — Ambulatory Visit: Payer: Medicare Other | Admitting: Physical Therapy

## 2021-02-25 ENCOUNTER — Ambulatory Visit: Payer: Medicare Other | Admitting: Physical Therapy

## 2021-03-02 ENCOUNTER — Ambulatory Visit: Payer: Medicare Other | Admitting: Physical Therapy

## 2021-03-04 ENCOUNTER — Ambulatory Visit: Payer: Medicare Other | Admitting: Physical Therapy

## 2021-03-08 ENCOUNTER — Other Ambulatory Visit (INDEPENDENT_AMBULATORY_CARE_PROVIDER_SITE_OTHER): Payer: Self-pay | Admitting: Nurse Practitioner

## 2021-03-08 DIAGNOSIS — I7025 Atherosclerosis of native arteries of other extremities with ulceration: Secondary | ICD-10-CM

## 2021-03-09 ENCOUNTER — Ambulatory Visit (INDEPENDENT_AMBULATORY_CARE_PROVIDER_SITE_OTHER): Payer: Medicare Other | Admitting: Vascular Surgery

## 2021-03-09 ENCOUNTER — Encounter (INDEPENDENT_AMBULATORY_CARE_PROVIDER_SITE_OTHER): Payer: Self-pay | Admitting: Vascular Surgery

## 2021-03-09 ENCOUNTER — Other Ambulatory Visit: Payer: Self-pay

## 2021-03-09 ENCOUNTER — Ambulatory Visit (INDEPENDENT_AMBULATORY_CARE_PROVIDER_SITE_OTHER): Payer: Medicare Other

## 2021-03-09 VITALS — BP 156/80 | HR 72 | Ht 69.0 in | Wt 178.0 lb

## 2021-03-09 DIAGNOSIS — E1169 Type 2 diabetes mellitus with other specified complication: Secondary | ICD-10-CM | POA: Diagnosis not present

## 2021-03-09 DIAGNOSIS — I10 Essential (primary) hypertension: Secondary | ICD-10-CM

## 2021-03-09 DIAGNOSIS — I7025 Atherosclerosis of native arteries of other extremities with ulceration: Secondary | ICD-10-CM

## 2021-03-09 DIAGNOSIS — N186 End stage renal disease: Secondary | ICD-10-CM | POA: Diagnosis not present

## 2021-03-09 DIAGNOSIS — E785 Hyperlipidemia, unspecified: Secondary | ICD-10-CM

## 2021-03-09 NOTE — Assessment & Plan Note (Signed)
blood glucose control important in reducing the progression of atherosclerotic disease. Also, involved in wound healing. On appropriate medications.  

## 2021-03-09 NOTE — Assessment & Plan Note (Signed)
Noninvasive studies today demonstrate noncompressible vessels but his digital pressure on the left is 107 with a strong digital waveform.  Perfusion is currently intact.  No role for intervention.  Continue current medical regimen.  Recheck in 6 months.

## 2021-03-09 NOTE — Assessment & Plan Note (Addendum)
Longstanding with limited access options.  Currently catheter dependent.  Has also seen a vascular surgeon in [place].  I would be very leery of placing any upper extremity access due to the severe problems he had with steal in the right arm when he nearly lost the hand in the past.  Lower extremity access might be an option but he does have severe calcific arterial disease and this will be challenging as well.  If he has this on his left side, he will likely develop a steal type syndrome in the left leg as well.  Overall, remaining catheter dependent is probably a reasonable option.

## 2021-03-09 NOTE — Assessment & Plan Note (Signed)
lipid control important in reducing the progression of atherosclerotic disease. Continue statin therapy  

## 2021-03-09 NOTE — Assessment & Plan Note (Signed)
blood pressure control important in reducing the progression of atherosclerotic disease. On appropriate oral medications.  

## 2021-03-09 NOTE — Progress Notes (Signed)
MRN : [id number]  Johnny [name]. is a 70 y.o. (1950/05/16) male who presents with chief complaint of  Chief Complaint  Patient presents with   Follow-up    6 mo U/S  .  History of Present Illness: Patient returns today in follow up of multiple issues.  He is here predominantly to follow-up on his peripheral arterial disease.  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  This is well-healed and he has prosthesis.  He has trouble with prosthesis because it is so heavy and wants to see if there is a different option.  I told him I am not sure, but I can certainly refer him back to the [place] [facility] to see what options are available.  No current left leg problems.  He is undergone revascularization on the left side as well.  Noninvasive studies today demonstrate noncompressible vessels but his digital pressure on the left is 107 with a strong digital waveform. He also would like to discuss his dialysis access.  He has been referred to [facility] who is done his most recent dialysis work.  He is currently catheter dependent with a right jugular PermCath.  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  They are in discussions on whether to leave the catheter in or place a new access in his left arm.  Current Outpatient Medications  Medication Sig Dispense Refill   allopurinol (ZYLOPRIM) 100 MG tablet Take 100 mg by mouth daily.     aspirin EC 81 MG tablet Take 1 tablet (81 mg total) by mouth daily. 150 tablet 2   atorvastatin (LIPITOR) 10 MG tablet Take 1 tablet by mouth daily.     clonazePAM (KLONOPIN) 0.5 MG tablet Take 1 tablet (0.5 mg total) by mouth as needed. (Patient taking differently: Take 0.5 mg by mouth 2 (two) times daily.) 10 tablet 0   clopidogrel (PLAVIX) 75 MG tablet Take 1 tablet (75 mg total) by mouth daily. 30 tablet  11   folic acid-vitamin b complex-vitamin c-selenium-zinc (DIALYVITE) 3 MG TABS tablet Take 1 tablet by mouth daily.      gabapentin (NEURONTIN) 100 MG capsule Take 2 capsules (200 mg total) by mouth 2 (two) times daily. After dialysis session     hydrALAZINE (APRESOLINE) 25 MG tablet Hold until followup with outpatient doctor due to intermittently low blood pressure.     losartan (COZAAR) 100 MG tablet Hold until followup with outpatient doctor due to intermittently low blood pressure.     metoprolol tartrate (LOPRESSOR) 25 MG tablet Take 25 mg by mouth every evening.     oxyCODONE (ROXICODONE) 5 MG immediate release tablet Take 1-2 tablets (5-10 mg total) by mouth every 6 (six) hours as needed for severe pain (No more than 6 tabs daily). 20 tablet 0   sevelamer carbonate (RENVELA) 800 MG tablet Take 2,400 mg by mouth 3 (three) times daily with meals.      No current facility-administered medications for this visit.    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   Peripheral vascular disease [facility])     Past Surgical History:  Procedure Laterality Date   A/V FISTULAGRAM Right 09/06/2018   Procedure: A/V FISTULAGRAM;  Surgeon: Algernon Huxley, MD;  Location: [place] [facility];  Service: Cardiovascular;  Laterality: Right;   A/V SHUNTOGRAM Left 06/21/2017   Procedure: A/V SHUNTOGRAM;  Surgeon: Katha Cabal, MD;  Location: [place] [facility];  Service: Cardiovascular;  Laterality: Left;   A/V SHUNTOGRAM N/A 10/24/2018   Procedure: A/V SHUNTOGRAM;  Surgeon: Algernon Huxley, MD;  Location: [place] [facility];  Service: Cardiovascular;  Laterality: N/A;   ABOVE KNEE LEG AMPUTATION Right 2020   AMPUTATION Right  10/25/2018   Procedure: AMPUTATION ABOVE KNEE;  Surgeon: Algernon Huxley, MD;  Location: [facility];  Service: General;  Laterality: Right;   APPLICATION OF WOUND VAC Right 04/11/2018   Procedure: APPLICATION OF WOUND VAC;  Surgeon: Algernon Huxley, MD;  Location: [facility];  Service: Vascular;  Laterality: Right;   AV FISTULA PLACEMENT Left 09/18/2015   Procedure: INSERTION OF ARTERIOVENOUS (AV) GORE-TEX GRAFT ARM ( BRACH/AXILLARY GRAFT W/ INSTANT STICK GRAFT );  Surgeon: Katha Cabal, MD;  Location: [facility];  Service: Vascular;  Laterality: Left;   AV FISTULA PLACEMENT Right 07/19/2018   Procedure: INSERTION OF ARTERIOVENOUS (AV) GORE-TEX GRAFT ARM ( BRACHIAL AXILLARY);  Surgeon: Algernon Huxley, MD;  Location: [facility];  Service: Vascular;  Laterality: Right;   DIALYSIS FISTULA CREATION Right 10/2017   right chest perm cath   DIALYSIS/PERMA CATHETER INSERTION N/A 06/09/2020   Procedure: DIALYSIS/PERMA CATHETER INSERTION;  Surgeon: Algernon Huxley, MD;  Location: [place] [facility];  Service: Cardiovascular;  Laterality: N/A;   DIALYSIS/PERMA CATHETER INSERTION N/A 07/30/2020   Procedure: DIALYSIS/PERMA CATHETER EXCHANGE;  Surgeon: Algernon Huxley, MD;  Location: [place] [facility];  Service: Cardiovascular;  Laterality: N/A;   DIALYSIS/PERMA CATHETER REMOVAL N/A 09/13/2018   Procedure: DIALYSIS/PERMA CATHETER REMOVAL;  Surgeon: Algernon Huxley, MD;  Location: [place] [facility];  Service: Cardiovascular;  Laterality: N/A;   ESOPHAGOGASTRODUODENOSCOPY N/A 12/19/2017   Procedure: ESOPHAGOGASTRODUODENOSCOPY (EGD);  Surgeon: Lin Landsman, MD;  Location[facility];  Service: Gastroenterology;  Laterality: N/A;   LOWER EXTREMITY ANGIOGRAPHY Left 11/16/2017   Procedure: LOWER EXTREMITY ANGIOGRAPHY;  Surgeon: Algernon Huxley, MD;  Location: [place] [facility];  Service: Cardiovascular;  Laterality: Left;   LOWER EXTREMITY ANGIOGRAPHY Right 01/18/2018   Procedure: LOWER EXTREMITY ANGIOGRAPHY;  Surgeon: Algernon Huxley, MD;  Location: [place] [facility];  Service: Cardiovascular;  Laterality: Right;   LOWER EXTREMITY ANGIOGRAPHY Left 04/02/2018   Procedure: LOWER EXTREMITY ANGIOGRAPHY;  Surgeon: Algernon Huxley, MD;  Location: [place] [facility];  Service: Cardiovascular;  Laterality: Left;   LOWER EXTREMITY ANGIOGRAPHY Right 04/09/2018   Procedure: Lower Extremity Angiography with possible intervention;  Surgeon: Algernon Huxley, MD;  Location: [place] [facility];  Service: Cardiovascular;  Laterality: Right;   LOWER EXTREMITY ANGIOGRAPHY Right 07/23/2018   Procedure: Lower Extremity Angiography;  Surgeon: Algernon Huxley, MD;  Location: [place] [facility];  Service: Cardiovascular;  Laterality: Right;   LOWER EXTREMITY ANGIOGRAPHY Right 09/13/2018   Procedure: LOWER EXTREMITY ANGIOGRAPHY;  Surgeon: Algernon Huxley, MD;  Location: [place] [facility];  Service: Cardiovascular;  Laterality: Right;   LOWER EXTREMITY VENOGRAPHY Right 09/13/2018   Procedure: LOWER EXTREMITY VENOGRAPHY;  Surgeon: Algernon Huxley, MD;  Location: [facility]  INV[facility];  Service: Cardiovascular;  Laterality: Right;   PERIPHERAL VASCULAR CATHETERIZATION Left 09/01/2015   Procedure: A/V Shuntogram/Fistulagram;  Surgeon: Katha Cabal, MD;  Location: [place] [facility];  Service: Cardiovascular;  Laterality: Left;   PERIPHERAL VASCULAR CATHETERIZATION N/A 09/30/2015   Procedure: A/V Shuntogram/Fistulagram with perm cathether removal;  Surgeon: Algernon Huxley, MD;  Location: [place] [facility];  Service: Cardiovascular;  Laterality: N/A;   PERIPHERAL VASCULAR CATHETERIZATION Left 09/30/2015   Procedure: A/V Shunt Intervention;  Surgeon: Algernon Huxley, MD;  Location: [place] [facility];  Service: Cardiovascular;  Laterality: Left;   PERIPHERAL VASCULAR CATHETERIZATION Left 12/03/2015   Procedure: Thrombectomy;  Surgeon: Algernon Huxley, MD;  Location: [place] [facility];  Service: Cardiovascular;  Laterality: Left;   PERIPHERAL VASCULAR  CATHETERIZATION Left 01/28/2016   Procedure: Thrombectomy;  Surgeon: Algernon Huxley, MD;  Location: [place] [facility];  Service: Cardiovascular;  Laterality: Left;   PERIPHERAL VASCULAR CATHETERIZATION N/A 01/28/2016   Procedure: A/V Shuntogram/Fistulagram;  Surgeon: Algernon Huxley, MD;  Location: [place] [facility];  Service: Cardiovascular;  Laterality: N/A;   SKIN SPLIT GRAFT Right 05/24/2018   Procedure: SKIN GRAFT SPLIT THICKNESS ( RIGHT CALF);  Surgeon: Algernon Huxley, MD;  Location: [facility];  Service: Vascular;  Laterality: Right;   UPPER EXTREMITY ANGIOGRAPHY  10/24/2018   Procedure: Upper Extremity Angiography;  Surgeon: Algernon Huxley, MD;  Location: [place] [facility];  Service: Cardiovascular;;   UPPER EXTREMITY ANGIOGRAPHY Right 02/14/2019   Procedure: UPPER EXTREMITY ANGIOGRAPHY;  Surgeon: Algernon Huxley, MD;  Location: [place] [facility];  Service: Cardiovascular;  Laterality: Right;   UPPER EXTREMITY ANGIOGRAPHY Right 04/20/2020   Procedure: UPPER EXTREMITY ANGIOGRAPHY;  Surgeon: Algernon Huxley, MD;  Location: [place] [facility];  Service: Cardiovascular;  Laterality: Right;   WOUND DEBRIDEMENT Right 04/11/2018   Procedure: DEBRIDEMENT WOUND calf muscle and skin;  Surgeon: Algernon Huxley, MD;  Location: [facility];  Service: Vascular;  Laterality: Right;     Social History   Tobacco Use   Smoking status: Former    Types: Cigarettes    Quit date: 05/17/2005    Years since quitting: 15.8   Smokeless tobacco: Never  Vaping Use   Vaping Use: Never used  Substance Use Topics   Alcohol use: No   Drug use: No       Family History  Problem Relation Age of Onset   Hypertension Other    Heart disease Other    Diabetes Mother   No bleeding or clotting disorders  Allergies  Allergen Reactions   Shellfish Allergy Anaphylaxis   Other      REVIEW OF SYSTEMS (Negative unless checked)  Constitutional: [] Weight loss  [] Fever  [] Chills Cardiac: [] Chest pain   [] Chest pressure    [] Palpitations   [] Shortness of breath when laying flat   [] Shortness of breath at rest   [] Shortness of breath with exertion. Vascular:  [] Pain in legs with walking   [] Pain in legs at rest   [] Pain in legs when laying flat   [] Claudication   [] Pain in feet when walking  [] Pain in feet at rest  [] Pain in feet when laying flat   [] History of DVT   [] Phlebitis   [x] Swelling in legs   [] Varicose veins   [x] Non-healing ulcers Pulmonary:   [] Uses home oxygen   [] Productive cough   [] Hemoptysis   [] Wheeze  [] COPD   [] Asthma Neurologic:  [] Dizziness  []   Blackouts   [] Seizures   [] History of stroke   [] History of TIA  [] Aphasia   [] Temporary blindness   [] Dysphagia   [] Weakness or numbness in arms   [] Weakness or numbness in legs Musculoskeletal:  [x] Arthritis   [] Joint swelling   [] Joint pain   [x] Low back pain Hematologic:  [] Easy bruising  [] Easy bleeding   [] Hypercoagulable state   [x] Anemic   Gastrointestinal:  [] Blood in stool   [] Vomiting blood  [x] Gastroesophageal reflux/heartburn   [] Abdominal pain Genitourinary:  [x] Chronic kidney disease   [] Difficult urination  [] Frequent urination  [] Burning with urination   [] Hematuria Skin:  [] Rashes   [] Ulcers   [] Wounds Psychological:  [] History of anxiety   []  History of major depression.  Physical Examination  BP (!) 156/80   Pulse 72   Ht 5\' 9"  (1.753 m)   Wt 178 lb (80.7 kg)   BMI 26.29 kg/m  Gen:  WD/WN, NAD Head: [place]/AT, No temporalis wasting. Ear/Nose/Throat: Hearing grossly intact, nares w/o erythema or drainage Eyes: Conjunctiva clear. Sclera non-icteric Neck: Supple.  Trachea midline Pulmonary:  Good air movement, no use of accessory muscles.  Cardiac: Irregular Vascular: PermCath present in the right chest Vessel Right Left  Radial Palpable Palpable                          PT Not Palpable 1+ Palpable  DP Not Palpable 1+ Palpable   Gastrointestinal: soft, non-tender/non-distended. No guarding/reflex.  Musculoskeletal: M/S 5/5  throughout.  In a motorized wheelchair.  Right AKA present and well-healed with prosthesis in place.  Mild left lower extremity edema. Neurologic: Sensation grossly intact in extremities.  Symmetrical.  Speech is fluent.  Psychiatric: Judgment intact, Mood & affect appropriate for pt's clinical situation. Dermatologic: No rashes or ulcers noted.  No cellulitis or open wounds.      Labs No results found for this or any previous visit (from the past 2160 hour(s)).  Radiology No results found.  Assessment/Plan  Hypertension blood pressure control important in reducing the progression of atherosclerotic disease. On appropriate oral medications.   Type 2 diabetes mellitus with other specified complication, unspecified whether long term insulin use (HCC) blood glucose control important in reducing the progression of atherosclerotic disease. Also, involved in wound healing. On appropriate medications.   End-stage renal disease ([place]) Longstanding with limited access options.  Currently catheter dependent.  Has also seen a vascular surgeon in [place].  I would be very leery of placing any upper extremity access due to the severe problems he had with steal in the right arm when he nearly lost the hand in the past.  Lower extremity access might be an option but he does have severe calcific arterial disease and this will be challenging as well.  If he has this on his left side, he will likely develop a steal type syndrome in the left leg as well.  Overall, remaining catheter dependent is probably a reasonable option.  Hyperlipidemia lipid control important in reducing the progression of atherosclerotic disease. Continue statin therapy   Atherosclerosis of native arteries of the extremities with ulceration ([place]) Noninvasive studies today demonstrate noncompressible vessels but his digital pressure on the left is 107 with a strong digital waveform.  Perfusion is currently intact.  No role for  intervention.  Continue current medical regimen.  Recheck in 6 months.    Leotis Pain, MD  03/09/2021 11:05 AM    This note was created with Dragon medical  transcription system.  Any errors from dictation are purely unintentional

## 2021-03-10 ENCOUNTER — Encounter: Payer: Self-pay | Admitting: Anesthesiology

## 2021-03-10 ENCOUNTER — Encounter: Payer: Self-pay | Admitting: Ophthalmology

## 2021-03-11 ENCOUNTER — Ambulatory Visit: Payer: Medicare Other | Admitting: Physical Therapy

## 2021-03-16 ENCOUNTER — Ambulatory Visit: Payer: Medicare Other | Admitting: Physical Therapy

## 2021-03-18 ENCOUNTER — Ambulatory Visit: Payer: Medicare Other | Admitting: Physical Therapy

## 2021-03-23 ENCOUNTER — Ambulatory Visit: Payer: Medicare Other | Admitting: Physical Therapy

## 2021-03-25 ENCOUNTER — Ambulatory Visit: Payer: Medicare Other | Admitting: Physical Therapy

## 2021-03-30 ENCOUNTER — Ambulatory Visit: Payer: Medicare Other | Admitting: Physical Therapy

## 2021-04-05 ENCOUNTER — Ambulatory Visit: Admit: 2021-04-05 | Payer: Medicare Other | Admitting: Ophthalmology

## 2021-04-05 SURGERY — PHACOEMULSIFICATION, CATARACT, WITH IOL INSERTION
Anesthesia: Topical | Laterality: Right

## 2021-04-06 ENCOUNTER — Ambulatory Visit: Payer: Medicare Other | Admitting: Physical Therapy

## 2021-04-08 ENCOUNTER — Ambulatory Visit: Payer: Medicare Other | Admitting: Physical Therapy

## 2021-04-13 ENCOUNTER — Ambulatory Visit: Payer: Medicare Other | Admitting: Physical Therapy

## 2021-04-15 ENCOUNTER — Ambulatory Visit: Payer: Medicare Other | Admitting: Physical Therapy

## 2021-04-20 ENCOUNTER — Ambulatory Visit: Payer: Medicare Other | Admitting: Physical Therapy

## 2021-04-22 ENCOUNTER — Ambulatory Visit: Payer: Medicare Other | Admitting: Physical Therapy

## 2021-04-27 ENCOUNTER — Ambulatory Visit: Payer: Medicare Other | Admitting: Physical Therapy

## 2021-04-29 ENCOUNTER — Ambulatory Visit: Admit: 2021-04-29 | Payer: Medicare Other | Admitting: Ophthalmology

## 2021-04-29 HISTORY — DX: Presence of dental prosthetic device (complete) (partial): Z97.2

## 2021-04-29 HISTORY — DX: Acquired absence of right leg below knee: Z89.511

## 2021-04-29 HISTORY — DX: Dependence on renal dialysis: Z99.2

## 2021-04-29 SURGERY — PHACOEMULSIFICATION, CATARACT, WITH IOL INSERTION
Anesthesia: Choice | Laterality: Left

## 2021-05-11 ENCOUNTER — Ambulatory Visit: Payer: Medicare Other | Admitting: Physical Therapy

## 2021-05-13 ENCOUNTER — Ambulatory Visit: Payer: Medicare Other | Admitting: Physical Therapy

## 2021-05-18 ENCOUNTER — Ambulatory Visit: Payer: Medicare Other | Admitting: Physical Therapy

## 2021-05-20 ENCOUNTER — Ambulatory Visit: Payer: Medicare Other | Admitting: Physical Therapy

## 2021-05-25 ENCOUNTER — Ambulatory Visit: Payer: Medicare Other | Admitting: Physical Therapy

## 2021-05-27 ENCOUNTER — Ambulatory Visit: Payer: Medicare Other | Admitting: Physical Therapy

## 2021-06-01 ENCOUNTER — Ambulatory Visit: Payer: Medicare Other | Admitting: Physical Therapy

## 2021-06-03 ENCOUNTER — Ambulatory Visit: Payer: Medicare Other | Admitting: Physical Therapy

## 2021-06-08 ENCOUNTER — Ambulatory Visit: Payer: Medicare Other | Admitting: Physical Therapy

## 2021-06-10 ENCOUNTER — Ambulatory Visit: Payer: Medicare Other | Admitting: Physical Therapy

## 2021-07-01 ENCOUNTER — Ambulatory Visit: Payer: Medicare Other | Admitting: Anesthesiology

## 2021-07-01 ENCOUNTER — Encounter: Payer: Self-pay | Admitting: Ophthalmology

## 2021-07-01 ENCOUNTER — Other Ambulatory Visit: Payer: Self-pay

## 2021-07-01 ENCOUNTER — Encounter
Admission: RE | Disposition: A | Discharge status: Home / Self Care | Payer: Self-pay | Source: Home / Self Care | Attending: Ophthalmology

## 2021-07-01 ENCOUNTER — Ambulatory Visit
Admission: RE | Admit: 2021-07-01 | Discharge: 2021-07-01 | Disposition: A | Discharge status: Home / Self Care | Payer: Medicare Other | Attending: Ophthalmology | Admitting: Ophthalmology

## 2021-07-01 DIAGNOSIS — I509 Heart failure, unspecified: Secondary | ICD-10-CM | POA: Insufficient documentation

## 2021-07-01 DIAGNOSIS — N186 End stage renal disease: Secondary | ICD-10-CM | POA: Insufficient documentation

## 2021-07-01 DIAGNOSIS — Z992 Dependence on renal dialysis: Secondary | ICD-10-CM | POA: Insufficient documentation

## 2021-07-01 DIAGNOSIS — I2510 Atherosclerotic heart disease of native coronary artery without angina pectoris: Secondary | ICD-10-CM | POA: Insufficient documentation

## 2021-07-01 DIAGNOSIS — I132 Hypertensive heart and chronic kidney disease with heart failure and with stage 5 chronic kidney disease, or end stage renal disease: Secondary | ICD-10-CM | POA: Insufficient documentation

## 2021-07-01 DIAGNOSIS — I252 Old myocardial infarction: Secondary | ICD-10-CM | POA: Insufficient documentation

## 2021-07-01 DIAGNOSIS — H2512 Age-related nuclear cataract, left eye: Secondary | ICD-10-CM | POA: Insufficient documentation

## 2021-07-01 DIAGNOSIS — D759 Disease of blood and blood-forming organs, unspecified: Secondary | ICD-10-CM | POA: Insufficient documentation

## 2021-07-01 DIAGNOSIS — G5791 Unspecified mononeuropathy of right lower limb: Secondary | ICD-10-CM | POA: Insufficient documentation

## 2021-07-01 DIAGNOSIS — I739 Peripheral vascular disease, unspecified: Secondary | ICD-10-CM | POA: Diagnosis not present

## 2021-07-01 DIAGNOSIS — D649 Anemia, unspecified: Secondary | ICD-10-CM | POA: Diagnosis not present

## 2021-07-01 HISTORY — PX: CATARACT EXTRACTION W/PHACO: SHX586

## 2021-07-01 LAB — POCT I-STAT, CHEM 8
BUN: 29 mg/dL — ABNORMAL HIGH (ref 8–23)
Calcium, Ion: 0.98 mmol/L — ABNORMAL LOW (ref 1.15–1.40)
Chloride: 104 mmol/L (ref 98–111)
Creatinine, Ser: 8.3 mg/dL — ABNORMAL HIGH (ref 0.61–1.24)
Glucose, Bld: 87 mg/dL (ref 70–99)
HCT: 36 % — ABNORMAL LOW (ref 39.0–52.0)
Hemoglobin: 12.2 g/dL — ABNORMAL LOW (ref 13.0–17.0)
Potassium: 4.5 mmol/L (ref 3.5–5.1)
Sodium: 140 mmol/L (ref 135–145)
TCO2: 25 mmol/L (ref 22–32)

## 2021-07-01 SURGERY — PHACOEMULSIFICATION, CATARACT, WITH IOL INSERTION
Anesthesia: Monitor Anesthesia Care | Site: Eye | Laterality: Left

## 2021-07-01 MED ORDER — MIDAZOLAM HCL 2 MG/2ML IJ SOLN
INTRAMUSCULAR | Status: DC | PRN
  Administered 2021-07-01: 1 mg via INTRAVENOUS

## 2021-07-01 MED ORDER — SIGHTPATH DOSE#1 SODIUM HYALURONATE 23 MG/ML IO SOLUTION
PREFILLED_SYRINGE | INTRAOCULAR | Status: DC | PRN
  Administered 2021-07-01: 0.6 mL via INTRAOCULAR

## 2021-07-01 MED ORDER — CYCLOPENTOLATE HCL 2 % OP SOLN
1.0000 [drp] | OPHTHALMIC | Status: AC
  Administered 2021-07-01 (×3): 1 [drp] via OPHTHALMIC

## 2021-07-01 MED ORDER — CYCLOPENTOLATE HCL 2 % OP SOLN
OPHTHALMIC | Status: AC
  Administered 2021-07-01: 1 [drp] via OPHTHALMIC
  Filled 2021-07-01: qty 2

## 2021-07-01 MED ORDER — FENTANYL CITRATE (PF) 100 MCG/2ML IJ SOLN
INTRAMUSCULAR | Status: DC | PRN
  Administered 2021-07-01: 25 ug via INTRAVENOUS

## 2021-07-01 MED ORDER — TETRACAINE HCL 0.5 % OP SOLN
1.0000 [drp] | Freq: Once | OPHTHALMIC | Status: AC
  Administered 2021-07-01: 1 [drp] via OPHTHALMIC

## 2021-07-01 MED ORDER — MOXIFLOXACIN HCL 0.5 % OP SOLN
OPHTHALMIC | Status: DC | PRN
  Administered 2021-07-01: 0.2 mL via OPHTHALMIC

## 2021-07-01 MED ORDER — PHENYLEPHRINE HCL 10 % OP SOLN
OPHTHALMIC | Status: AC
  Administered 2021-07-01: 1 [drp] via OPHTHALMIC
  Filled 2021-07-01: qty 5

## 2021-07-01 MED ORDER — SIGHTPATH DOSE#1 BSS IO SOLN
INTRAOCULAR | Status: DC | PRN
  Administered 2021-07-01: 15 mL via INTRAOCULAR

## 2021-07-01 MED ORDER — FENTANYL CITRATE (PF) 100 MCG/2ML IJ SOLN
INTRAMUSCULAR | Status: AC
  Filled 2021-07-01: qty 2

## 2021-07-01 MED ORDER — MIDAZOLAM HCL 2 MG/2ML IJ SOLN
INTRAMUSCULAR | Status: AC
  Filled 2021-07-01: qty 2

## 2021-07-01 MED ORDER — SIGHTPATH DOSE#1 SODIUM HYALURONATE 10 MG/ML IO SOLUTION
PREFILLED_SYRINGE | INTRAOCULAR | Status: DC | PRN
  Administered 2021-07-01: 0.85 mL via INTRAOCULAR

## 2021-07-01 MED ORDER — MOXIFLOXACIN HCL 0.5 % OP SOLN
OPHTHALMIC | Status: AC
  Filled 2021-07-01: qty 3

## 2021-07-01 MED ORDER — MOXIFLOXACIN HCL 0.5 % OP SOLN: 1.0000 [drp] | Freq: Once | OPHTHALMIC | Status: DC

## 2021-07-01 MED ORDER — SIGHTPATH DOSE#1 BSS IO SOLN
INTRAOCULAR | Status: DC | PRN
  Administered 2021-07-01: 77 mL via OPHTHALMIC

## 2021-07-01 MED ORDER — PHENYLEPHRINE HCL 10 % OP SOLN
1.0000 [drp] | OPHTHALMIC | Status: AC
  Administered 2021-07-01 (×3): 1 [drp] via OPHTHALMIC

## 2021-07-01 MED ORDER — LIDOCAINE HCL (PF) 2 % IJ SOLN
INTRAOCULAR | Status: DC | PRN
  Administered 2021-07-01: 4 mL via INTRAOCULAR

## 2021-07-01 MED ORDER — SODIUM CHLORIDE 0.9 % IV SOLN: INTRAVENOUS | Status: DC

## 2021-07-01 MED ORDER — TETRACAINE HCL 0.5 % OP SOLN
OPHTHALMIC | Status: AC
  Administered 2021-07-01: 1 [drp] via OPHTHALMIC
  Filled 2021-07-01: qty 4

## 2021-07-01 SURGICAL SUPPLY — 11 items
CATARACT SUITE SIGHTPATH (MISCELLANEOUS) ×2 IMPLANT
FEE CATARACT SUITE SIGHTPATH (MISCELLANEOUS) ×1 IMPLANT
GLOVE SURG ENC MOIS LTX SZ8 (GLOVE) ×2 IMPLANT
GLOVE SURG ENC TEXT LTX SZ6.5 (GLOVE) ×2 IMPLANT
GLOVE SURG ENC TEXT LTX SZ7.5 (GLOVE) ×2 IMPLANT
LENS IOL TECNIS EYHANCE 19.5 (Intraocular Lens) ×1 IMPLANT
NDL FILTER BLUNT 18X1 1/2 (NEEDLE) ×2 IMPLANT
NEEDLE FILTER BLUNT 18X 1/2SAF (NEEDLE) ×2
NEEDLE FILTER BLUNT 18X1 1/2 (NEEDLE) ×2 IMPLANT
SYR 5ML LL (SYRINGE) IMPLANT
WATER STERILE IRR 250ML POUR (IV SOLUTION) ×2 IMPLANT

## 2021-07-01 NOTE — Op Note (Signed)
OPERATIVE NOTE  Johnny [name] [id number] 07/01/2021   PREOPERATIVE DIAGNOSIS:  Nuclear sclerotic cataract left eye.  H25.12   POSTOPERATIVE DIAGNOSIS:    Nuclear sclerotic cataract left eye.     PROCEDURE:  Phacoemusification with posterior chamber intraocular lens placement of the left eye   LENS:   Implant Name Type Inv. Item Serial No. Manufacturer Lot No. LRB No. Used Action  LENS IOL TECNIS EYHANCE 19.5 - [id number] Intraocular Lens LENS IOL TECNIS EYHANCE 19.5 [id number] [employer]  Left 1 Implanted      Procedure(s): CATARACT EXTRACTION PHACO AND INTRAOCULAR LENS PLACEMENT (IOC) LEFT 7.92 00:47.3 (Left)    SURGEON:  Benay Pillow, MD, MPH   ANESTHESIA:  Topical with tetracaine drops augmented with 1% preservative-free intracameral lidocaine.  ESTIMATED BLOOD LOSS: <1 mL   COMPLICATIONS:  None.   DESCRIPTION OF PROCEDURE:  The patient was identified in the holding room and transported to the operating room and placed in the supine position under the operating microscope.  The left eye was identified as the operative eye and it was prepped and draped in the usual sterile ophthalmic fashion.   A 1.0 millimeter clear-corneal paracentesis was made at the 5:00 position. 0.5 ml of preservative-free 1% lidocaine with epinephrine was injected into the anterior chamber.  The anterior chamber was filled with Healon 5 viscoelastic.  A 2.4 millimeter keratome was used to make a near-clear corneal incision at the 2:00 position.  A curvilinear capsulorrhexis was made with a cystotome and capsulorrhexis forceps.  Balanced salt solution was used to hydrodissect and hydrodelineate the nucleus.   Phacoemulsification was then used in stop and chop fashion to remove the lens nucleus and epinucleus.  The remaining cortex was then removed using the irrigation and aspiration handpiece. Healon was then placed into the capsular bag to distend it for lens placement.  A lens was then injected into the  capsular bag.  The remaining viscoelastic was aspirated.   Wounds were hydrated with balanced salt solution.  The anterior chamber was inflated to a physiologic pressure with balanced salt solution.  Intracameral vigamox 0.1 mL undiltued was injected into the eye and a drop placed onto the ocular surface.  No wound leaks were noted.  The patient was taken to the recovery room in stable condition without complications of anesthesia or surgery  Benay Pillow 07/01/2021, 10:11 AM

## 2021-07-01 NOTE — H&P (Signed)
[facility]   Primary Care Physician:  [name], MD Ophthalmologist: Dr. Benay Pillow  Pre-Procedure History & Physical: HPI:  Johnny Casteneda. is a 71 y.o. male here for cataract surgery.   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   Peripheral vascular disease (H[facility])    S/P BKA (below knee amputation), right ([place])    Wears dentures    full upper and lower    Past Surgical History:  Procedure Laterality Date   A/V FISTULAGRAM Right 09/06/2018   Procedure: A/V FISTULAGRAM;  Surgeon: Algernon Huxley, MD;  Location: [place] [facility];  Service: Cardiovascular;  Laterality: Right;   A/V SHUNTOGRAM Left 06/21/2017   Procedure: A/V SHUNTOGRAM;  Surgeon: Katha Cabal, MD;  Location: [place] [facility];  Service: Cardiovascular;  Laterality: Left;   A/V SHUNTOGRAM N/A 10/24/2018   Procedure: A/V SHUNTOGRAM;  Surgeon: Algernon Huxley, MD;  Location: [place] [facility];  Service: Cardiovascular;  Laterality: N/A;   ABOVE KNEE LEG AMPUTATION Right 2020   AMPUTATION Right 10/25/2018   Procedure: AMPUTATION ABOVE KNEE;  Surgeon: Algernon Huxley, MD;  Location: [facility];  Service: General;  Laterality: Right;   APPLICATION OF WOUND VAC Right 04/11/2018   Procedure: APPLICATION OF WOUND VAC;  Surgeon: Algernon Huxley, MD;  Location: [facility];  Service: Vascular;  Laterality: Right;   AV FISTULA PLACEMENT Left 09/18/2015   Procedure: INSERTION OF ARTERIOVENOUS (AV) GORE-TEX GRAFT ARM ( BRACH/AXILLARY GRAFT W/ INSTANT STICK GRAFT );  Surgeon: Katha Cabal, MD;  Location: [facility];  Service: Vascular;  Laterality: Left;   AV FISTULA PLACEMENT Right 07/19/2018    Procedure: INSERTION OF ARTERIOVENOUS (AV) GORE-TEX GRAFT ARM ( BRACHIAL AXILLARY);  Surgeon: Algernon Huxley, MD;  Location: [facility];  Service: Vascular;  Laterality: Right;   DIALYSIS FISTULA CREATION Right 10/2017   right chest perm cath   DIALYSIS/PERMA CATHETER INSERTION N/A 06/09/2020   Procedure: DIALYSIS/PERMA CATHETER INSERTION;  Surgeon: Algernon Huxley, MD;  Location: [place] [facility];  Service: Cardiovascular;  Laterality: N/A;   DIALYSIS/PERMA CATHETER INSERTION N/A 07/30/2020   Procedure: DIALYSIS/PERMA CATHETER EXCHANGE;  Surgeon: Algernon Huxley, MD;  Location: [name] [facility];  Service: Cardiovascular;  Laterality: N/A;   DIALYSIS/PERMA CATHETER REMOVAL N/A 09/13/2018   Procedure: DIALYSIS/PERMA CATHETER REMOVAL;  Surgeon: Algernon Huxley, MD;  Location: [place] [facility];  Service: Cardiovascular;  Laterality: N/A;   ESOPHAGOGASTRODUODENOSCOPY N/A 12/19/2017   Procedure: ESOPHAGOGASTRODUODENOSCOPY (EGD);  Surgeon: Lin Landsman, MD;  [facility] END[facility]COPY;  Service: Gastroenterology;  Laterality: N/A;   LOWER EXTREMITY ANGIOGRAPHY Left 11/16/2017   Procedure: LOWER EXTREMITY ANGIOGRAPHY;  Surgeon: Algernon Huxley, MD;  Location: [place] [facility];  Service: Cardiovascular;  Laterality: Left;   LOWER EXTREMITY ANGIOGRAPHY Right 01/18/2018   Procedure: LOWER EXTREMITY ANGIOGRAPHY;  Surgeon: Algernon Huxley, MD;  Location: [place] [facility];  Service: Cardiovascular;  Laterality: Right;   LOWER EXTREMITY ANGIOGRAPHY Left 04/02/2018   Procedure: LOWER EXTREMITY ANGIOGRAPHY;  Surgeon: Algernon Huxley, MD;  Location: [place] [facility];  Service: Cardiovascular;  Laterality: Left;   LOWER EXTREMITY ANGIOGRAPHY Right 04/09/2018   Procedure: Lower Extremity Angiography with possible intervention;  Surgeon: Algernon Huxley, MD;  Location: [place] [facility];  Service: Cardiovascular;  Laterality: Right;   LOWER EXTREMITY ANGIOGRAPHY Right 07/23/2018   Procedure: Lower Extremity  Angiography;  Surgeon: Algernon Huxley, MD;  Location: [place] [facility];  Service: Cardiovascular;  Laterality: Right;   LOWER EXTREMITY ANGIOGRAPHY Right 09/13/2018   Procedure: LOWER EXTREMITY ANGIOGRAPHY;  Surgeon: Algernon Huxley, MD;  Location: [place] [facility];  Service: Cardiovascular;  Laterality: Right;   LOWER EXTREMITY VENOGRAPHY Right 09/13/2018   Procedure: LOWER EXTREMITY VENOGRAPHY;  Surgeon: Algernon Huxley, MD;  Location: [place] [facility];  Service: Cardiovascular;  Laterality: Right;   PERIPHERAL VASCULAR CATHETERIZATION Left 09/01/2015   Procedure: A/V Shuntogram/Fistulagram;  Surgeon: Katha Cabal, MD;  Location: [place] [facility];  Service: Cardiovascular;  Laterality: Left;   PERIPHERAL VASCULAR CATHETERIZATION N/A 09/30/2015   Procedure: A/V Shuntogram/Fistulagram with perm cathether removal;  Surgeon: Algernon Huxley, MD;  Location: [place] [facility];  Service: Cardiovascular;  Laterality: N/A;   PERIPHERAL VASCULAR CATHETERIZATION Left 09/30/2015   Procedure: A/V Shunt Intervention;  Surgeon: Algernon Huxley, MD;  Location: [place] [facility];  Service: Cardiovascular;  Laterality: Left;   PERIPHERAL VASCULAR CATHETERIZATION Left 12/03/2015   Procedure: Thrombectomy;  Surgeon: Algernon Huxley, MD;  Location: [place] [facility];  Service: Cardiovascular;  Laterality: Left;   PERIPHERAL VASCULAR CATHETERIZATION Left 01/28/2016   Procedure: Thrombectomy;  Surgeon: Algernon Huxley, MD;  Location: [place] [facility];  Service: Cardiovascular;  Laterality: Left;   PERIPHERAL VASCULAR CATHETERIZATION N/A 01/28/2016   Procedure: A/V Shuntogram/Fistulagram;  Surgeon: Algernon Huxley, MD;  Location: [place] [facility];  Service: Cardiovascular;  Laterality: N/A;   SKIN SPLIT GRAFT Right 05/24/2018   Procedure: SKIN GRAFT SPLIT THICKNESS ( RIGHT CALF);  Surgeon: Algernon Huxley, MD;  Location: [facility];  Service: Vascular;  Laterality: Right;   UPPER EXTREMITY ANGIOGRAPHY  10/24/2018    Procedure: Upper Extremity Angiography;  Surgeon: Algernon Huxley, MD;  Location: [place] [facility];  Service: Cardiovascular;;   UPPER EXTREMITY ANGIOGRAPHY Right 02/14/2019   Procedure: UPPER EXTREMITY ANGIOGRAPHY;  Surgeon: Algernon Huxley, MD;  Location: [place] [facility];  Service: Cardiovascular;  Laterality: Right;   UPPER EXTREMITY ANGIOGRAPHY Right 04/20/2020   Procedure: UPPER EXTREMITY ANGIOGRAPHY;  Surgeon: Algernon Huxley, MD;  Location: [place] [facility];  Service: Cardiovascular;  Laterality: Right;   WOUND DEBRIDEMENT Right 04/11/2018   Procedure: DEBRIDEMENT WOUND calf muscle and skin;  Surgeon: Algernon Huxley, MD;  Location: [facility];  Service: Vascular;  Laterality: Right;    Prior to Admission medications   Medication Sig Start Date End Date Taking? Authorizing Provider  allopurinol (ZYLOPRIM) 100 MG tablet Take 100 mg by mouth every evening.   Yes [provider]  aspirin EC 81 MG tablet Take 1 tablet (81 mg total) by mouth daily. 04/20/20  Yes Dew, Erskine Squibb, MD  atorvastatin (LIPITOR) 10 MG tablet Take 10 mg by mouth at bedtime. 11/23/17  Yes [provider]  carvedilol (COREG) 3.125 MG tablet Take 3.125 mg by mouth 2 (two) times daily. 05/24/21  Yes [provider]  clonazePAM (KLONOPIN) 0.5 MG tablet Take 1 tablet (0.5 mg total) by mouth as needed. Patient taking differently: Take 0.5 mg by mouth  2 (two) times daily. 07/26/18  Yes Mody, Ulice Bold, MD  clopidogrel (PLAVIX) 75 MG tablet Take 1 tablet (75 mg total) by mouth daily. 04/02/18  Yes Dew, Erskine Squibb, MD  folic acid-vitamin b complex-vitamin c-selenium-zinc (DIALYVITE) 3 MG TABS tablet Take 1 tablet by mouth daily.    Yes [provider]  gabapentin (NEURONTIN) 100 MG capsule Take 2 capsules (200 mg total) by mouth 2 (two) times daily. After dialysis session Patient taking differently: Take 100 mg by mouth See admin instructions. Take 100 mg twice a day and on MON. Wed and Friday take and  addition 100 mg in the evening after dialysis 06/28/20  Yes Enzo Bi, MD  losartan (COZAAR) 100 MG tablet Hold until followup with outpatient doctor due to intermittently low blood pressure. Patient taking differently: Take 100 mg by mouth every evening. 06/28/20  Yes Enzo Bi, MD  sevelamer carbonate (RENVELA) 800 MG tablet Take 2,400 mg by mouth 2 (two) times daily. 09/20/18  Yes [provider]  vitamin C (ASCORBIC ACID) 500 MG tablet Take 500 mg by mouth daily.   Yes [provider]  hydrALAZINE (APRESOLINE) 25 MG tablet Hold until followup with outpatient doctor due to intermittently low blood pressure. Patient not taking: Reported on 06/29/2021 06/28/20   Enzo Bi, MD    Allergies as of 04/06/2021 - Review Complete 03/10/2021  Allergen Reaction Noted   Shellfish allergy Anaphylaxis 09/01/2015   Other  11/30/2018    Family History  Problem Relation Age of Onset   Hypertension Other    Heart disease Other    Diabetes Mother     Social History   Socioeconomic History   Marital status: Single    Spouse name: Not on file   Number of children: 0   Years of education: Not on file   Highest education level: Not on file  Occupational History   Occupation: retired    Comment: Medical laboratory scientific officer  Tobacco Use   Smoking status: Former    Types: Cigarettes    Quit date: 05/17/2005    Years since quitting: 16.1   Smokeless tobacco: Never  Vaping Use   Vaping Use: Never used  Substance and Sexual Activity   Alcohol use: No   Drug use: No   Sexual activity: Not Currently  Other Topics Concern   Not on file  Social History Narrative   Not on file   Social Determinants of Health   Financial Resource Strain: Not on file  Food Insecurity: Not on file  Transportation Needs: Not on file  Physical Activity: Not on file  Stress: Not on file  Social Connections: Not on file  Intimate Partner Violence: Not on file    Review of Systems: See HPI,  otherwise negative ROS  Physical Exam: BP 124/61    Pulse 97    Temp 98 F (36.7 C) (Temporal)    Resp 18    Ht 5\' 10"  (1.778 m)    Wt 76.7 kg    SpO2 95%    BMI 24.25 kg/m  General:   Alert, cooperative in NAD Head:  Normocephalic and atraumatic. Respiratory:  Normal work of breathing. Cardiovascular:  RRR  Impression/Plan: Johnny Bucco. is here for cataract surgery.  Risks, benefits, limitations, and alternatives regarding cataract surgery have been reviewed with the patient.  Questions have been answered.  All parties agreeable.   Benay Pillow, MD  07/01/2021, 9:37 AM

## 2021-07-01 NOTE — Anesthesia Postprocedure Evaluation (Signed)
Anesthesia Post Note  Patient: Johnny [name].  Procedure(s) Performed: CATARACT EXTRACTION PHACO AND INTRAOCULAR LENS PLACEMENT (IOC) LEFT 7.92 00:47.3 (Left: Eye)  Patient location during evaluation: PACU Anesthesia Type: MAC Level of consciousness: awake and alert Pain management: pain level controlled Vital Signs Assessment: post-procedure vital signs reviewed and stable Respiratory status: spontaneous breathing, nonlabored ventilation and respiratory function stable Cardiovascular status: stable and blood pressure returned to baseline Postop Assessment: no apparent nausea or vomiting Anesthetic complications: no   No notable events documented.   Last Vitals:  Vitals:   07/01/21 1008 07/01/21 1010  BP:  124/69  Pulse: 84 85  Resp: 16 16  Temp:    SpO2: 98% 98%    Last Pain:  Vitals:   07/01/21 0806  TempSrc: Temporal  PainSc: 0-No pain                 [place] Ouch

## 2021-07-01 NOTE — Transfer of Care (Signed)
Immediate Anesthesia Transfer of Care Note  Patient: Johnny [name].  Procedure(s) Performed: CATARACT EXTRACTION PHACO AND INTRAOCULAR LENS PLACEMENT (IOC) LEFT 7.92 00:47.3 (Left: Eye)  Patient Location: SDS phase II  Anesthesia Type:MAC  Level of Consciousness: awake  Airway & Oxygen Therapy: Patient Spontanous Breathing  Post-op Assessment: Report given to RN and Post -op Vital signs reviewed and stable  Post vital signs: Reviewed and stable  Last Vitals:  Vitals Value Taken Time  BP 124/69 07/01/21 1010  Temp    Pulse 85 07/01/21 1010  Resp 16 07/01/21 1010  SpO2 98 % 07/01/21 1010    Last Pain:  Vitals:   07/01/21 0806  TempSrc: Temporal  PainSc: 0-No pain         Complications: No notable events documented.

## 2021-07-01 NOTE — Discharge Instructions (Signed)
Eye Surgery Discharge Instructions    Expect mild scratchy sensation or mild soreness. DO NOT RUB YOUR EYE!  The day of surgery: Minimal physical activity, but bed rest is not required No reading, computer work, or close hand work No bending, lifting, or straining. May watch TV  For 24 hours: No driving, legal decisions, or alcoholic beverages Safety precautions Eat anything you prefer: It is better to start with liquids, then soup then solid foods. _____ Eye patch should be worn until postoperative exam tomorrow. ____ Solar shield eyeglasses should be worn for comfort in the sunlight/patch while sleeping  Resume all regular medications including aspirin or Coumadin if these were discontinued prior to surgery. You may shower, bathe, shave, or wash your hair. Tylenol may be taken for mild discomfort.  Call your doctor if you experience significant pain, nausea, or vomiting, fever > 101 or other signs of infection. [phone] or [phone] Specific instructions:   Follow-up Information     Johnny Bear, MD Follow up.   Specialty: Ophthalmology Why: as schedule Contact information: [address] [place] [address] [phone]

## 2021-07-01 NOTE — Anesthesia Preprocedure Evaluation (Signed)
Anesthesia Evaluation  Patient identified by MRN, date of birth, ID band Patient awake    Reviewed: Allergy & Precautions, NPO status , Patient's Chart, lab work & pertinent test results, reviewed documented beta blocker date and time   History of Anesthesia Complications Negative for: history of anesthetic complications  Airway Mallampati: III  TM Distance: >3 FB Neck ROM: full    Dental  (+) Upper Dentures, Lower Dentures   Pulmonary former smoker,    Pulmonary exam normal        Cardiovascular Exercise Tolerance: Poor METS: < 3 Mets hypertension, Pt. 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) negative psych ROS   GI/Hepatic negative GI ROS, Neg liver ROS,   Endo/Other  negative endocrine ROS  Renal/GU ESRF and DialysisRenal disease (HD 2/22)     Musculoskeletal   Abdominal Normal abdominal exam  (+)   Peds  Hematology  (+) Blood dyscrasia, anemia ,   Anesthesia Other Findings Past Medical History: No date: Anemia No date: Anxiety No date: CHF (congestive heart failure) (HCC) No date: Chronic kidney disease     Comment:  esrd dialysis m/w/f No date: Dialysis patient [facility])     Comment:  M, W, F No date: Gout No date: Hyperlipidemia No date: Hypertension 2010: Myocardial infarction [facility])     Comment:  10 years ago 2020: Neuromuscular disorder ([place])     Comment:  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 No date: Peripheral vascular disease ([place]) No date: S/P BKA (below knee amputation), right ([place]) No date: Wears dentures     Comment:  full upper and lower  Past Surgical History: 09/06/2018: A/V FISTULAGRAM; Right     Comment:  Procedure: A/V FISTULAGRAM;  Surgeon: Algernon Huxley, MD;               Location: [place] [facility]  LAB;  Service: Cardiovascular;              Laterality: Right; 06/21/2017: A/V SHUNTOGRAM; Left     Comment:  Procedure: A/V SHUNTOGRAM;  Surgeon: Katha Cabal,              MD;  Location: [place] [facility] LAB;  Service:               Cardiovascular;  Laterality: Left; 10/24/2018: A/V SHUNTOGRAM; N/A     Comment:  Procedure: A/V SHUNTOGRAM;  Surgeon: Algernon Huxley, MD;                Location: [place] [facility];  Service: Cardiovascular;              Laterality: N/A; 2020: ABOVE KNEE LEG AMPUTATION; Right 10/25/2018: AMPUTATION; Right     Comment:  Procedure: AMPUTATION ABOVE KNEE;  Surgeon: Algernon Huxley, MD;  Location: [facility];  Service: General;                Laterality: Right; 57/01/7281: APPLICATION OF WOUND VAC; Right     Comment:  Procedure: APPLICATION OF WOUND VAC;  Surgeon: Algernon Huxley, MD;  Location: [facility] OR[facility];  Service: Vascular;  Laterality: Right; 09/18/2015: AV FISTULA PLACEMENT; Left     Comment:  Procedure: INSERTION OF ARTERIOVENOUS (AV) GORE-TEX               GRAFT ARM ( BRACH/AXILLARY GRAFT W/ INSTANT STICK GRAFT               );  Surgeon: Katha Cabal, MD;  Location: [facility] OR[facility];               Service: Vascular;  Laterality: Left; 07/19/2018: AV FISTULA PLACEMENT; Right     Comment:  Procedure: INSERTION OF ARTERIOVENOUS (AV) GORE-TEX               GRAFT ARM ( BRACHIAL AXILLARY);  Surgeon: Algernon Huxley,               MD;  Location: [facility] OR[facility];  Service: Vascular;  Laterality:              Right; 10/2017: DIALYSIS FISTULA CREATION; Right     Comment:  right chest perm cath 06/09/2020: DIALYSIS/PERMA CATHETER INSERTION; N/A     Comment:  Procedure: DIALYSIS/PERMA CATHETER INSERTION;  Surgeon:               Algernon Huxley, MD;  Location: [place] [facility] LAB;                Service: Cardiovascular;  Laterality: N/A; 07/30/2020: DIALYSIS/PERMA CATHETER INSERTION; N/A     Comment:  Procedure: DIALYSIS/PERMA CATHETER EXCHANGE;   Surgeon:               Algernon Huxley, MD;  Location: [place] [facility] LAB;                Service: Cardiovascular;  Laterality: N/A; 09/13/2018: DIALYSIS/PERMA CATHETER REMOVAL; N/A     Comment:  Procedure: DIALYSIS/PERMA CATHETER REMOVAL;  Surgeon:               Algernon Huxley, MD;  Location: [place] [facility];                Service: Cardiovascular;  Laterality: N/A; 12/19/2017: ESOPHAGOGASTRODUODENOSCOPY; N/A     Comment:  Procedure: ESOPHAGOGASTRODUODENOSCOPY (EGD);  Surgeon:               Lin Landsman, MD;  Location: [facility] ENDOSCOPY;                Service: Gastroenterology;  Laterality: N/A; 11/16/2017: LOWER EXTREMITY ANGIOGRAPHY; Left     Comment:  Procedure: LOWER EXTREMITY ANGIOGRAPHY;  Surgeon: Algernon Huxley, MD;  Location: [place] [facility];  Service:               Cardiovascular;  Laterality: Left; 01/18/2018: LOWER EXTREMITY ANGIOGRAPHY; Right     Comment:  Procedure: LOWER EXTREMITY ANGIOGRAPHY;  Surgeon: Algernon Huxley, MD;  Location: [place] [facility];  Service:               Cardiovascular;  Laterality: Right; 04/02/2018: LOWER EXTREMITY ANGIOGRAPHY; Left     Comment:  Procedure: LOWER EXTREMITY ANGIOGRAPHY;  Surgeon: Algernon Huxley, MD;  Location: [place] [facility];  Service:               Cardiovascular;  Laterality: Left; 04/09/2018: LOWER EXTREMITY ANGIOGRAPHY; Right     Comment:  Procedure: Lower Extremity Angiography with possible               intervention;  Surgeon: Algernon Huxley, MD;  Location: [place] [facility] LAB;  Service: Cardiovascular;  Laterality:               Right; 07/23/2018: LOWER EXTREMITY ANGIOGRAPHY; Right     Comment:  Procedure: Lower Extremity Angiography;  Surgeon: Algernon Huxley, MD;  Location: [place] [facility];  Service:               Cardiovascular;  Laterality: Right; 09/13/2018: LOWER EXTREMITY ANGIOGRAPHY; Right     Comment:  Procedure: LOWER EXTREMITY  ANGIOGRAPHY;  Surgeon: Algernon Huxley, MD;  Location: [place] [facility];  Service:               Cardiovascular;  Laterality: Right; 09/13/2018: LOWER EXTREMITY VENOGRAPHY; Right     Comment:  Procedure: LOWER EXTREMITY VENOGRAPHY;  Surgeon: Algernon Huxley, MD;  Location: [place] [facility];  Service:               Cardiovascular;  Laterality: Right; 09/01/2015: PERIPHERAL VASCULAR CATHETERIZATION; Left     Comment:  Procedure: A/V Shuntogram/Fistulagram;  Surgeon: Katha Cabal, MD;  Location: [place] [facility];  Service:              Cardiovascular;  Laterality: Left; 09/30/2015: PERIPHERAL VASCULAR CATHETERIZATION; N/A     Comment:  Procedure: A/V Shuntogram/Fistulagram with perm               cathether removal;  Surgeon: Algernon Huxley, MD;  Location:               [place] [facility];  Service: Cardiovascular;                Laterality: N/A; 09/30/2015: PERIPHERAL VASCULAR CATHETERIZATION; Left     Comment:  Procedure: A/V Shunt Intervention;  Surgeon: Algernon Huxley, MD;  Location: [place] [facility];  Service:               Cardiovascular;  Laterality: Left; 12/03/2015: PERIPHERAL VASCULAR CATHETERIZATION; Left     Comment:  Procedure: Thrombectomy;  Surgeon: Algernon Huxley, MD;                Location: [place] [facility];  Service: Cardiovascular;              Laterality: Left; 01/28/2016: PERIPHERAL VASCULAR CATHETERIZATION; Left     Comment:  Procedure: Thrombectomy;  Surgeon: Algernon Huxley, MD;                Location: [place] [facility] LAB;  Service: Cardiovascular;              Laterality: Left; 01/28/2016: PERIPHERAL VASCULAR CATHETERIZATION; N/A     Comment:  Procedure: A/V Shuntogram/Fistulagram;  Surgeon: Erskine Squibb  Lucky Cowboy, MD;  Location: [place] [facility];  Service:               Cardiovascular;  Laterality: N/A; 05/24/2018: SKIN SPLIT GRAFT; Right     Comment:  Procedure: SKIN GRAFT SPLIT THICKNESS ( RIGHT  CALF);                Surgeon: Algernon Huxley, MD;  Location: [facility];  Service:              Vascular;  Laterality: Right; 10/24/2018: UPPER EXTREMITY ANGIOGRAPHY     Comment:  Procedure: Upper Extremity Angiography;  Surgeon: Algernon Huxley, MD;  Location: [place] [facility];  Service:               Cardiovascular;; 02/14/2019: UPPER EXTREMITY ANGIOGRAPHY; Right     Comment:  Procedure: UPPER EXTREMITY ANGIOGRAPHY;  Surgeon: Algernon Huxley, MD;  Location: [place] [facility];  Service:               Cardiovascular;  Laterality: Right; 04/20/2020: UPPER EXTREMITY ANGIOGRAPHY; Right     Comment:  Procedure: UPPER EXTREMITY ANGIOGRAPHY;  Surgeon: Algernon Huxley, MD;  Location: [place] [facility];  Service:               Cardiovascular;  Laterality: Right; 04/11/2018: WOUND DEBRIDEMENT; Right     Comment:  Procedure: DEBRIDEMENT WOUND calf muscle and skin;                Surgeon: Algernon Huxley, MD;  Location: [facility];  Service:              Vascular;  Laterality: Right;  BMI    Body Mass Index: 24.25 kg/m      Reproductive/Obstetrics negative OB ROS                             Anesthesia Physical Anesthesia Plan  ASA: 3  Anesthesia Plan: MAC   Post-op Pain Management:    Induction: Intravenous  PONV Risk Score and Plan:   Airway Management Planned: Nasal Cannula and Natural Airway  Additional Equipment:   Intra-op Plan:   Post-operative Plan:   Informed Consent: I have reviewed the patients History and Physical, chart, labs and discussed the procedure including the risks, benefits and alternatives for the proposed anesthesia with the patient or authorized representative who has indicated his/her understanding and acceptance.     Dental advisory given  Plan Discussed with: Anesthesiologist, CRNA and Surgeon  Anesthesia Plan Comments:         Anesthesia Quick Evaluation

## 2021-07-02 ENCOUNTER — Encounter: Payer: Self-pay | Admitting: Ophthalmology

## 2021-07-27 ENCOUNTER — Encounter: Payer: Self-pay | Admitting: Ophthalmology

## 2021-08-19 ENCOUNTER — Other Ambulatory Visit: Payer: Self-pay

## 2021-08-19 ENCOUNTER — Emergency Department: Payer: Medicare Other

## 2021-08-19 ENCOUNTER — Inpatient Hospital Stay
Admission: EM | Admit: 2021-08-19 | Discharge: 2021-08-21 | DRG: 270 | Disposition: A | Discharge status: Home / Self Care | Payer: Medicare Other | Attending: Internal Medicine | Admitting: Internal Medicine

## 2021-08-19 DIAGNOSIS — N186 End stage renal disease: Secondary | ICD-10-CM

## 2021-08-19 DIAGNOSIS — S78111A Complete traumatic amputation at level between right hip and knee, initial encounter: Secondary | ICD-10-CM

## 2021-08-19 DIAGNOSIS — I132 Hypertensive heart and chronic kidney disease with heart failure and with stage 5 chronic kidney disease, or end stage renal disease: Secondary | ICD-10-CM | POA: Diagnosis present

## 2021-08-19 DIAGNOSIS — M79605 Pain in left leg: Secondary | ICD-10-CM | POA: Diagnosis not present

## 2021-08-19 DIAGNOSIS — I10 Essential (primary) hypertension: Secondary | ICD-10-CM | POA: Diagnosis present

## 2021-08-19 DIAGNOSIS — Z9861 Coronary angioplasty status: Secondary | ICD-10-CM

## 2021-08-19 DIAGNOSIS — E785 Hyperlipidemia, unspecified: Secondary | ICD-10-CM | POA: Diagnosis present

## 2021-08-19 DIAGNOSIS — Z89611 Acquired absence of right leg above knee: Secondary | ICD-10-CM

## 2021-08-19 DIAGNOSIS — Z79899 Other long term (current) drug therapy: Secondary | ICD-10-CM

## 2021-08-19 DIAGNOSIS — I743 Embolism and thrombosis of arteries of the lower extremities: Secondary | ICD-10-CM | POA: Diagnosis present

## 2021-08-19 DIAGNOSIS — Y831 Surgical operation with implant of artificial internal device as the cause of abnormal reaction of the patient, or of later complication, without mention of misadventure at the time of the procedure: Secondary | ICD-10-CM | POA: Diagnosis present

## 2021-08-19 DIAGNOSIS — Z91013 Allergy to seafood: Secondary | ICD-10-CM

## 2021-08-19 DIAGNOSIS — D631 Anemia in chronic kidney disease: Secondary | ICD-10-CM | POA: Diagnosis present

## 2021-08-19 DIAGNOSIS — B351 Tinea unguium: Secondary | ICD-10-CM

## 2021-08-19 DIAGNOSIS — Z7902 Long term (current) use of antithrombotics/antiplatelets: Secondary | ICD-10-CM

## 2021-08-19 DIAGNOSIS — T82868A Thrombosis of vascular prosthetic devices, implants and grafts, initial encounter: Secondary | ICD-10-CM | POA: Diagnosis not present

## 2021-08-19 DIAGNOSIS — Z7982 Long term (current) use of aspirin: Secondary | ICD-10-CM

## 2021-08-19 DIAGNOSIS — G629 Polyneuropathy, unspecified: Secondary | ICD-10-CM | POA: Diagnosis present

## 2021-08-19 DIAGNOSIS — F419 Anxiety disorder, unspecified: Secondary | ICD-10-CM | POA: Diagnosis present

## 2021-08-19 DIAGNOSIS — Z992 Dependence on renal dialysis: Secondary | ICD-10-CM

## 2021-08-19 DIAGNOSIS — I70222 Atherosclerosis of native arteries of extremities with rest pain, left leg: Secondary | ICD-10-CM | POA: Diagnosis present

## 2021-08-19 DIAGNOSIS — Z833 Family history of diabetes mellitus: Secondary | ICD-10-CM

## 2021-08-19 DIAGNOSIS — Z87891 Personal history of nicotine dependence: Secondary | ICD-10-CM

## 2021-08-19 DIAGNOSIS — I252 Old myocardial infarction: Secondary | ICD-10-CM

## 2021-08-19 DIAGNOSIS — I5032 Chronic diastolic (congestive) heart failure: Secondary | ICD-10-CM | POA: Diagnosis present

## 2021-08-19 DIAGNOSIS — N2581 Secondary hyperparathyroidism of renal origin: Secondary | ICD-10-CM | POA: Diagnosis present

## 2021-08-19 DIAGNOSIS — M109 Gout, unspecified: Secondary | ICD-10-CM | POA: Diagnosis present

## 2021-08-19 DIAGNOSIS — I739 Peripheral vascular disease, unspecified: Secondary | ICD-10-CM

## 2021-08-19 DIAGNOSIS — I2510 Atherosclerotic heart disease of native coronary artery without angina pectoris: Secondary | ICD-10-CM | POA: Diagnosis present

## 2021-08-19 DIAGNOSIS — Z8249 Family history of ischemic heart disease and other diseases of the circulatory system: Secondary | ICD-10-CM

## 2021-08-19 DIAGNOSIS — I998 Other disorder of circulatory system: Secondary | ICD-10-CM | POA: Diagnosis present

## 2021-08-19 LAB — CBC WITH DIFFERENTIAL/PLATELET
Abs Immature Granulocytes: 0.01 10*3/uL (ref 0.00–0.07)
Basophils Absolute: 0.1 10*3/uL (ref 0.0–0.1)
Basophils Relative: 1 %
Eosinophils Absolute: 0.2 10*3/uL (ref 0.0–0.5)
Eosinophils Relative: 3 %
HCT: 35.6 % — ABNORMAL LOW (ref 39.0–52.0)
Hemoglobin: 10.6 g/dL — ABNORMAL LOW (ref 13.0–17.0)
Immature Granulocytes: 0 %
Lymphocytes Relative: 20 %
Lymphs Abs: 1.6 10*3/uL (ref 0.7–4.0)
MCH: 29.4 pg (ref 26.0–34.0)
MCHC: 29.8 g/dL — ABNORMAL LOW (ref 30.0–36.0)
MCV: 98.9 fL (ref 80.0–100.0)
Monocytes Absolute: 0.7 10*3/uL (ref 0.1–1.0)
Monocytes Relative: 9 %
Neutro Abs: 5.5 10*3/uL (ref 1.7–7.7)
Neutrophils Relative %: 67 %
Platelets: 155 10*3/uL (ref 150–400)
RBC: 3.6 MIL/uL — ABNORMAL LOW (ref 4.22–5.81)
RDW: 15.9 % — ABNORMAL HIGH (ref 11.5–15.5)
WBC: 8.1 10*3/uL (ref 4.0–10.5)
nRBC: 0 % (ref 0.0–0.2)

## 2021-08-19 LAB — COMPREHENSIVE METABOLIC PANEL
ALT: 7 U/L (ref 0–44)
AST: 14 U/L — ABNORMAL LOW (ref 15–41)
Albumin: 3.5 g/dL (ref 3.5–5.0)
Alkaline Phosphatase: 67 U/L (ref 38–126)
Anion gap: 13 (ref 5–15)
BUN: 38 mg/dL — ABNORMAL HIGH (ref 8–23)
CO2: 24 mmol/L (ref 22–32)
Calcium: 8.4 mg/dL — ABNORMAL LOW (ref 8.9–10.3)
Chloride: 102 mmol/L (ref 98–111)
Creatinine, Ser: 8.51 mg/dL — ABNORMAL HIGH (ref 0.61–1.24)
GFR, Estimated: 6 mL/min — ABNORMAL LOW (ref >60)
Glucose, Bld: 98 mg/dL (ref 70–99)
Potassium: 4.6 mmol/L (ref 3.5–5.1)
Sodium: 139 mmol/L (ref 135–145)
Total Bilirubin: 0.6 mg/dL (ref 0.3–1.2)
Total Protein: 7.1 g/dL (ref 6.5–8.1)

## 2021-08-19 LAB — PROTIME-INR
INR: 1.1 (ref 0.8–1.2)
Prothrombin Time: 14.3 seconds (ref 11.4–15.2)

## 2021-08-19 MED ORDER — IOHEXOL 350 MG/ML SOLN
125.0000 mL | Freq: Once | INTRAVENOUS | Status: AC | PRN
  Administered 2021-08-19: 125 mL via INTRAVENOUS

## 2021-08-19 MED ORDER — OXYCODONE-ACETAMINOPHEN 5-325 MG PO TABS
1.0000 | ORAL_TABLET | Freq: Once | ORAL | Status: AC
  Administered 2021-08-19: 1 via ORAL
  Filled 2021-08-19: qty 1

## 2021-08-19 MED ORDER — IOHEXOL 350 MG/ML SOLN: 100.0000 mL | Freq: Once | INTRAVENOUS | Status: DC | PRN

## 2021-08-19 NOTE — ED Triage Notes (Signed)
Pt reports left sided lower leg pain and numbness. Hx of circulatory issues. States it feels like his right leg did prior to having to be amputated. No Pedal pulse found with doppler at this time.  ?

## 2021-08-19 NOTE — ED Notes (Signed)
This RN attempted IV insertion without success, EDP notified.  ?

## 2021-08-19 NOTE — ED Provider Notes (Signed)
? ?[facility] ?Provider Note ? ? ? Event Date/Time  ? First MD Initiated Contact with Patient 08/19/21 2220   ?  (approximate) ? ? ?History  ? ?foot numbness and Foot Pain ? ? ?HPI ? ?Johnny [name]. is a 71 y.o. 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  Patient denies any injuries or falls.  States he has not missed any recent dialysis.  No new chest pain, cough, fevers, headache, earache, sore throat, abdominal pain, back pain and does not recall any injuries.  He denies any hip pain or knee pain.  States this feels very similar to when he had to have his right lower extremity amputated. ? ?  ? ? ?Physical Exam  ?Triage Vital Signs: ?ED Triage Vitals  ?Enc Vitals Group  ?   BP 08/19/21 2156 128/82  ?   Pulse Rate 08/19/21 2156 97  ?   Resp 08/19/21 2156 17  ?   Temp 08/19/21 2156 99.4 ?F (37.4 ?C)  ?   Temp Source 08/19/21 2156 Oral  ?   SpO2 08/19/21 2156 95 %  ?   Weight --   ?   Height --   ?   Head Circumference --   ?   Peak Flow --   ?   Pain Score 08/19/21 2159 10  ?   Pain Loc --   ?   Pain Edu? --   ?   Excl. in [place]? --   ? ? ?Most recent vital signs: ?Vitals:  ? 08/19/21 2218 08/19/21 2245  ?BP: (!) 144/82   ?Pulse: 95 98  ?Resp: 18 (!) 24  ?Temp:    ?SpO2: 97% 98%  ? ? ?General: Awake, no distress.  ?CV:  2+ radial pulses.  I am unable to Doppler or palpate left PT pulses. ?Resp:  Normal effort.  ?Abd:  No distention.  ?Other:  Left foot has no erythema, induration, fluctuance, bleeding patient is able to move his toes only states that he was to be sent.  No focal areas of tenderness. ?. ?Status post right AKA. ? ? ? ?ED Results / Procedures / Treatments  ?Labs ?(all labs ordered are listed, but only abnormal results  are displayed) ?Labs Reviewed  ?COMPREHENSIVE METABOLIC PANEL - Abnormal; Notable for the following components:  ?    Result Value  ? BUN 38 (*)   ? Creatinine, Ser 8.51 (*)   ? Calcium 8.4 (*)   ? AST 14 (*)   ? GFR, Estimated 6 (*)   ? All other components within normal limits  ?CBC WITH DIFFERENTIAL/PLATELET - Abnormal; Notable for the following components:  ? RBC 3.60 (*)   ? Hemoglobin 10.6 (*)   ? HCT 35.6 (*)   ? MCHC 29.8 (*)   ? RDW 15.9 (*)   ? All other components within normal limits  ?PROTIME-INR  ? ? ? ?EKG ? ? ?RADIOLOGY ? ?CTA lower extremities on my interpretation without clear acute infectious process and extensive calcifications throughout vessels.  Pending formal radiology interpretation at time of signout for an occluded occlusion in the setting of fairly extensive and severe underlying PVD. ? ? ?PROCEDURES: ? ?Critical Care performed: Yes, see  critical care procedure note(s) ? ?.1-3 Lead EKG Interpretation ?Performed by: Lucrezia Starch, MD ?Authorized by: Lucrezia Starch, MD  ? ?  Interpretation: normal   ?  ECG rate assessment: normal   ?  Rhythm: sinus rhythm   ?  Ectopy: none   ?  Conduction: normal   ? ?The patient is on the cardiac monitor to evaluate for evidence of arrhythmia and/or significant heart rate changes. ? ? ?MEDICATIONS ORDERED IN ED: ?Medications  ?oxyCODONE-acetaminophen (PERCOCET/ROXICET) 5-325 MG per tablet 1 tablet (1 tablet Oral Given 08/19/21 2240)  ?iohexol (OMNIPAQUE) 350 MG/ML injection 125 mL (125 mLs Intravenous Contrast Given 08/19/21 2303)  ? ? ? ?IMPRESSION / MDM / ASSESSMENT AND PLAN / ED COURSE  ?I reviewed the triage vital signs and the nursing notes. ?             ?               ? ?Differential diagnosis includes, but is not limited to acute arterial occlusion although it seems symptoms been ongoing for at least 2 days in size do not think this occurred in the last 24 hours, progression of peripheral neuropathy, DVT, with lower suspicion based on exam for  septic joint or cellulitis. ? ?INR is 1.1.  CBC shows no leukocytosis and hemoglobin of 10.6 compared to 12.65-month ago.  Normal platelets.  CMP shows creatinine and BUN consistent with known history of ESRD without any other significant electrolyte or metabolic derangements. ? ?Care patient signed over to assuming father approximately 2300.  Plan is to follow-up CT angio runoff study of lower extremities and disposition appropriately  ?  ? ? ?FINAL CLINICAL IMPRESSION(S) / ED DIAGNOSES  ? ?Final diagnoses:  ?Pain of left lower extremity  ? ? ? ?Rx / DC Orders  ? ?ED Discharge Orders   ? ? None  ? ?  ? ? ? ?Note:  This document was prepared using Dragon voice recognition software and may include unintentional dictation errors. ?  ?Lucrezia Starch, MD ?08/19/21 2339 ? ?

## 2021-08-19 NOTE — ED Notes (Signed)
Patient transported to CT 

## 2021-08-20 ENCOUNTER — Encounter: Payer: Self-pay | Admitting: Vascular Surgery

## 2021-08-20 ENCOUNTER — Inpatient Hospital Stay
Admission: EM | Disposition: A | Discharge status: Home / Self Care | Payer: Self-pay | Source: Home / Self Care | Attending: Internal Medicine

## 2021-08-20 DIAGNOSIS — B351 Tinea unguium: Secondary | ICD-10-CM | POA: Diagnosis present

## 2021-08-20 DIAGNOSIS — I70222 Atherosclerosis of native arteries of extremities with rest pain, left leg: Secondary | ICD-10-CM | POA: Diagnosis present

## 2021-08-20 DIAGNOSIS — I998 Other disorder of circulatory system: Secondary | ICD-10-CM | POA: Diagnosis present

## 2021-08-20 DIAGNOSIS — M109 Gout, unspecified: Secondary | ICD-10-CM | POA: Diagnosis present

## 2021-08-20 DIAGNOSIS — Z87891 Personal history of nicotine dependence: Secondary | ICD-10-CM | POA: Diagnosis not present

## 2021-08-20 DIAGNOSIS — Z7982 Long term (current) use of aspirin: Secondary | ICD-10-CM | POA: Diagnosis not present

## 2021-08-20 DIAGNOSIS — N2581 Secondary hyperparathyroidism of renal origin: Secondary | ICD-10-CM | POA: Diagnosis present

## 2021-08-20 DIAGNOSIS — T82868A Thrombosis of vascular prosthetic devices, implants and grafts, initial encounter: Secondary | ICD-10-CM | POA: Diagnosis present

## 2021-08-20 DIAGNOSIS — N186 End stage renal disease: Secondary | ICD-10-CM | POA: Diagnosis present

## 2021-08-20 DIAGNOSIS — I743 Embolism and thrombosis of arteries of the lower extremities: Secondary | ICD-10-CM | POA: Diagnosis present

## 2021-08-20 DIAGNOSIS — D631 Anemia in chronic kidney disease: Secondary | ICD-10-CM | POA: Diagnosis present

## 2021-08-20 DIAGNOSIS — G629 Polyneuropathy, unspecified: Secondary | ICD-10-CM | POA: Diagnosis present

## 2021-08-20 DIAGNOSIS — Z992 Dependence on renal dialysis: Secondary | ICD-10-CM

## 2021-08-20 DIAGNOSIS — Z91013 Allergy to seafood: Secondary | ICD-10-CM | POA: Diagnosis not present

## 2021-08-20 DIAGNOSIS — Z9861 Coronary angioplasty status: Secondary | ICD-10-CM | POA: Diagnosis not present

## 2021-08-20 DIAGNOSIS — I5032 Chronic diastolic (congestive) heart failure: Secondary | ICD-10-CM | POA: Diagnosis present

## 2021-08-20 DIAGNOSIS — Z89611 Acquired absence of right leg above knee: Secondary | ICD-10-CM | POA: Diagnosis not present

## 2021-08-20 DIAGNOSIS — I252 Old myocardial infarction: Secondary | ICD-10-CM | POA: Diagnosis not present

## 2021-08-20 DIAGNOSIS — I739 Peripheral vascular disease, unspecified: Secondary | ICD-10-CM

## 2021-08-20 DIAGNOSIS — Z8249 Family history of ischemic heart disease and other diseases of the circulatory system: Secondary | ICD-10-CM | POA: Diagnosis not present

## 2021-08-20 DIAGNOSIS — E785 Hyperlipidemia, unspecified: Secondary | ICD-10-CM | POA: Diagnosis present

## 2021-08-20 DIAGNOSIS — F419 Anxiety disorder, unspecified: Secondary | ICD-10-CM | POA: Diagnosis present

## 2021-08-20 DIAGNOSIS — M79605 Pain in left leg: Secondary | ICD-10-CM | POA: Diagnosis present

## 2021-08-20 DIAGNOSIS — I2510 Atherosclerotic heart disease of native coronary artery without angina pectoris: Secondary | ICD-10-CM | POA: Diagnosis present

## 2021-08-20 DIAGNOSIS — I132 Hypertensive heart and chronic kidney disease with heart failure and with stage 5 chronic kidney disease, or end stage renal disease: Secondary | ICD-10-CM | POA: Diagnosis present

## 2021-08-20 DIAGNOSIS — Z7902 Long term (current) use of antithrombotics/antiplatelets: Secondary | ICD-10-CM | POA: Diagnosis not present

## 2021-08-20 DIAGNOSIS — Z79899 Other long term (current) drug therapy: Secondary | ICD-10-CM | POA: Diagnosis not present

## 2021-08-20 DIAGNOSIS — Y831 Surgical operation with implant of artificial internal device as the cause of abnormal reaction of the patient, or of later complication, without mention of misadventure at the time of the procedure: Secondary | ICD-10-CM | POA: Diagnosis present

## 2021-08-20 HISTORY — PX: LOWER EXTREMITY ANGIOGRAPHY: CATH118251

## 2021-08-20 LAB — APTT: aPTT: 33 seconds (ref 24–36)

## 2021-08-20 LAB — RENAL FUNCTION PANEL
Albumin: 3.7 g/dL (ref 3.5–5.0)
Anion gap: 13 (ref 5–15)
BUN: 44 mg/dL — ABNORMAL HIGH (ref 8–23)
CO2: 22 mmol/L (ref 22–32)
Calcium: 8.2 mg/dL — ABNORMAL LOW (ref 8.9–10.3)
Chloride: 101 mmol/L (ref 98–111)
Creatinine, Ser: 9.64 mg/dL — ABNORMAL HIGH (ref 0.61–1.24)
GFR, Estimated: 5 mL/min — ABNORMAL LOW (ref >60)
Glucose, Bld: 94 mg/dL (ref 70–99)
Phosphorus: 6.8 mg/dL — ABNORMAL HIGH (ref 2.5–4.6)
Potassium: 6.2 mmol/L — ABNORMAL HIGH (ref 3.5–5.1)
Sodium: 136 mmol/L (ref 135–145)

## 2021-08-20 LAB — HEPARIN LEVEL (UNFRACTIONATED): Heparin Unfractionated: <0.1 IU/mL — ABNORMAL LOW (ref 0.30–0.70)

## 2021-08-20 LAB — CBC
HCT: 37.3 % — ABNORMAL LOW (ref 39.0–52.0)
Hemoglobin: 11.3 g/dL — ABNORMAL LOW (ref 13.0–17.0)
MCH: 29.8 pg (ref 26.0–34.0)
MCHC: 30.3 g/dL (ref 30.0–36.0)
MCV: 98.4 fL (ref 80.0–100.0)
Platelets: 186 10*3/uL (ref 150–400)
RBC: 3.79 MIL/uL — ABNORMAL LOW (ref 4.22–5.81)
RDW: 15.9 % — ABNORMAL HIGH (ref 11.5–15.5)
WBC: 10 10*3/uL (ref 4.0–10.5)
nRBC: 0 % (ref 0.0–0.2)

## 2021-08-20 LAB — HEPATITIS B SURFACE ANTIGEN: Hepatitis B Surface Ag: NONREACTIVE

## 2021-08-20 SURGERY — LOWER EXTREMITY ANGIOGRAPHY
Anesthesia: Moderate Sedation | Laterality: Left

## 2021-08-20 MED ORDER — NITROGLYCERIN 1 MG/10 ML FOR IR/CATH LAB
INTRA_ARTERIAL | Status: DC | PRN
  Administered 2021-08-20: 300 ug via INTRA_ARTERIAL
  Administered 2021-08-20: 400 ug via INTRA_ARTERIAL

## 2021-08-20 MED ORDER — PENTAFLUOROPROP-TETRAFLUOROETH EX AERO: 1.0000 "application " | INHALATION_SPRAY | CUTANEOUS | Status: DC | PRN

## 2021-08-20 MED ORDER — HEPARIN SODIUM (PORCINE) 1000 UNIT/ML IJ SOLN
INTRAMUSCULAR | Status: DC | PRN
  Administered 2021-08-20: 5000 [IU] via INTRAVENOUS

## 2021-08-20 MED ORDER — ONDANSETRON HCL 4 MG PO TABS: 4.0000 mg | ORAL_TABLET | Freq: Four times a day (QID) | ORAL | Status: DC | PRN

## 2021-08-20 MED ORDER — DIPHENHYDRAMINE HCL 50 MG/ML IJ SOLN: 50.0000 mg | Freq: Once | INTRAMUSCULAR | Status: DC | PRN

## 2021-08-20 MED ORDER — METHYLPREDNISOLONE SODIUM SUCC 125 MG IJ SOLR
INTRAMUSCULAR | Status: AC
  Administered 2021-08-20: 125 mg via INTRAVENOUS
  Filled 2021-08-20: qty 2

## 2021-08-20 MED ORDER — MORPHINE SULFATE (PF) 2 MG/ML IV SOLN
2.0000 mg | INTRAVENOUS | Status: DC | PRN
  Administered 2021-08-20 (×3): 2 mg via INTRAVENOUS
  Filled 2021-08-20 (×3): qty 1

## 2021-08-20 MED ORDER — DIPHENHYDRAMINE HCL 50 MG/ML IJ SOLN
INTRAMUSCULAR | Status: AC
  Administered 2021-08-20: 50 mg
  Filled 2021-08-20: qty 1

## 2021-08-20 MED ORDER — ACETAMINOPHEN 325 MG PO TABS: 650.0000 mg | ORAL_TABLET | Freq: Four times a day (QID) | ORAL | Status: DC | PRN

## 2021-08-20 MED ORDER — LABETALOL HCL 5 MG/ML IV SOLN
INTRAVENOUS | Status: AC
  Filled 2021-08-20: qty 4

## 2021-08-20 MED ORDER — HEPARIN (PORCINE) 25000 UT/250ML-% IV SOLN
1550.0000 [IU]/h | INTRAVENOUS | Status: DC
  Administered 2021-08-20: 1300 [IU]/h via INTRAVENOUS
  Filled 2021-08-20: qty 250

## 2021-08-20 MED ORDER — ALTEPLASE 2 MG IJ SOLR: 2.0000 mg | Freq: Once | INTRAMUSCULAR | Status: DC | PRN

## 2021-08-20 MED ORDER — MIDAZOLAM HCL 2 MG/2ML IJ SOLN
INTRAMUSCULAR | Status: AC
  Filled 2021-08-20: qty 4

## 2021-08-20 MED ORDER — ACETAMINOPHEN 650 MG RE SUPP: 650.0000 mg | Freq: Four times a day (QID) | RECTAL | Status: DC | PRN

## 2021-08-20 MED ORDER — MIDAZOLAM HCL 2 MG/2ML IJ SOLN
INTRAMUSCULAR | Status: DC | PRN
  Administered 2021-08-20: 1 mg via INTRAVENOUS

## 2021-08-20 MED ORDER — CEFAZOLIN SODIUM-DEXTROSE 1-4 GM/50ML-% IV SOLN
1.0000 g | INTRAVENOUS | Status: AC
  Administered 2021-08-20: 1 g via INTRAVENOUS
  Filled 2021-08-20: qty 50

## 2021-08-20 MED ORDER — TIROFIBAN HCL IV 12.5 MG/250 ML
0.0750 ug/kg/min | INTRAVENOUS | Status: AC
  Administered 2021-08-20: 0.075 ug/kg/min via INTRAVENOUS
  Filled 2021-08-20: qty 250

## 2021-08-20 MED ORDER — FAMOTIDINE 20 MG PO TABS
ORAL_TABLET | ORAL | Status: AC
  Administered 2021-08-20: 40 mg
  Filled 2021-08-20: qty 2

## 2021-08-20 MED ORDER — LIDOCAINE HCL (PF) 1 % IJ SOLN
5.0000 mL | INTRAMUSCULAR | Status: DC | PRN
  Filled 2021-08-20: qty 5

## 2021-08-20 MED ORDER — CHLORHEXIDINE GLUCONATE CLOTH 2 % EX PADS
6.0000 | MEDICATED_PAD | Freq: Every day | CUTANEOUS | Status: DC
  Administered 2021-08-21: 6 via TOPICAL

## 2021-08-20 MED ORDER — SODIUM CHLORIDE 0.9 % IV SOLN: INTRAVENOUS | Status: DC

## 2021-08-20 MED ORDER — GABAPENTIN 100 MG PO CAPS
100.0000 mg | ORAL_CAPSULE | ORAL | Status: DC
  Administered 2021-08-20: 100 mg via ORAL
  Filled 2021-08-20: qty 1

## 2021-08-20 MED ORDER — METHYLPREDNISOLONE SODIUM SUCC 125 MG IJ SOLR: 125.0000 mg | Freq: Once | INTRAMUSCULAR | Status: AC | PRN

## 2021-08-20 MED ORDER — HEPARIN SODIUM (PORCINE) 1000 UNIT/ML IJ SOLN
INTRAMUSCULAR | Status: AC
  Administered 2021-08-20: 3600 [IU] via INTRAVENOUS_CENTRAL
  Filled 2021-08-20: qty 10

## 2021-08-20 MED ORDER — CARVEDILOL 3.125 MG PO TABS
3.1250 mg | ORAL_TABLET | Freq: Two times a day (BID) | ORAL | Status: DC
  Administered 2021-08-20 – 2021-08-21 (×2): 3.125 mg via ORAL
  Filled 2021-08-20: qty 1
  Filled 2021-08-20: qty 0.5

## 2021-08-20 MED ORDER — LIDOCAINE-PRILOCAINE 2.5-2.5 % EX CREA: 1.0000 "application " | TOPICAL_CREAM | CUTANEOUS | Status: DC | PRN

## 2021-08-20 MED ORDER — ONDANSETRON HCL 4 MG/2ML IJ SOLN: 4.0000 mg | Freq: Four times a day (QID) | INTRAMUSCULAR | Status: DC | PRN

## 2021-08-20 MED ORDER — CEFAZOLIN SODIUM-DEXTROSE 1-4 GM/50ML-% IV SOLN
INTRAVENOUS | Status: AC
Start: 2021-08-20 — End: ?
  Filled 2021-08-20: qty 50

## 2021-08-20 MED ORDER — FAMOTIDINE 20 MG PO TABS
ORAL_TABLET | ORAL | Status: AC
  Filled 2021-08-20: qty 1

## 2021-08-20 MED ORDER — HEPARIN SODIUM (PORCINE) 1000 UNIT/ML DIALYSIS
1000.0000 [IU] | INTRAMUSCULAR | Status: DC | PRN
  Filled 2021-08-20 (×2): qty 1

## 2021-08-20 MED ORDER — LABETALOL HCL 5 MG/ML IV SOLN
INTRAVENOUS | Status: DC | PRN
Start: 2021-08-20 — End: 2021-08-20
  Administered 2021-08-20 (×2): 10 mg via INTRAVENOUS

## 2021-08-20 MED ORDER — GABAPENTIN 100 MG PO CAPS
100.0000 mg | ORAL_CAPSULE | Freq: Two times a day (BID) | ORAL | Status: DC
  Administered 2021-08-20 – 2021-08-21 (×2): 100 mg via ORAL
  Filled 2021-08-20 (×2): qty 1

## 2021-08-20 MED ORDER — FENTANYL CITRATE PF 50 MCG/ML IJ SOSY
PREFILLED_SYRINGE | INTRAMUSCULAR | Status: AC
  Filled 2021-08-20: qty 2

## 2021-08-20 MED ORDER — SODIUM CHLORIDE 0.9 % IV SOLN: 100.0000 mL | INTRAVENOUS | Status: DC | PRN

## 2021-08-20 MED ORDER — HEPARIN SODIUM (PORCINE) 1000 UNIT/ML IJ SOLN
INTRAMUSCULAR | Status: AC
  Filled 2021-08-20: qty 10

## 2021-08-20 MED ORDER — HEPARIN BOLUS VIA INFUSION
4500.0000 [IU] | Freq: Once | INTRAVENOUS | Status: AC
  Administered 2021-08-20: 4500 [IU] via INTRAVENOUS
  Filled 2021-08-20: qty 4500

## 2021-08-20 MED ORDER — CLONAZEPAM 0.5 MG PO TABS
0.5000 mg | ORAL_TABLET | Freq: Two times a day (BID) | ORAL | Status: DC
  Administered 2021-08-20 – 2021-08-21 (×3): 0.5 mg via ORAL
  Filled 2021-08-20 (×3): qty 1

## 2021-08-20 MED ORDER — FENTANYL CITRATE (PF) 100 MCG/2ML IJ SOLN
INTRAMUSCULAR | Status: DC | PRN
  Administered 2021-08-20 (×2): 25 ug via INTRAVENOUS

## 2021-08-20 MED ORDER — TIROFIBAN (AGGRASTAT) BOLUS VIA INFUSION
25.0000 ug/kg | Freq: Once | INTRAVENOUS | Status: AC
  Administered 2021-08-20: 1917.5 ug via INTRAVENOUS
  Filled 2021-08-20: qty 39

## 2021-08-20 MED ORDER — SEVELAMER CARBONATE 800 MG PO TABS
2400.0000 mg | ORAL_TABLET | Freq: Three times a day (TID) | ORAL | Status: DC
Start: 2021-08-21 — End: 2021-08-21
  Administered 2021-08-21: 2400 mg via ORAL
  Filled 2021-08-20: qty 3

## 2021-08-20 MED ORDER — TIROFIBAN HCL IV 12.5 MG/250 ML
INTRAVENOUS | Status: AC
  Filled 2021-08-20: qty 250

## 2021-08-20 MED ORDER — HEPARIN BOLUS VIA INFUSION
2300.0000 [IU] | Freq: Once | INTRAVENOUS | Status: DC
  Filled 2021-08-20: qty 2300

## 2021-08-20 MED ORDER — OXYCODONE HCL 5 MG PO TABS: 5.0000 mg | ORAL_TABLET | ORAL | Status: DC | PRN

## 2021-08-20 MED ORDER — FAMOTIDINE 20 MG PO TABS: 40.0000 mg | ORAL_TABLET | Freq: Once | ORAL | Status: DC | PRN

## 2021-08-20 SURGICAL SUPPLY — 28 items
BALLN LUTONIX 018 5X300X130 (BALLOONS) ×2
BALLN LUTONIX 018 5X60X130 (BALLOONS) ×2
BALLN LUTONIX 018 5X80X130 (BALLOONS) ×2
BALLN ULTRVRSE 2.5X300X150 (BALLOONS) ×2
BALLN ULTRVRSE 3X100X150 (BALLOONS) ×2
BALLOON LUTONIX 018 5X300X130 (BALLOONS) IMPLANT
BALLOON LUTONIX 018 5X60X130 (BALLOONS) IMPLANT
BALLOON LUTONIX 018 5X80X130 (BALLOONS) IMPLANT
BALLOON ULTRVRSE 2.5X300X150 (BALLOONS) IMPLANT
BALLOON ULTRVRSE 3X100X150 (BALLOONS) IMPLANT
CATH ANGIO 5F PIGTAIL 65CM (CATHETERS) ×1 IMPLANT
CATH BEACON 5 .038 100 VERT TP (CATHETERS) ×1 IMPLANT
CATH ROTAREX 135 6FR (CATHETERS) ×1 IMPLANT
COVER PROBE U/S 5X48 (MISCELLANEOUS) ×1 IMPLANT
DEVICE SAFEGUARD 24CM (GAUZE/BANDAGES/DRESSINGS) ×1 IMPLANT
DEVICE STARCLOSE SE CLOSURE (Vascular Products) ×1 IMPLANT
GLIDEWIRE ADV .035X260CM (WIRE) ×1 IMPLANT
KIT ENCORE 26 ADVANTAGE (KITS) ×1 IMPLANT
PACK ANGIOGRAPHY (CUSTOM PROCEDURE TRAY) ×2 IMPLANT
SHEATH ANL2 6FRX45 HC (SHEATH) ×1 IMPLANT
SHEATH BRITE TIP 5FRX11 (SHEATH) ×1 IMPLANT
STENT VIABAHN 6X250X120 (Permanent Stent) ×1 IMPLANT
STENT VIABAHN 6X50X120 (Permanent Stent) ×1 IMPLANT
STENT VIABAHN 6X7.5X120 (Permanent Stent) ×1 IMPLANT
SYR MEDRAD MARK 7 150ML (SYRINGE) ×1 IMPLANT
TUBING CONTRAST HIGH PRESS 72 (TUBING) ×1 IMPLANT
WIRE G V18X300CM (WIRE) ×1 IMPLANT
WIRE GUIDERIGHT .035X150 (WIRE) ×1 IMPLANT

## 2021-08-20 NOTE — Assessment & Plan Note (Addendum)
-   Occlusion of multiple vessels on left lower extremity noted on CT angio on admission ?- Started on heparin drip on admission ?- Taken for angiography by vascular surgery on 08/20/2021.  3 stents placed with angioplasty performed as well.   ?-Continue aspirin and Eliquis at discharge.  Plavix discontinued per vascular recommendations ?

## 2021-08-20 NOTE — Progress Notes (Signed)
?[facility]  ?ROUNDING NOTE  ? ?Subjective:  ?Patient presented with ischemic left lower extremity. ?He underwent vascular surgery intervention today. ?Appreciate vascular surgery intervention. ?He had stent placement in the left SFA and popliteal arteries. ?Patient reports left lower extremity feeling much better now. ? ?Objective:  ?Vital signs in last 24 hours:  ?Temp:  [97.8 ?F (36.6 ?C)-99.4 ?F (37.4 ?C)] 98.3 ?F (36.8 ?C) (04/14 1545) ?Pulse Rate:  [0-102] 102 (04/14 1830) ?Resp:  [11-24] 17 (04/14 1830) ?BP: (101-193)/(62-109) 101/63 (04/14 1830) ?SpO2:  [92 %-100 %] 100 % (04/14 1830) ?Weight:  [76.7 kg-85.6 kg] 85.6 kg (04/14 1545) ? ?Weight change:  ?Filed Weights  ? 08/19/21 2221 08/20/21 1545  ?Weight: 76.7 kg 85.6 kg  ? ? ?Intake/Output: ?I/O last 3 completed shifts: ?In: 100.9 [I.V.:100.9] ?Out: -  ?  ?Intake/Output this shift: ? No intake/output data recorded. ? ?Physical Exam: ?General: No acute distress  ?Head: Normocephalic, atraumatic. Moist oral mucosal membranes  ?Eyes: Anicteric  ?Neck: Supple  ?Lungs:  Clear to auscultation, normal effort  ?Heart: S1S2 no rubs  ?Abdomen:  Soft, nontender, bowel sounds present  ?Extremities: Right lower extremity amputation, warm feeling left lower extremity  ?Neurologic: Awake, alert, following commands  ?Skin: No acute rash  ?Access: Right IJ PermCath  ? ? ?Basic Metabolic Panel: ?Recent Labs  ?Lab 08/19/21 ?2212 08/20/21 ?1514  ?NA 139 136  ?K 4.6 6.2*  ?CL 102 101  ?CO2 24 22  ?GLUCOSE 98 94  ?BUN 38* 44*  ?CREATININE 8.51* 9.64*  ?CALCIUM 8.4* 8.2*  ?PHOS  --  6.8*  ? ? ?Liver Function Tests: ?Recent Labs  ?Lab 08/19/21 ?2212 08/20/21 ?1514  ?AST 14*  --   ?ALT 7  --   ?ALKPHOS 67  --   ?BILITOT 0.6  --   ?PROT 7.1  --   ?ALBUMIN 3.5 3.7  ? ?No results for input(s): LIPASE, AMYLASE in the last 168 hours. ?No results for input(s): AMMONIA in the last 168 hours. ? ?CBC: ?Recent Labs  ?Lab 08/19/21 ?2212 08/20/21 ?1514  ?WBC 8.1 10.0  ?NEUTROABS  5.5  --   ?HGB 10.6* 11.3*  ?HCT 35.6* 37.3*  ?MCV 98.9 98.4  ?PLT 155 186  ? ? ?Cardiac Enzymes: ?No results for input(s): CKTOTAL, CKMB, CKMBINDEX, TROPONINI in the last 168 hours. ? ?BNP: ?Invalid input(s): POCBNP ? ?CBG: ?No results for input(s): GLUCAP in the last 168 hours. ? ?Microbiology: ?Results for orders placed or performed during the hospital encounter of 06/24/20  ?Resp Panel by RT-PCR (Flu A&B, Covid) Nasopharyngeal Swab     Status: Abnormal  ? Collection Time: 06/24/20 10:28 PM  ? Specimen: Nasopharyngeal Swab; Nasopharyngeal(NP) swabs in vial transport medium  ?Result Value Ref Range Status  ? SARS Coronavirus 2 by RT PCR POSITIVE (A) NEGATIVE Final  ?  Comment: RESULT CALLED TO, READ BACK BY AND VERIFIED WITH: ?KANDELL SHEETS @0001  ON 06/25/20 SKL ?(NOTE) ?SARS-CoV-2 target nucleic acids are DETECTED. ? ?The SARS-CoV-2 RNA is generally detectable in upper respiratory ?specimens during the acute phase of infection. Positive results are ?indicative of the presence of the identified virus, but do not rule ?out bacterial infection or co-infection with other pathogens not ?detected by the test. Clinical correlation with patient history and ?other diagnostic information is necessary to determine patient ?infection status. The expected result is Negative. ? ?Fact Sheet for Patients: ?EntrepreneurPulse.com.au ? ?Fact Sheet for Healthcare Providers: ?IncredibleEmployment.be ? ?This test is not yet approved or cleared by the [place]  and  ?has been authorized for detection and/or diagnosis of SARS-CoV-2 by ?FDA under an Emergency Use Authorization (EUA).  This EUA will ?remain in effect (meaning this test can  be used) for the duration of  ?the COVID-19 declaration under Section 564(b)(1) of the Act, 21 ?U.S.C. section 360bbb-3(b)(1), unless the authorization is ?terminated or revoked sooner. ? ?  ? Influenza A by PCR NEGATIVE NEGATIVE Final  ? Influenza B by PCR  NEGATIVE NEGATIVE Final  ?  Comment: (NOTE) ?The Xpert Xpress SARS-CoV-2/FLU/RSV plus assay is intended as an aid ?in the diagnosis of influenza from Nasopharyngeal swab specimens and ?should not be used as a sole basis for treatment. Nasal washings and ?aspirates are unacceptable for Xpert Xpress SARS-CoV-2/FLU/RSV ?testing. ? ?Fact Sheet for Patients: ?EntrepreneurPulse.com.au ? ?Fact Sheet for Healthcare Providers: ?IncredibleEmployment.be ? ?This test is not yet approved or cleared by the [place] FDA and ?has been authorized for detection and/or diagnosis of SARS-CoV-2 by ?FDA under an Emergency Use Authorization (EUA). This EUA will remain ?in effect (meaning this test can be used) for the duration of the ?COVID-19 declaration under Section 564(b)(1) of the Act, 21 U.S.C. ?section 360bbb-3(b)(1), unless the authorization is terminated or ?revoked. ? ?Performed at [facility], [place], ?[place] [address] ?  ?CULTURE, BLOOD (ROUTINE X 2) w Reflex to ID Panel     Status: None  ? Collection Time: 06/25/20  5:03 AM  ? Specimen: BLOOD  ?Result Value Ref Range Status  ? Specimen Description BLOOD RIGHT FA  Final  ? Special Requests   Final  ?  BOTTLES DRAWN AEROBIC AND ANAEROBIC Blood Culture adequate volume  ? Culture   Final  ?  NO GROWTH 5 DAYS ?Performed at [facility], [address], [place], [place] [address] ?  ? Report Status 06/30/2020 FINAL  Final  ?CULTURE, BLOOD (ROUTINE X 2) w Reflex to ID Panel     Status: None  ? Collection Time: 06/25/20  5:03 AM  ? Specimen: BLOOD  ?Result Value Ref Range Status  ? Specimen Description BLOOD LEFT AC  Final  ? Special Requests   Final  ?  BOTTLES DRAWN AEROBIC AND ANAEROBIC Blood Culture results may not be optimal due to an excessive volume of blood received in culture bottles  ? Culture   Final  ?  NO GROWTH 5 DAYS ?Performed at [facility], [address], [place], [place]  [id number] ?  ? Report Status 06/30/2020 FINAL  Final  ? ? ?Coagulation Studies: ?Recent Labs  ?  08/19/21 ?2244  ?LABPROT 14.3  ?INR 1.1  ? ? ?Urinalysis: ?No results for input(s): COLORURINE, LABSPEC, [place], GLUCOSEU, HGBUR, BILIRUBINUR, KETONESUR, PROTEINUR, UROBILINOGEN, NITRITE, LEUKOCYTESUR in the last 72 hours. ? ?Invalid input(s): APPERANCEUR  ? ? ?Imaging: ?CT Angio Aortobifemoral W and/or Wo Contrast ? ?Result Date: 08/19/2021 ?CLINICAL DATA:  History of prior right-sided amputation with left lower extremity leg pain and numbness, evaluate for EXAM: CT ANGIOGRAPHY OF ABDOMINAL AORTA WITH ILIOFEMORAL RUNOFF TECHNIQUE: Multidetector CT imaging of the abdomen, pelvis and lower extremities was performed using the standard protocol during bolus administration of intravenous contrast. Multiplanar CT image reconstructions and MIPs were obtained to evaluate the vascular anatomy. RADIATION DOSE REDUCTION: This exam was performed according to the departmental dose-optimization program which includes automated exposure control, adjustment of the mA and/or kV according to patient size and/or use of iterative reconstruction technique. CONTRAST:  134mL OMNIPAQUE IOHEXOL 350 MG/ML SOLN COMPARISON:  None.  FINDINGS: VASCULAR Aorta: Atherosclerotic calcifications of the abdominal aorta are noted. No aneurysmal dilatation or dissection is seen. Celiac: Calcific changes are noted at the origin of the celiac axis although no focal hemodynamically significant stenosis is noted. SMA: Atherosclerotic changes are noted at the origin of the superior mesenteric artery although no focal hemodynamically significant stenosis is seen. Renals: Dual renal arteries are noted on the right with single renal artery on the left. Diffuse atherosclerotic calcifications are noted although no focal hemodynamically significant stenosis is seen. Vascular calcifications are noted throughout the renal artery which likely contribute to the patient's  end-stage renal disease. IMA: Patent without evidence of aneurysm, dissection, vasculitis or significant stenosis. RIGHT Lower Extremity Inflow: Atherosclerotic calcifications are noted in the iliac arteries

## 2021-08-20 NOTE — Progress Notes (Addendum)
?Progress Note ? ? ? ?[name]   ?TOI:[id number]  ?DOB: 1950/10/24  ?DOA: 08/19/2021     0 ?PCP: [name], MD ? ?Initial CC: Left leg pain and numbness ? ?Hospital Course: ?Mr. Mavrik is a 71 yo male with PMH ESRD, HTN, HLD, CHF, PVD s/p R AKA who presented with left leg pain and numbness to the foot.  ?He underwent CT angio of the left lower extremity on admission which showed occlusion of the left SFA and occluded stents.  He was evaluated by vascular surgery and taken for angiography after heparin drip was started on admission as well. ?He underwent angioplasty and placement of 3 stents during procedure.  He was transitioned to Aggrastat after procedure. ? ?Interval History:  ?Seen in room after returning from angiography.  He endorsed that his pain and numbness was significantly improved.  Still lethargic from sedation but able to answer questions easily. ?His left leg and foot were warm; did appreciate mildly cool to the touch toes.  ? ?Assessment and Plan: ?* Acute lower limb ischemia, left ?- Occlusion of multiple vessels on left lower extremity noted on CT angio on admission ?- Started on heparin drip on admission ?- Taken for angiography by vascular surgery on 08/20/2021.  3 stents placed with angioplasty performed as well.   ?- continue neurovascular checks as foot perfusion difficult to fully restore/evaluate; continue aggrastat per vascular surgery ?- patient seen after procedure and his leg and foot was warm (cool to touch toes) and his pain and numbness had resolved. ? ?PVD (peripheral vascular disease) ([place]) ?- On aspirin, Plavix, Lipitor at home ?- Currently on Aggrastat ? ?ESRD on hemodialysis ([place]) ?- Nephrology consult for continuation of dialysis ?-Patient going for dialysis on Friday after angiography ? ?Above knee amputation of right lower extremity ([place]) ?Increase nursing assistance for transfers ? ?Chronic diastolic CHF (congestive heart failure) ([place]) ?Not acutely  exacerbated ?Continue carvedilol, losartan ? ?Hypertension ?Continue carvedilol ? ? ?Old records reviewed in assessment of this patient ? ?Antimicrobials: ? ? ?DVT prophylaxis:  ?  Aggrastat ? ? ?Code Status:   Code Status: Full Code ? ?Disposition Plan: Home in 1 to 2 days ?Status is: Inpatient ? ?Objective: ?Blood pressure 115/65, pulse 84, temperature 97.8 ?F (36.6 ?C), resp. rate 14, height 5\' 8"  (1.727 m), weight 76.7 kg, SpO2 100 %.  ?Examination:  ?Physical Exam ?Constitutional:   ?   Appearance: Normal appearance.  ?HENT:  ?   Head: Normocephalic and atraumatic.  ?   Mouth/Throat:  ?   Mouth: Mucous membranes are moist.  ?Eyes:  ?   Extraocular Movements: Extraocular movements intact.  ?Cardiovascular:  ?   Rate and Rhythm: Normal rate and regular rhythm.  ?Pulmonary:  ?   Effort: Pulmonary effort is normal.  ?   Breath sounds: Normal breath sounds.  ?Abdominal:  ?   General: Bowel sounds are normal. There is no distension.  ?   Palpations: Abdomen is soft.  ?   Tenderness: There is no abdominal tenderness.  ?Musculoskeletal:     ?   General: Normal range of motion.  ?   Cervical back: Normal range of motion and neck supple.  ?   Comments: R AKA noted; LLE no TTP; unable to palpate DP  ?Skin: ?   General: Skin is warm and dry.  ?Neurological:  ?   Mental Status: He is alert and oriented to person, place, and time.  ?Psychiatric:     ?  Mood and Affect: Mood normal.     ?   Behavior: Behavior normal.  ?  ? ?Consultants:  ?Vascular surgery ?Nephrology ? ?Procedures:  ?Left lower extremity angiography, angioplasty, stent placement x 3, 08/20/2021 ? ?Data Reviewed: ?Results for orders placed or performed during the hospital encounter of 08/19/21 (from the past 24 hour(s))  ?Comprehensive metabolic panel     Status: Abnormal  ? Collection Time: 08/19/21 10:12 PM  ?Result Value Ref Range  ? Sodium 139 135 - 145 mmol/L  ? Potassium 4.6 3.5 - 5.1 mmol/L  ? Chloride 102 98 - 111 mmol/L  ? CO2 24 22 - 32 mmol/L  ?  Glucose, Bld 98 70 - 99 mg/dL  ? BUN 38 (H) 8 - 23 mg/dL  ? Creatinine, Ser 8.51 (H) 0.61 - 1.24 mg/dL  ? Calcium 8.4 (L) 8.9 - 10.3 mg/dL  ? Total Protein 7.1 6.5 - 8.1 g/dL  ? Albumin 3.5 3.5 - 5.0 g/dL  ? AST 14 (L) 15 - 41 U/L  ? ALT 7 0 - 44 U/L  ? Alkaline Phosphatase 67 38 - 126 U/L  ? Total Bilirubin 0.6 0.3 - 1.2 mg/dL  ? GFR, Estimated 6 (L) >60 mL/min  ? Anion gap 13 5 - 15  ?CBC with Differential     Status: Abnormal  ? Collection Time: 08/19/21 10:12 PM  ?Result Value Ref Range  ? WBC 8.1 4.0 - 10.5 K/uL  ? RBC 3.60 (L) 4.22 - 5.81 MIL/uL  ? Hemoglobin 10.6 (L) 13.0 - 17.0 g/dL  ? HCT 35.6 (L) 39.0 - 52.0 %  ? MCV 98.9 80.0 - 100.0 fL  ? MCH 29.4 26.0 - 34.0 pg  ? MCHC 29.8 (L) 30.0 - 36.0 g/dL  ? RDW 15.9 (H) 11.5 - 15.5 %  ? Platelets 155 150 - 400 K/uL  ? nRBC 0.0 0.0 - 0.2 %  ? Neutrophils Relative % 67 %  ? Neutro Abs 5.5 1.7 - 7.7 K/uL  ? Lymphocytes Relative 20 %  ? Lymphs Abs 1.6 0.7 - 4.0 K/uL  ? Monocytes Relative 9 %  ? Monocytes Absolute 0.7 0.1 - 1.0 K/uL  ? Eosinophils Relative 3 %  ? Eosinophils Absolute 0.2 0.0 - 0.5 K/uL  ? Basophils Relative 1 %  ? Basophils Absolute 0.1 0.0 - 0.1 K/uL  ? Immature Granulocytes 0 %  ? Abs Immature Granulocytes 0.01 0.00 - 0.07 K/uL  ?Protime-INR     Status: None  ? Collection Time: 08/19/21 10:44 PM  ?Result Value Ref Range  ? Prothrombin Time 14.3 11.4 - 15.2 seconds  ? INR 1.1 0.8 - 1.2  ?APTT     Status: None  ? Collection Time: 08/19/21 10:44 PM  ?Result Value Ref Range  ? aPTT 33 24 - 36 seconds  ?Heparin level (unfractionated)     Status: Abnormal  ? Collection Time: 08/20/21  8:27 AM  ?Result Value Ref Range  ? Heparin Unfractionated <0.10 (L) 0.30 - 0.70 IU/mL  ?Renal function panel     Status: Abnormal  ? Collection Time: 08/20/21  3:14 PM  ?Result Value Ref Range  ? Sodium 136 135 - 145 mmol/L  ? Potassium 6.2 (H) 3.5 - 5.1 mmol/L  ? Chloride 101 98 - 111 mmol/L  ? CO2 22 22 - 32 mmol/L  ? Glucose, Bld 94 70 - 99 mg/dL  ? BUN 44 (H) 8 - 23  mg/dL  ? Creatinine, Ser 9.64 (H) 0.61 - 1.24 mg/dL  ?  Calcium 8.2 (L) 8.9 - 10.3 mg/dL  ? Phosphorus 6.8 (H) 2.5 - 4.6 mg/dL  ? Albumin 3.7 3.5 - 5.0 g/dL  ? GFR, Estimated 5 (L) >60 mL/min  ? Anion gap 13 5 - 15  ?CBC     Status: Abnormal  ? Collection Time: 08/20/21  3:14 PM  ?Result Value Ref Range  ? WBC 10.0 4.0 - 10.5 K/uL  ? RBC 3.79 (L) 4.22 - 5.81 MIL/uL  ? Hemoglobin 11.3 (L) 13.0 - 17.0 g/dL  ? HCT 37.3 (L) 39.0 - 52.0 %  ? MCV 98.4 80.0 - 100.0 fL  ? MCH 29.8 26.0 - 34.0 pg  ? MCHC 30.3 30.0 - 36.0 g/dL  ? RDW 15.9 (H) 11.5 - 15.5 %  ? Platelets 186 150 - 400 K/uL  ? nRBC 0.0 0.0 - 0.2 %  ?  ?I have Reviewed nursing notes, Vitals, and Lab results since pt's last encounter. Pertinent lab results : see above ?I have ordered test including BMP, CBC, Mg ?I have reviewed the last note from staff over past 24 hours ?I have discussed pt's care plan and test results with nursing staff, case manager ? ? LOS: 0 days  ? ?Dwyane Dee, MD ?[facility] ?08/20/2021, 4:05 PM ? ?

## 2021-08-20 NOTE — Assessment & Plan Note (Signed)
Not acutely exacerbated ?Continue carvedilol, losartan ?

## 2021-08-20 NOTE — Progress Notes (Signed)
ANTICOAGULATION CONSULT NOTE - Initial Consult ? ?Pharmacy Consult for Heparin  ?Indication:  limb ischemia ? ?Allergies  ?Allergen Reactions  ? Shellfish Allergy Anaphylaxis  ? Other   ? ? ?Patient Measurements: ?Height: 5\' 8"  (172.7 cm) ?Weight: 76.7 kg (169 lb) ?IBW/kg (Calculated) : 68.4 ?Heparin Dosing Weight: 76.7 kg  ? ?Vital Signs: ?Temp: 99.4 ?F (37.4 ?C) (04/13 2156) ?Temp Source: Oral (04/13 2156) ?BP: 156/79 (04/13 2330) ?Pulse Rate: 96 (04/13 2330) ? ?Labs: ?Recent Labs  ?  08/19/21 ?2212 08/19/21 ?2244  ?HGB 10.6*  --   ?HCT 35.6*  --   ?PLT 155  --   ?LABPROT  --  14.3  ?INR  --  1.1  ?CREATININE 8.51*  --   ? ? ?Estimated Creatinine Clearance: 7.6 mL/min (A) (by C-G formula based on SCr of 8.51 mg/dL (H)). ? ? ?Medical History: ?Past Medical History:  ?Diagnosis Date  ? Anemia   ? Anxiety   ? CHF (congestive heart failure) ([place])   ? Chronic kidney disease   ? esrd dialysis m/w/f  ? Dialysis patient [facility])   ? M, W, F  ? Gout   ? Hyperlipidemia   ? Hypertension   ? Myocardial infarction [facility]) 2010  ? 10 years ago  ? Neuromuscular disorder ([place]) 2020  ? 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  ? Peripheral vascular disease ([place])   ? S/P BKA (below knee amputation), right ([place])   ? Wears dentures   ? full upper and lower  ? ? ?Medications:  ?(Not in a hospital admission)  ? ?Assessment: ?Pharmacy consulted to dose heparin in this 71 year old male admitted with limb ischemia.  No prior anticoag noted. ?CrCl = 7.6 ml/min  ? ?Goal of Therapy:  ?Heparin level 0.3-0.7 units/ml ?Monitor platelets by anticoagulation protocol: Yes ?  ?Plan:  ?Give 4500 units bolus x 1 ?Start heparin infusion at 1300 units/hr ?Check anti-Xa level in 8 hours and daily while on heparin ?Continue to monitor H&H and platelets ? ?Wendie Diskin D ?08/20/2021,12:20 AM ? ? ?

## 2021-08-20 NOTE — Progress Notes (Signed)
Hemodialysis Post Treatment Note: ? ?Tx date: 08/20/2021 ?Tx time: 3 hours and 30 minutes ?Access:right cvc ?UF Removed:1L ? ?Note: ?Hd completed, tolerated well, no complications, patient asymptomatic. ? ? ? ? ? ? ? ?  ?

## 2021-08-20 NOTE — Assessment & Plan Note (Addendum)
-   On aspirin, Plavix, Lipitor at home ?- Currently on Aggrastat ?

## 2021-08-20 NOTE — Hospital Course (Addendum)
Johnny [name] is a 71 yo male with PMH ESRD, HTN, HLD, CHF, PVD s/p R AKA who presented with left leg pain and numbness to the foot.  ?He underwent CT angio of the left lower extremity on admission which showed occlusion of the left SFA and occluded stents.  He was evaluated by vascular surgery and taken for angiography after heparin drip was started on admission as well. ?He underwent angioplasty and placement of 3 stents during procedure.  He was transitioned to Aggrastat after procedure.  On discharge he was started on Eliquis and Plavix was discontinued. ?

## 2021-08-20 NOTE — Assessment & Plan Note (Signed)
Continue carvedilol 

## 2021-08-20 NOTE — Plan of Care (Signed)

## 2021-08-20 NOTE — Op Note (Signed)
[facility] ? Percutaneous Study/Intervention Procedural Note ? ? ?Date of Surgery: 08/20/2021 ? ?Surgeon(s):Lailee Hoelzel   ? ?Assistants:none ? ?Pre-operative Diagnosis: PAD with rest pain left lower extremity, acute on chronic disease ? ?Post-operative diagnosis:  Same ? ?Procedure(s) Performed: ?            1.  Ultrasound guidance for vascular access right femoral artery ?            2.  Catheter placement into left common femoral artery from right femoral approach ?            3.  Aortogram and selective left lower extremity angiogram ?            4.  Percutaneous transluminal angioplasty of left posterior tibial artery with 2.5 mm diameter by 30 cm length angioplasty balloon and of the proximal posterior tibial artery and tibioperoneal trunk with 3 mm diameter by 10 cm length angioplasty balloon ?            5.  Mechanical thrombectomy to the left SFA, popliteal artery, and tibioperoneal trunk with the [place] Rex device ? 6.  Percutaneous transluminal angioplasty of the left popliteal artery with 5 mm diameter by 6 cm length Lutonix drug-coated angioplasty balloon ? 7.  Viabahn stent placement x3 to the left SFA and popliteal arteries with a 6 mm diameter by 5 cm length, 6 mm diameter by 7.5 cm length, and a 6 mm diameter by 25 cm length Viabahn stent for residual stenosis and thrombus after above procedures ?            8.  StarClose closure device right femoral artery ? ?EBL: 100 cc ? ?Contrast: 80 cc ? ?Fluoro Time: 11.8 minutes ? ?Moderate Conscious Sedation Time: approximately 77 minutes using 1 mg of Versed and 50 mcg of Fentanyl ?             ?Indications:  Patient is a 71 y.o.male with relatively acute on chronic rest pain of the left leg with a known history of severe peripheral arterial disease. The patient is brought in for angiography for further evaluation and potential treatment.  Due to the limb threatening nature of the situation, angiogram was performed for attempted limb  salvage. The patient is aware that if the procedure fails, amputation would be expected.  The patient also understands that even with successful revascularization, amputation may still be required due to the severity of the situation.  Risks and benefits are discussed and informed consent is obtained.  ? ?Procedure:  The patient was identified and appropriate procedural time out was performed.  The patient was then placed supine on the table and prepped and draped in the usual sterile fashion. Moderate conscious sedation was administered during a face to face encounter with the patient throughout the procedure with my supervision of the RN administering medicines and monitoring the patient's vital signs, pulse oximetry, telemetry and mental status throughout from the start of the procedure until the patient was taken to the recovery room. Ultrasound was used to evaluate the right common femoral artery.  It was patent .  A digital ultrasound image was acquired.  A Seldinger needle was used to access the right common femoral artery under direct ultrasound guidance and a permanent image was performed.  A 0.035 J wire was advanced without resistance and a 5Fr sheath was placed.  Pigtail catheter was placed into the aorta and an AP aortogram was performed. This demonstrated that the renal  artery flow was somewhat sluggish but no obvious focal stenosis.  The aorta and iliac arteries were heavily calcified but not stenotic. I then crossed the aortic bifurcation and advanced to the left femoral head. Selective left lower extremity angiogram was then performed. This demonstrated no stenosis in the left common femoral artery with a somewhat small but not focally stenotic profunda femoris artery.  The SFA occluded a few centimeters above the previously placed stents which were occluded down through the popliteal artery.  There was occlusion of the tibioperoneal trunk and the origins of all 3 tibial vessels with what appeared  to be the posterior tibial artery is the best runoff distally although flow is sluggish.  This was also heavily diseased.  The anterior tibial artery is diseased over a long segment with occlusion with some reconstitution in the foot.  The peroneal artery was small and occluded proximally but did provide additional flow distally. It was felt that it was in the patient's best interest to proceed with intervention after these images to avoid a second procedure and a larger amount of contrast and fluoroscopy based off of the findings from the initial angiogram. The patient was systemically heparinized and a 6 [place] Ansell sheath was then placed over the [employer] wire. I then used a Kumpe catheter and the advantage wire to navigate through the occluded SFA, popliteal, and tibioperoneal trunk without difficulty and get down into the posterior tibial artery confirming intraluminal flow.  I then exchanged for a V18 wire.  The posterior tibial artery was treated with a long 2.5 mm diameter by 30 cm length angioplasty balloon to treat the diffusely diseased vessel all the way down to the foot and ankle.  A 3 mm diameter by 10 cm length angioplasty balloon was inflated in the tibioperoneal trunk and proximal posterior tibial artery.  To debulk the chronic thrombus in the left SFA and popliteal arteries, mechanical thrombectomy was performed with a total of 3 passes with the [place] Rex device in the left SFA, popliteal artery, and going down into the tibioperoneal trunk.  This resulted in a channel of flow, but multiple areas of greater than 50% residual stenosis and thrombus.  The tightest area was a hyperplastic stenosis at the distal edge of the previously placed stent in the popliteal artery and I treated this with a 5 mm diameter by 6 cm length Lutonix drug-coated angioplasty balloon inflated to 10 atm for 1 minute.  Minimal improvement was seen and so I placed a 6 mm diameter by 5 cm length Viabahn stent at the  distal endpoint in the popliteal artery.  There was thrombus at the proximal edge of the previously placed stent and I elected to primarily stent this with a 6 mm diameter by 7.5 cm length Viabahn stent and we postdilated this with a 5 mm balloon.  Following this, there was now more thrombus within the body of the old stent that had come out from the placement of the 2 initial stents and I elected to bridge these 2 stents with a third Viabahn stent which was a 6 mm diameter by 25 cm length Viabahn stent postdilated with a 5 mm balloon.  Completion imaging following this showed less than 10% residual stenosis in the SFA, popliteal artery, tibioperoneal trunk and the proximal portion of the posterior tibial artery.  The distal posterior tibial artery appeared to be in significant spasm even after intra-arterial nitroglycerin and getting flow into the foot was difficult to opacify and  there may also be some distal thrombus and we will treat this with Aggrastat.  I did not feel there was much further we can do from an endovascular standpoint today. I elected to terminate the procedure. The sheath was removed and StarClose closure device was deployed in the right femoral artery with excellent hemostatic result. The patient was taken to the recovery room in stable condition having tolerated the procedure well. ? ?Findings:   ?            Aortogram:  This demonstrated that the renal artery flow was somewhat sluggish but no obvious focal stenosis.  The aorta and iliac arteries were heavily calcified but not stenotic. ?            Left lower Extremity: This demonstrated no stenosis in the left common femoral artery with a somewhat small but not focally stenotic profunda femoris artery.  The SFA occluded a few centimeters above the previously placed stents which were occluded down through the popliteal artery.  There was occlusion of the tibioperoneal trunk and the origins of all 3 tibial vessels with what appeared to be the  posterior tibial artery is the best runoff distally although flow is sluggish.  This was also heavily diseased.  The anterior tibial artery is diseased over a long segment with occlusion with some reconstit

## 2021-08-20 NOTE — Assessment & Plan Note (Signed)
Increase nursing assistance for transfers ?

## 2021-08-20 NOTE — Assessment & Plan Note (Addendum)
-   Nephrology consult for continuation of dialysis ?-Patient going for dialysis on Friday after angiography ?

## 2021-08-20 NOTE — Progress Notes (Signed)
ANTICOAGULATION CONSULT NOTE - Initial Consult ? ?Pharmacy Consult for Heparin  ?Indication:  limb ischemia ? ?Allergies  ?Allergen Reactions  ? Shellfish Allergy Anaphylaxis  ? Other   ? ? ?Patient Measurements: ?Height: 5\' 8"  (172.7 cm) ?Weight: 76.7 kg (169 lb) ?IBW/kg (Calculated) : 68.4 ?Heparin Dosing Weight: 76.7 kg  ? ?Vital Signs: ?Temp: 97.8 ?F (36.6 ?C) (04/14 0626) ?Temp Source: Oral (04/14 0144) ?BP: 161/66 (04/14 0758) ?Pulse Rate: 95 (04/14 0758) ? ?Labs: ?Recent Labs  ?  08/19/21 ?2212 08/19/21 ?2244 08/20/21 ?9485  ?HGB 10.6*  --   --   ?HCT 35.6*  --   --   ?PLT 155  --   --   ?APTT  --  33  --   ?LABPROT  --  14.3  --   ?INR  --  1.1  --   ?HEPARINUNFRC  --   --  <0.10*  ?CREATININE 8.51*  --   --   ? ? ? ?Estimated Creatinine Clearance: 7.6 mL/min (A) (by C-G formula based on SCr of 8.51 mg/dL (H)). ? ? ?Medical History: ?Past Medical History:  ?Diagnosis Date  ? Anemia   ? Anxiety   ? CHF (congestive heart failure) ([place])   ? Chronic kidney disease   ? esrd dialysis m/w/f  ? Dialysis patient [facility])   ? M, W, F  ? Gout   ? Hyperlipidemia   ? Hypertension   ? Myocardial infarction [facility]) 2010  ? 10 years ago  ? Neuromuscular disorder ([place]) 2020  ? 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  ? Peripheral vascular disease ([place])   ? S/P BKA (below knee amputation), right ([place])   ? Wears dentures   ? full upper and lower  ? ? ?Medications:  ?Medications Prior to Admission  ?Medication Sig Dispense Refill Last Dose  ? allopurinol (ZYLOPRIM) 100 MG tablet Take 100 mg by mouth every evening.     ? aspirin EC 81 MG tablet Take 1 tablet (81 mg total) by mouth daily. 150 tablet 2   ? atorvastatin (LIPITOR) 10 MG tablet Take 10 mg by mouth at bedtime.     ? carvedilol (COREG) 3.125 MG tablet Take 3.125 mg by mouth 2 (two) times daily.     ? clonazePAM (KLONOPIN) 0.5 MG tablet Take 1 tablet (0.5 mg total) by mouth as needed. (Patient taking differently: Take 0.5 mg by mouth 2 (two) times daily.) 10 tablet 0   ?  clopidogrel (PLAVIX) 75 MG tablet Take 1 tablet (75 mg total) by mouth daily. 30 tablet 11   ? folic acid-vitamin b complex-vitamin c-selenium-zinc (DIALYVITE) 3 MG TABS tablet Take 1 tablet by mouth daily.      ? gabapentin (NEURONTIN) 100 MG capsule Take 2 capsules (200 mg total) by mouth 2 (two) times daily. After dialysis session (Patient taking differently: Take 100 mg by mouth See admin instructions. Take 100 mg twice a day and on MON. Wed and Friday take and addition 100 mg in the evening after dialysis)     ? hydrALAZINE (APRESOLINE) 25 MG tablet Hold until followup with outpatient doctor due to intermittently low blood pressure. (Patient not taking: Reported on 06/29/2021)     ? losartan (COZAAR) 100 MG tablet Hold until followup with outpatient doctor due to intermittently low blood pressure. (Patient taking differently: Take 100 mg by mouth every evening.)     ? sevelamer carbonate (RENVELA) 800 MG tablet Take 2,400 mg by mouth 2 (two) times daily.     ?  vitamin C (ASCORBIC ACID) 500 MG tablet Take 500 mg by mouth daily.     ? ? ?Assessment: ?Pharmacy consulted to dose heparin in this 71 year old male admitted with limb ischemia.  No prior anticoag noted. ?CrCl = 7.6 ml/min  ? ?Goal of Therapy:  ?Heparin level 0.3-0.7 units/ml ?Monitor platelets by anticoagulation protocol: Yes ?  ?Plan:  ?4/14@0827 : HL<0.10, subtherapeutic ?Give 2300 units bolus x 1 ?Increase heparin infusion to 1550 units/hr ?Check anti-Xa level in 8 hours and daily while on heparin ?Continue to monitor H&H and platelets ? ?Owynn Mosqueda A Viktorya Arguijo ?08/20/2021,9:28 AM ? ? ?

## 2021-08-20 NOTE — ED Provider Notes (Signed)
Patient signed out to me at 32 PM.  71 year old male with history of significant peripheral artery disease status post right AKA and end-stage renal disease who presents with left leg pain for 2 days.  He has no palpable or dopplerable pulses.  CT angio with runoff ordered pending follow-up. ? ?They shows an occluded left SFA which is above the left sided stent there is some collateral runoff.  Discussed the findings with Dr. Lucky Cowboy recommends heparin keeping the patient n.p.o. we will go for angio in the morning.  I did evaluate the patient appears comfortable at this time.  The leg is cold.  Will discuss with hospitalist for admission. ?  ?Rada Hay, MD ?08/20/21 0005 ? ?

## 2021-08-20 NOTE — Assessment & Plan Note (Signed)
Sliding scale insulin coverage 

## 2021-08-20 NOTE — H&P (Addendum)
?History and Physical  ? ? ?Patient: Johnny [name]. YWV:[id number] DOB: 06-04-1950 ?DOA: 08/19/2021 ?DOS: the patient was seen and examined on 08/20/2021 ?PCP: [name], MD  ?Patient coming from: Home ? ?Chief Complaint:  ?Chief Complaint  ?Patient presents with  ? foot numbness  ? Foot Pain  ? ? ?HPI: Johnny [name]. is a 71 y.o. male with medical history significant for ESRD on HD, PVD s/p right AKA with history of left SFA stent, HTN, G 1 diastolic CHF 8546, who presents to the ED with a 2-day history of left leg pain associated with numbness of the left foot radiating up the left calf.  He denies any injuries. ?ED course and data reviewed: On arrival, temperature 99.4 with otherwise normal vitals.  Labs as expected for dialysis status.  Hemoglobin 10.6 which is his baseline.  Pulses were nonpalpable and not dopplerable in the ED.  CTA abdominal aorta with iliofemoral runoff shows the following findings: ?IMPRESSION: ?VASCULAR ?  ?Changes consistent with the known right above knee amputation with ?occlusion of the midportion of the right superficial femoral artery ?just above a previously placed vascular stent. ?  ?Occlusion of the left superficial femoral artery in the mid thigh ?just above a long segment of arterial stenting. The stents are ?occluded and there is reconstitution of the popliteal artery of via ?muscular collaterals just below the distal aspect of the arterial ?stents. ?  ?Dominant runoff to the left ankle is via the peroneal artery with ?distal collateral flow to the anterior and posterior tibial ?arteries. Multifocal areas of narrowing in the anterior and ?posterior tibial artery are noted in the calf. ?  ?NON-VASCULAR ?  ?Bibasilar atelectasis. ?  ?Changes consistent with end-stage renal disease. ?  ?Bilateral inguinal and umbilical hernias containing fat. ? ?The ED provider spoke with vascular surgeon, Dr. Lucky Cowboy who recommended heparin infusion and will perform angiography in the  a.m.  Patient was given oxycodone for pain and hospitalist consulted.  ? ?Review of Systems: As mentioned in the history of present illness. All other systems reviewed and are negative. ?Past Medical History:  ?Diagnosis Date  ? Anemia   ? Anxiety   ? CHF (congestive heart failure) ([place])   ? Chronic kidney disease   ? esrd dialysis m/w/f  ? Dialysis patient [facility])   ? M, W, F  ? Gout   ? Hyperlipidemia   ? Hypertension   ? Myocardial infarction [facility]) 2010  ? 10 years ago  ? Neuromuscular disorder ([place]) 2020  ? 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  ? Peripheral vascular disease ([place])   ? S/P BKA (below knee amputation), right ([place])   ? Wears dentures   ? full upper and lower  ? ?Past Surgical History:  ?Procedure Laterality Date  ? A/V FISTULAGRAM Right 09/06/2018  ? Procedure: A/V FISTULAGRAM;  Surgeon: Algernon Huxley, MD;  Location: [place] [facility];  Service: Cardiovascular;  Laterality: Right;  ? A/V SHUNTOGRAM Left 06/21/2017  ? Procedure: A/V SHUNTOGRAM;  Surgeon: Katha Cabal, MD;  Location: [place] [facility];  Service: Cardiovascular;  Laterality: Left;  ? A/V SHUNTOGRAM N/A 10/24/2018  ? Procedure: A/V SHUNTOGRAM;  Surgeon: Algernon Huxley, MD;  Location: [place] [facility];  Service: Cardiovascular;  Laterality: N/A;  ? ABOVE KNEE LEG AMPUTATION Right 2020  ? AMPUTATION Right 10/25/2018  ? Procedure: AMPUTATION ABOVE KNEE;  Surgeon: Algernon Huxley, MD;  Location: [facility];  Service: General;  Laterality: Right;  ?  APPLICATION OF WOUND VAC Right 04/11/2018  ? Procedure: APPLICATION OF WOUND VAC;  Surgeon: Algernon Huxley, MD;  Location: [facility];  Service: Vascular;  Laterality: Right;  ? AV FISTULA PLACEMENT Left 09/18/2015  ? Procedure: INSERTION OF ARTERIOVENOUS (AV) GORE-TEX GRAFT ARM ( BRACH/AXILLARY GRAFT W/ INSTANT STICK GRAFT );  Surgeon: Katha Cabal, MD;  Location: [facility];  Service: Vascular;  Laterality: Left;  ? AV FISTULA PLACEMENT Right 07/19/2018  ? Procedure: INSERTION OF ARTERIOVENOUS  (AV) GORE-TEX GRAFT ARM ( BRACHIAL AXILLARY);  Surgeon: Algernon Huxley, MD;  Location: [facility];  Service: Vascular;  Laterality: Right;  ? CATARACT EXTRACTION W/PHACO Left 07/01/2021  ? Procedure: CATARACT EXTRACTION PHACO AND INTRAOCULAR LENS PLACEMENT (IOC) LEFT 7.92 00:47.3;  Surgeon: Eulogio Bear, MD;  Location: [facility];  Service: Ophthalmology;  Laterality: Left;  ? DIALYSIS FISTULA CREATION Right 10/2017  ? right chest perm cath  ? DIALYSIS/PERMA CATHETER INSERTION N/A 06/09/2020  ? Procedure: DIALYSIS/PERMA CATHETER INSERTION;  Surgeon: Algernon Huxley, MD;  Location: [place] [facility];  Service: Cardiovascular;  Laterality: N/A;  ? DIALYSIS/PERMA CATHETER INSERTION N/A 07/30/2020  ? Procedure: DIALYSIS/PERMA CATHETER EXCHANGE;  Surgeon: Algernon Huxley, MD;  Location: [place] [facility];  Service: Cardiovascular;  Laterality: N/A;  ? DIALYSIS/PERMA CATHETER REMOVAL N/A 09/13/2018  ? Procedure: DIALYSIS/PERMA CATHETER REMOVAL;  Surgeon: Algernon Huxley, MD;  Location: [place] [facility];  Service: Cardiovascular;  Laterality: N/A;  ? ESOPHAGOGASTRODUODENOSCOPY N/A 12/19/2017  ? Procedure: ESOPHAGOGASTRODUODENOSCOPY (EGD);  Surgeon: Lin Landsman, MD;  Location[facility];  Service: Gastroenterology;  Laterality: N/A;  ? LOWER EXTREMITY ANGIOGRAPHY Left 11/16/2017  ? Procedure: LOWER EXTREMITY ANGIOGRAPHY;  Surgeon: Algernon Huxley, MD;  Location: [place] [facility];  Service: Cardiovascular;  Laterality: Left;  ? LOWER EXTREMITY ANGIOGRAPHY Right 01/18/2018  ? Procedure: LOWER EXTREMITY ANGIOGRAPHY;  Surgeon: Algernon Huxley, MD;  Location: [place] [facility];  Service: Cardiovascular;  Laterality: Right;  ? LOWER EXTREMITY ANGIOGRAPHY Left 04/02/2018  ? Procedure: LOWER EXTREMITY ANGIOGRAPHY;  Surgeon: Algernon Huxley, MD;  Location: [place] [facility];  Service: Cardiovascular;  Laterality: Left;  ? LOWER EXTREMITY ANGIOGRAPHY Right 04/09/2018  ? Procedure: Lower Extremity Angiography with possible  intervention;  Surgeon: Algernon Huxley, MD;  Location: [place] [facility];  Service: Cardiovascular;  Laterality: Right;  ? LOWER EXTREMITY ANGIOGRAPHY Right 07/23/2018  ? Procedure: Lower Extremity Angiography;  Surgeon: Algernon Huxley, MD;  Location: [place] [facility];  Service: Cardiovascular;  Laterality: Right;  ? LOWER EXTREMITY ANGIOGRAPHY Right 09/13/2018  ? Procedure: LOWER EXTREMITY ANGIOGRAPHY;  Surgeon: Algernon Huxley, MD;  Location: [place] [facility];  Service: Cardiovascular;  Laterality: Right;  ? LOWER EXTREMITY VENOGRAPHY Right 09/13/2018  ? Procedure: LOWER EXTREMITY VENOGRAPHY;  Surgeon: Algernon Huxley, MD;  Location: [place] [facility];  Service: Cardiovascular;  Laterality: Right;  ? PERIPHERAL VASCULAR CATHETERIZATION Left 09/01/2015  ? Procedure: A/V Shuntogram/Fistulagram;  Surgeon: Katha Cabal, MD;  Location: [place] [facility];  Service: Cardiovascular;  Laterality: Left;  ? PERIPHERAL VASCULAR CATHETERIZATION N/A 09/30/2015  ? Procedure: A/V Shuntogram/Fistulagram with perm cathether removal;  Surgeon: Algernon Huxley, MD;  Location: [place] [facility];  Service: Cardiovascular;  Laterality: N/A;  ? PERIPHERAL VASCULAR CATHETERIZATION Left 09/30/2015  ? Procedure: A/V Shunt Intervention;  Surgeon: Algernon Huxley, MD;  Location: [place] [facility];  Service: Cardiovascular;  Laterality: Left;  ? PERIPHERAL VASCULAR CATHETERIZATION Left 12/03/2015  ?  Procedure: Thrombectomy;  Surgeon: Algernon Huxley, MD;  Location: [place] [facility];  Service: Cardiovascular;  Laterality: Left;  ? PERIPHERAL VASCULAR CATHETERIZATION Left 01/28/2016  ? Procedure: Thrombectomy;  Surgeon: Algernon Huxley, MD;  Location: [place] [facility];  Service: Cardiovascular;  Laterality: Left;  ? PERIPHERAL VASCULAR CATHETERIZATION N/A 01/28/2016  ? Procedure: A/V Shuntogram/Fistulagram;  Surgeon: Algernon Huxley, MD;  Location: [place] [facility];  Service: Cardiovascular;  Laterality: N/A;  ? SKIN SPLIT GRAFT Right 05/24/2018   ? Procedure: SKIN GRAFT SPLIT THICKNESS ( RIGHT CALF);  Surgeon: Algernon Huxley, MD;  Location: [facility];  Service: Vascular;  Laterality: Right;  ? UPPER EXTREMITY ANGIOGRAPHY  10/24/2018  ? Procedure: Upper Extre

## 2021-08-21 DIAGNOSIS — B351 Tinea unguium: Secondary | ICD-10-CM

## 2021-08-21 LAB — CBC WITH DIFFERENTIAL/PLATELET
Abs Immature Granulocytes: 0.07 10*3/uL (ref 0.00–0.07)
Basophils Absolute: 0 10*3/uL (ref 0.0–0.1)
Basophils Relative: 0 %
Eosinophils Absolute: 0 10*3/uL (ref 0.0–0.5)
Eosinophils Relative: 0 %
HCT: 35.5 % — ABNORMAL LOW (ref 39.0–52.0)
Hemoglobin: 10.5 g/dL — ABNORMAL LOW (ref 13.0–17.0)
Immature Granulocytes: 1 %
Lymphocytes Relative: 7 %
Lymphs Abs: 0.9 10*3/uL (ref 0.7–4.0)
MCH: 29 pg (ref 26.0–34.0)
MCHC: 29.6 g/dL — ABNORMAL LOW (ref 30.0–36.0)
MCV: 98.1 fL (ref 80.0–100.0)
Monocytes Absolute: 0.9 10*3/uL (ref 0.1–1.0)
Monocytes Relative: 7 %
Neutro Abs: 11.5 10*3/uL — ABNORMAL HIGH (ref 1.7–7.7)
Neutrophils Relative %: 85 %
Platelets: 193 10*3/uL (ref 150–400)
RBC: 3.62 MIL/uL — ABNORMAL LOW (ref 4.22–5.81)
RDW: 15.8 % — ABNORMAL HIGH (ref 11.5–15.5)
WBC: 13.4 10*3/uL — ABNORMAL HIGH (ref 4.0–10.5)
nRBC: 0 % (ref 0.0–0.2)

## 2021-08-21 LAB — MAGNESIUM: Magnesium: 1.7 mg/dL (ref 1.7–2.4)

## 2021-08-21 LAB — RENAL FUNCTION PANEL
Albumin: 3.4 g/dL — ABNORMAL LOW (ref 3.5–5.0)
Anion gap: 8 (ref 5–15)
BUN: 25 mg/dL — ABNORMAL HIGH (ref 8–23)
CO2: 29 mmol/L (ref 22–32)
Calcium: 8 mg/dL — ABNORMAL LOW (ref 8.9–10.3)
Chloride: 100 mmol/L (ref 98–111)
Creatinine, Ser: 5.69 mg/dL — ABNORMAL HIGH (ref 0.61–1.24)
GFR, Estimated: 10 mL/min — ABNORMAL LOW (ref >60)
Glucose, Bld: 133 mg/dL — ABNORMAL HIGH (ref 70–99)
Phosphorus: 5.6 mg/dL — ABNORMAL HIGH (ref 2.5–4.6)
Potassium: 4.4 mmol/L (ref 3.5–5.1)
Sodium: 137 mmol/L (ref 135–145)

## 2021-08-21 LAB — HEPATITIS B SURFACE ANTIBODY,QUALITATIVE: Hep B S Ab: REACTIVE — AB

## 2021-08-21 LAB — HEPATITIS B SURFACE ANTIBODY, QUANTITATIVE: Hep B S AB Quant (Post): 12.3 m[IU]/mL (ref >9.9)

## 2021-08-21 MED ORDER — OXYCODONE HCL 5 MG PO TABS: 5.0000 mg | ORAL_TABLET | Freq: Four times a day (QID) | ORAL | 0 refills | Status: DC | PRN

## 2021-08-21 MED ORDER — APIXABAN 5 MG PO TABS
5.0000 mg | ORAL_TABLET | Freq: Two times a day (BID) | ORAL | Status: DC
  Administered 2021-08-21: 5 mg via ORAL
  Filled 2021-08-21: qty 1

## 2021-08-21 MED ORDER — APIXABAN 5 MG PO TABS: 5.0000 mg | ORAL_TABLET | Freq: Two times a day (BID) | ORAL | 5 refills | Status: DC

## 2021-08-21 NOTE — Progress Notes (Signed)
Patient is alert and oriented. He is able to take his pills whole. He is on a Renal Carb modified diet with 1200 fluid restriction. He has requested some food. ? ?Patient has a Male [place] on. Agrastat infusing on Left Peripheral IV at 6.9. NSR on telly. Right permacath in Right upper chest clean, dry and intact. ? ?Patient states pain in Left Lower Extremity 9/10. Morphine administered as prescribed. ?

## 2021-08-21 NOTE — Progress Notes (Signed)
?[facility]  ?ROUNDING NOTE  ? ?Subjective:  ?Patient presented with ischemic left lower extremity. ?He underwent vascular surgery intervention this admission. ?Appreciate vascular surgery intervention. ?He had stent placement in the left SFA and popliteal arteries. ?Patient reports left lower extremity feeling much better now. ? ?Objective:  ?Vital signs in last 24 hours:  ?Temp:  [97.7 ?F (36.5 ?C)-98.3 ?F (36.8 ?C)] 98.1 ?F (36.7 ?C) (04/15 3474) ?Pulse Rate:  [0-104] 92 (04/15 0722) ?Resp:  [11-23] 17 (04/15 2595) ?BP: (101-193)/(62-109) 101/62 (04/15 6387) ?SpO2:  [92 %-100 %] 100 % (04/15 0722) ?Weight:  [83.1 kg-85.6 kg] 83.1 kg (04/14 2000) ? ?Weight change: 8.942 kg ?Filed Weights  ? 08/19/21 2221 08/20/21 1545 08/20/21 2000  ?Weight: 76.7 kg 85.6 kg 83.1 kg  ? ? ?Intake/Output: ?I/O last 3 completed shifts: ?In: 209.1 [I.V.:209.1] ?Out: 500 [Other:500] ?  ?Intake/Output this shift: ? No intake/output data recorded. ? ?Physical Exam: ?General: No acute distress  ?Head: Normocephalic, atraumatic. Moist oral mucosal membranes  ?Eyes: Anicteric  ?Neck: Supple  ?Lungs:  Clear to auscultation, normal effort  ?Heart: S1S2 no rubs  ?Abdomen:  Soft, nontender, bowel sounds present  ?Extremities: Right lower extremity amputation,    ?Neurologic: Awake, alert, following commands  ?Skin: No acute rash  ?Access: Right IJ PermCath  ? ? ?Basic Metabolic Panel: ?Recent Labs  ?Lab 08/19/21 ?2212 08/20/21 ?1514 08/21/21 ?0444  ?NA 139 136 137  ?K 4.6 6.2* 4.4  ?CL 102 101 100  ?CO2 24 22 29   ?GLUCOSE 98 94 133*  ?BUN 38* 44* 25*  ?CREATININE 8.51* 9.64* 5.69*  ?CALCIUM 8.4* 8.2* 8.0*  ?MG  --   --  1.7  ?PHOS  --  6.8* 5.6*  ? ? ? ?Liver Function Tests: ?Recent Labs  ?Lab 08/19/21 ?2212 08/20/21 ?1514 08/21/21 ?0444  ?AST 14*  --   --   ?ALT 7  --   --   ?ALKPHOS 67  --   --   ?BILITOT 0.6  --   --   ?PROT 7.1  --   --   ?ALBUMIN 3.5 3.7 3.4*  ? ? ?No results for input(s): LIPASE, AMYLASE in the last 168  hours. ?No results for input(s): AMMONIA in the last 168 hours. ? ?CBC: ?Recent Labs  ?Lab 08/19/21 ?2212 08/20/21 ?1514 08/21/21 ?0444  ?WBC 8.1 10.0 13.4*  ?NEUTROABS 5.5  --  11.5*  ?HGB 10.6* 11.3* 10.5*  ?HCT 35.6* 37.3* 35.5*  ?MCV 98.9 98.4 98.1  ?PLT 155 186 193  ? ? ? ?Cardiac Enzymes: ?No results for input(s): CKTOTAL, CKMB, CKMBINDEX, TROPONINI in the last 168 hours. ? ?BNP: ?Invalid input(s): POCBNP ? ?CBG: ?No results for input(s): GLUCAP in the last 168 hours. ? ?Microbiology: ?Results for orders placed or performed during the hospital encounter of 06/24/20  ?Resp Panel by RT-PCR (Flu A&B, Covid) Nasopharyngeal Swab     Status: Abnormal  ? Collection Time: 06/24/20 10:28 PM  ? Specimen: Nasopharyngeal Swab; Nasopharyngeal(NP) swabs in vial transport medium  ?Result Value Ref Range Status  ? SARS Coronavirus 2 by RT PCR POSITIVE (A) NEGATIVE Final  ?  Comment: RESULT CALLED TO, READ BACK BY AND VERIFIED WITH: ?KANDELL SHEETS @0001  ON 06/25/20 SKL ?(NOTE) ?SARS-CoV-2 target nucleic acids are DETECTED. ? ?The SARS-CoV-2 RNA is generally detectable in upper respiratory ?specimens during the acute phase of infection. Positive results are ?indicative of the presence of the identified virus, but do not rule ?out bacterial infection or co-infection with other pathogens not ?  detected by the test. Clinical correlation with patient history and ?other diagnostic information is necessary to determine patient ?infection status. The expected result is Negative. ? ?Fact Sheet for Patients: ?EntrepreneurPulse.com.au ? ?Fact Sheet for Healthcare Providers: ?IncredibleEmployment.be ? ?This test is not yet approved or cleared by the [place] FDA and  ?has been authorized for detection and/or diagnosis of SARS-CoV-2 by ?FDA under an Emergency Use Authorization (EUA).  This EUA will ?remain in effect (meaning this test can  be used) for the duration of  ?the COVID-19 declaration under  Section 564(b)(1) of the Act, 21 ?U.S.C. section 360bbb-3(b)(1), unless the authorization is ?terminated or revoked sooner. ? ?  ? Influenza A by PCR NEGATIVE NEGATIVE Final  ? Influenza B by PCR NEGATIVE NEGATIVE Final  ?  Comment: (NOTE) ?The Xpert Xpress SARS-CoV-2/FLU/RSV plus assay is intended as an aid ?in the diagnosis of influenza from Nasopharyngeal swab specimens and ?should not be used as a sole basis for treatment. Nasal washings and ?aspirates are unacceptable for Xpert Xpress SARS-CoV-2/FLU/RSV ?testing. ? ?Fact Sheet for Patients: ?EntrepreneurPulse.com.au ? ?Fact Sheet for Healthcare Providers: ?IncredibleEmployment.be ? ?This test is not yet approved or cleared by the [place] FDA and ?has been authorized for detection and/or diagnosis of SARS-CoV-2 by ?FDA under an Emergency Use Authorization (EUA). This EUA will remain ?in effect (meaning this test can be used) for the duration of the ?COVID-19 declaration under Section 564(b)(1) of the Act, 21 U.S.C. ?section 360bbb-3(b)(1), unless the authorization is terminated or ?revoked. ? ?Performed at [facility], [place], ?[place] [address] ?  ?CULTURE, BLOOD (ROUTINE X 2) w Reflex to ID Panel     Status: None  ? Collection Time: 06/25/20  5:03 AM  ? Specimen: BLOOD  ?Result Value Ref Range Status  ? Specimen Description BLOOD RIGHT FA  Final  ? Special Requests   Final  ?  BOTTLES DRAWN AEROBIC AND ANAEROBIC Blood Culture adequate volume  ? Culture   Final  ?  NO GROWTH 5 DAYS ?Performed at [facility], [address] [place], [place] [address] ?  ? Report Status 06/30/2020 FINAL  Final  ?CULTURE, BLOOD (ROUTINE X 2) w Reflex to ID Panel     Status: None  ? Collection Time: 06/25/20  5:03 AM  ? Specimen: BLOOD  ?Result Value Ref Range Status  ? Specimen Description BLOOD LEFT AC  Final  ? Special Requests   Final  ?  BOTTLES DRAWN AEROBIC AND ANAEROBIC Blood Culture results  may not be optimal due to an excessive volume of blood received in culture bottles  ? Culture   Final  ?  NO GROWTH 5 DAYS ?Performed at [facility], [address] [place], [place] [address] ?  ? Report Status 06/30/2020 FINAL  Final  ? ? ?Coagulation Studies: ?Recent Labs  ?  08/19/21 ?2244  ?LABPROT 14.3  ?INR 1.1  ? ? ? ?Urinalysis: ?No results for input(s): COLORURINE, LABSPEC, [place], GLUCOSEU, HGBUR, BILIRUBINUR, KETONESUR, PROTEINUR, UROBILINOGEN, NITRITE, LEUKOCYTESUR in the last 72 hours. ? ?Invalid input(s): APPERANCEUR  ? ? ?Imaging: ?CT Angio Aortobifemoral W and/or Wo Contrast ? ?Result Date: 08/19/2021 ?CLINICAL DATA:  History of prior right-sided amputation with left lower extremity leg pain and numbness, evaluate for EXAM: CT ANGIOGRAPHY OF ABDOMINAL AORTA WITH ILIOFEMORAL RUNOFF TECHNIQUE: Multidetector CT imaging of the abdomen, pelvis and lower extremities was performed using the standard protocol during bolus administration of intravenous contrast. Multiplanar CT image reconstructions and  MIPs were obtained to evaluate the vascular anatomy. RADIATION DOSE REDUCTION: This exam was performed according to the departmental dose-optimization program which includes automated exposure control, adjustment of the mA and/or kV according to patient size and/or use of iterative reconstruction technique. CONTRAST:  154mL OMNIPAQUE IOHEXOL 350 MG/ML SOLN COMPARISON:  None. FINDINGS: VASCULAR Aorta: Atherosclerotic calcifications of the abdominal aorta are noted. No aneurysmal dilatation or dissection is seen. Celiac: Calcific changes are noted at the origin of the celiac axis although no focal hemodynamically significant stenosis is noted. SMA: Atherosclerotic changes are noted at the origin of the superior mesenteric artery although no focal hemodynamically significant stenosis is seen. Renals: Dual renal arteries are noted on the right with single renal artery on the left. Diffuse  atherosclerotic calcifications are noted although no focal hemodynamically significant stenosis is seen. Vascular calcifications are noted throughout the renal artery which likely contribute to the patient's end-stage ren

## 2021-08-21 NOTE — Progress Notes (Addendum)
Start of Shift Report received from RN, Gretta Cool ? ?71 year old male, Full Code, complaining of Right Leg pain, coldness and numbness. ? ?Past Medical History of Obstructive Sleep Apnea on 4L [place], CHF, HTN. ? ?Right Above the Knee Amputation for Peripheral Vascular Disease ? ?Patient is currently in Dialysis. Patient has Dialysis Monday, Wednesday and Friday ?for end-stage renal disease. ? ?VTE: Heparin ? ? ? ?

## 2021-08-21 NOTE — Consult Note (Signed)
PODIATRY / FOOT AND ANKLE SURGERY CONSULTATION NOTE ? ?Requesting Physician: Dr. Sunday Spillers ? ?Reason for consult: Nails ? ?Chief Complaint: Nails are long and hurt ?  ?HPI: Johnny [name]. is a 71 y.o. male who presented to [facility] due to critical limb ischemia.  Patient had revascularization procedure with Dr. Lucky Cowboy.  Patient is resting in bed today comfortably not complaining of any pain to the left lower extremity and believes that the procedure has helped with his pain discomfort.  Patient has a previous right below-knee amputation.  Patient does not have a history of diabetes but has a significant history of peripheral vascular disease.  Patient normally sees Dr. Vickki Muff apparently as an outpatient.  Podiatry was consulted for evaluation of left foot.  Patient complains of elongated and thickened toenails of the left foot.  Patient is worried about cutting them himself due to potential for cutting his skin which could lead to a nonhealing wound due to his poor circulation. ? ?PMHx:  ?Past Medical History:  ?Diagnosis Date  ? Anemia   ? Anxiety   ? CHF (congestive heart failure) ([place])   ? Chronic kidney disease   ? esrd dialysis m/w/f  ? Dialysis patient [facility])   ? M, W, F  ? Gout   ? Hyperlipidemia   ? Hypertension   ? Myocardial infarction [facility]) 2010  ? 10 years ago  ? Neuromuscular disorder ([place]) 2020  ? 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  ? Peripheral vascular disease ([place])   ? S/P BKA (below knee amputation), right ([place])   ? Wears dentures   ? full upper and lower  ? ? ?Surgical Hx:  ?Past Surgical History:  ?Procedure Laterality Date  ? A/V FISTULAGRAM Right 09/06/2018  ? Procedure: A/V FISTULAGRAM;  Surgeon: Algernon Huxley, MD;  Location: [place] [facility];  Service: Cardiovascular;  Laterality: Right;  ? A/V SHUNTOGRAM Left 06/21/2017  ? Procedure: A/V SHUNTOGRAM;  Surgeon: Katha Cabal, MD;  Location: [place] [facility];  Service: Cardiovascular;  Laterality: Left;  ? A/V SHUNTOGRAM N/A  10/24/2018  ? Procedure: A/V SHUNTOGRAM;  Surgeon: Algernon Huxley, MD;  Location: [place] [facility];  Service: Cardiovascular;  Laterality: N/A;  ? ABOVE KNEE LEG AMPUTATION Right 2020  ? AMPUTATION Right 10/25/2018  ? Procedure: AMPUTATION ABOVE KNEE;  Surgeon: Algernon Huxley, MD;  Location: [facility];  Service: General;  Laterality: Right;  ? APPLICATION OF WOUND VAC Right 04/11/2018  ? Procedure: APPLICATION OF WOUND VAC;  Surgeon: Algernon Huxley, MD;  Location: [facility];  Service: Vascular;  Laterality: Right;  ? AV FISTULA PLACEMENT Left 09/18/2015  ? Procedure: INSERTION OF ARTERIOVENOUS (AV) GORE-TEX GRAFT ARM ( BRACH/AXILLARY GRAFT W/ INSTANT STICK GRAFT );  Surgeon: Katha Cabal, MD;  Location: [facility];  Service: Vascular;  Laterality: Left;  ? AV FISTULA PLACEMENT Right 07/19/2018  ? Procedure: INSERTION OF ARTERIOVENOUS (AV) GORE-TEX GRAFT ARM ( BRACHIAL AXILLARY);  Surgeon: Algernon Huxley, MD;  Location: [facility];  Service: Vascular;  Laterality: Right;  ? CATARACT EXTRACTION W/PHACO Left 07/01/2021  ? Procedure: CATARACT EXTRACTION PHACO AND INTRAOCULAR LENS PLACEMENT (IOC) LEFT 7.92 00:47.3;  Surgeon: Eulogio Bear, MD;  Location: [facility];  Service: Ophthalmology;  Laterality: Left;  ? DIALYSIS FISTULA CREATION Right 10/2017  ? right chest perm cath  ? DIALYSIS/PERMA CATHETER INSERTION N/A 06/09/2020  ? Procedure: DIALYSIS/PERMA CATHETER INSERTION;  Surgeon: Algernon Huxley, MD;  Location: [place] [facility];  Service: Cardiovascular;  Laterality: N/A;  ? DIALYSIS/PERMA CATHETER INSERTION N/A 07/30/2020  ? Procedure: DIALYSIS/PERMA CATHETER EXCHANGE;  Surgeon: Algernon Huxley, MD;  Location: [place] [facility];  Service: Cardiovascular;  Laterality: N/A;  ? DIALYSIS/PERMA CATHETER REMOVAL N/A 09/13/2018  ? Procedure: DIALYSIS/PERMA CATHETER REMOVAL;  Surgeon: Algernon Huxley, MD;  Location: [place] [facility];  Service: Cardiovascular;  Laterality: N/A;  ? ESOPHAGOGASTRODUODENOSCOPY N/A 12/19/2017  ? Procedure:  ESOPHAGOGASTRODUODENOSCOPY (EGD);  Surgeon: Lin Landsman, MD;  Location: [facility];  Service: Gastroenterology;  Laterality: N/A;  ? LOWER EXTREMITY ANGIOGRAPHY Left 11/16/2017  ? Procedure: LOWER EXTREMITY ANGIOGRAPHY;  Surgeon: Algernon Huxley, MD;  Location: [place] [facility];  Service: Cardiovascular;  Laterality: Left;  ? LOWER EXTREMITY ANGIOGRAPHY Right 01/18/2018  ? Procedure: LOWER EXTREMITY ANGIOGRAPHY;  Surgeon: Algernon Huxley, MD;  Location: [place] [facility];  Service: Cardiovascular;  Laterality: Right;  ? LOWER EXTREMITY ANGIOGRAPHY Left 04/02/2018  ? Procedure: LOWER EXTREMITY ANGIOGRAPHY;  Surgeon: Algernon Huxley, MD;  Location: [place] [facility];  Service: Cardiovascular;  Laterality: Left;  ? LOWER EXTREMITY ANGIOGRAPHY Right 04/09/2018  ? Procedure: Lower Extremity Angiography with possible intervention;  Surgeon: Algernon Huxley, MD;  Location: [place] [facility];  Service: Cardiovascular;  Laterality: Right;  ? LOWER EXTREMITY ANGIOGRAPHY Right 07/23/2018  ? Procedure: Lower Extremity Angiography;  Surgeon: Algernon Huxley, MD;  Location: [place] [facility];  Service: Cardiovascular;  Laterality: Right;  ? LOWER EXTREMITY ANGIOGRAPHY Right 09/13/2018  ? Procedure: LOWER EXTREMITY ANGIOGRAPHY;  Surgeon: Algernon Huxley, MD;  Location: [place] [facility];  Service: Cardiovascular;  Laterality: Right;  ? LOWER EXTREMITY ANGIOGRAPHY Left 08/20/2021  ? Procedure: Lower Extremity Angiography;  Surgeon: Algernon Huxley, MD;  Location: [place] [facility];  Service: Cardiovascular;  Laterality: Left;  ? LOWER EXTREMITY VENOGRAPHY Right 09/13/2018  ? Procedure: LOWER EXTREMITY VENOGRAPHY;  Surgeon: Algernon Huxley, MD;  Location: [place] [facility];  Service: Cardiovascular;  Laterality: Right;  ? PERIPHERAL VASCULAR CATHETERIZATION Left 09/01/2015  ? Procedure: A/V Shuntogram/Fistulagram;  Surgeon: Katha Cabal, MD;  Location: [place] [facility];  Service: Cardiovascular;  Laterality: Left;  ?  PERIPHERAL VASCULAR CATHETERIZATION N/A 09/30/2015  ? Procedure: A/V Shuntogram/Fistulagram with perm cathether removal;  Surgeon: Algernon Huxley, MD;  Location: [place] [facility];  Service: Cardiovascular;  Laterality: N/A;  ? PERIPHERAL VASCULAR CATHETERIZATION Left 09/30/2015  ? Procedure: A/V Shunt Intervention;  Surgeon: Algernon Huxley, MD;  Location: [place] [facility];  Service: Cardiovascular;  Laterality: Left;  ? PERIPHERAL VASCULAR CATHETERIZATION Left 12/03/2015  ? Procedure: Thrombectomy;  Surgeon: Algernon Huxley, MD;  Location: [place] [facility];  Service: Cardiovascular;  Laterality: Left;  ? PERIPHERAL VASCULAR CATHETERIZATION Left 01/28/2016  ? Procedure: Thrombectomy;  Surgeon: Algernon Huxley, MD;  Location: [place] [facility];  Service: Cardiovascular;  Laterality: Left;  ? PERIPHERAL VASCULAR CATHETERIZATION N/A 01/28/2016  ? Procedure: A/V Shuntogram/Fistulagram;  Surgeon: Algernon Huxley, MD;  Location: [place] [facility];  Service: Cardiovascular;  Laterality: N/A;  ? SKIN SPLIT GRAFT Right 05/24/2018  ? Procedure: SKIN GRAFT SPLIT THICKNESS ( RIGHT CALF);  Surgeon: Algernon Huxley, MD;  Location: [facility];  Service: Vascular;  Laterality: Right;  ? UPPER EXTREMITY ANGIOGRAPHY  10/24/2018  ? Procedure: Upper Extremity Angiography;  Surgeon: Algernon Huxley, MD;  Location: [place] [facility];  Service: Cardiovascular;;  ? UPPER EXTREMITY ANGIOGRAPHY Right 02/14/2019  ? Procedure: UPPER EXTREMITY ANGIOGRAPHY;  Surgeon: Algernon Huxley, MD;  Location: [place] [facility];  Service: Cardiovascular;  Laterality: Right;  ? UPPER EXTREMITY ANGIOGRAPHY Right 04/20/2020  ? Procedure: UPPER EXTREMITY ANGIOGRAPHY;  Surgeon: Algernon Huxley, MD;  Location: [place] [facility];  Service: Cardiovascular;  Laterality: Right;  ? WOUND DEBRIDEMENT Right 04/11/2018  ? Procedure: DEBRIDEMENT WOUND calf muscle and skin;  Surgeon: Algernon Huxley, MD;  Location: [facility];  Service: Vascular;  Laterality: Right;  ? ? ?FHx:  ?Family  History  ?Problem Relation Age of Onset  ? Hypertension Other   ? Heart disease Other   ? Diabetes Mother   ? ? ?Social History:  reports that he quit smoking about 16 years ago. His smoking use included cigarettes.

## 2021-08-21 NOTE — Discharge Summary (Signed)
?Physician Discharge Summary ?  ?Patient: Johnny [name]. MRN: [id number] DOB: 04/23/1951  ?Admit date:     08/19/2021  ?Discharge date: 08/21/21  ?Discharge Physician: Dwyane Dee  ? ?PCP: [name], MD  ? ?Recommendations at discharge:  ? ? Follow up with vascular surgery ?Follow up with podiatry for nail care ?Continue HD ? ?Discharge Diagnoses: ?Principal Problem: ?  Acute lower limb ischemia, left ?Active Problems: ?  PVD (peripheral vascular disease) ([place]) ?  ESRD on hemodialysis ([place]) ?  Above knee amputation of right lower extremity ([place]) ?  Hypertension ?  Chronic diastolic CHF (congestive heart failure) ([place]) ?  Onychomycosis ? ?Resolved Problems: ?  * No resolved hospital problems. * ? ?Hospital Course: ?Mr. Vidal is a 71 yo male with PMH ESRD, HTN, HLD, CHF, PVD s/p R AKA who presented with left leg pain and numbness to the foot.  ?He underwent CT angio of the left lower extremity on admission which showed occlusion of the left SFA and occluded stents.  He was evaluated by vascular surgery and taken for angiography after heparin drip was started on admission as well. ?He underwent angioplasty and placement of 3 stents during procedure.  He was transitioned to Aggrastat after procedure.  On discharge he was started on Eliquis and Plavix was discontinued. ? ?Assessment and Plan: ?* Acute lower limb ischemia, left ?- Occlusion of multiple vessels on left lower extremity noted on CT angio on admission ?- Started on heparin drip on admission ?- Taken for angiography by vascular surgery on 08/20/2021.  3 stents placed with angioplasty performed as well.   ?-Continue aspirin and Eliquis at discharge.  Plavix discontinued per vascular recommendations ? ?PVD (peripheral vascular disease) ([place]) ?- On aspirin, Plavix, Lipitor at home ?- Currently on Aggrastat ? ?ESRD on hemodialysis ([place]) ?- Nephrology consult for continuation of dialysis ?-Patient going for dialysis on Friday after angiography ? ?Above  knee amputation of right lower extremity ([place]) ?Increase nursing assistance for transfers ? ?Onychomycosis ?- Followed outpatient by podiatry in the past.  Typically sees Dr. Vickki Muff ?-Per vascular request, podiatry consulted prior to discharge to see if nails able to be debrided; was able to be done and left foot nails debrided x5.  Patient tolerated well ?-Further outpatient care with podiatry ? ?Chronic diastolic CHF (congestive heart failure) ([place]) ?Not acutely exacerbated ?Continue carvedilol, losartan ? ?Hypertension ?Continue carvedilol ? ? ? ? ?  ? ? ?Consultants:  ?Nephrology ?Vascular surgery ?Podiatry  ? ?Procedures performed:  ?Left lower extremity angiography, angioplasty, stent placement x 3, 08/20/2021  ?Disposition: Home ?Diet recommendation:  ?Discharge Diet Orders (From admission, onward)  ? ?  Start     Ordered  ? 08/21/21 0000  Diet - low sodium heart healthy       ? 08/21/21 1023  ? 08/21/21 0000  Diet Carb Modified       ? 08/21/21 1023  ? ?  ?  ? ?  ? ?Cardiac and Carb modified diet ?DISCHARGE MEDICATION: ?Allergies as of 08/21/2021   ? ?   Reactions  ? Shellfish Allergy Anaphylaxis  ? Other   ? ?  ? ?  ?Medication List  ?  ? ?STOP taking these medications   ? ?clopidogrel 75 MG tablet ?Commonly known as: Plavix ?  ?hydrALAZINE 25 MG tablet ?Commonly known as: APRESOLINE ?  ? ?  ? ?TAKE these medications   ? ?allopurinol 100 MG tablet ?Commonly known as: ZYLOPRIM ?Take 100 mg by mouth every evening. ?  ?  apixaban 5 MG Tabs tablet ?Commonly known as: ELIQUIS ?Take 1 tablet (5 mg total) by mouth 2 (two) times daily. ?  ?aspirin EC 81 MG tablet ?Take 1 tablet (81 mg total) by mouth daily. ?  ?atorvastatin 10 MG tablet ?Commonly known as: LIPITOR ?Take 10 mg by mouth at bedtime. ?  ?carvedilol 3.125 MG tablet ?Commonly known as: COREG ?Take 3.125 mg by mouth 2 (two) times daily. ?  ?clonazePAM 0.5 MG tablet ?Commonly known as: KLONOPIN ?Take 1 tablet (0.5 mg total) by mouth as needed. ?What changed:  when to take this ?  ?folic acid-vitamin b complex-vitamin c-selenium-zinc 3 MG Tabs tablet ?Take 1 tablet by mouth daily. ?  ?gabapentin 100 MG capsule ?Commonly known as: NEURONTIN ?Take 2 capsules (200 mg total) by mouth 2 (two) times daily. After dialysis session ?What changed:  ?how much to take ?when to take this ?additional instructions ?  ?losartan 100 MG tablet ?Commonly known as: COZAAR ?Hold until followup with outpatient doctor due to intermittently low blood pressure. ?What changed:  ?how much to take ?how to take this ?when to take this ?additional instructions ?  ?oxyCODONE 5 MG immediate release tablet ?Commonly known as: Oxy IR/ROXICODONE ?Take 1 tablet (5 mg total) by mouth every 6 (six) hours as needed for severe pain or breakthrough pain. ?  ?sevelamer carbonate 800 MG tablet ?Commonly known as: RENVELA ?Take 2,400 mg by mouth 2 (two) times daily. ?  ?vitamin C 500 MG tablet ?Commonly known as: ASCORBIC ACID ?Take 500 mg by mouth daily. ?  ? ?  ? ? Follow-up Information   ? ? Samara Deist, DPM Follow up in 3 month(s).   ?Specialty: Podiatry ?Why: Routine foot care/PVD ?Contact information: ?[address] ?[place] [address] ?[phone] ? ? ?  ?  ? ?  ?  ? ?  ? ?Discharge Exam: ?Filed Weights  ? 08/19/21 2221 08/20/21 1545 08/20/21 2000  ?Weight: 76.7 kg 85.6 kg 83.1 kg  ? ?Physical Exam ?Constitutional:   ?   Appearance: Normal appearance.  ?HENT:  ?   Head: Normocephalic and atraumatic.  ?   Mouth/Throat:  ?   Mouth: Mucous membranes are moist.  ?Eyes:  ?   Extraocular Movements: Extraocular movements intact.  ?Cardiovascular:  ?   Rate and Rhythm: Normal rate and regular rhythm.  ?Pulmonary:  ?   Effort: Pulmonary effort is normal.  ?   Breath sounds: Normal breath sounds.  ?Abdominal:  ?   General: Bowel sounds are normal. There is no distension.  ?   Palpations: Abdomen is soft.  ?   Tenderness: There is no abdominal tenderness.  ?Musculoskeletal:     ?   General: Normal range  of motion.  ?   Cervical back: Normal range of motion and neck supple.  ?   Comments: R AKA noted; LLE no TTP; unable to palpate DP  ?Skin: ?   General: Skin is warm and dry.  ?Neurological:  ?   Mental Status: He is alert and oriented to person, place, and time.  ?Psychiatric:     ?   Mood and Affect: Mood normal.     ?   Behavior: Behavior normal.  ? ? ? ?Condition at discharge: stable ? ?The results of significant diagnostics from this hospitalization (including imaging, microbiology, ancillary and laboratory) are listed below for reference.  ? ?Imaging Studies: ?CT Angio Aortobifemoral W and/or Wo Contrast ? ?Result Date: 08/19/2021 ?CLINICAL DATA:  History of prior right-sided  amputation with left lower extremity leg pain and numbness, evaluate for EXAM: CT ANGIOGRAPHY OF ABDOMINAL AORTA WITH ILIOFEMORAL RUNOFF TECHNIQUE: Multidetector CT imaging of the abdomen, pelvis and lower extremities was performed using the standard protocol during bolus administration of intravenous contrast. Multiplanar CT image reconstructions and MIPs were obtained to evaluate the vascular anatomy. RADIATION DOSE REDUCTION: This exam was performed according to the departmental dose-optimization program which includes automated exposure control, adjustment of the mA and/or kV according to patient size and/or use of iterative reconstruction technique. CONTRAST:  170mL OMNIPAQUE IOHEXOL 350 MG/ML SOLN COMPARISON:  None. FINDINGS: VASCULAR Aorta: Atherosclerotic calcifications of the abdominal aorta are noted. No aneurysmal dilatation or dissection is seen. Celiac: Calcific changes are noted at the origin of the celiac axis although no focal hemodynamically significant stenosis is noted. SMA: Atherosclerotic changes are noted at the origin of the superior mesenteric artery although no focal hemodynamically significant stenosis is seen. Renals: Dual renal arteries are noted on the right with single renal artery on the left. Diffuse  atherosclerotic calcifications are noted although no focal hemodynamically significant stenosis is seen. Vascular calcifications are noted throughout the renal artery which likely contribute to the patient's

## 2021-08-21 NOTE — Progress Notes (Signed)
Tirofiban discontinued ?

## 2021-08-21 NOTE — Progress Notes (Addendum)
Patient is back on the unit from Dialysis. ? ?Shift report from [place], RN 1L of fluid removed. Procedure tolerated well. ?B/P is 122/68. Patient is on 2L of Oxygen.  ?

## 2021-08-21 NOTE — Progress Notes (Addendum)
1 Day Post-Op  ? ?Subjective/Chief Complaint: ?States his leg feels much better. Denies pain. ? ? ?Objective: ?Vital signs in last 24 hours: ?Temp:  [97.7 ?F (36.5 ?C)-98.3 ?F (36.8 ?C)] 98.1 ?F (36.7 ?C) (04/15 6269) ?Pulse Rate:  [0-104] 92 (04/15 0722) ?Resp:  [11-23] 17 (04/15 4854) ?BP: (101-193)/(62-109) 101/62 (04/15 6270) ?SpO2:  [92 %-100 %] 100 % (04/15 0722) ?Weight:  [83.1 kg-85.6 kg] 83.1 kg (04/14 2000) ?Last BM Date : 08/18/21 ? ?Intake/Output from previous day: ?04/14 0701 - 04/15 0700 ?In: 108.2 [I.V.:108.2] ?Out: 500  ?Intake/Output this shift: ?No intake/output data recorded. ? ?General appearance: alert and no distress ?Cardio: regular rate and rhythm ?Extremities: RIGHT access site- soft, dry, no hematoma. LEFT leg- warm, +motor/+sensory ? ?Lab Results:  ?Recent Labs  ?  08/20/21 ?1514 08/21/21 ?0444  ?WBC 10.0 13.4*  ?HGB 11.3* 10.5*  ?HCT 37.3* 35.5*  ?PLT 186 193  ? ?BMET ?Recent Labs  ?  08/20/21 ?1514 08/21/21 ?0444  ?NA 136 137  ?K 6.2* 4.4  ?CL 101 100  ?CO2 22 29  ?GLUCOSE 94 133*  ?BUN 44* 25*  ?CREATININE 9.64* 5.69*  ?CALCIUM 8.2* 8.0*  ? ?PT/INR ?Recent Labs  ?  08/19/21 ?2244  ?LABPROT 14.3  ?INR 1.1  ? ?ABG ?No results for input(s): PHART, HCO3 in the last 72 hours. ? ?Invalid input(s): PCO2, PO2 ? ?Studies/Results: ?CT Angio Aortobifemoral W and/or Wo Contrast ? ?Result Date: 08/19/2021 ?CLINICAL DATA:  History of prior right-sided amputation with left lower extremity leg pain and numbness, evaluate for EXAM: CT ANGIOGRAPHY OF ABDOMINAL AORTA WITH ILIOFEMORAL RUNOFF TECHNIQUE: Multidetector CT imaging of the abdomen, pelvis and lower extremities was performed using the standard protocol during bolus administration of intravenous contrast. Multiplanar CT image reconstructions and MIPs were obtained to evaluate the vascular anatomy. RADIATION DOSE REDUCTION: This exam was performed according to the departmental dose-optimization program which includes automated exposure control,  adjustment of the mA and/or kV according to patient size and/or use of iterative reconstruction technique. CONTRAST:  129mL OMNIPAQUE IOHEXOL 350 MG/ML SOLN COMPARISON:  None. FINDINGS: VASCULAR Aorta: Atherosclerotic calcifications of the abdominal aorta are noted. No aneurysmal dilatation or dissection is seen. Celiac: Calcific changes are noted at the origin of the celiac axis although no focal hemodynamically significant stenosis is noted. SMA: Atherosclerotic changes are noted at the origin of the superior mesenteric artery although no focal hemodynamically significant stenosis is seen. Renals: Dual renal arteries are noted on the right with single renal artery on the left. Diffuse atherosclerotic calcifications are noted although no focal hemodynamically significant stenosis is seen. Vascular calcifications are noted throughout the renal artery which likely contribute to the patient's end-stage renal disease. IMA: Patent without evidence of aneurysm, dissection, vasculitis or significant stenosis. RIGHT Lower Extremity Inflow: Atherosclerotic calcifications are noted in the iliac arteries without focal hemodynamically significant stenosis. Runoff: Common femoral artery is patent although heavily calcified. Bifurcation is patent and the residual right superficial femoral artery is patent to the mid thigh where a stent was previously placed. The stent is occluded and multiple muscular collaterals are noted. Above knee amputation is noted. The distal vasculature is not evaluated. LEFT Lower Extremity Inflow: Diffuse atherosclerotic calcifications are noted without focal hemodynamically significant stenosis Runoff: Common femoral artery and femoral bifurcation are patent. Postsurgical changes are noted in the left inguinal region. The superficial femoral artery is patent with heavy atherosclerotic calcifications identified. Occlusion of the superficial femoral artery is noted distally where a long segment of  vascular stenting is noted. The stent is occluded extending into the midportion of the popliteal artery. Multiple muscular collaterals reconstitute popliteal artery just beyond the distal aspect of the stent. Popliteal trifurcation is noted. Heavy calcifications are seen. Multifocal areas of narrowing are noted throughout the anterior and posterior tibial arteries. The peroneal artery is the dominant runoff vessel to the ankle with collateral flow to both the anterior tibial and posterior tibial arteries which continue into the foot. Veins: No specific venous abnormality is noted. Review of the MIP images confirms the above findings. NON-VASCULAR Lower chest: Mild basilar atelectasis is noted. Hepatobiliary: No focal liver abnormality is seen. No gallstones, gallbladder wall thickening, or biliary dilatation. Pancreas: Unremarkable. No pancreatic ductal dilatation or surrounding inflammatory changes. Spleen: Normal in size without focal abnormality. Adrenals/Urinary Tract: Adrenal glands are within normal limits. Kidneys demonstrate multiple cystic lesions bilaterally as well as multifocal areas of arterial calcification consistent with the known history of end-stage renal disease. Mild perinephric stranding is noted. No obstructive changes are seen. The bladder is decompressed. Stomach/Bowel: The colon shows no obstructive or inflammatory changes. The appendix is air-filled and within normal limits. Small bowel and stomach are unremarkable. Lymphatic: No sizable lymphadenopathy is noted. Reproductive: Prostate is unremarkable. Other: Bilateral fat containing inguinal hernias are noted as well as a fat containing umbilical hernia. No bowel is noted within these hernias. No free fluid is noted. Musculoskeletal: Changes consistent with above knee amputation on the right. Degenerative changes of lumbar spine are noted. No acute bony abnormality is noted. IMPRESSION: VASCULAR Changes consistent with the known right  above knee amputation with occlusion of the midportion of the right superficial femoral artery just above a previously placed vascular stent. Occlusion of the left superficial femoral artery in the mid thigh just above a long segment of arterial stenting. The stents are occluded and there is reconstitution of the popliteal artery of via muscular collaterals just below the distal aspect of the arterial stents. Dominant runoff to the left ankle is via the peroneal artery with distal collateral flow to the anterior and posterior tibial arteries. Multifocal areas of narrowing in the anterior and posterior tibial artery are noted in the calf. NON-VASCULAR Bibasilar atelectasis. Changes consistent with end-stage renal disease. Bilateral inguinal and umbilical hernias containing fat. Electronically Signed   By: Inez Catalina M.D.   On: 08/19/2021 23:46  ? ?PERIPHERAL VASCULAR CATHETERIZATION ? ?Result Date: 08/20/2021 ?See surgical note for result.  ? ?Anti-infectives: ?Anti-infectives (From admission, onward)  ? ? Start     Dose/Rate Route Frequency Ordered Stop  ? 08/20/21 0823  ceFAZolin (ANCEF) IVPB 1 g/50 mL premix       ? 1 g ?100 mL/hr over 30 Minutes Intravenous 30 min pre-op 08/20/21 2836 08/20/21 1110  ? ?  ? ? ?Assessment/Plan: ?s/p Procedure(s): ?Lower Extremity Angiography (Left) ?Continue ELIQUIS upon discharge - discontinue Plavix ?OOB as tolerated with PT ?OK from Vascular standpoint for D/C ?Will need Podiatry consult for toenail trimming prior to discharge ?Follow up in office in 4 weeks- Dr. Lucky Cowboy ? LOS: 1 day  ? ? ?Sheryle Vice A ?08/21/2021 ? ?

## 2021-08-21 NOTE — Assessment & Plan Note (Signed)
-   Followed outpatient by podiatry in the past.  Typically sees Dr. Vickki Muff ?-Per vascular request, podiatry consulted prior to discharge to see if nails able to be debrided; was able to be done and left foot nails debrided x5.  Patient tolerated well ?-Further outpatient care with podiatry ?

## 2021-08-24 ENCOUNTER — Emergency Department: Payer: Medicare Other

## 2021-08-24 ENCOUNTER — Encounter: Payer: Self-pay | Admitting: Emergency Medicine

## 2021-08-24 DIAGNOSIS — Z992 Dependence on renal dialysis: Secondary | ICD-10-CM | POA: Diagnosis not present

## 2021-08-24 DIAGNOSIS — G8918 Other acute postprocedural pain: Secondary | ICD-10-CM | POA: Insufficient documentation

## 2021-08-24 DIAGNOSIS — Z7982 Long term (current) use of aspirin: Secondary | ICD-10-CM | POA: Diagnosis not present

## 2021-08-24 DIAGNOSIS — D72829 Elevated white blood cell count, unspecified: Secondary | ICD-10-CM | POA: Insufficient documentation

## 2021-08-24 DIAGNOSIS — X58XXXA Exposure to other specified factors, initial encounter: Secondary | ICD-10-CM | POA: Diagnosis not present

## 2021-08-24 DIAGNOSIS — S301XXA Contusion of abdominal wall, initial encounter: Secondary | ICD-10-CM | POA: Insufficient documentation

## 2021-08-24 DIAGNOSIS — Z79899 Other long term (current) drug therapy: Secondary | ICD-10-CM | POA: Diagnosis not present

## 2021-08-24 DIAGNOSIS — N186 End stage renal disease: Secondary | ICD-10-CM | POA: Insufficient documentation

## 2021-08-24 DIAGNOSIS — S3991XA Unspecified injury of abdomen, initial encounter: Secondary | ICD-10-CM | POA: Diagnosis present

## 2021-08-24 DIAGNOSIS — D649 Anemia, unspecified: Secondary | ICD-10-CM | POA: Insufficient documentation

## 2021-08-24 DIAGNOSIS — M79605 Pain in left leg: Secondary | ICD-10-CM | POA: Insufficient documentation

## 2021-08-24 DIAGNOSIS — I132 Hypertensive heart and chronic kidney disease with heart failure and with stage 5 chronic kidney disease, or end stage renal disease: Secondary | ICD-10-CM | POA: Diagnosis not present

## 2021-08-24 DIAGNOSIS — I509 Heart failure, unspecified: Secondary | ICD-10-CM | POA: Diagnosis not present

## 2021-08-24 DIAGNOSIS — Z7901 Long term (current) use of anticoagulants: Secondary | ICD-10-CM | POA: Insufficient documentation

## 2021-08-24 LAB — CBC WITH DIFFERENTIAL/PLATELET
Abs Immature Granulocytes: 0.03 10*3/uL (ref 0.00–0.07)
Basophils Absolute: 0.1 10*3/uL (ref 0.0–0.1)
Basophils Relative: 1 %
Eosinophils Absolute: 0.3 10*3/uL (ref 0.0–0.5)
Eosinophils Relative: 3 %
HCT: 35.9 % — ABNORMAL LOW (ref 39.0–52.0)
Hemoglobin: 10.7 g/dL — ABNORMAL LOW (ref 13.0–17.0)
Immature Granulocytes: 0 %
Lymphocytes Relative: 15 %
Lymphs Abs: 1.6 10*3/uL (ref 0.7–4.0)
MCH: 29.3 pg (ref 26.0–34.0)
MCHC: 29.8 g/dL — ABNORMAL LOW (ref 30.0–36.0)
MCV: 98.4 fL (ref 80.0–100.0)
Monocytes Absolute: 1.4 10*3/uL — ABNORMAL HIGH (ref 0.1–1.0)
Monocytes Relative: 13 %
Neutro Abs: 7.5 10*3/uL (ref 1.7–7.7)
Neutrophils Relative %: 68 %
Platelets: 221 10*3/uL (ref 150–400)
RBC: 3.65 MIL/uL — ABNORMAL LOW (ref 4.22–5.81)
RDW: 16.2 % — ABNORMAL HIGH (ref 11.5–15.5)
WBC: 10.9 10*3/uL — ABNORMAL HIGH (ref 4.0–10.5)
nRBC: 0 % (ref 0.0–0.2)

## 2021-08-24 LAB — COMPREHENSIVE METABOLIC PANEL
ALT: <5 U/L (ref 0–44)
AST: 11 U/L — ABNORMAL LOW (ref 15–41)
Albumin: 3.4 g/dL — ABNORMAL LOW (ref 3.5–5.0)
Alkaline Phosphatase: 64 U/L (ref 38–126)
Anion gap: 17 — ABNORMAL HIGH (ref 5–15)
BUN: 72 mg/dL — ABNORMAL HIGH (ref 8–23)
CO2: 23 mmol/L (ref 22–32)
Calcium: 8.9 mg/dL (ref 8.9–10.3)
Chloride: 95 mmol/L — ABNORMAL LOW (ref 98–111)
Creatinine, Ser: 13.13 mg/dL — ABNORMAL HIGH (ref 0.61–1.24)
GFR, Estimated: 4 mL/min — ABNORMAL LOW (ref >60)
Glucose, Bld: 84 mg/dL (ref 70–99)
Potassium: 5.1 mmol/L (ref 3.5–5.1)
Sodium: 135 mmol/L (ref 135–145)
Total Bilirubin: 0.4 mg/dL (ref 0.3–1.2)
Total Protein: 7.4 g/dL (ref 6.5–8.1)

## 2021-08-24 LAB — APTT: aPTT: 36 seconds (ref 24–36)

## 2021-08-24 LAB — D-DIMER, QUANTITATIVE: D-Dimer, Quant: 1.78 ug/mL-FEU — ABNORMAL HIGH (ref 0.00–0.50)

## 2021-08-24 LAB — PROTIME-INR
INR: 1.3 — ABNORMAL HIGH (ref 0.8–1.2)
Prothrombin Time: 15.7 seconds — ABNORMAL HIGH (ref 11.4–15.2)

## 2021-08-24 NOTE — ED Triage Notes (Signed)
Pt presents via EMS with complaints of left leg swelling - that has been occurring over the last 5-6 days and today has been worse. He has a right leg prosthetic and on dialysis MWF and he attended yesterday. Denies CP or SOB.  ?

## 2021-08-25 ENCOUNTER — Emergency Department
Admission: EM | Admit: 2021-08-25 | Discharge: 2021-08-25 | Disposition: A | Discharge status: Home / Self Care | Payer: Medicare Other | Attending: Emergency Medicine | Admitting: Emergency Medicine

## 2021-08-25 DIAGNOSIS — G8918 Other acute postprocedural pain: Secondary | ICD-10-CM

## 2021-08-25 DIAGNOSIS — S301XXA Contusion of abdominal wall, initial encounter: Secondary | ICD-10-CM | POA: Diagnosis not present

## 2021-08-25 DIAGNOSIS — M79605 Pain in left leg: Secondary | ICD-10-CM

## 2021-08-25 MED ORDER — DOCUSATE SODIUM 100 MG PO CAPS: 100.0000 mg | ORAL_CAPSULE | Freq: Two times a day (BID) | ORAL | 0 refills | Status: DC

## 2021-08-25 MED ORDER — ONDANSETRON 4 MG PO TBDP: 4.0000 mg | ORAL_TABLET | Freq: Four times a day (QID) | ORAL | 0 refills | Status: DC | PRN

## 2021-08-25 MED ORDER — OXYCODONE-ACETAMINOPHEN 5-325 MG PO TABS: 2.0000 | ORAL_TABLET | Freq: Four times a day (QID) | ORAL | 0 refills | Status: DC | PRN

## 2021-08-25 MED ORDER — ONDANSETRON 4 MG PO TBDP
4.0000 mg | ORAL_TABLET | Freq: Once | ORAL | Status: AC
  Administered 2021-08-25: 4 mg via ORAL
  Filled 2021-08-25: qty 1

## 2021-08-25 MED ORDER — HYDROMORPHONE HCL 1 MG/ML IJ SOLN
0.5000 mg | Freq: Once | INTRAMUSCULAR | Status: AC
  Administered 2021-08-25: 0.5 mg via INTRAMUSCULAR
  Filled 2021-08-25: qty 1

## 2021-08-25 MED ORDER — OXYCODONE-ACETAMINOPHEN 5-325 MG PO TABS
1.0000 | ORAL_TABLET | Freq: Once | ORAL | Status: AC
  Administered 2021-08-25: 1 via ORAL
  Filled 2021-08-25: qty 1

## 2021-08-25 NOTE — ED Notes (Signed)
Pt informed that dialysis center could get him in at 56 today since he missed appt this AM per provider request.  ?

## 2021-08-25 NOTE — ED Notes (Signed)
Patient is sleeping, chest rise and fall observed. Vitals are stable. Call bell within reach. No acute distress noted. Awaiting transport home. ?

## 2021-08-25 NOTE — ED Provider Notes (Signed)
? ?[facility] ?Provider Note ? ? ? Event Date/Time  ? First MD Initiated Contact with Patient 08/25/21 0111   ?  (approximate) ? ? ?History  ? ?Leg Swelling ? ? ?HPI ? ?Johnny Tuckey. is a 71 y.o. male with history of hypertension, hyperlipidemia, peripheral vascular disease, right BKA, end-stage renal disease on hemodialysis Monday, Wednesday and Friday, CHF who presents to the emergency department complaints of burning severe pain and swelling throughout the left lower extremity.  Patient just underwent angioplasty, thrombectomy and stent placement to arteries in the left lower extremity on 08/20/2021 with Dr. Lucky Cowboy.  States he was not discharged with pain medication.  He has been wearing compression stockings but states he started having so much pain that he had to remove them.  States he is unable to ambulate due to the pain.  No redness or warmth.  No fevers or chills.  No chest pain or shortness of breath.  Has not been keeping his leg elevated. ? ? ?History provided by patient. ? ?08/20/21: ? ?Pre-operative Diagnosis: PAD with rest pain left lower extremity, acute on chronic disease ?  ?Post-operative diagnosis:  Same ?  ?Procedure(s) Performed: ?            1.  Ultrasound guidance for vascular access right femoral artery ?            2.  Catheter placement into left common femoral artery from right femoral approach ?            3.  Aortogram and selective left lower extremity angiogram ?            4.  Percutaneous transluminal angioplasty of left posterior tibial artery with 2.5 mm diameter by 30 cm length angioplasty balloon and of the proximal posterior tibial artery and tibioperoneal trunk with 3 mm diameter by 10 cm length angioplasty balloon ?            5.  Mechanical thrombectomy to the left SFA, popliteal artery, and tibioperoneal trunk with the [place] device ?            6.  Percutaneous transluminal angioplasty of the left popliteal artery with 5 mm diameter by 6 cm length  Lutonix drug-coated angioplasty balloon ?            7.  Viabahn stent placement x3 to the left SFA and popliteal arteries with a 6 mm diameter by 5 cm length, 6 mm diameter by 7.5 cm length, and a 6 mm diameter by 25 cm length Viabahn stent for residual stenosis and thrombus after above procedures ?            8.  StarClose closure device right femoral artery ? ?Past Medical History:  ?Diagnosis Date  ? Anemia   ? Anxiety   ? CHF (congestive heart failure) ([place])   ? Chronic kidney disease   ? esrd dialysis m/w/f  ? Dialysis patient [facility])   ? M, W, F  ? Gout   ? Hyperlipidemia   ? Hypertension   ? Myocardial infarction [facility]) 2010  ? 10 years ago  ? Neuromuscular disorder ([place]) 2020  ? 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  ? Peripheral vascular disease ([place])   ? S/P BKA (below knee amputation), right ([place])   ? Wears dentures   ? full upper and lower  ? ? ?Past Surgical History:  ?Procedure Laterality Date  ? A/V FISTULAGRAM Right 09/06/2018  ? Procedure: A/V FISTULAGRAM;  Surgeon: Algernon Huxley, MD;  Location: [place] [facility];  Service: Cardiovascular;  Laterality: Right;  ? A/V SHUNTOGRAM Left 06/21/2017  ? Procedure: A/V SHUNTOGRAM;  Surgeon: Katha Cabal, MD;  Location: [place] [facility];  Service: Cardiovascular;  Laterality: Left;  ? A/V SHUNTOGRAM N/A 10/24/2018  ? Procedure: A/V SHUNTOGRAM;  Surgeon: Algernon Huxley, MD;  Location: [place] [facility];  Service: Cardiovascular;  Laterality: N/A;  ? ABOVE KNEE LEG AMPUTATION Right 2020  ? AMPUTATION Right 10/25/2018  ? Procedure: AMPUTATION ABOVE KNEE;  Surgeon: Algernon Huxley, MD;  Location: [facility];  Service: General;  Laterality: Right;  ? APPLICATION OF WOUND VAC Right 04/11/2018  ? Procedure: APPLICATION OF WOUND VAC;  Surgeon: Algernon Huxley, MD;  Location: [facility];  Service: Vascular;  Laterality: Right;  ? AV FISTULA PLACEMENT Left 09/18/2015  ? Procedure: INSERTION OF ARTERIOVENOUS (AV) GORE-TEX GRAFT ARM ( BRACH/AXILLARY GRAFT W/ INSTANT STICK  GRAFT );  Surgeon: Katha Cabal, MD;  Location: [facility];  Service: Vascular;  Laterality: Left;  ? AV FISTULA PLACEMENT Right 07/19/2018  ? Procedure: INSERTION OF ARTERIOVENOUS (AV) GORE-TEX GRAFT ARM ( BRACHIAL AXILLARY);  Surgeon: Algernon Huxley, MD;  Location: [facility];  Service: Vascular;  Laterality: Right;  ? CATARACT EXTRACTION W/PHACO Left 07/01/2021  ? Procedure: CATARACT EXTRACTION PHACO AND INTRAOCULAR LENS PLACEMENT (IOC) LEFT 7.92 00:47.3;  Surgeon: Eulogio Bear, MD;  Location: [facility];  Service: Ophthalmology;  Laterality: Left;  ? DIALYSIS FISTULA CREATION Right 10/2017  ? right chest perm cath  ? DIALYSIS/PERMA CATHETER INSERTION N/A 06/09/2020  ? Procedure: DIALYSIS/PERMA CATHETER INSERTION;  Surgeon: Algernon Huxley, MD;  Location: [place] [facility];  Service: Cardiovascular;  Laterality: N/A;  ? DIALYSIS/PERMA CATHETER INSERTION N/A 07/30/2020  ? Procedure: DIALYSIS/PERMA CATHETER EXCHANGE;  Surgeon: Algernon Huxley, MD;  Location: [place] [facility];  Service: Cardiovascular;  Laterality: N/A;  ? DIALYSIS/PERMA CATHETER REMOVAL N/A 09/13/2018  ? Procedure: DIALYSIS/PERMA CATHETER REMOVAL;  Surgeon: Algernon Huxley, MD;  Location: [place] [facility];  Service: Cardiovascular;  Laterality: N/A;  ? ESOPHAGOGASTRODUODENOSCOPY N/A 12/19/2017  ? Procedure: ESOPHAGOGASTRODUODENOSCOPY (EGD);  Surgeon: Lin Landsman, MD;  Location: [facility] ENDOSCOPY;  Service: Gastroenterology;  Laterality: N/A;  ? LOWER EXTREMITY ANGIOGRAPHY Left 11/16/2017  ? Procedure: LOWER EXTREMITY ANGIOGRAPHY;  Surgeon: Algernon Huxley, MD;  Location: [place] [facility];  Service: Cardiovascular;  Laterality: Left;  ? LOWER EXTREMITY ANGIOGRAPHY Right 01/18/2018  ? Procedure: LOWER EXTREMITY ANGIOGRAPHY;  Surgeon: Algernon Huxley, MD;  Location: [place] [facility];  Service: Cardiovascular;  Laterality: Right;  ? LOWER EXTREMITY ANGIOGRAPHY Left 04/02/2018  ? Procedure: LOWER EXTREMITY ANGIOGRAPHY;  Surgeon: Algernon Huxley, MD;   Location: [place] [facility];  Service: Cardiovascular;  Laterality: Left;  ? LOWER EXTREMITY ANGIOGRAPHY Right 04/09/2018  ? Procedure: Lower Extremity Angiography with possible intervention;  Surgeon: Algernon Huxley, MD;  Location: [place] [facility];  Service: Cardiovascular;  Laterality: Right;  ? LOWER EXTREMITY ANGIOGRAPHY Right 07/23/2018  ? Procedure: Lower Extremity Angiography;  Surgeon: Algernon Huxley, MD;  Location: [place] [facility];  Service: Cardiovascular;  Laterality: Right;  ? LOWER EXTREMITY ANGIOGRAPHY Right 09/13/2018  ? Procedure: LOWER EXTREMITY ANGIOGRAPHY;  Surgeon: Algernon Huxley, MD;  Location: [place] [facility];  Service: Cardiovascular;  Laterality: Right;  ? LOWER EXTREMITY ANGIOGRAPHY Left 08/20/2021  ? Procedure: Lower Extremity Angiography;  Surgeon: Algernon Huxley, MD;  Location: [facility]  INV[facility];  Service: Cardiovascular;  Laterality: Left;  ? LOWER EXTREMITY VENOGRAPHY Right 09/13/2018  ? Procedure: LOWER EXTREMITY VENOGRAPHY;  Surgeon: Algernon Huxley, MD;  Location: [place] [facility];  Service: Cardiovascular;  Laterality: Right;  ? PERIPHERAL VASCULAR CATHETERIZATION Left 09/01/2015  ? Procedure: A/V Shuntogram/Fistulagram;  Surgeon: Katha Cabal, MD;  Location: [place] [facility];  Service: Cardiovascular;  Laterality: Left;  ? PERIPHERAL VASCULAR CATHETERIZATION N/A 09/30/2015  ? Procedure: A/V Shuntogram/Fistulagram with perm cathether removal;  Surgeon: Algernon Huxley, MD;  Location: [place] [facility];  Service: Cardiovascular;  Laterality: N/A;  ? PERIPHERAL VASCULAR CATHETERIZATION Left 09/30/2015  ? Procedure: A/V Shunt Intervention;  Surgeon: Algernon Huxley, MD;  Location: [place] [facility];  Service: Cardiovascular;  Laterality: Left;  ? PERIPHERAL VASCULAR CATHETERIZATION Left 12/03/2015  ? Procedure: Thrombectomy;  Surgeon: Algernon Huxley, MD;  Location: [place] [facility];  Service: Cardiovascular;  Laterality: Left;  ? PERIPHERAL VASCULAR CATHETERIZATION Left  01/28/2016  ? Procedure: Thrombectomy;  Surgeon: Algernon Huxley, MD;  Location: [place] [facility];  Service: Cardiovascular;  Laterality: Left;  ? PERIPHERAL VASCULAR CATHETERIZATION N/A 01/28/2016  ? Procedure:

## 2021-08-25 NOTE — Discharge Instructions (Addendum)
I recommend that you keep a compression stocking on your left leg at all times except when bathing.  This will help with pain and swelling.  Please keep this leg elevated above the level of your heart and try to rest as much as possible over the next few days with the leg elevated which will help with swelling and pain.  You had strong pulses in your leg and no sign of an arterial or venous obstruction/clot.  There is no sign of any infection today but if you develop redness, warmth in the leg or have fever of 100.4 or higher, please return to the emergency department.  I recommend you continue dialysis as scheduled.  If you begin having chest pain or shortness of breath, please return to the emergency department.  We are discharging with a prescription of pain medication that you may take as needed.  Please follow-up closely with vascular surgery as scheduled.  If your leg becomes cold, blue, numb or you have pain that is uncontrolled at home, please return to the ER. ? ? ?You are being provided a prescription for opiates (also known as narcotics) for pain control.  Opiates can be addictive and should only be used when absolutely necessary for pain control when other alternatives do not work.  We recommend you only use them for the recommended amount of time and only as prescribed.  Please do not take with other sedative medications or alcohol.  Please do not drive, operate machinery, make important decisions while taking opiates.  Please note that these medications can be addictive and have high abuse potential.  Patients can become addicted to narcotics after only taking them for a few days.  Please keep these medications locked away from children, teenagers or any family members with history of substance abuse.  Narcotic pain medicine may also make you constipated.  You may use over-the-counter medications such as MiraLAX, Colace to prevent constipation.  If you become constipated, you may use over-the-counter  enemas as needed.  Itching and nausea are also common side effects of narcotic pain medication.  If you develop uncontrolled vomiting or a rash, please stop these medications and seek medical care. ? ?

## 2021-08-25 NOTE — ED Notes (Signed)
O2/2L nasal cannula applied due to desaturation after pain medication. O2 sats maintaining at94-96% on 2L ?

## 2021-08-25 NOTE — ED Notes (Signed)
Left leg elevated with a pillow. ?

## 2021-09-04 ENCOUNTER — Inpatient Hospital Stay
Admission: EM | Admit: 2021-09-04 | Discharge: 2021-09-08 | DRG: 602 | Disposition: A | Discharge status: Skilled Nursing Facility | Payer: Medicare Other | Attending: Internal Medicine | Admitting: Internal Medicine

## 2021-09-04 ENCOUNTER — Other Ambulatory Visit: Payer: Self-pay

## 2021-09-04 ENCOUNTER — Emergency Department: Payer: Medicare Other

## 2021-09-04 DIAGNOSIS — I252 Old myocardial infarction: Secondary | ICD-10-CM

## 2021-09-04 DIAGNOSIS — E785 Hyperlipidemia, unspecified: Secondary | ICD-10-CM | POA: Diagnosis present

## 2021-09-04 DIAGNOSIS — L8990 Pressure ulcer of unspecified site, unspecified stage: Secondary | ICD-10-CM | POA: Diagnosis present

## 2021-09-04 DIAGNOSIS — Z87891 Personal history of nicotine dependence: Secondary | ICD-10-CM

## 2021-09-04 DIAGNOSIS — D631 Anemia in chronic kidney disease: Secondary | ICD-10-CM | POA: Diagnosis present

## 2021-09-04 DIAGNOSIS — I5032 Chronic diastolic (congestive) heart failure: Secondary | ICD-10-CM | POA: Diagnosis present

## 2021-09-04 DIAGNOSIS — I739 Peripheral vascular disease, unspecified: Secondary | ICD-10-CM | POA: Diagnosis present

## 2021-09-04 DIAGNOSIS — Z7982 Long term (current) use of aspirin: Secondary | ICD-10-CM | POA: Diagnosis not present

## 2021-09-04 DIAGNOSIS — Z89611 Acquired absence of right leg above knee: Secondary | ICD-10-CM | POA: Diagnosis not present

## 2021-09-04 DIAGNOSIS — E1122 Type 2 diabetes mellitus with diabetic chronic kidney disease: Secondary | ICD-10-CM | POA: Diagnosis present

## 2021-09-04 DIAGNOSIS — L89623 Pressure ulcer of left heel, stage 3: Secondary | ICD-10-CM | POA: Diagnosis present

## 2021-09-04 DIAGNOSIS — Z79899 Other long term (current) drug therapy: Secondary | ICD-10-CM

## 2021-09-04 DIAGNOSIS — N2581 Secondary hyperparathyroidism of renal origin: Secondary | ICD-10-CM | POA: Diagnosis present

## 2021-09-04 DIAGNOSIS — Z7901 Long term (current) use of anticoagulants: Secondary | ICD-10-CM

## 2021-09-04 DIAGNOSIS — S78111A Complete traumatic amputation at level between right hip and knee, initial encounter: Secondary | ICD-10-CM

## 2021-09-04 DIAGNOSIS — E1151 Type 2 diabetes mellitus with diabetic peripheral angiopathy without gangrene: Secondary | ICD-10-CM | POA: Diagnosis present

## 2021-09-04 DIAGNOSIS — I10 Essential (primary) hypertension: Secondary | ICD-10-CM | POA: Diagnosis present

## 2021-09-04 DIAGNOSIS — L02416 Cutaneous abscess of left lower limb: Secondary | ICD-10-CM | POA: Diagnosis present

## 2021-09-04 DIAGNOSIS — I132 Hypertensive heart and chronic kidney disease with heart failure and with stage 5 chronic kidney disease, or end stage renal disease: Secondary | ICD-10-CM | POA: Diagnosis present

## 2021-09-04 DIAGNOSIS — M109 Gout, unspecified: Secondary | ICD-10-CM | POA: Diagnosis present

## 2021-09-04 DIAGNOSIS — L89000 Pressure ulcer of unspecified elbow, unstageable: Secondary | ICD-10-CM | POA: Diagnosis not present

## 2021-09-04 DIAGNOSIS — E440 Moderate protein-calorie malnutrition: Secondary | ICD-10-CM | POA: Diagnosis present

## 2021-09-04 DIAGNOSIS — L89622 Pressure ulcer of left heel, stage 2: Secondary | ICD-10-CM | POA: Diagnosis present

## 2021-09-04 DIAGNOSIS — N186 End stage renal disease: Secondary | ICD-10-CM | POA: Diagnosis present

## 2021-09-04 DIAGNOSIS — Z992 Dependence on renal dialysis: Secondary | ICD-10-CM

## 2021-09-04 DIAGNOSIS — Z833 Family history of diabetes mellitus: Secondary | ICD-10-CM | POA: Diagnosis not present

## 2021-09-04 DIAGNOSIS — L03116 Cellulitis of left lower limb: Secondary | ICD-10-CM | POA: Diagnosis present

## 2021-09-04 DIAGNOSIS — Z6821 Body mass index (BMI) 21.0-21.9, adult: Secondary | ICD-10-CM

## 2021-09-04 DIAGNOSIS — F411 Generalized anxiety disorder: Secondary | ICD-10-CM | POA: Diagnosis present

## 2021-09-04 DIAGNOSIS — E11649 Type 2 diabetes mellitus with hypoglycemia without coma: Secondary | ICD-10-CM | POA: Diagnosis not present

## 2021-09-04 DIAGNOSIS — L97529 Non-pressure chronic ulcer of other part of left foot with unspecified severity: Secondary | ICD-10-CM | POA: Diagnosis present

## 2021-09-04 LAB — CBC WITH DIFFERENTIAL/PLATELET
Abs Immature Granulocytes: 0.13 10*3/uL — ABNORMAL HIGH (ref 0.00–0.07)
Basophils Absolute: 0.1 10*3/uL (ref 0.0–0.1)
Basophils Relative: 1 %
Eosinophils Absolute: 0.3 10*3/uL (ref 0.0–0.5)
Eosinophils Relative: 2 %
HCT: 36.6 % — ABNORMAL LOW (ref 39.0–52.0)
Hemoglobin: 10.6 g/dL — ABNORMAL LOW (ref 13.0–17.0)
Immature Granulocytes: 1 %
Lymphocytes Relative: 13 %
Lymphs Abs: 1.8 10*3/uL (ref 0.7–4.0)
MCH: 29.1 pg (ref 26.0–34.0)
MCHC: 29 g/dL — ABNORMAL LOW (ref 30.0–36.0)
MCV: 100.5 fL — ABNORMAL HIGH (ref 80.0–100.0)
Monocytes Absolute: 1.2 10*3/uL — ABNORMAL HIGH (ref 0.1–1.0)
Monocytes Relative: 8 %
Neutro Abs: 11.1 10*3/uL — ABNORMAL HIGH (ref 1.7–7.7)
Neutrophils Relative %: 75 %
Platelets: 249 10*3/uL (ref 150–400)
RBC: 3.64 MIL/uL — ABNORMAL LOW (ref 4.22–5.81)
RDW: 16 % — ABNORMAL HIGH (ref 11.5–15.5)
WBC: 14.7 10*3/uL — ABNORMAL HIGH (ref 4.0–10.5)
nRBC: 0 % (ref 0.0–0.2)

## 2021-09-04 LAB — COMPREHENSIVE METABOLIC PANEL
ALT: 5 U/L (ref 0–44)
AST: 16 U/L (ref 15–41)
Albumin: 3.5 g/dL (ref 3.5–5.0)
Alkaline Phosphatase: 63 U/L (ref 38–126)
Anion gap: 10 (ref 5–15)
BUN: 19 mg/dL (ref 8–23)
CO2: 27 mmol/L (ref 22–32)
Calcium: 8.4 mg/dL — ABNORMAL LOW (ref 8.9–10.3)
Chloride: 102 mmol/L (ref 98–111)
Creatinine, Ser: 8.11 mg/dL — ABNORMAL HIGH (ref 0.61–1.24)
GFR, Estimated: 6 mL/min — ABNORMAL LOW (ref >60)
Glucose, Bld: 85 mg/dL (ref 70–99)
Potassium: 3.8 mmol/L (ref 3.5–5.1)
Sodium: 139 mmol/L (ref 135–145)
Total Bilirubin: 0.6 mg/dL (ref 0.3–1.2)
Total Protein: 8.1 g/dL (ref 6.5–8.1)

## 2021-09-04 LAB — LACTIC ACID, PLASMA
Lactic Acid, Venous: 1.3 mmol/L (ref 0.5–1.9)
Lactic Acid, Venous: 1.7 mmol/L (ref 0.5–1.9)

## 2021-09-04 LAB — PROCALCITONIN: Procalcitonin: 1.39 ng/mL

## 2021-09-04 MED ORDER — HYDROMORPHONE HCL 1 MG/ML IJ SOLN
1.0000 mg | Freq: Once | INTRAMUSCULAR | Status: AC
  Administered 2021-09-04: 1 mg via INTRAVENOUS
  Filled 2021-09-04: qty 1

## 2021-09-04 MED ORDER — SODIUM CHLORIDE 0.9 % IV BOLUS
500.0000 mL | Freq: Once | INTRAVENOUS | Status: AC
Start: 2021-09-04 — End: 2021-09-04
  Administered 2021-09-04: 500 mL via INTRAVENOUS

## 2021-09-04 MED ORDER — GABAPENTIN 100 MG PO CAPS
200.0000 mg | ORAL_CAPSULE | Freq: Two times a day (BID) | ORAL | Status: DC
  Administered 2021-09-04: 200 mg via ORAL
  Filled 2021-09-04 (×2): qty 2

## 2021-09-04 MED ORDER — ACETAMINOPHEN 650 MG RE SUPP: 650.0000 mg | Freq: Four times a day (QID) | RECTAL | Status: DC | PRN

## 2021-09-04 MED ORDER — LACTATED RINGERS IV SOLN: INTRAVENOUS | Status: DC

## 2021-09-04 MED ORDER — SEVELAMER CARBONATE 800 MG PO TABS
2400.0000 mg | ORAL_TABLET | Freq: Two times a day (BID) | ORAL | Status: DC
Start: 2021-09-05 — End: 2021-09-07
  Administered 2021-09-05 – 2021-09-07 (×5): 2400 mg via ORAL
  Filled 2021-09-04 (×5): qty 3

## 2021-09-04 MED ORDER — ASPIRIN EC 81 MG PO TBEC
81.0000 mg | DELAYED_RELEASE_TABLET | Freq: Every day | ORAL | Status: DC
  Administered 2021-09-05 – 2021-09-07 (×3): 81 mg via ORAL
  Filled 2021-09-04 (×4): qty 1

## 2021-09-04 MED ORDER — BISACODYL 5 MG PO TBEC
5.0000 mg | DELAYED_RELEASE_TABLET | Freq: Every day | ORAL | Status: DC | PRN
  Administered 2021-09-07: 5 mg via ORAL
  Filled 2021-09-04: qty 1

## 2021-09-04 MED ORDER — APIXABAN 5 MG PO TABS
5.0000 mg | ORAL_TABLET | Freq: Two times a day (BID) | ORAL | Status: DC
Start: 2021-09-04 — End: 2021-09-08
  Administered 2021-09-04 – 2021-09-07 (×7): 5 mg via ORAL
  Filled 2021-09-04 (×7): qty 1

## 2021-09-04 MED ORDER — SODIUM CHLORIDE 0.9 % IV SOLN
1.0000 g | Freq: Once | INTRAVENOUS | Status: AC
  Administered 2021-09-04: 1 g via INTRAVENOUS
  Filled 2021-09-04: qty 10

## 2021-09-04 MED ORDER — CLONAZEPAM 0.5 MG PO TABS
0.5000 mg | ORAL_TABLET | Freq: Two times a day (BID) | ORAL | Status: DC
  Administered 2021-09-04: 0.5 mg via ORAL
  Filled 2021-09-04: qty 1

## 2021-09-04 MED ORDER — SODIUM CHLORIDE 0.9 % IV SOLN
1.0000 g | INTRAVENOUS | Status: DC
  Administered 2021-09-04: 1 g via INTRAVENOUS
  Filled 2021-09-04 (×2): qty 10

## 2021-09-04 MED ORDER — POLYETHYLENE GLYCOL 3350 17 G PO PACK: 17.0000 g | PACK | Freq: Every day | ORAL | Status: DC | PRN

## 2021-09-04 MED ORDER — VANCOMYCIN HCL 750 MG/150ML IV SOLN: 750.0000 mg | INTRAVENOUS | Status: DC

## 2021-09-04 MED ORDER — ACETAMINOPHEN 325 MG PO TABS
650.0000 mg | ORAL_TABLET | Freq: Four times a day (QID) | ORAL | Status: DC | PRN
  Administered 2021-09-05 – 2021-09-08 (×4): 650 mg via ORAL
  Filled 2021-09-04 (×3): qty 2

## 2021-09-04 MED ORDER — ALLOPURINOL 100 MG PO TABS
100.0000 mg | ORAL_TABLET | Freq: Every evening | ORAL | Status: DC
  Administered 2021-09-04 – 2021-09-07 (×4): 100 mg via ORAL
  Filled 2021-09-04 (×4): qty 1

## 2021-09-04 MED ORDER — OXYCODONE-ACETAMINOPHEN 5-325 MG PO TABS
1.0000 | ORAL_TABLET | Freq: Four times a day (QID) | ORAL | Status: DC | PRN
  Administered 2021-09-04 – 2021-09-08 (×12): 1 via ORAL
  Filled 2021-09-04 (×12): qty 1

## 2021-09-04 MED ORDER — DOCUSATE SODIUM 100 MG PO CAPS
100.0000 mg | ORAL_CAPSULE | Freq: Two times a day (BID) | ORAL | Status: DC
  Administered 2021-09-04 – 2021-09-07 (×6): 100 mg via ORAL
  Filled 2021-09-04 (×8): qty 1

## 2021-09-04 MED ORDER — ATORVASTATIN CALCIUM 10 MG PO TABS
10.0000 mg | ORAL_TABLET | Freq: Every day | ORAL | Status: DC
  Administered 2021-09-04 – 2021-09-07 (×4): 10 mg via ORAL
  Filled 2021-09-04 (×4): qty 1

## 2021-09-04 MED ORDER — VANCOMYCIN HCL 1750 MG/350ML IV SOLN
1750.0000 mg | Freq: Once | INTRAVENOUS | Status: AC
  Administered 2021-09-04: 1750 mg via INTRAVENOUS
  Filled 2021-09-04: qty 350

## 2021-09-04 NOTE — Progress Notes (Signed)
Elink following for sepsis protocol. 
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
## 2021-09-04 NOTE — ED Triage Notes (Signed)
Pt in via EMS from an independent living facility via EMS with c/o weakness for the last day. Pt gets dialysis Mon, Wed and Fri. Pt had his dialysis yesterday and all went well. Pt also reports some nausea. 120/65, FSBS 122, Temp 97.8 oral, HR 90's.  ?

## 2021-09-04 NOTE — H&P (Signed)
? ? ?History and Physical:  ? ? ?Johnny [name].  ? ?WGN:[id number] DOB: 03-18-1951 DOA: 09/04/2021 ? ?Referring MD/provider: Vladimir Crofts ?PCP: [name], MD  ? ?Patient coming from: Home ? ?Chief Complaint: Pain and swelling in left leg ? ?History of Present Illness:  ? ?Johnny [name]. is an 71 y.o. male with PVD s/p R AKA who recently underwent revascularization of LLE s/p critical limb ischemia 2 weeks ago was improving until last week when he hit his left heel.  He notes that he subsequently noted redness around his heel and this was followed by pain which he describes as sharp and shooting and subsequently by swelling and increased redness.  Patient notes he came in today primarily because of pain.  Notes he has had "slight fevers" at home although is not sure how high.  No chills or sweats.  Has been tolerating dialysis without difficulty.  Was dialyzed yesterday. ? ?ED Course: Patient was noted to have red swollen leg.  Imaging revealed no evidence of OM on plain films. The patient was treated with ceftriaxone ? ?ROS:  ? ?ROS  ? ?Review of Systems: ?As per HPI ? ?Past Medical History:  ? ?Past Medical History:  ?Diagnosis Date  ? Anemia   ? Anxiety   ? CHF (congestive heart failure) ([place])   ? Chronic kidney disease   ? esrd dialysis m/w/f  ? Dialysis patient [facility])   ? M, W, F  ? Gout   ? Hyperlipidemia   ? Hypertension   ? Myocardial infarction [facility]) 2010  ? 10 years ago  ? Neuromuscular disorder ([place]) 2020  ? 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  ? Peripheral vascular disease ([place])   ? S/P BKA (below knee amputation), right ([place])   ? Wears dentures   ? full upper and lower  ? ? ?Past Surgical History:  ? ?Past Surgical History:  ?Procedure Laterality Date  ? A/V FISTULAGRAM Right 09/06/2018  ? Procedure: A/V FISTULAGRAM;  Surgeon: Algernon Huxley, MD;  Location: [place] [facility];  Service: Cardiovascular;  Laterality: Right;  ? A/V SHUNTOGRAM Left 06/21/2017  ? Procedure: A/V SHUNTOGRAM;  Surgeon:  Katha Cabal, MD;  Location: [place] [facility];  Service: Cardiovascular;  Laterality: Left;  ? A/V SHUNTOGRAM N/A 10/24/2018  ? Procedure: A/V SHUNTOGRAM;  Surgeon: Algernon Huxley, MD;  Location: [place] [facility];  Service: Cardiovascular;  Laterality: N/A;  ? ABOVE KNEE LEG AMPUTATION Right 2020  ? AMPUTATION Right 10/25/2018  ? Procedure: AMPUTATION ABOVE KNEE;  Surgeon: Algernon Huxley, MD;  Location: [facility];  Service: General;  Laterality: Right;  ? APPLICATION OF WOUND VAC Right 04/11/2018  ? Procedure: APPLICATION OF WOUND VAC;  Surgeon: Algernon Huxley, MD;  Location: [facility];  Service: Vascular;  Laterality: Right;  ? AV FISTULA PLACEMENT Left 09/18/2015  ? Procedure: INSERTION OF ARTERIOVENOUS (AV) GORE-TEX GRAFT ARM ( BRACH/AXILLARY GRAFT W/ INSTANT STICK GRAFT );  Surgeon: Katha Cabal, MD;  Location: [facility];  Service: Vascular;  Laterality: Left;  ? AV FISTULA PLACEMENT Right 07/19/2018  ? Procedure: INSERTION OF ARTERIOVENOUS (AV) GORE-TEX GRAFT ARM ( BRACHIAL AXILLARY);  Surgeon: Algernon Huxley, MD;  Location: [facility];  Service: Vascular;  Laterality: Right;  ? CATARACT EXTRACTION W/PHACO Left 07/01/2021  ? Procedure: CATARACT EXTRACTION PHACO AND INTRAOCULAR LENS PLACEMENT (IOC) LEFT 7.92 00:47.3;  Surgeon: Eulogio Bear, MD;  Location: [facility];  Service: Ophthalmology;  Laterality: Left;  ? DIALYSIS  FISTULA CREATION Right 10/2017  ? right chest perm cath  ? DIALYSIS/PERMA CATHETER INSERTION N/A 06/09/2020  ? Procedure: DIALYSIS/PERMA CATHETER INSERTION;  Surgeon: Algernon Huxley, MD;  Location: [place] [facility];  Service: Cardiovascular;  Laterality: N/A;  ? DIALYSIS/PERMA CATHETER INSERTION N/A 07/30/2020  ? Procedure: DIALYSIS/PERMA CATHETER EXCHANGE;  Surgeon: Algernon Huxley, MD;  Location: [place] [facility];  Service: Cardiovascular;  Laterality: N/A;  ? DIALYSIS/PERMA CATHETER REMOVAL N/A 09/13/2018  ? Procedure: DIALYSIS/PERMA CATHETER REMOVAL;  Surgeon: Algernon Huxley, MD;  Location:  [place] [facility];  Service: Cardiovascular;  Laterality: N/A;  ? ESOPHAGOGASTRODUODENOSCOPY N/A 12/19/2017  ? Procedure: ESOPHAGOGASTRODUODENOSCOPY (EGD);  Surgeon: Lin Landsman, MD;  Location: [facility] ENDOSCOPY;  Service: Gastroenterology;  Laterality: N/A;  ? LOWER EXTREMITY ANGIOGRAPHY Left 11/16/2017  ? Procedure: LOWER EXTREMITY ANGIOGRAPHY;  Surgeon: Algernon Huxley, MD;  Location: [place] [facility];  Service: Cardiovascular;  Laterality: Left;  ? LOWER EXTREMITY ANGIOGRAPHY Right 01/18/2018  ? Procedure: LOWER EXTREMITY ANGIOGRAPHY;  Surgeon: Algernon Huxley, MD;  Location: [place] [facility];  Service: Cardiovascular;  Laterality: Right;  ? LOWER EXTREMITY ANGIOGRAPHY Left 04/02/2018  ? Procedure: LOWER EXTREMITY ANGIOGRAPHY;  Surgeon: Algernon Huxley, MD;  Location: [place] [facility];  Service: Cardiovascular;  Laterality: Left;  ? LOWER EXTREMITY ANGIOGRAPHY Right 04/09/2018  ? Procedure: Lower Extremity Angiography with possible intervention;  Surgeon: Algernon Huxley, MD;  Location: [place] [facility];  Service: Cardiovascular;  Laterality: Right;  ? LOWER EXTREMITY ANGIOGRAPHY Right 07/23/2018  ? Procedure: Lower Extremity Angiography;  Surgeon: Algernon Huxley, MD;  Location: [place] [facility];  Service: Cardiovascular;  Laterality: Right;  ? LOWER EXTREMITY ANGIOGRAPHY Right 09/13/2018  ? Procedure: LOWER EXTREMITY ANGIOGRAPHY;  Surgeon: Algernon Huxley, MD;  Location: [place] [facility];  Service: Cardiovascular;  Laterality: Right;  ? LOWER EXTREMITY ANGIOGRAPHY Left 08/20/2021  ? Procedure: Lower Extremity Angiography;  Surgeon: Algernon Huxley, MD;  Location: [place] [facility];  Service: Cardiovascular;  Laterality: Left;  ? LOWER EXTREMITY VENOGRAPHY Right 09/13/2018  ? Procedure: LOWER EXTREMITY VENOGRAPHY;  Surgeon: Algernon Huxley, MD;  Location: [place] [facility];  Service: Cardiovascular;  Laterality: Right;  ? PERIPHERAL VASCULAR CATHETERIZATION Left 09/01/2015  ? Procedure: A/V  Shuntogram/Fistulagram;  Surgeon: Katha Cabal, MD;  Location: [place] [facility];  Service: Cardiovascular;  Laterality: Left;  ? PERIPHERAL VASCULAR CATHETERIZATION N/A 09/30/2015  ? Procedure: A/V Shuntogram/Fistulagram with perm cathether removal;  Surgeon: Algernon Huxley, MD;  Location: [place] [facility];  Service: Cardiovascular;  Laterality: N/A;  ? PERIPHERAL VASCULAR CATHETERIZATION Left 09/30/2015  ? Procedure: A/V Shunt Intervention;  Surgeon: Algernon Huxley, MD;  Location: [place] [facility];  Service: Cardiovascular;  Laterality: Left;  ? PERIPHERAL VASCULAR CATHETERIZATION Left 12/03/2015  ? Procedure: Thrombectomy;  Surgeon: Algernon Huxley, MD;  Location: [place] [facility];  Service: Cardiovascular;  Laterality: Left;  ? PERIPHERAL VASCULAR CATHETERIZATION Left 01/28/2016  ? Procedure: Thrombectomy;  Surgeon: Algernon Huxley, MD;  Location: [place] [facility];  Service: Cardiovascular;  Laterality: Left;  ? PERIPHERAL VASCULAR CATHETERIZATION N/A 01/28/2016  ? Procedure: A/V Shuntogram/Fistulagram;  Surgeon: Algernon Huxley, MD;  Location: [place] [facility];  Service: Cardiovascular;  Laterality: N/A;  ? SKIN SPLIT GRAFT Right 05/24/2018  ? Procedure: SKIN GRAFT SPLIT THICKNESS ( RIGHT CALF);  Surgeon: Algernon Huxley, MD;  Location: [facility];  Service: Vascular;  Laterality: Right;  ?  UPPER EXTREMITY ANGIOGRAPHY  10/24/2018  ? Procedure: Upper Extremity Angiography;  Surgeon: Algernon Huxley, MD;  Location: [place] [facility];  Service: Cardiovascular;;  ? UPPER EXTREMITY ANGIOGRAPHY Right 02/14/2019  ? Procedure: UPPER EXTREMITY ANGIOGRAPHY;  Surgeon: Algernon Huxley, MD;  Location: [place] [facility];  Service: Cardiovascular;  Laterality: Right;  ? UPPER EXTREMITY ANGIOGRAPHY Right 04/20/2020  ? Procedure: UPPER EXTREMITY ANGIOGRAPHY;  Surgeon: Algernon Huxley, MD;  Location: [place] [facility];  Service: Cardiovascular;  Laterality: Right;  ? WOUND DEBRIDEMENT Right 04/11/2018  ? Procedure: DEBRIDEMENT WOUND  calf muscle and skin;  Surgeon: Algernon Huxley, MD;  Location: [facility];  Service: Vascular;  Laterality: Right;  ? ? ?Social History:  ? ?Social History  ? ?Socioeconomic History  ? Marital status: Single  ?  Spouse

## 2021-09-04 NOTE — ED Triage Notes (Signed)
Pt states that he is having pain in swelling in his L leg- pt states he has an appointment with Dr Lucky Cowboy next week but could not wait- pt states it happened this morning when he rolled off the bed ?

## 2021-09-04 NOTE — Consult Note (Signed)
Pharmacy Antibiotic Note ? ?Johnny [name]. is a 71 y.o. male admitted on 09/04/2021 with cellulitis.  Pharmacy has been consulted for Vancomycin and cefepime dosing. Patient with PMH of ESRD on HD MWF with last HD session Friday 09/04/27.  ? ?Plan: ?Cefepime 1 gram Q24H ?Vancomycin 1750 mg LD x 1 follwed by ?Vancomycin 750 mg QHD ?Goal Vancomycin level 15-25 mcg/mL ?Adjust schedule if inpatient HD schedule changes ? ?Height: 5\' 8"  (172.7 cm) ?Weight: 76.7 kg (169 lb) ?IBW/kg (Calculated) : 68.4 ? ?Temp (24hrs), Avg:98.1 ?F (36.7 ?C), Min:98.1 ?F (36.7 ?C), Max:98.1 ?F (36.7 ?C) ? ?Recent Labs  ?Lab 09/04/21 ?1018 09/04/21 ?1156  ?WBC 14.7*  --   ?CREATININE 8.11*  --   ?LATICACIDVEN  --  1.3  ?  ?Estimated Creatinine Clearance: 8 mL/min (A) (by C-G formula based on SCr of 8.11 mg/dL (H)).   ? ?Allergies  ?Allergen Reactions  ? Shellfish Allergy Anaphylaxis  ? Other   ? ? ?Antimicrobials this admission: ?4/29 ceftriaxone x 1  ?4/29 vancomycin >>  ?4/29 cefepeime >> ? ?Dose adjustments this admission: ? ? ?Microbiology results: ?4/29 BCx: sent ? ? ?Thank you for allowing pharmacy to be a part of this patient?s care. ? ?Dorothe Pea, PharmD, BCPS ?Clinical Pharmacist   ?09/04/2021 2:24 PM ? ?

## 2021-09-04 NOTE — Consult Note (Signed)
Referring Provider: No ref. provider found ?Primary Care Physician:  [name], MD ?Primary Nephrologist:  Dr. Jamse Arn ? ?Reason for Consultation:  ESRD ? ?HPI: 71 year old male with history of hypertension, coronary artery disease, congestive heart failure, severe peripheral arterial disease, s/p above-the-knee amputation on the right side, end-stage renal disease on hemodialysis now comes to the emergency room with worsening swelling of the left lower extremity, fever and nonhealing ulcers.  He has been on Monday Wednesday Friday schedule for dialysis.  He had his dialysis yesterday. ? ?Past Medical History:  ?Diagnosis Date  ? Anemia   ? Anxiety   ? CHF (congestive heart failure) ([place])   ? Chronic kidney disease   ? esrd dialysis m/w/f  ? Dialysis patient [facility])   ? M, W, F  ? Gout   ? Hyperlipidemia   ? Hypertension   ? Myocardial infarction [facility]) 2010  ? 10 years ago  ? Neuromuscular disorder ([place]) 2020  ? 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  ? Peripheral vascular disease ([place])   ? S/P BKA (below knee amputation), right ([place])   ? Wears dentures   ? full upper and lower  ? ? ?Past Surgical History:  ?Procedure Laterality Date  ? A/V FISTULAGRAM Right 09/06/2018  ? Procedure: A/V FISTULAGRAM;  Surgeon: Algernon Huxley, MD;  Location: [name] [facility];  Service: Cardiovascular;  Laterality: Right;  ? A/V SHUNTOGRAM Left 06/21/2017  ? Procedure: A/V SHUNTOGRAM;  Surgeon: Katha Cabal, MD;  Location: [place] [facility];  Service: Cardiovascular;  Laterality: Left;  ? A/V SHUNTOGRAM N/A 10/24/2018  ? Procedure: A/V SHUNTOGRAM;  Surgeon: Algernon Huxley, MD;  Location: [place] [facility];  Service: Cardiovascular;  Laterality: N/A;  ? ABOVE KNEE LEG AMPUTATION Right 2020  ? AMPUTATION Right 10/25/2018  ? Procedure: AMPUTATION ABOVE KNEE;  Surgeon: Algernon Huxley, MD;  Location: [facility];  Service: General;  Laterality: Right;  ? APPLICATION OF WOUND VAC Right 04/11/2018  ? Procedure: APPLICATION OF WOUND  VAC;  Surgeon: Algernon Huxley, MD;  Location: [facility];  Service: Vascular;  Laterality: Right;  ? AV FISTULA PLACEMENT Left 09/18/2015  ? Procedure: INSERTION OF ARTERIOVENOUS (AV) GORE-TEX GRAFT ARM ( BRACH/AXILLARY GRAFT W/ INSTANT STICK GRAFT );  Surgeon: Katha Cabal, MD;  Location: [facility];  Service: Vascular;  Laterality: Left;  ? AV FISTULA PLACEMENT Right 07/19/2018  ? Procedure: INSERTION OF ARTERIOVENOUS (AV) GORE-TEX GRAFT ARM ( BRACHIAL AXILLARY);  Surgeon: Algernon Huxley, MD;  Location: [facility];  Service: Vascular;  Laterality: Right;  ? CATARACT EXTRACTION W/PHACO Left 07/01/2021  ? Procedure: CATARACT EXTRACTION PHACO AND INTRAOCULAR LENS PLACEMENT (IOC) LEFT 7.92 00:47.3;  Surgeon: Eulogio Bear, MD;  Location: [facility];  Service: Ophthalmology;  Laterality: Left;  ? DIALYSIS FISTULA CREATION Right 10/2017  ? right chest perm cath  ? DIALYSIS/PERMA CATHETER INSERTION N/A 06/09/2020  ? Procedure: DIALYSIS/PERMA CATHETER INSERTION;  Surgeon: Algernon Huxley, MD;  Location: [place] [facility];  Service: Cardiovascular;  Laterality: N/A;  ? DIALYSIS/PERMA CATHETER INSERTION N/A 07/30/2020  ? Procedure: DIALYSIS/PERMA CATHETER EXCHANGE;  Surgeon: Algernon Huxley, MD;  Location: [place] [facility];  Service: Cardiovascular;  Laterality: N/A;  ? DIALYSIS/PERMA CATHETER REMOVAL N/A 09/13/2018  ? Procedure: DIALYSIS/PERMA CATHETER REMOVAL;  Surgeon: Algernon Huxley, MD;  Location: [place] [facility];  Service: Cardiovascular;  Laterality: N/A;  ? ESOPHAGOGASTRODUODENOSCOPY N/A 12/19/2017  ? Procedure: ESOPHAGOGASTRODUODENOSCOPY (EGD);  Surgeon: Lin Landsman, MD;  Location: [facility]OS[facility];  Service: Gastroenterology;  Laterality: N/A;  ? LOWER EXTREMITY ANGIOGRAPHY Left 11/16/2017  ? Procedure: LOWER EXTREMITY ANGIOGRAPHY;  Surgeon: Algernon Huxley, MD;  Location: [place] [facility];  Service: Cardiovascular;  Laterality: Left;  ? LOWER EXTREMITY ANGIOGRAPHY Right 01/18/2018  ? Procedure: LOWER EXTREMITY  ANGIOGRAPHY;  Surgeon: Algernon Huxley, MD;  Location: [place] [facility];  Service: Cardiovascular;  Laterality: Right;  ? LOWER EXTREMITY ANGIOGRAPHY Left 04/02/2018  ? Procedure: LOWER EXTREMITY ANGIOGRAPHY;  Surgeon: Algernon Huxley, MD;  Location: [place] [facility];  Service: Cardiovascular;  Laterality: Left;  ? LOWER EXTREMITY ANGIOGRAPHY Right 04/09/2018  ? Procedure: Lower Extremity Angiography with possible intervention;  Surgeon: Algernon Huxley, MD;  Location: [place] [facility];  Service: Cardiovascular;  Laterality: Right;  ? LOWER EXTREMITY ANGIOGRAPHY Right 07/23/2018  ? Procedure: Lower Extremity Angiography;  Surgeon: Algernon Huxley, MD;  Location: [place] [facility];  Service: Cardiovascular;  Laterality: Right;  ? LOWER EXTREMITY ANGIOGRAPHY Right 09/13/2018  ? Procedure: LOWER EXTREMITY ANGIOGRAPHY;  Surgeon: Algernon Huxley, MD;  Location: [place] [facility];  Service: Cardiovascular;  Laterality: Right;  ? LOWER EXTREMITY ANGIOGRAPHY Left 08/20/2021  ? Procedure: Lower Extremity Angiography;  Surgeon: Algernon Huxley, MD;  Location: [place] [facility];  Service: Cardiovascular;  Laterality: Left;  ? LOWER EXTREMITY VENOGRAPHY Right 09/13/2018  ? Procedure: LOWER EXTREMITY VENOGRAPHY;  Surgeon: Algernon Huxley, MD;  Location: [place] [facility];  Service: Cardiovascular;  Laterality: Right;  ? PERIPHERAL VASCULAR CATHETERIZATION Left 09/01/2015  ? Procedure: A/V Shuntogram/Fistulagram;  Surgeon: Katha Cabal, MD;  Location: [place] [facility];  Service: Cardiovascular;  Laterality: Left;  ? PERIPHERAL VASCULAR CATHETERIZATION N/A 09/30/2015  ? Procedure: A/V Shuntogram/Fistulagram with perm cathether removal;  Surgeon: Algernon Huxley, MD;  Location: [place] [facility];  Service: Cardiovascular;  Laterality: N/A;  ? PERIPHERAL VASCULAR CATHETERIZATION Left 09/30/2015  ? Procedure: A/V Shunt Intervention;  Surgeon: Algernon Huxley, MD;  Location: [place] [facility];  Service: Cardiovascular;  Laterality:  Left;  ? PERIPHERAL VASCULAR CATHETERIZATION Left 12/03/2015  ? Procedure: Thrombectomy;  Surgeon: Algernon Huxley, MD;  Location: [place] [facility];  Service: Cardiovascular;  Laterality: Left;  ? PERIPHERAL VASCULAR CATHETERIZATION Left 01/28/2016  ? Procedure: Thrombectomy;  Surgeon: Algernon Huxley, MD;  Location: [place] [facility];  Service: Cardiovascular;  Laterality: Left;  ? PERIPHERAL VASCULAR CATHETERIZATION N/A 01/28/2016  ? Procedure: A/V Shuntogram/Fistulagram;  Surgeon: Algernon Huxley, MD;  Location: [place] [facility];  Service: Cardiovascular;  Laterality: N/A;  ? SKIN SPLIT GRAFT Right 05/24/2018  ? Procedure: SKIN GRAFT SPLIT THICKNESS ( RIGHT CALF);  Surgeon: Algernon Huxley, MD;  Location: [facility];  Service: Vascular;  Laterality: Right;  ? UPPER EXTREMITY ANGIOGRAPHY  10/24/2018  ? Procedure: Upper Extremity Angiography;  Surgeon: Algernon Huxley, MD;  Location: [place] [facility];  Service: Cardiovascular;;  ? UPPER EXTREMITY ANGIOGRAPHY Right 02/14/2019  ? Procedure: UPPER EXTREMITY ANGIOGRAPHY;  Surgeon: Algernon Huxley, MD;  Location: [place] [facility];  Service: Cardiovascular;  Laterality: Right;  ? UPPER EXTREMITY ANGIOGRAPHY Right 04/20/2020  ? Procedure: UPPER EXTREMITY ANGIOGRAPHY;  Surgeon: Algernon Huxley, MD;  Location: [place] [facility];  Service: Cardiovascular;  Laterality: Right;  ? WOUND DEBRIDEMENT Right 04/11/2018  ? Procedure: DEBRIDEMENT WOUND calf muscle and skin;  Surgeon: Algernon Huxley, MD;  Location: [facility];  Service: Vascular;  Laterality: Right;  ? ? ?  Prior to Admission medications   ?Medication Sig Start Date End Date Taking? Authorizing Provider  ?allopurinol (ZYLOPRIM) 100 MG tablet Take 100 mg by mouth every evening.   Yes [provider]  ?apixaban (ELIQUIS) 5 MG TABS tablet Take 1 tablet (5 mg total) by mouth 2 (two) times daily. 08/21/21  Yes Dwyane Dee, MD  ?aspirin EC 81 MG tablet Take 1 tablet (81 mg total) by mouth daily. 04/20/20  Yes Dew, Erskine Squibb, MD   ?atorvastatin (LIPITOR) 10 MG tablet Take 10 mg by mouth at bedtime. 11/23/17  Yes [provider]  ?carvedilol (COREG) 3.125 MG tablet Take 3.125 mg by mouth 2 (two) times daily. 05/24/21  Yes Provider

## 2021-09-04 NOTE — ED Notes (Signed)
Attempted to obtain bloodwork, no success ?

## 2021-09-04 NOTE — Progress Notes (Signed)
Patient arrived to the floor at [place]. Alert and oriented x 4. Complains of no discomfort. Skin tear found on the left heel of the foot and well as the left calf, wounds open to air, will be putting in a wound consult. Medications were not verified or given by ER staff that was scheduled at 1400 hour. Will continue to monitor patient for any changes in his cognition. Bed at its lowest position, falls alarm on.  ?

## 2021-09-04 NOTE — ED Notes (Addendum)
[name] phone number [phone] power of attorney given an update. ?

## 2021-09-05 DIAGNOSIS — L02416 Cutaneous abscess of left lower limb: Secondary | ICD-10-CM | POA: Diagnosis not present

## 2021-09-05 DIAGNOSIS — L03116 Cellulitis of left lower limb: Secondary | ICD-10-CM | POA: Diagnosis not present

## 2021-09-05 LAB — CBC
HCT: 36.3 % — ABNORMAL LOW (ref 39.0–52.0)
HCT: 34.7 % — ABNORMAL LOW (ref 39.0–52.0)
Hemoglobin: 10.4 g/dL — ABNORMAL LOW (ref 13.0–17.0)
Hemoglobin: 10.2 g/dL — ABNORMAL LOW (ref 13.0–17.0)
MCH: 29 pg (ref 26.0–34.0)
MCH: 29.1 pg (ref 26.0–34.0)
MCHC: 28.7 g/dL — ABNORMAL LOW (ref 30.0–36.0)
MCHC: 29.4 g/dL — ABNORMAL LOW (ref 30.0–36.0)
MCV: 101.1 fL — ABNORMAL HIGH (ref 80.0–100.0)
MCV: 99.1 fL (ref 80.0–100.0)
Platelets: 210 10*3/uL (ref 150–400)
Platelets: 206 10*3/uL (ref 150–400)
RBC: 3.59 MIL/uL — ABNORMAL LOW (ref 4.22–5.81)
RBC: 3.5 MIL/uL — ABNORMAL LOW (ref 4.22–5.81)
RDW: 15.9 % — ABNORMAL HIGH (ref 11.5–15.5)
RDW: 15.9 % — ABNORMAL HIGH (ref 11.5–15.5)
WBC: 11.1 10*3/uL — ABNORMAL HIGH (ref 4.0–10.5)
WBC: 11.4 10*3/uL — ABNORMAL HIGH (ref 4.0–10.5)
nRBC: 0 % (ref 0.0–0.2)
nRBC: 0 % (ref 0.0–0.2)

## 2021-09-05 LAB — RENAL FUNCTION PANEL
Albumin: 3.1 g/dL — ABNORMAL LOW (ref 3.5–5.0)
Anion gap: 12 (ref 5–15)
BUN: 26 mg/dL — ABNORMAL HIGH (ref 8–23)
CO2: 24 mmol/L (ref 22–32)
Calcium: 8.1 mg/dL — ABNORMAL LOW (ref 8.9–10.3)
Chloride: 104 mmol/L (ref 98–111)
Creatinine, Ser: 9.61 mg/dL — ABNORMAL HIGH (ref 0.61–1.24)
GFR, Estimated: 5 mL/min — ABNORMAL LOW (ref >60)
Glucose, Bld: 83 mg/dL (ref 70–99)
Phosphorus: 5 mg/dL — ABNORMAL HIGH (ref 2.5–4.6)
Potassium: 4.1 mmol/L (ref 3.5–5.1)
Sodium: 140 mmol/L (ref 135–145)

## 2021-09-05 LAB — BASIC METABOLIC PANEL
Anion gap: 14 (ref 5–15)
BUN: 22 mg/dL (ref 8–23)
CO2: 22 mmol/L (ref 22–32)
Calcium: 8.1 mg/dL — ABNORMAL LOW (ref 8.9–10.3)
Chloride: 106 mmol/L (ref 98–111)
Creatinine, Ser: 8.64 mg/dL — ABNORMAL HIGH (ref 0.61–1.24)
GFR, Estimated: 6 mL/min — ABNORMAL LOW (ref >60)
Glucose, Bld: 65 mg/dL — ABNORMAL LOW (ref 70–99)
Potassium: 4.6 mmol/L (ref 3.5–5.1)
Sodium: 142 mmol/L (ref 135–145)

## 2021-09-05 LAB — HEPATITIS B SURFACE ANTIGEN: Hepatitis B Surface Ag: NONREACTIVE

## 2021-09-05 LAB — GLUCOSE, CAPILLARY
Glucose-Capillary: 68 mg/dL — ABNORMAL LOW (ref 70–99)
Glucose-Capillary: 86 mg/dL (ref 70–99)

## 2021-09-05 LAB — PROCALCITONIN: Procalcitonin: 1.16 ng/mL

## 2021-09-05 MED ORDER — SODIUM CHLORIDE 0.9 % IV SOLN: 100.0000 mL | INTRAVENOUS | Status: DC | PRN

## 2021-09-05 MED ORDER — SODIUM CHLORIDE 0.9 % IV SOLN
1.0000 g | INTRAVENOUS | Status: DC
  Filled 2021-09-05: qty 10

## 2021-09-05 MED ORDER — ALTEPLASE 2 MG IJ SOLR: 2.0000 mg | Freq: Once | INTRAMUSCULAR | Status: DC | PRN

## 2021-09-05 MED ORDER — GABAPENTIN 100 MG PO CAPS
100.0000 mg | ORAL_CAPSULE | ORAL | Status: DC
  Administered 2021-09-06: 100 mg via ORAL
  Filled 2021-09-05: qty 1

## 2021-09-05 MED ORDER — CHLORHEXIDINE GLUCONATE CLOTH 2 % EX PADS: 6.0000 | MEDICATED_PAD | Freq: Every day | CUTANEOUS | Status: DC

## 2021-09-05 MED ORDER — CHLORHEXIDINE GLUCONATE CLOTH 2 % EX PADS
6.0000 | MEDICATED_PAD | Freq: Every day | CUTANEOUS | Status: DC
  Administered 2021-09-06 – 2021-09-07 (×2): 6 via TOPICAL

## 2021-09-05 MED ORDER — LIDOCAINE HCL (PF) 1 % IJ SOLN
5.0000 mL | INTRAMUSCULAR | Status: DC | PRN
  Filled 2021-09-05: qty 5

## 2021-09-05 MED ORDER — CLONAZEPAM 0.25 MG PO TBDP
0.5000 mg | ORAL_TABLET | Freq: Two times a day (BID) | ORAL | Status: DC
  Administered 2021-09-05 – 2021-09-07 (×6): 0.5 mg via ORAL
  Filled 2021-09-05 (×6): qty 2

## 2021-09-05 MED ORDER — LIDOCAINE-PRILOCAINE 2.5-2.5 % EX CREA: 1.0000 "application " | TOPICAL_CREAM | CUTANEOUS | Status: DC | PRN

## 2021-09-05 MED ORDER — PENTAFLUOROPROP-TETRAFLUOROETH EX AERO: 1.0000 "application " | INHALATION_SPRAY | CUTANEOUS | Status: DC | PRN

## 2021-09-05 MED ORDER — GABAPENTIN 100 MG PO CAPS
100.0000 mg | ORAL_CAPSULE | Freq: Two times a day (BID) | ORAL | Status: DC
  Administered 2021-09-05 – 2021-09-07 (×6): 100 mg via ORAL
  Filled 2021-09-05 (×6): qty 1

## 2021-09-05 MED ORDER — HEPARIN SODIUM (PORCINE) 1000 UNIT/ML DIALYSIS
1000.0000 [IU] | INTRAMUSCULAR | Status: DC | PRN
  Administered 2021-09-06: 3600 [IU] via INTRAVENOUS_CENTRAL

## 2021-09-05 MED ORDER — SODIUM CHLORIDE 0.9 % IV SOLN: 1.0000 g | INTRAVENOUS | Status: DC

## 2021-09-05 MED ORDER — OXYCODONE HCL 5 MG PO TABS
5.0000 mg | ORAL_TABLET | Freq: Four times a day (QID) | ORAL | Status: DC | PRN
  Administered 2021-09-05: 5 mg via ORAL
  Filled 2021-09-05: qty 1

## 2021-09-05 NOTE — Progress Notes (Signed)
Patient morning lab draw glucose was 65. Checked patient's CBG with glucometer, current result is 68. Gave patient a ginger ale and breakfast is on the way. Patient stated he did not eat last night and appetite has been poor. Patient states he is not diabetic. Alerted Dr. Si Raider.  ? ?Fuller Mandril, RN ? ?

## 2021-09-05 NOTE — Plan of Care (Signed)
  Problem: Nutrition: Goal: Adequate nutrition will be maintained Outcome: Progressing   Problem: Pain Managment: Goal: General experience of comfort will improve Outcome: Progressing   

## 2021-09-05 NOTE — Progress Notes (Signed)
Patient unable to tolerate prevalon boot. Per patient, the boot against the covered wound was still very painful. Bedside RN elevated left leg with pillows/float heal.  ? ?Fuller Mandril, RN ? ?
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
## 2021-09-05 NOTE — Evaluation (Signed)
Occupational Therapy Evaluation ?Patient Details ?Name: Johnny [name]. ?MRN: [id number] ?DOB: 1951-03-23 ?Today's Date: 09/05/2021 ? ? ?History of Present Illness Johnny [name]. is an 71 y.o. male with PVD s/p R AKA who recently underwent revascularization of LLE s/p critical limb ischemia 2 weeks ago was improving until last week when he hit his left heel.  He notes that he subsequently noted redness around his heel and this was followed by pain which he describes as sharp and shooting and subsequently by swelling and increased redness.  He notes he came to the ER primarily because of pain.  ? ?Clinical Impression ?  ?Johnny [name] presents with generalized weakness, limited endurance, impaired balance, and a L heel wound. He describes his LLE as "sore" but not as painful as it has been in previous few days. Pain increases with movement. Pt lives alone in an apartment, has intermittent assistance from friends. Discussed with pt the possibility of his moving into a facility where he could receive more regular assistance, but he stated that he was not interested in such at this time. During today's evaluation, pt was quite limited in his mobility, unable to achieve full sitting posture at edge of bed and laboring to make all movements. He needed 4-5 minutes to scoot himself < 1 ft towards head of bed, moving inch by inch and taking frequent rest breaks. Pt required Max A for donning/doffing protective boot; he was unable to tolerate boot for more than a few minutes. Pt required Max A for elevating foot on pillows in bed. Explained importance of proper wound management. Pt verbalized understanding, explaining that he had only a small sore on his calf that became quickly infected, leading to his R AKA. Pt left in bed, alarm activated, call bell within reach. Will continue OT services throughout hospitalization, with [place] post DC.  ?   ? ?Recommendations for follow up therapy are one component of a multi-disciplinary  discharge planning process, led by the attending physician.  Recommendations may be updated based on patient status, additional functional criteria and insurance authorization.  ? ?Follow Up Recommendations ? Home health OT  ?  ?Assistance Recommended at Discharge Intermittent Supervision/Assistance  ?Patient can return home with the following A little help with walking and/or transfers;A little help with bathing/dressing/bathroom;Assistance with cooking/housework;Help with stairs or ramp for entrance ? ?  ?Functional Status Assessment ? Patient has had a recent decline in their functional status and demonstrates the ability to make significant improvements in function in a reasonable and predictable amount of time.  ?Equipment Recommendations ? None recommended by OT  ?  ?Recommendations for Other Services   ? ? ?  ?Precautions / Restrictions Precautions ?Precautions: Fall ?Restrictions ?Weight Bearing Restrictions: Yes ?Other Position/Activity Restrictions: Prevalon boot as tolerated. Elevate LUE.  ? ?  ? ?Mobility Bed Mobility ?Overal bed mobility: Needs Assistance ?Bed Mobility: Supine to Sit, Sit to Supine ?  ?  ?Supine to sit: Mod assist ?Sit to supine: Mod assist ?  ?General bed mobility comments: able to scoot towards [facility] with MinA, cueing, greatly increased time ?  ? ?Transfers ?  ?  ?  ?  ?  ?  ?  ?  ?  ?General transfer comment: not attempted ?  ? ?  ?Balance Overall balance assessment: Needs assistance ?Sitting-balance support: Bilateral upper extremity supported ?Sitting balance-Leahy Scale: Poor ?  ?Postural control: Posterior lean, Right lateral lean ?  ?Standing balance-Leahy Scale: Zero ?  ?  ?  ?  ?  ?  ?  ?  ?  ?  ?  ?  ?   ? ?  ADL either performed or assessed with clinical judgement  ? ?ADL Overall ADL's : Needs assistance/impaired ?  ?  ?  ?  ?  ?  ?  ?  ?  ?  ?Lower Body Dressing: Maximal assistance ?Lower Body Dressing Details (indicate cue type and reason): Max A for donning/doffing boot ?  ?   ?  ?  ?  ?  ?Functional mobility during ADLs: Moderate assistance ?   ? ? ? ?Vision   ?   ?   ?Perception   ?  ?Praxis   ?  ? ?Pertinent Vitals/Pain Pain Assessment ?Pain Assessment: 0-10 ?Pain Location: L heel ?Pain Descriptors / Indicators: Dull, Tender ?Pain Intervention(s): Repositioned, Limited activity within patient's tolerance  ? ? ? ?Hand Dominance Right ?  ?Extremity/Trunk Assessment Upper Extremity Assessment ?Upper Extremity Assessment: Overall WFL for tasks assessed ?  ?Lower Extremity Assessment ?Lower Extremity Assessment: LLE deficits/detail;RLE deficits/detail ?RLE Deficits / Details: AKA amputation ?LLE Deficits / Details: pain increases w/ movement ?  ?  ?  ?Communication Communication ?Communication: No difficulties ?  ?Cognition Arousal/Alertness: Awake/alert ?Behavior During Therapy: [facility] for tasks assessed/performed ?Overall Cognitive Status: Within Functional Limits for tasks assessed ?  ?  ?  ?  ?  ?  ?  ?  ?  ?  ?  ?  ?  ?  ?  ?  ?General Comments: very slow speech, delayed processing time ?  ?  ?General Comments    ? ?  ?Exercises Other Exercises ?Other Exercises: Educ re: foot protection, recommendations for positioning ?  ?Shoulder Instructions    ? ? ?Home Living Family/patient expects to be discharged to:: Private residence ?Living Arrangements: Alone ?Available Help at Discharge: Friend(s) ?Type of Home: Apartment ?Home Access: Elevator ?  ?  ?Home Layout: One level ?  ?  ?Bathroom Shower/Tub: Walk-in shower ?  ?Bathroom Toilet: Handicapped height ?  ?  ?Home Equipment: BSC/3in1;Shower seat - built in;Grab bars - tub/shower;Grab bars - toilet;Hand held shower head;Wheelchair - manual;Wheelchair - power ?  ?  ?  ? ?  ?Prior Functioning/Environment Prior Level of Function : Independent/Modified Independent ?  ?  ?  ?  ?  ?  ?Mobility Comments: uses WC for mobility ?ADLs Comments: has handicapped-accessible apt with roll-in shower, grab bars. Drives. Has friends and neighbors visiting  intermittently to assist with care needs ?  ? ?  ?  ?OT Problem List: Decreased strength;Pain;Decreased activity tolerance ?  ?   ?OT Treatment/Interventions: Self-care/ADL training;Therapeutic exercise;Patient/family education;Balance training;Energy conservation;Therapeutic activities;DME and/or AE instruction  ?  ?OT Goals(Current goals can be found in the care plan section) Acute Rehab OT Goals ?Patient Stated Goal: for his heel to heal ?OT Goal Formulation: With patient ?Time For Goal Achievement: 09/19/21 ?Potential to Achieve Goals: Good ?ADL Goals ?Pt Will Perform Lower Body Dressing: with modified independence;sitting/lateral leans (including donning/doffing any recommended foot protective devices) ?Pt Will Transfer to Toilet: with modified independence;grab bars (using LRAD) ?Additional ADL Goal #1: Pt will identify/demonstrate 2+ energy conservation strategies  ?OT Frequency: Min 2X/week ?  ? ?Co-evaluation   ?  ?  ?  ?  ? ?  ?AM-PAC OT "6 Clicks" Daily Activity     ?Outcome Measure Help from another person eating meals?: A Little ?Help from another person taking care of personal grooming?: A Little ?Help from another person toileting, which includes using toliet, bedpan, or urinal?: A Lot ?Help from another person bathing (including washing, rinsing, drying)?: A Lot ?Help from  another person to put on and taking off regular upper body clothing?: A Little ?Help from another person to put on and taking off regular lower body clothing?: A Lot ?6 Click Score: 15 ?  ?End of Session Nurse Communication: Mobility status ? ?Activity Tolerance: Patient limited by lethargy ?Patient left: in bed;with call bell/phone within reach;with bed alarm set ? ?OT Visit Diagnosis: Muscle weakness (generalized) (M62.81)  ?              ?Time: 1594-7076 ?OT Time Calculation (min): 23 min ?Charges:  OT General Charges ?$OT Visit: 1 Visit ?OT Evaluation ?$OT Eval Moderate Complexity: 1 Mod ?OT Treatments ?$Self Care/Home  Management : 8-22 mins ?Josiah Lobo, PhD, MS, OTR/L ?09/05/21, 3:58 PM ? ?

## 2021-09-05 NOTE — Consult Note (Signed)
[facility] Nurse Consult Note: ? ?Consultation completed remotely following review of the medical record, including photograph. ? ?Reason for Consult:Left heel pressure injury, full thickness, Stage 3. Photo provided by EDP yesterday appreciated. ?Wound type:pressure in the presence of recently (4/14) revascularization procedure ?Pressure Injury POA: Yes ?Measurement:To be obtained by Bedside RN today prior to placement of first dressing and entered into the Nursing Flow Sheet ?Wound bed: Red, moist  ?Drainage (amount, consistency, odor) small ?Periwound: edematous ?Dressing procedure/placement/frequency: I will place conservative care orders using a soap and water cleanse, pat dry and placement of normal saline moistened gauze dressings (opened) twice daily The dressings are to be secured with a few turns of Kerlix roll gauze and the foot placed into a pressure redistribution heel boot (Prevalon), which is also ordered. ? ?Patient was to see Dr. Lucky Cowboy in follow up this coming week, but reported stated that "he could not wait" due to increasing edema and discomfort in the LLE. He is known to be at risk for limb loss given his contralateral amputation and recent revascularization procedure.  ? ?I have communicated my topical care recommendations as well as my recommendation that Vascular Surgery be consulted this admission to Dr. Imagene Riches via [place]. ? ?[place] nursing team will not follow, but will remain available to this patient, the nursing and medical teams.   ? ?Thank you. ? ?Maudie Flakes, MSN, RN, [place], [place], CWON-AP, [place]  ?Pager# [phone]  ? ? ? ?  ?
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
## 2021-09-05 NOTE — Progress Notes (Signed)
Patient's IV occluded, would not flush. Bedside RN removed IV from left AC. IV catheter bent at the middle. Bedside RN unable to find another accessible vein for new IV. Will put in order for IV consult.  ? ?Fuller Mandril, RN ? ?

## 2021-09-05 NOTE — Progress Notes (Signed)
Spoke with [name], HCPOA. RN gave update. Per [place] patient is scheduled to see Dr. Lucky Cowboy Tuesday and Dr. Vickki Muff Thursday for the left leg wound.  ? ?Fuller Mandril, RN ? ?

## 2021-09-05 NOTE — Progress Notes (Addendum)
IV team RN, unable to find suitable IV access with and without [name]. Per IV team RN, patient is not a good candidate for midline. Per Dr. Si Raider, ok to leave IV out.  ? ?Fuller Mandril, RN ? ?

## 2021-09-05 NOTE — Consult Note (Signed)
?ORTHOPAEDIC CONSULTATION ? ?REQUESTING PHYSICIAN: Wouk, Ailene Rud, MD ? ?Chief Complaint: Left heel ulceration ? ?HPI: ?Johnny [name]. is a 71 y.o. male who complains of pain to his left heel.  Has longstanding history of severe peripheral vascular disease.  Status post right side amputation.  Underwent revascularization about 2 weeks ago by vascular surgery.  Admitted with possible cellulitis to his left lower extremity.  Complaining of some pain associated with the area.  He states there is been some redness.  History of chronic kidney disease and severe peripheral vascular disease as stated above. ? ?Past Medical History:  ?Diagnosis Date  ? Anemia   ? Anxiety   ? CHF (congestive heart failure) ([place])   ? Chronic kidney disease   ? esrd dialysis m/w/f  ? Dialysis patient [facility])   ? M, W, F  ? Gout   ? Hyperlipidemia   ? Hypertension   ? Myocardial infarction [facility]) 2010  ? 10 years ago  ? Neuromuscular disorder ([place]) 2020  ? 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  ? Peripheral vascular disease ([place])   ? S/P BKA (below knee amputation), right ([place])   ? Wears dentures   ? full upper and lower  ? ?Past Surgical History:  ?Procedure Laterality Date  ? A/V FISTULAGRAM Right 09/06/2018  ? Procedure: A/V FISTULAGRAM;  Surgeon: Algernon Huxley, MD;  Location: [place] [facility];  Service: Cardiovascular;  Laterality: Right;  ? A/V SHUNTOGRAM Left 06/21/2017  ? Procedure: A/V SHUNTOGRAM;  Surgeon: Katha Cabal, MD;  Location: [place] [facility];  Service: Cardiovascular;  Laterality: Left;  ? A/V SHUNTOGRAM N/A 10/24/2018  ? Procedure: A/V SHUNTOGRAM;  Surgeon: Algernon Huxley, MD;  Location: [place] [facility];  Service: Cardiovascular;  Laterality: N/A;  ? ABOVE KNEE LEG AMPUTATION Right 2020  ? AMPUTATION Right 10/25/2018  ? Procedure: AMPUTATION ABOVE KNEE;  Surgeon: Algernon Huxley, MD;  Location: [facility] ORS;  Service: General;  Laterality: Right;  ? APPLICATION OF WOUND VAC Right 04/11/2018  ? Procedure: APPLICATION  OF WOUND VAC;  Surgeon: Algernon Huxley, MD;  Location: [facility] ORS;  Service: Vascular;  Laterality: Right;  ? AV FISTULA PLACEMENT Left 09/18/2015  ? Procedure: INSERTION OF ARTERIOVENOUS (AV) GORE-TEX GRAFT ARM ( BRACH/AXILLARY GRAFT W/ INSTANT STICK GRAFT );  Surgeon: Katha Cabal, MD;  Location: [facility] ORS;  Service: Vascular;  Laterality: Left;  ? AV FISTULA PLACEMENT Right 07/19/2018  ? Procedure: INSERTION OF ARTERIOVENOUS (AV) GORE-TEX GRAFT ARM ( BRACHIAL AXILLARY);  Surgeon: Algernon Huxley, MD;  Location: [facility] ORS;  Service: Vascular;  Laterality: Right;  ? CATARACT EXTRACTION W/PHACO Left 07/01/2021  ? Procedure: CATARACT EXTRACTION PHACO AND INTRAOCULAR LENS PLACEMENT (IOC) LEFT 7.92 00:47.3;  Surgeon: Eulogio Bear, MD;  Location: [facility] ORS;  Service: Ophthalmology;  Laterality: Left;  ? DIALYSIS FISTULA CREATION Right 10/2017  ? right chest perm cath  ? DIALYSIS/PERMA CATHETER INSERTION N/A 06/09/2020  ? Procedure: DIALYSIS/PERMA CATHETER INSERTION;  Surgeon: Algernon Huxley, MD;  Location: [place] [facility];  Service: Cardiovascular;  Laterality: N/A;  ? DIALYSIS/PERMA CATHETER INSERTION N/A 07/30/2020  ? Procedure: DIALYSIS/PERMA CATHETER EXCHANGE;  Surgeon: Algernon Huxley, MD;  Location: [place] [facility];  Service: Cardiovascular;  Laterality: N/A;  ? DIALYSIS/PERMA CATHETER REMOVAL N/A 09/13/2018  ? Procedure: DIALYSIS/PERMA CATHETER REMOVAL;  Surgeon: Algernon Huxley, MD;  Location: [place] [facility];  Service: Cardiovascular;  Laterality: N/A;  ? ESOPHAGOGASTRODUODENOSCOPY N/A 12/19/2017  ? Procedure: ESOPHAGOGASTRODUODENOSCOPY (  EGD);  Surgeon: Lin Landsman, MD;  Location: [facility] ENDOSCOPY;  Service: Gastroenterology;  Laterality: N/A;  ? LOWER EXTREMITY ANGIOGRAPHY Left 11/16/2017  ? Procedure: LOWER EXTREMITY ANGIOGRAPHY;  Surgeon: Algernon Huxley, MD;  Location: [place] [facility];  Service: Cardiovascular;  Laterality: Left;  ? LOWER EXTREMITY ANGIOGRAPHY Right 01/18/2018  ? Procedure: LOWER  EXTREMITY ANGIOGRAPHY;  Surgeon: Algernon Huxley, MD;  Location: [place] [facility];  Service: Cardiovascular;  Laterality: Right;  ? LOWER EXTREMITY ANGIOGRAPHY Left 04/02/2018  ? Procedure: LOWER EXTREMITY ANGIOGRAPHY;  Surgeon: Algernon Huxley, MD;  Location: [place] [facility];  Service: Cardiovascular;  Laterality: Left;  ? LOWER EXTREMITY ANGIOGRAPHY Right 04/09/2018  ? Procedure: Lower Extremity Angiography with possible intervention;  Surgeon: Algernon Huxley, MD;  Location: [place] [facility];  Service: Cardiovascular;  Laterality: Right;  ? LOWER EXTREMITY ANGIOGRAPHY Right 07/23/2018  ? Procedure: Lower Extremity Angiography;  Surgeon: Algernon Huxley, MD;  Location: [place] [facility];  Service: Cardiovascular;  Laterality: Right;  ? LOWER EXTREMITY ANGIOGRAPHY Right 09/13/2018  ? Procedure: LOWER EXTREMITY ANGIOGRAPHY;  Surgeon: Algernon Huxley, MD;  Location: [place] [facility];  Service: Cardiovascular;  Laterality: Right;  ? LOWER EXTREMITY ANGIOGRAPHY Left 08/20/2021  ? Procedure: Lower Extremity Angiography;  Surgeon: Algernon Huxley, MD;  Location: [place] [facility];  Service: Cardiovascular;  Laterality: Left;  ? LOWER EXTREMITY VENOGRAPHY Right 09/13/2018  ? Procedure: LOWER EXTREMITY VENOGRAPHY;  Surgeon: Algernon Huxley, MD;  Location: [place] [facility];  Service: Cardiovascular;  Laterality: Right;  ? PERIPHERAL VASCULAR CATHETERIZATION Left 09/01/2015  ? Procedure: A/V Shuntogram/Fistulagram;  Surgeon: Katha Cabal, MD;  Location: [place] [facility];  Service: Cardiovascular;  Laterality: Left;  ? PERIPHERAL VASCULAR CATHETERIZATION N/A 09/30/2015  ? Procedure: A/V Shuntogram/Fistulagram with perm cathether removal;  Surgeon: Algernon Huxley, MD;  Location: [place] [facility];  Service: Cardiovascular;  Laterality: N/A;  ? PERIPHERAL VASCULAR CATHETERIZATION Left 09/30/2015  ? Procedure: A/V Shunt Intervention;  Surgeon: Algernon Huxley, MD;  Location: [place] [facility];  Service: Cardiovascular;   Laterality: Left;  ? PERIPHERAL VASCULAR CATHETERIZATION Left 12/03/2015  ? Procedure: Thrombectomy;  Surgeon: Algernon Huxley, MD;  Location: [place] [facility];  Service: Cardiovascular;  Laterality: Left;  ? PERIPHERAL VASCULAR CATHETERIZATION Left 01/28/2016  ? Procedure: Thrombectomy;  Surgeon: Algernon Huxley, MD;  Location: [place] [facility];  Service: Cardiovascular;  Laterality: Left;  ? PERIPHERAL VASCULAR CATHETERIZATION N/A 01/28/2016  ? Procedure: A/V Shuntogram/Fistulagram;  Surgeon: Algernon Huxley, MD;  Location: [place] [facility];  Service: Cardiovascular;  Laterality: N/A;  ? SKIN SPLIT GRAFT Right 05/24/2018  ? Procedure: SKIN GRAFT SPLIT THICKNESS ( RIGHT CALF);  Surgeon: Algernon Huxley, MD;  Location: [facility] ORS;  Service: Vascular;  Laterality: Right;  ? UPPER EXTREMITY ANGIOGRAPHY  10/24/2018  ? Procedure: Upper Extremity Angiography;  Surgeon: Algernon Huxley, MD;  Location: [place] [facility];  Service: Cardiovascular;;  ? UPPER EXTREMITY ANGIOGRAPHY Right 02/14/2019  ? Procedure: UPPER EXTREMITY ANGIOGRAPHY;  Surgeon: Algernon Huxley, MD;  Location: [place] [facility];  Service: Cardiovascular;  Laterality: Right;  ? UPPER EXTREMITY ANGIOGRAPHY Right 04/20/2020  ? Procedure: UPPER EXTREMITY ANGIOGRAPHY;  Surgeon: Algernon Huxley, MD;  Location: [place] [facility];  Service: Cardiovascular;  Laterality: Right;  ? WOUND DEBRIDEMENT Right 04/11/2018  ? Procedure: DEBRIDEMENT WOUND calf muscle and skin;  Surgeon: Algernon Huxley, MD;  Location: [facility]  ORS;  Service: Vascular;  Laterality: Right;  ? ?Social History  ? ?Socioeconomic History  ? Marital status: Single  ?  Spouse name: Not on file  ? Number of children: 0  ? Years of education: Not on file  ? Highest education level: Not on file  ?Occupational History  ? Occupation: retired  ?  Comment: security guard/ meat cutter  ?Tobacco Use  ? Smoking status: Former  ?  Types: Cigarettes  ?  Quit date: 05/17/2005  ?  Years since quitting: 16.3  ? Smokeless tobacco:  Never  ?Vaping Use  ? Vaping Use: Never used  ?Substance and Sexual Activity  ? Alcohol use: No  ? Drug use: No  ? Sexual activity: Not Currently  ?Other Topics Concern  ? Not on file  ?Social History Narrative

## 2021-09-06 ENCOUNTER — Telehealth (INDEPENDENT_AMBULATORY_CARE_PROVIDER_SITE_OTHER): Payer: Self-pay | Admitting: Vascular Surgery

## 2021-09-06 ENCOUNTER — Inpatient Hospital Stay: Payer: Medicare Other

## 2021-09-06 DIAGNOSIS — L89000 Pressure ulcer of unspecified elbow, unstageable: Secondary | ICD-10-CM | POA: Diagnosis not present

## 2021-09-06 LAB — BASIC METABOLIC PANEL
Anion gap: 15 (ref 5–15)
BUN: 33 mg/dL — ABNORMAL HIGH (ref 8–23)
CO2: 23 mmol/L (ref 22–32)
Calcium: 8.2 mg/dL — ABNORMAL LOW (ref 8.9–10.3)
Chloride: 103 mmol/L (ref 98–111)
Creatinine, Ser: 10.92 mg/dL — ABNORMAL HIGH (ref 0.61–1.24)
GFR, Estimated: 5 mL/min — ABNORMAL LOW (ref >60)
Glucose, Bld: 77 mg/dL (ref 70–99)
Potassium: 4.3 mmol/L (ref 3.5–5.1)
Sodium: 141 mmol/L (ref 135–145)

## 2021-09-06 LAB — HEPATITIS B SURFACE ANTIBODY,QUALITATIVE: Hep B S Ab: NONREACTIVE

## 2021-09-06 MED ORDER — HEPARIN SODIUM (PORCINE) 1000 UNIT/ML IJ SOLN
INTRAMUSCULAR | Status: AC
  Filled 2021-09-06: qty 10

## 2021-09-06 MED ORDER — OXYCODONE HCL 5 MG PO TABS
5.0000 mg | ORAL_TABLET | Freq: Once | ORAL | Status: AC
  Administered 2021-09-06: 5 mg via ORAL

## 2021-09-06 MED ORDER — OXYCODONE HCL 5 MG PO TABS
ORAL_TABLET | ORAL | Status: AC
Start: 2021-09-06 — End: 2021-09-06
  Filled 2021-09-06: qty 1

## 2021-09-06 NOTE — Telephone Encounter (Signed)
Patient's friend called and stated he is in the hospital and wanted to know if someone would come to see him while he is in the hospital.  Please advise. ?

## 2021-09-06 NOTE — Progress Notes (Signed)
Hemodialysis patient known at [facility] MWF 6:00am, [facility] transports. Patient stated no dialysis concerns.  ?

## 2021-09-06 NOTE — NC FL2 (Signed)
?[place] MEDICAID FL2 LEVEL OF CARE SCREENING TOOL  ?  ? ?IDENTIFICATION  ?Patient Name: ?Johnny [name]. Birthdate: [birth year] Sex: male Admission Date (Current Location): ?09/04/2021  ?[place] and [place] Number: ? [place] ?  Facility and Address:  ?[facility], [address], [place], [place] [address] ?     Provider Number: ?[id number]  ?Attending Physician Name and Address:  ?Gwynne Edinger, MD ? Relative Name and Phone Number:  ?  ?   ?Current Level of Care: ?Hospital Recommended Level of Care: ?[place] Prior Approval Number: ?  ? ?Date Approved/Denied: ?  PASRR Number: ?[id number] A ? ?Discharge Plan: ?SNF ?  ? ?Current Diagnoses: ?Patient Active Problem List  ? Diagnosis Date Noted  ? Onychomycosis 08/21/2021  ? Acute lower limb ischemia, left 08/20/2021  ? Acquired absence of other left toe(s) ([place]) 07/30/2020  ? Dyspnea 06/28/2020  ? COVID-19 virus infection 06/25/2020  ? Hypotension of hemodialysis 06/25/2020  ? Hypotension 06/25/2020  ? Cardiomyopathy ([place]) 01/22/2019  ? CHF (congestive heart failure) ([place]) 01/22/2019  ? Coronary disease 01/22/2019  ? Above knee amputation of right lower extremity ([place]) 11/13/2018  ? Leg ulcer, right, with fat layer exposed ([place]) 10/22/2018  ? Cellulitis of right leg 07/21/2018  ? Lower limb ulcer, calf, right, limited to breakdown of skin ([place]) 06/08/2018  ? End-stage renal disease ([place]) 05/09/2018  ? PVD (peripheral vascular disease) ([place]) 04/17/2018  ? Malnutrition of moderate degree 04/11/2018  ? Pressure injury of skin 04/06/2018  ? Altered mental status 04/04/2018  ? Hypothermia 04/04/2018  ? Hemodialysis graft malfunction ([place]) 03/26/2018  ? Hyperlipidemia 02/27/2018  ? Weakness of right lower extremity 01/20/2018  ? Fever   ? Periumbilical abdominal pain   ? Confusion 12/22/2017  ? Acute delirium 12/21/2017  ? Protein-calorie malnutrition, severe 12/19/2017  ? Intractable nausea and vomiting 12/18/2017  ?  Lymphedema 12/13/2017  ? Cellulitis 11/27/2017  ? Chest pain 11/19/2017  ? 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 Twitching 01/03/2017  ? Elevated troponin 10/02/2015  ? Complications, dialysis, catheter, mechanical ([place]) 10/02/2015  ? Musculoskeletal chest pain 09/28/2015  ? Chronic diastolic CHF (congestive heart failure) ([place]) 09/28/2015  ? ESRD on hemodialysis ([place]) 10/09/2012  ? Hypertension 10/09/2012  ? Gout 10/09/2012  ? ? ?Orientation RESPIRATION BLADDER Height & Weight   ?  ?Self, Situation, Place ? Normal Continent Weight: 173 lb 15.1 oz (78.9 kg) ?Height:  5' 7.99" (172.7 cm)  ?BEHAVIORAL SYMPTOMS/MOOD NEUROLOGICAL BOWEL NUTRITION STATUS  ? (None)  (None) Continent Diet (Renal with fluid restriction: 1800 mL.)  ?AMBULATORY STATUS COMMUNICATION OF NEEDS Skin   ?Extensive Assist Verbally PU Stage and Appropriate Care ?  ?PU Stage 2 Dressing:  (Non-pressure wound on left posterior leg. No dressing documented.) ?PU Stage 3 Dressing:  (Left heel. No dressing documented.) ?    ?     ?     ? ? ?Personal Care Assistance Level of Assistance  ?Bathing, Feeding, Dressing Bathing Assistance: Maximum assistance ?Feeding assistance: Limited assistance ?Dressing Assistance: Maximum assistance ?   ? ?Functional Limitations Info  ?Sight, Hearing, Speech Sight Info: Adequate ?Hearing Info: Adequate ?Speech Info: Adequate  ? ? ?SPECIAL CARE FACTORS FREQUENCY  ?PT (By licensed PT), OT (By licensed OT)   ?  ?PT Frequency: 5 x week ?OT Frequency: 5 x week ?  ?  ?  ?   ? ? ?Contractures Contractures Info: Not present  ? ? ?Additional Factors Info  ?  Code Status, Allergies Code Status Info: Full code ?Allergies Info: Shellfish ?  ?  ?  ?   ? ?Current Medications (09/06/2021):  This is the current hospital active medication list ?Current Facility-Administered Medications  ?Medication Dose Route Frequency Provider Last Rate Last Admin  ? acetaminophen (TYLENOL) tablet 650 mg  650 mg  Oral Q6H PRN Vashti Hey, MD   650 mg at 09/05/21 2154  ? Or  ? acetaminophen (TYLENOL) suppository 650 mg  650 mg Rectal Q6H PRN Vashti Hey, MD      ? allopurinol (ZYLOPRIM) tablet 100 mg  100 mg Oral QPM Bonnell Public Tublu, MD   100 mg at 09/05/21 1821  ? apixaban (ELIQUIS) tablet 5 mg  5 mg Oral BID Vashti Hey, MD   5 mg at 09/06/21 0865  ? aspirin EC tablet 81 mg  81 mg Oral Daily Bonnell Public Tublu, MD   81 mg at 09/06/21 7846  ? atorvastatin (LIPITOR) tablet 10 mg  10 mg Oral QHS Bonnell Public Tublu, MD   10 mg at 09/05/21 2154  ? bisacodyl (DULCOLAX) EC tablet 5 mg  5 mg Oral Daily PRN Vashti Hey, MD      ? Chlorhexidine Gluconate Cloth 2 % PADS 6 each  6 each Topical N6295 Lyla Son, MD   6 each at 09/06/21 0614  ? clonazePAM (KLONOPIN) disintegrating tablet 0.5 mg  0.5 mg Oral BID Benita Gutter, RPH   0.5 mg at 09/06/21 2841  ? docusate sodium (COLACE) capsule 100 mg  100 mg Oral BID Bonnell Public Tublu, MD   100 mg at 09/06/21 3244  ? gabapentin (NEURONTIN) capsule 100 mg  100 mg Oral BID Gwynne Edinger, MD   100 mg at 09/06/21 0102  ? And  ? gabapentin (NEURONTIN) capsule 100 mg  100 mg Oral Once per day on Mon Wed Fri Wouk, Ailene Rud, MD      ? oxyCODONE (Oxy IR/ROXICODONE) immediate release tablet 5 mg  5 mg Oral Q6H PRN Gwynne Edinger, MD   5 mg at 09/05/21 1635  ? oxyCODONE-acetaminophen (PERCOCET/ROXICET) 5-325 MG per tablet 1 tablet  1 tablet Oral Q6H PRN Vashti Hey, MD   1 tablet at 09/06/21 1301  ? polyethylene glycol (MIRALAX / GLYCOLAX) packet 17 g  17 g Oral Daily PRN Bonnell Public Tublu, MD      ? sevelamer carbonate (RENVELA) tablet 2,400 mg  2,400 mg Oral BID WC Bonnell Public Tublu, MD   2,400 mg at 09/06/21 7253  ? ? ? ?Discharge Medications: ?Please see discharge summary for a list of discharge medications. ? ?Relevant Imaging Results: ? ?Relevant Lab  Results: ? ? ?Additional Information ?SS#: [id number]. HD [place] MWF 6:00am. ? ?Candie Chroman, LCSW ? ? ? ? ?

## 2021-09-06 NOTE — TOC Initial Note (Signed)
Transition of Care (TOC) - Initial/Assessment Note  ? ? ?Patient Details  ?Name: Johnny [name]. ?MRN: [id number] ?Date of Birth: Jun 21, 1950 ? ?Transition of Care (TOC) CM/SW Contact:    ?Candie Chroman, LCSW ?Phone Number: ?09/06/2021, 3:15 PM ? ?Clinical Narrative:  CSW met with patient. No supports at bedside. CSW introduced role and explained that therapy recommendations would be discussed. Patient is agreeable to SNF placement. Gave CMS scores for facilities within 25 miles of his zip code. No further concerns. CSW encouraged patient to contact CSW as needed. CSW will continue to follow patient for support and facilitate discharge to SNF once medically stable.               ? ?Expected Discharge Plan: [place] ?Barriers to Discharge: Continued Medical Work up ? ? ?Patient Goals and CMS Choice ?  ?CMS Medicare.gov Compare Post Acute Care list provided to:: Patient ?  ? ?Expected Discharge Plan and Services ?Expected Discharge Plan: [place] ?  ?  ?Post Acute Care Choice: [place] ?Living arrangements for the past 2 months: Apartment ?                ?  ?  ?  ?  ?  ?  ?  ?  ?  ?  ? ?Prior Living Arrangements/Services ?Living arrangements for the past 2 months: Apartment ?Lives with:: Self ?Patient language and need for interpreter reviewed:: Yes ?Do you feel safe going back to the place where you live?: Yes      ?Need for Family Participation in Patient Care: Yes (Comment) ?Care giver support system in place?: Yes (comment) ?  ?Criminal Activity/Legal Involvement Pertinent to Current Situation/Hospitalization: No - Comment as needed ? ?Activities of Daily Living ?Home Assistive Devices/Equipment: Wheelchair ?ADL Screening (condition at time of admission) ?Patient's cognitive ability adequate to safely complete daily activities?: Yes ?Is the patient deaf or have difficulty hearing?: No ?Does the patient have difficulty seeing, even when wearing glasses/contacts?:  Yes ?Does the patient have difficulty concentrating, remembering, or making decisions?: No ?Patient able to express need for assistance with ADLs?: Yes ?Does the patient have difficulty dressing or bathing?: Yes ?Independently performs ADLs?: No ?Communication: Independent ?Does the patient have difficulty walking or climbing stairs?: Yes ?Weakness of Legs: Both ?Weakness of Arms/Hands: None ? ?Permission Sought/Granted ?Permission sought to share information with : Customer service manager ?Permission granted to share information with : Yes, Verbal Permission Granted ?   ? Permission granted to share info w AGENCY: SNF's ?   ?   ? ?Emotional Assessment ?Appearance:: Appears stated age ?Attitude/Demeanor/Rapport: Engaged, Gracious ?Affect (typically observed): Accepting, Appropriate, Calm, Pleasant ?Orientation: : Oriented to Self, Oriented to Place, Oriented to  Time, Oriented to Situation ?Alcohol / Substance Use: Not Applicable ?Psych Involvement: No (comment) ? ?Admission diagnosis:  Cellulitis of left lower extremity [L03.116] ?Cellulitis and abscess of left lower extremity [L03.116, L02.416] ?Patient Active Problem List  ? Diagnosis Date Noted  ? Onychomycosis 08/21/2021  ? Acute lower limb ischemia, left 08/20/2021  ? Acquired absence of other left toe(s) ([place]) 07/30/2020  ? Dyspnea 06/28/2020  ? COVID-19 virus infection 06/25/2020  ? Hypotension of hemodialysis 06/25/2020  ? Hypotension 06/25/2020  ? Cardiomyopathy ([place]) 01/22/2019  ? CHF (congestive heart failure) ([place]) 01/22/2019  ? Coronary disease 01/22/2019  ? Above knee amputation of right lower extremity ([place]) 11/13/2018  ? Leg ulcer, right, with fat layer exposed ([place]) 10/22/2018  ? Cellulitis of right leg 07/21/2018  ?  Lower limb ulcer, calf, right, limited to breakdown of skin ([place]) 06/08/2018  ? End-stage renal disease ([place]) 05/09/2018  ? PVD (peripheral vascular disease) ([place]) 04/17/2018  ? Malnutrition of moderate degree 04/11/2018  ?  Pressure injury of skin 04/06/2018  ? Altered mental status 04/04/2018  ? Hypothermia 04/04/2018  ? Hemodialysis graft malfunction ([place]) 03/26/2018  ? Hyperlipidemia 02/27/2018  ? Weakness of right lower extremity 01/20/2018  ? Fever   ? Periumbilical abdominal pain   ? Confusion 12/22/2017  ? Acute delirium 12/21/2017  ? Protein-calorie malnutrition, severe 12/19/2017  ? Intractable nausea and vomiting 12/18/2017  ? Lymphedema 12/13/2017  ? Cellulitis 11/27/2017  ? Chest pain 11/19/2017  ? 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 Twitching 01/03/2017  ? Elevated troponin 10/02/2015  ? Complications, dialysis, catheter, mechanical ([place]) 10/02/2015  ? Musculoskeletal chest pain 09/28/2015  ? Chronic diastolic CHF (congestive heart failure) ([place]) 09/28/2015  ? ESRD on hemodialysis ([place]) 10/09/2012  ? Hypertension 10/09/2012  ? Gout 10/09/2012  ? ?PCP:  [name], MD ?Pharmacy:   ?[place] [address] ?Phone: [phone] Fax: [phone] ? ? ? ? ?Social Determinants of Health (SDOH) Interventions ?  ? ?Readmission Risk Interventions ?   ? View : No data to display.  ?  ?  ?  ? ? ? ?

## 2021-09-06 NOTE — Telephone Encounter (Signed)
We can only come see the patient if they consult [name].  They typically will only do so if they feel there is a reason that vascular surgery intervention is necessary.  She would need to talk to his primary doctor about consulting [name]

## 2021-09-06 NOTE — Progress Notes (Signed)
PT Cancellation Note ? ?Patient Details ?Name: Torell Minder. ?MRN: [id number] ?DOB: 05-17-1950 ? ? ?Cancelled Treatment:    Reason Eval/Treat Not Completed: Other (comment). Consult received and chart reviewed. Pt currently out of room for HD, not available for therapy. Will re-attempt another time. ? ? ?Fawne Hughley ?09/06/2021, 10:10 AM ?Greggory Stallion, PT, DPT, GCS ?[phone] ? ?

## 2021-09-06 NOTE — Plan of Care (Signed)

## 2021-09-06 NOTE — Progress Notes (Addendum)
Hemodialysis Post Treatment Note: ? ?Tx date: 09/06/2021 ?Tx time: 2 hours and 18 minutes ?Access: Right CVC ?UF Removed: 1115 ml ? ?Note:  ? ?12:09 treatment terminated. HD not completed because Patient wants to terminate treatment early  due to pain on his Left foot with Pain scale of 10. Pt had received pain med at 10:18 See MAR. Patient is also Tachycardic at 117. NP shantelle was notified and agreed to terminate treatment. After treatment was terminated. Patient is asking for another pain medication, Np Shantelle was notified but she said we already gave him what we can give. Primary nurse also was made aware.  ? ? ? ? ? ? ? ? ?  ?

## 2021-09-06 NOTE — Telephone Encounter (Signed)
Spoke with Johnny [name] and let her know that if he is still hospitalized tomorrow she can call to cancel and it will not be a problem or "counted against him".  Also let her know that she could talk to the primary dr and see if they want to get a consult from our physicians.  She stated verbal understanding. ?
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
## 2021-09-06 NOTE — Progress Notes (Signed)
?PROGRESS NOTE ? ? ? ?Johnny [name].  BOF:[id number] DOB: 12-21-1950 DOA: 09/04/2021 ?PCP: [name], MD  ?Outpatient Specialists: nephrology, vascular surgery ? ? ? ?Brief Narrative:  ? ?From admission h and p ?Johnny [name]. is an 71 y.o. male with PVD s/p R AKA who recently underwent revascularization of LLE s/p critical limb ischemia 2 weeks ago was improving until last week when he hit his left heel.  He notes that he subsequently noted redness around his heel and this was followed by pain which he describes as sharp and shooting and subsequently by swelling and increased redness.  Patient notes he came in today primarily because of pain.  Notes he has had "slight fevers" at home although is not sure how high.  No chills or sweats.  Has been tolerating dialysis without difficulty.  Was dialyzed yesterday. ? ? ?Assessment & Plan: ?  ?Principal Problem: ?  Pressure injury of skin ?Active Problems: ?  PVD (peripheral vascular disease) ([place]) ?  ESRD on hemodialysis ([place]) ?  Above knee amputation of right lower extremity ([place]) ?  Hypertension ?  Chronic diastolic CHF (congestive heart failure) ([place]) ?  Malnutrition of moderate degree ? ? ?# stage 2 pressure ulcer left heel ?With initial concern for cellulitis. X-rays no signs osteo. Patient says present a few weeks. Admitted a couple weeks ago with ischemia to that limb, treated with angioplasty and stent. Foot is warm. No signs infection on my exam; podiatry likewise does not think infected ?- podiatry and wound care consulted, wound care recs are in ?- f/u blood culture, ngtd ?- will notify Dr. Lucky Cowboy patient is here, has outpt f/u with him tomorrow that will need to be canceled.  ? ?# Debility ?Hcpoa says pt unable to care for self at home, sleeping in his hoverround chair. PT advises snf ?- TOC consulted for snf ? ?# ESRD ?On mwf hemodialysis. No signs fluid overload, k wnl. Right supclavian access. ?- nephrology consulted, currently plan is continue  mwf hemodialysis. Had session today ? ?# PAD ?- cont home apixaban, aspirin, aatorvastatin ? ?# HFpEF ?Compensated ?- cont home coreg ? ?# HTN ?Here bp wnl. Home losartan recently held ?- coreg as above ?- monitor ? ?# GAD ?- home clonazepam ? ?DVT prophylaxis: home eliquis ?Code Status: full ?Family Communication:  Elisabeth Pigeon updated 5/1 ? ?Level of care: Med-Surg ?Status is: Inpatient ?Remains inpatient appropriate because: pending snf placement ? ? ? ?Consultants:  ?podiatry ? ?Procedures: ?none ? ?Antimicrobials:  ?Cefepime/vancomycin>ceftriaxone, now off ? ? ?Subjective: ?Left heel pain persists. Mild nausea.  ? ?Objective: ?Vitals:  ? 09/06/21 1200 09/06/21 1209 09/06/21 1228 09/06/21 1310  ?BP: 101/78 118/79 109/68 119/88  ?Pulse: (!) 115 (!) 114 (!) 108 (!) 109  ?Resp: (!) 24 (!) 21 16 20   ?Temp:  98.2 ?F (36.8 ?C)  98.6 ?F (37 ?C)  ?TempSrc:  Oral    ?SpO2: 99% 99% 96% 100%  ?Weight:  78.9 kg    ?Height:      ? ? ?Intake/Output Summary (Last 24 hours) at 09/06/2021 1353 ?Last data filed at 09/06/2021 1209 ?Gross per 24 hour  ?Intake 180 ml  ?Output 1115 ml  ?Net -935 ml  ? ?Filed Weights  ? 09/05/21 0843 09/06/21 0931 09/06/21 1209  ?Weight: 82.9 kg 80.7 kg 78.9 kg  ? ? ?Examination: ? ?General exam: Appears calm and comfortable  ?Respiratory system: Clear to auscultation. Respiratory effort normal. ?Cardiovascular system: S1 & S2 heard, RRR. No JVD,  murmurs, rubs, gallops or clicks. No pedal edema. ?Gastrointestinal system: Abdomen is nondistended, soft and nontender. No organomegaly or masses felt. Normal bowel sounds heard. ?Central nervous system: Alert and oriented. No focal neurological deficits. ?Extremities: right aka. Maceration around toes. Left foot is warm ?Skin: dressed stage 2 ulcer left heel ?Psychiatry: Judgement and insight appear normal. Mood & affect appropriate.  ? ? ? ?Data Reviewed: I have personally reviewed following labs and imaging studies ? ?CBC: ?Recent Labs  ?Lab 09/04/21 ?1018  09/05/21 ?0503 09/05/21 ?1325  ?WBC 14.7* 11.1* 11.4*  ?NEUTROABS 11.1*  --   --   ?HGB 10.6* 10.4* 10.2*  ?HCT 36.6* 36.3* 34.7*  ?MCV 100.5* 101.1* 99.1  ?PLT 249 210 206  ? ?Basic Metabolic Panel: ?Recent Labs  ?Lab 09/04/21 ?1018 09/05/21 ?0503 09/05/21 ?1325 09/06/21 ?2035  ?NA 139 142 140 141  ?K 3.8 4.6 4.1 4.3  ?CL 102 106 104 103  ?CO2 27 22 24 23   ?GLUCOSE 85 65* 83 77  ?BUN 19 22 26* 33*  ?CREATININE 8.11* 8.64* 9.61* 10.92*  ?CALCIUM 8.4* 8.1* 8.1* 8.2*  ?PHOS  --   --  5.0*  --   ? ?GFR: ?Estimated Creatinine Clearance: 5.9 mL/min (A) (by C-G formula based on SCr of 10.92 mg/dL (H)). ?Liver Function Tests: ?Recent Labs  ?Lab 09/04/21 ?1018 09/05/21 ?1325  ?AST 16  --   ?ALT 5  --   ?ALKPHOS 63  --   ?BILITOT 0.6  --   ?PROT 8.1  --   ?ALBUMIN 3.5 3.1*  ? ?No results for input(s): LIPASE, AMYLASE in the last 168 hours. ?No results for input(s): AMMONIA in the last 168 hours. ?Coagulation Profile: ?No results for input(s): INR, PROTIME in the last 168 hours. ?Cardiac Enzymes: ?No results for input(s): CKTOTAL, CKMB, CKMBINDEX, TROPONINI in the last 168 hours. ?BNP (last 3 results) ?No results for input(s): PROBNP in the last 8760 hours. ?HbA1C: ?No results for input(s): HGBA1C in the last 72 hours. ?CBG: ?Recent Labs  ?Lab 09/05/21 ?0852 09/05/21 ?1048  ?GLUCAP 68* 86  ? ?Lipid Profile: ?No results for input(s): CHOL, HDL, LDLCALC, TRIG, CHOLHDL, LDLDIRECT in the last 72 hours. ?Thyroid Function Tests: ?No results for input(s): TSH, T4TOTAL, FREET4, T3FREE, THYROIDAB in the last 72 hours. ?Anemia Panel: ?No results for input(s): VITAMINB12, FOLATE, FERRITIN, TIBC, IRON, RETICCTPCT in the last 72 hours. ?Urine analysis: ?No results found for: COLORURINE, APPEARANCEUR, [place], [place], [place], [facility], [place], KETONESUR, PROTEINUR, [place], NITRITE, LEUKOCYTESUR ?Sepsis Labs: ?@[email or web address] (procalcitonin:4,lacticidven:4) ? ?) ?Recent Results (from the past 240 hour(s))  ?Blood culture (single)      Status: None (Preliminary result)  ? Collection Time: 09/04/21  1:13 PM  ? Specimen: BLOOD  ?Result Value Ref Range Status  ? Specimen Description   Final  ?  BLOOD Blood Culture results may not be optimal due to an excessive volume of blood received in culture bottles  ? Special Requests   Final  ?  BOTTLES DRAWN AEROBIC AND ANAEROBIC RIGHT ANTECUBITAL  ? Culture   Final  ?  NO GROWTH 2 DAYS ?Performed at [facility], [address] [place], [place] [address] ?  ? Report Status PENDING  Incomplete  ?  ? ? ? ? ? ?Radiology Studies: ?No results found. ? ? ? ? ? ?Scheduled Meds: ? allopurinol  100 mg Oral QPM  ? apixaban  5 mg Oral BID  ? aspirin EC  81 mg Oral Daily  ? atorvastatin  10 mg Oral QHS  ?  Chlorhexidine Gluconate Cloth  6 each Topical Q0600  ? clonazepam  0.5 mg Oral BID  ? docusate sodium  100 mg Oral BID  ? gabapentin  100 mg Oral BID  ? And  ? gabapentin  100 mg Oral Once per day on Mon Wed Fri  ? sevelamer carbonate  2,400 mg Oral BID WC  ? ?Continuous Infusions: ? sodium chloride    ? sodium chloride    ? ? ? LOS: 2 days  ? ? ?Time spent: 25 min ? ? ? ?Desma Maxim, MD ?[facility] ? ? ?If 7PM-7AM, please contact night-coverage ?[email or web address] ?Password [email or web address] ?09/06/2021, 1:53 PM  ?  ? ?

## 2021-09-06 NOTE — Evaluation (Signed)
Physical Therapy Evaluation ?Patient Details ?Name: Johnny [name]. ?MRN: [id number] ?DOB: 05-Oct-1950 ?Today's Date: 09/06/2021 ? ?History of Present Illness ? Johnny [name]. is an 71 y.o. male with PVD s/p R AKA who recently underwent revascularization of LLE s/p critical limb ischemia 2 weeks ago was improving until last week when he hit his left heel.  He notes that he subsequently noted redness around his heel and this was followed by pain which he describes as sharp and shooting and subsequently by swelling and increased redness.  He notes he came to the ER primarily because of pain.  ?Clinical Impression ? Pt is a pleasant 71 year old male who was admitted for pressure injury of L heel. Pt performs bed mobility with min assist and then attempted transfers including sit<>stand with total assist; unable to fully stand or achieve buttocks clearance for SPT. Pt demonstrates deficits with strength/pain/mobility. Pt reports increased pain with L LE in dependent position. Pt needed to terminate HD early this date due to pain in L foot, continues to rate at 9/10. Is currently not at baseline level. Very pleasant and motivated to return back to PLOF. Would benefit if R LE prosthesis could be used in hospital to assist with transfers. Would benefit from skilled PT to address above deficits and promote optimal return to PLOF; recommend transition to STR upon discharge from acute hospitalization. ? ?   ? ?Recommendations for follow up therapy are one component of a multi-disciplinary discharge planning process, led by the attending physician.  Recommendations may be updated based on patient status, additional functional criteria and insurance authorization. ? ?Follow Up Recommendations Skilled nursing-short term rehab (<3 hours/day) ? ?  ?Assistance Recommended at Discharge Intermittent Supervision/Assistance  ?Patient can return home with the following ? Two people to help with walking and/or transfers;Two people to  help with bathing/dressing/bathroom;Help with stairs or ramp for entrance ? ?  ?Equipment Recommendations  (TBD)  ?Recommendations for Other Services ?    ?  ?Functional Status Assessment Patient has had a recent decline in their functional status and demonstrates the ability to make significant improvements in function in a reasonable and predictable amount of time.  ? ?  ?Precautions / Restrictions Precautions ?Precautions: Fall ?Restrictions ?Weight Bearing Restrictions: Yes  ? ?  ? ?Mobility ? Bed Mobility ?Overal bed mobility: Needs Assistance ?Bed Mobility: Supine to Sit, Sit to Supine ?  ?  ?Supine to sit: Min assist ?Sit to supine: Min assist ?  ?General bed mobility comments: able to come to sitting at EOB using bed controls and min assist from therapist. Once seated, able to maintain balance with supervision. ?  ? ?Transfers ?Overall transfer level: Needs assistance ?Equipment used: Rolling walker (2 wheels) ?Transfers: Sit to/from Stand ?Sit to Stand: Total assist, From elevated surface ?  ?  ?  ?  ?  ?General transfer comment: several attempts performed from elevated bed, however pt  unable to achieve buttock clearance. Able to perform lateral seated scoots at EOB. ?  ? ?Ambulation/Gait ?  ?  ?  ?  ?  ?  ?  ?General Gait Details: unable ? ?Stairs ?  ?  ?  ?  ?  ? ?Wheelchair Mobility ?  ? ?Modified Rankin (Stroke Patients Only) ?  ? ?  ? ?Balance Overall balance assessment: History of Falls, Needs assistance ?Sitting-balance support: Bilateral upper extremity supported ?Sitting balance-Leahy Scale: Fair ?  ?  ?  ?  ?  ?  ?  ?  ?  ?  ?  ?  ?  ?  ?  ?  ?   ? ? ? ?  Pertinent Vitals/Pain Pain Assessment ?Pain Assessment: 0-10 ?Pain Score: 9  ?Pain Location: L heel ?Pain Descriptors / Indicators: Tender, [place] ?Pain Intervention(s): Limited activity within patient's tolerance, Repositioned (elevated on 2 pillows)  ? ? ?Home Living Family/patient expects to be discharged to:: Private residence ?Living  Arrangements: Alone ?Available Help at Discharge: Friend(s) ?Type of Home: Apartment ?Home Access: Elevator ?  ?  ?  ?Home Layout: One level ?Home Equipment: BSC/3in1;Shower seat - built in;Grab bars - tub/shower;Grab bars - toilet;Hand held shower head;Wheelchair - manual;Wheelchair - Engineer, manufacturing systems (2 wheels) ?Additional Comments: reports he has R LE prosthesis at home  ?  ?Prior Function Prior Level of Function : Independent/Modified Independent ?  ?  ?  ?  ?  ?  ?Mobility Comments: uses WC for mobility; able to self propel. Able to SPT to [facility] at baseline without assistance. Reports one recent fall ?ADLs Comments: has handicapped-accessible apt with roll-in shower, grab bars. Drives. Has friends and neighbors visiting intermittently to assist with care needs ?  ? ? ?Hand Dominance  ?   ? ?  ?Extremity/Trunk Assessment  ? Upper Extremity Assessment ?Upper Extremity Assessment: Overall WFL for tasks assessed ?  ? ?Lower Extremity Assessment ?Lower Extremity Assessment: Generalized weakness (L LE grossly 3+/5; R LE grossly 3/5) ?RLE Deficits / Details: AKA amputation ?LLE Deficits / Details: pain increases w/ movement ?  ? ?   ?Communication  ? Communication: No difficulties  ?Cognition Arousal/Alertness: Awake/alert ?Behavior During Therapy: [facility] for tasks assessed/performed ?Overall Cognitive Status: Within Functional Limits for tasks assessed ?  ?  ?  ?  ?  ?  ?  ?  ?  ?  ?  ?  ?  ?  ?  ?  ?General Comments: pleasant and agreeable to therapy, fatigued s/p dialysis ?  ?  ? ?  ?General Comments   ? ?  ?Exercises Other Exercises ?Other Exercises: several attempts for standing, increased pain noted wtih non slip sock donned on L foot. increased pain with leg in dependent position  ? ?Assessment/Plan  ?  ?PT Assessment Patient needs continued PT services  ?PT Problem List Decreased strength;Decreased activity tolerance;Decreased balance;Decreased mobility;Pain ? ?   ?  ?PT Treatment Interventions Gait training;DME  instruction;Therapeutic activities;Therapeutic exercise;Balance training;Wheelchair mobility training   ? ?PT Goals (Current goals can be found in the Care Plan section)  ?Acute Rehab PT Goals ?Patient Stated Goal: to get stronger ?PT Goal Formulation: With patient ?Time For Goal Achievement: 09/20/21 ?Potential to Achieve Goals: Good ? ?  ?Frequency Min 2X/week ?  ? ? ?Co-evaluation   ?  ?  ?  ?  ? ? ?  ?AM-PAC PT "6 Clicks" Mobility  ?Outcome Measure Help needed turning from your back to your side while in a flat bed without using bedrails?: A Little ?Help needed moving from lying on your back to sitting on the side of a flat bed without using bedrails?: A Little ?Help needed moving to and from a bed to a chair (including a wheelchair)?: Total ?Help needed standing up from a chair using your arms (e.g., wheelchair or bedside chair)?: Total ?Help needed to walk in hospital room?: Total ?Help needed climbing 3-5 steps with a railing? : Total ?6 Click Score: 10 ? ?  ?End of Session Equipment Utilized During Treatment: Gait belt ?Activity Tolerance: Patient limited by pain ?Patient left: in bed;with bed alarm set ?Nurse Communication: Mobility status ?PT Visit Diagnosis: Unsteadiness on feet (R26.81);Muscle weakness (generalized) (M62.81);Difficulty in walking,  not elsewhere classified (R26.2);Pain ?Pain - Right/Left: Left ?Pain - part of body: Ankle and joints of foot ?  ? ?Time: 7014-1030 ?PT Time Calculation (min) (ACUTE ONLY): 19 min ? ? ?Charges:   PT Evaluation ?$PT Eval Low Complexity: 1 Low ?PT Treatments ?$Therapeutic Activity: 8-22 mins ?  ?   ? ? ?Greggory Stallion, PT, DPT, GCS ?[phone] ? ? ?Johnny [name] ?09/06/2021, 2:52 PM ? ?

## 2021-09-07 ENCOUNTER — Ambulatory Visit (INDEPENDENT_AMBULATORY_CARE_PROVIDER_SITE_OTHER): Payer: Medicare Other | Admitting: Vascular Surgery

## 2021-09-07 ENCOUNTER — Encounter: Payer: Self-pay | Admitting: Internal Medicine

## 2021-09-07 ENCOUNTER — Encounter (INDEPENDENT_AMBULATORY_CARE_PROVIDER_SITE_OTHER): Payer: Medicare Other

## 2021-09-07 DIAGNOSIS — L89000 Pressure ulcer of unspecified elbow, unstageable: Secondary | ICD-10-CM | POA: Diagnosis not present

## 2021-09-07 LAB — BASIC METABOLIC PANEL
Anion gap: 16 — ABNORMAL HIGH (ref 5–15)
BUN: 20 mg/dL (ref 8–23)
CO2: 24 mmol/L (ref 22–32)
Calcium: 7.8 mg/dL — ABNORMAL LOW (ref 8.9–10.3)
Chloride: 94 mmol/L — ABNORMAL LOW (ref 98–111)
Creatinine, Ser: 7.66 mg/dL — ABNORMAL HIGH (ref 0.61–1.24)
GFR, Estimated: 7 mL/min — ABNORMAL LOW (ref >60)
Glucose, Bld: 55 mg/dL — ABNORMAL LOW (ref 70–99)
Potassium: 3.7 mmol/L (ref 3.5–5.1)
Sodium: 134 mmol/L — ABNORMAL LOW (ref 135–145)

## 2021-09-07 LAB — GLUCOSE, CAPILLARY
Glucose-Capillary: 81 mg/dL (ref 70–99)
Glucose-Capillary: 79 mg/dL (ref 70–99)

## 2021-09-07 LAB — HEPATITIS B SURFACE ANTIBODY, QUANTITATIVE: Hep B S AB Quant (Post): 14 m[IU]/mL (ref >9.9)

## 2021-09-07 MED ORDER — SEVELAMER CARBONATE 800 MG PO TABS
2400.0000 mg | ORAL_TABLET | Freq: Three times a day (TID) | ORAL | Status: DC
Start: 2021-09-07 — End: 2021-09-08
  Administered 2021-09-07 – 2021-09-08 (×2): 2400 mg via ORAL
  Filled 2021-09-07 (×2): qty 3

## 2021-09-07 MED ORDER — DEXTROSE 50 % IV SOLN: 1.0000 | INTRAVENOUS | Status: DC | PRN

## 2021-09-07 NOTE — Progress Notes (Signed)
Occupational Therapy Treatment ?Patient Details ?Name: Johnny [name]. ?MRN: [id number] ?DOB: [birth year] ?Today's Date: 09/07/2021 ? ? ?History of present illness Johnny [name]. is an 71 y.o. male with PVD s/p R AKA who recently underwent revascularization of LLE s/p critical limb ischemia 2 weeks ago was improving until last week when he hit his left heel.  He notes that he subsequently noted redness around his heel and this was followed by pain which he describes as sharp and shooting and subsequently by swelling and increased redness.  He notes he came to the ER primarily because of pain. ?  ?OT comments ? Johnny [name] demonstrated considerable improvement from OT session 2 days previous. He reports only mild discomfort in his L foot, is able to tolerate wearing a protective boot, and performs bed mobility with much greater ease and speed. Pt's out of bed mobility is hampered in this setting, as he does not have his R leg prosthesis here and is not wanting to bear weight on L LE, protecting his L heel. Therefore, difficult to obtain a clear assessment of pt's fxl mobility, but anticipate that, now that heel pain is resolving, Johnny [name] would be mostly capable of meeting his own needs at home, where he has access to his prosthesis and wheelchair. This therapist would be comfortable with pt discharging home with [facility], if pt is able to obtain wound care management in that setting. Otherwise, recommend SNF.  ? ?Recommendations for follow up therapy are one component of a multi-disciplinary discharge planning process, led by the attending physician.  Recommendations may be updated based on patient status, additional functional criteria and insurance authorization. ?   ?Follow Up Recommendations ? Home health OT  ?  ?Assistance Recommended at Discharge Intermittent Supervision/Assistance  ?Patient can return home with the following ? A little help with walking and/or transfers;A little help with  bathing/dressing/bathroom;Assistance with cooking/housework;Help with stairs or ramp for entrance ?  ?Equipment Recommendations ? None recommended by OT  ?  ?Recommendations for Other Services Other (comment) (wound care management in post-discharge setting) ? ?  ?Precautions / Restrictions Precautions ?Precautions: Fall ?Restrictions ?Weight Bearing Restrictions: Yes ?Other Position/Activity Restrictions: Prevalon boot as tolerated. Elevate LUE.  ? ? ?  ? ?Mobility Bed Mobility ?Overal bed mobility: Needs Assistance ?Bed Mobility: Supine to Sit, Sit to Supine ?  ?  ?Supine to sit: Modified independent (Device/Increase time) ?Sit to supine: Modified independent (Device/Increase time) ?  ?  ?  ? ?Transfers ?  ?  ?  ?  ?  ?  ?  ?  ?  ?General transfer comment: Pt able to perform lateral seated scoots at EOB ?  ?  ?Balance Overall balance assessment: Needs assistance ?Sitting-balance support: Bilateral upper extremity supported ?Sitting balance-Leahy Scale: Good ?  ?  ?  ?Standing balance-Leahy Scale: Zero ?Standing balance comment: Pt does not have prosthetic leg here at hospital ?  ?  ?  ?  ?  ?  ?  ?  ?  ?  ?  ?   ? ?ADL either performed or assessed with clinical judgement  ? ?ADL Overall ADL's : Needs assistance/impaired ?  ?  ?  ?  ?  ?  ?  ?  ?  ?  ?Lower Body Dressing: Moderate assistance;Supervision/safety ?Lower Body Dressing Details (indicate cue type and reason): required Mod A for donning boot, able to doff with SUPV, verbal cueing ?  ?  ?  ?  ?  ?  ?  ?  ?  ? ?  Extremity/Trunk Assessment Upper Extremity Assessment ?Upper Extremity Assessment: Overall WFL for tasks assessed ?  ?Lower Extremity Assessment ?Lower Extremity Assessment: RLE deficits/detail;LLE deficits/detail ?RLE Deficits / Details: AKA amputation ?LLE Deficits / Details: stage 2 ulcer on L heel ?  ?  ?  ? ?Vision   ?  ?  ?Perception   ?  ?Praxis   ?  ? ?Cognition Arousal/Alertness: Awake/alert ?  ?  ?  ?  ?  ?  ?  ?  ?  ?  ?  ?  ?  ?  ?  ?  ?   ?  ?  ?  ?  ?   ?Exercises Other Exercises ?Other Exercises: Educ re: importance of attention to skin integrity, protecting foot, falls prevention ? ?  ?Shoulder Instructions   ? ? ?  ?General Comments    ? ? ?Pertinent Vitals/ Pain       Pain Assessment ?Pain Assessment: No/denies pain ? ?Home Living   ?  ?  ?  ?  ?  ?  ?  ?  ?  ?  ?  ?  ?  ?  ?  ?  ?  ?  ? ?  ?Prior Functioning/Environment    ?  ?  ?  ?   ? ?Frequency ? Min 2X/week  ? ? ? ? ?  ?Progress Toward Goals ? ?OT Goals(current goals can now be found in the care plan section) ? Progress towards OT goals: Progressing toward goals ? ?Acute Rehab OT Goals ?OT Goal Formulation: With patient ?Time For Goal Achievement: 09/19/21 ?Potential to Achieve Goals: Good  ?Plan Discharge plan remains appropriate;Frequency remains appropriate   ? ?Co-evaluation ? ? ?   ?  ?  ?  ?  ? ?  ?AM-PAC OT "6 Clicks" Daily Activity     ?Outcome Measure ? ? Help from another person eating meals?: None ?Help from another person taking care of personal grooming?: A Little ?Help from another person toileting, which includes using toliet, bedpan, or urinal?: A Lot ?Help from another person bathing (including washing, rinsing, drying)?: A Lot ?Help from another person to put on and taking off regular upper body clothing?: A Little ?Help from another person to put on and taking off regular lower body clothing?: A Lot ?6 Click Score: 16 ? ?  ?End of Session Equipment Utilized During Treatment: Rolling walker (2 wheels) ? ?OT Visit Diagnosis: Muscle weakness (generalized) (M62.81) ?  ?Activity Tolerance Patient tolerated treatment well ?  ?Patient Left in bed;with call bell/phone within reach;with bed alarm set ?  ?Nurse Communication   ?  ? ?   ? ?Time: 4315-4008 ?OT Time Calculation (min): 25 min ? ?Charges: OT General Charges ?$OT Visit: 1 Visit ?OT Treatments ?$Self Care/Home Management : 23-37 mins ? ?Josiah Lobo, PhD, MS, OTR/L ?09/07/21, 12:29 PM ? ?

## 2021-09-07 NOTE — Progress Notes (Signed)
Inpatient Diabetes Program Recommendations ? ?AACE/ADA: New Consensus Statement on Inpatient Glycemic Control (2015) ? ?Target Ranges:  Prepandial:   less than 140 mg/dL ?     [facility] postprandial:   less than 180 mg/dL (1-2 hours) ?     Critically ill patients:  140 - 180 mg/dL  ? ?Lab Results  ?Component Value Date  ? GLUCAP 86 09/05/2021  ? ? ?Review of Glycemic Control ? Latest Reference Range & Units 09/06/21 04:37 09/07/21 04:09  ?Glucose 70 - 99 mg/dL 77 55 (L)  ?(L): Data is abnormally low ?Diabetes history: no noted hx ?Outpatient Diabetes medications: none ?Current orders for Inpatient glycemic control: none ? ?Inpatient Diabetes Program Recommendations:   ? ?Hypoglycemia this AM per glucose serum. May consider adding CBGs TID.  ? ?Thanks, ?Bronson Curb, MSN, RNC-OB ?Diabetes Coordinator ?[phone] (8a-5p) ? ? ? ? ?

## 2021-09-07 NOTE — Progress Notes (Signed)
?[facility]  ?PROGRESS NOTE  ? ?Subjective:  ? ?Patient laying in bed ?Alert and oriented ?States pain controlled at this time ?Tolerating meals  ? ?Dialysis yesterday, tolerated well ? ? ?Objective:  ?Vital signs: ?Blood pressure 136/74, pulse 100, temperature 98.2 ?F (36.8 ?C), resp. rate 18, height 5' 7.99" (1.727 m), weight 78.9 kg, SpO2 98 %. ?No intake or output data in the 24 hours ending 09/07/21 1235 ? ?Filed Weights  ? 09/05/21 0843 09/06/21 0931 09/06/21 1209  ?Weight: 82.9 kg 80.7 kg 78.9 kg  ? ? ? ?Physical Exam: ?General:  No acute distress  ?Head:  Normocephalic, atraumatic. Moist oral mucosal membranes  ?Eyes:  Anicteric  ?Lungs:   Clear to auscultation, normal effort  ?Heart:  S1S2 no rubs  ?Abdomen:   Soft, nontender, bowel sounds present  ?Extremities: S/p right AKA.  Nonhealing foot ulcers on the left.  ?Neurologic:  Awake, alert, following commands  ?Skin:  No lesions  ?Access: Rt Permcath  ? ? ?Basic Metabolic Panel: ?Recent Labs  ?Lab 09/04/21 ?1018 09/05/21 ?0503 09/05/21 ?1325 09/06/21 ?6712 09/07/21 ?0409  ?NA 139 142 140 141 134*  ?K 3.8 4.6 4.1 4.3 3.7  ?CL 102 106 104 103 94*  ?CO2 27 22 24 23 24   ?GLUCOSE 85 65* 83 77 55*  ?BUN 19 22 26* 33* 20  ?CREATININE 8.11* 8.64* 9.61* 10.92* 7.66*  ?CALCIUM 8.4* 8.1* 8.1* 8.2* 7.8*  ?PHOS  --   --  5.0*  --   --   ? ? ? ?CBC: ?Recent Labs  ?Lab 09/04/21 ?1018 09/05/21 ?0503 09/05/21 ?1325  ?WBC 14.7* 11.1* 11.4*  ?NEUTROABS 11.1*  --   --   ?HGB 10.6* 10.4* 10.2*  ?HCT 36.6* 36.3* 34.7*  ?MCV 100.5* 101.1* 99.1  ?PLT 249 210 206  ? ? ? ? ?Urinalysis: ?No results for input(s): COLORURINE, LABSPEC, [place], GLUCOSEU, HGBUR, BILIRUBINUR, KETONESUR, PROTEINUR, UROBILINOGEN, NITRITE, LEUKOCYTESUR in the last 72 hours. ? ?Invalid input(s): APPERANCEUR  ? ? ?Imaging: ?US ARTERIAL LOWER EXTREMITY DUPLEX LEFT (NON-ABI) ? ?Result Date: 09/06/2021 ?CLINICAL DATA:  Left heel trauma, hypertension, hyperlipidemia, coronary artery disease, history  of tobacco abuse EXAM: LEFT LOWER EXTREMITY ARTERIAL DUPLEX SCAN TECHNIQUE: Gray-scale sonography as well as color Doppler and duplex ultrasound was performed to evaluate the lower extremity arteries including the common, superficial and profunda femoral arteries, popliteal artery and calf arteries. COMPARISON:  None. FINDINGS: Left lower Extremity ABI: Not obtained. Inflow: Low velocity monophasic waveforms in the common femoral artery. Shadowing calcified plaque in the distal common femoral artery. Outflow: Abnormal monophasic waveforms in the deep femoral, SFA, and popliteal artery. Scattered calcified plaque in the proximal SFA.Patent stent in the distal SFA is identified. Runoff: Low velocity monophasic waveforms in visualized segments of anterior posterior tibial arteries. On the right, previous above knee amputation. IMPRESSION: 1. Abnormal monophasic waveforms throughout the left lower extremity suggesting proximal iliac arterial occlusive disease. 2. Patent stent in the distal SFA. Electronically Signed   By: Lucrezia Europe M.D.   On: 09/06/2021 19:20   ? ? ?Medications:  ? ? ? ? allopurinol  100 mg Oral QPM  ? apixaban  5 mg Oral BID  ? aspirin EC  81 mg Oral Daily  ? atorvastatin  10 mg Oral QHS  ? Chlorhexidine Gluconate Cloth  6 each Topical Q0600  ? clonazepam  0.5 mg Oral BID  ? docusate sodium  100 mg Oral BID  ? gabapentin  100 mg Oral BID  ? And  ?  gabapentin  100 mg Oral Once per day on Mon Wed Fri  ? sevelamer carbonate  2,400 mg Oral BID WC  ? ? ?Assessment/ Plan:  ?   ?Principal Problem: ?  Pressure injury of skin ?Active Problems: ?  ESRD on hemodialysis ([place]) ?  Hypertension ?  Chronic diastolic CHF (congestive heart failure) ([place]) ?  Malnutrition of moderate degree ?  Above knee amputation of right lower extremity ([place]) ?  PVD (peripheral vascular disease) ([place]) ? ?71 year old male with history of hypertension, coronary artery disease, congestive heart failure, severe peripheral arterial  disease, s/p above-the-knee amputation on the right side, end-stage renal disease on hemodialysis now comes to the emergency room with worsening swelling of the left lower extremity, fever and nonhealing ulcers.  He has been on Monday Wednesday Friday schedule for dialysis.  He had his dialysis Friday. ?  ?End stage renal disease on hemodialysis: Patient is on a Monday Wednesday Friday schedule for dialysis.  ? Received dialysis yesterday with UF goal 1.1L achieved Next treatment scheduled for Wednesday. Monitoring discharge plan to determine outpatient needs.  ?  ?Anemia of chronic kidney disease ?Lab Results  ?Component Value Date  ? HGB 10.2 (L) 09/05/2021  ?Hgb 10.2.  ?  ?Secondary Hyperparathyroidism:   ?Lab Results  ?Component Value Date  ? PTH 553 (H) 09/30/2015  ? CALCIUM 7.8 (L) 09/07/2021  ? CAION 0.98 (L) 07/01/2021  ? PHOS 5.0 (H) 09/05/2021  ? Will continue to monitor bone minerals during this admission.  ?  ?Hypertension with chronic kidney disease: Home regimen includes carvedilol and losartan. All held at this time. Blood pressure 136/74.  ?  ?S/p right AKA and nonhealing leg ulcers of left foot.  Continue the antibiotics as ordered. Podiatry consulted and will continue dressing changes with antibiotics for now.  ? ? LOS: 3 ?[place] ?[facility] ?5/2/202312:35 PM ?  ?

## 2021-09-07 NOTE — TOC Progression Note (Addendum)
Transition of Care (TOC) - Progression Note  ? ? ?Patient Details  ?Name: Redmond Whittley. ?MRN: [id number] ?Date of Birth: [birth year] ? ?Transition of Care (TOC) CM/SW Contact  ?Candie Chroman, LCSW ?Phone Number: ?09/07/2021, 9:40 AM ? ?Clinical Narrative:  Went by room to give bed offers. Patient working with Therapist, sports. Will try again later.  ? ?11:38 am: Patient initially accepted bed offer from [facility] but then they realized they were not in network with his [facility] plan. Reviewed other options with patient. He asked that [facility] review referral. Left message for admissions coordinator with request. ? ?2:24 pm: [facility] has made a bed offer. Patient is aware and agreeable. Left sister a voicemail. Will update when she calls back. Uploaded clinicals into [place] portal to start insurance authorization. ? ?Expected Discharge Plan: [place] ?Barriers to Discharge: Continued Medical Work up ? ?Expected Discharge Plan and Services ?Expected Discharge Plan: [place] ?  ?  ?Post Acute Care Choice: [place] ?Living arrangements for the past 2 months: Apartment ?                ?  ?  ?  ?  ?  ?  ?  ?  ?  ?  ? ? ?Social Determinants of Health (SDOH) Interventions ?  ? ?Readmission Risk Interventions ?   ? View : No data to display.  ?  ?  ?  ? ? ?

## 2021-09-07 NOTE — Progress Notes (Addendum)
?PROGRESS NOTE ? ? ? ?Johnny [name].  SPQ:[id number] DOB: [birth year] DOA: 09/04/2021 ?PCP: [name], MD  ?Outpatient Specialists: nephrology, vascular surgery ? ? ? ?Brief Narrative:  ? ?From admission h and p ?Johnny [name]. is an 71 y.o. male with PVD s/p R AKA who recently underwent revascularization of LLE s/p critical limb ischemia 2 weeks ago was improving until last week when he hit his left heel.  He notes that he subsequently noted redness around his heel and this was followed by pain which he describes as sharp and shooting and subsequently by swelling and increased redness.  Patient notes he came in today primarily because of pain.  Notes he has had "slight fevers" at home although is not sure how high.  No chills or sweats.  Has been tolerating dialysis without difficulty.  Was dialyzed yesterday. ? ? ?Assessment & Plan: ?  ?Principal Problem: ?  Pressure injury of skin ?Active Problems: ?  PVD (peripheral vascular disease) ([place]) ?  ESRD on hemodialysis ([place]) ?  Above knee amputation of right lower extremity ([place]) ?  Hypertension ?  Chronic diastolic CHF (congestive heart failure) ([place]) ?  Malnutrition of moderate degree ? ? ?# stage 2 pressure ulcer left heel ?# PAD ?With initial concern for cellulitis. X-rays no signs osteo. Patient says present a few weeks. Admitted a couple weeks ago with ischemia to that limb, treated with angioplasty and stent. Foot is warm. No signs infection on my exam; podiatry likewise does not think infected ?- podiatry and wound care consulted, wound care recs are in ?- f/u blood culture, ngtd ?- vascular ordered arterial duplex study on 5/1. Per secure chat with vascular app Ms. Owens Shark today, "The study notes he has possible significant illiac level disease but his recent angio noted these areas were diseased but not stenotic. Because his foot is warm and not in pain, we don't think we need to do anything further, depending on how his wound healing progresses"  They advise vascular f/u in 2 weeks ? ?# Debility ?Hcpoa says pt unable to care for self at home, sleeping in his hoverround chair. PT advises snf ?- TOC consulted for snf ? ?# ESRD ?On mwf hemodialysis. No signs fluid overload, k wnl. Right supclavian access. ?- nephrology consulted, currently plan is continue mwf hemodialysis. Received yesterday. ? ?# Hypoglycemia ?This morning, asymptomatic, resolved. Not on glucose-lowering meds ?- will monitor glucose more frequently, and i've discussed importance of regular meals ? ?# PAD ?- cont home apixaban, aspirin, aatorvastatin ? ?# HFpEF ?Compensated ?- cont home coreg ? ?# HTN ?Here bp wnl. Home losartan recently held ?- coreg as above ?- monitor ? ?# GAD ?- home clonazepam ? ?DVT prophylaxis: home eliquis ?Code Status: full ?Family Communication:  Elisabeth Pigeon updated 5/1 ? ?Level of care: Med-Surg ?Status is: Inpatient ?Remains inpatient appropriate because: pending snf placement ? ? ? ?Consultants:  ?podiatry ? ?Procedures: ?none ? ?Antimicrobials:  ?Cefepime/vancomycin>ceftriaxone, now off ? ? ?Subjective: ?Left heel pain persists. Mild nausea.  ? ?Objective: ?Vitals:  ? 09/06/21 1612 09/06/21 2012 09/07/21 3335 09/07/21 0817  ?BP: (!) 161/89 135/80 (!) 168/59 136/74  ?Pulse: 99 91 100 100  ?Resp: 18 18 20 18   ?Temp: 98.7 ?F (37.1 ?C)  98.1 ?F (36.7 ?C) 98.2 ?F (36.8 ?C)  ?TempSrc:   Oral   ?SpO2: 100% 100% 100% 98%  ?Weight:      ?Height:      ? ?No intake or output data in the  24 hours ending 09/07/21 1303 ? ?Filed Weights  ? 09/05/21 0843 09/06/21 0931 09/06/21 1209  ?Weight: 82.9 kg 80.7 kg 78.9 kg  ? ? ?Examination: ? ?General exam: Appears calm and comfortable  ?Respiratory system: Clear to auscultation. Respiratory effort normal. ?Cardiovascular system: S1 & S2 heard, RRR. No JVD, murmurs, rubs, gallops or clicks. No pedal edema. ?Gastrointestinal system: Abdomen is nondistended, soft and nontender. No organomegaly or masses felt. Normal bowel sounds  heard. ?Central nervous system: Alert and oriented. No focal neurological deficits. ?Extremities: right aka. Maceration around toes. Left foot is warm ?Skin: dressed stage 2 ulcer left heel ?Psychiatry: Judgement and insight appear normal. Mood & affect appropriate.  ? ? ? ?Data Reviewed: I have personally reviewed following labs and imaging studies ? ?CBC: ?Recent Labs  ?Lab 09/04/21 ?1018 09/05/21 ?0503 09/05/21 ?1325  ?WBC 14.7* 11.1* 11.4*  ?NEUTROABS 11.1*  --   --   ?HGB 10.6* 10.4* 10.2*  ?HCT 36.6* 36.3* 34.7*  ?MCV 100.5* 101.1* 99.1  ?PLT 249 210 206  ? ?Basic Metabolic Panel: ?Recent Labs  ?Lab 09/04/21 ?1018 09/05/21 ?0503 09/05/21 ?1325 09/06/21 ?1601 09/07/21 ?0409  ?NA 139 142 140 141 134*  ?K 3.8 4.6 4.1 4.3 3.7  ?CL 102 106 104 103 94*  ?CO2 27 22 24 23 24   ?GLUCOSE 85 65* 83 77 55*  ?BUN 19 22 26* 33* 20  ?CREATININE 8.11* 8.64* 9.61* 10.92* 7.66*  ?CALCIUM 8.4* 8.1* 8.1* 8.2* 7.8*  ?PHOS  --   --  5.0*  --   --   ? ?GFR: ?Estimated Creatinine Clearance: 8.4 mL/min (A) (by C-G formula based on SCr of 7.66 mg/dL (H)). ?Liver Function Tests: ?Recent Labs  ?Lab 09/04/21 ?1018 09/05/21 ?1325  ?AST 16  --   ?ALT 5  --   ?ALKPHOS 63  --   ?BILITOT 0.6  --   ?PROT 8.1  --   ?ALBUMIN 3.5 3.1*  ? ?No results for input(s): LIPASE, AMYLASE in the last 168 hours. ?No results for input(s): AMMONIA in the last 168 hours. ?Coagulation Profile: ?No results for input(s): INR, PROTIME in the last 168 hours. ?Cardiac Enzymes: ?No results for input(s): CKTOTAL, CKMB, CKMBINDEX, TROPONINI in the last 168 hours. ?BNP (last 3 results) ?No results for input(s): PROBNP in the last 8760 hours. ?HbA1C: ?No results for input(s): HGBA1C in the last 72 hours. ?CBG: ?Recent Labs  ?Lab 09/05/21 ?0852 09/05/21 ?1048 09/07/21 ?1223  ?GLUCAP 68* 86 81  ? ?Lipid Profile: ?No results for input(s): CHOL, HDL, LDLCALC, TRIG, CHOLHDL, LDLDIRECT in the last 72 hours. ?Thyroid Function Tests: ?No results for input(s): TSH, T4TOTAL, FREET4,  T3FREE, THYROIDAB in the last 72 hours. ?Anemia Panel: ?No results for input(s): VITAMINB12, FOLATE, FERRITIN, TIBC, IRON, RETICCTPCT in the last 72 hours. ?Urine analysis: ?No results found for: COLORURINE, APPEARANCEUR, [place], [place], [place], [place], [place], KETONESUR, PROTEINUR, [place], NITRITE, LEUKOCYTESUR ?Sepsis Labs: ?@[email or web address] (procalcitonin:4,lacticidven:4) ? ?) ?Recent Results (from the past 240 hour(s))  ?Blood culture (single)     Status: None (Preliminary result)  ? Collection Time: 09/04/21  1:13 PM  ? Specimen: BLOOD  ?Result Value Ref Range Status  ? Specimen Description   Final  ?  BLOOD Blood Culture results may not be optimal due to an excessive volume of blood received in culture bottles  ? Special Requests   Final  ?  BOTTLES DRAWN AEROBIC AND ANAEROBIC RIGHT ANTECUBITAL  ? Culture   Final  ?  NO GROWTH 3 DAYS ?Performed at [facility]  Lab, [address], [place], [place] [address] ?  ? Report Status PENDING  Incomplete  ?  ? ? ? ? ? ?Radiology Studies: ?US ARTERIAL LOWER EXTREMITY DUPLEX LEFT (NON-ABI) ? ?Result Date: 09/06/2021 ?CLINICAL DATA:  Left heel trauma, hypertension, hyperlipidemia, coronary artery disease, history of tobacco abuse EXAM: LEFT LOWER EXTREMITY ARTERIAL DUPLEX SCAN TECHNIQUE: Gray-scale sonography as well as color Doppler and duplex ultrasound was performed to evaluate the lower extremity arteries including the common, superficial and profunda femoral arteries, popliteal artery and calf arteries. COMPARISON:  None. FINDINGS: Left lower Extremity ABI: Not obtained. Inflow: Low velocity monophasic waveforms in the common femoral artery. Shadowing calcified plaque in the distal common femoral artery. Outflow: Abnormal monophasic waveforms in the deep femoral, SFA, and popliteal artery. Scattered calcified plaque in the proximal SFA.Patent stent in the distal SFA is identified. Runoff: Low velocity monophasic waveforms in visualized segments of anterior  posterior tibial arteries. On the right, previous above knee amputation. IMPRESSION: 1. Abnormal monophasic waveforms throughout the left lower extremity suggesting proximal iliac arterial occlusive disease

## 2021-09-07 NOTE — Plan of Care (Signed)
?  Problem: Education: ?Goal: Knowledge of General Education information will improve ?Description: Including pain rating scale, medication(s)/side effects and non-pharmacologic comfort measures ?Outcome: Progressing ?  ?Problem: Health Behavior/Discharge Planning: ?Goal: Ability to manage health-related needs will improve ?Outcome: Progressing ?  ?Problem: Nutrition: ?Goal: Adequate nutrition will be maintained ?Outcome: Progressing ?  ?Problem: Elimination: ?Goal: Will not experience complications related to bowel motility ?Outcome: Progressing ?  ?Problem: Elimination: ?Goal: Will not experience complications related to urinary retention ?Outcome: Progressing ?  ?Problem: Pain Managment: ?Goal: General experience of comfort will improve ?Outcome: Progressing ?  ?Problem: Safety: ?Goal: Ability to remain free from injury will improve ?Outcome: Progressing ?  ?Problem: Skin Integrity: ?Goal: Risk for impaired skin integrity will decrease ?Outcome: Progressing ?  ?

## 2021-09-08 DIAGNOSIS — L89000 Pressure ulcer of unspecified elbow, unstageable: Secondary | ICD-10-CM | POA: Diagnosis not present

## 2021-09-08 LAB — GLUCOSE, CAPILLARY: Glucose-Capillary: 86 mg/dL (ref 70–99)

## 2021-09-08 LAB — BASIC METABOLIC PANEL
Anion gap: 15 (ref 5–15)
BUN: 31 mg/dL — ABNORMAL HIGH (ref 8–23)
CO2: 26 mmol/L (ref 22–32)
Calcium: 8.5 mg/dL — ABNORMAL LOW (ref 8.9–10.3)
Chloride: 96 mmol/L — ABNORMAL LOW (ref 98–111)
Creatinine, Ser: 9.5 mg/dL — ABNORMAL HIGH (ref 0.61–1.24)
GFR, Estimated: 5 mL/min — ABNORMAL LOW (ref >60)
Glucose, Bld: 71 mg/dL (ref 70–99)
Potassium: 4.9 mmol/L (ref 3.5–5.1)
Sodium: 137 mmol/L (ref 135–145)

## 2021-09-08 LAB — PARATHYROID HORMONE, INTACT (NO CA): PTH: 354 pg/mL — ABNORMAL HIGH (ref 15–65)

## 2021-09-08 MED ORDER — HEPARIN SODIUM (PORCINE) 1000 UNIT/ML IJ SOLN
INTRAMUSCULAR | Status: AC
  Filled 2021-09-08: qty 10

## 2021-09-08 MED ORDER — OXYCODONE-ACETAMINOPHEN 5-325 MG PO TABS: 2.0000 | ORAL_TABLET | Freq: Four times a day (QID) | ORAL | 0 refills | Status: DC | PRN

## 2021-09-08 MED ORDER — ACETAMINOPHEN 325 MG PO TABS
ORAL_TABLET | ORAL | Status: AC
  Filled 2021-09-08: qty 2

## 2021-09-08 MED ORDER — CARVEDILOL 6.25 MG PO TABS
3.1250 mg | ORAL_TABLET | Freq: Two times a day (BID) | ORAL | Status: DC
Start: 2021-09-08 — End: 2021-09-08

## 2021-09-08 MED ORDER — ONDANSETRON HCL 4 MG/2ML IJ SOLN: 4.0000 mg | Freq: Four times a day (QID) | INTRAMUSCULAR | Status: DC | PRN

## 2021-09-08 MED ORDER — CLONAZEPAM 0.5 MG PO TABS: 0.5000 mg | ORAL_TABLET | ORAL | 0 refills | Status: DC | PRN

## 2021-09-08 MED ORDER — POLYETHYLENE GLYCOL 3350 17 G PO PACK: 17.0000 g | PACK | Freq: Every day | ORAL | 0 refills | Status: DC | PRN

## 2021-09-08 NOTE — TOC Progression Note (Addendum)
Transition of Care (TOC) - Progression Note  ? ? ?Patient Details  ?Name: Johnny [name]. ?MRN: [id number] ?Date of Birth: 1951/05/06 ? ?Transition of Care (TOC) CM/SW Contact  ?Candie Chroman, LCSW ?Phone Number: ?09/08/2021, 8:22 AM ? ?Clinical Narrative:  Insurance authorization approved: [id number]. Valid 5/3-5/5. Sister called CSW back late yesterday. Provided update.  ? ?12:58 pm: Updated friend/HCPOA, [name]. ? ?Expected Discharge Plan: [place] ?Barriers to Discharge: Continued Medical Work up ? ?Expected Discharge Plan and Services ?Expected Discharge Plan: [place] ?  ?  ?Post Acute Care Choice: [place] ?Living arrangements for the past 2 months: Apartment ?                ?  ?  ?  ?  ?  ?  ?  ?  ?  ?  ? ? ?Social Determinants of Health (SDOH) Interventions ?  ? ?Readmission Risk Interventions ?   ? View : No data to display.  ?  ?  ?  ? ? ?

## 2021-09-08 NOTE — Progress Notes (Signed)
?Johnny [name]  ?PROGRESS NOTE  ? ?Subjective:  ? ?Patient seen and evaluated during dialysis ?  ?HEMODIALYSIS FLOWSHEET: ? ?Blood Flow Rate (mL/min): 400 mL/min ?Arterial Pressure (mmHg): -190 mmHg ?Venous Pressure (mmHg): 160 mmHg ?Transmembrane Pressure (mmHg): 60 mmHg ?Ultrafiltration Rate (mL/min): 410 mL/min ?Dialysate Flow Rate (mL/min): 500 ml/min ?Conductivity: Machine : 13.7 ?Conductivity: Machine : 13.7 ?Dialysis Fluid Bolus: Normal Saline ?Bolus Amount (mL): 250 mL ? ?HD RN reports hypotension during treatment resulting in patient having nausea and vomiting. Blood pressure corrected, nausea remains ? ? ?Objective:  ?Vital signs: ?Blood pressure 100/63, pulse 81, temperature 97.9 ?F (36.6 ?C), resp. rate 12, height 5' 7.99" (1.727 m), weight 82.5 kg, SpO2 98 %. ? ?Intake/Output Summary (Last 24 hours) at 09/08/2021 1057 ?Last data filed at 09/08/2021 0500 ?Gross per 24 hour  ?Intake 480 ml  ?Output --  ?Net 480 ml  ? ? ?Filed Weights  ? 09/06/21 1209 09/08/21 0500 09/08/21 0833  ?Weight: 78.9 kg 84.2 kg 82.5 kg  ? ? ? ?Physical Exam: ?General:  No acute distress  ?Head:  Normocephalic, atraumatic. Moist oral mucosal membranes  ?Eyes:  Anicteric  ?Lungs:   Clear to auscultation, normal effort  ?Heart:  S1S2 no rubs  ?Abdomen:   Soft, nontender, bowel sounds present  ?Extremities: S/p right AKA.  Nonhealing foot ulcers on the left.No edema  ?Neurologic:  Awake, alert, following commands  ?Skin:  No lesions  ?Access: Rt Permcath  ? ? ?Basic Metabolic Panel: ?Recent Labs  ?Lab 09/05/21 ?0503 09/05/21 ?1325 09/06/21 ?8841 09/07/21 ?6606 09/08/21 ?0427  ?NA 142 140 141 134* 137  ?K 4.6 4.1 4.3 3.7 4.9  ?CL 106 104 103 94* 96*  ?CO2 22 24 23 24 26   ?GLUCOSE 65* 83 77 55* 71  ?BUN 22 26* 33* 20 31*  ?CREATININE 8.64* 9.61* 10.92* 7.66* 9.50*  ?CALCIUM 8.1* 8.1* 8.2* 7.8* 8.5*  ?PHOS  --  5.0*  --   --   --   ? ? ? ?CBC: ?Recent Labs  ?Lab 09/04/21 ?1018 09/05/21 ?0503 09/05/21 ?1325  ?WBC 14.7* 11.1* 11.4*   ?NEUTROABS 11.1*  --   --   ?HGB 10.6* 10.4* 10.2*  ?HCT 36.6* 36.3* 34.7*  ?MCV 100.5* 101.1* 99.1  ?PLT 249 210 206  ? ? ? ? ?Urinalysis: ?No results for input(s): COLORURINE, LABSPEC, [place], GLUCOSEU, HGBUR, BILIRUBINUR, KETONESUR, PROTEINUR, UROBILINOGEN, NITRITE, LEUKOCYTESUR in the last 72 hours. ? ?Invalid input(s): APPERANCEUR  ? ? ?Imaging: ?US ARTERIAL LOWER EXTREMITY DUPLEX LEFT (NON-ABI) ? ?Result Date: 09/06/2021 ?CLINICAL DATA:  Left heel trauma, hypertension, hyperlipidemia, coronary artery disease, history of tobacco abuse EXAM: LEFT LOWER EXTREMITY ARTERIAL DUPLEX SCAN TECHNIQUE: Gray-scale sonography as well as color Doppler and duplex ultrasound was performed to evaluate the lower extremity arteries including the common, superficial and profunda femoral arteries, popliteal artery and calf arteries. COMPARISON:  None. FINDINGS: Left lower Extremity ABI: Not obtained. Inflow: Low velocity monophasic waveforms in the common femoral artery. Shadowing calcified plaque in the distal common femoral artery. Outflow: Abnormal monophasic waveforms in the deep femoral, SFA, and popliteal artery. Scattered calcified plaque in the proximal SFA.Patent stent in the distal SFA is identified. Runoff: Low velocity monophasic waveforms in visualized segments of anterior posterior tibial arteries. On the right, previous above knee amputation. IMPRESSION: 1. Abnormal monophasic waveforms throughout the left lower extremity suggesting proximal iliac arterial occlusive disease. 2. Patent stent in the distal SFA. Electronically Signed   By: Lucrezia Europe M.D.   On: 09/06/2021  19:20   ? ? ?Medications:  ? ? ? ? acetaminophen      ? allopurinol  100 mg Oral QPM  ? apixaban  5 mg Oral BID  ? aspirin EC  81 mg Oral Daily  ? atorvastatin  10 mg Oral QHS  ? carvedilol  3.125 mg Oral BID  ? Chlorhexidine Gluconate Cloth  6 each Topical Q0600  ? clonazepam  0.5 mg Oral BID  ? docusate sodium  100 mg Oral BID  ? gabapentin  100 mg  Oral BID  ? And  ? gabapentin  100 mg Oral Once per day on Mon Wed Fri  ? sevelamer carbonate  2,400 mg Oral TID WC  ? ? ?Assessment/ Plan:  ?   ?Principal Problem: ?  Pressure injury of skin ?Active Problems: ?  ESRD on hemodialysis ([place]) ?  Hypertension ?  Chronic diastolic CHF (congestive heart failure) ([place]) ?  Malnutrition of moderate degree ?  Above knee amputation of right lower extremity ([place]) ?  PVD (peripheral vascular disease) ([place]) ? ?71 year old male with history of hypertension, coronary artery disease, congestive heart failure, severe peripheral arterial disease, s/p above-the-knee amputation on the right side, end-stage renal disease on hemodialysis now comes to the emergency room with worsening swelling of the left lower extremity, fever and nonhealing ulcers.  He has been on Monday Wednesday Friday schedule for dialysis.  He had his dialysis Friday. ?  ?End stage renal disease on hemodialysis: Patient is on a Monday Wednesday Friday schedule for dialysis.  ? Receiving dialysis today, UF goal reduced to 1L due to symptomatic hypotension. BP stable with new UF goal. Patient will be discharged to rehab today and will continue treatments at outpatient clinic.  ?  ?Anemia of chronic [name] disease ?Lab Results  ?Component Value Date  ? HGB 10.2 (L) 09/05/2021  ? ?Will continue to monitor ?  ?Secondary Hyperparathyroidism:   ?Lab Results  ?Component Value Date  ? PTH 553 (H) 09/30/2015  ? CALCIUM 8.5 (L) 09/08/2021  ? CAION 0.98 (L) 07/01/2021  ? PHOS 5.0 (H) 09/05/2021  ? ?Calcium and phosphorus within acceptable range.  ?  ?Hypertension with chronic [name] disease: Home regimen includes carvedilol and losartan. All held at this time.  ?Blood pressure declined during dialysis, UF turn off briefly and BP now 105/38. UF reduced and monitoring closely ?  ?S/p right AKA and nonhealing leg ulcers of left foot.  Continue the antibiotics as ordered. Podiatry consulted and will continue dressing changes with  antibiotics for now.  ? ? LOS: 4 ?[place] ?[name] ?5/3/202310:57 AM ?  ?

## 2021-09-08 NOTE — TOC Transition Note (Signed)
Transition of Care (TOC) - CM/SW Discharge Note ? ? ?Patient Details  ?Name: Johnny [name]. ?MRN: [id number] ?Date of Birth: [birth year] ? ?Transition of Care (TOC) CM/SW Contact:  ?Candie Chroman, LCSW ?Phone Number: ?09/08/2021, 2:47 PM ? ? ?Clinical Narrative:  Patient has orders to discharge to [facility]. RN has already called report. EMS transport has been arranged. No further concerns. CSW signing off. ? ?Final next level of care: [place] ?Barriers to Discharge: Barriers Resolved ? ? ?Patient Goals and CMS Choice ?  ?CMS Medicare.gov Compare Post Acute Care list provided to:: Patient ?Choice offered to / list presented to : Patient ? ?Discharge Placement ?  ?Existing PASRR number confirmed : 09/06/21          ?Patient chooses bed at: [facility] [place] ?Patient to be transferred to facility by: EMS ?Name of family member notified: [name] and Marrianne Mood ?Patient and family notified of of transfer: 09/08/21 ? ?Discharge Plan and Services ?  ?  ?Post Acute Care Choice: [place]          ?  ?  ?  ?  ?  ?  ?  ?  ?  ?  ? ?Social Determinants of Health (SDOH) Interventions ?  ? ? ?Readmission Risk Interventions ?   ? View : No data to display.  ?  ?  ?  ? ? ? ? ? ?
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
## 2021-09-08 NOTE — Progress Notes (Signed)
Hemodialysis Post Treatment Note ? ?Patient completes scheduled 3 hour treatment. Right subclavian cathter functions well, BFR maintained throughout course of treatment. Patient experiences bouts of emesis and hypotension resulting in a reduction of targeted fluid removal. Patient completes treatment in stable condition, with 479 ml fluid removal;, no signs of infection, with discussion of possible discharge. within 24 hours. Patient transported back to assigned room.  ?

## 2021-09-08 NOTE — Plan of Care (Signed)

## 2021-09-08 NOTE — Progress Notes (Signed)
OT Cancellation Note ? ?Patient Details ?Name: Johnny [name]. ?MRN: [id number] ?DOB: [birth year] ? ? ?Cancelled Treatment:    Reason Eval/Treat Not Completed: Patient at procedure or test/ unavailable. Pt out of room for hemodialysis at present. Will attempt OT service at a later time/date as pt is available. ? ?Josiah Lobo ?09/08/2021, 12:03 PM ?

## 2021-09-08 NOTE — Care Management Important Message (Signed)
Important Message ? ?Patient Details  ?Name: Johnny [name]. ?MRN: [id number] ?Date of Birth: 1950-05-13 ? ? ?Medicare Important Message Given:  Yes ? ?I reviewed the Important Message from Medicare with HCPOA, [name] by phone [phone]) and she is in agreement with the discharge plan.  No copy needed. I thanked her for her time. ? ? ?Juliann Pulse A Nilam Quakenbush ?09/08/2021, 2:31 PM ?

## 2021-09-08 NOTE — Progress Notes (Signed)
? ?      CROSS COVER NOTE ? ?NAME: Johnny [name]. ?MRN: [id number] ?DOB : October 19, 1950 ? ? ? ?Date of Service ?  09/08/21  ?HPI/Events of Note ?  Patient hypertensive this AM. BP 174/123. Per Dr Marcia Brash note yesterday there were plans to restart home Coreg. Will enter Coreg order.  ?Interventions ?  Plan: ?Coreg 3.125mg  BID    ?  ? ?Neomia Glass MHA, MSN, FNP-BC ?Nurse Practitioner ?[facility] ?[place] ?Pager [phone] ? ? ?

## 2021-09-09 LAB — CULTURE, BLOOD (SINGLE): Culture: NO GROWTH

## 2021-09-16 ENCOUNTER — Emergency Department: Payer: Medicare Other

## 2021-09-16 ENCOUNTER — Inpatient Hospital Stay
Admission: EM | Admit: 2021-09-16 | Discharge: 2021-09-21 | DRG: 871 | Disposition: A | Discharge status: Hospice | Payer: Medicare Other | Attending: Internal Medicine | Admitting: Internal Medicine

## 2021-09-16 ENCOUNTER — Other Ambulatory Visit: Payer: Self-pay

## 2021-09-16 DIAGNOSIS — Y95 Nosocomial condition: Secondary | ICD-10-CM | POA: Diagnosis present

## 2021-09-16 DIAGNOSIS — F419 Anxiety disorder, unspecified: Secondary | ICD-10-CM | POA: Diagnosis present

## 2021-09-16 DIAGNOSIS — Z91013 Allergy to seafood: Secondary | ICD-10-CM

## 2021-09-16 DIAGNOSIS — I132 Hypertensive heart and chronic kidney disease with heart failure and with stage 5 chronic kidney disease, or end stage renal disease: Secondary | ICD-10-CM | POA: Diagnosis present

## 2021-09-16 DIAGNOSIS — I2510 Atherosclerotic heart disease of native coronary artery without angina pectoris: Secondary | ICD-10-CM | POA: Diagnosis present

## 2021-09-16 DIAGNOSIS — N186 End stage renal disease: Secondary | ICD-10-CM | POA: Diagnosis present

## 2021-09-16 DIAGNOSIS — G629 Polyneuropathy, unspecified: Secondary | ICD-10-CM | POA: Diagnosis present

## 2021-09-16 DIAGNOSIS — J189 Pneumonia, unspecified organism: Secondary | ICD-10-CM | POA: Diagnosis present

## 2021-09-16 DIAGNOSIS — Z961 Presence of intraocular lens: Secondary | ICD-10-CM | POA: Diagnosis present

## 2021-09-16 DIAGNOSIS — Z7982 Long term (current) use of aspirin: Secondary | ICD-10-CM

## 2021-09-16 DIAGNOSIS — J9601 Acute respiratory failure with hypoxia: Secondary | ICD-10-CM | POA: Diagnosis not present

## 2021-09-16 DIAGNOSIS — Z7901 Long term (current) use of anticoagulants: Secondary | ICD-10-CM

## 2021-09-16 DIAGNOSIS — L89620 Pressure ulcer of left heel, unstageable: Secondary | ICD-10-CM | POA: Diagnosis present

## 2021-09-16 DIAGNOSIS — E162 Hypoglycemia, unspecified: Secondary | ICD-10-CM | POA: Diagnosis not present

## 2021-09-16 DIAGNOSIS — G8929 Other chronic pain: Secondary | ICD-10-CM | POA: Diagnosis present

## 2021-09-16 DIAGNOSIS — J9621 Acute and chronic respiratory failure with hypoxia: Secondary | ICD-10-CM | POA: Diagnosis present

## 2021-09-16 DIAGNOSIS — N2581 Secondary hyperparathyroidism of renal origin: Secondary | ICD-10-CM | POA: Diagnosis present

## 2021-09-16 DIAGNOSIS — Z992 Dependence on renal dialysis: Secondary | ICD-10-CM

## 2021-09-16 DIAGNOSIS — G928 Other toxic encephalopathy: Secondary | ICD-10-CM | POA: Diagnosis present

## 2021-09-16 DIAGNOSIS — A419 Sepsis, unspecified organism: Principal | ICD-10-CM | POA: Diagnosis present

## 2021-09-16 DIAGNOSIS — J9602 Acute respiratory failure with hypercapnia: Secondary | ICD-10-CM | POA: Diagnosis not present

## 2021-09-16 DIAGNOSIS — R6520 Severe sepsis without septic shock: Secondary | ICD-10-CM | POA: Diagnosis present

## 2021-09-16 DIAGNOSIS — G9341 Metabolic encephalopathy: Secondary | ICD-10-CM | POA: Diagnosis present

## 2021-09-16 DIAGNOSIS — Z86718 Personal history of other venous thrombosis and embolism: Secondary | ICD-10-CM

## 2021-09-16 DIAGNOSIS — Z20822 Contact with and (suspected) exposure to covid-19: Secondary | ICD-10-CM | POA: Diagnosis present

## 2021-09-16 DIAGNOSIS — L89322 Pressure ulcer of left buttock, stage 2: Secondary | ICD-10-CM | POA: Diagnosis present

## 2021-09-16 DIAGNOSIS — M109 Gout, unspecified: Secondary | ICD-10-CM | POA: Diagnosis present

## 2021-09-16 DIAGNOSIS — I959 Hypotension, unspecified: Secondary | ICD-10-CM | POA: Diagnosis not present

## 2021-09-16 DIAGNOSIS — I70222 Atherosclerosis of native arteries of extremities with rest pain, left leg: Secondary | ICD-10-CM | POA: Diagnosis present

## 2021-09-16 DIAGNOSIS — I429 Cardiomyopathy, unspecified: Secondary | ICD-10-CM | POA: Diagnosis present

## 2021-09-16 DIAGNOSIS — J9622 Acute and chronic respiratory failure with hypercapnia: Secondary | ICD-10-CM | POA: Diagnosis present

## 2021-09-16 DIAGNOSIS — I252 Old myocardial infarction: Secondary | ICD-10-CM

## 2021-09-16 DIAGNOSIS — Z515 Encounter for palliative care: Secondary | ICD-10-CM

## 2021-09-16 DIAGNOSIS — I5032 Chronic diastolic (congestive) heart failure: Secondary | ICD-10-CM | POA: Diagnosis present

## 2021-09-16 DIAGNOSIS — Z79899 Other long term (current) drug therapy: Secondary | ICD-10-CM

## 2021-09-16 DIAGNOSIS — R4182 Altered mental status, unspecified: Secondary | ICD-10-CM

## 2021-09-16 DIAGNOSIS — Z89511 Acquired absence of right leg below knee: Secondary | ICD-10-CM

## 2021-09-16 DIAGNOSIS — Z66 Do not resuscitate: Secondary | ICD-10-CM | POA: Diagnosis present

## 2021-09-16 DIAGNOSIS — Z8249 Family history of ischemic heart disease and other diseases of the circulatory system: Secondary | ICD-10-CM

## 2021-09-16 DIAGNOSIS — E785 Hyperlipidemia, unspecified: Secondary | ICD-10-CM | POA: Diagnosis present

## 2021-09-16 DIAGNOSIS — Z87891 Personal history of nicotine dependence: Secondary | ICD-10-CM

## 2021-09-16 DIAGNOSIS — Z833 Family history of diabetes mellitus: Secondary | ICD-10-CM

## 2021-09-16 DIAGNOSIS — M8580 Other specified disorders of bone density and structure, unspecified site: Secondary | ICD-10-CM | POA: Diagnosis present

## 2021-09-16 DIAGNOSIS — Z9842 Cataract extraction status, left eye: Secondary | ICD-10-CM

## 2021-09-16 DIAGNOSIS — Z89611 Acquired absence of right leg above knee: Secondary | ICD-10-CM

## 2021-09-16 LAB — BLOOD GAS, VENOUS
Acid-Base Excess: 0.2 mmol/L (ref 0.0–2.0)
Acid-Base Excess: 4.3 mmol/L — ABNORMAL HIGH (ref 0.0–2.0)
Acid-Base Excess: 0.8 mmol/L (ref 0.0–2.0)
Bicarbonate: 29.9 mmol/L — ABNORMAL HIGH (ref 20.0–28.0)
Bicarbonate: 33.4 mmol/L — ABNORMAL HIGH (ref 20.0–28.0)
Bicarbonate: 29.8 mmol/L — ABNORMAL HIGH (ref 20.0–28.0)
Drawn by: 7021
O2 Saturation: 56.3 %
O2 Saturation: 26.6 %
O2 Saturation: 42.4 %
Patient temperature: 37
Patient temperature: 37
Patient temperature: 37
pCO2, Ven: 73 mmHg (ref 44–60)
pCO2, Ven: 71 mmHg (ref 44–60)
pCO2, Ven: 68 mmHg — ABNORMAL HIGH (ref 44–60)
pH, Ven: 7.22 — ABNORMAL LOW (ref 7.25–7.43)
pH, Ven: 7.28 (ref 7.25–7.43)
pH, Ven: 7.25 (ref 7.25–7.43)
pO2, Ven: 41 mmHg (ref 32–45)
pO2, Ven: <31 mmHg — CL (ref 32–45)
pO2, Ven: 31 mmHg — CL (ref 32–45)

## 2021-09-16 LAB — RESP PANEL BY RT-PCR (FLU A&B, COVID) ARPGX2
Influenza A by PCR: NEGATIVE
Influenza B by PCR: NEGATIVE
SARS Coronavirus 2 by RT PCR: NEGATIVE

## 2021-09-16 LAB — CBG MONITORING, ED
Glucose-Capillary: 98 mg/dL (ref 70–99)
Glucose-Capillary: 136 mg/dL — ABNORMAL HIGH (ref 70–99)

## 2021-09-16 LAB — LACTIC ACID, PLASMA: Lactic Acid, Venous: 1.1 mmol/L (ref 0.5–1.9)

## 2021-09-16 LAB — CBC
HCT: 36.2 % — ABNORMAL LOW (ref 39.0–52.0)
Hemoglobin: 10.4 g/dL — ABNORMAL LOW (ref 13.0–17.0)
MCH: 29.1 pg (ref 26.0–34.0)
MCHC: 28.7 g/dL — ABNORMAL LOW (ref 30.0–36.0)
MCV: 101.1 fL — ABNORMAL HIGH (ref 80.0–100.0)
Platelets: 302 10*3/uL (ref 150–400)
RBC: 3.58 MIL/uL — ABNORMAL LOW (ref 4.22–5.81)
RDW: 16 % — ABNORMAL HIGH (ref 11.5–15.5)
WBC: 15.2 10*3/uL — ABNORMAL HIGH (ref 4.0–10.5)
nRBC: 0 % (ref 0.0–0.2)

## 2021-09-16 LAB — COMPREHENSIVE METABOLIC PANEL
ALT: 6 U/L (ref 0–44)
AST: 16 U/L (ref 15–41)
Albumin: 3.2 g/dL — ABNORMAL LOW (ref 3.5–5.0)
Alkaline Phosphatase: 55 U/L (ref 38–126)
Anion gap: 12 (ref 5–15)
BUN: 15 mg/dL (ref 8–23)
CO2: 27 mmol/L (ref 22–32)
Calcium: 8.4 mg/dL — ABNORMAL LOW (ref 8.9–10.3)
Chloride: 100 mmol/L (ref 98–111)
Creatinine, Ser: 5.77 mg/dL — ABNORMAL HIGH (ref 0.61–1.24)
GFR, Estimated: 10 mL/min — ABNORMAL LOW (ref >60)
Glucose, Bld: 93 mg/dL (ref 70–99)
Potassium: 3.9 mmol/L (ref 3.5–5.1)
Sodium: 139 mmol/L (ref 135–145)
Total Bilirubin: 0.5 mg/dL (ref 0.3–1.2)
Total Protein: 7.6 g/dL (ref 6.5–8.1)

## 2021-09-16 LAB — TROPONIN I (HIGH SENSITIVITY): Troponin I (High Sensitivity): 34 ng/L — ABNORMAL HIGH (ref <18)

## 2021-09-16 LAB — GLUCOSE, CAPILLARY: Glucose-Capillary: 76 mg/dL (ref 70–99)

## 2021-09-16 LAB — TSH: TSH: 1 u[IU]/mL (ref 0.350–4.500)

## 2021-09-16 LAB — AMMONIA: Ammonia: 21 umol/L (ref 9–35)

## 2021-09-16 LAB — PROCALCITONIN: Procalcitonin: 1.39 ng/mL

## 2021-09-16 MED ORDER — APIXABAN 5 MG PO TABS
5.0000 mg | ORAL_TABLET | Freq: Two times a day (BID) | ORAL | Status: DC
Start: 2021-09-16 — End: 2021-09-17
  Administered 2021-09-16: 5 mg via ORAL
  Filled 2021-09-16: qty 1

## 2021-09-16 MED ORDER — ASPIRIN EC 81 MG PO TBEC: 81.0000 mg | DELAYED_RELEASE_TABLET | Freq: Every day | ORAL | Status: DC

## 2021-09-16 MED ORDER — PROCHLORPERAZINE EDISYLATE 10 MG/2ML IJ SOLN
10.0000 mg | Freq: Four times a day (QID) | INTRAMUSCULAR | Status: DC | PRN
  Administered 2021-09-16: 10 mg via INTRAVENOUS
  Filled 2021-09-16 (×2): qty 2

## 2021-09-16 MED ORDER — ACETAMINOPHEN 325 MG PO TABS: 650.0000 mg | ORAL_TABLET | Freq: Four times a day (QID) | ORAL | Status: DC | PRN

## 2021-09-16 MED ORDER — VANCOMYCIN HCL IN DEXTROSE 1-5 GM/200ML-% IV SOLN
1000.0000 mg | INTRAVENOUS | Status: DC
Start: 2021-09-18 — End: 2021-09-19
  Administered 2021-09-18: 1000 mg via INTRAVENOUS
  Filled 2021-09-16: qty 200

## 2021-09-16 MED ORDER — ENOXAPARIN SODIUM 40 MG/0.4ML IJ SOSY: 40.0000 mg | PREFILLED_SYRINGE | INTRAMUSCULAR | Status: DC

## 2021-09-16 MED ORDER — OXYCODONE-ACETAMINOPHEN 5-325 MG PO TABS
2.0000 | ORAL_TABLET | Freq: Three times a day (TID) | ORAL | Status: DC | PRN
  Administered 2021-09-17: 2 via ORAL
  Filled 2021-09-16: qty 2

## 2021-09-16 MED ORDER — SODIUM CHLORIDE 0.9 % IV SOLN
2.0000 g | Freq: Once | INTRAVENOUS | Status: AC
  Administered 2021-09-16: 2 g via INTRAVENOUS
  Filled 2021-09-16: qty 12.5

## 2021-09-16 MED ORDER — VANCOMYCIN HCL IN DEXTROSE 1-5 GM/200ML-% IV SOLN
1000.0000 mg | Freq: Once | INTRAVENOUS | Status: AC
  Administered 2021-09-16: 1000 mg via INTRAVENOUS
  Filled 2021-09-16: qty 200

## 2021-09-16 MED ORDER — POLYETHYLENE GLYCOL 3350 17 G PO PACK: 17.0000 g | PACK | Freq: Every day | ORAL | Status: DC | PRN

## 2021-09-16 MED ORDER — ATORVASTATIN CALCIUM 10 MG PO TABS
10.0000 mg | ORAL_TABLET | Freq: Every day | ORAL | Status: DC
  Administered 2021-09-16: 10 mg via ORAL
  Filled 2021-09-16: qty 1

## 2021-09-16 MED ORDER — SODIUM CHLORIDE 0.9 % IV BOLUS
500.0000 mL | Freq: Once | INTRAVENOUS | Status: AC
  Administered 2021-09-16: 500 mL via INTRAVENOUS

## 2021-09-16 MED ORDER — HEPARIN SODIUM (PORCINE) 5000 UNIT/ML IJ SOLN: 5000.0000 [IU] | Freq: Three times a day (TID) | INTRAMUSCULAR | Status: DC

## 2021-09-16 MED ORDER — HYDROMORPHONE HCL 1 MG/ML IJ SOLN: 0.5000 mg | INTRAMUSCULAR | Status: DC | PRN

## 2021-09-16 MED ORDER — VANCOMYCIN HCL 750 MG/150ML IV SOLN: 750.0000 mg | INTRAVENOUS | Status: DC

## 2021-09-16 MED ORDER — SODIUM CHLORIDE 0.9 % IV SOLN
1.0000 g | INTRAVENOUS | Status: DC
  Administered 2021-09-17 – 2021-09-18 (×2): 1 g via INTRAVENOUS
  Filled 2021-09-16 (×2): qty 1

## 2021-09-16 MED ORDER — SEVELAMER CARBONATE 800 MG PO TABS: 2400.0000 mg | ORAL_TABLET | Freq: Three times a day (TID) | ORAL | Status: DC

## 2021-09-16 MED ORDER — CHLORHEXIDINE GLUCONATE CLOTH 2 % EX PADS
6.0000 | MEDICATED_PAD | Freq: Every day | CUTANEOUS | Status: DC
  Administered 2021-09-17 – 2021-09-19 (×2): 6 via TOPICAL

## 2021-09-16 NOTE — ED Provider Notes (Signed)
? ?[facility] ?Provider Note ? ? ? Event Date/Time  ? First MD Initiated Contact with Patient 09/16/21 1002   ?  (approximate) ? ? ?History  ? ?Altered Mental Status (Patient was at dialysis and had sudden onset confusion around 0900; EMS reported he was only oriented to self; Upon arrival here, he has a GCS of 14, oriented to self and place, but does not know why he is here and has some trouble answering questions; Only complaint is his chronic pain) ? ? ?HPI ? ?Johnny [name]. is a 71 y.o. male 1 review of recent discharge summary from May 3 of this year which I reviewed has a history of pressure sore, peripheral vascular disease end-stage renal disease previous right lower extremity amputation hypertension CHF malnutrition recently treated for cellulitis and abscess to the left lower extremity and had left lower extremity critical limb ischemia ? ?Patient tells me that yesterday he did not quite feel himself.  He cannot really describe it well.  Reports a little bit of achiness over the back of his left heel but that is been present for several days.  No fever.  No chest pain or trouble breathing.  He just reports he just does not quite feel right.  He cannot really describe it well.  He is not sure of the exact timing when this occurred but it started sometime yesterday ? ?He went to dialysis today.  And he is not able to give me a good sustained history of what happened other than he just did not feel right.  Nurse reports that EMS reported patient was having altered mental status and also hypoxia. ? ?Currently patient is on 2 L nasal cannula oxygen saturation 99%.  He denies feeling short of breath.  Just reports he does not quite feel right and he cannot describe it too well.  He is somewhat slow in his cognition and has to think a long time before answering questions.  Reports his only pain right now is over his left heel he does not feel short of breath or have any other pain or  discomfort.  Specifically denies headache chest pain abdominal pain or difficulty breathing ?  ? ? ?Physical Exam  ? ?Triage Vital Signs: ?ED Triage Vitals  ?Enc Vitals Group  ?   BP 09/16/21 0951 94/66  ?   Pulse Rate 09/16/21 0951 (!) 101  ?   Resp 09/16/21 0951 12  ?   Temp 09/16/21 0951 98.8 ?F (37.1 ?C)  ?   Temp Source 09/16/21 0951 Oral  ?   SpO2 09/16/21 0945 96 %  ?   Weight 09/16/21 0957 177 lb 4 oz (80.4 kg)  ?   Height 09/16/21 0957 5' 7.99" (1.727 m)  ?   Head Circumference --   ?   Peak Flow --   ?   Pain Score 09/16/21 0956 9  ?   Pain Loc --   ?   Pain Edu? --   ?   Excl. in [place]? --   ? ? ?Most recent vital signs: ?Vitals:  ? 09/16/21 1330 09/16/21 1400  ?BP: (!) 139/58 (!) 129/50  ?Pulse: 89 93  ?Resp: 14 17  ?Temp:    ?SpO2: 100% 100%  ? ? ? ?General: Awake, no distress.  He is somewhat slow in his cognition, but oriented to being at the hospital and the year but not to situation or events of yesterday or remembering much other than the fact  that he went to dialysis today and then was sent here ?CV:  Good peripheral perfusion.  Normal heart tones.  No denoted murmurs.  Peripherally well perfused, left lower extremity is with positive capillary refill and dopplerable pulse L foot ?Resp:  Normal effort.  Clear lungs bilaterally.  99% on 2 L.  No wheezing.  No crackles or rales ?Abd:  No distention.  Soft nontender through all quadrants ?Other:  Right lower extremity with amputation.  Clean dry intact and old.  Left lower extremity with a healing ulcer at the posterior left heel, dry, no obvious purulence or surrounding erythema or evidence of abscess formation or air tracking. ? ? ?ED Results / Procedures / Treatments  ? ?Labs ?(all labs ordered are listed, but only abnormal results are displayed) ?Labs Reviewed  ?BLOOD GAS, VENOUS - Abnormal; Notable for the following components:  ?    Result Value  ? pH, Ven 7.22 (*)   ? pCO2, Ven 73 (*)   ? Bicarbonate 29.9 (*)   ? All other components within  normal limits  ?CBC - Abnormal; Notable for the following components:  ? WBC 15.2 (*)   ? RBC 3.58 (*)   ? Hemoglobin 10.4 (*)   ? HCT 36.2 (*)   ? MCV 101.1 (*)   ? MCHC 28.7 (*)   ? RDW 16.0 (*)   ? All other components within normal limits  ?COMPREHENSIVE METABOLIC PANEL - Abnormal; Notable for the following components:  ? Creatinine, Ser 5.77 (*)   ? Calcium 8.4 (*)   ? Albumin 3.2 (*)   ? GFR, Estimated 10 (*)   ? All other components within normal limits  ?BLOOD GAS, VENOUS - Abnormal; Notable for the following components:  ? pCO2, Ven 71 (*)   ? Bicarbonate 31.1 (*)   ? All other components within normal limits  ?CBG MONITORING, ED - Abnormal; Notable for the following components:  ? Glucose-Capillary 136 (*)   ? All other components within normal limits  ?TROPONIN I (HIGH SENSITIVITY) - Abnormal; Notable for the following components:  ? Troponin I (High Sensitivity) 34 (*)   ? All other components within normal limits  ?RESP PANEL BY RT-PCR (FLU A&B, COVID) [id number]  ?CULTURE, BLOOD (ROUTINE X 2)  ?CULTURE, BLOOD (ROUTINE X 2)  ?AMMONIA  ?PROCALCITONIN  ?TSH  ?LACTIC ACID, PLASMA  ?CBG MONITORING, ED  ? ? ? ?EKG ? ?Is reviewed and interpreted by me at 950 ?Heart rate 100 QRS 120 QTc 420 ?Sinus tachycardia left bundle branch block. ? ? ?RADIOLOGY ? ?Chest x-ray personally interpreted by me, hemodialysis apparatus in place.  Cardiomegaly.  Difficult to discern but potential infiltrates in the lower lung fields.  See radiology detailed report once posted ? ? ?DG Chest 2 View ? ?Result Date: 09/16/2021 ?CLINICAL DATA:  Altered mental status EXAM: CHEST - 2 VIEW COMPARISON:  Previous studies including the examination of 06/24/2020 FINDINGS: Transverse diameter of heart is increased. There are no signs of alveolar pulmonary edema. Increased markings are seen in the both lower lung fields. Lateral CP angles are indistinct. There is no pneumothorax. Vascular stents are noted in the both axillary regions right  innominate vein and left subclavian vein. There is a dialysis catheter with its tip in the right atrium. IMPRESSION: Cardiomegaly. There are no signs of alveolar pulmonary edema. Increased density in the lower lung fields, more so on the left side which may be due to pleural effusions and possibly underlying infiltrates. Electronically Signed  By: Elmer Picker M.D.   On: 09/16/2021 11:03  ? ?CT Head Wo Contrast ? ?Result Date: 09/16/2021 ?CLINICAL DATA:  Delirium EXAM: CT HEAD WITHOUT CONTRAST TECHNIQUE: Contiguous axial images were obtained from the base of the skull through the vertex without intravenous contrast. RADIATION DOSE REDUCTION: This exam was performed according to the departmental dose-optimization program which includes automated exposure control, adjustment of the mA and/or kV according to patient size and/or use of iterative reconstruction technique. COMPARISON:  Head CT May 30, 2018 FINDINGS: Brain: No evidence of acute large vascular territory infarction, hemorrhage, hydrocephalus, extra-axial collection or mass lesion/mass effect. Stable mild age related global parenchymal volume loss. Similar moderate burden of chronic ischemic white matter disease. Mineralization of bilateral basal ganglia. Vascular: No hyperdense vessel. Atherosclerotic calcifications of the internal and vertebral arteries at the skull base. Skull: Normal. Negative for fracture or focal lesion. Sinuses/Orbits: Visualized portions of the paranasal sinuses are predominantly clear. Orbits are grossly unremarkable. Other: Mastoid air cells are predominantly clear. IMPRESSION: 1. No acute intracranial abnormality. 2. Stable age-related global parenchymal volume loss and chronic ischemic white matter disease. Electronically Signed   By: Dahlia Bailiff M.D.   On: 09/16/2021 11:07  ? ?DG Foot 2 Views Left ? ?Result Date: 09/16/2021 ?CLINICAL DATA:  Altered sensorium.  Pain along posterior calcaneus. EXAM: LEFT FOOT - 2 VIEW  COMPARISON:  09/04/2021 FINDINGS: The bones are diffusely osteopenic. There are extensive vascular calcifications identified. Degenerative changes are noted at the first MTP joint. No signs of acute fracture or dislo

## 2021-09-16 NOTE — H&P (Addendum)
History and Physical  Johnny [name]. EXB:[id number] DOB: 1950/08/18 DOA: 09/16/2021  Referring physician: Dr. Fanny Bien, EDP. PCP: Hillery Aldo, MD  Outpatient Specialists: Nephrology, vascular surgery. Patient coming from: Home through HD center  Chief Complaint: Hypoxia and altered mental status at hemodialysis center.  HPI: Johnny [name]. is a 71 y.o. male with medical history significant for ESRD on HD TTS, peripheral artery disease status post right AKA on aspirin and Eliquis, recent revascularization of left lower extremity status post critical limb ischemia in April 2023, hypertension, hyperlipidemia, gout, polyneuropathy, HFpEF 70 to 75%, recent admission for left lower extremity cellulitis, who presented to [facility] ED, sent from hemodialysis center due to hypoxia with O2 saturation in the 70s.  Associated with altered mental status.  In the ED, work-up revealed concern for HCAP with bilateral lower lobe infiltrates suggestive of pneumonia, seen on chest x-ray.  WBC 15,000, procalcitonin 1.4, VBG 7.22/73, placed on BiPAP.  CT head unremarkable for any acute intracranial findings.  Patient is unable to provide a reliable history due to altered mental status.  He denies having any chest pain.  TRH, hospitalist service, was asked to admit.  ED Course: Tmax 98.8.  BP 91/74, pulse 72, respiratory 14, saturation 100% on BiPAP.  Pertinent labs as stated above.  Review of Systems: Review of systems as noted in the HPI. All other systems reviewed and are negative.   Past Medical History:  Diagnosis Date   Anemia    Anxiety    CHF (congestive heart failure) (HCC)    Chronic [name] disease    esrd dialysis m/w/f   Dialysis patient (HCC)    M, W, F   Gout    Hyperlipidemia    Hypertension    Myocardial infarction (HCC) 2010   10 years ago   Neuromuscular disorder (HCC) 2020   neuropathy in right lower extremity.   Peripheral vascular disease (HCC)    S/P BKA (below knee amputation),  right (HCC)    Wears dentures    full upper and lower   Past Surgical History:  Procedure Laterality Date   A/V FISTULAGRAM Right 09/06/2018   Procedure: A/V FISTULAGRAM;  Surgeon: Annice Needy, MD;  Location: [facility];  Service: Cardiovascular;  Laterality: Right;   A/V SHUNTOGRAM Left 06/21/2017   Procedure: A/V SHUNTOGRAM;  Surgeon: Renford Dills, MD;  Location: [facility];  Service: Cardiovascular;  Laterality: Left;   A/V SHUNTOGRAM N/A 10/24/2018   Procedure: A/V SHUNTOGRAM;  Surgeon: Annice Needy, MD;  Location: [facility];  Service: Cardiovascular;  Laterality: N/A;   ABOVE KNEE LEG AMPUTATION Right 2020   AMPUTATION Right 10/25/2018   Procedure: AMPUTATION ABOVE KNEE;  Surgeon: Annice Needy, MD;  Location: [facility];  Service: General;  Laterality: Right;   APPLICATION OF WOUND VAC Right 04/11/2018   Procedure: APPLICATION OF WOUND VAC;  Surgeon: Annice Needy, MD;  Location: [facility];  Service: Vascular;  Laterality: Right;   AV FISTULA PLACEMENT Left 09/18/2015   Procedure: INSERTION OF ARTERIOVENOUS (AV) GORE-TEX GRAFT ARM ( BRACH/AXILLARY GRAFT W/ INSTANT STICK GRAFT );  Surgeon: Renford Dills, MD;  Location: [facility];  Service: Vascular;  Laterality: Left;   AV FISTULA PLACEMENT Right 07/19/2018   Procedure: INSERTION OF ARTERIOVENOUS (AV) GORE-TEX GRAFT ARM ( BRACHIAL AXILLARY);  Surgeon: Annice Needy, MD;  Location: [facility];  Service: Vascular;  Laterality: Right;   CATARACT EXTRACTION W/PHACO Left 07/01/2021   Procedure: CATARACT  EXTRACTION PHACO AND INTRAOCULAR LENS PLACEMENT (IOC) LEFT 7.92 00:47.3;  Surgeon: Nevada Crane, MD;  Location: [facility];  Service: Ophthalmology;  Laterality: Left;   DIALYSIS FISTULA CREATION Right 10/2017   right chest perm cath   DIALYSIS/PERMA CATHETER INSERTION N/A 06/09/2020   Procedure: DIALYSIS/PERMA CATHETER INSERTION;  Surgeon: Annice Needy, MD;  Location: [facility];  Service:  Cardiovascular;  Laterality: N/A;   DIALYSIS/PERMA CATHETER INSERTION N/A 07/30/2020   Procedure: DIALYSIS/PERMA CATHETER EXCHANGE;  Surgeon: Annice Needy, MD;  Location: [facility];  Service: Cardiovascular;  Laterality: N/A;   DIALYSIS/PERMA CATHETER REMOVAL N/A 09/13/2018   Procedure: DIALYSIS/PERMA CATHETER REMOVAL;  Surgeon: Annice Needy, MD;  Location: [facility];  Service: Cardiovascular;  Laterality: N/A;   ESOPHAGOGASTRODUODENOSCOPY N/A 12/19/2017   Procedure: ESOPHAGOGASTRODUODENOSCOPY (EGD);  Surgeon: Toney Reil, MD;  Location: [facility];  Service: Gastroenterology;  Laterality: N/A;   LOWER EXTREMITY ANGIOGRAPHY Left 11/16/2017   Procedure: LOWER EXTREMITY ANGIOGRAPHY;  Surgeon: Annice Needy, MD;  Location: [facility];  Service: Cardiovascular;  Laterality: Left;   LOWER EXTREMITY ANGIOGRAPHY Right 01/18/2018   Procedure: LOWER EXTREMITY ANGIOGRAPHY;  Surgeon: Annice Needy, MD;  Location: [facility];  Service: Cardiovascular;  Laterality: Right;   LOWER EXTREMITY ANGIOGRAPHY Left 04/02/2018   Procedure: LOWER EXTREMITY ANGIOGRAPHY;  Surgeon: Annice Needy, MD;  Location: [facility];  Service: Cardiovascular;  Laterality: Left;   LOWER EXTREMITY ANGIOGRAPHY Right 04/09/2018   Procedure: Lower Extremity Angiography with possible intervention;  Surgeon: Annice Needy, MD;  Location: [facility];  Service: Cardiovascular;  Laterality: Right;   LOWER EXTREMITY ANGIOGRAPHY Right 07/23/2018   Procedure: Lower Extremity Angiography;  Surgeon: Annice Needy, MD;  Location: [facility];  Service: Cardiovascular;  Laterality: Right;   LOWER EXTREMITY ANGIOGRAPHY Right 09/13/2018   Procedure: LOWER EXTREMITY ANGIOGRAPHY;  Surgeon: Annice Needy, MD;  Location: [facility];  Service: Cardiovascular;  Laterality: Right;   LOWER EXTREMITY ANGIOGRAPHY Left 08/20/2021   Procedure: Lower Extremity Angiography;  Surgeon: Annice Needy, MD;  Location: [facility];  Service: Cardiovascular;  Laterality: Left;   LOWER EXTREMITY VENOGRAPHY Right 09/13/2018   Procedure: LOWER EXTREMITY VENOGRAPHY;  Surgeon: Annice Needy, MD;  Location: [facility];  Service: Cardiovascular;  Laterality: Right;   PERIPHERAL VASCULAR CATHETERIZATION Left 09/01/2015   Procedure: A/V Shuntogram/Fistulagram;  Surgeon: Renford Dills, MD;  Location: [facility];  Service: Cardiovascular;  Laterality: Left;   PERIPHERAL VASCULAR CATHETERIZATION N/A 09/30/2015   Procedure: A/V Shuntogram/Fistulagram with perm cathether removal;  Surgeon: Annice Needy, MD;  Location: [facility];  Service: Cardiovascular;  Laterality: N/A;   PERIPHERAL VASCULAR CATHETERIZATION Left 09/30/2015   Procedure: A/V Shunt Intervention;  Surgeon: Annice Needy, MD;  Location: [facility];  Service: Cardiovascular;  Laterality: Left;   PERIPHERAL VASCULAR CATHETERIZATION Left 12/03/2015   Procedure: Thrombectomy;  Surgeon: Annice Needy, MD;  Location: [facility];  Service: Cardiovascular;  Laterality: Left;   PERIPHERAL VASCULAR CATHETERIZATION Left 01/28/2016   Procedure: Thrombectomy;  Surgeon: Annice Needy, MD;  Location: [facility];  Service: Cardiovascular;  Laterality: Left;   PERIPHERAL VASCULAR CATHETERIZATION N/A 01/28/2016   Procedure: A/V Shuntogram/Fistulagram;  Surgeon: Annice Needy, MD;  Location: [facility];  Service: Cardiovascular;  Laterality: N/A;   SKIN SPLIT GRAFT Right  05/24/2018   Procedure: SKIN GRAFT SPLIT THICKNESS ( RIGHT CALF);  Surgeon: Annice Needy, MD;  Location: [facility];  Service: Vascular;  Laterality: Right;   UPPER EXTREMITY ANGIOGRAPHY  10/24/2018   Procedure: Upper Extremity Angiography;  Surgeon: Annice Needy, MD;  Location: [facility];  Service: Cardiovascular;;   UPPER EXTREMITY ANGIOGRAPHY Right 02/14/2019   Procedure: UPPER EXTREMITY ANGIOGRAPHY;  Surgeon: Annice Needy, MD;  Location: [facility];  Service: Cardiovascular;  Laterality: Right;   UPPER EXTREMITY ANGIOGRAPHY Right 04/20/2020   Procedure: UPPER EXTREMITY ANGIOGRAPHY;  Surgeon: Annice Needy, MD;  Location: [facility];  Service: Cardiovascular;  Laterality: Right;   WOUND DEBRIDEMENT Right 04/11/2018   Procedure: DEBRIDEMENT WOUND calf muscle and skin;  Surgeon: Annice Needy, MD;  Location: [facility];  Service: Vascular;  Laterality: Right;    Social History:  reports that he quit smoking about 16 years ago. His smoking use included cigarettes. He has never used smokeless tobacco. He reports that he does not drink alcohol and does not use drugs.   Allergies  Allergen Reactions   Shellfish Allergy Anaphylaxis   Other     Family History  Problem Relation Age of Onset   Hypertension Other    Heart disease Other    Diabetes Mother       Prior to Admission medications   Medication Sig Start Date End Date Taking? Authorizing Provider  allopurinol (ZYLOPRIM) 100 MG tablet Take 100 mg by mouth every evening.    [provider]  apixaban (ELIQUIS) 5 MG TABS tablet Take 1 tablet (5 mg total) by mouth 2 (two) times daily. 08/21/21   Lewie Chamber, MD  aspirin EC 81 MG tablet Take 1 tablet (81 mg total) by mouth daily. 04/20/20   Annice Needy, MD  atorvastatin (LIPITOR) 10 MG tablet Take 10 mg by mouth at bedtime. 11/23/17   [provider]  carvedilol (COREG) 3.125 MG tablet Take 3.125 mg by mouth 2 (two) times daily. 05/24/21   [provider]  clonazePAM (KLONOPIN) 0.5 MG tablet Take 1 tablet (0.5 mg total) by mouth as needed. 09/08/21   Arnetha Courser, MD  docusate sodium (COLACE) 100 MG capsule Take 1 capsule (100 mg total) by mouth 2 (two) times daily. 08/25/21 08/25/22  Ward, Layla Maw, DO  folic acid-vitamin b complex-vitamin c-selenium-zinc (DIALYVITE) 3 MG TABS tablet Take 1 tablet by mouth daily.     [provider]  gabapentin  (NEURONTIN) 100 MG capsule Take 2 capsules (200 mg total) by mouth 2 (two) times daily. After dialysis session Patient taking differently: Take 100 mg by mouth See admin instructions. Take 100 mg twice a day and on MON. Wed and Friday take and addition 100 mg in the evening after dialysis 06/28/20   Darlin Priestly, MD  losartan (COZAAR) 100 MG tablet Hold until followup with outpatient doctor due to intermittently low blood pressure. Patient taking differently: Take 100 mg by mouth every evening. 06/28/20   Darlin Priestly, MD  ondansetron (ZOFRAN-ODT) 4 MG disintegrating tablet Take 1 tablet (4 mg total) by mouth every 6 (six) hours as needed for nausea or vomiting. 08/25/21   Ward, Layla Maw, DO  oxyCODONE-acetaminophen (PERCOCET/ROXICET) 5-325 MG tablet Take 2 tablets by mouth every 6 (six) hours as needed. 09/08/21   Arnetha Courser, MD  polyethylene glycol (MIRALAX / GLYCOLAX) 17 g packet Take 17 g by mouth daily as needed for mild constipation. 09/08/21  Arnetha Courser, MD  sevelamer carbonate (RENVELA) 800 MG tablet Take 2,400 mg by mouth 3 (three) times daily with meals. 09/20/18   [provider]  vitamin C (ASCORBIC ACID) 500 MG tablet Take 500 mg by mouth daily.    [provider]    Physical Exam: BP (!) 129/50   Pulse 93   Temp 98.8 F (37.1 C) (Oral)   Resp 17   Ht 5' 7.99" (1.727 m)   Wt 80.4 kg   SpO2 100%   BMI 26.96 kg/m   General: 71 y.o. year-old male well developed well nourished in no acute distress.  Alert and confused. Cardiovascular: Regular rate and rhythm with no rubs or gallops.  No thyromegaly or JVD noted.  Trace lower extremity edema in LLE. Respiratory: Mild rales at bases.  No wheezing noted.  Poor inspiratory effort. Abdomen: Soft nontender nondistended with normal bowel sounds x4 quadrants. Muskuloskeletal: No cyanosis or clubbing.  Trace edema in left lower extremity.  Right AKA. Neuro: CN II-XII intact, strength, sensation, reflexes Skin: No ulcerative  lesions noted or rashes Psychiatry: Judgement and insight appear altered. Mood is appropriate for condition and setting          Labs on Admission:  Basic Metabolic Panel: Recent Labs  Lab 09/16/21 1205  NA 139  K 3.9  CL 100  CO2 27  GLUCOSE 93  BUN 15  CREATININE 5.77*  CALCIUM 8.4*   Liver Function Tests: Recent Labs  Lab 09/16/21 1205  AST 16  ALT 6  ALKPHOS 55  BILITOT 0.5  PROT 7.6  ALBUMIN 3.2*   No results for input(s): LIPASE, AMYLASE in the last 168 hours. Recent Labs  Lab 09/16/21 1021  AMMONIA 21   CBC: Recent Labs  Lab 09/16/21 0955  WBC 15.2*  HGB 10.4*  HCT 36.2*  MCV 101.1*  PLT 302   Cardiac Enzymes: No results for input(s): CKTOTAL, CKMB, CKMBINDEX, TROPONINI in the last 168 hours.  BNP (last 3 results) No results for input(s): BNP in the last 8760 hours.  ProBNP (last 3 results) No results for input(s): PROBNP in the last 8760 hours.  CBG: Recent Labs  Lab 09/16/21 0949 09/16/21 1459  GLUCAP 98 136*    Radiological Exams on Admission: DG Chest 2 View  Result Date: 09/16/2021 CLINICAL DATA:  Altered mental status EXAM: CHEST - 2 VIEW COMPARISON:  Previous studies including the examination of 06/24/2020 FINDINGS: Transverse diameter of heart is increased. There are no signs of alveolar pulmonary edema. Increased markings are seen in the both lower lung fields. Lateral CP angles are indistinct. There is no pneumothorax. Vascular stents are noted in the both axillary regions right innominate vein and left subclavian vein. There is a dialysis catheter with its tip in the right atrium. IMPRESSION: Cardiomegaly. There are no signs of alveolar pulmonary edema. Increased density in the lower lung fields, more so on the left side which may be due to pleural effusions and possibly underlying infiltrates. Electronically Signed   By: Ernie Avena M.D.   On: 09/16/2021 11:03   CT Head Wo Contrast  Result Date: 09/16/2021 CLINICAL DATA:   Delirium EXAM: CT HEAD WITHOUT CONTRAST TECHNIQUE: Contiguous axial images were obtained from the base of the skull through the vertex without intravenous contrast. RADIATION DOSE REDUCTION: This exam was performed according to the departmental dose-optimization program which includes automated exposure control, adjustment of the mA and/or kV according to patient size and/or use of iterative reconstruction technique. COMPARISON:  Head CT May 30, 2018 FINDINGS: Brain: No evidence of acute large vascular territory infarction, hemorrhage, hydrocephalus, extra-axial collection or mass lesion/mass effect. Stable mild age related global parenchymal volume loss. Similar moderate burden of chronic ischemic white matter disease. Mineralization of bilateral basal ganglia. Vascular: No hyperdense vessel. Atherosclerotic calcifications of the internal and vertebral arteries at the skull base. Skull: Normal. Negative for fracture or focal lesion. Sinuses/Orbits: Visualized portions of the paranasal sinuses are predominantly clear. Orbits are grossly unremarkable. Other: Mastoid air cells are predominantly clear. IMPRESSION: 1. No acute intracranial abnormality. 2. Stable age-related global parenchymal volume loss and chronic ischemic white matter disease. Electronically Signed   By: Maudry Mayhew M.D.   On: 09/16/2021 11:07   DG Foot 2 Views Left  Result Date: 09/16/2021 CLINICAL DATA:  Altered sensorium.  Pain along posterior calcaneus. EXAM: LEFT FOOT - 2 VIEW COMPARISON:  09/04/2021 FINDINGS: The bones are diffusely osteopenic. There are extensive vascular calcifications identified. Degenerative changes are noted at the first MTP joint. No signs of acute fracture or dislocation. No focal bone erosions identified to suggest osteomyelitis. Diffuse soft tissue swelling noted particularly along the dorsum of the forefoot. IMPRESSION: 1. No acute bone abnormality. 2. Extensive vascular calcifications. 3. Osteopenia. 4.  Soft tissue swelling. Electronically Signed   By: Signa Kell M.D.   On: 09/16/2021 11:00    EKG: I independently viewed the EKG done and my findings are as followed: Sinus tachycardia rate of 103.  Nonspecific ST-T changes.  QTc 507.  Assessment/Plan Present on Admission:  Acute respiratory failure with hypoxia and hypercarbia (HCC)  Principal Problem:   Acute respiratory failure with hypoxia and hypercarbia (HCC)  Acute hypoxic and hypercarbic respiratory failure suspect secondary to HCAP, POA Not on oxygen supplementation or CPAP at baseline. Sent from hemodialysis center due to hypoxia with O2 sat in the 70s and altered mental status. VBG 7.22/73, placed on BiPAP and tolerating well. Repeated VBG 2 hours later 7.25/71, continue BiPAP Personally reviewed chest x-ray done on admission which shows bibasilar pulmonary infiltrates. Started on cefepime and IV vancomycin in the ED for HCAP, continue. Pharmacy dosing IV vancomycin  Sepsis secondary to HCAP, POA Code sepsis called in the ED WBC 15.2 K, pulse 101. Continue empiric IV antibiotics cefepime and IV vancomycin Hold off further IV fluid in the setting of ESRD on HD. Maintain MAP greater than 65. Follow blood cultures for ID and sensitivities, narrow down antibiotics when able. Monitor fever curve and WBC  Acute metabolic encephalopathy secondary to hypercarbia  Continue to treat underlying conditions Reorient as needed Avoid sedative agents Fall and aspiration precautions  ESRD on HD TTS Nephrology consulted by EDP to resume hemodialysis sessions. Volume status and electrolytes managed with hemodialysis.  Peripheral artery disease status post right AKA Resume home aspirin, Eliquis and Lipitor.  HFpEF 70 to 75% Last 2D echo done in 2019 revealed LVEF 70 to 75% Volume status managed by hemodialysis Strict I's and O's and daily weight  Critical care time: 65 minutes.   DVT prophylaxis: Home Eliquis  Code  Status: Full code  Family Communication: None at bedside  Disposition Plan: Admitted to progressive unit  Consults called: None.  Admission status: Inpatient status.   Status is: Inpatient Patient requires at least 2 midnights for further evaluation and treatment of present condition.   Darlin Drop MD [facility] Pager [phone]  If 7PM-7AM, please contact night-coverage [email or web address] Password [facility]  09/16/2021, 4:45 PM

## 2021-09-16 NOTE — ED Notes (Signed)
Called lab to see if they can assist with 2nd BC, they are unable to send anyone. Day shift lab tech and several ED staff have tried for 2nd [facility] w/o success, Charge RN aware.  ? ?Will message attending to let them be aware of failed 2nd BC ?

## 2021-09-16 NOTE — ED Notes (Signed)
Pt appears to be sleeping, awakes to name and touch, pt reports no needs or concerns at this time. VSS, handling Bi-pap well, NAD noted, safety in place and call bell in reach for assistance. Will continue to monitor, POC on-going. ?

## 2021-09-16 NOTE — Progress Notes (Signed)
Pharmacy Antibiotic Note ? ?Johnny [name]. is a 71 y.o. male w/ PMH of ESRD on HD TTS, PAD s/p AKA, HTN, HLD, gout, CHF admitted on 09/16/2021 with pneumonia.  Pharmacy has been consulted for vancomycin dosing. He received a loading dose of 2000 mg IV vancomycin x 1 ? ?Plan: begin vancomycin 1000mg  IV Q post-HD ?Level prior to 3rd maintenance dose ?Goal 15 - 25 mcg/mL  ?MRSA PC ordered for potential narrowing of antibiotics ? ? ?Height: 5' 7.99" (172.7 cm) ?Weight: 80.4 kg (177 lb 4 oz) ?IBW/kg (Calculated) : 68.38 ? ?Temp (24hrs), Avg:98.8 ?F (37.1 ?C), Min:98.8 ?F (37.1 ?C), Max:98.8 ?F (37.1 ?C) ? ?Recent Labs  ?Lab 09/16/21 ?0955 09/16/21 ?1205 09/16/21 ?1259  ?WBC 15.2*  --   --   ?CREATININE  --  5.77*  --   ?LATICACIDVEN  --   --  1.1  ?  ?Estimated Creatinine Clearance: 11.2 mL/min (A) (by C-G formula based on SCr of 5.77 mg/dL (H)).   ? ?Allergies  ?Allergen Reactions  ? Shellfish Allergy Anaphylaxis  ? Other   ? ? ?Antimicrobials this admission: ?05/11 vancomycin >>  ?05/11 cefepime >>  ? ?Microbiology results: ?05/11 BCx: pending ?05/11 SARS CoV-2: negative  ?05/11 influenza A/B: negative ? ?Thank you for allowing pharmacy to be a part of this patient?s care. ? ?Dallie Piles ?09/16/2021 6:45 PM ? ?

## 2021-09-16 NOTE — Sepsis Progress Note (Signed)
Notified provider of need to order fluid bolus or write the reasoning to hold fluid in their progress note. ? ?

## 2021-09-16 NOTE — Consult Note (Signed)
PHARMACY -  BRIEF ANTIBIOTIC NOTE  ? ?Pharmacy has received consult(s) for cefepime and Vancomycin from an ED provider.  The patient's profile has been reviewed for ht/wt/allergies/indication/available labs.   ? ?One time order(s) placed for Vancomycin 2000mg  & Cefepime 2gm ? ?Further antibiotics/pharmacy consults should be ordered by admitting physician if indicated.       ?                ?Thank you, ?Mercy Malena Rodriguez-Guzman PharmD, BCPS ?09/16/2021 2:45 PM ? ?

## 2021-09-16 NOTE — ED Notes (Signed)
Unable to obtain 2nd set of cultures due to poor access. Lab aware and made attempt earlier but was unsuccessful. ?

## 2021-09-16 NOTE — Consult Note (Signed)
CODE SEPSIS - PHARMACY COMMUNICATION ? ?**Broad Spectrum Antibiotics should be administered within 1 hour of Sepsis diagnosis** ? ?Time Code Sepsis Called/Page Received: 1303 ? ?Antibiotics Ordered: Cefepime and Vancomycin ? ?Time of 1st antibiotic administration: 1352 ? ?Additional action taken by pharmacy: none ? ?If necessary, Name of Provider/Nurse Contacted: n/a ? ? ?Mazella Deen Rodriguez-Guzman PharmD, BCPS ?09/16/2021 2:48 PM ? ?

## 2021-09-16 NOTE — ED Notes (Signed)
Informed RN bed assigned 

## 2021-09-16 NOTE — Sepsis Progress Note (Signed)
eLink is following this Code Sepsis. °

## 2021-09-16 NOTE — ED Notes (Signed)
Report called to receiving RN, Lelon Frohlich accepted to resume pt care once pt arrived to ICU. Rt called, they will assist with BI-PAP transport. Pt to remain on cardiac monitoring. This RN to transport pt ?

## 2021-09-16 NOTE — ED Notes (Signed)
Pt appears to be sleeping, awakes to name and touch, pt reports no needs or concerns at this time. VSS, handling Bi-pap well, NAD noted, safety in place and call bell in reach for assistance. Will continue to monitor, POC on-going.  ?

## 2021-09-17 ENCOUNTER — Inpatient Hospital Stay: Payer: Medicare Other

## 2021-09-17 DIAGNOSIS — J189 Pneumonia, unspecified organism: Secondary | ICD-10-CM | POA: Diagnosis not present

## 2021-09-17 DIAGNOSIS — R6520 Severe sepsis without septic shock: Secondary | ICD-10-CM

## 2021-09-17 DIAGNOSIS — J9602 Acute respiratory failure with hypercapnia: Secondary | ICD-10-CM | POA: Diagnosis not present

## 2021-09-17 DIAGNOSIS — A419 Sepsis, unspecified organism: Secondary | ICD-10-CM | POA: Diagnosis present

## 2021-09-17 DIAGNOSIS — J9601 Acute respiratory failure with hypoxia: Secondary | ICD-10-CM | POA: Diagnosis not present

## 2021-09-17 DIAGNOSIS — G9341 Metabolic encephalopathy: Secondary | ICD-10-CM | POA: Diagnosis not present

## 2021-09-17 LAB — COMPREHENSIVE METABOLIC PANEL
ALT: 6 U/L (ref 0–44)
AST: 17 U/L (ref 15–41)
Albumin: 3.3 g/dL — ABNORMAL LOW (ref 3.5–5.0)
Alkaline Phosphatase: 59 U/L (ref 38–126)
Anion gap: 10 (ref 5–15)
BUN: 21 mg/dL (ref 8–23)
CO2: 27 mmol/L (ref 22–32)
Calcium: 8.7 mg/dL — ABNORMAL LOW (ref 8.9–10.3)
Chloride: 104 mmol/L (ref 98–111)
Creatinine, Ser: 7.39 mg/dL — ABNORMAL HIGH (ref 0.61–1.24)
GFR, Estimated: 7 mL/min — ABNORMAL LOW (ref >60)
Glucose, Bld: 78 mg/dL (ref 70–99)
Potassium: 4 mmol/L (ref 3.5–5.1)
Sodium: 141 mmol/L (ref 135–145)
Total Bilirubin: 0.3 mg/dL (ref 0.3–1.2)
Total Protein: 7.1 g/dL (ref 6.5–8.1)

## 2021-09-17 LAB — GLUCOSE, CAPILLARY
Glucose-Capillary: 109 mg/dL — ABNORMAL HIGH (ref 70–99)
Glucose-Capillary: 55 mg/dL — ABNORMAL LOW (ref 70–99)

## 2021-09-17 LAB — CBC WITH DIFFERENTIAL/PLATELET
Abs Immature Granulocytes: 0.04 10*3/uL (ref 0.00–0.07)
Basophils Absolute: 0.1 10*3/uL (ref 0.0–0.1)
Basophils Relative: 1 %
Eosinophils Absolute: 0.1 10*3/uL (ref 0.0–0.5)
Eosinophils Relative: 1 %
HCT: 32 % — ABNORMAL LOW (ref 39.0–52.0)
Hemoglobin: 9.1 g/dL — ABNORMAL LOW (ref 13.0–17.0)
Immature Granulocytes: 0 %
Lymphocytes Relative: 14 %
Lymphs Abs: 1.4 10*3/uL (ref 0.7–4.0)
MCH: 29.2 pg (ref 26.0–34.0)
MCHC: 28.4 g/dL — ABNORMAL LOW (ref 30.0–36.0)
MCV: 102.6 fL — ABNORMAL HIGH (ref 80.0–100.0)
Monocytes Absolute: 1.4 10*3/uL — ABNORMAL HIGH (ref 0.1–1.0)
Monocytes Relative: 14 %
Neutro Abs: 6.9 10*3/uL (ref 1.7–7.7)
Neutrophils Relative %: 70 %
Platelets: 248 10*3/uL (ref 150–400)
RBC: 3.12 MIL/uL — ABNORMAL LOW (ref 4.22–5.81)
RDW: 15.9 % — ABNORMAL HIGH (ref 11.5–15.5)
WBC: 10 10*3/uL (ref 4.0–10.5)
nRBC: 0 % (ref 0.0–0.2)

## 2021-09-17 LAB — MRSA NEXT GEN BY PCR, NASAL: MRSA by PCR Next Gen: NOT DETECTED

## 2021-09-17 LAB — HEPARIN LEVEL (UNFRACTIONATED): Heparin Unfractionated: >1.1 IU/mL — ABNORMAL HIGH (ref 0.30–0.70)

## 2021-09-17 LAB — BLOOD GAS, VENOUS
Acid-Base Excess: 2.4 mmol/L — ABNORMAL HIGH (ref 0.0–2.0)
Acid-base deficit: 4.2 mmol/L — ABNORMAL HIGH (ref 0.0–2.0)
Bicarbonate: 30.2 mmol/L — ABNORMAL HIGH (ref 20.0–28.0)
Bicarbonate: 23.7 mmol/L (ref 20.0–28.0)
O2 Saturation: 52.4 %
O2 Saturation: 96.3 %
Patient temperature: 37
Patient temperature: 37
pCO2, Ven: 60 mmHg (ref 44–60)
pCO2, Ven: 54 mmHg (ref 44–60)
pH, Ven: 7.31 (ref 7.25–7.43)
pH, Ven: 7.25 (ref 7.25–7.43)
pO2, Ven: 37 mmHg (ref 32–45)
pO2, Ven: 73 mmHg — ABNORMAL HIGH (ref 32–45)

## 2021-09-17 LAB — PHOSPHORUS: Phosphorus: 5 mg/dL — ABNORMAL HIGH (ref 2.5–4.6)

## 2021-09-17 LAB — LACTIC ACID, PLASMA
Lactic Acid, Venous: 1.2 mmol/L (ref 0.5–1.9)
Lactic Acid, Venous: 1.3 mmol/L (ref 0.5–1.9)

## 2021-09-17 LAB — MAGNESIUM: Magnesium: 2 mg/dL (ref 1.7–2.4)

## 2021-09-17 LAB — PROTIME-INR
INR: 1.9 — ABNORMAL HIGH (ref 0.8–1.2)
Prothrombin Time: 22 seconds — ABNORMAL HIGH (ref 11.4–15.2)

## 2021-09-17 LAB — APTT: aPTT: 45 seconds — ABNORMAL HIGH (ref 24–36)

## 2021-09-17 LAB — PROCALCITONIN: Procalcitonin: 1.57 ng/mL

## 2021-09-17 MED ORDER — APIXABAN 5 MG PO TABS: 5.0000 mg | ORAL_TABLET | Freq: Two times a day (BID) | ORAL | Status: DC

## 2021-09-17 MED ORDER — HEPARIN SODIUM (PORCINE) 5000 UNIT/ML IJ SOLN
5000.0000 [IU] | Freq: Three times a day (TID) | INTRAMUSCULAR | Status: DC
  Administered 2021-09-17: 5000 [IU] via SUBCUTANEOUS
  Filled 2021-09-17: qty 1

## 2021-09-17 MED ORDER — MIDODRINE HCL 5 MG PO TABS: 10.0000 mg | ORAL_TABLET | Freq: Three times a day (TID) | ORAL | Status: DC

## 2021-09-17 MED ORDER — ORAL CARE MOUTH RINSE
15.0000 mL | Freq: Two times a day (BID) | OROMUCOSAL | Status: DC
  Administered 2021-09-17 – 2021-09-20 (×6): 15 mL via OROMUCOSAL

## 2021-09-17 MED ORDER — HEPARIN (PORCINE) 25000 UT/250ML-% IV SOLN
800.0000 [IU]/h | INTRAVENOUS | Status: DC
  Administered 2021-09-17: 950 [IU]/h via INTRAVENOUS
  Administered 2021-09-19: 800 [IU]/h via INTRAVENOUS
  Filled 2021-09-17 (×2): qty 250

## 2021-09-17 MED ORDER — EPOETIN ALFA 10000 UNIT/ML IJ SOLN
6000.0000 [IU] | INTRAMUSCULAR | Status: DC
  Administered 2021-09-18: 6000 [IU] via INTRAVENOUS
  Filled 2021-09-17: qty 1

## 2021-09-17 MED ORDER — HEPARIN BOLUS VIA INFUSION
2000.0000 [IU] | Freq: Once | INTRAVENOUS | Status: AC
  Administered 2021-09-17: 2000 [IU] via INTRAVENOUS
  Filled 2021-09-17: qty 2000

## 2021-09-17 MED ORDER — CHLORHEXIDINE GLUCONATE CLOTH 2 % EX PADS
6.0000 | MEDICATED_PAD | Freq: Every day | CUTANEOUS | Status: DC
  Administered 2021-09-17 – 2021-09-19 (×2): 6 via TOPICAL

## 2021-09-17 MED ORDER — CHLORHEXIDINE GLUCONATE 0.12 % MT SOLN
15.0000 mL | Freq: Two times a day (BID) | OROMUCOSAL | Status: DC
  Administered 2021-09-17 – 2021-09-20 (×5): 15 mL via OROMUCOSAL
  Filled 2021-09-17 (×4): qty 15

## 2021-09-17 MED ORDER — SODIUM CHLORIDE 0.9 % IV BOLUS
250.0000 mL | Freq: Once | INTRAVENOUS | Status: AC
  Administered 2021-09-17: 250 mL via INTRAVENOUS

## 2021-09-17 MED ORDER — DEXTROSE 50 % IV SOLN
INTRAVENOUS | Status: AC
  Filled 2021-09-17: qty 50

## 2021-09-17 MED ORDER — DEXTROSE 50 % IV SOLN
25.0000 mL | Freq: Once | INTRAVENOUS | Status: AC
  Administered 2021-09-17: 25 mL via INTRAVENOUS

## 2021-09-17 MED ORDER — APIXABAN 2.5 MG PO TABS: 2.5000 mg | ORAL_TABLET | Freq: Two times a day (BID) | ORAL | Status: DC

## 2021-09-17 NOTE — Progress Notes (Addendum)
? ? ? ?Progress Note  ? ? ?Johnny [name].  [facility]U:[id number] DOB: 05/30/1950  DOA: 09/16/2021 ?PCP: Denton Lank, MD  ? ? ? ? ?Brief Narrative:  ? ? ?Medical records reviewed and are as summarized below: ? ?Johnny Nave. is a 71 y.o. male with medical history significant for ESRD on HD TTS, peripheral artery disease status post right AKA on aspirin and Eliquis, recent revascularization of left lower extremity status post critical limb ischemia in April 2023, hypertension, hyperlipidemia, gout, polyneuropathy, HFpEF 70 to 75%, recent admission for left lower extremity cellulitis.  He was sent from the outpatient hemodialysis center to the emergency room because of altered mental status and hypoxia with low oxygen saturation in the 70s. ? ?Work-up revealed severe sepsis secondary to pneumonia complicated by acute hypoxic respiratory failure and acute metabolic encephalopathy.. ? ? ? ? ?Assessment/Plan:  ? ?Principal Problem: ?  Severe sepsis ([facility]) ?Active Problems: ?  Acute respiratory failure with hypoxia and hypercarbia (HCC) ?  Acute respiratory failure with hypoxia and hypercapnia (HCC) ?  CAP (community acquired pneumonia) ?  Acute metabolic encephalopathy ? ? ? ?Body mass index is 26.28 kg/m?. ? ? ?Severe sepsis secondary to pneumonia: Continue empiric IV antibiotics.  Follow-up blood cultures. ? ?Acute hypoxic and hypercapnic respiratory failure: Continue BiPAP and wean off as able. ? ?Refractory hypotension: He was given 250 ml bolus of normal saline today. ?He has also been started on midodrine.  Intensivist was consulted to assist with management. ? ?Acute metabolic encephalopathy: Continue supportive care. ? ?ESRD: Follow-up with nephrologist for hemodialysis ? ?Stage II left buttock decubitus ulcer (present on admission): Continue local wound care ? ?Other comorbidities include chronic diastolic CHF, PVD s/p right AKA ? ?Diet Order   ? ? None  ? ?  ? ? ? ? ? ? ? ? ? ? ? ?Consultants: ?Intensivist,  nephrologist ? ?Procedures: ?None ? ? ? ?Medications:  ? ? apixaban  5 mg Oral BID  ? aspirin EC  81 mg Oral Daily  ? atorvastatin  10 mg Oral QHS  ? chlorhexidine  15 mL Mouth Rinse BID  ? Chlorhexidine Gluconate Cloth  6 each Topical Daily  ? mouth rinse  15 mL Mouth Rinse q12n4p  ? midodrine  10 mg Oral TID WC  ? sevelamer carbonate  2,400 mg Oral TID WC  ? ?Continuous Infusions: ? ceFEPime (MAXIPIME) IV 1 g (09/17/21 1345)  ? [START ON 09/18/2021] vancomycin    ? ? ? ?Anti-infectives (From admission, onward)  ? ? Start     Dose/Rate Route Frequency Ordered Stop  ? 09/18/21 1800  vancomycin (VANCOCIN) IVPB 1000 mg/200 mL premix       ? 1,000 mg ?200 mL/hr over 60 Minutes Intravenous Every T-Th-Sa (1800) 09/16/21 1854    ? 09/17/21 1300  ceFEPIme (MAXIPIME) 1 g in sodium chloride 0.9 % 100 mL IVPB       ? 1 g ?200 mL/hr over 30 Minutes Intravenous Every 24 hours 09/16/21 1644    ? 09/16/21 1645  vancomycin (VANCOREADY) IVPB 750 mg/150 mL  Status:  Discontinued       ? 750 mg ?150 mL/hr over 60 Minutes Intravenous Every 24 hours 09/16/21 1644 09/16/21 1822  ? 09/16/21 1500  vancomycin (VANCOCIN) IVPB 1000 mg/200 mL premix       ? 1,000 mg ?200 mL/hr over 60 Minutes Intravenous  Once 09/16/21 1446 09/16/21 1735  ? 09/16/21 1245  vancomycin (VANCOCIN) IVPB 1000 mg/200 mL premix       ?  1,000 mg ?200 mL/hr over 60 Minutes Intravenous  Once 09/16/21 1244 09/16/21 1504  ? 09/16/21 1245  ceFEPIme (MAXIPIME) 2 g in sodium chloride 0.9 % 100 mL IVPB       ? 2 g ?200 mL/hr over 30 Minutes Intravenous  Once 09/16/21 1244 09/16/21 1334  ? ?  ? ? ? ? ? ? ? ? ? ?Family Communication/Anticipated D/C date and plan/Code Status  ? ?DVT prophylaxis:  ?apixaban (ELIQUIS) tablet 5 mg  ? ?  Code Status: Full Code ? ?Family Communication: None ?Disposition Plan: Possible discharge to home in 3 to 4 days ? ? ?Status is: Inpatient ?Remains inpatient appropriate because: On IV antibiotics and BiPAP ? ? ? ? ? ? ?Subjective:  ? ?Interval  events noted.  Patient's blood pressure has been running low this morning.  Unable to wean off BiPAP according to [facility], Therapist, sports. ? ?Objective:  ? ? ?Vitals:  ? 09/17/21 1315 09/17/21 1322 09/17/21 1330 09/17/21 1400  ?BP:  (!) 145/65 (!) 155/68 (!) 160/83  ?Pulse: 91 91 91 91  ?Resp: 18 17 13 13   ?Temp:      ?TempSrc:      ?SpO2: 100% 100% 100% 100%  ?Weight:      ?Height:      ? ?No data found. ? ? ?Intake/Output Summary (Last 24 hours) at 09/17/2021 1453 ?Last data filed at 09/16/2021 1816 ?Gross per 24 hour  ?Intake 400.07 ml  ?Output --  ?Net 400.07 ml  ? ?Filed Weights  ? 09/16/21 0957 09/16/21 2353  ?Weight: 80.4 kg 78.4 kg  ? ? ?Exam: ? ?GEN: NAD, on BiPAP ?SKIN: Warm and dry.  Left buttock stage II decubitus ulcer ?EYES: No pallor or icterus ?ENT: MMM ?CV: RRR ?PULM: CTA B ?ABD: soft, ND, NT, +BS ?CNS: AAO x 3, non focal ?EXT: Right AKA ? ? ? ?Pressure Injury 09/17/21 Buttocks Left Stage 2 -  Partial thickness loss of dermis presenting as a shallow open injury with a red, pink wound bed without slough. (Active)  ?09/17/21 0023  ?Location: Buttocks  ?Location Orientation: Left  ?Staging: Stage 2 -  Partial thickness loss of dermis presenting as a shallow open injury with a red, pink wound bed without slough.  ?Wound Description (Comments):   ?Present on Admission: Yes  ?Dressing Type Foam - Lift dressing to assess site every shift 09/16/21 2353  ? ? ? ?Data Reviewed:  ? ?I have personally reviewed following labs and imaging studies: ? ?Labs: ?Labs show the following:  ? ?Basic Metabolic Panel: ?Recent Labs  ?Lab 09/16/21 ?1205 09/17/21 ?0768  ?NA 139 141  ?K 3.9 4.0  ?CL 100 104  ?CO2 27 27  ?GLUCOSE 93 78  ?BUN 15 21  ?CREATININE 5.77* 7.39*  ?CALCIUM 8.4* 8.7*  ?MG  --  2.0  ?PHOS  --  5.0*  ? ?GFR ?Estimated Creatinine Clearance: 8.7 mL/min (A) (by C-G formula based on SCr of 7.39 mg/dL (H)). ?Liver Function Tests: ?Recent Labs  ?Lab 09/16/21 ?1205 09/17/21 ?0881  ?AST 16 17  ?ALT 6 6  ?ALKPHOS 55 59  ?BILITOT  0.5 0.3  ?PROT 7.6 7.1  ?ALBUMIN 3.2* 3.3*  ? ?No results for input(s): LIPASE, AMYLASE in the last 168 hours. ?Recent Labs  ?Lab 09/16/21 ?1021  ?AMMONIA 21  ? ?Coagulation profile ?No results for input(s): INR, PROTIME in the last 168 hours. ? ?CBC: ?Recent Labs  ?Lab 09/16/21 ?0955 09/17/21 ?1031  ?WBC 15.2* 10.0  ?NEUTROABS  --  6.9  ?  HGB 10.4* 9.1*  ?HCT 36.2* 32.0*  ?MCV 101.1* 102.6*  ?PLT 302 248  ? ?Cardiac Enzymes: ?No results for input(s): CKTOTAL, CKMB, CKMBINDEX, TROPONINI in the last 168 hours. ?BNP (last 3 results) ?No results for input(s): PROBNP in the last 8760 hours. ?CBG: ?Recent Labs  ?Lab 09/16/21 ?1610 09/16/21 ?1459 09/16/21 ?2349  ?GLUCAP 98 136* 76  ? ?D-Dimer: ?No results for input(s): DDIMER in the last 72 hours. ?Hgb A1c: ?No results for input(s): HGBA1C in the last 72 hours. ?Lipid Profile: ?No results for input(s): CHOL, HDL, LDLCALC, TRIG, CHOLHDL, LDLDIRECT in the last 72 hours. ?Thyroid function studies: ?Recent Labs  ?  09/16/21 ?1205  ?TSH 1.000  ? ?Anemia work up: ?No results for input(s): VITAMINB12, FOLATE, FERRITIN, TIBC, IRON, RETICCTPCT in the last 72 hours. ?Sepsis Labs: ?Recent Labs  ?Lab 09/16/21 ?9604 09/16/21 ?1205 09/16/21 ?1259 09/17/21 ?5409 09/17/21 ?1004 09/17/21 ?1355  ?PROCALCITON  --  1.39  --  1.57  --   --   ?WBC 15.2*  --   --  10.0  --   --   ?LATICACIDVEN  --   --  1.1  --  1.2 1.3  ? ? ?Microbiology ?Recent Results (from the past 240 hour(s))  ?Blood culture (routine x 2)     Status: None (Preliminary result)  ? Collection Time: 09/16/21 12:59 PM  ? Specimen: BLOOD  ?Result Value Ref Range Status  ? Specimen Description BLOOD RIGHT FOREARM  Final  ? Special Requests BOTTLES DRAWN AEROBIC AND ANAEROBIC BCLV  Final  ? Culture   Final  ?  NO GROWTH < 24 HOURS ?Performed at [facility], [address] [place], [place] [address] ?  ? Report Status PENDING  Incomplete  ?Resp Panel by RT-PCR (Flu A&B, Covid) Nasopharyngeal Swab     Status: None  ?  Collection Time: 09/16/21 12:59 PM  ? Specimen: Nasopharyngeal Swab; Nasopharyngeal(NP) swabs in vial transport medium  ?Result Value Ref Range Status  ? SARS Coronavirus 2 by RT PCR NEGATIVE NEGATIVE Final  ?  Co

## 2021-09-17 NOTE — Progress Notes (Addendum)
Cross Cover ?Patient with change in mental status from early this am.  He is not focusing to name called. Not following commands. PERRL. BIPAP with acceptable tidal volumes and minute ventilation.  Oxygen sats table. Appears more edematous. ?Review of meds shows patient on SQ heparin to replace his eliquis.? ? ?VBG shows continued improvement i CO2.- pH7.25 CO2,54,  O2 73, HCO3 23.7. ?Patient did not have HD today ? ?Head CT negative for acute findings ? ?SQ hepain changed to IV until oral eliquis therapy can be resumed ? ?ADDEND - patient now with recurrent hypoglycemia, started on D10 at 30 ? ? ? ?

## 2021-09-17 NOTE — Progress Notes (Signed)
Johnny [name].  MRN: [id number]  DOB/AGE: 1951/03/24 71 y.o.  Primary Care Physician:Patel, Maralyn Sago, MD  Admit date: 09/16/2021  Chief Complaint:  Chief Complaint  Patient presents with   Altered Mental Status    Patient was at dialysis and had sudden onset confusion around 0900; EMS reported he was only oriented to self; Upon arrival here, he has a GCS of 14, oriented to self and place, but does not know why he is here and has some trouble answering questions; Only complaint is his chronic pain    S-Pt presented on  09/16/2021 with  Chief Complaint  Patient presents with   Altered Mental Status    Patient was at dialysis and had sudden onset confusion around 0900; EMS reported he was only oriented to self; Upon arrival here, he has a GCS of 14, oriented to self and place, but does not know why he is here and has some trouble answering questions; Only complaint is his chronic pain  . Patient is a 71 year old African-American male with a past medical history of end-stage renal disease on hemodialysis, peripheral vascular disease s/p right above-knee amputation, hypertension, hyperlipidemia, peripheral vascular disease who was sent to the ER yesterday with a chief complaint of altered mental status.  Patient is well-known to our practice from previous admissions.  History of present illness date back to yesterday when patient went to his regular scheduled dialysis and at the center patient was found to be confused and hypoxic EMS was called. Upon evaluation in the ER patient was found to have bilateral lower lobe infiltrates patient was admitted with acute hypoxic and hypercarbic respiratory failure and sepsis. Nephrology was consulted for comanagement of dialysis patient Patient was seen today in ICU Patient remains somnolent Patient is on BiPAP Patient does not offer any new complaints Patient's spouse was present in the room Patient history has been taken from patient's spouse,  medical records and the ICU team  Medications    apixaban  5 mg Oral BID   aspirin EC  81 mg Oral Daily   atorvastatin  10 mg Oral QHS   chlorhexidine  15 mL Mouth Rinse BID   Chlorhexidine Gluconate Cloth  6 each Topical Daily   mouth rinse  15 mL Mouth Rinse q12n4p   midodrine  10 mg Oral TID WC   sevelamer carbonate  2,400 mg Oral TID WC         ROSUnable to get any data   Physical Exam: Vital signs in last 24 hours: Temp:  [97.7 F (36.5 C)-98.7 F (37.1 C)] 98.7 F (37.1 C) (05/12 1200) Pulse Rate:  [73-99] 98 (05/12 1616) Resp:  [10-20] 14 (05/12 1616) BP: (80-170)/(36-84) 156/66 (05/12 1530) SpO2:  [90 %-100 %] 99 % (05/12 1616) FiO2 (%):  [30 %-35 %] 30 % (05/12 1530) Weight:  [78.4 kg] 78.4 kg (05/11 2353) Weight change:  Last BM Date :  (PTA)  Intake/Output from previous day: 05/11 0701 - 05/12 0700 In: 1000.1 [IV Piggyback:1000.1] Out: -  Total I/O In: 18.2 [IV Piggyback:18.2] Out: 0    Physical Exam:  General- pt is Lethargic, critically ill-appearing  HEENT head is atraumatic normocephalic BiPAP in situ  Resp-mild acute REsp distress,  Rhonchi  CVS- S1S2 regular in rate and rhythm  GIT- BS+, soft, Non tender , Non distended  EXT- No LE Edema,  No Cyanosis  Right AKA  Access- Right-sided tunneled catheter  Lab Results:  CBC  Recent Labs    09/16/21 0955  09/17/21 0437  WBC 15.2* 10.0  HGB 10.4* 9.1*  HCT 36.2* 32.0*  PLT 302 248    BMET  Recent Labs    09/16/21 1205 09/17/21 0437  NA 139 141  K 3.9 4.0  CL 100 104  CO2 27 27  GLUCOSE 93 78  BUN 15 21  CREATININE 5.77* 7.39*  CALCIUM 8.4* 8.7*      Most recent Creatinine trend  Lab Results  Component Value Date   CREATININE 7.39 (H) 09/17/2021   CREATININE 5.77 (H) 09/16/2021   CREATININE 9.50 (H) 09/08/2021      MICRO   Recent Results (from the past 240 hour(s))  Blood culture (routine x 2)     Status: None (Preliminary result)   Collection Time:  09/16/21 12:59 PM   Specimen: BLOOD  Result Value Ref Range Status   Specimen Description BLOOD RIGHT FOREARM  Final   Special Requests BOTTLES DRAWN AEROBIC AND ANAEROBIC BCLV  Final   Culture   Final    NO GROWTH < 24 HOURS Performed at [facility], [address], [place], [place] [address]    Report Status PENDING  Incomplete  Resp Panel by RT-PCR (Flu A&B, Covid) Nasopharyngeal Swab     Status: None   Collection Time: 09/16/21 12:59 PM   Specimen: Nasopharyngeal Swab; Nasopharyngeal(NP) swabs in vial transport medium  Result Value Ref Range Status   SARS Coronavirus 2 by RT PCR NEGATIVE NEGATIVE Final    Comment: (NOTE) SARS-CoV-2 target nucleic acids are NOT DETECTED.  The SARS-CoV-2 RNA is generally detectable in upper respiratory specimens during the acute phase of infection. The lowest concentration of SARS-CoV-2 viral copies this assay can detect is 138 copies/mL. A negative result does not preclude SARS-Cov-2 infection and should not be used as the sole basis for treatment or other patient management decisions. A negative result may occur with  improper specimen collection/handling, submission of specimen other than nasopharyngeal swab, presence of viral mutation(s) within the areas targeted by this assay, and inadequate number of viral copies(<138 copies/mL). A negative result must be combined with clinical observations, patient history, and epidemiological information. The expected result is Negative.  Fact Sheet for Patients:  BloggerCourse.com  Fact Sheet for Healthcare Providers:  SeriousBroker.it  This test is no t yet approved or cleared by the [place] FDA and  has been authorized for detection and/or diagnosis of SARS-CoV-2 by FDA under an Emergency Use Authorization (EUA). This EUA will remain  in effect (meaning this test can be used) for the duration of the COVID-19 declaration under  Section 564(b)(1) of the Act, 21 U.S.C.section 360bbb-3(b)(1), unless the authorization is terminated  or revoked sooner.       Influenza A by PCR NEGATIVE NEGATIVE Final   Influenza B by PCR NEGATIVE NEGATIVE Final    Comment: (NOTE) The Xpert Xpress SARS-CoV-2/FLU/RSV plus assay is intended as an aid in the diagnosis of influenza from Nasopharyngeal swab specimens and should not be used as a sole basis for treatment. Nasal washings and aspirates are unacceptable for Xpert Xpress SARS-CoV-2/FLU/RSV testing.  Fact Sheet for Patients: BloggerCourse.com  Fact Sheet for Healthcare Providers: SeriousBroker.it  This test is not yet approved or cleared by the [place] FDA and has been authorized for detection and/or diagnosis of SARS-CoV-2 by FDA under an Emergency Use Authorization (EUA). This EUA will remain in effect (meaning this test can be used) for the duration of the COVID-19 declaration under Section 564(b)(1) of the Act, 21  U.S.C. section 360bbb-3(b)(1), unless the authorization is terminated or revoked.  Performed at [facility], [address] [place], [place] [address]   MRSA Next Gen by PCR, Nasal     Status: None   Collection Time: 09/17/21 12:38 AM   Specimen: Nasal Mucosa; Nasal Swab  Result Value Ref Range Status   MRSA by PCR Next Gen NOT DETECTED NOT DETECTED Final    Comment: (NOTE) The GeneXpert MRSA Assay (FDA approved for NASAL specimens only), is one component of a comprehensive MRSA colonization surveillance program. It is not intended to diagnose MRSA infection nor to guide or monitor treatment for MRSA infections. Test performance is not FDA approved in patients less than 39 years old. Performed at [name], [address] [place], [place] [address]          Impression:   1)Renal    End-stage renal disease Patient is on hemodialysis Patient is on Monday  Wednesday Friday schedule Patient could not be dialyzed on Friday secondary to transportation issues Patient was then dialyzed on Thursday  No need for Hd today    2)Hypotension Patient is currently on midodrine   3)Anemia of chronic disease     Latest Ref Rng & Units 09/17/2021    4:37 AM 09/16/2021    9:55 AM 09/05/2021    1:25 PM  CBC  WBC 4.0 - 10.5 K/uL 10.0   15.2   11.4    Hemoglobin 13.0 - 17.0 g/dL 9.1   86.5   78.4    Hematocrit 39.0 - 52.0 % 32.0   36.2   34.7    Platelets 150 - 400 K/uL 248   302   206         HGb Is just at at goal (9--11) We will increase the Epogen dose  4) Secondary hyperparathyroidism -CKD Mineral-Bone Disorder    Lab Results  Component Value Date   PTH 354 (H) 09/07/2021   CALCIUM 8.7 (L) 09/17/2021   CAION 0.98 (L) 07/01/2021   PHOS 5.0 (H) 09/17/2021    Secondary Hyperparathyroidism Present.  Phosphorus at goal.   5)Sepsis On broad spectrum Clinically stable   6) Electrolytes      Latest Ref Rng & Units 09/17/2021    4:37 AM 09/16/2021   12:05 PM 09/08/2021    4:27 AM  BMP  Glucose 70 - 99 mg/dL 78   93   71    BUN 8 - 23 mg/dL 21   15   31     Creatinine 0.61 - 1.24 mg/dL 6.96   2.95   2.84    Sodium 135 - 145 mmol/L 141   139   137    Potassium 3.5 - 5.1 mmol/L 4.0   3.9   4.9    Chloride 98 - 111 mmol/L 104   100   96    CO2 22 - 32 mmol/L 27   27   26     Calcium 8.9 - 10.3 mg/dL 8.7   8.4   8.5       Sodium Normonatremic   Potassium Normokalemic    7)Acid base    Co2 at goal  8)Acute hypoxic and hypercapnic respiratory failure Patient is currently on BiPAP   Plan:   NO need for Hd today Will dialyze in am Will keep on epo       Nathian Stencil s Tylisa Alcivar 09/17/2021, 4:19 PM

## 2021-09-17 NOTE — Plan of Care (Signed)
Continuing with plan of care. 

## 2021-09-17 NOTE — Consult Note (Signed)
? ?NAME:  Johnny Demir., MRN:  [id number], DOB:  11-26-1950, LOS: 1 ?ADMISSION DATE:  09/16/2021, CONSULTATION DATE:  09/17/2021 ?REFERRING MD:  Dr. Mal Misty, CHIEF COMPLAINT:  AMS, Acute Hypoxic Respiratory Failure  ? ?Brief Pt Description / Synopsis:  ?71 year old with past medical history significant for ESRD on HD, PAD status post right AKA on aspirin and Eliquis, HFpEF admitted with acute metabolic encephalopathy, severe sepsis and acute hypoxic respiratory failure in the setting of suspected healthcare associated pneumonia requiring BiPAP. ? ?History of Present Illness:  ?Johnny [name] is a 71 year old male with a past medical history significant for ESRD on HD (Tuesday, Thursday, Saturday), peripheral artery disease status post right BKA on aspirin and Eliquis, recent revascularization of the left lower extremity in April 2023, hypertension, hyperlipidemia, gout, HFpEF, polyneuropathy who was sent to [facility] ED on 09/16/2021 from his outpatient hemodialysis center due to hypoxia with O2 saturations in the 70s and altered mental status. ? ?Patient is currently lethargic on BiPAP and not a great historian, therefore history is obtained from chart review.  Per notes at his outpatient dialysis he had sudden onset confusion being oriented only to self, along with reported hypoxia. ? ?Of note he was recently admitted from 09/04/21 - 09/08/21 for concern for left lower extremity cellulitis.  He was seen by podiatry and vascular surgery.  Required discharge to skilled nursing facility. ? ?ED Course: ?Initial Vital Signs: Temperature 98.8 ?F, blood pressure 91/74, pulse 72, respiratory rate 14, SPO2 100% on BiPAP ?Significant Labs: BUN 15, creatinine 5.77, high-sensitivity troponin 34, lactic acid 1.1, procalcitonin 1.39, WBC 15.2, hemoglobin 10.4 ?VBG: pH 7.22/PCO2 73/PO2 41/bicarbonate 29.9 ?SARS-CoV-2 and influenza PCR>> negative ?Imaging ?Chest X-ray>>IMPRESSION: ?Cardiomegaly. There are no signs of alveolar pulmonary  edema. ?Increased density in the lower lung fields, more so on the left side ?which may be due to pleural effusions and possibly underlying ?infiltrates. ?X-ray left foot>>IMPRESSION: ?1. No acute bone abnormality. ?2. Extensive vascular calcifications. ?3. Osteopenia. ?4. Soft tissue swelling ?CT head without contrast>>IMPRESSION: ?1. No acute intracranial abnormality. ?2. Stable age-related global parenchymal volume loss and chronic ?ischemic white matter disease. ?Medications Administered: Vancomycin, cefepime, 500 cc normal saline bolus ? ?Hospitalist were asked to admit. ? ?Please see "significant hospital events" section below for full detailed hospital course. ? ?Pertinent  Medical History  ? ?Past Medical History:  ?Diagnosis Date  ? Anemia   ? Anxiety   ? CHF (congestive heart failure) ([place])   ? Chronic kidney disease   ? esrd dialysis m/w/f  ? Dialysis patient [facility])   ? M, W, F  ? Gout   ? Hyperlipidemia   ? Hypertension   ? Myocardial infarction [facility]) 2010  ? 10 years ago  ? Neuromuscular disorder ([place]) 2020  ? 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  ? Peripheral vascular disease ([place])   ? S/P BKA (below knee amputation), right ([place])   ? Wears dentures   ? full upper and lower  ? ? ?Micro Data:  ?5/11: SARS-CoV-2 and influenza PCR>> negative ?5/11: Blood culture>> ?5/12: MRSA PCR>> negative ? ?Antimicrobials:  ?Cefepime 5/11>> ?Vancomycin 5/11>> ? ?Significant Hospital Events: ?Including procedures, antibiotic start and stop dates in addition to other pertinent events   ?5/11: Admitted for acute hypoxic respiratory failure and sepsis requiring BiPAP. ?5/12: Blood pressure soft, PCCM consulted for potential vasopressors ? ?Interim History / Subjective:  ?-This morning patient blood pressure is soft, unclear of reliability of cuff pressures due to severe peripheral artery disease ?-Patient received only 500  cc of fluid resuscitation in the ED, breath sounds currently clear ?-Will challenge with small 250 cc  normal saline bolus and check lactic ?-Was transitioned off BiPAP but having periods of apnea and was placed back on BiPAP currently lethargic but arouses easily to voice and oriented x3, no focal neurodeficits ? ?Objective   ?Blood pressure (!) 96/50, pulse 85, temperature 97.7 ?F (36.5 ?C), temperature source Axillary, resp. rate 14, height 5\' 8"  (1.727 m), weight 78.4 kg, SpO2 98 %. ?   ?FiO2 (%):  [30 %-40 %] 30 %  ? ?Intake/Output Summary (Last 24 hours) at 09/17/2021 0857 ?Last data filed at 09/16/2021 1816 ?Gross per 24 hour  ?Intake 1000.07 ml  ?Output --  ?Net 1000.07 ml  ? ?Filed Weights  ? 09/16/21 0957 09/16/21 2353  ?Weight: 80.4 kg 78.4 kg  ? ? ?Examination: ?General: Acute on chronically ill-appearing male, laying in bed, on BiPAP, no acute distress ?HENT: Atraumatic, normocephalic, neck supple, no JVD, dry mucous membranes ?Lungs: Clear breath sounds bilaterally, diminished in the bases, BiPAP assisted, even, nonlabored ?Cardiovascular: Regular rate and rhythm, S1-S2, no murmurs, rubs, gallop ?Abdomen: Soft, nontender, nondistended, no guarding rebound tenderness, bowel sounds positive x4 ?Extremities: Right BKA clean dry and intact ?Neuro: Lethargic, arouses easily to voice, oriented to person, place, time, follows commands, no focal deficits, pupils PERRLA ?GU: Deferred ? ?Resolved Hospital Problem list   ? ? ?Assessment & Plan:  ? ?Hypotension: Sepsis +/- hypovolemia ?Chronic HFpEF, not acutely decompensated ?Mildly elevated troponin, suspect demand ischemia ?Severe peripheral artery disease status post right AKA ?PMHx: Hypertension ?-Continuous cardiac monitoring ?-Maintain MAP >65 ?-Cautious IV fluids ~received 500 cc bolus in ED, will give small fluid challenge with 250 cc bolus and assess for responsiveness ?-Vasopressors as needed to maintain MAP goal ?-Trend lactic acid until normalized ?-Trend HS Troponin until peaked ?-Echocardiogram 01/22/2018: LVEF 70 to 73%, grade 1 diastolic dysfunction,  RV systolic function normal ?-Continue home Eliquis, ASA, and statin ~may need to consider transition to heparin drip ?-Consider repeat echo ? ?Severe sepsis in setting of suspected HCAP ?PMHx: Recent left lower extremity cellulitis ?-Monitor fever curve ?-Trend WBC's & Procalcitonin ?-Follow cultures as above ?-Continue empiric cefepime and vancomycin pending cultures & sensitivities ?-No evidence of cellulitis on physical exam, imaging of left foot negative for osteomyelitis ? ?Acute hypoxic &hypercapnic respiratory failure in the setting of suspected HCAP ?-Supplemental O2 as needed to maintain O2 sats >90% ?-BiPAP, wean as tolerated ?-Follow intermittent Chest X-ray & ABG as needed ?-ABX as above ?-Volume removal with dialysis ?-Pulmonary toilet as able ? ?Acute metabolic encephalopathy in the setting of CO2 narcosis and sepsis ?-Treat metabolic processes as outlined above ?-Provide supportive care ?-Avoid sedating medications as able ?-CT head negative on admission ?-Considered obtaining urine drug screen if able to make urine ? ?ESRD on hemodialysis ?-Monitor I&O's / urinary output ?-Follow BMP ?-Ensure adequate renal perfusion ?-Avoid nephrotoxic agents as able ?-Replace electrolytes as indicated ?-Nephrology consulted, appreciate input ?-HD as per nephrology ? ? ? ? ? ? ? ?Best Practice (right click and "Reselect all SmartList Selections" daily)  ? ?Diet/type: NPO ?DVT prophylaxis: DOAC ?GI prophylaxis: N/A ?Lines: N/A ?Foley:  N/A ?Code Status:  full code ?Last date of multidisciplinary goals of care discussion [5/12] ? ?Labs   ?CBC: ?Recent Labs  ?Lab 09/16/21 ?0955 09/17/21 ?5329  ?WBC 15.2* 10.0  ?NEUTROABS  --  6.9  ?HGB 10.4* 9.1*  ?HCT 36.2* 32.0*  ?MCV 101.1* 102.6*  ?PLT 302 248  ? ? ?  Basic Metabolic Panel: ?Recent Labs  ?Lab 09/16/21 ?1205 09/17/21 ?2217  ?NA 139 141  ?K 3.9 4.0  ?CL 100 104  ?CO2 27 27  ?GLUCOSE 93 78  ?BUN 15 21  ?CREATININE 5.77* 7.39*  ?CALCIUM 8.4* 8.7*  ?MG  --  2.0  ?PHOS   --  5.0*  ? ?GFR: ?Estimated Creatinine Clearance: 8.7 mL/min (A) (by C-G formula based on SCr of 7.39 mg/dL (H)). ?Recent Labs  ?Lab 09/16/21 ?9810 09/16/21 ?1205 09/16/21 ?1259 09/17/21 ?2548  ?PROCALCIT

## 2021-09-17 NOTE — Progress Notes (Signed)
Transported pt to CT and back to ICU on bipap with no events  ?

## 2021-09-17 NOTE — Progress Notes (Signed)
Patient has had an eventful day, initially patient's MAP readings were in the high 50s to upper 60s, Dr. Mal Misty notified and consult for intensivist was placed.  NP ordered 265mL NS bolus, which the patient tolerated well and MAP readings are within normal range; order to have lactic acid drawn as well was placed.  Respiratory attempted to take patient off of BIPAP, the patient's oxygen saturations went down to the mid 80s and respiratory placed BIPAP back on.  Later in the shift patient's B/Ps were intermittently elevated, scheduled midodrine had previously been ordered, NP notified and desires to leave scheduled midodrine at this time.  Patient is NPO and continues on BIPAP, patient has ordered medications that are via po route, Dr. Mal Misty notified this morning as patient is unable to take po medications while utilizing BIPAP.  Patient has declined to be repositioned throughout the shift, NP notified of refusals.  Will continue to monitor patient. ?

## 2021-09-17 NOTE — Consult Note (Signed)
ANTICOAGULATION CONSULT NOTE  ? ?Pharmacy Consult for heparin ?Indication:  PVD previously on apixaban ? ?Allergies  ?Allergen Reactions  ? Shellfish Allergy Anaphylaxis  ? Other   ? ? ?Patient Measurements: ?Height: 5\' 8"  (172.7 cm) ?Weight: 78.4 kg (172 lb 13.5 oz) ?IBW/kg (Calculated) : 68.4 ?Heparin Dosing Weight: 78.4 kg ? ?Vital Signs: ?Temp: 99 ?F (37.2 ?C) (05/12 2000) ?Temp Source: Axillary (05/12 2000) ?BP: 127/68 (05/12 2200) ?Pulse Rate: 105 (05/12 2130) ? ?Labs: ?Recent Labs  ?  09/16/21 ?0258 09/16/21 ?1205 09/17/21 ?5277  ?HGB 10.4*  --  9.1*  ?HCT 36.2*  --  32.0*  ?PLT 302  --  248  ?CREATININE  --  5.77* 7.39*  ?TROPONINIHS  --  37*  --   ? ? ?Estimated Creatinine Clearance: 8.7 mL/min (A) (by C-G formula based on SCr of 7.39 mg/dL (H)). ? ? ?Medical History: ?Past Medical History:  ?Diagnosis Date  ? Anemia   ? Anxiety   ? CHF (congestive heart failure) ([place])   ? Chronic kidney disease   ? esrd dialysis m/w/f  ? Dialysis patient [facility])   ? M, W, F  ? Gout   ? Hyperlipidemia   ? Hypertension   ? Myocardial infarction [facility]) 2010  ? 10 years ago  ? Neuromuscular disorder ([place]) 2020  ? 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  ? Peripheral vascular disease ([place])   ? S/P BKA (below knee amputation), right ([place])   ? Wears dentures   ? full upper and lower  ? ? ?Medications:  ?Medications Prior to Admission  ?Medication Sig Dispense Refill Last Dose  ? allopurinol (ZYLOPRIM) 100 MG tablet Take 100 mg by mouth every evening.   09/15/2021 at 2000  ? apixaban (ELIQUIS) 5 MG TABS tablet Take 1 tablet (5 mg total) by mouth 2 (two) times daily. 60 tablet 5 09/15/2021 at 2000  ? aspirin EC 81 MG tablet Take 1 tablet (81 mg total) by mouth daily. 150 tablet 2 09/15/2021  ? atorvastatin (LIPITOR) 10 MG tablet Take 10 mg by mouth at bedtime.   09/15/2021 at 2000  ? carvedilol (COREG) 3.125 MG tablet Take 3.125 mg by mouth 2 (two) times daily.   09/15/2021 at 2000  ? docusate sodium (COLACE) 100 MG capsule Take 1 capsule  (100 mg total) by mouth 2 (two) times daily. 60 capsule 0 09/15/2021 at 2000  ? gabapentin (NEURONTIN) 100 MG capsule Take 2 capsules (200 mg total) by mouth 2 (two) times daily. After dialysis session (Patient taking differently: Take 100 mg by mouth See admin instructions. Take 100 mg twice a day and on MON. Wed and Friday take and addition 100 mg in the evening after dialysis)   09/15/2021 at 2000  ? losartan (COZAAR) 100 MG tablet Hold until followup with outpatient doctor due to intermittently low blood pressure. (Patient taking differently: Take 100 mg by mouth every evening.)   09/15/2021 at 2000  ? oxyCODONE-acetaminophen (PERCOCET/ROXICET) 5-325 MG tablet Take 2 tablets by mouth every 6 (six) hours as needed. 20 tablet 0 09/15/2021 at 2000  ? sevelamer carbonate (RENVELA) 800 MG tablet Take 2,400 mg by mouth 3 (three) times daily with meals.   09/15/2021  ? vitamin C (ASCORBIC ACID) 500 MG tablet Take 500 mg by mouth daily.   09/15/2021  ? clonazePAM (KLONOPIN) 0.5 MG tablet Take 1 tablet (0.5 mg total) by mouth as needed. 10 tablet 0 prn  ? folic acid-vitamin b complex-vitamin c-selenium-zinc (DIALYVITE) 3 MG TABS tablet Take  1 tablet by mouth daily.      ? ondansetron (ZOFRAN-ODT) 4 MG disintegrating tablet Take 1 tablet (4 mg total) by mouth every 6 (six) hours as needed for nausea or vomiting. 20 tablet 0 prn  ? polyethylene glycol (MIRALAX / GLYCOLAX) 17 g packet Take 17 g by mouth daily as needed for mild constipation. 14 each 0 prn  ? ?Scheduled:  ? aspirin EC  81 mg Oral Daily  ? atorvastatin  10 mg Oral QHS  ? chlorhexidine  15 mL Mouth Rinse BID  ? Chlorhexidine Gluconate Cloth  6 each Topical Daily  ? [START ON 09/18/2021] Chlorhexidine Gluconate Cloth  6 each Topical Q0600  ? [START ON 09/18/2021] epoetin (EPOGEN/PROCRIT) injection  6,000 Units Intravenous Q T,Th,Sa-HD  ? heparin injection (subcutaneous)  5,000 Units Subcutaneous Q8H  ? mouth rinse  15 mL Mouth Rinse q12n4p  ? sevelamer carbonate  2,400  mg Oral TID WC  ? ?Infusions:  ? ceFEPime (MAXIPIME) IV Stopped (09/17/21 1424)  ? [START ON 09/18/2021] vancomycin    ? ?PRN: acetaminophen, oxyCODONE-acetaminophen, polyethylene glycol, prochlorperazine ?Anti-infectives (From admission, onward)  ? ? Start     Dose/Rate Route Frequency Ordered Stop  ? 09/18/21 1800  vancomycin (VANCOCIN) IVPB 1000 mg/200 mL premix       ? 1,000 mg ?200 mL/hr over 60 Minutes Intravenous Every T-Th-Sa (1800) 09/16/21 1854    ? 09/17/21 1300  ceFEPIme (MAXIPIME) 1 g in sodium chloride 0.9 % 100 mL IVPB       ? 1 g ?200 mL/hr over 30 Minutes Intravenous Every 24 hours 09/16/21 1644    ? 09/16/21 1645  vancomycin (VANCOREADY) IVPB 750 mg/150 mL  Status:  Discontinued       ? 750 mg ?150 mL/hr over 60 Minutes Intravenous Every 24 hours 09/16/21 1644 09/16/21 1822  ? 09/16/21 1500  vancomycin (VANCOCIN) IVPB 1000 mg/200 mL premix       ? 1,000 mg ?200 mL/hr over 60 Minutes Intravenous  Once 09/16/21 1446 09/16/21 1735  ? 09/16/21 1245  vancomycin (VANCOCIN) IVPB 1000 mg/200 mL premix       ? 1,000 mg ?200 mL/hr over 60 Minutes Intravenous  Once 09/16/21 1244 09/16/21 1504  ? 09/16/21 1245  ceFEPIme (MAXIPIME) 2 g in sodium chloride 0.9 % 100 mL IVPB       ? 2 g ?200 mL/hr over 30 Minutes Intravenous  Once 09/16/21 1244 09/16/21 1334  ? ?  ? ? ?Assessment: ?Pharmacy consulted to start heparin while holding apixaban. Last dose apixaban 5/11 PM. Will use aPTT to monitor heparin as apixaban can falsely elevate heparin levels.  ? ?Goal of Therapy:  ?Heparin level 0.3-0.7 units/ml ?Monitor platelets by anticoagulation protocol: Yes ?  ?Plan:  ?Give 2000 units bolus x 1 ?Start heparin infusion at 950 units/hr ?Check anti-Xa level in 8 hours and daily while on heparin ?Continue to monitor H&H and platelets ? ?Oswald Hillock, PharmD, BCPS ?09/17/2021,10:36 PM ? ? ? ?

## 2021-09-18 DIAGNOSIS — G9341 Metabolic encephalopathy: Secondary | ICD-10-CM | POA: Diagnosis not present

## 2021-09-18 DIAGNOSIS — J9602 Acute respiratory failure with hypercapnia: Secondary | ICD-10-CM | POA: Diagnosis not present

## 2021-09-18 DIAGNOSIS — J9601 Acute respiratory failure with hypoxia: Secondary | ICD-10-CM | POA: Diagnosis not present

## 2021-09-18 DIAGNOSIS — J189 Pneumonia, unspecified organism: Secondary | ICD-10-CM | POA: Diagnosis not present

## 2021-09-18 DIAGNOSIS — A419 Sepsis, unspecified organism: Secondary | ICD-10-CM | POA: Diagnosis not present

## 2021-09-18 LAB — CBC
HCT: 31.4 % — ABNORMAL LOW (ref 39.0–52.0)
Hemoglobin: 8.9 g/dL — ABNORMAL LOW (ref 13.0–17.0)
MCH: 28.9 pg (ref 26.0–34.0)
MCHC: 28.3 g/dL — ABNORMAL LOW (ref 30.0–36.0)
MCV: 101.9 fL — ABNORMAL HIGH (ref 80.0–100.0)
Platelets: 251 10*3/uL (ref 150–400)
RBC: 3.08 MIL/uL — ABNORMAL LOW (ref 4.22–5.81)
RDW: 15.7 % — ABNORMAL HIGH (ref 11.5–15.5)
WBC: 7.8 10*3/uL (ref 4.0–10.5)
nRBC: 0 % (ref 0.0–0.2)

## 2021-09-18 LAB — BASIC METABOLIC PANEL
Anion gap: 12 (ref 5–15)
BUN: 35 mg/dL — ABNORMAL HIGH (ref 8–23)
CO2: 25 mmol/L (ref 22–32)
Calcium: 9 mg/dL (ref 8.9–10.3)
Chloride: 104 mmol/L (ref 98–111)
Creatinine, Ser: 10.11 mg/dL — ABNORMAL HIGH (ref 0.61–1.24)
GFR, Estimated: 5 mL/min — ABNORMAL LOW (ref >60)
Glucose, Bld: 95 mg/dL (ref 70–99)
Potassium: 4.2 mmol/L (ref 3.5–5.1)
Sodium: 141 mmol/L (ref 135–145)

## 2021-09-18 LAB — GLUCOSE, CAPILLARY
Glucose-Capillary: 106 mg/dL — ABNORMAL HIGH (ref 70–99)
Glucose-Capillary: 107 mg/dL — ABNORMAL HIGH (ref 70–99)
Glucose-Capillary: 112 mg/dL — ABNORMAL HIGH (ref 70–99)
Glucose-Capillary: 81 mg/dL (ref 70–99)
Glucose-Capillary: 83 mg/dL (ref 70–99)
Glucose-Capillary: 88 mg/dL (ref 70–99)

## 2021-09-18 LAB — MAGNESIUM: Magnesium: 2.2 mg/dL (ref 1.7–2.4)

## 2021-09-18 LAB — APTT
aPTT: 123 seconds — ABNORMAL HIGH (ref 24–36)
aPTT: 98 seconds — ABNORMAL HIGH (ref 24–36)

## 2021-09-18 LAB — VITAMIN B12: Vitamin B-12: 565 pg/mL (ref 180–914)

## 2021-09-18 LAB — AMMONIA: Ammonia: 20 umol/L (ref 9–35)

## 2021-09-18 LAB — PHOSPHORUS: Phosphorus: 7.1 mg/dL — ABNORMAL HIGH (ref 2.5–4.6)

## 2021-09-18 LAB — HEPATITIS B CORE ANTIBODY, IGM: Hep B C IgM: NONREACTIVE

## 2021-09-18 LAB — HEPARIN LEVEL (UNFRACTIONATED): Heparin Unfractionated: >1.1 IU/mL — ABNORMAL HIGH (ref 0.30–0.70)

## 2021-09-18 LAB — HEPATITIS B SURFACE ANTIGEN: Hepatitis B Surface Ag: NONREACTIVE

## 2021-09-18 MED ORDER — LORAZEPAM 2 MG/ML IJ SOLN
0.2500 mg | Freq: Three times a day (TID) | INTRAMUSCULAR | Status: DC
  Administered 2021-09-18 – 2021-09-19 (×3): 0.25 mg via INTRAVENOUS
  Filled 2021-09-18 (×3): qty 1

## 2021-09-18 MED ORDER — HEPARIN SODIUM (PORCINE) 1000 UNIT/ML DIALYSIS: 20.0000 [IU]/kg | INTRAMUSCULAR | Status: DC | PRN

## 2021-09-18 MED ORDER — SODIUM CHLORIDE 0.9 % IV SOLN
250.0000 mg | INTRAVENOUS | Status: DC
  Administered 2021-09-18 – 2021-09-20 (×3): 250 mg via INTRAVENOUS
  Filled 2021-09-18 (×3): qty 2.5

## 2021-09-18 MED ORDER — CYANOCOBALAMIN 1000 MCG/ML IJ SOLN
1000.0000 ug | Freq: Once | INTRAMUSCULAR | Status: AC
  Administered 2021-09-18: 1000 ug via INTRAMUSCULAR
  Filled 2021-09-18: qty 1

## 2021-09-18 MED ORDER — HEPARIN SODIUM (PORCINE) 1000 UNIT/ML IJ SOLN
INTRAMUSCULAR | Status: AC
Start: 2021-09-18 — End: 2021-09-18
  Filled 2021-09-18: qty 10

## 2021-09-18 MED ORDER — LIDOCAINE HCL (PF) 1 % IJ SOLN
5.0000 mL | INTRAMUSCULAR | Status: DC | PRN
  Filled 2021-09-18: qty 5

## 2021-09-18 MED ORDER — HEPARIN SODIUM (PORCINE) 1000 UNIT/ML DIALYSIS
1000.0000 [IU] | INTRAMUSCULAR | Status: DC | PRN
  Administered 2021-09-18: 3600 [IU] via INTRAVENOUS_CENTRAL

## 2021-09-18 MED ORDER — SODIUM CHLORIDE 0.9 % IV SOLN: 100.0000 mL | INTRAVENOUS | Status: DC | PRN

## 2021-09-18 MED ORDER — PENTAFLUOROPROP-TETRAFLUOROETH EX AERO
1.0000 "application " | INHALATION_SPRAY | CUTANEOUS | Status: DC | PRN
  Filled 2021-09-18: qty 30

## 2021-09-18 MED ORDER — LIDOCAINE-PRILOCAINE 2.5-2.5 % EX CREA
1.0000 "application " | TOPICAL_CREAM | CUTANEOUS | Status: DC | PRN
  Filled 2021-09-18: qty 5

## 2021-09-18 MED ORDER — DEXTROSE 10 % IV SOLN: INTRAVENOUS | Status: DC

## 2021-09-18 MED ORDER — ALTEPLASE 2 MG IJ SOLR
2.0000 mg | Freq: Once | INTRAMUSCULAR | Status: DC | PRN
  Filled 2021-09-18: qty 2

## 2021-09-18 MED ORDER — LEVETIRACETAM IN NACL 500 MG/100ML IV SOLN: 500.0000 mg | Freq: Two times a day (BID) | INTRAVENOUS | Status: DC

## 2021-09-18 NOTE — Progress Notes (Signed)
Johnny [name].  MRN: [id number]  DOB/AGE: 11-21-1950 71 y.o.  Primary Care Physician:Patel, Maralyn Sago, MD  Admit date: 09/16/2021  Chief Complaint:  Chief Complaint  Patient presents with   Altered Mental Status    Patient was at dialysis and had sudden onset confusion around 0900; EMS reported he was only oriented to self; Upon arrival here, he has a GCS of 14, oriented to self and place, but does not know why he is here and has some trouble answering questions; Only complaint is his chronic pain    S-Pt presented on  09/16/2021 with  Chief Complaint  Patient presents with   Altered Mental Status    Patient was at dialysis and had sudden onset confusion around 0900; EMS reported he was only oriented to self; Upon arrival here, he has a GCS of 14, oriented to self and place, but does not know why he is here and has some trouble answering questions; Only complaint is his chronic pain  . Patient is a 71 year old African-American male with a past medical history of end-stage renal disease on hemodialysis, peripheral vascular disease s/p right above-knee amputation, hypertension, hyperlipidemia, peripheral vascular disease who was sent to the ER yesterday with a chief complaint of altered mental status.  Patient is well-known to our practice from previous admissions.  History of present illness date back to yesterday when patient went to his regular scheduled dialysis and at the center patient was found to be confused and hypoxic EMS was called. Nephrology was consulted for comanagement of dialysis patient    Patient was seen today in ICU Patient remains somnolent Patient is on BiPAP Patient does not offer any new complaints   Medications    aspirin EC  81 mg Oral Daily   atorvastatin  10 mg Oral QHS   chlorhexidine  15 mL Mouth Rinse BID   Chlorhexidine Gluconate Cloth  6 each Topical Daily   Chlorhexidine Gluconate Cloth  6 each Topical Q0600   epoetin (EPOGEN/PROCRIT)  injection  6,000 Units Intravenous Q T,Th,Sa-HD   mouth rinse  15 mL Mouth Rinse q12n4p   sevelamer carbonate  2,400 mg Oral TID WC         ROSUnable to get any data   Physical Exam: Vital signs in last 24 hours: Temp:  [97.7 F (36.5 C)-99 F (37.2 C)] 97.7 F (36.5 C) (05/13 0800) Pulse Rate:  [79-109] 93 (05/13 0800) Resp:  [9-25] 11 (05/13 0800) BP: (80-170)/(49-91) 116/68 (05/13 0800) SpO2:  [94 %-100 %] 100 % (05/13 0800) FiO2 (%):  [30 %] 30 % (05/13 0800) Weight change:  Last BM Date :  (PTA)  Intake/Output from previous day: 05/12 0701 - 05/13 0700 In: 226.6 [I.V.:126.5; IV Piggyback:100.1] Out: 0  No intake/output data recorded.   Physical Exam:  General- pt is Lethargic, critically ill-appearing  HEENT head is atraumatic normocephalic BiPAP in situ  Resp-mild acute REsp distress,  Rhonchi  CVS- S1S2 regular in rate and rhythm  GIT- BS+, soft, Non tender , Non distended  EXT- No LE Edema,  No Cyanosis  Right AKA  Access- Right-sided tunneled catheter  Lab Results:  CBC  Recent Labs    09/17/21 0437 09/18/21 0551  WBC 10.0 7.8  HGB 9.1* 8.9*  HCT 32.0* 31.4*  PLT 248 251    BMET  Recent Labs    09/17/21 0437 09/18/21 0551  NA 141 141  K 4.0 4.2  CL 104 104  CO2 27 25  GLUCOSE 78 95  BUN 21 35*  CREATININE 7.39* 10.11*  CALCIUM 8.7* 9.0      Most recent Creatinine trend  Lab Results  Component Value Date   CREATININE 10.11 (H) 09/18/2021   CREATININE 7.39 (H) 09/17/2021   CREATININE 5.77 (H) 09/16/2021      MICRO   Recent Results (from the past 240 hour(s))  Blood culture (routine x 2)     Status: None (Preliminary result)   Collection Time: 09/16/21 12:59 PM   Specimen: BLOOD  Result Value Ref Range Status   Specimen Description BLOOD RIGHT FOREARM  Final   Special Requests BOTTLES DRAWN AEROBIC AND ANAEROBIC BCLV  Final   Culture   Final    NO GROWTH 2 DAYS Performed at [facility], [address], [place], [place] [address]    Report Status PENDING  Incomplete  Resp Panel by RT-PCR (Flu A&B, Covid) Nasopharyngeal Swab     Status: None   Collection Time: 09/16/21 12:59 PM   Specimen: Nasopharyngeal Swab; Nasopharyngeal(NP) swabs in vial transport medium  Result Value Ref Range Status   SARS Coronavirus 2 by RT PCR NEGATIVE NEGATIVE Final    Comment: (NOTE) SARS-CoV-2 target nucleic acids are NOT DETECTED.  The SARS-CoV-2 RNA is generally detectable in upper respiratory specimens during the acute phase of infection. The lowest concentration of SARS-CoV-2 viral copies this assay can detect is 138 copies/mL. A negative result does not preclude SARS-Cov-2 infection and should not be used as the sole basis for treatment or other patient management decisions. A negative result may occur with  improper specimen collection/handling, submission of specimen other than nasopharyngeal swab, presence of viral mutation(s) within the areas targeted by this assay, and inadequate number of viral copies(<138 copies/mL). A negative result must be combined with clinical observations, patient history, and epidemiological information. The expected result is Negative.  Fact Sheet for Patients:  BloggerCourse.com  Fact Sheet for Healthcare Providers:  SeriousBroker.it  This test is no t yet approved or cleared by the [place] FDA and  has been authorized for detection and/or diagnosis of SARS-CoV-2 by FDA under an Emergency Use Authorization (EUA). This EUA will remain  in effect (meaning this test can be used) for the duration of the COVID-19 declaration under Section 564(b)(1) of the Act, 21 U.S.C.section 360bbb-3(b)(1), unless the authorization is terminated  or revoked sooner.       Influenza A by PCR NEGATIVE NEGATIVE Final   Influenza B by PCR NEGATIVE NEGATIVE Final    Comment: (NOTE) The Xpert Xpress SARS-CoV-2/FLU/RSV plus  assay is intended as an aid in the diagnosis of influenza from Nasopharyngeal swab specimens and should not be used as a sole basis for treatment. Nasal washings and aspirates are unacceptable for Xpert Xpress SARS-CoV-2/FLU/RSV testing.  Fact Sheet for Patients: BloggerCourse.com  Fact Sheet for Healthcare Providers: SeriousBroker.it  This test is not yet approved or cleared by the [place] FDA and has been authorized for detection and/or diagnosis of SARS-CoV-2 by FDA under an Emergency Use Authorization (EUA). This EUA will remain in effect (meaning this test can be used) for the duration of the COVID-19 declaration under Section 564(b)(1) of the Act, 21 U.S.C. section 360bbb-3(b)(1), unless the authorization is terminated or revoked.  Performed at [facility], [address] [place], [place] [address]   MRSA Next Gen by PCR, Nasal     Status: None   Collection Time: 09/17/21 12:38 AM   Specimen: Nasal Mucosa; Nasal Swab  Result Value  Ref Range Status   MRSA by PCR Next Gen NOT DETECTED NOT DETECTED Final    Comment: (NOTE) The GeneXpert MRSA Assay (FDA approved for NASAL specimens only), is one component of a comprehensive MRSA colonization surveillance program. It is not intended to diagnose MRSA infection nor to guide or monitor treatment for MRSA infections. Test performance is not FDA approved in patients less than 75 years old. Performed at [facility], [address] [place], [place] [address]          Impression:   1)Renal    End-stage renal disease Patient is on hemodialysis Patient is on Monday Wednesday Friday schedule Patient could not be dialyzed on Friday secondary to transportation issues Patient was then dialyzed on Thursday We will dialyze patient today   2)Hypotension Patient is currently on midodrine   3)Anemia of chronic disease     Latest Ref Rng & Units  09/18/2021    5:51 AM 09/17/2021    4:37 AM 09/16/2021    9:55 AM  CBC  WBC 4.0 - 10.5 K/uL 7.8   10.0   15.2    Hemoglobin 13.0 - 17.0 g/dL 8.9   9.1   60.4    Hematocrit 39.0 - 52.0 % 31.4   32.0   36.2    Platelets 150 - 400 K/uL 251   248   302         HGb Is just at at goal (9--11) on Epogen dose  4) Secondary hyperparathyroidism -CKD Mineral-Bone Disorder    Lab Results  Component Value Date   PTH 354 (H) 09/07/2021   CALCIUM 9.0 09/18/2021   CAION 0.98 (L) 07/01/2021   PHOS 7.1 (H) 09/18/2021    Secondary Hyperparathyroidism Present.  Phosphorus is not at goal. Patient is on binders  5)Sepsis On broad spectrum Clinically stable   6) Electrolytes      Latest Ref Rng & Units 09/18/2021    5:51 AM 09/17/2021    4:37 AM 09/16/2021   12:05 PM  BMP  Glucose 70 - 99 mg/dL 95   78   93    BUN 8 - 23 mg/dL 35   21   15    Creatinine 0.61 - 1.24 mg/dL 54.09   8.11   9.14    Sodium 135 - 145 mmol/L 141   141   139    Potassium 3.5 - 5.1 mmol/L 4.2   4.0   3.9    Chloride 98 - 111 mmol/L 104   104   100    CO2 22 - 32 mmol/L 25   27   27     Calcium 8.9 - 10.3 mg/dL 9.0   8.7   8.4       Sodium Normonatremic   Potassium Normokalemic    7)Acid base    Co2 at goal  8)Acute hypoxic and hypercapnic respiratory failure Patient is currently on BiPAP   9)Altered mental status Patient metabolic encephalopathy unlikely to be from uremia I discussed the case with the primary team plan is to ask for neurology help   Plan:   We will dialyze patient today We will keep patient on Epogen      Johnny [name] s Wolfgang Phoenix 09/18/2021, 9:20 AM

## 2021-09-18 NOTE — Progress Notes (Signed)
Eeg done 

## 2021-09-18 NOTE — Progress Notes (Signed)
EEG personally reviewed, formal epileptologist read pending but on my review does not appear to be in status though does have potential epileptogenic activity.  ? ?- Keppra 250 mg nightly (may need 125 mg additional post-dialysis days but will dose after dialysis for now) ?- Taper home benzo, start with 0.25 mg ativan q8hr (home 1 mg clonazepam total daily dose would be equivalent to 2 mg ativan total daily dose but has not received any since admission and is currently with tenuous respiratory status) ?- Neurology will continue to follow ? ?Lesleigh Noe MD-PhD ?[facility] ?[phone]  ?[facility] coverage for [facility] is from 8 AM to 4 AM in-house and 4 PM to 8 PM by telephone/video. 8 PM to 8 AM emergent questions or overnight urgent questions should be addressed to Teleneurology On-call or [facility] neurohospitalist; contact information can be found on AMION ? ? ? ? ?

## 2021-09-18 NOTE — Progress Notes (Addendum)
? ? ? ?Progress Note  ? ? ?Johnny [name].  GEX:[id number] DOB: 08/29/1950  DOA: 09/16/2021 ?PCP: Denton Lank, MD  ? ? ? ? ?Brief Narrative:  ? ? ?Medical records reviewed and are as summarized below: ? ?Johnny Sargent. is a 71 y.o. male with medical history significant for ESRD on HD TTS, peripheral artery disease status post right AKA on aspirin and Eliquis, recent revascularization of left lower extremity status post critical limb ischemia in April 2023, hypertension, hyperlipidemia, gout, polyneuropathy, HFpEF 70 to 75%, recent admission for left lower extremity cellulitis.  He was sent from the outpatient hemodialysis center to the emergency room because of altered mental status and hypoxia with low oxygen saturation in the 70s. ? ?Work-up revealed severe sepsis secondary to pneumonia complicated by acute hypoxic respiratory failure and acute metabolic encephalopathy.. ? ? ? ? ?Assessment/Plan:  ? ?Principal Problem: ?  Severe sepsis ([place]) ?Active Problems: ?  Acute respiratory failure with hypoxia and hypercarbia (HCC) ?  Acute respiratory failure with hypoxia and hypercapnia (HCC) ?  CAP (community acquired pneumonia) ?  Acute metabolic encephalopathy ? ? ? ?Body mass index is 26.28 kg/m?. ? ? ?Severe sepsis secondary to pneumonia: Continue IV antibiotics.  No growth on blood cultures thus far. ? ?Acute hypoxic and hypercapnic respiratory failure: Continue BiPAP and wean off as able. ? ?Hypotension: Resolved. ? ?Lethargy/altered mental status/acute metabolic encephalopathy: EEG has been ordered to rule out subclinical seizures.  Consult Dr. Curly Shores, neurologist, to assist with management.  Ammonia level was normal.  Vitamin B1 and B12 levels are pending.  No acute abnormality on CT head.  No hypercapnia or respiratory acidosis on ABG. ? ?ESRD: Plan for hemodialysis today.  Follow-up with nephrologist. ? ?Stage II left buttock decubitus ulcer and unstageable left heel decubitus ulcer (present on admission):  Continue local wound care ? ?Recent left lower extremity revascularization for critical limb ischemia in April 2023: Unable to take Eliquis so he is on IV heparin. ? 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 ?Other comorbidities include chronic diastolic CHF, PVD s/p right AKA ? ?Diet Order   ? ?       ?  Diet NPO time specified  Diet effective now       ?  ? ?  ?  ? ?  ? ? ? ? ? ? ? ? ? ? ? ?Consultants: ?Intensivist, nephrologist, neurologist ? ?Procedures: ?None ? ? ? ?Medications:  ? ? aspirin EC  81 mg Oral Daily  ? atorvastatin  10 mg Oral QHS  ? chlorhexidine  15 mL Mouth Rinse BID  ? Chlorhexidine Gluconate Cloth  6 each Topical Daily  ? Chlorhexidine Gluconate Cloth  6 each Topical Q0600  ? epoetin (EPOGEN/PROCRIT) injection  6,000 Units Intravenous Q T,Th,Sa-HD  ? mouth rinse  15 mL Mouth Rinse q12n4p  ? sevelamer carbonate  2,400 mg Oral TID WC  ? ?Continuous Infusions: ? ceFEPime (MAXIPIME) IV Stopped (09/17/21 1424)  ? dextrose 30 mL/hr at 09/18/21 0500  ? heparin 800 Units/hr (09/18/21 0923)  ? vancomycin    ? ? ? ?Anti-infectives (From admission, onward)  ? ? Start     Dose/Rate Route Frequency Ordered Stop  ? 09/18/21 1800  vancomycin (VANCOCIN) IVPB 1000 mg/200 mL premix       ? 1,000 mg ?200 mL/hr over 60 Minutes Intravenous Every T-Th-Sa (1800) 09/16/21 1854    ? 09/17/21 1300  ceFEPIme (MAXIPIME) 1 g in sodium chloride  0.9 % 100 mL IVPB       ? 1 g ?200 mL/hr over 30 Minutes Intravenous Every 24 hours 09/16/21 1644    ? 09/16/21 1645  vancomycin (VANCOREADY) IVPB 750 mg/150 mL  Status:  Discontinued       ? 750 mg ?150 mL/hr over 60 Minutes Intravenous Every 24 hours 09/16/21 1644 09/16/21 1822  ? 09/16/21 1500  vancomycin (VANCOCIN) IVPB 1000 mg/200 mL premix       ? 1,000 mg ?200 mL/hr over 60 Minutes Intravenous  Once 09/16/21 1446 09/16/21 1735  ? 09/16/21 1245  vancomycin (VANCOCIN) IVPB 1000 mg/200 mL premix       ? 1,000 mg ?200 mL/hr over 60 Minutes  Intravenous  Once 09/16/21 1244 09/16/21 1504  ? 09/16/21 1245  ceFEPIme (MAXIPIME) 2 g in sodium chloride 0.9 % 100 mL IVPB       ? 2 g ?200 mL/hr over 30 Minutes Intravenous  Once 09/16/21 1244 09/16/21 1334  ? ?  ? ? ? ? ? ? ? ? ? ?Family Communication/Anticipated D/C date and plan/Code Status  ? ?DVT prophylaxis:  ? ? ?  Code Status: Full Code ? ?Family Communication: Plan discussed with Ms. [name]) ?Disposition Plan: Possible discharge to home in 3 to 4 days ? ? ?Status is: Inpatient ?Remains inpatient appropriate because: On IV antibiotics and BiPAP ? ? ? ? ? ? ?Subjective:  ? ?Interval events noted.  Patient is lethargic and confused and unable to provide any history ? ?Objective:  ? ? ?Vitals:  ? 09/18/21 0500 09/18/21 0700 09/18/21 0756 09/18/21 0800  ?BP: 123/81 125/64  116/68  ?Pulse: 98 94 93 93  ?Resp: 14 16 14 11   ?Temp: 98.5 ?F (36.9 ?C)   97.7 ?F (36.5 ?C)  ?TempSrc: Axillary   Oral  ?SpO2: 100% 100% 100% 100%  ?Weight:      ?Height:      ? ?No data found. ? ? ?Intake/Output Summary (Last 24 hours) at 09/18/2021 0926 ?Last data filed at 09/18/2021 0500 ?Gross per 24 hour  ?Intake 226.59 ml  ?Output 0 ml  ?Net 226.59 ml  ? ?Filed Weights  ? 09/16/21 0957 09/16/21 2353  ?Weight: 80.4 kg 78.4 kg  ? ? ?Exam: ? ?GEN: NAD ?SKIN: Warm and dry ?EYES: No pallor or icterus.  Unstageable left heel decubitus ulcer.  Stage II left buttock decubitus ulcer ?ENT: MMM ?CV: RRR ?PULM: CTA B ?ABD: soft, ND, NT, +BS ?CNS: Lethargic, unarousable ?EXT: No edema or tenderness ? ? ? ? ? ? ?Pressure Injury 09/17/21 Buttocks Left Stage 2 -  Partial thickness loss of dermis presenting as a shallow open injury with a red, pink wound bed without slough. (Active)  ?09/17/21 0023  ?Location: Buttocks  ?Location Orientation: Left  ?Staging: Stage 2 -  Partial thickness loss of dermis presenting as a shallow open injury with a red, pink wound bed without slough.  ?Wound Description (Comments):   ?Present on Admission: Yes   ?Dressing Type Foam - Lift dressing to assess site every shift 09/17/21 2000  ? ? ? ?Data Reviewed:  ? ?I have personally reviewed following labs and imaging studies: ? ?Labs: ?Labs show the following:  ? ?Basic Metabolic Panel: ?Recent Labs  ?Lab 09/16/21 ?1205 09/17/21 ?5956 09/18/21 ?3875  ?NA 139 141 141  ?K 3.9 4.0 4.2  ?CL 100 104 104  ?CO2 27 27 25   ?GLUCOSE 93 78 95  ?BUN 15 21 35*  ?CREATININE 5.77* 7.39*  10.11*  ?CALCIUM 8.4* 8.7* 9.0  ?MG  --  2.0 2.2  ?PHOS  --  5.0* 7.1*  ? ?GFR ?Estimated Creatinine Clearance: 6.4 mL/min (A) (by C-G formula based on SCr of 10.11 mg/dL (H)). ?Liver Function Tests: ?Recent Labs  ?Lab 09/16/21 ?1205 09/17/21 ?4401  ?AST 16 17  ?ALT 6 6  ?ALKPHOS 55 59  ?BILITOT 0.5 0.3  ?PROT 7.6 7.1  ?ALBUMIN 3.2* 3.3*  ? ?No results for input(s): LIPASE, AMYLASE in the last 168 hours. ?Recent Labs  ?Lab 09/16/21 ?1021  ?AMMONIA 21  ? ?Coagulation profile ?Recent Labs  ?Lab 09/17/21 ?2254  ?INR 1.9*  ? ? ?CBC: ?Recent Labs  ?Lab 09/16/21 ?0955 09/17/21 ?0272 09/18/21 ?5366  ?WBC 15.2* 10.0 7.8  ?NEUTROABS  --  6.9  --   ?HGB 10.4* 9.1* 8.9*  ?HCT 36.2* 32.0* 31.4*  ?MCV 101.1* 102.6* 101.9*  ?PLT 302 248 251  ? ?Cardiac Enzymes: ?No results for input(s): CKTOTAL, CKMB, CKMBINDEX, TROPONINI in the last 168 hours. ?BNP (last 3 results) ?No results for input(s): PROBNP in the last 8760 hours. ?CBG: ?Recent Labs  ?Lab 09/17/21 ?2330 09/17/21 ?2356 09/18/21 ?0157 09/18/21 ?4403 09/18/21 ?0753  ?GLUCAP 55* 109* 83 81 88  ? ?D-Dimer: ?No results for input(s): DDIMER in the last 72 hours. ?Hgb A1c: ?No results for input(s): HGBA1C in the last 72 hours. ?Lipid Profile: ?No results for input(s): CHOL, HDL, LDLCALC, TRIG, CHOLHDL, LDLDIRECT in the last 72 hours. ?Thyroid function studies: ?Recent Labs  ?  09/16/21 ?1205  ?TSH 1.000  ? ?Anemia work up: ?No results for input(s): VITAMINB12, FOLATE, FERRITIN, TIBC, IRON, RETICCTPCT in the last 72 hours. ?Sepsis Labs: ?Recent Labs  ?Lab 09/16/21 ?4742  09/16/21 ?1205 09/16/21 ?1259 09/17/21 ?5956 09/17/21 ?1004 09/17/21 ?1355 09/18/21 ?3875  ?PROCALCITON  --  1.39  --  1.57  --   --   --   ?WBC 15.2*  --   --  10.0  --   --  7.8  ?LATICACIDVEN  --   --

## 2021-09-18 NOTE — Consult Note (Signed)
Neurology Consultation ?Reason for Consult: AMS ?Requesting Physician: Jennye Boroughs ? ?CC: AMS  ? ?History is obtained from: Chart review and medical team, able  ? ?HPI: Johnny [name]. is a 71 y.o. male with a past medical history significant for hypertension, hyperlipidemia, end-stage renal disease on dialysis (Tuesday Thursday Saturday), coronary artery disease with cardiomyopathy and chronic diastolic congestive heart failure with preserved ejection fraction, polyneuropathy, peripheral vascular disease s/p right AKA (2020) and recent revascularization of the left lower extremity (April 2023), acute on chronic hypoxic and hypercarbic respiratory failure with concern for sepsis ? ?He was initially admitted on 09/16/2021 due to hypoxia and altered mental status. Per notes in the chart, he has gone to dialysis and had a sudden change in mental status with hypoxia. He has been managed with BiPAP, and has been on a heparin drip. He has been started on cefepime and vancomycin on 09/16/2021 for infection of unknown source, possible respiratory infection although x-ray has not been revealing; I do not see a documented fever and blood cultures have been negative to date since admission.  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 He has been cleared to have UE BP checks given fistulas are not working (he receives dialysis through a line currently) ? ?Other deliriogenic medications he has received recently include Compazine 10 mg on 5/11 at 18:33, and oxycodone 5/12 just after midnight. ? ?Per nurse at bedside until yesterday evening his mental status had been gradually improving to the point that he was oriented to person, place, and full date but confused as to why he was  in the hospital before he suddenly became unresponsive. There has been some small improvement in mental status today. ? ?He was additionally recently admitted from 4/29 - 09/08/2021 for left lower extremity cellulitis and critical limb ischemia ? 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  At that time he had also been on chronic clonazepam nightly which was discontinued. ? ?He follows with [facility] and was last seen 10/4 with complaints of mild memory impairment and stable neck/arm twitching on gabapentin and clonazepam (reportedly improves with clonazepam)  ? ?Additional history regarding the twitching was obtained from his healthcare power of attorney, who notes that the patient has left sided twitching starting in the arm, then spreading to the leg and the face, lasting a few minutes at a time. He would be weaker on the left side afterwards and very tired. These episodes started about 5 years ago possibly and worsened, 2 years ago to where almost fell out of his wheelchair and couldn't get dressed -- started on Clonazepam at that time, and had not had any further episodes to HCPOA's knowledge until Saturday.  She notes he was getting clonazepam regularly even at [facility], as she was making sure of that ? ?ROS: Unable to obtain due to altered mental status.  ? ?Past Medical History:  ?Diagnosis Date  ? Anemia   ? Anxiety   ? CHF (congestive heart failure) ([place])   ? Chronic kidney disease   ? esrd dialysis m/w/f  ? Dialysis patient [facility])   ? M, W, F  ? Gout   ?  Hyperlipidemia   ? Hypertension   ? Myocardial infarction [facility]) 2010  ? 10 years ago  ? Neuromuscular disorder ([place]) 2020  ? 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  ? Peripheral vascular disease ([place])   ? S/P BKA (below knee amputation), right ([place])   ? Wears dentures   ? full upper and lower  ? ?Past Surgical History:  ?Procedure Laterality Date  ? A/V  FISTULAGRAM Right 09/06/2018  ? Procedure: A/V FISTULAGRAM;  Surgeon: Algernon Huxley, MD;  Location: [place] [facility];  Service: Cardiovascular;  Laterality: Right;  ? A/V SHUNTOGRAM Left 06/21/2017  ? Procedure: A/V SHUNTOGRAM;  Surgeon: Katha Cabal, MD;  Location: [place] [facility];  Service: Cardiovascular;  Laterality: Left;  ? A/V SHUNTOGRAM N/A 10/24/2018  ? Procedure: A/V SHUNTOGRAM;  Surgeon: Algernon Huxley, MD;  Location: [place] [facility];  Service: Cardiovascular;  Laterality: N/A;  ? ABOVE KNEE LEG AMPUTATION Right 2020  ? AMPUTATION Right 10/25/2018  ? Procedure: AMPUTATION ABOVE KNEE;  Surgeon: Algernon Huxley, MD;  Location: [facility] OR[facility];  Service: General;  Laterality: Right;  ? APPLICATION OF WOUND VAC Right 04/11/2018  ? Procedure: APPLICATION OF WOUND VAC;  Surgeon: Algernon Huxley, MD;  Location: [facility] ORS;  Service: Vascular;  Laterality: Right;  ? AV FISTULA PLACEMENT Left 09/18/2015  ? Procedure: INSERTION OF ARTERIOVENOUS (AV) GORE-TEX GRAFT ARM ( BRACH/AXILLARY GRAFT W/ INSTANT STICK GRAFT );  Surgeon: Katha Cabal, MD;  Location: [facility] ORS;  Service: Vascular;  Laterality: Left;  ? AV FISTULA PLACEMENT Right 07/19/2018  ? Procedure: INSERTION OF ARTERIOVENOUS (AV) GORE-TEX GRAFT ARM ( BRACHIAL AXILLARY);  Surgeon: Algernon Huxley, MD;  Location: [facility] OR[facility];  Service: Vascular;  Laterality: Right;  ? CATARACT EXTRACTION W/PHACO Left 07/01/2021  ? Procedure: CATARACT EXTRACTION PHACO AND INTRAOCULAR LENS PLACEMENT (IOC) LEFT 7.92 00:47.3;  Surgeon: Eulogio Bear, MD;  Location: [facility] ORS;  Service: Ophthalmology;  Laterality: Left;  ? DIALYSIS FISTULA CREATION Right 10/2017  ? right chest perm cath  ? DIALYSIS/PERMA CATHETER INSERTION N/A 06/09/2020  ? Procedure: DIALYSIS/PERMA CATHETER INSERTION;  Surgeon: Algernon Huxley, MD;  Location: [place] [facility];  Service: Cardiovascular;  Laterality: N/A;  ? DIALYSIS/PERMA CATHETER INSERTION N/A 07/30/2020  ? Procedure: DIALYSIS/PERMA CATHETER EXCHANGE;   Surgeon: Algernon Huxley, MD;  Location: [place] [facility];  Service: Cardiovascular;  Laterality: N/A;  ? DIALYSIS/PERMA CATHETER REMOVAL N/A 09/13/2018  ? Procedure: DIALYSIS/PERMA CATHETER REMOVAL;  Surgeon: Algernon Huxley, MD;  Location: [place] [facility];  Service: Cardiovascular;  Laterality: N/A;  ? ESOPHAGOGASTRODUODENOSCOPY N/A 12/19/2017  ? Procedure: ESOPHAGOGASTRODUODENOSCOPY (EGD);  Surgeon: Lin Landsman, MD;  Location: [facility] ENDOSCOPY;  Service: Gastroenterology;  Laterality: N/A;  ? LOWER EXTREMITY ANGIOGRAPHY Left 11/16/2017  ? Procedure: LOWER EXTREMITY ANGIOGRAPHY;  Surgeon: Algernon Huxley, MD;  Location: [place] [facility];  Service: Cardiovascular;  Laterality: Left;  ? LOWER EXTREMITY ANGIOGRAPHY Right 01/18/2018  ? Procedure: LOWER EXTREMITY ANGIOGRAPHY;  Surgeon: Algernon Huxley, MD;  Location: [place] [facility];  Service: Cardiovascular;  Laterality: Right;  ? LOWER EXTREMITY ANGIOGRAPHY Left 04/02/2018  ? Procedure: LOWER EXTREMITY ANGIOGRAPHY;  Surgeon: Algernon Huxley, MD;  Location: [place] [facility];  Service: Cardiovascular;  Laterality: Left;  ? LOWER EXTREMITY ANGIOGRAPHY Right 04/09/2018  ? Procedure: Lower Extremity Angiography with possible intervention;  Surgeon: Algernon Huxley, MD;  Location: [place] [facility];  Service: Cardiovascular;  Laterality: Right;  ? LOWER EXTREMITY ANGIOGRAPHY  Right 07/23/2018  ? Procedure: Lower Extremity Angiography;  Surgeon: Algernon Huxley, MD;  Location: [place] [facility];  Service: Cardiovascular;  Laterality: Right;  ? LOWER EXTREMITY ANGIOGRAPHY Right 09/13/2018  ? Procedure: LOWER EXTREMITY ANGIOGRAPHY;  Surgeon: Algernon Huxley, MD;  Location: [place] [facility];  Service: Cardiovascular;  Laterality: Right;  ? LOWER EXTREMITY ANGIOGRAPHY Left 08/20/2021  ? Procedure: Lower Extremity Angiography;  Surgeon: Algernon Huxley, MD;  Location: [place] [facility];  Service: Cardiovascular;  Laterality: Left;  ? LOWER EXTREMITY VENOGRAPHY Right 09/13/2018   ? Procedure: LOWER EXTREMITY VENOGRAPHY;  Surgeon: Algernon Huxley, MD;  Location: [place] [facility];  Service: Cardiovascular;  Laterality: Right;  ? PERIPHERAL VASCULAR CATHETERIZATION Left 09/01/2015

## 2021-09-18 NOTE — Progress Notes (Signed)
Hemodialysis Post Treatment Note: ? ?Tx date:09/18/2021 ?Tx time: 3 hours ?Access: Right CVC ?UF Removed: 1.5L ? ?Note: ?16:12 HD started. BP 134/72 ?17:30 UF net goal decreased to 1.5L from 2L due to low BP at 96/58. machine temp also decreased to 36.5 from 37 to help increase BP. Dr Theador Hawthorne notified and he agreed. Continue monitoring ?19:20 HD completed. Post HD BP 138/81 ? ? ? ? ? ? ? ? ? ? ?  ?

## 2021-09-18 NOTE — Progress Notes (Signed)
Patient has had an eventful day, this morning the patient was lethargic and hard to arouse, in the noon patient continued lethargic with agitation when stimulated.  When attempting to obtain patient's blood glucose at noon patient swatted and pushed this nurse away.  Patient to receive hemodialysis at the bedside and unable to reach POA, Dr. Theador Hawthorne notified and emergency consent obtained as patient is need of dialysis with mental status change.  EEG was performed at bedside this afternoon as well and neurologist reviewed.  New orders for ativan and keppra received and patient tolerating medication well.  Patient is currently receiving dialysis, will continue to monitor. ?

## 2021-09-18 NOTE — Consult Note (Signed)
ANTICOAGULATION CONSULT NOTE  ? ?Pharmacy Consult for heparin ?Indication:  PVD previously on apixaban ? ?Allergies  ?Allergen Reactions  ? Shellfish Allergy Anaphylaxis  ? Other   ? ? ?Patient Measurements: ?Height: 5\' 8"  (172.7 cm) ?Weight: 78.4 kg (172 lb 13.5 oz) ?IBW/kg (Calculated) : 68.4 ?Heparin Dosing Weight: 78.4 kg ? ?Vital Signs: ?Temp: 98.5 ?F (36.9 ?C) (05/13 0500) ?Temp Source: Axillary (05/13 0500) ?BP: 123/81 (05/13 0500) ?Pulse Rate: 93 (05/13 0756) ? ?Labs: ?Recent Labs  ?  09/16/21 ?0955 09/16/21 ?1205 09/17/21 ?7510 09/17/21 ?2254 09/18/21 ?2585  ?HGB 10.4*  --  9.1*  --  8.9*  ?HCT 36.2*  --  32.0*  --  31.4*  ?PLT 302  --  248  --  251  ?APTT  --   --   --  45* 123*  ?LABPROT  --   --   --  22.0*  --   ?INR  --   --   --  1.9*  --   ?HEPARINUNFRC  --   --   --  >1.10* >1.10*  ?CREATININE  --  5.77* 7.39*  --  10.11*  ?TROPONINIHS  --  84*  --   --   --   ? ? ? ?Estimated Creatinine Clearance: 6.4 mL/min (A) (by C-G formula based on SCr of 10.11 mg/dL (H)). ? ? ?Medical History: ?Past Medical History:  ?Diagnosis Date  ? Anemia   ? Anxiety   ? CHF (congestive heart failure) ([place])   ? Chronic kidney disease   ? esrd dialysis m/w/f  ? Dialysis patient [facility])   ? M, W, F  ? Gout   ? Hyperlipidemia   ? Hypertension   ? Myocardial infarction [facility]) 2010  ? 10 years ago  ? Neuromuscular disorder ([place]) 2020  ? 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  ? Peripheral vascular disease ([place])   ? S/P BKA (below knee amputation), right ([place])   ? Wears dentures   ? full upper and lower  ? ? ?Medications:  ?Medications Prior to Admission  ?Medication Sig Dispense Refill Last Dose  ? allopurinol (ZYLOPRIM) 100 MG tablet Take 100 mg by mouth every evening.   09/15/2021 at 2000  ? apixaban (ELIQUIS) 5 MG TABS tablet Take 1 tablet (5 mg total) by mouth 2 (two) times daily. 60 tablet 5 09/15/2021 at 2000  ? aspirin EC 81 MG tablet Take 1 tablet (81 mg total) by mouth daily. 150 tablet 2 09/15/2021  ? atorvastatin (LIPITOR)  10 MG tablet Take 10 mg by mouth at bedtime.   09/15/2021 at 2000  ? carvedilol (COREG) 3.125 MG tablet Take 3.125 mg by mouth 2 (two) times daily.   09/15/2021 at 2000  ? docusate sodium (COLACE) 100 MG capsule Take 1 capsule (100 mg total) by mouth 2 (two) times daily. 60 capsule 0 09/15/2021 at 2000  ? gabapentin (NEURONTIN) 100 MG capsule Take 2 capsules (200 mg total) by mouth 2 (two) times daily. After dialysis session (Patient taking differently: Take 100 mg by mouth See admin instructions. Take 100 mg twice a day and on MON. Wed and Friday take and addition 100 mg in the evening after dialysis)   09/15/2021 at 2000  ? losartan (COZAAR) 100 MG tablet Hold until followup with outpatient doctor due to intermittently low blood pressure. (Patient taking differently: Take 100 mg by mouth every evening.)   09/15/2021 at 2000  ? oxyCODONE-acetaminophen (PERCOCET/ROXICET) 5-325 MG tablet Take 2 tablets by mouth every 6 (six)  hours as needed. 20 tablet 0 09/15/2021 at 2000  ? sevelamer carbonate (RENVELA) 800 MG tablet Take 2,400 mg by mouth 3 (three) times daily with meals.   09/15/2021  ? vitamin C (ASCORBIC ACID) 500 MG tablet Take 500 mg by mouth daily.   09/15/2021  ? clonazePAM (KLONOPIN) 0.5 MG tablet Take 1 tablet (0.5 mg total) by mouth as needed. 10 tablet 0 prn  ? folic acid-vitamin b complex-vitamin c-selenium-zinc (DIALYVITE) 3 MG TABS tablet Take 1 tablet by mouth daily.      ? ondansetron (ZOFRAN-ODT) 4 MG disintegrating tablet Take 1 tablet (4 mg total) by mouth every 6 (six) hours as needed for nausea or vomiting. 20 tablet 0 prn  ? polyethylene glycol (MIRALAX / GLYCOLAX) 17 g packet Take 17 g by mouth daily as needed for mild constipation. 14 each 0 prn  ? ?Scheduled:  ? aspirin EC  81 mg Oral Daily  ? atorvastatin  10 mg Oral QHS  ? chlorhexidine  15 mL Mouth Rinse BID  ? Chlorhexidine Gluconate Cloth  6 each Topical Daily  ? Chlorhexidine Gluconate Cloth  6 each Topical Q0600  ? epoetin (EPOGEN/PROCRIT)  injection  6,000 Units Intravenous Q T,Th,Sa-HD  ? mouth rinse  15 mL Mouth Rinse q12n4p  ? sevelamer carbonate  2,400 mg Oral TID WC  ? ?Infusions:  ? ceFEPime (MAXIPIME) IV Stopped (09/17/21 1424)  ? dextrose 30 mL/hr at 09/18/21 0500  ? heparin 950 Units/hr (09/18/21 0500)  ? vancomycin    ? ?PRN: acetaminophen, oxyCODONE-acetaminophen, polyethylene glycol, prochlorperazine ?Anti-infectives (From admission, onward)  ? ? Start     Dose/Rate Route Frequency Ordered Stop  ? 09/18/21 1800  vancomycin (VANCOCIN) IVPB 1000 mg/200 mL premix       ? 1,000 mg ?200 mL/hr over 60 Minutes Intravenous Every T-Th-Sa (1800) 09/16/21 1854    ? 09/17/21 1300  ceFEPIme (MAXIPIME) 1 g in sodium chloride 0.9 % 100 mL IVPB       ? 1 g ?200 mL/hr over 30 Minutes Intravenous Every 24 hours 09/16/21 1644    ? 09/16/21 1645  vancomycin (VANCOREADY) IVPB 750 mg/150 mL  Status:  Discontinued       ? 750 mg ?150 mL/hr over 60 Minutes Intravenous Every 24 hours 09/16/21 1644 09/16/21 1822  ? 09/16/21 1500  vancomycin (VANCOCIN) IVPB 1000 mg/200 mL premix       ? 1,000 mg ?200 mL/hr over 60 Minutes Intravenous  Once 09/16/21 1446 09/16/21 1735  ? 09/16/21 1245  vancomycin (VANCOCIN) IVPB 1000 mg/200 mL premix       ? 1,000 mg ?200 mL/hr over 60 Minutes Intravenous  Once 09/16/21 1244 09/16/21 1504  ? 09/16/21 1245  ceFEPIme (MAXIPIME) 2 g in sodium chloride 0.9 % 100 mL IVPB       ? 2 g ?200 mL/hr over 30 Minutes Intravenous  Once 09/16/21 1244 09/16/21 1334  ? ?  ? ? ?Assessment: ?Pharmacy consulted to start heparin while holding apixaban. Last dose apixaban 5/11 PM. Will use aPTT to monitor heparin as apixaban can falsely elevate heparin levels.  ? ?5/13 0753  aPTT 123/ HL >1.10   Supratherapeutic, will decrease from 950 units/hr to 900 units/hr ? ? ?Goal of Therapy:  ?aPTT 66-102 ?Heparin level 0.3-0.7 units/ml ?Monitor platelets by anticoagulation protocol: Yes ?  ?Plan:  ?5/13 0753  aPTT 123/ HL >1.10   Supratherapeutic, will decrease  from 950 units/hr to 800 units/hr. ?F/u aPTT 8 hrs after rate change ?F/u  HL daily until correlates with aPTT, then change to HL monitoring. ? ? ?Noralee Space, PharmD ?09/18/2021,8:21 AM ? ? ? ?

## 2021-09-18 NOTE — Plan of Care (Signed)
Continuing with plan of care. 

## 2021-09-18 NOTE — Consult Note (Signed)
ANTICOAGULATION CONSULT NOTE  ? ?Pharmacy Consult for heparin ?Indication:  PVD previously on apixaban ? ?Allergies  ?Allergen Reactions  ? Shellfish Allergy Anaphylaxis  ? Other   ? ? ?Patient Measurements: ?Height: 5\' 8"  (172.7 cm) ?Weight: 78.5 kg (173 lb 1 oz) ?IBW/kg (Calculated) : 68.4 ?Heparin Dosing Weight: 78.4 kg ? ?Vital Signs: ?Temp: 97.5 ?F (36.4 ?C) (05/13 1600) ?Temp Source: Axillary (05/13 1600) ?BP: 100/61 (05/13 1747) ?Pulse Rate: 85 (05/13 1747) ? ?Labs: ?Recent Labs  ?  09/16/21 ?0955 09/16/21 ?1205 09/17/21 ?1696 09/17/21 ?2254 09/18/21 ?7893 09/18/21 ?1724  ?HGB 10.4*  --  9.1*  --  8.9*  --   ?HCT 36.2*  --  32.0*  --  31.4*  --   ?PLT 302  --  248  --  251  --   ?APTT  --   --   --  45* 123* 98*  ?LABPROT  --   --   --  22.0*  --   --   ?INR  --   --   --  1.9*  --   --   ?HEPARINUNFRC  --   --   --  >1.10* >1.10*  --   ?CREATININE  --  5.77* 7.39*  --  10.11*  --   ?TROPONINIHS  --  34*  --   --   --   --   ? ? ? ?Estimated Creatinine Clearance: 6.4 mL/min (A) (by C-G formula based on SCr of 10.11 mg/dL (H)). ? ? ?Medical History: ?Past Medical History:  ?Diagnosis Date  ? Anemia   ? Anxiety   ? CHF (congestive heart failure) ([place])   ? Chronic kidney disease   ? esrd dialysis m/w/f  ? Dialysis patient [facility])   ? M, W, F  ? Gout   ? Hyperlipidemia   ? Hypertension   ? Myocardial infarction [facility]) 2010  ? 10 years ago  ? Neuromuscular disorder ([place]) 2020  ? 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  ? Peripheral vascular disease ([place])   ? S/P BKA (below knee amputation), right ([place])   ? Wears dentures   ? full upper and lower  ? ? ?Medications:  ?Medications Prior to Admission  ?Medication Sig Dispense Refill Last Dose  ? allopurinol (ZYLOPRIM) 100 MG tablet Take 100 mg by mouth every evening.   09/15/2021 at 2000  ? apixaban (ELIQUIS) 5 MG TABS tablet Take 1 tablet (5 mg total) by mouth 2 (two) times daily. 60 tablet 5 09/15/2021 at 2000  ? aspirin EC 81 MG tablet Take 1 tablet (81 mg total) by mouth  daily. 150 tablet 2 09/15/2021  ? atorvastatin (LIPITOR) 10 MG tablet Take 10 mg by mouth at bedtime.   09/15/2021 at 2000  ? carvedilol (COREG) 3.125 MG tablet Take 3.125 mg by mouth 2 (two) times daily.   09/15/2021 at 2000  ? docusate sodium (COLACE) 100 MG capsule Take 1 capsule (100 mg total) by mouth 2 (two) times daily. 60 capsule 0 09/15/2021 at 2000  ? gabapentin (NEURONTIN) 100 MG capsule Take 2 capsules (200 mg total) by mouth 2 (two) times daily. After dialysis session (Patient taking differently: Take 100 mg by mouth See admin instructions. Take 100 mg twice a day and on MON. Wed and Friday take and addition 100 mg in the evening after dialysis)   09/15/2021 at 2000  ? losartan (COZAAR) 100 MG tablet Hold until followup with outpatient doctor due to intermittently low blood pressure. (Patient taking differently:  Take 100 mg by mouth every evening.)   09/15/2021 at 2000  ? oxyCODONE-acetaminophen (PERCOCET/ROXICET) 5-325 MG tablet Take 2 tablets by mouth every 6 (six) hours as needed. 20 tablet 0 09/15/2021 at 2000  ? sevelamer carbonate (RENVELA) 800 MG tablet Take 2,400 mg by mouth 3 (three) times daily with meals.   09/15/2021  ? vitamin C (ASCORBIC ACID) 500 MG tablet Take 500 mg by mouth daily.   09/15/2021  ? clonazePAM (KLONOPIN) 0.5 MG tablet Take 1 tablet (0.5 mg total) by mouth as needed. 10 tablet 0 prn  ? folic acid-vitamin b complex-vitamin c-selenium-zinc (DIALYVITE) 3 MG TABS tablet Take 1 tablet by mouth daily.      ? ondansetron (ZOFRAN-ODT) 4 MG disintegrating tablet Take 1 tablet (4 mg total) by mouth every 6 (six) hours as needed for nausea or vomiting. 20 tablet 0 prn  ? polyethylene glycol (MIRALAX / GLYCOLAX) 17 g packet Take 17 g by mouth daily as needed for mild constipation. 14 each 0 prn  ? ?Scheduled:  ? aspirin EC  81 mg Oral Daily  ? atorvastatin  10 mg Oral QHS  ? chlorhexidine  15 mL Mouth Rinse BID  ? Chlorhexidine Gluconate Cloth  6 each Topical Daily  ? Chlorhexidine Gluconate  Cloth  6 each Topical Q0600  ? epoetin (EPOGEN/PROCRIT) injection  6,000 Units Intravenous Q T,Th,Sa-HD  ? heparin sodium (porcine)      ? LORazepam  0.25 mg Intravenous Q8H  ? mouth rinse  15 mL Mouth Rinse q12n4p  ? sevelamer carbonate  2,400 mg Oral TID WC  ? ?Infusions:  ? sodium chloride    ? sodium chloride    ? ceFEPime (MAXIPIME) IV 1 g (09/18/21 1744)  ? dextrose 30 mL/hr at 09/18/21 1700  ? heparin 800 Units/hr (09/18/21 1700)  ? levETIRAcetam 250 mg (09/18/21 1720)  ? vancomycin    ? ?PRN: sodium chloride, sodium chloride, acetaminophen, alteplase, heparin, heparin, lidocaine (PF), lidocaine-prilocaine, oxyCODONE-acetaminophen, pentafluoroprop-tetrafluoroeth, polyethylene glycol, prochlorperazine ?Anti-infectives (From admission, onward)  ? ? Start     Dose/Rate Route Frequency Ordered Stop  ? 09/18/21 1800  vancomycin (VANCOCIN) IVPB 1000 mg/200 mL premix       ? 1,000 mg ?200 mL/hr over 60 Minutes Intravenous Every T-Th-Sa (1800) 09/16/21 1854    ? 09/17/21 1300  ceFEPIme (MAXIPIME) 1 g in sodium chloride 0.9 % 100 mL IVPB       ? 1 g ?200 mL/hr over 30 Minutes Intravenous Every 24 hours 09/16/21 1644    ? 09/16/21 1645  vancomycin (VANCOREADY) IVPB 750 mg/150 mL  Status:  Discontinued       ? 750 mg ?150 mL/hr over 60 Minutes Intravenous Every 24 hours 09/16/21 1644 09/16/21 1822  ? 09/16/21 1500  vancomycin (VANCOCIN) IVPB 1000 mg/200 mL premix       ? 1,000 mg ?200 mL/hr over 60 Minutes Intravenous  Once 09/16/21 1446 09/16/21 1735  ? 09/16/21 1245  vancomycin (VANCOCIN) IVPB 1000 mg/200 mL premix       ? 1,000 mg ?200 mL/hr over 60 Minutes Intravenous  Once 09/16/21 1244 09/16/21 1504  ? 09/16/21 1245  ceFEPIme (MAXIPIME) 2 g in sodium chloride 0.9 % 100 mL IVPB       ? 2 g ?200 mL/hr over 30 Minutes Intravenous  Once 09/16/21 1244 09/16/21 1334  ? ?  ? ? ?Assessment: ?Pharmacy consulted to start heparin while holding apixaban. Last dose apixaban 5/11 PM. Will use aPTT to monitor heparin as  apixaban  can falsely elevate heparin levels.  ? ?5/13 0753  aPTT 123/ HL >1.10   Supratherapeutic ?5/13 1724 aPTT 98  Therapeutic ? ? ?Goal of Therapy:  ?aPTT 66-102 ?Heparin level 0.3-0.7 units/ml ?Monitor platelets by anticoagulation protocol: Yes ?  ?Plan:  ?aPTT is therapeutic. Continue heparin at 800 units/hr. Re-check aPTT in 8 hours to confirm. Follow up HL daily until correlates with aPTT, then change to HL monitoring. Monitor CBC daily.  ? ? ?Sherilyn Banker, PharmD ?09/18/2021,5:58 PM ? ? ? ?

## 2021-09-18 NOTE — Procedures (Signed)
Patient Name: Johnny [name].  ?MRN: [id number]  ?Epilepsy Attending: Lora Havens  ?Referring Physician/Provider: Lorenza Chick, MD ?Date: 09/18/2021 ?Duration: 24.17 mins ? ?Patient history: 71yo M with AMS. EEG to evaluate for seizure. ? ?Level of alertness: lethargic  ? ?AEDs during EEG study: LEV, Ativan ? ?Technical aspects: This EEG study was done with scalp electrodes positioned according to the 10-20 International system of electrode placement. Electrical activity was acquired at a sampling rate of 500Hz  and reviewed with a high frequency filter of 70Hz  and a low frequency filter of 1Hz . EEG data were recorded continuously and digitally stored.  ? ?Description: No posterior dominant rhythm was seen. EEG showed continuous generalized 3 to 6 Hz theta-delta slowing. Sharp transients are noted in left and right frontotemporal region. Hyperventilation and photic stimulation were not performed.    ? ?ABNORMALITY ?- Continuous slow, generalized ? ?IMPRESSION: ?This study is suggestive of moderate to severe diffuse encephalopathy, nonspecific etiology. No seizures or definite epileptiform discharges were seen throughout the recording. ? ?Lora Havens  ? ?

## 2021-09-19 ENCOUNTER — Inpatient Hospital Stay: Payer: Medicare Other

## 2021-09-19 DIAGNOSIS — A419 Sepsis, unspecified organism: Secondary | ICD-10-CM | POA: Diagnosis not present

## 2021-09-19 DIAGNOSIS — J9601 Acute respiratory failure with hypoxia: Secondary | ICD-10-CM | POA: Diagnosis not present

## 2021-09-19 DIAGNOSIS — J189 Pneumonia, unspecified organism: Secondary | ICD-10-CM | POA: Diagnosis not present

## 2021-09-19 DIAGNOSIS — G9341 Metabolic encephalopathy: Secondary | ICD-10-CM | POA: Diagnosis not present

## 2021-09-19 DIAGNOSIS — J9602 Acute respiratory failure with hypercapnia: Secondary | ICD-10-CM | POA: Diagnosis not present

## 2021-09-19 DIAGNOSIS — L89620 Pressure ulcer of left heel, unstageable: Secondary | ICD-10-CM | POA: Diagnosis present

## 2021-09-19 LAB — BLOOD GAS, VENOUS
Acid-Base Excess: 1.8 mmol/L (ref 0.0–2.0)
Bicarbonate: 31.1 mmol/L — ABNORMAL HIGH (ref 20.0–28.0)
Drawn by: 7021
Patient temperature: 37
pCO2, Ven: 71 mmHg (ref 44–60)
pH, Ven: 7.25 (ref 7.25–7.43)

## 2021-09-19 LAB — GLUCOSE, CAPILLARY
Glucose-Capillary: 104 mg/dL — ABNORMAL HIGH (ref 70–99)
Glucose-Capillary: 114 mg/dL — ABNORMAL HIGH (ref 70–99)
Glucose-Capillary: 109 mg/dL — ABNORMAL HIGH (ref 70–99)

## 2021-09-19 LAB — BLOOD GAS, ARTERIAL
Acid-Base Excess: 5.8 mmol/L — ABNORMAL HIGH (ref 0.0–2.0)
Bicarbonate: 32.5 mmol/L — ABNORMAL HIGH (ref 20.0–28.0)
Delivery systems: POSITIVE
Expiratory PAP: 6 cmH2O
FIO2: 30 %
Inspiratory PAP: 20 cmH2O
O2 Saturation: 99 %
Patient temperature: 37
pCO2 arterial: 55 mmHg — ABNORMAL HIGH (ref 32–48)
pH, Arterial: 7.38 (ref 7.35–7.45)
pO2, Arterial: 120 mmHg — ABNORMAL HIGH (ref 83–108)

## 2021-09-19 LAB — BASIC METABOLIC PANEL
Anion gap: 14 (ref 5–15)
BUN: 17 mg/dL (ref 8–23)
CO2: 27 mmol/L (ref 22–32)
Calcium: 8.8 mg/dL — ABNORMAL LOW (ref 8.9–10.3)
Chloride: 93 mmol/L — ABNORMAL LOW (ref 98–111)
Creatinine, Ser: 5.65 mg/dL — ABNORMAL HIGH (ref 0.61–1.24)
GFR, Estimated: 10 mL/min — ABNORMAL LOW (ref >60)
Glucose, Bld: 111 mg/dL — ABNORMAL HIGH (ref 70–99)
Potassium: 3.4 mmol/L — ABNORMAL LOW (ref 3.5–5.1)
Sodium: 134 mmol/L — ABNORMAL LOW (ref 135–145)

## 2021-09-19 LAB — CBC
HCT: 32.9 % — ABNORMAL LOW (ref 39.0–52.0)
Hemoglobin: 9.8 g/dL — ABNORMAL LOW (ref 13.0–17.0)
MCH: 29.5 pg (ref 26.0–34.0)
MCHC: 29.8 g/dL — ABNORMAL LOW (ref 30.0–36.0)
MCV: 99.1 fL (ref 80.0–100.0)
Platelets: 236 10*3/uL (ref 150–400)
RBC: 3.32 MIL/uL — ABNORMAL LOW (ref 4.22–5.81)
RDW: 15.6 % — ABNORMAL HIGH (ref 11.5–15.5)
WBC: 10.4 10*3/uL (ref 4.0–10.5)
nRBC: 0 % (ref 0.0–0.2)

## 2021-09-19 LAB — MAGNESIUM: Magnesium: 1.9 mg/dL (ref 1.7–2.4)

## 2021-09-19 LAB — APTT: aPTT: 76 seconds — ABNORMAL HIGH (ref 24–36)

## 2021-09-19 LAB — PHOSPHORUS: Phosphorus: 3.8 mg/dL (ref 2.5–4.6)

## 2021-09-19 LAB — HEPARIN LEVEL (UNFRACTIONATED): Heparin Unfractionated: >1.1 IU/mL — ABNORMAL HIGH (ref 0.30–0.70)

## 2021-09-19 MED ORDER — GLYCOPYRROLATE 0.2 MG/ML IJ SOLN
4.0000 ug/kg | Freq: Three times a day (TID) | INTRAMUSCULAR | Status: DC | PRN
  Administered 2021-09-19: 0.306 mg via INTRAVENOUS
  Filled 2021-09-19: qty 2

## 2021-09-19 MED ORDER — LORAZEPAM 2 MG/ML IJ SOLN
0.2500 mg | Freq: Two times a day (BID) | INTRAMUSCULAR | Status: AC
  Administered 2021-09-20: 0.25 mg via INTRAVENOUS
  Filled 2021-09-19: qty 1

## 2021-09-19 MED ORDER — HYDROMORPHONE HCL 1 MG/ML IJ SOLN
0.5000 mg | INTRAMUSCULAR | Status: DC | PRN
  Administered 2021-09-19 – 2021-09-20 (×2): 0.5 mg via INTRAVENOUS
  Filled 2021-09-19 (×2): qty 1

## 2021-09-19 MED ORDER — LORAZEPAM 2 MG/ML IJ SOLN: 0.2500 mg | Freq: Every day | INTRAMUSCULAR | Status: DC

## 2021-09-19 MED ORDER — HYDROMORPHONE HCL 1 MG/ML IJ SOLN
0.5000 mg | INTRAMUSCULAR | Status: DC | PRN
  Administered 2021-09-19: 0.5 mg via INTRAVENOUS
  Filled 2021-09-19: qty 1

## 2021-09-19 MED ORDER — ONDANSETRON HCL 4 MG/2ML IJ SOLN
4.0000 mg | Freq: Four times a day (QID) | INTRAMUSCULAR | Status: DC | PRN
  Administered 2021-09-19: 4 mg via INTRAVENOUS
  Filled 2021-09-19: qty 2

## 2021-09-19 MED ORDER — MORPHINE SULFATE (PF) 2 MG/ML IV SOLN: 2.0000 mg | INTRAVENOUS | Status: DC | PRN

## 2021-09-19 MED ORDER — LORAZEPAM 2 MG/ML IJ SOLN
0.2500 mg | Freq: Three times a day (TID) | INTRAMUSCULAR | Status: AC
  Administered 2021-09-19: 0.25 mg via INTRAVENOUS
  Filled 2021-09-19: qty 1

## 2021-09-19 NOTE — Plan of Care (Signed)
  Problem: Pain Managment: Goal: General experience of comfort will improve Outcome: Progressing   

## 2021-09-19 NOTE — TOC Progression Note (Signed)
Transition of Care (TOC) - Progression Note  ? ? ?Patient Details  ?Name: Johnny [name]. ?MRN: [id number] ?Date of Birth: [birth year] ? ?Transition of Care (TOC) CM/SW Contact  ?[place], LCSWA ?Phone Number: ?09/19/2021, 2:41 PM ? ?Clinical Narrative:    ? ? ?TOC consult for Residential hospice placement. CSW talked to [place] with [facility]. Marcene Brawn confirmed receiving the patient's information and reported they will follow the patient. ? ?No further TOC needs at this time.  ?  ?  ? ?Expected Discharge Plan and Services ?  ?  ?  ?  ?  ?                ?  ?  ?  ?  ?  ?  ?  ?  ?  ?  ? ? ?Social Determinants of Health (SDOH) Interventions ?  ? ?Readmission Risk Interventions ?   ? View : No data to display.  ?  ?  ?  ? ? ?

## 2021-09-19 NOTE — Progress Notes (Signed)
Neurology Progress Note ? ?Patient ID:  Johnny [name]. is a 71 y.o. male with a past medical history significant for hypertension, hyperlipidemia, end-stage renal disease on dialysis (Tuesday Thursday Saturday), coronary artery disease with cardiomyopathy and chronic diastolic congestive heart failure with preserved ejection fraction, polyneuropathy, peripheral vascular disease s/p right AKA (2020) and recent revascularization of the left lower extremity (April 2023), acute on chronic hypoxic and hypercarbic respiratory failure with concern for sepsis ? ?Initially consulted for: AMS ? ?Major interval events/Subjective: ?- Dialysis completed yesterday ?- EEG completed as below  ?- No acute events overnight, nursing reports exam has been stable no improvement but no major decline ?- BP readings appear to remain unreliable  ?- Cefepime discontinued this morning ? ?Exam: ?Vitals:  ? 09/19/21 0600 09/19/21 0700  ?BP: (!) 148/69 135/64  ?Pulse: 78 93  ?Resp: 16 20  ?Temp:    ?SpO2: 100% 100%  ? ?Gen: In bed, minimally interactive  ?Resp: BiPaP in place  ?Cardiac: Mild tachycardia on the monitor ?Abd: soft, nt, no reaction to palpation today ?Extremities: LLE atrophic, dressings in place on heel, R AKA ? ?Neuro: ?MS: Eyes open spontaneously, not following commands  ?CN: Right pupil > left but both reactive, right eye cataract, VOR suppressed, corenals intact, cough/gag deferred due to BiPAP in place ?Motor: [place] in the bilateral UE. Likely least 2/5 in the LE  ?Sensory: Responsive to stimulation in all 4  ?DTR: left patellar 2+ ? ?Pertinent Data: ? ?Basic Metabolic Panel: ?Recent Labs  ?Lab 09/16/21 ?1205 09/17/21 ?6599 09/18/21 ?3570 09/19/21 ?0111  ?NA 139 141 141 134*  ?K 3.9 4.0 4.2 3.4*  ?CL 100 104 104 93*  ?CO2 27 27 25 27   ?GLUCOSE 93 78 95 111*  ?BUN 15 21 35* 17  ?CREATININE 5.77* 7.39* 10.11* 5.65*  ?CALCIUM 8.4* 8.7* 9.0 8.8*  ?MG  --  2.0 2.2 1.9  ?PHOS  --  5.0* 7.1* 3.8  ? ? ?CBC: ?Recent Labs  ?Lab  09/16/21 ?0955 09/17/21 ?1779 09/18/21 ?3903 09/19/21 ?0111  ?WBC 15.2* 10.0 7.8 10.4  ?NEUTROABS  --  6.9  --   --   ?HGB 10.4* 9.1* 8.9* 9.8*  ?HCT 36.2* 32.0* 31.4* 32.9*  ?MCV 101.1* 102.6* 101.9* 99.1  ?PLT 302 248 251 236  ? ? ?Coagulation Studies: ?Recent Labs  ?  09/17/21 ?2254  ?LABPROT 22.0*  ?INR 1.9*  ? ?Lab Results  ?Component Value Date  ? TSH 1.000 09/16/2021  ? ?Ammonia 20 ? ?Lab Results  ?Component Value Date  ? [id number] 565 09/18/2021  ?   ?Thiamine pending ? ?Last VBG ? Latest Reference Range & Units 09/17/21 21:43  ?pH, Ven 7.25 - 7.43  7.25  ?pCO2, Ven 44 - 60 mmHg 54  ?pO2, Ven 32 - 45 mmHg 73 (H)  ?Acid-base deficit 0.0 - 2.0 mmol/L 4.2 (H)  ?Bicarbonate 20.0 - 28.0 mmol/L 23.7  ?O2 Saturation % 96.3  ?Patient temperature  37.0  ?Collection site  VEIN  ?(H): Data is abnormally high ? ?EEG personally reviewed:  ?No posterior dominant rhythm was seen. EEG showed continuous generalized 3 to 6 Hz theta-delta slowing. Sharp transients are noted in left and right frontotemporal region. Hyperventilation and photic stimulation were not performed.    ?This study is suggestive of moderate to severe diffuse encephalopathy, nonspecific etiology. No seizures or definite epileptiform discharges were seen throughout the recording. ? ?Impression: This is a 71 year old gentleman with multiple vascular risk factors as detailed above, with altered mental  status of unclear etiology.  From a toxic metabolic perspective, certainly cefepime may still be playing a role and will take 2 to 3 days minimum to clear, which may be expedited by further dialysis.  However work-up is otherwise been fairly unrevealing. I remain concerned about the possibility of intermittent seizures given the HCPOA's description of focal seizure with a jacksonian march on the left side followed by postevent weakness and gradually recovered 2 years ago, and her noting that this twitching comes back when he does not get clonazepam.  Routine  EEG did not clearly reveal interictal epileptogenic activity, but routine EEG is a poorly sensitive test.  ? ?Recommendations: ?- Continue keppra 250 mg QHS; this will replace home clonazapem and can be transitioned 1:1 to PO when patient able to tolerate PO ?- Continue 0.25 mg ativan TID today, then BID x 2 days, then QHS x 2 days and then stop  ?- Repeat ABG ?- Given persistently poor mental status, consider intubation ?- MRI brain w/o contrast when able from respiratory perspective  ?- Discussed with Dr. Quinn Axe, patient is appropriate to transfer [facility] for continuous EEG monitoring to rule out subclinical/intermittent seizures.  In that setting, holding Keppra to better characterize whether the patient does have underlying epilepsy may be useful ?- Plan of care discussed with Dr. Mal Misty by phone ?- Neurology will continue to follow at [facility] while the patient is here, and at [facility] wanted patient transferred ? ?Lesleigh Noe MD-PhD ?[facility] ?[phone]  ? ?CRITICAL CARE ?Performed by: Lorenza Chick ? ? ?Total critical care time: 45 minutes ? ?Critical care time was exclusive of separately billable procedures and treating other patients. ? ?Critical care was necessary to treat or prevent imminent or life-threatening deterioration. ? ?Critical care was time spent personally by me on the following activities: development of treatment plan with patient and/or surrogate as well as nursing, discussions with consultants, evaluation of patient's response to treatment, examination of patient, obtaining history from patient or surrogate, ordering and performing treatments and interventions, ordering and review of laboratory studies, ordering and review of radiographic studies, pulse oximetry and re-evaluation of patient's condition. ? ? ? ? ?

## 2021-09-19 NOTE — Progress Notes (Signed)
Patient placed on comfort care measures.  IV fluids and heparin turned off, respiratory therapy notified to d/c bipap. ?

## 2021-09-19 NOTE — Consult Note (Signed)
ANTICOAGULATION CONSULT NOTE  ? ?Pharmacy Consult for heparin ?Indication:  PVD previously on apixaban ? ?Allergies  ?Allergen Reactions  ? Shellfish Allergy Anaphylaxis  ? Other   ? ? ?Patient Measurements: ?Height: 5\' 8"  (172.7 cm) ?Weight: 76.7 kg (169 lb 1.5 oz) ?IBW/kg (Calculated) : 68.4 ?Heparin Dosing Weight: 78.4 kg ? ?Vital Signs: ?Temp: 98.2 ?F (36.8 ?C) (05/13 2100) ?Temp Source: Axillary (05/13 2100) ?BP: 159/64 (05/13 2100) ?Pulse Rate: 95 (05/13 2100) ? ?Labs: ?Recent Labs  ?  09/16/21 ?1205 09/17/21 ?1478 09/17/21 ?2956 09/17/21 ?2254 09/18/21 ?2130 09/18/21 ?1724 09/19/21 ?0111  ?HGB  --  9.1*  --   --  8.9*  --  9.8*  ?HCT  --  32.0*  --   --  31.4*  --  32.9*  ?PLT  --  248  --   --  251  --  236  ?APTT  --   --    < > 45* 123* 98* 76*  ?LABPROT  --   --   --  22.0*  --   --   --   ?INR  --   --   --  1.9*  --   --   --   ?HEPARINUNFRC  --   --   --  >1.10* >1.10*  --  >1.10*  ?CREATININE 5.77* 7.39*  --   --  10.11*  --  5.65*  ?TROPONINIHS 34*  --   --   --   --   --   --   ? < > = values in this interval not displayed.  ? ? ? ?Estimated Creatinine Clearance: 11.4 mL/min (A) (by C-G formula based on SCr of 5.65 mg/dL (H)). ? ? ?Medical History: ?Past Medical History:  ?Diagnosis Date  ? Anemia   ? Anxiety   ? CHF (congestive heart failure) ([place])   ? Chronic kidney disease   ? esrd dialysis m/w/f  ? Dialysis patient [facility])   ? M, W, F  ? Gout   ? Hyperlipidemia   ? Hypertension   ? Myocardial infarction [facility] 2010  ? 10 years ago  ? Neuromuscular disorder ([place]) 2020  ? 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  ? Peripheral vascular disease ([place])   ? S/P BKA (below knee amputation), right ([place])   ? Wears dentures   ? full upper and lower  ? ? ?Medications:  ?Medications Prior to Admission  ?Medication Sig Dispense Refill Last Dose  ? allopurinol (ZYLOPRIM) 100 MG tablet Take 100 mg by mouth every evening.   09/15/2021 at 2000  ? apixaban (ELIQUIS) 5 MG TABS tablet Take 1 tablet (5 mg total) by mouth 2  (two) times daily. 60 tablet 5 09/15/2021 at 2000  ? aspirin EC 81 MG tablet Take 1 tablet (81 mg total) by mouth daily. 150 tablet 2 09/15/2021  ? atorvastatin (LIPITOR) 10 MG tablet Take 10 mg by mouth at bedtime.   09/15/2021 at 2000  ? carvedilol (COREG) 3.125 MG tablet Take 3.125 mg by mouth 2 (two) times daily.   09/15/2021 at 2000  ? docusate sodium (COLACE) 100 MG capsule Take 1 capsule (100 mg total) by mouth 2 (two) times daily. 60 capsule 0 09/15/2021 at 2000  ? gabapentin (NEURONTIN) 100 MG capsule Take 2 capsules (200 mg total) by mouth 2 (two) times daily. After dialysis session (Patient taking differently: Take 100 mg by mouth See admin instructions. Take 100 mg twice a day and on MON. Wed and Friday take and  addition 100 mg in the evening after dialysis)   09/15/2021 at 2000  ? losartan (COZAAR) 100 MG tablet Hold until followup with outpatient doctor due to intermittently low blood pressure. (Patient taking differently: Take 100 mg by mouth every evening.)   09/15/2021 at 2000  ? oxyCODONE-acetaminophen (PERCOCET/ROXICET) 5-325 MG tablet Take 2 tablets by mouth every 6 (six) hours as needed. 20 tablet 0 09/15/2021 at 2000  ? sevelamer carbonate (RENVELA) 800 MG tablet Take 2,400 mg by mouth 3 (three) times daily with meals.   09/15/2021  ? vitamin C (ASCORBIC ACID) 500 MG tablet Take 500 mg by mouth daily.   09/15/2021  ? clonazePAM (KLONOPIN) 0.5 MG tablet Take 1 tablet (0.5 mg total) by mouth as needed. 10 tablet 0 prn  ? folic acid-vitamin b complex-vitamin c-selenium-zinc (DIALYVITE) 3 MG TABS tablet Take 1 tablet by mouth daily.      ? ondansetron (ZOFRAN-ODT) 4 MG disintegrating tablet Take 1 tablet (4 mg total) by mouth every 6 (six) hours as needed for nausea or vomiting. 20 tablet 0 prn  ? polyethylene glycol (MIRALAX / GLYCOLAX) 17 g packet Take 17 g by mouth daily as needed for mild constipation. 14 each 0 prn  ? ?Scheduled:  ? aspirin EC  81 mg Oral Daily  ? atorvastatin  10 mg Oral QHS  ?  chlorhexidine  15 mL Mouth Rinse BID  ? Chlorhexidine Gluconate Cloth  6 each Topical Daily  ? Chlorhexidine Gluconate Cloth  6 each Topical Q0600  ? epoetin (EPOGEN/PROCRIT) injection  6,000 Units Intravenous Q T,Th,Sa-HD  ? LORazepam  0.25 mg Intravenous Q8H  ? mouth rinse  15 mL Mouth Rinse q12n4p  ? sevelamer carbonate  2,400 mg Oral TID WC  ? ?Infusions:  ? sodium chloride    ? sodium chloride    ? ceFEPime (MAXIPIME) IV 200 mL/hr at 09/18/21 1800  ? dextrose Stopped (09/18/21 1716)  ? heparin 800 Units/hr (09/19/21 0009)  ? levETIRAcetam Stopped (09/18/21 1735)  ? vancomycin 1,000 mg (09/18/21 1831)  ? ?PRN: sodium chloride, sodium chloride, acetaminophen, alteplase, heparin, heparin, lidocaine (PF), lidocaine-prilocaine, oxyCODONE-acetaminophen, pentafluoroprop-tetrafluoroeth, polyethylene glycol, prochlorperazine ?Anti-infectives (From admission, onward)  ? ? Start     Dose/Rate Route Frequency Ordered Stop  ? 09/18/21 1800  vancomycin (VANCOCIN) IVPB 1000 mg/200 mL premix       ? 1,000 mg ?200 mL/hr over 60 Minutes Intravenous Every T-Th-Sa (1800) 09/16/21 1854    ? 09/17/21 1300  ceFEPIme (MAXIPIME) 1 g in sodium chloride 0.9 % 100 mL IVPB       ? 1 g ?200 mL/hr over 30 Minutes Intravenous Every 24 hours 09/16/21 1644    ? 09/16/21 1645  vancomycin (VANCOREADY) IVPB 750 mg/150 mL  Status:  Discontinued       ? 750 mg ?150 mL/hr over 60 Minutes Intravenous Every 24 hours 09/16/21 1644 09/16/21 1822  ? 09/16/21 1500  vancomycin (VANCOCIN) IVPB 1000 mg/200 mL premix       ? 1,000 mg ?200 mL/hr over 60 Minutes Intravenous  Once 09/16/21 1446 09/16/21 1735  ? 09/16/21 1245  vancomycin (VANCOCIN) IVPB 1000 mg/200 mL premix       ? 1,000 mg ?200 mL/hr over 60 Minutes Intravenous  Once 09/16/21 1244 09/16/21 1504  ? 09/16/21 1245  ceFEPIme (MAXIPIME) 2 g in sodium chloride 0.9 % 100 mL IVPB       ? 2 g ?200 mL/hr over 30 Minutes Intravenous  Once 09/16/21 1244 09/16/21 1334  ? ?  ? ? ?  Assessment: ?Pharmacy  consulted to start heparin while holding apixaban. Last dose apixaban 5/11 PM. Will use aPTT to monitor heparin as apixaban can falsely elevate heparin levels.  ? ?5/13 0753  aPTT 123/ HL >1.10   Supratherapeutic ?5/13 1724 aPTT 98  Therapeutic ?5/14 0111 aPTT 76 therapeutic HL > 1.1 ? ? ?Goal of Therapy:  ?aPTT 66-102 ?Heparin level 0.3-0.7 units/ml ?Monitor platelets by anticoagulation protocol: Yes ?  ?Plan:  ?aPTT is therapeutic. Continue heparin at 800 units/hr. Re-check aPTT/Heparin level and CBC with AM labs. Switch to heparin level once aPTT and heparin level correlate.  ? ? ?Oswald Hillock, PharmD ?09/19/2021,2:38 AM ? ? ? ?

## 2021-09-19 NOTE — Progress Notes (Signed)
Report given to receiving nurse on 1C, patient transferred in hospital bed to new room. ?

## 2021-09-19 NOTE — Progress Notes (Signed)
Pt removed from BIPAP and placed on comfort care. ?

## 2021-09-19 NOTE — Progress Notes (Addendum)
? ? ? ?Progress Note  ? ? ?Johnny [name].  TKZ:[id number] DOB: 1951/02/26  DOA: 09/16/2021 ?PCP: Denton Lank, MD  ? ? ? ? ?Brief Narrative:  ? ? ?Medical records reviewed and are as summarized below: ? ?Johnny [name]. is a 71 y.o. male with medical history significant for ESRD on HD TTS, peripheral artery disease status post right AKA on aspirin and Eliquis, recent revascularization of left lower extremity status post critical limb ischemia in April 2023, hypertension, hyperlipidemia, gout, polyneuropathy, HFpEF 70 to 75%, recent admission for left lower extremity cellulitis.  He was sent from the outpatient hemodialysis center to the emergency room because of altered mental status and hypoxia with low oxygen saturation in the 70s. ? ?Work-up revealed severe sepsis secondary to pneumonia complicated by acute hypoxic respiratory failure and acute metabolic encephalopathy.. ? ? ? ? ?Assessment/Plan:  ? ?Principal Problem: ?  Severe sepsis ([place]) ?Active Problems: ?  Acute respiratory failure with hypoxia and hypercarbia (HCC) ?  Acute respiratory failure with hypoxia and hypercapnia (HCC) ?  CAP (community acquired pneumonia) ?  Acute metabolic encephalopathy ?  Decubitus ulcer of heel, left, unstageable ([place]) ? ? ? ?Body mass index is 25.71 kg/m?. ? 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 ? ?Plan ? ?Plan of care was discussed with Dr. Curly Shores, neurologist.  Initially, plan was to transfer patient to [facility] for  continuous video EEG monitoring.  Because patient is on BiPAP for acute respiratory failure, plan was to intubate the patient and place him on mechanical ventilation for transfer.  This was discussed with Ms. [name], HPOA, who said that patient is DNR.  She said [name] had previously told her that he was tired and did not want any intubation, mechanical ventilation and CPR.  She said funeral arrangements had already been made for [name]  She requested that patient be placed on comfort measures.  She wants treatment including BiPAP and hemodialysis to be discontinued.  She understands that patient may expire after discontinuation of BiPAP and hemodialysis. ?Comfort measures have been ordered.  Dr. Curly Shores (neurologist), Dr. Theador Hawthorne (nephrologist) and Claiborne Billings, RN, have been updated. ?Transfer to [place]. ? ? ? ?Diet Order   ? ?       ?  Diet NPO time specified  Diet effective now       ?  ? ?  ?  ? ?  ? ? ? ? ? ? ? ? ? ? ? ?Consultants: ?Intensivist, nephrologist, neurologist ? ?Procedures: ?None ? ? ? ?Medications:  ? ? chlorhexidine  15 mL Mouth Rinse BID  ? Chlorhexidine Gluconate Cloth  6 each Topical Daily  ? Chlorhexidine Gluconate Cloth  6 each Topical Q0600  ? LORazepam  0.25 mg Intravenous Q8H  ? Followed by  ? [START ON 09/20/2021] LORazepam  0.25 mg Intravenous Q12H  ? Followed by  ? [START ON 09/21/2021] LORazepam  0.25 mg Intravenous QHS  ? mouth rinse  15 mL Mouth Rinse q12n4p  ? ?Continuous Infusions: ? levETIRAcetam Stopped (09/18/21 1735)  ? ? ? ?Anti-infectives (From admission, onward)  ? ?  Start     Dose/Rate Route Frequency Ordered Stop  ? 09/18/21 1800  vancomycin (VANCOCIN) IVPB 1000 mg/200 mL premix  Status:  Discontinued       ? 1,000 mg ?200 mL/hr over 60 Minutes Intravenous Every T-Th-Sa (1800) 09/16/21 1854 09/19/21 0901  ? 09/17/21 1300  ceFEPIme (MAXIPIME) 1 g in sodium chloride 0.9 % 100 mL IVPB  Status:  Discontinued       ? 1 g ?200 mL/hr over 30 Minutes Intravenous  Every 24 hours 09/16/21 1644 09/19/21 1031  ? 09/16/21 1645  vancomycin (VANCOREADY) IVPB 750 mg/150 mL  Status:  Discontinued       ? 750 mg ?150 mL/hr over 60 Minutes Intravenous Every 24 hours 09/16/21 1644 09/16/21 1822  ? 09/16/21 1500  vancomycin (VANCOCIN) IVPB 1000 mg/200 mL premix       ? 1,000 mg ?200 mL/hr over 60 Minutes Intravenous  Once 09/16/21 1446 09/16/21 1735  ? 09/16/21 1245  vancomycin (VANCOCIN) IVPB 1000 mg/200 mL premix       ? 1,000 mg ?200 mL/hr over 60 Minutes Intravenous  Once 09/16/21 1244 09/16/21 1504  ? 09/16/21 1245  ceFEPIme (MAXIPIME) 2 g in sodium chloride 0.9 % 100 mL IVPB       ? 2 g ?200 mL/hr over 30 Minutes Intravenous  Once 09/16/21 1244 09/16/21 1334  ? ?  ? ? ? ? ? ? ? ? ? ?Family Communication/Anticipated D/C date and plan/Code Status  ? ?DVT prophylaxis:  ? ? ?  Code Status: DNR ? ?Family Communication: Plan discussed with Ms. [name] [facility]) ?Disposition Plan: Possible discharge to home in 3 to 4 days ? ? ?Status is: Inpatient ?Remains inpatient appropriate because: Awaiting discharge to hospice house ? ? ? ? ? ? ?Subjective:  ? ?Interval events noted.  Patient is lethargic and confused and unable to provide any history ? ?Objective:  ? ? ?Vitals:  ? 09/19/21 1000 09/19/21 1006 09/19/21 1100 09/19/21 1200  ?BP: (!) 179/87 (!) 163/83 (!) 178/77 (!) 152/80  ?Pulse: 96  98 91  ?Resp: 20 16 18 14   ?Temp:      ?TempSrc:      ?SpO2: 100%  100% 100%  ?Weight:      ?Height:      ? ?No data found. ? ? ?Intake/Output Summary (Last 24 hours) at 09/19/2021 1402 ?Last data filed at 09/19/2021 1345 ?Gross per 24 hour  ?Intake 1229.68 ml  ?Output 1500 ml  ?Net -270.32 ml  ? ?Filed Weights  ? 09/16/21 2353 09/18/21 1414 09/18/21 1930  ?Weight: 78.4 kg 78.5 kg 76.7 kg  ? ? ?Exam: ? ?GEN: NAD ?SKIN: Warm and dry ?EYES: No pallor or icterus.  Unstageable left heel decubitus ulcer.  Stage II left buttock decubitus ulcer ?ENT: MMM ?CV: RRR ?PULM: CTA B ?ABD: soft, ND, NT, +BS ?CNS:  Lethargic, unarousable ?EXT: No edema or tenderness ? ? ? ? ? ? ?Pressure Injury 09/17/21 Buttocks Left Stage 2 -  Partial thickness loss of dermis presenting as a shallow open injury with a red, pink wound bed without slough. (Active)  ?09/17/21 0023  ?Location: Buttocks  ?Location Orientation: Left  ?Staging: Stage 2 -  Partial thickness loss of dermis presenting as a shallow open injury with a red, pink wound bed without slough.  ?Wound Description (Comments):   ?Present on Admission: Yes  ?Dressing Type Foam - Lift dressing to assess site every shift 09/18/21 2100  ? ? ? ?Data Reviewed:  ? ?  I have personally reviewed following labs and imaging studies: ? ?Labs: ?Labs show the following:  ? ?Basic Metabolic Panel: ?Recent Labs  ?Lab 09/16/21 ?1205 09/17/21 ?1638 09/18/21 ?4536 09/19/21 ?0111  ?NA 139 141 141 134*  ?K 3.9 4.0 4.2 3.4*  ?CL 100 104 104 93*  ?CO2 27 27 25 27   ?GLUCOSE 93 78 95 111*  ?BUN 15 21 35* 17  ?CREATININE 5.77* 7.39* 10.11* 5.65*  ?CALCIUM 8.4* 8.7* 9.0 8.8*  ?MG  --  2.0 2.2 1.9  ?PHOS  --  5.0* 7.1* 3.8  ? ?GFR ?Estimated Creatinine Clearance: 11.4 mL/min (A) (by C-G formula based on SCr of 5.65 mg/dL (H)). ?Liver Function Tests: ?Recent Labs  ?Lab 09/16/21 ?1205 09/17/21 ?4680  ?AST 16 17  ?ALT 6 6  ?ALKPHOS 55 59  ?BILITOT 0.5 0.3  ?PROT 7.6 7.1  ?ALBUMIN 3.2* 3.3*  ? ?No results for input(s): LIPASE, AMYLASE in the last 168 hours. ?Recent Labs  ?Lab 09/16/21 ?1021 09/18/21 ?0955  ?AMMONIA 21 20  ? ?Coagulation profile ?Recent Labs  ?Lab 09/17/21 ?2254  ?INR 1.9*  ? ? ?CBC: ?Recent Labs  ?Lab 09/16/21 ?0955 09/17/21 ?3212 09/18/21 ?2482 09/19/21 ?0111  ?WBC 15.2* 10.0 7.8 10.4  ?NEUTROABS  --  6.9  --   --   ?HGB 10.4* 9.1* 8.9* 9.8*  ?HCT 36.2* 32.0* 31.4* 32.9*  ?MCV 101.1* 102.6* 101.9* 99.1  ?PLT 302 248 251 236  ? ?Cardiac Enzymes: ?No results for input(s): CKTOTAL, CKMB, CKMBINDEX, TROPONINI in the last 168 hours. ?BNP (last 3 results) ?No results for input(s): PROBNP in the last  8760 hours. ?CBG: ?Recent Labs  ?Lab 09/18/21 ?1936 09/18/21 ?2345 09/19/21 ?0310 09/19/21 ?5003 09/19/21 ?1105  ?GLUCAP 106* 107* 104* 114* 109*  ? ?D-Dimer: ?No results for input(s): DDIMER in the last 72 hours.

## 2021-09-19 NOTE — Plan of Care (Signed)
Continuing with plan of care. 

## 2021-09-19 NOTE — Progress Notes (Signed)
[name].  MRN: [id number]  DOB/AGE: [birth year] 71 y.o.  Primary Care Physician:Patel, Maralyn Sago, MD  Admit date: 09/16/2021  Chief Complaint:  Chief Complaint  Patient presents with   Altered Mental Status    Patient was at dialysis and had sudden onset confusion around 0900; EMS reported he was only oriented to self; Upon arrival here, he has a GCS of 14, oriented to self and place, but does not know why he is here and has some trouble answering questions; Only complaint is his chronic pain    S-Pt presented on  09/16/2021 with  Chief Complaint  Patient presents with   Altered Mental Status    Patient was at dialysis and had sudden onset confusion around 0900; EMS reported he was only oriented to self; Upon arrival here, he has a GCS of 14, oriented to self and place, but does not know why he is here and has some trouble answering questions; Only complaint is his chronic pain  . Patient is a 71 year old African-American male with a past medical history of end-stage renal disease on hemodialysis, peripheral vascular disease s/p right above-knee amputation, hypertension, hyperlipidemia, peripheral vascular disease who was sent to the ER yesterday with a chief complaint of altered mental status.  Patient is well-known to our practice from previous admissions.  History of present illness date back to yesterday when patient went to his regular scheduled dialysis and at the center patient was found to be confused and hypoxic EMS was called. Nephrology was consulted for comanagement of dialysis patient   Patient was seen today in ICU Patient remains confused and somnolent Patient remains on BiPAP Patient offers no new specific physical complaints.     Medications    aspirin EC  81 mg Oral Daily   atorvastatin  10 mg Oral QHS   chlorhexidine  15 mL Mouth Rinse BID   Chlorhexidine Gluconate Cloth  6 each Topical Daily   Chlorhexidine Gluconate Cloth  6 each Topical Q0600    epoetin (EPOGEN/PROCRIT) injection  6,000 Units Intravenous Q T,Th,Sa-HD   LORazepam  0.25 mg Intravenous Q8H   mouth rinse  15 mL Mouth Rinse q12n4p   sevelamer carbonate  2,400 mg Oral TID WC         ROSUnable to get any data   Physical Exam: Vital signs in last 24 hours: Temp:  [96.4 F (35.8 C)-98.2 F (36.8 C)] 98.2 F (36.8 C) (05/14 0300) Pulse Rate:  [67-100] 93 (05/14 0700) Resp:  [11-27] 20 (05/14 0700) BP: (91-172)/(48-81) 135/64 (05/14 0700) SpO2:  [95 %-100 %] 100 % (05/14 0700) FiO2 (%):  [30 %] 30 % (05/14 0700) Weight:  [76.7 kg-78.5 kg] 76.7 kg (05/13 1930) Weight change:  Last BM Date :  (PTA)  Intake/Output from previous day: 05/13 0701 - 05/14 0700 In: 1214.6 [I.V.:811.5; IV Piggyback:403.1] Out: 1500  No intake/output data recorded.   Physical Exam:  General- pt is Lethargic, critically ill-appearing  HEENT head is atraumatic normocephalic BiPAP in situ  Resp-mild acute REsp distress,  Rhonchi Present  CVS- S1S2 regular in rate and rhythm  GIT- BS+, soft, Non tender , Non distended  EXT- No LE Edema,  No Cyanosis  Right AKA  Access- Right-sided tunneled catheter  Lab Results:  CBC  Recent Labs    09/18/21 0551 09/19/21 0111  WBC 7.8 10.4  HGB 8.9* 9.8*  HCT 31.4* 32.9*  PLT 251 236    BMET  Recent Labs    09/18/21 0551 09/19/21  0111  NA 141 134*  K 4.2 3.4*  CL 104 93*  CO2 25 27  GLUCOSE 95 111*  BUN 35* 17  CREATININE 10.11* 5.65*  CALCIUM 9.0 8.8*      Most recent Creatinine trend  Lab Results  Component Value Date   CREATININE 5.65 (H) 09/19/2021   CREATININE 10.11 (H) 09/18/2021   CREATININE 7.39 (H) 09/17/2021      MICRO   Recent Results (from the past 240 hour(s))  Blood culture (routine x 2)     Status: None (Preliminary result)   Collection Time: 09/16/21 12:59 PM   Specimen: BLOOD  Result Value Ref Range Status   Specimen Description BLOOD RIGHT FOREARM  Final   Special Requests  BOTTLES DRAWN AEROBIC AND ANAEROBIC BCLV  Final   Culture   Final    NO GROWTH 3 DAYS Performed at [facility], [address], [place], [place] [address]    Report Status PENDING  Incomplete  Resp Panel by RT-PCR (Flu A&B, Covid) Nasopharyngeal Swab     Status: None   Collection Time: 09/16/21 12:59 PM   Specimen: Nasopharyngeal Swab; Nasopharyngeal(NP) swabs in vial transport medium  Result Value Ref Range Status   SARS Coronavirus 2 by RT PCR NEGATIVE NEGATIVE Final    Comment: (NOTE) SARS-CoV-2 target nucleic acids are NOT DETECTED.  The SARS-CoV-2 RNA is generally detectable in upper respiratory specimens during the acute phase of infection. The lowest concentration of SARS-CoV-2 viral copies this assay can detect is 138 copies/mL. A negative result does not preclude SARS-Cov-2 infection and should not be used as the sole basis for treatment or other patient management decisions. A negative result may occur with  improper specimen collection/handling, submission of specimen other than nasopharyngeal swab, presence of viral mutation(s) within the areas targeted by this assay, and inadequate number of viral copies(<138 copies/mL). A negative result must be combined with clinical observations, patient history, and epidemiological information. The expected result is Negative.  Fact Sheet for Patients:  BloggerCourse.com  Fact Sheet for Healthcare Providers:  SeriousBroker.it  This test is no t yet approved or cleared by the [place] FDA and  has been authorized for detection and/or diagnosis of SARS-CoV-2 by FDA under an Emergency Use Authorization (EUA). This EUA will remain  in effect (meaning this test can be used) for the duration of the COVID-19 declaration under Section 564(b)(1) of the Act, 21 U.S.C.section 360bbb-3(b)(1), unless the authorization is terminated  or revoked sooner.       Influenza A by  PCR NEGATIVE NEGATIVE Final   Influenza B by PCR NEGATIVE NEGATIVE Final    Comment: (NOTE) The Xpert Xpress SARS-CoV-2/FLU/RSV plus assay is intended as an aid in the diagnosis of influenza from Nasopharyngeal swab specimens and should not be used as a sole basis for treatment. Nasal washings and aspirates are unacceptable for Xpert Xpress SARS-CoV-2/FLU/RSV testing.  Fact Sheet for Patients: BloggerCourse.com  Fact Sheet for Healthcare Providers: SeriousBroker.it  This test is not yet approved or cleared by the [place] FDA and has been authorized for detection and/or diagnosis of SARS-CoV-2 by FDA under an Emergency Use Authorization (EUA). This EUA will remain in effect (meaning this test can be used) for the duration of the COVID-19 declaration under Section 564(b)(1) of the Act, 21 U.S.C. section 360bbb-3(b)(1), unless the authorization is terminated or revoked.  Performed at [facility], [address] [place], [place] [address]   MRSA Next Gen by PCR, Nasal  Status: None   Collection Time: 09/17/21 12:38 AM   Specimen: Nasal Mucosa; Nasal Swab  Result Value Ref Range Status   MRSA by PCR Next Gen NOT DETECTED NOT DETECTED Final    Comment: (NOTE) The GeneXpert MRSA Assay (FDA approved for NASAL specimens only), is one component of a comprehensive MRSA colonization surveillance program. It is not intended to diagnose MRSA infection nor to guide or monitor treatment for MRSA infections. Test performance is not FDA approved in patients less than 57 years old. Performed at [facility], [address] [place], [place] [address]   Culture, blood (Routine X 2) w Reflex to ID Panel     Status: None (Preliminary result)   Collection Time: 09/18/21 10:47 AM   Specimen: BLOOD LEFT HAND  Result Value Ref Range Status   Specimen Description BLOOD LEFT HAND  Final   Special Requests   Final     BOTTLES DRAWN AEROBIC AND ANAEROBIC Blood Culture results may not be optimal due to an inadequate volume of blood received in culture bottles   Culture   Final    NO GROWTH < 24 HOURS Performed at [facility], [address] [place], [place] [address]    Report Status PENDING  Incomplete         Impression:   1)Renal    End-stage renal disease Patient is on hemodialysis Patient is on Monday Wednesday Friday schedule Patient could not be dialyzed on Friday secondary to transportation issues Patient was then dialyzed on Thursday We will dialyze patient today   2)Hypotension Patient is currently on midodrine   3)Anemia of chronic disease     Latest Ref Rng & Units 09/19/2021    1:11 AM 09/18/2021    5:51 AM 09/17/2021    4:37 AM  CBC  WBC 4.0 - 10.5 K/uL 10.4   7.8   10.0    Hemoglobin 13.0 - 17.0 g/dL 9.8   8.9   9.1    Hematocrit 39.0 - 52.0 % 32.9   31.4   32.0    Platelets 150 - 400 K/uL 236   251   248         HGb Is at goal (9--11) on Epogen dose  4) Secondary hyperparathyroidism -CKD Mineral-Bone Disorder    Lab Results  Component Value Date   PTH 354 (H) 09/07/2021   CALCIUM 8.8 (L) 09/19/2021   CAION 0.98 (L) 07/01/2021   PHOS 3.8 09/19/2021    Secondary Hyperparathyroidism Present.  Phosphorus is not at goal. Patient is on binders  5)Sepsis On broad spectrum Clinically stable   6) Electrolytes      Latest Ref Rng & Units 09/19/2021    1:11 AM 09/18/2021    5:51 AM 09/17/2021    4:37 AM  BMP  Glucose 70 - 99 mg/dL 147   95   78    BUN 8 - 23 mg/dL 17   35   21    Creatinine 0.61 - 1.24 mg/dL 8.29   56.21   3.08    Sodium 135 - 145 mmol/L 134   141   141    Potassium 3.5 - 5.1 mmol/L 3.4   4.2   4.0    Chloride 98 - 111 mmol/L 93   104   104    CO2 22 - 32 mmol/L 27   25   27     Calcium 8.9 - 10.3 mg/dL 8.8   9.0   8.7  Sodium Hyponatremia Secondary to ESRD   Potassium Hypokalemia We will  replete    7)Acid base    Co2 at goal  8)Acute hypoxic and hypercapnic respiratory failure Patient is currently on BiPAP   9)Altered mental status Patient metabolic encephalopathy unlikely to be from uremia Neurology has been following the patient Patient had EEG The plan was to ask for a video EEG after intubation   10)Social Patient had poor prognosis. Patient healthcare power of attorney and team discussed patient current clinical conditions and after discussion patient was transition to comfort care as per his wishes   Plan:   Patient is currently on comfort care     Dayzha Pogosyan s Wolfgang Phoenix 09/19/2021, 9:13 AM

## 2021-09-20 NOTE — TOC Progression Note (Signed)
Transition of Care (TOC) - Progression Note  ? ? ?Patient Details  ?Name: Johnny [name]. ?MRN: [id number] ?Date of Birth: [birth year] ? ?Transition of Care (TOC) CM/SW Contact  ?Pete Pelt, RN ?Phone Number: ?09/20/2021, 2:16 PM ? ?Clinical Narrative:   [facility] aware, hospice home referral made ? ? ? ? ?  ?  ? ?Expected Discharge Plan and Services ?  ?  ?  ?  ?  ?                ?  ?  ?  ?  ?  ?  ?  ?  ?  ?  ? ? ?Social Determinants of Health (SDOH) Interventions ?  ? ?Readmission Risk Interventions ?   ? View : No data to display.  ?  ?  ?  ? ? ?

## 2021-09-20 NOTE — Progress Notes (Signed)
? ? ? ?Progress Note  ? ? ?Johnny [name].  WRU:[id number] DOB: 1951-03-05  DOA: 09/16/2021 ?PCP: Denton Lank, MD  ? ? ? ? ?Brief Narrative:  ? ? ?Medical records reviewed and are as summarized below: ? ?Johnny [name]. is a 71 y.o. male with medical history significant for ESRD on HD TTS, peripheral artery disease status post right AKA on aspirin and Eliquis, recent revascularization of left lower extremity status post critical limb ischemia in April 2023, hypertension, hyperlipidemia, gout, polyneuropathy, HFpEF 70 to 75%, recent admission for left lower extremity cellulitis.  He was sent from the outpatient hemodialysis center to the emergency room because of altered mental status and hypoxia with low oxygen saturation in the 70s. ? ?Work-up revealed severe sepsis secondary to pneumonia complicated by acute hypoxic respiratory failure and acute metabolic encephalopathy.. ? ? ? ? ?Assessment/Plan:  ? ?Principal Problem: ?  Severe sepsis ([place]) ?Active Problems: ?  Acute respiratory failure with hypoxia and hypercarbia (HCC) ?  Acute respiratory failure with hypoxia and hypercapnia (HCC) ?  CAP (community acquired pneumonia) ?  Acute metabolic encephalopathy ?  Decubitus ulcer of heel, left, unstageable ([place]) ? ? ? ?Body mass index is 25.71 kg/m?. ? 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 ? ?Plan ? ?Continue comfort measures. ?Plan of care was discussed with Santiago Glad with hospice team.  Patient will be discharged to hospice home  tomorrow. ?Plan of care was discussed with [name], [place]. ? ? ?Diet Order   ? ?       ?  Diet NPO time specified  Diet effective now       ?  ? ?  ?  ? ?  ? ? ? ? ? ? ? ? ? ? ? ?Consultants: ?Intensivist, nephrologist, neurologist ? ?Procedures: ?None ? ? ? ?Medications:  ? ? chlorhexidine  15 mL Mouth Rinse BID  ? Chlorhexidine Gluconate Cloth  6 each Topical Daily  ? Chlorhexidine Gluconate Cloth  6 each Topical Q0600  ? LORazepam  0.25 mg Intravenous Q12H  ? Followed by  ? [START ON 09/21/2021] LORazepam  0.25 mg Intravenous QHS  ? mouth rinse  15 mL Mouth Rinse q12n4p  ? ?Continuous Infusions: ? levETIRAcetam Stopped (09/19/21 1800)  ? ? ? ?Anti-infectives (From admission, onward)  ? ? Start     Dose/Rate Route Frequency Ordered Stop  ? 09/18/21 1800  vancomycin (VANCOCIN) IVPB 1000 mg/200 mL premix  Status:  Discontinued       ? 1,000 mg ?200 mL/hr over 60 Minutes Intravenous Every T-Th-Sa (1800) 09/16/21 1854 09/19/21 0901  ? 09/17/21 1300  ceFEPIme (MAXIPIME) 1 g in sodium chloride 0.9 % 100 mL IVPB  Status:  Discontinued       ? 1 g ?200 mL/hr over 30 Minutes Intravenous Every 24 hours 09/16/21 1644 09/19/21 1031  ? 09/16/21 1645  vancomycin (VANCOREADY) IVPB 750 mg/150 mL  Status:  Discontinued       ? 750 mg ?150 mL/hr over 60 Minutes Intravenous Every 24 hours 09/16/21 1644 09/16/21 1822  ? 09/16/21 1500  vancomycin (VANCOCIN) IVPB 1000 mg/200 mL premix       ?  1,000 mg ?200 mL/hr over 60 Minutes Intravenous  Once 09/16/21 1446 09/16/21 1735  ? 09/16/21 1245  vancomycin (VANCOCIN) IVPB 1000 mg/200 mL premix       ? 1,000 mg ?200 mL/hr over 60 Minutes Intravenous  Once 09/16/21 1244 09/16/21 1504  ? 09/16/21 1245  ceFEPIme (MAXIPIME) 2 g in sodium chloride 0.9 % 100 mL IVPB       ? 2 g ?200 mL/hr over 30 Minutes Intravenous  Once 09/16/21 1244 09/16/21 1334  ? ?  ? ? ? ? ? ? ? ? ? ?Family Communication/Anticipated D/C date and plan/Code Status  ? ?DVT prophylaxis:  ? ? ?  Code Status: DNR ? ?Family  Communication: Plan discussed with Ms. [name]) ?Disposition Plan: Possible discharge to hospice house ? ? ?Status is: Inpatient ?Remains inpatient appropriate because: Awaiting placement to hospice ? ? ? ? ? ? ?Subjective:  ? ?Interval events noted.  Patient is unresponsive unable to provide any history. ? ?Objective:  ? ? ?Vitals:  ? 09/19/21 1100 09/19/21 1200 09/19/21 2018 09/20/21 0803  ?BP: (!) 178/77 (!) 152/80 (!) 151/76 (!) 169/97  ?Pulse: 98 91 98 (!) 106  ?Resp: 18 14 20 18   ?Temp:    98 ?F (36.7 ?C)  ?TempSrc:      ?SpO2: 100% 100% 97% 99%  ?Weight:      ?Height:      ? ?No data found. ? ?No intake or output data in the 24 hours ending 09/20/21 1645 ? ?Filed Weights  ? 09/16/21 2353 09/18/21 1414 09/18/21 1930  ?Weight: 78.4 kg 78.5 kg 76.7 kg  ? ? ?Exam: ? ?GEN: NAD ?SKIN: Warm and dry ?EYES: No acute abnormality ?ENT: Dry mucous membranes ?CV: RRR ?PULM: No wheezing or rales heard ?ABD: Obese ?CNS: Unresponsive ?EXT: Right AKA.  No edema ? ? ? ? ? ? ? ?Data Reviewed:  ? ?I have personally reviewed following labs and imaging studies: ? ?Labs: ?Labs show the following:  ? ?Basic Metabolic Panel: ?Recent Labs  ?Lab 09/16/21 ?1205 09/17/21 ?8101 09/18/21 ?7510 09/19/21 ?0111  ?NA 139 141 141 134*  ?K 3.9 4.0 4.2 3.4*  ?CL 100 104 104 93*  ?CO2 27 27 25 27   ?GLUCOSE 93 78 95 111*  ?BUN 15 21 35* 17  ?CREATININE 5.77* 7.39* 10.11* 5.65*  ?CALCIUM 8.4* 8.7* 9.0 8.8*  ?MG  --  2.0 2.2 1.9  ?PHOS  --  5.0* 7.1* 3.8  ? ?GFR ?Estimated Creatinine Clearance: 11.4 mL/min (A) (by C-G formula based on SCr of 5.65 mg/dL (H)). ?Liver Function Tests: ?Recent Labs  ?Lab 09/16/21 ?1205 09/17/21 ?2585  ?AST 16 17  ?ALT 6 6  ?ALKPHOS 55 59  ?BILITOT 0.5 0.3  ?PROT 7.6 7.1  ?ALBUMIN 3.2* 3.3*  ? ?No results for input(s): LIPASE, AMYLASE in the last 168 hours. ?Recent Labs  ?Lab 09/16/21 ?1021 09/18/21 ?0955  ?AMMONIA 21 20  ? ?Coagulation profile ?Recent Labs  ?Lab 09/17/21 ?2254  ?INR 1.9*  ? ? ?CBC: ?Recent Labs   ?Lab 09/16/21 ?0955 09/17/21 ?2778 09/18/21 ?2423 09/19/21 ?0111  ?WBC 15.2* 10.0 7.8 10.4  ?NEUTROABS  --  6.9  --   --   ?HGB 10.4* 9.1* 8.9* 9.8*  ?HCT 36.2* 32.0* 31.4* 32.9*  ?MCV 101.1* 102.6* 101.9* 99.1  ?PLT 302 248 251 236  ? ?Cardiac Enzymes: ?No results for input(s): CKTOTAL, CKMB, CKMBINDEX, TROPONINI in the last 168 hours. ?BNP (last 3 results) ?No results for input(s): PROBNP in  the last 8760 hours. ?CBG: ?Recent Labs  ?Lab 09/18/21 ?1936 09/18/21 ?2345 09/19/21 ?0310 09/19/21 ?1751 09/19/21 ?1105  ?GLUCAP 106* 107* 104* 114* 109*  ? ?D-Dimer: ?No results for input(s): DDIMER in the last 72 hours. ?Hgb A1c: ?No results for input(s): HGBA1C in the last 72 hours. ?Lipid Profile: ?No results for input(s): CHOL, HDL, LDLCALC, TRIG, CHOLHDL, LDLDIRECT in the last 72 hours. ?Thyroid function studies: ?No results for input(s): TSH, T4TOTAL, T3FREE, THYROIDAB in the last 72 hours. ? ?Invalid input(s): FREET3 ? ?Anemia work up: ?Recent Labs  ?  09/18/21 ?1047  ?VITAMINB12 565  ? ?Sepsis Labs: ?Recent Labs  ?Lab 09/16/21 ?0258 09/16/21 ?1205 09/16/21 ?1259 09/17/21 ?5277 09/17/21 ?1004 09/17/21 ?1355 09/18/21 ?8242 09/19/21 ?0111  ?PROCALCITON  --  1.39  --  1.57  --   --   --   --   ?WBC 15.2*  --   --  10.0  --   --  7.8 10.4  ?LATICACIDVEN  --   --  1.1  --  1.2 1.3  --   --   ? ? ?Microbiology ?Recent Results (from the past 240 hour(s))  ?Blood culture (routine x 2)     Status: None (Preliminary result)  ? Collection Time: 09/16/21 12:59 PM  ? Specimen: BLOOD  ?Result Value Ref Range Status  ? Specimen Description BLOOD RIGHT FOREARM  Final  ? Special Requests BOTTLES DRAWN AEROBIC AND ANAEROBIC BCLV  Final  ? Culture   Final  ?  NO GROWTH 4 DAYS ?Performed at [facility], [address], [place], [place] [address] ?  ? Report Status PENDING  Incomplete  ?Resp Panel by RT-PCR (Flu A&B, Covid) Nasopharyngeal Swab     Status: None  ? Collection Time: 09/16/21 12:59 PM  ? Specimen: Nasopharyngeal  Swab; Nasopharyngeal(NP) swabs in vial transport medium  ?Result Value Ref Range Status  ? SARS Coronavirus 2 by RT PCR NEGATIVE NEGATIVE Final  ?  Comment: (NOTE) ?SARS-CoV-2 target nucleic acids are NOT DET

## 2021-09-20 NOTE — Progress Notes (Addendum)
[facility] Liaison Note: ? ?New referral for [facility] home in [place] received from attending physician Dr. Mal Misty. TOC Elena notified. ? ?Writer has spoken  to patient's [facility]CP[facility] [name] to confirm interest. ?Patient information to be reviewed by hospice physician Dr. Lyman Speller, hospice home eligibility pending. ? ?Liaison to update both HCPOA and hospice care team when eligibility has been confirmed. ? ?Thank you for the opportunity to be involved in the care of this patient. ? ?UPDATE: Hospice home eligibility has been confirmed. [facility]CP[name] has accepted the bed. ?Plan is for transfer to the hospice home tomorrow 5/16 pending patient's stability for transfer. ?Hospital care team updated. ? ?Flo Shanks BSN, RN, [facility] ?Hospital Liaison ?Manufacturing engineer ?Hospital Liaison ?[phone] ?

## 2021-09-20 NOTE — Progress Notes (Signed)
Nutrition Brief Note  Chart reviewed. Pt now transitioning to comfort care.  No further nutrition interventions planned at this time.  Please re-consult as needed.   Asia Dusenbury W, RD, LDN, CDCES Registered Dietitian II Certified Diabetes Care and Education Specialist Please refer to AMION for RD and/or RD on-call/weekend/after hours pager   

## 2021-09-20 NOTE — Plan of Care (Signed)
  Problem: Pain Managment: Goal: General experience of comfort will improve Outcome: Progressing   

## 2021-09-21 DIAGNOSIS — L89620 Pressure ulcer of left heel, unstageable: Secondary | ICD-10-CM

## 2021-09-21 NOTE — Progress Notes (Signed)
[facility]  ? ?Consent forms have been completed. ? ?EMS notified of patient D/C and transport arranged. TOC/ and Attending Physician/ also notified of transport arrangement.  ?  ?Please send signed DNR form with patient and RN call report to [phone].  ?  ?Daphene Calamity, MSW ?[place] ?[phone] ? ?

## 2021-09-21 NOTE — Progress Notes (Signed)
Report called to stephanie at hospice facility. PIV remained per stephanie as they will need it for medications. AVS in patients chart and sent with patient. ?
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
## 2021-09-22 LAB — METHYLMALONIC ACID, SERUM: Methylmalonic Acid, Quantitative: 467 nmol/L — ABNORMAL HIGH (ref 0–378)

## 2021-09-23 LAB — VITAMIN B1

## 2021-09-23 LAB — CULTURE, BLOOD (ROUTINE X 2)
Culture: NO GROWTH
Culture: NO GROWTH

## 2021-10-07 DEATH — deceased

## 2021-11-06 DEATH — deceased
